# Patient Record
Sex: Male | Born: 1965 | Race: White | Hispanic: No | Smoking: Former smoker
Health system: Southern US, Community
[De-identification: ages and names within clinical notes are randomized; demographics above are authoritative.]

## PROBLEM LIST (undated history)

## (undated) ENCOUNTER — Emergency Department (HOSPITAL_COMMUNITY): Disposition: A | Discharge status: Home / Self Care | Payer: Medicare Other

## (undated) DIAGNOSIS — F419 Anxiety disorder, unspecified: Secondary | ICD-10-CM

## (undated) DIAGNOSIS — T148XXA Other injury of unspecified body region, initial encounter: Secondary | ICD-10-CM

## (undated) DIAGNOSIS — L02619 Cutaneous abscess of unspecified foot: Secondary | ICD-10-CM

## (undated) DIAGNOSIS — L03119 Cellulitis of unspecified part of limb: Secondary | ICD-10-CM

## (undated) DIAGNOSIS — Z87442 Personal history of urinary calculi: Secondary | ICD-10-CM

## (undated) DIAGNOSIS — F99 Mental disorder, not otherwise specified: Secondary | ICD-10-CM

## (undated) DIAGNOSIS — Z9289 Personal history of other medical treatment: Secondary | ICD-10-CM

## (undated) DIAGNOSIS — G709 Myoneural disorder, unspecified: Secondary | ICD-10-CM

## (undated) DIAGNOSIS — IMO0002 Reserved for concepts with insufficient information to code with codable children: Secondary | ICD-10-CM

## (undated) DIAGNOSIS — Z89421 Acquired absence of other right toe(s): Secondary | ICD-10-CM

## (undated) DIAGNOSIS — J189 Pneumonia, unspecified organism: Secondary | ICD-10-CM

## (undated) DIAGNOSIS — F1910 Other psychoactive substance abuse, uncomplicated: Secondary | ICD-10-CM

## (undated) DIAGNOSIS — F1010 Alcohol abuse, uncomplicated: Secondary | ICD-10-CM

## (undated) DIAGNOSIS — Z91199 Patient's noncompliance with other medical treatment and regimen due to unspecified reason: Secondary | ICD-10-CM

## (undated) DIAGNOSIS — L03115 Cellulitis of right lower limb: Secondary | ICD-10-CM

## (undated) DIAGNOSIS — K5900 Constipation, unspecified: Secondary | ICD-10-CM

## (undated) DIAGNOSIS — F329 Major depressive disorder, single episode, unspecified: Secondary | ICD-10-CM

## (undated) DIAGNOSIS — K08109 Complete loss of teeth, unspecified cause, unspecified class: Secondary | ICD-10-CM

## (undated) DIAGNOSIS — C801 Malignant (primary) neoplasm, unspecified: Secondary | ICD-10-CM

## (undated) DIAGNOSIS — L97509 Non-pressure chronic ulcer of other part of unspecified foot with unspecified severity: Secondary | ICD-10-CM

## (undated) DIAGNOSIS — M1990 Unspecified osteoarthritis, unspecified site: Secondary | ICD-10-CM

## (undated) DIAGNOSIS — Z972 Presence of dental prosthetic device (complete) (partial): Secondary | ICD-10-CM

## (undated) DIAGNOSIS — M869 Osteomyelitis, unspecified: Secondary | ICD-10-CM

## (undated) DIAGNOSIS — Z22322 Carrier or suspected carrier of Methicillin resistant Staphylococcus aureus: Secondary | ICD-10-CM

## (undated) DIAGNOSIS — I639 Cerebral infarction, unspecified: Secondary | ICD-10-CM

## (undated) DIAGNOSIS — Z973 Presence of spectacles and contact lenses: Secondary | ICD-10-CM

## (undated) DIAGNOSIS — F32A Depression, unspecified: Secondary | ICD-10-CM

## (undated) DIAGNOSIS — G8929 Other chronic pain: Secondary | ICD-10-CM

## (undated) DIAGNOSIS — D649 Anemia, unspecified: Secondary | ICD-10-CM

## (undated) DIAGNOSIS — E119 Type 2 diabetes mellitus without complications: Secondary | ICD-10-CM

## (undated) DIAGNOSIS — K219 Gastro-esophageal reflux disease without esophagitis: Secondary | ICD-10-CM

## (undated) DIAGNOSIS — E11621 Type 2 diabetes mellitus with foot ulcer: Secondary | ICD-10-CM

## (undated) DIAGNOSIS — Z9119 Patient's noncompliance with other medical treatment and regimen: Secondary | ICD-10-CM

## (undated) HISTORY — PX: TOTAL LARYNGECTOMY: SHX2543

## (undated) HISTORY — PX: MULTIPLE TOOTH EXTRACTIONS: SHX2053

## (undated) HISTORY — PX: THROAT SURGERY: SHX803

## (undated) HISTORY — PX: JOINT REPLACEMENT: SHX530

## (undated) HISTORY — PX: LUNG LOBECTOMY: SHX167

## (undated) HISTORY — DX: Cellulitis of right lower limb: L03.115

## (undated) HISTORY — DX: Osteomyelitis, unspecified: M86.9

## (undated) HISTORY — PX: KNEE ARTHROSCOPY: SHX127

## (undated) HISTORY — PX: TONGUE SURGERY: SHX810

---
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
## 2001-12-29 ENCOUNTER — Emergency Department (HOSPITAL_COMMUNITY): Admission: EM | Admit: 2001-12-29 | Discharge: 2001-12-29 | Payer: Self-pay | Admitting: Internal Medicine

## 2001-12-29 ENCOUNTER — Encounter: Payer: Self-pay | Admitting: Internal Medicine

## 2003-01-04 ENCOUNTER — Ambulatory Visit (HOSPITAL_COMMUNITY): Admission: RE | Admit: 2003-01-04 | Discharge: 2003-01-04 | Payer: Self-pay | Admitting: Preventative Medicine

## 2003-01-16 ENCOUNTER — Ambulatory Visit (HOSPITAL_COMMUNITY): Admission: RE | Admit: 2003-01-16 | Discharge: 2003-01-16 | Payer: Self-pay | Admitting: Orthopaedic Surgery

## 2004-01-20 HISTORY — PX: TOTAL KNEE ARTHROPLASTY: SHX125

## 2004-05-19 ENCOUNTER — Ambulatory Visit (HOSPITAL_COMMUNITY): Admission: RE | Admit: 2004-05-19 | Discharge: 2004-05-19 | Payer: Self-pay | Admitting: Orthopaedic Surgery

## 2004-08-05 ENCOUNTER — Encounter (HOSPITAL_COMMUNITY): Admission: RE | Admit: 2004-08-05 | Discharge: 2004-09-04 | Payer: Self-pay | Admitting: Orthopedic Surgery

## 2005-01-02 ENCOUNTER — Emergency Department (HOSPITAL_COMMUNITY): Admission: EM | Admit: 2005-01-02 | Discharge: 2005-01-02 | Payer: Self-pay | Admitting: Emergency Medicine

## 2005-01-06 ENCOUNTER — Ambulatory Visit (HOSPITAL_COMMUNITY): Admission: RE | Admit: 2005-01-06 | Discharge: 2005-01-06 | Payer: Self-pay | Admitting: Orthopaedic Surgery

## 2005-05-30 IMAGING — MR MR [PERSON_NAME] LOW JT W/O CM*L*
9 series · 40 of 40 positions shown · non-contrast
Comparison: none

CLINICAL DATA: Pain, injury.
 MRI LEFT KNEE WITHOUT CONTRAST
 Multiplanar non-contrast MR imaging of the right knee without radiographs for correlation.  Motion artifacts degrade image quality on multiple sequences, most notably coronal proton density.  Small joint effusion with no evidence of Baker?s cyst.  Diffuse articular cartilage thinning and irregularity, particularly at the medial and lateral compartments, compatible with osteoarthritis.  A small calcified loose body is seen anteriorly at lateral compartment, 1.7 x 1.2 cm x 1.6 cm in size.  Particular spur formation noted at the posterior and lateral aspects of the lateral compartment.  Question additional partially calcified body at the posterior medial aspect of the previously noted large anterior loose body at lateral compartment.  Potentially, this could represent a meniscal fragment.  
 Medial meniscus intact with degenerative intrameniscal signal noted in the posterior horn.  Posterior horn of lateral meniscus is identified but subluxed from the joint space.  The body and anterior horn are not identified and there is question of a flipped fragment in the posterior aspect of the intercondylar notch.  Marrow edema noted centrally in the tibia.  Patellar articular cartilage preserved.  Cruciate ligaments intact.  Lateral collateral ligament questionably torn at its tibial insertion.  Medial collateral ligaments intact.  Extensor mechanism unremarkable.  
 IMPRESSION
 1.  Advanced osteoarthritic changes, left knee, most severe at lateral compartment.  Torn lateral meniscus with absent anterior horn and body, question flipped fragment into intercondylar notch.  Two probable calcified loose bodies anterior aspect of the lateral compartment.  Probably torn lateral collateral ligament distally.  
 2.  Limited exam secondary to patient motion despite repeat imaging.

[Series 1: loc · axial · 6.0mm · 1.02mm/px · z∈[-16,+130]mm · 4 of 15 slices shown]
[im 1/15]
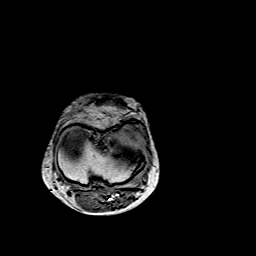
[im 5/15]
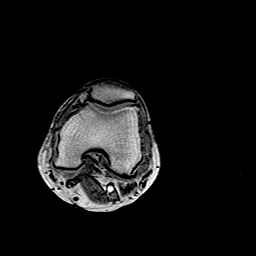
[im 10/15]
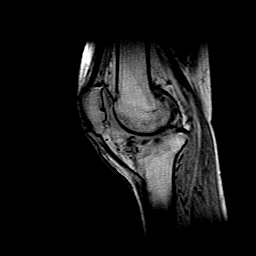
[im 15/15]
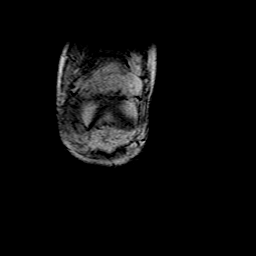

[Series 2: PD fat-sat · axial · 4.0mm · 0.63mm/px · z∈[-43,+47]mm · 5 of 19 slices shown (1 of 3)]
[im 1/19]
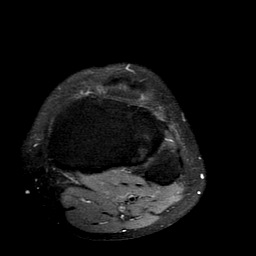
[im 5/19]
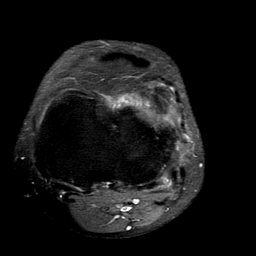
[im 10/19]
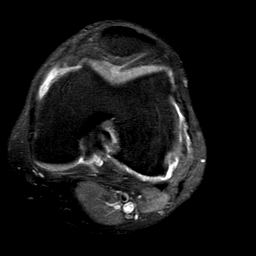
[im 14/19]
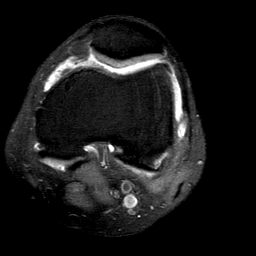
[im 19/19]
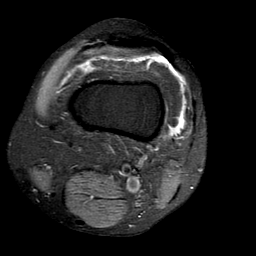

[Series 3: PD · sagittal · 3.0mm · 0.62mm/px · 2 of 12 slices shown (1 of 2)]
[im 1/12]
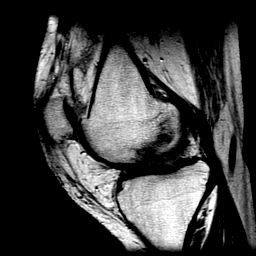
[im 12/12]
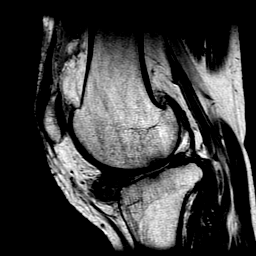

[Series 4: PD · sagittal · 4.0mm · 0.63mm/px · 9 of 44 slices shown (2 of 2)]
[im 1/44]
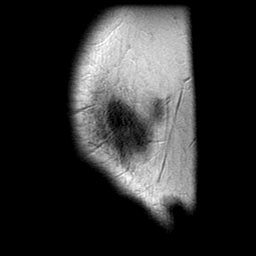
[im 6/44]
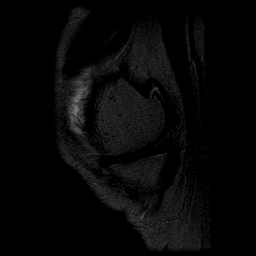
[im 11/44]
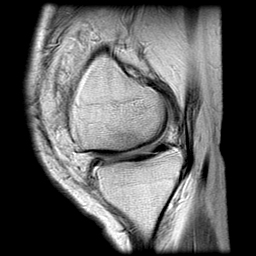
[im 17/44]
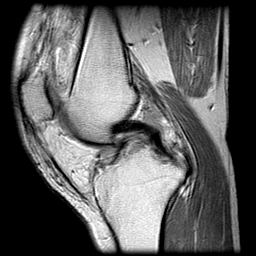
[im 22/44]
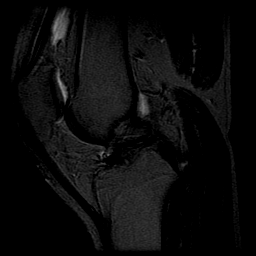
[im 27/44]
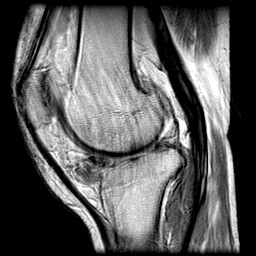
[im 33/44]
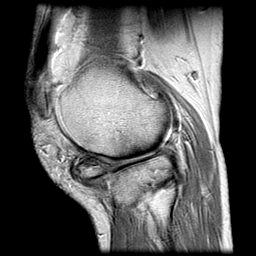
[im 38/44]
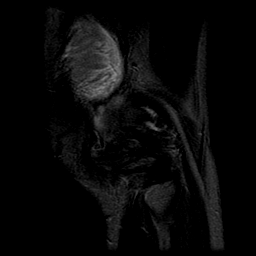
[im 44/44]
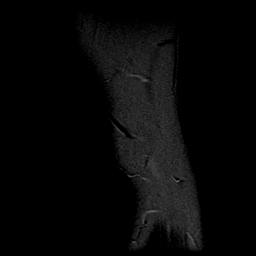

[Series 5: T2 fat-sat · coronal · 4.0mm · 0.63mm/px · 4 of 21 slices shown]
[im 1/21]
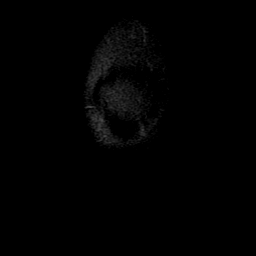
[im 7/21]
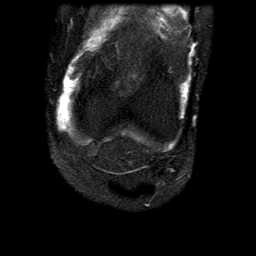
[im 14/21]
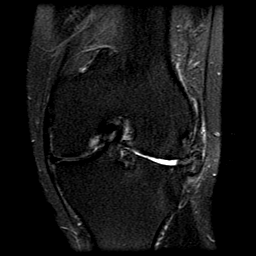
[im 21/21]
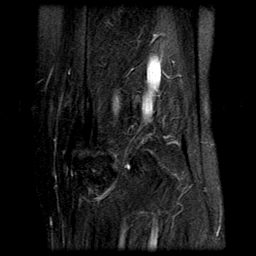

[Series 6: PD fat-sat · coronal · 4.0mm · 0.63mm/px · 4 of 21 slices shown (2 of 3)]
[im 1/21]
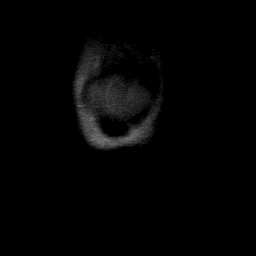
[im 7/21]
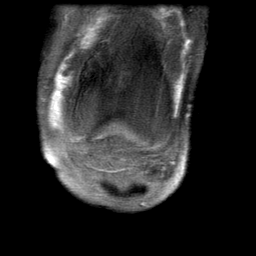
[im 14/21]
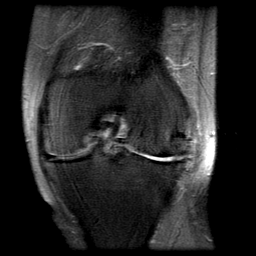
[im 21/21]
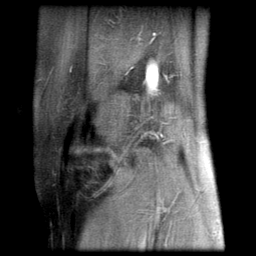

[Series 7: PD fat-sat · coronal · 4.0mm · 0.63mm/px · 4 of 21 slices shown (3 of 3)]
[im 1/21]
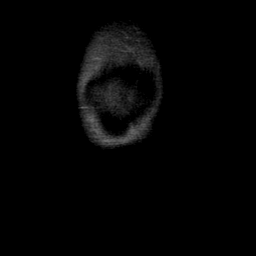
[im 7/21]
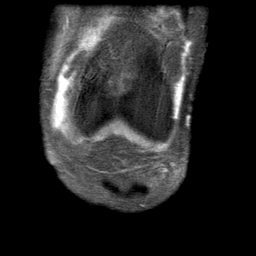
[im 14/21]
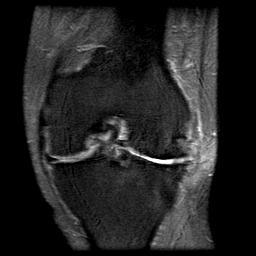
[im 21/21]
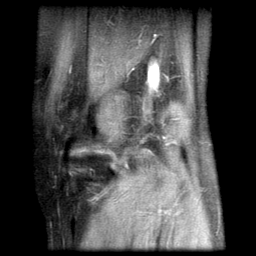

[Series 8: T1 · coronal · 4.0mm · 0.63mm/px · 4 of 21 slices shown (1 of 2)]
[im 1/21]
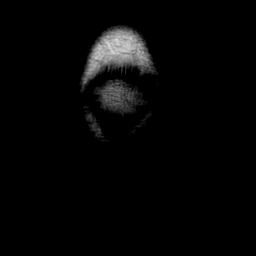
[im 7/21]
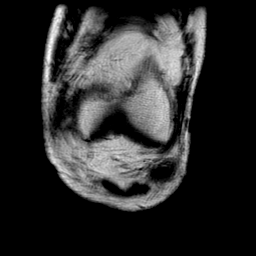
[im 14/21]
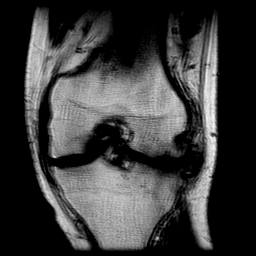
[im 21/21]
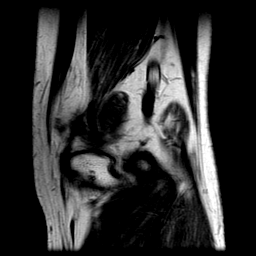

[Series 9: T1 · coronal · 4.0mm · 0.63mm/px · 4 of 21 slices shown (2 of 2)]
[im 1/21]
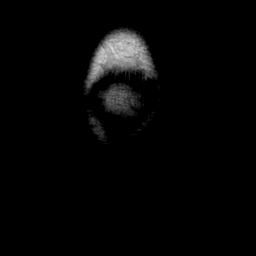
[im 7/21]
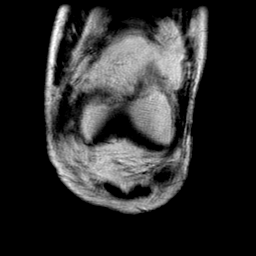
[im 14/21]
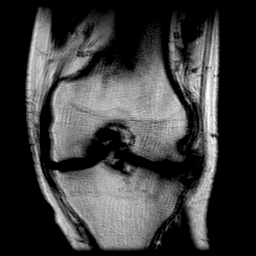
[im 21/21]
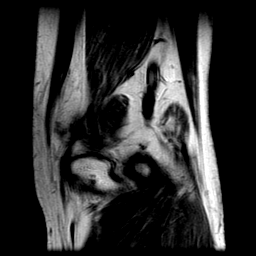

[40 of 40 positions shown; findings below may reference images not displayed]

## 2005-06-25 ENCOUNTER — Ambulatory Visit: Payer: Self-pay | Admitting: Cardiology

## 2005-07-08 ENCOUNTER — Inpatient Hospital Stay (HOSPITAL_COMMUNITY): Admission: AD | Admit: 2005-07-08 | Discharge: 2005-07-15 | Payer: Self-pay | Admitting: Thoracic Surgery

## 2005-07-09 ENCOUNTER — Encounter (INDEPENDENT_AMBULATORY_CARE_PROVIDER_SITE_OTHER): Payer: Self-pay | Admitting: Specialist

## 2005-07-21 ENCOUNTER — Encounter: Admission: RE | Admit: 2005-07-21 | Discharge: 2005-07-21 | Payer: Self-pay | Admitting: Thoracic Surgery

## 2005-08-12 ENCOUNTER — Encounter: Admission: RE | Admit: 2005-08-12 | Discharge: 2005-08-12 | Payer: Self-pay | Admitting: Thoracic Surgery

## 2005-10-14 ENCOUNTER — Encounter: Admission: RE | Admit: 2005-10-14 | Discharge: 2005-10-14 | Payer: Self-pay | Admitting: Thoracic Surgery

## 2005-11-17 ENCOUNTER — Ambulatory Visit (HOSPITAL_COMMUNITY): Admission: RE | Admit: 2005-11-17 | Discharge: 2005-11-17 | Payer: Self-pay | Admitting: Orthopaedic Surgery

## 2005-11-17 ENCOUNTER — Encounter (INDEPENDENT_AMBULATORY_CARE_PROVIDER_SITE_OTHER): Payer: Self-pay | Admitting: *Deleted

## 2006-01-19 DIAGNOSIS — I639 Cerebral infarction, unspecified: Secondary | ICD-10-CM

## 2006-01-19 HISTORY — DX: Cerebral infarction, unspecified: I63.9

## 2006-01-19 HISTORY — PX: TOTAL HIP ARTHROPLASTY: SHX124

## 2006-01-19 HISTORY — PX: REVISION TOTAL HIP ARTHROPLASTY: SHX766

## 2006-06-11 ENCOUNTER — Ambulatory Visit: Payer: Self-pay | Admitting: Orthopedic Surgery

## 2006-06-11 ENCOUNTER — Emergency Department (HOSPITAL_COMMUNITY): Admission: EM | Admit: 2006-06-11 | Discharge: 2006-06-11 | Payer: Self-pay | Admitting: Emergency Medicine

## 2006-06-18 ENCOUNTER — Ambulatory Visit (HOSPITAL_BASED_OUTPATIENT_CLINIC_OR_DEPARTMENT_OTHER): Admission: RE | Admit: 2006-06-18 | Discharge: 2006-06-18 | Payer: Self-pay | Admitting: Otolaryngology

## 2006-06-18 ENCOUNTER — Encounter (INDEPENDENT_AMBULATORY_CARE_PROVIDER_SITE_OTHER): Payer: Self-pay | Admitting: Otolaryngology

## 2006-07-10 ENCOUNTER — Emergency Department (HOSPITAL_COMMUNITY): Admission: EM | Admit: 2006-07-10 | Discharge: 2006-07-11 | Payer: Self-pay | Admitting: Emergency Medicine

## 2006-08-16 ENCOUNTER — Emergency Department (HOSPITAL_COMMUNITY): Admission: EM | Admit: 2006-08-16 | Discharge: 2006-08-16 | Payer: Self-pay | Admitting: Emergency Medicine

## 2006-08-16 ENCOUNTER — Inpatient Hospital Stay (HOSPITAL_COMMUNITY): Admission: RE | Admit: 2006-08-16 | Discharge: 2006-08-20 | Payer: Self-pay | Admitting: Psychiatry

## 2006-08-16 ENCOUNTER — Ambulatory Visit: Payer: Self-pay | Admitting: Psychiatry

## 2006-08-31 ENCOUNTER — Emergency Department (HOSPITAL_COMMUNITY): Admission: EM | Admit: 2006-08-31 | Discharge: 2006-08-31 | Payer: Self-pay | Admitting: Emergency Medicine

## 2007-02-01 IMAGING — MR MR [PERSON_NAME] LOW JT W/O CM*L*
6 of 8 series · 28 of 40 positions shown · non-contrast
Comparison: none

CLINICAL DATA: Left knee pain and swelling.  History of prior arthroscopic surgery approximately 25 years ago.  Reportedly the patient fell off of a ladder approximately 10 days ago and has medial and lateral joint line pain.
MRI OF THE LEFT KNEE WITHOUT CONTRAST MEDIA:
Standard high field strength images were acquired and are compared to the 01/04/03 scan from [HOSPITAL].
Large knee joint effusion.  Lobulated collection of fluid in the popliteal space just posterior to the PCL.  This is not the typical location of a Baker's cyst.  Severe cartilage narrowing of the lateral tibiofemoral compartment with subchondral osseous changes including sclerosis.  Slight bony irregularity and prominent ?kissing? osteophytes laterally.  Irregularity of the base of the tibial eminence raises the question of injury at that site.  However, a similar appearance was noted previously.  Previous images were somewhat image-degraded as are some of the images today, therefore it is difficult to make a direct one-to-one comparison.  I suspect that the patient had a prior fracture of the base of the anterior tibial spine however, I am concerned that there is a greater degree of marrow edema at that site on today?s examination raising the question of a recent bony injury as well.  
Findings compatible with medial collateral ligament sprain and possibly partial tear at the site of the femoral attachment as there is a small amount of marrow edema of the femur at that site. The lateral collateral ligament is intact.  The fibular collateral ligament is draped over prominent laterally projecting osteophytes off of the femur and tibia.  There are extensive degenerative osteoarthritic changes, particularly in the tibiofemoral compartment with marked cartilage thinning and subchondral bony changes compatible with eburnation.  Degenerative patellofemoral joint changes.  The ACL is intact but there is somewhat attenuated as noted previously.  The PCL appears intact.Suggestion of a subtle contour defect in the anterior margin of the medial aspect of the posterior horn of the medial meniscus which was noted previously.  This probably does not represent an acute tear.  The lateral meniscus is quite diminutive appearing .  The findings in part are likely due to prior meniscectomy.  An apparent remnant of the anterior horn appears to be present.  Again appreciated are findings of an ossified loose body in the anterior aspect of the lateral compartment adjacent to the infrapatellar fat pad.  Surrounding rind of soft tissue material may represent pannus-like reaction.                                                                                                                 When compared to the prior examination, there is a greater degree of marrow signal alteration of the distal femur and proximal tibia, mainly the lateral femoral metaphysis and condyle and proximal tibial metaphysis extending up to the articular eminence.  These would be compatible with bone marrow contusions/bone bruises.  Irregularity involving the anterior aspect of the tibial eminence is noted and was appreciated previously as well.  No definite acute fracture at that site.

[Series 3: PD fat-sat · axial · 4.0mm · 0.36mm/px · z∈[-73,+37]mm · 3 of 24 slices shown (1 of 2)]
[im 1/24]
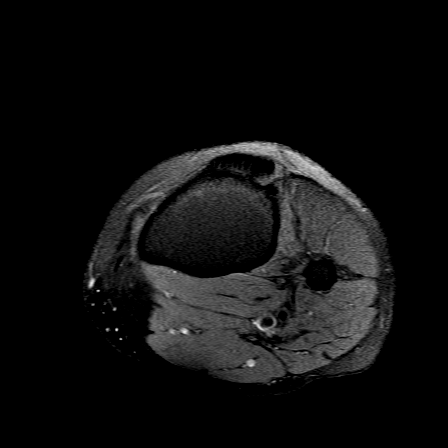
[im 12/24]
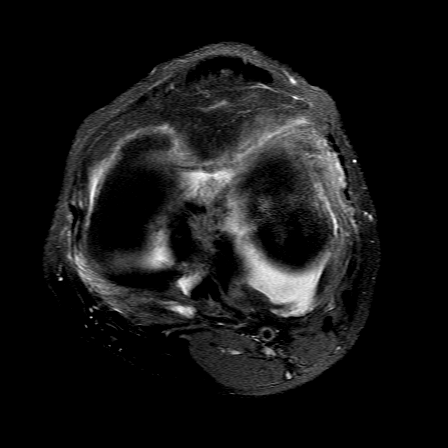
[im 24/24]
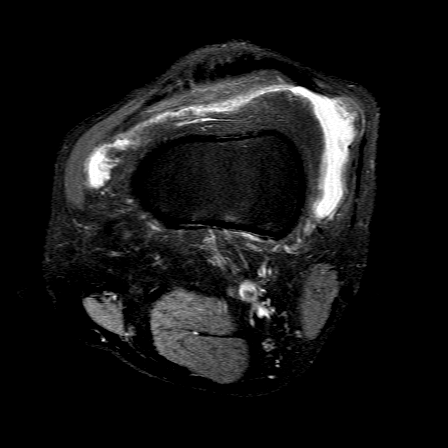

[Series 4: T1 · coronal · 3.0mm · 0.47mm/px · 6 of 36 slices shown]
[im 1/36]
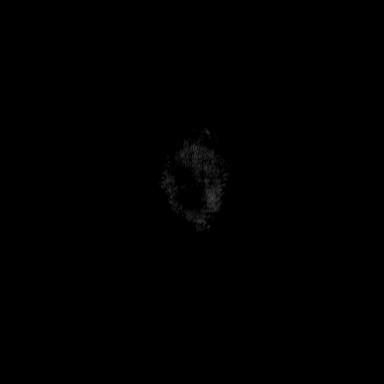
[im 8/36]
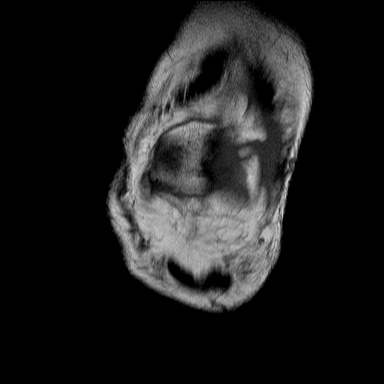
[im 15/36]
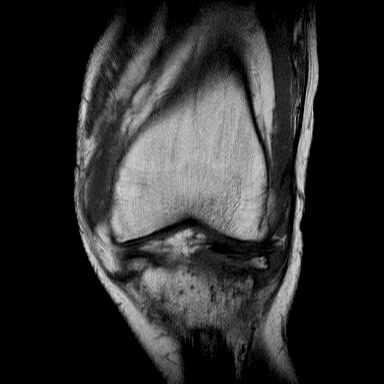
[im 22/36]
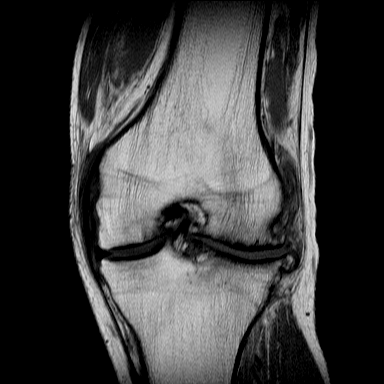
[im 29/36]
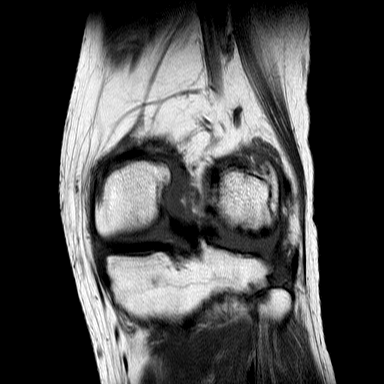
[im 36/36]
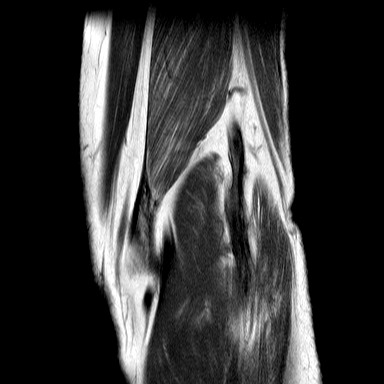

[Series 5: PD fat-sat · coronal · 2.5mm · 0.47mm/px · 7 of 42 slices shown (2 of 2)]
[im 1/42]
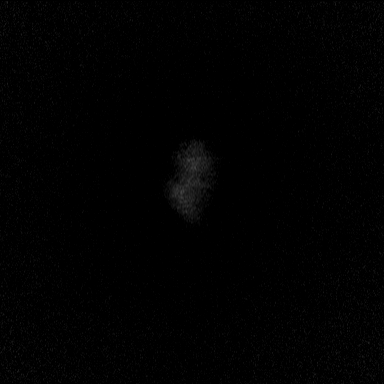
[im 7/42]
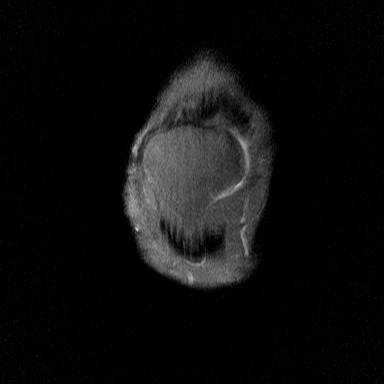
[im 14/42]
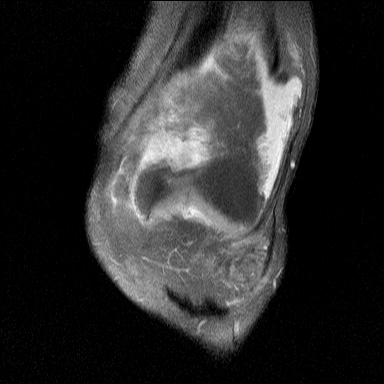
[im 21/42]
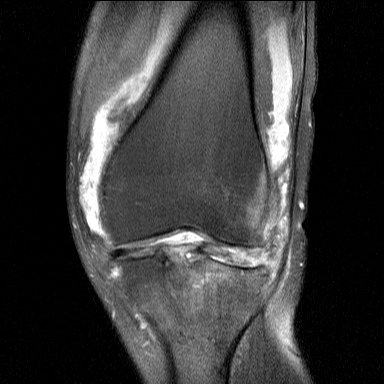
[im 28/42]
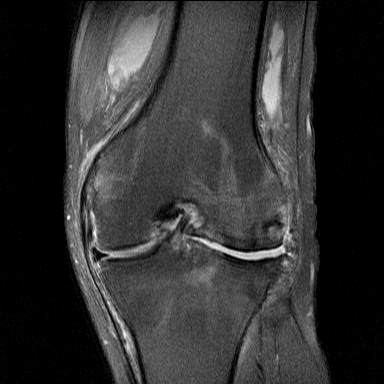
[im 35/42]
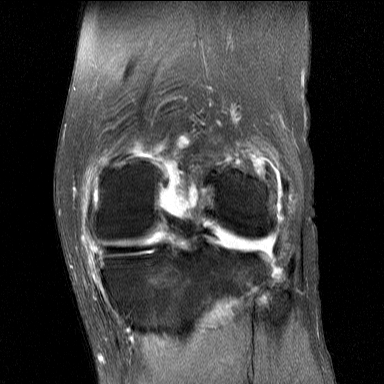
[im 42/42]
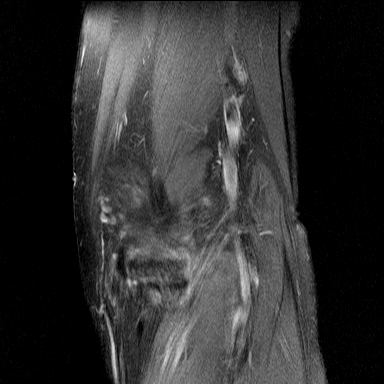

[Series 6: T2 fat-sat · coronal · 3.0mm · 0.47mm/px · 6 of 36 slices shown]
[im 1/36]
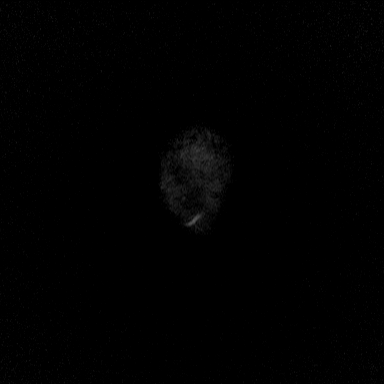
[im 8/36]
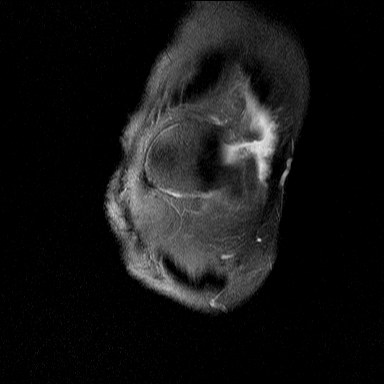
[im 15/36]
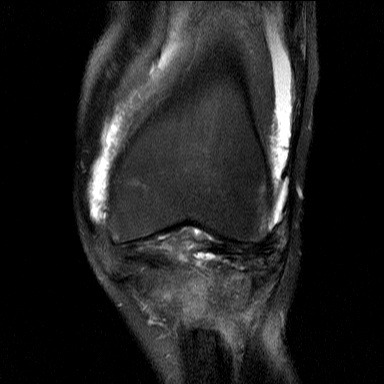
[im 22/36]
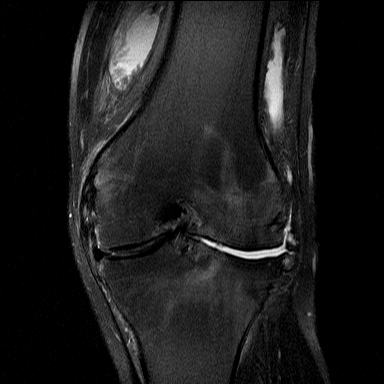
[im 29/36]
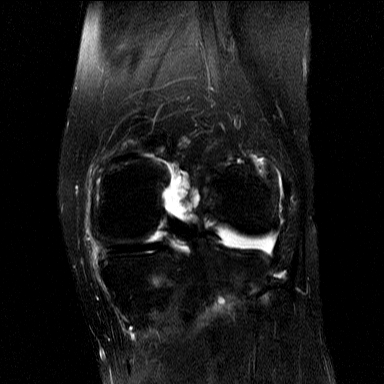
[im 36/36]
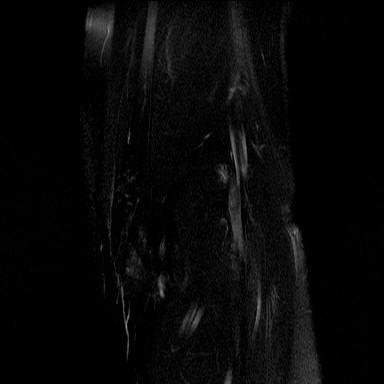

[Series 7: se pdfs sag · sagittal · 4.0mm · 0.59mm/px · 5 of 28 slices shown]
[im 1/28]
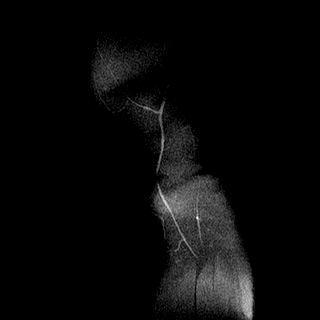
[im 7/28]
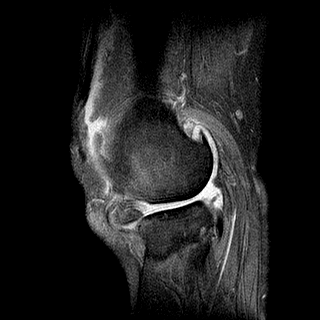
[im 14/28]
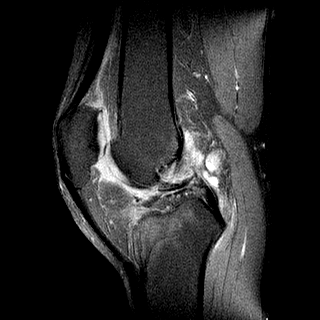
[im 21/28]
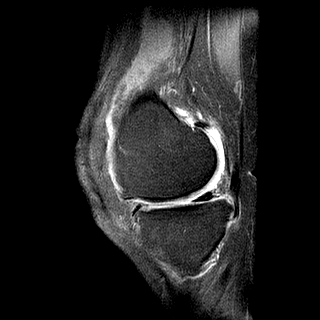
[im 28/28]
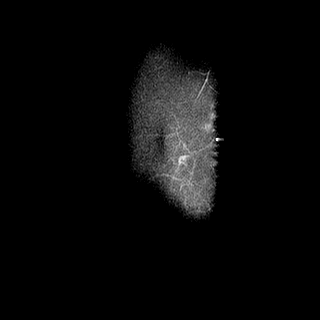

[Series 8: pdfs axial repeat · axial · 4.0mm · 0.24mm/px · 1 of 24 slices shown]
[im 1/24]
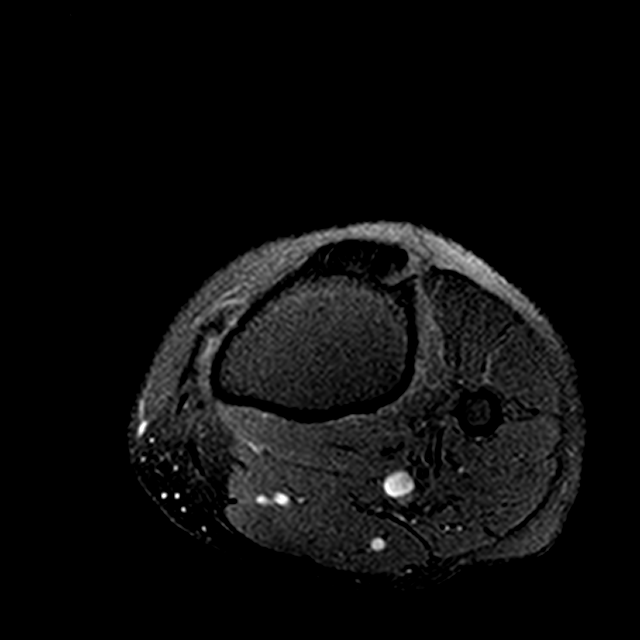

[28 of 40 positions shown; findings below may reference images not displayed]

IMPRESSION: Large knee joint effusion.
Extensive degenerative osteoarthritic changes.
Extensive lateral meniscal changes most likely due to prior subtotal meniscectomy.  It is difficult to rule out acute injury to the lateral meniscus with certainty.  See comments above concerning the medial meniscus.
MCL sprain/partial tear of proximal MCL.
Findings compatible with bone bruise/marrow contusions--see comments above.

## 2007-05-29 IMAGING — CR DG LUMBAR SPINE COMPLETE 4+V
5 series · 5 of 5 positions shown · non-contrast
Comparison: none

CLINICAL DATA: Patient fell from a ladder and now has posterior right hip pain, severe bruising, right lower back pain.  No previous injury. Previous injury of right knee.  Some swelling of the knee.
 LUMBOSACRAL SPINE ? 5   VIEW:

[view not recorded (1 of 5)]
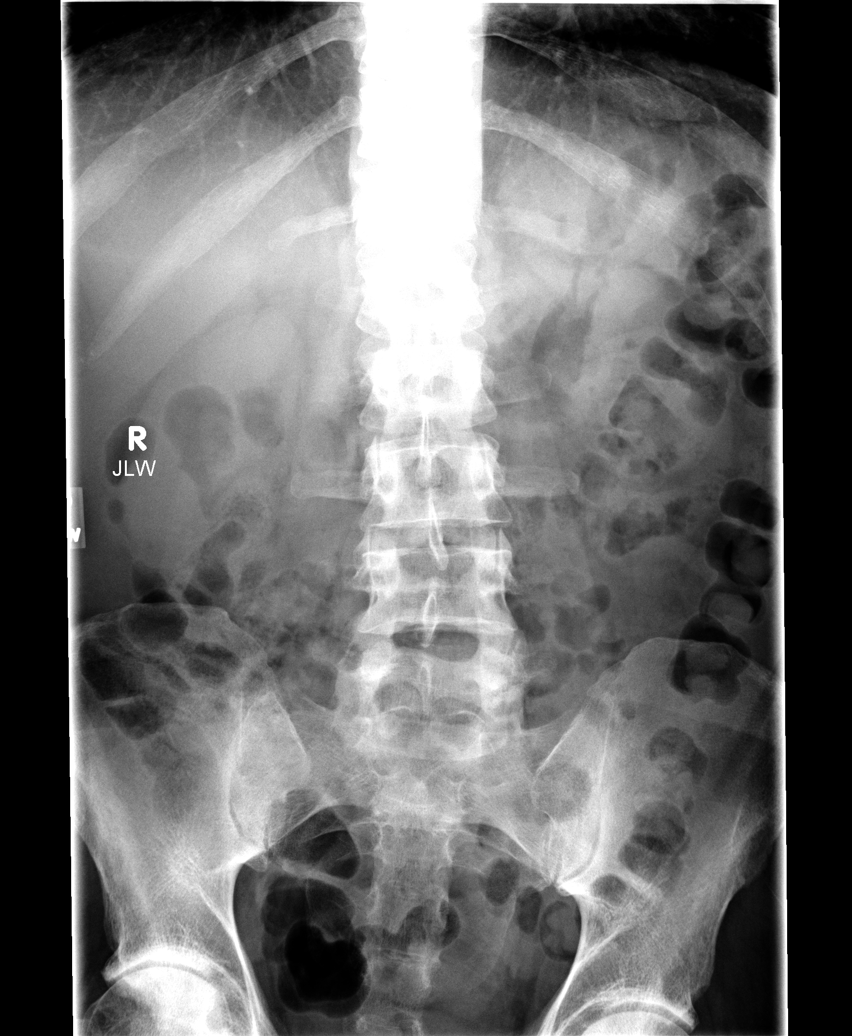

[view not recorded (2 of 5)]
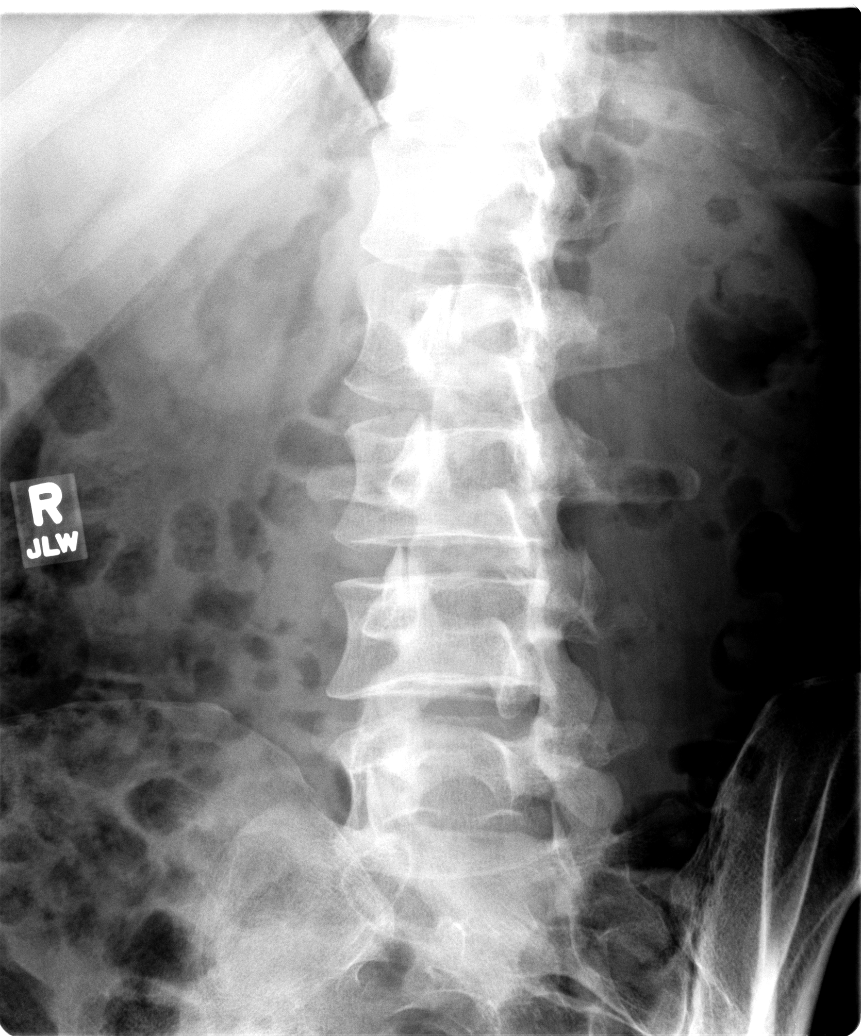

[view not recorded (3 of 5)]
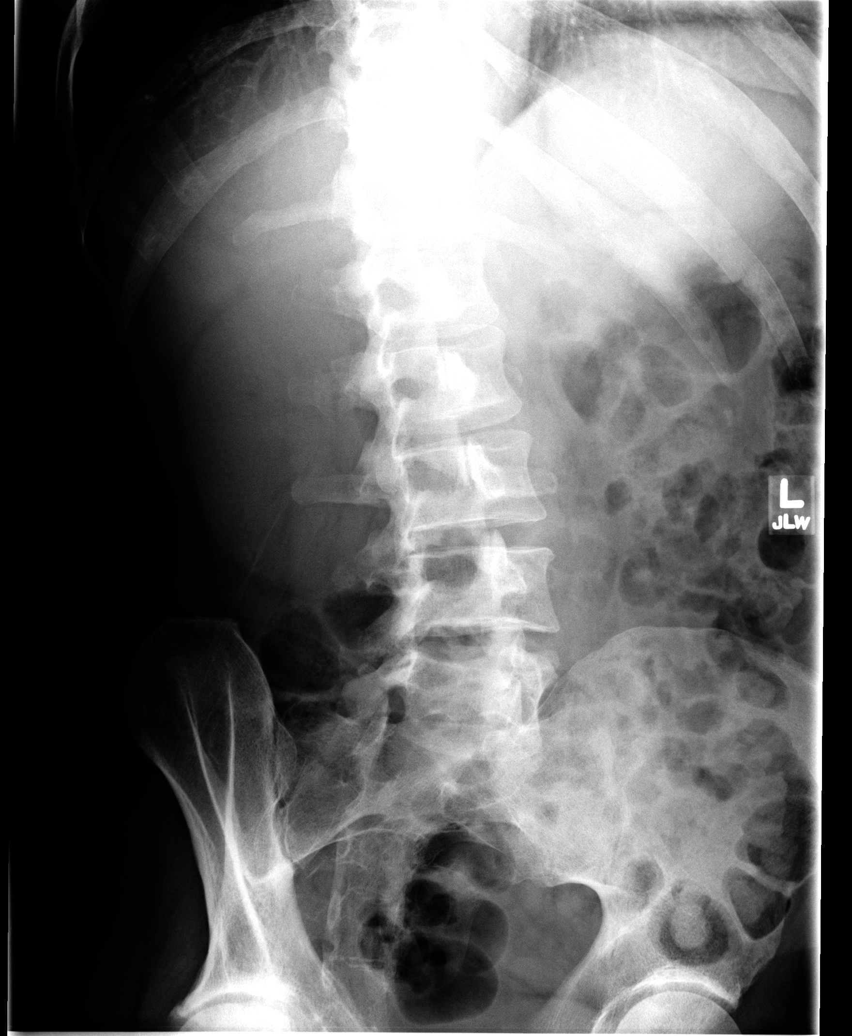

[view not recorded (4 of 5)]
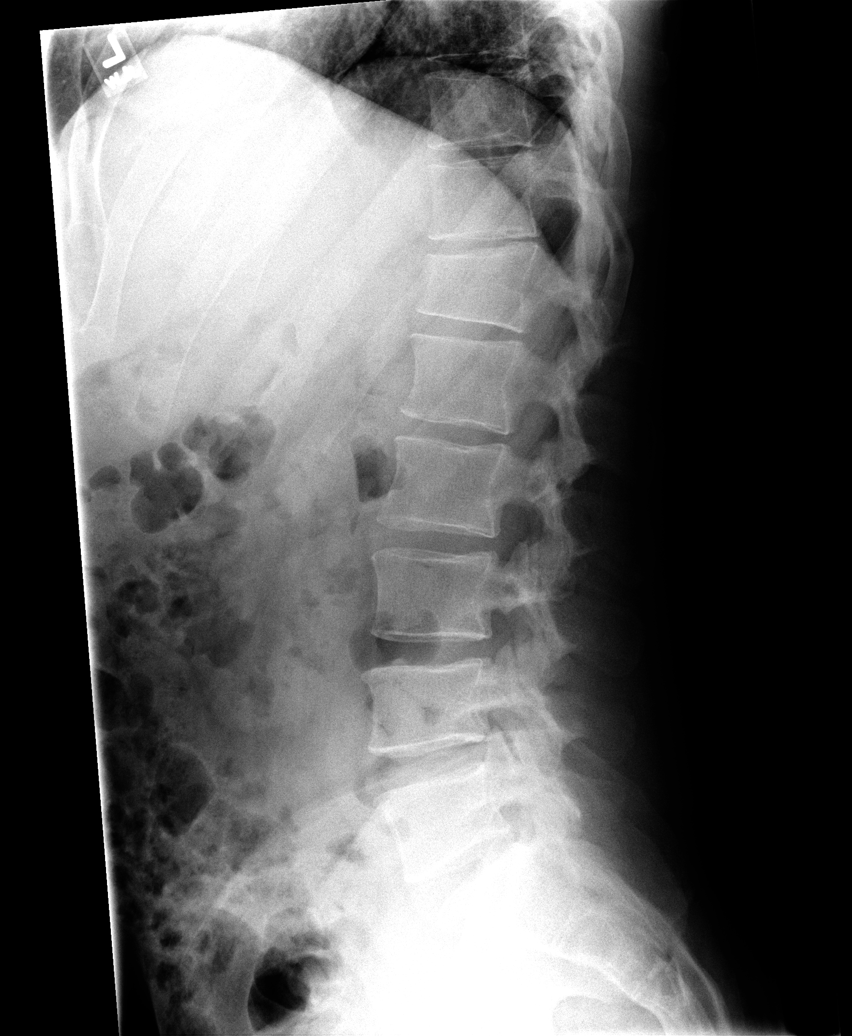

[view not recorded (5 of 5)]
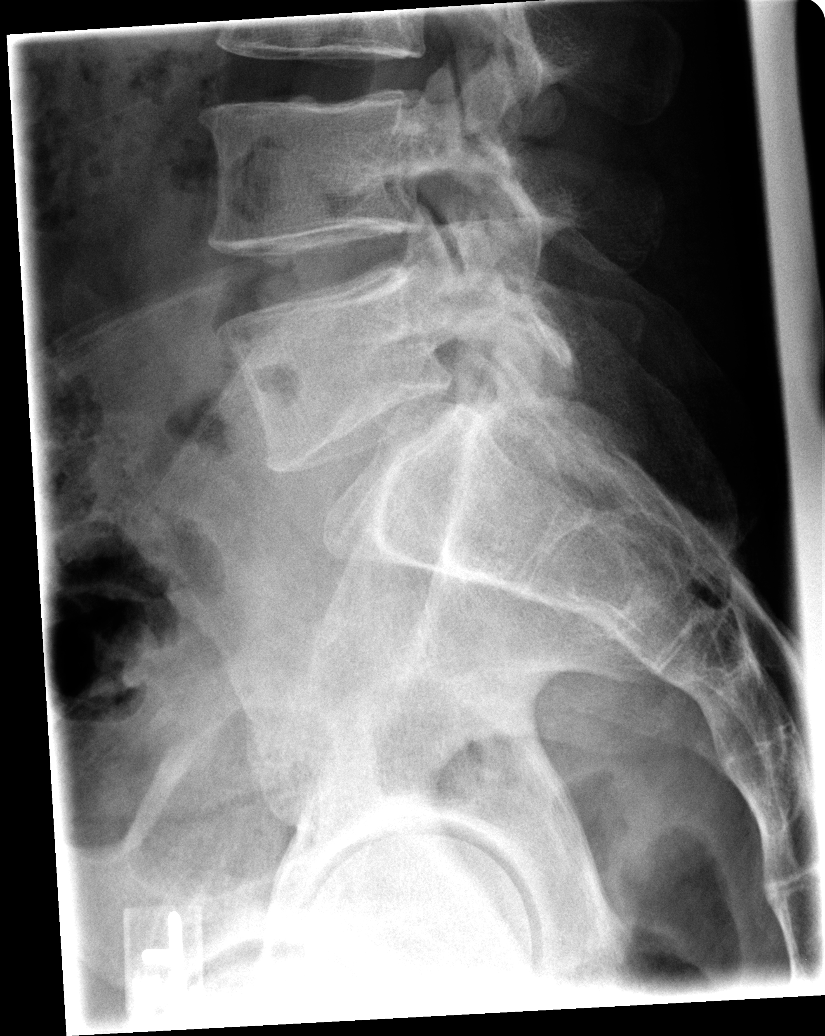

[5 of 5 positions shown; findings below may reference images not displayed]

FINDINGS: Lumbosacral spine views show no fracture or dislocation of the lumbar spine. Intervertebral disk spaces and posterior elements are well maintained.  There is anterior narrowing of T-12 of approximately 20% of uncertain age.  Posterior elements appear to be intact at all levels.  Soft tissues appear normal as far as can be seen.
IMPRESSION: 20% anterior narrowing T-12. Questionable age. 
 RIGHT KNEE ? 4 VIEW:
FINDINGS: AP lateral and both oblique views of the right knee are made without previous films for comparison and show an area of sclerosis associated with the lateral tibial plateau and a minimal irregularity along the anterior aspect of the tibial plateau on the lateral view that could be associated with a minimal subchondral fracture of the lateral tibial plateau.  There is a moderate sized joint effusion and there are old degenerative hypertrophic arthritic changes of the medial and lateral aspects of the knee.
IMPRESSION: Sclerosis associated with the lateral tibial plateau.  I cannot rule out a minimal tibial plateau subchondral fracture.  There is also minimal irregularity associated with the anterior aspect of the lateral view of the tibia and a joint effusion. There are old degenerative hypertrophic arthritic changes associated with both medial and lateral aspects of the knee, more prominent laterally.
IMPRESSION: Sclerosis associated with the medial tibial plateau. Possibility of a subchondral fracture of the lateral tibial plateau should be correlated clinically and the possibility of a CT scan should be considered.
 RIGHT HIP ? 3 VIEW:
FINDINGS: AP view of the pelvis and an AP and lateral view of the right hip are made and show no evidence of acute fracture, dislocation or foreign body. Sacroiliac joints appear normal.
IMPRESSION: Normal right hip.

## 2007-06-02 IMAGING — CT CT EXTREM LOW W/O CM*R*
4 of 7 series · 15 of 33 positions shown, 16 images · IV contrast (agent unspecified)
Comparison: None available.   MRI of the right knee reviewed.

CLINICAL DATA: Patient status-post fall from a ladder on 01/02/05 with questionable fracture.  
CT OF THE LEFT KNEE WITHOUT CONTRAST:
TECHNIQUE: Multidetector CT imaging was performed according to the standard protocol without intravenous contrast administration.  Multiplanar CT image reconstructions were also generated.

[Series 8329: — · axial · 0.40mm/px · z∈[+960,+1050]mm · 4 of 251 slices shown, 5 images (1 of 4)]
[im 51/251  soft-tissue]
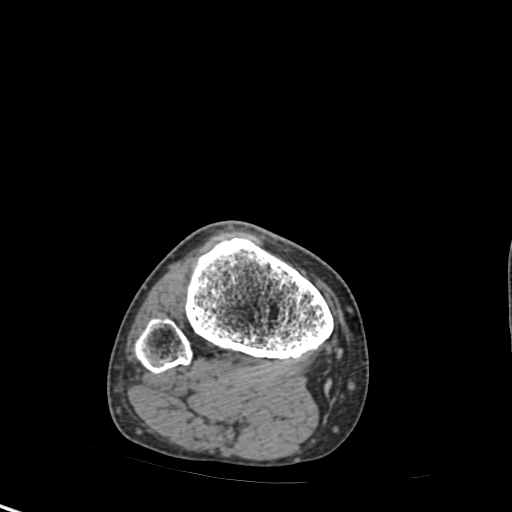
[im 51/251  bone]
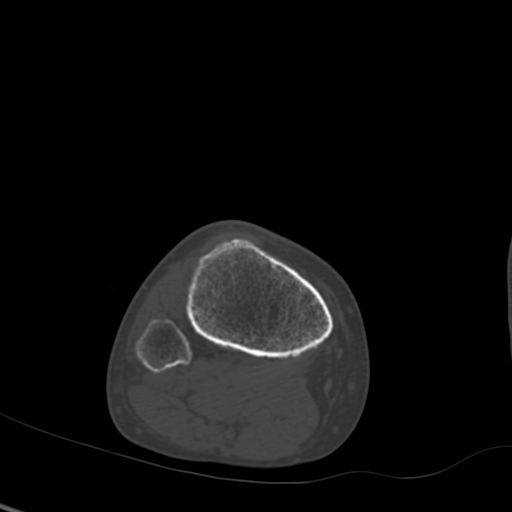
[im 101/251  bone]
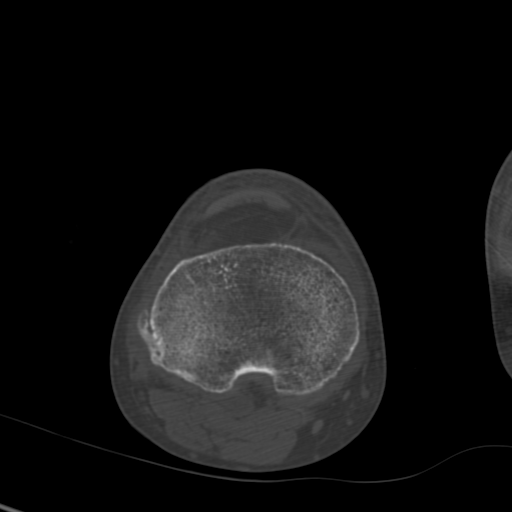
[im 151/251  bone]
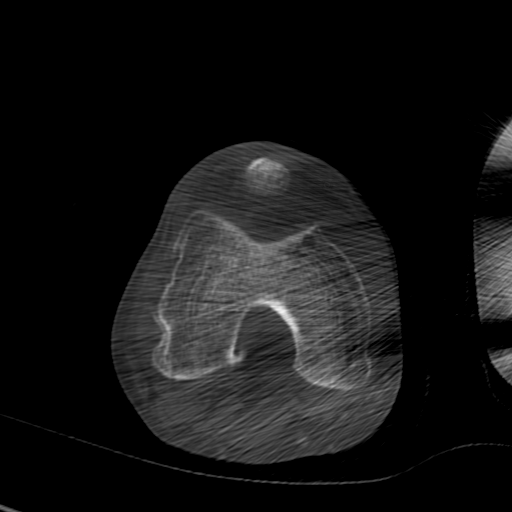
[im 201/251  bone]
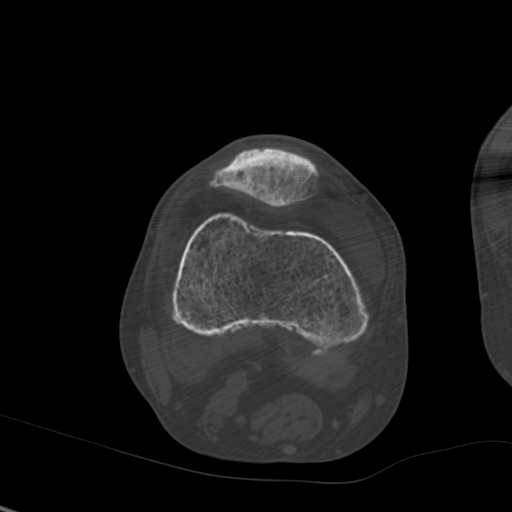

[Series 8336: — · axial · 0.40mm/px · z∈[+960,+1050]mm · 4 of 251 slices shown (2 of 4)]
[im 51/251  bone]
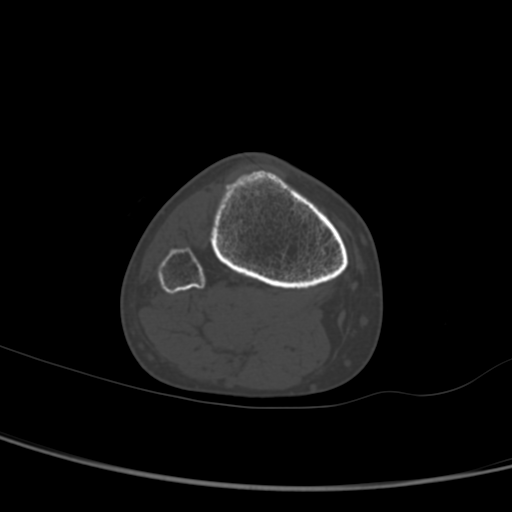
[im 101/251  bone]
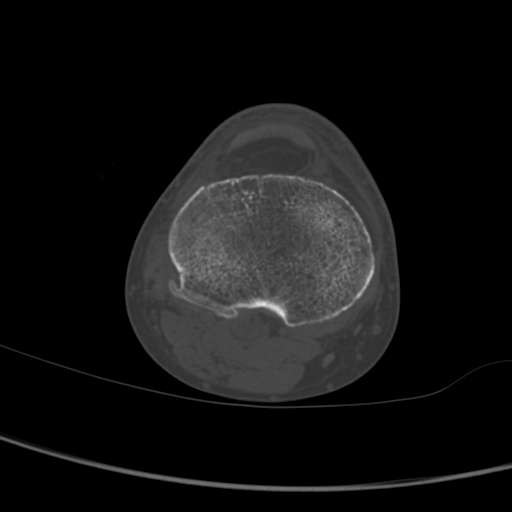
[im 151/251  bone]
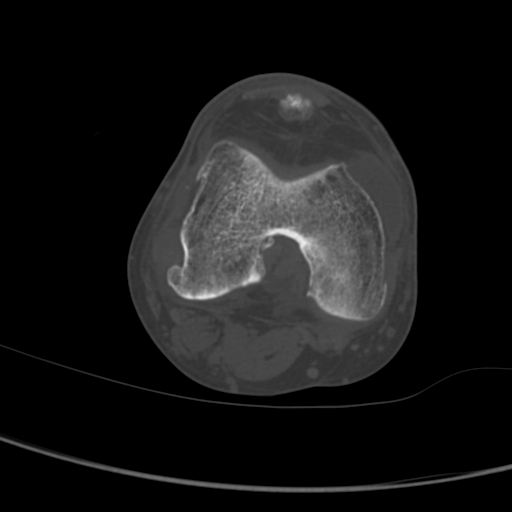
[im 201/251  bone]
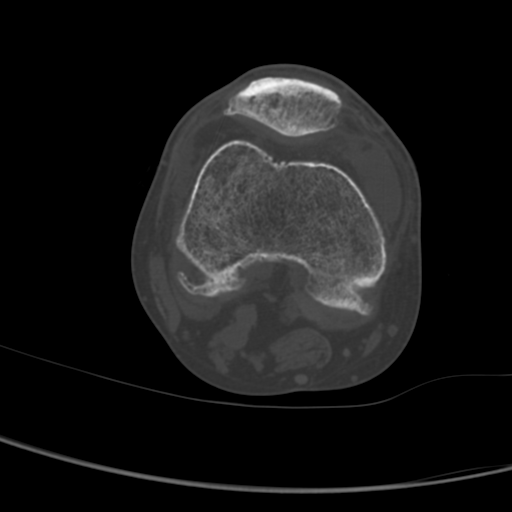

[— · coronal · 0.40mm/px · 2 of 99 slices shown (3 of 4)]
[im 33/99  bone]
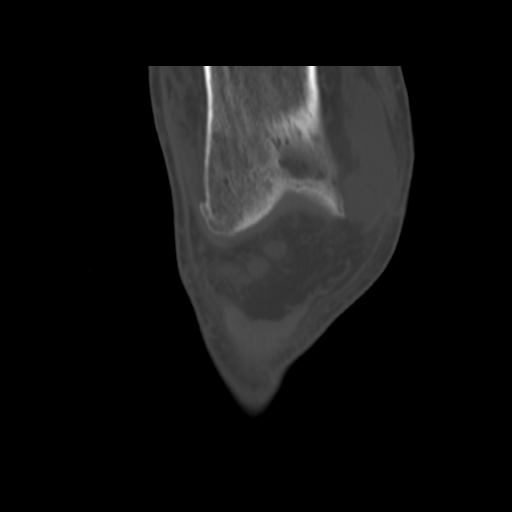
[im 66/99  bone]
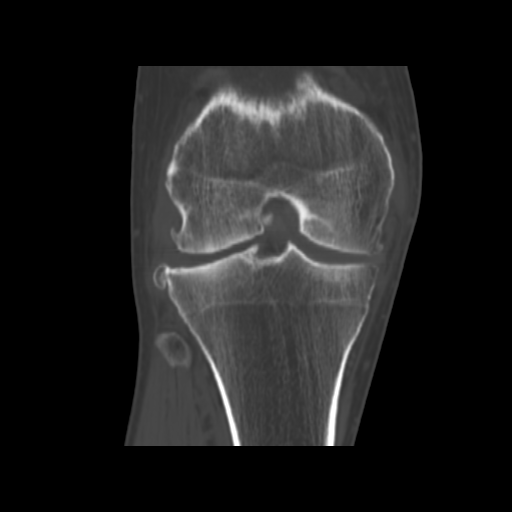

[— · sagittal · 0.40mm/px · 5 of 99 slices shown (4 of 4)]
[im 15/99  bone]
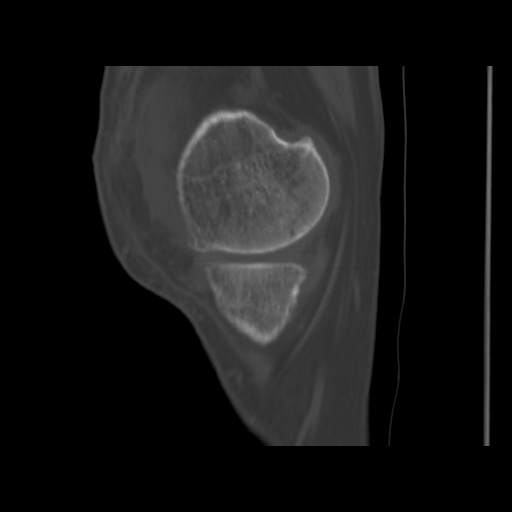
[im 29/99  bone]
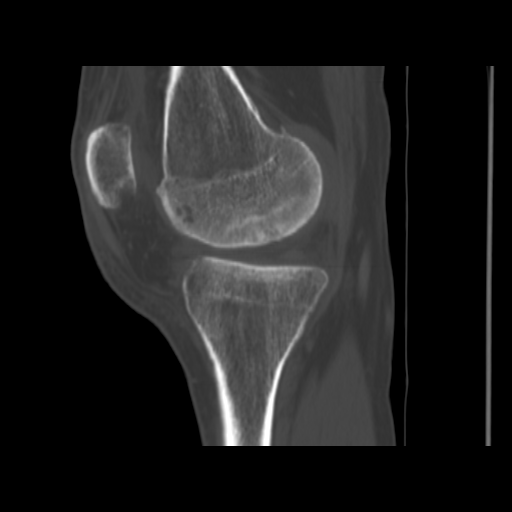
[im 43/99  bone]
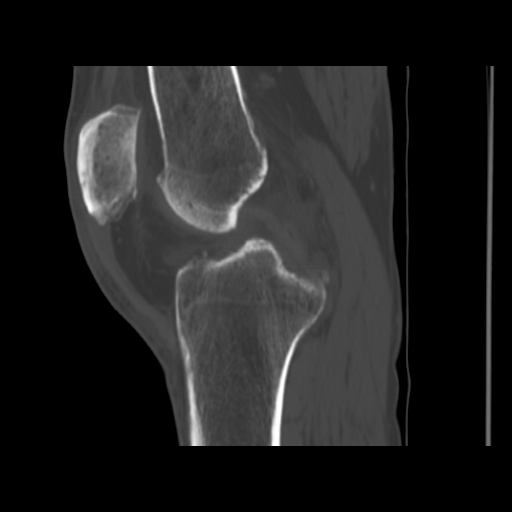
[im 57/99  bone]
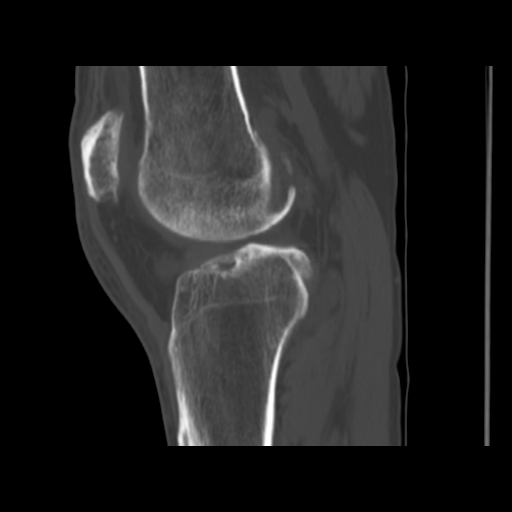
[im 71/99  bone]
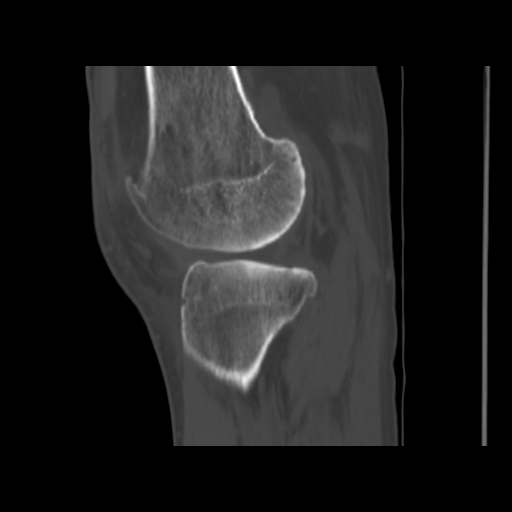

[15 of 33 positions shown; findings below may reference images not displayed]

FINDINGS: The patient has markedly advanced degenerative disc disease with prominent osteophytes in all three compartments with the lateral compartment appearing most severely affected. There is subchondral sclerosis in the lateral compartment.  A small area of cortical prominence in the metaphysis is identified. A similar area of prominence is seen on the patient?s knee MRI and this is felt to be normal for this patient. Small joint effusion is present.
IMPRESSION: Negative for fracture with markedly advanced for age degenerative disease.   Small joint effusion is present.

## 2007-09-17 IMAGING — CR DG KNEE COMPLETE 4+V*R*
4 series · 4 of 4 positions shown · non-contrast
Comparison: none

CLINICAL DATA: Patient fell from a ladder and now has posterior right hip pain, severe bruising, right lower back pain.  No previous injury. Previous injury of right knee.  Some swelling of the knee.
 LUMBOSACRAL SPINE ? 5   VIEW:

[view not recorded (1 of 4)]
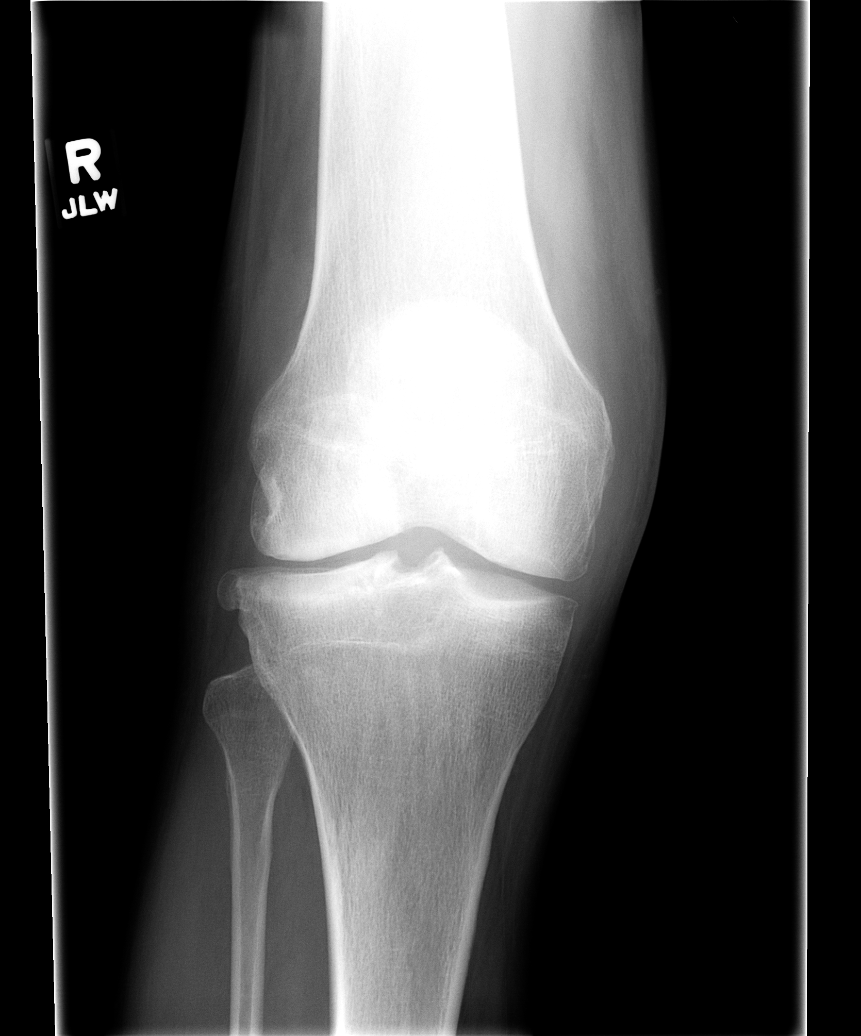

[view not recorded (2 of 4)]
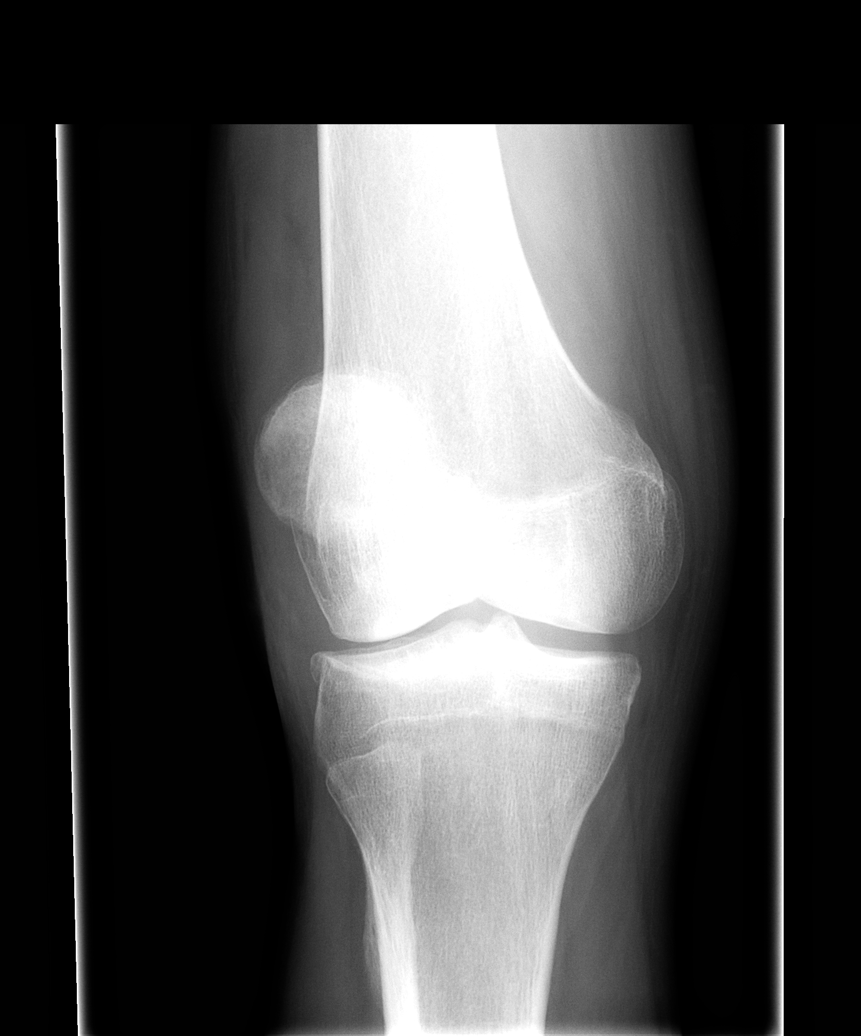

[view not recorded (3 of 4)]
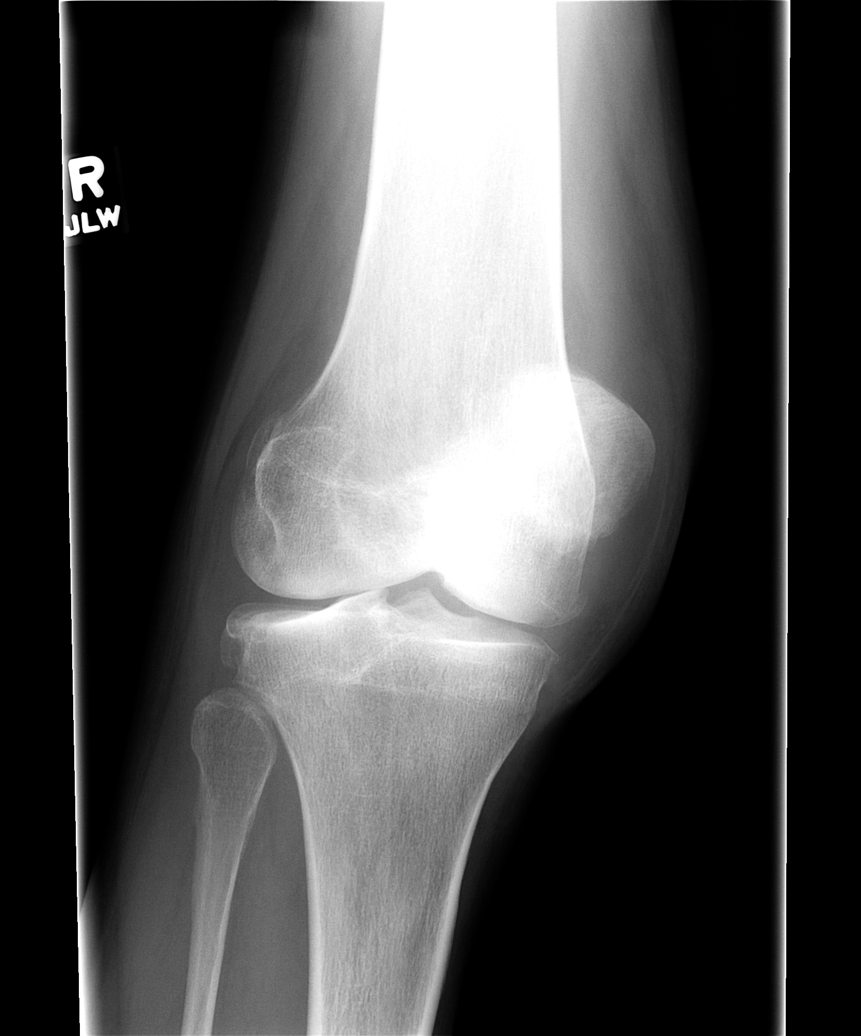

[view not recorded (4 of 4)]
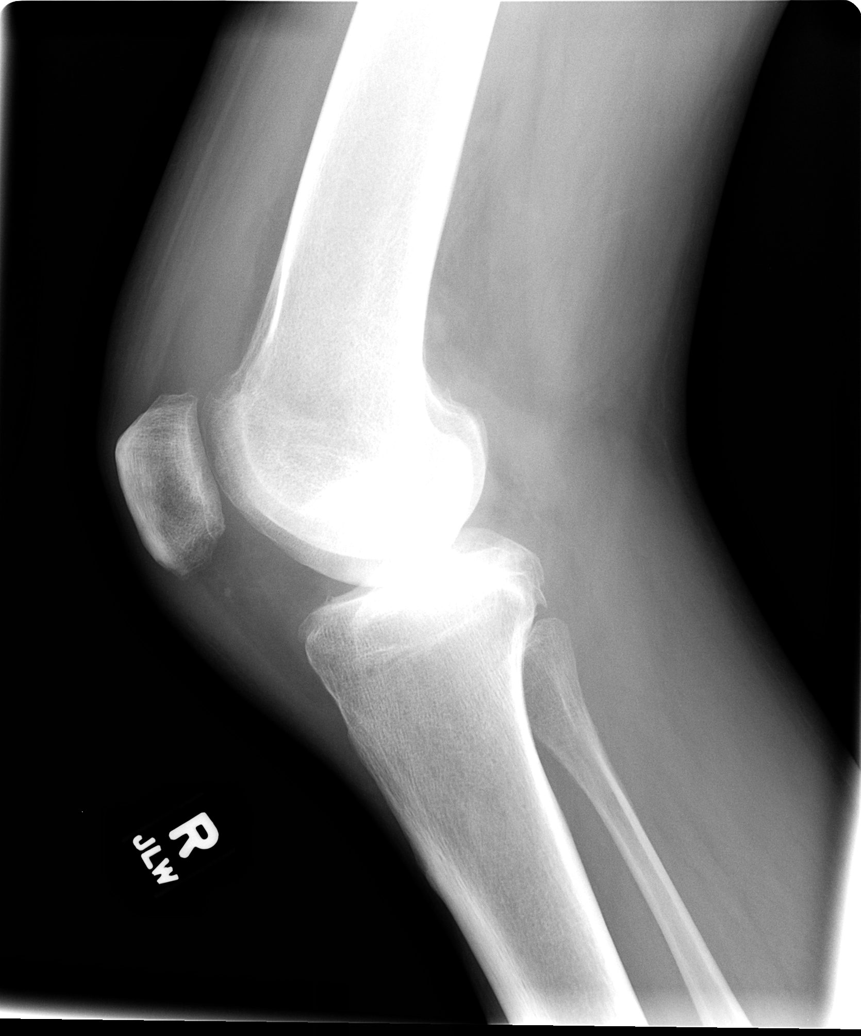

[4 of 4 positions shown; findings below may reference images not displayed]

FINDINGS: Lumbosacral spine views show no fracture or dislocation of the lumbar spine. Intervertebral disk spaces and posterior elements are well maintained.  There is anterior narrowing of T-12 of approximately 20% of uncertain age.  Posterior elements appear to be intact at all levels.  Soft tissues appear normal as far as can be seen.
IMPRESSION: 20% anterior narrowing T-12. Questionable age. 
 RIGHT KNEE ? 4 VIEW:
FINDINGS: AP lateral and both oblique views of the right knee are made without previous films for comparison and show an area of sclerosis associated with the lateral tibial plateau and a minimal irregularity along the anterior aspect of the tibial plateau on the lateral view that could be associated with a minimal subchondral fracture of the lateral tibial plateau.  There is a moderate sized joint effusion and there are old degenerative hypertrophic arthritic changes of the medial and lateral aspects of the knee.
IMPRESSION: Sclerosis associated with the lateral tibial plateau.  I cannot rule out a minimal tibial plateau subchondral fracture.  There is also minimal irregularity associated with the anterior aspect of the lateral view of the tibia and a joint effusion. There are old degenerative hypertrophic arthritic changes associated with both medial and lateral aspects of the knee, more prominent laterally.
IMPRESSION: Sclerosis associated with the medial tibial plateau. Possibility of a subchondral fracture of the lateral tibial plateau should be correlated clinically and the possibility of a CT scan should be considered.
 RIGHT HIP ? 3 VIEW:
FINDINGS: AP view of the pelvis and an AP and lateral view of the right hip are made and show no evidence of acute fracture, dislocation or foreign body. Sacroiliac joints appear normal.
IMPRESSION: Normal right hip.

## 2007-12-06 IMAGING — CR DG CHEST 1V PORT
1 series · 1 of 1 positions shown · non-contrast
Comparison: 07/11/05.

CLINICAL DATA: Pneumonia.  VATS.
 PORTABLE CHEST ? 1 VIEW:

[view not recorded]
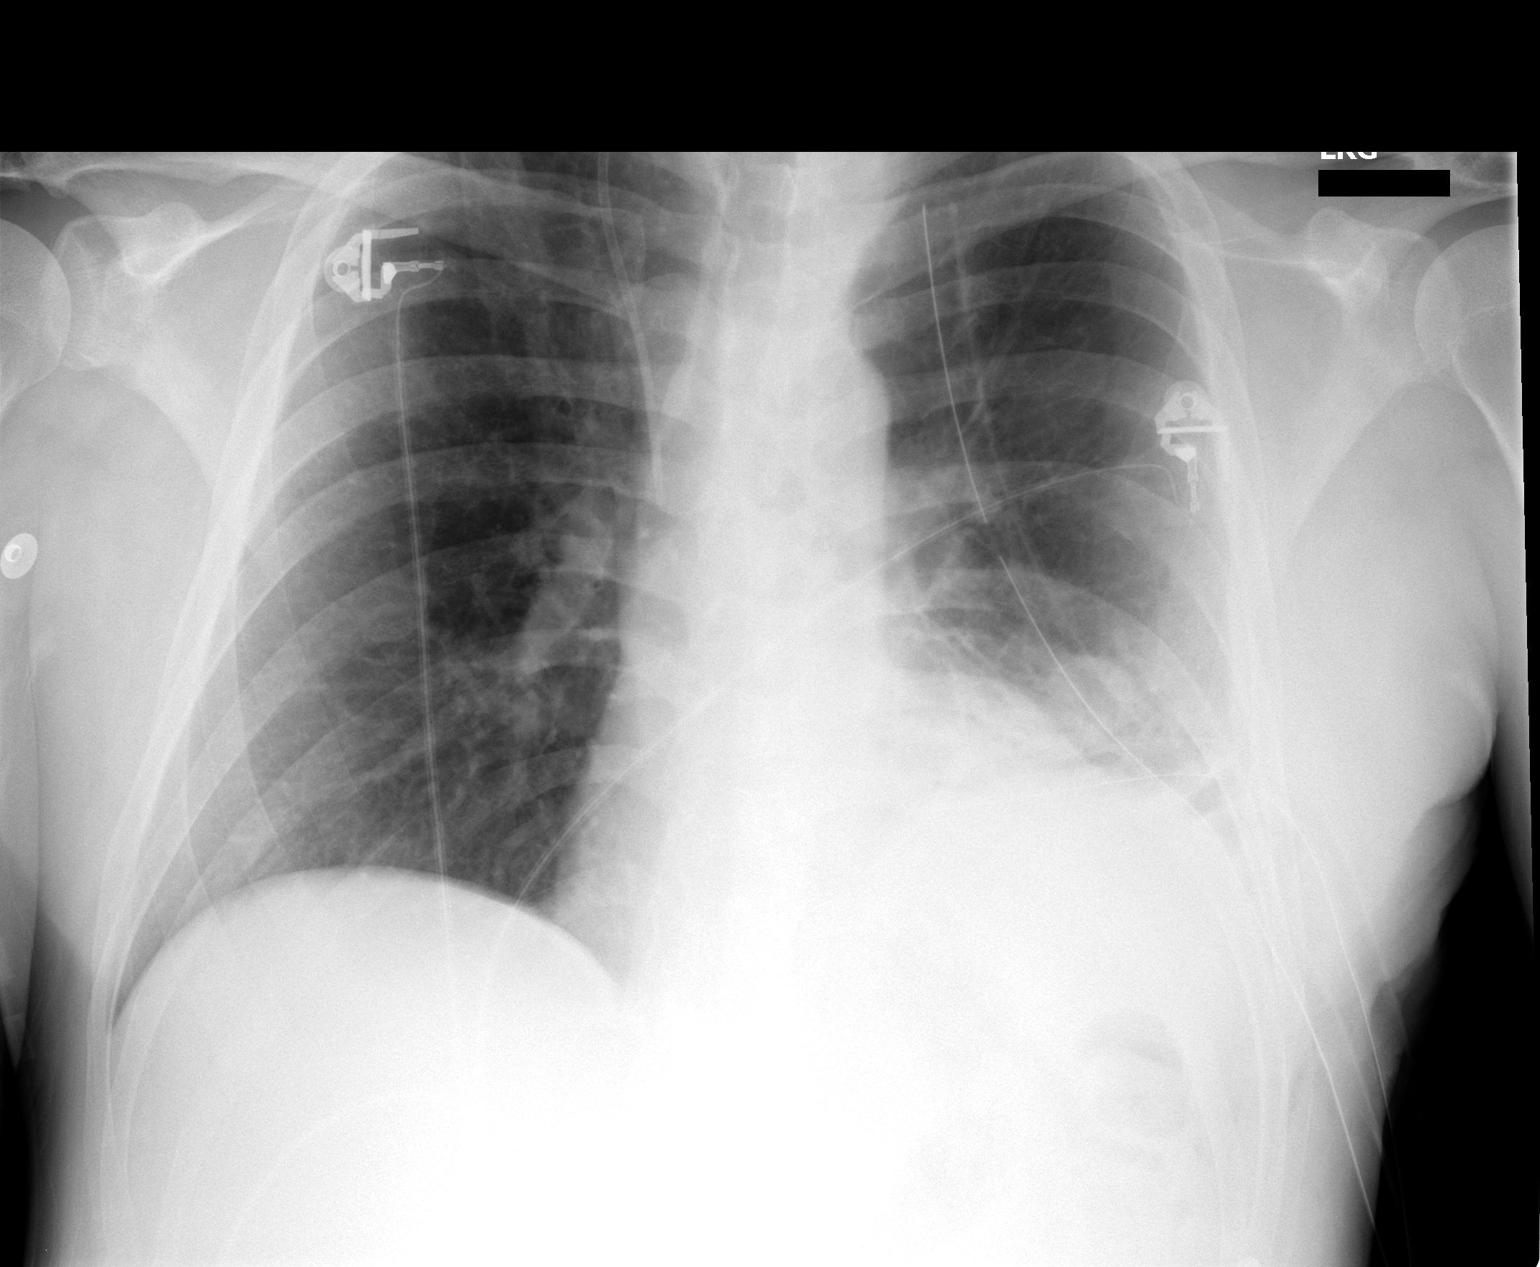

[1 of 1 positions shown; findings below may reference images not displayed]

FINDINGS: One of the patient?s 3 left sided chest tubes has been removed.  No pneumothorax.  Aeration in the left lung appears slightly improved.  Right lung remains clear.
IMPRESSION: 1.  Status post removal of 1 of 3 left chest tubes.  No pneumothorax. 
 2.  Improved left base aeration.

## 2008-03-23 IMAGING — CR DG CHEST 2V
2 series · 2 of 2 positions shown · non-contrast
Comparison: No priors for comparison.

CLINICAL DATA: Preoperative study for bronchoscopy in OR.  Pneumonia.  Shortness of breath.
 CHEST ? 2 VIEW:

[w chest pa]
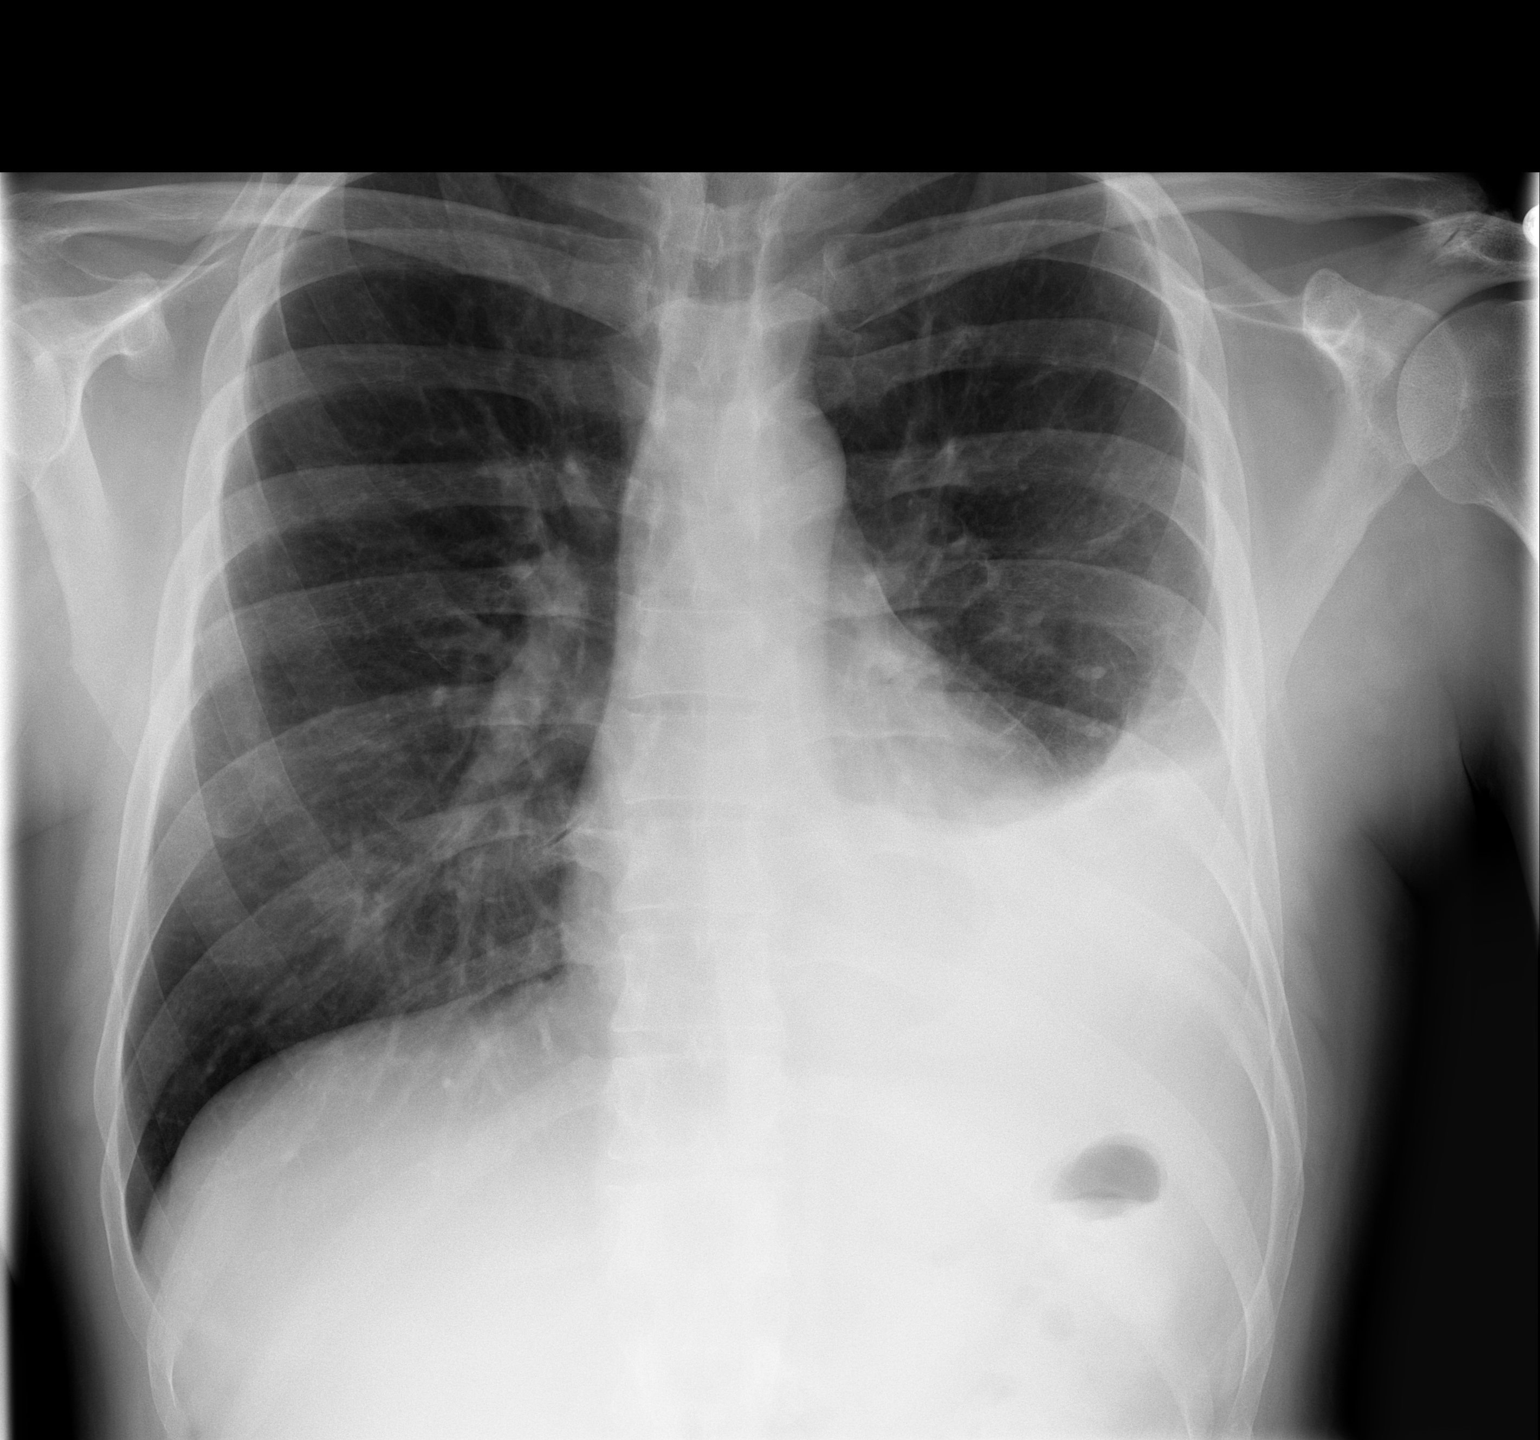

[w chest lat]
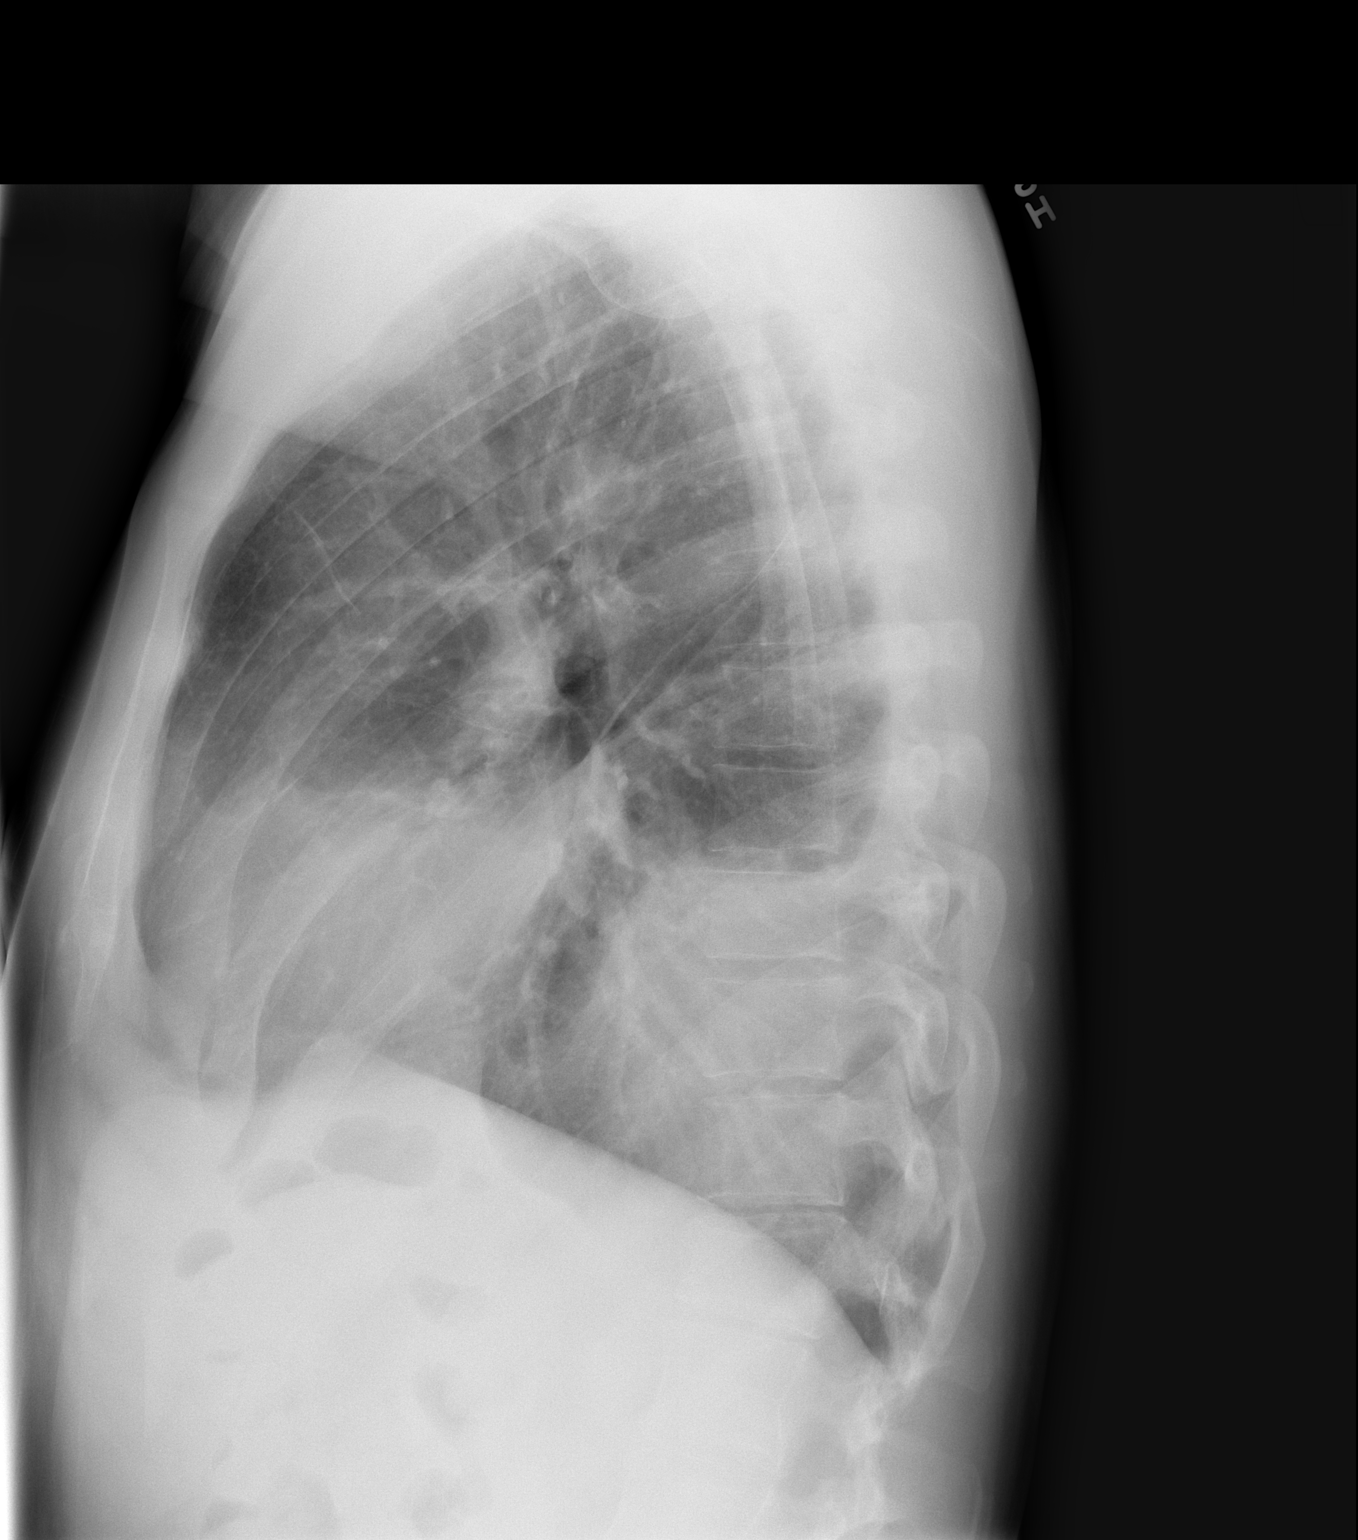

[2 of 2 positions shown; findings below may reference images not displayed]

FINDINGS: Left pleural effusion and parenchymal density at the left base compatible with left lower lobar pneumonic consolidation/atelectasis.  There appears to be some density in the lingular segment, as well.
IMPRESSION: Left lower lobar and lingular pneumonic consolidation/atelectasis and left pleural effusion.

## 2008-03-24 IMAGING — CR DG CHEST 1V PORT
1 series · 1 of 1 positions shown · non-contrast
Comparison: 07/09/05.

CLINICAL DATA: VATS/pneumonia.
 PORTABLE CHEST - 1 VIEW:

[view not recorded]
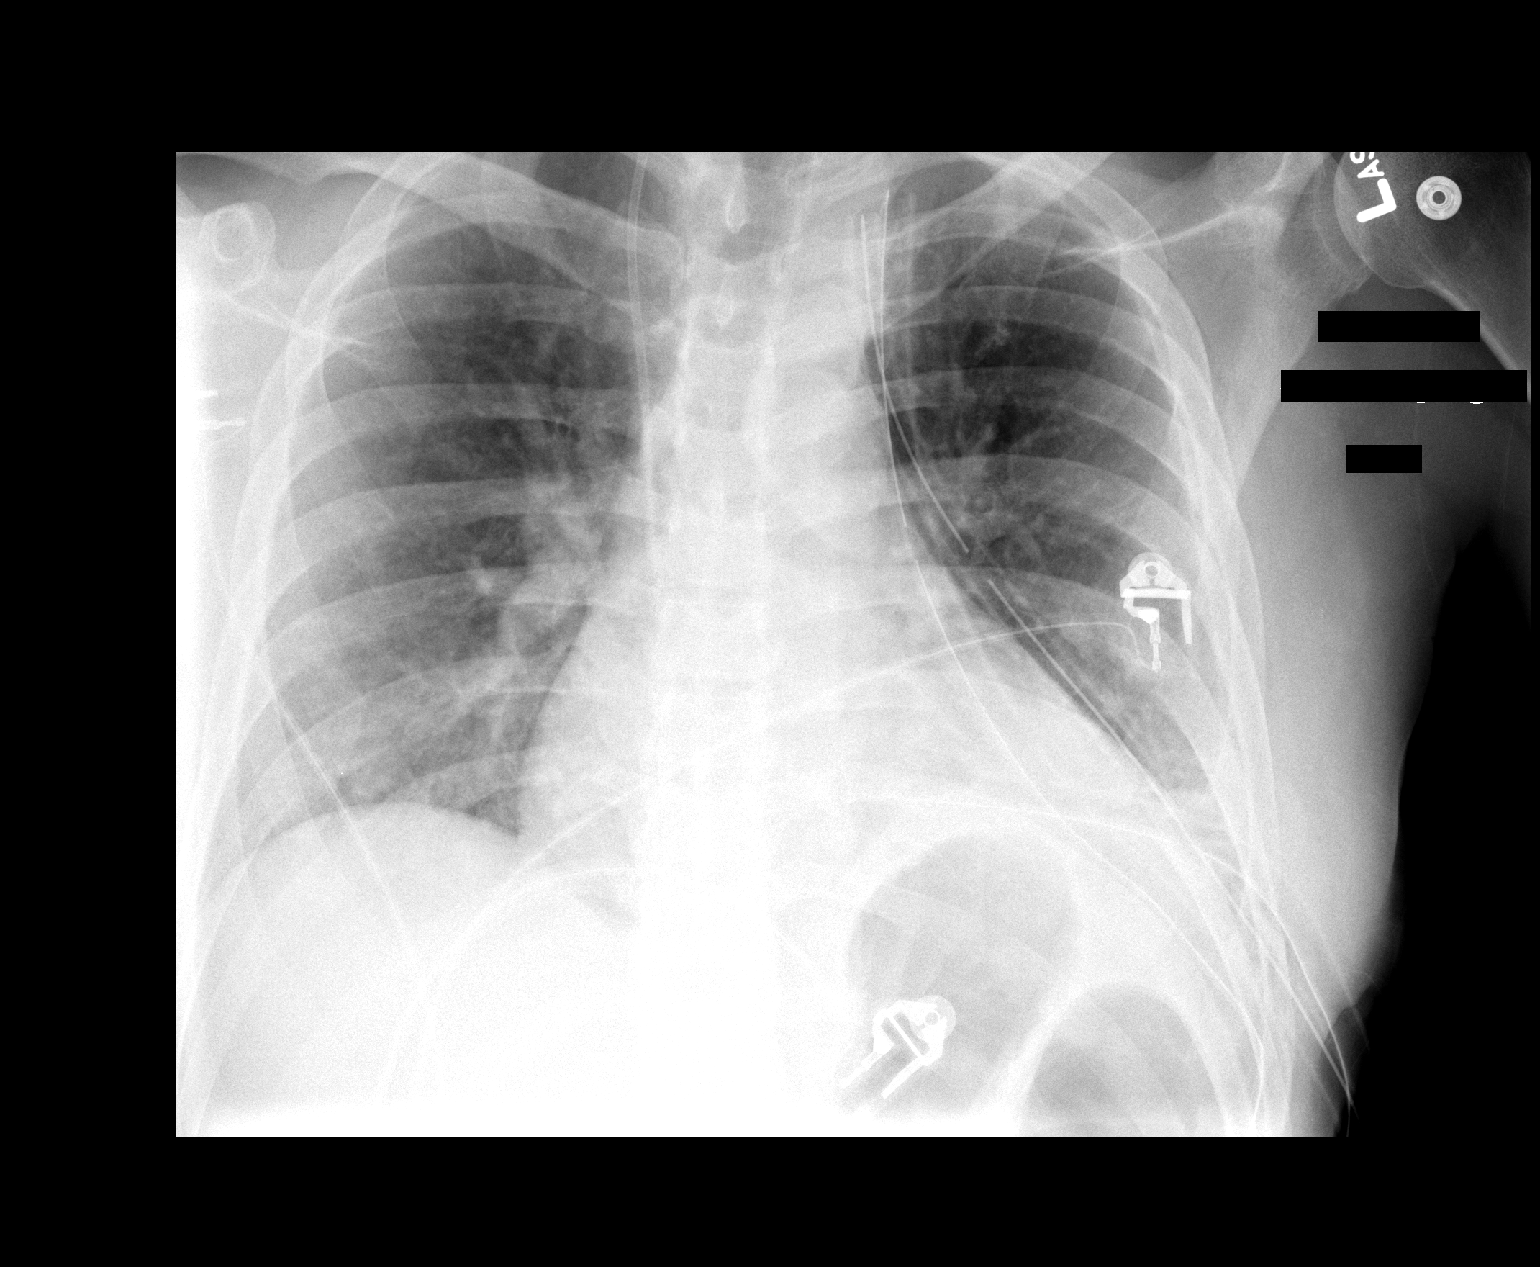

[1 of 1 positions shown; findings below may reference images not displayed]

FINDINGS: Three left chest tubes remain in place. No pneumothorax. There is some left pleural thickening. The right lung is clear.
 Left lower lobe aeration has improved.
IMPRESSION: Left lower lobe aeration improved ? otherwise no significant change.

## 2008-03-25 IMAGING — CR DG CHEST 1V PORT
1 series · 1 of 1 positions shown · non-contrast
Comparison: 07/10/05

CLINICAL DATA: Pneumonia.  Status post VATS.
 PORTABLE CHEST ? 1 VIEW:

[view not recorded]
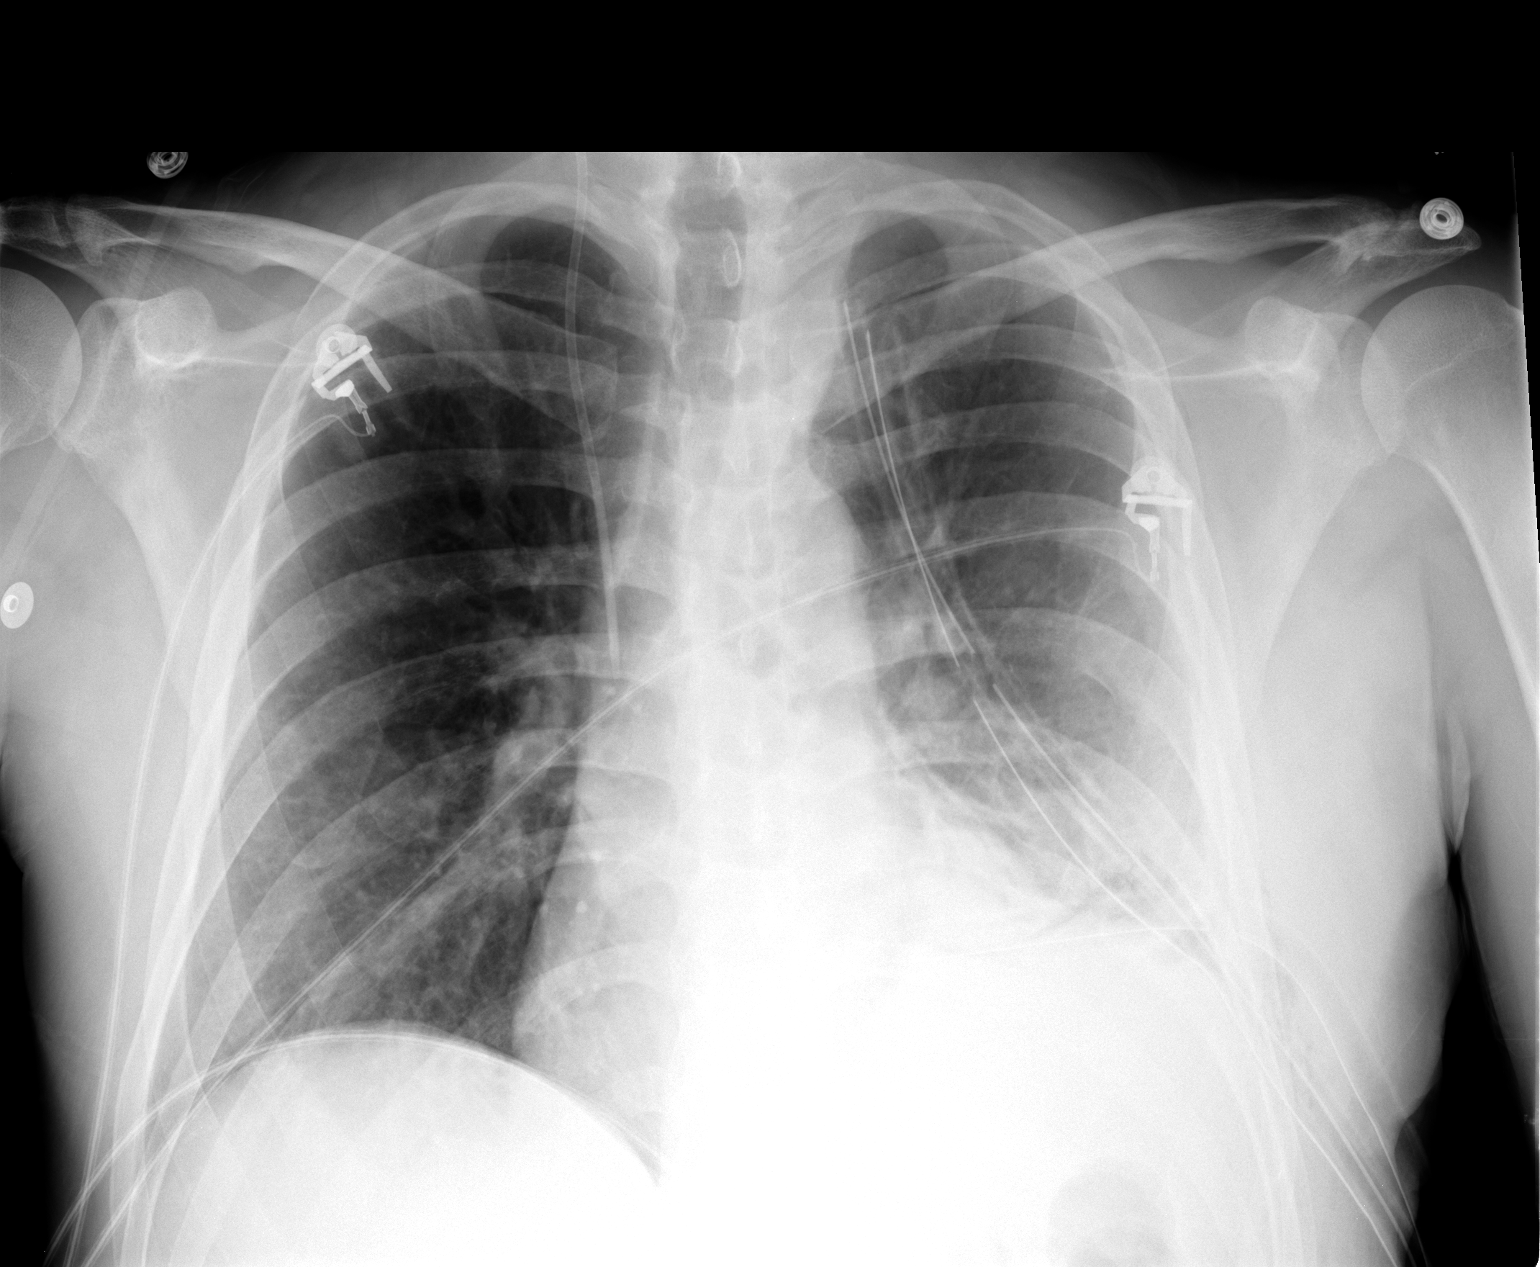

[1 of 1 positions shown; findings below may reference images not displayed]

FINDINGS: 3 left chest tubes remain in place.  No pneumothorax is identified.  There has been interval increase in left effusion and left base air space disease.  Right lung remains clear.  Cardiac silhouette is unchanged.
IMPRESSION: 1.  Increased left effusion and left base air space opacity.
 2.  No pneumothorax.

## 2008-03-27 IMAGING — CR DG CHEST 1V PORT
1 series · 1 of 1 positions shown · non-contrast
Comparison: 07/12/05.

CLINICAL DATA: Status post VATS.  Follow up aeration. 
 PORTABLE CHEST ? 1 VIEW ? 07/13/05 AT 6496 HOURS:

[view not recorded]
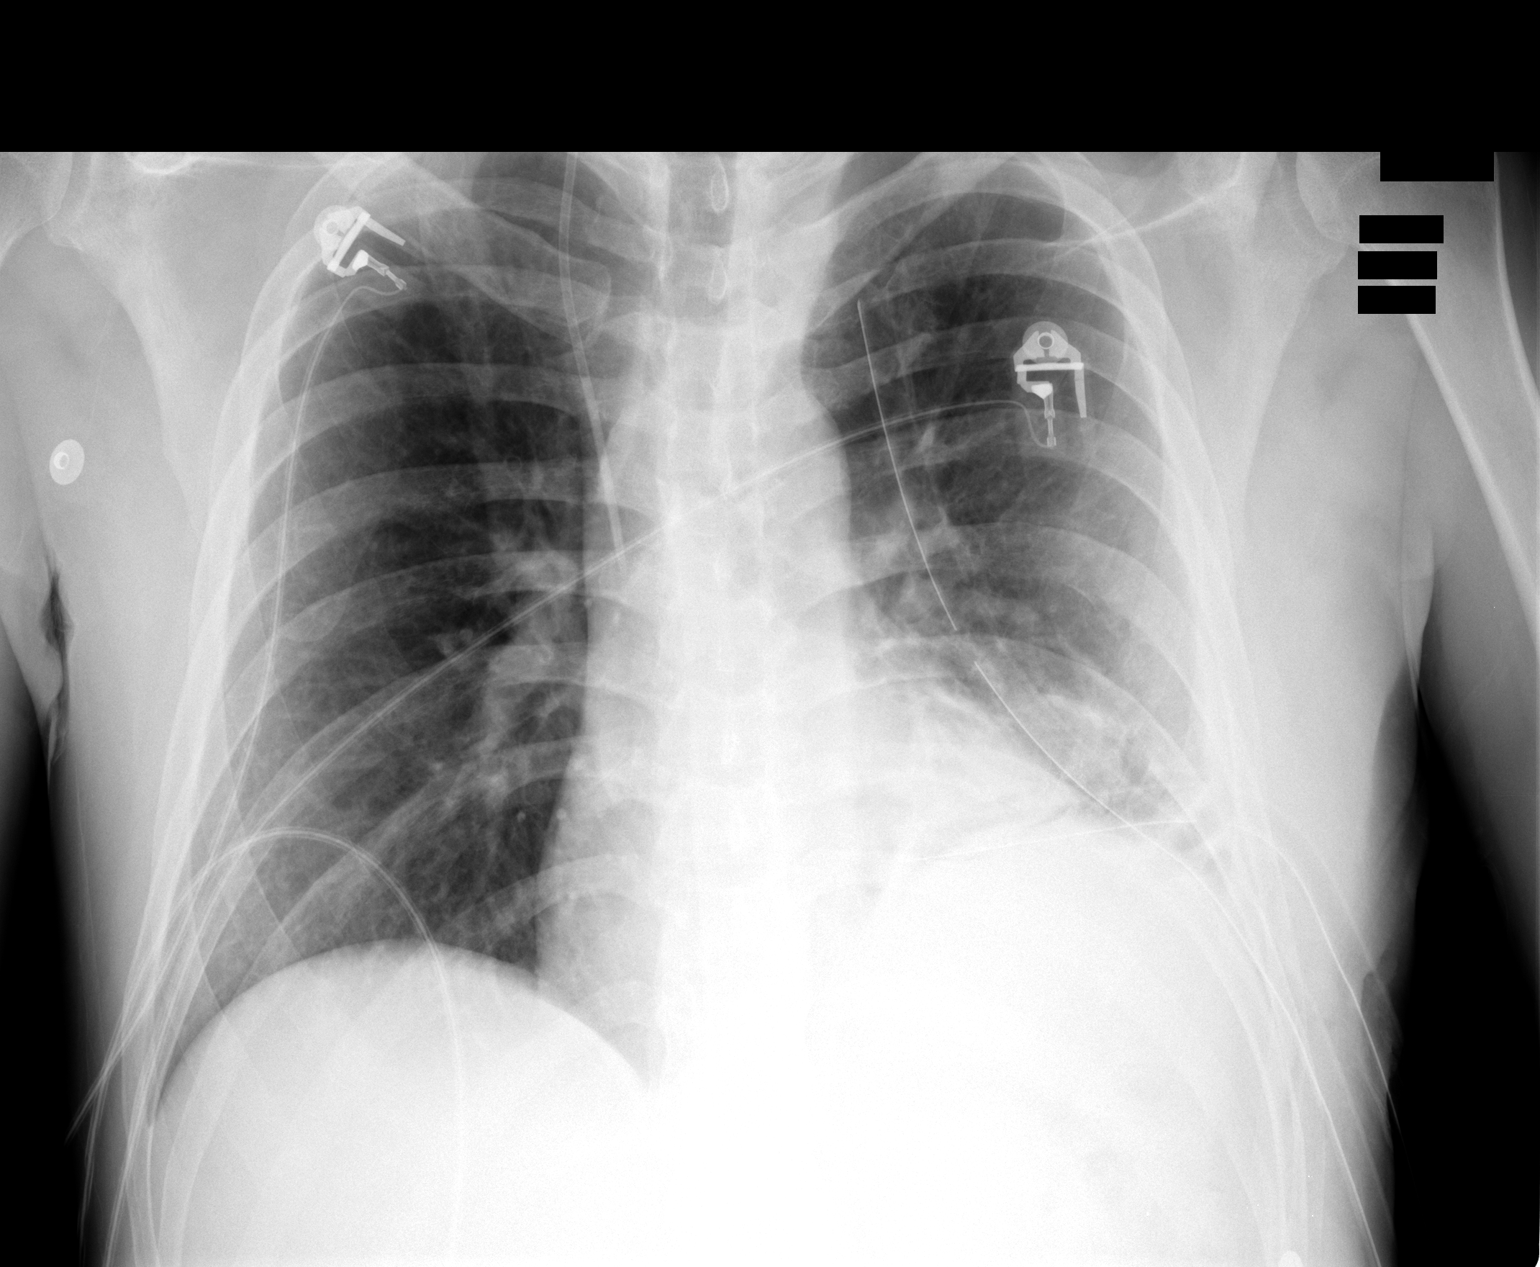

[1 of 1 positions shown; findings below may reference images not displayed]

FINDINGS: Left basilar atelectasis remains ? unchanged.  The left-sided chest tubes are unchanged in position.  There is no pneumothorax.
IMPRESSION: Stable left basilar atelectasis.  No pneumothorax.

## 2008-03-28 IMAGING — CR DG CHEST 1V PORT
1 series · 1 of 1 positions shown · non-contrast
Comparison: none

CLINICAL DATA: Follow-up pneumonia.  Left chest tube removal.
 PORTABLE CHEST -  SINGLE VIEW - 07/14/05:

[view not recorded]
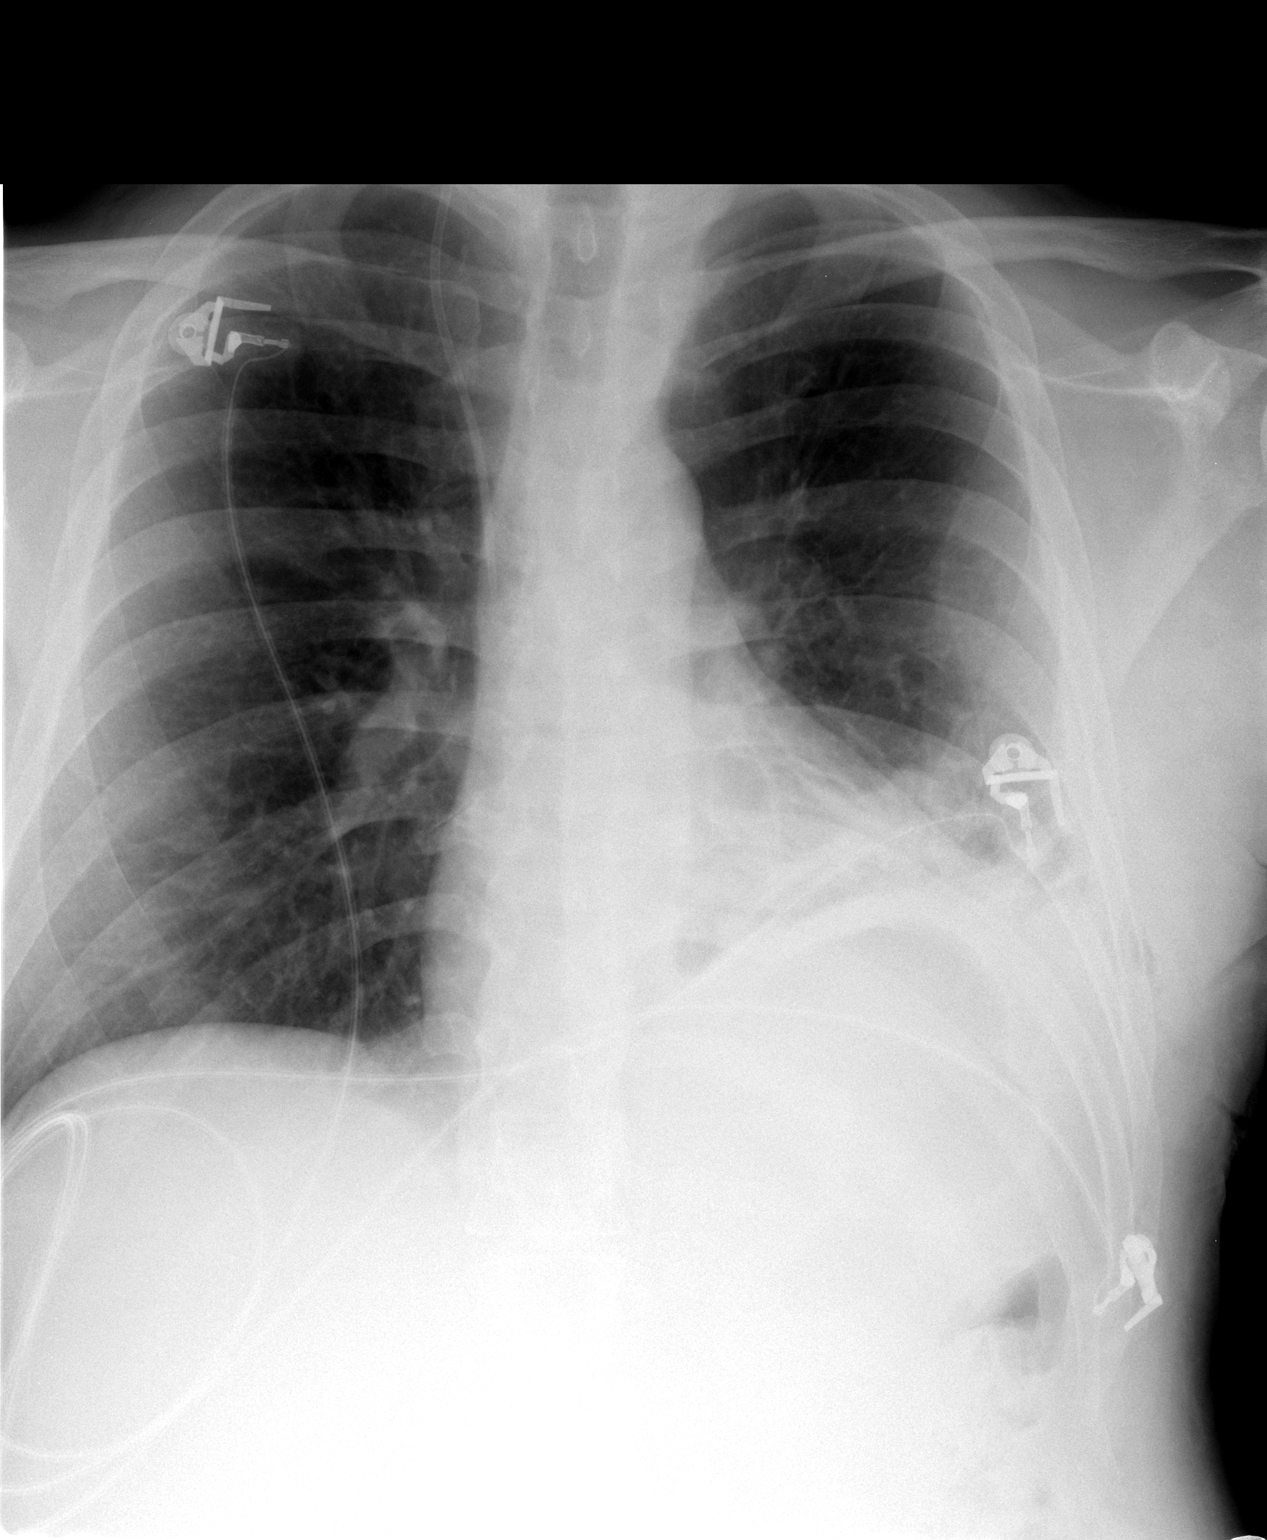

[1 of 1 positions shown; findings below may reference images not displayed]

FINDINGS: An AP semierect portable film of the chest made on 07/14/05 at 1172 hours is compared to the previous study of 07/14/05 at 8978 hours and shows the left chest tube to have been removed.  There is no significant pneumothorax.  There remains left basilar atelectasis.  The right jugular central venous catheter remains in place.
IMPRESSION: The left chest tube has been removed.  There remains some left basilar atelectasis which is little changed.
 Stable right jugular central venous catheter.

## 2008-03-28 IMAGING — CR DG CHEST 1V PORT
1 series · 1 of 1 positions shown · non-contrast
Comparison: 07/13/05.

CLINICAL DATA: Pneumonia.  VATS.
 PORTABLE CHEST - 1 VIEW - 07/14/05:

[view not recorded]
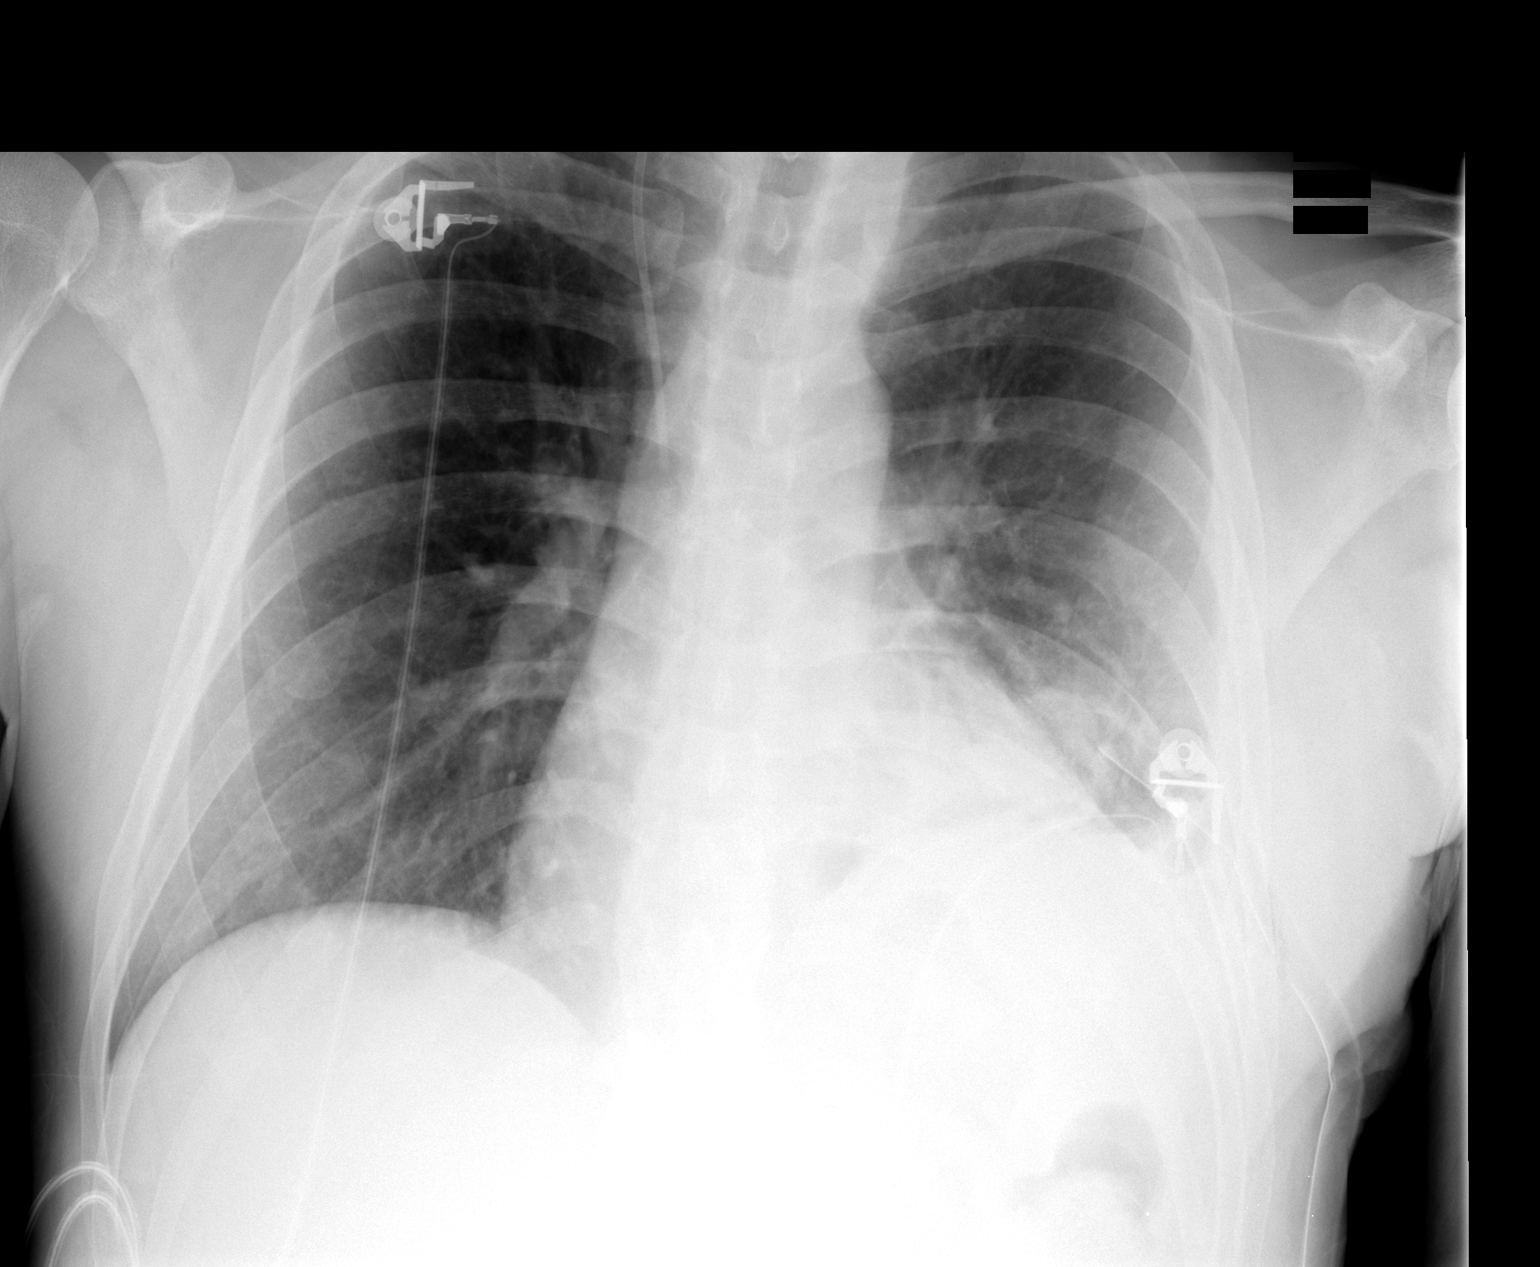

[1 of 1 positions shown; findings below may reference images not displayed]

FINDINGS: The more superiorly located left-sided chest tube has been removed.  The left basilar chest tube is still in place but has been pulled back and the side-port may be outside the chest.  No left-sided pneumothorax is seen.  The right IJ catheter is stable. There is persistent loss of volume in the left lung with left basilar atelectasis.  The right lung remains clear.
IMPRESSION: 1.  Removal of the more superior left-sided chest tube and the left basilar chest tube has pulled back as discussed above.  No pneumothorax is seen.
 2.  Persistent loss of volume in the left lung with left lower lobe atelectasis.

## 2008-03-29 IMAGING — CR DG CHEST 2V
2 series · 2 of 2 positions shown · non-contrast
Comparison: 07/14/05.

CLINICAL DATA: Post chest thoracotomy removal.  
 CHEST - 2 VIEW ? 07/15/05:

[view not recorded (1 of 2)]
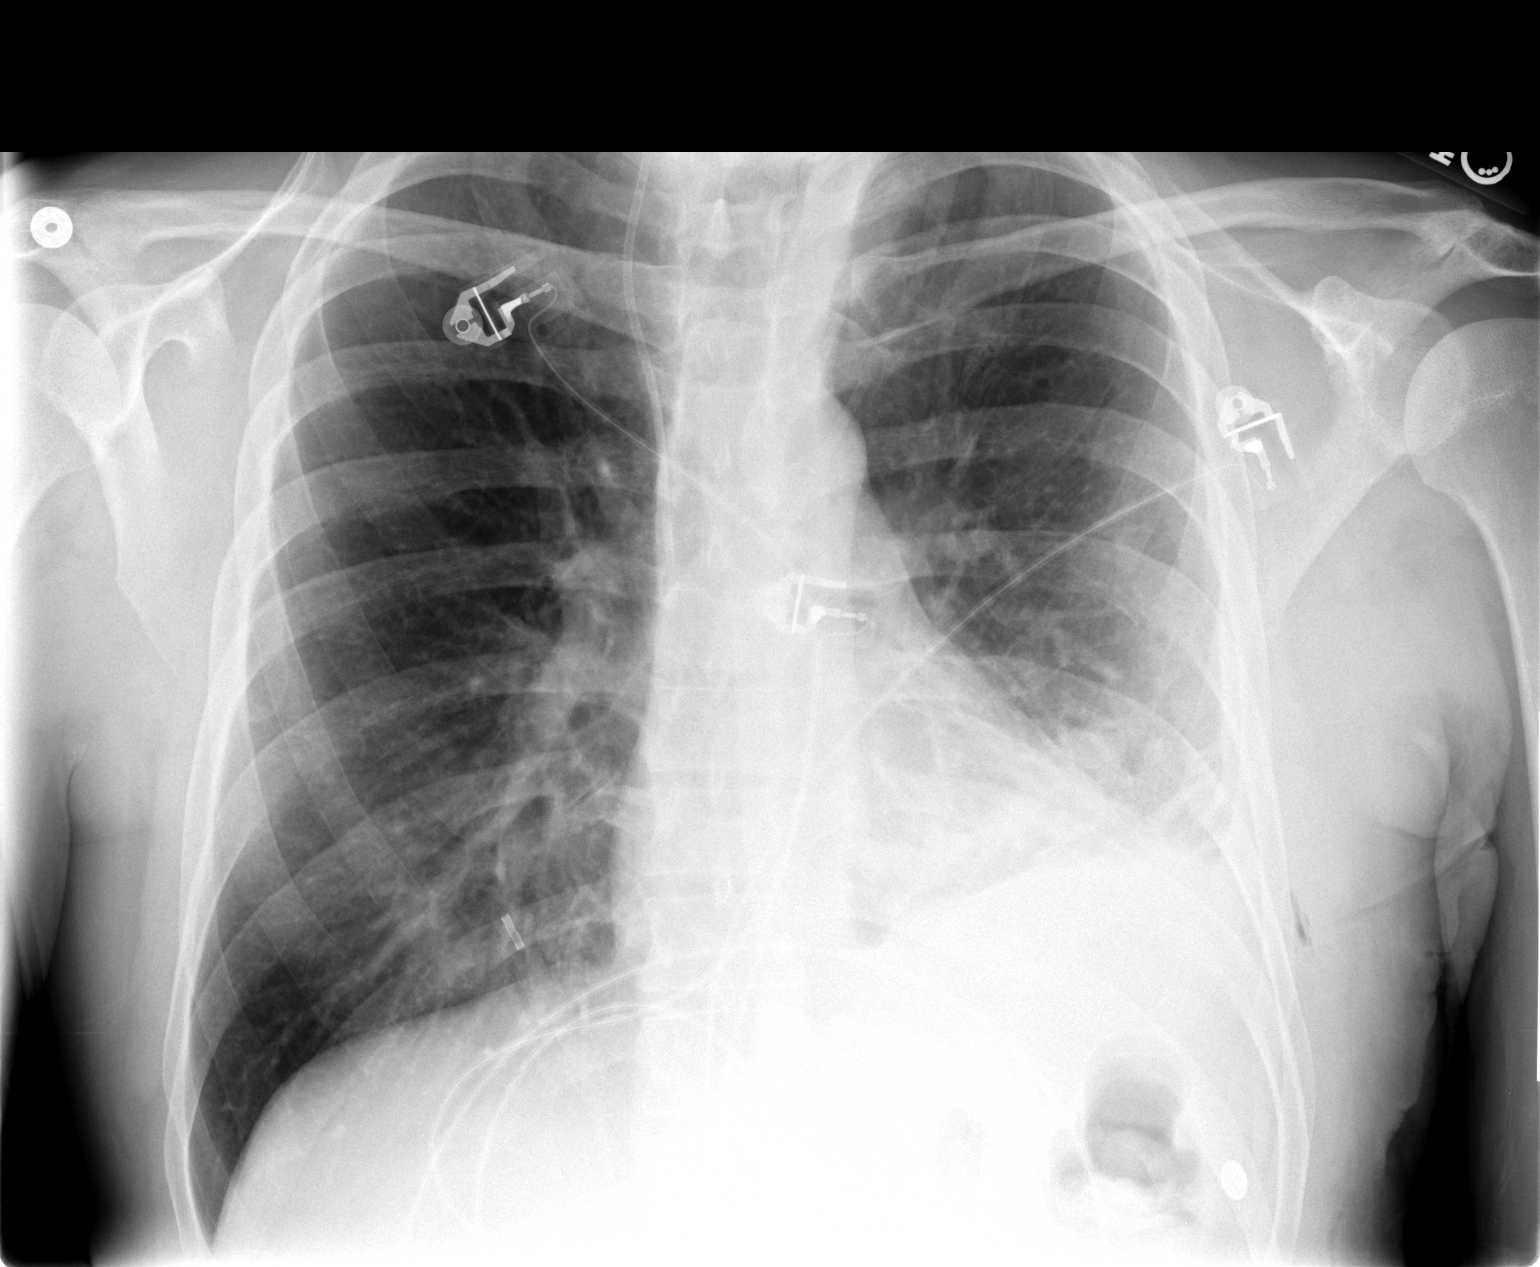

[view not recorded (2 of 2)]
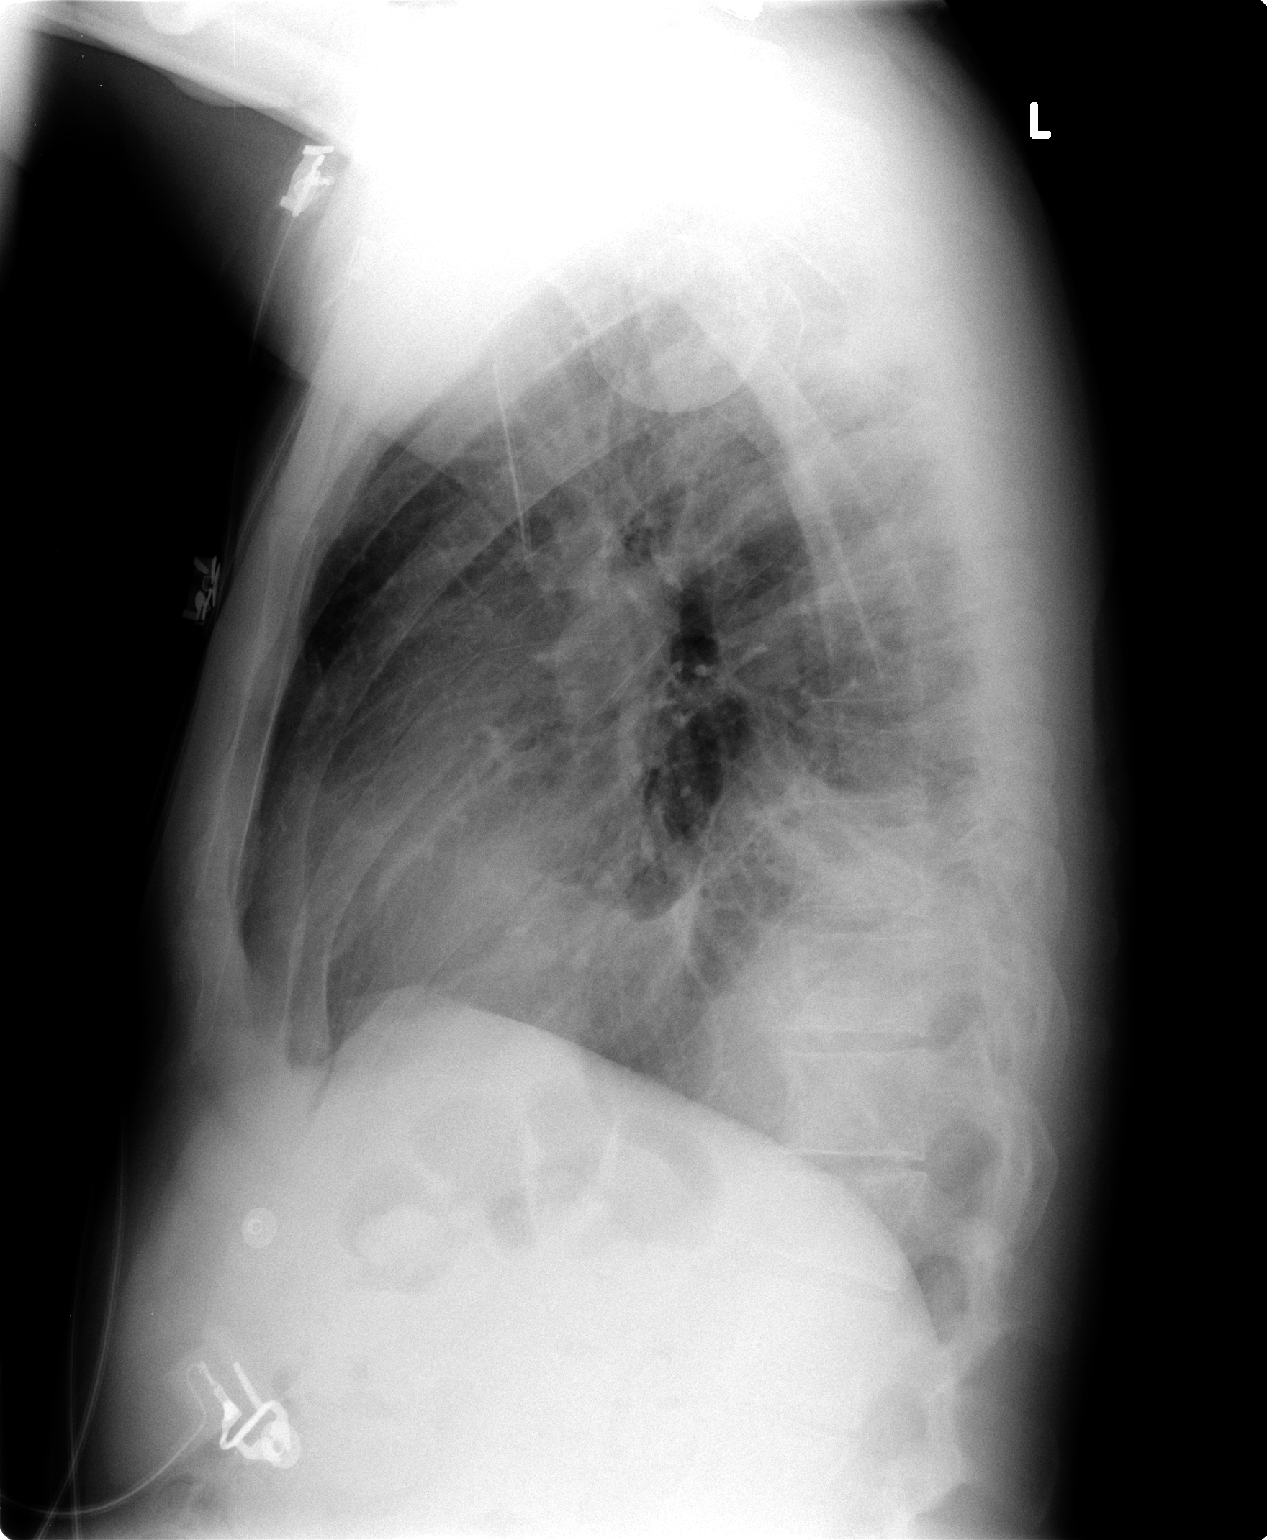

[2 of 2 positions shown; findings below may reference images not displayed]

FINDINGS: The pleuroparenchymal opacity in the left lower lung is unchanged.  No pneumothorax.   Right lung is clear.
IMPRESSION: No interval change in pleuroparenchymal opacity in the left lower hemithorax.  No pneumothorax.

## 2008-04-26 IMAGING — CR DG CHEST 2V
2 series · 2 of 2 positions shown · non-contrast
Comparison: 07/21/05.

CLINICAL DATA: Post lung cancer surgery [DATE]. 
 CHEST, TWO VIEWS:

[view not recorded (1 of 2)]
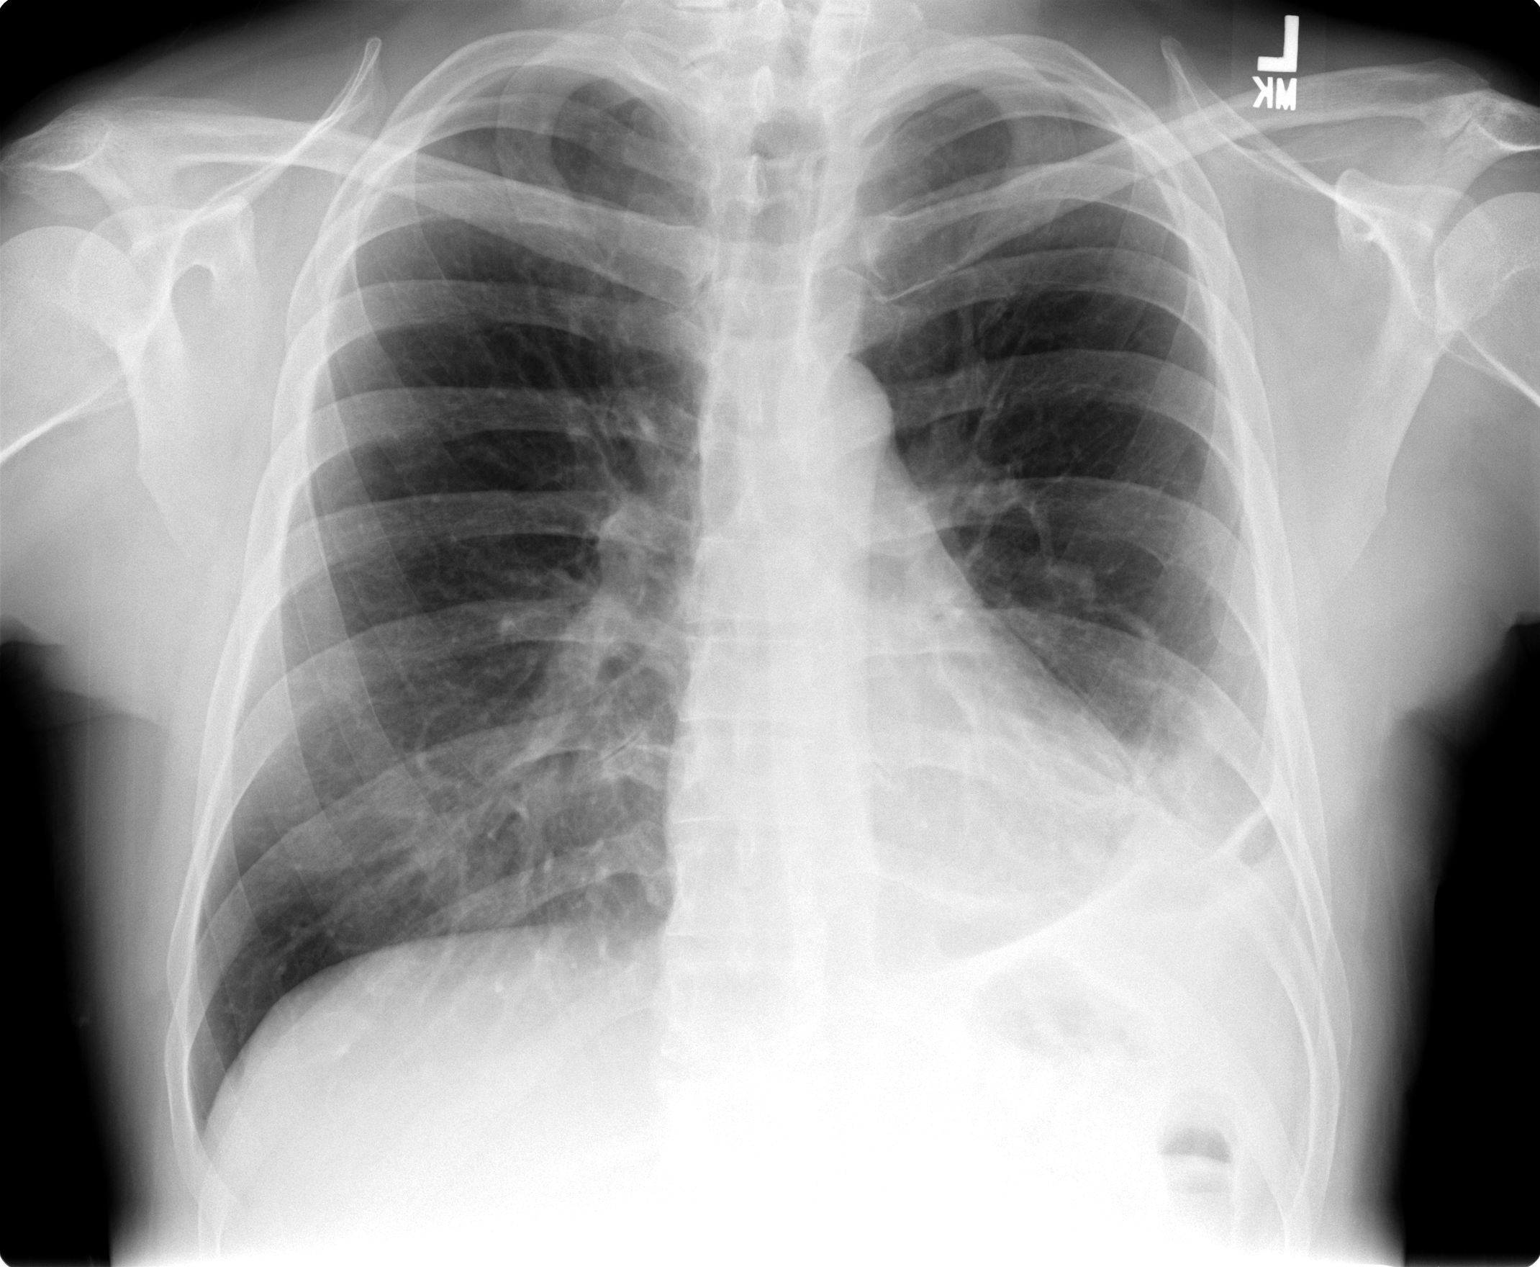

[view not recorded (2 of 2)]
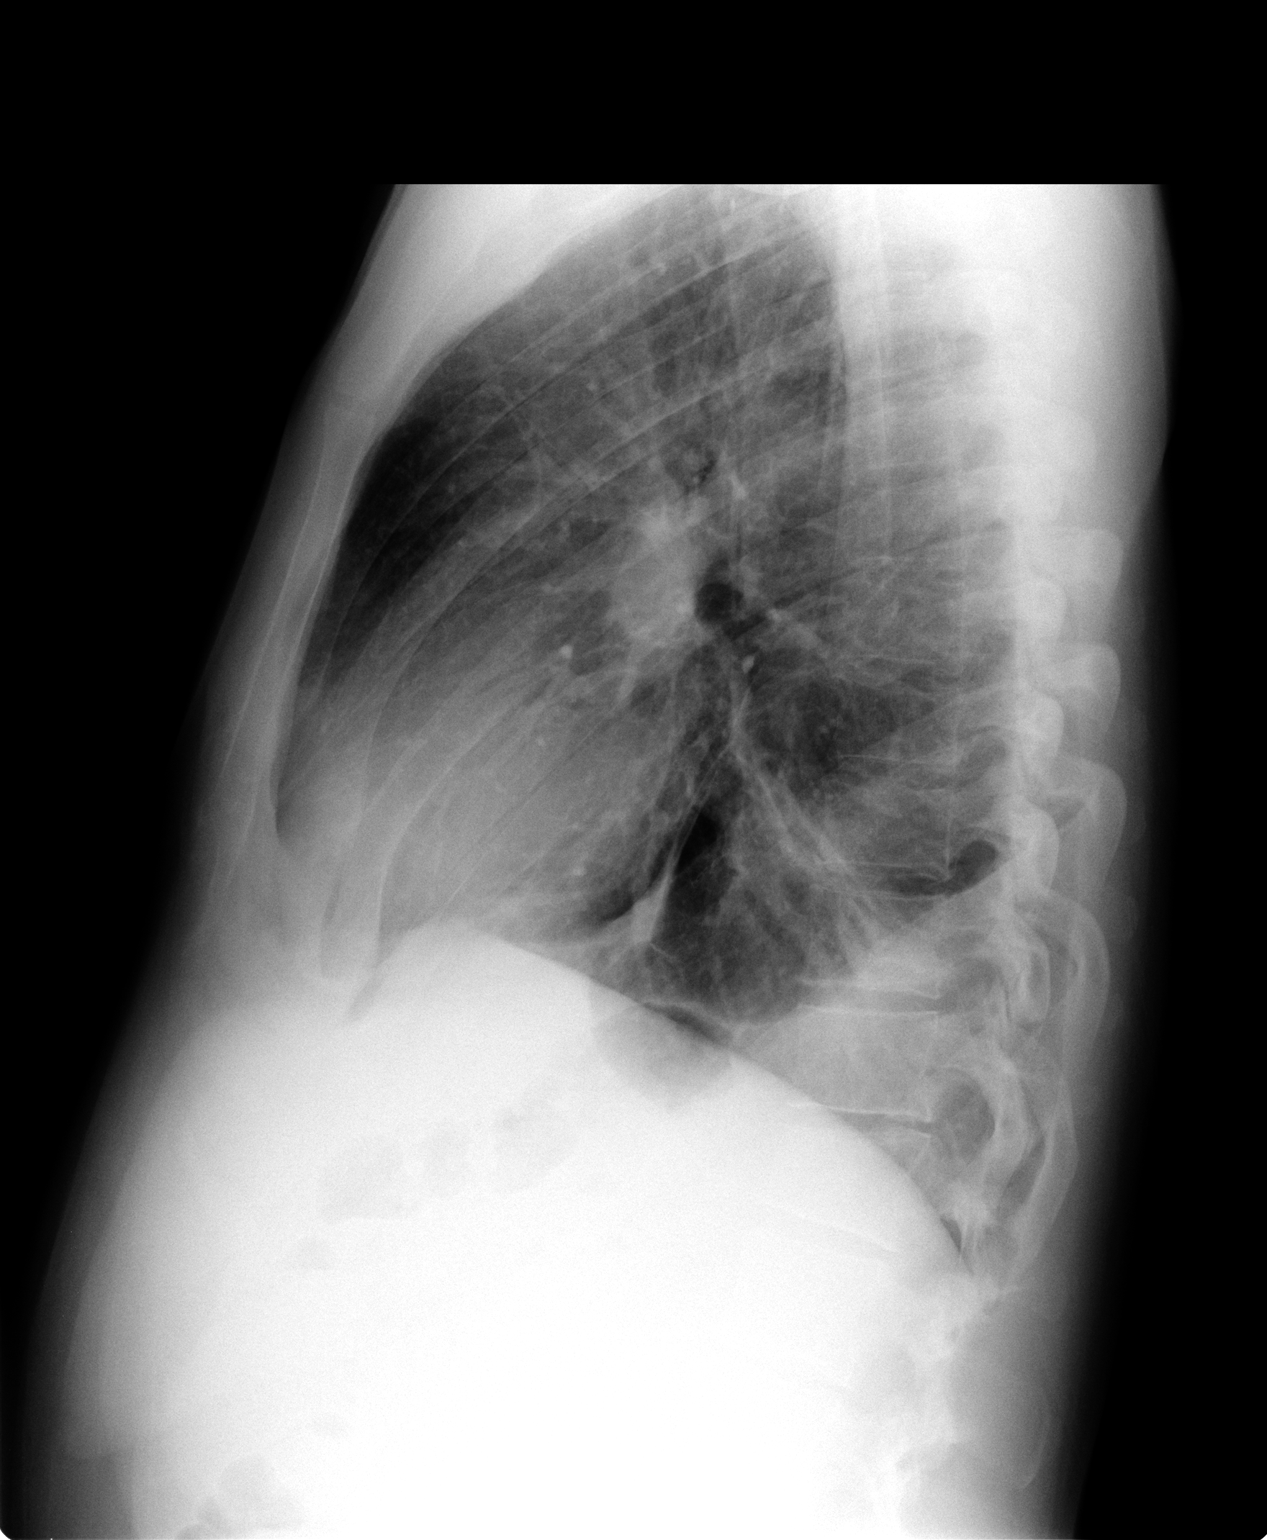

[2 of 2 positions shown; findings below may reference images not displayed]

FINDINGS: There is still some residual left lower lobe pleuroparenchymal reaction but it has improved slightly.  The right lung remains clear.
IMPRESSION: Left basilar pleuroparenchymal reaction slightly improved.  No new findings.

## 2008-06-28 IMAGING — CR DG CHEST 2V
2 series · 2 of 2 positions shown · non-contrast
Comparison: 08/12/05

CLINICAL DATA: Left base atelectasis.  Recent left lung surgery.  
 CHEST ? 2 VIEW:

[w chest pa]
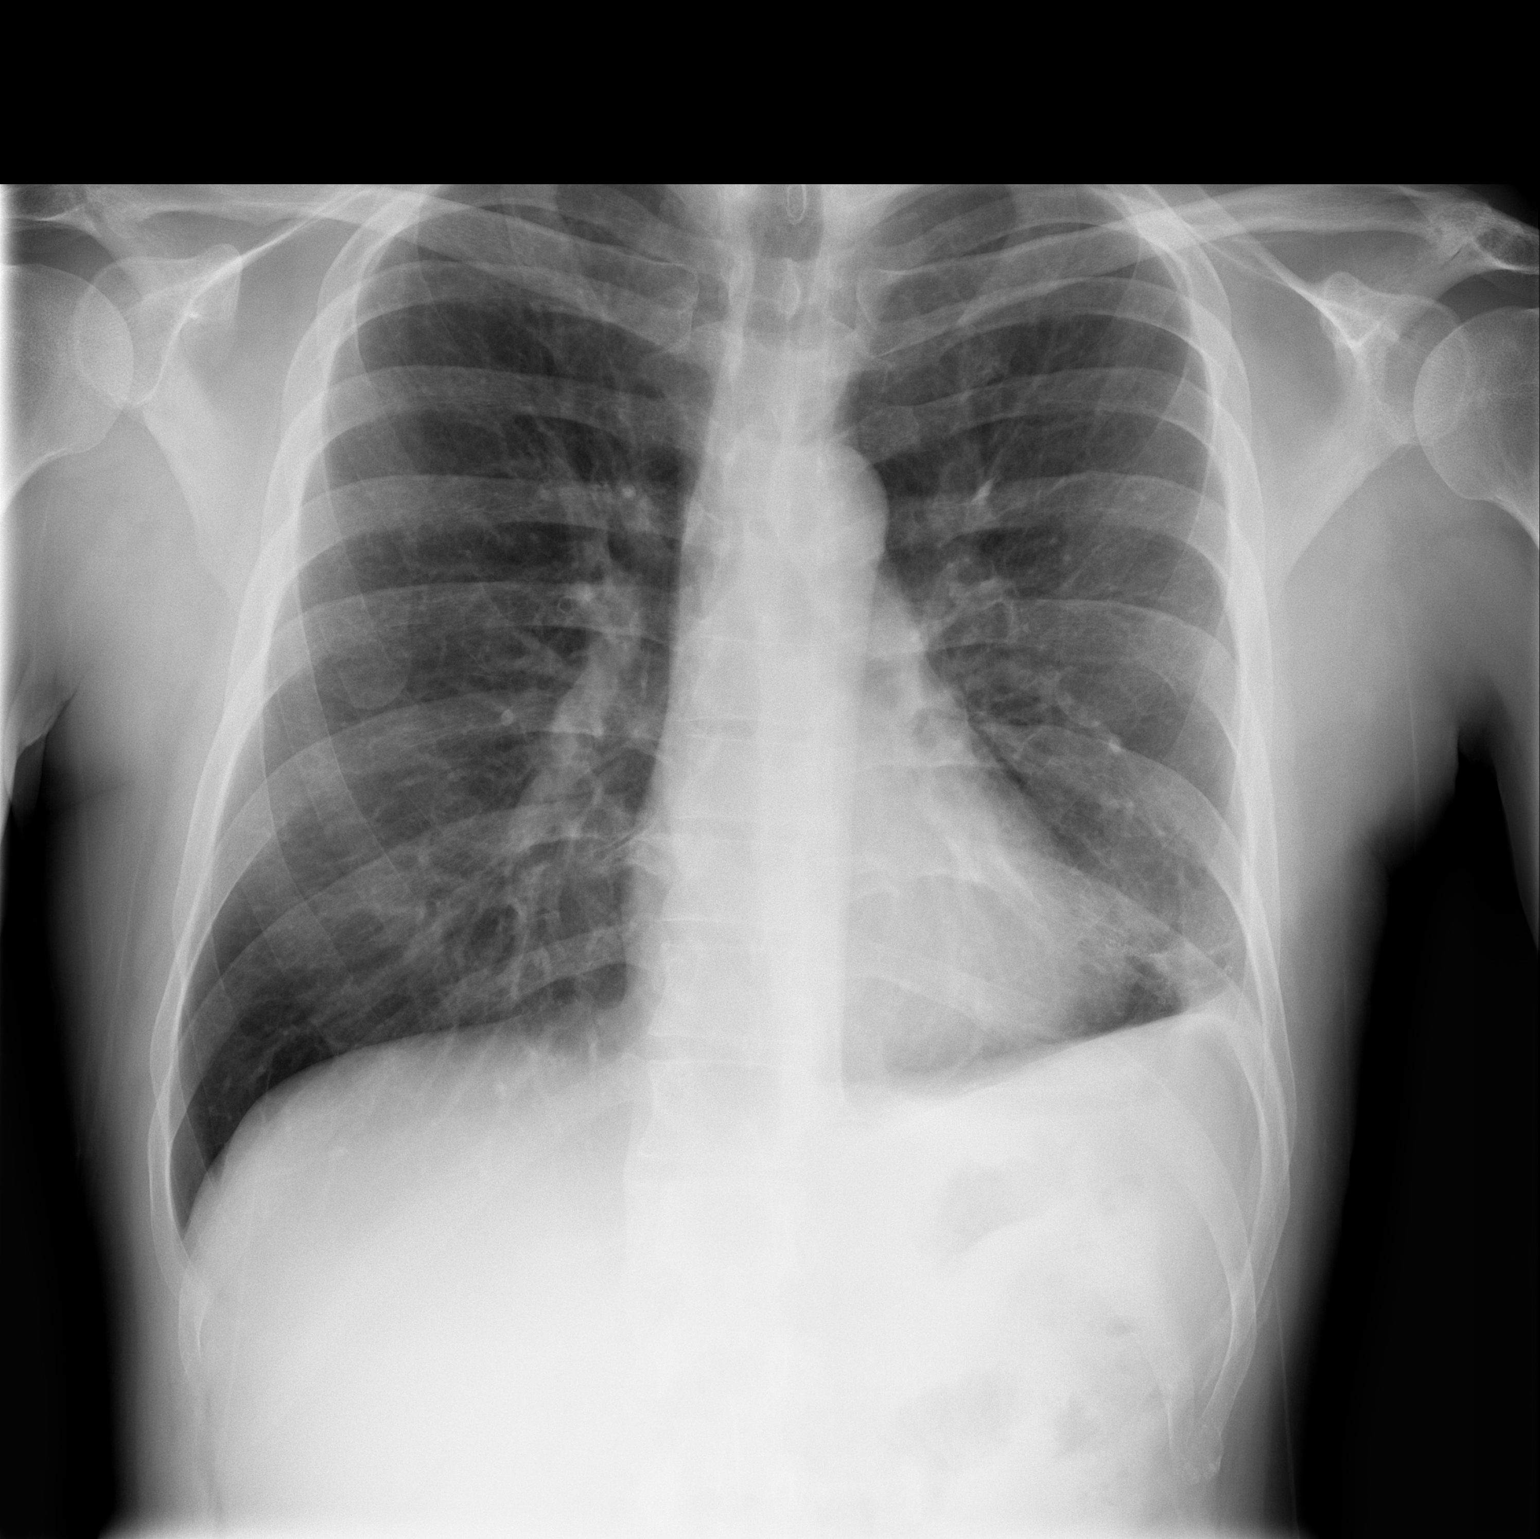

[w chest lat]
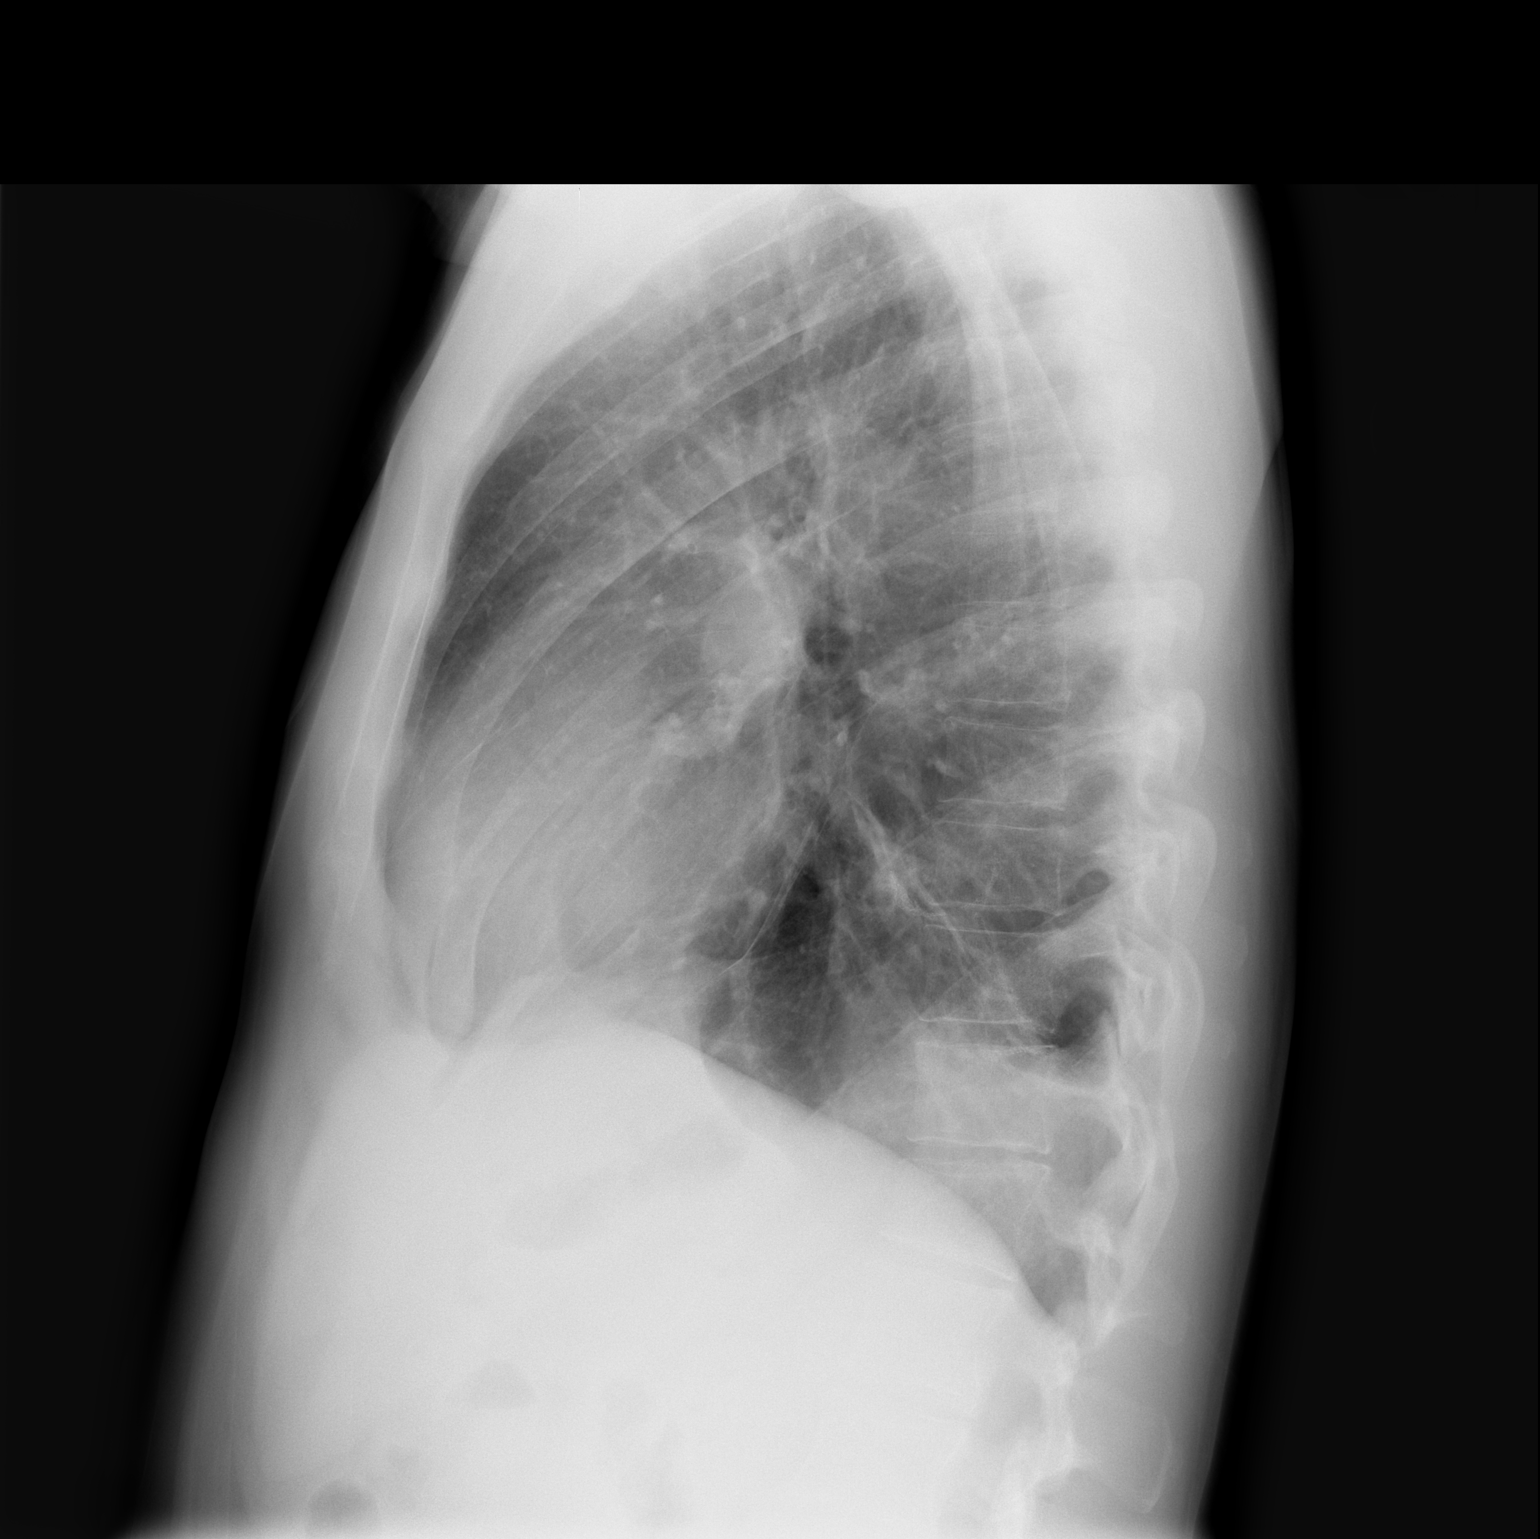

[2 of 2 positions shown; findings below may reference images not displayed]

FINDINGS: There is no pneumothorax.  The atelectasis at the left base has improved.  Right lung is clear.  Heart size and vascularity are normal.
IMPRESSION: Almost complete resolution of the left base atelectasis.

## 2008-07-31 IMAGING — CR DG CHEST 2V
2 series · 2 of 2 positions shown · non-contrast
Comparison: 10/14/05.

CLINICAL DATA: 40-year-old with torn lateral meniscus.  Preoperative respiratory exam. 
 CHEST ? 2 VIEW:

[view not recorded (1 of 2)]
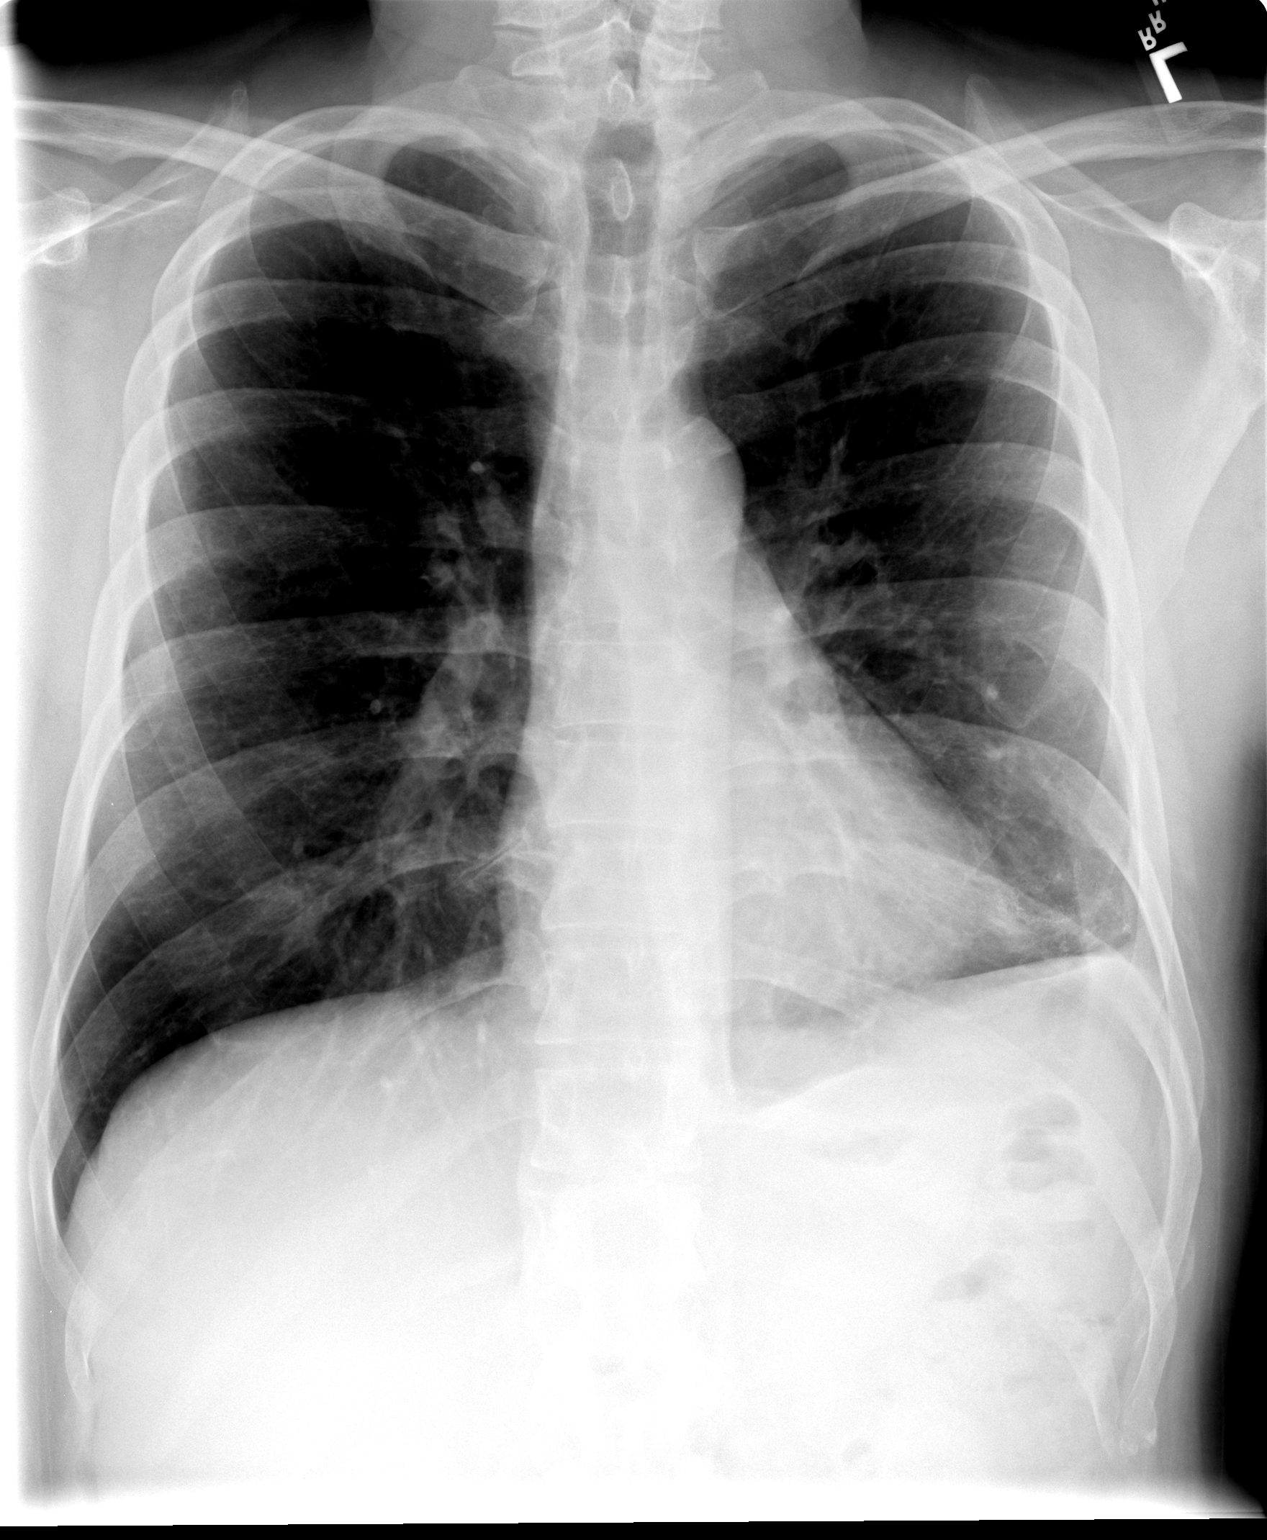

[view not recorded (2 of 2)]
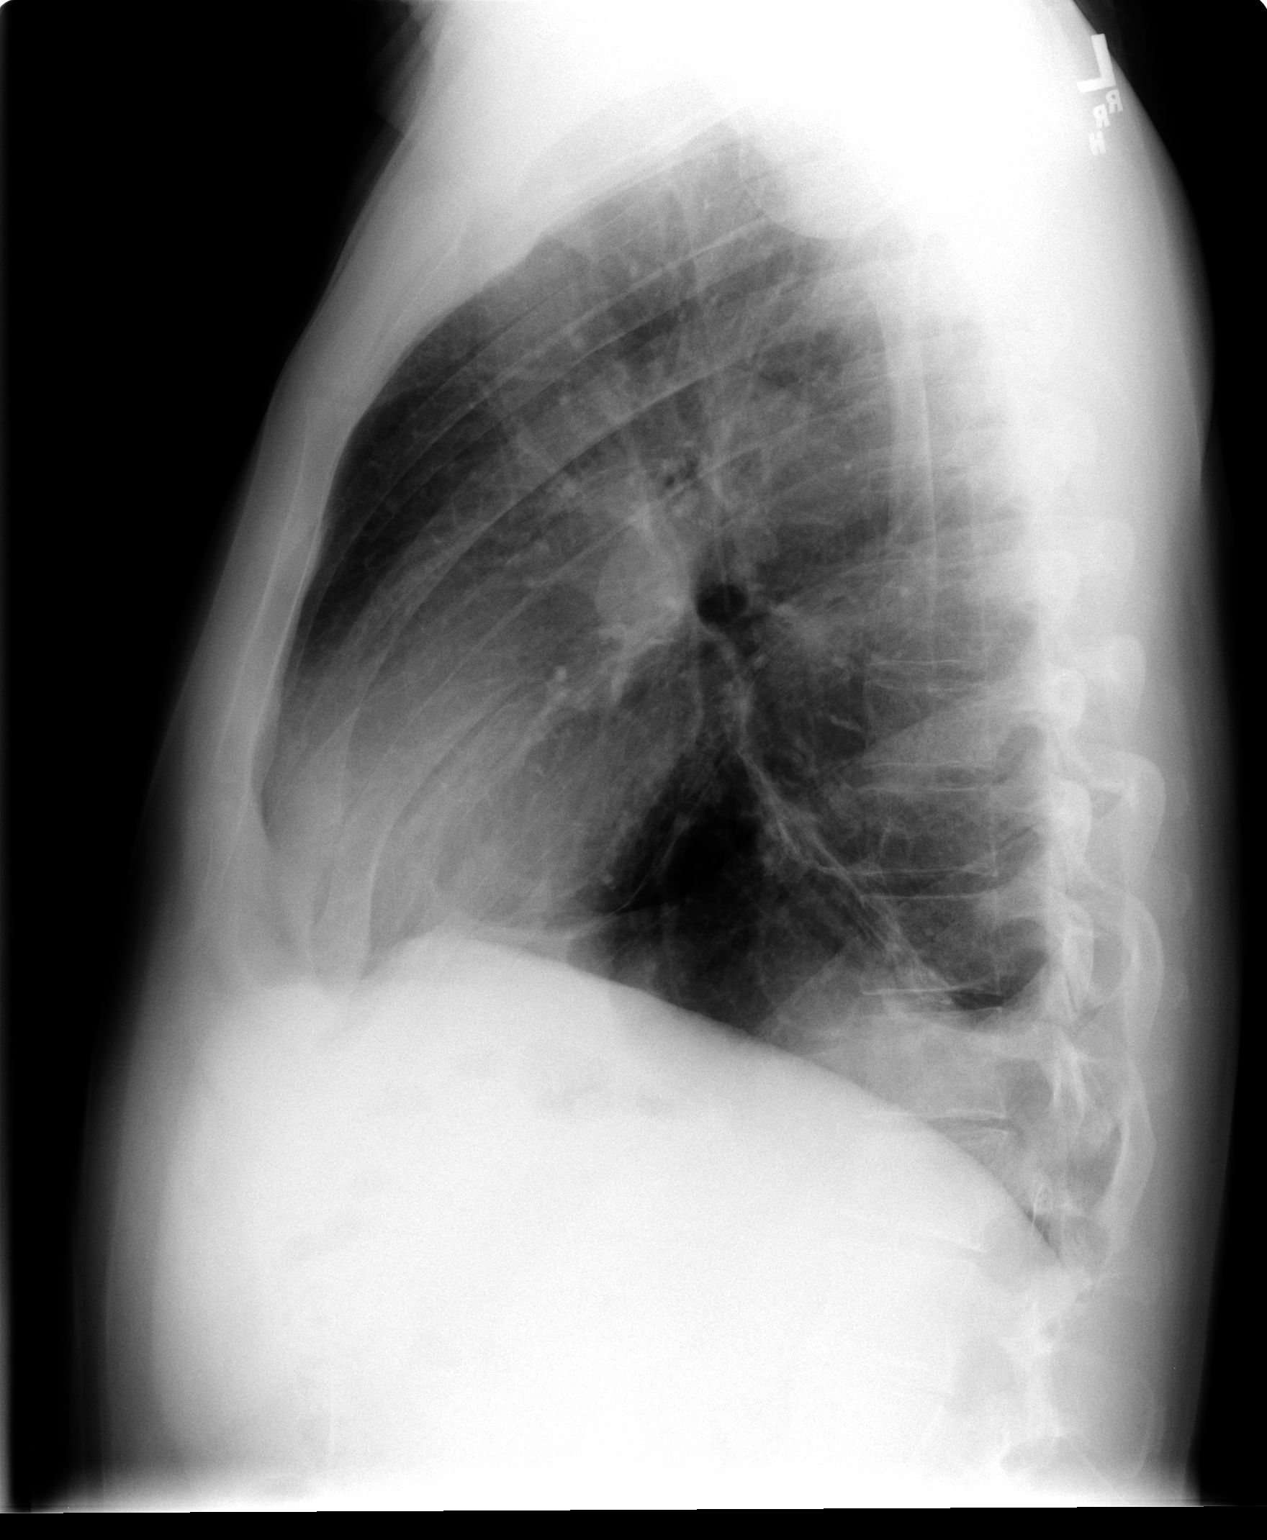

[2 of 2 positions shown; findings below may reference images not displayed]

FINDINGS: Stable surgical changes from a partial left lower lobectomy.  Stable pleural thickening.  No acute cardiopulmonary findings.  The heart size is within normal limits and stable.  Mediastinal contours are stable.  Bony structures are intact.
IMPRESSION: No acute cardiopulmonary findings.  Stable surgical changes on the left.

## 2008-08-05 ENCOUNTER — Emergency Department (HOSPITAL_COMMUNITY): Admission: EM | Admit: 2008-08-05 | Discharge: 2008-08-06 | Payer: Self-pay | Admitting: Emergency Medicine

## 2009-01-09 IMAGING — CR DG CHEST 1V
1 series · 1 of 1 positions shown · non-contrast
Comparison: 11/16/2005.

Exam: Chest, one view.

HISTORY: Detox from substance abuse.

[view not recorded]
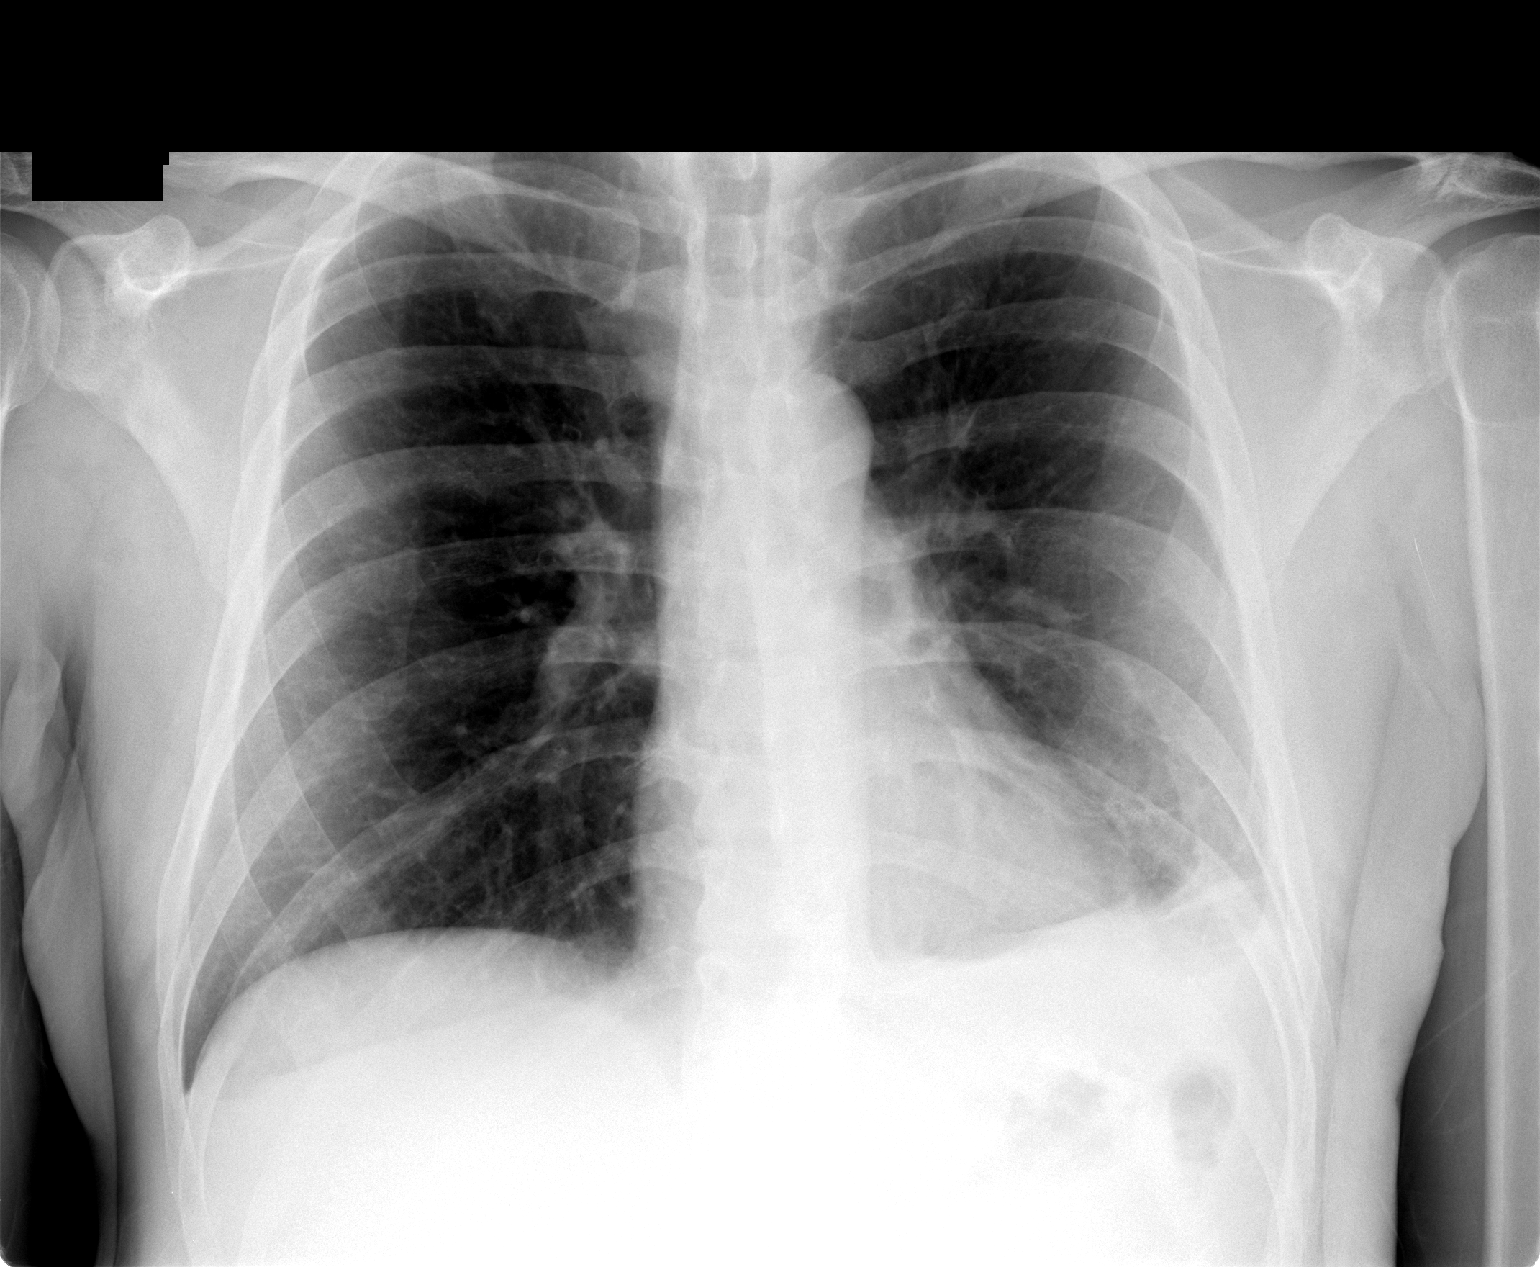

[1 of 1 positions shown; findings below may reference images not displayed]

FINDINGS: Again seen is volume loss, and scarring at the left lung base.

Suture material is identified at the left base and presumably this represents an
area postoperative change.

The remaining lungs are clear.

The heart size is normal.

No acute air space opacities noted.
IMPRESSION: 1. Stable postop changes at the left base. 
2. No acute findings.

## 2009-01-30 ENCOUNTER — Other Ambulatory Visit: Payer: Self-pay | Admitting: Emergency Medicine

## 2009-01-30 ENCOUNTER — Other Ambulatory Visit: Payer: Self-pay

## 2009-01-31 ENCOUNTER — Ambulatory Visit: Payer: Self-pay | Admitting: Psychiatry

## 2009-01-31 ENCOUNTER — Inpatient Hospital Stay (HOSPITAL_COMMUNITY): Admission: RE | Admit: 2009-01-31 | Discharge: 2009-02-03 | Payer: Self-pay | Admitting: Psychiatry

## 2009-02-23 IMAGING — CR DG HIP 1V*R*
1 series · 1 of 1 positions shown · non-contrast
Comparison: none

HISTORY: Reduction of right hip dislocation

RIGHT HIP ONE VIEW:
Portable exam 3036 hours.
Dislocation of right prosthesis appears reduced on single AP view.
No fracture or acute complication.

[view not recorded]
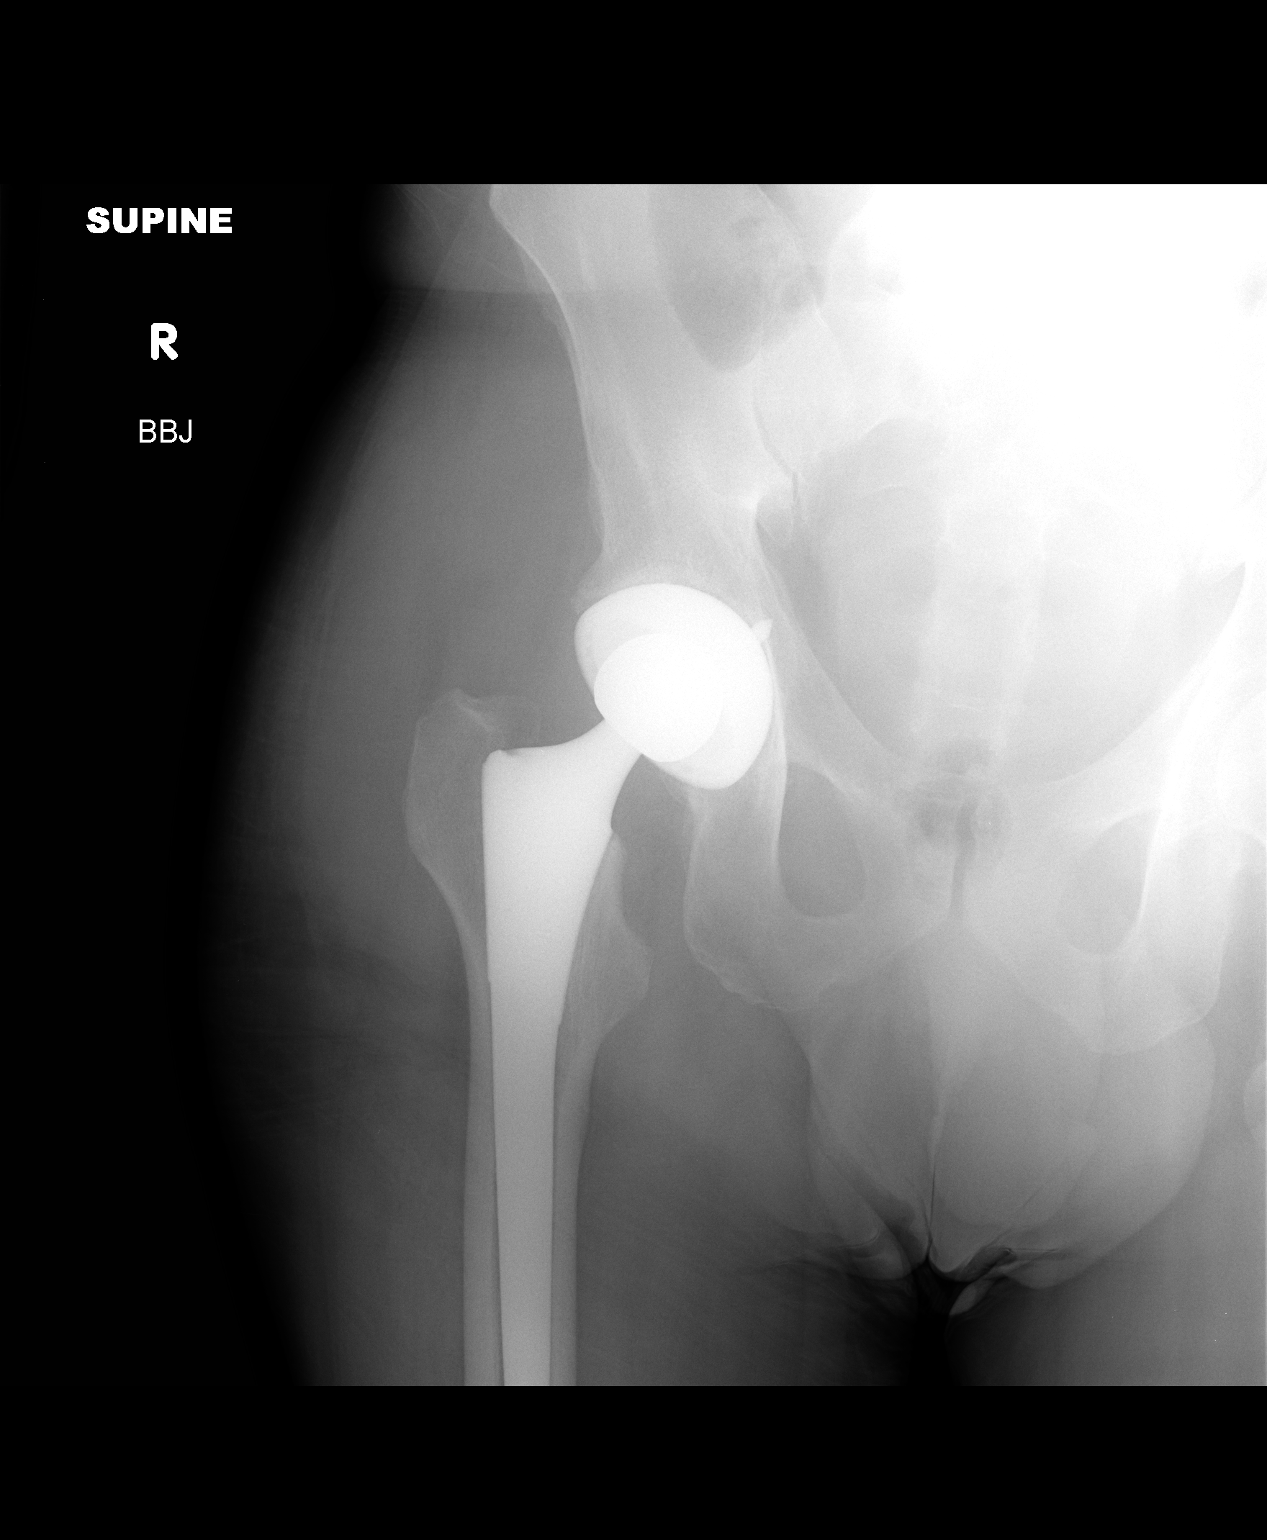

[1 of 1 positions shown; findings below may reference images not displayed]

IMPRESSION: Reduction of dislocated right hip prosthesis on AP view.

## 2009-03-20 ENCOUNTER — Encounter: Admission: RE | Admit: 2009-03-20 | Discharge: 2009-03-20 | Payer: Self-pay | Admitting: Rheumatology

## 2009-03-24 IMAGING — CR DG HIP COMPLETE 2+V*R*
4 series · 4 of 4 positions shown · non-contrast
Comparison: none

CLINICAL DATA: Hip pain. 
 RIGHT HIP - 2 VIEW:

[view not recorded (1 of 4)]
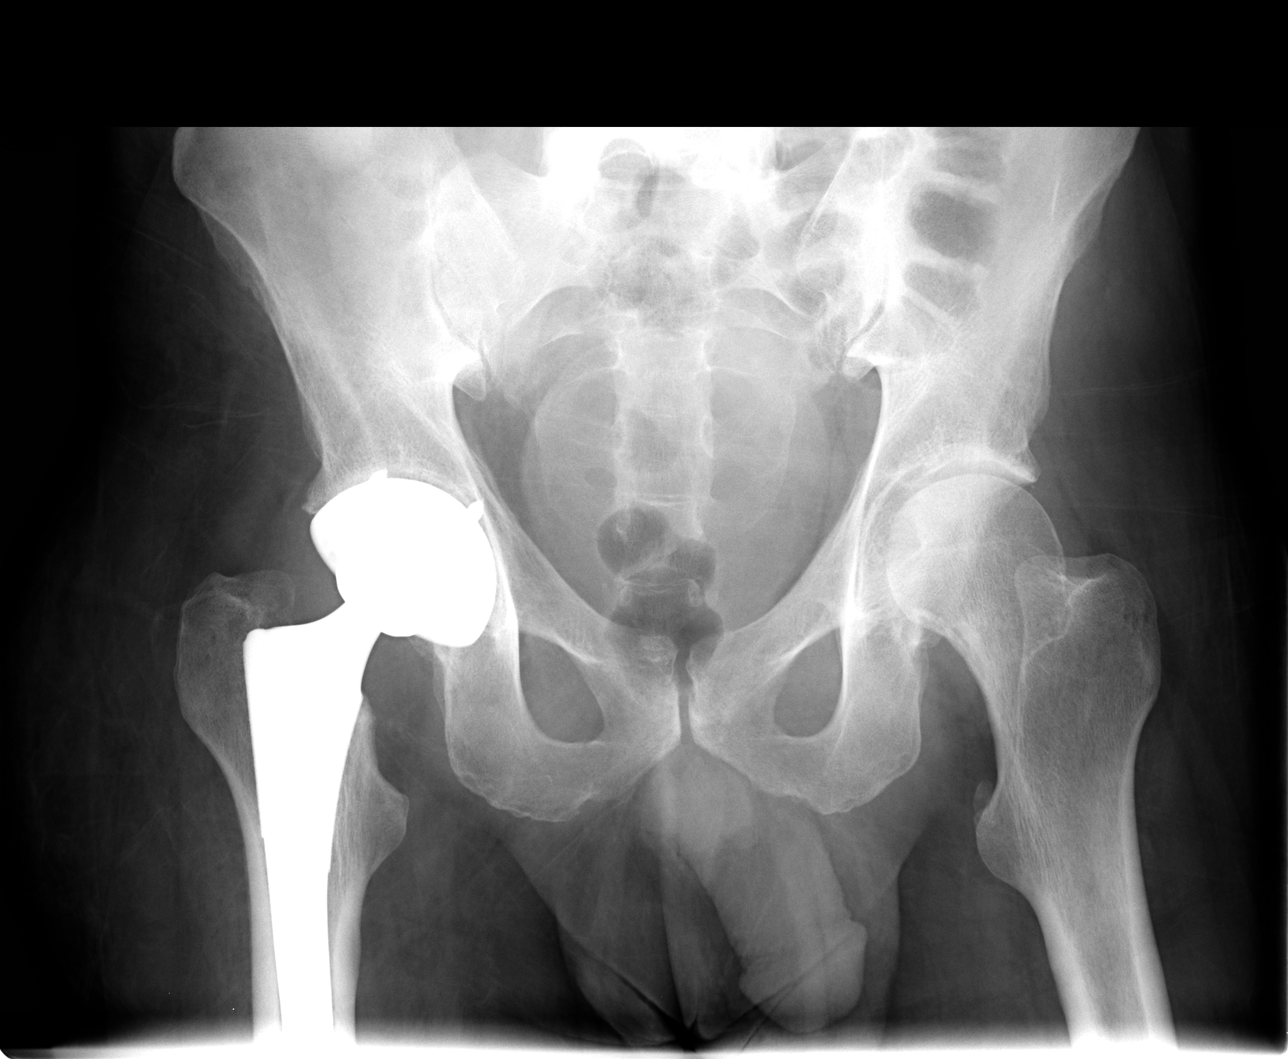

[view not recorded (2 of 4)]
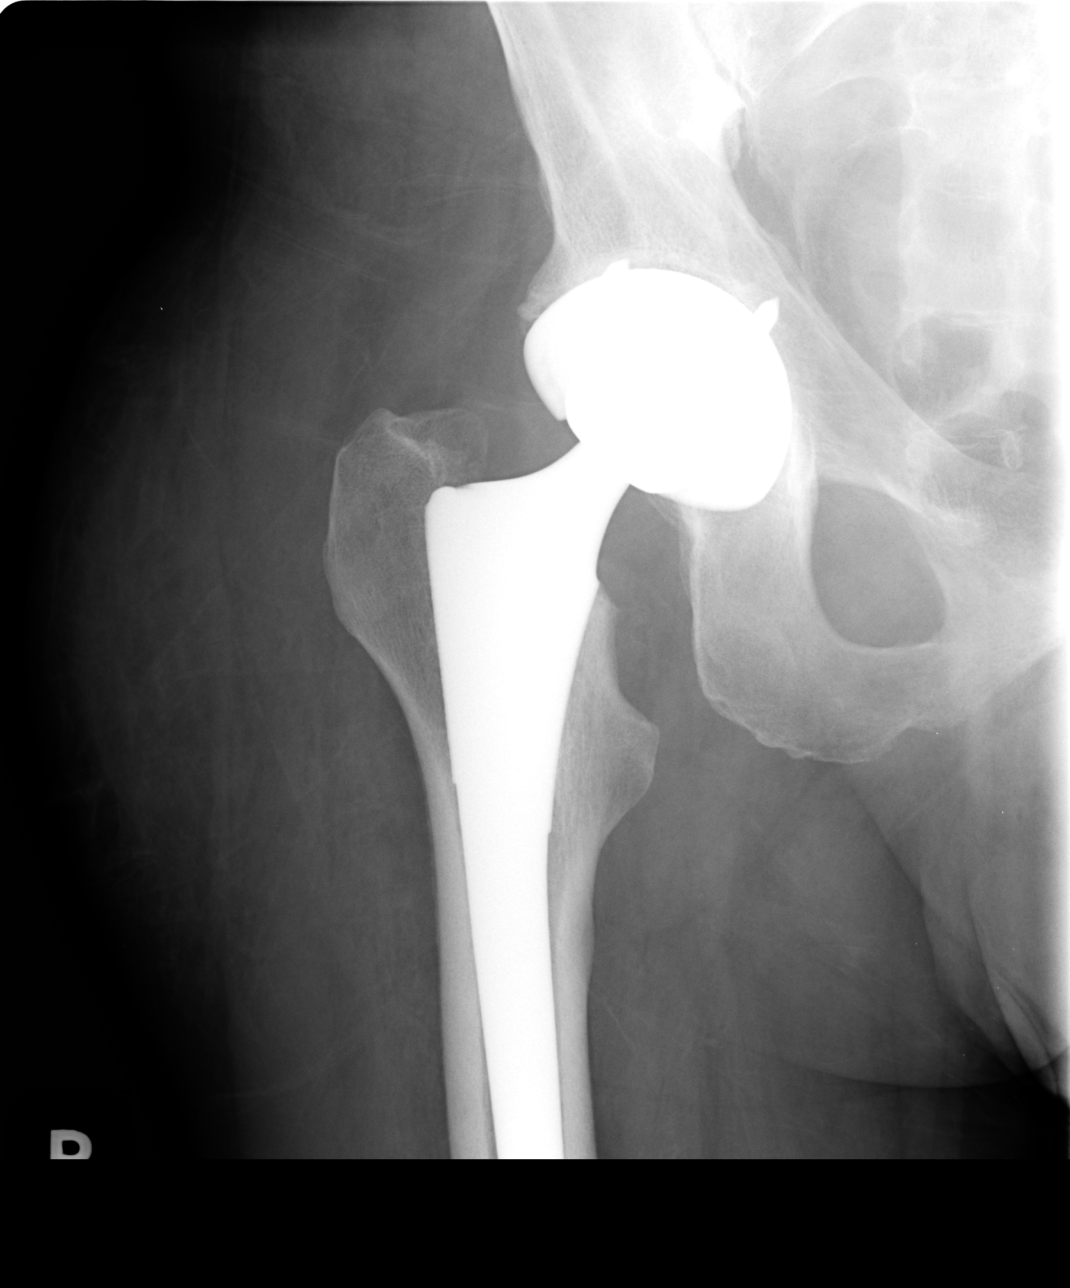

[view not recorded (3 of 4)]
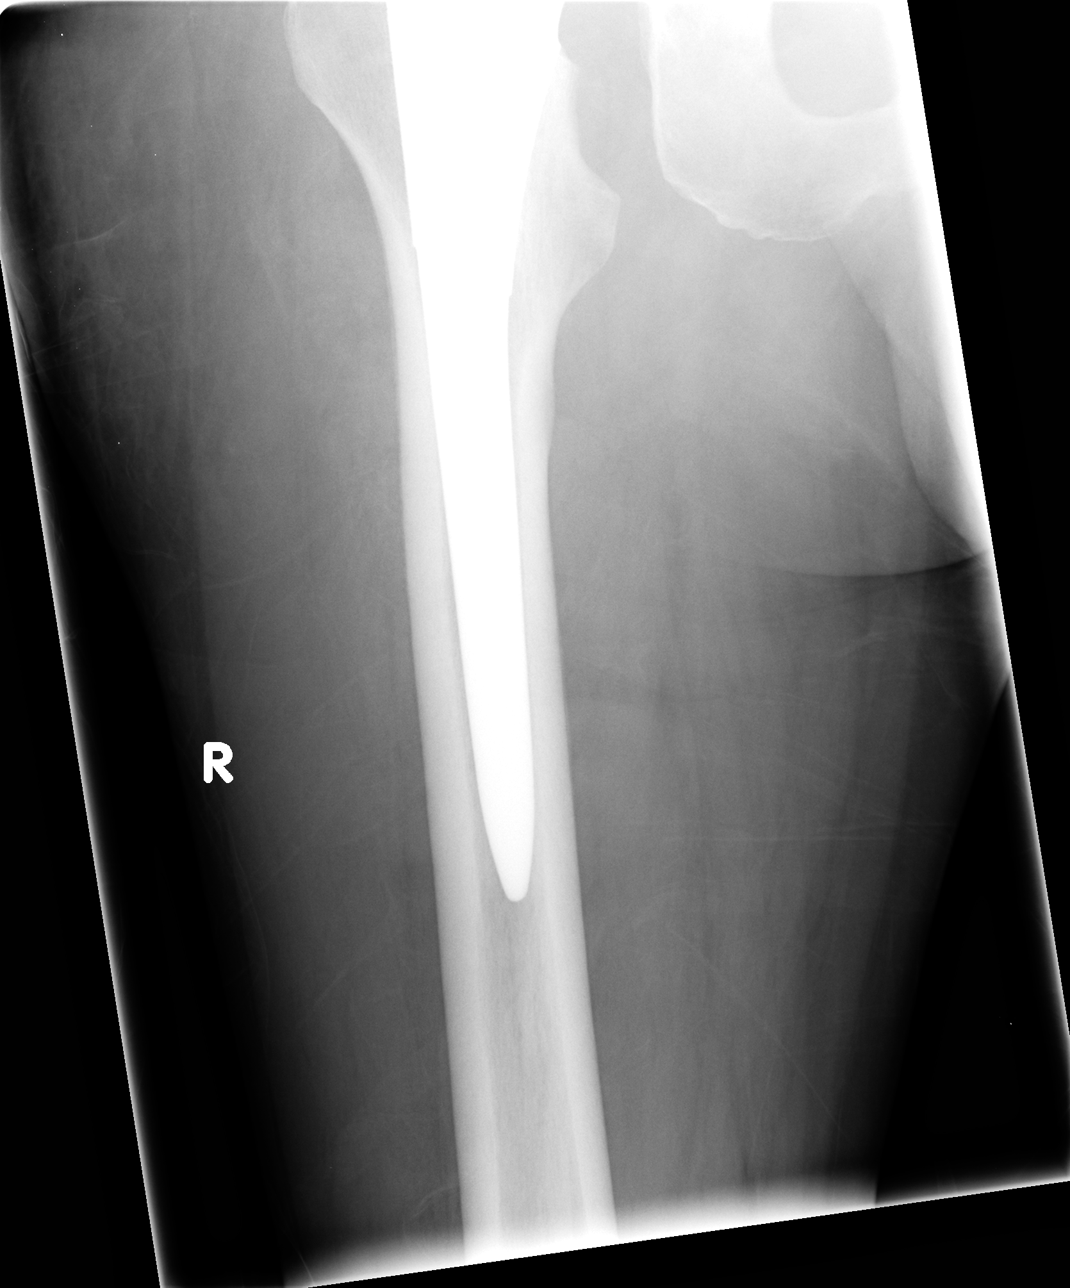

[view not recorded (4 of 4)]
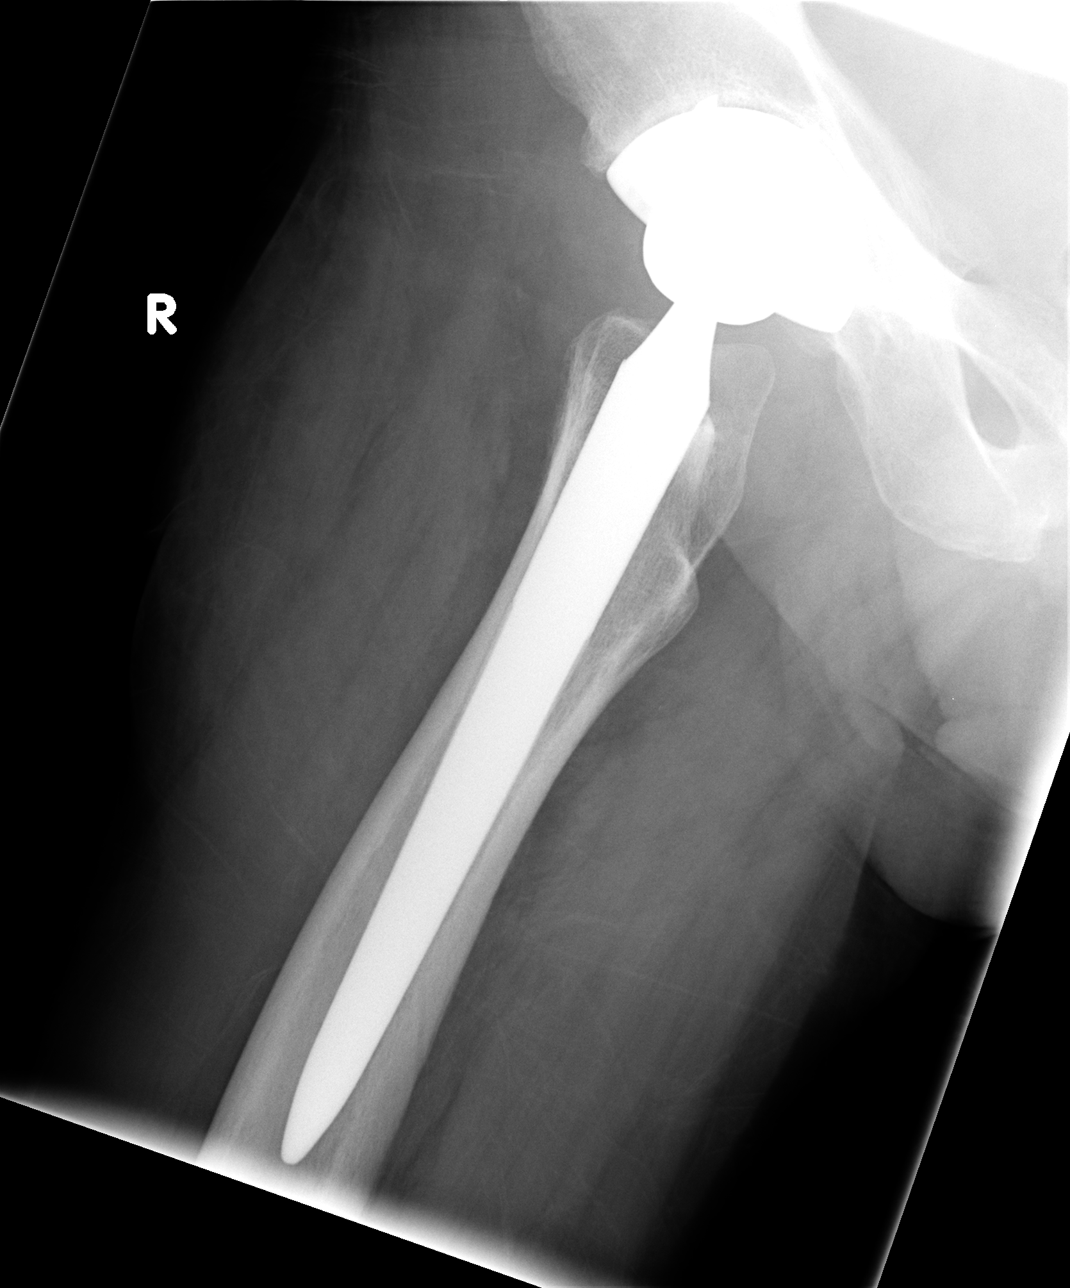

[4 of 4 positions shown; findings below may reference images not displayed]

FINDINGS: There is a total hip prosthesis on the right. Normal alignment. No fracture.
IMPRESSION: Satisfactory appearance of right hip replacement.

## 2009-03-29 ENCOUNTER — Other Ambulatory Visit: Payer: Self-pay | Admitting: Emergency Medicine

## 2009-03-30 ENCOUNTER — Ambulatory Visit: Payer: Self-pay | Admitting: Psychiatry

## 2009-03-30 ENCOUNTER — Inpatient Hospital Stay (HOSPITAL_COMMUNITY): Admission: RE | Admit: 2009-03-30 | Discharge: 2009-04-02 | Payer: Self-pay | Admitting: Psychiatry

## 2009-03-30 ENCOUNTER — Other Ambulatory Visit: Payer: Self-pay | Admitting: Emergency Medicine

## 2009-05-15 IMAGING — RF DG HIP 1V PORT*R*
1 series · 2 of 2 positions shown · non-contrast
Comparison: Radiographs earlier the same date.

CLINICAL DATA: Right total hip dislocation.  Post-reduction.
 PORTABLE RIGHT HIP ? 2 VIEW:

[Series 1: run · 2 of 2 slices shown]
[im 1/2]
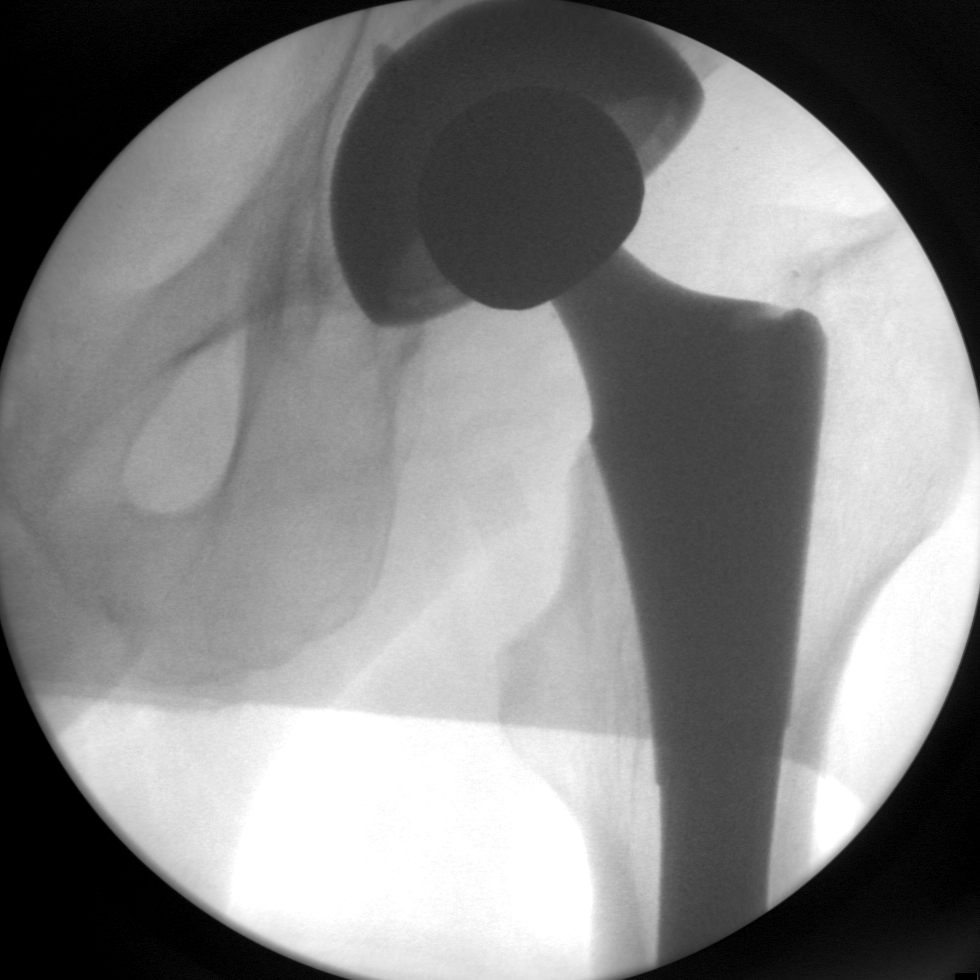
[im 2/2]
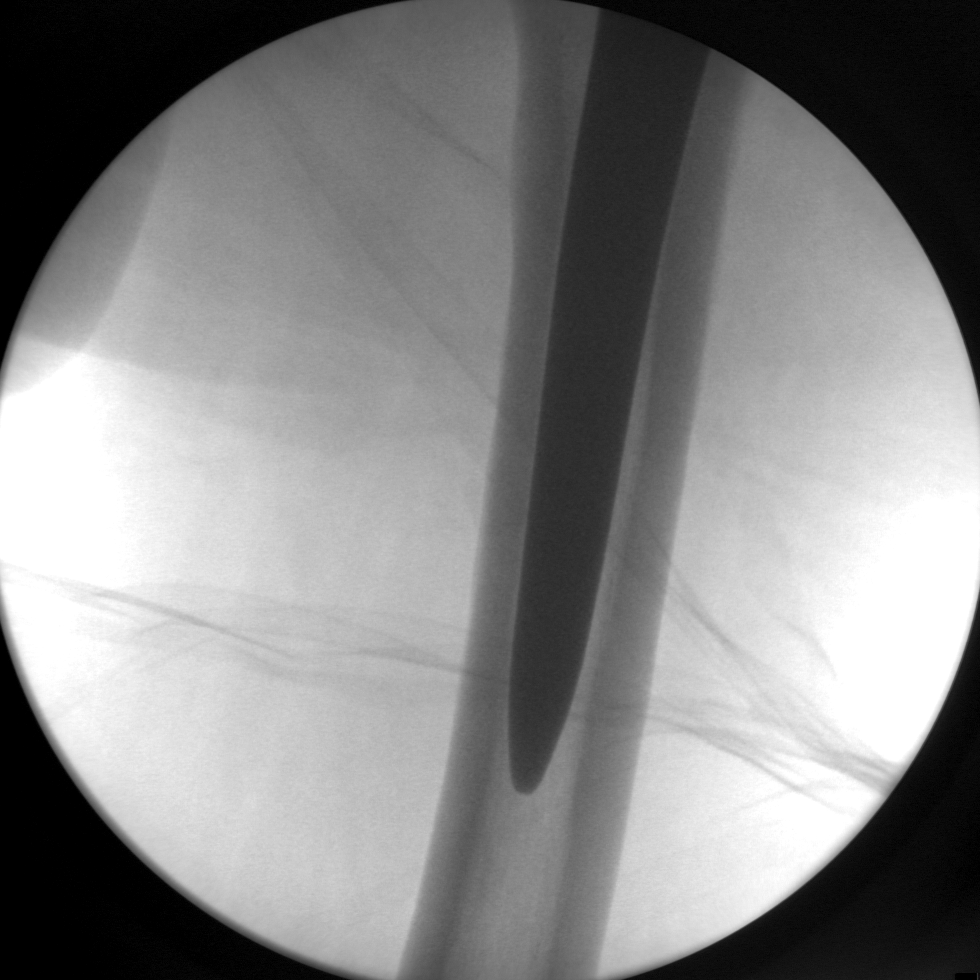

[2 of 2 positions shown; findings below may reference images not displayed]

FINDINGS: Two spot fluoroscopic images demonstrate reduction of the right total hip replacement.  There is no evidence of acute fracture.
IMPRESSION: Successful reduction of dislocated right hip.

## 2009-11-15 ENCOUNTER — Inpatient Hospital Stay (HOSPITAL_COMMUNITY): Admission: EM | Admit: 2009-11-15 | Discharge: 2009-11-19 | Payer: Self-pay | Admitting: Emergency Medicine

## 2010-04-02 LAB — URINALYSIS, ROUTINE W REFLEX MICROSCOPIC
Glucose, UA: NEGATIVE mg/dL
Hgb urine dipstick: NEGATIVE
Protein, ur: NEGATIVE mg/dL
Specific Gravity, Urine: 1.02 (ref 1.005–1.030)
Urobilinogen, UA: 0.2 mg/dL (ref 0.0–1.0)

## 2010-04-02 LAB — COMPREHENSIVE METABOLIC PANEL
ALT: 11 U/L (ref 0–53)
AST: 19 U/L (ref 0–37)
CO2: 27 mEq/L (ref 19–32)
Chloride: 108 mEq/L (ref 96–112)
Creatinine, Ser: 0.89 mg/dL (ref 0.4–1.5)
GFR calc Af Amer: >60 mL/min (ref >60)
GFR calc non Af Amer: >60 mL/min (ref >60)
Glucose, Bld: 116 mg/dL — ABNORMAL HIGH (ref 70–99)
Total Bilirubin: 0.3 mg/dL (ref 0.3–1.2)

## 2010-04-02 LAB — CBC
HCT: 37.2 % — ABNORMAL LOW (ref 39.0–52.0)
Hemoglobin: 12.2 g/dL — ABNORMAL LOW (ref 13.0–17.0)
Hemoglobin: 12.8 g/dL — ABNORMAL LOW (ref 13.0–17.0)
MCH: 28.8 pg (ref 26.0–34.0)
MCH: 28.8 pg (ref 26.0–34.0)
MCH: 28.8 pg (ref 26.0–34.0)
MCHC: 32.9 g/dL (ref 30.0–36.0)
MCV: 87.5 fL (ref 78.0–100.0)
MCV: 88.7 fL (ref 78.0–100.0)
Platelets: 152 10*3/uL (ref 150–400)
RBC: 4.24 MIL/uL (ref 4.22–5.81)
RBC: 4.44 MIL/uL (ref 4.22–5.81)
RDW: 14 % (ref 11.5–15.5)
WBC: 4.7 10*3/uL (ref 4.0–10.5)

## 2010-04-02 LAB — DIFFERENTIAL
Basophils Absolute: 0 10*3/uL (ref 0.0–0.1)
Basophils Absolute: 0 10*3/uL (ref 0.0–0.1)
Eosinophils Absolute: 0.2 10*3/uL (ref 0.0–0.7)
Eosinophils Absolute: 0.2 10*3/uL (ref 0.0–0.7)
Eosinophils Absolute: 0.2 10*3/uL (ref 0.0–0.7)
Eosinophils Relative: 2 % (ref 0–5)
Eosinophils Relative: 4 % (ref 0–5)
Eosinophils Relative: 5 % (ref 0–5)
Lymphocytes Relative: 36 % (ref 12–46)
Lymphocytes Relative: 42 % (ref 12–46)
Lymphs Abs: 1.8 10*3/uL (ref 0.7–4.0)
Monocytes Absolute: 0.8 10*3/uL (ref 0.1–1.0)
Monocytes Relative: 9 % (ref 3–12)
Neutrophils Relative %: 41 % — ABNORMAL LOW (ref 43–77)

## 2010-04-02 LAB — BASIC METABOLIC PANEL
BUN: 6 mg/dL (ref 6–23)
CO2: 28 mEq/L (ref 19–32)
Calcium: 8.3 mg/dL — ABNORMAL LOW (ref 8.4–10.5)
Chloride: 104 mEq/L (ref 96–112)
Chloride: 106 mEq/L (ref 96–112)
Creatinine, Ser: 0.97 mg/dL (ref 0.4–1.5)
GFR calc Af Amer: >60 mL/min (ref >60)
Glucose, Bld: 125 mg/dL — ABNORMAL HIGH (ref 70–99)
Sodium: 138 mEq/L (ref 135–145)

## 2010-04-02 LAB — MAGNESIUM: Magnesium: 2.3 mg/dL (ref 1.5–2.5)

## 2010-04-02 LAB — RAPID URINE DRUG SCREEN, HOSP PERFORMED
Barbiturates: NOT DETECTED
Benzodiazepines: POSITIVE — AB
Opiates: POSITIVE — AB

## 2010-04-02 LAB — CULTURE, BLOOD (ROUTINE X 2): Culture: NO GROWTH

## 2010-04-02 LAB — PROTIME-INR: Prothrombin Time: 12.7 seconds (ref 11.6–15.2)

## 2010-04-02 LAB — TSH: TSH: 4.907 u[IU]/mL — ABNORMAL HIGH (ref 0.350–4.500)

## 2010-04-02 LAB — IRON AND TIBC: Iron: 22 ug/dL — ABNORMAL LOW (ref 42–135)

## 2010-04-02 LAB — RETICULOCYTES
Retic Count, Absolute: 44.6 10*3/uL (ref 19.0–186.0)
Retic Ct Pct: 1 % (ref 0.4–3.1)

## 2010-04-02 LAB — VANCOMYCIN, TROUGH: Vancomycin Tr: 12 ug/mL (ref 10.0–20.0)

## 2010-04-02 LAB — HEMOGLOBIN A1C: Mean Plasma Glucose: 105 mg/dL (ref <117)

## 2010-04-06 LAB — BASIC METABOLIC PANEL
Chloride: 99 mEq/L (ref 96–112)
GFR calc Af Amer: >60 mL/min (ref >60)
Potassium: 3.3 mEq/L — ABNORMAL LOW (ref 3.5–5.1)

## 2010-04-06 LAB — CBC
HCT: 45.3 % (ref 39.0–52.0)
HCT: 42 % (ref 39.0–52.0)
Hemoglobin: 14.8 g/dL (ref 13.0–17.0)
MCV: 81.9 fL (ref 78.0–100.0)
MCV: 82.8 fL (ref 78.0–100.0)
Platelets: 140 10*3/uL — ABNORMAL LOW (ref 150–400)
RBC: 5.53 MIL/uL (ref 4.22–5.81)
WBC: 8.8 10*3/uL (ref 4.0–10.5)
WBC: 8.2 10*3/uL (ref 4.0–10.5)

## 2010-04-06 LAB — HEPATIC FUNCTION PANEL
Alkaline Phosphatase: 81 U/L (ref 39–117)
Indirect Bilirubin: 0.7 mg/dL (ref 0.3–0.9)
Total Bilirubin: 0.9 mg/dL (ref 0.3–1.2)
Total Protein: 6.7 g/dL (ref 6.0–8.3)

## 2010-04-06 LAB — ETHANOL: Alcohol, Ethyl (B): 297 mg/dL — ABNORMAL HIGH (ref 0–10)

## 2010-04-06 LAB — DIFFERENTIAL
Eosinophils Absolute: 0 10*3/uL (ref 0.0–0.7)
Eosinophils Absolute: 0.1 10*3/uL (ref 0.0–0.7)
Eosinophils Relative: 1 % (ref 0–5)
Eosinophils Relative: 1 % (ref 0–5)
Lymphs Abs: 3 10*3/uL (ref 0.7–4.0)
Lymphs Abs: 2.1 10*3/uL (ref 0.7–4.0)
Monocytes Absolute: 0.7 10*3/uL (ref 0.1–1.0)
Monocytes Relative: 8 % (ref 3–12)

## 2010-04-13 LAB — RAPID URINE DRUG SCREEN, HOSP PERFORMED
Amphetamines: NOT DETECTED
Barbiturates: NOT DETECTED
Benzodiazepines: NOT DETECTED
Cocaine: POSITIVE — AB
Opiates: POSITIVE — AB

## 2010-04-13 LAB — COMPREHENSIVE METABOLIC PANEL
ALT: 37 U/L (ref 0–53)
Alkaline Phosphatase: 99 U/L (ref 39–117)
BUN: 4 mg/dL — ABNORMAL LOW (ref 6–23)
Chloride: 101 mEq/L (ref 96–112)
Glucose, Bld: 129 mg/dL — ABNORMAL HIGH (ref 70–99)
Potassium: 3.5 mEq/L (ref 3.5–5.1)
Total Bilirubin: 0.6 mg/dL (ref 0.3–1.2)

## 2010-04-13 LAB — CBC
HCT: 43.2 % (ref 39.0–52.0)
Hemoglobin: 14.6 g/dL (ref 13.0–17.0)
MCV: 80.2 fL (ref 78.0–100.0)
RBC: 5.38 MIL/uL (ref 4.22–5.81)
RDW: 16.2 % — ABNORMAL HIGH (ref 11.5–15.5)
WBC: 6.6 10*3/uL (ref 4.0–10.5)

## 2010-04-13 LAB — URINALYSIS, ROUTINE W REFLEX MICROSCOPIC
Glucose, UA: NEGATIVE mg/dL
Hgb urine dipstick: NEGATIVE
Ketones, ur: NEGATIVE mg/dL
Specific Gravity, Urine: <1.005 — ABNORMAL LOW (ref 1.005–1.030)
Urobilinogen, UA: 0.2 mg/dL (ref 0.0–1.0)
pH: 5 (ref 5.0–8.0)

## 2010-04-13 LAB — ETHANOL: Alcohol, Ethyl (B): 310 mg/dL — ABNORMAL HIGH (ref 0–10)

## 2010-04-13 LAB — DIFFERENTIAL
Basophils Absolute: 0 10*3/uL (ref 0.0–0.1)
Basophils Relative: 1 % (ref 0–1)
Eosinophils Absolute: 0.1 10*3/uL (ref 0.0–0.7)
Monocytes Absolute: 0.6 10*3/uL (ref 0.1–1.0)
Neutro Abs: 3 10*3/uL (ref 1.7–7.7)
Neutrophils Relative %: 45 % (ref 43–77)

## 2010-04-14 LAB — CBC
HCT: 43.5 % (ref 39.0–52.0)
Hemoglobin: 13.7 g/dL (ref 13.0–17.0)
MCHC: 31.6 g/dL (ref 30.0–36.0)
MCV: 84.1 fL (ref 78.0–100.0)
Platelets: 135 10*3/uL — ABNORMAL LOW (ref 150–400)
RDW: 16 % — ABNORMAL HIGH (ref 11.5–15.5)

## 2010-04-14 LAB — URINALYSIS, ROUTINE W REFLEX MICROSCOPIC
Bilirubin Urine: NEGATIVE
Glucose, UA: 100 mg/dL — AB
Ketones, ur: NEGATIVE mg/dL
Protein, ur: NEGATIVE mg/dL

## 2010-04-14 LAB — DIFFERENTIAL
Basophils Relative: 0 % (ref 0–1)
Lymphocytes Relative: 11 % — ABNORMAL LOW (ref 12–46)
Lymphs Abs: 1.3 10*3/uL (ref 0.7–4.0)
Monocytes Absolute: 0.4 10*3/uL (ref 0.1–1.0)
Monocytes Relative: 4 % (ref 3–12)
Neutro Abs: 9.3 10*3/uL — ABNORMAL HIGH (ref 1.7–7.7)
Neutrophils Relative %: 83 % — ABNORMAL HIGH (ref 43–77)

## 2010-04-14 LAB — COMPREHENSIVE METABOLIC PANEL
Albumin: 4 g/dL (ref 3.5–5.2)
BUN: 7 mg/dL (ref 6–23)
Calcium: 9 mg/dL (ref 8.4–10.5)
Creatinine, Ser: 0.83 mg/dL (ref 0.4–1.5)
Glucose, Bld: 126 mg/dL — ABNORMAL HIGH (ref 70–99)
Potassium: 3.3 mEq/L — ABNORMAL LOW (ref 3.5–5.1)
Total Protein: 7.1 g/dL (ref 6.0–8.3)

## 2010-04-27 LAB — COMPREHENSIVE METABOLIC PANEL
AST: 144 U/L — ABNORMAL HIGH (ref 0–37)
Albumin: 3.7 g/dL (ref 3.5–5.2)
Alkaline Phosphatase: 144 U/L — ABNORMAL HIGH (ref 39–117)
BUN: 3 mg/dL — ABNORMAL LOW (ref 6–23)
CO2: 28 mEq/L (ref 19–32)
Chloride: 102 mEq/L (ref 96–112)
Creatinine, Ser: 1.03 mg/dL (ref 0.4–1.5)
GFR calc non Af Amer: >60 mL/min (ref >60)
Potassium: 3.5 mEq/L (ref 3.5–5.1)
Total Bilirubin: 0.4 mg/dL (ref 0.3–1.2)

## 2010-04-27 LAB — ETHANOL
Alcohol, Ethyl (B): 228 mg/dL — ABNORMAL HIGH (ref 0–10)
Alcohol, Ethyl (B): 412 mg/dL (ref 0–10)
Alcohol, Ethyl (B): <5 mg/dL (ref 0–10)

## 2010-04-27 LAB — URINALYSIS, ROUTINE W REFLEX MICROSCOPIC
Bilirubin Urine: NEGATIVE
Ketones, ur: NEGATIVE mg/dL
Nitrite: NEGATIVE
Protein, ur: NEGATIVE mg/dL
Urobilinogen, UA: 0.2 mg/dL (ref 0.0–1.0)

## 2010-04-27 LAB — CBC
HCT: 48.3 % (ref 39.0–52.0)
MCV: 83.6 fL (ref 78.0–100.0)
Platelets: 118 10*3/uL — ABNORMAL LOW (ref 150–400)
RBC: 5.77 MIL/uL (ref 4.22–5.81)
WBC: 5.8 10*3/uL (ref 4.0–10.5)

## 2010-04-27 LAB — DIFFERENTIAL
Basophils Absolute: 0.2 10*3/uL — ABNORMAL HIGH (ref 0.0–0.1)
Eosinophils Absolute: 0.1 10*3/uL (ref 0.0–0.7)
Lymphocytes Relative: 40 % (ref 12–46)
Lymphs Abs: 2.3 10*3/uL (ref 0.7–4.0)
Neutro Abs: 2.7 10*3/uL (ref 1.7–7.7)

## 2010-04-27 LAB — RAPID URINE DRUG SCREEN, HOSP PERFORMED: Tetrahydrocannabinol: NOT DETECTED

## 2010-06-03 NOTE — Consult Note (Signed)
NAMECOBE, VINEY NO.:  [id number]   MEDICAL RECORD NO.:  [id number]          PATIENT TYPE:  EMS   LOCATION:  ED                            FACILITY:  [facility]   PHYSICIAN:  Vickki Hearing, M.D.DATE OF BIRTH:  April 04, 1965   DATE OF CONSULTATION:  DATE OF DISCHARGE:                                 CONSULTATION   REFERRING PHYSICIAN:  Alvin Critchley, M.D.   CHIEF COMPLAINT:  Right hip dislocation and right hip pain.   HISTORY:  This is a 45 year old male who had a total hip replacement at  [facility] approximately 5 weeks ago.  He was in his normal state  of health and doing well when he externally rotated his leg, leaned to  his left and his hip popped out of place.  He experienced immediate  severe pain in his right groin with limb deformity and he was brought to  the hospital by emergency medical services.  Dr. Neale Burly evaluated him  in the emergency room; he was neurovascularly intact, in severe pain.  Radiographs revealed a right hip dislocation.  The patient reports a Dr.  Daphine Deutscher at [facility] saw him and did his hip replacement after  referral was made by Dr. Hilda Lias, does not do total hip replacements.   MEDICATIONS:  He reports that he is on no medication.   ALLERGIES:  He has no allergies.   MEDICAL HISTORY:  Only significant for arthritis.   SOCIAL HISTORY:  Positive smoking, occasional drinking.   FAMILY HISTORY:  Negative.   REVIEW OF SYSTEMS:  The patient says he was doing well with no  complaints.   PHYSICAL EXAMINATION:  VITAL SIGNS:  Temperature 98.4, blood pressure  was 110/56, pulse 92, respiratory rate 22, O2 SAT 99%.  APPEARANCE:  He was of normal build, normal development, grooming and  hygiene with deformity of the right hip with shortening.  EXTREMITIES:  Incision looked fine; it seemed to be mini-type incision  from the trochanter proximal; it was well-healed.  No signs of  infection.  Pulses were excellent.   There was no peripheral edema.  His  upper extremities showed no evidence of deformity, no muscle atrophy, no  joint subluxation, no tremor and normal movement.  In his left lower  extremity, there was a total knee incision which was well-healed.  There  is no deformity and he had good muscle strength and tone and no  dislocation.  Right leg again with shortened.  pulses were fine.  Neurovascular exam  was normal.   EMERGENCY DEPARTMENT COURSE:  The patient was consented for conscious  sedation.   A closed reduction was performed.  Post reduction x-rays showed hip to  be in place.   He will be discharged home with a prescription for pain medication and  instructions to call his surgeon today to let him know of the events of  the day.  He will be sent home with an abduction pillow, a walker if he  needs it and to follow up at [facility].      Vickki Hearing,  M.D.  Electronically Signed     SEH/MEDQ  D:  06/11/2006  T:  06/11/2006  Job:  [id number]

## 2010-06-03 NOTE — H&P (Signed)
NAMEFRANCHOT, [name] NO.:  [id number]   MEDICAL RECORD NO.:  [id number]          PATIENT TYPE:  IPS   LOCATION:  0505                          [facility]   PHYSICIAN:  [name], M.D.      DATE OF BIRTH:  25-Dec-1965   DATE OF ADMISSION:  08/16/2006  DATE OF DISCHARGE:                       PSYCHIATRIC ADMISSION ASSESSMENT   IDENTIFYING INFORMATION:  This is a 45 year old divorced male  voluntarily admitted on August 16, 2006.   HISTORY OF PRESENT ILLNESS:  The patient presents with a history of  alcohol use.  Patient has been drinking beer up to 24-30 daily.  His  last drink was day prior to this admission.  Denies any blackouts or  seizure activity.  He states he has never been detoxed before.  He  states he is just tired of drinking.  Denies any depression.  Denies  any suicidal thoughts.  Normally drinks alone.  Reports decreased sleep.  His appetite has been satisfactory.  He has been drinking since the age  of 58.  He states he ran out of medications two days prior to admission.   PAST PSYCHIATRIC HISTORY:  First admission to the [facility].  Does not have any current outpatient mental health treatment.   SOCIAL HISTORY:  This is a 45 year old divorced white male, has 20-year-  old son, lives alone.  He is unemployed.   FAMILY HISTORY:  Father with alcohol, recently had a stroke.   ALCOHOL/DRUG HISTORY:  The patient smokes and alcohol and drug habits as  described above.   PRIMARY CARE PHYSICIAN:  Primary care [name] is Dr. Daphine Palmer and Dr.  Clelia Palmer in [place] who prescribes his Klonopin.   MEDICAL PROBLEMS:  The patient had a right hip replacement on May 04, 2006 at [facility].  He has had two hip dislocations since.  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   MEDICATIONS:  He is on Percocet and Klonopin.  Percocet prescribed by  Dr. Daphine Palmer and Klonopin prescribed by Dr. Clelia Palmer in [place].  The patient  gets his prescriptions at  [facility] in [place].   ALLERGIES:  No known allergies.   PHYSICAL EXAMINATION:  This is a middle-aged male who is unsteady, thin,  seems somewhat malnourished, has a healed scar to his left knee.  He was  assessed at [facility].  Temperature is 97.3, heart rate 91,  respirations 20, blood pressure 142/90.   LABORATORY DATA:  AST is elevated at 64 with a reference range of 0-37,  ALT is 33 with a reference range of 0-53.  Urine drug screen is  negative.  Platelet count is 136, potassium 3.4, glucose was 129.  Alcohol level was 245.   MENTAL STATUS EXAM:  He is fully alert, cooperative, casually dressed  with good eye contact.  Speech is clear.  The patient's mood is neutral.  The patient's affect is flat and seems to smile sometimes  inappropriately.  No evidence of any thought disorder.  Cognitive  function intact.  Memory is fair.  Judgment and insight  is fair.  Poor  impulse control.   DIAGNOSES:  AXIS I:  Alcohol dependence.  Rule out opiate and  benzodiazepine abuse.  AXIS II:  Deferred.  AXIS III:  Right hip replacement.  AXIS IV:  Medical problems.  AXIS V:  Current 40-45.   PLAN:  Contract for safety.  Stabilize mood and thinking.  The patient  is considered a fall risk.  Will put patient on Librium protocol.  Work  on relapse prevention.  Will clarify his medications.   TENTATIVE LENGTH OF STAY:  Three to four days.      Landry Corporal, N.P.      [name], M.D.  Electronically Signed    JO/MEDQ  D:  08/18/2006  T:  08/19/2006  Job:  [id number]

## 2010-06-03 NOTE — Consult Note (Signed)
[name], [name] NO.:  [id number]   MEDICAL RECORD NO.:  [id number]          PATIENT TYPE:  EMS   LOCATION:  ED                            [facility]   PHYSICIAN:  J. Darreld Mclean, M.D. DATE OF BIRTH:  1965-05-03   DATE OF CONSULTATION:  DATE OF DISCHARGE:  07/11/2006                                 CONSULTATION   HISTORY OF PRESENT ILLNESS:  The patient is a 45 year old male with  history of a recent hip replacement on the right.  On April 24, he  dislocated the right hip, seen by Dr. Romeo Apple and was relocated.  Comes  in tonight complaining of marked pain, tenderness and hip swelling.  Was  seen at [facility] after dislocation and was given a special  abduction brace to wear.  He was not wearing it tonight but said he had  been wearing it.  __________. He has some swelling around the right hip  area.  There is no fever or chills.  The patient smells of alcohol.  Drug screen and alcohol levels are pending.  Leg lengths are equal.  Neurovascularly, he appears intact.  The patient is alert, cooperative  and oriented.  He does not __________ words.   X-rays of the hip shows the hip is located with no obvious fracture.  There is no dislocation.   IMPRESSION:  1. __________ contusion right hip area.  2. Possible alcohol and possible drug abuse.  Labs are pending.   He has an appointment Monday with [facility] [place] and  will be seen there.  Told him in the future if he has problems with his  hip and needs to come in, he really needs to go to [facility] because if he  gets that again, he will need a revision.  Appears to understand that.           ______________________________  J. Darreld Mclean, M.D.     JWK/MEDQ  D:  07/11/2006  T:  07/11/2006  Job:  [id number]

## 2010-06-03 NOTE — Op Note (Signed)
NAMEKENECHUKWU, ECKSTEIN                 ACCOUNT NO.:  [id number]   MEDICAL RECORD NO.:  [id number]          PATIENT TYPE:  EMS   LOCATION:  ED                            [facility]   PHYSICIAN:  J. Darreld Mclean, M.D. DATE OF BIRTH:  09/29/1965   DATE OF PROCEDURE:  08/31/2006  DATE OF DISCHARGE:                               OPERATIVE REPORT   PREOPERATIVE DIAGNOSIS:  Dislocation of right total hip prosthesis  posteriorly.   POSTOPERATIVE DIAGNOSIS:  Dislocation of right total hip prosthesis  posteriorly.   OPERATION PERFORMED:  Closed reduction of right hip dislocation and  total hip prosthesis under anesthesia.   SURGEON:  J. Darreld Mclean, M.D.   ANESTHESIA:  General.   INDICATIONS FOR PROCEDURE:  Patient is a 45 year old male who had a  total hip replacement on the right at [facility] in April of this  year.  On May 23, his hip dislocated and was reduced here by Dr.  Romeo Apple.  He had an abduction brace prescribed at [facility] after that.  He was in __________ alcohol rehabilitation program and his hip came out  this past Friday last week.  Today is Tuesday.  He was seen at [facility] over the weekend on August 8 and had relocation of his hip.  He  was sent home on August 9.  Last night his hip came out while he was in  bed and he was brought to the emergency room early this morning.  He has  a new dislocation.  This is his third dislocation and second dislocation  in a week.  He is in extreme pain.  I talked to him and his family  member who was by phone.  She is now present, explained that we would  reduce his hip here at the hospital and then make arrangements to have  him go to [facility] for possible revision.   DESCRIPTION OF PROCEDURE:  The patient was seen in the holding area.  The right hip was identified as the correct surgical site.  He placed a  mark on the hip.  I placed a mark on the hip.  He had internal rotation,  pain and shortening of  the right lower leg.   The patient was brought to the operating room, given general anesthetic.  He was completely anesthetized.  Using a modified Ledbetter technique,  closed reduction was carried out.  Leg lengths appeared to be normal and  x-ray showed the hip was relocated.  An abduction pillow was placed  between the knees.  Will contact [facility] to see about a  possible transfer.           ______________________________  J. Darreld Mclean, M.D.     JWK/MEDQ  D:  08/31/2006  T:  09/01/2006  Job:  [id number]

## 2010-06-03 NOTE — Op Note (Signed)
[name], BLOWE                 ACCOUNT NO.:  [id number]   MEDICAL RECORD NO.:  [id number]          PATIENT TYPE:  AMB   LOCATION:  DSC                          FACILITY:  [facility]   PHYSICIAN:  Christopher E. Ezzard Standing, M.D.DATE OF BIRTH:  September 22, 1965   DATE OF PROCEDURE:  06/18/2006  DATE OF DISCHARGE:                               OPERATIVE REPORT   PREOPERATIVE DIAGNOSES:  Multiple facial sebaceous cysts.  Sebaceous  cyst #1 behind right ear, sebaceous cyst #2 right neck,  sebaceous cyst  #3 left ear lobe.   OPERATIONS:  Excision of sebaceous cyst x3 left ear lobe 1.5 cm, right  neck 2 cm, left postauricular sebaceous cyst 2-1/2 cm.   SURGEON:  Dr. Narda Bonds.   ANESTHESIA:  Local Xylocaine with epinephrine.   COMPLICATIONS:  None.   BRIEF CLINICAL NOTE:  [name] is a 45 year old gentleman whose had  multiple sebaceous cysts in the past. He has had persistent sebaceous  cyst in the right neck behind the right ear and in the left ear lobe  that have intermittently been tender.  Presently not infected.  He is  taken to the operating room at this time for excision under local  anesthesia.   DESCRIPTION OF PROCEDURE:  The sebaceous cysts were injected with  Xylocaine with epinephrine for local anesthetic.  The area was then  prepped with Betadine solution.  The right neck sebaceous cyst was  elliptically excised with the skin of the surrounding fistula.  The cyst  was then removed and a defect was closed with 4-0 chromic sutures  subcutaneously and 6-0 nylon to reapproximate the skin edges.  Bacitracin ointment and dressing was applied on.  The sebaceous cyst  behind the right ear lobe was likewise excised along with overlying skin  which was fairly thin.  The defect was closed with 4-0 chromic sutures  subcutaneously and 6-0 nylon to reapproximate the skin edges.  The left  ear lobe lesion was the last excised. This was excised from a  postauricular post ear lobe incision.  The cyst was excised and the  defect was closed with interrupted 6-0 nylon sutures.  [name] tolerated  this well and is discharged home later this morning on Keflex 5 mg  b.i.d. for 5 days. We will have him followup in my office in 6-7 days  for recheck and removal of sutures.           ______________________________  Kristine Garbe Ezzard Standing, M.D.     CEN/MEDQ  D:  06/18/2006  T:  06/18/2006  Job:  [id number]

## 2010-06-03 NOTE — Op Note (Signed)
[name], [name] NO.:  [id number]   MEDICAL RECORD NO.:  [id number]          PATIENT TYPE:  EMS   LOCATION:  ED                            [facility]:  [facility]   PHYSICIAN:  Vickki Hearing, M.D.DATE OF BIRTH:  March 10, 1965   DATE OF PROCEDURE:  06/11/2006  DATE OF DISCHARGE:                                PROCEDURE NOTE   PREPROCEDURE DIAGNOSIS:  Dislocation, right total hip prosthesis.   POSTPROCEDURE DIAGNOSIS:  Dislocation, right total hip prosthesis.   PROCEDURE:  1. Conscious sedation.  2. Closed reduction of right hip dislocation of total hip prosthesis.   ANESTHETIC USED:  Propofol and Versed, A total of 80 mg of propofol and  3 mL of Versed.   SURGEON:  Vickki Hearing, M.D.   ASSISTANT:  Nursing staff.   DESCRIPTION OF PROCEDURE:  After conscious sedation was obtained, the  hip was flexed, slightly internally rotated, traction was pulled and the  hip reduced.  After reduction, leg lengths showed a slight, but  nonclinically significant amount of shortening of the right leg compared  to the left.  Post reduction radiographs showed the hip prosthesis to be  in place.   ADDENDUM:  The patient was observed until he recovered from conscious  sedation and was in stable condition.      Vickki Hearing, M.D.  Electronically Signed     SEH/MEDQ  D:  06/11/2006  T:  06/11/2006  Job:  [id number]

## 2010-06-03 NOTE — Discharge Summary (Signed)
NAMECHRISTOPHR, [name]                 ACCOUNT NO.:  [id number]   MEDICAL RECORD NO.:  [id number]          PATIENT TYPE:  EMS   LOCATION:  ED                            [facility]   PHYSICIAN:  J. Darreld Mclean, M.D. 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   PROCEDURE PERFORMED:  Reduction of right total hip prosthesis, posterior  dislocation under anesthesia.   DISCHARGE STATUS:  Improved.   PROGNOSIS:  Guarded.   DISPOSITION:  Transfer to [facility].   The patient is a 45 year old male who was turning over in bed early this  morning and dislocated his right total hip posteriorly.  He was brought  by ambulance to the ER.  X-rays show the dislocation was no fracture.  The patient had a total hip done in April of this year at [facility] by Dr. Daphine Deutscher.  He dislocated his hip on May 23, reduced here  at [facility] by Dr. Romeo Apple.   The patient's hip came out of place, dislocated this past weekend August  8, and was seen at [facility], and hip was relocated.  I have  talked to his sister, since my original history and physical was done.  I found out that he was at __________  and alcohol rehabilitation unit,  and his hip came out again on Sunday, on the 10th.  He was brought back  to [facility].  The hip was relocated.  He was sent home after  that, and then last night his hip came out, so it has come out 3 times  this past week.   I was able to perform a reduction under anesthesia.  The hip was  somewhat unstable, but I was able to get it reduced.  He has an  abduction pillar in place now.  I talked to Dr. Caswell Corwin at [facility], accepted the patient for transfer.  I think the patient is  going to need a revision of his total hip prosthesis.  Again,  x-rays  demonstrate no obvious fracture.   The patient is being transferred to [facility], pending their  calling back through the [facility] line and giving [place] acceptance of bid number  the other necessary procedures under the COBRA form.                                            ______________________________  J. Darreld Mclean, M.D.     JWK/MEDQ  D:  08/31/2006  T:  08/31/2006  Job:  [id number]

## 2010-06-06 NOTE — Discharge Summary (Signed)
NAMEGOTTI, ALWIN NO.:  [id number]   MEDICAL RECORD NO.:  [id number]          PATIENT TYPE:  IPS   LOCATION:  0506                          [facility]   PHYSICIAN:  Anselm Jungling, MD  DATE OF BIRTH:  02-03-1965   DATE OF ADMISSION:  08/16/2006  DATE OF DISCHARGE:  08/20/2006                               DISCHARGE SUMMARY   IDENTIFYING DATA/REASON FOR ADMISSION:  This was a psychiatric inpatient  admission for Melville, a 45 year old divorced white male admitted for  alcohol detoxification and chronic alcohol dependence.  He had been  drinking since his teenage years.  He had never gotten sober, drinking  24-30 beers per day, the last the day prior to admission.  He had no  psychiatric history.  He had had hip and knee replacements in the  previous three years secondary to arthritis, and for this had been  prescribed Percocet.  He was also being prescribed Klonopin.  He denied  abusing either Percocet nor Klonopin.  He denied any history of seizure  in relation to alcohol use.  Please refer to the admission note for  further details pertaining to the symptoms, circumstances and history  that led to his hospitalization.   INITIAL DIAGNOSTIC IMPRESSION:  He was given initial AXIS I diagnoses of  alcohol dependence and alcohol withdrawal.   MEDICAL/LABORATORY:  The patient was medically and physically assessed  by the psychiatric nurse practitioner.  He did report significant  arthritic pain in his hip.  This was managed conservatively, without  opiate analgesics.   HOSPITAL COURSE:  The patient was admitted to the adult inpatient  service.  He presented as a slender male, well-nourished and well-  developed, alert and fully oriented, pleasant and cooperative.  There  were no signs or symptoms of psychosis, thought disorder or delirium.  His mood appeared neutral, and affect was appropriate.  He was  nontremulous on Librium.  He verbalized a strong desire  for help.   He was involved in the therapeutic milieu and attended various groups  and activities including those geared towards 12-step recovery.  He was  placed on a Librium detoxification protocol.  His mood remained  satisfactory, and his detoxification was uneventful.  He worked with the  casemanager towards securing outpatient treatment for alcohol  dependence.   On the fourth hospital day, he agreed to the following aftercare plan.   AFTERCARE:  The patient was to [facility]  with an appointment on August 23, 2006.   DISCHARGE MEDICATIONS:  None.   DISCHARGE DIAGNOSES:  AXIS I:  Alcohol dependence, early remission.  AXIS II:  Deferred.  AXIS III:  No acute or chronic illnesses.  AXIS IV:  Stressors:  Severe.  AXIS V:  GAF on discharge 50.      Anselm Jungling, MD  Electronically Signed     SPB/MEDQ  D:  09/08/2006  T:  09/08/2006  Job:  [id number]

## 2010-06-06 NOTE — H&P (Signed)
NAME:  [name], [name]                           ACCOUNT NO.:  [id number]   MEDICAL RECORD NO.:  [id number]                   PATIENT TYPE:  AMB   LOCATION:  DAY                                  [facility]   PHYSICIAN:  J. Darreld Mclean, M.D.              DATE OF BIRTH:  [birth year]   DATE OF ADMISSION:  DATE OF DISCHARGE:                                HISTORY & PHYSICAL   The patient set for surgery at [facility] tomorrow, Tuesday.   CHIEF COMPLAINT:  My knee hurts.   HISTORY:  The patient has had pain and tenderness in his knee on the left  for several weeks.  He was seen by Dr. Wende Crease at [facility] on December 8. He complained of subsequent pain and tenderness  to his left knee and some instability. Dr. Wende Crease gave him Relafen, a knee  immobilizer, Ultracet, and obtained an MRI of the knee.  Initial x-rays were  negative.  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  He had a torn lateral meniscus with absent anterior  horn and body with flip fragment.  There were 2 possible calcified loose  bodies of the anterior aspect of the lateral compartment.  The possibility  of a small tear of the lateral co-ligament also present distally. The  patient was informed of the findings by Dr. Wende Crease and asked to see me. I  saw him on December 22.  The patient was then scheduled for surgery.  Per  his request, we would wait until after the holiday.   PAST MEDICAL HISTORY:  Past history is significant that he had knee surgery,  on the left knee in [place] in 1980.  I did surgery on his right knee  in 1993 or 1994 at [facility].   Past history otherwise negative.  Denies heart disease, lung disease, kidney  disease, __________, hypertension, diabetes, TB, rheumatic fever or cancer,  polio, ulcer disease or circulatory  problems.   ALLERGIES:  He denies any allergies.   MEDICATIONS:  Currently he is on Vicodin ES for pain.   SOCIAL AND FAMILY HISTORY:  He smokes, does not use alcoholic beverages.  He  is a Engineer, maintenance (IT). Hypertension and diabetes runs in the family.  The  patient lives in [place].   PHYSICAL EXAMINATION:  VITAL SIGNS:  BP is 142/84, pulse 70, respirations  16, afebrile.  Height 6 feet 1-1/2 inches.  Weight 205.  GENERAL:  The patient alert, cooperative and oriented.  HEENT:  Negative.  NECK:  Supple.  LUNGS:  Clear to P&A.  HEART:  Regular without murmur heard.  ABDOMEN:  Soft and nontender without masses.  EXTREMITIES:  Within normal limits.  Left knee is painful and tenderness.  He has an effusion.  I cannot appreciate a loose body by clinical exam.  Range of motion of the left knee is painful and tender.  NEUROLOGIC:  CNS intact.  SKIN:  Intact,   IMPRESSION:  Tear of the lateral meniscus of the left knee.   PLAN:  Arthroscopy of the left knee. The patient is scheduled for surgery on  December 28.  The risks and imponderables of the procedure have been  discussed with him.  He has undergone arthroscopy on the right knee and is  somewhat familiar with the procedure.  Labs are pending.     ___________________________________________                                         Teola Bradley, M.D.   JWK/MEDQ  D:  01/15/2003  T:  01/15/2003  Job:  [id number]

## 2010-06-06 NOTE — Discharge Summary (Signed)
[name], LENKER NO.:  [id number]   MEDICAL RECORD NO.:  [id number]          PATIENT TYPE:  INP   LOCATION:  2018                         FACILITY:  [facility]   PHYSICIAN:  Rowe Clack, P.A.-C. DATE OF BIRTH:  January 13, 1966   DATE OF ADMISSION:  07/08/2005  DATE OF DISCHARGE:  07/15/2005                                 DISCHARGE SUMMARY   HISTORY OF PRESENT ILLNESS:  The patient is a 45 year old Caucasian male who  complained of left-sided chest pain that originated June 23, 2005 and  worsened with inspiration and cough.  He states that he did note fevers and  chills at that time.  His cough became progressive and productive with clear  sputum.  He also complained of a prolonged sore throat that had been present  for several months.  The patient presented to [facility] Emergency Room where  a cardiac origin was ruled out with serial EKGs being negative.  Cardiac  markers were negative with the exception of an increased troponin.  Dr. Myrtis Ser  at [facility] was involved with the patient at that time.  A chest  CT revealed a subpleural mass in the left lower lobe with two smaller  nodules on the right lower lobe.  There was also a left pleural effusion  present.  There was also a left pleural effusion present.  There was no  mediastinal adenopathy noted.  The patient underwent an ultrasound-guided  thoracentesis, which revealed 800 mL of yellow fluid.  All fluid cultures  and AFBs were negative.  Blood cultures have been negative.  The patient  underwent a fiberoptic bronchoscopy, which was also essentially negative.  The patient reaccumulated his left pleural effusion and maintained a  persistent left lower lobe infiltrate.  He continued to complain of chest  pain.  Secondary to these findings, the patient was transferred to [facility] under the care of Dr. Edwyna Shell for further evaluation and treatment.  The  patient complained of cough, shortness of breath,  fevers, chills, and reflux  symptoms.  He also complained of occasional palpitations.  He also noted a  weight loss of approximately 10 pounds in the previous month.   PAST MEDICAL HISTORY:  1.  Tobacco abuse.  2.  Degenerative arthritis.  3.  Nephrolithiasis.  4.  Anemia of unknown etiology, discovered upon admission to [facility].  5.  Left lower lobe pneumonia.  6.  Admission urine drug screen positive for cocaine and opiates.  7.  Elevated liver function tests at admission to [facility].  Also left      pleural effusion.   PAST SURGICAL HISTORY:  1.  Left knee arthroscopy and total replacement in 2006.  2.  Right knee arthroscopy.  3.  Ultrasound-guided thoracentesis, July 30, 2004.  4.  Fiberoptic bronchoscopy.   ALLERGIES:  No known drug allergies.   MEDICATIONS PRIOR TO ADMISSION AT [facility]:  None.   MEDICATIONS UPON TRANSFER:  1.  Nicotine patch 21 mg daily.  2.  Multivitamin every day.  3.  Thiamine 100 mg daily.  4.  Phenobarbital 130 mg  p.o. p.r.n.  5.  Lortab 5 mg q.4h. p.r.n.  6.  Mucinex 600 mg 2 b.i.d.  7.  Unasyn 3 grams IV every 6.  8.  Methadone 10 mg b.i.d.   FAMILY HISTORY/SOCIAL HISTORY/REVIEW OF SYMPTOMS AND PHYSICAL EXAM:  Please  see the history and physical done at the time of admission.   HOSPITAL COURSE:  The patient was admitted electively in transfer to Dr.  Scheryl Darter service.  Initial diagnosis included nonresolving left lower lobe  infiltrate with left-sided empyema.  Other diagnoses included positive  polysubstance abuse and the others as mentioned in the previous listing of  history.   The patient was admitted.  He was continued on antibiotic therapy.  On July 09, 2005, he was taken to the operating room where he underwent the  following procedure, left video-assisted thoracoscopy with drainage of  empyema, resection of the left lower lobe abscesses and decortication.  The  patient tolerated the procedure well and was taken to the  postanesthesia  care unit in stable condition.   POSTOPERATIVE HOSPITAL COURSE:  The patient has done well.  He has remained  afebrile on antibiotics.  His cultures have shown no specific growth.  All  routine lines, monitors and drainage devices have been discontinued in the  standard fashion.  His incision is healing well without signs of infection.  His laboratory values have improved.  He did initially have a leukocytes  following surgery, but his white blood cell count on July 13, 2005 is 9.7.  He remains hemodynamically stable.  His pain is adequately controlled.  He  is tolerating routine activities commensurably for level of postoperative  convalesces using routine protocols.  His chest x-ray appears to be showing  satisfactory progress post surgery.  His overall status is felt to be stable  for tentative discharge in the morning of July 15, 2005, pending morning  round reevaluation.   CONDITION ON DISCHARGE:  Stable and improving.   MEDICATIONS ON DISCHARGE:  1.  Tylox 1-2 q.4h. p.r.n. as needed for pain.  2.  Multivitamin 1 daily.  3.  Thiamine 100 mg daily.  4.  Nicotine patch 21 mg daily as needed.  5.  Mucinex 1200 mg twice daily.  6.  Niferex 150 mg daily.  7.  Avelox 400 mg daily for an additional 6 days.   FOLLOWUP:  Will include Dr. Edwyna Shell on July 21, 2005 at 10:50 a.m.  He will  also get a chest x-ray from [facility] 1 hour prior to that and  bring the chest x-ray to see Dr. Edwyna Shell.   FINAL DIAGNOSES:  Left empyema, now status post drainage and decortication  with resection of abscesses as described.  Other diagnoses include anemia,  most recent hemoglobin and hematocrit date is July 13, 2005 of 8.5 and  26.5.  Others include tobacco abuse, degenerative arthritis,  nephrolithiasis, elevated liver function tests on admission to [facility] of unknown etiology.  Note, most recent AST on July 11, 2005 is 23 and ALT is 30.  Total bilirubin also on  same date is 1.1.      Rowe Clack, P.A.-C.     Sherryll Burger  D:  07/14/2005  T:  07/14/2005  Job:  [id number]   cc:   Ines Bloomer, M.D.  [address]  [place]  [place] [address]

## 2010-06-06 NOTE — H&P (Signed)
[name], [name]                 ACCOUNT NO.:  [id number]   MEDICAL RECORD NO.:  [id number]          PATIENT TYPE:  AMB   LOCATION:  DAY                           FACILITY:  [facility]   PHYSICIAN:  J. Darreld Mclean, M.D. DATE OF BIRTH:  12-Jul-1965   DATE OF ADMISSION:  DATE OF DISCHARGE:  LH                                HISTORY & PHYSICAL   CHIEF COMPLAINT:  Right knee pain.   The patient is a 45 year old male with pain and tenderness in his right  knee.  I saw him in the office on August 9 complaining of marked pain and  tenderness in his right knee, giving way, locking.  I recommended aspiration  and I then obtained an MRI.  MRI was done on October 17 which showed an  extensive lateral meniscus tear with small joint effusions and degenerative  joint disease.  The cruciates were intact as was the medium meniscus.  I  informed him of the findings the following day.  I recommended consider  arthroscopy and he said he needs to check with his workplace and make  arrangements.  He has and he wants to go ahead and have surgery in his  knees, having giving way of the knee and locking.   The patient has significant trouble with his left knee.  He has a  significant boggy synovitis and recurring effusions of the left knee.  He  underwent a total knee arthroplasty on the left at [facility] in June  2006 by Dr. Daphine Deutscher.  I had previously done arthroscopy of the left knee in  2004.   PAST HISTORY:  The patient has history of pleurisy and he had a pleural  effusion and abscess was aspirated in June 2007.  He has problems with the  other knee as stated and he has had problems with right hip, has had  degenerative joint disease of his right hip.  He has no allergies.  He takes  Vicodin ES four times a day.  The patient smokes a pack of cigarettes per  day and uses alcoholic beverages.  The patient lives in [place].  The patient  denies any diseases surrounding the family.   PHYSICAL  EXAMINATION:  VITAL SIGNS:  Within normal limits.  GENERAL:  Alert, cooperative and oriented.  HEENT:  Negative.  NECK:  Supple.  LUNGS:  Have diffuse expiratory wheezing and otherwise clear.  HEART:  Regular rhythm without murmur heard.  ABDOMEN:  Soft, nontender, without masses.  EXTREMITIES:  Left knee has a well-healed scar.  There is some boggy  synovium present.  The right knee has an effusion, pain and tenderness,  particularly over the lateral joint line.  __________ sign is negative,  Lachman's is negative.  Other extremities negative.  CENTRAL NERVOUS SYSTEM:  Intact.  SKIN:  Intact.   IMPRESSION:  1. Tear of lateral meniscus of the right knee.  2. Status post significant degenerative joint disease of the left knee      status post total knee arthroplasty on the left.  3. History of recent pleurisy and infection  of lung that was aspirated.   PLAN:  Operative arthroscopy of the right knee with lateral meniscectomy.  I  discussed with the patient the planned procedure, risks and imponderables.  He voiced understanding.  Labs are pending.                                            ______________________________  J. Darreld Mclean, M.D.     JWK/MEDQ  D:  11/16/2005  T:  11/16/2005  Job:  [id number]

## 2010-06-06 NOTE — Op Note (Signed)
NAME:  [name], [name]                           ACCOUNT NO.:  [id number]   MEDICAL RECORD NO.:  [id number]                   PATIENT TYPE:  AMB   LOCATION:  DAY                                  FACILITY:  [facility]   PHYSICIAN:  J. Darreld Mclean, M.D.              DATE OF BIRTH:  01-30-1965   DATE OF PROCEDURE:  DATE OF DISCHARGE:                                 OPERATIVE REPORT   PREOPERATIVE DIAGNOSES:  Tear lateral meniscus left knee, significant  degenerative joint disease.   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   PROCEDURE:  Operative arthroscopy, partial lateral meniscectomy.   ANESTHESIA:  General.   TOURNIQUET TIME:  19 minutes.   SURGEON:  J. Darreld Mclean, M.D.   ASSISTANT:  Candace Cruise, P.A.   INDICATIONS FOR PROCEDURE:  The patient had pain and tenderness of the left  knee.  An MRI positive for torn lateral meniscus and significant DJD.  He  did not improve w conservative treatment. Surgery is recommended. The risks  and imponderables have been discussed. The patient understood and appeared  to agree with the patient as outlined.   FINDINGS:  The suprapatellar pouch had synovitis, and sub patella looked  basically good with grade 2 changes medially, no significant synovitis. The  medial meniscus was intact, grade 2 changes, anterior cruciate chronic tear  with the PCL that could be seen, very loose knee , laterally significant and  severe degenerative joint disease grade 4 changes with eburnated bone  exposed.  Lateral meniscus was torn in multiple places.  Eburnated bone was  present on inspection of distal femur and the proximal tibia.  Loose  fragments of the meniscus present.  No loose bodies were seen although this  was mentioned in the MRI as possible but no loose bodies were identified.  The patient's knee itself was basically unstable due to the ACL  and his  medial collateral ligament was very loose.   DESCRIPTION OF PROCEDURE:  The patient was placed supine on the operating  table and general anesthesia was given, tourniquet placed, deflated left  upper thigh. The patient's leg was then elevated without surcomventing and  the tourniquet inflated to 250 mmHg, instruments removed. Prior to doing all  of this, we reconfirmed we were doing [name]'s left knee for  arthroscopy.  The inflow cannula was inserted medially, lactated Ringer's  instilled in the knee by an infusion pump.  The arthroscope was inserted  laterally, Arthroscopic review of the knee, please see findings above.  Attention was directed laterally and the tear of the meniscus was readily  identified and the meniscal shaver was used and good smooth contour was  obtained. The patient had severe degenerative joint disease of the knee,  basically an unstable knee secondary to his ACL and MCL problems.  Good  contour was obtained in the remaining meniscus.  Pictures were taken  throughout the procedure.  Some debridement was done medially over the  significant synovitis medially.  Arthroscope reexamined the knee and no new  pathology seen. We irrigated the remaining part with lactated Ringer's.  The  wound was reapproximated using 3-0 nylon, interrupted vertical mattress  manner. Marcaine 0.25% instilled into each portal. Tourniquet deflated after  19 minutes, sterile dressing applied, bulky dressing applied, knee  immobilizer applied. The patient tolerated the procedure well and went to  recovery in good condition. A prescription for Vicodin ES given for pain.  I  will see him in the office in approximately 10 days to 2 weeks.  If any  difficulty or any problem at all he is to let me know.      ___________________________________________                                            Teola Bradley, M.D.   JWK/MEDQ  D:  01/16/2003  T:  01/16/2003  Job:  [id number]

## 2010-06-06 NOTE — Op Note (Signed)
[name], [name]                 ACCOUNT NO.:  [id number]   MEDICAL RECORD NO.:  [id number]          PATIENT TYPE:  AMB   LOCATION:  DAY                           FACILITY:  [facility]   PHYSICIAN:  J. Darreld Mclean, M.D. DATE OF BIRTH:  1965/03/03   DATE OF PROCEDURE:  11/17/2005  DATE OF DISCHARGE:                                 OPERATIVE REPORT   PREOPERATIVE DIAGNOSIS:  1. Tear lateral meniscus right knee.  2. Degenerative joint disease.   POSTOPERATIVE DIAGNOSIS:  1. Tear lateral meniscus right knee.  2. Degenerative joint disease.   PROCEDURE:  1. Operative arthroscopy of the right knee.  2. Partial lateral meniscectomy.   ANESTHESIA:  Spinal   TOURNIQUET TIME:  17 minutes no drains.   SURGEON:  J. Darreld Mclean, M.D.   INDICATIONS:  The patient is a 45 year old male with pain and tenderness in  the right knee with giving way and locking.  MRI showed tear of the lateral  meniscus right knee with some DJD.  The patient is status post total knee on  the left which was done at [facility].  He  has a significant history of arthritis in his knees.  He has a history of  boggy synovium.  He has not done well with the right knee and continues to  have pain and tenderness and surgery was recommended.  He may eventually  need an artifical knee on the right as well; and he understands this.  The  risks and imponderables were discussed preoperatively; and he appeared to  understand; and agreed to the procedure as outlined.  He has undergone  arthroscopy in the left knee in the past, and understands the procedure.  He  asked appropriate questions.   DESCRIPTION OF PROCEDURE:  The patient was seen in the holding area and the  right knee was identified as the correct surgical site.  He placed a mark on  the right knee and I placed a mark on the right knee.  He was brought back  to the operating room.  He was given a spinal anesthesia and was placed  supine on the operating room table.  A tourniquet and leg holder were placed  and deflated right upper thigh.  He was prepped and draped in the usual  manner.  We had a time out.  We identified [name] as the patient, and  the right knee as the correct surgical site.  The leg was then elevated and  wrapped circumferentially with an Esmarch bandage.  Tourniquet was inflated  in the right leg to 300 mmHg.  The Esmarch bandage was removed.   An inflow cannula was inserted medially in the right knee.  Lactated Ringers  was instilled into the knee by an infusion pump.  Arthroscope was inserted  laterally and the knee was systematically examined.   Findings--suprapatellar pouch with synovitis, moderate level.  He had some  grade 2-3 changes around the patella.  Medially the meniscus looked normal  and the articular surfaces looked normal.  There were some slight grade  2  changes, but otherwise negative.  The anterior cruciate was intact,  laterally, with significant change.  He had grade 4 changes here with  eburnated bone, and total loss of articular cartilage.  There was a  significant posterior horn, stellate tear of the lateral meniscus.  The  popliteus tendon was easily identified.  There were small loose fragments of  meniscus in this area.   Using the meniscal shaver, I switched from a 4.2 to a 5.6; and the meniscus  was removed.  We got a very good smooth contour.  Pertinent pictures were  taken.  As stated, the patient has eburnated bone.  The knee was then  systematically reexamined and no new pathology found.  The wound was  irrigated with the remaining part of lactated Ringers.  The wound was  reapproximated using 3-0 Nylon in an interrupted vertical mattress manner.  Marcaine 0.25% was instilled in each portal.  Tourniquet was deflated after  17 minutes.  Sterile dressing applied.  Bulky dressing applied.  Knee  immobilizer applier.  Prescription for Vicodin ES given for pain.   I will  see him in the office in approximately 10 days to 2 weeks.  Physical therapy  has been arranged.  If any difficulties he is to contact me through the  office hospital beeper system.           ______________________________  Shela Commons. Darreld Mclean, M.D.     JWK/MEDQ  D:  11/17/2005  T:  11/17/2005  Job:  [id number]

## 2010-06-06 NOTE — H&P (Signed)
[name], [name]   MEDICAL RECORD NO.:  [id number]           PATIENT TYPE:   LOCATION:                                 FACILITY:   PHYSICIAN:  Ines Bloomer, M.D. DATE OF BIRTH:  1965-07-03   DATE OF ADMISSION:  07/08/2005  DATE OF DISCHARGE:                                HISTORY & PHYSICAL   CARDIOLOGIST:  Willa Rough, M.D.   ADMISSION DIAGNOSIS:  Left empyema.   HISTORY OF PRESENT ILLNESS:  This is a 45 year old Caucasian male who  complained of left sided chest pain that originated June 23, 2005, and  worsened with inspiration and cough.  He states that he did note fever and  chills at that time.  His cough became progressive and productive with a  clear sputum.  He also complained of a prolonged sore throat that had been  present for several months.  The patient then presented to [facility]  emergency room where a cardiac origin was ruled out with serial EKGs being  negative.  Cardiac markers were negative with the exception of an increase  in troponin.  Dr. Myrtis Ser was involved with the patient's care at that time.  A  chest CT revealed a subpleural mass in the left lower lobe with 2 smaller  nodules in the right lower lobe.  There was also a left pleural effusion  present.  There was no mediastinal adenopathy noted.  The patient underwent  an ultrasound guided thoracentesis which returned 800 cc of yellow fluid.  All fluid cultures and AFBs were negative.  Blood cultures have been  negative.  The patient then underwent a fiberoptic bronchoscopy which was  also essentially negative.  The patient has re-accumulated the left pleural  effusion and has maintained a persistent left lower lobe infiltrate.  He  continues to complain of chest pain.  Secondary to these findings, the  patient was transferred to [facility] under the care of Dr. Edwyna Shell  for further evaluation and treatment.  The patient does complain of  productive  cough, shortness of breath, fever, chills, and reflux symptoms.  He has also complained of occasional palpitations.  He also notes a weight  loss of approximately 10 pounds over the previous month.  The patient does  state, however, that during the summer time he looses a small amount of  weight.   PAST MEDICAL HISTORY:  1.  Tobacco abuse.  2.  Degenerative arthritis.  3.  Nephrolithiasis.  4.  Anemia of unknown etiology, discovered upon admission to [facility].  5.  Left lower lobe pneumonia.  6.  Admission urine drug screen positive for cocaine as well as opiates.  7.  Elevated LFTs on admission to [facility].  8.  Left pleural effusion.   PAST SURGICAL HISTORY:  1.  Left knee arthroscopy and total replacement in 2006.  2.  Right knee arthroscopy.  3.  Ultrasound guided thoracentesis, June 30, 2005.  4.  Fiberoptic bronchoscopy.   ALLERGIES:  No known drug allergies.   MEDICATIONS:  None prior to admission to [facility].  HOSPITAL MEDICATIONS:  Upon transfer:  1.  Nicotine patch 21 mg transdermal every day.  2.  Multivitamin every day.  3.  Thiamine 100 mg p.o. every day.  4.  Phenobarbital 130 mg p.o. p.r.n.  5.  Lortab 5 mg p.o. q.4 h. p.r.n.  6.  __________  50 mg IV q.12 h.  7.  Mucinex 600 mg 2 b.i.d.  8.  Unasyn 3 grams IV q.6 h.  9.  Methadone 10 mg p.o. b.i.d.   REVIEW OF SYSTEMS:  Please see HPI for significant positives and negatives.  Otherwise negative for cardiac disease, renal disease, and diabetes  mellitus.   SOCIAL HISTORY:  This is a single male with 1 child who lives with his son  and parents.  He does continue to smoke 1 pack per day and has done so for  20 years.  The patient does admit to drinking a 6-pack of beer per day.  He  is currently unemployed and does drive.   FAMILY HISTORY:  His mother is alive and had a brain tumor resected several  years ago.  This was benign.  His father is alive and has a history of CVA  as well as diabetes  mellitus.   PHYSICAL EXAMINATION:  VITAL SIGNS:  Blood pressure 145/74, heart rate 74,  respirations 18, temperature 98.3.  GENERAL:  This is a 45 year old Caucasian male in no acute distress.  HEENT:  Normocephalic atraumatic.  Pupils are equal, round, and reactive to  light and accommodation.  Extraocular movements are intact.  Oral mucosa is  pink and moist.  Sclerae are nonicteric.  NECK:  Supple with no JVD.  No bruits.  No lymphadenopathy.  His carotids  are palpable.  CHEST:  Respirations are not symmetric.  They are unlabored.  They are clear  on the right and diminished on the left.  There are no wheezes or rhonchi  auscultated.  CARDIAC:  Regular rate and rhythm.  No murmurs.  No rubs.  No gallops.  ABDOMEN:  Soft, nontender, nondistended with normoactive bowel sounds.  Flat.  There is no hepatosplenomegaly noted.  GU/RECTAL:  Deferred.  EXTREMITIES:  No edema, varicosities, or venous stasis changes.  There is a  well healed scar present over the left knee.  PULSES:  Radial, femoral, popliteal, and pedal pulses are 2+ bilaterally.  Temperature is warm.  NEUROLOGIC:  Nonfocal.  The patient is alert and oriented x3.  His gait is  steady.  Muscle strength is 5/5 throughout all extremities and symmetric.  Deep tendon reflexes are 2+.   ASSESSMENT:  1.  Non-resolving left lower lobe infiltrate with left side empyema.  2.  Poly substance abuse, positive for cocaine, alcohol, and opiates on      admission to [facility].   PLAN:  1.  Admit to Dr. Edwyna Shell.  2.  Continue antibiotic therapy.  3.  Dr. Edwyna Shell will evaluate for possible __________  .      Pecola Leisure, PA    ______________________________  Ines Bloomer, M.D.    AY/MEDQ  D:  07/08/2005  T:  07/08/2005  Job:  [id number]   cc:   Ines Bloomer, M.D.  [address]  [place]  [place] [address]

## 2010-06-06 NOTE — Op Note (Signed)
[name], PLOCH NO.:  [id number]   MEDICAL RECORD NO.:  [id number]          PATIENT TYPE:  INP   LOCATION:  5735                         FACILITY:  [facility]   PHYSICIAN:  Ines Bloomer, M.D. DATE OF BIRTH:  Nov 09, 1965   DATE OF PROCEDURE:  07/09/2005  DATE OF DISCHARGE:                                 OPERATIVE REPORT   PREOPERATIVE DIAGNOSIS:  Left lower lobe left chest empyema.   POSTOPERATIVE DIAGNOSIS:  Abscess of the left lower lobe with empyema.   OPERATION PERFORMED:  Left VATS, drainage of empyema, resection of left  lower lobe abscesses and decortication.   SURGEON:  Ines Bloomer, MD.   FIRST ASSISTANT:  Pecola Leisure, PA-C.   ANESTHESIA:  General endotracheal.   This 45 year old patient was transferred from [facility] where he had  been for two weeks with IV antibiotics. He presented with left lower lobe  pneumonia. CT scan showed an irregular 2 cm pleural type lesion in the left  lower lobe. He developed a parapneumonic effusion with loculations and loss  of his left lower lobe with collapse of the left lower lobe. He was  transferred here for decortication. After general anesthesia, he was turned  to the left lateral thoracotomy position and was prepped and draped in the  usual sterile manner. Two trocar sites were made in the anterior and mid  axillary line at the seventh intercostal space. Two trocars were inserted.  The effusion was evacuated. There was a lot of fluid there and the left  lower lobe was stuck to the diaphragm and medially to the pericardium and  the left lower lobe was stuck to the pleura but is was not as loculated as  we originally thought. So using Keizer ring forceps, the left upper lobe was  freed up as well as the superior segment of the left lower lobe. There was a  lot of irregularity in the pleura and a pleural biopsy was done to show that  this was inflammatory. The inflamed pleura was resected and it  was decided  to do a mini thoracotomy at the sixth intercostal space. The latissimus was  partially divided, the serratus was flipped, the small tractor was inserted  and with this we were able to free up the left lower lobe off the diaphragm  and then off the pericardium. After the freeing it up and palpating, we  could palpate a 2-3 cm lesion in the left basilar segments of the left lower  lobe. __________ abscess. We resected this with the EZ-45 stapler first  resecting laterally and then medially with multiple applications of the  green stapler. Frozen section revealed an organized pneumonia with multiple  microabscesses. CoSeal was applied to the staple line. An On-Q catheter was  placed in the usual fashion and a Marcaine block done in the usual fashion.  Three chest tubes were placed after freeing up the lung and removing all the  pleura. The lung reexpanded well to fill the space. An anterior straight 28  chest tube, a posterior straight 28 chest tube and  a metal right angle 28  chest tube. These were sutured in place with #0 silk. Two drill holes were  then placed through the seventh rib and the pericostal's were  placed through the drill holes and then around the superior portion of the  sixth rib. The muscle area was closed with #1 Vicryl, subcutaneous tissue 2-  0 Vicryl and 3-0 Vicryl subcuticular stitch. The patient was returned to the  recovery room in serious condition.           ______________________________  Ines Bloomer, M.D.     DPB/MEDQ  D:  07/09/2005  T:  07/09/2005  Job:  [id number]

## 2010-07-09 ENCOUNTER — Emergency Department (HOSPITAL_COMMUNITY)
Admission: EM | Admit: 2010-07-09 | Discharge: 2010-07-09 | Disposition: A | Discharge status: Home / Self Care | Payer: Medicare Other | Attending: Emergency Medicine | Admitting: Emergency Medicine

## 2010-07-09 ENCOUNTER — Emergency Department (HOSPITAL_COMMUNITY): Payer: Medicare Other

## 2010-07-09 DIAGNOSIS — W010XXA Fall on same level from slipping, tripping and stumbling without subsequent striking against object, initial encounter: Secondary | ICD-10-CM | POA: Insufficient documentation

## 2010-07-09 DIAGNOSIS — Y92009 Unspecified place in unspecified non-institutional (private) residence as the place of occurrence of the external cause: Secondary | ICD-10-CM | POA: Insufficient documentation

## 2010-07-09 DIAGNOSIS — T07XXXA Unspecified multiple injuries, initial encounter: Secondary | ICD-10-CM | POA: Insufficient documentation

## 2010-11-03 LAB — BASIC METABOLIC PANEL
CO2: 22
Calcium: 9
Chloride: 109
Creatinine, Ser: 0.59
GFR calc Af Amer: >60
GFR calc Af Amer: >60
GFR calc non Af Amer: >60
GFR calc non Af Amer: >60
Glucose, Bld: 129 — ABNORMAL HIGH
Potassium: 4
Sodium: 141
Sodium: 138

## 2010-11-03 LAB — RAPID URINE DRUG SCREEN, HOSP PERFORMED
Amphetamines: NOT DETECTED
Benzodiazepines: NOT DETECTED
Cocaine: NOT DETECTED
Tetrahydrocannabinol: NOT DETECTED
Tetrahydrocannabinol: NOT DETECTED

## 2010-11-03 LAB — CBC
HCT: 36 — ABNORMAL LOW
Hemoglobin: 12 — ABNORMAL LOW
Hemoglobin: 15.1
MCHC: 33.5
MCV: 88.4
RBC: 4.08 — ABNORMAL LOW
RDW: 16.3 — ABNORMAL HIGH
WBC: 7.3

## 2010-11-03 LAB — DIFFERENTIAL
Basophils Absolute: 0
Basophils Relative: 0
Eosinophils Absolute: 0.4
Eosinophils Relative: 6 — ABNORMAL HIGH
Lymphocytes Relative: 19
Lymphocytes Relative: 26
Lymphs Abs: 1.4
Monocytes Absolute: 0.5
Monocytes Relative: 11
Neutro Abs: 4.7
Neutrophils Relative %: 67

## 2010-11-03 LAB — HEPATIC FUNCTION PANEL
ALT: 33
AST: 64 — ABNORMAL HIGH
Albumin: 3.6
Total Protein: 6.7

## 2010-11-03 LAB — URINALYSIS, ROUTINE W REFLEX MICROSCOPIC
Ketones, ur: NEGATIVE
Nitrite: NEGATIVE
Protein, ur: NEGATIVE
Urobilinogen, UA: 0.2
pH: 6

## 2010-11-03 LAB — ETHANOL: Alcohol, Ethyl (B): <5

## 2010-11-05 LAB — RAPID URINE DRUG SCREEN, HOSP PERFORMED
Amphetamines: NOT DETECTED
Benzodiazepines: NOT DETECTED
Cocaine: POSITIVE — AB
Opiates: NOT DETECTED
Tetrahydrocannabinol: NOT DETECTED

## 2010-11-05 LAB — BASIC METABOLIC PANEL
BUN: 1 — ABNORMAL LOW
Calcium: 8.5
Creatinine, Ser: 0.73
GFR calc non Af Amer: >60
Glucose, Bld: 118 — ABNORMAL HIGH
Potassium: 3.6

## 2010-11-05 LAB — ETHANOL: Alcohol, Ethyl (B): 273 — ABNORMAL HIGH

## 2010-11-05 LAB — PROTIME-INR
INR: 1
Prothrombin Time: 13.1

## 2010-11-05 LAB — CBC
HCT: 42.2
Platelets: 105 — ABNORMAL LOW
RDW: 15.9 — ABNORMAL HIGH
WBC: 7.2

## 2010-11-05 LAB — DIFFERENTIAL
Basophils Absolute: 0.1
Eosinophils Relative: 1
Lymphocytes Relative: 43
Neutrophils Relative %: 48

## 2011-02-07 ENCOUNTER — Encounter (HOSPITAL_COMMUNITY): Payer: Self-pay

## 2011-02-07 ENCOUNTER — Emergency Department (HOSPITAL_COMMUNITY): Payer: Medicare Other

## 2011-02-07 ENCOUNTER — Emergency Department (HOSPITAL_COMMUNITY)
Admission: EM | Admit: 2011-02-07 | Discharge: 2011-02-08 | Disposition: A | Discharge status: Other Acute Inpatient Hospital | Payer: Medicare Other | Source: Home / Self Care | Attending: Emergency Medicine | Admitting: Emergency Medicine

## 2011-02-07 DIAGNOSIS — F1020 Alcohol dependence, uncomplicated: Secondary | ICD-10-CM | POA: Insufficient documentation

## 2011-02-07 DIAGNOSIS — F1120 Opioid dependence, uncomplicated: Secondary | ICD-10-CM | POA: Insufficient documentation

## 2011-02-07 DIAGNOSIS — F1010 Alcohol abuse, uncomplicated: Secondary | ICD-10-CM

## 2011-02-07 DIAGNOSIS — F17200 Nicotine dependence, unspecified, uncomplicated: Secondary | ICD-10-CM | POA: Insufficient documentation

## 2011-02-07 DIAGNOSIS — R51 Headache: Secondary | ICD-10-CM | POA: Insufficient documentation

## 2011-02-07 DIAGNOSIS — Z96649 Presence of unspecified artificial hip joint: Secondary | ICD-10-CM | POA: Insufficient documentation

## 2011-02-07 DIAGNOSIS — M542 Cervicalgia: Secondary | ICD-10-CM | POA: Insufficient documentation

## 2011-02-07 DIAGNOSIS — Z96659 Presence of unspecified artificial knee joint: Secondary | ICD-10-CM | POA: Insufficient documentation

## 2011-02-07 LAB — BASIC METABOLIC PANEL
BUN: 5 mg/dL — ABNORMAL LOW (ref 6–23)
CO2: 24 mEq/L (ref 19–32)
Calcium: 9 mg/dL (ref 8.4–10.5)
Creatinine, Ser: 0.51 mg/dL (ref 0.50–1.35)
Glucose, Bld: 95 mg/dL (ref 70–99)

## 2011-02-07 LAB — DIFFERENTIAL
Eosinophils Relative: 1 % (ref 0–5)
Lymphocytes Relative: 28 % (ref 12–46)
Lymphs Abs: 1.8 10*3/uL (ref 0.7–4.0)
Monocytes Absolute: 0.4 10*3/uL (ref 0.1–1.0)

## 2011-02-07 LAB — URINALYSIS, ROUTINE W REFLEX MICROSCOPIC
Bilirubin Urine: NEGATIVE
Glucose, UA: NEGATIVE mg/dL
Leukocytes, UA: NEGATIVE
pH: 6 (ref 5.0–8.0)

## 2011-02-07 LAB — CBC
HCT: 46.4 % (ref 39.0–52.0)
MCH: 29.6 pg (ref 26.0–34.0)
MCV: 86.9 fL (ref 78.0–100.0)
RBC: 5.34 MIL/uL (ref 4.22–5.81)
WBC: 6.3 10*3/uL (ref 4.0–10.5)

## 2011-02-07 LAB — RAPID URINE DRUG SCREEN, HOSP PERFORMED
Amphetamines: NOT DETECTED
Benzodiazepines: NOT DETECTED
Tetrahydrocannabinol: NOT DETECTED

## 2011-02-07 LAB — URINE MICROSCOPIC-ADD ON

## 2011-02-07 MED ORDER — LORAZEPAM 1 MG PO TABS
1.0000 mg | ORAL_TABLET | Freq: Once | ORAL | Status: AC
  Administered 2011-02-07: 1 mg via ORAL
  Filled 2011-02-07: qty 1

## 2011-02-07 MED ORDER — SODIUM CHLORIDE 0.9 % IV SOLN: Freq: Once | INTRAVENOUS | Status: DC

## 2011-02-07 MED ORDER — NICOTINE 14 MG/24HR TD PT24
14.0000 mg | MEDICATED_PATCH | Freq: Once | TRANSDERMAL | Status: DC
  Administered 2011-02-07: 14 mg via TRANSDERMAL
  Filled 2011-02-07: qty 1

## 2011-02-07 NOTE — ED Notes (Signed)
Pt in restroom 

## 2011-02-07 NOTE — BH Assessment (Signed)
Assessment Note   [name] is an 46 y.o. male. He presents in the Emergency Department requesting alcohol and opioid Detox. He reports he drinks at least 15 beers a day and is using approximately 20 hydrocodone's, 10/550 per day. He states he does have a valid prescription for the 7.5 hydrocodone's from Dr. Olena Leatherwood, but he has been getting the 10's from somewhere else. He states when he left the [facility], he was able to stay clean about 3 1/2 years. He did not do aftercare or an AA program; just "cold [place]" after the detox. He has a history of withdrawals and DTs. He has had a couple of seizures from withdrawing, but that was many, many years ago (over 5 yrs ago per patient). He states the event which brought him to the ED was a DWI which he received 3 days ago. The Officer also found the hydrocodone pills (3) in his possession, so he is also charged with schedule II possession of a narcotic. He has had two previous DWIs, 18 years ago. He reports that he does not want rehab or a half way house placement. He smokes over 1ppd; and is asking for a nicotine patch now. He is pleasant, but he is definitely under the influence. He has been advised he will receive no pain pills at all..he understands this and is still willing to proceed with the Detox protocol and be referred for treatment. He will be a voluntary admission and will be transported by [facility].   Axis I: Alcohol Abuse (dependence); Opioid Dependence Axis II: Deferred Axis III Hip replacement, Knee Replacement, Back issues, Past hx of stroke during hip replacement-no LT effects noted Axis IV: Legal charges pending, financial issues Axis V GAF 56  Past Medical History: History reviewed. No pertinent past medical history.  Past Surgical History  Procedure Date  . Total hip arthroplasty   . Total knee arthroplasty     Family History: No family history on file.  Social History:  reports that he has been smoking.  He does  not have any smokeless tobacco history on file. He reports that he drinks alcohol. He reports that he uses illicit drugs (Marijuana).  Additional Social History:    Allergies: No Known Allergies  Home Medications:  Medications Prior to Admission  Medication Dose Route Frequency Provider Last Rate Last Dose  . LORazepam (ATIVAN) tablet 1 mg  1 mg Oral Once Benny Lennert, MD   1 mg at 02/07/11 1812  . nicotine (NICODERM CQ - dosed in mg/24 hours) patch 14 mg  14 mg Transdermal Once Benny Lennert, MD   14 mg at 02/07/11 1812   No current outpatient prescriptions on file as of 02/07/2011.    OB/GYN Status:  No LMP for male patient.  General Assessment Data Location of Assessment: [facility] ACT Assessment: Yes Living Arrangements: Non-Relatives (Girl Friend) Can pt return to current living arrangement?: Yes Admission Status: Voluntary Is patient capable of signing voluntary admission?: Yes Transfer from: Acute Hospital Referral Source: MD  Education Status Is patient currently in school?: No Contact person: Tammy Cordova  Risk to self Suicidal Ideation: No Suicidal Intent: No Is patient at risk for suicide?: No Suicidal Plan?: No Access to Means: No What has been your use of drugs/alcohol within the last 12 months?: Daily-15 beers, 20 oxycodones Previous Attempts/Gestures: No Intentional Self Injurious Behavior: None Family Suicide History: No Recent stressful life event(s): Financial Problems;Legal Issues;Conflict (Comment) (DWI 3 days  ago) Persecutory voices/beliefs?: No Depression: No Substance abuse history and/or treatment for substance abuse?: Yes Suicide prevention information given to non-admitted patients: Not applicable  Risk to Others Homicidal Ideation: No Thoughts of Harm to Others: No Current Homicidal Intent: No Current Homicidal Plan: No Access to Homicidal Means: No History of harm to others?: No Assessment of Violence: None Noted Does patient have  access to weapons?: No Criminal Charges Pending?: Yes Describe Pending Criminal Charges: DWI, Possession of Schedule II Does patient have a court date: Yes Court Date: 03/10/11  Psychosis Hallucinations: None noted Delusions: None noted  Mental Status Report Appear/Hygiene: Disheveled Eye Contact: Fair Motor Activity: Freedom of movement;Restlessness;Tremors Speech: Logical/coherent;Pressured Level of Consciousness: Alert Mood: Anxious;Apprehensive;Irritable Affect: Anxious;Irritable Anxiety Level: Minimal Thought Processes: Relevant Judgement: Unimpaired Orientation: Person;Place;Time;Situation Obsessive Compulsive Thoughts/Behaviors: None  Cognitive Functioning Concentration: Normal Memory: Recent Intact IQ: Average Insight: Fair Impulse Control: Poor Appetite: Fair Weight Loss: 0  Weight Gain: 0  Sleep: Decreased Total Hours of Sleep: 3  Vegetative Symptoms: None  Prior Inpatient Therapy Prior Inpatient Therapy: Yes Prior Therapy Dates: 2008, 2007 Prior Therapy Facilty/Provider(s): [facility] [place] [facility] Reason for Treatment: Detox; SA tx  Prior Outpatient Therapy Prior Outpatient Therapy: No            Values / Beliefs Cultural Requests During Hospitalization: None Spiritual Requests During Hospitalization: None        Additional Information 1:1 In Past 12 Months?: No CIRT Risk: No Elopement Risk: No Does patient have medical clearance?: Yes     Disposition:  Disposition Disposition of Patient: Inpatient treatment program;Referred to [facility]) Type of inpatient treatment program: Adult Patient referred to:  [facility])  On Site Evaluation by:  Dr. Aileen Pilot Reviewed with Physician:  Dr. Robley Fries, Maximiano Coss H 02/07/2011 6:14 PM

## 2011-02-07 NOTE — ED Notes (Signed)
Pt back from ct

## 2011-02-07 NOTE — ED Notes (Signed)
[facility] called & updated on pt etoh level. Was advised they would give information to the md for reavaluation.

## 2011-02-07 NOTE — ED Notes (Signed)
Pt placed on monitor.  
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
## 2011-02-07 NOTE — ED Notes (Signed)
Pt given water to drink at this time. Pt advised another blood draw to be done. NAD noted. No other needs voiced.

## 2011-02-07 NOTE — ED Notes (Signed)
Pt presents with ETOH and Oxycodone addiction. Pt is intoxicated today and states he drank approx 15 beers. Pt states took his last Oxycodone at 0700 today.

## 2011-02-07 NOTE — ED Notes (Signed)
CRITICAL VALUE ALERT  Critical value received:  404  Date of notification:  02/07/11  Time of notification:  17:11  Critical value read back: yes  Nurse who received alert:  L. Raquel James, RN  MD notified (1st page):  Dr. Estell Harpin  Time of first page:  17:11  MD notified (2nd page):  Time of second page:  Responding MD:  Dr. Estell Harpin  Time MD responded:  17:11

## 2011-02-07 NOTE — ED Notes (Signed)
Pt given more chips by nurse tech and patient advocate.

## 2011-02-07 NOTE — ED Notes (Signed)
[name] [phone]. Call upon pt disposition.

## 2011-02-08 ENCOUNTER — Encounter (HOSPITAL_COMMUNITY): Payer: Self-pay | Admitting: *Deleted

## 2011-02-08 ENCOUNTER — Inpatient Hospital Stay (HOSPITAL_COMMUNITY)
Admission: AD | Admit: 2011-02-08 | Discharge: 2011-02-12 | DRG: 897 | Disposition: A | Discharge status: Other Acute Inpatient Hospital | Payer: Medicare Other | Source: Ambulatory Visit | Attending: Psychiatry | Admitting: Psychiatry

## 2011-02-08 DIAGNOSIS — Z8673 Personal history of transient ischemic attack (TIA), and cerebral infarction without residual deficits: Secondary | ICD-10-CM

## 2011-02-08 DIAGNOSIS — F1120 Opioid dependence, uncomplicated: Secondary | ICD-10-CM

## 2011-02-08 DIAGNOSIS — Z96649 Presence of unspecified artificial hip joint: Secondary | ICD-10-CM

## 2011-02-08 DIAGNOSIS — E11628 Type 2 diabetes mellitus with other skin complications: Secondary | ICD-10-CM

## 2011-02-08 DIAGNOSIS — L97509 Non-pressure chronic ulcer of other part of unspecified foot with unspecified severity: Secondary | ICD-10-CM

## 2011-02-08 DIAGNOSIS — R45851 Suicidal ideations: Secondary | ICD-10-CM

## 2011-02-08 DIAGNOSIS — F1020 Alcohol dependence, uncomplicated: Principal | ICD-10-CM

## 2011-02-08 DIAGNOSIS — R9389 Abnormal findings on diagnostic imaging of other specified body structures: Secondary | ICD-10-CM

## 2011-02-08 DIAGNOSIS — Z96659 Presence of unspecified artificial knee joint: Secondary | ICD-10-CM

## 2011-02-08 DIAGNOSIS — E1169 Type 2 diabetes mellitus with other specified complication: Secondary | ICD-10-CM

## 2011-02-08 MED ORDER — ACETAMINOPHEN 325 MG PO TABS
650.0000 mg | ORAL_TABLET | Freq: Four times a day (QID) | ORAL | Status: DC | PRN
  Administered 2011-02-11: 650 mg via ORAL

## 2011-02-08 MED ORDER — ZOLPIDEM TARTRATE 5 MG PO TABS: 5.0000 mg | ORAL_TABLET | Freq: Every evening | ORAL | Status: DC | PRN

## 2011-02-08 MED ORDER — METHOCARBAMOL 500 MG PO TABS
500.0000 mg | ORAL_TABLET | Freq: Three times a day (TID) | ORAL | Status: DC | PRN
  Administered 2011-02-08: 500 mg via ORAL
  Filled 2011-02-08: qty 1

## 2011-02-08 MED ORDER — VITAMIN B-1 100 MG PO TABS
100.0000 mg | ORAL_TABLET | Freq: Every day | ORAL | Status: DC
  Administered 2011-02-09 – 2011-02-12 (×4): 100 mg via ORAL
  Filled 2011-02-08 (×5): qty 1

## 2011-02-08 MED ORDER — ONDANSETRON 4 MG PO TBDP: 4.0000 mg | ORAL_TABLET | Freq: Four times a day (QID) | ORAL | Status: DC | PRN

## 2011-02-08 MED ORDER — HYDROXYZINE HCL 25 MG PO TABS: 25.0000 mg | ORAL_TABLET | Freq: Four times a day (QID) | ORAL | Status: DC | PRN

## 2011-02-08 MED ORDER — NICOTINE 14 MG/24HR TD PT24
14.0000 mg | MEDICATED_PATCH | Freq: Once | TRANSDERMAL | Status: DC
  Administered 2011-02-08: 14 mg via TRANSDERMAL
  Filled 2011-02-08 (×2): qty 1

## 2011-02-08 MED ORDER — ONDANSETRON 4 MG PO TBDP
4.0000 mg | ORAL_TABLET | Freq: Four times a day (QID) | ORAL | Status: DC | PRN
  Administered 2011-02-08 – 2011-02-09 (×3): 4 mg via ORAL

## 2011-02-08 MED ORDER — LORAZEPAM 1 MG PO TABS
1.0000 mg | ORAL_TABLET | Freq: Three times a day (TID) | ORAL | Status: DC | PRN
  Administered 2011-02-08: 1 mg via ORAL
  Filled 2011-02-08 (×2): qty 1

## 2011-02-08 MED ORDER — LORAZEPAM 1 MG PO TABS
2.0000 mg | ORAL_TABLET | Freq: Once | ORAL | Status: AC
  Administered 2011-02-08: 2 mg via ORAL
  Filled 2011-02-08: qty 2

## 2011-02-08 MED ORDER — THIAMINE HCL 100 MG/ML IJ SOLN
100.0000 mg | Freq: Once | INTRAMUSCULAR | Status: AC
  Administered 2011-02-08: 100 mg via INTRAMUSCULAR

## 2011-02-08 MED ORDER — CHLORDIAZEPOXIDE HCL 25 MG PO CAPS
25.0000 mg | ORAL_CAPSULE | Freq: Once | ORAL | Status: AC
  Administered 2011-02-08: 25 mg via ORAL
  Filled 2011-02-08: qty 1

## 2011-02-08 MED ORDER — ALUM & MAG HYDROXIDE-SIMETH 200-200-20 MG/5ML PO SUSP: 30.0000 mL | ORAL | Status: DC | PRN

## 2011-02-08 MED ORDER — LOPERAMIDE HCL 2 MG PO CAPS: 2.0000 mg | ORAL_CAPSULE | ORAL | Status: DC | PRN

## 2011-02-08 MED ORDER — CLONIDINE HCL 0.1 MG PO TABS
0.1000 mg | ORAL_TABLET | Freq: Four times a day (QID) | ORAL | Status: DC
  Administered 2011-02-08 – 2011-02-09 (×5): 0.1 mg via ORAL
  Filled 2011-02-08 (×5): qty 1

## 2011-02-08 MED ORDER — CLONIDINE HCL 0.1 MG PO TABS
0.1000 mg | ORAL_TABLET | ORAL | Status: DC
  Filled 2011-02-08 (×2): qty 1

## 2011-02-08 MED ORDER — CLONIDINE HCL 0.1 MG PO TABS: 0.1000 mg | ORAL_TABLET | Freq: Every day | ORAL | Status: DC

## 2011-02-08 MED ORDER — ONDANSETRON HCL 4 MG PO TABS: 4.0000 mg | ORAL_TABLET | Freq: Three times a day (TID) | ORAL | Status: DC | PRN

## 2011-02-08 MED ORDER — ADULT MULTIVITAMIN W/MINERALS CH
1.0000 | ORAL_TABLET | Freq: Every day | ORAL | Status: DC
  Administered 2011-02-08 – 2011-02-09 (×2): 1 via ORAL
  Filled 2011-02-08 (×2): qty 1

## 2011-02-08 MED ORDER — HYDROXYZINE HCL 25 MG PO TABS
25.0000 mg | ORAL_TABLET | Freq: Four times a day (QID) | ORAL | Status: DC | PRN
  Administered 2011-02-08 – 2011-02-11 (×8): 25 mg via ORAL
  Filled 2011-02-08: qty 1

## 2011-02-08 MED ORDER — CARBAMAZEPINE 200 MG PO TABS
200.0000 mg | ORAL_TABLET | Freq: Two times a day (BID) | ORAL | Status: DC
  Administered 2011-02-08 – 2011-02-09 (×2): 200 mg via ORAL
  Filled 2011-02-08 (×4): qty 1

## 2011-02-08 MED ORDER — NAPROXEN 500 MG PO TABS
500.0000 mg | ORAL_TABLET | Freq: Two times a day (BID) | ORAL | Status: DC | PRN
  Administered 2011-02-08: 500 mg via ORAL
  Filled 2011-02-08: qty 1

## 2011-02-08 MED ORDER — IBUPROFEN 400 MG PO TABS: 600.0000 mg | ORAL_TABLET | Freq: Three times a day (TID) | ORAL | Status: DC | PRN

## 2011-02-08 MED ORDER — DICYCLOMINE HCL 20 MG PO TABS
20.0000 mg | ORAL_TABLET | ORAL | Status: DC | PRN
  Administered 2011-02-08 – 2011-02-09 (×4): 20 mg via ORAL
  Filled 2011-02-08 (×4): qty 1

## 2011-02-08 MED ORDER — LORAZEPAM 1 MG PO TABS
1.0000 mg | ORAL_TABLET | Freq: Once | ORAL | Status: AC
  Administered 2011-02-08: 1 mg via ORAL

## 2011-02-08 MED ORDER — MAGNESIUM HYDROXIDE 400 MG/5ML PO SUSP
30.0000 mL | Freq: Every day | ORAL | Status: DC | PRN
  Administered 2011-02-09: 30 mL via ORAL

## 2011-02-08 MED ORDER — CHLORDIAZEPOXIDE HCL 25 MG PO CAPS
25.0000 mg | ORAL_CAPSULE | Freq: Four times a day (QID) | ORAL | Status: DC | PRN
  Administered 2011-02-08 – 2011-02-09 (×4): 25 mg via ORAL
  Filled 2011-02-08 (×4): qty 1

## 2011-02-08 NOTE — Tx Team (Signed)
Initial Interdisciplinary Treatment Plan  PATIENT STRENGTHS: (choose at least two) Average or above average intelligence Motivation for treatment/growth  PATIENT STRESSORS: Health problems Substance abuse   PROBLEM LIST: Problem List/Patient Goals Date to be addressed Date deferred Reason deferred Estimated date of resolution  Substance abuse 02/08/11                                                      DISCHARGE CRITERIA:  Withdrawal symptoms are absent or subacute and managed without 24-hour nursing intervention  PRELIMINARY DISCHARGE PLAN: Return to previous living arrangement  PATIENT/FAMIILY INVOLVEMENT: This treatment plan has been presented to and reviewed with the patient, [name], and/or family member, .  The patient and family have been given the opportunity to ask questions and make suggestions.  Jacquelyne Balint Shanta 02/08/2011, 12:46 PM

## 2011-02-08 NOTE — ED Provider Notes (Signed)
10:23 Bobby, [facility] states Stephan Minister is on her way to see patient and to finish his paperwork so they can present this patient to their MD.    10:45 [facility] is here to transport patient to [facility]. But still waiting for acceptence  11:03 spoke to Tom, [facility] states he is waiting to hear from the PA on call to accept patient, hopefully shortly.   Pt alert and cooperative. No tremor noted. [facility] wanted another dose of ativan prior to transport. Pt agreeable with admission to [facility].   Ward Givens, MD 02/08/11 1137

## 2011-02-08 NOTE — ED Provider Notes (Signed)
1:36 AM Pt signed out to me by Dr. Estell Harpin, plan was for transfer to behavioral health once etoh level decrease, etoh level decreased and down to 157.  Per nursing staff now, [facility] bed has been assigned to another patient.  So, patient will likely not be transferred until tomorrow.  Psych holding orders written.    Ethelda Chick, MD 02/09/11 [phone]

## 2011-02-08 NOTE — Progress Notes (Signed)
Pt continues to experience withdrawal, specifically shaking and stomach cramping.  Pt is pleasant on approach, and requests medication for symptoms.  Pt positive for group, interacting appropriately on unit.  Pt is a new admit and came in today.  He was taking up to 20 hydrocodone daily as well as drinking ETOH.  Pt is cooperative on unit, no acute distress noted.  Support and encouragement offered.  Will continue to monitor.

## 2011-02-08 NOTE — ED Notes (Signed)
Pt reporting feeling shaky again. edp notified.

## 2011-02-08 NOTE — ED Notes (Signed)
Called [facility], spoke w/ [facility], was advised bed was given to another pt before etoh level came back. As of right now pt information has not been run by the dr due to not having a bed. EDP notified, & will pass information to the ACT team.

## 2011-02-08 NOTE — Progress Notes (Signed)
[facility] Adult Inpatient Family/Significant Other Suicide Prevention Education  Suicide Prevention Education:  Patient Refusal for Family/Significant Other Suicide Prevention Education: The patient [name] has refused to provide written consent for family/significant other to be provided Family/Significant Other Suicide Prevention Education during admission and/or prior to discharge.  Physician notified.  Pt. accepted information on suicide prevention, warning signs to look for with suicide and crisis line numbers to use. The pt. agreed to call crisis line numbers if having warning signs or having thoughts of suicide.  Aleyna Cueva 02/08/2011, 4:19 PM

## 2011-02-08 NOTE — ED Notes (Signed)
Pt states he is starting to have the shakes. EDP notified.

## 2011-02-08 NOTE — [facility] Counselor (Signed)
Adult Comprehensive Assessment  Patient ID: [name], male   DOB: 1965-03-08, 46 y.o.   MRN: [id number]  Information Source: Information source: Patient  Current Stressors:  Educational / Learning stressors: N/A Employment / Job issues: N/A Family Relationships: N/A Surveyor, quantity / Lack of resources (include bankruptcy): Pt has issues with covering the cost of his medications. Housing / Lack of housing: N/A Physical health (include injuries & life threatening diseases): Pt has arthritis and has had multiple joint replacement surgeries.  Social relationships: N/A Substance abuse: Pt abuses alcohol and his prescription medications. Bereavement / Loss: N/A  Living/Environment/Situation:  Living Arrangements: Spouse/significant other (With his girlfriend.) Living conditions (as described by patient or guardian): Pt is living in a mobile home and he reports that the conditions are "good." How long has patient lived in current situation?: Pt has been living in current home for about a year. What is atmosphere in current home: Comfortable  Family History:  Marital status: Long term relationship Long term relationship, how long?: Pt has been with current girlfriend for "a little over a year." What types of issues is patient dealing with in the relationship?: None. Additional relationship information: Pt says they only debate about finances and other common things. Does patient have children?: Yes How many children?: 1  (Son who is 24 years old.) How is patient's relationship with their children?: "Good."  Childhood History:  By whom was/is the patient raised?: Both parents Additional childhood history information: Pt's parent are still together. Description of patient's relationship with caregiver when they were a child: "Very good." Patient's description of current relationship with people who raised him/her: "Still really good." Does patient have siblings?: Yes Number of Siblings: 1   (Older sister.) Description of patient's current relationship with siblings: "Very good." Did patient suffer any verbal/emotional/physical/sexual abuse as a child?: No Did patient suffer from severe childhood neglect?: No Has patient ever been sexually abused/assaulted/raped as an adolescent or adult?: No Was the patient ever a victim of a crime or a disaster?: No Witnessed domestic violence?: No Has patient been effected by domestic violence as an adult?: No  Education:  Highest grade of school patient has completed: 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 Currently a student?: No Learning disability?: No  Employment/Work Situation:   Employment situation: On disability Why is patient on disability: Arthritis and has had multiple joint replacement surgeries. How long has patient been on disability: "A little over two years." Patient's job has been impacted by current illness: No What is the longest time patient has a held a job?: For 27 years. 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 Has patient ever been in the [employer]?: No Has patient ever served in combat?: No  Financial Resources:   Financial resources: Insurance claims handler Does patient have a Lawyer or guardian?: No  Alcohol/Substance Abuse:   What has been your use of drugs/alcohol within the last 12 months?: Pt reports relapsed with alcohol as of last October; 15-20, 12 ounce beers everyday. Pt also mentioned abusing his prescribed medicine (Percocet, 6-10, 325mg , daily) and started buying them off the street because his doctor stopped prescribing to him. If attempted suicide, did drugs/alcohol play a role in this?: No Alcohol/Substance Abuse Treatment Hx: Past Tx, Inpatient If yes, describe treatment: Pt has been here frequently for treatment. Has alcohol/substance abuse ever caused legal problems?: Yes (Last week, pt got a DUI. He has had this charge before.)  Social Support  System:   Patient's Community Support System: Good Describe Community Support System: Girlfriend, sister, and parents. Type of faith/religion: Baptist. How does patient's faith help to cope with current illness?: "Trying stay positive and to be the best [place] [he] can be."  Leisure/Recreation:   Leisure and Hobbies: Pt enjoys walking and working in the garden.  Strengths/Needs:   What things does the patient do well?: Pt says he is a good Personnel officer and drives really well. In what areas does patient struggle / problems for patient: Pt just wants to live day-by-day.  Discharge Plan:   Does patient have access to transportation?: Yes [employer] and [employer]	[place]. [place] and Wilma Flavin.) Will patient be returning to same living situation after discharge?: Yes Currently receiving community mental health services: No If no, would patient like referral for services when discharged?: Yes (What county?) [facility]) Does patient have financial barriers related to discharge medications?: Yes Patient description of barriers related to discharge medications: "Just financial."  Summary/Recommendations:   Summary and Recommendations (to be completed by the evaluator): Pt is a 46 year old male seeking treatment alcohol and medication abuse as well as pain management. Recommendations for treatment include crisis stablization, case management, medicine management, psychoeducation groups to teach  coping skills, and counseling group.  Jahaira Earnhart. 02/08/2011

## 2011-02-08 NOTE — Progress Notes (Signed)
Patient ID: [name], male   DOB: 02-Aug-1965, 46 y.o.   MRN: [id number]  Pt was admitted after his father took him to the ER for being intoxicated. Pts blood alcohol level was 407 once at the ER, and he had a CIWA of 14. Pt reported that he wanted detox because he had been drinking heavily for weeks and he had been buying pills of the street. Pt reported being negative SI/HI, no AH/VH noted. Pt reported on admission that he was not feeling well and wanted to see the doctor.

## 2011-02-08 NOTE — ED Notes (Signed)
Pt resting calmly w/ eyes closed. Rise & fall of the chest noted. Bed in low position, side rails up x2. NAD noted at this time.  

## 2011-02-08 NOTE — ED Notes (Signed)
Pt is alert and oriented x 4 with respirations even and unlabored.  NAD at this time.  Admission to [facility] discussed with pt and pt verbalized understanding.  [facility].

## 2011-02-08 NOTE — ED Notes (Signed)
Spoke w/ ACT team to advise of the current events. ACT to follow up in the morning.

## 2011-02-09 LAB — BASIC METABOLIC PANEL
GFR calc Af Amer: >90 mL/min (ref >90)
GFR calc non Af Amer: >90 mL/min (ref >90)
Potassium: 3.5 mEq/L (ref 3.5–5.1)
Sodium: 133 mEq/L — ABNORMAL LOW (ref 135–145)

## 2011-02-09 LAB — CBC
Hemoglobin: 12.5 g/dL — ABNORMAL LOW (ref 13.0–17.0)
Platelets: 91 10*3/uL — ABNORMAL LOW (ref 150–400)
RBC: 4.24 MIL/uL (ref 4.22–5.81)

## 2011-02-09 MED ORDER — NICOTINE 21 MG/24HR TD PT24
21.0000 mg | MEDICATED_PATCH | Freq: Every day | TRANSDERMAL | Status: DC
  Administered 2011-02-10 – 2011-02-12 (×3): 21 mg via TRANSDERMAL
  Filled 2011-02-09 (×5): qty 1

## 2011-02-09 MED ORDER — CHLORDIAZEPOXIDE HCL 25 MG PO CAPS
25.0000 mg | ORAL_CAPSULE | Freq: Three times a day (TID) | ORAL | Status: AC
  Administered 2011-02-11 (×3): 25 mg via ORAL
  Filled 2011-02-09 (×3): qty 1

## 2011-02-09 MED ORDER — CHLORDIAZEPOXIDE HCL 25 MG PO CAPS
25.0000 mg | ORAL_CAPSULE | ORAL | Status: DC
  Administered 2011-02-12: 25 mg via ORAL
  Filled 2011-02-09: qty 1

## 2011-02-09 MED ORDER — LIDOCAINE 5 % EX PTCH
1.0000 | MEDICATED_PATCH | CUTANEOUS | Status: DC
  Administered 2011-02-09 – 2011-02-10 (×2): 1 via TRANSDERMAL
  Filled 2011-02-09 (×3): qty 1

## 2011-02-09 MED ORDER — THIAMINE HCL 100 MG/ML IJ SOLN
100.0000 mg | Freq: Once | INTRAMUSCULAR | Status: AC
  Administered 2011-02-09: 100 mg via INTRAMUSCULAR

## 2011-02-09 MED ORDER — CHLORDIAZEPOXIDE HCL 25 MG PO CAPS: 25.0000 mg | ORAL_CAPSULE | Freq: Every day | ORAL | Status: DC

## 2011-02-09 MED ORDER — ADULT MULTIVITAMIN W/MINERALS CH
1.0000 | ORAL_TABLET | Freq: Every day | ORAL | Status: DC
  Administered 2011-02-10 – 2011-02-12 (×3): 1 via ORAL
  Filled 2011-02-09 (×3): qty 1

## 2011-02-09 MED ORDER — TRAZODONE HCL 50 MG PO TABS
50.0000 mg | ORAL_TABLET | Freq: Every day | ORAL | Status: DC
  Administered 2011-02-09: 50 mg via ORAL
  Filled 2011-02-09 (×3): qty 1

## 2011-02-09 MED ORDER — CHLORDIAZEPOXIDE HCL 25 MG PO CAPS
25.0000 mg | ORAL_CAPSULE | Freq: Four times a day (QID) | ORAL | Status: AC
  Administered 2011-02-09 – 2011-02-10 (×6): 25 mg via ORAL
  Filled 2011-02-09 (×6): qty 1

## 2011-02-09 NOTE — [facility] Suicide Risk Assessment (Signed)
Suicide Risk Assessment  Admission Assessment     Demographic factors:  See chart.  Current Mental Status: Patient seen and evaluated. Chart reviewed. Patient stated that his mood was "good". His affect was mood congruent and euthymic. He denied any current thoughts of self injurious behavior, suicidal ideation or homicidal ideation. He denied any significant depressive signs or symptoms at this time. No hx depression or anxiety noted.  There were no auditory or visual hallucinations, paranoia, delusional thought processes, or mania noted.  Thought process was linear and goal directed.  No psychomotor agitation or retardation was noted. His speech was normal rate, tone and volume. Eye contact was good. Judgment and insight are fair.  Patient has been up and engaged on the unit.  No safety concerns reported from team.   Loss Factors:  Loss Factors: Decline in physical health; chronic joint pain; legal charges for DUI and poss hydrocodone  Historical Factors: Denied past SI, attempts, plans.  Denied pysch hx other than SUDs.   Risk Reduction Factors:  Risk Reduction Factors: Sense of responsibility to family; AA; willingness to f/u with SA Tx; lives with GF, plans to return home.  CLINICAL FACTORS: Alcohol Dependence & W/D; Opioid Dependence and W/D; Hyponatremia; Thrombocytopenia; Hypotension (with clonidine taper)  COGNITIVE FEATURES THAT CONTRIBUTE TO RISK: none.  SUICIDE RISK: Patient is currently viewed as a low risk of harm to himself and others in light of his history and risk factors. There are no acute safety concerns and he is stable for discharge. His continued sobriety, medication management and followup will mitigate against any potential risk in the future. He is agreeable with the plan.  Discussed with the team.  PLAN OF CARE: Discontinued clonidine taper 2/2 hypotension; started librium taper; ordered f/u labs- Chem7; CBC; HgA1c.  Medication education completed.  Pros, cons, risks,  potential side effects and benefits were discussed with pt.  Pt agreeable with the plan.  See orders.  Discussed with team.  Pt wants to f/u with a Suboxone provider s/p discharge.    Pt admitted for crisis stabilization and treatment.  Will continue q15 minute checks per unit protocol.  No clinical indication for one on one level of observation at this time.  Pt contracting for safety.  Mental health treatment, medication management and continued sobriety will mitigate against any increased risk of harm to self and/or others in the future.  Discussed the importance of recovery with pt, as well as, tools to move forward in a healthy & safe manner.  Pt agreeable with the plan.  Discussed with the team.   [name] 02/09/2011, 3:18 PM

## 2011-02-09 NOTE — H&P (Addendum)
Psychiatric Admission Assessment Adult  Patient Identification:  [name] Date of Evaluation:  02/09/2011 Chief Complaint:  Alcohol Dependence 303.90 Opioid Dependence 304.00 History of Present Illness:: [name] is a 46 y.o. male who presents to the Emergency Department for medical clearance. Patient states that he is seeking help for his addiction to ETOH and Oxycodone. Patient reports suicidial ideations but no homicidal ideations. Patient reports that he has approximately 15-20 beers daily, and has been drinking daily "forever". Patient also reports an oxycodone addiction with his last oxycodone taken at 0700 today. Patient notes that approximately 5 years ago, he was hospitalized for ETOH. Patient notes a medical h/o lobotomy (lobectomy) to left lung (with cyst found), and pneumonia (last month). Patient is on disability.   Mood Symptoms:  SI, none currently Depression Symptoms:  depressed mood, suicidal thoughts without plan, (Hypo) Manic Symptoms:  none Anxiety Symptoms:  Excessive Worry, Psychotic Symptoms:  none PTSD Symptoms:none Ever had a traumatic exposure:  No Had a traumatic exposure in the last month:  No Re-experiencing:  None Hypervigilance:  No Hyperarousal:  None Avoidance:  None  Traumatic Brain Injury:  none  Past Psychiatric History: Diagnosis:  Alcohol dependency  Hospitalizations: x2  Outpatient Care:  Substance Abuse Care: [facility], [facility]  Self-Mutilation:  Suicidal Attempts: none  Violent Behaviors: none   Past Medical History:  History reviewed. No pertinent past medical history. History of Loss of Consciousness:  Yes Seizure History:  Yes Cardiac History:  No Allergies:  No Known Allergies Current Medications:  Current Facility-Administered Medications  Medication Dose Route Frequency Provider Last Rate Last Dose  . acetaminophen (TYLENOL) tablet 650 mg  650 mg Oral Q6H PRN [name], PA      . carbamazepine (TEGRETOL) tablet  200 mg  200 mg Oral BID [name], PA   200 mg at 02/09/11 0810  . chlordiazePOXIDE (LIBRIUM) capsule 25 mg  25 mg Oral Q6H PRN [name], PA   25 mg at 02/09/11 0422  . chlordiazePOXIDE (LIBRIUM) capsule 25 mg  25 mg Oral Once [name], PA   25 mg at 02/08/11 2329  . cloNIDine (CATAPRES) tablet 0.1 mg  0.1 mg Oral QID [name], PA   0.1 mg at 02/09/11 1150   Followed by  . cloNIDine (CATAPRES) tablet 0.1 mg  0.1 mg Oral BH-qamhs [name], PA       Followed by  . cloNIDine (CATAPRES) tablet 0.1 mg  0.1 mg Oral QAC breakfast [name], PA      . dicyclomine (BENTYL) tablet 20 mg  20 mg Oral Q4H PRN [name], PA   20 mg at 02/09/11 0422  . hydrOXYzine (ATARAX/VISTARIL) tablet 25 mg  25 mg Oral Q6H PRN [name], PA   25 mg at 02/09/11 0422  . loperamide (IMODIUM) capsule 2-4 mg  2-4 mg Oral PRN [name], PA      . magnesium hydroxide (MILK OF MAGNESIA) suspension 30 mL  30 mL Oral Daily PRN [name], PA      . methocarbamol (ROBAXIN) tablet 500 mg  500 mg Oral Q8H PRN [name], PA   500 mg at 02/08/11 2107  . mulitivitamin with minerals tablet 1 tablet  1 tablet Oral Daily [name], [place]   1 tablet at 02/09/11 0813  . naproxen (NAPROSYN) tablet 500 mg  500 mg Oral BID PRN [name], PA   500 mg at 02/08/11 1535  . nicotine (NICODERM CQ - dosed  in mg/24 hours) patch 21 mg  21 mg Transdermal Q0600 [name], PA      . ondansetron (ZOFRAN-ODT) disintegrating tablet 4 mg  4 mg Oral Q6H PRN [name], PA   4 mg at 02/09/11 0424  . thiamine (B-1) injection 100 mg  100 mg Intramuscular Once [name], PA   100 mg at 02/08/11 1325  . thiamine (VITAMIN B-1) tablet 100 mg  100 mg Oral Daily [name], PA   100 mg at 02/09/11 0813  . DISCONTD: hydrOXYzine (ATARAX/VISTARIL) tablet 25 mg  25 mg Oral Q6H PRN [name], PA      . DISCONTD: loperamide (IMODIUM) capsule 2-4 mg  2-4 mg Oral PRN [name], PA      . DISCONTD: nicotine (NICODERM CQ - dosed in mg/24 hours) patch 14 mg  14 mg Transdermal Once  [name], PA   14 mg at 02/08/11 1325  . DISCONTD: ondansetron (ZOFRAN-ODT) disintegrating tablet 4 mg  4 mg Oral Q6H PRN [name], PA        Previous Psychotropic Medications:  Medication Dose                        Substance Abuse History in the last 12 months: Substance Age of 1st Use Last Use Amount Specific Type  Nicotine      Alcohol      Cannabis      Opiates      Cocaine      Methamphetamines      LSD      Ecstasy      Benzodiazepines      Caffeine      Inhalants      Others:                         Medical Consequences of Substance Abuse:  Legal Consequences of Substance Abuse:  Family Consequences of Substance Abuse:  Blackouts:  Yes DT's:  No Withdrawal Symptoms:  Cramps Diaphoresis Nausea Tremors  Social History: Current Place of Residence:   Place of Birth:   Family Members: Marital Status:  Single Children:  Sons:  Daughters: Relationships: Education:  Corporate treasurer Problems/Performance: Religious Beliefs/Practices: History of Abuse (Emotional/Phsycial/Sexual) Occupational Experiences; Hotel manager History:  none Legal History: Hobbies/Interests: Family History:  History reviewed. No pertinent family history. ROS: negative with the exception of withdrawal symptoms PE: Completed in ED. Those results have been reviewed and no acute findings with the variances noted. Mental Status Examination/Evaluation: Objective:  Appearance: Fairly Groomed  Patent attorney::  Good  Speech:  Clear and Coherent  Volume:  Normal  Mood:  anxiouw  Affect:  Congruent  Thought Process:  Linear  Orientation:  Full  Thought Content:  Hallucinations: None  Suicidal Thoughts:  No  Homicidal Thoughts:  No  Judgement:  Impaired  Insight:  Lacking  Psychomotor Activity:  Normal  Akathisia:  No  Handed:  Right  AIMS (if indicated):     Assets:  Desire for Improvement    Laboratory/X-Ray Psychological Evaluation(s)      Assessment:   AXIS I   alcohol dependence, opiate dependence  AXIS II Deferred  AXIS III Hip, knee replacements, previous CVA during hip replacement Wound to heel of foot.  AXIS IV economic problems and problems with access to health care services  AXIS V 51-60 moderate symptoms   Treatment Plan/Recommendations: Pt. Is requesting referral to Suboxone clinic upon discharge.  Treatment Plan  Summary: Daily contact with patient to assess and evaluate symptoms and progress in treatment Medication management  Observation Level/Precautions:  Detox  Laboratory:    Psychotherapy:    Medications:    Routine PRN Medications:  Yes  Consultations:    Discharge Concerns:    Other:      Eloyse Causey 1/21/201312:45 PM

## 2011-02-09 NOTE — Progress Notes (Signed)
[facility] Group Notes:  (Counselor/Nursing/MHT/Case Management/Adjunct)  02/09/2011 4:11 PM   Type of Therapy:  Processing Group at 11:00 am  Participation Level:  Minimal  Participation Quality:  Appropriate  Affect:  Depressed  Cognitive:  Oriented  Insight:  Limited  Engagement in Group:  Limited  Engagement in Therapy:  Limited  Modes of Intervention:  Exploration and support  Summary of Progress/Problems:  Patient was comfortable in sharing that he sought detox for himself and feels he is ready to recommit to sobriety yet was no active during discussion of obstacles to recovery other than triggers.  Triggers named by Royal Hawthorn include "stores that sell beer, bars, refrigerators and music."  [facility] Group Notes:  (Counselor/Nursing/MHT/Case Management/Adjunct)  02/09/2011 4:11 PM   Type of Therapy:  Counseling Group at 1:15 pm  Participation Level:  Did Not Attend   Ronda Fairly, LCSWA 02/09/2011 4:11 PM

## 2011-02-09 NOTE — Discharge Planning (Signed)
New patient attended AM group, good participation.  Here for help with alcohol.  Has a year or so of sobriety prior to relapse 3 mos ago.  Has no idea what caused relapse.  Lives with girlfriend, and parents and adult son live just down the road.  When sober was attending AA mtgs in [place].  Plans to return and also get a sponsor this time.  C/O withdrawal symptoms today. Per State Regulation 482.30 This chart was reviewed for medical necessity with respect to the patient's Admission/Duration of stay. Daryel Gerald, [place]  02/09/2011  Next Review Date:  02/12/11

## 2011-02-09 NOTE — Progress Notes (Signed)
Pt is in a good mood today. Pt is going to groups and interacting well with peers and staff. Pt had some complaints of anxiety and constipation was given the appropriate medication for both issues. Pt was offered support and encouragement. Pt is receptive to treatment and safety maintained to unit.

## 2011-02-10 LAB — HEMOGLOBIN A1C
Hgb A1c MFr Bld: 5.7 % — ABNORMAL HIGH (ref <5.7)
Mean Plasma Glucose: 117 mg/dL — ABNORMAL HIGH (ref <117)

## 2011-02-10 MED ORDER — TRAZODONE HCL 50 MG PO TABS
150.0000 mg | ORAL_TABLET | Freq: Every day | ORAL | Status: DC
  Administered 2011-02-10 – 2011-02-11 (×2): 150 mg via ORAL
  Filled 2011-02-10 (×3): qty 3

## 2011-02-10 MED ORDER — TRAZODONE HCL 50 MG PO TABS
50.0000 mg | ORAL_TABLET | Freq: Once | ORAL | Status: AC
  Administered 2011-02-10: 50 mg via ORAL

## 2011-02-10 MED ORDER — LIDOCAINE 5 % EX PTCH
1.0000 | MEDICATED_PATCH | CUTANEOUS | Status: DC
  Administered 2011-02-11 – 2011-02-12 (×2): 1 via TRANSDERMAL
  Filled 2011-02-10 (×4): qty 1

## 2011-02-10 NOTE — Progress Notes (Signed)
Recreation Therapy Notes  02/10/2011         Time: 1415      Group Topic/Focus: The focus of this group is on discussing various styles of communication and communicating assertively using 'I' (feeling) statements.  Participation Level: Active  Participation Quality: Attentive  Affect: Blunted  Cognitive: Oriented   Additional Comments: None.   Kamryn Messineo 02/10/2011 3:41 PM 

## 2011-02-10 NOTE — Progress Notes (Signed)
[facility] MD Progress Note  02/10/2011 5:20 PM  Diagnosis:  Alcohol/opiate dependency  ADL's:  Intact  Sleep:  No poor sleep.  Pt. Requesting increase in medication for sleep.   Appetite:  Yes,  AEB: Subjective: The patient was seen and evaluated by the Treatment team consisting of Psychiatrist, PAC, RN, Case Manager, and Therapist for evaluation and treatment plan with goal of stabilization upon discharge. The patient's physical and mental health problems were identified and treated appropriately.         Suicidal Ideation:   Plan:  No  Intent:  No  Means:  No  Homicidal Ideation:   Plan:  No  Intent:  No  Means:  No  AEB (as evidenced by):  Mental Status: General Appearance [name]:  Casual Eye Contact:  Good Motor Behavior:  Normal Speech:  Normal Level of Consciousness:  Alert Mood:  Anxious Affect:  Appropriate Anxiety Level:  Minimal Thought Process:  Coherent Thought Content:  WNL Perception:  Normal Judgment:  Fair Insight:  Present Cognition:  Orientation time, place and person Sleep:  Number of Hours: 2   Vital Signs:Blood pressure 103/72, pulse 103, temperature 97.9 F (36.6 C), temperature source Oral, resp. rate 20, height 6\' 3"  (1.905 m), weight 160 lb (72.576 kg).  Lab Results:  Results for orders placed during the hospital encounter of 02/08/11 (from the past 48 hour(s))  BASIC METABOLIC PANEL     Status: Abnormal   Collection Time   02/09/11  7:46 PM      Component Value Range Comment   Sodium 133 (*) 135 - 145 (mEq/L)    Potassium 3.5  3.5 - 5.1 (mEq/L)    Chloride 101  96 - 112 (mEq/L)    CO2 21  19 - 32 (mEq/L)    Glucose, Bld 178 (*) 70 - 99 (mg/dL)    BUN 8  6 - 23 (mg/dL)    Creatinine, Ser 0.98  0.50 - 1.35 (mg/dL)    Calcium 8.6  8.4 - 10.5 (mg/dL)    GFR calc non Af Amer >90  >90 (mL/min)    GFR calc Af Amer >90  >90 (mL/min)   CBC     Status: Abnormal   Collection Time   02/09/11  7:46 PM      Component Value Range  Comment   WBC 4.3  4.0 - 10.5 (K/uL)    RBC 4.24  4.22 - 5.81 (MIL/uL)    Hemoglobin 12.5 (*) 13.0 - 17.0 (g/dL)    HCT 11.9 (*) 14.7 - 52.0 (%)    MCV 89.2  78.0 - 100.0 (fL)    MCH 29.5  26.0 - 34.0 (pg)    MCHC 33.1  30.0 - 36.0 (g/dL)    RDW 82.9 (*) 56.2 - 15.5 (%)    Platelets 91 (*) 150 - 400 (K/uL) SPECIMEN CHECKED FOR CLOTS  HEMOGLOBIN A1C     Status: Abnormal   Collection Time   02/09/11  7:46 PM      Component Value Range Comment   Hemoglobin A1C 5.7 (*) <5.7 (%)    Mean Plasma Glucose 117 (*) <117 (mg/dL)     Assessment: Low platelets worse today. ?due to [facility]? Will monitor and recheck in AM                         Elevated HgbA1c advised patient of results and that he was at risk for type  2 diabetes. CIWA:  CIWA-Ar Total: 0  COWS:  COWS Total Score: 5   Treatment Plan Summary: Daily contact with patient to assess and evaluate symptoms and progress in treatment Medication management  Plan: Increase trazodone to 150mg  at hs.            Repeat CBC in AM.  Dalton Mille 02/10/2011, 5:20 PM

## 2011-02-10 NOTE — Treatment Plan (Signed)
Interdisciplinary Treatment Plan Update (Adult)  Date: 02/10/2011  Time Reviewed: 10:16 AM   Progress in Treatment: Attending groups: Yes Participating in groups: Yes Taking medication as prescribed: Yes Tolerating medication: Yes   Family/Significant othe contact made: Counselor to contact if Suicide prevention needed  Patient understands diagnosis:  Yes  Asking for help with alcoholism  Discussing patient identified problems/goals with staff:  Yes  See below Medical problems stabilized or resolved:  Yes Denies suicidal/homicidal ideation: Yes Issues/concerns per patient self-inventory:  Asking for help with sleep, with foot pain Other:  New problem(s) identified: N/A  Reason for Continuation of Hospitalization: Medication stabilization Withdrawal symptoms  Interventions implemented related to continuation of hospitalization: Librium detox protocol  Encourage group attendance and participation  Additional comments:  Estimated length of stay: 2 days  Discharge Plan:Return home,  Follow up [facility] and AA mtgs.  New goal(s): N/A  Review of initial/current patient goals per problem list:   1.  Goal(s):safely detox from alcohol  Met:  No  Target date1/24:  As evidenced [id number] in CIWA score from current to 0  2.  Goal (s):Identify comprehensive sobriety plan  Met:  Yes  Target date:  As evidenced by:  3.  Goal(s):  Met:  Yes  Target date:  As evidenced by:  4.  Goal(s):  Met:  Yes  Target date:  As evidenced by:  Attendees: Patient: [name]  02/10/2011 10:16 AM  Family:     Physician:  Lupe Carney 02/10/2011 10:16 AM   Nursing: Carolynn Comment   02/10/2011 10:16 AM   Case Manager:  Richelle Ito, LCSW 02/10/2011 10:16 AM   Counselor:  Ronda Fairly, LCSWA 02/10/2011 10:16 AM   Other:  Shelda Jakes 02/10/2011 10:16 AM  Other:     Other:     Other:      Scribe for Treatment Team:   Ida Rogue, 02/10/2011 10:16 AM

## 2011-02-10 NOTE — Progress Notes (Signed)
Patient has been up and in the milieu most of the day.  Has been attending groups and interacting well with staff and peers.  Patient complained this morning of a sore foot.  Stated he had surgery in the past and had dressing changed for a long time.  Large callous on the bottom of his left foot behind his great toe.  He has had some bleeding from the area last night and this morning.  Discussed with MD.  Order obtained for Silvadene dressing changes daily.  First application done early this afternoon.  MD also gave verbal permission for patient to have his shoe laces to allow for greater support to the bottom of his foot.  He has been pleasant and cooperative today.  Progressing well through the detox process.

## 2011-02-10 NOTE — Progress Notes (Signed)
Pt visible on the unit and participating with unit activities.  Pt attended evening group.  Pt is still having withdrawal symptoms, but they are not as bad as last night.  His main concern is getting sleep tonight.  His Trazodone was increased to 150mg  which will be given at med pass.  Pt voices no other concerns.  Safety maintained with q15 minute checks.

## 2011-02-10 NOTE — Progress Notes (Signed)
[facility] Group Notes:  (Counselor/Nursing/MHT/Case Management/Adjunct)  02/10/2011 3:16 PM  Type of Therapy:  Group Therapy at 11:00AM  Participation Level:  Did Not Attend; patient was being treated by nurse; something to do with foot wound  Linzy Darling, Julious Payer 02/10/2011, 3:16 PM

## 2011-02-11 ENCOUNTER — Inpatient Hospital Stay (HOSPITAL_COMMUNITY)
Admission: AD | Admit: 2011-02-11 | Discharge: 2011-02-11 | Disposition: A | Discharge status: Home / Self Care | Payer: Medicare Other | Source: Ambulatory Visit | Attending: Physician Assistant | Admitting: Physician Assistant

## 2011-02-11 MED ORDER — GADOBENATE DIMEGLUMINE 529 MG/ML IV SOLN
15.0000 mL | Freq: Once | INTRAVENOUS | Status: AC | PRN
  Administered 2011-02-11: 15 mL via INTRAVENOUS

## 2011-02-11 NOTE — Progress Notes (Signed)
[facility] Group Notes:  (Counselor/Nursing/MHT/Case Management/Adjunct)  02/11/2011 12:46 PM   Type of Therapy:  Processing Group at 11:00 am  Participation Level:  Did not attend   [facility] Group Notes:  (Counselor/Nursing/MHT/Case Management/Adjunct)  02/11/2011 12:46 PM   Type of Therapy:  Counseling Group at 1:15 pm  Participation Level:  Moderate  Participation Quality:  Attentive and sharing  Affect:  Depressed, patient experiencing the pain  Cognitive:  Appropriate  Insight:  Improved  Engagement in Group:  Good  Engagement in Therapy:  Minimal  Modes of Intervention:  Exploration, education and support  Summary of Progress/Problems:  Although [name] came into group past midpoint of session he was attentive and sharing at several points. [name] related well to the need to have someone in your life he can be honest with share his  pain. Patient also shared about the sense of peace and comfort he sometimes receives in a 12 step meeting.  Ronda Fairly, LCSWA 02/11/2011 12:46 PM

## 2011-02-11 NOTE — Progress Notes (Addendum)
  [name] 46 y.o. male Dx. Opiate dependency and Alcohol dependency Subjective:   Pt. Is upright and mobile but has complaints of foot pain due to the lesion on the ball of  His left foot.Sleep is reported as poor, the trazodone let him sleep for about an hour and 1/2 but his roommate snores" . The patient reports no for side effects of the medications. Appetite is normal.  He is asking to be discharged today.  He is insistent and states he feels fine.  Objective:    Pt. Is alert and oriented x3. He is cooperative.  Eye contact is good.  Speech is clear and goal directed. Mood is anxious and affect is congruent. The patient denies SI,HI, and shows no signs of psychosis including AH, VH, TH.  Thought process is linear, and thought content is normal. He is focused on leaving and repeatedly tries to negotiate leaving today. His insight is poor and his judgement is fair.  He states his reason for leaving by 11 am tomorrow is to pay bills. His foot has a 2" circular lesion to the ball of his left foot.  There is no redness, no edema. No drainage.  It appears to need debridement.   Assessment/Plan: Opiate/Alcohol dependency                                   Possible med seeking behaviors                                  Ulcerative lesion to L foot.  Continue current plan of care.  Wound Care nurse is consulted to evaluate and recommend treatment. No other change in treatment plan at this time.  Pt. Is made aware that he will need to follow the treatment plan as we discussed and that discharge tomorrow is possible, but it will not be until after treatment team has met.     Rona Ravens. Yolette Hastings, [facility]  02/11/2011, 1:36 PM   Left message with Dr. Garlan Fillers at [phone] for Dr. Garlan Fillers to return my call.

## 2011-02-11 NOTE — Progress Notes (Signed)
Foot ulcer The wound care RN "Vernona Rieger" came over to evaluated the lesion with me. We both agree that this is significantly close enough to the bone that it warrants further evaluation by surgeon to rule out osteomyelitis.  I have spoken with Dr. Mady Gemma the DP who operated on his ankle. Dr. Mady Gemma could not see the patient for several weeks and felt that he should be debrided ASAP to prevent possible bone loss.  After much waiting and confusion from [facility] I was finally contacted by Dr. Carola Frost at [facility]. He recommends holding the patient until Dr. Lajoyce Corners can evaluate him tomorrow.  Dr. Carola Frost agreed that CBC,Diff, LFTs, SedRate, CMP as well as MRI could be done tonight and will follow the results.  Lab tech came to draw labs while the patient was having MRI. Labs were rescheduled for AM. The RN at [facility] was notified that these labs could have been drawn at the hospital while the patient was there for MRI and should not be delayed.  She will have them drawn stat tonight. Chart will be flagged to contact me for abnormal results tonight. Rona Ravens. Kirby Cortese PAC

## 2011-02-11 NOTE — Progress Notes (Signed)
Pt states that he slept poor, appetite is good. Focus is good. Pt states he has no cravings, denies SI/HI. Pt c/o left foot, right knee pain, and his pain goal is 7. Pt has made goals of "going to AA, family support and church support. Pt stated his goals are in his self inventory of AA, [place] support, church support. Pt also asked, "Can I go home today so I can rest, my roommate snores so loudly I can't get to sleep. I am ok to leave today."

## 2011-02-11 NOTE — Progress Notes (Signed)
Pt returned from the hospital a short time ago.  Dressing applied to L foot.  HS meds given.

## 2011-02-11 NOTE — Progress Notes (Signed)
Pt sitting in the dayroom in a wheelchair waiting to be transported to the hospital for a MRI to his L foot.  Pt reports he is anxious because he wants to be discharged tomorrow by 1100.  He is concerned that there is more going on with his foot than originally thought and he fears he may lose it.  Encouraged pt to think on a positive side and not dwell on negative thoughts.  Pt reports that otherwise he is doing good and has minimal withdrawal symptoms.  He feels his anxiety is from worry over his foot and not withdrawal.  Will continue to monitor.  Safety maintained with q15 minute checks.

## 2011-02-12 ENCOUNTER — Encounter (HOSPITAL_COMMUNITY): Payer: Self-pay | Admitting: *Deleted

## 2011-02-12 ENCOUNTER — Inpatient Hospital Stay (HOSPITAL_COMMUNITY)
Admission: EM | Admit: 2011-02-12 | Discharge: 2011-02-15 | DRG: 989 | Disposition: A | Discharge status: Home / Self Care | Payer: Medicare Other | Attending: Internal Medicine | Admitting: Internal Medicine

## 2011-02-12 DIAGNOSIS — Z96649 Presence of unspecified artificial hip joint: Secondary | ICD-10-CM

## 2011-02-12 DIAGNOSIS — F1020 Alcohol dependence, uncomplicated: Secondary | ICD-10-CM | POA: Diagnosis present

## 2011-02-12 DIAGNOSIS — G589 Mononeuropathy, unspecified: Principal | ICD-10-CM | POA: Diagnosis present

## 2011-02-12 DIAGNOSIS — R7309 Other abnormal glucose: Secondary | ICD-10-CM | POA: Diagnosis present

## 2011-02-12 DIAGNOSIS — L97521 Non-pressure chronic ulcer of other part of left foot limited to breakdown of skin: Secondary | ICD-10-CM | POA: Diagnosis present

## 2011-02-12 DIAGNOSIS — L0390 Cellulitis, unspecified: Secondary | ICD-10-CM

## 2011-02-12 DIAGNOSIS — L97509 Non-pressure chronic ulcer of other part of unspecified foot with unspecified severity: Secondary | ICD-10-CM | POA: Diagnosis present

## 2011-02-12 DIAGNOSIS — R7301 Impaired fasting glucose: Secondary | ICD-10-CM | POA: Diagnosis present

## 2011-02-12 DIAGNOSIS — Z96659 Presence of unspecified artificial knee joint: Secondary | ICD-10-CM

## 2011-02-12 HISTORY — DX: Type 2 diabetes mellitus with foot ulcer: E11.621

## 2011-02-12 HISTORY — DX: Non-pressure chronic ulcer of other part of unspecified foot with unspecified severity: L97.509

## 2011-02-12 HISTORY — DX: Pneumonia, unspecified organism: J18.9

## 2011-02-12 LAB — DIFFERENTIAL
Basophils Relative: 0 % (ref 0–1)
Lymphocytes Relative: 38 % (ref 12–46)
Lymphs Abs: 2.5 10*3/uL (ref 0.7–4.0)
Monocytes Relative: 10 % (ref 3–12)
Neutro Abs: 3.3 10*3/uL (ref 1.7–7.7)
Neutrophils Relative %: 49 % (ref 43–77)

## 2011-02-12 LAB — BASIC METABOLIC PANEL
CO2: 25 mEq/L (ref 19–32)
Chloride: 104 mEq/L (ref 96–112)
Potassium: 3.8 mEq/L (ref 3.5–5.1)
Sodium: 137 mEq/L (ref 135–145)

## 2011-02-12 LAB — CBC
Hemoglobin: 13 g/dL (ref 13.0–17.0)
MCHC: 32.9 g/dL (ref 30.0–36.0)
MCV: 91.6 fL (ref 78.0–100.0)
Platelets: 124 10*3/uL — ABNORMAL LOW (ref 150–400)
RDW: 17.1 % — ABNORMAL HIGH (ref 11.5–15.5)
RDW: 17.2 % — ABNORMAL HIGH (ref 11.5–15.5)
WBC: 6.7 10*3/uL (ref 4.0–10.5)
WBC: 6.7 10*3/uL (ref 4.0–10.5)

## 2011-02-12 LAB — HEPATIC FUNCTION PANEL
AST: 51 U/L — ABNORMAL HIGH (ref 0–37)
Albumin: 3.3 g/dL — ABNORMAL LOW (ref 3.5–5.2)
Total Protein: 6.3 g/dL (ref 6.0–8.3)

## 2011-02-12 LAB — CREATININE, SERUM: GFR calc Af Amer: >90 mL/min (ref >90)

## 2011-02-12 LAB — SEDIMENTATION RATE: Sed Rate: 1 mm/hr (ref 0–16)

## 2011-02-12 MED ORDER — CLINDAMYCIN PHOSPHATE 900 MG/50ML IV SOLN
900.0000 mg | INTRAVENOUS | Status: AC
  Administered 2011-02-12: 900 mg via INTRAVENOUS
  Filled 2011-02-12: qty 50

## 2011-02-12 MED ORDER — ENOXAPARIN SODIUM 40 MG/0.4ML ~~LOC~~ SOLN
40.0000 mg | SUBCUTANEOUS | Status: DC
  Administered 2011-02-12: 40 mg via SUBCUTANEOUS
  Filled 2011-02-12 (×4): qty 0.4

## 2011-02-12 MED ORDER — SODIUM CHLORIDE 0.9 % IJ SOLN: 3.0000 mL | Freq: Two times a day (BID) | INTRAMUSCULAR | Status: DC

## 2011-02-12 MED ORDER — PIPERACILLIN-TAZOBACTAM 3.375 G IVPB
3.3750 g | Freq: Three times a day (TID) | INTRAVENOUS | Status: DC
  Administered 2011-02-12 – 2011-02-15 (×8): 3.375 g via INTRAVENOUS
  Filled 2011-02-12 (×9): qty 50

## 2011-02-12 MED ORDER — OXYCODONE HCL 5 MG PO TABS
5.0000 mg | ORAL_TABLET | ORAL | Status: DC | PRN
  Administered 2011-02-12 – 2011-02-13 (×5): 5 mg via ORAL
  Filled 2011-02-12 (×5): qty 1

## 2011-02-12 MED ORDER — OXYCODONE-ACETAMINOPHEN 5-325 MG PO TABS
2.0000 | ORAL_TABLET | Freq: Once | ORAL | Status: AC
  Administered 2011-02-12: 2 via ORAL
  Filled 2011-02-12: qty 2

## 2011-02-12 MED ORDER — VANCOMYCIN HCL IN DEXTROSE 1-5 GM/200ML-% IV SOLN
1000.0000 mg | INTRAVENOUS | Status: AC
  Administered 2011-02-12: 1000 mg via INTRAVENOUS
  Filled 2011-02-12 (×2): qty 200

## 2011-02-12 MED ORDER — SODIUM CHLORIDE 0.9 % IV SOLN
INTRAVENOUS | Status: AC
  Administered 2011-02-12: 18:00:00 via INTRAVENOUS

## 2011-02-12 MED ORDER — VANCOMYCIN HCL IN DEXTROSE 1-5 GM/200ML-% IV SOLN
1000.0000 mg | Freq: Two times a day (BID) | INTRAVENOUS | Status: DC
  Administered 2011-02-12 – 2011-02-15 (×6): 1000 mg via INTRAVENOUS
  Filled 2011-02-12 (×6): qty 200

## 2011-02-12 MED ORDER — PIPERACILLIN-TAZOBACTAM 3.375 G IVPB
3.3750 g | INTRAVENOUS | Status: AC
  Administered 2011-02-12: 3.375 g via INTRAVENOUS
  Filled 2011-02-12 (×2): qty 50

## 2011-02-12 MED ORDER — ONDANSETRON HCL 4 MG/2ML IJ SOLN
INTRAMUSCULAR | Status: AC
  Administered 2011-02-12: 4 mg
  Filled 2011-02-12: qty 2

## 2011-02-12 MED ORDER — SODIUM CHLORIDE 0.9 % IV SOLN: 250.0000 mL | INTRAVENOUS | Status: DC | PRN

## 2011-02-12 MED ORDER — SODIUM CHLORIDE 0.9 % IJ SOLN: 3.0000 mL | INTRAMUSCULAR | Status: DC | PRN

## 2011-02-12 MED ORDER — NICOTINE 21 MG/24HR TD PT24
MEDICATED_PATCH | TRANSDERMAL | Status: AC
  Administered 2011-02-12: 21 mg
  Filled 2011-02-12: qty 1

## 2011-02-12 NOTE — ED Notes (Signed)
Foot ulcer is on the bottom of pt left foot.  Dressing dry and intact.
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
## 2011-02-12 NOTE — [facility] Suicide Risk Assessment (Signed)
Suicide Risk Assessment  Discharge Assessment        Demographic factors:  See chart.  Current Mental Status: Patient seen and evaluated in team. Chart reviewed. Patient stated that his mood was "very good". His affect was mood congruent and euthymic. He denied any current thoughts of self injurious behavior, suicidal ideation or homicidal ideation. He denied any significant depressive signs or symptoms at this time. No hx depression or anxiety noted.  There were no auditory or visual hallucinations, paranoia, delusional thought processes, or mania noted.  Thought process was linear and goal directed.  No psychomotor agitation or retardation was noted. His speech was normal rate, tone and volume. Eye contact was good. Judgment and insight are fair.  Patient has been up and engaged on the unit.  No acute safety concerns reported from team.   Loss Factors:  Loss Factors: Decline in physical health; chronic joint pain; legal charges for DUI and poss hydrocodone  Historical Factors: Denied past SI, attempts, plans.  Denied pysch hx other than SUDs.   Risk Reduction Factors:  Risk Reduction Factors: Sense of responsibility to family; AA; willingness to f/u with SA Tx; lives with GF, plans to return home.  CLINICAL FACTORS: Alcohol Dependence; Opioid Dependence; Hyponatremia, resolved; Thrombocytopenia; Transaminitis; Plantar forefoot ulceration with mild surrounding cellulitis.  COGNITIVE FEATURES THAT CONTRIBUTE TO RISK: none.  SUICIDE RISK: Patient is currently viewed as a low risk of harm to himself and others in light of his history and risk factors. There are no acute safety concerns and he is stable for discharge. His continued sobriety, medication management and followup will mitigate against any potential risk in the future. He is agreeable with the plan.  Discussed with the team.  PLAN OF CARE: Pt stable for and requesting discharge, yet needs to be discharged and re-admitted to medicine for  IV antibiotic Tx.  Pt contracting for safety and does not currently meet [facility] involuntary commitment criteria for continued hospitalization.  Mental health treatment, medication management and continued sobriety will mitigate against the increased risk of harm to self and/or others.  Discussed the importance of recovery further with pt, as well as, tools to move forward in a healthy & safe manner.  Pt agreeable with the plan.  Discussed with the team.  Please see orders, follow up plans per team and full discharge summary to be completed by physician extender.   Kuroski-Mazzei, Ernie Sagrero 02/12/2011, 11:08 AM

## 2011-02-12 NOTE — ED Notes (Signed)
Pts dinner tray ordered.  

## 2011-02-12 NOTE — ED Notes (Signed)
Pt has a ulcer on the bottom of his left foot.  While inpatient at [facility] he was sent for MRI yesterday at [facility].  Today he was told to come to [facility] to be admitted for this as they are worried about the infection in this left foot spreading to the bone.  Pt reports that Dr. Carola Frost 's PA will see him here.  MD that sees pt is to page Dr. Carola Frost

## 2011-02-12 NOTE — ED Provider Notes (Signed)
History     CSN: [id number]  Arrival date & time 02/12/11  1302   First MD Initiated Contact with Patient 02/12/11 1519      Chief Complaint  Patient presents with  . Cellulitis    (Consider location/radiation/quality/duration/timing/severity/associated sxs/prior treatment) HPI Comments: Patient presents to the emergency room today with an ulcer on the bottom of his left foot. He was previously behavioral health he was discharged today and has a followup appointment with [facility] on Monday. He has had about a week history of an ulcer to the bottom of his left foot. He had an MRI of his foot yesterday which showed some evidence of myositis but no evidence of osteomyelitis. The results were discussed with Dr. heart beat with orthopedics to advise patient be sent over here for admission and IV antibiotics. Patient complains of pain to the foot at the area of the ulceration. Denies any history of infections in the past. Tonight he fevers to 90 chills denies he nausea or vomiting.  The history is provided by the patient.    Past Medical History  Diagnosis Date  . Diabetic foot ulcer   . Diabetes mellitus   . Pneumonia     Past Surgical History  Procedure Date  . Total hip arthroplasty   . Total knee arthroplasty   . Joint replacement   . Lung lobectomy     No family history on file.  History  Substance Use Topics  . Smoking status: Current Everyday Smoker -- 2.0 packs/day  . Smokeless tobacco: Not on file  . Alcohol Use: 0.0 oz/week    15-20 drink(s) per week      Review of Systems  Constitutional: Negative for fever, chills, diaphoresis and fatigue.  HENT: Negative for congestion, rhinorrhea and sneezing.   Eyes: Negative.   Respiratory: Negative for cough, chest tightness and shortness of breath.   Cardiovascular: Negative for chest pain and leg swelling.  Gastrointestinal: Negative for nausea, vomiting, abdominal pain, diarrhea and blood in stool.  Genitourinary:  Negative for frequency, hematuria, flank pain and difficulty urinating.  Musculoskeletal: Negative for back pain and arthralgias.  Skin: Negative for rash.  Neurological: Negative for dizziness, speech difficulty, weakness, numbness and headaches.  Psychiatric/Behavioral: Positive for behavioral problems.    Allergies  Review of patient's allergies indicates no known allergies.  Home Medications  No current outpatient prescriptions on file.  BP 116/73  Pulse 76  Temp(Src) 98.3 F (36.8 C) (Oral)  Resp 20  SpO2 99%  Physical Exam  Constitutional: He is oriented to person, place, and time. He appears well-developed and well-nourished.  HENT:  Head: Normocephalic and atraumatic.  Eyes: Pupils are equal, round, and reactive to light.  Neck: Normal range of motion. Neck supple.  Cardiovascular: Normal rate, regular rhythm and normal heart sounds.   Pulmonary/Chest: Effort normal and breath sounds normal. No respiratory distress. He has no wheezes. He has no rales. He exhibits no tenderness.  Abdominal: Soft. Bowel sounds are normal. There is no tenderness. There is no rebound and no guarding.  Musculoskeletal: Normal range of motion. He exhibits no edema.       2 cm callused area to the plantar surface of the left foot at the first MTP joint. There is very minimal surrounding erythema. Minimal drainage from the areas noted.  Lymphadenopathy:    He has no cervical adenopathy.  Neurological: He is alert and oriented to person, place, and time.  Skin: Skin is warm and dry. No rash noted.  Psychiatric: He has a normal mood and affect.    ED Course  Procedures (including critical care time)   Labs Reviewed  CBC  CREATININE, SERUM   [name] Wo Contrast  02/12/2011  *RADIOLOGY REPORT*  Clinical Data: Plantar foot ulcer.  MRI OF THE LEFT FOREFOOT WITHOUT AND WITH CONTRAST  Technique:  Multiplanar, multisequence MR imaging was performed both before and after administration of  intravenous contrast.  Contrast: 15mL MULTIHANCE GADOBENATE DIMEGLUMINE 529 MG/ML IV SOLN  Comparison: None  Findings: There is a small ulceration on the plantar aspect of the forefoot overlying the region of the first metatarsal head.  No discrete soft tissue abscess.  Mild surrounding cellulitis.  The medial forefoot and midfoot musculature demonstrates diffuse fatty infiltration and diffuse increased T2 signal intensity and enhancement suggesting myositis.  No focal soft tissue abscess.  No findings to suggest osteomyelitis.  The flexor and extensor tendons are intact.  No findings for septic tenosynovitis.  IMPRESSION:  1.  No MR findings to suggest osteomyelitis or septic arthritis and no soft tissue abscess. 2.  Plantar forefoot ulceration with mild surrounding cellulitis. 3.  Diffuse fatty infiltration of the foot musculature and myositis.  Original Report Authenticated By: P. Loralie Champagne, M.D.     1. Cellulitis       MDM  Patient was given IV clindamycin. He had recent blood work done yesterday. From behavioral health so I did not feel that this needed to be repeated. I consult with Dr. Susette Racer who request that the hospitalist admit the patient he will consult on the patient in the morning I spoke with Dr. Ashley Royalty with [facility] who will make the patient        Rolan Bucco, MD 02/12/11 1750

## 2011-02-12 NOTE — Consults (Signed)
WOC consult Note Reason for Consult:Wound Consult Conducted on 02/11/11 4pm.  (Late Entry) Wound type:Diabetic (neoropathic) foot ulcer-chronic-left foot, first metatarsal head.  (Also noted is small callus on right foot, first metatarsal head measuring 1cm round.) Measurement: 3cm x 2cm callus with ecchymosis at 9 o'clock measuring 2.5cm Wound bed: not visualized as wound exists beneath callus. Drainage (amount, consistency, odor) scant yellow on old dressing Periwound:erythematous, not warm to touch, not firm (indurated) Dressing procedure/placement/frequency: Dry guaze dressing in place. Discussed wound status/my impression with patient's nurse practitioner and we together determined that patient's needs would best be served by an ortho consultation.  He has been a patient of an orthopedic practice in the past and has been seen as an outpatient by the [facility]. It is appropriate for this ulcer (left foot, first metatarsal head) to be evaluated for debridement and for the existence of osteomyelitis by an orthopedic physician.  Patient's NP to initiate consult to ortho MD on call for [facility] for further direction. I will not follow.  Please re-consult if needed. Thanks, Ladona Mow, MSN, RN, [facility], CWOCN [phone])

## 2011-02-12 NOTE — Progress Notes (Signed)
Patient discharged with instructions to report to admissions department at [facility] campus.  Belongings retrieved from locker.  Patient left the unit via wheelchair with his parents.  He was escorted out to the vehicle to go to [facility] for admission for treatment of his infected foot wound.

## 2011-02-12 NOTE — Progress Notes (Signed)
Patient discharged with instructions to report to admissions department at [place] [facility].  Belongings retrieved from locker.  Patient left the unit via wheelchair with his parents.  He was escorted out to the vehicle to go to [facility] for admission for treatment of his infected foot wound.  

## 2011-02-12 NOTE — ED Notes (Signed)
[id number] Ready 

## 2011-02-12 NOTE — Progress Notes (Signed)
[facility] Case Management Discharge Plan:  Will you be returning to the same living situation after discharge: Yes,  home At discharge, do you have transportation home?:Yes,  parents Do you have the ability to pay for your medications:Yes,  insurance  Interagency Information:     Release of information consent forms completed and in the chart;  Patient's signature needed at discharge.  Patient to Follow up at:  Follow-up Information    Follow up with [facility] on 02/16/2011. (Monday 02/16/11 at 12:50 pm with Dr. Lia Hopping)    Contact information:   Phone- [phone] [address] [place], [place] [address]       Follow up with [facility] on 02/16/2011. (Monday 02/16/11 at 8:00am)    Contact information:   Phone- [phone] [address] [place], [address]         Patient denies SI/HI:   Yes,  yes    Safety Planning and Suicide Prevention discussed:  Yes,  yes  Barrier to discharge identified:No.  Summary and Recommendations:   [name] 02/12/2011, 11:30 AM

## 2011-02-12 NOTE — Progress Notes (Signed)
[facility] Group Notes:  (Counselor/Nursing/MHT/Case Management/Adjunct)  02/12/2011 12:43 PM  Type of Therapy:  Group Therapy at 11:00AM  Participation Level:  Did Not Attend  [name] 02/12/2011, 12:43 PM

## 2011-02-12 NOTE — Progress Notes (Signed)
ANTIBIOTIC CONSULT NOTE - INITIAL  Pharmacy Consult for Vancomycin / Zosyn Indication: Diabetic Foot Ulcer  No Known Allergies  Patient Measurements:   Weight: 71 kg  Vital Signs: Temp: 98.3 F (36.8 C) (01/24 1607) Temp src: Oral (01/24 1607) BP: 116/73 mmHg (01/24 1607) Pulse Rate: 76  (01/24 1607) Intake/Output from previous day:   Intake/Output from this shift:    Labs:  Basename 02/11/11 2344 02/09/11 1946  WBC 6.7 4.3  HGB 13.0 12.5*  PLT 99* 91*  LABCREA -- --  CREATININE 0.68 0.64   The CrCl is unknown because both a height and weight (above a minimum accepted value) are required for this calculation. No results found for this basename: VANCOTROUGH:2,VANCOPEAK:2,VANCORANDOM:2,GENTTROUGH:2,GENTPEAK:2,GENTRANDOM:2,TOBRATROUGH:2,TOBRAPEAK:2,TOBRARND:2,AMIKACINPEAK:2,AMIKACINTROU:2,AMIKACIN:2, in the last 72 hours   Microbiology: No results found for this or any previous visit (from the past 720 hour(s)).  Medical History: Past Medical History  Diagnosis Date  . Diabetic foot ulcer   . Diabetes mellitus   . Pneumonia     Medications:  Scheduled:    . sodium chloride   Intravenous STAT  . clindamycin (CLEOCIN) IV  900 mg Intravenous To Major  . enoxaparin  40 mg Subcutaneous Q24H  . nicotine      . ondansetron      . oxyCODONE-acetaminophen  2 tablet Oral Once  . sodium chloride  3 mL Intravenous Q12H   Assessment: 46 year old beginning empiric antibiotics for diabetic foot ulcer.  Scr normal  Goal of Therapy:  Vancomycin trough level 15-20 mcg/ml  Plan:  1) Zosyn 3.375 grams iv Q 8 hours - 4 hr infusion 2) Vancomycin 1 Gram IV q 12 hours 3) Follow up plan, cultures, renal function  Thank you.  Elwin Sleight 02/12/2011,5:26 PM

## 2011-02-13 MED ORDER — COLLAGENASE 250 UNIT/GM EX OINT
TOPICAL_OINTMENT | Freq: Every day | CUTANEOUS | Status: DC
  Administered 2011-02-13 – 2011-02-14 (×2): via TOPICAL
  Filled 2011-02-13: qty 30

## 2011-02-13 MED ORDER — ONDANSETRON HCL 4 MG/2ML IJ SOLN
4.0000 mg | Freq: Four times a day (QID) | INTRAMUSCULAR | Status: DC | PRN
  Administered 2011-02-13: 4 mg via INTRAVENOUS
  Filled 2011-02-13 (×2): qty 2

## 2011-02-13 MED ORDER — OXYCODONE HCL 5 MG PO TABS
5.0000 mg | ORAL_TABLET | ORAL | Status: DC | PRN
  Administered 2011-02-13 – 2011-02-14 (×3): 10 mg via ORAL
  Administered 2011-02-14 (×2): 5 mg via ORAL
  Administered 2011-02-14 – 2011-02-15 (×5): 10 mg via ORAL
  Filled 2011-02-13 (×10): qty 2

## 2011-02-13 MED ORDER — ZOLPIDEM TARTRATE 5 MG PO TABS
5.0000 mg | ORAL_TABLET | Freq: Every evening | ORAL | Status: DC | PRN
  Administered 2011-02-13 – 2011-02-15 (×2): 5 mg via ORAL
  Filled 2011-02-13 (×2): qty 1

## 2011-02-13 MED ORDER — NICOTINE 14 MG/24HR TD PT24
14.0000 mg | MEDICATED_PATCH | Freq: Every day | TRANSDERMAL | Status: DC
  Administered 2011-02-13 – 2011-02-14 (×2): 14 mg via TRANSDERMAL
  Filled 2011-02-13 (×3): qty 1

## 2011-02-13 NOTE — Progress Notes (Signed)
Utilization review complete 

## 2011-02-13 NOTE — Progress Notes (Signed)
Orthopedic Tech Progress Note Patient Details:  [name] 09-Jul-1965 [id number]  Other Ortho Devices Type of Ortho Device: Postop boot Ortho Device Location: Darko shoe to let foot Ortho Device Interventions: Application   Gaye Pollack 02/13/2011, 1:36 PM
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
## 2011-02-13 NOTE — Progress Notes (Signed)
Physical Therapy Evaluation Patient Details Name: [name] MRN: [id number] DOB: [birth year] Today's Date: 02/13/2011  Problem List:  Patient Active Problem List  Diagnoses  . Diabetes mellitus with foot ulcer    Past Medical History:  Past Medical History  Diagnosis Date  . Diabetic foot ulcer   . Diabetes mellitus   . Pneumonia    Past Surgical History:  Past Surgical History  Procedure Date  . Total hip arthroplasty   . Total knee arthroplasty   . Joint replacement   . Lung lobectomy     PT Assessment/Plan/Recommendation PT Assessment Clinical Impression Statement: Pt admitted from ETOH rehab for debridement of L neuropathic foot ulcur. Pt plans to go home from [facility]. Pt is WBAT LLE with darco shoe, but is also limited due to bil neuropathy and weakness/arthritis in R knee, which he reports gives out periodically. Pt will benefit from skilled PT to address below barriers and return home with reduced falls risk.  PT Recommendation/Assessment: Patient will need skilled PT in the acute care venue PT Problem List: Decreased strength;Decreased range of motion;Decreased mobility;Decreased balance;Decreased knowledge of use of DME;Impaired sensation;Pain;Decreased skin integrity Barriers to Discharge: None PT Therapy Diagnosis : Difficulty walking;Acute pain;Generalized weakness PT Plan PT Frequency: Min 3X/week PT Treatment/Interventions: DME instruction;Gait training;Stair training;Functional mobility training;Therapeutic activities;Therapeutic exercise;Balance training;Neuromuscular re-education;Patient/family education PT Recommendation Follow Up Recommendations: No PT follow up Equipment Recommended: None recommended by PT PT Goals  Acute Rehab PT Goals PT Goal Formulation: With patient Time For Goal Achievement: 7 days Pt will go Sit to Stand: with modified independence;with upper extremity assist PT Goal: Sit to Stand - Progress: Progressing toward goal Pt will go  Stand to Sit: with modified independence;with upper extremity assist PT Goal: Stand to Sit - Progress: Progressing toward goal Pt will Ambulate: >150 feet;with modified independence;with rolling walker PT Goal: Ambulate - Progress: Progressing toward goal Pt will Go Up / Down Stairs: 3-5 stairs;with supervision;with rail(s) PT Goal: Up/Down Stairs - Progress: Progressing toward goal  PT Evaluation Precautions/Restrictions  Precautions Precautions: Fall Restrictions Weight Bearing Restrictions: Yes LLE Weight Bearing: Weight bearing as tolerated Other Position/Activity Restrictions: Darco shoe Prior Functioning  Home Living Lives With: Significant other Receives Help From: Other (Comment) (girlfriend) Type of Home: Mobile home Home Layout: One level Home Access: Stairs to enter Entrance Stairs-Rails: Can reach both Entrance Stairs-Number of Steps: 5 Bathroom Shower/Tub: Health visitor: Standard Bathroom Accessibility: Yes How Accessible: Accessible via walker Home Adaptive Equipment: Bedside commode/3-in-1;Crutches;Reacher;Straight cane;Walker - rolling Prior Function Level of Independence: Requires assistive device for independence;Independent with transfers;Independent with gait;Independent with homemaking with ambulation (Uses SEC due to neurpoathy and R LE weakness/knee pain) Able to Take Stairs?: Yes Driving: Yes Vocation: On disability Cognition Cognition Arousal/Alertness: Awake/alert Overall Cognitive Status: Appears within functional limits for tasks assessed Sensation/Coordination Sensation Light Touch: Impaired Detail Light Touch Impaired Details: Absent RLE;Absent LLE (Absent to ankles) Proprioception: Impaired Detail Proprioception Impaired Details: Absent RLE (LLE NT due to wound, but likely same) Coordination Gross Motor Movements are Fluid and Coordinated: Yes Fine Motor Movements are Fluid and Coordinated: Yes Extremity Assessment RUE  Assessment RUE Assessment: Not tested LUE Assessment LUE Assessment: Not tested RLE Assessment RLE Assessment: Exceptions to [facility] RLE Strength RLE Overall Strength: Deficits RLE Overall Strength Comments: Grossly 3+/5 throughout with crepitus noted in knee. DF 4/5 LLE Assessment LLE Assessment: Within Functional Limits Mobility (including Balance) Bed Mobility Bed Mobility: Yes Supine to Sit: 6: Modified independent (Device/Increase time) Transfers Transfers: Yes  Sit to Stand: 5: Supervision;With upper extremity assist;From bed Sit to Stand Details (indicate cue type and reason): Safe hand placement noted Stand to Sit: 5: Supervision;With upper extremity assist;To bed Stand to Sit Details: Cues to bring RW throughout turn Ambulation/Gait Ambulation/Gait: Yes Ambulation/Gait Assistance: 4: Min assist Ambulation/Gait Assistance Details (indicate cue type and reason): Min A for steadying initially, progressing to S. Trialed both RW and SEC, and advised pt to use RW initially at home due to report of R knee buckling periodically in addition to darco shoe. Cues given for RW proximity and posture.  Ambulation Distance (Feet): 200 Feet (100' with RW, 100' with SEC) Assistive device: Rolling walker;Straight cane Gait Pattern: Step-through pattern;Decreased stride length;Trunk flexed;Decreased weight shift to left Stairs: Yes Stairs Assistance: 4: Min assist Stairs Assistance Details (indicate cue type and reason): Cues for step pattern with L leading up, R down due to very weak RLE. Pt turns sideways to descend in safe manner Stair Management Technique: Two rails;Step to pattern Number of Stairs: 5  Height of Stairs: 6   Posture/Postural Control Posture/Postural Control: No significant limitations Balance Balance Assessed: No   End of Session PT - End of Session Equipment Utilized During Treatment: Gait belt (L darco shoe) Activity Tolerance: Patient tolerated treatment well Patient  left: with call bell in reach;in bed (EOB) Nurse Communication: Mobility status for ambulation;Mobility status for transfers;Weight bearing status General Behavior During Session: [facility] for tasks performed Cognition: [facility] for tasks performed  [facility] [place], [place] [phone]  02/13/2011, 1:56 PM

## 2011-02-13 NOTE — Consult Note (Signed)
Reason for Consult:insensate neuropathic ulcer left foot Referring Physician: [name] is an 46 y.o. male.  HPI: in etoh rehab was walking on hard floor barefoot  Past Medical History  Diagnosis Date  . Diabetic foot ulcer   . Diabetes mellitus   . Pneumonia     Past Surgical History  Procedure Date  . Total hip arthroplasty   . Total knee arthroplasty   . Joint replacement   . Lung lobectomy     No family history on file.  Social History:  reports that he has been smoking.  He does not have any smokeless tobacco history on file. He reports that he drinks alcohol. He reports that he uses illicit drugs (Marijuana).  Allergies: No Known Allergies  Medications: I have reviewed the patient's current medications.  Results for orders placed during the hospital encounter of 02/12/11 (from the past 48 hour(s))  CBC     Status: Abnormal   Collection Time   02/12/11  4:59 PM      Component Value Range Comment   WBC 6.7  4.0 - 10.5 (K/uL)    RBC 4.65  4.22 - 5.81 (MIL/uL)    Hemoglobin 13.9  13.0 - 17.0 (g/dL)    HCT 78.2  95.6 - 21.3 (%)    MCV 91.6  78.0 - 100.0 (fL)    MCH 29.9  26.0 - 34.0 (pg)    MCHC 32.6  30.0 - 36.0 (g/dL)    RDW 08.6 (*) 57.8 - 15.5 (%)    Platelets 124 (*) 150 - 400 (K/uL)   CREATININE, SERUM     Status: Normal   Collection Time   02/12/11  4:59 PM      Component Value Range Comment   Creatinine, Ser 0.69  0.50 - 1.35 (mg/dL)    GFR calc non Af Amer >90  >90 (mL/min)    GFR calc Af Amer >90  >90 (mL/min)     [name] Wo Contrast  02/12/2011  *RADIOLOGY REPORT*  Clinical Data: Plantar foot ulcer.  MRI OF THE LEFT FOREFOOT WITHOUT AND WITH CONTRAST  Technique:  Multiplanar, multisequence MR imaging was performed both before and after administration of intravenous contrast.  Contrast: 15mL MULTIHANCE GADOBENATE DIMEGLUMINE 529 MG/ML IV SOLN  Comparison: None  Findings: There is a small ulceration on the plantar aspect of the  forefoot overlying the region of the first metatarsal head.  No discrete soft tissue abscess.  Mild surrounding cellulitis.  The medial forefoot and midfoot musculature demonstrates diffuse fatty infiltration and diffuse increased T2 signal intensity and enhancement suggesting myositis.  No focal soft tissue abscess.  No findings to suggest osteomyelitis.  The flexor and extensor tendons are intact.  No findings for septic tenosynovitis.  IMPRESSION:  1.  No MR findings to suggest osteomyelitis or septic arthritis and no soft tissue abscess. 2.  Plantar forefoot ulceration with mild surrounding cellulitis. 3.  Diffuse fatty infiltration of the foot musculature and myositis.  Original Report Authenticated By: P. Loralie Champagne, M.D.    Review of Systems  All other systems reviewed and are negative.   Blood pressure 101/74, pulse 78, temperature 97.4 F (36.3 C), temperature source Oral, resp. rate 15, height 6\' 3"  (1.905 m), weight 72.576 kg (160 lb), SpO2 100.00%. Physical Exam good pulses left foot with wagner grade 1 ulcer great toe MTP joint  Assessment/Plan: Wagner grade 1 ulcer left foot no osteomylitis, no abscess Ulcer debrided of skin and  soft tissue, start darco shoe, santyl drsg change daily, i'll f/u 1 week, continue IV abx and d/c sunday  [name] 02/13/2011, 11:31 AM
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
## 2011-02-14 NOTE — Progress Notes (Signed)
Refused Lovenox,  Stated that he's been getting OOB frequently

## 2011-02-14 NOTE — Progress Notes (Signed)
Handout on Diabetes and how to care for diabetic feet were placed in pt room. Will begin teaching in am.

## 2011-02-14 NOTE — Progress Notes (Signed)
Physical Therapy Treatment Patient Details Name: CALLOWAY ANDRUS MRN: [id number] DOB: 19-Nov-1965 Today's Date: 02/14/2011  PT Assessment/Plan  PT - Assessment/Plan Comments on Treatment Session: Patient did very well with ambulation on level surfaces and stairs.  Encouraged patient to use cane for balance at home.  Patient achieved all PT goals - no other acute PT needs identified.  PT will sign off. PT Plan: All goals met and education completed, patient dischaged from PT services Follow Up Recommendations: No PT follow up Equipment Recommended: None recommended by PT PT Goals  Acute Rehab PT Goals PT Goal: Sit to Stand - Progress: Met PT Goal: Stand to Sit - Progress: Met PT Goal: Ambulate - Progress: Met PT Goal: Up/Down Stairs - Progress: Met  PT Treatment Precautions/Restrictions  Precautions Precautions: Fall Required Braces or Orthoses: No Restrictions Weight Bearing Restrictions: Yes LLE Weight Bearing: Weight bearing as tolerated Other Position/Activity Restrictions: Darco shoe LLE Mobility (including Balance) Bed Mobility Bed Mobility: Yes Supine to Sit: 7: Independent;HOB flat Transfers Transfers: Yes Sit to Stand: 6: Modified independent (Device/Increase time);With upper extremity assist;From bed Stand to Sit: 6: Modified independent (Device/Increase time);With upper extremity assist;To bed Ambulation/Gait Ambulation/Gait: Yes Ambulation/Gait Assistance: 6: Modified independent (Device/Increase time) Ambulation/Gait Assistance Details (indicate cue type and reason): Balance improved today.  Patient using IV pole - encouraged patient to use cane at home. Ambulation Distance (Feet): 180 Feet Assistive device:  (Pushing IV pole) Gait Pattern: Step-through pattern;Decreased stride length;Decreased weight shift to left Gait velocity: Slow gait speed Stairs: Yes Stairs Assistance: 5: Supervision Stairs Assistance Details (indicate cue type and reason): Patient did not  need cuing - used safe correct sequence. Stair Management Technique: Two rails;Step to pattern;Forwards Number of Stairs: 4   Posture/Postural Control Posture/Postural Control: No significant limitations Exercise    End of Session PT - End of Session Equipment Utilized During Treatment: Gait belt (Darco shoe LLE) Activity Tolerance: Patient tolerated treatment well Patient left: in bed;with call bell in reach (sitting on edge of bed) Nurse Communication: Mobility status for ambulation General Behavior During Session: [facility] for tasks performed Cognition: [facility] for tasks performed  Vena Austria [phone] 02/14/2011, 3:58 PM

## 2011-02-15 DIAGNOSIS — F1011 Alcohol abuse, in remission: Secondary | ICD-10-CM | POA: Insufficient documentation

## 2011-02-15 DIAGNOSIS — L97509 Non-pressure chronic ulcer of other part of unspecified foot with unspecified severity: Secondary | ICD-10-CM | POA: Diagnosis present

## 2011-02-15 DIAGNOSIS — R7301 Impaired fasting glucose: Secondary | ICD-10-CM | POA: Diagnosis present

## 2011-02-15 DIAGNOSIS — L97521 Non-pressure chronic ulcer of other part of left foot limited to breakdown of skin: Secondary | ICD-10-CM | POA: Diagnosis present

## 2011-02-15 MED ORDER — OXYCODONE HCL 5 MG PO TABS: 5.0000 mg | ORAL_TABLET | ORAL | Status: AC | PRN

## 2011-02-15 MED ORDER — COLLAGENASE 250 UNIT/GM EX OINT: TOPICAL_OINTMENT | Freq: Every day | CUTANEOUS | Status: AC

## 2011-02-15 MED ORDER — CLINDAMYCIN HCL 300 MG PO CAPS: 300.0000 mg | ORAL_CAPSULE | Freq: Three times a day (TID) | ORAL | Status: AC

## 2011-02-15 NOTE — Discharge Summary (Signed)
Physician Discharge Summary Note  Patient:  [name] is an 46 y.o., male MRN:  [id number] DOB:  08-30-1965 Patient phone:  [phone] (home)  Patient address:   [address] [place] [address],   Date of Admission:  02/08/2011 Date of Discharge:  02/12/2011  Discharge Diagnoses: alcohol dependency                                             Active Problems:  borderline diabetes  neuropathic foot ulcer  Axis Diagnosis:   AXIS I:  alcohol dependency AXIS II:  Deferred AXIS III:   Past Medical History  Diagnosis Date  . Diabetic foot ulcer   . Diabetes mellitus   . Pneumonia    AXIS IV:  problems related to social environment, problems with access to health care services and problems with primary support group AXIS V:  41-50 serious symptoms  Level of Care:  OP Hospital Course:      The duration of  Lyrick"s stay at [facility] was unremarkable.      The patient was seen and evaluated by the Treatment team consisting of Psychiatrist, PAC, RN, Case Manager, and Therapist for evaluation and treatment plan with goal of stabilization upon discharge. The patient's physical and mental health problems were identified and treated appropriately.      Multiple modalities of treatment were used including medication, individual and group therapies, unit programming, AA/NA, improved nutrition, physical activity, and family sessions as needed.     The symptoms of alcohol/substance abuse withdrawal were monitored daily by serial clinical withdrawal scores. Improvement was demonstrated by declining CIWA/COWS numbers, improving vital signs, increased cognition, and improvement in mood, sleep, appetite as well as a reduction in psychosocial symptoms.         On the third day of the patient's stay he was found to have a foot ulcer to the Left great metatarsal head worrisome for osteomyelitis.  An MRI, CBC with Diff, Sed Rate and Hepatic panel were ordered and an Orthopedic consult was requested. On  the fourth day Dr. Carola Frost reviewed the labs and MRI and felt that the patient would be better served by IV antibiotics and surgical debridement.       The patient was evaluated and found to be stable enough for discharge and was released to go to [facility] for Orthopedic care.   Mental Status Exam:  For mental status exam please see mental status exam and  suicide risk assessment completed by attending physician prior to discharge.    Consults:  orthopedic surgery  Significant Diagnostic Studies:  labs:  and radiology: MRI:   Discharge Vitals:   Blood pressure 96/65, pulse 111, temperature 97.9 F (36.6 C), temperature source Oral, resp. rate 18, height 6\' 3"  (1.905 m), weight 160 lb (72.576 kg).  Mental Status Exam: See Mental Status Examination and Suicide Risk Assessment completed by Attending Physician prior to discharge.  Discharge destination:  Other:  [facility]  Is patient on multiple antipsychotic therapies at discharge:  No   Has Patient had three or more failed trials of antipsychotic monotherapy by history:  No  Recommended Plan for Multiple Antipsychotic Therapies: N/A  Medication List    Notice       You have not been prescribed any medications.            Follow-up  Information    Follow up with [facility] on 02/16/2011. (Monday 02/16/11 at 12:50 pm with Dr. Lia Hopping)    Contact information:   Phone- [phone] [address], [address] [place], [place] [address]       Follow up with [facility] on 02/16/2011. (Monday 02/16/11 at 8:00am)    Contact information:   Phone- [phone] [address] [place], [address]         Follow-up recommendations:  It is recommended that you go directly to the Admissions office at [facility] directly upon discharge from [facility]  Keep all weight bearing off of your foot.   Rona Ravens. Solange Emry PAC For Dr. Gurney Maxin Signed: Joscelin Fray 02/15/2011, 1:29 AM
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
## 2011-02-15 NOTE — Progress Notes (Signed)
ANTIBIOTIC CONSULT NOTE - INITIAL  Pharmacy Consult for Vancomycin / Zosyn Indication: Diabetic Foot Ulcer  Allergies  Allergen Reactions  . Benadryl (Diphenhydramine Hcl) Other (See Comments)    Pt states leg spasm    Patient Measurements: Height: 6\' 3"  (190.5 cm) Weight: 160 lb (72.576 kg) IBW/kg (Calculated) : 84.5  Weight: 71 kg  Vital Signs: Temp: 98.6 F (37 C) (01/27 0547) Temp src: Oral (01/27 0547) BP: 124/78 mmHg (01/27 0547) Pulse Rate: 73  (01/27 0547) Intake/Output from previous day: 01/26 0701 - 01/27 0700 In: 3055 [P.O.:1200; IV Piggyback:1855] Out: -  Intake/Output from this shift:    Labs:  [facility] 02/12/11 1659  WBC 6.7  HGB 13.9  PLT 124*  LABCREA --  CREATININE 0.69   Estimated Creatinine Clearance: 117.2 ml/min (by C-G formula based on Cr of 0.69). No results found for this basename: VANCOTROUGH:2,VANCOPEAK:2,VANCORANDOM:2,GENTTROUGH:2,GENTPEAK:2,GENTRANDOM:2,TOBRATROUGH:2,TOBRAPEAK:2,TOBRARND:2,AMIKACINPEAK:2,AMIKACINTROU:2,AMIKACIN:2, in the last 72 hours   Microbiology: No results found for this or any previous visit (from the past 720 hour(s)).  Medical History: Past Medical History  Diagnosis Date  . Diabetic foot ulcer   . Diabetes mellitus   . Pneumonia     Medications:  Scheduled:     . collagenase   Topical Daily  . enoxaparin  40 mg Subcutaneous Q24H  . nicotine  14 mg Transdermal QHS  . piperacillin-tazobactam (ZOSYN)  IV  3.375 g Intravenous Q8H  . sodium chloride  3 mL Intravenous Q12H  . vancomycin  1,000 mg Intravenous Q12H   Assessment: 46 year old beginning empiric antibiotics for diabetic foot ulcer.  S/p I&D. On vanc/zosyn D4. Ortho rec dc abx. Renal function stable.   Goal of Therapy:  Vancomycin trough level 15-20 mcg/ml  Plan:  1) Zosyn 3.375 grams iv Q 8 hours - 4 hr infusion 2) Vancomycin 1 Gram IV q 12 hours 3) Consider dc abx or change to PO  Thank you.  [facility] 02/15/2011,8:45  AM

## 2011-02-16 NOTE — Progress Notes (Signed)
MD questioned patient status as observation, was sent to EHR  Who determined patient to be appropriate for inpatient status.

## 2011-03-20 NOTE — Progress Notes (Signed)
Attempted to mail letter for court to patient address  [address].  [name], [place] [address]  Was returned today, undeliverable.  [name] 03/20/2011 2:33 PM

## 2011-04-19 ENCOUNTER — Encounter (HOSPITAL_COMMUNITY): Payer: Self-pay | Admitting: *Deleted

## 2011-04-19 ENCOUNTER — Emergency Department (HOSPITAL_COMMUNITY): Payer: Medicare Other

## 2011-04-19 ENCOUNTER — Emergency Department (HOSPITAL_COMMUNITY)
Admission: EM | Admit: 2011-04-19 | Discharge: 2011-04-19 | Disposition: A | Discharge status: Home / Self Care | Payer: Medicare Other | Attending: Emergency Medicine | Admitting: Emergency Medicine

## 2011-04-19 DIAGNOSIS — X500XXA Overexertion from strenuous movement or load, initial encounter: Secondary | ICD-10-CM | POA: Insufficient documentation

## 2011-04-19 DIAGNOSIS — E119 Type 2 diabetes mellitus without complications: Secondary | ICD-10-CM | POA: Insufficient documentation

## 2011-04-19 DIAGNOSIS — M25579 Pain in unspecified ankle and joints of unspecified foot: Secondary | ICD-10-CM | POA: Insufficient documentation

## 2011-04-19 DIAGNOSIS — M25569 Pain in unspecified knee: Secondary | ICD-10-CM | POA: Insufficient documentation

## 2011-04-19 DIAGNOSIS — Z9889 Other specified postprocedural states: Secondary | ICD-10-CM | POA: Insufficient documentation

## 2011-04-19 DIAGNOSIS — M25469 Effusion, unspecified knee: Secondary | ICD-10-CM | POA: Insufficient documentation

## 2011-04-19 DIAGNOSIS — S93409A Sprain of unspecified ligament of unspecified ankle, initial encounter: Secondary | ICD-10-CM | POA: Insufficient documentation

## 2011-04-19 DIAGNOSIS — M25461 Effusion, right knee: Secondary | ICD-10-CM

## 2011-04-19 LAB — SYNOVIAL CELL COUNT + DIFF, W/ CRYSTALS
Eosinophils-Synovial: 0 % (ref 0–1)
Other Cells-SYN: 1

## 2011-04-19 MED ORDER — OXYCODONE-ACETAMINOPHEN 5-325 MG PO TABS
1.0000 | ORAL_TABLET | Freq: Once | ORAL | Status: AC
  Administered 2011-04-19: 1 via ORAL
  Filled 2011-04-19: qty 1

## 2011-04-19 MED ORDER — OXYCODONE-ACETAMINOPHEN 10-325 MG PO TABS: 1.0000 | ORAL_TABLET | ORAL | Status: AC | PRN

## 2011-04-19 MED ORDER — IBUPROFEN 800 MG PO TABS: 800.0000 mg | ORAL_TABLET | Freq: Three times a day (TID) | ORAL | Status: AC

## 2011-04-19 MED ORDER — BUPIVACAINE HCL (PF) 0.5 % IJ SOLN
20.0000 mL | Freq: Once | INTRAMUSCULAR | Status: AC
  Administered 2011-04-19: 16:00:00
  Filled 2011-04-19: qty 30

## 2011-04-19 NOTE — Discharge Instructions (Signed)
Knee Effusion The medical term for having fluid in your knee is effusion. This is often due to an internal derangement of the knee. This means something is wrong inside the knee. Some of the causes of fluid in the knee may be torn cartilage, a torn ligament, or bleeding into the joint from an injury. Your knee is likely more difficult to bend and move. This is often because there is increased pain and pressure in the joint. The time it takes for recovery from a knee effusion depends on different factors, including:   Type of injury.   Your age.   Physical and medical conditions.   Rehabilitation Strategies.  How long you will be away from your normal activities will depend on what kind of knee problem you have and how much damage is present. Your knee has two types of cartilage. Articular cartilage covers the bone ends and lets your knee bend and move smoothly. Two menisci, thick pads of cartilage that form a rim inside the joint, help absorb shock and stabilize your knee. Ligaments bind the bones together and support your knee joint. Muscles move the joint, help support your knee, and take stress off the joint itself. CAUSES  Often an effusion in the knee is caused by an injury to one of the menisci. This is often a tear in the cartilage. Recovery after a meniscus injury depends on how much meniscus is damaged and whether you have damaged other knee tissue. Small tears may heal on their own with conservative treatment. Conservative means rest, limited weight bearing activity and muscle strengthening exercises. Your recovery may take up to 6 weeks.  TREATMENT  Larger tears may require surgery. Meniscus injuries may be treated during arthroscopy. Arthroscopy is a procedure in which your surgeon uses a small telescope like instrument to look in your knee. Your caregiver can make a more accurate diagnosis (learning what is wrong) by performing an arthroscopic procedure. If your injury is on the inner  margin of the meniscus, your surgeon may trim the meniscus back to a smooth rim. In other cases your surgeon will try to repair a damaged meniscus with stitches (sutures). This may make rehabilitation take longer, but may provide better long term result by helping your knee keep its shock absorption capabilities. Ligaments which are completely torn usually require surgery for repair. HOME CARE INSTRUCTIONS  Use crutches as instructed.   If a brace is applied, use as directed.   Once you are home, an ice pack applied to your swollen knee may help with discomfort and help decrease swelling.   Keep your knee raised (elevated) when you are not up and around or on crutches.   Only take over-the-counter or prescription medicines for pain, discomfort, or fever as directed by your caregiver.   Your caregivers will help with instructions for rehabilitation of your knee. This often includes strengthening exercises.   You may resume a normal diet and activities as directed.  SEEK MEDICAL CARE IF:   There is increased swelling in your knee.   You notice redness, swelling, or increasing pain in your knee.   An unexplained oral temperature above 102 F (38.9 C) develops.  SEEK IMMEDIATE MEDICAL CARE IF:   You develop a rash.   You have difficulty breathing.   You have any allergic reactions from medications you may have been given.   There is severe pain with any motion of the knee.  MAKE SURE YOU:   Understand these instructions.  Will watch your condition.   Will get help right away if you are not doing well or get worse.  Document Released: 03/28/2003 Document Revised: 12/25/2010 Document Reviewed: 06/01/2007 [facility] Patient Information 2012 [place], [place].Ankle Sprain An ankle sprain happens when the bands of tissue that hold the ankle bones together (ligaments) stretch too much and tear.  HOME CARE   Put ice on the ankle for 15 to 20 minutes, 3 to 4 times a day.   Put ice in  a plastic bag.   Place a towel between your skin and the bag.   You may stop icing when the puffiness (swelling) goes down.   Raise (elevate) the injured ankle to lessen puffiness.   Use crutches if your doctor tells you to. Use them until you can walk without pain.   If a plaster splint was applied:   Rest the plaster splint on nothing harder than a pillow for 24 hours.   Do not put weight on it.   Do not get it wet.   Take it off to shower or bathe.   Follow up with your doctor.   Use an elastic wrap for support. Take the wrap off if the toes lose feeling (numb), tingle, or turn cold or blue.   If an air splint was applied:   Add or release air to make it comfortable.   Take it off at night and to shower and bathe.   Wiggle your toes while wearing the air splint.   Only take medicine as told by your doctor.   Do not drive until your doctor says it is okay.  GET HELP RIGHT AWAY IF:   You have more bruising, puffiness, or pain.   Your toes feel cold.   Your medicine does not help lessen your pain.   You are losing feeling in your toes or they turn blue.   You have severe pain.  MAKE SURE YOU:   Understand these instructions.   Will watch your condition.   Will get help right away if you are not doing well or get worse.  Document Released: 06/24/2007 Document Revised: 12/25/2010 Document Reviewed: 06/24/2007 [facility] Patient Information 2012 [place], [place].    Ice and elevation may help your swelling as discussed.  Wear the knee immobilizer you have at home and minimize weight bearing.  Use caution with the oxycodone - this will make you drowsy - do not drive within 4 hours of taking this medicine.  You will be notified if your joint fluid results have any abnormal findings.  Otherwise,  Plan to keep your appointment with Dr. Lajoyce Corners as scheduled.

## 2011-04-19 NOTE — ED Notes (Signed)
Reports fell up steps this morning and twisted left ankle.  C/o pain to right knee.  Denies hitting head.

## 2011-04-19 NOTE — ED Notes (Signed)
Pt seen by MD prior to my assessment. 

## 2011-04-22 NOTE — ED Provider Notes (Signed)
History     CSN: [id number]  Arrival date & time 04/19/11  1348   First MD Initiated Contact with Patient 04/19/11 1446      Chief Complaint  Patient presents with  . Knee Pain  . Ankle Pain    (Consider location/radiation/quality/duration/timing/severity/associated sxs/prior treatment) HPI Comments: Pt presents with increased pain and swelling in his right knee after tripping and turning his leftankle after tripping on a step this morning.  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  He is followed by Dr. Lajoyce Corners.  Patient is a 46 y.o. male presenting with fall. The history is provided by the patient.  Fall The fall occurred while walking. He landed on carpet. There was no blood loss. The point of impact was the right knee (left ankle.  He did not hit his head and denies any other injuiry.). The pain is at a severity of 8/10. The pain is moderate. He was ambulatory at the scene. Pertinent negatives include no numbness, no headaches, no loss of consciousness and no tingling. The symptoms are aggravated by standing and ambulation.    Past Medical History  Diagnosis Date  . Diabetic foot ulcer   . Diabetes mellitus   . Pneumonia     Past Surgical History  Procedure Date  . Total hip arthroplasty   . Total knee arthroplasty   . Joint replacement   . Lung lobectomy     No family history on file.  History  Substance Use Topics  . Smoking status: Current Everyday Smoker -- 2.0 packs/day  . Smokeless tobacco: Not on file  . Alcohol Use: No      Review of Systems  Constitutional: Negative.   HENT: Negative for sore throat and neck pain.   Eyes: Negative.   Respiratory: Negative for shortness of breath.   Cardiovascular: Negative for chest pain.  Musculoskeletal: Positive for joint swelling and arthralgias.  Skin: Negative.  Negative for rash and wound.  Neurological: Negative for tingling, loss of  consciousness, weakness, numbness and headaches.    Allergies  Benadryl  Home Medications   Current Outpatient Rx  Name Route Sig Dispense Refill  . IBUPROFEN 800 MG PO TABS Oral Take 1 tablet (800 mg total) by mouth 3 (three) times daily. 21 tablet 0  . OXYCODONE-ACETAMINOPHEN 10-325 MG PO TABS Oral Take 1 tablet by mouth every 4 (four) hours as needed for pain. 30 tablet 0    BP 132/85  Pulse 90  Temp(Src) 98.1 F (36.7 C) (Oral)  Resp 20  Ht 6\' 3"  (1.905 m)  Wt 170 lb (77.111 kg)  BMI 21.25 kg/m2  SpO2 100%  Physical Exam  Nursing note and vitals reviewed. Constitutional: He is oriented to person, place, and time. He appears well-developed and well-nourished.  HENT:  Head: Normocephalic.  Eyes: Conjunctivae are normal.  Neck: Normal range of motion.  Cardiovascular: Normal rate and intact distal pulses.  Exam reveals no decreased pulses.   Pulses:      Dorsalis pedis pulses are 2+ on the right side, and 2+ on the left side.       Posterior tibial pulses are 2+ on the right side, and 2+ on the left side.  Pulmonary/Chest: Effort normal.  Musculoskeletal: He exhibits edema and tenderness.       Right knee: He exhibits effusion. He exhibits no ecchymosis, no deformity and no erythema. tenderness found.  Left ankle: He exhibits no swelling, no ecchymosis and normal pulse. tenderness. Lateral malleolus tenderness found. Achilles tendon normal.  Neurological: He is alert and oriented to person, place, and time. No sensory deficit.  Skin: Skin is warm, dry and intact.    ED Course  ARTHOCENTESIS Performed by: Danique Hartsough L Authorized by: Candis Musa Consent: Verbal consent obtained. Risks and benefits: risks, benefits and alternatives were discussed Consent given by: patient Time out: Immediately prior to procedure a "time out" was called to verify the correct patient, procedure, equipment, support staff and site/side marked as required. Indications: joint  swelling  Body area: knee Joint: right knee Local anesthesia used: yes Anesthesia: local infiltration Local anesthetic: bupivacaine 0.5% without epinephrine Anesthetic total: 4 ml Preparation: Patient was prepped and draped in the usual sterile fashion. Needle gauge: 18 G Approach: lateral Aspirate: bloody and yellow Aspirate amount: 55 ml Patient tolerance: Patient tolerated the procedure well with no immediate complications.   Aspirate yellow,  Became blood tinged with last 10 mL obtained.  (including critical care time)  Labs Reviewed  CELL COUNT + DIFF,  W/ CRYST-SYNVL FLD - Abnormal; Notable for the following:    Color, Synovial RED (*)    Appearance-Synovial CLOUDY (*)    WBC, Synovial 4200 (*)    Lymphocytes-Synovial Fld 64 (*)    Monocyte-Macrophage-Synovial Fluid 23 (*)    All other components within normal limits  BODY FLUID CULTURE   No results found.   1. Ankle sprain   2. Effusion of right knee joint       MDM  Pt has knee immobilizer at home,  Prefers cane to crutches,  Also has.  Aso applied to left ankle.  RICE.  F/u Dr. Lajoyce Corners prn.  No organisms seen on gram stain.  Effusion inflammatory reaction.        Candis Musa, PA 04/22/11 1205

## 2011-04-23 LAB — BODY FLUID CULTURE: Special Requests: NORMAL

## 2011-04-23 NOTE — ED Provider Notes (Signed)
Medical screening examination/treatment/procedure(s) were performed by non-physician practitioner and as supervising physician I was immediately available for consultation/collaboration.  Shelda Jakes, MD 04/23/11 [phone]

## 2011-05-21 ENCOUNTER — Emergency Department (HOSPITAL_COMMUNITY)
Admission: EM | Admit: 2011-05-21 | Discharge: 2011-05-21 | Disposition: A | Discharge status: Home / Self Care | Payer: Medicare Other | Attending: Emergency Medicine | Admitting: Emergency Medicine

## 2011-05-21 ENCOUNTER — Encounter (HOSPITAL_COMMUNITY): Payer: Self-pay | Admitting: Emergency Medicine

## 2011-05-21 ENCOUNTER — Emergency Department (HOSPITAL_COMMUNITY): Payer: Medicare Other

## 2011-05-21 DIAGNOSIS — E119 Type 2 diabetes mellitus without complications: Secondary | ICD-10-CM | POA: Insufficient documentation

## 2011-05-21 DIAGNOSIS — X500XXA Overexertion from strenuous movement or load, initial encounter: Secondary | ICD-10-CM | POA: Insufficient documentation

## 2011-05-21 DIAGNOSIS — M25569 Pain in unspecified knee: Secondary | ICD-10-CM | POA: Insufficient documentation

## 2011-05-21 DIAGNOSIS — F17200 Nicotine dependence, unspecified, uncomplicated: Secondary | ICD-10-CM | POA: Insufficient documentation

## 2011-05-21 MED ORDER — NAPROXEN 500 MG PO TABS: 500.0000 mg | ORAL_TABLET | Freq: Two times a day (BID) | ORAL | Status: DC

## 2011-05-21 MED ORDER — OXYCODONE-ACETAMINOPHEN 5-325 MG PO TABS: 1.0000 | ORAL_TABLET | ORAL | Status: DC | PRN

## 2011-05-21 NOTE — Discharge Instructions (Signed)
Knee Pain The knee is the complex joint between your thigh and your lower leg. It is made up of bones, tendons, ligaments, and cartilage. The bones that make up the knee are:  The femur in the thigh.   The tibia and fibula in the lower leg.   The patella or kneecap riding in the groove on the lower femur.  CAUSES  Knee pain is a common complaint with many causes. A few of these causes are:  Injury, such as:   A ruptured ligament or tendon injury.   Torn cartilage.   Medical conditions, such as:   Gout   Arthritis   Infections   Overuse, over training or overdoing a physical activity.  Knee pain can be minor or severe. Knee pain can accompany debilitating injury. Minor knee problems often respond well to self-care measures or get well on their own. More serious injuries may need medical intervention or even surgery. SYMPTOMS The knee is complex. Symptoms of knee problems can vary widely. Some of the problems are:  Pain with movement and weight bearing.   Swelling and tenderness.   Buckling of the knee.   Inability to straighten or extend your knee.   Your knee locks and you cannot straighten it.   Warmth and redness with pain and fever.   Deformity or dislocation of the kneecap.  DIAGNOSIS  Determining what is wrong may be very straight forward such as when there is an injury. It can also be challenging because of the complexity of the knee. Tests to make a diagnosis may include:  Your caregiver taking a history and doing a physical exam.   Routine X-rays can be used to rule out other problems. X-rays will not reveal a cartilage tear. Some injuries of the knee can be diagnosed by:   Arthroscopy a surgical technique by which a small video camera is inserted through tiny incisions on the sides of the knee. This procedure is used to examine and repair internal knee joint problems. Tiny instruments can be used during arthroscopy to repair the torn knee cartilage  (meniscus).   Arthrography is a radiology technique. A contrast liquid is directly injected into the knee joint. Internal structures of the knee joint then become visible on X-ray film.   An MRI scan is a non x-ray radiology procedure in which magnetic fields and a computer produce two- or three-dimensional images of the inside of the knee. Cartilage tears are often visible using an MRI scanner. MRI scans have largely replaced arthrography in diagnosing cartilage tears of the knee.   Blood work.   Examination of the fluid that helps to lubricate the knee joint (synovial fluid). This is done by taking a sample out using a needle and a syringe.  TREATMENT The treatment of knee problems depends on the cause. Some of these treatments are:  Depending on the injury, proper casting, splinting, surgery or physical therapy care will be needed.   Give yourself adequate recovery time. Do not overuse your joints. If you begin to get sore during workout routines, back off. Slow down or do fewer repetitions.   For repetitive activities such as cycling or running, maintain your strength and nutrition.   Alternate muscle groups. For example if you are a weight lifter, work the upper body on one day and the lower body the next.   Either tight or weak muscles do not give the proper support for your knee. Tight or weak muscles do not absorb the stress placed   on the knee joint. Keep the muscles surrounding the knee strong.   Take care of mechanical problems.   If you have flat feet, orthotics or special shoes may help. See your caregiver if you need help.   Arch supports, sometimes with wedges on the inner or outer aspect of the heel, can help. These can shift pressure away from the side of the knee most bothered by osteoarthritis.   A brace called an "unloader" brace also may be used to help ease the pressure on the most arthritic side of the knee.   If your caregiver has prescribed crutches, braces,  wraps or ice, use as directed. The acronym for this is PRICE. This means protection, rest, ice, compression and elevation.   Nonsteroidal anti-inflammatory drugs (NSAID's), can help relieve pain. But if taken immediately after an injury, they may actually increase swelling. Take NSAID's with food in your stomach. Stop them if you develop stomach problems. Do not take these if you have a history of ulcers, stomach pain or bleeding from the bowel. Do not take without your caregiver's approval if you have problems with fluid retention, heart failure, or kidney problems.   For ongoing knee problems, physical therapy may be helpful.   Glucosamine and chondroitin are over-the-counter dietary supplements. Both may help relieve the pain of osteoarthritis in the knee. These medicines are different from the usual anti-inflammatory drugs. Glucosamine may decrease the rate of cartilage destruction.   Injections of a corticosteroid drug into your knee joint may help reduce the symptoms of an arthritis flare-up. They may provide pain relief that lasts a few months. You may have to wait a few months between injections. The injections do have a small increased risk of infection, water retention and elevated blood sugar levels.   Hyaluronic acid injected into damaged joints may ease pain and provide lubrication. These injections may work by reducing inflammation. A series of shots may give relief for as long as 6 months.   Topical painkillers. Applying certain ointments to your skin may help relieve the pain and stiffness of osteoarthritis. Ask your pharmacist for suggestions. Many over the-counter products are approved for temporary relief of arthritis pain.   In some countries, doctors often prescribe topical NSAID's for relief of chronic conditions such as arthritis and tendinitis. A review of treatment with NSAID creams found that they worked as well as oral medications but without the serious side effects.    PREVENTION  Maintain a healthy weight. Extra pounds put more strain on your joints.   Get strong, stay limber. Weak muscles are a common cause of knee injuries. Stretching is important. Include flexibility exercises in your workouts.   Be smart about exercise. If you have osteoarthritis, chronic knee pain or recurring injuries, you may need to change the way you exercise. This does not mean you have to stop being active. If your knees ache after jogging or playing basketball, consider switching to swimming, water aerobics or other low-impact activities, at least for a few days a week. Sometimes limiting high-impact activities will provide relief.   Make sure your shoes fit well. Choose footwear that is right for your sport.   Protect your knees. Use the proper gear for knee-sensitive activities. Use kneepads when playing volleyball or laying carpet. Buckle your seat belt every time you drive. Most shattered kneecaps occur in car accidents.   Rest when you are tired.  SEEK MEDICAL CARE IF:  You have knee pain that is continual and does not   seem to be getting better.  SEEK IMMEDIATE MEDICAL CARE IF:  Your knee joint feels hot to the touch and you have a high fever. MAKE SURE YOU:   Understand these instructions.   Will watch your condition.   Will get help right away if you are not doing well or get worse.  Document Released: 11/02/2006 Document Revised: 12/25/2010 Document Reviewed: 11/02/2006 [employer] Patient Information 2012 [employer]. 

## 2011-05-21 NOTE — ED Notes (Signed)
Pt seen by EDPa for initial assessment. 

## 2011-05-21 NOTE — ED Provider Notes (Signed)
History     CSN: [id number]  Arrival date & time 05/21/11  1703   First MD Initiated Contact with Patient 05/21/11 1721      Chief Complaint  Patient presents with  . Knee Pain    (Consider location/radiation/quality/duration/timing/severity/associated sxs/prior treatment) HPI Comments: Patient with hx of chronic right knee pain states he slipped and twisting his knee earlier today and the pain has gotten worse.  States he sees Dr. Lajoyce Corners for his knee pain but does not have an appt until May 25.  States pain is similar to the previous knee pain he had in March when he was seen here.  Also states that he has ran out of his pain medication.  He denies numbness, calf pain or hip pain  Patient is a 46 y.o. male presenting with knee pain. The history is provided by the patient.  Knee Pain This is a chronic problem. The current episode started today. The problem occurs constantly. The problem has been unchanged. Associated symptoms include arthralgias and joint swelling. Pertinent negatives include no chills, fever, neck pain, numbness, rash or weakness. The symptoms are aggravated by walking, standing and twisting. He has tried NSAIDs for the symptoms. The treatment provided no relief.    Past Medical History  Diagnosis Date  . Diabetic foot ulcer   . Diabetes mellitus   . Pneumonia     Past Surgical History  Procedure Date  . Total hip arthroplasty   . Total knee arthroplasty   . Joint replacement   . Lung lobectomy     No family history on file.  History  Substance Use Topics  . Smoking status: Current Everyday Smoker -- 2.0 packs/day  . Smokeless tobacco: Not on file  . Alcohol Use: No      Review of Systems  Constitutional: Negative for fever and chills.  HENT: Negative for neck pain.   Musculoskeletal: Positive for joint swelling and arthralgias.  Skin: Negative for color change and rash.  Neurological: Negative for weakness and numbness.  All other systems reviewed  and are negative.    Allergies  Benadryl  Home Medications  No current outpatient prescriptions on file.  BP 132/77  Pulse 103  Temp(Src) 97.4 F (36.3 C) (Oral)  Resp 16  Ht 6\' 3"  (1.905 m)  Wt 180 lb (81.647 kg)  BMI 22.50 kg/m2  SpO2 99%  Physical Exam  Nursing note and vitals reviewed. Constitutional: He is oriented to person, place, and time. He appears well-developed and well-nourished. No distress.  HENT:  Head: Normocephalic and atraumatic.  Cardiovascular: Normal rate, regular rhythm and normal heart sounds.   Pulmonary/Chest: Effort normal and breath sounds normal.  Musculoskeletal: He exhibits tenderness.       Right knee: He exhibits effusion and bony tenderness. He exhibits normal range of motion, no ecchymosis, no deformity, no laceration and no erythema. tenderness found. Medial joint line and lateral joint line tenderness noted. No patellar tendon tenderness noted.       Legs:      ttp along the medial and lateral aspects of the right knee.  Mild effusion, no obvious ligament instability, erythema, excessive warmth or deformity. Pt has full ROM of the knee, pain reproduced with flexion  Neurological: He is alert and oriented to person, place, and time. He exhibits normal muscle tone. Coordination normal.  Skin: Skin is warm and dry.    ED Course  Procedures (including critical care time)  Labs Reviewed - No data to display Dg  Knee Complete 4 Views Right  05/21/2011  *RADIOLOGY REPORT*  Clinical Data: Twisted right knee, pain  RIGHT KNEE - COMPLETE 4+ VIEW  Comparison: 04/19/2011  Findings: Osteoarthritic changes greatest at lateral compartment where the greatest degree of joint space narrowing and spur formation is identified. Small knee joint effusion. No acute fracture or dislocation. Osseous mineralization grossly normal.  IMPRESSION: Degenerative changes right knee with small knee joint effusion. No acute osseous abnormalities.  Original Report Authenticated  By: Lollie Marrow, M.D.       MDM    Patient has knee immobilizer at home, using a cane and prefers the cane to crutches.  Small effusion present on imaging, no erythema, excessive warmth.  Doubtful of septic joint.  Followed by Dr. Lajoyce Corners.  HAs appt later this month   Patient / Family / Caregiver understand and agree with initial ED impression and plan with expectations set for ED visit. Pt stable in ED with no significant deterioration in condition.      Kortnie Stovall L. [place], [place] 05/22/11 [phone]

## 2011-05-21 NOTE — ED Notes (Signed)
Pt c/o right knee pain after twisting it.

## 2011-05-24 NOTE — ED Provider Notes (Signed)
Medical screening examination/treatment/procedure(s) were performed by non-physician practitioner and as supervising physician I was immediately available for consultation/collaboration.  Latrina Guttman, MD 05/24/11 0912 

## 2011-05-30 ENCOUNTER — Encounter (HOSPITAL_COMMUNITY): Payer: Self-pay | Admitting: *Deleted

## 2011-05-30 ENCOUNTER — Emergency Department (HOSPITAL_COMMUNITY): Payer: Medicare Other

## 2011-05-30 ENCOUNTER — Emergency Department (HOSPITAL_COMMUNITY)
Admission: EM | Admit: 2011-05-30 | Discharge: 2011-05-31 | Disposition: A | Discharge status: Other Acute Inpatient Hospital | Payer: Medicare Other | Source: Home / Self Care | Attending: Emergency Medicine | Admitting: Emergency Medicine

## 2011-05-30 DIAGNOSIS — E119 Type 2 diabetes mellitus without complications: Secondary | ICD-10-CM | POA: Insufficient documentation

## 2011-05-30 DIAGNOSIS — M25519 Pain in unspecified shoulder: Secondary | ICD-10-CM | POA: Insufficient documentation

## 2011-05-30 DIAGNOSIS — F1010 Alcohol abuse, uncomplicated: Secondary | ICD-10-CM

## 2011-05-30 DIAGNOSIS — M21619 Bunion of unspecified foot: Secondary | ICD-10-CM | POA: Insufficient documentation

## 2011-05-30 DIAGNOSIS — M25579 Pain in unspecified ankle and joints of unspecified foot: Secondary | ICD-10-CM | POA: Insufficient documentation

## 2011-05-30 DIAGNOSIS — S40029A Contusion of unspecified upper arm, initial encounter: Secondary | ICD-10-CM | POA: Insufficient documentation

## 2011-05-30 DIAGNOSIS — M25569 Pain in unspecified knee: Secondary | ICD-10-CM | POA: Insufficient documentation

## 2011-05-30 DIAGNOSIS — Z8701 Personal history of pneumonia (recurrent): Secondary | ICD-10-CM | POA: Insufficient documentation

## 2011-05-30 DIAGNOSIS — F17200 Nicotine dependence, unspecified, uncomplicated: Secondary | ICD-10-CM | POA: Insufficient documentation

## 2011-05-30 LAB — BASIC METABOLIC PANEL WITH GFR
BUN: 5 mg/dL — ABNORMAL LOW (ref 6–23)
CO2: 21 meq/L (ref 19–32)
Calcium: 9 mg/dL (ref 8.4–10.5)
Chloride: 89 meq/L — ABNORMAL LOW (ref 96–112)
Creatinine, Ser: 0.65 mg/dL (ref 0.50–1.35)
GFR calc Af Amer: >90 mL/min
GFR calc non Af Amer: >90 mL/min
Glucose, Bld: 97 mg/dL (ref 70–99)
Potassium: 3.2 meq/L — ABNORMAL LOW (ref 3.5–5.1)
Sodium: 134 meq/L — ABNORMAL LOW (ref 135–145)

## 2011-05-30 LAB — DIFFERENTIAL
Basophils Absolute: 0.1 10*3/uL (ref 0.0–0.1)
Basophils Relative: 1 % (ref 0–1)
Eosinophils Absolute: 0 10*3/uL (ref 0.0–0.7)
Eosinophils Relative: 1 % (ref 0–5)
Lymphocytes Relative: 30 % (ref 12–46)
Lymphs Abs: 2.1 10*3/uL (ref 0.7–4.0)
Monocytes Absolute: 0.5 10*3/uL (ref 0.1–1.0)
Monocytes Relative: 7 % (ref 3–12)
Neutro Abs: 4.5 10*3/uL (ref 1.7–7.7)
Neutrophils Relative %: 63 % (ref 43–77)

## 2011-05-30 LAB — URINALYSIS, ROUTINE W REFLEX MICROSCOPIC
Bilirubin Urine: NEGATIVE
Glucose, UA: NEGATIVE mg/dL
Leukocytes, UA: NEGATIVE
Protein, ur: NEGATIVE mg/dL

## 2011-05-30 LAB — URINE MICROSCOPIC-ADD ON

## 2011-05-30 LAB — CBC
Hemoglobin: 14.9 g/dL (ref 13.0–17.0)
RBC: 5.34 MIL/uL (ref 4.22–5.81)
WBC: 7.3 10*3/uL (ref 4.0–10.5)

## 2011-05-30 LAB — RAPID URINE DRUG SCREEN, HOSP PERFORMED
Amphetamines: NOT DETECTED
Barbiturates: NOT DETECTED
Benzodiazepines: NOT DETECTED
Cocaine: POSITIVE — AB

## 2011-05-30 LAB — ETHANOL: Alcohol, Ethyl (B): 274 mg/dL — ABNORMAL HIGH (ref 0–11)

## 2011-05-30 MED ORDER — LORAZEPAM 1 MG PO TABS
1.0000 mg | ORAL_TABLET | Freq: Once | ORAL | Status: AC
  Administered 2011-05-30: 1 mg via ORAL
  Filled 2011-05-30: qty 1

## 2011-05-30 MED ORDER — IBUPROFEN 800 MG PO TABS
800.0000 mg | ORAL_TABLET | Freq: Once | ORAL | Status: AC
  Administered 2011-05-30: 800 mg via ORAL
  Filled 2011-05-30: qty 1

## 2011-05-30 MED ORDER — NICOTINE 14 MG/24HR TD PT24
14.0000 mg | MEDICATED_PATCH | Freq: Once | TRANSDERMAL | Status: DC
  Administered 2011-05-30: 14 mg via TRANSDERMAL
  Filled 2011-05-30: qty 1

## 2011-05-30 MED ORDER — CHLORDIAZEPOXIDE HCL 25 MG PO CAPS
25.0000 mg | ORAL_CAPSULE | Freq: Once | ORAL | Status: AC
  Administered 2011-05-30: 25 mg via ORAL
  Filled 2011-05-30: qty 1
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
## 2011-05-30 NOTE — ED Notes (Signed)
Pt alert and cooperative.  No distress noted. Pt denies needs at present.   ACT team at bedside to evaluate pt.
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
## 2011-05-30 NOTE — ED Notes (Signed)
Pt somewhat more agitated and not wanting to say in bed.  Dr Adriana Simas aware, orders received for nicotine patch and librium.  Explained to pt that for his safety and privacy of other pt's, it is requested he remain in bed.  Pt agreeable at this time.
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
## 2011-05-31 ENCOUNTER — Encounter (HOSPITAL_COMMUNITY): Payer: Self-pay | Admitting: *Deleted

## 2011-05-31 ENCOUNTER — Inpatient Hospital Stay (HOSPITAL_COMMUNITY)
Admission: AD | Admit: 2011-05-31 | Discharge: 2011-06-03 | DRG: 897 | Disposition: A | Discharge status: Home / Self Care | Payer: Medicare Other | Source: Other Acute Inpatient Hospital | Attending: Psychiatry | Admitting: Psychiatry

## 2011-05-31 DIAGNOSIS — F1020 Alcohol dependence, uncomplicated: Secondary | ICD-10-CM | POA: Diagnosis present

## 2011-05-31 DIAGNOSIS — Z96649 Presence of unspecified artificial hip joint: Secondary | ICD-10-CM

## 2011-05-31 DIAGNOSIS — F10239 Alcohol dependence with withdrawal, unspecified: Principal | ICD-10-CM | POA: Diagnosis present

## 2011-05-31 DIAGNOSIS — F1010 Alcohol abuse, uncomplicated: Secondary | ICD-10-CM

## 2011-05-31 DIAGNOSIS — L97509 Non-pressure chronic ulcer of other part of unspecified foot with unspecified severity: Secondary | ICD-10-CM | POA: Diagnosis present

## 2011-05-31 DIAGNOSIS — F10939 Alcohol use, unspecified with withdrawal, unspecified: Principal | ICD-10-CM | POA: Diagnosis present

## 2011-05-31 DIAGNOSIS — F1910 Other psychoactive substance abuse, uncomplicated: Secondary | ICD-10-CM | POA: Diagnosis present

## 2011-05-31 DIAGNOSIS — F1410 Cocaine abuse, uncomplicated: Secondary | ICD-10-CM | POA: Diagnosis present

## 2011-05-31 DIAGNOSIS — M1990 Unspecified osteoarthritis, unspecified site: Secondary | ICD-10-CM | POA: Diagnosis present

## 2011-05-31 DIAGNOSIS — F10988 Alcohol use, unspecified with other alcohol-induced disorder: Secondary | ICD-10-CM | POA: Diagnosis present

## 2011-05-31 DIAGNOSIS — Z96659 Presence of unspecified artificial knee joint: Secondary | ICD-10-CM

## 2011-05-31 DIAGNOSIS — E1169 Type 2 diabetes mellitus with other specified complication: Secondary | ICD-10-CM | POA: Diagnosis present

## 2011-05-31 MED ORDER — MAGNESIUM HYDROXIDE 400 MG/5ML PO SUSP: 30.0000 mL | Freq: Every day | ORAL | Status: DC | PRN

## 2011-05-31 MED ORDER — ACETAMINOPHEN 325 MG PO TABS
650.0000 mg | ORAL_TABLET | Freq: Four times a day (QID) | ORAL | Status: DC | PRN
  Administered 2011-05-31 – 2011-06-01 (×5): 650 mg via ORAL

## 2011-05-31 MED ORDER — LORAZEPAM 1 MG PO TABS
1.0000 mg | ORAL_TABLET | Freq: Four times a day (QID) | ORAL | Status: DC | PRN
  Administered 2011-05-31: 1 mg via ORAL
  Filled 2011-05-31: qty 1

## 2011-05-31 MED ORDER — THIAMINE HCL 100 MG/ML IJ SOLN
100.0000 mg | Freq: Once | INTRAMUSCULAR | Status: AC
  Administered 2011-05-31: 100 mg via INTRAMUSCULAR

## 2011-05-31 MED ORDER — ONDANSETRON 4 MG PO TBDP: 4.0000 mg | ORAL_TABLET | Freq: Four times a day (QID) | ORAL | Status: AC | PRN

## 2011-05-31 MED ORDER — FOLIC ACID 1 MG PO TABS: 1.0000 mg | ORAL_TABLET | Freq: Every day | ORAL | Status: DC

## 2011-05-31 MED ORDER — ADULT MULTIVITAMIN W/MINERALS CH: 1.0000 | ORAL_TABLET | Freq: Every day | ORAL | Status: DC

## 2011-05-31 MED ORDER — HYDROXYZINE HCL 25 MG PO TABS
25.0000 mg | ORAL_TABLET | Freq: Four times a day (QID) | ORAL | Status: DC | PRN
  Administered 2011-05-31: 25 mg via ORAL

## 2011-05-31 MED ORDER — VITAMIN B-1 100 MG PO TABS: 100.0000 mg | ORAL_TABLET | Freq: Every day | ORAL | Status: DC

## 2011-05-31 MED ORDER — ALUM & MAG HYDROXIDE-SIMETH 200-200-20 MG/5ML PO SUSP: 30.0000 mL | ORAL | Status: DC | PRN

## 2011-05-31 MED ORDER — CHLORDIAZEPOXIDE HCL 25 MG PO CAPS
25.0000 mg | ORAL_CAPSULE | Freq: Four times a day (QID) | ORAL | Status: DC | PRN
  Administered 2011-05-31 – 2011-06-01 (×5): 25 mg via ORAL
  Filled 2011-05-31 (×5): qty 1

## 2011-05-31 MED ORDER — LORAZEPAM 2 MG/ML IJ SOLN: 1.0000 mg | Freq: Four times a day (QID) | INTRAMUSCULAR | Status: DC | PRN

## 2011-05-31 MED ORDER — NICOTINE 21 MG/24HR TD PT24
21.0000 mg | MEDICATED_PATCH | Freq: Every day | TRANSDERMAL | Status: DC
  Administered 2011-05-31 – 2011-06-03 (×4): 21 mg via TRANSDERMAL
  Filled 2011-05-31 (×7): qty 1

## 2011-05-31 MED ORDER — THIAMINE HCL 100 MG/ML IJ SOLN: 100.0000 mg | Freq: Every day | INTRAMUSCULAR | Status: DC

## 2011-05-31 MED ORDER — ACETAMINOPHEN 325 MG PO TABS: 650.0000 mg | ORAL_TABLET | Freq: Four times a day (QID) | ORAL | Status: DC | PRN

## 2011-05-31 MED ORDER — IBUPROFEN 400 MG PO TABS
400.0000 mg | ORAL_TABLET | Freq: Once | ORAL | Status: AC
  Administered 2011-05-31: 400 mg via ORAL
  Filled 2011-05-31: qty 1

## 2011-05-31 MED ORDER — TRAZODONE HCL 50 MG PO TABS
50.0000 mg | ORAL_TABLET | Freq: Every evening | ORAL | Status: DC | PRN
  Administered 2011-05-31: 50 mg via ORAL
  Filled 2011-05-31: qty 1

## 2011-05-31 MED ORDER — VITAMIN B-1 100 MG PO TABS
100.0000 mg | ORAL_TABLET | Freq: Every day | ORAL | Status: DC
  Administered 2011-06-01 – 2011-06-03 (×3): 100 mg via ORAL
  Filled 2011-05-31 (×5): qty 1

## 2011-05-31 MED ORDER — LOPERAMIDE HCL 2 MG PO CAPS: 2.0000 mg | ORAL_CAPSULE | ORAL | Status: AC | PRN

## 2011-05-31 MED ORDER — HALOPERIDOL 5 MG PO TABS
5.0000 mg | ORAL_TABLET | Freq: Once | ORAL | Status: AC
  Administered 2011-05-31: 5 mg via ORAL
  Filled 2011-05-31: qty 1

## 2011-05-31 MED ORDER — ADULT MULTIVITAMIN W/MINERALS CH
1.0000 | ORAL_TABLET | Freq: Every day | ORAL | Status: DC
  Administered 2011-05-31 – 2011-06-03 (×4): 1 via ORAL
  Filled 2011-05-31 (×6): qty 1

## 2011-05-31 MED ORDER — CHLORDIAZEPOXIDE HCL 25 MG PO CAPS
25.0000 mg | ORAL_CAPSULE | Freq: Once | ORAL | Status: AC
  Administered 2011-05-31: 25 mg via ORAL
  Filled 2011-05-31: qty 1

## 2011-05-31 NOTE — [facility] Suicide Risk Assessment (Signed)
Suicide Risk Assessment  Admission Assessment     Demographic factors:  Assessment Details Time of Assessment: Admission Information Obtained From: Patient Current Mental Status:   see below Loss Factors:  Loss Factors: Legal issues,disabled Historical Factors:   no si attemtos Risk Reduction Factors:  Risk Reduction Factors: Sense of responsibility to family  CLINICAL FACTORS:   Alcohol/Substance Abuse/Dependencies  COGNITIVE FEATURES THAT CONTRIBUTE TO RISK:  Closed-mindedness    SUICIDE RISK:   Mild:  Suicidal ideation of limited frequency, intensity, duration, and specificity.  There are no identifiable plans, no associated intent, mild dysphoria and related symptoms, good self-control (both objective and subjective assessment), few other risk factors, and identifiable protective factors, including available and accessible social support.  PLAN OF CARE:   Mental Status Examination/Evaluation:  Objective: Appearance: Fairly Groomed looks much older than age   Psychomotor Activity: Mannerisms walks with a cane L knee replacement and R hip replacement   Eye Contact: Good   Speech: Normal Rate   Volume: Normal   Mood: Anxious - active withdrawal   Affect: Congruent   Thought Process: Clear rational goal oriented   Orientation:full   Thought Content: No AVH/psychosis   Suicidal Thoughts: No   Homicidal Thoughts: No   Judgement: poor  Insight: poor  DIAGNOSIS:  AXIS I  alchol Abuse,  Cocaine abuse   AXIS II  Deferred   AXIS III  See medical history.   AXIS IV  began drinking again a few weeks ago   AXIS V  11-20 some danger of hurting self or others possible OR occasionally fails to maintain minimal personal hygiene OR gross impairment in communication   Treatment Plan Summary:  Admit for safety & stabilization  Medically assist through alcohol withdrawal with Librium protocol  Podiatric consult to be considered later   Wonda Cerise 05/31/2011, 7:03 PM

## 2011-05-31 NOTE — Progress Notes (Signed)
[name] Adult Inpatient Family/Significant Other Suicide Prevention Education  Suicide Prevention Education:  Patient Refusal for Family/Significant Other Suicide Prevention Education: The patient [name] has refused to provide written consent for family/significant other to be provided Family/Significant Other Suicide Prevention Education during admission and/or prior to discharge.  Physician notified.  Pt. accepted information on suicide prevention, warning signs to look for with suicide and crisis line numbers to use. The pt. agreed to call crisis line numbers if having warning signs or having thoughts of suicide.    [facility] 05/31/2011, 4:58 PM

## 2011-05-31 NOTE — ED Notes (Signed)
Pt remains in department as [facility] doesn't have available truck to transport at this time.

## 2011-05-31 NOTE — Progress Notes (Addendum)
Patient ID: [name], male   DOB: [birth year], 45 y.o.   MRN: [id number] 05-31-11 nursing admission note:  Pt came to [facility] voluntarily requesting detox from etoh. He has been here at this facility before. He stated his last drink of etoh was on 05-30-11. He also stated he has been using cocaine, his last use was 05-29-11. He was having w/d symptoms of anxiety, tremor and some sweating. He stated he is allergic to benadryl, wear glasses, and had left knee and right knee pain he rated at 8/10. He uses a cane and needed his shoes with strings due to a fall risk. An order was obtained for these items. He denied any si/hi/av. He has a medical hx of a lobectomy in his left lung in 2005.  He supports himself with disability and lives with his mother. He has a 46 year old son. His uds was positive for cocaine and his etoh on 05-30-11 was 274. computer entry on this adm was completed by shalita,rn.   He was polite/cooperative on adm and was escorted to the 300 hall.   Emergency contact: Jana Half at home # [phone]

## 2011-05-31 NOTE — ED Notes (Signed)
Pt awoke, complaining of generalized aching pain and headache.  Pt mildly anxious.

## 2011-05-31 NOTE — ED Notes (Signed)
Pt resting quietly with eyes closed.  Respirations, regular even and unlabored.  Continuing to wait on [facility] for transport to [facility].
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
## 2011-05-31 NOTE — ED Notes (Signed)
Meal tray given. nad 

## 2011-05-31 NOTE — Tx Team (Signed)
Initial Interdisciplinary Treatment Plan  PATIENT STRENGTHS: (choose at least two) Ability for insight General fund of knowledge  PATIENT STRESSORS: Legal issue Substance abuse   PROBLEM LIST: Problem List/Patient Goals Date to be addressed Date deferred Reason deferred Estimated date of resolution  Substance abuse 05/31/11           Depression  05/31/11                                          DISCHARGE CRITERIA:  Improved stabilization in mood, thinking, and/or behavior  PRELIMINARY DISCHARGE PLAN: Return to previous living arrangement  PATIENT/FAMIILY INVOLVEMENT: This treatment plan has been presented to and reviewed with the patient, GLENN GULLICKSON.  The patient and family have been given the opportunity to ask questions and make suggestions.  Jacquelyne Balint Shanta 05/31/2011, 9:34 AM

## 2011-05-31 NOTE — Progress Notes (Signed)
Pt pleasant on approach, positive for evening group.  Denies complaints.  Minimal withdrawal symptoms.  Interacting appropriately within milieu.  Support and encouragement offered, will continue to monitor.

## 2011-05-31 NOTE — H&P (Signed)
  Pt was seen by me today and I agree with the key elements documented in H&P.  

## 2011-05-31 NOTE — Progress Notes (Signed)
Adult Psychosocial Assessment Update Interdisciplinary Team  [facility] admissions[facility]discharges:  Admissions Discharges  Date: 02/08/11 Date: 02/12/11  Date: Date:  Date: Date:  Date: Date:  Date: Date:   Changes since the last Psychosocial Assessment (including adherence to outpatient mental health and/or substance abuse treatment, situational issues contributing to decompensation and/or relapse). Pt. Started a bing 3 wks. ago.  Pt. went back to same environment and infulences   Pt. Was partying and drinking too much.           Discharge Plan 1. Will you be returning to the same living situation after discharge?   Yes:  Yes No:      If no, what is your plan?    Pt. Lives alone and will be returning back       2. Would you like a referral for services when you are discharged? Yes:     If yes, for what services?  No:        [facility] and Recommendations (to be completed by the evaluator) Pt. Is a 46 yr. Old male.  Recommendations for treatment include crisis stabilization  Case mgmt. Medication mgmt., psyco education to teach coping skills and group  therapy                   Signature:  Rhunette Croft, 05/31/2011 4:25 PM

## 2011-05-31 NOTE — Tx Team (Signed)
Initial Interdisciplinary Treatment Plan  PATIENT STRENGTHS: (choose at least two) Average or above average intelligence Motivation for treatment/growth Supportive family/friends  PATIENT STRESSORS: Legal issue Substance abuse   PROBLEM LIST: Problem List/Patient Goals Date to be addressed Date deferred Reason deferred Estimated date of resolution  etoh dependence/abuse  05-31-10           Legal issue-Pending dui  05-31-11                                          DISCHARGE CRITERIA:  Improved stabilization in mood, thinking, and/or behavior Verbal commitment to aftercare and medication compliance Withdrawal symptoms are absent or subacute and managed without 24-hour nursing intervention  PRELIMINARY DISCHARGE PLAN: Attend 12-step recovery group Return to previous living arrangement  PATIENT/FAMIILY INVOLVEMENT: This treatment plan has been presented to and reviewed with the patient, [name], and/or family member, .  The patient and family have been given the opportunity to ask questions and make suggestions.  Valente David 05/31/2011, 6:06 PM

## 2011-06-01 DIAGNOSIS — F1994 Other psychoactive substance use, unspecified with psychoactive substance-induced mood disorder: Secondary | ICD-10-CM

## 2011-06-01 DIAGNOSIS — F1020 Alcohol dependence, uncomplicated: Secondary | ICD-10-CM

## 2011-06-01 MED ORDER — CHLORDIAZEPOXIDE HCL 25 MG PO CAPS
25.0000 mg | ORAL_CAPSULE | Freq: Three times a day (TID) | ORAL | Status: DC
  Administered 2011-06-03 (×2): 25 mg via ORAL
  Filled 2011-06-01 (×3): qty 1

## 2011-06-01 MED ORDER — TRAZODONE HCL 100 MG PO TABS
100.0000 mg | ORAL_TABLET | Freq: Every day | ORAL | Status: DC
  Administered 2011-06-01: 100 mg via ORAL
  Filled 2011-06-01 (×3): qty 1

## 2011-06-01 MED ORDER — CHLORDIAZEPOXIDE HCL 25 MG PO CAPS: 25.0000 mg | ORAL_CAPSULE | ORAL | Status: DC

## 2011-06-01 MED ORDER — CHLORDIAZEPOXIDE HCL 25 MG PO CAPS: 25.0000 mg | ORAL_CAPSULE | Freq: Every day | ORAL | Status: DC

## 2011-06-01 MED ORDER — CHLORDIAZEPOXIDE HCL 25 MG PO CAPS
25.0000 mg | ORAL_CAPSULE | Freq: Four times a day (QID) | ORAL | Status: AC
  Administered 2011-06-01 – 2011-06-02 (×6): 25 mg via ORAL
  Filled 2011-06-01 (×5): qty 1

## 2011-06-01 MED ORDER — IBUPROFEN 800 MG PO TABS
800.0000 mg | ORAL_TABLET | Freq: Three times a day (TID) | ORAL | Status: DC | PRN
  Administered 2011-06-01 – 2011-06-03 (×4): 800 mg via ORAL
  Filled 2011-06-01 (×5): qty 1

## 2011-06-01 NOTE — Progress Notes (Signed)
Patient ID: [name], male   DOB: 1965-06-03, 46 y.o.   MRN: [id number] He has been up and about and to groups, interacting with peers and staff. Has requested and received medication for anxiety and for c/o chronic pain in his joints. Denies thoughts of SI.

## 2011-06-01 NOTE — Progress Notes (Signed)
[facility] MD Progress Note  06/01/2011 6:30 PM   S/O: Patient seen and evaluated. Chart reviewed. Patient stated that his mood was "not good". His affect was mood congruent and anxious. Sig w/d s/s reported.  He denied any current thoughts of self injurious behavior, suicidal ideation or homicidal ideation. There were no auditory or visual hallucinations, paranoia, delusional thought processes, or mania noted.  Thought process was linear and goal directed.  No psychomotor agitation or retardation was noted. His speech was normal rate, tone and volume. Eye contact was good. Judgment and insight are fair.  Patient has been up and engaged on the unit.  No acute safety concerns reported from team.  Sleep:  Number of Hours: 4    Vital Signs:Blood pressure 121/78, pulse 105, temperature 98 F (36.7 C), temperature source Oral, resp. rate 24, height 6\' 3"  (1.905 m), weight 77.111 kg (170 lb).  Lab Results:  Results for orders placed during the hospital encounter of 05/30/11 (from the past 48 hour(s))  URINE RAPID DRUG SCREEN (HOSP PERFORMED)     Status: Abnormal   Collection Time   05/30/11  7:19 PM      Component Value Range Comment   Opiates NONE DETECTED  NONE DETECTED     Cocaine POSITIVE (*) NONE DETECTED     Benzodiazepines NONE DETECTED  NONE DETECTED     Amphetamines NONE DETECTED  NONE DETECTED     Tetrahydrocannabinol NONE DETECTED  NONE DETECTED     Barbiturates NONE DETECTED  NONE DETECTED    URINALYSIS, ROUTINE W REFLEX MICROSCOPIC     Status: Abnormal   Collection Time   05/30/11  7:19 PM      Component Value Range Comment   Color, Urine YELLOW  YELLOW     APPearance CLEAR  CLEAR     Specific Gravity, Urine 1.020  1.005 - 1.030     pH 5.5  5.0 - 8.0     Glucose, UA NEGATIVE  NEGATIVE (mg/dL)    Hgb urine dipstick TRACE (*) NEGATIVE     Bilirubin Urine NEGATIVE  NEGATIVE     Ketones, ur TRACE (*) NEGATIVE (mg/dL)    Protein, ur NEGATIVE  NEGATIVE (mg/dL)    Urobilinogen, UA 0.2  0.0  - 1.0 (mg/dL)    Nitrite NEGATIVE  NEGATIVE     Leukocytes, UA NEGATIVE  NEGATIVE    URINE MICROSCOPIC-ADD ON     Status: Normal   Collection Time   05/30/11  7:19 PM      Component Value Range Comment   Squamous Epithelial / LPF RARE  RARE     WBC, UA 0-2  <3 (WBC/hpf)    RBC / HPF 0-2  <3 (RBC/hpf)    Bacteria, UA RARE  RARE      Physical Findings: CIWA:  CIWA-Ar Total: 3   A/P: Alcohol Dependence & W/D; Cocaine Abuse; SIMD  Initiated librium taper for reported w/d s/s and trazodone for sleep.  Medication education completed.  Pros, cons, risks, potential side effects and benefits were discussed with pt.  Pt agreeable with the plan.  See orders.  Discussed with team.   Lupe Carney 06/01/2011, 6:30 PM

## 2011-06-01 NOTE — H&P (Signed)
Cosigned for Dr. Rasul. 

## 2011-06-01 NOTE — Progress Notes (Signed)
[facility] Group Notes:  (Counselor/Nursing/MHT/Case Management/Adjunct)  06/01/2011 3:50 PM  Type of Therapy:  Group Therapy at 11:00  Participation Level:  Minimal  Participation Quality:  Attentive  Affect:  Appropriate  Cognitive:  Appropriate   Insight:  None shared  Engagement in Group:  Limited  Modes of Intervention:  Problem solving and support  Summary of Progress/Problems:   Group discussion focused on what patient's see as their own obstacles to recovery. [name] was attentive yet did not share.  [name] 06/01/2011, 3:57 PM  [facility] Group Notes:  (Counselor/Nursing/MHT/Case Management/Adjunct)  06/01/2011 3:57 PM  Type of Therapy:  Group Therapy at 1:15  Participation Level:  Minimal  Participation Quality:  Appropriate  Affect:  Appropriate  Cognitive:  Alert, Appropriate and Oriented  Engagement in Group:  Good  Modes of Intervention:  Education and Socialization  Summary of Progress/Problems:  Patient attended group presentation by Interior and spatial designer of  [employer] ([employer]). [name] was attentive and appropriate during session.     [name] 06/01/2011, 3:59 PM

## 2011-06-01 NOTE — Progress Notes (Signed)
Pt. Denies SI/HI and denies A/V hallucinations.  Attended group this evening.  Interacts appropriately with peers.   Support given.

## 2011-06-01 NOTE — Discharge Planning (Signed)
[name] attended AM group.  States when he is done with withdrawal he will return home and get back into AA.

## 2011-06-02 MED ORDER — TRAZODONE HCL 100 MG PO TABS
100.0000 mg | ORAL_TABLET | Freq: Once | ORAL | Status: AC
  Administered 2011-06-02: 100 mg via ORAL
  Filled 2011-06-02: qty 1

## 2011-06-02 MED ORDER — TRAZODONE HCL 100 MG PO TABS
200.0000 mg | ORAL_TABLET | Freq: Every day | ORAL | Status: DC
  Administered 2011-06-02: 200 mg via ORAL
  Filled 2011-06-02: qty 14
  Filled 2011-06-02 (×2): qty 2

## 2011-06-02 NOTE — Progress Notes (Signed)
[facility] Group Notes:  (Counselor/Nursing/MHT/Case Management/Adjunct)  06/02/2011 12:54 PM  Type of Therapy:  Group Therapy  Participation Level:  Minimal  Participation Quality:  Attentive  Affect:  Flat  Cognitive:  Alert and Oriented  Insight:  Limited  Engagement in Group:  Limited  Engagement in Therapy:  Unknown  Modes of Intervention:  Clarification, Socialization and Support  Summary of Progress/Problems:  Rodrecus was quietly attentive to group discussion on feelings regarding diagnosis; he did share he feels closer to acceptance than either denial, bargaining, anger or depression.  "I guess I just needed to prove it to myself once again that there are some people I cannot spend my time with."   Clide Dales 06/02/2011, 12:59 PM

## 2011-06-02 NOTE — Progress Notes (Signed)
06/02/2011         Time: 1415      Group Topic/Focus: The focus of this group is on discussing various styles of communication and communicating assertively using 'I' (feeling) statements.  Participation Level: Active  Participation Quality: Attentive  Affect: Appropriate  Cognitive: Oriented   Additional Comments: Patient reports he is looking forward to discharge today or tomorrow.  Brittanny Levenhagen 06/02/2011 3:05 PM

## 2011-06-02 NOTE — Progress Notes (Signed)
Patient c/o difficulty falling asleep, one time dose trazodone ordered and given. Patient verbalized sleeping "on and off." Patient denies SI/HI, also denies A/V hallucinations. Patient given support and encouragement. Patient remains safe on the unit, Q15 minute checks continue. Will continue to monitor.

## 2011-06-02 NOTE — Treatment Plan (Signed)
Interdisciplinary Treatment Plan Update (Adult)  Date: 06/02/2011  Time Reviewed: 12:48 PM   Progress in Treatment: Attending groups: Yes Participating in groups: Yes Taking medication as prescribed: Yes Tolerating medication: Yes   Family/Significant othe contact made:   Patient understands diagnosis:  Yes As evidenced by asking for help with detox from alcohol Discussing patient identified problems/goals with staff:  Yes  See below Medical problems stabilized or resolved:  Yes Denies suicidal/homicidal ideation: Yes In tx team Issues/concerns per patient self-inventory:  None noted Other:  New problem(s) identified: N/A  Reason for Continuation of Hospitalization: Medication stabilization Withdrawal symptoms  Interventions implemented related to continuation of hospitalization: Librium taper  Additional comments:  Estimated length of stay: 1-2 days  Discharge Plan:Return home, follow up outpt  New goal(s): N/A  Review of initial/current patient goals per problem list:   1.  Goal(s): Safely detox from alcohol  Met:  No  Target date:5/15  As evidenced JX:BJYN score of 0, stable vitals  2.  Goal (s): Identify comprehensive sobriety plan  Met:  No  Target date:5/15  As evidenced WG:NFAO report  3.  Goal(s):  Met:  No  Target date:  As evidenced by:  4.  Goal(s):  Met:  No  Target date:  As evidenced by:  Attendees: Patient:  [name] 5/14/201312:48 PM   Family:     Physician:  Lupe Carney 06/02/2011 12:48 PM   Nursing: Robbie Louis   06/02/2011 12:48 PM   Case Manager:  Richelle Ito, LCSW 06/02/2011 12:48 PM   Counselor:  Ronda Fairly, LCSWA 06/02/2011 12:48 PM   Other:     Other:     Other:     Other:      Scribe for Treatment Team:   Ida Rogue, 06/02/2011 12:48 PM

## 2011-06-02 NOTE — Progress Notes (Signed)
Patient reported doing well, he denied SI/HI and denied Hallucinations. His mood and affects flat and depressed, answered most question with yes/no. Denied any withdrawal symptom. He received his medications without any difficulty and voiced no complaint. Q 15 minute check continues to maintain safety.

## 2011-06-02 NOTE — Progress Notes (Signed)
[facility] MD Progress Note  06/02/2011 2:01 PM   S/O: Patient seen and evaluated in team. Chart reviewed. Patient stated that his mood was "not good" and he did not sleep at all with 100mg  of trazodone. His affect was mood congruent and anxious. Sig w/d s/s still reported, yet improved on librium taper.  He denied any current thoughts of self injurious behavior, suicidal ideation or homicidal ideation. There were no auditory or visual hallucinations, paranoia, delusional thought processes, or mania noted.  Thought process was linear and goal directed.  No psychomotor agitation or retardation was noted. His speech was normal rate, tone and volume. Eye contact was good. Judgment and insight are fair.  Patient has been up and engaged on the unit.  No acute safety concerns reported from team.  Sleep:  "minimal"   Vital Signs:Blood pressure 130/89, pulse 92, temperature 98 F (36.7 C), temperature source Oral, resp. rate 16, height 6\' 3"  (1.905 m), weight 77.111 kg (170 lb).  Lab Results:  No results found for this or any previous visit (from the past 48 hour(s)).  Physical Findings: CIWA:  CIWA-Ar Total: 0   A/P: Alcohol Dependence & W/D; Cocaine Abuse; SIMD  Initiated librium taper for reported w/d s/s and trazodone for sleep yesterday.  Will increase trazodone to 200mg  tonight. Medication education completed.  Pros, cons, risks, potential side effects and benefits were discussed with pt.  Pt agreeable with the plan.  See orders.  He has also been requesting discharge, will reevaluate in am.  Discussed with team.   Lupe Carney 06/02/2011, 2:01 PM

## 2011-06-02 NOTE — Progress Notes (Signed)
Patient ID: [name], male   DOB: [birth year], 46 y.o.   MRN: [id number] He has been up and about and to groups interacting with peers and staff. Has refused to fill out self inventory form today.  Has received prn for c/o leg pain at 11:56.

## 2011-06-03 MED ORDER — TRAZODONE HCL 100 MG PO TABS: 200.0000 mg | ORAL_TABLET | Freq: Every day | ORAL | Status: DC

## 2011-06-03 NOTE — Progress Notes (Signed)
Pt was up and down earlier in the night, c/o of R ankle pain and not being able to sleep.  Scheduled Trazodone 200mg  was given at bedtime.  Pt was also given Ibuprofen 800mg  at 0027 after explaining that he did not have anything else ordered, and Ibuprofen was helpful for inflammation which he was complaining about.  He also has been given ice packs for his ankle.  Pt slept for a little over an hour, then awoke around 2AM again c/o of pain and wanting something for sleep.  Explained that there was nothing else ordered to give him.  Pt grumbled and reluctantly returned to his room.  He has been asleep since 0230.  Safety maintained with q15 minute checks.

## 2011-06-03 NOTE — Progress Notes (Signed)
Patient ID: [name], male   DOB: 1965/05/23, 46 y.o.   MRN: [id number] Pt has been discharged home. He voiced understanding of discharge instruction.  He denies thoughts of harming self. All belonging taken home with him.

## 2011-06-03 NOTE — Treatment Plan (Signed)
Interdisciplinary Treatment Plan Update (Adult)  Date: 06/03/2011  Time Reviewed: 10:20 AM   Progress in Treatment: Attending groups: Yes Participating in groups: Yes Taking medication as prescribed: Yes Tolerating medication: Yes   Family/Significant othe contact made:   Patient understands diagnosis:  Yes Discussing patient identified problems/goals with staff:  Yes Medical problems stabilized or resolved:  Yes Denies suicidal/homicidal ideation: Yes  In tx team Issues/concerns per patient self-inventory:  None Other:  New problem(s) identified: N/A  Reason for Continuation of Hospitalization: Other; describe D/C today  Interventions implemented related to continuation of hospitalization:   Additional comments:  Estimated length of stay:D/C today  Discharge Plan:Return home, see below  New goal(s): N/A  Review of initial/current patient goals per problem list:   1.  Goal(s):Safely detox off of alcohol  Met:  Yes  Target date:5/15  As evidenced by:Stable vitals, no withdrawal symptoms  2.  Goal (s):Identify comprehensive sobriety plan  Met:  Yes  Target date:5/15  As evidenced WU:JWJX states he will attend 90 mtgs in 90 days, he goes to [facility] for meetings, and will call [facility] on his own to try to enroll for services  3.  Goal(s):  Met:  Yes  Target date:  As evidenced by:  4.  Goal(s):  Met:  Yes  Target date:  As evidenced by:  Attendees: Patient:  [name] 06/03/2011 10:20 AM  Family:     Physician:  Lupe Carney 06/03/2011 10:20 AM   Nursing: Roswell Miners   06/03/2011 10:20 AM   Case Manager:  Richelle Ito, LCSW 06/03/2011 10:20 AM   Counselor:  Ronda Fairly, LCSWA 06/03/2011 10:20 AM   Other:     Other:     Other:     Other:      Scribe for Treatment Team:   Daryel Gerald B, 06/03/2011 10:20 AM

## 2011-06-03 NOTE — [facility] Suicide Risk Assessment (Signed)
Suicide Risk Assessment  Discharge Assessment      Demographic factors: Caucasian  Current Mental Status Per Nursing Assessment::   At Discharge:  Pt denied any SI/HI/thoughts of self harm or acute psychiatric issues in treatment team with clinical, nursing and medical team present.  Current Mental Status Per Physician: Patient seen and evaluated in team. Chart reviewed. Patient stated that his mood was "better".  His affect was mood congruent and euthymic. Pt requesting to be discharged and he already called his ride.  VSS. He denied any current thoughts of self injurious behavior, suicidal ideation or homicidal ideation. There were no auditory or visual hallucinations, paranoia, delusional thought processes, or mania noted. Thought process was linear and goal directed. No psychomotor agitation or retardation was noted. His speech was normal rate, tone and volume. Eye contact was good. Judgment and insight are fair. Patient has been up and engaged on the unit. No acute safety concerns reported from team.   Loss Factors:  Legal issues,disability  Historical Factors:  No reported hx SIB/SI/attmepts  Risk Reduction Factors: Sense of responsibility to family (parents, son and sister); willing to f/u at [facility]  Discharge Diagnoses:  AXIS I: Alcohol Dependence; Cocaine Abuse; SIMD, resolving AXIS II:  Deferred AXIS III:  Sprained ankle Past Medical History  Diagnosis Date  . Diabetic foot ulcer   . Diabetes mellitus   . Pneumonia    AXIS IV: Moderate AXIS V: 55  Cognitive Features That Contribute To Risk: limited insight.    Suicide Risk: Patient is currently viewed as a low risk of harm to himself and others in light of his history and risk factors. There are no acute safety concerns and he is stable for discharge. His continued sobriety, medication management and followup will mitigate against any potential risk in the future. He is agreeable with the plan.  Discussed with  the team.  Plan Of Care/Follow-up recommendations: Pt seen and evaluated in treatment team. Chart reviewed.  Pt stable for and requesting discharge to his home. Pt contracting for safety and does not currently meet [place] involuntary commitment criteria for continued hospitalization against his will.  Continued sobriety will mitigate against the potential increased risk of harm to self and/or others.  Discussed the importance of recovery further with pt, as well as, tools to move forward in a healthy & safe manner.  Pt agreeable with the plan.  Discussed with the team.  Please see orders, follow up appointments per AVS (Dr. Lajoyce Corners) and full discharge summary to be completed by physician extender.  Recommend follow up with AA/NA.  Diet: Diabetic.  Activity: As tolerated.     [name] 06/03/2011, 10:21 AM

## 2011-06-03 NOTE — Progress Notes (Signed)
[facility] Case Management Discharge Plan:  Will you be returning to the same living situation after discharge: Yes,  home At discharge, do you have transportation home?:Yes,  family Do you have the ability to pay for your medications:Yes,  medicare  Interagency Information:     Release of information consent forms completed and in the chart;  Patient's signature needed at discharge.  Patient to Follow up at:  Follow-up Information    Follow up with [facility] in 90 days,  get a sponser.         Patient denies SI/HI:   Yes,  yes    Safety Planning and Suicide Prevention discussed:  Yes,  yes  Barrier to discharge identified:No.  Summary and Recommendations:   [name] 06/03/2011, 10:26 AM

## 2011-06-05 NOTE — Progress Notes (Signed)
Patient Discharge Instructions: No consent for Dr. Leata Mouse, Eduard Clos, 06/05/2011, 12:06 PM
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
## 2011-08-01 ENCOUNTER — Emergency Department (HOSPITAL_COMMUNITY)
Admission: EM | Admit: 2011-08-01 | Discharge: 2011-08-02 | Disposition: A | Discharge status: Other Acute Inpatient Hospital | Payer: Medicare Other | Source: Home / Self Care | Attending: Emergency Medicine | Admitting: Emergency Medicine

## 2011-08-01 ENCOUNTER — Encounter (HOSPITAL_COMMUNITY): Payer: Self-pay

## 2011-08-01 DIAGNOSIS — R45851 Suicidal ideations: Secondary | ICD-10-CM

## 2011-08-01 DIAGNOSIS — M25569 Pain in unspecified knee: Secondary | ICD-10-CM | POA: Insufficient documentation

## 2011-08-01 DIAGNOSIS — E119 Type 2 diabetes mellitus without complications: Secondary | ICD-10-CM | POA: Insufficient documentation

## 2011-08-01 DIAGNOSIS — B070 Plantar wart: Secondary | ICD-10-CM | POA: Insufficient documentation

## 2011-08-01 DIAGNOSIS — K5900 Constipation, unspecified: Secondary | ICD-10-CM | POA: Insufficient documentation

## 2011-08-01 DIAGNOSIS — R109 Unspecified abdominal pain: Secondary | ICD-10-CM | POA: Insufficient documentation

## 2011-08-01 DIAGNOSIS — K029 Dental caries, unspecified: Secondary | ICD-10-CM | POA: Insufficient documentation

## 2011-08-01 DIAGNOSIS — L84 Corns and callosities: Secondary | ICD-10-CM | POA: Insufficient documentation

## 2011-08-01 DIAGNOSIS — F1010 Alcohol abuse, uncomplicated: Secondary | ICD-10-CM

## 2011-08-01 DIAGNOSIS — G8929 Other chronic pain: Secondary | ICD-10-CM | POA: Insufficient documentation

## 2011-08-01 HISTORY — DX: Alcohol abuse, uncomplicated: F10.10

## 2011-08-01 LAB — COMPREHENSIVE METABOLIC PANEL
Albumin: 3.6 g/dL (ref 3.5–5.2)
Alkaline Phosphatase: 99 U/L (ref 39–117)
BUN: 8 mg/dL (ref 6–23)
Chloride: 96 mEq/L (ref 96–112)
Potassium: 3.2 mEq/L — ABNORMAL LOW (ref 3.5–5.1)
Total Bilirubin: 0.3 mg/dL (ref 0.3–1.2)

## 2011-08-01 LAB — DIFFERENTIAL
Basophils Relative: 0 % (ref 0–1)
Lymphocytes Relative: 32 % (ref 12–46)
Lymphs Abs: 2.1 10*3/uL (ref 0.7–4.0)
Monocytes Relative: 9 % (ref 3–12)
Neutro Abs: 3.9 10*3/uL (ref 1.7–7.7)
Neutrophils Relative %: 59 % (ref 43–77)

## 2011-08-01 LAB — URINALYSIS, ROUTINE W REFLEX MICROSCOPIC
Bilirubin Urine: NEGATIVE
Glucose, UA: 250 mg/dL — AB
Ketones, ur: NEGATIVE mg/dL
Nitrite: NEGATIVE
pH: 6 (ref 5.0–8.0)

## 2011-08-01 LAB — RAPID URINE DRUG SCREEN, HOSP PERFORMED
Amphetamines: NOT DETECTED
Barbiturates: NOT DETECTED
Benzodiazepines: NOT DETECTED
Cocaine: NOT DETECTED
Tetrahydrocannabinol: NOT DETECTED

## 2011-08-01 LAB — LIPASE, BLOOD: Lipase: 45 U/L (ref 11–59)

## 2011-08-01 LAB — CBC
Hemoglobin: 15.9 g/dL (ref 13.0–17.0)
RBC: 5.62 MIL/uL (ref 4.22–5.81)
WBC: 6.7 10*3/uL (ref 4.0–10.5)

## 2011-08-01 MED ORDER — TRAZODONE HCL 50 MG PO TABS
200.0000 mg | ORAL_TABLET | Freq: Every day | ORAL | Status: DC
  Administered 2011-08-01: 200 mg via ORAL
  Filled 2011-08-01 (×2): qty 4

## 2011-08-01 MED ORDER — ALUM & MAG HYDROXIDE-SIMETH 200-200-20 MG/5ML PO SUSP: 30.0000 mL | ORAL | Status: DC | PRN

## 2011-08-01 MED ORDER — ONDANSETRON HCL 4 MG/2ML IJ SOLN
4.0000 mg | Freq: Once | INTRAMUSCULAR | Status: AC
  Administered 2011-08-01: 4 mg via INTRAVENOUS
  Filled 2011-08-01: qty 2

## 2011-08-01 MED ORDER — POTASSIUM CHLORIDE CRYS ER 20 MEQ PO TBCR
40.0000 meq | EXTENDED_RELEASE_TABLET | Freq: Once | ORAL | Status: AC
  Administered 2011-08-01: 40 meq via ORAL
  Filled 2011-08-01: qty 2

## 2011-08-01 MED ORDER — FOLIC ACID 1 MG PO TABS
1.0000 mg | ORAL_TABLET | Freq: Every day | ORAL | Status: DC
  Administered 2011-08-01: 1 mg via ORAL
  Filled 2011-08-01: qty 1

## 2011-08-01 MED ORDER — LORAZEPAM 1 MG PO TABS
0.0000 mg | ORAL_TABLET | Freq: Four times a day (QID) | ORAL | Status: DC
  Administered 2011-08-01: 1 mg via ORAL
  Filled 2011-08-01: qty 1

## 2011-08-01 MED ORDER — ADULT MULTIVITAMIN W/MINERALS CH
1.0000 | ORAL_TABLET | Freq: Every day | ORAL | Status: DC
  Administered 2011-08-01: 1 via ORAL
  Filled 2011-08-01: qty 1

## 2011-08-01 MED ORDER — SODIUM CHLORIDE 0.9 % IV SOLN
1000.0000 mL | INTRAVENOUS | Status: DC
  Administered 2011-08-01: 1000 mL via INTRAVENOUS

## 2011-08-01 MED ORDER — LORAZEPAM 1 MG PO TABS: 0.0000 mg | ORAL_TABLET | Freq: Two times a day (BID) | ORAL | Status: DC

## 2011-08-01 MED ORDER — THIAMINE HCL 100 MG/ML IJ SOLN
100.0000 mg | Freq: Every day | INTRAMUSCULAR | Status: DC
  Filled 2011-08-01: qty 2

## 2011-08-01 MED ORDER — ONDANSETRON HCL 4 MG PO TABS: 4.0000 mg | ORAL_TABLET | Freq: Three times a day (TID) | ORAL | Status: DC | PRN

## 2011-08-01 MED ORDER — OXYCODONE-ACETAMINOPHEN 5-325 MG PO TABS
1.0000 | ORAL_TABLET | Freq: Four times a day (QID) | ORAL | Status: DC | PRN
  Administered 2011-08-01: 1 via ORAL
  Filled 2011-08-01: qty 1

## 2011-08-01 MED ORDER — HYDROMORPHONE HCL PF 1 MG/ML IJ SOLN: 1.0000 mg | Freq: Once | INTRAMUSCULAR | Status: DC

## 2011-08-01 MED ORDER — NICOTINE 21 MG/24HR TD PT24
21.0000 mg | MEDICATED_PATCH | Freq: Every day | TRANSDERMAL | Status: DC
  Administered 2011-08-01: 21 mg via TRANSDERMAL
  Filled 2011-08-01: qty 1

## 2011-08-01 MED ORDER — LORAZEPAM 2 MG/ML IJ SOLN: 1.0000 mg | Freq: Four times a day (QID) | INTRAMUSCULAR | Status: DC | PRN

## 2011-08-01 MED ORDER — LORAZEPAM 1 MG PO TABS: 1.0000 mg | ORAL_TABLET | Freq: Four times a day (QID) | ORAL | Status: DC | PRN

## 2011-08-01 MED ORDER — VITAMIN B-1 100 MG PO TABS
100.0000 mg | ORAL_TABLET | Freq: Every day | ORAL | Status: DC
  Administered 2011-08-01: 100 mg via ORAL
  Filled 2011-08-01: qty 1

## 2011-08-01 MED ORDER — SODIUM CHLORIDE 0.9 % IV SOLN
1000.0000 mL | Freq: Once | INTRAVENOUS | Status: AC
  Administered 2011-08-01: 1000 mL via INTRAVENOUS

## 2011-08-01 MED ORDER — PENICILLIN V POTASSIUM 250 MG PO TABS
500.0000 mg | ORAL_TABLET | Freq: Three times a day (TID) | ORAL | Status: DC
  Administered 2011-08-01: 500 mg via ORAL
  Filled 2011-08-01: qty 2

## 2011-08-01 MED ORDER — TRAZODONE HCL 50 MG PO TABS
ORAL_TABLET | ORAL | Status: AC
  Filled 2011-08-01: qty 4

## 2011-08-01 NOTE — BH Assessment (Signed)
Assessment Note   [name] is an 46 y.o. male. He returns to the ED this evening because he was having suicidal thoughts and feeling depressed. He states that he and his girlfriend have just broken up. Apparently, she had another man she was seeing. This was devastating to him, so he started drinking. He is drinking 24 beers per day. He says that he does not have any pending court hearings; his recent DWI was heard and he has community service and fines to complete. He denies any thoughts now of suicidal ideation; no thoughts of homicidal ideation. No delusions or hallucinations noted. He states that he was able to stay clean almost 2 months since his discharge from [facility] in May; with the triggering event being this issue with his girlfriend. He reports that he did not have any outpatient services upon his last inpatient treatment. He states he is interested in Rehab after completing his detox; has heard about [facility] and is interested in getting some information on that program. He knows he will not get any narcotics while at [facility]. He is pleasant, cooperative and appears to want to get clean.  Axis I: Substance Abuse, Alcohol Dependence; Partner relational issues Axis II: Deferred Axis III See medical history Axis IV: Financial stressors from DWI;  Social isolations, relationship issues Axis V: GAF 35  Past Medical History:  Past Medical History  Diagnosis Date  . Diabetic foot ulcer   . Diabetes mellitus   . Pneumonia   . ETOH abuse     Past Surgical History  Procedure Date  . Total hip arthroplasty   . Total knee arthroplasty   . Joint replacement   . Lung lobectomy   . Hip surgery   . Knee surgery   . Lung lobectomy     Family History: No family history on file.  Social History:  reports that he has been smoking.  He does not have any smokeless tobacco history on file. He reports that he drinks alcohol. He reports that he uses illicit drugs (Marijuana and  Cocaine).  Additional Social History:     CIWA: CIWA-Ar BP: 157/91 mmHg Pulse Rate: 104  Nausea and Vomiting: no nausea and no vomiting Tactile Disturbances: none Tremor: no tremor Auditory Disturbances: not present Paroxysmal Sweats: no sweat visible Visual Disturbances: not present Anxiety: mildly anxious Headache, Fullness in Head: none present Agitation: normal activity Orientation and Clouding of Sensorium: oriented and can do serial additions CIWA-Ar Total: 1  COWS:    Allergies:  Allergies  Allergen Reactions  . Benadryl (Diphenhydramine Hcl) Other (See Comments)    Pt states leg spasm    Home Medications:  (Not in a hospital admission)  OB/GYN Status:  No LMP for male patient.  General Assessment Data Location of Assessment: [facility] ACT Assessment: Yes Living Arrangements: Alone Can pt return to current living arrangement?: Yes Admission Status: Voluntary Is patient capable of signing voluntary admission?: Yes Transfer from: Acute Hospital Referral Source: MD  Education Status Is patient currently in school?: No  Risk to self Suicidal Ideation: No Suicidal Intent: No Is patient at risk for suicide?: No Suicidal Plan?: No Access to Means: No What has been your use of drugs/alcohol within the last 12 months?:  (relapsed 1 1/2 weeks ago) Previous Attempts/Gestures: No Intentional Self Injurious Behavior: None Family Suicide History: No Recent stressful life event(s): Conflict (Comment) Persecutory voices/beliefs?: No Depression: Yes Depression Symptoms: Insomnia;Isolating;Loss of interest in usual pleasures;Feeling worthless/self pity Substance abuse history  and/or treatment for substance abuse?: Yes Suicide prevention information given to non-admitted patients: Not applicable  Risk to Others Homicidal Ideation: No Thoughts of Harm to Others: No Current Homicidal Intent: No Current Homicidal Plan: No Access to Homicidal Means: No History of harm  to others?: No Assessment of Violence: None Noted Does patient have access to weapons?: No Criminal Charges Pending?: No Does patient have a court date: No  Psychosis Hallucinations: None noted Delusions: None noted  Mental Status Report Appear/Hygiene: Disheveled Eye Contact: Fair Motor Activity: Freedom of movement Speech: Logical/coherent Level of Consciousness: Alert;Quiet/awake Mood: Depressed Affect: Appropriate to circumstance Anxiety Level: Minimal Thought Processes: Coherent Judgement: Unimpaired Orientation: Person;Place;Time;Situation;Appropriate for developmental age Obsessive Compulsive Thoughts/Behaviors: Minimal  Cognitive Functioning Concentration: Decreased Memory: Recent Intact;Remote Intact IQ: Average Insight: Fair Impulse Control: Fair Appetite: Fair Sleep: Decreased Total Hours of Sleep:  (5)  ADLScreening [facility] Assessment Services) Patient's cognitive ability adequate to safely complete daily activities?: Yes Patient able to express need for assistance with ADLs?: Yes Independently performs ADLs?: Yes  Abuse/Neglect [facility]) Physical Abuse: Denies Verbal Abuse: Denies Sexual Abuse: Denies  Prior Inpatient Therapy Prior Inpatient Therapy: Yes Prior Therapy Dates:  (May 2013) Prior Therapy Facilty/Provider(s):  [facility]) Reason for Treatment:  (detox)  Prior Outpatient Therapy Prior Outpatient Therapy: No  ADL Screening (condition at time of admission) Patient's cognitive ability adequate to safely complete daily activities?: Yes Patient able to express need for assistance with ADLs?: Yes Independently performs ADLs?: Yes       Abuse/Neglect Assessment (Assessment to be complete while patient is alone) Physical Abuse: Denies Verbal Abuse: Denies Sexual Abuse: Denies Values / Beliefs Cultural Requests During Hospitalization: None Spiritual Requests During Hospitalization: None        Additional Information 1:1 In Past 12  Months?: No CIRT Risk: No Elopement Risk: No Does patient have medical clearance?: Yes     Disposition:  Disposition Disposition of Patient: Inpatient treatment program Type of inpatient treatment program: Adult  On Site Evaluation by:  Dr. Lynelle Doctor Reviewed with Physician:  Dr. Lynelle Doctor  Referring patient to [facility] for detox.   Shon Baton H 08/01/2011 10:52 PM

## 2011-08-01 NOTE — ED Notes (Signed)
Lab called urine sample not labled, must be recollected.

## 2011-08-01 NOTE — ED Notes (Signed)
Pt watching tv at this time. Sitter remains at bedside. NAD noted, no needs voiced at this time.

## 2011-08-01 NOTE — ED Notes (Signed)
Pt admits to a drinking binge for about a week, states he is having stomach pain, diff urinating-states he hasn't urinated for 2 days and no bm for 4 days.

## 2011-08-01 NOTE — ED Notes (Signed)
Pt requesting a drink to help getting a urine sample. Pt wanting a nicotine patch & states he is worried about going into [facility]. EDP notified.

## 2011-08-01 NOTE — ED Notes (Addendum)
Pt reports he has been drinking heavily for the past week. Pt states upper abdominal pain described as a burning. Thoughts of suicide for 1 week. Break up w/ girlfriend started drinking & thoughts of suicide.

## 2011-08-01 NOTE — ED Notes (Signed)
Pt also relates that he has recently had separation from his girlfriend and that he has been drinking heavily for a week.   Pt admits to thoughts of killing himself and states "it's a good thing I don't have a gun"   Pt denies previous suicide attempts and is calm and cooperative

## 2011-08-01 NOTE — ED Provider Notes (Addendum)
History     CSN: [id number]  Arrival date & time 08/01/11  4098   First MD Initiated Contact with Patient 08/01/11 1924      Chief Complaint  Patient presents with  . Abdominal Pain  . Urinary Retention  . Knee Pain   HPI The patient presents to the emergency room with complaints of an alcohol binge ongoing for the last week. This all started after breaking up with his girlfriend. Patient has history of substance abuse. He states he's been drinking heavily every day. About 15-20 beers per week.  He also has been using marijuana and cocaine. He last drank alcohol today. Associated with this alcohol use he has been having constipation and difficulty with urination. He has abdominal pain associated with that but no nausea vomiting or diarrhea. He also complains of chronic right knee pain.  He also has a chronic foot ulcer on his left foot. He has seen a podiatrist and had the callous debrided. He supposed to be getting special orthotic shoes.  Patient was previously admitted to [facility] behavior health and treated for alcohol abuse approximately 2 months ago. He felt that he did well after treatment. Past Medical History  Diagnosis Date  . Diabetic foot ulcer   . Diabetes mellitus   . Pneumonia   . ETOH abuse     Past Surgical History  Procedure Date  . Total hip arthroplasty   . Total knee arthroplasty   . Joint replacement   . Lung lobectomy   . Hip surgery   . Knee surgery   . Lung lobectomy     No family history on file.  History  Substance Use Topics  . Smoking status: Current Everyday Smoker -- 2.0 packs/day  . Smokeless tobacco: Not on file  . Alcohol Use: 0.0 oz/week    15-20 drink(s) per week     prefers beer      Review of Systems  All other systems reviewed and are negative.    Allergies  Benadryl  Home Medications   Current Outpatient Rx  Name Route Sig Dispense Refill  . TRAZODONE HCL 100 MG PO TABS Oral Take 2 tablets (200 mg total) by mouth at  bedtime. For sleep 60 tablet 0    BP 157/91  Pulse 104  Temp 97.8 F (36.6 C) (Oral)  Resp 22  Ht 6\' 3"  (1.905 m)  Wt 170 lb (77.111 kg)  BMI 21.25 kg/m2  SpO2 98%  Physical Exam  Nursing note and vitals reviewed. Constitutional: No distress.  HENT:  Head: Normocephalic and atraumatic. No trismus in the jaw.  Right Ear: External ear normal.  Left Ear: External ear normal.  Mouth/Throat: No oral lesions. Dental caries present. No uvula swelling.  Eyes: Conjunctivae are normal. Right eye exhibits no discharge. Left eye exhibits no discharge. No scleral icterus.  Neck: Neck supple. No tracheal deviation present.  Cardiovascular: Normal rate, regular rhythm and intact distal pulses.   Pulmonary/Chest: Effort normal and breath sounds normal. No stridor. No respiratory distress. He has no wheezes. He has no rales.  Abdominal: Soft. Bowel sounds are normal. He exhibits no distension, no fluid wave, no ascites and no mass. There is tenderness (mild epigastrum and ruq). There is no rebound and no guarding. No hernia.  Musculoskeletal: He exhibits tenderness. He exhibits no edema.       Knee pain with range of motion, no effusion, no erythema; left hoot with plantar wart and callus, no ulceration noted on exam  Neurological: He is alert. He has normal strength. No sensory deficit. Cranial nerve deficit:  no gross defecits noted. He exhibits normal muscle tone. He displays no seizure activity. Coordination normal.  Skin: Skin is warm and dry. No rash noted.  Psychiatric: He has a normal mood and affect.    ED Course  Procedures (including critical care time)  Labs Reviewed  CBC - Abnormal; Notable for the following:    Platelets 137 (*)     All other components within normal limits  COMPREHENSIVE METABOLIC PANEL - Abnormal; Notable for the following:    Sodium 134 (*)     Potassium 3.2 (*)     Glucose, Bld 180 (*)     AST 40 (*)     All other components within normal limits    URINALYSIS, ROUTINE W REFLEX MICROSCOPIC - Abnormal; Notable for the following:    Glucose, UA 250 (*)     All other components within normal limits  ETHANOL - Abnormal; Notable for the following:    Alcohol, Ethyl (B) 109 (*)     All other components within normal limits  DIFFERENTIAL  LIPASE, BLOOD  URINE RAPID DRUG SCREEN (HOSP PERFORMED)   No results found.     MDM  Bladder scan was 200.  No sign of urinary retention.  No signs of significant abnormalities on labs.  Oral potassium replaced.  Will start on penicillin for his toothache Pt medically stable.  Will have ACT assess for inpatient treatment.     Celene Kras, MD 08/01/11 [phone]

## 2011-08-02 ENCOUNTER — Inpatient Hospital Stay (HOSPITAL_COMMUNITY)
Admission: AD | Admit: 2011-08-02 | Discharge: 2011-08-07 | DRG: 897 | Disposition: A | Discharge status: Home / Self Care | Payer: Medicare Other | Attending: Psychiatry | Admitting: Psychiatry

## 2011-08-02 DIAGNOSIS — Z96649 Presence of unspecified artificial hip joint: Secondary | ICD-10-CM

## 2011-08-02 DIAGNOSIS — L97509 Non-pressure chronic ulcer of other part of unspecified foot with unspecified severity: Secondary | ICD-10-CM | POA: Diagnosis present

## 2011-08-02 DIAGNOSIS — E1169 Type 2 diabetes mellitus with other specified complication: Secondary | ICD-10-CM | POA: Diagnosis present

## 2011-08-02 DIAGNOSIS — R339 Retention of urine, unspecified: Secondary | ICD-10-CM | POA: Diagnosis present

## 2011-08-02 DIAGNOSIS — G4700 Insomnia, unspecified: Secondary | ICD-10-CM | POA: Diagnosis present

## 2011-08-02 DIAGNOSIS — M25569 Pain in unspecified knee: Secondary | ICD-10-CM | POA: Diagnosis present

## 2011-08-02 DIAGNOSIS — Z79899 Other long term (current) drug therapy: Secondary | ICD-10-CM

## 2011-08-02 DIAGNOSIS — G8929 Other chronic pain: Secondary | ICD-10-CM | POA: Diagnosis present

## 2011-08-02 DIAGNOSIS — F17200 Nicotine dependence, unspecified, uncomplicated: Secondary | ICD-10-CM | POA: Diagnosis present

## 2011-08-02 DIAGNOSIS — F1020 Alcohol dependence, uncomplicated: Secondary | ICD-10-CM | POA: Diagnosis present

## 2011-08-02 DIAGNOSIS — F1011 Alcohol abuse, in remission: Secondary | ICD-10-CM

## 2011-08-02 DIAGNOSIS — F1910 Other psychoactive substance abuse, uncomplicated: Secondary | ICD-10-CM

## 2011-08-02 DIAGNOSIS — R7301 Impaired fasting glucose: Secondary | ICD-10-CM

## 2011-08-02 DIAGNOSIS — L97519 Non-pressure chronic ulcer of other part of right foot with unspecified severity: Secondary | ICD-10-CM

## 2011-08-02 DIAGNOSIS — F1999 Other psychoactive substance use, unspecified with unspecified psychoactive substance-induced disorder: Principal | ICD-10-CM | POA: Diagnosis present

## 2011-08-02 DIAGNOSIS — F1410 Cocaine abuse, uncomplicated: Secondary | ICD-10-CM | POA: Diagnosis present

## 2011-08-02 DIAGNOSIS — Z96659 Presence of unspecified artificial knee joint: Secondary | ICD-10-CM

## 2011-08-02 MED ORDER — TRAZODONE HCL 100 MG PO TABS
200.0000 mg | ORAL_TABLET | Freq: Every day | ORAL | Status: DC
  Filled 2011-08-02 (×3): qty 2

## 2011-08-02 MED ORDER — TRAZODONE HCL 100 MG PO TABS
200.0000 mg | ORAL_TABLET | Freq: Every day | ORAL | Status: DC
  Administered 2011-08-02 – 2011-08-06 (×5): 200 mg via ORAL
  Filled 2011-08-02 (×7): qty 2

## 2011-08-02 MED ORDER — ONDANSETRON 4 MG PO TBDP
4.0000 mg | ORAL_TABLET | Freq: Four times a day (QID) | ORAL | Status: AC | PRN
  Administered 2011-08-02: 4 mg via ORAL

## 2011-08-02 MED ORDER — NICOTINE 21 MG/24HR TD PT24
21.0000 mg | MEDICATED_PATCH | Freq: Every day | TRANSDERMAL | Status: DC
  Administered 2011-08-02 – 2011-08-07 (×6): 21 mg via TRANSDERMAL
  Filled 2011-08-02 (×6): qty 1

## 2011-08-02 MED ORDER — LOPERAMIDE HCL 2 MG PO CAPS: 2.0000 mg | ORAL_CAPSULE | ORAL | Status: AC | PRN

## 2011-08-02 MED ORDER — IBUPROFEN 800 MG PO TABS
800.0000 mg | ORAL_TABLET | Freq: Four times a day (QID) | ORAL | Status: DC | PRN
  Administered 2011-08-02 – 2011-08-06 (×11): 800 mg via ORAL
  Filled 2011-08-02 (×4): qty 1
  Filled 2011-08-02: qty 4
  Filled 2011-08-02 (×6): qty 1

## 2011-08-02 MED ORDER — ADULT MULTIVITAMIN W/MINERALS CH
1.0000 | ORAL_TABLET | Freq: Every day | ORAL | Status: DC
  Administered 2011-08-02 – 2011-08-07 (×6): 1 via ORAL
  Filled 2011-08-02 (×7): qty 1

## 2011-08-02 MED ORDER — CHLORDIAZEPOXIDE HCL 25 MG PO CAPS
25.0000 mg | ORAL_CAPSULE | Freq: Once | ORAL | Status: AC
  Administered 2011-08-02: 25 mg via ORAL
  Filled 2011-08-02: qty 1

## 2011-08-02 MED ORDER — VITAMIN B-1 100 MG PO TABS
100.0000 mg | ORAL_TABLET | Freq: Every day | ORAL | Status: DC
  Administered 2011-08-03 – 2011-08-07 (×5): 100 mg via ORAL
  Filled 2011-08-02 (×6): qty 1

## 2011-08-02 MED ORDER — THIAMINE HCL 100 MG/ML IJ SOLN: 100.0000 mg | Freq: Once | INTRAMUSCULAR | Status: DC

## 2011-08-02 MED ORDER — CHLORDIAZEPOXIDE HCL 25 MG PO CAPS
25.0000 mg | ORAL_CAPSULE | Freq: Four times a day (QID) | ORAL | Status: DC | PRN
  Administered 2011-08-02 – 2011-08-04 (×7): 25 mg via ORAL
  Filled 2011-08-02 (×7): qty 1

## 2011-08-02 MED ORDER — MAGNESIUM HYDROXIDE 400 MG/5ML PO SUSP
30.0000 mL | Freq: Every day | ORAL | Status: DC | PRN
  Administered 2011-08-06: 30 mL via ORAL

## 2011-08-02 MED ORDER — ACETAMINOPHEN 325 MG PO TABS: 650.0000 mg | ORAL_TABLET | Freq: Four times a day (QID) | ORAL | Status: DC | PRN

## 2011-08-02 MED ORDER — HYDROXYZINE HCL 25 MG PO TABS
25.0000 mg | ORAL_TABLET | Freq: Four times a day (QID) | ORAL | Status: AC | PRN
  Administered 2011-08-02 – 2011-08-04 (×7): 25 mg via ORAL
  Filled 2011-08-02 (×4): qty 1

## 2011-08-02 MED ORDER — ALUM & MAG HYDROXIDE-SIMETH 200-200-20 MG/5ML PO SUSP: 30.0000 mL | ORAL | Status: DC | PRN

## 2011-08-02 MED ORDER — OXYCODONE-ACETAMINOPHEN 5-325 MG PO TABS
1.0000 | ORAL_TABLET | Freq: Once | ORAL | Status: AC
  Administered 2011-08-02: 1 via ORAL
  Filled 2011-08-02: qty 1

## 2011-08-02 NOTE — Progress Notes (Signed)
[facility] Adult Inpatient Family/Significant Other Suicide Prevention Education  Suicide Prevention Education:  Education Completed; Lillie Bollig (mother) [phone] has been identified by the patient as the family member/significant other with whom the patient will be residing, and identified as the person(s) who will aid the patient in the event of a mental health crisis (suicidal ideations/suicide attempt).  With written consent from the patient, the family member/significant other has been provided the following suicide prevention education, prior to the and/or following the discharge of the patient.  The suicide prevention education provided includes the following:  Suicide risk factors  Suicide prevention and interventions  National Suicide Hotline telephone number  [facility] assessment telephone number  [facility] Emergency Assistance 911  [facility] and/or Residential Mobile Crisis Unit telephone number  Request made of family/significant other to:  Remove weapons (e.g., guns, rifles, knives), all items previously/currently identified as safety concern.    Remove drugs/medications (over-the-counter, prescriptions, illicit drugs), all items previously/currently identified as a safety concern.  The family member/significant other verbalizes understanding of the suicide prevention education information provided.  The family member/significant other agrees to remove the items of safety concern listed above.  Mother is worried that the pt is "out of control" and this (relaspe) has occurred "too many times before".  She would like to see the pt go to a treatment center. She stated "nothing works" and she would like to have her daughter (pt's sister) involved in D/C plans for she is helping coordinate plans.  [facility] 08/02/2011, 10:54 AM

## 2011-08-02 NOTE — Progress Notes (Signed)
Adult Psychosocial Assessment Update Interdisciplinary Team  Previous Behavior Health Hospital admissions/discharges:  Admissions Discharges  Date: 02/08/11  Date: 02/12/11  Date:05/31/11 Date: 06/03/11  Date: Date:  Date: Date:  Date: Date:   Changes since the last Psychosocial Assessment (including adherence to outpatient mental health and/or substance abuse treatment, situational issues contributing to decompensation and/or relapse).   Pt. stated, "Things were getting better but I got into an argument with my girlfriend.     We were living together but she is moving out and will not be there when I'm discharged"           Discharge Plan 1. Will you be returning to the same living situation after discharge?   Yes: "I will" No:      If no, what is your plan?           2. Would you like a referral for services when you are discharged? Yes:     If yes, for what services?  No:         Yes.  A longterm treatment facility       Summary and Recommendations (to be completed by the evaluator)   Pt. Is a 46 yr. Old male.  Recommendations for treatment include crisis stabilization,    case mgmt., medication mgmt., psycoeducation to teach coping skills and group therapy.                     Signature:  Rhunette Croft, 08/02/2011 8:09 AM

## 2011-08-02 NOTE — Tx Team (Signed)
Initial Interdisciplinary Treatment Plan  PATIENT STRENGTHS: (choose at least two) Ability for insight Active sense of humor Average or above average intelligence Motivation for treatment/growth Supportive family/friends  PATIENT STRESSORS: Financial difficulties Health problems Marital or family conflict Substance abuse   PROBLEM LIST: Problem List/Patient Goals Date to be addressed Date deferred Reason deferred Estimated date of resolution  Substance abuse 08/02/11     Depression 08/02/11     SI 08/02/11                                          DISCHARGE CRITERIA:  Improved stabilization in mood, thinking, and/or behavior Need for constant or close observation no longer present Verbal commitment to aftercare and medication compliance Withdrawal symptoms are absent or subacute and managed without 24-hour nursing intervention  PRELIMINARY DISCHARGE PLAN: Attend 12-step recovery group Return to previous living arrangement  PATIENT/FAMIILY INVOLVEMENT: This treatment plan has been presented to and reviewed with the patient, [name].  The patient and family have been given the opportunity to ask questions and make suggestions.  Juliann Pares 08/02/2011, 4:13 AM

## 2011-08-02 NOTE — ED Notes (Signed)
Pt accepted at [facility] by Lenna Sciara PA, room 302 bed 1. Call report to [phone]

## 2011-08-02 NOTE — H&P (Signed)
Psychiatric Admission Assessment Adult  Patient Identification:  [name] Date of Evaluation:  08/02/2011 46yo DWM CC; alcohol binge this past week ETOH 109 UDS neg.  History of Present Illness: Girlfriend broke up with him and this was his reason to drink. Says he had been sober since last last discharge in May. Did have his DWI hearing and is to do community service -by the end of the month and will be allowed to drive once he has a breatholizer on the steering wheel.    Past Psychiatric History: Alcohol detox May for 3 days was also here in January.    Substance Abuse History:  Social History:    reports that he has been smoking.  He does not have any smokeless tobacco history on file. He reports that he drinks alcohol. He reports that he uses illicit drugs (Marijuana and Cocaine). For further details please see old chart  Family Psych History: Father drank but stopped after a stroke 10 years ago.   Past Medical History:     Past Medical History  Diagnosis Date  . Diabetic foot ulcer   . Diabetes mellitus   . Pneumonia   . ETOH abuse        Past Surgical History  Procedure Date  . Total hip arthroplasty   . Total knee arthroplasty   . Joint replacement   . Lung lobectomy   . Hip surgery   . Knee surgery   . Lung lobectomy     Allergies:  Allergies  Allergen Reactions  . Benadryl (Diphenhydramine Hcl) Other (See Comments)    Pt states leg spasm    Current Medications:  Prior to Admission medications   Medication Sig Start Date End Date Taking? Authorizing Provider  oxyCODONE-acetaminophen (PERCOCET) 5-325 MG per tablet Take 1 tablet by mouth 4 (four) times daily.    Historical Provider, MD  traZODone (DESYREL) 100 MG tablet Take 2 tablets (200 mg total) by mouth at bedtime. For sleep 06/03/11 07/03/11  Viviann Spare, FNP  traZODone (DESYREL) 100 MG tablet Take 200 mg by mouth at bedtime.    Historical Provider, MD    Mental Status  Examination/Evaluation: Objective:  Appearance: Fairly Groomed  Psychomotor Activity:  Walks with a cane   Eye Contact: Good  Speech:  Normal Rate  Volume:  Normal  Mood:  appropriate   Affect:  Full Range  Thought Process:  Clear rational goal oriented - avoid community service got to rehab  Orientation:  Full  Thought Content:  No AVH or psychosis   Suicidal Thoughts:  No  Homicidal Thoughts:  No  Judgement:  Fair  Insight:  Shallow    DIAGNOSIS:    AXIS I Alcohol Abuse and Substance Abuse  AXIS II Deferred   AXIS III See medical history.  AXIS IV economic problems, other psychosocial or environmental problems, problems related to legal system/crime, problems related to social environment and problems with primary support group  AXIS V 61-70 mild symptoms     Treatment Plan Summary: Admit for safety & stabilization  Detox as needed using [facility] detox protocol. Case manager to check on community service requirements  Agree with H&P from ED

## 2011-08-02 NOTE — Progress Notes (Signed)
Psychoeducational Group Note  Date:  08/02/2011 Time:  1515  Group Topic/Focus:  Conflict Resolution:   The focus of this group is to discuss the conflict resolution process and how it may be used upon discharge.  Participation Level:  None  Participation Quality:  Appropriate and Resistant  Affect:  Appropriate and Flat  Cognitive:  Alert and Appropriate  Insight:  pt did not participate  Engagement in Group:  None  Additional Comments:  Pt attended group but did not participate.   Dalia Heading 08/02/2011, 4:41 PM

## 2011-08-02 NOTE — Progress Notes (Signed)
Patient ID: [name], male   DOB: 25-Aug-1965, 46 y.o.   MRN: [id number] Pt. attended and participated in aftercare planning group. Pt. verbally accepted information on suicide prevention, warning signs to look for with suicide and crisis line numbers to use. The pt. agreed to call crisis line numbers if having warning signs or having thoughts of suicide. Pt. listed their current anxiety level as 7 and depression as a 9 on a scale of 1 to 10 with 10 being the high.  Pt. indicated that he is still thinking about situation that triggered his relapse.

## 2011-08-02 NOTE — Progress Notes (Signed)
Psychoeducational Group Note  Date:  08/02/2011 Time:  1015  Group Topic/Focus:  Making Healthy Choices:   The focus of this group is to help patients identify negative/unhealthy choices they were using prior to admission and identify positive/healthier coping strategies to replace them upon discharge.  Participation Level:  Active  Participation Quality:  Appropriate  Affect:  Appropriate  Cognitive:  Alert  Insight:  Good  Engagement in Group:  Good  Additional Comments:    Gilman Olazabal A 08/02/2011  

## 2011-08-02 NOTE — [facility] Suicide Risk Assessment (Signed)
Suicide Risk Assessment  Admission Assessment     Demographic factors:  Assessment Details Time of Assessment: Admission Information Obtained From: Patient Current Mental Status:  Current Mental Status: Self-harm thoughts (states had some SI last week, denies now) Loss Factors:  Loss Factors: Loss of significant relationship;Decline in physical health;Financial problems / change in socioeconomic status Historical Factors:  Historical Factors: Family history of mental illness or substance abuse Risk Reduction Factors:  Risk Reduction Factors: Sense of responsibility to family;Religious beliefs about death  CLINICAL FACTORS:   Severe Anxiety and/or Agitation Alcohol/Substance Abuse/Dependencies Chronic Pain Unstable or Poor Therapeutic Relationship Previous Psychiatric Diagnoses and Treatments Medical Diagnoses and Treatments/Surgeries  COGNITIVE FEATURES THAT CONTRIBUTE TO RISK:  Loss of executive function Polarized thinking    SUICIDE RISK:   Minimal: No identifiable suicidal ideation.  Patients presenting with no risk factors but with morbid ruminations; may be classified as minimal risk based on the severity of the depressive symptoms  PLAN OF CARE: Patient was admitted for detox treatment and may need rehab services. He has relationship problems which resulted relaps. His dad was supportive to him.    [name],JANARDHAHA R. 08/02/2011, 11:59 AM

## 2011-08-02 NOTE — Progress Notes (Signed)
Pt attended this evenings speaker AA meeting and was appropriate and attentive.  

## 2011-08-02 NOTE — Progress Notes (Signed)
D) Pt attending the groups and participated some. Mood is depressed and sad. Affect flat. Was focused on his medications today. Rates his depression at a 6 and his hopelessness at a 7. Denies SI and HI.  A). Given support and reassurance. Encouraged to do his workbook and invest in his recovery. R). Attending the program. Remains flat, sad and depressed.

## 2011-08-02 NOTE — Progress Notes (Signed)
Pt is a 46 year old Caucasion male admitted to the services of Dr. Lewis Shock for detox from ETOH.  Pt had been drinking a case of 24 12 oz beers daily.  Pt is on disability and has multiple health concerns including right hip surgery, left hip in need of surgery, right knee surgery, left knee in need of surgery, peripheral neuropathy to both feet, and a diabetic ulcer scabbed over on bottom of left foot.  Pt also reports a painful broken bottom tooth on the right side of mouth.  Pt recently suffered a break up with girlfriend also.  Pt lives alone in a mobile home and ambulates with a cane.  He will need an order for his cane as well as for his shoes with shoestrings so that he can ambulate.  Pt denies suicidal ideation at present but stated that last week when he realized that he would have to come back into hospital he had fleeting suicidal ideation.   Pt lists his mother [name] as his emergency contact.  She may be reached at [phone]

## 2011-08-02 NOTE — Progress Notes (Signed)
D.  Pt bright on approach, attended evening AA group with good participation.  Pt requested bandage change to his left foot.  Denies SI/HI/hallucinaitons.  Still reports quite a bit of knee pain, states that walking so much has irritated it today. Pt also expressed concern that perhaps his foot might be slightly swollen.  A.  Bandage replaced with band aid to give foot more open air, wound cleaned, no swelling noted to foot.  Ulcer is scabbed over with some slight drainage noted on previous bandage and on Pt's sock from night before.  No s/s of infection noted.  R.  Pt heading to room to prepare for bed, no further complaints voiced although he would like a stronger pain medication than the Ibuprofen he has ordered.  Support and encouragement offered, will continue to monitor.

## 2011-08-02 NOTE — Progress Notes (Signed)
Psychoeducational Group Note  Date:  08/02/2011 Time:  0930  Group Topic/Focus:  Spirituality:   The focus of this group is to discuss how one's spirituality can aide in recovery.  Participation Level:  Minimal  Participation Quality:  Appropriate  Affect:  Appropriate and Flat  Cognitive:  Alert and Appropriate  Insight:  Limited  Engagement in Group:  Limited  Additional Comments:  Pt attended group but his participation was limited as he only spoke at the very end reading an inspirational quote out loud. Pt gave no feed back or discussion on this.   Dalia Heading 08/02/2011, 1:39 PM

## 2011-08-02 NOTE — ED Notes (Signed)
Pt transported to [facility] via [facility]. 

## 2011-08-02 NOTE — Progress Notes (Signed)
Patient ID: [name], male   DOB: 19-Oct-1965, 46 y.o.   MRN: [id number]  [facility] Group Notes:  (Counselor/Nursing/MHT/Case Management/Adjunct)  08/02/2011 1:15 PM  Type of Therapy:  Group Therapy, Dance/Movement Therapy   Participation Level:  Minimal  Participation Quality:  Appropriate  Affect:  Appropriate  Cognitive:  Appropriate  Insight:  Limited  Engagement in Group:  Limited  Engagement in Therapy:  Limited  Modes of Intervention:  Clarification, Problem-solving, Role-play, Socialization and Support  Summary of Progress/Problems: Therapist discussed the role supports have in our lives and why supports are needed in the recovery process.  Therapist used the metaphor of a tool box and how various tools are needed to build a strong foundation. Therapist processed with group  types of support systems needed in order to prevent relapse. Therapist asked group what tools do you need in order to maintain your sobriety.  Pt. stated "Meeting are important.  Church, family and friends.  I have had 5 months of sobriety.  I just need to remember go back to doing what it took to maintain that again".   Rhunette Croft 08/02/2011. 4:49 PM

## 2011-08-03 DIAGNOSIS — F1910 Other psychoactive substance abuse, uncomplicated: Secondary | ICD-10-CM

## 2011-08-03 DIAGNOSIS — L97509 Non-pressure chronic ulcer of other part of unspecified foot with unspecified severity: Secondary | ICD-10-CM

## 2011-08-03 MED ORDER — DULOXETINE HCL 30 MG PO CPEP
30.0000 mg | ORAL_CAPSULE | ORAL | Status: AC
  Administered 2011-08-03: 30 mg via ORAL
  Filled 2011-08-03 (×2): qty 1

## 2011-08-03 MED ORDER — COLLAGENASE 250 UNIT/GM EX OINT
TOPICAL_OINTMENT | Freq: Every day | CUTANEOUS | Status: DC
  Administered 2011-08-04 – 2011-08-05 (×2): 1 via TOPICAL
  Administered 2011-08-06 – 2011-08-07 (×2): via TOPICAL
  Filled 2011-08-03: qty 30

## 2011-08-03 MED ORDER — DULOXETINE HCL 20 MG PO CPEP
20.0000 mg | ORAL_CAPSULE | Freq: Every day | ORAL | Status: DC
  Administered 2011-08-04: 20 mg via ORAL
  Filled 2011-08-03 (×3): qty 1

## 2011-08-03 NOTE — Discharge Planning (Signed)
Returning patient states he recently relapsed due to break up of relationship.  Admits he did not attend AA mtgs as was his identified sobriety plan.  Interested in [place].  Told him it was not an option as he is having problems with ambulation.  He reluctantly signed a release for [facility].

## 2011-08-03 NOTE — Progress Notes (Signed)
Psychoeducational Group Note  Date:  08/03/2011 Time:  1100  Group Topic/Focus:  Self Care:   The focus of this group is to help patients understand the importance of self-care in order to improve or restore emotional, physical, spiritual, interpersonal, and financial health.  Participation Level:  Active  Participation Quality:  Appropriate and Supportive  Affect:  Appropriate  Cognitive:  Appropriate  Insight:  Good  Engagement in Group:  Good  Additional Comments:  none  [name] M 08/03/2011, 2:33 PM

## 2011-08-03 NOTE — Progress Notes (Signed)
[facility] MD Progress Note  08/03/2011 10:30 PM  S/O: Patient seen and evaluated in team. Chart reviewed. Patient stated that his mood was "not too good". Recently broke up with GF. His affect was mood congruent and anxious. He denied any current thoughts of self injurious behavior, suicidal ideation or homicidal ideation. Sig pain noted in foot, knees and back. There were no auditory or visual hallucinations, paranoia, delusional thought processes, or mania noted.  Thought process was linear and goal directed.  No psychomotor agitation or retardation was noted. His speech was normal rate, tone and volume. Eye contact was good. Judgment and insight are fair.  Patient has been up and engaged on the unit, walking with cane.  No acute safety concerns reported from team.  Sleep:  Number of Hours: 6    Vital Signs:Blood pressure 120/76, pulse 77, temperature 97.9 F (36.6 C), temperature source Oral, resp. rate 16, height 6\' 3"  (1.905 m), weight 78.926 kg (174 lb).  Lab Results: No results found for this or any previous visit (from the past 48 hour(s)).  Physical Findings: CIWA:  CIWA-Ar Total: 3  COWS:  COWS Total Score: 0   A/P: Alcohol Dependence; Cocaine Abuse; SIMD; Chronic Pain; Insomnia  Pt seen and evaluated in treatment team.  Reviewed short term and long term goals, medications, current treatment in the hospital and acute/chronic safety.  Pt denied any current thoughts of self harm, suicidal ideation or homicidal ideation.  Contracted for safety on the unit.  No acute issues noted.  VS reviewed with team.  Pt agreeable with treatment plan, see orders.  Initiated Cymbalta for depressive s/s and pain.  Continue Motrin for pain as well and Trazodone for sleep.  Wound care being re-consulted.  Lupe Carney 08/03/2011, 10:30 PM

## 2011-08-03 NOTE — Progress Notes (Signed)
Pt was active and participated in a therapeutic activity. Staff assisted pt's in playing the game apple's to apple's. Pt appeared to have enjoyed the game by laughing and interacting with staff and other pt's.  

## 2011-08-03 NOTE — Progress Notes (Signed)
[facility] Group Notes:  (Counselor/Nursing/MHT/Case Management/Adjunct)  08/03/2011 3:02 PM  Type of Therapy:  Group Therapy  Participation Level:  Active  Participation Quality:  Appropriate  Affect:  Appropriate  Cognitive:  Appropriate  Insight:  Good  Engagement in Group:  Good  Engagement in Therapy:  Good  Modes of Intervention:  Problem-solving and support  Summary of Progress/Problems:The focus of this group session was to process how to understand and manage change in efforts to overcome obstacles or road blocks. [name] identified road blocks to his recovery to be his pattern of trying multiple times to overcome addiction.   Lilia Pro 08/03/2011, 3:02 PM

## 2011-08-03 NOTE — Treatment Plan (Signed)
Interdisciplinary Treatment Plan Update (Adult)  Date: 08/03/2011  Time Reviewed: 11:55 AM   Progress in Treatment: Attending groups: Yes Participating in groups: Yes Taking medication as prescribed: Yes Tolerating medication: Yes   Family/Significant other contact made:  No Patient understands diagnosis:  Yes  As evidenced by asking for help with detox, getting into rehab Discussing patient identified problems/goals with staff:  Yes  See below Medical problems stabilized or resolved:  Yes Denies suicidal/homicidal ideation: Yes  In tx team Issues/concerns per patient self-inventory:  Yes  Depression and hopelessness are 7's,  C/O poor sleep.  Pain is a 10 [knee and foot]   Other:  New problem(s) identified: N/A  Reason for Continuation of Hospitalization: Depression Medication stabilization Withdrawal symptoms  Interventions implemented related to continuation of hospitalization: Librium taper, encourage group attendance and participation,  Call for wound care consult, CM to refer to [facility]  Additional comments:  Estimated length of stay: 2-3 days  Discharge Plan: Possibly [facility]  New goal(s): N/A  Review of initial/current patient goals per problem list:   1.  Goal(s):Safely detox from alcohol  Met:  No  Target date:7/17  As evidenced WU:[id number] vitals, no withdrawal symptoms  2.  Goal (s): Eliminate SI  Met:  Yes  Target date:7/15  As evidenced YN:WGNF report in tx team  3.  Goal(s): Identify comprehensive sobriety plan  Met:  Yes  Target date:7/17  As evidenced AO:ZHYQ is willing to consider referral to [facility]; will pursue  4.  Goal(s):Address medical issues related to diabetes  Met:  No  Target date:7/16  As evidenced MV:HQION will request Thoma be seen by wound care  Attendees: Patient: [name] 08/03/2011 11:55 AM  Family:     Physician:  Lupe Carney 08/03/2011 11:55 AM   Nursing: Robbie Louis   08/03/2011 11:55 AM   Case Manager:   Richelle Ito, LCSW 08/03/2011 11:55 AM   Counselor:  Clarice Pole 08/03/2011 11:55 AM   Other:     Other:     Other:     Other:      Scribe for Treatment Team:   Ida Rogue, 08/03/2011 11:55 AM

## 2011-08-03 NOTE — Progress Notes (Signed)
Patient ID: [name], male   DOB: 10/16/1965, 46 y.o.   MRN: [id number] He has been up and to AM group. Per self inventory: depressed at 7, hopeless 7, denies SI thoughts , Withdrawal symptoms of tremors and agitation, Has requested and received PRN  medication for W/D symptoms, pain, and agitation. Dry dressing reapplied to bottom of left foot. No drainage noted at this time.

## 2011-08-03 NOTE — H&P (Signed)
Cosigned for Dr. Jonnalagadda. 

## 2011-08-03 NOTE — Consult Note (Signed)
WOC consult Note Reason for Consult: Consult requested for left foot wound.  Pt states this is a chronic wound which has been followed by Dr Duda since January.  He had previously prescribed Santyl for chemical debridement but pt ran out and has not been using recently.  He had a follow-up appointment scheduled for 7/17 but will miss it R/T [facility] admission. Wound type: Chronic full thickness Measurement: Left plantar foot near great toe area .5X.5cm, dark brown, small tan drainage, no odor, wound bed surrounded by callous to 1cm.  No fluctuance when probed with a swab.   Left heel with dry callous approx 4X4cm, intact skin.  Pt states he receives serial debridements to both callous areas when he is attending his ortho appointments. Dressing procedure/placement/frequency: Continue Santyl ointment which was previously ordered by Dr Duda.  Pt can resume follow-up after discharge with this service. Will not plan to follow further unless re-consulted.  Thank-you  Promiss Labarbera, RN, MSN, CWOCN  [phone]  

## 2011-08-03 NOTE — Consult Note (Signed)
WOC consult Note Reason for Consult: Consult requested for left foot wound.  Pt states this is a chronic wound which has been followed by Dr Lajoyce Corners since January.  He had previously prescribed Santyl for chemical debridement but pt ran out and has not been using recently.  He had a follow-up appointment scheduled for 7/17 but will miss it R/[facility] admission. Wound type: Chronic full thickness Measurement: Left plantar foot near great toe area .5X.5cm, dark brown, small tan drainage, no odor, wound bed surrounded by callous to 1cm.  No fluctuance when probed with a swab.   Left heel with dry callous approx 4X4cm, intact skin.  Pt states he receives serial debridements to both callous areas when he is attending his ortho appointments. Dressing procedure/placement/frequency: Continue Santyl ointment which was previously ordered by Dr Lajoyce Corners.  Pt can resume follow-up after discharge with this service. Will not plan to follow further unless re-consulted.  [address], RN, MSN, [employer]  [phone]

## 2011-08-04 MED ORDER — QUETIAPINE FUMARATE 25 MG PO TABS
25.0000 mg | ORAL_TABLET | Freq: Two times a day (BID) | ORAL | Status: DC
  Administered 2011-08-04 – 2011-08-05 (×2): 25 mg via ORAL
  Filled 2011-08-04 (×8): qty 1

## 2011-08-04 MED ORDER — DULOXETINE HCL 30 MG PO CPEP
30.0000 mg | ORAL_CAPSULE | Freq: Every day | ORAL | Status: DC
  Administered 2011-08-05 – 2011-08-07 (×3): 30 mg via ORAL
  Filled 2011-08-04 (×5): qty 1

## 2011-08-04 NOTE — Progress Notes (Signed)
[facility] MD Progress Note   S/O: Patient seen and evaluated in team. Chart reviewed. Patient stated that his mood was "not too good". Recently broke up with GF. His affect was mood congruent and anxious. He denied any current thoughts of self injurious behavior, suicidal ideation or homicidal ideation. Sig pain noted in foot, knees and back. There were no auditory or visual hallucinations, paranoia, delusional thought processes, or mania noted.  Thought process was linear and goal directed.  No psychomotor agitation or retardation was noted. His speech was normal rate, tone and volume. Eye contact was good. Judgment and insight are fair.  Patient has been up and engaged on the unit, walking with cane.  No acute safety concerns reported from team.  Lab Results: No results found for this or any previous visit (from the past 48 hour(s)).  Physical Findings: CIWA:  CIWA-Ar Total: 3  COWS:  COWS Total Score: 0   A/P: Alcohol Dependence; Cocaine Abuse; SIMD; Chronic Pain; Insomnia  Pt seen and evaluated in treatment team.  Reviewed short term and long term goals, medications, current treatment in the hospital and acute/chronic safety.  Pt denied any current thoughts of self harm, suicidal ideation or homicidal ideation.  Contracted for safety on the unit.  No acute issues noted. VS reviewed with team.  Pt agreeable with treatment plan, see orders. Exploring [facility] for further Tx s/p acute stabilization.
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
## 2011-08-04 NOTE — Progress Notes (Signed)
Psychoeducational Group Note  Date:  08/04/2011 Time:  1100  Group Topic/Focus:  Recovery Goals:   The focus of this group is to identify appropriate goals for recovery and establish a plan to achieve them.  Participation Level:  Active  Participation Quality:  Appropriate, Attentive and Sharing  Affect:  Appropriate  Cognitive:  Appropriate  Insight:  Good  Engagement in Group:  Good  Additional Comments: Pt participated in the group of Recovery Goals. Pt was appropriate during group and was sharing information on what recovery was and what life was when in recovery. Pt stated two things that are motivated and willing to change. Pt also came up with goals in ways to be positive when times begin to become unhealthy.   Karleen Hampshire Brittini 08/04/2011, 4:25 PM

## 2011-08-04 NOTE — Progress Notes (Signed)
08/04/2011         Time: 1500      Group Topic/Focus: The focus of this group is on discussing various styles of communication and communicating assertively using 'I' (feeling) statements.   Participation Level: Active  Participation Quality: Appropriate and Attentive  Affect: Appropriate  Cognitive: Oriented   Additional Comments: None.    Jemia Fata 08/04/2011 3:52 PM 

## 2011-08-04 NOTE — Progress Notes (Signed)
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 Keywon participated in an activity labeling self and peers and choose to label self as a Personnel officer for the activity. Sabre was quiet but attentive while group discussed what labels are, how we use them, how they are helpful and harmful, and listed positive and negative labels they have used or been called. Tredarius was given a homework assignment to list 10 ways he has been labeled to find the reality of the situation/label.    Wandra Scot 08/04/2011, 1:38 PM

## 2011-08-04 NOTE — Progress Notes (Signed)
Pt has been up for all groups today.  He reported sleep is well and energy good.  He denies any symptoms of withdrawal.  He rated both his depression and hopelessness a 3 and his anxiety 7 on his self-inventory.  He has request pain medication and vistaril for his "anxiety" 2-3 times today.  She daily cares for those results and pain tab.  His librium prn was d/c'd today for pt has denied any symptoms of withdrawal.  He is hoping to go to a 28 day program which may be [facility].  He did write on his inventory that his sister has been calling [facility] [place] [facility] treatment facilities but stated later today, "She has not had any luck finding him a place".  He has denied any S/H ideation.  He was started on seroquel 25 mg today which he reported felt it was helpful for him.  His cymbalta  has been increased to 30 mg to start tomorrow and CMET ordered.  Wound care completed as ordered this morning.  No drainage noted and pt tolerated "well".

## 2011-08-04 NOTE — Discharge Planning (Signed)
Sent application to [facility]. 

## 2011-08-04 NOTE — Progress Notes (Signed)
[facility] Group Notes:  (Counselor/Nursing/MHT/Case Management/Adjunct)  08/04/2011 8:33 PM  Type of Therapy:  Group Therapy at 1:15 to 2:30  Participation Level:  Minimal  Participation Quality:  Drowsy  Affect:  Depressed  Cognitive:  Oriented  Insight:  None shared  Engagement in Group:  Limited  Engagement in Therapy:  Limited  Modes of Intervention:  Education, Socialization and Support  Summary of Progress/Problems: Keigo was attentive to majority of presentation by Interior and spatial designer of [facility] ([facility]).  About the half way point of group Morrill was noticeably drowsy and soon nodded off.   Clide Dales 08/04/2011, 8:33 PM

## 2011-08-04 NOTE — Progress Notes (Signed)
Patient ID: [name], male   DOB: 1965-10-30, 46 y.o.   MRN: [id number]  D:  Pt was concerned and asked questions about meds that he was informed he would be getting.  Informed pt that writer had spoken to and received order for cymbalta 30 mg to be given with first dose tonight. However, later in the shift writer observed that Dr had written an order for cymbalta 20 mg starting on the 16th. Informed the pt of the order and that there was no increase in ibuprofen. Encouraged pt to speak with his dr in the morning.  A: Pt verbalized understanding of situation.  Continue 15 minute checks for safety.   R:  Pt remains safe.

## 2011-08-05 DIAGNOSIS — F1020 Alcohol dependence, uncomplicated: Secondary | ICD-10-CM | POA: Diagnosis present

## 2011-08-05 LAB — COMPREHENSIVE METABOLIC PANEL
Alkaline Phosphatase: 68 U/L (ref 39–117)
BUN: 6 mg/dL (ref 6–23)
CO2: 24 mEq/L (ref 19–32)
GFR calc Af Amer: >90 mL/min (ref >90)
GFR calc non Af Amer: >90 mL/min (ref >90)
Glucose, Bld: 109 mg/dL — ABNORMAL HIGH (ref 70–99)
Potassium: 3.6 mEq/L (ref 3.5–5.1)
Total Bilirubin: 0.4 mg/dL (ref 0.3–1.2)
Total Protein: 6.4 g/dL (ref 6.0–8.3)

## 2011-08-05 MED ORDER — CELECOXIB 200 MG PO CAPS
200.0000 mg | ORAL_CAPSULE | Freq: Every day | ORAL | Status: DC
  Administered 2011-08-05 – 2011-08-07 (×3): 200 mg via ORAL
  Filled 2011-08-05 (×4): qty 1
  Filled 2011-08-05: qty 2

## 2011-08-05 MED ORDER — QUETIAPINE FUMARATE 25 MG PO TABS
25.0000 mg | ORAL_TABLET | Freq: Three times a day (TID) | ORAL | Status: DC
  Filled 2011-08-05: qty 1

## 2011-08-05 MED ORDER — QUETIAPINE FUMARATE 25 MG PO TABS
25.0000 mg | ORAL_TABLET | Freq: Three times a day (TID) | ORAL | Status: DC
  Administered 2011-08-05 – 2011-08-07 (×5): 25 mg via ORAL
  Filled 2011-08-05 (×9): qty 1
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
## 2011-08-05 NOTE — Progress Notes (Signed)
Pt came to medication widow for meds this morning and at first agreed to take his Seroquel and then decided to refuse and take the prn vistaril.  Pt's vistaril was d/c'd and he was angry that the vistaril was not ordered.  He then changed his mind and decided to take the Seroquel after all.  He rated his depression and hopelessness both a 3 and his anxiety a 7 on his self-inventory.  He has request prn ibuprofen 2 times today last being at 1451.  He plans to ask the doctor for more pain medication today.  He is planning to go to [facility] once he is ready for discharge from here.
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
## 2011-08-06 LAB — HEMOGLOBIN A1C: Hgb A1c MFr Bld: 5.4 % (ref <5.7)

## 2011-08-06 NOTE — Discharge Planning (Signed)
11:29 AM 08/06/2011   SW met with pt. in discharge planning group.  SW discussed plans to transition to [facility].  SW discussed transition.  SW discussed options if there is no bed available soon and there is a waiting period.    SW contacted [facility].  SW was informed by [facility] staff that there is a bed available and pt. scheduled for intake at 10am tomorrow.   Clarice Pole, LCASA 08/06/2011, 11:29 AM

## 2011-08-06 NOTE — Progress Notes (Signed)
Psychoeducational Group Note  Date:  08/05/2011 Time:  2000  Group Topic/Focus:  AA  Participation Level:  Active  Participation Quality:  Appropriate  Affect:  Appropriate  Cognitive:  Alert  Insight:  Limited  Engagement in Group:  Good  Additional Comments:  Pt attended AA this evening.  Kaleen Odea R 08/06/2011, 12:24 AM

## 2011-08-06 NOTE — Progress Notes (Signed)
Read and reviewed. 

## 2011-08-06 NOTE — Progress Notes (Signed)
[facility] Family/Significant Other Suicide Prevention Education  Suicide Prevention Education:  Patient Discharged to Other Healthcare Facility:  Suicide Prevention Education Not Provided: {PT. DISCHARGED TO OTHER HEALTHCARE FACILITY:SUICIDE PREVENTION EDUCATION NOT PROVIDED ([facility]):  The patient is discharging to another healthcare facility for continuation of treatment.  The patient's medical information, including suicide ideations and risk factors, are a part of the medical information shared with the receiving healthcare facility.  Suicidal ideation was noted on patient's care plan although no notations of any thought was found in admit notes. Weekend counseling staff provided suicide prevention education to patient's mother.   Clide Dales 08/06/2011, 8:36 PM

## 2011-08-06 NOTE — Tx Team (Signed)
Interdisciplinary Treatment Plan Update (Adult) Date: 08/06/2011  Time Reviewed: 11:01 AM  Progress in Treatment: Attending groups: Yes Participating in groups: Yes Taking medication as prescribed: Yes Tolerating medication: Yes Family/Significant othe contact made:  Patient understands diagnosis: Yes Discussing patient identified problems/goals with staff: Yes Medical problems stabilized or resolved: Yes Denies suicidal/homicidal ideation: Yes, in treatment team Issues/concerns per patient self-inventory: None identified Other: N/A New problem(s) identified: None Identified  Reason for Continuation of Hospitalization: Medication stabilization  Interventions implemented related to continuation of hospitalization: mood stabilization, medication monitoring and adjustment, group therapy and psycho education, safety checks q 15 mins  Additional comments: N/A  Estimated length of stay: discharge 08-07-11  Discharge Plan: d/c tomorrow  New goal(s): N/A Review of initial/current patient goals per problem list:  1. Goal(s): Address substance use  Met: Yes  Target date: by discharge  As evidenced by: completed detox protocol and referred to appropriate treatment  [facility] will be able to accept pt. For intake tomorrow at 10am. 2. Goal (s): Reduce anxiety/depressive symptoms  Met: Yes  Target date: by discharge  As evidenced by: Reducing depression from a 10 to a 3 as reported by pt.  3. Goal(s): Link and Refer for appointment  Met:   Target date: by discharge  As evidenced by:  Pt. Accepted by [facility] Attendees: Patient: [name] 08/06/2011 11:12 AM  Family:    Physician: Lupe Carney, DO 08/06/2011 11:01 AM  Nursing: Roswell Miners, RN 08/06/2011 11:01 AM  Case Manager: Clarice Pole, LCASA 08/06/2011 11:01 AM  Counselor: Ronda Fairly, LCSWA 08/06/2011 11:01 AM   Other: Richelle Ito, LCSW 08/06/2011 11:01 AM   Other: Nestor Ramp, RN 08/06/2011 11:12 AM  Other:     Other:    Scribe for Treatment Team:  Clarice Pole 08/06/2011 11:01 AM

## 2011-08-06 NOTE — Progress Notes (Signed)
D: Pt denies SI/HI/AVH. PT rates his anxiety as 5 and depression and hopelessness as 1. Pt has been visible on unit and has been attending groups. Pt has initiated minimal contact but is assertive when necessary. Pt has had minimal complaints. His main issue today has been his foot pain. A: Support and encouragement offered to pt. Continue Q15 min checks for safety. Medicated pt for foot pain. R: Pt receptive. Pt remains safe on unit. Pt reports decreased pain in foot.

## 2011-08-06 NOTE — Progress Notes (Addendum)
[facility] Group Notes:  (Counselor/Nursing/MHT/Case Management/Adjunct)  08/06/2011 8:29 PM  Type of Therapy:  Group Therapy 1:15 to 2:30  Participation Level:  Minimal  Participation Quality:  Appropriate  Affect:  Drowsy  Cognitive:  Appropriate  Insight:  Limited  Engagement in Group:  Limited  Engagement in Therapy:  Limited  Modes of Intervention:  Activity, Clarification and Support  Summary of Progress/Problems: Focus of group processing discussion was on balance in life; the components in life which have a negative influence on balance and the components which make for a more balanced life.  Patient shared  Phot of geometric bridge to represent balance.  [name] was drowsy and nodded off at several points.   [name] 08/06/2011, 8:34 PM

## 2011-08-06 NOTE — [facility] Suicide Risk Assessment (Signed)
Suicide Risk Assessment  Discharge Assessment    Demographic factors: Caucasian   Current Mental Status Per Nursing Assessment: At Discharge: Pt denied any SI/HI/thoughts of self harm or acute psychiatric issues in treatment team with clinical, nursing and medical team present.   Current Mental Status Per Physician: Patient seen and evaluated in team. Chart reviewed. Patient stated that his mood was "better". His affect was mood congruent and euthymic. Pt requesting to be discharged tomorrow and has been accepted to [facility].  He denied any current thoughts of self injurious behavior, suicidal ideation or homicidal ideation. There were no auditory or visual hallucinations, paranoia, delusional thought processes, or mania noted. Thought process was linear and goal directed. No psychomotor agitation or retardation was noted. His speech was normal rate, tone and volume. Eye contact was good. Judgment and insight are fair. Patient has been up and engaged on the unit. No acute safety concerns reported from team.   Loss Factors: Legal issues,disability   Historical Factors: No reported hx SIB/attempts; SI upon admission   Risk Reduction Factors: Sense of responsibility to family (parents, son and sister); willing to f/u with [facility]; family can drive him there in am  Discharge Diagnoses: Alcohol Dependence; Cocaine Abuse; SIMD  Cognitive Features That Contribute To Risk: limited insight.   Suicide Risk: Patient is currently viewed as a chronic moderate isk of harm to himself and others in light of his history and risk factors. There are no acute safety concerns and he is stable for discharge. His continued sobriety, medication management and followup will mitigate against any potential increased risk in the future. He is agreeable with the plan. Discussed with the team.   Plan Of Care/Follow-up recommendations: Pt seen and evaluated in treatment team. Chart reviewed. Pt stable for and requesting  discharge to [facility]. Pt contracting for safety and does not currently meet [place] involuntary commitment criteria for continued hospitalization against his will. Continued sobriety will mitigate against the potential increased risk of harm to self and/or others. Discussed the importance of recovery further with pt, as well as, tools to move forward in a healthy & safe manner. Pt agreeable with the plan. Discussed with the team. Please see orders, follow up appointments per AVS and full discharge summary to be completed. Recommend follow up with AA/NA. Diet: Diabetic. Activity: As tolerated.   [name] 08/06/2011, 4:55 PM

## 2011-08-06 NOTE — Progress Notes (Signed)
Psychoeducational Group Note  Date:  08/06/2011 Time:  1100  Group Topic/Focus:  Building Self Esteem:   The Focus of this group is helping patients become aware of the effects of self-esteem on their lives, the things they and others do that enhance or undermine their self-esteem, seeing the relationship between their level of self-esteem and the choices they make and learning ways to enhance self-esteem.  Participation Level:  Did Not Attend   Additional Comments:  Pt did not attend group because he was in a consult with staff.  Sharyn Lull 08/06/2011, 2:11 PM

## 2011-08-06 NOTE — Progress Notes (Signed)
D   Pt in bed resting with eyes closed  No distress noted   Respirations are normal A   Q 15 min checks   Will continue to monitor R   Pt safe at present

## 2011-08-07 MED ORDER — DULOXETINE HCL 30 MG PO CPEP: 30.0000 mg | ORAL_CAPSULE | Freq: Every day | ORAL | Status: DC

## 2011-08-07 MED ORDER — TRAZODONE HCL 100 MG PO TABS: 200.0000 mg | ORAL_TABLET | Freq: Every day | ORAL | Status: DC

## 2011-08-07 MED ORDER — QUETIAPINE FUMARATE 25 MG PO TABS: 25.0000 mg | ORAL_TABLET | Freq: Three times a day (TID) | ORAL | Status: DC

## 2011-08-07 MED ORDER — CELECOXIB 200 MG PO CAPS: 200.0000 mg | ORAL_CAPSULE | Freq: Every day | ORAL | Status: AC

## 2011-08-07 NOTE — Progress Notes (Signed)
[facility] Case Management Discharge Plan:  Will you be returning to the same living situation after discharge: No. At discharge, do you have transportation home?:Yes,  family Do you have the ability to pay for your medications:Yes,  mental health  Interagency Information:     Release of information consent forms completed and in the chart;  Patient's signature needed at discharge.  Patient to Follow up at:  Follow-up Information    Follow up with [facility] on 08/07/2011. (Will be picked up at 10:00 am to be transported there)    Contact information:   [address]  [place], [place] [address] [phone]         Patient denies SI/HI:   Yes,  yes    Safety Planning and Suicide Prevention discussed:  Yes,  yes  Barrier to discharge identified:No.  Summary and Recommendations:   Ida Rogue 08/07/2011, 9:28 AM

## 2011-08-07 NOTE — Discharge Summary (Signed)
Physician Discharge Summary Note  Patient:  [name] is an 46 y.o., male MRN:  [id number] DOB:  04-24-1965 Patient phone:  [phone] (home)  Patient address:   [address] [place] [address]   Date of Admission:  08/02/2011 Date of Discharge: 08/07/2011  Discharge Diagnoses: Principal Problem:  *Alcohol dependence  Axis Diagnosis:   Discharge Diagnoses: Alcohol Dependence; Cocaine Abuse; SIMD  Level of Care:  [facility]  Hospital Course:   Brianna was admitted for relapsing on alcohol and cocaine.  He was treated with the Librium detox protocol and his health issues were addressed.  His foot ulcer treatment was continued.  He was also assessed for fasting hyperglycemia.  Sundeep expressed concern that he wanted residential treatment upon completion of his detox.  He was evaluated daily by a clinician.  Alvis's response to detox was monitored with CIWA scores and he was also asked to complete a daily self assessment to monitor his mental and emotional status.  Unit programming consisted of AA, group and individual counseling.  He was started on Celebrex for his joint pain and Cymbalta for depression.  Dionisio voiced no side effects to either medication.  Nickalus was evaluated by the treatment team and requested discharge to [facility].  Upon discharge he was in much improved condition than upon arrival.  He was medically and mentally stable upon discharge.  Follow-up Information    Follow up with [facility] on 08/07/2011. (Will be picked up at 10:00 am to be transported there)    Contact information:   [address]  [place], [place] [address] [phone]        Upon discharge, patient adamantly denies suicidal, homicidal ideations, auditory, visual hallucinations and or delusional thinking. They left [facility] with all personal belongings via private transportation in no apparent distress.  Consults:  None  Significant Diagnostic Studies:  labs:   Discharge Vitals:   Blood pressure 130/87, pulse 109,  temperature 97.4 F (36.3 C), temperature source Oral, resp. rate 18, height 6\' 3"  (1.905 m), weight 78.926 kg (174 lb)..  Mental Status Exam: See Mental Status Examination and Suicide Risk Assessment completed by Attending Physician prior to discharge.  Discharge destination:  [facility]  Is patient on multiple antipsychotic therapies at discharge:  No  Has Patient had three or more failed trials of antipsychotic monotherapy by history: N/A Recommended Plan for Multiple Antipsychotic Therapies: N/A Discharge Orders    Future Orders Please Complete By Expires   Diet - low sodium heart healthy      Increase activity slowly      Discharge instructions      Comments:   Take all of your medications as prescribed.  Be sure to keep ALL follow up appointments as scheduled. This is to ensure getting your refills on time to avoid any interruption in your medication.  If you find that you can not keep your appointment, call the clinic and reschedule. Be sure to tell the nurse if you will need a refill before your appointment.     Medication List  As of 08/07/2011 10:04 AM   STOP taking these medications         oxyCODONE-acetaminophen 5-325 MG per tablet         TAKE these medications      Indication    celecoxib 200 MG capsule   Commonly known as: CELEBREX   Take 1 capsule (200 mg total) by mouth daily. For osteoarthritis pain.    Indication: Joint Damage causing  Pain and Loss of Function      DULoxetine 30 MG capsule   Commonly known as: CYMBALTA   Take 1 capsule (30 mg total) by mouth daily. For anxiety and depression.    Indication: Generalized Anxiety Disorder, Major Depressive Disorder      QUEtiapine 25 MG tablet   Commonly known as: SEROQUEL   Take 1 tablet (25 mg total) by mouth 3 (three) times daily. For anxiety.       traZODone 100 MG tablet   Commonly known as: DESYREL   Take 2 tablets (200 mg total) by mouth at bedtime. For sleep       traZODone 100 MG tablet   Commonly  known as: DESYREL   Take 2 tablets (200 mg total) by mouth at bedtime. For insomnia.    Indication: Trouble Sleeping           Follow-up Information    Follow up with [facility] on 08/07/2011. (Will be picked up at 10:00 am to be transported there)    Contact information:   [address]  [place], [place] [address] [phone]        Follow-up recommendations:   Activities: Resume typical activities Diet: Resume typical diet Other: Follow up with outpatient provider and report any side effects to out patient prescriber.  Comments:  Take all your medications as prescribed by your mental healthcare provider. Report any adverse effects and or reactions from your medicines to your outpatient provider promptly. Patient is instructed and cautioned to not engage in alcohol and or illegal drug use while on prescription medicines. In the event of worsening symptoms, patient is instructed to call the crisis hotline, 911 and or go to the nearest ED for appropriate evaluation and treatment of symptoms.  Signed: Rona Ravens. Zivah Mayr PAC For Dr. Lupe Carney 08/07/2011 10:04 AM

## 2011-08-07 NOTE — Progress Notes (Signed)
D: Pt in bed resting with eyes closed. Respirations even and unlabored. Pt appears to be in no signs of distress at this time. A: Q15min checks remains for this pt. R: Pt remains safe at this time.   

## 2011-08-07 NOTE — Progress Notes (Signed)
Pt. discharged from unit today. Pt.pleasant upon discharge and is to follow up with [facility] program today.  Discharge instructions, follow up and prescriptions reviewed with patient with voiced understanding given.  Denies SI/HI and A/V hallucinations.  Pt picked up by his parents and his sister.  He received all belongings out of his locker upon leaving.

## 2011-08-10 NOTE — Progress Notes (Signed)
Patient Discharge Instructions:  After Visit Summary (AVS):   Faxed to:  08/10/2011 Psychiatric Admission Assessment Note:   Faxed to:  08/10/2011 Suicide Risk Assessment - Discharge Assessment:   Faxed to:  08/10/2011 Faxed/Sent to the Next Level Care provider:  08/10/2011  Faxed to [facility] @ [phone]  Wandra Scot, 08/10/2011, 4:56 PM

## 2011-10-26 ENCOUNTER — Encounter (HOSPITAL_COMMUNITY): Payer: Self-pay | Admitting: Pharmacy Technician

## 2011-10-28 ENCOUNTER — Encounter (HOSPITAL_COMMUNITY)
Admission: RE | Admit: 2011-10-28 | Discharge: 2011-10-28 | Disposition: A | Discharge status: Home / Self Care | Payer: Medicare Other | Source: Ambulatory Visit | Attending: Orthopedic Surgery | Admitting: Orthopedic Surgery

## 2011-10-28 ENCOUNTER — Encounter (HOSPITAL_COMMUNITY): Payer: Self-pay

## 2011-10-28 HISTORY — DX: Other injury of unspecified body region, initial encounter: T14.8XXA

## 2011-10-28 HISTORY — DX: Unspecified osteoarthritis, unspecified site: M19.90

## 2011-10-28 HISTORY — DX: Anxiety disorder, unspecified: F41.9

## 2011-10-28 LAB — SURGICAL PCR SCREEN: Staphylococcus aureus: NEGATIVE

## 2011-10-28 LAB — CBC
HCT: 44.5 % (ref 39.0–52.0)
Hemoglobin: 14.7 g/dL (ref 13.0–17.0)
MCH: 27.7 pg (ref 26.0–34.0)
MCHC: 33 g/dL (ref 30.0–36.0)
MCV: 84 fL (ref 78.0–100.0)
RDW: 16.8 % — ABNORMAL HIGH (ref 11.5–15.5)

## 2011-10-28 LAB — BASIC METABOLIC PANEL
BUN: 3 mg/dL — ABNORMAL LOW (ref 6–23)
Chloride: 98 mEq/L (ref 96–112)
Creatinine, Ser: 0.8 mg/dL (ref 0.50–1.35)
GFR calc Af Amer: >90 mL/min (ref >90)
Glucose, Bld: 116 mg/dL — ABNORMAL HIGH (ref 70–99)
Potassium: 3.4 mEq/L — ABNORMAL LOW (ref 3.5–5.1)

## 2011-10-28 NOTE — Pre-Procedure Instructions (Signed)
20 [name]  10/28/2011   Your procedure is scheduled on:  10/29/11  Report to [facility] at 900 AM.  Call this number if you have problems the morning of surgery: [phone]   Remember:   Do not eat food:After Midnight.   Take these medicines the morning of surgery with A SIP OF WATER: ativan,percocet   Do not wear jewelry, make-up or nail polish.  Do not wear lotions, powders, or perfumes. You may wear deodorant.  Do not shave 48 hours prior to surgery. Men may shave face and neck.  Do not bring valuables to the hospital.  Contacts, dentures or bridgework may not be worn into surgery.  Leave suitcase in the car. After surgery it may be brought to your room.  For patients admitted to the hospital, checkout time is 11:00 AM the day of discharge.   Patients discharged the day of surgery will not be allowed to drive home.  Name and phone number of your driver: family  Special Instructions: Shower using CHG 2 nights before surgery and the night before surgery.  If you shower the day of surgery use CHG.  Use special wash - you have one bottle of CHG for all showers.  You should use approximately 1/3 of the bottle for each shower.   Please read over the following fact sheets that you were given: Pain Booklet, Coughing and Deep Breathing, MRSA Information and Surgical Site Infection Prevention

## 2011-10-29 ENCOUNTER — Encounter (HOSPITAL_COMMUNITY): Payer: Self-pay | Admitting: Anesthesiology

## 2011-10-29 ENCOUNTER — Ambulatory Visit (HOSPITAL_COMMUNITY): Payer: Medicare Other | Admitting: Anesthesiology

## 2011-10-29 ENCOUNTER — Ambulatory Visit (HOSPITAL_COMMUNITY)
Admission: RE | Admit: 2011-10-29 | Discharge: 2011-10-29 | Disposition: A | Discharge status: Home / Self Care | Payer: Medicare Other | Source: Ambulatory Visit | Attending: Orthopedic Surgery | Admitting: Orthopedic Surgery

## 2011-10-29 ENCOUNTER — Other Ambulatory Visit (HOSPITAL_COMMUNITY): Payer: Self-pay | Admitting: Orthopedic Surgery

## 2011-10-29 ENCOUNTER — Encounter (HOSPITAL_COMMUNITY): Payer: Self-pay | Admitting: *Deleted

## 2011-10-29 ENCOUNTER — Encounter (HOSPITAL_COMMUNITY)
Admission: RE | Disposition: A | Discharge status: Home / Self Care | Payer: Self-pay | Source: Ambulatory Visit | Attending: Orthopedic Surgery

## 2011-10-29 DIAGNOSIS — J449 Chronic obstructive pulmonary disease, unspecified: Secondary | ICD-10-CM | POA: Insufficient documentation

## 2011-10-29 DIAGNOSIS — L97509 Non-pressure chronic ulcer of other part of unspecified foot with unspecified severity: Secondary | ICD-10-CM | POA: Insufficient documentation

## 2011-10-29 DIAGNOSIS — E1149 Type 2 diabetes mellitus with other diabetic neurological complication: Secondary | ICD-10-CM | POA: Insufficient documentation

## 2011-10-29 DIAGNOSIS — F17200 Nicotine dependence, unspecified, uncomplicated: Secondary | ICD-10-CM | POA: Insufficient documentation

## 2011-10-29 DIAGNOSIS — J4489 Other specified chronic obstructive pulmonary disease: Secondary | ICD-10-CM | POA: Insufficient documentation

## 2011-10-29 DIAGNOSIS — Z01812 Encounter for preprocedural laboratory examination: Secondary | ICD-10-CM | POA: Insufficient documentation

## 2011-10-29 HISTORY — PX: METATARSAL OSTEOTOMY: SHX1641

## 2011-10-29 LAB — GLUCOSE, CAPILLARY: Glucose-Capillary: 119 mg/dL — ABNORMAL HIGH (ref 70–99)

## 2011-10-29 SURGERY — OSTEOTOMY, METATARSAL BONE
Anesthesia: General | Site: Foot | Laterality: Left | Wound class: Clean

## 2011-10-29 MED ORDER — FENTANYL CITRATE 0.05 MG/ML IJ SOLN
INTRAMUSCULAR | Status: DC | PRN
  Administered 2011-10-29: 50 ug via INTRAVENOUS
  Administered 2011-10-29: 100 ug via INTRAVENOUS
  Administered 2011-10-29: 50 ug via INTRAVENOUS

## 2011-10-29 MED ORDER — LACTATED RINGERS IV SOLN
INTRAVENOUS | Status: DC | PRN
  Administered 2011-10-29 (×2): via INTRAVENOUS

## 2011-10-29 MED ORDER — OXYCODONE HCL 5 MG PO TABS: 5.0000 mg | ORAL_TABLET | Freq: Once | ORAL | Status: DC | PRN

## 2011-10-29 MED ORDER — HYDROMORPHONE HCL PF 1 MG/ML IJ SOLN
INTRAMUSCULAR | Status: AC
  Filled 2011-10-29: qty 1

## 2011-10-29 MED ORDER — HYDROMORPHONE HCL PF 1 MG/ML IJ SOLN
0.2500 mg | INTRAMUSCULAR | Status: DC | PRN
  Administered 2011-10-29 (×2): 0.5 mg via INTRAVENOUS

## 2011-10-29 MED ORDER — PROPOFOL 10 MG/ML IV BOLUS
INTRAVENOUS | Status: DC | PRN
  Administered 2011-10-29: 200 mg via INTRAVENOUS

## 2011-10-29 MED ORDER — LIDOCAINE HCL (CARDIAC) 20 MG/ML IV SOLN
INTRAVENOUS | Status: DC | PRN
  Administered 2011-10-29: 100 mg via INTRAVENOUS

## 2011-10-29 MED ORDER — OXYCODONE-ACETAMINOPHEN 7.5-325 MG PO TABS: 1.0000 | ORAL_TABLET | ORAL | Status: DC | PRN

## 2011-10-29 MED ORDER — ONDANSETRON HCL 4 MG/2ML IJ SOLN
INTRAMUSCULAR | Status: DC | PRN
  Administered 2011-10-29: 4 mg via INTRAVENOUS

## 2011-10-29 MED ORDER — OXYCODONE HCL 5 MG/5ML PO SOLN: 5.0000 mg | Freq: Once | ORAL | Status: DC | PRN

## 2011-10-29 MED ORDER — FENTANYL CITRATE 0.05 MG/ML IJ SOLN: 50.0000 ug | Freq: Once | INTRAMUSCULAR | Status: DC

## 2011-10-29 MED ORDER — MIDAZOLAM HCL 2 MG/2ML IJ SOLN: 1.0000 mg | INTRAMUSCULAR | Status: DC | PRN

## 2011-10-29 MED ORDER — 0.9 % SODIUM CHLORIDE (POUR BTL) OPTIME
TOPICAL | Status: DC | PRN
  Administered 2011-10-29: 1000 mL

## 2011-10-29 MED ORDER — CEFAZOLIN SODIUM-DEXTROSE 2-3 GM-% IV SOLR
INTRAVENOUS | Status: AC
  Filled 2011-10-29: qty 50

## 2011-10-29 MED ORDER — CEFAZOLIN SODIUM-DEXTROSE 2-3 GM-% IV SOLR
2.0000 g | INTRAVENOUS | Status: AC
  Administered 2011-10-29: 2 g via INTRAVENOUS

## 2011-10-29 MED ORDER — PHENYLEPHRINE HCL 10 MG/ML IJ SOLN
INTRAMUSCULAR | Status: DC | PRN
  Administered 2011-10-29 (×3): 80 ug via INTRAVENOUS

## 2011-10-29 SURGICAL SUPPLY — 58 items
BIT DRILL 2.5X110 QC LCP DISP (BIT) ×1 IMPLANT
BLADE AVERAGE 25X9 (BLADE) ×1 IMPLANT
BNDG CMPR 9X4 STRL LF SNTH (GAUZE/BANDAGES/DRESSINGS) ×1
BNDG COHESIVE 1X5 TAN STRL LF (GAUZE/BANDAGES/DRESSINGS) IMPLANT
BNDG COHESIVE 6X5 TAN STRL LF (GAUZE/BANDAGES/DRESSINGS) IMPLANT
BNDG ESMARK 4X9 LF (GAUZE/BANDAGES/DRESSINGS) ×2 IMPLANT
BNDG GAUZE STRTCH 6 (GAUZE/BANDAGES/DRESSINGS) IMPLANT
CLOTH BEACON ORANGE TIMEOUT ST (SAFETY) ×2 IMPLANT
CORDS BIPOLAR (ELECTRODE) ×1 IMPLANT
COTTON STERILE ROLL (GAUZE/BANDAGES/DRESSINGS) IMPLANT
COVER SURGICAL LIGHT HANDLE (MISCELLANEOUS) ×2 IMPLANT
CUFF TOURNIQUET SINGLE 24IN (TOURNIQUET CUFF) IMPLANT
CUFF TOURNIQUET SINGLE 34IN LL (TOURNIQUET CUFF) IMPLANT
CUFF TOURNIQUET SINGLE 44IN (TOURNIQUET CUFF) IMPLANT
DRAPE OEC MINIVIEW 54X84 (DRAPES) IMPLANT
DRAPE U-SHAPE 47X51 STRL (DRAPES) ×2 IMPLANT
DRSG ADAPTIC 3X8 NADH LF (GAUZE/BANDAGES/DRESSINGS) IMPLANT
DURAPREP 26ML APPLICATOR (WOUND CARE) ×2 IMPLANT
ELECT REM PT RETURN 9FT ADLT (ELECTROSURGICAL) ×2
ELECTRODE REM PT RTRN 9FT ADLT (ELECTROSURGICAL) ×1 IMPLANT
GAUZE SPONGE 2X2 8PLY STRL LF (GAUZE/BANDAGES/DRESSINGS) IMPLANT
GLOVE BIOGEL PI IND STRL 7.5 (GLOVE) IMPLANT
GLOVE BIOGEL PI IND STRL 9 (GLOVE) ×1 IMPLANT
GLOVE BIOGEL PI INDICATOR 7.5 (GLOVE) ×1
GLOVE BIOGEL PI INDICATOR 9 (GLOVE) ×1
GLOVE SURG ORTHO 9.0 STRL STRW (GLOVE) ×2 IMPLANT
GLOVE SURG SS PI 6.5 STRL IVOR (GLOVE) ×1 IMPLANT
GLOVE SURG SS PI 7.5 STRL IVOR (GLOVE) ×2 IMPLANT
GOWN PREVENTION PLUS XLARGE (GOWN DISPOSABLE) ×2 IMPLANT
GOWN SRG XL XLNG 56XLVL 4 (GOWN DISPOSABLE) ×1 IMPLANT
GOWN STRL NON-REIN LRG LVL3 (GOWN DISPOSABLE) ×3 IMPLANT
GOWN STRL NON-REIN XL XLG LVL4 (GOWN DISPOSABLE) ×2
KIT BASIN OR (CUSTOM PROCEDURE TRAY) ×2 IMPLANT
KIT ROOM TURNOVER OR (KITS) ×2 IMPLANT
MANIFOLD NEPTUNE II (INSTRUMENTS) ×1 IMPLANT
NDL HYPO 25GX1X1/2 BEV (NEEDLE) IMPLANT
NEEDLE HYPO 25GX1X1/2 BEV (NEEDLE) IMPLANT
NS IRRIG 1000ML POUR BTL (IV SOLUTION) ×2 IMPLANT
PACK ORTHO EXTREMITY (CUSTOM PROCEDURE TRAY) ×2 IMPLANT
PAD ARMBOARD 7.5X6 YLW CONV (MISCELLANEOUS) ×4 IMPLANT
PAD CAST 4YDX4 CTTN HI CHSV (CAST SUPPLIES) IMPLANT
PADDING CAST COTTON 4X4 STRL (CAST SUPPLIES)
SCREW CORTEX 3.5 30MM (Screw) ×1 IMPLANT
SCREW CORTEX 3.5 40MM (Screw) ×1 IMPLANT
SCREW CORTEX 3.5 45MM (Screw) ×1 IMPLANT
SCREW LOCK CORT ST 3.5X30 (Screw) IMPLANT
SCREW LOCK CORT ST 3.5X40 (Screw) IMPLANT
SPONGE GAUZE 2X2 STER 10/PKG (GAUZE/BANDAGES/DRESSINGS)
SPONGE GAUZE 4X4 12PLY (GAUZE/BANDAGES/DRESSINGS) IMPLANT
SUCTION FRAZIER TIP 10 FR DISP (SUCTIONS) IMPLANT
SUT ETHILON 2 0 PSLX (SUTURE) ×4 IMPLANT
SUT VIC AB 2-0 CT1 27 (SUTURE) ×2
SUT VIC AB 2-0 CT1 TAPERPNT 27 (SUTURE) ×1 IMPLANT
SYR CONTROL 10ML LL (SYRINGE) IMPLANT
TOWEL OR 17X24 6PK STRL BLUE (TOWEL DISPOSABLE) ×2 IMPLANT
TOWEL OR 17X26 10 PK STRL BLUE (TOWEL DISPOSABLE) ×2 IMPLANT
TUBE CONNECTING 12X1/4 (SUCTIONS) IMPLANT
WATER STERILE IRR 1000ML POUR (IV SOLUTION) ×1 IMPLANT

## 2011-10-29 NOTE — Anesthesia Postprocedure Evaluation (Signed)
  Anesthesia Post-op Note  Patient: [name]  Procedure(s) Performed: Procedure(s) (LRB) with comments: METATARSAL OSTEOTOMY (Left) - Left 1st Metatarsal Dorsal Closing Wedge   Patient Location: PACU  Anesthesia Type: General  Level of Consciousness: awake, alert , oriented and patient cooperative  Airway and Oxygen Therapy: Patient Spontanous Breathing  Post-op Pain: mild  Post-op Assessment: Post-op Vital signs reviewed, Patient's Cardiovascular Status Stable, Respiratory Function Stable, Patent Airway, No signs of Nausea or vomiting and Pain level controlled  Post-op Vital Signs: stable  Complications: No apparent anesthesia complications

## 2011-10-29 NOTE — Progress Notes (Signed)
Spoke with Diplomatic Services operational officer at Dr. Audrie Lia office requesting orders. Stated she would let [facility] know.

## 2011-10-29 NOTE — H&P (Signed)
[name] is an 46 y.o. male.   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 He has a plantarflexed first ray with a cavovarus foot this causes increased pressure over the first metatarsal head. Patient has had chronic ulceration with a Wagner grade 1 ulcer beneath the first metatarsal head. Due to failure conservative wound therapy patient presents at this time for dorsal closing wedge osteotomy of the first metatarsal.  Past Medical History  Diagnosis Date  . Diabetic foot ulcer   . Pneumonia   . ETOH abuse   . Diabetes mellitus     no meds  . Anxiety   . Arthritis   . Open wound     bottom of foot    Past Surgical History  Procedure Date  . Total hip arthroplasty   . Total knee arthroplasty   . Joint replacement   . Lung lobectomy   . Hip surgery   . Knee surgery   . Lung lobectomy     No family history on file. Social History:  reports that he has been smoking.  He does not have any smokeless tobacco history on file. He reports that he drinks alcohol. He reports that he uses illicit drugs (Marijuana and Cocaine).  Allergies:  Allergies  Allergen Reactions  . Benadryl (Diphenhydramine Hcl) Other (See Comments)    Pt states leg spasm    No prescriptions prior to admission    Results for orders placed during the hospital encounter of 10/28/11 (from the past 48 hour(s))  SURGICAL PCR SCREEN     Status: Normal   Collection Time   10/28/11  1:05 PM      Component Value Range Comment   MRSA, PCR NEGATIVE  NEGATIVE    Staphylococcus aureus NEGATIVE  NEGATIVE   BASIC METABOLIC PANEL     Status: Abnormal   Collection Time   10/28/11  1:05 PM      Component Value Range Comment   Sodium 138  135 - 145 mEq/L    Potassium 3.4 (*) 3.5 - 5.1 mEq/L    Chloride 98  96 - 112 mEq/L    CO2 29  19 - 32 mEq/L    Glucose, Bld 116 (*) 70 - 99 mg/dL    BUN 3 (*) 6 - 23 mg/dL    Creatinine, Ser 5.95  0.50 - 1.35 mg/dL    Calcium 9.6  8.4 - 63.8 mg/dL    GFR calc non Af Amer >90  >90 mL/min    GFR calc Af Amer >90  >90 mL/min   CBC     Status: Abnormal   Collection Time   10/28/11  1:05 PM      Component Value Range Comment   WBC 11.0 (*) 4.0 - 10.5 K/uL    RBC 5.30  4.22 - 5.81 MIL/uL    Hemoglobin 14.7  13.0 - 17.0 g/dL    HCT 75.6  43.3 - 29.5 %    MCV 84.0  78.0 - 100.0 fL    MCH 27.7  26.0 - 34.0 pg    MCHC 33.0  30.0 - 36.0 g/dL    RDW 18.8 (*) 41.6 - 15.5 %    Platelets 97 (*) 150 - 400 K/uL PLATELET COUNT CONFIRMED BY SMEAR   No results found.  Review of Systems  All other systems reviewed and are negative.    There were no vitals taken for this  visit. Physical Exam  On examination patient's left foot he has good dorsiflexion of the ankle he has a cavovarus foot with a plantar flexed first ray with a Wagner grade 1 ulcer beneath the first metatarsal head. Assessment/Plan Assessment: Diabetic insensate neuropathy cavovarus foot plantar flexed first ray with Wagner grade 1 ulcer beneath the first metatarsal head.  Plan: We'll plan for a dorsal proximal closing wedge osteotomy of the first metatarsal left foot. Risk and benefits were discussed including infection neurovascular injury nonhealing of bone nonhealing of the incision need for additional surgery. Patient states he understands and wished to proceed at this time.  Silvestre Mines V 10/29/2011, 7:00 AM

## 2011-10-29 NOTE — Preoperative (Signed)
Beta Blockers   Reason not to administer Beta Blockers:Not Applicable 

## 2011-10-29 NOTE — Progress Notes (Signed)
Report given to angel rn as caregiver 

## 2011-10-29 NOTE — Transfer of Care (Signed)
Immediate Anesthesia Transfer of Care Note  Patient: [name]  Procedure(s) Performed: Procedure(s) (LRB) with comments: METATARSAL OSTEOTOMY (Left) - Left 1st Metatarsal Dorsal Closing Wedge   Patient Location: PACU  Anesthesia Type: General  Level of Consciousness: awake, alert  and oriented  Airway & Oxygen Therapy: Patient Spontanous Breathing  Post-op Assessment: Report given to PACU RN, Post -op Vital signs reviewed and stable and Patient moving all extremities X 4  Post vital signs: Reviewed and stable  Complications: No apparent anesthesia complications

## 2011-10-29 NOTE — Anesthesia Procedure Notes (Signed)
Procedure Name: LMA Insertion Date/Time: 10/29/2011 12:08 PM Performed by: Carmela Rima Pre-anesthesia Checklist: Patient identified, Timeout performed, Emergency Drugs available, Patient being monitored and Suction available Patient Re-evaluated:Patient Re-evaluated prior to inductionOxygen Delivery Method: Circle system utilized Preoxygenation: Pre-oxygenation with 100% oxygen Intubation Type: IV induction Ventilation: Mask ventilation without difficulty LMA: LMA inserted LMA Size: 5.0 Number of attempts: 1 Placement Confirmation: positive ETCO2 Tube secured with: Tape Dental Injury: Teeth and Oropharynx as per pre-operative assessment

## 2011-10-29 NOTE — Anesthesia Preprocedure Evaluation (Addendum)
Anesthesia Evaluation  Patient identified by MRN, date of birth, ID band Patient awake    Reviewed: Allergy & Precautions, H&P , NPO status , Patient's Chart, lab work & pertinent test results  Airway Mallampati: I TM Distance: >3 FB Neck ROM: Full    Dental  (+) Dental Advidsory Given, Teeth Intact and Caps   Pulmonary shortness of breath, COPDCurrent Smoker,  S/P lobectomy + rhonchi   + decreased breath sounds      Cardiovascular Rhythm:Regular Rate:Normal     Neuro/Psych Anxiety    GI/Hepatic (+)     substance abuse  alcohol use, cocaine use and marijuana use,   Endo/Other  diabetes, Type 2  Renal/GU      Musculoskeletal   Abdominal   Peds  Hematology   Anesthesia Other Findings   Reproductive/Obstetrics                         Anesthesia Physical Anesthesia Plan  ASA: III  Anesthesia Plan: General   Post-op Pain Management:    Induction: Intravenous  Airway Management Planned: LMA  Additional Equipment:   Intra-op Plan:   Post-operative Plan: Extubation in OR  Informed Consent: I have reviewed the patients History and Physical, chart, labs and discussed the procedure including the risks, benefits and alternatives for the proposed anesthesia with the patient or authorized representative who has indicated his/her understanding and acceptance.   Dental Advisory Given  Plan Discussed with: CRNA, Surgeon and Anesthesiologist  Anesthesia Plan Comments:        Anesthesia Quick Evaluation

## 2011-10-29 NOTE — Progress Notes (Signed)
Patient's has an open wound wrapped in tape) on bottom of left foot. 2 CHG wipes used around wound and not directly on wound.

## 2011-10-29 NOTE — Progress Notes (Signed)
Orthopedic Tech Progress Note Patient Details:  [name] 1965-07-14 [id number]  Ortho Devices Type of Ortho Device: Postop boot Ortho Device/Splint Location: Left post op shoe  Ortho Device/Splint Interventions: Application   Cammer, Mickie Bail 10/29/2011, 2:46 PM

## 2011-10-29 NOTE — Op Note (Signed)
OPERATIVE REPORT  DATE OF SURGERY: 10/29/2011  PATIENT:  [name],  46 y.o. male  PRE-OPERATIVE DIAGNOSIS:  Wagoner Ulcer 1st Metatarsal Head with plantarflexed first ray cavovarus foot left  POST-OPERATIVE DIAGNOSIS:  wagoner ulcer 1st metatarsal head with plantarflexed first ray cavovarus foot left  PROCEDURE:  Procedure(s): Dorsal closing wedge osteotomy first metatarsal left foot METATARSAL OSTEOTOMY  SURGEON:  Surgeon(s): Nadara Mustard, MD  ANESTHESIA:   general  EBL:  Minimal ML  SPECIMEN:  No Specimen  TOURNIQUET:  * No tourniquets in log *  PROCEDURE DETAILS: Patient is a 46 year old gentleman with diabetic insensate neuropathy Wagner grade 1 ulcer first metatarsal was a prolonged conservative therapy still has a Wagner grade 1 ulcer and presents at this time for dorsal closing wedge osteotomy. Risks and benefits were discussed including infection neurovascular injury persistent pain nonunion malunion need for additional surgery. Patient states he understands and wished to proceed at this time. Description of procedure patient was brought to the operating room and underwent a general anesthetic. After adequate levels and anesthesia were obtained patient's left lower extremity was prepped using DuraPrep and draped into a sterile field. A dorsal incision was made over the great toe dissection was carried down to the base of the first metatarsal. An osteotomy closing wedge was performed 1 cm distal to the base of the first metatarsal. The wound is irrigated with normal saline the osteotomy was cleansed was closed and stabilized with a 45 mm 3.5 cortical screw. C-arm fluoroscopy verified reduction in both AP and lateral planes. The wound is irrigated with normal saline the skin was closed using 2-0 nylon. The callus and ulcer on the plantar aspect of foot were then repaired and debridement of skin and soft tissue. The wounds were covered with Adaptic orthopedic sponges AB dressing  web roll and Coban. Patient was extubated taken to the PACU in stable condition  PLAN OF CARE: Discharge to home after PACU  PATIENT DISPOSITION:  PACU - hemodynamically stable.   Nadara Mustard, MD 10/29/2011 12:50 PM

## 2011-10-30 ENCOUNTER — Encounter (HOSPITAL_COMMUNITY): Payer: Self-pay | Admitting: Orthopedic Surgery

## 2011-12-03 IMAGING — CR DG LUMBAR SPINE COMPLETE 4+V
5 series · 5 of 5 positions shown · non-contrast
Comparison: Lumbar spine films of 01/02/2005

CLINICAL DATA: Low back pain for 2 years, no radiculopathy, no
injury

LUMBAR SPINE - COMPLETE 4+ VIEW

[view not recorded (1 of 5)]
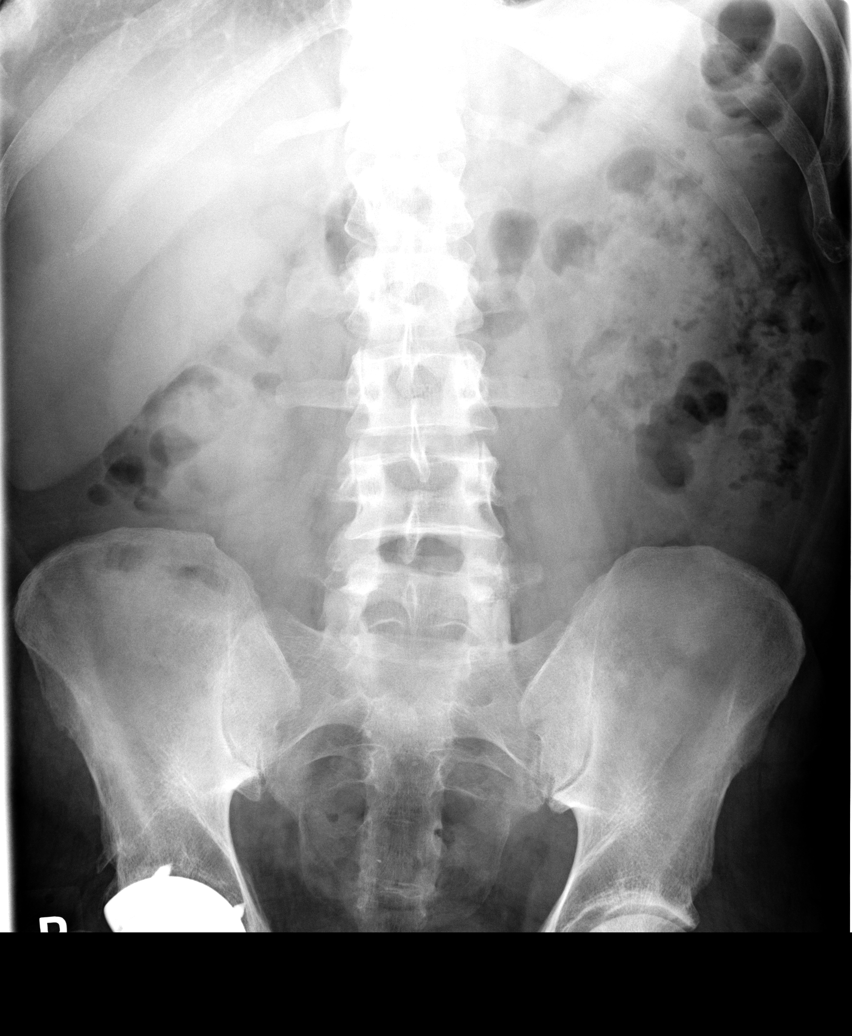

[view not recorded (2 of 5)]
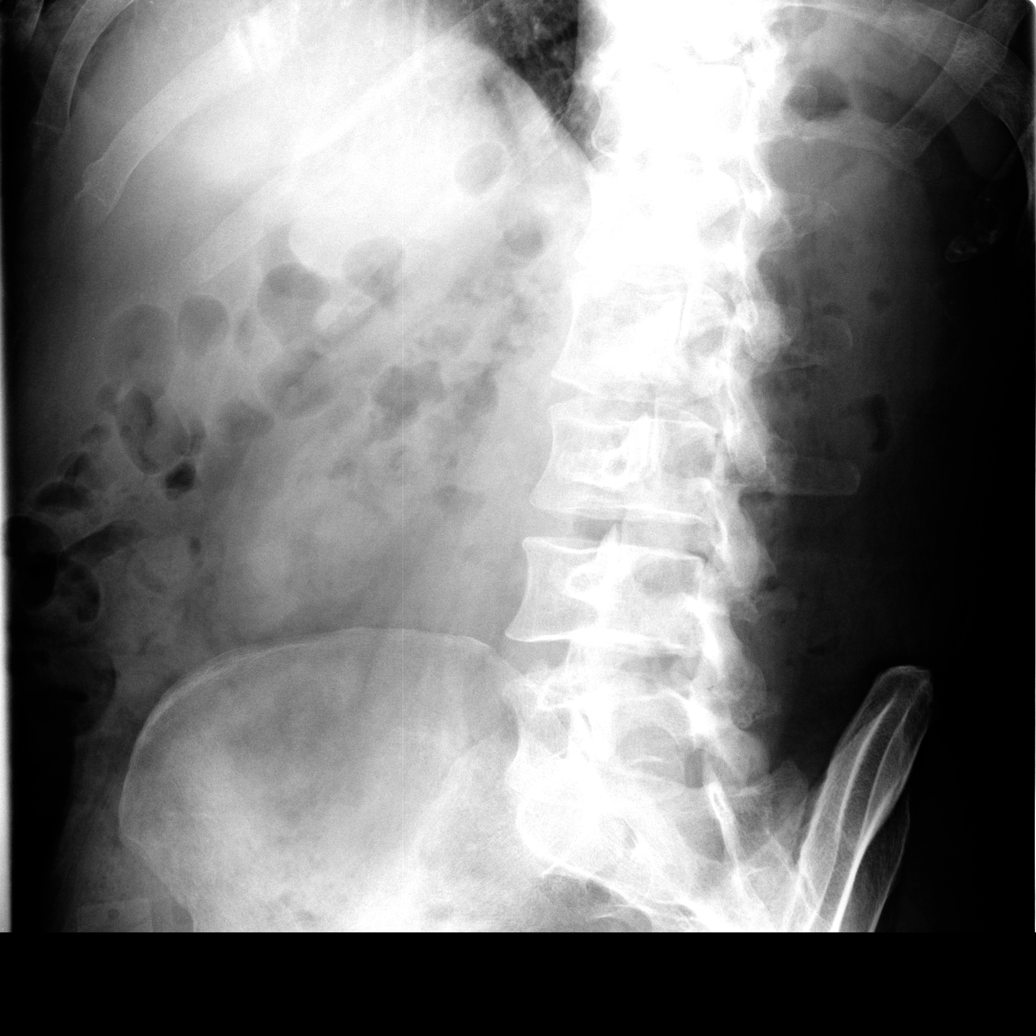

[view not recorded (3 of 5)]
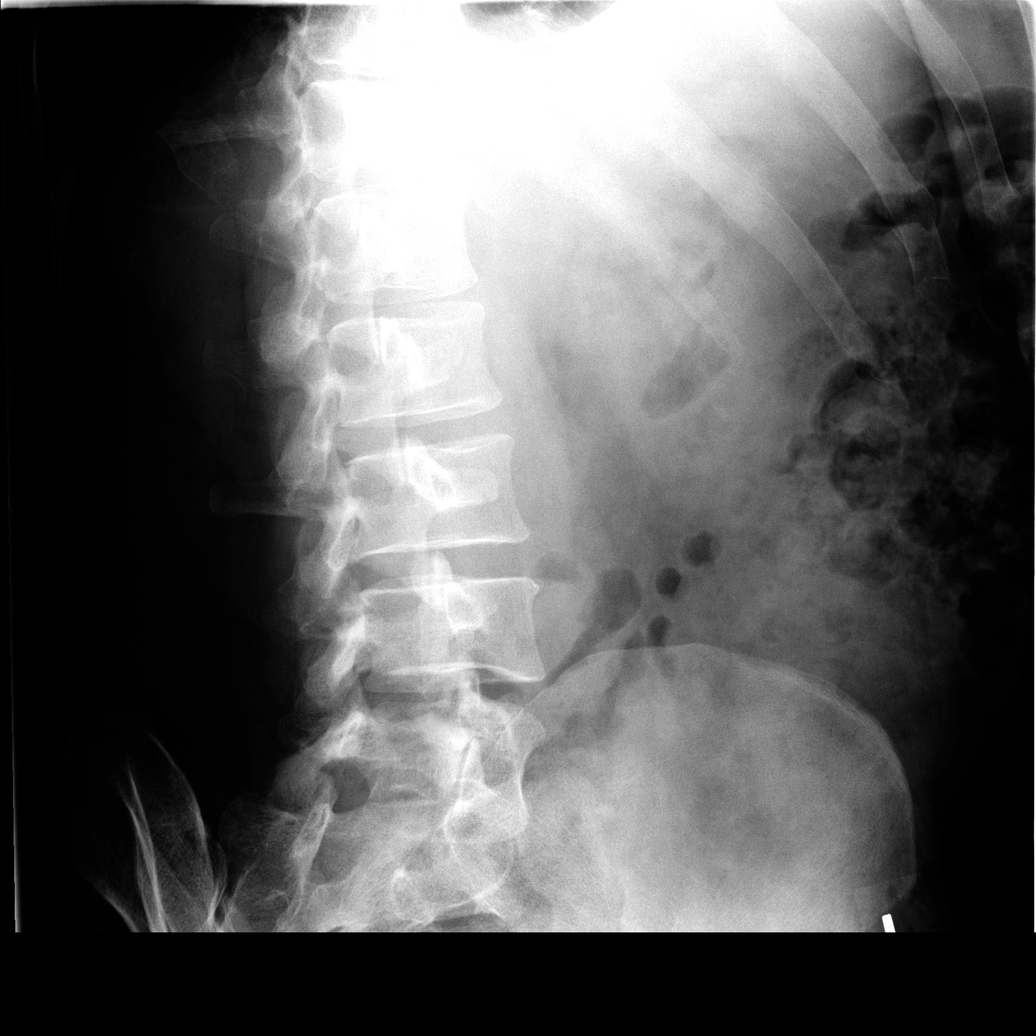

[view not recorded (4 of 5)]
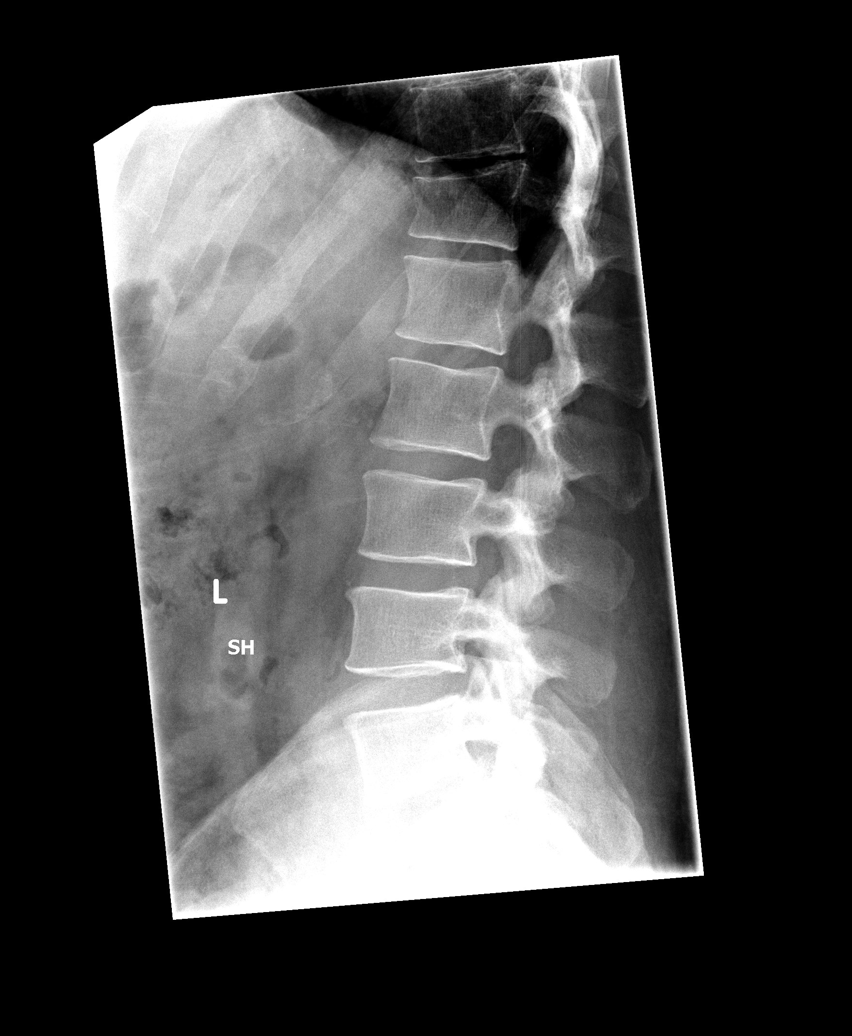

[view not recorded (5 of 5)]
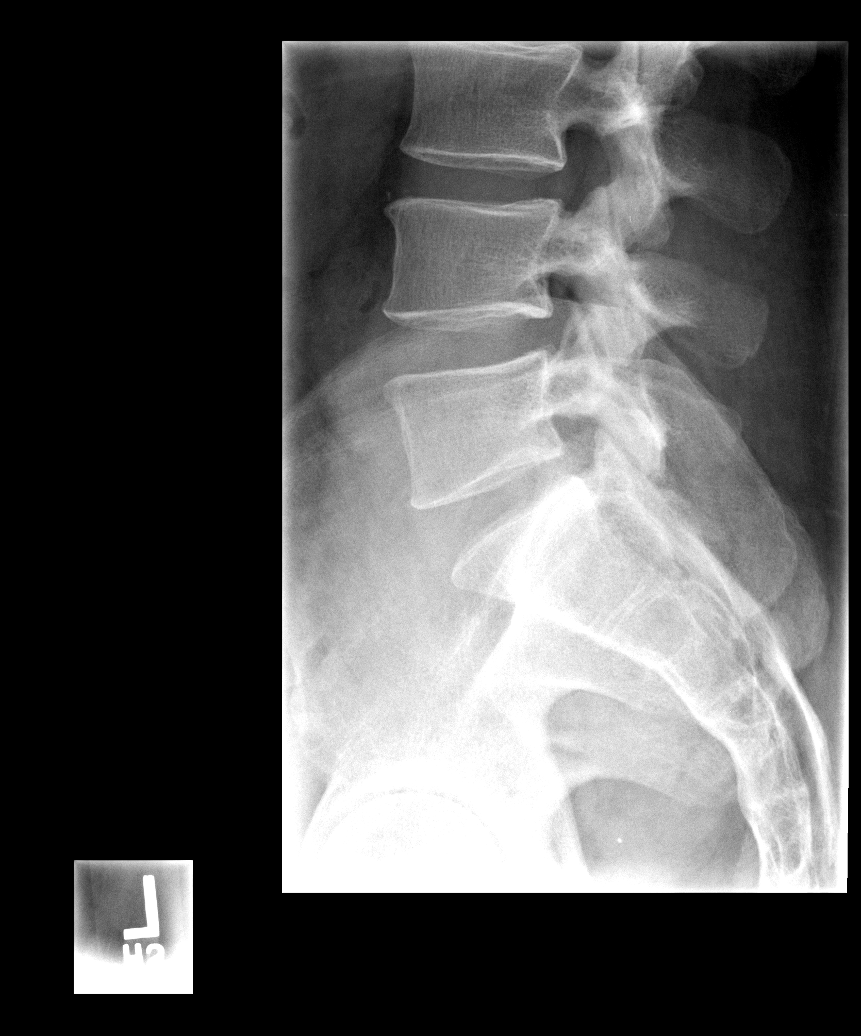

[5 of 5 positions shown; findings below may reference images not displayed]

FINDINGS: The lumbar vertebrae remain in normal alignment with
normal disc spaces.  Mild anterior wedge compression deformity of
T12 appears stable.  No interval compression deformity is seen.
The SI joints appear normally corticated.  Right hip replacement is
noted.
IMPRESSION: Normal alignment with normal disc spaces.  Stable old mild
compression of T12.

## 2012-02-08 ENCOUNTER — Emergency Department (HOSPITAL_COMMUNITY): Payer: Medicare Other

## 2012-02-08 ENCOUNTER — Emergency Department (HOSPITAL_COMMUNITY)
Admission: EM | Admit: 2012-02-08 | Discharge: 2012-02-09 | Disposition: A | Discharge status: Other Acute Inpatient Hospital | Payer: Medicare Other | Source: Home / Self Care | Attending: Emergency Medicine | Admitting: Emergency Medicine

## 2012-02-08 ENCOUNTER — Encounter (HOSPITAL_COMMUNITY): Payer: Self-pay | Admitting: *Deleted

## 2012-02-08 DIAGNOSIS — S8000XA Contusion of unspecified knee, initial encounter: Secondary | ICD-10-CM

## 2012-02-08 DIAGNOSIS — F1010 Alcohol abuse, uncomplicated: Secondary | ICD-10-CM

## 2012-02-08 LAB — RAPID URINE DRUG SCREEN, HOSP PERFORMED
Amphetamines: NOT DETECTED
Barbiturates: NOT DETECTED
Benzodiazepines: NOT DETECTED
Cocaine: NOT DETECTED
Opiates: NOT DETECTED
Tetrahydrocannabinol: NOT DETECTED

## 2012-02-08 LAB — CBC WITH DIFFERENTIAL/PLATELET
Basophils Absolute: 0 10*3/uL (ref 0.0–0.1)
Basophils Relative: 1 % (ref 0–1)
Eosinophils Absolute: 0.1 10*3/uL (ref 0.0–0.7)
Eosinophils Relative: 1 % (ref 0–5)
HCT: 47.1 % (ref 39.0–52.0)
Hemoglobin: 16.6 g/dL (ref 13.0–17.0)
Lymphocytes Relative: 36 % (ref 12–46)
Lymphs Abs: 2.3 10*3/uL (ref 0.7–4.0)
MCH: 29 pg (ref 26.0–34.0)
MCHC: 35.2 g/dL (ref 30.0–36.0)
MCV: 82.3 fL (ref 78.0–100.0)
Monocytes Absolute: 0.7 10*3/uL (ref 0.1–1.0)
Monocytes Relative: 11 % (ref 3–12)
Neutro Abs: 3.5 10*3/uL (ref 1.7–7.7)
Neutrophils Relative %: 52 % (ref 43–77)
Platelets: 112 10*3/uL — ABNORMAL LOW (ref 150–400)
RBC: 5.72 MIL/uL (ref 4.22–5.81)
RDW: 16.1 % — ABNORMAL HIGH (ref 11.5–15.5)
WBC: 6.6 10*3/uL (ref 4.0–10.5)

## 2012-02-08 LAB — HEPATIC FUNCTION PANEL
ALT: 31 U/L (ref 0–53)
AST: 58 U/L — ABNORMAL HIGH (ref 0–37)
Albumin: 4.1 g/dL (ref 3.5–5.2)
Alkaline Phosphatase: 107 U/L (ref 39–117)
Bilirubin, Direct: 0.2 mg/dL (ref 0.0–0.3)
Indirect Bilirubin: 0.8 mg/dL (ref 0.3–0.9)
Total Bilirubin: 1 mg/dL (ref 0.3–1.2)
Total Protein: 7.5 g/dL (ref 6.0–8.3)

## 2012-02-08 LAB — BASIC METABOLIC PANEL
BUN: 5 mg/dL — ABNORMAL LOW (ref 6–23)
CO2: 24 mEq/L (ref 19–32)
Calcium: 9.2 mg/dL (ref 8.4–10.5)
Chloride: 93 mEq/L — ABNORMAL LOW (ref 96–112)
Creatinine, Ser: 0.7 mg/dL (ref 0.50–1.35)
GFR calc Af Amer: >90 mL/min (ref >90)
GFR calc non Af Amer: >90 mL/min (ref >90)
Glucose, Bld: 77 mg/dL (ref 70–99)
Potassium: 3.6 mEq/L (ref 3.5–5.1)
Sodium: 134 mEq/L — ABNORMAL LOW (ref 135–145)

## 2012-02-08 LAB — ETHANOL: Alcohol, Ethyl (B): 240 mg/dL — ABNORMAL HIGH (ref 0–11)

## 2012-02-08 LAB — PROTIME-INR
INR: 0.86 (ref 0.00–1.49)
Prothrombin Time: 11.7 seconds (ref 11.6–15.2)

## 2012-02-08 LAB — LIPASE, BLOOD: Lipase: 54 U/L (ref 11–59)

## 2012-02-08 MED ORDER — NICOTINE 21 MG/24HR TD PT24
21.0000 mg | MEDICATED_PATCH | Freq: Every day | TRANSDERMAL | Status: DC
  Administered 2012-02-08 – 2012-02-09 (×2): 21 mg via TRANSDERMAL
  Filled 2012-02-08 (×2): qty 1

## 2012-02-08 MED ORDER — IBUPROFEN 400 MG PO TABS
600.0000 mg | ORAL_TABLET | Freq: Once | ORAL | Status: AC
  Administered 2012-02-08: 600 mg via ORAL
  Filled 2012-02-08: qty 2

## 2012-02-08 MED ORDER — ALUM & MAG HYDROXIDE-SIMETH 200-200-20 MG/5ML PO SUSP: 30.0000 mL | ORAL | Status: DC | PRN

## 2012-02-08 MED ORDER — ONDANSETRON HCL 4 MG PO TABS
4.0000 mg | ORAL_TABLET | Freq: Three times a day (TID) | ORAL | Status: DC | PRN
  Administered 2012-02-08 – 2012-02-09 (×3): 4 mg via ORAL
  Filled 2012-02-08 (×5): qty 1

## 2012-02-08 MED ORDER — OXYCODONE HCL 5 MG PO TABS
5.0000 mg | ORAL_TABLET | Freq: Once | ORAL | Status: AC
  Administered 2012-02-08: 5 mg via ORAL
  Filled 2012-02-08: qty 1

## 2012-02-08 MED ORDER — LORAZEPAM 1 MG PO TABS
1.0000 mg | ORAL_TABLET | Freq: Three times a day (TID) | ORAL | Status: DC | PRN
  Administered 2012-02-09: 1 mg via ORAL
  Filled 2012-02-08: qty 1

## 2012-02-08 MED ORDER — IBUPROFEN 400 MG PO TABS
600.0000 mg | ORAL_TABLET | Freq: Three times a day (TID) | ORAL | Status: DC | PRN
  Administered 2012-02-09 (×2): 600 mg via ORAL
  Filled 2012-02-08 (×2): qty 2

## 2012-02-08 MED ORDER — LORAZEPAM 1 MG PO TABS
1.0000 mg | ORAL_TABLET | Freq: Once | ORAL | Status: AC
  Administered 2012-02-08: 1 mg via ORAL
  Filled 2012-02-08: qty 1

## 2012-02-08 NOTE — ED Provider Notes (Addendum)
History    47 year old male presenting for alcohol detox. Patient has a long-standing history of alcohol abuse. Last drink was today. Patient drinks beer. Denies any other ingestion. Patient states that he came in today because he "ran out of beer." He states that he does want help and would like to quit though. No suicidal or homicidal ideation. Patient is also complaining of left knee pain. And a mechanical fall when he tripped yesterday and fell directly onto the knee. Persistent pain since then. Can ambulate, although with increased pain. Also complaining of URI type symptoms for the last several days. Nonproductive cough. No fevers or chills. Patient is a smoker. No unusual leg pain or swelling. Denies history of blood clot.  CSN: [id number]  Arrival date & time 02/08/12  1513   First MD Initiated Contact with Patient 02/08/12 1642      Chief Complaint  Patient presents with  . Shortness of Breath    (Consider location/radiation/quality/duration/timing/severity/associated sxs/prior treatment) HPI  Past Medical History  Diagnosis Date  . Diabetic foot ulcer   . Pneumonia   . ETOH abuse   . Diabetes mellitus     no meds  . Anxiety   . Arthritis   . Open wound     bottom of foot    Past Surgical History  Procedure Date  . Total hip arthroplasty   . Total knee arthroplasty   . Joint replacement   . Lung lobectomy   . Hip surgery   . Knee surgery   . Lung lobectomy   . Metatarsal osteotomy 10/29/2011    Procedure: METATARSAL OSTEOTOMY;  Surgeon: Nadara Mustard, MD;  Location: [facility] OR;  Service: Orthopedics;  Laterality: Left;  Left 1st Metatarsal Dorsal Closing Wedge     History reviewed. No pertinent family history.  History  Substance Use Topics  . Smoking status: Current Every Day Smoker -- 1.0 packs/day for 30 years  . Smokeless tobacco: Not on file  . Alcohol Use: 0.0 oz/week    15-20 drink(s) per week     Comment: prefers beer      Review of Systems  All  systems reviewed and negative, other than as noted in HPI.   Allergies  Benadryl  Home Medications   Current Outpatient Rx  Name  Route  Sig  Dispense  Refill  . LORAZEPAM 2 MG PO TABS   Oral   Take 2 mg by mouth 2 (two) times daily.          . OXYCODONE-ACETAMINOPHEN 7.5-325 MG PO TABS   Oral   Take 1 tablet by mouth every 4 (four) hours as needed for pain.   60 tablet   0   . IBUPROFEN 200 MG PO TABS   Oral   Take 200 mg by mouth every 6 (six) hours as needed. For pain           BP 135/79  Pulse 81  Temp 97.8 F (36.6 C) (Oral)  Resp 18  Ht 6\' 3"  (1.905 m)  Wt 170 lb (77.111 kg)  BMI 21.25 kg/m2  SpO2 100%  Physical Exam  Nursing note and vitals reviewed. Constitutional: He appears well-developed and well-nourished. No distress.  HENT:  Head: Normocephalic and atraumatic.  Eyes: Conjunctivae normal are normal. Right eye exhibits no discharge. Left eye exhibits no discharge.  Neck: Neck supple.  Cardiovascular: Normal rate, regular rhythm and normal heart sounds.  Exam reveals no gallop and no friction rub.   No murmur heard.  Pulmonary/Chest: Effort normal and breath sounds normal. No respiratory distress.  Abdominal: Soft. He exhibits no distension. There is no tenderness.  Musculoskeletal: He exhibits no edema and no tenderness.       Left knee with a well-healed anterior surgical scar. Right knee grossly normal in appearance. No effusion appreciated. No concerning skin changes. Moderate tenderness along the anteromedial aspect of the knee. Increased pain with range of motion. No ligamentous laxity appreciated. Neurovascularly intact distally. No calf tenderness. Negative Homan's. No palpable cords.   Neurological: He is alert. No cranial nerve deficit. He exhibits normal muscle tone.  Skin: Skin is warm and dry.  Psychiatric: He has a normal mood and affect. His behavior is normal. Thought content normal.       Speech is clear. Content is appropriate. Does  not appear to be responding to internal stimuli    ED Course  Procedures (including critical care time)  Labs Reviewed  CBC WITH DIFFERENTIAL - Abnormal; Notable for the following:    RDW 16.1 (*)     Platelets 112 (*)     All other components within normal limits  ETHANOL - Abnormal; Notable for the following:    Alcohol, Ethyl (B) 240 (*)     All other components within normal limits  HEPATIC FUNCTION PANEL - Abnormal; Notable for the following:    AST 58 (*)     All other components within normal limits  BASIC METABOLIC PANEL - Abnormal; Notable for the following:    Sodium 134 (*)     Chloride 93 (*)     BUN 5 (*)     All other components within normal limits  URINE RAPID DRUG SCREEN (HOSP PERFORMED)  PROTIME-INR  LIPASE, BLOOD   Dg Chest 2 View  02/08/2012  *RADIOLOGY REPORT*  Clinical Data: Status post fall.  Shortness of breath.  CHEST - 2 VIEW  Comparison: CT chest 10/30/2008.  Findings: There is chronic blunting of the left costophrenic angle compatible with scar as seen on CT scan.  Lungs appear clear. Heart size upper normal.  No pneumothorax or pleural fluid.  IMPRESSION: No acute disease.   Original Report Authenticated By: Holley Dexter, M.D.    Dg Knee Complete 4 Views Right  02/08/2012  *RADIOLOGY REPORT*  Clinical Data: Fall, pain.  RIGHT KNEE - COMPLETE 4+ VIEW  Comparison: Plain films 05/30/2011.  Findings: There is no acute bony or joint abnormality.  Advance for age degenerative disease is again seen and appears worst in the lateral compartment where it is severe.  No joint effusion.  IMPRESSION: No acute finding.  Advanced for age degenerative change, worst in the lateral compartment.   Original Report Authenticated By: Holley Dexter, M.D.      1. Alcohol abuse   2. Knee contusion       MDM  47 year old male with alcohol abuse. Requesting detox. Patient is voluntary. There is no suicidal homicidal ideation. No psychosis. Patient has been medically  cleared at this time. Likely a viral URI and contusion of the right knee. Imaging negative for acute pathology. There is no ligamentous laxity appreciated on my exam, but patient states this feels like there is. Patient is intoxicated though so who knows how much of this is actually real versus poor coordination secondary to ethanol intoxication. Will place patient in knee immobilizer though. He can followup as an outpatient with orthopedics. Discussed with the ACT team for possible placement.        Raeford Razor, MD  02/08/12 2158 

## 2012-02-08 NOTE — ED Notes (Signed)
Patient told patient advocate that he was nauseous and also trembling more when she entered room to take empty meal tray.

## 2012-02-08 NOTE — ED Notes (Addendum)
Sob, cough, clear sputum.  etoh every day for 1 month.  "I Ran out of beer today"  Wants help to "quit alcohol"

## 2012-02-08 NOTE — ED Notes (Signed)
Patient called on call bell, asking for something for nausea and something to drink.

## 2012-02-08 NOTE — ED Notes (Signed)
Went into patient's room to assess patient and administer zofran for nausea. Patient asleep at this time. Did not hear me enter room.

## 2012-02-09 ENCOUNTER — Inpatient Hospital Stay (HOSPITAL_COMMUNITY)
Admission: AD | Admit: 2012-02-09 | Discharge: 2012-02-12 | DRG: 897 | Disposition: A | Discharge status: Home / Self Care | Payer: Medicare Other | Source: Ambulatory Visit | Attending: Psychiatry | Admitting: Psychiatry

## 2012-02-09 ENCOUNTER — Encounter (HOSPITAL_COMMUNITY): Payer: Self-pay | Admitting: *Deleted

## 2012-02-09 DIAGNOSIS — F1020 Alcohol dependence, uncomplicated: Secondary | ICD-10-CM

## 2012-02-09 DIAGNOSIS — R7301 Impaired fasting glucose: Secondary | ICD-10-CM

## 2012-02-09 DIAGNOSIS — Z79899 Other long term (current) drug therapy: Secondary | ICD-10-CM

## 2012-02-09 DIAGNOSIS — F10988 Alcohol use, unspecified with other alcohol-induced disorder: Secondary | ICD-10-CM | POA: Diagnosis present

## 2012-02-09 DIAGNOSIS — F1010 Alcohol abuse, uncomplicated: Principal | ICD-10-CM | POA: Diagnosis present

## 2012-02-09 DIAGNOSIS — L97519 Non-pressure chronic ulcer of other part of right foot with unspecified severity: Secondary | ICD-10-CM

## 2012-02-09 DIAGNOSIS — L97509 Non-pressure chronic ulcer of other part of unspecified foot with unspecified severity: Secondary | ICD-10-CM

## 2012-02-09 DIAGNOSIS — F1011 Alcohol abuse, in remission: Secondary | ICD-10-CM

## 2012-02-09 DIAGNOSIS — F10229 Alcohol dependence with intoxication, unspecified: Secondary | ICD-10-CM | POA: Diagnosis present

## 2012-02-09 DIAGNOSIS — F1910 Other psychoactive substance abuse, uncomplicated: Secondary | ICD-10-CM

## 2012-02-09 MED ORDER — NICOTINE 21 MG/24HR TD PT24
21.0000 mg | MEDICATED_PATCH | Freq: Every day | TRANSDERMAL | Status: DC
  Administered 2012-02-10 – 2012-02-12 (×3): 21 mg via TRANSDERMAL
  Filled 2012-02-09 (×6): qty 1

## 2012-02-09 MED ORDER — CHLORDIAZEPOXIDE HCL 25 MG PO CAPS
25.0000 mg | ORAL_CAPSULE | Freq: Four times a day (QID) | ORAL | Status: DC
  Administered 2012-02-10: 25 mg via ORAL
  Filled 2012-02-09: qty 1

## 2012-02-09 MED ORDER — CLONIDINE HCL 0.1 MG/24HR TD PTWK
0.1000 mg | MEDICATED_PATCH | TRANSDERMAL | Status: DC
  Administered 2012-02-09: 0.1 mg via TRANSDERMAL
  Filled 2012-02-09 (×2): qty 1

## 2012-02-09 MED ORDER — MAGNESIUM HYDROXIDE 400 MG/5ML PO SUSP: 30.0000 mL | Freq: Every day | ORAL | Status: DC | PRN

## 2012-02-09 MED ORDER — THIAMINE HCL 100 MG/ML IJ SOLN
100.0000 mg | Freq: Once | INTRAMUSCULAR | Status: AC
  Administered 2012-02-09: 100 mg via INTRAMUSCULAR

## 2012-02-09 MED ORDER — IBUPROFEN 600 MG PO TABS
600.0000 mg | ORAL_TABLET | Freq: Four times a day (QID) | ORAL | Status: DC | PRN
  Administered 2012-02-09 – 2012-02-10 (×2): 600 mg via ORAL
  Filled 2012-02-09 (×2): qty 1

## 2012-02-09 MED ORDER — LORAZEPAM 1 MG PO TABS
2.0000 mg | ORAL_TABLET | Freq: Once | ORAL | Status: AC
  Administered 2012-02-09: 2 mg via ORAL
  Filled 2012-02-09: qty 2

## 2012-02-09 MED ORDER — CHLORDIAZEPOXIDE HCL 25 MG PO CAPS: 25.0000 mg | ORAL_CAPSULE | Freq: Four times a day (QID) | ORAL | Status: DC | PRN

## 2012-02-09 MED ORDER — TRAZODONE HCL 50 MG PO TABS
50.0000 mg | ORAL_TABLET | Freq: Every evening | ORAL | Status: DC | PRN
  Administered 2012-02-09 – 2012-02-11 (×4): 50 mg via ORAL
  Filled 2012-02-09 (×3): qty 1
  Filled 2012-02-09: qty 14
  Filled 2012-02-09 (×4): qty 1
  Filled 2012-02-09: qty 14
  Filled 2012-02-09 (×2): qty 1

## 2012-02-09 MED ORDER — CHLORDIAZEPOXIDE HCL 25 MG PO CAPS: 25.0000 mg | ORAL_CAPSULE | Freq: Three times a day (TID) | ORAL | Status: DC

## 2012-02-09 MED ORDER — ALUM & MAG HYDROXIDE-SIMETH 200-200-20 MG/5ML PO SUSP: 30.0000 mL | ORAL | Status: DC | PRN

## 2012-02-09 MED ORDER — LORAZEPAM 1 MG PO TABS: 2.0000 mg | ORAL_TABLET | Freq: Two times a day (BID) | ORAL | Status: DC

## 2012-02-09 MED ORDER — PNEUMOCOCCAL VAC POLYVALENT 25 MCG/0.5ML IJ INJ: 0.5000 mL | INJECTION | INTRAMUSCULAR | Status: AC

## 2012-02-09 MED ORDER — ONDANSETRON 4 MG PO TBDP: 4.0000 mg | ORAL_TABLET | Freq: Four times a day (QID) | ORAL | Status: DC | PRN

## 2012-02-09 MED ORDER — LORAZEPAM 1 MG PO TABS
2.0000 mg | ORAL_TABLET | Freq: Two times a day (BID) | ORAL | Status: DC
  Administered 2012-02-09: 2 mg via ORAL
  Filled 2012-02-09 (×2): qty 2

## 2012-02-09 MED ORDER — ADULT MULTIVITAMIN W/MINERALS CH
1.0000 | ORAL_TABLET | Freq: Every day | ORAL | Status: DC
  Administered 2012-02-10 – 2012-02-12 (×3): 1 via ORAL
  Filled 2012-02-09 (×5): qty 1

## 2012-02-09 MED ORDER — OXYCODONE-ACETAMINOPHEN 5-325 MG PO TABS
2.0000 | ORAL_TABLET | Freq: Once | ORAL | Status: AC
  Administered 2012-02-09: 2 via ORAL
  Filled 2012-02-09: qty 2

## 2012-02-09 MED ORDER — OXYCODONE-ACETAMINOPHEN 5-325 MG PO TABS
1.0000 | ORAL_TABLET | Freq: Once | ORAL | Status: AC
  Administered 2012-02-09: 1 via ORAL
  Filled 2012-02-09: qty 1

## 2012-02-09 MED ORDER — LOPERAMIDE HCL 2 MG PO CAPS: 2.0000 mg | ORAL_CAPSULE | ORAL | Status: DC | PRN

## 2012-02-09 MED ORDER — HYDROXYZINE HCL 25 MG PO TABS: 25.0000 mg | ORAL_TABLET | Freq: Four times a day (QID) | ORAL | Status: DC | PRN

## 2012-02-09 MED ORDER — CHLORDIAZEPOXIDE HCL 25 MG PO CAPS: 25.0000 mg | ORAL_CAPSULE | Freq: Every day | ORAL | Status: DC

## 2012-02-09 MED ORDER — ACETAMINOPHEN 325 MG PO TABS
650.0000 mg | ORAL_TABLET | Freq: Four times a day (QID) | ORAL | Status: DC | PRN
  Administered 2012-02-10 – 2012-02-12 (×2): 650 mg via ORAL

## 2012-02-09 MED ORDER — CHLORDIAZEPOXIDE HCL 25 MG PO CAPS
25.0000 mg | ORAL_CAPSULE | Freq: Once | ORAL | Status: AC
  Administered 2012-02-09: 25 mg via ORAL
  Filled 2012-02-09: qty 1

## 2012-02-09 MED ORDER — VITAMIN B-1 100 MG PO TABS
100.0000 mg | ORAL_TABLET | Freq: Every day | ORAL | Status: DC
  Administered 2012-02-10 – 2012-02-12 (×3): 100 mg via ORAL
  Filled 2012-02-09 (×5): qty 1

## 2012-02-09 MED ORDER — CHLORDIAZEPOXIDE HCL 25 MG PO CAPS: 25.0000 mg | ORAL_CAPSULE | ORAL | Status: DC

## 2012-02-09 NOTE — Progress Notes (Signed)
Adult Psychoeducational Group Note  Date:  02/09/2012 Time:  2000  Group Topic/Focus:  AA  Participation Level:  Active  Participation Quality:  Appropriate and Attentive  Affect:  Appropriate  Cognitive:  Alert, Appropriate and Oriented  Insight: Good  Engagement in Group:  Engaged  Modes of Intervention:  Support  Additional Comments:  Pt is a new admit but immediately attended group after being admitted.   Humberto Seals Monique 02/09/2012, 9:56 PM

## 2012-02-09 NOTE — ED Notes (Signed)
Security in room to lock patient's valuables in safe and wand patient. Patient's other belongings locked in EMS locker.

## 2012-02-09 NOTE — Progress Notes (Signed)
Patient ID: [name], male   DOB: [birth year], 47 y.o.   MRN: [id number] Pt admitted voluntarily to [facility] for detox from ETOH. Pt with previous admissions to [facility].  Pt reports drinking 24-30 beer daily.  Pt states he had been sober for approximately seven months and relapsed around Christmas.  Patients reports stressor at that time relating to arguments with girlfriend and financial concerns. Pt admitted wearing an immobilizer on his R knee which he states is related to arthritis.  Pt reports that he was using a cane at home but his cane is now broken.  Pt denies pending legal charges.  Pt also denies SI, HI and AVH.  Fifteen minute checks in progress. Pt safe on unit.

## 2012-02-09 NOTE — BH Assessment (Addendum)
Assessment Note   [name] is an 47 y.o. male. The patient came to the ED 02/08/2012 seeking help with detox from alcohol. He had also fallen and injured his right knee. His knee was assessed and is now in an immobilizer. According to notes he is to follow up as an outpatient. He is ambulatory and able to attend his ADL's. He has a history of alcohol use since age 72. He did maintain sobriety for 3-4 years at one time. He was in treatment at [facility] 06/2011. He was detoxed and then transferred to [facility] at [facility]. Today he reports that he has been drinking 15-20: 12 ounce beers daily for 1-2 months.  He denies any history of seizures. He denies any history of DT's. He does complain of light disturbance, tremors, hot and cold flashes, and increased agitation. He denies any history of suicidal or homicidal ideation. He is not psychotic.  Discussed with Dr Clelia Schaumann, patient referred to [facility], [facility] and [facility].  Axis I: Alcohol Abuse Axis II: Deferred Axis III:  Past Medical History  Diagnosis Date  . Diabetic foot ulcer   . Pneumonia   . ETOH abuse   . Diabetes mellitus     no meds  . Anxiety   . Arthritis   . Open wound     bottom of foot   Axis IV: other psychosocial or environmental problems, problems related to social environment and problems with access to health care services Axis V: 41-50 serious symptoms  Past Medical History:  Past Medical History  Diagnosis Date  . Diabetic foot ulcer   . Pneumonia   . ETOH abuse   . Diabetes mellitus     no meds  . Anxiety   . Arthritis   . Open wound     bottom of foot    Past Surgical History  Procedure Date  . Total hip arthroplasty   . Total knee arthroplasty   . Joint replacement   . Lung lobectomy   . Hip surgery   . Knee surgery   . Lung lobectomy   . Metatarsal osteotomy 10/29/2011    Procedure: METATARSAL OSTEOTOMY;  Surgeon: Nadara Mustard, MD;  Location: [facility] OR;  Service: Orthopedics;  Laterality: Left;  Left 1st  Metatarsal Dorsal Closing Wedge     Family History: History reviewed. No pertinent family history.  Social History:  reports that he has been smoking.  He does not have any smokeless tobacco history on file. He reports that he drinks alcohol. He reports that he uses illicit drugs (Marijuana and Cocaine).  Additional Social History:  Alcohol / Drug Use Pain Medications: denies Prescriptions: denies Over the Counter: denies History of alcohol / drug use?: Yes Substance #1 Name of Substance 1: etoh-beer 1 - Age of First Use: 12 1 - Amount (size/oz): 15-20    12 ounce beers 1 - Frequency: daily 1 - Duration: 1-2 months 1 - Last Use / Amount: 02/08/2012  CIWA: CIWA-Ar BP: 129/77 mmHg Pulse Rate: 93  Nausea and Vomiting: no nausea and no vomiting Tactile Disturbances: very mild itching, pins and needles, burning or numbness Tremor: two Auditory Disturbances: very mild harshness or ability to frighten Paroxysmal Sweats: three Visual Disturbances: mild sensitivity Anxiety: two Headache, Fullness in Head: very mild Agitation: two Orientation and Clouding of Sensorium: oriented and can do serial additions CIWA-Ar Total: 14  COWS:    Allergies:  Allergies  Allergen Reactions  . Benadryl (Diphenhydramine Hcl) Other (See  Comments)    Pt states leg spasm    Home Medications:  (Not in a hospital admission)  OB/GYN Status:  No LMP for male patient.  General Assessment Data Location of Assessment: [facility] ACT Assessment: Yes Living Arrangements: Spouse/significant other Can pt return to current living arrangement?: Yes Admission Status: Voluntary Is patient capable of signing voluntary admission?: Yes Transfer from: Acute Hospital Referral Source: MD  Education Status Is patient currently in school?: No  Risk to self Suicidal Ideation: No Suicidal Intent: No Is patient at risk for suicide?: No Suicidal Plan?: No Access to Means: No What has been your use of  drugs/alcohol within the last 12 months?: daily use of alcohol Previous Attempts/Gestures: No How many times?: 0  Other Self Harm Risks: continues to drink-results in falls and physical injury Triggers for Past Attempts: None known Intentional Self Injurious Behavior: None Family Suicide History: No Recent stressful life event(s): Recent negative physical changes;Turmoil (Comment) Persecutory voices/beliefs?: No Depression: Yes Depression Symptoms: Insomnia;Isolating;Loss of interest in usual pleasures Substance abuse history and/or treatment for substance abuse?: Yes Suicide prevention information given to non-admitted patients: Not applicable  Risk to Others Homicidal Ideation: No Thoughts of Harm to Others: No Current Homicidal Intent: No Current Homicidal Plan: No Access to Homicidal Means: No History of harm to others?: No Assessment of Violence: None Noted Does patient have access to weapons?: No Criminal Charges Pending?: No Does patient have a court date: No  Psychosis Hallucinations: None noted Delusions: None noted  Mental Status Report Appear/Hygiene: Improved Eye Contact: Good Motor Activity: Freedom of movement;Psychomotor retardation;Tremors Speech: Slow;Logical/coherent Level of Consciousness: Alert;Restless Mood: Depressed;Irritable Affect: Anxious;Depressed;Irritable Anxiety Level: Moderate Thought Processes: Coherent;Relevant Judgement: Unimpaired Orientation: Person;Place;Time;Situation Obsessive Compulsive Thoughts/Behaviors: Minimal  Cognitive Functioning Concentration: Normal Memory: Recent Intact;Remote Intact IQ: Average Insight: Fair Impulse Control: Poor Appetite: Good Weight Loss: 0  Weight Gain: 0  Sleep: Decreased Total Hours of Sleep: 2  Vegetative Symptoms: None  ADLScreening [facility]) Patient's cognitive ability adequate to safely complete daily activities?: Yes Patient able to express need for assistance with  ADLs?: Yes Independently performs ADLs?: Yes (appropriate for developmental age)  Abuse/Neglect [facility]) Physical Abuse: Denies Verbal Abuse: Denies Sexual Abuse: Denies  Prior Inpatient Therapy Prior Inpatient Therapy: Yes Prior Therapy Dates: 06/2011 Prior Therapy Facilty/Provider(s): [facility]-transferred to [facility] after dc Reason for Treatment: detox  Prior Outpatient Therapy Prior Outpatient Therapy: No  ADL Screening (condition at time of admission) Patient's cognitive ability adequate to safely complete daily activities?: Yes Patient able to express need for assistance with ADLs?: Yes Independently performs ADLs?: Yes (appropriate for developmental age)       Abuse/Neglect Assessment (Assessment to be complete while patient is alone) Physical Abuse: Denies Verbal Abuse: Denies Sexual Abuse: Denies Values / Beliefs Cultural Requests During Hospitalization: None Spiritual Requests During Hospitalization: None     Nutrition Screen- [facility] Adult/[facility] Patient's home diet: Regular  Additional Information 1:1 In Past 12 Months?: No CIRT Risk: No Elopement Risk: No Does patient have medical clearance?: Yes     Disposition: Patient has been accepted for detox to the service of Dr Geoffery Lyons. He is assigned to room 301-02. He will be a voluntary admission and transported by [facility]. Dr Manus Gunning is in agreement with the disposition. Disposition Disposition of Patient: Inpatient treatment program;Referred to Patient referred to: Other (Comment);[facility] ([facility])  On Site Evaluation by:   Reviewed with Physician:     Jake Shark [facility] 02/09/2012 11:06 AM

## 2012-02-09 NOTE — ED Notes (Signed)
Patient requesting percocet for knee pain, stating that the ibuprofen doesn't help his pain. EDP made aware, verbal order given.

## 2012-02-09 NOTE — ED Notes (Signed)
Pt accepted at [facility] attempting to call report

## 2012-02-09 NOTE — Tx Team (Signed)
Initial Interdisciplinary Treatment Plan  PATIENT STRENGTHS: (choose at least two) Ability for insight Average or above average intelligence Capable of independent living Communication skills General fund of knowledge Motivation for treatment/growth Supportive family/friends  PATIENT STRESSORS: Financial difficulties Substance abuse   PROBLEM LIST: Problem List/Patient Goals Date to be addressed Date deferred Reason deferred Estimated date of resolution  Substance abuse 02/09/12     anxiety 02/09/12     Sleep disturbance 02/09/12                                          DISCHARGE CRITERIA:  Ability to meet basic life and health needs Improved stabilization in mood, thinking, and/or behavior Motivation to continue treatment in a less acute level of care Verbal commitment to aftercare and medication compliance Withdrawal symptoms are absent or subacute and managed without 24-hour nursing intervention  PRELIMINARY DISCHARGE PLAN: Attend aftercare/continuing care group Outpatient therapy Participate in family therapy Return to previous living arrangement  PATIENT/FAMIILY INVOLVEMENT: This treatment plan has been presented to and reviewed with the patient, [name].  The patient and family have been given the opportunity to ask questions and make suggestions.  Hoover Browns 02/09/2012, 8:06 PM

## 2012-02-09 NOTE — [facility] Counselor (Signed)
Patient accepted to [facility] to the service of Dr Dub Mikes for detox.  He is assigned to room 301-02. He will be transported by [facility]. Support paper work signed and distributed. Dr Manus Gunning advised of pending transfer.

## 2012-02-10 ENCOUNTER — Encounter (HOSPITAL_COMMUNITY): Payer: Self-pay | Admitting: Psychiatry

## 2012-02-10 DIAGNOSIS — F1010 Alcohol abuse, uncomplicated: Secondary | ICD-10-CM | POA: Diagnosis present

## 2012-02-10 DIAGNOSIS — F10229 Alcohol dependence with intoxication, unspecified: Secondary | ICD-10-CM | POA: Diagnosis present

## 2012-02-10 DIAGNOSIS — F1020 Alcohol dependence, uncomplicated: Secondary | ICD-10-CM

## 2012-02-10 MED ORDER — HYDROXYZINE HCL 25 MG PO TABS
25.0000 mg | ORAL_TABLET | Freq: Four times a day (QID) | ORAL | Status: DC | PRN
  Filled 2012-02-10: qty 20

## 2012-02-10 MED ORDER — LORAZEPAM 0.5 MG PO TABS: 0.5000 mg | ORAL_TABLET | ORAL | Status: DC | PRN

## 2012-02-10 MED ORDER — LORAZEPAM 1 MG PO TABS
1.0000 mg | ORAL_TABLET | Freq: Three times a day (TID) | ORAL | Status: AC
  Administered 2012-02-11 (×3): 1 mg via ORAL
  Filled 2012-02-10 (×2): qty 1

## 2012-02-10 MED ORDER — IBUPROFEN 800 MG PO TABS
800.0000 mg | ORAL_TABLET | Freq: Three times a day (TID) | ORAL | Status: DC | PRN
  Administered 2012-02-10 – 2012-02-11 (×4): 800 mg via ORAL
  Filled 2012-02-10 (×5): qty 1

## 2012-02-10 MED ORDER — LORAZEPAM 1 MG PO TABS: 1.0000 mg | ORAL_TABLET | Freq: Every day | ORAL | Status: DC

## 2012-02-10 MED ORDER — LORAZEPAM 1 MG PO TABS
1.0000 mg | ORAL_TABLET | Freq: Four times a day (QID) | ORAL | Status: AC
  Administered 2012-02-10 (×3): 1 mg via ORAL
  Filled 2012-02-10 (×3): qty 1

## 2012-02-10 MED ORDER — LORAZEPAM 1 MG PO TABS
1.0000 mg | ORAL_TABLET | Freq: Four times a day (QID) | ORAL | Status: DC | PRN
  Administered 2012-02-11 – 2012-02-12 (×2): 1 mg via ORAL
  Filled 2012-02-10 (×2): qty 1

## 2012-02-10 MED ORDER — LORAZEPAM 1 MG PO TABS
1.0000 mg | ORAL_TABLET | Freq: Two times a day (BID) | ORAL | Status: AC
  Administered 2012-02-11 – 2012-02-12 (×2): 1 mg via ORAL
  Filled 2012-02-10 (×2): qty 1

## 2012-02-10 MED ORDER — ONDANSETRON 4 MG PO TBDP: 4.0000 mg | ORAL_TABLET | Freq: Four times a day (QID) | ORAL | Status: DC | PRN

## 2012-02-10 MED ORDER — LOPERAMIDE HCL 2 MG PO CAPS: 2.0000 mg | ORAL_CAPSULE | ORAL | Status: DC | PRN

## 2012-02-10 MED ORDER — MIRTAZAPINE 30 MG PO TABS
30.0000 mg | ORAL_TABLET | Freq: Every day | ORAL | Status: DC
  Administered 2012-02-10 – 2012-02-11 (×2): 30 mg via ORAL
  Filled 2012-02-10 (×2): qty 1
  Filled 2012-02-10: qty 7
  Filled 2012-02-10: qty 1

## 2012-02-10 NOTE — [facility] Suicide Risk Assessment (Signed)
Suicide Risk Assessment  Admission Assessment     Nursing information obtained from:  Patient Demographic factors:  Male;Caucasian Current Mental Status:  NA Loss Factors:  Financial problems / change in socioeconomic status Historical Factors:  NA Risk Reduction Factors:  Religious beliefs about death  CLINICAL FACTORS:   Alcohol/Substance Abuse/Dependencies  COGNITIVE FEATURES THAT CONTRIBUTE TO RISK: None identified    SUICIDE RISK:   Minimal: No identifiable suicidal ideation.  Patients presenting with no risk factors but with morbid ruminations; may be classified as minimal risk based on the severity of the depressive symptoms  PLAN OF CARE: Supportive approach/coping skills/relapse prevention                               Complete detox  I certify that inpatient services furnished can reasonably be expected to improve the patient's condition.  Tarea Skillman A 02/10/2012, 1:21 PM

## 2012-02-10 NOTE — Progress Notes (Signed)
Pt reports he has had a good day.  He is still feeling some anxiety, but his withdrawal symptoms are minimal.  He denies SI/HI/AV.  He reports the pain in his R knee is relieved by the ordered Ibuprofen, alternated with Tylenol.  Pt makes his needs known to staff.  He reports he wants to be discharged by Friday to follow up with [facility] in [place].  He wants this information passed from shift to shift.  He has been pleasant/cooperative.  Support/encouragement given.  Safety maintained with q15 minute checks.

## 2012-02-10 NOTE — Progress Notes (Signed)
Pt has been up and active in the milieu today.  He reported his sleep as poor and he admitted to feeling anxious a 9 out 10 on his self-inventory.  Pt was on both the librium protocol and ativan 2 mg bid a high warning list came up in chart.  Spoke with th pharmacist about the warning.  She said pt should not be on both and the doctor needed to be notified.  Pt did take the 25 mg of librium and not the ativan.  After treatment team, pt was taken off the librium protocol and is now on ativan taper.  He denies any withdrawal symptoms thus far  He does c/o pain to his right leg/knee area and anxiety.  We discussed his medication changes and he voiced understanding.  He wants to leave by Friday and to follow-up at [facility] out-patient in [place].  He was insisted that this Clinical research associate wrote down what he said about leaving by Friday.

## 2012-02-10 NOTE — Progress Notes (Signed)
[facility] LCSW Aftercare Discharge Planning Group Note  02/10/2012 5:01 PM  Participation Quality:  Attentive  Affect:  Appropriate  Cognitive:  Alert and Oriented  Insight:  Good  Engagement in Group:  Engaged  Modes of Intervention:  Clarification, Exploration, Socialization and Support  Summary of Progress/Problems: Pt denies both suicidal and homicidal ideation.  On a scale of 1 to 10 with ten being the most ever experienced, the patient rates depression at a 1 and anxiety at a 9. Quandre shared that he experienced 3 week relapse after 6 months of sobriety and wanted to come in before it got worse. Patient also reports he wants referral to [facility] in [facility] as he doesn't feel he needs inpatient again after detox.      Clide Dales 02/10/2012, 5:01 PM

## 2012-02-10 NOTE — Progress Notes (Signed)
Patient ID: GLEB MCGUIRE, male   DOB: Aug 03, 1965, 47 y.o.   MRN: [id number]  PER STATE REGULATIONS 482.30  THIS CHART WAS REVIEWED FOR MEDICAL NECESSITY WITH RESPECT TO THE PATIENT'S ADMISSION/ DURATION OF STAY.  NEXT REVIEW DATE: 02/12/2012  Willa Rough, RN, BSN CASE MANAGER

## 2012-02-10 NOTE — Progress Notes (Addendum)
  Summary of Progress/Problems: [facility] LCSW Group Therapy  02/10/2012   Type of Therapy:  Group Therapy  Participation Level:  Active  Participation Quality:  Appropriate  Affect:  Appropriate  Cognitive:  Alert and Oriented  Insight:  Good  Engagement in Therapy:  Good  Modes of Intervention:  Clarification, Education, Exploration and Support  Summary of Progress/Problems: Group focus was emotional regulation and discussion involved ways in which patients have been vunerable to their negative emotions.  Patients related that often substance abuse occurred as a response to wanting to change emotions, or self medication. Patients discussed multiple ways to change emotions which all involved taking some sort of action.  Education was provided on how substances often alter the body's natural production of serotin and dopamine the natural mood stabalizing chemicals. Whitman actively participated and shared his experience of recent relapse after 6 months clean. " The relapse lasted for three weeks and I wanted to come in for help before it got real bad. Those 6 months were so good and I had lots of support that I have isolated myself from since I picked up a beer three weeks ago."  Bartlomiej also shared that when things get stressful he tends to bottle it up "yet I know others who would support me if I would just share what is going on."  Clide Dales 02/10/2012, 5:00 PM

## 2012-02-10 NOTE — H&P (Signed)
Psychiatric Admission Assessment Adult  Patient Identification:  [name] Date of Evaluation:  02/10/2012 Chief Complaint:  Alcohol Abuse  History of Present Illness: This is a 47 year old Caucasian male. Admitted to [facility] from the [facility] ED in [place], [place] with complaints of Alcohol intoxication requesting detoxification treatment. Patient reports, "My parents dropped me off at the [facility] last Monday because I was drunk. I had asked them to take me because I could not do it on my own. I started binge drinking about 8 months ago after 7 months of alcohol sobriety. I have been drinking alcohol since I was 12. I was in this hospital last June for alcohol detoxification also. I went to [facility] for alcohol treatmrnt as well. After drinking too much alcohol last Monday, I started to have the shakes and cold chills. But, I was not having seizures. I am not depressed or feeling depressed. I don't hear voices or see things either. I know that you are going to ask me these questions, so I go right ahead and answer them. I have not been sleeping well. I have no appetite either. I drink to feel good. I drink until I can't get any more drink. Beer is my choice of alcohol".  Elements:  Location:  [facility] adult unit. Quality:  "I binge drink everyday x 8 months".. Severity:  "I binge till I get real drunk". Timing:  "I started to drink again 8 months ago after 7 months sobriety.. Duration:  "I have been drinking since I was 12". Context:  DUI charges, Loss of driving privileges, alcohol related shakes.  Associated Signs/Synptoms:  Depression Symptoms:  anxiety, insomnia,  (Hypo) Manic Symptoms:  Denies hallucinations, delusions and or paranoia.  Anxiety Symptoms:  "I have a lot of anxiety"  Psychotic Symptoms:  Hallucinations: None  PTSD Symptoms: Had a traumatic exposure:  Denies  Psychiatric Specialty Exam: Physical Exam  Constitutional: He is oriented to person, place,  and time. He appears well-developed.  HENT:  Head: Normocephalic.  Eyes: Pupils are equal, round, and reactive to light.  Neck: Normal range of motion.  Cardiovascular: Normal rate.   Respiratory: Effort normal.  GI: Soft.  Musculoskeletal:       Limited ROM to right leg. Currently wears a right leg brace for support. Patient walks with a limp. Reports past surgical history of left knee & right hip replacements.  Neurological: He is alert and oriented to person, place, and time.  Skin: Skin is warm and dry.  Psychiatric: His speech is normal and behavior is normal. Judgment and thought content normal. His mood appears anxious. Thought content is not delusional. Cognition and memory are normal. He does not exhibit a depressed mood. He expresses no homicidal and no suicidal ideation.       Patient currently denies feeling and or being depressed.     Review of Systems  Constitutional: Positive for chills and diaphoresis.  HENT: Negative.   Eyes: Negative.   Respiratory: Negative.   Cardiovascular: Negative.   Gastrointestinal: Negative.   Genitourinary: Negative.   Musculoskeletal: Positive for joint pain and falls.       Patient reports a fall incident last Sunday, claimed Xray reports indicated no serious injuries and or any broken bones. Is currently wearing a right leg brace.   Skin: Negative.     Blood pressure 119/87, pulse 116, temperature 98 F (36.7 C), temperature source Oral, resp. rate 16, height 6\' 2"  (1.88 m), weight 78.926  kg (174 lb).Body mass index is 22.34 kg/(m^2).  General Appearance: Disheveled  Eye Contact::  Good  Speech:  Clear and Coherent  Volume:  Normal  Mood:  "I don't feel or am I depressed"  Affect:  Appropriate  Thought Process:  Coherent and Intact  Orientation:  Full (Time, Place, and Person)  Thought Content:  Denies hallucinations, delusions and or paranoia.  Suicidal Thoughts:  No  Homicidal Thoughts:  No  Memory:  Immediate;   Good Recent;    Good Remote;   Good  Judgement:  Impaired  Insight:  Lacking  Psychomotor Activity:  Tremor  Concentration:  Good  Recall:  Good  Akathisia:  No  Handed:  Right  AIMS (if indicated):     Assets:  Desire for Improvement  Sleep:  Number of Hours: 6.25     Past Psychiatric History: Diagnosis: Alcohol dependence, continuous  Hospitalizations: [facility] x 2,    Outpatient Care: Dr. Sander Radon in [place].  Substance Abuse Care: [facility] 2013  Self-Mutilation: Denies  Suicidal Attempts: Denies  Violent Behaviors: Denies   Past Medical History:   Past Medical History  Diagnosis Date  . Diabetic foot ulcer   . Pneumonia   . ETOH abuse   . Anxiety   . Arthritis   . Open wound     bottom of foot    Allergies:   Allergies  Allergen Reactions  . Benadryl (Diphenhydramine Hcl) Other (See Comments)    Pt states leg spasm   PTA Medications: Prescriptions prior to admission  Medication Sig Dispense Refill  . ibuprofen (ADVIL,MOTRIN) 200 MG tablet Take 200 mg by mouth every 6 (six) hours as needed. For pain      . LORazepam (ATIVAN) 2 MG tablet Take 2 mg by mouth 2 (two) times daily.       Marland Kitchen oxyCODONE-acetaminophen (PERCOCET) 7.5-325 MG per tablet Take 1 tablet by mouth every 4 (four) hours as needed for pain.  60 tablet  0    Previous Psychotropic Medications:  Medication/Dose  Lorazepam 1 mg               Substance Abuse History in the last 12 months:  yes  Consequences of Substance Abuse: Medical Consequences:  Liver damage, Possible death by overdose Legal Consequences:  Arrests, jail time, Loss of driving privilege. Family Consequences:  Family discord, divorce and or separation.   Social History:  reports that he has been smoking Cigarettes.  He has a 45 pack-year smoking history. He does not have any smokeless tobacco history on file. He reports that he drinks alcohol. He reports that he uses illicit drugs (Marijuana and Cocaine). Additional Social History:  Current  Place of Residence: [place], [place]   Place of Birth: [place], [place].  Family Members: "My son"  Marital Status:  Single  Children: 1  Sons: 1  Daughters: 0  Relationships: Single  Education:  [employer] Problems/Performance: Completed high school.  Religious Beliefs/Practices: None reported  History of Abuse (Emotional/Phsycial/Sexual): Denies  Occupational Experiences: Disabled  Military History:  None.  Legal History: No pending charges, however, hx of DUI, suspended license.  Hobbies/Interests: None reported.  Family History:  No family history on file.  Results for orders placed during the hospital encounter of 02/08/12 (from the past 72 hour(s))  CBC WITH DIFFERENTIAL     Status: Abnormal   Collection Time   02/08/12  5:36 PM      Component Value Range Comment   WBC 6.6  4.0 - 10.5 K/uL    RBC 5.72  4.22 - 5.81 MIL/uL    Hemoglobin 16.6  13.0 - 17.0 g/dL    HCT 16.1  09.6 - 04.5 %    MCV 82.3  78.0 - 100.0 fL    MCH 29.0  26.0 - 34.0 pg    MCHC 35.2  30.0 - 36.0 g/dL    RDW 40.9 (*) 81.1 - 15.5 %    Platelets 112 (*) 150 - 400 K/uL    Neutrophils Relative 52  43 - 77 %    Neutro Abs 3.5  1.7 - 7.7 K/uL    Lymphocytes Relative 36  12 - 46 %    Lymphs Abs 2.3  0.7 - 4.0 K/uL    Monocytes Relative 11  3 - 12 %    Monocytes Absolute 0.7  0.1 - 1.0 K/uL    Eosinophils Relative 1  0 - 5 %    Eosinophils Absolute 0.1  0.0 - 0.7 K/uL    Basophils Relative 1  0 - 1 %    Basophils Absolute 0.0  0.0 - 0.1 K/uL   ETHANOL     Status: Abnormal   Collection Time   02/08/12  5:36 PM      Component Value Range Comment   Alcohol, Ethyl (B) 240 (*) 0 - 11 mg/dL   PROTIME-INR     Status: Normal   Collection Time   02/08/12  5:41 PM      Component Value Range Comment   Prothrombin Time 11.7  11.6 - 15.2 seconds    INR 0.86  0.00 - 1.49   URINE RAPID DRUG SCREEN (HOSP PERFORMED)     Status: Normal   Collection Time   02/08/12  6:00 PM      Component Value  Range Comment   Opiates NONE DETECTED  NONE DETECTED    Cocaine NONE DETECTED  NONE DETECTED    Benzodiazepines NONE DETECTED  NONE DETECTED    Amphetamines NONE DETECTED  NONE DETECTED    Tetrahydrocannabinol NONE DETECTED  NONE DETECTED    Barbiturates NONE DETECTED  NONE DETECTED   HEPATIC FUNCTION PANEL     Status: Abnormal   Collection Time   02/08/12  7:20 PM      Component Value Range Comment   Total Protein 7.5  6.0 - 8.3 g/dL    Albumin 4.1  3.5 - 5.2 g/dL    AST 58 (*) 0 - 37 U/L    ALT 31  0 - 53 U/L    Alkaline Phosphatase 107  39 - 117 U/L    Total Bilirubin 1.0  0.3 - 1.2 mg/dL    Bilirubin, Direct 0.2  0.0 - 0.3 mg/dL    Indirect Bilirubin 0.8  0.3 - 0.9 mg/dL   LIPASE, BLOOD     Status: Normal   Collection Time   02/08/12  7:20 PM      Component Value Range Comment   Lipase 54  11 - 59 U/L   BASIC METABOLIC PANEL     Status: Abnormal   Collection Time   02/08/12  7:20 PM      Component Value Range Comment   Sodium 134 (*) 135 - 145 mEq/L    Potassium 3.6  3.5 - 5.1 mEq/L    Chloride 93 (*) 96 - 112 mEq/L    CO2 24  19 - 32 mEq/L    Glucose, Bld 77  70 - 99 mg/dL  BUN 5 (*) 6 - 23 mg/dL    Creatinine, Ser 4.69  0.50 - 1.35 mg/dL    Calcium 9.2  8.4 - 62.9 mg/dL    GFR calc non Af Amer >90  >90 mL/min    GFR calc Af Amer >90  >90 mL/min    Psychological Evaluations:  Assessment:   AXIS I:  Alcohol abuse with intoxication. AXIS II:  Deferred AXIS III:   Past Medical History  Diagnosis Date  . Diabetic foot ulcer   . Pneumonia   . ETOH abuse   . Anxiety   . Arthritis   . Open wound     bottom of foot   AXIS IV:  other psychosocial or environmental problems AXIS V:  1-10 persistent dangerousness to self and others present  Treatment Plan/Recommendations: 1. Admit for crisis management and stabilization, estimated length of stay 3-5 days.  2. Medication management to reduce current symptoms to base line and improve the patient's overall level of  functioning  3. Treat health problems as indicated.  4. Develop treatment plan to decrease risk of relapse upon discharge and the need for readmission.  5. Psycho-social education regarding relapse prevention and self care.  6. Health care follow up as needed for medical problems.  7. Review, reconcile, and reinstate any pertinent home medications for other health issues where appropriate. 8. Call for consults with hospitalist for any additional specialty patient care services as needed.   Treatment Plan Summary: Daily contact with patient to assess and evaluate symptoms and progress in treatment Medication management Current Medications:  Current Facility-Administered Medications  Medication Dose Route Frequency Provider Last Rate Last Dose  . acetaminophen (TYLENOL) tablet 650 mg  650 mg Oral Q6H PRN Kerry Hough, PA      . alum & mag hydroxide-simeth (MAALOX/MYLANTA) 200-200-20 MG/5ML suspension 30 mL  30 mL Oral Q4H PRN Kerry Hough, PA      . chlordiazePOXIDE (LIBRIUM) capsule 25 mg  25 mg Oral Q6H PRN Kerry Hough, PA      . chlordiazePOXIDE (LIBRIUM) capsule 25 mg  25 mg Oral QID Kerry Hough, PA   25 mg at 02/10/12 0815   Followed by  . chlordiazePOXIDE (LIBRIUM) capsule 25 mg  25 mg Oral TID Kerry Hough, PA       Followed by  . chlordiazePOXIDE (LIBRIUM) capsule 25 mg  25 mg Oral BH-qamhs Kerry Hough, PA       Followed by  . chlordiazePOXIDE (LIBRIUM) capsule 25 mg  25 mg Oral Daily Kerry Hough, PA      . cloNIDine (CATAPRES - Dosed in mg/24 hr) patch 0.1 mg  0.1 mg Transdermal Weekly Kerry Hough, PA   0.1 mg at 02/09/12 2148  . hydrOXYzine (ATARAX/VISTARIL) tablet 25 mg  25 mg Oral Q6H PRN Kerry Hough, PA      . ibuprofen (ADVIL,MOTRIN) tablet 600 mg  600 mg Oral Q6H PRN Kerry Hough, PA   600 mg at 02/10/12 0815  . loperamide (IMODIUM) capsule 2-4 mg  2-4 mg Oral PRN Kerry Hough, PA      . LORazepam (ATIVAN) tablet 2 mg  2 mg Oral BID  Rachael Fee, MD   2 mg at 02/09/12 2147  . magnesium hydroxide (MILK OF MAGNESIA) suspension 30 mL  30 mL Oral Daily PRN Kerry Hough, PA      . multivitamin with minerals tablet 1 tablet  1 tablet Oral  Daily Kerry Hough, PA   1 tablet at 02/10/12 [phone]  . nicotine (NICODERM CQ - dosed in mg/24 hours) patch 21 mg  21 mg Transdermal Q0600 Kerry Hough, PA   21 mg at 02/10/12 1914  . ondansetron (ZOFRAN-ODT) disintegrating tablet 4 mg  4 mg Oral Q6H PRN Kerry Hough, PA      . pneumococcal 23 valent vaccine (PNU-IMMUNE) injection 0.5 mL  0.5 mL Intramuscular Tomorrow-1000 Kerry Hough, PA      . thiamine (VITAMIN B-1) tablet 100 mg  100 mg Oral Daily Kerry Hough, PA   100 mg at 02/10/12 0811  . traZODone (DESYREL) tablet 50 mg  50 mg Oral QHS,MR X 1 Kerry Hough, PA   50 mg at 02/09/12 2147    Observation Level/Precautions:  15 minute checks  Laboratory:  Reviewed ED lab findings on file.  Psychotherapy:  Group sessions  Medications:  See medication lists.  Consultations: None indicated at this time.   Discharge Concerns:  Maintaining sobriety.  Estimated LOS: 5-7 days  Other:     I certify that inpatient services furnished can reasonably be expected to improve the patient's condition.   Armandina Stammer I 1/22/20149:36 AM

## 2012-02-11 DIAGNOSIS — F10229 Alcohol dependence with intoxication, unspecified: Secondary | ICD-10-CM

## 2012-02-11 DIAGNOSIS — F1010 Alcohol abuse, uncomplicated: Secondary | ICD-10-CM

## 2012-02-11 NOTE — H&P (Signed)
Agree with assessment and plan [place] Chelf A. Karina Nofsinger, M.D. 

## 2012-02-11 NOTE — Progress Notes (Signed)
Patient ID: [name], male DOB: 07/03/1990, 47 y.o. MRN: [id number]  D: Pt has been up and has been visible in milieu this evening, has been attending and participating in various milieu activities. Pt has had feelings of anxiety today, pt has denied any feelings of depression or hopelessness and has denied any suicidal thoughts. Pt does still remain slightly tremulous and has spoken about being ready for discharge tomorrow afternoon. A: Encouragement and support was offered. 15 min checks continued for safety.  R: Pt remains safe.

## 2012-02-11 NOTE — Progress Notes (Signed)
[facility] LCSW Aftercare Discharge Planning Group Note  02/11/2012 1:06 PM  Participation Quality:  Appropriate  Affect:  Appropriate  Cognitive:  Alert and Oriented  Insight:  Improving  Engagement in Group:  Improving  Modes of Intervention:  Clarification, Exploration and Support  Summary of Progress/Problems: Pt denies both suicidal and homicidal ideation.  On a scale of 1 to 10 with ten being the most ever experienced, the patient rates depression at a 1 and anxiety at a 5. Patient reports that Dr has told him he can be discharged on Thursday and parents are scheduled to pick him up at 1 PM.   CSW will verufy.     Clide Dales 02/11/2012, 1:06 PM

## 2012-02-11 NOTE — Progress Notes (Signed)
[facility] MD Progress Note  02/11/2012 10:46 AM [name]  MRN:  [id number]  Subjective:  "I'm doing okay. My mood is good. But I'm still anxious and has some tremors still, more to my right hand. But the withdrawal symptom is tolerable. My right knee is hurting. I have taken some Ibuprofen. I guess I have to wait for the medicine to kick in. I was told that I will be going home tomorrow afternoon. I think I'm ready to go home too. I do know what to do stop drinking, it is just hard to do it. And most of the time, I drink to stop me from shaking".  Diagnosis:   Axis I: Alcohol dependence with intoxication, Alcohol abuse continuous. Axis II: Deferred Axis III:  Past Medical History  Diagnosis Date  . Diabetic foot ulcer   . Pneumonia   . ETOH abuse   . Anxiety   . Arthritis   . Open wound     bottom of foot   Axis IV: other psychosocial or environmental problems Axis V: 51-60 moderate symptoms  ADL's:  Fair  Sleep: Good  Appetite:  Good  Suicidal Ideation:  Plan:  No Intent:  No Means:  No Homicidal Ideation:  Plan:  No Intent:  No Means:  No  AEB (as evidenced by): Per patient's reports.  Psychiatric Specialty Exam: Review of Systems  HENT: Negative.   Eyes: Negative.   Respiratory: Negative.   Cardiovascular: Negative.   Gastrointestinal: Negative.   Genitourinary: Negative.   Musculoskeletal: Positive for joint pain.       Wear right leg brace.   Skin: Negative.   Neurological: Positive for tremors (Mild.). Negative for dizziness, tingling, sensory change, speech change, focal weakness and seizures.  Endo/Heme/Allergies: Negative.   Psychiatric/Behavioral: Positive for substance abuse (Alcoho;ism). Negative for depression, suicidal ideas, hallucinations and memory loss. The patient is nervous/anxious. The patient does not have insomnia.     Blood pressure 127/87, pulse 105, temperature 97.9 F (36.6 C), temperature source Oral, resp. rate 18, height 6\' 2"   (1.88 m), weight 78.926 kg (174 lb).Body mass index is 22.34 kg/(m^2).  General Appearance: Disheveled and wears right leg brace for support.  Eye Contact::  Good  Speech:  Clear and Coherent  Volume:  Normal  Mood:  Euthymic  Affect:  Appropriate and Congruent  Thought Process:  Coherent, Intact and Logical  Orientation:  Full (Time, Place, and Person)  Thought Content:  Rumination about being an alcoholic.  Suicidal Thoughts:  No  Homicidal Thoughts:  No  Memory:  Immediate;   Good Recent;   Good Remote;   Good  Judgement:  Good  Insight:  Fair  Psychomotor Activity:  Tremor  Concentration:  Good  Recall:  Good  Akathisia:  No  Handed:  Right  AIMS (if indicated):     Assets:  Desire for Improvement  Sleep:  Number of Hours: 5.25    Current Medications: Current Facility-Administered Medications  Medication Dose Route Frequency Provider Last Rate Last Dose  . acetaminophen (TYLENOL) tablet 650 mg  650 mg Oral Q6H PRN Kerry Hough, PA   650 mg at 02/10/12 2154  . alum & mag hydroxide-simeth (MAALOX/MYLANTA) 200-200-20 MG/5ML suspension 30 mL  30 mL Oral Q4H PRN Kerry Hough, PA      . cloNIDine (CATAPRES - Dosed in mg/24 hr) patch 0.1 mg  0.1 mg Transdermal Weekly Kerry Hough, PA   0.1 mg at 02/09/12 2148  . hydrOXYzine (  ATARAX/VISTARIL) tablet 25 mg  25 mg Oral Q6H PRN Sanjuana Kava, NP      . ibuprofen (ADVIL,MOTRIN) tablet 800 mg  800 mg Oral Q8H PRN Rachael Fee, MD   800 mg at 02/11/12 0256  . loperamide (IMODIUM) capsule 2-4 mg  2-4 mg Oral PRN Sanjuana Kava, NP      . LORazepam (ATIVAN) tablet 1 mg  1 mg Oral TID Sanjuana Kava, NP   1 mg at 02/11/12 2841   Followed by  . LORazepam (ATIVAN) tablet 1 mg  1 mg Oral BID Sanjuana Kava, NP       Followed by  . LORazepam (ATIVAN) tablet 1 mg  1 mg Oral Daily Sanjuana Kava, NP      . LORazepam (ATIVAN) tablet 1 mg  1 mg Oral Q6H PRN Sanjuana Kava, NP      . mirtazapine (REMERON) tablet 30 mg  30 mg Oral QHS Rachael Fee, MD   30 mg at 02/10/12 2154  . multivitamin with minerals tablet 1 tablet  1 tablet Oral Daily Kerry Hough, PA   1 tablet at 02/11/12 3244  . nicotine (NICODERM CQ - dosed in mg/24 hours) patch 21 mg  21 mg Transdermal Q0600 Kerry Hough, PA   21 mg at 02/11/12 0102  . ondansetron (ZOFRAN-ODT) disintegrating tablet 4 mg  4 mg Oral Q6H PRN Sanjuana Kava, NP      . thiamine (VITAMIN B-1) tablet 100 mg  100 mg Oral Daily Kerry Hough, PA   100 mg at 02/11/12 7253  . traZODone (DESYREL) tablet 50 mg  50 mg Oral QHS,MR X 1 Kerry Hough, PA   50 mg at 02/10/12 2154    Lab Results: No results found for this or any previous visit (from the past 48 hour(s)).  Physical Findings: AIMS: Facial and Oral Movements Muscles of Facial Expression: None, normal Lips and Perioral Area: None, normal Jaw: None, normal Tongue: None, normal,Extremity Movements Upper (arms, wrists, hands, fingers): None, normal Lower (legs, knees, ankles, toes): None, normal, Trunk Movements Neck, shoulders, hips: None, normal, Overall Severity Severity of abnormal movements (highest score from questions above): None, normal Incapacitation due to abnormal movements: None, normal Patient's awareness of abnormal movements (rate only patient's report): No Awareness, Dental Status Current problems with teeth and/or dentures?: No Does patient usually wear dentures?: No  CIWA:  CIWA-Ar Total: 2  COWS:     Treatment Plan Summary: Daily contact with patient to assess and evaluate symptoms and progress in treatment Medication management  Plan:  Supportive approach/coping skills. Possible discharge to home on 02/12/12 am. Encouraged out of room, participation in group sessions and application of coping skills when distressed. Will continue to monitor response to/adverse effects of medications in use to assure effectiveness. Continue to monitor mood, behavior and interaction with staff and other patients. Continue  current plan of care.   Medical Decision Making Problem Points:  Established problem, stable/improving (1), Review of last therapy session (1) and Review of psycho-social stressors (1) Data Points:  Review of medication regiment & side effects (2) Review of new medications or change in dosage (2)  I certify that inpatient services furnished can reasonably be expected to improve the patient's condition.   Armandina Stammer I 02/11/2012, 10:46 AM

## 2012-02-11 NOTE — [facility] Counselor (Signed)
Adult Comprehensive Assessment  Patient ID: [name], male   DOB: Aug 23, 1965, 47 y.o.   MRN: [id number]  Information Source: Information source: Patient  Current Stressors:  Educational / Learning stressors: NA Employment / Job issues: On Disability Family Relationships: NA when sober; strained when drinking Financial / Lack of resources (include bankruptcy): Tight but getting by Housing / Lack of housing: NA Physical health (include injuries & life threatening diseases): Arthritis and multiple joint replacement surgeries; right knee replacement needed Social relationships: NA Substance abuse: History of alcohol abuse and recent relapse Bereavement / Loss: NA  Living/Environment/Situation:  Living Arrangements: Spouse/significant other Living conditions (as described by patient or guardian): Good How long has patient lived in current situation?: 2 years What is atmosphere in current home: Comfortable  Family History:  Marital status: Long term relationship Long term relationship, how long?: 2 years What types of issues is patient dealing with in the relationship?: Pt reports relationship is "iffy" Additional relationship information: Problems include financial concerns and patient's drinking Does patient have children?: No  Childhood History:  By whom was/is the patient raised?: Both parents Additional childhood history information: Parents are still together Description of patient's relationship with caregiver when they were a child: "All good" Patient's description of current relationship with people who raised him/her: "remains good except when I drink" Does patient have siblings?: Yes Number of Siblings: 1  Description of patient's current relationship with siblings: good w older sister Did patient suffer any verbal/emotional/physical/sexual abuse as a child?: No Did patient suffer from severe childhood neglect?: No Has patient ever been sexually abused/assaulted/raped  as an adolescent or adult?: No Was the patient ever a victim of a crime or a disaster?: No Witnessed domestic violence?: No Has patient been effected by domestic violence as an adult?: No  Education:  Highest grade of school patient has completed: 16 years Currently a student?: No Learning disability?: No  Employment/Work Situation:   Employment situation: On disability Why is patient on disability: Multiple joint issues including replacement surgery and arthritis How long has patient been on disability: 3 years Patient's job has been impacted by current illness: No What is the longest time patient has a held a job?: 27 years 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  Drinking 15-20 12 ounce beers a day; well, basically a case but it was real important to have a few left in the frig for during the night and in  the am If attempted suicide, did drugs/alcohol play a role in this?:  (No Attempt) Alcohol/Substance Abuse Treatment Hx: Past Tx, Inpatient;Past detox If yes, describe treatment: [facility] for [facility] multiple times; [facility] days at [facility] Has alcohol/substance abuse ever caused legal problems?: Yes (DUI, currently has no license, needs to get assessment, pay fine for reinstatement and purchase breath monitor & install)  Social Support System:   Patient's Community Support System: Good Describe Community Support System: Girlfriend, sister and parents Type of faith/religion: Baptist How does patient's faith help to cope with current illness?: Want to be the best Christian I can be  Leisure/Recreation:   Leisure  and Hobbies: Patient  enjoys being outside, winter is hard, too much time inside watching TV  Strengths/Needs:   What things does the patient do well?: Good electrician, good gardner In what areas does patient struggle / problems for patient: Winter, no license and boredom  Discharge Plan:   Does patient have access to transportation?: Yes Will patient be returning to same living situation after discharge?: Yes Currently receiving community mental health services: No If no, would patient like referral for services when discharged?: Yes (What county?) [place]) Does patient have financial barriers related to discharge medications?: No (Not if covered by insurance)  Summary/Recommendations:   Summary and Recommendations (to be completed by the evaluator): Patient is 47 YO single disabled caucasian male admitted with diagnosis of Alcohol Abuse.  Patient recently had three to four week relapse after achieving 5 months of clean time. Patient will benefit from crisis stabilization, medication evaluation, group therapy and psycho education in addition to discharge planning.   Clide Dales. 02/11/2012

## 2012-02-11 NOTE — Progress Notes (Signed)
[facility] LCSW Group Therapy  02/11/2012   Type of Therapy:  Group Therapy  Participation Level:  Active  Participation Quality:  Appropriate, Attentive and Sharing  Affect:  Appropriate  Cognitive:  Appropriate  Insight:  Improving  Engagement in Therapy:  Engaged  Modes of Intervention:  Discussion, Rapport Building, Socialization and Support  Summary of Progress/Problems:  Today's group topic was 'Balance in Life.' Discussion began with group members sharing what the word balance means to them and group discussion flowed from there into stressors that affect balance and what individuals can to to maintain balance in the face of stressors.  Patient was able to share that balance feels like "all the wheels are going in the same direction.  You know how hard it is to drive a car that is out of balance? Well that's how my life feels sometimes."  Patient was able to process that just like a care can need an adjustment/balance he can make adjustments without turning to alcohol.  Patient was also able to process how he can stay sober whether the weather permits outdoor activity such as yard work or not.  Clide Dales 02/11/2012

## 2012-02-11 NOTE — Progress Notes (Signed)
Patient ID: [name], male   DOB: [birth year], 47 y.o.   MRN: [id number] He has been up and to groups interacting with peers and staff. Self inventory:  Depression and hopelessness at 1, w/d agitation denies SI thoughts.

## 2012-02-12 MED ORDER — CLONIDINE HCL 0.1 MG/24HR TD PTWK
1.0000 | MEDICATED_PATCH | TRANSDERMAL | Status: DC
Start: 2012-02-12 — End: 2012-03-02

## 2012-02-12 MED ORDER — TRAZODONE HCL 50 MG PO TABS: 50.0000 mg | ORAL_TABLET | Freq: Every evening | ORAL | Status: DC | PRN

## 2012-02-12 MED ORDER — MIRTAZAPINE 30 MG PO TABS: 30.0000 mg | ORAL_TABLET | Freq: Every day | ORAL | Status: DC

## 2012-02-12 MED ORDER — HYDROXYZINE HCL 25 MG PO TABS: 25.0000 mg | ORAL_TABLET | Freq: Four times a day (QID) | ORAL | Status: DC | PRN

## 2012-02-12 NOTE — Progress Notes (Signed)
[facility] Group Notes:  (Nursing/MHT/Case Management/Adjunct)  Date:  02/12/2012  Time:  2:43 PM  Type of Therapy:  Therapeutic Activity  Participation Level:  Active  Participation Quality:  Appropriate, Sharing and Supportive  Affect:  Appropriate and Excited  Cognitive:  Alert and Appropriate  Insight:  Appropriate and Good  Engagement in Group:  Engaged and Supportive  Modes of Intervention:  Activity, Discussion and Support  Summary of Progress/Problems:  Pt attended a therapeutic activity group in which the group played a game of coping skills pictionary. Pt participated actively by volunteering numerous times to draw coping skills despite limited peer engagement in the activity.   Reinaldo Raddle K 02/12/2012, 2:43 PM

## 2012-02-12 NOTE — Progress Notes (Signed)
Patient ID: [name], male   DOB: 12/11/1965, 47 y.o.   MRN: [id number] 02-12-12 @ 1312 nursing discharge note: pt discharge note was written. He stated he understood his d/c instructions, signed for his belongings and got his d/c scripts. He was escorted to the door. His parent will transport him home.

## 2012-02-12 NOTE — Tx Team (Signed)
Interdisciplinary Treatment Plan Update (Adult)  Date: 02/12/2012  Time Reviewed: 9:45 AM   Progress in Treatment: Attending groups: Yes Participating in groups: Yes Taking medication as prescribed:  Yes Tolerating medication:  Yes Family/Significant othe contact made: No Patient understands diagnosis: Yes Discussing patient identified problems/goals with staff: Yes Medical problems stabilized or resolved:  Yes Denies suicidal/homicidal ideation: Yes Issues/concerns per patient self-inventory: None Other: N/A  New problem(s) identified: None Identified  Reason for Continuation of Hospitalization: Patient discharging  Interventions implemented related to continuation of hospitalization: NA  Additional comments: N/A  Estimated length of stay: Discharging today  Discharge Plan: Followup scheduled at [facility] in [facility]): N/A  Review of initial/current patient goals per problem list:  None, ready for discharge  1. Goal (s): Reduce anxiety symptoms from a 10 to a 3 - 5 range  Met: Yes As evidenced by: Pt rates at a 4  2.  Goal(s): Complete detox protocol and  Identify comprehensive mental wellness and sobriety planGoal(s):   Met: Yes  As evidenced by Patient report, detox protocol completed and follow up set at [facility] in [place]                                                                  3. Goals: Patient will be able to identify effective coping patterns  Met: Yes  As evidenced by: Patient processed that his tendency to isolate needs to be compensated by accessing supports such as community of AA and therapist. Patient also recognizes need to have a daily plan verses "letting the day just happen to me"  Attendees: Patient:     Family:     Physician:  Geoffery Lyons 02/12/2012 9:45 AM   Nursing:   Isaac Laud, RN 02/12/2012 9:45 AM   Clinical Social Worker Ronda Fairly 02/12/2012 9:45 AM   Other:   02/12/2012 9:45 AM   Other:   02/12/2012 9:45 AM     Other:   02/12/2012 9:45 AM   Other:   02/12/2012 9:45 AM    Scribe for Treatment Team:   Carney Bern, LCSWA  02/12/2012 12:18 PM

## 2012-02-12 NOTE — Progress Notes (Signed)
[facility] Adult Case Management Discharge Plan :  Will you be returning to the same living situation after discharge: Yes,  home with significant other At discharge, do you have transportation home?:Yes,  family Do you have the ability to pay for your medications:Yes,    Release of information consent forms completed and in the chart;  Patient's signature needed at discharge.  Patient to Follow up at: Follow-up Information    Follow up with [facility]. On 02/16/2012. (Appointment on Tuesday 1/28 at 8:30 AM)    Contact information:   [facility] [address] [place] [address] [phone]         Patient denies SI/HI:   Yes,  denies both    Safety Planning and Suicide Prevention discussed:  Yes,  including Mobile Crisis Management  Clide Dales 02/12/2012, 12:14 PM

## 2012-02-12 NOTE — Progress Notes (Signed)
Patient ID: [name], male   DOB: 12-Oct-1965, 47 y.o.   MRN: [id number] D: No new data from previous assessment. Patient in bed sleeping. Respiration regular and unlabored. No sign of distress noted at this time A: 15 mins checks for  Safety. R: Patient remains asleep. Pt is safe.

## 2012-02-12 NOTE — Discharge Summary (Signed)
Physician Discharge Summary Note  Patient:  [name] is an 47 y.o., male MRN:  [id number] DOB:  1965/05/12 Patient phone:  [phone] (home)  Patient address:   [address] [place] [address],   Date of Admission:  02/09/2012 Date of Discharge: 02/12/2012  Reason for Admission:  Alcohol detox/dependency  Discharge Diagnoses: Active Problems:  Alcohol abuse, continuous  Review of Systems  Constitutional: Negative.   HENT: Negative.   Eyes: Negative.   Respiratory: Negative.   Cardiovascular: Negative.   Gastrointestinal: Negative.   Genitourinary: Negative.   Musculoskeletal: Negative.   Skin: Negative.   Neurological: Negative.   Endo/Heme/Allergies: Negative.   Psychiatric/Behavioral: Positive for substance abuse.   Axis Diagnosis:   AXIS I:  Alcohol Abuse, Substance Abuse and Substance Induced Mood Disorder AXIS II:  Deferred AXIS III:   Past Medical History  Diagnosis Date  . Diabetic foot ulcer   . Pneumonia   . ETOH abuse   . Anxiety   . Arthritis   . Open wound     bottom of foot   AXIS IV:  economic problems, other psychosocial or environmental problems, problems related to social environment and problems with primary support group AXIS V:  61-70 mild symptoms  Level of Care:  OP  Hospital Course:  Review of chart, vital signs, medications, and notes. 1-Admitted for crisis management and stabilization, completed and stable for discharge. 2-Individual and group therapy attended 3-Medication managed for substance abuse/detox and anxiety to reduce current symptoms to base line and improve the patient's overall level of functioning:  Medications reviewed with the patient and he stated no untoward effects, alcohol detox medically managed successfully, Rx and 7 day supply of medications given at discharge 4-Coping skills for alcohol abuse and anxiety developed and utilized 5-Addressed health issues--blood pressures stable 7-Treatment plan in place  to prevent relapse of alcohol abuse and anxiety 8-Psychosocial education regarding relapse prevention and self-care completed 9-Health care follow up as needed for blood pressure 10-Patient denied suicidal/homicidal ideations and auditory/visual hallucinations, follow-up appointments encouraged to attend, outside support groups encouraged and information given   Consults:  None  Significant Diagnostic Studies:  labs: Completed and reviewed, stable  Discharge Vitals:   Blood pressure 135/88, pulse 116, temperature 98 F (36.7 C), temperature source Oral, resp. rate 24, height 6\' 2"  (1.88 m), weight 78.926 kg (174 lb). Body mass index is 22.34 kg/(m^2). Lab Results:   No results found for this or any previous visit (from the past 72 hour(s)).  Physical Findings: AIMS: Facial and Oral Movements Muscles of Facial Expression: None, normal Lips and Perioral Area: None, normal Jaw: None, normal Tongue: None, normal,Extremity Movements Upper (arms, wrists, hands, fingers): None, normal Lower (legs, knees, ankles, toes): None, normal, Trunk Movements Neck, shoulders, hips: None, normal, Overall Severity Severity of abnormal movements (highest score from questions above): None, normal Incapacitation due to abnormal movements: None, normal Patient's awareness of abnormal movements (rate only patient's report): No Awareness, Dental Status Current problems with teeth and/or dentures?: No Does patient usually wear dentures?: No  CIWA:  CIWA-Ar Total: 0  COWS:     Psychiatric Specialty Exam: See Psychiatric Specialty Exam and Suicide Risk Assessment completed by Attending Physician prior to discharge.  Discharge destination:  Home  Is patient on multiple antipsychotic therapies at discharge:  No   Has Patient had three or more failed trials of antipsychotic monotherapy by history:  No Recommended Plan for Multiple Antipsychotic Therapies:  N/A  Discharge Orders  Future Orders Please  Complete By Expires   Diet - low sodium heart healthy      Activity as tolerated - No restrictions          Medication List     As of 02/12/2012 11:11 AM    STOP taking these medications         ibuprofen 200 MG tablet   Commonly known as: ADVIL,MOTRIN      LORazepam 2 MG tablet   Commonly known as: ATIVAN      oxyCODONE-acetaminophen 7.5-325 MG per tablet   Commonly known as: PERCOCET      TAKE these medications      Indication    cloNIDine 0.1 mg/24hr patch   Commonly known as: CATAPRES - Dosed in mg/24 hr   Place 1 patch (0.1 mg total) onto the skin once a week.    Indication: High Blood Pressure      hydrOXYzine 25 MG tablet   Commonly known as: ATARAX/VISTARIL   Take 1 tablet (25 mg total) by mouth every 6 (six) hours as needed for anxiety (or CIWA score </= 10).    Indication: anxiety      mirtazapine 30 MG tablet   Commonly known as: REMERON   Take 1 tablet (30 mg total) by mouth at bedtime.    Indication: Trouble Sleeping      traZODone 50 MG tablet   Commonly known as: DESYREL   Take 1 tablet (50 mg total) by mouth at bedtime and may repeat dose one time if needed.    Indication: Trouble Sleeping        Follow-up recommendations:  Activity as tolerated, low sodium heart healthy diet  Comments:  Patient will discharge and attend his follow-up appointments, AA encouraged along with obtaining a sponsor  Total Discharge Time:  Greater than 30 minutes  Signed: Nanine Means, PMH-NP 02/12/2012, 11:11 AM

## 2012-02-12 NOTE — Progress Notes (Signed)
Patient did attend the evening karaoke group.  Pt was attentive, supportive, and engaged.

## 2012-02-12 NOTE — [facility] Suicide Risk Assessment (Signed)
Suicide Risk Assessment  Discharge Assessment     Demographic Factors:  Male and Caucasian  Mental Status Per Nursing Assessment::   On Admission:  NA  Current Mental Status by Physician: In full contact with reality. There are no suicidal ideas, plans or intent. His mood is euthymic his affect is appropriate. He is fully detox. He is still having issues and concerns with his leg, but has an appointment really soon with his orthopedist. He is planning to abstain from using alcohol   Loss Factors: Decline in physical health and Financial problems/change in socioeconomic status  Historical Factors: NA  Risk Reduction Factors:   Sense of responsibility to family, Living with another person, especially a relative and Positive social support  Continued Clinical Symptoms:  Alcohol/Substance Abuse/Dependencies  Cognitive Features That Contribute To Risk: No evidence   Suicide Risk:  Minimal: No identifiable suicidal ideation.  Patients presenting with no risk factors but with morbid ruminations; may be classified as minimal risk based on the severity of the depressive symptoms  Discharge Diagnoses:   AXIS I:  Alcohol Dependence, Anxiety Disorder NOS AXIS II:  Deferred AXIS III:   Past Medical History  Diagnosis Date  . Diabetic foot ulcer   . Pneumonia   . ETOH abuse   . Anxiety   . Arthritis   . Open wound     bottom of foot   AXIS IV:  other psychosocial or environmental problems AXIS V:  61-70 mild symptoms  Plan Of Care/Follow-up recommendations:  Activity:  AS tolerated Diet:  Regular Continue outpatient follow up/AA/sponsor Is patient on multiple antipsychotic therapies at discharge:  No   Has Patient had three or more failed trials of antipsychotic monotherapy by history:  No  Recommended Plan for Multiple Antipsychotic Therapies: N/A   Marella Vanderpol A 02/12/2012, 12:01 PM

## 2012-02-16 NOTE — Progress Notes (Signed)
Patient Discharge Instructions:  After Visit Summary (AVS):   Faxed to:  02/16/12 Discharge Summary Note:   Faxed to:  02/16/12 Psychiatric Admission Assessment Note:   Faxed to:  02/16/12 Suicide Risk Assessment - Discharge Assessment:   Faxed to:  02/16/12 Faxed/Sent to the Next Level Care provider:  02/16/12 Faxed to [facility] @ [phone]  Jerelene Redden, 02/16/2012, 3:50 PM

## 2012-02-23 NOTE — Discharge Summary (Signed)
Agree with assessment and plan Shiya Fogelman A. Yobany Vroom, M.D. 

## 2012-03-02 ENCOUNTER — Emergency Department (HOSPITAL_COMMUNITY): Payer: Medicare Other

## 2012-03-02 ENCOUNTER — Emergency Department (HOSPITAL_COMMUNITY)
Admission: EM | Admit: 2012-03-02 | Discharge: 2012-03-02 | Disposition: A | Discharge status: Home / Self Care | Payer: Medicare Other | Attending: Emergency Medicine | Admitting: Emergency Medicine

## 2012-03-02 ENCOUNTER — Encounter (HOSPITAL_COMMUNITY): Payer: Self-pay

## 2012-03-02 DIAGNOSIS — Z79899 Other long term (current) drug therapy: Secondary | ICD-10-CM | POA: Insufficient documentation

## 2012-03-02 DIAGNOSIS — F1210 Cannabis abuse, uncomplicated: Secondary | ICD-10-CM | POA: Insufficient documentation

## 2012-03-02 DIAGNOSIS — Z96659 Presence of unspecified artificial knee joint: Secondary | ICD-10-CM | POA: Insufficient documentation

## 2012-03-02 DIAGNOSIS — Z9889 Other specified postprocedural states: Secondary | ICD-10-CM | POA: Insufficient documentation

## 2012-03-02 DIAGNOSIS — Y929 Unspecified place or not applicable: Secondary | ICD-10-CM | POA: Insufficient documentation

## 2012-03-02 DIAGNOSIS — M25461 Effusion, right knee: Secondary | ICD-10-CM

## 2012-03-02 DIAGNOSIS — L97509 Non-pressure chronic ulcer of other part of unspecified foot with unspecified severity: Secondary | ICD-10-CM | POA: Insufficient documentation

## 2012-03-02 DIAGNOSIS — Z87828 Personal history of other (healed) physical injury and trauma: Secondary | ICD-10-CM | POA: Insufficient documentation

## 2012-03-02 DIAGNOSIS — Y93E5 Activity, floor mopping and cleaning: Secondary | ICD-10-CM | POA: Insufficient documentation

## 2012-03-02 DIAGNOSIS — M25469 Effusion, unspecified knee: Secondary | ICD-10-CM | POA: Insufficient documentation

## 2012-03-02 DIAGNOSIS — R296 Repeated falls: Secondary | ICD-10-CM | POA: Insufficient documentation

## 2012-03-02 DIAGNOSIS — S6990XA Unspecified injury of unspecified wrist, hand and finger(s), initial encounter: Secondary | ICD-10-CM | POA: Insufficient documentation

## 2012-03-02 DIAGNOSIS — F411 Generalized anxiety disorder: Secondary | ICD-10-CM | POA: Insufficient documentation

## 2012-03-02 DIAGNOSIS — E1169 Type 2 diabetes mellitus with other specified complication: Secondary | ICD-10-CM | POA: Insufficient documentation

## 2012-03-02 DIAGNOSIS — F17200 Nicotine dependence, unspecified, uncomplicated: Secondary | ICD-10-CM | POA: Insufficient documentation

## 2012-03-02 DIAGNOSIS — S79929A Unspecified injury of unspecified thigh, initial encounter: Secondary | ICD-10-CM | POA: Insufficient documentation

## 2012-03-02 DIAGNOSIS — S8990XA Unspecified injury of unspecified lower leg, initial encounter: Secondary | ICD-10-CM | POA: Insufficient documentation

## 2012-03-02 DIAGNOSIS — Z8701 Personal history of pneumonia (recurrent): Secondary | ICD-10-CM | POA: Insufficient documentation

## 2012-03-02 DIAGNOSIS — W19XXXA Unspecified fall, initial encounter: Secondary | ICD-10-CM

## 2012-03-02 DIAGNOSIS — M129 Arthropathy, unspecified: Secondary | ICD-10-CM | POA: Insufficient documentation

## 2012-03-02 DIAGNOSIS — S79919A Unspecified injury of unspecified hip, initial encounter: Secondary | ICD-10-CM | POA: Insufficient documentation

## 2012-03-02 HISTORY — DX: Type 2 diabetes mellitus without complications: E11.9

## 2012-03-02 MED ORDER — OXYCODONE-ACETAMINOPHEN 5-325 MG PO TABS
2.0000 | ORAL_TABLET | Freq: Once | ORAL | Status: AC
  Administered 2012-03-02: 2 via ORAL
  Filled 2012-03-02: qty 2

## 2012-03-02 MED ORDER — LIDOCAINE HCL (PF) 2 % IJ SOLN
INTRAMUSCULAR | Status: AC
  Filled 2012-03-02: qty 10

## 2012-03-02 MED ORDER — OXYCODONE-ACETAMINOPHEN 5-325 MG PO TABS: 1.0000 | ORAL_TABLET | ORAL | Status: DC | PRN

## 2012-03-02 NOTE — ED Provider Notes (Signed)
History     CSN: [id number]  Arrival date & time 03/02/12  1478   First MD Initiated Contact with Patient 03/02/12 0930      Chief Complaint  Patient presents with  . Fall    (Consider location/radiation/quality/duration/timing/severity/associated sxs/prior treatment) HPI Comments: [name] presents for evaluation of a fall he took this am when his right knee "gave out" on him. He has pain in the knee,  As he landed directly on it, and also caught his right outstretched hand and right hip during the fall.  He denies head injury and loc.  He has severe djd in the right knee and is anticipation a knee replacement surgery by Dr.  Lajoyce Corners in the near future.  He denies numbness or weakness distal to the injury sites.  He has had no medicines prior to arrival.  He has a history of etoh abuse,  Diabetes and severe osteoarthritis with prior joint replacments in the left knee and right hip.  He reports a larger effusion than normal in his right knee since todays fall.   The history is provided by the patient.    Past Medical History  Diagnosis Date  . Diabetic foot ulcer   . Pneumonia   . ETOH abuse   . Anxiety   . Arthritis   . Open wound     bottom of foot  . Diabetes mellitus without complication     Past Surgical History  Procedure Laterality Date  . Total hip arthroplasty    . Total knee arthroplasty    . Joint replacement    . Lung lobectomy    . Hip surgery    . Knee surgery    . Lung lobectomy    . Metatarsal osteotomy  10/29/2011    Procedure: METATARSAL OSTEOTOMY;  Surgeon: Nadara Mustard, MD;  Location: [facility] OR;  Service: Orthopedics;  Laterality: Left;  Left 1st Metatarsal Dorsal Closing Wedge     No family history on file.  History  Substance Use Topics  . Smoking status: Current Every Day Smoker -- 1.50 packs/day for 30 years    Types: Cigarettes  . Smokeless tobacco: Not on file  . Alcohol Use: 0.0 oz/week     Comment: former, quit drinking approx 1 month  ago.      Review of Systems  Constitutional: Negative for fever.  Respiratory: Negative for shortness of breath.   Cardiovascular: Negative for chest pain and leg swelling.  Gastrointestinal: Negative for abdominal pain, constipation and abdominal distention.  Genitourinary: Negative for dysuria, urgency, frequency, flank pain and difficulty urinating.  Musculoskeletal: Positive for joint swelling and arthralgias. Negative for gait problem.  Skin: Negative for color change and rash.  Neurological: Negative for weakness and numbness.    Allergies  Benadryl and Trazodone and nefazodone  Home Medications   Current Outpatient Rx  Name  Route  Sig  Dispense  Refill  . hydrOXYzine (ATARAX/VISTARIL) 25 MG tablet   Oral   Take 25 mg by mouth every 6 (six) hours as needed for anxiety (or CIWA score </= 1).         . ibuprofen (ADVIL,MOTRIN) 200 MG tablet   Oral   Take 400 mg by mouth every 6 (six) hours as needed for pain.         . mirtazapine (REMERON) 30 MG tablet   Oral   Take 30 mg by mouth at bedtime as needed (Sleep).  BP 143/84  Pulse 99  Temp(Src) 98 F (36.7 C) (Oral)  Resp 18  Ht 6\' 3"  (1.905 m)  Wt 175 lb (79.379 kg)  BMI 21.87 kg/m2  SpO2 98%  Physical Exam  Constitutional: He appears well-developed and well-nourished.  HENT:  Head: Normocephalic and atraumatic.  Neck: Normal range of motion.  Cardiovascular: Normal rate.   Pulses equal bilaterally  Pulmonary/Chest: Effort normal.  Musculoskeletal: He exhibits edema and tenderness.  Patient with moderate effusion right knee.  No ligamentous instability.  Pain with varus strain along bilateral knee joint spaces.  Right hand with pain across dorsal metacarpals 2,3 and 4 without edema,  No Erythema or ecchymosis, no increased warmth.  Pt can flex hand with minimal discomfort.  Radial pulse full.  TTP along right lateral pelvic rim.  No pain at the groin with internal and external rotation of the  leg.  Neurological: He is alert. He has normal strength. He displays normal reflexes. No sensory deficit.  Equal strength  Skin: Skin is warm, dry and intact. No abrasion, no bruising and no ecchymosis noted. No erythema.  Psychiatric: He has a normal mood and affect.    ED Course  Procedures (including critical care time)  Labs Reviewed  GLUCOSE, CAPILLARY - Abnormal; Notable for the following:    Glucose-Capillary 127 (*)    All other components within normal limits   Dg Hip Complete Right  03/02/2012  *RADIOLOGY REPORT*  Clinical Data: Status post fall.  Right hip pain.  RIGHT HIP - COMPLETE 2+ VIEW  Comparison: None.  Findings: Right hip arthroplasty device appears intact.  There is no evidence for fracture or subluxation.  Moderate to marked changes of osteoarthritis affects the left hip.  IMPRESSION:  1.  No evidence for hip fracture or dislocation. 2.  The left hip osteoarthritis.   Original Report Authenticated By: Signa Kell, M.D.    Dg Knee Complete 4 Views Right  03/02/2012  *RADIOLOGY REPORT*  Clinical Data: Fall.  Pain.  RIGHT KNEE - COMPLETE 4+ VIEW  Comparison: 02/08/2012  Findings: There is a moderate sized joint effusion.  There are degenerative changes most pronounced in the lateral compartment. No evidence of acute fracture.  IMPRESSION: Chronic degenerative changes.  Moderate sized effusion, larger since 3 weeks ago.  No evidence of acute fracture.   Original Report Authenticated By: Paulina Fusi, M.D.    Dg Hand Complete Right  03/02/2012  *RADIOLOGY REPORT*  Clinical Data: Larey Seat.  Pain.  RIGHT HAND - COMPLETE 3+ VIEW  Comparison: None.  Findings: No evidence of fracture or dislocation.  There are chronic degenerative changes at the first carpal metacarpal joint and at the wrist.  IMPRESSION: No acute or traumatic finding.  Chronic generative changes of the carpus and wrist.   Original Report Authenticated By: Paulina Fusi, M.D.      1. Fall   2. Knee effusion, right        MDM  Fall with chronic right knee effusion and known djd without new bony injury today.  Patients labs and/or radiological studies were reviewed during the medical decision making and disposition process. Pt was prescribed oxycodone.  To f/u with dr. Lajoyce Corners next Thursday for regularly scheduled appt.  He has knee immobilizer at home.    Apiration of blood/fluid Performed by: Burgess Amor Consent obtained. Required items: required blood products, implants, devices, and special equipment available Patient identity confirmed: verbally with patient Time out: Immediately prior to procedure a "time out" was called to  verify the correct patient, procedure, equipment, support staff and site/side marked as required. Preparation: Patient was prepped and draped in the usual sterile fashion, using betadine Patient tolerance: Patient tolerated the procedure well with no immediate complications.  Location of aspiration: right knee  Small amount,  Less than 2 cc blood obtained from knee joint space.  Large palpable effusion,  Suspect the effusion is to thick to pass through 18 gauge needle               Burgess Amor, [place] 03/02/12 1136

## 2012-03-02 NOTE — ED Notes (Signed)
Pt reports was sweeping this morning and R knee "gave out."  Pt reports fell and c/o pain in r knee, r hip, and r hand.  Reports his r knee gives out frequently and is supposed to have a knee replacement.  Pt had surgery in Oct on left foot for a wound that wouldn't heal.

## 2012-03-02 NOTE — ED Provider Notes (Signed)
Medical screening examination/treatment/procedure(s) were performed by non-physician practitioner and as supervising physician I was immediately available for consultation/collaboration.  Nathaniel Wakeley, MD 03/02/12 1430 

## 2012-03-10 ENCOUNTER — Other Ambulatory Visit (HOSPITAL_COMMUNITY): Payer: Self-pay | Admitting: Orthopedic Surgery

## 2012-03-24 ENCOUNTER — Emergency Department (HOSPITAL_COMMUNITY)
Admission: EM | Admit: 2012-03-24 | Discharge: 2012-03-25 | Disposition: A | Discharge status: Home / Self Care | Payer: Medicare Other | Attending: Emergency Medicine | Admitting: Emergency Medicine

## 2012-03-24 ENCOUNTER — Encounter (HOSPITAL_COMMUNITY): Payer: Self-pay | Admitting: Emergency Medicine

## 2012-03-24 DIAGNOSIS — F1410 Cocaine abuse, uncomplicated: Secondary | ICD-10-CM | POA: Insufficient documentation

## 2012-03-24 DIAGNOSIS — Z8701 Personal history of pneumonia (recurrent): Secondary | ICD-10-CM | POA: Insufficient documentation

## 2012-03-24 DIAGNOSIS — F17200 Nicotine dependence, unspecified, uncomplicated: Secondary | ICD-10-CM | POA: Insufficient documentation

## 2012-03-24 DIAGNOSIS — F1210 Cannabis abuse, uncomplicated: Secondary | ICD-10-CM | POA: Insufficient documentation

## 2012-03-24 DIAGNOSIS — E1169 Type 2 diabetes mellitus with other specified complication: Secondary | ICD-10-CM | POA: Insufficient documentation

## 2012-03-24 DIAGNOSIS — F411 Generalized anxiety disorder: Secondary | ICD-10-CM | POA: Insufficient documentation

## 2012-03-24 DIAGNOSIS — F1010 Alcohol abuse, uncomplicated: Secondary | ICD-10-CM | POA: Insufficient documentation

## 2012-03-24 DIAGNOSIS — M129 Arthropathy, unspecified: Secondary | ICD-10-CM | POA: Insufficient documentation

## 2012-03-24 DIAGNOSIS — Z87828 Personal history of other (healed) physical injury and trauma: Secondary | ICD-10-CM | POA: Insufficient documentation

## 2012-03-24 LAB — BASIC METABOLIC PANEL
BUN: 5 mg/dL — ABNORMAL LOW (ref 6–23)
Creatinine, Ser: 0.69 mg/dL (ref 0.50–1.35)
GFR calc Af Amer: >90 mL/min (ref >90)
GFR calc non Af Amer: >90 mL/min (ref >90)

## 2012-03-24 LAB — RAPID URINE DRUG SCREEN, HOSP PERFORMED
Barbiturates: NOT DETECTED
Benzodiazepines: NOT DETECTED

## 2012-03-24 LAB — URINE MICROSCOPIC-ADD ON

## 2012-03-24 LAB — CBC
HCT: 44.9 % (ref 39.0–52.0)
MCHC: 35.2 g/dL (ref 30.0–36.0)
MCV: 79.5 fL (ref 78.0–100.0)
RDW: 15.5 % (ref 11.5–15.5)

## 2012-03-24 LAB — URINALYSIS, ROUTINE W REFLEX MICROSCOPIC
Leukocytes, UA: NEGATIVE
Nitrite: NEGATIVE
pH: 5.5 (ref 5.0–8.0)

## 2012-03-24 MED ORDER — FOLIC ACID 1 MG PO TABS
1.0000 mg | ORAL_TABLET | Freq: Every day | ORAL | Status: DC
  Administered 2012-03-25: 1 mg via ORAL
  Filled 2012-03-24: qty 1

## 2012-03-24 MED ORDER — LORAZEPAM 2 MG/ML IJ SOLN: 2.0000 mg | Freq: Once | INTRAMUSCULAR | Status: AC

## 2012-03-24 MED ORDER — NICOTINE 21 MG/24HR TD PT24
MEDICATED_PATCH | TRANSDERMAL | Status: AC
  Filled 2012-03-24: qty 1

## 2012-03-24 MED ORDER — LORAZEPAM 2 MG/ML IJ SOLN
1.0000 mg | Freq: Once | INTRAMUSCULAR | Status: AC
  Administered 2012-03-24: 1 mg via INTRAVENOUS
  Filled 2012-03-24: qty 1

## 2012-03-24 MED ORDER — LORAZEPAM 2 MG/ML IJ SOLN
INTRAMUSCULAR | Status: AC
  Administered 2012-03-24: 2 mg via INTRAVENOUS
  Filled 2012-03-24: qty 1

## 2012-03-24 MED ORDER — ONDANSETRON HCL 4 MG/2ML IJ SOLN
4.0000 mg | Freq: Once | INTRAMUSCULAR | Status: AC
  Administered 2012-03-24: 4 mg via INTRAVENOUS
  Filled 2012-03-24: qty 2

## 2012-03-24 MED ORDER — LORAZEPAM 1 MG PO TABS
ORAL_TABLET | ORAL | Status: AC
  Administered 2012-03-24: 2 mg via ORAL
  Filled 2012-03-24: qty 2

## 2012-03-24 MED ORDER — ADULT MULTIVITAMIN W/MINERALS CH
1.0000 | ORAL_TABLET | Freq: Every day | ORAL | Status: DC
  Administered 2012-03-25: 1 via ORAL
  Filled 2012-03-24: qty 1

## 2012-03-24 MED ORDER — LORAZEPAM 1 MG PO TABS: 2.0000 mg | ORAL_TABLET | Freq: Once | ORAL | Status: AC

## 2012-03-24 MED ORDER — ONDANSETRON 8 MG PO TBDP: 8.0000 mg | ORAL_TABLET | Freq: Once | ORAL | Status: AC

## 2012-03-24 MED ORDER — NICOTINE 21 MG/24HR TD PT24
21.0000 mg | MEDICATED_PATCH | Freq: Once | TRANSDERMAL | Status: AC
  Administered 2012-03-24: 21 mg via TRANSDERMAL

## 2012-03-24 MED ORDER — ACETAMINOPHEN 325 MG PO TABS
650.0000 mg | ORAL_TABLET | ORAL | Status: DC | PRN
  Administered 2012-03-24 – 2012-03-25 (×2): 650 mg via ORAL
  Filled 2012-03-24 (×2): qty 2

## 2012-03-24 MED ORDER — ONDANSETRON 8 MG PO TBDP
ORAL_TABLET | ORAL | Status: AC
  Administered 2012-03-24: 8 mg via ORAL
  Filled 2012-03-24: qty 1

## 2012-03-24 MED ORDER — THIAMINE HCL 100 MG/ML IJ SOLN: 100.0000 mg | Freq: Every day | INTRAMUSCULAR | Status: DC

## 2012-03-24 MED ORDER — VITAMIN B-1 100 MG PO TABS
100.0000 mg | ORAL_TABLET | Freq: Every day | ORAL | Status: DC
  Administered 2012-03-25: 100 mg via ORAL
  Filled 2012-03-24: qty 1

## 2012-03-24 MED ORDER — LORAZEPAM 1 MG PO TABS
2.0000 mg | ORAL_TABLET | Freq: Once | ORAL | Status: AC
  Administered 2012-03-24: 2 mg via ORAL
  Filled 2012-03-24: qty 2

## 2012-03-24 NOTE — ED Notes (Signed)
Ambulatory with assistance to bathroom after patient states he needed to "do more than use the urinal"  Patient found to be smoking in the bathroom, cigarettes and lighter removed from patient, advised not to attempt this again.  Acknowledged that he would not.

## 2012-03-24 NOTE — ED Notes (Signed)
Patient states he feels calmer now.

## 2012-03-24 NOTE — ED Notes (Signed)
Very talkative, states he wants something to help him sleep, advised the alcohol he has consumed should make him sleepy - he responded it does not work like that on him.  Asked how high his ETOH has been in the past and he reports greater than 500+   States he has been in and out of rehab multiple times, just cant stop drinking.

## 2012-03-24 NOTE — ED Provider Notes (Addendum)
History     CSN: [id number]  Arrival date & time 03/24/12  0006   First MD Initiated Contact with Patient 03/24/12 0018      Chief Complaint  Patient presents with  . Alcohol Intoxication    (Consider location/radiation/quality/duration/timing/severity/associated sxs/prior treatment) HPI [name] is a 47 y.o. male who presents to the Emergency Department complaining of alcohol intoxication and wanting help with intoxication. Denies suicidal thoughts, homicidal thoughts, is not psychotic.  PCP Dr. Prince Rome  Past Medical History  Diagnosis Date  . Diabetic foot ulcer   . Pneumonia   . ETOH abuse   . Anxiety   . Arthritis   . Open wound     bottom of foot  . Diabetes mellitus without complication     Past Surgical History  Procedure Laterality Date  . Total hip arthroplasty    . Total knee arthroplasty    . Joint replacement    . Lung lobectomy    . Hip surgery    . Knee surgery    . Lung lobectomy    . Metatarsal osteotomy  10/29/2011    Procedure: METATARSAL OSTEOTOMY;  Surgeon: Nadara Mustard, MD;  Location: [facility];  Service: Orthopedics;  Laterality: Left;  Left 1st Metatarsal Dorsal Closing Wedge     No family history on file.  History  Substance Use Topics  . Smoking status: Current Every Day Smoker -- 1.50 packs/day for 30 years    Types: Cigarettes  . Smokeless tobacco: Not on file  . Alcohol Use: 0.0 oz/week     Comment: former, quit drinking approx 1 month ago.      Review of Systems  Constitutional: Negative for fever.       10 Systems reviewed and are negative for acute change except as noted in the HPI.  HENT: Negative for congestion.   Eyes: Negative for discharge and redness.  Respiratory: Negative for cough and shortness of breath.   Cardiovascular: Negative for chest pain.  Gastrointestinal: Negative for vomiting and abdominal pain.  Musculoskeletal: Negative for back pain.  Skin: Negative for rash.  Neurological: Negative for syncope,  numbness and headaches.  Psychiatric/Behavioral:       No behavior change.    Allergies  Benadryl and Trazodone and nefazodone  Home Medications   Current Outpatient Rx  Name  Route  Sig  Dispense  Refill  . hydrOXYzine (ATARAX/VISTARIL) 25 MG tablet   Oral   Take 25 mg by mouth every 6 (six) hours as needed for anxiety (or CIWA score </= 1).         . ibuprofen (ADVIL,MOTRIN) 200 MG tablet   Oral   Take 400 mg by mouth every 6 (six) hours as needed for pain.         . mirtazapine (REMERON) 30 MG tablet   Oral   Take 30 mg by mouth at bedtime as needed (Sleep).         Marland Kitchen oxyCODONE-acetaminophen (PERCOCET/ROXICET) 5-325 MG per tablet   Oral   Take 1 tablet by mouth every 4 (four) hours as needed for pain.   20 tablet   0     BP 145/83  Pulse 95  Temp(Src) 98.4 F (36.9 C) (Oral)  Resp 18  Ht 6\' 3"  (1.905 m)  Wt 180 lb (81.647 kg)  BMI 22.5 kg/m2  SpO2 97%  Physical Exam  Nursing note and vitals reviewed. Constitutional:  Awake, alert, intoxicated  HENT:  Head: Atraumatic.  Eyes: Right  eye exhibits no discharge. Left eye exhibits no discharge.  Neck: Neck supple.  Cardiovascular: Normal rate.   Pulmonary/Chest: Effort normal and breath sounds normal. He exhibits no tenderness.  Abdominal: Soft. Bowel sounds are normal. There is no tenderness. There is no rebound.  Musculoskeletal: He exhibits no tenderness.  Baseline ROM, no obvious new focal weakness.  Neurological:  Mental status and motor strength appears baseline for patient and situation.  Skin: No rash noted.  Psychiatric: He has a normal mood and affect.    ED Course  Procedures (including critical care time) Results for orders placed during the hospital encounter of 03/24/12  CBC      Result Value Range   WBC 8.8  4.0 - 10.5 K/uL   RBC 5.65  4.22 - 5.81 MIL/uL   Hemoglobin 15.8  13.0 - 17.0 g/dL   HCT 16.1  09.6 - 04.5 %   MCV 79.5  78.0 - 100.0 fL   MCH 28.0  26.0 - 34.0 pg   MCHC  35.2  30.0 - 36.0 g/dL   RDW 40.9  81.1 - 91.4 %   Platelets 153  150 - 400 K/uL  BASIC METABOLIC PANEL      Result Value Range   Sodium 140  135 - 145 mEq/L   Potassium 3.5  3.5 - 5.1 mEq/L   Chloride 97  96 - 112 mEq/L   CO2 22  19 - 32 mEq/L   Glucose, Bld 111 (*) 70 - 99 mg/dL   BUN 5 (*) 6 - 23 mg/dL   Creatinine, Ser 7.82  0.50 - 1.35 mg/dL   Calcium 8.9  8.4 - 95.6 mg/dL   GFR calc non Af Amer >90  >90 mL/min   GFR calc Af Amer >90  >90 mL/min  ETHANOL      Result Value Range   Alcohol, Ethyl (B) 416 (*) 0 - 11 mg/dL  URINE RAPID DRUG SCREEN (HOSP PERFORMED)      Result Value Range   Opiates NONE DETECTED  NONE DETECTED   Cocaine POSITIVE (*) NONE DETECTED   Benzodiazepines NONE DETECTED  NONE DETECTED   Amphetamines NONE DETECTED  NONE DETECTED   Tetrahydrocannabinol NONE DETECTED  NONE DETECTED   Barbiturates NONE DETECTED  NONE DETECTED  URINALYSIS, ROUTINE W REFLEX MICROSCOPIC      Result Value Range   Color, Urine STRAW (*) YELLOW   APPearance CLEAR  CLEAR   Specific Gravity, Urine <1.005 (*) 1.005 - 1.030   pH 5.5  5.0 - 8.0   Glucose, UA NEGATIVE  NEGATIVE mg/dL   Hgb urine dipstick TRACE (*) NEGATIVE   Bilirubin Urine NEGATIVE  NEGATIVE   Ketones, ur TRACE (*) NEGATIVE mg/dL   Protein, ur NEGATIVE  NEGATIVE mg/dL   Urobilinogen, UA 0.2  0.0 - 1.0 mg/dL   Nitrite NEGATIVE  NEGATIVE   Leukocytes, UA NEGATIVE  NEGATIVE  URINE MICROSCOPIC-ADD ON      Result Value Range   Squamous Epithelial / LPF RARE  RARE   RBC / HPF 0-2  <3 RBC/hpf   Bacteria, UA FEW (*) RARE  ETHANOL      Result Value Range   Alcohol, Ethyl (B) 270 (*) 0 - 11 mg/dL  ETHANOL      Result Value Range   Alcohol, Ethyl (B) <11  0 - 11 mg/dL       MDM  Patient intoxicated with ETOH 416. UDS positive for cocaine. Will allow him to sleep here tonight  and evaluate for detox in the AM.Repeat ETOH for 6 AM.ACT to see in the AM. ETOH was 270. ACT saw patient and patient was deciding if he  was going to do rehab outpatient or inpatient. Patient decided on inpatient. Repeat ETOH this morning is ,11. He is being treated as an alcohol withdrawal. Will need placement.   MDM Reviewed: nursing note and vitals Interpretation: labs             Nicoletta Dress. Colon Branch, MD 03/25/12 [phone]

## 2012-03-24 NOTE — ED Notes (Signed)
Patient is very anxious.  He cannot hold his cup to drink.  Blood pressure, pulse, and respirations have increased. Notified nurse.

## 2012-03-24 NOTE — ED Notes (Signed)
Dr Colon Branch notified of critical alcohol level of 416

## 2012-03-24 NOTE — BH Assessment (Signed)
Assessment Note   [name] is an 47 y.o. male. The patient came to the ED with complaints of alcohol intoxication. Labs reveled that his blood alcohol level was over 400. The patient was released from [facility] about 2 months ago and was sober until 1 week ago when he relapsed. He has been drinking a case of beer daily for the last week. He gives no reason for his relapse. He is neither suicidal nor homicidal. He is not psychotic. He complains of being sad, having lost interest in activity, and isolating.  He reports nausea, sweats, and shaking. He denies any history of seizures. His alcohol remains elevated, about 270 . Patient is unsure if he wants inpatient treatment or not. He wants to wait until he can think more clearly to decide.   Axis I: Alcohol Dependence;Substance Induced Mood Disorder Axis II: Deferred Axis III:  Past Medical History  Diagnosis Date  . Diabetic foot ulcer   . Pneumonia   . ETOH abuse   . Anxiety   . Arthritis   . Open wound     bottom of foot  . Diabetes mellitus without complication    Axis IV: other psychosocial or environmental problems, problems related to social environment, problems with access to health care services and problems with primary support group Axis V: 41-50 serious symptoms  Past Medical History:  Past Medical History  Diagnosis Date  . Diabetic foot ulcer   . Pneumonia   . ETOH abuse   . Anxiety   . Arthritis   . Open wound     bottom of foot  . Diabetes mellitus without complication     Past Surgical History  Procedure Laterality Date  . Total hip arthroplasty    . Total knee arthroplasty    . Joint replacement    . Lung lobectomy    . Hip surgery    . Knee surgery    . Lung lobectomy    . Metatarsal osteotomy  10/29/2011    Procedure: METATARSAL OSTEOTOMY;  Surgeon: Nadara Mustard, MD;  Location: [facility] OR;  Service: Orthopedics;  Laterality: Left;  Left 1st Metatarsal Dorsal Closing Wedge     Family History: No family  history on file.  Social History:  reports that he has been smoking Cigarettes.  He has a 45 pack-year smoking history. He does not have any smokeless tobacco history on file. He reports that  drinks alcohol. He reports that he uses illicit drugs (Marijuana and Cocaine).  Additional Social History:     CIWA: CIWA-Ar BP: 142/89 mmHg Pulse Rate: 87 Nausea and Vomiting: no nausea and no vomiting Tactile Disturbances: none Tremor: moderate, with patient's arms extended Auditory Disturbances: not present Paroxysmal Sweats: two Visual Disturbances: not present Anxiety: three Headache, Fullness in Head: none present Agitation: somewhat more than normal activity Orientation and Clouding of Sensorium: oriented and can do serial additions CIWA-Ar Total: 10 COWS:    Allergies:  Allergies  Allergen Reactions  . Benadryl (Diphenhydramine Hcl) Other (See Comments)    Pt states leg spasm  . Trazodone And Nefazodone Other (See Comments)    Leg Spasms.     Home Medications:  (Not in a hospital admission)  OB/GYN Status:  No LMP for male patient.  General Assessment Data Location of Assessment: [facility] ACT Assessment: Yes Living Arrangements: Alone Can pt return to current living arrangement?: Yes Admission Status: Voluntary Is patient capable of signing voluntary admission?: Yes Transfer from: Acute Hospital Referral Source:  MD  Education Status Is patient currently in school?: No  Risk to self Suicidal Ideation: No Suicidal Intent: No Is patient at risk for suicide?: No Suicidal Plan?: No Access to Means: No What has been your use of drugs/alcohol within the last 12 months?: daily alcohol for 1 week Previous Attempts/Gestures: No How many times?: 0 Other Self Harm Risks: continued alcohol abvuse Triggers for Past Attempts: None known Intentional Self Injurious Behavior: None Family Suicide History: No Recent stressful life event(s): Other (Comment)  (separation) Persecutory voices/beliefs?: No Depression: Yes Depression Symptoms: Insomnia;Isolating;Loss of interest in usual pleasures Substance abuse history and/or treatment for substance abuse?: Yes Suicide prevention information given to non-admitted patients: Yes  Risk to Others Homicidal Ideation: No Thoughts of Harm to Others: No Current Homicidal Intent: No Current Homicidal Plan: No Access to Homicidal Means: No History of harm to others?: No Assessment of Violence: None Noted Does patient have access to weapons?: No Criminal Charges Pending?: No Does patient have a court date: No  Psychosis Hallucinations: None noted Delusions: None noted  Mental Status Report Appear/Hygiene: Disheveled Eye Contact: Fair Motor Activity: Restlessness;Tremors;Agitation Speech: Logical/coherent;Slow;Soft Level of Consciousness: Alert;Restless Mood: Anxious;Depressed Affect: Anxious;Depressed Anxiety Level: Moderate Thought Processes: Coherent;Relevant Judgement: Unimpaired Orientation: Person;Place;Time;Situation Obsessive Compulsive Thoughts/Behaviors: None  Cognitive Functioning Concentration: Decreased Memory: Recent Intact;Remote Intact IQ: Average Insight: Fair Impulse Control: Poor Appetite: Fair Weight Loss: 0 Weight Gain: 0 Sleep: Decreased Total Hours of Sleep: 4 Vegetative Symptoms: None  ADLScreening [facility] Assessment Services) Patient's cognitive ability adequate to safely complete daily activities?: Yes Patient able to express need for assistance with ADLs?: Yes Independently performs ADLs?: Yes (appropriate for developmental age)  Abuse/Neglect [facility]) Physical Abuse: Denies Verbal Abuse: Denies Sexual Abuse: Denies  Prior Inpatient Therapy Prior Inpatient Therapy: Yes Prior Therapy Dates: 01/2012 Prior Therapy Facilty/Provider(s): [facility] Reason for Treatment: detox  Prior Outpatient Therapy Prior Outpatient Therapy: Yes Prior Therapy Dates:  2014 [facility]ilty[facility] Reason for Treatment: aftercare  ADL Screening (condition at time of admission) Patient's cognitive ability adequate to safely complete daily activities?: Yes Patient able to express need for assistance with ADLs?: Yes Independently performs ADLs?: Yes (appropriate for developmental age)       Abuse/Neglect Assessment (Assessment to be complete while patient is alone) Physical Abuse: Denies Verbal Abuse: Denies Sexual Abuse: Denies Values / Beliefs Cultural Requests During Hospitalization: None Spiritual Requests During Hospitalization: None        Additional Information 1:1 In Past 12 Months?: No CIRT Risk: No Elopement Risk: No Does patient have medical clearance?: No (alcohol level still elevated)     Disposition: Patient has not decided if he wants treatment or not. Not medically clear at this time. Will  Disposition Initial Assessment Completed: Yes Disposition of Patient: Other dispositions Other disposition(s): Other (Comment) (patient states he is unsure if he wants treatment or not) Patient referred to: Other (Comment) (waitin for patient to decide if he wants treatment)  On Site Evaluation by:   Reviewed with Physician:     Jearld Pies 03/24/2012 10:49 AM

## 2012-03-24 NOTE — ED Notes (Signed)
Restless, states he needs some medicine " My nerves are coming out".   VS stable.  MD informed

## 2012-03-24 NOTE — ED Notes (Signed)
Per EMS: Pt reports taking "20 cases of beer".

## 2012-03-24 NOTE — ED Notes (Signed)
[name], stating he feels "jittery".  Spoke w/Dr. Adriana Simas.  Order recv'd. For Ativan 2mg  PO.

## 2012-03-25 LAB — ETHANOL: Alcohol, Ethyl (B): <11 mg/dL (ref 0–11)

## 2012-03-25 MED ORDER — LORAZEPAM 1 MG PO TABS: 1.0000 mg | ORAL_TABLET | Freq: Three times a day (TID) | ORAL | Status: DC | PRN

## 2012-03-25 MED ORDER — LORAZEPAM 1 MG PO TABS
1.0000 mg | ORAL_TABLET | Freq: Four times a day (QID) | ORAL | Status: DC | PRN
  Administered 2012-03-25 (×3): 1 mg via ORAL
  Filled 2012-03-25 (×3): qty 1

## 2012-03-25 MED ORDER — LORAZEPAM 2 MG/ML IJ SOLN: 1.0000 mg | Freq: Four times a day (QID) | INTRAMUSCULAR | Status: DC | PRN

## 2012-03-25 MED ORDER — PROMETHAZINE HCL 25 MG PO TABS: 25.0000 mg | ORAL_TABLET | Freq: Four times a day (QID) | ORAL | Status: DC | PRN

## 2012-03-25 MED ORDER — NICOTINE 21 MG/24HR TD PT24
21.0000 mg | MEDICATED_PATCH | Freq: Every day | TRANSDERMAL | Status: DC
  Administered 2012-03-25: 21 mg via TRANSDERMAL
  Filled 2012-03-25: qty 1

## 2012-03-25 MED ORDER — ONDANSETRON HCL 4 MG PO TABS
4.0000 mg | ORAL_TABLET | Freq: Three times a day (TID) | ORAL | Status: DC | PRN
  Administered 2012-03-25: 4 mg via ORAL
  Filled 2012-03-25: qty 1

## 2012-03-25 NOTE — ED Notes (Signed)
Patient requesting Ativan; patient informed it was not time for additional Ativan.  Patient verbalized understanding.

## 2012-03-25 NOTE — ED Notes (Signed)
Pt is calm and cooperative. PT medicated according to [facility]WAA protocol. NAD
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
## 2012-03-25 NOTE — ED Notes (Signed)
Patient resting comfortably in bed with eyes closed.  Equal rise and fall of chest noted.

## 2012-03-25 NOTE — ED Provider Notes (Signed)
Condition stable. Rx Ativan 1 mg #20 and Phenergan 25 mg #20  Donnetta Hutching, MD 03/25/12 1535

## 2012-03-25 NOTE — ED Notes (Signed)
Patient awake and requesting Ativan.  Patient informed he could have it at 2am due to having to wait 6 hours in between doses.  Patient verbalized understanding.

## 2012-03-25 NOTE — ED Provider Notes (Signed)
1610 Patient was seen by ACT team. Patient could not make up his mind on whether he wanted to be inpatient or outpatient. Dr. Estell Harpin spoke with the patient and he made the decision to be in patient. ACT will need to come and place.  Nicoletta Dress. Colon Branch, MD 03/25/12 [phone]

## 2012-03-25 NOTE — ED Notes (Signed)
Pt in hallway with nad noted. Pt up for dc per Dr. Adriana Simas

## 2012-03-25 NOTE — ED Notes (Signed)
Resting medication for anxiety. See MAR.

## 2012-03-25 NOTE — ED Notes (Signed)
Patient resting with eyes closed.  Equal rise and fall of chest noted.  Will continue to monitor.

## 2012-03-27 ENCOUNTER — Encounter (HOSPITAL_COMMUNITY): Payer: Self-pay

## 2012-03-27 ENCOUNTER — Emergency Department (HOSPITAL_COMMUNITY)
Admission: EM | Admit: 2012-03-27 | Discharge: 2012-03-28 | Disposition: A | Discharge status: Other Acute Inpatient Hospital | Payer: Medicare Other | Source: Home / Self Care | Attending: Emergency Medicine | Admitting: Emergency Medicine

## 2012-03-27 DIAGNOSIS — F1020 Alcohol dependence, uncomplicated: Secondary | ICD-10-CM

## 2012-03-27 LAB — RAPID URINE DRUG SCREEN, HOSP PERFORMED
Barbiturates: NOT DETECTED
Cocaine: POSITIVE — AB
Opiates: NOT DETECTED

## 2012-03-27 LAB — URINALYSIS, ROUTINE W REFLEX MICROSCOPIC
Leukocytes, UA: NEGATIVE
Nitrite: NEGATIVE
Specific Gravity, Urine: 1.01 (ref 1.005–1.030)
Urobilinogen, UA: 0.2 mg/dL (ref 0.0–1.0)

## 2012-03-27 LAB — CBC
Platelets: 108 10*3/uL — ABNORMAL LOW (ref 150–400)
RBC: 4.91 MIL/uL (ref 4.22–5.81)
RDW: 16.2 % — ABNORMAL HIGH (ref 11.5–15.5)
WBC: 8.5 10*3/uL (ref 4.0–10.5)

## 2012-03-27 LAB — BASIC METABOLIC PANEL
Chloride: 98 mEq/L (ref 96–112)
GFR calc Af Amer: >90 mL/min (ref >90)
Potassium: 3.2 mEq/L — ABNORMAL LOW (ref 3.5–5.1)

## 2012-03-27 LAB — ETHANOL: Alcohol, Ethyl (B): 148 mg/dL — ABNORMAL HIGH (ref 0–11)

## 2012-03-27 NOTE — ED Notes (Signed)
Pt c/o right hip and leg pain yesterday. Pt states he slipped and fell on ice. Pt admits to alcohol use and states "I've had about 8 40oz beers today". Pt is also requesting alcohol detox. Pt states "I need to get sobered up so I can have surgery on my knee".

## 2012-03-27 NOTE — ED Notes (Signed)
Fell and injured back per pt. History of alcohol and drug use. Requesting detox

## 2012-03-27 NOTE — ED Provider Notes (Signed)
History    This chart was scribed for Sunnie Nielsen, MD by Sofie Rower, ED Scribe. The patient was seen in room [facility]08/[facility] and the patient's care was started at 10:05PM.    CSN: [id number]  Arrival date & time 03/27/12  2014   First MD Initiated Contact with Patient 03/27/12 2205      Chief Complaint  Patient presents with  . Fall  . Back Pain  . Medical Clearance    (Consider location/radiation/quality/duration/timing/severity/associated sxs/prior treatment) Patient is a 47 y.o. male presenting with fall. The history is provided by the patient. No language interpreter was used.  Fall The accident occurred 2 days ago. The fall occurred while walking. He fell from an unknown height. Impact surface: ice. There was no blood loss. The point of impact was the right hip. The pain is present in the right hip. The pain is moderate. He was ambulatory at the scene. There was no entrapment after the fall. There was no drug use involved in the accident. There was alcohol use involved in the accident. Pertinent negatives include no loss of consciousness. The symptoms are aggravated by activity and ambulation. He has tried nothing for the symptoms. The treatment provided no relief.    [name] is a 47 y.o. male , with a hx of diabetes, anxiety, ETOH abuse, hip and knee surgery, who presents to the Emergency Department complaining of sudden, moderate, fall, onset two days ago (03/25/12).  Associated symptoms include non radiating back pain located at the lumbar region and non radiating hip pain located at the right hip. The pt reports he was evaluated at [facility] on 03/25/12 with regards to detox from ETOH. Later that evening, the pt returned home, drank ETOH and fell, impacting upon an ice surface and injuring his right hip. The pt informs he has taken percocet in the past, which, has relieved the pain associated with his prior knee injuries and surgeries.  Modifying factors include certain movements and positions  of the right hip which intensifies the hip pain.  The pt is a current everyday smoker, in addition to drinking alcohol.   PCP is Dr. Prince Rome.    Past Medical History  Diagnosis Date  . Diabetic foot ulcer   . Pneumonia   . ETOH abuse   . Anxiety   . Arthritis   . Open wound     bottom of foot  . Diabetes mellitus without complication     Past Surgical History  Procedure Laterality Date  . Total hip arthroplasty    . Total knee arthroplasty    . Joint replacement    . Lung lobectomy    . Hip surgery    . Knee surgery    . Lung lobectomy    . Metatarsal osteotomy  10/29/2011    Procedure: METATARSAL OSTEOTOMY;  Surgeon: Nadara Mustard, MD;  Location: [facility] OR;  Service: Orthopedics;  Laterality: Left;  Left 1st Metatarsal Dorsal Closing Wedge     History reviewed. No pertinent family history.  History  Substance Use Topics  . Smoking status: Current Every Day Smoker -- 1.50 packs/day for 30 years    Types: Cigarettes  . Smokeless tobacco: Not on file  . Alcohol Use: 0.0 oz/week     Comment: former, quit drinking approx 1 month ago.      Review of Systems  Musculoskeletal: Positive for back pain and arthralgias.  Neurological: Negative for loss of consciousness.  All other systems reviewed and are negative.  Allergies  Benadryl and Trazodone and nefazodone  Home Medications   Current Outpatient Rx  Name  Route  Sig  Dispense  Refill  . ibuprofen (ADVIL,MOTRIN) 200 MG tablet   Oral   Take 400 mg by mouth every 6 (six) hours as needed for pain.         Marland Kitchen LORazepam (ATIVAN) 1 MG tablet   Oral   Take 1 tablet (1 mg total) by mouth 3 (three) times daily as needed for anxiety.   20 tablet   0   . promethazine (PHENERGAN) 25 MG tablet   Oral   Take 1 tablet (25 mg total) by mouth every 6 (six) hours as needed for nausea.   20 tablet   0     BP 122/75  Pulse 107  Temp(Src) 97.8 F (36.6 C) (Oral)  Ht 6\' 3"  (1.905 m)  Wt 180 lb (81.647 kg)  BMI  22.5 kg/m2  SpO2 93%  Physical Exam  Nursing note and vitals reviewed. Constitutional: He is oriented to person, place, and time. He appears well-developed and well-nourished. No distress.  HENT:  Head: Normocephalic and atraumatic.  Eyes: EOM are normal. Pupils are equal, round, and reactive to light.  Neck: Neck supple. No tracheal deviation present.  Cardiovascular: Normal rate.   Pulmonary/Chest: Effort normal. No respiratory distress.  Abdominal: Soft. He exhibits no distension.  Musculoskeletal: Normal range of motion. He exhibits tenderness. He exhibits no edema.       Right knee: He exhibits swelling.       Cervical back: He exhibits tenderness.       Lumbar back: He exhibits tenderness.  Chronic right knee swelling. Neurovascularly intact bilateral lower extremities. Tender over right bilateral right. Cervical spine tenderness. Lumbar spine tenderness.  Neurological: He is alert and oriented to person, place, and time. No sensory deficit.  Skin: Skin is warm and dry.  Psychiatric: He has a normal mood and affect. His behavior is normal.    ED Course  Procedures (including critical care time)  DIAGNOSTIC STUDIES: Oxygen Saturation is 93% on room air, normal by my interpretation.    COORDINATION OF CARE:  11:38 PM- Treatment plan discussed with patient. Pt agrees with treatment.   Results for orders placed during the hospital encounter of 03/27/12  CBC      Result Value Range   WBC 8.5  4.0 - 10.5 K/uL   RBC 4.91  4.22 - 5.81 MIL/uL   Hemoglobin 13.8  13.0 - 17.0 g/dL   HCT 96.0  45.4 - 09.8 %   MCV 82.9  78.0 - 100.0 fL   MCH 28.1  26.0 - 34.0 pg   MCHC 33.9  30.0 - 36.0 g/dL   RDW 11.9 (*) 14.7 - 82.9 %   Platelets 108 (*) 150 - 400 K/uL  BASIC METABOLIC PANEL      Result Value Range   Sodium 135  135 - 145 mEq/L   Potassium 3.2 (*) 3.5 - 5.1 mEq/L   Chloride 98  96 - 112 mEq/L   CO2 23  19 - 32 mEq/L   Glucose, Bld 115 (*) 70 - 99 mg/dL   BUN 6  6 - 23  mg/dL   Creatinine, Ser 5.62  0.50 - 1.35 mg/dL   Calcium 8.5  8.4 - 13.0 mg/dL   GFR calc non Af Amer >90  >90 mL/min   GFR calc Af Amer >90  >90 mL/min  ETHANOL      Result  Value Range   Alcohol, Ethyl (B) 148 (*) 0 - 11 mg/dL  URINALYSIS, ROUTINE W REFLEX MICROSCOPIC      Result Value Range   Color, Urine YELLOW  YELLOW   APPearance CLEAR  CLEAR   Specific Gravity, Urine 1.010  1.005 - 1.030   pH 6.0  5.0 - 8.0   Glucose, UA NEGATIVE  NEGATIVE mg/dL   Hgb urine dipstick NEGATIVE  NEGATIVE   Bilirubin Urine NEGATIVE  NEGATIVE   Ketones, ur NEGATIVE  NEGATIVE mg/dL   Protein, ur NEGATIVE  NEGATIVE mg/dL   Urobilinogen, UA 0.2  0.0 - 1.0 mg/dL   Nitrite NEGATIVE  NEGATIVE   Leukocytes, UA NEGATIVE  NEGATIVE  URINE RAPID DRUG SCREEN (HOSP PERFORMED)      Result Value Range   Opiates NONE DETECTED  NONE DETECTED   Cocaine POSITIVE (*) NONE DETECTED   Benzodiazepines NONE DETECTED  NONE DETECTED   Amphetamines NONE DETECTED  NONE DETECTED   Tetrahydrocannabinol NONE DETECTED  NONE DETECTED   Barbiturates NONE DETECTED  NONE DETECTED   Dg Hip Complete Right  03/02/2012  *RADIOLOGY REPORT*  Clinical Data: Status post fall.  Right hip pain.  RIGHT HIP - COMPLETE 2+ VIEW  Comparison: None.  Findings: Right hip arthroplasty device appears intact.  There is no evidence for fracture or subluxation.  Moderate to marked changes of osteoarthritis affects the left hip.  IMPRESSION:  1.  No evidence for hip fracture or dislocation. 2.  The left hip osteoarthritis.   Original Report Authenticated By: Signa Kell, M.D.    Dg Knee Complete 4 Views Right  03/02/2012  *RADIOLOGY REPORT*  Clinical Data: Fall.  Pain.  RIGHT KNEE - COMPLETE 4+ VIEW  Comparison: 02/08/2012  Findings: There is a moderate sized joint effusion.  There are degenerative changes most pronounced in the lateral compartment. No evidence of acute fracture.  IMPRESSION: Chronic degenerative changes.  Moderate sized effusion,  larger since 3 weeks ago.  No evidence of acute fracture.   Original Report Authenticated By: Paulina Fusi, M.D.    Dg Hand Complete Right  03/02/2012  *RADIOLOGY REPORT*  Clinical Data: Larey Seat.  Pain.  RIGHT HAND - COMPLETE 3+ VIEW  Comparison: None.  Findings: No evidence of fracture or dislocation.  There are chronic degenerative changes at the first carpal metacarpal joint and at the wrist.  IMPRESSION: No acute or traumatic finding.  Chronic generative changes of the carpus and wrist.   Original Report Authenticated By: Paulina Fusi, M.D.       MDM  Chronic hip pain / polysubstance abuse  Requesting alcohol detox and narcotic pain medications - holding narcotics.  Scheduled ativan prn any withdrawal symptoms  Labs/ UA/ UDS noted  Old records reviewed - has had recent imaging and work up of hip and knee pain  I personally performed the services described in this documentation, which was scribed in my presence. The recorded information has been reviewed and is accurate.     Sunnie Nielsen, MD 03/28/12 [phone]

## 2012-03-28 ENCOUNTER — Inpatient Hospital Stay (HOSPITAL_COMMUNITY)
Admission: AD | Admit: 2012-03-28 | Discharge: 2012-04-01 | DRG: 897 | Disposition: A | Discharge status: Home / Self Care | Payer: Medicare Other | Source: Ambulatory Visit | Attending: Psychiatry | Admitting: Psychiatry

## 2012-03-28 ENCOUNTER — Encounter (HOSPITAL_COMMUNITY): Payer: Self-pay | Admitting: *Deleted

## 2012-03-28 DIAGNOSIS — F410 Panic disorder [episodic paroxysmal anxiety] without agoraphobia: Secondary | ICD-10-CM | POA: Diagnosis present

## 2012-03-28 DIAGNOSIS — F1410 Cocaine abuse, uncomplicated: Secondary | ICD-10-CM | POA: Diagnosis present

## 2012-03-28 DIAGNOSIS — E119 Type 2 diabetes mellitus without complications: Secondary | ICD-10-CM | POA: Diagnosis present

## 2012-03-28 DIAGNOSIS — Z79899 Other long term (current) drug therapy: Secondary | ICD-10-CM

## 2012-03-28 DIAGNOSIS — F411 Generalized anxiety disorder: Secondary | ICD-10-CM | POA: Diagnosis present

## 2012-03-28 DIAGNOSIS — F1010 Alcohol abuse, uncomplicated: Principal | ICD-10-CM | POA: Diagnosis present

## 2012-03-28 MED ORDER — IBUPROFEN 200 MG PO TABS
400.0000 mg | ORAL_TABLET | Freq: Four times a day (QID) | ORAL | Status: DC | PRN
  Administered 2012-03-28 – 2012-03-29 (×3): 400 mg via ORAL
  Filled 2012-03-28 (×3): qty 2

## 2012-03-28 MED ORDER — POTASSIUM CHLORIDE CRYS ER 20 MEQ PO TBCR: 40.0000 meq | EXTENDED_RELEASE_TABLET | Freq: Three times a day (TID) | ORAL | Status: DC

## 2012-03-28 MED ORDER — POTASSIUM CHLORIDE CRYS ER 20 MEQ PO TBCR
20.0000 meq | EXTENDED_RELEASE_TABLET | Freq: Once | ORAL | Status: AC
  Administered 2012-03-28: 20 meq via ORAL
  Filled 2012-03-28: qty 1

## 2012-03-28 MED ORDER — CHLORDIAZEPOXIDE HCL 25 MG PO CAPS
25.0000 mg | ORAL_CAPSULE | Freq: Three times a day (TID) | ORAL | Status: AC
  Administered 2012-03-30 (×3): 25 mg via ORAL
  Filled 2012-03-28 (×4): qty 1

## 2012-03-28 MED ORDER — ACETAMINOPHEN 325 MG PO TABS: 650.0000 mg | ORAL_TABLET | Freq: Four times a day (QID) | ORAL | Status: DC | PRN

## 2012-03-28 MED ORDER — NICOTINE 21 MG/24HR TD PT24
21.0000 mg | MEDICATED_PATCH | Freq: Every day | TRANSDERMAL | Status: DC
  Administered 2012-03-29 – 2012-04-01 (×4): 21 mg via TRANSDERMAL
  Filled 2012-03-28 (×8): qty 1

## 2012-03-28 MED ORDER — LORAZEPAM 2 MG/ML IJ SOLN: 1.0000 mg | Freq: Four times a day (QID) | INTRAMUSCULAR | Status: DC | PRN

## 2012-03-28 MED ORDER — ALUM & MAG HYDROXIDE-SIMETH 200-200-20 MG/5ML PO SUSP: 30.0000 mL | ORAL | Status: DC | PRN

## 2012-03-28 MED ORDER — ONDANSETRON HCL 4 MG PO TABS
4.0000 mg | ORAL_TABLET | Freq: Three times a day (TID) | ORAL | Status: DC | PRN
  Administered 2012-03-28: 4 mg via ORAL
  Filled 2012-03-28: qty 1

## 2012-03-28 MED ORDER — LORAZEPAM 1 MG PO TABS: 1.0000 mg | ORAL_TABLET | Freq: Four times a day (QID) | ORAL | Status: DC | PRN

## 2012-03-28 MED ORDER — POTASSIUM CHLORIDE CRYS ER 20 MEQ PO TBCR: 40.0000 meq | EXTENDED_RELEASE_TABLET | Freq: Once | ORAL | Status: AC

## 2012-03-28 MED ORDER — CHLORDIAZEPOXIDE HCL 25 MG PO CAPS: 25.0000 mg | ORAL_CAPSULE | Freq: Four times a day (QID) | ORAL | Status: AC | PRN

## 2012-03-28 MED ORDER — CHLORDIAZEPOXIDE HCL 25 MG PO CAPS
25.0000 mg | ORAL_CAPSULE | Freq: Four times a day (QID) | ORAL | Status: AC
  Administered 2012-03-28 – 2012-03-29 (×6): 25 mg via ORAL
  Filled 2012-03-28 (×5): qty 1

## 2012-03-28 MED ORDER — LORAZEPAM 1 MG PO TABS
1.0000 mg | ORAL_TABLET | Freq: Three times a day (TID) | ORAL | Status: DC | PRN
  Administered 2012-03-28 (×2): 1 mg via ORAL
  Filled 2012-03-28 (×2): qty 1

## 2012-03-28 MED ORDER — ADULT MULTIVITAMIN W/MINERALS CH
1.0000 | ORAL_TABLET | Freq: Every day | ORAL | Status: DC
  Administered 2012-03-28: 1 via ORAL
  Filled 2012-03-28: qty 1

## 2012-03-28 MED ORDER — THIAMINE HCL 100 MG/ML IJ SOLN
100.0000 mg | Freq: Once | INTRAMUSCULAR | Status: AC
  Administered 2012-03-28: 100 mg via INTRAMUSCULAR

## 2012-03-28 MED ORDER — POTASSIUM CHLORIDE CRYS ER 20 MEQ PO TBCR
EXTENDED_RELEASE_TABLET | ORAL | Status: AC
  Administered 2012-03-28: 40 meq via ORAL
  Filled 2012-03-28: qty 2

## 2012-03-28 MED ORDER — HYDROXYZINE HCL 25 MG PO TABS
25.0000 mg | ORAL_TABLET | Freq: Four times a day (QID) | ORAL | Status: AC | PRN
  Administered 2012-03-29: 25 mg via ORAL

## 2012-03-28 MED ORDER — VITAMIN B-1 100 MG PO TABS
100.0000 mg | ORAL_TABLET | Freq: Every day | ORAL | Status: DC
  Administered 2012-03-29 – 2012-04-01 (×4): 100 mg via ORAL
  Filled 2012-03-28 (×6): qty 1

## 2012-03-28 MED ORDER — LOPERAMIDE HCL 2 MG PO CAPS: 2.0000 mg | ORAL_CAPSULE | ORAL | Status: AC | PRN

## 2012-03-28 MED ORDER — THIAMINE HCL 100 MG/ML IJ SOLN: 100.0000 mg | Freq: Every day | INTRAMUSCULAR | Status: DC

## 2012-03-28 MED ORDER — CHLORDIAZEPOXIDE HCL 25 MG PO CAPS
25.0000 mg | ORAL_CAPSULE | ORAL | Status: AC
  Administered 2012-03-31 (×2): 25 mg via ORAL
  Filled 2012-03-28 (×2): qty 1

## 2012-03-28 MED ORDER — MAGNESIUM HYDROXIDE 400 MG/5ML PO SUSP: 30.0000 mL | Freq: Every day | ORAL | Status: DC | PRN

## 2012-03-28 MED ORDER — CHLORDIAZEPOXIDE HCL 25 MG PO CAPS
25.0000 mg | ORAL_CAPSULE | Freq: Once | ORAL | Status: AC
  Administered 2012-03-28: 25 mg via ORAL
  Filled 2012-03-28: qty 1

## 2012-03-28 MED ORDER — FOLIC ACID 1 MG PO TABS
1.0000 mg | ORAL_TABLET | Freq: Every day | ORAL | Status: DC
  Administered 2012-03-28: 1 mg via ORAL
  Filled 2012-03-28: qty 1

## 2012-03-28 MED ORDER — VITAMIN B-1 100 MG PO TABS
100.0000 mg | ORAL_TABLET | Freq: Every day | ORAL | Status: DC
  Administered 2012-03-28: 100 mg via ORAL
  Filled 2012-03-28: qty 1

## 2012-03-28 MED ORDER — MIRTAZAPINE 30 MG PO TABS
30.0000 mg | ORAL_TABLET | Freq: Every day | ORAL | Status: DC
  Administered 2012-03-28 – 2012-03-31 (×4): 30 mg via ORAL
  Filled 2012-03-28 (×2): qty 4
  Filled 2012-03-28 (×6): qty 1

## 2012-03-28 MED ORDER — CHLORDIAZEPOXIDE HCL 25 MG PO CAPS
25.0000 mg | ORAL_CAPSULE | Freq: Every day | ORAL | Status: AC
  Administered 2012-04-01: 25 mg via ORAL
  Filled 2012-03-28: qty 1

## 2012-03-28 MED ORDER — ADULT MULTIVITAMIN W/MINERALS CH
1.0000 | ORAL_TABLET | Freq: Every day | ORAL | Status: DC
  Administered 2012-03-28 – 2012-04-01 (×5): 1 via ORAL
  Filled 2012-03-28 (×7): qty 1

## 2012-03-28 MED ORDER — NICOTINE 21 MG/24HR TD PT24
21.0000 mg | MEDICATED_PATCH | Freq: Every day | TRANSDERMAL | Status: DC
  Administered 2012-03-28 (×2): 21 mg via TRANSDERMAL
  Filled 2012-03-28 (×2): qty 1

## 2012-03-28 MED ORDER — ONDANSETRON 4 MG PO TBDP: 4.0000 mg | ORAL_TABLET | Freq: Four times a day (QID) | ORAL | Status: AC | PRN

## 2012-03-28 MED ORDER — IBUPROFEN 400 MG PO TABS
600.0000 mg | ORAL_TABLET | Freq: Three times a day (TID) | ORAL | Status: DC | PRN
  Administered 2012-03-28 (×2): 600 mg via ORAL
  Filled 2012-03-28 (×2): qty 2

## 2012-03-28 NOTE — Tx Team (Signed)
Initial Interdisciplinary Treatment Plan  PATIENT STRENGTHS: (choose at least two) Communication skills Motivation for treatment/growth Supportive family/friends  PATIENT STRESSORS: Health problems Marital or family conflict Substance abuse   PROBLEM LIST: Problem List/Patient Goals Date to be addressed Date deferred Reason deferred Estimated date of resolution  Suicidal ideation 03/28/2012   D/c        Substance abuse 03/28/2012   D/c        depression 03/28/2012   D/c                           DISCHARGE CRITERIA:  Ability to meet basic life and health needs Adequate post-discharge living arrangements Improved stabilization in mood, thinking, and/or behavior Medical problems require only outpatient monitoring Motivation to continue treatment in a less acute level of care Need for constant or close observation no longer present Reduction of life-threatening or endangering symptoms to within safe limits Safe-care adequate arrangements made Verbal commitment to aftercare and medication compliance Withdrawal symptoms are absent or subacute and managed without 24-hour nursing intervention  PRELIMINARY DISCHARGE PLAN: Attend aftercare/continuing care group Attend PHP/IOP Attend 12-step recovery group Outpatient therapy Return to previous living arrangement  PATIENT/FAMIILY INVOLVEMENT: This treatment plan has been presented to and reviewed with the patient, NICHOLA WARREN.  The patient and family have been given the opportunity to ask questions and make suggestions.  Earline Mayotte 03/28/2012, 4:36 PM

## 2012-03-28 NOTE — ED Provider Notes (Signed)
[name], ACT states accepted at [facility] by Dr Dub Mikes, needs recommendation for treating his hypokalemia.    Givens, MD 03/28/12 1354
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
## 2012-03-28 NOTE — ED Notes (Signed)
Pt requesting another ativan. CIWA done. Pt calm, no agitation noted. Very mild shakes noted. Pt c/o nausea and headache. EDP aware. vo to just continue the ativan q4 prn. Pt aware

## 2012-03-28 NOTE — BHH Counselor (Signed)
The patient has been accepted to [facility] as a voluntary admission. Molli Knock PA accepted to the service of Dr I.  Dub Mikes. He is assigned to room 300-01. He will be transported by [facility]. Support paper work completed and distributed. Dr Lars Mage informed of pending transfer.

## 2012-03-28 NOTE — ED Notes (Signed)
Explained to patient that he would not be receiving any pain medication or medication to help him sleep as long as he wanted to be held for detox. This was per Dr. Dierdre Highman.

## 2012-03-28 NOTE — ED Notes (Signed)
Explained to patient that he would not be able to have any Ativan until 8 am and ibuprofen would not be available until 9 am. Patient verbalized understanding.

## 2012-03-28 NOTE — ED Provider Notes (Signed)
PT came to get ETOH detox. Pt states he is a little shaky this morning, but he has eaten breakfast and is getting ready to brush his teeth. Is waiting to have ACT evaluation this morning. C/O pain in his right thigh from a fall.   Ward Givens, MD 03/28/12 [phone]

## 2012-03-28 NOTE — BH Assessment (Signed)
[facility] Assessment Progress Note      Consulted Timmie Foerster. NP for admission for this client. He will be accepted when his potassium is 3.4 or higher.

## 2012-03-28 NOTE — Progress Notes (Signed)
AA members did not arrive for group so patients watched an AA video. Pt attended and was attentive throughout group.
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
## 2012-03-28 NOTE — BH Assessment (Signed)
[facility] Assessment Progress Note      Client has been given 40 meg of Potassium at [facility] and consulted with Minerva Areola, [facility] if that is enough to bring him here for admission because previously he had been at 3.2 with his K+. It is and [place] M. notified and he will be admitted to bed 300-1.

## 2012-03-28 NOTE — ED Notes (Signed)
Pt requesting ibuprofen for pain and ativan for anxiety.  meds given.

## 2012-03-29 ENCOUNTER — Encounter (HOSPITAL_COMMUNITY): Payer: Self-pay | Admitting: Psychiatry

## 2012-03-29 DIAGNOSIS — F10939 Alcohol use, unspecified with withdrawal, unspecified: Secondary | ICD-10-CM

## 2012-03-29 DIAGNOSIS — F1020 Alcohol dependence, uncomplicated: Secondary | ICD-10-CM

## 2012-03-29 DIAGNOSIS — F411 Generalized anxiety disorder: Secondary | ICD-10-CM

## 2012-03-29 DIAGNOSIS — F410 Panic disorder [episodic paroxysmal anxiety] without agoraphobia: Secondary | ICD-10-CM

## 2012-03-29 MED ORDER — QUETIAPINE FUMARATE 50 MG PO TABS
50.0000 mg | ORAL_TABLET | Freq: Every day | ORAL | Status: DC
  Administered 2012-03-29 – 2012-03-31 (×3): 50 mg via ORAL
  Filled 2012-03-29 (×2): qty 1
  Filled 2012-03-29: qty 4
  Filled 2012-03-29 (×2): qty 1
  Filled 2012-03-29: qty 4

## 2012-03-29 MED ORDER — GABAPENTIN 300 MG PO CAPS
300.0000 mg | ORAL_CAPSULE | Freq: Three times a day (TID) | ORAL | Status: DC
  Administered 2012-03-29 – 2012-04-01 (×10): 300 mg via ORAL
  Filled 2012-03-29: qty 1
  Filled 2012-03-29: qty 12
  Filled 2012-03-29 (×3): qty 1
  Filled 2012-03-29: qty 12
  Filled 2012-03-29 (×5): qty 1
  Filled 2012-03-29 (×3): qty 12
  Filled 2012-03-29 (×3): qty 1
  Filled 2012-03-29: qty 12

## 2012-03-29 MED ORDER — ESCITALOPRAM OXALATE 10 MG PO TABS
10.0000 mg | ORAL_TABLET | Freq: Every day | ORAL | Status: DC
  Administered 2012-03-29 – 2012-04-01 (×4): 10 mg via ORAL
  Filled 2012-03-29: qty 4
  Filled 2012-03-29 (×4): qty 1
  Filled 2012-03-29: qty 4
  Filled 2012-03-29: qty 1

## 2012-03-29 MED ORDER — IBUPROFEN 800 MG PO TABS
800.0000 mg | ORAL_TABLET | Freq: Four times a day (QID) | ORAL | Status: DC | PRN
  Administered 2012-03-29 – 2012-03-31 (×8): 800 mg via ORAL
  Filled 2012-03-29 (×8): qty 1

## 2012-03-29 MED ORDER — POTASSIUM CHLORIDE CRYS ER 20 MEQ PO TBCR
20.0000 meq | EXTENDED_RELEASE_TABLET | Freq: Two times a day (BID) | ORAL | Status: AC
  Administered 2012-03-29 – 2012-03-31 (×6): 20 meq via ORAL
  Filled 2012-03-29 (×6): qty 1
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
## 2012-03-29 NOTE — Progress Notes (Signed)
Pt attended AA group; participated by sharing; attentive and supportive of peers.

## 2012-03-29 NOTE — Progress Notes (Signed)
Patient ID: [name], male   DOB: 1965-04-22, 47 y.o.   MRN: [id number]  D:  Pt was pleasant and cooperative during the assessment process.  Pt was focused on meds and had to be redirected several times during the conversation. Pt voiced no other complaints or concerns.   A:  Support and encouragement was offered. 15 min checks continued for safety.  R: Pt remains safe.
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
## 2012-03-29 NOTE — Progress Notes (Signed)
Recreation Therapy Notes  Date: 03.11.2014 Time: 3:00pm Location: [facility]      Group Topic/Focus: Goal Setting  Participation Level: Did not attend   [employer], LRT/CTRS  Jearl Klinefelter 03/29/2012 4:40 PM

## 2012-03-29 NOTE — Progress Notes (Signed)
[facility] LCSW Aftercare Discharge Planning Group Note  03/29/2012 12:42 PM  Participation Quality:  Appropriate  Affect:  Appropriate  Cognitive:  Alert and Oriented  Insight:  Limited  Engagement in Group:  Limited  Modes of Intervention:  Discussion, Exploration, Rapport Building and Support  Summary of Progress/Problems: Pt denies both suicidal and homicidal ideation.  On a scale of 1 to 10 with ten being the most ever experienced, the patient rates depression at a 4 and anxiety at a 10. Patient reports he is scheduled for knee replacement surgery in early April. Patient committed to also attending AA meetings in area.   Clide Dales 03/29/2012, 12:42 PM

## 2012-03-29 NOTE — [facility] Suicide Risk Assessment (Signed)
Suicide Risk Assessment  Admission Assessment     Nursing information obtained from:  Patient Demographic factors:  Male;Divorced or widowed;Caucasian;Low socioeconomic status;Living alone;Unemployed Current Mental Status:    Loss Factors:  Loss of significant relationship;Decline in physical health;Financial problems / change in socioeconomic status Historical Factors:  Family history of mental illness or substance abuse Risk Reduction Factors:  Sense of responsibility to family  CLINICAL FACTORS:   Alcohol/Substance Abuse/Dependencies  COGNITIVE FEATURES THAT CONTRIBUTE TO RISK:  Closed-mindedness Thought constriction (tunnel vision)    SUICIDE RISK:   Mild:  Suicidal ideation of limited frequency, intensity, duration, and specificity.  There are no identifiable plans, no associated intent, mild dysphoria and related symptoms, good self-control (both objective and subjective assessment), few other risk factors, and identifiable protective factors, including available and accessible social support.  PLAN OF CARE: Supportive approach/coping skills.relapse prevention                               Detox with Librium, reassess co morbidites  I certify that inpatient services furnished can reasonably be expected to improve the patient's condition.  LUGO,IRVING A 03/29/2012, 8:56 AM

## 2012-03-29 NOTE — Progress Notes (Signed)
Pt has been up for most groups today.  He rated his depression a 5 hopelessness a 1 and his anxiety a 10 on his self-inventory.  He denies any S/H ideation or A.\/V hallucinations.  He plans to f/u at [facility] in [place].  He claims he has tremors but not noticed or even felt by this nurse.  He did ask for vistaril at 1018 for anxiety and ibuprofen at 1508 for his pain which he reported some relief with both.
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
## 2012-03-30 DIAGNOSIS — F1410 Cocaine abuse, uncomplicated: Secondary | ICD-10-CM

## 2012-03-30 NOTE — Tx Team (Signed)
Interdisciplinary Treatment Plan Update (Adult)  Date: 03/30/2012  Time Reviewed: 9:51 AM  Progress in Treatment:  Attending groups: Yes  Participating in groups: Yes  Taking medication as prescribed: Yes  Tolerating medication: Yes  Family/Significant othe contact made: No, no consent  Patient understands diagnosis: Yes  Discussing patient identified problems/goals with staff: Yes  Medical problems stabilized or resolved: Yes Denies suicidal/homicidal ideation: Yes  Patient has not harmed self or Others: Yes  New problem(s) identified: None Identified  Discharge Plan or Barriers: Pt to be referred to [facility]. Additional comments: N/A Reason for Continuation of Hospitalization:  Anxiety  Depression  Medication stabilization   Estimated length of stay: 2-3 days  For review of initial/current patient goals, please see plan of care.  Attendees:  Patient:    Family:    Physician: Geoffery Lyons  03/30/2012 4:33 PM   Nursing:     03/30/2012 4:33 PM   Other: Foye Clock, MSW Intern  03/30/2012 4:33 PM   Other: Robbie Louis, RN  03/30/2012 4:33 PM   Other: Concha Se, [employer] PA Student  03/30/2012 4:33 PM   Other: Leland Johns Psych Intern  03/30/2012 4:33 PM   Scribe for Treatment Team:  Rico Ala 03/30/2012 4:36 PM

## 2012-03-30 NOTE — Progress Notes (Signed)
D: Patient appropriate and cooperative with staff. Patient's affect/mood is flat and anxious. Patient complained of right knee pain 8/10. He reported on the self inventory sheet that his appetite is good, energy level is normal and ability to pay attention is good. Patient rated depression and feelings of hopelessness "1". He reported that changes he plans to make to better care for self are to stay in church and go to [employer]  A: Support and encouragement provided to patient. Scheduled medications administered per MD orders. Maintain Q15 minute checks for safety.  R: Patient receptive. Denies SI/HI/AVH. Patient remains safe.

## 2012-03-30 NOTE — Progress Notes (Signed)
Adult Psychoeducational Group Note  Date:  03/30/2012 Time:  11:00am  Group Topic/Focus:  Personal Choices and Values:   The focus of this group is to help patients assess and explore the importance of values in their lives, how their values affect their decisions, how they express their values and what opposes their expression.  Participation Level:  Active  Participation Quality:  Appropriate, Sharing and Supportive  Affect:  Appropriate  Cognitive:  Appropriate  Insight: Appropriate  Engagement in Group:  Engaged and Supportive  Modes of Intervention:  Education and Support  Additional Comments:  Pt was involved, was appropriate.  [name] M 03/30/2012, 12:57 PM
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
## 2012-03-30 NOTE — Progress Notes (Signed)
Adult Psychoeducational Group Note  Date:  03/30/2012 Time:  8:34 PM  Group Topic/Focus:  AA group  Participation Level:  Active  Participation Quality:  Appropriate  Affect:  Appropriate  Cognitive:  Alert  Insight: Appropriate  Engagement in Group:  Engaged  Modes of Intervention:  Discussion  Additional Comments:    Flonnie Hailstone 03/30/2012, 8:34 PM

## 2012-03-30 NOTE — Progress Notes (Deleted)
Patient ID: [name], male   DOB: 1965/05/02, 47 y.o.   MRN: [id number]

## 2012-03-30 NOTE — Progress Notes (Signed)
[facility] LCSW Aftercare Discharge Planning Group Note  03/30/2012 9:23 AM  Participation Quality:  Appropriate and Attentive  Affect:  Appropriate  Cognitive:  Alert and Appropriate  Insight:  Developing/Improving  Engagement in Group:  Engaged  Modes of Intervention:  Discussion, Education and Rapport Building  Summary of Progress/Problems: Pt attended d/c planning group.  His affect was appropriate.  He maintained eye contact and open posture with speaker and was open in his disclosures. Pt rates depression at zero and anxiety at five.  He reports that he has no dt symptoms. He shared that he plans to return to [facility] following d/c and is to be picked up by his parents.  Pt stated that he has follow up at [facility].  [name] L 03/30/2012, 9:23 AM

## 2012-03-30 NOTE — Progress Notes (Signed)
[facility] LCSW Group Therapy Emotion Regulation 03/30/2012 2:26 PM  Type of Therapy:  Group Therapy  Participation Level:  Active  Participation Quality:  Appropriate and Attentive  Affect:  Appropriate  Cognitive:  Alert and Appropriate  Insight:  Developing/Improving  Engagement in Therapy:  Engaged  Modes of Intervention:  Discussion, Education, Rapport Building and Support  Summary of Progress/Problems:Todays group topic was Emotion Regulation.  Pts participated in activity where they chose two photographs provided by SW Intern, one that represented a positive emotion and one that represented a negative emotion. Pts processed ways to cope with negative emotions and behaviors that they could engage in to evoke their positive emotions.  Pt attended group and was attentive and open.  Pt chose a picture of a city which he identified as [place].  He shared that he had been there and had positive memories of that time.  He also shared that he liked the perspective of the photo because the focus was sharp.  He disclosed that he would like to apply the concept of focus to his life and get his life on track.  Pts negative photo was a picture of money.  He was able to process that by getting more focused in his life he would be able to attain more financial stability.   Bournes, Roshelle L 03/30/2012, 2:26 PM

## 2012-03-31 NOTE — Progress Notes (Signed)
[facility] LCSW Group Note Living a Balanced Life  03/31/2012 2:28 PM  Participation Quality:  Appropriate and Attentive  Affect:  Appropriate  Cognitive:  Alert and Appropriate  Insight:  Developing/Improving  Engagement in Group:  Engaged  Modes of Intervention:  Discussion, Education, Problem-solving, Rapport Building and Support  Summary of Progress/Problems: Pt attended psychoeducation group.  Today's theme was "Living a Balanced Life."  Pts processed what balance means to them, what ways help up find balance, and what things make [place] lose balance.  Pts also took turns discussing things that they would like to let go of and things they would like to hold on to in order to maintain balance in their lives.  Pt shared that he would like to hold on to his family and let go of his addiction.  He processed that some ways that he could let go of his addiction were to attend AA and cultivate healthy supportive relationships there.  He also shared that he plans to attend church more regularly and to use the resources at his disposal like his sober friends and family instead of making excuses.  Bournes, Roshelle L 03/31/2012, 2:28 PM

## 2012-03-31 NOTE — Progress Notes (Signed)
Adult Psychoeducational Group Note  Date:  03/31/2012 Time:  6:50 PM  Group Topic/Focus:  Overcoming Stress:   The focus of this group is to define stress and help patients assess their triggers.  Participation Level:  Active  Participation Quality:  Appropriate, Attentive, Sharing and Supportive  Affect:  Appropriate  Cognitive:  Appropriate  Insight: Good  Engagement in Group:  Engaged  Modes of Intervention:  Education and Support  Additional Comments:  Pt actively participated in group discussion on stress, including triggers for stress, physiological signs that one is stressed, and positive coping skills for dealing with stress. Pt shared numerous coping skills he uses in order to cope with feelings of stress.   Reinaldo Raddle K 03/31/2012, 6:50 PM

## 2012-03-31 NOTE — Progress Notes (Signed)
Patient resting quietly with eyes closed. Respirations even and unlabored. No distress noted, Q 15 minute check continues as ordered to maintain safety.  

## 2012-03-31 NOTE — Progress Notes (Signed)
Patient ID: [name], male   DOB: 02-Feb-1965, 47 y.o.   MRN: [id number]  D: Pt was pleasant, cooperative, and less anxious than previous days. Pt was allowed to shave during evening shift. Pt voiced no complaints or concerns.   A:  Support and encouragement was offered. 15 min checks continued for safety.  R: Pt remains safe.

## 2012-03-31 NOTE — Care Management Utilization Note (Signed)
PER STATE REGULATIONS 482.30  THIS CHART WAS REVIEWED FOR MEDICAL NECESSITY WITH RESPECT TO THE PATIENT'S ADMISSION/ DURATION OF STAY.  NEXT REVIEW DATE: 04/04/12  

## 2012-03-31 NOTE — Progress Notes (Signed)
Recreation Therapy Notes  Date: 03.13.2014  Time: 3:00pm  Location: [address] Day Room   Group Topic/Focus: Leisure Education   Participation Level:  Active   Participation Quality:  Appropriate   Affect:  Euthymic  Cognitive:  Appropriate   Additional Comments: Patient viewed informational video on QiGong. Patient did not chose to participate in instructional portion of video shown. Patient participated in discussion about using recreation and leisure as a coping mechanism. 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 Patient stated he became disabled approximately 3 years ago and has not been abel to work as an Personnel officer since. Patient stated he did rewire his church and from time to time he will go to that church to check on the wiring and make sure everything is still running ok. LRT encouraged patient to find community projects to work on that will allow him to use his trade post D/C.  Marykay Lex Blanchfield, LRT/CTRS  Jearl Klinefelter 03/31/2012 4:35 PM

## 2012-03-31 NOTE — Progress Notes (Signed)
[facility] LCSW Aftercare Discharge Planning Group Note  03/31/2012 11:38 AM  Participation Quality:  Appropriate and Attentive  Affect:  Anxious and Appropriate  Cognitive:  Alert and Appropriate  Insight:  Developing/Improving  Engagement in Group:  Engaged  Modes of Intervention:  Discussion, Education, Problem-solving and Support  Summary of Progress/Problems: Pt attended dc planning group. He rates depression at one, anxiety at five, and denies SI/HI. Pt reports that he feels ready to go home and begin his recovery. Pt states that he plans to focus on avoiding relationships with those that use and to begin to create new healthy and supportive friendships by attending AA meetings. Pts family will transport home following dc.  Bournes, Roshelle L 03/31/2012, 11:38 AM
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
## 2012-03-31 NOTE — Progress Notes (Signed)
D   Pt is pleasant on approach   He attends and participates in group and interacts well with others   His only complaints at present are knee pain and a little shakiness from withdrawal   A   Verbal support given  Medications administered and effectiveness monitored   Q 15 min checks R   Pt safe at present

## 2012-03-31 NOTE — Progress Notes (Signed)
D:  Patient's self inventory sheet, patient has fair sleep, good appetite, normal energy level, good attention span.  Rate depression and hopelessness #1.  Has felt agitation in past 24 hours.  Denied SI.   Has pain, pain goal #7, worst pain #9.  After discharge, plans to attend AA, go to church, outpatient therapy.  Does have discharge plans.  No problems taking meds after discharge. A:  Medications administered per MD order.  Support and encouragement given throughout day.   R:  Following treatment plan.  Denied SI and HI.  Denied A/V hallucinations.  Contracts for safety. Has had been using cane during and after his admission to [facility].  Stated he did not feel he needs his cane this morning.  Nurse encouraged patient to use cane and to inform nurse if assistance needed.  Patient informed nurse that he is feeling better this morning.

## 2012-03-31 NOTE — Progress Notes (Signed)
[facility] Group Notes:  (Nursing/MHT/Case Management/Adjunct)  Date:  03/31/2012   Time:  2100   Type of Therapy:  wrap up group  Participation Level:  Active  Participation Quality:  Appropriate, Attentive and limited sharing  Affect:  Appropriate  Cognitive:  Alert  Insight:  Limited  Engagement in Group:  Limited  Modes of Intervention:  Clarification, Education and Support  Summary of Progress/Problems: Pt was attentive and supportive but gave very short nondescript answers to this writers questions.  Pt replied that he was happy to go home tomorrow, grateful to be alive, and wants to stay sober.    Shelah Lewandowsky 03/31/2012, 11:51 PM

## 2012-04-01 DIAGNOSIS — F1010 Alcohol abuse, uncomplicated: Principal | ICD-10-CM

## 2012-04-01 MED ORDER — QUETIAPINE FUMARATE 50 MG PO TABS: 50.0000 mg | ORAL_TABLET | Freq: Every day | ORAL | Status: DC

## 2012-04-01 MED ORDER — MIRTAZAPINE 30 MG PO TABS: 30.0000 mg | ORAL_TABLET | Freq: Every day | ORAL | Status: DC

## 2012-04-01 MED ORDER — GABAPENTIN 300 MG PO CAPS: 300.0000 mg | ORAL_CAPSULE | Freq: Three times a day (TID) | ORAL | Status: DC

## 2012-04-01 MED ORDER — ESCITALOPRAM OXALATE 10 MG PO TABS: 10.0000 mg | ORAL_TABLET | Freq: Every day | ORAL | Status: DC

## 2012-04-01 MED ORDER — IBUPROFEN 200 MG PO TABS: 400.0000 mg | ORAL_TABLET | Freq: Four times a day (QID) | ORAL | Status: DC | PRN

## 2012-04-01 NOTE — Progress Notes (Signed)
[facility] Group Notes:  (Nursing/MHT/Case Management/Adjunct)  Date:  04/01/2012  Time:  11:11 AM  Type of Therapy:  Psychoeducational Skills  Participation Level:  Active  Participation Quality:  Appropriate, Attentive and Sharing  Affect:  Appropriate and Excited  Cognitive:  Alert and Appropriate  Insight:  Appropriate and Good  Engagement in Group:  Engaged  Modes of Intervention:  Activity and Socialization  Summary of Progress/Problems: Pt was engaged in Therapeutic Activity where pts participated in a game called "Would you rather". Pt appeared to understand the concept of the group, stating it was meant for distraction and an example of a coping skill that pts can use outside of [facility].   Dalia Heading 04/01/2012, 11:11 AM

## 2012-04-01 NOTE — Progress Notes (Signed)
[facility] Adult Case Management Discharge Plan :  Will you be returning to the same living situation after discharge: Yes,  home, yet girlfriend has left and family has cleaned home At discharge, do you have transportation home?:Yes,  family Do you have the ability to pay for your medications:Yes,  through [facility]  Release of information consent forms completed and in the chart;  Patient's signature needed at discharge.  Patient to Follow up at: Follow-up Information   [facility] (Patient can walk in for followup appointment betwee 8:30am and 11 am Monday - Friday.)    Contact information:   [address]  [address] [place] [address]   [facility] [phone] FAX [phone]      Patient denies SI/HI:   Yes,  denies both    Safety Planning and Suicide Prevention discussed:  Yes,  in group discussions yet specific suicide prevention education was not applicable for [place].  Clide Dales 04/01/2012, 11:21 AM

## 2012-04-01 NOTE — Progress Notes (Signed)
[facility] LCSW Aftercare Discharge Planning Group Note  04/01/2012 8:45 AM  Participation Quality:  Appropriate  Affect:  Appropriate  Cognitive:  Alert and Oriented  Insight:  Developing/Improving  Engagement in Group:  Improving  Modes of Intervention:  Clarification, Exploration and Support  Summary of Progress/Problems:  Pt denies both suicidal and homicidal ideation.  On a scale of 1 to 10 with ten being the most ever experienced, the patient rates depression at a 2 and anxiety at a 0. Eero reports he will be going to [employer], [employer] and [employer] on Wednesdays for support groups.    Clide Dales 04/01/2012, 11:17 AM

## 2012-04-01 NOTE — [facility] Suicide Risk Assessment (Signed)
Suicide Risk Assessment  Discharge Assessment     Demographic Factors:  Male and Caucasian  Mental Status Per Nursing Assessment::   On Admission:     Current Mental Status by Physician: In full contact with reality. There are no suicidal ideas,plans or intent. His mood is euthymic, his affect is appropriate. He is going to have surgery knee replacement April the 2nd. Feels he is going to be able to do better afterwards as he is going to be busy, involved   Loss Factors: Decline in physical health  Historical Factors: NA  Risk Reduction Factors:   Sense of responsibility to family and Positive social support  Continued Clinical Symptoms:  Severe Anxiety and/or Agitation Alcohol/Substance Abuse/Dependencies  Cognitive Features That Contribute To Risk: None identified     Suicide Risk:  Minimal: No identifiable suicidal ideation.  Patients presenting with no risk factors but with morbid ruminations; may be classified as minimal risk based on the severity of the depressive symptoms  Discharge Diagnoses:   AXIS I:  Alcohol Dependence, S/P withdrawal, GAD, panic attacks AXIS II:  Deferred AXIS III:   Past Medical History  Diagnosis Date  . Diabetic foot ulcer   . Pneumonia   . ETOH abuse   . Anxiety   . Arthritis   . Open wound     bottom of foot  . Diabetes mellitus without complication    AXIS IV:  other psychosocial or environmental problems AXIS V:  61-70 mild symptoms  Plan Of Care/Follow-up recommendations:  Activity:  as tolerated Diet:  regular Follow up outpatient basis Is patient on multiple antipsychotic therapies at discharge:  No   Has Patient had three or more failed trials of antipsychotic monotherapy by history:  No  Recommended Plan for Multiple Antipsychotic Therapies: N/A   LUGO,IRVING A 04/01/2012, 11:48 AM

## 2012-04-01 NOTE — Discharge Summary (Signed)
Physician Discharge Summary Note  Patient:  [name] is an 47 y.o., male MRN:  [id number] DOB:  Dec 08, 1965 Patient phone:  [phone] (home)  Patient address:   [address] [place] [address],   Date of Admission:  03/28/2012  Date of Discharge:  04/01/12  Reason for Admission: Alcohol intoxication  Discharge Diagnoses: Active Problems:   Cocaine abuse   Generalized anxiety disorder   Panic attacks  Review of Systems  Constitutional: Negative.   HENT: Negative.   Eyes: Negative.   Respiratory: Positive for hemoptysis.   Cardiovascular: Negative.   Gastrointestinal: Negative.   Genitourinary: Negative.   Musculoskeletal: Positive for joint pain.  Skin: Negative.   Neurological: Negative.   Endo/Heme/Allergies: Negative.   Psychiatric/Behavioral: Positive for depression (Stabilized with medication prior to discharge) and substance abuse (Hx alcoholism, Cocaine abuse). Negative for suicidal ideas, hallucinations and memory loss. The patient is nervous/anxious (Stabilized with medication prior to discharge) and has insomnia (Stabilized with medication prior to discharge).    Axis Diagnosis:   AXIS I:  Cocaine abuse, Generalized anxiety disorder, Alcohol abuse continuous AXIS II:  Deferred AXIS III:   Past Medical History  Diagnosis Date  . Diabetic foot ulcer   . Pneumonia   . ETOH abuse   . Anxiety   . Arthritis   . Open wound     bottom of foot  . Diabetes mellitus without complication    AXIS IV:  other psychosocial or environmental problems and Substance abuse issues AXIS V:  64  Level of Care:  OP  Hospital Course:  After he left last time he did well for a little while. He states that his girlfriend left him and "clean his back account." He filed criminal charges against her. He states that he could not handle this and relapsed. He is drinking a case a day for several weeks. He states he has not been able to do much due to his knee pain (will have  to have it replaced) as well as the snow and ice. He basically stays at the house and drinks. Has tired to quit on his own but he starts withdrawing and goes back. Admits he also used some cocaine.  This is a second discharge summaries for this 47 y/o in less 2 months from this hospital. He was a patient in this hospital, on the substance abuse unit for alcoholism. He claimed this time around around that he relapsed after his gif-friend left him as well as cleaning him out financially. He hit the bottles to cope. He was admitted while alcohol intoxicated. Upon admission in this hospital, [name] was started on Librium protocol for his alcohol detoxification. He was also enrolled in group counseling sessions and activities to learn coping skills that should help him cope better and manage his substance abuse issues for a much longer sobriety. He also attended AA/NA meetings being offered and held on this unit. He has some previous and or identifiable medical conditions that required treatment or monitoring. He received medication management for all those health issues as well. He was monitored closely for any potential problems that may arise as of and or during detoxification treatment. Patient tolerated his treatment regimen and detoxification treatment without any significant adverse effects and or reactions presented.  Patient attended treatment team meeting this am and met with the team. His symptoms, substance abuse issues, response to to treatment and discharge plans discussed. Patient endorsed that he is doing well and stable for  discharge to pursue the next phase of his substance abuse treatment. He also endorsed that he needed to go home to prepare for his up coming knee surgery in April.  It was agreed upon that he will continue psychiatric care on an outpatient basis at [facility] psychiatric clinic in [place], [place] . He is instructed that this is a walk-in clinic between the hours of 08:30 and 11:00  am, M - F. [name] was also encouraged to join/attend AA/NA meetings being offered and held within his community. And to get a trusted sponsor from the advise of others or from whomever within the AA meetings seems to make sense, and has a proven track record, and will hold him responsible for his sobriety, and both expects and insists on total his abstinence from alcohol.   He currently being discharged to his home that he shares with his wife.  Upon discharge, patient adamantly denies suicidal, homicidal ideations, auditory, visual hallucinations, delusional thinking and or withdrawal symptoms. Patient left [facility] with all personal belongings in no apparent distress. He received 2 weeks worth samples of his discharge medications. Transportation per family. He has knee surgery scheduled sometime in April of this year.   Consults:  None  Significant Diagnostic Studies:  labs: CBC with diff, CMP, UDS, Toxicology tests  Discharge Vitals:   Blood pressure 144/74, pulse 99, temperature 98.3 F (36.8 C), temperature source Oral, resp. rate 24, height 6\' 2"  (1.88 m), weight 79.379 kg (175 lb). Body mass index is 22.46 kg/(m^2). Lab Results:   No results found for this or any previous visit (from the past 72 hour(s)).  Physical Findings: AIMS: Facial and Oral Movements Muscles of Facial Expression: None, normal Lips and Perioral Area: None, normal Jaw: None, normal Tongue: None, normal,Extremity Movements Upper (arms, wrists, hands, fingers): None, normal Lower (legs, knees, ankles, toes): None, normal, Trunk Movements Neck, shoulders, hips: None, normal, Overall Severity Severity of abnormal movements (highest score from questions above): None, normal Incapacitation due to abnormal movements: None, normal Patient's awareness of abnormal movements (rate only patient's report): No Awareness, Dental Status Current problems with teeth and/or dentures?: No Does patient usually wear dentures?: No   CIWA:  CIWA-Ar Total: 3 COWS:  COWS Total Score: 2  Psychiatric Specialty Exam: See Psychiatric Specialty Exam and Suicide Risk Assessment completed by Attending Physician prior to discharge.  Discharge destination:  Home  Is patient on multiple antipsychotic therapies at discharge:  No   Has Patient had three or more failed trials of antipsychotic monotherapy by history:  No  Recommended Plan for Multiple Antipsychotic Therapies: NA     Medication List    STOP taking these medications       LORazepam 1 MG tablet  Commonly known as:  ATIVAN     promethazine 25 MG tablet  Commonly known as:  PHENERGAN      TAKE these medications     Indication   escitalopram 10 MG tablet  Commonly known as:  LEXAPRO  Take 1 tablet (10 mg total) by mouth daily. For depression   Indication:  Depression, Generalized Anxiety Disorder     gabapentin 300 MG capsule  Commonly known as:  NEURONTIN  Take 1 capsule (300 mg total) by mouth 3 (three) times daily. For anxiety/pain control   Indication:  Agitation, Pain, Anxiety     ibuprofen 200 MG tablet  Commonly known as:  ADVIL,MOTRIN  Take 2 tablets (400 mg total) by mouth every 6 (six) hours as needed for  pain.   Indication:  Mild to Moderate Pain, Joint Damage causing Pain and Loss of Function     mirtazapine 30 MG tablet  Commonly known as:  REMERON  Take 1 tablet (30 mg total) by mouth at bedtime. For depression/sleep   Indication:  Trouble Sleeping, Major Depressive Disorder     QUEtiapine 50 MG tablet  Commonly known as:  SEROQUEL  Take 1 tablet (50 mg total) by mouth at bedtime. For mood control/insomnia   Indication:  Depressive Phase of Manic-Depression, Trouble Sleeping       Follow-up Information   Follow up with [facility]. (Patient can walk in for followup appointment betwee 8:30am and 11 am Monday - Friday.)    Contact information:   [address] [place] [address]  [phone]      Follow-up  recommendations: Activity:  As tolerated Diet: As recommended by your primary care doctor. Keep all scheduled follow-up appointments as recommended.  Continue to work the relapse prevention plan Comments:  Take all your medications as prescribed by your mental healthcare provider. Report any adverse effects and or reactions from your medicines to your outpatient provider promptly. Patient is instructed and cautioned to not engage in alcohol and or illegal drug use while on prescription medicines. In the event of worsening symptoms, patient is instructed to call the crisis hotline, 911 and or go to the nearest ED for appropriate evaluation and treatment of symptoms. Follow-up with your primary care provider for your other medical issues, concerns and or health care needs.    Total Discharge Time:  Greater than 30 minutes.  SignedArmandina Stammer I 04/01/2012, 9:21 AM

## 2012-04-01 NOTE — Progress Notes (Signed)
Patient ID: [name], male   DOB: [birth year], 47 y.o.   MRN: [id number] Patient denies SI/HI and A/V hallucinations; patient discharged per physician orders; patient given samples, prescriptions, copy of AVS after it was reviewed; patient had no other questions or concerns at this time; patient left the unit verbalized and signed that all belongings were returned and he left the unit ambulatory with his cane

## 2012-04-05 ENCOUNTER — Encounter (HOSPITAL_COMMUNITY): Payer: Self-pay

## 2012-04-06 ENCOUNTER — Encounter (HOSPITAL_COMMUNITY)
Admission: RE | Admit: 2012-04-06 | Discharge: 2012-04-06 | Disposition: A | Discharge status: Home / Self Care | Payer: Medicare Other | Source: Ambulatory Visit | Attending: Orthopedic Surgery | Admitting: Orthopedic Surgery

## 2012-04-06 ENCOUNTER — Encounter (HOSPITAL_COMMUNITY): Payer: Self-pay

## 2012-04-06 HISTORY — DX: Myoneural disorder, unspecified: G70.9

## 2012-04-06 HISTORY — DX: Gastro-esophageal reflux disease without esophagitis: K21.9

## 2012-04-06 LAB — CBC
Hemoglobin: 13.6 g/dL (ref 13.0–17.0)
MCH: 27.7 pg (ref 26.0–34.0)
Platelets: 187 10*3/uL (ref 150–400)
RBC: 4.91 MIL/uL (ref 4.22–5.81)
WBC: 7.7 10*3/uL (ref 4.0–10.5)

## 2012-04-06 LAB — COMPREHENSIVE METABOLIC PANEL
ALT: 42 U/L (ref 0–53)
AST: 33 U/L (ref 0–37)
Alkaline Phosphatase: 80 U/L (ref 39–117)
CO2: 26 mEq/L (ref 19–32)
Calcium: 8.9 mg/dL (ref 8.4–10.5)
Chloride: 104 mEq/L (ref 96–112)
GFR calc non Af Amer: >90 mL/min (ref >90)
Glucose, Bld: 95 mg/dL (ref 70–99)
Potassium: 3.8 mEq/L (ref 3.5–5.1)
Sodium: 140 mEq/L (ref 135–145)

## 2012-04-06 LAB — SURGICAL PCR SCREEN
MRSA, PCR: NEGATIVE
Staphylococcus aureus: NEGATIVE

## 2012-04-06 LAB — APTT: aPTT: 27 seconds (ref 24–37)

## 2012-04-06 LAB — TYPE AND SCREEN: ABO/RH(D): O NEG

## 2012-04-06 NOTE — Progress Notes (Signed)
Patient Discharge Instructions:  After Visit Summary (AVS):   Faxed to:  04/06/12 Discharge Summary Note:   Faxed to:  04/06/12 Psychiatric Admission Assessment Note:   Faxed to:  04/06/12 Suicide Risk Assessment - Discharge Assessment:   Faxed to:  04/06/12 Faxed/Sent to the Next Level Care provider:  04/06/12 Faxed to [facility] @ [phone]  Jerelene Redden, 04/06/2012, 3:52 PM

## 2012-04-06 NOTE — Pre-Procedure Instructions (Signed)
[name].  04/06/2012   Your procedure is scheduled on:  04-21-2022  Report to [facility] Short Stay Center at 6:30AM. Take the [facility] to the 3rd floor  Call this number if you have problems the morning of surgery: [phone]   Remember:   Do not eat food or drink liquids after midnight.   Take these medicines the morning of surgery with A SIP OF WATER: escitalopram(lexapro)   Do not wear jewelry,.  Do not wear lotions, powders, or perfumes. You may wear deodorant.  Do not shave 48 hours prior to surgery. Men may shave face and neck.  Do not bring valuables to the hospital.  Contacts, dentures or bridgework may not be worn into surgery.  Leave suitcase in the car. After surgery it may be brought to your room.   For patients admitted to the hospital, checkout time is 11:00 AM the day of discharge.   Patients discharged the day of surgery will not be allowed to drive home.   Special Instructions: Shower using CHG 2 nights before surgery and the night before surgery.  If you shower the day of surgery use CHG.  Use special wash - you have one bottle of CHG for all showers.  You should use approximately 1/3 of the bottle for each shower.   Please read over the following fact sheets that you were given: Pain Booklet, Coughing and Deep Breathing, Blood Transfusion Information and Surgical Site Infection Prevention

## 2012-04-11 IMAGING — US US ABDOMEN COMPLETE
1 series · 14 of 25 positions shown · non-contrast
Comparison: None.

CLINICAL DATA: Epigastric pain with nausea and vomiting

ABDOMINAL ULTRASOUND COMPLETE

[Series 1: us abdomen complete · 0.28mm/px · 14 of 74 slices shown]
[im 1/74]
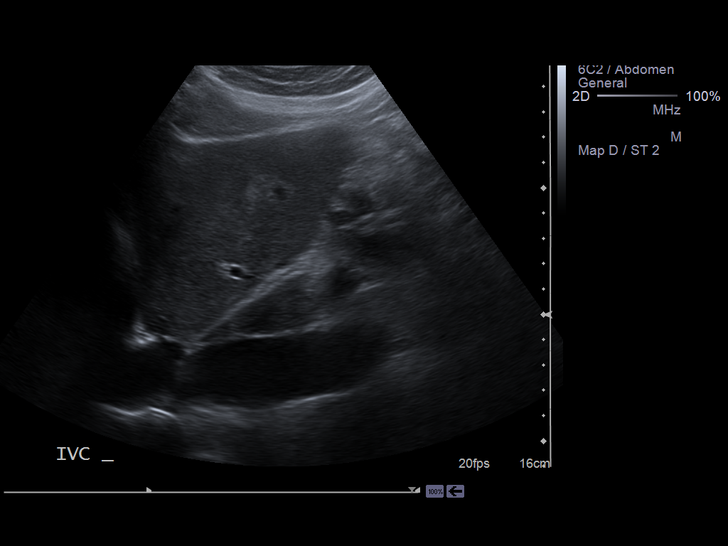
[im 7/74]
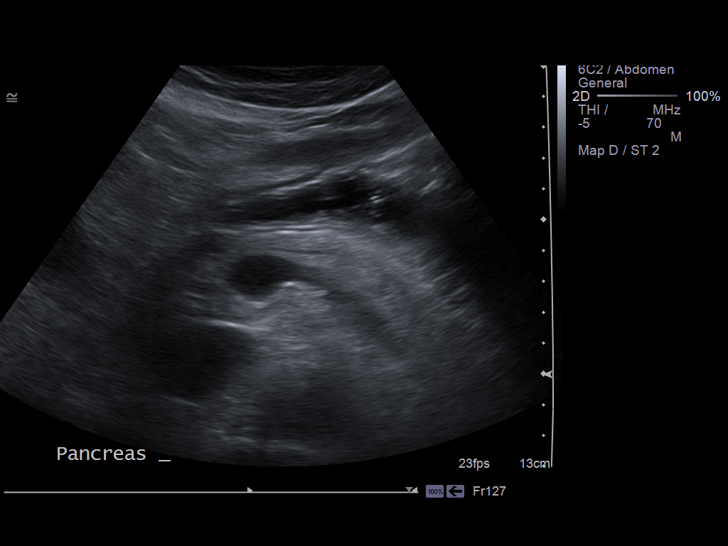
[im 13/74]
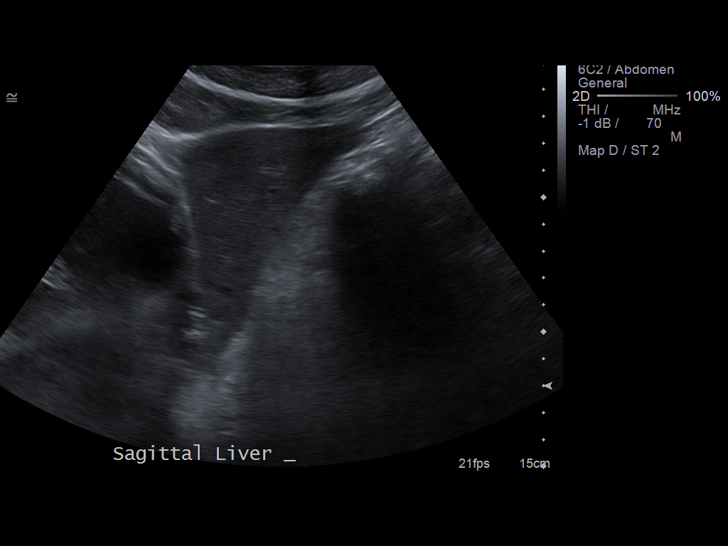
[im 19/74]
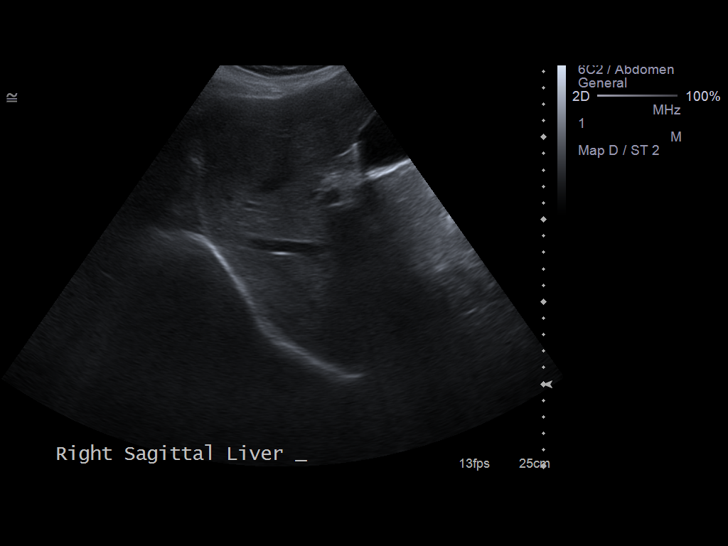
[im 25/74]
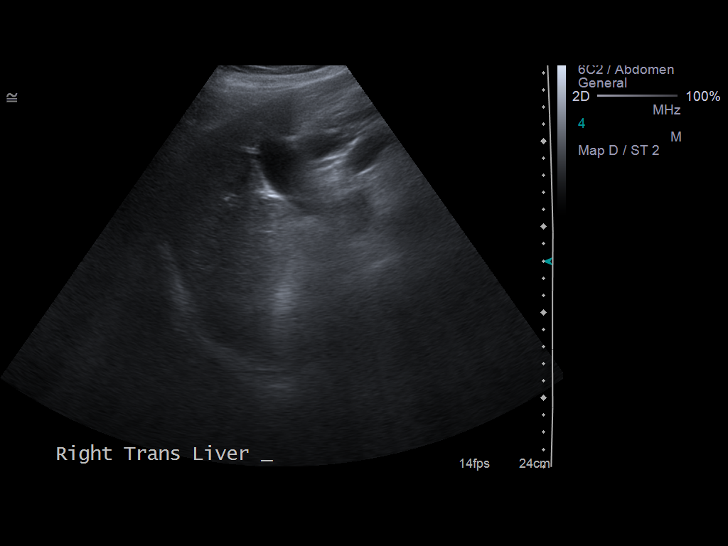
[im 28/74]
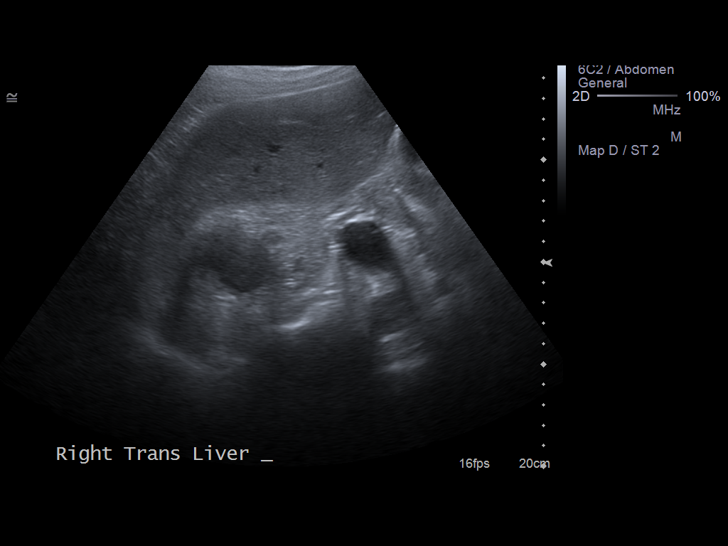
[im 34/74]
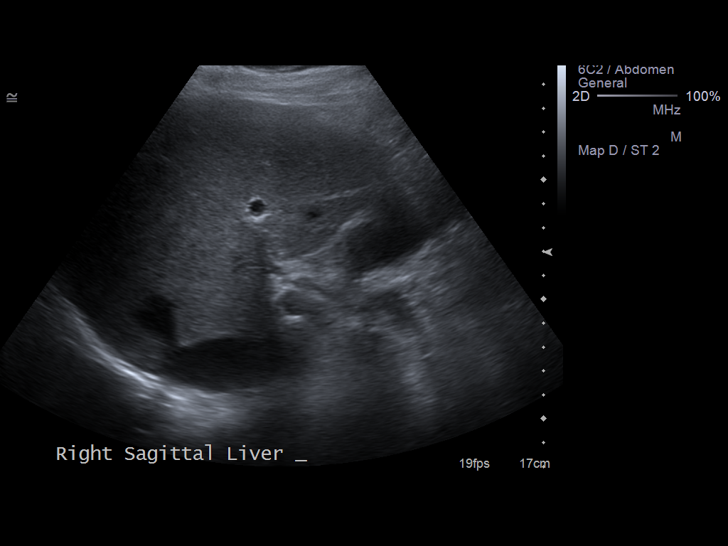
[im 40/74]
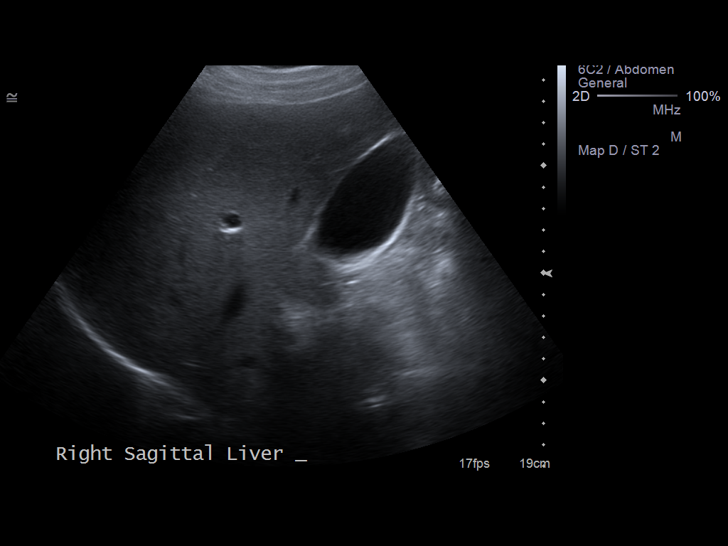
[im 46/74]
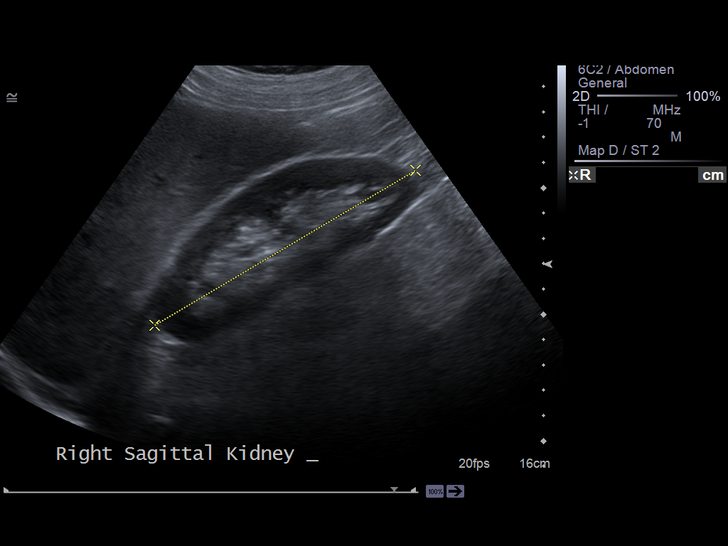
[im 49/74]
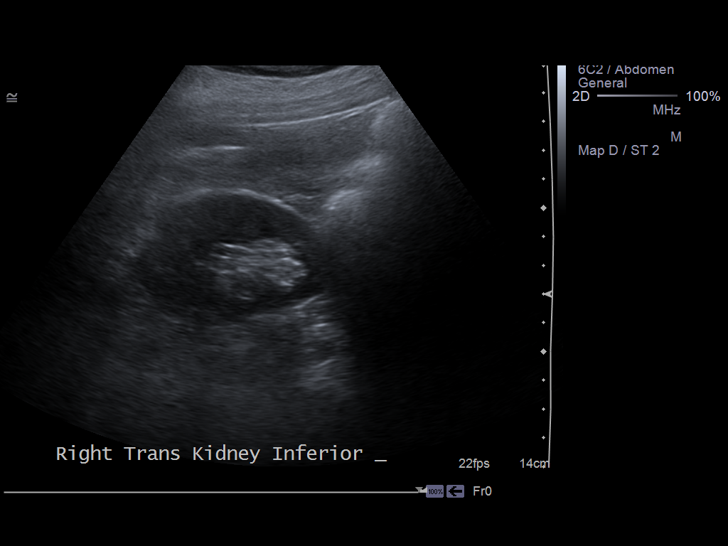
[im 55/74]
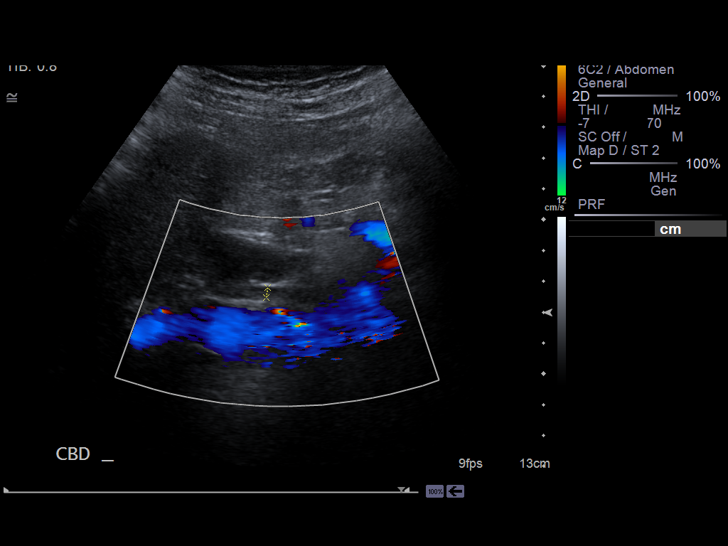
[im 61/74]
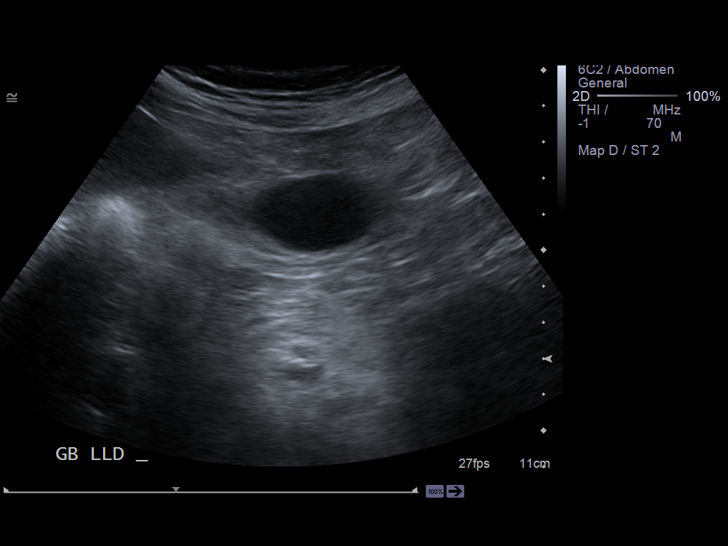
[im 67/74]
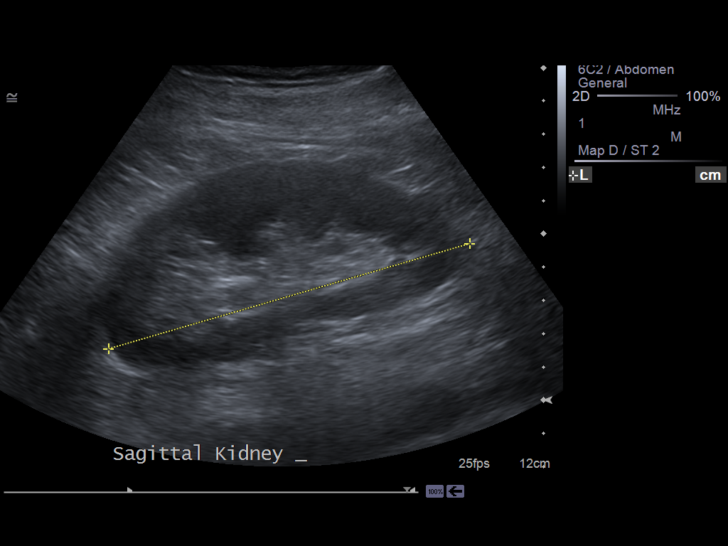
[im 74/74]
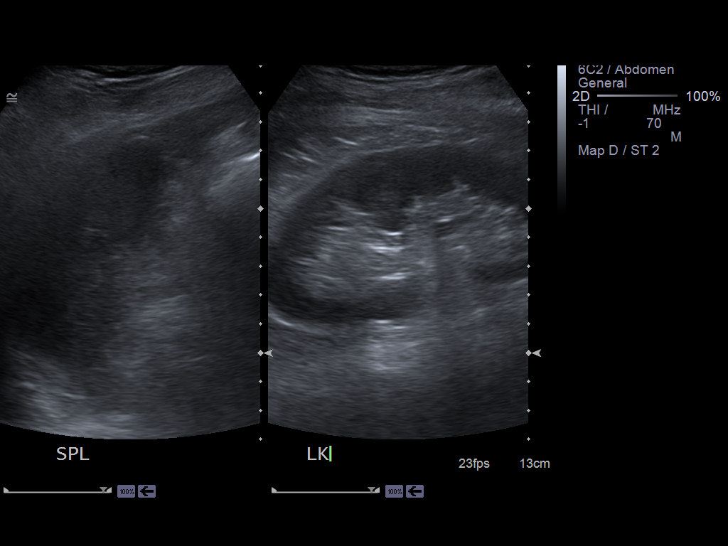

[14 of 25 positions shown; findings below may reference images not displayed]

FINDINGS: Gallbladder:  No gallstones, gallbladder wall thickening, or
pericholecystic fluid.

Common Bile Duct:  Within normal limits in caliber.

Liver: No focal mass lesion identified.  Within normal limits in
parenchymal echogenicity.

IVC:  Appears normal.

Pancreas:  No abnormality identified.

Spleen:  Within normal limits in size and echotexture.

Right kidney:  Normal in size and parenchymal echogenicity.  No
evidence of mass or hydronephrosis.

Left kidney:  Normal in size and parenchymal echogenicity.  No
evidence of mass or hydronephrosis.

Abdominal Aorta:  No aneurysm identified.

No ascites identified.
IMPRESSION: Negative abdominal ultrasound.

## 2012-04-19 MED ORDER — CEFAZOLIN SODIUM-DEXTROSE 2-3 GM-% IV SOLR
2.0000 g | INTRAVENOUS | Status: AC
  Administered 2012-04-20: 2 g via INTRAVENOUS
  Filled 2012-04-19: qty 50

## 2012-04-20 ENCOUNTER — Inpatient Hospital Stay (HOSPITAL_COMMUNITY): Payer: Medicare Other

## 2012-04-20 ENCOUNTER — Encounter (HOSPITAL_COMMUNITY)
Admission: RE | Disposition: A | Discharge status: Home Health | Payer: Self-pay | Source: Ambulatory Visit | Attending: Orthopedic Surgery

## 2012-04-20 ENCOUNTER — Inpatient Hospital Stay (HOSPITAL_COMMUNITY): Payer: Medicare Other | Admitting: Anesthesiology

## 2012-04-20 ENCOUNTER — Encounter (HOSPITAL_COMMUNITY): Payer: Self-pay | Admitting: Anesthesiology

## 2012-04-20 ENCOUNTER — Inpatient Hospital Stay (HOSPITAL_COMMUNITY)
Admission: RE | Admit: 2012-04-20 | Discharge: 2012-04-22 | DRG: 470 | Disposition: A | Discharge status: Home Health | Payer: Medicare Other | Source: Ambulatory Visit | Attending: Orthopedic Surgery | Admitting: Orthopedic Surgery

## 2012-04-20 DIAGNOSIS — Z96659 Presence of unspecified artificial knee joint: Secondary | ICD-10-CM

## 2012-04-20 DIAGNOSIS — Z79899 Other long term (current) drug therapy: Secondary | ICD-10-CM

## 2012-04-20 DIAGNOSIS — Z888 Allergy status to other drugs, medicaments and biological substances status: Secondary | ICD-10-CM

## 2012-04-20 DIAGNOSIS — E1142 Type 2 diabetes mellitus with diabetic polyneuropathy: Secondary | ICD-10-CM | POA: Diagnosis present

## 2012-04-20 DIAGNOSIS — K219 Gastro-esophageal reflux disease without esophagitis: Secondary | ICD-10-CM | POA: Diagnosis present

## 2012-04-20 DIAGNOSIS — Z01812 Encounter for preprocedural laboratory examination: Secondary | ICD-10-CM

## 2012-04-20 DIAGNOSIS — F411 Generalized anxiety disorder: Secondary | ICD-10-CM | POA: Diagnosis present

## 2012-04-20 DIAGNOSIS — E1169 Type 2 diabetes mellitus with other specified complication: Secondary | ICD-10-CM | POA: Diagnosis present

## 2012-04-20 DIAGNOSIS — Z7982 Long term (current) use of aspirin: Secondary | ICD-10-CM

## 2012-04-20 DIAGNOSIS — E1149 Type 2 diabetes mellitus with other diabetic neurological complication: Secondary | ICD-10-CM | POA: Diagnosis present

## 2012-04-20 DIAGNOSIS — F1410 Cocaine abuse, uncomplicated: Secondary | ICD-10-CM | POA: Diagnosis present

## 2012-04-20 DIAGNOSIS — Z96649 Presence of unspecified artificial hip joint: Secondary | ICD-10-CM

## 2012-04-20 DIAGNOSIS — M1710 Unilateral primary osteoarthritis, unspecified knee: Principal | ICD-10-CM | POA: Diagnosis present

## 2012-04-20 DIAGNOSIS — Z96651 Presence of right artificial knee joint: Secondary | ICD-10-CM

## 2012-04-20 DIAGNOSIS — F17200 Nicotine dependence, unspecified, uncomplicated: Secondary | ICD-10-CM | POA: Diagnosis present

## 2012-04-20 DIAGNOSIS — F1020 Alcohol dependence, uncomplicated: Secondary | ICD-10-CM | POA: Diagnosis present

## 2012-04-20 DIAGNOSIS — L97509 Non-pressure chronic ulcer of other part of unspecified foot with unspecified severity: Secondary | ICD-10-CM | POA: Diagnosis present

## 2012-04-20 HISTORY — DX: Personal history of other medical treatment: Z92.89

## 2012-04-20 HISTORY — PX: TOTAL KNEE ARTHROPLASTY: SHX125

## 2012-04-20 HISTORY — DX: Depression, unspecified: F32.A

## 2012-04-20 HISTORY — DX: Major depressive disorder, single episode, unspecified: F32.9

## 2012-04-20 HISTORY — DX: Mental disorder, not otherwise specified: F99

## 2012-04-20 HISTORY — DX: Cerebral infarction, unspecified: I63.9

## 2012-04-20 SURGERY — ARTHROPLASTY, KNEE, TOTAL
Anesthesia: General | Site: Knee | Laterality: Right | Wound class: Clean

## 2012-04-20 MED ORDER — PROPOFOL 10 MG/ML IV BOLUS
INTRAVENOUS | Status: DC | PRN
  Administered 2012-04-20: 200 mg via INTRAVENOUS

## 2012-04-20 MED ORDER — SODIUM CHLORIDE 0.9 % IR SOLN
Status: DC | PRN
  Administered 2012-04-20: 3000 mL

## 2012-04-20 MED ORDER — LACTATED RINGERS IV SOLN
INTRAVENOUS | Status: DC | PRN
  Administered 2012-04-20 (×2): via INTRAVENOUS

## 2012-04-20 MED ORDER — ONDANSETRON HCL 4 MG/2ML IJ SOLN: 4.0000 mg | Freq: Four times a day (QID) | INTRAMUSCULAR | Status: DC | PRN

## 2012-04-20 MED ORDER — CELECOXIB 200 MG PO CAPS
200.0000 mg | ORAL_CAPSULE | Freq: Two times a day (BID) | ORAL | Status: DC
  Administered 2012-04-20 – 2012-04-22 (×5): 200 mg via ORAL
  Filled 2012-04-20 (×6): qty 1

## 2012-04-20 MED ORDER — BISACODYL 5 MG PO TBEC: 5.0000 mg | DELAYED_RELEASE_TABLET | Freq: Every day | ORAL | Status: DC | PRN

## 2012-04-20 MED ORDER — MENTHOL 3 MG MT LOZG: 1.0000 | LOZENGE | OROMUCOSAL | Status: DC | PRN

## 2012-04-20 MED ORDER — HYDROMORPHONE HCL PF 1 MG/ML IJ SOLN
INTRAMUSCULAR | Status: AC
  Filled 2012-04-20: qty 1

## 2012-04-20 MED ORDER — POLYETHYLENE GLYCOL 3350 17 G PO PACK: 17.0000 g | PACK | Freq: Every day | ORAL | Status: DC | PRN

## 2012-04-20 MED ORDER — ACETAMINOPHEN 10 MG/ML IV SOLN
INTRAVENOUS | Status: AC
  Filled 2012-04-20: qty 100

## 2012-04-20 MED ORDER — LIDOCAINE HCL (CARDIAC) 20 MG/ML IV SOLN
INTRAVENOUS | Status: DC | PRN
  Administered 2012-04-20: 75 mg via INTRAVENOUS

## 2012-04-20 MED ORDER — SODIUM CHLORIDE 0.9 % IV SOLN: INTRAVENOUS | Status: DC

## 2012-04-20 MED ORDER — METOCLOPRAMIDE HCL 5 MG/ML IJ SOLN: 5.0000 mg | Freq: Three times a day (TID) | INTRAMUSCULAR | Status: DC | PRN

## 2012-04-20 MED ORDER — OXYCODONE HCL 5 MG/5ML PO SOLN: 5.0000 mg | Freq: Once | ORAL | Status: DC | PRN

## 2012-04-20 MED ORDER — OXYCODONE HCL 5 MG PO TABS
5.0000 mg | ORAL_TABLET | ORAL | Status: DC | PRN
  Administered 2012-04-20 – 2012-04-22 (×14): 10 mg via ORAL
  Filled 2012-04-20 (×14): qty 2

## 2012-04-20 MED ORDER — ACETAMINOPHEN 10 MG/ML IV SOLN
1000.0000 mg | Freq: Four times a day (QID) | INTRAVENOUS | Status: DC
  Administered 2012-04-20: 1000 mg via INTRAVENOUS

## 2012-04-20 MED ORDER — QUETIAPINE FUMARATE 50 MG PO TABS
50.0000 mg | ORAL_TABLET | Freq: Every day | ORAL | Status: DC
  Administered 2012-04-20 – 2012-04-21 (×2): 50 mg via ORAL
  Filled 2012-04-20 (×3): qty 1

## 2012-04-20 MED ORDER — METHOCARBAMOL 100 MG/ML IJ SOLN
500.0000 mg | Freq: Four times a day (QID) | INTRAVENOUS | Status: DC | PRN
  Filled 2012-04-20: qty 5

## 2012-04-20 MED ORDER — HYDROMORPHONE HCL PF 1 MG/ML IJ SOLN
0.2500 mg | INTRAMUSCULAR | Status: DC | PRN
  Administered 2012-04-20 (×4): 0.5 mg via INTRAVENOUS

## 2012-04-20 MED ORDER — MIRTAZAPINE 30 MG PO TABS
30.0000 mg | ORAL_TABLET | Freq: Every day | ORAL | Status: DC
  Administered 2012-04-20 – 2012-04-21 (×2): 30 mg via ORAL
  Filled 2012-04-20 (×3): qty 1

## 2012-04-20 MED ORDER — MAGNESIUM CITRATE PO SOLN
1.0000 | Freq: Once | ORAL | Status: AC | PRN
  Filled 2012-04-20: qty 296

## 2012-04-20 MED ORDER — DOCUSATE SODIUM 100 MG PO CAPS
100.0000 mg | ORAL_CAPSULE | Freq: Two times a day (BID) | ORAL | Status: DC
  Administered 2012-04-20 – 2012-04-22 (×5): 100 mg via ORAL
  Filled 2012-04-20 (×5): qty 1

## 2012-04-20 MED ORDER — MIDAZOLAM HCL 5 MG/5ML IJ SOLN
INTRAMUSCULAR | Status: DC | PRN
  Administered 2012-04-20: 2 mg via INTRAVENOUS

## 2012-04-20 MED ORDER — ESCITALOPRAM OXALATE 10 MG PO TABS
10.0000 mg | ORAL_TABLET | Freq: Every day | ORAL | Status: DC
  Administered 2012-04-21 – 2012-04-22 (×2): 10 mg via ORAL
  Filled 2012-04-20 (×2): qty 1

## 2012-04-20 MED ORDER — ONDANSETRON HCL 4 MG PO TABS: 4.0000 mg | ORAL_TABLET | Freq: Four times a day (QID) | ORAL | Status: DC | PRN

## 2012-04-20 MED ORDER — CEFAZOLIN SODIUM 1-5 GM-% IV SOLN
1.0000 g | Freq: Four times a day (QID) | INTRAVENOUS | Status: AC
  Administered 2012-04-20 (×2): 1 g via INTRAVENOUS
  Filled 2012-04-20 (×2): qty 50

## 2012-04-20 MED ORDER — BUPIVACAINE-EPINEPHRINE PF 0.5-1:200000 % IJ SOLN
INTRAMUSCULAR | Status: DC | PRN
  Administered 2012-04-20: 30 mL

## 2012-04-20 MED ORDER — FENTANYL CITRATE 0.05 MG/ML IJ SOLN
INTRAMUSCULAR | Status: DC | PRN
  Administered 2012-04-20 (×2): 50 ug via INTRAVENOUS
  Administered 2012-04-20: 100 ug via INTRAVENOUS
  Administered 2012-04-20: 50 ug via INTRAVENOUS

## 2012-04-20 MED ORDER — PHENOL 1.4 % MT LIQD: 1.0000 | OROMUCOSAL | Status: DC | PRN

## 2012-04-20 MED ORDER — HYDROMORPHONE HCL PF 1 MG/ML IJ SOLN
1.0000 mg | INTRAMUSCULAR | Status: DC | PRN
  Administered 2012-04-20 – 2012-04-21 (×4): 1 mg via INTRAVENOUS
  Filled 2012-04-20 (×4): qty 1

## 2012-04-20 MED ORDER — OXYCODONE HCL 5 MG PO TABS: 5.0000 mg | ORAL_TABLET | Freq: Once | ORAL | Status: DC | PRN

## 2012-04-20 MED ORDER — GABAPENTIN 300 MG PO CAPS
300.0000 mg | ORAL_CAPSULE | Freq: Three times a day (TID) | ORAL | Status: DC
  Administered 2012-04-20 – 2012-04-22 (×6): 300 mg via ORAL
  Filled 2012-04-20 (×8): qty 1

## 2012-04-20 MED ORDER — METOCLOPRAMIDE HCL 10 MG PO TABS: 5.0000 mg | ORAL_TABLET | Freq: Three times a day (TID) | ORAL | Status: DC | PRN

## 2012-04-20 MED ORDER — ASPIRIN EC 325 MG PO TBEC
325.0000 mg | DELAYED_RELEASE_TABLET | Freq: Every day | ORAL | Status: DC
  Administered 2012-04-21 – 2012-04-22 (×2): 325 mg via ORAL
  Filled 2012-04-20 (×3): qty 1

## 2012-04-20 MED ORDER — NICOTINE 21 MG/24HR TD PT24
21.0000 mg | MEDICATED_PATCH | Freq: Every day | TRANSDERMAL | Status: DC
  Administered 2012-04-20 – 2012-04-22 (×3): 21 mg via TRANSDERMAL
  Filled 2012-04-20 (×3): qty 1

## 2012-04-20 MED ORDER — ACETAMINOPHEN 325 MG PO TABS: 650.0000 mg | ORAL_TABLET | Freq: Four times a day (QID) | ORAL | Status: DC | PRN

## 2012-04-20 MED ORDER — ALUM & MAG HYDROXIDE-SIMETH 200-200-20 MG/5ML PO SUSP: 30.0000 mL | ORAL | Status: DC | PRN

## 2012-04-20 MED ORDER — METHOCARBAMOL 500 MG PO TABS
500.0000 mg | ORAL_TABLET | Freq: Four times a day (QID) | ORAL | Status: DC | PRN
  Administered 2012-04-20 – 2012-04-22 (×5): 500 mg via ORAL
  Filled 2012-04-20 (×6): qty 1

## 2012-04-20 MED ORDER — ACETAMINOPHEN 650 MG RE SUPP: 650.0000 mg | Freq: Four times a day (QID) | RECTAL | Status: DC | PRN

## 2012-04-20 SURGICAL SUPPLY — 48 items
BLADE SAGITTAL 25.0X1.27X90 (BLADE) ×2 IMPLANT
BLADE SAW SGTL 13.0X1.19X90.0M (BLADE) ×2 IMPLANT
BLADE SURG 21 STRL SS (BLADE) ×4 IMPLANT
BNDG COHESIVE 6X5 TAN STRL LF (GAUZE/BANDAGES/DRESSINGS) ×3 IMPLANT
BONE CEMENT PALACOSE (Orthopedic Implant) ×4 IMPLANT
BOWL SMART MIX CTS (DISPOSABLE) IMPLANT
CEMENT BONE PALACOSE (Orthopedic Implant) ×2 IMPLANT
CLOTH BEACON ORANGE TIMEOUT ST (SAFETY) ×2 IMPLANT
COVER SURGICAL LIGHT HANDLE (MISCELLANEOUS) ×2 IMPLANT
CUFF TOURNIQUET SINGLE 34IN LL (TOURNIQUET CUFF) IMPLANT
CUFF TOURNIQUET SINGLE 44IN (TOURNIQUET CUFF) IMPLANT
DRAPE EXTREMITY T 121X128X90 (DRAPE) ×2 IMPLANT
DRAPE PROXIMA HALF (DRAPES) ×2 IMPLANT
DRAPE U-SHAPE 47X51 STRL (DRAPES) ×2 IMPLANT
DRSG ADAPTIC 3X8 NADH LF (GAUZE/BANDAGES/DRESSINGS) ×2 IMPLANT
DRSG PAD ABDOMINAL 8X10 ST (GAUZE/BANDAGES/DRESSINGS) ×2 IMPLANT
DURAPREP 26ML APPLICATOR (WOUND CARE) ×2 IMPLANT
ELECT REM PT RETURN 9FT ADLT (ELECTROSURGICAL) ×2
ELECTRODE REM PT RTRN 9FT ADLT (ELECTROSURGICAL) ×1 IMPLANT
FACESHIELD LNG OPTICON STERILE (SAFETY) ×2 IMPLANT
GLOVE BIOGEL PI IND STRL 9 (GLOVE) ×1 IMPLANT
GLOVE BIOGEL PI INDICATOR 9 (GLOVE) ×1
GLOVE SURG ORTHO 9.0 STRL STRW (GLOVE) ×2 IMPLANT
GOWN PREVENTION PLUS XLARGE (GOWN DISPOSABLE) ×2 IMPLANT
GOWN SRG XL XLNG 56XLVL 4 (GOWN DISPOSABLE) ×2 IMPLANT
GOWN STRL NON-REIN XL XLG LVL4 (GOWN DISPOSABLE) ×4
HANDPIECE INTERPULSE COAX TIP (DISPOSABLE)
KIT BASIN OR (CUSTOM PROCEDURE TRAY) ×2 IMPLANT
KIT ROOM TURNOVER OR (KITS) ×2 IMPLANT
MANIFOLD NEPTUNE II (INSTRUMENTS) ×2 IMPLANT
NDL SPNL 18GX3.5 QUINCKE PK (NEEDLE) ×1 IMPLANT
NEEDLE SPNL 18GX3.5 QUINCKE PK (NEEDLE) ×2 IMPLANT
NS IRRIG 1000ML POUR BTL (IV SOLUTION) ×2 IMPLANT
PACK TOTAL JOINT (CUSTOM PROCEDURE TRAY) ×2 IMPLANT
PAD ARMBOARD 7.5X6 YLW CONV (MISCELLANEOUS) ×4 IMPLANT
PADDING CAST COTTON 6X4 STRL (CAST SUPPLIES) ×2 IMPLANT
SET HNDPC FAN SPRY TIP SCT (DISPOSABLE) IMPLANT
SPONGE GAUZE 4X4 12PLY (GAUZE/BANDAGES/DRESSINGS) ×2 IMPLANT
STAPLER VISISTAT 35W (STAPLE) ×2 IMPLANT
SUCTION FRAZIER TIP 10 FR DISP (SUCTIONS) IMPLANT
SUT VIC AB 0 CTB1 27 (SUTURE) IMPLANT
SUT VIC AB 1 CTX 36 (SUTURE)
SUT VIC AB 1 CTX36XBRD ANBCTR (SUTURE) IMPLANT
TOWEL OR 17X24 6PK STRL BLUE (TOWEL DISPOSABLE) ×2 IMPLANT
TOWEL OR 17X26 10 PK STRL BLUE (TOWEL DISPOSABLE) ×2 IMPLANT
TRAY FOLEY CATH 14FR (SET/KITS/TRAYS/PACK) IMPLANT
WATER STERILE IRR 1000ML POUR (IV SOLUTION) ×4 IMPLANT
WRAP KNEE MAXI GEL POST OP (GAUZE/BANDAGES/DRESSINGS) ×2 IMPLANT

## 2012-04-20 NOTE — H&P (Signed)
TOTAL KNEE ADMISSION H&P  Patient is being admitted for right total knee arthroplasty.  Subjective:  Chief Complaint:right knee pain.  HPI: Javier Land., 47 y.o. male, has a history of pain and functional disability in the right knee due to arthritis and has failed non-surgical conservative treatments for greater than 12 weeks to includeNSAID's and/or analgesics, corticosteriod injections, viscosupplementation injections, flexibility and strengthening excercises, use of assistive devices and activity modification.  Onset of symptoms was gradual, starting 8 years ago with gradually worsening course since that time. The patient noted no past surgery on the right knee(s).  Patient currently rates pain in the right knee(s) at 8 out of 10 with activity. Patient has night pain, worsening of pain with activity and weight bearing, pain that interferes with activities of daily living, pain with passive range of motion, crepitus and joint swelling.  Patient has evidence of subchondral cysts, subchondral sclerosis, periarticular osteophytes and joint space narrowing by imaging studies. This patient has had Failed conservative treatment. There is no active infection.  Patient Active Problem List   Diagnosis Date Noted  . Cocaine abuse 03/29/2012  . Generalized anxiety disorder 03/29/2012  . Panic attacks 03/29/2012  . Alcohol abuse, continuous 02/10/2012    Class: Chronic  . Alcohol dependence 08/05/2011    Class: Chronic  . Substance abuse 05/31/2011  . Foot ulcer, right 02/15/2011  . Fasting hyperglycemia 02/15/2011  . Neuropathic ulcer of foot 02/15/2011  . History of ETOH abuse 02/15/2011   Past Medical History  Diagnosis Date  . Diabetic foot ulcer   . Pneumonia   . ETOH abuse   . Anxiety   . Arthritis   . Open wound     bottom of foot  . Diabetes mellitus without complication     borderline  . Neuromuscular disorder     neuropathy  . GERD (gastroesophageal reflux disease)      tums    Past Surgical History  Procedure Laterality Date  . Total hip arthroplasty    . Total knee arthroplasty    . Joint replacement    . Lung lobectomy    . Hip surgery    . Knee surgery    . Lung lobectomy    . Metatarsal osteotomy  10/29/2011    Procedure: METATARSAL OSTEOTOMY;  Surgeon: Nadara Mustard, MD;  Location: [facility] OR;  Service: Orthopedics;  Laterality: Left;  Left 1st Metatarsal Dorsal Closing Wedge     Prescriptions prior to admission  Medication Sig Dispense Refill  . escitalopram (LEXAPRO) 10 MG tablet Take 1 tablet (10 mg total) by mouth daily. For depression  30 tablet  0  . gabapentin (NEURONTIN) 300 MG capsule Take 1 capsule (300 mg total) by mouth 3 (three) times daily. For anxiety/pain control  90 capsule  0  . ibuprofen (ADVIL,MOTRIN) 200 MG tablet Take 2 tablets (400 mg total) by mouth every 6 (six) hours as needed for pain.  30 tablet    . mirtazapine (REMERON) 30 MG tablet Take 1 tablet (30 mg total) by mouth at bedtime. For depression/sleep  30 tablet  0  . QUEtiapine (SEROQUEL) 50 MG tablet Take 1 tablet (50 mg total) by mouth at bedtime. For mood control/insomnia  30 tablet  0   Allergies  Allergen Reactions  . Benadryl (Diphenhydramine Hcl) Other (See Comments)    Pt states leg spasm  . Trazodone And Nefazodone Other (See Comments)    Leg Spasms.     History  Substance Use  Topics  . Smoking status: Current Every Day Smoker -- 1.50 packs/day for 30 years    Types: Cigarettes  . Smokeless tobacco: Not on file  . Alcohol Use: No     Comment: not currently drinking hx. alcohol abuse in past    No family history on file.   Review of Systems  All other systems reviewed and are negative.    Objective:  Physical Exam  Vital signs in last 24 hours: Temp:  [97.1 F (36.2 C)] 97.1 F (36.2 C) (04/02 0622) Pulse Rate:  [73] 73 (04/02 0622) Resp:  [20] 20 (04/02 0622) BP: (146)/(88) 146/88 mmHg (04/02 0622) SpO2:  [100 %] 100 % (04/02  0622)  Labs:   Estimated body mass index is 21.87 kg/(m^2) as calculated from the following:   Height as of 04/06/12: 6\' 3"  (1.905 m).   Weight as of 03/02/12: 79.379 kg (175 lb).   Imaging Review Plain radiographs demonstrate moderate degenerative joint disease of the right knee(s). The overall alignment ismild varus. The bone quality appears to be adequate for age and reported activity level.  Assessment/Plan:  End stage arthritis, right knee   The patient history, physical examination, clinical judgment of the provider and imaging studies are consistent with end stage degenerative joint disease of the right knee(s) and total knee arthroplasty is deemed medically necessary. The treatment options including medical management, injection therapy arthroscopy and arthroplasty were discussed at length. The risks and benefits of total knee arthroplasty were presented and reviewed. The risks due to aseptic loosening, infection, stiffness, patella tracking problems, thromboembolic complications and other imponderables were discussed. The patient acknowledged the explanation, agreed to proceed with the plan and consent was signed. Patient is being admitted for inpatient treatment for surgery, pain control, PT, OT, prophylactic antibiotics, VTE prophylaxis, progressive ambulation and ADL's and discharge planning. The patient is planning to be discharged home with home health services

## 2012-04-20 NOTE — Anesthesia Postprocedure Evaluation (Signed)
Anesthesia Post Note  Patient: [name].  Procedure(s) Performed: Procedure(s) (LRB): TOTAL KNEE ARTHROPLASTY (Right)  Anesthesia type: General  Patient location: PACU  Post pain: Pain level controlled and Adequate analgesia  Post assessment: Post-op Vital signs reviewed, Patient's Cardiovascular Status Stable, Respiratory Function Stable, Patent Airway and Pain level controlled  Last Vitals:  Filed Vitals:   04/20/12 1113  BP: 135/87  Pulse: 90  Temp:   Resp: 15    Post vital signs: Reviewed and stable  Level of consciousness: awake, alert  and oriented  Complications: No apparent anesthesia complications

## 2012-04-20 NOTE — Transfer of Care (Signed)
Immediate Anesthesia Transfer of Care Note  Patient: [name].  Procedure(s) Performed: Procedure(s) with comments: TOTAL KNEE ARTHROPLASTY (Right) - Right Total Knee Arthroplasty  Patient Location: PACU  Anesthesia Type:GA combined with regional for post-op pain  Level of Consciousness: awake and alert   Airway & Oxygen Therapy: Patient Spontanous Breathing and Patient connected to nasal cannula oxygen  Post-op Assessment: Report given to PACU RN and Post -op Vital signs reviewed and stable  Post vital signs: Reviewed and stable  Complications: No apparent anesthesia complications

## 2012-04-20 NOTE — Preoperative (Signed)
Beta Blockers   Reason not to administer Beta Blockers:Not Applicable 

## 2012-04-20 NOTE — Progress Notes (Signed)
UR COMPLETED  

## 2012-04-20 NOTE — Evaluation (Signed)
Physical Therapy Evaluation Patient Details Name: [name]. MRN: [id number] DOB: [birth year] Today's Date: 04/20/2012 Time: 4098-1191 PT Time Calculation (min): 32 min  PT Assessment / Plan / Recommendation Clinical Impression  Pt is a 47 y/o male s/p R TKA. Pt doing well with mobility and should progress quickly. Acute PT to follow pt to maximize functional mobility for safe d/c to home.     PT Assessment  Patient needs continued PT services    Follow Up Recommendations  Home health PT    Does the patient have the potential to tolerate intense rehabilitation      Barriers to Discharge None      Equipment Recommendations  None recommended by PT    Recommendations for Other Services     Frequency 7X/week    Precautions / Restrictions Precautions Precautions: Knee Restrictions Weight Bearing Restrictions: Yes RLE Weight Bearing: Weight bearing as tolerated   Pertinent Vitals/Pain 10/10 pain in knee RN notified.      Mobility  Bed Mobility Bed Mobility: Supine to Sit;Sitting - Scoot to Edge of Bed Supine to Sit: 4: Min assist;HOB flat;With rails Sitting - Scoot to Edge of Bed: 4: Min assist Details for Bed Mobility Assistance: Assist for R LE.  Transfers Transfers: Sit to Stand;Stand to [employer] Transfers Sit to Stand: 4: Min assist;From bed;With upper extremity assist Stand to Sit: 4: Min assist Stand Pivot Transfers: 4: Min assist Details for Transfer Assistance: VCs for technique.  Close supervsion for safety.  Ambulation/Gait Ambulation/Gait Assistance: 4: Min assist Assistive device: Rolling walker Gait Pattern: Step-to pattern Stairs: No    Exercises Total Joint Exercises Ankle Circles/Pumps: 10 reps   PT Diagnosis: Difficulty walking;Generalized weakness;Acute pain  PT Problem List: Decreased strength;Decreased activity tolerance;Decreased mobility;Decreased knowledge of use of DME;Pain;Decreased range of motion PT Treatment Interventions:  Gait training;Stair training;Functional mobility training;DME instruction;Therapeutic exercise;Therapeutic activities;Patient/family education   PT Goals Acute Rehab PT Goals PT Goal Formulation: With patient Time For Goal Achievement: 04/27/12 Potential to Achieve Goals: Good Pt will go Supine/Side to Sit: with modified independence PT Goal: Supine/Side to Sit - Progress: Goal set today Pt will go Sit to Supine/Side: with modified independence PT Goal: Sit to Supine/Side - Progress: Goal set today Pt will Transfer Bed to Chair/Chair to Bed: with modified independence PT Transfer Goal: Bed to Chair/Chair to Bed - Progress: Goal set today Pt will Ambulate: >150 feet;with modified independence;with rolling walker PT Goal: Ambulate - Progress: Goal set today Pt will Go Up / Down Stairs: 3-5 stairs;with supervision;with least restrictive assistive device PT Goal: Up/Down Stairs - Progress: Goal set today Pt will Perform Home Exercise Program: Independently PT Goal: Perform Home Exercise Program - Progress: Goal set today  Visit Information  Last PT Received On: 04/20/12 Assistance Needed: +1    Subjective Data  Subjective: Agree to PT eval Patient Stated Goal: walk without pain.    Prior Functioning  Home Living Lives With: Alone Available Help at Discharge: Family Type of Home: Mobile home Home Access: Stairs to enter Secretary/administrator of Steps: 5 Entrance Stairs-Rails: Right;Left;Can reach both Home Layout: One level Home Adaptive Equipment: Bedside commode/3-in-1;Walker - rolling Prior Function Level of Independence: Independent Able to Take Stairs?: Yes Driving: Yes Vocation: On disability Communication Communication: No difficulties    Cognition  Cognition Overall Cognitive Status: Appears within functional limits for tasks assessed/performed Arousal/Alertness: Awake/alert Orientation Level: Oriented X4 / Intact Behavior During Session: Anxious     Extremity/Trunk Assessment Right Lower Extremity  Assessment RLE ROM/Strength/Tone: Unable to fully assess Left Lower Extremity Assessment LLE ROM/Strength/Tone: [facility] for tasks assessed   Balance Balance Balance Assessed: No  End of Session PT - End of Session Equipment Utilized During Treatment: Gait belt Activity Tolerance: Patient tolerated treatment well Patient left: in chair;with call bell/phone within reach Nurse Communication: Mobility status  GP     Makaelyn Aponte 04/20/2012, 6:11 PM Gerrell Tabet L. Keylin Podolsky DPT [phone]

## 2012-04-20 NOTE — Anesthesia Preprocedure Evaluation (Signed)
Anesthesia Evaluation  Patient identified by MRN, date of birth, ID band Patient awake    Reviewed: Allergy & Precautions, H&P , NPO status , Patient's Chart, lab work & pertinent test results  Airway Mallampati: II  Neck ROM: full    Dental   Pulmonary Current Smoker,  S/p lobectomy         Cardiovascular     Neuro/Psych Anxiety  Neuromuscular disease    GI/Hepatic GERD-  ,(+)     substance abuse  alcohol use and cocaine use,   Endo/Other  diabetes, Type 2  Renal/GU      Musculoskeletal  (+) Arthritis -,   Abdominal   Peds  Hematology   Anesthesia Other Findings   Reproductive/Obstetrics                           Anesthesia Physical Anesthesia Plan  ASA: III  Anesthesia Plan: General and Regional   Post-op Pain Management: MAC Combined w/ Regional for Post-op pain   Induction: Intravenous  Airway Management Planned: LMA  Additional Equipment:   Intra-op Plan:   Post-operative Plan:   Informed Consent: I have reviewed the patients History and Physical, chart, labs and discussed the procedure including the risks, benefits and alternatives for the proposed anesthesia with the patient or authorized representative who has indicated his/her understanding and acceptance.     Plan Discussed with: CRNA and Surgeon  Anesthesia Plan Comments:         Anesthesia Quick Evaluation

## 2012-04-20 NOTE — Progress Notes (Signed)
Orthopedic Tech Progress Note Patient Details:  [name] 1965/12/22 [id number]  Patient ID: [name], male   DOB: 03/31/1965, 47 y.o.   MRN: [id number]   Shawnie Pons 04/20/2012, 12:01 PMtrapeze bar

## 2012-04-20 NOTE — Op Note (Signed)
OPERATIVE REPORT  DATE OF SURGERY: 04/20/2012  PATIENT:  Javier Land.,  47 y.o. male  PRE-OPERATIVE DIAGNOSIS:  Osteoarthritis Right Knee  POST-OPERATIVE DIAGNOSIS:  Osteoarthritis Right Knee  PROCEDURE:  Procedure(s): TOTAL KNEE ARTHROPLASTY Zimmer components. Size 5 tibia. Size F. femur. 17 mm polyethylene tray. 32 mm patella.  SURGEON:  Surgeon(s): Nadara Mustard, MD  ANESTHESIA:   regional and general  EBL:  Minimal ML  SPECIMEN:  No Specimen  TOURNIQUET:   Total Tourniquet Time Documented: Thigh (Right) - 42 minutes Total: Thigh (Right) - 42 minutes   PROCEDURE DETAILS: Patient is a 47 year old gentleman with osteoarthritis of his right knee. He is status post total knee arthroplasty on the left. He has progressed the point where he is pain with activities of daily living he is unable to go up and down stairs and presents at this time for total knee arthroplasty. Risks and benefits were discussed including infection neurovascular injury persistent pain DVT pulmonary embolus need for additional surgery. Patient states he understands and wished to proceed at this time. Description of procedure patient was brought to the operating room and underwent a general anesthetic after femoral block. After adequate levels of anesthesia were obtained patient's right lower extremity was prepped using DuraPrep draped into a sterile field an [place] was used to cover all exposed skin. A midline incision was made carried down to a medial parapatellar retinacular incision. IM guide was used for the femur and this was set for 5 of valgus 10 mm was taken off the distal femur. Attention was then focused on the tibia 10 mm cut with 7 posterior slope was taken off the tibia with neutral varus and valgus alignment. This sized for a size 5 tibia and the keel punch was made for the size 5 tibia. The femur was incised and this sized for a size F. The chamfer cuts were made for size F as well as the box  cut. The trial femoral and tibial components were placed and patient had stable alignment and full extension with a 17 mm polyethylene tray. The patella was resurfaced 10 mm was taken off the patella for a 32 mm patella button. The wounds were irrigated with normal saline the meniscus and loose debris was removed. The tibial and femoral components were cemented in place loose cement was removed again pulsatile lavage was performed throughout the case. The knee was left in extension with a 17 mm polyethylene tray with the locking screw in place until the cement hardened the patella was left clamped in place until the cement hardened. The knee was placed through full range of motion the patella tracked midline. Retinaculum was closed using #1 Vicryl subcutaneous is closed using 0 Vicryl skin was closed using staples. The wound was covered with Adaptic orthopedic sponges AB dressing Kerlix and Coban. A foam pad was placed to maintain extension under the heel. Extubated taken to the PACU in stable condition.  PLAN OF CARE: Admit to inpatient   PATIENT DISPOSITION:  PACU - hemodynamically stable.   Nadara Mustard, MD 04/20/2012 10:04 AM

## 2012-04-20 NOTE — Anesthesia Procedure Notes (Addendum)
Procedure Name: LMA Insertion Date/Time: 04/20/2012 8:42 AM Performed by: Gwenyth Allegra Pre-anesthesia Checklist: Patient identified, Timeout performed, Emergency Drugs available, Suction available and Patient being monitored Patient Re-evaluated:Patient Re-evaluated prior to inductionOxygen Delivery Method: Circle system utilized Preoxygenation: Pre-oxygenation with 100% oxygen Intubation Type: IV induction LMA: LMA inserted LMA Size: 5.0 Number of attempts: 1 Tube secured with: Tape Dental Injury: Teeth and Oropharynx as per pre-operative assessment    Anesthesia Regional Block:  Femoral nerve block  Pre-Anesthetic Checklist: ,, timeout performed, Correct Patient, Correct Site, Correct Laterality, Correct Procedure,, site marked, risks and benefits discussed, Surgical consent,  Pre-op evaluation,  At surgeon's request and post-op pain management  Laterality: Right  Prep: chloraprep       Needles:  Injection technique: Single-shot  Needle Type: Echogenic Stimulator Needle     Needle Length: 9cm  Needle Gauge: 21    Additional Needles:  Procedures: nerve stimulator Femoral nerve block  Nerve Stimulator or Paresthesia:  Response: Quadriceps muscle contraction, 0.45 mA,   Additional Responses:   Narrative:  Start time: 04/20/2012 8:07 AM End time: 04/20/2012 8:18 AM Injection made incrementally with aspirations every 5 mL.  Performed by: Personally  Anesthesiologist: Dr Chaney Malling  Additional Notes: Functioning IV was confirmed and monitors were applied.  A 90mm 21ga Arrow echogenic stimulator needle was used. Sterile prep and drape,hand hygiene and sterile gloves were used.  Negative aspiration and negative test dose prior to incremental administration of local anesthetic. The patient tolerated the procedure well.    Femoral nerve block

## 2012-04-21 ENCOUNTER — Encounter (HOSPITAL_COMMUNITY): Payer: Self-pay | Admitting: General Practice

## 2012-04-21 LAB — BASIC METABOLIC PANEL
CO2: 25 mEq/L (ref 19–32)
Calcium: 8.2 mg/dL — ABNORMAL LOW (ref 8.4–10.5)
Chloride: 101 mEq/L (ref 96–112)
Creatinine, Ser: 0.77 mg/dL (ref 0.50–1.35)
Glucose, Bld: 157 mg/dL — ABNORMAL HIGH (ref 70–99)

## 2012-04-21 LAB — CBC
HCT: 29.3 % — ABNORMAL LOW (ref 39.0–52.0)
Hemoglobin: 10 g/dL — ABNORMAL LOW (ref 13.0–17.0)
MCH: 27.9 pg (ref 26.0–34.0)
MCV: 81.6 fL (ref 78.0–100.0)
Platelets: 156 10*3/uL (ref 150–400)
RBC: 3.59 MIL/uL — ABNORMAL LOW (ref 4.22–5.81)
WBC: 10.5 10*3/uL (ref 4.0–10.5)

## 2012-04-21 NOTE — Progress Notes (Signed)
Physical Therapy Treatment Patient Details Name: Connery Shiffler. MRN: [id number] DOB: 08-02-1965 Today's Date: 04/21/2012 Time: 1478-2956 PT Time Calculation (min): 23 min  PT Assessment / Plan / Recommendation Comments on Treatment Session  Able to increase hallway ambulation.  Didn't sleep well.  Slightly "jittery"  Will increase gait and improve gait pattern this afternoon.    Follow Up Recommendations  Home health PT     Does the patient have the potential to tolerate intense rehabilitation     Barriers to Discharge        Equipment Recommendations  None recommended by PT    Recommendations for Other Services    Frequency 7X/week   Plan Discharge plan remains appropriate    Precautions / Restrictions Precautions Precautions: Knee Restrictions Weight Bearing Restrictions: Yes RLE Weight Bearing: Weight bearing as tolerated   Pertinent Vitals/Pain 8/10 R knee pain. Premedicated     Mobility  Transfers Transfers: Stand to Sit Stand to Sit: 4: Min assist;With upper extremity assist Ambulation/Gait Ambulation/Gait Assistance: 4: Min assist Ambulation Distance (Feet): 60 Feet Assistive device: Rolling walker Ambulation/Gait Assistance Details: Cues for safety.  Weight on UE. Gait Pattern: Step-to pattern;Decreased step length - left;Decreased step length - right Gait velocity: Decreased Stairs: No    Exercises Total Joint Exercises Quad Sets: AROM;Right;10 reps Short Arc Quad: AROM;Right;10 reps Heel Slides: AROM;Right;10 reps Straight Leg Raises: AAROM;Right;10 reps   PT Diagnosis:    PT Problem List:   PT Treatment Interventions:     PT Goals Acute Rehab PT Goals PT Goal: Ambulate - Progress: Progressing toward goal  Visit Information  Last PT Received On: 04/21/12 Assistance Needed: +1    Subjective Data      Cognition  Cognition Overall Cognitive Status: Appears within functional limits for tasks assessed/performed Arousal/Alertness:  Awake/alert Orientation Level: Appears intact for tasks assessed Behavior During Session: [facility] for tasks performed    Balance     End of Session PT - End of Session Equipment Utilized During Treatment: Gait belt Activity Tolerance: Patient tolerated treatment well Patient left: in bed;with call bell/phone within reach   GP     Fredrich Birks 04/21/2012, 9:31 AM 04/21/2012 Fredrich Birks PTA [phone] pager [phone] office

## 2012-04-21 NOTE — Progress Notes (Signed)
Physical Therapy Treatment Patient Details Name: [name]. MRN: [id number] DOB: 08-Jul-1965 Today's Date: 04/21/2012 Time: 4098-1191 PT Time Calculation (min): 21 min  PT Assessment / Plan / Recommendation Comments on Treatment Session  Increased hallway ambulation.  Gait improved with adjustment to rolling walker.  "Jitters" have decreased.  Improved TE, increased ROM with straight leg raise.      Follow Up Recommendations  Home health PT     Does the patient have the potential to tolerate intense rehabilitation     Barriers to Discharge        Equipment Recommendations  None recommended by PT    Recommendations for Other Services    Frequency 7X/week   Plan Discharge plan remains appropriate    Precautions / Restrictions Precautions Precautions: Knee Restrictions Weight Bearing Restrictions: Yes RLE Weight Bearing: Weight bearing as tolerated   Pertinent Vitals/Pain 8/10    Mobility  Transfers Transfers: Stand to Sit Stand to Sit: 4: Min guard;With upper extremity assist;To bed Details for Transfer Assistance: Cues for hand placement.  Moved LE to bed himself.  Ambulation/Gait Ambulation/Gait Assistance: 4: Min guard Ambulation Distance (Feet): 150 Feet Assistive device: Rolling walker Ambulation/Gait Assistance Details: Cues for posture and stance time on right.  Cues for putting weight through LE. Gait Pattern: Step-to pattern;Decreased step length - left;Decreased step length - right Gait velocity: Decreased. Stairs: No    Exercises Total Joint Exercises Quad Sets: AROM;Right;10 reps Short Arc Quad: AROM;Right;10 reps Heel Slides: AROM;Right;10 reps Straight Leg Raises: AROM;Right;10 reps   PT Diagnosis:    PT Problem List:   PT Treatment Interventions:     PT Goals Acute Rehab PT Goals PT Goal: Ambulate - Progress: Progressing toward goal PT Goal: Perform Home Exercise Program - Progress: Progressing toward goal  Visit Information  Last PT  Received On: 04/21/12 Assistance Needed: +1    Subjective Data      Cognition  Cognition Overall Cognitive Status: Appears within functional limits for tasks assessed/performed Arousal/Alertness: Awake/alert Orientation Level: Appears intact for tasks assessed Behavior During Session: [facility] for tasks performed    Balance     End of Session PT - End of Session Equipment Utilized During Treatment: Gait belt Activity Tolerance: Patient tolerated treatment well Patient left: in bed;with call bell/phone within reach   GP     [facility], Henri Baumler JEAN 04/21/2012, 3:03 PM

## 2012-04-21 NOTE — Progress Notes (Signed)
Patient ID: Javier Land., male   DOB: [birth year], 47 y.o.   MRN: [id number] Patient is postoperative day 1 right total knee arthroplasty. Patient complains of numbness in his foot. Examination, Ace wrap has been placed over his knee during the nighttime for a compression dressing however the Ace wrap is extremely tight. I will have this removed a Mepilex dressing will be applied this morning. Blood work pending this morning. 2 view radiographs of the right knee shows stable alignment.

## 2012-04-21 NOTE — Progress Notes (Addendum)
OT Cancellation Note  Patient Details Name: [name]. MRN: [id number] DOB: 06-30-1965   Cancelled Treatment:    Reason Eval/Treat Not Completed: Other (comment) (screen) Order received, reviewed chart and spoke with pt. No acute OT needs identified. Will sign off.     Earlie Raveling OTR/L [phone]  04/21/2012, 1:42 PM

## 2012-04-22 ENCOUNTER — Encounter (HOSPITAL_COMMUNITY): Payer: Self-pay | Admitting: Orthopedic Surgery

## 2012-04-22 LAB — CBC
HCT: 24.9 % — ABNORMAL LOW (ref 39.0–52.0)
MCH: 27.5 pg (ref 26.0–34.0)
MCV: 81.6 fL (ref 78.0–100.0)
Platelets: 149 10*3/uL — ABNORMAL LOW (ref 150–400)
RDW: 15.7 % — ABNORMAL HIGH (ref 11.5–15.5)
WBC: 9.7 10*3/uL (ref 4.0–10.5)

## 2012-04-22 MED ORDER — ASPIRIN EC 81 MG PO TBEC: 81.0000 mg | DELAYED_RELEASE_TABLET | Freq: Every day | ORAL | Status: DC

## 2012-04-22 MED ORDER — OXYCODONE-ACETAMINOPHEN 5-325 MG PO TABS: 1.0000 | ORAL_TABLET | ORAL | Status: DC | PRN

## 2012-04-22 NOTE — Discharge Summary (Signed)
Physician Discharge Summary  Patient ID: [name]. MRN: [id number] DOB/AGE: 05/09/1965 47 y.o.  Admit date: 04/20/2012 Discharge date: 04/22/2012  Admission Diagnoses: Osteoarthritis right knee Discharge Diagnoses: Osteoarthritis right knee Active Problems:   * No active hospital problems. *   Discharged Condition: stable  Hospital Course: Patient's hospital course was essentially unremarkable. He underwent right total knee arthroplasty. He progressed well with therapy and was discharged to home in stable condition.  Consults: None  Significant Diagnostic Studies: labs: Routine labs  Treatments: surgery: See operative note  Discharge Exam: Blood pressure 117/68, pulse 107, temperature 98.2 F (36.8 C), temperature source Oral, resp. rate 18, SpO2 98.00%. Incision/Wound: incision clean and dry  Disposition: 01-Home or Self Care  Discharge Orders   Future Orders Complete By Expires     Call MD / Call 911  As directed     Comments:      If you experience chest pain or shortness of breath, CALL 911 and be transported to the hospital emergency room.  If you develope a fever above 101 F, pus (white drainage) or increased drainage or redness at the wound, or calf pain, call your surgeon's office.    Constipation Prevention  As directed     Comments:      Drink plenty of fluids.  Prune juice may be helpful.  You may use a stool softener, such as Colace (over the counter) 100 mg twice a day.  Use MiraLax (over the counter) for constipation as needed.    Diet - low sodium heart healthy  As directed     Increase activity slowly as tolerated  As directed         Medication List    TAKE these medications       aspirin EC 81 MG tablet  Take 1 tablet (81 mg total) by mouth daily.     escitalopram 10 MG tablet  Commonly known as:  LEXAPRO  Take 1 tablet (10 mg total) by mouth daily. For depression     gabapentin 300 MG capsule  Commonly known as:  NEURONTIN  Take 1 capsule  (300 mg total) by mouth 3 (three) times daily. For anxiety/pain control     ibuprofen 200 MG tablet  Commonly known as:  ADVIL,MOTRIN  Take 2 tablets (400 mg total) by mouth every 6 (six) hours as needed for pain.     mirtazapine 30 MG tablet  Commonly known as:  REMERON  Take 1 tablet (30 mg total) by mouth at bedtime. For depression/sleep     oxyCODONE-acetaminophen 5-325 MG per tablet  Commonly known as:  ROXICET  Take 1 tablet by mouth every 4 (four) hours as needed for pain.     QUEtiapine 50 MG tablet  Commonly known as:  SEROQUEL  Take 1 tablet (50 mg total) by mouth at bedtime. For mood control/insomnia           Follow-up Information   Follow up with Javier Gloster V, MD In 2 weeks.   Contact information:   [address] [place] [address] [phone]       Signed: Nadara Mustard 04/22/2012, 6:35 AM

## 2012-04-22 NOTE — Progress Notes (Signed)
Physical Therapy Treatment Patient Details Name: [name]. MRN: [id number] DOB: 10-13-1965 Today's Date: 04/22/2012 Time: 0102-7253 PT Time Calculation (min): 41 min  PT Assessment / Plan / Recommendation Comments on Treatment Session  Will see pt once more this afternoon to review HEP for d/c home today.      Follow Up Recommendations  Home health PT     Does the patient have the potential to tolerate intense rehabilitation     Barriers to Discharge        Equipment Recommendations  None recommended by PT    Recommendations for Other Services    Frequency 7X/week   Plan Discharge plan remains appropriate    Precautions / Restrictions Precautions Precautions: Knee Restrictions Weight Bearing Restrictions: Yes RLE Weight Bearing: Weight bearing as tolerated   Pertinent Vitals/Pain 7/10 pain in knee.  Premedicated.     Mobility  Bed Mobility Bed Mobility: Supine to Sit;Sit to Supine Supine to Sit: 6: Modified independent (Device/Increase time) Sitting - Scoot to Edge of Bed: 6: Modified independent (Device/Increase time) Sit to Supine: 6: Modified independent (Device/Increase time) Transfers Transfers: Stand to Sit;Sit to Stand Sit to Stand: 5: Supervision Stand to Sit: 5: Supervision Ambulation/Gait Ambulation/Gait Assistance: 5: Supervision Ambulation Distance (Feet): 125 Feet Assistive device: Rolling walker Gait Pattern: Step-to pattern;Decreased step length - left;Decreased step length - right Gait velocity: Decreased. Stairs: Yes Stairs Assistance: 5: Supervision Stair Management Technique: Two rails;Step to pattern;Forwards Number of Stairs: 5    Exercises Total Joint Exercises Ankle Circles/Pumps: 10 reps Quad Sets: AROM;Right;10 reps   PT Diagnosis:    PT Problem List:   PT Treatment Interventions:     PT Goals Acute Rehab PT Goals PT Goal Formulation: With patient Time For Goal Achievement: 04/27/12 Potential to Achieve Goals: Good Pt  will go Supine/Side to Sit: with modified independence PT Goal: Supine/Side to Sit - Progress: Progressing toward goal Pt will go Sit to Supine/Side: with modified independence PT Goal: Sit to Supine/Side - Progress: Progressing toward goal Pt will Transfer Bed to Chair/Chair to Bed: with modified independence PT Transfer Goal: Bed to Chair/Chair to Bed - Progress: Progressing toward goal Pt will Ambulate: >150 feet;with modified independence;with rolling walker PT Goal: Ambulate - Progress: Progressing toward goal Pt will Go Up / Down Stairs: 3-5 stairs;with supervision;with least restrictive assistive device PT Goal: Up/Down Stairs - Progress: Progressing toward goal Pt will Perform Home Exercise Program: Independently PT Goal: Perform Home Exercise Program - Progress: Progressing toward goal  Visit Information  Last PT Received On: 04/22/12 Assistance Needed: +1    Subjective Data  Subjective: going home today.   Patient Stated Goal: walk without pain.    Cognition  Cognition Overall Cognitive Status: Appears within functional limits for tasks assessed/performed Arousal/Alertness: Awake/alert Orientation Level: Appears intact for tasks assessed Behavior During Session: [facility] for tasks performed    Balance     End of Session PT - End of Session Equipment Utilized During Treatment: Gait belt Activity Tolerance: Patient tolerated treatment well Patient left: in chair;with call bell/phone within reach Nurse Communication: Mobility status   GP     Sukhdeep Wieting 04/22/2012, 9:53 AM Theron Arista L. Leisl Spurrier DPT [phone]

## 2012-04-22 NOTE — Care Management Note (Signed)
CARE MANAGEMENT NOTE 04/22/2012  Patient:  [name], [name]   Account Number:  [id number]  Date Initiated:  04/22/2012  Documentation initiated by:  Vance Peper  Subjective/Objective Assessment:   47 yr old male s/p left total knee arthroplasty.     Action/Plan:   CM spoke with patient concerning home health and DME needs at discharge. Choice offered. Patient has rolling walker and 3in1, CPM not needed. Has family support at discharge.   Anticipated DC Date:  04/22/2012   Anticipated DC Plan:  HOME W HOME HEALTH SERVICES      DC Planning Services  CM consult      [facility] Choice  HOME HEALTH   Choice offered to / List presented to:  C-1 Patient        HH arranged  HH-2 PT      [facility] agency  [facility]   Status of service:  Completed, signed off Medicare Important Message given?   (If response is "NO", the following Medicare IM given date fields will be blank) Date Medicare IM given:   Date Additional Medicare IM given:    Discharge Disposition:  HOME W HOME HEALTH SERVICES  Per UR Regulation:    If discussed at Long Length of Stay Meetings, dates discussed:    Comments:

## 2012-04-22 NOTE — Progress Notes (Signed)
Physical Therapy Treatment Patient Details Name: [name]. MRN: [id number] DOB: May 06, 1965 Today's Date: 04/22/2012 Time: 1308-6578 PT Time Calculation (min): 12 min  PT Assessment / Plan / Recommendation Comments on Treatment Session  All acute PT goals met.     Follow Up Recommendations  Home health PT     Does the patient have the potential to tolerate intense rehabilitation     Barriers to Discharge        Equipment Recommendations  Rolling walker with 5" wheels    Recommendations for Other Services    Frequency 7X/week   Plan Discharge plan remains appropriate;All goals met and education completed, patient dischaged from PT services    Precautions / Restrictions Precautions Precautions: Knee Restrictions Weight Bearing Restrictions: Yes RLE Weight Bearing: Weight bearing as tolerated   Pertinent Vitals/Pain 8/10 pain in knee. Pt premedicated.      Mobility  Bed Mobility Bed Mobility: Not assessed Transfers Transfers: Stand to Sit;Sit to Stand Sit to Stand: 6: Modified independent (Device/Increase time) Stand to Sit: 6: Modified independent (Device/Increase time) Stand Pivot Transfers: 6: Modified independent (Device/Increase time) Ambulation/Gait Ambulation/Gait Assistance: 6: Modified independent (Device/Increase time) Ambulation Distance (Feet): 125 Feet Assistive device: Rolling walker Gait Pattern: Step-to pattern;Decreased step length - left;Decreased step length - right Stairs: No    Exercises Total Joint Exercises Ankle Circles/Pumps: 10 reps Quad Sets: AROM;Right;10 reps Short Arc Quad: AROM;Right;10 reps Heel Slides: AROM;Right;10 reps Straight Leg Raises: AROM;Right;10 reps   PT Diagnosis:    PT Problem List:   PT Treatment Interventions:     PT Goals Acute Rehab PT Goals PT Goal Formulation: With patient Time For Goal Achievement: 04/27/12 Potential to Achieve Goals: Good Pt will go Supine/Side to Sit: with modified  independence PT Goal: Supine/Side to Sit - Progress: Met Pt will go Sit to Supine/Side: with modified independence PT Goal: Sit to Supine/Side - Progress: Met Pt will Transfer Bed to Chair/Chair to Bed: with modified independence PT Transfer Goal: Bed to Chair/Chair to Bed - Progress: Met Pt will Ambulate: >150 feet;with modified independence;with rolling walker PT Goal: Ambulate - Progress: Met Pt will Go Up / Down Stairs: 3-5 stairs;with supervision;with least restrictive assistive device PT Goal: Up/Down Stairs - Progress: Met Pt will Perform Home Exercise Program: Independently PT Goal: Perform Home Exercise Program - Progress: Met  Visit Information  Last PT Received On: 04/22/12    Subjective Data      Cognition  Cognition Overall Cognitive Status: Appears within functional limits for tasks assessed/performed Arousal/Alertness: Awake/alert Orientation Level: Appears intact for tasks assessed Behavior During Session: [facility] for tasks performed    Balance     End of Session PT - End of Session Equipment Utilized During Treatment: Gait belt Activity Tolerance: Patient tolerated treatment well Patient left: in chair;with call bell/phone within reach Nurse Communication: Mobility status   GP     Masami Plata 04/22/2012, 3:39 PM Gaetana Kawahara L. Shelita Steptoe DPT [phone]

## 2012-04-22 NOTE — Progress Notes (Signed)
Patient ID: [name]., male   DOB: 1966-01-13, 47 y.o.   MRN: [id number] Tentatively plan for discharge to home today. If  not safe with therapy plan for discharge to home on Saturday.

## 2012-04-23 NOTE — Progress Notes (Signed)
Seen and agreed 04/23/2012 Fredrich Birks PTA [phone] pager [phone] office

## 2012-06-08 ENCOUNTER — Emergency Department (HOSPITAL_COMMUNITY)
Admission: EM | Admit: 2012-06-08 | Discharge: 2012-06-08 | Disposition: A | Discharge status: Home / Self Care | Payer: Medicare Other | Attending: Emergency Medicine | Admitting: Emergency Medicine

## 2012-06-08 ENCOUNTER — Encounter (HOSPITAL_COMMUNITY): Payer: Self-pay

## 2012-06-08 ENCOUNTER — Emergency Department (HOSPITAL_COMMUNITY): Payer: Medicare Other

## 2012-06-08 DIAGNOSIS — S8990XA Unspecified injury of unspecified lower leg, initial encounter: Secondary | ICD-10-CM | POA: Insufficient documentation

## 2012-06-08 DIAGNOSIS — L97509 Non-pressure chronic ulcer of other part of unspecified foot with unspecified severity: Secondary | ICD-10-CM | POA: Insufficient documentation

## 2012-06-08 DIAGNOSIS — Z8719 Personal history of other diseases of the digestive system: Secondary | ICD-10-CM | POA: Insufficient documentation

## 2012-06-08 DIAGNOSIS — Z96659 Presence of unspecified artificial knee joint: Secondary | ICD-10-CM | POA: Insufficient documentation

## 2012-06-08 DIAGNOSIS — F17200 Nicotine dependence, unspecified, uncomplicated: Secondary | ICD-10-CM | POA: Insufficient documentation

## 2012-06-08 DIAGNOSIS — Y929 Unspecified place or not applicable: Secondary | ICD-10-CM | POA: Insufficient documentation

## 2012-06-08 DIAGNOSIS — Y9389 Activity, other specified: Secondary | ICD-10-CM | POA: Insufficient documentation

## 2012-06-08 DIAGNOSIS — Z96649 Presence of unspecified artificial hip joint: Secondary | ICD-10-CM | POA: Insufficient documentation

## 2012-06-08 DIAGNOSIS — Z8701 Personal history of pneumonia (recurrent): Secondary | ICD-10-CM | POA: Insufficient documentation

## 2012-06-08 DIAGNOSIS — S99919A Unspecified injury of unspecified ankle, initial encounter: Secondary | ICD-10-CM | POA: Insufficient documentation

## 2012-06-08 DIAGNOSIS — R296 Repeated falls: Secondary | ICD-10-CM | POA: Insufficient documentation

## 2012-06-08 DIAGNOSIS — Z8673 Personal history of transient ischemic attack (TIA), and cerebral infarction without residual deficits: Secondary | ICD-10-CM | POA: Insufficient documentation

## 2012-06-08 DIAGNOSIS — E1169 Type 2 diabetes mellitus with other specified complication: Secondary | ICD-10-CM | POA: Insufficient documentation

## 2012-06-08 DIAGNOSIS — M129 Arthropathy, unspecified: Secondary | ICD-10-CM | POA: Insufficient documentation

## 2012-06-08 DIAGNOSIS — Z8669 Personal history of other diseases of the nervous system and sense organs: Secondary | ICD-10-CM | POA: Insufficient documentation

## 2012-06-08 DIAGNOSIS — M25561 Pain in right knee: Secondary | ICD-10-CM

## 2012-06-08 DIAGNOSIS — Z8659 Personal history of other mental and behavioral disorders: Secondary | ICD-10-CM | POA: Insufficient documentation

## 2012-06-08 MED ORDER — OXYCODONE-ACETAMINOPHEN 5-325 MG PO TABS
2.0000 | ORAL_TABLET | Freq: Once | ORAL | Status: AC
  Administered 2012-06-08: 2 via ORAL
  Filled 2012-06-08: qty 2

## 2012-06-08 MED ORDER — OXYCODONE-ACETAMINOPHEN 5-325 MG PO TABS: 2.0000 | ORAL_TABLET | Freq: Four times a day (QID) | ORAL | Status: DC | PRN

## 2012-06-08 NOTE — ED Notes (Signed)
Pt states he had his right knee replaced two months ago. States his knee gave out and he fell

## 2012-06-09 NOTE — ED Provider Notes (Signed)
History     CSN: [id number]  Arrival date & time 06/08/12  1723   First MD Initiated Contact with Patient 06/08/12 1752      Chief Complaint  Patient presents with  . Knee Injury    (Consider location/radiation/quality/duration/timing/severity/associated sxs/prior treatment) HPI  Past Medical History  Diagnosis Date  . Diabetic foot ulcer   . ETOH abuse   . Anxiety   . Open wound     bottom of foot  . Diabetes mellitus without complication     borderline  . Neuromuscular disorder     neuropathy  . GERD (gastroesophageal reflux disease)     tums  . Pneumonia ~ 2012  . History of blood transfusion     "related to left knee OR; probably right hip too" (04/21/2012)  . Stroke 2008    "they said I might have had one during right hip replacement" (04/21/2012)  . Arthritis     "everywhere" (04/21/2012)  . Mental disorder   . Depression     Past Surgical History  Procedure Laterality Date  . Total hip arthroplasty Right 2008  . Total knee arthroplasty Left 2006  . Joint replacement    . Lung lobectomy Left ~ 2006  . Revision total hip arthroplasty Right 2008    "4-5 months after replacement" (04/21/2012)  . Knee arthroscopy Bilateral 1980's/1990's  . Lung lobectomy    . Metatarsal osteotomy  10/29/2011    Procedure: METATARSAL OSTEOTOMY;  Surgeon: Nadara Mustard, MD;  Location: [facility];  Service: Orthopedics;  Laterality: Left;  Left 1st Metatarsal Dorsal Closing Wedge   . Total knee arthroplasty Right 04/20/2012  . Total knee arthroplasty Right 04/20/2012    Procedure: TOTAL KNEE ARTHROPLASTY;  Surgeon: Nadara Mustard, MD;  Location: [facility] OR;  Service: Orthopedics;  Laterality: Right;  Right Total Knee Arthroplasty    History reviewed. No pertinent family history.  History  Substance Use Topics  . Smoking status: Current Every Day Smoker -- 1.50 packs/day for 30 years    Types: Cigarettes  . Smokeless tobacco: Never Used     Comment: 04/21/2012 offered smoking cessation  materials; pt declines  . Alcohol Use: 0.0 oz/week     Comment: 04/21/2012 "last alcohol ~ 1 1/2 months ago; hx. alcohol abuse in past"      Review of Systems  Allergies  Benadryl and Trazodone and nefazodone  Home Medications   Current Outpatient Rx  Name  Route  Sig  Dispense  Refill  . ibuprofen (ADVIL,MOTRIN) 200 MG tablet   Oral   Take 2 tablets (400 mg total) by mouth every 6 (six) hours as needed for pain.   30 tablet      . oxyCODONE-acetaminophen (PERCOCET/ROXICET) 5-325 MG per tablet   Oral   Take 2 tablets by mouth every 6 (six) hours as needed for pain.   20 tablet   0     BP 150/82  Pulse 96  Temp(Src) 98.7 F (37.1 C) (Oral)  Resp 18  Ht 6\' 3"  (1.905 m)  Wt 190 lb (86.183 kg)  BMI 23.75 kg/m2  SpO2 100%  Physical Exam  ED Course  Procedures (including critical care time)  Labs Reviewed - No data to display Dg Knee Complete 4 Views Right  06/08/2012   *RADIOLOGY REPORT*  Clinical Data: Right knee pain post fall, right knee surgery 2 months ago  RIGHT KNEE - COMPLETE 4+ VIEW  Comparison: 04/20/2012  Findings: Components of a right knee prosthesis are  identified in expected positions. Bones appear diffusely demineralized. No acute fracture, dislocation or bone destruction. No periprosthetic lucency. Regional soft tissue swelling identified. Small knee joint effusion.  IMPRESSION: Right knee prosthesis. Regional soft tissue swelling and small joint effusion. No acute abnormalities.   Original Report Authenticated By: Ulyses Southward, M.D.     No diagnosis found.    MDM  Chart belongs to [facility]        Donnetta Hutching, MD 06/09/12 1536

## 2012-06-10 NOTE — ED Provider Notes (Signed)
History     CSN: [id number]  Arrival date & time 06/08/12  1723   First MD Initiated Contact with Patient 06/08/12 1752      Chief Complaint  Patient presents with  . Knee Injury    (Consider location/radiation/quality/duration/timing/severity/associated sxs/prior treatment) HPI Comments: Javier Duva. is a 47 y.o. male who presents to the Emergency Department complaining of right knee pain.  States he is two months post-op from a total knee replacement.  States that he was reaching up to trim a tree branch and knee "twisted and gave away".  States he has had pain and some increased swelling ot the knee since the accident.  He denies pain or swelling ot the lower leg, numbness or redness.   Patient is a 47 y.o. male presenting with knee pain. The history is provided by the patient.  Knee Pain Location:  Knee Injury: yes   Mechanism of injury: fall   Fall:    Fall occurred:  Standing   Impact surface:  Designer, fashion/clothing of impact:  Unable to specify   Entrapped after fall: no   Knee location:  R knee Pain details:    Quality:  Aching and throbbing   Radiates to:  Does not radiate   Severity:  Moderate   Onset quality:  Sudden   Timing:  Constant   Progression:  Unchanged Chronicity:  Recurrent Dislocation: no   Foreign body present:  No foreign bodies Prior injury to area:  Yes (hx of total knee replacement two months prior.  ) Worsened by:  Bearing weight Ineffective treatments:  None tried Associated symptoms: swelling   Associated symptoms: no decreased ROM, no fever, no muscle weakness, no neck pain, no numbness, no stiffness and no tingling     Past Medical History  Diagnosis Date  . Diabetic foot ulcer   . ETOH abuse   . Anxiety   . Open wound     bottom of foot  . Diabetes mellitus without complication     borderline  . Neuromuscular disorder     neuropathy  . GERD (gastroesophageal reflux disease)     tums  . Pneumonia ~ 2012  . History of blood  transfusion     "related to left knee OR; probably right hip too" (04/21/2012)  . Stroke 2008    "they said I might have had one during right hip replacement" (04/21/2012)  . Arthritis     "everywhere" (04/21/2012)  . Mental disorder   . Depression     Past Surgical History  Procedure Laterality Date  . Total hip arthroplasty Right 2008  . Total knee arthroplasty Left 2006  . Joint replacement    . Lung lobectomy Left ~ 2006  . Revision total hip arthroplasty Right 2008    "4-5 months after replacement" (04/21/2012)  . Knee arthroscopy Bilateral 1980's/1990's  . Lung lobectomy    . Metatarsal osteotomy  10/29/2011    Procedure: METATARSAL OSTEOTOMY;  Surgeon: Nadara Mustard, MD;  Location: [facility] OR;  Service: Orthopedics;  Laterality: Left;  Left 1st Metatarsal Dorsal Closing Wedge   . Total knee arthroplasty Right 04/20/2012  . Total knee arthroplasty Right 04/20/2012    Procedure: TOTAL KNEE ARTHROPLASTY;  Surgeon: Nadara Mustard, MD;  Location: [facility] OR;  Service: Orthopedics;  Laterality: Right;  Right Total Knee Arthroplasty    History reviewed. No pertinent family history.  History  Substance Use Topics  . Smoking status: Current Every Day Smoker --  1.50 packs/day for 30 years    Types: Cigarettes  . Smokeless tobacco: Never Used     Comment: 04/21/2012 offered smoking cessation materials; pt declines  . Alcohol Use: 0.0 oz/week     Comment: 04/21/2012 "last alcohol ~ 1 1/2 months ago; hx. alcohol abuse in past"      Review of Systems  Constitutional: Negative for fever and chills.  HENT: Negative for neck pain.   Genitourinary: Negative for dysuria and difficulty urinating.  Musculoskeletal: Positive for joint swelling and arthralgias. Negative for stiffness.  Skin: Negative for color change and wound.  All other systems reviewed and are negative.    Allergies  Benadryl and Trazodone and nefazodone  Home Medications   Current Outpatient Rx  Name  Route  Sig  Dispense  Refill   . ibuprofen (ADVIL,MOTRIN) 200 MG tablet   Oral   Take 2 tablets (400 mg total) by mouth every 6 (six) hours as needed for pain.   30 tablet      . oxyCODONE-acetaminophen (PERCOCET/ROXICET) 5-325 MG per tablet   Oral   Take 2 tablets by mouth every 6 (six) hours as needed for pain.   20 tablet   0     BP 150/82  Pulse 96  Temp(Src) 98.7 F (37.1 C) (Oral)  Resp 18  Ht 6\' 3"  (1.905 m)  Wt 190 lb (86.183 kg)  BMI 23.75 kg/m2  SpO2 100%  Physical Exam  Nursing note and vitals reviewed. Constitutional: He is oriented to person, place, and time. He appears well-developed and well-nourished. No distress.  HENT:  Head: Normocephalic and atraumatic.  Cardiovascular: Normal rate, regular rhythm, normal heart sounds and intact distal pulses.   Pulmonary/Chest: Effort normal and breath sounds normal. No respiratory distress.  Musculoskeletal: He exhibits edema and tenderness.  ttp of the right anterior knee.  No erythema, bruising or bony deformity.  Well healing surgical scar to the right knee.  DP pulse is brisk, distal sensation intact.    Neurological: He is alert and oriented to person, place, and time. He exhibits normal muscle tone. Coordination normal.  Skin: Skin is warm and dry. No erythema.    ED Course  Procedures (including critical care time)  Labs Reviewed - No data to display No results found.   Dg Knee Complete 4 Views Right  06/08/2012   *RADIOLOGY REPORT*  Clinical Data: Right knee pain post fall, right knee surgery 2 months ago  RIGHT KNEE - COMPLETE 4+ VIEW  Comparison: 04/20/2012  Findings: Components of a right knee prosthesis are identified in expected positions. Bones appear diffusely demineralized. No acute fracture, dislocation or bone destruction. No periprosthetic lucency. Regional soft tissue swelling identified. Small knee joint effusion.  IMPRESSION: Right knee prosthesis. Regional soft tissue swelling and small joint effusion. No acute  abnormalities.   Original Report Authenticated By: Ulyses Southward, M.D.       MDM    Previous ED charts reviewed.  Patient has appt next month with his orthopedic physician.  No acute injury seen on x-ray.  Mild effusion to the knee.  No erythema or excessive warmth.  Distal sensation intact.  No distal tenderness.    Pt agrees to elevate and apply ice and close f/u with orthopedics.  The patient appears reasonably screened and/or stabilized for discharge and I doubt any other medical condition or other [facility] requiring further screening, evaluation, or treatment in the ED at this time prior to discharge.       Suhani Stillion  Merlene Morse, PA-C 06/10/12 2036

## 2012-06-13 NOTE — ED Provider Notes (Signed)
Medical screening examination/treatment/procedure(s) were performed by non-physician practitioner and as supervising physician I was immediately available for consultation/collaboration.  Donnetta Hutching, MD 06/13/12 [phone]

## 2012-07-12 ENCOUNTER — Emergency Department (HOSPITAL_COMMUNITY): Payer: Medicare Other

## 2012-07-12 ENCOUNTER — Encounter (HOSPITAL_COMMUNITY): Payer: Self-pay | Admitting: *Deleted

## 2012-07-12 ENCOUNTER — Emergency Department (HOSPITAL_COMMUNITY)
Admission: EM | Admit: 2012-07-12 | Discharge: 2012-07-12 | Disposition: A | Discharge status: Home / Self Care | Payer: Medicare Other | Attending: Emergency Medicine | Admitting: Emergency Medicine

## 2012-07-12 DIAGNOSIS — M25469 Effusion, unspecified knee: Secondary | ICD-10-CM | POA: Insufficient documentation

## 2012-07-12 DIAGNOSIS — Z87828 Personal history of other (healed) physical injury and trauma: Secondary | ICD-10-CM | POA: Insufficient documentation

## 2012-07-12 DIAGNOSIS — W010XXA Fall on same level from slipping, tripping and stumbling without subsequent striking against object, initial encounter: Secondary | ICD-10-CM | POA: Insufficient documentation

## 2012-07-12 DIAGNOSIS — M25461 Effusion, right knee: Secondary | ICD-10-CM

## 2012-07-12 DIAGNOSIS — Y92009 Unspecified place in unspecified non-institutional (private) residence as the place of occurrence of the external cause: Secondary | ICD-10-CM | POA: Insufficient documentation

## 2012-07-12 DIAGNOSIS — F17200 Nicotine dependence, unspecified, uncomplicated: Secondary | ICD-10-CM | POA: Insufficient documentation

## 2012-07-12 DIAGNOSIS — Z8669 Personal history of other diseases of the nervous system and sense organs: Secondary | ICD-10-CM | POA: Insufficient documentation

## 2012-07-12 DIAGNOSIS — E1169 Type 2 diabetes mellitus with other specified complication: Secondary | ICD-10-CM | POA: Insufficient documentation

## 2012-07-12 DIAGNOSIS — S79919A Unspecified injury of unspecified hip, initial encounter: Secondary | ICD-10-CM | POA: Insufficient documentation

## 2012-07-12 DIAGNOSIS — Z8701 Personal history of pneumonia (recurrent): Secondary | ICD-10-CM | POA: Insufficient documentation

## 2012-07-12 DIAGNOSIS — M25552 Pain in left hip: Secondary | ICD-10-CM

## 2012-07-12 DIAGNOSIS — Y9389 Activity, other specified: Secondary | ICD-10-CM | POA: Insufficient documentation

## 2012-07-12 DIAGNOSIS — S8990XA Unspecified injury of unspecified lower leg, initial encounter: Secondary | ICD-10-CM | POA: Insufficient documentation

## 2012-07-12 DIAGNOSIS — Z8739 Personal history of other diseases of the musculoskeletal system and connective tissue: Secondary | ICD-10-CM | POA: Insufficient documentation

## 2012-07-12 DIAGNOSIS — S99919A Unspecified injury of unspecified ankle, initial encounter: Secondary | ICD-10-CM | POA: Insufficient documentation

## 2012-07-12 DIAGNOSIS — Z8719 Personal history of other diseases of the digestive system: Secondary | ICD-10-CM | POA: Insufficient documentation

## 2012-07-12 DIAGNOSIS — Z8673 Personal history of transient ischemic attack (TIA), and cerebral infarction without residual deficits: Secondary | ICD-10-CM | POA: Insufficient documentation

## 2012-07-12 DIAGNOSIS — Z8659 Personal history of other mental and behavioral disorders: Secondary | ICD-10-CM | POA: Insufficient documentation

## 2012-07-12 DIAGNOSIS — M25559 Pain in unspecified hip: Secondary | ICD-10-CM | POA: Insufficient documentation

## 2012-07-12 DIAGNOSIS — L97509 Non-pressure chronic ulcer of other part of unspecified foot with unspecified severity: Secondary | ICD-10-CM | POA: Insufficient documentation

## 2012-07-12 DIAGNOSIS — Z96659 Presence of unspecified artificial knee joint: Secondary | ICD-10-CM | POA: Insufficient documentation

## 2012-07-12 MED ORDER — OXYCODONE-ACETAMINOPHEN 5-325 MG PO TABS: 2.0000 | ORAL_TABLET | ORAL | Status: DC | PRN

## 2012-07-12 MED ORDER — OXYCODONE HCL 5 MG PO TABS: 5.0000 mg | ORAL_TABLET | ORAL | Status: DC | PRN

## 2012-07-12 MED ORDER — OXYCODONE-ACETAMINOPHEN 5-325 MG PO TABS
2.0000 | ORAL_TABLET | Freq: Once | ORAL | Status: AC
  Administered 2012-07-12: 2 via ORAL
  Filled 2012-07-12: qty 2

## 2012-07-12 MED ORDER — OXYCODONE-ACETAMINOPHEN 5-325 MG PO TABS: 1.0000 | ORAL_TABLET | ORAL | Status: DC | PRN

## 2012-07-12 NOTE — ED Provider Notes (Signed)
[name]. is a 47 y.o. male evaluated by Dr. Ethelda Chick in the ED tonight. The patent had x-rays of his left hip and right knee that were negative. Due to the severe pain the patient exhibited with internal rotation of his hip when Dr. Ethelda Chick examined him a CT of the hip was ordered.   This is a shared visit with Dr. Ethelda Chick.   Dg Hip Complete Left  07/12/2012   *RADIOLOGY REPORT*  Clinical Data: Fall.  Prior right knee replacement.  LEFT HIP - COMPLETE 2+ VIEW  Comparison: None.  Findings: Prior right hip replacement.  Moderate degenerative changes in the left hip.  No fracture, subluxation or dislocation.  IMPRESSION: No acute bony abnormality.   Original Report Authenticated By: Charlett Nose, M.D.   Ct Pelvis Wo Contrast  07/12/2012   *RADIOLOGY REPORT*  Clinical Data: Fall.  Left hip pain.  Rule out fracture  CT PELVIS WITHOUT CONTRAST  Technique:  Multidetector CT imaging of the pelvis was performed following the standard protocol without intravenous contrast.  Comparison: Pelvis 07/12/2012  Findings: Negative for left hip fracture.  No pelvic fracture. There is mild degenerative change in the left hip.  There  has been prior right hip replacement.  IMPRESSION: Negative for acute fracture.   Original Report Authenticated By: Janeece Riggers, M.D.   Dg Knee Complete 4 Views Right  07/12/2012   *RADIOLOGY REPORT*  Clinical Data: Fall, pain.  Prior right knee replacement.  RIGHT KNEE - COMPLETE 4+ VIEW  Comparison: 06/08/2012  Findings: Moderate to large joint effusion.  Prior right knee replacement.  No hardware complicating feature.  No fracture, subluxation or dislocation.  IMPRESSION: A moderate-to-large joint effusion.  Prior right knee replacement. No acute bony findings.   Original Report Authenticated By: Charlett Nose, M.D.    Since the CT is negative for fracture and the patient is able to ambulate we will apply an ace wrap to the right knee for compression of the effusion and aid in  comfort. The patient will elevate the area, apply ice and follow up with his orthopedic doctor [name]) in [place] this week.   Discussed with the patient x-ray and CT findings and plan of care. All questioned fully answered. He will return if any problems arise.   BP 151/89  Pulse 84  Temp(Src) 98.8 F (37.1 C) (Oral)  Resp 18  Ht 6\' 3"  (1.905 m)  Wt 190 lb (86.183 kg)  BMI 23.75 kg/m2  SpO2 100%   BP repeated prior to discharge and was 144/75.  Patient to continue ibuprofen and will give Rx for Percocet.  Patient ambulatory with cane on discharge.   [name], [place] 07/12/12 2249

## 2012-07-12 NOTE — ED Provider Notes (Signed)
History    CSN: [id number] Arrival date & time 07/12/12  2009  None    Chief Complaint  Patient presents with  . Fall   (Consider location/radiation/quality/duration/timing/severity/associated sxs/prior Treatment) HPI Patient fell this afternoon in his home on a hardwood floor when his right knee "gave out on me". As result of the fall he complains of right knee pain and left hip pain. He's been ambulatory since the event. Treated with Motrin, without relief. Pain is worse with movement. Improved with remaining still. Pain is moderate to severe present. His right knee swelled since falling today. No other injury no other associated complaint Past Medical History  Diagnosis Date  . Diabetic foot ulcer   . ETOH abuse   . Anxiety   . Open wound     bottom of foot  . Diabetes mellitus without complication     borderline  . Neuromuscular disorder     neuropathy  . GERD (gastroesophageal reflux disease)     tums  . Pneumonia ~ 2012  . History of blood transfusion     "related to left knee OR; probably right hip too" (04/21/2012)  . Stroke 2008    "they said I might have had one during right hip replacement" (04/21/2012)  . Arthritis     "everywhere" (04/21/2012)  . Mental disorder   . Depression    Past Surgical History  Procedure Laterality Date  . Total hip arthroplasty Right 2008  . Total knee arthroplasty Left 2006  . Joint replacement    . Lung lobectomy Left ~ 2006  . Revision total hip arthroplasty Right 2008    "4-5 months after replacement" (04/21/2012)  . Knee arthroscopy Bilateral 1980's/1990's  . Lung lobectomy    . Metatarsal osteotomy  10/29/2011    Procedure: METATARSAL OSTEOTOMY;  Surgeon: Nadara Mustard, MD;  Location: [facility] OR;  Service: Orthopedics;  Laterality: Left;  Left 1st Metatarsal Dorsal Closing Wedge   . Total knee arthroplasty Right 04/20/2012  . Total knee arthroplasty Right 04/20/2012    Procedure: TOTAL KNEE ARTHROPLASTY;  Surgeon: Nadara Mustard, MD;   Location: [facility] OR;  Service: Orthopedics;  Laterality: Right;  Right Total Knee Arthroplasty   No family history on file. History  Substance Use Topics  . Smoking status: Current Every Day Smoker -- 1.50 packs/day for 30 years    Types: Cigarettes  . Smokeless tobacco: Never Used     Comment: 04/21/2012 offered smoking cessation materials; pt declines  . Alcohol Use: 0.0 oz/week     Comment: 04/21/2012 "last alcohol ~ 1 1/2 months ago; hx. alcohol abuse in past"    Review of Systems  Constitutional: Negative.   HENT: Negative.   Gastrointestinal: Negative.   Musculoskeletal: Positive for arthralgias.  Skin: Negative.   Allergic/Immunologic: Negative.   Neurological: Negative.   Psychiatric/Behavioral: Negative.   All other systems reviewed and are negative.    Allergies  Benadryl and Trazodone and nefazodone  Home Medications   Current Outpatient Rx  Name  Route  Sig  Dispense  Refill  . ibuprofen (ADVIL,MOTRIN) 200 MG tablet   Oral   Take 2 tablets (400 mg total) by mouth every 6 (six) hours as needed for pain.   30 tablet      . oxyCODONE-acetaminophen (PERCOCET/ROXICET) 5-325 MG per tablet   Oral   Take 2 tablets by mouth every 6 (six) hours as needed for pain.   20 tablet   0    BP 151/89  Pulse 84  Temp(Src) 98.8 F (37.1 C) (Oral)  Resp 18  Ht 6\' 3"  (1.905 m)  Wt 190 lb (86.183 kg)  BMI 23.75 kg/m2  SpO2 100% Physical Exam  Nursing note and vitals reviewed. Constitutional: He appears well-developed and well-nourished.  HENT:  Head: Normocephalic and atraumatic.  Right Ear: External ear normal.  Left Ear: External ear normal.  Eyes: Conjunctivae are normal. Pupils are equal, round, and reactive to light.  Neck: Neck supple. No tracheal deviation present. No thyromegaly present.  Cardiovascular: Normal rate and regular rhythm.   No murmur heard. Pulmonary/Chest: Effort normal and breath sounds normal.  Abdominal: Soft. Bowel sounds are normal. He  exhibits no distension. There is no tenderness.  Musculoskeletal: Normal range of motion. He exhibits no edema and no tenderness.  Entire spine nontender pelvis stable. Has pain at left groin on internal rotation of left thigh. lestt lower extremity without deformity. Neurovascular intact.. Right lower extremity well-healed surgical scar over anterior knee. Knee is swollen and tender. DP pulse 2+. Both upper extremities without contusion abrasion or tenderness neurovascular intact  Neurological: He is alert. Coordination normal.  Skin: Skin is warm and dry. No rash noted.  Psychiatric: He has a normal mood and affect.    ED Course  Procedures (including critical care time) Labs Reviewed - No data to display Dg Hip Complete Left  07/12/2012   *RADIOLOGY REPORT*  Clinical Data: Fall.  Prior right knee replacement.  LEFT HIP - COMPLETE 2+ VIEW  Comparison: None.  Findings: Prior right hip replacement.  Moderate degenerative changes in the left hip.  No fracture, subluxation or dislocation.  IMPRESSION: No acute bony abnormality.   Original Report Authenticated By: Charlett Nose, M.D.   Dg Knee Complete 4 Views Right  07/12/2012   *RADIOLOGY REPORT*  Clinical Data: Fall, pain.  Prior right knee replacement.  RIGHT KNEE - COMPLETE 4+ VIEW  Comparison: 06/08/2012  Findings: Moderate to large joint effusion.  Prior right knee replacement.  No hardware complicating feature.  No fracture, subluxation or dislocation.  IMPRESSION: A moderate-to-large joint effusion.  Prior right knee replacement. No acute bony findings.   Original Report Authenticated By: Charlett Nose, M.D.   No diagnosis found. X-rays viewed by me. MDM  CT scan of pelvis to rule out occult fracture ordered and pending.Ms Damian Leavell to check CT result and ambulate patient Diagnosis #1 fall #2 right knee pain with effusion #3 left hip pain  Doug Sou, MD 07/12/12 2233

## 2012-07-12 NOTE — ED Notes (Signed)
States he had a right knee replacement 4 months ago and today he slipped and fell on his left hip in his kitchen, pain in hip and knee

## 2012-07-14 NOTE — ED Provider Notes (Signed)
Medical screening examination/treatment/procedure(s) were conducted as a shared visit with non-physician practitioner(s) and myself.  I personally evaluated the patient during the encounter  Doug Sou, MD 07/14/12 [phone]

## 2012-07-18 MED FILL — Oxycodone w/ Acetaminophen Tab 5-325 MG: ORAL | Qty: 6 | Status: AC

## 2012-07-30 IMAGING — CR DG FOOT COMPLETE 3+V*L*
3 series · 3 of 3 positions shown · non-contrast
Comparison: None

CLINICAL DATA: Wound at plantar aspect of left foot proximal to
great toe, left foot swelling, no known injury

LEFT FOOT - COMPLETE 3+ VIEW

[view not recorded (1 of 3)]
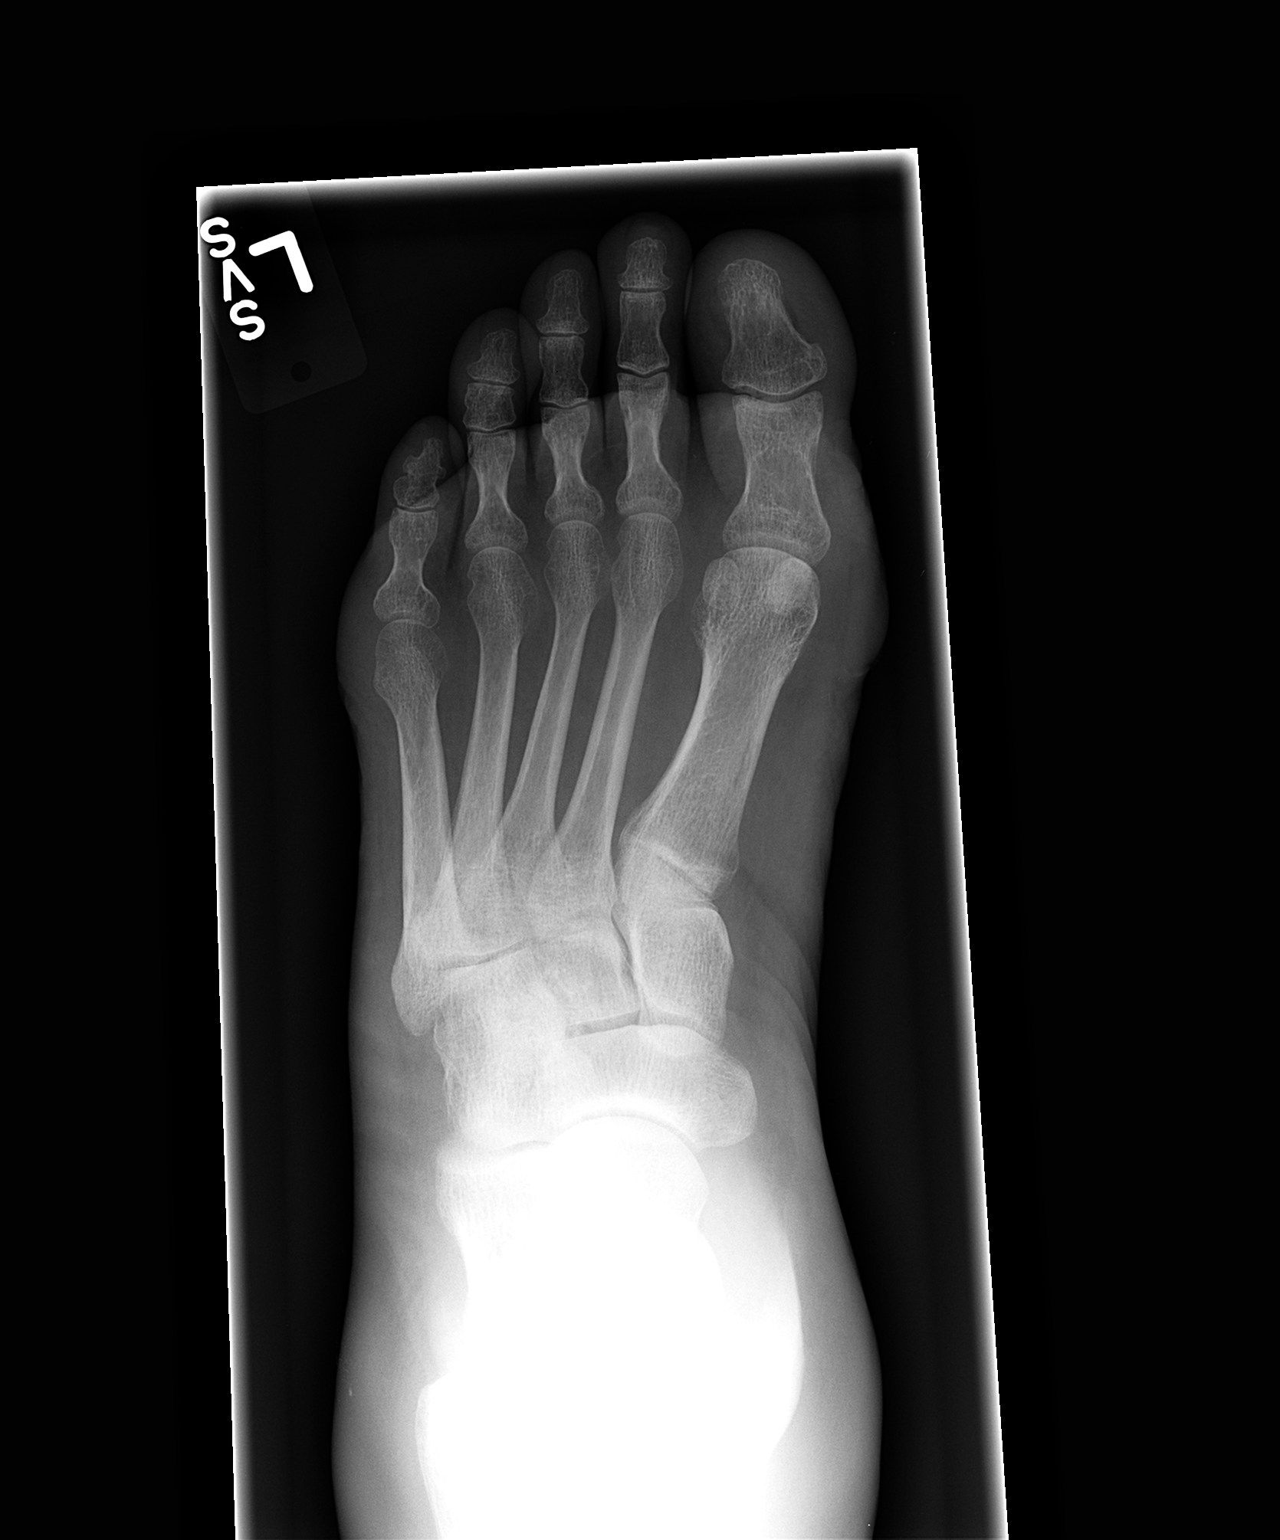

[view not recorded (2 of 3)]
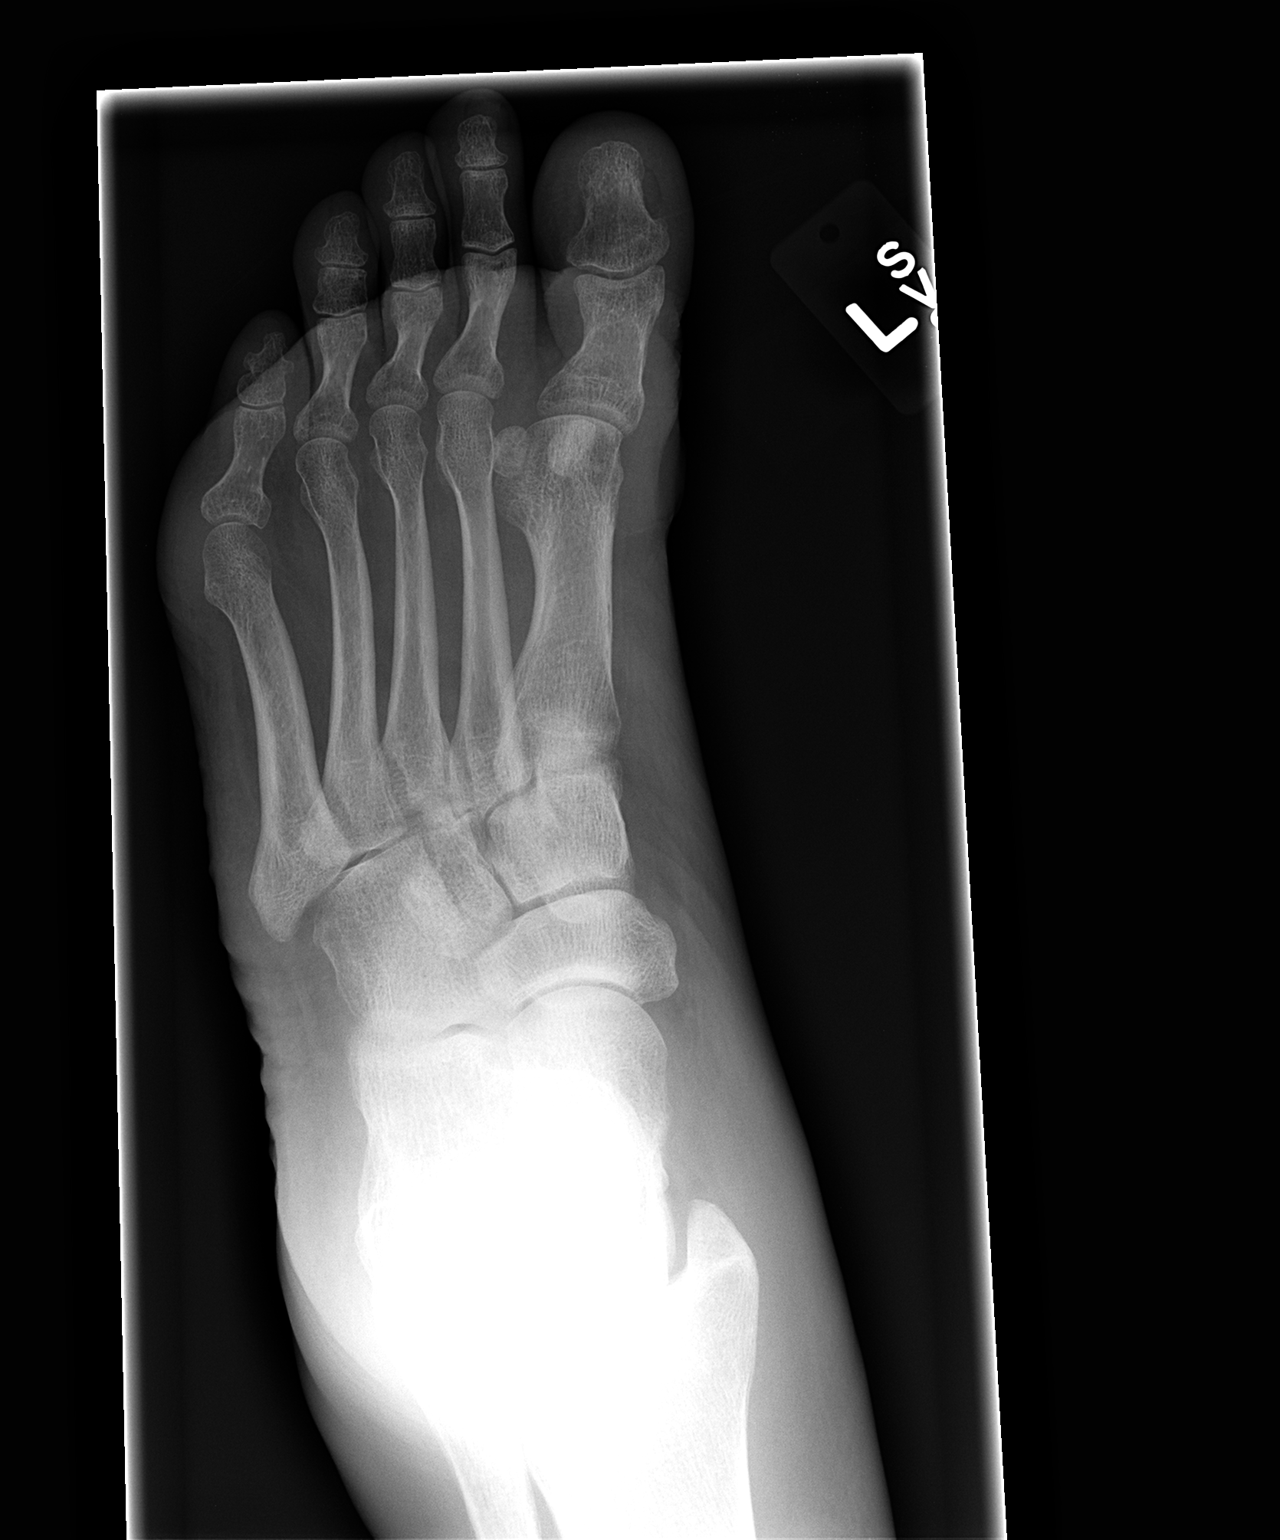

[view not recorded (3 of 3)]
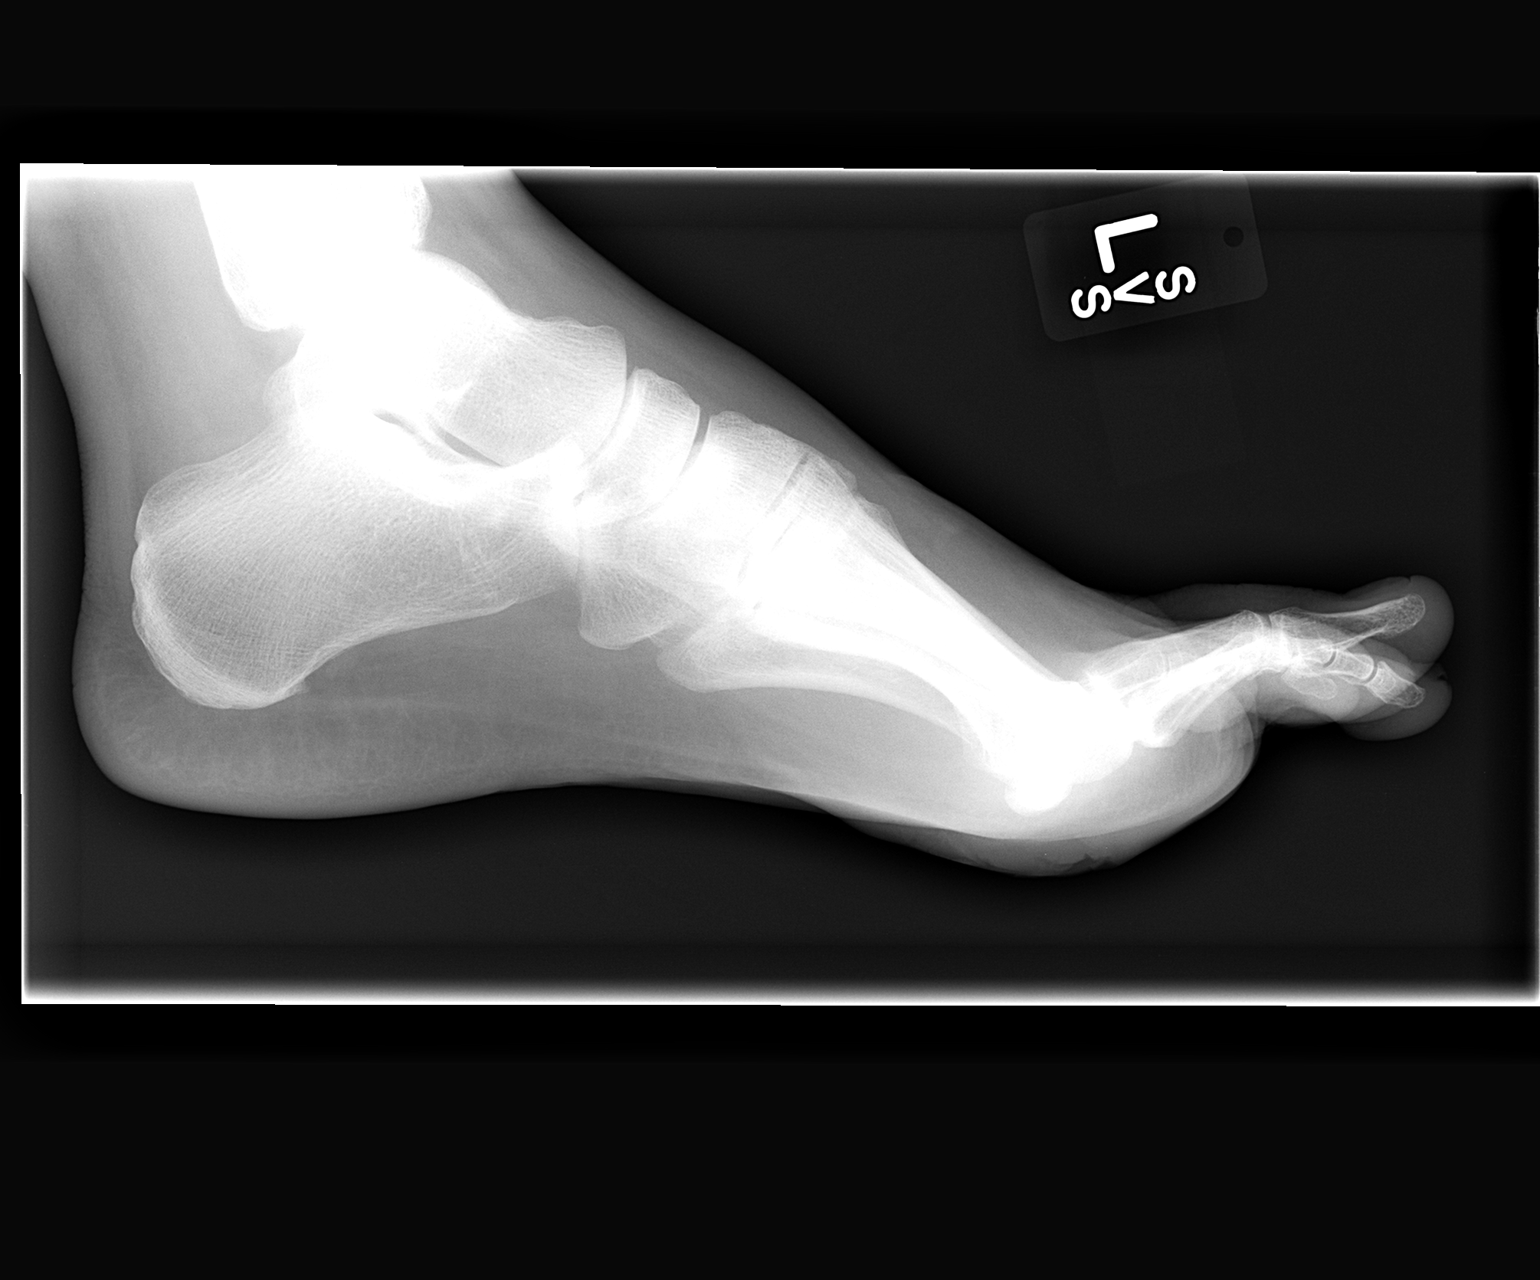

[3 of 3 positions shown; findings below may reference images not displayed]

FINDINGS: Soft tissue swelling left great toe and at the region of left first
MTP joint.
Mild diffuse osseous demineralization.
Joint spaces preserved.
No definite fracture, dislocation, or bone destruction.
IMPRESSION: No acute osseous abnormalities.

## 2012-07-31 IMAGING — US US EXTREM LOW VENOUS*L*
1 series · 13 of 24 positions shown · non-contrast
Comparison: None.

CLINICAL DATA: Left leg swollen and painful.  Left foot ulcer.

 LOWER EXTREMITY VENOUS DUPLEX ULTRASOUND
TECHNIQUE: Gray-scale sonography with graded compression, as well
as color Doppler and duplex ultrasound were performed to evaluate
the deep venous system of the lower extremity from the level of the
common femoral vein through the popliteal and proximal calf veins.
Spectral Doppler was utilized to evaluate flow at rest and with
distal augmentation maneuvers.

[Series 1: us extrem low venous*left* · 13 of 38 slices shown]
[im 1/38]
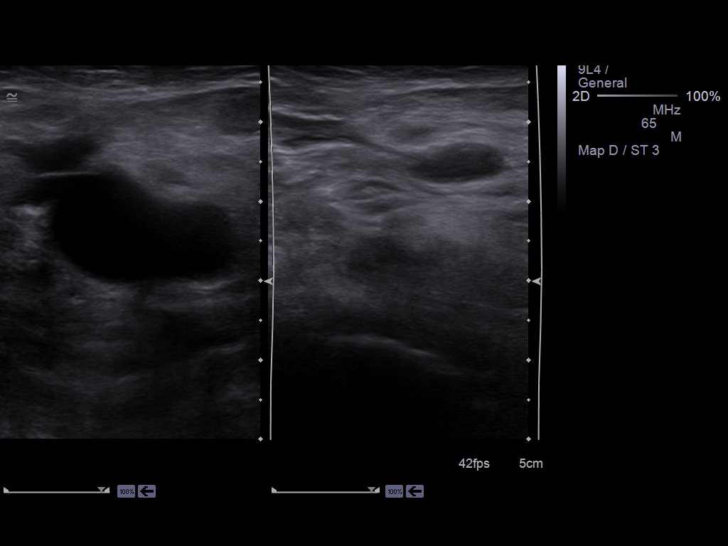
[im 4/38]
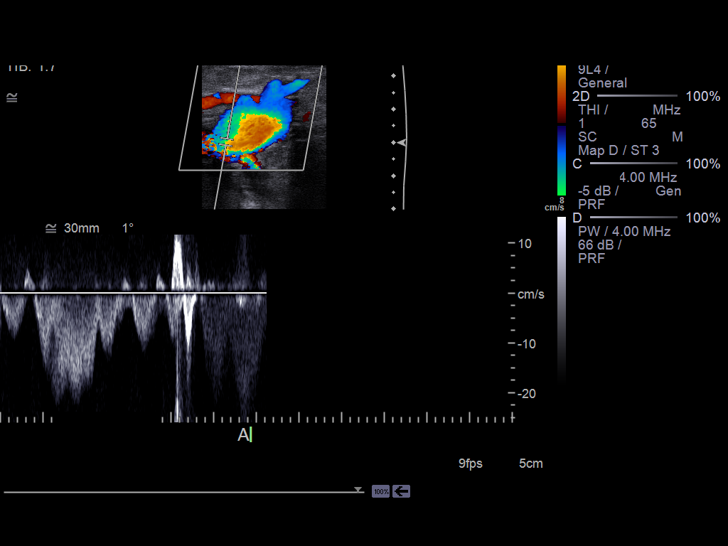
[im 7/38]
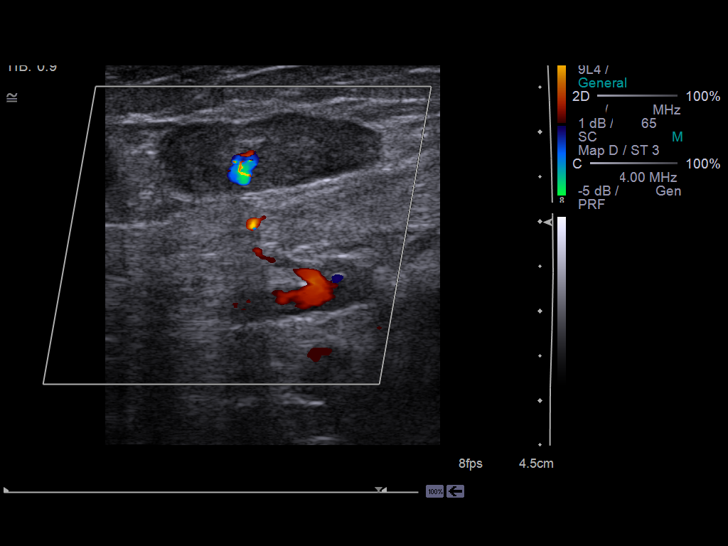
[im 10/38]
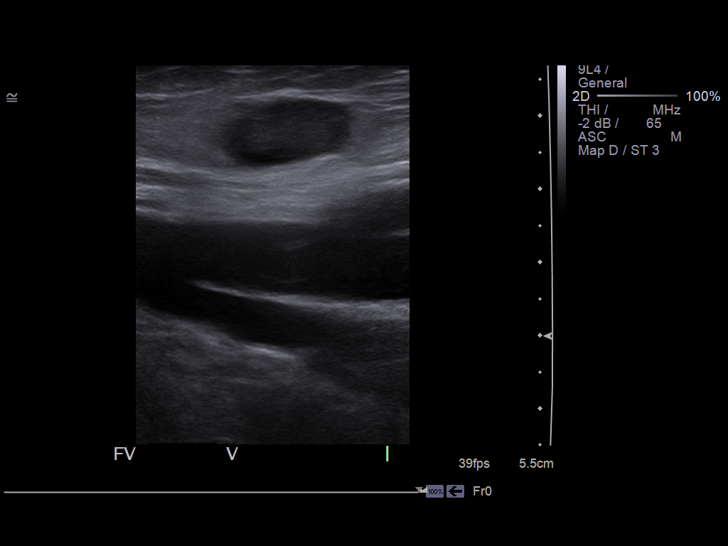
[im 13/38]
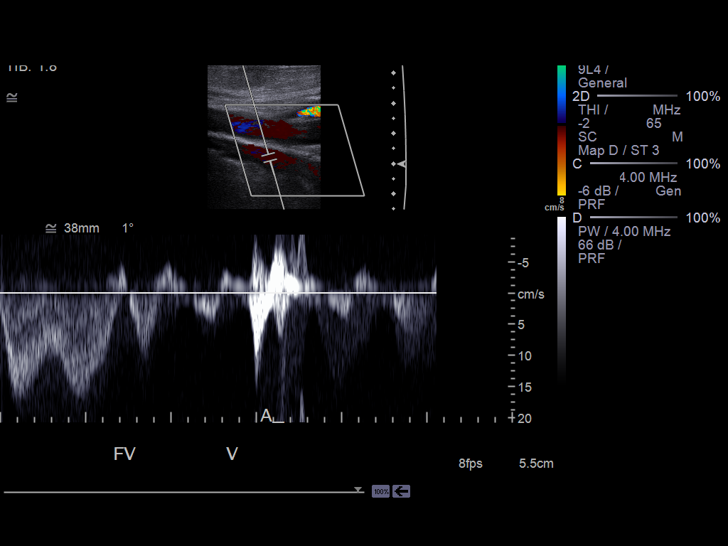
[im 17/38]
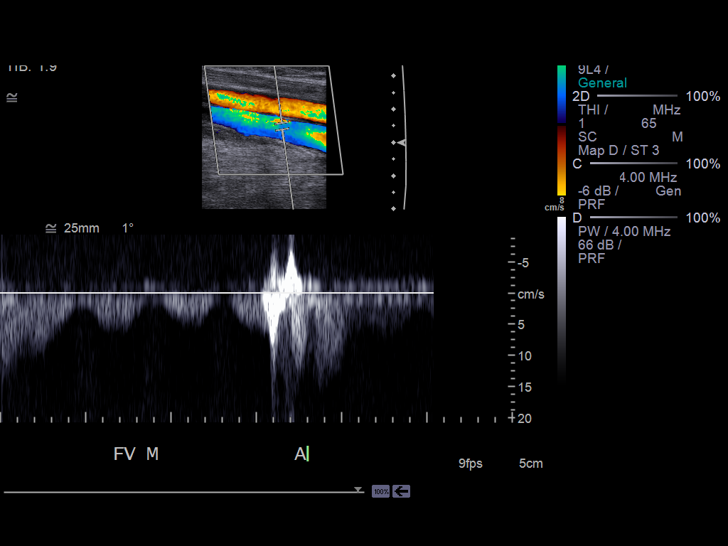
[im 20/38]
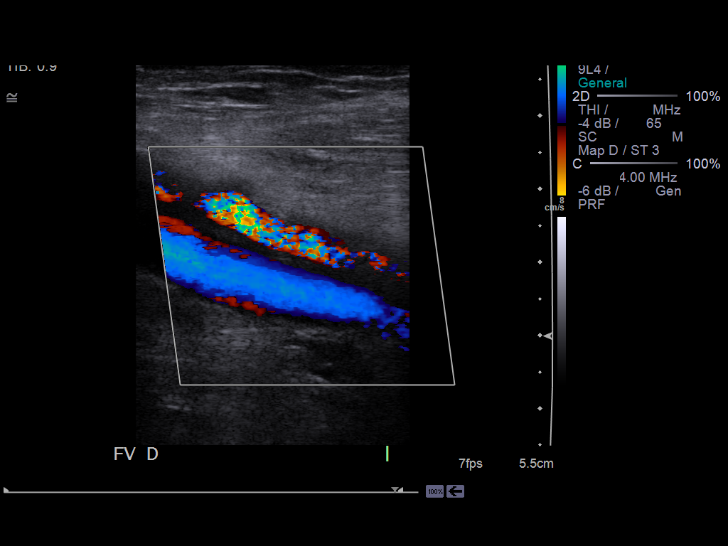
[im 21/38]
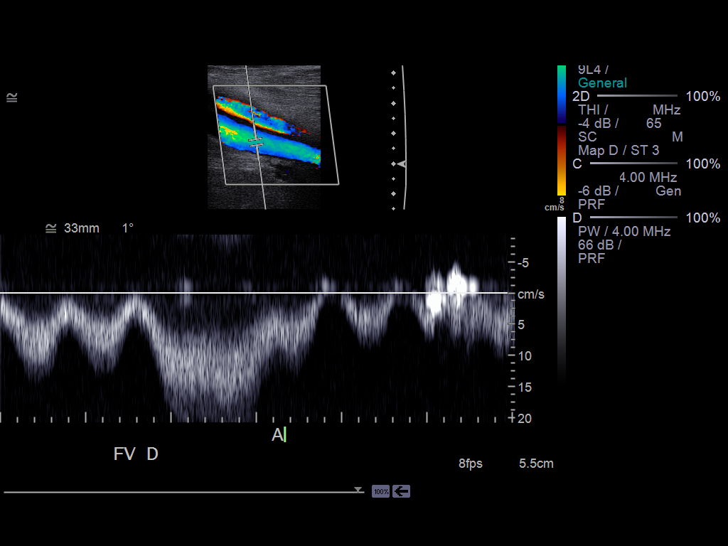
[im 25/38]
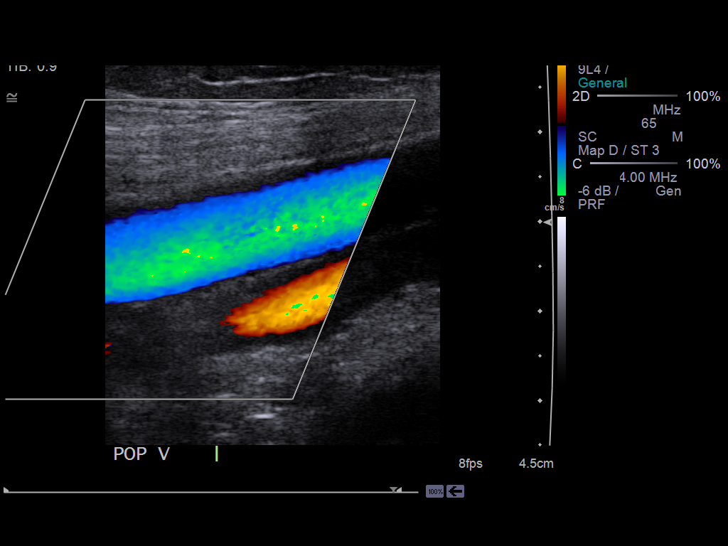
[im 28/38]
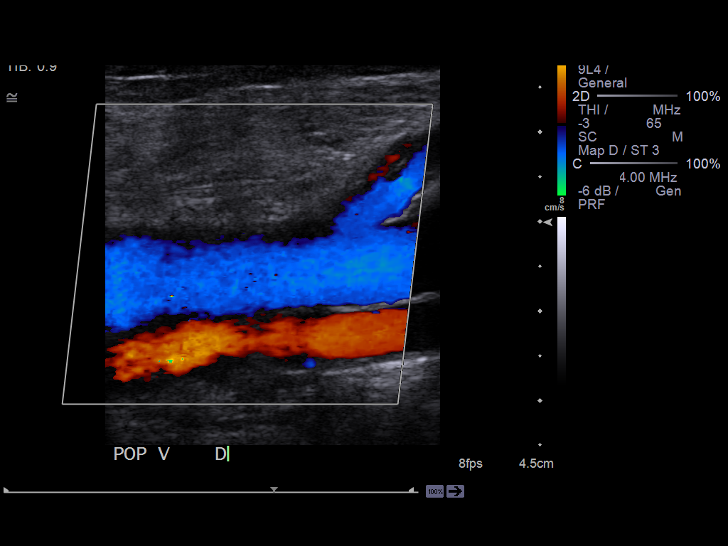
[im 31/38]
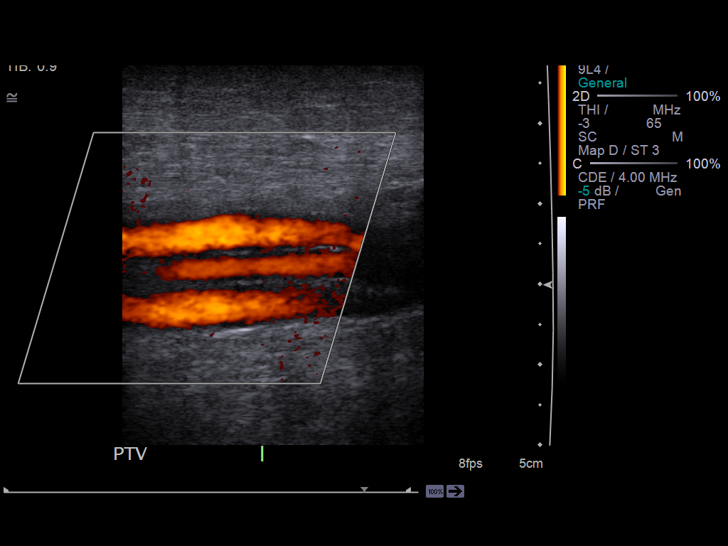
[im 34/38]
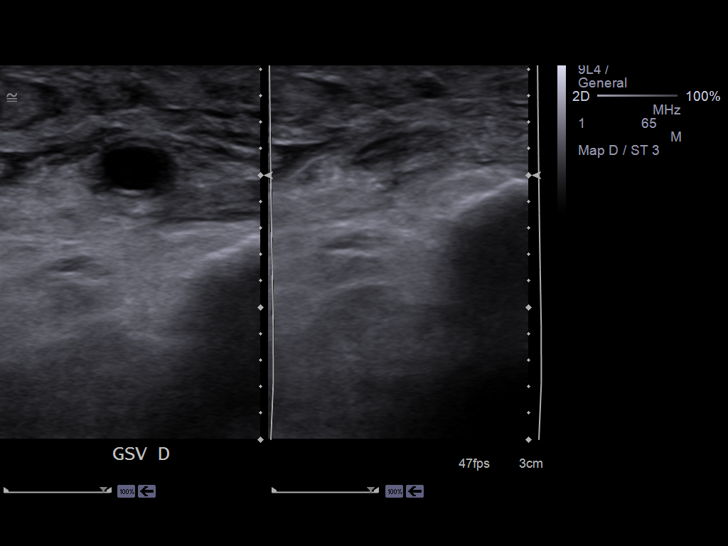
[im 38/38]
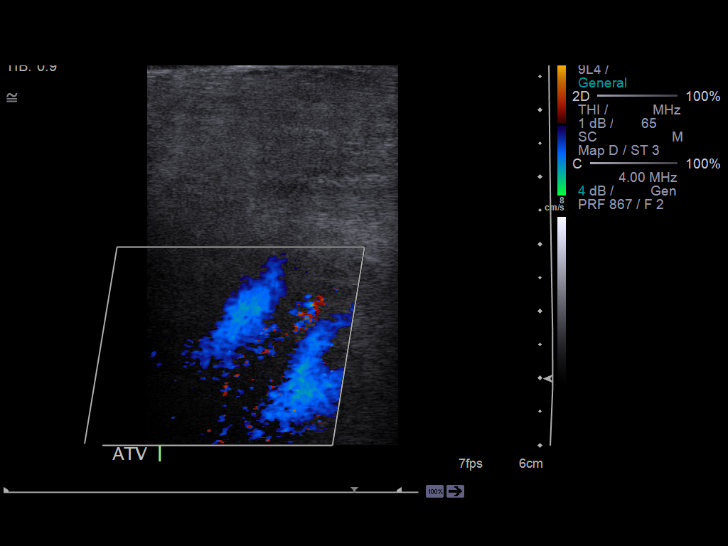

[13 of 24 positions shown; findings below may reference images not displayed]

FINDINGS: Normal compressibility of the common femoral,
superficial femoral, and popliteal veins is demonstrated, as well
as the visualized proximal calf veins.  No filling defects to
suggest DVT on grayscale or color Doppler imaging.  Doppler
waveforms show normal direction of venous flow, normal respiratory
phasicity and response to augmentation.

Subcutaneous edema is seen within the medial left calf on
examination.  The superficial veins are prominent this region but
compressible.

A 2.6 x 0.8 x 1.7 cm lymph node with preserved fatty hilum is
present in the subcutaneous tissues of the left near the groin.
IMPRESSION: No evidence of lower extremity deep vein thrombosis.

Subcutaneous edema of the calf.

Left groin lymph node imaged, likely reactive.

## 2012-08-05 ENCOUNTER — Emergency Department (HOSPITAL_COMMUNITY): Payer: Medicare Other

## 2012-08-05 ENCOUNTER — Encounter (HOSPITAL_COMMUNITY): Payer: Self-pay | Admitting: *Deleted

## 2012-08-05 ENCOUNTER — Emergency Department (HOSPITAL_COMMUNITY)
Admission: EM | Admit: 2012-08-05 | Discharge: 2012-08-05 | Disposition: A | Discharge status: Home / Self Care | Payer: Medicare Other | Attending: Emergency Medicine | Admitting: Emergency Medicine

## 2012-08-05 DIAGNOSIS — X500XXA Overexertion from strenuous movement or load, initial encounter: Secondary | ICD-10-CM | POA: Insufficient documentation

## 2012-08-05 DIAGNOSIS — E1169 Type 2 diabetes mellitus with other specified complication: Secondary | ICD-10-CM | POA: Insufficient documentation

## 2012-08-05 DIAGNOSIS — M25469 Effusion, unspecified knee: Secondary | ICD-10-CM | POA: Insufficient documentation

## 2012-08-05 DIAGNOSIS — Z87828 Personal history of other (healed) physical injury and trauma: Secondary | ICD-10-CM | POA: Insufficient documentation

## 2012-08-05 DIAGNOSIS — F17200 Nicotine dependence, unspecified, uncomplicated: Secondary | ICD-10-CM | POA: Insufficient documentation

## 2012-08-05 DIAGNOSIS — Z96659 Presence of unspecified artificial knee joint: Secondary | ICD-10-CM | POA: Insufficient documentation

## 2012-08-05 DIAGNOSIS — Z8719 Personal history of other diseases of the digestive system: Secondary | ICD-10-CM | POA: Insufficient documentation

## 2012-08-05 DIAGNOSIS — Y9289 Other specified places as the place of occurrence of the external cause: Secondary | ICD-10-CM | POA: Insufficient documentation

## 2012-08-05 DIAGNOSIS — Z8669 Personal history of other diseases of the nervous system and sense organs: Secondary | ICD-10-CM | POA: Insufficient documentation

## 2012-08-05 DIAGNOSIS — L97509 Non-pressure chronic ulcer of other part of unspecified foot with unspecified severity: Secondary | ICD-10-CM | POA: Insufficient documentation

## 2012-08-05 DIAGNOSIS — Z8659 Personal history of other mental and behavioral disorders: Secondary | ICD-10-CM | POA: Insufficient documentation

## 2012-08-05 DIAGNOSIS — S8990XA Unspecified injury of unspecified lower leg, initial encounter: Secondary | ICD-10-CM | POA: Insufficient documentation

## 2012-08-05 DIAGNOSIS — Z8739 Personal history of other diseases of the musculoskeletal system and connective tissue: Secondary | ICD-10-CM | POA: Insufficient documentation

## 2012-08-05 DIAGNOSIS — M25461 Effusion, right knee: Secondary | ICD-10-CM

## 2012-08-05 DIAGNOSIS — Y9301 Activity, walking, marching and hiking: Secondary | ICD-10-CM | POA: Insufficient documentation

## 2012-08-05 DIAGNOSIS — Z8701 Personal history of pneumonia (recurrent): Secondary | ICD-10-CM | POA: Insufficient documentation

## 2012-08-05 DIAGNOSIS — Z8673 Personal history of transient ischemic attack (TIA), and cerebral infarction without residual deficits: Secondary | ICD-10-CM | POA: Insufficient documentation

## 2012-08-05 DIAGNOSIS — S99929A Unspecified injury of unspecified foot, initial encounter: Secondary | ICD-10-CM | POA: Insufficient documentation

## 2012-08-05 MED ORDER — OXYCODONE-ACETAMINOPHEN 10-325 MG PO TABS: 1.0000 | ORAL_TABLET | Freq: Four times a day (QID) | ORAL | Status: DC | PRN

## 2012-08-05 NOTE — ED Notes (Signed)
Pain rt knee, with swelling,  Rt knee"gave out and twisted".

## 2012-08-05 NOTE — ED Notes (Signed)
Pt returned and said that he had lost his Rx for oxycodone.  Pharmacy had already called the  PA and told her that he already had an Rx. Pt left when I told him that.

## 2012-08-05 NOTE — ED Provider Notes (Signed)
History    CSN: [id number] Arrival date & time 08/05/12  1516  First MD Initiated Contact with Patient 08/05/12 1622     Chief Complaint  Patient presents with  . Knee Pain   (Consider location/radiation/quality/duration/timing/severity/associated sxs/prior Treatment) Patient is a 47 y.o. male presenting with knee pain. The history is provided by the patient.  Knee Pain Location:  Knee Time since incident:  6 hours Injury: yes   Mechanism of injury comment:  Twisted knee Knee location:  R knee Pain details:    Quality:  Throbbing   Radiates to:  Does not radiate   Severity:  Severe   Onset quality:  Sudden   Duration:  6 hours   Timing:  Constant   Progression:  Unchanged Chronicity:  New Dislocation: no   Foreign body present:  No foreign bodies Relieved by:  Nothing Worsened by:  Activity and bearing weight Ineffective treatments:  NSAIDs Associated symptoms: decreased ROM and swelling   Associated symptoms: no fever and no neck pain    Peretz Thieme. is a 47 y.o. male who presents to the ED with knee pain. Today he was walking and the knee felt weak and twisted causing severe pain.  Hx of right knee replacement 5 months ago. Patient called Dr. Audrie Lia office today but could not get appointment until next week. He has been applying ice and elevating today without relief. He wants to be sure everything is still in place.   Past Medical History  Diagnosis Date  . Diabetic foot ulcer   . ETOH abuse   . Anxiety   . Open wound     bottom of foot  . Diabetes mellitus without complication     borderline  . Neuromuscular disorder     neuropathy  . GERD (gastroesophageal reflux disease)     tums  . Pneumonia ~ 2012  . History of blood transfusion     "related to left knee OR; probably right hip too" (04/21/2012)  . Stroke 2008    "they said I might have had one during right hip replacement" (04/21/2012)  . Arthritis     "everywhere" (04/21/2012)  . Mental disorder   .  Depression    Past Surgical History  Procedure Laterality Date  . Total hip arthroplasty Right 2008  . Total knee arthroplasty Left 2006  . Joint replacement    . Lung lobectomy Left ~ 2006  . Revision total hip arthroplasty Right 2008    "4-5 months after replacement" (04/21/2012)  . Knee arthroscopy Bilateral 1980's/1990's  . Lung lobectomy    . Metatarsal osteotomy  10/29/2011    Procedure: METATARSAL OSTEOTOMY;  Surgeon: Nadara Mustard, MD;  Location[facility];  Service: Orthopedics;  Laterality: Left;  Left 1st Metatarsal Dorsal Closing Wedge   . Total knee arthroplasty Right 04/20/2012  . Total knee arthroplasty Right 04/20/2012    Procedure: TOTAL KNEE ARTHROPLASTY;  Surgeon: Nadara Mustard, MD;  Location: [facility] OR;  Service: Orthopedics;  Laterality: Right;  Right Total Knee Arthroplasty   History reviewed. No pertinent family history. History  Substance Use Topics  . Smoking status: Current Every Day Smoker -- 1.50 packs/day for 30 years    Types: Cigarettes  . Smokeless tobacco: Never Used     Comment: 04/21/2012 offered smoking cessation materials; pt declines  . Alcohol Use: 0.0 oz/week     Comment: 04/21/2012 "last alcohol ~ 1 1/2 months ago; hx. alcohol abuse in past"  Review of Systems  Constitutional: Negative for fever and chills.  HENT: Negative for neck pain.   Respiratory: Negative for shortness of breath.   Cardiovascular: Negative for chest pain.  Gastrointestinal: Negative for nausea and vomiting.  Musculoskeletal:       Right knee pain and swelling  Skin: Negative for rash.  Neurological: Negative for headaches.  Psychiatric/Behavioral: The patient is not nervous/anxious.     Allergies  Benadryl and Trazodone and nefazodone  Home Medications   Current Outpatient Rx  Name  Route  Sig  Dispense  Refill  . ibuprofen (ADVIL,MOTRIN) 200 MG tablet   Oral   Take 800 mg by mouth every 6 (six) hours as needed for pain.         Marland Kitchen oxyCODONE (ROXICODONE) 5 MG  immediate release tablet   Oral   Take 1 tablet (5 mg total) by mouth every 4 (four) hours as needed for pain.   15 tablet   0   . oxyCODONE-acetaminophen (PERCOCET/ROXICET) 5-325 MG per tablet   Oral   Take 2 tablets by mouth every 4 (four) hours as needed for pain.   6 tablet   0    BP 136/79  Pulse 89  Temp(Src) 98.1 F (36.7 C) (Oral)  Resp 16  Ht 6\' 3"  (1.905 m)  Wt 200 lb (90.719 kg)  BMI 25 kg/m2  SpO2 98% Physical Exam  Nursing note and vitals reviewed. Constitutional: He is oriented to person, place, and time. He appears well-developed and well-nourished. No distress.  HENT:  Head: Normocephalic.  Eyes: EOM are normal.  Neck: Normal range of motion. Neck supple.  Cardiovascular: Normal rate.   Pulmonary/Chest: Effort normal.  Musculoskeletal:       Right knee: He exhibits decreased range of motion, swelling and effusion. He exhibits no ecchymosis, no deformity, no laceration, no erythema and normal alignment. Tenderness found. Patellar tendon tenderness noted.       Legs: Increased warmth right knee. Increased pain with palpation and flexion.  Neurological: He is alert and oriented to person, place, and time. He has normal strength. No cranial nerve deficit.  Pedal pulses equal, adequate circulation  Skin: Skin is warm and dry.  Psychiatric: He has a normal mood and affect. His behavior is normal.    ED Course  Procedures (including critical care time) Labs Reviewed - No data to display Dg Knee Complete 4 Views Right  08/05/2012   *RADIOLOGY REPORT*  Clinical Data: Right knee pain.  RIGHT KNEE - COMPLETE 4+ VIEW  Comparison: 07/12/2012  Findings: Knee arthroplasty shows stable and normal alignment.  No evidence of fracture, dislocation or hardware failure.  No abnormal lucency is seen surrounding hardware.  There remains evidence of a probable underlying joint effusion.  IMPRESSION: Probable joint effusion.  Stable and normal appearance of right knee  arthroplasty.   Original Report Authenticated By: Irish Lack, M.D.    MDM  47 y.o. male with right knee effusion. X-ray shows stable and normal alignment. Patient has knee immobilizer at home and will wear. Will apply ice, elevate, pain management and follow up with Dr. Lajoyce Corners. He will return here as needed. No signs of compartment syndrome at this time.  I have reviewed this patient's vital signs, nurses notes, appropriate imaging and discussed results with the patient and plan of care. He voices understanding.    Medication List    STOP taking these medications       oxyCODONE 5 MG immediate release tablet  Commonly  known as:  ROXICODONE     oxyCODONE-acetaminophen 5-325 MG per tablet  Commonly known as:  PERCOCET/ROXICET  Replaced by:  oxyCODONE-acetaminophen 10-325 MG per tablet      TAKE these medications       oxyCODONE-acetaminophen 10-325 MG per tablet  Commonly known as:  PERCOCET  Take 1 tablet by mouth every 6 (six) hours as needed for pain.      ASK your doctor about these medications       ibuprofen 200 MG tablet  Commonly known as:  ADVIL,MOTRIN  Take 800 mg by mouth every 6 (six) hours as needed for pain.           [facility] Orlene Och, NP 08/05/12 [phone] Pharmacy called and states that the patient currently gets Oxycodone monthly from 2 different physicians at 2 different pharmacies. Dr. Lajoyce Corners gives 60 tabs monthly and another doctor 30 tablets monthly. Patient had stated that he had Percocet after his knee replacement and was out of medication. Will change from 20 tablets to 6 tablets and he is to contact his doctor for any additional pain medication.   [facility] Orlene Och, [place] 08/05/12 [phone]

## 2012-08-05 NOTE — ED Notes (Signed)
R knee replacement 5 months ago.  Gave out on him today, then twisted.  Now swollen and painful.

## 2012-08-06 NOTE — ED Provider Notes (Signed)
Medical screening examination/treatment/procedure(s) were performed by non-physician practitioner and as supervising physician I was immediately available for consultation/collaboration.  Sudais Banghart L Dyshon Philbin, MD 08/06/12 0152 

## 2012-08-15 NOTE — ED Provider Notes (Signed)
I received a letter from this patient's insurance company questioning for "unusual medication utilization patterns which may indicate possible drug over-utilization and may place them at risk for drug-induced adverse effects". This is from [employer].  Pt has listed mulitple narcotic prescriptions from dr Lajoyce Corners, Dr Olena Leatherwood and out ED.    Javier Givens, MD 08/15/12 [phone]

## 2012-11-24 ENCOUNTER — Other Ambulatory Visit: Payer: Self-pay

## 2012-12-02 ENCOUNTER — Encounter (HOSPITAL_COMMUNITY): Payer: Self-pay | Admitting: Emergency Medicine

## 2012-12-02 ENCOUNTER — Encounter (HOSPITAL_COMMUNITY): Payer: Self-pay | Admitting: *Deleted

## 2012-12-02 ENCOUNTER — Emergency Department (HOSPITAL_COMMUNITY)
Admission: EM | Admit: 2012-12-02 | Discharge: 2012-12-02 | Disposition: A | Discharge status: Other Acute Inpatient Hospital | Payer: Medicare Other | Source: Home / Self Care | Attending: Emergency Medicine | Admitting: Emergency Medicine

## 2012-12-02 ENCOUNTER — Inpatient Hospital Stay (HOSPITAL_COMMUNITY)
Admission: AD | Admit: 2012-12-02 | Discharge: 2012-12-07 | DRG: 897 | Disposition: A | Discharge status: Home / Self Care | Payer: Medicare Other | Source: Intra-hospital | Attending: Psychiatry | Admitting: Psychiatry

## 2012-12-02 DIAGNOSIS — F1994 Other psychoactive substance use, unspecified with psychoactive substance-induced mood disorder: Secondary | ICD-10-CM

## 2012-12-02 DIAGNOSIS — E119 Type 2 diabetes mellitus without complications: Secondary | ICD-10-CM | POA: Diagnosis present

## 2012-12-02 DIAGNOSIS — F17200 Nicotine dependence, unspecified, uncomplicated: Secondary | ICD-10-CM | POA: Insufficient documentation

## 2012-12-02 DIAGNOSIS — Z8701 Personal history of pneumonia (recurrent): Secondary | ICD-10-CM | POA: Insufficient documentation

## 2012-12-02 DIAGNOSIS — F1410 Cocaine abuse, uncomplicated: Secondary | ICD-10-CM | POA: Diagnosis present

## 2012-12-02 DIAGNOSIS — F1910 Other psychoactive substance abuse, uncomplicated: Secondary | ICD-10-CM | POA: Diagnosis present

## 2012-12-02 DIAGNOSIS — E1169 Type 2 diabetes mellitus with other specified complication: Secondary | ICD-10-CM | POA: Insufficient documentation

## 2012-12-02 DIAGNOSIS — Z8673 Personal history of transient ischemic attack (TIA), and cerebral infarction without residual deficits: Secondary | ICD-10-CM | POA: Insufficient documentation

## 2012-12-02 DIAGNOSIS — R000 Tachycardia, unspecified: Secondary | ICD-10-CM | POA: Insufficient documentation

## 2012-12-02 DIAGNOSIS — F410 Panic disorder [episodic paroxysmal anxiety] without agoraphobia: Secondary | ICD-10-CM | POA: Diagnosis present

## 2012-12-02 DIAGNOSIS — M129 Arthropathy, unspecified: Secondary | ICD-10-CM | POA: Insufficient documentation

## 2012-12-02 DIAGNOSIS — F332 Major depressive disorder, recurrent severe without psychotic features: Secondary | ICD-10-CM | POA: Diagnosis present

## 2012-12-02 DIAGNOSIS — L97509 Non-pressure chronic ulcer of other part of unspecified foot with unspecified severity: Secondary | ICD-10-CM | POA: Insufficient documentation

## 2012-12-02 DIAGNOSIS — Z79899 Other long term (current) drug therapy: Secondary | ICD-10-CM

## 2012-12-02 DIAGNOSIS — Z8719 Personal history of other diseases of the digestive system: Secondary | ICD-10-CM | POA: Insufficient documentation

## 2012-12-02 DIAGNOSIS — K219 Gastro-esophageal reflux disease without esophagitis: Secondary | ICD-10-CM | POA: Diagnosis present

## 2012-12-02 DIAGNOSIS — F411 Generalized anxiety disorder: Secondary | ICD-10-CM | POA: Insufficient documentation

## 2012-12-02 DIAGNOSIS — F1020 Alcohol dependence, uncomplicated: Principal | ICD-10-CM | POA: Diagnosis present

## 2012-12-02 DIAGNOSIS — L84 Corns and callosities: Secondary | ICD-10-CM | POA: Insufficient documentation

## 2012-12-02 DIAGNOSIS — F1010 Alcohol abuse, uncomplicated: Secondary | ICD-10-CM

## 2012-12-02 DIAGNOSIS — Z8669 Personal history of other diseases of the nervous system and sense organs: Secondary | ICD-10-CM | POA: Insufficient documentation

## 2012-12-02 HISTORY — DX: Reserved for concepts with insufficient information to code with codable children: IMO0002

## 2012-12-02 LAB — CBC WITH DIFFERENTIAL/PLATELET
Eosinophils Absolute: 0.1 10*3/uL (ref 0.0–0.7)
Eosinophils Relative: 2 % (ref 0–5)
HCT: 44.7 % (ref 39.0–52.0)
Lymphs Abs: 2.5 10*3/uL (ref 0.7–4.0)
MCH: 24.3 pg — ABNORMAL LOW (ref 26.0–34.0)
MCV: 74.5 fL — ABNORMAL LOW (ref 78.0–100.0)
Monocytes Absolute: 0.5 10*3/uL (ref 0.1–1.0)
Platelets: 238 10*3/uL (ref 150–400)
RBC: 6 MIL/uL — ABNORMAL HIGH (ref 4.22–5.81)
RDW: 19 % — ABNORMAL HIGH (ref 11.5–15.5)

## 2012-12-02 LAB — BASIC METABOLIC PANEL
Calcium: 9.3 mg/dL (ref 8.4–10.5)
Creatinine, Ser: 0.93 mg/dL (ref 0.50–1.35)
GFR calc non Af Amer: >90 mL/min (ref >90)
Glucose, Bld: 98 mg/dL (ref 70–99)
Sodium: 137 mEq/L (ref 135–145)

## 2012-12-02 MED ORDER — CHLORDIAZEPOXIDE HCL 25 MG PO CAPS
25.0000 mg | ORAL_CAPSULE | ORAL | Status: AC
  Administered 2012-12-05 – 2012-12-06 (×2): 25 mg via ORAL
  Filled 2012-12-02 (×2): qty 1

## 2012-12-02 MED ORDER — NICOTINE 21 MG/24HR TD PT24
21.0000 mg | MEDICATED_PATCH | Freq: Once | TRANSDERMAL | Status: DC
  Administered 2012-12-02: 21 mg via TRANSDERMAL
  Filled 2012-12-02: qty 1

## 2012-12-02 MED ORDER — LORAZEPAM 2 MG/ML IJ SOLN
2.0000 mg | Freq: Once | INTRAMUSCULAR | Status: AC
  Administered 2012-12-02: 2 mg via INTRAMUSCULAR
  Filled 2012-12-02: qty 1

## 2012-12-02 MED ORDER — LORAZEPAM 1 MG PO TABS
0.0000 mg | ORAL_TABLET | Freq: Four times a day (QID) | ORAL | Status: DC
  Administered 2012-12-02: 1 mg via ORAL
  Filled 2012-12-02 (×2): qty 1

## 2012-12-02 MED ORDER — LOPERAMIDE HCL 2 MG PO CAPS: 2.0000 mg | ORAL_CAPSULE | ORAL | Status: AC | PRN

## 2012-12-02 MED ORDER — CHLORDIAZEPOXIDE HCL 25 MG PO CAPS
25.0000 mg | ORAL_CAPSULE | Freq: Four times a day (QID) | ORAL | Status: AC
  Administered 2012-12-02 – 2012-12-04 (×6): 25 mg via ORAL
  Filled 2012-12-02 (×6): qty 1

## 2012-12-02 MED ORDER — VITAMIN B-1 100 MG PO TABS
100.0000 mg | ORAL_TABLET | Freq: Every day | ORAL | Status: DC
  Administered 2012-12-03 – 2012-12-07 (×5): 100 mg via ORAL
  Filled 2012-12-02 (×6): qty 1

## 2012-12-02 MED ORDER — MAGNESIUM HYDROXIDE 400 MG/5ML PO SUSP: 30.0000 mL | Freq: Every day | ORAL | Status: DC | PRN

## 2012-12-02 MED ORDER — VITAMIN B-1 100 MG PO TABS
100.0000 mg | ORAL_TABLET | Freq: Every day | ORAL | Status: DC
  Administered 2012-12-02: 17:00:00 100 mg via ORAL
  Filled 2012-12-02: qty 1

## 2012-12-02 MED ORDER — LORAZEPAM 1 MG PO TABS: 0.0000 mg | ORAL_TABLET | Freq: Two times a day (BID) | ORAL | Status: DC

## 2012-12-02 MED ORDER — MIRTAZAPINE 7.5 MG PO TABS
7.5000 mg | ORAL_TABLET | Freq: Every day | ORAL | Status: DC
  Administered 2012-12-02: 7.5 mg via ORAL
  Filled 2012-12-02 (×3): qty 1

## 2012-12-02 MED ORDER — ADULT MULTIVITAMIN W/MINERALS CH
1.0000 | ORAL_TABLET | Freq: Every day | ORAL | Status: DC
  Administered 2012-12-03 – 2012-12-07 (×5): 1 via ORAL
  Filled 2012-12-02 (×6): qty 1

## 2012-12-02 MED ORDER — QUETIAPINE FUMARATE 100 MG PO TABS
100.0000 mg | ORAL_TABLET | Freq: Every day | ORAL | Status: DC
  Administered 2012-12-02 – 2012-12-06 (×5): 100 mg via ORAL
  Filled 2012-12-02 (×7): qty 1

## 2012-12-02 MED ORDER — IBUPROFEN 800 MG PO TABS
800.0000 mg | ORAL_TABLET | Freq: Four times a day (QID) | ORAL | Status: DC | PRN
  Administered 2012-12-02 – 2012-12-07 (×10): 800 mg via ORAL
  Filled 2012-12-02 (×10): qty 1

## 2012-12-02 MED ORDER — CHLORDIAZEPOXIDE HCL 25 MG PO CAPS
25.0000 mg | ORAL_CAPSULE | Freq: Four times a day (QID) | ORAL | Status: DC | PRN
  Administered 2012-12-02 – 2012-12-05 (×6): 25 mg via ORAL
  Filled 2012-12-02 (×6): qty 1

## 2012-12-02 MED ORDER — ONDANSETRON 4 MG PO TBDP: 4.0000 mg | ORAL_TABLET | Freq: Four times a day (QID) | ORAL | Status: AC | PRN

## 2012-12-02 MED ORDER — ACETAMINOPHEN 325 MG PO TABS: 650.0000 mg | ORAL_TABLET | Freq: Four times a day (QID) | ORAL | Status: DC | PRN

## 2012-12-02 MED ORDER — ALUM & MAG HYDROXIDE-SIMETH 200-200-20 MG/5ML PO SUSP: 30.0000 mL | ORAL | Status: DC | PRN

## 2012-12-02 MED ORDER — CHLORDIAZEPOXIDE HCL 25 MG PO CAPS
25.0000 mg | ORAL_CAPSULE | Freq: Three times a day (TID) | ORAL | Status: AC
  Administered 2012-12-04 – 2012-12-05 (×3): 25 mg via ORAL
  Filled 2012-12-02 (×3): qty 1

## 2012-12-02 MED ORDER — THIAMINE HCL 100 MG/ML IJ SOLN: 100.0000 mg | Freq: Every day | INTRAMUSCULAR | Status: DC

## 2012-12-02 MED ORDER — LORAZEPAM 1 MG PO TABS
1.0000 mg | ORAL_TABLET | Freq: Once | ORAL | Status: AC
  Administered 2012-12-02: 1 mg via ORAL

## 2012-12-02 MED ORDER — CHLORDIAZEPOXIDE HCL 25 MG PO CAPS
25.0000 mg | ORAL_CAPSULE | Freq: Every day | ORAL | Status: AC
  Administered 2012-12-07: 25 mg via ORAL
  Filled 2012-12-02: qty 1

## 2012-12-02 MED ORDER — HYDROXYZINE HCL 25 MG PO TABS
25.0000 mg | ORAL_TABLET | Freq: Four times a day (QID) | ORAL | Status: AC | PRN
  Administered 2012-12-02: 25 mg via ORAL
  Filled 2012-12-02: qty 1

## 2012-12-02 NOTE — ED Notes (Signed)
Telepsych being completed at this time. 

## 2012-12-02 NOTE — BHH Counselor (Signed)
Writer spoke with Dr. Fayrene Fearing, ([facility] w/pt) and was provided clinicals. Pt is seeking etoh detox. Denice Bors, ICAADC 12/02/2012 1:35 PM

## 2012-12-02 NOTE — BH Assessment (Signed)
Patient accepted to [facility] by Fransisca Kaufmann, NP to Dr. Geoffery Lyons. The room assignment is 301-1. Nurse to nurse report # is [phone].   Writer notified patient's nurse-Amber of patient's updated disposition/acceptance to [facility]. Writer asked nurse to have patient sign the voluntary form and consent prior to sending the patient, fax to [phone], and send original paperwork with patient.   Writer also notified EPD-Dr. Manus Gunning of patient's acceptance to [facility].

## 2012-12-02 NOTE — ED Notes (Addendum)
Pt says he wants to stop drinking alcohol. Last drank this morning. Says he has been drinking heavily for 1 month.  Using cocaine and marijuana.

## 2012-12-02 NOTE — ED Notes (Addendum)
Pt up to restroom.  Pt diaphoretic and shaky while ambulating.  Pt reports that po ativan doesn't seem to be helping with his withdrawals.  Notified edp.  Edp gave verbal order for 2mg  Ativan IM

## 2012-12-02 NOTE — Tx Team (Signed)
Initial Interdisciplinary Treatment Plan  PATIENT STRENGTHS: (choose at least two) Ability for insight Average or above average intelligence Capable of independent living General fund of knowledge  PATIENT STRESSORS: Substance abuse   PROBLEM LIST: Problem List/Patient Goals Date to be addressed Date deferred Reason deferred Estimated date of resolution  ETOH abuse 12/02/12     Depression 12/02/12                                                DISCHARGE CRITERIA:  Ability to meet basic life and health needs Improved stabilization in mood, thinking, and/or behavior Verbal commitment to aftercare and medication compliance Withdrawal symptoms are absent or subacute and managed without 24-hour nursing intervention  PRELIMINARY DISCHARGE PLAN: Attend aftercare/continuing care group Return to previous living arrangement  PATIENT/FAMIILY INVOLVEMENT: This treatment plan has been presented to and reviewed with the patient, [name], and/or family member, .  The patient and family have been given the opportunity to ask questions and make suggestions.  [name], [place] 12/02/2012, 10:09 PM

## 2012-12-02 NOTE — BH Assessment (Signed)
Assessment Note  [name] is an 47 y.o. male that presented to [facility] seeking alcohol detox. He also abuses cocaine. Sts that he is drinking 1 case (24 cans) of beer per day. He reports drinking this amount daily since September 2014. Patient's last drink was noon today. He also uses cocaine 3x's daily. Duration of use is 2 weeks. His last use was 2 nights ago. He denies history of seizures and blackouts. His reported withdrawal symptoms are "shakes" and anxiety. He has received substance abuse treatment here at [facility] March 2014 and [facility] ([facility]) last year for two weeks. His longest period of sobriety is 5-6 yrs.   Patient denies SI, HI, and AVH's. Patient does report issues with depression and anxiety. Says that his biggest stressor at this time is loneliness.   Patient requesting detox. Writer will run patient by a extender for potential acceptance to this facility. Per [facility] beds are available.   Axis I: Alcohol Dependence; Cocaine Abuse; Depressive Disorder NOS Axis II: Deferred Axis III:  Past Medical History  Diagnosis Date  . Diabetic foot ulcer   . ETOH abuse   . Anxiety   . Open wound     bottom of foot  . Diabetes mellitus without complication     borderline  . Neuromuscular disorder     neuropathy  . GERD (gastroesophageal reflux disease)     tums  . Pneumonia ~ 2012  . History of blood transfusion     "related to left knee OR; probably right hip too" (04/21/2012)  . Stroke 2008    "they said I might have had one during right hip replacement" (04/21/2012)  . Arthritis     "everywhere" (04/21/2012)  . Mental disorder   . Depression   . DDD (degenerative disc disease)    Axis IV: other psychosocial or environmental problems, problems related to social environment, problems with access to health care services and problems with primary support group Axis V: 31-40 impairment in reality testing  Past Medical History:  Past Medical History  Diagnosis Date  . Diabetic foot ulcer    . ETOH abuse   . Anxiety   . Open wound     bottom of foot  . Diabetes mellitus without complication     borderline  . Neuromuscular disorder     neuropathy  . GERD (gastroesophageal reflux disease)     tums  . Pneumonia ~ 2012  . History of blood transfusion     "related to left knee OR; probably right hip too" (04/21/2012)  . Stroke 2008    "they said I might have had one during right hip replacement" (04/21/2012)  . Arthritis     "everywhere" (04/21/2012)  . Mental disorder   . Depression   . DDD (degenerative disc disease)     Past Surgical History  Procedure Laterality Date  . Total hip arthroplasty Right 2008  . Total knee arthroplasty Left 2006  . Joint replacement    . Lung lobectomy Left ~ 2006  . Revision total hip arthroplasty Right 2008    "4-5 months after replacement" (04/21/2012)  . Knee arthroscopy Bilateral 1980's/1990's  . Lung lobectomy    . Metatarsal osteotomy  10/29/2011    Procedure: METATARSAL OSTEOTOMY;  Surgeon: Nadara Mustard, MD;  Location: [facility] OR;  Service: Orthopedics;  Laterality: Left;  Left 1st Metatarsal Dorsal Closing Wedge   . Total knee arthroplasty Right 04/20/2012  . Total knee arthroplasty Right 04/20/2012    Procedure: TOTAL  KNEE ARTHROPLASTY;  Surgeon: Nadara Mustard, MD;  Location[facility] OR;  Service: Orthopedics;  Laterality: Right;  Right Total Knee Arthroplasty    Family History: History reviewed. No pertinent family history.  Social History:  reports that he has been smoking Cigarettes.  He has a 45 pack-year smoking history. He has never used smokeless tobacco. He reports that he drinks alcohol. He reports that he uses illicit drugs (Marijuana and Cocaine).  Additional Social History:  Alcohol / Drug Use Pain Medications: SEE MAR Prescriptions: SEE MAR Over the Counter: SEE MAR History of alcohol / drug use?: Yes Longest period of sobriety (when/how long): 5 yrs  Negative Consequences of Use: Financial;Personal relationships Withdrawal  Symptoms: Tremors Substance #1 Name of Substance 1: Alcohol  1 - Age of First Use: 47 yrs old  1 - Amount (size/oz): 1 case of beer daily (24 cans of beer) 1 - Frequency: daily  1 - Duration: Started back using daily in September 2014 1 - Last Use / Amount: noon today 12/02/2012 Substance #2 Name of Substance 2: Cocaine  2 - Age of First Use: 16 yrs daily  2 - Amount (size/oz): $120 per use  2 - Frequency: 3x's per week  2 - Duration: 2 weeks  2 - Last Use / Amount: 2 nights ago   CIWA: CIWA-Ar BP: 135/90 mmHg Pulse Rate: 98 Nausea and Vomiting: no nausea and no vomiting Tactile Disturbances: very mild itching, pins and needles, burning or numbness Tremor: not visible, but can be felt fingertip to fingertip Auditory Disturbances: not present Paroxysmal Sweats: no sweat visible Visual Disturbances: not present Anxiety: two Headache, Fullness in Head: very mild Agitation: two Orientation and Clouding of Sensorium: oriented and can do serial additions CIWA-Ar Total: 7 COWS:    Allergies:  Allergies  Allergen Reactions  . Benadryl [Diphenhydramine Hcl] Other (See Comments)    Pt states leg spasm  . Trazodone And Nefazodone Other (See Comments)    Leg Spasms.     Home Medications:  (Not in a hospital admission)  OB/GYN Status:  No LMP for male patient.  General Assessment Data Location of Assessment: [facility] Is this a Tele or Face-to-Face Assessment?: Tele Assessment Living Arrangements: Alone Can pt return to current living arrangement?: Yes Admission Status: Voluntary Is patient capable of signing voluntary admission?: Yes Transfer from: Acute Hospital Referral Source: Self/Family/Friend  Medical Screening Exam [facility] Walk-in ONLY) Medical Exam completed: No Reason for MSE not completed: Other: (patient is in the ED at [facility])  [facility] Crisis Care Plan Living Arrangements: Alone Name of Psychiatrist:  ([facility] in [place] ) Name of Therapist:  (none  reported)  Education Status Is patient currently in school?: No  Risk to self Suicidal Ideation: No Suicidal Intent: No Is patient at risk for suicide?: No Suicidal Plan?: No Access to Means: No What has been your use of drugs/alcohol within the last 12 months?:  (cocaine and alcohol use ) Previous Attempts/Gestures: No How many times?:  (0) Other Self Harm Risks:  (none reported) Triggers for Past Attempts: Other (Comment) (no previous attempts or gestures) Intentional Self Injurious Behavior: None Family Suicide History: Yes (father-alcoholism) Recent stressful life event(s): Other (Comment) (loneliness ) Persecutory voices/beliefs?: No Depression: Yes Depression Symptoms: Feeling worthless/self pity;Fatigue;Insomnia Substance abuse history and/or treatment for substance abuse?: No Suicide prevention information given to non-admitted patients: Not applicable  Risk to Others Homicidal Ideation: No Thoughts of Harm to Others: No Current Homicidal Intent: No Current Homicidal Plan: No Access to  Homicidal Means: No Identified Victim:  (n/a) History of harm to others?: No Assessment of Violence: None Noted Violent Behavior Description:  (patient calm and cooperatve) Does patient have access to weapons?: No Criminal Charges Pending?: No (obtained a DUI 2 yrs ago; no current charges) Does patient have a court date: No  Psychosis Hallucinations: None noted Delusions: None noted  Mental Status Report Appear/Hygiene: Disheveled Eye Contact: Fair Motor Activity: Freedom of movement Speech: Logical/coherent Level of Consciousness: Alert Mood: Depressed Affect: Appropriate to circumstance Anxiety Level: Severe Thought Processes: Coherent;Relevant Judgement: Unimpaired Orientation: Person;Place;Time;Situation Obsessive Compulsive Thoughts/Behaviors: None  Cognitive Functioning Concentration: Decreased Memory: Recent Intact;Remote Intact IQ: Average Insight:  Fair Impulse Control: Fair Appetite: Fair Weight Loss:  (none reported) Weight Gain:  (none reported) Sleep: Decreased Total Hours of Sleep:  (varies-4-6 hrs ) Vegetative Symptoms: None  ADLScreening [facility] Assessment Services) Patient's cognitive ability adequate to safely complete daily activities?: Yes Patient able to express need for assistance with ADLs?: Yes Independently performs ADLs?: Yes (appropriate for developmental age)  Prior Inpatient Therapy Prior Inpatient Therapy: Yes Prior Therapy Dates:  [facility]- March 2014; [facility]-last yr for 2 weeks ) Prior Therapy Facilty/Provider(s):  [facility] and [facility] ) Reason for Treatment:  (depression )  Prior Outpatient Therapy Prior Outpatient Therapy: Yes Prior Therapy Dates:  ([facility] in [place] (lat appt. April 2014)) Prior Therapy Facilty/Provider(s):  ([facility] in [place] ) Reason for Treatment:  (med mgt)  ADL Screening (condition at time of admission) Patient's cognitive ability adequate to safely complete daily activities?: Yes Is the patient deaf or have difficulty hearing?: No Does the patient have difficulty seeing, even when wearing glasses/contacts?: No Does the patient have difficulty concentrating, remembering, or making decisions?: No Patient able to express need for assistance with ADLs?: Yes Does the patient have difficulty dressing or bathing?: No Independently performs ADLs?: Yes (appropriate for developmental age) Does the patient have difficulty walking or climbing stairs?: No Weakness of Legs: None Weakness of Arms/Hands: None  Home Assistive Devices/Equipment Home Assistive Devices/Equipment: None    Abuse/Neglect Assessment (Assessment to be complete while patient is alone) Physical Abuse: Denies Verbal Abuse: Denies Sexual Abuse: Denies Exploitation of patient/patient's resources: Denies Self-Neglect: Denies Values / Beliefs Cultural Requests During Hospitalization: None Spiritual Requests During  Hospitalization: None   Advance Directives (For Healthcare) Advance Directive: Patient does not have advance directive Nutrition Screen- [facility] Patient's home diet: Regular  Additional Information 1:1 In Past 12 Months?: No CIRT Risk: No Elopement Risk: No Does patient have medical clearance?: Yes     Disposition:  Disposition Initial Assessment Completed for this Encounter: Yes Disposition of Patient: Referred to;Inpatient treatment program (Pending accepted to [facility] for detox) Type of inpatient treatment program: Adult Patient referred to: Other (Comment) [facility] for inpatient treatment )  On Site Evaluation by:   Reviewed with Physician:    Melynda Ripple [facility] 12/02/2012 3:14 PM

## 2012-12-02 NOTE — ED Notes (Signed)
[facility] called states that the pt has been accepted to their hospital.  Accepting Dr Dub Mikes.  Faxed Voluntary form.

## 2012-12-02 NOTE — ED Notes (Signed)
Pt requesting Ativan.  Pt reports that he is very anxious.

## 2012-12-02 NOTE — ED Notes (Signed)
Pt requesting Nicotine patch.  Notified edp

## 2012-12-02 NOTE — ED Provider Notes (Signed)
CSN: [id number]     Arrival date & time 12/02/12  1136 History   This chart was scribed for Glynn Octave, MD by Bennett Scrape, ED Scribe. This patient was seen in room [facility] and the patient's care was started at 1:23 PM.     Chief Complaint  Patient presents with  . V70.1   The history is provided by the patient. No language interpreter was used.    HPI Comments: [name] is a 47 y.o. male who presents to the Emergency Department requesting detox from cocaine and alcohol. He states that he snorts cocaine and smokes marijuana when he can afford it but drinks 24 beers daily. He denies any IV drug use. Last beer was PTA. He was sober for one year but relasoped in September 2014. He states that since then he has consumed 24 beers daily. He admits that he feels "shaky" if he doesn't get alcohol to drink. He denies SI or HI. He also briefly mentions 3 days of epistaxis episodes but denies any fevers or emesis or abdominal pain. He came into the ED on his own accord. He has been seen at [facility] and would like placement there if possible. He is also borderline diabetic with an ongoing abscess to the bottom of the left foot but denies any symptoms currently.    Past Medical History  Diagnosis Date  . Diabetic foot ulcer   . ETOH abuse   . Anxiety   . Open wound     bottom of foot  . Diabetes mellitus without complication     borderline  . Neuromuscular disorder     neuropathy  . GERD (gastroesophageal reflux disease)     tums  . Pneumonia ~ 2012  . History of blood transfusion     "related to left knee OR; probably right hip too" (04/21/2012)  . Stroke 2008    "they said I might have had one during right hip replacement" (04/21/2012)  . Arthritis     "everywhere" (04/21/2012)  . Mental disorder   . Depression   . DDD (degenerative disc disease)    Past Surgical History  Procedure Laterality Date  . Total hip arthroplasty Right 2008  . Total knee arthroplasty Left 2006  .  Joint replacement    . Lung lobectomy Left ~ 2006  . Revision total hip arthroplasty Right 2008    "4-5 months after replacement" (04/21/2012)  . Knee arthroscopy Bilateral 1980's/1990's  . Lung lobectomy    . Metatarsal osteotomy  10/29/2011    Procedure: METATARSAL OSTEOTOMY;  Surgeon: Nadara Mustard, MD;  Location: [facility] OR;  Service: Orthopedics;  Laterality: Left;  Left 1st Metatarsal Dorsal Closing Wedge   . Total knee arthroplasty Right 04/20/2012  . Total knee arthroplasty Right 04/20/2012    Procedure: TOTAL KNEE ARTHROPLASTY;  Surgeon: Nadara Mustard, MD;  Location: [facility] OR;  Service: Orthopedics;  Laterality: Right;  Right Total Knee Arthroplasty   History reviewed. No pertinent family history. History  Substance Use Topics  . Smoking status: Current Every Day Smoker -- 1.50 packs/day for 30 years    Types: Cigarettes  . Smokeless tobacco: Never Used     Comment: 04/21/2012 offered smoking cessation materials; pt declines  . Alcohol Use: 0.0 oz/week     Comment: 04/21/2012 "last alcohol ~ 1 1/2 months ago; hx. alcohol abuse in past"    Review of Systems  A complete 10 system review of systems was obtained and all systems are  negative except as noted in the HPI and PMH.   Allergies  Benadryl and Trazodone and nefazodone  Home Medications   Current Outpatient Rx  Name  Route  Sig  Dispense  Refill  . ibuprofen (ADVIL,MOTRIN) 200 MG tablet   Oral   Take 800 mg by mouth every 6 (six) hours as needed for pain.          Triage Vitals: BP 135/90  Pulse 98  Temp(Src) 97.9 F (36.6 C) (Oral)  Resp 20  Ht 6\' 3"  (1.905 m)  Wt 180 lb (81.647 kg)  BMI 22.50 kg/m2  SpO2 100%  Physical Exam  Nursing note and vitals reviewed. Constitutional: He is oriented to person, place, and time. He appears well-developed and well-nourished. No distress.  HENT:  Head: Normocephalic and atraumatic.  Eyes: EOM are normal.  Neck: Neck supple. No tracheal deviation present.  Cardiovascular:  Regular rhythm.  Tachycardia present.   Pulmonary/Chest: Effort normal and breath sounds normal. No respiratory distress.  Abdominal: Soft. There is no tenderness.  Musculoskeletal: Normal range of motion.  Callous to the bottom of the left foot   Neurological: He is alert and oriented to person, place, and time.  No tremors. No ataxia on finger to nose, 5/5 strength throughout   Skin: Skin is warm and dry.  Psychiatric: His behavior is normal. His mood appears anxious.    ED Course  Procedures (including critical care time)  Medications  LORazepam (ATIVAN) tablet 0-4 mg (1 mg Oral Given 12/02/12 1455)    Followed by  LORazepam (ATIVAN) tablet 0-4 mg (not administered)  thiamine (VITAMIN B-1) tablet 100 mg (100 mg Oral Given 12/02/12 1632)    Or  thiamine (B-1) injection 100 mg ( Intravenous See Alternative 12/02/12 1632)  nicotine (NICODERM CQ - dosed in mg/24 hours) patch 21 mg (21 mg Transdermal Patch Applied 12/02/12 1454)  LORazepam (ATIVAN) tablet 1 mg (1 mg Oral Given 12/02/12 1632)  LORazepam (ATIVAN) injection 2 mg (2 mg Intramuscular Given 12/02/12 1734)    DIAGNOSTIC STUDIES: Oxygen Saturation is 100% on room air, normal by my interpretation.    COORDINATION OF CARE: 1:26 PM-Discussed treatment plan which includes medications, CBC panel, BMP and ethanol level with pt at bedside and pt agreed to plan.   Labs Review Labs Reviewed  CBC WITH DIFFERENTIAL - Abnormal; Notable for the following:    RBC 6.00 (*)    MCV 74.5 (*)    MCH 24.3 (*)    RDW 19.0 (*)    All other components within normal limits  ETHANOL - Abnormal; Notable for the following:    Alcohol, Ethyl (B) 136 (*)    All other components within normal limits  BASIC METABOLIC PANEL  URINE RAPID DRUG SCREEN (HOSP PERFORMED)   Imaging Review No results found.  EKG Interpretation   None       MDM   1. Alcohol abuse    Alcoholic drinking 24 beers a day requesting detox. Also cocaine abuse. Last  drink at noon today.  We'll obtain screening labs, alcohol withdrawal protocol. No tachycardia or hypertension. No evidence of active withdrawal.  Screening labs at baseline. CIWA 7.  Patient accepted to [facility] by Dr. Dub Mikes.  HR 105 BP 134/90   I personally performed the services described in this documentation, which was scribed in my presence. The recorded information has been reviewed and is accurate.    Glynn Octave, MD 12/02/12 [phone]

## 2012-12-02 NOTE — Progress Notes (Signed)
47 year old male pt admitted on voluntary basis. Pt reports that he was brought into ED by parents as he had been drinking for the past 2 months after being sober since March and realized that he needed to stop. Pt believes that he started drinking again as his son recently got married and moved out and feels that he is empty nesting and has too much free time on his hands. Pt reports being on disability. Pt does have artificial hips and knees and diabetic ulcer on left foot. Pt did mention that he has neuropathy, he does have a PCP but does not see on a regular basis. Pt denies any SI on admission and is able to contract for safety on the unit. Pt is a high fall risk and unsteady on his feet and was given shoes with shoe laces to help with balance. Pt was oriented to the unit and safety maintained.

## 2012-12-02 NOTE — ED Notes (Signed)
Pt reports "my ativan isn't working".  Notified edp

## 2012-12-03 ENCOUNTER — Encounter (HOSPITAL_COMMUNITY): Payer: Self-pay | Admitting: Psychiatry

## 2012-12-03 DIAGNOSIS — F1010 Alcohol abuse, uncomplicated: Secondary | ICD-10-CM

## 2012-12-03 DIAGNOSIS — F1994 Other psychoactive substance use, unspecified with psychoactive substance-induced mood disorder: Secondary | ICD-10-CM

## 2012-12-03 DIAGNOSIS — F1910 Other psychoactive substance abuse, uncomplicated: Secondary | ICD-10-CM

## 2012-12-03 DIAGNOSIS — F320 Major depressive disorder, single episode, mild: Secondary | ICD-10-CM

## 2012-12-03 LAB — HEPATIC FUNCTION PANEL
AST: 22 U/L (ref 0–37)
Albumin: 3.6 g/dL (ref 3.5–5.2)
Indirect Bilirubin: 0.4 mg/dL (ref 0.3–0.9)
Total Bilirubin: 0.6 mg/dL (ref 0.3–1.2)
Total Protein: 7 g/dL (ref 6.0–8.3)

## 2012-12-03 MED ORDER — NICOTINE 21 MG/24HR TD PT24
21.0000 mg | MEDICATED_PATCH | Freq: Every day | TRANSDERMAL | Status: DC
  Administered 2012-12-03 – 2012-12-07 (×5): 21 mg via TRANSDERMAL
  Filled 2012-12-03 (×5): qty 1

## 2012-12-03 MED ORDER — METHOCARBAMOL 500 MG PO TABS
500.0000 mg | ORAL_TABLET | Freq: Three times a day (TID) | ORAL | Status: DC
  Administered 2012-12-03 – 2012-12-07 (×13): 500 mg via ORAL
  Filled 2012-12-03 (×16): qty 1

## 2012-12-03 MED ORDER — GABAPENTIN 100 MG PO CAPS
200.0000 mg | ORAL_CAPSULE | Freq: Three times a day (TID) | ORAL | Status: DC
  Administered 2012-12-03 – 2012-12-05 (×6): 200 mg via ORAL
  Filled 2012-12-03 (×10): qty 2

## 2012-12-03 MED ORDER — NABUMETONE 500 MG PO TABS
500.0000 mg | ORAL_TABLET | Freq: Two times a day (BID) | ORAL | Status: DC
  Administered 2012-12-03 – 2012-12-07 (×7): 500 mg via ORAL
  Filled 2012-12-03 (×10): qty 1

## 2012-12-03 MED ORDER — ESCITALOPRAM OXALATE 10 MG PO TABS
10.0000 mg | ORAL_TABLET | Freq: Every day | ORAL | Status: DC
  Administered 2012-12-03 – 2012-12-07 (×5): 10 mg via ORAL
  Filled 2012-12-03 (×7): qty 1

## 2012-12-03 MED ORDER — PANTOPRAZOLE SODIUM 40 MG PO TBEC
40.0000 mg | DELAYED_RELEASE_TABLET | Freq: Two times a day (BID) | ORAL | Status: DC
  Administered 2012-12-03 – 2012-12-07 (×8): 40 mg via ORAL
  Filled 2012-12-03 (×11): qty 1

## 2012-12-03 NOTE — H&P (Signed)
Psychiatric Admission Assessment Adult  Patient Identification:  [name] Date of Evaluation:  12/03/2012 Chief Complaint:  Alcohol Dependence, cocaine abuse,  depressive disorder NOS History of Present Illness:  47 y.o. male who presents to the Emergency Department requesting detox from cocaine and alcohol. He states that he snorts cocaine and smokes marijuana when he can afford it but drinks 24 beers daily. He denies any IV drug use. Last beer was PTA. He was sober for one year but relasoped in September 2014. He states that since then he has consumed 24 beers daily. He admits that he feels "shaky" if he doesn't get alcohol to drink. He denies SI or HI. He also briefly mentions 3 days of epistaxis episodes but denies any fevers or emesis or abdominal pain. He came into the ED on his own accord. He has been seen at [facility] and would like placement there if possible. He is also borderline diabetic with an ongoing abscess to the bottom of the left foot but denies any symptoms currently.   Elements:  Location:  generalized. Quality:  acute. Severity:  severe. Timing:  constant. Duration:  2 months. Context:  stressors. Associated Signs/Synptoms: Depression Symptoms:  depressed mood, feelings of worthlessness/guilt, difficulty concentrating, anxiety, (Hypo) Manic Symptoms:  Denies Anxiety Symptoms:  Excessive Worry, Psychotic Symptoms:  None PTSD Symptoms:  None  Psychiatric Specialty Exam: Physical Exam  Constitutional: He is oriented to person, place, and time. He appears well-developed and well-nourished.  HENT:  Head: Normocephalic and atraumatic.  Neck: Normal range of motion.  Respiratory: Effort normal.  Musculoskeletal: Normal range of motion.  Neurological: He is alert and oriented to person, place, and time.   Completed, reviewed, concur with findings  Review of Systems  Constitutional: Negative.   HENT: Negative.   Eyes: Negative.   Respiratory: Negative.    Cardiovascular: Negative.   Gastrointestinal: Negative.   Genitourinary: Negative.   Musculoskeletal: Negative.   Skin: Negative.   Neurological: Negative.   Endo/Heme/Allergies: Negative.   Psychiatric/Behavioral: Positive for substance abuse.    Blood pressure 142/85, pulse 109, temperature 97.6 F (36.4 C), temperature source Oral, resp. rate 18, height 6\' 3"  (1.905 m), weight 87.998 kg (194 lb).Body mass index is 24.25 kg/(m^2).  General Appearance: Casual  Eye Contact::  Fair  Speech:  Normal Rate  Volume:  Normal  Mood:  Anxious and Depressed  Affect:  Congruent  Thought Process:  Coherent  Orientation:  Full (Time, Place, and Person)  Thought Content:  WDL  Suicidal Thoughts:  No  Homicidal Thoughts:  No  Memory:  Immediate;   Fair Recent;   Fair Remote;   Fair  Judgement:  Poor  Insight:  Fair  Psychomotor Activity:  Decreased  Concentration:  Fair  Recall:  Fair  Akathisia:  No  Handed:  Right  AIMS (if indicated):     Assets:  Resilience  Sleep:  Number of Hours: 6.25    Past Psychiatric History: Diagnosis:  Alcohol dependency  Hospitalizations:  [facility]  Outpatient Care:  [facility]  Substance Abuse Care:  [facility]  Self-Mutilation:  None  Suicidal Attempts:  None  Violent Behaviors:  None   Past Medical History:   Past Medical History  Diagnosis Date  . Diabetic foot ulcer   . ETOH abuse   . Anxiety   . Open wound     bottom of foot  . Diabetes mellitus without complication     borderline  . Neuromuscular disorder     neuropathy  .  GERD (gastroesophageal reflux disease)     tums  . Pneumonia ~ 2012  . History of blood transfusion     "related to left knee OR; probably right hip too" (04/21/2012)  . Stroke 2008    "they said I might have had one during right hip replacement" (04/21/2012)  . Arthritis     "everywhere" (04/21/2012)  . Mental disorder   . Depression   . DDD (degenerative disc disease)    None. Allergies:   Allergies  Allergen  Reactions  . Benadryl [Diphenhydramine Hcl] Other (See Comments)    Pt states leg spasm  . Trazodone And Nefazodone Other (See Comments)    Leg Spasms.    PTA Medications: Prescriptions prior to admission  Medication Sig Dispense Refill  . ibuprofen (ADVIL,MOTRIN) 200 MG tablet Take 800 mg by mouth every 6 (six) hours as needed for pain.        Previous Psychotropic Medications:  Medication/Dose   Seroquel   Substance Abuse History in the last 12 months:  yes  Consequences of Substance Abuse: Legal Consequences:  DWI  Social History:  reports that he has been smoking Cigarettes.  He has a 45 pack-year smoking history. He has never used smokeless tobacco. He reports that he drinks alcohol. He reports that he uses illicit drugs (Marijuana and Cocaine). Additional Social History:  Current Place of Residence:   Place of Birth:   Family Members: Marital Status:  Divorced Children:  Sons:  30 yo  Daughters: Relationships: Education:  Corporate treasurer Problems/Performance: Religious Beliefs/Practices: History of Abuse (Emotional/Phsycial/Sexual): None Teacher, music History:  None. Legal History: Hobbies/Interests:  Family History:  History reviewed. No pertinent family history.  Results for orders placed during the hospital encounter of 12/02/12 (from the past 72 hour(s))  HEPATIC FUNCTION PANEL     Status: None   Collection Time    12/03/12  7:14 AM      Result Value Range   Total Protein 7.0  6.0 - 8.3 g/dL   Albumin 3.6  3.5 - 5.2 g/dL   AST 22  0 - 37 U/L   ALT 30  0 - 53 U/L   Alkaline Phosphatase 98  39 - 117 U/L   Total Bilirubin 0.6  0.3 - 1.2 mg/dL   Bilirubin, Direct 0.2  0.0 - 0.3 mg/dL   Indirect Bilirubin 0.4  0.3 - 0.9 mg/dL   Comment: Performed at [facility]   Psychological Evaluations:  Assessment:   DSM5:  Substance/Addictive Disorders:  Alcohol Related Disorder - Severe (303.90), Alcohol  Intoxication with Use Disorder - Severe (F10.229) and Cannabis Use Disorder - Moderate 9304.30) Depressive Disorders:  Major Depressive Disorder - Mild (296.21)  AXIS I:  Alcohol Abuse, Substance Abuse and Substance Induced Mood Disorder AXIS II:  Deferred AXIS III:   Past Medical History  Diagnosis Date  . Diabetic foot ulcer   . ETOH abuse   . Anxiety   . Open wound     bottom of foot  . Diabetes mellitus without complication     borderline  . Neuromuscular disorder     neuropathy  . GERD (gastroesophageal reflux disease)     tums  . Pneumonia ~ 2012  . History of blood transfusion     "related to left knee OR; probably right hip too" (04/21/2012)  . Stroke 2008    "they said I might have had one during right hip replacement" (04/21/2012)  . Arthritis     "everywhere" (  04/21/2012)  . Mental disorder   . Depression   . DDD (degenerative disc disease)    AXIS IV:  economic problems, other psychosocial or environmental problems, problems related to social environment and problems with primary support group AXIS V:  41-50 serious symptoms  Treatment Plan/Recommendations:  Plan:  Review of chart, vital signs, medications, and notes. 1-Admit for crisis management and stabilization.  Estimated length of stay 5-7 days past his current stay of 1 2-Individual and group therapy encouraged 3-Medication management for depression, alcohol withdrawal/detox and anxiety to reduce current symptoms to base line and improve the patient's overall level of functioning:  Medications reviewed with the patient and she stated no untoward effects, home medications in place and Librium protocol started 4-Coping skills for depression, substance abuse, and anxiety developing-- 5-Continue crisis stabilization and management 6-Address health issues--monitoring vital signs, stable  7-Treatment plan in progress to prevent relapse of depression, substance abuse, and anxiety 8-Psychosocial education regarding  relapse prevention and self-care 8-Health care follow up as needed for any health concerns  9-Call for consult with hospitalist for additional specialty patient services as needed.  Treatment Plan Summary: Daily contact with patient to assess and evaluate symptoms and progress in treatment Medication management Current Medications:  Current Facility-Administered Medications  Medication Dose Route Frequency Provider Last Rate Last Dose  . acetaminophen (TYLENOL) tablet 650 mg  650 mg Oral Q6H PRN Fransisca Kaufmann, NP      . alum & mag hydroxide-simeth (MAALOX/MYLANTA) 200-200-20 MG/5ML suspension 30 mL  30 mL Oral Q4H PRN Fransisca Kaufmann, NP      . chlordiazePOXIDE (LIBRIUM) capsule 25 mg  25 mg Oral Q6H PRN Fransisca Kaufmann, NP   25 mg at 12/02/12 2237  . chlordiazePOXIDE (LIBRIUM) capsule 25 mg  25 mg Oral QID Fransisca Kaufmann, NP   25 mg at 12/03/12 0802   Followed by  . [START ON 12/04/2012] chlordiazePOXIDE (LIBRIUM) capsule 25 mg  25 mg Oral TID Fransisca Kaufmann, NP       Followed by  . [START ON 12/05/2012] chlordiazePOXIDE (LIBRIUM) capsule 25 mg  25 mg Oral BH-qamhs Fransisca Kaufmann, NP       Followed by  . [START ON 12/07/2012] chlordiazePOXIDE (LIBRIUM) capsule 25 mg  25 mg Oral Daily Fransisca Kaufmann, NP      . hydrOXYzine (ATARAX/VISTARIL) tablet 25 mg  25 mg Oral Q6H PRN Fransisca Kaufmann, NP   25 mg at 12/02/12 2238  . ibuprofen (ADVIL,MOTRIN) tablet 800 mg  800 mg Oral Q6H PRN Kristeen Mans, NP   800 mg at 12/03/12 0801  . loperamide (IMODIUM) capsule 2-4 mg  2-4 mg Oral PRN Fransisca Kaufmann, NP      . magnesium hydroxide (MILK OF MAGNESIA) suspension 30 mL  30 mL Oral Daily PRN Fransisca Kaufmann, NP      . mirtazapine (REMERON) tablet 7.5 mg  7.5 mg Oral QHS Fransisca Kaufmann, NP   7.5 mg at 12/02/12 2200  . multivitamin with minerals tablet 1 tablet  1 tablet Oral Daily Fransisca Kaufmann, NP   1 tablet at 12/03/12 0801  . ondansetron (ZOFRAN-ODT) disintegrating tablet 4 mg  4 mg Oral Q6H PRN Fransisca Kaufmann, NP      . QUEtiapine  (SEROQUEL) tablet 100 mg  100 mg Oral QHS Kristeen Mans, NP   100 mg at 12/02/12 2237  . thiamine (VITAMIN B-1) tablet 100 mg  100 mg Oral Daily Fransisca Kaufmann, NP   100 mg at 12/03/12 4696  Observation Level/Precautions:  15 minute checks  Laboratory:  Completed, reviewed, stable  Psychotherapy:  Individual and group therapy  Medications:  Librium protocol, Seroquel  Consultations:  None  Discharge Concerns:  None    Estimated LOS:  5-7 days  Other:     I certify that inpatient services furnished can reasonably be expected to improve the patient's condition.    Nanine Means, PMH-NP 11/15/201410:26 AM  Patient was seen for psychiatric assessment, suicide risk assessment, made treatment plan,case discussed with the physician extender and reviewed the information documented and agree with the treatment plan.   Nyja Westbrook,JANARDHAHA R. 12/03/2012 12:28 PM

## 2012-12-03 NOTE — Progress Notes (Signed)
Pt attended AA group 

## 2012-12-03 NOTE — Progress Notes (Addendum)
Spoke with pt 1:1. He reports feeling shaky, anxious. Continues to complain of chronic leg pain for which he has been receiving motrin. He is pleasant, appropriate, disheveled. CIWA is a 4. BP slightly elevated. Support given. Medicated with librium per protocol. Fall precautions in place and reviewed with pt who verbalizes understanding. Discussed available prn pain med options and pt aware that he cannot have motrin until 2300. States he will come back if he is still awake and experiencing pain. Denies any SI/HI/aVH and remains safe. Currently watching TV in dayroom with peers. Lawrence Marseilles

## 2012-12-03 NOTE — B[facility] Counselor (Signed)
Adult Psychosocial Assessment Update Interdisciplinary Team  Previous [facility] admissions/discharges:  Admissions Discharges  Date: 03/28/12 Date: 04/01/12  Date: 02/09/12 Date: 02/12/12  Date: 08/02/11 Date: 7.19.13  Date: 05/31/11 Date: 06/03/11  Date: 02/08/11 Date: 1.24/13   Changes since the last Psychosocial Assessment (including adherence to outpatient mental health and/or substance abuse treatment, situational issues contributing to decompensation and/or relapse). Patient reports he remained sober upon discharge in March until late September by  Attending AA meetings and working with a sponsor.  Unfortunately the patient's medica-  tions were changed upon discharge at [facility] and quit following up there. Patient  Reports one trigger for relapse was isolation as [place] area, does not have   Driver's license and son had gotten married earlier in September and moved out of   Patient's home. Patient currently drinking case daily and has used cocaine 5-6 times in last few months; three of those times were last week.  Additionally patient reports that he relapsed along with AA sponsor; patient willing to return to AA although transportation will be an issue and willing to get new sponsor.    Discharge Plan 1. Will you be returning to the same living situation after discharge?   Yes: X No:      If no, what is your plan?           2. Would you like a referral for services when you are discharged? Yes:  X  If yes, for what services?  No:        Patient lives in [place] and requests referral to somewhere other than [facility]       Summary and Recommendations (to be completed by the evaluator) Patient is a 47 YO sindgle disabled Caucasian male admitted with diagnosis of   Alcohol Dependence; Cocaine Abuse; Depressive Disorder NOS.  Patient will benefit  From crisis stabilization, medication evaluation, group therapy and psychoeducation in  Addition to discharge  Planning.                 Signature:  Clide Dales, 12/03/2012 9:39 AM

## 2012-12-03 NOTE — Progress Notes (Signed)
D- [name] is out on the milieu interacting with peers and attending groups.  Rates depression at 8 and hopelessness at 5. C/O "cravings" and "agitation" r/t withdrawal.  Compliant with scheduled medications.  Some tremors observed.  A- Support and encoragement offered.  12 step concepts reenforced.  Continue current POC and evaluation of treatment goals.  Continue 15' checks for safety.  R-Safety maintained.

## 2012-12-03 NOTE — [facility] Suicide Risk Assessment (Signed)
Suicide Risk Assessment  Admission Assessment     Nursing information obtained from:    Demographic factors:    Current Mental Status:    Loss Factors:    Historical Factors:    Risk Reduction Factors:     CLINICAL FACTORS:   Depression:   Anhedonia Comorbid alcohol abuse/dependence Hopelessness Impulsivity Insomnia Recent sense of peace/wellbeing Severe Alcohol/Substance Abuse/Dependencies Unstable or Poor Therapeutic Relationship Previous Psychiatric Diagnoses and Treatments Medical Diagnoses and Treatments/Surgeries  COGNITIVE FEATURES THAT CONTRIBUTE TO RISK:  Closed-mindedness Loss of executive function Polarized thinking Thought constriction (tunnel vision)    SUICIDE RISK:   Minimal: No identifiable suicidal ideation.  Patients presenting with no risk factors but with morbid ruminations; may be classified as minimal risk based on the severity of the depressive symptoms  PLAN OF CARE: Patient was admitted for the alcohol detox treatment and will receive the appropriate treatment and case management.  I certify that inpatient services furnished can reasonably be expected to improve the patient's condition.  Jenetta Wease,JANARDHAHA R. 12/03/2012, 1:33 PM

## 2012-12-03 NOTE — [facility] Group Notes (Signed)
[facility] Group Notes:  (Clinical Social Work)  12/03/2012     10-11AM  Summary of Progress/Problems:   The main focus of today's process group was for the patient to identify ways in which they have in the past sabotaged their own recovery. Motivational Interviewing was utilized to ask the group members what they get out of their substance use, and what reasons they may have for wanting to change.  The Stages of Change were explained using a handout, and patients identified where they currently are with regard to stages of change.  The patient expressed that he was sober from March to mid-September.  At that time, he ran into friends at the store, and he was bored, so told himself "I can drink a couple."  This self sabotage led him to drink more and more until he was ultimately forced to admit himself to the hospital.  He wants to stop drinking alcohol because he wants the opportunity to once again spend time around his family.  He feels he is in the Preparation Stage of Change.  Type of Therapy:  Group Therapy - Process   Participation Level:  Active  Participation Quality:  Attentive and Sharing  Affect:  Appropriate  Cognitive:  Appropriate and Oriented  Insight:  Engaged  Engagement in Therapy:  Engaged  Modes of Intervention:  Education, Teacher, English as a foreign language, Motivational Interviewing  Ambrose Mantle, LCSW 12/03/2012, 12:02 PM

## 2012-12-04 DIAGNOSIS — F411 Generalized anxiety disorder: Secondary | ICD-10-CM

## 2012-12-04 DIAGNOSIS — F329 Major depressive disorder, single episode, unspecified: Secondary | ICD-10-CM

## 2012-12-04 NOTE — [facility] Group Notes (Addendum)
[facility] Group Notes:  (Nursing/MHT/Case Management/Adjunct)  Date:  12/04/2012  Time:  1300  Type of Therapy:  Nurse Education  Participation Level:  good  Participation Quality:  engaged  Affect:  appropriate  Cognitive:  alert  Insight:  improving  Engagement in Group:  engaged  Modes of Intervention:  Education/limit-setting  Summary of Progress/Problems:  [name] 12/04/2012, 4:17 PM

## 2012-12-04 NOTE — Progress Notes (Signed)
Found pt pacing in hallway. Pleasant on approach but anxious. Asking for librium. States overall he is doing better. CIWA scored at a 4. Will obtain VS after AA. Medicated with prn librium, support given. No SI/HI/AVH voiced and pt safe. Lawrence Marseilles

## 2012-12-04 NOTE — Progress Notes (Signed)
[facility] MD Progress Note  12/04/2012 12:24 PM [name]  MRN:  [id number] Subjective:  Patient's sleep "fair", appetite "good", depression "slightly", denies suicidal/homicidal ideations and hallucinations, patient does not feel he is "ready to go yet"--Librium protocol still in progress. Diagnosis:   DSM5:  Substance/Addictive Disorders:  Alcohol Related Disorder - Severe (303.90) and Alcohol Intoxication without Use Disorder (W09.811) Depressive Disorders:  Major Depressive Disorder - Moderate (296.22)  Axis I: Alcohol Abuse, Anxiety Disorder NOS and Depressive Disorder NOS Axis II: Deferred Axis III:  Past Medical History  Diagnosis Date  . Diabetic foot ulcer   . ETOH abuse   . Anxiety   . Open wound     bottom of foot  . Diabetes mellitus without complication     borderline  . Neuromuscular disorder     neuropathy  . GERD (gastroesophageal reflux disease)     tums  . Pneumonia ~ 2012  . History of blood transfusion     "related to left knee OR; probably right hip too" (04/21/2012)  . Stroke 2008    "they said I might have had one during right hip replacement" (04/21/2012)  . Arthritis     "everywhere" (04/21/2012)  . Mental disorder   . Depression   . DDD (degenerative disc disease)    Axis IV: other psychosocial or environmental problems, problems related to social environment and problems with primary support group Axis V: 41-50 serious symptoms  ADL's:  Intact  Sleep: Fair  Appetite:  Good  Suicidal Ideation:  Denies Homicidal Ideation:  Denies  Psychiatric Specialty Exam: Review of Systems  Constitutional: Negative.   HENT: Negative.   Eyes: Negative.   Respiratory: Negative.   Cardiovascular: Negative.   Gastrointestinal: Negative.   Genitourinary: Negative.   Musculoskeletal: Negative.   Skin: Negative.   Neurological: Negative.   Endo/Heme/Allergies: Negative.   Psychiatric/Behavioral: Positive for depression and substance abuse. The patient is  nervous/anxious.     Blood pressure 124/85, pulse 114, temperature 98.2 F (36.8 C), temperature source Oral, resp. rate 18, height 6\' 3"  (1.905 m), weight 87.998 kg (194 lb).Body mass index is 24.25 kg/(m^2).  General Appearance: Casual  Eye Contact::  Fair  Speech:  Normal Rate  Volume:  Normal  Mood:  Anxious and Depressed  Affect:  Congruent  Thought Process:  Coherent  Orientation:  Full (Time, Place, and Person)  Thought Content:  WDL  Suicidal Thoughts:  No  Homicidal Thoughts:  No  Memory:  Immediate;   Fair Recent;   Fair Remote;   Fair  Judgement:  Poor  Insight:  Fair  Psychomotor Activity:  Decreased  Concentration:  Fair  Recall:  Fair  Akathisia:  No  Handed:  Right  AIMS (if indicated):     Assets:  Resilience  Sleep:  Number of Hours: 6.5   Current Medications: Current Facility-Administered Medications  Medication Dose Route Frequency Provider Last Rate Last Dose  . alum & mag hydroxide-simeth (MAALOX/MYLANTA) 200-200-20 MG/5ML suspension 30 mL  30 mL Oral Q4H PRN Fransisca Kaufmann, NP      . chlordiazePOXIDE (LIBRIUM) capsule 25 mg  25 mg Oral Q6H PRN Fransisca Kaufmann, NP   25 mg at 12/04/12 0616  . chlordiazePOXIDE (LIBRIUM) capsule 25 mg  25 mg Oral TID Fransisca Kaufmann, NP   25 mg at 12/04/12 1206   Followed by  . [START ON 12/05/2012] chlordiazePOXIDE (LIBRIUM) capsule 25 mg  25 mg Oral BH-qamhs Fransisca Kaufmann, NP       Followed  by  . Melene Muller ON 12/07/2012] chlordiazePOXIDE (LIBRIUM) capsule 25 mg  25 mg Oral Daily Fransisca Kaufmann, NP      . escitalopram (LEXAPRO) tablet 10 mg  10 mg Oral Daily Nanine Means, NP   10 mg at 12/04/12 0814  . gabapentin (NEURONTIN) capsule 200 mg  200 mg Oral TID Nanine Means, NP   200 mg at 12/04/12 1205  . hydrOXYzine (ATARAX/VISTARIL) tablet 25 mg  25 mg Oral Q6H PRN Fransisca Kaufmann, NP   25 mg at 12/02/12 2238  . ibuprofen (ADVIL,MOTRIN) tablet 800 mg  800 mg Oral Q6H PRN Kristeen Mans, NP   800 mg at 12/04/12 1207  . loperamide (IMODIUM) capsule  2-4 mg  2-4 mg Oral PRN Fransisca Kaufmann, NP      . magnesium hydroxide (MILK OF MAGNESIA) suspension 30 mL  30 mL Oral Daily PRN Fransisca Kaufmann, NP      . methocarbamol (ROBAXIN) tablet 500 mg  500 mg Oral TID Nanine Means, NP   500 mg at 12/04/12 1205  . multivitamin with minerals tablet 1 tablet  1 tablet Oral Daily Fransisca Kaufmann, NP   1 tablet at 12/04/12 0814  . nabumetone (RELAFEN) tablet 500 mg  500 mg Oral BID Nanine Means, NP   500 mg at 12/04/12 0813  . nicotine (NICODERM CQ - dosed in mg/24 hours) patch 21 mg  21 mg Transdermal Daily Nanine Means, NP   21 mg at 12/04/12 0836  . ondansetron (ZOFRAN-ODT) disintegrating tablet 4 mg  4 mg Oral Q6H PRN Fransisca Kaufmann, NP      . pantoprazole (PROTONIX) EC tablet 40 mg  40 mg Oral BID Nanine Means, NP   40 mg at 12/04/12 0813  . QUEtiapine (SEROQUEL) tablet 100 mg  100 mg Oral QHS Kristeen Mans, NP   100 mg at 12/03/12 2208  . thiamine (VITAMIN B-1) tablet 100 mg  100 mg Oral Daily Fransisca Kaufmann, NP   100 mg at 12/04/12 1610    Lab Results:  Results for orders placed during the hospital encounter of 12/02/12 (from the past 48 hour(s))  HEPATIC FUNCTION PANEL     Status: None   Collection Time    12/03/12  7:14 AM      Result Value Range   Total Protein 7.0  6.0 - 8.3 g/dL   Albumin 3.6  3.5 - 5.2 g/dL   AST 22  0 - 37 U/L   ALT 30  0 - 53 U/L   Alkaline Phosphatase 98  39 - 117 U/L   Total Bilirubin 0.6  0.3 - 1.2 mg/dL   Bilirubin, Direct 0.2  0.0 - 0.3 mg/dL   Indirect Bilirubin 0.4  0.3 - 0.9 mg/dL   Comment: Performed at [facility]    Physical Findings: AIMS: Facial and Oral Movements Muscles of Facial Expression: None, normal Lips and Perioral Area: None, normal Jaw: None, normal Tongue: None, normal,Extremity Movements Upper (arms, wrists, hands, fingers): None, normal Lower (legs, knees, ankles, toes): None, normal, Trunk Movements Neck, shoulders, hips: None, normal, Overall Severity Severity of abnormal  movements (highest score from questions above): None, normal Incapacitation due to abnormal movements: None, normal Patient's awareness of abnormal movements (rate only patient's report): No Awareness, Dental Status Current problems with teeth and/or dentures?: No Does patient usually wear dentures?: No  CIWA:  CIWA-Ar Total: 2 COWS:     Treatment Plan Summary: Daily contact with patient to assess and evaluate symptoms and progress  in treatment Medication management  Plan:  Review of chart, vital signs, medications, and notes. 1-Individual and group therapy 2-Medication management for depression, alcohol abuse, and anxiety:  Medications reviewed with the patient and he stated no untoward effects, no changes made 3-Coping skills for depression, anxiety, and alcohol aubse 4-Continue crisis stabilization and management 5-Address health issues--monitoring vital signs, stable 6-Treatment plan in progress to prevent relapse of depression, alcohol abuse, and anxiety  Medical Decision Making Problem Points:  Established problem, stable/improving (1) and Review of psycho-social stressors (1) Data Points:  Review of medication regiment & side effects (2)  I certify that inpatient services furnished can reasonably be expected to improve the patient's condition.   Nanine Means, PMH-NP 12/04/2012, 12:24 PM  Reviewed the information documented and agree with the treatment plan.  Davonda Ausley,JANARDHAHA R. 12/05/2012 8:36 AM

## 2012-12-04 NOTE — [facility] Group Notes (Signed)
[facility] Group Notes:  (Clinical Social Work)  12/04/2012  10:00-11:00AM  Summary of Progress/Problems:   The main focus of today's process group was to identify the patient's current support system and decide on other supports that can be put in place.  An emphasis was placed on AA/NA, support groups, therapy groups, and other professional supports in addition to natural supports.  Illustrations were used to demonstrate the need for additional supports.  A lengthy discussion ensued about unhealthy versus healthy supports.  The patient has current supports of his mother, father, sister, son and daughter-in-law and is willing to seek others, understands the importance of building system in order to preempt future crisis.  Type of Therapy:  Process Group   Participation Level:  Active  Participation Quality:  Attentive, Sharing and Supportive  Affect:  Appropriate  Cognitive:  Appropriate and Oriented  Insight:  Engaged  Engagement in Therapy:  Engaged  Modes of Intervention:   Education, Support and [employer], LCSW 12/04/2012, 12:15pm

## 2012-12-04 NOTE — Progress Notes (Signed)
D- Patient is out in milieu interacting with peers and attending groups.  Rates depression at 7 and hopelessness at 4. Affect does not match stated mood. Denies SI. Reports cravings and is compliant with librium protocol.  A- Support and encouragement given. 12 step concepts reenforced.  Continue POC and evaluation of treatment goals.  Continue 15' checks for safety.  R- safety maintained.

## 2012-12-05 DIAGNOSIS — F1020 Alcohol dependence, uncomplicated: Principal | ICD-10-CM

## 2012-12-05 DIAGNOSIS — F321 Major depressive disorder, single episode, moderate: Secondary | ICD-10-CM

## 2012-12-05 DIAGNOSIS — F411 Generalized anxiety disorder: Secondary | ICD-10-CM

## 2012-12-05 DIAGNOSIS — F410 Panic disorder [episodic paroxysmal anxiety] without agoraphobia: Secondary | ICD-10-CM

## 2012-12-05 DIAGNOSIS — F1410 Cocaine abuse, uncomplicated: Secondary | ICD-10-CM

## 2012-12-05 MED ORDER — GABAPENTIN 300 MG PO CAPS
300.0000 mg | ORAL_CAPSULE | Freq: Three times a day (TID) | ORAL | Status: DC
  Administered 2012-12-05 – 2012-12-06 (×4): 300 mg via ORAL
  Filled 2012-12-05 (×6): qty 1

## 2012-12-05 MED ORDER — CHLORDIAZEPOXIDE HCL 25 MG PO CAPS
25.0000 mg | ORAL_CAPSULE | Freq: Four times a day (QID) | ORAL | Status: AC | PRN
  Administered 2012-12-05 (×2): 25 mg via ORAL
  Filled 2012-12-05 (×2): qty 1

## 2012-12-05 NOTE — Tx Team (Signed)
Interdisciplinary Treatment Plan Update (Adult)  Date: 12/05/2012   Time Reviewed: 10:09 AM  Progress in Treatment:  Attending groups: Yes  Participating in groups:  Yes  Taking medication as prescribed: Yes  Tolerating medication: Yes  Family/Significant othe contact made: No. SPE not required for this pt.  Patient understands diagnosis: Yes, AEB seeking treatment for ETOH detox, crack cocaine abuse, and mood stabilization. Discussing patient identified problems/goals with staff: Yes  Medical problems stabilized or resolved: Yes  Denies suicidal/homicidal ideation: Yes during admission and group/self report.  Patient has not harmed self or Others: Yes  New problem(s) identified: n/a  Discharge Plan or Barriers:  Additional comments: 47 y.o. male who presents to the Emergency Department requesting detox from cocaine and alcohol. He states that he snorts cocaine and smokes marijuana when he can afford it but drinks 24 beers daily. He denies any IV drug use. Last beer was PTA. He was sober for one year but relasoped in September 2014. He states that since then he has consumed 24 beers daily. He admits that he feels "shaky" if he doesn't get alcohol to drink. He denies SI or HI. He also briefly mentions 3 days of epistaxis episodes but denies any fevers or emesis or abdominal pain. He came into the ED on his own accord. He has been seen at [facility] and would like placement there if possible. He is also borderline diabetic with an ongoing abscess to the bottom of the left foot but denies any symptoms currently.  Reason for Continuation of Hospitalization: Librium taper-withdrawals Mood stabilization Medication management  Estimated length of stay: 2-3 days (likely d/c Wed.)  For review of initial/current patient goals, please see plan of care.  Attendees:  Patient:    Family:    Physician: Geoffery Lyons MD 12/05/2012 10:09 AM   Nursing: Altamease Oiler RN  12/05/2012 10:09 AM   Clinical Social  Worker Julitza Rickles Smart, LCSWA  12/05/2012 10:09 AM   Other: Jan RN  12/05/2012 10:09 AM   Other:    Other: Darden Dates Nurse CM  12/05/2012 10:09 AM   Other:    Scribe for Treatment Team:  [employer] LCSWA 12/05/2012 10:09 AM

## 2012-12-05 NOTE — [facility] Group Notes (Signed)
[facility] LCSW Group Therapy  12/05/2012 2:21 PM  Type of Therapy:  Group Therapy  Participation Level:  Active  Participation Quality:  Attentive  Affect:  Appropriate  Cognitive:  Alert and Oriented  Insight:  Engaged  Engagement in Therapy:  Engaged  Modes of Intervention:  Discussion, Education, Exploration, Socialization and Support  Summary of Progress/Problems: Today's Topic: Overcoming Obstacles. Pt identified obstacles faced currently and processed barriers involved in overcoming these obstacles. Pt identified steps necessary for overcoming these obstacles and explored motivation (internal and external) for facing these difficulties head on. Pt further identified one area of concern in their lives and chose a skill of focus pulled from their "toolbox." [name] was attentive and engaged throughout today's therapy group. [name] stated that his biggest obstacle involves going back home to a "boring/isolative environment." CSW encouraged [name] to identify what worked for him to help him stay sober during his 8 months of sobriety prior to this latest relapse. 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 [name] shows progress in the group setting AEB his ability to actively participate in group discussion, remain on topic, and provide support/encouragement to others as they shared various obstacles. When asked to identify one things he plans to do daily in terms of self care, [name] stated that he plans to buy a schedule book and make lists of activities that he must complete each day. "it helps me feel like I have obligations and have a purpose to getting up and staying sober."     Smart, [place] [employer] LCSWA 12/05/2012, 2:21 PM

## 2012-12-05 NOTE — Progress Notes (Signed)
Pt attended AA group 

## 2012-12-05 NOTE — [facility] Suicide Risk Assessment (Signed)
[facility] INPATIENT: Family/Significant Other Suicide Prevention Education  Suicide Prevention Education:  Education Completed; No one has been identified by the patient as the family member/significant other with whom the patient will be residing, and identified as the person(s) who will aid the patient in the event of a mental health crisis (suicidal ideations/suicide attempt).   Pt did not c/o SI at admission, nor have they endorsed SI during their stay here. SPE not required. SPI pamphlet provided to pt and he was encouraged to share information with his support network, ask questions, and talk about any concerns.   [employer], LCSWA 12/05/2012 12:04 PM

## 2012-12-05 NOTE — [facility] Group Notes (Signed)
[facility] LCSW Aftercare Discharge Planning Group Note   12/05/2012 9:18 AM  Participation Quality:  Appropriate   Mood/Affect:  Appropriate  Depression Rating:  3  Anxiety Rating:  7  Thoughts of Suicide:  No Will you contract for safety?   NA  Current AVH:  No  Plan for Discharge/Comments:  Pt states that he lives by himself currently and would like to have referral for o/p services somewhere other than [facility]. CSW assessing for appropriate referrals-[facility] will likely. Pt reports that his parents are supportive of him. He reports shakiness today and rates withdrawals as 5.   Transportation Means: parents-pt hoping for Wed d/c.   Supports: family supports Neurosurgeon, Research scientist (physical sciences)

## 2012-12-05 NOTE — Progress Notes (Signed)
Adult Psychoeducational Group Note  Date:  12/05/2012 Time:  1:41 PM  Group Topic/Focus:  Dimensions of Wellness:   The focus of this group is to introduce the topic of wellness and discuss the role each dimension of wellness plays in total health.  Participation Level:  Active  Participation Quality:  Appropriate  Affect:  Appropriate  Cognitive:  Appropriate  Insight: Appropriate  Engagement in Group:  Engaged  Modes of Intervention:  Discussion, Education and Support  Additional Comments:  Pt participated in group. He shared he has been drinking since high school and said he will place support and staying busy as two things that will help in his wellness box.  Reynolds Bowl 12/05/2012, 1:41 PM

## 2012-12-05 NOTE — Progress Notes (Signed)
[facility] MD Progress Note  12/05/2012 4:20 PM [name]  MRN:  [id number] Subjective:  [name] endorses that his son got married and moved out of the house. States that being there by himself and his on going struggle with his anxiety triggered the relapse  He also states that his medications were changed when he went for follow up at [facility]. He was placed only on thorazine. States it did not help He has been drinking every day, finds himself unable to stop Diagnosis:   DSM5: Schizophrenia Disorders:  None Obsessive-Compulsive Disorders:  none Trauma-Stressor Disorders:  none Substance/Addictive Disorders:  Alcohol Related Disorder - Severe (303.90), Cocaine related disorder Depressive Disorders:  Major Depressive Disorder - Moderate (296.22)  Axis I: Anxiety Disorder NOS, Generalized Anxiety Disorder and Panic Disorder  ADL's:  Intact  Sleep: Fair  Appetite:  Fair  Suicidal Ideation:  Plan:  denies Intent:  denies Means:  denies Homicidal Ideation:  Plan:  denies Intent:  denies Means:  denies AEB (as evidenced by):  Psychiatric Specialty Exam: Review of Systems  Constitutional: Positive for malaise/fatigue.  HENT: Negative.   Eyes: Negative.   Respiratory: Negative.   Cardiovascular: Negative.   Gastrointestinal: Negative.   Genitourinary: Negative.   Musculoskeletal: Positive for back pain and joint pain.  Skin: Negative.   Neurological: Negative.   Endo/Heme/Allergies: Negative.   Psychiatric/Behavioral: Positive for depression and substance abuse. The patient is nervous/anxious.     Blood pressure 116/79, pulse 106, temperature 99 F (37.2 C), temperature source Oral, resp. rate 16, height 6\' 3"  (1.905 m), weight 87.998 kg (194 lb).Body mass index is 24.25 kg/(m^2).  General Appearance: Fairly Groomed  Patent attorney::  Fair  Speech:  Clear and Coherent and Slow  Volume:  Decreased  Mood:  Anxious and Depressed  Affect:  Restricted  Thought Process:  Coherent and  Goal Directed  Orientation:  Full (Time, Place, and Person)  Thought Content:  symptoms, worries, concerns  Suicidal Thoughts:  No  Homicidal Thoughts:  No  Memory:  Immediate;   Fair Recent;   Fair Remote;   Fair  Judgement:  Fair  Insight:  Present and Shallow  Psychomotor Activity:  Restlessness  Concentration:  Fair  Recall:  Fair  Akathisia:  No  Handed:    AIMS (if indicated):     Assets:  Desire for Improvement  Sleep:  Number of Hours: 5.75   Current Medications: Current Facility-Administered Medications  Medication Dose Route Frequency Provider Last Rate Last Dose  . alum & mag hydroxide-simeth (MAALOX/MYLANTA) 200-200-20 MG/5ML suspension 30 mL  30 mL Oral Q4H PRN Fransisca Kaufmann, NP      . chlordiazePOXIDE (LIBRIUM) capsule 25 mg  25 mg Oral BH-qamhs Fransisca Kaufmann, NP       Followed by  . [START ON 12/07/2012] chlordiazePOXIDE (LIBRIUM) capsule 25 mg  25 mg Oral Daily Fransisca Kaufmann, NP      . chlordiazePOXIDE (LIBRIUM) capsule 25 mg  25 mg Oral Q6H PRN Rachael Fee, MD   25 mg at 12/05/12 1209  . escitalopram (LEXAPRO) tablet 10 mg  10 mg Oral Daily Nanine Means, NP   10 mg at 12/05/12 0102  . gabapentin (NEURONTIN) capsule 300 mg  300 mg Oral TID Rachael Fee, MD   300 mg at 12/05/12 1151  . hydrOXYzine (ATARAX/VISTARIL) tablet 25 mg  25 mg Oral Q6H PRN Fransisca Kaufmann, NP   25 mg at 12/02/12 2238  . ibuprofen (ADVIL,MOTRIN) tablet 800 mg  800 mg Oral  Q6H PRN Kristeen Mans, NP   800 mg at 12/05/12 0559  . loperamide (IMODIUM) capsule 2-4 mg  2-4 mg Oral PRN Fransisca Kaufmann, NP      . magnesium hydroxide (MILK OF MAGNESIA) suspension 30 mL  30 mL Oral Daily PRN Fransisca Kaufmann, NP      . methocarbamol (ROBAXIN) tablet 500 mg  500 mg Oral TID Nanine Means, NP   500 mg at 12/05/12 1151  . multivitamin with minerals tablet 1 tablet  1 tablet Oral Daily Fransisca Kaufmann, NP   1 tablet at 12/05/12 4098  . nabumetone (RELAFEN) tablet 500 mg  500 mg Oral BID Nanine Means, NP   500 mg at 12/05/12 1191   . nicotine (NICODERM CQ - dosed in mg/24 hours) patch 21 mg  21 mg Transdermal Daily Nanine Means, NP   21 mg at 12/05/12 0600  . ondansetron (ZOFRAN-ODT) disintegrating tablet 4 mg  4 mg Oral Q6H PRN Fransisca Kaufmann, NP      . pantoprazole (PROTONIX) EC tablet 40 mg  40 mg Oral BID Nanine Means, NP   40 mg at 12/05/12 4782  . QUEtiapine (SEROQUEL) tablet 100 mg  100 mg Oral QHS Kristeen Mans, NP   100 mg at 12/04/12 2225  . thiamine (VITAMIN B-1) tablet 100 mg  100 mg Oral Daily Fransisca Kaufmann, NP   100 mg at 12/05/12 9562    Lab Results: No results found for this or any previous visit (from the past 48 hour(s)).  Physical Findings: AIMS: Facial and Oral Movements Muscles of Facial Expression: None, normal Lips and Perioral Area: None, normal Jaw: None, normal Tongue: None, normal,Extremity Movements Upper (arms, wrists, hands, fingers): None, normal Lower (legs, knees, ankles, toes): None, normal, Trunk Movements Neck, shoulders, hips: None, normal, Overall Severity Severity of abnormal movements (highest score from questions above): None, normal Incapacitation due to abnormal movements: None, normal Patient's awareness of abnormal movements (rate only patient's report): No Awareness, Dental Status Current problems with teeth and/or dentures?: No Does patient usually wear dentures?: No  CIWA:  CIWA-Ar Total: 3 COWS:     Treatment Plan Summary: Daily contact with patient to assess and evaluate symptoms and progress in treatment Medication management  Plan: Supportive approach/coping skills/relapse prevention           Librium detox/optimize treatment with psychotropics  Medical Decision Making Problem Points:  Review of psycho-social stressors (1) Data Points:  Review of medication regiment & side effects (2) Review of new medications or change in dosage (2)  I certify that inpatient services furnished can reasonably be expected to improve the patient's condition.   [name]  A 12/05/2012, 4:20 PM

## 2012-12-05 NOTE — [facility] Suicide Risk Assessment (Signed)
[facility] INPATIENT:  Family/Significant Other Suicide Prevention Education  Suicide Prevention Education:  Contact Attempts: Valetta Close (pt's sister) [phone] has been identified by the patient as the family member/significant other with whom the patient will be residing, and identified as the person(s) who will aid the patient in the event of a mental health crisis.  With written consent from the patient, two attempts were made to provide suicide prevention education, prior to and/or following the patient's discharge.  We were unsuccessful in providing suicide prevention education.  A suicide education pamphlet was given to the patient to share with family/significant other.  Date and time of first attempt: 11:15AM 12/05/12 Date and time of second attempt: 3:27PM 12/05/12 (voicemail left)   Smart, Shelvia Fojtik LCSWA 12/05/2012, 3:27 PM

## 2012-12-05 NOTE — Progress Notes (Signed)
Pt reports he is doing better this evening.  He says his withdrawal symptoms are decreasing.  He just had medication for his chronic pain shortly before shift change.  He is med compliant on the unit.  He says he just feels hopeless that he can't stay sober.  Pt denies SI/HI/AV.  He makes his needs known to staff.  Pt is attending groups and unit activities.  Support and encouragement offered.  Safety maintained with q15 minute checks.

## 2012-12-05 NOTE — Progress Notes (Signed)
Patient ID: [name], male   DOB: [birth year], 47 y.o.   MRN: [id number] He has been up and to groups interacting with peers and staff. Self inventory: depression 4, hopelessness 7 , Denies SI thoughts  And withdrawal of agitation and anxiety.  Requested and received prn of librium at 12:09 That was effective.  Continue to encourage him to attend groups.

## 2012-12-06 MED ORDER — MIRTAZAPINE 30 MG PO TABS
30.0000 mg | ORAL_TABLET | Freq: Every day | ORAL | Status: DC
  Administered 2012-12-06: 30 mg via ORAL
  Filled 2012-12-06 (×2): qty 1

## 2012-12-06 MED ORDER — GABAPENTIN 400 MG PO CAPS: 400.0000 mg | ORAL_CAPSULE | Freq: Every day | ORAL | Status: DC

## 2012-12-06 MED ORDER — GABAPENTIN 400 MG PO CAPS
400.0000 mg | ORAL_CAPSULE | Freq: Four times a day (QID) | ORAL | Status: DC
  Administered 2012-12-06 – 2012-12-07 (×4): 400 mg via ORAL
  Filled 2012-12-06 (×7): qty 1

## 2012-12-06 MED ORDER — QUETIAPINE FUMARATE 25 MG PO TABS
25.0000 mg | ORAL_TABLET | Freq: Four times a day (QID) | ORAL | Status: DC | PRN
  Administered 2012-12-06 (×2): 25 mg via ORAL
  Filled 2012-12-06 (×3): qty 1

## 2012-12-06 NOTE — Progress Notes (Signed)
Adult Psychoeducational Group Note  Date:  12/06/2012 Time:  1:04 PM  Group Topic/Focus:  Recovery Goals:   The focus of this group is to identify appropriate goals for recovery and establish a plan to achieve them.  Participation Level:  Active  Participation Quality:  Appropriate and Attentive  Affect:  Appropriate  Cognitive:  Alert and Appropriate  Insight: Good  Engagement in Group:  Engaged  Modes of Intervention:  Activity, Discussion, Exploration, Socialization and Support  Additional Comments:  Pt came to group and shared that lack of therapy and fear are standing between him and recovery. Pt plans on attending faith and family services with a new doctor and expanding his social support from therapy and his family to deal with the fear.   Cathlean Cower 12/06/2012, 1:04 PM

## 2012-12-06 NOTE — Progress Notes (Signed)
Recreation Therapy Notes  Date: 11.18.2014 Time: 2:30pm Location: [address] Day[address]   Group Topic: Animal Assisted Activities (AAA)  Behavioral Response: Appropriate, Attentive.   Affect: Euthymic  Clinical Observations/Feedback: Dog Team: Wann & handler. Patient interacted appropriately with peer, dog team, and LRT.   Lejuan Botto L Avynn Klassen, LRT/CTRS  Orlando Devereux L 12/06/2012 5:04 PM 

## 2012-12-06 NOTE — Progress Notes (Signed)
Recreation Therapy Notes   Date: 11.18.2014 Time: 3:00pm Location: [address]   Group Topic: Communication, Team Building, Problem Solving  Goal Area(s) Addresses:  Patient will effectively work with peer towards shared goal.  Patient will identify skill used to make activity successful.  Patient will identify how skills used during activity can be used to reach post d/c goals.   Behavioral Response: Engaged, Attentive, Appropriate  Intervention: Problem Solving Activitiy  Activity: Life Boat. Patients were given a scenario about being on a sinking yacht. Patients were informed the yacht included 15 guest, 8 of which could be placed on the life boat, along with all group members. Individuals on guest list were of varying socioeconomic classes such as a Education officer, museum, Materials engineer, Midwife, Tree surgeon.   Education: Customer service manager, Discharge Planning   Education Outcome: Acknowledges understanding  Clinical Observations/Feedback: Patient actively engaged in group activity, voicing his opinion and debating with peers appropriately. Patient identified experience and skill set as factors that guided his decision making. Patient related identified qualities to his sobriety, stating that experience is what should prevent him from using in the future, as well as it should help him problem solve as he will be in some of the same situations during his recovery as he has previously. Patient additionally identified team work and problem solving as skill needed to make activity successful and stated that these skills work best when used together. Patient related these skills to working with his support system post d/c to maintained his sobriety.  Marykay Lex Ilo Beamon, LRT/CTRS  Madelyne Millikan L 12/06/2012 4:55 PM

## 2012-12-06 NOTE — Progress Notes (Signed)
Attended AA 

## 2012-12-06 NOTE — Progress Notes (Signed)
Patient ID: [name], male   DOB: [birth year], 47 y.o.   MRN: [id number] PER STATE REGULATIONS 482.30  THIS CHART WAS REVIEWED FOR MEDICAL NECESSITY WITH RESPECT TO THE PATIENT'S ADMISSION/ DURATION OF STAY.  NEXT REVIEW DATE: 12/09/2012  Willa Rough, RN, BSN CASE MANAGER

## 2012-12-06 NOTE — Progress Notes (Signed)
Adult Psychoeducational Group Note  Date:  12/06/2012 Time:  10:32 AM  Group Topic/Focus: Group focus on personal strengths  Participation Level:  Active  Participation Quality:  Appropriate and Attentive  Affect:  Appropriate  Cognitive:  Alert and Appropriate  Insight: Appropriate and Good  Engagement in Group:  Engaged  Modes of Intervention:  Activity  Additional Comments:  Pt chose P for persistence. "I am a persistent person and like to get things done".  Reynolds Bowl 12/06/2012, 10:32 AM

## 2012-12-06 NOTE — [facility] Group Notes (Signed)
[facility] LCSW Group Therapy  12/06/2012  1:15 PM   Type of Therapy:  Group Therapy  Participation Level:  Active  Participation Quality:  Appropriate and Attentive  Affect:  Appropriate  Cognitive:  Alert and Appropriate  Insight:  Developing/Improving and Engaged  Engagement in Therapy:  Developing/Improving and Engaged  Modes of Intervention:  Activity, Clarification, Confrontation, Discussion, Education, Exploration, Limit-setting, Orientation, Problem-solving, Rapport Building, Reality Testing, Socialization and Support  Summary of Progress/Problems: Patient was attentive and engaged with speaker from [employer].  Patient was attentive to speaker while they shared their story of dealing with mental health and overcoming it.  Patient expressed interest in their programs and services and received information on their agency.  Patient processed ways they can relate to the speaker.     Gilberta Peeters Horton, LCSW 12/06/2012 1:26 PM   

## 2012-12-06 NOTE — Progress Notes (Signed)
Patient ID: [name], male   DOB: [birth year], 47 y.o.   MRN: [id number] He has been up and to groups interacting approprietly  with peers and staff. Ha requested and received prn medication for anxiety and pain at 12:52 pm. He has been laughing and joking today. Self inventory: depression 4, hopelessness 2, denies SI, withdrawals of agitation and reported physical pain.  Continue to encourage his to attend groups.

## 2012-12-06 NOTE — Progress Notes (Signed)
Pt reports he is doing better and is supposed to discharge on Wednesday to home.  He says he still has high anxiety and was ordered Seroquel 25 mg q6h prn which he has been receiving.  He was given a dose at this time.  Pt denies SI/HI/AV.  He says he has been going to groups.  He still frequently asks for prn medications for his chronic pain and anxiety.  Pt makes his needs known to staff.  Support and encouragement offered.  Safety maintained with q15 minute checks.

## 2012-12-06 NOTE — Progress Notes (Signed)
[facility] MD Progress Note  12/06/2012 6:27 PM [name]  MRN:  [id number] Subjective:  [name] endorses that he is slowly getting there. He is still having Palmer lot of anxiety during the day. He would like more help with it as the anxiety could be Palmer trigger. He is not going back to [facility] as he is afraid that his medications are going to be changed again. He plans once D/C to go to AA, get more involved not isolate Diagnosis:   DSM5: Schizophrenia Disorders:  none Obsessive-Compulsive Disorders:  none Trauma-Stressor Disorders:  none Substance/Addictive Disorders:  Alcohol Related Disorder - Severe (303.90), Cocaine Use Disorder Depressive Disorders:  Major Depressive Disorder - Moderate (296.22)  Axis I: Anxiety Disorder NOS and Panic Disorder  ADL's:  Intact  Sleep: Fair  Appetite:  Fair  Suicidal Ideation:  Plan:  denies Intent:  denies Means:  denies Homicidal Ideation:  Plan:  denies Intent:  denies Means:  denies AEB (as evidenced by):  Psychiatric Specialty Exam: Review of Systems  Constitutional: Negative.   HENT: Negative.   Eyes: Negative.   Respiratory: Negative.   Cardiovascular: Negative.   Gastrointestinal: Negative.   Genitourinary: Negative.   Musculoskeletal: Positive for back pain.  Skin: Negative.   Neurological: Negative.   Endo/Heme/Allergies: Negative.   Psychiatric/Behavioral: Positive for substance abuse. The patient is nervous/anxious.     Blood pressure 116/79, pulse 107, temperature 97.6 F (36.4 C), temperature source Oral, resp. rate 16, height 6\' 3"  (1.905 m), weight 87.998 kg (194 lb).Body mass index is 24.25 kg/(m^2).  General Appearance: Fairly Groomed  Patent attorney::  Fair  Speech:  Clear and Coherent  Volume:  Normal  Mood:  Anxious  Affect:  anxious, worried  Thought Process:  Coherent and Goal Directed  Orientation:  Full (Time, Place, and Person)  Thought Content:  symptoms, worries, concerns  Suicidal Thoughts:  No  Homicidal  Thoughts:  No  Memory:  Immediate;   Fair Recent;   Fair Remote;   Fair  Judgement:  Fair  Insight:  Present  Psychomotor Activity:  Restlessness  Concentration:  Fair  Recall:  Fair  Akathisia:  No  Handed:    AIMS (if indicated):     Assets:  Desire for Improvement Housing  Sleep:  Number of Hours: 6   Current Medications: Current Facility-Administered Medications  Medication Dose Route Frequency Provider Last Rate Last Dose  . alum & mag hydroxide-simeth (MAALOX/MYLANTA) 200-200-20 MG/5ML suspension 30 mL  30 mL Oral Q4H PRN Fransisca Kaufmann, NP      . Melene Muller ON 12/07/2012] chlordiazePOXIDE (LIBRIUM) capsule 25 mg  25 mg Oral Daily Fransisca Kaufmann, NP      . escitalopram (LEXAPRO) tablet 10 mg  10 mg Oral Daily Nanine Means, NP   10 mg at 12/06/12 0757  . gabapentin (NEURONTIN) capsule 400 mg  400 mg Oral QID Rachael Fee, MD   400 mg at 12/06/12 1705  . ibuprofen (ADVIL,MOTRIN) tablet 800 mg  800 mg Oral Q6H PRN Kristeen Mans, NP   800 mg at 12/06/12 1252  . magnesium hydroxide (MILK OF MAGNESIA) suspension 30 mL  30 mL Oral Daily PRN Fransisca Kaufmann, NP      . methocarbamol (ROBAXIN) tablet 500 mg  500 mg Oral TID Nanine Means, NP   500 mg at 12/06/12 1705  . mirtazapine (REMERON) tablet 30 mg  30 mg Oral QHS Rachael Fee, MD      . multivitamin with minerals tablet 1 tablet  1 tablet Oral Daily Fransisca Kaufmann, NP   1 tablet at 12/06/12 0757  . nabumetone (RELAFEN) tablet 500 mg  500 mg Oral BID Nanine Means, NP   500 mg at 12/06/12 1704  . nicotine (NICODERM CQ - dosed in mg/24 hours) patch 21 mg  21 mg Transdermal Daily Nanine Means, NP   21 mg at 12/06/12 0610  . pantoprazole (PROTONIX) EC tablet 40 mg  40 mg Oral BID Nanine Means, NP   40 mg at 12/06/12 1705  . QUEtiapine (SEROQUEL) tablet 100 mg  100 mg Oral QHS Kristeen Mans, NP   100 mg at 12/05/12 2224  . QUEtiapine (SEROQUEL) tablet 25 mg  25 mg Oral Q6H PRN Rachael Fee, MD   25 mg at 12/06/12 1252  . thiamine (VITAMIN B-1) tablet  100 mg  100 mg Oral Daily Fransisca Kaufmann, NP   100 mg at 12/06/12 2841    Lab Results: No results found for this or any previous visit (from the past 48 hour(s)).  Physical Findings: AIMS: Facial and Oral Movements Muscles of Facial Expression: None, normal Lips and Perioral Area: None, normal Jaw: None, normal Tongue: None, normal,Extremity Movements Upper (arms, wrists, hands, fingers): None, normal Lower (legs, knees, ankles, toes): None, normal, Trunk Movements Neck, shoulders, hips: None, normal, Overall Severity Severity of abnormal movements (highest score from questions above): None, normal Incapacitation due to abnormal movements: None, normal Patient's awareness of abnormal movements (rate only patient's report): No Awareness, Dental Status Current problems with teeth and/or dentures?: No Does patient usually wear dentures?: No  CIWA:  CIWA-Ar Total: 1 COWS:     Treatment Plan Summary: Daily contact with patient to assess and evaluate symptoms and progress in treatment Medication management  Plan: Supportive approach/coping skills/relapse prevention           Increase the Neurontin to 400 mg TID and HS           Add Remeron 30 mg HS           Trial with Seroquel 25 mg for anxiety              Medical Decision Making Problem Points:  Review of psycho-social stressors (1) Data Points:  Review of medication regiment & side effects (2) Review of new medications or change in dosage (2)  I certify that inpatient services furnished can reasonably be expected to improve the patient's condition.   [name] 12/06/2012, 6:27 PM

## 2012-12-07 DIAGNOSIS — F332 Major depressive disorder, recurrent severe without psychotic features: Secondary | ICD-10-CM

## 2012-12-07 MED ORDER — NABUMETONE 500 MG PO TABS: 500.0000 mg | ORAL_TABLET | Freq: Two times a day (BID) | ORAL | Status: DC

## 2012-12-07 MED ORDER — ESCITALOPRAM OXALATE 10 MG PO TABS: 10.0000 mg | ORAL_TABLET | Freq: Every day | ORAL | Status: DC

## 2012-12-07 MED ORDER — GABAPENTIN 400 MG PO CAPS: 400.0000 mg | ORAL_CAPSULE | Freq: Four times a day (QID) | ORAL | Status: DC

## 2012-12-07 MED ORDER — PANTOPRAZOLE SODIUM 40 MG PO TBEC: 40.0000 mg | DELAYED_RELEASE_TABLET | Freq: Two times a day (BID) | ORAL | Status: DC

## 2012-12-07 MED ORDER — MIRTAZAPINE 30 MG PO TABS: 30.0000 mg | ORAL_TABLET | Freq: Every day | ORAL | Status: DC

## 2012-12-07 MED ORDER — METHOCARBAMOL 500 MG PO TABS: 500.0000 mg | ORAL_TABLET | Freq: Three times a day (TID) | ORAL | Status: DC

## 2012-12-07 MED ORDER — QUETIAPINE FUMARATE 100 MG PO TABS: 100.0000 mg | ORAL_TABLET | Freq: Every day | ORAL | Status: DC

## 2012-12-07 NOTE — Progress Notes (Signed)
Pt was discharged home today.  He denied any S/I H/I or A/V hallucinations.  He was given f/u appointment, rx, sample medications, and hotline info booklet.  He voiced understanding to all instructions provided.  He declined the need for smoking cessation materials.  He removed his nicotine patch before he left. 

## 2012-12-07 NOTE — [facility] Group Notes (Signed)
[facility] LCSW Aftercare Discharge Planning Group Note   12/07/2012 9:10 AM  Participation Quality:  Appropriate   Mood/Affect:  Appropriate  Depression Rating:  1  Anxiety Rating:  5  Thoughts of Suicide:  No Will you contract for safety?   NA  Current AVH:  No  Plan for Discharge/Comments:  Pt reports that he is ready for d/c today. He reports "very mild" withdrawal symptoms and reports that Dr. Dub Mikes agreed to write him prescription with refills being that pt's first med management appt is in January and he is refusing to return to [facility].   Transportation Means: parents coming at 1:30PM   Supports: parents/family supports identified by pt.   Smart, [employer]

## 2012-12-07 NOTE — Progress Notes (Signed)
Adult Psychoeducational Group Note  Date:  12/07/2012 Time:  1:11 PM  Group Topic/Focus:  Personal Choices and Values:   The focus of this group is to help patients assess and explore the importance of values in their lives, how their values affect their decisions, how they express their values and what opposes their expression.  Participation Level:  Did Not Attend  Additional Comments:  Pt was encouraged to come to group but pt stayed in bed.  Cathlean Cower 12/07/2012, 1:11 PM

## 2012-12-07 NOTE — Progress Notes (Signed)
Patient ID: [name], male   DOB: 1965/10/13, 47 y.o.   MRN: [id number] Has  Been up and to groups interacting with peers and staff. Requested and received prn for rt. Knee pain this AM that decreased the pain.  Self inventory : depression 2, hopelessness 1, denies SI thoughts.

## 2012-12-07 NOTE — Progress Notes (Signed)
[facility] Adult Case Management Discharge Plan :  Will you be returning to the same living situation after discharge: Yes,  home  At discharge, do you have transportation home?:Yes,  parents Do you have the ability to pay for your medications:Yes,  Medicare  Release forms have been signed and turned in to medical records. Pt refused earlier appt (at [facility] for med management - Dr. Dub Mikes is writing prescriptions with refills to hold pt over until his med management appt at [facility] in [facility] on 01/26/13.)   Patient to Follow up at: Follow-up Information   [facility] with [facility] [place] Today. (Appt with Godfrey Pick at 12:45PM for therapy. Please cancel/reschedule within 48 hours if necessary. If you miss this appt, you will not be able to go to this facility for medication management. )    Contact information:   [address] [place], [place] [address] Phone: [phone] Fax: [phone]      [facility] up with [facility]  On 01/26/2013. (Appt. at [facility] for medication managment with Dr. Augustine Radar. )    Contact information:   [address] [place], [place] [address] Phone: [phone] Fax: [phone]      Patient denies SI/HI:   Yes,  during group; self report.    Safety Planning and Suicide Prevention discussed:  Yes,  contact attempts made with pt's sister. SPE completed with [facility], He was given SPI pamphlet and encouraged to share this information with his support network.   Smart, Sidney Silberman LCSWA  12/07/2012, 10:30 AM

## 2012-12-07 NOTE — [facility] Suicide Risk Assessment (Signed)
Suicide Risk Assessment  Discharge Assessment     Demographic Factors:  Male and Caucasian  Mental Status Per Nursing Assessment::   On Admission:     Current Mental Status by Physician: In full contact with reality. There are no suicidal ideas, plans or intent. His mood is euthymic, his affect is appropriate. There are no active S/S of withdrawal. He is committed to abstinence. He is planning to get a new sponsor and continue to work the program. He is being referred to another agency hoping that his medications would not be overhauled.    Loss Factors: Decline in physical health  Historical Factors: NA  Risk Reduction Factors:   Positive social support  Continued Clinical Symptoms:  Alcohol/Substance Abuse/Dependencies  Cognitive Features That Contribute To Risk:  Closed-mindedness Polarized thinking Thought constriction (tunnel vision)    Suicide Risk:  Minimal: No identifiable suicidal ideation.  Patients presenting with no risk factors but with morbid ruminations; may be classified as minimal risk based on the severity of the depressive symptoms  Discharge Diagnoses:   AXIS I:  Alcohol Dependence, Cocaine Abuse, GAD,PanicAttacks AXIS II:  Deferred AXIS III:   Past Medical History  Diagnosis Date  . Diabetic foot ulcer   . ETOH abuse   . Anxiety   . Open wound     bottom of foot  . Diabetes mellitus without complication     borderline  . Neuromuscular disorder     neuropathy  . GERD (gastroesophageal reflux disease)     tums  . Pneumonia ~ 2012  . History of blood transfusion     "related to left knee OR; probably right hip too" (04/21/2012)  . Stroke 2008    "they said I might have had one during right hip replacement" (04/21/2012)  . Arthritis     "everywhere" (04/21/2012)  . Mental disorder   . Depression   . DDD (degenerative disc disease)    AXIS IV:  other psychosocial or environmental problems AXIS V:  61-70 mild symptoms  Plan Of Care/Follow-up  recommendations:  Activity:  as tolerated Diet:  regular Follow up outpatient basis/AA Is patient on multiple antipsychotic therapies at discharge:  No   Has Patient had three or more failed trials of antipsychotic monotherapy by history:  No  Recommended Plan for Multiple Antipsychotic Therapies: NA  Mosetta Ferdinand A 12/07/2012, 1:37 PM

## 2012-12-08 NOTE — Discharge Summary (Signed)
Physician Discharge Summary Note  Patient:  [name] is an 46 y.o., male MRN:  [id number] DOB:  11/27/1965 Patient phone:  [phone] (home)  Patient address:   [address] [place] [address],   Date of Admission:  12/02/2012 Date of Discharge: 12/07/2012  Reason for Admission:  Alcohol detox/dependency  Discharge Diagnoses: Principal Problem:   Alcohol dependence Active Problems:   Substance abuse   Cocaine abuse   Generalized anxiety disorder   Panic attacks  Review of Systems  Constitutional: Negative.   HENT: Negative.   Eyes: Negative.   Respiratory: Negative.   Cardiovascular: Negative.   Gastrointestinal: Negative.   Genitourinary: Negative.   Musculoskeletal: Negative.   Skin: Negative.   Neurological: Negative.   Endo/Heme/Allergies: Negative.   Psychiatric/Behavioral: Positive for depression and substance abuse.    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 Diabetic foot ulcer   . ETOH abuse   . Anxiety   . Open wound     bottom of foot  . Diabetes mellitus without complication     borderline  . Neuromuscular disorder     neuropathy  . GERD (gastroesophageal reflux disease)     tums  . Pneumonia ~ 2012  . History of blood transfusion     "related to left knee OR; probably right hip too" (04/21/2012)  . Stroke 2008    "they said I might have had one during right hip replacement" (04/21/2012)  . Arthritis     "everywhere" (04/21/2012)  . Mental disorder   . Depression   . DDD (degenerative disc disease)    AXIS IV:  other psychosocial or environmental  problems, problems related to social environment and problems with primary support group AXIS V:  61-70 mild symptoms  Level of Care:  OP  Hospital Course:  On admission:  47 y.o. male who presents to the Emergency Department requesting detox from cocaine and alcohol. He states that he snorts cocaine and smokes marijuana when he can afford it but drinks 24 beers daily. He denies any IV drug use. Last beer was PTA. He was sober for one year but relasoped in September 2014. He states that since then he has consumed 24 beers daily. He admits that he feels "shaky" if he doesn't get alcohol to drink. He denies SI or HI. He also briefly mentions 3 days of epistaxis episodes but denies any fevers or emesis or abdominal pain. He came into the ED on his own accord. He has been seen at [facility] and would like placement there if possible. He is also borderline diabetic with an ongoing abscess to the bottom of the left foot but denies any symptoms currently.   During hospitalization:  [facility] alcohol protocol implemented successfully.  Medications managed--Gabapentin 400 mg QID for neuropathic pain, Robaxin 500 mg TID for muscled spasms, Relafen 500 mg BID for pain, Protonix 40 mg daily for GERD, Lexapro 10 mg daily for depression, and Seroquel 100 mg at bedtime for sleep and mood stability with Remeron 30 mg at bedtime for sleep issues.  [name] attended and participated in therapy.  He denied suicidal/homicidal ideations and auditory/visual  hallucinations, follow-up appointments encouraged to attend, outside support groups encouraged and information given, Rx given.  [name] is mentally and physically stable for discharge.  Consults:  None  Significant Diagnostic Studies:  labs: completed, reviewed,stable  Discharge Vitals:   Blood pressure 111/79, pulse 105, temperature 97.9 F (36.6 C), temperature source Oral, resp. rate 20, height 6\' 3"  (1.905 m), weight 87.998 kg (194 lb). Body mass index is 24.25  kg/(m^2). Lab Results:   No results found for this or any previous visit (from the past 72 hour(s)).  Physical Findings: AIMS: Facial and Oral Movements Muscles of Facial Expression: None, normal Lips and Perioral Area: None, normal Jaw: None, normal Tongue: None, normal,Extremity Movements Upper (arms, wrists, hands, fingers): None, normal Lower (legs, knees, ankles, toes): None, normal, Trunk Movements Neck, shoulders, hips: None, normal, Overall Severity Severity of abnormal movements (highest score from questions above): None, normal Incapacitation due to abnormal movements: None, normal Patient's awareness of abnormal movements (rate only patient's report): No Awareness, Dental Status Current problems with teeth and/or dentures?: No Does patient usually wear dentures?: No  CIWA:  CIWA-Ar Total: 0 COWS:     Psychiatric Specialty Exam: See Psychiatric Specialty Exam and Suicide Risk Assessment completed by Attending Physician prior to discharge.  Discharge destination:  Home  Is patient on multiple antipsychotic therapies at discharge:  No   Has Patient had three or more failed trials of antipsychotic monotherapy by history:  No  Recommended Plan for Multiple Antipsychotic Therapies: NA  Discharge Orders   Future Orders Complete By Expires   Diet - low sodium heart healthy  As directed    Discharge instructions  As directed    Comments:     Keep your appointments, continue to take your medications, continue to work your relapse prevention plan, go back to AA   Increase activity slowly  As directed        Medication List    STOP taking these medications       ibuprofen 200 MG tablet  Commonly known as:  ADVIL,MOTRIN      TAKE these medications     Indication   escitalopram 10 MG tablet  Commonly known as:  LEXAPRO  Take 1 tablet (10 mg total) by mouth daily.   Indication:  Depression     gabapentin 400 MG capsule  Commonly known as:  NEURONTIN  Take 1  capsule (400 mg total) by mouth 4 (four) times daily. For anxiety/pain   Indication:  Alcohol Withdrawal Syndrome, neuropathic pain/anxiety     methocarbamol 500 MG tablet  Commonly known as:  ROBAXIN  Take 1 tablet (500 mg total) by mouth 3 (three) times daily. For spasm   Indication:  Musculoskeletal Pain     mirtazapine 30 MG tablet  Commonly known as:  REMERON  Take 1 tablet (30 mg total) by mouth at bedtime. Depression/sleep/anxiety   Indication:  Trouble Sleeping, depression/anxiety     nabumetone 500 MG tablet  Commonly known as:  RELAFEN  Take 1 tablet (500 mg total) by mouth 2 (two) times daily. pain   Indication:  pain/discomfort     pantoprazole 40 MG tablet  Commonly known as:  PROTONIX  Take 1 tablet (40 mg total) by mouth 2 (two) times daily. For stomach   Indication:  stomach     QUEtiapine 100 MG tablet  Commonly known as:  SEROQUEL  Take 1 tablet (100 mg total) by mouth at bedtime. For thoughts/mood   Indication:  thoughts/mood  Follow-up Information   Follow up with [facility] [place] Today. (Appt with Godfrey Pick at 12:45PM for therapy. Please cancel/reschedule within 48 hours if necessary. If you miss this appt, you will not be able to go to this facility for medication management. )    Contact information:   [address] [place], [place] [address] Phone: [phone] Fax: [phone]      Follow up with [facility]  On 01/26/2013. (Appt. at [facility] for medication managment with Dr. Augustine Radar. )    Contact information:   [address] [place], [place] [address] Phone: [phone] Fax: [phone]      Follow-up recommendations:  Activity:  as tolerated Diet:  low-sodium heart healthy diet Continue to work your relapse prevention plan Comments:  Patient will continue his care at [facility].  Total Discharge Time:  Greater than 30 minutes.  SignedNanine Means, PMH-NP 12/08/2012, 1:59 PM Agree  with assessment and plan Madie Reno A. Dub Mikes, M.D.

## 2012-12-12 NOTE — Progress Notes (Signed)
Patient Discharge Instructions:  After Visit Summary (AVS):   Faxed to:  12/12/12 Discharge Summary Note:   Faxed to:  12/12/12 Psychiatric Admission Assessment Note:   Faxed to:  12/12/12 Suicide Risk Assessment - Discharge Assessment:   Faxed to:  12/12/12 Faxed/Sent to the Next Level Care provider:  12/12/12 Faxed to [facility] @ [phone]  Jerelene Redden, 12/12/2012, 2:28 PM

## 2013-01-17 ENCOUNTER — Other Ambulatory Visit: Payer: Self-pay | Admitting: Orthopedic Surgery

## 2013-01-17 DIAGNOSIS — M545 Low back pain: Secondary | ICD-10-CM

## 2013-01-22 ENCOUNTER — Other Ambulatory Visit: Payer: Self-pay

## 2013-01-27 ENCOUNTER — Other Ambulatory Visit: Payer: Self-pay

## 2013-02-01 ENCOUNTER — Ambulatory Visit
Admission: RE | Admit: 2013-02-01 | Discharge: 2013-02-01 | Disposition: A | Discharge status: Home / Self Care | Payer: Medicare Other | Source: Ambulatory Visit | Attending: Orthopedic Surgery | Admitting: Orthopedic Surgery

## 2013-02-01 DIAGNOSIS — M545 Low back pain, unspecified: Secondary | ICD-10-CM

## 2013-03-23 IMAGING — CR DG KNEE COMPLETE 4+V*R*
4 series · 4 of 4 positions shown · non-contrast
Comparison: 01/02/2005

CLINICAL DATA: Status post fall.

RIGHT KNEE - COMPLETE 4+ VIEW

[view not recorded (1 of 4)]
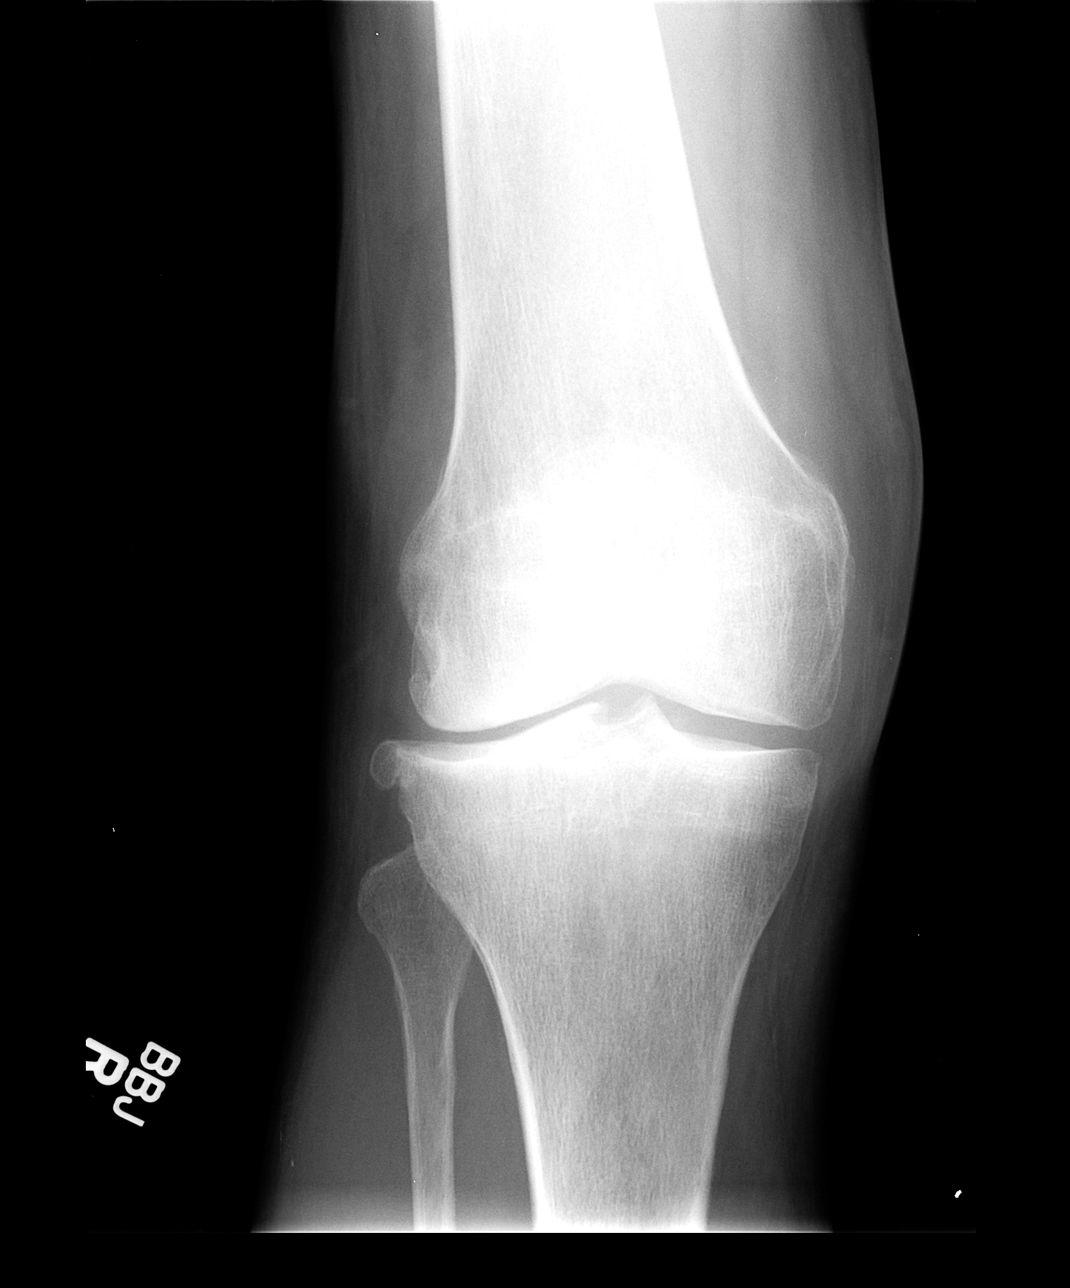

[view not recorded (2 of 4)]
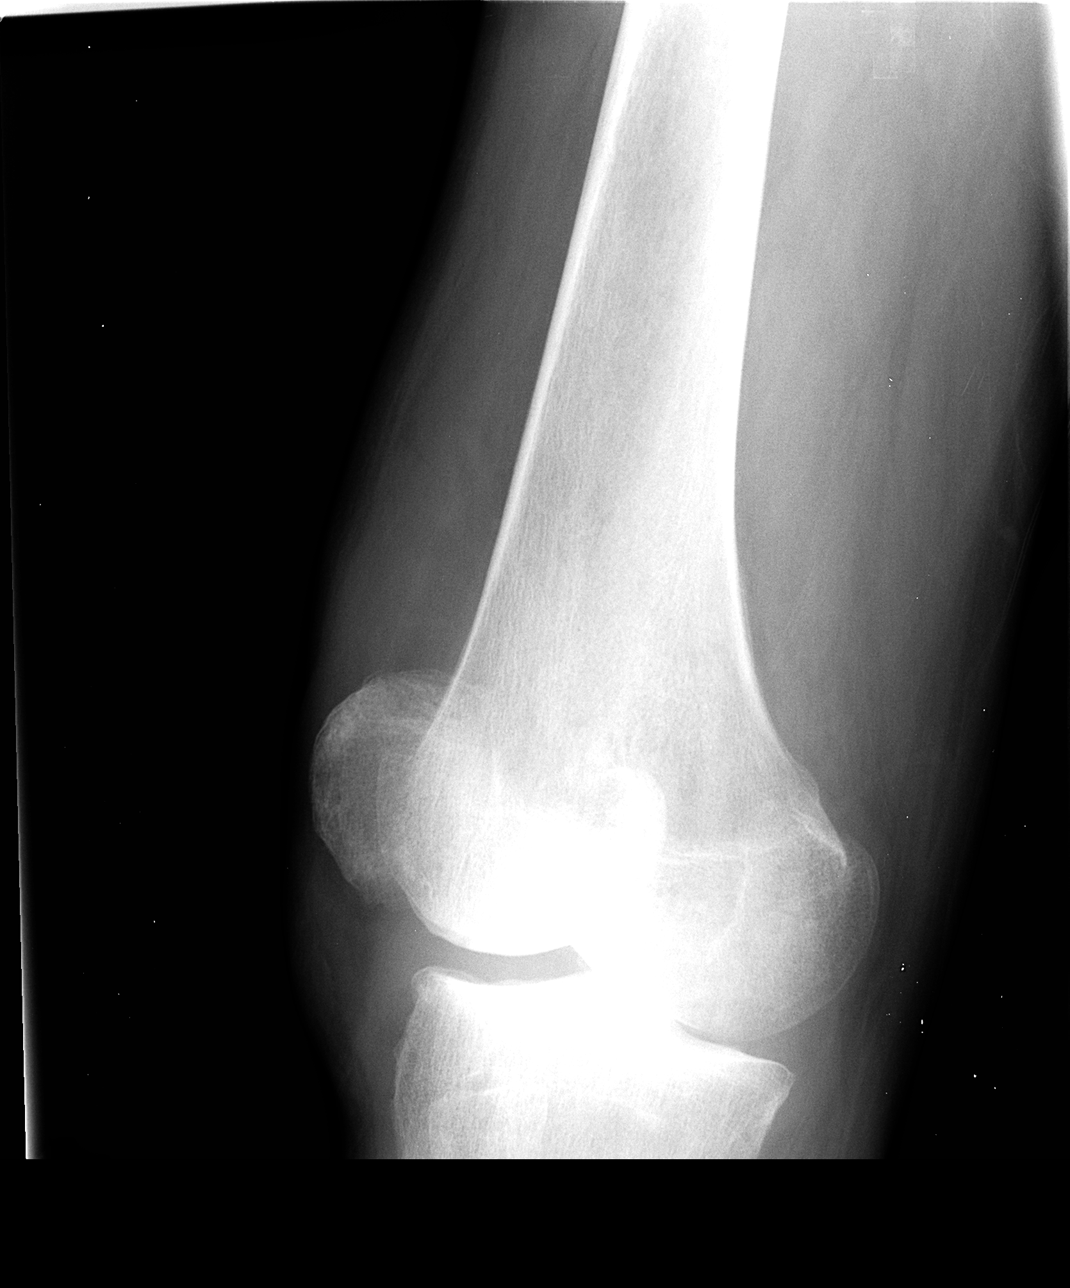

[view not recorded (3 of 4)]
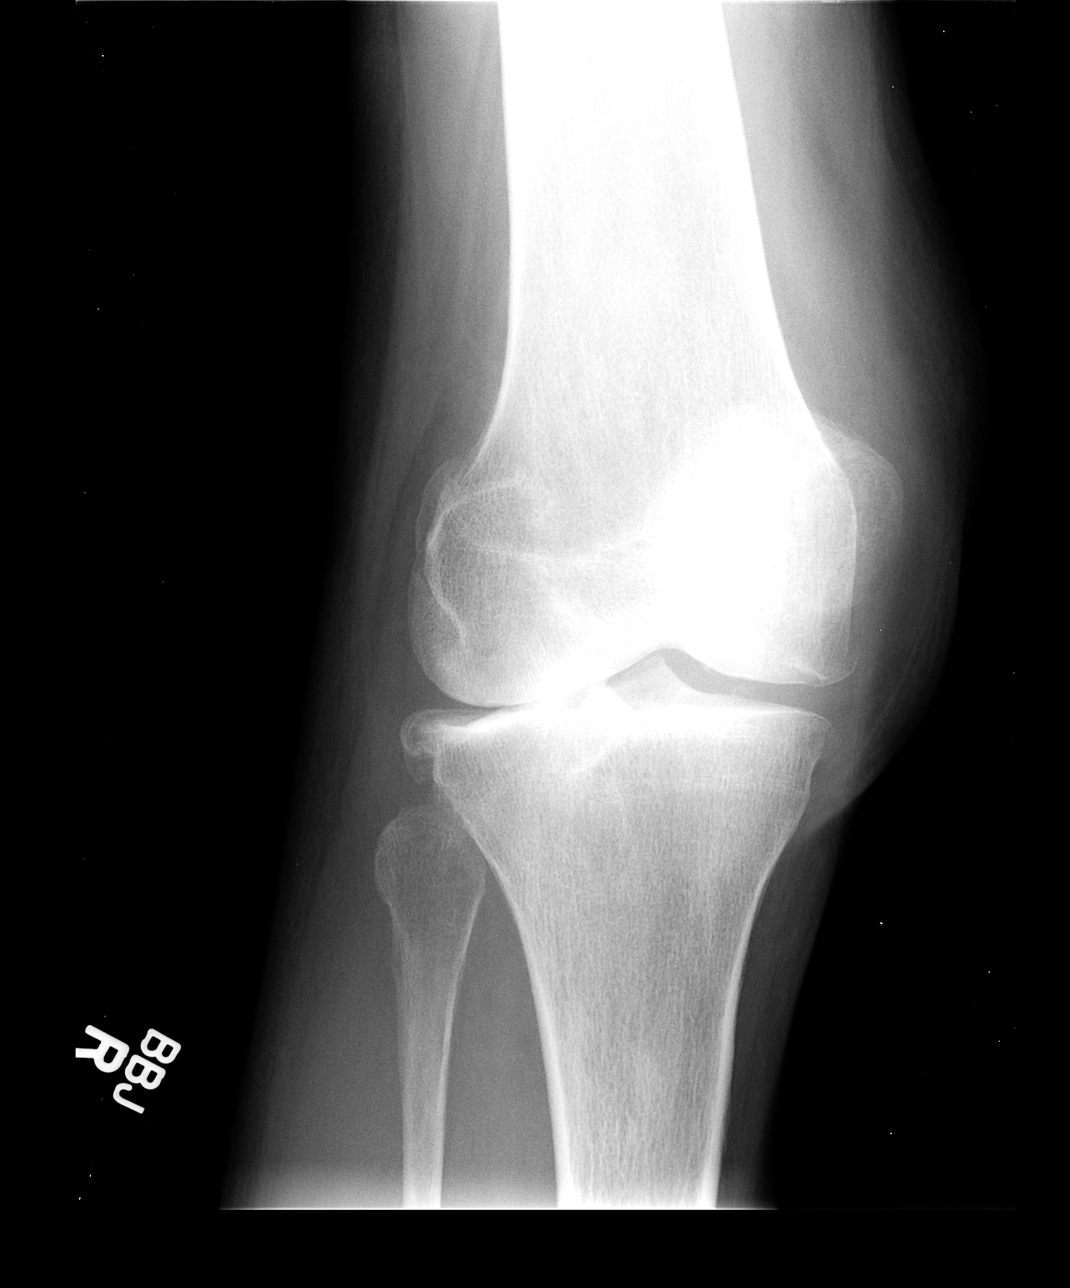

[view not recorded (4 of 4)]
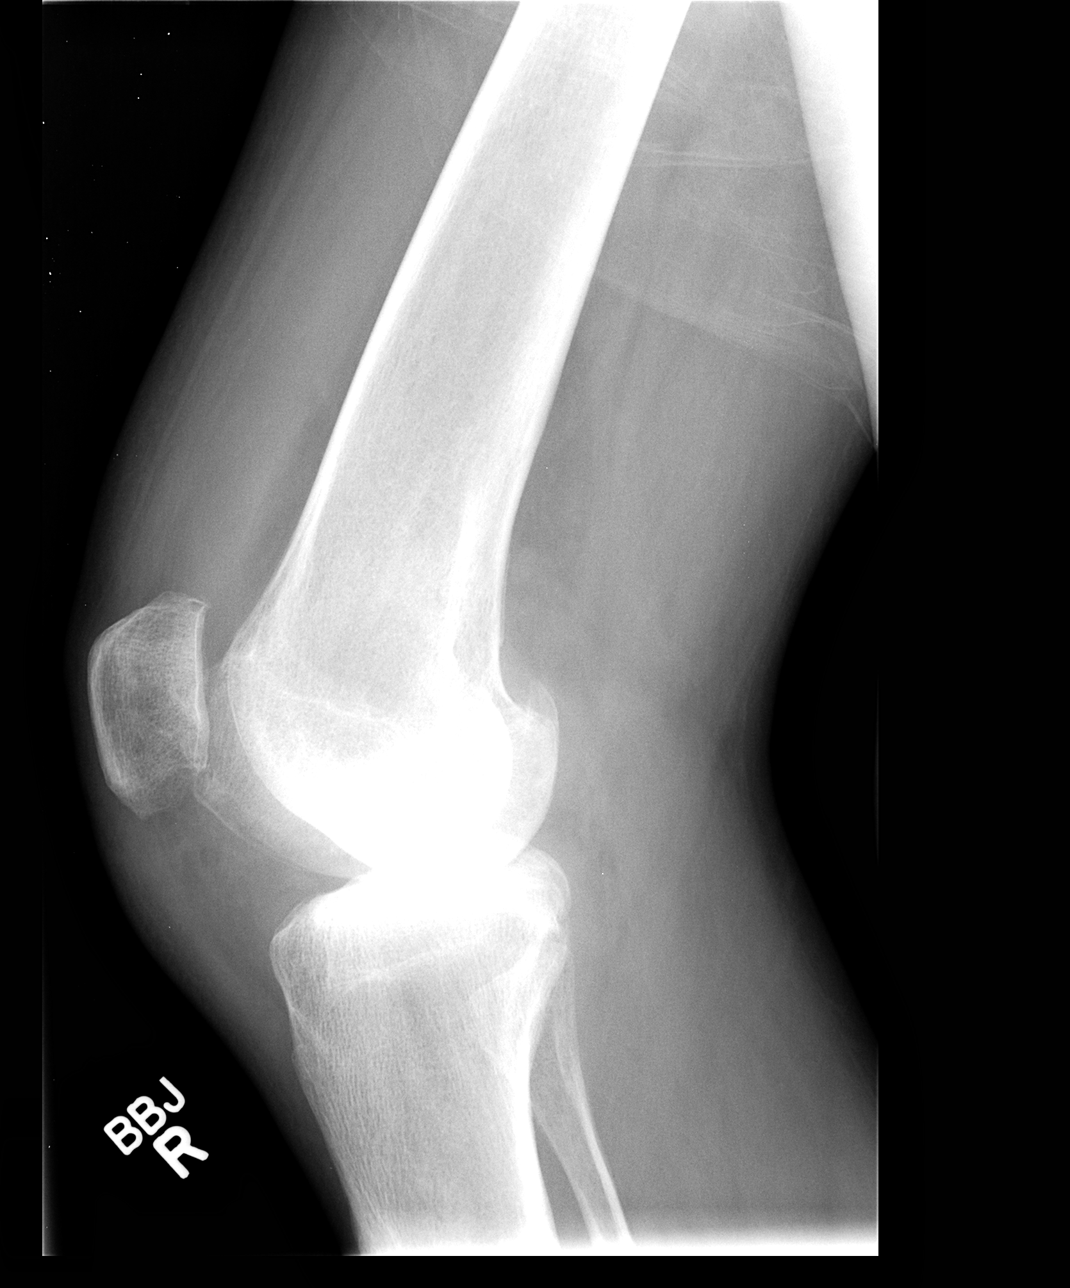

[4 of 4 positions shown; findings below may reference images not displayed]

FINDINGS: There is severe tricompartment osteoarthritis.

Small joint effusion.

No fracture or dislocation identified.
IMPRESSION: 1.  Severe tricompartment osteoarthritis.
2.  Small joint effusion.

## 2013-03-23 IMAGING — CR DG ANKLE COMPLETE 3+V*L*
3 series · 3 of 3 positions shown · non-contrast
Comparison: None

CLINICAL DATA: Trauma.  Fall

LEFT ANKLE COMPLETE - 3+ VIEW

[view not recorded (1 of 3)]
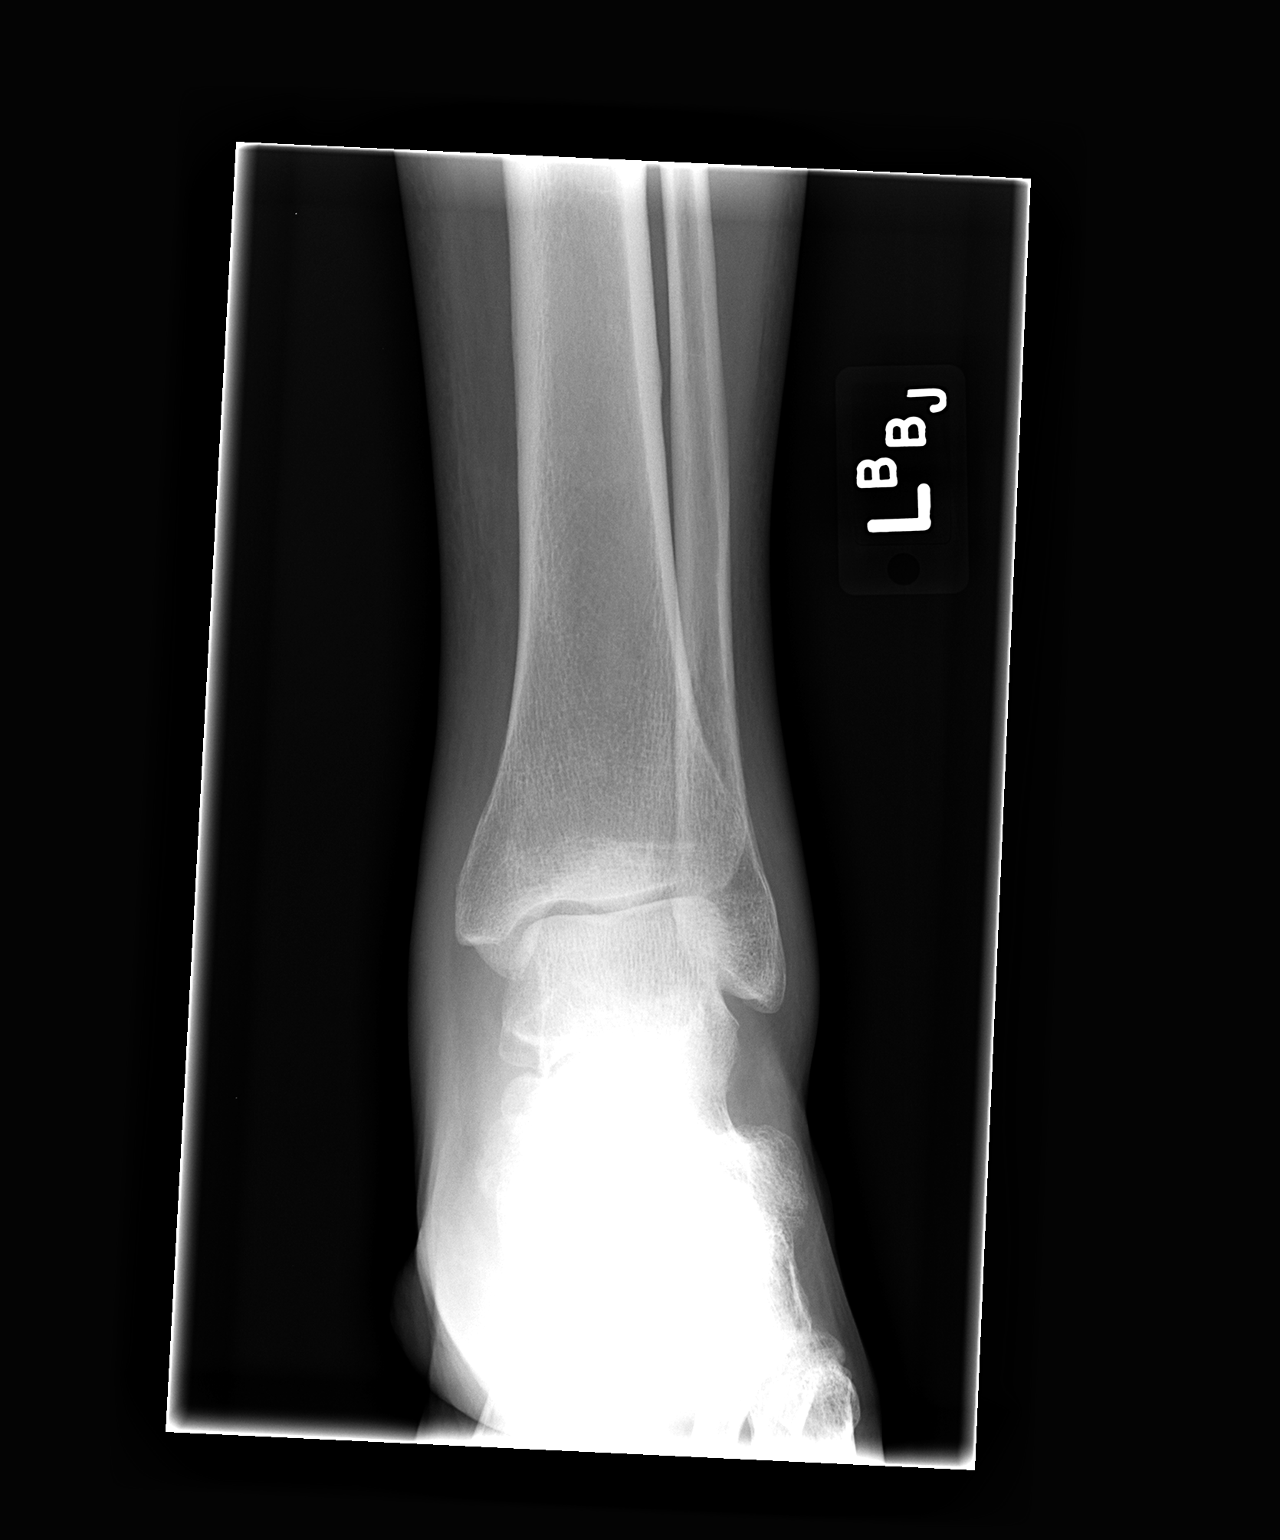

[view not recorded (2 of 3)]
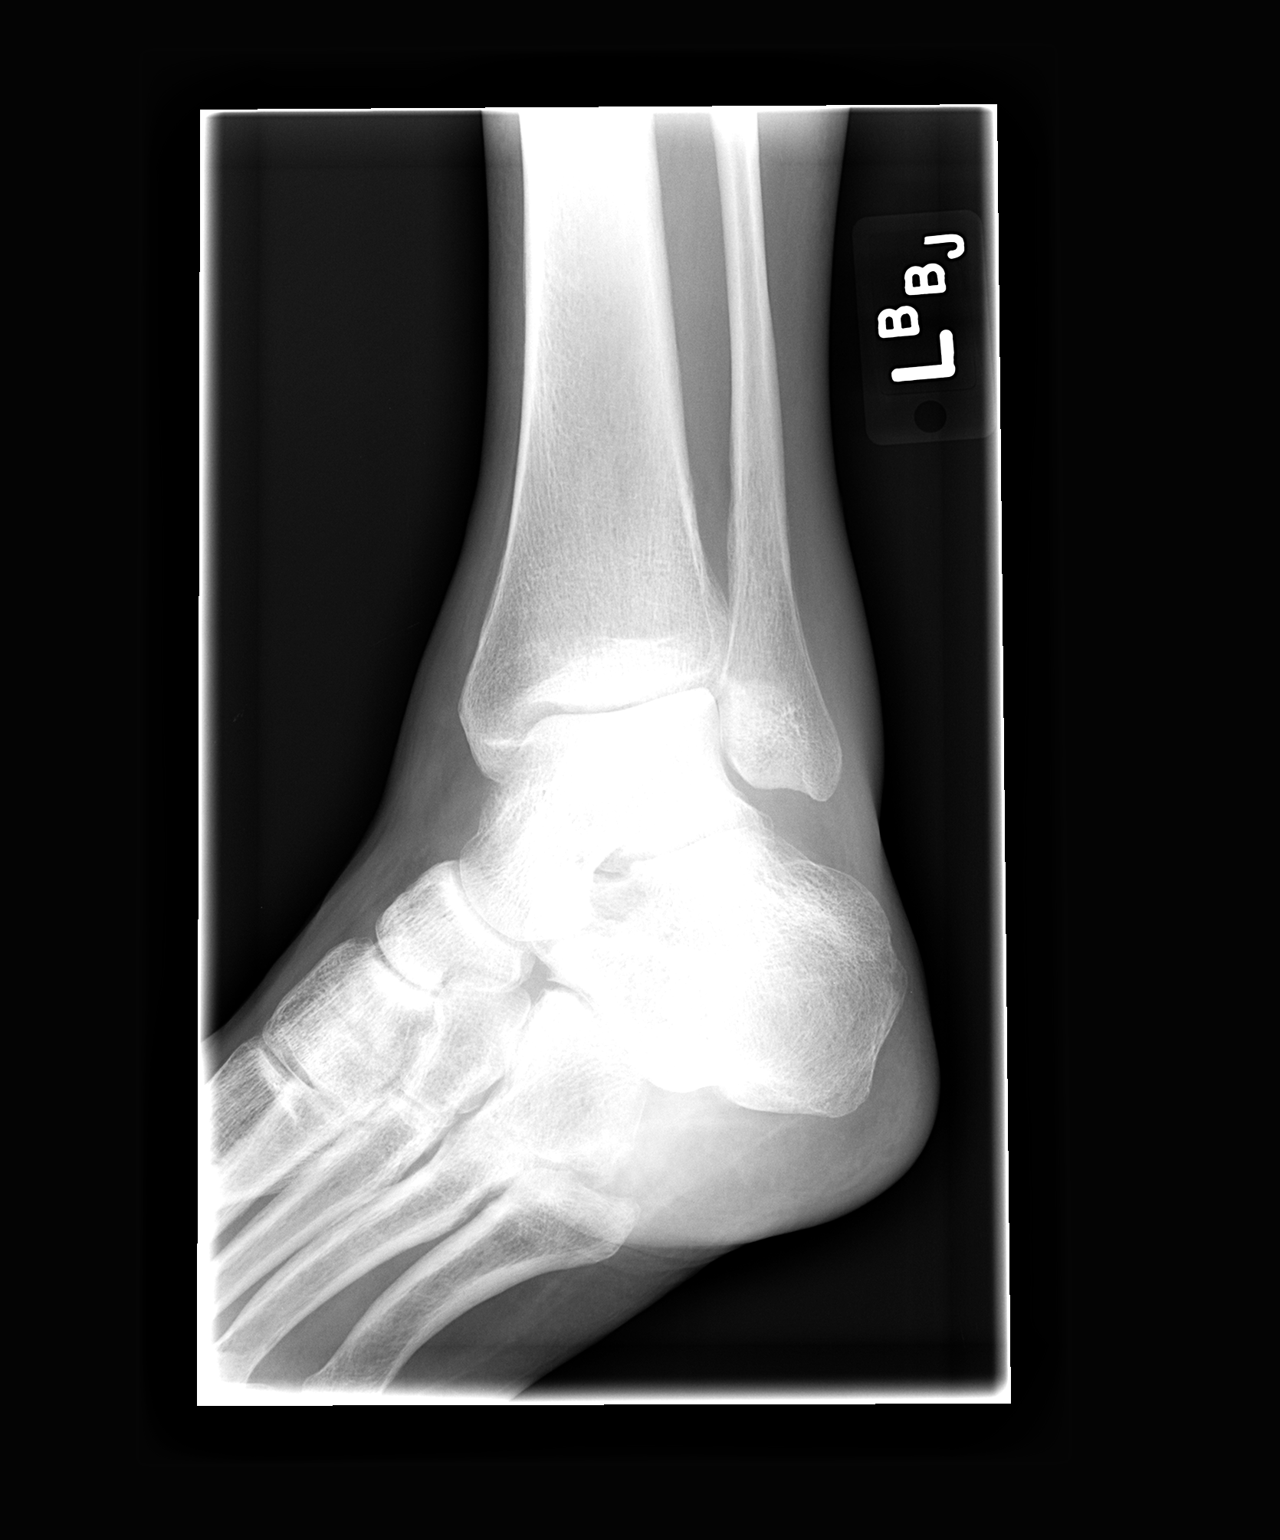

[view not recorded (3 of 3)]
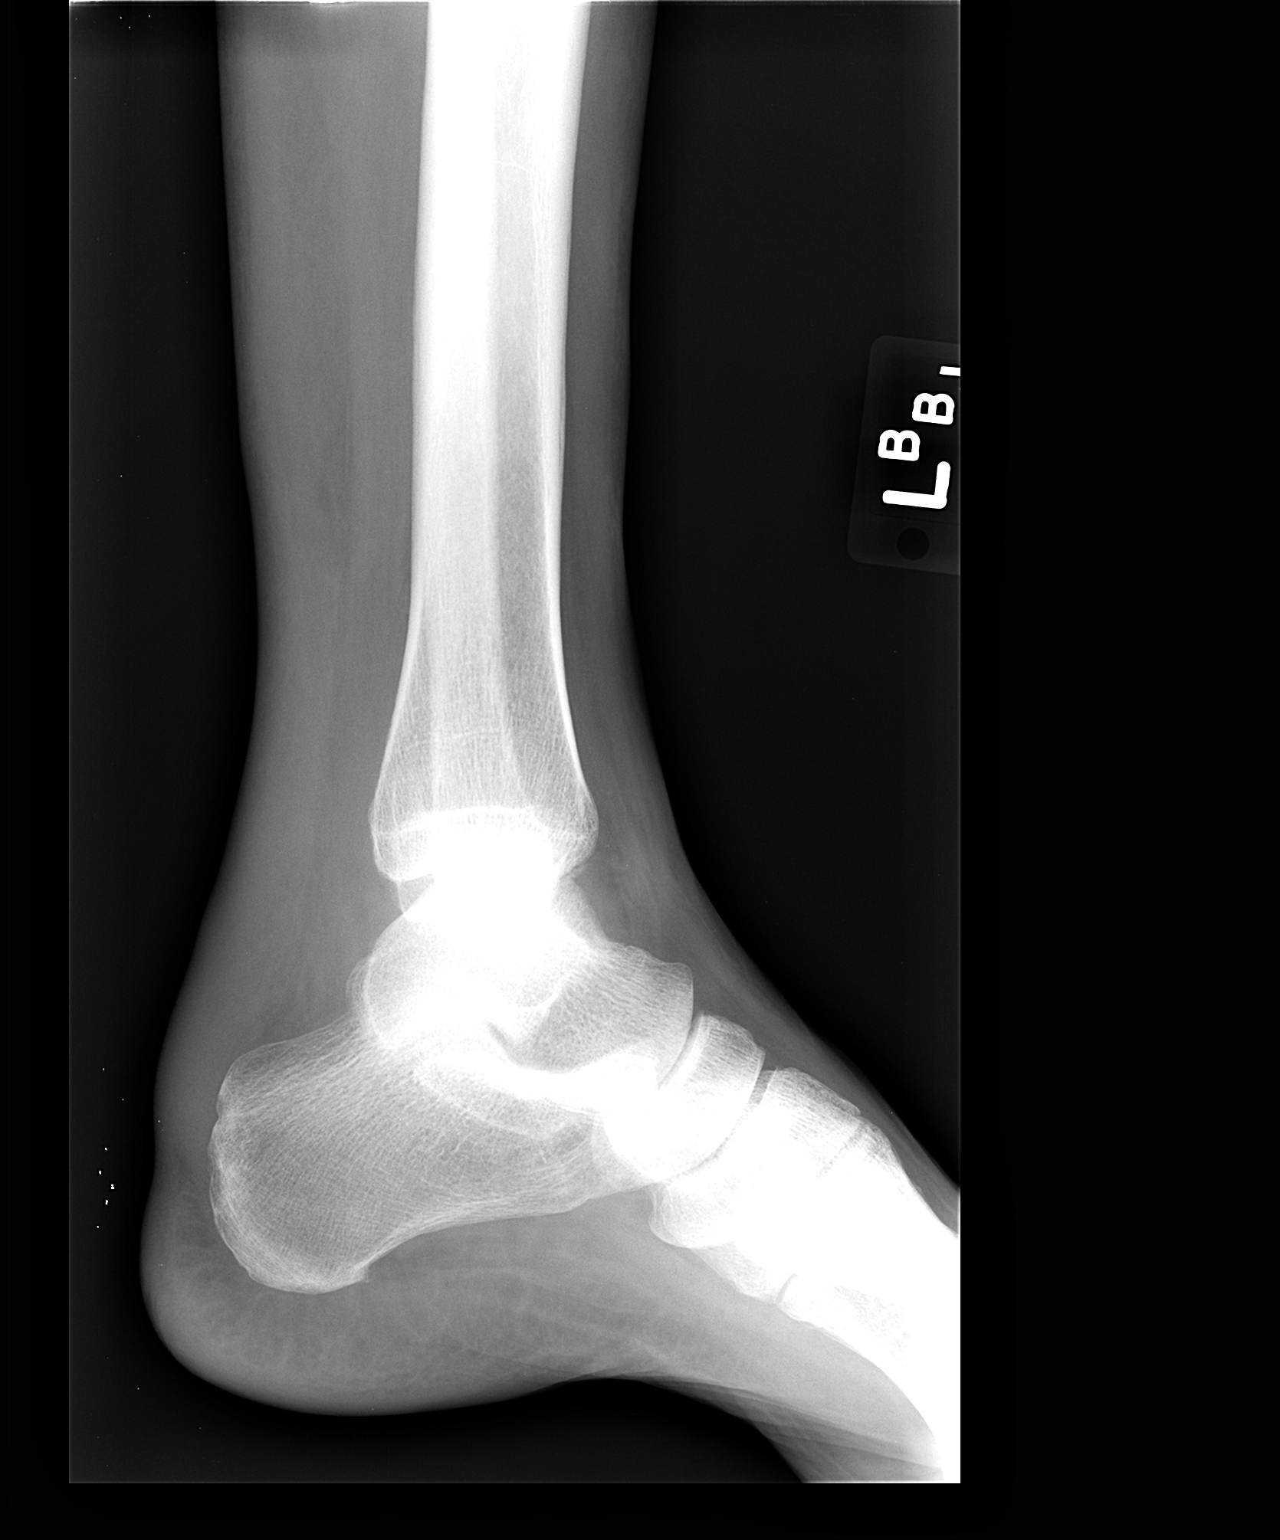

[3 of 3 positions shown; findings below may reference images not displayed]

FINDINGS: There is no evidence of fracture or dislocation.  There
is no evidence of arthropathy or other focal bone abnormality.
Soft tissues are unremarkable.
IMPRESSION: Negative exam.

## 2013-03-24 ENCOUNTER — Inpatient Hospital Stay (HOSPITAL_COMMUNITY)
Admission: AD | Admit: 2013-03-24 | Discharge: 2013-03-29 | DRG: 897 | Disposition: A | Discharge status: Home / Self Care | Payer: Medicare Other | Source: Intra-hospital | Attending: Psychiatry | Admitting: Psychiatry

## 2013-03-24 ENCOUNTER — Emergency Department (HOSPITAL_COMMUNITY)
Admission: EM | Admit: 2013-03-24 | Discharge: 2013-03-24 | Disposition: A | Discharge status: Home / Self Care | Payer: Medicare Other | Source: Home / Self Care | Attending: Emergency Medicine | Admitting: Emergency Medicine

## 2013-03-24 ENCOUNTER — Encounter (HOSPITAL_COMMUNITY): Payer: Self-pay

## 2013-03-24 ENCOUNTER — Encounter (HOSPITAL_COMMUNITY): Payer: Self-pay | Admitting: Emergency Medicine

## 2013-03-24 DIAGNOSIS — F1410 Cocaine abuse, uncomplicated: Secondary | ICD-10-CM

## 2013-03-24 DIAGNOSIS — G4700 Insomnia, unspecified: Secondary | ICD-10-CM | POA: Diagnosis present

## 2013-03-24 DIAGNOSIS — G589 Mononeuropathy, unspecified: Secondary | ICD-10-CM | POA: Diagnosis present

## 2013-03-24 DIAGNOSIS — Z87828 Personal history of other (healed) physical injury and trauma: Secondary | ICD-10-CM | POA: Insufficient documentation

## 2013-03-24 DIAGNOSIS — F1420 Cocaine dependence, uncomplicated: Secondary | ICD-10-CM | POA: Diagnosis present

## 2013-03-24 DIAGNOSIS — F329 Major depressive disorder, single episode, unspecified: Secondary | ICD-10-CM | POA: Insufficient documentation

## 2013-03-24 DIAGNOSIS — F332 Major depressive disorder, recurrent severe without psychotic features: Secondary | ICD-10-CM | POA: Diagnosis present

## 2013-03-24 DIAGNOSIS — L97509 Non-pressure chronic ulcer of other part of unspecified foot with unspecified severity: Secondary | ICD-10-CM

## 2013-03-24 DIAGNOSIS — F1020 Alcohol dependence, uncomplicated: Principal | ICD-10-CM | POA: Diagnosis present

## 2013-03-24 DIAGNOSIS — K219 Gastro-esophageal reflux disease without esophagitis: Secondary | ICD-10-CM | POA: Diagnosis present

## 2013-03-24 DIAGNOSIS — IMO0002 Reserved for concepts with insufficient information to code with codable children: Secondary | ICD-10-CM | POA: Diagnosis present

## 2013-03-24 DIAGNOSIS — E1169 Type 2 diabetes mellitus with other specified complication: Secondary | ICD-10-CM | POA: Insufficient documentation

## 2013-03-24 DIAGNOSIS — Z8673 Personal history of transient ischemic attack (TIA), and cerebral infarction without residual deficits: Secondary | ICD-10-CM | POA: Insufficient documentation

## 2013-03-24 DIAGNOSIS — E119 Type 2 diabetes mellitus without complications: Secondary | ICD-10-CM | POA: Diagnosis present

## 2013-03-24 DIAGNOSIS — F410 Panic disorder [episodic paroxysmal anxiety] without agoraphobia: Secondary | ICD-10-CM | POA: Diagnosis present

## 2013-03-24 DIAGNOSIS — Z8739 Personal history of other diseases of the musculoskeletal system and connective tissue: Secondary | ICD-10-CM | POA: Insufficient documentation

## 2013-03-24 DIAGNOSIS — F17200 Nicotine dependence, unspecified, uncomplicated: Secondary | ICD-10-CM | POA: Diagnosis present

## 2013-03-24 DIAGNOSIS — F1010 Alcohol abuse, uncomplicated: Secondary | ICD-10-CM

## 2013-03-24 DIAGNOSIS — F411 Generalized anxiety disorder: Secondary | ICD-10-CM | POA: Insufficient documentation

## 2013-03-24 DIAGNOSIS — F1910 Other psychoactive substance abuse, uncomplicated: Secondary | ICD-10-CM | POA: Diagnosis present

## 2013-03-24 DIAGNOSIS — Z8701 Personal history of pneumonia (recurrent): Secondary | ICD-10-CM | POA: Insufficient documentation

## 2013-03-24 DIAGNOSIS — Z8719 Personal history of other diseases of the digestive system: Secondary | ICD-10-CM | POA: Insufficient documentation

## 2013-03-24 DIAGNOSIS — Z96649 Presence of unspecified artificial hip joint: Secondary | ICD-10-CM

## 2013-03-24 DIAGNOSIS — M129 Arthropathy, unspecified: Secondary | ICD-10-CM | POA: Diagnosis present

## 2013-03-24 DIAGNOSIS — I10 Essential (primary) hypertension: Secondary | ICD-10-CM | POA: Diagnosis present

## 2013-03-24 DIAGNOSIS — Z79899 Other long term (current) drug therapy: Secondary | ICD-10-CM | POA: Insufficient documentation

## 2013-03-24 DIAGNOSIS — F3289 Other specified depressive episodes: Secondary | ICD-10-CM | POA: Insufficient documentation

## 2013-03-24 LAB — CBC WITH DIFFERENTIAL/PLATELET
BASOS ABS: 0 10*3/uL (ref 0.0–0.1)
Basophils Relative: 0 % (ref 0–1)
Eosinophils Absolute: 0 10*3/uL (ref 0.0–0.7)
Eosinophils Relative: 1 % (ref 0–5)
HCT: 43.9 % (ref 39.0–52.0)
HEMOGLOBIN: 14.3 g/dL (ref 13.0–17.0)
Lymphocytes Relative: 24 % (ref 12–46)
Lymphs Abs: 1.8 10*3/uL (ref 0.7–4.0)
MCH: 24.9 pg — ABNORMAL LOW (ref 26.0–34.0)
MCHC: 32.6 g/dL (ref 30.0–36.0)
MCV: 76.3 fL — ABNORMAL LOW (ref 78.0–100.0)
MONOS PCT: 9 % (ref 3–12)
Monocytes Absolute: 0.7 10*3/uL (ref 0.1–1.0)
NEUTROS ABS: 4.8 10*3/uL (ref 1.7–7.7)
NEUTROS PCT: 66 % (ref 43–77)
Platelets: 137 10*3/uL — ABNORMAL LOW (ref 150–400)
RBC: 5.75 MIL/uL (ref 4.22–5.81)
RDW: 18.5 % — ABNORMAL HIGH (ref 11.5–15.5)
WBC: 7.3 10*3/uL (ref 4.0–10.5)

## 2013-03-24 LAB — BASIC METABOLIC PANEL
BUN: 4 mg/dL — AB (ref 6–23)
CHLORIDE: 98 meq/L (ref 96–112)
CO2: 23 meq/L (ref 19–32)
CREATININE: 0.79 mg/dL (ref 0.50–1.35)
Calcium: 9.4 mg/dL (ref 8.4–10.5)
GFR calc non Af Amer: >90 mL/min (ref >90)
GLUCOSE: 111 mg/dL — AB (ref 70–99)
Potassium: 4 mEq/L (ref 3.7–5.3)
Sodium: 137 mEq/L (ref 137–147)

## 2013-03-24 LAB — HEPATIC FUNCTION PANEL
ALK PHOS: 99 U/L (ref 39–117)
ALT: 31 U/L (ref 0–53)
AST: 42 U/L — ABNORMAL HIGH (ref 0–37)
Albumin: 4 g/dL (ref 3.5–5.2)
BILIRUBIN TOTAL: 0.5 mg/dL (ref 0.3–1.2)
Bilirubin, Direct: <0.2 mg/dL (ref 0.0–0.3)
TOTAL PROTEIN: 7.8 g/dL (ref 6.0–8.3)

## 2013-03-24 LAB — LIPASE, BLOOD: Lipase: 29 U/L (ref 11–59)

## 2013-03-24 LAB — RAPID URINE DRUG SCREEN, HOSP PERFORMED
Amphetamines: NOT DETECTED
Barbiturates: NOT DETECTED
Benzodiazepines: NOT DETECTED
COCAINE: POSITIVE — AB
OPIATES: NOT DETECTED
Tetrahydrocannabinol: NOT DETECTED

## 2013-03-24 LAB — ETHANOL: Alcohol, Ethyl (B): <11 mg/dL (ref 0–11)

## 2013-03-24 MED ORDER — CHLORDIAZEPOXIDE HCL 25 MG PO CAPS
25.0000 mg | ORAL_CAPSULE | Freq: Every day | ORAL | Status: AC
  Administered 2013-03-29: 25 mg via ORAL
  Filled 2013-03-24: qty 1

## 2013-03-24 MED ORDER — CHLORDIAZEPOXIDE HCL 25 MG PO CAPS
25.0000 mg | ORAL_CAPSULE | Freq: Four times a day (QID) | ORAL | Status: AC | PRN
  Administered 2013-03-26: 25 mg via ORAL
  Filled 2013-03-24 (×2): qty 1

## 2013-03-24 MED ORDER — LORAZEPAM 1 MG PO TABS
0.0000 mg | ORAL_TABLET | Freq: Four times a day (QID) | ORAL | Status: DC
  Administered 2013-03-24: 1 mg via ORAL
  Filled 2013-03-24: qty 1

## 2013-03-24 MED ORDER — HYDROXYZINE HCL 25 MG PO TABS
25.0000 mg | ORAL_TABLET | Freq: Four times a day (QID) | ORAL | Status: DC | PRN
  Administered 2013-03-26 – 2013-03-28 (×2): 25 mg via ORAL
  Filled 2013-03-24 (×2): qty 1

## 2013-03-24 MED ORDER — ALUM & MAG HYDROXIDE-SIMETH 200-200-20 MG/5ML PO SUSP: 30.0000 mL | ORAL | Status: DC | PRN

## 2013-03-24 MED ORDER — CLONIDINE HCL 0.1 MG PO TABS
0.1000 mg | ORAL_TABLET | Freq: Four times a day (QID) | ORAL | Status: AC
  Administered 2013-03-24 – 2013-03-27 (×9): 0.1 mg via ORAL
  Filled 2013-03-24 (×11): qty 1

## 2013-03-24 MED ORDER — LOPERAMIDE HCL 2 MG PO CAPS: 2.0000 mg | ORAL_CAPSULE | ORAL | Status: DC | PRN

## 2013-03-24 MED ORDER — NAPROXEN 500 MG PO TABS
500.0000 mg | ORAL_TABLET | Freq: Two times a day (BID) | ORAL | Status: DC | PRN
  Administered 2013-03-25 – 2013-03-29 (×6): 500 mg via ORAL
  Filled 2013-03-24 (×6): qty 1

## 2013-03-24 MED ORDER — CLONIDINE HCL 0.1 MG PO TABS
0.1000 mg | ORAL_TABLET | ORAL | Status: AC
  Administered 2013-03-26 – 2013-03-29 (×5): 0.1 mg via ORAL
  Filled 2013-03-24 (×4): qty 1

## 2013-03-24 MED ORDER — ACETAMINOPHEN 325 MG PO TABS: 650.0000 mg | ORAL_TABLET | Freq: Four times a day (QID) | ORAL | Status: DC | PRN

## 2013-03-24 MED ORDER — CLONIDINE HCL 0.1 MG PO TABS
0.1000 mg | ORAL_TABLET | Freq: Every day | ORAL | Status: DC
  Filled 2013-03-24 (×2): qty 1

## 2013-03-24 MED ORDER — NICOTINE 21 MG/24HR TD PT24
MEDICATED_PATCH | TRANSDERMAL | Status: AC
  Administered 2013-03-24: 21 mg via TRANSDERMAL
  Filled 2013-03-24: qty 1

## 2013-03-24 MED ORDER — THIAMINE HCL 100 MG/ML IJ SOLN
100.0000 mg | Freq: Once | INTRAMUSCULAR | Status: AC
  Administered 2013-03-24: 100 mg via INTRAMUSCULAR
  Filled 2013-03-24: qty 2

## 2013-03-24 MED ORDER — MIRTAZAPINE 15 MG PO TABS
45.0000 mg | ORAL_TABLET | Freq: Every day | ORAL | Status: DC
Start: 2013-03-24 — End: 2013-03-29
  Administered 2013-03-24 – 2013-03-28 (×5): 45 mg via ORAL
  Filled 2013-03-24 (×3): qty 1
  Filled 2013-03-24: qty 3
  Filled 2013-03-24 (×4): qty 1

## 2013-03-24 MED ORDER — VITAMIN B-1 100 MG PO TABS
100.0000 mg | ORAL_TABLET | Freq: Every day | ORAL | Status: DC
  Administered 2013-03-24: 100 mg via ORAL
  Filled 2013-03-24: qty 1

## 2013-03-24 MED ORDER — NICOTINE 21 MG/24HR TD PT24
21.0000 mg | MEDICATED_PATCH | Freq: Every day | TRANSDERMAL | Status: DC
  Administered 2013-03-24: 21 mg via TRANSDERMAL

## 2013-03-24 MED ORDER — GABAPENTIN 400 MG PO CAPS
400.0000 mg | ORAL_CAPSULE | Freq: Four times a day (QID) | ORAL | Status: DC
  Administered 2013-03-24 – 2013-03-29 (×18): 400 mg via ORAL
  Filled 2013-03-24 (×27): qty 1

## 2013-03-24 MED ORDER — QUETIAPINE FUMARATE 200 MG PO TABS
200.0000 mg | ORAL_TABLET | Freq: Every day | ORAL | Status: DC
  Administered 2013-03-24 – 2013-03-28 (×5): 200 mg via ORAL
  Filled 2013-03-24 (×8): qty 1

## 2013-03-24 MED ORDER — METHOCARBAMOL 500 MG PO TABS
500.0000 mg | ORAL_TABLET | Freq: Three times a day (TID) | ORAL | Status: DC | PRN
  Administered 2013-03-25 – 2013-03-27 (×5): 500 mg via ORAL
  Filled 2013-03-24 (×5): qty 1

## 2013-03-24 MED ORDER — THIAMINE HCL 100 MG/ML IJ SOLN
100.0000 mg | Freq: Every day | INTRAMUSCULAR | Status: DC
Start: 2013-03-24 — End: 2013-03-24

## 2013-03-24 MED ORDER — ONDANSETRON 4 MG PO TBDP: 4.0000 mg | ORAL_TABLET | Freq: Four times a day (QID) | ORAL | Status: DC | PRN

## 2013-03-24 MED ORDER — CHLORDIAZEPOXIDE HCL 25 MG PO CAPS
50.0000 mg | ORAL_CAPSULE | Freq: Once | ORAL | Status: AC
  Administered 2013-03-24: 50 mg via ORAL
  Filled 2013-03-24: qty 2

## 2013-03-24 MED ORDER — MAGNESIUM HYDROXIDE 400 MG/5ML PO SUSP
30.0000 mL | Freq: Every day | ORAL | Status: DC | PRN
  Administered 2013-03-26 – 2013-03-27 (×2): 30 mL via ORAL

## 2013-03-24 MED ORDER — CHLORDIAZEPOXIDE HCL 25 MG PO CAPS
25.0000 mg | ORAL_CAPSULE | Freq: Three times a day (TID) | ORAL | Status: AC
  Administered 2013-03-26 – 2013-03-27 (×3): 25 mg via ORAL
  Filled 2013-03-24 (×3): qty 1

## 2013-03-24 MED ORDER — DICYCLOMINE HCL 20 MG PO TABS: 20.0000 mg | ORAL_TABLET | Freq: Four times a day (QID) | ORAL | Status: DC | PRN

## 2013-03-24 MED ORDER — NICOTINE 21 MG/24HR TD PT24: 21.0000 mg | MEDICATED_PATCH | Freq: Once | TRANSDERMAL | Status: DC

## 2013-03-24 MED ORDER — CHLORDIAZEPOXIDE HCL 25 MG PO CAPS
25.0000 mg | ORAL_CAPSULE | Freq: Four times a day (QID) | ORAL | Status: AC
  Administered 2013-03-24 – 2013-03-25 (×5): 25 mg via ORAL
  Filled 2013-03-24 (×5): qty 1

## 2013-03-24 MED ORDER — CHLORDIAZEPOXIDE HCL 25 MG PO CAPS
25.0000 mg | ORAL_CAPSULE | ORAL | Status: AC
  Administered 2013-03-26 – 2013-03-27 (×2): 25 mg via ORAL
  Filled 2013-03-24: qty 1

## 2013-03-24 MED ORDER — LORAZEPAM 1 MG PO TABS: 0.0000 mg | ORAL_TABLET | Freq: Two times a day (BID) | ORAL | Status: DC

## 2013-03-24 MED ORDER — VITAMIN B-1 100 MG PO TABS
100.0000 mg | ORAL_TABLET | Freq: Every day | ORAL | Status: DC
  Administered 2013-03-25 – 2013-03-29 (×5): 100 mg via ORAL
  Filled 2013-03-24 (×7): qty 1

## 2013-03-24 MED ORDER — ONDANSETRON HCL 4 MG PO TABS: 4.0000 mg | ORAL_TABLET | Freq: Three times a day (TID) | ORAL | Status: DC | PRN

## 2013-03-24 MED ORDER — ESCITALOPRAM OXALATE 20 MG PO TABS
20.0000 mg | ORAL_TABLET | Freq: Every morning | ORAL | Status: DC
  Administered 2013-03-25 – 2013-03-29 (×4): 20 mg via ORAL
  Filled 2013-03-24 (×7): qty 1

## 2013-03-24 MED ORDER — ADULT MULTIVITAMIN W/MINERALS CH
1.0000 | ORAL_TABLET | Freq: Every day | ORAL | Status: DC
  Administered 2013-03-25 – 2013-03-29 (×5): 1 via ORAL
  Filled 2013-03-24 (×7): qty 1

## 2013-03-24 NOTE — ED Provider Notes (Signed)
CSN: [id number]     Arrival date & time 03/24/13  1435 History   First MD Initiated Contact with Patient 03/24/13 1642     Chief Complaint  Patient presents with  . V70.1      HPI Pt was seen at 1645. Per pt, c/o gradual onset and persistence of constant etoh and cocaine abuse for the past 2 weeks. Pt states his last detox from same was 3 months ago. States he "fell off the wagon" 2 weeks ago and began using both again daily. States "that makes me feel depressed." LD etoh 9am today. States he is "starting to feel a little shaky" currently. Denies SI, no HI. Denies CP/SOB, no abd pain, no N/V/D.     Past Medical History  Diagnosis Date  . Diabetic foot ulcer   . ETOH abuse   . Anxiety   . Open wound     bottom of foot  . Diabetes mellitus without complication     borderline  . Neuromuscular disorder     neuropathy  . GERD (gastroesophageal reflux disease)     tums  . Pneumonia ~ 2012  . History of blood transfusion     "related to left knee OR; probably right hip too" (04/21/2012)  . Stroke 2008    "they said I might have had one during right hip replacement" (04/21/2012)  . Arthritis     "everywhere" (04/21/2012)  . Mental disorder   . Depression   . DDD (degenerative disc disease)    Past Surgical History  Procedure Laterality Date  . Total hip arthroplasty Right 2008  . Total knee arthroplasty Left 2006  . Joint replacement    . Lung lobectomy Left ~ 2006  . Revision total hip arthroplasty Right 2008    "4-5 months after replacement" (04/21/2012)  . Knee arthroscopy Bilateral 1980's/1990's  . Lung lobectomy    . Metatarsal osteotomy  10/29/2011    Procedure: METATARSAL OSTEOTOMY;  Surgeon: [name], MD;  Location: [facility];  Service: Orthopedics;  Laterality: Left;  Left 1st Metatarsal Dorsal Closing Wedge   . Total knee arthroplasty Right 04/20/2012  . Total knee arthroplasty Right 04/20/2012    Procedure: TOTAL KNEE ARTHROPLASTY;  Surgeon: [name], MD;  Location: [facility];  Service: Orthopedics;  Laterality: Right;  Right Total Knee Arthroplasty    History  Substance Use Topics  . Smoking status: Current Every Day Smoker -- 1.50 packs/day for 30 years    Types: Cigarettes  . Smokeless tobacco: Never Used     Comment: 04/21/2012 offered smoking cessation materials; pt declines  . Alcohol Use: 0.0 oz/week     Comment: relapsed september 2014    Review of Systems ROS: Statement: All systems negative except as marked or noted in the HPI; Constitutional: Negative for fever and chills. ; ; Eyes: Negative for eye pain, redness and discharge. ; ; ENMT: Negative for ear pain, hoarseness, nasal congestion, sinus pressure and sore throat. ; ; Cardiovascular: Negative for chest pain, palpitations, diaphoresis, dyspnea and peripheral edema. ; ; Respiratory: Negative for cough, wheezing and stridor. ; ; Gastrointestinal: Negative for nausea, vomiting, diarrhea, abdominal pain, blood in stool, hematemesis, jaundice and rectal bleeding. . ; ; Genitourinary: Negative for dysuria, flank pain and hematuria. ; ; Musculoskeletal: Negative for back pain and neck pain. Negative for swelling and trauma.; ; Skin: Negative for pruritus, rash, abrasions, blisters, bruising and skin lesion.; ; Neuro: Negative for headache, lightheadedness and neck stiffness. Negative for  weakness, altered level of consciousness , altered mental status, extremity weakness, paresthesias, involuntary movement, seizure and syncope.;Psych:  No SI, no SA, no HI, no hallucinations.     Allergies  Benadryl and Trazodone and nefazodone  Home Medications   Current Outpatient Rx  Name  Route  Sig  Dispense  Refill  . clonazePAM (KLONOPIN) 0.5 MG tablet   Oral   Take 0.5 mg by mouth 3 (three) times daily as needed for anxiety.         Marland Kitchen escitalopram (LEXAPRO) 20 MG tablet   Oral   Take 20 mg by mouth every morning.         . gabapentin (NEURONTIN) 400 MG capsule   Oral   Take 1 capsule (400 mg total)  by mouth 4 (four) times daily. For anxiety/pain   120 capsule   1   . ibuprofen (ADVIL,MOTRIN) 200 MG tablet   Oral   Take 200 mg by mouth every 6 (six) hours as needed.         . mirtazapine (REMERON) 45 MG tablet   Oral   Take 45 mg by mouth at bedtime.         Marland Kitchen QUEtiapine (SEROQUEL) 200 MG tablet   Oral   Take 200 mg by mouth at bedtime.          BP 135/72  Pulse 86  Temp(Src) 98 F (36.7 C) (Oral)  Resp 16  Ht 6\' 3"  (1.905 m)  Wt 200 lb (90.719 kg)  BMI 25.00 kg/m2  SpO2 98% Physical Exam 1650: Physical examination:  Nursing notes reviewed; Vital signs and O2 SAT reviewed;  Constitutional: Well developed, Well nourished, Well hydrated, In no acute distress; Head:  Normocephalic, atraumatic; Eyes: EOMI, PERRL, No scleral icterus; ENMT: Mouth and pharynx normal, Mucous membranes moist; Neck: Supple, Full range of motion, No lymphadenopathy; Cardiovascular: Regular rate and rhythm, No gallop; Respiratory: Breath sounds clear & equal bilaterally, No rales, rhonchi, wheezes.  Speaking full sentences with ease, Normal respiratory effort/excursion; Chest: Nontender, Movement normal; Abdomen: Soft, Nontender, Nondistended, Normal bowel sounds; Genitourinary: No CVA tenderness; Extremities: Pulses normal, No tenderness, No edema, No calf edema or asymmetry.; Neuro: AA&Ox3, Major CN grossly intact.  Speech clear. No gross focal motor or sensory deficits in extremities.; Skin: Color normal, Warm, Dry.; Psych:  Affect flat, poor eye contact. Denies SI/HI.     ED Course  Procedures     EKG Interpretation None      MDM  MDM Reviewed: previous chart, nursing note and vitals Reviewed previous: labs Interpretation: labs   Results for orders placed during the hospital encounter of 03/24/13  CBC WITH DIFFERENTIAL      Result Value Ref Range   WBC 7.3  4.0 - 10.5 K/uL   RBC 5.75  4.22 - 5.81 MIL/uL   Hemoglobin 14.3  13.0 - 17.0 g/dL   HCT 43.9  39.0 - 52.0 %   MCV 76.3  (*) 78.0 - 100.0 fL   MCH 24.9 (*) 26.0 - 34.0 pg   MCHC 32.6  30.0 - 36.0 g/dL   RDW 18.5 (*) 11.5 - 15.5 %   Platelets 137 (*) 150 - 400 K/uL   Neutrophils Relative % 66  43 - 77 %   Neutro Abs 4.8  1.7 - 7.7 K/uL   Lymphocytes Relative 24  12 - 46 %   Lymphs Abs 1.8  0.7 - 4.0 K/uL   Monocytes Relative 9  3 - 12 %  Monocytes Absolute 0.7  0.1 - 1.0 K/uL   Eosinophils Relative 1  0 - 5 %   Eosinophils Absolute 0.0  0.0 - 0.7 K/uL   Basophils Relative 0  0 - 1 %   Basophils Absolute 0.0  0.0 - 0.1 K/uL  ETHANOL      Result Value Ref Range   Alcohol, Ethyl (B) <11  0 - 11 mg/dL  URINE RAPID DRUG SCREEN (HOSP PERFORMED)      Result Value Ref Range   Opiates NONE DETECTED  NONE DETECTED   Cocaine POSITIVE (*) NONE DETECTED   Benzodiazepines NONE DETECTED  NONE DETECTED   Amphetamines NONE DETECTED  NONE DETECTED   Tetrahydrocannabinol NONE DETECTED  NONE DETECTED   Barbiturates NONE DETECTED  NONE DETECTED  BASIC METABOLIC PANEL      Result Value Ref Range   Sodium 137  137 - 147 mEq/L   Potassium 4.0  3.7 - 5.3 mEq/L   Chloride 98  96 - 112 mEq/L   CO2 23  19 - 32 mEq/L   Glucose, Bld 111 (*) 70 - 99 mg/dL   BUN 4 (*) 6 - 23 mg/dL   Creatinine, Ser 0.79  0.50 - 1.35 mg/dL   Calcium 9.4  8.4 - 10.5 mg/dL   GFR calc non Af Amer >90  >90 mL/min   GFR calc Af Amer >90  >90 mL/min  LIPASE, BLOOD      Result Value Ref Range   Lipase 29  11 - 59 U/L  HEPATIC FUNCTION PANEL      Result Value Ref Range   Total Protein 7.8  6.0 - 8.3 g/dL   Albumin 4.0  3.5 - 5.2 g/dL   AST 42 (*) 0 - 37 U/L   ALT 31  0 - 53 U/L   Alkaline Phosphatase 99  39 - 117 U/L   Total Bilirubin 0.5  0.3 - 1.2 mg/dL   Bilirubin, Direct <0.2  0.0 - 0.3 mg/dL   Indirect Bilirubin NOT CALCULATED  0.3 - 0.9 mg/dL    1850:  Pt continues to deny SI/HI. TTS has evaluated pt: pt is accepted to [facility] Dr. Sabra Heck. Will transfer stable.       Alfonzo Feller, DO 03/27/13 1535

## 2013-03-24 NOTE — ED Notes (Signed)
States he has ran out of money for cocaine and alcohol

## 2013-03-24 NOTE — ED Notes (Signed)
Patient stable.  Transported via [facility].

## 2013-03-24 NOTE — ED Notes (Signed)
telepsych in progress 

## 2013-03-24 NOTE — BH Assessment (Addendum)
[facility] Assessment Progress Note  Spoke to Dr Thurnell Garbe in anticipation of TTS assessment scheduled for 18:00.  Jalene Mullet, MA Triage Specialist 03/24/2013 @ 16:58

## 2013-03-24 NOTE — ED Notes (Addendum)
Pt  Says he wants detox from etoh and cocaine. Pt denies IV drugs.    Drank beer 12 pack at 9 am

## 2013-03-24 NOTE — ED Notes (Signed)
Report called to Vaughan Basta, [place] at [facility]. [facility] transport present for transport.

## 2013-03-24 NOTE — BH Assessment (Addendum)
Tele Assessment Note   [name] is a 49 y.o. divorce white male.  He presents at [facility] requesting detoxification from alcohol and crack cocaine after relapsing about 2 weeks ago.  Stressors: Pt reports that he remained sober for about 3 months following his most recent admission to [facility] in 11/2012.  Two weeks ago pt reports that he became bored from being alone in his home during a recent period of severe weather, occasioning his relapse on alcohol.  He reports that he had not used cocaine for a long time, but a friend came over and had crack on hand, resulting in his relapse on that substance as well.  Today he ran out of money to support his addiction, and started feeling ill due to withdrawal symptoms, resulting in his decision to seek treatment.  Lethality: Suicidality:  Pt denies SI currently or at any time in the past.  He denies any history of suicide attempts, or of self mutilation.  Pt endorses depressed mood with symptoms noted in the "risk to self" assessment below. Homicidality: Pt denies homicidal thoughts or physical aggression.  Pt denies having access to firearms.  Pt denies having any legal problems at this time.  Pt is calm and cooperative during assessment. Psychosis: Pt denies hallucinations.  Pt does not appear to be responding to internal stimuli and exhibits no delusional thought.  Pt's reality testing appears to be intact. Substance Abuse: Pt reports that for the past 2 weeks he has been drinking about 24 beers daily, with first use at 48 y/o and most recent use of 12 beers ending around 09:00 today, 03/24/2013.  His first use of cocaine was at 48 y/o, and over the past 2 weeks he has been using about 2 grams of crack on a daily basis, with most recent use being night before last (03/22/2013).  He denies having problems with any other substances.  Pt reports a history of a single withdrawal seizure about 4 - 5 years ago.  He reports a number of current withdrawal symptoms as noted in  the "Additional Social History" section below, with a CIWA score of 2.  Social History: Pt identifies his parents, his sister and his church community as supports.  As noted, he lives alone.  He reports that his father had problems with alcohol abuse, but after having a stroke about 10 years ago he stopped abruptly and has not relapsed.  Pt reports that he went to a trade school in the past, but he is now a recipient of disability benefits.  Treatment History: Pt has been admitted to [facility] about 9 time since 2008, with the most recent in 11/2012.  He currently receives outpatient psychiatry and therapy through [facility].  He has been a client of [facility] in [place] in the past.  Today he is requesting admission to [facility] for detoxification.   Axis I: Alcohol Dependence 303.90; Cocaine Dependence 304.20; Depressive Disorder NOS 311 Axis II: Deferred 799.9 Axis III:  Past Medical History  Diagnosis Date  . Diabetic foot ulcer   . ETOH abuse   . Anxiety   . Open wound     bottom of foot  . Diabetes mellitus without complication     borderline  . Neuromuscular disorder     neuropathy  . GERD (gastroesophageal reflux disease)     tums  . Pneumonia ~ 2012  . History of blood transfusion     "related to left knee OR; probably right hip too" (  04/21/2012)  . Stroke 2008    "they said I might have had one during right hip replacement" (04/21/2012)  . Arthritis     "everywhere" (04/21/2012)  . Mental disorder   . Depression   . DDD (degenerative disc disease)    Axis IV: economic problems, problems related to social environment and problems with primary support group Axis V: GAF = 40  Past Medical History:  Past Medical History  Diagnosis Date  . Diabetic foot ulcer   . ETOH abuse   . Anxiety   . Open wound     bottom of foot  . Diabetes mellitus without complication     borderline  . Neuromuscular disorder     neuropathy  . GERD (gastroesophageal reflux disease)      tums  . Pneumonia ~ 2012  . History of blood transfusion     "related to left knee OR; probably right hip too" (04/21/2012)  . Stroke 2008    "they said I might have had one during right hip replacement" (04/21/2012)  . Arthritis     "everywhere" (04/21/2012)  . Mental disorder   . Depression   . DDD (degenerative disc disease)     Past Surgical History  Procedure Laterality Date  . Total hip arthroplasty Right 2008  . Total knee arthroplasty Left 2006  . Joint replacement    . Lung lobectomy Left ~ 2006  . Revision total hip arthroplasty Right 2008    "4-5 months after replacement" (04/21/2012)  . Knee arthroscopy Bilateral 1980's/1990's  . Lung lobectomy    . Metatarsal osteotomy  10/29/2011    Procedure: METATARSAL OSTEOTOMY;  Surgeon: [name], MD;  Location: [facility];  Service: Orthopedics;  Laterality: Left;  Left 1st Metatarsal Dorsal Closing Wedge   . Total knee arthroplasty Right 04/20/2012  . Total knee arthroplasty Right 04/20/2012    Procedure: TOTAL KNEE ARTHROPLASTY;  Surgeon: [name], MD;  Location: [place];  Service: Orthopedics;  Laterality: Right;  Right Total Knee Arthroplasty    Family History: History reviewed. No pertinent family history.  Social History:  reports that he has been smoking Cigarettes.  He has a 45 pack-year smoking history. He has never used smokeless tobacco. He reports that he drinks alcohol. He reports that he uses illicit drugs (Marijuana and Cocaine).  Additional Social History:  Alcohol / Drug Use Pain Medications: Denies Prescriptions: Denies Over the Counter: Denies Longest period of sobriety (when/how long): 5 yrs from 2000-2005; Pt was sober for 3 months prior to relapse 2 weeks ago. Withdrawal Symptoms: Seizures;Cramps;Tremors Onset of Seizures: 4 - 5 years ago Date of most recent seizure: Single occurance 4 - 5 years ago Substance #1 Name of Substance 1: Alcohol 1 - Age of First Use: 48 y/o 1 - Amount (size/oz): 24 beers 1 -  Frequency: Daily 1 - Duration: Past 2 weeks 1 - Last Use / Amount: 12 beers @ 09:00 today (03/24/2013) Substance #2 Name of Substance 2: Cocaine (crack) 2 - Age of First Use: 48 y/o 2 - Amount (size/oz): 2 grams 2 - Frequency: Daily 2 - Duration: Past 2 weeks 2 - Last Use / Amount: 2 grams night before last (03/22/2013)  CIWA: CIWA-Ar BP: 147/86 mmHg Pulse Rate: 84 Nausea and Vomiting: no nausea and no vomiting Tactile Disturbances: none Tremor: not visible, but can be felt fingertip to fingertip Auditory Disturbances: not present Paroxysmal Sweats: no sweat visible Visual Disturbances: not present Anxiety: mildly anxious Headache, Fullness in  Head: none present Agitation: normal activity Orientation and Clouding of Sensorium: oriented and can do serial additions CIWA-Ar Total: 2 COWS:    Allergies:  Allergies  Allergen Reactions  . Benadryl [Diphenhydramine Hcl] Other (See Comments)    Pt states leg spasm  . Trazodone And Nefazodone Other (See Comments)    Leg Spasms.     Home Medications:  (Not in a hospital admission)  OB/GYN Status:  No LMP for male patient.  General Assessment Data Location of Assessment: [facility] Is this a Tele or Face-to-Face Assessment?: Tele Assessment Is this an Initial Assessment or a Re-assessment for this encounter?: Initial Assessment Living Arrangements: Alone Can pt return to current living arrangement?: Yes Admission Status: Voluntary Is patient capable of signing voluntary admission?: Yes Transfer from: [facility] Referral Source: Other ([facility])     [facility] Crisis Care Plan Living Arrangements: Alone Name of Psychiatrist: [facility] Name of Therapist: [facility]  Education Status Is patient currently in school?: No Highest grade of school patient has completed: Vocational school Contact person: William Dalton (sister) [phone]  Risk to self Suicidal Ideation: No Suicidal Intent: No Is patient at risk for  suicide?: No Suicidal Plan?: No Access to Means: No What has been your use of drugs/alcohol within the last 12 months?: Daily use of alcohol and crack cocaine x 2 weeks. Previous Attempts/Gestures: No How many times?: 0 Other Self Harm Risks: Depression, social isolation, substance abuse, some neglect of self care. Triggers for Past Attempts: Other (Comment) (Not applicable) Intentional Self Injurious Behavior: None Family Suicide History: No Recent stressful life event(s): Financial Problems;Other (Comment) (Boredom in home; Spent money on crack & alcohol) Persecutory voices/beliefs?: No Depression: Yes Depression Symptoms: Insomnia;Fatigue;Guilt;Feeling worthless/self pity (Hopelessness) Substance abuse history and/or treatment for substance abuse?: Yes (Daily use of alcohol and crack cocaine x 2 weeks.) Suicide prevention information given to non-admitted patients: Not applicable (To be admitted to [facility])  Risk to Others Homicidal Ideation: No Thoughts of Harm to Others: No Current Homicidal Intent: No Current Homicidal Plan: No Access to Homicidal Means: No Identified Victim: None History of harm to others?: No Assessment of Violence: None Noted Violent Behavior Description: Calm/cooperative Does patient have access to weapons?: No (Denies having firearms) Criminal Charges Pending?: No Does patient have a court date: No  Psychosis Hallucinations: None noted Delusions: None noted  Mental Status Report Appear/Hygiene: Other (Comment) (Paper scrubs) Eye Contact: Good Motor Activity: Unremarkable Speech: Other (Comment) (Flat prosody, otherwise unremarkable) Level of Consciousness: Alert Mood: Depressed;Other (Comment) (Jovial during assessment; reports depression) Affect: Inconsistent with thought content Anxiety Level: Panic Attacks Panic attack frequency: Daily Most recent panic attack: Today Thought Processes: Coherent;Relevant Judgement: Unimpaired Orientation:  Person;Place;Time;Situation Obsessive Compulsive Thoughts/Behaviors: None  Cognitive Functioning Concentration: Decreased Memory: Recent Intact;Remote Intact IQ: Average Insight: Fair Impulse Control: Fair Appetite: Fair Weight Loss: 0 Weight Gain: 0 Sleep: Decreased Total Hours of Sleep: 3 (2 - 3 hrs/night x 1 week) Vegetative Symptoms: Staying in bed;Not bathing;Decreased grooming  ADLScreening [facility] Assessment Services) Patient's cognitive ability adequate to safely complete daily activities?: Yes Patient able to express need for assistance with ADLs?: Yes Independently performs ADLs?: Yes (appropriate for developmental age)  Prior Inpatient Therapy Prior Inpatient Therapy: Yes Prior Therapy Dates: 11/2012: most recent of 47 [facility] admissions for detox since 07/2006  Prior Outpatient Therapy Prior Outpatient Therapy: Yes Prior Therapy Dates: Current: [facility] for [facility]ry & therapy Prior Therapy Facilty/Provider(s): Past: [facility] in [place]  ADL Screening (condition  at time of admission) Patient's cognitive ability adequate to safely complete daily activities?: Yes Is the patient deaf or have difficulty hearing?: No Does the patient have difficulty seeing, even when wearing glasses/contacts?: No Does the patient have difficulty concentrating, remembering, or making decisions?: No Patient able to express need for assistance with ADLs?: Yes Does the patient have difficulty dressing or bathing?: No Independently performs ADLs?: Yes (appropriate for developmental age) Does the patient have difficulty walking or climbing stairs?: No Weakness of Legs: None Weakness of Arms/Hands: None  Home Assistive Devices/Equipment Home Assistive Devices/Equipment: Eyeglasses    Abuse/Neglect Assessment (Assessment to be complete while patient is alone) Physical Abuse: Denies Verbal Abuse: Denies Sexual Abuse: Denies Exploitation of patient/patient's resources:  Denies Self-Neglect: Denies Values / Beliefs Cultural Requests During Hospitalization: None Spiritual Requests During Hospitalization: None   Advance Directives (For Healthcare) Advance Directive: Patient does not have advance directive (Tele-assessment: unable to provide packet.) Pre-existing out of facility DNR order (yellow form or pink MOST form): No Nutrition Screen- [facility] Adult/[facility] Patient's home diet: Carb modified  Additional Information 1:1 In Past 12 Months?: No CIRT Risk: No Elopement Risk: No Does patient have medical clearance?: Yes     Disposition:  Disposition Initial Assessment Completed for this Encounter: Yes Disposition of Patient: Inpatient treatment program Type of inpatient treatment program: Adult After consulting with Carlton Adam, MD it has been determined that pt does not present a life threatening danger to himself or others, but that he would benefit from inpatient detoxification.  He accepts pt to [facility] to his own service, Rm 302-1.  At 18:49 I spoke to EDP Dr Thurnell Garbe who concurs with decision.  At 18:51 I spoke to pt's nurse, Katharine Look, to notify her.  She agrees to have pt sign Voluntary Admission and Consent for Treatment, to fax the signed form to [phone], and to send the original with the pt.  She will call nurse-to-nurse report to [phone].  Jalene Mullet, MA Triage Specialist [facility] 03/24/2013 7:08 PM

## 2013-03-25 DIAGNOSIS — F1910 Other psychoactive substance abuse, uncomplicated: Secondary | ICD-10-CM

## 2013-03-25 DIAGNOSIS — F411 Generalized anxiety disorder: Secondary | ICD-10-CM

## 2013-03-25 DIAGNOSIS — F1010 Alcohol abuse, uncomplicated: Secondary | ICD-10-CM

## 2013-03-25 DIAGNOSIS — F332 Major depressive disorder, recurrent severe without psychotic features: Secondary | ICD-10-CM

## 2013-03-25 DIAGNOSIS — F410 Panic disorder [episodic paroxysmal anxiety] without agoraphobia: Secondary | ICD-10-CM

## 2013-03-25 LAB — GLUCOSE, CAPILLARY
GLUCOSE-CAPILLARY: 117 mg/dL — AB (ref 70–99)
Glucose-Capillary: 163 mg/dL — ABNORMAL HIGH (ref 70–99)

## 2013-03-25 MED ORDER — NICOTINE 21 MG/24HR TD PT24
21.0000 mg | MEDICATED_PATCH | Freq: Every day | TRANSDERMAL | Status: DC
  Administered 2013-03-25 – 2013-03-29 (×5): 21 mg via TRANSDERMAL
  Filled 2013-03-25 (×7): qty 1

## 2013-03-25 NOTE — [facility] Suicide Risk Assessment (Signed)
Suicide Risk Assessment  Admission Assessment     Nursing information obtained from:  Patient Demographic factors:  Male;Divorced or widowed;Caucasian;Living alone;Unemployed Current Mental Status:  NA Loss Factors:  Financial problems / change in socioeconomic status;Decline in physical health Historical Factors:  Family history of mental illness or substance abuse Risk Reduction Factors:  Positive social support Total Time spent with patient: 1 hour  CLINICAL FACTORS:   Depression:   Anhedonia Comorbid alcohol abuse/dependence Hopelessness Impulsivity Dysthymia Alcohol/Substance Abuse/Dependencies Chronic Pain Unstable or Poor Therapeutic Relationship Previous Psychiatric Diagnoses and Treatments Medical Diagnoses and Treatments/Surgeries  Psychiatric Specialty Exam:     Blood pressure 115/78, pulse 110, temperature 97.5 F (36.4 C), temperature source Oral, resp. rate 18, height 6\' 2"  (1.88 m), weight 92.987 kg (205 lb), SpO2 98.00%.Body mass index is 26.31 kg/(m^2).  General Appearance: Casual  Eye Contact::  Fair  Speech:  Slow  Volume:  Decreased  Mood:  Dysphoric  Affect:  Congruent  Thought Process:  Coherent  Orientation:  Full (Time, Place, and Person)  Thought Content:  Rumination  Suicidal Thoughts:  Yes.  without intent/plan  Homicidal Thoughts:  No  Memory:  Recent;   Fair  Judgement:  Impaired  Insight:  Lacking  Psychomotor Activity:  Decreased  Concentration:  Fair  Recall:  [place]: Fair  Akathisia:  Negative  Handed:  Right  AIMS (if indicated):     Assets:  Communication Skills Resilience  Sleep:  Number of Hours: 6   Musculoskeletal: Strength & Muscle Tone: within normal limits Gait & Station: unsteady Patient leans: N/A  COGNITIVE FEATURES THAT CONTRIBUTE TO RISK:  Closed-mindedness Polarized thinking    SUICIDE RISK:   Moderate:  Frequent suicidal ideation with limited intensity, and duration, some  specificity in terms of plans, no associated intent, good self-control, limited dysphoria/symptomatology, some risk factors present, and identifiable protective factors, including available and accessible social support.  PLAN OF CARE:  I certify that inpatient services furnished can reasonably be expected to improve the patient's condition.  De Nurse, Larsen Dungan MD 03/25/2013, 9:24 AM

## 2013-03-25 NOTE — B[facility] Group Notes (Signed)
[place] Group Notes:  (Clinical Social Work)  03/25/2013     10-11AM  Summary of Progress/Problems:   The main focus of today's process group was for the patient to identify ways in which they have in the past sabotaged their own recovery. Motivational Interviewing and a worksheet were utilized to help patients explore in depth the perceived benefits and costs of their substance use, as well as the potential benefits and costs of stopping.  The Stages of Change were explained using a handout, with an emphasis on making plans to deal with sabotaging behaviors proactivelky.  The patient expressed that the self-sabotaging behavior he uses is picking up his phone to call the wrong people.  The patient tried to stay awake, but kept nodding off and slept through most of group.  Type of Therapy:  Group Therapy - Process   Participation Level:  Minimal  Participation Quality:  Drowsy  Affect:  Blunted  Cognitive:  Oriented  Insight:  Limited  Engagement in Therapy:  Limited  Modes of Intervention:  Education, Support and Processing, Motivational Interviewing  Selmer Dominion, LCSW 03/25/2013, 12:24 PM

## 2013-03-25 NOTE — H&P (Signed)
Psychiatric Admission Assessment Adult  Patient Identification:  [name] Date of Evaluation:  03/25/2013 Chief Complaint:  ETOH DEPENDENCE, COCAINE DEPENDENCE DEPRESSIVE DISORDER NOS History of Present Illness:: [name] is a 48 year old male who presented to [facility] for detox from alcohol and cocaine. Pt was transported to [facility] via private vehicle driven by his parents. His plan was to seek help and come here to behavioral health for help. He reports running out of money,and couldn't get any more beer or nothing and he started getting sick (shaking sweating, vomiting). His last drink was Friday morning at 9am, 12pk of 12 ounce beers. He reports a history of drinking for the last 2.5 weeks, he is unable to contribute this to anything. He states a friend came over and he started drinking. "Once I get started drinking I cant stop, it is the best thing in the world". Previous hospital admission was 03/2012 and 11/2012 for alcohol detox as well. He states he just started doing cocaine about 2.5 weeks, and before that he was sober for about 3 months. Alcohol makes me feel like I don't have a care in the world, bills made it worst, but then spending it all on drugs and alcohol doesn't help. Currently rates his depression about a 6/10, and anxiety at 9/10. Reports resting well last night with Seroquel.  Elements:  Location:  [facility]. Quality:  Increased by drugs and alcohol. Severity:  Severe. Timing:  ABout 3 weeks ago. Duration:  Acute. Context:  Icnreased use of drugs. . Associated Signs/Synptoms: Depression Symptoms:  depressed mood, psychomotor agitation, fatigue, feelings of worthlessness/guilt, hopelessness, anxiety, panic attacks, (Hypo) Manic Symptoms:   Anxiety Symptoms:  Excessive Worry, Panic Symptoms, Social Anxiety, Psychotic Symptoms:   PTSD Symptoms: NA Total Time spent with patient: 30 minutes  Psychiatric Specialty Exam: Physical Exam  Constitutional: He is oriented to  person, place, and time. He appears well-developed and well-nourished.  Eyes: Pupils are equal, round, and reactive to light.  Neck: Normal range of motion.  Cardiovascular: Normal rate.   Respiratory: Effort normal.  GI: Soft.  Musculoskeletal: Normal range of motion.  Neurological: He is alert and oriented to person, place, and time.  Skin: Skin is warm and dry.  Psychiatric: He has a normal mood and affect. His behavior is normal. Judgment and thought content normal.    Review of Systems  Psychiatric/Behavioral: Positive for depression and substance abuse. The patient is nervous/anxious and has insomnia.   All other systems reviewed and are negative.    Blood pressure 123/82, pulse 96, temperature 97.3 F (36.3 C), temperature source Oral, resp. rate 16, height $RemoveBe'6\' 2"'BBiFHAcZF$  (1.88 m), weight 92.987 kg (205 lb), SpO2 98.00%.Body mass index is 26.31 kg/(m^2).  General Appearance: Casual and Fairly Groomed  Engineer, water::  Fair  Speech:  Clear and Coherent and Normal Rate  Volume:  Normal  Mood:  Anxious and Depressed  Affect:  Depressed and Flat  Thought Process:  Goal Directed and Intact  Orientation:  Full (Time, Place, and Person)  Thought Content:  WDL  Suicidal Thoughts:  No  Homicidal Thoughts:  No  Memory:  Immediate;   Fair Recent;   Fair Remote;   Fair  Judgement:  Fair  Insight:  Fair  Psychomotor Activity:  Normal  Concentration:  Good  Recall:  Good  Fund of Knowledge:Good  Language: Good  Akathisia:  No  Handed:  Right  AIMS (if indicated):     Assets:  Communication Skills Desire  for Improvement Physical Health Transportation  Sleep:  Number of Hours: 6    Musculoskeletal: Strength & Muscle Tone: within normal limits Gait & Station: normal Patient leans: N/A  Past Psychiatric History: Diagnosis:Generalized Anxiety, Depression, panic attacks  Hospitalizations: [facility] 3/14 and 11/14  Outpatient Care: Dr. Ok Palmer  [facility]stance Abuse Care: [facility]   Self-Mutilation: 0  Suicidal Attempts:0  Violent Behaviors:0   Past Medical History:   Past Medical History  Diagnosis Date  . Diabetic foot ulcer   . ETOH abuse   . Anxiety   . Open wound     bottom of foot  . Diabetes mellitus without complication     borderline  . Neuromuscular disorder     neuropathy  . GERD (gastroesophageal reflux disease)     tums  . Pneumonia ~ 2012  . History of blood transfusion     "related to left knee OR; probably right hip too" (04/21/2012)  . Stroke 2008    "they said I might have had one during right hip replacement" (04/21/2012)  . Arthritis     "everywhere" (04/21/2012)  . Mental disorder   . Depression   . 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 Benadryl [Diphenhydramine Hcl] Other (See Comments)    Pt states leg spasm  . Trazodone And Nefazodone Other (See Comments)    Leg Spasms.    PTA Medications: Prescriptions prior to admission  Medication Sig Dispense Refill  . clonazePAM (KLONOPIN) 0.5 MG tablet Take 0.5 mg by mouth 3 (three) times daily as needed for anxiety.      Marland Kitchen escitalopram (LEXAPRO) 20 MG tablet Take 20 mg by mouth every morning.      . gabapentin (NEURONTIN) 400 MG capsule Take 1 capsule (400 mg total) by mouth 4 (four) times daily. For anxiety/pain  120 capsule  1  . ibuprofen (ADVIL,MOTRIN) 200 MG tablet Take 200 mg by mouth every 6 (six) hours as needed.      . mirtazapine (REMERON) 45 MG tablet Take 45 mg by mouth at bedtime.      Marland Kitchen QUEtiapine (SEROQUEL) 200 MG tablet Take 200 mg by mouth at bedtime.        Previous Psychotropic Medications:  Medication/Dose  Klonipin  Lexapro Neurotin  Seroquel  Remeron         Substance Abuse History in the last 12 months:  yes  Consequences of Substance  Abuse: Medical Consequences:  Liver damage, Possible death by overdose Legal Consequences:  Arrests, jail time, Loss of driving privilege. Family Consequences:  Family discord, divorce and or separation.  Social History:  reports that he has been smoking Cigarettes.  He has a 45 pack-year smoking history. He has never used smokeless tobacco. He reports that he drinks alcohol. He reports that he uses illicit drugs (Marijuana and Cocaine). Additional Social History:  Current Place of Residence: [place] Place of Birth:  [place], [place] Family Members: Mother, SIster and Father Marital Status:  Divorced Children:1  Sons:1  Daughters: Relationships: None  Education:  Dentist Problems/Performance:  Religious Beliefs/Practices: Baptist History of Abuse (Emotional/Phsycial/Sexual) None Occupational Experiences; Sports coach History:  None. Legal History:None Hobbies/Interests: Yard work, gardening,   Family History:  History reviewed. No pertinent family history.  Results for orders placed during the hospital encounter of  03/24/13 (from the past 72 hour(s))  CBC WITH DIFFERENTIAL     Status: Abnormal   Collection Time    03/24/13  3:36 PM      Result Value Ref Range   WBC 7.3  4.0 - 10.5 K/uL   RBC 5.75  4.22 - 5.81 MIL/uL   Hemoglobin 14.3  13.0 - 17.0 g/dL   HCT 43.9  39.0 - 52.0 %   MCV 76.3 (*) 78.0 - 100.0 fL   MCH 24.9 (*) 26.0 - 34.0 pg   MCHC 32.6  30.0 - 36.0 g/dL   RDW 18.5 (*) 11.5 - 15.5 %   Platelets 137 (*) 150 - 400 K/uL   Neutrophils Relative % 66  43 - 77 %   Neutro Abs 4.8  1.7 - 7.7 K/uL   Lymphocytes Relative 24  12 - 46 %   Lymphs Abs 1.8  0.7 - 4.0 K/uL   Monocytes Relative 9  3 - 12 %   Monocytes Absolute 0.7  0.1 - 1.0 K/uL   Eosinophils Relative 1  0 - 5 %   Eosinophils Absolute 0.0  0.0 - 0.7 K/uL   Basophils Relative 0  0 - 1 %   Basophils Absolute 0.0  0.0 - 0.1 K/uL  ETHANOL     Status: None   Collection Time     03/24/13  3:36 PM      Result Value Ref Range   Alcohol, Ethyl (B) <11  0 - 11 mg/dL   Comment:            LOWEST DETECTABLE LIMIT FOR     SERUM ALCOHOL IS 11 mg/dL     FOR MEDICAL PURPOSES ONLY  BASIC METABOLIC PANEL     Status: Abnormal   Collection Time    03/24/13  4:37 PM      Result Value Ref Range   Sodium 137  137 - 147 mEq/L   Potassium 4.0  3.7 - 5.3 mEq/L   Chloride 98  96 - 112 mEq/L   CO2 23  19 - 32 mEq/L   Glucose, Bld 111 (*) 70 - 99 mg/dL   BUN 4 (*) 6 - 23 mg/dL   Creatinine, Ser 0.79  0.50 - 1.35 mg/dL   Calcium 9.4  8.4 - 10.5 mg/dL   GFR calc non Af Amer >90  >90 mL/min   GFR calc Af Amer >90  >90 mL/min   Comment: (NOTE)     The eGFR has been calculated using the CKD EPI equation.     This calculation has not been validated in all clinical situations.     eGFR's persistently <90 mL/min signify possible Chronic Kidney     Disease.  LIPASE, BLOOD     Status: None   Collection Time    03/24/13  5:05 PM      Result Value Ref Range   Lipase 29  11 - 59 U/L  HEPATIC FUNCTION PANEL     Status: Abnormal   Collection Time    03/24/13  5:05 PM      Result Value Ref Range   Total Protein 7.8  6.0 - 8.3 g/dL   Albumin 4.0  3.5 - 5.2 g/dL   AST 42 (*) 0 - 37 U/L   ALT 31  0 - 53 U/L   Alkaline Phosphatase 99  39 - 117 U/L   Total Bilirubin 0.5  0.3 - 1.2 mg/dL   Bilirubin, Direct <0.2  0.0 -  0.3 mg/dL   Indirect Bilirubin NOT CALCULATED  0.3 - 0.9 mg/dL  URINE RAPID DRUG SCREEN (HOSP PERFORMED)     Status: Abnormal   Collection Time    03/24/13  7:05 PM      Result Value Ref Range   Opiates NONE DETECTED  NONE DETECTED   Cocaine POSITIVE (*) NONE DETECTED   Benzodiazepines NONE DETECTED  NONE DETECTED   Amphetamines NONE DETECTED  NONE DETECTED   Tetrahydrocannabinol NONE DETECTED  NONE DETECTED   Barbiturates NONE DETECTED  NONE DETECTED   Comment:            DRUG SCREEN FOR MEDICAL PURPOSES     ONLY.  IF CONFIRMATION IS NEEDED     FOR ANY PURPOSE,  NOTIFY LAB     WITHIN 5 DAYS.                LOWEST DETECTABLE LIMITS     FOR URINE DRUG SCREEN     Drug Class       Cutoff (ng/mL)     Amphetamine      1000     Barbiturate      200     Benzodiazepine   229     Tricyclics       798     Opiates          300     Cocaine          300     THC              50   Psychological Evaluations:  Assessment:   DSM5:  Schizophrenia Disorders:   Obsessive-Compulsive Disorders:   Trauma-Stressor Disorders:   Substance/Addictive Disorders:  Alcohol Intoxication with Use Disorder - Severe (F10.229),Cocaine Abuse Depressive Disorders:  Major Depressive Disorder - Severe (296.23)  AXIS I:  Alcohol Abuse, Generalized Anxiety Disorder, Major Depression, Recurrent severe, Panic Disorder and Substance Abuse AXIS II:  Deferred AXIS III:   Past Medical History  Diagnosis Date  . Diabetic foot ulcer   . ETOH abuse   . Anxiety   . Open wound     bottom of foot  . Diabetes mellitus without complication     borderline  . Neuromuscular disorder     neuropathy  . GERD (gastroesophageal reflux disease)     tums  . Pneumonia ~ 2012  . History of blood transfusion     "related to left knee OR; probably right hip too" (04/21/2012)  . Stroke 2008    "they said I might have had one during right hip replacement" (04/21/2012)  . Arthritis     "everywhere" (04/21/2012)  . Mental disorder   . Depression   . DDD (degenerative disc disease)    AXIS IV:  economic problems, educational problems, problems related to social environment, problems with access to health care services and problems with primary support group AXIS V:  41-50 serious symptoms  Treatment Plan/Recommendations:  1 Admit for crisis management and stabilization.  2. Medication management to reduce symptoms to baseline and improved the patient's overall level of functioning. Closely monitor the side effects, efficacy and therapeutic response of medication.  3. Treat health problem as  indicated.  4. Developed treatment plan to decrease the risk of relapse upon discharge and to reduce the need for readmission.  5. Psychosocial education regarding relapse prevention in self-care.  6. Healthcare followup as needed for medical problems and called consults as indicated.  7. Increase collateral information.  8. Restart home medication where appropriate  9. Encouraged to participate and verbalize into group milieu therapy.  10. Continue Librium detox which is already in progress.   Treatment Plan Summary: Daily contact with patient to assess and evaluate symptoms and progress in treatment Medication management Current Medications:  Current Facility-Administered Medications  Medication Dose Route Frequency Provider Last Rate Last Dose  . acetaminophen (TYLENOL) tablet 650 mg  650 mg Oral Q6H PRN Lurena Nida, NP      . alum & mag hydroxide-simeth (MAALOX/MYLANTA) 200-200-20 MG/5ML suspension 30 mL  30 mL Oral Q4H PRN Lurena Nida, NP      . chlordiazePOXIDE (LIBRIUM) capsule 25 mg  25 mg Oral Q6H PRN Lurena Nida, NP      . chlordiazePOXIDE (LIBRIUM) capsule 25 mg  25 mg Oral QID Lurena Nida, NP   25 mg at 03/25/13 1204   Followed by  . [START ON 03/26/2013] chlordiazePOXIDE (LIBRIUM) capsule 25 mg  25 mg Oral TID Lurena Nida, NP       Followed by  . [START ON 03/27/2013] chlordiazePOXIDE (LIBRIUM) capsule 25 mg  25 mg Oral BH-qamhs Lurena Nida, NP       Followed by  . [START ON 03/29/2013] chlordiazePOXIDE (LIBRIUM) capsule 25 mg  25 mg Oral Daily Lurena Nida, NP      . cloNIDine (CATAPRES) tablet 0.1 mg  0.1 mg Oral QID Lurena Nida, NP   0.1 mg at 03/25/13 1204   Followed by  . [START ON 03/27/2013] cloNIDine (CATAPRES) tablet 0.1 mg  0.1 mg Oral BH-qamhs Lurena Nida, NP       Followed by  . [START ON 03/30/2013] cloNIDine (CATAPRES) tablet 0.1 mg  0.1 mg Oral QAC breakfast Lurena Nida, NP      . dicyclomine (BENTYL) tablet 20 mg  20 mg Oral Q6H PRN Lurena Nida,  NP      . escitalopram (LEXAPRO) tablet 20 mg  20 mg Oral q morning - 10a Lurena Nida, NP   20 mg at 03/25/13 8416  . gabapentin (NEURONTIN) capsule 400 mg  400 mg Oral QID Lurena Nida, NP   400 mg at 03/25/13 1204  . hydrOXYzine (ATARAX/VISTARIL) tablet 25 mg  25 mg Oral Q6H PRN Lurena Nida, NP      . loperamide (IMODIUM) capsule 2-4 mg  2-4 mg Oral PRN Lurena Nida, NP      . magnesium hydroxide (MILK OF MAGNESIA) suspension 30 mL  30 mL Oral Daily PRN Lurena Nida, NP      . methocarbamol (ROBAXIN) tablet 500 mg  500 mg Oral Q8H PRN Lurena Nida, NP   500 mg at 03/25/13 0831  . mirtazapine (REMERON) tablet 45 mg  45 mg Oral QHS Lurena Nida, NP   45 mg at 03/24/13 2303  . multivitamin with minerals tablet 1 tablet  1 tablet Oral Daily Lurena Nida, NP   1 tablet at 03/25/13 6063  . naproxen (NAPROSYN) tablet 500 mg  500 mg Oral BID PRN Lurena Nida, NP   500 mg at 03/25/13 0831  . nicotine (NICODERM CQ - dosed in mg/24 hours) patch 21 mg  21 mg Transdermal Daily Nicholaus Bloom, MD   21 mg at 03/25/13 0160  . ondansetron (ZOFRAN-ODT) disintegrating tablet 4 mg  4 mg Oral Q6H PRN Lurena Nida, NP      . QUEtiapine (SEROQUEL) tablet  200 mg  200 mg Oral QHS Lurena Nida, NP   200 mg at 03/24/13 2303  . thiamine (VITAMIN B-1) tablet 100 mg  100 mg Oral Daily Lurena Nida, NP   100 mg at 03/25/13 9924    Observation Level/Precautions:  Detox 15 minute checks  Laboratory:  ED findings reviewed and assessed  Psychotherapy:  Individual and group therapy  Medications:  See above  Consultations:  Per need  Discharge Concerns: Sobriety    Estimated LOS: 5-7 days  Other:     I certify that inpatient services furnished can reasonably be expected to improve the patient's condition.   Nanci Pina 3/7/20152:42 PM I have examined the patient and agreed with the findings of H&P and treatment plan. Have done Suicide assessment agree with plan.

## 2013-03-25 NOTE — [facility] Counselor (Signed)
Adult Comprehensive Assessment  Patient ID: [name], male   DOB: 1965/07/06, 48 y.o.   MRN: [id number]  Information Source: Information source: Patient  Current Stressors:  Educational / Learning stressors: NA Employment / Job issues: On Disability Family Relationships: strained when drinking; good when sober Financial / Lack of resources (include bankruptcy): Pt reports recent relapse has caused financial difficulties as basically things are always tight Housing / Lack of housing: NA Physical health (include injuries & life threatening diseases): Arthritis and multiple joint replacement, diabetic foot abscesses Social relationships: NA, does acknowledge need for more sober friends Substance abuse: History of alcohol and cocaine dependency and recent relapse of 3 weeks Bereavement / Loss: NA  Living/Environment/Situation:  Living Arrangements: Alone Living conditions (as described by patient or guardian): Good How long has patient lived in current situation?: 3 years What is atmosphere in current home: Comfortable  Family History:  Marital status: Long term relationship Long term relationship, how long?: 3 years, some distance currently due to relapse What types of issues is patient dealing with in the relationship?: Patient's substance abuse and financial issues Does patient have children?: No  Childhood History:  By whom was/is the patient raised?: Both parents Additional childhood history information: Good childhood, parents still together and still supportive Description of patient's relationship with caregiver when they were a child: Good Patient's description of current relationship with people who raised him/her: Remains good with both except for relapse Does patient have siblings?: Yes Number of Siblings: 1 Description of patient's current relationship with siblings: Close with sister Did patient suffer any verbal/emotional/physical/sexual abuse as a child?: No Did  patient suffer from severe childhood neglect?: No Has patient ever been sexually abused/assaulted/raped as an adolescent or adult?: No Was the patient ever a victim of a crime or a disaster?: No Witnessed domestic violence?: No Has patient been effected by domestic violence as an adult?: No  Education:  Highest grade of school patient has completed: 59 Currently a student?: No Learning disability?: No  Employment/Work Situation:   Employment situation: On disability Why is patient on disability: Multiple joint issues including replacement and arthritis How long has patient been on disability: 4 years Patient's job has been impacted by current illness: No What is the longest time patient has a held a job?: 27 years 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 Has patient ever been in the [employer]?: No Has patient ever served in Recruitment consultant?: No  Financial Resources:   Museum/gallery curator resources: Insurance claims handler Does patient have a Programmer, applications or guardian?: No  Alcohol/Substance Abuse:   What has been your use of drugs/alcohol within the last 12 months?: Daily use of alcohol 24 12 oz beers daily for 2-3 weeks; 2 grams cocaine daily 2-3 weeks Alcohol/Substance Abuse Treatment Hx: Past Tx, Inpatient;Past Tx, Outpatient;Past detox If yes, describe treatment: [facility] for detox multiple times; [facility] and outpatient therapy Has alcohol/substance abuse ever caused legal problems?: Yes (DUIs, currently has no license, needs to pay fines to get assessment, reinstatement and purchase breath monitor equipment)  Social Support System:   Patient's Community Support System: Good Describe Community Support System: Sister, parents, church community Type of faith/religion: Baptist How does patient's faith help to cope with current illness?: It helps, gives me hope  Leisure/Recreation:   Leisure and Hobbies: Winter is hard too much time indoors where as enjoy being  outdoors  Strengths/Needs:   What things does the patient do well?: Gardening, electrical fix its In what areas does patient  struggle / problems for patient: Winter, no license, boredom, peer pressure  Discharge Plan:   Does patient have access to transportation?: Yes (family) Will patient be returning to same living situation after discharge?: Yes Currently receiving community mental health services: Yes (From Whom) ([facility], sees Christee and Dr there, needs to reschedule appointments) Does patient have financial barriers related to discharge medications?: No  Summary/Recommendations:   Summary and Recommendations (to be completed by the evaluator): Patient is 48 YO single disabled caucasian male admitted with diagnosis of Alcohol Dependence, Cocaine Dependence, and Depressive Disorder NOS.  Patient would benefit from crisis stabilization, medication evaluation, therapy groups for processing thoughts/feelings/experiences, psycho ed groups for increasing coping skills, and aftercare planning   Lyla Glassing. 03/25/2013

## 2013-03-25 NOTE — Progress Notes (Signed)
 .  Psychoeducational Group Note    Date: 03/25/2013 Time:  0930  Goal Setting Purpose of Group: To be able to set a goal that is measurable and that can be accomplished in one day Participation Level:  Active  Participation Quality:  Appropriate  Affect:  Appropriate  Cognitive:  Oriented  Insight:  Improving  Engagement in Group:  Engaged  Additional Comments:  [name]

## 2013-03-25 NOTE — Progress Notes (Signed)
Adult Psychoeducational Group Note  Date:  03/25/2013 Time:  4:42 PM  Group Topic/Focus:  Healthy Communication:   The focus of this group is to discuss communication, barriers to communication, as well as healthy ways to communicate with others.  Participation Level:  Active  Participation Quality:  Appropriate, Attentive and Sharing  Affect:  Appropriate  Cognitive:  Appropriate and Oriented  Insight: Appropriate  Engagement in Group:  Engaged  Modes of Intervention:  Activity, Discussion and Socialization Elisha Headland 03/25/2013, 4:42 PM

## 2013-03-25 NOTE — Progress Notes (Signed)
Pt has slept a good portion of this evening though did get up for night time meds. Complaining of chronic pain of a 5/10 and is requesting a prn. Denying withdrawal other than anxiety therefore CIWA scored a "1". VSS. Pt given naproxen for pain, scheduled meds per orders. Support offered. Denies SI/HI/AVH and remains safe. Jamie Kato

## 2013-03-25 NOTE — Tx Team (Signed)
Initial Interdisciplinary Treatment Plan  PATIENT STRENGTHS: (choose at least two) Ability for insight Active sense of humor Average or above average intelligence Capable of independent living Motivation for treatment/growth Religious Affiliation Supportive family/friends  PATIENT STRESSORS: Health problems Substance abuse   PROBLEM LIST: Problem List/Patient Goals Date to be addressed Date deferred Reason deferred Estimated date of resolution  Substance dependence 03/24/2013     anxiety 03/24/2013                                                DISCHARGE CRITERIA:  Ability to meet basic life and health needs Motivation to continue treatment in a less acute level of care Withdrawal symptoms are absent or subacute and managed without 24-hour nursing intervention  PRELIMINARY DISCHARGE PLAN: Attend 12-step recovery group Outpatient therapy Return to previous living arrangement  PATIENT/FAMIILY INVOLVEMENT: This treatment plan has been presented to and reviewed with the patient, [name], and/or family member, The patient and family have been given the opportunity to ask questions and make suggestions.  JEHU-APPIAH, Keyonda Bickle K 03/25/2013, 12:04 AM

## 2013-03-25 NOTE — Progress Notes (Signed)
[name] is doing fairly well. HE smiles frequently, he is pleasantly cooperative and  He is attending all of his groups as well. HE is compliant with his medication regimen also.    A He completes his morning self inventory and on it he writes he denies SI within the past 24 hrs, he rates his depression and hopelessness "6/6" and writes his dc plan is " to attend church and go to more social groups".   R Safety is  In place and poc maintained

## 2013-03-25 NOTE — Progress Notes (Signed)
Patient ID: [name], male   DOB: 07/22/1965, 48 y.o.   MRN: [id number] Admission note: D:Patient is a  Voluntary admission in no acute distress for alcohol and cocaine detox. Pt report sober for 4 months. Pt stated a week before his 50 th birthday his friend came from prison and they started using cocaine and drinking alcohol. Pt reports drinking 12 oz/24 pack a day for the past couple weeks. Pt is motivated to get help stating "I'm too old to keeping doing to myself". 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 Pt stated he is boarderline diabetic and not currently on any medications. Pt has two ulcer on the bottom of his feet and requested to have his sneakers and lace on the unit. Pt is slow and unsteady on his feet. Pt denies SI/HI/AVH and pain.  A: Pt admitted to unit per protocol, skin assessment and belonging search done. No skin issues noted. Consent signed by pt. Pt educated on therapeutic milieu rules. Pt was introduced to milieu by nursing staff. Fall risk safety plan explained to the patient. Meal offered and pt accepted. 15 minutes checks started for safety.  R: Pt was receptive to education. Writer offered support.

## 2013-03-25 NOTE — Progress Notes (Signed)
Psychoeducational Group Note  Date:  03/25/2013 Time:  2100 Group Topic/Focus:  wrap up group  Participation Level: Did Not Attend  Participation Quality:  Not Applicable  Affect:  Not Applicable  Cognitive:  Not Applicable  Insight:  Not Applicable  Engagement in Group: Not Applicable  Additional Comments:  Pt remained in bed during group time.   Jacques Navy 03/25/2013, 10:03 PM

## 2013-03-26 LAB — GLUCOSE, CAPILLARY
Glucose-Capillary: 127 mg/dL — ABNORMAL HIGH (ref 70–99)
Glucose-Capillary: 139 mg/dL — ABNORMAL HIGH (ref 70–99)

## 2013-03-26 MED ORDER — FLEET ENEMA 7-19 GM/118ML RE ENEM
1.0000 | ENEMA | Freq: Once | RECTAL | Status: DC
  Filled 2013-03-26: qty 1

## 2013-03-26 MED ORDER — MAGNESIUM CITRATE PO SOLN
1.0000 | Freq: Once | ORAL | Status: AC
  Administered 2013-03-26: 1 via ORAL

## 2013-03-26 NOTE — Progress Notes (Signed)
Psychoeducational Group Note  Date:  03/26/2013 Time:  1315 Group Topic/Focus:  Making Healthy Choices:   The focus of this group is to help patients identify negative/unhealthy choices they were using prior to admission and identify positive/healthier coping strategies to replace them upon discharge.  Participation Level:  Did not attend Paulino Rily 03/26/2013

## 2013-03-26 NOTE — Progress Notes (Signed)
[name] Progress Note  03/26/2013 2:17 PM [name]  MRN:  [id number] Subjective:  [name] is a 48 year old male who presents for alcohol and substance abuse. He states his detox program is going well, although he is experiencing some withdrawal symptoms. These include abdominal pain, nausea, and anxiety and agitation. He reports medication compliance, and is actively participating in group. Denies SI/HI/AVH.  Diagnosis:   DSM5: Schizophrenia Disorders:   Obsessive-Compulsive Disorders:   Trauma-Stressor Disorders:  Acute Stress Disorder (308.3) Substance/Addictive Disorders:  Alcohol Intoxication with Use Disorder - Severe (F10.229) Depressive Disorders:  Major Depressive Disorder - Severe (296.23) Total Time spent with patient: 30 minutes  Axis I: Alcohol Abuse, Generalized Anxiety Disorder, Major Depression, Recurrent severe and Substance Abuse Axis II: Deferred Axis IV: economic problems, housing problems, other psychosocial or environmental problems, problems related to social environment, problems with access to health care services and problems with primary support group Axis V: 41-50 serious symptoms  ADL's:  Intact  Sleep: Good  Appetite:  Good  Suicidal Ideation:  Plan:  Denies Intent:  Denies Means:  Denies Homicidal Ideation:  Plan:  denies Intent:  Denies Means:  Denies AEB (as evidenced by):  Psychiatric Specialty Exam: Physical Exam  Constitutional: He is oriented to person, place, and time. He appears well-developed.  Eyes: Pupils are equal, round, and reactive to light.  Neck: Normal range of motion.  Cardiovascular: Normal rate.   Respiratory: Effort normal.  GI: Soft.  Musculoskeletal: Normal range of motion.  Neurological: He is alert and oriented to person, place, and time.  Skin: Skin is warm and dry.    Review of Systems  Gastrointestinal: Positive for heartburn, nausea and abdominal pain.  Psychiatric/Behavioral: Positive for depression and  substance abuse. The patient is nervous/anxious.   All other systems reviewed and are negative.    Blood pressure 122/74, pulse 69, temperature 97.7 F (36.5 C), temperature source Oral, resp. rate 20, height 6\' 2"  (1.88 m), weight 92.987 kg (205 lb), SpO2 98.00%.Body mass index is 26.31 kg/(m^2).  General Appearance: Fairly Groomed  Engineer, water::  Good  Speech:  Clear and Coherent and Normal Rate  Volume:  Normal  Mood:  Anxious, Depressed and Irritable  Affect:  Depressed and Flat  Thought Process:  Goal Directed and Intact  Orientation:  Full (Time, Place, and Person)  Thought Content:  WDL  Suicidal Thoughts:  No  Homicidal Thoughts:  No  Memory:  Immediate;   Good Recent;   Good Remote;   Good  Judgement:  Good  Insight:  Fair  Psychomotor Activity:  Normal  Concentration:  Good  Recall:  Good  Fund of Knowledge:Good  Language: Good  Akathisia:  No  Handed:  Right  AIMS (if indicated):     Assets:  Communication Skills Desire for Improvement Social Support Talents/Skills  Sleep:  Number of Hours: 4   Musculoskeletal: Strength & Muscle Tone: within normal limits Gait & Station: normal Patient leans: Right and N/A  Current Medications: Current Facility-Administered Medications  Medication Dose Route Frequency Provider Last Rate Last Dose  . acetaminophen (TYLENOL) tablet 650 mg  650 mg Oral Q6H PRN Lurena Nida, NP      . alum & mag hydroxide-simeth (MAALOX/MYLANTA) 200-200-20 MG/5ML suspension 30 mL  30 mL Oral Q4H PRN Lurena Nida, NP      . chlordiazePOXIDE (LIBRIUM) capsule 25 mg  25 mg Oral Q6H PRN Lurena Nida, NP      . chlordiazePOXIDE (LIBRIUM)  capsule 25 mg  25 mg Oral TID Lurena Nida, NP   25 mg at 03/26/13 1151   Followed by  . [START ON 03/27/2013] chlordiazePOXIDE (LIBRIUM) capsule 25 mg  25 mg Oral BH-qamhs Lurena Nida, NP   25 mg at 03/26/13 0746   Followed by  . [START ON 03/29/2013] chlordiazePOXIDE (LIBRIUM) capsule 25 mg  25 mg Oral Daily  Lurena Nida, NP      . cloNIDine (CATAPRES) tablet 0.1 mg  0.1 mg Oral QID Lurena Nida, NP   0.1 mg at 03/26/13 1150   Followed by  . [START ON 03/27/2013] cloNIDine (CATAPRES) tablet 0.1 mg  0.1 mg Oral BH-qamhs Lurena Nida, NP   0.1 mg at 03/26/13 0746   Followed by  . [START ON 03/30/2013] cloNIDine (CATAPRES) tablet 0.1 mg  0.1 mg Oral QAC breakfast Lurena Nida, NP      . dicyclomine (BENTYL) tablet 20 mg  20 mg Oral Q6H PRN Lurena Nida, NP      . escitalopram (LEXAPRO) tablet 20 mg  20 mg Oral q morning - 10a Lurena Nida, NP   20 mg at 03/25/13 2706  . gabapentin (NEURONTIN) capsule 400 mg  400 mg Oral QID Lurena Nida, NP   400 mg at 03/26/13 1151  . hydrOXYzine (ATARAX/VISTARIL) tablet 25 mg  25 mg Oral Q6H PRN Lurena Nida, NP   25 mg at 03/26/13 0323  . loperamide (IMODIUM) capsule 2-4 mg  2-4 mg Oral PRN Lurena Nida, NP      . magnesium hydroxide (MILK OF MAGNESIA) suspension 30 mL  30 mL Oral Daily PRN Lurena Nida, NP   30 mL at 03/26/13 1000  . methocarbamol (ROBAXIN) tablet 500 mg  500 mg Oral Q8H PRN Lurena Nida, NP   500 mg at 03/26/13 1155  . mirtazapine (REMERON) tablet 45 mg  45 mg Oral QHS Lurena Nida, NP   45 mg at 03/25/13 2111  . multivitamin with minerals tablet 1 tablet  1 tablet Oral Daily Lurena Nida, NP   1 tablet at 03/26/13 0746  . naproxen (NAPROSYN) tablet 500 mg  500 mg Oral BID PRN Lurena Nida, NP   500 mg at 03/26/13 0323  . nicotine (NICODERM CQ - dosed in mg/24 hours) patch 21 mg  21 mg Transdermal Daily Nicholaus Bloom, Palmer   21 mg at 03/25/13 2376  . ondansetron (ZOFRAN-ODT) disintegrating tablet 4 mg  4 mg Oral Q6H PRN Lurena Nida, NP      . QUEtiapine (SEROQUEL) tablet 200 mg  200 mg Oral QHS Lurena Nida, NP   200 mg at 03/25/13 2111  . thiamine (VITAMIN B-1) tablet 100 mg  100 mg Oral Daily Lurena Nida, NP   100 mg at 03/26/13 0745    Lab Results:  Results for orders placed during the hospital encounter of 03/24/13 (from the  past 48 hour(s))  GLUCOSE, CAPILLARY     Status: Abnormal   Collection Time    03/25/13  6:07 AM      Result Value Ref Range   Glucose-Capillary 117 (*) 70 - 99 mg/dL  GLUCOSE, CAPILLARY     Status: Abnormal   Collection Time    03/25/13  9:14 PM      Result Value Ref Range   Glucose-Capillary 163 (*) 70 - 99 mg/dL  GLUCOSE, CAPILLARY     Status: Abnormal  Collection Time    03/26/13  4:32 AM      Result Value Ref Range   Glucose-Capillary 127 (*) 70 - 99 mg/dL   Comment 1 Notify RN     Comment 2 Documented in Chart      Physical Findings: AIMS: Facial and Oral Movements Muscles of Facial Expression: None, normal Lips and Perioral Area: None, normal Jaw: None, normal Tongue: None, normal,Extremity Movements Upper (arms, wrists, hands, fingers): None, normal Lower (legs, knees, ankles, toes): None, normal, Trunk Movements Neck, shoulders, hips: None, normal, Overall Severity Severity of abnormal movements (highest score from questions above): None, normal Incapacitation due to abnormal movements: None, normal Patient's awareness of abnormal movements (rate only patient's report): No Awareness, Dental Status Current problems with teeth and/or dentures?: No Does patient usually wear dentures?: No  CIWA:  CIWA-Ar Total: 4 COWS:     Treatment Plan Summary: Daily contact with patient to assess and evaluate symptoms and progress in treatment Medication management  Plan:1 Admit for crisis management and stabilization.  2. Medication management to reduce symptoms to baseline and improved the patient's overall level of functioning. Closely monitor the side effects, efficacy and therapeutic response of medication.  3. Treat health problem as indicated.  4. Developed treatment plan to decrease the risk of relapse upon discharge and to reduce the need for readmission.  5. Psychosocial education regarding relapse prevention in self-care.  6. Healthcare followup as needed for medical  problems and called consults as indicated.  7. Increase collateral information.  8. Restart home medication where appropriate  9. Encouraged to participate and verbalize into group milieu therapy.    Medical Decision Making Problem Points:  Established problem, worsening (2), Review of last therapy session (1) and Review of psycho-social stressors (1) Data Points:  Review or order clinical lab tests (1) Review or order medicine tests (1) Review of medication regiment & side effects (2) Review of new medications or change in dosage (2)  I certify that inpatient services furnished can reasonably be expected to improve the patient's condition.   Priscille Loveless S FNP-BC 03/26/2013, 2:17 PM

## 2013-03-26 NOTE — Progress Notes (Signed)
Psychoeducational Group Note  Date: 03/26/2013 Time:  0900  Group Topic/Focus:  Gratefulness:  The focus of this group is to help patients identify what two things they are most grateful for in their lives. What helps ground them and to center them on their work to their recovery.  Participation Level:  Active  Participation Quality:  Appropriate  Affect:  Appropriate  Cognitive:  Oriented  Insight:  Improving  Engagement in Group:  Engaged  Additional Comments:    Paulino Rily

## 2013-03-26 NOTE — Progress Notes (Addendum)
[name] Diondre cont to handle his withdrawal with minimal c/o and requests. HE is pleasant and cooperative, seen interacting with his peers and the staff appropriately.   A HE completes his morning inventory and on it he writes he denies SI within the past 24 hrs, he rates his depression and hopelessness "3/3" and says his DC plans are to " be around sober friends and families and stay in church".   R Safety is in place and poc cont.

## 2013-03-26 NOTE — Progress Notes (Signed)
Late entry on 03-26-2013 for 03-25-2013   Psychoeducational Group Note  Date: 03/26/2013 Time:  1015  Group Topic/Focus:  Identifying Needs:   The focus of this group is to help patients identify their personal needs that have been historically problematic and identify healthy behaviors to address their needs.  Participation Level:  Active  Participation Quality:  Appropriate  Affect:  Appropriate  Cognitive:  Oriented  Insight:  Improving  Engagement in Group:  Engaged  Additional Comments:  Participated fully in the group  Jibril Mcminn A 

## 2013-03-26 NOTE — Progress Notes (Signed)
Pt reports having a good evening with minimal withdrawal. CIWA is a "2" due to anxiety. He denies pain at present but requests a librium prn to help with the anxiety. Pt also complaining of constipation and MOM was ineffective this morning. NP contacted and orders received. Supported, encouraged. Medicated per orders, librium given and on reassess, pt is less anxious. No BM as of this time. Pt safe resting in bed. No SI/HI/AVH and he remains safe. [name]

## 2013-03-26 NOTE — Progress Notes (Signed)
Adult Psychoeducational Group Note  Date:  03/26/2013 Time:  3:54 PM  Group Topic/Focus:  Healthy Coping Skills  Participation Level:  Active  Participation Quality:  Appropriate and Attentive  Affect:  Appropriate  Cognitive:  Appropriate  Insight: Appropriate  Engagement in Group:  Engaged  Modes of Intervention:  Activity, Discussion and [place], [place] 03/26/2013, 3:54 PM

## 2013-03-26 NOTE — BHH Group Notes (Signed)
[place] Group Notes: (Clinical Social Work)   03/26/2013 10:00-11:00AM   Summary of Progress/Problems: The main focus of today's process group was to identify the patient's current support system and decide on other supports that can be put in place. The picture on workbook was used to discuss why additional supports are needed. An emphasis was placed on using counselor, doctor, therapy groups, 12-step groups, and problem-specific support groups to expand supports. There was also an extensive discussion about what constitutes a healthy support versus an unhealthy support. The patient expressed full comprehension of the concepts presented, and agreed that there is a need to add more supports. The patient stated the current supports in place are his son, sister, and parents.  The patient did not speak any more during group, even left for awhile, was drowsy at times, but appeared to be making an attempt to stay engaged.  Type of Therapy: Process Group with Motivational Interviewing   Participation Level: Minimal  Participation Quality:  Attentive and Drowsy  Affect: Blunted  Cognitive: Appropriate   Insight: Developing/Improving   Engagement in Therapy: Improving  Modes of Intervention: Education, Support and Processing, Activity   Selmer Dominion, LCSW 12:53 PM

## 2013-03-27 DIAGNOSIS — F1994 Other psychoactive substance use, unspecified with psychoactive substance-induced mood disorder: Secondary | ICD-10-CM

## 2013-03-27 LAB — GLUCOSE, CAPILLARY: GLUCOSE-CAPILLARY: 128 mg/dL — AB (ref 70–99)

## 2013-03-27 MED ORDER — FLEET ENEMA 7-19 GM/118ML RE ENEM
1.0000 | ENEMA | Freq: Once | RECTAL | Status: AC | PRN
  Filled 2013-03-27: qty 1

## 2013-03-27 MED ORDER — POLYETHYLENE GLYCOL 3350 17 G PO PACK
17.0000 g | PACK | Freq: Two times a day (BID) | ORAL | Status: DC
  Administered 2013-03-27 – 2013-03-28 (×2): 17 g via ORAL
  Filled 2013-03-27 (×8): qty 1

## 2013-03-27 MED ORDER — BISACODYL 10 MG RE SUPP
10.0000 mg | Freq: Once | RECTAL | Status: AC
  Administered 2013-03-27: 10 mg via RECTAL
  Filled 2013-03-27: qty 1

## 2013-03-27 MED ORDER — MINERAL OIL RE ENEM
1.0000 | ENEMA | Freq: Once | RECTAL | Status: DC
  Filled 2013-03-27: qty 1

## 2013-03-27 MED ORDER — METHOCARBAMOL 500 MG PO TABS
500.0000 mg | ORAL_TABLET | Freq: Three times a day (TID) | ORAL | Status: DC
  Administered 2013-03-27 – 2013-03-29 (×6): 500 mg via ORAL
  Filled 2013-03-27 (×11): qty 1

## 2013-03-27 MED ORDER — NABUMETONE 500 MG PO TABS
500.0000 mg | ORAL_TABLET | Freq: Two times a day (BID) | ORAL | Status: DC
  Administered 2013-03-27 – 2013-03-29 (×4): 500 mg via ORAL
  Filled 2013-03-27 (×8): qty 1

## 2013-03-27 MED ORDER — DOCUSATE SODIUM 100 MG PO CAPS
100.0000 mg | ORAL_CAPSULE | Freq: Two times a day (BID) | ORAL | Status: DC
  Administered 2013-03-27 – 2013-03-29 (×3): 100 mg via ORAL
  Filled 2013-03-27 (×8): qty 1

## 2013-03-27 NOTE — BHH Group Notes (Signed)
[facility] LCSW Aftercare Discharge Planning Group Note   03/27/2013 12:09 PM  Participation Quality:  Appropriate   Mood/Affect:  Appropriate  Depression Rating:  2  Anxiety Rating:  5  Thoughts of Suicide:  No Will you contract for safety?   NA  Current AVH:  No  Plan for Discharge/Comments:  Pt reports that he relapsed 3 weeks ago after a friend got out of jail and talked him into using. Pt plans to follow up at [facility] in [facility] for med management and therapy. Appts needed. Pt reports mild withdrawals-tremors today and is hoping for d/c by Wednesday.   Transportation Means: parent  Supports: parents/church friends   Proofreader, Research officer, trade union

## 2013-03-27 NOTE — BHH Group Notes (Signed)
Adult Psychoeducational Group Note  Date:  03/27/2013 Time:  11:27 AM  Group Topic/Focus:  Dimensions of Wellness:   The focus of this group is to introduce the topic of wellness and discuss the role each dimension of wellness plays in total health.  Participation Level:  Active  Participation Quality:  Sharing  Affect:  Appropriate  Cognitive:  Appropriate  Insight: Good  Engagement in Group:  Engaged  Modes of Intervention:  Discussion, Education, Exploration and Support  Additional Comments:    Rahshawn Remo P 03/27/2013, 11:27 AM

## 2013-03-27 NOTE — Progress Notes (Signed)
Patient did attend the evening speaker AA meeting.  

## 2013-03-27 NOTE — Progress Notes (Signed)
Patient ID: [name], male   DOB: 1965/03/30, 48 y.o.   MRN: [id number] D: Pt is awake and active on the unit this AM. Pt denies SI/HI and A/V hallucinations. Pt mood is depressed and his affect is anxious. Pt is c/o constipation. Writer provided suppository, and pt reports that it was effective. Pt is participating in the milieu and is cooperative with staff.    A: Encouraged pt to discuss feelings with staff and administered medication per MD orders. Writer also encouraged pt to participate in groups.  R: Writer will continue to monitor. 15 minute checks are ongoing for safety.

## 2013-03-27 NOTE — BHH Suicide Risk Assessment (Signed)
[place] INPATIENT: Family/Significant Other Suicide Prevention Education  Suicide Prevention Education:  Education Completed; No one has been identified by the patient as the family member/significant other with whom the patient will be residing, and identified as the person(s) who will aid the patient in the event of a mental health crisis (suicidal ideations/suicide attempt).   Pt did not c/o SI at admission, nor have they endorsed SI during their stay here. SPE not required. SPI pamphlet provided to pt and he was encouraged to share information with support network, ask questions, and talk about any concerns relating to SPE.  [place], [place] 03/27/2013 1:03 PM

## 2013-03-27 NOTE — Progress Notes (Signed)
[facility] MD Progress Note  03/27/2013 5:33 PM Issaih Palmer  MRN:  [id number] Subjective:  Santiago expresses regret for having relapsed. States that the friend who came by is not going to be coming by anymore. He states that he cant continue to do what he is doing. States that the withdrawal gets worst  every time Diagnosis:   DSM5: Schizophrenia Disorders:  None Obsessive-Compulsive Disorders:  None Trauma-Stressor Disorders:  none Substance/Addictive Disorders:  Alcohol Related Disorder - Severe (303.90), cocaine related disorder Depressive Disorders:  Major Depressive Disorder - Moderate (296.22) Total Time spent with patient: 30 minutes  Axis I: Generalized Anxiety Disorder and Substance Induced Mood Disorder  ADL's:  Intact  Sleep: Fair  Appetite:  Fair  Suicidal Ideation:  Plan:  denies Intent:  denies Means:  denies Homicidal Ideation:  Plan:  denies Intent:  denies Means:  denies AEB (as evidenced by):  Psychiatric Specialty Exam: Physical Exam  Review of Systems  HENT: Negative.   Eyes: Negative.   Respiratory: Negative.   Cardiovascular: Negative.   Gastrointestinal: Negative.   Genitourinary: Negative.   Musculoskeletal: Positive for back pain and joint pain.  Skin: Negative.   Neurological: Positive for weakness.  Endo/Heme/Allergies: Negative.   Psychiatric/Behavioral: Positive for substance abuse. The patient is nervous/anxious and has insomnia.     Blood pressure 115/71, pulse 93, temperature 97.9 F (36.6 C), temperature source Oral, resp. rate 24, height 6\' 2"  (1.88 m), weight 92.987 kg (205 lb), SpO2 98.00%.Body mass index is 26.31 kg/(m^2).  General Appearance: Fairly Groomed  Engineer, water::  Fair  Speech:  Clear and Coherent  Volume:  Decreased  Mood:  Anxious  Affect:  Appropriate  Thought Process:  Coherent and Goal Directed  Orientation:  Full (Time, Place, and Person)  Thought Content:  symptoms, worries concerns  Suicidal Thoughts:  No  Homicidal  Thoughts:  No  Memory:  Immediate;   Fair Recent;   Fair Remote;   Fair  Judgement:  Fair  Insight:  Shallow  Psychomotor Activity:  Restlessness  Concentration:  Fair  Recall:  [employer] of Knowledge:NA  Language: Fair  Akathisia:  No  Handed:    AIMS (if indicated):     Assets:  Desire for Improvement  Sleep:  Number of Hours: 5.25   Musculoskeletal: Strength & Muscle Tone: within normal limits Gait & Station: normal Patient leans: N/A  Current Medications: Current Facility-Administered Medications  Medication Dose Route Frequency Provider Last Rate Last Dose  . acetaminophen (TYLENOL) tablet 650 mg  650 mg Oral Q6H PRN Lurena Nida, NP      . alum & mag hydroxide-simeth (MAALOX/MYLANTA) 200-200-20 MG/5ML suspension 30 mL  30 mL Oral Q4H PRN Lurena Nida, NP      . chlordiazePOXIDE (LIBRIUM) capsule 25 mg  25 mg Oral Q6H PRN Lurena Nida, NP   25 mg at 03/26/13 2205  . chlordiazePOXIDE (LIBRIUM) capsule 25 mg  25 mg Oral BH-qamhs Lurena Nida, NP   25 mg at 03/26/13 0746   Followed by  . [START ON 03/29/2013] chlordiazePOXIDE (LIBRIUM) capsule 25 mg  25 mg Oral Daily Lurena Nida, NP      . cloNIDine (CATAPRES) tablet 0.1 mg  0.1 mg Oral BH-qamhs Lurena Nida, NP   0.1 mg at 03/26/13 0746   Followed by  . [START ON 03/30/2013] cloNIDine (CATAPRES) tablet 0.1 mg  0.1 mg Oral QAC breakfast Lurena Nida, NP      . dicyclomine (  BENTYL) tablet 20 mg  20 mg Oral Q6H PRN Lurena Nida, NP      . docusate sodium (COLACE) capsule 100 mg  100 mg Oral BID Nicholaus Bloom, MD   100 mg at 03/27/13 1634  . escitalopram (LEXAPRO) tablet 20 mg  20 mg Oral q morning - 10a Lurena Nida, NP   20 mg at 03/27/13 [id number]  . gabapentin (NEURONTIN) capsule 400 mg  400 mg Oral QID Lurena Nida, NP   400 mg at 03/27/13 1634  . hydrOXYzine (ATARAX/VISTARIL) tablet 25 mg  25 mg Oral Q6H PRN Lurena Nida, NP   25 mg at 03/26/13 0323  . loperamide (IMODIUM) capsule 2-4 mg  2-4 mg Oral PRN Lurena Nida, NP      . magnesium hydroxide (MILK OF MAGNESIA) suspension 30 mL  30 mL Oral Daily PRN Lurena Nida, NP   30 mL at 03/27/13 1046  . methocarbamol (ROBAXIN) tablet 500 mg  500 mg Oral TID Nicholaus Bloom, MD   500 mg at 03/27/13 1634  . mirtazapine (REMERON) tablet 45 mg  45 mg Oral QHS Lurena Nida, NP   45 mg at 03/26/13 2203  . multivitamin with minerals tablet 1 tablet  1 tablet Oral Daily Lurena Nida, NP   1 tablet at 03/27/13 [id number]  . nabumetone (RELAFEN) tablet 500 mg  500 mg Oral BID Nicholaus Bloom, MD   500 mg at 03/27/13 1634  . naproxen (NAPROSYN) tablet 500 mg  500 mg Oral BID PRN Lurena Nida, NP   500 mg at 03/27/13 [id number]  . nicotine (NICODERM CQ - dosed in mg/24 hours) patch 21 mg  21 mg Transdermal Daily Nicholaus Bloom, MD   21 mg at 03/27/13 0830  . ondansetron (ZOFRAN-ODT) disintegrating tablet 4 mg  4 mg Oral Q6H PRN Lurena Nida, NP      . polyethylene glycol (MIRALAX / GLYCOLAX) packet 17 g  17 g Oral BID Encarnacion Slates, NP   17 g at 03/27/13 1634  . QUEtiapine (SEROQUEL) tablet 200 mg  200 mg Oral QHS Lurena Nida, NP   200 mg at 03/26/13 2203  . sodium phosphate (FLEET) 7-19 GM/118ML enema 1 enema  1 enema Rectal Once Lurena Nida, NP      . sodium phosphate (FLEET) 7-19 GM/118ML enema 1 enema  1 enema Rectal Once PRN Nicholaus Bloom, MD      . thiamine (VITAMIN B-1) tablet 100 mg  100 mg Oral Daily Lurena Nida, NP   100 mg at 03/27/13 [id number]    Lab Results:  Results for orders placed during the hospital encounter of 03/24/13 (from the past 48 hour(s))  GLUCOSE, CAPILLARY     Status: Abnormal   Collection Time    03/25/13  9:14 PM      Result Value Ref Range   Glucose-Capillary 163 (*) 70 - 99 mg/dL  GLUCOSE, CAPILLARY     Status: Abnormal   Collection Time    03/26/13  4:32 AM      Result Value Ref Range   Glucose-Capillary 127 (*) 70 - 99 mg/dL   Comment 1 Notify RN     Comment 2 Documented in Chart    GLUCOSE, CAPILLARY     Status: Abnormal   Collection  Time    03/26/13  9:27 PM      Result Value Ref Range   Glucose-Capillary 139 (*)  70 - 99 mg/dL   Comment 1 Notify RN     Comment 2 Documented in Chart    GLUCOSE, CAPILLARY     Status: Abnormal   Collection Time    03/27/13  5:46 AM      Result Value Ref Range   Glucose-Capillary 128 (*) 70 - 99 mg/dL   Comment 1 Notify RN     Comment 2 Documented in Chart      Physical Findings: AIMS: Facial and Oral Movements Muscles of Facial Expression: None, normal Lips and Perioral Area: None, normal Jaw: None, normal Tongue: None, normal,Extremity Movements Upper (arms, wrists, hands, fingers): None, normal Lower (legs, knees, ankles, toes): None, normal, Trunk Movements Neck, shoulders, hips: None, normal, Overall Severity Severity of abnormal movements (highest score from questions above): None, normal Incapacitation due to abnormal movements: None, normal Patient's awareness of abnormal movements (rate only patient's report): No Awareness, Dental Status Current problems with teeth and/or dentures?: No Does patient usually wear dentures?: No  CIWA:  CIWA-Ar Total: 2 COWS:     Treatment Plan Summary: Daily contact with patient to assess and evaluate symptoms and progress in treatment Medication management  Plan: Supportive approach/coping skills/relape prevention           Librium Detox/reasess and address the co morbidities            States he continues to be constipated: will use Miralax/Colace/Dulcolax supp  Medical Decision Making Problem Points:  Review of psycho-social stressors (1) Data Points:  Review of medication regiment & side effects (2) Review of new medications or change in dosage (2)  I certify that inpatient services furnished can reasonably be expected to improve the patient's condition.   Nevaeh Korte A 03/27/2013, 5:33 PM

## 2013-03-27 NOTE — Progress Notes (Signed)
Patient ID: [name], male   DOB: 09/11/1965, 48 y.o.   MRN: [id number] PER STATE REGULATIONS 482.30  THIS CHART WAS REVIEWED FOR MEDICAL NECESSITY WITH RESPECT TO THE PATIENT'S ADMISSION/ DURATION OF STAY.  NEXT REVIEW DATE: 03/28/2013  Chauncy Lean, RN, BSN CASE MANAGER

## 2013-03-27 NOTE — Tx Team (Signed)
Interdisciplinary Treatment Plan Update (Adult)  Date: 03/27/2013   Time Reviewed: 12:11 PM  Progress in Treatment:  Attending groups: Yes  Participating in groups:  Yes Taking medication as prescribed: Yes  Tolerating medication: Yes  Family/Significant othe contact made: No. SPE not required for this pt.  Patient understands diagnosis: Yes, AEB seeking treatment for ETOH detox, cocaine abuse, and mood stabilization.  Discussing patient identified problems/goals with staff: Yes  Medical problems stabilized or resolved: Yes  Denies suicidal/homicidal ideation: Yes during admission, group, and self report.  Patient has not harmed self or Others: Yes  New problem(s) identified:  Discharge Plan or Barriers: Pt plans to return home and continue followup with [facility] for med management-Dr. Brynda Greathouse and therapy-Christi. Appts needed. CSW assessing.  Additional comments:[name] is a 48 year old male who presented to [facility] ER for detox from alcohol and cocaine. Pt was transported to [facility] via private vehicle driven by his parents. His plan was to seek help and come here to behavioral health for help. He reports running out of money,and couldn't get any more beer or nothing and he started getting sick (shaking sweating, vomiting). His last drink was Friday morning at 9am, 12pk of 12 ounce beers. He reports a history of drinking for the last 2.5 weeks, he is unable to contribute this to anything. He states a friend came over and he started drinking. "Once I get started drinking I cant stop, it is the best thing in the world". Previous hospital admission was 03/2012 and 11/2012 for alcohol detox as well. He states he just started doing cocaine about 2.5 weeks, and before that he was sober for about 3 months. Alcohol makes me feel like I don't have a care in the world, bills made it worst, but then spending it all on drugs and alcohol doesn't help.  Reason for Continuation of Hospitalization: Librium  taper-withdrawals Mood stabilization Medica ton management  Estimated length of stay: 2-3 days (likely d/c WED) For review of initial/current patient goals, please see plan of care.  Attendees:  Patient:    Family:    Physician: Carlton Adam MD 03/27/2013 12:11 PM   Nursing: Butch Penny RN  03/27/2013 12:11 PM   Clinical Social Worker [place], [place]  03/27/2013 12:11 PM   Other: Lelon Frohlich RN  03/27/2013 12:11 PM   Other: Hardie Pulley. PA 03/27/2013 12:11 PM   Other: Gerline Legacy Nurse CM  03/27/2013 12:11 PM   Other:    Scribe for Treatment Team:  [place] LCSWA 03/27/2013 12:11 PM

## 2013-03-27 NOTE — Progress Notes (Signed)
Adult Psychoeducational Group Note  Date:  03/27/2013 Time:  10:57 PM  Group Topic/Focus:  Goals Group:   The focus of this group is to help patients establish daily goals to achieve during treatment and discuss how the patient can incorporate goal setting into their daily lives to aide in recovery.  Participation Level:  Active  Participation Quality:  Appropriate  Affect:  Appropriate  Cognitive:  Appropriate  Insight: Appropriate  Engagement in Group:  Engaged  Modes of Intervention:  Discussion  Additional Comments: Pt stated that he is excited about DC tomorrow and plans to use resources that are put in place.  Alexis Goodell R 03/27/2013, 10:57 PM

## 2013-03-27 NOTE — BHH Group Notes (Signed)
[place] LCSW Group Therapy  03/27/2013 2:42 PM  Type of Therapy:  Group Therapy  Participation Level:  Active  Participation Quality:  Attentive  Affect:  Appropriate  Cognitive:  Alert and Oriented  Insight:  Engaged  Engagement in Therapy:  Engaged  Modes of Intervention:  Confrontation, Discussion, Education, Exploration, Problem-solving, Rapport Building, Socialization and Support  Summary of Progress/Problems: Today's Topic: Overcoming Obstacles. Pt identified obstacles faced currently and processed barriers involved in overcoming these obstacles. Pt identified steps necessary for overcoming these obstacles and explored motivation (internal and external) for facing these difficulties head on. Pt further identified one area of concern in their lives and chose a skill of focus pulled from their "toolbox." Jeniel was attentive and engaged throughout today's therapy group. He shared that "staying away from the wrong people" will be his biggest obstacle. Hayze shared that his latest relapse occurred after reconnecting with a friend that was released from prison and immediatly starting drinking with him. Djuan stated that he took steps to let this person know he is in recovery and to respect this by not coming around or contacting him. Dearies shows progress in the group setting and improving insight AEB his ability to process how seeing his therapist and attending AA/support groups will help him maintain sobriety and continue being around people that are in recovery.    Smart, Mildreth Reek LCSWA  03/27/2013, 2:42 PM

## 2013-03-28 LAB — GLUCOSE, CAPILLARY
GLUCOSE-CAPILLARY: 220 mg/dL — AB (ref 70–99)
GLUCOSE-CAPILLARY: 170 mg/dL — AB (ref 70–99)

## 2013-03-28 NOTE — Progress Notes (Signed)
Patient ID: [name], male   DOB: 01-19-1966, 48 y.o.   MRN: [id number] PER STATE REGULATIONS 482.30  THIS CHART WAS REVIEWED FOR MEDICAL NECESSITY WITH RESPECT TO THE PATIENT'S ADMISSION/ DURATION OF STAY.  NEXT REVIEW DATE: 04/01/2013  Chauncy Lean, RN, BSN CASE MANAGER

## 2013-03-28 NOTE — BHH Group Notes (Signed)
[place] LCSW Group Therapy  03/28/2013 2:34 PM  Type of Therapy:  Group Therapy  Participation Level:  Active  Participation Quality:  Attentive  Affect:  Appropriate  Cognitive:  Alert and Oriented  Insight:  Engaged  Engagement in Therapy:  Engaged  Modes of Intervention:  Discussion, Education, Exploration, Problem-solving, Rapport Building, Socialization and Support  Summary of Progress/Problems: [facility] Speaker came to talk about his personal journey with substance abuse and addiction. The pt processed ways by which to relate to the speaker. [place] speaker provided handouts and educational information pertaining to groups and services offered by the [facility]. Jurrell was attentive and engaged throughout today's therapy group. He actively listened as the speaker shared his personal story and reviewed various services/groups offered by this agency. Brevin continues to show progress in the group setting and improving insight AEB his ability to process how these groups can help him remain sober and successful in recovery.        Smart, Jolin Benavides LCSWA 03/28/2013, 2:34 PM

## 2013-03-28 NOTE — Progress Notes (Signed)
D:Pt presents with mild anxiety and tremors. Pt is interacting appropriately with staff and peers.  A:Offered support, encouragement and 15 minute checks.  R:Pt denies si and hi. Safety maintained on the unit.

## 2013-03-28 NOTE — Progress Notes (Signed)
Recreation Therapy Notes   Animal-Assisted Activity/Therapy (AAA/T) Program Checklist/Progress Notes Patient Eligibility Criteria Checklist & Daily Group note for Rec Tx Intervention  Date: 03.10.2015 Time: 2:45pm Location: [address]    AAA/T Program Assumption of Risk Form signed by Patient/ or Parent Legal Guardian yes  Patient is free of allergies or sever asthma yes  Patient reports no fear of animals yes  Patient reports no history of cruelty to animals yes   Patient understands his/her participation is voluntary yes  Behavioral Response: Did not attend.    [name] [name], LRT/CTRS  [name] 03/28/2013 4:06 PM

## 2013-03-28 NOTE — Progress Notes (Signed)
[facility] MD Progress Note  03/28/2013 6:10 PM Muriel Wilber  MRN:  [id number] Subjective:  [place] continues to detox. He states he is "slowly getting there." states he still cant belief that he allowed himself to get to this point. This "friend" is not going to be around anymore so he can get back to his regular routine, trying to stay busy, etc. He slept some more last night.  Diagnosis:   DSM5: Schizophrenia Disorders:  none Obsessive-Compulsive Disorders:  none Trauma-Stressor Disorders:  none Substance/Addictive Disorders:  Alcohol Related Disorder - Severe (303.90) Cocaine related disorder Depressive Disorders:  Major Depressive Disorder - Mild (296.21) Total Time spent with patient: 30 minutes  Axis I: Generalized Anxiety Disorder  ADL's:  Intact  Sleep: Fair  Appetite:  Fair  Suicidal Ideation:  Plan:  denies Intent:  denies Means:  denies Homicidal Ideation:  Plan:  denies Intent:  denies Means:  denies AEB (as evidenced by):  Psychiatric Specialty Exam: Physical Exam  Review of Systems  HENT: Negative.   Eyes: Negative.   Respiratory: Negative.   Cardiovascular: Negative.   Gastrointestinal: Negative.   Genitourinary: Negative.   Musculoskeletal: Positive for back pain and joint pain.  Skin: Negative.   Neurological: Positive for weakness.  Endo/Heme/Allergies: Negative.   Psychiatric/Behavioral: Positive for substance abuse. The patient is nervous/anxious.     Blood pressure 130/83, pulse 101, temperature 98.8 F (37.1 C), temperature source Oral, resp. rate 16, height 6\' 2"  (1.88 m), weight 92.987 kg (205 lb), SpO2 98.00%.Body mass index is 26.31 kg/(m^2).  General Appearance: Fairly Groomed  Engineer, water::  Fair  Speech:  Clear and Coherent and Slow  Volume:  Decreased  Mood:  Anxious and worried, regretful  Affect:  anxious, worried  Thought Process:  Coherent and Goal Directed  Orientation:  Full (Time, Place, and Person)  Thought Content:  symptoms,  worries, cocerns  Suicidal Thoughts:  No  Homicidal Thoughts:  No  Memory:  Immediate;   Fair Recent;   Fair Remote;   Fair  Judgement:  Fair  Insight:  Present  Psychomotor Activity:  Restlessness  Concentration:  Fair  Recall:  [employer] of Knowledge:Fair  Language: NA  Akathisia:  No  Handed:    AIMS (if indicated):     Assets:  Desire for Improvement Housing  Sleep:  Number of Hours: 5.75   Musculoskeletal: Strength & Muscle Tone: within normal limits Gait & Station: unsteady Patient leans: N/A  Current Medications: Current Facility-Administered Medications  Medication Dose Route Frequency Provider Last Rate Last Dose  . acetaminophen (TYLENOL) tablet 650 mg  650 mg Oral Q6H PRN Lurena Nida, NP      . alum & mag hydroxide-simeth (MAALOX/MYLANTA) 200-200-20 MG/5ML suspension 30 mL  30 mL Oral Q4H PRN Lurena Nida, NP      . Derrill Memo ON 03/29/2013] chlordiazePOXIDE (LIBRIUM) capsule 25 mg  25 mg Oral Daily Lurena Nida, NP      . cloNIDine (CATAPRES) tablet 0.1 mg  0.1 mg Oral BH-qamhs Lurena Nida, NP   0.1 mg at 03/28/13 0806   Followed by  . [START ON 03/30/2013] cloNIDine (CATAPRES) tablet 0.1 mg  0.1 mg Oral QAC breakfast Lurena Nida, NP      . dicyclomine (BENTYL) tablet 20 mg  20 mg Oral Q6H PRN Lurena Nida, NP      . docusate sodium (COLACE) capsule 100 mg  100 mg Oral BID Nicholaus Bloom, MD   100 mg  at 03/28/13 0804  . escitalopram (LEXAPRO) tablet 20 mg  20 mg Oral q morning - 10a Lurena Nida, NP   20 mg at 03/28/13 0804  . gabapentin (NEURONTIN) capsule 400 mg  400 mg Oral QID Lurena Nida, NP   400 mg at 03/28/13 1705  . hydrOXYzine (ATARAX/VISTARIL) tablet 25 mg  25 mg Oral Q6H PRN Lurena Nida, NP   25 mg at 03/28/13 1308  . loperamide (IMODIUM) capsule 2-4 mg  2-4 mg Oral PRN Lurena Nida, NP      . magnesium hydroxide (MILK OF MAGNESIA) suspension 30 mL  30 mL Oral Daily PRN Lurena Nida, NP   30 mL at 03/27/13 1046  . methocarbamol (ROBAXIN)  tablet 500 mg  500 mg Oral TID Nicholaus Bloom, MD   500 mg at 03/28/13 1705  . mirtazapine (REMERON) tablet 45 mg  45 mg Oral QHS Lurena Nida, NP   45 mg at 03/27/13 2158  . multivitamin with minerals tablet 1 tablet  1 tablet Oral Daily Lurena Nida, NP   1 tablet at 03/28/13 0805  . nabumetone (RELAFEN) tablet 500 mg  500 mg Oral BID Nicholaus Bloom, MD   500 mg at 03/28/13 1705  . naproxen (NAPROSYN) tablet 500 mg  500 mg Oral BID PRN Lurena Nida, NP   500 mg at 03/27/13 [id number]  . nicotine (NICODERM CQ - dosed in mg/24 hours) patch 21 mg  21 mg Transdermal Daily Nicholaus Bloom, MD   21 mg at 03/28/13 0805  . ondansetron (ZOFRAN-ODT) disintegrating tablet 4 mg  4 mg Oral Q6H PRN Lurena Nida, NP      . polyethylene glycol (MIRALAX / GLYCOLAX) packet 17 g  17 g Oral BID Encarnacion Slates, NP   17 g at 03/28/13 0801  . QUEtiapine (SEROQUEL) tablet 200 mg  200 mg Oral QHS Lurena Nida, NP   200 mg at 03/27/13 2158  . sodium phosphate (FLEET) 7-19 GM/118ML enema 1 enema  1 enema Rectal Once Lurena Nida, NP      . thiamine (VITAMIN B-1) tablet 100 mg  100 mg Oral Daily Lurena Nida, NP   100 mg at 03/28/13 [id number]    Lab Results:  Results for orders placed during the hospital encounter of 03/24/13 (from the past 48 hour(s))  GLUCOSE, CAPILLARY     Status: Abnormal   Collection Time    03/26/13  9:27 PM      Result Value Ref Range   Glucose-Capillary 139 (*) 70 - 99 mg/dL   Comment 1 Notify RN     Comment 2 Documented in Chart    GLUCOSE, CAPILLARY     Status: Abnormal   Collection Time    03/27/13  5:46 AM      Result Value Ref Range   Glucose-Capillary 128 (*) 70 - 99 mg/dL   Comment 1 Notify RN     Comment 2 Documented in Chart    GLUCOSE, CAPILLARY     Status: Abnormal   Collection Time    03/28/13  7:29 AM      Result Value Ref Range   Glucose-Capillary 220 (*) 70 - 99 mg/dL    Physical Findings: AIMS: Facial and Oral Movements Muscles of Facial Expression: None, normal Lips and  Perioral Area: None, normal Jaw: None, normal Tongue: None, normal,Extremity Movements Upper (arms, wrists, hands, fingers): None, normal Lower (legs, knees, ankles, toes): None,  normal, Trunk Movements Neck, shoulders, hips: None, normal, Overall Severity Severity of abnormal movements (highest score from questions above): None, normal Incapacitation due to abnormal movements: None, normal Patient's awareness of abnormal movements (rate only patient's report): No Awareness, Dental Status Current problems with teeth and/or dentures?: No Does patient usually wear dentures?: No  CIWA:  CIWA-Ar Total: 2 COWS:     Treatment Plan Summary: Daily contact with patient to assess and evaluate symptoms and progress in treatment Medication management  Plan: Supportive approach/copign skills/relapse prevention           Continue to address the co morbidities  Medical Decision Making Problem Points:  Established problem, stable/improving (1) and Review of psycho-social stressors (1) Data Points:  Review of medication regiment & side effects (2)  I certify that inpatient services furnished can reasonably be expected to improve the patient's condition.   Lucinda Spells A 03/28/2013, 6:10 PM

## 2013-03-28 NOTE — Progress Notes (Signed)
Resting quietly with eyes closed. Respirations even and unlabored. No distress noted, Q 15 minute check continues as ordered to maintain safety.

## 2013-03-29 DIAGNOSIS — F1020 Alcohol dependence, uncomplicated: Principal | ICD-10-CM

## 2013-03-29 LAB — GLUCOSE, CAPILLARY: Glucose-Capillary: 110 mg/dL — ABNORMAL HIGH (ref 70–99)

## 2013-03-29 MED ORDER — ESCITALOPRAM OXALATE 20 MG PO TABS: 20.0000 mg | ORAL_TABLET | Freq: Every morning | ORAL | Status: DC

## 2013-03-29 MED ORDER — QUETIAPINE FUMARATE 200 MG PO TABS: 200.0000 mg | ORAL_TABLET | Freq: Every day | ORAL | Status: DC

## 2013-03-29 MED ORDER — GABAPENTIN 400 MG PO CAPS: 400.0000 mg | ORAL_CAPSULE | Freq: Four times a day (QID) | ORAL | Status: DC

## 2013-03-29 MED ORDER — HYDROXYZINE HCL 25 MG PO TABS: ORAL_TABLET | ORAL | Status: DC

## 2013-03-29 MED ORDER — CLONIDINE HCL 0.1 MG PO TABS: 0.1000 mg | ORAL_TABLET | Freq: Every day | ORAL | Status: DC

## 2013-03-29 MED ORDER — DSS 100 MG PO CAPS: 100.0000 mg | ORAL_CAPSULE | Freq: Two times a day (BID) | ORAL | Status: DC

## 2013-03-29 MED ORDER — POLYETHYLENE GLYCOL 3350 17 G PO PACK: 17.0000 g | PACK | Freq: Two times a day (BID) | ORAL | Status: DC

## 2013-03-29 MED ORDER — HYDROXYZINE HCL 25 MG PO TABS
25.0000 mg | ORAL_TABLET | Freq: Three times a day (TID) | ORAL | Status: DC | PRN
  Filled 2013-03-29 (×2): qty 10

## 2013-03-29 MED ORDER — MIRTAZAPINE 45 MG PO TABS: 45.0000 mg | ORAL_TABLET | Freq: Every day | ORAL | Status: DC

## 2013-03-29 NOTE — Progress Notes (Signed)
Discharge Note: Discharge instructions/prescriptions/medication samples given to patient. Patient verbalized understanding of discharge instructions and prescriptions. Returned belongings to patient. Denies SI/HI/AVH. Patient d/c without incident to the lobby and transported home by parents.

## 2013-03-29 NOTE — Tx Team (Signed)
Interdisciplinary Treatment Plan Update (Adult)  Date: 03/29/2013   Time Reviewed: 10:37 AM  Progress in Treatment:  Attending groups: Yes  Participating in groups:  Yes Taking medication as prescribed: Yes  Tolerating medication: Yes  Family/Significant othe contact made: No. SPE not required for this pt.  Patient understands diagnosis: Yes, AEB seeking treatment for ETOH detox, cocaine abuse, and mood stabilization.  Discussing patient identified problems/goals with staff: Yes  Medical problems stabilized or resolved: Yes  Denies suicidal/homicidal ideation: Yes during admission, group, and self report.  Patient has not harmed self or Others: Yes  New problem(s) identified:  Discharge Plan or Barriers: Appts made for med management and therapy through [facility]. Pt will return home with parents today.  Additional comments: n/a  Reason for Continuation of Hospitalization: d/c today  Estimated length of stay: d/c today For review of initial/current patient goals, please see plan of care.  Attendees:  Patient:    Family:    Physician: 03/29/2013 10:37 AM   Nursing: Butch Penny RN  03/29/2013 10:37 AM   Clinical Social Worker [place], [place]  03/29/2013 10:37 AM   Other: Arminda Resides RN 03/29/2013 10:37 AM   Other: Hardie Pulley. PA 03/29/2013 10:37 AM   Other:   03/29/2013 10:37 AM   Other:    Scribe for Treatment Team:  [place] LCSWA 03/29/2013 10:37 AM

## 2013-03-29 NOTE — [facility] Group Notes (Signed)
[facility] LCSW Aftercare Discharge Planning Group Note   03/29/2013 10:05 AM  Participation Quality:  Appropriate   Mood/Affect:  Appropriate  Depression Rating:  0  Anxiety Rating:  4  Thoughts of Suicide:  No Will you contract for safety?   NA  Current AVH:  No  Plan for Discharge/Comments: Pt reports that he is ready for d/c today. He has followup appt tomorrow at [facility] in [facility]. Therapy appt scheduled for next week. No withdrawal symptoms.   Transportation Means: parents   Supports: parents   Smart, Research officer, trade union

## 2013-03-29 NOTE — Discharge Summary (Signed)
Physician Discharge Summary Note  Patient:  [name] is an 48 y.o., male MRN:  [id number] DOB:  [birth year] Patient phone:  [phone] (home)  Patient address:   [place] [address],  Total Time spent with patient: Greater than 30 minutes  Date of Admission:  03/24/2013  Date of Discharge: 03/29/13  Reason for Admission:  Alcohol detox  Discharge Diagnoses: Active Problems:   Substance abuse   Psychiatric Specialty Exam: Physical Exam  Constitutional: He is oriented to person, place, and time. He appears well-developed.  HENT:  Head: Normocephalic.  Eyes: Pupils are equal, round, and reactive to light.  Neck: Normal range of motion.  Cardiovascular: Normal rate.   Respiratory: Effort normal.  GI: Soft.  Genitourinary:  Denies any issues in this area  Musculoskeletal: Normal range of motion.  Neurological: He is alert and oriented to person, place, and time.  Skin: Skin is warm and dry.  Psychiatric: His speech is normal and behavior is normal. Judgment and thought content normal. His mood appears not anxious. His affect is not angry, not blunt, not labile and not inappropriate. Cognition and memory are normal. He does not exhibit a depressed mood.    Review of Systems  Constitutional: Negative.   HENT: Negative.   Eyes: Negative.   Respiratory: Negative.   Cardiovascular: Negative.   Gastrointestinal: Negative.   Genitourinary: Negative.   Musculoskeletal: Negative.   Skin: Negative.   Neurological: Negative.   Endo/Heme/Allergies: Negative.   Psychiatric/Behavioral: Positive for depression (Stable) and substance abuse (Alcohol dependence). Negative for suicidal ideas, hallucinations and memory loss. The patient has insomnia (Stable). The patient is not nervous/anxious.     Blood pressure 118/77, pulse 94, temperature 97.6 F (36.4 C), temperature source Oral, resp. rate 18, height 6\' 2"  (1.88 m), weight 92.987 kg (205 lb), SpO2 98.00%.Body mass  index is 26.31 kg/(m^2).  General Appearance: Casual and Fairly Groomed  Engineer, water::  Good  Speech:  Clear and Coherent  Volume:  Normal  Mood:  Stable  Affect:  Congruent  Thought Process:  Coherent and Logical  Orientation:  Full (Time, Place, and Person)  Thought Content:  Denies hallucinations, delusional thoughts and or paranoia  Suicidal Thoughts:  No  Homicidal Thoughts:  No  Memory:  Immediate;   Good Recent;   Good Remote;   Good  Judgement:  Fair  Insight:  Fair  Psychomotor Activity:  Normal  Concentration:  Good  Recall:  Good  Fund of Knowledge:Fair  Language: Fair  Akathisia:  No  Handed:  Right  AIMS (if indicated):     Assets:  Desire for Improvement  Sleep:  Number of Hours: 4.75    Past Psychiatric History: Diagnosis: Alcohol Related Disorder - Severe (303.90)  Hospitalizations: [facility] adult unit, multiple times  Outpatient Care: [facility]  Substance Abuse Care: [facility]  Self-Mutilation: Denies  Suicidal Attempts: Denies  Violent Behaviors: Denies   Musculoskeletal: Strength & Muscle Tone: within normal limits Gait & Station: normal Patient leans: N/A  DSM5: Schizophrenia Disorders:  NA Obsessive-Compulsive Disorders:  NA Trauma-Stressor Disorders:  NA Substance/Addictive Disorders:  Alcohol Related Disorder - Severe (303.90) Depressive Disorders:  Generalized anxiety disorder  Axis Diagnosis:   AXIS I:  Alcohol Related Disorder - Severe (303.90) AXIS II:  Deferred AXIS III:   Past Medical History  Diagnosis Date  . Diabetic foot ulcer   . ETOH abuse   . Anxiety   . Open wound  bottom of foot  . Diabetes mellitus without complication     borderline  . Neuromuscular disorder     neuropathy  . GERD (gastroesophageal reflux disease)     tums  . Pneumonia ~ 2012  . History of blood transfusion     "related to left knee OR; probably right hip too" (04/21/2012)  . Stroke 2008    "they said I might have had one during  right hip replacement" (04/21/2012)  . Arthritis     "everywhere" (04/21/2012)  . Mental disorder   . Depression   . DDD (degenerative disc disease)    AXIS IV:  Alcoholism, chronic AXIS V:  62  Level of Care:  OP  Hospital Course:  [name] is a 48 year old male who presented to [facility] for detox from alcohol and cocaine. He reports running out of money,and couldn't get any more beer or nothing and he started getting sick (shaking sweating, vomiting). His last drink was Friday morning at 9am, 12pk of 12 ounce beers. He reports a history of drinking for the last 2.5 weeks.  While a patient in this hospital, [name] received Librium detox protocol for his detoxification treatment. He was also enrolled in group counseling sessions, AA/NA meetings. He learned coping skills to help him cope better and manage his substance abuse issues better. [name] was also medicated with Lexapro 20 mg daily for depression, Gabapentin 400 mg four times daily for substance withdrawal syndrome, Mirtazapine 45 mg Q bedtime for depression/sleep and Seroquel 200 mg Q bedtime for mood control. [name] also received medication management for other medical issues that he presented. He tolerated his treatment regimen without any significant adverse effects and or reaction reported.  [name] has completed detox and his mood stabilized. He is currently being discharged to continue psychiatric care on an outpatient basis. He will follow-up care at the [facility] here in [place], [place]. He has been provided with all the pertinent information needed to make this appointment without issues. Upon discharge, [name] adamantly denies any suicidal, homicidal ideations, auditory, visual hallucinations, delusions, paranoia and or withdrawal symptoms. He receivd 4 days worth supply samples of his [facility] discharge medications. Transportation per parents.  Consults:  psychiatry  Significant Diagnostic Studies:  labs: CBC with diff, CMP, UDS,  toxicology tests, U/A  Discharge Vitals:   Blood pressure 118/77, pulse 94, temperature 97.6 F (36.4 C), temperature source Oral, resp. rate 18, height 6\' 2"  (1.88 m), weight 92.987 kg (205 lb), SpO2 98.00%. Body mass index is 26.31 kg/(m^2). Lab Results:   Results for orders placed during the hospital encounter of 03/24/13 (from the past 72 hour(s))  GLUCOSE, CAPILLARY     Status: Abnormal   Collection Time    03/26/13  9:27 PM      Result Value Ref Range   Glucose-Capillary 139 (*) 70 - 99 mg/dL   Comment 1 Notify RN     Comment 2 Documented in Chart    GLUCOSE, CAPILLARY     Status: Abnormal   Collection Time    03/27/13  5:46 AM      Result Value Ref Range   Glucose-Capillary 128 (*) 70 - 99 mg/dL   Comment 1 Notify RN     Comment 2 Documented in Chart    GLUCOSE, CAPILLARY     Status: Abnormal   Collection Time    03/28/13  7:29 AM      Result Value Ref Range   Glucose-Capillary 220 (*)  70 - 99 mg/dL  GLUCOSE, CAPILLARY     Status: Abnormal   Collection Time    03/28/13  9:20 PM      Result Value Ref Range   Glucose-Capillary 170 (*) 70 - 99 mg/dL   Comment 1 Notify RN     Comment 2 Documented in Chart    GLUCOSE, CAPILLARY     Status: Abnormal   Collection Time    03/29/13  6:09 AM      Result Value Ref Range   Glucose-Capillary 110 (*) 70 - 99 mg/dL    Physical Findings: AIMS: Facial and Oral Movements Muscles of Facial Expression: None, normal Lips and Perioral Area: None, normal Jaw: None, normal Tongue: None, normal,Extremity Movements Upper (arms, wrists, hands, fingers): None, normal Lower (legs, knees, ankles, toes): None, normal, Trunk Movements Neck, shoulders, hips: None, normal, Overall Severity Severity of abnormal movements (highest score from questions above): None, normal Incapacitation due to abnormal movements: None, normal Patient's awareness of abnormal movements (rate only patient's report): No Awareness, Dental Status Current problems  with teeth and/or dentures?: No Does patient usually wear dentures?: No  CIWA:  CIWA-Ar Total: 2 COWS:     Psychiatric Specialty Exam: See Psychiatric Specialty Exam and Suicide Risk Assessment completed by Attending Physician prior to discharge.  Discharge destination:  Home  Is patient on multiple antipsychotic therapies at discharge:  No   Has Patient had three or more failed trials of antipsychotic monotherapy by history:  No  Recommended Plan for Multiple Antipsychotic Therapies: NA     Medication List    STOP taking these medications       clonazePAM 0.5 MG tablet  Commonly known as:  KLONOPIN     ibuprofen 200 MG tablet  Commonly known as:  ADVIL,MOTRIN      TAKE these medications     Indication   cloNIDine 0.1 MG tablet  Commonly known as:  CATAPRES  Take 1 tablet (0.1 mg total) by mouth daily before breakfast. For hypertension  Start taking on:  03/30/2013   Indication:  High Blood Pressure     DSS 100 MG Caps  Take 100 mg by mouth 2 (two) times daily. (This medicine can be purchased from over the counter at yr local pharmacy):For constipation   Indication:  Constipation     escitalopram 20 MG tablet  Commonly known as:  LEXAPRO  Take 1 tablet (20 mg total) by mouth every morning. For depression   Indication:  Depression     gabapentin 400 MG capsule  Commonly known as:  NEURONTIN  Take 1 capsule (400 mg total) by mouth 4 (four) times daily. For substance withdrawal syndrome   Indication:  Alcohol Withdrawal Syndrome, neuropathic pain/anxiety     hydrOXYzine 25 MG tablet  Commonly known as:  ATARAX/VISTARIL  Take 1 tablet (25 mg) three times daily as needed for anxiety   Indication:  Tension, Anxiety     mirtazapine 45 MG tablet  Commonly known as:  REMERON  Take 1 tablet (45 mg total) by mouth at bedtime. For depression/sleep   Indication:  Trouble Sleeping, Major Depressive Disorder     polyethylene glycol packet  Commonly known as:  MIRALAX /  GLYCOLAX  Take 17 g by mouth 2 (two) times daily. For constipation   Indication:  Constipation     QUEtiapine 200 MG tablet  Commonly known as:  SEROQUEL  Take 1 tablet (200 mg total) by mouth at bedtime. For mood control  Indication:  Mood control       Follow-up Information   [facility] with [facility] On 03/30/2013. (Appt. at 3:30PM for hospital followup with doctor. )    Contact information:   [address] [place], [place] [address] Phone: [phone] Fax: [phone]      [facility] up with [facility] [place]  On 04/03/2013. (Arrive at 4pm for therapy appt. )    Contact information:   [place],  [address] Phone: [phone] Fax: [phone]     Follow-up recommendations:   Activity:  As tolerated Diet: As recommended by your primary care doctor. Keep all scheduled follow-up appointments as recommended.  Comments:  Take all your medications as prescribed by your mental healthcare provider. Report any adverse effects and or reactions from your medicines to your outpatient provider promptly. Patient is instructed and cautioned to not engage in alcohol and or illegal drug use while on prescription medicines. In the event of worsening symptoms, patient is instructed to call the crisis hotline, 911 and or go to the nearest ED for appropriate evaluation and treatment of symptoms. Follow-up with your primary care provider for your other medical issues, concerns and or health care needs.   Total Discharge Time:  Greater than 30 minutes.  Signed: Encarnacion Slates, PMHNP-BC 03/29/2013, 11:29 AM Personally  evaluated the patient formulated the treatment plan and agree with the assessment  Geralyn Flash A. Sabra Heck, M.D.

## 2013-03-29 NOTE — [facility] Suicide Risk Assessment (Signed)
Suicide Risk Assessment  Discharge Assessment     Demographic Factors:  Male and Caucasian  Total Time spent with patient: 45 minutes  Psychiatric Specialty Exam:     Blood pressure 118/77, pulse 94, temperature 97.6 F (36.4 C), temperature source Oral, resp. rate 18, height 6\' 2"  (1.88 m), weight 92.987 kg (205 lb), SpO2 98.00%.Body mass index is 26.31 kg/(m^2).  General Appearance: Fairly Groomed  Engineer, water::  Fair  Speech:  Clear and Coherent  Volume:  Normal  Mood:  Euthymic  Affect:  Appropriate  Thought Process:  Coherent and Goal Directed  Orientation:  Full (Time, Place, and Person)  Thought Content:  relapse prevention  Suicidal Thoughts:  No  Homicidal Thoughts:  No  Memory:  Immediate;   Fair Recent;   Fair Remote;   Fair  Judgement:  Fair  Insight:  Present  Psychomotor Activity:  Normal  Concentration:  Fair  Recall:  [employer] of Knowledge:NA  Language: Fair  Akathisia:  No  Handed:  Right  AIMS (if indicated):     Assets:  Desire for Improvement Housing Social Support Transportation  Sleep:  Number of Hours: 4.75    Musculoskeletal: Strength & Muscle Tone: decreased Gait & Station: normal Patient leans: N/A   Mental Status Per Nursing Assessment::   On Admission:  NA  Current Mental Status by Physician: In full contact with reality. There are no suicidal ideas, plans or intent. No active S/S of withdrawal.    Loss Factors: Decline in physical health  Historical Factors: NA  Risk Reduction Factors:   Sense of responsibility to family, Living with another person, especially a relative, Positive social support and Positive therapeutic relationship  Continued Clinical Symptoms:  Alcohol/Substance Abuse/Dependencies  Cognitive Features That Contribute To Risk:  Closed-mindedness Polarized thinking Thought constriction (tunnel vision)    Suicide Risk:  Minimal: No identifiable suicidal ideation.  Patients presenting with no risk  factors but with morbid ruminations; may be classified as minimal risk based on the severity of the depressive symptoms  Discharge Diagnoses:   AXIS I:  Alcohol Dependence Cocaine Abuse, GAD. Substance Induced Mood Disorder AXIS II:  Deferred AXIS III:   Past Medical History  Diagnosis Date  . Diabetic foot ulcer   . ETOH abuse   . Anxiety   . Open wound     bottom of foot  . Diabetes mellitus without complication     borderline  . Neuromuscular disorder     neuropathy  . GERD (gastroesophageal reflux disease)     tums  . Pneumonia ~ 2012  . History of blood transfusion     "related to left knee OR; probably right hip too" (04/21/2012)  . Stroke 2008    "they said I might have had one during right hip replacement" (04/21/2012)  . Arthritis     "everywhere" (04/21/2012)  . Mental disorder   . Depression   . DDD (degenerative disc disease)    AXIS IV:  other psychosocial or environmental problems AXIS V:  61-70 mild symptoms  Plan Of Care/Follow-up recommendations:  Activity:  as tolerated Diet:  regular Follow up [facility] and PCP Is patient on multiple antipsychotic therapies at discharge:  No   Has Patient had three or more failed trials of antipsychotic monotherapy by history:  No  Recommended Plan for Multiple Antipsychotic Therapies: NA    [name] A 03/29/2013, 1:13 PM

## 2013-03-29 NOTE — Progress Notes (Signed)
[facility] Discharge Plan :  Will you be returning to the same living situation after discharge: Yes,  home with parents At discharge, do you have transportation home?:Yes,  parents Do you have the ability to pay for your medications:Yes,  Medicare  Release of information consent forms completed and submitted to Medical Records by CSW.  Patient to Follow up at: Follow-up Information   [facility] On 03/30/2013. (Appt. at 3:30PM for hospital followup with doctor. )    Contact information:   [address] [place], [place] [address] Phone: [phone] Fax: [phone]      [facility] up with [facility] [place]  On 04/03/2013. (Arrive at 4pm for therapy appt. )    Contact information:   [place], [place] [address] Phone: [phone] Fax: [phone]      Patient denies SI/HI:   Yes,  during admission, group, and self report.    Safety Planning and Suicide Prevention discussed:  Yes,  SPE not required. SPI pamphlet provided to pt and he was encouraged to share information with support network, ask questions, and talk about any concerns relating to SPE.  Smart, Izel Eisenhardt LCSWA  03/29/2013, 10:35 AM

## 2013-04-03 NOTE — Progress Notes (Signed)
Patient Discharge Instructions:  After Visit Summary (AVS):   Faxed to:  04/03/13 Discharge Summary Note:   Faxed to:  04/03/13 Psychiatric Admission Assessment Note:   Faxed to:  04/03/13 Suicide Risk Assessment - Discharge Assessment:   Faxed to:  04/03/13 Faxed/Sent to the Next Level Care provider:  04/03/13 Faxed to [facility] [place], 04/03/2013, 3:27 PM

## 2013-05-11 ENCOUNTER — Emergency Department (HOSPITAL_COMMUNITY)
Admission: EM | Admit: 2013-05-11 | Discharge: 2013-05-11 | Disposition: A | Discharge status: Home / Self Care | Payer: Medicare Other | Attending: Emergency Medicine | Admitting: Emergency Medicine

## 2013-05-11 ENCOUNTER — Emergency Department (HOSPITAL_COMMUNITY): Payer: Medicare Other

## 2013-05-11 ENCOUNTER — Encounter (HOSPITAL_COMMUNITY): Payer: Self-pay | Admitting: Emergency Medicine

## 2013-05-11 DIAGNOSIS — S7000XA Contusion of unspecified hip, initial encounter: Secondary | ICD-10-CM | POA: Insufficient documentation

## 2013-05-11 DIAGNOSIS — Z79899 Other long term (current) drug therapy: Secondary | ICD-10-CM | POA: Insufficient documentation

## 2013-05-11 DIAGNOSIS — W19XXXA Unspecified fall, initial encounter: Secondary | ICD-10-CM

## 2013-05-11 DIAGNOSIS — Z9889 Other specified postprocedural states: Secondary | ICD-10-CM | POA: Insufficient documentation

## 2013-05-11 DIAGNOSIS — F411 Generalized anxiety disorder: Secondary | ICD-10-CM | POA: Insufficient documentation

## 2013-05-11 DIAGNOSIS — M79604 Pain in right leg: Secondary | ICD-10-CM

## 2013-05-11 DIAGNOSIS — Z8673 Personal history of transient ischemic attack (TIA), and cerebral infarction without residual deficits: Secondary | ICD-10-CM | POA: Insufficient documentation

## 2013-05-11 DIAGNOSIS — S300XXA Contusion of lower back and pelvis, initial encounter: Secondary | ICD-10-CM

## 2013-05-11 DIAGNOSIS — F3289 Other specified depressive episodes: Secondary | ICD-10-CM | POA: Insufficient documentation

## 2013-05-11 DIAGNOSIS — S8990XA Unspecified injury of unspecified lower leg, initial encounter: Secondary | ICD-10-CM | POA: Insufficient documentation

## 2013-05-11 DIAGNOSIS — M25561 Pain in right knee: Secondary | ICD-10-CM

## 2013-05-11 DIAGNOSIS — Z8669 Personal history of other diseases of the nervous system and sense organs: Secondary | ICD-10-CM | POA: Insufficient documentation

## 2013-05-11 DIAGNOSIS — E119 Type 2 diabetes mellitus without complications: Secondary | ICD-10-CM | POA: Insufficient documentation

## 2013-05-11 DIAGNOSIS — Y92009 Unspecified place in unspecified non-institutional (private) residence as the place of occurrence of the external cause: Secondary | ICD-10-CM | POA: Insufficient documentation

## 2013-05-11 DIAGNOSIS — F329 Major depressive disorder, single episode, unspecified: Secondary | ICD-10-CM | POA: Insufficient documentation

## 2013-05-11 DIAGNOSIS — Z96649 Presence of unspecified artificial hip joint: Secondary | ICD-10-CM | POA: Insufficient documentation

## 2013-05-11 DIAGNOSIS — F17200 Nicotine dependence, unspecified, uncomplicated: Secondary | ICD-10-CM | POA: Insufficient documentation

## 2013-05-11 DIAGNOSIS — S99929A Unspecified injury of unspecified foot, initial encounter: Principal | ICD-10-CM

## 2013-05-11 DIAGNOSIS — S99919A Unspecified injury of unspecified ankle, initial encounter: Principal | ICD-10-CM

## 2013-05-11 DIAGNOSIS — Y9389 Activity, other specified: Secondary | ICD-10-CM | POA: Insufficient documentation

## 2013-05-11 DIAGNOSIS — W010XXA Fall on same level from slipping, tripping and stumbling without subsequent striking against object, initial encounter: Secondary | ICD-10-CM | POA: Insufficient documentation

## 2013-05-11 DIAGNOSIS — Z8739 Personal history of other diseases of the musculoskeletal system and connective tissue: Secondary | ICD-10-CM | POA: Insufficient documentation

## 2013-05-11 DIAGNOSIS — Z8719 Personal history of other diseases of the digestive system: Secondary | ICD-10-CM | POA: Insufficient documentation

## 2013-05-11 DIAGNOSIS — Z96659 Presence of unspecified artificial knee joint: Secondary | ICD-10-CM | POA: Insufficient documentation

## 2013-05-11 DIAGNOSIS — Z8701 Personal history of pneumonia (recurrent): Secondary | ICD-10-CM | POA: Insufficient documentation

## 2013-05-11 MED ORDER — OXYCODONE-ACETAMINOPHEN 5-325 MG PO TABS: 2.0000 | ORAL_TABLET | ORAL | Status: DC | PRN

## 2013-05-11 MED ORDER — HYDROMORPHONE HCL PF 1 MG/ML IJ SOLN
1.0000 mg | Freq: Once | INTRAMUSCULAR | Status: AC
  Administered 2013-05-11: 1 mg via INTRAMUSCULAR
  Filled 2013-05-11: qty 1

## 2013-05-11 NOTE — ED Provider Notes (Signed)
CSN: [id number]     Arrival date & time 05/11/13  1020 History  This chart was scribed for Nat Christen, MD by Roxan Diesel, ED scribe.  This patient was seen in room APA08/APA08 and the patient's care was started at 11:21 AM.   Chief Complaint  Patient presents with  . Fall    The history is provided by the patient. No language interpreter was used.    HPI Comments: [name] is a 48 y.o. male with h/o degenerative joint disease, bilateral knee replacement and right hip replacement who presents to the Emergency Department complaining of a fall that occurred 2 hours pta with subsequent right hip pain.  Pt states he was standing on the 2nd from the bottom step at home when his knee "gave out" and he fell onto his right hip.  He denies head impact or LOC.  Since then he has had severe right hip pain radiating to his right groin and right femur to the knee.  He has been able to ambulate but has had significant pain with weight-bearing, worst at the right femur but extending up to the hip area.  He denies headache or neck pain.  He denies pain to right lower leg or left leg.  Pt typically walks with a cane due to his joint problems.  He states he has been diagnosed with degenerative joint disease and had his first knee surgery at age 37.  He lives independently.   Past Medical History  Diagnosis Date  . Diabetic foot ulcer   . ETOH abuse   . Anxiety   . Open wound     bottom of foot  . Diabetes mellitus without complication     borderline  . Neuromuscular disorder     neuropathy  . GERD (gastroesophageal reflux disease)     tums  . Pneumonia ~ 2012  . History of blood transfusion     "related to left knee OR; probably right hip too" (04/21/2012)  . Stroke 2008    "they said I might have had one during right hip replacement" (04/21/2012)  . Arthritis     "everywhere" (04/21/2012)  . Mental disorder   . Depression   . DDD (degenerative disc disease)     Past Surgical History   Procedure Laterality Date  . Total hip arthroplasty Right 2008  . Total knee arthroplasty Left 2006  . Joint replacement    . Lung lobectomy Left ~ 2006  . Revision total hip arthroplasty Right 2008    "4-5 months after replacement" (04/21/2012)  . Knee arthroscopy Bilateral 1980's/1990's  . Lung lobectomy    . Metatarsal osteotomy  10/29/2011    Procedure: METATARSAL OSTEOTOMY;  Surgeon: [name], MD;  Location: [facility];  Service: Orthopedics;  Laterality: Left;  Left 1st Metatarsal Dorsal Closing Wedge   . Total knee arthroplasty Right 04/20/2012  . Total knee arthroplasty Right 04/20/2012    Procedure: TOTAL KNEE ARTHROPLASTY;  Surgeon: [name], MD;  Location: [facility];  Service: Orthopedics;  Laterality: Right;  Right Total Knee Arthroplasty    History reviewed. No pertinent family history.   History  Substance Use Topics  . Smoking status: Current Every Day Smoker -- 1.50 packs/day for 30 years    Types: Cigarettes  . Smokeless tobacco: Never Used     Comment: 04/21/2012 offered smoking cessation materials; pt declines  . Alcohol Use: 0.0 oz/week     Comment: last used march 11  Review of Systems A complete 10 system review of systems was obtained and all systems are negative except as noted in the HPI and PMH.     Allergies  Benadryl and Trazodone and nefazodone  Home Medications   Prior to Admission medications   Medication Sig Start Date End Date Taking? Authorizing Provider  escitalopram (LEXAPRO) 20 MG tablet Take 1 tablet (20 mg total) by mouth every morning. For depression 03/29/13  Yes Encarnacion Slates, NP  gabapentin (NEURONTIN) 400 MG capsule Take 1 capsule (400 mg total) by mouth 4 (four) times daily. For substance withdrawal syndrome 03/29/13  Yes Encarnacion Slates, NP  mirtazapine (REMERON) 45 MG tablet Take 1 tablet (45 mg total) by mouth at bedtime. For depression/sleep 03/29/13  Yes Encarnacion Slates, NP  QUEtiapine (SEROQUEL) 200 MG tablet Take 1 tablet (200 mg  total) by mouth at bedtime. For mood control 03/29/13  Yes Encarnacion Slates, NP   BP 147/83  Pulse 102  Temp(Src) 98 F (36.7 C) (Oral)  Resp 22  SpO2 95%  Physical Exam  Nursing note and vitals reviewed. Constitutional: He is oriented to person, place, and time. He appears well-developed and well-nourished.  HENT:  Head: Normocephalic and atraumatic.  Eyes: Conjunctivae and EOM are normal. Pupils are equal, round, and reactive to light.  Neck: Normal range of motion. Neck supple.  Cardiovascular: Normal rate, regular rhythm and normal heart sounds.   Pulmonary/Chest: Effort normal and breath sounds normal.  Abdominal: Soft. Bowel sounds are normal.  Musculoskeletal: He exhibits tenderness.  Tender to right posterior pelvis, right anterior femur, and right anterior knee  Neurological: He is alert and oriented to person, place, and time.  Skin: Skin is warm and dry.  Psychiatric: He has a normal mood and affect. His behavior is normal.    ED Course  Procedures (including critical care time)  DIAGNOSTIC STUDIES: Oxygen Saturation is 95% on room air, adequate by my interpretation.    COORDINATION OF CARE: 11:26 AM-Discussed treatment plan which includes x-rays of injured areas with pt at bedside and pt agreed to plan.    Dg Pelvis 1-2 Views  05/11/2013   CLINICAL DATA:  Right hip pain, pelvis pain  EXAM: PELVIS - 1-2 VIEW  COMPARISON:  None.  FINDINGS: No acute fracture or dislocation. Right total hip arthroplasty. Mild osteoarthritis of the left hip.  IMPRESSION: Right total hip arthroplasty without failure or complication. The distal aspect of the femoral stem component is excluded from the field of view.   Electronically Signed   By: Kathreen Devoid   On: 05/11/2013 12:27    Dg Femur Right  05/11/2013   CLINICAL DATA:  Fall.  Right hip pain.  EXAM: RIGHT FEMUR - 2 VIEW  COMPARISON:  03/02/2012  FINDINGS: No fracture or bone lesion.  The knee and hip prosthetic components are  well-seated and aligned. No evidence of loosening.  The soft tissues are unremarkable.  IMPRESSION: No fracture or dislocation.   Electronically Signed   By: Lajean Manes M.D.   On: 05/11/2013 12:20    Dg Knee Complete 4 Views Right  05/11/2013   CLINICAL DATA:  Fall.  Right knee pain.  EXAM: RIGHT KNEE - COMPLETE 4+ VIEW  COMPARISON:  08/05/2012  FINDINGS: No fracture or bone lesion. Knee prosthetic components are well-seated and aligned. No evidence of loosening.  No convincing joint effusion.  Soft tissues are unremarkable.  IMPRESSION: No fracture or acute finding.   Electronically Signed  By: Lajean Manes M.D.   On: 05/11/2013 12:21      MDM   Final diagnoses:  Fall  Right leg pain  Right knee pain  Pelvic contusion    No head or neck trauma. Plain films of right femur, right knee, pelvis all negative for fracture    I personally performed the services described in this documentation, which was scribed in my presence. The recorded information has been reviewed and is accurate.    Nat Christen, MD 05/11/13 1336

## 2013-05-11 NOTE — ED Notes (Signed)
Pt tripped and fell down one step 2 hrs pta. Pt has chronic back and knee pains with past surgeries. Pt c/o right side lower back pain radiating into right hip and right knee pain. Slight swelling noted to right knee.

## 2013-05-11 NOTE — Discharge Instructions (Signed)
X-rays show no fracture. Pain medicine. Followup with your orthopedic Dr. for continued pain

## 2013-06-02 ENCOUNTER — Emergency Department (HOSPITAL_COMMUNITY)
Admission: EM | Admit: 2013-06-02 | Discharge: 2013-06-02 | Disposition: A | Discharge status: Home / Self Care | Payer: Medicare Other | Attending: Emergency Medicine | Admitting: Emergency Medicine

## 2013-06-02 ENCOUNTER — Encounter (HOSPITAL_COMMUNITY): Payer: Self-pay | Admitting: Emergency Medicine

## 2013-06-02 DIAGNOSIS — T148XXA Other injury of unspecified body region, initial encounter: Secondary | ICD-10-CM | POA: Insufficient documentation

## 2013-06-02 DIAGNOSIS — F411 Generalized anxiety disorder: Secondary | ICD-10-CM | POA: Insufficient documentation

## 2013-06-02 DIAGNOSIS — F17200 Nicotine dependence, unspecified, uncomplicated: Secondary | ICD-10-CM | POA: Insufficient documentation

## 2013-06-02 DIAGNOSIS — Z8669 Personal history of other diseases of the nervous system and sense organs: Secondary | ICD-10-CM | POA: Insufficient documentation

## 2013-06-02 DIAGNOSIS — E1169 Type 2 diabetes mellitus with other specified complication: Secondary | ICD-10-CM | POA: Insufficient documentation

## 2013-06-02 DIAGNOSIS — Z8719 Personal history of other diseases of the digestive system: Secondary | ICD-10-CM | POA: Insufficient documentation

## 2013-06-02 DIAGNOSIS — Z8739 Personal history of other diseases of the musculoskeletal system and connective tissue: Secondary | ICD-10-CM | POA: Insufficient documentation

## 2013-06-02 DIAGNOSIS — S79919A Unspecified injury of unspecified hip, initial encounter: Secondary | ICD-10-CM | POA: Insufficient documentation

## 2013-06-02 DIAGNOSIS — W010XXA Fall on same level from slipping, tripping and stumbling without subsequent striking against object, initial encounter: Secondary | ICD-10-CM | POA: Insufficient documentation

## 2013-06-02 DIAGNOSIS — F329 Major depressive disorder, single episode, unspecified: Secondary | ICD-10-CM | POA: Insufficient documentation

## 2013-06-02 DIAGNOSIS — Z87828 Personal history of other (healed) physical injury and trauma: Secondary | ICD-10-CM | POA: Insufficient documentation

## 2013-06-02 DIAGNOSIS — Y929 Unspecified place or not applicable: Secondary | ICD-10-CM | POA: Insufficient documentation

## 2013-06-02 DIAGNOSIS — Y9389 Activity, other specified: Secondary | ICD-10-CM | POA: Insufficient documentation

## 2013-06-02 DIAGNOSIS — L97509 Non-pressure chronic ulcer of other part of unspecified foot with unspecified severity: Secondary | ICD-10-CM | POA: Insufficient documentation

## 2013-06-02 DIAGNOSIS — Z79899 Other long term (current) drug therapy: Secondary | ICD-10-CM | POA: Insufficient documentation

## 2013-06-02 DIAGNOSIS — IMO0002 Reserved for concepts with insufficient information to code with codable children: Secondary | ICD-10-CM | POA: Insufficient documentation

## 2013-06-02 DIAGNOSIS — Z8673 Personal history of transient ischemic attack (TIA), and cerebral infarction without residual deficits: Secondary | ICD-10-CM | POA: Insufficient documentation

## 2013-06-02 DIAGNOSIS — Z96659 Presence of unspecified artificial knee joint: Secondary | ICD-10-CM | POA: Insufficient documentation

## 2013-06-02 DIAGNOSIS — S8000XA Contusion of unspecified knee, initial encounter: Secondary | ICD-10-CM | POA: Insufficient documentation

## 2013-06-02 DIAGNOSIS — F3289 Other specified depressive episodes: Secondary | ICD-10-CM | POA: Insufficient documentation

## 2013-06-02 DIAGNOSIS — S79929A Unspecified injury of unspecified thigh, initial encounter: Secondary | ICD-10-CM

## 2013-06-02 DIAGNOSIS — Z8701 Personal history of pneumonia (recurrent): Secondary | ICD-10-CM | POA: Insufficient documentation

## 2013-06-02 MED ORDER — METHOCARBAMOL 500 MG PO TABS: 500.0000 mg | ORAL_TABLET | Freq: Three times a day (TID) | ORAL | Status: DC

## 2013-06-02 MED ORDER — CELECOXIB 100 MG PO CAPS: 100.0000 mg | ORAL_CAPSULE | Freq: Two times a day (BID) | ORAL | Status: DC

## 2013-06-02 NOTE — ED Notes (Addendum)
Fell app 2 hours pta when mowing. Pain rt hip , knee and ankle.  Alert, NAD .Marland Kitchen No swelling of ankle or knee.  Has had joint replacements of rt knee and rt hip.

## 2013-06-02 NOTE — ED Notes (Signed)
Pt c/o falling onto right side while mowing grass on hill. C/o pain to right hip/knee/ankle. Nad noted.

## 2013-06-02 NOTE — ED Provider Notes (Signed)
CSN: [id number]     Arrival date & time 06/02/13  1231 History   First MD Initiated Contact with Patient 06/02/13 1332     Chief Complaint  Patient presents with  . Fall     (Consider location/radiation/quality/duration/timing/severity/associated sxs/prior Treatment) HPI Comments: Patient is a 48 year old male who presents to the emergency department with the complaint of right hip, knee, ankle pain. The patient states he was pushing a lawnmower when he is knee went out on him and he stumbled and fell. It is of note that the patient has bilateral knee replacements. It is also of note that the patient has a right hip replacement. The patient has been able to ambulate since the fall. He has significant pain involving the lower back, he also complains of pain of the hip and knee on the right side. He denies hitting his head. He denies any chest injury, or difficulty with his breathing. The patient denies being on any anticoagulation medications.  Patient is a 48 y.o. male presenting with fall. The history is provided by the patient.  Fall This is a new problem. The current episode started today. Associated symptoms include arthralgias. Pertinent negatives include no abdominal pain, chest pain, coughing or neck pain.    Past Medical History  Diagnosis Date  . Diabetic foot ulcer   . ETOH abuse   . Anxiety   . Open wound     bottom of foot  . Diabetes mellitus without complication     borderline  . Neuromuscular disorder     neuropathy  . GERD (gastroesophageal reflux disease)     tums  . Pneumonia ~ 2012  . History of blood transfusion     "related to left knee OR; probably right hip too" (04/21/2012)  . Stroke 2008    "they said I might have had one during right hip replacement" (04/21/2012)  . Arthritis     "everywhere" (04/21/2012)  . Mental disorder   . Depression   . DDD (degenerative disc disease)    Past Surgical History  Procedure Laterality Date  . Total hip arthroplasty  Right 2008  . Total knee arthroplasty Left 2006  . Joint replacement    . Lung lobectomy Left ~ 2006  . Revision total hip arthroplasty Right 2008    "4-5 months after replacement" (04/21/2012)  . Knee arthroscopy Bilateral 1980's/1990's  . Lung lobectomy    . Metatarsal osteotomy  10/29/2011    Procedure: METATARSAL OSTEOTOMY;  Surgeon: [name], MD;  Location: [facility];  Service: Orthopedics;  Laterality: Left;  Left 1st Metatarsal Dorsal Closing Wedge   . Total knee arthroplasty Right 04/20/2012  . Total knee arthroplasty Right 04/20/2012    Procedure: TOTAL KNEE ARTHROPLASTY;  Surgeon: [name], MD;  Location: [facility];  Service: Orthopedics;  Laterality: Right;  Right Total Knee Arthroplasty   History reviewed. No pertinent family history. History  Substance Use Topics  . Smoking status: Current Every Day Smoker -- 1.50 packs/day for 30 years    Types: Cigarettes  . Smokeless tobacco: Never Used     Comment: 04/21/2012 offered smoking cessation materials; pt declines  . Alcohol Use: 0.0 oz/week     Comment: last used march 11    Review of Systems  Constitutional: Negative for activity change.       All ROS Neg except as noted in HPI  HENT: Negative for nosebleeds.   Eyes: Negative for photophobia and discharge.  Respiratory: Negative for cough, shortness  of breath and wheezing.   Cardiovascular: Negative for chest pain and palpitations.  Gastrointestinal: Negative for abdominal pain and blood in stool.  Genitourinary: Negative for dysuria, frequency and hematuria.  Musculoskeletal: Positive for arthralgias and back pain. Negative for neck pain.  Skin: Negative.   Neurological: Negative for dizziness, seizures and speech difficulty.  Psychiatric/Behavioral: Negative for hallucinations and confusion. The patient is nervous/anxious.       Allergies  Benadryl and Trazodone and nefazodone  Home Medications   Prior to Admission medications   Medication Sig Start Date End  Date Taking? Authorizing Provider  escitalopram (LEXAPRO) 20 MG tablet Take 1 tablet (20 mg total) by mouth every morning. For depression 03/29/13  Yes Encarnacion Slates, NP  gabapentin (NEURONTIN) 400 MG capsule Take 1 capsule (400 mg total) by mouth 4 (four) times daily. For substance withdrawal syndrome 03/29/13  Yes Encarnacion Slates, NP  mirtazapine (REMERON) 45 MG tablet Take 1 tablet (45 mg total) by mouth at bedtime. For depression/sleep 03/29/13  Yes Encarnacion Slates, NP  QUEtiapine (SEROQUEL) 200 MG tablet Take 1 tablet (200 mg total) by mouth at bedtime. For mood control 03/29/13  Yes Encarnacion Slates, NP   BP 135/74  Pulse 84  Temp(Src) 97.9 F (36.6 C)  Resp 18  Ht 6\' 3"  (1.905 m)  Wt 215 lb (97.523 kg)  BMI 26.87 kg/m2  SpO2 97% Physical Exam  Nursing note and vitals reviewed. Constitutional: He is oriented to person, place, and time. He appears well-developed and well-nourished.  Non-toxic appearance.  HENT:  Head: Normocephalic.  Right Ear: Tympanic membrane and external ear normal.  Left Ear: Tympanic membrane and external ear normal.  Eyes: EOM and lids are normal. Pupils are equal, round, and reactive to light.  Neck: Normal range of motion. Neck supple. Carotid bruit is not present.  Cardiovascular: Normal rate, regular rhythm, normal heart sounds, intact distal pulses and normal pulses.   Pulmonary/Chest: Breath sounds normal. No respiratory distress.  Abdominal: Soft. Bowel sounds are normal. There is no tenderness. There is no guarding.  Musculoskeletal: Normal range of motion.  There is lumbar paraspinal tenderness to palpation and with change of position on the right side.  The right hip appears to be intact. There is no shortening of the lower extremity. There is no deformity of the hip. There is soreness around the hip area,  no hematoma appreciated.  There is stiffness and soreness involving the right knee. There is no evidence of any dislocation. There is no deformity of  the tibia area. There is good range of motion of the right ankle. The Achilles tendon is intact. The dorsalis pedis pulses 2+. The capillary refill is less than 2 seconds.  Lymphadenopathy:       Head (right side): No submandibular adenopathy present.       Head (left side): No submandibular adenopathy present.    He has no cervical adenopathy.  Neurological: He is alert and oriented to person, place, and time. He has normal strength. No cranial nerve deficit or sensory deficit.  Gait is slow and steady but intact. No evidence of foot drop.  Skin: Skin is warm and dry.  Psychiatric: He has a normal mood and affect. His speech is normal.    ED Course  Procedures (including critical care time) Labs Review Labs Reviewed - No data to display  Imaging Review No results found.   EKG Interpretation None      MDM Patient sustained a fall  today. States that he thinks his knees may have given away, but he may have stepped in a level place while pushing a lawnmower. The vital signs are within normal limits. The pulse oximetry is 97% on room air. There is no palpable deformity of the chest wall, there is no palpable deformity of the right hip, knee, ankle. Suspect that the patient has a strain of the paraspinous area of the right lumbar region. Suspect that the patient has a contusion involving the right knee. Patient will be treated  Celebrexand Robaxin. Patient advised to see his orthopedic physician for additional evaluation and management at the beginning of next week. Patient is provided with an ice pack.   Final diagnoses:  None    **I have reviewed nursing notes, vital signs, and all appropriate lab and imaging results for this patient.Lenox Ahr, PA-C 06/02/13 1351

## 2013-06-02 NOTE — ED Notes (Signed)
Pt already has crutches

## 2013-06-02 NOTE — Discharge Instructions (Signed)
Please alternate heat and ice to your back and knee. Please use Celebrex 2 times daily, please use Robaxin 3 times daily. Robaxin may cause drowsiness, please use with caution. Please see Dr Sherrie Sport or Dr Sharol Given for additional evaluation and management. Muscle Strain A muscle strain is an injury that occurs when a muscle is stretched beyond its normal length. Usually a small number of muscle fibers are torn when this happens. Muscle strain is rated in degrees. First-degree strains have the least amount of muscle fiber tearing and pain. Second-degree and third-degree strains have increasingly more tearing and pain.  Usually, recovery from muscle strain takes 1 2 weeks. Complete healing takes 5 6 weeks.  CAUSES  Muscle strain happens when a sudden, violent force placed on a muscle stretches it too far. This may occur with lifting, sports, or a fall.  RISK FACTORS Muscle strain is especially common in athletes.  SIGNS AND SYMPTOMS At the site of the muscle strain, there may be:  Pain.  Bruising.  Swelling.  Difficulty using the muscle due to pain or lack of normal function. DIAGNOSIS  Your health care provider will perform a physical exam and ask about your medical history. TREATMENT  Often, the best treatment for a muscle strain is resting, icing, and applying cold compresses to the injured area.  HOME CARE INSTRUCTIONS   Use the PRICE method of treatment to promote muscle healing during the first 2 3 days after your injury. The PRICE method involves:  Protecting the muscle from being injured again.  Restricting your activity and resting the injured body part.  Icing your injury. To do this, put ice in a plastic bag. Place a towel between your skin and the bag. Then, apply the ice and leave it on from 15 20 minutes each hour. After the third day, switch to moist heat packs.  Apply compression to the injured area with a splint or elastic bandage. Be careful not to wrap it too tightly.  This may interfere with blood circulation or increase swelling.  Elevate the injured body part above the level of your heart as often as you can.  Only take over-the-counter or prescription medicines for pain, discomfort, or fever as directed by your health care provider.  Warming up prior to exercise helps to prevent future muscle strains. SEEK MEDICAL CARE IF:   You have increasing pain or swelling in the injured area.  You have numbness, tingling, or a significant loss of strength in the injured area. MAKE SURE YOU:   Understand these instructions.  Will watch your condition.  Will get help right away if you are not doing well or get worse. Document Released: 01/05/2005 Document Revised: 10/26/2012 Document Reviewed: 08/04/2012 [facility] Patient Information 2014 [place], [place].  Contusion A contusion is a deep bruise. Contusions happen when an injury causes bleeding under the skin. Signs of bruising include pain, puffiness (swelling), and discolored skin. The contusion may turn blue, purple, or yellow. HOME CARE   Put ice on the injured area.  Put ice in a plastic bag.  Place a towel between your skin and the bag.  Leave the ice on for 15-20 minutes, 03-04 times a day.  Only take medicine as told by your doctor.  Rest the injured area.  If possible, raise (elevate) the injured area to lessen puffiness. GET HELP RIGHT AWAY IF:   You have more bruising or puffiness.  You have pain that is getting worse.  Your puffiness or pain is not helped  by medicine. MAKE SURE YOU:   Understand these instructions.  Will watch your condition.  Will get help right away if you are not doing well or get worse. Document Released: 06/24/2007 Document Revised: 03/30/2011 Document Reviewed: 11/10/2010 [facility] Patient Information 2014 [place], [place].

## 2013-06-08 NOTE — ED Provider Notes (Signed)
Medical screening examination/treatment/procedure(s) were performed by non-physician practitioner and as supervising physician I was immediately available for consultation/collaboration.   EKG Interpretation None        Keng Jewel, MD 06/08/13 0036 

## 2013-06-30 ENCOUNTER — Emergency Department (HOSPITAL_COMMUNITY): Payer: Medicare Other

## 2013-06-30 ENCOUNTER — Emergency Department (HOSPITAL_COMMUNITY)
Admission: EM | Admit: 2013-06-30 | Discharge: 2013-06-30 | Disposition: A | Discharge status: Home / Self Care | Payer: Medicare Other | Attending: Emergency Medicine | Admitting: Emergency Medicine

## 2013-06-30 ENCOUNTER — Encounter (HOSPITAL_COMMUNITY): Payer: Self-pay | Admitting: Emergency Medicine

## 2013-06-30 DIAGNOSIS — S91309A Unspecified open wound, unspecified foot, initial encounter: Secondary | ICD-10-CM | POA: Insufficient documentation

## 2013-06-30 DIAGNOSIS — S8990XA Unspecified injury of unspecified lower leg, initial encounter: Secondary | ICD-10-CM | POA: Insufficient documentation

## 2013-06-30 DIAGNOSIS — Z96659 Presence of unspecified artificial knee joint: Secondary | ICD-10-CM | POA: Insufficient documentation

## 2013-06-30 DIAGNOSIS — F3289 Other specified depressive episodes: Secondary | ICD-10-CM | POA: Insufficient documentation

## 2013-06-30 DIAGNOSIS — F17200 Nicotine dependence, unspecified, uncomplicated: Secondary | ICD-10-CM | POA: Insufficient documentation

## 2013-06-30 DIAGNOSIS — Z9889 Other specified postprocedural states: Secondary | ICD-10-CM | POA: Insufficient documentation

## 2013-06-30 DIAGNOSIS — Z8719 Personal history of other diseases of the digestive system: Secondary | ICD-10-CM | POA: Insufficient documentation

## 2013-06-30 DIAGNOSIS — Y929 Unspecified place or not applicable: Secondary | ICD-10-CM | POA: Insufficient documentation

## 2013-06-30 DIAGNOSIS — S79919A Unspecified injury of unspecified hip, initial encounter: Secondary | ICD-10-CM | POA: Insufficient documentation

## 2013-06-30 DIAGNOSIS — Z8701 Personal history of pneumonia (recurrent): Secondary | ICD-10-CM | POA: Insufficient documentation

## 2013-06-30 DIAGNOSIS — F411 Generalized anxiety disorder: Secondary | ICD-10-CM | POA: Insufficient documentation

## 2013-06-30 DIAGNOSIS — W108XXA Fall (on) (from) other stairs and steps, initial encounter: Secondary | ICD-10-CM | POA: Insufficient documentation

## 2013-06-30 DIAGNOSIS — Z8669 Personal history of other diseases of the nervous system and sense organs: Secondary | ICD-10-CM | POA: Insufficient documentation

## 2013-06-30 DIAGNOSIS — Z79899 Other long term (current) drug therapy: Secondary | ICD-10-CM | POA: Insufficient documentation

## 2013-06-30 DIAGNOSIS — Z87828 Personal history of other (healed) physical injury and trauma: Secondary | ICD-10-CM | POA: Insufficient documentation

## 2013-06-30 DIAGNOSIS — R52 Pain, unspecified: Secondary | ICD-10-CM

## 2013-06-30 DIAGNOSIS — Z872 Personal history of diseases of the skin and subcutaneous tissue: Secondary | ICD-10-CM | POA: Insufficient documentation

## 2013-06-30 DIAGNOSIS — Z8739 Personal history of other diseases of the musculoskeletal system and connective tissue: Secondary | ICD-10-CM | POA: Insufficient documentation

## 2013-06-30 DIAGNOSIS — S79929A Unspecified injury of unspecified thigh, initial encounter: Principal | ICD-10-CM

## 2013-06-30 DIAGNOSIS — Z96649 Presence of unspecified artificial hip joint: Secondary | ICD-10-CM | POA: Insufficient documentation

## 2013-06-30 DIAGNOSIS — S99929A Unspecified injury of unspecified foot, initial encounter: Secondary | ICD-10-CM

## 2013-06-30 DIAGNOSIS — S99919A Unspecified injury of unspecified ankle, initial encounter: Secondary | ICD-10-CM

## 2013-06-30 DIAGNOSIS — Z8673 Personal history of transient ischemic attack (TIA), and cerebral infarction without residual deficits: Secondary | ICD-10-CM | POA: Insufficient documentation

## 2013-06-30 DIAGNOSIS — E119 Type 2 diabetes mellitus without complications: Secondary | ICD-10-CM | POA: Insufficient documentation

## 2013-06-30 DIAGNOSIS — Z791 Long term (current) use of non-steroidal anti-inflammatories (NSAID): Secondary | ICD-10-CM | POA: Insufficient documentation

## 2013-06-30 DIAGNOSIS — Y9389 Activity, other specified: Secondary | ICD-10-CM | POA: Insufficient documentation

## 2013-06-30 DIAGNOSIS — F329 Major depressive disorder, single episode, unspecified: Secondary | ICD-10-CM | POA: Insufficient documentation

## 2013-06-30 MED ORDER — ACETAMINOPHEN-CODEINE #3 300-30 MG PO TABS: 1.0000 | ORAL_TABLET | Freq: Four times a day (QID) | ORAL | Status: DC | PRN

## 2013-06-30 MED ORDER — ACETAMINOPHEN-CODEINE #3 300-30 MG PO TABS
2.0000 | ORAL_TABLET | Freq: Once | ORAL | Status: AC
  Administered 2013-06-30: 2 via ORAL
  Filled 2013-06-30: qty 2

## 2013-06-30 MED ORDER — ONDANSETRON HCL 4 MG PO TABS
4.0000 mg | ORAL_TABLET | Freq: Once | ORAL | Status: AC
  Administered 2013-06-30: 4 mg via ORAL
  Filled 2013-06-30: qty 1

## 2013-06-30 MED ORDER — IBUPROFEN 800 MG PO TABS
800.0000 mg | ORAL_TABLET | Freq: Once | ORAL | Status: AC
  Administered 2013-06-30: 800 mg via ORAL
  Filled 2013-06-30: qty 1

## 2013-06-30 MED ORDER — DIAZEPAM 5 MG PO TABS
5.0000 mg | ORAL_TABLET | Freq: Once | ORAL | Status: AC
  Administered 2013-06-30: 5 mg via ORAL
  Filled 2013-06-30: qty 1

## 2013-06-30 NOTE — ED Provider Notes (Signed)
CSN: [id number]     Arrival date & time 06/30/13  1450 History   First MD Initiated Contact with Patient 06/30/13 1551     Chief Complaint  Patient presents with  . Fall     (Consider location/radiation/quality/duration/timing/severity/associated sxs/prior Treatment) Patient is a 48 y.o. male presenting with fall. The history is provided by the patient.  Fall This is a new problem. The current episode started today. The problem occurs constantly. The problem has been gradually worsening. Associated symptoms include arthralgias. Pertinent negatives include no abdominal pain, chest pain, coughing or neck pain. The symptoms are aggravated by standing and walking. He has tried nothing for the symptoms. The treatment provided no relief.    Past Medical History  Diagnosis Date  . Diabetic foot ulcer   . ETOH abuse   . Anxiety   . Open wound     bottom of foot  . Diabetes mellitus without complication     borderline  . Neuromuscular disorder     neuropathy  . GERD (gastroesophageal reflux disease)     tums  . Pneumonia ~ 2012  . History of blood transfusion     "related to left knee OR; probably right hip too" (04/21/2012)  . Stroke 2008    "they said I might have had one during right hip replacement" (04/21/2012)  . Arthritis     "everywhere" (04/21/2012)  . Mental disorder   . Depression   . DDD (degenerative disc disease)    Past Surgical History  Procedure Laterality Date  . Total hip arthroplasty Right 2008  . Total knee arthroplasty Left 2006  . Joint replacement    . Lung lobectomy Left ~ 2006  . Revision total hip arthroplasty Right 2008    "4-5 months after replacement" (04/21/2012)  . Knee arthroscopy Bilateral 1980's/1990's  . Lung lobectomy    . Metatarsal osteotomy  10/29/2011    Procedure: METATARSAL OSTEOTOMY;  Surgeon: [name], MD;  Location: [place];  Service: Orthopedics;  Laterality: Left;  Left 1st Metatarsal Dorsal Closing Wedge   . Total knee arthroplasty  Right 04/20/2012  . Total knee arthroplasty Right 04/20/2012    Procedure: TOTAL KNEE ARTHROPLASTY;  Surgeon: [name], MD;  Location: [place];  Service: Orthopedics;  Laterality: Right;  Right Total Knee Arthroplasty   History reviewed. No pertinent family history. History  Substance Use Topics  . Smoking status: Current Every Day Smoker -- 1.50 packs/day for 30 years    Types: Cigarettes  . Smokeless tobacco: Never Used     Comment: 04/21/2012 offered smoking cessation materials; pt declines  . Alcohol Use: 0.0 oz/week     Comment: last used march 11    Review of Systems  Constitutional: Negative for activity change.       All ROS Neg except as noted in HPI  HENT: Negative for nosebleeds.   Eyes: Negative for photophobia and discharge.  Respiratory: Negative for cough, shortness of breath and wheezing.   Cardiovascular: Negative for chest pain and palpitations.  Gastrointestinal: Negative for abdominal pain and blood in stool.  Genitourinary: Negative for dysuria, frequency and hematuria.  Musculoskeletal: Positive for arthralgias. Negative for back pain and neck pain.  Skin: Positive for wound.  Neurological: Negative for dizziness, seizures and speech difficulty.  Psychiatric/Behavioral: Negative for hallucinations and confusion. The patient is nervous/anxious.       Allergies  Benadryl and Trazodone and nefazodone  Home Medications   Prior to Admission medications   Medication  Sig Start Date End Date Taking? Authorizing Provider  celecoxib (CELEBREX) 100 MG capsule Take 1 capsule (100 mg total) by mouth 2 (two) times daily. 06/02/13   Lenox Ahr, PA-C  escitalopram (LEXAPRO) 20 MG tablet Take 1 tablet (20 mg total) by mouth every morning. For depression 03/29/13   Encarnacion Slates, NP  gabapentin (NEURONTIN) 400 MG capsule Take 1 capsule (400 mg total) by mouth 4 (four) times daily. For substance withdrawal syndrome 03/29/13   Encarnacion Slates, NP  methocarbamol (ROBAXIN) 500  MG tablet Take 1 tablet (500 mg total) by mouth 3 (three) times daily. 06/02/13   Lenox Ahr, PA-C  mirtazapine (REMERON) 45 MG tablet Take 1 tablet (45 mg total) by mouth at bedtime. For depression/sleep 03/29/13   Encarnacion Slates, NP  QUEtiapine (SEROQUEL) 200 MG tablet Take 1 tablet (200 mg total) by mouth at bedtime. For mood control 03/29/13   Encarnacion Slates, NP   BP 134/82  Pulse 85  Temp(Src) 98 F (36.7 C) (Oral)  Resp 18  Ht 6\' 3"  (1.905 m)  Wt 220 lb (99.791 kg)  BMI 27.50 kg/m2  SpO2 97% Physical Exam  Nursing note and vitals reviewed. Constitutional: He is oriented to person, place, and time. He appears well-developed and well-nourished.  Non-toxic appearance.  HENT:  Head: Normocephalic.  Right Ear: Tympanic membrane and external ear normal.  Left Ear: Tympanic membrane and external ear normal.  Eyes: EOM and lids are normal. Pupils are equal, round, and reactive to light.  Neck: Normal range of motion. Neck supple. Carotid bruit is not present.  Cardiovascular: Normal rate, regular rhythm, normal heart sounds, intact distal pulses and normal pulses.   Pulmonary/Chest: Breath sounds normal. No respiratory distress.  Abdominal: Soft. Bowel sounds are normal. There is no tenderness. There is no guarding.  Musculoskeletal:       Right hip: He exhibits tenderness. He exhibits normal range of motion, normal strength and no deformity.       Right knee: He exhibits decreased range of motion and swelling. He exhibits no deformity. Tenderness found.       Left ankle: He exhibits no swelling and no deformity. Tenderness.  There is an open lesion on the plantar surface of the left foot, just under the first Metatarsal Head. No red streaking.  Lymphadenopathy:       Head (right side): No submandibular adenopathy present.       Head (left side): No submandibular adenopathy present.    He has no cervical adenopathy.  Neurological: He is alert and oriented to person, place, and time.  He has normal strength. No cranial nerve deficit or sensory deficit.  Skin: Skin is warm and dry.  Psychiatric: He has a normal mood and affect. His speech is normal.    ED Course  Procedures (including critical care time) Labs Review Labs Reviewed - No data to display  Imaging Review No results found.   EKG Interpretation None      MDM Patient states that he was coming down steps when he dropped his cane and then fell approximately 4 steps. X-ray of the right hip is negative for fracture or dislocation. The patient has had a prior right hip replacement. X-ray of the right knee shows no acute findings. There is a right knee total arthroplasty present. X-ray of the right foot shows an orthopedic screw extending from the first metatarsal into the region of the middle cuneiform bone there is mild degenerative changes  present but no acute fracture or findings.  I have given the patient the results of his x-ray evaluation. I've advised patient to see his primary physician or his orthopedic specialist for additional evaluation and management. Prescription for Tylenol codein given to the patient.    Final diagnoses:  None    *I have reviewed nursing notes, vital signs, and all appropriate lab and imaging results for this patient.Lenox Ahr, PA-C 07/01/13 [phone]

## 2013-06-30 NOTE — ED Notes (Signed)
Patient was going down steps when he dropped his cane.  When reaching down to grab cane, he lost balance, falling onto R arm and hip. R hip and paraspinous tenderness to palpation.  C/O medial and lateral R knee pain.

## 2013-06-30 NOTE — Discharge Instructions (Signed)
Your x-rays are negative for fracture or dislocation. X-ray of your foot is negative for any gas or signs of major infection. Please use Tylenol or ibuprofen for mild pain, use Tylenol codeine for more severe pain. This medication may cause drowsiness, please use with caution. Please see Dr. Sherrie Sport for assistance with her pain management.

## 2013-06-30 NOTE — ED Notes (Signed)
Fell when coming down steps. Has dropped his cane and fell down app 4 steps   Pain rt   hip, rt thigh, rt knee, rt ankle and rt foot.  Lt ankle pain

## 2013-06-30 NOTE — ED Notes (Signed)
Patient with no complaints at this time. Respirations even and unlabored. Skin warm/dry. Discharge instructions reviewed with patient at this time. Patient given opportunity to voice concerns/ask questions. Patient discharged at this time and left Emergency Department with steady gait.   

## 2013-07-04 NOTE — ED Provider Notes (Signed)
Medical screening examination/treatment/procedure(s) were performed by non-physician practitioner and as supervising physician I was immediately available for consultation/collaboration.   EKG Interpretation None        Orpah Greek, MD 07/04/13 [phone]

## 2013-07-07 IMAGING — MR MR FOOT*L* WO/W CM
4 of 8 series · 19 of 40 positions shown · IV contrast (multihance)
Comparison: None

CLINICAL DATA: Plantar foot ulcer.

MRI OF THE LEFT FOREFOOT WITHOUT AND WITH CONTRAST
TECHNIQUE: Multiplanar, multisequence MR imaging was performed
both before and after administration of intravenous contrast.
Contrast: 15mL MULTIHANCE GADOBENATE DIMEGLUMINE 529 MG/ML IV SOLN

[Series 3: T1 · coronal · 4.0mm · 0.31mm/px · 6 of 34 slices shown (1 of 2)]
[im 1/34]
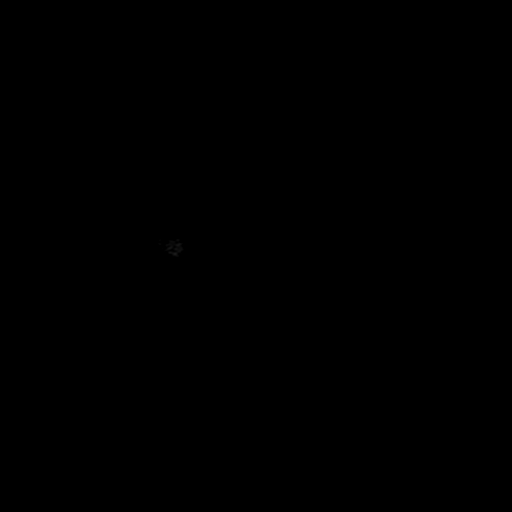
[im 7/34]
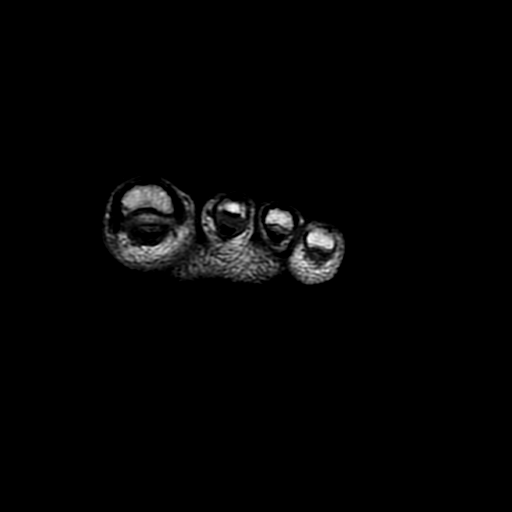
[im 14/34]
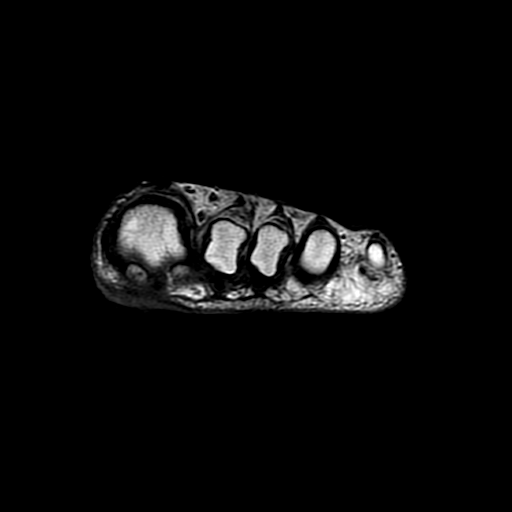
[im 20/34]
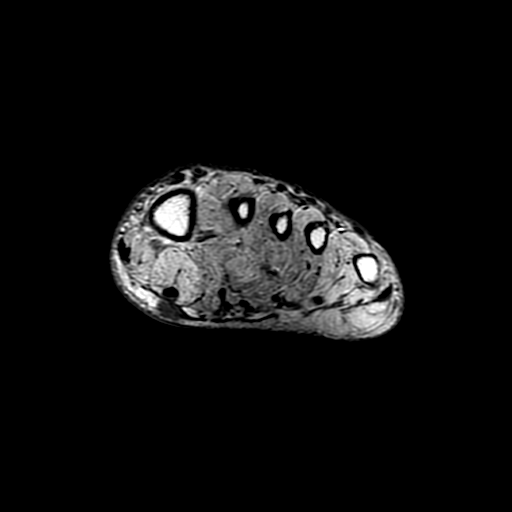
[im 27/34]
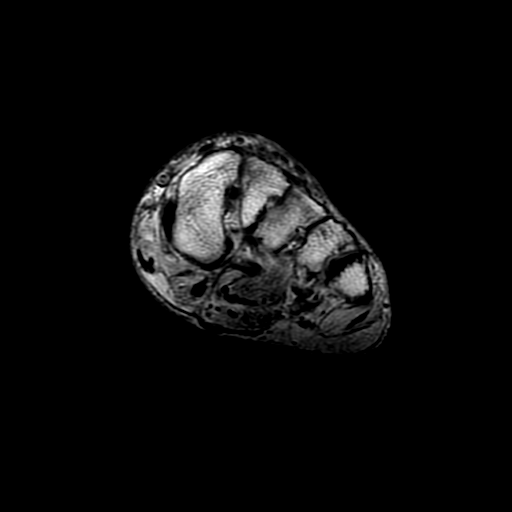
[im 34/34]
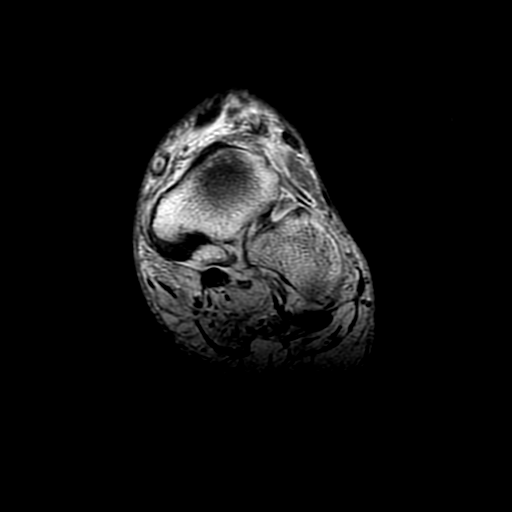

[Series 6: T1 · axial · 4.0mm · 0.33mm/px · z∈[-84,-25]mm · 3 of 13 slices shown (2 of 2)]
[im 1/13]
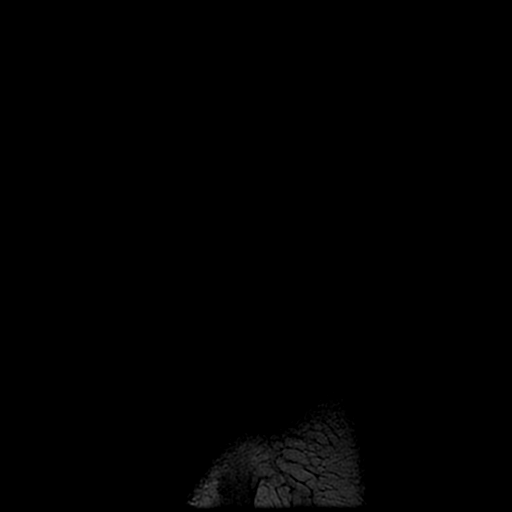
[im 7/13]
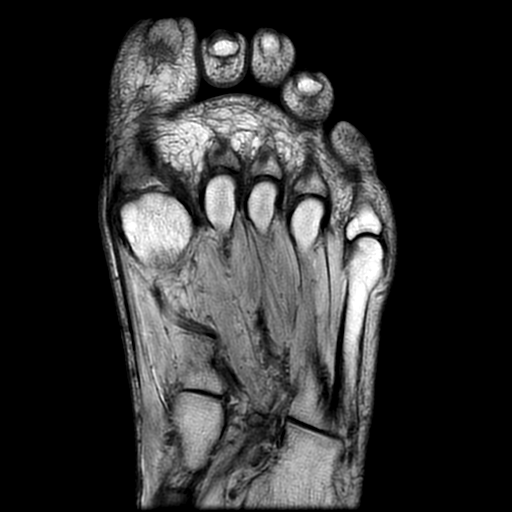
[im 13/13]
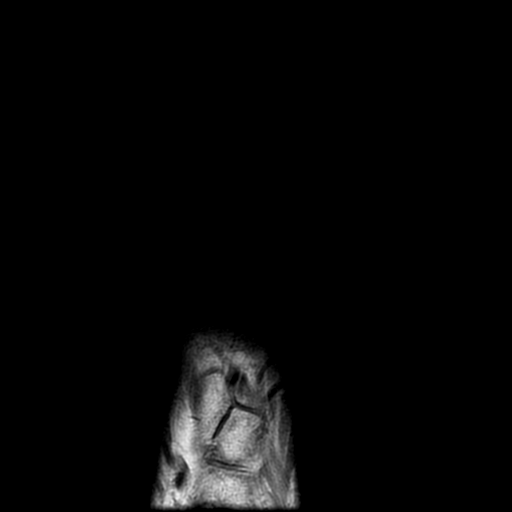

[Series 9: T1 post-contrast · coronal · 4.0mm · 0.31mm/px · 7 of 34 slices shown (1 of 2)]
[im 1/34]
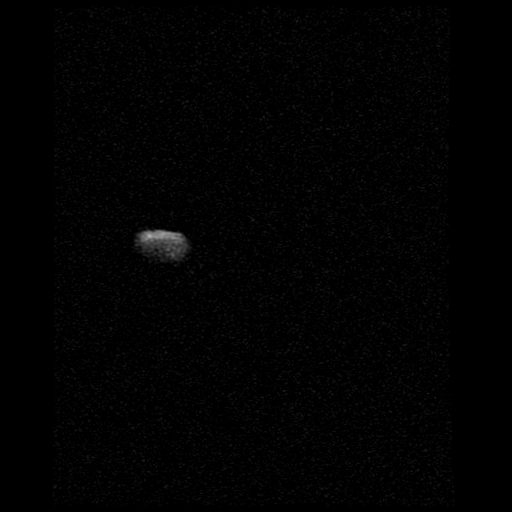
[im 6/34]
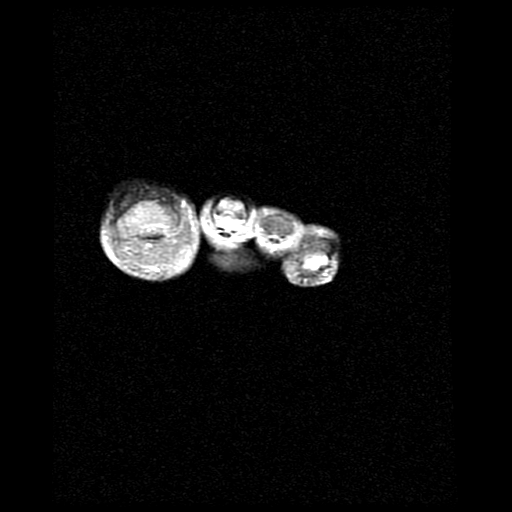
[im 12/34]
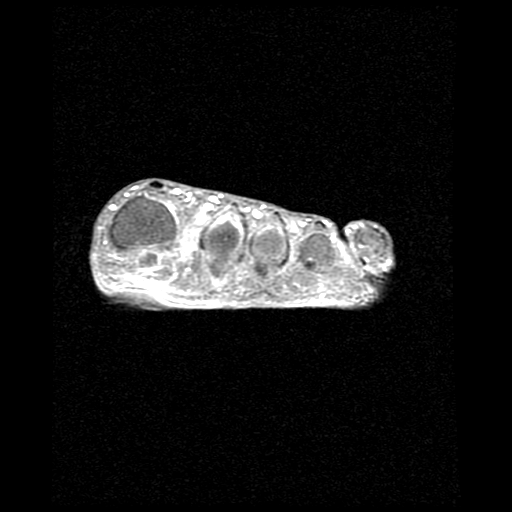
[im 17/34]
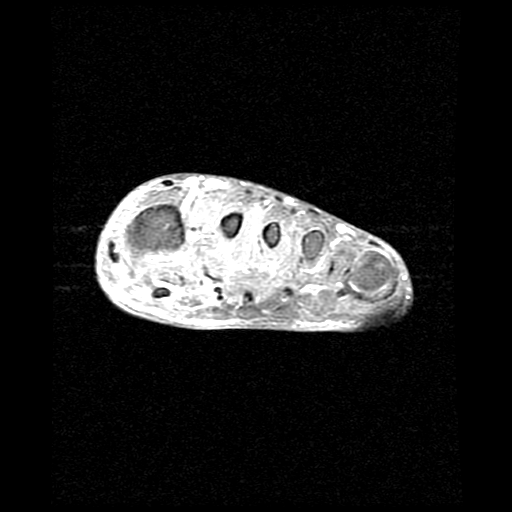
[im 23/34]
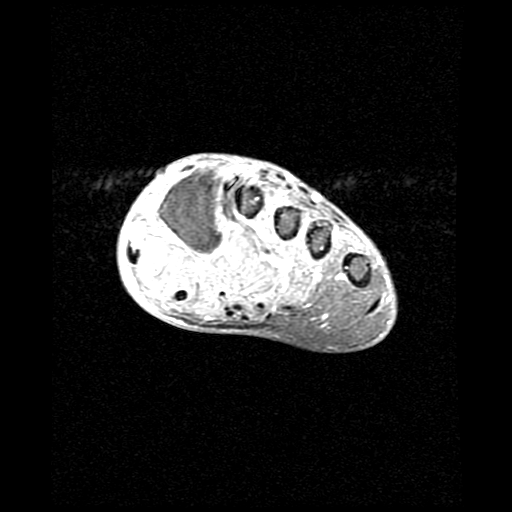
[im 28/34]
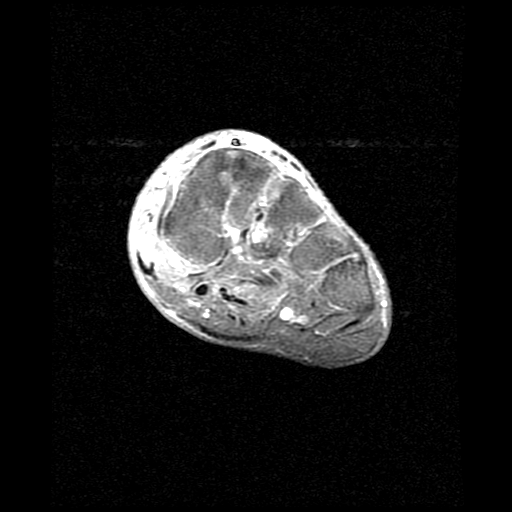
[im 34/34]
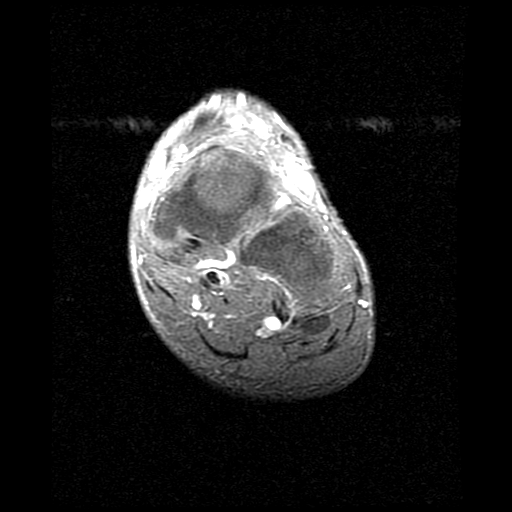

[Series 10: T1 post-contrast · axial · 4.0mm · 0.33mm/px · z∈[-85,-26]mm · 3 of 13 slices shown (2 of 2)]
[im 1/13]
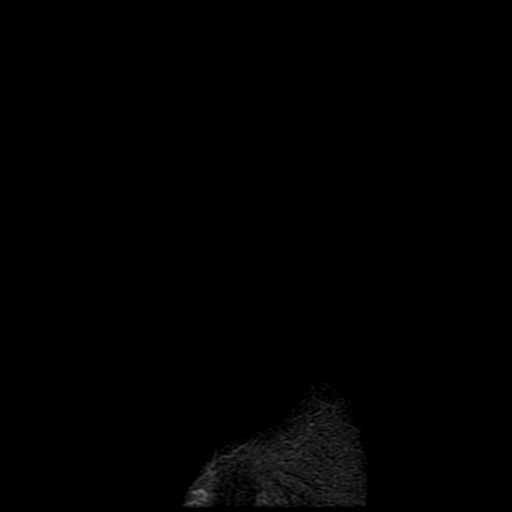
[im 7/13]
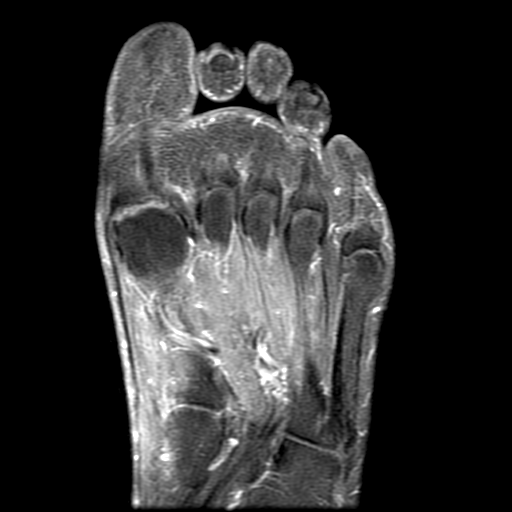
[im 13/13]
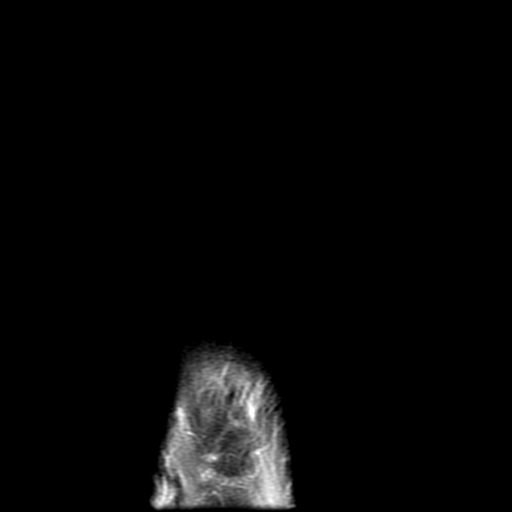

[19 of 40 positions shown; findings below may reference images not displayed]

FINDINGS: There is a small ulceration on the plantar aspect of the
forefoot overlying the region of the first metatarsal head.  No
discrete soft tissue abscess.  Mild surrounding cellulitis.  The
medial forefoot and midfoot musculature demonstrates diffuse fatty
infiltration and diffuse increased T2 signal intensity and
enhancement suggesting myositis.  No focal soft tissue abscess.  No
findings to suggest osteomyelitis.  The flexor and extensor tendons
are intact.  No findings for septic tenosynovitis.
IMPRESSION: 1.  No MR findings to suggest osteomyelitis or septic arthritis and
no soft tissue abscess.
2.  Plantar forefoot ulceration with mild surrounding cellulitis.
3.  Diffuse fatty infiltration of the foot musculature and
myositis.

## 2013-07-12 ENCOUNTER — Encounter (HOSPITAL_COMMUNITY): Payer: Self-pay | Admitting: Emergency Medicine

## 2013-07-12 ENCOUNTER — Emergency Department (HOSPITAL_COMMUNITY): Payer: Medicare Other

## 2013-07-12 ENCOUNTER — Emergency Department (HOSPITAL_COMMUNITY)
Admission: EM | Admit: 2013-07-12 | Discharge: 2013-07-12 | Disposition: A | Discharge status: Home / Self Care | Payer: Medicare Other | Attending: Emergency Medicine | Admitting: Emergency Medicine

## 2013-07-12 DIAGNOSIS — F3289 Other specified depressive episodes: Secondary | ICD-10-CM | POA: Insufficient documentation

## 2013-07-12 DIAGNOSIS — Z8673 Personal history of transient ischemic attack (TIA), and cerebral infarction without residual deficits: Secondary | ICD-10-CM | POA: Insufficient documentation

## 2013-07-12 DIAGNOSIS — F17200 Nicotine dependence, unspecified, uncomplicated: Secondary | ICD-10-CM | POA: Insufficient documentation

## 2013-07-12 DIAGNOSIS — M129 Arthropathy, unspecified: Secondary | ICD-10-CM | POA: Insufficient documentation

## 2013-07-12 DIAGNOSIS — S92919A Unspecified fracture of unspecified toe(s), initial encounter for closed fracture: Secondary | ICD-10-CM | POA: Insufficient documentation

## 2013-07-12 DIAGNOSIS — Y9389 Activity, other specified: Secondary | ICD-10-CM | POA: Insufficient documentation

## 2013-07-12 DIAGNOSIS — Z872 Personal history of diseases of the skin and subcutaneous tissue: Secondary | ICD-10-CM | POA: Insufficient documentation

## 2013-07-12 DIAGNOSIS — S92911A Unspecified fracture of right toe(s), initial encounter for closed fracture: Secondary | ICD-10-CM

## 2013-07-12 DIAGNOSIS — F329 Major depressive disorder, single episode, unspecified: Secondary | ICD-10-CM | POA: Insufficient documentation

## 2013-07-12 DIAGNOSIS — E119 Type 2 diabetes mellitus without complications: Secondary | ICD-10-CM | POA: Insufficient documentation

## 2013-07-12 DIAGNOSIS — F411 Generalized anxiety disorder: Secondary | ICD-10-CM | POA: Insufficient documentation

## 2013-07-12 DIAGNOSIS — Z8669 Personal history of other diseases of the nervous system and sense organs: Secondary | ICD-10-CM | POA: Insufficient documentation

## 2013-07-12 DIAGNOSIS — Z8701 Personal history of pneumonia (recurrent): Secondary | ICD-10-CM | POA: Insufficient documentation

## 2013-07-12 DIAGNOSIS — Z87828 Personal history of other (healed) physical injury and trauma: Secondary | ICD-10-CM | POA: Insufficient documentation

## 2013-07-12 DIAGNOSIS — Z79899 Other long term (current) drug therapy: Secondary | ICD-10-CM | POA: Insufficient documentation

## 2013-07-12 DIAGNOSIS — Z96659 Presence of unspecified artificial knee joint: Secondary | ICD-10-CM | POA: Insufficient documentation

## 2013-07-12 DIAGNOSIS — Y929 Unspecified place or not applicable: Secondary | ICD-10-CM | POA: Insufficient documentation

## 2013-07-12 DIAGNOSIS — W2203XA Walked into furniture, initial encounter: Secondary | ICD-10-CM | POA: Insufficient documentation

## 2013-07-12 MED ORDER — OXYCODONE-ACETAMINOPHEN 5-325 MG PO TABS: 1.0000 | ORAL_TABLET | ORAL | Status: DC | PRN

## 2013-07-12 MED ORDER — IBUPROFEN 600 MG PO TABS: 600.0000 mg | ORAL_TABLET | Freq: Four times a day (QID) | ORAL | Status: DC | PRN

## 2013-07-12 NOTE — Discharge Instructions (Signed)
Buddy Taping of Toes We have taped your toes together to keep them from moving. This is called "buddy taping" since we used a part of your own body to keep the injured part still. We placed soft padding between your toes to keep them from rubbing against each other. Buddy taping will help with healing and to reduce pain. Keep your toes buddy taped together for as long as directed by your caregiver. HOME CARE INSTRUCTIONS   Raise your injured area above the level of your heart while sitting or lying down. Prop it up with pillows.  An ice pack used every twenty minutes, while awake, for the first one to two days may be helpful. Put ice in a plastic bag and put a towel between the bag and your skin.  Watch for signs that the taping is too tight. These signs may be:  Numbness of your taped toes.  Coolness of your taped toes.  Color change in the area beyond the tape.  Increased pain.  If you have any of these signs, loosen or rewrap the tape. If you need to loosen or rewrap the buddy tape, make sure you use the padding again. SEEK IMMEDIATE MEDICAL CARE IF:   You have worse pain, swelling, inflammation (soreness), drainage or bleeding after you rewrap the tape.  Any new problems occur. MAKE SURE YOU:   Understand these instructions.  Will watch your condition.  Will get help right away if you are not doing well or get worse. Document Released: 10/10/2003 Document Revised: 03/30/2011 Document Reviewed: 01/03/2008 [facility] Patient Information 2015 [place], [place]. This information is not intended to replace advice given to you by your health care provider. Make sure you discuss any questions you have with your health care provider.  Toe Fracture Your caregiver has diagnosed you as having a fractured toe. A toe fracture is a break in the bone of a toe. "Buddy taping" is a way of splinting your broken toe, by taping the broken toe to the toe next to it. This "buddy taping" will keep the  injured toe from moving beyond normal range of motion. Buddy taping also helps the toe heal in a more normal alignment. It may take 6 to 8 weeks for the toe injury to heal. [place] your toes taped together for as long as directed by your caregiver or until you see a doctor for a follow-up examination. You can change the tape after bathing. Always use a small piece of gauze or cotton between the toes when taping them together. This will help the skin stay dry and prevent infection.  Apply ice to the injury for 15-20 minutes each hour while awake for the first 2 days. Put the ice in a plastic bag and place a towel between the bag of ice and your skin.  After the first 2 days, apply heat to the injured area. Use heat for the next 2 to 3 days. Place a heating pad on the foot or soak the foot in warm water as directed by your caregiver.  Keep your foot elevated as much as possible to lessen swelling.  Wear sturdy, supportive shoes. The shoes should not pinch the toes or fit tightly against the toes.  Your caregiver may prescribe a rigid shoe if your foot is very swollen.  Your may be given crutches if the pain is too great and it hurts too much to walk.  Only take over-the-counter or prescription medicines for pain, discomfort,  or fever as directed by your caregiver.  If your caregiver has given you a follow-up appointment, it is very important to keep that appointment. Not keeping the appointment could result in a chronic or permanent injury, pain, and disability. If there is any problem keeping the appointment, you must call back to this facility for assistance. SEEK MEDICAL CARE IF:   You have increased pain or swelling, not relieved with medications.  The pain does not get better after 1 week.  Your injured toe is cold when the others are warm. SEEK IMMEDIATE MEDICAL CARE IF:   The toe becomes cold, numb, or white.  The toe becomes hot (inflamed) and  red. Document Released: 01/03/2000 Document Revised: 03/30/2011 Document Reviewed: 08/22/2007 [facility] Patient Information 2015 [place], [place]. This information is not intended to replace advice given to you by your health care provider. Make sure you discuss any questions you have with your health care provider.   Ice and elevate as much as possible over the next several days.  You may try adding heat on Saturday.  Call Dr. Sharol Given as discussed to inform him of your new injury.  Wear the post op shoe and keep your toes buddy taped as we did today to protect the toe as it heals.  You may take the oxycodone prescribed for pain relief.  This will make you drowsy - do not drive within 4 hours of taking this medication.

## 2013-07-12 NOTE — ED Notes (Signed)
Pt with right foot pain and bruising noted since pt kicked coffee table last night, pt states he is able to ambulate on foot

## 2013-07-12 NOTE — ED Notes (Signed)
Pt reports to the ED after bumping right foot against a coffee table in the dark. Pt complains of right foot swelling and bruised toes.

## 2013-07-14 NOTE — ED Provider Notes (Signed)
CSN: [id number]     Arrival date & time 07/12/13  1409 History   First MD Initiated Contact with Patient 07/12/13 1507     Chief Complaint  Patient presents with  . Foot Pain     (Consider location/radiation/quality/duration/timing/severity/associated sxs/prior Treatment) The history is provided by the patient.    [name] is a 48 y.o. male presenting with pain, swelling and bruising due to injury sustained when he accidentally kicked his coffee table late last night.  He has continued pain which is worse with weight bearing, palpation and attempts to flex the toes.  He has used ice and elevation with minimal improvement in the pain.  He does have a history of diabetes and has been treated for a diabetic foot ulcer in the recent past.  He has no wounds at the site of his new injury.     Past Medical History  Diagnosis Date  . Diabetic foot ulcer   . ETOH abuse   . Anxiety   . Open wound     bottom of foot  . Diabetes mellitus without complication     borderline  . Neuromuscular disorder     neuropathy  . GERD (gastroesophageal reflux disease)     tums  . Pneumonia ~ 2012  . History of blood transfusion     "related to left knee OR; probably right hip too" (04/21/2012)  . Stroke 2008    "they said I might have had one during right hip replacement" (04/21/2012)  . Arthritis     "everywhere" (04/21/2012)  . Mental disorder   . Depression   . DDD (degenerative disc disease)    Past Surgical History  Procedure Laterality Date  . Total hip arthroplasty Right 2008  . Total knee arthroplasty Left 2006  . Joint replacement    . Lung lobectomy Left ~ 2006  . Revision total hip arthroplasty Right 2008    "4-5 months after replacement" (04/21/2012)  . Knee arthroscopy Bilateral 1980's/1990's  . Lung lobectomy    . Metatarsal osteotomy  10/29/2011    Procedure: METATARSAL OSTEOTOMY;  Surgeon: [name], MD;  Location: [place];  Service: Orthopedics;  Laterality: Left;  Left 1st  Metatarsal Dorsal Closing Wedge   . Total knee arthroplasty Right 04/20/2012  . Total knee arthroplasty Right 04/20/2012    Procedure: TOTAL KNEE ARTHROPLASTY;  Surgeon: [name], MD;  Location: [place];  Service: Orthopedics;  Laterality: Right;  Right Total Knee Arthroplasty   History reviewed. No pertinent family history. History  Substance Use Topics  . Smoking status: Current Every Day Smoker -- 1.00 packs/day for 30 years    Types: Cigarettes  . Smokeless tobacco: Never Used     Comment: 04/21/2012 offered smoking cessation materials; pt declines  . Alcohol Use: 0.0 oz/week     Comment: last used march 11    Review of Systems  Constitutional: Negative for fever.  Musculoskeletal: Positive for arthralgias and joint swelling. Negative for myalgias.  Skin: Positive for color change. Negative for wound.  Neurological: Negative for weakness and numbness.      Allergies  Benadryl and Trazodone and nefazodone  Home Medications   Prior to Admission medications   Medication Sig Start Date End Date Taking? Authorizing Provider  escitalopram (LEXAPRO) 20 MG tablet Take 1 tablet (20 mg total) by mouth every morning. For depression 03/29/13  Yes Encarnacion Slates, NP  gabapentin (NEURONTIN) 400 MG capsule Take 1 capsule (400 mg total) by mouth  4 (four) times daily. For substance withdrawal syndrome 03/29/13  Yes Encarnacion Slates, NP  mirtazapine (REMERON) 30 MG tablet Take 30 mg by mouth at bedtime.   Yes Historical Provider, MD  QUEtiapine (SEROQUEL) 200 MG tablet Take 1 tablet (200 mg total) by mouth at bedtime. For mood control 03/29/13  Yes Encarnacion Slates, NP  ibuprofen (ADVIL,MOTRIN) 600 MG tablet Take 1 tablet (600 mg total) by mouth every 6 (six) hours as needed for moderate pain. 07/12/13   Evalee Jefferson, PA-C  oxyCODONE-acetaminophen (PERCOCET/ROXICET) 5-325 MG per tablet Take 1 tablet by mouth every 4 (four) hours as needed. 07/12/13   Evalee Jefferson, PA-C   BP 122/82  Pulse 89  Temp(Src) 98.5  F (36.9 C) (Oral)  Resp 16  Ht 6\' 3"  (1.905 m)  Wt 220 lb (99.791 kg)  BMI 27.50 kg/m2  SpO2 97% Physical Exam  Constitutional: He appears well-developed and well-nourished.  HENT:  Head: Atraumatic.  Neck: Normal range of motion.  Cardiovascular:  Pulses equal bilaterally  Musculoskeletal: He exhibits edema and tenderness.       Feet:  Less than 3 sec cap refill in all digits of right foot. No subungual hematomas.  Skin intact.  Nails appear intact.  Neurological: He is alert. He has normal strength. He displays normal reflexes. A sensory deficit is present.  Decreased sensation to fine touch of third toe.  He is able to move the toe, but with discomfort.    Skin: Skin is warm and dry.  Psychiatric: He has a normal mood and affect.    ED Course  Procedures (including critical care time) Labs Review Labs Reviewed - No data to display  Imaging Review Dg Foot Complete Right  07/12/2013   CLINICAL DATA:  Right foot pain  EXAM: RIGHT FOOT COMPLETE - 3+ VIEW  COMPARISON:  None.  FINDINGS: Mild hallux valgus deformity is noted. There is a mildly displaced fracture of the third proximal phalanx. No other fracture is noted. No gross soft tissue abnormality is seen.  IMPRESSION: Fracture of the third proximal phalanx.   Electronically Signed   By: Inez Catalina M.D.   On: 07/12/2013 14:44     EKG Interpretation None      MDM   Final diagnoses:  Fracture of toe, right, closed, initial encounter    Patients labs and/or radiological studies were viewed and considered during the medical decision making and disposition process. Pt placed in post op shoe, buddy taped the toes.  Prescribed oxycodone, ibuprofen.  RICE. Referral to Dr. Sharol Given for f/u (who is his primary orthopedist.)    Evalee Jefferson, PA-C 07/14/13 [phone]

## 2013-07-15 NOTE — ED Provider Notes (Signed)
Medical screening examination/treatment/procedure(s) were performed by non-physician practitioner and as supervising physician I was immediately available for consultation/collaboration.   EKG Interpretation None        [place], DO 07/15/13 [phone]

## 2013-07-18 ENCOUNTER — Encounter (HOSPITAL_COMMUNITY): Payer: Self-pay | Admitting: Emergency Medicine

## 2013-07-18 ENCOUNTER — Inpatient Hospital Stay (HOSPITAL_COMMUNITY)
Admission: EM | Admit: 2013-07-18 | Discharge: 2013-07-20 | DRG: 603 | Disposition: A | Discharge status: Home / Self Care | Payer: Medicare Other | Attending: Internal Medicine | Admitting: Internal Medicine

## 2013-07-18 DIAGNOSIS — F411 Generalized anxiety disorder: Secondary | ICD-10-CM

## 2013-07-18 DIAGNOSIS — R7301 Impaired fasting glucose: Secondary | ICD-10-CM

## 2013-07-18 DIAGNOSIS — G609 Hereditary and idiopathic neuropathy, unspecified: Secondary | ICD-10-CM | POA: Diagnosis present

## 2013-07-18 DIAGNOSIS — F1910 Other psychoactive substance abuse, uncomplicated: Secondary | ICD-10-CM

## 2013-07-18 DIAGNOSIS — L0390 Cellulitis, unspecified: Secondary | ICD-10-CM | POA: Diagnosis present

## 2013-07-18 DIAGNOSIS — F410 Panic disorder [episodic paroxysmal anxiety] without agoraphobia: Secondary | ICD-10-CM

## 2013-07-18 DIAGNOSIS — F329 Major depressive disorder, single episode, unspecified: Secondary | ICD-10-CM | POA: Diagnosis present

## 2013-07-18 DIAGNOSIS — M129 Arthropathy, unspecified: Secondary | ICD-10-CM | POA: Diagnosis present

## 2013-07-18 DIAGNOSIS — I891 Lymphangitis: Secondary | ICD-10-CM

## 2013-07-18 DIAGNOSIS — F17200 Nicotine dependence, unspecified, uncomplicated: Secondary | ICD-10-CM | POA: Diagnosis present

## 2013-07-18 DIAGNOSIS — G4700 Insomnia, unspecified: Secondary | ICD-10-CM | POA: Diagnosis present

## 2013-07-18 DIAGNOSIS — L97509 Non-pressure chronic ulcer of other part of unspecified foot with unspecified severity: Secondary | ICD-10-CM | POA: Diagnosis present

## 2013-07-18 DIAGNOSIS — F3289 Other specified depressive episodes: Secondary | ICD-10-CM | POA: Diagnosis present

## 2013-07-18 DIAGNOSIS — F1020 Alcohol dependence, uncomplicated: Secondary | ICD-10-CM

## 2013-07-18 DIAGNOSIS — Z8673 Personal history of transient ischemic attack (TIA), and cerebral infarction without residual deficits: Secondary | ICD-10-CM

## 2013-07-18 DIAGNOSIS — L03119 Cellulitis of unspecified part of limb: Principal | ICD-10-CM

## 2013-07-18 DIAGNOSIS — L03115 Cellulitis of right lower limb: Secondary | ICD-10-CM | POA: Diagnosis present

## 2013-07-18 DIAGNOSIS — K219 Gastro-esophageal reflux disease without esophagitis: Secondary | ICD-10-CM | POA: Diagnosis present

## 2013-07-18 DIAGNOSIS — L02419 Cutaneous abscess of limb, unspecified: Principal | ICD-10-CM

## 2013-07-18 DIAGNOSIS — F1410 Cocaine abuse, uncomplicated: Secondary | ICD-10-CM

## 2013-07-18 DIAGNOSIS — Z96649 Presence of unspecified artificial hip joint: Secondary | ICD-10-CM

## 2013-07-18 DIAGNOSIS — F1110 Opioid abuse, uncomplicated: Secondary | ICD-10-CM | POA: Diagnosis present

## 2013-07-18 DIAGNOSIS — L97519 Non-pressure chronic ulcer of other part of right foot with unspecified severity: Secondary | ICD-10-CM

## 2013-07-18 DIAGNOSIS — Z96659 Presence of unspecified artificial knee joint: Secondary | ICD-10-CM

## 2013-07-18 LAB — COMPREHENSIVE METABOLIC PANEL
ALK PHOS: 108 U/L (ref 39–117)
ALT: 12 U/L (ref 0–53)
AST: 18 U/L (ref 0–37)
Albumin: 3.5 g/dL (ref 3.5–5.2)
BILIRUBIN TOTAL: 0.3 mg/dL (ref 0.3–1.2)
BUN: 7 mg/dL (ref 6–23)
CHLORIDE: 98 meq/L (ref 96–112)
CO2: 27 mEq/L (ref 19–32)
Calcium: 8.6 mg/dL (ref 8.4–10.5)
Creatinine, Ser: 0.88 mg/dL (ref 0.50–1.35)
GLUCOSE: 122 mg/dL — AB (ref 70–99)
POTASSIUM: 3.5 meq/L — AB (ref 3.7–5.3)
SODIUM: 135 meq/L — AB (ref 137–147)
Total Protein: 6.8 g/dL (ref 6.0–8.3)

## 2013-07-18 LAB — CBC WITH DIFFERENTIAL/PLATELET
BASOS ABS: 0 10*3/uL (ref 0.0–0.1)
Basophils Relative: 0 % (ref 0–1)
EOS PCT: 2 % (ref 0–5)
Eosinophils Absolute: 0.2 10*3/uL (ref 0.0–0.7)
HCT: 40.3 % (ref 39.0–52.0)
Hemoglobin: 13.3 g/dL (ref 13.0–17.0)
LYMPHS PCT: 28 % (ref 12–46)
Lymphs Abs: 1.9 10*3/uL (ref 0.7–4.0)
MCH: 26.3 pg (ref 26.0–34.0)
MCHC: 33 g/dL (ref 30.0–36.0)
MCV: 79.8 fL (ref 78.0–100.0)
Monocytes Absolute: 0.4 10*3/uL (ref 0.1–1.0)
Monocytes Relative: 6 % (ref 3–12)
NEUTROS PCT: 64 % (ref 43–77)
Neutro Abs: 4.3 10*3/uL (ref 1.7–7.7)
PLATELETS: 157 10*3/uL (ref 150–400)
RBC: 5.05 MIL/uL (ref 4.22–5.81)
RDW: 18 % — AB (ref 11.5–15.5)
WBC: 6.8 10*3/uL (ref 4.0–10.5)

## 2013-07-18 LAB — GLUCOSE, CAPILLARY: Glucose-Capillary: 104 mg/dL — ABNORMAL HIGH (ref 70–99)

## 2013-07-18 LAB — ETHANOL: Alcohol, Ethyl (B): <11 mg/dL (ref 0–11)

## 2013-07-18 MED ORDER — SODIUM CHLORIDE 0.9 % IJ SOLN
3.0000 mL | Freq: Two times a day (BID) | INTRAMUSCULAR | Status: DC
  Administered 2013-07-18: 3 mL via INTRAVENOUS

## 2013-07-18 MED ORDER — MIRTAZAPINE 30 MG PO TABS
30.0000 mg | ORAL_TABLET | Freq: Every day | ORAL | Status: DC
  Administered 2013-07-18 – 2013-07-19 (×2): 30 mg via ORAL
  Filled 2013-07-18 (×2): qty 1

## 2013-07-18 MED ORDER — ONDANSETRON HCL 4 MG/2ML IJ SOLN
4.0000 mg | Freq: Once | INTRAMUSCULAR | Status: AC
  Administered 2013-07-18: 4 mg via INTRAVENOUS
  Filled 2013-07-18: qty 2

## 2013-07-18 MED ORDER — VANCOMYCIN HCL IN DEXTROSE 1-5 GM/200ML-% IV SOLN
1000.0000 mg | Freq: Three times a day (TID) | INTRAVENOUS | Status: DC
  Administered 2013-07-19 – 2013-07-20 (×4): 1000 mg via INTRAVENOUS
  Filled 2013-07-18 (×8): qty 200

## 2013-07-18 MED ORDER — SODIUM CHLORIDE 0.9 % IJ SOLN: 3.0000 mL | INTRAMUSCULAR | Status: DC | PRN

## 2013-07-18 MED ORDER — VANCOMYCIN HCL IN DEXTROSE 1-5 GM/200ML-% IV SOLN
INTRAVENOUS | Status: AC
  Filled 2013-07-18: qty 200

## 2013-07-18 MED ORDER — KETOROLAC TROMETHAMINE 30 MG/ML IJ SOLN
30.0000 mg | Freq: Three times a day (TID) | INTRAMUSCULAR | Status: DC | PRN
  Administered 2013-07-18 – 2013-07-20 (×4): 30 mg via INTRAVENOUS
  Filled 2013-07-18 (×4): qty 1

## 2013-07-18 MED ORDER — ONDANSETRON HCL 4 MG/2ML IJ SOLN: 4.0000 mg | Freq: Three times a day (TID) | INTRAMUSCULAR | Status: AC | PRN

## 2013-07-18 MED ORDER — NICOTINE 21 MG/24HR TD PT24
21.0000 mg | MEDICATED_PATCH | Freq: Every day | TRANSDERMAL | Status: DC
  Administered 2013-07-18 – 2013-07-20 (×3): 21 mg via TRANSDERMAL
  Filled 2013-07-18 (×3): qty 1

## 2013-07-18 MED ORDER — CLONAZEPAM 0.5 MG PO TABS
1.0000 mg | ORAL_TABLET | Freq: Three times a day (TID) | ORAL | Status: DC
  Administered 2013-07-18 – 2013-07-20 (×5): 1 mg via ORAL
  Filled 2013-07-18 (×5): qty 2

## 2013-07-18 MED ORDER — ESCITALOPRAM OXALATE 10 MG PO TABS
20.0000 mg | ORAL_TABLET | Freq: Every morning | ORAL | Status: DC
  Administered 2013-07-19 – 2013-07-20 (×2): 20 mg via ORAL
  Filled 2013-07-18 (×2): qty 2

## 2013-07-18 MED ORDER — QUETIAPINE FUMARATE 100 MG PO TABS
200.0000 mg | ORAL_TABLET | Freq: Every day | ORAL | Status: DC
  Administered 2013-07-18 – 2013-07-19 (×2): 200 mg via ORAL
  Filled 2013-07-18 (×2): qty 2

## 2013-07-18 MED ORDER — VANCOMYCIN HCL IN DEXTROSE 1-5 GM/200ML-% IV SOLN
1000.0000 mg | Freq: Once | INTRAVENOUS | Status: AC
  Administered 2013-07-18: 1000 mg via INTRAVENOUS
  Filled 2013-07-18: qty 200

## 2013-07-18 MED ORDER — GABAPENTIN 400 MG PO CAPS
400.0000 mg | ORAL_CAPSULE | Freq: Four times a day (QID) | ORAL | Status: DC
  Administered 2013-07-18 – 2013-07-20 (×6): 400 mg via ORAL
  Filled 2013-07-18 (×6): qty 1

## 2013-07-18 MED ORDER — SODIUM CHLORIDE 0.9 % IV SOLN
250.0000 mL | INTRAVENOUS | Status: DC | PRN
  Administered 2013-07-19: 250 mL via INTRAVENOUS

## 2013-07-18 MED ORDER — MORPHINE SULFATE 4 MG/ML IJ SOLN
4.0000 mg | Freq: Once | INTRAMUSCULAR | Status: AC
  Administered 2013-07-18: 4 mg via INTRAVENOUS
  Filled 2013-07-18: qty 1

## 2013-07-18 NOTE — H&P (Signed)
PCP:   HASANAJ,XAJE A, MD   Chief Complaint:  Leg pain and swelling  HPI: 48 yo male h/o polysubstance abuse, prediabetes injured his right foot /fractured toe last week.  He had buddy taped two of his toes together, and several days later ripped the tape off.  When he took the tape off, skin was removed on the bottom of his foot.  By the next day this area started getting red, and he developed some blisters which then popped.  Since yesterday the redness has spread to his entire foot up to above his ankle and now he has a streak going up his right inner thigh up above his knee.  It hurts and is swollen.  He has had subj fevers and chills.  No recent abx.  He denies any ivda.  Review of Systems:  Positive and negative as per HPI otherwise all other systems are negative  Past Medical History: Past Medical History  Diagnosis Date  . Diabetic foot ulcer   . ETOH abuse   . Anxiety   . Open wound     bottom of foot  . Diabetes mellitus without complication     borderline  . Neuromuscular disorder     neuropathy  . GERD (gastroesophageal reflux disease)     tums  . Pneumonia ~ 2012  . History of blood transfusion     "related to left knee OR; probably right hip too" (04/21/2012)  . Stroke 2008    "they said I might have had one during right hip replacement" (04/21/2012)  . Arthritis     "everywhere" (04/21/2012)  . Mental disorder   . Depression   . DDD (degenerative disc disease)    Past Surgical History  Procedure Laterality Date  . Total hip arthroplasty Right 2008  . Total knee arthroplasty Left 2006  . Joint replacement    . Lung lobectomy Left ~ 2006  . Revision total hip arthroplasty Right 2008    "4-5 months after replacement" (04/21/2012)  . Knee arthroscopy Bilateral 1980's/1990's  . Lung lobectomy    . Metatarsal osteotomy  10/29/2011    Procedure: METATARSAL OSTEOTOMY;  Surgeon: [name], MD;  Location: [place] [facility];  Service: Orthopedics;  Laterality: Left;  Left 1st  Metatarsal Dorsal Closing Wedge   . Total knee arthroplasty Right 04/20/2012  . Total knee arthroplasty Right 04/20/2012    Procedure: TOTAL KNEE ARTHROPLASTY;  Surgeon: [name], MD;  Location: [place];  Service: Orthopedics;  Laterality: Right;  Right Total Knee Arthroplasty    Medications: Prior to Admission medications   Medication Sig Start Date End Date Taking? Authorizing Provider  clonazePAM (KLONOPIN) 1 MG tablet Take 1 mg by mouth 3 (three) times daily.   Yes Historical Provider, MD  escitalopram (LEXAPRO) 20 MG tablet Take 1 tablet (20 mg total) by mouth every morning. For depression 03/29/13  Yes Encarnacion Slates, NP  gabapentin (NEURONTIN) 400 MG capsule Take 1 capsule (400 mg total) by mouth 4 (four) times daily. For substance withdrawal syndrome 03/29/13  Yes Encarnacion Slates, NP  mirtazapine (REMERON) 30 MG tablet Take 30 mg by mouth at bedtime.   Yes Historical Provider, MD  QUEtiapine (SEROQUEL) 200 MG tablet Take 1 tablet (200 mg total) by mouth at bedtime. For mood control 03/29/13  Yes Encarnacion Slates, NP  ibuprofen (ADVIL,MOTRIN) 600 MG tablet Take 1 tablet (600 mg total) by mouth every 6 (six) hours as needed for moderate pain. 07/12/13  Evalee Jefferson, PA-C  oxyCODONE-acetaminophen (PERCOCET/ROXICET) 5-325 MG per tablet Take 1 tablet by mouth every 4 (four) hours as needed. 07/12/13   Evalee Jefferson, PA-C    Allergies:   Allergies  Allergen Reactions  . Benadryl [Diphenhydramine Hcl] Other (See Comments)    Pt states leg spasm  . Trazodone And Nefazodone Other (See Comments)    Leg Spasms.     Social History:  reports that he has been smoking Cigarettes.  He has a 30 pack-year smoking history. He has never used smokeless tobacco. He reports that he drinks alcohol. He reports that he uses illicit drugs (Marijuana and Cocaine).  Family History: History reviewed. No pertinent family history.  Physical Exam: Filed Vitals:   07/18/13 1815 07/18/13 2026  BP: 133/81 126/89  Pulse:  90 79  Temp: 98.6 F (37 C)   TempSrc: Oral   Resp: 20 16  Height: 6\' 3"  (3.151 m)   Weight: 99.791 kg (220 lb)   SpO2: 96% 95%   General appearance: alert, cooperative and no distress Head: Normocephalic, without obvious abnormality, atraumatic Eyes: negative Nose: Nares normal. Septum midline. Mucosa normal. No drainage or sinus tenderness. Neck: no JVD and supple, symmetrical, trachea midline Lungs: clear to auscultation bilaterally Heart: regular rate and rhythm, S1, S2 normal, no murmur, click, rub or gallop Abdomen: soft, non-tender; bowel sounds normal; no masses,  no organomegaly Extremities: extremities normal, atraumatic, no cyanosis or edema  Except below description of rt leg Pulses: 2+ and symmetric Skin: Skin color, texture, turgor normal. No rashes or lesions x swollen right foot with erythema over anterior rt foot above ankle with lymphagenitis up inner rt thigh midway c/w cellulitis/lymhagenitis Neurologic: Grossly normal  Labs on Admission:   Recent Labs  07/18/13 1925  NA 135*  K 3.5*  CL 98  CO2 27  GLUCOSE 122*  BUN 7  CREATININE 0.88  CALCIUM 8.6    Recent Labs  07/18/13 1925  AST 18  ALT 12  ALKPHOS 108  BILITOT 0.3  PROT 6.8  ALBUMIN 3.5    Recent Labs  07/18/13 1925  WBC 6.8  NEUTROABS 4.3  HGB 13.3  HCT 40.3  MCV 79.8  PLT 157   Radiological Exams on Admission: Dg Foot Complete Right  07/12/2013   CLINICAL DATA:  Right foot pain  EXAM: RIGHT FOOT COMPLETE - 3+ VIEW  COMPARISON:  None.  FINDINGS: Mild hallux valgus deformity is noted. There is a mildly displaced fracture of the third proximal phalanx. No other fracture is noted. No gross soft tissue abnormality is seen.  IMPRESSION: Fracture of the third proximal phalanx.   Electronically Signed   By: Inez Catalina M.D.   On: 07/12/2013 14:44    Assessment/Plan  48 yo male with recent injury to rt foot now with rle cellulitis/lymphagenitis  Principal Problem:   Cellulitis of  right leg-  Elevate leg.  Place on iv vancomycin.  Red area has been marked out with marker to help follow response to abx.    Active Problems:  Stable unless o/w noted   Neuropathic ulcer of foot   Substance abuse   Generalized anxiety disorder  obs on med.  Full code.  [name] A 07/18/2013, 9:07 PM

## 2013-07-18 NOTE — ED Notes (Signed)
Pt states came in last week for foot pain, pt thinks he has infection in rt leg, red and swollen.

## 2013-07-18 NOTE — ED Provider Notes (Signed)
CSN: [id number]     Arrival date & time 07/18/13  1807 History   First MD Initiated Contact with Patient 07/18/13 1811     Chief Complaint  Patient presents with  . Leg Pain     (Consider location/radiation/quality/duration/timing/severity/associated sxs/prior Treatment) The history is provided by the patient.   [name] is a 48 y.o. male presenting with redness, increased pain and swelling in his right foot with radiation of pain and redness into his right mid thigh.  He also noticed a blister on his right heel and his right great toe, also starting yesterday morning and has since popped,  Draining clear fluid.    His symptoms started yesterday am.  He was seen here 6 days ago for fracture to his right 3rd toe when he accidentally kicked a coffee table while walking.  He had no skin injury during that event and has been wearing a post op shoe since this diagnosis.  His past medical history is significant for DM and etoh abuse along with peripheral neuropathy.  He denies fevers, chills, nausea.  He does endorse dry mouth but states this is chronic.  He has not checked his cbg today.    Past Medical History  Diagnosis Date  . Diabetic foot ulcer   . ETOH abuse   . Anxiety   . Open wound     bottom of foot  . Diabetes mellitus without complication     borderline  . Neuromuscular disorder     neuropathy  . GERD (gastroesophageal reflux disease)     tums  . Pneumonia ~ 2012  . History of blood transfusion     "related to left knee OR; probably right hip too" (04/21/2012)  . Stroke 2008    "they said I might have had one during right hip replacement" (04/21/2012)  . Arthritis     "everywhere" (04/21/2012)  . Mental disorder   . Depression   . DDD (degenerative disc disease)    Past Surgical History  Procedure Laterality Date  . Total hip arthroplasty Right 2008  . Total knee arthroplasty Left 2006  . Joint replacement    . Lung lobectomy Left ~ 2006  . Revision total hip  arthroplasty Right 2008    "4-5 months after replacement" (04/21/2012)  . Knee arthroscopy Bilateral 1980's/1990's  . Lung lobectomy    . Metatarsal osteotomy  10/29/2011    Procedure: METATARSAL OSTEOTOMY;  Surgeon: [name], MD;  Location: [facility];  Service: Orthopedics;  Laterality: Left;  Left 1st Metatarsal Dorsal Closing Wedge   . Total knee arthroplasty Right 04/20/2012  . Total knee arthroplasty Right 04/20/2012    Procedure: TOTAL KNEE ARTHROPLASTY;  Surgeon: [name], MD;  Location: [place];  Service: Orthopedics;  Laterality: Right;  Right Total Knee Arthroplasty   History reviewed. No pertinent family history. History  Substance Use Topics  . Smoking status: Current Every Day Smoker -- 1.00 packs/day for 30 years    Types: Cigarettes  . Smokeless tobacco: Never Used     Comment: 04/21/2012 offered smoking cessation materials; pt declines  . Alcohol Use: 0.0 oz/week     Comment: last used march 11    Review of Systems  Constitutional: Negative for fever and chills.  Respiratory: Negative for shortness of breath and wheezing.   Cardiovascular: Positive for leg swelling. Negative for chest pain.  Skin: Positive for color change.  Neurological: Negative for numbness.      Allergies  Benadryl and  Trazodone and nefazodone  Home Medications   Prior to Admission medications   Medication Sig Start Date End Date Taking? Authorizing Provider  clonazePAM (KLONOPIN) 1 MG tablet Take 1 mg by mouth 3 (three) times daily.   Yes Historical Provider, MD  escitalopram (LEXAPRO) 20 MG tablet Take 1 tablet (20 mg total) by mouth every morning. For depression 03/29/13  Yes Encarnacion Slates, NP  gabapentin (NEURONTIN) 400 MG capsule Take 1 capsule (400 mg total) by mouth 4 (four) times daily. For substance withdrawal syndrome 03/29/13  Yes Encarnacion Slates, NP  mirtazapine (REMERON) 30 MG tablet Take 30 mg by mouth at bedtime.   Yes Historical Provider, MD  QUEtiapine (SEROQUEL) 200 MG tablet  Take 1 tablet (200 mg total) by mouth at bedtime. For mood control 03/29/13  Yes Encarnacion Slates, NP  ibuprofen (ADVIL,MOTRIN) 600 MG tablet Take 1 tablet (600 mg total) by mouth every 6 (six) hours as needed for moderate pain. 07/12/13   Evalee Jefferson, PA-C  oxyCODONE-acetaminophen (PERCOCET/ROXICET) 5-325 MG per tablet Take 1 tablet by mouth every 4 (four) hours as needed. 07/12/13   Evalee Jefferson, PA-C   BP 126/89  Pulse 79  Temp(Src) 98.6 F (37 C) (Oral)  Resp 16  Ht 6\' 3"  (1.905 m)  Wt 220 lb (99.791 kg)  BMI 27.50 kg/m2  SpO2 95% Physical Exam  Nursing note and vitals reviewed. Constitutional: He appears well-developed and well-nourished.  HENT:  Head: Normocephalic and atraumatic.  Eyes: Conjunctivae are normal.  Neck: Normal range of motion.  Cardiovascular: Normal rate, regular rhythm, normal heart sounds and intact distal pulses.   Pulmonary/Chest: Effort normal and breath sounds normal. He has no wheezes.  Abdominal: Soft. Bowel sounds are normal. There is no tenderness.  Musculoskeletal: Normal range of motion.  Neurological: He is alert.  Skin: Skin is warm and dry. There is erythema.  Denuded blistered right medial heel and right plantar great toe.  Erythema and edema dorsal foot and ankle with lymphangitis to mid medial upper thigh. Dorsalis pedis pulse intact.  Psychiatric: He has a normal mood and affect.    ED Course  Procedures (including critical care time) Labs Review Labs Reviewed  CBC WITH DIFFERENTIAL - Abnormal; Notable for the following:    RDW 18.0 (*)    All other components within normal limits  COMPREHENSIVE METABOLIC PANEL - Abnormal; Notable for the following:    Sodium 135 (*)    Potassium 3.5 (*)    Glucose, Bld 122 (*)    All other components within normal limits  CULTURE, BLOOD (ROUTINE X 2)  CULTURE, BLOOD (ROUTINE X 2)  ETHANOL  URINE RAPID DRUG SCREEN (HOSP PERFORMED)    Imaging Review No results found.   EKG Interpretation None       MDM   Final diagnoses:  Cellulitis of foot, right  Lymphangitis    Patients labs and/or radiological studies were viewed and considered during the medical decision making and disposition process. Pt was also seen by Dr. Leonides Schanz during this visit.  Pt with significant cellulitis of right lower extremity with lymphangitis to upper leg since yesterday.  He was given IV vancomycin.  Call placed for admission for further abx.  Temp admission orders placed.   Evalee Jefferson, PA-C 07/18/13 2112

## 2013-07-18 NOTE — ED Provider Notes (Signed)
Medical screening examination/treatment/procedure(s) were conducted as a shared visit with non-physician practitioner(s) and myself.  I personally evaluated the patient during the encounter.   EKG Interpretation None      Pt is 48 y.o. M with history of alcohol abuse, prediabetes who presents emergency department with cellulitis, lymphangitis of his right lower extremity. Patient was here several days ago for a right third toe fracture. He states the symptoms of redness, swelling and increasing pain started yesterday. He has an ulcer to his right heel and great toe with no purulent drainage. He states he has pain that tracks all of his leg into his groin. He states he has had chills but no fever, nausea, vomiting or diarrhea. On exam, patient has swelling in his foot and ankle with erythema and warmth that spreads up his posterior calf. Has obvious cellulitis and lymphangitis. Also concerned for possible DVT. He would likely need admission for IV antibiotics given the rapid spread of his infection and a venous Doppler can be ordered here in the hospital. Patient comfortable with this plan. Patient denies chest pain or shortness.  [place], DO 07/18/13 1906

## 2013-07-18 NOTE — Progress Notes (Signed)
ANTIBIOTIC CONSULT NOTE - INITIAL  Pharmacy Consult for Vancomycin Indication: cellulitis   Allergies  Allergen Reactions  . Benadryl [Diphenhydramine Hcl] Other (See Comments)    Pt states leg spasm  . Trazodone And Nefazodone Other (See Comments)    Leg Spasms.     Patient Measurements: Height: 6\' 3"  (190.5 cm) Weight: 217 lb 12.8 oz (98.793 kg) IBW/kg (Calculated) : 84.5  Vital Signs: Temp: 97.6 F (36.4 C) (06/30 2231) Temp src: Oral (06/30 2231) BP: 134/84 mmHg (06/30 2231) Pulse Rate: 71 (06/30 2231) Intake/Output from previous day:   Intake/Output from this shift: Total I/O In: -  Out: 850 [Urine:850]  Labs:  Recent Labs  07/18/13 1925  WBC 6.8  HGB 13.3  PLT 157  CREATININE 0.88   Estimated Creatinine Clearance: 120 ml/min (by C-G formula based on Cr of 0.88). No results found for this basename: VANCOTROUGH, VANCOPEAK, VANCORANDOM, GENTTROUGH, GENTPEAK, GENTRANDOM, TOBRATROUGH, TOBRAPEAK, TOBRARND, AMIKACINPEAK, AMIKACINTROU, AMIKACIN,  in the last 72 hours   Microbiology: Recent Results (from the past 720 hour(s))  CULTURE, BLOOD (ROUTINE X 2)     Status: None   Collection Time    07/18/13  7:25 PM      Result Value Ref Range Status   Specimen Description BLOOD LEFT ARM   Final   Special Requests     Final   Value: BOTTLES DRAWN AEROBIC AND ANAEROBIC AEB12CC ANA 10CC   Culture PENDING   Incomplete   Report Status PENDING   Incomplete  CULTURE, BLOOD (ROUTINE X 2)     Status: None   Collection Time    07/18/13  8:00 PM      Result Value Ref Range Status   Specimen Description BLOOD RIGHT ARM   Final   Special Requests     Final   Value: BOTTLES DRAWN AEROBIC AND ANAEROBIC AEB 10CC ANA 12CC   Culture PENDING   Incomplete   Report Status PENDING   Incomplete    Medical History: Past Medical History  Diagnosis Date  . Diabetic foot ulcer   . ETOH abuse   . Anxiety   . Open wound     bottom of foot  . Diabetes mellitus without complication      borderline  . Neuromuscular disorder     neuropathy  . GERD (gastroesophageal reflux disease)     tums  . Pneumonia ~ 2012  . History of blood transfusion     "related to left knee OR; probably right hip too" (04/21/2012)  . Stroke 2008    "they said I might have had one during right hip replacement" (04/21/2012)  . Arthritis     "everywhere" (04/21/2012)  . Mental disorder   . Depression   . DDD (degenerative disc disease)     Medications:  Prescriptions prior to admission  Medication Sig Dispense Refill  . clonazePAM (KLONOPIN) 1 MG tablet Take 1 mg by mouth 3 (three) times daily.      Marland Kitchen escitalopram (LEXAPRO) 20 MG tablet Take 1 tablet (20 mg total) by mouth every morning. For depression  30 tablet  0  . gabapentin (NEURONTIN) 400 MG capsule Take 1 capsule (400 mg total) by mouth 4 (four) times daily. For substance withdrawal syndrome  120 capsule  0  . mirtazapine (REMERON) 30 MG tablet Take 30 mg by mouth at bedtime.      Marland Kitchen QUEtiapine (SEROQUEL) 200 MG tablet Take 1 tablet (200 mg total) by mouth at bedtime. For mood control  30 tablet  0  . ibuprofen (ADVIL,MOTRIN) 600 MG tablet Take 1 tablet (600 mg total) by mouth every 6 (six) hours as needed for moderate pain.  30 tablet  0  . oxyCODONE-acetaminophen (PERCOCET/ROXICET) 5-325 MG per tablet Take 1 tablet by mouth every 4 (four) hours as needed.  20 tablet  0   Assessment: Okay for Protocol, 42 male being treated for cellulitis, received initial dose in ED of Vancomycin.  Goal of Therapy:  Vancomycin trough level 10-15 mcg/ml  Plan:  Vancomycin 1gm IV every 8 hours. Measure antibiotic drug levels at steady state Follow up culture results  [name] 07/18/2013,10:34 PM

## 2013-07-18 NOTE — ED Provider Notes (Signed)
Medical screening examination/treatment/procedure(s) were performed by non-physician practitioner and as supervising physician I was immediately available for consultation/collaboration.   EKG Interpretation None        Delice Bison Ward, DO 07/18/13 2310

## 2013-07-19 ENCOUNTER — Observation Stay (HOSPITAL_COMMUNITY): Payer: Medicare Other

## 2013-07-19 DIAGNOSIS — L02619 Cutaneous abscess of unspecified foot: Secondary | ICD-10-CM

## 2013-07-19 DIAGNOSIS — F1910 Other psychoactive substance abuse, uncomplicated: Secondary | ICD-10-CM

## 2013-07-19 DIAGNOSIS — L97509 Non-pressure chronic ulcer of other part of unspecified foot with unspecified severity: Secondary | ICD-10-CM

## 2013-07-19 DIAGNOSIS — L03115 Cellulitis of right lower limb: Secondary | ICD-10-CM | POA: Diagnosis present

## 2013-07-19 DIAGNOSIS — L03119 Cellulitis of unspecified part of limb: Secondary | ICD-10-CM

## 2013-07-19 LAB — RAPID URINE DRUG SCREEN, HOSP PERFORMED
Amphetamines: NOT DETECTED
BARBITURATES: NOT DETECTED
Benzodiazepines: POSITIVE — AB
Cocaine: POSITIVE — AB
Opiates: POSITIVE — AB
Tetrahydrocannabinol: NOT DETECTED

## 2013-07-19 LAB — GLUCOSE, CAPILLARY: Glucose-Capillary: 100 mg/dL — ABNORMAL HIGH (ref 70–99)

## 2013-07-19 LAB — BASIC METABOLIC PANEL
BUN: 9 mg/dL (ref 6–23)
CALCIUM: 8.6 mg/dL (ref 8.4–10.5)
CO2: 29 mEq/L (ref 19–32)
CREATININE: 1 mg/dL (ref 0.50–1.35)
Chloride: 102 mEq/L (ref 96–112)
GFR calc Af Amer: >90 mL/min (ref >90)
GFR calc non Af Amer: 86 mL/min — ABNORMAL LOW (ref >90)
GLUCOSE: 108 mg/dL — AB (ref 70–99)
Potassium: 4 mEq/L (ref 3.7–5.3)
Sodium: 140 mEq/L (ref 137–147)

## 2013-07-19 LAB — CBC
HCT: 40.8 % (ref 39.0–52.0)
HEMOGLOBIN: 13.2 g/dL (ref 13.0–17.0)
MCH: 26.1 pg (ref 26.0–34.0)
MCHC: 32.4 g/dL (ref 30.0–36.0)
MCV: 80.6 fL (ref 78.0–100.0)
Platelets: 166 10*3/uL (ref 150–400)
RBC: 5.06 MIL/uL (ref 4.22–5.81)
RDW: 18.3 % — ABNORMAL HIGH (ref 11.5–15.5)
WBC: 5.7 10*3/uL (ref 4.0–10.5)

## 2013-07-19 MED ORDER — ENOXAPARIN SODIUM 40 MG/0.4ML ~~LOC~~ SOLN
40.0000 mg | Freq: Every day | SUBCUTANEOUS | Status: DC
  Filled 2013-07-19: qty 0.4

## 2013-07-19 MED ORDER — SILVER SULFADIAZINE 1 % EX CREA
TOPICAL_CREAM | Freq: Two times a day (BID) | CUTANEOUS | Status: DC
  Administered 2013-07-19 – 2013-07-20 (×3): via TOPICAL
  Filled 2013-07-19: qty 85

## 2013-07-19 MED ORDER — OXYCODONE-ACETAMINOPHEN 5-325 MG PO TABS
1.0000 | ORAL_TABLET | Freq: Four times a day (QID) | ORAL | Status: DC | PRN
  Administered 2013-07-19: 1 via ORAL
  Administered 2013-07-19 – 2013-07-20 (×3): 2 via ORAL
  Filled 2013-07-19 (×5): qty 2

## 2013-07-19 NOTE — Care Management Note (Addendum)
    Page 1 of 1   07/20/2013     11:18:50 AM CARE MANAGEMENT NOTE 07/20/2013  Patient:  [name], [name]   Account Number:  [id number]  Date Initiated:  07/19/2013  Documentation initiated by:  Theophilus Kinds  Subjective/Objective Assessment:   Pt admitted from home with cellulitis. Pt lives alone and will return home at discharge. Pt has family/friends that are active in the care of the pt. Pt is fairly independent with ADlL's. Pt has a cane for home use. Pts PCP is Dr. Zada Girt.     Action/Plan:   No CM needs noted.   Anticipated DC Date:  07/22/2013   Anticipated DC Plan:  [place]  CM consult      Choice offered to / List presented to:             Status of service:  Completed, signed off Medicare Important Message given?   (If response is "NO", the following Medicare IM given date fields will be blank) Date Medicare IM given:   Medicare IM given by:   Date Additional Medicare IM given:   Additional Medicare IM given by:    Discharge Disposition:  HOME/SELF CARE  Per UR Regulation:    If discussed at Long Length of Stay Meetings, dates discussed:    Comments:  07/20/13 [place], RN BSN CM Pt discharged home today. No CM needs noted.  07/19/13 [place], RN BSN CM

## 2013-07-19 NOTE — Progress Notes (Signed)
[facility]                                                                              Patient Demographics  [name], is a 48 y.o. male, DOB - 1965/09/17, NLZ:[id number]  Admit date - 07/18/2013   Admitting Physician Phillips Grout, MD  Outpatient Primary MD for the patient is Neale Burly, MD  LOS - 1   Chief Complaint  Patient presents with  . Leg Pain      HPI 48 yo male with a history of polysubstance abuse, prediabetes, injured his right foot /fractured toe last week. He had buddy taped two of his toes together, and several days later ripped the tape off. When he took the tape off, his skin was removed on the bottom of his foot. By the next day this area started getting red, and he developed some blisters which then popped. Since then, the redness has spread to his entire foot up to above his ankle and now he has a streak going up his right inner thigh up above his knee. He complains of pain and swelling. He has had subjective fevers and chills. No recent antibiotics. He denies any IV drug abuse.   Assessment & Plan   Right lower extremity cellulitis -Will continue vancomycin and pain control -Blood cultures negative to date.  -Consult wound care, recommended to follow up with Dr. Sharol Given as an outpatient. -Right Lower extremity Doppler was negative for DVT  Polysubstance abuse -Toxicology screen was positive for benzodiazepines, opiates, cocaine -Patient was counseled  Nicotine abuse -Continue nicotine patch, patient counseled  Depression/Anxiety/Insomnia -Continue Klonopin, Lexapro, Remeron, Seroquel  Neuropathic pain -Continue gabapentin  Code Status: Full  Family Communication: None at bedside  Disposition Plan: Admitted, will change to inpatient.   Time Spent in minutes   30 minutes  Procedures  Right lower extremity Doppler IMPRESSION: No evidence of right lower extremity deep venous thrombosis. Very limited visualization and ability to  compress the calf veins.  Consults   Wound care  DVT Prophylaxis  Lovenox  Lab Results  Component Value Date   PLT 166 07/19/2013    Medications  Scheduled Meds: . clonazePAM  1 mg Oral TID  . escitalopram  20 mg Oral q morning - 10a  . gabapentin  400 mg Oral QID  . mirtazapine  30 mg Oral QHS  . nicotine  21 mg Transdermal Daily  . QUEtiapine  200 mg Oral QHS  . silver sulfADIAZINE   Topical BID  . sodium chloride  3 mL Intravenous Q12H  . vancomycin  1,000 mg Intravenous Q8H   Continuous Infusions:  PRN Meds:.sodium chloride, ketorolac, oxyCODONE-acetaminophen, sodium chloride  Antibiotics    Anti-infectives   Start     Dose/Rate Route Frequency Ordered Stop   07/19/13 0400  vancomycin (VANCOCIN) IVPB 1000 mg/200 mL premix     1,000 mg 200 mL/hr over 60 Minutes Intravenous Every 8 hours 07/18/13 2240     07/18/13 1845  vancomycin (VANCOCIN) IVPB 1000 mg/200 mL premix     1,000 mg 200 mL/hr over 60 Minutes Intravenous  Once 07/18/13 1834 07/18/13 1948        Subjective:  [name] seen and examined today.  Patient continues to complain of pain in his leg. He states the Toradol is not helping him. Patient denies any chest pain or shortness of breath at this time. Does feel that the redness of his right leg has improved slightly.  Objective:   Filed Vitals:   07/18/13 1815 07/18/13 2026 07/18/13 2231 07/19/13 0523  BP: 133/81 126/89 134/84 122/76  Pulse: 90 79 71 74  Temp: 98.6 F (37 C)  97.6 F (36.4 C) 97.6 F (36.4 C)  TempSrc: Oral  Oral Oral  Resp: 20 16 20 20   Height: 6\' 3"  (1.905 m)  6\' 3"  (1.905 m)   Weight: 99.791 kg (220 lb)  98.793 kg (217 lb 12.8 oz)   SpO2: 96% 95% 98% 96%    Wt Readings from Last 3 Encounters:  07/18/13 98.793 kg (217 lb 12.8 oz)  07/12/13 99.791 kg (220 lb)  06/30/13 99.791 kg (220 lb)     Intake/Output Summary (Last 24 hours) at 07/19/13 1415 Last data filed at 07/19/13 1033  Gross per 24 hour  Intake    200  ml  Output   1250 ml  Net  -1050 ml    Exam  General: Well developed, well nourished, NAD, appears stated age  HEENT: NCAT, PERRLA, EOMI, Anicteic Sclera, mucous membranes moist.   Neck: Supple, no JVD, no masses  Cardiovascular: S1 S2 auscultated, no rubs, murmurs or gallops. Regular rate and rhythm.  Respiratory: Clear to auscultation bilaterally with equal chest rise  Abdomen: Soft, nontender, nondistended, + bowel sounds  Extremities: warm dry without cyanosis clubbing. RLE edema and erythema- ucler on medial aspect of right heel, plantar surface of great toe.   Neuro: AAOx3, cranial nerves grossly intact. Strength 5/5 in patient's upper and lower extremities bilaterally  Skin: Without rashes exudates or nodules  Psych: Normal affect and demeanor with intact judgement and insight  Data Review   Micro Results Recent Results (from the past 240 hour(s))  CULTURE, BLOOD (ROUTINE X 2)     Status: None   Collection Time    07/18/13  7:25 PM      Result Value Ref Range Status   Specimen Description BLOOD LEFT ARM   Final   Special Requests     Final   Value: BOTTLES DRAWN AEROBIC AND ANAEROBIC AEB=12CC ANA=10CC   Culture NO GROWTH 1 DAY   Final   Report Status PENDING   Incomplete  CULTURE, BLOOD (ROUTINE X 2)     Status: None   Collection Time    07/18/13  8:00 PM      Result Value Ref Range Status   Specimen Description BLOOD RIGHT ARM   Final   Special Requests     Final   Value: BOTTLES DRAWN AEROBIC AND ANAEROBIC AEB=10CC ANA=12CC   Culture NO GROWTH 1 DAY   Final   Report Status PENDING   Incomplete    Radiology Reports Dg Hip Complete Right  06/30/2013   CLINICAL DATA:  Fall.  Right hip pain.  EXAM: RIGHT HIP - COMPLETE 2+ VIEW  COMPARISON:  05/11/2013  FINDINGS: Changes of right hip replacement are stable. No hardware or bony complicating feature. Moderate degenerative changes in the left hip. SI joints are symmetric and unremarkable.  IMPRESSION: No acute  bony abnormality.  Prior right hip replacement.   Electronically Signed   By: Rolm Baptise M.D.   On: 06/30/2013 16:37   Dg Ankle Complete Left  06/30/2013  CLINICAL DATA:  Fall.  Ankle pain.  EXAM: LEFT ANKLE COMPLETE - 3+ VIEW  COMPARISON:  04/19/2011  FINDINGS: No acute bony abnormality. Specifically, no fracture, subluxation, or dislocation. Soft tissues are intact. Joint spaces are maintained. Normal bone mineralization.  IMPRESSION: No acute bony abnormality.   Electronically Signed   By: Rolm Baptise M.D.   On: 06/30/2013 16:44   US Venous Img Lower Unilateral Right  07/19/2013   CLINICAL DATA:  Right lower extremity pain and edema and diabetic ulcer.  EXAM: RIGHT LOWER EXTREMITY VENOUS DOPPLER ULTRASOUND  TECHNIQUE: Gray-scale sonography with graded compression, as well as color Doppler and duplex ultrasound were performed to evaluate the lower extremity deep venous systems from the level of the common femoral vein and including the common femoral, femoral, profunda femoral, popliteal and calf veins including the posterior tibial, peroneal and gastrocnemius veins when visible. The superficial great saphenous vein was also interrogated. Spectral Doppler was utilized to evaluate flow at rest and with distal augmentation maneuvers in the common femoral, femoral and popliteal veins.  COMPARISON:  None.  FINDINGS: Common Femoral Vein: No evidence of thrombus. Normal compressibility, respiratory phasicity and response to augmentation.  Saphenofemoral Junction: No evidence of thrombus. Normal compressibility and flow on color Doppler imaging.  Profunda Femoral Vein: No evidence of thrombus. Normal compressibility and flow on color Doppler imaging.  Femoral Vein: No evidence of thrombus. Normal compressibility, respiratory phasicity and response to augmentation.  Popliteal Vein: No evidence of thrombus. Normal compressibility, respiratory phasicity and response to augmentation.  Calf Veins: The tibial veins  are very poorly visualized. Only a limited portion of the posterior tibial and proximal peroneal veins could be seen. These were not able to be well compressed do de limited visualization.  Superficial Great Saphenous Vein: No evidence of thrombus. Normal compressibility and flow on color Doppler imaging.  Venous Reflux:  None.  Other Findings: Diffuse subcutaneous edema is identified, especially in the lower calf and ankle region. There is no evidence of superficial thrombophlebitis. No focal fluid collections are identified. Incidental note is made of some mildly prominent right-sided inguinal lymph nodes with the largest measuring 1.9 cm in long axis.  IMPRESSION: No evidence of right lower extremity deep venous thrombosis. Very limited visualization and ability to compress the calf veins.   Electronically Signed   By: Aletta Edouard M.D.   On: 07/19/2013 13:16   Dg Knee Complete 4 Views Right  06/30/2013   CLINICAL DATA:  Fall.  EXAM: RIGHT KNEE - COMPLETE 4+ VIEW  COMPARISON:  05/11/2013  FINDINGS: The right total arthroplasty is intact and unchanged. There is no acute fracture dislocation. No significant joint effusion.  IMPRESSION: No acute findings.   Electronically Signed   By: Marin Olp M.D.   On: 06/30/2013 16:42   Dg Foot Complete Left  06/30/2013   CLINICAL DATA:  Fall.  EXAM: LEFT FOOT - COMPLETE 3+ VIEW  COMPARISON:  11/15/2009  FINDINGS: There is an orthopedic screw extending from the first metatarsal into the region of the middle cuneiform bone bridging the tarsal metatarsal joint. There are mild degenerative changes over the midfoot. There is no acute fracture dislocation.  IMPRESSION: No acute findings.   Electronically Signed   By: Marin Olp M.D.   On: 06/30/2013 16:45   Dg Foot Complete Right  07/12/2013   CLINICAL DATA:  Right foot pain  EXAM: RIGHT FOOT COMPLETE - 3+ VIEW  COMPARISON:  None.  FINDINGS: Mild hallux valgus deformity is  noted. There is a mildly displaced  fracture of the third proximal phalanx. No other fracture is noted. No gross soft tissue abnormality is seen.  IMPRESSION: Fracture of the third proximal phalanx.   Electronically Signed   By: Inez Catalina M.D.   On: 07/12/2013 14:44    CBC  Recent Labs Lab 07/18/13 1925 07/19/13 0535  WBC 6.8 5.7  HGB 13.3 13.2  HCT 40.3 40.8  PLT 157 166  MCV 79.8 80.6  MCH 26.3 26.1  MCHC 33.0 32.4  RDW 18.0* 18.3*  LYMPHSABS 1.9  --   MONOABS 0.4  --   EOSABS 0.2  --   BASOSABS 0.0  --     Chemistries   Recent Labs Lab 07/18/13 1925 07/19/13 0535  NA 135* 140  K 3.5* 4.0  CL 98 102  CO2 27 29  GLUCOSE 122* 108*  BUN 7 9  CREATININE 0.88 1.00  CALCIUM 8.6 8.6  AST 18  --   ALT 12  --   ALKPHOS 108  --   BILITOT 0.3  --    ------------------------------------------------------------------------------------------------------------------ estimated creatinine clearance is 105.6 ml/min (by C-G formula based on Cr of 1). ------------------------------------------------------------------------------------------------------------------ No results found for this basename: HGBA1C,  in the last 72 hours ------------------------------------------------------------------------------------------------------------------ No results found for this basename: CHOL, HDL, LDLCALC, TRIG, CHOLHDL, LDLDIRECT,  in the last 72 hours ------------------------------------------------------------------------------------------------------------------ No results found for this basename: TSH, T4TOTAL, FREET3, T3FREE, THYROIDAB,  in the last 72 hours ------------------------------------------------------------------------------------------------------------------ No results found for this basename: VITAMINB12, FOLATE, FERRITIN, TIBC, IRON, RETICCTPCT,  in the last 72 hours  Coagulation profile No results found for this basename: INR, PROTIME,  in the last 168 hours  No results found for this basename:  DDIMER,  in the last 72 hours  Cardiac Enzymes No results found for this basename: CK, CKMB, TROPONINI, MYOGLOBIN,  in the last 168 hours ------------------------------------------------------------------------------------------------------------------ No components found with this basename: POCBNP,     [name] D.O. on 07/19/2013 at 2:15 PM  Between 7am to 7pm - Pager - [phone]  After 7pm go to [email or web address] - password [email or web address]  And look for the night coverage person covering for me after hours  [facility] Office  [phone]

## 2013-07-19 NOTE — Progress Notes (Signed)
UR completed 

## 2013-07-19 NOTE — Consult Note (Addendum)
WOC wound consult note (consult completed via [facility]) Reason for Consult: evaluation of foot ulcer. Pt reports ulceration on both feet. He has a history of foot ulcer on the left plantar surface with surgical intervention in the past per Dr. Sharol Given. 10/2011.  He reports this will heal and some times reopen at which time he seeks follow up with Dr. Sharol Given. He has two new areas on the right foot and contributes them ill fitting shoe that he was wearing for accomodation of his broken toe.  He has some skin stripping on the dorsal foot with some blistering related to tape.   Wound type: Neuropathic foot ulcerations x 3 Left first metatarsal head; plantar surface; healed with callous, no open currently Right great toe, plantar surface; 3cm x 2cm x 0.2cm hypergranulated, 100% pink Right medial heel: 1.5 cm x 2.0cm x 0.2cm; 90% pink, 10% yellow, partial thickness, clean and moist   Pressure Ulcer POA: No Measurement:see above Wound bed:see above Drainage (amount, consistency, odor) minimal Periwound:intact, the cellulitis associated with the right LE has improved based on the previous site markings Dressing procedure/placement/frequency: Silvadene for the open areas on the right foot, top with dry dressing. Schedule follow up with Dr. Sharol Given on an outpatient basis since his has such and extensive history with his other foot. No topical care needed for the left foot wound.   Discussed POC with patient and bedside nurse.  Re consult if needed, will not follow at this time. Thanks  [name] [place], [place] [phone])

## 2013-07-20 MED ORDER — OXYCODONE-ACETAMINOPHEN 5-325 MG PO TABS: 1.0000 | ORAL_TABLET | ORAL | Status: DC | PRN

## 2013-07-20 MED ORDER — CLINDAMYCIN HCL 300 MG PO CAPS: 300.0000 mg | ORAL_CAPSULE | Freq: Three times a day (TID) | ORAL | Status: DC

## 2013-07-20 MED ORDER — SILVER SULFADIAZINE 1 % EX CREA: TOPICAL_CREAM | Freq: Two times a day (BID) | CUTANEOUS | Status: DC

## 2013-07-20 MED ORDER — NICOTINE 21 MG/24HR TD PT24: 21.0000 mg | MEDICATED_PATCH | Freq: Every day | TRANSDERMAL | Status: DC

## 2013-07-20 NOTE — Discharge Instructions (Signed)
Cellulitis Cellulitis is an infection of the skin and the tissue beneath it. The infected area is usually red and tender. Cellulitis occurs most often in the arms and lower legs.  CAUSES  Cellulitis is caused by bacteria that enter the skin through cracks or cuts in the skin. The most common types of bacteria that cause cellulitis are Staphylococcus and Streptococcus. SYMPTOMS   Redness and warmth.  Swelling.  Tenderness or pain.  Fever. DIAGNOSIS  Your caregiver can usually determine what is wrong based on a physical exam. Blood tests may also be done. TREATMENT  Treatment usually involves taking an antibiotic medicine. HOME CARE INSTRUCTIONS   Take your antibiotics as directed. Finish them even if you start to feel better.  Keep the infected arm or leg elevated to reduce swelling.  Apply a warm cloth to the affected area up to 4 times per day to relieve pain.  Only take over-the-counter or prescription medicines for pain, discomfort, or fever as directed by your caregiver.  Keep all follow-up appointments as directed by your caregiver. SEEK MEDICAL CARE IF:   You notice red streaks coming from the infected area.  Your red area gets larger or turns dark in color.  Your bone or joint underneath the infected area becomes painful after the skin has healed.  Your infection returns in the same area or another area.  You notice a swollen bump in the infected area.  You develop new symptoms. SEEK IMMEDIATE MEDICAL CARE IF:   You have a fever.  You feel very sleepy.  You develop vomiting or diarrhea.  You have a general ill feeling (malaise) with muscle aches and pains. MAKE SURE YOU:   Understand these instructions.  Will watch your condition.  Will get help right away if you are not doing well or get worse. Document Released: 10/15/2004 Document Revised: 07/07/2011 Document Reviewed: 03/23/2011 [facility] Patient Information 2015 [place], [place]. This information is  not intended to replace advice given to you by your health care provider. Make sure you discuss any questions you have with your health care provider.  Polysubstance Abuse When people abuse more than one drug or type of drug it is called polysubstance or polydrug abuse. For example, many smokers also drink alcohol. This is one form of polydrug abuse. Polydrug abuse also refers to the use of a drug to counteract an unpleasant effect produced by another drug. It may also be used to help with withdrawal from another drug. People who take stimulants may become agitated. Sometimes this agitation is countered with a tranquilizer. This helps protect against the unpleasant side effects. Polydrug abuse also refers to the use of different drugs at the same time.  Anytime drug use is interfering with normal living activities, it has become abuse. This includes problems with family and friends. Psychological dependence has developed when your mind tells you that the drug is needed. This is usually followed by physical dependence which has developed when continuing increases of drug are required to get the same feeling or "high". This is known as addiction or chemical dependency. A person's risk is much higher if there is a history of chemical dependency in the family. SIGNS OF CHEMICAL DEPENDENCY  You have been told by friends or family that drugs have become a problem.  You fight when using drugs.  You are having blackouts (not remembering what you do while using).  You feel sick from using drugs but continue using.  You lie about use or amounts of  drugs (chemicals) used.  You need chemicals to get you going.  You are suffering in work performance or in school because of drug use.  You get sick from use of drugs but continue to use anyway.  You need drugs to relate to people or feel comfortable in social situations.  You use drugs to forget problems. "Yes" answered to any of the above signs of  chemical dependency indicates there are problems. The longer the use of drugs continues, the greater the problems will become. If there is a family history of drug or alcohol use, it is best not to experiment with these drugs. Continual use leads to tolerance. After tolerance develops more of the drug is needed to get the same feeling. This is followed by addiction. With addiction, drugs become the most important part of life. It becomes more important to take drugs than participate in the other usual activities of life. This includes relating to friends and family. Addiction is followed by dependency. Dependency is a condition where drugs are now needed not just to get high, but to feel normal. Addiction cannot be cured but it can be stopped. This often requires outside help and the care of professionals. Treatment centers are listed in the yellow pages under: Cocaine, Narcotics, and Alcoholics Anonymous. Most hospitals and clinics can refer you to a specialized care center. Talk to your caregiver if you need help. Document Released: 08/27/2004 Document Revised: 03/30/2011 Document Reviewed: 01/05/2005 [facility] Patient Information 2015 [place], [place]. This information is not intended to replace advice given to you by your health care provider. Make sure you discuss any questions you have with your health care provider.

## 2013-07-20 NOTE — Discharge Summary (Signed)
Physician Discharge Summary  [name] YWV:[id number] DOB: [birth year] DOA: 07/18/2013  PCP: Neale Burly, MD  Admit date: 07/18/2013 Discharge date: 07/20/2013  Time spent: 45 minutes  Recommendations for Outpatient Follow-up:  Patient will be discharged to home.  He is to follow up with his primary care physician within one week.  He is also to follow up with Dr. Sharol Given within 2 weeks.  Patient should continue taking his medications as prescribed.   He was counseled regarding smoking cessation and polysubstance abuse.   Discharge Diagnoses:  Principal Problem:   Cellulitis of right leg Active Problems:   Neuropathic ulcer of foot   Substance abuse   Generalized anxiety disorder   Cellulitis   Cellulitis of right lower extremity   Discharge Condition: Stable  Diet recommendation: Regular  Filed Weights   07/18/13 1815 07/18/13 2231  Weight: 99.791 kg (220 lb) 98.793 kg (217 lb 12.8 oz)    History of present illness:  48 yo male with a history of polysubstance abuse, prediabetes, injured his right foot /fractured toe last week. He had buddy taped two of his toes together, and several days later ripped the tape off. When he took the tape off, his skin was removed on the bottom of his foot. By the next day this area started getting red, and he developed some blisters which then popped. Since then, the redness has spread to his entire foot up to above his ankle and now he has a streak going up his right inner thigh up above his knee. He complains of pain and swelling. He has had subjective fevers and chills. No recent antibiotics. He denies any IV drug abuse.  Hospital Course:  Right lower extremity cellulitis  -Initially placed on vancomycin, and cellulitis improved -Blood cultures negative to date.  -Consult wound care, recommended to follow up with Dr. Sharol Given as an outpatient.  -Right Lower extremity Doppler was negative for DVT -Patient will be discharged with clindamycin    Polysubstance abuse  -Toxicology screen was positive for benzodiazepines, opiates, cocaine  -Patient was counseled   Nicotine abuse  -Continue nicotine patch, patient counseled   Depression/Anxiety/Insomnia  -Continue Klonopin, Lexapro, Remeron, Seroquel   Neuropathic pain  -Continue gabapentin   Procedures: Right lower extremity Doppler  IMPRESSION: No evidence of right lower extremity deep venous thrombosis. Very limited visualization and ability to compress the calf veins.  Consultations: Wound care  Discharge Exam: Filed Vitals:   07/20/13 0434  BP: 136/93  Pulse: 70  Temp: 97.7 F (36.5 C)  Resp: 20   Exam  General: Well developed, well nourished, NAD, appears stated age  HEENT: NCAT, mucous membranes moist.  Neck: Supple, no JVD, no masses  Cardiovascular: S1 S2 auscultated, no rubs, murmurs or gallops. Regular rate and rhythm.  Respiratory: Clear to auscultation bilaterally with equal chest rise  Abdomen: Soft, nontender, nondistended, + bowel sounds  Extremities: warm dry without cyanosis clubbing. RLE edema and erythema- improving;  ucler on medial aspect of right heel, plantar surface of great toe- no drainage.  Neuro: AAOx3, no focal deficits Skin: Without rashes exudates or nodules  Psych: Normal affect and demeanor with intact judgement and insight  Discharge Instructions      Discharge Instructions   Discharge instructions    Complete by:  As directed   Patient will be discharged to home.  He is to follow up with his primary care physician within one week.  He is also to follow up with Dr. Sharol Given within 2  weeks.  Patient should continue taking his medications as prescribed.   He was counseled regarding smoking cessation and polysubstance abuse.     Increase activity slowly    Complete by:  As directed             Medication List         clindamycin 300 MG capsule  Commonly known as:  CLEOCIN  Take 1 capsule (300 mg total) by mouth 3 (three)  times daily.     clonazePAM 1 MG tablet  Commonly known as:  KLONOPIN  Take 1 mg by mouth 3 (three) times daily.     escitalopram 20 MG tablet  Commonly known as:  LEXAPRO  Take 1 tablet (20 mg total) by mouth every morning. For depression     gabapentin 400 MG capsule  Commonly known as:  NEURONTIN  Take 1 capsule (400 mg total) by mouth 4 (four) times daily. For substance withdrawal syndrome     ibuprofen 600 MG tablet  Commonly known as:  ADVIL,MOTRIN  Take 1 tablet (600 mg total) by mouth every 6 (six) hours as needed for moderate pain.     mirtazapine 30 MG tablet  Commonly known as:  REMERON  Take 30 mg by mouth at bedtime.     nicotine 21 mg/24hr patch  Commonly known as:  NICODERM CQ - dosed in mg/24 hours  Place 1 patch (21 mg total) onto the skin daily.     oxyCODONE-acetaminophen 5-325 MG per tablet  Commonly known as:  PERCOCET/ROXICET  Take 1 tablet by mouth every 4 (four) hours as needed.     QUEtiapine 200 MG tablet  Commonly known as:  SEROQUEL  Take 1 tablet (200 mg total) by mouth at bedtime. For mood control     silver sulfADIAZINE 1 % cream  Commonly known as:  SILVADENE  Apply topically 2 (two) times daily.       Allergies  Allergen Reactions  . Benadryl [Diphenhydramine Hcl] Other (See Comments)    Pt states leg spasm  . Trazodone And Nefazodone Other (See Comments)    Leg Spasms.       The results of significant diagnostics from this hospitalization (including imaging, microbiology, ancillary and laboratory) are listed below for reference.    Significant Diagnostic Studies: Dg Hip Complete Right  06/30/2013   CLINICAL DATA:  Fall.  Right hip pain.  EXAM: RIGHT HIP - COMPLETE 2+ VIEW  COMPARISON:  05/11/2013  FINDINGS: Changes of right hip replacement are stable. No hardware or bony complicating feature. Moderate degenerative changes in the left hip. SI joints are symmetric and unremarkable.  IMPRESSION: No acute bony abnormality.  Prior  right hip replacement.   Electronically Signed   By: Rolm Baptise M.D.   On: 06/30/2013 16:37   Dg Ankle Complete Left  06/30/2013   CLINICAL DATA:  Fall.  Ankle pain.  EXAM: LEFT ANKLE COMPLETE - 3+ VIEW  COMPARISON:  04/19/2011  FINDINGS: No acute bony abnormality. Specifically, no fracture, subluxation, or dislocation. Soft tissues are intact. Joint spaces are maintained. Normal bone mineralization.  IMPRESSION: No acute bony abnormality.   Electronically Signed   By: Rolm Baptise M.D.   On: 06/30/2013 16:44   US Venous Img Lower Unilateral Right  07/19/2013   CLINICAL DATA:  Right lower extremity pain and edema and diabetic ulcer.  EXAM: RIGHT LOWER EXTREMITY VENOUS DOPPLER ULTRASOUND  TECHNIQUE: Gray-scale sonography with graded compression, as well as color Doppler and duplex ultrasound  were performed to evaluate the lower extremity deep venous systems from the level of the common femoral vein and including the common femoral, femoral, profunda femoral, popliteal and calf veins including the posterior tibial, peroneal and gastrocnemius veins when visible. The superficial great saphenous vein was also interrogated. Spectral Doppler was utilized to evaluate flow at rest and with distal augmentation maneuvers in the common femoral, femoral and popliteal veins.  COMPARISON:  None.  FINDINGS: Common Femoral Vein: No evidence of thrombus. Normal compressibility, respiratory phasicity and response to augmentation.  Saphenofemoral Junction: No evidence of thrombus. Normal compressibility and flow on color Doppler imaging.  Profunda Femoral Vein: No evidence of thrombus. Normal compressibility and flow on color Doppler imaging.  Femoral Vein: No evidence of thrombus. Normal compressibility, respiratory phasicity and response to augmentation.  Popliteal Vein: No evidence of thrombus. Normal compressibility, respiratory phasicity and response to augmentation.  Calf Veins: The tibial veins are very poorly visualized.  Only a limited portion of the posterior tibial and proximal peroneal veins could be seen. These were not able to be well compressed do de limited visualization.  Superficial Great Saphenous Vein: No evidence of thrombus. Normal compressibility and flow on color Doppler imaging.  Venous Reflux:  None.  Other Findings: Diffuse subcutaneous edema is identified, especially in the lower calf and ankle region. There is no evidence of superficial thrombophlebitis. No focal fluid collections are identified. Incidental note is made of some mildly prominent right-sided inguinal lymph nodes with the largest measuring 1.9 cm in long axis.  IMPRESSION: No evidence of right lower extremity deep venous thrombosis. Very limited visualization and ability to compress the calf veins.   Electronically Signed   By: Aletta Edouard M.D.   On: 07/19/2013 13:16   Dg Knee Complete 4 Views Right  06/30/2013   CLINICAL DATA:  Fall.  EXAM: RIGHT KNEE - COMPLETE 4+ VIEW  COMPARISON:  05/11/2013  FINDINGS: The right total arthroplasty is intact and unchanged. There is no acute fracture dislocation. No significant joint effusion.  IMPRESSION: No acute findings.   Electronically Signed   By: Marin Olp M.D.   On: 06/30/2013 16:42   Dg Foot Complete Left  06/30/2013   CLINICAL DATA:  Fall.  EXAM: LEFT FOOT - COMPLETE 3+ VIEW  COMPARISON:  11/15/2009  FINDINGS: There is an orthopedic screw extending from the first metatarsal into the region of the middle cuneiform bone bridging the tarsal metatarsal joint. There are mild degenerative changes over the midfoot. There is no acute fracture dislocation.  IMPRESSION: No acute findings.   Electronically Signed   By: Marin Olp M.D.   On: 06/30/2013 16:45   Dg Foot Complete Right  07/12/2013   CLINICAL DATA:  Right foot pain  EXAM: RIGHT FOOT COMPLETE - 3+ VIEW  COMPARISON:  None.  FINDINGS: Mild hallux valgus deformity is noted. There is a mildly displaced fracture of the third proximal  phalanx. No other fracture is noted. No gross soft tissue abnormality is seen.  IMPRESSION: Fracture of the third proximal phalanx.   Electronically Signed   By: Inez Catalina M.D.   On: 07/12/2013 14:44    Microbiology: Recent Results (from the past 240 hour(s))  CULTURE, BLOOD (ROUTINE X 2)     Status: None   Collection Time    07/18/13  7:25 PM      Result Value Ref Range Status   Specimen Description BLOOD LEFT ARM   Final   Special Requests  Final   Value: BOTTLES DRAWN AEROBIC AND ANAEROBIC AEB=12CC ANA=10CC   Culture  Setup Time     Final   Value: 07/19/2013 14:13     Performed at [facility]   Culture     Final   Value:        BLOOD CULTURE RECEIVED NO GROWTH TO DATE CULTURE WILL BE HELD FOR 5 DAYS BEFORE ISSUING A FINAL NEGATIVE REPORT     Performed at [facility]   Report Status PENDING   Incomplete  CULTURE, BLOOD (ROUTINE X 2)     Status: None   Collection Time    07/18/13  8:00 PM      Result Value Ref Range Status   Specimen Description BLOOD RIGHT ARM   Final   Special Requests     Final   Value: BOTTLES DRAWN AEROBIC AND ANAEROBIC AEB=10CC ANA=12CC   Culture  Setup Time     Final   Value: 07/19/2013 14:14     Performed at [facility]   Culture     Final   Value:        BLOOD CULTURE RECEIVED NO GROWTH TO DATE CULTURE WILL BE HELD FOR 5 DAYS BEFORE ISSUING A FINAL NEGATIVE REPORT     Performed at [facility]   Report Status PENDING   Incomplete     Labs: Basic Metabolic Panel:  Recent Labs Lab 07/18/13 1925 07/19/13 0535  NA 135* 140  K 3.5* 4.0  CL 98 102  CO2 27 29  GLUCOSE 122* 108*  BUN 7 9  CREATININE 0.88 1.00  CALCIUM 8.6 8.6   Liver Function Tests:  Recent Labs Lab 07/18/13 1925  AST 18  ALT 12  ALKPHOS 108  BILITOT 0.3  PROT 6.8  ALBUMIN 3.5   No results found for this basename: LIPASE, AMYLASE,  in the last 168 hours No results found for this basename: AMMONIA,  in the last 168  hours CBC:  Recent Labs Lab 07/18/13 1925 07/19/13 0535  WBC 6.8 5.7  NEUTROABS 4.3  --   HGB 13.3 13.2  HCT 40.3 40.8  MCV 79.8 80.6  PLT 157 166   Cardiac Enzymes: No results found for this basename: CKTOTAL, CKMB, CKMBINDEX, TROPONINI,  in the last 168 hours BNP: BNP (last 3 results) No results found for this basename: PROBNP,  in the last 8760 hours CBG:  Recent Labs Lab 07/18/13 2254 07/19/13 0744  GLUCAP 104* 100*       Signed:  Cordie Palmer  [facility] 07/20/2013, 10:38 AM

## 2013-07-25 LAB — CULTURE, BLOOD (ROUTINE X 2)
CULTURE: NO GROWTH
Culture: NO GROWTH

## 2013-10-04 ENCOUNTER — Emergency Department (HOSPITAL_COMMUNITY): Payer: Medicare Other

## 2013-10-04 ENCOUNTER — Emergency Department (HOSPITAL_COMMUNITY)
Admission: EM | Admit: 2013-10-04 | Discharge: 2013-10-04 | Disposition: A | Discharge status: Home / Self Care | Payer: Medicare Other | Attending: Emergency Medicine | Admitting: Emergency Medicine

## 2013-10-04 ENCOUNTER — Encounter (HOSPITAL_COMMUNITY): Payer: Self-pay | Admitting: Emergency Medicine

## 2013-10-04 DIAGNOSIS — F17200 Nicotine dependence, unspecified, uncomplicated: Secondary | ICD-10-CM | POA: Insufficient documentation

## 2013-10-04 DIAGNOSIS — W11XXXA Fall on and from ladder, initial encounter: Secondary | ICD-10-CM

## 2013-10-04 DIAGNOSIS — IMO0002 Reserved for concepts with insufficient information to code with codable children: Secondary | ICD-10-CM | POA: Diagnosis present

## 2013-10-04 DIAGNOSIS — Z79899 Other long term (current) drug therapy: Secondary | ICD-10-CM | POA: Insufficient documentation

## 2013-10-04 DIAGNOSIS — M25561 Pain in right knee: Secondary | ICD-10-CM

## 2013-10-04 DIAGNOSIS — M79672 Pain in left foot: Secondary | ICD-10-CM

## 2013-10-04 DIAGNOSIS — M79641 Pain in right hand: Secondary | ICD-10-CM

## 2013-10-04 DIAGNOSIS — G8929 Other chronic pain: Secondary | ICD-10-CM | POA: Insufficient documentation

## 2013-10-04 DIAGNOSIS — Z792 Long term (current) use of antibiotics: Secondary | ICD-10-CM | POA: Diagnosis not present

## 2013-10-04 DIAGNOSIS — M069 Rheumatoid arthritis, unspecified: Secondary | ICD-10-CM | POA: Insufficient documentation

## 2013-10-04 DIAGNOSIS — D649 Anemia, unspecified: Secondary | ICD-10-CM | POA: Diagnosis not present

## 2013-10-04 DIAGNOSIS — M79604 Pain in right leg: Secondary | ICD-10-CM

## 2013-10-04 MED ORDER — IBUPROFEN 800 MG PO TABS
800.0000 mg | ORAL_TABLET | Freq: Once | ORAL | Status: AC
  Administered 2013-10-04: 800 mg via ORAL
  Filled 2013-10-04: qty 1

## 2013-10-04 MED ORDER — IBUPROFEN 800 MG PO TABS: 800.0000 mg | ORAL_TABLET | Freq: Three times a day (TID) | ORAL | Status: DC

## 2013-10-04 NOTE — ED Notes (Signed)
[place] today - c/o pain to right hip, right thigh, right knee, right hand, and left foot.

## 2013-10-04 NOTE — ED Notes (Signed)
Pt fell 6 feet from a ladder today, denies hitting head, c/o right hip, right leg and left foot pain

## 2013-10-04 NOTE — ED Provider Notes (Signed)
CSN: [id number]     Arrival date & time 10/04/13  1430 History   First MD Initiated Contact with Patient 10/04/13 [place]     Chief Complaint  Patient presents with  . Fall     (Consider location/radiation/quality/duration/timing/severity/associated sxs/prior Treatment) HPI Comments: Patient is a 48 year old male with a past medical history of alcohol abuse, anxiety, GERD, arthritis, mental disorder, depression and degenerative disc disease who presents to the emergency department complaining of right upper leg, right knee, right hand and left foot pain after slipping and falling off of a 6 foot ladder around 1:30 PM today. Patient reports he slipped and landed onto his right side, and "somehow my left foot got hit". Denies hitting his head or loss of consciousness. Pain has been constant since, worse with movement, relieved when he lays still. He has not had to take any medications for his symptoms. He reports he believes Percocet will make his pain feel better as this usually helps his pain. Pain currently 9/10. Denies any swelling. Denies chest pain, shortness of breath, back pain, neck pain, abdominal pain, headache or dizziness.  The history is provided by the patient.    Past Medical History  Diagnosis Date  . Diabetic foot ulcer   . ETOH abuse   . Anxiety   . Open wound     bottom of foot  . Diabetes mellitus without complication     borderline  . Neuromuscular disorder     neuropathy  . GERD (gastroesophageal reflux disease)     tums  . Pneumonia ~ 2012  . History of blood transfusion     "related to left knee OR; probably right hip too" (04/21/2012)  . Stroke 2008    "they said I might have had one during right hip replacement" (04/21/2012)  . Arthritis     "everywhere" (04/21/2012)  . Mental disorder   . Depression   . DDD (degenerative disc disease)    Past Surgical History  Procedure Laterality Date  . Total hip arthroplasty Right 2008  . Total knee arthroplasty Left 2006   . Joint replacement    . Lung lobectomy Left ~ 2006  . Revision total hip arthroplasty Right 2008    "4-5 months after replacement" (04/21/2012)  . Knee arthroscopy Bilateral 1980's/1990's  . Lung lobectomy    . Metatarsal osteotomy  10/29/2011    Procedure: METATARSAL OSTEOTOMY;  Surgeon: [name], MD;  Location: [place];  Service: Orthopedics;  Laterality: Left;  Left 1st Metatarsal Dorsal Closing Wedge   . Total knee arthroplasty Right 04/20/2012  . Total knee arthroplasty Right 04/20/2012    Procedure: TOTAL KNEE ARTHROPLASTY;  Surgeon: [name], MD;  Location: [place];  Service: Orthopedics;  Laterality: Right;  Right Total Knee Arthroplasty   No family history on file. History  Substance Use Topics  . Smoking status: Current Every Day Smoker -- 1.00 packs/day for 30 years    Types: Cigarettes  . Smokeless tobacco: Never Used     Comment: 04/21/2012 offered smoking cessation materials; pt declines  . Alcohol Use: No     Comment: last used march 11    Review of Systems  Musculoskeletal:       + R upper leg, knee, and hand pain. + L foot pain.  Neurological: Negative for numbness.  All other systems reviewed and are negative.     Allergies  Benadryl and Trazodone and nefazodone  Home Medications   Prior to Admission medications  Medication Sig Start Date End Date Taking? Authorizing Provider  clindamycin (CLEOCIN) 300 MG capsule Take 1 capsule (300 mg total) by mouth 3 (three) times daily. 07/20/13   Maryann Mikhail, DO  clonazePAM (KLONOPIN) 1 MG tablet Take 1 mg by mouth 3 (three) times daily.    Historical Provider, MD  escitalopram (LEXAPRO) 20 MG tablet Take 1 tablet (20 mg total) by mouth every morning. For depression 03/29/13   Encarnacion Slates, NP  gabapentin (NEURONTIN) 400 MG capsule Take 1 capsule (400 mg total) by mouth 4 (four) times daily. For substance withdrawal syndrome 03/29/13   Encarnacion Slates, NP  ibuprofen (ADVIL,MOTRIN) 600 MG tablet Take 1 tablet (600 mg  total) by mouth every 6 (six) hours as needed for moderate pain. 07/12/13   Evalee Jefferson, PA-C  ibuprofen (ADVIL,MOTRIN) 800 MG tablet Take 1 tablet (800 mg total) by mouth 3 (three) times daily. 10/04/13   Illene Labrador, PA-C  mirtazapine (REMERON) 30 MG tablet Take 30 mg by mouth at bedtime.    Historical Provider, MD  nicotine (NICODERM CQ - DOSED IN MG/24 HOURS) 21 mg/24hr patch Place 1 patch (21 mg total) onto the skin daily. 07/20/13   Maryann Mikhail, DO  oxyCODONE-acetaminophen (PERCOCET/ROXICET) 5-325 MG per tablet Take 1 tablet by mouth every 4 (four) hours as needed. 07/20/13   Maryann Mikhail, DO  QUEtiapine (SEROQUEL) 200 MG tablet Take 1 tablet (200 mg total) by mouth at bedtime. For mood control 03/29/13   Encarnacion Slates, NP  silver sulfADIAZINE (SILVADENE) 1 % cream Apply topically 2 (two) times daily. 07/20/13   Maryann Mikhail, DO   BP 136/84  Pulse 79  Temp(Src) 98.2 F (36.8 C) (Oral)  Resp 16  Ht 6\' 3"  (1.905 m)  Wt 220 lb (99.791 kg)  BMI 27.50 kg/m2  SpO2 98% Physical Exam  Nursing note and vitals reviewed. Constitutional: He is oriented to person, place, and time. He appears well-developed and well-nourished. No distress.  HENT:  Head: Normocephalic and atraumatic.  Mouth/Throat: Oropharynx is clear and moist.  Eyes: Conjunctivae are normal.  Neck: Normal range of motion. Neck supple.  Cardiovascular: Normal rate, regular rhythm, normal heart sounds and intact distal pulses.   Pulmonary/Chest: Effort normal and breath sounds normal.  Abdominal: Soft. Bowel sounds are normal. There is no tenderness.  Musculoskeletal: Normal range of motion. He exhibits no edema.  Mild tenderness to palpation lateral right thigh. No overlying bruising or signs of trauma. Full range of motion right hip. Minimal tenderness around right patella. Full right knee range of motion. No deformity. Right hand tender over fifth metacarpal without swelling. Full range of motion of right hand and wrist  without pain. Left foot nontender. Able to wiggle toes without difficulty. Full range of motion of left ankle without pain. No swelling.  Neurological: He is alert and oriented to person, place, and time.  Skin: Skin is warm and dry. He is not diaphoretic.  No bruising or signs of trauma.  Psychiatric: He has a normal mood and affect. His behavior is normal.    ED Course  Procedures (including critical care time) Labs Review Labs Reviewed - No data to display  Imaging Review Dg Femur Right  10/04/2013   CLINICAL DATA:  Fall 6 feet from ladder, right leg pain. Right total hip arthroplasty 2008 and right knee arthroplasty 2013.  EXAM: RIGHT FEMUR - 2 VIEW  COMPARISON:  06/30/2013 hip radiographs  FINDINGS: There is no evidence of fracture or other  focal bone lesions. Soft tissues are unremarkable. Right total hip and knee arthroplasties partly visualized. No visualized evidence for hardware failure.  IMPRESSION: No acute osseous abnormality of the right femur.   Electronically Signed   By: Conchita Paris M.D.   On: 10/04/2013 16:10   Dg Knee Complete 4 Views Right  10/04/2013   CLINICAL DATA:  Fall, right knee pain  EXAM: RIGHT KNEE - COMPLETE 4+ VIEW  COMPARISON:  06/30/2013  FINDINGS: There is no evidence of fracture or dislocation. Trace suprapatellar effusion reidentified. There is no evidence of arthropathy or other focal bone abnormality. Soft tissues are unremarkable. Evidence of right total knee arthroplasty without hardware failure evident.  IMPRESSION: Negative.   Electronically Signed   By: Conchita Paris M.D.   On: 10/04/2013 16:11   Dg Hand Complete Right  10/04/2013   CLINICAL DATA:  Right hand pain at the level of the third and fourth metacarpals, fall 6 feet off ladder  EXAM: RIGHT HAND - COMPLETE 3+ VIEW  COMPARISON:  03/02/2012  FINDINGS: No fracture or dislocation. Degenerative changes at the first carpometacarpal joint. Tip of the thumb is incompletely imaged on the oblique  view. Mild degenerative change at the radiocarpal articulation. No radiopaque foreign body. Minimal deformity at the base of the third metacarpal is stable.  IMPRESSION: No acute osseous abnormality.   Electronically Signed   By: Conchita Paris M.D.   On: 10/04/2013 16:13   Dg Foot Complete Left  10/04/2013   CLINICAL DATA:  Fall 6 feet off ladder, left foot pain  EXAM: LEFT FOOT - COMPLETE 3+ VIEW  COMPARISON:  06/30/2013  FINDINGS: Screw fixation of the first metatarsal to the midfoot is noted. Flexion at the second toe DIP joint reidentified. No fracture or dislocation. Minimal plantar calcaneal spurring.  IMPRESSION: No acute osseous abnormality of the left foot.   Electronically Signed   By: Conchita Paris M.D.   On: 10/04/2013 16:15     EKG Interpretation None      MDM   Final diagnoses:  Fall from ladder, initial encounter  Left foot pain  Right leg pain  Right knee pain  Right hand pain   Patient presenting with pain as stated above after fall. Neurovascularly intact. Afebrile, vital signs stable. X-rays obtained prior to patient being seen, no acute findings. After discussion of treatment with ice and NSAIDs, he asks "can't you just give me Percocet, as always helps my pain". I discussed with him that this is not appropriate treatment at this time. He then states "well then give me a shot before  I go". I told patient that he could receive ibuprofen prior to discharge. Patient upset with this treatment, however eventually states his understanding. Return precautions given. Stable for d/c. F/u with PCP.  Illene Labrador, PA-C 10/04/13 [phone]

## 2013-10-04 NOTE — ED Notes (Signed)
Pt ambulatory with cane upon discharge, Alert and oriented x 3. NAD noted. Verbalized d/c instructions with prescriptions.

## 2013-10-04 NOTE — Discharge Instructions (Signed)
Take ibuprofen as directed. Apply ice to the areas you are sore.  Musculoskeletal Pain Musculoskeletal pain is muscle and boney aches and pains. These pains can occur in any part of the body. Your caregiver may treat you without knowing the cause of the pain. They may treat you if blood or urine tests, X-rays, and other tests were normal.  CAUSES There is often not a definite cause or reason for these pains. These pains may be caused by a type of germ (virus). The discomfort may also come from overuse. Overuse includes working out too hard when your body is not fit. Boney aches also come from weather changes. Bone is sensitive to atmospheric pressure changes. HOME CARE INSTRUCTIONS   Ask when your test results will be ready. Make sure you get your test results.  Only take over-the-counter or prescription medicines for pain, discomfort, or fever as directed by your caregiver. If you were given medications for your condition, do not drive, operate machinery or power tools, or sign legal documents for 24 hours. Do not drink alcohol. Do not take sleeping pills or other medications that may interfere with treatment.  Continue all activities unless the activities cause more pain. When the pain lessens, slowly resume normal activities. Gradually increase the intensity and duration of the activities or exercise.  During periods of severe pain, bed rest may be helpful. Lay or sit in any position that is comfortable.  Putting ice on the injured area.  Put ice in a bag.  Place a towel between your skin and the bag.  Leave the ice on for 15 to 20 minutes, 3 to 4 times a day.  Follow up with your caregiver for continued problems and no reason can be found for the pain. If the pain becomes worse or does not go away, it may be necessary to repeat tests or do additional testing. Your caregiver may need to look further for a possible cause. SEEK IMMEDIATE MEDICAL CARE IF:  You have pain that is getting  worse and is not relieved by medications.  You develop chest pain that is associated with shortness or breath, sweating, feeling sick to your stomach (nauseous), or throw up (vomit).  Your pain becomes localized to the abdomen.  You develop any new symptoms that seem different or that concern you. MAKE SURE YOU:   Understand these instructions.  Will watch your condition.  Will get help right away if you are not doing well or get worse. Document Released: 01/05/2005 Document Revised: 03/30/2011 Document Reviewed: 09/09/2012 [facility] Patient Information 2015 [place], [place]. This information is not intended to replace advice given to you by your health care provider. Make sure you discuss any questions you have with your health care provider.

## 2013-10-05 NOTE — ED Provider Notes (Signed)
Medical screening examination/treatment/procedure(s) were performed by non-physician practitioner and as supervising physician I was immediately available for consultation/collaboration.   EKG Interpretation None        Fredia Sorrow, MD 10/05/13 [phone]
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
## 2013-11-21 ENCOUNTER — Encounter (HOSPITAL_COMMUNITY): Payer: Self-pay | Admitting: *Deleted

## 2013-11-21 ENCOUNTER — Emergency Department (HOSPITAL_COMMUNITY)
Admission: EM | Admit: 2013-11-21 | Discharge: 2013-11-21 | Disposition: A | Discharge status: Home / Self Care | Payer: Medicare Other | Attending: Emergency Medicine | Admitting: Emergency Medicine

## 2013-11-21 DIAGNOSIS — E11621 Type 2 diabetes mellitus with foot ulcer: Secondary | ICD-10-CM | POA: Insufficient documentation

## 2013-11-21 DIAGNOSIS — Z8673 Personal history of transient ischemic attack (TIA), and cerebral infarction without residual deficits: Secondary | ICD-10-CM | POA: Diagnosis not present

## 2013-11-21 DIAGNOSIS — F419 Anxiety disorder, unspecified: Secondary | ICD-10-CM | POA: Diagnosis not present

## 2013-11-21 DIAGNOSIS — Z792 Long term (current) use of antibiotics: Secondary | ICD-10-CM | POA: Diagnosis not present

## 2013-11-21 DIAGNOSIS — Z8719 Personal history of other diseases of the digestive system: Secondary | ICD-10-CM | POA: Diagnosis not present

## 2013-11-21 DIAGNOSIS — S39012A Strain of muscle, fascia and tendon of lower back, initial encounter: Secondary | ICD-10-CM | POA: Diagnosis not present

## 2013-11-21 DIAGNOSIS — Y9389 Activity, other specified: Secondary | ICD-10-CM | POA: Diagnosis not present

## 2013-11-21 DIAGNOSIS — L97509 Non-pressure chronic ulcer of other part of unspecified foot with unspecified severity: Secondary | ICD-10-CM | POA: Insufficient documentation

## 2013-11-21 DIAGNOSIS — Z8701 Personal history of pneumonia (recurrent): Secondary | ICD-10-CM | POA: Diagnosis not present

## 2013-11-21 DIAGNOSIS — M1990 Unspecified osteoarthritis, unspecified site: Secondary | ICD-10-CM | POA: Diagnosis not present

## 2013-11-21 DIAGNOSIS — S3992XA Unspecified injury of lower back, initial encounter: Secondary | ICD-10-CM | POA: Diagnosis present

## 2013-11-21 DIAGNOSIS — Z8669 Personal history of other diseases of the nervous system and sense organs: Secondary | ICD-10-CM | POA: Insufficient documentation

## 2013-11-21 DIAGNOSIS — X58XXXA Exposure to other specified factors, initial encounter: Secondary | ICD-10-CM | POA: Insufficient documentation

## 2013-11-21 DIAGNOSIS — Z720 Tobacco use: Secondary | ICD-10-CM | POA: Insufficient documentation

## 2013-11-21 DIAGNOSIS — Y9289 Other specified places as the place of occurrence of the external cause: Secondary | ICD-10-CM | POA: Diagnosis not present

## 2013-11-21 DIAGNOSIS — F329 Major depressive disorder, single episode, unspecified: Secondary | ICD-10-CM | POA: Insufficient documentation

## 2013-11-21 DIAGNOSIS — T148XXA Other injury of unspecified body region, initial encounter: Secondary | ICD-10-CM

## 2013-11-21 MED ORDER — METHOCARBAMOL 500 MG PO TABS
500.0000 mg | ORAL_TABLET | Freq: Three times a day (TID) | ORAL | Status: DC
Start: 2013-11-21 — End: 2014-01-01

## 2013-11-21 MED ORDER — DIAZEPAM 5 MG PO TABS
10.0000 mg | ORAL_TABLET | Freq: Once | ORAL | Status: AC
  Administered 2013-11-21: 10 mg via ORAL
  Filled 2013-11-21: qty 2

## 2013-11-21 MED ORDER — ACETAMINOPHEN 500 MG PO TABS
1000.0000 mg | ORAL_TABLET | Freq: Once | ORAL | Status: AC
  Administered 2013-11-21: 1000 mg via ORAL
  Filled 2013-11-21: qty 2

## 2013-11-21 NOTE — ED Notes (Signed)
Pt medicated, stated that he is not driving.

## 2013-11-21 NOTE — ED Notes (Signed)
Pt states helped a friend move a washing machine yesterday, pt states cramping pain to right thigh and lower back pain since yesterday

## 2013-11-21 NOTE — Discharge Instructions (Signed)

## 2013-11-21 NOTE — ED Provider Notes (Signed)
CSN: [id number]     Arrival date & time 11/21/13  1314 History   First MD Initiated Contact with Patient 11/21/13 1439     Chief Complaint  Patient presents with  . Back Pain     (Consider location/radiation/quality/duration/timing/severity/associated sxs/prior Treatment) Patient is a 48 y.o. male presenting with back pain. The history is provided by the patient.  Back Pain Location:  Lumbar spine Quality:  Aching and cramping Radiates to:  R thigh Pain severity:  Moderate Pain is:  Same all the time Onset quality:  Gradual Duration:  1 day Timing:  Intermittent Progression:  Worsening Chronicity: acute on chronic. Context: lifting heavy objects   Context comment:  Hx of ddd Relieved by:  Nothing Ineffective treatments:  Bed rest Associated symptoms: no abdominal pain, no bladder incontinence, no bowel incontinence, no chest pain, no dysuria, no numbness, no paresthesias and no perianal numbness     Past Medical History  Diagnosis Date  . Diabetic foot ulcer   . ETOH abuse   . Anxiety   . Open wound     bottom of foot  . Diabetes mellitus without complication     borderline  . Neuromuscular disorder     neuropathy  . GERD (gastroesophageal reflux disease)     tums  . Pneumonia ~ 2012  . History of blood transfusion     "related to left knee OR; probably right hip too" (04/21/2012)  . Stroke 2008    "they said I might have had one during right hip replacement" (04/21/2012)  . Arthritis     "everywhere" (04/21/2012)  . Mental disorder   . Depression   . DDD (degenerative disc disease)    Past Surgical History  Procedure Laterality Date  . Total hip arthroplasty Right 2008  . Total knee arthroplasty Left 2006  . Joint replacement    . Lung lobectomy Left ~ 2006  . Revision total hip arthroplasty Right 2008    "4-5 months after replacement" (04/21/2012)  . Knee arthroscopy Bilateral 1980's/1990's  . Lung lobectomy    . Metatarsal osteotomy  10/29/2011    Procedure:  METATARSAL OSTEOTOMY;  Surgeon: [name], MD;  Location: [place];  Service: Orthopedics;  Laterality: Left;  Left 1st Metatarsal Dorsal Closing Wedge   . Total knee arthroplasty Right 04/20/2012  . Total knee arthroplasty Right 04/20/2012    Procedure: TOTAL KNEE ARTHROPLASTY;  Surgeon: [name], MD;  Location: [facility];  Service: Orthopedics;  Laterality: Right;  Right Total Knee Arthroplasty   History reviewed. No pertinent family history. History  Substance Use Topics  . Smoking status: Current Every Day Smoker -- 1.00 packs/day for 30 years    Types: Cigarettes  . Smokeless tobacco: Never Used     Comment: 04/21/2012 offered smoking cessation materials; pt declines  . Alcohol Use: No     Comment: last used march 11    Review of Systems  Constitutional: Negative for activity change.       All ROS Neg except as noted in HPI  Eyes: Negative for photophobia and discharge.  Respiratory: Negative for cough, shortness of breath and wheezing.   Cardiovascular: Negative for chest pain and palpitations.  Gastrointestinal: Negative for abdominal pain, blood in stool and bowel incontinence.  Genitourinary: Negative for bladder incontinence, dysuria, frequency and hematuria.  Musculoskeletal: Positive for back pain and arthralgias. Negative for neck pain.  Skin: Negative.   Neurological: Negative for dizziness, seizures, speech difficulty, numbness and paresthesias.  Psychiatric/Behavioral:  Negative for hallucinations and confusion.      Allergies  Benadryl and Trazodone and nefazodone  Home Medications   Prior to Admission medications   Medication Sig Start Date End Date Taking? Authorizing Provider  escitalopram (LEXAPRO) 20 MG tablet Take 1 tablet (20 mg total) by mouth every morning. For depression 03/29/13  Yes Encarnacion Slates, NP  gabapentin (NEURONTIN) 400 MG capsule Take 1 capsule (400 mg total) by mouth 4 (four) times daily. For substance withdrawal syndrome 03/29/13  Yes Encarnacion Slates, NP  ibuprofen (ADVIL,MOTRIN) 200 MG tablet Take 400 mg by mouth every 6 (six) hours as needed for moderate pain.   Yes Historical Provider, MD  mirtazapine (REMERON) 30 MG tablet Take 30 mg by mouth at bedtime.   Yes Historical Provider, MD  QUEtiapine (SEROQUEL) 200 MG tablet Take 1 tablet (200 mg total) by mouth at bedtime. For mood control 03/29/13  Yes Encarnacion Slates, NP  clindamycin (CLEOCIN) 300 MG capsule Take 1 capsule (300 mg total) by mouth 3 (three) times daily. Patient not taking: Reported on 11/21/2013 07/20/13   Velta Addison Mikhail, DO  ibuprofen (ADVIL,MOTRIN) 600 MG tablet Take 1 tablet (600 mg total) by mouth every 6 (six) hours as needed for moderate pain. Patient not taking: Reported on 11/21/2013 07/12/13   Evalee Jefferson, PA-C  ibuprofen (ADVIL,MOTRIN) 800 MG tablet Take 1 tablet (800 mg total) by mouth 3 (three) times daily. Patient not taking: Reported on 11/21/2013 10/04/13   Carman Ching, PA-C  nicotine (NICODERM CQ - DOSED IN MG/24 HOURS) 21 mg/24hr patch Place 1 patch (21 mg total) onto the skin daily. Patient not taking: Reported on 11/21/2013 07/20/13   Velta Addison Mikhail, DO  oxyCODONE-acetaminophen (PERCOCET/ROXICET) 5-325 MG per tablet Take 1 tablet by mouth every 4 (four) hours as needed. Patient not taking: Reported on 11/21/2013 07/20/13   Velta Addison Mikhail, DO  silver sulfADIAZINE (SILVADENE) 1 % cream Apply topically 2 (two) times daily. Patient not taking: Reported on 11/21/2013 07/20/13   Maryann Mikhail, DO   BP 159/88 mmHg  Pulse 82  Temp(Src) 97.9 F (36.6 C) (Oral)  Ht 6\' 3"  (1.905 m)  Wt 220 lb (99.791 kg)  BMI 27.50 kg/m2  SpO2 99% Physical Exam  Constitutional: He is oriented to person, place, and time. He appears well-developed and well-nourished.  Non-toxic appearance.  HENT:  Head: Normocephalic.  Right Ear: Tympanic membrane and external ear normal.  Left Ear: Tympanic membrane and external ear normal.  Eyes: EOM and lids are normal. Pupils are equal, round,  and reactive to light.  Neck: Normal range of motion. Neck supple. Carotid bruit is not present.  Cardiovascular: Normal rate, regular rhythm, normal heart sounds, intact distal pulses and normal pulses.   Pulmonary/Chest: Breath sounds normal. No respiratory distress.  Abdominal: Soft. Bowel sounds are normal. There is no tenderness. There is no guarding.  Musculoskeletal: Normal range of motion.       Lumbar back: He exhibits tenderness, pain and spasm. He exhibits no deformity.       Right upper leg: He exhibits tenderness. He exhibits no bony tenderness, no swelling, no edema, no deformity and no laceration.  DP on the right is 2+. Good ROM of the right knee and hip. No hot joints.  Lumbar paraspinal tenderness to palpation.  Lymphadenopathy:       Head (right side): No submandibular adenopathy present.       Head (left side): No submandibular adenopathy present.    He has  no cervical adenopathy.  Neurological: He is alert and oriented to person, place, and time. He has normal strength. No cranial nerve deficit or sensory deficit.  No gross motor or sensory deficit Pt ambulates with a cane at his usual baseline. No foot drop noted.  Skin: Skin is warm and dry.  Psychiatric: He has a normal mood and affect. His speech is normal.  Nursing note and vitals reviewed.   ED Course  Procedures (including critical care time) Labs Review Labs Reviewed - No data to display  Imaging Review No results found.   EKG Interpretation None      MDM  No gross neurovascular deficits. Pt ambulating with his cane at his usual baseline. Suspect muscle strain following lifting a heavy object. Pt to add robaxin tohis current medications. Pt to follow up with his primary MD concerning pain control.   Final diagnoses:  Muscle strain    *I have reviewed nursing notes, vital signs, and all appropriate lab and imaging results for this patient.**    Lenox Ahr, PA-C 11/23/13 2135  Richarda Blade, MD 11/23/13 [phone]

## 2013-11-21 NOTE — ED Notes (Signed)
Pt ambulated to restroom without difficulty with the use of his cane.

## 2013-12-03 ENCOUNTER — Emergency Department (HOSPITAL_COMMUNITY): Payer: Medicare Other

## 2013-12-03 ENCOUNTER — Encounter (HOSPITAL_COMMUNITY): Payer: Self-pay

## 2013-12-03 ENCOUNTER — Emergency Department (HOSPITAL_COMMUNITY)
Admission: EM | Admit: 2013-12-03 | Discharge: 2013-12-03 | Disposition: A | Discharge status: Home / Self Care | Payer: Medicare Other | Attending: Emergency Medicine | Admitting: Emergency Medicine

## 2013-12-03 DIAGNOSIS — Z8719 Personal history of other diseases of the digestive system: Secondary | ICD-10-CM | POA: Diagnosis not present

## 2013-12-03 DIAGNOSIS — S8991XA Unspecified injury of right lower leg, initial encounter: Secondary | ICD-10-CM | POA: Diagnosis not present

## 2013-12-03 DIAGNOSIS — Z720 Tobacco use: Secondary | ICD-10-CM | POA: Insufficient documentation

## 2013-12-03 DIAGNOSIS — Z8701 Personal history of pneumonia (recurrent): Secondary | ICD-10-CM | POA: Diagnosis not present

## 2013-12-03 DIAGNOSIS — Z96641 Presence of right artificial hip joint: Secondary | ICD-10-CM | POA: Diagnosis not present

## 2013-12-03 DIAGNOSIS — M25561 Pain in right knee: Secondary | ICD-10-CM

## 2013-12-03 DIAGNOSIS — G709 Myoneural disorder, unspecified: Secondary | ICD-10-CM | POA: Diagnosis not present

## 2013-12-03 DIAGNOSIS — S3992XA Unspecified injury of lower back, initial encounter: Secondary | ICD-10-CM | POA: Diagnosis not present

## 2013-12-03 DIAGNOSIS — Y9289 Other specified places as the place of occurrence of the external cause: Secondary | ICD-10-CM | POA: Diagnosis not present

## 2013-12-03 DIAGNOSIS — Z96653 Presence of artificial knee joint, bilateral: Secondary | ICD-10-CM | POA: Diagnosis not present

## 2013-12-03 DIAGNOSIS — F329 Major depressive disorder, single episode, unspecified: Secondary | ICD-10-CM | POA: Insufficient documentation

## 2013-12-03 DIAGNOSIS — Y998 Other external cause status: Secondary | ICD-10-CM | POA: Insufficient documentation

## 2013-12-03 DIAGNOSIS — Y9389 Activity, other specified: Secondary | ICD-10-CM | POA: Diagnosis not present

## 2013-12-03 DIAGNOSIS — E11621 Type 2 diabetes mellitus with foot ulcer: Secondary | ICD-10-CM | POA: Insufficient documentation

## 2013-12-03 DIAGNOSIS — Z8673 Personal history of transient ischemic attack (TIA), and cerebral infarction without residual deficits: Secondary | ICD-10-CM | POA: Insufficient documentation

## 2013-12-03 DIAGNOSIS — Z79899 Other long term (current) drug therapy: Secondary | ICD-10-CM | POA: Insufficient documentation

## 2013-12-03 DIAGNOSIS — F419 Anxiety disorder, unspecified: Secondary | ICD-10-CM | POA: Insufficient documentation

## 2013-12-03 DIAGNOSIS — S79921A Unspecified injury of right thigh, initial encounter: Secondary | ICD-10-CM | POA: Diagnosis not present

## 2013-12-03 DIAGNOSIS — G8929 Other chronic pain: Secondary | ICD-10-CM | POA: Diagnosis not present

## 2013-12-03 DIAGNOSIS — L97509 Non-pressure chronic ulcer of other part of unspecified foot with unspecified severity: Secondary | ICD-10-CM | POA: Diagnosis not present

## 2013-12-03 DIAGNOSIS — W1839XA Other fall on same level, initial encounter: Secondary | ICD-10-CM | POA: Insufficient documentation

## 2013-12-03 DIAGNOSIS — W19XXXA Unspecified fall, initial encounter: Secondary | ICD-10-CM

## 2013-12-03 DIAGNOSIS — S79911A Unspecified injury of right hip, initial encounter: Secondary | ICD-10-CM | POA: Diagnosis present

## 2013-12-03 DIAGNOSIS — M1990 Unspecified osteoarthritis, unspecified site: Secondary | ICD-10-CM | POA: Insufficient documentation

## 2013-12-03 DIAGNOSIS — M25551 Pain in right hip: Secondary | ICD-10-CM

## 2013-12-03 MED ORDER — OXYCODONE-ACETAMINOPHEN 5-325 MG PO TABS: 2.0000 | ORAL_TABLET | ORAL | Status: DC | PRN

## 2013-12-03 MED ORDER — HYDROMORPHONE HCL 1 MG/ML IJ SOLN
1.0000 mg | Freq: Once | INTRAMUSCULAR | Status: AC
  Administered 2013-12-03: 1 mg via INTRAMUSCULAR
  Filled 2013-12-03: qty 1

## 2013-12-03 MED ORDER — CYCLOBENZAPRINE HCL 10 MG PO TABS: 10.0000 mg | ORAL_TABLET | Freq: Two times a day (BID) | ORAL | Status: DC | PRN

## 2013-12-03 NOTE — ED Notes (Signed)
Patient states his right leg "gave out" and he fell on the right side. Patient complaining of right lower back, right hip, and right knee pain. Patient denies loss of consciousness. Patient is ambulatory with cain upon arrival to ED.

## 2013-12-03 NOTE — Discharge Instructions (Signed)
Xrays are normal..prescription for for pain meds and muscle relaxer

## 2013-12-03 NOTE — ED Provider Notes (Signed)
CSN: [id number]     Arrival date & time 12/03/13  1947 History  This chart was scribed for Nat Christen, MD by [name], ED Scribe. This patient was seen in room APA14/APA14 and the patient's care was started at 8:12 PM.    Chief Complaint  Patient presents with  . Fall    The history is provided by the patient. No language interpreter was used.   HPI Comments: Carvell Hoeffner is a 48 y.o. male who presents to the Emergency Department complaining of fall onset tonight. He is complaining of pain in right posterior buttock area and right thigh pain. He states that his right knee buckled on him and was the cause of his fall. He states that he fell onto his buttocks. He states that he has total right hip and total bilateral knee replacements. Denies any medications PTA. Denies any knee injury today  He states that he also has diabetic ulcer on his left foot. He states that recently it has been bleeding.    Past Medical History  Diagnosis Date  . Diabetic foot ulcer   . ETOH abuse   . Anxiety   . Open wound     bottom of foot  . Diabetes mellitus without complication     borderline  . Neuromuscular disorder     neuropathy  . GERD (gastroesophageal reflux disease)     tums  . Pneumonia ~ 2012  . History of blood transfusion     "related to left knee OR; probably right hip too" (04/21/2012)  . Stroke 2008    "they said I might have had one during right hip replacement" (04/21/2012)  . Arthritis     "everywhere" (04/21/2012)  . Mental disorder   . Depression   . DDD (degenerative disc disease)    Past Surgical History  Procedure Laterality Date  . Total hip arthroplasty Right 2008  . Total knee arthroplasty Left 2006  . Joint replacement    . Lung lobectomy Left ~ 2006  . Revision total hip arthroplasty Right 2008    "4-5 months after replacement" (04/21/2012)  . Knee arthroscopy Bilateral 1980's/1990's  . Lung lobectomy    . Metatarsal osteotomy  10/29/2011    Procedure:  METATARSAL OSTEOTOMY;  Surgeon: [name], MD;  Location: [facility];  Service: Orthopedics;  Laterality: Left;  Left 1st Metatarsal Dorsal Closing Wedge   . Total knee arthroplasty Right 04/20/2012  . Total knee arthroplasty Right 04/20/2012    Procedure: TOTAL KNEE ARTHROPLASTY;  Surgeon: [name], MD;  Location: [facility];  Service: Orthopedics;  Laterality: Right;  Right Total Knee Arthroplasty   History reviewed. No pertinent family history. History  Substance Use Topics  . Smoking status: Current Every Day Smoker -- 1.00 packs/day for 30 years    Types: Cigarettes  . Smokeless tobacco: Never Used     Comment: 04/21/2012 offered smoking cessation materials; pt declines  . Alcohol Use: No     Comment: last used march 11    Review of Systems A complete 10 system review of systems was obtained and all systems are negative except as noted in the HPI and PMH.     Allergies  Benadryl and Trazodone and nefazodone  Home Medications   Prior to Admission medications   Medication Sig Start Date End Date Taking? Authorizing Provider  clonazePAM (KLONOPIN) 1 MG tablet Take 1 mg by mouth 4 (four) times daily.   Yes Historical Provider, MD  escitalopram (LEXAPRO) 20  MG tablet Take 1 tablet (20 mg total) by mouth every morning. For depression 03/29/13  Yes Encarnacion Slates, NP  gabapentin (NEURONTIN) 400 MG capsule Take 1 capsule (400 mg total) by mouth 4 (four) times daily. For substance withdrawal syndrome 03/29/13  Yes Encarnacion Slates, NP  ibuprofen (ADVIL,MOTRIN) 200 MG tablet Take 400 mg by mouth every 6 (six) hours as needed for moderate pain.   Yes Historical Provider, MD  mirtazapine (REMERON) 30 MG tablet Take 30 mg by mouth at bedtime.   Yes Historical Provider, MD  QUEtiapine (SEROQUEL) 200 MG tablet Take 1 tablet (200 mg total) by mouth at bedtime. For mood control 03/29/13  Yes Encarnacion Slates, NP  cyclobenzaprine (FLEXERIL) 10 MG tablet Take 1 tablet (10 mg total) by mouth 2 (two) times daily as  needed for muscle spasms. 12/03/13   Nat Christen, MD  ibuprofen (ADVIL,MOTRIN) 600 MG tablet Take 1 tablet (600 mg total) by mouth every 6 (six) hours as needed for moderate pain. Patient not taking: Reported on 11/21/2013 07/12/13   Evalee Jefferson, PA-C  ibuprofen (ADVIL,MOTRIN) 800 MG tablet Take 1 tablet (800 mg total) by mouth 3 (three) times daily. Patient not taking: Reported on 11/21/2013 10/04/13   Carman Ching, PA-C  methocarbamol (ROBAXIN) 500 MG tablet Take 1 tablet (500 mg total) by mouth 3 (three) times daily. Patient not taking: Reported on 12/03/2013 11/21/13   Lenox Ahr, PA-C  nicotine (NICODERM CQ - DOSED IN MG/24 HOURS) 21 mg/24hr patch Place 1 patch (21 mg total) onto the skin daily. Patient not taking: Reported on 11/21/2013 07/20/13   Velta Addison Mikhail, DO  oxyCODONE-acetaminophen (PERCOCET) 5-325 MG per tablet Take 2 tablets by mouth every 4 (four) hours as needed. 12/03/13   Nat Christen, MD  oxyCODONE-acetaminophen (PERCOCET) 5-325 MG per tablet Take 2 tablets by mouth every 4 (four) hours as needed. 12/03/13   Nat Christen, MD  oxyCODONE-acetaminophen (PERCOCET/ROXICET) 5-325 MG per tablet Take 1 tablet by mouth every 4 (four) hours as needed. Patient not taking: Reported on 11/21/2013 07/20/13   Velta Addison Mikhail, DO  silver sulfADIAZINE (SILVADENE) 1 % cream Apply topically 2 (two) times daily. Patient not taking: Reported on 11/21/2013 07/20/13   Cristal Ford, DO   Triage Vitals: BP 152/85 mmHg  Pulse 89  Temp(Src) 97.7 F (36.5 C) (Oral)  Resp 20  Ht 6\' 3"  (1.905 m)  Wt 200 lb (90.719 kg)  BMI 25.00 kg/m2  SpO2 100%  Physical Exam  Constitutional: He is oriented to person, place, and time. He appears well-developed and well-nourished.  HENT:  Head: Normocephalic and atraumatic.  Eyes: Conjunctivae and EOM are normal. Pupils are equal, round, and reactive to light.  Neck: Normal range of motion. Neck supple.  Cardiovascular: Normal rate, regular rhythm and normal heart  sounds.   Pulmonary/Chest: Effort normal and breath sounds normal.  Abdominal: Soft. Bowel sounds are normal.  Musculoskeletal: Normal range of motion. He exhibits tenderness.  Tender in right posterior lateral buttocks and right thigh, right knee general tenderness.  Neurological: He is alert and oriented to person, place, and time.  Skin: Skin is warm and dry.  Plantar aspect of left foot 2.5 cm ulceration  Psychiatric: He has a normal mood and affect. His behavior is normal.  Nursing note and vitals reviewed.   ED Course  Procedures (including critical care time) DIAGNOSTIC STUDIES: Oxygen Saturation is 100% on RA, normal by my interpretation.    COORDINATION OF CARE: 8:18 PM-Discussed treatment  plan which includes X-rays of right hip, femur, and right knee with pt at bedside and pt agreed to plan.     Labs Review Labs Reviewed - No data to display  Imaging Review No results found.   EKG Interpretation None      MDM   Final diagnoses:  Fall  Right hip pain  Right knee pain    Patient has known chronic arthritic pain. Plain films of right femur and right knee show no acute findings. No head or neck trauma.  Discharge medications Percocet and Flexeril 10 mg   I personally performed the services described in this documentation, which was scribed in my presence. The recorded information has been reviewed and is accurate.      Nat Christen, MD 12/07/13 2151

## 2013-12-18 ENCOUNTER — Inpatient Hospital Stay (HOSPITAL_COMMUNITY): Payer: Medicare Other

## 2013-12-18 ENCOUNTER — Inpatient Hospital Stay (HOSPITAL_COMMUNITY)
Admission: EM | Admit: 2013-12-18 | Discharge: 2013-12-22 | DRG: 603 | Disposition: A | Discharge status: Home Health | Payer: Medicare Other | Attending: Internal Medicine | Admitting: Internal Medicine

## 2013-12-18 ENCOUNTER — Emergency Department (HOSPITAL_COMMUNITY): Payer: Medicare Other

## 2013-12-18 ENCOUNTER — Encounter (HOSPITAL_COMMUNITY): Payer: Self-pay | Admitting: Emergency Medicine

## 2013-12-18 DIAGNOSIS — Z96641 Presence of right artificial hip joint: Secondary | ICD-10-CM | POA: Diagnosis present

## 2013-12-18 DIAGNOSIS — D509 Iron deficiency anemia, unspecified: Secondary | ICD-10-CM | POA: Diagnosis present

## 2013-12-18 DIAGNOSIS — L97529 Non-pressure chronic ulcer of other part of left foot with unspecified severity: Secondary | ICD-10-CM

## 2013-12-18 DIAGNOSIS — E1165 Type 2 diabetes mellitus with hyperglycemia: Secondary | ICD-10-CM | POA: Diagnosis present

## 2013-12-18 DIAGNOSIS — E08621 Diabetes mellitus due to underlying condition with foot ulcer: Secondary | ICD-10-CM

## 2013-12-18 DIAGNOSIS — F411 Generalized anxiety disorder: Secondary | ICD-10-CM | POA: Diagnosis present

## 2013-12-18 DIAGNOSIS — F419 Anxiety disorder, unspecified: Secondary | ICD-10-CM | POA: Diagnosis present

## 2013-12-18 DIAGNOSIS — F1020 Alcohol dependence, uncomplicated: Secondary | ICD-10-CM

## 2013-12-18 DIAGNOSIS — Z96653 Presence of artificial knee joint, bilateral: Secondary | ICD-10-CM | POA: Diagnosis present

## 2013-12-18 DIAGNOSIS — E1140 Type 2 diabetes mellitus with diabetic neuropathy, unspecified: Secondary | ICD-10-CM | POA: Diagnosis present

## 2013-12-18 DIAGNOSIS — E11621 Type 2 diabetes mellitus with foot ulcer: Secondary | ICD-10-CM | POA: Diagnosis present

## 2013-12-18 DIAGNOSIS — K219 Gastro-esophageal reflux disease without esophagitis: Secondary | ICD-10-CM | POA: Diagnosis present

## 2013-12-18 DIAGNOSIS — L03119 Cellulitis of unspecified part of limb: Secondary | ICD-10-CM

## 2013-12-18 DIAGNOSIS — M86662 Other chronic osteomyelitis, left tibia and fibula: Secondary | ICD-10-CM | POA: Diagnosis present

## 2013-12-18 DIAGNOSIS — L0390 Cellulitis, unspecified: Secondary | ICD-10-CM

## 2013-12-18 DIAGNOSIS — L97521 Non-pressure chronic ulcer of other part of left foot limited to breakdown of skin: Secondary | ICD-10-CM | POA: Diagnosis present

## 2013-12-18 DIAGNOSIS — L02419 Cutaneous abscess of limb, unspecified: Secondary | ICD-10-CM

## 2013-12-18 DIAGNOSIS — F17210 Nicotine dependence, cigarettes, uncomplicated: Secondary | ICD-10-CM | POA: Diagnosis present

## 2013-12-18 DIAGNOSIS — F1910 Other psychoactive substance abuse, uncomplicated: Secondary | ICD-10-CM

## 2013-12-18 DIAGNOSIS — Z8673 Personal history of transient ischemic attack (TIA), and cerebral infarction without residual deficits: Secondary | ICD-10-CM

## 2013-12-18 DIAGNOSIS — L97519 Non-pressure chronic ulcer of other part of right foot with unspecified severity: Secondary | ICD-10-CM

## 2013-12-18 DIAGNOSIS — L03116 Cellulitis of left lower limb: Principal | ICD-10-CM

## 2013-12-18 DIAGNOSIS — L97509 Non-pressure chronic ulcer of other part of unspecified foot with unspecified severity: Secondary | ICD-10-CM

## 2013-12-18 HISTORY — DX: Cellulitis of unspecified part of limb: L03.119

## 2013-12-18 HISTORY — DX: Cutaneous abscess of unspecified foot: L02.619

## 2013-12-18 LAB — BASIC METABOLIC PANEL
Anion gap: 12 (ref 5–15)
BUN: 5 mg/dL — AB (ref 6–23)
CALCIUM: 9 mg/dL (ref 8.4–10.5)
CO2: 26 mEq/L (ref 19–32)
CREATININE: 1.07 mg/dL (ref 0.50–1.35)
Chloride: 95 mEq/L — ABNORMAL LOW (ref 96–112)
GFR calc Af Amer: >90 mL/min (ref >90)
GFR, EST NON AFRICAN AMERICAN: 79 mL/min — AB (ref >90)
GLUCOSE: 97 mg/dL (ref 70–99)
Potassium: 4.1 mEq/L (ref 3.7–5.3)
Sodium: 133 mEq/L — ABNORMAL LOW (ref 137–147)

## 2013-12-18 LAB — CBC
HEMATOCRIT: 31 % — AB (ref 39.0–52.0)
Hemoglobin: 9.9 g/dL — ABNORMAL LOW (ref 13.0–17.0)
MCH: 24.6 pg — ABNORMAL LOW (ref 26.0–34.0)
MCHC: 31.9 g/dL (ref 30.0–36.0)
MCV: 76.9 fL — AB (ref 78.0–100.0)
PLATELETS: 440 10*3/uL — AB (ref 150–400)
RBC: 4.03 MIL/uL — AB (ref 4.22–5.81)
RDW: 18.4 % — AB (ref 11.5–15.5)
WBC: 10.5 10*3/uL (ref 4.0–10.5)

## 2013-12-18 LAB — GLUCOSE, CAPILLARY: Glucose-Capillary: 94 mg/dL (ref 70–99)

## 2013-12-18 LAB — CBC WITH DIFFERENTIAL/PLATELET
Basophils Absolute: 0 10*3/uL (ref 0.0–0.1)
Basophils Relative: 0 % (ref 0–1)
EOS ABS: 0.1 10*3/uL (ref 0.0–0.7)
Eosinophils Relative: 1 % (ref 0–5)
HCT: 32.4 % — ABNORMAL LOW (ref 39.0–52.0)
Hemoglobin: 10.4 g/dL — ABNORMAL LOW (ref 13.0–17.0)
Lymphocytes Relative: 16 % (ref 12–46)
Lymphs Abs: 2.1 10*3/uL (ref 0.7–4.0)
MCH: 24.6 pg — ABNORMAL LOW (ref 26.0–34.0)
MCHC: 32.1 g/dL (ref 30.0–36.0)
MCV: 76.8 fL — ABNORMAL LOW (ref 78.0–100.0)
Monocytes Absolute: 1.2 10*3/uL — ABNORMAL HIGH (ref 0.1–1.0)
Monocytes Relative: 9 % (ref 3–12)
NEUTROS ABS: 10 10*3/uL — AB (ref 1.7–7.7)
NEUTROS PCT: 74 % (ref 43–77)
Platelets: 462 10*3/uL — ABNORMAL HIGH (ref 150–400)
RBC: 4.22 MIL/uL (ref 4.22–5.81)
RDW: 18.3 % — AB (ref 11.5–15.5)
WBC: 13.3 10*3/uL — AB (ref 4.0–10.5)

## 2013-12-18 LAB — URINALYSIS, ROUTINE W REFLEX MICROSCOPIC
GLUCOSE, UA: 100 mg/dL — AB
HGB URINE DIPSTICK: NEGATIVE
KETONES UR: NEGATIVE mg/dL
Leukocytes, UA: NEGATIVE
Nitrite: NEGATIVE
Protein, ur: NEGATIVE mg/dL
Specific Gravity, Urine: 1.017 (ref 1.005–1.030)
Urobilinogen, UA: 0.2 mg/dL (ref 0.0–1.0)
pH: 5.5 (ref 5.0–8.0)

## 2013-12-18 LAB — CREATININE, SERUM: Creatinine, Ser: 0.84 mg/dL (ref 0.50–1.35)

## 2013-12-18 LAB — SEDIMENTATION RATE: Sed Rate: 104 mm/hr — ABNORMAL HIGH (ref 0–16)

## 2013-12-18 LAB — C-REACTIVE PROTEIN: CRP: 14.9 mg/dL — AB (ref <0.60)

## 2013-12-18 MED ORDER — VANCOMYCIN HCL IN DEXTROSE 1-5 GM/200ML-% IV SOLN
1000.0000 mg | Freq: Once | INTRAVENOUS | Status: AC
  Administered 2013-12-18: 1000 mg via INTRAVENOUS
  Filled 2013-12-18 (×2): qty 200

## 2013-12-18 MED ORDER — METRONIDAZOLE 500 MG PO TABS
500.0000 mg | ORAL_TABLET | Freq: Three times a day (TID) | ORAL | Status: DC
  Administered 2013-12-18: 500 mg via ORAL
  Filled 2013-12-18 (×3): qty 1

## 2013-12-18 MED ORDER — DEXTROSE 5 % IV SOLN
2.0000 g | INTRAVENOUS | Status: DC
  Administered 2013-12-18 (×2): 2 g via INTRAVENOUS
  Filled 2013-12-18: qty 2

## 2013-12-18 MED ORDER — SODIUM CHLORIDE 0.9 % IV BOLUS (SEPSIS)
1000.0000 mL | Freq: Once | INTRAVENOUS | Status: AC
  Administered 2013-12-18: 1000 mL via INTRAVENOUS

## 2013-12-18 MED ORDER — MORPHINE SULFATE 2 MG/ML IJ SOLN
2.0000 mg | INTRAMUSCULAR | Status: DC | PRN
  Administered 2013-12-18 – 2013-12-19 (×2): 2 mg via INTRAVENOUS
  Filled 2013-12-18 (×3): qty 1

## 2013-12-18 MED ORDER — VANCOMYCIN HCL IN DEXTROSE 1-5 GM/200ML-% IV SOLN
1000.0000 mg | Freq: Three times a day (TID) | INTRAVENOUS | Status: DC
  Administered 2013-12-19 – 2013-12-22 (×11): 1000 mg via INTRAVENOUS
  Filled 2013-12-18 (×12): qty 200

## 2013-12-18 MED ORDER — ACETAMINOPHEN 650 MG RE SUPP: 650.0000 mg | Freq: Four times a day (QID) | RECTAL | Status: DC | PRN

## 2013-12-18 MED ORDER — FOLIC ACID 1 MG PO TABS
1.0000 mg | ORAL_TABLET | Freq: Every day | ORAL | Status: DC
  Administered 2013-12-18 – 2013-12-22 (×5): 1 mg via ORAL
  Filled 2013-12-18 (×5): qty 1

## 2013-12-18 MED ORDER — ONDANSETRON HCL 4 MG/2ML IJ SOLN: 4.0000 mg | Freq: Four times a day (QID) | INTRAMUSCULAR | Status: DC | PRN

## 2013-12-18 MED ORDER — CLONAZEPAM 1 MG PO TABS
1.0000 mg | ORAL_TABLET | Freq: Three times a day (TID) | ORAL | Status: DC | PRN
  Administered 2013-12-18 – 2013-12-20 (×3): 1 mg via ORAL
  Filled 2013-12-18 (×3): qty 1

## 2013-12-18 MED ORDER — MORPHINE SULFATE 4 MG/ML IJ SOLN
4.0000 mg | Freq: Once | INTRAMUSCULAR | Status: AC
  Administered 2013-12-18: 4 mg via INTRAVENOUS
  Filled 2013-12-18: qty 1

## 2013-12-18 MED ORDER — ADULT MULTIVITAMIN W/MINERALS CH
1.0000 | ORAL_TABLET | Freq: Every day | ORAL | Status: DC
  Administered 2013-12-18 – 2013-12-22 (×5): 1 via ORAL
  Filled 2013-12-18 (×5): qty 1

## 2013-12-18 MED ORDER — VITAMIN B-1 100 MG PO TABS
100.0000 mg | ORAL_TABLET | Freq: Every day | ORAL | Status: DC
  Administered 2013-12-18 – 2013-12-22 (×5): 100 mg via ORAL
  Filled 2013-12-18 (×5): qty 1

## 2013-12-18 MED ORDER — HEPARIN SODIUM (PORCINE) 5000 UNIT/ML IJ SOLN
5000.0000 [IU] | Freq: Three times a day (TID) | INTRAMUSCULAR | Status: DC
  Administered 2013-12-18 – 2013-12-22 (×11): 5000 [IU] via SUBCUTANEOUS
  Filled 2013-12-18 (×13): qty 1

## 2013-12-18 MED ORDER — ESCITALOPRAM OXALATE 20 MG PO TABS
20.0000 mg | ORAL_TABLET | Freq: Every morning | ORAL | Status: DC
  Administered 2013-12-19 – 2013-12-22 (×4): 20 mg via ORAL
  Filled 2013-12-18 (×4): qty 1

## 2013-12-18 MED ORDER — ACETAMINOPHEN 325 MG PO TABS
650.0000 mg | ORAL_TABLET | Freq: Four times a day (QID) | ORAL | Status: DC | PRN
Start: 2013-12-18 — End: 2013-12-22

## 2013-12-18 MED ORDER — GABAPENTIN 400 MG PO CAPS
400.0000 mg | ORAL_CAPSULE | Freq: Four times a day (QID) | ORAL | Status: DC
  Administered 2013-12-18 – 2013-12-22 (×15): 400 mg via ORAL
  Filled 2013-12-18 (×19): qty 1

## 2013-12-18 MED ORDER — DEXTROSE 5 % IV SOLN
2.0000 g | Freq: Two times a day (BID) | INTRAVENOUS | Status: DC
  Administered 2013-12-18 – 2013-12-22 (×9): 2 g via INTRAVENOUS
  Filled 2013-12-18 (×11): qty 2

## 2013-12-18 MED ORDER — QUETIAPINE FUMARATE 200 MG PO TABS
200.0000 mg | ORAL_TABLET | Freq: Every day | ORAL | Status: DC
  Administered 2013-12-18 – 2013-12-21 (×4): 200 mg via ORAL
  Filled 2013-12-18 (×5): qty 1

## 2013-12-18 MED ORDER — ONDANSETRON HCL 4 MG PO TABS: 4.0000 mg | ORAL_TABLET | Freq: Four times a day (QID) | ORAL | Status: DC | PRN

## 2013-12-18 MED ORDER — THIAMINE HCL 100 MG/ML IJ SOLN
100.0000 mg | Freq: Every day | INTRAMUSCULAR | Status: DC
  Filled 2013-12-18 (×4): qty 1

## 2013-12-18 MED ORDER — OXYCODONE HCL 5 MG PO TABS
5.0000 mg | ORAL_TABLET | ORAL | Status: DC | PRN
  Administered 2013-12-18 – 2013-12-21 (×6): 5 mg via ORAL
  Filled 2013-12-18 (×6): qty 1

## 2013-12-18 MED ORDER — LORAZEPAM 2 MG/ML IJ SOLN: 1.0000 mg | Freq: Four times a day (QID) | INTRAMUSCULAR | Status: AC | PRN

## 2013-12-18 MED ORDER — INSULIN ASPART 100 UNIT/ML ~~LOC~~ SOLN: 0.0000 [IU] | Freq: Every day | SUBCUTANEOUS | Status: DC

## 2013-12-18 MED ORDER — PANTOPRAZOLE SODIUM 40 MG PO TBEC
40.0000 mg | DELAYED_RELEASE_TABLET | Freq: Every day | ORAL | Status: DC
  Administered 2013-12-18 – 2013-12-22 (×5): 40 mg via ORAL
  Filled 2013-12-18 (×5): qty 1

## 2013-12-18 MED ORDER — ALUM & MAG HYDROXIDE-SIMETH 200-200-20 MG/5ML PO SUSP: 30.0000 mL | Freq: Four times a day (QID) | ORAL | Status: DC | PRN

## 2013-12-18 MED ORDER — SODIUM CHLORIDE 0.9 % IJ SOLN: 3.0000 mL | INTRAMUSCULAR | Status: DC | PRN

## 2013-12-18 MED ORDER — SODIUM CHLORIDE 0.9 % IV SOLN
INTRAVENOUS | Status: DC
  Administered 2013-12-18 – 2013-12-21 (×6): via INTRAVENOUS

## 2013-12-18 MED ORDER — SODIUM CHLORIDE 0.9 % IJ SOLN
3.0000 mL | Freq: Two times a day (BID) | INTRAMUSCULAR | Status: DC
  Administered 2013-12-18 – 2013-12-20 (×3): 3 mL via INTRAVENOUS

## 2013-12-18 MED ORDER — SODIUM CHLORIDE 0.9 % IV SOLN: 250.0000 mL | INTRAVENOUS | Status: DC | PRN

## 2013-12-18 MED ORDER — METHOCARBAMOL 500 MG PO TABS
500.0000 mg | ORAL_TABLET | Freq: Three times a day (TID) | ORAL | Status: DC
  Administered 2013-12-18 – 2013-12-22 (×12): 500 mg via ORAL
  Filled 2013-12-18 (×14): qty 1

## 2013-12-18 MED ORDER — LORAZEPAM 1 MG PO TABS
1.0000 mg | ORAL_TABLET | Freq: Four times a day (QID) | ORAL | Status: AC | PRN
  Filled 2013-12-18: qty 1

## 2013-12-18 NOTE — H&P (Signed)
[facility] History and Physical  [name] MEQ:[id number] DOB: 1965-11-17 DOA: 12/18/2013  Referring physician:  PCP: No PCP Per Patient   Chief Complaint: Left lower extremity pain and swelling  HPI: [name] is a 48 y.o. male with a past medical history of diabetes mellitus with chronic diabetic foot ulcer in plantar region of left foot, diabetic neuropathy, history of alcohol abuse, who presents to the emergency room with complaints of worsening left lower extremity pain and swelling. He was recently hospitalized at [facility] for treatment of left lower extremity cellulitis and discharged last week on clindamycin therapy. Since discharge he reports severe pain involving his left lower extremity associated with increased swelling, erythema, weeping from wounds. Wound care has been following him at home. He reports associated fevers and chills having a temperature of 100.4 last week. He reports compliance to his medications. He saw Dr [name] of orthopedic surgery today in the office and advised to present to the hospital for admission. He denies nausea, vomiting, bloody stools, chest pain, shortness of breath, abdominal pain, dysuria, hematuria. In the emergency room he was Palmer IV Ceftriaxone and Flagyl. Plain films did not reveal osteomyelitis. Emergency room provider discussed case with his orthopedic surgeon Dr. Sharol Palmer who will see patient during this hospitalization.                                                                                                                                                                                                                       Review of Systems:  Constitutional:  No weight loss, night sweats, positive for Fevers, chills, fatigue.  HEENT:  No headaches, Difficulty swallowing,Tooth/dental problems,Sore throat,  No sneezing, itching, ear ache, nasal congestion, post nasal drip,  Cardio-vascular:  No chest pain, Orthopnea,  PND, swelling in lower extremities, anasarca, dizziness, palpitations  GI:  No heartburn, indigestion, abdominal pain, nausea, vomiting, diarrhea, change in bowel habits, loss of appetite  Resp:  No shortness of breath with exertion or at rest. No excess mucus, no productive cough, No non-productive cough, No coughing up of blood.No change in color of mucus.No wheezing.No chest wall deformity  Skin:  Positive for significant pain, redness and swelling of his left lower extremity.  GU:  no dysuria, change in color of urine, no urgency or frequency. No flank pain.  Musculoskeletal:  No joint pain or swelling. No decreased range of motion. No back pain.  Psych:  No change in mood or affect. No depression or anxiety. No  memory loss.   Past Medical History  Diagnosis Date  . Diabetic foot ulcer   . ETOH abuse   . Anxiety   . Open wound     bottom of foot  . Diabetes mellitus without complication     borderline  . Neuromuscular disorder     neuropathy  . GERD (gastroesophageal reflux disease)     tums  . Pneumonia ~ 2012  . History of blood transfusion     "related to left knee OR; probably right hip too" (04/21/2012)  . Stroke 2008    "they said I might have had one during right hip replacement" (04/21/2012)  . Arthritis     "everywhere" (04/21/2012)  . Mental disorder   . Depression   . DDD (degenerative disc disease)    Past Surgical History  Procedure Laterality Date  . Total hip arthroplasty Right 2008  . Total knee arthroplasty Left 2006  . Joint replacement    . Lung lobectomy Left ~ 2006  . Revision total hip arthroplasty Right 2008    "4-5 months after replacement" (04/21/2012)  . Knee arthroscopy Bilateral 1980's/1990's  . Lung lobectomy    . Metatarsal osteotomy  10/29/2011    Procedure: METATARSAL OSTEOTOMY;  Surgeon: Javier Minion, MD;  Location: [facility];  Service: Orthopedics;  Laterality: Left;  Left 1st Metatarsal Dorsal Closing Wedge   . Total knee arthroplasty  Right 04/20/2012  . Total knee arthroplasty Right 04/20/2012    Procedure: TOTAL KNEE ARTHROPLASTY;  Surgeon: Javier Minion, MD;  Location: [facility];  Service: Orthopedics;  Laterality: Right;  Right Total Knee Arthroplasty   Social History:  reports that he has been smoking Cigarettes.  He has a 30 pack-year smoking history. He has never used smokeless tobacco. He reports that he uses illicit drugs (Marijuana and Cocaine). He reports that he does not drink alcohol.  Allergies  Allergen Reactions  . Benadryl [Diphenhydramine Hcl] Other (See Comments)    Pt states leg spasm  . Trazodone And Nefazodone Other (See Comments)    Leg Spasms.     History reviewed. No pertinent family history.   Prior to Admission medications   Medication Sig Start Date End Date Taking? Authorizing Provider  clonazePAM (KLONOPIN) 1 MG tablet Take 1 mg by mouth 4 (four) times daily.   Yes Historical Provider, MD  escitalopram (LEXAPRO) 20 MG tablet Take 1 tablet (20 mg total) by mouth every morning. For depression 03/29/13  Yes Javier Slates, NP  gabapentin (NEURONTIN) 400 MG capsule Take 1 capsule (400 mg total) by mouth 4 (four) times daily. For substance withdrawal syndrome 03/29/13  Yes Javier Slates, NP  ibuprofen (ADVIL,MOTRIN) 200 MG tablet Take 400 mg by mouth every 6 (six) hours as needed for moderate pain.   Yes Historical Provider, MD  mirtazapine (REMERON) 30 MG tablet Take 30 mg by mouth at bedtime.   Yes Historical Provider, MD  potassium chloride SA (K-DUR,KLOR-CON) 20 MEQ tablet Take 20 mEq by mouth daily.   Yes Historical Provider, MD  QUEtiapine (SEROQUEL) 200 MG tablet Take 1 tablet (200 mg total) by mouth at bedtime. For mood control 03/29/13  Yes Javier Slates, NP  cyclobenzaprine (FLEXERIL) 10 MG tablet Take 1 tablet (10 mg total) by mouth 2 (two) times daily as needed for muscle spasms. Patient not taking: Reported on 12/18/2013 12/03/13   Javier Christen, MD  ibuprofen (ADVIL,MOTRIN) 600 MG tablet Take 1  tablet (600 mg total) by mouth every  6 (six) hours as needed for moderate pain. Patient not taking: Reported on 11/21/2013 07/12/13   Javier Jefferson, PA-C  ibuprofen (ADVIL,MOTRIN) 800 MG tablet Take 1 tablet (800 mg total) by mouth 3 (three) times daily. Patient not taking: Reported on 11/21/2013 10/04/13   Carman Ching, PA-C  methocarbamol (ROBAXIN) 500 MG tablet Take 1 tablet (500 mg total) by mouth 3 (three) times daily. Patient not taking: Reported on 12/03/2013 11/21/13   Lenox Ahr, PA-C  nicotine (NICODERM CQ - DOSED IN MG/24 HOURS) 21 mg/24hr patch Place 1 patch (21 mg total) onto the skin daily. Patient not taking: Reported on 11/21/2013 07/20/13   Velta Addison Mikhail, DO  oxyCODONE-acetaminophen (PERCOCET) 5-325 MG per tablet Take 2 tablets by mouth every 4 (four) hours as needed. Patient not taking: Reported on 12/18/2013 12/03/13   Javier Christen, MD  oxyCODONE-acetaminophen (PERCOCET) 5-325 MG per tablet Take 2 tablets by mouth every 4 (four) hours as needed. Patient not taking: Reported on 12/18/2013 12/03/13   Javier Christen, MD  oxyCODONE-acetaminophen (PERCOCET/ROXICET) 5-325 MG per tablet Take 1 tablet by mouth every 4 (four) hours as needed. Patient not taking: Reported on 11/21/2013 07/20/13   Velta Addison Mikhail, DO  silver sulfADIAZINE (SILVADENE) 1 % cream Apply topically 2 (two) times daily. Patient not taking: Reported on 11/21/2013 07/20/13   Cristal Ford, DO   Physical Exam: Filed Vitals:   12/18/13 1200 12/18/13 1215 12/18/13 1230 12/18/13 1245  BP: 130/84 140/79 145/71 151/75  Pulse: 92 90 88 93  Temp:      Resp:      SpO2: 95% 96% 99% 99%    Wt Readings from Last 3 Encounters:  12/03/13 90.719 kg (200 lb)  11/21/13 99.791 kg (220 lb)  10/04/13 99.791 kg (220 lb)    General:  Appears calm and in no acute distress.  Eyes: PERRL, normal lids, irises & conjunctiva ENT: grossly normal hearing, lips & tongue, poor dentition Neck: no LAD, masses or thyromegaly Cardiovascular: RRR,  no m/r/g.  Telemetry: SR, no arrhythmias  Respiratory: CTA bilaterally, no w/r/r. Normal respiratory effort. Abdomen: Obese, soft, ntnd Skin: He has extensive of erythema and edema involving his left foot extending superiorly to his knee, from their erythema streaking up on medial aspect of his thigh, there are several open wounds, leg weeping, has a 2 x 2 cm ulceration over plantar aspect of left foot, did not note purulence Musculoskeletal: grossly normal tone BUE/BLE Psychiatric: grossly normal mood and affect, speech fluent and appropriate Neurologic: Decreased sensation to lower extremities bilaterally, otherwise cranial nerves II through XII are grossly intact no alteration to sensation 5 out of 5 muscle strength to bilateral upper and lower extremities           Labs on Admission:  Basic Metabolic Panel:  Recent Labs Lab 12/18/13 1030  NA 133*  K 4.1  CL 95*  CO2 26  GLUCOSE 97  BUN 5*  CREATININE 1.07  CALCIUM 9.0   Liver Function Tests: No results for input(s): AST, ALT, ALKPHOS, BILITOT, PROT, ALBUMIN in the last 168 hours. No results for input(s): LIPASE, AMYLASE in the last 168 hours. No results for input(s): AMMONIA in the last 168 hours. CBC:  Recent Labs Lab 12/18/13 1030  WBC 13.3*  NEUTROABS 10.0*  HGB 10.4*  HCT 32.4*  MCV 76.8*  PLT 462*   Cardiac Enzymes: No results for input(s): CKTOTAL, CKMB, CKMBINDEX, TROPONINI in the last 168 hours.  BNP (last 3 results) No results for input(s):  PROBNP in the last 8760 hours. CBG: No results for input(s): GLUCAP in the last 168 hours.  Radiological Exams on Admission: Dg Ankle Complete Left  12/18/2013   CLINICAL DATA:  Diabetic foot.  Cellulitis  EXAM: LEFT ANKLE COMPLETE - 3+ VIEW  COMPARISON:  06/30/2013  FINDINGS: Diffuse soft tissue swelling has progressed. Joint effusion is noted. Joint space is normal.  Negative for fracture or osteomyelitis.  First metatarsal screw again noted.  IMPRESSION: Diffuse  soft tissue swelling and joint effusion. Negative for fracture or osteomyelitis.   Electronically Signed   By: [name] Gallo M.D.   On: 12/18/2013 11:31   Dg Foot Complete Left  12/18/2013   CLINICAL DATA:  Diabetic ulcer of foot, wound cellulitis  EXAM: LEFT FOOT - COMPLETE 3+ VIEW  COMPARISON:  10/04/2013  FINDINGS: Prior fusion of first TMT joint.  Diffuse osseous demineralization.  Scattered soft tissue swelling.  Remaining joint spaces preserved.  No acute fracture, dislocation, or bone destruction.  Specifically, no soft tissue gas or radiographic evidence of osteomyelitis identified.  IMPRESSION: Osseous demineralization with diffuse soft tissue swelling LEFT foot.  Post first TMT joint fusion.  No acute osseous abnormalities.   Electronically Signed   By: Lavonia Dana M.D.   On: 12/18/2013 11:32    EKG: Independently reviewed.   Assessment/Plan Principal Problem:   Cellulitis of right leg Active Problems:   Foot ulcer, right   Type 2 diabetes mellitus with left diabetic foot ulcer   Diabetes mellitus due to underlying condition with foot ulcer   Alcohol dependence   Cellulitis   1. Left lower extremity cellulitis. Patient having a history of diabetes mellitus and chronic diabetic foot ulcer involving plantar aspect of left foot, recently [facility] P as he was discharged on clindamycin therapy, now presenting with worsening pain, swelling, erythema of left lower extremity. He was seen earlier today by Dr. Sharol Palmer in the office and advised to come to the hospital to receive IV antibiotic therapy. Palmer history of diabetes mellitus I'm concerned for the possibility of underlying osteomyelitis. Although plain films did not show evidence of osteomyelitis will further workup with MRI of left foot and left leg. Blood cultures were obtained in the emergency room, will start broad-spectrum empiric IV antimicrobial therapy with vancomycin and  cefepime. Await further recommendations from Dr [name]. Follow-up on blood cultures. Plan supportive care. 2. Type 2 diabetes mellitus. Patient reported not being on hypoglycemic therapy, will check hemoglobin A1c, Accu-Cheks every before meals and daily at bedtime with sliding scale coverage. In the emergency room he had a glucose of 97. He does not check his blood sugars at home. 3. Chronic left foot ulcer. Probable port of entry for cellulitis. Will obtain MRI of foot to assess for possible underlying osteomyelitis 4. History of alcohol dependence. He presently does not she shows clinical signs or symptoms of withdrawal, place him on CIWA.  5. Generalized anxiety disorder. Will continue Klonopin at 1 mg by mouth 3 times a day as needed 6. DVT prophylaxis. Subcutaneous heparin   Code Status: Full Code Family Communication: Family not present  Disposition Plan: Admit to med/surg anticipate he will require at least nights hospitalizaiton  Time spent:70 min  Kelvin Cellar [facility] Pager [phone]

## 2013-12-18 NOTE — ED Notes (Signed)
[facility] sts he stayed in the hospital x 1 week and has been taking abx x 1 week at home. sts infection is worse and pain 10/10 in left foot.

## 2013-12-18 NOTE — ED Notes (Signed)
Wound care consult at bedside; wrapped the lower leg in gauze untli mri is done.

## 2013-12-18 NOTE — Progress Notes (Signed)
  CARE MANAGEMENT ED NOTE 12/18/2013  Patient:  [name], [name]   Account Number:  [id number]  Date Initiated:  12/18/2013  Documentation initiated by:  Jackelyn Poling  Subjective/Objective Assessment:   48 yr old medicare [place] pt recently admitted to [facility] hospital for left foot infection. sts he stayed in the hospital x 1 week & has been taking abx x 1 week at home. sts infection is worse and pain 10/10 in left foot.       Subjective/Objective Assessment Detail:   no pcp (medicare listed)  Pt [facility]  Pt reports having walker, cane and wheelchair at home     Action/Plan:   ED Cm noted Cm consult for home health services for pt  1317 CM spoke with pt via telephone in his room to assess his needs & choice of home health agency   Action/Plan Detail:   1319 ED Cm spoke with Amy at [facility] to call in referral for home health Pt will be followed while he is hospitalized for any further needs   Anticipated DC Date:  12/21/2013     Status Recommendation to Physician:   Result of Recommendation:    Other ED Services  Consult Working [place]  Other  Outpatient Services - Pt will follow up   [place]   Choice offered to / List presented to:  C-1 Patient         [place].    Status of service:  Completed, signed off  ED Comments:   ED Comments Detail:

## 2013-12-18 NOTE — Consult Note (Addendum)
WOC wound consult note Reason for Consult: Consult requested for left foot and leg wounds. Pt was seen by Dr Sharol Given today in the office. Wound type: Previously full thickness to left plantar foot is dry scab, no open wound or drainage. Left leg with yellow gelatinous nonviable tissue extending from ankle to upper calf.   Measurement:Left foot scab 1.5X2cm Drainage (amount, consistency, odor) Large amt yellow drainage from calf areas Periwound: Previous blistered areas have ruptured and peeled.  Dry peeling skin and generalized edema and erythremia extends to upper thigh and has been marked. Dressing procedure/placement/frequency: Xeroform to promote healing; this can be applied by the bedside nurse after MRI has been completed today.  Applied ABD pads and kerlex to absorb drainage at this time. Dr Sharol Given can provide further orders for topical treatment if desired tomorrow. Julien Girt MSN, RN, [place], [place], [place]

## 2013-12-18 NOTE — Progress Notes (Signed)
ANTIBIOTIC CONSULT NOTE - INITIAL  Pharmacy Consult for Vancomycin, Cefepime Indication: Cellulitis, wound infection  Allergies  Allergen Reactions  . Benadryl [Diphenhydramine Hcl] Other (See Comments)    Pt states leg spasm  . Trazodone And Nefazodone Other (See Comments)    Leg Spasms.     Patient Measurements:   Adjusted Body Weight:   Vital Signs: Temp: 99.1 F (37.3 C) (11/30 1425) Temp Source: Oral (11/30 1425) BP: 131/70 mmHg (11/30 1425) Pulse Rate: 90 (11/30 1425) Intake/Output from previous day:   Intake/Output from this shift:    Labs:  Recent Labs  12/18/13 1030  WBC 13.3*  HGB 10.4*  PLT 462*  CREATININE 1.07   CrCl cannot be calculated (Unknown ideal weight.). No results for input(s): VANCOTROUGH, VANCOPEAK, VANCORANDOM, GENTTROUGH, GENTPEAK, GENTRANDOM, TOBRATROUGH, TOBRAPEAK, TOBRARND, AMIKACINPEAK, AMIKACINTROU, AMIKACIN in the last 72 hours.   Microbiology: No results found for this or any previous visit (from the past 720 hour(s)).  Medical History: Past Medical History  Diagnosis Date  . Diabetic foot ulcer   . ETOH abuse   . Anxiety   . Open wound     bottom of foot  . Diabetes mellitus without complication     borderline  . Neuromuscular disorder     neuropathy  . GERD (gastroesophageal reflux disease)     tums  . Pneumonia ~ 2012  . History of blood transfusion     "related to left knee OR; probably right hip too" (04/21/2012)  . Stroke 2008    "they said I might have had one during right hip replacement" (04/21/2012)  . Arthritis     "everywhere" (04/21/2012)  . Mental disorder   . Depression   . DDD (degenerative disc disease)     Medications:  Scheduled:  . ceFEPime (MAXIPIME) IV  2 g Intravenous Q12H  . [START ON 12/19/2013] escitalopram  20 mg Oral q morning - 24M  . folic acid  1 mg Oral Daily  . gabapentin  400 mg Oral QID  . heparin  5,000 Units Subcutaneous 3 times per day  . insulin aspart  0-5 Units Subcutaneous  QHS  . methocarbamol  500 mg Oral TID  . multivitamin with minerals  1 tablet Oral Daily  . pantoprazole  40 mg Oral Daily  . QUEtiapine  200 mg Oral QHS  . sodium chloride  3 mL Intravenous Q12H  . thiamine  100 mg Oral Daily   Or  . thiamine  100 mg Intravenous Daily   Assessment: 48yo male with cellulitis, wound infection- worsening since d/c from [facility] last week on Clindamycin.  Xray (-)osteomyelitis.  WBC 13.3, estimated CrCl > 100.  Pt has received no Vancomycin at this time  Goal of Therapy:  Vancomycin trough level 10-15 mcg/ml  Plan:  -Cefepime 2g IV q12 - Vancomycin 1g IV q8 - Watch renal fxn - Steady-state Vanc trough when appropriate  Gracy Bruins, PharmD [facility]

## 2013-12-18 NOTE — ED Provider Notes (Signed)
CSN: [id number]     Arrival date & time 12/18/13  1962 History   First MD Initiated Contact with Patient 12/18/13 [phone]     Chief Complaint  Patient presents with  . Wound Infection  . Foot Pain     (Consider location/radiation/quality/duration/timing/severity/associated sxs/prior Treatment) HPI Comments: [name] is a 48 y.o. male with a PMHx of DM2, neuropathy, diabetic foot ulcer, EtOH abuse, anxiety, depression, GERD, arthritis, and DDD, who presents to the ED with complaints of L foot ulcer x2 weeks for which he was hospitalized in [facility] last week, and has been taking clindamycin and Ultram 1 week since his discharge. He reports that the pain in his L foot has increased since his discharge one week ago, and that the weeping from his wounds has increased. He reports the pain is 10/10 stabbing/burning, constant, radiating up his leg into his medial thigh, worse with walking, and unrelieved by Ultram. He reports that he has had erythema to his L thigh/foot, which is better than when he initially was admitted, but since discharge he has not noticed any improvement. He has noticed that the skin to his left foot has begun to turn white and peel off. He states his wound care nurse came last Wednesday and noted his temp to be 100.71F. States today he saw Dr. Sharol Given ([facility]) and was sent here for admission. Associated symptoms include paresthesias in his foot, which he has had since before admission last week. Denies CP, SOB, abd pain, n/v/d/c, urinary symptoms, numbness, or weakness. Does not take any medications for diabetes, states he was told his insurance company needed to review it. He does not take his blood sugars regularly. Denies IVDU.  Patient is a 48 y.o. male presenting with lower extremity pain. The history is provided by the patient. No language interpreter was used.  Foot Pain This is a recurrent problem. The current episode started 1 to 4 weeks ago. The problem occurs  constantly. The problem has been gradually worsening. Associated symptoms include arthralgias (L foot), chills, a fever (TMax 100.4 on Wednesday) and myalgias (L leg). Pertinent negatives include no abdominal pain, chest pain, headaches, nausea, numbness, urinary symptoms, vomiting or weakness. The symptoms are aggravated by walking. He has tried oral narcotics (clindamycin and ultram) for the symptoms. The treatment provided no relief.    Past Medical History  Diagnosis Date  . Diabetic foot ulcer   . ETOH abuse   . Anxiety   . Open wound     bottom of foot  . Diabetes mellitus without complication     borderline  . Neuromuscular disorder     neuropathy  . GERD (gastroesophageal reflux disease)     tums  . Pneumonia ~ 2012  . History of blood transfusion     "related to left knee OR; probably right hip too" (04/21/2012)  . Stroke 2008    "they said I might have had one during right hip replacement" (04/21/2012)  . Arthritis     "everywhere" (04/21/2012)  . Mental disorder   . Depression   . DDD (degenerative disc disease)    Past Surgical History  Procedure Laterality Date  . Total hip arthroplasty Right 2008  . Total knee arthroplasty Left 2006  . Joint replacement    . Lung lobectomy Left ~ 2006  . Revision total hip arthroplasty Right 2008    "4-5 months after replacement" (04/21/2012)  . Knee arthroscopy Bilateral 1980's/1990's  . Lung lobectomy    .  Metatarsal osteotomy  10/29/2011    Procedure: METATARSAL OSTEOTOMY;  Surgeon: [name], MD;  Location: [place];  Service: Orthopedics;  Laterality: Left;  Left 1st Metatarsal Dorsal Closing Wedge   . Total knee arthroplasty Right 04/20/2012  . Total knee arthroplasty Right 04/20/2012    Procedure: TOTAL KNEE ARTHROPLASTY;  Surgeon: [name], MD;  Location: [place];  Service: Orthopedics;  Laterality: Right;  Right Total Knee Arthroplasty   History reviewed. No pertinent family history. History  Substance Use Topics  .  Smoking status: Current Every Day Smoker -- 1.00 packs/day for 30 years    Types: Cigarettes  . Smokeless tobacco: Never Used     Comment: 04/21/2012 offered smoking cessation materials; pt declines  . Alcohol Use: No     Comment: last used march 11    Review of Systems  Constitutional: Positive for fever (TMax 100.4 on Wednesday) and chills.  Respiratory: Negative for shortness of breath.   Cardiovascular: Negative for chest pain.  Gastrointestinal: Negative for nausea, vomiting, abdominal pain, diarrhea and constipation.  Genitourinary: Negative for dysuria and hematuria.  Musculoskeletal: Positive for myalgias (L leg) and arthralgias (L foot). Negative for back pain.  Skin: Positive for color change and wound.  Allergic/Immunologic: Positive for immunocompromised state.  Neurological: Negative for weakness, numbness and headaches.  Psychiatric/Behavioral: Negative for confusion.   10 Systems reviewed and are negative for acute change except as noted in the HPI.    Allergies  Benadryl and Trazodone and nefazodone  Home Medications   Prior to Admission medications   Medication Sig Start Date End Date Taking? Authorizing Provider  clonazePAM (KLONOPIN) 1 MG tablet Take 1 mg by mouth 4 (four) times daily.    Historical Provider, MD  cyclobenzaprine (FLEXERIL) 10 MG tablet Take 1 tablet (10 mg total) by mouth 2 (two) times daily as needed for muscle spasms. 12/03/13   Nat Christen, MD  escitalopram (LEXAPRO) 20 MG tablet Take 1 tablet (20 mg total) by mouth every morning. For depression 03/29/13   Encarnacion Slates, NP  gabapentin (NEURONTIN) 400 MG capsule Take 1 capsule (400 mg total) by mouth 4 (four) times daily. For substance withdrawal syndrome 03/29/13   Encarnacion Slates, NP  ibuprofen (ADVIL,MOTRIN) 200 MG tablet Take 400 mg by mouth every 6 (six) hours as needed for moderate pain.    Historical Provider, MD  ibuprofen (ADVIL,MOTRIN) 600 MG tablet Take 1 tablet (600 mg total) by mouth  every 6 (six) hours as needed for moderate pain. Patient not taking: Reported on 11/21/2013 07/12/13   Evalee Jefferson, PA-C  ibuprofen (ADVIL,MOTRIN) 800 MG tablet Take 1 tablet (800 mg total) by mouth 3 (three) times daily. Patient not taking: Reported on 11/21/2013 10/04/13   Carman Ching, PA-C  methocarbamol (ROBAXIN) 500 MG tablet Take 1 tablet (500 mg total) by mouth 3 (three) times daily. Patient not taking: Reported on 12/03/2013 11/21/13   Lenox Ahr, PA-C  mirtazapine (REMERON) 30 MG tablet Take 30 mg by mouth at bedtime.    Historical Provider, MD  nicotine (NICODERM CQ - DOSED IN MG/24 HOURS) 21 mg/24hr patch Place 1 patch (21 mg total) onto the skin daily. Patient not taking: Reported on 11/21/2013 07/20/13   Velta Addison Mikhail, DO  oxyCODONE-acetaminophen (PERCOCET) 5-325 MG per tablet Take 2 tablets by mouth every 4 (four) hours as needed. 12/03/13   Nat Christen, MD  oxyCODONE-acetaminophen (PERCOCET) 5-325 MG per tablet Take 2 tablets by mouth every 4 (four) hours  as needed. 12/03/13   Nat Christen, MD  oxyCODONE-acetaminophen (PERCOCET/ROXICET) 5-325 MG per tablet Take 1 tablet by mouth every 4 (four) hours as needed. Patient not taking: Reported on 11/21/2013 07/20/13   Velta Addison Mikhail, DO  QUEtiapine (SEROQUEL) 200 MG tablet Take 1 tablet (200 mg total) by mouth at bedtime. For mood control 03/29/13   Encarnacion Slates, NP  silver sulfADIAZINE (SILVADENE) 1 % cream Apply topically 2 (two) times daily. Patient not taking: Reported on 11/21/2013 07/20/13   Maryann Mikhail, DO   BP 110/64 mmHg  Pulse 110  Temp(Src) 99.3 F (37.4 C)  Resp 18  SpO2 100% Physical Exam  Constitutional: He is oriented to person, place, and time. He appears well-developed and well-nourished.  Non-toxic appearance. No distress.  Mildly tachycardic, otherwise VSS. Afebrile and nontoxic.   HENT:  Head: Normocephalic and atraumatic.  Mouth/Throat: Oropharynx is clear and moist and mucous membranes are normal.  Eyes:  Conjunctivae and EOM are normal. Right eye exhibits no discharge. Left eye exhibits no discharge.  Neck: Normal range of motion. Neck supple.  Cardiovascular: Regular rhythm, normal heart sounds and intact distal pulses.  Tachycardia present.  Exam reveals no gallop and no friction rub.   No murmur heard. Tachycardic  Pulmonary/Chest: Effort normal and breath sounds normal. No respiratory distress. He has no decreased breath sounds. He has no wheezes. He has no rhonchi. He has no rales.  Abdominal: Soft. Normal appearance and bowel sounds are normal. He exhibits no distension. There is no tenderness. There is no rigidity, no rebound, no guarding, no tenderness at McBurney's point and negative Murphy's sign.  Musculoskeletal:       Left ankle: He exhibits decreased range of motion and swelling. He exhibits normal pulse. Achilles tendon normal.       Left foot: There is tenderness and swelling.  L ankle with ulcerative wounds with white peeling skin covering bilateral malleoli, with erythema extending from digits up calf and into medial thigh. Warm to touch, TTP. ROM to ankle limited due to pain. L calf/foot mildly swollen. Cap refill present. Distal pulses palpable. Strength 5/5 in all extremities, sensation diminished in L foot but grossly intact.  Neurological: He is alert and oriented to person, place, and time. He has normal strength.  Skin: Skin is warm and dry. No rash noted. There is erythema.  L foot wound and cellulitis as above  Psychiatric: He has a normal mood and affect.  Nursing note and vitals reviewed.   ED Course  Procedures (including critical care time) Labs Review Labs Reviewed  CBC WITH DIFFERENTIAL - Abnormal; Notable for the following:    WBC 13.3 (*)    Hemoglobin 10.4 (*)    HCT 32.4 (*)    MCV 76.8 (*)    MCH 24.6 (*)    RDW 18.3 (*)    Platelets 462 (*)    Neutro Abs 10.0 (*)    Monocytes Absolute 1.2 (*)    All other components within normal limits  BASIC  METABOLIC PANEL - Abnormal; Notable for the following:    Sodium 133 (*)    Chloride 95 (*)    BUN 5 (*)    GFR calc non Af Amer 79 (*)    All other components within normal limits  CULTURE, BLOOD (ROUTINE X 2)  CULTURE, BLOOD (ROUTINE X 2)  SEDIMENTATION RATE  C-REACTIVE PROTEIN  URINALYSIS, ROUTINE W REFLEX MICROSCOPIC    Imaging Review Dg Ankle Complete Left  12/18/2013  CLINICAL DATA:  Diabetic foot.  Cellulitis  EXAM: LEFT ANKLE COMPLETE - 3+ VIEW  COMPARISON:  06/30/2013  FINDINGS: Diffuse soft tissue swelling has progressed. Joint effusion is noted. Joint space is normal.  Negative for fracture or osteomyelitis.  First metatarsal screw again noted.  IMPRESSION: Diffuse soft tissue swelling and joint effusion. Negative for fracture or osteomyelitis.   Electronically Signed   By: [name] Gallo M.D.   On: 12/18/2013 11:31   Dg Foot Complete Left  12/18/2013   CLINICAL DATA:  Diabetic ulcer of foot, wound cellulitis  EXAM: LEFT FOOT - COMPLETE 3+ VIEW  COMPARISON:  10/04/2013  FINDINGS: Prior fusion of first TMT joint.  Diffuse osseous demineralization.  Scattered soft tissue swelling.  Remaining joint spaces preserved.  No acute fracture, dislocation, or bone destruction.  Specifically, no soft tissue gas or radiographic evidence of osteomyelitis identified.  IMPRESSION: Osseous demineralization with diffuse soft tissue swelling LEFT foot.  Post first TMT joint fusion.  No acute osseous abnormalities.   Electronically Signed   By: Lavonia Dana M.D.   On: 12/18/2013 11:32     EKG Interpretation None      MDM   Final diagnoses:  Wound cellulitis  Type 2 diabetes mellitus with left diabetic foot ulcer    48 y.o. male with L foot infection. Discharged from [facility] hospital last week. States he had an MRI there. Dr. Sharol Given sent pt here for admission. Called Dr. Jess Barters office, they stated they would follow along with care but would like medical admission. Will obtain labs and xrays  to r/o osteo, and admit for cellulitis.   12:22 PM Tachycardia resolved with fluids. CBC w/diff showing leukocytosis 13.3 and H/H dropped from prior results now Hgb 10.4. BMP showing Na 133, Cl 95. Xrays without evidence of osteo. CRP/ESR pending. U/A pending. Will consult for admission. Will start abx.  12:37 PM Dr. Coralyn Pear returning page. Will admit. Please see his for dictation further documentation of care.  Patty Sermons [place], [place] 12/18/13 1237  Merryl Hacker, MD 12/18/13 [phone]

## 2013-12-19 ENCOUNTER — Encounter (HOSPITAL_COMMUNITY): Payer: Self-pay | Admitting: Infectious Diseases

## 2013-12-19 DIAGNOSIS — M86462 Chronic osteomyelitis with draining sinus, left tibia and fibula: Secondary | ICD-10-CM

## 2013-12-19 DIAGNOSIS — L02619 Cutaneous abscess of unspecified foot: Secondary | ICD-10-CM

## 2013-12-19 DIAGNOSIS — L03119 Cellulitis of unspecified part of limb: Secondary | ICD-10-CM

## 2013-12-19 HISTORY — DX: Cellulitis of unspecified part of limb: L03.119

## 2013-12-19 HISTORY — DX: Cutaneous abscess of unspecified foot: L02.619

## 2013-12-19 LAB — COMPREHENSIVE METABOLIC PANEL
ALBUMIN: 1.8 g/dL — AB (ref 3.5–5.2)
ALT: 35 U/L (ref 0–53)
AST: 41 U/L — ABNORMAL HIGH (ref 0–37)
Alkaline Phosphatase: 87 U/L (ref 39–117)
Anion gap: 9 (ref 5–15)
BUN: 6 mg/dL (ref 6–23)
CO2: 26 mEq/L (ref 19–32)
CREATININE: 0.88 mg/dL (ref 0.50–1.35)
Calcium: 8.2 mg/dL — ABNORMAL LOW (ref 8.4–10.5)
Chloride: 101 mEq/L (ref 96–112)
GFR calc Af Amer: >90 mL/min (ref >90)
GFR calc non Af Amer: >90 mL/min (ref >90)
Glucose, Bld: 101 mg/dL — ABNORMAL HIGH (ref 70–99)
Potassium: 3.9 mEq/L (ref 3.7–5.3)
Sodium: 136 mEq/L — ABNORMAL LOW (ref 137–147)
Total Bilirubin: 0.3 mg/dL (ref 0.3–1.2)
Total Protein: 7 g/dL (ref 6.0–8.3)

## 2013-12-19 LAB — HEMOGLOBIN A1C
Hgb A1c MFr Bld: 7.7 % — ABNORMAL HIGH (ref <5.7)
Mean Plasma Glucose: 174 mg/dL — ABNORMAL HIGH (ref <117)

## 2013-12-19 LAB — IRON AND TIBC: UIBC: 156 ug/dL (ref 125–400)

## 2013-12-19 LAB — FERRITIN: FERRITIN: 284 ng/mL (ref 22–322)

## 2013-12-19 LAB — GLUCOSE, CAPILLARY
GLUCOSE-CAPILLARY: 117 mg/dL — AB (ref 70–99)
GLUCOSE-CAPILLARY: 144 mg/dL — AB (ref 70–99)
Glucose-Capillary: 91 mg/dL (ref 70–99)
Glucose-Capillary: 134 mg/dL — ABNORMAL HIGH (ref 70–99)
Glucose-Capillary: 121 mg/dL — ABNORMAL HIGH (ref 70–99)

## 2013-12-19 LAB — CBC
HCT: 30 % — ABNORMAL LOW (ref 39.0–52.0)
Hemoglobin: 9.5 g/dL — ABNORMAL LOW (ref 13.0–17.0)
MCH: 24.5 pg — ABNORMAL LOW (ref 26.0–34.0)
MCHC: 31.7 g/dL (ref 30.0–36.0)
MCV: 77.3 fL — ABNORMAL LOW (ref 78.0–100.0)
Platelets: 393 10*3/uL (ref 150–400)
RBC: 3.88 MIL/uL — ABNORMAL LOW (ref 4.22–5.81)
RDW: 18.4 % — ABNORMAL HIGH (ref 11.5–15.5)
WBC: 7.5 10*3/uL (ref 4.0–10.5)

## 2013-12-19 LAB — SEDIMENTATION RATE: Sed Rate: 110 mm/hr — ABNORMAL HIGH (ref 0–16)

## 2013-12-19 LAB — C-REACTIVE PROTEIN: CRP: 12.2 mg/dL — ABNORMAL HIGH (ref <0.60)

## 2013-12-19 MED ORDER — MORPHINE SULFATE 4 MG/ML IJ SOLN
4.0000 mg | INTRAMUSCULAR | Status: DC | PRN
  Administered 2013-12-19 – 2013-12-21 (×10): 4 mg via INTRAVENOUS
  Filled 2013-12-19 (×10): qty 1

## 2013-12-19 MED ORDER — LIVING WELL WITH DIABETES BOOK
Freq: Once | Status: DC
  Filled 2013-12-19: qty 1

## 2013-12-19 NOTE — Consult Note (Addendum)
WOC follow-up: Dr Sharol Given following for assessment and plan of care to left foot and leg. He has provided dressing change orders for bedside nurse. Please re-consult if further assistance is needed.  Thank-you,  Julien Girt MSN, [place], [place], [place], [place]

## 2013-12-19 NOTE — Care Management Note (Unsigned)
    Page 1 of 1   12/19/2013     3:32:29 PM CARE MANAGEMENT NOTE 12/19/2013  Patient:  [name], [name]   Account Number:  [id number]  Date Initiated:  12/19/2013  Documentation initiated by:  Tomi Bamberger  Subjective/Objective Assessment:   dx diabetic foot with cellulitis  admit- lives alone.     Action/Plan:   Anticipated DC Date:  12/21/2013   Anticipated DC Plan:  [place]  CM consult      [facility] Choice  HOME HEALTH   Choice offered to / List presented to:  C-1 Patient        [place] arranged  HH-1 RN      [place].   Status of service:  In process, will continue to follow Medicare Important Message given?   (If response is "NO", the following Medicare IM given date fields will be blank) Date Medicare IM given:   Medicare IM given by:   Date Additional Medicare IM given:   Additional Medicare IM given by:    Discharge Disposition:    Per UR Regulation:  Reviewed for med. necessity/level of care/duration of stay  If discussed at [place] of Stay Meetings, dates discussed:    Comments:  12/19/13 [place], BSN [phone] patient lives alone, ID following, patient may need hhiv abx's,  patient states he would like to work with [facility] , since he work with them in the past.  Referral made to [facility] for [facility] for possible iv abx.  NCM will continue to follow.

## 2013-12-19 NOTE — Progress Notes (Signed)
Utilization review completed.  

## 2013-12-19 NOTE — Progress Notes (Addendum)
Inpatient Diabetes Program Recommendations  AACE/ADA: New Consensus Statement on Inpatient Glycemic Control (2013)  Target Ranges:  Prepandial:   less than 140 mg/dL      Peak postprandial:   less than 180 mg/dL (1-2 hours)      Critically ill patients:  140 - 180 mg/dL     Results for [name], [name] (MRN [id number]) as of 12/19/2013 08:06  Ref. Range 12/18/2013 17:14 12/18/2013 22:03  Glucose-Capillary Latest Range: 70-99 mg/dL 94 117 (H)    Results for [name], [name] (MRN [id number]) as of 12/19/2013 08:06  Ref. Range 12/18/2013 17:26  Hgb A1c MFr Bld Latest Range: <5.7 % 7.7 (H)     Admitted with L LE Cellulitis.  History of DM, ETOH abuse.  Patient does NOT have PCP.  Home DM Meds: None  Current DM Orders: HS SSI scale only    MD- Patient currently only has orders for bedtime SSI that starts coverage at 201 mg/dl for bedtime CBG only.    Patient's CBGs look OK right now.    If CBGs become elevated, please consider adding Novolog Sensitive SSI tid with meals  Patient may need oral DM medication for home.  Metformin may not be a good option if patient drinks ETOH on a regular basis.  Could try a DPP-4 inhibitor like Tradjenta 5 mg daily.  Tradjenta works in glucose dependent manner and may pose less of a risk of hypoglycemia for this patient.  Patient will also need CBG meter Rx at time of d/c/.  Use Order # [id number] for CBG meter RX.      Addendum 10am: Spoke with patient about his DM care at home.  Patient told me he has "borderline DM" and has never been diagnosed with DM before.  Spoke to patient about his current A1c of 7.7%.  Explained what an A1c is and what it measures.  Reminded patient that his goal A1c is 7% or less per ADA standards to prevent both acute and long-term complications.  Explained to patient that an A1c of 7.7% indicates patient DOES have DM and that he may need some type of oral DM medication for home to help better control his CBGs and help heal his LE  infection.    Spoke with pt about new diagnosis.  Explained what an A1C is, basic pathophysiology of DM Type 2, basic home care, importance of checking CBGs and maintaining good CBG control to prevent long-term and short-term complications.  RNs to provide ongoing basic DM education at bedside with this patient.  Have ordered educational booklet and DM videos.  Have also ordered RD consult for DM diet education as well.  Patient told me he drinks a lot of sweet tea at home and does not follow a DM diet plan.  Discussed with patient that most foods (with the exception of meats and fats) have carbohydrates and that carbohydrates turn into sugar in the stomach and are released into the bloodstream.  Encouraged patient to avoid beverages with sugar (regular soda, sweet tea, fruit juice, juice drinks) and to monitor his portion sizes.  Will have RD visit with patient to review more in depth about proper DM nutrition plan at home.      Will follow Wyn Quaker RN, MSN, CDE Diabetes Coordinator Inpatient Diabetes Program Team Pager: [phone] (8a-10p)

## 2013-12-19 NOTE — Progress Notes (Signed)
[facility]  Patient Status: Active pt with [facility] prior to this admission  [facility] is providing the following services: HHRN prior to admission. Pt will also have Home Infusion Pharmacy services added at DC for home IV ABX.  [facility] hospital team will follow pt while inpatient and support transition home when ordered.  If patient discharges after hours, please call [phone].   Larry Sierras 12/19/2013, 4:20 PM

## 2013-12-19 NOTE — Consult Note (Signed)
Reason for Consult: Cellulitis left lower extremity Referring Physician: Dr. Rosiland Oz Norwood is an 48 y.o. male.  HPI: Patient is a 48 year old gentleman who was hospitalized for one week for infection left lower extremity. Patient has been discharged to home for approximately 1 week at this time when he presented with cellulitis involving the entire left foot and leg with cellulitis extending up to the medial aspect of the left thigh.  Past Medical History  Diagnosis Date  . Diabetic foot ulcer   . ETOH abuse   . Anxiety   . Open wound     bottom of foot  . Diabetes mellitus without complication     borderline  . Neuromuscular disorder     neuropathy  . GERD (gastroesophageal reflux disease)     tums  . Pneumonia ~ 2012  . History of blood transfusion     "related to left knee OR; probably right hip too" (04/21/2012)  . Stroke 2008    "they said I might have had one during right hip replacement" (04/21/2012)  . Arthritis     "everywhere" (04/21/2012)  . Mental disorder   . Depression   . DDD (degenerative disc disease)     Past Surgical History  Procedure Laterality Date  . Total hip arthroplasty Right 2008  . Total knee arthroplasty Left 2006  . Joint replacement    . Lung lobectomy Left ~ 2006  . Revision total hip arthroplasty Right 2008    "4-5 months after replacement" (04/21/2012)  . Knee arthroscopy Bilateral 1980's/1990's  . Lung lobectomy    . Metatarsal osteotomy  10/29/2011    Procedure: METATARSAL OSTEOTOMY;  Surgeon: [name], MD;  Location: [facility];  Service: Orthopedics;  Laterality: Left;  Left 1st Metatarsal Dorsal Closing Wedge   . Total knee arthroplasty Right 04/20/2012  . Total knee arthroplasty Right 04/20/2012    Procedure: TOTAL KNEE ARTHROPLASTY;  Surgeon: [name], MD;  Location: [facility];  Service: Orthopedics;  Laterality: Right;  Right Total Knee Arthroplasty    History reviewed. No pertinent family history.  Social History:  reports that  he has been smoking Cigarettes.  He has a 30 pack-year smoking history. He has never used smokeless tobacco. He reports that he uses illicit drugs (Marijuana and Cocaine). He reports that he does not drink alcohol.  Allergies:  Allergies  Allergen Reactions  . Benadryl [Diphenhydramine Hcl] Other (See Comments)    Pt states leg spasm  . Trazodone And Nefazodone Other (See Comments)    Leg Spasms.     Medications: I have reviewed the patient's current medications.  Results for orders placed or performed during the hospital encounter of 12/18/13 (from the past 48 hour(s))  Sedimentation rate     Status: Abnormal   Collection Time: 12/18/13 10:30 AM  Result Value Ref Range   Sed Rate 104 (H) 0 - 16 mm/hr  C-reactive protein     Status: Abnormal   Collection Time: 12/18/13 10:30 AM  Result Value Ref Range   CRP 14.9 (H) <0.60 mg/dL    Comment: Performed at [facility]  CBC with Differential     Status: Abnormal   Collection Time: 12/18/13 10:30 AM  Result Value Ref Range   WBC 13.3 (H) 4.0 - 10.5 K/uL   RBC 4.22 4.22 - 5.81 MIL/uL   Hemoglobin 10.4 (L) 13.0 - 17.0 g/dL   HCT 32.4 (L) 39.0 - 52.0 %   MCV 76.8 (L) 78.0 -  100.0 fL   MCH 24.6 (L) 26.0 - 34.0 pg   MCHC 32.1 30.0 - 36.0 g/dL   RDW 18.3 (H) 11.5 - 15.5 %   Platelets 462 (H) 150 - 400 K/uL   Neutrophils Relative % 74 43 - 77 %   Neutro Abs 10.0 (H) 1.7 - 7.7 K/uL   Lymphocytes Relative 16 12 - 46 %   Lymphs Abs 2.1 0.7 - 4.0 K/uL   Monocytes Relative 9 3 - 12 %   Monocytes Absolute 1.2 (H) 0.1 - 1.0 K/uL   Eosinophils Relative 1 0 - 5 %   Eosinophils Absolute 0.1 0.0 - 0.7 K/uL   Basophils Relative 0 0 - 1 %   Basophils Absolute 0.0 0.0 - 0.1 K/uL  Basic metabolic panel     Status: Abnormal   Collection Time: 12/18/13 10:30 AM  Result Value Ref Range   Sodium 133 (L) 137 - 147 mEq/L   Potassium 4.1 3.7 - 5.3 mEq/L   Chloride 95 (L) 96 - 112 mEq/L   CO2 26 19 - 32 mEq/L   Glucose, Bld 97 70 - 99 mg/dL    BUN 5 (L) 6 - 23 mg/dL   Creatinine, Ser 1.07 0.50 - 1.35 mg/dL   Calcium 9.0 8.4 - 10.5 mg/dL   GFR calc non Af Amer 79 (L) >90 mL/min   GFR calc Af Amer >90 >90 mL/min    Comment: (NOTE) The eGFR has been calculated using the CKD EPI equation. This calculation has not been validated in all clinical situations. eGFR's persistently <90 mL/min signify possible Chronic Kidney Disease.    Anion gap 12 5 - 15  Urinalysis, Routine w reflex microscopic     Status: Abnormal   Collection Time: 12/18/13  2:02 PM  Result Value Ref Range   Color, Urine AMBER (A) YELLOW    Comment: BIOCHEMICALS MAY BE AFFECTED BY COLOR   APPearance CLOUDY (A) CLEAR   Specific Gravity, Urine 1.017 1.005 - 1.030   pH 5.5 5.0 - 8.0   Glucose, UA 100 (A) NEGATIVE mg/dL   Hgb urine dipstick NEGATIVE NEGATIVE   Bilirubin Urine SMALL (A) NEGATIVE   Ketones, ur NEGATIVE NEGATIVE mg/dL   Protein, ur NEGATIVE NEGATIVE mg/dL   Urobilinogen, UA 0.2 0.0 - 1.0 mg/dL   Nitrite NEGATIVE NEGATIVE   Leukocytes, UA NEGATIVE NEGATIVE    Comment: MICROSCOPIC NOT DONE ON URINES WITH NEGATIVE PROTEIN, BLOOD, LEUKOCYTES, NITRITE, OR GLUCOSE <1000 mg/dL.  Glucose, capillary     Status: None   Collection Time: 12/18/13  5:14 PM  Result Value Ref Range   Glucose-Capillary 94 70 - 99 mg/dL  CBC     Status: Abnormal   Collection Time: 12/18/13  5:26 PM  Result Value Ref Range   WBC 10.5 4.0 - 10.5 K/uL   RBC 4.03 (L) 4.22 - 5.81 MIL/uL   Hemoglobin 9.9 (L) 13.0 - 17.0 g/dL   HCT 31.0 (L) 39.0 - 52.0 %   MCV 76.9 (L) 78.0 - 100.0 fL   MCH 24.6 (L) 26.0 - 34.0 pg   MCHC 31.9 30.0 - 36.0 g/dL   RDW 18.4 (H) 11.5 - 15.5 %   Platelets 440 (H) 150 - 400 K/uL  Creatinine, serum     Status: None   Collection Time: 12/18/13  5:26 PM  Result Value Ref Range   Creatinine, Ser 0.84 0.50 - 1.35 mg/dL   GFR calc non Af Amer >90 >90 mL/min   GFR calc Af  Amer >90 >90 mL/min    Comment: (NOTE) The eGFR has been calculated using the CKD  EPI equation. This calculation has not been validated in all clinical situations. eGFR's persistently <90 mL/min signify possible Chronic Kidney Disease.   Hemoglobin A1c     Status: Abnormal   Collection Time: 12/18/13  5:26 PM  Result Value Ref Range   Hgb A1c MFr Bld 7.7 (H) <5.7 %    Comment: (NOTE)                                                                       According to the ADA Clinical Practice Recommendations for 2011, when HbA1c is used as a screening test:  >=6.5%   Diagnostic of Diabetes Mellitus           (if abnormal result is confirmed) 5.7-6.4%   Increased risk of developing Diabetes Mellitus References:Diagnosis and Classification of Diabetes Mellitus,Diabetes FKCL,2751,70(YFVCB 1):S62-S69 and Standards of Medical Care in         Diabetes - 2011,Diabetes [id number],7591,63 (Suppl 1):S11-S61.    Mean Plasma Glucose 174 (H) <117 mg/dL    Comment: Performed at [facility]  Glucose, capillary     Status: Abnormal   Collection Time: 12/18/13 10:03 PM  Result Value Ref Range   Glucose-Capillary 117 (H) 70 - 99 mg/dL  CBC     Status: Abnormal   Collection Time: 12/19/13  4:16 AM  Result Value Ref Range   WBC 7.5 4.0 - 10.5 K/uL   RBC 3.88 (L) 4.22 - 5.81 MIL/uL   Hemoglobin 9.5 (L) 13.0 - 17.0 g/dL   HCT 30.0 (L) 39.0 - 52.0 %   MCV 77.3 (L) 78.0 - 100.0 fL   MCH 24.5 (L) 26.0 - 34.0 pg   MCHC 31.7 30.0 - 36.0 g/dL   RDW 18.4 (H) 11.5 - 15.5 %   Platelets 393 150 - 400 K/uL  Comprehensive metabolic panel     Status: Abnormal   Collection Time: 12/19/13  4:16 AM  Result Value Ref Range   Sodium 136 (L) 137 - 147 mEq/L   Potassium 3.9 3.7 - 5.3 mEq/L   Chloride 101 96 - 112 mEq/L   CO2 26 19 - 32 mEq/L   Glucose, Bld 101 (H) 70 - 99 mg/dL   BUN 6 6 - 23 mg/dL   Creatinine, Ser 0.88 0.50 - 1.35 mg/dL   Calcium 8.2 (L) 8.4 - 10.5 mg/dL   Total Protein 7.0 6.0 - 8.3 g/dL   Albumin 1.8 (L) 3.5 - 5.2 g/dL   AST 41 (H) 0 - 37 U/L   ALT 35 0 - 53 U/L    Alkaline Phosphatase 87 39 - 117 U/L   Total Bilirubin 0.3 0.3 - 1.2 mg/dL   GFR calc non Af Amer >90 >90 mL/min   GFR calc Af Amer >90 >90 mL/min    Comment: (NOTE) The eGFR has been calculated using the CKD EPI equation. This calculation has not been validated in all clinical situations. eGFR's persistently <90 mL/min signify possible Chronic Kidney Disease.    Anion gap 9 5 - 15    Dg Ankle Complete Left  12/18/2013   CLINICAL DATA:  Diabetic foot.  Cellulitis  EXAM:  LEFT ANKLE COMPLETE - 3+ VIEW  COMPARISON:  06/30/2013  FINDINGS: Diffuse soft tissue swelling has progressed. Joint effusion is noted. Joint space is normal.  Negative for fracture or osteomyelitis.  First metatarsal screw again noted.  IMPRESSION: Diffuse soft tissue swelling and joint effusion. Negative for fracture or osteomyelitis.   Electronically Signed   By: [name] Gallo M.D.   On: 12/18/2013 11:31   Mr Tibia Fibula Left Wo Contrast  12/18/2013   CLINICAL DATA:  Weeping wounds along the lower leg and foot. Plantar ulceration.  EXAM: MRI OF LOWER LEFT EXTREMITY WITHOUT CONTRAST  TECHNIQUE: Multiplanar, multisequence MR imaging of the left tibia/ fibula was performed. No intravenous contrast was administered.  COMPARISON:  12/18/2013  FINDINGS: The distal margins of bilateral knee implants are noted.  Speckled confluent distal tibial marrow edema signal noted with some marginal low T2 signal but not reflected with low T1 signal. There is some faint linear and curvilinear increased T2 signal in the proximal tibial diaphysis on the left. I do not see an obvious a draining sinus tract or sequestrum.  There is diffuse subcutaneous edema in the left calf circumferentially, clearly asymmetric to the right side. High T1 signal in the left soleus and left gluteus medius muscles, favor fatty atrophy over hematoma given the streaky appearance.  No gas or obvious abscess in the soft tissues.  IMPRESSION: 1. Abnormal confluent edema  signal in the distal could left tibial shaft, which terminates somewhat abruptly in the distal shaft. On conventional radiographs, there is some very faint sclerosis along this termination by without characteristic findings of bone infarct or enchondroma. I favor this is being due to a chronic osteomyelitis of the tibial shaft. 2. Diffuse asymmetric subcutaneous edema in the left calf, quite possibly representing cellulitis. 3. Asymmetric fatty atrophy of the left soleus and medial head left gastrocnemius muscles.   Electronically Signed   By: Sherryl Barters M.D.   On: 12/18/2013 17:40   Mr Foot Left Wo Contrast  12/18/2013   CLINICAL DATA:  Diabetes. Foot ulcer. Worsening lower extremity pain and swelling. Cellulitis  EXAM: MRI OF THE LEFT FOREFOOT WITHOUT CONTRAST  TECHNIQUE: Multiplanar, multisequence MR imaging was performed. No intravenous contrast was administered.  COMPARISON:  None.  FINDINGS: On some of are images, we performed are osteomyelitis protocol to include the whole foot rather than just before foot. On other sequences, only the forefoot was included.  And metal screw observed at the first tarsometatarsal articulation. Prominent cutaneous and subcutaneous edema along the ankle and dorsal forefoot.  No significant abnormal osseous edema characteristic of osteomyelitis identified. Lisfranc joint alignment appears normal; the Lisfranc ligament, if intact, is obscured by the metal artifact from the adjacent first tarsometatarsal joint screw.  I do not observe definite gas in the soft tissues. There may be some prominent blistering in the medial ankle, correlate with visual inspection.  IMPRESSION: 1. Extensive cutaneous and subcutaneous edema in the ankle and foot without osteomyelitis or drainable abscess identified. may be some blistering along the medial ankle. Please note that IV contrast was not administered, which can adversely affect assessment for small or abscesses. 2. Screw fixator at  the first tarsometatarsal articulation.   Electronically Signed   By: Sherryl Barters M.D.   On: 12/18/2013 17:33   Dg Foot Complete Left  12/18/2013   CLINICAL DATA:  Diabetic ulcer of foot, wound cellulitis  EXAM: LEFT FOOT - COMPLETE 3+ VIEW  COMPARISON:  10/04/2013  FINDINGS: Prior fusion of  first TMT joint.  Diffuse osseous demineralization.  Scattered soft tissue swelling.  Remaining joint spaces preserved.  No acute fracture, dislocation, or bone destruction.  Specifically, no soft tissue gas or radiographic evidence of osteomyelitis identified.  IMPRESSION: Osseous demineralization with diffuse soft tissue swelling LEFT foot.  Post first TMT joint fusion.  No acute osseous abnormalities.   Electronically Signed   By: Lavonia Dana M.D.   On: 12/18/2013 11:32    Review of Systems  All other systems reviewed and are negative.  Blood pressure 94/50, pulse 82, temperature 98.4 F (36.9 C), temperature source Oral, resp. rate 17, SpO2 95 %. Physical Exam On examination patient's leg and thigh are less tender to palpation. There is less swelling. Examination of the thigh and leg there is no crepitation with palpation note clinical signs of necrotizing fasciitis. Patient has massive blistering involving the left foot and left leg. Review of the MRI scan shows no deep abscess shows no subcutaneous air. The MRI scan is suggestive of chronic osteomyelitis of the distal tibia. 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  Plan: I do not see any indication for surgical intervention at this time. Patient will need aggressive antibiotic therapy and recommend infectious disease consultation for evaluation. In my experience I have not seen this type of massive cellulitis due to chronic osteomyelitis.  The tibia shows no periosteal reaction and no cloaca.  DUDA,MARCUS V 12/19/2013, 6:42 AM

## 2013-12-19 NOTE — Consult Note (Signed)
[name] for Infectious Disease  Date of Admission:  12/18/2013  Date of Consult:  12/19/2013  Reason for Consult: Cellulitis Referring Physician: Cruzita Lederer  Impression/Recommendation Cellulitis, severe  Chronic Osteo L tibial shaft  Use diabetic foot order set  check HIV  Comment- Very severe leg infection that will take some time to heal due to tissue loss.  He will need longer term anbx due to osteo.   Thank you so much for this interesting consult,   [name] (pager) [phone] [email or web address]  [name] is an 48 y.o. male.  HPI: 47 yo M with newly dx DM2 (pre-DM for 4 years) and chronic L foot ulcer (x 4 years) as well as ETOH abuse. He had a foot ulcer on L ~ 4 years ago, he underwent screw placement and it eventually healed. It eventually returned.  He was seen ~2 weeks ago at [facility] for LLE cellulitis and d/c home with po clinda. Since coming home has had worsening erythema and swelling (up to his thigh) as well as weeping from his wounds. H had temp 100.4 at home and was seen in ortho office, sent to ED.  He was started on ceftriaxone/flagyl, plain films negative for osteo. He was then changed to vanco/cefepime. He has since had MRI showing:  1. Extensive cutaneous and subcutaneous edema in the ankle and foot without osteomyelitis or drainable abscess identified. may be some blistering along the medial ankle. Please note that IV contrast was not administered, which can adversely affect assessment for small or abscesses. 2. Screw fixator at the first tarsometatarsal articulation. 1. Abnormal confluent edema signal in the distal could left tibial shaft, which terminates somewhat abruptly in the distal shaft. On conventional radiographs, there is some very faint sclerosis along this termination by without characteristic findings of bone infarct or enchondroma. I favor this is being due to a chronic osteomyelitis of the tibial shaft. 2. Diffuse  asymmetric subcutaneous edema in the left calf, quite possibly representing cellulitis. 3. Asymmetric fatty atrophy of the left soleus and medial head left gastrocnemius muscles.  Past Medical History  Diagnosis Date  . Diabetic foot ulcer   . ETOH abuse   . Anxiety   . Open wound     bottom of foot  . Diabetes mellitus without complication     borderline  . Neuromuscular disorder     neuropathy  . GERD (gastroesophageal reflux disease)     tums  . Pneumonia ~ 2012  . History of blood transfusion     "related to left knee OR; probably right hip too" (04/21/2012)  . Stroke 2008    "they said I might have had one during right hip replacement" (04/21/2012)  . Arthritis     "everywhere" (04/21/2012)  . Mental disorder   . Depression   . DDD (degenerative disc disease)     Past Surgical History  Procedure Laterality Date  . Total hip arthroplasty Right 2008  . Total knee arthroplasty Left 2006  . Joint replacement    . Lung lobectomy Left ~ 2006  . Revision total hip arthroplasty Right 2008    "4-5 months after replacement" (04/21/2012)  . Knee arthroscopy Bilateral 1980's/1990's  . Lung lobectomy    . Metatarsal osteotomy  10/29/2011    Procedure: METATARSAL OSTEOTOMY;  Surgeon: [name], MD;  Location: [place];  Service: Orthopedics;  Laterality: Left;  Left 1st Metatarsal Dorsal Closing Wedge   . Total knee arthroplasty Right 04/20/2012  . Total  knee arthroplasty Right 04/20/2012    Procedure: TOTAL KNEE ARTHROPLASTY;  Surgeon: [name], MD;  Location: [place];  Service: Orthopedics;  Laterality: Right;  Right Total Knee Arthroplasty     Allergies  Allergen Reactions  . Benadryl [Diphenhydramine Hcl] Other (See Comments)    Pt states leg spasm  . Trazodone And Nefazodone Other (See Comments)    Leg Spasms.     Medications:  Scheduled: . ceFEPime (MAXIPIME) IV  2 g Intravenous Q12H  . escitalopram  20 mg Oral q morning - 01V  . folic acid  1 mg Oral Daily  .  gabapentin  400 mg Oral QID  . heparin  5,000 Units Subcutaneous 3 times per day  . insulin aspart  0-5 Units Subcutaneous QHS  . living well with diabetes book   Does not apply Once  . methocarbamol  500 mg Oral TID  . multivitamin with minerals  1 tablet Oral Daily  . pantoprazole  40 mg Oral Daily  . QUEtiapine  200 mg Oral QHS  . sodium chloride  3 mL Intravenous Q12H  . thiamine  100 mg Oral Daily   Or  . thiamine  100 mg Intravenous Daily  . vancomycin  1,000 mg Intravenous Q8H    Abtx:  Anti-infectives    Start     Dose/Rate Route Frequency Ordered Stop   12/19/13 0100  vancomycin (VANCOCIN) IVPB 1000 mg/200 mL premix     1,000 mg200 mL/hr over 60 Minutes Intravenous Every 8 hours 12/18/13 1509     12/18/13 1545  vancomycin (VANCOCIN) IVPB 1000 mg/200 mL premix     1,000 mg200 mL/hr over 60 Minutes Intravenous  Once 12/18/13 1509 12/18/13 1944   12/18/13 1530  ceFEPIme (MAXIPIME) 2 g in dextrose 5 % 50 mL IVPB     2 g100 mL/hr over 30 Minutes Intravenous Every 12 hours 12/18/13 1421     12/18/13 1400  metroNIDAZOLE (FLAGYL) tablet 500 mg  Status:  Discontinued     500 mg Oral 3 times per day 12/18/13 1224 12/18/13 1421   12/18/13 1230  cefTRIAXone (ROCEPHIN) 2 g in dextrose 5 % 50 mL IVPB  Status:  Discontinued     2 g100 mL/hr over 30 Minutes Intravenous Every 24 hours 12/18/13 1224 12/18/13 1421      Total days of antibiotics: 2 vanco/cefepime          Social History:  reports that he has been smoking Cigarettes.  He has a 30 pack-year smoking history. He has never used smokeless tobacco. He reports that he uses illicit drugs (Marijuana and Cocaine). He reports that he does not drink alcohol.  Family History  Problem Relation Age of Onset  . Diabetes Father     General ROS: normal BM, normal urination, no SOB, no headaches. see HPI.   Blood pressure 132/73, pulse 84, temperature 98.5 F (36.9 C), temperature source Oral, resp. rate 18, SpO2 100 %. General  appearance: alert, cooperative and no distress Eyes: negative findings: pupils equal, round, reactive to light and accomodation Throat: normal findings: oropharynx pink & moist without lesions or evidence of thrush and abnormal findings: dentition: poor Neck: no adenopathy and supple, symmetrical, trachea midline Lungs: clear to auscultation bilaterally Heart: regular rate and rhythm Abdomen: normal findings: bowel sounds normal and soft, non-tender Extremities: erythema, swelling, blistering, desquamation of L left to thigh. ulcer on L 1st metatarsal head- bone not visible. wounds tender. hot.    Results for orders placed  or performed during the hospital encounter of 12/18/13 (from the past 48 hour(s))  Sedimentation rate     Status: Abnormal   Collection Time: 12/18/13 10:30 AM  Result Value Ref Range   Sed Rate 104 (H) 0 - 16 mm/hr  C-reactive protein     Status: Abnormal   Collection Time: 12/18/13 10:30 AM  Result Value Ref Range   CRP 14.9 (H) <0.60 mg/dL    Comment: Performed at [facility]  CBC with Differential     Status: Abnormal   Collection Time: 12/18/13 10:30 AM  Result Value Ref Range   WBC 13.3 (H) 4.0 - 10.5 K/uL   RBC 4.22 4.22 - 5.81 MIL/uL   Hemoglobin 10.4 (L) 13.0 - 17.0 g/dL   HCT 32.4 (L) 39.0 - 52.0 %   MCV 76.8 (L) 78.0 - 100.0 fL   MCH 24.6 (L) 26.0 - 34.0 pg   MCHC 32.1 30.0 - 36.0 g/dL   RDW 18.3 (H) 11.5 - 15.5 %   Platelets 462 (H) 150 - 400 K/uL   Neutrophils Relative % 74 43 - 77 %   Neutro Abs 10.0 (H) 1.7 - 7.7 K/uL   Lymphocytes Relative 16 12 - 46 %   Lymphs Abs 2.1 0.7 - 4.0 K/uL   Monocytes Relative 9 3 - 12 %   Monocytes Absolute 1.2 (H) 0.1 - 1.0 K/uL   Eosinophils Relative 1 0 - 5 %   Eosinophils Absolute 0.1 0.0 - 0.7 K/uL   Basophils Relative 0 0 - 1 %   Basophils Absolute 0.0 0.0 - 0.1 K/uL  Basic metabolic panel     Status: Abnormal   Collection Time: 12/18/13 10:30 AM  Result Value Ref Range   Sodium 133 (L) 137 -  147 mEq/L   Potassium 4.1 3.7 - 5.3 mEq/L   Chloride 95 (L) 96 - 112 mEq/L   CO2 26 19 - 32 mEq/L   Glucose, Bld 97 70 - 99 mg/dL   BUN 5 (L) 6 - 23 mg/dL   Creatinine, Ser 1.07 0.50 - 1.35 mg/dL   Calcium 9.0 8.4 - 10.5 mg/dL   GFR calc non Af Amer 79 (L) >90 mL/min   GFR calc Af Amer >90 >90 mL/min    Comment: (NOTE) The eGFR has been calculated using the CKD EPI equation. This calculation has not been validated in all clinical situations. eGFR's persistently <90 mL/min signify possible Chronic Kidney Disease.    Anion gap 12 5 - 15  Blood Cultures x 2 sites     Status: None (Preliminary result)   Collection Time: 12/18/13  1:05 PM  Result Value Ref Range   Specimen Description BLOOD LEFT ANTECUBITAL    Special Requests BOTTLES DRAWN AEROBIC AND ANAEROBIC 5CC    Culture  Setup Time      12/18/2013 19:20 Performed at [facility]    Culture             BLOOD CULTURE RECEIVED NO GROWTH TO DATE CULTURE WILL BE HELD FOR 5 DAYS BEFORE ISSUING A FINAL NEGATIVE REPORT Performed at [facility]    Report Status PENDING   Blood Cultures x 2 sites     Status: None (Preliminary result)   Collection Time: 12/18/13  1:05 PM  Result Value Ref Range   Specimen Description BLOOD RIGHT HAND    Special Requests BOTTLES DRAWN AEROBIC AND ANAEROBIC 5CC    Culture  Setup Time      12/18/2013  19:20 Performed at [place] NO GROWTH TO DATE CULTURE WILL BE HELD FOR 5 DAYS BEFORE ISSUING A FINAL NEGATIVE REPORT Performed at [facility]    Report Status PENDING   Urinalysis, Routine w reflex microscopic     Status: Abnormal   Collection Time: 12/18/13  2:02 PM  Result Value Ref Range   Color, Urine AMBER (A) YELLOW    Comment: BIOCHEMICALS MAY BE AFFECTED BY COLOR   APPearance CLOUDY (A) CLEAR   Specific Gravity, Urine 1.017 1.005 - 1.030   pH 5.5 5.0 - 8.0   Glucose, UA 100 (A) NEGATIVE mg/dL   Hgb urine  dipstick NEGATIVE NEGATIVE   Bilirubin Urine SMALL (A) NEGATIVE   Ketones, ur NEGATIVE NEGATIVE mg/dL   Protein, ur NEGATIVE NEGATIVE mg/dL   Urobilinogen, UA 0.2 0.0 - 1.0 mg/dL   Nitrite NEGATIVE NEGATIVE   Leukocytes, UA NEGATIVE NEGATIVE    Comment: MICROSCOPIC NOT DONE ON URINES WITH NEGATIVE PROTEIN, BLOOD, LEUKOCYTES, NITRITE, OR GLUCOSE <1000 mg/dL.  Glucose, capillary     Status: None   Collection Time: 12/18/13  5:14 PM  Result Value Ref Range   Glucose-Capillary 94 70 - 99 mg/dL  CBC     Status: Abnormal   Collection Time: 12/18/13  5:26 PM  Result Value Ref Range   WBC 10.5 4.0 - 10.5 K/uL   RBC 4.03 (L) 4.22 - 5.81 MIL/uL   Hemoglobin 9.9 (L) 13.0 - 17.0 g/dL   HCT 31.0 (L) 39.0 - 52.0 %   MCV 76.9 (L) 78.0 - 100.0 fL   MCH 24.6 (L) 26.0 - 34.0 pg   MCHC 31.9 30.0 - 36.0 g/dL   RDW 18.4 (H) 11.5 - 15.5 %   Platelets 440 (H) 150 - 400 K/uL  Creatinine, serum     Status: None   Collection Time: 12/18/13  5:26 PM  Result Value Ref Range   Creatinine, Ser 0.84 0.50 - 1.35 mg/dL   GFR calc non Af Amer >90 >90 mL/min   GFR calc Af Amer >90 >90 mL/min    Comment: (NOTE) The eGFR has been calculated using the CKD EPI equation. This calculation has not been validated in all clinical situations. eGFR's persistently <90 mL/min signify possible Chronic Kidney Disease.   Hemoglobin A1c     Status: Abnormal   Collection Time: 12/18/13  5:26 PM  Result Value Ref Range   Hgb A1c MFr Bld 7.7 (H) <5.7 %    Comment: (NOTE)                                                                       According to the ADA Clinical Practice Recommendations for 2011, when HbA1c is used as a screening test:  >=6.5%   Diagnostic of Diabetes Mellitus           (if abnormal result is confirmed) 5.7-6.4%   Increased risk of developing Diabetes Mellitus References:Diagnosis and Classification of Diabetes Mellitus,Diabetes NOBS,9628,36(OQHUT 1):S62-S69 and Standards of Medical Care in           Diabetes - 2011,Diabetes MLYY,5035,46 (Suppl 1):S11-S61.    Mean Plasma  Glucose 174 (H) <117 mg/dL    Comment: Performed at [facility]  Glucose, capillary     Status: Abnormal   Collection Time: 12/18/13 10:03 PM  Result Value Ref Range   Glucose-Capillary 117 (H) 70 - 99 mg/dL  CBC     Status: Abnormal   Collection Time: 12/19/13  4:16 AM  Result Value Ref Range   WBC 7.5 4.0 - 10.5 K/uL   RBC 3.88 (L) 4.22 - 5.81 MIL/uL   Hemoglobin 9.5 (L) 13.0 - 17.0 g/dL   HCT 30.0 (L) 39.0 - 52.0 %   MCV 77.3 (L) 78.0 - 100.0 fL   MCH 24.5 (L) 26.0 - 34.0 pg   MCHC 31.7 30.0 - 36.0 g/dL   RDW 18.4 (H) 11.5 - 15.5 %   Platelets 393 150 - 400 K/uL  Comprehensive metabolic panel     Status: Abnormal   Collection Time: 12/19/13  4:16 AM  Result Value Ref Range   Sodium 136 (L) 137 - 147 mEq/L   Potassium 3.9 3.7 - 5.3 mEq/L   Chloride 101 96 - 112 mEq/L   CO2 26 19 - 32 mEq/L   Glucose, Bld 101 (H) 70 - 99 mg/dL   BUN 6 6 - 23 mg/dL   Creatinine, Ser 0.88 0.50 - 1.35 mg/dL   Calcium 8.2 (L) 8.4 - 10.5 mg/dL   Total Protein 7.0 6.0 - 8.3 g/dL   Albumin 1.8 (L) 3.5 - 5.2 g/dL   AST 41 (H) 0 - 37 U/L   ALT 35 0 - 53 U/L   Alkaline Phosphatase 87 39 - 117 U/L   Total Bilirubin 0.3 0.3 - 1.2 mg/dL   GFR calc non Af Amer >90 >90 mL/min   GFR calc Af Amer >90 >90 mL/min    Comment: (NOTE) The eGFR has been calculated using the CKD EPI equation. This calculation has not been validated in all clinical situations. eGFR's persistently <90 mL/min signify possible Chronic Kidney Disease.    Anion gap 9 5 - 15  Glucose, capillary     Status: None   Collection Time: 12/19/13  7:34 AM  Result Value Ref Range   Glucose-Capillary 91 70 - 99 mg/dL  Glucose, capillary     Status: Abnormal   Collection Time: 12/19/13 12:07 PM  Result Value Ref Range   Glucose-Capillary 134 (H) 70 - 99 mg/dL      Component Value Date/Time   SDES BLOOD LEFT ANTECUBITAL 12/18/2013 1305   SDES BLOOD  RIGHT HAND 12/18/2013 1305   SPECREQUEST BOTTLES DRAWN AEROBIC AND ANAEROBIC 5CC 12/18/2013 1305   SPECREQUEST BOTTLES DRAWN AEROBIC AND ANAEROBIC 5CC 12/18/2013 1305   CULT  12/18/2013 1305           BLOOD CULTURE RECEIVED NO GROWTH TO DATE CULTURE WILL BE HELD FOR 5 DAYS BEFORE ISSUING A FINAL NEGATIVE REPORT Performed at [place]  12/18/2013 1305           BLOOD CULTURE RECEIVED NO GROWTH TO DATE CULTURE WILL BE HELD FOR 5 DAYS BEFORE ISSUING A FINAL NEGATIVE REPORT Performed at [place] PENDING 12/18/2013 1305   REPTSTATUS PENDING 12/18/2013 1305   Dg Ankle Complete Left  12/18/2013   CLINICAL DATA:  Diabetic foot.  Cellulitis  EXAM: LEFT ANKLE COMPLETE - 3+ VIEW  COMPARISON:  06/30/2013  FINDINGS: Diffuse soft tissue swelling has progressed. Joint effusion is noted. Joint space is normal.  Negative  for fracture or osteomyelitis.  First metatarsal screw again noted.  IMPRESSION: Diffuse soft tissue swelling and joint effusion. Negative for fracture or osteomyelitis.   Electronically Signed   By: [name] Gallo M.D.   On: 12/18/2013 11:31   Mr Tibia Fibula Left Wo Contrast  12/19/2013   ADDENDUM REPORT: 12/19/2013 15:16  ADDENDUM: The original report was by Dr. Van Clines. The following addendum is by Dr. Van Clines:  In the first sentence under impressions, the word "could" should be stricken.   Electronically Signed   By: Sherryl Barters M.D.   On: 12/19/2013 15:16   12/19/2013   CLINICAL DATA:  Weeping wounds along the lower leg and foot. Plantar ulceration.  EXAM: MRI OF LOWER LEFT EXTREMITY WITHOUT CONTRAST  TECHNIQUE: Multiplanar, multisequence MR imaging of the left tibia/ fibula was performed. No intravenous contrast was administered.  COMPARISON:  12/18/2013  FINDINGS: The distal margins of bilateral knee implants are noted.  Speckled confluent distal tibial marrow edema signal noted with some marginal low T2 signal but not  reflected with low T1 signal. There is some faint linear and curvilinear increased T2 signal in the proximal tibial diaphysis on the left. I do not see an obvious a draining sinus tract or sequestrum.  There is diffuse subcutaneous edema in the left calf circumferentially, clearly asymmetric to the right side. High T1 signal in the left soleus and left gluteus medius muscles, favor fatty atrophy over hematoma given the streaky appearance.  No gas or obvious abscess in the soft tissues.  IMPRESSION: 1. Abnormal confluent edema signal in the distal could left tibial shaft, which terminates somewhat abruptly in the distal shaft. On conventional radiographs, there is some very faint sclerosis along this termination by without characteristic findings of bone infarct or enchondroma. I favor this is being due to a chronic osteomyelitis of the tibial shaft. 2. Diffuse asymmetric subcutaneous edema in the left calf, quite possibly representing cellulitis. 3. Asymmetric fatty atrophy of the left soleus and medial head left gastrocnemius muscles.  Electronically Signed: By: Sherryl Barters M.D. On: 12/18/2013 17:40   Mr Foot Left Wo Contrast  12/18/2013   CLINICAL DATA:  Diabetes. Foot ulcer. Worsening lower extremity pain and swelling. Cellulitis  EXAM: MRI OF THE LEFT FOREFOOT WITHOUT CONTRAST  TECHNIQUE: Multiplanar, multisequence MR imaging was performed. No intravenous contrast was administered.  COMPARISON:  None.  FINDINGS: On some of are images, we performed are osteomyelitis protocol to include the whole foot rather than just before foot. On other sequences, only the forefoot was included.  And metal screw observed at the first tarsometatarsal articulation. Prominent cutaneous and subcutaneous edema along the ankle and dorsal forefoot.  No significant abnormal osseous edema characteristic of osteomyelitis identified. Lisfranc joint alignment appears normal; the Lisfranc ligament, if intact, is obscured by the  metal artifact from the adjacent first tarsometatarsal joint screw.  I do not observe definite gas in the soft tissues. There may be some prominent blistering in the medial ankle, correlate with visual inspection.  IMPRESSION: 1. Extensive cutaneous and subcutaneous edema in the ankle and foot without osteomyelitis or drainable abscess identified. may be some blistering along the medial ankle. Please note that IV contrast was not administered, which can adversely affect assessment for small or abscesses. 2. Screw fixator at the first tarsometatarsal articulation.   Electronically Signed   By: Sherryl Barters M.D.   On: 12/18/2013 17:33   Dg Foot Complete Left  12/18/2013   CLINICAL DATA:  Diabetic ulcer of foot, wound cellulitis  EXAM: LEFT FOOT - COMPLETE 3+ VIEW  COMPARISON:  10/04/2013  FINDINGS: Prior fusion of first TMT joint.  Diffuse osseous demineralization.  Scattered soft tissue swelling.  Remaining joint spaces preserved.  No acute fracture, dislocation, or bone destruction.  Specifically, no soft tissue gas or radiographic evidence of osteomyelitis identified.  IMPRESSION: Osseous demineralization with diffuse soft tissue swelling LEFT foot.  Post first TMT joint fusion.  No acute osseous abnormalities.   Electronically Signed   By: Lavonia Dana M.D.   On: 12/18/2013 11:32   Recent Results (from the past 240 hour(s))  Blood Cultures x 2 sites     Status: None (Preliminary result)   Collection Time: 12/18/13  1:05 PM  Result Value Ref Range Status   Specimen Description BLOOD LEFT ANTECUBITAL  Final   Special Requests BOTTLES DRAWN AEROBIC AND ANAEROBIC 5CC  Final   Culture  Setup Time   Final    12/18/2013 19:20 Performed at [facility]    Culture   Final           BLOOD CULTURE RECEIVED NO GROWTH TO DATE CULTURE WILL BE HELD FOR 5 DAYS BEFORE ISSUING A FINAL NEGATIVE REPORT Performed at [facility]    Report Status PENDING  Incomplete  Blood Cultures x 2 sites      Status: None (Preliminary result)   Collection Time: 12/18/13  1:05 PM  Result Value Ref Range Status   Specimen Description BLOOD RIGHT HAND  Final   Special Requests BOTTLES DRAWN AEROBIC AND ANAEROBIC 5CC  Final   Culture  Setup Time   Final    12/18/2013 19:20 Performed at [facility]    Culture   Final           BLOOD CULTURE RECEIVED NO GROWTH TO DATE CULTURE WILL BE HELD FOR 5 DAYS BEFORE ISSUING A FINAL NEGATIVE REPORT Performed at [facility]    Report Status PENDING  Incomplete      12/19/2013, 3:23 PM     LOS: 1 day

## 2013-12-19 NOTE — Progress Notes (Signed)
PROGRESS NOTE  Rivan Siordia UDJ:[id number] DOB: 13-Aug-1965 DOA: 12/18/2013 PCP: No PCP Per Patient  HPI: 48 yo male with PMH of diabetes mellitus with chronic diabetic foot ulcer in the plantar region of left foot, diabetic neuropathy, hx of EtOH abuse, who presents to the ED with complaints of worsening left lower extremity pain and swelling. Recently hospitalized for 1 week for left lower extremity cellulitis and discharged 1 week ago on clindamycin.  Compliant with medication.  Since discharge he reports sever pain in his left lower extremity with increased swelling, erythema, and weeping from wounds.  Seen by wound care at home.  Reports fever and chills with temperature of 100.4 last week.  Seen in office by Dr. Sharol Given who advised him to return to the hospital for admission.  Denies nausea, vomiting, bloody stools, chest pain, shortness of breath, abdominal pain, dysuria, hematuria.  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        Subjective: Denies any fever or chills overnight.  Reports pain and asking for pain medication.  Dr. Sharol Given notes less swelling and tenderness of leg today compared to yesterday when seen in office.    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   - Hospitalized approximately 2 weeks ago at [facility] for cellulitus of left lower extremity treated with IV abx for 7 days then followed by outpatient clindamycin. Improved while on IV antibiotics but got worse home while on oral agents.  - reports fever and chills at home prior to this hospital admission.  - MRI as below with chronic osteo, elevated inflammatory markers - consulted ID - appreciate Dr. Jess Barters input, no need for surgicial intervention at this time - continue IV Cefepime and Vancomycin for now,  improving today   Foot ulcer, left: Chronic ulcer.  Likely due to uncontrolled diabetes and diabetic neuropathy.  MRI of foot shows no osteomyelitis or drainable abscess.  Continue would care.        Diabetes Mellitus, Type 2:  - new diagnosis for pt.  Currently not on medication as outpatient.   - A1C 7.7.  Will need diabetes education.  - Will start on Metformin when discharged.   - Will have pt f/u with PCP for long term management at discharge.  Alcohol dependence: Currently no signs of withdrawal.   Generalized Anxiety Disorder: Will continue Klonopin, Lexapro, and Seroquel.        Anemia - likely of chronic disease, no evidence of bleeding;  - check iron studies  DVT Prophylaxis:  Heparin 5,000 units q8h Diet: Carb modified   Code Status: Full Family Communication: Pt is awake and alert Disposition Plan: pending PT evaluation  Consultants:  Orthopedic Surgery  Infectious Disease  Procedures:  None  Antibiotics:  IV Cefepime and Vancomycin 11/30>>  Objective: Filed Vitals:   12/18/13 1330 12/18/13 1425 12/18/13 2200 12/19/13 0559  BP: 132/75 131/70 126/66 94/50  Pulse: 89 90 89 82  Temp:  99.1 F (37.3 C) 98.8 F (37.1 C) 98.4 F (36.9 C)  TempSrc:  Oral Oral Oral  Resp:  18 18 17   SpO2: 97% 98% 98% 95%    Intake/Output Summary (Last 24 hours) at 12/19/13 1019 Last data filed at 12/19/13 0953  Gross per 24 hour  Intake   1995 ml  Output   1475 ml  Net    520 ml   Exam: General: Overweight, NAD, appears stated age  HEENT:  Anicteic Sclera, MMM.  Cardiovascular: RRR, S1 S2 auscultated, no rubs, murmurs or gallops.   Respiratory: Clear to auscultation bilaterally with equal chest rise  Abdomen: Soft, nontender, nondistended, + bowel sounds  Extremities: Left lower extremity- erythema, streaking, swelling, tenderness to palpation, blistering of medial aspect of left lower leg and foot. Right lower extremity- warm dry without cyanosis clubbing or  edema.  Neuro: AAOx3 Psych: Normal affect and demeanor with intact judgement and insight   Data Reviewed: Basic Metabolic Panel:  Recent Labs Lab 12-29-2013 1030 29-Dec-2013 1726 12/19/13 0416  NA 133*  --  136*  K 4.1  --  3.9  CL 95*  --  101  CO2 26  --  26  GLUCOSE 97  --  101*  BUN 5*  --  6  CREATININE 1.07 0.84 0.88  CALCIUM 9.0  --  8.2*   Liver Function Tests:  Recent Labs Lab 12/19/13 0416  AST 41*  ALT 35  ALKPHOS 87  BILITOT 0.3  PROT 7.0  ALBUMIN 1.8*   CBC:  Recent Labs Lab Dec 29, 2013 1030 Dec 29, 2013 1726 12/19/13 0416  WBC 13.3* 10.5 7.5  NEUTROABS 10.0*  --   --   HGB 10.4* 9.9* 9.5*  HCT 32.4* 31.0* 30.0*  MCV 76.8* 76.9* 77.3*  PLT 462* 440* 393   CBG:  Recent Labs Lab 12-29-13 1714 12/29/2013 2203 12/19/13 0734  GLUCAP 94 117* 91    Recent Results (from the past 240 hour(s))  Blood Cultures x 2 sites     Status: None (Preliminary result)   Collection Time: 29-Dec-2013  1:05 PM  Result Value Ref Range Status   Specimen Description BLOOD LEFT ANTECUBITAL  Final   Special Requests BOTTLES DRAWN AEROBIC AND ANAEROBIC 5CC  Final   Culture  Setup Time   Final    2013-12-29 19:20 Performed at [facility]    Culture   Final           BLOOD CULTURE RECEIVED NO GROWTH TO DATE CULTURE WILL BE HELD FOR 5 DAYS BEFORE ISSUING A FINAL NEGATIVE REPORT Performed at [facility]    Report Status PENDING  Incomplete  Blood Cultures x 2 sites     Status: None (Preliminary result)   Collection Time: 12/29/2013  1:05 PM  Result Value Ref Range Status   Specimen Description BLOOD RIGHT HAND  Final   Special Requests BOTTLES DRAWN AEROBIC AND ANAEROBIC 5CC  Final   Culture  Setup Time   Final    2013/12/29 19:20 Performed at [facility]    Culture   Final           BLOOD CULTURE RECEIVED NO GROWTH TO DATE CULTURE WILL BE HELD FOR 5 DAYS BEFORE ISSUING A FINAL NEGATIVE REPORT Performed at [facility]    Report Status  PENDING  Incomplete     Studies: Dg Ankle Complete Left  12-29-2013   CLINICAL DATA:  Diabetic foot.  Cellulitis  EXAM: LEFT ANKLE COMPLETE - 3+ VIEW  COMPARISON:  06/30/2013  FINDINGS: Diffuse soft tissue swelling has progressed. Joint effusion is noted. Joint space is normal.  Negative for fracture or osteomyelitis.  First metatarsal screw again noted.  IMPRESSION: Diffuse soft tissue swelling and joint effusion. Negative for fracture or osteomyelitis.   Electronically Signed   By: [name] Gallo M.D.   On: December 29, 2013 11:31  Mr Tibia Fibula Left Wo Contrast  12/18/2013   CLINICAL DATA:  Weeping wounds along the lower leg and foot. Plantar ulceration.  EXAM: MRI OF LOWER LEFT EXTREMITY WITHOUT CONTRAST  TECHNIQUE: Multiplanar, multisequence MR imaging of the left tibia/ fibula was performed. No intravenous contrast was administered.  COMPARISON:  12/18/2013  FINDINGS: The distal margins of bilateral knee implants are noted.  Speckled confluent distal tibial marrow edema signal noted with some marginal low T2 signal but not reflected with low T1 signal. There is some faint linear and curvilinear increased T2 signal in the proximal tibial diaphysis on the left. I do not see an obvious a draining sinus tract or sequestrum.  There is diffuse subcutaneous edema in the left calf circumferentially, clearly asymmetric to the right side. High T1 signal in the left soleus and left gluteus medius muscles, favor fatty atrophy over hematoma given the streaky appearance.  No gas or obvious abscess in the soft tissues.  IMPRESSION: 1. Abnormal confluent edema signal in the distal could left tibial shaft, which terminates somewhat abruptly in the distal shaft. On conventional radiographs, there is some very faint sclerosis along this termination by without characteristic findings of bone infarct or enchondroma. I favor this is being due to a chronic osteomyelitis of the tibial shaft. 2. Diffuse asymmetric subcutaneous  edema in the left calf, quite possibly representing cellulitis. 3. Asymmetric fatty atrophy of the left soleus and medial head left gastrocnemius muscles.   Electronically Signed   By: Sherryl Barters M.D.   On: 12/18/2013 17:40   Mr Foot Left Wo Contrast  12/18/2013   CLINICAL DATA:  Diabetes. Foot ulcer. Worsening lower extremity pain and swelling. Cellulitis  EXAM: MRI OF THE LEFT FOREFOOT WITHOUT CONTRAST  TECHNIQUE: Multiplanar, multisequence MR imaging was performed. No intravenous contrast was administered.  COMPARISON:  None.  FINDINGS: On some of are images, we performed are osteomyelitis protocol to include the whole foot rather than just before foot. On other sequences, only the forefoot was included.  And metal screw observed at the first tarsometatarsal articulation. Prominent cutaneous and subcutaneous edema along the ankle and dorsal forefoot.  No significant abnormal osseous edema characteristic of osteomyelitis identified. Lisfranc joint alignment appears normal; the Lisfranc ligament, if intact, is obscured by the metal artifact from the adjacent first tarsometatarsal joint screw.  I do not observe definite gas in the soft tissues. There may be some prominent blistering in the medial ankle, correlate with visual inspection.  IMPRESSION: 1. Extensive cutaneous and subcutaneous edema in the ankle and foot without osteomyelitis or drainable abscess identified. may be some blistering along the medial ankle. Please note that IV contrast was not administered, which can adversely affect assessment for small or abscesses. 2. Screw fixator at the first tarsometatarsal articulation.   Electronically Signed   By: Sherryl Barters M.D.   On: 12/18/2013 17:33   Dg Foot Complete Left  12/18/2013   CLINICAL DATA:  Diabetic ulcer of foot, wound cellulitis  EXAM: LEFT FOOT - COMPLETE 3+ VIEW  COMPARISON:  10/04/2013  FINDINGS: Prior fusion of first TMT joint.  Diffuse osseous demineralization.  Scattered  soft tissue swelling.  Remaining joint spaces preserved.  No acute fracture, dislocation, or bone destruction.  Specifically, no soft tissue gas or radiographic evidence of osteomyelitis identified.  IMPRESSION: Osseous demineralization with diffuse soft tissue swelling LEFT foot.  Post first TMT joint fusion.  No acute osseous abnormalities.   Electronically Signed   By: Crist Infante.D.  On: 12/18/2013 11:32    Scheduled Meds: . ceFEPime (MAXIPIME) IV  2 g Intravenous Q12H  . escitalopram  20 mg Oral q morning - 50K  . folic acid  1 mg Oral Daily  . gabapentin  400 mg Oral QID  . heparin  5,000 Units Subcutaneous 3 times per day  . insulin aspart  0-5 Units Subcutaneous QHS  . living well with diabetes book   Does not apply Once  . methocarbamol  500 mg Oral TID  . multivitamin with minerals  1 tablet Oral Daily  . pantoprazole  40 mg Oral Daily  . QUEtiapine  200 mg Oral QHS  . sodium chloride  3 mL Intravenous Q12H  . thiamine  100 mg Oral Daily   Or  . thiamine  100 mg Intravenous Daily  . vancomycin  1,000 mg Intravenous Q8H   Continuous Infusions: . sodium chloride 75 mL/hr at 12/19/13 0400    Principal Problem:   Left leg cellulitis Active Problems:   Foot ulcer, left   Alcohol dependence   Generalized anxiety disorder   Type 2 diabetes mellitus with left diabetic foot ulcer   Diabetes mellitus due to underlying condition with foot ulcer   Adelene Idler PA-S  [facility] Pager [phone]. If 7PM-7AM, please contact night-coverage at [email or web address], password [facility] 12/19/2013, 10:19 AM  LOS: 1 day       Patient seen and examined, chart and data base reviewed.  I agree with the above assessment and plan and have edited the note   Marzetta Board, MD [facility] [phone]   Ludella Pranger M. Cruzita Lederer, MD [facility] [phone]

## 2013-12-20 DIAGNOSIS — M86662 Other chronic osteomyelitis, left tibia and fibula: Secondary | ICD-10-CM | POA: Insufficient documentation

## 2013-12-20 DIAGNOSIS — E11621 Type 2 diabetes mellitus with foot ulcer: Secondary | ICD-10-CM

## 2013-12-20 DIAGNOSIS — L97529 Non-pressure chronic ulcer of other part of left foot with unspecified severity: Secondary | ICD-10-CM

## 2013-12-20 LAB — HIV ANTIBODY (ROUTINE TESTING W REFLEX): HIV: NONREACTIVE

## 2013-12-20 LAB — CBC
HEMATOCRIT: 27.7 % — AB (ref 39.0–52.0)
Hemoglobin: 8.7 g/dL — ABNORMAL LOW (ref 13.0–17.0)
MCH: 24 pg — ABNORMAL LOW (ref 26.0–34.0)
MCHC: 31.4 g/dL (ref 30.0–36.0)
MCV: 76.5 fL — AB (ref 78.0–100.0)
Platelets: 382 10*3/uL (ref 150–400)
RBC: 3.62 MIL/uL — AB (ref 4.22–5.81)
RDW: 18.2 % — ABNORMAL HIGH (ref 11.5–15.5)
WBC: 6.8 10*3/uL (ref 4.0–10.5)

## 2013-12-20 LAB — GLUCOSE, CAPILLARY
GLUCOSE-CAPILLARY: 104 mg/dL — AB (ref 70–99)
GLUCOSE-CAPILLARY: 134 mg/dL — AB (ref 70–99)
Glucose-Capillary: 113 mg/dL — ABNORMAL HIGH (ref 70–99)
Glucose-Capillary: 126 mg/dL — ABNORMAL HIGH (ref 70–99)

## 2013-12-20 LAB — VANCOMYCIN, TROUGH: VANCOMYCIN TR: 20.8 ug/mL — AB (ref 10.0–20.0)

## 2013-12-20 MED ORDER — MUPIROCIN CALCIUM 2 % EX CREA
TOPICAL_CREAM | Freq: Every day | CUTANEOUS | Status: DC
  Administered 2013-12-22: 10:00:00 via TOPICAL
  Filled 2013-12-20: qty 15

## 2013-12-20 MED ORDER — FERROUS SULFATE 325 (65 FE) MG PO TABS
325.0000 mg | ORAL_TABLET | Freq: Two times a day (BID) | ORAL | Status: DC
Start: 2013-12-20 — End: 2013-12-22
  Administered 2013-12-20 – 2013-12-22 (×4): 325 mg via ORAL
  Filled 2013-12-20 (×6): qty 1

## 2013-12-20 MED ORDER — RIFAMPIN 300 MG PO CAPS
300.0000 mg | ORAL_CAPSULE | Freq: Every day | ORAL | Status: DC
  Administered 2013-12-20 – 2013-12-22 (×3): 300 mg via ORAL
  Filled 2013-12-20 (×3): qty 1

## 2013-12-20 MED ORDER — DOCUSATE SODIUM 100 MG PO CAPS
100.0000 mg | ORAL_CAPSULE | Freq: Two times a day (BID) | ORAL | Status: DC
  Administered 2013-12-21: 100 mg via ORAL
  Filled 2013-12-20 (×4): qty 1

## 2013-12-20 MED ORDER — PRO-STAT SUGAR FREE PO LIQD
30.0000 mL | Freq: Two times a day (BID) | ORAL | Status: DC
  Administered 2013-12-21 – 2013-12-22 (×3): 30 mL via ORAL
  Filled 2013-12-20 (×5): qty 30

## 2013-12-20 MED ORDER — NICOTINE 14 MG/24HR TD PT24
14.0000 mg | MEDICATED_PATCH | Freq: Every day | TRANSDERMAL | Status: DC
  Administered 2013-12-20 – 2013-12-22 (×3): 14 mg via TRANSDERMAL
  Filled 2013-12-20 (×3): qty 1

## 2013-12-20 NOTE — Progress Notes (Signed)
VASCULAR LAB PRELIMINARY  PRELIMINARY  PRELIMINARY  PRELIMINARY  VASCULAR LAB PRELIMINARY  ARTERIAL  ABI completed:    RIGHT    LEFT    PRESSURE WAVEFORM  PRESSURE WAVEFORM  BRACHIAL 137  Triphasic BRACHIAL 136 Triphasic  DP 142 Triphasic DP 136 Triphasic  PT 150 Triphasic PT 118 Triphasic  GREAT TOE 140 NA GREAT TOE 100 NA    RIGHT LEFT  ABI / TBI 1.09 / 1.02 0.89 / 0.73   Right ABI indicates normal arterial flow on the right and a mild reducion on the left. Great toe pressures indicate adequate perfusion bilaterally for vascular healing.  [name], RVS 12/20/2013, 11:44 AM

## 2013-12-20 NOTE — Consult Note (Addendum)
Re-consult requested by primary team; refer to previous progress notes from 11/30. Wound type: Previous full thickness to left plantar foot unchanged from previous assessment; dry scabbed callous, no open wound or drainage. Left leg with yellow gelatinous nonviable tissue extending from ankle to upper calf. This is beginning to recede from lines which were previously marked. Drainage (amount, consistency, odor) Large amt yellow drainage from calf and foot areas Periwound: Some previous blistered areas have ruptured and peeled. Others remain with large amt yellow-fluid-filled gelatinous skin.  Full thickness skin loss in patchy areas where skin has fallen off, revealing red, moist wound bed. Dry peeling skin and generalized edema and erythremia extends to upper thigh and has been marked. Dressing procedure/placement/frequency: Xeroform to promote healing; this can be applied by the bedside nurse after ABI has been completed today with ABD pads and kerlex to absorb drainage at this time, then Ace wrap for light compression. Bactroban to foot wound to provide antimicrobial benefits and avoid cracking and re-opening.  Primary team states pt will have home health assistance for dressing changes after discharge.  He can resume follow-up with Dr Sharol Given after discharge.  Discussed plan of care with patient and encouraged to take a bath or shower without dressings when at home and roughly towel dry to assist with removal of loose skin before dressing is re-applied.  He verbalizes understanding. Please re-consult if further assistance is needed.  Thank-you,  Julien Girt MSN, [place], [place], [place], [place]

## 2013-12-20 NOTE — Progress Notes (Addendum)
PROGRESS NOTE  [name] YTK:[id number] DOB: 06-01-1965 DOA: 12/18/2013 PCP: No PCP Per Patient  HPI: 48 yo male with PMH of diabetes mellitus with chronic diabetic foot ulcer in the plantar region of left foot, diabetic neuropathy, hx of polysubstance abuse, who presents to the ED with complaints of worsening left lower extremity pain and swelling. Recently hospitalized for 1 week for left lower extremity cellulitis and discharged 1 week prior to admission on clindamycin.  Compliant with medication.  Since discharge he reports severe pain in his left lower extremity with increased swelling, erythema, and weeping from wounds.  Seen by wound care at home.  Reports fever and chills with temperature of 100.4 last week.  Seen in office by Dr. Sharol Given who advised him to return to the hospital for admission.  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        Subjective: Denies any fever or chills overnight.  Ambulating yesterday with some pain.  Less erythema and swelling.  Leg is still tender to palpation.     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 for cellulitus of left lower extremity treated with IV abx for 7 days then followed by outpatient clindamycin. Improved while on IV antibiotics but got worse at home while on oral agents.  - MRI as below with chronic osteo, elevated inflammatory markers  - Appreciate Dr. Jess Barters input, no need for surgicial intervention at this time - ID consulted and recommending longer term abx therapy for chronic osteomyelitis. - Continue IV Cefepime and Vancomycin for now, improving today  - Will place PICC line once pt is no longer febrile.   Foot ulcer, left:  - Chronic ulcer.  Likely due to uncontrolled  diabetes and diabetic neuropathy.  - MRI foot shows extensive cutaneous and subcutaneous edema without osteo.   - Wound care re-consulted.  Will need HHRN for wound care at home.        Diabetes Mellitus, Type 2:  - New diagnosis for pt.  Currently not on medication as outpatient.   - A1C 7.7.  Will need diabetes education. - Continue SSI.  Will start on Metformin when discharged.   - Will have pt f/u with PCP for long term management at discharge.  Alcohol dependence: Currently no signs of withdrawal.   Generalized Anxiety Disorder: Continue Klonopin, Lexapro, and Seroquel.        Iron Deficiency Anemia: - Fe <10, TIBC not calculated due to low Iron level, Ferritin 284  - Will start PO Iron supplementation 12/2 with colace.  Tobacco Use: -Reports 1.5ppd -Discussed smoking cessation.  Will start Nicotine patch.    DVT Prophylaxis:  Heparin 5,000 units q8h Diet: Carb modified   Code Status: Full Family Communication: Pt is awake and alert Disposition Plan: pending PT evaluation  Consultants:  Orthopedic Surgery  Infectious Disease  Procedures:  None  Antibiotics:  IV Cefepime and Vancomycin 11/30>>  Objective: Filed Vitals:   12/19/13 0559 12/19/13 1309 12/19/13 2100 12/20/13 0529  BP: 94/50 132/73 140/87 151/74  Pulse: 82 84 87 92  Temp: 98.4 F (36.9 C) 98.5 F (36.9 C) 100.7 F (38.2 C) 99.7 F (37.6 C)  TempSrc: Oral Oral Oral Oral  Resp: 17 18  20   SpO2: 95% 100% 100% 95%  Intake/Output Summary (Last 24 hours) at 12/20/13 1049 Last data filed at 12/20/13 1027  Gross per 24 hour  Intake   3055 ml  Output   2300 ml  Net    755 ml   Exam: General: Overweight, NAD, appears stated age  33:  Anicteic Sclera, MMM.  Cardiovascular: RRR, S1 S2 auscultated, no rubs, murmurs or gallops.   Respiratory: Clear to auscultation bilaterally with equal chest rise  Abdomen: Soft, nontender, nondistended, + bowel sounds  Extremities: Left lower leg has  dressing and is wrapped.  Some erythema extending above dressing.  Right lower extremity- warm dry without cyanosis clubbing or edema.  Neuro: AAOx3 Psych: Normal affect and demeanor with intact judgement and insight  Data Reviewed: Basic Metabolic Panel:  Recent Labs Lab December 23, 2013 1030 23-Dec-2013 1726 12/19/13 0416  NA 133*  --  136*  K 4.1  --  3.9  CL 95*  --  101  CO2 26  --  26  GLUCOSE 97  --  101*  BUN 5*  --  6  CREATININE 1.07 0.84 0.88  CALCIUM 9.0  --  8.2*   Liver Function Tests:  Recent Labs Lab 12/19/13 0416  AST 41*  ALT 35  ALKPHOS 87  BILITOT 0.3  PROT 7.0  ALBUMIN 1.8*   CBC:  Recent Labs Lab 23-Dec-2013 1030 2013/12/23 1726 12/19/13 0416 12/20/13 0626  WBC 13.3* 10.5 7.5 6.8  NEUTROABS 10.0*  --   --   --   HGB 10.4* 9.9* 9.5* 8.7*  HCT 32.4* 31.0* 30.0* 27.7*  MCV 76.8* 76.9* 77.3* 76.5*  PLT 462* 440* 393 382   CBG:  Recent Labs Lab 12/19/13 0734 12/19/13 1207 12/19/13 1641 12/19/13 2133 12/20/13 0731  GLUCAP 91 134* 144* 121* 113*    Recent Results (from the past 240 hour(s))  Blood Cultures x 2 sites     Status: None (Preliminary result)   Collection Time: 12-23-2013  1:05 PM  Result Value Ref Range Status   Specimen Description BLOOD LEFT ANTECUBITAL  Final   Special Requests BOTTLES DRAWN AEROBIC AND ANAEROBIC 5CC  Final   Culture  Setup Time   Final    2013-12-23 19:20 Performed at [facility]    Culture   Final           BLOOD CULTURE RECEIVED NO GROWTH TO DATE CULTURE WILL BE HELD FOR 5 DAYS BEFORE ISSUING A FINAL NEGATIVE REPORT Performed at [facility]    Report Status PENDING  Incomplete  Blood Cultures x 2 sites     Status: None (Preliminary result)   Collection Time: 12/23/13  1:05 PM  Result Value Ref Range Status   Specimen Description BLOOD RIGHT HAND  Final   Special Requests BOTTLES DRAWN AEROBIC AND ANAEROBIC 5CC  Final   Culture  Setup Time   Final    12-23-2013 19:20 Performed at [facility]    Culture   Final           BLOOD CULTURE RECEIVED NO GROWTH TO DATE CULTURE WILL BE HELD FOR 5 DAYS BEFORE ISSUING A FINAL NEGATIVE REPORT Performed at [facility]    Report Status PENDING  Incomplete   Studies: Dg Ankle Complete Left  12/23/13   CLINICAL DATA:  Diabetic foot.  Cellulitis  EXAM: LEFT ANKLE COMPLETE - 3+ VIEW  COMPARISON:  06/30/2013  FINDINGS: Diffuse soft tissue swelling has progressed. Joint effusion is noted. Joint space is normal.  Negative for  fracture or osteomyelitis.  First metatarsal screw again noted.  IMPRESSION: Diffuse soft tissue swelling and joint effusion. Negative for fracture or osteomyelitis.   Electronically Signed   By: [name] Gallo M.D.   On: 12/18/2013 11:31   Mr Tibia Fibula Left Wo Contrast  12/19/2013   ADDENDUM REPORT: 12/19/2013 15:16  ADDENDUM: The original report was by Dr. Van Clines. The following addendum is by Dr. Van Clines:  In the first sentence under impressions, the word "could" should be stricken.   Electronically Signed   By: Sherryl Barters M.D.   On: 12/19/2013 15:16   12/19/2013   CLINICAL DATA:  Weeping wounds along the lower leg and foot. Plantar ulceration.  EXAM: MRI OF LOWER LEFT EXTREMITY WITHOUT CONTRAST  TECHNIQUE: Multiplanar, multisequence MR imaging of the left tibia/ fibula was performed. No intravenous contrast was administered.  COMPARISON:  12/18/2013  FINDINGS: The distal margins of bilateral knee implants are noted.  Speckled confluent distal tibial marrow edema signal noted with some marginal low T2 signal but not reflected with low T1 signal. There is some faint linear and curvilinear increased T2 signal in the proximal tibial diaphysis on the left. I do not see an obvious a draining sinus tract or sequestrum.  There is diffuse subcutaneous edema in the left calf circumferentially, clearly asymmetric to the right side. High T1 signal in the left soleus and left gluteus medius  muscles, favor fatty atrophy over hematoma given the streaky appearance.  No gas or obvious abscess in the soft tissues.  IMPRESSION: 1. Abnormal confluent edema signal in the distal could left tibial shaft, which terminates somewhat abruptly in the distal shaft. On conventional radiographs, there is some very faint sclerosis along this termination by without characteristic findings of bone infarct or enchondroma. I favor this is being due to a chronic osteomyelitis of the tibial shaft. 2. Diffuse asymmetric subcutaneous edema in the left calf, quite possibly representing cellulitis. 3. Asymmetric fatty atrophy of the left soleus and medial head left gastrocnemius muscles.  Electronically Signed: By: Sherryl Barters M.D. On: 12/18/2013 17:40   Mr Foot Left Wo Contrast  12/18/2013   CLINICAL DATA:  Diabetes. Foot ulcer. Worsening lower extremity pain and swelling. Cellulitis  EXAM: MRI OF THE LEFT FOREFOOT WITHOUT CONTRAST  TECHNIQUE: Multiplanar, multisequence MR imaging was performed. No intravenous contrast was administered.  COMPARISON:  None.  FINDINGS: On some of are images, we performed are osteomyelitis protocol to include the whole foot rather than just before foot. On other sequences, only the forefoot was included.  And metal screw observed at the first tarsometatarsal articulation. Prominent cutaneous and subcutaneous edema along the ankle and dorsal forefoot.  No significant abnormal osseous edema characteristic of osteomyelitis identified. Lisfranc joint alignment appears normal; the Lisfranc ligament, if intact, is obscured by the metal artifact from the adjacent first tarsometatarsal joint screw.  I do not observe definite gas in the soft tissues. There may be some prominent blistering in the medial ankle, correlate with visual inspection.  IMPRESSION: 1. Extensive cutaneous and subcutaneous edema in the ankle and foot without osteomyelitis or drainable abscess identified. may be some blistering  along the medial ankle. Please note that IV contrast was not administered, which can adversely affect assessment for small or abscesses. 2. Screw fixator at the first tarsometatarsal articulation.   Electronically Signed   By: Sherryl Barters M.D.   On: 12/18/2013 17:33   Dg Foot Complete Left  12/18/2013   CLINICAL DATA:  Diabetic  ulcer of foot, wound cellulitis  EXAM: LEFT FOOT - COMPLETE 3+ VIEW  COMPARISON:  10/04/2013  FINDINGS: Prior fusion of first TMT joint.  Diffuse osseous demineralization.  Scattered soft tissue swelling.  Remaining joint spaces preserved.  No acute fracture, dislocation, or bone destruction.  Specifically, no soft tissue gas or radiographic evidence of osteomyelitis identified.  IMPRESSION: Osseous demineralization with diffuse soft tissue swelling LEFT foot.  Post first TMT joint fusion.  No acute osseous abnormalities.   Electronically Signed   By: Lavonia Dana M.D.   On: 12/18/2013 11:32    Scheduled Meds: . ceFEPime (MAXIPIME) IV  2 g Intravenous Q12H  . escitalopram  20 mg Oral q morning - 78G  . folic acid  1 mg Oral Daily  . gabapentin  400 mg Oral QID  . heparin  5,000 Units Subcutaneous 3 times per day  . insulin aspart  0-5 Units Subcutaneous QHS  . living well with diabetes book   Does not apply Once  . methocarbamol  500 mg Oral TID  . multivitamin with minerals  1 tablet Oral Daily  . mupirocin cream   Topical Daily  . pantoprazole  40 mg Oral Daily  . QUEtiapine  200 mg Oral QHS  . sodium chloride  3 mL Intravenous Q12H  . thiamine  100 mg Oral Daily   Or  . thiamine  100 mg Intravenous Daily  . vancomycin  1,000 mg Intravenous Q8H   Continuous Infusions: . sodium chloride 75 mL/hr at 12/20/13 9562    Principal Problem:   Left leg cellulitis Active Problems:   Foot ulcer, left   Alcohol dependence   Generalized anxiety disorder   Type 2 diabetes mellitus with left diabetic foot ulcer   Diabetes mellitus due to underlying condition with  foot ulcer   Adelene Idler PA-S Imogene Burn, PA-C  [facility] Pager [phone]. If 7PM-7AM, please contact night-coverage at [email or web address], password [facility] 12/20/2013, 10:49 AM  LOS: 2 days        Patient seen and examined, chart and data base reviewed.  I agree with the above assessment and plan and have edited the above note  For full details please see Mrs. Imogene Burn PA note.  Marzetta Board, MD [facility] [phone]

## 2013-12-20 NOTE — Progress Notes (Addendum)
Inpatient Diabetes Program Recommendations  AACE/ADA: New Consensus Statement on Inpatient Glycemic Control (2013)  Target Ranges:  Prepandial:   less than 140 mg/dL      Peak postprandial:   less than 180 mg/dL (1-2 hours)      Critically ill patients:  140 - 180 mg/dL     Results for RAWLINS, STUARD (MRN [id number]) as of 12/20/2013 12:41  Ref. Range 12/20/2013 07:31 12/20/2013 12:27  Glucose-Capillary Latest Range: 70-99 mg/dL 113 (H) 126 (H)     Spent time with patient yesterday reviewing basic DM care principles (see my note 12/01).  Spoke with patient again today.  Reviewed patient's A1c with him again and reviewed CBG checking, CBG goals, importance of keeping CBGs under control.  Encouraged patient to check his CBGs at least once daily at home and to vary the time of day he checks (either fasting, before meals, or 2 hours after meals).  Encouraged patient to keep a log book of his CBGs so that he can take his CBGs to all MD appointments.  Patient still needs to watch DM videos.  Have asked patient to please continue to read Living Well with Diabetes book and ask RN questions if they arise.  Have placed Outpatient DM education referral to the [place] and DM management center for OP DM classes after d/c.  RD still needs to see pt about DM diet education.  Patient told me he sees Dr. Stoney Bang in [place].  Is interested in possibly seeking care under another MD.  Have asked care management to see patient to assist him in finding a new PCP if possible.   MD- Patient will need CBG meter Rx at time of d/c/. Use Order # [id number] for CBG meter RX.     Will follow Wyn Quaker RN, MSN, CDE Diabetes Coordinator Inpatient Diabetes Program Team Pager: [phone] (8a-10p)

## 2013-12-20 NOTE — Progress Notes (Signed)
ANTIBIOTIC CONSULT NOTE - FOLLOW UP  Pharmacy Consult: Vancomycin Indication: Diabetic Foot Infection, Chronic L Tibial Osteo, Severe Cellulitis  Dosing Weight: 91 kg  Afebrile  98.9 F (37.2 C) (Oral)    Lab Results  Component Value Date   WBC 6.8 12/20/2013    Labs:  Recent Labs  12/18/13 1030 12/18/13 1726 12/19/13 0416 12/20/13 0626  WBC 13.3* 10.5 7.5 6.8  HGB 10.4* 9.9* 9.5* 8.7*  PLT 462* 440* 393 382  CREATININE 1.07 0.84 0.88  --     Estimated Creatinine Clearance: 120 mL/min (by C-G formula based on Cr of 0.88).   Recent Labs  12/20/13 1555  VANCOTROUGH 20.8*     Microbiology: Recent Results (from the past 720 hour(s))  Blood Cultures x 2 sites     Status: None (Preliminary result)   Collection Time: 12/18/13  1:05 PM  Result Value Ref Range Status   Specimen Description BLOOD LEFT ANTECUBITAL  Final   Special Requests BOTTLES DRAWN AEROBIC AND ANAEROBIC 5CC  Final   Culture  Setup Time   Final    12/18/2013 19:20 Performed at [facility]    Culture   Final           BLOOD CULTURE RECEIVED NO GROWTH TO DATE CULTURE WILL BE HELD FOR 5 DAYS BEFORE ISSUING A FINAL NEGATIVE REPORT Performed at [facility]    Report Status PENDING  Incomplete  Blood Cultures x 2 sites     Status: None (Preliminary result)   Collection Time: 12/18/13  1:05 PM  Result Value Ref Range Status   Specimen Description BLOOD RIGHT HAND  Final   Special Requests BOTTLES DRAWN AEROBIC AND ANAEROBIC 5CC  Final   Culture  Setup Time   Final    12/18/2013 19:20 Performed at [facility]    Culture   Final           BLOOD CULTURE RECEIVED NO GROWTH TO DATE CULTURE WILL BE HELD FOR 5 DAYS BEFORE ISSUING A FINAL NEGATIVE REPORT Performed at [facility]    Report Status PENDING  Incomplete    Current Medication[s] Include: Scheduled:  Scheduled:  . ceFEPime (MAXIPIME) IV  2 g Intravenous Q12H  . docusate sodium  100 mg Oral BID  .  escitalopram  20 mg Oral q morning - 10a  . feeding supplement (PRO-STAT SUGAR FREE 64)  30 mL Oral BID WC  . ferrous sulfate  325 mg Oral BID WC  . folic acid  1 mg Oral Daily  . gabapentin  400 mg Oral QID  . heparin  5,000 Units Subcutaneous 3 times per day  . insulin aspart  0-5 Units Subcutaneous QHS  . living well with diabetes book   Does not apply Once  . methocarbamol  500 mg Oral TID  . multivitamin with minerals  1 tablet Oral Daily  . mupirocin cream   Topical Daily  . nicotine  14 mg Transdermal Daily  . pantoprazole  40 mg Oral Daily  . QUEtiapine  200 mg Oral QHS  . rifampin  300 mg Oral Daily  . sodium chloride  3 mL Intravenous Q12H  . thiamine  100 mg Oral Daily   Or  . thiamine  100 mg Intravenous Daily  . vancomycin  1,000 mg Intravenous Q8H    Infusion[s]: Infusions:  . sodium chloride 75 mL/hr at 12/20/13 0851    Antibiotic[s]: Anti-infectives    Start     Dose/Rate Route  Frequency Ordered Stop   12/20/13 1800  rifampin (RIFADIN) capsule 300 mg     300 mg Oral Daily 12/20/13 1630     12/19/13 0100  vancomycin (VANCOCIN) IVPB 1000 mg/200 mL premix     1,000 mg200 mL/hr over 60 Minutes Intravenous Every 8 hours 12/18/13 1509     12/18/13 1545  vancomycin (VANCOCIN) IVPB 1000 mg/200 mL premix     1,000 mg200 mL/hr over 60 Minutes Intravenous  Once 12/18/13 1509 12/18/13 1944   12/18/13 1530  ceFEPIme (MAXIPIME) 2 g in dextrose 5 % 50 mL IVPB     2 g100 mL/hr over 30 Minutes Intravenous Every 12 hours 12/18/13 1421     12/18/13 1400  metroNIDAZOLE (FLAGYL) tablet 500 mg  Status:  Discontinued     500 mg Oral 3 times per day 12/18/13 1224 12/18/13 1421   12/18/13 1230  cefTRIAXone (ROCEPHIN) 2 g in dextrose 5 % 50 mL IVPB  Status:  Discontinued     2 g100 mL/hr over 30 Minutes Intravenous Every 24 hours 12/18/13 1224 12/18/13 1421     Assessment:  48 y/o male on Vancomycin, Cefepime for Diabetic Foot Infection, Chronic L Tibial Osteo, Severe Cellulitis.   ID consulted and added Rifampin to antibiotics.  Temp 100.7,  WBC trending down.  CrCl > 100.  Vancomycin trough level [drawn early] 20.8 mcg/ml.  Goal of Therapy:   Vancomycin trough level 15-20 mcg/ml  Monitor renal function, WBC, fever curve, any cultures/sensitivities, and clinical progression.  Plan:  1. Continue Vancomycin at current schedule.  [With Vancomycin trough drawn ~ 45 minutes early, real Vanc trough likely < 20 mcg/ml]. Monitor renal function, WBC, fever curve, any cultures/sensitivities, and clinical progression. Follow up Vancomycin levels as clinically indicated.  Kelina Beauchamp, Craig Guess,  Pharm.D.   12/20/2013,5:56 PM

## 2013-12-20 NOTE — Progress Notes (Signed)
INFECTIOUS DISEASE PROGRESS NOTE  ID: [name] is a 48 y.o. 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   Diabetes mellitus due to underlying condition with foot ulcer  Subjective: Without complaints.  Abtx:  Anti-infectives    Start     Dose/Rate Route Frequency Ordered Stop   12/19/13 0100  vancomycin (VANCOCIN) IVPB 1000 mg/200 mL premix     1,000 mg200 mL/hr over 60 Minutes Intravenous Every 8 hours 12/18/13 1509     12/18/13 1545  vancomycin (VANCOCIN) IVPB 1000 mg/200 mL premix     1,000 mg200 mL/hr over 60 Minutes Intravenous  Once 12/18/13 1509 12/18/13 1944   12/18/13 1530  ceFEPIme (MAXIPIME) 2 g in dextrose 5 % 50 mL IVPB     2 g100 mL/hr over 30 Minutes Intravenous Every 12 hours 12/18/13 1421     12/18/13 1400  metroNIDAZOLE (FLAGYL) tablet 500 mg  Status:  Discontinued     500 mg Oral 3 times per day 12/18/13 1224 12/18/13 1421   12/18/13 1230  cefTRIAXone (ROCEPHIN) 2 g in dextrose 5 % 50 mL IVPB  Status:  Discontinued     2 g100 mL/hr over 30 Minutes Intravenous Every 24 hours 12/18/13 1224 12/18/13 1421      Medications:  Scheduled: . ceFEPime (MAXIPIME) IV  2 g Intravenous Q12H  . docusate sodium  100 mg Oral BID  . escitalopram  20 mg Oral q morning - 10a  . feeding supplement (PRO-STAT SUGAR FREE 64)  30 mL Oral BID WC  . ferrous sulfate  325 mg Oral BID WC  . folic acid  1 mg Oral Daily  . gabapentin  400 mg Oral QID  . heparin  5,000 Units Subcutaneous 3 times per day  . insulin aspart  0-5 Units Subcutaneous QHS  . living well with diabetes book   Does not apply Once  . methocarbamol  500 mg Oral TID  . multivitamin with minerals  1 tablet Oral Daily  . mupirocin cream   Topical Daily  . nicotine  14 mg Transdermal Daily  . pantoprazole  40 mg Oral Daily  . QUEtiapine  200 mg Oral QHS  .  sodium chloride  3 mL Intravenous Q12H  . thiamine  100 mg Oral Daily   Or  . thiamine  100 mg Intravenous Daily  . vancomycin  1,000 mg Intravenous Q8H    Objective: Vital signs in last 24 hours: Temp:  [98.9 F (37.2 C)-100.7 F (38.2 C)] 98.9 F (37.2 C) (12/02 1547) Pulse Rate:  [87-92] 87 (12/02 1547) Resp:  [16-20] 16 (12/02 1547) BP: (140-152)/(72-87) 152/72 mmHg (12/02 1547) SpO2:  [95 %-100 %] 99 % (12/02 1547)   General appearance: cooperative and no distress Extremities: LLE is wrapped.   Lab Results  Recent Labs  12/18/13 1030 12/18/13 1726 12/19/13 0416 12/20/13 0626  WBC 13.3* 10.5 7.5 6.8  HGB 10.4* 9.9* 9.5* 8.7*  HCT 32.4* 31.0* 30.0* 27.7*  NA 133*  --  136*  --   K 4.1  --  3.9  --   CL 95*  --  101  --   CO2 26  --  26  --   BUN 5*  --  6  --   CREATININE 1.07 0.84 0.88  --    Liver  Panel  Recent Labs  12/19/13 0416  PROT 7.0  ALBUMIN 1.8*  AST 41*  ALT 35  ALKPHOS 87  BILITOT 0.3   Sedimentation Rate  Recent Labs  12/19/13 1700  ESRSEDRATE 110*   C-Reactive Protein  Recent Labs  12/18/13 1030 12/19/13 1700  CRP 14.9* 12.2*    Microbiology: Recent Results (from the past 240 hour(s))  Blood Cultures x 2 sites     Status: None (Preliminary result)   Collection Time: 12/18/13  1:05 PM  Result Value Ref Range Status   Specimen Description BLOOD LEFT ANTECUBITAL  Final   Special Requests BOTTLES DRAWN AEROBIC AND ANAEROBIC 5CC  Final   Culture  Setup Time   Final    12/18/2013 19:20 Performed at [facility]    Culture   Final           BLOOD CULTURE RECEIVED NO GROWTH TO DATE CULTURE WILL BE HELD FOR 5 DAYS BEFORE ISSUING A FINAL NEGATIVE REPORT Performed at [facility]    Report Status PENDING  Incomplete  Blood Cultures x 2 sites     Status: None (Preliminary result)   Collection Time: 12/18/13  1:05 PM  Result Value Ref Range Status   Specimen Description BLOOD RIGHT HAND  Final   Special  Requests BOTTLES DRAWN AEROBIC AND ANAEROBIC 5CC  Final   Culture  Setup Time   Final    12/18/2013 19:20 Performed at [facility]    Culture   Final           BLOOD CULTURE RECEIVED NO GROWTH TO DATE CULTURE WILL BE HELD FOR 5 DAYS BEFORE ISSUING A FINAL NEGATIVE REPORT Performed at [facility]    Report Status PENDING  Incomplete    Studies/Results: Mr Tibia Fibula Left Wo Contrast  12/19/2013   ADDENDUM REPORT: 12/19/2013 15:16  ADDENDUM: The original report was by Dr. Van Clines. The following addendum is by Dr. Van Clines:  In the first sentence under impressions, the word "could" should be stricken.   Electronically Signed   By: Sherryl Barters M.D.   On: 12/19/2013 15:16   12/19/2013   CLINICAL DATA:  Weeping wounds along the lower leg and foot. Plantar ulceration.  EXAM: MRI OF LOWER LEFT EXTREMITY WITHOUT CONTRAST  TECHNIQUE: Multiplanar, multisequence MR imaging of the left tibia/ fibula was performed. No intravenous contrast was administered.  COMPARISON:  12/18/2013  FINDINGS: The distal margins of bilateral knee implants are noted.  Speckled confluent distal tibial marrow edema signal noted with some marginal low T2 signal but not reflected with low T1 signal. There is some faint linear and curvilinear increased T2 signal in the proximal tibial diaphysis on the left. I do not see an obvious a draining sinus tract or sequestrum.  There is diffuse subcutaneous edema in the left calf circumferentially, clearly asymmetric to the right side. High T1 signal in the left soleus and left gluteus medius muscles, favor fatty atrophy over hematoma given the streaky appearance.  No gas or obvious abscess in the soft tissues.  IMPRESSION: 1. Abnormal confluent edema signal in the distal could left tibial shaft, which terminates somewhat abruptly in the distal shaft. On conventional radiographs, there is some very faint sclerosis along this termination by without  characteristic findings of bone infarct or enchondroma. I favor this is being due to a chronic osteomyelitis of the tibial shaft. 2. Diffuse asymmetric subcutaneous edema in the left calf, quite possibly representing cellulitis. 3. Asymmetric  fatty atrophy of the left soleus and medial head left gastrocnemius muscles.  Electronically Signed: By: Sherryl Barters M.D. On: 12/18/2013 17:40   Mr Foot Left Wo Contrast  12/18/2013   CLINICAL DATA:  Diabetes. Foot ulcer. Worsening lower extremity pain and swelling. Cellulitis  EXAM: MRI OF THE LEFT FOREFOOT WITHOUT CONTRAST  TECHNIQUE: Multiplanar, multisequence MR imaging was performed. No intravenous contrast was administered.  COMPARISON:  None.  FINDINGS: On some of are images, we performed are osteomyelitis protocol to include the whole foot rather than just before foot. On other sequences, only the forefoot was included.  And metal screw observed at the first tarsometatarsal articulation. Prominent cutaneous and subcutaneous edema along the ankle and dorsal forefoot.  No significant abnormal osseous edema characteristic of osteomyelitis identified. Lisfranc joint alignment appears normal; the Lisfranc ligament, if intact, is obscured by the metal artifact from the adjacent first tarsometatarsal joint screw.  I do not observe definite gas in the soft tissues. There may be some prominent blistering in the medial ankle, correlate with visual inspection.  IMPRESSION: 1. Extensive cutaneous and subcutaneous edema in the ankle and foot without osteomyelitis or drainable abscess identified. may be some blistering along the medial ankle. Please note that IV contrast was not administered, which can adversely affect assessment for small or abscesses. 2. Screw fixator at the first tarsometatarsal articulation.   Electronically Signed   By: Sherryl Barters M.D.   On: 12/18/2013 17:33     Assessment/Plan: Diabetic Foot Ulcer Chronic Osteo L tibial shaft Cellulitis,  severe  Total days of antibiotics: 3 vanco/cefepime  HIV (-) ABI mildly reduced on L Consider adding rifampin due to presence of hardware BCx ngtd.          [name] Infectious Diseases (pager) [phone] [email or web address] 12/20/2013, 4:24 PM  LOS: 2 days

## 2013-12-20 NOTE — Evaluation (Signed)
Physical Therapy Evaluation Patient Details Name: [name] MRN: [id number] DOB: 1965-01-23 Today's Date: 12/20/2013   History of Present Illness    48 yo male with PMH of diabetes mellitus with chronic diabetic foot ulcer in the plantar region of left foot, diabetic neuropathy, hx of polysubstance abuse, who presents to the ED with complaints of worsening left lower extremity pain and swelling. Recently hospitalized for 1 week for left lower extremity cellulitis and discharged 1 week prior to admission on clindamycin. Compliant with medication. Since discharge he reports severe pain in his left lower extremity with increased swelling, erythema, and weeping from wounds. Seen by wound care at home. Reports fever and chills with temperature of 100.4 last week. Seen in office by Dr. Sharol Given who advised him to return to the hospital for admission. 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    Clinical Impression  Pt admitted with above. Pt currently with functional limitations due to the deficits listed below (see PT Problem List). Pt should be able to go home with HHPT f/u.  Needs to use RW for safety and pt in agreement.  Pt will benefit from skilled PT to increase their independence and safety with mobility to allow discharge to the venue listed below.     Follow Up Recommendations Home health PT;Supervision/Assistance - 24 hour    Equipment Recommendations  None recommended by PT    Recommendations for Other Services       Precautions / Restrictions Precautions Precautions: Fall Restrictions Weight Bearing Restrictions: No      Mobility  Bed Mobility Overal bed mobility: Needs Assistance Bed Mobility: Supine to Sit     Supine to sit: Min guard     General bed mobility comments: Guard assist for safety as pt impulsive with movements.   Transfers Overall transfer level: Needs assistance Equipment used: Rolling  walker (2 wheeled) Transfers: Sit to/from [facility] Sit to Stand: Min guard Stand pivot transfers: Min guard       General transfer comment: Pt quick to stand and grabbing for UE support to use urinal.  Pt able to use urinal with other hand holding onto sink.  Once finished pt unsteady if he let go of sink.    Ambulation/Gait Ambulation/Gait assistance: Min guard Ambulation Distance (Feet): 45 Feet Assistive device: Rolling walker (2 wheeled) Gait Pattern/deviations: Step-through pattern;Decreased stride length;Trunk flexed;Wide base of support   Gait velocity interpretation: Below normal speed for age/gender General Gait Details: Pt wore gripper sock on right LE and had dressing on LLE.  Can bear a little weight on his left LE without too much pain.  Pt ambulated with RW with good technique.  Needed occasional cues for walker safety with turns and challenges.  Pt moves impulsively as well needing cues for this as well.    Stairs            Wheelchair Mobility    Modified Rankin (Stroke Patients Only)       Balance Overall balance assessment: Needs assistance;History of Falls         Standing balance support: During functional activity;Bilateral upper extremity supported Standing balance-Leahy Scale: Poor Standing balance comment: Needs UE support for balance at present.                               Pertinent Vitals/Pain Pain Assessment: 0-10 Pain Score: 9  Pain Location: left  LE Pain Descriptors / Indicators: Aching Pain Intervention(s): Limited activity within patient's tolerance;Monitored during session;Premedicated before session;Repositioned  VSS    Home Living Family/patient expects to be discharged to:: Private residence Living Arrangements: Alone Available Help at Discharge: Available PRN/intermittently Type of Home: Mobile home Home Access: Stairs to enter Entrance Stairs-Rails: Right;Left;Can reach both Entrance  Stairs-Number of Steps: 8 Home Layout: One level Home Equipment: Cane - single point;Walker - 2 wheels;Bedside commode      Prior Function Level of Independence: Independent         Comments: Pt drives     Hand Dominance        Extremity/Trunk Assessment   Upper Extremity Assessment: Defer to OT evaluation           Lower Extremity Assessment: Generalized weakness         Communication   Communication: No difficulties  Cognition Arousal/Alertness: Awake/alert Behavior During Therapy: WFL for tasks assessed/performed Overall Cognitive Status: Within Functional Limits for tasks assessed Area of Impairment: Safety/judgement         Safety/Judgement: Decreased awareness of safety     General Comments: Slightly impulsive with movements at times needing cues for safety.    General Comments      Exercises        Assessment/Plan    PT Assessment Patient needs continued PT services  PT Diagnosis Difficulty walking;Acute pain   PT Problem List Decreased activity tolerance;Decreased balance;Decreased mobility;Decreased knowledge of use of DME;Decreased safety awareness;Decreased knowledge of precautions;Pain  PT Treatment Interventions DME instruction;Gait training;Functional mobility training;Therapeutic activities;Therapeutic exercise;Balance training;Patient/family education;Stair training   PT Goals (Current goals can be found in the Care Plan section) Acute Rehab PT Goals Patient Stated Goal: to go home PT Goal Formulation: With patient Time For Goal Achievement: 12/27/13 Potential to Achieve Goals: Good    Frequency Min 3X/week   Barriers to discharge Decreased caregiver support      Co-evaluation               End of Session Equipment Utilized During Treatment: Gait belt Activity Tolerance: Patient limited by fatigue;Patient limited by pain Patient left: in chair;with call bell/phone within reach;with chair alarm set Nurse  Communication: Mobility status;Patient requests pain meds         Time: 6568-1275 PT Time Calculation (min) (ACUTE ONLY): 24 min   Charges:   PT Evaluation $Initial PT Evaluation Tier I: 1 Procedure PT Treatments $Gait Training: 8-22 mins   PT G CodesDenice Paradise 12-29-2013, 1:00 PM Hennessy Bartel,PT Acute Rehabilitation [phone] (pager)

## 2013-12-20 NOTE — Progress Notes (Signed)
INITIAL NUTRITION ASSESSMENT  DOCUMENTATION CODES Per approved criteria  -Not Applicable   INTERVENTION: - Prostat BID, each supplement provides 100 kcal and 15 g protein. - Multivitamin with minerals daily. - RD will continue to follow for nutrition care plan.  NUTRITION DIAGNOSIS: Increased nutrient needs related to wound healing as evidenced by diabetic foot ulcer.  Goal: Pt to meet >/= 90% of their estimated nutrition needs   Monitor:  Weight trend, wound healing, po intake, labs  Reason for Assessment: consult for wound healing and diet education.  48 y.o. male  Admitting Dx: Left leg cellulitis  ASSESSMENT: 48 y.o. male with a past medical history of diabetes mellitus with chronic diabetic foot ulcer in plantar region of left foot, diabetic neuropathy, history of alcohol abuse, who presents to the emergency room with complaints of worsening left lower extremity pain and swelling.   - Pt reports a recent increase in weight. Per chart history, pt's weight has decreased (error in weight entry?). Pt reports that his appetite is good. Pt eating 100%. - Pt with no signs of fat or muscle wasting.   Labs: Na and albumin low K and BUN WNL  Height: Ht Readings from Last 1 Encounters:  12/03/13 6\' 3"  (1.905 m)    Weight: Wt Readings from Last 1 Encounters:  12/03/13 200 lb (90.719 kg)    Ideal Body Weight: 84.5 kg  % Ideal Body Weight: 107%  Wt Readings from Last 10 Encounters:  12/03/13 200 lb (90.719 kg)  11/21/13 220 lb (99.791 kg)  10/04/13 220 lb (99.791 kg)  07/18/13 217 lb 12.8 oz (98.793 kg)  07/12/13 220 lb (99.791 kg)  06/30/13 220 lb (99.791 kg)  06/02/13 215 lb (97.523 kg)  03/24/13 205 lb (92.987 kg)  03/24/13 200 lb (90.719 kg)  12/02/12 194 lb (87.998 kg)    Usual Body Weight: 215-220 lbs  % Usual Body Weight: 93%  BMI:  There is no weight on file to calculate BMI.  Estimated Nutritional Needs: Kcal: 2100-2300 Protein: 130-140 g Fluid:  2.1-2.3 L/day  Skin: diabetic ulcer on left foot  Diet Order: Diet Carb Modified  EDUCATION NEEDS: -Education needs addressed   Intake/Output Summary (Last 24 hours) at 12/20/13 1355 Last data filed at 12/20/13 1027  Gross per 24 hour  Intake   2815 ml  Output   2000 ml  Net    815 ml    Last BM: prior to admission   Labs:   Recent Labs Lab 12/18/13 1030 12/18/13 1726 12/19/13 0416  NA 133*  --  136*  K 4.1  --  3.9  CL 95*  --  101  CO2 26  --  26  BUN 5*  --  6  CREATININE 1.07 0.84 0.88  CALCIUM 9.0  --  8.2*  GLUCOSE 97  --  101*    CBG (last 3)   Recent Labs  12/19/13 2133 12/20/13 0731 12/20/13 1227  GLUCAP 121* 113* 126*    Scheduled Meds: . ceFEPime (MAXIPIME) IV  2 g Intravenous Q12H  . docusate sodium  100 mg Oral BID  . escitalopram  20 mg Oral q morning - 10a  . ferrous sulfate  325 mg Oral BID WC  . folic acid  1 mg Oral Daily  . gabapentin  400 mg Oral QID  . heparin  5,000 Units Subcutaneous 3 times per day  . insulin aspart  0-5 Units Subcutaneous QHS  . living well with diabetes book   Does  not apply Once  . methocarbamol  500 mg Oral TID  . multivitamin with minerals  1 tablet Oral Daily  . mupirocin cream   Topical Daily  . nicotine  14 mg Transdermal Daily  . pantoprazole  40 mg Oral Daily  . QUEtiapine  200 mg Oral QHS  . sodium chloride  3 mL Intravenous Q12H  . thiamine  100 mg Oral Daily   Or  . thiamine  100 mg Intravenous Daily  . vancomycin  1,000 mg Intravenous Q8H    Continuous Infusions: . sodium chloride 75 mL/hr at 12/20/13 6834    Past Medical History  Diagnosis Date  . Diabetic foot ulcer   . ETOH abuse   . Anxiety   . Open wound     bottom of foot  . Diabetes mellitus without complication     borderline  . Neuromuscular disorder     neuropathy  . GERD (gastroesophageal reflux disease)     tums  . Pneumonia ~ 2012  . History of blood transfusion     "related to left knee OR; probably right hip  too" (04/21/2012)  . Stroke 2008    "they said I might have had one during right hip replacement" (04/21/2012)  . Arthritis     "everywhere" (04/21/2012)  . Mental disorder   . Depression   . DDD (degenerative disc disease)   . Cellulitis and abscess of foot 12/19/2013    LEFT FOOT    Past Surgical History  Procedure Laterality Date  . Total hip arthroplasty Right 2008  . Total knee arthroplasty Left 2006  . Joint replacement    . Lung lobectomy Left ~ 2006  . Revision total hip arthroplasty Right 2008    "4-5 months after replacement" (04/21/2012)  . Knee arthroscopy Bilateral 1980's/1990's  . Lung lobectomy    . Metatarsal osteotomy  10/29/2011    Procedure: METATARSAL OSTEOTOMY;  Surgeon: [name], MD;  Location: [place];  Service: Orthopedics;  Laterality: Left;  Left 1st Metatarsal Dorsal Closing Wedge   . Total knee arthroplasty Right 04/20/2012  . Total knee arthroplasty Right 04/20/2012    Procedure: TOTAL KNEE ARTHROPLASTY;  Surgeon: [name], MD;  Location: [place];  Service: Orthopedics;  Laterality: Right;  Right Total Knee Arthroplasty    Laurette Schimke MS, RD, LDN

## 2013-12-20 NOTE — Plan of Care (Signed)
Problem: Food- and Nutrition-Related Knowledge Deficit (NB-1.1) Goal: Nutrition education Formal process to instruct or train a patient/client in a skill or to impart knowledge to help patients/clients voluntarily manage or modify food choices and eating behavior to maintain or improve health. Outcome: Completed/Met Date Met:  12/20/13  RD consulted for nutrition education regarding diabetes.     Lab Results  Component Value Date    HGBA1C 7.7* 12/18/2013    RD provided "Carbohydrate Counting for People with Diabetes" handout from the Academy of [employer] Dietetics. Discussed different food groups and their effects on blood sugar, emphasizing carbohydrate-containing foods. Provided list of carbohydrates and recommended serving sizes of common foods.  Discussed importance of controlled and consistent carbohydrate intake throughout the day. Provided examples of ways to balance meals/snacks and encouraged intake of high-fiber, whole grain complex carbohydrates. Teach back method used.  Expect good compliance.  There is no weight on file to calculate BMI. Pt meets criteria for overweight based on current BMI.  Current diet order is carbohydrate modified, patient is consuming approximately 100% of meals at this time. Labs and medications reviewed. No further nutrition interventions warranted at this time. RD contact information provided. If additional nutrition issues arise, please re-consult RD.  Laurette Schimke MS, RD, LDN

## 2013-12-21 DIAGNOSIS — M86662 Other chronic osteomyelitis, left tibia and fibula: Secondary | ICD-10-CM

## 2013-12-21 LAB — CBC
HCT: 29 % — ABNORMAL LOW (ref 39.0–52.0)
HEMOGLOBIN: 9.1 g/dL — AB (ref 13.0–17.0)
MCH: 24.2 pg — AB (ref 26.0–34.0)
MCHC: 31.4 g/dL (ref 30.0–36.0)
MCV: 77.1 fL — ABNORMAL LOW (ref 78.0–100.0)
PLATELETS: 382 10*3/uL (ref 150–400)
RBC: 3.76 MIL/uL — AB (ref 4.22–5.81)
RDW: 18 % — ABNORMAL HIGH (ref 11.5–15.5)
WBC: 6.1 10*3/uL (ref 4.0–10.5)

## 2013-12-21 LAB — GLUCOSE, CAPILLARY
GLUCOSE-CAPILLARY: 104 mg/dL — AB (ref 70–99)
Glucose-Capillary: 99 mg/dL (ref 70–99)
Glucose-Capillary: 175 mg/dL — ABNORMAL HIGH (ref 70–99)
Glucose-Capillary: 102 mg/dL — ABNORMAL HIGH (ref 70–99)

## 2013-12-21 MED ORDER — SODIUM CHLORIDE 0.9 % IJ SOLN: 10.0000 mL | INTRAMUSCULAR | Status: DC | PRN

## 2013-12-21 MED ORDER — MORPHINE SULFATE 4 MG/ML IJ SOLN
4.0000 mg | Freq: Four times a day (QID) | INTRAMUSCULAR | Status: DC
Start: 2013-12-21 — End: 2013-12-22
  Administered 2013-12-21 – 2013-12-22 (×5): 4 mg via INTRAVENOUS
  Filled 2013-12-21 (×5): qty 1

## 2013-12-21 MED ORDER — OXYCODONE HCL 5 MG PO TABS
10.0000 mg | ORAL_TABLET | ORAL | Status: DC | PRN
  Administered 2013-12-21 – 2013-12-22 (×4): 10 mg via ORAL
  Filled 2013-12-21 (×4): qty 2

## 2013-12-21 NOTE — Clinical Social Work Note (Signed)
Referred to CSW today for meds assistance- RNCM also aware of this referral and to assist patient as able-  CSW to sign off- please contact us if SW needs arise. Eduard Clos, MSW, [place]

## 2013-12-21 NOTE — Progress Notes (Signed)
INFECTIOUS DISEASE PROGRESS NOTE  ID: [name] is a 48 y.o. 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   Diabetes mellitus due to underlying condition with foot ulcer   Chronic osteomyelitis of left tibia  Subjective: Without complaints  Abtx:  Anti-infectives    Start     Dose/Rate Route Frequency Ordered Stop   12/20/13 1800  rifampin (RIFADIN) capsule 300 mg     300 mg Oral Daily 12/20/13 1630     12/19/13 0100  vancomycin (VANCOCIN) IVPB 1000 mg/200 mL premix     1,000 mg200 mL/hr over 60 Minutes Intravenous Every 8 hours 12/18/13 1509     12/18/13 1545  vancomycin (VANCOCIN) IVPB 1000 mg/200 mL premix     1,000 mg200 mL/hr over 60 Minutes Intravenous  Once 12/18/13 1509 12/18/13 1944   12/18/13 1530  ceFEPIme (MAXIPIME) 2 g in dextrose 5 % 50 mL IVPB     2 g100 mL/hr over 30 Minutes Intravenous Every 12 hours 12/18/13 1421     12/18/13 1400  metroNIDAZOLE (FLAGYL) tablet 500 mg  Status:  Discontinued     500 mg Oral 3 times per day 12/18/13 1224 12/18/13 1421   12/18/13 1230  cefTRIAXone (ROCEPHIN) 2 g in dextrose 5 % 50 mL IVPB  Status:  Discontinued     2 g100 mL/hr over 30 Minutes Intravenous Every 24 hours 12/18/13 1224 12/18/13 1421      Medications:  Scheduled: . ceFEPime (MAXIPIME) IV  2 g Intravenous Q12H  . docusate sodium  100 mg Oral BID  . escitalopram  20 mg Oral q morning - 10a  . feeding supplement (PRO-STAT SUGAR FREE 64)  30 mL Oral BID WC  . ferrous sulfate  325 mg Oral BID WC  . folic acid  1 mg Oral Daily  . gabapentin  400 mg Oral QID  . heparin  5,000 Units Subcutaneous 3 times per day  . insulin aspart  0-5 Units Subcutaneous QHS  . living well with diabetes book   Does not apply Once  . methocarbamol  500 mg Oral TID  .  morphine injection  4 mg Intravenous Q6H  . multivitamin with  minerals  1 tablet Oral Daily  . mupirocin cream   Topical Daily  . nicotine  14 mg Transdermal Daily  . pantoprazole  40 mg Oral Daily  . QUEtiapine  200 mg Oral QHS  . rifampin  300 mg Oral Daily  . sodium chloride  3 mL Intravenous Q12H  . thiamine  100 mg Oral Daily   Or  . thiamine  100 mg Intravenous Daily  . vancomycin  1,000 mg Intravenous Q8H    Objective: Vital signs in last 24 hours: Temp:  [98 F (36.7 C)-99 F (37.2 C)] 98.9 F (37.2 C) (12/03 1426) Pulse Rate:  [81-84] 84 (12/03 1426) Resp:  [16-18] 18 (12/03 1426) BP: (136-155)/(75-76) 136/75 mmHg (12/03 1426) SpO2:  [97 %-99 %] 99 % (12/03 1426)   General appearance: alert, cooperative and no distress Extremities: LLE wrapped, decreased erythema proximaly and distally  Lab Results  Recent Labs  12/19/13 0416 12/20/13 0626 12/21/13 0522  WBC 7.5 6.8 6.1  HGB 9.5* 8.7* 9.1*  HCT 30.0* 27.7* 29.0*  NA 136*  --   --   K 3.9  --   --  CL 101  --   --   CO2 26  --   --   BUN 6  --   --   CREATININE 0.88  --   --    Liver Panel  Recent Labs  12/19/13 0416  PROT 7.0  ALBUMIN 1.8*  AST 41*  ALT 35  ALKPHOS 87  BILITOT 0.3   Sedimentation Rate  Recent Labs  12/19/13 1700  ESRSEDRATE 110*   C-Reactive Protein  Recent Labs  12/19/13 1700  CRP 12.2*    Microbiology: Recent Results (from the past 240 hour(s))  Blood Cultures x 2 sites     Status: None (Preliminary result)   Collection Time: 12/18/13  1:05 PM  Result Value Ref Range Status   Specimen Description BLOOD LEFT ANTECUBITAL  Final   Special Requests BOTTLES DRAWN AEROBIC AND ANAEROBIC 5CC  Final   Culture  Setup Time   Final    12/18/2013 19:20 Performed at [facility]    Culture   Final           BLOOD CULTURE RECEIVED NO GROWTH TO DATE CULTURE WILL BE HELD FOR 5 DAYS BEFORE ISSUING A FINAL NEGATIVE REPORT Performed at [facility]    Report Status PENDING  Incomplete  Blood Cultures x 2 sites      Status: None (Preliminary result)   Collection Time: 12/18/13  1:05 PM  Result Value Ref Range Status   Specimen Description BLOOD RIGHT HAND  Final   Special Requests BOTTLES DRAWN AEROBIC AND ANAEROBIC 5CC  Final   Culture  Setup Time   Final    12/18/2013 19:20 Performed at [facility]    Culture   Final           BLOOD CULTURE RECEIVED NO GROWTH TO DATE CULTURE WILL BE HELD FOR 5 DAYS BEFORE ISSUING A FINAL NEGATIVE REPORT Performed at [facility]    Report Status PENDING  Incomplete    Studies/Results: No results found.   Assessment/Plan: Diabetic Foot Ulcer Chronic Osteo L tibial shaft Cellulitis, severe  Total days of antibiotics: 4 vanco/cefepime/rifampin  He appears to be making some progress.  He will need close f/u from ortho, wound, ID Would plan for 4 weeks of IV therapy.  Will see him in ID clinic         Bobby Rumpf Infectious Diseases (pager) [phone] [email or web address] 12/21/2013, 6:40 PM  LOS: 3 days

## 2013-12-21 NOTE — Progress Notes (Signed)
Peripherally Inserted Central Catheter/Midline Placement  The IV Nurse has discussed with the patient and/or persons authorized to consent for the patient, the purpose of this procedure and the potential benefits and risks involved with this procedure.  The benefits include less needle sticks, lab draws from the catheter and patient may be discharged home with the catheter.  Risks include, but not limited to, infection, bleeding, blood clot (thrombus formation), and puncture of an artery; nerve damage and irregular heat beat.  Alternatives to this procedure were also discussed.  PICC/Midline Placement Documentation        [name] 12/21/2013, 10:25 AM

## 2013-12-21 NOTE — Progress Notes (Signed)
Physical Therapy Treatment Patient Details Name: [name] MRN: [id number] DOB: 1965-03-26 Today's Date: 12/21/2013    History of Present Illness  Pt is a 48 yo male with PMH of diabetes mellitus with chronic diabetic foot ulcer in the plantar region of left foot, diabetic neuropathy, hx of polysubstance abuse, who presents to the ED with complaints of worsening left lower extremity pain and swelling. Recently hospitalized for 1 week for left lower extremity cellulitis and discharged 1 week prior to admission on clindamycin. Compliant with medication. Since discharge he reports severe pain in his left lower extremity with increased swelling, erythema, and weeping from wounds. Seen by wound care at home. Reports fever and chills with temperature of 100.4 last week. Seen in office by Dr. Sharol Given who advised him to return to the hospital for admission. 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    PT Comments    Pt currently with functional limitations due to decreased balance and mobility as well as increased LLE pain. Pt will benefit from skilled PT to increase independence and safety with mobility to allow discharge to home with HHPT. Pt showing improved endurance compared to PT session yesterday. Pt ambulated with both a cane and RW and no significant difference in safety or stability was found between the two. Pt's preference is to use his cane from home. Pt able to safely negotiate 4 stairs and believes he will be able to get up 8 stairs at his home. Pt continues to be quick to move which can cause instability. Pt had episode of instability and was able to self correct.     Follow Up Recommendations  Home health PT;Supervision/Assistance - 24 hour     Equipment Recommendations  None recommended by PT    Recommendations for Other Services       Precautions / Restrictions Precautions Precautions: Fall Restrictions Weight  Bearing Restrictions: No    Mobility  Bed Mobility Overal bed mobility: Needs Assistance Bed Mobility: Supine to Sit     Supine to sit: Min guard;HOB elevated     General bed mobility comments: Pt able to transfer supine to sit without cuing from PT but did have HOB elevated in chair position.   Transfers Overall transfer level: Needs assistance Equipment used: Straight cane Transfers: Sit to/from Stand Sit to Stand: Min guard         General transfer comment: Pt requiring min guard assist for safety due to patient's tendency to move quickly and poor bilateral knee stability. Pt able to stand statically once he has found his balance.   Ambulation/Gait Ambulation/Gait assistance: Min guard Ambulation Distance (Feet): 400 Feet Assistive device: Rolling walker (2 wheeled);Straight cane Gait Pattern/deviations: Step-through pattern;Decreased stride length   Gait velocity interpretation: Below normal speed for age/gender General Gait Details: Pt ambulated today with sneaker on RLE and boot shoe on LLE. Pt demonstrates poor bilateral knee stability which can contribute to loss of balance. Pt had episode of loss of balance which he was able to self correct. Pt ambulated both with a RW and with his cane from home and no significant difference in stability or safety was seen. Pt's preference is to use his cane. Pt demonstrated improved endurance during today's session but was noting fatigue by the end of the walk.   Stairs Stairs: Yes Stairs assistance: Min guard Stair Management: Two rails;Step to pattern;Forwards Number of Stairs: 4 General stair comments: Pt able to safely negotiate 4 stairs  using bilateral railings for support. Pt deems his LLE as his stronger limb and was more comfortable leading with this leg and ascending and descending. Pt feels comfortable with this strategy and was safe. Pt stated he believes he will be able to negotiate his 8 stairs at home.   Wheelchair  Mobility    Modified Rankin (Stroke Patients Only)       Balance Overall balance assessment: Needs assistance         Standing balance support: During functional activity;Single extremity supported Standing balance-Leahy Scale: Fair Standing balance comment: Pt was able to stand statically in the hall without support, but for ambulation pt requires either a cane or a walker.                     Cognition Arousal/Alertness: Awake/alert Behavior During Therapy: WFL for tasks assessed/performed Overall Cognitive Status: Within Functional Limits for tasks assessed Area of Impairment: Safety/judgement         Safety/Judgement: Decreased awareness of safety     General Comments: Slightly impulsive with movements at times needing cues for safety.    Exercises      General Comments  Pt ambulated with sneaker on RLE and boot shoe on LLE.       Pertinent Vitals/Pain Pain Assessment: No/denies pain    Home Living                      Prior Function            PT Goals (current goals can now be found in the care plan section) Acute Rehab PT Goals PT Goal Formulation: With patient Time For Goal Achievement: 12/27/13 Potential to Achieve Goals: Good Progress towards PT goals: Progressing toward goals    Frequency  Min 3X/week    PT Plan Current plan remains appropriate    Co-evaluation             End of Session Equipment Utilized During Treatment: Gait belt Activity Tolerance: Patient tolerated treatment well Patient left: in bed;with call bell/phone within reach     Time: 1127-1151 PT Time Calculation (min) (ACUTE ONLY): 24 min  Charges:  $Gait Training: 23-37 mins                    G CodesJearld Shines SPT 12/21/2013, 12:07 PM  Jearld Shines, [place]  [facility] [phone]

## 2013-12-21 NOTE — Progress Notes (Signed)
Patient ID: [name], male   DOB: 03/12/1965, 48 y.o.   MRN: [id number] Cellulitis and clear drainage from the left leg is slowly improving. I agree with discharging on IV antibiotics. I will follow-up in the office after discharge and place the patient in a medical compression sock. Reinforced the importance of elevation once patient is discharged. There is no tenderness to palpation around the leg no pain to palpation in the thigh. There is no crepitation to palpation.

## 2013-12-21 NOTE — Progress Notes (Signed)
PROGRESS NOTE  [name] EZM:[id number] DOB: 1965-09-05 DOA: 12/18/2013 PCP: No PCP Per Patient  HPI: 48 yo male with PMH of diabetes mellitus with chronic diabetic foot ulcer in the plantar region of left foot, diabetic neuropathy, hx of polysubstance abuse, who presents to the ED with complaints of worsening left lower extremity pain and swelling. Recently hospitalized for 1 week for left lower extremity cellulitis and discharged 1 week prior to admission on clindamycin.  Compliant with medication.  Since discharge he reports severe pain in his left lower extremity with increased swelling, erythema, and weeping from wounds.  Seen by wound care at home.  Reports fever and chills with temperature of 100.4 last week.  Seen in office by Dr. Sharol Given who advised him to return to the hospital for admission.  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        Subjective: Patient still with pain.  Concerned about managing the IV antibiotics at home.  No other complaints.     Assessment/Plan: Cellulitis of left leg with possible chronic deep osteomyelitis of the left tibia:  - Chronic diabetic foot ulcer involving left plantar aspect of left foot  - Hospitalized approximately 2 weeks ago at [facility] for cellulitus of left lower extremity treated with IV abx for 7 days then followed by outpatient clindamycin. Improved while on IV antibiotics but got worse at home while on oral agents.  - MRI as below with chronic osteo, elevated inflammatory markers  - Appreciate Dr. Jess Barters input, no need for surgicial intervention at this time - ID consulted and recommending longer term abx therapy for chronic osteomyelitis. - Continue IV Cefepime and Vancomycin.  Rifampin added due to hardware. - PICC line placed on 12/3.  Pain medications adjusted.  IV morphine decreased, oral oxy increased in preparation for d/c tomorrow.  Foot ulcer, left:    - Chronic ulcer.  Likely due to uncontrolled diabetes and diabetic neuropathy.  - MRI foot shows extensive cutaneous and subcutaneous edema without osteo.   - ABI mildly reduced on L - Wound care re-consulted.  Will need HHRN for wound care at home.   Diabetes Mellitus, Type 2:  - New diagnosis for pt.  Currently not on medication as outpatient.   - A1C 7.7.  Will need diabetes education. - Continue SSI.  Will start on Metformin when discharged.   - Will have pt f/u with PCP for long term management at discharge.  Alcohol dependence: Currently no signs of withdrawal.   Generalized Anxiety Disorder: Continue Klonopin, Lexapro, and Seroquel.        Iron Deficiency Anemia: - Fe <10, TIBC not calculated due to low Iron level, Ferritin 284  - Started PO Iron supplementation 12/2 with colace.  Tobacco Use: -Reports 1.5ppd -Discussed smoking cessation.  On Nicotine patch.    DVT Prophylaxis:  Heparin 5,000 units q8h Diet: Carb modified   Code Status: Full Family Communication: Pt is awake and alert Disposition Plan: pending PT evaluation  Consultants:  Orthopedic Surgery  Infectious Disease  Procedures:  None  Antibiotics:  IV Cefepime and Vancomycin 11/30>>  Rifampin 12/2 >>  Objective: Filed Vitals:   12/20/13 2120 12/21/13 0537 12/21/13 1132 12/21/13 1426  BP: 152/75 155/76 152/75 136/75  Pulse: 81 81 84 84  Temp: 99 F (37.2 C) 98 F (36.7 C)  98.9 F (37.2 C)  TempSrc: Oral Oral  Oral  Resp: 18 16  18   Weight:  SpO2: 97% 98%  99%    Intake/Output Summary (Last 24 hours) at 12/21/13 1617 Last data filed at 12/21/13 1425  Gross per 24 hour  Intake 3646.25 ml  Output   3300 ml  Net 346.25 ml   Exam: General: Overweight, NAD, appears stated age.  Sitting up in bed, smiling. HEENT:  Anicteic Sclera, MMM.  Cardiovascular: RRR, S1 S2 auscultated, no rubs, murmurs or gallops.   Respiratory: Clear to auscultation bilaterally with equal chest rise   Abdomen: Soft, nontender, nondistended, + bowel sounds  Extremities: Left lower leg has dressing and is wrapped.  Some erythema extending above dressing.  Right lower extremity- warm dry without cyanosis clubbing or edema.  Neuro: AAOx3,  Psych: Normal affect and demeanor with intact judgement and insight  Data Reviewed: Basic Metabolic Panel:  Recent Labs Lab 12/18/13 1030 12/18/13 1726 12/19/13 0416  NA 133*  --  136*  K 4.1  --  3.9  CL 95*  --  101  CO2 26  --  26  GLUCOSE 97  --  101*  BUN 5*  --  6  CREATININE 1.07 0.84 0.88  CALCIUM 9.0  --  8.2*   Liver Function Tests:  Recent Labs Lab 12/19/13 0416  AST 41*  ALT 35  ALKPHOS 87  BILITOT 0.3  PROT 7.0  ALBUMIN 1.8*   CBC:  Recent Labs Lab 12/18/13 1030 12/18/13 1726 12/19/13 0416 12/20/13 0626 12/21/13 0522  WBC 13.3* 10.5 7.5 6.8 6.1  NEUTROABS 10.0*  --   --   --   --   HGB 10.4* 9.9* 9.5* 8.7* 9.1*  HCT 32.4* 31.0* 30.0* 27.7* 29.0*  MCV 76.8* 76.9* 77.3* 76.5* 77.1*  PLT 462* 440* 393 382 382   CBG:  Recent Labs Lab 12/20/13 1227 12/20/13 1813 12/20/13 2217 12/21/13 0818 12/21/13 1216  GLUCAP 126* 104* 134* 99 175*    Recent Results (from the past 240 hour(s))  Blood Cultures x 2 sites     Status: None (Preliminary result)   Collection Time: 12/18/13  1:05 PM  Result Value Ref Range Status   Specimen Description BLOOD LEFT ANTECUBITAL  Final   Special Requests BOTTLES DRAWN AEROBIC AND ANAEROBIC 5CC  Final   Culture  Setup Time   Final    12/18/2013 19:20 Performed at [facility]    Culture   Final           BLOOD CULTURE RECEIVED NO GROWTH TO DATE CULTURE WILL BE HELD FOR 5 DAYS BEFORE ISSUING A FINAL NEGATIVE REPORT Performed at [facility]    Report Status PENDING  Incomplete  Blood Cultures x 2 sites     Status: None (Preliminary result)   Collection Time: 12/18/13  1:05 PM  Result Value Ref Range Status   Specimen Description BLOOD RIGHT HAND  Final    Special Requests BOTTLES DRAWN AEROBIC AND ANAEROBIC 5CC  Final   Culture  Setup Time   Final    12/18/2013 19:20 Performed at [facility]    Culture   Final           BLOOD CULTURE RECEIVED NO GROWTH TO DATE CULTURE WILL BE HELD FOR 5 DAYS BEFORE ISSUING A FINAL NEGATIVE REPORT Performed at [facility]    Report Status PENDING  Incomplete   Studies: No results found.  Scheduled Meds: . ceFEPime (MAXIPIME) IV  2 g Intravenous Q12H  . docusate sodium  100 mg Oral BID  . escitalopram  20 mg Oral q morning - 10a  . feeding supplement (PRO-STAT SUGAR FREE 64)  30 mL Oral BID WC  . ferrous sulfate  325 mg Oral BID WC  . folic acid  1 mg Oral Daily  . gabapentin  400 mg Oral QID  . heparin  5,000 Units Subcutaneous 3 times per day  . insulin aspart  0-5 Units Subcutaneous QHS  . living well with diabetes book   Does not apply Once  . methocarbamol  500 mg Oral TID  .  morphine injection  4 mg Intravenous Q6H  . multivitamin with minerals  1 tablet Oral Daily  . mupirocin cream   Topical Daily  . nicotine  14 mg Transdermal Daily  . pantoprazole  40 mg Oral Daily  . QUEtiapine  200 mg Oral QHS  . rifampin  300 mg Oral Daily  . sodium chloride  3 mL Intravenous Q12H  . thiamine  100 mg Oral Daily   Or  . thiamine  100 mg Intravenous Daily  . vancomycin  1,000 mg Intravenous Q8H   Continuous Infusions: . sodium chloride 75 mL/hr at 12/21/13 1552    Principal Problem:   Left leg cellulitis Active Problems:   Foot ulcer, left   Alcohol dependence   Generalized anxiety disorder   Type 2 diabetes mellitus with left diabetic foot ulcer   Diabetes mellitus due to underlying condition with foot ulcer   Chronic osteomyelitis of left tibia   Imogene Burn, PA-C  [facility] Pager [phone]. If 7PM-7AM, please contact night-coverage at [email or web address], password [facility] 12/21/2013, 4:17 PM  LOS: 3 days      Patient seen and examined, chart and data base  reviewed.  I agree with the above assessment and plan and have edited the above note. PICC line placed today, awaiting final recommendations from ID, tailor pain medications to oral agents and consider d/c home tomorrow.   For full details please see Mrs. Imogene Burn PA note.  Marzetta Board, MD [facility] [phone]

## 2013-12-22 LAB — GLUCOSE, CAPILLARY
GLUCOSE-CAPILLARY: 114 mg/dL — AB (ref 70–99)
GLUCOSE-CAPILLARY: 182 mg/dL — AB (ref 70–99)
Glucose-Capillary: 147 mg/dL — ABNORMAL HIGH (ref 70–99)

## 2013-12-22 MED ORDER — VANCOMYCIN HCL 10 G IV SOLR
1500.0000 mg | Freq: Two times a day (BID) | INTRAVENOUS | Status: DC
  Filled 2013-12-22: qty 1500

## 2013-12-22 MED ORDER — OXYCODONE HCL 5 MG PO TABS: 5.0000 mg | ORAL_TABLET | ORAL | Status: DC | PRN

## 2013-12-22 MED ORDER — PRO-STAT SUGAR FREE PO LIQD: 30.0000 mL | Freq: Two times a day (BID) | ORAL | Status: DC

## 2013-12-22 MED ORDER — FERROUS SULFATE 325 (65 FE) MG PO TABS: 325.0000 mg | ORAL_TABLET | Freq: Two times a day (BID) | ORAL | Status: DC

## 2013-12-22 MED ORDER — OXYCODONE-ACETAMINOPHEN 5-325 MG PO TABS: 2.0000 | ORAL_TABLET | ORAL | Status: DC | PRN

## 2013-12-22 MED ORDER — DSS 100 MG PO CAPS: 100.0000 mg | ORAL_CAPSULE | Freq: Two times a day (BID) | ORAL | Status: DC

## 2013-12-22 MED ORDER — RIFAMPIN 300 MG PO CAPS: 300.0000 mg | ORAL_CAPSULE | Freq: Every day | ORAL | Status: DC

## 2013-12-22 MED ORDER — VANCOMYCIN HCL IN DEXTROSE 1-5 GM/200ML-% IV SOLN: 1000.0000 mg | Freq: Three times a day (TID) | INTRAVENOUS | Status: AC

## 2013-12-22 MED ORDER — METFORMIN HCL 500 MG PO TABS
500.0000 mg | ORAL_TABLET | Freq: Two times a day (BID) | ORAL | Status: DC
Start: 2013-12-22 — End: 2014-01-15

## 2013-12-22 MED ORDER — PANTOPRAZOLE SODIUM 40 MG PO TBEC: 40.0000 mg | DELAYED_RELEASE_TABLET | Freq: Every day | ORAL | Status: DC

## 2013-12-22 MED ORDER — DEXTROSE 5 % IV SOLN: 2.0000 g | Freq: Two times a day (BID) | INTRAVENOUS | Status: AC

## 2013-12-22 NOTE — Discharge Instructions (Signed)
Home health will assist with wound care and IV medications.  Please Establish with a primary care physician in order to care for your wounds, Diabetes, and on going pain medications if you need them.

## 2013-12-22 NOTE — Plan of Care (Signed)
Problem: Consults Goal: Cellulitis Patient Education See Patient Education Module for education specifics.  Outcome: Completed/Met Date Met:  12/22/13 Goal: Skin Care Protocol Initiated - if Braden Score 18 or less If consults are not indicated, leave blank or document N/A  Outcome: Not Applicable Date Met:  67/01/41 Goal: Nutrition Consult-if indicated Outcome: Not Applicable Date Met:  03/19/29 Goal: Diabetes Guidelines if Diabetic/Glucose > 140 If diabetic or lab glucose is > 140 mg/dl - Initiate Diabetes/Hyperglycemia Guidelines & Document Interventions  Outcome: Completed/Met Date Met:  12/22/13  Problem: Phase III Progression Outcomes Goal: Activity at appropriate level-compared to baseline (UP IN CHAIR FOR HEMODIALYSIS)  Outcome: Completed/Met Date Met:  12/22/13 Goal: Temperature < 100 Outcome: Completed/Met Date Met:  12/22/13 Goal: Discharge plan remains appropriate-arrangements made Outcome: Completed/Met Date Met:  12/22/13 Goal: IV Meds changed to PO Outcome: Completed/Met Date Met:  12/22/13 Goal: Wound care performed by pt/family Outcome: Completed/Met Date Met:  12/22/13 Goal: Other Phase III Outcomes/Goals Outcome: Not Applicable Date Met:  43/88/87  Problem: Discharge Progression Outcomes Goal: Barriers To Progression Addressed/Resolved Outcome: Completed/Met Date Met:  12/22/13 Goal: Discharge plan in place and appropriate Outcome: Completed/Met Date Met:  12/22/13 Goal: Pain controlled with appropriate interventions Outcome: Completed/Met Date Met:  12/22/13 Goal: Hemodynamically stable Outcome: Completed/Met Date Met:  12/22/13 Goal: Tolerating diet Outcome: Completed/Met Date Met:  12/22/13 Goal: Activity appropriate for discharge plan Outcome: Completed/Met Date Met:  12/22/13 Goal: Home Health Care arrangements in place Outcome: Completed/Met Date Met:  12/22/13 Goal: Wound improving/decreased edema Outcome: Completed/Met Date Met:  12/22/13 Goal:  Patient verbalizes wound care regimen Outcome: Completed/Met Date Met:  12/22/13 Goal: Other Discharge Outcomes/Goals Outcome: Not Applicable Date Met:  57/97/28

## 2013-12-22 NOTE — Progress Notes (Signed)
Pt. Received discharge instructions and prescriptions. Educated pt. On follow-up appointments. Pt. Mom and dad are at bedside. IV nurse disconnected pt. From IV fluids and hep. Locked PICC line. Left leg is still wrapped with yesterday's dressing. Pt. Stated that he would wait until he got home for home health nurse to change. All questions answered. No further needs noted at this time.

## 2013-12-22 NOTE — Discharge Summary (Signed)
Physician Discharge Summary  [name] CHE:[id number] DOB: Mar 25, 1965 DOA: 12/18/2013  PCP: No PCP Per Patient  Admit date: 12/18/2013 Discharge date: 12/22/2013  Time spent: 50 minutes  Recommendations for Outpatient Follow-up:  1. Close follow up with Infectious Disease, Orthopedics, and wound care. 2. Home health RN for Picc care, IV antibiotics, and Wound care 3. Establish with a PCP for Diabetic management.  Started on metformin this admit. 4. Iron deficiency anemia.  Please monitor cbc and iron level.   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  He was previously hospitalized for 1 week for left lower extremity cellulitis and discharged 1 week prior to admission on clindamycin. Compliant with medication. Since discharge he reports severe pain in his left lower extremity with increased swelling, erythema, and weeping from wounds. Seen by wound care at home. Reports fever and chills with temperature of 100.4 last week. Seen in office by Dr. Sharol Given who advised him to return to the hospital for admission. 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  Hospital Course:  Cellulitis of left leg with possible chronic deep osteomyelitis of the left  tibia:  He was hospitalized approximately 2 weeks ago at [facility] for cellulitus of left lower extremity treated with IV abx for 7 days, followed by outpatient clindamycin. He improved while on IV antibiotics but got worse at home while on oral agents. After admission his MRI (as below) showed chronic osteo of the left tibia.  Appreciate Dr. Jess Barters input, no need for surgicial intervention at this time.  He will follow up with Dr. Sharol Given outpatient.  ID consulted and recommend 4 additional weeks of IV vancomycin, cefepime and oral rifampin.  The patient will also have follow up in the ID clinic as an outpatient.  PICC line was placed on 12/3. He will be prescribed oral percocet for pain.  Foot ulcer, left:  - Chronic ulcer. Likely due to uncontrolled diabetes and diabetic neuropathy.  - MRI foot shows extensive cutaneous and subcutaneous edema without osteo.  - ABI mildly reduced on Left - Will need HHRN for wound care at home.   Diabetes Mellitus, Type 2:  - According to patient this is a new diagnosis. He was not on medication as outpatient.  - A1C 7.7. Received extensive diabetic education as an inpatient.  Started on metformin 500 bid at discharge. - Will have pt f/u with PCP for long term management at discharge.  Alcohol dependence: Currently no signs of withdrawal.  Generalized Anxiety Disorder: Continue Klonopin, Lexapro, and Seroquel.   Iron Deficiency Anemia: - Fe <10, TIBC not calculated due to low Iron level, Ferritin 284  - Started PO Iron supplementation with colace.  Tobacco Use: -Reports 1.5 ppd -Discussed smoking cessation. Will discharge  on nicotine patch.   Procedures: PICC line placement  ABIs   Consultations:  Orthopedic Surgery  Infectious Disease   Discharge Exam: Filed Vitals:   12/21/13 1132 12/21/13 1426 12/21/13 2300 12/22/13 0700  BP: 152/75 136/75 155/75 153/81  Pulse: 84 84  56  Temp:  98.9 F (37.2 C) 98.9  F (37.2 C) 98.6 F (37 C)  TempSrc:  Oral Oral Oral  Resp:  18  18  Weight:      SpO2:  99%  95%   General: wd, wn male, NAD, appears stated age. Sitting up in bed, pleasant. HEENT: Anicteic Sclera, MMM.  Cardiovascular: RRR, S1 S2 auscultated, no rubs, murmurs or gallops.  Respiratory: Clear to auscultation bilaterally with equal chest rise  Abdomen: Soft, nontender, nondistended, + bowel sounds  Extremities: Left lower leg has dressing and is wrapped. Some erythema still extending above dressing. Decreased in comparison to the black initial tracing on his leg. Neuro: AAOx3,    Discharge Instructions   Discharge Instructions    Ambulatory referral to Nutrition and Diabetic Education    Complete by:  As directed   New diagnosis of DM per patient.  Patient with History of Borderline DM.  A1c 7.7%.     Diet - low sodium heart healthy    Complete by:  As directed      Increase activity slowly    Complete by:  As directed           Current Discharge Medication List    START taking these medications   Details  Amino Acids-Protein Hydrolys (FEEDING SUPPLEMENT, PRO-STAT SUGAR FREE 64,) LIQD Take 30 mLs by mouth 2 (two) times daily with a meal. Qty: 900 mL, Refills: 0    ceFEPIme 2 g in dextrose 5 % 50 mL Inject 2 g into the vein every 12 (twelve) hours. Qty: 112 g, Refills: 0    docusate sodium 100 MG CAPS Take 100 mg by mouth 2 (two) times daily. Qty: 10 capsule, Refills: 0    ferrous sulfate 325 (65 FE) MG tablet Take 1 tablet (325 mg total) by mouth 2 (two) times daily with a meal. Qty: 60 tablet, Refills: 3    metFORMIN (GLUCOPHAGE) 500 MG tablet Take 1 tablet (500 mg total) by mouth 2 (two) times daily with a meal. Qty: 60 tablet, Refills: 3    oxyCODONE (OXY IR/ROXICODONE) 5 MG immediate release tablet Take 1-2 tablets (5-10 mg total) by mouth every 4 (four) hours as needed for moderate pain. Qty: 60 tablet, Refills: 0    pantoprazole (PROTONIX) 40 MG  tablet Take 1 tablet (40 mg total) by mouth daily. Qty: 30 tablet, Refills: 0    rifampin (RIFADIN) 300 MG capsule Take 1 capsule (300 mg total) by mouth daily. Qty: 28 capsule, Refills: 0    vancomycin (VANCOCIN) 1 GM/200ML SOLN Inject 200 mLs (1,000 mg total) into the vein every 8 (eight) hours. Qty: 84000 mL, Refills: 0      CONTINUE these medications which have NOT CHANGED   Details  clonazePAM (KLONOPIN) 1 MG tablet Take 1 mg by mouth 4 (four) times daily.    escitalopram (LEXAPRO) 20 MG tablet Take 1 tablet (20 mg total) by mouth every morning. For depression Qty: 30 tablet, Refills: 0    gabapentin (NEURONTIN) 400 MG capsule Take 1 capsule (400 mg total) by mouth 4 (four) times daily. For substance withdrawal syndrome Qty: 120 capsule, Refills: 0    ibuprofen (ADVIL,MOTRIN) 200 MG tablet Take  400 mg by mouth every 6 (six) hours as needed for moderate pain.    mirtazapine (REMERON) 30 MG tablet Take 30 mg by mouth at bedtime.    QUEtiapine (SEROQUEL) 200 MG tablet Take 1 tablet (200 mg total) by mouth at bedtime. For mood control Qty: 30 tablet, Refills: 0    methocarbamol (ROBAXIN) 500 MG tablet Take 1 tablet (500 mg total) by mouth 3 (three) times daily. Qty: 21 tablet, Refills: 0    nicotine (NICODERM CQ - DOSED IN MG/24 HOURS) 21 mg/24hr patch Place 1 patch (21 mg total) onto the skin daily. Qty: 28 patch, Refills: 0    silver sulfADIAZINE (SILVADENE) 1 % cream Apply topically 2 (two) times daily. Qty: 50 g, Refills: 0      STOP taking these medications     potassium chloride SA (K-DUR,KLOR-CON) 20 MEQ tablet      cyclobenzaprine (FLEXERIL) 10 MG tablet      oxyCODONE-acetaminophen (PERCOCET) 5-325 MG per tablet      oxyCODONE-acetaminophen (PERCOCET) 5-325 MG per tablet      oxyCODONE-acetaminophen (PERCOCET/ROXICET) 5-325 MG per tablet        Allergies  Allergen Reactions  . Benadryl [Diphenhydramine Hcl] Other (See Comments)    Pt states leg spasm   . Trazodone And Nefazodone Other (See Comments)    Leg Spasms.    Follow-up Information    Follow up with Philemon Kingdom, MD On 01/02/2014.   Specialty:  Internal Medicine   Why:  8 am for follow up for diabetes   Contact information:   [address] [address] [phone]       Follow up with [name], MD In 1 week.   Specialty:  Orthopedic Surgery   Contact information:   [address] [place] [address] [phone]       Follow up with [facility]             . Schedule an appointment as soon as possible for a visit in 2 weeks.   Why:  Infectious Disease (Dr. Johnnye Sima).  Please call for a hospital follow up appt.   Contact information:   [place] [phone]        The results of significant diagnostics from this hospitalization (including imaging, microbiology, ancillary and laboratory) are listed below for reference.    Significant Diagnostic Studies: Dg Pelvis 1-2 Views  12/03/2013   CLINICAL DATA:  RIGHT knee gave out and fell on brick sidewalk tonight. Pain. History of knee replacement.  EXAM: PELVIS - 1-2 VIEW; RIGHT FEMUR - 2 VIEW  COMPARISON:  RIGHT femur radiographs October 04, 2013  FINDINGS: No acute fracture deformity. Status post RIGHT hip total arthroplasty with intact well seated hardware. No periprosthetic lucency. No destructive bony lesions. Soft tissue planes are nonsuspicious.  Status post RIGHT knee total arthroplasty, partially characterized.  IMPRESSION: No acute fracture deformity or dislocation.  Status post RIGHT hip total arthroplasty without radiographic findings of hardware failure.   Electronically Signed   By: Elon Alas   On: 12/03/2013 21:02   Dg Femur Right  12/03/2013   CLINICAL DATA:  RIGHT knee gave out and fell on brick sidewalk tonight. Pain. History of knee replacement.  EXAM: PELVIS - 1-2 VIEW; RIGHT FEMUR  - 2 VIEW  COMPARISON:  RIGHT femur radiographs October 04, 2013  FINDINGS: No acute fracture deformity. Status post RIGHT hip total arthroplasty  with intact well seated hardware. No periprosthetic lucency. No destructive bony lesions. Soft tissue planes are nonsuspicious.  Status post RIGHT knee total arthroplasty, partially characterized.  IMPRESSION: No acute fracture deformity or dislocation.  Status post RIGHT hip total arthroplasty without radiographic findings of hardware failure.   Electronically Signed   By: Elon Alas   On: 12/03/2013 21:02   Dg Ankle Complete Left  12/18/2013   CLINICAL DATA:  Diabetic foot.  Cellulitis  EXAM: LEFT ANKLE COMPLETE - 3+ VIEW  COMPARISON:  06/30/2013  FINDINGS: Diffuse soft tissue swelling has progressed. Joint effusion is noted. Joint space is normal.  Negative for fracture or osteomyelitis.  First metatarsal screw again noted.  IMPRESSION: Diffuse soft tissue swelling and joint effusion. Negative for fracture or osteomyelitis.   Electronically Signed   By: [name] Gallo M.D.   On: 12/18/2013 11:31   Mr Tibia Fibula Left Wo Contrast  12/19/2013   ADDENDUM REPORT: 12/19/2013 15:16  ADDENDUM: The original report was by Dr. Van Clines. The following addendum is by Dr. Van Clines:  In the first sentence under impressions, the word "could" should be stricken.   Electronically Signed   By: Sherryl Barters M.D.   On: 12/19/2013 15:16   12/19/2013   CLINICAL DATA:  Weeping wounds along the lower leg and foot. Plantar ulceration.  EXAM: MRI OF LOWER LEFT EXTREMITY WITHOUT CONTRAST  TECHNIQUE: Multiplanar, multisequence MR imaging of the left tibia/ fibula was performed. No intravenous contrast was administered.  COMPARISON:  12/18/2013  FINDINGS: The distal margins of bilateral knee implants are noted.  Speckled confluent distal tibial marrow edema signal noted with some marginal low T2 signal but not reflected with low T1 signal. There is some faint  linear and curvilinear increased T2 signal in the proximal tibial diaphysis on the left. I do not see an obvious a draining sinus tract or sequestrum.  There is diffuse subcutaneous edema in the left calf circumferentially, clearly asymmetric to the right side. High T1 signal in the left soleus and left gluteus medius muscles, favor fatty atrophy over hematoma given the streaky appearance.  No gas or obvious abscess in the soft tissues.  IMPRESSION: 1. Abnormal confluent edema signal in the distal could left tibial shaft, which terminates somewhat abruptly in the distal shaft. On conventional radiographs, there is some very faint sclerosis along this termination by without characteristic findings of bone infarct or enchondroma. I favor this is being due to a chronic osteomyelitis of the tibial shaft. 2. Diffuse asymmetric subcutaneous edema in the left calf, quite possibly representing cellulitis. 3. Asymmetric fatty atrophy of the left soleus and medial head left gastrocnemius muscles.  Electronically Signed: By: Sherryl Barters M.D. On: 12/18/2013 17:40   Mr Foot Left Wo Contrast  12/18/2013   CLINICAL DATA:  Diabetes. Foot ulcer. Worsening lower extremity pain and swelling. Cellulitis  EXAM: MRI OF THE LEFT FOREFOOT WITHOUT CONTRAST  TECHNIQUE: Multiplanar, multisequence MR imaging was performed. No intravenous contrast was administered.  COMPARISON:  None.  FINDINGS: On some of are images, we performed are osteomyelitis protocol to include the whole foot rather than just before foot. On other sequences, only the forefoot was included.  And metal screw observed at the first tarsometatarsal articulation. Prominent cutaneous and subcutaneous edema along the ankle and dorsal forefoot.  No significant abnormal osseous edema characteristic of osteomyelitis identified. Lisfranc joint alignment appears normal; the Lisfranc ligament, if intact, is obscured by the metal artifact from the adjacent first  tarsometatarsal joint screw.  I do not observe definite gas in the soft tissues. There may be some prominent blistering in the medial ankle, correlate with visual inspection.  IMPRESSION: 1. Extensive cutaneous and subcutaneous edema in the ankle and foot without osteomyelitis or drainable abscess identified. may be some blistering along the medial ankle. Please note that IV contrast was not administered, which can adversely affect assessment for small or abscesses. 2. Screw fixator at the first tarsometatarsal articulation.   Electronically Signed   By: Sherryl Barters M.D.   On: 12/18/2013 17:33   Dg Knee Complete 4 Views Right  12/03/2013   CLINICAL DATA:  RIGHT knee gave out and fell on brick sidewalk tonight. Pain. History of knee replacement.  EXAM: RIGHT KNEE - COMPLETE 4+ VIEW  COMPARISON:  RIGHT knee radiographs October 04, 2013  FINDINGS: Status post total knee arthroplasty with intact well seated femoral and tibial components. Expected appearance of the resurfaced patella. No periprosthetic lucency. No acute fracture deformity dislocation. No destructive bony lesions.Small similar suprapatellar joint effusion.  IMPRESSION: Status post RIGHT knee total arthroplasty without radiographic findings of hardware failure. Stable small suprapatellar joint effusion without acute fracture deformity or dislocation.   Electronically Signed   By: Elon Alas   On: 12/03/2013 21:04   Dg Foot Complete Left  12/18/2013   CLINICAL DATA:  Diabetic ulcer of foot, wound cellulitis  EXAM: LEFT FOOT - COMPLETE 3+ VIEW  COMPARISON:  10/04/2013  FINDINGS: Prior fusion of first TMT joint.  Diffuse osseous demineralization.  Scattered soft tissue swelling.  Remaining joint spaces preserved.  No acute fracture, dislocation, or bone destruction.  Specifically, no soft tissue gas or radiographic evidence of osteomyelitis identified.  IMPRESSION: Osseous demineralization with diffuse soft tissue swelling LEFT foot.   Post first TMT joint fusion.  No acute osseous abnormalities.   Electronically Signed   By: Lavonia Dana M.D.   On: 12/18/2013 11:32    Microbiology: Recent Results (from the past 240 hour(s))  Blood Cultures x 2 sites     Status: None (Preliminary result)   Collection Time: 12/18/13  1:05 PM  Result Value Ref Range Status   Specimen Description BLOOD LEFT ANTECUBITAL  Final   Special Requests BOTTLES DRAWN AEROBIC AND ANAEROBIC 5CC  Final   Culture  Setup Time   Final    12/18/2013 19:20 Performed at [facility]    Culture   Final           BLOOD CULTURE RECEIVED NO GROWTH TO DATE CULTURE WILL BE HELD FOR 5 DAYS BEFORE ISSUING A FINAL NEGATIVE REPORT Performed at [facility]    Report Status PENDING  Incomplete  Blood Cultures x 2 sites     Status: None (Preliminary result)   Collection Time: 12/18/13  1:05 PM  Result Value Ref Range Status   Specimen Description BLOOD RIGHT HAND  Final   Special Requests BOTTLES DRAWN AEROBIC AND ANAEROBIC 5CC  Final   Culture  Setup Time   Final    12/18/2013 19:20 Performed at [facility]    Culture   Final           BLOOD CULTURE RECEIVED NO GROWTH TO DATE CULTURE WILL BE HELD FOR 5 DAYS BEFORE ISSUING A FINAL NEGATIVE REPORT Performed at [facility]    Report Status PENDING  Incomplete     Labs: Basic Metabolic Panel:  Recent Labs Lab 12/18/13 1030 12/18/13 1726 12/19/13 0416  NA 133*  --  136*  K 4.1  --  3.9  CL 95*  --  101  CO2 26  --  26  GLUCOSE 97  --  101*  BUN 5*  --  6  CREATININE 1.07 0.84 0.88  CALCIUM 9.0  --  8.2*   Liver Function Tests:  Recent Labs Lab 12/19/13 0416  AST 41*  ALT 35  ALKPHOS 87  BILITOT 0.3  PROT 7.0  ALBUMIN 1.8*   CBC:  Recent Labs Lab 12/18/13 1030 12/18/13 1726 12/19/13 0416 12/20/13 0626 12/21/13 0522  WBC 13.3* 10.5 7.5 6.8 6.1  NEUTROABS 10.0*  --   --   --   --   HGB 10.4* 9.9* 9.5* 8.7* 9.1*  HCT 32.4* 31.0* 30.0* 27.7* 29.0*   MCV 76.8* 76.9* 77.3* 76.5* 77.1*  PLT 462* 440* 393 382 382   CBG:  Recent Labs Lab 12/21/13 0818 12/21/13 1216 12/21/13 1650 12/21/13 2137 12/22/13 0737  GLUCAP 99 175* 102* 104* 114*       Signed:  Melton Alar, PA-C  [facility] 12/22/2013, 9:37 AM      Patient seen and examined, chart and data base reviewed.  I agree with the above assessment and plan and have edited the above note. Patient admitted with severe left lower extremity cellulitis and an MRI positive for chronic osteomyelitis. Orthopedic surgery was consulted about this patient, recommended against surgical management at this point. Patient was started on broad-spectrum IV antibiotics with vancomycin, cefepime and he will need about 4 weeks of continued IV antibiotics per ID. PICC line was placed on 12/21/2013, patient will be discharged home with home health services.  For full details please see Mrs. Imogene Burn PA note.  Marzetta Board, MD [facility] [phone]

## 2013-12-22 NOTE — Progress Notes (Signed)
12/22/13 [place], BSN [phone] Patient is for dc today, NCM informed [facility] of this information, patient will dc with HHRN for iv abx and wound care.

## 2013-12-22 NOTE — Progress Notes (Signed)
ANTIBIOTIC CONSULT NOTE - FOLLOW UP  Pharmacy Consult: Vancomycin Indication: Diabetic Foot Infection, Chronic L Tibial Osteo, Severe Cellulitis  Dosing Weight: 91 kg  Afebrile  98.9 F (37.2 C) (Oral)    Lab Results  Component Value Date   WBC 6.8 12/20/2013    Labs:  Recent Labs  12/20/13 0626 12/21/13 0522  WBC 6.8 6.1  HGB 8.7* 9.1*  PLT 382 382    Estimated Creatinine Clearance: 120 mL/min (by C-G formula based on Cr of 0.88).   Recent Labs  12/20/13 1555  VANCOTROUGH 20.8*     Microbiology: Recent Results (from the past 720 hour(s))  Blood Cultures x 2 sites     Status: None (Preliminary result)   Collection Time: 12/18/13  1:05 PM  Result Value Ref Range Status   Specimen Description BLOOD LEFT ANTECUBITAL  Final   Special Requests BOTTLES DRAWN AEROBIC AND ANAEROBIC 5CC  Final   Culture  Setup Time   Final    12/18/2013 19:20 Performed at [facility]    Culture   Final           BLOOD CULTURE RECEIVED NO GROWTH TO DATE CULTURE WILL BE HELD FOR 5 DAYS BEFORE ISSUING A FINAL NEGATIVE REPORT Performed at [facility]    Report Status PENDING  Incomplete  Blood Cultures x 2 sites     Status: None (Preliminary result)   Collection Time: 12/18/13  1:05 PM  Result Value Ref Range Status   Specimen Description BLOOD RIGHT HAND  Final   Special Requests BOTTLES DRAWN AEROBIC AND ANAEROBIC 5CC  Final   Culture  Setup Time   Final    12/18/2013 19:20 Performed at [facility]    Culture   Final           BLOOD CULTURE RECEIVED NO GROWTH TO DATE CULTURE WILL BE HELD FOR 5 DAYS BEFORE ISSUING A FINAL NEGATIVE REPORT Performed at [facility]    Report Status PENDING  Incomplete    Current Medication[s] Include: Scheduled:  Scheduled:  . ceFEPime (MAXIPIME) IV  2 g Intravenous Q12H  . docusate sodium  100 mg Oral BID  . escitalopram  20 mg Oral q morning - 10a  . feeding supplement (PRO-STAT SUGAR FREE 64)  30 mL Oral  BID WC  . ferrous sulfate  325 mg Oral BID WC  . folic acid  1 mg Oral Daily  . gabapentin  400 mg Oral QID  . heparin  5,000 Units Subcutaneous 3 times per day  . insulin aspart  0-5 Units Subcutaneous QHS  . living well with diabetes book   Does not apply Once  . methocarbamol  500 mg Oral TID  .  morphine injection  4 mg Intravenous Q6H  . multivitamin with minerals  1 tablet Oral Daily  . mupirocin cream   Topical Daily  . nicotine  14 mg Transdermal Daily  . pantoprazole  40 mg Oral Daily  . QUEtiapine  200 mg Oral QHS  . rifampin  300 mg Oral Daily  . sodium chloride  3 mL Intravenous Q12H  . thiamine  100 mg Oral Daily   Or  . thiamine  100 mg Intravenous Daily  . vancomycin  1,000 mg Intravenous Q8H    Infusion[s]: Infusions:  . sodium chloride 75 mL/hr at 12/21/13 2151    Antibiotic[s]: Anti-infectives    Start     Dose/Rate Route Frequency Ordered Stop   12/22/13 0000  ceFEPIme 2 g in dextrose 5 % 50 mL     2 g100 mL/hr over 30 Minutes Intravenous Every 12 hours 12/22/13 0934 01/19/14 2359   12/22/13 0000  rifampin (RIFADIN) 300 MG capsule     300 mg Oral Daily 12/22/13 0934     12/22/13 0000  vancomycin (VANCOCIN) 1 GM/200ML SOLN     1,000 mg200 mL/hr over 60 Minutes Intravenous Every 8 hours 12/22/13 0934 01/19/14 2359   12/20/13 1800  rifampin (RIFADIN) capsule 300 mg     300 mg Oral Daily 12/20/13 1630     12/19/13 0100  vancomycin (VANCOCIN) IVPB 1000 mg/200 mL premix     1,000 mg200 mL/hr over 60 Minutes Intravenous Every 8 hours 12/18/13 1509     12/18/13 1545  vancomycin (VANCOCIN) IVPB 1000 mg/200 mL premix     1,000 mg200 mL/hr over 60 Minutes Intravenous  Once 12/18/13 1509 12/18/13 1944   12/18/13 1530  ceFEPIme (MAXIPIME) 2 g in dextrose 5 % 50 mL IVPB     2 g100 mL/hr over 30 Minutes Intravenous Every 12 hours 12/18/13 1421     12/18/13 1400  metroNIDAZOLE (FLAGYL) tablet 500 mg  Status:  Discontinued     500 mg Oral 3 times per day 12/18/13 1224  12/18/13 1421   12/18/13 1230  cefTRIAXone (ROCEPHIN) 2 g in dextrose 5 % 50 mL IVPB  Status:  Discontinued     2 g100 mL/hr over 30 Minutes Intravenous Every 24 hours 12/18/13 1224 12/18/13 1421     Assessment:  48 y/o male on Vancomycin, Cefepime for Diabetic Foot Infection, Chronic L Tibial Osteo, Severe Cellulitis.  ID consulted and added Rifampin to antibiotics.  Temp 100.7,  WBC trending down.  CrCl > 100.  Vancomycin trough level [drawn early] 20.8 mcg/ml.  Goal of Therapy:   Vancomycin trough level 15-20 mcg/ml  Monitor renal function, WBC, fever curve, any cultures/sensitivities, and clinical progression.  Plan:  Patient to be discharged home today on IV abx for 4 weeks.with Cefepime 2 gm IV q12h and will change Vancomycin 1500mg  IV q12h . Goal troughs 15-20 for osteo. Monitor renal function, WBC, fever curve, any cultures/sensitivities, and clinical progression. Follow up Vancomycin levels as clinically indicated. Suggest next level on 12/9 or earlier if warranted.  Isac Sarna, BS Pharm D, BCPS Clinical Pharmacist .   12/22/2013,1:43 PM

## 2013-12-24 LAB — CULTURE, BLOOD (ROUTINE X 2)
CULTURE: NO GROWTH
Culture: NO GROWTH

## 2013-12-27 ENCOUNTER — Telehealth: Payer: Self-pay | Admitting: Internal Medicine

## 2013-12-27 NOTE — Telephone Encounter (Signed)
Called pt and advised him that Dr Cruzita Lederer does not prescribe pain medications. He will have to get a PCP to prescribe/refill this. Pt said ok.

## 2013-12-27 NOTE — Telephone Encounter (Signed)
Patient need refill of of percocet 5-325 fax to [id number] - [place], [place]

## 2014-01-01 ENCOUNTER — Emergency Department (HOSPITAL_COMMUNITY)
Admission: EM | Admit: 2014-01-01 | Discharge: 2014-01-01 | Disposition: A | Discharge status: Home / Self Care | Payer: Medicare Other | Attending: Emergency Medicine | Admitting: Emergency Medicine

## 2014-01-01 ENCOUNTER — Encounter (HOSPITAL_COMMUNITY): Payer: Self-pay | Admitting: Emergency Medicine

## 2014-01-01 DIAGNOSIS — Z79899 Other long term (current) drug therapy: Secondary | ICD-10-CM | POA: Diagnosis not present

## 2014-01-01 DIAGNOSIS — F329 Major depressive disorder, single episode, unspecified: Secondary | ICD-10-CM | POA: Insufficient documentation

## 2014-01-01 DIAGNOSIS — Z720 Tobacco use: Secondary | ICD-10-CM | POA: Diagnosis not present

## 2014-01-01 DIAGNOSIS — Z872 Personal history of diseases of the skin and subcutaneous tissue: Secondary | ICD-10-CM | POA: Insufficient documentation

## 2014-01-01 DIAGNOSIS — M79672 Pain in left foot: Secondary | ICD-10-CM | POA: Diagnosis present

## 2014-01-01 DIAGNOSIS — E119 Type 2 diabetes mellitus without complications: Secondary | ICD-10-CM | POA: Diagnosis not present

## 2014-01-01 DIAGNOSIS — K219 Gastro-esophageal reflux disease without esophagitis: Secondary | ICD-10-CM | POA: Diagnosis not present

## 2014-01-01 DIAGNOSIS — Z8701 Personal history of pneumonia (recurrent): Secondary | ICD-10-CM | POA: Diagnosis not present

## 2014-01-01 DIAGNOSIS — Z8673 Personal history of transient ischemic attack (TIA), and cerebral infarction without residual deficits: Secondary | ICD-10-CM | POA: Diagnosis not present

## 2014-01-01 DIAGNOSIS — M1990 Unspecified osteoarthritis, unspecified site: Secondary | ICD-10-CM | POA: Insufficient documentation

## 2014-01-01 DIAGNOSIS — Z792 Long term (current) use of antibiotics: Secondary | ICD-10-CM | POA: Diagnosis not present

## 2014-01-01 DIAGNOSIS — M79605 Pain in left leg: Secondary | ICD-10-CM

## 2014-01-01 DIAGNOSIS — F419 Anxiety disorder, unspecified: Secondary | ICD-10-CM | POA: Insufficient documentation

## 2014-01-01 IMAGING — CR DG KNEE COMPLETE 4+V*R*
4 series · 4 of 4 positions shown · non-contrast
Comparison: 07/09/2010

CLINICAL DATA: Knee pain and ankle pain

RIGHT KNEE - COMPLETE 4+ VIEW

[view not recorded (1 of 4)]
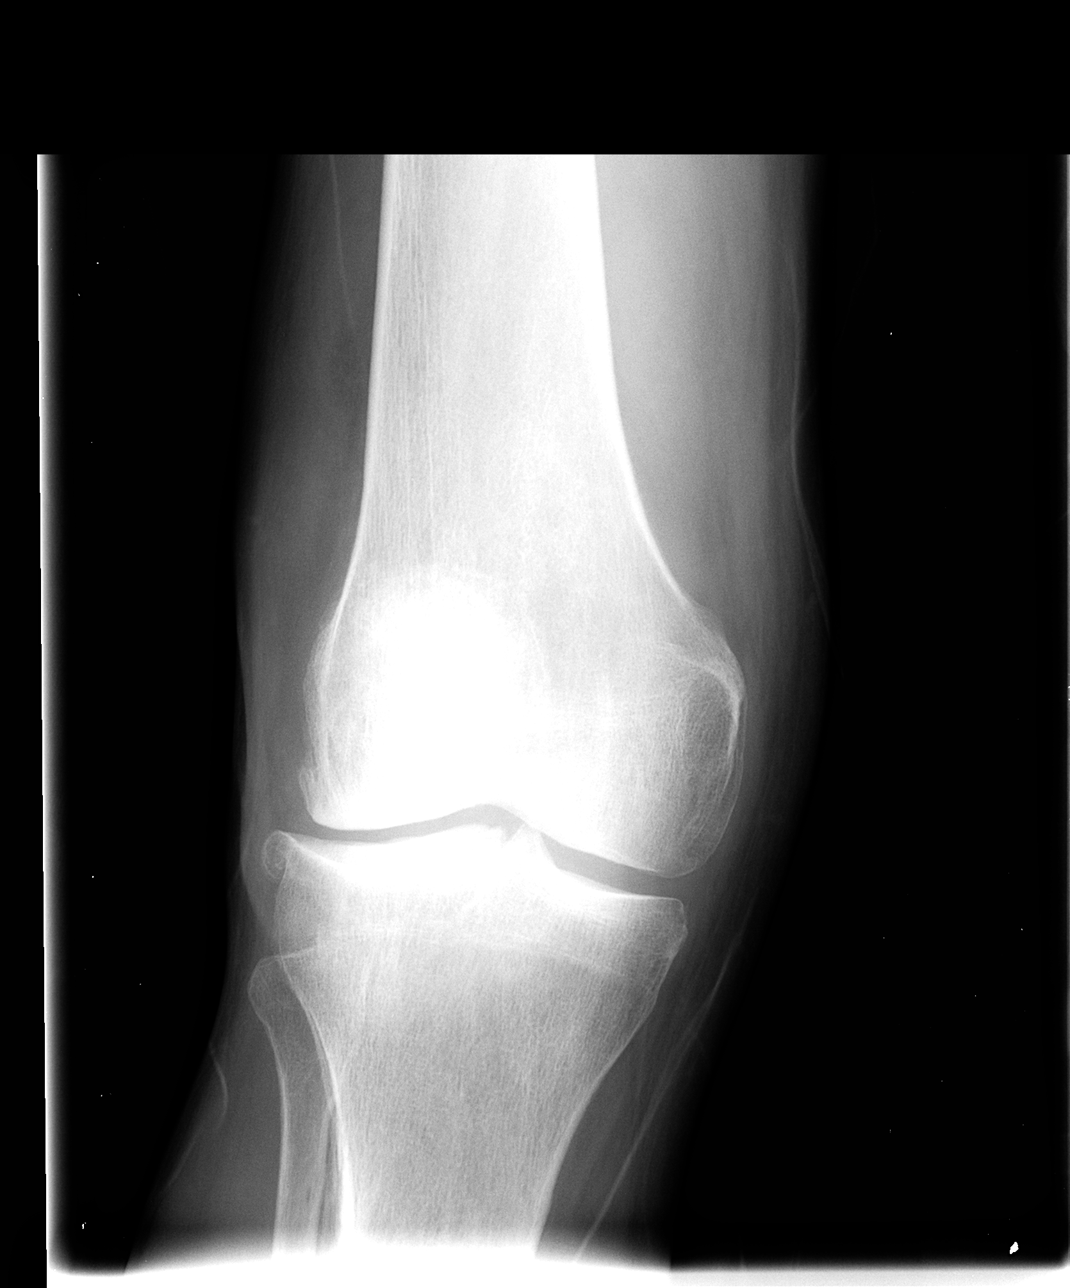

[view not recorded (2 of 4)]
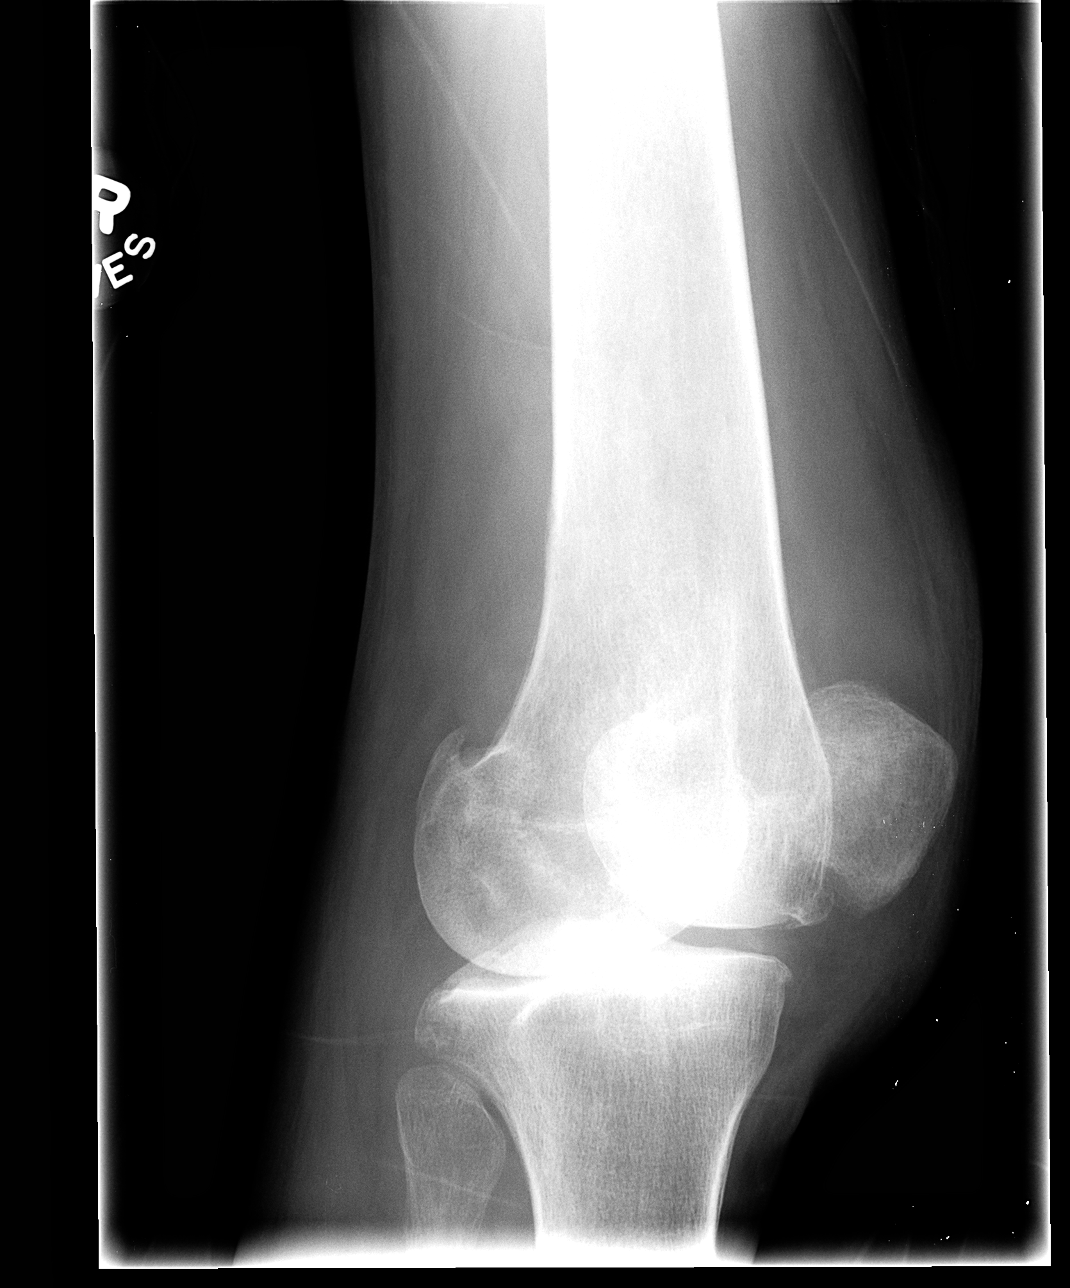

[view not recorded (3 of 4)]
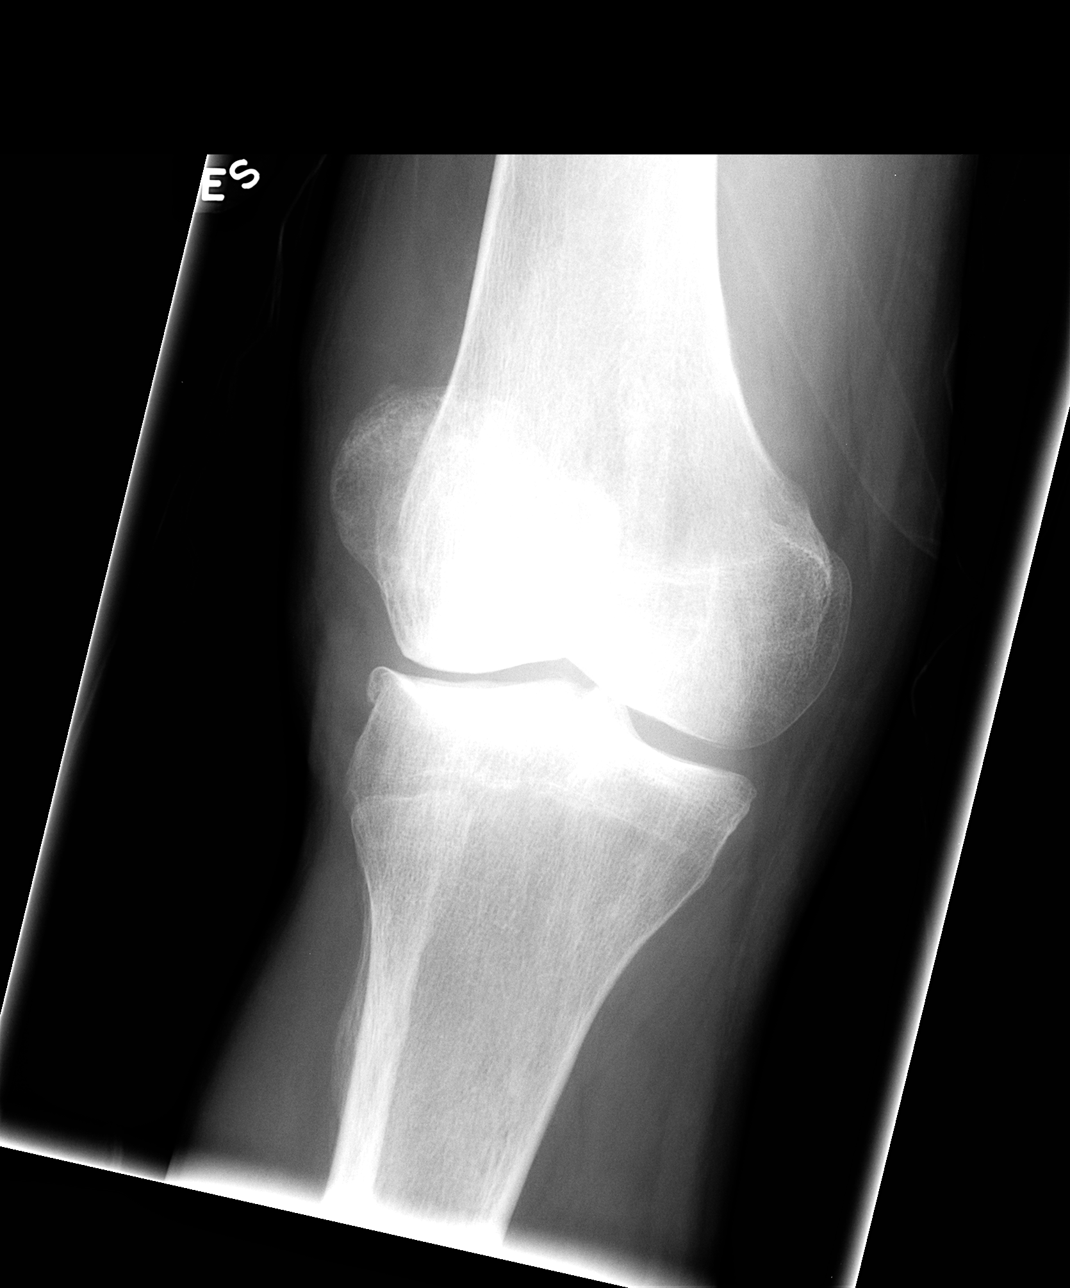

[view not recorded (4 of 4)]
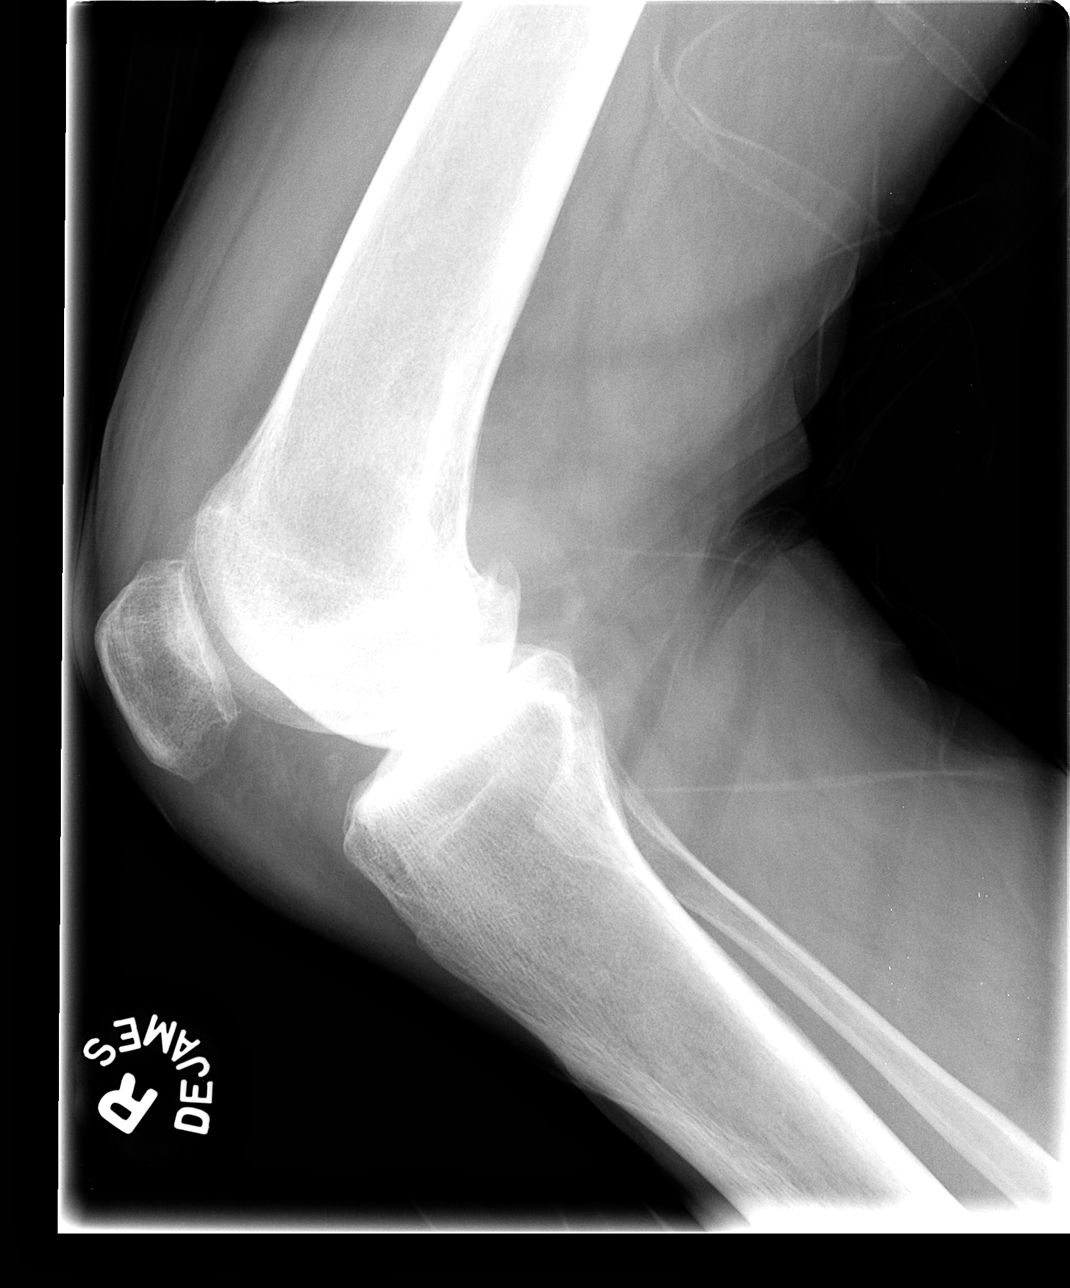

[4 of 4 positions shown; findings below may reference images not displayed]

FINDINGS: Very small suprapatellar joint effusion is present.

There is moderate tricompartment osteoarthritis with joint space
narrowing, subchondral sclerosis, sharpening of the tibial spines
and marginal spur formation.

No fracture or subluxation identified.
IMPRESSION: 1.  Moderate tricompartment osteoarthritis.
2.  Small joint effusion.

## 2014-01-01 IMAGING — CR DG ANKLE COMPLETE 3+V*L*
3 series · 3 of 3 positions shown · non-contrast
Comparison: None

CLINICAL DATA: Ankle pain

LEFT ANKLE COMPLETE - 3+ VIEW

[view not recorded (1 of 3)]
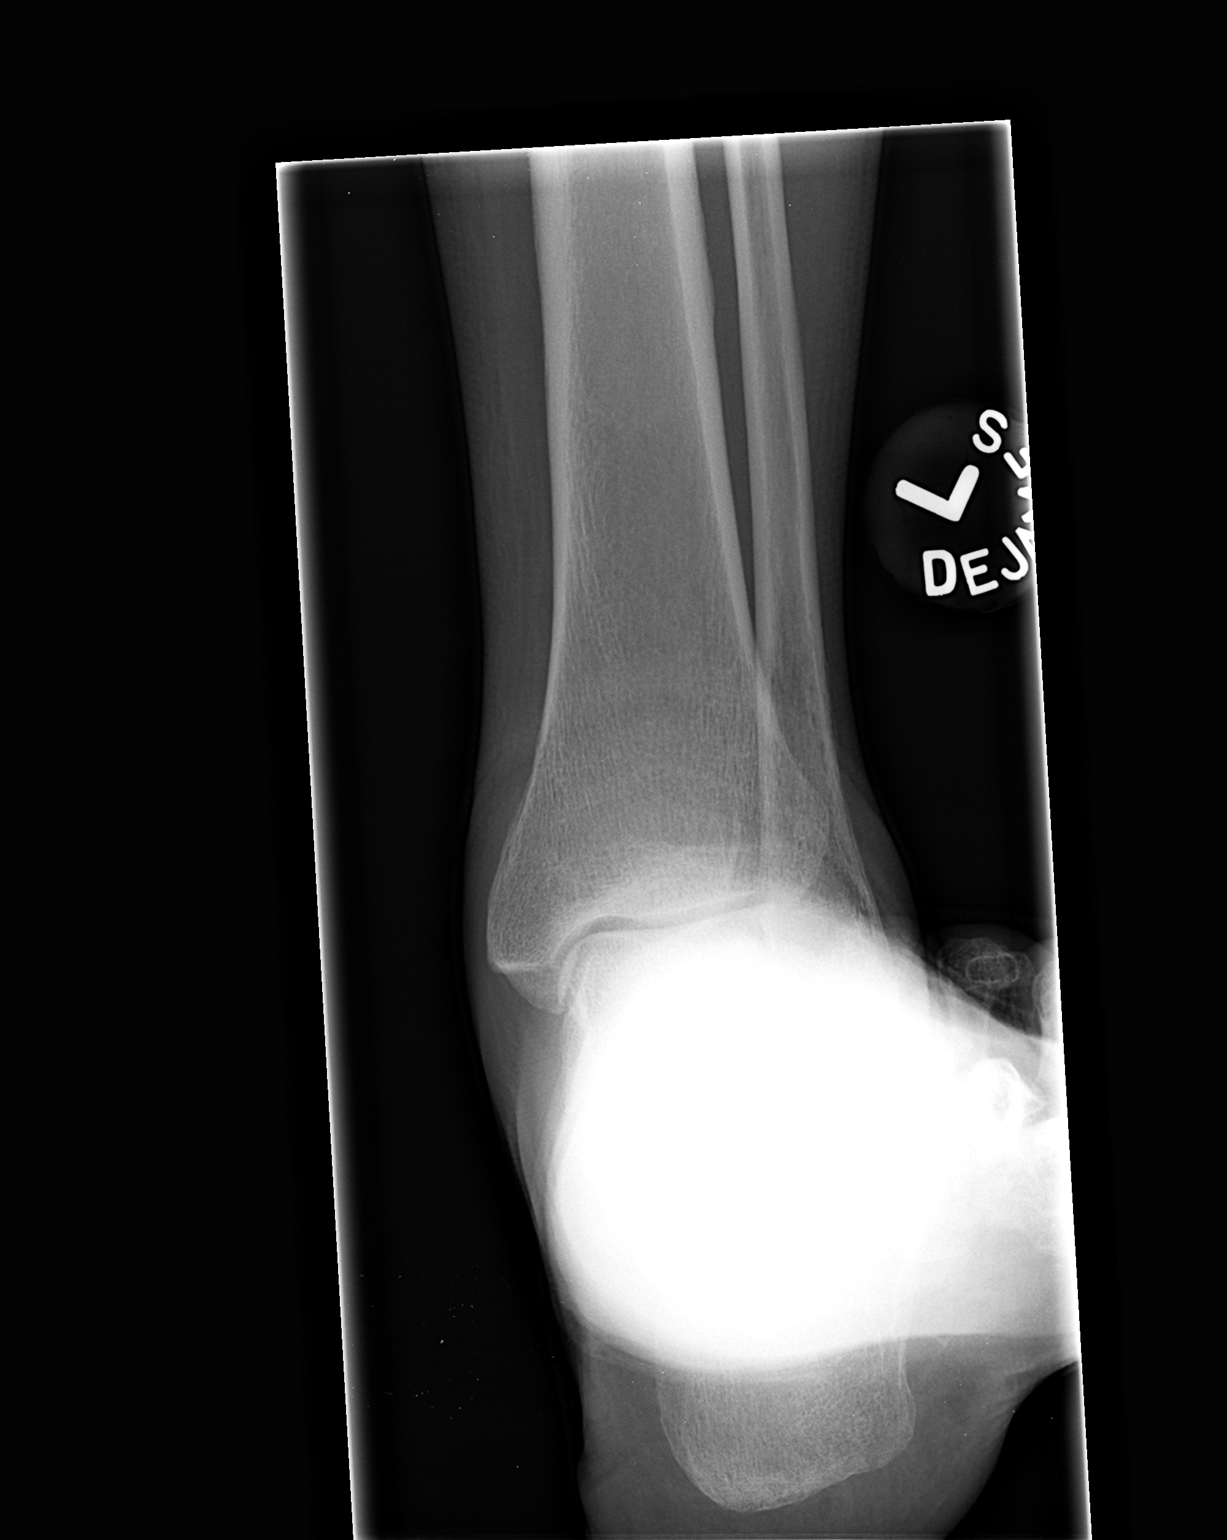

[view not recorded (2 of 3)]
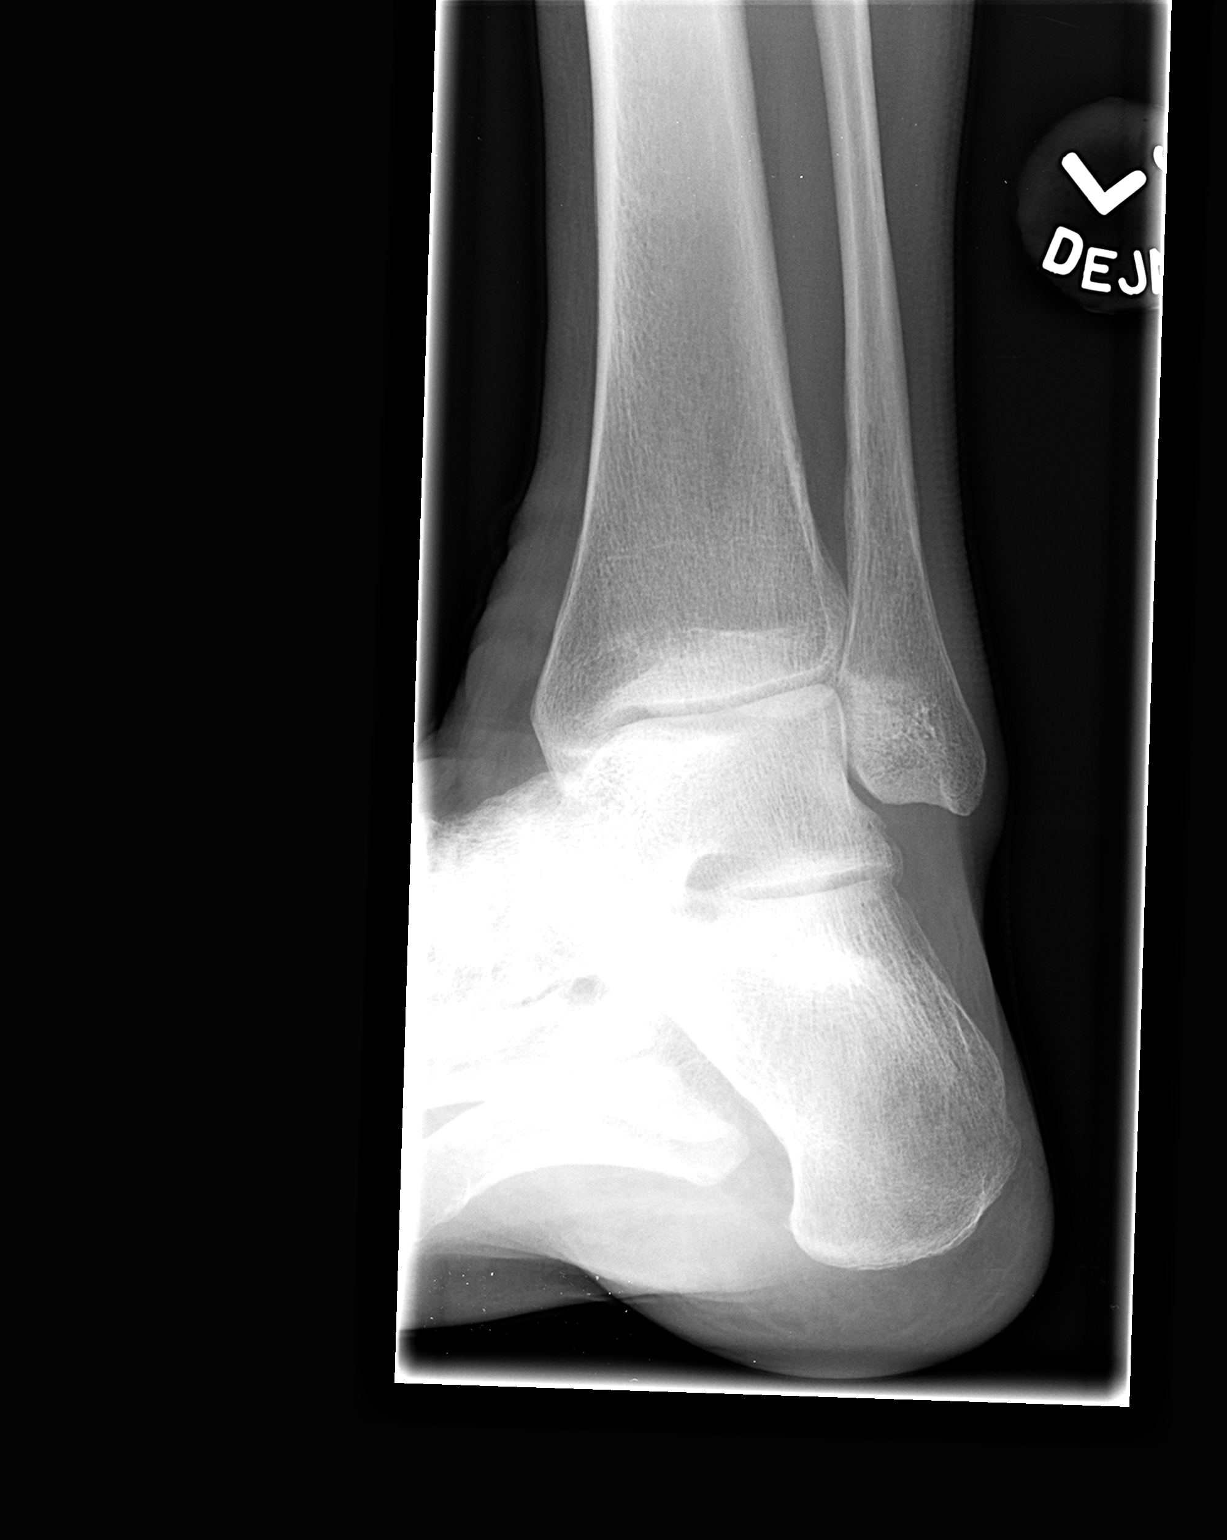

[view not recorded (3 of 3)]
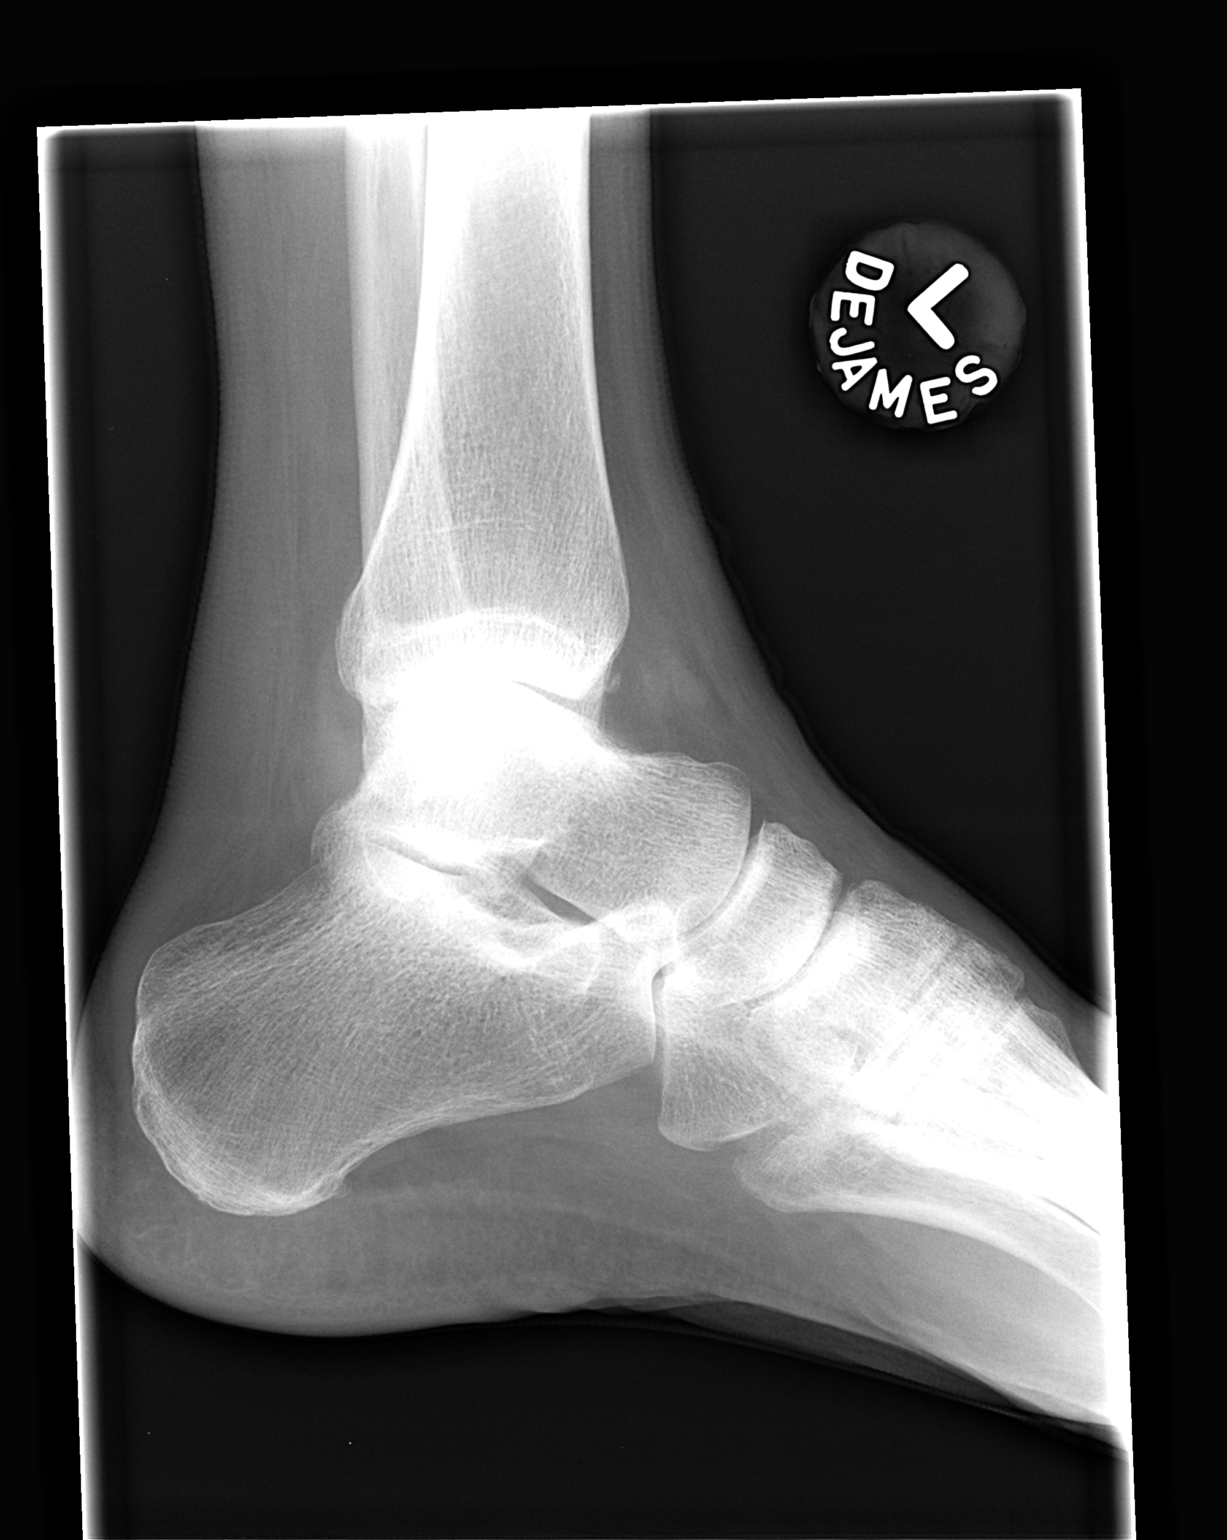

[3 of 3 positions shown; findings below may reference images not displayed]

FINDINGS: There is no evidence of fracture or dislocation.  There
is no evidence of arthropathy or other focal bone abnormality.
Soft tissues are unremarkable.
IMPRESSION: Negative examination.

## 2014-01-01 MED ORDER — OXYCODONE-ACETAMINOPHEN 5-325 MG PO TABS: 1.0000 | ORAL_TABLET | Freq: Four times a day (QID) | ORAL | Status: DC | PRN

## 2014-01-01 MED ORDER — OXYCODONE-ACETAMINOPHEN 5-325 MG PO TABS
2.0000 | ORAL_TABLET | Freq: Once | ORAL | Status: AC
  Administered 2014-01-01: 2 via ORAL
  Filled 2014-01-01: qty 2

## 2014-01-01 NOTE — ED Provider Notes (Signed)
CSN: [id number]     Arrival date & time 01/01/14  1742 History   First MD Initiated Contact with Patient 01/01/14 2110     Chief Complaint  Patient presents with  . Foot Pain     Patient is a 48 y.o. male presenting with lower extremity pain. The history is provided by the patient.  Foot Pain This is a recurrent problem. The current episode started more than 1 week ago. The problem occurs daily. The problem has not changed since onset.Pertinent negatives include no chest pain and no shortness of breath. Exacerbated by: movement. The symptoms are relieved by narcotics.  patient with recent significant cellulitis to his left LE He was admitted, it improved but he required home IV antibiotics He reports that he has had persistent pain in his left foot since hospital discharge He reports has been out of pain meds for 2 days  He denies any fever/chills/cp/sob He feels the infection in his left leg is improving There is no vomiting He does report recent diarrhea due to antibiotics    Past Medical History  Diagnosis Date  . Diabetic foot ulcer   . ETOH abuse   . Anxiety   . Open wound     bottom of foot  . Diabetes mellitus without complication     borderline  . Neuromuscular disorder     neuropathy  . GERD (gastroesophageal reflux disease)     tums  . Pneumonia ~ 2012  . History of blood transfusion     "related to left knee OR; probably right hip too" (04/21/2012)  . Stroke 2008    "they said I might have had one during right hip replacement" (04/21/2012)  . Arthritis     "everywhere" (04/21/2012)  . Mental disorder   . Depression   . DDD (degenerative disc disease)   . Cellulitis and abscess of foot 12/19/2013    LEFT FOOT   Past Surgical History  Procedure Laterality Date  . Total hip arthroplasty Right 2008  . Total knee arthroplasty Left 2006  . Joint replacement    . Lung lobectomy Left ~ 2006  . Revision total hip arthroplasty Right 2008    "4-5 months after  replacement" (04/21/2012)  . Knee arthroscopy Bilateral 1980's/1990's  . Lung lobectomy    . Metatarsal osteotomy  10/29/2011    Procedure: METATARSAL OSTEOTOMY;  Surgeon: [name], MD;  Location: [facility];  Service: Orthopedics;  Laterality: Left;  Left 1st Metatarsal Dorsal Closing Wedge   . Total knee arthroplasty Right 04/20/2012  . Total knee arthroplasty Right 04/20/2012    Procedure: TOTAL KNEE ARTHROPLASTY;  Surgeon: [name], MD;  Location: [facility];  Service: Orthopedics;  Laterality: Right;  Right Total Knee Arthroplasty   Family History  Problem Relation Age of Onset  . Diabetes Father    History  Substance Use Topics  . Smoking status: Current Every Day Smoker -- 1.00 packs/day for 30 years    Types: Cigarettes  . Smokeless tobacco: Never Used     Comment: 04/21/2012 offered smoking cessation materials; pt declines  12/19/2013 DECLINES SMOKING CESSATION MATERIALS  . Alcohol Use: No     Comment: last used march 11    Review of Systems  Constitutional: Negative for fever.  Respiratory: Negative for shortness of breath.   Cardiovascular: Negative for chest pain.  Skin: Positive for wound.      Allergies  Benadryl and Trazodone and nefazodone  Home Medications   Prior to  Admission medications   Medication Sig Start Date End Date Taking? Authorizing Provider  ceFEPIme 2 g in dextrose 5 % 50 mL Inject 2 g into the vein every 12 (twelve) hours. 12/22/13 01/19/14 Yes Marianne L York, PA-C  clonazePAM (KLONOPIN) 1 MG tablet Take 1 mg by mouth 4 (four) times daily.   Yes Historical Provider, MD  escitalopram (LEXAPRO) 20 MG tablet Take 1 tablet (20 mg total) by mouth every morning. For depression 03/29/13  Yes Encarnacion Slates, NP  ferrous sulfate 325 (65 FE) MG tablet Take 1 tablet (325 mg total) by mouth 2 (two) times daily with a meal. 12/22/13  Yes Bobby Rumpf York, PA-C  gabapentin (NEURONTIN) 400 MG capsule Take 1 capsule (400 mg total) by mouth 4 (four) times daily. For  substance withdrawal syndrome 03/29/13  Yes Encarnacion Slates, NP  metFORMIN (GLUCOPHAGE) 500 MG tablet Take 1 tablet (500 mg total) by mouth 2 (two) times daily with a meal. 12/22/13  Yes Bobby Rumpf York, PA-C  mirtazapine (REMERON) 30 MG tablet Take 30 mg by mouth at bedtime.   Yes Historical Provider, MD  pantoprazole (PROTONIX) 40 MG tablet Take 1 tablet (40 mg total) by mouth daily. 12/22/13  Yes Marianne L York, PA-C  QUEtiapine (SEROQUEL) 200 MG tablet Take 1 tablet (200 mg total) by mouth at bedtime. For mood control 03/29/13  Yes Encarnacion Slates, NP  rifampin (RIFADIN) 300 MG capsule Take 1 capsule (300 mg total) by mouth daily. 12/22/13  Yes Bobby Rumpf York, PA-C  vancomycin (VANCOCIN) 1 GM/200ML SOLN Inject 200 mLs (1,000 mg total) into the vein every 8 (eight) hours. 12/22/13 01/19/14 Yes Marianne L York, PA-C  Amino Acids-Protein Hydrolys (FEEDING SUPPLEMENT, PRO-STAT SUGAR FREE 64,) LIQD Take 30 mLs by mouth 2 (two) times daily with a meal. 12/22/13   Bobby Rumpf York, PA-C  ibuprofen (ADVIL,MOTRIN) 200 MG tablet Take 400 mg by mouth every 6 (six) hours as needed for moderate pain.    Historical Provider, MD  oxyCODONE-acetaminophen (PERCOCET/ROXICET) 5-325 MG per tablet Take 1 tablet by mouth every 6 (six) hours as needed for severe pain. 01/01/14   Sharyon Cable, MD   BP 145/88 mmHg  Pulse 96  Temp(Src) 98.1 F (36.7 C) (Oral)  Resp 20  Ht 6\' 3"  (1.905 m)  Wt 200 lb (90.719 kg)  BMI 25.00 kg/m2  SpO2 99% Physical Exam CONSTITUTIONAL: Well developed/well nourished HEAD: Normocephalic/atraumatic ENMT: Mucous membranes moist NECK: supple no meningeal signs CV: S1/S2 noted, no murmurs/rubs/gallops noted LUNGS: Lungs are clear to auscultation bilaterally, no apparent distress ABDOMEN: soft, nontender, no rebound or guarding, bowel sounds noted throughout abdomen NEURO: Pt is awake/alert/appropriate, moves all extremitiesx4.  No facial droop.   EXTREMITIES: pulses normal/equal, full  ROM PICC line in right UE, nontender to palpation and no surrounding erythema Left LE - he has erythema from left calf distally but no drainage.  No crepitus.  He has healing calloues to his feet.  There is no erythema extending past the knee.   SKIN: warm, color normal PSYCH: no abnormalities of mood noted, alert and oriented to situation  ED Course  Procedures    Medications  oxyCODONE-acetaminophen (PERCOCET/ROXICET) 5-325 MG per tablet 2 tablet (2 tablets Oral Given 01/01/14 2119)   Pt feels infection is improving and when reviewing previous ED notes it appears the infection has improved He is well appearing/afebrile, however this does appear to be a painful process and he is without pain meds Short course  of percocet prescribed, as he reports he has followup later this week    MDM   Final diagnoses:  Lower extremity pain, left    Nursing notes including past medical history and social history reviewed and considered in documentation Previous records reviewed and considered     Sharyon Cable, MD 01/01/14 2148

## 2014-01-01 NOTE — ED Notes (Signed)
Pt was released from [facility] about 1.5 weeks ago for left foot infection requiring the placement of a picc line and IV antibiotics. Pt c/o left foot pain. Reports he is out of the pain medication he was prescribed.

## 2014-01-01 NOTE — ED Notes (Signed)
Pt has followup appointment on Friday.

## 2014-01-01 NOTE — ED Notes (Signed)
Pt alert & oriented x4, stable gait. Patient given discharge instructions, paperwork & prescription(s). Patient  instructed to stop at the registration desk to finish any additional paperwork. Patient verbalized understanding. Pt left department w/ no further questions. 

## 2014-01-02 ENCOUNTER — Ambulatory Visit: Payer: Self-pay | Admitting: Internal Medicine

## 2014-01-03 ENCOUNTER — Encounter: Payer: Medicare Other | Admitting: Nutrition

## 2014-01-15 ENCOUNTER — Encounter: Payer: Self-pay | Admitting: Internal Medicine

## 2014-01-15 ENCOUNTER — Ambulatory Visit (INDEPENDENT_AMBULATORY_CARE_PROVIDER_SITE_OTHER): Payer: Medicare Other | Admitting: Internal Medicine

## 2014-01-15 VITALS — BP 124/70 | HR 86 | Temp 97.7°F | Resp 12 | Ht 74.0 in | Wt 201.8 lb

## 2014-01-15 DIAGNOSIS — E11621 Type 2 diabetes mellitus with foot ulcer: Secondary | ICD-10-CM

## 2014-01-15 DIAGNOSIS — L97529 Non-pressure chronic ulcer of other part of left foot with unspecified severity: Secondary | ICD-10-CM

## 2014-01-15 LAB — MICROALBUMIN / CREATININE URINE RATIO
CREATININE, U: 159.6 mg/dL
MICROALB/CREAT RATIO: 3.2 mg/g (ref 0.0–30.0)
Microalb, Ur: 5.1 mg/dL — ABNORMAL HIGH (ref 0.0–1.9)

## 2014-01-15 MED ORDER — METFORMIN HCL 500 MG PO TABS: 500.0000 mg | ORAL_TABLET | Freq: Two times a day (BID) | ORAL | Status: DC

## 2014-01-15 NOTE — Progress Notes (Signed)
Patient ID: [name], male   DOB: Jan 09, 1966, 48 y.o.   MRN: [id number]  HPI: [name] is a 48 y.o.-year-old male, referred by Dr. Marzetta Palmer ([facility]), for management of DM2, dx in 2015, non-insulin-dependent, uncontrolled, with complications (foot ulcer). He does not have a PCP.  He was admitted with a foot ulcer (has had the ulcer for 4 years) beginning of 12/2013 >> on ABx through PICC line (Vanc, Cefepime) and also Rifampin po. He had a MRI to R/o osteomyelitis - reviewed report: 1. Extensive cutaneous and subcutaneous edema in the ankle and foot without osteomyelitis or drainable abscess identified. may be some blistering along the medial ankle. Please note that IV contrast was not administered, which can adversely affect assessment for small or abscesses. 2. Screw fixator at the first tarsometatarsal articulation.  Last hemoglobin A1c was: Lab Results  Component Value Date   HGBA1C 7.7* 12/18/2013   HGBA1C 5.4 08/05/2011   HGBA1C 5.7* 02/09/2011   Pt is on a regimen of: - Metformin 500 mg po bid - started 4 weeks ago He has diarrhea most morning >> unclear if from Vancomycin.  Pt checks his sugars 2x a day and they are: - am: 80-120 - 2h after b'fast: n/c - before lunch: n/c - 2h after lunch: n/c - before dinner: n/c - 2h after dinner: 115-120 - bedtime: n/c - nighttime: n/c No lows. Lowest sugar was 58; he has hypoglycemia awareness at 60-70.  Highest sugar was 120  Pt's meals are: - Breakfast: bacon + eggs; cereals - Lunch: boloney sandwich - Dinner: chilli; subway; TV dinner - Snacks: pork rinds, PB, grapes He quit soft drinks after his recent discharged.  He lost 25 lbs since his infection b/c has no appetite  - no CKD, last BUN/creatinine:  Lab Results  Component Value Date   BUN 6 12/19/2013   CREATININE 0.88 12/19/2013   - last set of lipids: No results found for: CHOL, HDL, LDLCALC, LDLDIRECT, TRIG, CHOLHDL - last eye exam was 2  years ago. No DR.  - + numbness and tingling in his feet. On Neurontin 400 mg 4x a day.  Pt has FH of DM in father - dx at 15 y/o.  ROS: Constitutional: + weight loss, + decreased appetite, + fatigue, no subjective hyperthermia/hypothermia, + nocturia Eyes: no blurry vision, no xerophthalmia ENT: no sore throat, no nodules palpated in throat, no dysphagia/odynophagia, no hoarseness Cardiovascular: no CP/SOB/palpitations/+ L leg swelling Respiratory: no cough/SOB Gastrointestinal: no N/V/D/C Musculoskeletal: + all: muscle/joint aches Skin: + rash - L foot Neurological: no tremors/numbness/tingling/dizziness Psychiatric: no depression/anxiety + diff with erections  Past Medical History  Diagnosis Date  . Diabetic foot ulcer   . ETOH abuse   . Anxiety   . Open wound     bottom of foot  . Diabetes mellitus without complication     borderline  . Neuromuscular disorder     neuropathy  . GERD (gastroesophageal reflux disease)     tums  . Pneumonia ~ 2012  . History of blood transfusion     "related to left knee OR; probably right hip too" (04/21/2012)  . Stroke 2008    "they said I might have had one during right hip replacement" (04/21/2012)  . Arthritis     "everywhere" (04/21/2012)  . Mental disorder   . Depression   . DDD (degenerative disc disease)   . Cellulitis and abscess of foot 12/19/2013    LEFT FOOT   Past Surgical History  Procedure Laterality Date  . Total hip arthroplasty Right 2008  . Total knee arthroplasty Left 2006  . Joint replacement    . Lung lobectomy Left ~ 2006  . Revision total hip arthroplasty Right 2008    "4-5 months after replacement" (04/21/2012)  . Knee arthroscopy Bilateral 1980's/1990's  . Lung lobectomy    . Metatarsal osteotomy  10/29/2011    Procedure: METATARSAL OSTEOTOMY;  Surgeon: [name], MD;  Location: [facility];  Service: Orthopedics;  Laterality: Left;  Left 1st Metatarsal Dorsal Closing Wedge   . Total knee arthroplasty Right  04/20/2012  . Total knee arthroplasty Right 04/20/2012    Procedure: TOTAL KNEE ARTHROPLASTY;  Surgeon: [name], MD;  Location: [facility];  Service: Orthopedics;  Laterality: Right;  Right Total Knee Arthroplasty   History   Social History  . Marital Status: Divorced    Spouse Name: N/A    Number of Children: 1   Occupational History  . disabled   Social History Main Topics  . Smoking status: Current Every Day Smoker -- 1.00 packs/day for 30 years    Types: Cigarettes  . Smokeless tobacco: Never Used     Comment: 04/21/2012 offered smoking cessation materials; pt declines  12/19/2013 DECLINES SMOKING CESSATION MATERIALS  . Alcohol Use: No     Comment: last used march 11  . Drug Use: Yes    Special: Marijuana, Cocaine     Comment: denies use 11/21/13   Current Outpatient Prescriptions on File Prior to Visit  Medication Sig Dispense Refill  . Amino Acids-Protein Hydrolys (FEEDING SUPPLEMENT, PRO-STAT SUGAR FREE 64,) LIQD Take 30 mLs by mouth 2 (two) times daily with a meal. 900 mL 0  . ceFEPIme 2 g in dextrose 5 % 50 mL Inject 2 g into the vein every 12 (twelve) hours. 112 g 0  . clonazePAM (KLONOPIN) 1 MG tablet Take 1 mg by mouth 4 (four) times daily.    Marland Kitchen escitalopram (LEXAPRO) 20 MG tablet Take 1 tablet (20 mg total) by mouth every morning. For depression 30 tablet 0  . ferrous sulfate 325 (65 FE) MG tablet Take 1 tablet (325 mg total) by mouth 2 (two) times daily with a meal. 60 tablet 3  . gabapentin (NEURONTIN) 400 MG capsule Take 1 capsule (400 mg total) by mouth 4 (four) times daily. For substance withdrawal syndrome 120 capsule 0  . metFORMIN (GLUCOPHAGE) 500 MG tablet Take 1 tablet (500 mg total) by mouth 2 (two) times daily with a meal. 60 tablet 3  . mirtazapine (REMERON) 30 MG tablet Take 30 mg by mouth at bedtime.    Marland Kitchen oxyCODONE-acetaminophen (PERCOCET/ROXICET) 5-325 MG per tablet Take 1 tablet by mouth every 6 (six) hours as needed for severe pain. 15 tablet 0  .  QUEtiapine (SEROQUEL) 200 MG tablet Take 1 tablet (200 mg total) by mouth at bedtime. For mood control 30 tablet 0  . rifampin (RIFADIN) 300 MG capsule Take 1 capsule (300 mg total) by mouth daily. 28 capsule 0  . vancomycin (VANCOCIN) 1 GM/200ML SOLN Inject 200 mLs (1,000 mg total) into the vein every 8 (eight) hours. 84000 mL 0  . ibuprofen (ADVIL,MOTRIN) 200 MG tablet Take 400 mg by mouth every 6 (six) hours as needed for moderate pain.    . pantoprazole (PROTONIX) 40 MG tablet Take 1 tablet (40 mg total) by mouth daily. (Patient not taking: Reported on 01/15/2014) 30 tablet 0   No current facility-administered medications on file prior to  visit.   Allergies  Allergen Reactions  . Benadryl [Diphenhydramine Hcl] Other (See Comments)    Pt states leg spasm  . Trazodone And Nefazodone Other (See Comments)    Leg Spasms.    Family History  Problem Relation Age of Onset  . Diabetes Father    PE: BP 124/70 mmHg  Pulse 86  Temp(Src) 97.7 F (36.5 C) (Oral)  Resp 12  Ht 6\' 2"  (1.88 m)  Wt 201 lb 12.8 oz (91.536 kg)  BMI 25.90 kg/m2  SpO2 98% Wt Readings from Last 3 Encounters:  01/15/14 201 lb 12.8 oz (91.536 kg)  01/01/14 200 lb (90.719 kg)  12/22/13 199 lb 11.8 oz (90.6 kg)   Constitutional: overweight, in NAD Eyes: PERRLA, EOMI, no exophthalmos ENT: moist mucous membranes, no thyromegaly, no cervical lymphadenopathy Cardiovascular: RRR, No MRG Respiratory: CTA B Gastrointestinal: abdomen soft, NT, ND, BS+ Musculoskeletal: no deformities, strength intact in all 4 Skin: moist, warm - L foor with excessive calluses on heel. The midfoot ulcer is healed. There is extensive erythema up to the knee. There is swelling around left ankle. Neurological: no tremor with outstretched hands, DTR normal in all 4  ASSESSMENT: 1. DM2, non-insulin-dependent, controlled, without complications  2. L foot ulcer and cellulitis  PLAN:  1. Patient with newly dx'ed, controlled, diabetes, on  oral antidiabetic regimen (metformin submaximal dose), with good results >> sugars 80-120. He had 1 low CBG >> skipped a meal. His sugars are possibly better also b/c clearing his foot infection. - We discussed about options for treatment, and I suggested to:  Patient Instructions  Please continue: - Metformin 500 mg 2x a day, with meals Please return in 3 months with your sugar log.  - continue checking sugars at different times of the day - check 1-2 times a day, rotating checks - given sugar log and advised how to fill it and to bring it at next appt  - given foot care handout and explained the principles  - given instructions for hypoglycemia management "15-15 rule"  - advised for yearly eye exams >> needs one - will check ACR today  - given diet instructions  - Return to clinic in 3 mo with sugar log   2. L foot ulcer and cellulitis - On Vanc and Cefepime - he is asking for narcotics, but I am not comfortable Rx'ing them for him. Reviewed chart >> UDS positive for cocaine this summer.  He is on Neurontin. - he will see Dr. Johnnye Sima (ID) next week - I advised him he needs a PCP  Component     Latest Ref Rng 01/15/2014  Microalb, Ur     0.0 - 1.9 mg/dL 5.1 (H)  Creatinine,U      159.6  MICROALB/CREAT RATIO     0.0 - 30.0 mg/g 3.2  Normal ACR.

## 2014-01-15 NOTE — Patient Instructions (Signed)
Please continue: - Metformin 500 mg 2x a day, with meals  Please return in 3 months with your sugar log.   PATIENT INSTRUCTIONS FOR TYPE 2 DIABETES:  DIET AND EXERCISE Diet and exercise is an important part of diabetic treatment.  We recommended aerobic exercise in the form of brisk walking (working between 40-60% of maximal aerobic capacity, similar to brisk walking) for 150 minutes per week (such as 30 minutes five days per week) along with 3 times per week performing 'resistance' training (using various gauge rubber tubes with handles) 5-10 exercises involving the major muscle groups (upper body, lower body and core) performing 10-15 repetitions (or near fatigue) each exercise. Start at half the above goal but build slowly to reach the above goals. If limited by weight, joint pain, or disability, we recommend daily walking in a swimming pool with water up to waist to reduce pressure from joints while allow for adequate exercise.    BLOOD GLUCOSES Monitoring your blood glucoses is important for continued management of your diabetes. Please check your blood glucoses 2-4 times a day: fasting, before meals and at bedtime (you can rotate these measurements - e.g. one day check before the 3 meals, the next day check before 2 of the meals and before bedtime, etc.).   HYPOGLYCEMIA (low blood sugar) Hypoglycemia is usually a reaction to not eating, exercising, or taking too much insulin/ other diabetes drugs.  Symptoms include tremors, sweating, hunger, confusion, headache, etc. Treat IMMEDIATELY with 15 grams of Carbs: . 4 glucose tablets .  cup regular juice/soda . 2 tablespoons raisins . 4 teaspoons sugar . 1 tablespoon honey Recheck blood glucose in 15 mins and repeat above if still symptomatic/blood glucose <100.  RECOMMENDATIONS TO REDUCE YOUR RISK OF DIABETIC COMPLICATIONS: * Take your prescribed MEDICATION(S) * Follow a DIABETIC diet: Complex carbs, fiber rich foods, (monounsaturated  and polyunsaturated) fats * AVOID saturated/trans fats, high fat foods, >2,300 mg salt per day. * EXERCISE at least 5 times a week for 30 minutes or preferably daily.  * DO NOT SMOKE OR DRINK more than 1 drink a day. * Check your FEET every day. Do not wear tightfitting shoes. Contact us if you develop an ulcer * See your EYE doctor once a year or more if needed * Get a FLU shot once a year * Get a PNEUMONIA vaccine once before and once after age 77 years  GOALS:  * Your Hemoglobin A1c of <7%  * fasting sugars need to be <130 * after meals sugars need to be <180 (2h after you start eating) * Your Systolic BP should be 161 or lower  * Your Diastolic BP should be 80 or lower  * Your HDL (Good Cholesterol) should be 40 or higher  * Your LDL (Bad Cholesterol) should be 100 or lower. * Your Triglycerides should be 150 or lower  * Your Urine microalbumin (kidney function) should be <30 * Your Body Mass Index should be 25 or lower    Please consider the following ways to cut down carbs and fat and increase fiber and micronutrients in your diet: - substitute whole grain for white bread or pasta - substitute brown rice for white rice - substitute 90-calorie flat bread pieces for slices of bread when possible - substitute sweet potatoes or yams for white potatoes - substitute humus for margarine - substitute tofu for cheese when possible - substitute almond or rice milk for regular milk (would not drink soy milk daily due to concern  for soy estrogen influence on breast cancer risk) - substitute dark chocolate for other sweets when possible - substitute water - can add lemon or orange slices for taste - for diet sodas (artificial sweeteners will trick your body that you can eat sweets without getting calories and will lead you to overeating and weight gain in the long run) - do not skip breakfast or other meals (this will slow down the metabolism and will result in more weight gain over time)   - can try smoothies made from fruit and almond/rice milk in am instead of regular breakfast - can also try old-fashioned (not instant) oatmeal made with almond/rice milk in am - order the dressing on the side when eating salad at a restaurant (pour less than half of the dressing on the salad) - eat as little meat as possible - can try juicing, but should not forget that juicing will get rid of the fiber, so would alternate with eating raw veg./fruits or drinking smoothies - use as little oil as possible, even when using olive oil - can dress a salad with a mix of balsamic vinegar and lemon juice, for e.g. - use agave nectar, stevia sugar, or regular sugar rather than artificial sweateners - steam or broil/roast veggies  - snack on veggies/fruit/nuts (unsalted, preferably) when possible, rather than processed foods - reduce or eliminate aspartame in diet (it is in diet sodas, chewing gum, etc) Read the labels!  Try to read Dr. Janene Harvey book: "Program for Reversing Diabetes" for other ideas for healthy eating.

## 2014-01-16 ENCOUNTER — Encounter: Payer: Self-pay | Admitting: Infectious Diseases

## 2014-01-23 ENCOUNTER — Telehealth: Payer: Self-pay | Admitting: *Deleted

## 2014-01-23 ENCOUNTER — Encounter: Payer: Self-pay | Admitting: Infectious Diseases

## 2014-01-23 ENCOUNTER — Ambulatory Visit (INDEPENDENT_AMBULATORY_CARE_PROVIDER_SITE_OTHER): Payer: Medicare Other | Admitting: Infectious Diseases

## 2014-01-23 VITALS — BP 164/88 | HR 63 | Temp 97.8°F | Wt 196.0 lb

## 2014-01-23 DIAGNOSIS — M86662 Other chronic osteomyelitis, left tibia and fibula: Secondary | ICD-10-CM

## 2014-01-23 DIAGNOSIS — L97529 Non-pressure chronic ulcer of other part of left foot with unspecified severity: Secondary | ICD-10-CM

## 2014-01-23 DIAGNOSIS — E11621 Type 2 diabetes mellitus with foot ulcer: Secondary | ICD-10-CM

## 2014-01-23 NOTE — Telephone Encounter (Signed)
Verbal order per Dr. Johnnye Sima given to [facility] at [place] to pull patient's picc line on 01/25/14. Myrtis Hopping

## 2014-01-23 NOTE — Telephone Encounter (Signed)
[facility] at [place] notified per Dr. Johnnye Sima to hold vancomycin dose today and repeat level tomorrow. Page MD before restarting Vanc. Level 25.4 Myrtis Hopping

## 2014-01-23 NOTE — Assessment & Plan Note (Signed)
His ESR and CRP have come down. Will repeat his MRI due to skin changes and his continued pain.  He needs ortho f/u.  Will see him back in 2 weeks.

## 2014-01-23 NOTE — Assessment & Plan Note (Signed)
Will try to get him into PCP

## 2014-01-23 NOTE — Progress Notes (Signed)
   Subjective:    Patient ID: [name], male    DOB: [birth year], 49 y.o.   MRN: [id number]  HPI 49 yo M with newly dx DM2 (pre-DM for 4 years) and chronic L foot ulcer (x 4 years) as well as ETOH abuse. He had a foot ulcer on L ~ 4 years ago, he underwent screw placement and it eventually healed. It eventually returned.  He was seen mid-November at [facility] for LLE cellulitis and d/c home with po clinda. Since coming home, he had worsening erythema and swelling (up to his thigh) as well as weeping from his wounds. He  had temp 100.4 at home and was seen in ortho office, sent to ED.  He was started on ceftriaxone/flagyl, plain films negative for osteo. He was then changed to vanco/cefepime. Had MRI showing: 1. Extensive cutaneous and subcutaneous edema in the ankle and foot without osteomyelitis or drainable abscess identified. may be some blistering along the medial ankle. Please note that IV contrast was not administered, which can adversely affect assessment for small or abscesses. 2. Screw fixator at the first tarsometatarsal articulation. 1. Abnormal confluent edema signal in the distal could left tibial shaft, which terminates somewhat abruptly in the distal shaft. On conventional radiographs, there is some very faint sclerosis along this termination by without characteristic findings of bone infarct or enchondroma. I favor this is being due to a chronic osteomyelitis of the tibial shaft. 2. Diffuse asymmetric subcutaneous edema in the left calf, quite possibly representing cellulitis. 3. Asymmetric fatty atrophy of the left soleus and medial head left gastrocnemius muscles.  He was treated with cefepime/vanco/rifampin in hospital. He did not undergo any surgical procedures. He was d/c homo n 12-4 with plan for 23 more days of IV anbx.  States he has 2 more days of anbx at this point.  Feels like swelling in ihs L ankle is better. No fevers or chills.  No problems with PIC.  Would  like refill of his pain rx.  Has non PCP.   Review of Systems  Constitutional: Negative for fever and chills.  Gastrointestinal: Negative for diarrhea and constipation.  Genitourinary: Negative for difficulty urinating.       Objective:   Physical Exam  Constitutional: He appears well-developed and well-nourished.  Musculoskeletal:       Feet:          Assessment & Plan:

## 2014-01-24 ENCOUNTER — Encounter: Payer: Self-pay | Admitting: *Deleted

## 2014-01-30 ENCOUNTER — Encounter (HOSPITAL_COMMUNITY): Payer: Self-pay | Admitting: Cardiology

## 2014-01-30 ENCOUNTER — Emergency Department (HOSPITAL_COMMUNITY)
Admission: EM | Admit: 2014-01-30 | Discharge: 2014-01-30 | Disposition: A | Discharge status: Home / Self Care | Payer: Medicare Other | Attending: Emergency Medicine | Admitting: Emergency Medicine

## 2014-01-30 ENCOUNTER — Emergency Department (HOSPITAL_COMMUNITY): Payer: Medicare Other

## 2014-01-30 ENCOUNTER — Ambulatory Visit (HOSPITAL_COMMUNITY)
Admission: RE | Admit: 2014-01-30 | Discharge: 2014-01-30 | Disposition: A | Discharge status: Home / Self Care | Payer: Medicare Other | Source: Ambulatory Visit | Attending: Infectious Diseases | Admitting: Infectious Diseases

## 2014-01-30 DIAGNOSIS — Z79899 Other long term (current) drug therapy: Secondary | ICD-10-CM | POA: Diagnosis not present

## 2014-01-30 DIAGNOSIS — G709 Myoneural disorder, unspecified: Secondary | ICD-10-CM | POA: Insufficient documentation

## 2014-01-30 DIAGNOSIS — Y998 Other external cause status: Secondary | ICD-10-CM | POA: Insufficient documentation

## 2014-01-30 DIAGNOSIS — Y9289 Other specified places as the place of occurrence of the external cause: Secondary | ICD-10-CM | POA: Insufficient documentation

## 2014-01-30 DIAGNOSIS — L97509 Non-pressure chronic ulcer of other part of unspecified foot with unspecified severity: Secondary | ICD-10-CM | POA: Diagnosis not present

## 2014-01-30 DIAGNOSIS — S99912A Unspecified injury of left ankle, initial encounter: Secondary | ICD-10-CM | POA: Insufficient documentation

## 2014-01-30 DIAGNOSIS — M25572 Pain in left ankle and joints of left foot: Secondary | ICD-10-CM | POA: Insufficient documentation

## 2014-01-30 DIAGNOSIS — Z8673 Personal history of transient ischemic attack (TIA), and cerebral infarction without residual deficits: Secondary | ICD-10-CM | POA: Insufficient documentation

## 2014-01-30 DIAGNOSIS — F419 Anxiety disorder, unspecified: Secondary | ICD-10-CM | POA: Insufficient documentation

## 2014-01-30 DIAGNOSIS — Z792 Long term (current) use of antibiotics: Secondary | ICD-10-CM | POA: Insufficient documentation

## 2014-01-30 DIAGNOSIS — M86662 Other chronic osteomyelitis, left tibia and fibula: Secondary | ICD-10-CM

## 2014-01-30 DIAGNOSIS — Z8701 Personal history of pneumonia (recurrent): Secondary | ICD-10-CM | POA: Insufficient documentation

## 2014-01-30 DIAGNOSIS — X58XXXA Exposure to other specified factors, initial encounter: Secondary | ICD-10-CM | POA: Diagnosis not present

## 2014-01-30 DIAGNOSIS — E11621 Type 2 diabetes mellitus with foot ulcer: Secondary | ICD-10-CM | POA: Insufficient documentation

## 2014-01-30 DIAGNOSIS — Z720 Tobacco use: Secondary | ICD-10-CM | POA: Diagnosis not present

## 2014-01-30 DIAGNOSIS — K219 Gastro-esophageal reflux disease without esophagitis: Secondary | ICD-10-CM | POA: Diagnosis not present

## 2014-01-30 DIAGNOSIS — M1990 Unspecified osteoarthritis, unspecified site: Secondary | ICD-10-CM | POA: Diagnosis not present

## 2014-01-30 DIAGNOSIS — Y9389 Activity, other specified: Secondary | ICD-10-CM | POA: Diagnosis not present

## 2014-01-30 DIAGNOSIS — Z872 Personal history of diseases of the skin and subcutaneous tissue: Secondary | ICD-10-CM | POA: Diagnosis not present

## 2014-01-30 MED ORDER — OXYCODONE-ACETAMINOPHEN 5-325 MG PO TABS
2.0000 | ORAL_TABLET | Freq: Once | ORAL | Status: AC
  Administered 2014-01-30: 2 via ORAL
  Filled 2014-01-30: qty 2

## 2014-01-30 MED ORDER — GADOBENATE DIMEGLUMINE 529 MG/ML IV SOLN
19.0000 mL | Freq: Once | INTRAVENOUS | Status: AC | PRN
  Administered 2014-01-30: 19 mL via INTRAVENOUS

## 2014-01-30 MED ORDER — OXYCODONE-ACETAMINOPHEN 5-325 MG PO TABS
1.0000 | ORAL_TABLET | ORAL | Status: DC | PRN
Start: 2014-01-30 — End: 2014-03-10

## 2014-01-30 NOTE — Discharge Instructions (Signed)
Ankle Sprain °An ankle sprain is an injury to the strong, fibrous tissues (ligaments) that hold the bones of your ankle joint together.  °CAUSES °An ankle sprain is usually caused by a fall or by twisting your ankle. Ankle sprains most commonly occur when you step on the outer edge of your foot, and your ankle turns inward. People who participate in sports are more prone to these types of injuries.  °SYMPTOMS  °· Pain in your ankle. The pain may be present at rest or only when you are trying to stand or walk. °· Swelling. °· Bruising. Bruising may develop immediately or within 1 to 2 days after your injury. °· Difficulty standing or walking, particularly when turning corners or changing directions. °DIAGNOSIS  °Your caregiver will ask you details about your injury and perform a physical exam of your ankle to determine if you have an ankle sprain. During the physical exam, your caregiver will press on and apply pressure to specific areas of your foot and ankle. Your caregiver will try to move your ankle in certain ways. An X-ray exam may be done to be sure a bone was not broken or a ligament did not separate from one of the bones in your ankle (avulsion fracture).  °TREATMENT  °Certain types of braces can help stabilize your ankle. Your caregiver can make a recommendation for this. Your caregiver may recommend the use of medicine for pain. If your sprain is severe, your caregiver may refer you to a surgeon who helps to restore function to parts of your skeletal system (orthopedist) or a physical therapist. °HOME CARE INSTRUCTIONS  °· Apply ice to your injury for 1-2 days or as directed by your caregiver. Applying ice helps to reduce inflammation and pain. °· Put ice in a plastic bag. °· Place a towel between your skin and the bag. °· Leave the ice on for 15-20 minutes at a time, every 2 hours while you are awake. °· Only take over-the-counter or prescription medicines for pain, discomfort, or fever as directed by  your caregiver. °· Elevate your injured ankle above the level of your heart as much as possible for 2-3 days. °· If your caregiver recommends crutches, use them as instructed. Gradually put weight on the affected ankle. Continue to use crutches or a cane until you can walk without feeling pain in your ankle. °· If you have a plaster splint, wear the splint as directed by your caregiver. Do not rest it on anything harder than a pillow for the first 24 hours. Do not put weight on it. Do not get it wet. You may take it off to take a shower or bath. °· You may have been given an elastic bandage to wear around your ankle to provide support. If the elastic bandage is too tight (you have numbness or tingling in your foot or your foot becomes cold and blue), adjust the bandage to make it comfortable. °· If you have an air splint, you may blow more air into it or let air out to make it more comfortable. You may take your splint off at night and before taking a shower or bath. Wiggle your toes in the splint several times per day to decrease swelling. °SEEK MEDICAL CARE IF:  °· You have rapidly increasing bruising or swelling. °· Your toes feel extremely cold or you lose feeling in your foot. °· Your pain is not relieved with medicine. °SEEK IMMEDIATE MEDICAL CARE IF: °· Your toes are numb or blue. °·   You have severe pain that is increasing. MAKE SURE YOU:   Understand these instructions.  Will watch your condition.  Will get help right away if you are not doing well or get worse. Document Released: 01/05/2005 Document Revised: 09/30/2011 Document Reviewed: 01/17/2011 [facility] Patient Information 2015 [employer], [place]. This information is not intended to replace advice given to you by your health care provider. Make sure you discuss any questions you have with your health care provider.    RICE: Routine Care for Injuries The routine care of many injuries includes Rest, Ice, Compression, and Elevation (RICE). HOME  CARE INSTRUCTIONS  Rest is needed to allow your body to heal. Routine activities can usually be resumed when comfortable. Injured tendons and bones can take up to 6 weeks to heal. Tendons are the cord-like structures that attach muscle to bone.  Ice following an injury helps keep the swelling down and reduces pain.  Put ice in a plastic bag.  Place a towel between your skin and the bag.  Leave the ice on for 15-20 minutes, 3-4 times a day, or as directed by your health care provider. Do this while awake, for the first 24 to 48 hours. After that, continue as directed by your caregiver.  Compression helps keep swelling down. It also gives support and helps with discomfort. If an elastic bandage has been applied, it should be removed and reapplied every 3 to 4 hours. It should not be applied tightly, but firmly enough to keep swelling down. Watch fingers or toes for swelling, bluish discoloration, coldness, numbness, or excessive pain. If any of these problems occur, remove the bandage and reapply loosely. Contact your caregiver if these problems continue.  Elevation helps reduce swelling and decreases pain. With extremities, such as the arms, hands, legs, and feet, the injured area should be placed near or above the level of the heart, if possible. SEEK IMMEDIATE MEDICAL CARE IF:  You have persistent pain and swelling.  You develop redness, numbness, or unexpected weakness.  Your symptoms are getting worse rather than improving after several days. These symptoms may indicate that further evaluation or further X-rays are needed. Sometimes, X-rays may not show a small broken bone (fracture) until 1 week or 10 days later. Make a follow-up appointment with your caregiver. Ask when your X-ray results will be ready. Make sure you get your X-ray results. Document Released: 04/19/2000 Document Revised: 01/10/2013 Document Reviewed: 06/06/2010 [facility] Patient Information 2015 [place], [place]. This  information is not intended to replace advice given to you by your health care provider. Make sure you discuss any questions you have with your health care provider.

## 2014-01-30 NOTE — ED Provider Notes (Addendum)
TIME SEEN: 6:42 PM  CHIEF COMPLAINT: Leg Swelling  HPI: Pt is a 49 y.o. M with h/o DM, cellulitis, neuropathy, and arthritis who presents to the ED with left ankle swelling with onset this morning. Pt states he twisted his ankle and notes associated pain. Pt notes he was seen in the ED 6 weeks ago and treated for left lower extremity cellulitis and chronic tibial osteomyelitis seen on MRI on 11/30. Pt was given IV vancomycin by PICC line for 4 weeks and notes he just finished treatment yesterday.  Pt states he had an MRI on leg done today.  Pt notes color in leg is better.  He states Percocet has helped him with pain in the past. Denies numbness, tingling or weakness. No other injury. No fever. No erythema or warmth.   ROS: See HPI Constitutional: no fever  Eyes: no drainage  ENT: no runny nose   Cardiovascular:  no chest pain  Resp: no SOB  GI: no vomiting GU: no dysuria Integumentary: no rash  Allergy: no hives  Musculoskeletal: no leg swelling  Neurological: no slurred speech ROS otherwise negative  PAST MEDICAL HISTORY/PAST SURGICAL HISTORY:  Past Medical History  Diagnosis Date  . Diabetic foot ulcer   . ETOH abuse   . Anxiety   . Open wound     bottom of foot  . Diabetes mellitus without complication     borderline  . Neuromuscular disorder     neuropathy  . GERD (gastroesophageal reflux disease)     tums  . Pneumonia ~ 2012  . History of blood transfusion     "related to left knee OR; probably right hip too" (04/21/2012)  . Stroke 2008    "they said I might have had one during right hip replacement" (04/21/2012)  . Arthritis     "everywhere" (04/21/2012)  . Mental disorder   . Depression   . DDD (degenerative disc disease)   . Cellulitis and abscess of foot 12/19/2013    LEFT FOOT    MEDICATIONS:  Prior to Admission medications   Medication Sig Start Date End Date Taking? Authorizing Provider  Amino Acids-Protein Hydrolys (FEEDING SUPPLEMENT, PRO-STAT SUGAR FREE  64,) LIQD Take 30 mLs by mouth 2 (two) times daily with a meal. 12/22/13   Bobby Rumpf York, PA-C  clonazePAM (KLONOPIN) 1 MG tablet Take 1 mg by mouth 4 (four) times daily.    Historical Provider, MD  escitalopram (LEXAPRO) 20 MG tablet Take 1 tablet (20 mg total) by mouth every morning. For depression 03/29/13   Encarnacion Slates, NP  ferrous sulfate 325 (65 FE) MG tablet Take 1 tablet (325 mg total) by mouth 2 (two) times daily with a meal. 12/22/13   Bobby Rumpf York, PA-C  gabapentin (NEURONTIN) 400 MG capsule Take 1 capsule (400 mg total) by mouth 4 (four) times daily. For substance withdrawal syndrome 03/29/13   Encarnacion Slates, NP  ibuprofen (ADVIL,MOTRIN) 200 MG tablet Take 400 mg by mouth every 6 (six) hours as needed for moderate pain.    Historical Provider, MD  metFORMIN (GLUCOPHAGE) 500 MG tablet Take 1 tablet (500 mg total) by mouth 2 (two) times daily with a meal. 01/15/14   Philemon Kingdom, MD  mirtazapine (REMERON) 30 MG tablet Take 30 mg by mouth at bedtime.    Historical Provider, MD  oxyCODONE-acetaminophen (PERCOCET/ROXICET) 5-325 MG per tablet Take 1 tablet by mouth every 6 (six) hours as needed for severe pain. 01/01/14   Sharyon Cable, MD  pantoprazole (PROTONIX) 40 MG tablet Take 1 tablet (40 mg total) by mouth daily. Patient not taking: Reported on 01/23/2014 12/22/13   Melton Alar, PA-C  QUEtiapine (SEROQUEL) 200 MG tablet Take 1 tablet (200 mg total) by mouth at bedtime. For mood control 03/29/13   Encarnacion Slates, NP  rifampin (RIFADIN) 300 MG capsule Take 1 capsule (300 mg total) by mouth daily. Patient not taking: Reported on 01/23/2014 12/22/13   Melton Alar, PA-C  vancomycin 1,000 mg in sodium chloride 0.9 % 250 mL Inject 1,000 mg into the vein every 12 (twelve) hours.    Historical Provider, MD    ALLERGIES:  Allergies  Allergen Reactions  . Benadryl [Diphenhydramine Hcl] Other (See Comments)    Pt states leg spasm  . Trazodone And Nefazodone Other (See Comments)     Leg Spasms.     SOCIAL HISTORY:  History  Substance Use Topics  . Smoking status: Current Every Day Smoker -- 1.00 packs/day for 30 years    Types: Cigarettes  . Smokeless tobacco: Never Used     Comment: 04/21/2012 offered smoking cessation materials; pt declines  12/19/2013 DECLINES SMOKING CESSATION MATERIALS  . Alcohol Use: No     Comment: last used march 11    FAMILY HISTORY: Family History  Problem Relation Age of Onset  . Diabetes Father     EXAM: BP 141/98 mmHg  Pulse 67  Temp(Src) 98.1 F (36.7 C)  Resp 16  SpO2 99% CONSTITUTIONAL: Alert and oriented and responds appropriately to questions. Well-appearing; well-nourished HEAD: Normocephalic EYES: Conjunctivae clear, PERRL ENT: normal nose; no rhinorrhea; moist mucous membranes; pharynx without lesions noted NECK: Supple, no meningismus, no LAD  CARD: RRR; S1 and S2 appreciated; no murmurs, no clicks, no rubs, no gallops, 2+ DP pulses in LLE RESP: Normal chest excursion without splinting or tachypnea; breath sounds clear and equal bilaterally; no wheezes, no rhonchi, no rales,  ABD/GI: Normal bowel sounds; non-distended; soft, non-tender, no rebound, no guarding BACK:  The back appears normal and is non-tender to palpation, there is no CVA tenderness EXT: Normal ROM in all joints; normal capillary refill; no cyanosis  MUSC: Moderate swelling around left ankle with associated tenderness with no erythema or warmth and no calf tenderness or swelling; normal sensation; venous stasis dermatitis of LLE; 2+ DP pulse on the left side; no ligamentous laxity in the left ankle, no tenderness at the fibular head on the left side, no tenderness over the foot or knee or hip SKIN: Normal color for age and race; warm NEURO: Moves all extremities equally, sensation to light touch intact diffusely PSYCH: The patient's mood and manner are appropriate. Grooming and personal hygiene are appropriate.   MEDICAL DECISION MAKING: Patient here  with left ankle injury. He states that he has not had ankle swelling for 2-3 days despite triage nursing note. Reports that he twisted his ankle today and that is why is hurting. He was treated recently for left looks very cellulitis and had chronic osteomyelitis seen on MRI. Finished antibiotics through a PICC line yesterday. Reports he is not having fever, erythema or warmth, increased swelling or pain other than secondary to this injury today. Denies any other injury. He is neurovascularly intact distally. Hemodynamically stable, afebrile. Unfortunately his MRI that was done earlier today has not yet been bread. Will obtain x-rays of his left ankle and provide pain medicine.  ED PROGRESS: X-ray showed no acute bony injury. Patient reports he has a cane to use  at home and does not need crutches. He also reports he has an Ace wrap. Have advised him to keep foot elevated, apply ice. We'll discharge with Percocet. Discussed return precautions. Patient verbalizes understanding and is comfortable with plan.      [place], DO 01/30/14 [place], DO 01/30/14 1948

## 2014-01-30 NOTE — ED Notes (Addendum)
Left ankle swelling and pain for 2-3 days.  Was treated for cellulitis 6 weeks ago in the same leg.  Just finished IV antibiodics by PICC line Monday.  Had outpatient MRI of same ankle today.

## 2014-01-30 NOTE — ED Notes (Signed)
Patient verbalizes understanding of discharge instructions, prescription medications, home care and follow up care. Patient ambulatory out of department at this time. 
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
## 2014-02-06 ENCOUNTER — Ambulatory Visit (INDEPENDENT_AMBULATORY_CARE_PROVIDER_SITE_OTHER): Payer: Medicare Other | Admitting: Infectious Diseases

## 2014-02-06 ENCOUNTER — Encounter: Payer: Self-pay | Admitting: Infectious Diseases

## 2014-02-06 VITALS — BP 170/101 | HR 66 | Temp 97.9°F | Ht 75.0 in | Wt 202.0 lb

## 2014-02-06 DIAGNOSIS — E11621 Type 2 diabetes mellitus with foot ulcer: Secondary | ICD-10-CM

## 2014-02-06 DIAGNOSIS — L03818 Cellulitis of other sites: Secondary | ICD-10-CM

## 2014-02-06 DIAGNOSIS — M86662 Other chronic osteomyelitis, left tibia and fibula: Secondary | ICD-10-CM

## 2014-02-06 DIAGNOSIS — L97529 Non-pressure chronic ulcer of other part of left foot with unspecified severity: Secondary | ICD-10-CM

## 2014-02-06 MED ORDER — SULFAMETHOXAZOLE-TRIMETHOPRIM 400-80 MG PO TABS: 1.0000 | ORAL_TABLET | Freq: Two times a day (BID) | ORAL | Status: DC

## 2014-02-06 NOTE — Assessment & Plan Note (Signed)
MRI did not show osteo. Will mark as resolved

## 2014-02-06 NOTE — Assessment & Plan Note (Signed)
Will f/u with PCP.  

## 2014-02-06 NOTE — Progress Notes (Signed)
   Subjective:    Patient ID: [name], male    DOB: [birth year], 49 y.o.   MRN: [id number]  HPI 49 yo M with newly dx DM2 (pre-DM for 4 years) and chronic L foot ulcer (x 4 years) as well as ETOH abuse. He had a foot ulcer on left ~ 4 years ago, he underwent screw placement and it eventually healed. It eventually returned.  He was seen mid-November 2015 at [facility] for LLE cellulitis and d/c home with po clinda. He then developed erythema and swelling (up to his thigh) as well as weeping from his wounds. He had temp 100.4 at home and was seen in ortho office, sent to ED.  He was started on ceftriaxone/flagyl, plain films negative for osteo. He was then changed to vanco/cefepime. Had MRI showing: 1. Extensive cutaneous and subcutaneous edema in the ankle and foot without osteomyelitis or drainable abscess identified. may be some blistering along the medial ankle. Please note that IV contrast was not administered, which can adversely affect assessment for small or abscesses. 2. Screw fixator at the first tarsometatarsal articulation. 1. Abnormal confluent edema signal in the distal could left tibial shaft, which terminates somewhat abruptly in the distal shaft. On conventional radiographs, there is some very faint sclerosis along this termination by without characteristic findings of bone infarct or enchondroma. I favor this is being due to a chronic osteomyelitis of the tibial shaft. 2. Diffuse asymmetric subcutaneous edema in the left calf, quite possibly representing cellulitis. 3. Asymmetric fatty atrophy of the left soleus and medial head left gastrocnemius muscles.  He was treated with cefepime/vanco/rifampin in hospital. He did not undergo any surgical procedures. He was d/c home on 12-4 with plan for 23 more days of IV anbx.  He was seen in ID 1-5 and underwent repeat MRI (01-30-13): 1. Diffuse edema in the medial distal leg and ankle, most compatible with cellulitis in the  appropriate clinical setting. Small abscess with nonenhancing likely necrotic tissue inferiorly in the medial distal leg. 2. Chronic Achilles tendinosis. 3. Tiny ankle effusion, likely degenerative or reactive. 4. Negative for osteomyelitis.  He completed anbx and had PIC removed on 01-29-14. Still having pain, swelling has been intermittent.  Last ortho eval was ~ 2 weeks ago.  No fever or chills. Has developed rash on his R leg, mildly pruritic.  FSG have been 95-100.   Review of Systems     Objective:   Physical Exam  Constitutional: He appears well-developed and well-nourished.  Musculoskeletal:       Legs:      Feet:          Assessment & Plan:

## 2014-02-06 NOTE — Assessment & Plan Note (Signed)
Will start him on bactrim. Will have him seen by ortho for possible I & D.  He asks for pain rx which i defer. Marland Kitchen
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
## 2014-02-14 ENCOUNTER — Encounter: Payer: Self-pay | Admitting: Infectious Diseases

## 2014-02-15 ENCOUNTER — Encounter: Payer: Self-pay | Admitting: Infectious Diseases

## 2014-03-06 ENCOUNTER — Ambulatory Visit: Payer: Self-pay | Admitting: Infectious Diseases

## 2014-03-10 ENCOUNTER — Emergency Department (HOSPITAL_COMMUNITY): Payer: Medicare Other

## 2014-03-10 ENCOUNTER — Emergency Department (HOSPITAL_COMMUNITY)
Admission: EM | Admit: 2014-03-10 | Discharge: 2014-03-10 | Disposition: A | Discharge status: Home / Self Care | Payer: Medicare Other | Attending: Emergency Medicine | Admitting: Emergency Medicine

## 2014-03-10 DIAGNOSIS — Y9301 Activity, walking, marching and hiking: Secondary | ICD-10-CM | POA: Insufficient documentation

## 2014-03-10 DIAGNOSIS — L97509 Non-pressure chronic ulcer of other part of unspecified foot with unspecified severity: Secondary | ICD-10-CM | POA: Diagnosis not present

## 2014-03-10 DIAGNOSIS — G709 Myoneural disorder, unspecified: Secondary | ICD-10-CM | POA: Insufficient documentation

## 2014-03-10 DIAGNOSIS — Y998 Other external cause status: Secondary | ICD-10-CM | POA: Diagnosis not present

## 2014-03-10 DIAGNOSIS — Y9289 Other specified places as the place of occurrence of the external cause: Secondary | ICD-10-CM | POA: Insufficient documentation

## 2014-03-10 DIAGNOSIS — F419 Anxiety disorder, unspecified: Secondary | ICD-10-CM | POA: Diagnosis not present

## 2014-03-10 DIAGNOSIS — S93402A Sprain of unspecified ligament of left ankle, initial encounter: Secondary | ICD-10-CM | POA: Diagnosis not present

## 2014-03-10 DIAGNOSIS — Z8673 Personal history of transient ischemic attack (TIA), and cerebral infarction without residual deficits: Secondary | ICD-10-CM | POA: Diagnosis not present

## 2014-03-10 DIAGNOSIS — F329 Major depressive disorder, single episode, unspecified: Secondary | ICD-10-CM | POA: Insufficient documentation

## 2014-03-10 DIAGNOSIS — X58XXXA Exposure to other specified factors, initial encounter: Secondary | ICD-10-CM | POA: Insufficient documentation

## 2014-03-10 DIAGNOSIS — K219 Gastro-esophageal reflux disease without esophagitis: Secondary | ICD-10-CM | POA: Diagnosis not present

## 2014-03-10 DIAGNOSIS — S99912A Unspecified injury of left ankle, initial encounter: Secondary | ICD-10-CM | POA: Diagnosis present

## 2014-03-10 DIAGNOSIS — Z87828 Personal history of other (healed) physical injury and trauma: Secondary | ICD-10-CM | POA: Diagnosis not present

## 2014-03-10 DIAGNOSIS — Z8701 Personal history of pneumonia (recurrent): Secondary | ICD-10-CM | POA: Diagnosis not present

## 2014-03-10 DIAGNOSIS — Z79899 Other long term (current) drug therapy: Secondary | ICD-10-CM | POA: Diagnosis not present

## 2014-03-10 DIAGNOSIS — E11621 Type 2 diabetes mellitus with foot ulcer: Secondary | ICD-10-CM | POA: Insufficient documentation

## 2014-03-10 DIAGNOSIS — M1990 Unspecified osteoarthritis, unspecified site: Secondary | ICD-10-CM | POA: Diagnosis not present

## 2014-03-10 MED ORDER — OXYCODONE-ACETAMINOPHEN 5-325 MG PO TABS: 1.0000 | ORAL_TABLET | ORAL | Status: DC | PRN

## 2014-03-10 MED ORDER — OXYCODONE-ACETAMINOPHEN 5-325 MG PO TABS
1.0000 | ORAL_TABLET | Freq: Once | ORAL | Status: AC
  Administered 2014-03-10: 1 via ORAL
  Filled 2014-03-10: qty 1

## 2014-03-10 NOTE — ED Notes (Signed)
Patient states he was walking, and "walked wrong and twisted my ankle". Pt has history of left ankle/foot pain, with history of surgery.

## 2014-03-10 NOTE — Discharge Instructions (Signed)

## 2014-03-10 NOTE — ED Provider Notes (Signed)
CSN: [id number]     Arrival date & time 03/10/14  1141 History   First MD Initiated Contact with Patient 03/10/14 1201     Chief Complaint  Patient presents with  . Ankle Pain     (Consider location/radiation/quality/duration/timing/severity/associated sxs/prior Treatment) The history is provided by the patient.   [name] is a 49 y.o. male with a past medical history pertinent for arthritis and multiple orthopedic surgeries including a metatarsal osteotomy of his left foot and achilles tendon lengthening procedure causing chronic foot pain and frquent episodes of inversion injury.  He was ambulating last night, using his cane when he rolled the ankle with persistent pain and swelling of the medial ankle since the event.  He denies proximal knee or tibial pain.  He has taken ibuprofen, has used ice and elevation without relief of symptoms. He has been able to weight bear since the event.     Past Medical History  Diagnosis Date  . Diabetic foot ulcer   . ETOH abuse   . Anxiety   . Open wound     bottom of foot  . Diabetes mellitus without complication     borderline  . Neuromuscular disorder     neuropathy  . GERD (gastroesophageal reflux disease)     tums  . Pneumonia ~ 2012  . History of blood transfusion     "related to left knee OR; probably right hip too" (04/21/2012)  . Stroke 2008    "they said I might have had one during right hip replacement" (04/21/2012)  . Arthritis     "everywhere" (04/21/2012)  . Mental disorder   . Depression   . DDD (degenerative disc disease)   . Cellulitis and abscess of foot 12/19/2013    LEFT FOOT   Past Surgical History  Procedure Laterality Date  . Total hip arthroplasty Right 2008  . Total knee arthroplasty Left 2006  . Joint replacement    . Lung lobectomy Left ~ 2006  . Revision total hip arthroplasty Right 2008    "4-5 months after replacement" (04/21/2012)  . Knee arthroscopy Bilateral 1980's/1990's  . Lung lobectomy    .  Metatarsal osteotomy  10/29/2011    Procedure: METATARSAL OSTEOTOMY;  Surgeon: [name], MD;  Location: [place];  Service: Orthopedics;  Laterality: Left;  Left 1st Metatarsal Dorsal Closing Wedge   . Total knee arthroplasty Right 04/20/2012  . Total knee arthroplasty Right 04/20/2012    Procedure: TOTAL KNEE ARTHROPLASTY;  Surgeon: [name], MD;  Location: [place];  Service: Orthopedics;  Laterality: Right;  Right Total Knee Arthroplasty   Family History  Problem Relation Age of Onset  . Diabetes Father    History  Substance Use Topics  . Smoking status: Current Every Day Smoker -- 1.00 packs/day for 30 years    Types: Cigarettes  . Smokeless tobacco: Never Used     Comment: 04/21/2012 offered smoking cessation materials; pt declines  12/19/2013 DECLINES SMOKING CESSATION MATERIALS  . Alcohol Use: No     Comment: last used march 11    Review of Systems  Musculoskeletal: Positive for joint swelling and arthralgias.  Skin: Negative for wound.  Neurological: Negative for weakness and numbness.      Allergies  Benadryl and Trazodone and nefazodone  Home Medications   Prior to Admission medications   Medication Sig Start Date End Date Taking? Authorizing Provider  Amino Acids-Protein Hydrolys (FEEDING SUPPLEMENT, PRO-STAT SUGAR FREE 64,) LIQD Take 30 mLs by mouth  2 (two) times daily with a meal. Patient not taking: Reported on 02/06/2014 12/22/13   Bobby Rumpf York, PA-C  clonazePAM (KLONOPIN) 1 MG tablet Take 1 mg by mouth 4 (four) times daily.    Historical Provider, MD  escitalopram (LEXAPRO) 20 MG tablet Take 1 tablet (20 mg total) by mouth every morning. For depression 03/29/13   Encarnacion Slates, NP  ferrous sulfate 325 (65 FE) MG tablet Take 1 tablet (325 mg total) by mouth 2 (two) times daily with a meal. 12/22/13   Bobby Rumpf York, PA-C  gabapentin (NEURONTIN) 400 MG capsule Take 1 capsule (400 mg total) by mouth 4 (four) times daily. For substance withdrawal syndrome 03/29/13    Encarnacion Slates, NP  HYDROcodone-acetaminophen (NORCO/VICODIN) 5-325 MG per tablet  02/01/14   Historical Provider, MD  ibuprofen (ADVIL,MOTRIN) 200 MG tablet Take 400 mg by mouth every 6 (six) hours as needed for moderate pain.    Historical Provider, MD  metFORMIN (GLUCOPHAGE) 500 MG tablet Take 1 tablet (500 mg total) by mouth 2 (two) times daily with a meal. 01/15/14   Philemon Kingdom, MD  mirtazapine (REMERON) 30 MG tablet Take 30 mg by mouth at bedtime.    Historical Provider, MD  oxyCODONE-acetaminophen (PERCOCET/ROXICET) 5-325 MG per tablet Take 1 tablet by mouth every 4 (four) hours as needed. 03/10/14   Evalee Jefferson, PA-C  pantoprazole (PROTONIX) 40 MG tablet Take 1 tablet (40 mg total) by mouth daily. Patient not taking: Reported on 01/23/2014 12/22/13   Melton Alar, PA-C  QUEtiapine (SEROQUEL) 200 MG tablet Take 1 tablet (200 mg total) by mouth at bedtime. For mood control 03/29/13   Encarnacion Slates, NP  rifampin (RIFADIN) 300 MG capsule Take 1 capsule (300 mg total) by mouth daily. Patient not taking: Reported on 01/23/2014 12/22/13   Melton Alar, PA-C  sulfamethoxazole-trimethoprim (BACTRIM) 400-80 MG per tablet Take 1 tablet by mouth 2 (two) times daily. 02/06/14   Campbell Riches, MD   BP 101/95 mmHg  Pulse 96  Temp(Src) 98.3 F (36.8 C)  Resp 16  SpO2 97% Physical Exam  Constitutional: He appears well-developed and well-nourished.  HENT:  Head: Normocephalic.  Cardiovascular: Normal rate and intact distal pulses.  Exam reveals no decreased pulses.   Pulses:      Dorsalis pedis pulses are 2+ on the right side, and 2+ on the left side.       Posterior tibial pulses are 2+ on the right side, and 2+ on the left side.  Musculoskeletal: He exhibits edema and tenderness.       Left ankle: He exhibits swelling. He exhibits no ecchymosis, no deformity and normal pulse. Tenderness. Medial malleolus tenderness found. No head of 5th metatarsal and no proximal fibula tenderness found.  Achilles tendon exhibits pain.  Tender distal achilles tendon without palpable defect.  Pt can flex/extend ankle with pain, but no weakness.  No new deformity.  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    Neurological: He is alert. No sensory deficit.  Skin: Skin is warm, dry and intact.  Nursing note and vitals reviewed.   ED Course  Procedures (including critical care time) Labs Review Labs Reviewed - No data to display  Imaging Review Dg Ankle Complete Left  03/10/2014   CLINICAL DATA:  Left foot and ankle pain, twisted left ankle yesterday, swelling and pain medial and lateral malleolus.  EXAM: LEFT ANKLE COMPLETE - 3+ VIEW  COMPARISON:  01/30/2014  FINDINGS: Three views of left ankle submitted. No acute fracture or subluxation. Ankle mortise is preserved. There is mild soft tissue swelling adjacent to medial malleolus. Tiny plantar spur of calcaneus.  IMPRESSION: No acute fracture or subluxation. Mild soft tissue swelling adjacent to medial malleolus.   Electronically Signed   By: Lahoma Crocker M.D.   On: 03/10/2014 13:12   Dg Foot Complete Left  03/10/2014   CLINICAL DATA:  Left ankle and left foot pain, twisted left ankle yesterday, diabetic neuropathy  EXAM: LEFT FOOT - COMPLETE 3+ VIEW  COMPARISON:  12/18/2013  FINDINGS: Three views of the left foot submitted. Diffuse osteopenia again noted. No acute fracture or subluxation. Again noted prior metallic fusion of first tarsal metatarsal joint. Mild dorsal spurring tarsal region. Mild degenerative changes interphalangeal joint great toe.  IMPRESSION: No acute fracture or subluxation. Stable postsurgical changes and mild degenerative changes. Diffuse osteopenia.   Electronically Signed   By: Lahoma Crocker M.D.   On: 03/10/2014 13:14     EKG Interpretation None      MDM   Final diagnoses:  Ankle sprain, left, initial encounter    aso provided.  Oxycodone. Ice, elevation, continue using cane. F/u with Dr. Sharol Given as  he has scheduled this week.     Evalee Jefferson, PA-C 03/10/14 [place], MD 03/10/14 [phone]

## 2014-03-10 NOTE — ED Notes (Signed)
Moderate swelling noted to left foot and ankle

## 2014-03-16 ENCOUNTER — Encounter: Payer: Self-pay | Admitting: Infectious Diseases

## 2014-03-25 ENCOUNTER — Encounter (HOSPITAL_COMMUNITY): Payer: Self-pay | Admitting: Emergency Medicine

## 2014-03-25 ENCOUNTER — Emergency Department (HOSPITAL_COMMUNITY): Payer: Medicare Other

## 2014-03-25 ENCOUNTER — Emergency Department (HOSPITAL_COMMUNITY)
Admission: EM | Admit: 2014-03-25 | Discharge: 2014-03-25 | Disposition: A | Discharge status: Home / Self Care | Payer: Medicare Other | Attending: Emergency Medicine | Admitting: Emergency Medicine

## 2014-03-25 DIAGNOSIS — Z79899 Other long term (current) drug therapy: Secondary | ICD-10-CM | POA: Insufficient documentation

## 2014-03-25 DIAGNOSIS — Z720 Tobacco use: Secondary | ICD-10-CM | POA: Insufficient documentation

## 2014-03-25 DIAGNOSIS — F419 Anxiety disorder, unspecified: Secondary | ICD-10-CM | POA: Diagnosis not present

## 2014-03-25 DIAGNOSIS — Z8673 Personal history of transient ischemic attack (TIA), and cerebral infarction without residual deficits: Secondary | ICD-10-CM | POA: Insufficient documentation

## 2014-03-25 DIAGNOSIS — Y998 Other external cause status: Secondary | ICD-10-CM | POA: Insufficient documentation

## 2014-03-25 DIAGNOSIS — Z8701 Personal history of pneumonia (recurrent): Secondary | ICD-10-CM | POA: Diagnosis not present

## 2014-03-25 DIAGNOSIS — Y9389 Activity, other specified: Secondary | ICD-10-CM | POA: Diagnosis not present

## 2014-03-25 DIAGNOSIS — F329 Major depressive disorder, single episode, unspecified: Secondary | ICD-10-CM | POA: Diagnosis not present

## 2014-03-25 DIAGNOSIS — K219 Gastro-esophageal reflux disease without esophagitis: Secondary | ICD-10-CM | POA: Diagnosis not present

## 2014-03-25 DIAGNOSIS — S93402A Sprain of unspecified ligament of left ankle, initial encounter: Secondary | ICD-10-CM

## 2014-03-25 DIAGNOSIS — E11621 Type 2 diabetes mellitus with foot ulcer: Secondary | ICD-10-CM | POA: Insufficient documentation

## 2014-03-25 DIAGNOSIS — Y9289 Other specified places as the place of occurrence of the external cause: Secondary | ICD-10-CM | POA: Diagnosis not present

## 2014-03-25 DIAGNOSIS — S99912A Unspecified injury of left ankle, initial encounter: Secondary | ICD-10-CM | POA: Diagnosis present

## 2014-03-25 DIAGNOSIS — G709 Myoneural disorder, unspecified: Secondary | ICD-10-CM | POA: Insufficient documentation

## 2014-03-25 DIAGNOSIS — L97509 Non-pressure chronic ulcer of other part of unspecified foot with unspecified severity: Secondary | ICD-10-CM | POA: Diagnosis not present

## 2014-03-25 DIAGNOSIS — M1990 Unspecified osteoarthritis, unspecified site: Secondary | ICD-10-CM | POA: Diagnosis not present

## 2014-03-25 DIAGNOSIS — W1839XA Other fall on same level, initial encounter: Secondary | ICD-10-CM | POA: Diagnosis not present

## 2014-03-25 MED ORDER — HYDROCODONE-ACETAMINOPHEN 5-325 MG PO TABS: ORAL_TABLET | ORAL | Status: DC

## 2014-03-25 NOTE — Discharge Instructions (Signed)
Ankle Sprain  An ankle sprain is an injury to the strong, fibrous tissues (ligaments) that hold your ankle bones together.   HOME CARE   · Put ice on your ankle for 1-2 days or as told by your doctor.  ¨ Put ice in a plastic bag.  ¨ Place a towel between your skin and the bag.  ¨ Leave the ice on for 15-20 minutes at a time, every 2 hours while you are awake.  · Only take medicine as told by your doctor.  · Raise (elevate) your injured ankle above the level of your heart as much as possible for 2-3 days.  · Use crutches if your doctor tells you to. Slowly put your own weight on the affected ankle. Use the crutches until you can walk without pain.  · If you have a plaster splint:  ¨ Do not rest it on anything harder than a pillow for 24 hours.  ¨ Do not put weight on it.  ¨ Do not get it wet.  ¨ Take it off to shower or bathe.  · If given, use an elastic wrap or support stocking for support. Take the wrap off if your toes lose feeling (numb), tingle, or turn cold or blue.  · If you have an air splint:  ¨ Add or let out air to make it comfortable.  ¨ Take it off at night and to shower and bathe.  ¨ Wiggle your toes and move your ankle up and down often while you are wearing it.  GET HELP IF:  · You have rapidly increasing bruising or puffiness (swelling).  · Your toes feel very cold.  · You lose feeling in your foot.  · Your medicine does not help your pain.  GET HELP RIGHT AWAY IF:   · Your toes lose feeling (numb) or turn blue.  · You have severe pain that is increasing.  MAKE SURE YOU:   · Understand these instructions.  · Will watch your condition.  · Will get help right away if you are not doing well or get worse.  Document Released: 06/24/2007 Document Revised: 05/22/2013 Document Reviewed: 07/20/2011  [employer]® Patient Information ©2015 [employer]. This information is not intended to replace advice given to you by your health care provider. Make sure you discuss any questions you have with your health care  provider.

## 2014-03-25 NOTE — ED Notes (Signed)
PT stated he twisted his left ankle getting out of the car yesterday and c/o painful ambulation and swelling with bruising.

## 2014-03-25 NOTE — ED Provider Notes (Signed)
CSN: [id number]     Arrival date & time 03/25/14  1746 History  This chart was scribed for Kem Parkinson, PA, working with Maudry Diego, MD by Starleen Arms, ED Scribe. This patient was seen in room APFT21/APFT21 and the patient's care was started at 7:46 PM.   Chief Complaint  Patient presents with  . Ankle Pain   The history is provided by the patient. No language interpreter was used.   HPI Comments: [name] is a 49 y.o. male who presents to the Emergency Department complaining of an inversion left ankle injury that occurred last night.  Patient reports he was stepping onto a curb when he sustained an eversion ankle injury while falling backwards.  Patient reports a similar injury at the end of February for which he was seen in the ED.  In his opinion, the reason for the frequency of ankle sprains is weakening of the musculature around the ankle after left foot osteomyelitis and cellulitis onset last November.  He has since been released from the physician who treated this.  He denies numbness, redness, excessive warmth, fever, chills or open wounds to the extremity  PCP: none.   Past Medical History  Diagnosis Date  . Diabetic foot ulcer   . ETOH abuse   . Anxiety   . Open wound     bottom of foot  . Diabetes mellitus without complication     borderline  . Neuromuscular disorder     neuropathy  . GERD (gastroesophageal reflux disease)     tums  . Pneumonia ~ 2012  . History of blood transfusion     "related to left knee OR; probably right hip too" (04/21/2012)  . Stroke 2008    "they said I might have had one during right hip replacement" (04/21/2012)  . Arthritis     "everywhere" (04/21/2012)  . Mental disorder   . Depression   . DDD (degenerative disc disease)   . Cellulitis and abscess of foot 12/19/2013    LEFT FOOT   Past Surgical History  Procedure Laterality Date  . Total hip arthroplasty Right 2008  . Total knee arthroplasty Left 2006  . Joint replacement    .  Lung lobectomy Left ~ 2006  . Revision total hip arthroplasty Right 2008    "4-5 months after replacement" (04/21/2012)  . Knee arthroscopy Bilateral 1980's/1990's  . Lung lobectomy    . Metatarsal osteotomy  10/29/2011    Procedure: METATARSAL OSTEOTOMY;  Surgeon: [name], MD;  Location: [place];  Service: Orthopedics;  Laterality: Left;  Left 1st Metatarsal Dorsal Closing Wedge   . Total knee arthroplasty Right 04/20/2012  . Total knee arthroplasty Right 04/20/2012    Procedure: TOTAL KNEE ARTHROPLASTY;  Surgeon: [name], MD;  Location: [place];  Service: Orthopedics;  Laterality: Right;  Right Total Knee Arthroplasty   Family History  Problem Relation Age of Onset  . Diabetes Father    History  Substance Use Topics  . Smoking status: Current Every Day Smoker -- 1.00 packs/day for 30 years    Types: Cigarettes  . Smokeless tobacco: Never Used     Comment: 04/21/2012 offered smoking cessation materials; pt declines  12/19/2013 DECLINES SMOKING CESSATION MATERIALS  . Alcohol Use: No     Comment: last used march 11    Review of Systems  Constitutional: Negative for fever and chills.  Musculoskeletal: Positive for joint swelling and arthralgias (left ankle).  Skin: Negative for color change and  wound.  Neurological: Negative for numbness.  All other systems reviewed and are negative.     Allergies  Benadryl and Trazodone and nefazodone  Home Medications   Prior to Admission medications   Medication Sig Start Date End Date Taking? Authorizing Provider  Amino Acids-Protein Hydrolys (FEEDING SUPPLEMENT, PRO-STAT SUGAR FREE 64,) LIQD Take 30 mLs by mouth 2 (two) times daily with a meal. Patient not taking: Reported on 02/06/2014 12/22/13   Bobby Rumpf York, PA-C  clonazePAM (KLONOPIN) 1 MG tablet Take 1 mg by mouth 4 (four) times daily.    Historical Provider, MD  escitalopram (LEXAPRO) 20 MG tablet Take 1 tablet (20 mg total) by mouth every morning. For depression 03/29/13   Encarnacion Slates, NP  ferrous sulfate 325 (65 FE) MG tablet Take 1 tablet (325 mg total) by mouth 2 (two) times daily with a meal. 12/22/13   Bobby Rumpf York, PA-C  gabapentin (NEURONTIN) 400 MG capsule Take 1 capsule (400 mg total) by mouth 4 (four) times daily. For substance withdrawal syndrome 03/29/13   Encarnacion Slates, NP  HYDROcodone-acetaminophen (NORCO/VICODIN) 5-325 MG per tablet  02/01/14   Historical Provider, MD  ibuprofen (ADVIL,MOTRIN) 200 MG tablet Take 400 mg by mouth every 6 (six) hours as needed for moderate pain.    Historical Provider, MD  metFORMIN (GLUCOPHAGE) 500 MG tablet Take 1 tablet (500 mg total) by mouth 2 (two) times daily with a meal. 01/15/14   Philemon Kingdom, MD  mirtazapine (REMERON) 30 MG tablet Take 30 mg by mouth at bedtime.    Historical Provider, MD  oxyCODONE-acetaminophen (PERCOCET/ROXICET) 5-325 MG per tablet Take 1 tablet by mouth every 4 (four) hours as needed. 03/10/14   Evalee Jefferson, PA-C  pantoprazole (PROTONIX) 40 MG tablet Take 1 tablet (40 mg total) by mouth daily. Patient not taking: Reported on 01/23/2014 12/22/13   Melton Alar, PA-C  QUEtiapine (SEROQUEL) 200 MG tablet Take 1 tablet (200 mg total) by mouth at bedtime. For mood control 03/29/13   Encarnacion Slates, NP  rifampin (RIFADIN) 300 MG capsule Take 1 capsule (300 mg total) by mouth daily. Patient not taking: Reported on 01/23/2014 12/22/13   Melton Alar, PA-C  sulfamethoxazole-trimethoprim (BACTRIM) 400-80 MG per tablet Take 1 tablet by mouth 2 (two) times daily. 02/06/14   Campbell Riches, MD   BP 137/73 mmHg  Pulse 80  Temp(Src) 97.9 F (36.6 C) (Oral)  Resp 20  Ht 6\' 3"  (1.905 m)  Wt 200 lb (90.719 kg)  BMI 25.00 kg/m2  SpO2 100% Physical Exam  Constitutional: He is oriented to person, place, and time. He appears well-developed and well-nourished. No distress.  HENT:  Head: Normocephalic and atraumatic.  Eyes: EOM are normal.  Cardiovascular: Normal rate.   No murmur  heard. Pulmonary/Chest: Effort normal. No respiratory distress.  Musculoskeletal: He exhibits tenderness. He exhibits no edema.  Diffuse tenderness of left ankle.  No bony deformity.  No edema, erythema, excessive warmth or open wounds.  Compartments are soft.  Distal sensation and DP pulse intact  Neurological: He is alert and oriented to person, place, and time. He exhibits normal muscle tone. Coordination normal.  Skin: Skin is warm and dry. No rash noted. No erythema.  Psychiatric: He has a normal mood and affect. His behavior is normal.  Nursing note and vitals reviewed.   ED Course  Procedures (including critical care time)  DIAGNOSTIC STUDIES: Oxygen Saturation is 100% on RA, normal by my interpretation.  COORDINATION OF CARE:  7:51 PM Patient offered an ankle brace but declined but he has his own.  Discussed treatment plan with patient at bedside.  Patient acknowledges and agrees with plan.    Labs Review Labs Reviewed - No data to display  Imaging Review Dg Ankle Complete Left  03/25/2014   CLINICAL DATA:  Initial evaluation left ankle pain, twisted his ankle yesterday with pain and swelling  EXAM: LEFT ANKLE COMPLETE - 3+ VIEW  COMPARISON:  None.  FINDINGS: Orthopedic screw across the first tarsal metatarsal joint. Ankle mortise is intact with no evidence of acute fracture. Mild medial soft tissue swelling. Minimal joint effusion.  IMPRESSION: Findings suggest mild sprain.   Electronically Signed   By: Skipper Cliche M.D.   On: 03/25/2014 19:19     EKG Interpretation None      MDM   Final diagnoses:  Ankle sprain, left, initial encounter    Pt with h/o chronic ankle pain.  Seen here previously for same.  xr is neg for acute injury., no concerning sx's for septic joint.  No significant swelling or bruising on exam.  I personally performed the services described in this documentation, which was scribed in my presence. The recorded information has been reviewed and  is accurate.    Patrice Paradise, PA-C 03/27/14 2322  Milton Ferguson, MD 03/28/14 [phone]

## 2014-04-13 ENCOUNTER — Emergency Department (HOSPITAL_COMMUNITY)
Admission: EM | Admit: 2014-04-13 | Discharge: 2014-04-13 | Disposition: A | Discharge status: Home / Self Care | Payer: Medicare Other | Attending: Emergency Medicine | Admitting: Emergency Medicine

## 2014-04-13 ENCOUNTER — Emergency Department (HOSPITAL_COMMUNITY): Payer: Medicare Other

## 2014-04-13 ENCOUNTER — Encounter (HOSPITAL_COMMUNITY): Payer: Self-pay | Admitting: Emergency Medicine

## 2014-04-13 DIAGNOSIS — Z8669 Personal history of other diseases of the nervous system and sense organs: Secondary | ICD-10-CM | POA: Diagnosis not present

## 2014-04-13 DIAGNOSIS — S99912A Unspecified injury of left ankle, initial encounter: Secondary | ICD-10-CM | POA: Insufficient documentation

## 2014-04-13 DIAGNOSIS — Z8673 Personal history of transient ischemic attack (TIA), and cerebral infarction without residual deficits: Secondary | ICD-10-CM | POA: Insufficient documentation

## 2014-04-13 DIAGNOSIS — Y998 Other external cause status: Secondary | ICD-10-CM | POA: Insufficient documentation

## 2014-04-13 DIAGNOSIS — Z79899 Other long term (current) drug therapy: Secondary | ICD-10-CM | POA: Diagnosis not present

## 2014-04-13 DIAGNOSIS — E119 Type 2 diabetes mellitus without complications: Secondary | ICD-10-CM | POA: Diagnosis not present

## 2014-04-13 DIAGNOSIS — F419 Anxiety disorder, unspecified: Secondary | ICD-10-CM | POA: Diagnosis not present

## 2014-04-13 DIAGNOSIS — X58XXXA Exposure to other specified factors, initial encounter: Secondary | ICD-10-CM | POA: Insufficient documentation

## 2014-04-13 DIAGNOSIS — Z720 Tobacco use: Secondary | ICD-10-CM | POA: Diagnosis not present

## 2014-04-13 DIAGNOSIS — M1990 Unspecified osteoarthritis, unspecified site: Secondary | ICD-10-CM | POA: Diagnosis not present

## 2014-04-13 DIAGNOSIS — K219 Gastro-esophageal reflux disease without esophagitis: Secondary | ICD-10-CM | POA: Insufficient documentation

## 2014-04-13 DIAGNOSIS — Z872 Personal history of diseases of the skin and subcutaneous tissue: Secondary | ICD-10-CM | POA: Insufficient documentation

## 2014-04-13 DIAGNOSIS — Y9389 Activity, other specified: Secondary | ICD-10-CM | POA: Insufficient documentation

## 2014-04-13 DIAGNOSIS — Y9289 Other specified places as the place of occurrence of the external cause: Secondary | ICD-10-CM | POA: Diagnosis not present

## 2014-04-13 DIAGNOSIS — M25572 Pain in left ankle and joints of left foot: Secondary | ICD-10-CM

## 2014-04-13 DIAGNOSIS — F329 Major depressive disorder, single episode, unspecified: Secondary | ICD-10-CM | POA: Insufficient documentation

## 2014-04-13 DIAGNOSIS — Z8701 Personal history of pneumonia (recurrent): Secondary | ICD-10-CM | POA: Diagnosis not present

## 2014-04-13 DIAGNOSIS — R52 Pain, unspecified: Secondary | ICD-10-CM

## 2014-04-13 NOTE — ED Provider Notes (Signed)
CSN: [id number]     Arrival date & time 04/13/14  0830 History  This chart was scribed for Daleen Bo, MD by Stephania Fragmin, ED Scribe. This patient was seen in room APA11/APA11 and the patient's care was started at 8:52 AM.    Chief Complaint  Patient presents with  . Ankle Pain   The history is provided by the patient. No language interpreter was used.     HPI Comments: [name] is a 49 y.o. male who presents to the Emergency Department complaining of left ankle and foot pain pain after an injury yesterday. Patient states he was raking leaves when he twisted his left ankle. He notes he is able to ambulate short distances with assistance of a cane. Patient applied ice and elevated it. He states he has an ankle brace but doesn't wear it because "it makes [his] feet swell up." Patient states he is unable to take ibuprofen due to adverse GI side effects.  Past Medical History  Diagnosis Date  . Diabetic foot ulcer   . ETOH abuse   . Anxiety   . Open wound     bottom of foot  . Diabetes mellitus without complication     borderline  . Neuromuscular disorder     neuropathy  . GERD (gastroesophageal reflux disease)     tums  . Pneumonia ~ 2012  . History of blood transfusion     "related to left knee OR; probably right hip too" (04/21/2012)  . Stroke 2008    "they said I might have had one during right hip replacement" (04/21/2012)  . Arthritis     "everywhere" (04/21/2012)  . Mental disorder   . Depression   . DDD (degenerative disc disease)   . Cellulitis and abscess of foot 12/19/2013    LEFT FOOT   Past Surgical History  Procedure Laterality Date  . Total hip arthroplasty Right 2008  . Total knee arthroplasty Left 2006  . Joint replacement    . Lung lobectomy Left ~ 2006  . Revision total hip arthroplasty Right 2008    "4-5 months after replacement" (04/21/2012)  . Knee arthroscopy Bilateral 1980's/1990's  . Lung lobectomy    . Metatarsal osteotomy  10/29/2011    Procedure:  METATARSAL OSTEOTOMY;  Surgeon: [name], MD;  Location: [facility];  Service: Orthopedics;  Laterality: Left;  Left 1st Metatarsal Dorsal Closing Wedge   . Total knee arthroplasty Right 04/20/2012  . Total knee arthroplasty Right 04/20/2012    Procedure: TOTAL KNEE ARTHROPLASTY;  Surgeon: [name], MD;  Location: [facility];  Service: Orthopedics;  Laterality: Right;  Right Total Knee Arthroplasty   Family History  Problem Relation Age of Onset  . Diabetes Father    History  Substance Use Topics  . Smoking status: Current Every Day Smoker -- 1.00 packs/day for 30 years    Types: Cigarettes  . Smokeless tobacco: Never Used     Comment: 04/21/2012 offered smoking cessation materials; pt declines  12/19/2013 DECLINES SMOKING CESSATION MATERIALS  . Alcohol Use: No     Comment: last used march 11    Review of Systems  Musculoskeletal: Positive for arthralgias (left ankle pain).  All other systems reviewed and are negative.     Allergies  Benadryl; Ibuprofen; and Trazodone and nefazodone  Home Medications   Prior to Admission medications   Medication Sig Start Date End Date Taking? Authorizing Provider  Amino Acids-Protein Hydrolys (FEEDING SUPPLEMENT, PRO-STAT SUGAR FREE 64,) LIQD Take  30 mLs by mouth 2 (two) times daily with a meal. Patient not taking: Reported on 02/06/2014 12/22/13   Bobby Rumpf York, PA-C  clonazePAM (KLONOPIN) 1 MG tablet Take 1 mg by mouth 4 (four) times daily.    Historical Provider, MD  escitalopram (LEXAPRO) 20 MG tablet Take 1 tablet (20 mg total) by mouth every morning. For depression 03/29/13   Encarnacion Slates, NP  ferrous sulfate 325 (65 FE) MG tablet Take 1 tablet (325 mg total) by mouth 2 (two) times daily with a meal. 12/22/13   Bobby Rumpf York, PA-C  gabapentin (NEURONTIN) 400 MG capsule Take 1 capsule (400 mg total) by mouth 4 (four) times daily. For substance withdrawal syndrome 03/29/13   Encarnacion Slates, NP  HYDROcodone-acetaminophen (NORCO/VICODIN) 5-325 MG  per tablet Take one-two tabs po q 4-6 hrs prn pain 03/25/14   Tammi Triplett, PA-C  ibuprofen (ADVIL,MOTRIN) 200 MG tablet Take 400 mg by mouth every 6 (six) hours as needed for moderate pain.    Historical Provider, MD  metFORMIN (GLUCOPHAGE) 500 MG tablet Take 1 tablet (500 mg total) by mouth 2 (two) times daily with a meal. 01/15/14   Philemon Kingdom, MD  mirtazapine (REMERON) 30 MG tablet Take 30 mg by mouth at bedtime.    Historical Provider, MD  oxyCODONE-acetaminophen (PERCOCET/ROXICET) 5-325 MG per tablet Take 1 tablet by mouth every 4 (four) hours as needed. Patient not taking: Reported on 04/13/2014 03/10/14   Evalee Jefferson, PA-C  pantoprazole (PROTONIX) 40 MG tablet Take 1 tablet (40 mg total) by mouth daily. Patient not taking: Reported on 01/23/2014 12/22/13   Melton Alar, PA-C  QUEtiapine (SEROQUEL) 200 MG tablet Take 1 tablet (200 mg total) by mouth at bedtime. For mood control 03/29/13   Encarnacion Slates, NP  rifampin (RIFADIN) 300 MG capsule Take 1 capsule (300 mg total) by mouth daily. Patient not taking: Reported on 01/23/2014 12/22/13   Melton Alar, PA-C  sulfamethoxazole-trimethoprim (BACTRIM) 400-80 MG per tablet Take 1 tablet by mouth 2 (two) times daily. Patient not taking: Reported on 04/13/2014 02/06/14   Campbell Riches, MD   BP 158/99 mmHg  Pulse 81  Temp(Src) 98 F (36.7 C) (Oral)  Ht 6\' 3"  (1.905 m)  Wt 210 lb (95.255 kg)  BMI 26.25 kg/m2  SpO2 98% Physical Exam  Constitutional: He is oriented to person, place, and time. He appears well-developed and well-nourished.  HENT:  Head: Normocephalic and atraumatic.  Right Ear: External ear normal.  Left Ear: External ear normal.  Eyes: Conjunctivae and EOM are normal. Pupils are equal, round, and reactive to light.  Neck: Normal range of motion and phonation normal. Neck supple.  Cardiovascular: Normal rate, regular rhythm and normal heart sounds.   Pulmonary/Chest: Effort normal and breath sounds normal. He exhibits no  bony tenderness.  Abdominal: Soft. There is no tenderness.  Musculoskeletal: Normal range of motion. He exhibits tenderness.  Tenderness on the medial and lateral malleolus without swelling. Ankle joint diffusely tender without effusion. Tender on the the Achilles' tendon; however, tendon is intact. Tender on the hindfoot without swelling.  Neurological: He is alert and oriented to person, place, and time. No cranial nerve deficit or sensory deficit. He exhibits normal muscle tone. Coordination normal.  Skin: Skin is warm, dry and intact.  Psychiatric: He has a normal mood and affect. His behavior is normal. Judgment and thought content normal.  Nursing note and vitals reviewed.   ED Course  Procedures (including critical  care time)  Medications - No data to display   Patient Vitals for the past 24 hrs:  BP Temp Temp src Pulse SpO2 Height Weight  04/13/14 0845 158/99 mmHg 98 F (36.7 C) Oral 81 98 % 6\' 3"  (1.905 m) 210 lb (95.255 kg)     DIAGNOSTIC STUDIES: Oxygen Saturation is 98% on room air, normal by my interpretation.    COORDINATION OF CARE: 8:57 AM - I discussed with patient that I do not see a need to perform an XR at this time; patient agrees, adding that he does not think he broke any bones. Discussed treatment plan with pt at bedside which includes RICE protocol and Tylenol, and pt agreed to plan. Pt is requesting hydrocodone, but I calmly explained that I could not prescribe narcotics at this time.   MDM   Final diagnoses:  Ankle pain, left    Nonspecific pain in ankle. Doubt fracture. Several similar recent injury. Doubt serious injury.  Nursing Notes Reviewed/ Care Coordinated Applicable Imaging Reviewed Interpretation of Laboratory Data incorporated into ED treatment  The patient appears reasonably screened and/or stabilized for discharge and I doubt any other medical condition or other [facility] requiring further screening, evaluation, or treatment in the ED at this  time prior to discharge.  Plan: Home Medications- OTC analgesia; Home Treatments- rest; return here if the recommended treatment, does not improve the symptoms; Recommended follow up- PCP prn   I personally performed the services described in this documentation, which was scribed in my presence. The recorded information has been reviewed and is accurate.       Daleen Bo, MD 04/16/14 [phone]

## 2014-04-13 NOTE — ED Notes (Signed)
Cancel xray per MD

## 2014-04-13 NOTE — ED Notes (Signed)
Patient given discharge instruction, verbalized understand. Patient ambulatory out of the department.  

## 2014-04-13 NOTE — ED Notes (Signed)
Patient c/o left ankle pain and "slight" left knee pain. Per patient was pulling leaves on tarp when left knee "gave out" causing him to twist ankle and fall onto buttock. Denies hitting head. Per patient has had a total knee replacement on both knees in which patient states "will give out occasionally."

## 2014-04-13 NOTE — Discharge Instructions (Signed)
There does not appear to be any broken bones. The best treatment for this is rest, elevation, and use your brace, to help support the ankle joint. We are unable to prescribe narcotic medications for this problem. The best treatment for this would be ibuprofen, but if it bothers your stomach, then you should use Tylenol, to help the pain.    Ankle Pain Ankle pain is a common symptom. The bones, cartilage, tendons, and muscles of the ankle joint perform a lot of work each day. The ankle joint holds your body weight and allows you to move around. Ankle pain can occur on either side or back of 1 or both ankles. Ankle pain may be sharp and burning or dull and aching. There may be tenderness, stiffness, redness, or warmth around the ankle. The pain occurs more often when a person walks or puts pressure on the ankle. CAUSES  There are many reasons ankle pain can develop. It is important to work with your caregiver to identify the cause since many conditions can impact the bones, cartilage, muscles, and tendons. Causes for ankle pain include:  Injury, including a break (fracture), sprain, or strain often due to a fall, sports, or a high-impact activity.  Swelling (inflammation) of a tendon (tendonitis).  Achilles tendon rupture.  Ankle instability after repeated sprains and strains.  Poor foot alignment.  Pressure on a nerve (tarsal tunnel syndrome).  Arthritis in the ankle or the lining of the ankle.  Crystal formation in the ankle (gout or pseudogout). DIAGNOSIS  A diagnosis is based on your medical history, your symptoms, results of your physical exam, and results of diagnostic tests. Diagnostic tests may include X-ray exams or a computerized magnetic scan (magnetic resonance imaging, MRI). TREATMENT  Treatment will depend on the cause of your ankle pain and may include:  Keeping pressure off the ankle and limiting activities.  Using crutches or other walking support (a cane or  brace).  Using rest, ice, compression, and elevation.  Participating in physical therapy or home exercises.  Wearing shoe inserts or special shoes.  Losing weight.  Taking medications to reduce pain or swelling or receiving an injection.  Undergoing surgery. HOME CARE INSTRUCTIONS   Only take over-the-counter or prescription medicines for pain, discomfort, or fever as directed by your caregiver.  Put ice on the injured area.  Put ice in a plastic bag.  Place a towel between your skin and the bag.  Leave the ice on for 15-20 minutes at a time, 03-04 times a day.  Keep your leg raised (elevated) when possible to lessen swelling.  Avoid activities that cause ankle pain.  Follow specific exercises as directed by your caregiver.  Record how often you have ankle pain, the location of the pain, and what it feels like. This information may be helpful to you and your caregiver.  Ask your caregiver about returning to work or sports and whether you should drive.  Follow up with your caregiver for further examination, therapy, or testing as directed. SEEK MEDICAL CARE IF:   Pain or swelling continues or worsens beyond 1 week.  You have an oral temperature above 102 F (38.9 C).  You are feeling unwell or have chills.  You are having an increasingly difficult time with walking.  You have loss of sensation or other new symptoms.  You have questions or concerns. MAKE SURE YOU:   Understand these instructions.  Will watch your condition.  Will get help right away if you are not  doing well or get worse. Document Released: 06/25/2009 Document Revised: 03/30/2011 Document Reviewed: 06/25/2009 [facility] Patient Information 2015 [place], [place]. This information is not intended to replace advice given to you by your health care provider. Make sure you discuss any questions you have with your health care provider.

## 2014-04-16 ENCOUNTER — Ambulatory Visit: Payer: Self-pay | Admitting: Internal Medicine

## 2014-05-01 ENCOUNTER — Encounter (HOSPITAL_COMMUNITY): Payer: Self-pay

## 2014-05-01 ENCOUNTER — Emergency Department (HOSPITAL_COMMUNITY)
Admission: EM | Admit: 2014-05-01 | Discharge: 2014-05-02 | Disposition: A | Discharge status: Home / Self Care | Payer: Medicare Other | Attending: Emergency Medicine | Admitting: Emergency Medicine

## 2014-05-01 ENCOUNTER — Emergency Department (HOSPITAL_COMMUNITY): Payer: Medicare Other

## 2014-05-01 DIAGNOSIS — S4991XA Unspecified injury of right shoulder and upper arm, initial encounter: Secondary | ICD-10-CM | POA: Insufficient documentation

## 2014-05-01 DIAGNOSIS — Z872 Personal history of diseases of the skin and subcutaneous tissue: Secondary | ICD-10-CM | POA: Insufficient documentation

## 2014-05-01 DIAGNOSIS — M25572 Pain in left ankle and joints of left foot: Secondary | ICD-10-CM

## 2014-05-01 DIAGNOSIS — M1990 Unspecified osteoarthritis, unspecified site: Secondary | ICD-10-CM | POA: Diagnosis not present

## 2014-05-01 DIAGNOSIS — S8992XA Unspecified injury of left lower leg, initial encounter: Secondary | ICD-10-CM | POA: Diagnosis not present

## 2014-05-01 DIAGNOSIS — E119 Type 2 diabetes mellitus without complications: Secondary | ICD-10-CM | POA: Insufficient documentation

## 2014-05-01 DIAGNOSIS — M25511 Pain in right shoulder: Secondary | ICD-10-CM

## 2014-05-01 DIAGNOSIS — F419 Anxiety disorder, unspecified: Secondary | ICD-10-CM | POA: Diagnosis not present

## 2014-05-01 DIAGNOSIS — Z8669 Personal history of other diseases of the nervous system and sense organs: Secondary | ICD-10-CM | POA: Insufficient documentation

## 2014-05-01 DIAGNOSIS — Z8673 Personal history of transient ischemic attack (TIA), and cerebral infarction without residual deficits: Secondary | ICD-10-CM | POA: Diagnosis not present

## 2014-05-01 DIAGNOSIS — Z720 Tobacco use: Secondary | ICD-10-CM | POA: Diagnosis not present

## 2014-05-01 DIAGNOSIS — F329 Major depressive disorder, single episode, unspecified: Secondary | ICD-10-CM | POA: Insufficient documentation

## 2014-05-01 DIAGNOSIS — W19XXXA Unspecified fall, initial encounter: Secondary | ICD-10-CM

## 2014-05-01 DIAGNOSIS — Y9389 Activity, other specified: Secondary | ICD-10-CM | POA: Insufficient documentation

## 2014-05-01 DIAGNOSIS — Y9289 Other specified places as the place of occurrence of the external cause: Secondary | ICD-10-CM | POA: Insufficient documentation

## 2014-05-01 DIAGNOSIS — S99911A Unspecified injury of right ankle, initial encounter: Secondary | ICD-10-CM | POA: Diagnosis not present

## 2014-05-01 DIAGNOSIS — S99912A Unspecified injury of left ankle, initial encounter: Secondary | ICD-10-CM | POA: Diagnosis not present

## 2014-05-01 DIAGNOSIS — M25562 Pain in left knee: Secondary | ICD-10-CM

## 2014-05-01 DIAGNOSIS — W1789XA Other fall from one level to another, initial encounter: Secondary | ICD-10-CM | POA: Insufficient documentation

## 2014-05-01 DIAGNOSIS — M25571 Pain in right ankle and joints of right foot: Secondary | ICD-10-CM

## 2014-05-01 DIAGNOSIS — Z8701 Personal history of pneumonia (recurrent): Secondary | ICD-10-CM | POA: Diagnosis not present

## 2014-05-01 DIAGNOSIS — Y998 Other external cause status: Secondary | ICD-10-CM | POA: Insufficient documentation

## 2014-05-01 MED ORDER — METHOCARBAMOL 500 MG PO TABS
1000.0000 mg | ORAL_TABLET | Freq: Once | ORAL | Status: AC
  Administered 2014-05-01: 1000 mg via ORAL
  Filled 2014-05-01: qty 2

## 2014-05-01 MED ORDER — ACETAMINOPHEN 500 MG PO TABS
1000.0000 mg | ORAL_TABLET | Freq: Once | ORAL | Status: AC
  Administered 2014-05-01: 1000 mg via ORAL
  Filled 2014-05-01: qty 2

## 2014-05-01 NOTE — ED Notes (Signed)
I fell off of my shed roof and landed on my right side per pt. Patient complaining of pain in right shoulder, right foot, right hand, both knees, and right hip. Denies loss of consciousness. Patient states that he probably fell around 7 feet.

## 2014-05-01 NOTE — ED Provider Notes (Signed)
CSN: [id number]     Arrival date & time 05/01/14  2235 History  This chart was scribed for Javier Rice, MD by [name], ED Scribe. This patient was seen in room APA10/APA10 and the patient's care was started at 11:15 PM.     Chief Complaint  Patient presents with  . Fall   The history is provided by the patient. No language interpreter was used.   HPI Comments: [name] is a 49 y.o. male who presents to the Emergency Department complaining of new fall onset tonight at 7:30 PM. Pt states that he fell off the roof of his shed. Pt states that he fell from a height of about 7 feet landing on his right side. Pt complains of right foot pain, bilateral knee pain, right hand pain, back pain and right shoulder pain. Pt denies any medications PTA. Pt denies LOC. Denies neck pain. No focal weakness or numbness.  Past Medical History  Diagnosis Date  . Diabetic foot ulcer   . ETOH abuse   . Anxiety   . Open wound     bottom of foot  . Diabetes mellitus without complication     borderline  . Neuromuscular disorder     neuropathy  . GERD (gastroesophageal reflux disease)     tums  . Pneumonia ~ 2012  . History of blood transfusion     "related to left knee OR; probably right hip too" (04/21/2012)  . Stroke 2008    "they said I might have had one during right hip replacement" (04/21/2012)  . Arthritis     "everywhere" (04/21/2012)  . Mental disorder   . Depression   . DDD (degenerative disc disease)   . Cellulitis and abscess of foot 12/19/2013    LEFT FOOT   Past Surgical History  Procedure Laterality Date  . Total hip arthroplasty Right 2008  . Total knee arthroplasty Left 2006  . Joint replacement    . Lung lobectomy Left ~ 2006  . Revision total hip arthroplasty Right 2008    "4-5 months after replacement" (04/21/2012)  . Knee arthroscopy Bilateral 1980's/1990's  . Lung lobectomy    . Metatarsal osteotomy  10/29/2011    Procedure: METATARSAL OSTEOTOMY;  Surgeon: [name], MD;  Location: [facility];  Service: Orthopedics;  Laterality: Left;  Left 1st Metatarsal Dorsal Closing Wedge   . Total knee arthroplasty Right 04/20/2012  . Total knee arthroplasty Right 04/20/2012    Procedure: TOTAL KNEE ARTHROPLASTY;  Surgeon: [name], MD;  Location: [place];  Service: Orthopedics;  Laterality: Right;  Right Total Knee Arthroplasty   Family History  Problem Relation Age of Onset  . Diabetes Father    History  Substance Use Topics  . Smoking status: Current Every Day Smoker -- 1.00 packs/day for 30 years    Types: Cigarettes  . Smokeless tobacco: Never Used     Comment: 04/21/2012 offered smoking cessation materials; pt declines  12/19/2013 DECLINES SMOKING CESSATION MATERIALS  . Alcohol Use: No     Comment: last used march 11    Review of Systems  Respiratory: Negative for shortness of breath.   Cardiovascular: Negative for chest pain.  Gastrointestinal: Negative for nausea, vomiting and abdominal pain.  Musculoskeletal: Positive for back pain and arthralgias. Negative for myalgias, neck pain and neck stiffness.  Skin: Negative for rash and wound.  Neurological: Negative for dizziness, syncope, weakness, light-headedness, numbness and headaches.  All other systems reviewed and are negative.  Allergies  Benadryl; Ibuprofen; and Trazodone and nefazodone  Home Medications   Prior to Admission medications   Medication Sig Start Date End Date Taking? Authorizing Provider  Amino Acids-Protein Hydrolys (FEEDING SUPPLEMENT, PRO-STAT SUGAR FREE 64,) LIQD Take 30 mLs by mouth 2 (two) times daily with a meal. Patient not taking: Reported on 02/06/2014 12/22/13   Bobby Rumpf York, PA-C  clonazePAM (KLONOPIN) 1 MG tablet Take 1 mg by mouth 4 (four) times daily.    Historical Provider, MD  escitalopram (LEXAPRO) 20 MG tablet Take 1 tablet (20 mg total) by mouth every morning. For depression 03/29/13   Encarnacion Slates, NP  ferrous sulfate 325 (65 FE) MG tablet Take 1 tablet  (325 mg total) by mouth 2 (two) times daily with a meal. Patient not taking: Reported on 04/13/2014 12/22/13   Melton Alar, PA-C  gabapentin (NEURONTIN) 400 MG capsule Take 1 capsule (400 mg total) by mouth 4 (four) times daily. For substance withdrawal syndrome 03/29/13   Encarnacion Slates, NP  HYDROcodone-acetaminophen (NORCO/VICODIN) 5-325 MG per tablet Take one-two tabs po q 4-6 hrs prn pain Patient not taking: Reported on 04/13/2014 03/25/14   Tammi Triplett, PA-C  metFORMIN (GLUCOPHAGE) 500 MG tablet Take 1 tablet (500 mg total) by mouth 2 (two) times daily with a meal. 01/15/14   Philemon Kingdom, MD  methocarbamol (ROBAXIN) 500 MG tablet Take 2 tablets (1,000 mg total) by mouth every 8 (eight) hours as needed for muscle spasms. 05/02/14   Javier Rice, MD  mirtazapine (REMERON) 30 MG tablet Take 30 mg by mouth at bedtime.    Historical Provider, MD  oxyCODONE-acetaminophen (PERCOCET/ROXICET) 5-325 MG per tablet Take 1 tablet by mouth every 4 (four) hours as needed. Patient not taking: Reported on 04/13/2014 03/10/14   Evalee Jefferson, PA-C  pantoprazole (PROTONIX) 40 MG tablet Take 1 tablet (40 mg total) by mouth daily. Patient not taking: Reported on 01/23/2014 12/22/13   Melton Alar, PA-C  QUEtiapine (SEROQUEL) 200 MG tablet Take 1 tablet (200 mg total) by mouth at bedtime. For mood control 03/29/13   Encarnacion Slates, NP  rifampin (RIFADIN) 300 MG capsule Take 1 capsule (300 mg total) by mouth daily. Patient not taking: Reported on 01/23/2014 12/22/13   Melton Alar, PA-C  sulfamethoxazole-trimethoprim (BACTRIM) 400-80 MG per tablet Take 1 tablet by mouth 2 (two) times daily. Patient not taking: Reported on 04/13/2014 02/06/14   Campbell Riches, MD   BP 155/97 mmHg  Pulse 86  Temp(Src) 97.9 F (36.6 C) (Oral)  Resp 16  Ht 6\' 3"  (1.905 m)  Wt 200 lb (90.719 kg)  BMI 25.00 kg/m2  SpO2 96%   Physical Exam  Constitutional: He is oriented to person, place, and time. He appears well-developed and  well-nourished. No distress.  HENT:  Head: Normocephalic and atraumatic.  Mouth/Throat: Oropharynx is clear and moist.  No head injury present  Eyes: EOM are normal. Pupils are equal, round, and reactive to light.  Neck: Normal range of motion. Neck supple.  No posterior midline cervical tenderness to palpation.  Cardiovascular: Normal rate and regular rhythm.  Exam reveals no gallop and no friction rub.   No murmur heard. Pulmonary/Chest: Effort normal and breath sounds normal. No respiratory distress. He has no wheezes. He has no rales. He exhibits no tenderness.  Abdominal: Soft. Bowel sounds are normal. He exhibits no distension and no mass. There is no tenderness. There is no rebound and no guarding.  Musculoskeletal: Normal range of  motion. He exhibits tenderness. He exhibits no edema.  Patient has full range of motion of bilateral ankles without any obvious swelling or deformity. No tenderness with palpation. Patient has full range of motion of bilateral knees without any obvious effusion or ligamentous instability. Patient does have very mild tenderness to palpation of the lateral surface of the left knee. Patient has diffuse right shoulder tenderness without bony focality. He has full range of motion of the right shoulder. Patient has very mild right hand tenderness. There is no snuffbox tenderness. There is no obvious swelling or deformity. Patient with full range of motion of bilateral hips. No instability. No midline thoracic or lumbar tenderness.  Neurological: He is alert and oriented to person, place, and time.  5/5 motor in all extremities. Sensation is fully intact. Patient's episode without any assistance  Skin: Skin is warm and dry. No rash noted. No erythema.  No abrasion, contusion or other evidence of trauma  Psychiatric: He has a normal mood and affect. His behavior is normal.  Nursing note and vitals reviewed.   ED Course  Procedures (including critical care  time) DIAGNOSTIC STUDIES: Oxygen Saturation is 96% on RA, adequate by my interpretation.    COORDINATION OF CARE: 11:45 PM-Discussed treatment plan with pt at bedside and pt agreed to plan.     Labs Review Labs Reviewed - No data to display  Imaging Review Dg Knee Complete 4 Views Left  05/02/2014   CLINICAL DATA:  Left knee pain after fall from roof. History of knee replacement and chronic knee pain.  EXAM: LEFT KNEE - COMPLETE 4+ VIEW  COMPARISON:  None.  FINDINGS: Total knee arthroplasty which is well seated. There is no periprosthetic fracture or dislocation. No visible joint effusion.  IMPRESSION: Unremarkable left knee post total arthroplasty.   Electronically Signed   By: Monte Fantasia M.D.   On: 05/02/2014 00:40     EKG Interpretation None      MDM   Final diagnoses:  Fall, initial encounter  Left knee pain  Bilateral ankle pain  Right shoulder pain      I personally performed the services described in this documentation, which was scribed in my presence. The recorded information has been reviewed and is accurate.  Patient with story of falling from 7 feet several hours prior to presentation. He has no apparent injuries. His only focal tenderness was in his left knee. X-ray without any acute findings. I do not believe that further imaging is necessary. He is advised to take Tylenol as needed for pain.     Javier Rice, MD 05/02/14 [phone]

## 2014-05-02 MED ORDER — METHOCARBAMOL 500 MG PO TABS: 1000.0000 mg | ORAL_TABLET | Freq: Three times a day (TID) | ORAL | Status: DC | PRN

## 2014-05-02 NOTE — Discharge Instructions (Signed)

## 2014-05-07 ENCOUNTER — Encounter (HOSPITAL_COMMUNITY): Payer: Self-pay | Admitting: *Deleted

## 2014-05-07 ENCOUNTER — Emergency Department (HOSPITAL_COMMUNITY)
Admission: EM | Admit: 2014-05-07 | Discharge: 2014-05-07 | Disposition: A | Discharge status: Home / Self Care | Payer: Medicare Other | Attending: Emergency Medicine | Admitting: Emergency Medicine

## 2014-05-07 DIAGNOSIS — G709 Myoneural disorder, unspecified: Secondary | ICD-10-CM | POA: Insufficient documentation

## 2014-05-07 DIAGNOSIS — Y998 Other external cause status: Secondary | ICD-10-CM | POA: Diagnosis not present

## 2014-05-07 DIAGNOSIS — Y9289 Other specified places as the place of occurrence of the external cause: Secondary | ICD-10-CM | POA: Diagnosis not present

## 2014-05-07 DIAGNOSIS — F419 Anxiety disorder, unspecified: Secondary | ICD-10-CM | POA: Diagnosis not present

## 2014-05-07 DIAGNOSIS — Z79899 Other long term (current) drug therapy: Secondary | ICD-10-CM | POA: Diagnosis not present

## 2014-05-07 DIAGNOSIS — Z872 Personal history of diseases of the skin and subcutaneous tissue: Secondary | ICD-10-CM | POA: Insufficient documentation

## 2014-05-07 DIAGNOSIS — F329 Major depressive disorder, single episode, unspecified: Secondary | ICD-10-CM | POA: Diagnosis not present

## 2014-05-07 DIAGNOSIS — L97509 Non-pressure chronic ulcer of other part of unspecified foot with unspecified severity: Secondary | ICD-10-CM | POA: Insufficient documentation

## 2014-05-07 DIAGNOSIS — Y9389 Activity, other specified: Secondary | ICD-10-CM | POA: Diagnosis not present

## 2014-05-07 DIAGNOSIS — E11621 Type 2 diabetes mellitus with foot ulcer: Secondary | ICD-10-CM | POA: Diagnosis not present

## 2014-05-07 DIAGNOSIS — M1990 Unspecified osteoarthritis, unspecified site: Secondary | ICD-10-CM | POA: Diagnosis not present

## 2014-05-07 DIAGNOSIS — S93402A Sprain of unspecified ligament of left ankle, initial encounter: Secondary | ICD-10-CM | POA: Diagnosis not present

## 2014-05-07 DIAGNOSIS — Z720 Tobacco use: Secondary | ICD-10-CM | POA: Diagnosis not present

## 2014-05-07 DIAGNOSIS — Z8673 Personal history of transient ischemic attack (TIA), and cerebral infarction without residual deficits: Secondary | ICD-10-CM | POA: Insufficient documentation

## 2014-05-07 DIAGNOSIS — W11XXXA Fall on and from ladder, initial encounter: Secondary | ICD-10-CM | POA: Diagnosis not present

## 2014-05-07 DIAGNOSIS — Z8701 Personal history of pneumonia (recurrent): Secondary | ICD-10-CM | POA: Diagnosis not present

## 2014-05-07 DIAGNOSIS — S99912A Unspecified injury of left ankle, initial encounter: Secondary | ICD-10-CM | POA: Diagnosis present

## 2014-05-07 DIAGNOSIS — Z8719 Personal history of other diseases of the digestive system: Secondary | ICD-10-CM | POA: Diagnosis not present

## 2014-05-07 NOTE — ED Notes (Signed)
Patient left after MD discharge instructions, but prior to nursing discharge instructions.

## 2014-05-07 NOTE — Discharge Instructions (Signed)
Ankle Sprain  An ankle sprain is an injury to the strong, fibrous tissues (ligaments) that hold your ankle bones together.   HOME CARE   · Put ice on your ankle for 1-2 days or as told by your doctor.  ¨ Put ice in a plastic bag.  ¨ Place a towel between your skin and the bag.  ¨ Leave the ice on for 15-20 minutes at a time, every 2 hours while you are awake.  · Only take medicine as told by your doctor.  · Raise (elevate) your injured ankle above the level of your heart as much as possible for 2-3 days.  · Use crutches if your doctor tells you to. Slowly put your own weight on the affected ankle. Use the crutches until you can walk without pain.  · If you have a plaster splint:  ¨ Do not rest it on anything harder than a pillow for 24 hours.  ¨ Do not put weight on it.  ¨ Do not get it wet.  ¨ Take it off to shower or bathe.  · If given, use an elastic wrap or support stocking for support. Take the wrap off if your toes lose feeling (numb), tingle, or turn cold or blue.  · If you have an air splint:  ¨ Add or let out air to make it comfortable.  ¨ Take it off at night and to shower and bathe.  ¨ Wiggle your toes and move your ankle up and down often while you are wearing it.  GET HELP IF:  · You have rapidly increasing bruising or puffiness (swelling).  · Your toes feel very cold.  · You lose feeling in your foot.  · Your medicine does not help your pain.  GET HELP RIGHT AWAY IF:   · Your toes lose feeling (numb) or turn blue.  · You have severe pain that is increasing.  MAKE SURE YOU:   · Understand these instructions.  · Will watch your condition.  · Will get help right away if you are not doing well or get worse.  Document Released: 06/24/2007 Document Revised: 05/22/2013 Document Reviewed: 07/20/2011  [employer]® Patient Information ©2015 [employer]. This information is not intended to replace advice given to you by your health care provider. Make sure you discuss any questions you have with your health care  provider.

## 2014-05-07 NOTE — ED Provider Notes (Signed)
CSN: [id number]     Arrival date & time 05/07/14  0056 History   First MD Initiated Contact with Patient 05/07/14 0117     Chief Complaint  Patient presents with  . Fall    Patient is a 49 y.o. male presenting with fall. The history is provided by the patient.  Fall This is a new problem. The current episode started 3 to 5 hours ago. The problem has not changed since onset.Pertinent negatives include no chest pain and no headaches. The symptoms are aggravated by walking. The symptoms are relieved by rest.  pt reports he missed last two steps of a ladder about 3 hrs ago and twisted his left ankle No other injury reported No head injury No new neck or back pain He reports chronic knee pain  Past Medical History  Diagnosis Date  . Diabetic foot ulcer   . ETOH abuse   . Anxiety   . Open wound     bottom of foot  . Diabetes mellitus without complication     borderline  . Neuromuscular disorder     neuropathy  . GERD (gastroesophageal reflux disease)     tums  . Pneumonia ~ 2012  . History of blood transfusion     "related to left knee OR; probably right hip too" (04/21/2012)  . Stroke 2008    "they said I might have had one during right hip replacement" (04/21/2012)  . Arthritis     "everywhere" (04/21/2012)  . Mental disorder   . Depression   . DDD (degenerative disc disease)   . Cellulitis and abscess of foot 12/19/2013    LEFT FOOT   Past Surgical History  Procedure Laterality Date  . Total hip arthroplasty Right 2008  . Total knee arthroplasty Left 2006  . Joint replacement    . Lung lobectomy Left ~ 2006  . Revision total hip arthroplasty Right 2008    "4-5 months after replacement" (04/21/2012)  . Knee arthroscopy Bilateral 1980's/1990's  . Lung lobectomy    . Metatarsal osteotomy  10/29/2011    Procedure: METATARSAL OSTEOTOMY;  Surgeon: [name], MD;  Location: [place];  Service: Orthopedics;  Laterality: Left;  Left 1st Metatarsal Dorsal Closing Wedge   . Total  knee arthroplasty Right 04/20/2012  . Total knee arthroplasty Right 04/20/2012    Procedure: TOTAL KNEE ARTHROPLASTY;  Surgeon: [name], MD;  Location: [place];  Service: Orthopedics;  Laterality: Right;  Right Total Knee Arthroplasty   Family History  Problem Relation Age of Onset  . Diabetes Father    History  Substance Use Topics  . Smoking status: Current Every Day Smoker -- 1.00 packs/day for 30 years    Types: Cigarettes  . Smokeless tobacco: Never Used     Comment: 04/21/2012 offered smoking cessation materials; pt declines  12/19/2013 DECLINES SMOKING CESSATION MATERIALS  . Alcohol Use: No     Comment: last used march 11    Review of Systems  Cardiovascular: Negative for chest pain.  Musculoskeletal: Positive for arthralgias.  Neurological: Negative for headaches.      Allergies  Benadryl; Ibuprofen; and Trazodone and nefazodone  Home Medications   Prior to Admission medications   Medication Sig Start Date End Date Taking? Authorizing Provider  clonazePAM (KLONOPIN) 1 MG tablet Take 1 mg by mouth 4 (four) times daily.    Historical Provider, MD  escitalopram (LEXAPRO) 20 MG tablet Take 1 tablet (20 mg total) by mouth every morning. For depression 03/29/13  Encarnacion Slates, NP  gabapentin (NEURONTIN) 400 MG capsule Take 1 capsule (400 mg total) by mouth 4 (four) times daily. For substance withdrawal syndrome 03/29/13   Encarnacion Slates, NP  HYDROcodone-acetaminophen (NORCO/VICODIN) 5-325 MG per tablet Take one-two tabs po q 4-6 hrs prn pain Patient not taking: Reported on 04/13/2014 03/25/14   Tammy Triplett, PA-C  metFORMIN (GLUCOPHAGE) 500 MG tablet Take 1 tablet (500 mg total) by mouth 2 (two) times daily with a meal. 01/15/14   Philemon Kingdom, MD  methocarbamol (ROBAXIN) 500 MG tablet Take 2 tablets (1,000 mg total) by mouth every 8 (eight) hours as needed for muscle spasms. 05/02/14   [name], MD  mirtazapine (REMERON) 30 MG tablet Take 30 mg by mouth at bedtime.     Historical Provider, MD  QUEtiapine (SEROQUEL) 200 MG tablet Take 1 tablet (200 mg total) by mouth at bedtime. For mood control 03/29/13   Encarnacion Slates, NP   BP 166/99 mmHg  Pulse 77  Temp(Src) 97.9 F (36.6 C) (Oral)  Resp 24  Ht 6\' 3"  (1.905 m)  Wt 200 lb (90.719 kg)  BMI 25.00 kg/m2  SpO2 100% Physical Exam CONSTITUTIONAL: Well developed/well nourished HEAD: Normocephalic/atraumatic ENMT: Mucous membranes moist NECK: supple no meningeal signs SPINE/BACK:entire spine nontender CV: S1/S2 noted, no murmurs/rubs/gallops noted LUNGS: Lungs are clear to auscultation bilaterally, no apparent distress ABDOMEN: soft, nontender, no rebound or guarding, bowel sounds noted throughout abdomen NEURO: Pt is awake/alert/appropriate, moves all extremitiesx4. Pt is ambulatory EXTREMITIES: pulses normal/equal, full ROM.  Mild tenderness to left medial/lateral malleolus but no edema/bruising noted.  Left  Achilles intact.  He can fully plantar/dorsi flex left foot without difficulty.  No left foot tenderness/bruising noted SKIN: warm, color normal. Scattered healing blisters to plantar surface of right foot without erythema/drainage   ED Course  Procedures Pt without signs of trauma Pt has had 10 ER visits in past 6 months with multiple visits for fall/ankle pain Defer imaging as no signs of trauma Advised patient to f/u as outpatient for management of his pain  MDM   Final diagnoses:  Fall from ladder, initial encounter  Sprain of left ankle, initial encounter    Nursing notes including past medical history and social history reviewed and considered in documentation Previous records reviewed and considered Narcotic database reviewed and considered in decision making     Ripley Fraise, MD 05/07/14 0225

## 2014-05-07 NOTE — ED Notes (Signed)
Pt states he fell off a ladder earlier this evening and twisted his left ankle; pt also c/o blisters to right foot; pt has abrasion to outside right heel

## 2014-06-01 ENCOUNTER — Emergency Department (HOSPITAL_COMMUNITY)
Admission: EM | Admit: 2014-06-01 | Discharge: 2014-06-01 | Disposition: A | Discharge status: Home / Self Care | Payer: Medicare Other | Attending: Emergency Medicine | Admitting: Emergency Medicine

## 2014-06-01 ENCOUNTER — Emergency Department (HOSPITAL_COMMUNITY): Payer: Medicare Other

## 2014-06-01 ENCOUNTER — Encounter (HOSPITAL_COMMUNITY): Payer: Self-pay | Admitting: *Deleted

## 2014-06-01 DIAGNOSIS — F329 Major depressive disorder, single episode, unspecified: Secondary | ICD-10-CM | POA: Diagnosis not present

## 2014-06-01 DIAGNOSIS — E119 Type 2 diabetes mellitus without complications: Secondary | ICD-10-CM | POA: Insufficient documentation

## 2014-06-01 DIAGNOSIS — Z8719 Personal history of other diseases of the digestive system: Secondary | ICD-10-CM | POA: Diagnosis not present

## 2014-06-01 DIAGNOSIS — Z872 Personal history of diseases of the skin and subcutaneous tissue: Secondary | ICD-10-CM | POA: Insufficient documentation

## 2014-06-01 DIAGNOSIS — Y9389 Activity, other specified: Secondary | ICD-10-CM | POA: Insufficient documentation

## 2014-06-01 DIAGNOSIS — Z8673 Personal history of transient ischemic attack (TIA), and cerebral infarction without residual deficits: Secondary | ICD-10-CM | POA: Insufficient documentation

## 2014-06-01 DIAGNOSIS — X58XXXA Exposure to other specified factors, initial encounter: Secondary | ICD-10-CM | POA: Diagnosis not present

## 2014-06-01 DIAGNOSIS — F419 Anxiety disorder, unspecified: Secondary | ICD-10-CM | POA: Diagnosis not present

## 2014-06-01 DIAGNOSIS — Z720 Tobacco use: Secondary | ICD-10-CM | POA: Insufficient documentation

## 2014-06-01 DIAGNOSIS — Y9289 Other specified places as the place of occurrence of the external cause: Secondary | ICD-10-CM | POA: Insufficient documentation

## 2014-06-01 DIAGNOSIS — Y998 Other external cause status: Secondary | ICD-10-CM | POA: Diagnosis not present

## 2014-06-01 DIAGNOSIS — Z8701 Personal history of pneumonia (recurrent): Secondary | ICD-10-CM | POA: Diagnosis not present

## 2014-06-01 DIAGNOSIS — M1990 Unspecified osteoarthritis, unspecified site: Secondary | ICD-10-CM | POA: Diagnosis not present

## 2014-06-01 DIAGNOSIS — S8391XA Sprain of unspecified site of right knee, initial encounter: Secondary | ICD-10-CM | POA: Insufficient documentation

## 2014-06-01 DIAGNOSIS — S8991XA Unspecified injury of right lower leg, initial encounter: Secondary | ICD-10-CM | POA: Diagnosis present

## 2014-06-01 DIAGNOSIS — W19XXXA Unspecified fall, initial encounter: Secondary | ICD-10-CM

## 2014-06-01 LAB — CBG MONITORING, ED: GLUCOSE-CAPILLARY: 267 mg/dL — AB (ref 65–99)

## 2014-06-01 MED ORDER — OXYCODONE-ACETAMINOPHEN 5-325 MG PO TABS
1.0000 | ORAL_TABLET | Freq: Once | ORAL | Status: AC
  Administered 2014-06-01: 1 via ORAL
  Filled 2014-06-01: qty 1

## 2014-06-01 NOTE — ED Notes (Signed)
Stepped in a ditch and twisted rt knee, abrasion present.

## 2014-06-01 NOTE — Discharge Instructions (Signed)
Your x-ray today shows no acute injury to the knee. Wear the knee immobilizer for comfort. Apply ice to the area. Follow up with Dr. Sharol Given. Take tylenol as needed for pain.

## 2014-06-01 NOTE — ED Provider Notes (Signed)
CSN: [id number]     Arrival date & time 06/01/14  1658 History   First MD Initiated Contact with Patient 06/01/14 1715     Chief Complaint  Patient presents with  . Fall     (Consider location/radiation/quality/duration/timing/severity/associated sxs/prior Treatment) Patient is a 49 y.o. male presenting with knee pain. The history is provided by the patient.  Knee Pain Location:  Knee Time since incident:  2 hours Injury: yes   Mechanism of injury comment:  Twisting Knee location:  R knee Pain details:    Quality:  Shooting and aching   Radiates to:  Does not radiate   Severity:  Severe   Timing:  Constant   Progression:  Worsening Chronicity:  New Dislocation: no   Foreign body present:  No foreign bodies Prior injury to area:  Yes Worsened by:  Bearing weight Ineffective treatments:  None tried Associated symptoms: decreased ROM and swelling    [name] is a 49 y.o. male with hx of bilateral knee replacement presents to the ED with right knee pain that started today after he stepped in a ditch and twisted his knee. He has abrasions to the knee.   Past Medical History  Diagnosis Date  . Diabetic foot ulcer   . ETOH abuse   . Anxiety   . Open wound     bottom of foot  . Diabetes mellitus without complication     borderline  . Neuromuscular disorder     neuropathy  . GERD (gastroesophageal reflux disease)     tums  . Pneumonia ~ 2012  . History of blood transfusion     "related to left knee OR; probably right hip too" (04/21/2012)  . Stroke 2008    "they said I might have had one during right hip replacement" (04/21/2012)  . Arthritis     "everywhere" (04/21/2012)  . Mental disorder   . Depression   . DDD (degenerative disc disease)   . Cellulitis and abscess of foot 12/19/2013    LEFT FOOT   Past Surgical History  Procedure Laterality Date  . Total hip arthroplasty Right 2008  . Total knee arthroplasty Left 2006  . Joint replacement    . Lung lobectomy  Left ~ 2006  . Revision total hip arthroplasty Right 2008    "4-5 months after replacement" (04/21/2012)  . Knee arthroscopy Bilateral 1980's/1990's  . Lung lobectomy    . Metatarsal osteotomy  10/29/2011    Procedure: METATARSAL OSTEOTOMY;  Surgeon: [name], MD;  Location: [place];  Service: Orthopedics;  Laterality: Left;  Left 1st Metatarsal Dorsal Closing Wedge   . Total knee arthroplasty Right 04/20/2012  . Total knee arthroplasty Right 04/20/2012    Procedure: TOTAL KNEE ARTHROPLASTY;  Surgeon: [name], MD;  Location: [place];  Service: Orthopedics;  Laterality: Right;  Right Total Knee Arthroplasty   Family History  Problem Relation Age of Onset  . Diabetes Father    History  Substance Use Topics  . Smoking status: Current Every Day Smoker -- 1.00 packs/day for 30 years    Types: Cigarettes  . Smokeless tobacco: Never Used     Comment: 04/21/2012 offered smoking cessation materials; pt declines  12/19/2013 DECLINES SMOKING CESSATION MATERIALS  . Alcohol Use: No     Comment: last used march 11    Review of Systems Negative except as stated in HPI   Allergies  Benadryl; Ibuprofen; and Trazodone and nefazodone  Home Medications   Prior to Admission  medications   Medication Sig Start Date End Date Taking? Authorizing Provider  clonazePAM (KLONOPIN) 1 MG tablet Take 1 mg by mouth 4 (four) times daily.    Historical Provider, MD  escitalopram (LEXAPRO) 20 MG tablet Take 1 tablet (20 mg total) by mouth every morning. For depression 03/29/13   Encarnacion Slates, NP  gabapentin (NEURONTIN) 400 MG capsule Take 1 capsule (400 mg total) by mouth 4 (four) times daily. For substance withdrawal syndrome 03/29/13   Encarnacion Slates, NP  HYDROcodone-acetaminophen (NORCO/VICODIN) 5-325 MG per tablet Take one-two tabs po q 4-6 hrs prn pain Patient not taking: Reported on 04/13/2014 03/25/14   Tammy Triplett, PA-C  metFORMIN (GLUCOPHAGE) 500 MG tablet Take 1 tablet (500 mg total) by mouth 2 (two)  times daily with a meal. 01/15/14   Philemon Kingdom, MD  methocarbamol (ROBAXIN) 500 MG tablet Take 2 tablets (1,000 mg total) by mouth every 8 (eight) hours as needed for muscle spasms. 05/02/14   [name], MD  mirtazapine (REMERON) 30 MG tablet Take 30 mg by mouth at bedtime.    Historical Provider, MD  QUEtiapine (SEROQUEL) 200 MG tablet Take 1 tablet (200 mg total) by mouth at bedtime. For mood control 03/29/13   Encarnacion Slates, NP   BP 164/86 mmHg  Pulse 74  Temp(Src) 97.7 F (36.5 C) (Oral)  Resp 20  Ht 6\' 3"  (1.905 m)  Wt 200 lb (90.719 kg)  BMI 25.00 kg/m2  SpO2 100% Physical Exam  Constitutional: He is oriented to person, place, and time. He appears well-developed and well-nourished.  HENT:  Head: Normocephalic.  Eyes: EOM are normal.  Neck: Neck supple.  Cardiovascular: Normal rate.   Pulmonary/Chest: Effort normal.  Abdominal: Soft. There is no tenderness.  Musculoskeletal:       Right knee: He exhibits normal alignment and normal patellar mobility. Decreased range of motion: due to pain. Swelling: minimal. Lacerations: abrasions. Tenderness found. MCL tenderness noted.       Legs: Pedal pulses 2+ bilateral, pain with flexion of the knee and with internal rotation.   Neurological: He is alert and oriented to person, place, and time. No cranial nerve deficit.  Skin: Skin is warm and dry.  Psychiatric: He has a normal mood and affect. His behavior is normal.  Nursing note and vitals reviewed.   ED Course  Procedures (including critical care time) Labs Review Labs Reviewed  CBG MONITORING, ED - Abnormal; Notable for the following:    Glucose-Capillary 267 (*)    All other components within normal limits    Imaging Review Dg Knee Complete 4 Views Right  06/01/2014   CLINICAL DATA:  Acute right knee pain after twisting injury stepping in hole. Initial encounter.  EXAM: RIGHT KNEE - COMPLETE 4+ VIEW  COMPARISON:  December 03, 2013.  FINDINGS: Status post right  total knee arthroplasty. No fracture or dislocation is noted. No significant joint effusion is noted.  IMPRESSION: No acute abnormality seen in the right knee.   Electronically Signed   By: Marijo Conception, M.D.   On: 06/01/2014 18:16     MDM  49 y.o. male with chronic knee pain here today after a twisting injury to the right knee. Knee immobilizer applied. Ice elevation and follow up with his orthopedic doctor. He will take tylenol as needed for pain as he is allergic to ibuprofen. Stable for d/c without neurovascular compromise. Discussed with the patient and all questioned fully answered.   Final diagnoses:  Right  knee sprain, initial encounter       [facility], NP 06/02/14 0022  Daleen Bo, MD 06/02/14 [id number]

## 2014-06-03 ENCOUNTER — Encounter (HOSPITAL_COMMUNITY): Payer: Self-pay | Admitting: Emergency Medicine

## 2014-06-03 ENCOUNTER — Emergency Department (HOSPITAL_COMMUNITY)
Admission: EM | Admit: 2014-06-03 | Discharge: 2014-06-03 | Disposition: A | Discharge status: Home / Self Care | Payer: Medicare Other | Attending: Emergency Medicine | Admitting: Emergency Medicine

## 2014-06-03 DIAGNOSIS — M25561 Pain in right knee: Secondary | ICD-10-CM

## 2014-06-03 DIAGNOSIS — M1990 Unspecified osteoarthritis, unspecified site: Secondary | ICD-10-CM | POA: Insufficient documentation

## 2014-06-03 DIAGNOSIS — Z87828 Personal history of other (healed) physical injury and trauma: Secondary | ICD-10-CM | POA: Insufficient documentation

## 2014-06-03 DIAGNOSIS — F419 Anxiety disorder, unspecified: Secondary | ICD-10-CM | POA: Insufficient documentation

## 2014-06-03 DIAGNOSIS — G8929 Other chronic pain: Secondary | ICD-10-CM | POA: Insufficient documentation

## 2014-06-03 DIAGNOSIS — Z720 Tobacco use: Secondary | ICD-10-CM | POA: Diagnosis not present

## 2014-06-03 DIAGNOSIS — Z872 Personal history of diseases of the skin and subcutaneous tissue: Secondary | ICD-10-CM | POA: Diagnosis not present

## 2014-06-03 DIAGNOSIS — M25461 Effusion, right knee: Secondary | ICD-10-CM | POA: Diagnosis not present

## 2014-06-03 DIAGNOSIS — F329 Major depressive disorder, single episode, unspecified: Secondary | ICD-10-CM | POA: Insufficient documentation

## 2014-06-03 DIAGNOSIS — Z8701 Personal history of pneumonia (recurrent): Secondary | ICD-10-CM | POA: Insufficient documentation

## 2014-06-03 DIAGNOSIS — Z96651 Presence of right artificial knee joint: Secondary | ICD-10-CM | POA: Insufficient documentation

## 2014-06-03 DIAGNOSIS — Z8719 Personal history of other diseases of the digestive system: Secondary | ICD-10-CM | POA: Insufficient documentation

## 2014-06-03 DIAGNOSIS — Z8673 Personal history of transient ischemic attack (TIA), and cerebral infarction without residual deficits: Secondary | ICD-10-CM | POA: Diagnosis not present

## 2014-06-03 DIAGNOSIS — E119 Type 2 diabetes mellitus without complications: Secondary | ICD-10-CM | POA: Insufficient documentation

## 2014-06-03 DIAGNOSIS — Z79899 Other long term (current) drug therapy: Secondary | ICD-10-CM | POA: Insufficient documentation

## 2014-06-03 MED ORDER — OXYCODONE-ACETAMINOPHEN 5-325 MG PO TABS: 1.0000 | ORAL_TABLET | ORAL | Status: DC | PRN

## 2014-06-03 MED ORDER — OXYCODONE-ACETAMINOPHEN 5-325 MG PO TABS
2.0000 | ORAL_TABLET | Freq: Once | ORAL | Status: AC
Start: 2014-06-03 — End: 2014-06-03
  Administered 2014-06-03: 2 via ORAL
  Filled 2014-06-03: qty 2

## 2014-06-03 NOTE — Discharge Instructions (Signed)

## 2014-06-03 NOTE — ED Notes (Signed)
Patient reports he stepped into a ditch and twisted knee on Friday. Complaining of increasing pain and bruising since then.

## 2014-06-04 NOTE — ED Provider Notes (Signed)
CSN: [id number]     Arrival date & time 06/03/14  1954 History   First MD Initiated Contact with Patient 06/03/14 2050     Chief Complaint  Patient presents with  . Knee Pain     (Consider location/radiation/quality/duration/timing/severity/associated sxs/prior Treatment) The history is provided by the patient.   [name] is a 49 y.o. male with a history of chronic intermittent bilateral knee pain associated with bilateral total knee arthroplasty procedures.  He reports multiple episodes of joint effusions since his most recent right knee surgical revision 2 years ago.  He stepped in a hole involving a twisting injury to the knee while walking 2 days ago and reports persistent pain and now swelling which has not improved despite rest, ice and tylenol.  He is able to bear weight but with increasing discomfort and presents with a knee immobilizer in place.  He had negative xrays when seen for this injury 2 days ago.  He is scheduled to see his orthopedist in 3 days, is requesting pain control until can get definitive care by his orthopedist.       Past Medical History  Diagnosis Date  . Diabetic foot ulcer   . ETOH abuse   . Anxiety   . Open wound     bottom of foot  . Diabetes mellitus without complication     borderline  . Neuromuscular disorder     neuropathy  . GERD (gastroesophageal reflux disease)     tums  . Pneumonia ~ 2012  . History of blood transfusion     "related to left knee OR; probably right hip too" (04/21/2012)  . Stroke 2008    "they said I might have had one during right hip replacement" (04/21/2012)  . Arthritis     "everywhere" (04/21/2012)  . Mental disorder   . Depression   . DDD (degenerative disc disease)   . Cellulitis and abscess of foot 12/19/2013    LEFT FOOT   Past Surgical History  Procedure Laterality Date  . Total hip arthroplasty Right 2008  . Total knee arthroplasty Left 2006  . Joint replacement    . Lung lobectomy Left ~ 2006  .  Revision total hip arthroplasty Right 2008    "4-5 months after replacement" (04/21/2012)  . Knee arthroscopy Bilateral 1980's/1990's  . Lung lobectomy    . Metatarsal osteotomy  10/29/2011    Procedure: METATARSAL OSTEOTOMY;  Surgeon: [name], MD;  Location: [place];  Service: Orthopedics;  Laterality: Left;  Left 1st Metatarsal Dorsal Closing Wedge   . Total knee arthroplasty Right 04/20/2012  . Total knee arthroplasty Right 04/20/2012    Procedure: TOTAL KNEE ARTHROPLASTY;  Surgeon: [name], MD;  Location: [place];  Service: Orthopedics;  Laterality: Right;  Right Total Knee Arthroplasty   Family History  Problem Relation Age of Onset  . Diabetes Father    History  Substance Use Topics  . Smoking status: Current Every Day Smoker -- 1.00 packs/day for 30 years    Types: Cigarettes  . Smokeless tobacco: Never Used     Comment: 04/21/2012 offered smoking cessation materials; pt declines  12/19/2013 DECLINES SMOKING CESSATION MATERIALS  . Alcohol Use: No     Comment: last used march 11    Review of Systems  Constitutional: Negative for fever.  Musculoskeletal: Positive for joint swelling and arthralgias. Negative for myalgias.  Neurological: Negative for weakness and numbness.      Allergies  Benadryl; Ibuprofen; and Trazodone and  nefazodone  Home Medications   Prior to Admission medications   Medication Sig Start Date End Date Taking? Authorizing Provider  clonazePAM (KLONOPIN) 1 MG tablet Take 1 mg by mouth 4 (four) times daily.    Historical Provider, MD  escitalopram (LEXAPRO) 20 MG tablet Take 1 tablet (20 mg total) by mouth every morning. For depression 03/29/13   Encarnacion Slates, NP  gabapentin (NEURONTIN) 400 MG capsule Take 1 capsule (400 mg total) by mouth 4 (four) times daily. For substance withdrawal syndrome 03/29/13   Encarnacion Slates, NP  HYDROcodone-acetaminophen (NORCO/VICODIN) 5-325 MG per tablet Take one-two tabs po q 4-6 hrs prn pain Patient not taking: Reported  on 04/13/2014 03/25/14   Tammy Triplett, PA-C  metFORMIN (GLUCOPHAGE) 500 MG tablet Take 1 tablet (500 mg total) by mouth 2 (two) times daily with a meal. 01/15/14   Philemon Kingdom, MD  methocarbamol (ROBAXIN) 500 MG tablet Take 2 tablets (1,000 mg total) by mouth every 8 (eight) hours as needed for muscle spasms. 05/02/14   [name], MD  mirtazapine (REMERON) 30 MG tablet Take 30 mg by mouth at bedtime.    Historical Provider, MD  oxyCODONE-acetaminophen (PERCOCET/ROXICET) 5-325 MG per tablet Take 1 tablet by mouth every 4 (four) hours as needed. 06/03/14   Evalee Jefferson, PA-C  QUEtiapine (SEROQUEL) 200 MG tablet Take 1 tablet (200 mg total) by mouth at bedtime. For mood control 03/29/13   Encarnacion Slates, NP   BP 182/85 mmHg  Pulse 90  Temp(Src) 98.3 F (36.8 C) (Oral)  Resp 20  Ht 6\' 3"  (1.905 m)  Wt 200 lb (90.719 kg)  BMI 25.00 kg/m2  SpO2 97% Physical Exam  Constitutional: He appears well-developed and well-nourished.  HENT:  Head: Atraumatic.  Neck: Normal range of motion.  Cardiovascular:  Pulses:      Dorsalis pedis pulses are 2+ on the right side, and 2+ on the left side.  Pulses equal bilaterally  Musculoskeletal: He exhibits tenderness.       Right knee: He exhibits effusion. He exhibits no erythema, normal alignment, no LCL laxity and no MCL laxity. Tenderness found. Medial joint line and MCL tenderness noted.  ttp along medial knee.  Small effusion appreciated. Abrasion anterior knee, no signs suggesting skin infection.  Neurological: He is alert. He has normal strength. He displays normal reflexes. No sensory deficit.  Skin: Skin is warm and dry.  Psychiatric: He has a normal mood and affect.    ED Course  Procedures (including critical care time) Labs Review Labs Reviewed - No data to display  Imaging Review No results found.   EKG Interpretation None      MDM   Final diagnoses:  Knee pain, acute, right    Acute on chronic pain.  Pt is a frequent  visitor to this ed, but he has clear exam findings suggesting acute pain.  I do not think he is drug seeking.  He was given oxycodone 2 tabs here (not driving home) and script for 2 days of pain medicine. Advised he needs to f/u with his orthopedist as planned.   Evalee Jefferson, PA-C 06/04/14 1432  Dorie Rank, MD 06/07/14 1200

## 2014-07-03 ENCOUNTER — Encounter (HOSPITAL_COMMUNITY): Payer: Self-pay

## 2014-07-03 ENCOUNTER — Emergency Department (HOSPITAL_COMMUNITY)
Admission: EM | Admit: 2014-07-03 | Discharge: 2014-07-03 | Disposition: A | Discharge status: Home / Self Care | Payer: Medicare Other | Attending: Emergency Medicine | Admitting: Emergency Medicine

## 2014-07-03 DIAGNOSIS — Z8701 Personal history of pneumonia (recurrent): Secondary | ICD-10-CM | POA: Diagnosis not present

## 2014-07-03 DIAGNOSIS — Z9889 Other specified postprocedural states: Secondary | ICD-10-CM | POA: Insufficient documentation

## 2014-07-03 DIAGNOSIS — G709 Myoneural disorder, unspecified: Secondary | ICD-10-CM | POA: Diagnosis not present

## 2014-07-03 DIAGNOSIS — Z9181 History of falling: Secondary | ICD-10-CM | POA: Insufficient documentation

## 2014-07-03 DIAGNOSIS — Z720 Tobacco use: Secondary | ICD-10-CM | POA: Diagnosis not present

## 2014-07-03 DIAGNOSIS — M1990 Unspecified osteoarthritis, unspecified site: Secondary | ICD-10-CM | POA: Diagnosis not present

## 2014-07-03 DIAGNOSIS — W010XXA Fall on same level from slipping, tripping and stumbling without subsequent striking against object, initial encounter: Secondary | ICD-10-CM | POA: Insufficient documentation

## 2014-07-03 DIAGNOSIS — Y998 Other external cause status: Secondary | ICD-10-CM | POA: Diagnosis not present

## 2014-07-03 DIAGNOSIS — Z79899 Other long term (current) drug therapy: Secondary | ICD-10-CM | POA: Diagnosis not present

## 2014-07-03 DIAGNOSIS — Y9389 Activity, other specified: Secondary | ICD-10-CM | POA: Diagnosis not present

## 2014-07-03 DIAGNOSIS — F329 Major depressive disorder, single episode, unspecified: Secondary | ICD-10-CM | POA: Insufficient documentation

## 2014-07-03 DIAGNOSIS — Z8673 Personal history of transient ischemic attack (TIA), and cerebral infarction without residual deficits: Secondary | ICD-10-CM | POA: Diagnosis not present

## 2014-07-03 DIAGNOSIS — Z8719 Personal history of other diseases of the digestive system: Secondary | ICD-10-CM | POA: Diagnosis not present

## 2014-07-03 DIAGNOSIS — S99912A Unspecified injury of left ankle, initial encounter: Secondary | ICD-10-CM | POA: Insufficient documentation

## 2014-07-03 DIAGNOSIS — S8992XA Unspecified injury of left lower leg, initial encounter: Secondary | ICD-10-CM | POA: Diagnosis present

## 2014-07-03 DIAGNOSIS — Z96651 Presence of right artificial knee joint: Secondary | ICD-10-CM | POA: Insufficient documentation

## 2014-07-03 DIAGNOSIS — E11621 Type 2 diabetes mellitus with foot ulcer: Secondary | ICD-10-CM | POA: Diagnosis not present

## 2014-07-03 DIAGNOSIS — M25562 Pain in left knee: Secondary | ICD-10-CM

## 2014-07-03 DIAGNOSIS — L97509 Non-pressure chronic ulcer of other part of unspecified foot with unspecified severity: Secondary | ICD-10-CM | POA: Diagnosis not present

## 2014-07-03 DIAGNOSIS — Y9289 Other specified places as the place of occurrence of the external cause: Secondary | ICD-10-CM | POA: Insufficient documentation

## 2014-07-03 DIAGNOSIS — F419 Anxiety disorder, unspecified: Secondary | ICD-10-CM | POA: Diagnosis not present

## 2014-07-03 DIAGNOSIS — G8929 Other chronic pain: Secondary | ICD-10-CM | POA: Insufficient documentation

## 2014-07-03 LAB — CBG MONITORING, ED: GLUCOSE-CAPILLARY: 137 mg/dL — AB (ref 65–99)

## 2014-07-03 MED ORDER — HYDROCODONE-ACETAMINOPHEN 5-325 MG PO TABS
1.0000 | ORAL_TABLET | Freq: Once | ORAL | Status: AC
  Administered 2014-07-03: 1 via ORAL
  Filled 2014-07-03: qty 1

## 2014-07-03 MED ORDER — HYDROCODONE-ACETAMINOPHEN 5-325 MG PO TABS: 1.0000 | ORAL_TABLET | ORAL | Status: DC | PRN

## 2014-07-03 NOTE — ED Notes (Signed)
Pt reports left knee "went out" and he fell and twisted left knee and left ankle.  C/o pain since then.

## 2014-07-03 NOTE — Discharge Instructions (Signed)

## 2014-07-03 NOTE — ED Provider Notes (Signed)
CSN: [id number]     Arrival date & time 07/03/14  1819 History   First MD Initiated Contact with Patient 07/03/14 1853     Chief Complaint  Patient presents with  . Knee Pain     (Consider location/radiation/quality/duration/timing/severity/associated sxs/prior Treatment) The history is provided by the patient.   [name] is a 49 y.o. male with a history of chronic pain and frequent falls secondary to a longstanding history of osteoarthritis (states diagnosed in his teens) with multiple surgical procedures on his bilateral knees,hips and foot.   His left knee collapsed, he fell twisting the left knee and ankle today. He has no increased swelling in the foot, ankle or knee. He has taken tylenol without relief.  He was able to weight bear after the event.  He is scheduled to see his orthopedist Dr. Sharol Given in 3 days.     Past Medical History  Diagnosis Date  . Diabetic foot ulcer   . ETOH abuse   . Anxiety   . Open wound     bottom of foot  . Diabetes mellitus without complication     borderline  . Neuromuscular disorder     neuropathy  . GERD (gastroesophageal reflux disease)     tums  . Pneumonia ~ 2012  . History of blood transfusion     "related to left knee OR; probably right hip too" (04/21/2012)  . Stroke 2008    "they said I might have had one during right hip replacement" (04/21/2012)  . Arthritis     "everywhere" (04/21/2012)  . Mental disorder   . Depression   . DDD (degenerative disc disease)   . Cellulitis and abscess of foot 12/19/2013    LEFT FOOT   Past Surgical History  Procedure Laterality Date  . Total hip arthroplasty Right 2008  . Total knee arthroplasty Left 2006  . Joint replacement    . Lung lobectomy Left ~ 2006  . Revision total hip arthroplasty Right 2008    "4-5 months after replacement" (04/21/2012)  . Knee arthroscopy Bilateral 1980's/1990's  . Lung lobectomy    . Metatarsal osteotomy  10/29/2011    Procedure: METATARSAL OSTEOTOMY;  Surgeon:  [name], MD;  Location: [place];  Service: Orthopedics;  Laterality: Left;  Left 1st Metatarsal Dorsal Closing Wedge   . Total knee arthroplasty Right 04/20/2012  . Total knee arthroplasty Right 04/20/2012    Procedure: TOTAL KNEE ARTHROPLASTY;  Surgeon: [name], MD;  Location: [place];  Service: Orthopedics;  Laterality: Right;  Right Total Knee Arthroplasty   Family History  Problem Relation Age of Onset  . Diabetes Father    History  Substance Use Topics  . Smoking status: Current Every Day Smoker -- 1.00 packs/day for 30 years    Types: Cigarettes  . Smokeless tobacco: Never Used     Comment: 04/21/2012 offered smoking cessation materials; pt declines  12/19/2013 DECLINES SMOKING CESSATION MATERIALS  . Alcohol Use: No     Comment: last used march 11    Review of Systems  Constitutional: Negative for fever.  Musculoskeletal: Positive for arthralgias. Negative for myalgias and joint swelling.  Neurological: Negative for weakness and numbness.      Allergies  Benadryl; Ibuprofen; and Trazodone and nefazodone  Home Medications   Prior to Admission medications   Medication Sig Start Date End Date Taking? Authorizing Provider  clonazePAM (KLONOPIN) 1 MG tablet Take 1 mg by mouth 4 (four) times daily.    Historical  Provider, MD  escitalopram (LEXAPRO) 20 MG tablet Take 1 tablet (20 mg total) by mouth every morning. For depression 03/29/13   Encarnacion Slates, NP  gabapentin (NEURONTIN) 400 MG capsule Take 1 capsule (400 mg total) by mouth 4 (four) times daily. For substance withdrawal syndrome 03/29/13   Encarnacion Slates, NP  HYDROcodone-acetaminophen (NORCO/VICODIN) 5-325 MG per tablet Take 1 tablet by mouth every 4 (four) hours as needed. 07/03/14   Evalee Jefferson, PA-C  metFORMIN (GLUCOPHAGE) 500 MG tablet Take 1 tablet (500 mg total) by mouth 2 (two) times daily with a meal. 01/15/14   Philemon Kingdom, MD  methocarbamol (ROBAXIN) 500 MG tablet Take 2 tablets (1,000 mg total) by mouth  every 8 (eight) hours as needed for muscle spasms. 05/02/14   [name], MD  mirtazapine (REMERON) 30 MG tablet Take 30 mg by mouth at bedtime.    Historical Provider, MD  oxyCODONE-acetaminophen (PERCOCET/ROXICET) 5-325 MG per tablet Take 1 tablet by mouth every 4 (four) hours as needed. 06/03/14   Evalee Jefferson, PA-C  QUEtiapine (SEROQUEL) 200 MG tablet Take 1 tablet (200 mg total) by mouth at bedtime. For mood control 03/29/13   Encarnacion Slates, NP   BP 152/77 mmHg  Pulse 86  Temp(Src) 97.5 F (36.4 C) (Oral)  Resp 17  Ht 6\' 3"  (1.905 m)  Wt 220 lb (99.791 kg)  BMI 27.50 kg/m2  SpO2 98% Physical Exam  Constitutional: He appears well-developed and well-nourished.  HENT:  Head: Atraumatic.  Neck: Normal range of motion.  Cardiovascular:  Pulses equal bilaterally  Musculoskeletal: He exhibits tenderness.       Left knee: He exhibits swelling. He exhibits no effusion, no ecchymosis, no deformity, no erythema, normal alignment, no LCL laxity, normal patellar mobility and no MCL laxity. Tenderness found. Medial joint line and lateral joint line tenderness noted. No patellar tendon tenderness noted.       Left ankle: He exhibits no swelling, no ecchymosis and no deformity. Tenderness. Lateral malleolus tenderness found. No head of 5th metatarsal and no proximal fibula tenderness found. Achilles tendon normal.  Increased swelling left knee compared to right. No effusion.  Multiple healed surgical scars about both anterior knees.    Neurological: He is alert. He has normal strength. He displays normal reflexes. No sensory deficit.  Skin: Skin is warm and dry.  Psychiatric: He has a normal mood and affect.    ED Course  Procedures (including critical care time) Labs Review Labs Reviewed  CBG MONITORING, ED - Abnormal; Notable for the following:    Glucose-Capillary 137 (*)    All other components within normal limits    Imaging Review No results found.   EKG Interpretation None       MDM   Final diagnoses:  Chronic knee pain, left    Pt with acute on chronic knee and ankle pain, exam is stable with no indication for imaging today.  His left knee is larger than the right, but no effusion, chronic from surgeries, which patient endorses.  He was given a 3 day course of hydrocodone, further management per his orthopedist.   The patient appears reasonably screened and/or stabilized for discharge and I doubt any other medical condition or other [facility] requiring further screening, evaluation, or treatment in the ED at this time prior to discharge.     Evalee Jefferson, PA-C 07/03/14 2146  [place], DO 07/03/14 2258

## 2014-07-04 IMAGING — CR DG KNEE COMPLETE 4+V*R*
4 series · 4 of 4 positions shown · non-contrast
Comparison: Plain films 05/30/2011.

CLINICAL DATA: Fall, pain.

RIGHT KNEE - COMPLETE 4+ VIEW

[view not recorded (1 of 4)]
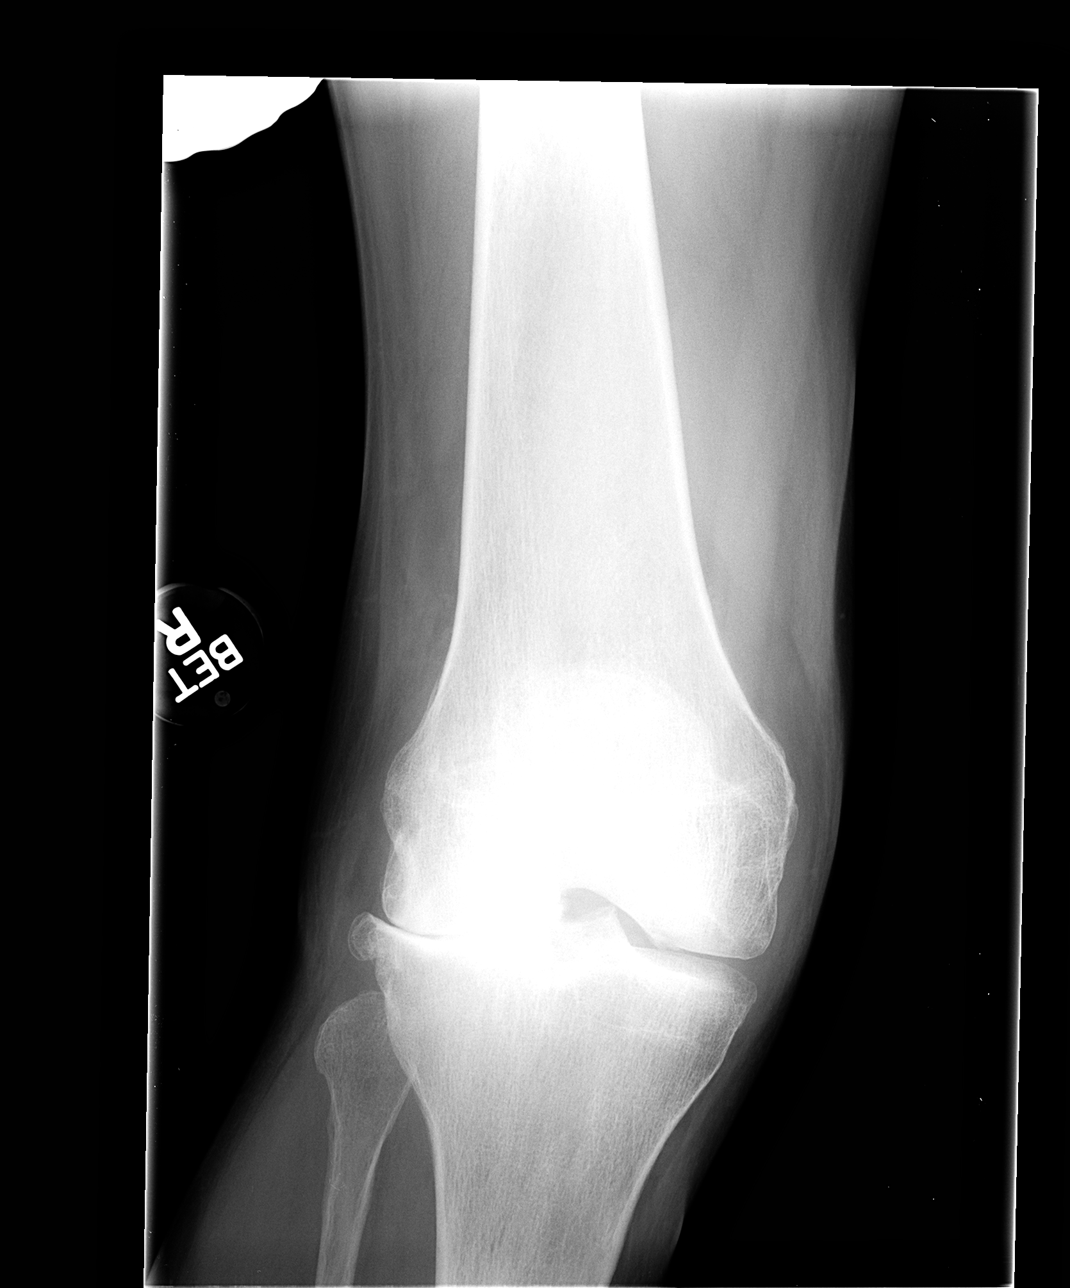

[view not recorded (2 of 4)]
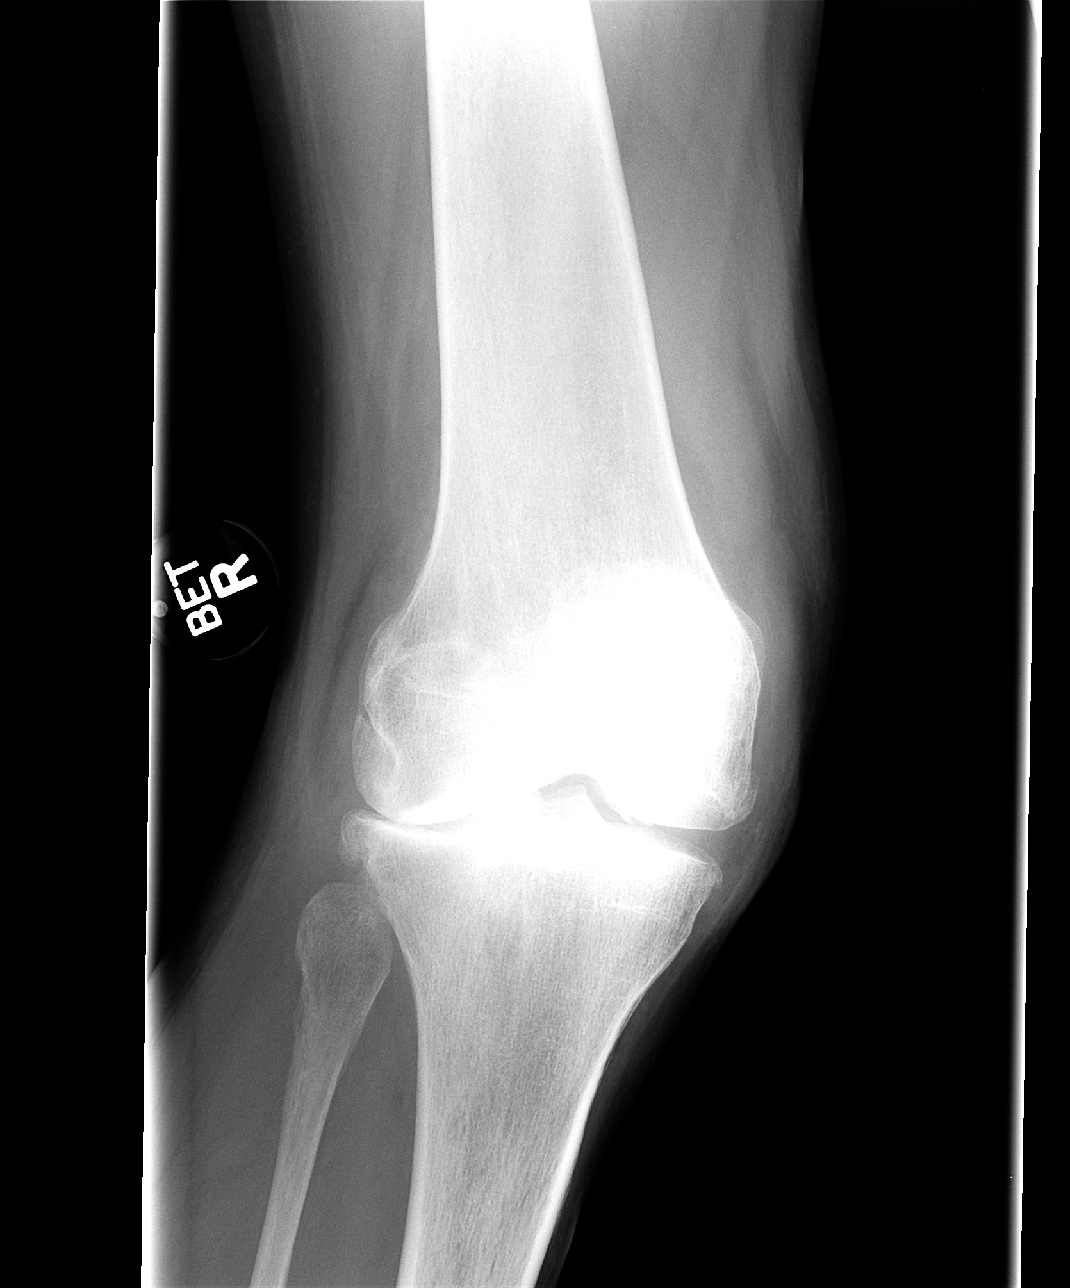

[view not recorded (3 of 4)]
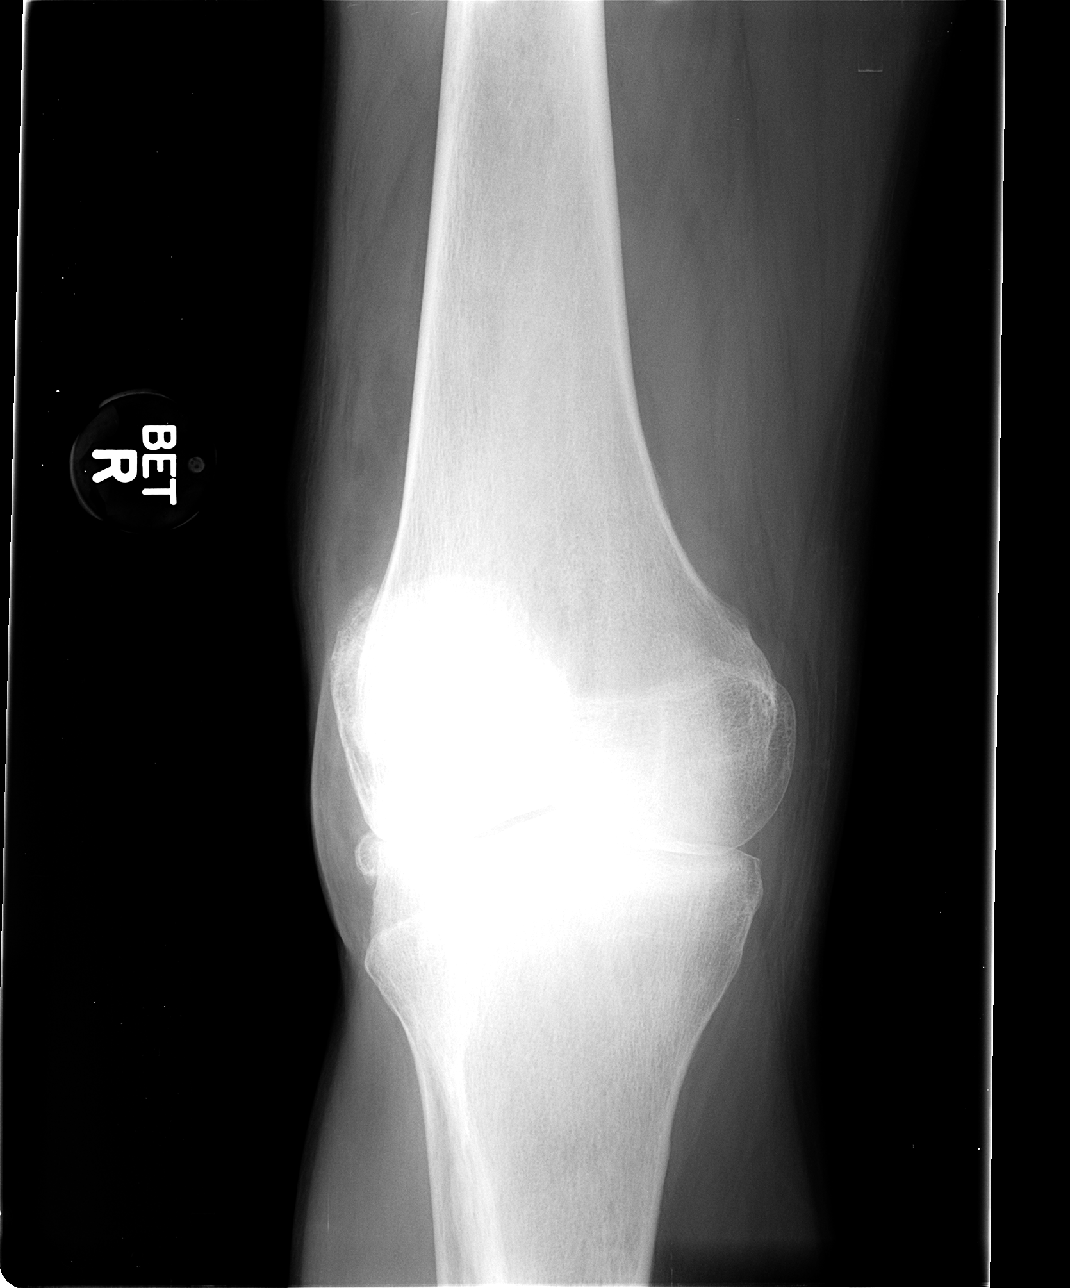

[view not recorded (4 of 4)]
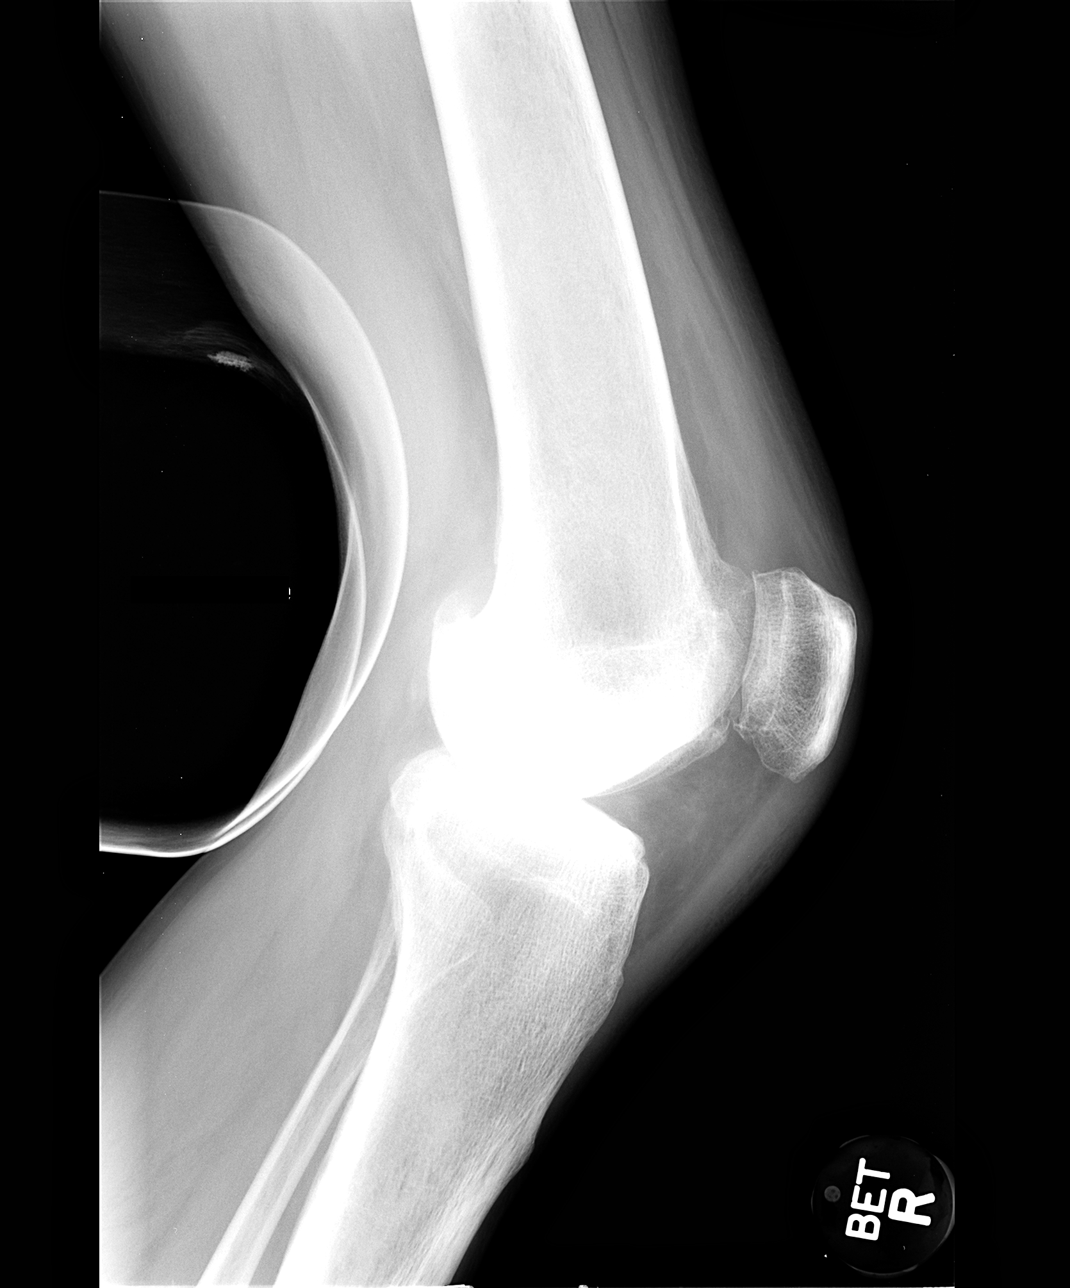

[4 of 4 positions shown; findings below may reference images not displayed]

FINDINGS: There is no acute bony or joint abnormality.  Advance for
age degenerative disease is again seen and appears worst in the
lateral compartment where it is severe.  No joint effusion.
IMPRESSION: No acute finding.  Advanced for age degenerative change, worst in
the lateral compartment.

## 2014-07-16 ENCOUNTER — Other Ambulatory Visit: Payer: Self-pay

## 2014-08-30 ENCOUNTER — Encounter (HOSPITAL_COMMUNITY): Payer: Self-pay | Admitting: *Deleted

## 2014-08-30 ENCOUNTER — Emergency Department (HOSPITAL_COMMUNITY)
Admission: EM | Admit: 2014-08-30 | Discharge: 2014-08-30 | Disposition: A | Discharge status: Home / Self Care | Payer: Medicare Other | Attending: Emergency Medicine | Admitting: Emergency Medicine

## 2014-08-30 DIAGNOSIS — F1910 Other psychoactive substance abuse, uncomplicated: Secondary | ICD-10-CM

## 2014-08-30 DIAGNOSIS — Z79899 Other long term (current) drug therapy: Secondary | ICD-10-CM | POA: Diagnosis not present

## 2014-08-30 DIAGNOSIS — M1990 Unspecified osteoarthritis, unspecified site: Secondary | ICD-10-CM | POA: Diagnosis not present

## 2014-08-30 DIAGNOSIS — Z872 Personal history of diseases of the skin and subcutaneous tissue: Secondary | ICD-10-CM | POA: Insufficient documentation

## 2014-08-30 DIAGNOSIS — Z720 Tobacco use: Secondary | ICD-10-CM | POA: Insufficient documentation

## 2014-08-30 DIAGNOSIS — Z8701 Personal history of pneumonia (recurrent): Secondary | ICD-10-CM | POA: Insufficient documentation

## 2014-08-30 DIAGNOSIS — F329 Major depressive disorder, single episode, unspecified: Secondary | ICD-10-CM | POA: Diagnosis not present

## 2014-08-30 DIAGNOSIS — F1410 Cocaine abuse, uncomplicated: Secondary | ICD-10-CM | POA: Diagnosis not present

## 2014-08-30 DIAGNOSIS — F1310 Sedative, hypnotic or anxiolytic abuse, uncomplicated: Secondary | ICD-10-CM | POA: Insufficient documentation

## 2014-08-30 DIAGNOSIS — Z8669 Personal history of other diseases of the nervous system and sense organs: Secondary | ICD-10-CM | POA: Insufficient documentation

## 2014-08-30 DIAGNOSIS — Z8719 Personal history of other diseases of the digestive system: Secondary | ICD-10-CM | POA: Diagnosis not present

## 2014-08-30 DIAGNOSIS — F1110 Opioid abuse, uncomplicated: Secondary | ICD-10-CM | POA: Diagnosis not present

## 2014-08-30 DIAGNOSIS — R197 Diarrhea, unspecified: Secondary | ICD-10-CM | POA: Insufficient documentation

## 2014-08-30 DIAGNOSIS — Z8673 Personal history of transient ischemic attack (TIA), and cerebral infarction without residual deficits: Secondary | ICD-10-CM | POA: Insufficient documentation

## 2014-08-30 DIAGNOSIS — R110 Nausea: Secondary | ICD-10-CM | POA: Diagnosis present

## 2014-08-30 DIAGNOSIS — E119 Type 2 diabetes mellitus without complications: Secondary | ICD-10-CM | POA: Insufficient documentation

## 2014-08-30 LAB — COMPREHENSIVE METABOLIC PANEL
ALBUMIN: 4.4 g/dL (ref 3.5–5.0)
ALT: 23 U/L (ref 17–63)
ANION GAP: 12 (ref 5–15)
AST: 31 U/L (ref 15–41)
Alkaline Phosphatase: 100 U/L (ref 38–126)
BUN: 11 mg/dL (ref 6–20)
CO2: 21 mmol/L — ABNORMAL LOW (ref 22–32)
CREATININE: 1.03 mg/dL (ref 0.61–1.24)
Calcium: 8.7 mg/dL — ABNORMAL LOW (ref 8.9–10.3)
Chloride: 94 mmol/L — ABNORMAL LOW (ref 101–111)
GFR calc non Af Amer: >60 mL/min (ref >60)
Glucose, Bld: 96 mg/dL (ref 65–99)
Potassium: 3.6 mmol/L (ref 3.5–5.1)
Sodium: 127 mmol/L — ABNORMAL LOW (ref 135–145)
Total Bilirubin: 0.9 mg/dL (ref 0.3–1.2)
Total Protein: 7.7 g/dL (ref 6.5–8.1)

## 2014-08-30 LAB — RAPID URINE DRUG SCREEN, HOSP PERFORMED
Amphetamines: NOT DETECTED
BARBITURATES: NOT DETECTED
BENZODIAZEPINES: POSITIVE — AB
Cocaine: POSITIVE — AB
OPIATES: POSITIVE — AB
Tetrahydrocannabinol: NOT DETECTED

## 2014-08-30 LAB — CBC
HCT: 45 % (ref 39.0–52.0)
Hemoglobin: 15.6 g/dL (ref 13.0–17.0)
MCH: 28.6 pg (ref 26.0–34.0)
MCHC: 34.7 g/dL (ref 30.0–36.0)
MCV: 82.6 fL (ref 78.0–100.0)
Platelets: 119 10*3/uL — ABNORMAL LOW (ref 150–400)
RBC: 5.45 MIL/uL (ref 4.22–5.81)
RDW: 14.7 % (ref 11.5–15.5)
WBC: 8 10*3/uL (ref 4.0–10.5)

## 2014-08-30 LAB — SALICYLATE LEVEL: Salicylate Lvl: <4 mg/dL (ref 2.8–30.0)

## 2014-08-30 LAB — ACETAMINOPHEN LEVEL: Acetaminophen (Tylenol), Serum: <10 ug/mL — ABNORMAL LOW (ref 10–30)

## 2014-08-30 LAB — ETHANOL: Alcohol, Ethyl (B): 10 mg/dL — ABNORMAL HIGH (ref <5)

## 2014-08-30 MED ORDER — LORAZEPAM 1 MG PO TABS
1.0000 mg | ORAL_TABLET | Freq: Once | ORAL | Status: AC
  Administered 2014-08-30: 1 mg via ORAL
  Filled 2014-08-30: qty 1

## 2014-08-30 MED ORDER — PROMETHAZINE HCL 25 MG PO TABS: 25.0000 mg | ORAL_TABLET | Freq: Four times a day (QID) | ORAL | Status: DC | PRN

## 2014-08-30 MED ORDER — ONDANSETRON 8 MG PO TBDP
8.0000 mg | ORAL_TABLET | Freq: Once | ORAL | Status: AC
  Administered 2014-08-30: 8 mg via ORAL
  Filled 2014-08-30: qty 1

## 2014-08-30 MED ORDER — LORAZEPAM 1 MG PO TABS: 1.0000 mg | ORAL_TABLET | Freq: Three times a day (TID) | ORAL | Status: DC | PRN
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
## 2014-08-30 NOTE — Discharge Instructions (Signed)
Meds for anxiety and nausea.  Follow up community mental health resources

## 2014-08-30 NOTE — ED Notes (Signed)
Pt provided wi

## 2014-08-30 NOTE — ED Notes (Signed)
Pt reports he has been on a 2 week binge of drinking ETOH, using cocaine and heroin. Pt states "I'm here for detox". Last drank 2 hours ago. Last used cocaine and heroin last night 08/29/14. Pt reports n/v/d. Vomited x 4-5 times and had diarrhea x 5-6 times in last 24 hours. Pt states he was last clean 2 years ago.

## 2014-08-30 NOTE — Progress Notes (Signed)
Writer faxed [facility] resources (and detox facilities) to [facility] for patient at discharge.   Verlon Setting, [place] Disposition staff 08/30/2014 6:48 PM

## 2014-10-20 ENCOUNTER — Encounter (HOSPITAL_COMMUNITY): Payer: Self-pay | Admitting: *Deleted

## 2014-10-20 ENCOUNTER — Inpatient Hospital Stay (HOSPITAL_COMMUNITY)
Admission: EM | Admit: 2014-10-20 | Discharge: 2014-10-27 | DRG: 638 | Disposition: A | Discharge status: Home / Self Care | Payer: Medicare Other | Attending: Internal Medicine | Admitting: Internal Medicine

## 2014-10-20 DIAGNOSIS — R609 Edema, unspecified: Secondary | ICD-10-CM | POA: Diagnosis not present

## 2014-10-20 DIAGNOSIS — E1140 Type 2 diabetes mellitus with diabetic neuropathy, unspecified: Secondary | ICD-10-CM | POA: Diagnosis present

## 2014-10-20 DIAGNOSIS — F17210 Nicotine dependence, cigarettes, uncomplicated: Secondary | ICD-10-CM | POA: Diagnosis present

## 2014-10-20 DIAGNOSIS — K219 Gastro-esophageal reflux disease without esophagitis: Secondary | ICD-10-CM | POA: Diagnosis present

## 2014-10-20 DIAGNOSIS — Z96652 Presence of left artificial knee joint: Secondary | ICD-10-CM | POA: Diagnosis present

## 2014-10-20 DIAGNOSIS — Z96641 Presence of right artificial hip joint: Secondary | ICD-10-CM | POA: Diagnosis present

## 2014-10-20 DIAGNOSIS — L03115 Cellulitis of right lower limb: Secondary | ICD-10-CM | POA: Diagnosis present

## 2014-10-20 DIAGNOSIS — M1990 Unspecified osteoarthritis, unspecified site: Secondary | ICD-10-CM | POA: Diagnosis present

## 2014-10-20 DIAGNOSIS — M7989 Other specified soft tissue disorders: Secondary | ICD-10-CM | POA: Diagnosis not present

## 2014-10-20 DIAGNOSIS — Z7984 Long term (current) use of oral hypoglycemic drugs: Secondary | ICD-10-CM

## 2014-10-20 DIAGNOSIS — Z8701 Personal history of pneumonia (recurrent): Secondary | ICD-10-CM

## 2014-10-20 DIAGNOSIS — E119 Type 2 diabetes mellitus without complications: Secondary | ICD-10-CM | POA: Diagnosis not present

## 2014-10-20 DIAGNOSIS — Z833 Family history of diabetes mellitus: Secondary | ICD-10-CM | POA: Diagnosis not present

## 2014-10-20 DIAGNOSIS — E11628 Type 2 diabetes mellitus with other skin complications: Secondary | ICD-10-CM | POA: Diagnosis not present

## 2014-10-20 DIAGNOSIS — Z8673 Personal history of transient ischemic attack (TIA), and cerebral infarction without residual deficits: Secondary | ICD-10-CM | POA: Diagnosis not present

## 2014-10-20 DIAGNOSIS — F17200 Nicotine dependence, unspecified, uncomplicated: Secondary | ICD-10-CM | POA: Diagnosis not present

## 2014-10-20 DIAGNOSIS — I10 Essential (primary) hypertension: Secondary | ICD-10-CM | POA: Diagnosis present

## 2014-10-20 DIAGNOSIS — L0390 Cellulitis, unspecified: Secondary | ICD-10-CM | POA: Diagnosis present

## 2014-10-20 DIAGNOSIS — E785 Hyperlipidemia, unspecified: Secondary | ICD-10-CM | POA: Diagnosis present

## 2014-10-20 LAB — CBC WITH DIFFERENTIAL/PLATELET
Basophils Absolute: 0.1 10*3/uL (ref 0.0–0.1)
Basophils Relative: 1 %
Eosinophils Absolute: 0.3 10*3/uL (ref 0.0–0.7)
Eosinophils Relative: 3 %
HEMATOCRIT: 38.9 % — AB (ref 39.0–52.0)
HEMOGLOBIN: 13 g/dL (ref 13.0–17.0)
LYMPHS ABS: 2 10*3/uL (ref 0.7–4.0)
Lymphocytes Relative: 22 %
MCH: 29.3 pg (ref 26.0–34.0)
MCHC: 33.4 g/dL (ref 30.0–36.0)
MCV: 87.6 fL (ref 78.0–100.0)
MONOS PCT: 6 %
Monocytes Absolute: 0.6 10*3/uL (ref 0.1–1.0)
NEUTROS ABS: 6.5 10*3/uL (ref 1.7–7.7)
NEUTROS PCT: 68 %
Platelets: 193 10*3/uL (ref 150–400)
RBC: 4.44 MIL/uL (ref 4.22–5.81)
RDW: 14.3 % (ref 11.5–15.5)
WBC: 9.5 10*3/uL (ref 4.0–10.5)

## 2014-10-20 LAB — BASIC METABOLIC PANEL
Anion gap: 8 (ref 5–15)
BUN: 14 mg/dL (ref 6–20)
CALCIUM: 8.6 mg/dL — AB (ref 8.9–10.3)
CO2: 28 mmol/L (ref 22–32)
Chloride: 101 mmol/L (ref 101–111)
Creatinine, Ser: 1.06 mg/dL (ref 0.61–1.24)
Glucose, Bld: 99 mg/dL (ref 65–99)
Potassium: 3.5 mmol/L (ref 3.5–5.1)
SODIUM: 137 mmol/L (ref 135–145)

## 2014-10-20 LAB — TSH: TSH: 2.338 u[IU]/mL (ref 0.350–4.500)

## 2014-10-20 MED ORDER — HEPARIN SODIUM (PORCINE) 5000 UNIT/ML IJ SOLN
5000.0000 [IU] | Freq: Three times a day (TID) | INTRAMUSCULAR | Status: DC
  Administered 2014-10-20 – 2014-10-27 (×20): 5000 [IU] via SUBCUTANEOUS
  Filled 2014-10-20 (×20): qty 1

## 2014-10-20 MED ORDER — MIRTAZAPINE 30 MG PO TABS
30.0000 mg | ORAL_TABLET | Freq: Every day | ORAL | Status: DC
  Administered 2014-10-20 – 2014-10-26 (×7): 30 mg via ORAL
  Filled 2014-10-20 (×8): qty 1

## 2014-10-20 MED ORDER — MORPHINE SULFATE (PF) 4 MG/ML IV SOLN
4.0000 mg | Freq: Once | INTRAVENOUS | Status: AC
  Administered 2014-10-20: 4 mg via INTRAVENOUS
  Filled 2014-10-20: qty 1

## 2014-10-20 MED ORDER — SODIUM CHLORIDE 0.9 % IV BOLUS (SEPSIS)
1000.0000 mL | Freq: Once | INTRAVENOUS | Status: AC
  Administered 2014-10-20: 1000 mL via INTRAVENOUS

## 2014-10-20 MED ORDER — MORPHINE SULFATE (PF) 4 MG/ML IV SOLN
4.0000 mg | Freq: Once | INTRAVENOUS | Status: AC
Start: 2014-10-20 — End: 2014-10-20
  Administered 2014-10-20: 4 mg via INTRAVENOUS
  Filled 2014-10-20: qty 1

## 2014-10-20 MED ORDER — VANCOMYCIN HCL 10 G IV SOLR
1500.0000 mg | Freq: Once | INTRAVENOUS | Status: AC
  Administered 2014-10-20: 1500 mg via INTRAVENOUS
  Filled 2014-10-20: qty 1500

## 2014-10-20 MED ORDER — QUETIAPINE FUMARATE 100 MG PO TABS
200.0000 mg | ORAL_TABLET | Freq: Every day | ORAL | Status: DC
  Administered 2014-10-20 – 2014-10-26 (×7): 200 mg via ORAL
  Filled 2014-10-20 (×8): qty 2

## 2014-10-20 MED ORDER — GABAPENTIN 400 MG PO CAPS
400.0000 mg | ORAL_CAPSULE | Freq: Four times a day (QID) | ORAL | Status: DC
  Administered 2014-10-20 – 2014-10-27 (×27): 400 mg via ORAL
  Filled 2014-10-20 (×22): qty 1
  Filled 2014-10-20: qty 4
  Filled 2014-10-20 (×6): qty 1

## 2014-10-20 MED ORDER — VANCOMYCIN HCL IN DEXTROSE 1-5 GM/200ML-% IV SOLN
1000.0000 mg | Freq: Three times a day (TID) | INTRAVENOUS | Status: DC
  Administered 2014-10-20 – 2014-10-21 (×4): 1000 mg via INTRAVENOUS
  Filled 2014-10-20 (×6): qty 200

## 2014-10-20 MED ORDER — INSULIN ASPART 100 UNIT/ML ~~LOC~~ SOLN
0.0000 [IU] | Freq: Three times a day (TID) | SUBCUTANEOUS | Status: DC
  Administered 2014-10-21: 2 [IU] via SUBCUTANEOUS
  Administered 2014-10-22: 3 [IU] via SUBCUTANEOUS
  Administered 2014-10-24 – 2014-10-26 (×3): 2 [IU] via SUBCUTANEOUS
  Administered 2014-10-27: 3 [IU] via SUBCUTANEOUS

## 2014-10-20 MED ORDER — ESCITALOPRAM OXALATE 10 MG PO TABS
20.0000 mg | ORAL_TABLET | Freq: Every morning | ORAL | Status: DC
  Administered 2014-10-21 – 2014-10-27 (×7): 20 mg via ORAL
  Filled 2014-10-20 (×7): qty 2

## 2014-10-20 MED ORDER — INSULIN ASPART 100 UNIT/ML ~~LOC~~ SOLN: 0.0000 [IU] | Freq: Every day | SUBCUTANEOUS | Status: DC

## 2014-10-20 MED ORDER — OXYCODONE-ACETAMINOPHEN 5-325 MG PO TABS
1.0000 | ORAL_TABLET | ORAL | Status: DC | PRN
  Administered 2014-10-20 – 2014-10-23 (×12): 1 via ORAL
  Filled 2014-10-20 (×13): qty 1

## 2014-10-20 MED ORDER — ONDANSETRON HCL 4 MG/2ML IJ SOLN
4.0000 mg | Freq: Once | INTRAMUSCULAR | Status: AC
  Administered 2014-10-20: 4 mg via INTRAVENOUS
  Filled 2014-10-20: qty 2

## 2014-10-20 MED ORDER — LORAZEPAM 1 MG PO TABS
1.0000 mg | ORAL_TABLET | Freq: Three times a day (TID) | ORAL | Status: DC | PRN
  Administered 2014-10-20 – 2014-10-27 (×15): 1 mg via ORAL
  Filled 2014-10-20 (×16): qty 1

## 2014-10-20 MED ORDER — METFORMIN HCL 500 MG PO TABS
500.0000 mg | ORAL_TABLET | Freq: Two times a day (BID) | ORAL | Status: DC
  Administered 2014-10-21 – 2014-10-24 (×7): 500 mg via ORAL
  Filled 2014-10-20 (×7): qty 1

## 2014-10-20 MED ORDER — VANCOMYCIN HCL IN DEXTROSE 1-5 GM/200ML-% IV SOLN
INTRAVENOUS | Status: AC
  Filled 2014-10-20: qty 400

## 2014-10-20 NOTE — ED Notes (Signed)
Patient repeatedly walking around emergency department patient informed to stay in his room numerous times by numerous staff members. At one point we were unable to locate patient in the department, security seen patient "3 or 4 blocks" away from hospital. Patient returned to hospital and Dr. Lacinda Axon spoke with him about not leaving premises if he is going to stay for treatment. Patient transferred to dept 300 at this time

## 2014-10-20 NOTE — Progress Notes (Signed)
Pt educated about not walking on foot that his been giving him so many problems. Pt walking with a cane and stated his leg was in pain, but he was leaving to go downstairs to a vending machine. Pt educated on effects of pain medication, and the need to remain of foot. Pt educated and escorted back to his room. Pt states he is not going to comply with requests to remain off of foot. Will continue to monitor pt.

## 2014-10-20 NOTE — ED Notes (Signed)
Pt states swelling started 2 days ago.

## 2014-10-20 NOTE — H&P (Signed)
[facility] History and Physical  [name] IRW:[id number] DOB: Jun 26, 1965    PCP:   No PCP Per Patient   Chief Complaint: swelling of the right lower extremities.  HPI: [name] is an 49 y.o. male with hx of DM, non compliant, HTN, HLD, hx of polysubstance abuse, presented to the ER with increase redness, swelling, and pain of the right lower extremity.  He denied any systemic symptoms, with no nausea, vomiting, fever, or chills.   Evaluation in the ER included a WBC with WBC of 9.5K, Hb of 13 g per dL, and platelet count of 193K.  His Cr was 1.06.  I was called to admit him, and after evaluated him, he said he wasn't told about being admitted, and that he wanted to leave AMA.  I went and told Dr Lacinda Axon about this. Apparently, the patient left the ER and subsequently got sent to the floor without me knowing he was admitted.  I was called afterward for pain medication without realizing that he was admitted.   Rewiew of Systems:  Constitutional: Negativi, redness and discharge, diplopia, visual changes, or flashes of lighthoarseness, nasal congestion, sinus pressure and sore throat. No headaches; tinnitus, drooling, or problem swallowing. Cardiovascular: Negative for chest pain, palpitations, diaphoresis, dyspnea and peripheral edema. ; No orthopnea, PND Respiratory: Negative for cough, hemoptysis, wheezing and stridor. No pleuritic chestpain. Gastrointestinal: Negative for nausea, vomiting, diarrhea, constipation, abdominal pain, melena, blood in stool, hematemesis, jaundice and rectal bleeding.    Genitourinary: Negative for frequency, dysuria, incontinence,flank pain and hematuria; Musculoskeletal: Negative for back pain and neck pain. Skin: . Negative for pruritus, rash, abrasions, bruising and skin lesion.; ulcerations Neuro: Negative for headache, lightheadedness and neck stiffness. Negative for weakness, altered level of consciousness , altered mental status, extremity weakness,  burning feet, involuntary movement, seizure and syncope.  Psych: negative for anxiety, depression, insomnia, tearfulness, panic attacks, hallucinations, paranoia, suicidal or homicidal ideation    Past Medical History  Diagnosis Date  . Diabetic foot ulcer ([place])   . ETOH abuse   . Anxiety   . Open wound     bottom of foot  . Diabetes mellitus without complication (HCC)     borderline  . Neuromuscular disorder (HCC)     neuropathy  . GERD (gastroesophageal reflux disease)     tums  . Pneumonia ~ 2012  . History of blood transfusion     "related to left knee OR; probably right hip too" (04/21/2012)  . Stroke [facility]) 2008    "they said I might have had one during right hip replacement" (04/21/2012)  . Arthritis     "everywhere" (04/21/2012)  . Mental disorder   . Depression   . DDD (degenerative disc disease)   . Cellulitis and abscess of foot 12/19/2013    LEFT FOOT    Past Surgical History  Procedure Laterality Date  . Total hip arthroplasty Right 2008  . Total knee arthroplasty Left 2006  . Joint replacement    . Lung lobectomy Left ~ 2006  . Revision total hip arthroplasty Right 2008    "4-5 months after replacement" (04/21/2012)  . Knee arthroscopy Bilateral 1980's/1990's  . Lung lobectomy    . Metatarsal osteotomy  10/29/2011    Procedure: METATARSAL OSTEOTOMY;  Surgeon: [name], MD;  Location: [place];  Service: Orthopedics;  Laterality: Left;  Left 1st Metatarsal Dorsal Closing Wedge   . Total knee arthroplasty Right 04/20/2012  . Total knee arthroplasty Right 04/20/2012  Procedure: TOTAL KNEE ARTHROPLASTY;  Surgeon: [name], MD;  Location: [place];  Service: Orthopedics;  Laterality: Right;  Right Total Knee Arthroplasty    Medications:  HOME MEDS: Prior to Admission medications   Medication Sig Start Date End Date Taking? Authorizing Provider  clonazePAM (KLONOPIN) 1 MG tablet Take 1 mg by mouth 4 (four) times daily.   Yes Historical Provider, MD  escitalopram  (LEXAPRO) 20 MG tablet Take 1 tablet (20 mg total) by mouth every morning. For depression 03/29/13  Yes Encarnacion Slates, NP  gabapentin (NEURONTIN) 400 MG capsule Take 1 capsule (400 mg total) by mouth 4 (four) times daily. For substance withdrawal syndrome 03/29/13  Yes Encarnacion Slates, NP  metFORMIN (GLUCOPHAGE) 500 MG tablet Take 1 tablet (500 mg total) by mouth 2 (two) times daily with a meal. 01/15/14  Yes Philemon Kingdom, MD  mirtazapine (REMERON) 30 MG tablet Take 30 mg by mouth at bedtime.   Yes Historical Provider, MD  QUEtiapine (SEROQUEL) 200 MG tablet Take 1 tablet (200 mg total) by mouth at bedtime. For mood control 03/29/13  Yes Encarnacion Slates, NP  HYDROcodone-acetaminophen (NORCO/VICODIN) 5-325 MG per tablet Take 1 tablet by mouth every 4 (four) hours as needed. Patient not taking: Reported on 10/20/2014 07/03/14   Evalee Jefferson, PA-C  LORazepam (ATIVAN) 1 MG tablet Take 1 tablet (1 mg total) by mouth 3 (three) times daily as needed for anxiety. Patient not taking: Reported on 10/20/2014 08/30/14   Nat Christen, MD  methocarbamol (ROBAXIN) 500 MG tablet Take 2 tablets (1,000 mg total) by mouth every 8 (eight) hours as needed for muscle spasms. Patient not taking: Reported on 10/20/2014 05/02/14   [name], MD  oxyCODONE-acetaminophen (PERCOCET/ROXICET) 5-325 MG per tablet Take 1 tablet by mouth every 4 (four) hours as needed. Patient not taking: Reported on 10/20/2014 06/03/14   Evalee Jefferson, PA-C  promethazine (PHENERGAN) 25 MG tablet Take 1 tablet (25 mg total) by mouth every 6 (six) hours as needed. Patient not taking: Reported on 10/20/2014 08/30/14   Nat Christen, MD     Allergies:  Allergies  Allergen Reactions  . Benadryl [Diphenhydramine Hcl] Other (See Comments)    Pt states leg spasm  . Ibuprofen Other (See Comments)    "upset stomach"  . Trazodone And Nefazodone Other (See Comments)    Leg Spasms.     Social History:   reports that he has been smoking Cigarettes.  He has a 30  pack-year smoking history. He has never used smokeless tobacco. He reports that he uses illicit drugs (Cocaine). He reports that he does not drink alcohol.  Family History: Family History  Problem Relation Age of Onset  . Diabetes Father      Physical Exam: Filed Vitals:   10/20/14 1058 10/20/14 1522 10/20/14 1703  BP: 129/84 126/83 122/72  Pulse: 103 81 80  Temp: 97.7 F (36.5 C) 97.8 F (36.6 C) 97.8 F (36.6 C)  TempSrc: Oral Oral Oral  Resp: 16 16 16   Height: 6\' 3"  (1.905 m)  6\' 3"  (1.905 m)  Weight: 90.719 kg (200 lb)  98.521 kg (217 lb 3.2 oz)  SpO2: 99% 100% 100%   Blood pressure 122/72, pulse 80, temperature 97.8 F (36.6 C), temperature source Oral, resp. rate 16, height 6\' 3"  (1.905 m), weight 98.521 kg (217 lb 3.2 oz), SpO2 100 %.  GEN:  Pleasant  patient lying in the stretcher in no acute distress; cooperative with exam. PSYCH:  alert and  oriented x4; does not appear anxious or depressed; affect is appropriate. HEENT: Mucous membranes pink and anicteric; PERRLA; EOM intact; no cervical lymphadenopathy nor thyromegaly or carotid bruit; no JVD; There were no stridor. Neck is very supple. Breasts:: Not examined CHEST WALL: No tenderness CHEST: Normal respiration, clear to auscultation bilaterally.  HEART: Regular rate and rhythm.  There are no murmur, rub, or gallops.   BACK: No kyphosis or scoliosis; no CVA tenderness ABDOMEN: soft and non-tender; no masses, no organomegaly, normal abdominal bowel sounds; no pannus; no intertriginous candida. There is no rebound and no distention. Rectal Exam: Not done EXTREMITIES: No bone or joint deformity; age-appropriate arthropathy of the hands and knees. no ulcerations.  There is no calf tenderness. Genitalia: not examined PULSES: 2+ and symmetric SKIN: Normal hydration no rash or ulceration CNS: Cranial nerves 2-12 grossly intact no focal lateralizing neurologic deficit.  Speech is fluent; uvula elevated with phonation,  facial symmetry and tongue midline. DTR are normal bilaterally, cerebella exam is intact, barbinski is negative and strengths are equaled bilaterally.  No sensory loss.   Labs on Admission:  Basic Metabolic Panel:  Recent Labs Lab 10/20/14 1250  NA 137  K 3.5  CL 101  CO2 28  GLUCOSE 99  BUN 14  CREATININE 1.06  CALCIUM 8.6*   CBC:  Recent Labs Lab 10/20/14 1250  WBC 9.5  NEUTROABS 6.5  HGB 13.0  HCT 38.9*  MCV 87.6  PLT 193   Assessment/Plan  Present on Admission:  . Cellulitis of right lower extremity . Cellulitis DM  HTN Hx of polysubstance abuse.  PLAN:  Will continue with IV Vancomycin, and continue with his home meds.  Will check his CBG with meals and hs.  I have continued his Vancomycin, and will do SSI moderate scale.  He is stable, full code, and will be admitted to [facility] service.  Thank you and Good Day.   Other plans as per order.   Orvan Falconer, MD. [facility] Pager [phone] 7pm to 7am.  10/20/2014, 7:14 PM

## 2014-10-20 NOTE — ED Notes (Signed)
Pt comes in for bilateral leg swelling with worsening of the right foot. Pt's right food is red with pitting edema. NAD noted.

## 2014-10-20 NOTE — ED Notes (Signed)
Report given to [facility] on Dept 300, all questions answered.

## 2014-10-20 NOTE — ED Provider Notes (Signed)
CSN: [id number]     Arrival date & time 10/20/14  1053 History  By signing my name below, I, [name], attest that this documentation has been prepared under the direction and in the presence of Javier Christen, MD. Electronically Signed: Meriel Palmer, ED Scribe. 10/20/2014. 12:47 PM.   Chief Complaint  Patient presents with  . Leg Swelling   The history is provided by the patient. No language interpreter was used.   HPI Comments: [name] is a 49 y.o. male, with a PMhx of cellulitis of RLE and DM, who presents to the Emergency Department complaining of worsening edema and erythema in BLE that is worse in the right lower extremity X 3 days. Pt notes a history of similar symptoms over a year ago when he was admitted to the hospital for cellulitis of LLE leading to IV antibiotics for several weeks. Pt describes his current symptoms to be more mild than past symptoms experienced with cellulitis. He has a PShx to left foot. Pt follow by PCP in [place].   Past Medical History  Diagnosis Date  . Diabetic foot ulcer ([place])   . ETOH abuse   . Anxiety   . Open wound     bottom of foot  . Diabetes mellitus without complication (HCC)     borderline  . Neuromuscular disorder (HCC)     neuropathy  . GERD (gastroesophageal reflux disease)     tums  . Pneumonia ~ 2012  . History of blood transfusion     "related to left knee OR; probably right hip too" (04/21/2012)  . Stroke [facility]) 2008    "they said I might have had one during right hip replacement" (04/21/2012)  . Arthritis     "everywhere" (04/21/2012)  . Mental disorder   . Depression   . DDD (degenerative disc disease)   . Cellulitis and abscess of foot 12/19/2013    LEFT FOOT   Past Surgical History  Procedure Laterality Date  . Total hip arthroplasty Right 2008  . Total knee arthroplasty Left 2006  . Joint replacement    . Lung lobectomy Left ~ 2006  . Revision total hip arthroplasty Right 2008    "4-5 months after replacement"  (04/21/2012)  . Knee arthroscopy Bilateral 1980's/1990's  . Lung lobectomy    . Metatarsal osteotomy  10/29/2011    Procedure: METATARSAL OSTEOTOMY;  Surgeon: [name], MD;  Location: [place];  Service: Orthopedics;  Laterality: Left;  Left 1st Metatarsal Dorsal Closing Wedge   . Total knee arthroplasty Right 04/20/2012  . Total knee arthroplasty Right 04/20/2012    Procedure: TOTAL KNEE ARTHROPLASTY;  Surgeon: [name], MD;  Location: [place];  Service: Orthopedics;  Laterality: Right;  Right Total Knee Arthroplasty   Family History  Problem Relation Age of Onset  . Diabetes Father    Social History  Substance Use Topics  . Smoking status: Current Every Day Smoker -- 1.00 packs/day for 30 years    Types: Cigarettes  . Smokeless tobacco: Never Used     Comment: 04/21/2012 offered smoking cessation materials; pt declines  12/19/2013 DECLINES SMOKING CESSATION MATERIALS  . Alcohol Use: No     Comment: Quit 09/20/14    Review of Systems  Constitutional: Negative for fever.  Cardiovascular: Positive for leg swelling ( BLE; worse in RLE).  Skin: Positive for color change ( RLE erythema ).  All other systems reviewed and are negative.  Allergies  Benadryl; Ibuprofen; and Trazodone and nefazodone  Home Medications   Prior to Admission medications   Medication Sig Start Date End Date Taking? Authorizing Provider  clonazePAM (KLONOPIN) 1 MG tablet Take 1 mg by mouth 4 (four) times daily.   Yes Historical Provider, MD  escitalopram (LEXAPRO) 20 MG tablet Take 1 tablet (20 mg total) by mouth every morning. For depression 03/29/13  Yes Encarnacion Slates, NP  gabapentin (NEURONTIN) 400 MG capsule Take 1 capsule (400 mg total) by mouth 4 (four) times daily. For substance withdrawal syndrome 03/29/13  Yes Encarnacion Slates, NP  metFORMIN (GLUCOPHAGE) 500 MG tablet Take 1 tablet (500 mg total) by mouth 2 (two) times daily with a meal. 01/15/14  Yes Philemon Kingdom, MD  mirtazapine (REMERON) 30 MG tablet  Take 30 mg by mouth at bedtime.   Yes Historical Provider, MD  QUEtiapine (SEROQUEL) 200 MG tablet Take 1 tablet (200 mg total) by mouth at bedtime. For mood control 03/29/13  Yes Encarnacion Slates, NP  HYDROcodone-acetaminophen (NORCO/VICODIN) 5-325 MG per tablet Take 1 tablet by mouth every 4 (four) hours as needed. Patient not taking: Reported on 10/20/2014 07/03/14   Evalee Jefferson, PA-C  LORazepam (ATIVAN) 1 MG tablet Take 1 tablet (1 mg total) by mouth 3 (three) times daily as needed for anxiety. Patient not taking: Reported on 10/20/2014 08/30/14   Javier Christen, MD  methocarbamol (ROBAXIN) 500 MG tablet Take 2 tablets (1,000 mg total) by mouth every 8 (eight) hours as needed for muscle spasms. Patient not taking: Reported on 10/20/2014 05/02/14   [name], MD  oxyCODONE-acetaminophen (PERCOCET/ROXICET) 5-325 MG per tablet Take 1 tablet by mouth every 4 (four) hours as needed. Patient not taking: Reported on 10/20/2014 06/03/14   Evalee Jefferson, PA-C  promethazine (PHENERGAN) 25 MG tablet Take 1 tablet (25 mg total) by mouth every 6 (six) hours as needed. Patient not taking: Reported on 10/20/2014 08/30/14   Javier Christen, MD   BP 129/84 mmHg  Pulse 103  Temp(Src) 97.7 F (36.5 C) (Oral)  Resp 16  Ht 6\' 3"  (1.905 m)  Wt 200 lb (90.719 kg)  BMI 25.00 kg/m2  SpO2 99% Physical Exam  Constitutional: He is oriented to person, place, and time. He appears well-developed and well-nourished.  HENT:  Head: Normocephalic and atraumatic.  Eyes: Conjunctivae and EOM are normal. Pupils are equal, round, and reactive to light.  Neck: Normal range of motion. Neck supple.  Cardiovascular: Normal rate and regular rhythm.   Pulmonary/Chest: Effort normal and breath sounds normal.  Abdominal: Soft. Bowel sounds are normal.  Musculoskeletal: Normal range of motion. He exhibits edema and tenderness.  RLE; erythema and edema from mid tibia distally with generalized tenderness; pustule about 2.5 cm in diameter over 1st MTP  joint  Neurological: He is alert and oriented to person, place, and time.  Skin: Skin is warm and dry.  Psychiatric: He has a normal mood and affect. His behavior is normal.  Nursing note and vitals reviewed.   ED Course  Procedures  DIAGNOSTIC STUDIES: Oxygen Saturation is 99% on RA, normal by my interpretation.    COORDINATION OF CARE: 12:46 PM Discussed treatment plan with pt at bedside and pt agreed to plan. Will order IV antibiotics.   Labs Review Labs Reviewed  BASIC METABOLIC PANEL - Abnormal; Notable for the following:    Calcium 8.6 (*)    All other components within normal limits  CBC WITH DIFFERENTIAL/PLATELET - Abnormal; Notable for the following:    HCT 38.9 (*)  All other components within normal limits      MDM   Final diagnoses:  Cellulitis of right lower extremity  Diabetes mellitus type 2 in nonobese [facility])    RLE cellulitis with associated diabetes. No evidence of sepsis. Rx IV vancomycin. Admit to general medicine.  I personally performed the services described in this documentation, which was scribed in my presence. The recorded information has been reviewed and is accurate.    Javier Christen, MD 10/20/14 [phone]

## 2014-10-20 NOTE — Progress Notes (Signed)
ANTIBIOTIC CONSULT NOTE - INITIAL  Pharmacy Consult for Vancomycin Indication: cellulitis  Allergies  Allergen Reactions  . Benadryl [Diphenhydramine Hcl] Other (See Comments)    Pt states leg spasm  . Ibuprofen Other (See Comments)    "upset stomach"  . Trazodone And Nefazodone Other (See Comments)    Leg Spasms.     Patient Measurements: Height: 6\' 3"  (190.5 cm) Weight: 200 lb (90.719 kg) IBW/kg (Calculated) : 84.5 Adjusted Body Weight:   Vital Signs: Temp: 97.7 F (36.5 C) (10/01 1058) Temp Source: Oral (10/01 1058) BP: 129/84 mmHg (10/01 1058) Pulse Rate: 103 (10/01 1058) Intake/Output from previous day:   Intake/Output from this shift:    Labs:  Recent Labs  10/20/14 1250  WBC 9.5  HGB 13.0  PLT 193  CREATININE 1.06   Estimated Creatinine Clearance: 98.5 mL/min (by C-G formula based on Cr of 1.06). No results for input(s): VANCOTROUGH, VANCOPEAK, VANCORANDOM, GENTTROUGH, GENTPEAK, GENTRANDOM, TOBRATROUGH, TOBRAPEAK, TOBRARND, AMIKACINPEAK, AMIKACINTROU, AMIKACIN in the last 72 hours.   Microbiology: No results found for this or any previous visit (from the past 720 hour(s)).  Medical History: Past Medical History  Diagnosis Date  . Diabetic foot ulcer ([place])   . ETOH abuse   . Anxiety   . Open wound     bottom of foot  . Diabetes mellitus without complication (HCC)     borderline  . Neuromuscular disorder (HCC)     neuropathy  . GERD (gastroesophageal reflux disease)     tums  . Pneumonia ~ 2012  . History of blood transfusion     "related to left knee OR; probably right hip too" (04/21/2012)  . Stroke [facility]) 2008    "they said I might have had one during right hip replacement" (04/21/2012)  . Arthritis     "everywhere" (04/21/2012)  . Mental disorder   . Depression   . DDD (degenerative disc disease)   . Cellulitis and abscess of foot 12/19/2013    LEFT FOOT    Assessment: 49 yo male ED patient, bilateral leg swelling with worsening of the  right foot. Right foot red with pitting edema Excellent renal function  Goal of Therapy:  Vancomycin trough level 10-15 mcg/ml  Plan:  Vancomycin 1500 mg IV, then Vancomycin 1 GM IV every 8 hours Vancomycin trough at steady state Monitor renal function Labs per protocol  Abner Greenspan, Anaika Santillano Bennett 10/20/2014,1:33 PM

## 2014-10-21 LAB — CBC WITH DIFFERENTIAL/PLATELET
BASOS ABS: 0.1 10*3/uL (ref 0.0–0.1)
Basophils Relative: 1 %
Eosinophils Absolute: 0.3 10*3/uL (ref 0.0–0.7)
Eosinophils Relative: 5 %
HEMATOCRIT: 38.9 % — AB (ref 39.0–52.0)
Hemoglobin: 12.6 g/dL — ABNORMAL LOW (ref 13.0–17.0)
LYMPHS PCT: 31 %
Lymphs Abs: 1.5 10*3/uL (ref 0.7–4.0)
MCH: 28.6 pg (ref 26.0–34.0)
MCHC: 32.4 g/dL (ref 30.0–36.0)
MCV: 88.2 fL (ref 78.0–100.0)
Monocytes Absolute: 0.3 10*3/uL (ref 0.1–1.0)
Monocytes Relative: 6 %
NEUTROS ABS: 2.8 10*3/uL (ref 1.7–7.7)
NEUTROS PCT: 57 %
Platelets: 203 10*3/uL (ref 150–400)
RBC: 4.41 MIL/uL (ref 4.22–5.81)
RDW: 14.5 % (ref 11.5–15.5)
WBC: 4.8 10*3/uL (ref 4.0–10.5)

## 2014-10-21 LAB — BASIC METABOLIC PANEL
ANION GAP: 6 (ref 5–15)
BUN: 11 mg/dL (ref 6–20)
CALCIUM: 8.6 mg/dL — AB (ref 8.9–10.3)
CO2: 27 mmol/L (ref 22–32)
Chloride: 106 mmol/L (ref 101–111)
Creatinine, Ser: 0.99 mg/dL (ref 0.61–1.24)
Glucose, Bld: 154 mg/dL — ABNORMAL HIGH (ref 65–99)
Potassium: 3.7 mmol/L (ref 3.5–5.1)
Sodium: 139 mmol/L (ref 135–145)

## 2014-10-21 LAB — GLUCOSE, CAPILLARY
GLUCOSE-CAPILLARY: 90 mg/dL (ref 65–99)
GLUCOSE-CAPILLARY: 124 mg/dL — AB (ref 65–99)
Glucose-Capillary: 111 mg/dL — ABNORMAL HIGH (ref 65–99)
Glucose-Capillary: 148 mg/dL — ABNORMAL HIGH (ref 65–99)

## 2014-10-21 MED ORDER — NICOTINE 14 MG/24HR TD PT24
14.0000 mg | MEDICATED_PATCH | Freq: Every day | TRANSDERMAL | Status: DC
  Administered 2014-10-21 – 2014-10-26 (×6): 14 mg via TRANSDERMAL
  Filled 2014-10-21 (×7): qty 1

## 2014-10-21 NOTE — Plan of Care (Signed)
Problem: Phase I Progression Outcomes Goal: Pain controlled with appropriate interventions Outcome: Progressing Pt asleep and resting comfortably. Pain medicine given at 20:22 for pain reported as a 9. No more pain medicine has been requested as of 10/21/2014 01:15. Goal: OOB as tolerated unless otherwise ordered Outcome: Progressing Pt ambulated in hallway with cane on his own. Pt educated on calling staff upon ambulation due to admission diagnosis and for safety reasons.  Goal: Hemodynamically stable Outcome: Progressing See flowsheet and results. Pt's blood sugar was reported to be 159.

## 2014-10-21 NOTE — Progress Notes (Signed)
[facility] PROGRESS NOTE  [name] AJO:[id number] DOB: 01-16-1966    PCP:   No PCP Per Patient   HPI: [name] is an 49 y.o. male admitted yesterday for diabetic cellulitis.  He was started on IV van.  His cellulitis is slightly better today.  The swelling has improved.  He requested Nicotine Patch.   Rewiew of Systems:  Constitutional: Negative for malaise, fever and chills. No significant weight loss or weight gain Eyes: Negative for eye pain, redness and discharge, diplopia, visual changes, or flashes of light. ENMT: Negative for ear pain, hoarseness, nasal congestion, sinus pressure and sore throat. No headaches; tinnitus, drooling, or problem swallowing. Cardiovascular: Negative for chest pain, palpitations, diaphoresis, dyspnea and peripheral edema. ; No orthopnea, PND Respiratory: Negative for cough, hemoptysis, wheezing and stridor. No pleuritic chestpain. Gastrointestinal: Negative for nausea, vomiting, diarrhea, constipation, abdominal pain, melena, blood in stool, hematemesis, jaundice and rectal bleeding.    Genitourinary: Negative for frequency, dysuria, incontinence,flank pain and hematuria; Musculoskeletal: Negative for back pain and neck pain. Negative for swelling and trauma.;  Skin: . Negative for pruritus, rash, abrasions, bruising and skin lesion.; ulcerations Neuro: Negative for headache, lightheadedness and neck stiffness. Negative for weakness, altered level of consciousness , altered mental status, extremity weakness, burning feet, involuntary movement, seizure and syncope.  Psych: negative for anxiety, depression, insomnia, tearfulness, panic attacks, hallucinations, paranoia, suicidal or homicidal ideation    Past Medical History  Diagnosis Date  . Diabetic foot ulcer ([name])   . ETOH abuse   . Anxiety   . Open wound     bottom of foot  . Diabetes mellitus without complication (HCC)     borderline  . Neuromuscular disorder (HCC)     neuropathy   . GERD (gastroesophageal reflux disease)     tums  . Pneumonia ~ 2012  . History of blood transfusion     "related to left knee OR; probably right hip too" (04/21/2012)  . Stroke [facility]) 2008    "they said I might have had one during right hip replacement" (04/21/2012)  . Arthritis     "everywhere" (04/21/2012)  . Mental disorder   . Depression   . DDD (degenerative disc disease)   . Cellulitis and abscess of foot 12/19/2013    LEFT FOOT    Past Surgical History  Procedure Laterality Date  . Total hip arthroplasty Right 2008  . Total knee arthroplasty Left 2006  . Joint replacement    . Lung lobectomy Left ~ 2006  . Revision total hip arthroplasty Right 2008    "4-5 months after replacement" (04/21/2012)  . Knee arthroscopy Bilateral 1980's/1990's  . Lung lobectomy    . Metatarsal osteotomy  10/29/2011    Procedure: METATARSAL OSTEOTOMY;  Surgeon: [name], MD;  Location: [place];  Service: Orthopedics;  Laterality: Left;  Left 1st Metatarsal Dorsal Closing Wedge   . Total knee arthroplasty Right 04/20/2012  . Total knee arthroplasty Right 04/20/2012    Procedure: TOTAL KNEE ARTHROPLASTY;  Surgeon: [name], MD;  Location: [place];  Service: Orthopedics;  Laterality: Right;  Right Total Knee Arthroplasty    Medications:  HOME MEDS: Prior to Admission medications   Medication Sig Start Date End Date Taking? Authorizing Provider  clonazePAM (KLONOPIN) 1 MG tablet Take 1 mg by mouth 4 (four) times daily.   Yes Historical Provider, MD  escitalopram (LEXAPRO) 20 MG tablet Take 1 tablet (20 mg total) by mouth every morning. For depression 03/29/13  Yes  Encarnacion Slates, NP  gabapentin (NEURONTIN) 400 MG capsule Take 1 capsule (400 mg total) by mouth 4 (four) times daily. For substance withdrawal syndrome 03/29/13  Yes Encarnacion Slates, NP  metFORMIN (GLUCOPHAGE) 500 MG tablet Take 1 tablet (500 mg total) by mouth 2 (two) times daily with a meal. 01/15/14  Yes Philemon Kingdom, MD  mirtazapine  (REMERON) 30 MG tablet Take 30 mg by mouth at bedtime.   Yes Historical Provider, MD  QUEtiapine (SEROQUEL) 200 MG tablet Take 1 tablet (200 mg total) by mouth at bedtime. For mood control 03/29/13  Yes Encarnacion Slates, NP  HYDROcodone-acetaminophen (NORCO/VICODIN) 5-325 MG per tablet Take 1 tablet by mouth every 4 (four) hours as needed. Patient not taking: Reported on 10/20/2014 07/03/14   Evalee Jefferson, PA-C  LORazepam (ATIVAN) 1 MG tablet Take 1 tablet (1 mg total) by mouth 3 (three) times daily as needed for anxiety. Patient not taking: Reported on 10/20/2014 08/30/14   Nat Christen, MD  methocarbamol (ROBAXIN) 500 MG tablet Take 2 tablets (1,000 mg total) by mouth every 8 (eight) hours as needed for muscle spasms. Patient not taking: Reported on 10/20/2014 05/02/14   [name], MD  oxyCODONE-acetaminophen (PERCOCET/ROXICET) 5-325 MG per tablet Take 1 tablet by mouth every 4 (four) hours as needed. Patient not taking: Reported on 10/20/2014 06/03/14   Evalee Jefferson, PA-C  promethazine (PHENERGAN) 25 MG tablet Take 1 tablet (25 mg total) by mouth every 6 (six) hours as needed. Patient not taking: Reported on 10/20/2014 08/30/14   Nat Christen, MD     Allergies:  Allergies  Allergen Reactions  . Benadryl [Diphenhydramine Hcl] Other (See Comments)    Pt states leg spasm  . Ibuprofen Other (See Comments)    "upset stomach"  . Trazodone And Nefazodone Other (See Comments)    Leg Spasms.     Social History:   reports that he has been smoking Cigarettes.  He has a 30 pack-year smoking history. He has never used smokeless tobacco. He reports that he uses illicit drugs (Cocaine). He reports that he does not drink alcohol.  Family History: Family History  Problem Relation Age of Onset  . Diabetes Father      Physical Exam: Filed Vitals:   10/20/14 1522 10/20/14 1703 10/20/14 2242 10/21/14 0504  BP: 126/83 122/72 119/75 129/76  Pulse: 81 80 77 94  Temp: 97.8 F (36.6 C) 97.8 F (36.6 C) 98 F  (36.7 C) 97.9 F (36.6 C)  TempSrc: Oral Oral Oral Oral  Resp: 16 16 16 16   Height:  6\' 3"  (1.905 m)    Weight:  98.521 kg (217 lb 3.2 oz)    SpO2: 100% 100% 99% 96%   Blood pressure 129/76, pulse 94, temperature 97.9 F (36.6 C), temperature source Oral, resp. rate 16, height 6\' 3"  (1.905 m), weight 98.521 kg (217 lb 3.2 oz), SpO2 96 %.  GEN:  Pleasant patient lying in the stretcher in no acute distress; cooperative with exam. PSYCH:  alert and oriented x4; does not appear anxious or depressed; affect is appropriate. HEENT: Mucous membranes pink and anicteric; PERRLA; EOM intact; no cervical lymphadenopathy nor thyromegaly or carotid bruit; no JVD; There were no stridor. Neck is very supple. Breasts:: Not examined CHEST WALL: No tenderness CHEST: Normal respiration, clear to auscultation bilaterally.  HEART: Regular rate and rhythm.  There are no murmur, rub, or gallops.   BACK: No kyphosis or scoliosis; no CVA tenderness ABDOMEN: soft and non-tender; no masses, no  organomegaly, normal abdominal bowel sounds; no pannus; no intertriginous candida. There is no rebound and no distention. Rectal Exam: Not done EXTREMITIES: No bone or joint deformity; age-appropriate arthropathy of the hands and knees; no edema; no ulcerations.  There is no calf tenderness. Genitalia: not examined PULSES: 2+ and symmetric SKIN: Erythema on the right lower ext.  Blister on the bottom of his foot.   Left cellulitis is minimal.  CNS: Cranial nerves 2-12 grossly intact no focal lateralizing neurologic deficit.  Speech is fluent; uvula elevated with phonation, facial symmetry and tongue midline. DTR are normal bilaterally, cerebella exam is intact, barbinski is negative and strengths are equaled bilaterally.  No sensory loss.   Labs on Admission:  Basic Metabolic Panel:  Recent Labs Lab 10/20/14 1250 10/21/14 0619  NA 137 139  K 3.5 3.7  CL 101 106  CO2 28 27  GLUCOSE 99 154*  BUN 14 11  CREATININE  1.06 0.99  CALCIUM 8.6* 8.6*   CBC:  Recent Labs Lab 10/20/14 1250 10/21/14 0619  WBC 9.5 4.8  NEUTROABS 6.5 2.8  HGB 13.0 12.6*  HCT 38.9* 38.9*  MCV 87.6 88.2  PLT 193 203   Cardiac Enzymes: No results for input(s): CKTOTAL, CKMB, CKMBINDEX, TROPONINI in the last 168 hours.  CBG:  Recent Labs Lab 10/21/14 0755  GLUCAP 111*    Assessment/Plan Present on Admission:  . Cellulitis of right lower extremity . Cellulitis  PLAN:  Will continue with IV Vancomycin.  Start Nicoderm CQ.  Continue with oral narcotics low dose.  For his DM, will continue with Metformin and SSI.  CBG controlled.   Other plans as per orders.  Code Status: FULL Haskel Khan, MD. [facility] Pager [phone] 7pm to 7am.  10/21/2014, 11:51 AM

## 2014-10-21 NOTE — Progress Notes (Signed)
Pt upset about medication. Pt states his medications (Remeron and Seroquel) do not look like the medications he took last night. Nurse pulled Remeron and Seroquel again to verify the colors and strengths of the medications.Charge nurse and nurse verified medications with pt. Pt still upset. Pt states he will take the medications later. Charge nurse returned medications back to pyxis.

## 2014-10-21 NOTE — Progress Notes (Signed)
Utilization review Completed Ramey Ketcherside RN BSN   

## 2014-10-22 LAB — BASIC METABOLIC PANEL
Anion gap: 6 (ref 5–15)
BUN: 13 mg/dL (ref 6–20)
CHLORIDE: 106 mmol/L (ref 101–111)
CO2: 29 mmol/L (ref 22–32)
CREATININE: 1.05 mg/dL (ref 0.61–1.24)
Calcium: 9.3 mg/dL (ref 8.9–10.3)
GFR calc Af Amer: >60 mL/min (ref >60)
GFR calc non Af Amer: >60 mL/min (ref >60)
GLUCOSE: 104 mg/dL — AB (ref 65–99)
POTASSIUM: 4 mmol/L (ref 3.5–5.1)
Sodium: 141 mmol/L (ref 135–145)

## 2014-10-22 LAB — GLUCOSE, CAPILLARY
GLUCOSE-CAPILLARY: 145 mg/dL — AB (ref 65–99)
Glucose-Capillary: 97 mg/dL (ref 65–99)
Glucose-Capillary: 184 mg/dL — ABNORMAL HIGH (ref 65–99)
Glucose-Capillary: 98 mg/dL (ref 65–99)

## 2014-10-22 LAB — CBC
HEMATOCRIT: 40.6 % (ref 39.0–52.0)
Hemoglobin: 13.2 g/dL (ref 13.0–17.0)
MCH: 28.9 pg (ref 26.0–34.0)
MCHC: 32.5 g/dL (ref 30.0–36.0)
MCV: 88.8 fL (ref 78.0–100.0)
Platelets: 238 10*3/uL (ref 150–400)
RBC: 4.57 MIL/uL (ref 4.22–5.81)
RDW: 14.4 % (ref 11.5–15.5)
WBC: 6.3 10*3/uL (ref 4.0–10.5)

## 2014-10-22 LAB — VANCOMYCIN, TROUGH: Vancomycin Tr: 24 ug/mL — ABNORMAL HIGH (ref 10.0–20.0)

## 2014-10-22 MED ORDER — VANCOMYCIN HCL IN DEXTROSE 1-5 GM/200ML-% IV SOLN
1000.0000 mg | Freq: Two times a day (BID) | INTRAVENOUS | Status: DC
  Administered 2014-10-22 – 2014-10-25 (×8): 1000 mg via INTRAVENOUS
  Filled 2014-10-22 (×15): qty 200

## 2014-10-22 NOTE — Evaluation (Signed)
Physical Therapy Evaluation Patient Details Name: [name] MRN: [id number] DOB: Feb 14, 1965 Today's Date: 10/22/2014   History of Present Illness  Pt is a 49 year old male admitted from [place] for tx of RLE cellulitis.  Clinical Impression  Pt is seen for evaluation.  He was very pleasant and cooperative, reports that he had been at [facility] in order to "build up my left quadriceps".  He has had bilateral TKR and the left one will need a revision.  The orthopedist wanted the leg stronger before doing the surgery.  Pt has cellulitis of the right calf with moderated edema.  On muscle testing, strength of both LEs is WNL.  He is independent in transfers.  He is able to ambulate with a straight cane for 150' and able to ascend/descend a flight of steps independently.  His gait pattern has some abnormalities due to old deformity but this does not affect his stability with gait.  He does have visible atrophy of the left quadriceps and it does fatigue rapidly.  If he chooses to do OP PT at some point, that would be appropriate.  Currently, he has lost his drivers license.    Follow Up Recommendations No PT follow up    Equipment Recommendations  None recommended by PT    Recommendations for Other Services   none    Precautions / Restrictions Precautions Precautions: None Restrictions Weight Bearing Restrictions: No      Mobility  Bed Mobility Overal bed mobility: Independent                Transfers Overall transfer level: Independent                  Ambulation/Gait Ambulation/Gait assistance: Modified independent (Device/Increase time) Ambulation Distance (Feet): 150 Feet Assistive device: Straight cane Gait Pattern/deviations: WFL(Within Functional Limits)   Gait velocity interpretation: >2.62 ft/sec, indicative of independent community ambulator General Gait Details: pt has internal rotation of both hips during gait but gait is stable  Stairs Stairs: Yes Stairs  assistance: Modified independent (Device/Increase time) Stair Management: One rail Right;With cane;Step to pattern;Forwards Number of Stairs: 10    Wheelchair Mobility    Modified Rankin (Stroke Patients Only)       Balance Overall balance assessment: No apparent balance deficits (not formally assessed)                                           Pertinent Vitals/Pain Pain Assessment: No/denies pain    Home Living Family/patient expects to be discharged to:: Private residence Living Arrangements: Alone Available Help at Discharge: Family;Available PRN/intermittently Type of Home: Mobile home Home Access: Stairs to enter Entrance Stairs-Rails: Right;Left;Can reach both Entrance Stairs-Number of Steps: 8 Home Layout: One level Home Equipment: Cane - single point;Walker - 2 wheels;Bedside commode      Prior Function Level of Independence: Independent with assistive device(s)         Comments: ambulates with a cane     Hand Dominance   Dominant Hand: Right    Extremity/Trunk Assessment               Lower Extremity Assessment: LLE deficits/detail   LLE Deficits / Details: moderate left knee crepitus with flexion, pt stated  that it needs to be revised as it is "worn out"...strength is good in quadriceps but flexion only to about 100*  Cervical /  Trunk Assessment: Normal  Communication   Communication: No difficulties  Cognition Arousal/Alertness: Awake/alert Behavior During Therapy: WFL for tasks assessed/performed Overall Cognitive Status: Within Functional Limits for tasks assessed                      General Comments      Exercises        Assessment/Plan    PT Assessment Patent does not need any further PT services  PT Diagnosis     PT Problem List    PT Treatment Interventions     PT Goals (Current goals can be found in the Care Plan section) Acute Rehab PT Goals PT Goal Formulation: All assessment and  education complete, DC therapy    Frequency     Barriers to discharge        Co-evaluation               End of Session Equipment Utilized During Treatment: Gait belt Activity Tolerance: Patient tolerated treatment well Patient left: in bed;with call bell/phone within reach           Time: 1421-1441 PT Time Calculation (min) (ACUTE ONLY): 20 min   Charges:   PT Evaluation $Initial PT Evaluation Tier I: 1 Procedure     PT G CodesSable Feil  PT 10/22/2014, 2:59 PM [phone]

## 2014-10-22 NOTE — Progress Notes (Signed)
[facility] PROGRESS NOTE  Camar Guyton QMG:[id number] DOB: [birth year]    PCP:   No PCP Per Patient   HPI:   Omair Dettmer is an 49 y.o. male admitted yesterday for diabetic cellulitis. He was started on IV van. His cellulitis is slightly better today. The swelling has improved. He requested Nicotine Patch. His cellulitis has improved.   Rewiew of Systems:  Constitutional: Negative for malaise, fever and chills. No significant weight loss or weight gain Eyes: Negative for eye pain, redness and discharge, diplopia, visual changes, or flashes of light. ENMT: Negative for ear pain, hoarseness, nasal congestion, sinus pressure and sore throat. No headaches; tinnitus, drooling, or problem swallowing. Cardiovascular: Negative for chest pain, palpitations, diaphoresis, dyspnea and peripheral edema. ; No orthopnea, PND Respiratory: Negative for cough, hemoptysis, wheezing and stridor. No pleuritic chestpain. Gastrointestinal: Negative for nausea, vomiting, diarrhea, constipation, abdominal pain, melena, blood in stool, hematemesis, jaundice and rectal bleeding.    Genitourinary: Negative for frequency, dysuria, incontinence,flank pain and hematuria; Musculoskeletal: Negative for back pain and neck pain. Negative for swelling and trauma.;  Skin: .The area of redness has improved.  There is still erythema on the dorsal aspect of the foot and the lower leg.  Neuro: Negative for headache, lightheadedness and neck stiffness. Negative for weakness, altered level of consciousness , altered mental status, extremity weakness, burning feet, involuntary movement, seizure and syncope.  Psych: negative for anxiety, depression, insomnia, tearfulness, panic attacks, hallucinations, paranoia, suicidal or homicidal ideation    Past Medical History  Diagnosis Date  . Diabetic foot ulcer ([place])   . ETOH abuse   . Anxiety   . Open wound     bottom of foot  . Diabetes mellitus without complication (HCC)     borderline  . Neuromuscular disorder (HCC)     neuropathy  . GERD (gastroesophageal reflux disease)     tums  . Pneumonia ~ 2012  . History of blood transfusion     "related to left knee OR; probably right hip too" (04/21/2012)  . Stroke [facility]) 2008    "they said I might have had one during right hip replacement" (04/21/2012)  . Arthritis     "everywhere" (04/21/2012)  . Mental disorder   . Depression   . DDD (degenerative disc disease)   . Cellulitis and abscess of foot 12/19/2013    LEFT FOOT    Past Surgical History  Procedure Laterality Date  . Total hip arthroplasty Right 2008  . Total knee arthroplasty Left 2006  . Joint replacement    . Lung lobectomy Left ~ 2006  . Revision total hip arthroplasty Right 2008    "4-5 months after replacement" (04/21/2012)  . Knee arthroscopy Bilateral 1980's/1990's  . Lung lobectomy    . Metatarsal osteotomy  10/29/2011    Procedure: METATARSAL OSTEOTOMY;  Surgeon: [name], MD;  Location: [place];  Service: Orthopedics;  Laterality: Left;  Left 1st Metatarsal Dorsal Closing Wedge   . Total knee arthroplasty Right 04/20/2012  . Total knee arthroplasty Right 04/20/2012    Procedure: TOTAL KNEE ARTHROPLASTY;  Surgeon: [name], MD;  Location: [place];  Service: Orthopedics;  Laterality: Right;  Right Total Knee Arthroplasty    Medications:  HOME MEDS: Prior to Admission medications   Medication Sig Start Date End Date Taking? Authorizing Provider  clonazePAM (KLONOPIN) 1 MG tablet Take 1 mg by mouth 4 (four) times daily.   Yes Historical Provider, MD  escitalopram (LEXAPRO) 20 MG tablet Take 1  tablet (20 mg total) by mouth every morning. For depression 03/29/13  Yes Encarnacion Slates, NP  gabapentin (NEURONTIN) 400 MG capsule Take 1 capsule (400 mg total) by mouth 4 (four) times daily. For substance withdrawal syndrome 03/29/13  Yes Encarnacion Slates, NP  metFORMIN (GLUCOPHAGE) 500 MG tablet Take 1 tablet (500 mg total) by mouth 2 (two) times daily  with a meal. 01/15/14  Yes Philemon Kingdom, MD  mirtazapine (REMERON) 30 MG tablet Take 30 mg by mouth at bedtime.   Yes Historical Provider, MD  QUEtiapine (SEROQUEL) 200 MG tablet Take 1 tablet (200 mg total) by mouth at bedtime. For mood control 03/29/13  Yes Encarnacion Slates, NP  HYDROcodone-acetaminophen (NORCO/VICODIN) 5-325 MG per tablet Take 1 tablet by mouth every 4 (four) hours as needed. Patient not taking: Reported on 10/20/2014 07/03/14   Evalee Jefferson, PA-C  LORazepam (ATIVAN) 1 MG tablet Take 1 tablet (1 mg total) by mouth 3 (three) times daily as needed for anxiety. Patient not taking: Reported on 10/20/2014 08/30/14   Nat Christen, MD  methocarbamol (ROBAXIN) 500 MG tablet Take 2 tablets (1,000 mg total) by mouth every 8 (eight) hours as needed for muscle spasms. Patient not taking: Reported on 10/20/2014 05/02/14   [name], MD  oxyCODONE-acetaminophen (PERCOCET/ROXICET) 5-325 MG per tablet Take 1 tablet by mouth every 4 (four) hours as needed. Patient not taking: Reported on 10/20/2014 06/03/14   Evalee Jefferson, PA-C  promethazine (PHENERGAN) 25 MG tablet Take 1 tablet (25 mg total) by mouth every 6 (six) hours as needed. Patient not taking: Reported on 10/20/2014 08/30/14   Nat Christen, MD     Allergies:  Allergies  Allergen Reactions  . Benadryl [Diphenhydramine Hcl] Other (See Comments)    Pt states leg spasm  . Ibuprofen Other (See Comments)    "upset stomach"  . Trazodone And Nefazodone Other (See Comments)    Leg Spasms.     Social History:   reports that he has been smoking Cigarettes.  He has a 30 pack-year smoking history. He has never used smokeless tobacco. He reports that he uses illicit drugs (Cocaine). He reports that he does not drink alcohol.  Family History: Family History  Problem Relation Age of Onset  . Diabetes Father      Physical Exam: Filed Vitals:   10/20/14 2242 10/21/14 0504 10/21/14 1500 10/22/14 0614  BP: 119/75 129/76 134/68 146/88  Pulse: 77  94 87 86  Temp: 98 F (36.7 C) 97.9 F (36.6 C) 98.3 F (36.8 C) 97.6 F (36.4 C)  TempSrc: Oral Oral Oral Oral  Resp: 16 16 18 16   Height:      Weight:      SpO2: 99% 96% 97% 98%   Blood pressure 146/88, pulse 86, temperature 97.6 F (36.4 C), temperature source Oral, resp. rate 16, height 6\' 3"  (1.905 m), weight 98.521 kg (217 lb 3.2 oz), SpO2 98 %.  GEN:  Pleasant patient lying in the stretcher in no acute distress; cooperative with exam. PSYCH:  alert and oriented x4; does not appear anxious or depressed; affect is appropriate. HEENT: Mucous membranes pink and anicteric; PERRLA; EOM intact; no cervical lymphadenopathy nor thyromegaly or carotid bruit; no JVD; There were no stridor. Neck is very supple. Breasts:: Not examined CHEST WALL: No tenderness CHEST: Normal respiration, clear to auscultation bilaterally.  HEART: Regular rate and rhythm.  There are no murmur, rub, or gallops.   BACK: No kyphosis or scoliosis; no CVA tenderness ABDOMEN: soft  and non-tender; no masses, no organomegaly, normal abdominal bowel sounds; no pannus; no intertriginous candida. There is no rebound and no distention. Rectal Exam: Not done EXTREMITIES: No bone or joint deformity; age-appropriate arthropathy of the hands and knees; no edema; no ulcerations.  There is no calf tenderness. Genitalia: not examined PULSES: 2+ and symmetric SKIN: There is still lower extremity erythema and redness on the dorsal right foot.  CNS: Cranial nerves 2-12 grossly intact no focal lateralizing neurologic deficit.  Speech is fluent; uvula elevated with phonation, facial symmetry and tongue midline. DTR are normal bilaterally, cerebella exam is intact, barbinski is negative and strengths are equaled bilaterally.  No sensory loss.   Labs on Admission:  Basic Metabolic Panel:  Recent Labs Lab 10/20/14 1250 10/21/14 0619 10/22/14 0657  NA 137 139 141  K 3.5 3.7 4.0  CL 101 106 106  CO2 28 27 29   GLUCOSE 99 154*  104*  BUN 14 11 13   CREATININE 1.06 0.99 1.05  CALCIUM 8.6* 8.6* 9.3   CBC:  Recent Labs Lab 10/20/14 1250 10/21/14 0619 10/22/14 0657  WBC 9.5 4.8 6.3  NEUTROABS 6.5 2.8  --   HGB 13.0 12.6* 13.2  HCT 38.9* 38.9* 40.6  MCV 87.6 88.2 88.8  PLT 193 203 238   Cardiac Enzymes: No results for input(s): CKTOTAL, CKMB, CKMBINDEX, TROPONINI in the last 168 hours.  CBG:  Recent Labs Lab 10/21/14 1138 10/21/14 1636 10/21/14 2204 10/22/14 0747 10/22/14 1120  GLUCAP 148* 90 124* 97 184*    Assessment/Plan Present on Admission:  . Cellulitis of right lower extremity . Cellulitis  PLAN:  Diabetic cellultis:  Improving.  Will continue Vancomycin.  Doing well on Nicoderm for tobacco cessation.  For DM, will continue with Metformin and SSI.  CBGs are tightly controlled.   Other plans as per orders.  Code Status: FULL Haskel Khan, MD. [facility] Pager [phone] 7pm to 7am.  10/22/2014, 1:14 PM

## 2014-10-22 NOTE — Progress Notes (Signed)
ANTIBIOTIC CONSULT NOTE   Pharmacy Consult for Vancomycin Indication: cellulitis  Allergies  Allergen Reactions  . Benadryl [Diphenhydramine Hcl] Other (See Comments)    Pt states leg spasm  . Ibuprofen Other (See Comments)    "upset stomach"  . Trazodone And Nefazodone Other (See Comments)    Leg Spasms.     Patient Measurements: Height: 6\' 3"  (190.5 cm) Weight: 217 lb 3.2 oz (98.521 kg) IBW/kg (Calculated) : 84.5 Adjusted Body Weight:   Vital Signs: Temp: 97.6 F (36.4 C) (10/03 0614) Temp Source: Oral (10/03 0614) BP: 146/88 mmHg (10/03 9233) Pulse Rate: 86 (10/03 0614) Intake/Output from previous day: 10/02 0701 - 10/03 0700 In: 1240 [P.O.:1240] Out: 4 [Urine:4] Intake/Output from this shift:    Labs:  Recent Labs  10/20/14 1250 10/21/14 0619 10/22/14 0657  WBC 9.5 4.8 6.3  HGB 13.0 12.6* 13.2  PLT 193 203 238  CREATININE 1.06 0.99  --    Estimated Creatinine Clearance: 105.5 mL/min (by C-G formula based on Cr of 0.99).  Recent Labs  10/22/14 0657  [place] 24*     Microbiology: No results found for this or any previous visit (from the past 720 hour(s)).  Medical History: Past Medical History  Diagnosis Date  . Diabetic foot ulcer ([place])   . ETOH abuse   . Anxiety   . Open wound     bottom of foot  . Diabetes mellitus without complication (HCC)     borderline  . Neuromuscular disorder (HCC)     neuropathy  . GERD (gastroesophageal reflux disease)     tums  . Pneumonia ~ 2012  . History of blood transfusion     "related to left knee OR; probably right hip too" (04/21/2012)  . Stroke [facility]) 2008    "they said I might have had one during right hip replacement" (04/21/2012)  . Arthritis     "everywhere" (04/21/2012)  . Mental disorder   . Depression   . DDD (degenerative disc disease)   . Cellulitis and abscess of foot 12/19/2013    LEFT FOOT    Assessment: 49 yo male being treated with vancomycin for cellulitis.  His vanc trough today  is supratherapeutic.   Goal of Therapy:  Vancomycin trough level 10-15 mcg/ml  Plan:  Change Vancomycin 1 GM IV q12 hours Monitor renal function, cultures and clinical progress   Alanta Scobey [place] 10/22/2014,7:36 AM

## 2014-10-22 NOTE — Clinical Social Work Note (Signed)
Clinical Social Work Assessment  Patient Details  Name: [name] MRN: [id number] Date of Birth: 06/09/1965  Date of referral:  10/22/14               Reason for consult:  Other (Comment Required) (From [facility])                Permission sought to share information with:    Permission granted to share information::     Name::        Agency::     Relationship::     Contact Information:     Housing/Transportation Living arrangements for the past 2 months:  [place] of Information:  Patient Patient Interpreter Needed:  None Criminal Activity/Legal Involvement Pertinent to Current Situation/Hospitalization:  No - Comment as needed Significant Relationships:  Parents, Siblings Lives with:  Facility Resident Do you feel safe going back to the place where you live?  Yes Need for family participation in patient care:  Yes (Comment)  Care giving concerns:  Pt from SNF, but will return home alone.    Social Worker assessment / plan:  CSW received call from [facility] reporting that pt left their facility and got high. She states that he has been there for short term rehab and has progressed to walking with cane. Debbie shared that facility would not be willing to accept pt back due to substance use. CSW met with pt at bedside. Pt stated he was "groggy" but after a few minutes was able to have conversation. Pt denies any substance use recently. He said that he went to alcohol detox in July and has been sober since then. Pt states he has been at [facility] for about one month and is ready to return home now. He has church and family who are willing to help as needed. Pt also shared that he is attending [facility] which he describes is like AA. He reports no CSW needs at this time. CSW will sign off. CM aware of plan to return home.   Employment status:  Disabled (Comment on whether or not currently receiving Disability) Insurance information:  Medicare PT  Recommendations:    Information / Referral to community resources:  Other (Comment Required) (pt plans to return home)  Patient/Family's Response to care: Pt plans to return home once d/c from hospital.   Patient/Family's Understanding of and Emotional Response to Diagnosis, Current Treatment, and Prognosis: Pt reports understanding of admission diagnosis and wants to d/c home.   Emotional Assessment Appearance:  Appears stated age Attitude/Demeanor/Rapport:  Other (Cooperative) Affect (typically observed):  Other (initially "groggy") Orientation:  Oriented to Self, Oriented to Place, Oriented to  Time, Oriented to Situation Alcohol / Substance use:  Other (history, but pt denies current use) Psych involvement (Current and /or in the community):  No (Comment)  Discharge Needs  Concerns to be addressed:  Discharge Planning Concerns Readmission within the last 30 days:  No Current discharge risk:  None Barriers to Discharge:  Continued Medical Work up   Salome Arnt, [place] 10/22/2014, 8:50 AM [phone]

## 2014-10-22 NOTE — Care Management Note (Signed)
Case Management Note  Patient Details  Name: [name] MRN: [id number] Date of Birth: [birth year]  Subjective/Objective:                  Pt admitted from [facility] with cellulitis. Pt uses a cane for ambulation. Anticipate pt will discharge home when medically stable.  Action/Plan: PT consult ordered. Will continue to follow for discharge planning needs.  Expected Discharge Date:                  Expected Discharge Plan:  [place]  In-House Referral:  Clinical Social Work  Discharge planning Services  CM Consult  Post Acute Care Choice:  Home Health Choice offered to:  Patient  DME Arranged:    DME Agency:     HH Arranged:    [place] Agency:     Status of Service:  In process, will continue to follow  Medicare Important Message Given:    Date Medicare IM Given:    Medicare IM give by:    Date Additional Medicare IM Given:    Additional Medicare Important Message give by:     If discussed at [place] of Stay Meetings, dates discussed:    Additional Comments:  Joylene Draft, RN 10/22/2014, 1:51 PM

## 2014-10-22 NOTE — Progress Notes (Signed)
Paged MD, pt requests ice for cellulitis sites. MD put order for ice to affected site.

## 2014-10-23 ENCOUNTER — Inpatient Hospital Stay (HOSPITAL_COMMUNITY): Payer: Medicare Other

## 2014-10-23 DIAGNOSIS — E119 Type 2 diabetes mellitus without complications: Secondary | ICD-10-CM | POA: Insufficient documentation

## 2014-10-23 DIAGNOSIS — R609 Edema, unspecified: Secondary | ICD-10-CM | POA: Insufficient documentation

## 2014-10-23 LAB — GLUCOSE, CAPILLARY
GLUCOSE-CAPILLARY: 119 mg/dL — AB (ref 65–99)
GLUCOSE-CAPILLARY: 116 mg/dL — AB (ref 65–99)
GLUCOSE-CAPILLARY: 160 mg/dL — AB (ref 65–99)
Glucose-Capillary: 112 mg/dL — ABNORMAL HIGH (ref 65–99)

## 2014-10-23 IMAGING — CR DG CHEST 2V
2 series · 2 of 2 positions shown · non-contrast
Comparison: CT chest 10/30/2008.

CLINICAL DATA: Status post fall.  Shortness of breath.

CHEST - 2 VIEW

[view not recorded (1 of 2)]
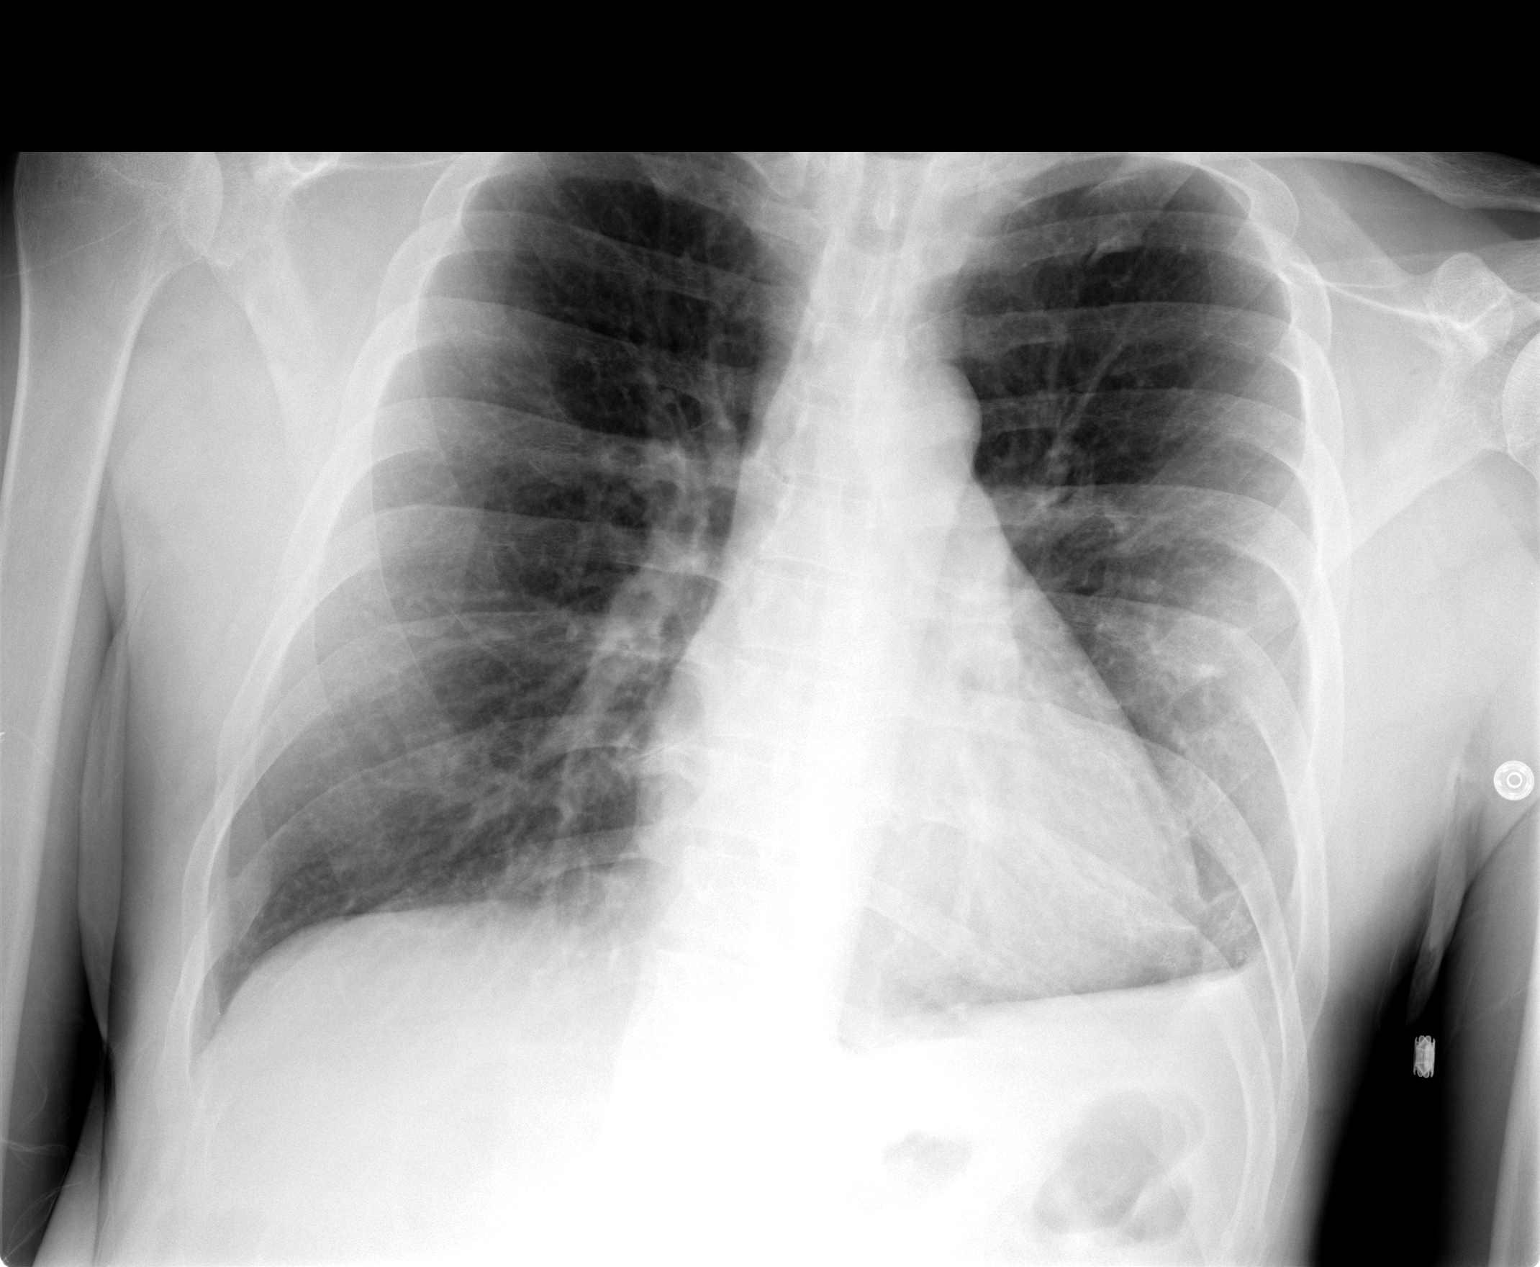

[view not recorded (2 of 2)]
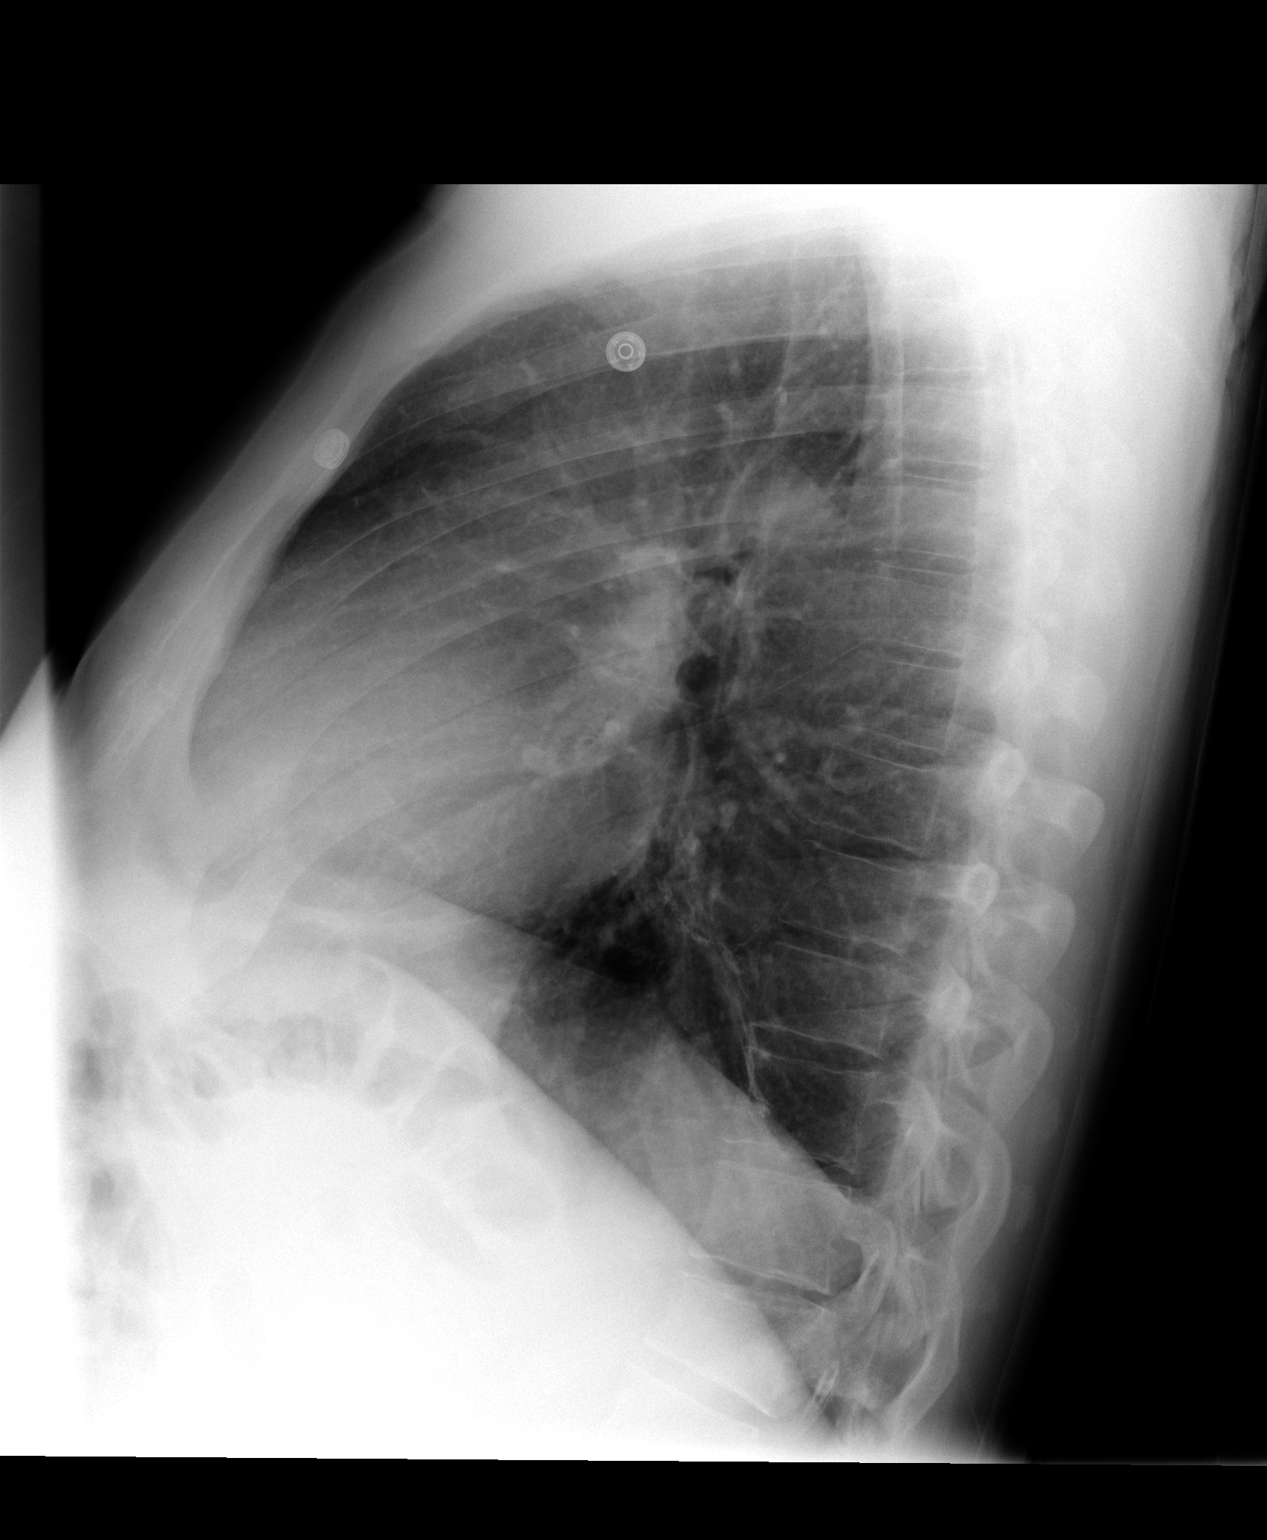

[2 of 2 positions shown; findings below may reference images not displayed]

FINDINGS: There is chronic blunting of the left costophrenic angle
compatible with scar as seen on CT scan.  Lungs appear clear.
Heart size upper normal.  No pneumothorax or pleural fluid.
IMPRESSION: No acute disease.

## 2014-10-23 MED ORDER — CEFTRIAXONE SODIUM 2 G IJ SOLR
2.0000 g | INTRAMUSCULAR | Status: DC
  Administered 2014-10-23 – 2014-10-26 (×4): 2 g via INTRAVENOUS
  Filled 2014-10-23 (×6): qty 2

## 2014-10-23 MED ORDER — OXYCODONE-ACETAMINOPHEN 5-325 MG PO TABS
1.5000 | ORAL_TABLET | ORAL | Status: DC | PRN
  Administered 2014-10-23 (×2): 1.5 via ORAL
  Administered 2014-10-24: 11:00:00 via ORAL
  Administered 2014-10-24 – 2014-10-27 (×17): 1.5 via ORAL
  Filled 2014-10-23 (×20): qty 2

## 2014-10-23 NOTE — Progress Notes (Signed)
[facility] PROGRESS NOTE  [name] HUD:[id number] DOB: [birth year]    PCP:   No PCP Per Patient   HPI:  [name] is an 49 y.o. male admitted yesterday for diabetic cellulitis. He was started on IV van. His cellulitis is slightly better today. The swelling has improved. He requested Nicotine Patch. His cellulitis has improved yesterday, but today, there is increase redness and swelling.   Rewiew of Systems:  Constitutional: Negative for malaise, fever and chills. No significant weight loss or weight gain Eyes: Negative for eye pain, redness and discharge, diplopia, visual changes, or flashes of light. ENMT: Negative for ear pain, hoarseness, nasal congestion, sinus pressure and sore throat. No headaches; tinnitus, drooling, or problem swallowing. Cardiovascular: Negative for chest pain, palpitations, diaphoresis, dyspnea and peripheral edema. ; No orthopnea, PND Respiratory: Negative for cough, hemoptysis, wheezing and stridor. No pleuritic chestpain. Gastrointestinal: Negative for nausea, vomiting, diarrhea, constipation, abdominal pain, melena, blood in stool, hematemesis, jaundice and rectal bleeding.    Genitourinary: Negative for frequency, dysuria, incontinence,flank pain and hematuria; Musculoskeletal: Negative for back pain and neck pain. Negative for swelling and trauma.;  Skin: . Negative for pruritus  Neuro: Negative for headache, lightheadedness and neck stiffness. Negative for weakness, altered level of consciousness , altered mental status, extremity weakness, burning feet, involuntary movement, seizure and syncope.  Psych: negative for anxiety, depression, insomnia, tearfulness, panic attacks, hallucinations, paranoia, suicidal or homicidal ideation    Past Medical History  Diagnosis Date  . Diabetic foot ulcer ([place])   . ETOH abuse   . Anxiety   . Open wound     bottom of foot  . Diabetes mellitus without complication (HCC)     borderline  .  Neuromuscular disorder (HCC)     neuropathy  . GERD (gastroesophageal reflux disease)     tums  . Pneumonia ~ 2012  . History of blood transfusion     "related to left knee OR; probably right hip too" (04/21/2012)  . Stroke [facility]) 2008    "they said I might have had one during right hip replacement" (04/21/2012)  . Arthritis     "everywhere" (04/21/2012)  . Mental disorder   . Depression   . DDD (degenerative disc disease)   . Cellulitis and abscess of foot 12/19/2013    LEFT FOOT    Past Surgical History  Procedure Laterality Date  . Total hip arthroplasty Right 2008  . Total knee arthroplasty Left 2006  . Joint replacement    . Lung lobectomy Left ~ 2006  . Revision total hip arthroplasty Right 2008    "4-5 months after replacement" (04/21/2012)  . Knee arthroscopy Bilateral 1980's/1990's  . Lung lobectomy    . Metatarsal osteotomy  10/29/2011    Procedure: METATARSAL OSTEOTOMY;  Surgeon: [name], MD;  Location: [place];  Service: Orthopedics;  Laterality: Left;  Left 1st Metatarsal Dorsal Closing Wedge   . Total knee arthroplasty Right 04/20/2012  . Total knee arthroplasty Right 04/20/2012    Procedure: TOTAL KNEE ARTHROPLASTY;  Surgeon: [name], MD;  Location: [place];  Service: Orthopedics;  Laterality: Right;  Right Total Knee Arthroplasty    Medications:  HOME MEDS: Prior to Admission medications   Medication Sig Start Date End Date Taking? Authorizing Provider  clonazePAM (KLONOPIN) 1 MG tablet Take 1 mg by mouth 4 (four) times daily.   Yes Historical Provider, MD  escitalopram (LEXAPRO) 20 MG tablet Take 1 tablet (20 mg total) by mouth every morning. For  depression 03/29/13  Yes Encarnacion Slates, NP  gabapentin (NEURONTIN) 400 MG capsule Take 1 capsule (400 mg total) by mouth 4 (four) times daily. For substance withdrawal syndrome 03/29/13  Yes Encarnacion Slates, NP  metFORMIN (GLUCOPHAGE) 500 MG tablet Take 1 tablet (500 mg total) by mouth 2 (two) times daily with a meal.  01/15/14  Yes Philemon Kingdom, MD  mirtazapine (REMERON) 30 MG tablet Take 30 mg by mouth at bedtime.   Yes Historical Provider, MD  QUEtiapine (SEROQUEL) 200 MG tablet Take 1 tablet (200 mg total) by mouth at bedtime. For mood control 03/29/13  Yes Encarnacion Slates, NP  HYDROcodone-acetaminophen (NORCO/VICODIN) 5-325 MG per tablet Take 1 tablet by mouth every 4 (four) hours as needed. Patient not taking: Reported on 10/20/2014 07/03/14   Evalee Jefferson, PA-C  LORazepam (ATIVAN) 1 MG tablet Take 1 tablet (1 mg total) by mouth 3 (three) times daily as needed for anxiety. Patient not taking: Reported on 10/20/2014 08/30/14   Nat Christen, MD  methocarbamol (ROBAXIN) 500 MG tablet Take 2 tablets (1,000 mg total) by mouth every 8 (eight) hours as needed for muscle spasms. Patient not taking: Reported on 10/20/2014 05/02/14   [name], MD  oxyCODONE-acetaminophen (PERCOCET/ROXICET) 5-325 MG per tablet Take 1 tablet by mouth every 4 (four) hours as needed. Patient not taking: Reported on 10/20/2014 06/03/14   Evalee Jefferson, PA-C  promethazine (PHENERGAN) 25 MG tablet Take 1 tablet (25 mg total) by mouth every 6 (six) hours as needed. Patient not taking: Reported on 10/20/2014 08/30/14   Nat Christen, MD     Allergies:  Allergies  Allergen Reactions  . Benadryl [Diphenhydramine Hcl] Other (See Comments)    Pt states leg spasm  . Ibuprofen Other (See Comments)    "upset stomach"  . Trazodone And Nefazodone Other (See Comments)    Leg Spasms.     Social History:   reports that he has been smoking Cigarettes.  He has a 30 pack-year smoking history. He has never used smokeless tobacco. He reports that he uses illicit drugs (Cocaine). He reports that he does not drink alcohol.  Family History: Family History  Problem Relation Age of Onset  . Diabetes Father      Physical Exam: Filed Vitals:   10/22/14 1421 10/22/14 2129 10/23/14 0700 10/23/14 1429  BP: 146/78 131/75 140/73 114/64  Pulse: 90 78 85 85   Temp: 98.1 F (36.7 C) 98.9 F (37.2 C) 97.4 F (36.3 C) 98.7 F (37.1 C)  TempSrc:  Oral Oral   Resp: 18 16 14 16   Height:      Weight:      SpO2: 96% 98% 100% 97%   Blood pressure 114/64, pulse 85, temperature 98.7 F (37.1 C), temperature source Oral, resp. rate 16, height 6\' 3"  (1.905 m), weight 98.521 kg (217 lb 3.2 oz), SpO2 97 %.  GEN:  Pleasant  patient lying in the stretcher in no acute distress; cooperative with exam. PSYCH:  alert and oriented x4; does not appear anxious or depressed; affect is appropriate. HEENT: Mucous membranes pink and anicteric; PERRLA; EOM intact; no cervical lymphadenopathy nor thyromegaly or carotid bruit; no JVD; There were no stridor. Neck is very supple. Breasts:: Not examined CHEST WALL: No tenderness CHEST: Normal respiration, clear to auscultation bilaterally.  HEART: Regular rate and rhythm.  There are no murmur, rub, or gallops.   BACK: No kyphosis or scoliosis; no CVA tenderness ABDOMEN: soft and non-tender; no masses, no organomegaly, normal abdominal  bowel sounds; no pannus; no intertriginous candida. There is no rebound and no distention. Rectal Exam: Not done EXTREMITIES: No bone or joint deformity; age-appropriate arthropathy of the hands and knees; no edema; no ulcerations.  There is no calf tenderness. Genitalia: not examined PULSES: 2+ and symmetric SKIN: erythema and swelling over the right lower extremities.  CNS: Cranial nerves 2-12 grossly intact no focal lateralizing neurologic deficit.  Speech is fluent; uvula elevated with phonation, facial symmetry and tongue midline. DTR are normal bilaterally, cerebella exam is intact, barbinski is negative and strengths are equaled bilaterally.  No sensory loss.   Labs on Admission:  Basic Metabolic Panel:  Recent Labs Lab 10/20/14 1250 10/21/14 0619 10/22/14 0657  NA 137 139 141  K 3.5 3.7 4.0  CL 101 106 106  CO2 28 27 29   GLUCOSE 99 154* 104*  BUN 14 11 13   CREATININE  1.06 0.99 1.05  CALCIUM 8.6* 8.6* 9.3   CBC:  Recent Labs Lab 10/20/14 1250 10/21/14 0619 10/22/14 0657  WBC 9.5 4.8 6.3  NEUTROABS 6.5 2.8  --   HGB 13.0 12.6* 13.2  HCT 38.9* 38.9* 40.6  MCV 87.6 88.2 88.8  PLT 193 203 238    CBG:  Recent Labs Lab 10/22/14 1120 10/22/14 1705 10/22/14 2127 10/23/14 0733 10/23/14 1148  GLUCAP 184* 98 145* 112* 119*    Assessment/Plan Present on Admission:  . Cellulitis of right lower extremity . Cellulitis  PLAN:  Will continue with IV Vancomycin, add Rocephin 2 g IV Q 24 hours.  I will obtain US to be sure there is no concommitant DVT (doubtful).  Will continue his meds for his DM.  He requested increase pain meds. Will increase Percocet to 7.5mg  every 4 hours PRN.   Other plans as per orders.  Code Status: FULL Haskel Khan, MD. [facility] Pager [phone] 7pm to 7am.  10/23/2014, 2:46 PM

## 2014-10-24 DIAGNOSIS — L03115 Cellulitis of right lower limb: Secondary | ICD-10-CM

## 2014-10-24 DIAGNOSIS — E119 Type 2 diabetes mellitus without complications: Secondary | ICD-10-CM

## 2014-10-24 LAB — GLUCOSE, CAPILLARY
GLUCOSE-CAPILLARY: 130 mg/dL — AB (ref 65–99)
Glucose-Capillary: 126 mg/dL — ABNORMAL HIGH (ref 65–99)
Glucose-Capillary: 111 mg/dL — ABNORMAL HIGH (ref 65–99)
Glucose-Capillary: 105 mg/dL — ABNORMAL HIGH (ref 65–99)

## 2014-10-24 LAB — BASIC METABOLIC PANEL
Anion gap: 9 (ref 5–15)
BUN: 13 mg/dL (ref 6–20)
CHLORIDE: 104 mmol/L (ref 101–111)
CO2: 27 mmol/L (ref 22–32)
CREATININE: 1.14 mg/dL (ref 0.61–1.24)
Calcium: 8.7 mg/dL — ABNORMAL LOW (ref 8.9–10.3)
GFR calc Af Amer: >60 mL/min (ref >60)
GFR calc non Af Amer: >60 mL/min (ref >60)
GLUCOSE: 118 mg/dL — AB (ref 65–99)
POTASSIUM: 3.6 mmol/L (ref 3.5–5.1)
SODIUM: 140 mmol/L (ref 135–145)

## 2014-10-24 MED ORDER — FUROSEMIDE 10 MG/ML IJ SOLN
20.0000 mg | Freq: Two times a day (BID) | INTRAMUSCULAR | Status: DC
  Administered 2014-10-24 – 2014-10-25 (×2): 20 mg via INTRAVENOUS
  Filled 2014-10-24 (×2): qty 2

## 2014-10-24 MED ORDER — KETOROLAC TROMETHAMINE 30 MG/ML IJ SOLN
30.0000 mg | Freq: Four times a day (QID) | INTRAMUSCULAR | Status: DC
  Administered 2014-10-24 – 2014-10-25 (×4): 30 mg via INTRAVENOUS
  Filled 2014-10-24 (×4): qty 1

## 2014-10-24 NOTE — Progress Notes (Signed)
[facility] PROGRESS NOTE  [name] HYW:[id number] DOB: 03-Dec-1965 DOA: 10/20/2014 PCP: No PCP Per Patient  Assessment/Plan: 1. Cellulitis of right lower extremity. Swelling and redness persists. Patient is on IV Vancomycin and Rocephin. U/s to of right lower extremity was negative to rule out DVT. Will continue IV abx and Percocet for pain. Keep LE elevated. Try low dose lasix since he may have an element of venous stasis 2. DM, type 2. CBG stable. Continue SSI  3. HTN, uncontrolled.  4. Tobacco Disorder.   Code Status: Full DVT Prophylaxis: SCDs Family discussion: Discussed plan in detail. No further concerns at this time. Disposition Plan: discharge  Home once improved   Consultants:    Procedures:    Antibiotics:  Vancomycin 10/3>>  Rocephin 10/4>>  HPI/Subjective: Still has pain in his right leg, still swollen and red  Objective: Filed Vitals:   10/24/14 0524  BP: 143/85  Pulse: 83  Temp: 98.4 F (36.9 C)  Resp: 18    Intake/Output Summary (Last 24 hours) at 10/24/14 0654 Last data filed at 10/23/14 1429  Gross per 24 hour  Intake    960 ml  Output      3 ml  Net    957 ml   Filed Weights   10/20/14 1058 10/20/14 1703  Weight: 90.719 kg (200 lb) 98.521 kg (217 lb 3.2 oz)    Exam:  General:  Appears comfortable, calm. Cardiovascular: Regular rate and rhythm, no murmur, rub or gallop.1-2+ edema b/l, R>L. Respiratory: Clear to auscultation bilaterally, no wheezes, rales or rhonchi. Normal respiratory effort. Abdomen: soft, ntnd Skin: erythema noted over RLE, warm to touch Musculoskeletal: grossly normal tone bilateral upper and lower extremities Psychiatric: grossly normal mood and affect, speech fluent and appropriate Neurologic: grossly non-focal.  Data Reviewed: Basic Metabolic Panel:  Recent Labs Lab 10/20/14 1250 10/21/14 0619 10/22/14 0657  NA 137 139 141  K 3.5 3.7 4.0  CL 101 106 106  CO2 28 27 29   GLUCOSE 99 154* 104*   BUN 14 11 13   CREATININE 1.06 0.99 1.05  CALCIUM 8.6* 8.6* 9.3   CBC:  Recent Labs Lab 10/20/14 1250 10/21/14 0619 10/22/14 0657  WBC 9.5 4.8 6.3  NEUTROABS 6.5 2.8  --   HGB 13.0 12.6* 13.2  HCT 38.9* 38.9* 40.6  MCV 87.6 88.2 88.8  PLT 193 203 238    CBG:  Recent Labs Lab 10/22/14 2127 10/23/14 0733 10/23/14 1148 10/23/14 1636 10/23/14 2045  GLUCAP 145* 112* 119* 116* 160*      Studies: US Venous Img Lower Unilateral Right  10/23/2014   CLINICAL DATA:  Right foot swelling for 1 week.  EXAM: RIGHT LOWER EXTREMITY VENOUS DUPLEX ULTRASOUND  TECHNIQUE: Doppler venous assessment of the right lower extremity deep venous system was performed, including characterization of spectral flow, compressibility, and phasicity.  COMPARISON:  None.  FINDINGS: There is complete compressibility of the right common femoral, femoral, and popliteal veins. Doppler analysis demonstrates respiratory phasicity and augmentation of flow upon calf compression.  Subcutaneous edema is present in the calf. No evidence of superficial vein or calf vein thrombosis.  9 mm short axis diameter right inguinal node is demonstrated  IMPRESSION: No evidence of right lower extremity DVT.  No acute pathology.   Electronically Signed   By: Marybelle Killings M.D.   On: 10/23/2014 16:34    Scheduled Meds: . cefTRIAXone (ROCEPHIN)  IV  2 g Intravenous Q24H  . escitalopram  20 mg Oral q morning -  10a  . gabapentin  400 mg Oral QID  . heparin  5,000 Units Subcutaneous 3 times per day  . insulin aspart  0-15 Units Subcutaneous TID WC  . insulin aspart  0-5 Units Subcutaneous QHS  . metFORMIN  500 mg Oral BID WC  . mirtazapine  30 mg Oral QHS  . nicotine  14 mg Transdermal Daily  . QUEtiapine  200 mg Oral QHS  . vancomycin  1,000 mg Intravenous Q12H   Continuous Infusions:   Active Problems:   Cellulitis   Cellulitis of right lower extremity   Diabetes mellitus type 2 in nonobese (HCC)   Swelling    Time  spent:25 minutes    Kathie Dike, MD  [facility] Pager [phone]. If 7PM-7AM, please contact night-coverage at [email or web address], password [facility] 10/24/2014, 6:54 AM  LOS: 4 days

## 2014-10-25 DIAGNOSIS — F17200 Nicotine dependence, unspecified, uncomplicated: Secondary | ICD-10-CM

## 2014-10-25 LAB — BASIC METABOLIC PANEL
ANION GAP: 5 (ref 5–15)
BUN: 16 mg/dL (ref 6–20)
CO2: 27 mmol/L (ref 22–32)
Calcium: 8 mg/dL — ABNORMAL LOW (ref 8.9–10.3)
Chloride: 102 mmol/L (ref 101–111)
Creatinine, Ser: 1.16 mg/dL (ref 0.61–1.24)
GFR calc Af Amer: >60 mL/min (ref >60)
Glucose, Bld: 117 mg/dL — ABNORMAL HIGH (ref 65–99)
POTASSIUM: 3.7 mmol/L (ref 3.5–5.1)
Sodium: 134 mmol/L — ABNORMAL LOW (ref 135–145)

## 2014-10-25 LAB — GLUCOSE, CAPILLARY
GLUCOSE-CAPILLARY: 91 mg/dL (ref 65–99)
GLUCOSE-CAPILLARY: 142 mg/dL — AB (ref 65–99)
Glucose-Capillary: 158 mg/dL — ABNORMAL HIGH (ref 65–99)
Glucose-Capillary: 121 mg/dL — ABNORMAL HIGH (ref 65–99)

## 2014-10-25 NOTE — Progress Notes (Signed)
PROGRESS NOTE  [name] IWP:[id number] DOB: Jun 19, 1965 DOA: 10/20/2014 PCP: No PCP Per Patient  Summary: 49 yo male with PMH of noncompliant DM, diabetic foot ulcer, HTN, HLD, GERD, and a CVA presented  with swelling of right lower extremity. While in the ED, labs revealed WBC 9.5, Hgb 13, creatinine 1.06 and  platelet count 193. Venous U/S of right lower extremity showed no evidence of right lower extremity DVT. He was admitted for further management.   Assessment/Plan: 1. Cellulitis of right lower extremity, slow to improve. No evidence of complicating features. U/s to of right lower extremity was negative for DVT.    2. DM, type 2. CBG stable. Continue SSI  3. HTN, stable.  4. Tobacco use disorder.     Stable, plan to continue IV abx   Anticipate discharge next 48 hours  Code Status: Full DVT Prophylaxis: SCDs Family discussion: Discussed plan in detail. No further concerns at this time. Disposition Plan: Dischargehome once improved  Murray Hodgkins, MD  [facility]  Pager [phone] If 7PM-7AM, please contact night-coverage at [email or web address], password [email or web address] 10/25/2014, 7:59 AM  LOS: 5 days   Consultants:    Procedures:    Antibiotics:  Vancomycin 10/3>>  Rocephin 10/4>>  HPI/Subjective: No real change in RLE pain, hurts to walk on. Eating ok.  Objective: Filed Vitals:   10/24/14 0524 10/24/14 1524 10/24/14 2149 10/25/14 0534  BP: 143/85 128/74 143/92 117/80  Pulse: 83 80 77 73  Temp: 98.4 F (36.9 C) 97.7 F (36.5 C) 98.3 F (36.8 C) 98.6 F (37 C)  TempSrc: Oral Oral Oral Oral  Resp: 18 18 20 20   Height:      Weight:      SpO2: 98% 95% 98% 95%    Intake/Output Summary (Last 24 hours) at 10/25/14 0759 Last data filed at 10/24/14 1821  Gross per 24 hour  Intake   1440 ml  Output      5 ml  Net   1435 ml     Filed Weights   10/20/14 1058 10/20/14 1703  Weight: 90.719 kg (200 lb) 98.521 kg (217 lb 3.2 oz)    Exam: Afebrile, not  hypoxic General:  Appears comfortable, calm. Cardiovascular: Regular rate and rhythm, no murmur, rub or gallop. No left lower extremity edema. 2-3+ RLE edema. Respiratory: Clear to auscultation bilaterally, no wheezes, rales or rhonchi. Normal respiratory effort. Abdomen: soft, ntnd Skin: minimal erythema RLE; marked edema. Pain with palpation lower leg to ankle. One bulla over first metatarsal head. No drainage noted. Musculoskeletal: grossly normal tone bilateral upper and lower extremities. Walks without assistance. Psychiatric: grossly normal mood and affect, speech fluent and appropriate Neurologic: grossly non-focal.   New data reviewed:  BMP unremarkable. Sodium 134, Postassium 3.7, Creatinine 1.16, BUN 16  Pertinent data since admission:  Venous US RLE No evidence of right lower extremity DVT. No acute pathology.  Pending data:    Scheduled Meds: . cefTRIAXone (ROCEPHIN)  IV  2 g Intravenous Q24H  . escitalopram  20 mg Oral q morning - 10a  . furosemide  20 mg Intravenous BID  . gabapentin  400 mg Oral QID  . heparin  5,000 Units Subcutaneous 3 times per day  . insulin aspart  0-15 Units Subcutaneous TID WC  . insulin aspart  0-5 Units Subcutaneous QHS  . ketorolac  30 mg Intravenous 4 times per day  . mirtazapine  30 mg Oral QHS  . nicotine  14 mg Transdermal Daily  . QUEtiapine  200 mg Oral QHS  . vancomycin  1,000 mg Intravenous Q12H   Continuous Infusions:   Active Problems:   Cellulitis   Cellulitis of right lower extremity   Diabetes mellitus type 2 in nonobese (HCC)   Swelling   Time spent 20 minutes  By signing my name below, I, Rosalie Doctor attest that this documentation has been prepared under the direction and in the presence of Murray Hodgkins, MD Electronically signed: Rosalie Doctor, Scribe.   10/25/2014   I personally performed the services described in this documentation. All medical record entries made by the scribe were at my  direction. I have reviewed the chart and agree that the record reflects my personal performance and is accurate and complete. Murray Hodgkins, MD

## 2014-10-25 NOTE — Progress Notes (Signed)
ANTIBIOTIC CONSULT NOTE   Pharmacy Consult for Vancomycin Indication: cellulitis  Allergies  Allergen Reactions  . Benadryl [Diphenhydramine Hcl] Other (See Comments)    Pt states leg spasm  . Ibuprofen Other (See Comments)    "upset stomach"  . Trazodone And Nefazodone Other (See Comments)    Leg Spasms.    Patient Measurements: Height: 6\' 3"  (190.5 cm) Weight: 217 lb 3.2 oz (98.521 kg) IBW/kg (Calculated) : 84.5  Vital Signs: Temp: 98.6 F (37 C) (10/06 0534) Temp Source: Oral (10/06 0534) BP: 117/80 mmHg (10/06 0534) Pulse Rate: 73 (10/06 0534) Intake/Output from previous day: 10/05 0701 - 10/06 0700 In: 1440 [P.O.:1440] Out: 5 [Urine:4; Stool:1] Intake/Output from this shift: Total I/O In: 600 [P.O.:600] Out: -   Labs:  Recent Labs  10/24/14 0627 10/25/14 0653  CREATININE 1.14 1.16   Estimated Creatinine Clearance: 90 mL/min (by C-G formula based on Cr of 1.16). No results for input(s): VANCOTROUGH, VANCOPEAK, VANCORANDOM, GENTTROUGH, GENTPEAK, GENTRANDOM, TOBRATROUGH, TOBRAPEAK, TOBRARND, AMIKACINPEAK, AMIKACINTROU, AMIKACIN in the last 72 hours.  Microbiology: No results found for this or any previous visit (from the past 720 hour(s)).  Medical History: Past Medical History  Diagnosis Date  . Diabetic foot ulcer ([place])   . ETOH abuse   . Anxiety   . Open wound     bottom of foot  . Diabetes mellitus without complication (HCC)     borderline  . Neuromuscular disorder (HCC)     neuropathy  . GERD (gastroesophageal reflux disease)     tums  . Pneumonia ~ 2012  . History of blood transfusion     "related to left knee OR; probably right hip too" (04/21/2012)  . Stroke [facility]) 2008    "they said I might have had one during right hip replacement" (04/21/2012)  . Arthritis     "everywhere" (04/21/2012)  . Mental disorder   . Depression   . DDD (degenerative disc disease)   . Cellulitis and abscess of foot 12/19/2013    LEFT FOOT   Assessment: 49 yo male  being treated with vancomycin for cellulitis.  His recent Vanc trough was supratherapeutic.   Goal of Therapy:  Vancomycin trough level 10-15 mcg/ml  Plan:  Continue Vancomycin 1 GM IV q12 hours Repeat Vancomycin trough in AM. Monitor renal function, cultures and clinical progress Repeat Vancomycin trough in AM.   [name] 10/25/2014,12:57 PM

## 2014-10-26 DIAGNOSIS — F17200 Nicotine dependence, unspecified, uncomplicated: Secondary | ICD-10-CM

## 2014-10-26 DIAGNOSIS — R609 Edema, unspecified: Secondary | ICD-10-CM

## 2014-10-26 LAB — GLUCOSE, CAPILLARY
Glucose-Capillary: 126 mg/dL — ABNORMAL HIGH (ref 65–99)
Glucose-Capillary: 122 mg/dL — ABNORMAL HIGH (ref 65–99)

## 2014-10-26 LAB — BASIC METABOLIC PANEL
ANION GAP: 6 (ref 5–15)
BUN: 16 mg/dL (ref 6–20)
CHLORIDE: 104 mmol/L (ref 101–111)
CO2: 27 mmol/L (ref 22–32)
Calcium: 8.3 mg/dL — ABNORMAL LOW (ref 8.9–10.3)
Creatinine, Ser: 1.19 mg/dL (ref 0.61–1.24)
GFR calc non Af Amer: >60 mL/min (ref >60)
Glucose, Bld: 116 mg/dL — ABNORMAL HIGH (ref 65–99)
Potassium: 3.8 mmol/L (ref 3.5–5.1)
Sodium: 137 mmol/L (ref 135–145)

## 2014-10-26 LAB — VANCOMYCIN, TROUGH: Vancomycin Tr: 25 ug/mL — ABNORMAL HIGH (ref 10.0–20.0)

## 2014-10-26 MED ORDER — FUROSEMIDE 10 MG/ML IJ SOLN
20.0000 mg | Freq: Two times a day (BID) | INTRAMUSCULAR | Status: DC
  Administered 2014-10-26 – 2014-10-27 (×2): 20 mg via INTRAVENOUS
  Filled 2014-10-26 (×2): qty 2

## 2014-10-26 MED ORDER — VANCOMYCIN HCL 10 G IV SOLR
1500.0000 mg | INTRAVENOUS | Status: DC
  Administered 2014-10-26: 1500 mg via INTRAVENOUS
  Filled 2014-10-26 (×3): qty 1500

## 2014-10-26 NOTE — Care Management Important Message (Signed)
Important Message  Patient Details  Name: [name] MRN: [id number] Date of Birth: [birth year]   Medicare Important Message Given:  Yes-second notification given    Joylene Draft, RN 10/26/2014, 1:18 PM

## 2014-10-26 NOTE — Progress Notes (Signed)
[facility] PROGRESS NOTE  [name] FYB:[id number] DOB: Jun 14, 1965 DOA: 10/20/2014 PCP: No PCP Per Patient  Assessment/Plan: 1. Cellulitis of right lower extremity, improving. No evidence of complicating features. U/s of right lower extremity was negative for DVT.Continue IV abx and elevation of LE. Patient reports good UOP, likely element of venous stasis. Will continue low dose Lasix. If continues to improve, anticipate transition to oral abx tomorrow.  2. DM, type 2. CBG stable. Continue SSI  3. HTN, stable.  4. Tobacco use disorder. Counseled on the importance of smoking cessation.    Code Status: Full DVT Prophylaxis: SCDs Family Communication: No family at bedside. Discussed with patient who understands and has no concerns at this time. Disposition Plan: Anticipate discharge within 1-2 days.    Consultants:    Procedures:    Antibiotics:  Vancomycin 10/3>>  Rocephin 10/4>>  HPI/Subjective: Feels okay. Pain in his RLE is the same. Has an appetite and good UOP.   Objective: Filed Vitals:   10/26/14 0452  BP: 137/89  Pulse: 79  Temp: 98.7 F (37.1 C)  Resp: 18    Intake/Output Summary (Last 24 hours) at 10/26/14 0716 Last data filed at 10/26/14 0452  Gross per 24 hour  Intake   1930 ml  Output      3 ml  Net   1927 ml   Filed Weights   10/20/14 1058 10/20/14 1703  Weight: 90.719 kg (200 lb) 98.521 kg (217 lb 3.2 oz)    Exam:  General: NAD, looks comfortable Cardiovascular: RRR, S1, S2  Respiratory: clear bilaterally, No wheezing, rales or rhonchi Abdomen: soft, non tender, no distention , bowel sounds normal Musculoskeletal: erythema and edema in RLE improving, skin is less warm. Edema in LLE also improving   Data Reviewed: Basic Metabolic Panel:  Recent Labs Lab 10/20/14 1250 10/21/14 0619 10/22/14 0657 10/24/14 0627 10/25/14 0653  NA 137 139 141 140 134*  K 3.5 3.7 4.0 3.6 3.7  CL 101 106 106 104 102  CO2 28 27 29 27 27    GLUCOSE 99 154* 104* 118* 117*  BUN 14 11 13 13 16   CREATININE 1.06 0.99 1.05 1.14 1.16  CALCIUM 8.6* 8.6* 9.3 8.7* 8.0*    CBC:  Recent Labs Lab 10/20/14 1250 10/21/14 0619 10/22/14 0657  WBC 9.5 4.8 6.3  NEUTROABS 6.5 2.8  --   HGB 13.0 12.6* 13.2  HCT 38.9* 38.9* 40.6  MCV 87.6 88.2 88.8  PLT 193 203 238     CBG:  Recent Labs Lab 10/24/14 2008 10/25/14 0828 10/25/14 1120 10/25/14 1635 10/25/14 2032  GLUCAP 130* 158* 121* 91 142*    Scheduled Meds: . cefTRIAXone (ROCEPHIN)  IV  2 g Intravenous Q24H  . escitalopram  20 mg Oral q morning - 10a  . gabapentin  400 mg Oral QID  . heparin  5,000 Units Subcutaneous 3 times per day  . insulin aspart  0-15 Units Subcutaneous TID WC  . insulin aspart  0-5 Units Subcutaneous QHS  . mirtazapine  30 mg Oral QHS  . nicotine  14 mg Transdermal Daily  . QUEtiapine  200 mg Oral QHS  . vancomycin  1,000 mg Intravenous Q12H   Continuous Infusions:   Principal Problem:   Cellulitis of right lower extremity Active Problems:   Diabetes mellitus type 2 in nonobese (HCC)   Swelling   Tobacco use disorder   Time spent: 25 minutes   Yilia Sacca. MD  [facility] Pager [phone] If 7PM-7AM, please  contact night-coverage at [email or web address], password [facility] 10/26/2014, 7:16 AM  LOS: 6 days     By signing my name below, I, Rosalie Doctor, attest that this documentation has been prepared under the direction and in the presence of [facility]. MD Electronically Signed: Rosalie Doctor, Scribe. 10/26/2014 12:.38pm  I, Dr. Kathie Dike, personally performed the services described in this documentaiton. All medical record entries made by the scribe were at my direction and in my presence. I have reviewed the chart and agree that the record reflects my personal performance and is accurate and complete  Kathie Dike, MD, 10/26/2014 12:47 PM

## 2014-10-26 NOTE — Progress Notes (Signed)
ANTIBIOTIC CONSULT NOTE   Pharmacy Consult for Vancomycin Indication: cellulitis  Allergies  Allergen Reactions  . Benadryl [Diphenhydramine Hcl] Other (See Comments)    Pt states leg spasm  . Ibuprofen Other (See Comments)    "upset stomach"  . Trazodone And Nefazodone Other (See Comments)    Leg Spasms.    Patient Measurements: Height: 6\' 3"  (190.5 cm) Weight: 217 lb 3.2 oz (98.521 kg) IBW/kg (Calculated) : 84.5  Vital Signs: Temp: 98.7 F (37.1 C) (10/07 0452) Temp Source: Oral (10/07 0452) BP: 137/89 mmHg (10/07 0452) Pulse Rate: 79 (10/07 0452) Intake/Output from previous day: 10/06 0701 - 10/07 0700 In: 1930 [P.O.:1680; IV Piggyback:250] Out: 3 [Urine:3] Intake/Output from this shift: Total I/O In: 480 [P.O.:480] Out: -   Labs:  Recent Labs  10/24/14 0627 10/25/14 0653 10/26/14 0648  CREATININE 1.14 1.16 1.19   Estimated Creatinine Clearance: 87.8 mL/min (by C-G formula based on Cr of 1.19).  Recent Labs  10/26/14 0913  Leon 25*    Microbiology: No results found for this or any previous visit (from the past 720 hour(s)).  Medical History: Past Medical History  Diagnosis Date  . Diabetic foot ulcer ([place])   . ETOH abuse   . Anxiety   . Open wound     bottom of foot  . Diabetes mellitus without complication (HCC)     borderline  . Neuromuscular disorder (HCC)     neuropathy  . GERD (gastroesophageal reflux disease)     tums  . Pneumonia ~ 2012  . History of blood transfusion     "related to left knee OR; probably right hip too" (04/21/2012)  . Stroke [facility]) 2008    "they said I might have had one during right hip replacement" (04/21/2012)  . Arthritis     "everywhere" (04/21/2012)  . Mental disorder   . Depression   . DDD (degenerative disc disease)   . Cellulitis and abscess of foot 12/19/2013    LEFT FOOT   Assessment: 49 yo male being treated with vancomycin for cellulitis.  Vanc trough was supratherapeutic.  Renal fxn is stable.    Pharmacokinetic dosing service   Vancomycin single level analysis: Current dose being given: 1000 mg Current dosing interval:  12 hrs  Single level Trough Data:  Trough level obtained: 24 mcg/ml Timing of trough - Number of hours since last dose:  11 Hrs Desired peak:  30 mcg/ml  Desired trough: 11 mcg/ml  Estimated PK Parameters: --------------------------- New rate constant (kel): 0.043 hr-1 New half-life: 16.12 Hours New Vd from levels: 68.60  Liters  (0.7 L/kg)  Recommendations: ==================== Give Vancomycin  1500 mg  q 24 hrs. Infuse over 1.5 hrs Expected Cpeak: 32.9 mcg/ml    Expected Ctrough: 12.5 mcg/ml  Recommended labs and intervals: Measure Bun and Scr 3 times/week.   Thank you for the consult, will continue to follow.  Goal of Therapy:  Vancomycin trough level 10-15 mcg/ml  Plan:  Vancomycin 1500mg  IV q24hrs Monitor renal function, cultures and clinical progress Deescalate ABX when improved / appropriate (if No MRSA isolated)  Patrisha Hausmann A 10/26/2014,10:36 AM

## 2014-10-26 NOTE — Care Management Note (Signed)
Case Management Note  Patient Details  Name: [name] MRN: [id number] Date of Birth: 09-18-1965  Subjective/Objective:                    Action/Plan:   Expected Discharge Date:                  Expected Discharge Plan:  Home/Self Care  In-House Referral:  Clinical Social Work  Discharge planning Services  CM Consult, Follow-up appt scheduled  Post Acute Care Choice:  Home Health Choice offered to:  Patient  DME Arranged:    DME Agency:     HH Arranged:    [place] Agency:     Status of Service:  Completed, signed off  Medicare Important Message Given:  Yes-second notification given Date Medicare IM Given:    Medicare IM give by:    Date Additional Medicare IM Given:    Additional Medicare Important Message give by:     If discussed at [place] of Stay Meetings, dates discussed:    Additional Comments: Anticipate discharge over the weekend. No CM needs noted. Pt made aware of follow up appt made at [facility].  Christinia Gully Tancred, RN 10/26/2014, 1:34 PM

## 2014-10-27 LAB — BASIC METABOLIC PANEL
ANION GAP: 7 (ref 5–15)
BUN: 17 mg/dL (ref 6–20)
CHLORIDE: 106 mmol/L (ref 101–111)
CO2: 26 mmol/L (ref 22–32)
CREATININE: 1.22 mg/dL (ref 0.61–1.24)
Calcium: 8.6 mg/dL — ABNORMAL LOW (ref 8.9–10.3)
GFR calc non Af Amer: >60 mL/min (ref >60)
GLUCOSE: 184 mg/dL — AB (ref 65–99)
Potassium: 4.2 mmol/L (ref 3.5–5.1)
Sodium: 139 mmol/L (ref 135–145)

## 2014-10-27 LAB — GLUCOSE, CAPILLARY
GLUCOSE-CAPILLARY: 131 mg/dL — AB (ref 65–99)
GLUCOSE-CAPILLARY: 108 mg/dL — AB (ref 65–99)
Glucose-Capillary: 90 mg/dL (ref 65–99)
Glucose-Capillary: 169 mg/dL — ABNORMAL HIGH (ref 65–99)

## 2014-10-27 MED ORDER — FUROSEMIDE 40 MG PO TABS: 40.0000 mg | ORAL_TABLET | Freq: Every day | ORAL | Status: DC | PRN

## 2014-10-27 MED ORDER — DOXYCYCLINE HYCLATE 100 MG PO CAPS: 100.0000 mg | ORAL_CAPSULE | Freq: Two times a day (BID) | ORAL | Status: DC

## 2014-10-27 MED ORDER — OXYCODONE-ACETAMINOPHEN 5-325 MG PO TABS: 1.5000 | ORAL_TABLET | Freq: Four times a day (QID) | ORAL | Status: DC | PRN

## 2014-10-27 NOTE — Discharge Summary (Signed)
Physician Discharge Summary  [name] TKZ:[id number] DOB: 1966/01/05 DOA: 10/20/2014  PCP: No PCP Per Patient  Admit date: 10/20/2014 Discharge date: 10/27/2014  Time spent: 35 minutes  Recommendations for Outpatient Follow-up:  1. Follow up with PCP on 10/18 as previously scheduled.   Discharge Diagnoses:  Principal Problem:   Cellulitis of right lower extremity Active Problems:   Diabetes mellitus type 2 in nonobese [facility])   Swelling   Tobacco use disorder   Discharge Condition: Improved  Diet recommendation: Low sodium   Filed Weights   10/20/14 1058 10/20/14 1703  Weight: 90.719 kg (200 lb) 98.521 kg (217 lb 3.2 oz)    History of present illness:  49 yo male with PMH of noncompliant DM, diabetic foot ulcer, HTN, HLD, GERD, and a CVA presented with swelling of right lower extremity. While in the ED, labs revealed WBC 9.5, Hgb 13, creatinine 1.06 and platelet count 193. Venous U/S of right lower extremity showed no evidence of right lower extremity DVT. He was admitted for further management.   Hospital Course:  49 y/o male presented with RLE swelling and redness. Patient remained afebrile and with a normal WBC throughout hospital course. Cellulitis of the limb was treated with IV abx. It was felt that he may have had a component of venous stasis so he was started on low dose Lasix with significant reduction in swelling and redness. US of the extremity was negative for a DVT. He was transitioned to oral Doxycycline and Lasix PRN upon discharge. He has been advised to keep his lower extremity elevated and follow up with his PCP in 10 days as scheduled.   1. DM, type 2. CBG stable. Continue SSI  2. HTN, stable.  3. Tobacco use disorder. Counseled on the importance of smoking cessation.  Procedures:  none  Consultations:  none  Discharge Exam: Filed Vitals:   10/27/14 0712  BP: 126/71  Pulse: 98  Temp: 98.1 F (36.7 C)  Resp: 20     General: NAD, looks  comfortable  Cardiovascular: RRR, S1, S2   Respiratory: clear bilaterally, No wheezing, rales or rhonchi  Abdomen: soft, non tender, no distention , bowel sounds normal  Musculoskeletal: 1+ edema in the RLE with mild erythema. Large bolus lesion noted over plantar aspect of right foot. Overall edema is improving.   Discharge Instructions   Discharge Instructions    Diet - low sodium heart healthy    Complete by:  As directed      Increase activity slowly    Complete by:  As directed           Current Discharge Medication List    START taking these medications   Details  doxycycline (VIBRAMYCIN) 100 MG capsule Take 1 capsule (100 mg total) by mouth 2 (two) times daily. Qty: 14 capsule, Refills: 0    furosemide (LASIX) 40 MG tablet Take 1 tablet (40 mg total) by mouth daily as needed for fluid. Qty: 30 tablet, Refills: 0      CONTINUE these medications which have CHANGED   Details  oxyCODONE-acetaminophen (PERCOCET/ROXICET) 5-325 MG tablet Take 1.5 tablets by mouth every 6 (six) hours as needed for severe pain. Qty: 30 tablet, Refills: 0      CONTINUE these medications which have NOT CHANGED   Details  clonazePAM (KLONOPIN) 1 MG tablet Take 1 mg by mouth 4 (four) times daily.    escitalopram (LEXAPRO) 20 MG tablet Take 1 tablet (20 mg total) by mouth every morning. For  depression Qty: 30 tablet, Refills: 0    gabapentin (NEURONTIN) 400 MG capsule Take 1 capsule (400 mg total) by mouth 4 (four) times daily. For substance withdrawal syndrome Qty: 120 capsule, Refills: 0    metFORMIN (GLUCOPHAGE) 500 MG tablet Take 1 tablet (500 mg total) by mouth 2 (two) times daily with a meal. Qty: 180 tablet, Refills: 0    mirtazapine (REMERON) 30 MG tablet Take 30 mg by mouth at bedtime.    QUEtiapine (SEROQUEL) 200 MG tablet Take 1 tablet (200 mg total) by mouth at bedtime. For mood control Qty: 30 tablet, Refills: 0    promethazine (PHENERGAN) 25 MG tablet Take 1 tablet (25  mg total) by mouth every 6 (six) hours as needed. Qty: 15 tablet, Refills: 0      STOP taking these medications     HYDROcodone-acetaminophen (NORCO/VICODIN) 5-325 MG per tablet      LORazepam (ATIVAN) 1 MG tablet      methocarbamol (ROBAXIN) 500 MG tablet        Allergies  Allergen Reactions  . Benadryl [Diphenhydramine Hcl] Other (See Comments)    Pt states leg spasm  . Ibuprofen Other (See Comments)    "upset stomach"  . Trazodone And Nefazodone Other (See Comments)    Leg Spasms.    Follow-up Information    Follow up with Alphia Kava On 11/06/2014.   Why:  at 9:00   Contact information:   [address] [place] [address] [phone]        The results of significant diagnostics from this hospitalization (including imaging, microbiology, ancillary and laboratory) are listed below for reference.    Significant Diagnostic Studies: US Venous Img Lower Unilateral Right  10/23/2014   CLINICAL DATA:  Right foot swelling for 1 week.  EXAM: RIGHT LOWER EXTREMITY VENOUS DUPLEX ULTRASOUND  TECHNIQUE: Doppler venous assessment of the right lower extremity deep venous system was performed, including characterization of spectral flow, compressibility, and phasicity.  COMPARISON:  None.  FINDINGS: There is complete compressibility of the right common femoral, femoral, and popliteal veins. Doppler analysis demonstrates respiratory phasicity and augmentation of flow upon calf compression.  Subcutaneous edema is present in the calf. No evidence of superficial vein or calf vein thrombosis.  9 mm short axis diameter right inguinal node is demonstrated  IMPRESSION: No evidence of right lower extremity DVT.  No acute pathology.   Electronically Signed   By: Marybelle Killings M.D.   On: 10/23/2014 16:34    Labs: Basic Metabolic Panel:  Recent Labs Lab 10/22/14 0657 10/24/14 0627 10/25/14 0653 10/26/14 0648 10/27/14 0610  NA 141 140 134* 137 139  K 4.0 3.6 3.7 3.8 4.2   CL 106 104 102 104 106  CO2 29 27 27 27 26   GLUCOSE 104* 118* 117* 116* 184*  BUN 13 13 16 16 17   CREATININE 1.05 1.14 1.16 1.19 1.22  CALCIUM 9.3 8.7* 8.0* 8.3* 8.6*   CBC:  Recent Labs Lab 10/20/14 1250 10/21/14 0619 10/22/14 0657  WBC 9.5 4.8 6.3  NEUTROABS 6.5 2.8  --   HGB 13.0 12.6* 13.2  HCT 38.9* 38.9* 40.6  MCV 87.6 88.2 88.8  PLT 193 203 238   CBG:  Recent Labs Lab 10/26/14 0751 10/26/14 1129 10/26/14 1626 10/26/14 2025 10/27/14 0728  GLUCAP 126* 90 131* 122* 169*    Signed:  Jehanzeb Memon. MD  [facility] 10/27/2014, 11:55 AM   By signing my name below, I, Rosalie Doctor, attest  that this documentation has been prepared under the direction and in the presence of [employer]. MD Electronically Signed: Rosalie Doctor, Scribe. 10/27/2014 11:39am  I, Dr. Kathie Dike, personally performed the services described in this documentaiton. All medical record entries made by the scribe were at my direction and in my presence. I have reviewed the chart and agree that the record reflects my personal performance and is accurate and complete  Kathie Dike, MD, 10/27/2014 11:55 AM

## 2014-10-27 NOTE — Progress Notes (Signed)
10/27/2014 1:12 PM [name] to be D/C'd Home per MD order.  Discussed prescriptions and follow up appointments with the patient. Prescriptions given to patient, medication list explained in detail. Pt verbalized understanding.    Medication List    STOP taking these medications        HYDROcodone-acetaminophen 5-325 MG tablet  Commonly known as:  NORCO/VICODIN     LORazepam 1 MG tablet  Commonly known as:  ATIVAN     methocarbamol 500 MG tablet  Commonly known as:  ROBAXIN      TAKE these medications        clonazePAM 1 MG tablet  Commonly known as:  KLONOPIN  Take 1 mg by mouth 4 (four) times daily.     doxycycline 100 MG capsule  Commonly known as:  VIBRAMYCIN  Take 1 capsule (100 mg total) by mouth 2 (two) times daily.     escitalopram 20 MG tablet  Commonly known as:  LEXAPRO  Take 1 tablet (20 mg total) by mouth every morning. For depression     furosemide 40 MG tablet  Commonly known as:  LASIX  Take 1 tablet (40 mg total) by mouth daily as needed for fluid.     gabapentin 400 MG capsule  Commonly known as:  NEURONTIN  Take 1 capsule (400 mg total) by mouth 4 (four) times daily. For substance withdrawal syndrome     metFORMIN 500 MG tablet  Commonly known as:  GLUCOPHAGE  Take 1 tablet (500 mg total) by mouth 2 (two) times daily with a meal.     mirtazapine 30 MG tablet  Commonly known as:  REMERON  Take 30 mg by mouth at bedtime.     oxyCODONE-acetaminophen 5-325 MG tablet  Commonly known as:  PERCOCET/ROXICET  Take 1.5 tablets by mouth every 6 (six) hours as needed for severe pain.     promethazine 25 MG tablet  Commonly known as:  PHENERGAN  Take 1 tablet (25 mg total) by mouth every 6 (six) hours as needed.     QUEtiapine 200 MG tablet  Commonly known as:  SEROQUEL  Take 1 tablet (200 mg total) by mouth at bedtime. For mood control        Filed Vitals:   10/27/14 0712  BP: 126/71  Pulse: 98  Temp: 98.1 F (36.7 C)  Resp: 20    Skin  clean, dry and intact without evidence of skin break down, no evidence of skin tears noted. IV catheter discontinued intact. Site without signs and symptoms of complications. Dressing and pressure applied. Pt denies pain at this time. No complaints noted.  An After Visit Summary was printed and given to the patient. Patient escorted  and D/C home via private auto.  Dola Argyle

## 2014-11-15 IMAGING — CR DG HIP COMPLETE 2+V*R*
3 series · 3 of 3 positions shown · non-contrast
Comparison: None.

CLINICAL DATA: Status post fall.  Right hip pain.

RIGHT HIP - COMPLETE 2+ VIEW

[view not recorded (1 of 3)]
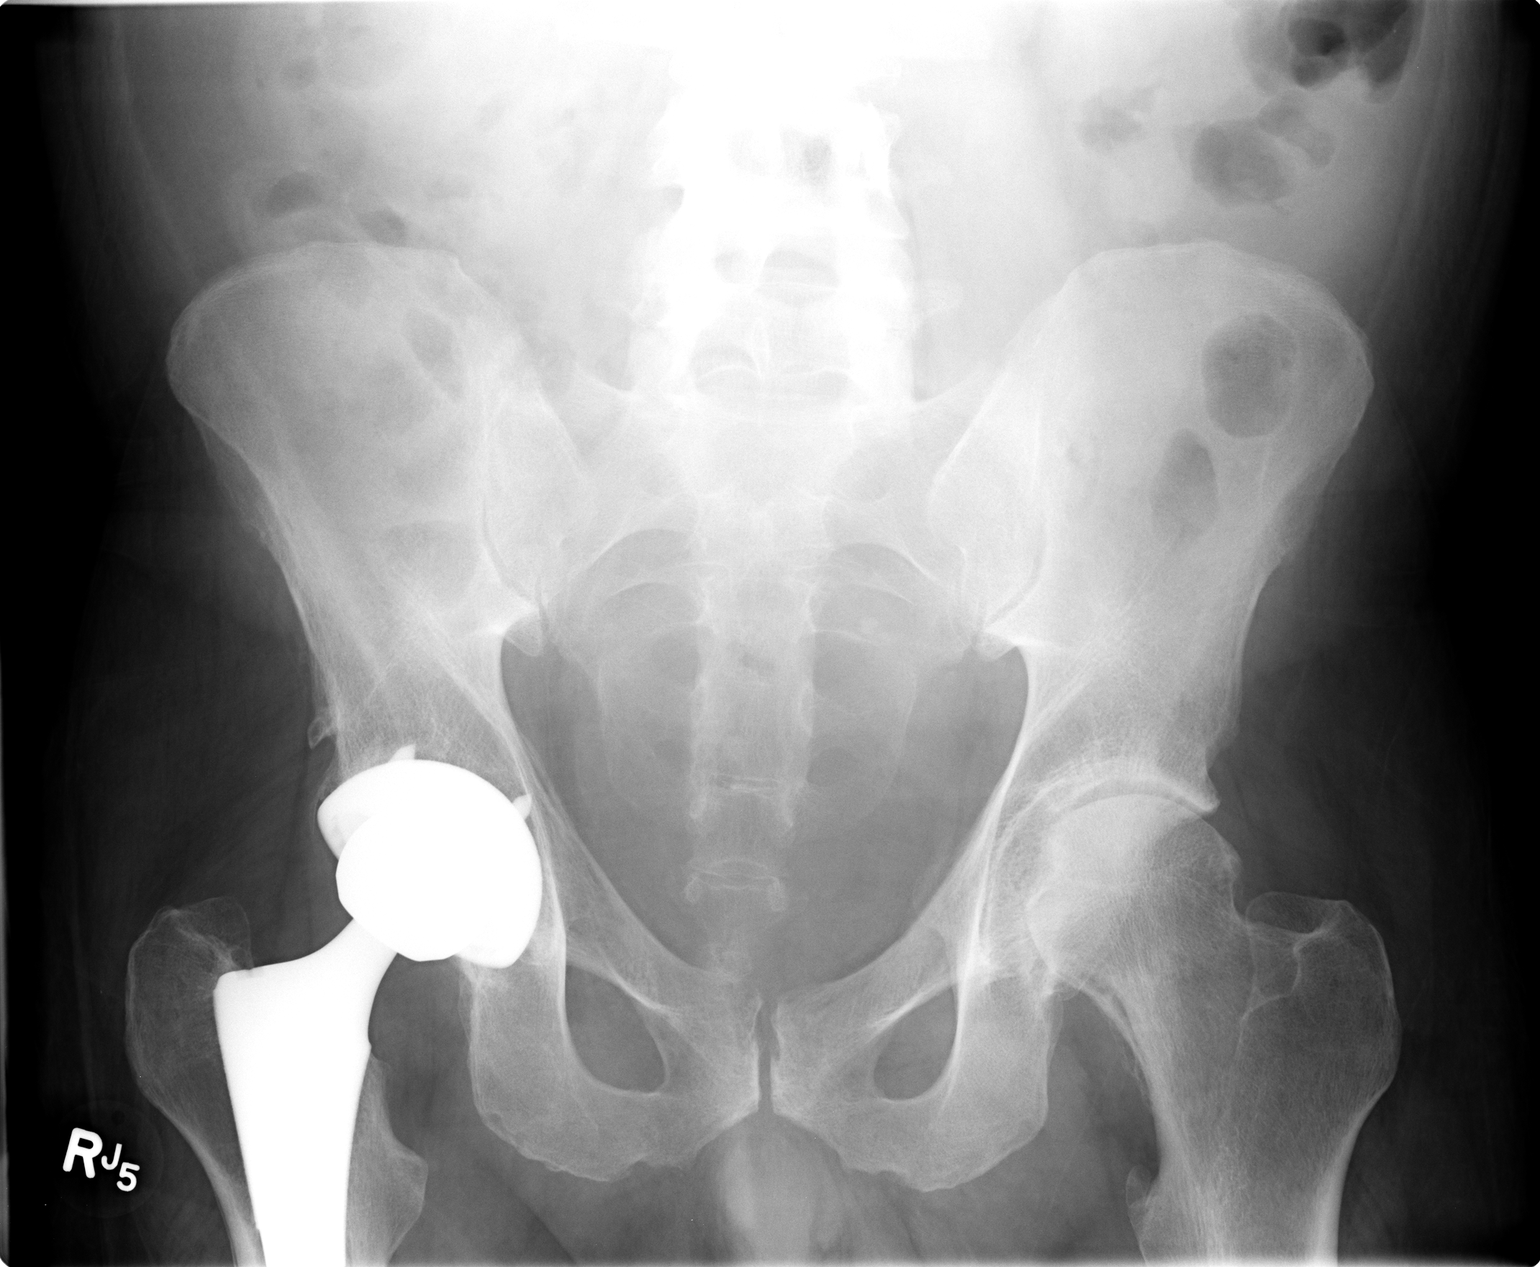

[view not recorded (2 of 3)]
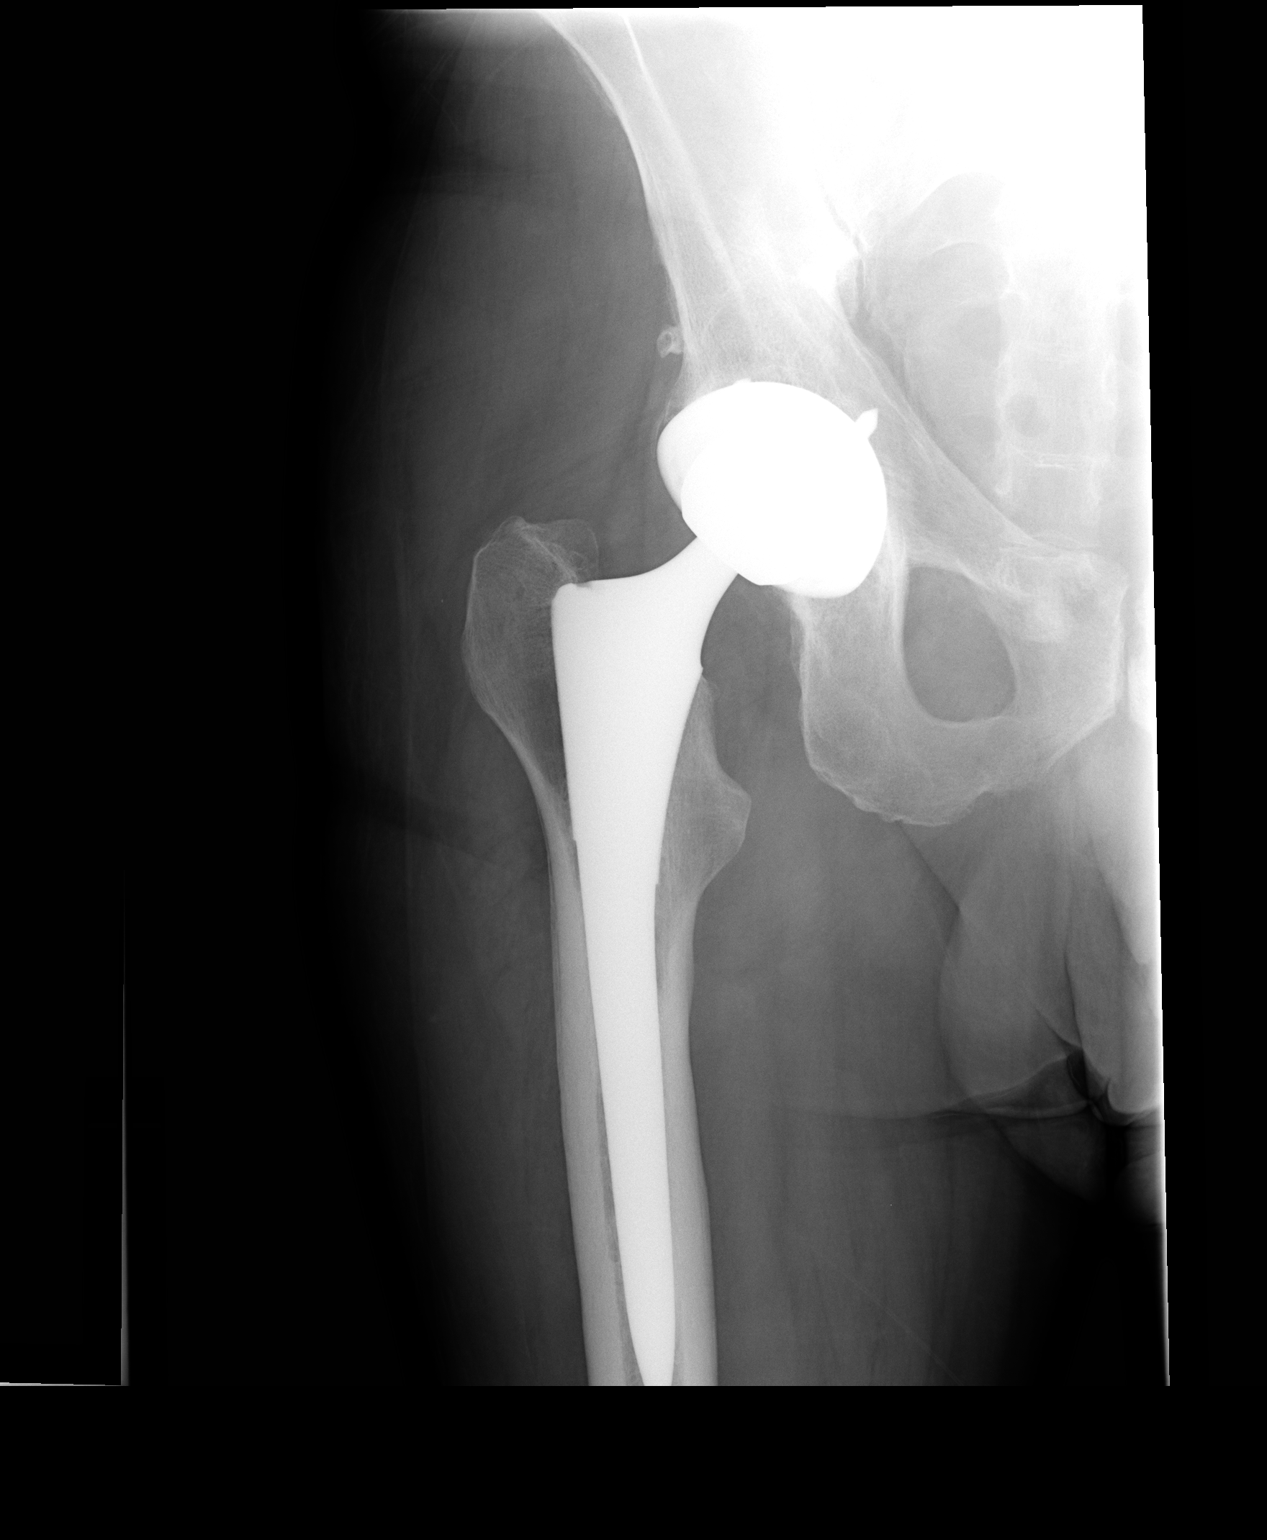

[view not recorded (3 of 3)]
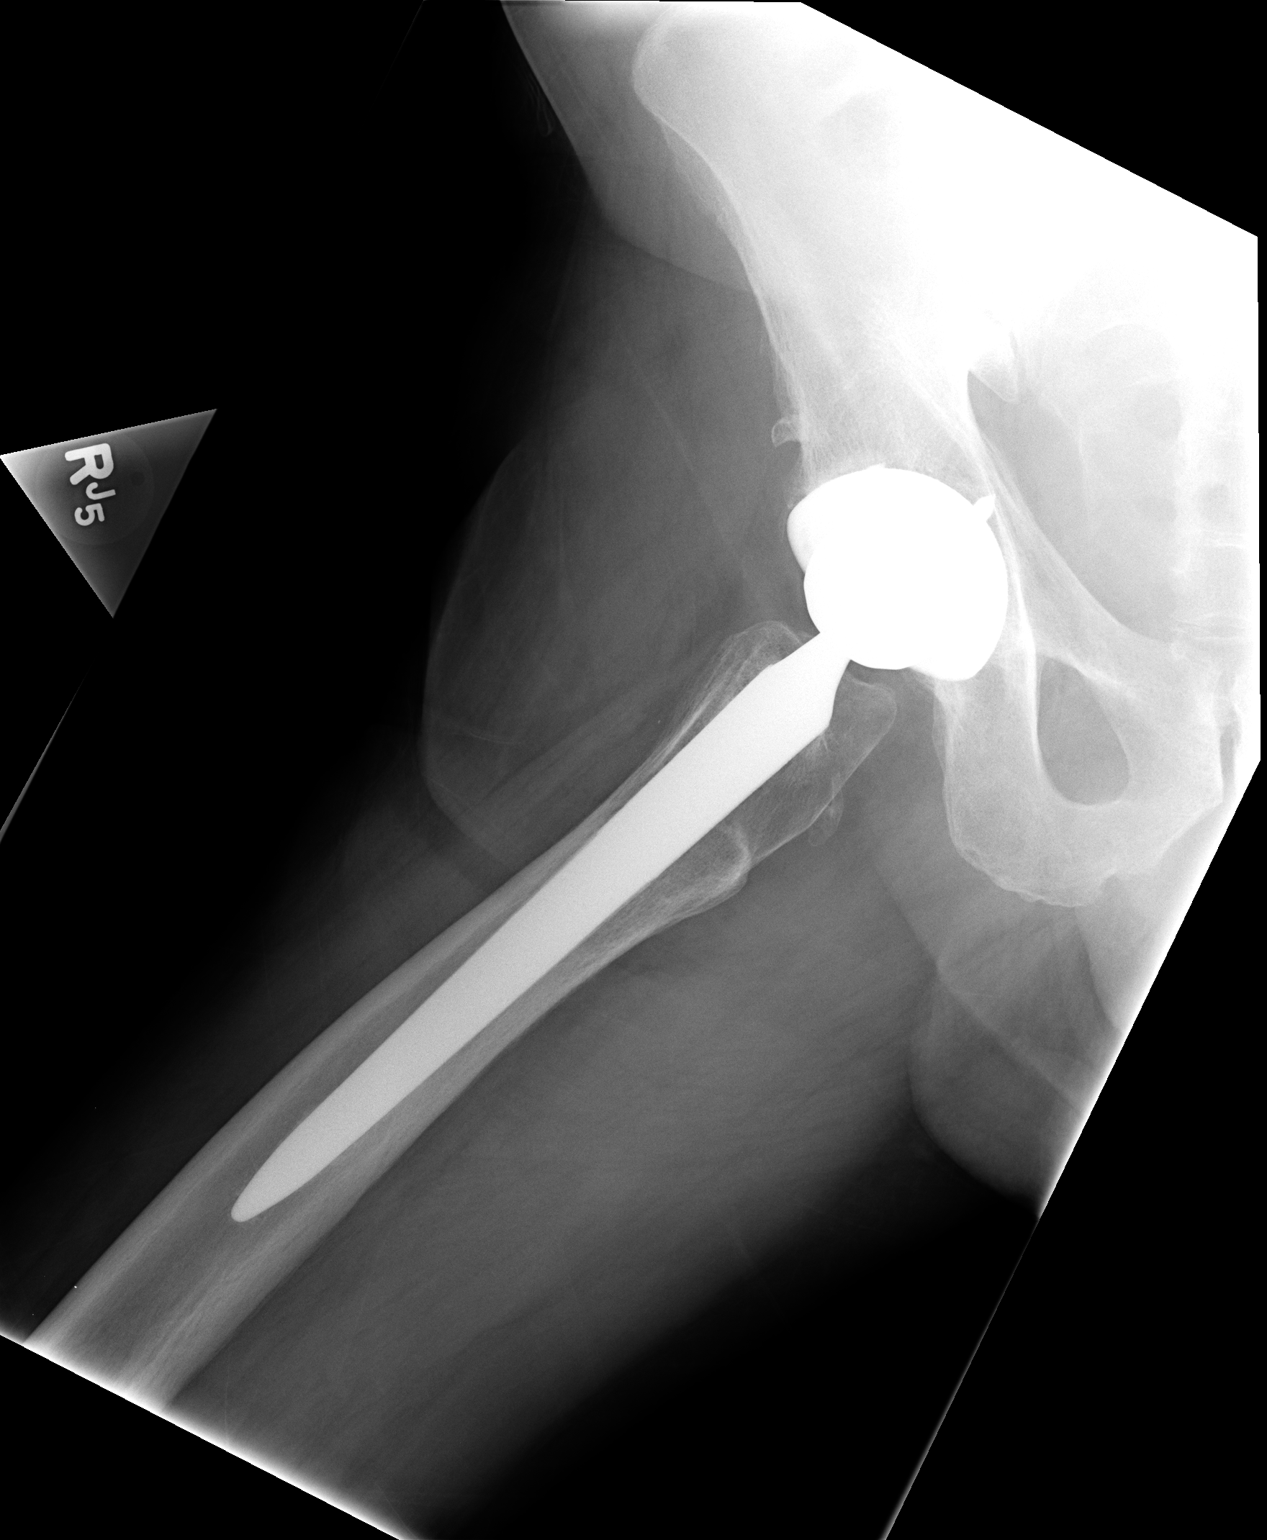

[3 of 3 positions shown; findings below may reference images not displayed]

FINDINGS: Right hip arthroplasty device appears intact.  There is
no evidence for fracture or subluxation.  Moderate to marked
changes of osteoarthritis affects the left hip.
IMPRESSION: 1.  No evidence for hip fracture or dislocation.
2.  The left hip osteoarthritis.

## 2014-11-15 IMAGING — CR DG KNEE COMPLETE 4+V*R*
4 series · 4 of 4 positions shown · non-contrast
Comparison: 02/08/2012

CLINICAL DATA: Fall.  Pain.

RIGHT KNEE - COMPLETE 4+ VIEW

[view not recorded (1 of 4)]
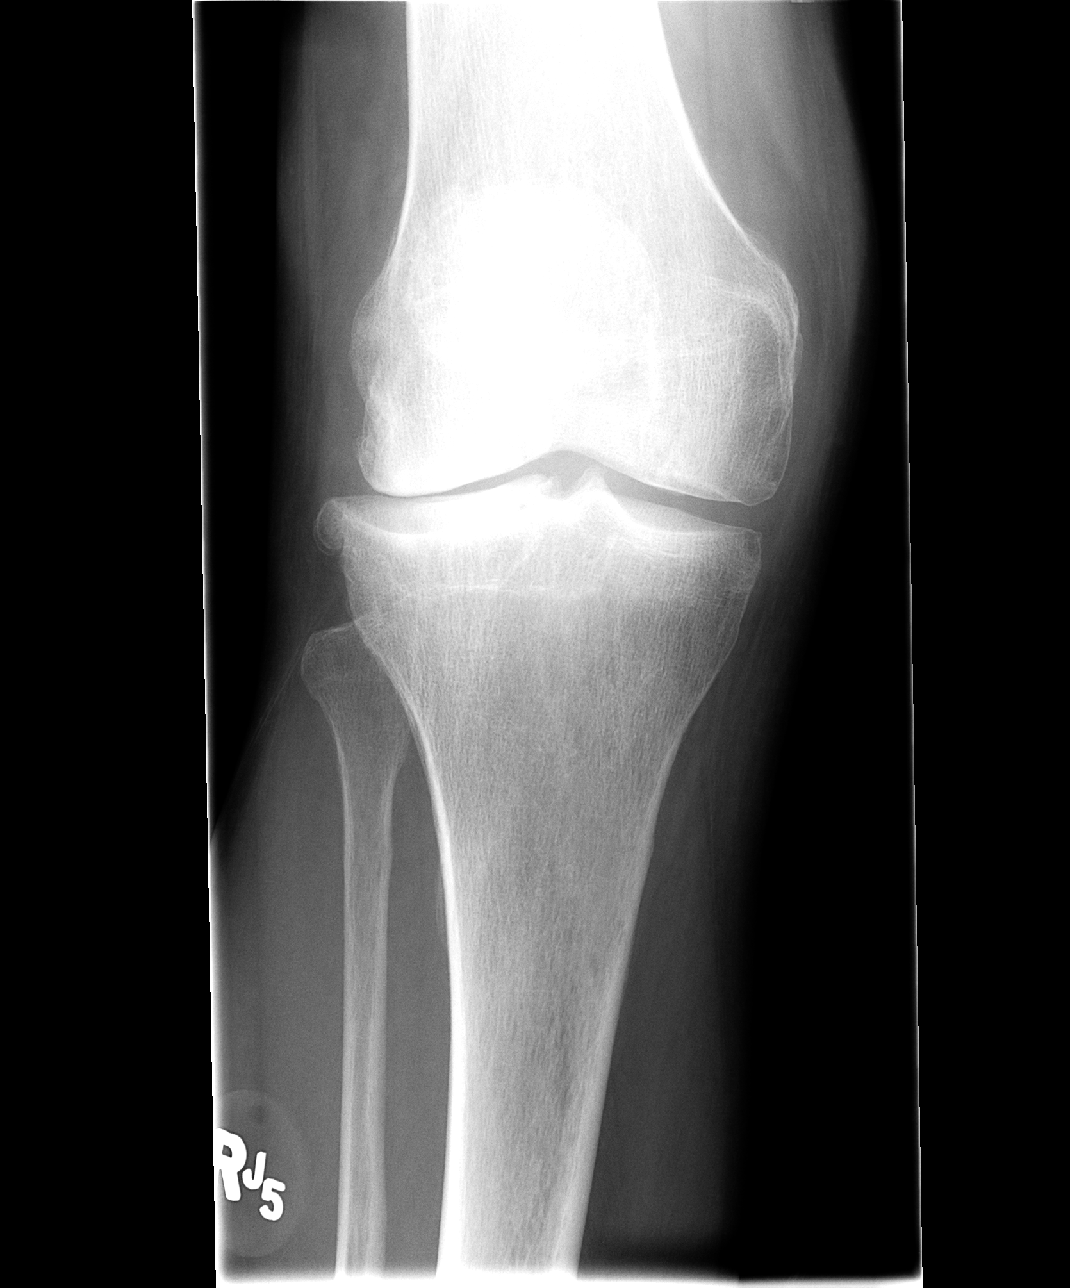

[view not recorded (2 of 4)]
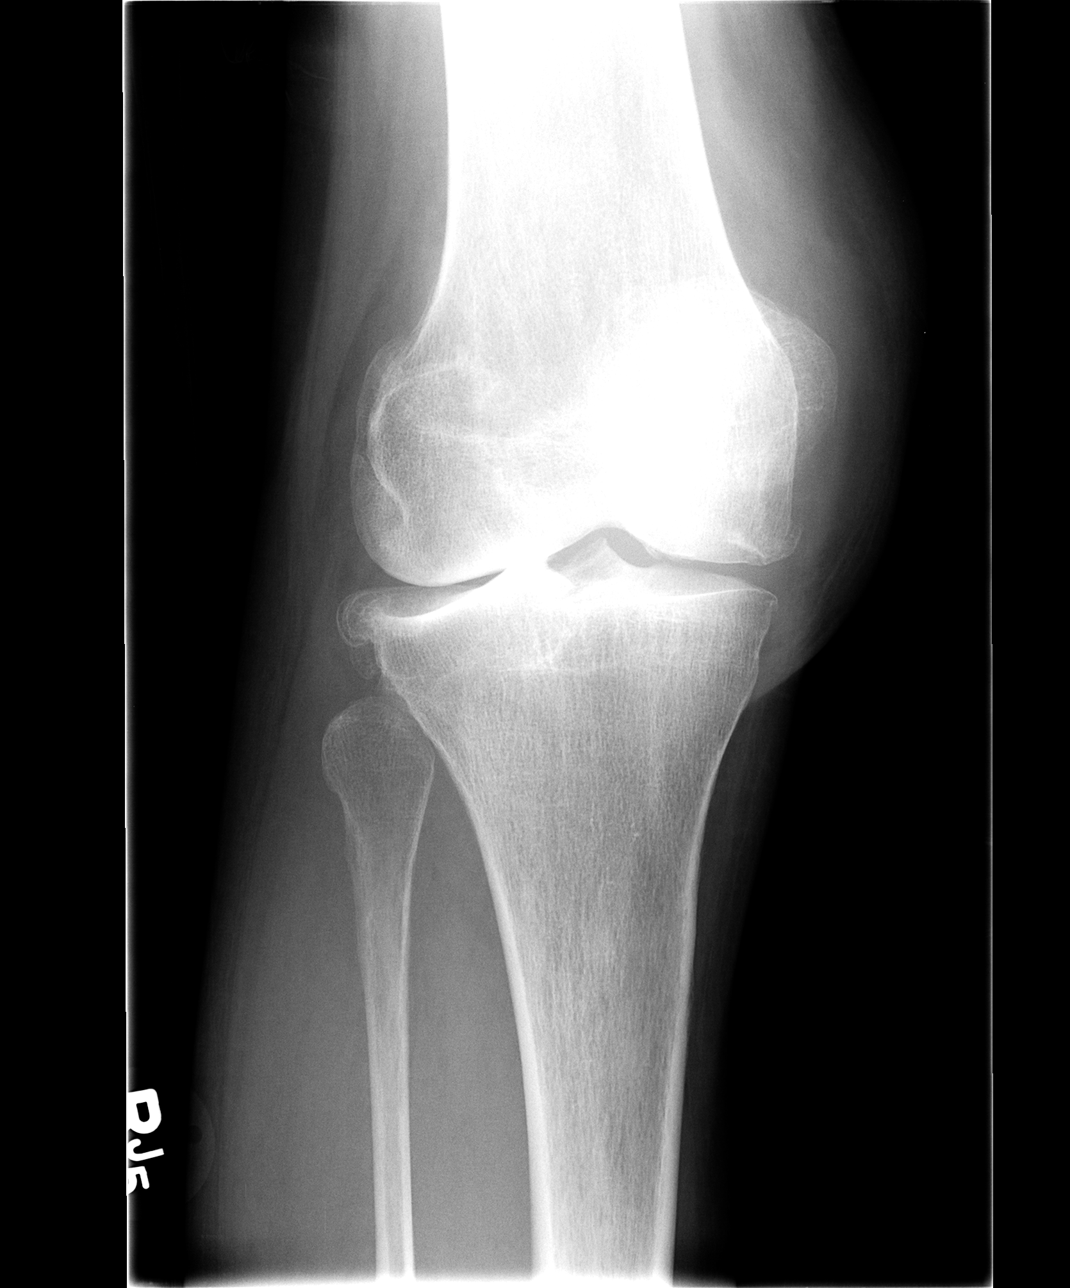

[view not recorded (3 of 4)]
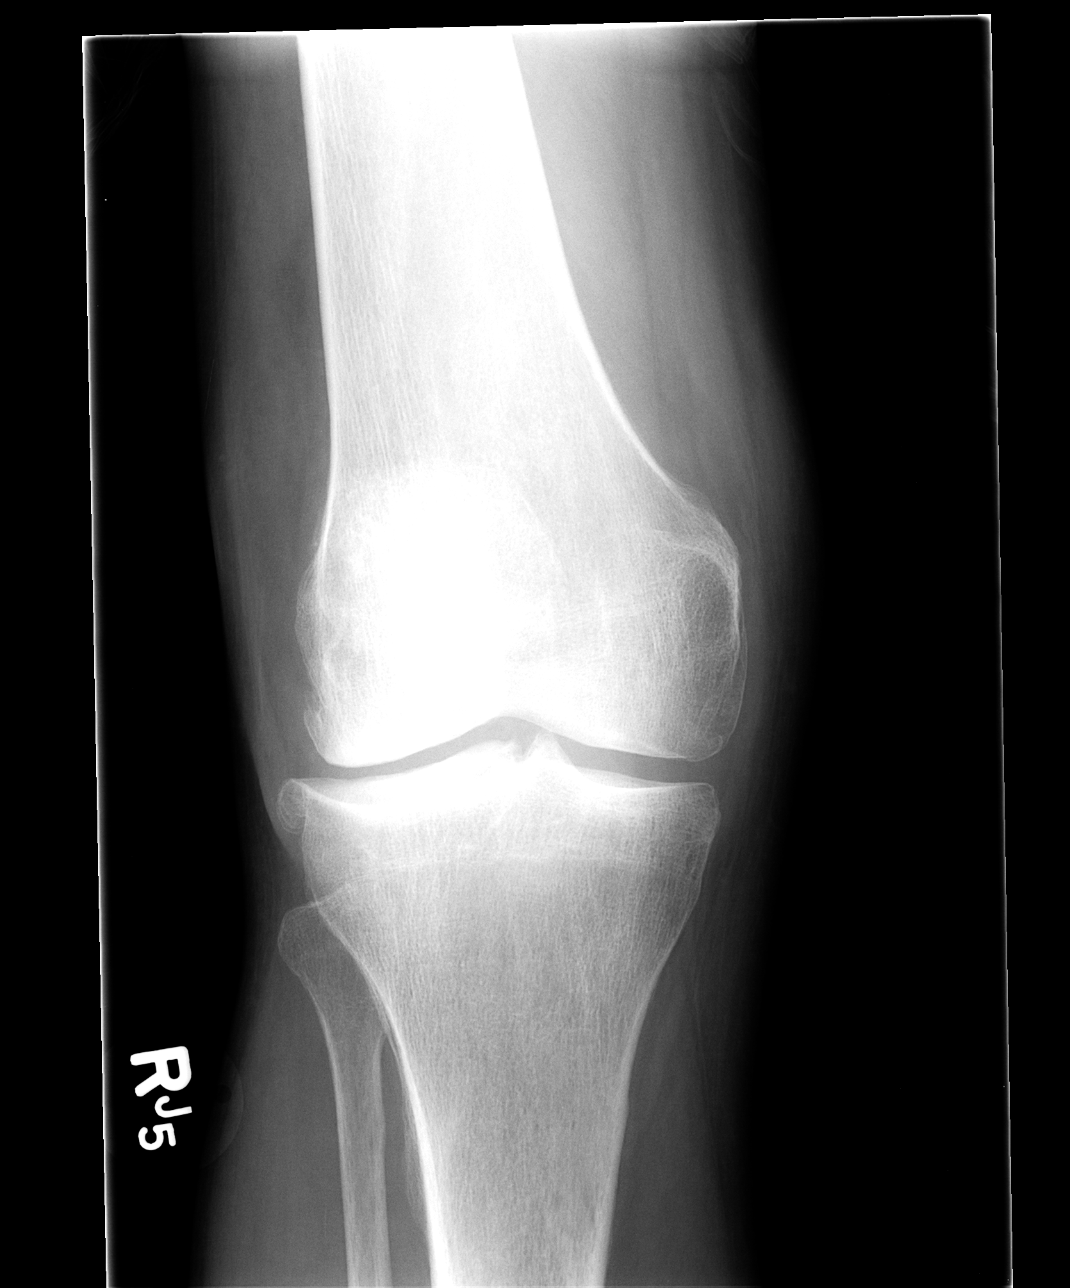

[view not recorded (4 of 4)]
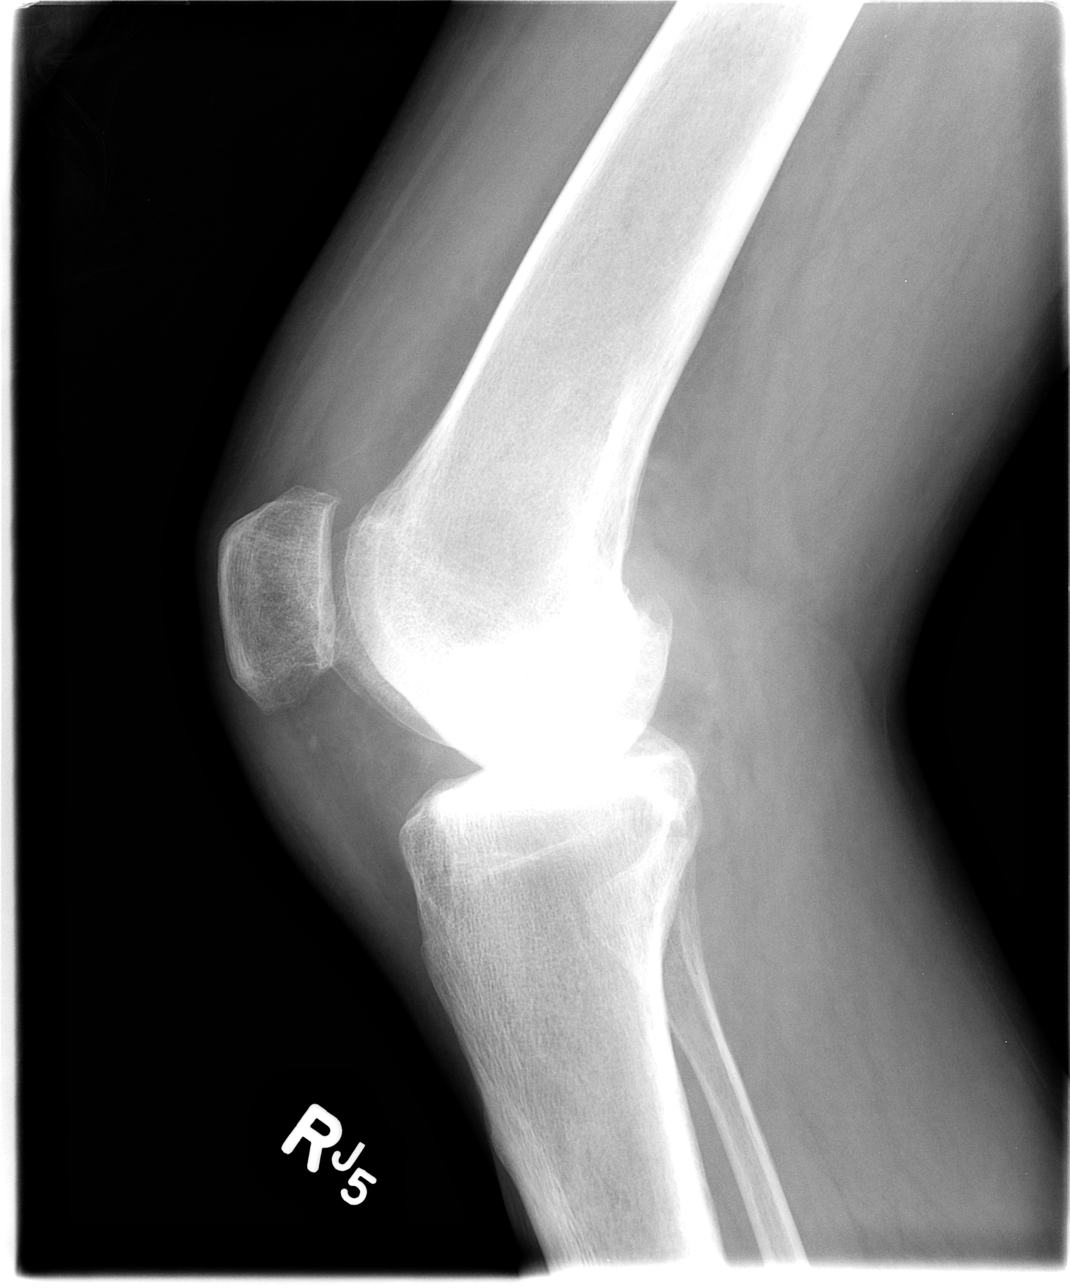

[4 of 4 positions shown; findings below may reference images not displayed]

FINDINGS: There is a moderate sized joint effusion.  There are
degenerative changes most pronounced in the lateral compartment.
No evidence of acute fracture.
IMPRESSION: Chronic degenerative changes.  Moderate sized effusion, larger
since 3 weeks ago.  No evidence of acute fracture.

## 2014-11-15 IMAGING — CR DG HAND COMPLETE 3+V*R*
3 series · 3 of 3 positions shown · non-contrast
Comparison: None.

CLINICAL DATA: Fell.  Pain.

RIGHT HAND - COMPLETE 3+ VIEW

[view not recorded (1 of 3)]
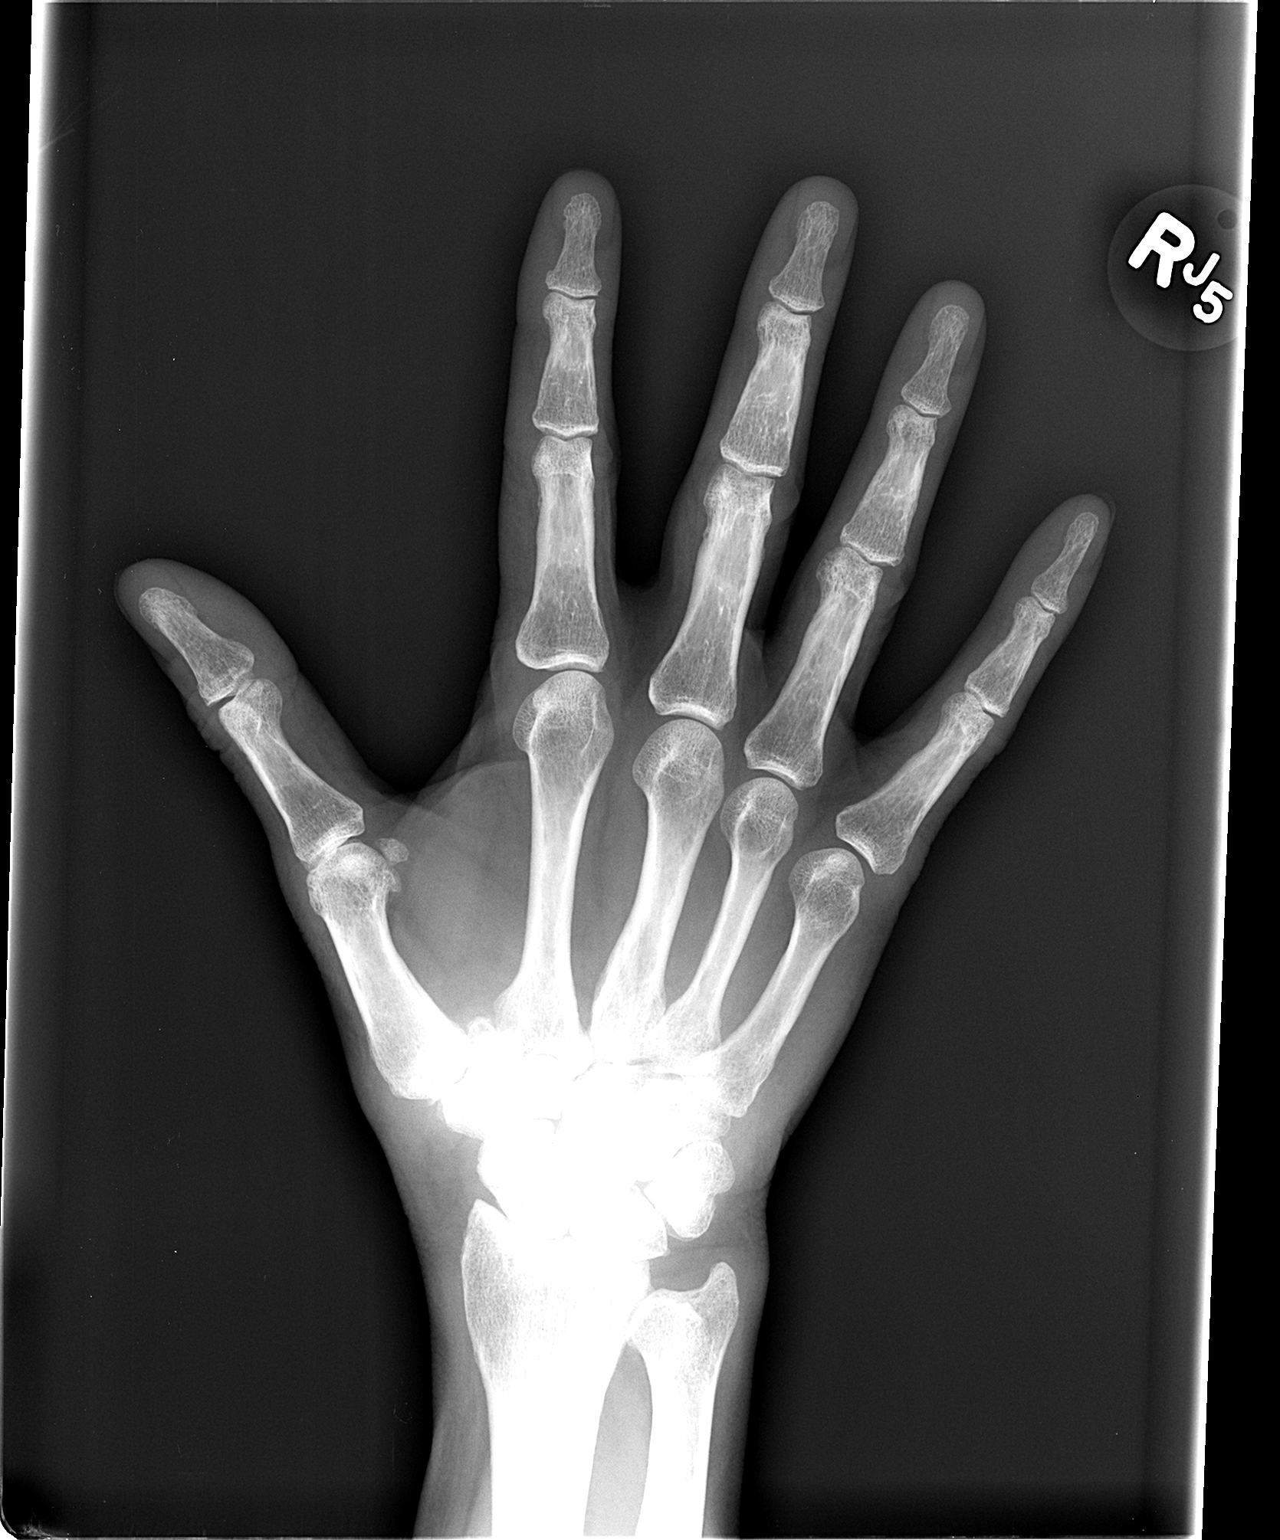

[view not recorded (2 of 3)]
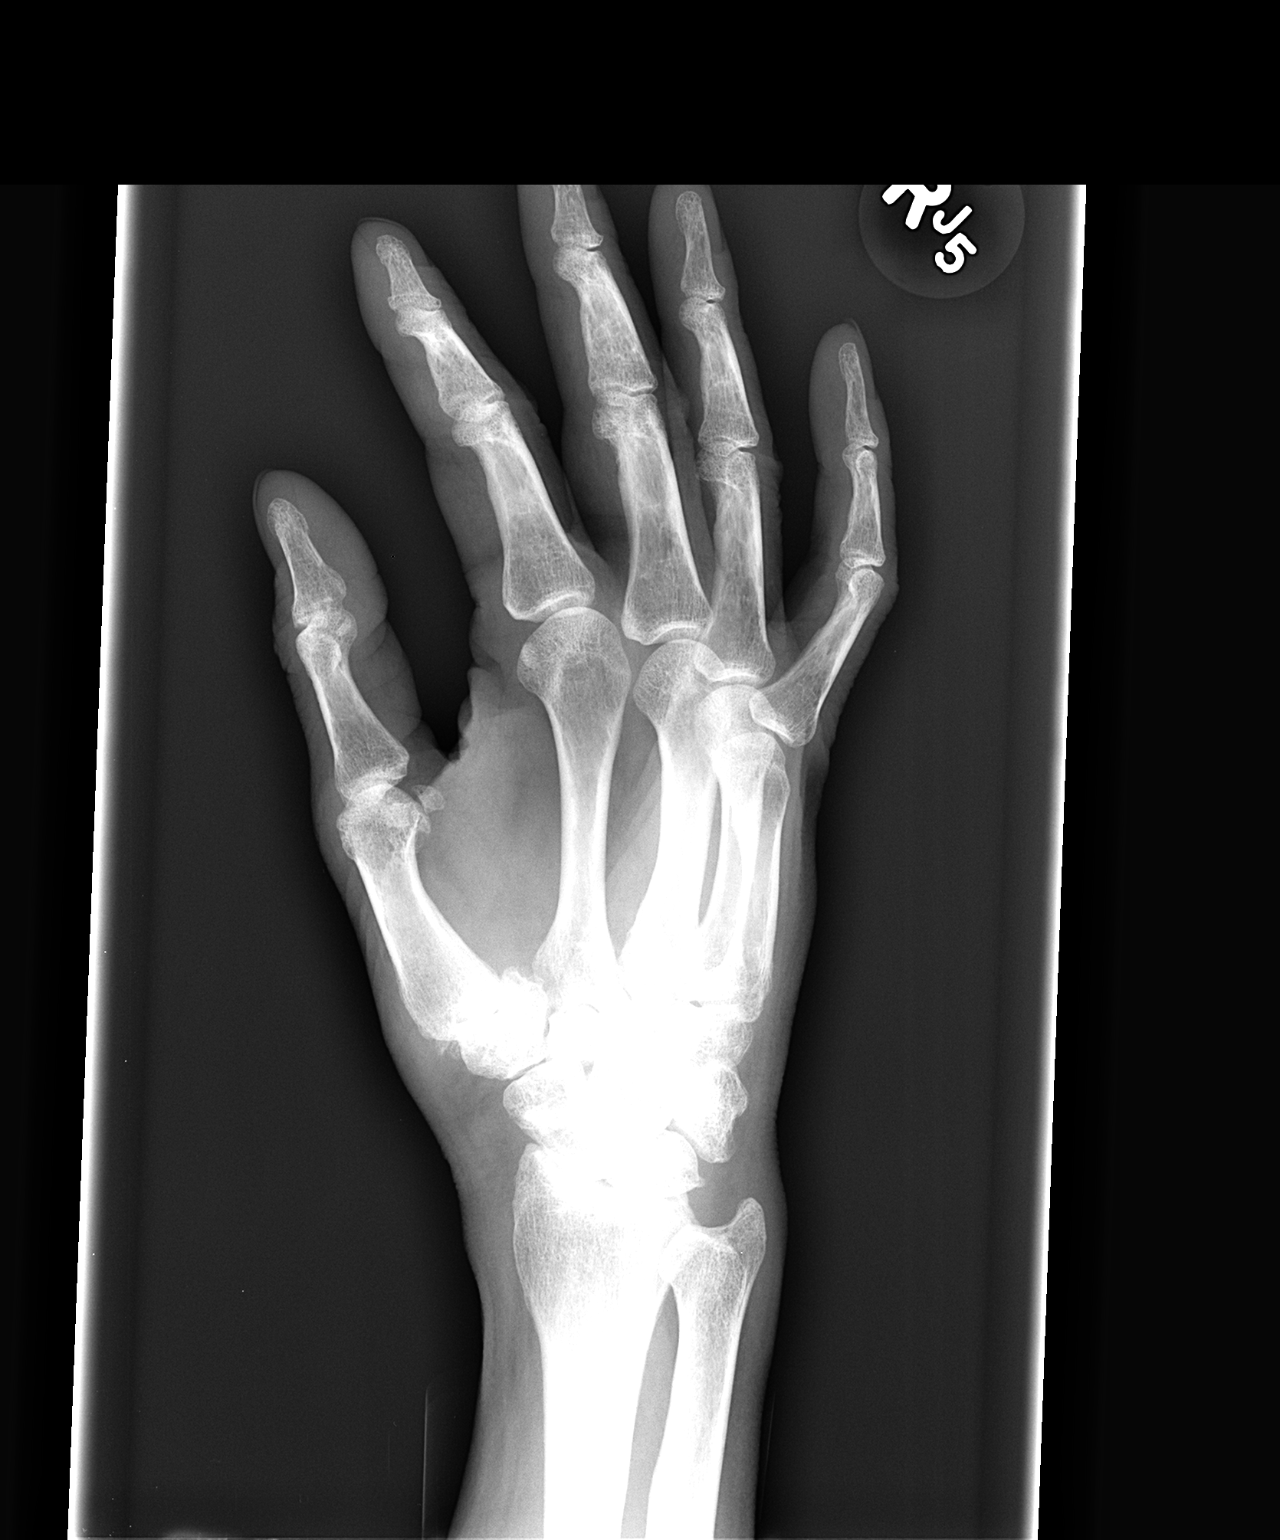

[view not recorded (3 of 3)]
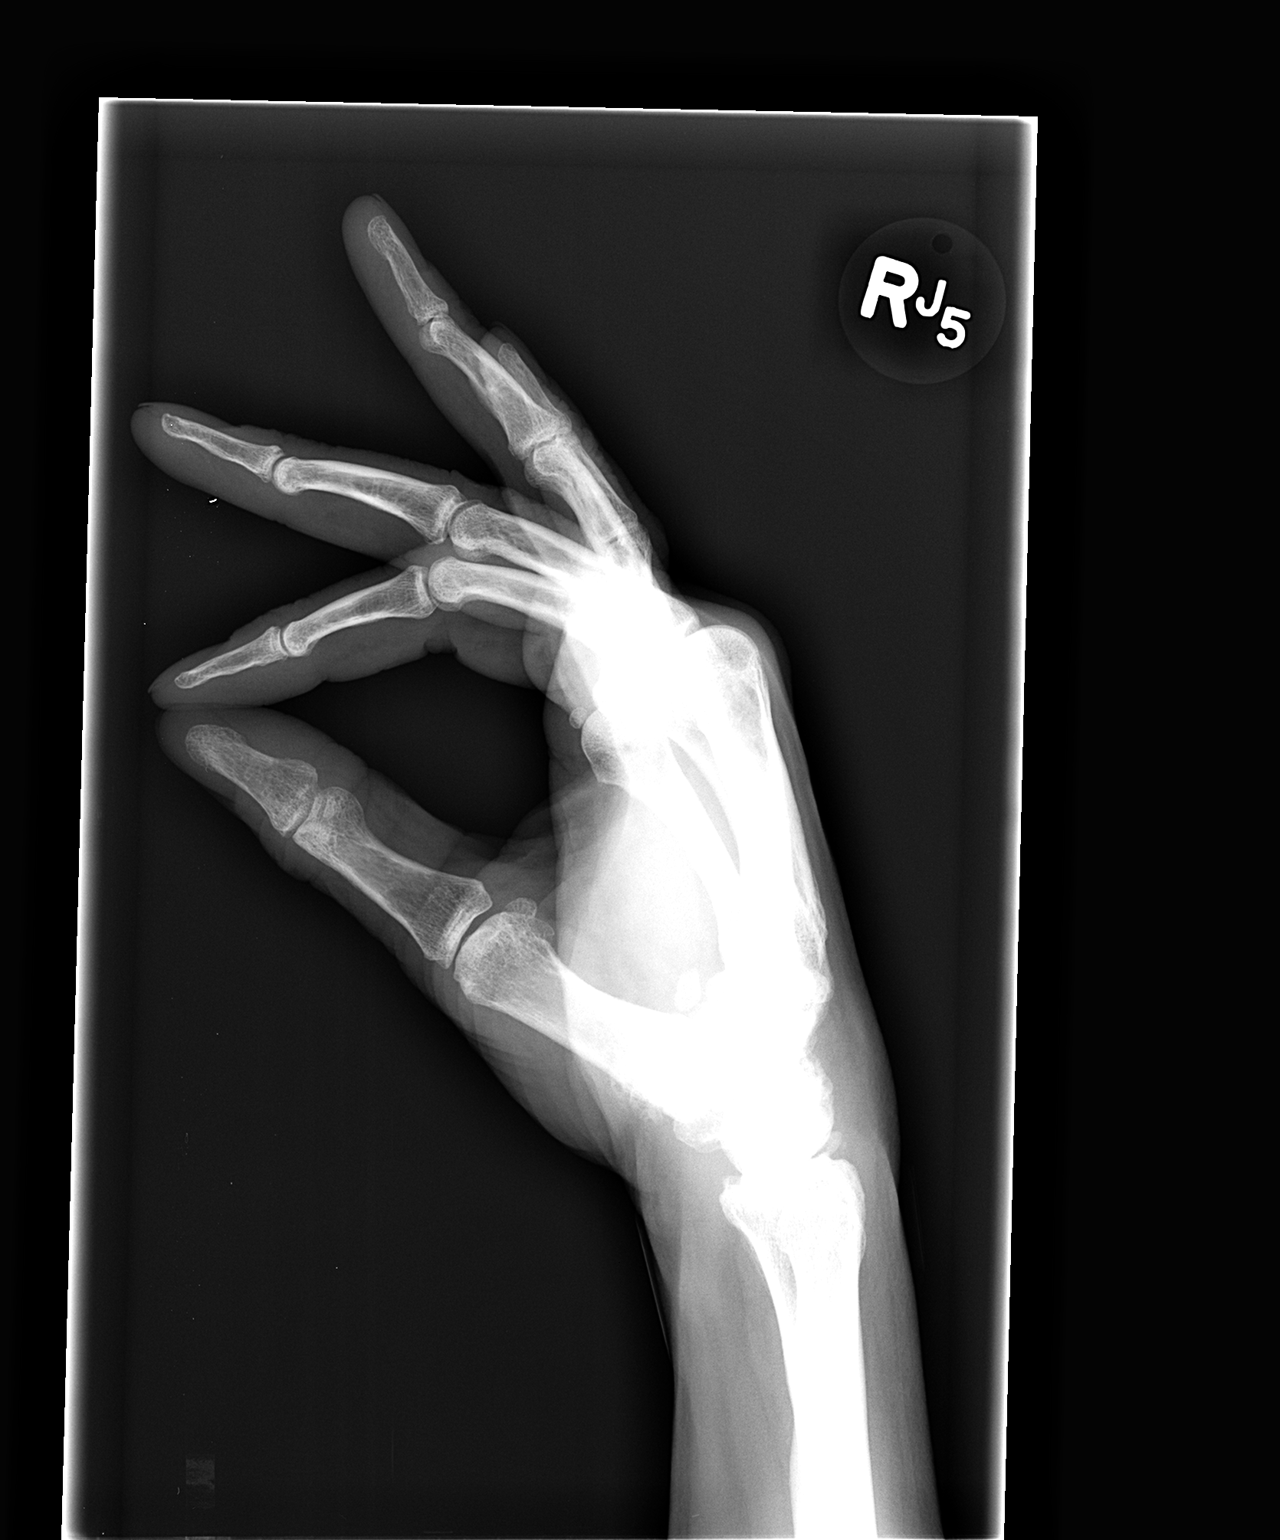

[3 of 3 positions shown; findings below may reference images not displayed]

FINDINGS: No evidence of fracture or dislocation.  There are
chronic degenerative changes at the first carpal metacarpal joint
and at the wrist.
IMPRESSION: No acute or traumatic finding.  Chronic generative changes of the
carpus and wrist.

## 2014-12-20 IMAGING — CR DG CHEST 2V
2 series · 2 of 2 positions shown · non-contrast
Comparison: Chest x-ray dated 02/08/2012

CLINICAL DATA: Preoperative respiratory exam.  Osteoarthritis of
the right knee.

CHEST - 2 VIEW

[view not recorded (1 of 2)]
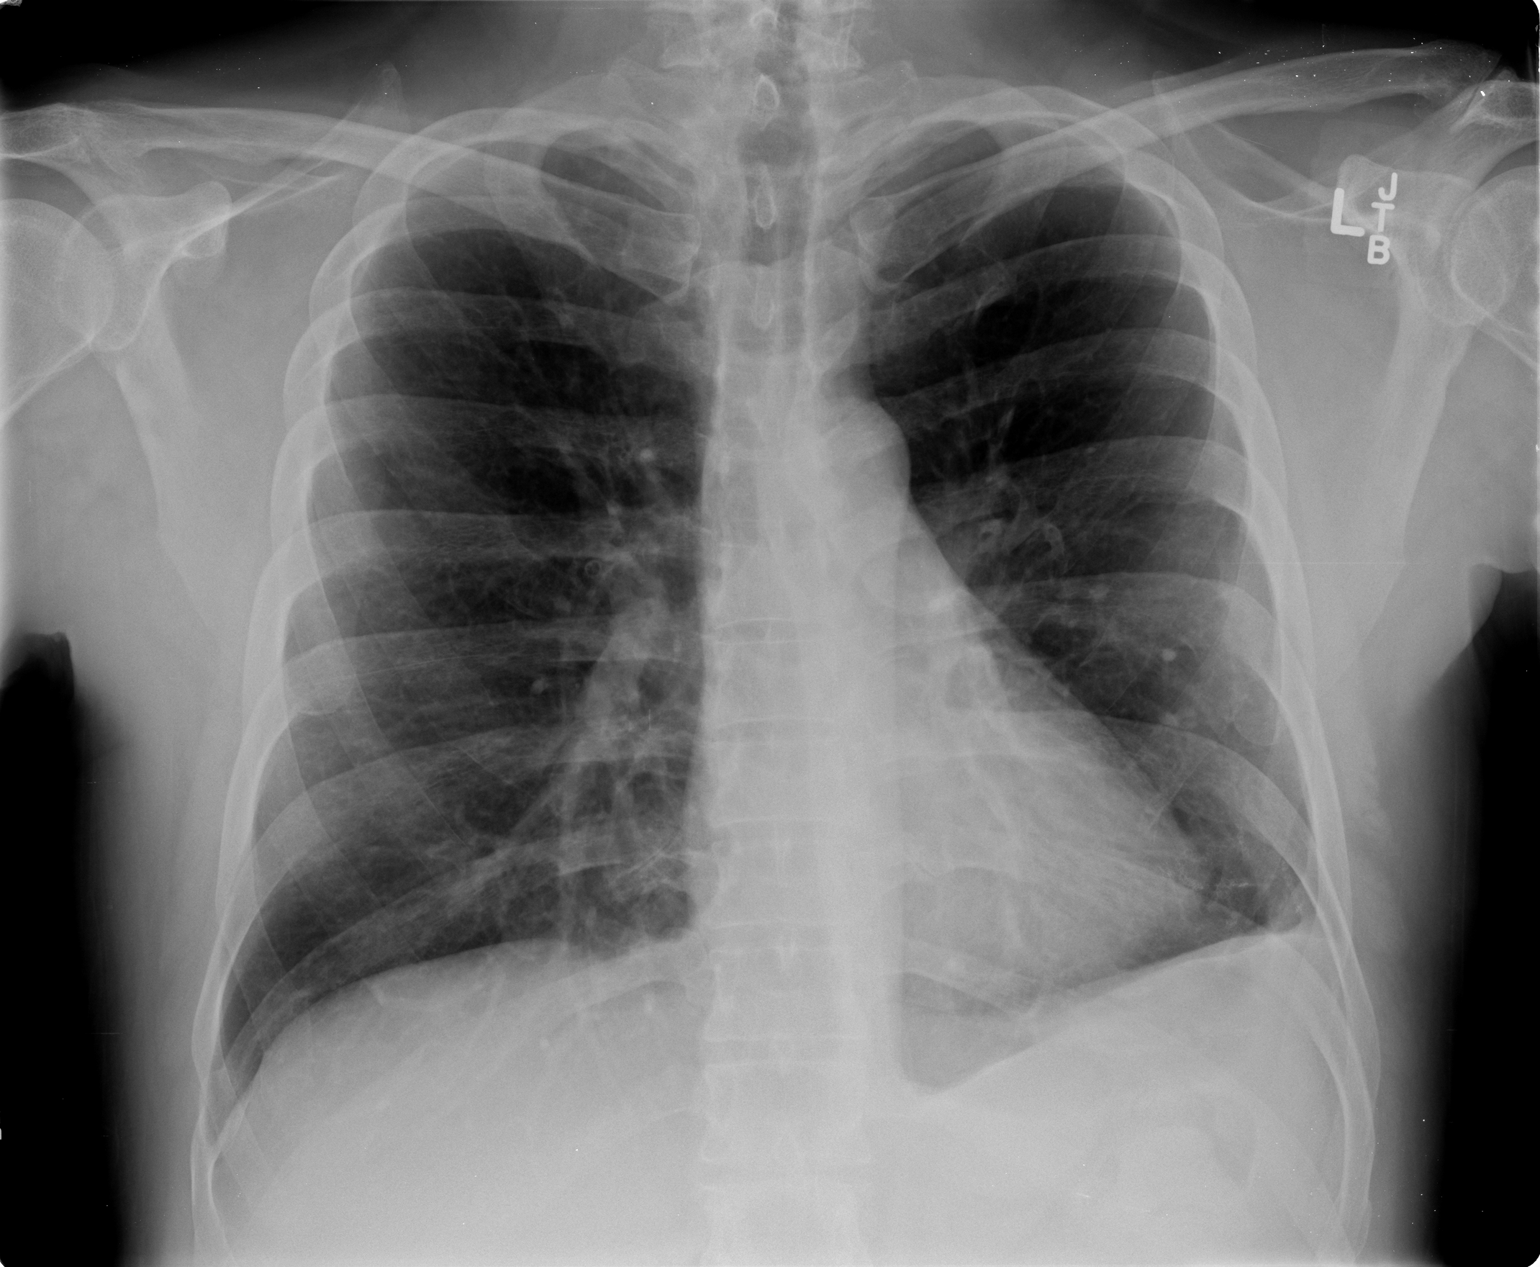

[view not recorded (2 of 2)]
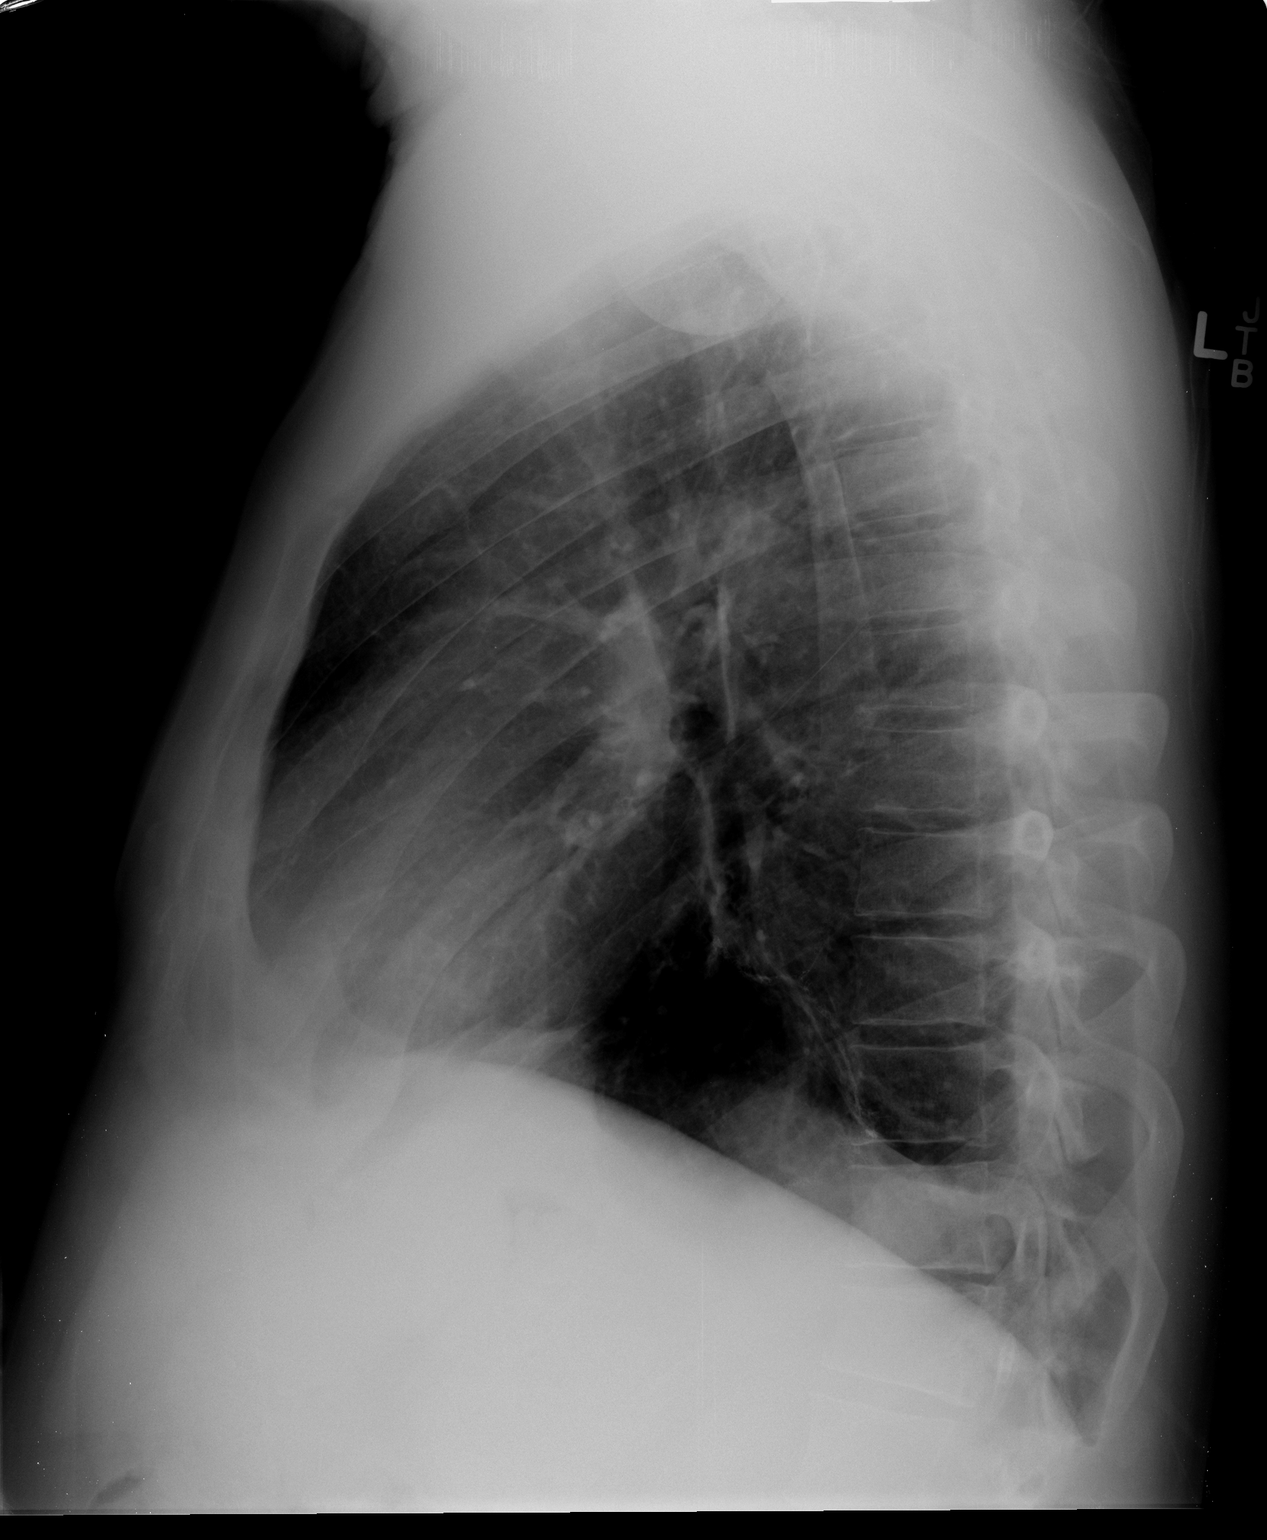

[2 of 2 positions shown; findings below may reference images not displayed]

FINDINGS: Heart size and pulmonary vascularity are normal.
Scarring at the left lung base is unchanged.  Lungs are otherwise
clear.  No acute osseous abnormality.  Old wedge deformity of the
superior aspect T12.
IMPRESSION: No acute abnormalities.

## 2015-01-03 IMAGING — CR DG KNEE 1-2V PORT*R*
2 series · 2 of 2 positions shown · non-contrast
Comparison: 03/02/2012

CLINICAL DATA: The knee replacement

PORTABLE RIGHT KNEE - 1-2 VIEW

[AP]
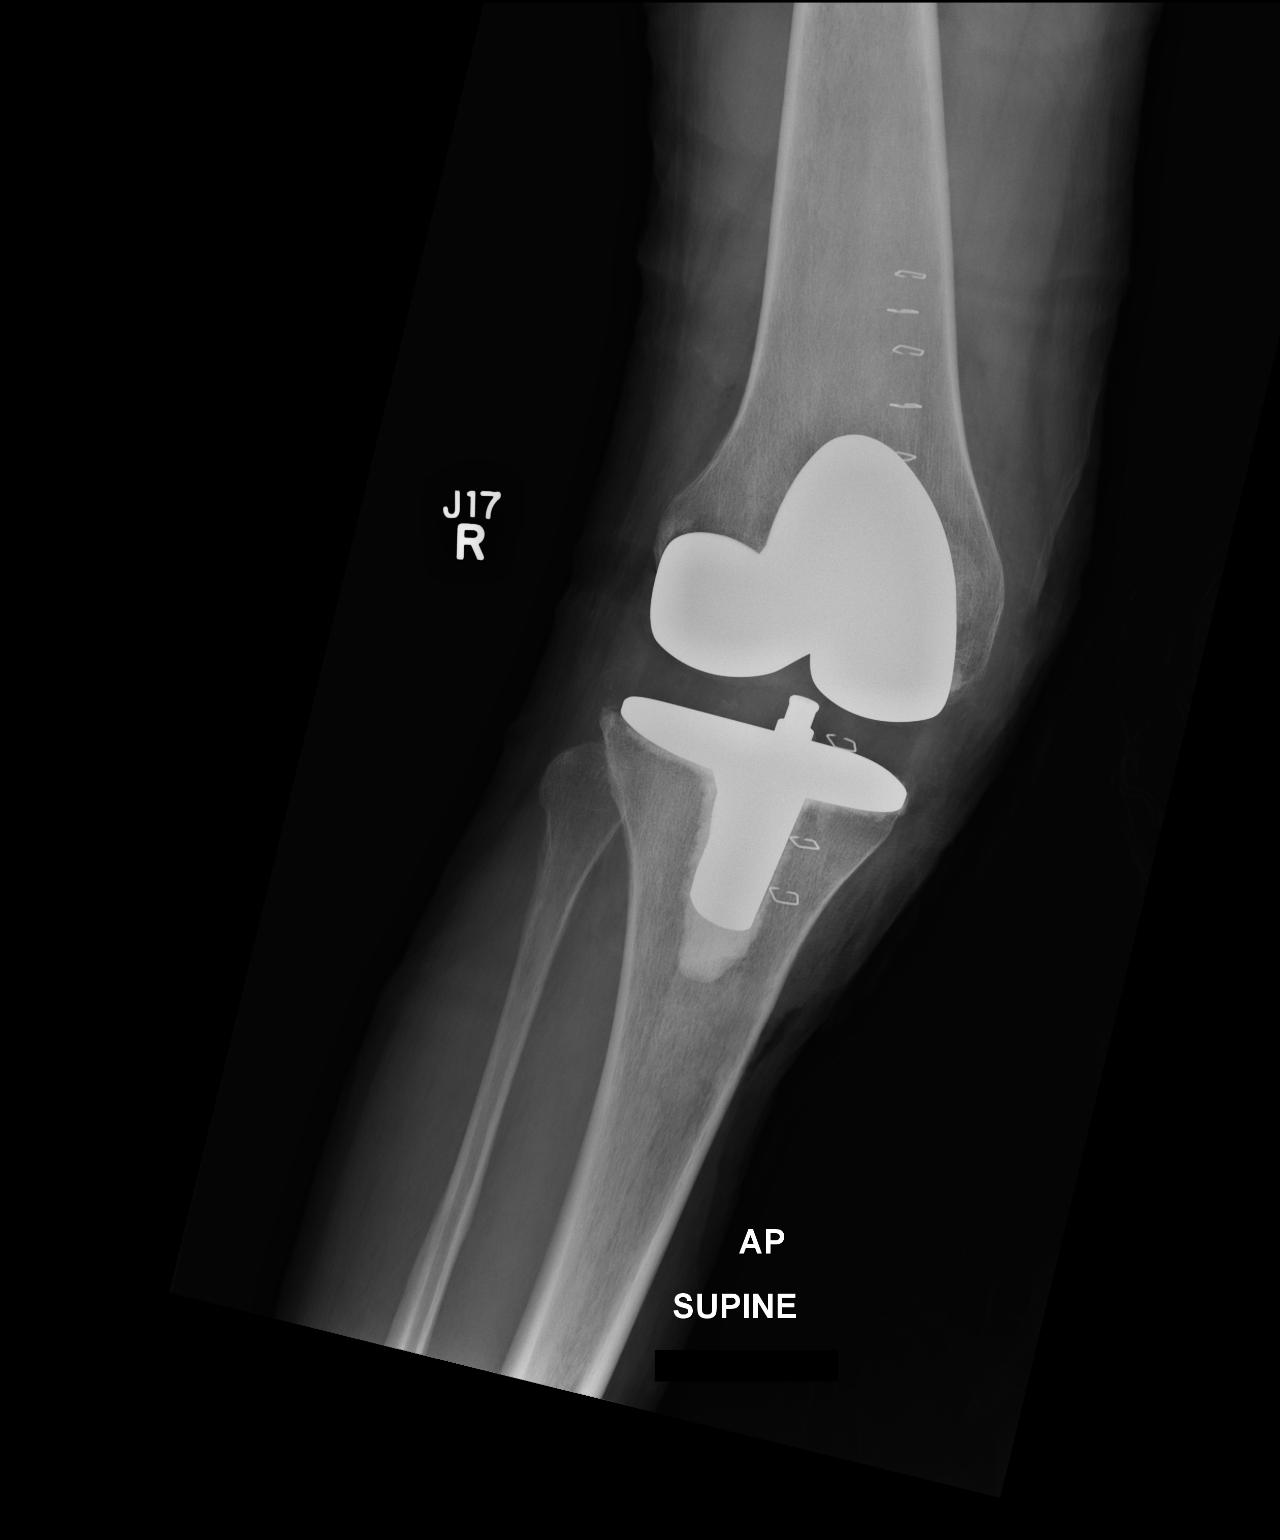

[xtable lateral]
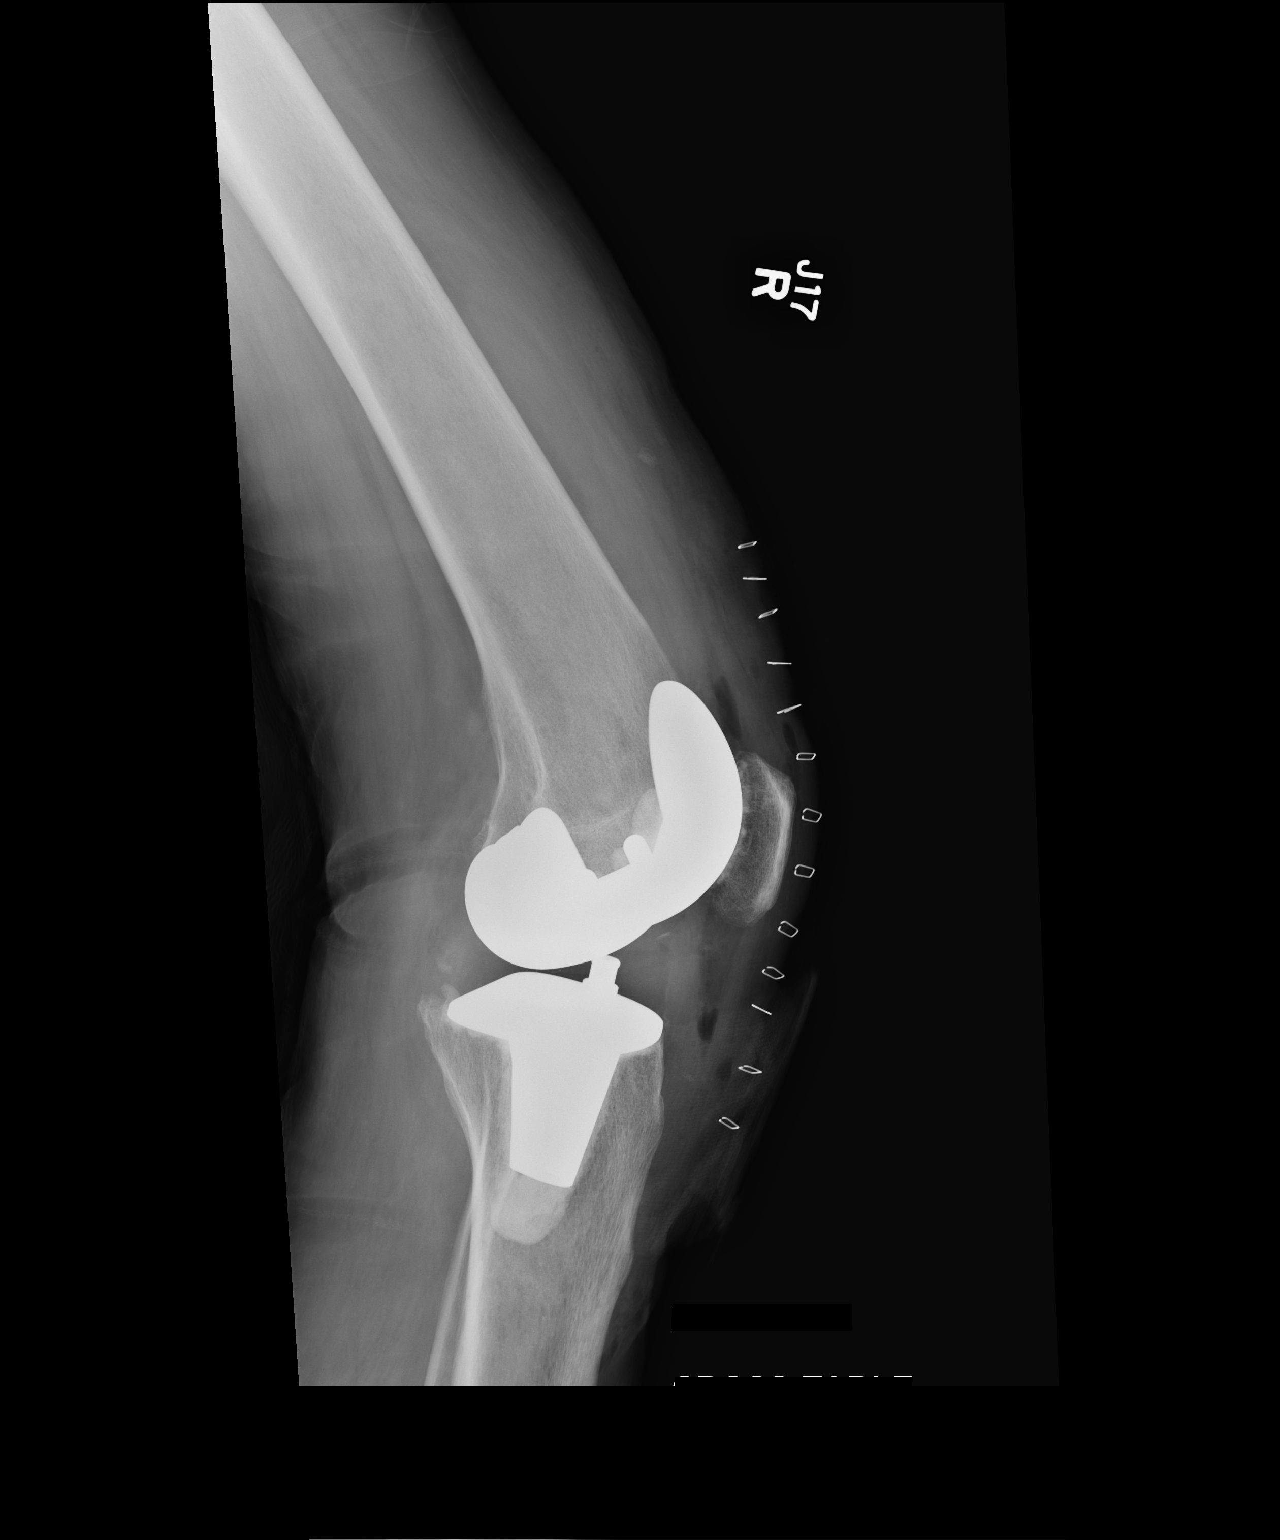

[2 of 2 positions shown; findings below may reference images not displayed]

FINDINGS: Two-view exam shows the patient to be status post
tricompartmental knee replacement.  No evidence for immediate
hardware complications.  Gas in the soft tissues is compatible with
immediate postoperative state.  Skin staples are noted anteriorly.
IMPRESSION: Status post total knee replacement.  No evidence for hardware
complications.

## 2015-02-15 ENCOUNTER — Emergency Department (HOSPITAL_COMMUNITY): Payer: Medicare Other

## 2015-02-15 ENCOUNTER — Encounter (HOSPITAL_COMMUNITY): Payer: Self-pay

## 2015-02-15 ENCOUNTER — Emergency Department (HOSPITAL_COMMUNITY)
Admission: EM | Admit: 2015-02-15 | Discharge: 2015-02-15 | Disposition: A | Discharge status: Home / Self Care | Payer: Medicare Other | Attending: Emergency Medicine | Admitting: Emergency Medicine

## 2015-02-15 DIAGNOSIS — R55 Syncope and collapse: Secondary | ICD-10-CM | POA: Diagnosis not present

## 2015-02-15 DIAGNOSIS — Z9119 Patient's noncompliance with other medical treatment and regimen: Secondary | ICD-10-CM | POA: Diagnosis not present

## 2015-02-15 DIAGNOSIS — R109 Unspecified abdominal pain: Secondary | ICD-10-CM | POA: Diagnosis not present

## 2015-02-15 DIAGNOSIS — Z7984 Long term (current) use of oral hypoglycemic drugs: Secondary | ICD-10-CM | POA: Insufficient documentation

## 2015-02-15 DIAGNOSIS — Z79899 Other long term (current) drug therapy: Secondary | ICD-10-CM | POA: Diagnosis not present

## 2015-02-15 DIAGNOSIS — F1310 Sedative, hypnotic or anxiolytic abuse, uncomplicated: Secondary | ICD-10-CM | POA: Insufficient documentation

## 2015-02-15 DIAGNOSIS — G8929 Other chronic pain: Secondary | ICD-10-CM | POA: Diagnosis not present

## 2015-02-15 DIAGNOSIS — Z8673 Personal history of transient ischemic attack (TIA), and cerebral infarction without residual deficits: Secondary | ICD-10-CM | POA: Diagnosis not present

## 2015-02-15 DIAGNOSIS — F419 Anxiety disorder, unspecified: Secondary | ICD-10-CM | POA: Insufficient documentation

## 2015-02-15 DIAGNOSIS — Z8701 Personal history of pneumonia (recurrent): Secondary | ICD-10-CM | POA: Insufficient documentation

## 2015-02-15 DIAGNOSIS — Z8719 Personal history of other diseases of the digestive system: Secondary | ICD-10-CM | POA: Diagnosis not present

## 2015-02-15 DIAGNOSIS — M1990 Unspecified osteoarthritis, unspecified site: Secondary | ICD-10-CM | POA: Insufficient documentation

## 2015-02-15 DIAGNOSIS — F1410 Cocaine abuse, uncomplicated: Secondary | ICD-10-CM | POA: Insufficient documentation

## 2015-02-15 DIAGNOSIS — G629 Polyneuropathy, unspecified: Secondary | ICD-10-CM | POA: Diagnosis not present

## 2015-02-15 DIAGNOSIS — F17210 Nicotine dependence, cigarettes, uncomplicated: Secondary | ICD-10-CM | POA: Insufficient documentation

## 2015-02-15 DIAGNOSIS — M545 Low back pain: Secondary | ICD-10-CM | POA: Diagnosis not present

## 2015-02-15 DIAGNOSIS — L97509 Non-pressure chronic ulcer of other part of unspecified foot with unspecified severity: Secondary | ICD-10-CM | POA: Insufficient documentation

## 2015-02-15 DIAGNOSIS — E11621 Type 2 diabetes mellitus with foot ulcer: Secondary | ICD-10-CM | POA: Diagnosis not present

## 2015-02-15 DIAGNOSIS — F329 Major depressive disorder, single episode, unspecified: Secondary | ICD-10-CM | POA: Insufficient documentation

## 2015-02-15 HISTORY — DX: Other psychoactive substance abuse, uncomplicated: F19.10

## 2015-02-15 HISTORY — DX: Patient's noncompliance with other medical treatment and regimen: Z91.19

## 2015-02-15 HISTORY — DX: Other chronic pain: G89.29

## 2015-02-15 HISTORY — DX: Patient's noncompliance with other medical treatment and regimen due to unspecified reason: Z91.199

## 2015-02-15 LAB — COMPREHENSIVE METABOLIC PANEL
ALBUMIN: 4 g/dL (ref 3.5–5.0)
ALK PHOS: 74 U/L (ref 38–126)
ALT: 20 U/L (ref 17–63)
ANION GAP: 8 (ref 5–15)
AST: 22 U/L (ref 15–41)
BUN: 16 mg/dL (ref 6–20)
CALCIUM: 9.2 mg/dL (ref 8.9–10.3)
CO2: 27 mmol/L (ref 22–32)
Chloride: 107 mmol/L (ref 101–111)
Creatinine, Ser: 1.04 mg/dL (ref 0.61–1.24)
GFR calc Af Amer: >60 mL/min (ref >60)
GFR calc non Af Amer: >60 mL/min (ref >60)
GLUCOSE: 112 mg/dL — AB (ref 65–99)
Potassium: 3.9 mmol/L (ref 3.5–5.1)
SODIUM: 142 mmol/L (ref 135–145)
Total Bilirubin: 0.5 mg/dL (ref 0.3–1.2)
Total Protein: 6.8 g/dL (ref 6.5–8.1)

## 2015-02-15 LAB — RAPID URINE DRUG SCREEN, HOSP PERFORMED
AMPHETAMINES: NOT DETECTED
BARBITURATES: NOT DETECTED
BENZODIAZEPINES: POSITIVE — AB
COCAINE: POSITIVE — AB
Opiates: NOT DETECTED
Tetrahydrocannabinol: NOT DETECTED

## 2015-02-15 LAB — SALICYLATE LEVEL: Salicylate Lvl: <4 mg/dL (ref 2.8–30.0)

## 2015-02-15 LAB — URINALYSIS, ROUTINE W REFLEX MICROSCOPIC
Bilirubin Urine: NEGATIVE
Glucose, UA: 250 mg/dL — AB
Hgb urine dipstick: NEGATIVE
KETONES UR: NEGATIVE mg/dL
LEUKOCYTES UA: NEGATIVE
NITRITE: NEGATIVE
Protein, ur: NEGATIVE mg/dL
pH: 6 (ref 5.0–8.0)

## 2015-02-15 LAB — CBC WITH DIFFERENTIAL/PLATELET
BASOS ABS: 0 10*3/uL (ref 0.0–0.1)
BASOS PCT: 0 %
EOS ABS: 0.3 10*3/uL (ref 0.0–0.7)
Eosinophils Relative: 3 %
HCT: 43.3 % (ref 39.0–52.0)
HEMOGLOBIN: 14.2 g/dL (ref 13.0–17.0)
LYMPHS ABS: 3.1 10*3/uL (ref 0.7–4.0)
Lymphocytes Relative: 34 %
MCH: 27.9 pg (ref 26.0–34.0)
MCHC: 32.8 g/dL (ref 30.0–36.0)
MCV: 85.1 fL (ref 78.0–100.0)
Monocytes Absolute: 0.5 10*3/uL (ref 0.1–1.0)
Monocytes Relative: 5 %
NEUTROS PCT: 58 %
Neutro Abs: 5.2 10*3/uL (ref 1.7–7.7)
Platelets: 146 10*3/uL — ABNORMAL LOW (ref 150–400)
RBC: 5.09 MIL/uL (ref 4.22–5.81)
RDW: 15 % (ref 11.5–15.5)
WBC: 9.1 10*3/uL (ref 4.0–10.5)

## 2015-02-15 LAB — CBG MONITORING, ED: GLUCOSE-CAPILLARY: 90 mg/dL (ref 65–99)

## 2015-02-15 LAB — TROPONIN I: Troponin I: <0.03 ng/mL (ref <0.031)

## 2015-02-15 LAB — LIPASE, BLOOD: LIPASE: 29 U/L (ref 11–51)

## 2015-02-15 LAB — ACETAMINOPHEN LEVEL: Acetaminophen (Tylenol), Serum: <10 ug/mL — ABNORMAL LOW (ref 10–30)

## 2015-02-15 LAB — I-STAT CG4 LACTIC ACID, ED: Lactic Acid, Venous: 1.49 mmol/L (ref 0.5–2.0)

## 2015-02-15 LAB — ETHANOL: Alcohol, Ethyl (B): <5 mg/dL (ref <5)

## 2015-02-15 MED ORDER — NAPROXEN 250 MG PO TABS
250.0000 mg | ORAL_TABLET | Freq: Two times a day (BID) | ORAL | Status: DC | PRN
Start: 2015-02-15 — End: 2015-09-20

## 2015-02-15 MED ORDER — METHOCARBAMOL 500 MG PO TABS: 500.0000 mg | ORAL_TABLET | Freq: Two times a day (BID) | ORAL | Status: DC | PRN

## 2015-02-15 NOTE — Discharge Instructions (Signed)
°Emergency Department Resource Guide °1) Find a Doctor and Pay Out of Pocket °Although you won't have to find out who is covered by your insurance plan, it is a good idea to ask around and get recommendations. You will then need to call the office and see if the doctor you have chosen will accept you as a new patient and what types of options they offer for patients who are self-pay. Some doctors offer discounts or will set up payment plans for their patients who do not have insurance, but you will need to ask so you aren't surprised when you get to your appointment. ° °2) Contact Your Local Health Department °Not all health departments have doctors that can see patients for sick visits, but many do, so it is worth a call to see if yours does. If you don't know where your local health department is, you can check in your phone book. The CDC also has a tool to help you locate your state's health department, and many state websites also have listings of all of their local health departments. ° °3) Find a Walk-in Clinic °If your illness is not likely to be very severe or complicated, you may want to try a walk in clinic. These are popping up all over the country in pharmacies, drugstores, and shopping centers. They're usually staffed by nurse practitioners or physician assistants that have been trained to treat common illnesses and complaints. They're usually fairly quick and inexpensive. However, if you have serious medical issues or chronic medical problems, these are probably not your best option. ° °No Primary Care Doctor: °- Call [facility] at  832-8000 - they can help you locate a primary care doctor that  accepts your insurance, provides certain services, etc. °- Physician Referral Service- 1-800-533-3463 ° °Chronic Pain Problems: °Organization         Address  Phone   Notes  °[facility]  (336) 297-2271 Patients need to be referred by their primary care doctor.  ° °Medication  Assistance: °Organization         Address  Phone   Notes  °[place] [facility] [address] °[place], [place] [address] (336) 641-8030 --Must be a resident of [place] °-- Must have NO insurance coverage whatsoever (no Medicaid/ Medicare, etc.) °-- The pt. MUST have a primary care doctor that directs their care regularly and follows them in the community °  °[facility]  (866) 331-1348   °[employer]  (888) 892-1162   ° °Agencies that provide inexpensive medical care: °Organization         Address  Phone   Notes  °[facility]  (336) 832-8035   ° Internal Medicine    (336) 832-7272   °[facility] [address] °[place], [place] [address] (336) 832-4777   °[facility] [address], °[place] (336) 271-4999   °[facility]    (336) 373-0678   °[facility]    (336) 272-1050   °[facility] ° [address], [place] Phone:  (336) 832-4444, Fax:  (336) 832-4440 Hours of Operation:  9 am - 6 pm, M-F.  Also accepts Medicaid/Medicare and self-pay.  °[facility] ° [address],  Phone: (336) 832-3150, Fax: (336) 832-3151. Hours of Operation:  8:30 am - 5:30 pm, M-F.  Also accepts Medicaid and self-pay.  °[facility] [place] [address], [place] Phone: (336) 878-6027   °[facility] [address], [place], [place] (336)723-1848, Ext. 123 Mondays & Thursdays: 7-9 AM.  First 15 patients are seen on a first come, first serve basis. °  ° °Medicaid-accepting [place] Providers: ° °Organization         Address  Phone   Notes  °[facility] [address], [place] (336) 641-2100 Also accepts self-pay patients.  °[facility] [address], [place] ° (336) 856-9996   °[facility] [address], [place]  (336) 288-8857   °[facility] [address] [place] [address], [place] (336) 299-7000   °Veita Bland [address], [place]  ° (336) 373-1557 Only accepts [place] Access Medicaid patients after they have their name applied to their card.  ° °Self-Pay (no insurance) in [place]: ° °Organization         Address  Phone   Notes  °Sickle Cell Patients, [facility] [address], [place] (336) 832-1970   °[facility] [address], [place] (336) 832-4400   °[facility] ° [address], [place] (336) 992-4800   °[facility]/Dr. Osei-Bonsu ° [address] [place] [address], [place] or [address], [place] (336) 841-8500 Phone number for both [place] and [place] locations is the same.  °[facility] [address], [place] (336) 299-0000   °[facility] [address] [place] [address], [place] or [address] (336) 852-7530 °(336) 878-2260   °[facility] [address], [place] (336) 350-1642, phone; (336) 294-5005, fax Sees patients 1st and 3rd Saturday of every month.  Must not qualify for public or private insurance (i.e. Medicaid, Medicare, [place] Health Choice, Veterans' Benefits) • Household income should be no more than 200% of the poverty level •The clinic cannot treat you if you are pregnant or think you are pregnant • Sexually transmitted diseases are not treated at the clinic.  ° ° °Dental Care: °Organization         Address  Phone  Notes  °[place] [facility] [address], [place] (336) 641-6152 Accepts children up to age 21 who are enrolled in Medicaid or [place] Health Choice; pregnant women with a Medicaid card; and children who have applied for Medicaid or [place] Health Choice, but were declined, whose parents can pay a reduced fee at time of service.  °[place]  [facility] [place]  [address], [place] (336) 641-7733 Accepts children up to age 21 who are enrolled in Medicaid or [place] Health Choice; pregnant women with a Medicaid card; and children who have applied for Medicaid or [place] Health Choice, but were declined, whose parents can pay a reduced fee at time of service.  °[facility] ° [address],  (336) 641-4533 Patients are seen by appointment only. Walk-ins are not accepted. [facility] will see patients 18 years of age and older. °Monday - Tuesday (8am-5pm) °Most Wednesdays (8:30-5pm) °$30 per visit, cash only  °[facility] ° [address], [place] (336) 641-4533 Patients are seen by appointment only. Walk-ins are not accepted. [facility] will see patients 18 years of age and older. °One   Wednesday Evening (Monthly: Volunteer Based).  $30 per visit, cash only  °[facility]  (919) 537-3737 for adults; Children under age 4, call Graduate Pediatric Dentistry at ([phone]) [phone]. Children aged 4-14, please call ([phone]) [phone] to request a pediatric application. ° Dental services are provided in all areas of dental care including fillings, crowns and bridges, complete and partial dentures, implants, gum treatment, root canals, and extractions. Preventive care is also provided. Treatment is provided to both adults and children. °Patients are selected via a lottery and there is often a waiting list. °  °[facility] [address], °[place] ° (336) 763-8833 [email or web address] °  °[facility] [address], [place], [place] (336)723-1848, Ext. 123 Second and Fourth Thursday of each month, opens at 6:30 AM; Clinic ends at 9 AM.  Patients are seen on a first-come first-served basis, and a limited number are seen during each clinic.  ° °[facility] ° [address], [place], [place] (336) 723-7904    Eligibility Requirements °You must have lived in [place], [place], or [place] counties for at least the last three months. °  You cannot be eligible for state or federal sponsored healthcare insurance, including Veterans Administration, Medicaid, or Medicare. °  You generally cannot be eligible for healthcare insurance through your employer.  °  How to apply: °Eligibility screenings are held every Tuesday and Wednesday afternoon from 1:00 pm until 4:00 pm. You do not need an appointment for the interview!  °[facility] [address], [place], [place] 336-631-2330   °[facility]  336-342-8273   °[facility]  336-703-3100   °[facility]  336-570-6415   ° °Behavioral Health Resources in the Community: °Intensive Outpatient Programs °Organization         Address  Phone  Notes  °[facility] [address], [place], [place] 336-878-6098   °[facility] [address], [place], [place] 336-832-9800   °[facility] [address], [place], [place] ° 336-882-2125   °[facility] [address],  °[place], [place] 1-800-853-5163 or 336-641-4981   °Substance Abuse Resources °Organization         Address  Phone  Notes  °[facility]  336-882-2125   °[facility]  336-784-9470   °[facility]  336-285-9073   °[facility]  336-845-3988   °Residential & Outpatient Substance Abuse Program  1-800-659-3381   °Psychological Services °Organization         Address  Phone  Notes  °[facility]  336- 832-9600   °[facility]  336- 378-7881   °[facility] [address], [place] 1-800-853-5163 or 336-641-4981   ° °Mobile Crisis Teams °Organization         Address  Phone  Notes  °[facility], Mobile Crisis Care Unit  1-877-626-1772   °Assertive °[facility] ° [address]  [place], [place] 336-834-9664   °Sharon DeEsch [address] °[place] 336-554-5454   ° °Self-Help/Support Groups °Organization         Address  Phone             Notes  °[facility]  - variety of support groups  336- 373-1402 Call for more information  °Narcotics Anonymous (NA), [facility] [address], °[place]   2 meetings at this location  ° °  Residential Treatment Programs °Organization         Address  Phone  Notes  °[facility] [address],    °[place]  1-866-801-8205   °[facility] ° [address], [place], [place] 704-293-8524   °[facility] [address], [place] 336-845-3988 Admissions: 8am-3pm M-F  °[facility] [address]    °[place], [place] 336-841-1104   °[facility] [address], [place], [place] 336-379-7146   °[facility] [address]  °[place], [place] 336-285-9073   °[facility] Intensive Outpatient [address], [place], [place] 336-852-3033   °[facility] [address]  °[place], [place] 1-877-615-2722 or 336-784-9470   °[facility] [address] [place], [place] 336-227-7417 Accepts Medicaid  °[facility] [address]  °[place] 1-800-659-3381 Substance Abuse/Addiction Treatment  ° °[place] Behavioral Health Resources °Organization         Address  Phone  Notes  °[facility]  (888) 581-9988   °Julie Brannon, PhD [address] [place], [place]   (336) 349-5553 or (336) 951-0000   °[facility]   [address] °[place], [place] (336) 349-4454   °[facility] [address], [place], [place] (336) 342-8316 Insurance/Medicaid/sponsorship through [facility]  °[facility] [address]                                    [place], [place] (336) 342-8316 Therapy/tele-psych/case    °[facility] [address]  ° [place], [place] (336) 349-2233    °Dr. Arfeen  (336) 349-4544   °[facility] [place]  [employer] [place] [facility] 1) [address], [place] °2) [address], [place] °3)  [address], [place] (336) 349-3220 °(336) 342-7768 ° °(336) 342-8140   °[place] [facility] (336) 342-1394 or (336) 342-3537 (After Hours)    ° ° °Take your usual prescriptions as previously directed.  Call your regular medical doctor on Monday to schedule a follow up appointment within the next 3 days.  Return to the Emergency Department immediately sooner if worsening.  ° °

## 2015-02-15 NOTE — ED Provider Notes (Signed)
CSN: [id number]     Arrival date & time 02/15/15  1720 History   First MD Initiated Contact with Patient 02/15/15 1756     Chief Complaint  Patient presents with  . Loss of Consciousness  . Flank Pain      HPI Pt was seen at 1810. Per pt, c/o gradual onset and persistence of waxing and waning right sided flank "pain" for the past 1 week. Pt states he "felt lightheaded" 1 week ago, "but didn't pass out." Pt states he was sitting in jail today "getting ready to do my weekend time," started to have pain in his right flank, and then "passed out." Denies CP/palpitaitons, no SOB/cough, no abd pain, no N/V/D, no fevers, no rash, no visual changes, no focal motor weakness, no tingling/numbness in extremities, no ataxia, no slurred speech, no facial droop.       Past Medical History  Diagnosis Date  . Diabetic foot ulcer ([place])   . ETOH abuse   . Anxiety   . Open wound     bottom of foot  . Diabetes mellitus without complication (HCC)     borderline  . Neuromuscular disorder (HCC)     neuropathy  . GERD (gastroesophageal reflux disease)     tums  . Pneumonia ~ 2012  . History of blood transfusion     "related to left knee OR; probably right hip too" (04/21/2012)  . Stroke [facility]) 2008    "they said I might have had one during right hip replacement" (04/21/2012)  . Arthritis     "everywhere" (04/21/2012)  . Mental disorder   . Depression   . DDD (degenerative disc disease)   . Cellulitis and abscess of foot 12/19/2013    LEFT FOOT  . Noncompliance   . Polysubstance abuse     etoh, cocaine, heroin  . Chronic pain    Past Surgical History  Procedure Laterality Date  . Total hip arthroplasty Right 2008  . Total knee arthroplasty Left 2006  . Joint replacement    . Lung lobectomy Left ~ 2006  . Revision total hip arthroplasty Right 2008    "4-5 months after replacement" (04/21/2012)  . Knee arthroscopy Bilateral 1980's/1990's  . Lung lobectomy    . Metatarsal osteotomy  10/29/2011   Procedure: METATARSAL OSTEOTOMY;  Surgeon: [name], MD;  Location: [place];  Service: Orthopedics;  Laterality: Left;  Left 1st Metatarsal Dorsal Closing Wedge   . Total knee arthroplasty Right 04/20/2012  . Total knee arthroplasty Right 04/20/2012    Procedure: TOTAL KNEE ARTHROPLASTY;  Surgeon: [name], MD;  Location: [place];  Service: Orthopedics;  Laterality: Right;  Right Total Knee Arthroplasty   Family History  Problem Relation Age of Onset  . Diabetes Father    Social History  Substance Use Topics  . Smoking status: Current Every Day Smoker -- 1.00 packs/day for 30 years    Types: Cigarettes  . Smokeless tobacco: Never Used     Comment: 04/21/2012 offered smoking cessation materials; pt declines  12/19/2013 DECLINES SMOKING CESSATION MATERIALS  . Alcohol Use: No     Comment: Quit 09/20/14    Review of Systems ROS: Statement: All systems negative except as marked or noted in the HPI; Constitutional: Negative for fever and chills. ; ; Eyes: Negative for eye pain, redness and discharge. ; ; ENMT: Negative for ear pain, hoarseness, nasal congestion, sinus pressure and sore throat. ; ; Cardiovascular: Negative for chest pain, palpitations, diaphoresis, dyspnea and  peripheral edema. ; ; Respiratory: Negative for cough, wheezing and stridor. ; ; Gastrointestinal: Negative for nausea, vomiting, diarrhea, abdominal pain, blood in stool, hematemesis, jaundice and rectal bleeding. . ; ; Genitourinary: Negative for dysuria, flank pain and hematuria. ; ; Musculoskeletal: +LBP. Negative for neck pain. Negative for swelling and trauma.; ; Skin: Negative for pruritus, rash, abrasions, blisters, bruising and skin lesion.; ; Neuro: Negative for headache and neck stiffness. Negative for weakness, extremity weakness, paresthesias, involuntary movement, seizure and +lightheadedness, +syncope.      Allergies  Benadryl; Ibuprofen; and Trazodone and nefazodone  Home Medications   Prior to Admission  medications   Medication Sig Start Date End Date Taking? Authorizing Provider  clonazePAM (KLONOPIN) 1 MG tablet Take 1 mg by mouth 4 (four) times daily.   Yes Historical Provider, MD  escitalopram (LEXAPRO) 20 MG tablet Take 1 tablet (20 mg total) by mouth every morning. For depression 03/29/13  Yes Encarnacion Slates, NP  gabapentin (NEURONTIN) 400 MG capsule Take 1 capsule (400 mg total) by mouth 4 (four) times daily. For substance withdrawal syndrome 03/29/13  Yes Encarnacion Slates, NP  metFORMIN (GLUCOPHAGE) 500 MG tablet Take 1 tablet (500 mg total) by mouth 2 (two) times daily with a meal. 01/15/14  Yes Philemon Kingdom, MD  mirtazapine (REMERON) 30 MG tablet Take 30 mg by mouth at bedtime.   Yes Historical Provider, MD  QUEtiapine (SEROQUEL) 200 MG tablet Take 1 tablet (200 mg total) by mouth at bedtime. For mood control 03/29/13  Yes Encarnacion Slates, NP  doxycycline (VIBRAMYCIN) 100 MG capsule Take 1 capsule (100 mg total) by mouth 2 (two) times daily. Patient not taking: Reported on 02/15/2015 10/27/14   Kathie Dike, MD  furosemide (LASIX) 40 MG tablet Take 1 tablet (40 mg total) by mouth daily as needed for fluid. Patient not taking: Reported on 02/15/2015 10/27/14   Kathie Dike, MD  oxyCODONE-acetaminophen (PERCOCET/ROXICET) 5-325 MG tablet Take 1.5 tablets by mouth every 6 (six) hours as needed for severe pain. Patient not taking: Reported on 02/15/2015 10/27/14   Kathie Dike, MD   BP 114/90 mmHg  Pulse 83  Resp 14  SpO2 99%   18:28 Orthostatic Vital Signs CS  Orthostatic Lying  - BP- Lying: 128/86 mmHg ; Pulse- Lying: 78  Orthostatic Sitting - BP- Sitting: 124/82 mmHg ; Pulse- Sitting: 85  Orthostatic Standing at 0 minutes - BP- Standing at 0 minutes: 114/90 mmHg ; Pulse- Standing at 0 minutes: 83       Physical Exam  1815: Physical examination:  Nursing notes reviewed; Vital signs and O2 SAT reviewed;  Constitutional: Well developed, Well nourished, Well hydrated, In no acute  distress; Head:  Normocephalic, atraumatic; Eyes: EOMI, PERRL, No scleral icterus; ENMT: Mouth and pharynx normal, Mucous membranes moist; Neck: Supple, Full range of motion, No lymphadenopathy; Cardiovascular: Regular rate and rhythm, No murmur, rub, or gallop; Respiratory: Breath sounds clear & equal bilaterally, No rales, rhonchi, wheezes.  Speaking full sentences with ease, Normal respiratory effort/excursion; Chest: Nontender, Movement normal; Abdomen: Soft, Nontender, Nondistended, Normal bowel sounds; Genitourinary: No CVA tenderness; Spine:  No midline CS, TS, LS tenderness. +TTP right lumbar paraspinal muscles. No rash. No ecchymosis;; Extremities: Pulses normal, No tenderness, No edema, No calf edema or asymmetry.; Neuro: AA&Ox3, Major CN grossly intact. No facial droop. Speech clear. No gross focal motor or sensory deficits in extremities.; Skin: Color normal, Warm, Dry.   ED Course  Procedures (including critical care time) Labs Review  Imaging  Review  I have personally reviewed and evaluated these images and lab results as part of my medical decision-making.   EKG Interpretation   Date/Time:  Friday February 15 2015 17:28:54 EST Ventricular Rate:  80 PR Interval:  180 QRS Duration: 94 QT Interval:  380 QTC Calculation: 438 R Axis:   -3 Text Interpretation:  Normal sinus rhythm Normal ECG When compared with  ECG of 04/06/2012 No significant change was found Confirmed by [facility]  MD,  Nunzio Cory [phone]) on 02/15/2015 6:17:30 PM      MDM  MDM Reviewed: previous chart, nursing note and vitals Reviewed previous: labs and ECG Interpretation: labs, ECG, x-ray and CT scan     Results for orders placed or performed during the hospital encounter of 02/15/15  Comprehensive metabolic panel  Result Value Ref Range   Sodium 142 135 - 145 mmol/L   Potassium 3.9 3.5 - 5.1 mmol/L   Chloride 107 101 - 111 mmol/L   CO2 27 22 - 32 mmol/L   Glucose, Bld 112 (H) 65 - 99 mg/dL   BUN 16 6  - 20 mg/dL   Creatinine, Ser 1.04 0.61 - 1.24 mg/dL   Calcium 9.2 8.9 - 10.3 mg/dL   Total Protein 6.8 6.5 - 8.1 g/dL   Albumin 4.0 3.5 - 5.0 g/dL   AST 22 15 - 41 U/L   ALT 20 17 - 63 U/L   Alkaline Phosphatase 74 38 - 126 U/L   Total Bilirubin 0.5 0.3 - 1.2 mg/dL   GFR calc non Af Amer >60 >60 mL/min   GFR calc Af Amer >60 >60 mL/min   Anion gap 8 5 - 15  Acetaminophen level  Result Value Ref Range   Acetaminophen (Tylenol), Serum <10 (L) 10 - 30 ug/mL  Ethanol  Result Value Ref Range   Alcohol, Ethyl (B) <5 <5 mg/dL  Lipase, blood  Result Value Ref Range   Lipase 29 11 - 51 U/L  Salicylate level  Result Value Ref Range   Salicylate Lvl 123456 2.8 - 30.0 mg/dL  Troponin I  Result Value Ref Range   Troponin I <0.03 <0.031 ng/mL  CBC with Differential  Result Value Ref Range   WBC 9.1 4.0 - 10.5 K/uL   RBC 5.09 4.22 - 5.81 MIL/uL   Hemoglobin 14.2 13.0 - 17.0 g/dL   HCT 43.3 39.0 - 52.0 %   MCV 85.1 78.0 - 100.0 fL   MCH 27.9 26.0 - 34.0 pg   MCHC 32.8 30.0 - 36.0 g/dL   RDW 15.0 11.5 - 15.5 %   Platelets 146 (L) 150 - 400 K/uL   Neutrophils Relative % 58 %   Neutro Abs 5.2 1.7 - 7.7 K/uL   Lymphocytes Relative 34 %   Lymphs Abs 3.1 0.7 - 4.0 K/uL   Monocytes Relative 5 %   Monocytes Absolute 0.5 0.1 - 1.0 K/uL   Eosinophils Relative 3 %   Eosinophils Absolute 0.3 0.0 - 0.7 K/uL   Basophils Relative 0 %   Basophils Absolute 0.0 0.0 - 0.1 K/uL  Urinalysis, Routine w reflex microscopic  Result Value Ref Range   Color, Urine YELLOW YELLOW   APPearance CLEAR CLEAR   Specific Gravity, Urine <1.005 (L) 1.005 - 1.030   pH 6.0 5.0 - 8.0   Glucose, UA 250 (A) NEGATIVE mg/dL   Hgb urine dipstick NEGATIVE NEGATIVE   Bilirubin Urine NEGATIVE NEGATIVE   Ketones, ur NEGATIVE NEGATIVE mg/dL   Protein, ur NEGATIVE NEGATIVE mg/dL  Nitrite NEGATIVE NEGATIVE   Leukocytes, UA NEGATIVE NEGATIVE  Urine rapid drug screen (hosp performed)  Result Value Ref Range   Opiates NONE  DETECTED NONE DETECTED   Cocaine POSITIVE (A) NONE DETECTED   Benzodiazepines POSITIVE (A) NONE DETECTED   Amphetamines NONE DETECTED NONE DETECTED   Tetrahydrocannabinol NONE DETECTED NONE DETECTED   Barbiturates NONE DETECTED NONE DETECTED  POC CBG, ED  Result Value Ref Range   Glucose-Capillary 90 65 - 99 mg/dL  I-Stat CG4 Lactic Acid, ED  Result Value Ref Range   Lactic Acid, Venous 1.49 0.5 - 2.0 mmol/L   Dg Chest 2 View 02/15/2015  CLINICAL DATA:  Right-sided chest pain EXAM: CHEST  2 VIEW COMPARISON:  12/06/2013 FINDINGS: Cardiac shadow is within normal limits. The lungs are well aerated bilaterally. No focal infiltrate or sizable effusion is seen. No acute bony abnormality is noted. IMPRESSION: No active cardiopulmonary disease. Electronically Signed   By: Inez Catalina M.D.   On: 02/15/2015 19:32   Ct Head Wo Contrast 02/15/2015  CLINICAL DATA:  Syncopal episode EXAM: CT HEAD WITHOUT CONTRAST TECHNIQUE: Contiguous axial images were obtained from the base of the skull through the vertex without intravenous contrast. COMPARISON:  02/07/2011 FINDINGS: Atrophic changes are noted. The bony calvarium is intact. No findings to suggest acute hemorrhage, acute infarction or space-occupying mass lesion are seen. IMPRESSION: Atrophic changes without acute abnormality. Electronically Signed   By: Inez Catalina M.D.   On: 02/15/2015 19:28   Ct Renal Stone Study 02/15/2015  CLINICAL DATA:  Acute onset right-sided flank pain today. Syncopal episode. EXAM: CT ABDOMEN AND PELVIS WITHOUT CONTRAST TECHNIQUE: Multidetector CT imaging of the abdomen and pelvis was performed following the standard protocol without IV contrast. COMPARISON:  11/07/2009 FINDINGS: Lower chest:  No acute findings. Hepatobiliary: No mass visualized on this un-enhanced exam. Gallbladder is unremarkable. Pancreas: No mass or inflammatory process identified on this un-enhanced exam. Spleen: Within normal limits in size. Adrenals/Urinary  Tract: No evidence of urolithiasis or hydronephrosis. No definite mass visualized on this un-enhanced exam. Stomach/Bowel: No evidence of obstruction, inflammatory process, or abnormal fluid collections. Normal appendix visualized. Vascular/Lymphatic: No pathologically enlarged lymph nodes. No evidence of abdominal aortic aneurysm. Reproductive: No mass or other significant abnormality. Other: Right hip prosthesis results in streak artifact through the inferior pelvis. Musculoskeletal:  No suspicious bone lesions identified. IMPRESSION: No evidence of urolithiasis, hydronephrosis, or other acute findings. Electronically Signed   By: Earle Gell M.D.   On: 02/15/2015 19:34    2145:  Pt not orthostatic. VS remain stable. Workup reassuring. Pt has been sleeping most of his ED visit.  Pt counseled regarding cocaine use. Doubt PE with low risk Well's. Low risk syncope per [facility] rule.  Pt states he wants to go home now. Dx and testing d/w pt.  Questions answered.  Verb understanding, agreeable to d/c home with outpt f/u.         Francine Graven, DO 02/17/15 1024

## 2015-02-15 NOTE — ED Notes (Signed)
Pt reports was in jail today and started having pain in r side and passed out.  Pt reports was sitting down and passed out.  Denies hitting head.  Pt says the same thing almost happened Friday.  CBG 230 at the jail today prior to arrival.

## 2015-02-21 IMAGING — CR DG KNEE COMPLETE 4+V*R*
4 series · 4 of 4 positions shown · non-contrast
Comparison: 04/20/2012

CLINICAL DATA: Right knee pain post fall, right knee surgery 2
months ago

RIGHT KNEE - COMPLETE 4+ VIEW

[view not recorded (1 of 4)]
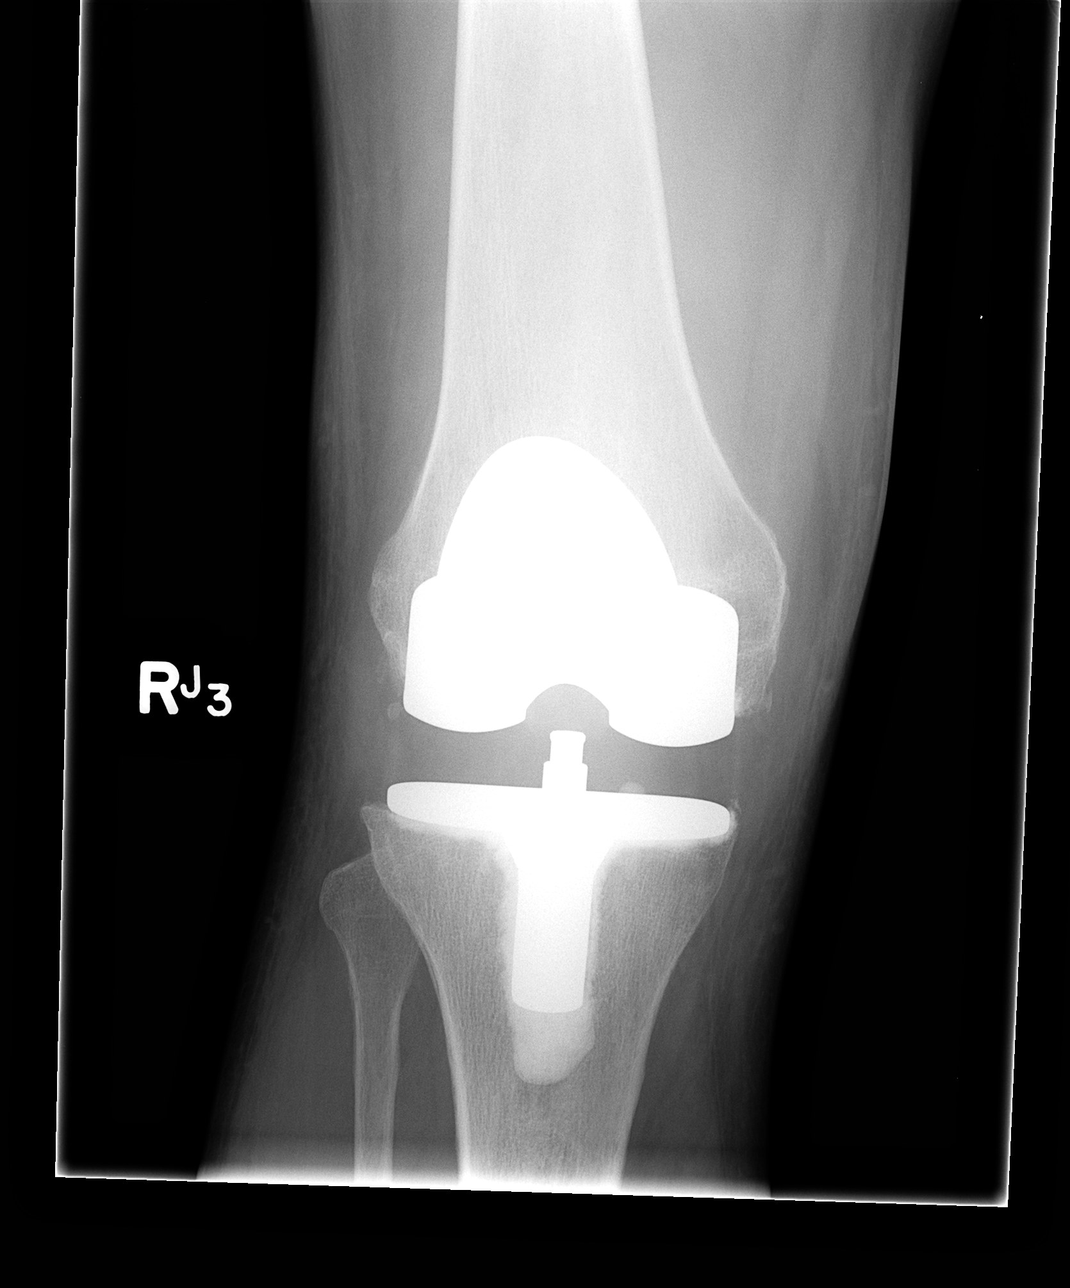

[view not recorded (2 of 4)]
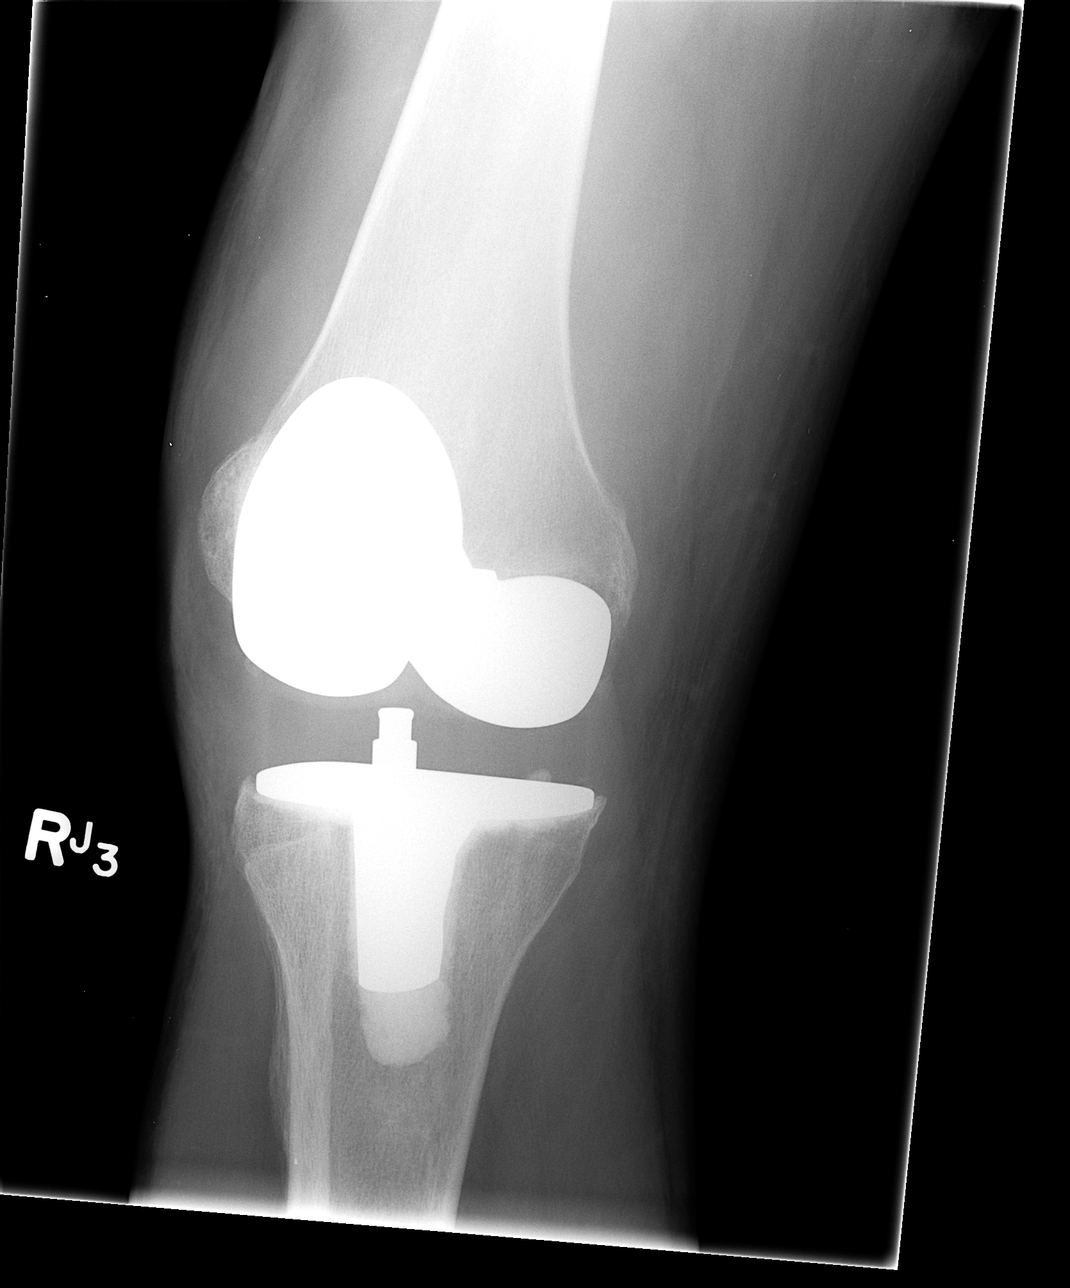

[view not recorded (3 of 4)]
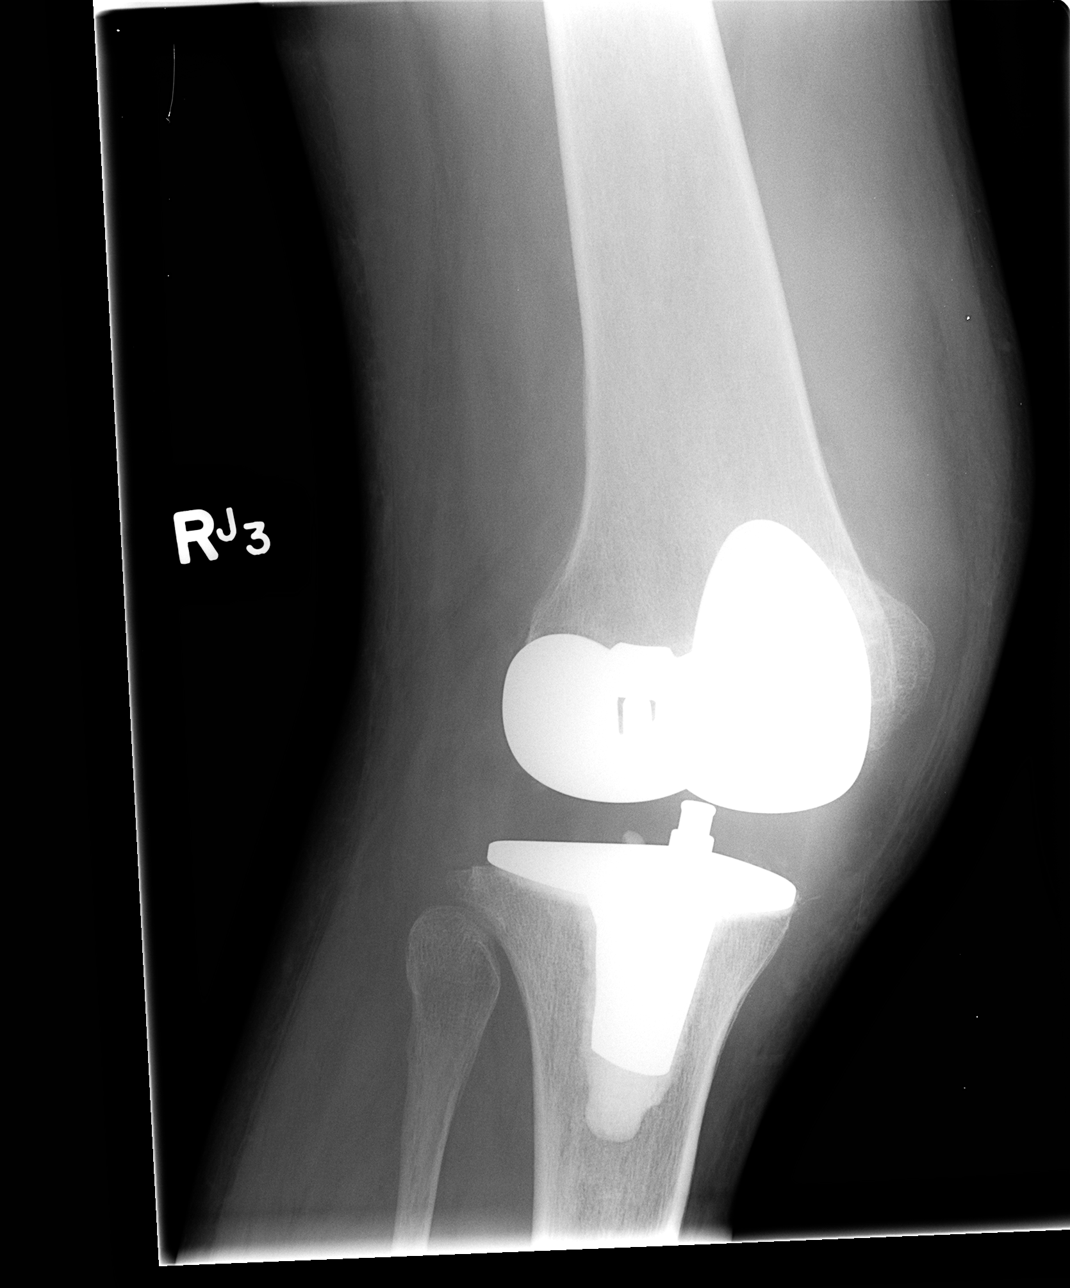

[view not recorded (4 of 4)]
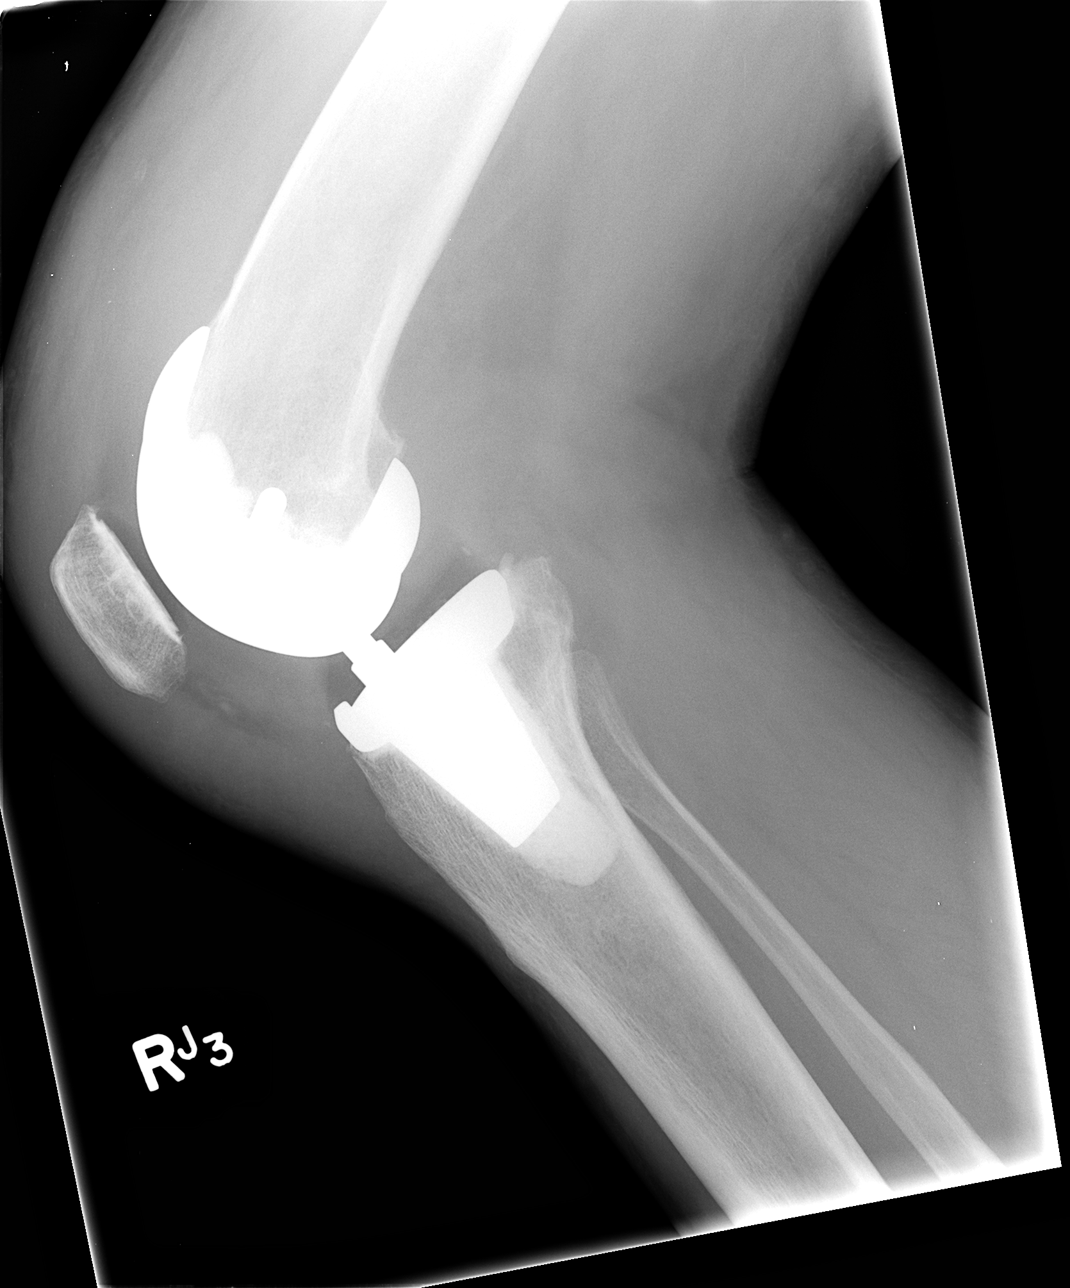

[4 of 4 positions shown; findings below may reference images not displayed]

FINDINGS: Components of a right knee prosthesis are identified in expected
positions.
Bones appear diffusely demineralized.
No acute fracture, dislocation or bone destruction.
No periprosthetic lucency.
Regional soft tissue swelling identified.
Small knee joint effusion.
IMPRESSION: Right knee prosthesis.
Regional soft tissue swelling and small joint effusion.
No acute abnormalities.

## 2015-03-27 IMAGING — CR DG KNEE COMPLETE 4+V*R*
4 series · 4 of 4 positions shown · non-contrast
Comparison: 06/08/2012

CLINICAL DATA: Fall, pain.  Prior right knee replacement.

RIGHT KNEE - COMPLETE 4+ VIEW

[view not recorded (1 of 4)]
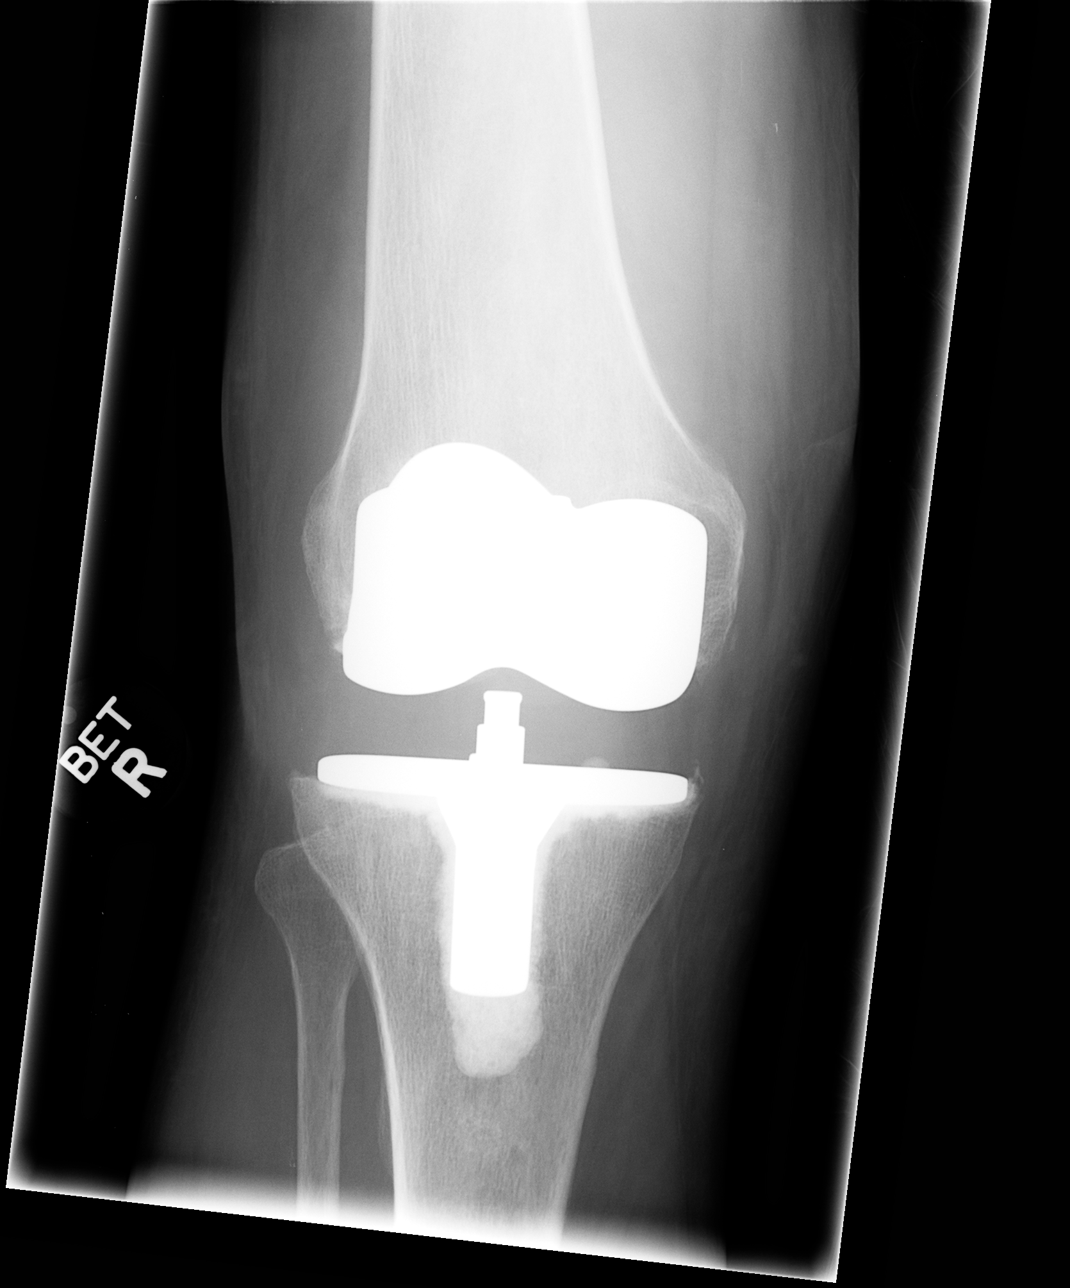

[view not recorded (2 of 4)]
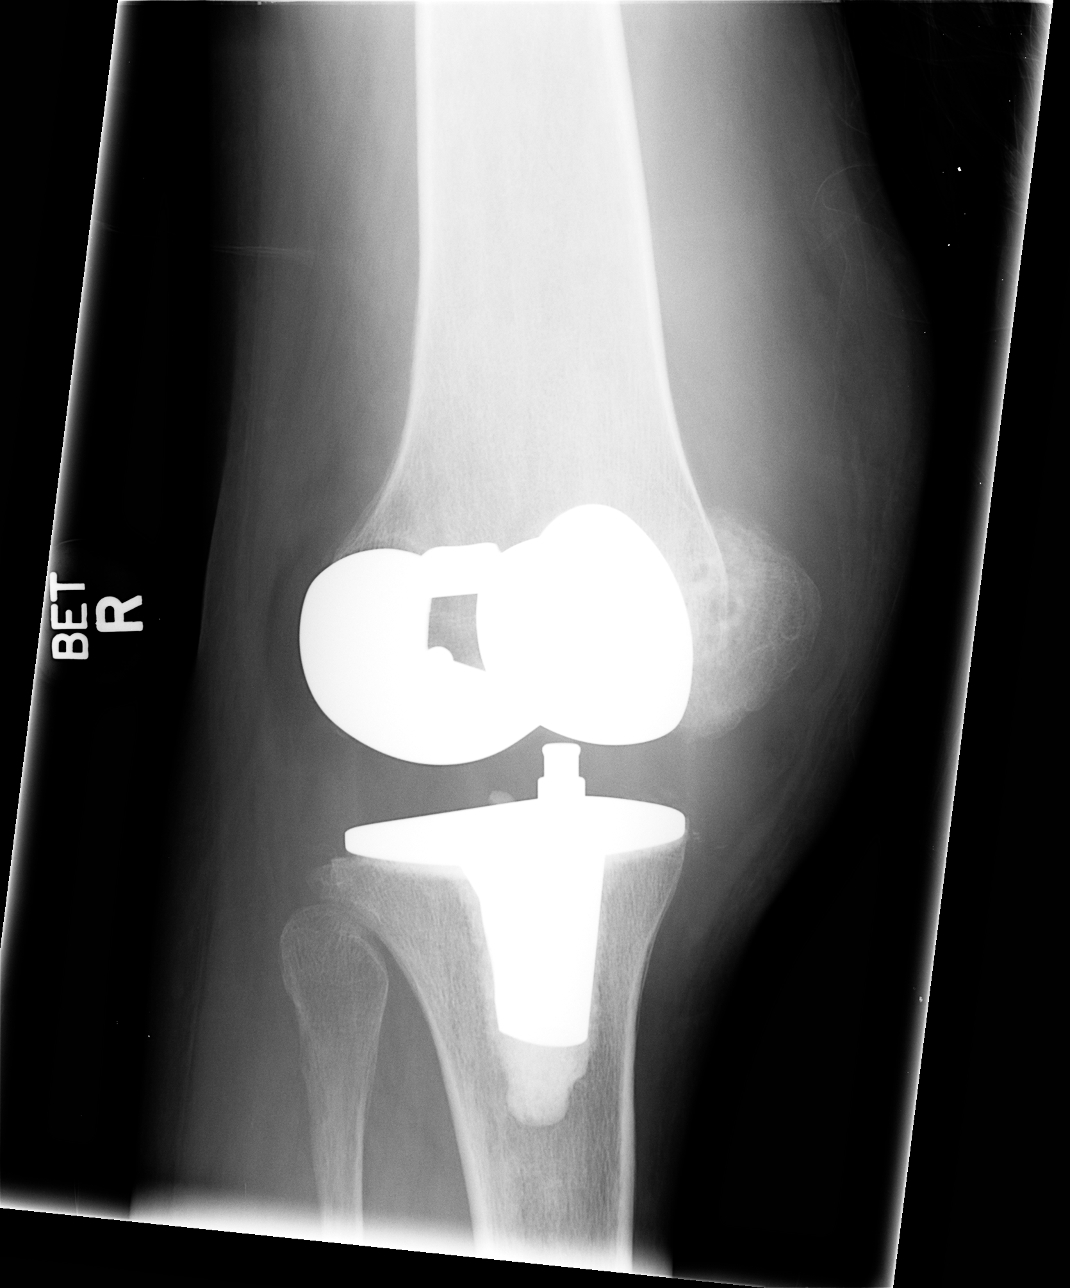

[view not recorded (3 of 4)]
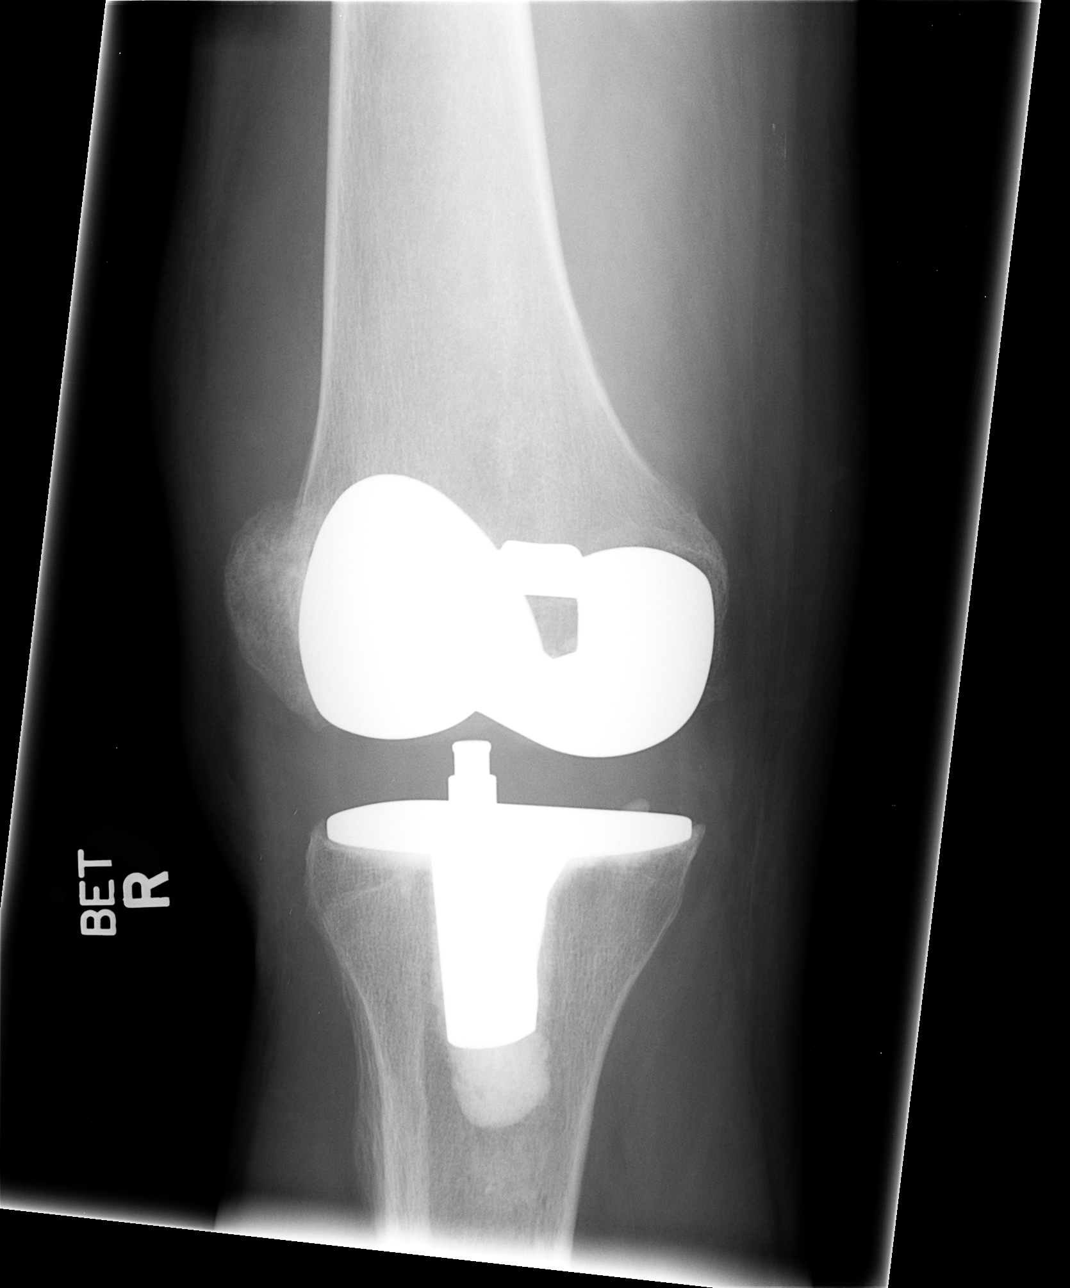

[view not recorded (4 of 4)]
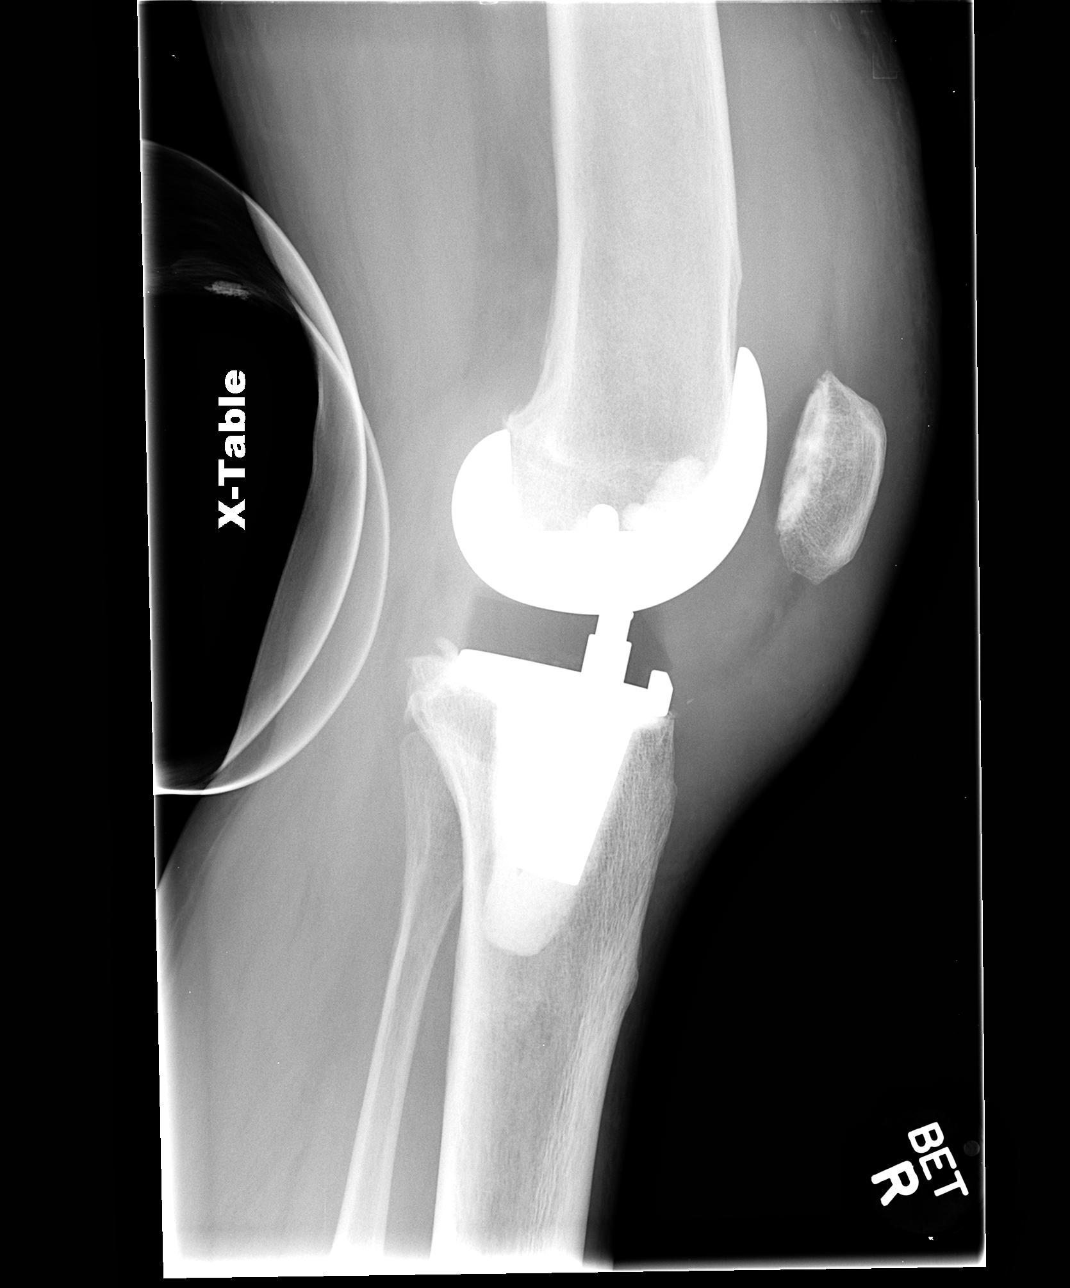

[4 of 4 positions shown; findings below may reference images not displayed]

FINDINGS: Moderate to large joint effusion.  Prior right knee
replacement.  No hardware complicating feature.  No fracture,
subluxation or dislocation.
IMPRESSION: A moderate-to-large joint effusion.  Prior right knee replacement.
No acute bony findings.

## 2015-03-27 IMAGING — CR DG HIP (WITH OR WITHOUT PELVIS) 2-3V*L*
3 series · 3 of 3 positions shown · non-contrast
Comparison: None.

CLINICAL DATA: Fall.  Prior right knee replacement.

LEFT HIP - COMPLETE 2+ VIEW

[view not recorded (1 of 3)]
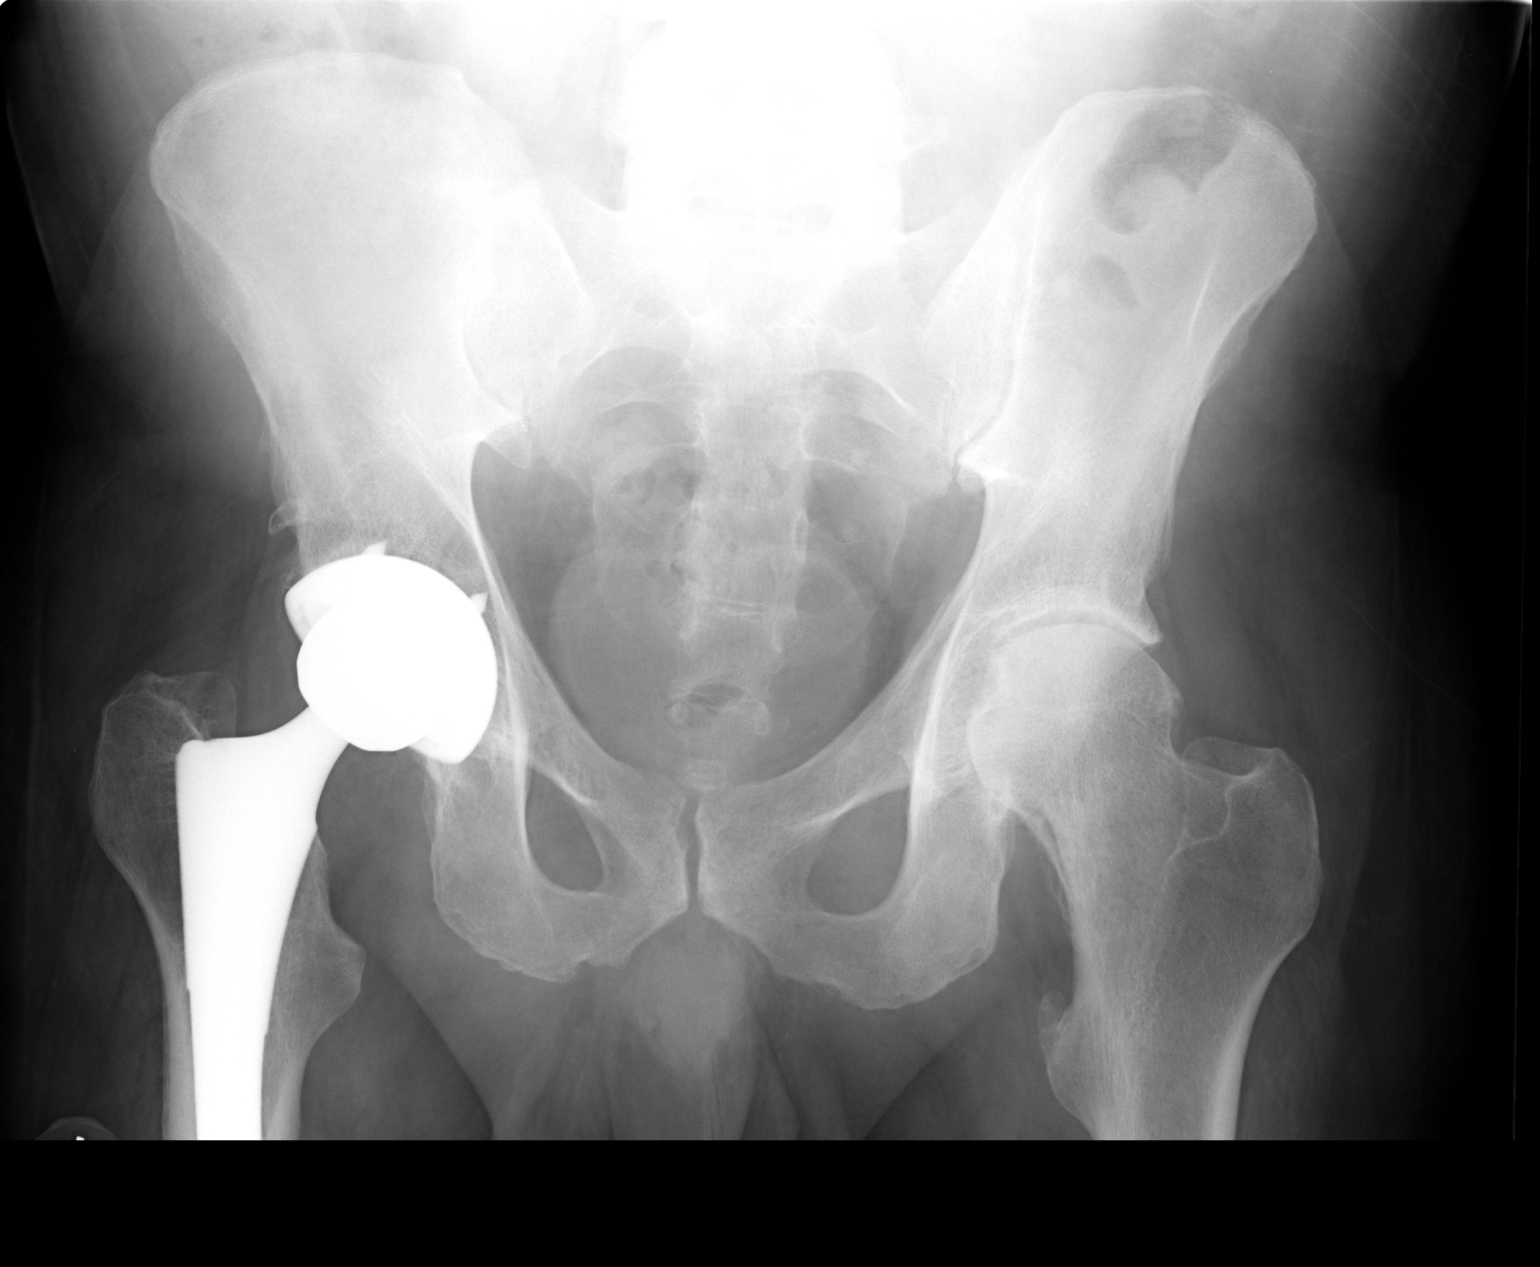

[view not recorded (2 of 3)]
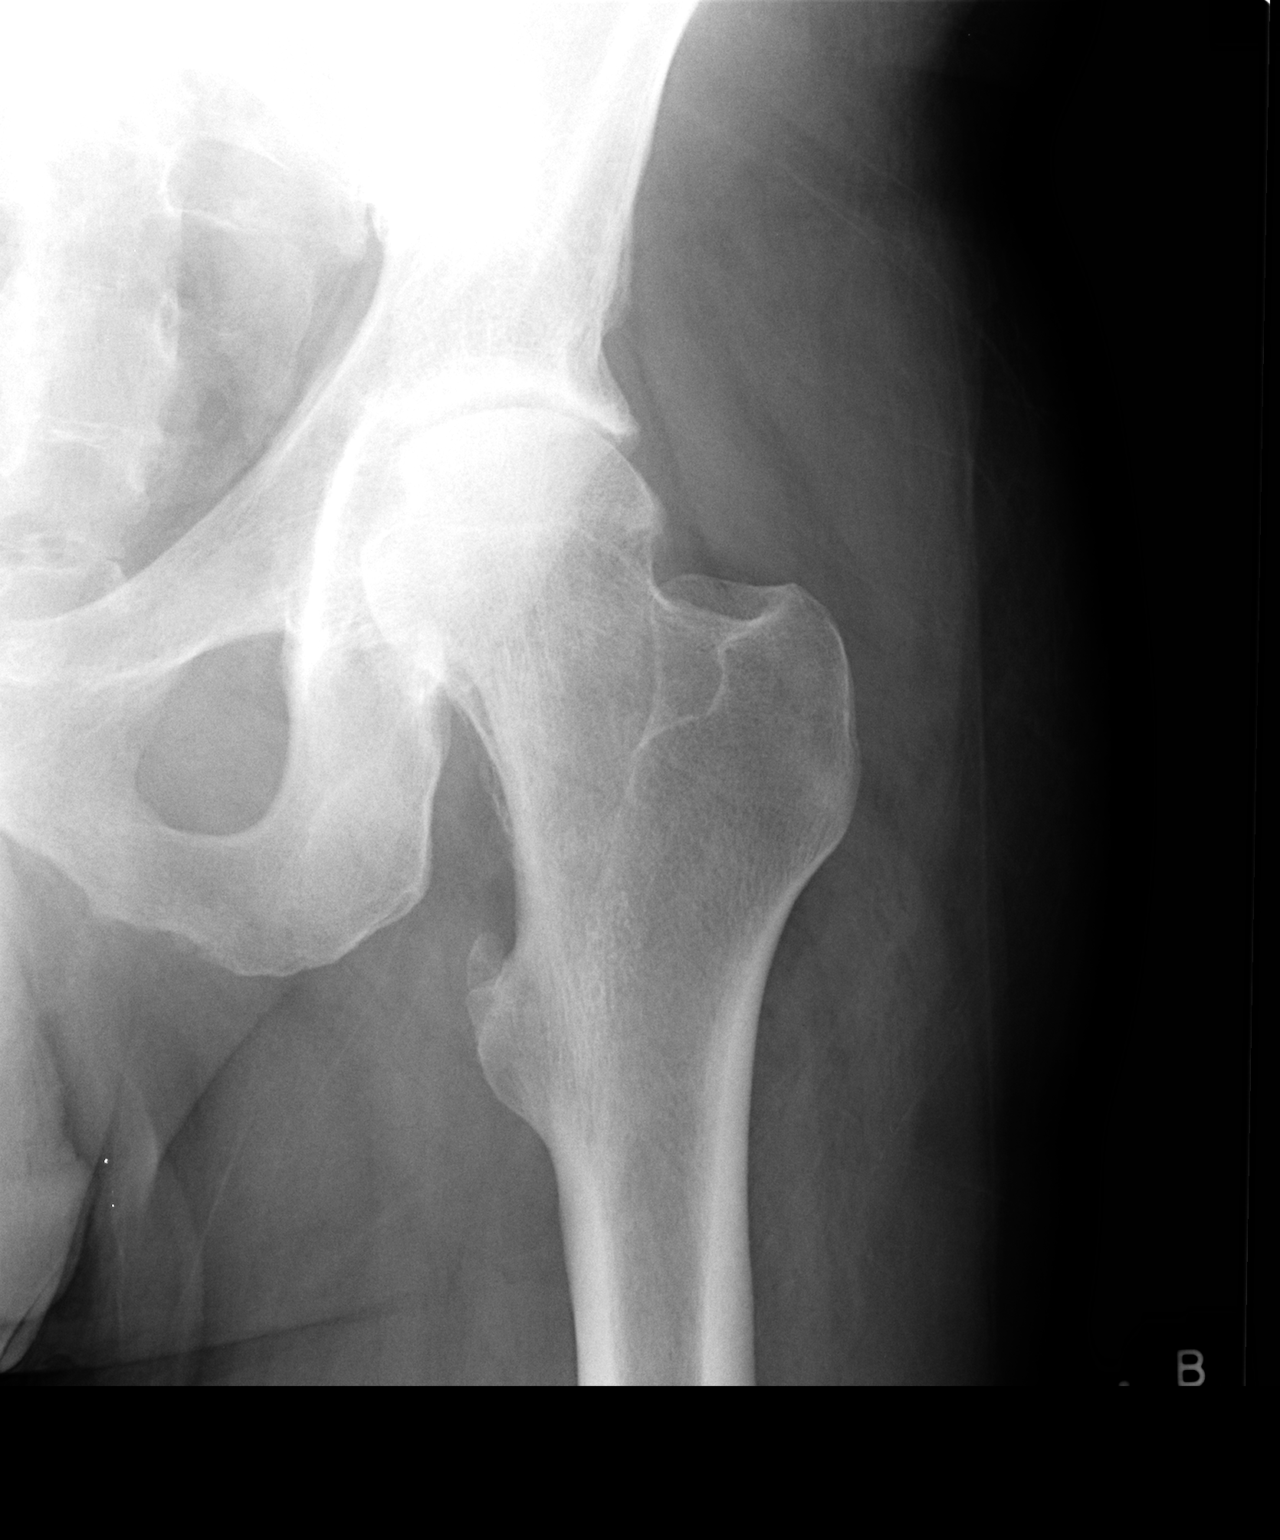

[view not recorded (3 of 3)]
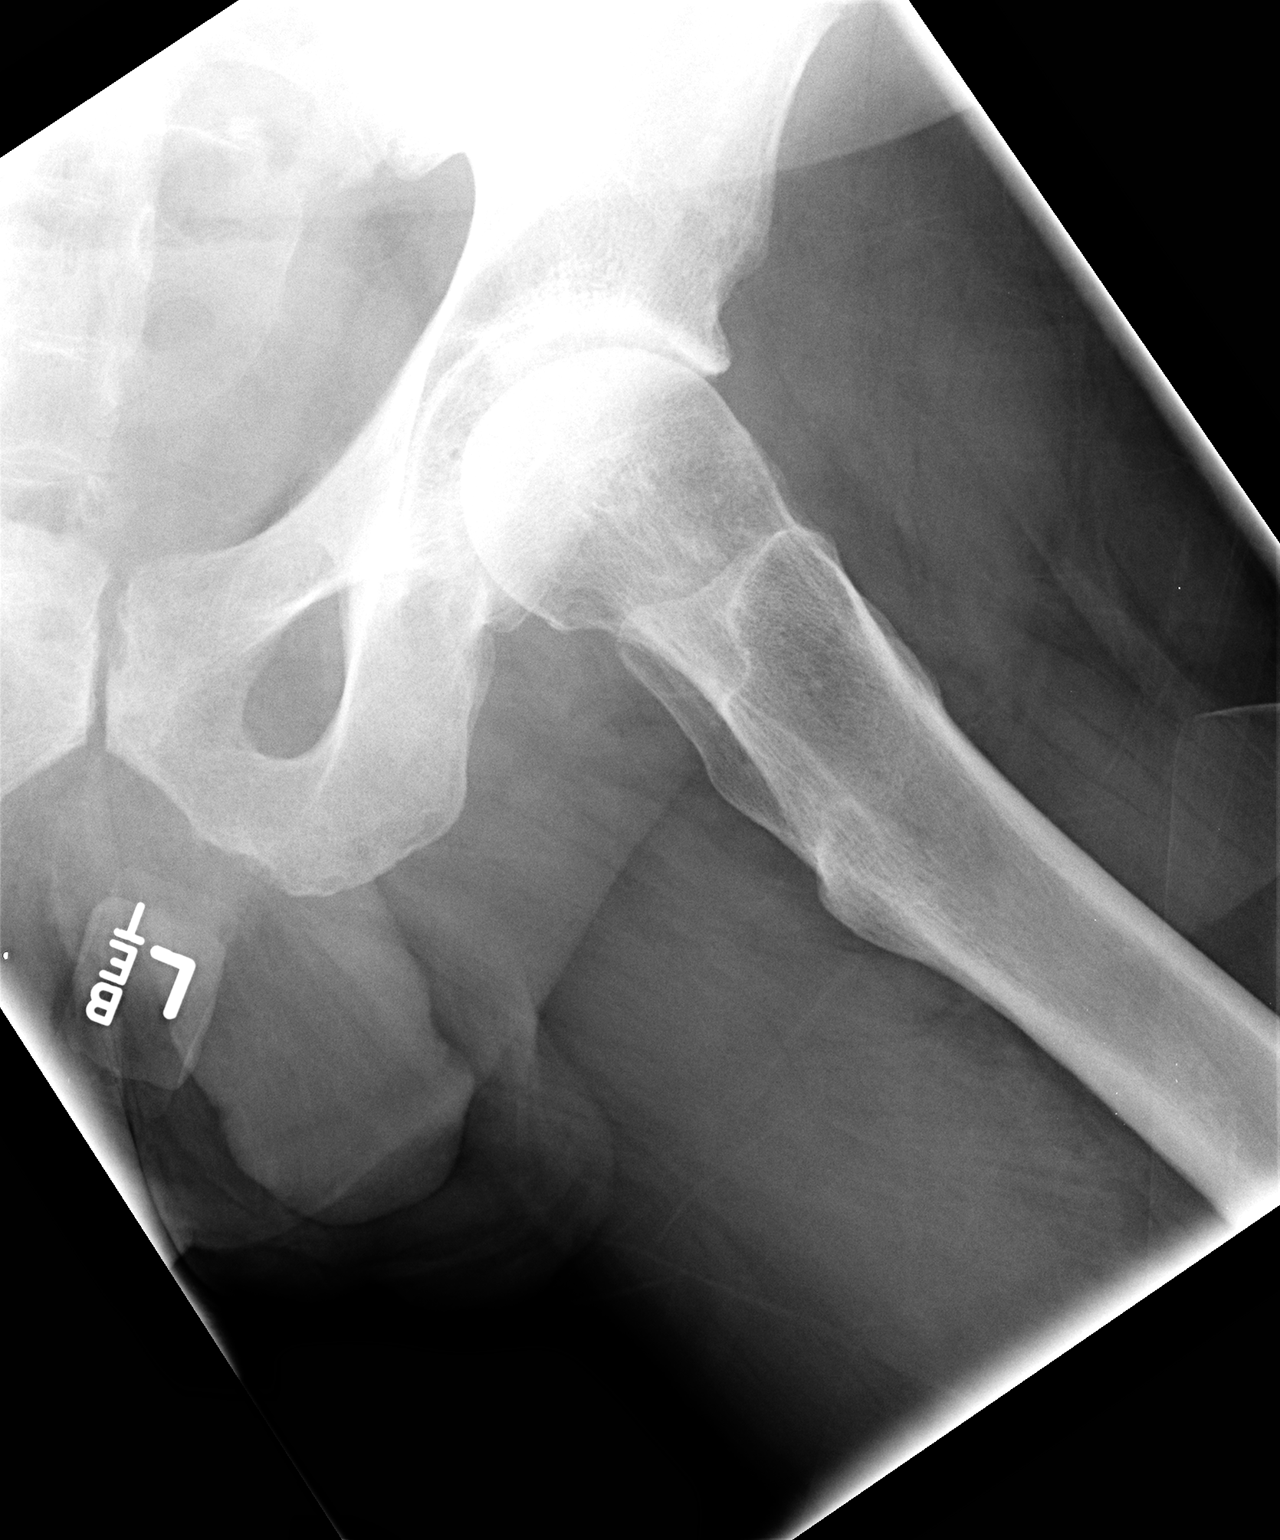

[3 of 3 positions shown; findings below may reference images not displayed]

FINDINGS: Prior right hip replacement.  Moderate degenerative
changes in the left hip.  No fracture, subluxation or dislocation.
IMPRESSION: No acute bony abnormality.

## 2015-03-27 IMAGING — CT CT PELVIS W/O CM
2 of 3 series · 16 of 46 positions shown, 18 images · non-contrast
Comparison: Pelvis 07/12/2012

CLINICAL DATA: Fall.  Left hip pain.  Rule out fracture

CT PELVIS WITHOUT CONTRAST
TECHNIQUE: Multidetector CT imaging of the pelvis was performed
following the standard protocol without intravenous contrast.

[Series 5: pelvis axial st 2.0 · axial · 0.56mm/px · z∈[-231,-17]mm · 13 of 123 slices shown, 15 images]
[im 8/123  soft-tissue]
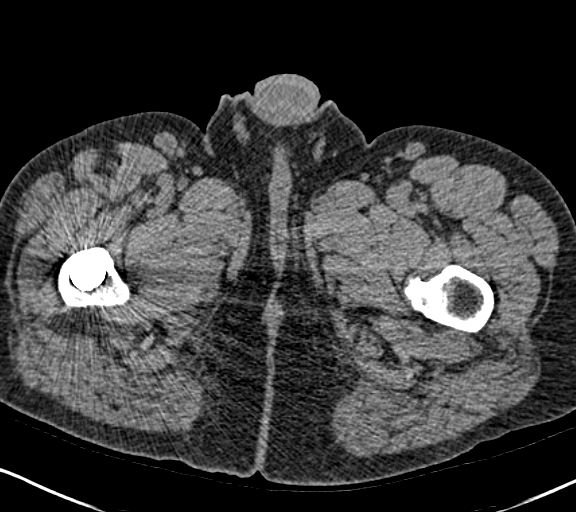
[im 8/123  bone]
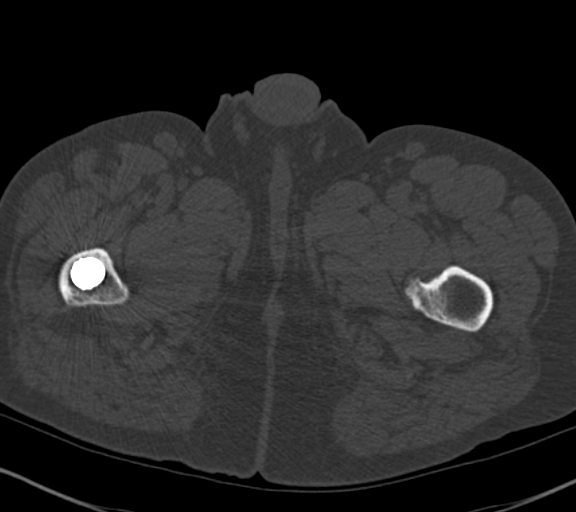
[im 16/123  soft-tissue]
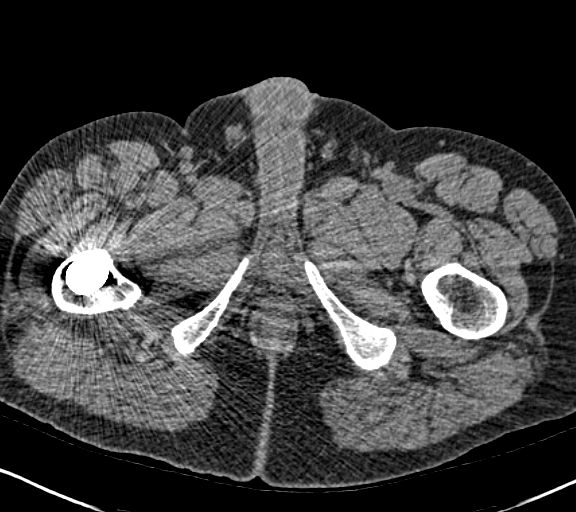
[im 24/123  soft-tissue]
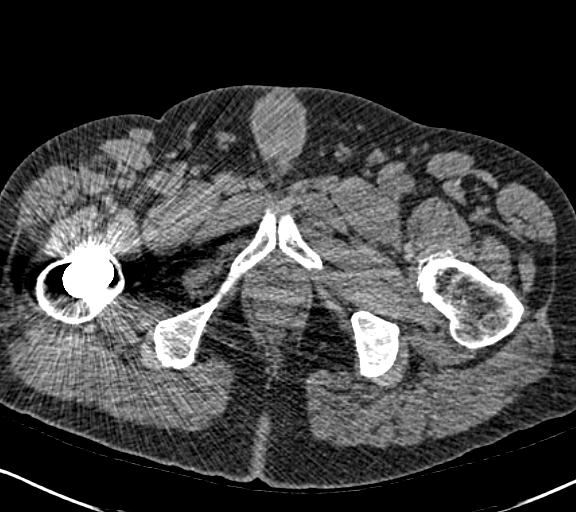
[im 36/123  soft-tissue]
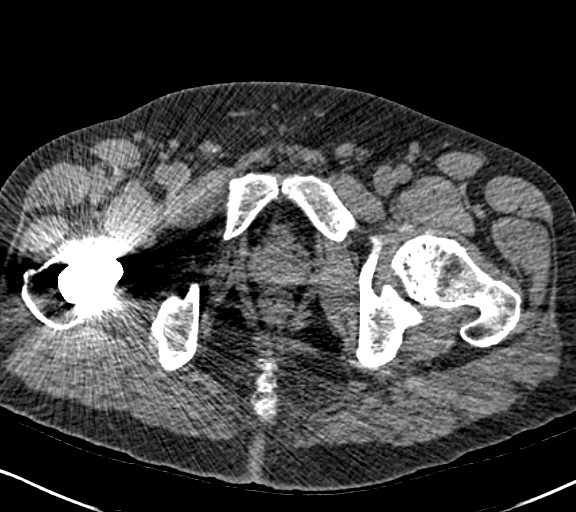
[im 44/123  soft-tissue]
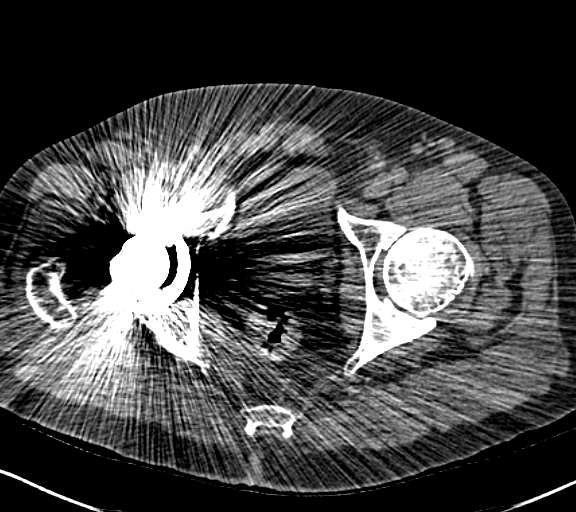
[im 52/123  soft-tissue]
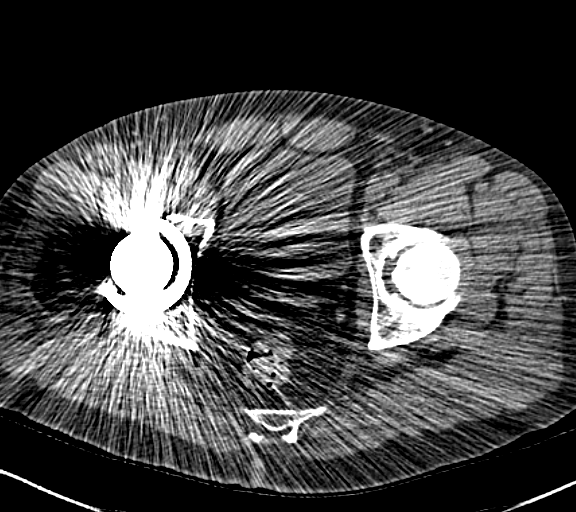
[im 63/123  soft-tissue]
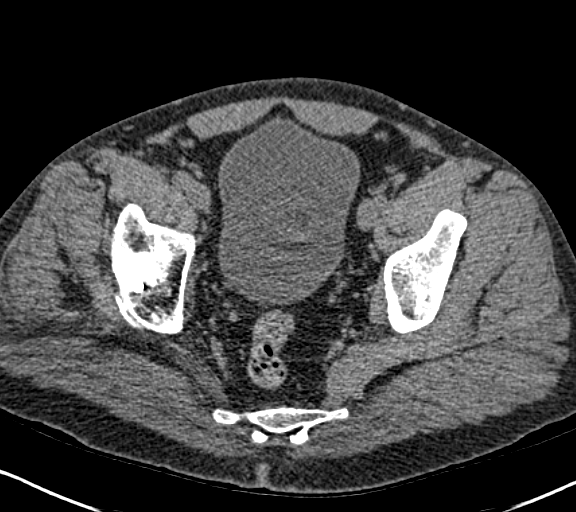
[im 71/123  soft-tissue]
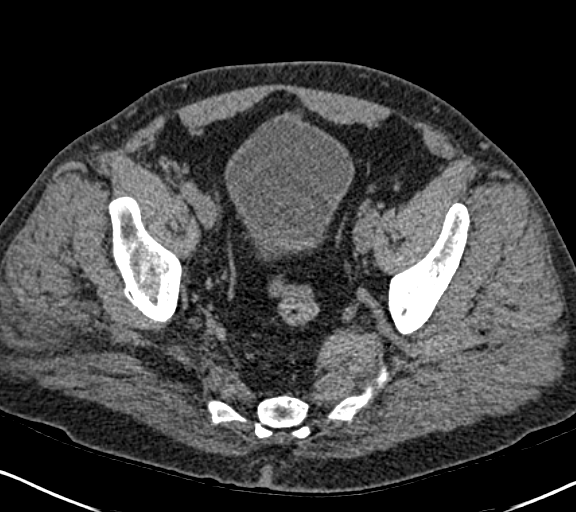
[im 79/123  soft-tissue]
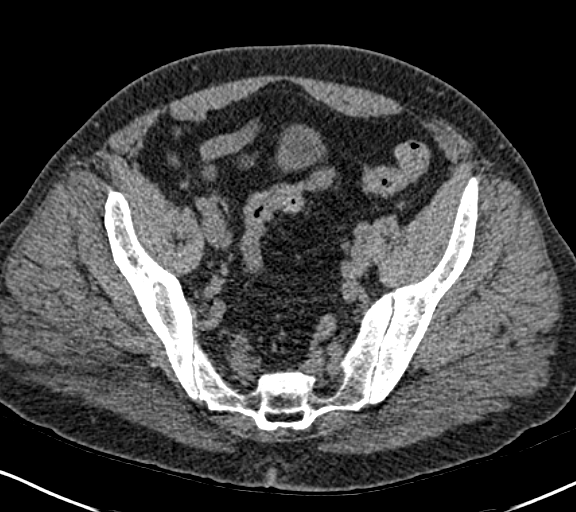
[im 79/123  bone]
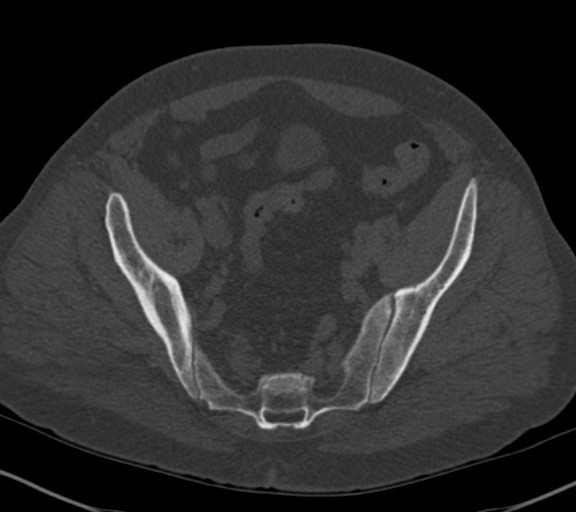
[im 87/123  soft-tissue]
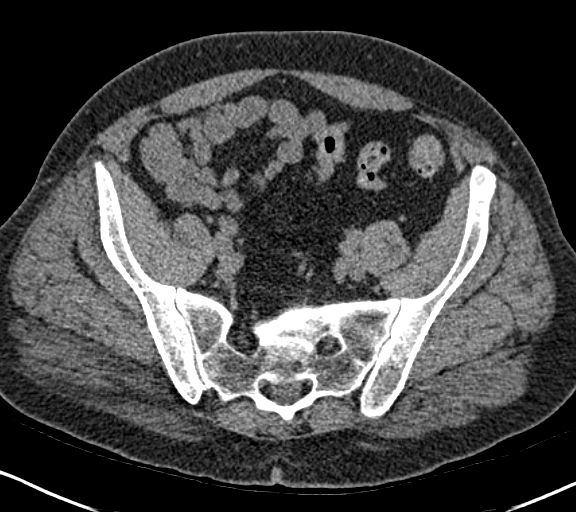
[im 99/123  soft-tissue]
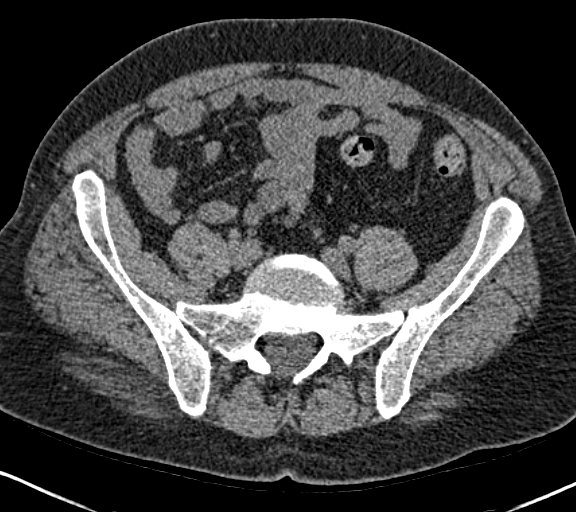
[im 107/123  soft-tissue]
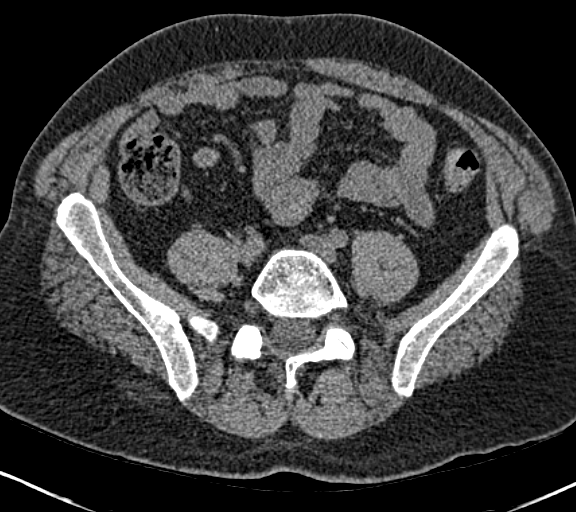
[im 115/123  soft-tissue]
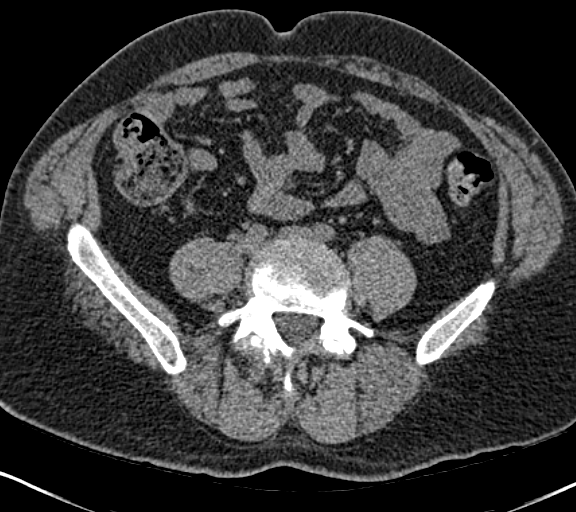

[Series 6: pelvis coronal st 2.0 · coronal · 0.49mm/px · 3 of 132 slices shown]
[im 44/132  soft-tissue]
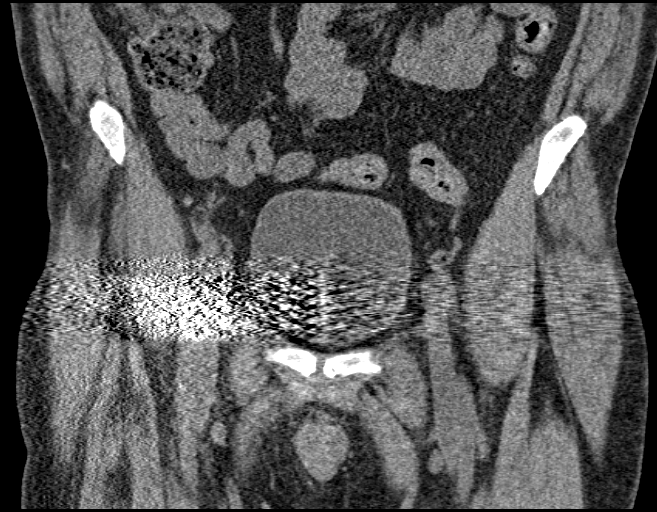
[im 59/132  soft-tissue]
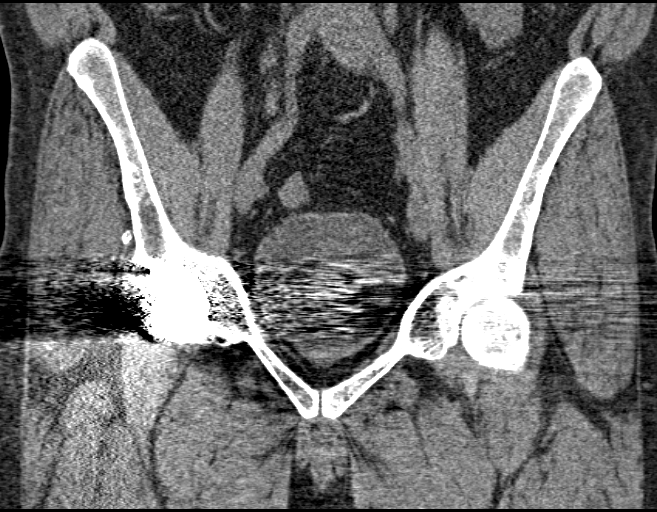
[im 73/132  soft-tissue]
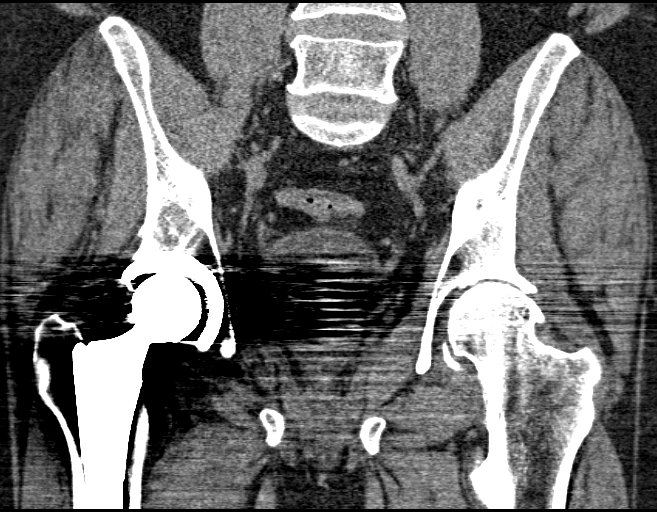

[16 of 46 positions shown; findings below may reference images not displayed]

FINDINGS: Negative for left hip fracture.  No pelvic fracture.
There is mild degenerative change in the left hip.  There  has been
prior right hip replacement.
IMPRESSION: Negative for acute fracture.

## 2015-04-20 IMAGING — CR DG KNEE COMPLETE 4+V*R*
4 series · 4 of 4 positions shown · non-contrast
Comparison: 07/12/2012

CLINICAL DATA: Right knee pain.

RIGHT KNEE - COMPLETE 4+ VIEW

[view not recorded (1 of 4)]
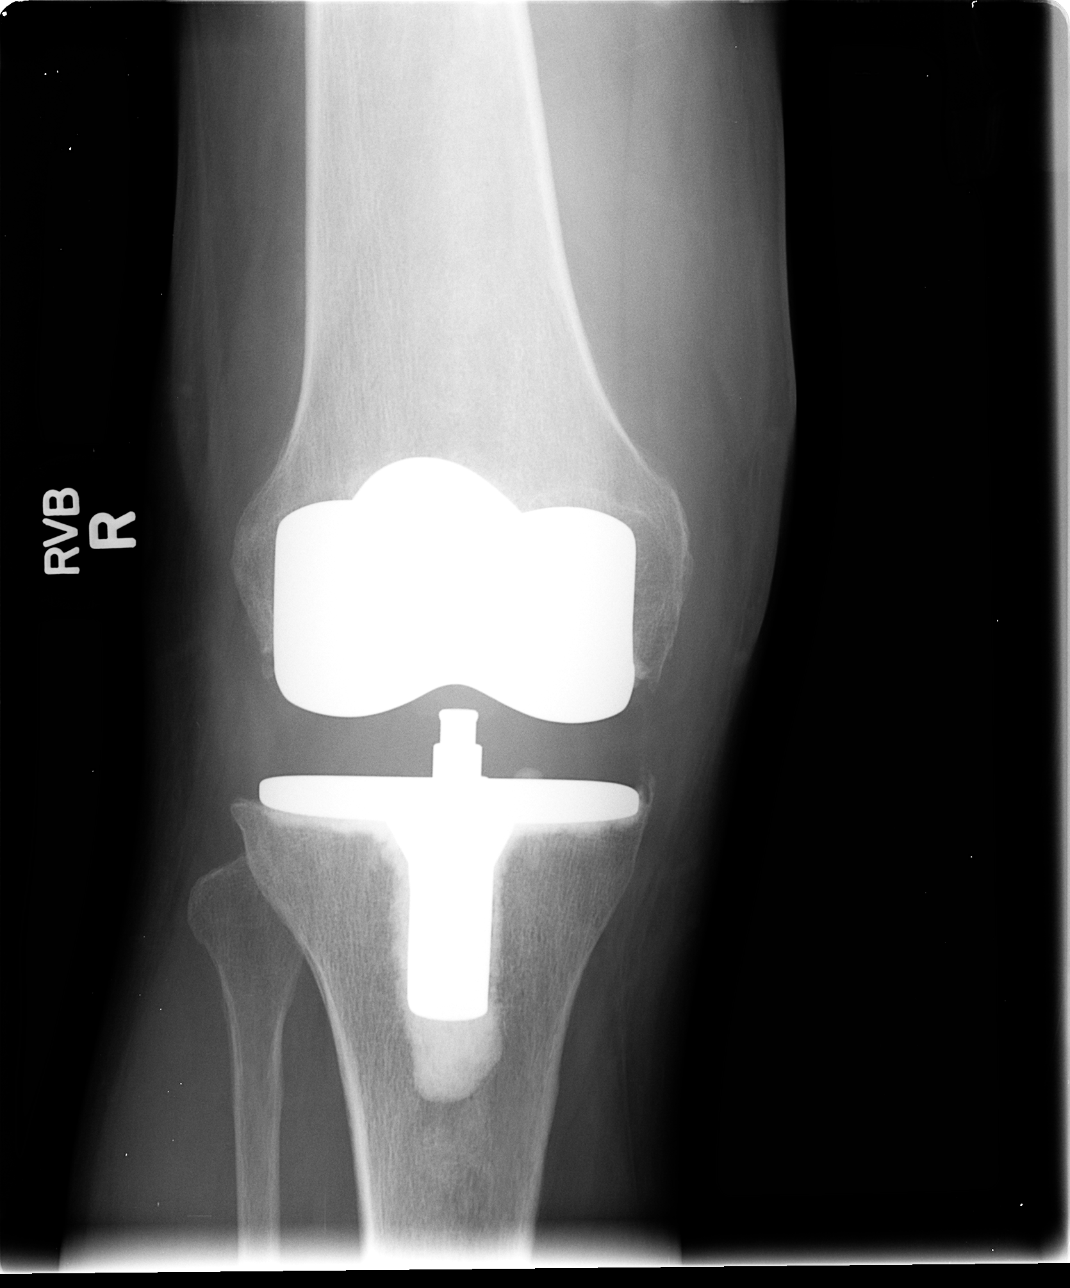

[view not recorded (2 of 4)]
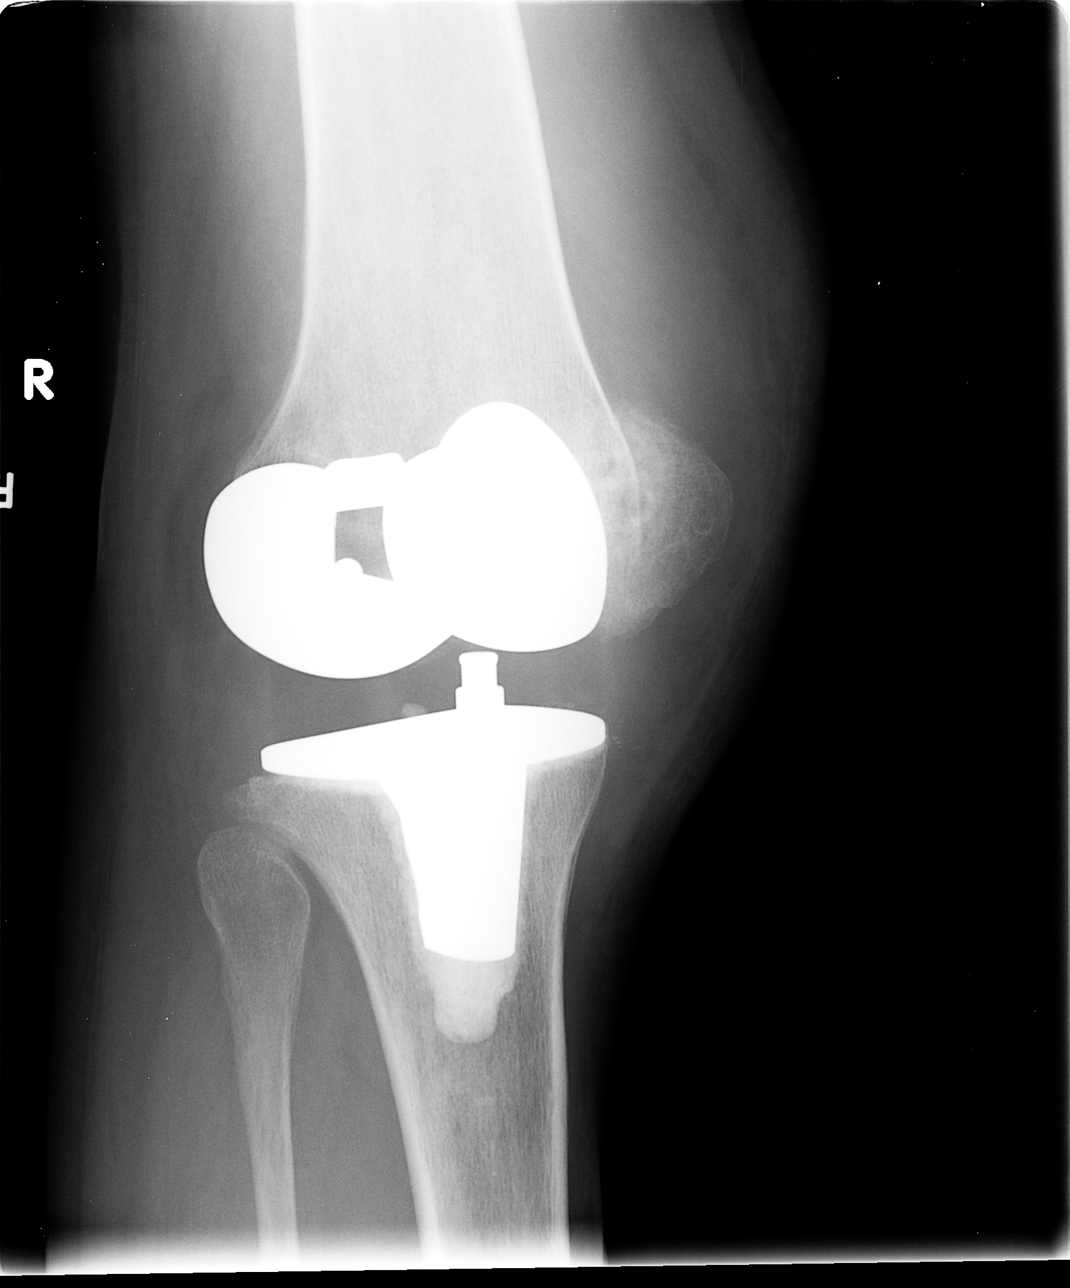

[view not recorded (3 of 4)]
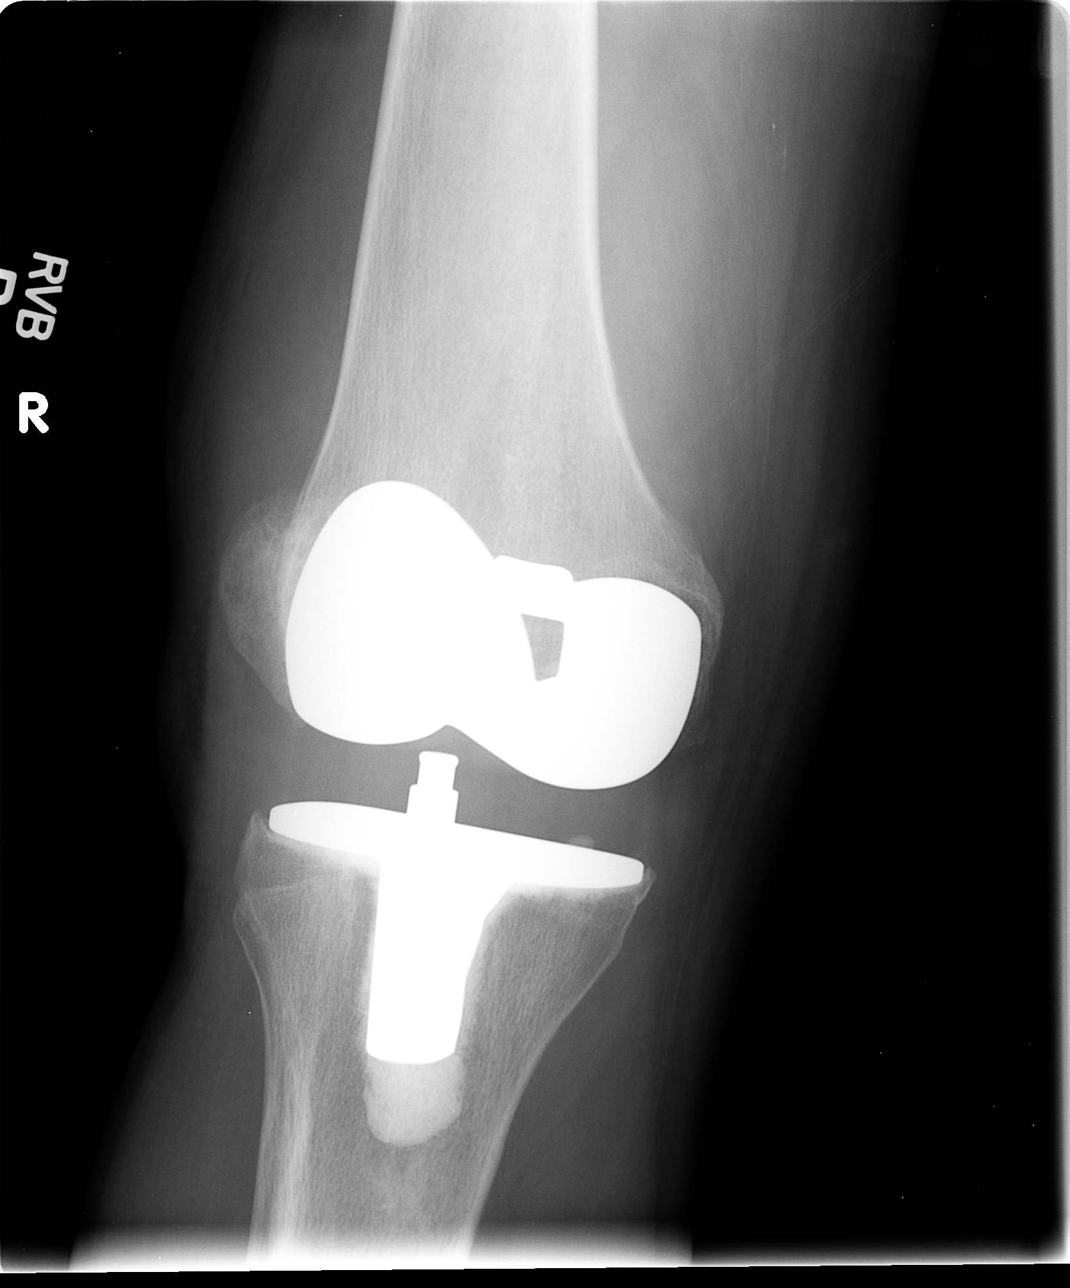

[view not recorded (4 of 4)]
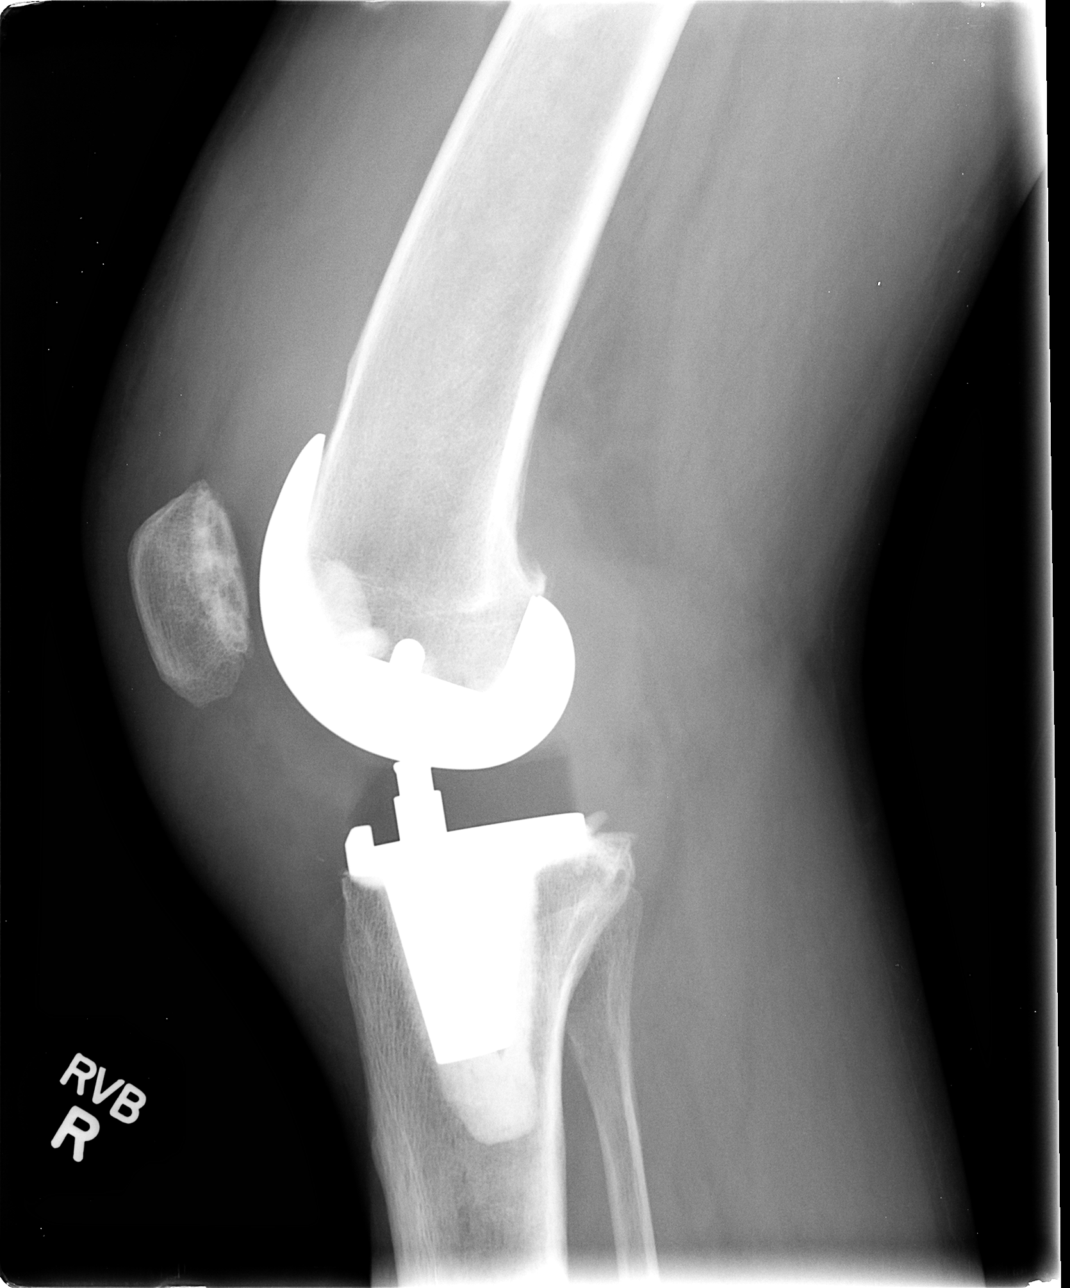

[4 of 4 positions shown; findings below may reference images not displayed]

FINDINGS: Knee arthroplasty shows stable and normal alignment.  No
evidence of fracture, dislocation or hardware failure.  No abnormal
lucency is seen surrounding hardware.  There remains evidence of a
probable underlying joint effusion.
IMPRESSION: Probable joint effusion.  Stable and normal appearance of right
knee arthroplasty.

## 2015-06-28 IMAGING — MR MR LUMBAR SPINE W/O CM
4 of 5 series · 29 of 48 positions shown · non-contrast
Comparison: CT 11/07/2009

CLINICAL DATA: Low back pain and thigh pain for 3 months duration.

EXAM:
MRI LUMBAR SPINE WITHOUT CONTRAST
TECHNIQUE: Multiplanar, multisequence MR imaging was performed. No intravenous
contrast was administered.

[Series 2: T2 · sagittal · 4.0mm · 0.44mm/px · 6 of 14 slices shown (1 of 2)]
[im 1/14]
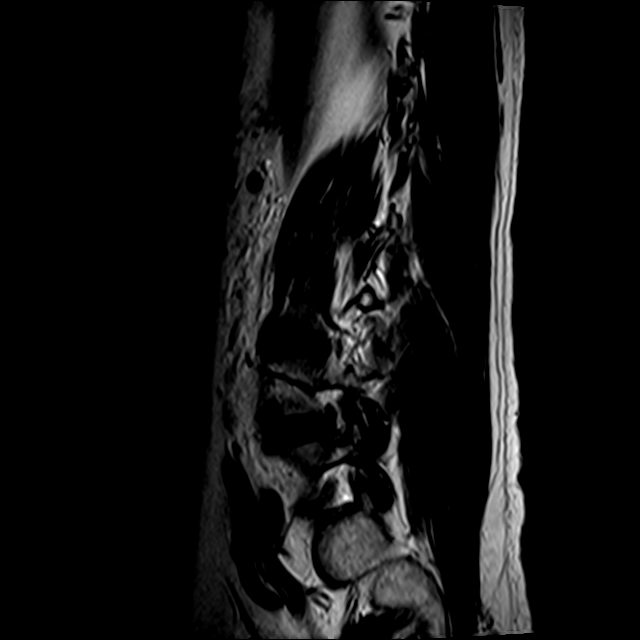
[im 3/14]
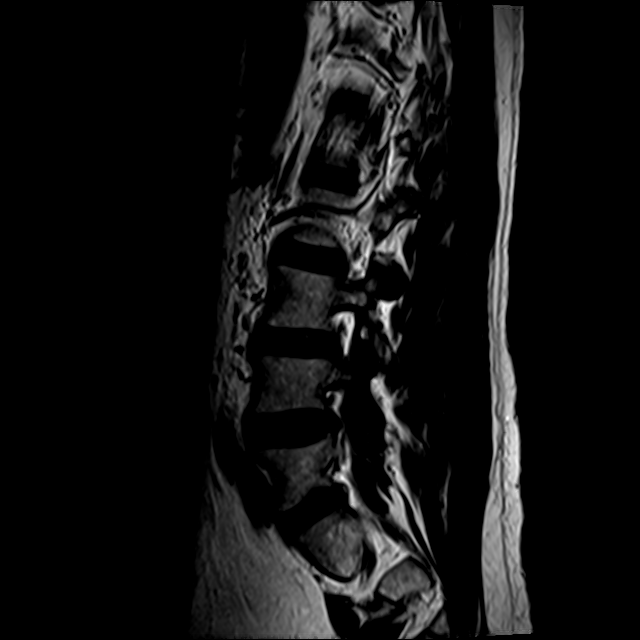
[im 6/14]
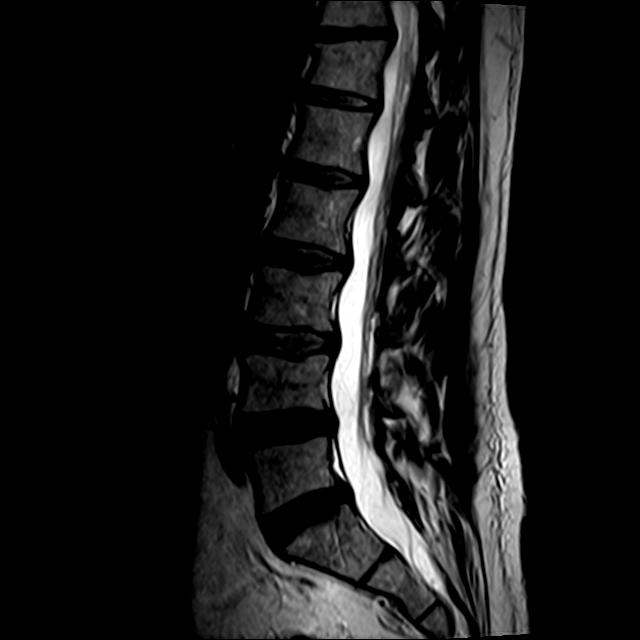
[im 8/14]
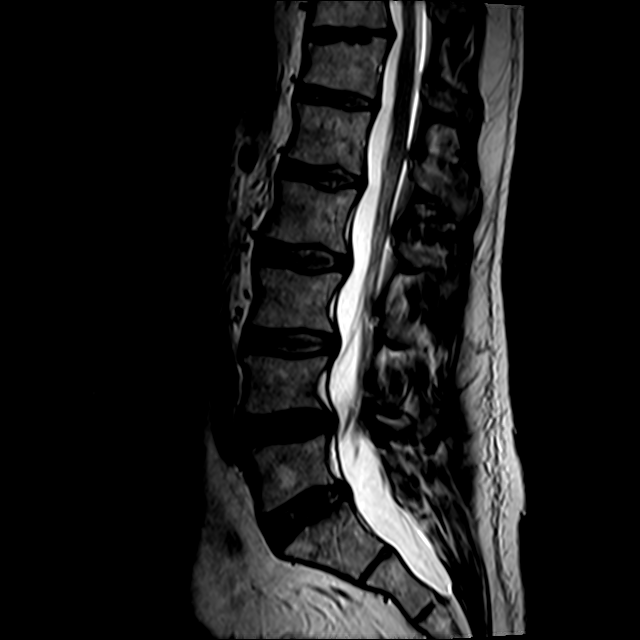
[im 11/14]
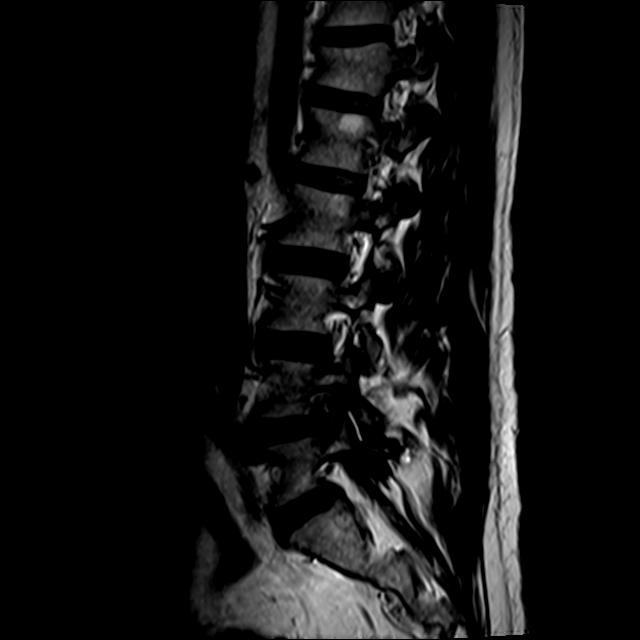
[im 14/14]
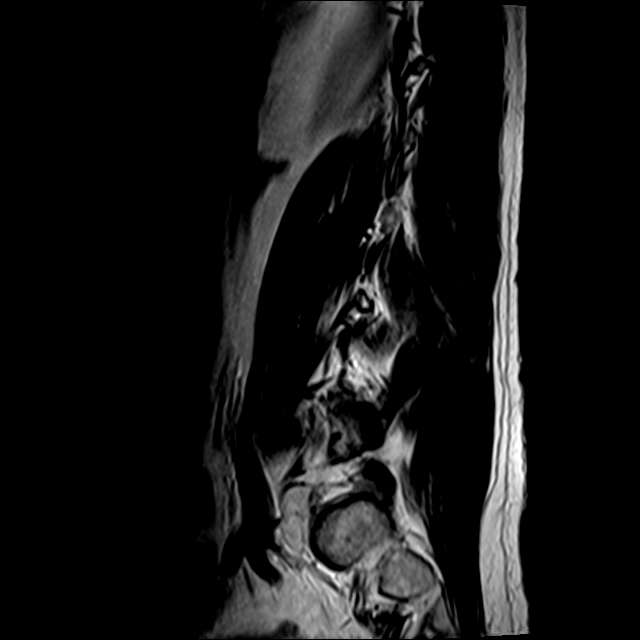

[Series 3: T1 · sagittal · 4.0mm · 0.55mm/px · 5 of 14 slices shown (1 of 2)]
[im 1/14]
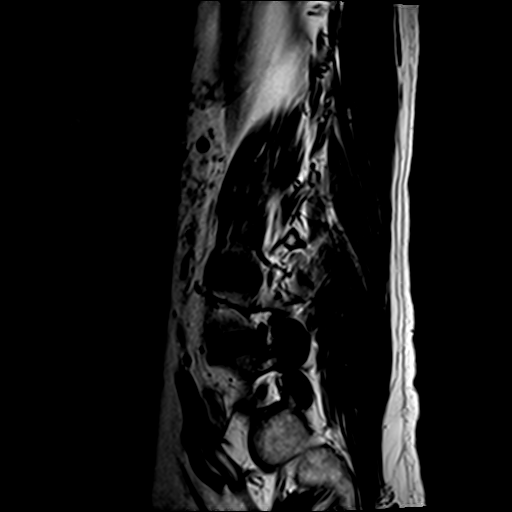
[im 4/14]
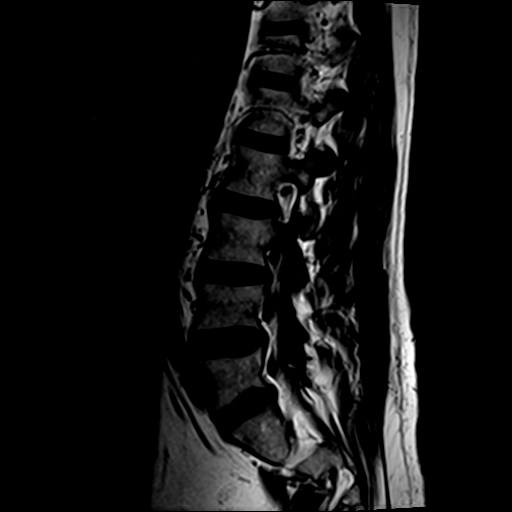
[im 7/14]
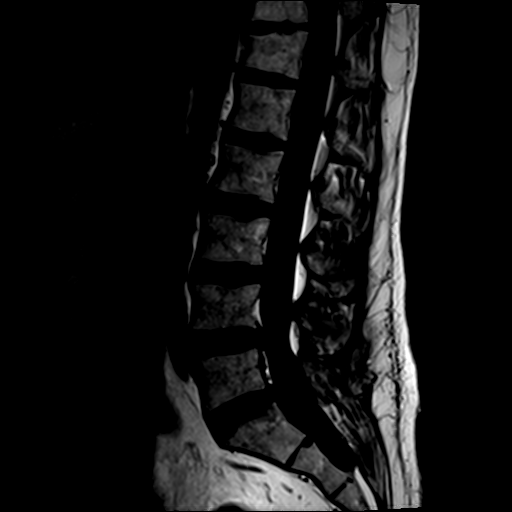
[im 10/14]
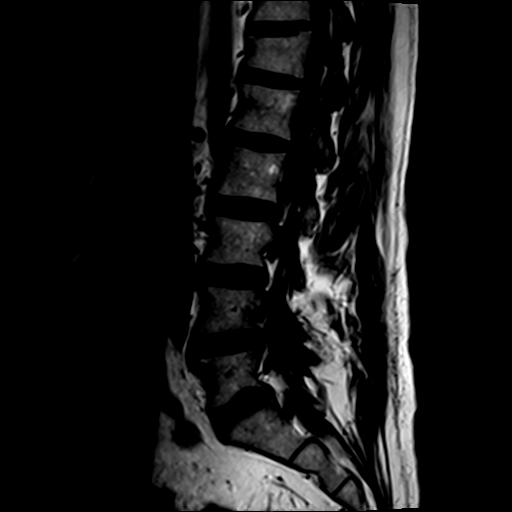
[im 14/14]
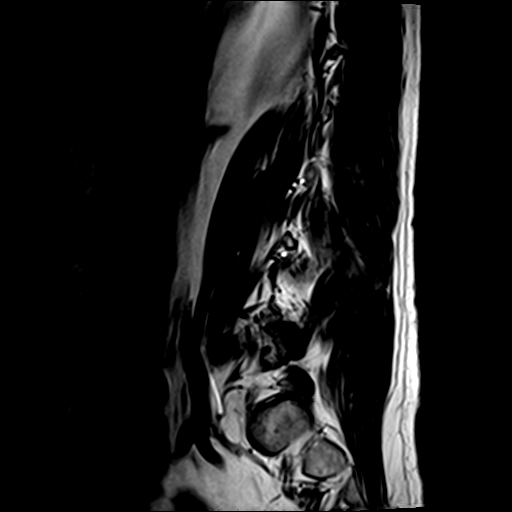

[Series 4: T2 · axial · 4.0mm · 0.70mm/px · z∈[-76,+147]mm · 10 of 41 slices shown (2 of 2)]
[im 3/41]
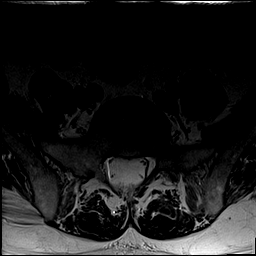
[im 6/41]
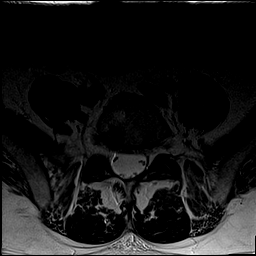
[im 9/41]
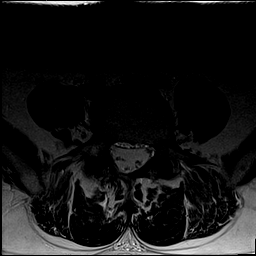
[im 14/41]
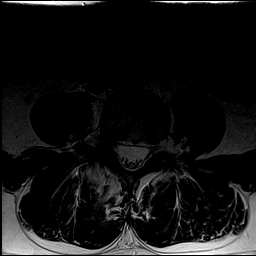
[im 19/41]
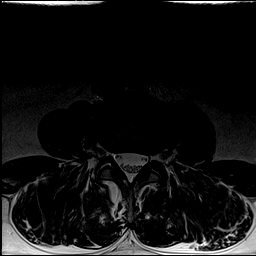
[im 22/41]
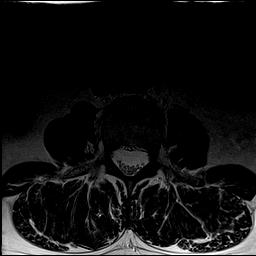
[im 25/41]
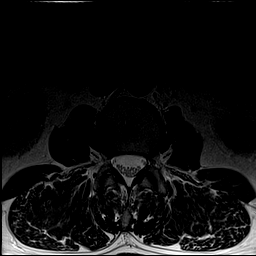
[im 30/41]
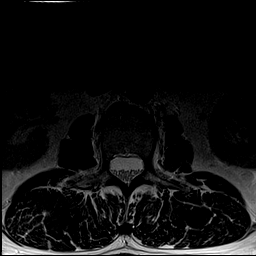
[im 35/41]
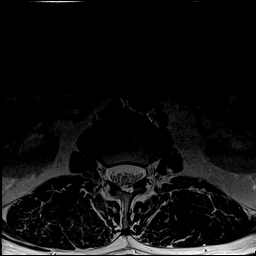
[im 41/41]
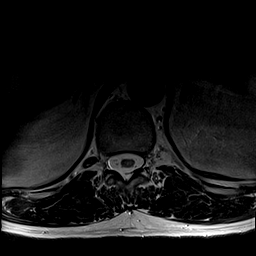

[Series 6: T1 · axial · 4.0mm · 0.70mm/px · z∈[-76,+86]mm · 8 of 41 slices shown (2 of 2)]
[im 3/41]
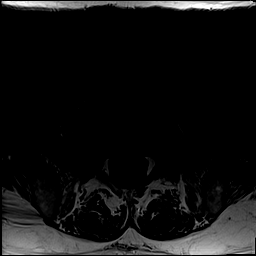
[im 6/41]
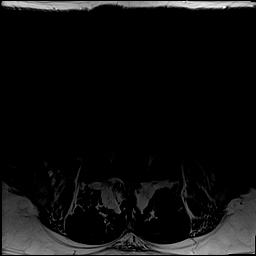
[im 9/41]
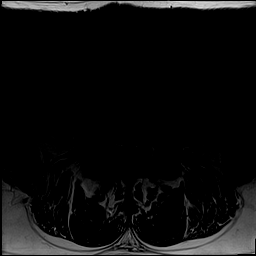
[im 14/41]
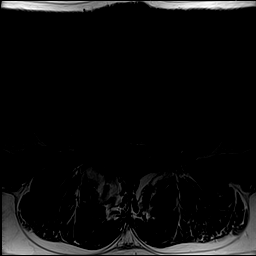
[im 19/41]
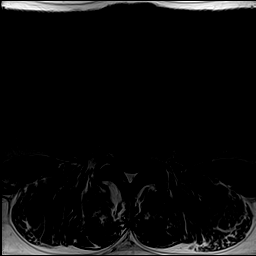
[im 22/41]
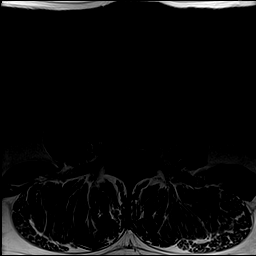
[im 25/41]
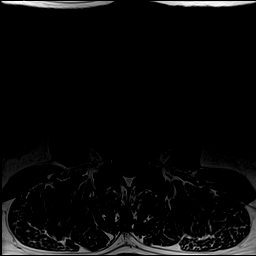
[im 35/41]
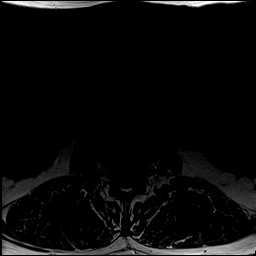

[29 of 48 positions shown; findings below may reference images not displayed]

FINDINGS: T11-12: Bulging of the disc. No stenosis. There is an old minor
compression deformity of T12 with loss of height of 10%.

T12-L1: Minimal bulging of the disc. No stenosis. Conus tip at lower
L1.

L1-2:  Minimal bulging of the disc.  No stenosis.

L2-3:  Minimal bulging of the disc.  No stenosis.

L3-4: Minimal bulging of the disc. Mild facet degeneration. No
stenosis.

L4-5: Circumferential bulging of the disc. Bilateral facet
arthropathy with hypertrophy and edema. Neural foraminal stenosis
bilaterally right worse than left. The facet arthropathy could be a
cause of back pain. Foraminal narrowing could affect the exiting L4
nerve roots.

L5-S1: Mild bulging of the disc. Mild facet degeneration. No
stenosis.
IMPRESSION: The dominant findings are at L4-5. There is advanced bilateral facet
arthropathy with hypertrophic degenerative disease of the joints and
edema. The disc shows circumferential bulging at this level. The
facet arthropathy could be a cause of low back pain. Foraminal
narrowing could affect either exiting L4 nerve root. This is more
pronounced on the right.

L5-S1:  Disc bulge.  Mild facet degeneration.  No stenosis.

L3-4:  Disc bulge.  Mild facet degeneration.  No stenosis.

Old minor healed compression deformity at T12.

## 2015-09-20 ENCOUNTER — Emergency Department (HOSPITAL_COMMUNITY)
Admission: EM | Admit: 2015-09-20 | Discharge: 2015-09-20 | Disposition: A | Discharge status: Home / Self Care | Payer: Medicare Other | Attending: Emergency Medicine | Admitting: Emergency Medicine

## 2015-09-20 ENCOUNTER — Encounter (HOSPITAL_COMMUNITY): Payer: Self-pay

## 2015-09-20 DIAGNOSIS — Z79899 Other long term (current) drug therapy: Secondary | ICD-10-CM | POA: Diagnosis not present

## 2015-09-20 DIAGNOSIS — K0889 Other specified disorders of teeth and supporting structures: Secondary | ICD-10-CM | POA: Diagnosis present

## 2015-09-20 DIAGNOSIS — F17210 Nicotine dependence, cigarettes, uncomplicated: Secondary | ICD-10-CM | POA: Diagnosis not present

## 2015-09-20 DIAGNOSIS — Z791 Long term (current) use of non-steroidal anti-inflammatories (NSAID): Secondary | ICD-10-CM | POA: Diagnosis not present

## 2015-09-20 DIAGNOSIS — Z7984 Long term (current) use of oral hypoglycemic drugs: Secondary | ICD-10-CM | POA: Diagnosis not present

## 2015-09-20 DIAGNOSIS — K029 Dental caries, unspecified: Secondary | ICD-10-CM | POA: Diagnosis not present

## 2015-09-20 DIAGNOSIS — E119 Type 2 diabetes mellitus without complications: Secondary | ICD-10-CM | POA: Diagnosis not present

## 2015-09-20 MED ORDER — MAGIC MOUTHWASH W/LIDOCAINE: 5.0000 mL | Freq: Three times a day (TID) | ORAL | 0 refills | Status: DC | PRN

## 2015-09-20 MED ORDER — HYDROCODONE-ACETAMINOPHEN 5-325 MG PO TABS
1.0000 | ORAL_TABLET | Freq: Once | ORAL | Status: AC
  Administered 2015-09-20: 1 via ORAL
  Filled 2015-09-20: qty 1

## 2015-09-20 MED ORDER — CLINDAMYCIN HCL 150 MG PO CAPS: 300.0000 mg | ORAL_CAPSULE | Freq: Four times a day (QID) | ORAL | 0 refills | Status: DC

## 2015-09-20 MED ORDER — ACETAMINOPHEN-CODEINE #3 300-30 MG PO TABS: 1.0000 | ORAL_TABLET | Freq: Four times a day (QID) | ORAL | 0 refills | Status: DC | PRN

## 2015-09-20 MED ORDER — CLINDAMYCIN HCL 150 MG PO CAPS
300.0000 mg | ORAL_CAPSULE | Freq: Once | ORAL | Status: AC
  Administered 2015-09-20: 300 mg via ORAL
  Filled 2015-09-20: qty 2

## 2015-09-20 NOTE — ED Notes (Signed)
Patient verbalizes understanding of discharge instructions, prescriptions, home care and follow up care. Patient out of department at this time. 

## 2015-09-20 NOTE — ED Triage Notes (Signed)
Left lower dental pain. States he had 5 teeth extracted Tuesday. States there is a piece of bone of tooth they may have missed.

## 2015-09-20 NOTE — ED Provider Notes (Signed)
[facility] DEPT Provider Note   CSN: [id number] Arrival date & time: 09/20/15  2112     History   Chief Complaint Chief Complaint  Patient presents with  . Dental Pain    HPI Sahid Milici is a 50 y.o. male.  HPI   Jullian Kazlauskas is a 50 y.o. male who presents to the Emergency Department complaining of left lower jaw pain and swelling for three days.  He states that he had 5 teeth extracted at a dental clinic on Tuesday and states that a portion of one of the teeth is still present.  He has contacted the clinic but cannot be rechecked until November.  He also complains of several ulcers along the inside of his mouth.  He denies pain relief with OTC medications.  He denies fever, facial swelling, neck pain, difficulty swallowing or breathing.    Past Medical History:  Diagnosis Date  . Anxiety   . Arthritis    "everywhere" (04/21/2012)  . Cellulitis and abscess of foot 12/19/2013   LEFT FOOT  . Chronic pain   . DDD (degenerative disc disease)   . Depression   . Diabetes mellitus without complication (HCC)    borderline  . Diabetic foot ulcer ([place])   . ETOH abuse   . GERD (gastroesophageal reflux disease)    tums  . History of blood transfusion    "related to left knee OR; probably right hip too" (04/21/2012)  . Mental disorder   . Neuromuscular disorder (HCC)    neuropathy  . Noncompliance   . Open wound    bottom of foot  . Pneumonia ~ 2012  . Polysubstance abuse    etoh, cocaine, heroin  . Stroke [facility]) 2008   "they said I might have had one during right hip replacement" (04/21/2012)    Patient Active Problem List   Diagnosis Date Noted  . Tobacco use disorder 10/25/2014  . Diabetes mellitus type 2 in nonobese (HCC)   . Swelling   . Type 2 diabetes mellitus with left diabetic foot ulcer ([place]) 12/18/2013  . Diabetes mellitus due to underlying condition with foot ulcer ([place]) 12/18/2013  . Left leg cellulitis 12/18/2013  . Cellulitis of right lower extremity  07/19/2013  . Cocaine abuse 03/29/2012  . Generalized anxiety disorder 03/29/2012  . Panic attacks 03/29/2012  . Alcohol dependence ([place]) 08/05/2011    Class: Chronic  . Substance abuse 05/31/2011  . Foot ulcer, left ([place]) 02/15/2011  . Fasting hyperglycemia 02/15/2011  . Neuropathic ulcer of foot ([place]) 02/15/2011    Past Surgical History:  Procedure Laterality Date  . JOINT REPLACEMENT    . KNEE ARTHROSCOPY Bilateral 1980's/1990's  . LUNG LOBECTOMY Left ~ 2006  . LUNG LOBECTOMY    . METATARSAL OSTEOTOMY  10/29/2011   Procedure: METATARSAL OSTEOTOMY;  Surgeon: [name], MD;  Location: [facility];  Service: Orthopedics;  Laterality: Left;  Left 1st Metatarsal Dorsal Closing Wedge   . REVISION TOTAL HIP ARTHROPLASTY Right 2008   "4-5 months after replacement" (04/21/2012)  . TOTAL HIP ARTHROPLASTY Right 2008  . TOTAL KNEE ARTHROPLASTY Left 2006  . TOTAL KNEE ARTHROPLASTY Right 04/20/2012  . TOTAL KNEE ARTHROPLASTY Right 04/20/2012   Procedure: TOTAL KNEE ARTHROPLASTY;  Surgeon: [name], MD;  Location: [facility];  Service: Orthopedics;  Laterality: Right;  Right Total Knee Arthroplasty       Home Medications    Prior to Admission medications   Medication Sig Start Date End Date Taking? Authorizing Provider  clonazePAM (KLONOPIN) 1 MG tablet Take 1 mg by mouth 4 (four) times daily.   Yes Historical Provider, MD  escitalopram (LEXAPRO) 20 MG tablet Take 1 tablet (20 mg total) by mouth every morning. For depression 03/29/13  Yes Encarnacion Slates, NP  gabapentin (NEURONTIN) 400 MG capsule Take 1 capsule (400 mg total) by mouth 4 (four) times daily. For substance withdrawal syndrome 03/29/13  Yes Encarnacion Slates, NP  ibuprofen (ADVIL,MOTRIN) 200 MG tablet Take 800 mg by mouth every 6 (six) hours as needed for mild pain or moderate pain.   Yes Historical Provider, MD  metFORMIN (GLUCOPHAGE) 500 MG tablet Take 1 tablet (500 mg total) by mouth 2 (two) times daily with a meal. 01/15/14  Yes Philemon Kingdom, MD  mirtazapine (REMERON) 30 MG tablet Take 30 mg by mouth at bedtime.   Yes Historical Provider, MD  QUEtiapine (SEROQUEL) 200 MG tablet Take 1 tablet (200 mg total) by mouth at bedtime. For mood control 03/29/13  Yes Encarnacion Slates, NP    Family History Family History  Problem Relation Age of Onset  . Diabetes Father     Social History Social History  Substance Use Topics  . Smoking status: Current Every Day Smoker    Packs/day: 1.00    Years: 30.00    Types: Cigarettes  . Smokeless tobacco: Never Used     Comment: 04/21/2012 offered smoking cessation materials; pt declines  12/19/2013 DECLINES SMOKING CESSATION MATERIALS  . Alcohol use No     Comment: Quit 09/20/14     Allergies   Benadryl [diphenhydramine hcl] and Trazodone and nefazodone   Review of Systems Review of Systems  Constitutional: Negative for appetite change and fever.  HENT: Positive for dental problem. Negative for congestion, facial swelling, sore throat and trouble swallowing.   Eyes: Negative for pain and visual disturbance.  Musculoskeletal: Negative for neck pain and neck stiffness.  Neurological: Negative for dizziness, facial asymmetry and headaches.  Hematological: Negative for adenopathy.  All other systems reviewed and are negative.    Physical Exam Updated Vital Signs BP 150/84 (BP Location: Left Arm)   Pulse 80   Temp 98.1 F (36.7 C) (Oral)   Resp 16   Ht 6\' 2"  (1.88 m)   Wt 99.8 kg   SpO2 96%   BMI 28.25 kg/m   Physical Exam  Constitutional: He is oriented to person, place, and time. He appears well-developed and well-nourished. No distress.  HENT:  Head: Normocephalic and atraumatic.  Right Ear: Tympanic membrane and ear canal normal.  Left Ear: Tympanic membrane and ear canal normal.  Mouth/Throat: Uvula is midline, oropharynx is clear and moist and mucous membranes are normal. No trismus in the jaw. Dental caries present. No dental abscesses or uvula swelling.  Post  dental extractions of the left lower premolars and molars with small dental fragment of the first premolar. Erythema and edema of the surrounding gingiva.  Two ulcerations of the bucca mucosa.  No facial swelling, obvious dental abscess, trismus, or sublingual abnml.    Neck: Normal range of motion. Neck supple.  Cardiovascular: Normal rate and regular rhythm.   No murmur heard. Pulmonary/Chest: Effort normal and breath sounds normal.  Musculoskeletal: Normal range of motion.  Lymphadenopathy:    He has no cervical adenopathy.  Neurological: He is alert and oriented to person, place, and time. He exhibits normal muscle tone. Coordination normal.  Skin: Skin is warm and dry.  Nursing note and vitals reviewed.  ED Treatments / Results  Labs (all labs ordered are listed, but only abnormal results are displayed) Labs Reviewed - No data to display  EKG  EKG Interpretation None       Radiology No results found.  Procedures Procedures (including critical care time)  Medications Ordered in ED Medications  clindamycin (CLEOCIN) capsule 300 mg (not administered)  HYDROcodone-acetaminophen (NORCO/VICODIN) 5-325 MG per tablet 1 tablet (not administered)     Initial Impression / Assessment and Plan / ED Course  I have reviewed the triage vital signs and the nursing notes.  Pertinent labs & imaging results that were available during my care of the patient were reviewed by me and considered in my medical decision making (see chart for details).  Clinical Course    Pt well appearing.  Recent dental extractions with retained dental fragment of the left lower first premolar.  No dry socket.  Has dental f/u in November  No concerning sx's for Ludwig's angina.    Final Clinical Impressions(s) / ED Diagnoses   Final diagnoses:  Pain, dental    New Prescriptions New Prescriptions   No medications on file     Bufford Lope 09/20/15 2201    Dorie Rank, MD 09/21/15  1143

## 2015-09-20 NOTE — Discharge Instructions (Signed)
Follow up with your dentist.

## 2015-09-21 NOTE — ED Notes (Signed)
Healther from [facility] called requesting what medication to mix in the magic mouthwash prescribed to patient because pt has allergy to benadryl.  Notified Evalee Jefferson PA and was instructed to tell pharmacist to mix equal parts of lidocaine, nystain, and maalox.

## 2015-11-03 ENCOUNTER — Emergency Department (HOSPITAL_COMMUNITY): Payer: Medicare Other

## 2015-11-03 ENCOUNTER — Encounter (HOSPITAL_COMMUNITY): Payer: Self-pay | Admitting: *Deleted

## 2015-11-03 ENCOUNTER — Emergency Department (HOSPITAL_COMMUNITY)
Admission: EM | Admit: 2015-11-03 | Discharge: 2015-11-03 | Disposition: A | Discharge status: Home / Self Care | Payer: Medicare Other | Attending: Emergency Medicine | Admitting: Emergency Medicine

## 2015-11-03 DIAGNOSIS — W11XXXA Fall on and from ladder, initial encounter: Secondary | ICD-10-CM | POA: Insufficient documentation

## 2015-11-03 DIAGNOSIS — Z79899 Other long term (current) drug therapy: Secondary | ICD-10-CM | POA: Insufficient documentation

## 2015-11-03 DIAGNOSIS — Y999 Unspecified external cause status: Secondary | ICD-10-CM | POA: Insufficient documentation

## 2015-11-03 DIAGNOSIS — R51 Headache: Secondary | ICD-10-CM | POA: Insufficient documentation

## 2015-11-03 DIAGNOSIS — Z7984 Long term (current) use of oral hypoglycemic drugs: Secondary | ICD-10-CM | POA: Insufficient documentation

## 2015-11-03 DIAGNOSIS — Y9389 Activity, other specified: Secondary | ICD-10-CM | POA: Diagnosis not present

## 2015-11-03 DIAGNOSIS — F17210 Nicotine dependence, cigarettes, uncomplicated: Secondary | ICD-10-CM | POA: Diagnosis not present

## 2015-11-03 DIAGNOSIS — E119 Type 2 diabetes mellitus without complications: Secondary | ICD-10-CM | POA: Insufficient documentation

## 2015-11-03 DIAGNOSIS — Z8673 Personal history of transient ischemic attack (TIA), and cerebral infarction without residual deficits: Secondary | ICD-10-CM | POA: Diagnosis not present

## 2015-11-03 DIAGNOSIS — R1011 Right upper quadrant pain: Secondary | ICD-10-CM | POA: Insufficient documentation

## 2015-11-03 DIAGNOSIS — W19XXXA Unspecified fall, initial encounter: Secondary | ICD-10-CM

## 2015-11-03 DIAGNOSIS — M25511 Pain in right shoulder: Secondary | ICD-10-CM | POA: Diagnosis present

## 2015-11-03 DIAGNOSIS — R0789 Other chest pain: Secondary | ICD-10-CM

## 2015-11-03 DIAGNOSIS — Y9289 Other specified places as the place of occurrence of the external cause: Secondary | ICD-10-CM | POA: Diagnosis not present

## 2015-11-03 LAB — I-STAT CHEM 8, ED
BUN: 16 mg/dL (ref 6–20)
CHLORIDE: 101 mmol/L (ref 101–111)
Calcium, Ion: 1.15 mmol/L (ref 1.15–1.40)
Creatinine, Ser: 1.1 mg/dL (ref 0.61–1.24)
Glucose, Bld: 285 mg/dL — ABNORMAL HIGH (ref 65–99)
HEMATOCRIT: 44 % (ref 39.0–52.0)
Hemoglobin: 15 g/dL (ref 13.0–17.0)
Potassium: 3.6 mmol/L (ref 3.5–5.1)
SODIUM: 138 mmol/L (ref 135–145)
TCO2: 27 mmol/L (ref 0–100)

## 2015-11-03 MED ORDER — MORPHINE SULFATE (PF) 4 MG/ML IV SOLN
4.0000 mg | INTRAVENOUS | Status: AC | PRN
Start: 2015-11-03 — End: 2015-11-03
  Administered 2015-11-03 (×2): 4 mg via INTRAVENOUS
  Filled 2015-11-03 (×2): qty 1

## 2015-11-03 MED ORDER — HYDROCODONE-ACETAMINOPHEN 5-325 MG PO TABS
2.0000 | ORAL_TABLET | Freq: Once | ORAL | Status: AC
  Administered 2015-11-03: 2 via ORAL
  Filled 2015-11-03: qty 2

## 2015-11-03 MED ORDER — METHOCARBAMOL 500 MG PO TABS: 1000.0000 mg | ORAL_TABLET | Freq: Four times a day (QID) | ORAL | 0 refills | Status: DC | PRN

## 2015-11-03 MED ORDER — HYDROCODONE-ACETAMINOPHEN 5-325 MG PO TABS: ORAL_TABLET | ORAL | 0 refills | Status: DC

## 2015-11-03 MED ORDER — IOPAMIDOL (ISOVUE-300) INJECTION 61%
100.0000 mL | Freq: Once | INTRAVENOUS | Status: AC | PRN
  Administered 2015-11-03: 100 mL via INTRAVENOUS

## 2015-11-03 NOTE — Discharge Instructions (Signed)
Take the prescriptions as directed.  Apply moist heat or ice to the area(s) of discomfort, for 15 minutes at a time, several times per day for the next few days.  Do not fall asleep on a heating or ice pack. Wear the sling for comfort for the next several days, then remove and gently use your right shoulder until you are seen by the Orthopedic doctor. Call your regular medical doctor and the Orthopedist on Monday to schedule a follow up appointment this week.  Return to the Emergency Department immediately if worsening.

## 2015-11-03 NOTE — ED Triage Notes (Signed)
Pt was on a ladder about 4 feet up when he fell of the ladder. He fell onto his right side. He is having pain in his right shoulder and neck. Pt states he had a knot pop up on his right rib area, he verbalizes he pushed this area back in. Pt alert and oriented at this time.

## 2015-11-03 NOTE — ED Notes (Signed)
c-colar applied in triage.

## 2015-11-03 NOTE — ED Provider Notes (Signed)
[facility] DEPT Provider Note   CSN: [id number] Arrival date & time: 11/03/15  1647     History   Chief Complaint Chief Complaint  Patient presents with  . Fall    HPI [name] is a 49 y.o. male.  HPI Pt was seen at 1800. Per pt, c/o sudden onset and resolution of one episode of fall that occurred PTA. Pt states he was on the 4th rung of his ladder when he fell, landing on his right side. Pt c/o right neck, shoulder, ribs and abd pain. Pt was ambulatory after the fall. Denies LOC, no AMS, no palpitations, no SOB, no N/V/D, no low back pain, no focal motor weakness, no tingling/numbness in extremities.     Past Medical History:  Diagnosis Date  . Anxiety   . Arthritis    "everywhere" (04/21/2012)  . Cellulitis and abscess of foot 12/19/2013   LEFT FOOT  . Chronic pain   . DDD (degenerative disc disease)   . Depression   . Diabetes mellitus without complication (HCC)    borderline  . Diabetic foot ulcer ([place])   . ETOH abuse   . GERD (gastroesophageal reflux disease)    tums  . History of blood transfusion    "related to left knee OR; probably right hip too" (04/21/2012)  . Mental disorder   . Neuromuscular disorder (HCC)    neuropathy  . Noncompliance   . Open wound    bottom of foot  . Pneumonia ~ 2012  . Polysubstance abuse    etoh, cocaine, heroin  . Stroke [facility]) 2008   "they said I might have had one during right hip replacement" (04/21/2012)    Patient Active Problem List   Diagnosis Date Noted  . Tobacco use disorder 10/25/2014  . Diabetes mellitus type 2 in nonobese (HCC)   . Swelling   . Type 2 diabetes mellitus with left diabetic foot ulcer ([place]) 12/18/2013  . Diabetes mellitus due to underlying condition with foot ulcer ([place]) 12/18/2013  . Left leg cellulitis 12/18/2013  . Cellulitis of right lower extremity 07/19/2013  . Cocaine abuse 03/29/2012  . Generalized anxiety disorder 03/29/2012  . Panic attacks 03/29/2012  . Alcohol dependence  ([place]) 08/05/2011    Class: Chronic  . Substance abuse 05/31/2011  . Foot ulcer, left ([place]) 02/15/2011  . Fasting hyperglycemia 02/15/2011  . Neuropathic ulcer of foot ([place]) 02/15/2011    Past Surgical History:  Procedure Laterality Date  . JOINT REPLACEMENT    . KNEE ARTHROSCOPY Bilateral 1980's/1990's  . LUNG LOBECTOMY Left ~ 2006  . LUNG LOBECTOMY    . METATARSAL OSTEOTOMY  10/29/2011   Procedure: METATARSAL OSTEOTOMY;  Surgeon: [name], MD;  Location: [place];  Service: Orthopedics;  Laterality: Left;  Left 1st Metatarsal Dorsal Closing Wedge   . REVISION TOTAL HIP ARTHROPLASTY Right 2008   "4-5 months after replacement" (04/21/2012)  . TOTAL HIP ARTHROPLASTY Right 2008  . TOTAL KNEE ARTHROPLASTY Left 2006  . TOTAL KNEE ARTHROPLASTY Right 04/20/2012  . TOTAL KNEE ARTHROPLASTY Right 04/20/2012   Procedure: TOTAL KNEE ARTHROPLASTY;  Surgeon: [name], MD;  Location: [facility] [place];  Service: Orthopedics;  Laterality: Right;  Right Total Knee Arthroplasty       Home Medications    Prior to Admission medications   Medication Sig Start Date End Date Taking? Authorizing Provider  clonazePAM (KLONOPIN) 1 MG tablet Take 1 mg by mouth 4 (four) times daily.   Yes Historical Provider, MD  escitalopram (  LEXAPRO) 20 MG tablet Take 1 tablet (20 mg total) by mouth every morning. For depression Patient taking differently: Take 40 mg by mouth every morning. For depression 03/29/13  Yes Encarnacion Slates, NP  gabapentin (NEURONTIN) 400 MG capsule Take 1 capsule (400 mg total) by mouth 4 (four) times daily. For substance withdrawal syndrome 03/29/13  Yes Encarnacion Slates, NP  ibuprofen (ADVIL,MOTRIN) 200 MG tablet Take 800 mg by mouth every 6 (six) hours as needed for mild pain or moderate pain.   Yes Historical Provider, MD  magic mouthwash w/lidocaine SOLN Take 5 mLs by mouth 3 (three) times daily as needed for mouth pain. Swish and spit, do not swallow 09/20/15  Yes Tammy Triplett, PA-C  metFORMIN  (GLUCOPHAGE) 500 MG tablet Take 1 tablet (500 mg total) by mouth 2 (two) times daily with a meal. 01/15/14  Yes Philemon Kingdom, MD  mirtazapine (REMERON) 30 MG tablet Take 30 mg by mouth at bedtime.   Yes Historical Provider, MD  QUEtiapine (SEROQUEL) 200 MG tablet Take 1 tablet (200 mg total) by mouth at bedtime. For mood control 03/29/13  Yes Encarnacion Slates, NP  acetaminophen-codeine (TYLENOL #3) 300-30 MG tablet Take 1-2 tablets by mouth every 6 (six) hours as needed for moderate pain. Patient not taking: Reported on 11/03/2015 09/20/15   Tammy Triplett, PA-C  clindamycin (CLEOCIN) 150 MG capsule Take 2 capsules (300 mg total) by mouth 4 (four) times daily. For 7 days Patient not taking: Reported on 11/03/2015 09/20/15   Kem Parkinson, PA-C    Family History Family History  Problem Relation Age of Onset  . Diabetes Father     Social History Social History  Substance Use Topics  . Smoking status: Current Every Day Smoker    Packs/day: 1.00    Years: 30.00    Types: Cigarettes  . Smokeless tobacco: Never Used     Comment: 04/21/2012 offered smoking cessation materials; pt declines  12/19/2013 DECLINES SMOKING CESSATION MATERIALS  . Alcohol use No     Comment: Quit 09/20/14     Allergies   Benadryl [diphenhydramine hcl] and Trazodone and nefazodone   Review of Systems Review of Systems ROS: Statement: All systems negative except as marked or noted in the HPI; Constitutional: Negative for fever and chills. ; ; Eyes: Negative for eye pain, redness and discharge. ; ; ENMT: Negative for ear pain, hoarseness, nasal congestion, sinus pressure and sore throat. ; ; Cardiovascular: Negative for palpitations, diaphoresis, dyspnea and peripheral edema. ; ; Respiratory: Negative for cough, wheezing and stridor. ; ; Gastrointestinal: +abd pain. Negative for nausea, vomiting, diarrhea, blood in stool, hematemesis, jaundice and rectal bleeding. . ; ; Genitourinary: Negative for dysuria, flank pain and  hematuria. ; ; Musculoskeletal: +neck pain, shoulder pain, chest pain. Negative for back pain. Negative for swelling and deformity.; ; Skin: Negative for pruritus, rash, abrasions, blisters, bruising and skin lesion.; ; Neuro: Negative for headache, lightheadedness and neck stiffness. Negative for weakness, altered level of consciousness, altered mental status, extremity weakness, paresthesias, involuntary movement, seizure and syncope.       Physical Exam Updated Vital Signs BP 130/77   Pulse 106   Temp 98.2 F (36.8 C) (Temporal)   Resp 20   Ht 6\' 3"  (1.905 m)   Wt 210 lb (95.3 kg)   SpO2 97%   BMI 26.25 kg/m   Physical Exam 1805: Physical examination: Vital signs and O2 SAT: Reviewed; Constitutional: Well developed, Well nourished, Well hydrated, In no acute distress;  Head and Face: Normocephalic, Atraumatic; Eyes: EOMI, PERRL, No scleral icterus; ENMT: Mouth and pharynx normal, Left TM normal, Right TM normal, Mucous membranes moist; Neck: Immobilized in C-collar, Trachea midline; Spine: +TTP right trapezius and cervical paraspinal muscles. No midline CS, TS, LS tenderness.; Cardiovascular: Regular rate and rhythm, No gallop; Respiratory: Breath sounds clear & equal bilaterally, No rales, rhonchi, wheezes, Normal respiratory effort/excursion; Chest: +right lower chest wall tender to palp.  No deformity, Movement normal, No crepitus, No abrasions or ecchymosis.; Abdomen: Soft, +RUQ tender to palp. No rebound or guarding. Nondistended, Normal bowel sounds, No abrasions or ecchymosis.; Genitourinary: No CVA tenderness;; Extremities: No deformity, Right shoulder w/decreased ROM due to pain. +generalized TTP to palp entire joint. Clavicle NT, scapula NT, proximal humerus NT, biceps tendon NT over bicipital groove.  Motor strength at shoulder normal.  Sensation intact over deltoid region, distal NMS intact with right hand having intact and equal sensation and strength in the distribution of the  median, radial, and ulnar nerve function compared to opposite side.  Strong radial pulse.  +FROM right elbow with intact motor strength biceps and triceps muscles to resistance.  Full range of motion major/large joints of bilat UE's and LE's without pain or tenderness to palp, Neurovascularly intact, Pulses normal, No edema, Pelvis stable; Neuro: AA&Ox3, GCS 15.  Major CN grossly intact. Speech clear. No gross focal motor or sensory deficits in extremities.; Skin: Color normal, Warm, Dry   ED Treatments / Results  Labs (all labs ordered are listed, but only abnormal results are displayed)   EKG  EKG Interpretation None       Radiology   Procedures Procedures (including critical care time)  Medications Ordered in ED Medications  morphine 4 MG/ML injection 4 mg (4 mg Intravenous Given 11/03/15 1830)  iopamidol (ISOVUE-300) 61 % injection 100 mL (100 mLs Intravenous Contrast Given 11/03/15 1921)     Initial Impression / Assessment and Plan / ED Course  I have reviewed the triage vital signs and the nursing notes.  Pertinent labs & imaging results that were available during my care of the patient were reviewed by me and considered in my medical decision making (see chart for details).  MDM Reviewed: previous chart, nursing note and vitals Reviewed previous: labs Interpretation: labs, x-ray and CT scan   Results for orders placed or performed during the hospital encounter of 11/03/15  I-stat Chem 8, ED  Result Value Ref Range   Sodium 138 135 - 145 mmol/L   Potassium 3.6 3.5 - 5.1 mmol/L   Chloride 101 101 - 111 mmol/L   BUN 16 6 - 20 mg/dL   Creatinine, Ser 1.10 0.61 - 1.24 mg/dL   Glucose, Bld 285 (H) 65 - 99 mg/dL   Calcium, Ion 1.15 1.15 - 1.40 mmol/L   TCO2 27 0 - 100 mmol/L   Hemoglobin 15.0 13.0 - 17.0 g/dL   HCT 44.0 39.0 - 52.0 %   Dg Shoulder Right Result Date: 11/03/2015 CLINICAL DATA:  Status post fall.  Right shoulder pain. EXAM: RIGHT SHOULDER - 2+ VIEW  COMPARISON:  None. FINDINGS: There is no fracture or dislocation. There are mild degenerative changes of the acromioclavicular joint. IMPRESSION: No acute osseous injury of the right shoulder. Electronically Signed   By: Kathreen Devoid   On: 11/03/2015 17:31   Ct Head Wo Contrast Result Date: 11/03/2015 CLINICAL DATA:  Pain after fall from 4 foot ladder landing on the right. Right-sided neck pain. EXAM: CT HEAD WITHOUT CONTRAST CT  CERVICAL SPINE WITHOUT CONTRAST TECHNIQUE: Multidetector CT imaging of the head and cervical spine was performed following the standard protocol without intravenous contrast. Multiplanar CT image reconstructions of the cervical spine were also generated. COMPARISON:  02/15/2015 CT head, report of the cervical spine from 11/14/2010 FINDINGS: CT HEAD FINDINGS BRAIN: The ventricles and sulci are minimally prominent but unchanged from previous. Minimal chronic small vessel ischemic changes of periventricular white matter noted bilaterally. No intraparenchymal hemorrhage, mass effect nor midline shift. No acute large vascular territory infarcts. No abnormal extra-axial fluid collections. Basal cisterns are patent. VASCULAR: Unremarkable. SKULL/SOFT TISSUES: No skull fracture. No significant soft tissue swelling. ORBITS/SINUSES: The included ocular globes and orbital contents are normal.The mastoid air-cells and included paranasal sinuses are well-aerated. Polypoid 2.6 x 1.5 cm soft tissue density in the right maxillary sinus more commonly represents a mucous retention cyst. This is partially imaged. OTHER: None. CT CERVICAL SPINE FINDINGS ALIGNMENT: Vertebral bodies in alignment. Maintained lordosis. Osteoarthritic joint space narrowing and spurring about the atlantodental interval. Craniocervical relationship is maintained. SKULL BASE AND VERTEBRAE: Cervical vertebral bodies and posterior elements are intact. 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 No destructive bony lesions.  C1-2 articulation maintained. Chronic 3 mm of retrolisthesis of C3 on C4. SOFT TISSUES AND SPINAL CANAL: Normal. DISC LEVELS: C3-4 and C5-6 degenerative disc disease with posterior marginal osteophytes encroaching upon both neural foramina, moderate on the left at these levels and mild on the right. UPPER CHEST: Lung apices are clear. OTHER: None. IMPRESSION: Minimal chronic small vessel ischemic disease of periventricular white matter. No acute intracranial abnormality. No acute cervical spine abnormality. Degenerative disc disease C3-4 and C5-6 with bilateral neural foraminal encroachment from osteophytes. Chronic 3 mm retrolisthesis of C3 on C4 also reported in 2012 as 3 mm. 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   On: 11/03/2015 20:23   Ct Chest W Contrast Result Date: 11/03/2015 CLINICAL DATA:  Fall 4 feet onto right side.  Pain EXAM: CT CHEST, ABDOMEN, AND PELVIS WITH CONTRAST TECHNIQUE: Multidetector CT imaging of the chest, abdomen and pelvis was performed following the standard protocol during bolus administration of intravenous contrast. CONTRAST:  166mL ISOVUE-300 IOPAMIDOL (ISOVUE-300) INJECTION 61% COMPARISON:  CT abdomen pelvis 02/15/2015. FINDINGS: CT CHEST FINDINGS Cardiovascular: Thoracic aorta normal. Heart size normal. Normal enhancement of the pulmonary arteries. No pericardial effusion. Mediastinum/Nodes: Negative for mediastinal hematoma. No mass or adenopathy. Lungs/Pleura: 3 mm right upper lobe nodule. 5 mm nodule along the minor fissure most likely a lymph node. No lung mass. Linear scarring in the left lung base. No infiltrate or effusion. Musculoskeletal: Negative for thoracic spine fracture. No rib fracture identified. CT ABDOMEN PELVIS FINDINGS Hepatobiliary: 10 x 15 mm hypodensity in the dome of the liver posteriorly. Possible hemangioma or cyst. No other liver lesion. Gallbladder and bile ducts normal. No evidence of  liver injury. Pancreas: Negative Spleen: negative Adrenals/Urinary Tract: Symmetric excretion of contrast by both kidneys. No renal mass or injury. No hydronephrosis. No renal calculi. Urinary bladder normal. Stomach/Bowel: Stomach and duodenum normal. Negative for bowel obstruction. Large amount of stool throughout the colon. Normal appendix. No bowel edema. Vascular/Lymphatic: Mild atherosclerotic disease in the aorta. Negative for aneurysm. No adenopathy. Reproductive: Normal prostate. Other: No free fluid. No soft tissue contusion. Negative for hernia. Musculoskeletal: Negative for fracture. Right hip replacement in satisfactory alignment. IMPRESSION: No acute injury in the chest abdomen or pelvis. Electronically Signed   By: [name] Gallo  M.D.   On: 11/03/2015 20:20   Ct Cervical Spine Wo Contrast Result Date: 11/03/2015 CLINICAL DATA:  Pain after fall from 4 foot ladder landing on the right. Right-sided neck pain. EXAM: CT HEAD WITHOUT CONTRAST CT CERVICAL SPINE WITHOUT CONTRAST TECHNIQUE: Multidetector CT imaging of the head and cervical spine was performed following the standard protocol without intravenous contrast. Multiplanar CT image reconstructions of the cervical spine were also generated. COMPARISON:  02/15/2015 CT head, report of the cervical spine from 11/14/2010 FINDINGS: CT HEAD FINDINGS BRAIN: The ventricles and sulci are minimally prominent but unchanged from previous. Minimal chronic small vessel ischemic changes of periventricular white matter noted bilaterally. No intraparenchymal hemorrhage, mass effect nor midline shift. No acute large vascular territory infarcts. No abnormal extra-axial fluid collections. Basal cisterns are patent. VASCULAR: Unremarkable. SKULL/SOFT TISSUES: No skull fracture. No significant soft tissue swelling. ORBITS/SINUSES: The included ocular globes and orbital contents are normal.The mastoid air-cells and included paranasal sinuses are well-aerated. Polypoid  2.6 x 1.5 cm soft tissue density in the right maxillary sinus more commonly represents a mucous retention cyst. This is partially imaged. OTHER: None. CT CERVICAL SPINE FINDINGS ALIGNMENT: Vertebral bodies in alignment. Maintained lordosis. Osteoarthritic joint space narrowing and spurring about the atlantodental interval. Craniocervical relationship is maintained. SKULL BASE AND VERTEBRAE: Cervical vertebral bodies and posterior elements are intact. 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 No destructive bony lesions. C1-2 articulation maintained. Chronic 3 mm of retrolisthesis of C3 on C4. SOFT TISSUES AND SPINAL CANAL: Normal. DISC LEVELS: C3-4 and C5-6 degenerative disc disease with posterior marginal osteophytes encroaching upon both neural foramina, moderate on the left at these levels and mild on the right. UPPER CHEST: Lung apices are clear. OTHER: None. IMPRESSION: Minimal chronic small vessel ischemic disease of periventricular white matter. No acute intracranial abnormality. No acute cervical spine abnormality. Degenerative disc disease C3-4 and C5-6 with bilateral neural foraminal encroachment from osteophytes. Chronic 3 mm retrolisthesis of C3 on C4 also reported in 2012 as 3 mm. 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   On: 11/03/2015 20:23   Ct Abdomen Pelvis W Contrast Result Date: 11/03/2015 CLINICAL DATA:  Fall 4 feet onto right side.  Pain EXAM: CT CHEST, ABDOMEN, AND PELVIS WITH CONTRAST TECHNIQUE: Multidetector CT imaging of the chest, abdomen and pelvis was performed following the standard protocol during bolus administration of intravenous contrast. CONTRAST:  19mL ISOVUE-300 IOPAMIDOL (ISOVUE-300) INJECTION 61% COMPARISON:  CT abdomen pelvis 02/15/2015. FINDINGS: CT CHEST FINDINGS Cardiovascular: Thoracic aorta normal. Heart size normal. Normal enhancement of the pulmonary arteries. No pericardial effusion.  Mediastinum/Nodes: Negative for mediastinal hematoma. No mass or adenopathy. Lungs/Pleura: 3 mm right upper lobe nodule. 5 mm nodule along the minor fissure most likely a lymph node. No lung mass. Linear scarring in the left lung base. No infiltrate or effusion. Musculoskeletal: Negative for thoracic spine fracture. No rib fracture identified. CT ABDOMEN PELVIS FINDINGS Hepatobiliary: 10 x 15 mm hypodensity in the dome of the liver posteriorly. Possible hemangioma or cyst. No other liver lesion. Gallbladder and bile ducts normal. No evidence of liver injury. Pancreas: Negative Spleen: negative Adrenals/Urinary Tract: Symmetric excretion of contrast by both kidneys. No renal mass or injury. No hydronephrosis. No renal calculi. Urinary bladder normal. Stomach/Bowel: Stomach and duodenum normal. Negative for bowel obstruction. Large amount of stool throughout the colon. Normal appendix. No bowel edema. Vascular/Lymphatic: Mild atherosclerotic disease in the aorta. Negative for aneurysm. No adenopathy. Reproductive: Normal  prostate. Other: No free fluid. No soft tissue contusion. Negative for hernia. Musculoskeletal: Negative for fracture. Right hip replacement in satisfactory alignment. IMPRESSION: No acute injury in the chest abdomen or pelvis. Electronically Signed   By: [name] Gallo M.D.   On: 11/03/2015 20:20    2040:  XR/CT reassuring. Tx symptomatically, f/u Ortho MD.  Dx and testing d/w pt and family.  Questions answered.  Verb understanding, agreeable to d/c home with outpt f/u.   Final Clinical Impressions(s) / ED Diagnoses   Final diagnoses:  None    New Prescriptions New Prescriptions   No medications on file     Francine Graven, DO 11/05/15 2017

## 2015-11-05 ENCOUNTER — Ambulatory Visit (INDEPENDENT_AMBULATORY_CARE_PROVIDER_SITE_OTHER): Payer: Medicare Other | Admitting: Orthopaedic Surgery

## 2015-11-05 ENCOUNTER — Telehealth: Payer: Self-pay | Admitting: Radiology

## 2015-11-05 ENCOUNTER — Encounter: Payer: Self-pay | Admitting: Orthopaedic Surgery

## 2015-11-05 VITALS — BP 133/87 | HR 93 | Temp 97.9°F | Ht 75.0 in | Wt 217.0 lb

## 2015-11-05 DIAGNOSIS — M25511 Pain in right shoulder: Secondary | ICD-10-CM | POA: Diagnosis not present

## 2015-11-05 DIAGNOSIS — F17210 Nicotine dependence, cigarettes, uncomplicated: Secondary | ICD-10-CM

## 2015-11-05 NOTE — Progress Notes (Signed)
Subjective: I fell and hurt my right shoulder    Patient ID: [name], male    DOB: 11-May-1965, 50 y.o.   MRN: [id number]  HPI He fell off ladder while cleaning gutters on 11-03-15.  He was seen in the ER.  He had multiple x-rays and CT of neck and head as well as x-rays of the right shoulder.  No fracture was noted. He had no loss of consciousness.  He has been given sling, referred here.  He is better.  He has no numbness.  He has pain with motion of the right shoulder.  He has used ice which helps.   Review of Systems  HENT: Positive for congestion.   Respiratory: Positive for cough and shortness of breath.   Cardiovascular: Positive for chest pain and leg swelling.  Endocrine: Positive for cold intolerance.  Musculoskeletal: Positive for arthralgias and joint swelling.  Allergic/Immunologic: Positive for environmental allergies.  Psychiatric/Behavioral: The patient is nervous/anxious.    Past Medical History:  Diagnosis Date  . Anxiety   . Arthritis    "everywhere" (04/21/2012)  . Cellulitis and abscess of foot 12/19/2013   LEFT FOOT  . Chronic pain   . DDD (degenerative disc disease)   . Depression   . Diabetes mellitus without complication (HCC)    borderline  . Diabetic foot ulcer ([place])   . ETOH abuse   . GERD (gastroesophageal reflux disease)    tums  . History of blood transfusion    "related to left knee OR; probably right hip too" (04/21/2012)  . Mental disorder   . Neuromuscular disorder (HCC)    neuropathy  . Noncompliance   . Open wound    bottom of foot  . Pneumonia ~ 2012  . Polysubstance abuse    etoh, cocaine, heroin  . Stroke [facility]) 2008   "they said I might have had one during right hip replacement" (04/21/2012)    Past Surgical History:  Procedure Laterality Date  . JOINT REPLACEMENT    . KNEE ARTHROSCOPY Bilateral 1980's/1990's  . LUNG LOBECTOMY Left ~ 2006  . LUNG LOBECTOMY    . METATARSAL OSTEOTOMY  10/29/2011   Procedure: METATARSAL  OSTEOTOMY;  Surgeon: [name], MD;  Location: [place];  Service: Orthopedics;  Laterality: Left;  Left 1st Metatarsal Dorsal Closing Wedge   . REVISION TOTAL HIP ARTHROPLASTY Right 2008   "4-5 months after replacement" (04/21/2012)  . TOTAL HIP ARTHROPLASTY Right 2008  . TOTAL KNEE ARTHROPLASTY Left 2006  . TOTAL KNEE ARTHROPLASTY Right 04/20/2012  . TOTAL KNEE ARTHROPLASTY Right 04/20/2012   Procedure: TOTAL KNEE ARTHROPLASTY;  Surgeon: [name], MD;  Location: [place];  Service: Orthopedics;  Laterality: Right;  Right Total Knee Arthroplasty    Current Outpatient Prescriptions on File Prior to Visit  Medication Sig Dispense Refill  . acetaminophen-codeine (TYLENOL #3) 300-30 MG tablet Take 1-2 tablets by mouth every 6 (six) hours as needed for moderate pain. (Patient not taking: Reported on 11/03/2015) 10 tablet 0  . clindamycin (CLEOCIN) 150 MG capsule Take 2 capsules (300 mg total) by mouth 4 (four) times daily. For 7 days (Patient not taking: Reported on 11/03/2015) 56 capsule 0  . clonazePAM (KLONOPIN) 1 MG tablet Take 1 mg by mouth 4 (four) times daily.    Marland Kitchen escitalopram (LEXAPRO) 20 MG tablet Take 1 tablet (20 mg total) by mouth every morning. For depression (Patient taking differently: Take 40 mg by mouth every morning. For depression) 30 tablet 0  .  gabapentin (NEURONTIN) 400 MG capsule Take 1 capsule (400 mg total) by mouth 4 (four) times daily. For substance withdrawal syndrome 120 capsule 0  . HYDROcodone-acetaminophen (NORCO/VICODIN) 5-325 MG tablet 1 or 2 tabs PO q6 hours prn pain 15 tablet 0  . ibuprofen (ADVIL,MOTRIN) 200 MG tablet Take 800 mg by mouth every 6 (six) hours as needed for mild pain or moderate pain.    . magic mouthwash w/lidocaine SOLN Take 5 mLs by mouth 3 (three) times daily as needed for mouth pain. Swish and spit, do not swallow 100 mL 0  . metFORMIN (GLUCOPHAGE) 500 MG tablet Take 1 tablet (500 mg total) by mouth 2 (two) times daily with a meal. 180 tablet 0   . methocarbamol (ROBAXIN) 500 MG tablet Take 2 tablets (1,000 mg total) by mouth 4 (four) times daily as needed for muscle spasms (muscle spasm/pain). 25 tablet 0  . mirtazapine (REMERON) 30 MG tablet Take 30 mg by mouth at bedtime.    Marland Kitchen QUEtiapine (SEROQUEL) 200 MG tablet Take 1 tablet (200 mg total) by mouth at bedtime. For mood control 30 tablet 0   No current facility-administered medications on file prior to visit.     Social History   Social History  . Marital status: Divorced    Spouse name: N/A  . Number of children: N/A  . Years of education: N/A   Occupational History  . Not on file.   Social History Main Topics  . Smoking status: Current Every Day Smoker    Packs/day: 1.00    Years: 30.00    Types: Cigarettes  . Smokeless tobacco: Never Used     Comment: 04/21/2012 offered smoking cessation materials; pt declines  12/19/2013 DECLINES SMOKING CESSATION MATERIALS  . Alcohol use No     Comment: Quit 09/20/14  . Drug use:     Types: Cocaine     Comment: Cocaine and heroin last used 08/29/14  . Sexual activity: Yes    Birth control/ protection: None   Other Topics Concern  . Not on file   Social History Narrative  . No narrative on file    Family History  Problem Relation Age of Onset  . Diabetes Mother   . Diabetes Father     BP 133/87   Pulse 93   Temp 97.9 F (36.6 C)   Ht 6\' 3"  (1.905 m)   Wt 217 lb (98.4 kg)   BMI 27.12 kg/m      Objective:   Physical Exam  Constitutional: He is oriented to person, place, and time. He appears well-developed and well-nourished.  HENT:  Head: Normocephalic and atraumatic.  Eyes: Conjunctivae and EOM are normal. Pupils are equal, round, and reactive to light.  Neck: Normal range of motion. Neck supple.  Cardiovascular: Normal rate, regular rhythm and intact distal pulses.   Pulmonary/Chest: Effort normal.  Abdominal: Soft.  Musculoskeletal: He exhibits tenderness (Pain right shoulder, no effusion, NV intact, ROM  forward 140, abduction 90, internal 30, external 25, extension 10, addution full.  NV intact.  Grips normal. ROM neck full.).  Neurological: He is alert and oriented to person, place, and time. He has normal reflexes. No cranial nerve deficit. He exhibits normal muscle tone. Coordination normal.  Skin: Skin is warm and dry.  Psychiatric: He has a normal mood and affect. His behavior is normal. Judgment and thought content normal.   He smokes and is not willing to quit or cut back.  He has history of poly substance  abuse.  I have recommended Aleve one po bid pc.  I will begin PT/OT for the right shoulder.  I have gone over exercises for him and will print out sheet of instructions.     Assessment & Plan:   Encounter Diagnoses  Name Primary?  . Pain in joint of right shoulder Yes  . Cigarette nicotine dependence without complication    Begin PT/OT.  Return in two weeks.  Call if any problem.  Precautions discussed.  Electronically Signed Sanjuana Kava, MD 10/17/20179:52 AM

## 2015-11-05 NOTE — Patient Instructions (Signed)
Shoulder Range of Motion Exercises Shoulder range of motion (ROM) exercises are designed to keep the shoulder moving freely. They are often recommended for people who have shoulder pain. MOVEMENT EXERCISE When you are able, do this exercise 5-6 days per week, or as told by your health care provider. Work toward doing 2 sets of 10 swings. Pendulum Exercise How To Do This Exercise Lying Down 1. Lie face-down on a bed with your abdomen close to the side of the bed. 2. Let your arm hang over the side of the bed. 3. Relax your shoulder, arm, and hand. 4. Slowly and gently swing your arm forward and back. Do not use your neck muscles to swing your arm. They should be relaxed. If you are struggling to swing your arm, have someone gently swing it for you. When you do this exercise for the first time, swing your arm at a 15 degree angle for 15 seconds, or swing your arm 10 times. As pain lessens over time, increase the angle of the swing to 30-45 degrees. 5. Repeat steps 1-4 with the other arm. How To Do This Exercise While Standing 1. Stand next to a sturdy chair or table and hold on to it with your hand.  Bend forward at the waist.  Bend your knees slightly.  Relax your other arm and let it hang limp.  Relax the shoulder blade of the arm that is hanging and let it drop.  While keeping your shoulder relaxed, use body motion to swing your arm in small circles. The first time you do this exercise, swing your arm for about 30 seconds or 10 times. When you do it next time, swing your arm for a little longer.  Stand up tall and relax.  Repeat steps 1-7, this time changing the direction of the circles. 2. Repeat steps 1-8 with the other arm. STRETCHING EXERCISES Do these exercises 3-4 times per day on 5-6 days per week or as told by your health care provider. Work toward holding the stretch for 20 seconds. Stretching Exercise 1 1. Lift your arm straight out in front of you. 2. Bend your arm 90  degrees at the elbow (right angle) so your forearm goes across your body and looks like the letter "L." 3. Use your other arm to gently pull the elbow forward and across your body. 4. Repeat steps 1-3 with the other arm. Stretching Exercise 2 You will need a towel or rope for this exercise. 1. Bend one arm behind your back with the palm facing outward. 2. Hold a towel with your other hand. 3. Reach the arm that holds the towel above your head, and bend that arm at the elbow. Your wrist should be behind your neck. 4. Use your free hand to grab the free end of the towel. 5. With the higher hand, gently pull the towel up behind you. 6. With the lower hand, pull the towel down behind you. 7. Repeat steps 1-6 with the other arm. STRENGTHENING EXERCISES Do each of these exercises at four different times of day (sessions) every day or as told by your health care provider. To begin with, repeat each exercise 5 times (repetitions). Work toward doing 3 sets of 12 repetitions or as told by your health care provider. Strengthening Exercise 1 You will need a light weight for this activity. As you grow stronger, you may use a heavier weight. 1. Standing with a weight in your hand, lift your arm straight out to the side   until it is at the same height as your shoulder. 2. Bend your arm at 90 degrees so that your fingers are pointing to the ceiling. 3. Slowly raise your hand until your arm is straight up in the air. 4. Repeat steps 1-3 with the other arm. Strengthening Exercise 2 You will need a light weight for this activity. As you grow stronger, you may use a heavier weight. 1. Standing with a weight in your hand, gradually move your straight arm in an arc, starting at your side, then out in front of you, then straight up over your head. 2. Gradually move your other arm in an arc, starting at your side, then out in front of you, then straight up over your head. 3. Repeat steps 1-2 with the other  arm. Strengthening Exercise 3 You will need an elastic band for this activity. As you grow stronger, gradually increase the size of the bands or increase the number of bands that you use at one time. 1. While standing, hold an elastic band in one hand and raise that arm up in the air. 2. With your other hand, pull down the band until that hand is by your side. 3. Repeat steps 1-2 with the other arm.   This information is not intended to replace advice given to you by your health care provider. Make sure you discuss any questions you have with your health care provider.   Document Released: 10/04/2002 Document Revised: 05/22/2014 Document Reviewed: 01/01/2014 Elsevier Interactive Patient Education 2016 [employer] Smoking Cessation, Tips for Success If you are ready to quit smoking, congratulations! You have chosen to help yourself be healthier. Cigarettes bring nicotine, tar, carbon monoxide, and other irritants into your body. Your lungs, heart, and blood vessels will be able to work better without these poisons. There are many different ways to quit smoking. Nicotine gum, nicotine patches, a nicotine inhaler, or nicotine nasal spray can help with physical craving. Hypnosis, support groups, and medicines help break the habit of smoking. WHAT THINGS CAN I DO TO MAKE QUITTING EASIER?  Here are some tips to help you quit for good:  Pick a date when you will quit smoking completely. Tell all of your friends and family about your plan to quit on that date.  Do not try to slowly cut down on the number of cigarettes you are smoking. Pick a quit date and quit smoking completely starting on that day.  Throw away all cigarettes.   Clean and remove all ashtrays from your home, work, and car.  On a card, write down your reasons for quitting. Carry the card with you and read it when you get the urge to smoke.  Cleanse your body of nicotine. Drink enough water and fluids to keep your urine clear or  pale yellow. Do this after quitting to flush the nicotine from your body.  Learn to predict your moods. Do not let a bad situation be your excuse to have a cigarette. Some situations in your life might tempt you into wanting a cigarette.  Never have "just one" cigarette. It leads to wanting another and another. Remind yourself of your decision to quit.  Change habits associated with smoking. If you smoked while driving or when feeling stressed, try other activities to replace smoking. Stand up when drinking your coffee. Brush your teeth after eating. Sit in a different chair when you read the paper. Avoid alcohol while trying to quit, and try to drink fewer caffeinated beverages. Alcohol and   caffeine may urge you to smoke.  Avoid foods and drinks that can trigger a desire to smoke, such as sugary or spicy foods and alcohol.  Ask people who smoke not to smoke around you.  Have something planned to do right after eating or having a cup of coffee. For example, plan to take a walk or exercise.  Try a relaxation exercise to calm you down and decrease your stress. Remember, you may be tense and nervous for the first 2 weeks after you quit, but this will pass.  Find new activities to keep your hands busy. Play with a pen, coin, or rubber band. Doodle or draw things on paper.  Brush your teeth right after eating. This will help cut down on the craving for the taste of tobacco after meals. You can also try mouthwash.   Use oral substitutes in place of cigarettes. Try using lemon drops, carrots, cinnamon sticks, or chewing gum. Keep them handy so they are available when you have the urge to smoke.  When you have the urge to smoke, try deep breathing.  Designate your home as a nonsmoking area.  If you are a heavy smoker, ask your health care provider about a prescription for nicotine chewing gum. It can ease your withdrawal from nicotine.  Reward yourself. Set aside the cigarette money you save and  buy yourself something nice.  Look for support from others. Join a support group or smoking cessation program. Ask someone at home or at work to help you with your plan to quit smoking.  Always ask yourself, "Do I need this cigarette or is this just a reflex?" Tell yourself, "Today, I choose not to smoke," or "I do not want to smoke." You are reminding yourself of your decision to quit.  Do not replace cigarette smoking with electronic cigarettes (commonly called e-cigarettes). The safety of e-cigarettes is unknown, and some may contain harmful chemicals.  If you relapse, do not give up! Plan ahead and think about what you will do the next time you get the urge to smoke. HOW WILL I FEEL WHEN I QUIT SMOKING? You may have symptoms of withdrawal because your body is used to nicotine (the addictive substance in cigarettes). You may crave cigarettes, be irritable, feel very hungry, cough often, get headaches, or have difficulty concentrating. The withdrawal symptoms are only temporary. They are strongest when you first quit but will go away within 10-14 days. When withdrawal symptoms occur, stay in control. Think about your reasons for quitting. Remind yourself that these are signs that your body is healing and getting used to being without cigarettes. Remember that withdrawal symptoms are easier to treat than the major diseases that smoking can cause.  Even after the withdrawal is over, expect periodic urges to smoke. However, these cravings are generally short lived and will go away whether you smoke or not. Do not smoke! WHAT RESOURCES ARE AVAILABLE TO HELP ME QUIT SMOKING? Your health care provider can direct you to community resources or hospitals for support, which may include:  Group support.  Education.  Hypnosis.  Therapy.   This information is not intended to replace advice given to you by your health care provider. Make sure you discuss any questions you have with your health care  provider.   Document Released: 10/04/2003 Document Revised: 01/26/2014 Document Reviewed: 06/23/2012 Elsevier Interactive Patient Education 2016 Elsevier Inc.  

## 2015-11-05 NOTE — Telephone Encounter (Signed)
Entered in error

## 2015-11-19 ENCOUNTER — Encounter: Payer: Self-pay | Admitting: Orthopaedic Surgery

## 2015-11-19 ENCOUNTER — Ambulatory Visit: Payer: Self-pay | Admitting: Orthopaedic Surgery

## 2016-01-24 IMAGING — CR DG FEMUR 2+V*R*
4 series · 4 of 4 positions shown · non-contrast
Comparison: 03/02/2012

CLINICAL DATA: Fall.  Right hip pain.

EXAM:
RIGHT FEMUR - 2 VIEW

[view not recorded (1 of 4)]
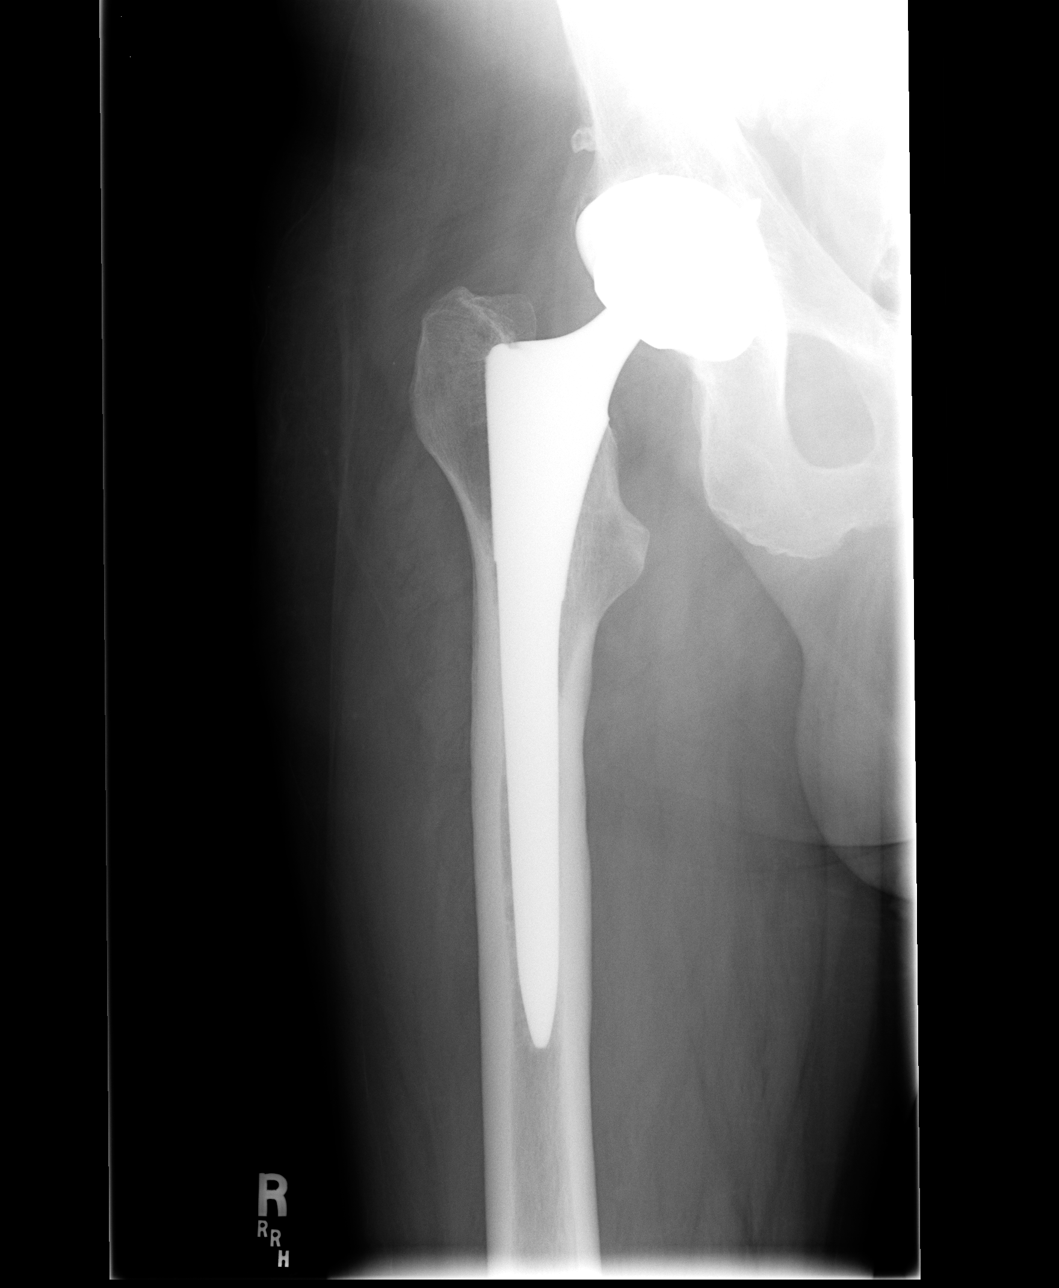

[view not recorded (2 of 4)]
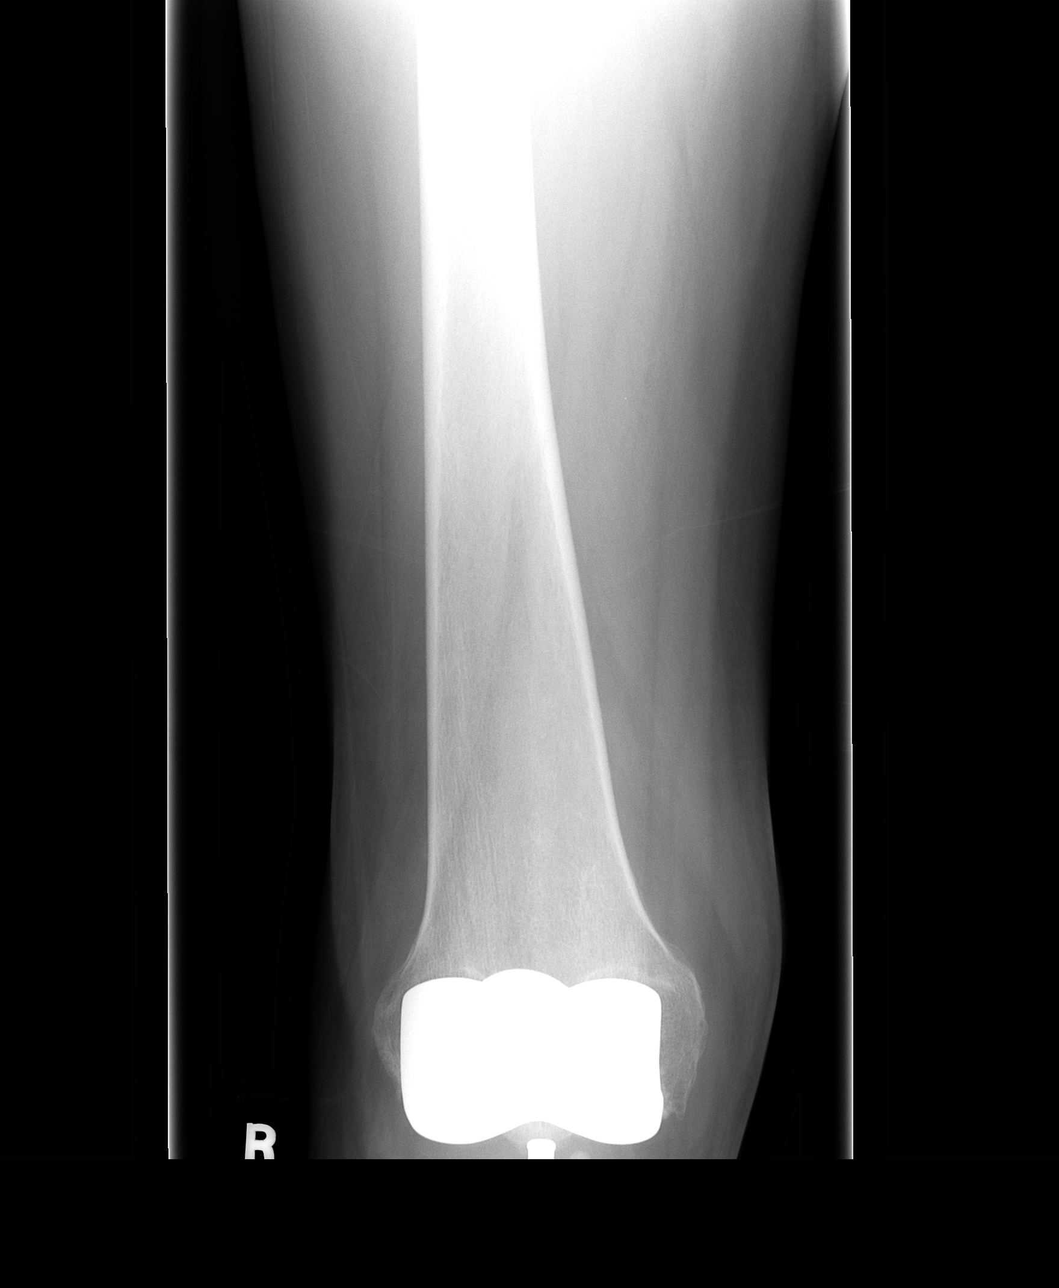

[view not recorded (3 of 4)]
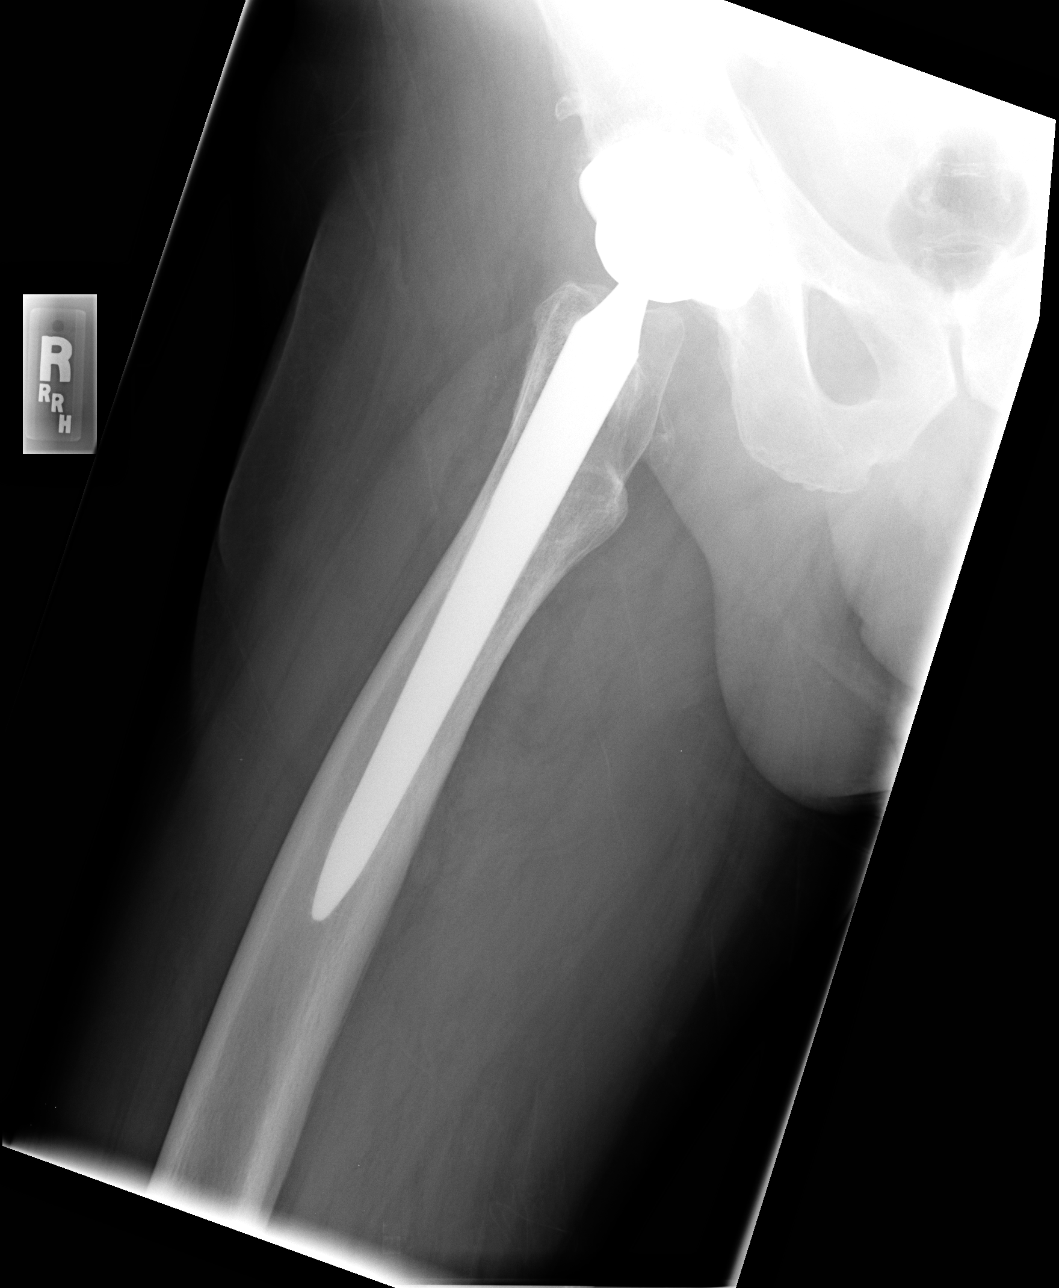

[view not recorded (4 of 4)]
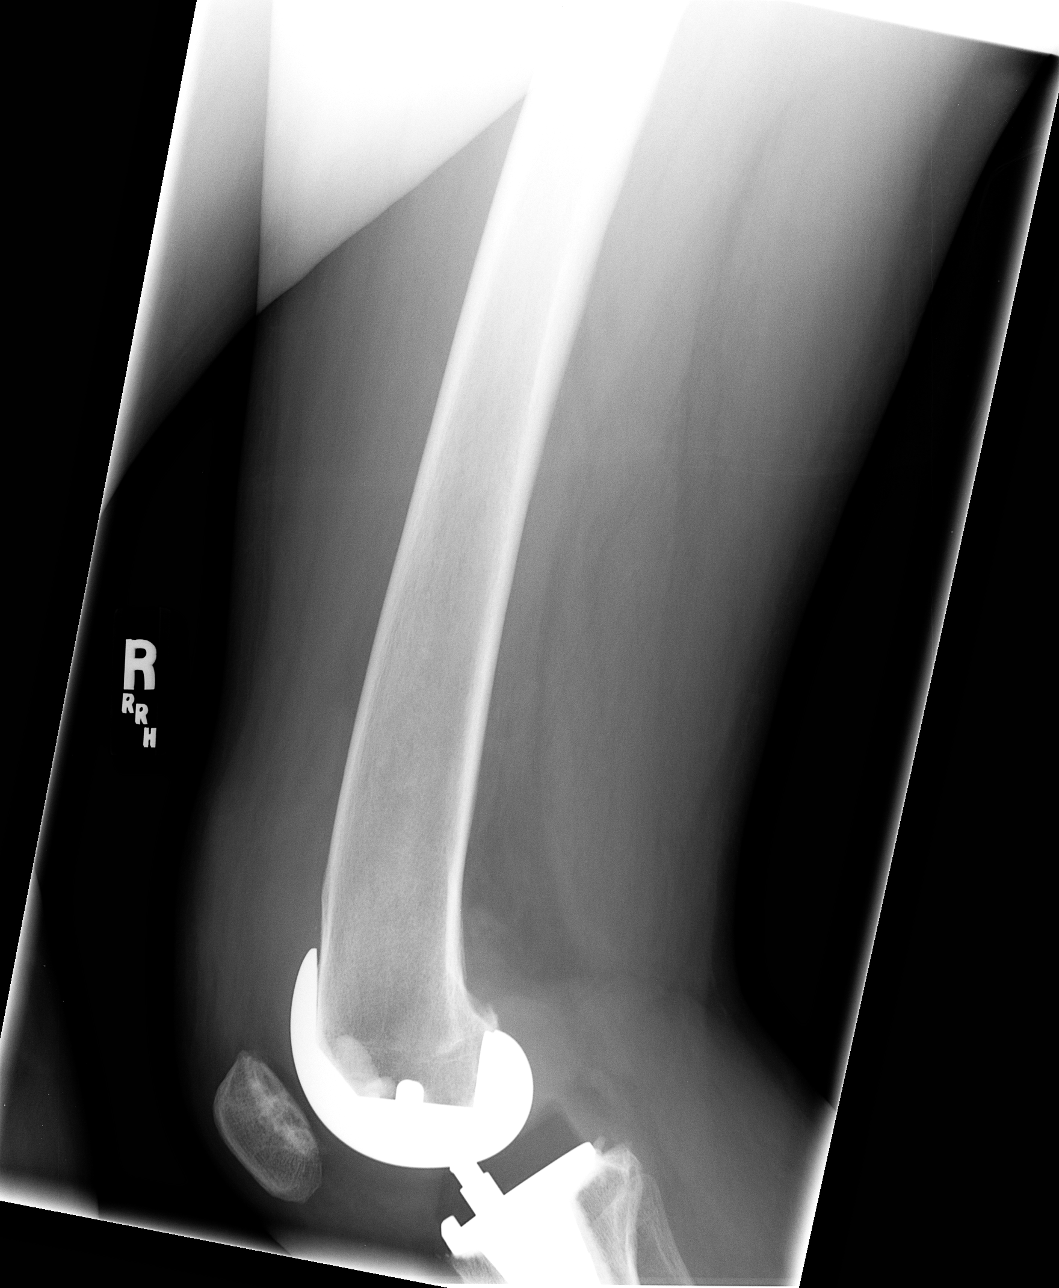

[4 of 4 positions shown; findings below may reference images not displayed]

FINDINGS: No fracture or bone lesion.

The knee and hip prosthetic components are well-seated and aligned.
No evidence of loosening.

The soft tissues are unremarkable.
IMPRESSION: No fracture or dislocation.

## 2016-01-24 IMAGING — CR DG PELVIS 1-2V
1 series · 1 of 1 positions shown · non-contrast
Comparison: None.

CLINICAL DATA: Right hip pain, pelvis pain

EXAM:
PELVIS - 1-2 VIEW

[view not recorded]
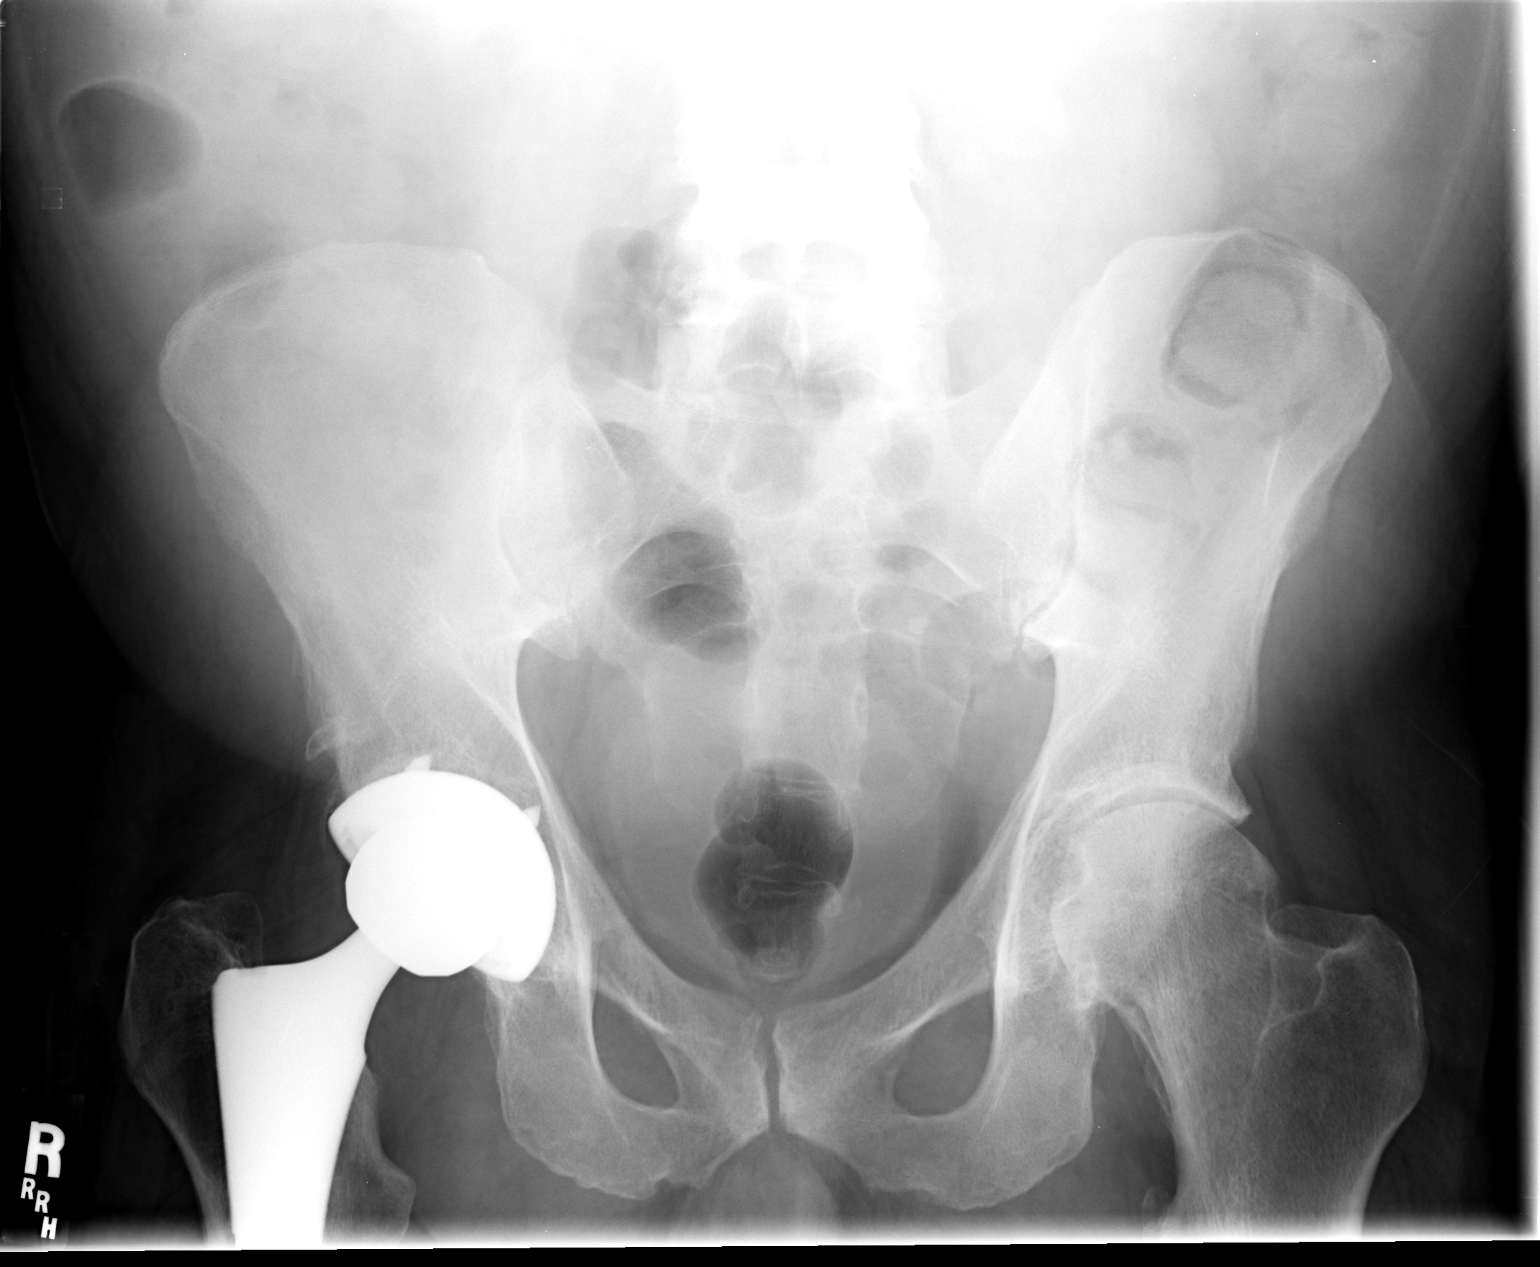

[1 of 1 positions shown; findings below may reference images not displayed]

FINDINGS: No acute fracture or dislocation. Right total hip arthroplasty. Mild
osteoarthritis of the left hip.
IMPRESSION: Right total hip arthroplasty without failure or complication. The
distal aspect of the femoral stem component is excluded from the
field of view.

## 2016-03-14 IMAGING — CR DG FOOT COMPLETE 3+V*L*
3 series · 3 of 3 positions shown · non-contrast
Comparison: 11/15/2009

CLINICAL DATA: Fall.

EXAM:
LEFT FOOT - COMPLETE 3+ VIEW

[view not recorded (1 of 3)]
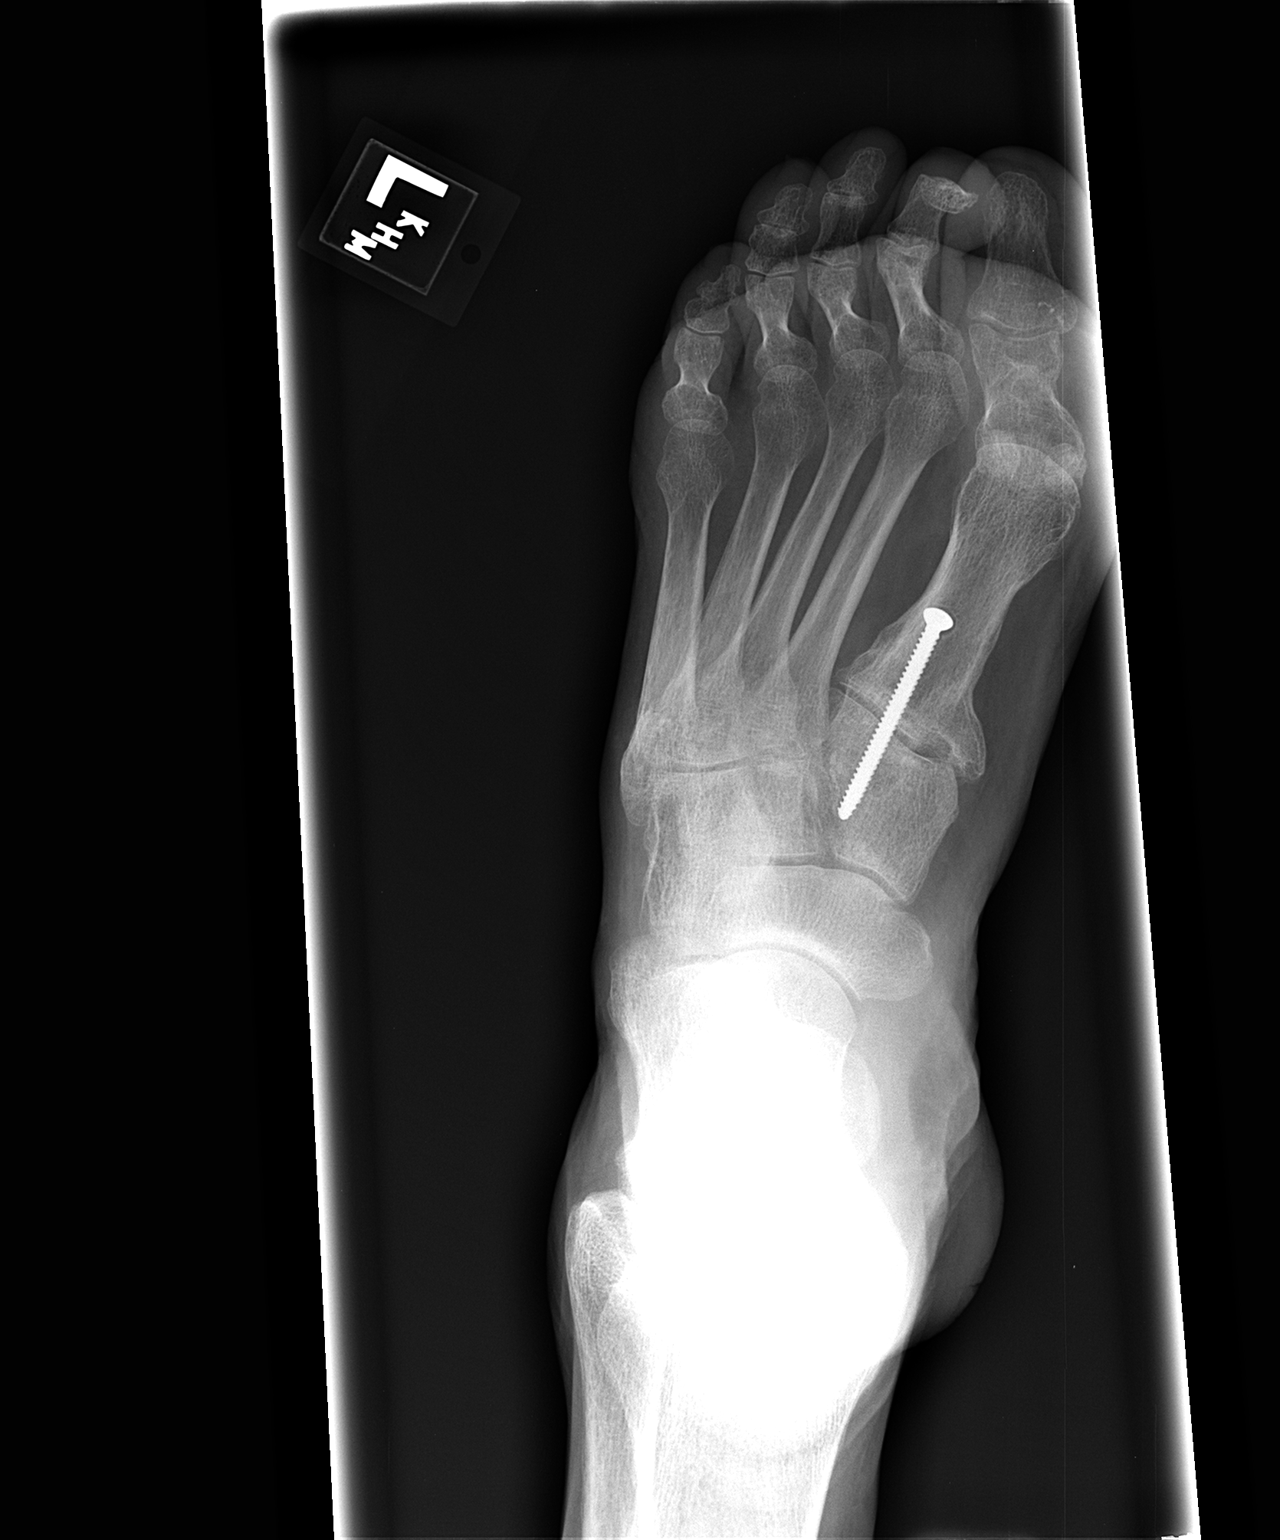

[view not recorded (2 of 3)]
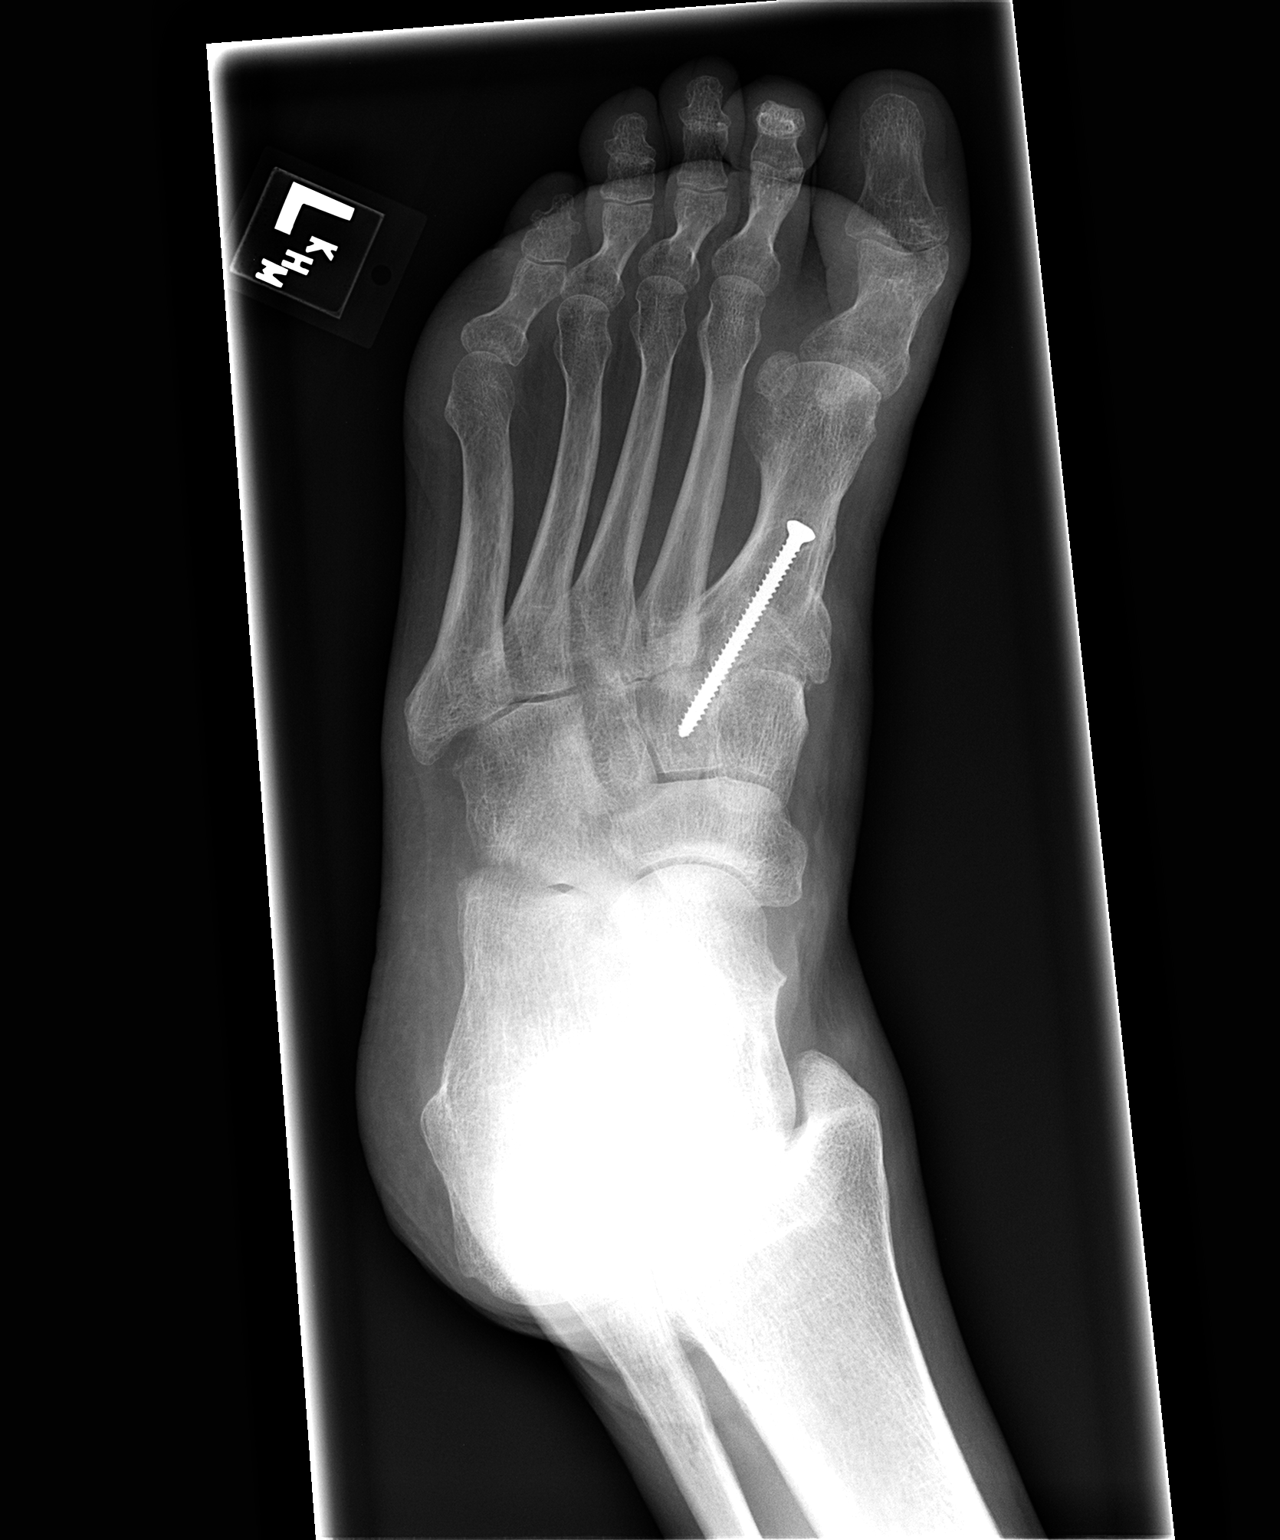

[view not recorded (3 of 3)]
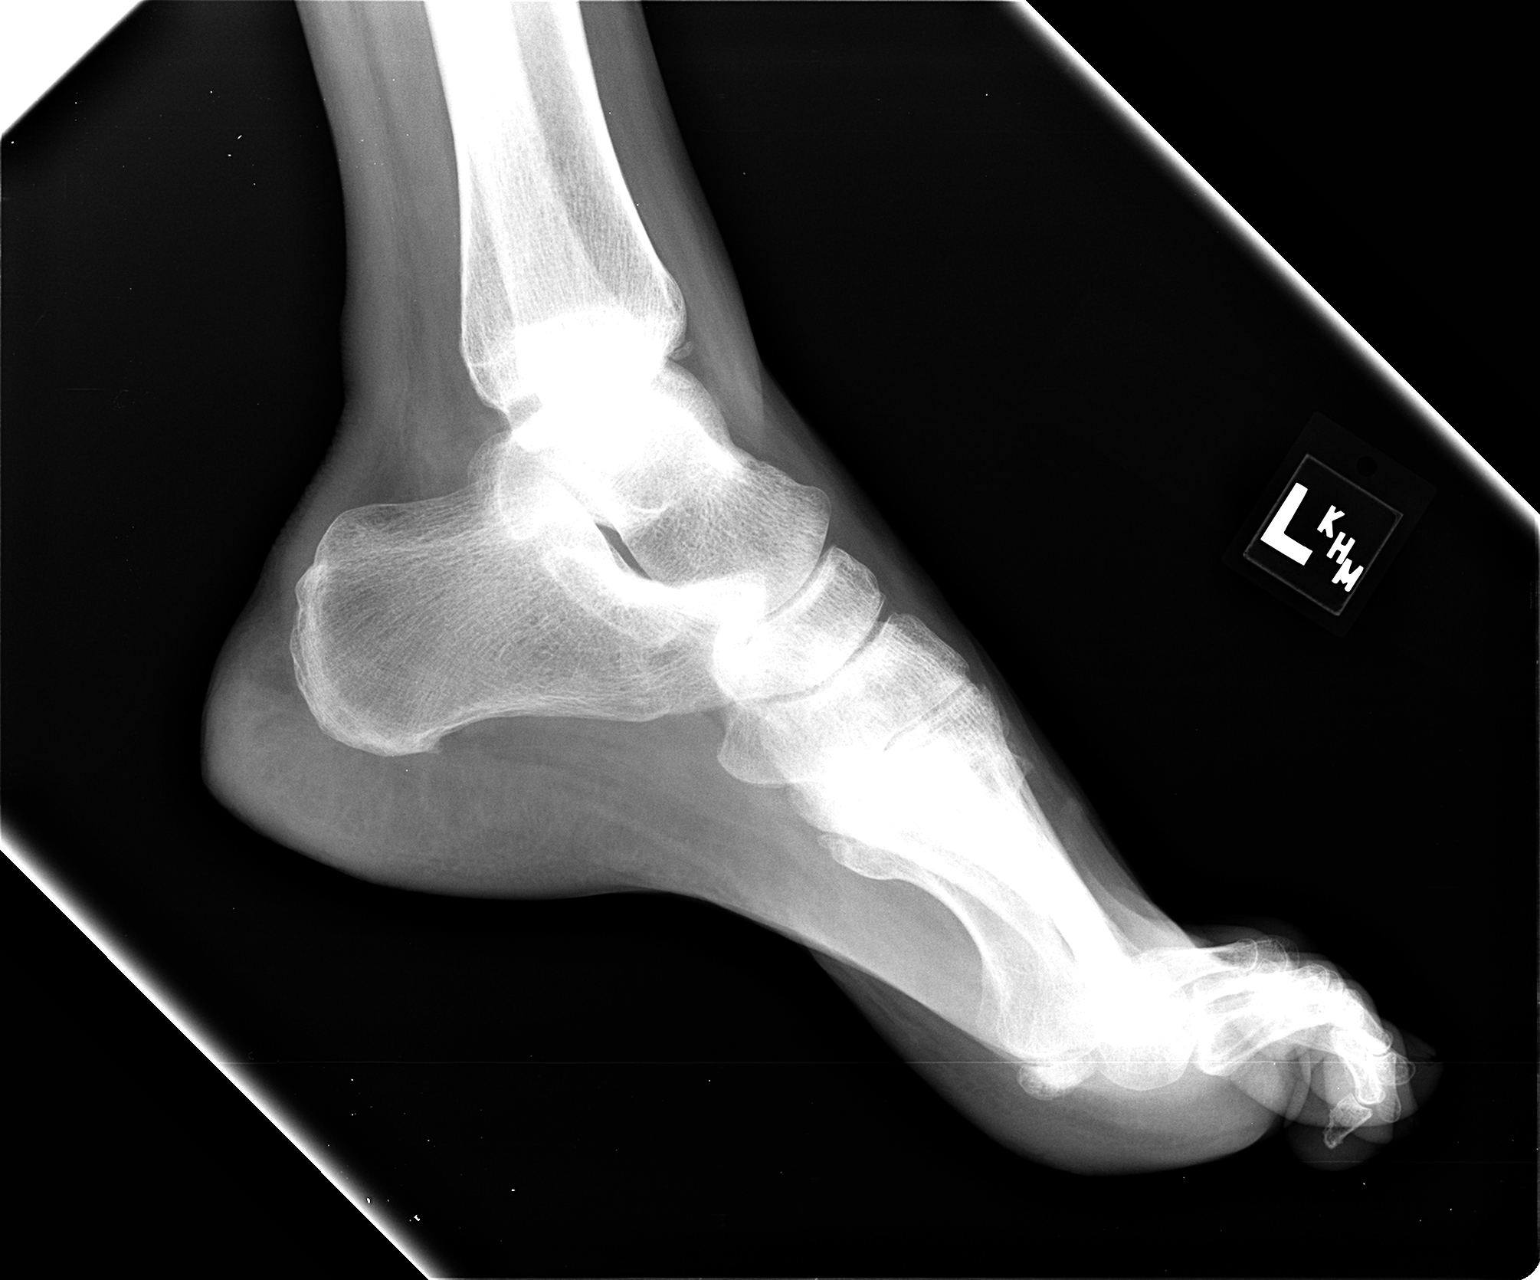

[3 of 3 positions shown; findings below may reference images not displayed]

FINDINGS: There is an orthopedic screw extending from the first metatarsal
into the region of the middle cuneiform bone bridging the tarsal
metatarsal joint. There are mild degenerative changes over the
midfoot. There is no acute fracture dislocation.
IMPRESSION: No acute findings.

## 2016-03-14 IMAGING — CR DG HIP COMPLETE 2+V*R*
3 series · 3 of 3 positions shown · non-contrast
Comparison: 05/11/2013

CLINICAL DATA: Fall.  Right hip pain.

EXAM:
RIGHT HIP - COMPLETE 2+ VIEW

[view not recorded (1 of 3)]
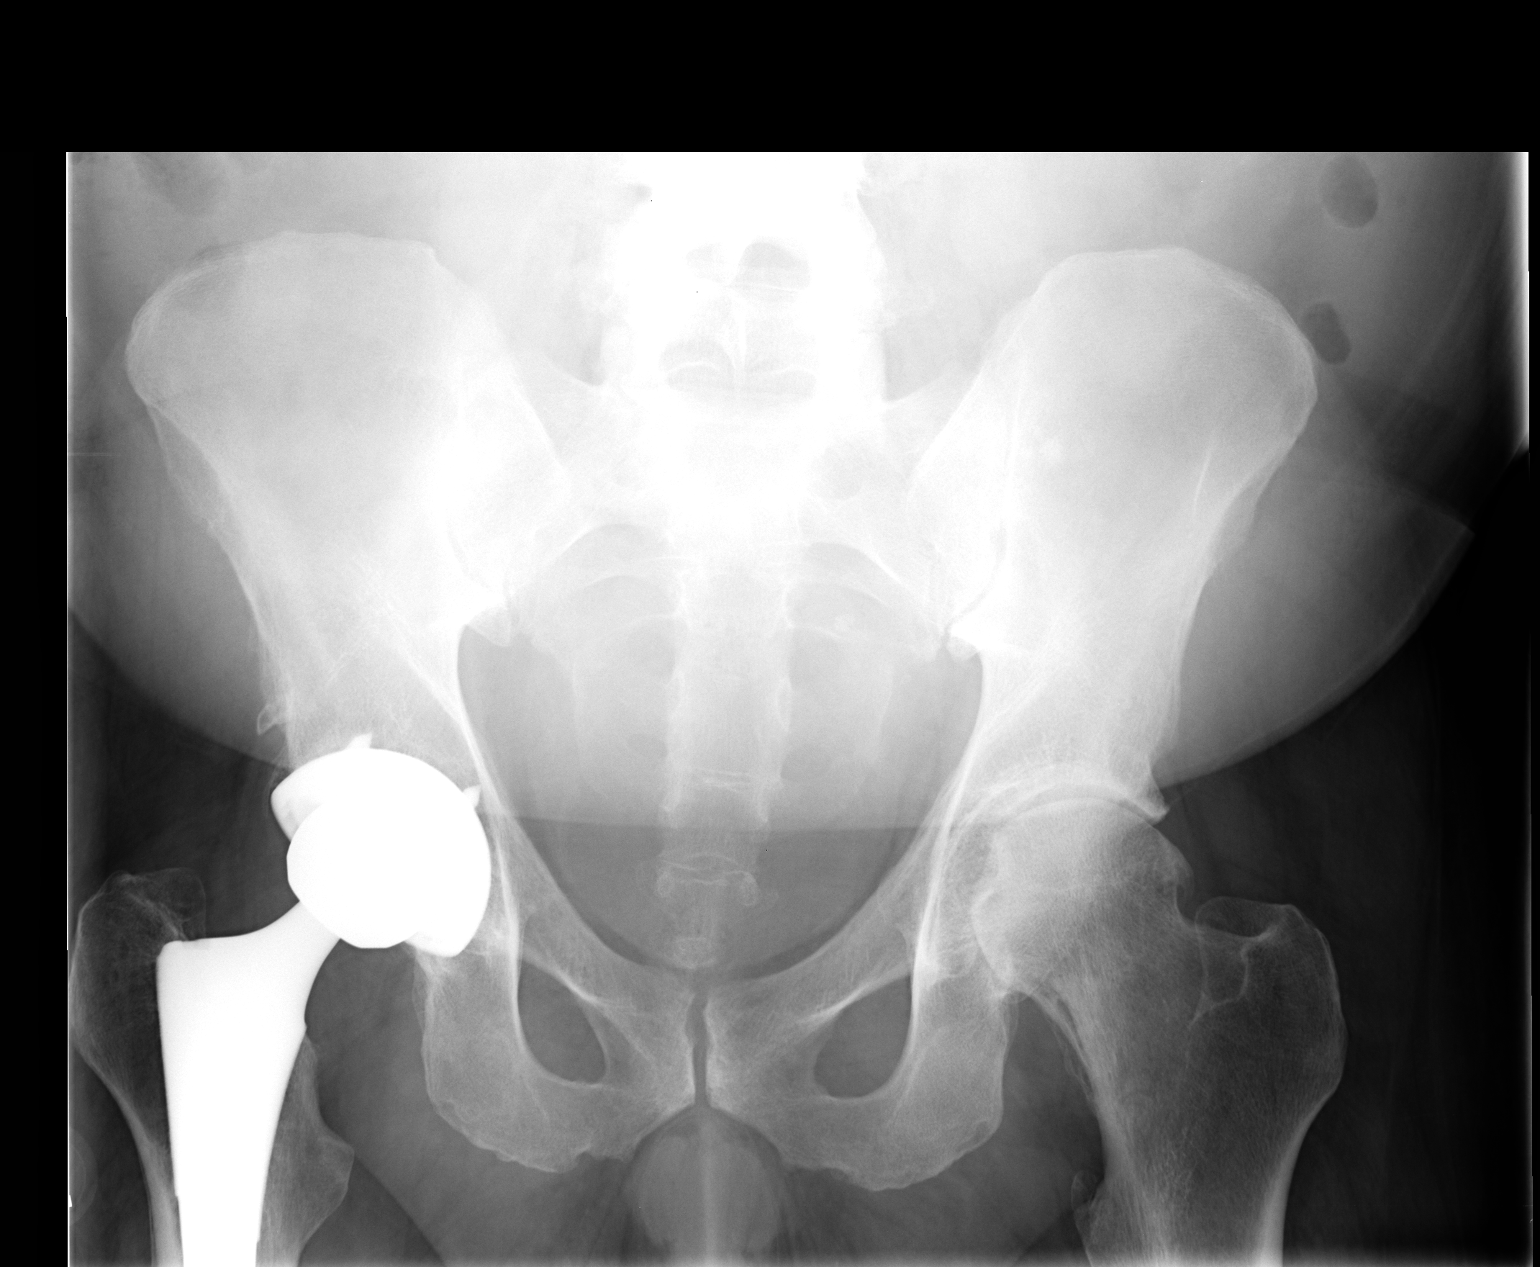

[view not recorded (2 of 3)]
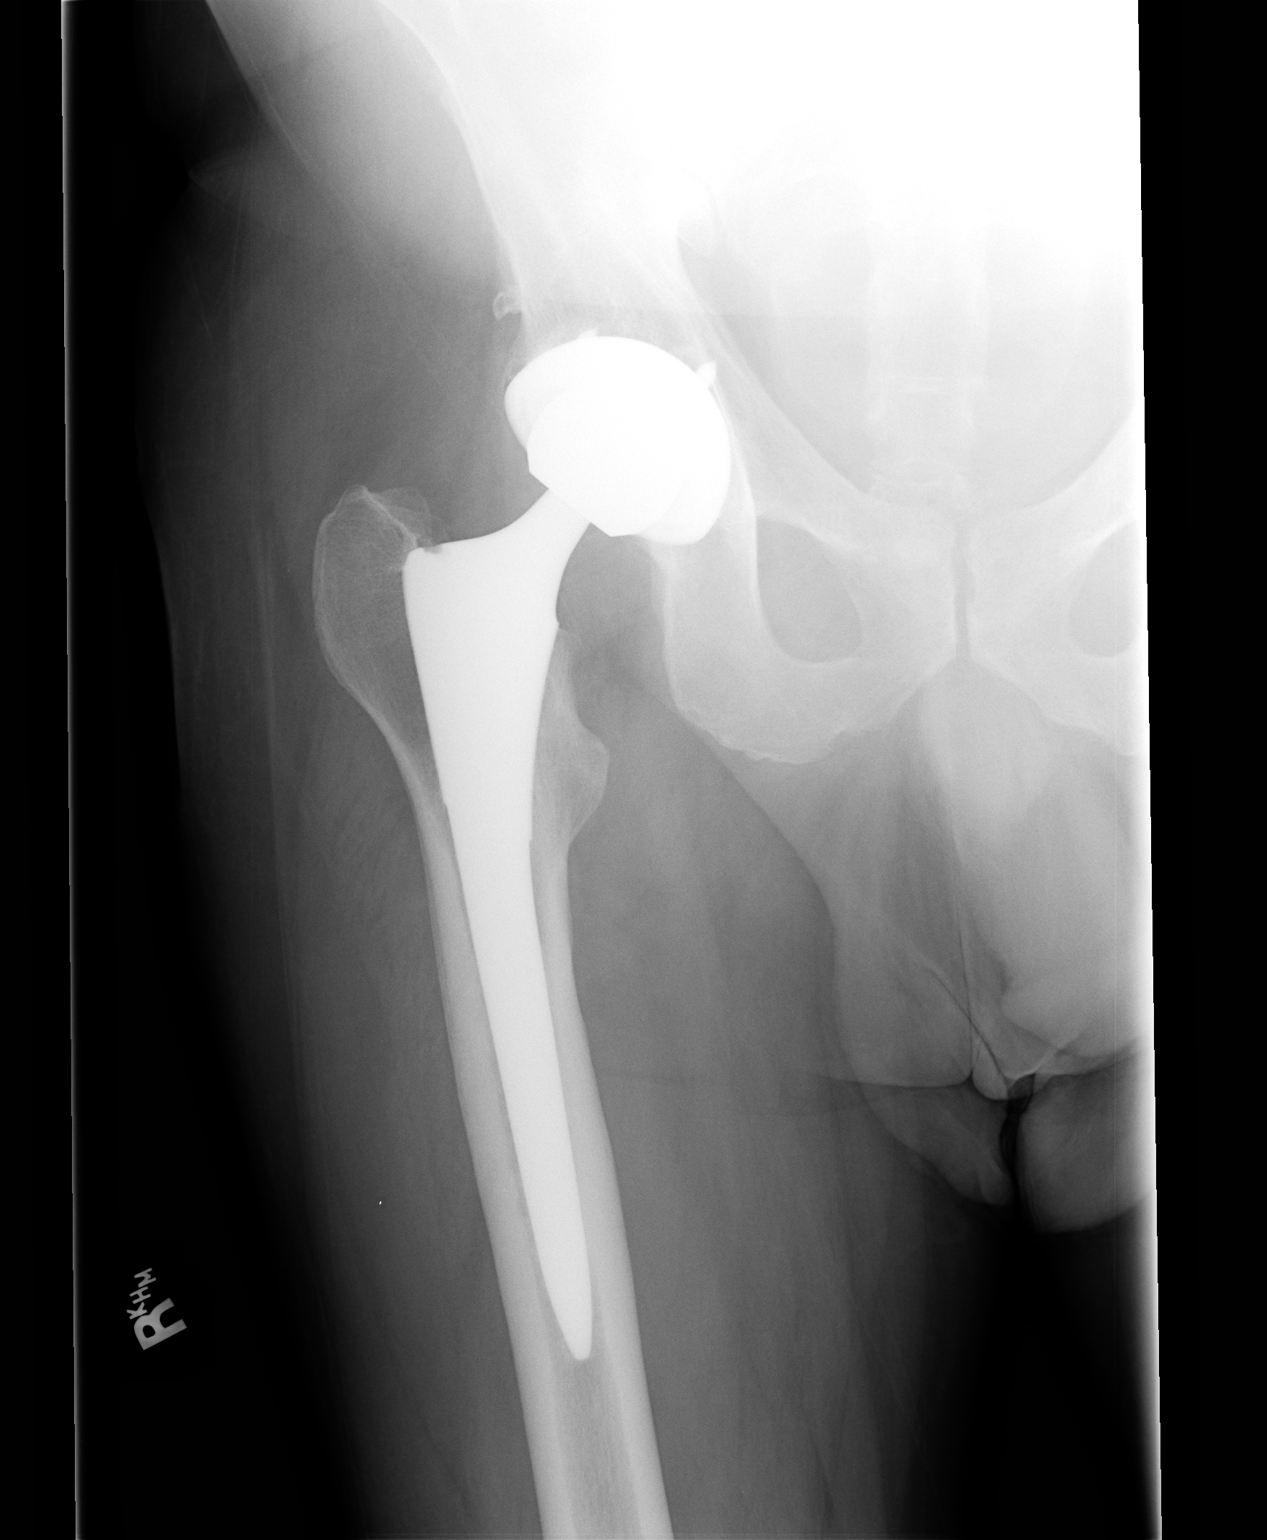

[view not recorded (3 of 3)]
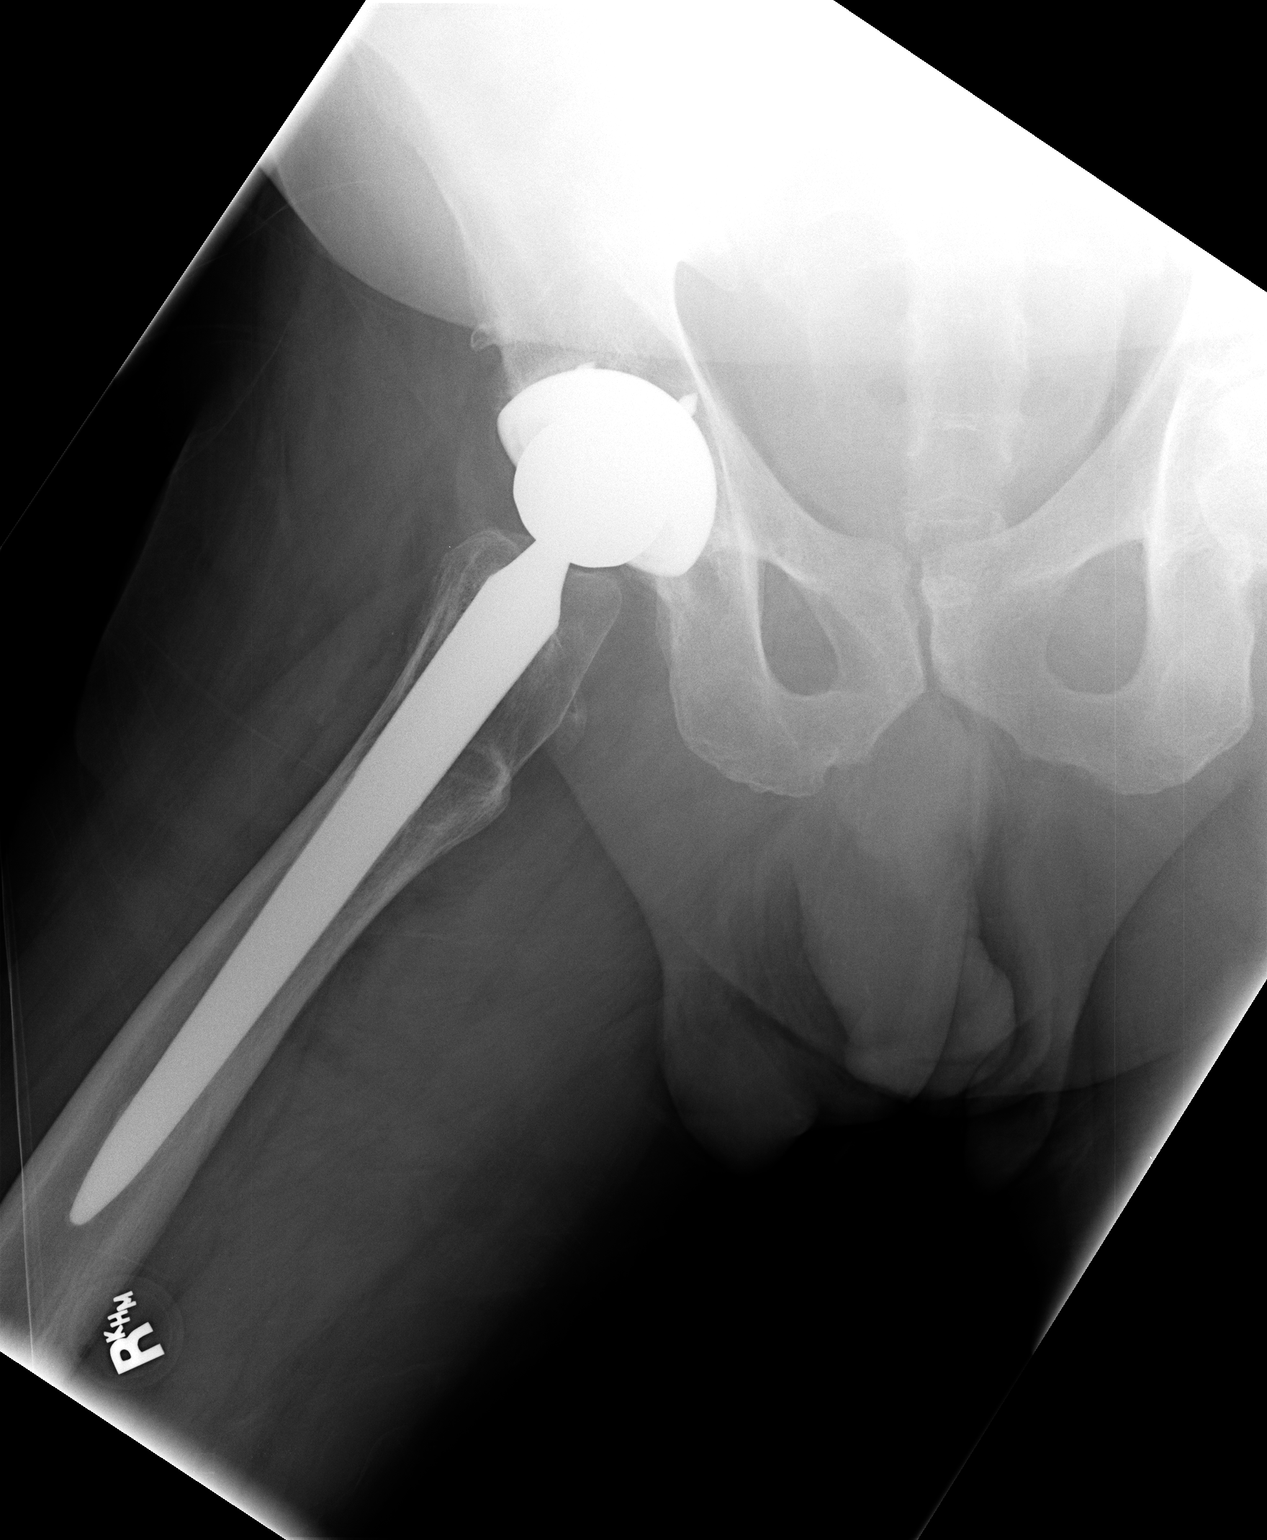

[3 of 3 positions shown; findings below may reference images not displayed]

FINDINGS: Changes of right hip replacement are stable. No hardware or bony
complicating feature. Moderate degenerative changes in the left hip.
SI joints are symmetric and unremarkable.
IMPRESSION: No acute bony abnormality.  Prior right hip replacement.

## 2016-03-14 IMAGING — CR DG ANKLE COMPLETE 3+V*L*
3 series · 3 of 3 positions shown · non-contrast
Comparison: 04/19/2011

CLINICAL DATA: Fall.  Ankle pain.

EXAM:
LEFT ANKLE COMPLETE - 3+ VIEW

[view not recorded (1 of 3)]
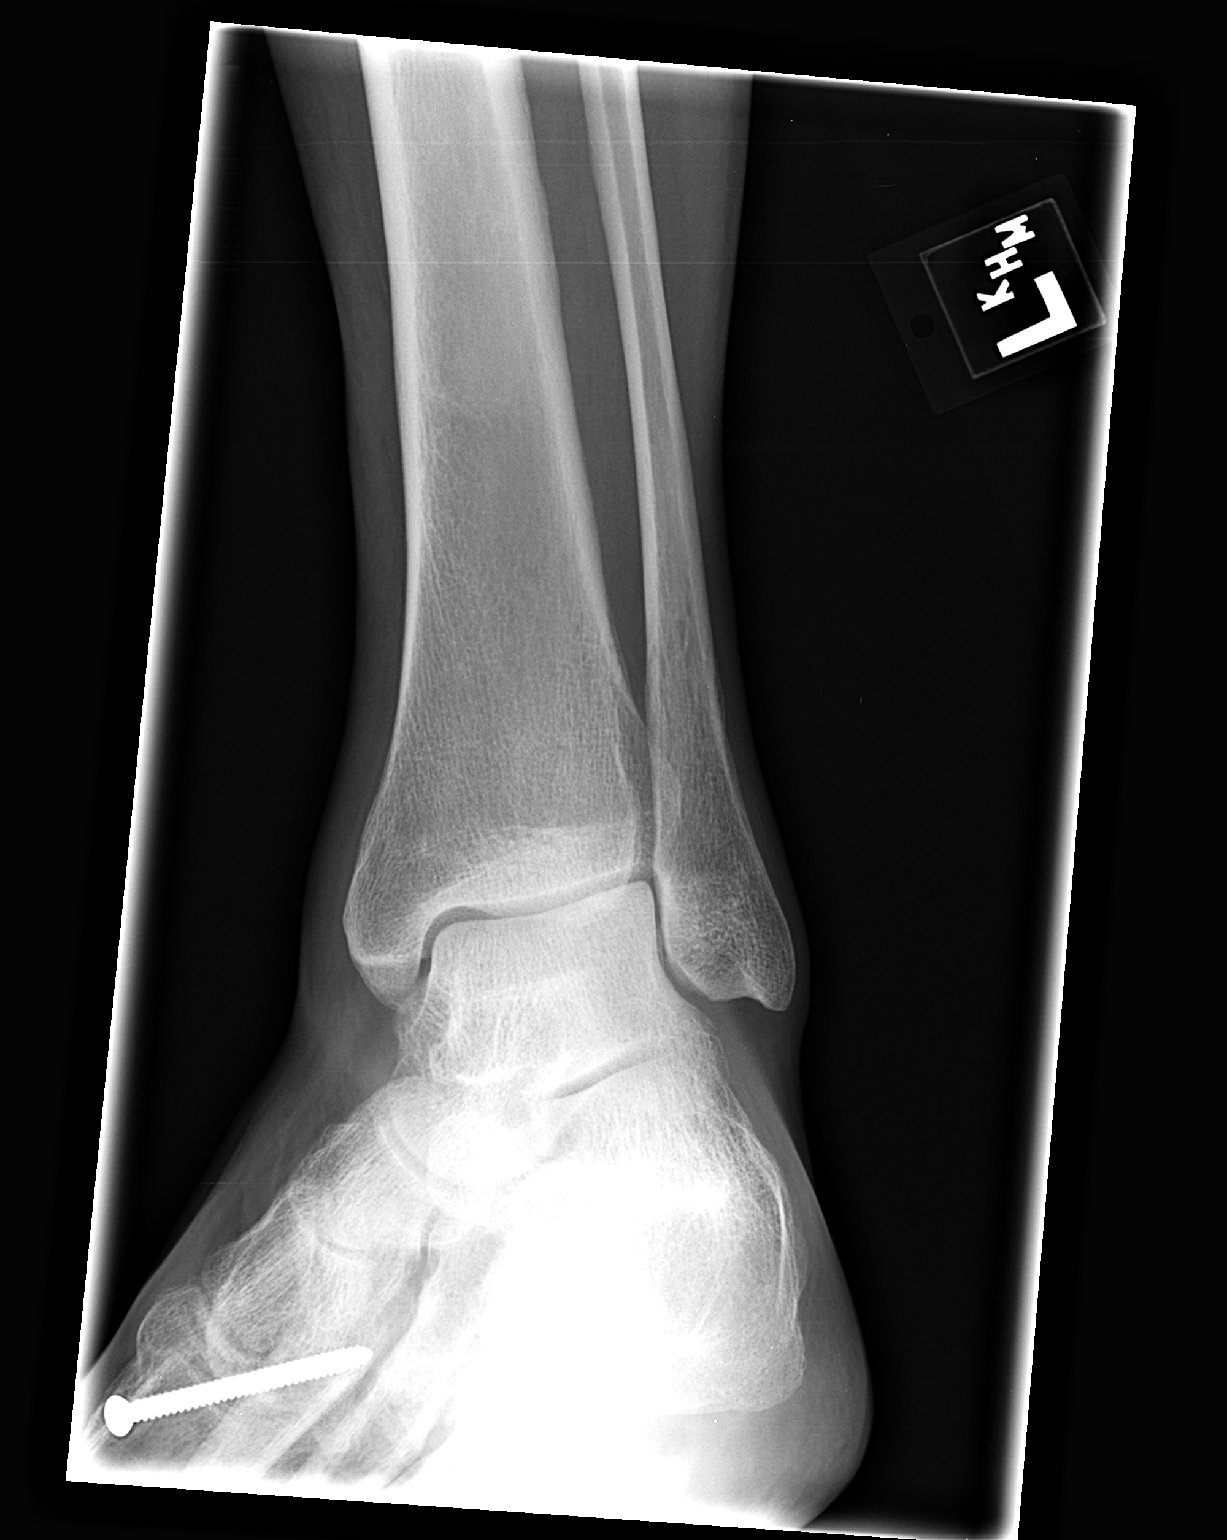

[view not recorded (2 of 3)]
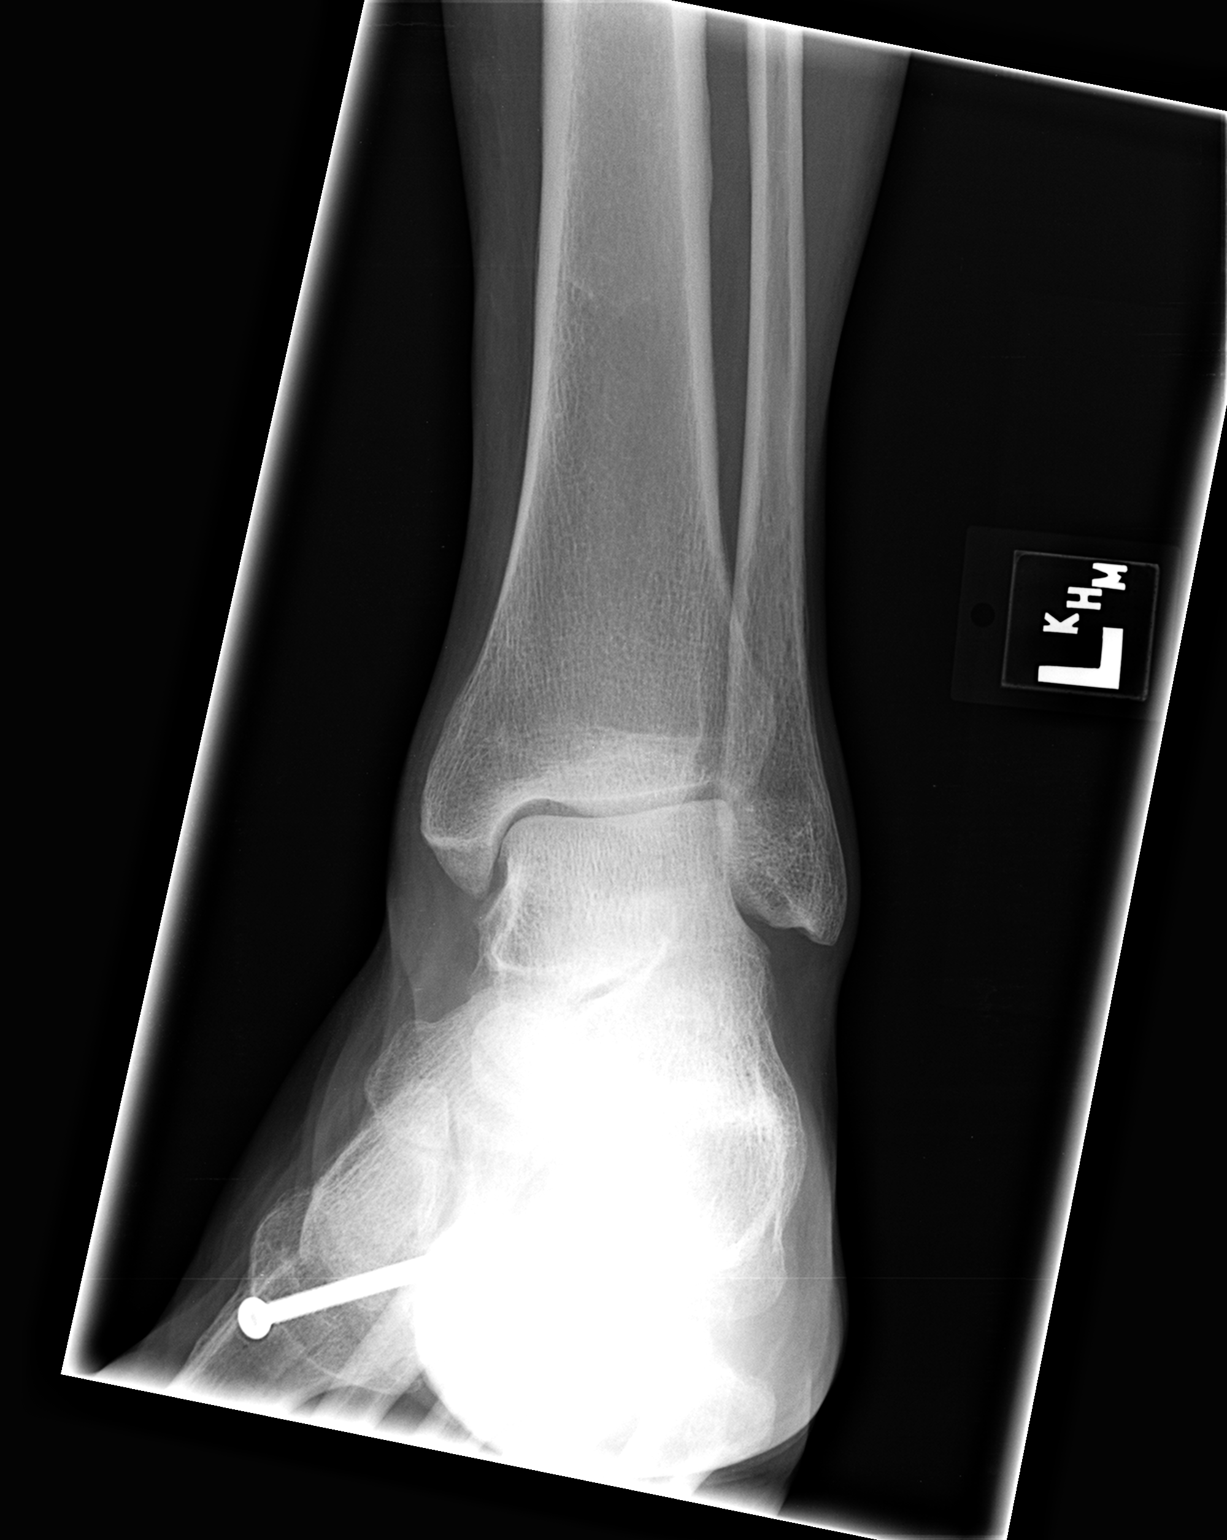

[view not recorded (3 of 3)]
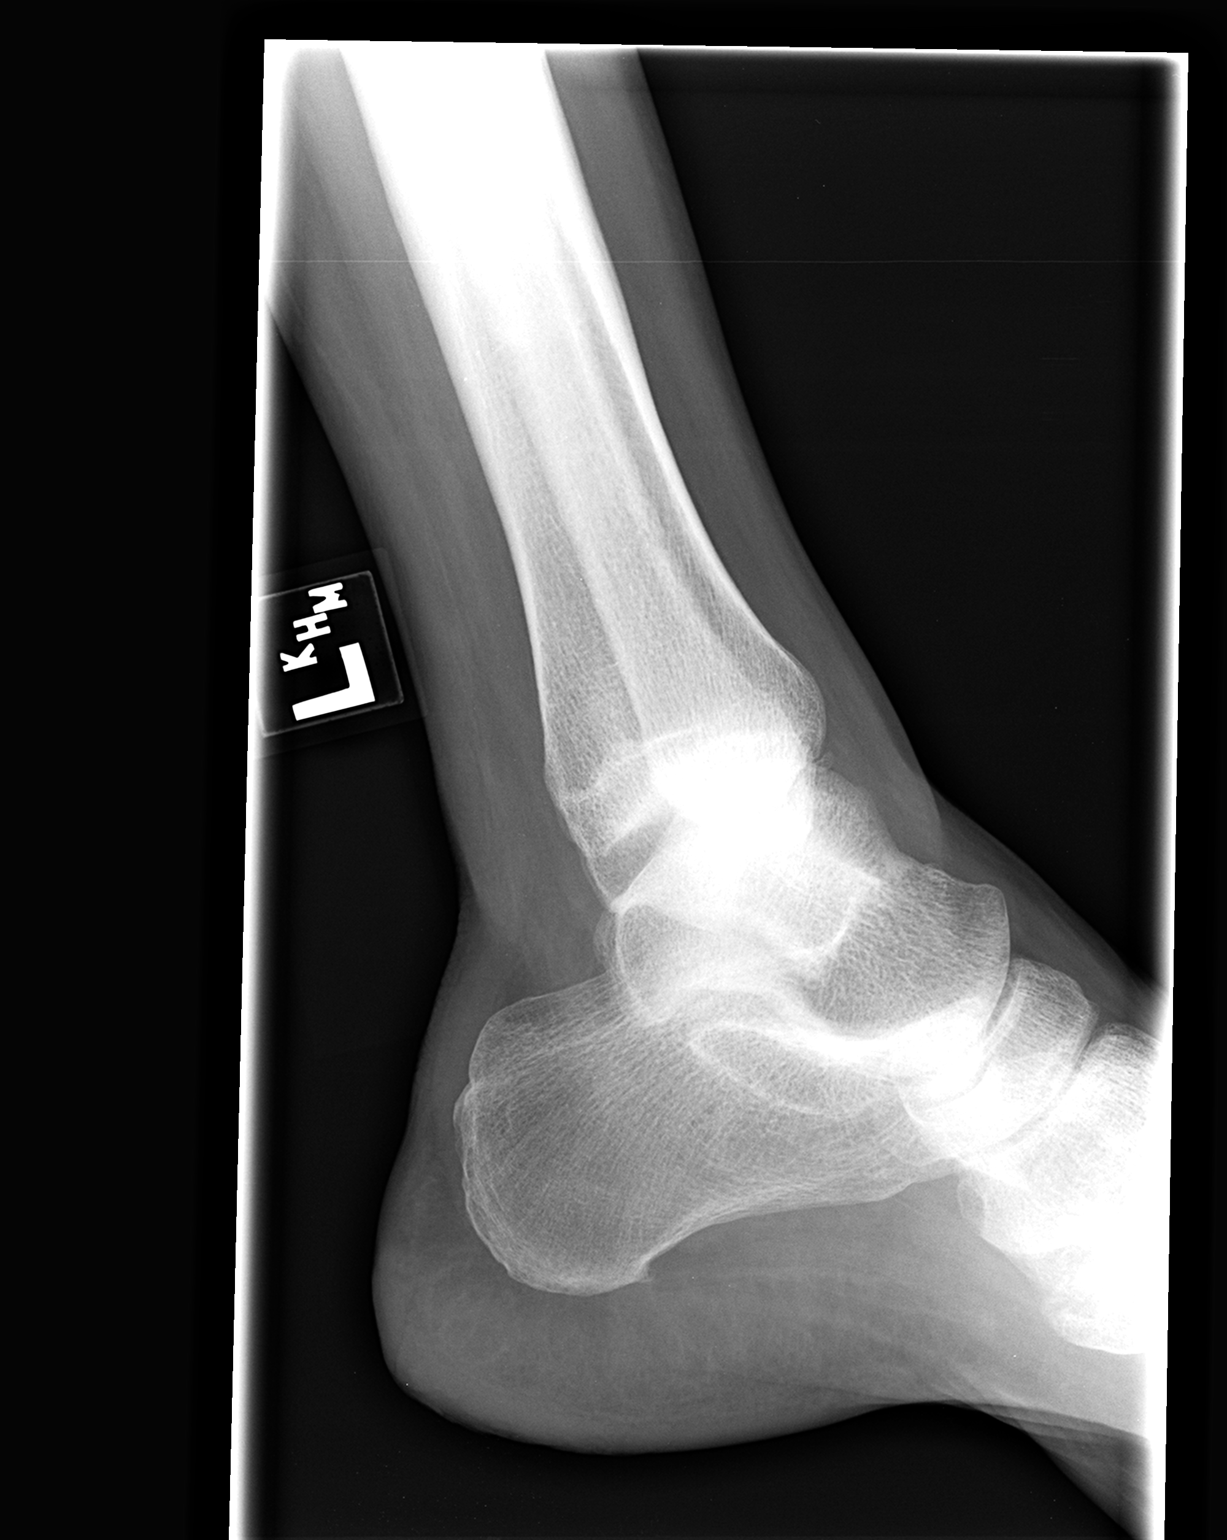

[3 of 3 positions shown; findings below may reference images not displayed]

FINDINGS: No acute bony abnormality. Specifically, no fracture, subluxation,
or dislocation. Soft tissues are intact. Joint spaces are
maintained. Normal bone mineralization.
IMPRESSION: No acute bony abnormality.

## 2016-03-26 IMAGING — CR DG FOOT COMPLETE 3+V*R*
4 series · 4 of 4 positions shown · non-contrast
Comparison: None.

CLINICAL DATA: Right foot pain

EXAM:
RIGHT FOOT COMPLETE - 3+ VIEW

[view not recorded (1 of 4)]
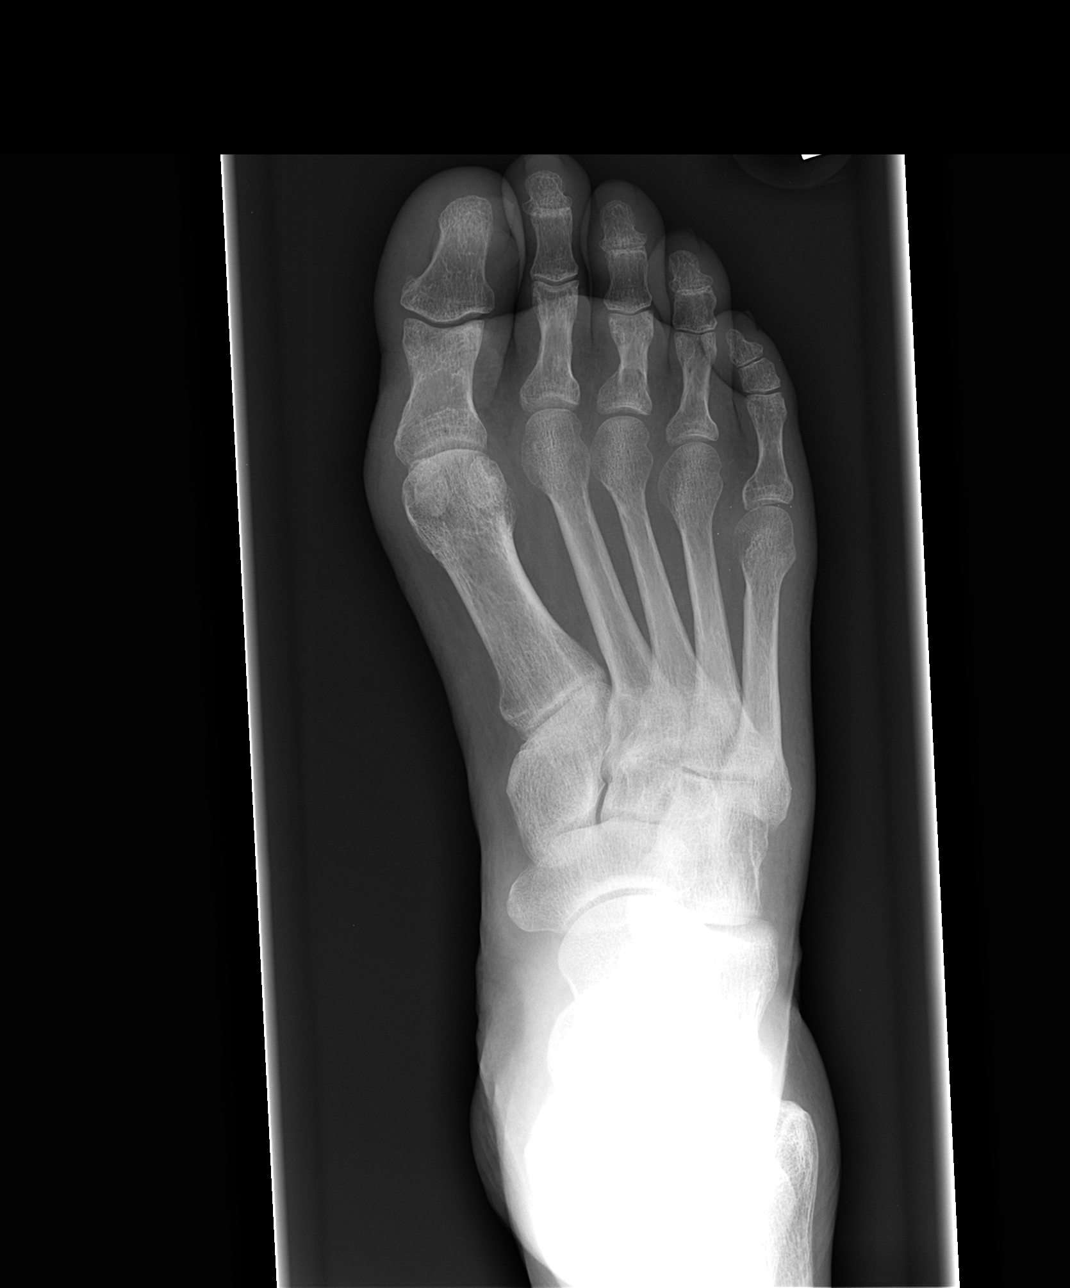

[view not recorded (2 of 4)]
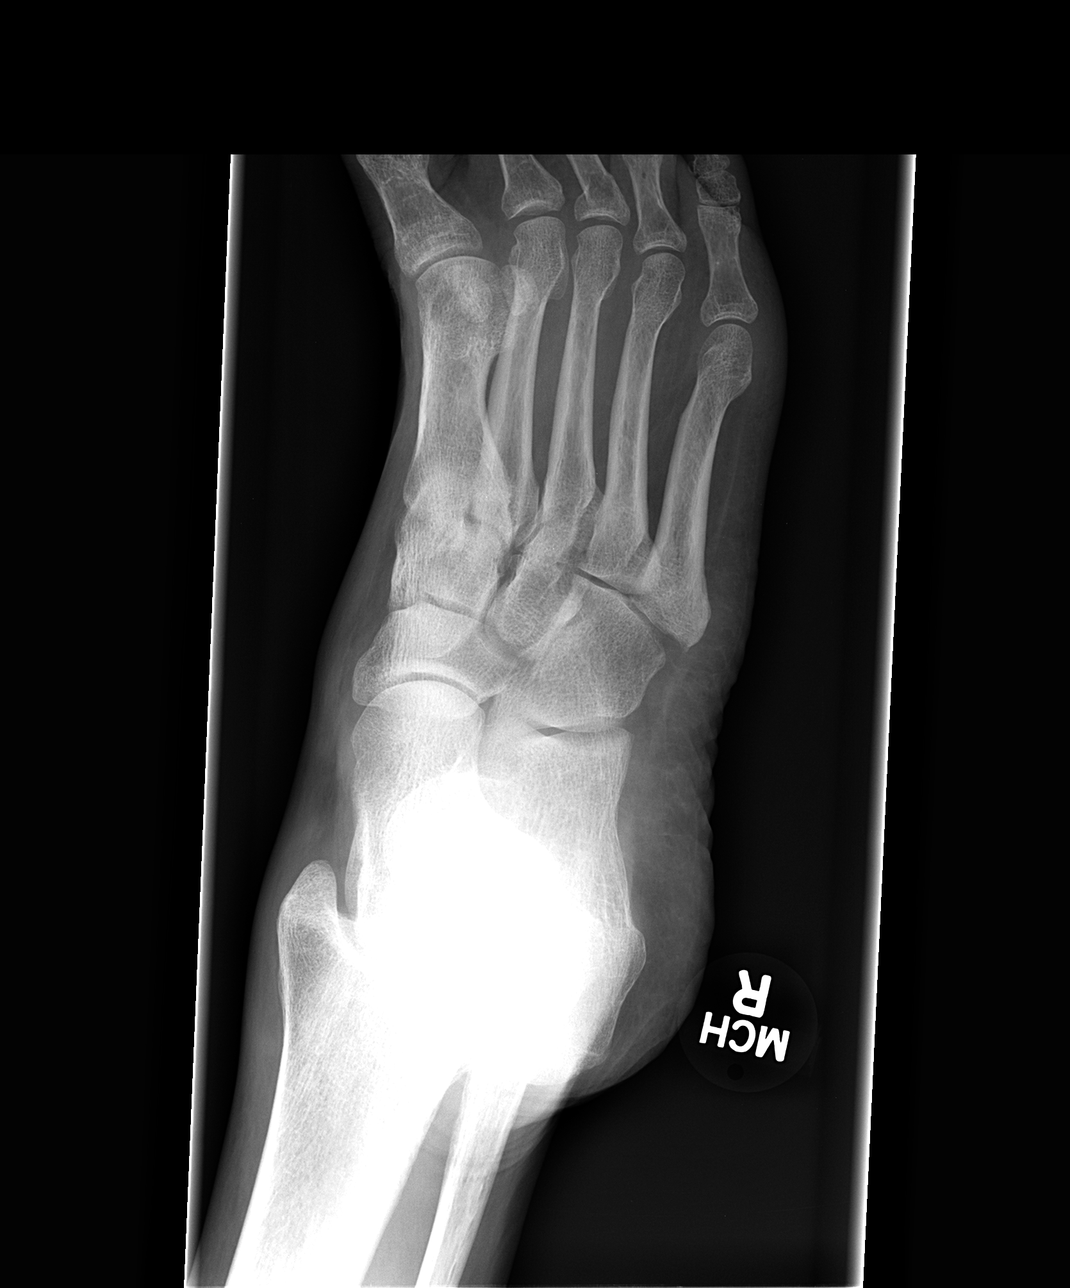

[view not recorded (3 of 4)]
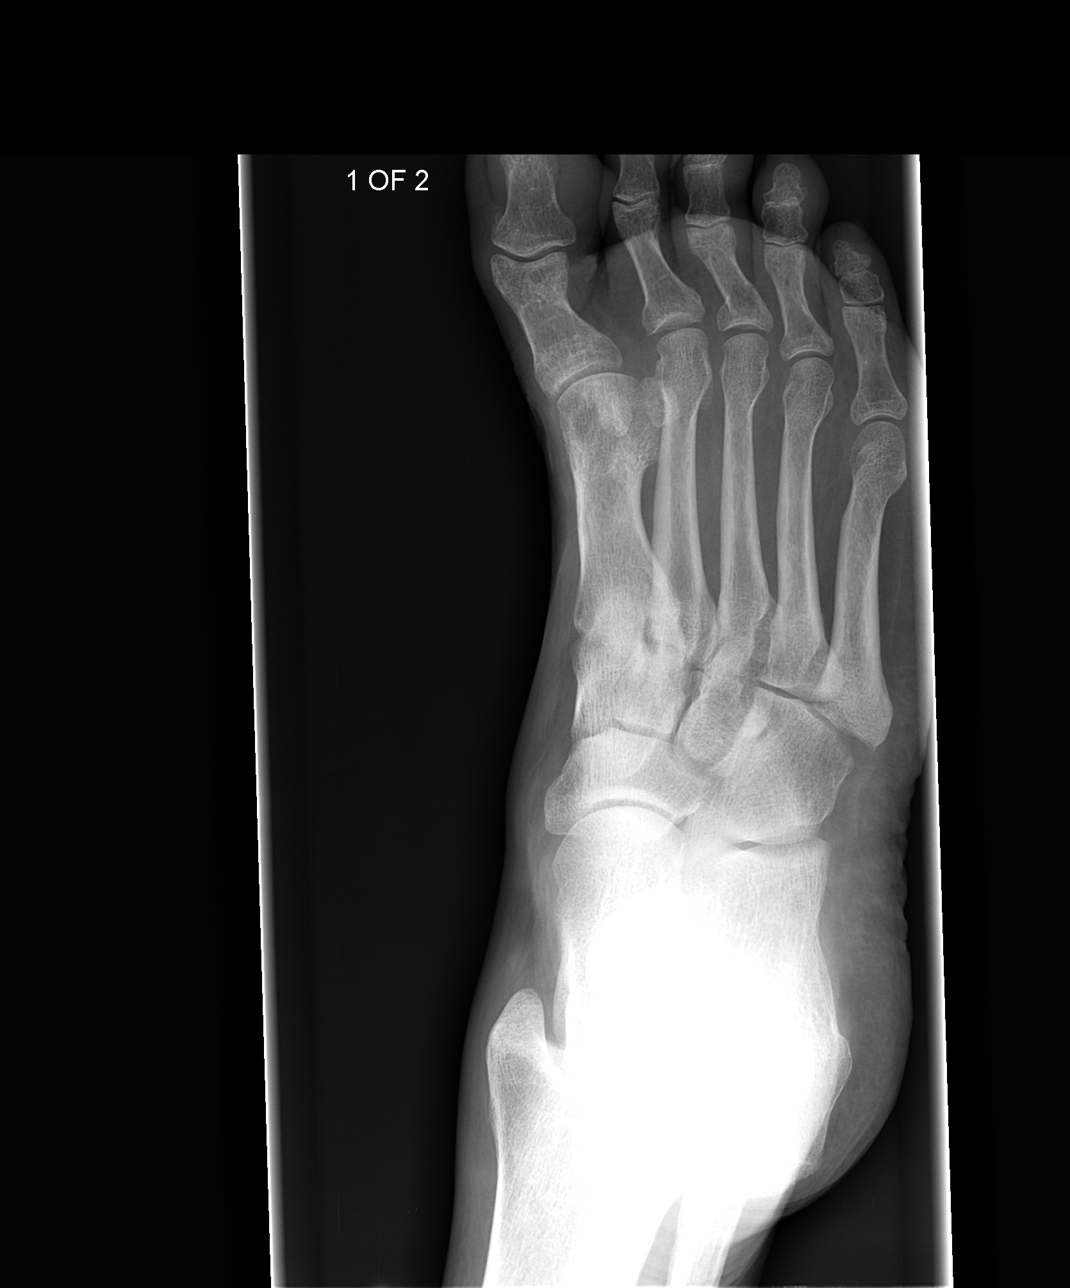

[view not recorded (4 of 4)]
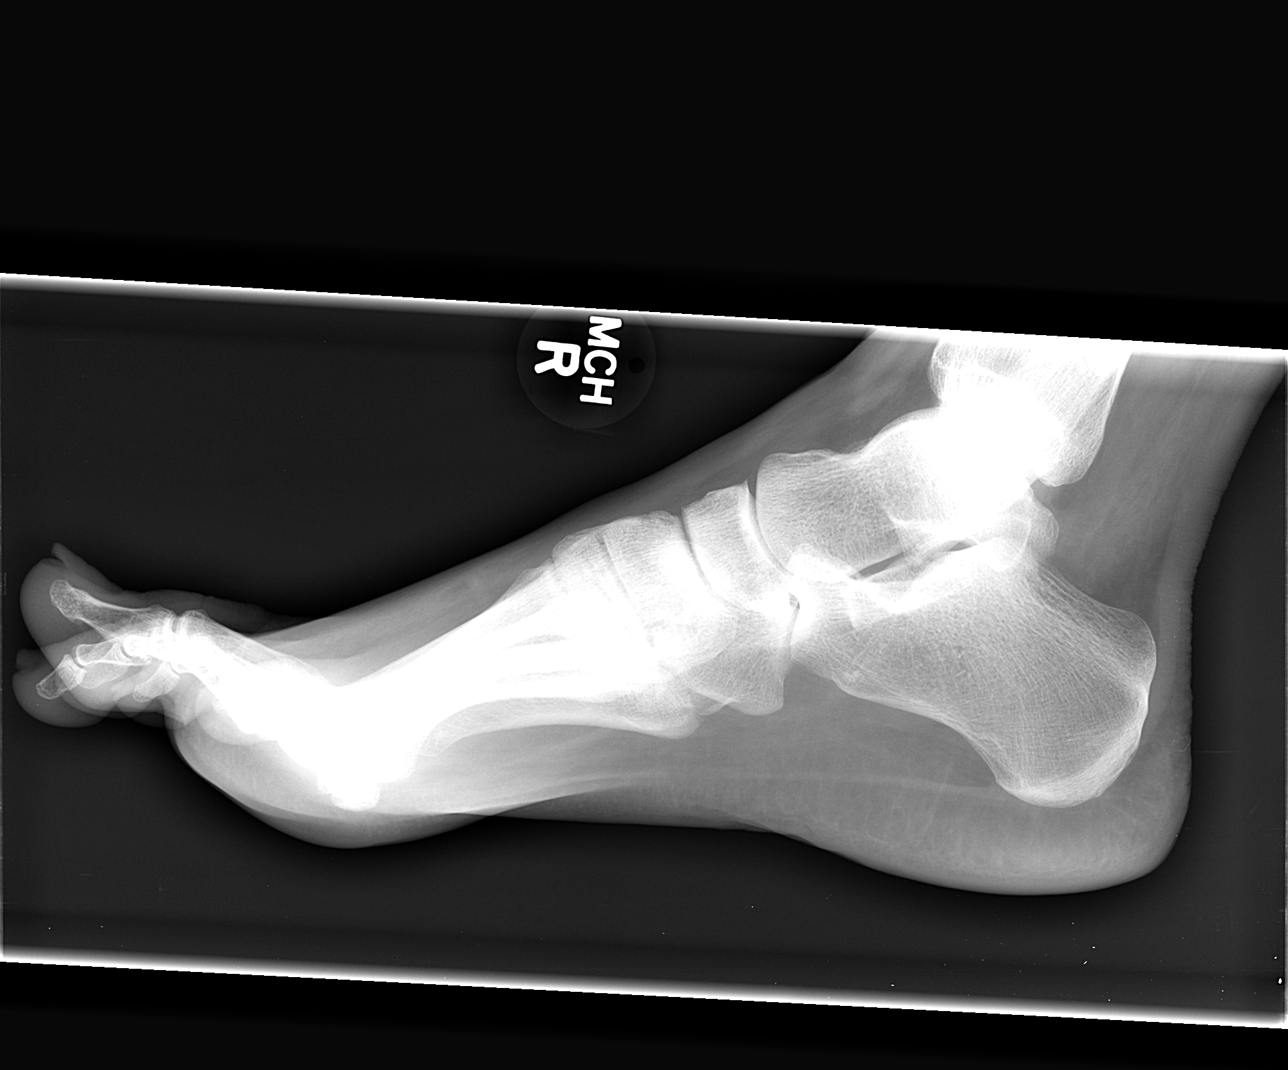

[4 of 4 positions shown; findings below may reference images not displayed]

FINDINGS: Mild hallux valgus deformity is noted. There is a mildly displaced
fracture of the third proximal phalanx. No other fracture is noted.
No gross soft tissue abnormality is seen.
IMPRESSION: Fracture of the third proximal phalanx.

## 2016-04-02 IMAGING — US US EXTREM LOW VENOUS*R*
1 series · 13 of 24 positions shown · non-contrast
Comparison: None.

CLINICAL DATA: Right lower extremity pain and edema and diabetic
ulcer.



[Series 1: us extrem low venous*right* · 0.05mm/px · 13 of 46 slices shown]
[im 1/46]
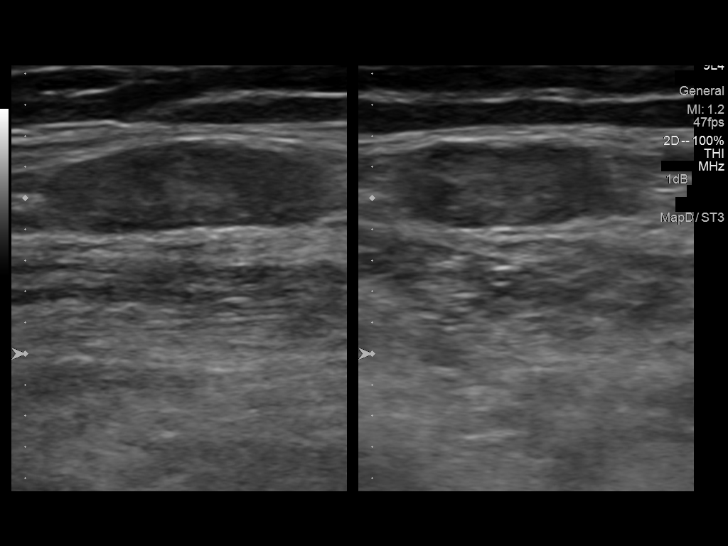
[im 4/46]
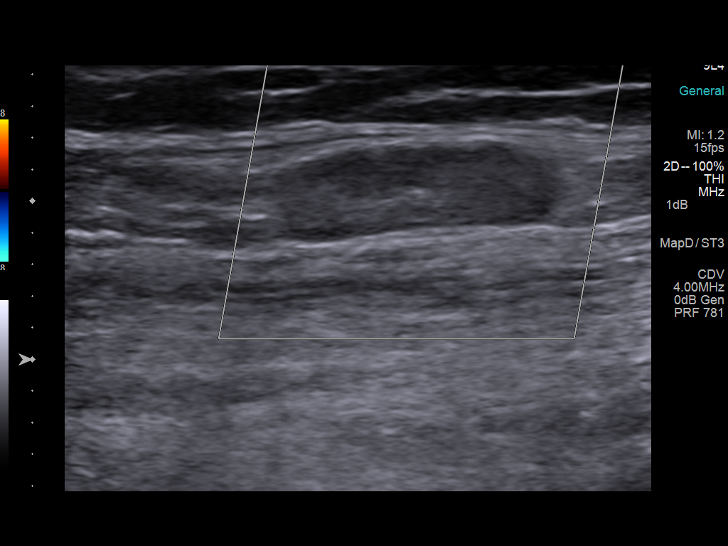
[im 8/46]
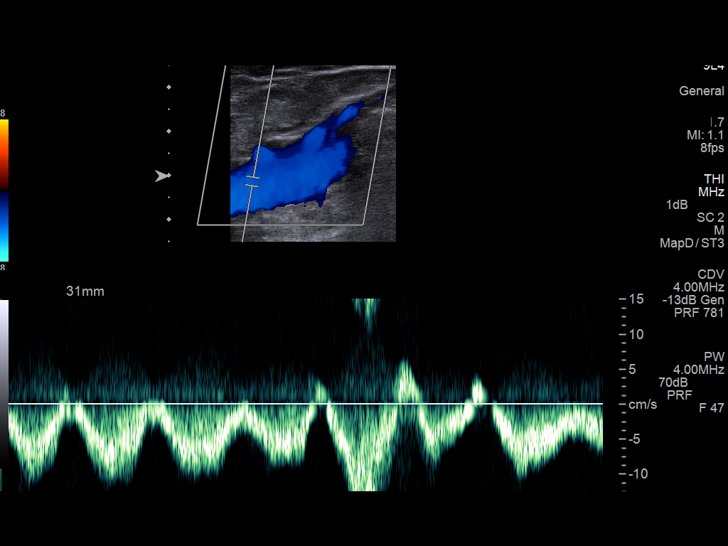
[im 12/46]
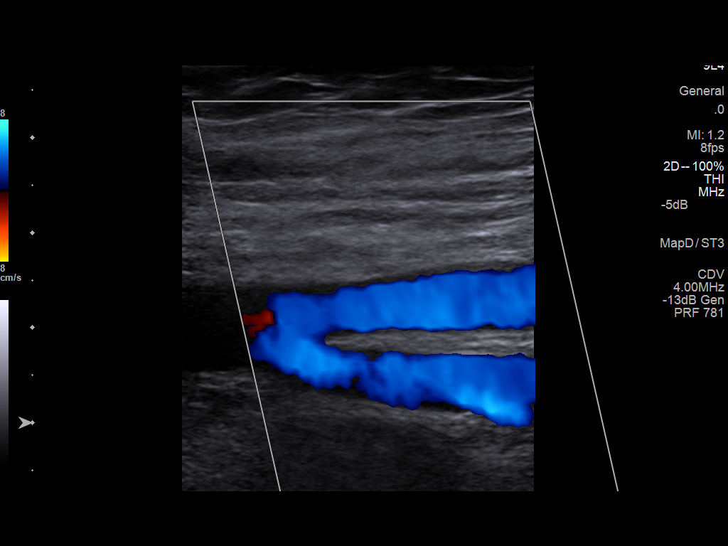
[im 16/46]
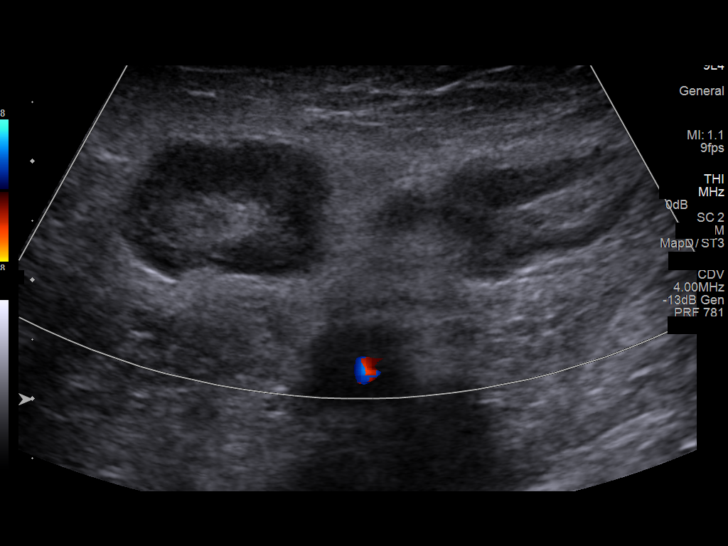
[im 20/46]
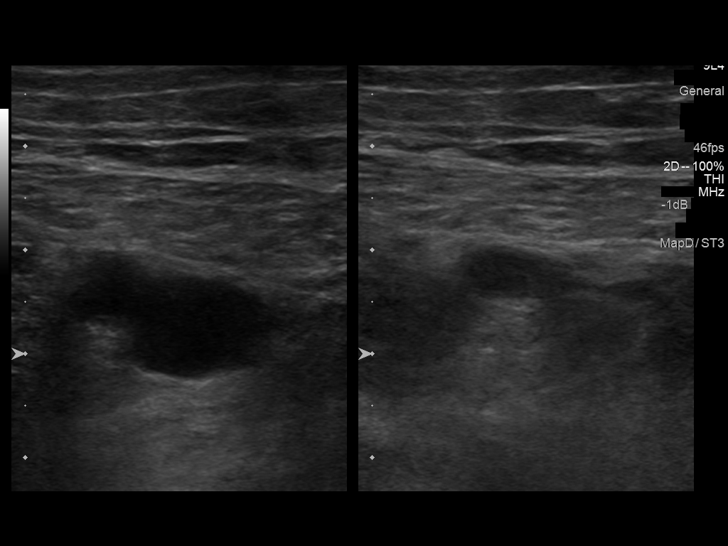
[im 24/46]
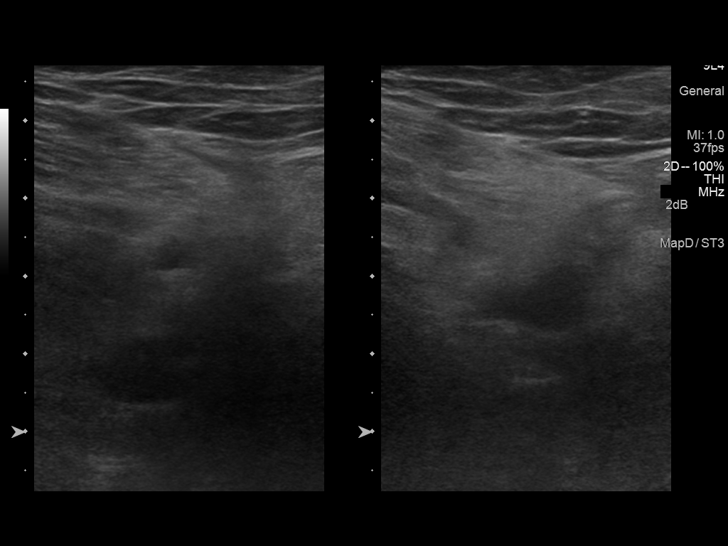
[im 26/46]
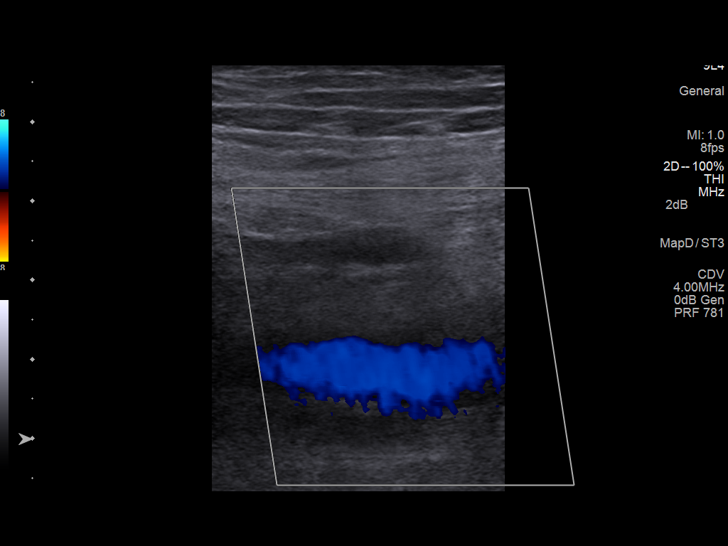
[im 30/46]
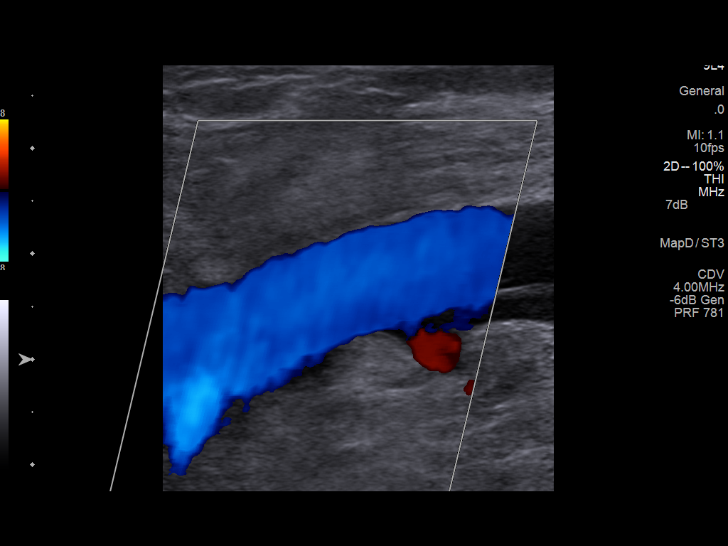
[im 34/46]
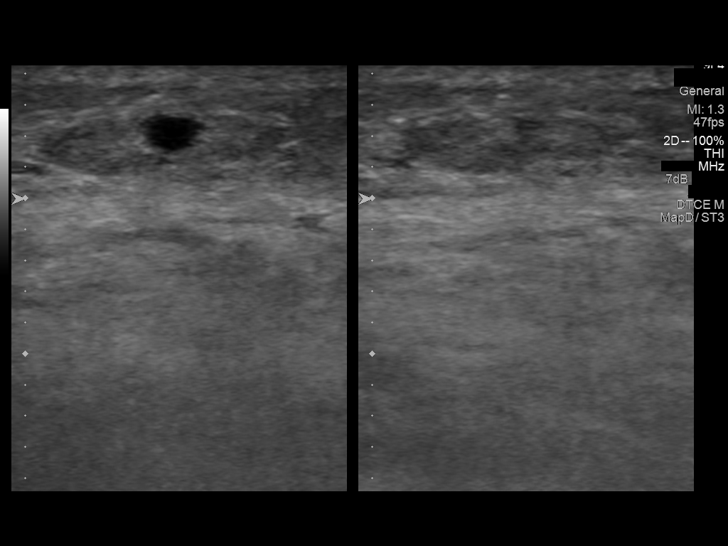
[im 38/46]
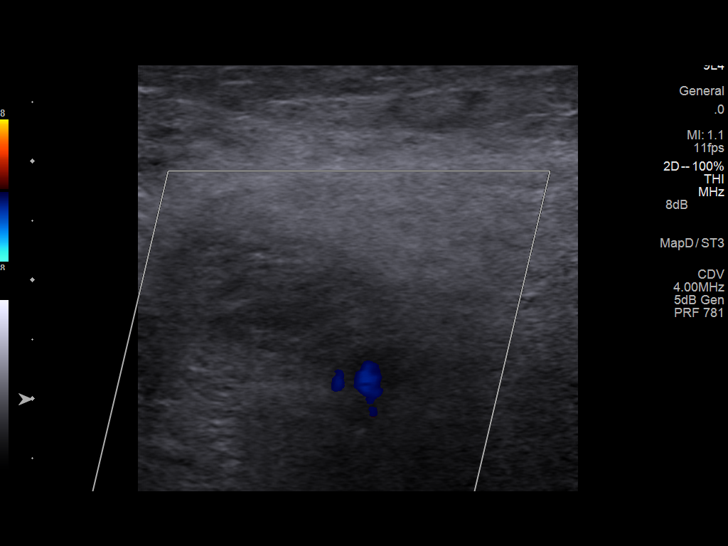
[im 42/46]
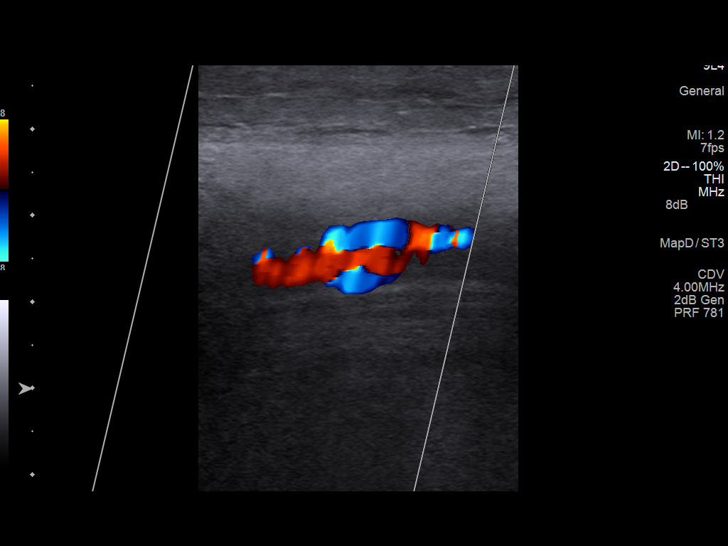
[im 46/46]
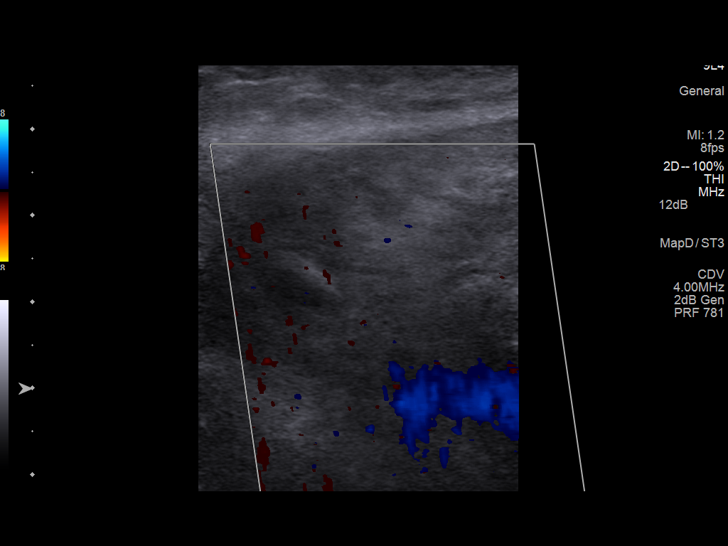

[13 of 24 positions shown; findings below may reference images not displayed]

FINDINGS: Common Femoral Vein: No evidence of thrombus. Normal
compressibility, respiratory phasicity and response to augmentation.

Saphenofemoral Junction: No evidence of thrombus. Normal
compressibility and flow on color Doppler imaging.

Profunda Femoral Vein: No evidence of thrombus. Normal
compressibility and flow on color Doppler imaging.

Femoral Vein: No evidence of thrombus. Normal compressibility,
respiratory phasicity and response to augmentation.

Popliteal Vein: No evidence of thrombus. Normal compressibility,
respiratory phasicity and response to augmentation.

Calf Veins: The tibial veins are very poorly visualized. Only a
limited portion of the posterior tibial and proximal peroneal veins
could be seen. These were not able to be well compressed do de
limited visualization.

Superficial Great Saphenous Vein: No evidence of thrombus. Normal
compressibility and flow on color Doppler imaging.

Venous Reflux:  None.

Other Findings: Diffuse subcutaneous edema is identified, especially
in the lower calf and ankle region. There is no evidence of
superficial thrombophlebitis. No focal fluid collections are
identified. Incidental note is made of some mildly prominent
right-sided inguinal lymph nodes with the largest measuring 1.9 cm
in long axis.
IMPRESSION: No evidence of right lower extremity deep venous thrombosis. Very
limited visualization and ability to compress the calf veins.

## 2016-04-15 ENCOUNTER — Encounter (HOSPITAL_COMMUNITY): Payer: Self-pay | Admitting: Emergency Medicine

## 2016-04-15 ENCOUNTER — Emergency Department (HOSPITAL_COMMUNITY): Payer: Medicare Other

## 2016-04-15 ENCOUNTER — Emergency Department (HOSPITAL_COMMUNITY)
Admission: EM | Admit: 2016-04-15 | Discharge: 2016-04-16 | Disposition: A | Discharge status: Home / Self Care | Payer: Medicare Other | Attending: Emergency Medicine | Admitting: Emergency Medicine

## 2016-04-15 DIAGNOSIS — Y9389 Activity, other specified: Secondary | ICD-10-CM | POA: Diagnosis not present

## 2016-04-15 DIAGNOSIS — Y999 Unspecified external cause status: Secondary | ICD-10-CM | POA: Insufficient documentation

## 2016-04-15 DIAGNOSIS — Z79899 Other long term (current) drug therapy: Secondary | ICD-10-CM | POA: Diagnosis not present

## 2016-04-15 DIAGNOSIS — W208XXA Other cause of strike by thrown, projected or falling object, initial encounter: Secondary | ICD-10-CM | POA: Insufficient documentation

## 2016-04-15 DIAGNOSIS — F17210 Nicotine dependence, cigarettes, uncomplicated: Secondary | ICD-10-CM | POA: Diagnosis not present

## 2016-04-15 DIAGNOSIS — Y929 Unspecified place or not applicable: Secondary | ICD-10-CM | POA: Diagnosis not present

## 2016-04-15 DIAGNOSIS — S8012XA Contusion of left lower leg, initial encounter: Secondary | ICD-10-CM | POA: Diagnosis not present

## 2016-04-15 DIAGNOSIS — Z7984 Long term (current) use of oral hypoglycemic drugs: Secondary | ICD-10-CM | POA: Insufficient documentation

## 2016-04-15 DIAGNOSIS — S8992XA Unspecified injury of left lower leg, initial encounter: Secondary | ICD-10-CM | POA: Diagnosis present

## 2016-04-15 DIAGNOSIS — S8010XA Contusion of unspecified lower leg, initial encounter: Secondary | ICD-10-CM

## 2016-04-15 DIAGNOSIS — E119 Type 2 diabetes mellitus without complications: Secondary | ICD-10-CM | POA: Diagnosis not present

## 2016-04-15 MED ORDER — OXYCODONE-ACETAMINOPHEN 5-325 MG PO TABS: 1.0000 | ORAL_TABLET | ORAL | 0 refills | Status: DC | PRN

## 2016-04-15 MED ORDER — HYDROMORPHONE HCL 1 MG/ML IJ SOLN
0.5000 mg | Freq: Once | INTRAMUSCULAR | Status: AC
  Administered 2016-04-15: 0.5 mg via INTRAVENOUS
  Filled 2016-04-15: qty 1

## 2016-04-15 NOTE — ED Triage Notes (Signed)
Patient brought in by EMS states he was working on a transmission on a table and it fell landing on his left lower leg. Patient has small wound and significant swelling noted to left lower leg. Per EMS, patient was given morphine 4mg  IV en route to ER.

## 2016-04-15 NOTE — ED Notes (Signed)
Patient came into ER with IV in place in right hand. Per EMS, IV is no longer working. IV site has some slight swelling noted above IV placement. IV removed from right hand and placed new one in right AC.

## 2016-04-15 NOTE — ED Notes (Signed)
Placed ice pack on patient's lower left leg.

## 2016-04-15 NOTE — ED Provider Notes (Signed)
[facility] DEPT Provider Note   CSN: [id number] Arrival date & time: 04/15/16  2153  By signing my name below, I, Margit Banda, attest that this documentation has been prepared under the direction and in the presence of Davonna Belling, MD. Electronically Signed: Margit Banda, ED Scribe. 04/15/16. 10:15 PM.   History   Chief Complaint Chief Complaint  Patient presents with  . Leg Injury    HPI [name] is a 51 y.o. male BIB EMS who presents to the Emergency Department complaining of a wound to his left lower leg s/p dropping a transmission on himself earlier today (04/15/16). Pt reports being able to pick the transmission up and put it back on the table. Pt was given morphine 4 mg IV en route with mild relief. Pt is not currently on anticoagulant or antiplatelet therapy. Pt denies fevers, chills, nausea, vomiting and diarrhea.  The history is provided by the patient and medical records. No language interpreter was used.    Past Medical History:  Diagnosis Date  . Anxiety   . Arthritis    "everywhere" (04/21/2012)  . Cellulitis and abscess of foot 12/19/2013   LEFT FOOT  . Chronic pain   . DDD (degenerative disc disease)   . Depression   . Diabetes mellitus without complication (HCC)    borderline  . Diabetic foot ulcer ([place])   . ETOH abuse   . GERD (gastroesophageal reflux disease)    tums  . History of blood transfusion    "related to left knee OR; probably right hip too" (04/21/2012)  . Mental disorder   . Neuromuscular disorder (HCC)    neuropathy  . Noncompliance   . Open wound    bottom of foot  . Pneumonia ~ 2012  . Polysubstance abuse    etoh, cocaine, heroin  . Stroke [facility]) 2008   "they said I might have had one during right hip replacement" (04/21/2012)    Patient Active Problem List   Diagnosis Date Noted  . Tobacco use disorder 10/25/2014  . Diabetes mellitus type 2 in nonobese (HCC)   . Swelling   . Type 2 diabetes mellitus with left  diabetic foot ulcer ([place]) 12/18/2013  . Diabetes mellitus due to underlying condition with foot ulcer ([place]) 12/18/2013  . Left leg cellulitis 12/18/2013  . Cellulitis of right lower extremity 07/19/2013  . Cocaine abuse 03/29/2012  . Generalized anxiety disorder 03/29/2012  . Panic attacks 03/29/2012  . Alcohol dependence ([place]) 08/05/2011    Class: Chronic  . Substance abuse 05/31/2011  . Foot ulcer, left ([place]) 02/15/2011  . Fasting hyperglycemia 02/15/2011  . Neuropathic ulcer of foot ([place]) 02/15/2011    Past Surgical History:  Procedure Laterality Date  . JOINT REPLACEMENT    . KNEE ARTHROSCOPY Bilateral 1980's/1990's  . LUNG LOBECTOMY Left ~ 2006  . LUNG LOBECTOMY    . METATARSAL OSTEOTOMY  10/29/2011   Procedure: METATARSAL OSTEOTOMY;  Surgeon: [name], MD;  Location: [facility];  Service: Orthopedics;  Laterality: Left;  Left 1st Metatarsal Dorsal Closing Wedge   . REVISION TOTAL HIP ARTHROPLASTY Right 2008   "4-5 months after replacement" (04/21/2012)  . TOTAL HIP ARTHROPLASTY Right 2008  . TOTAL KNEE ARTHROPLASTY Left 2006  . TOTAL KNEE ARTHROPLASTY Right 04/20/2012  . TOTAL KNEE ARTHROPLASTY Right 04/20/2012   Procedure: TOTAL KNEE ARTHROPLASTY;  Surgeon: [name], MD;  Location: [facility];  Service: Orthopedics;  Laterality: Right;  Right Total Knee Arthroplasty  Home Medications    Prior to Admission medications   Medication Sig Start Date End Date Taking? Authorizing Provider  clonazePAM (KLONOPIN) 1 MG tablet Take 1 mg by mouth 4 (four) times daily.   Yes Historical Provider, MD  escitalopram (LEXAPRO) 20 MG tablet Take 1 tablet (20 mg total) by mouth every morning. For depression Patient taking differently: Take 40 mg by mouth every morning. For depression 03/29/13  Yes Encarnacion Slates, NP  gabapentin (NEURONTIN) 400 MG capsule Take 1 capsule (400 mg total) by mouth 4 (four) times daily. For substance withdrawal syndrome 03/29/13  Yes Encarnacion Slates, NP  metFORMIN  (GLUCOPHAGE) 500 MG tablet Take 1 tablet (500 mg total) by mouth 2 (two) times daily with a meal. 01/15/14  Yes Philemon Kingdom, MD  mirtazapine (REMERON) 30 MG tablet Take 30 mg by mouth at bedtime.   Yes Historical Provider, MD  QUEtiapine (SEROQUEL) 200 MG tablet Take 1 tablet (200 mg total) by mouth at bedtime. For mood control 03/29/13  Yes Encarnacion Slates, NP  oxyCODONE-acetaminophen (PERCOCET/ROXICET) 5-325 MG tablet Take 1-2 tablets by mouth every 4 (four) hours as needed for severe pain. 04/15/16   Davonna Belling, MD    Family History Family History  Problem Relation Age of Onset  . Diabetes Mother   . Diabetes Father     Social History Social History  Substance Use Topics  . Smoking status: Current Every Day Smoker    Packs/day: 1.00    Years: 30.00    Types: Cigarettes  . Smokeless tobacco: Never Used     Comment: 04/21/2012 offered smoking cessation materials; pt declines  12/19/2013 DECLINES SMOKING CESSATION MATERIALS  . Alcohol use 0.0 oz/week     Comment: occasionally     Allergies   Benadryl [diphenhydramine hcl] and Trazodone and nefazodone   Review of Systems Review of Systems  Constitutional: Negative for chills and fever.  Gastrointestinal: Negative for diarrhea, nausea and vomiting.  Skin: Positive for wound (laceration).     Physical Exam Updated Vital Signs BP 114/80 (BP Location: Right Arm)   Pulse 70   Temp 97.7 F (36.5 C) (Oral)   Resp 20   Ht 6\' 3"  (1.905 m)   Wt 220 lb (99.8 kg)   SpO2 97%   BMI 27.50 kg/m   Physical Exam  Constitutional: He is oriented to person, place, and time. He appears well-developed and well-nourished. No distress.  HENT:  Head: Normocephalic and atraumatic.  Right Ear: Hearing normal.  Left Ear: Hearing normal.  Nose: Nose normal.  Mouth/Throat: Oropharynx is clear and moist and mucous membranes are normal.  Eyes: Conjunctivae and EOM are normal. Pupils are equal, round, and reactive to light.  Neck:  Normal range of motion. Neck supple.  Cardiovascular: Regular rhythm, S1 normal and S2 normal.  Exam reveals no gallop and no friction rub.   No murmur heard. Pulmonary/Chest: Effort normal and breath sounds normal. No respiratory distress. He exhibits no tenderness.  Lungs are clear.  Abdominal: Soft. Normal appearance and bowel sounds are normal. There is no hepatosplenomegaly. There is no tenderness. There is no rebound, no guarding, no tenderness at McBurney's point and negative Murphy's sign. No hernia.  Musculoskeletal: Normal range of motion.  Hematoma 10 cm over left anterior mid tibia. Central .5 cm laceration. No deformity. Baseline movement in feet which is decreased.  Neurological: He is alert and oriented to person, place, and time. He has normal strength. No cranial nerve deficit or sensory  deficit. Coordination normal. GCS eye subscore is 4. GCS verbal subscore is 5. GCS motor subscore is 6.  Skin: Skin is warm, dry and intact. No rash noted. No cyanosis.  Psychiatric: He has a normal mood and affect. His speech is normal and behavior is normal. Thought content normal.  Nursing note and vitals reviewed.    ED Treatments / Results   COORDINATION OF CARE: 10:10 PM-Discussed next steps with pt. Pt verbalized understanding and is agreeable with the plan.   Labs (all labs ordered are listed, but only abnormal results are displayed) Labs Reviewed - No data to display  EKG  EKG Interpretation None       Radiology Dg Tibia/fibula Left  Result Date: 04/15/2016 CLINICAL DATA:  Injury to anterior left mid tibia and fibula. EXAM: LEFT TIBIA AND FIBULA - 2 VIEW COMPARISON:  05/21/2014 left knee radiographs. FINDINGS: No acute fracture or dislocation identified. Soft tissue swelling inferior to the mid tibia. Total knee arthroplasty without apparent hardware related complication or periprosthetic lucency. Ankle joint is well maintained. Tiny plantar calcaneal enthesophyte. Right  first metatarsal screw partially visualize. IMPRESSION: No acute fracture or dislocation identified. Soft tissue swelling inferior to the mid tibia. Electronically Signed   By: Kristine Garbe M.D.   On: 04/15/2016 22:55    Procedures Procedures (including critical care time)  Medications Ordered in ED Medications  HYDROmorphone (DILAUDID) injection 0.5 mg (not administered)  HYDROmorphone (DILAUDID) injection 0.5 mg (0.5 mg Intravenous Given 04/15/16 2218)     Initial Impression / Assessment and Plan / ED Course  I have reviewed the triage vital signs and the nursing notes.  Pertinent labs & imaging results that were available during my care of the patient were reviewed by me and considered in my medical decision making (see chart for details).     Patient with contusion to lower leg. Has some swelling. Does not appear to have compartment syndrome. X-ray does not show fracture. Will give crutches for comfort. Will have follow-up with orthopedic surgery. Discharge home as needed. With diabetes he is at high risk for infection.  Final Clinical Impressions(s) / ED Diagnoses   Final diagnoses:  Contusion of lower leg, initial encounter    New Prescriptions New Prescriptions   OXYCODONE-ACETAMINOPHEN (PERCOCET/ROXICET) 5-325 MG TABLET    Take 1-2 tablets by mouth every 4 (four) hours as needed for severe pain.   I personally performed the services described in this documentation, which was scribed in my presence. The recorded information has been reviewed and is accurate.       Davonna Belling, MD 04/15/16 [phone]

## 2016-04-28 IMAGING — CR DG FEMUR 2+V*R*
4 series · 4 of 4 positions shown · non-contrast
Comparison: RIGHT femur radiographs October 04, 2013

CLINICAL DATA: RIGHT knee gave out and fell on brick sidewalk
tonight. Pain. History of knee replacement.

EXAM:
PELVIS - 1-2 VIEW; RIGHT FEMUR - 2 VIEW

[view not recorded (1 of 4)]
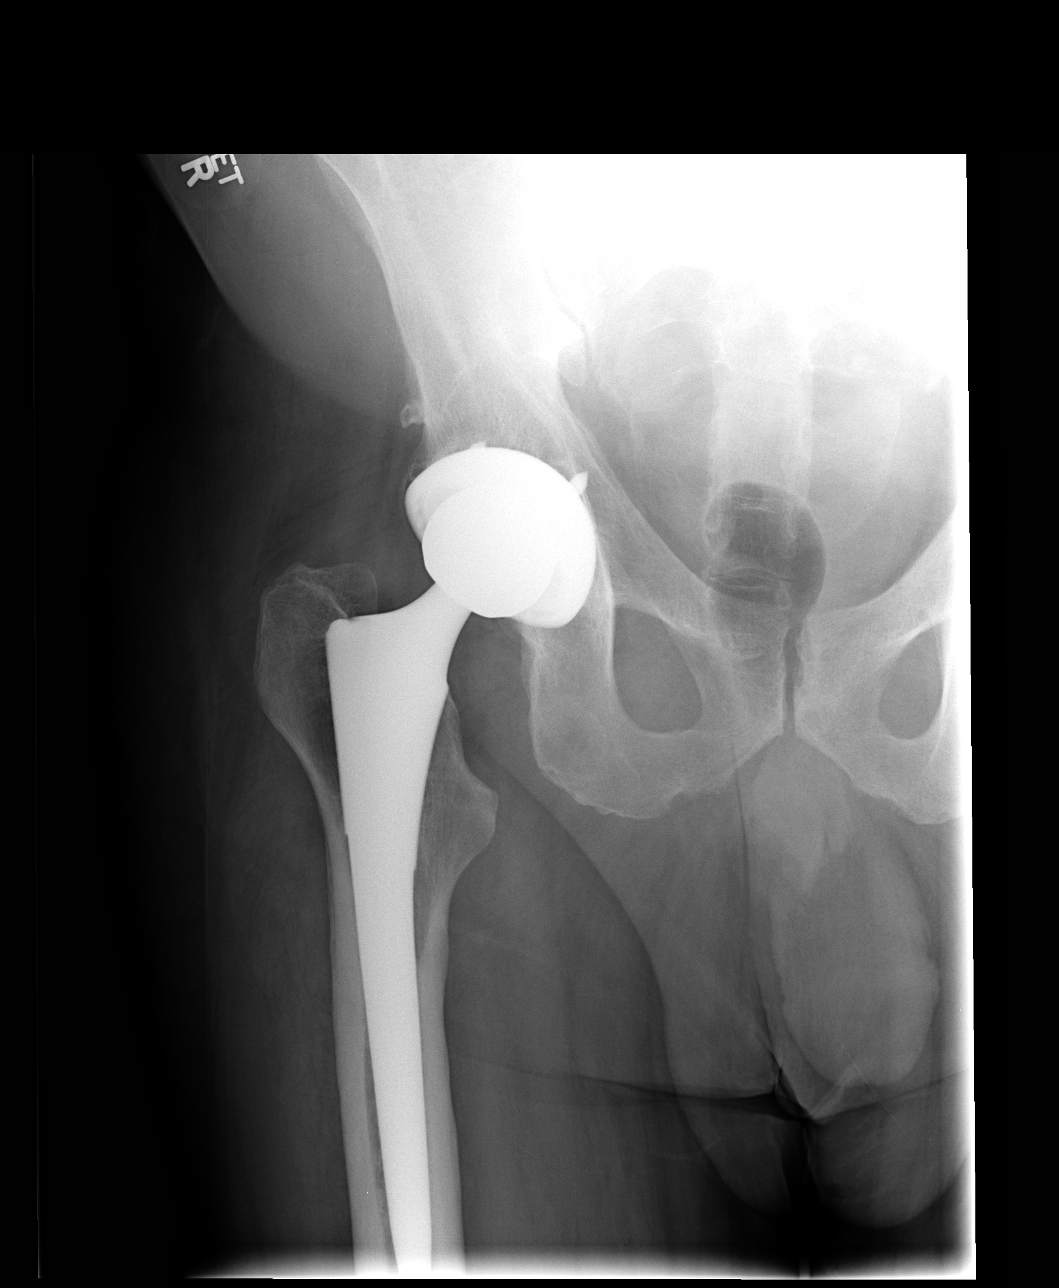

[view not recorded (2 of 4)]
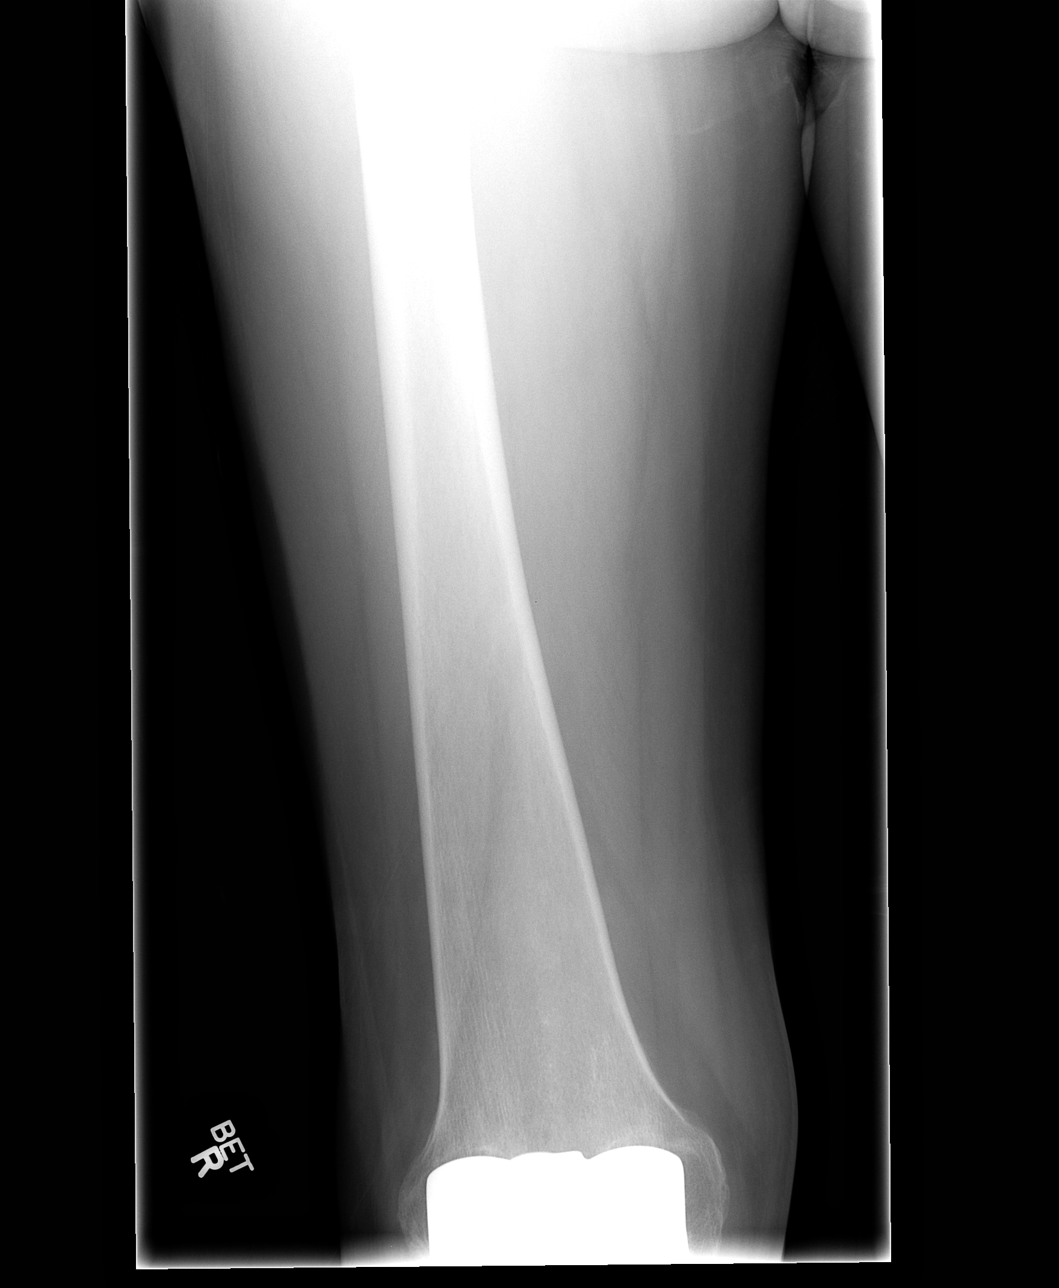

[view not recorded (3 of 4)]
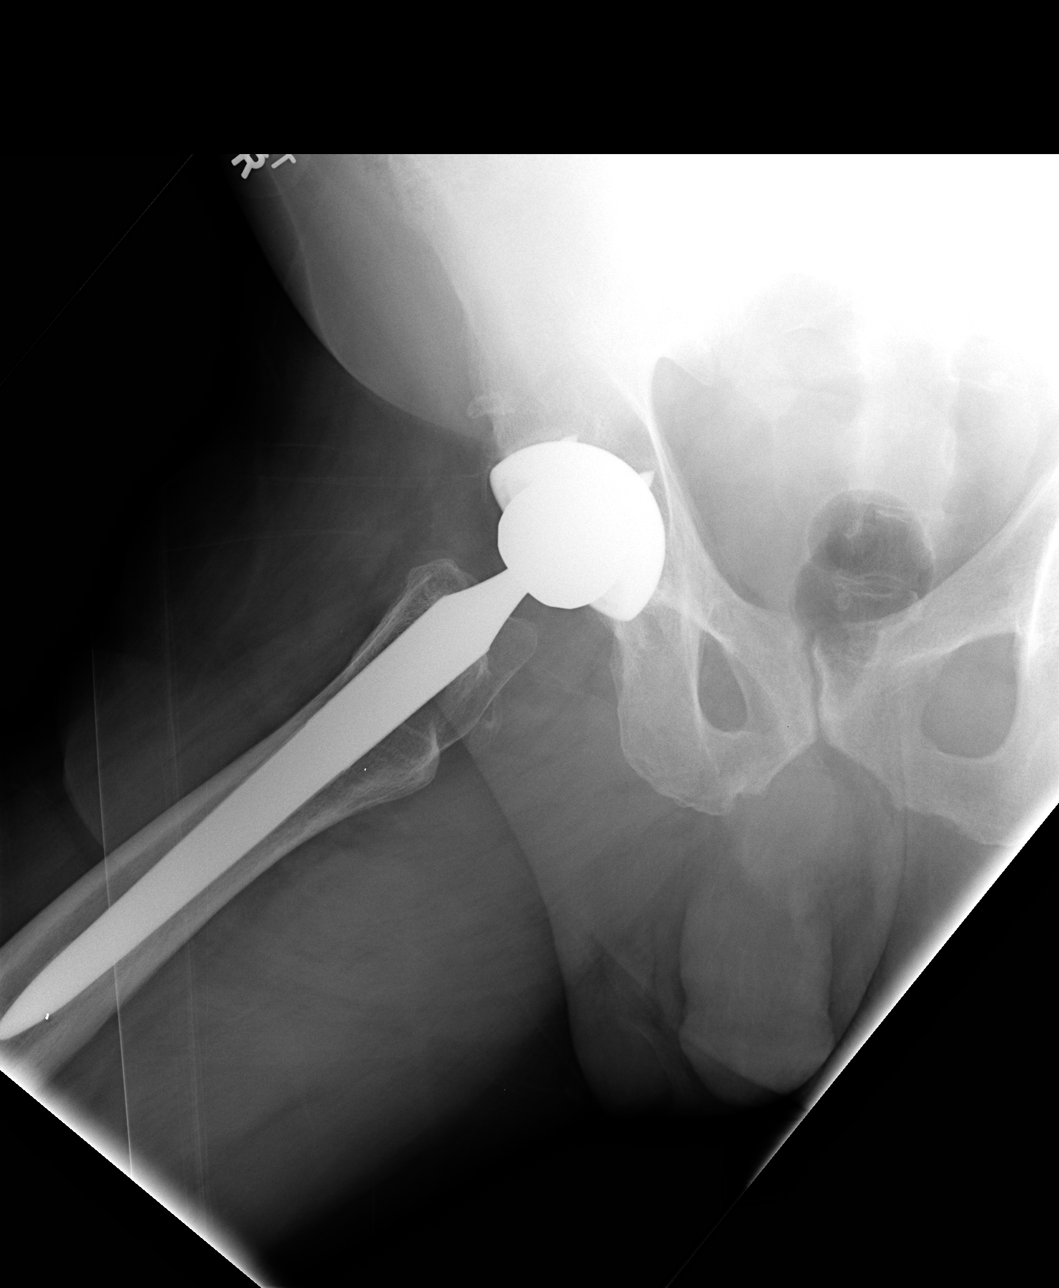

[view not recorded (4 of 4)]
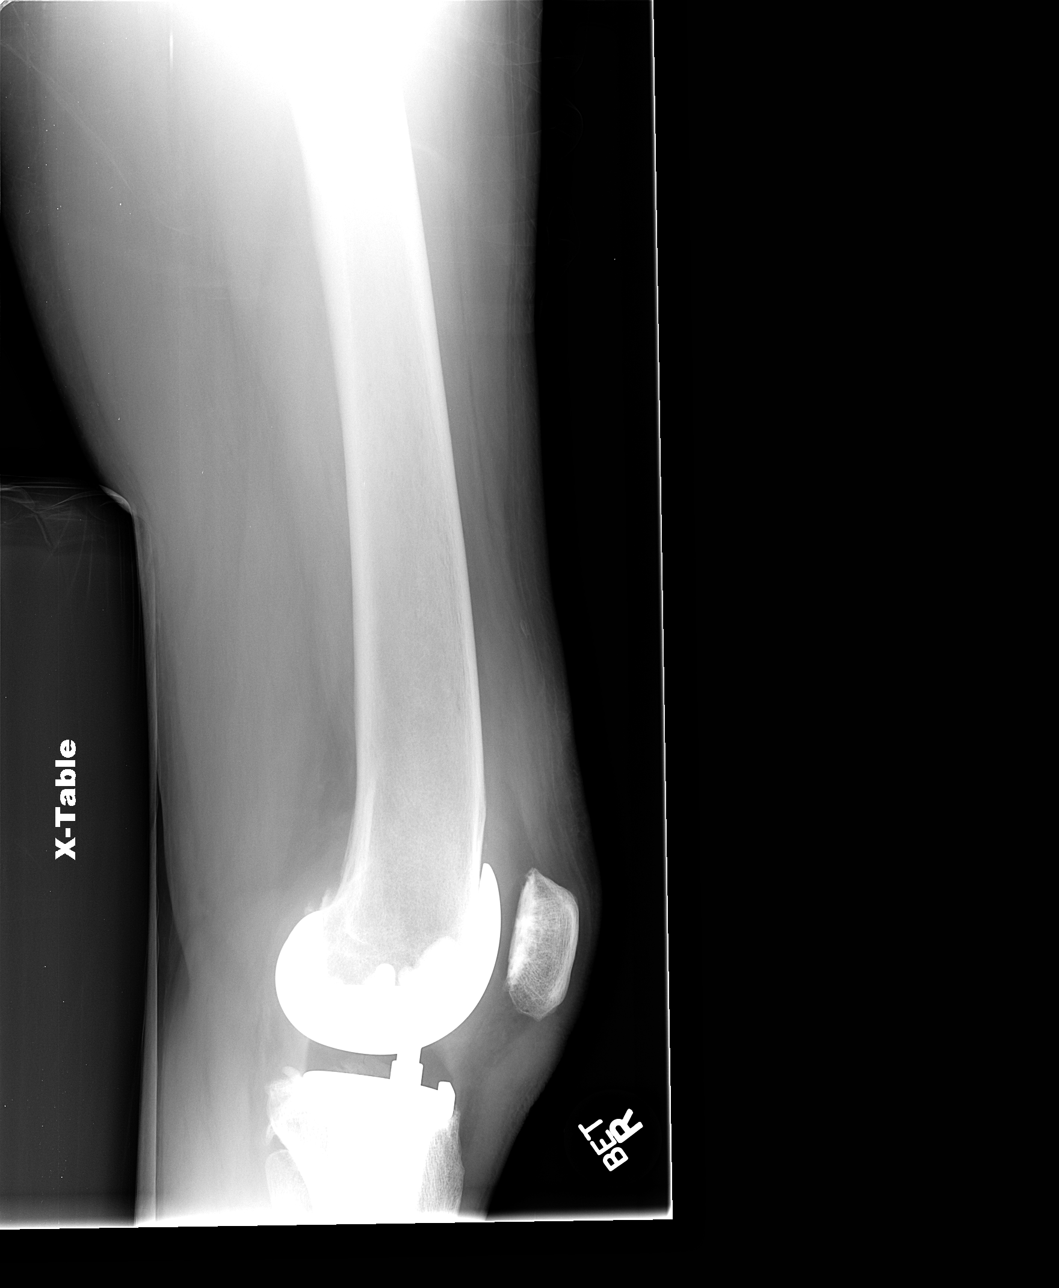

[4 of 4 positions shown; findings below may reference images not displayed]

FINDINGS: No acute fracture deformity. Status post RIGHT hip total
arthroplasty with intact well seated hardware. No periprosthetic
lucency. No destructive bony lesions. Soft tissue planes are
nonsuspicious.

Status post RIGHT knee total arthroplasty, partially characterized.
IMPRESSION: No acute fracture deformity or dislocation.

Status post RIGHT hip total arthroplasty without radiographic
findings of hardware failure.

  By: Niciimasharaf Meeow

## 2016-05-24 ENCOUNTER — Emergency Department (HOSPITAL_COMMUNITY): Payer: Medicare Other

## 2016-05-24 ENCOUNTER — Encounter (HOSPITAL_COMMUNITY): Payer: Self-pay | Admitting: *Deleted

## 2016-05-24 ENCOUNTER — Emergency Department (HOSPITAL_COMMUNITY)
Admission: EM | Admit: 2016-05-24 | Discharge: 2016-05-25 | Disposition: A | Discharge status: Home / Self Care | Payer: Medicare Other | Attending: Emergency Medicine | Admitting: Emergency Medicine

## 2016-05-24 DIAGNOSIS — Y999 Unspecified external cause status: Secondary | ICD-10-CM | POA: Diagnosis not present

## 2016-05-24 DIAGNOSIS — Z7984 Long term (current) use of oral hypoglycemic drugs: Secondary | ICD-10-CM | POA: Diagnosis not present

## 2016-05-24 DIAGNOSIS — F17210 Nicotine dependence, cigarettes, uncomplicated: Secondary | ICD-10-CM | POA: Insufficient documentation

## 2016-05-24 DIAGNOSIS — S6992XA Unspecified injury of left wrist, hand and finger(s), initial encounter: Secondary | ICD-10-CM | POA: Diagnosis present

## 2016-05-24 DIAGNOSIS — Y9389 Activity, other specified: Secondary | ICD-10-CM | POA: Insufficient documentation

## 2016-05-24 DIAGNOSIS — S60222A Contusion of left hand, initial encounter: Secondary | ICD-10-CM | POA: Insufficient documentation

## 2016-05-24 DIAGNOSIS — W228XXA Striking against or struck by other objects, initial encounter: Secondary | ICD-10-CM | POA: Insufficient documentation

## 2016-05-24 DIAGNOSIS — Y92009 Unspecified place in unspecified non-institutional (private) residence as the place of occurrence of the external cause: Secondary | ICD-10-CM | POA: Insufficient documentation

## 2016-05-24 DIAGNOSIS — E119 Type 2 diabetes mellitus without complications: Secondary | ICD-10-CM | POA: Diagnosis not present

## 2016-05-24 DIAGNOSIS — Z79899 Other long term (current) drug therapy: Secondary | ICD-10-CM | POA: Insufficient documentation

## 2016-05-24 MED ORDER — TRAMADOL HCL 50 MG PO TABS
50.0000 mg | ORAL_TABLET | Freq: Once | ORAL | Status: AC
  Administered 2016-05-24: 50 mg via ORAL
  Filled 2016-05-24: qty 1

## 2016-05-24 MED ORDER — KETOROLAC TROMETHAMINE 30 MG/ML IJ SOLN
30.0000 mg | Freq: Once | INTRAMUSCULAR | Status: AC
  Administered 2016-05-24: 30 mg via INTRAVENOUS
  Filled 2016-05-24: qty 1

## 2016-05-24 MED ORDER — KETOROLAC TROMETHAMINE 60 MG/2ML IM SOLN: 60.0000 mg | Freq: Once | INTRAMUSCULAR | Status: DC

## 2016-05-24 NOTE — ED Notes (Signed)
Pt has positive radial pulse and able to move fingers without difficulty

## 2016-05-24 NOTE — ED Triage Notes (Signed)
Pt brought in by [facility] for c/o hand injury; pt admits to drinking 2 12oz beers and then working with a truck transmission and hit his left hand with a 3 pound sledgehammer; pt has swelling and bruising to left hand; pt was given 4mg  zofran and 4mg  morphine IV en route by ems

## 2016-05-25 MED ORDER — HYDROCODONE-ACETAMINOPHEN 5-325 MG PO TABS
1.0000 | ORAL_TABLET | Freq: Once | ORAL | Status: AC
  Administered 2016-05-25: 1 via ORAL
  Filled 2016-05-25: qty 1

## 2016-05-25 MED ORDER — DICLOFENAC SODIUM 75 MG PO TBEC: 75.0000 mg | DELAYED_RELEASE_TABLET | Freq: Two times a day (BID) | ORAL | 0 refills | Status: DC

## 2016-05-25 MED ORDER — TRAMADOL HCL 50 MG PO TABS: 50.0000 mg | ORAL_TABLET | Freq: Four times a day (QID) | ORAL | 0 refills | Status: DC | PRN

## 2016-05-25 NOTE — Discharge Instructions (Signed)
Your xrays are negative for any fracture or dislocation from todays injury.  Ice and elevation, compression which the special ace wrap will provide will help with pain and swelling.  You may take the tramadol prescribed for pain relief.  This will make you drowsy - do not drive within 4 hours of taking this medication.  Diclofenac is also a strong anti - inflammatory pain reliever and will help also with swelling.

## 2016-05-26 NOTE — ED Provider Notes (Signed)
[facility] DEPT Provider Note   CSN: [id number] Arrival date & time: 05/24/16  2127     History   Chief Complaint Chief Complaint  Patient presents with  . Hand Pain    HPI [name] is a 51 y.o. 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  He had been drinking at the time of the event, states drank 12 oz cans of beer prior to the event.  His pain is severe, worsened with palpation and movement.  H was given morphine 4 mg IV per ems prior to arrival but denies any relief of pain.  The history is provided by the patient.    Past Medical History:  Diagnosis Date  . Anxiety   . Arthritis    "everywhere" (04/21/2012)  . Cellulitis and abscess of foot 12/19/2013   LEFT FOOT  . Chronic pain   . DDD (degenerative disc disease)   . Depression   . Diabetes mellitus without complication (HCC)    borderline  . Diabetic foot ulcer ([place])   . ETOH abuse   . GERD (gastroesophageal reflux disease)    tums  . History of blood transfusion    "related to left knee OR; probably right hip too" (04/21/2012)  . Mental disorder   . Neuromuscular disorder (HCC)    neuropathy  . Noncompliance   . Open wound    bottom of foot  . Pneumonia ~ 2012  . Polysubstance abuse    etoh, cocaine, heroin  . Stroke [facility]) 2008   "they said I might have had one during right hip replacement" (04/21/2012)    Patient Active Problem List   Diagnosis Date Noted  . Tobacco use disorder 10/25/2014  . Diabetes mellitus type 2 in nonobese (HCC)   . Swelling   . Type 2 diabetes mellitus with left diabetic foot ulcer ([place]) 12/18/2013  . Diabetes mellitus due to underlying condition with foot ulcer ([place]) 12/18/2013  . Left leg cellulitis 12/18/2013  . Cellulitis of right lower extremity 07/19/2013  . Cocaine abuse 03/29/2012  . Generalized anxiety  disorder 03/29/2012  . Panic attacks 03/29/2012  . Alcohol dependence ([place]) 08/05/2011    Class: Chronic  . Substance abuse 05/31/2011  . Foot ulcer, left ([place]) 02/15/2011  . Fasting hyperglycemia 02/15/2011  . Neuropathic ulcer of foot ([place]) 02/15/2011    Past Surgical History:  Procedure Laterality Date  . JOINT REPLACEMENT    . KNEE ARTHROSCOPY Bilateral 1980's/1990's  . LUNG LOBECTOMY Left ~ 2006  . LUNG LOBECTOMY    . METATARSAL OSTEOTOMY  10/29/2011   Procedure: METATARSAL OSTEOTOMY;  Surgeon: [name], MD;  Location: [facility];  Service: Orthopedics;  Laterality: Left;  Left 1st Metatarsal Dorsal Closing Wedge   . REVISION TOTAL HIP ARTHROPLASTY Right 2008   "4-5 months after replacement" (04/21/2012)  . TOTAL HIP ARTHROPLASTY Right 2008  . TOTAL KNEE ARTHROPLASTY Left 2006  . TOTAL KNEE ARTHROPLASTY Right 04/20/2012  . TOTAL KNEE ARTHROPLASTY Right 04/20/2012   Procedure: TOTAL KNEE ARTHROPLASTY;  Surgeon: [name], MD;  Location: [facility] [facility];  Service: Orthopedics;  Laterality: Right;  Right Total Knee Arthroplasty       Home Medications    Prior to Admission medications   Medication Sig Start Date End Date Taking? Authorizing Provider  clonazePAM (KLONOPIN) 1 MG tablet Take 1 mg  by mouth 4 (four) times daily.   Yes [provider]  escitalopram (LEXAPRO) 20 MG tablet Take 40 mg by mouth daily.   Yes [provider]  gabapentin (NEURONTIN) 400 MG capsule Take 1 capsule (400 mg total) by mouth 4 (four) times daily. For substance withdrawal syndrome 03/29/13  Yes Lindell Spar I, NP  metFORMIN (GLUCOPHAGE) 500 MG tablet Take 1 tablet (500 mg total) by mouth 2 (two) times daily with a meal. 01/15/14  Yes Philemon Kingdom, MD  mirtazapine (REMERON) 30 MG tablet Take 30 mg by mouth at bedtime.   Yes [provider]  QUEtiapine (SEROQUEL) 200 MG tablet Take 1 tablet (200 mg total) by mouth at bedtime. For mood control 03/29/13  Yes Lindell Spar I, NP    diclofenac (VOLTAREN) 75 MG EC tablet Take 1 tablet (75 mg total) by mouth 2 (two) times daily. 05/25/16   Evalee Jefferson, PA-C  traMADol (ULTRAM) 50 MG tablet Take 1 tablet (50 mg total) by mouth every 6 (six) hours as needed. 05/25/16   Evalee Jefferson, PA-C    Family History Family History  Problem Relation Age of Onset  . Diabetes Mother   . Diabetes Father     Social History Social History  Substance Use Topics  . Smoking status: Current Every Day Smoker    Packs/day: 1.00    Years: 30.00    Types: Cigarettes  . Smokeless tobacco: Never Used     Comment: 04/21/2012 offered smoking cessation materials; pt declines  12/19/2013 DECLINES SMOKING CESSATION MATERIALS  . Alcohol use 0.0 oz/week     Comment: occasionally     Allergies   Benadryl [diphenhydramine hcl] and Trazodone and nefazodone   Review of Systems Review of Systems  Constitutional: Negative for fever.  Musculoskeletal: Positive for arthralgias and joint swelling. Negative for myalgias.  Skin: Positive for color change.  Neurological: Negative for weakness and numbness.     Physical Exam Updated Vital Signs BP (!) 147/96 (BP Location: Right Arm)   Pulse 79   Temp 97.7 F (36.5 C) (Oral)   Resp 18   Ht 6\' 3"  (1.905 m)   Wt 102.1 kg   SpO2 95%   BMI 28.12 kg/m   Physical Exam  Constitutional: He appears well-developed and well-nourished.  HENT:  Head: Atraumatic.  Neck: Normal range of motion.  Cardiovascular:  Pulses equal bilaterally  Musculoskeletal: He exhibits edema and tenderness. He exhibits no deformity.       Left hand: He exhibits decreased range of motion, tenderness and swelling. He exhibits normal capillary refill and no deformity. Normal sensation noted. Normal strength noted.       Hands: Neurological: He is alert. He has normal strength. He displays normal reflexes. No sensory deficit.  Skin: Skin is warm and dry.  Psychiatric: He has a normal mood and affect.     ED Treatments /  Results  Labs (all labs ordered are listed, but only abnormal results are displayed) Labs Reviewed - No data to display  EKG  EKG Interpretation None       Radiology Dg Wrist Complete Left  Result Date: 05/25/2016 CLINICAL DATA:  Generalize left wrist pain and swelling. Patient struck the left wrist with a sledgehammer. EXAM: LEFT WRIST - COMPLETE 3+ VIEW COMPARISON:  Left hand 05/24/2016 FINDINGS: Degenerative changes demonstrated in the radiocarpal, STT, and first carpometacarpal joints. Joint space narrowing, sclerosis, and osteophyte formation is noted with near bone on bone at the radiocarpal joint. Less prominent  degenerative changes in the intercarpal joints. Dorsal soft tissue swelling is present. No evidence of acute fracture or dislocation. Focal lucency with mild expansile change and well-defined borders in the base of the left first metacarpal bone suggesting a bone cyst or possibly enchondroma. IMPRESSION: Degenerative changes in the left wrist. Soft tissue swelling. No acute fractures identified. Lucent mildly expansile lesion in the first metacarpal bone likely represents bone cyst or enchondroma. Electronically Signed   By: Lucienne Capers M.D.   On: 05/25/2016 00:13   Dg Hand Complete Left  Result Date: 05/24/2016 CLINICAL DATA:  Hit left hand with 3 pound sledgehammer, with bruising and swelling about the hand. Initial encounter. EXAM: LEFT HAND - COMPLETE 3+ VIEW COMPARISON:  None. FINDINGS: There is no evidence of fracture or dislocation. Degenerative change is noted at the first carpometacarpal joint, with osteophyte formation and sclerosis. The carpal rows are otherwise intact, and demonstrate normal alignment. The soft tissues are unremarkable in appearance. IMPRESSION: 1. No evidence of fracture or dislocation. 2. 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 Electronically Signed   By: Garald Balding M.D.   On: 05/24/2016 22:03    Procedures Procedures (including  critical care time)  Medications Ordered in ED Medications  traMADol (ULTRAM) tablet 50 mg (50 mg Oral Given 05/24/16 2316)  ketorolac (TORADOL) 30 MG/ML injection 30 mg (30 mg Intravenous Given 05/24/16 2316)  HYDROcodone-acetaminophen (NORCO/VICODIN) 5-325 MG per tablet 1 tablet (1 tablet Oral Given 05/25/16 0109)     Initial Impression / Assessment and Plan / ED Course  I have reviewed the triage vital signs and the nursing notes.  Pertinent labs & imaging results that were available during my care of the patient were reviewed by me and considered in my medical decision making (see chart for details).     Pt given toradol per IV, tramadol po with no pain relief.  Hydrocodone po x 1.  Images negative for fracture or dislocation. Pt placed in [facility], referral to ortho for f/u care prn.  No fx, no dislocation, distal sensation intact, vascular intact.  Final Clinical Impressions(s) / ED Diagnoses   Final diagnoses:  Contusion of left hand, initial encounter    New Prescriptions Discharge Medication List as of 05/25/2016  1:07 AM    START taking these medications   Details  diclofenac (VOLTAREN) 75 MG EC tablet Take 1 tablet (75 mg total) by mouth 2 (two) times daily., Starting Mon 05/25/2016, Print    traMADol (ULTRAM) 50 MG tablet Take 1 tablet (50 mg total) by mouth every 6 (six) hours as needed., Starting Mon 05/25/2016, Print         Evalee Jefferson, PA-C 05/26/16 1346    Fredia Sorrow, MD 06/02/16 1511

## 2016-06-18 IMAGING — CR DG FOOT COMPLETE 3+V*L*
3 series · 3 of 3 positions shown · non-contrast
Comparison: 06/30/2013

CLINICAL DATA: Fall 6 feet off ladder, left foot pain

EXAM:
LEFT FOOT - COMPLETE 3+ VIEW

[view not recorded (1 of 3)]
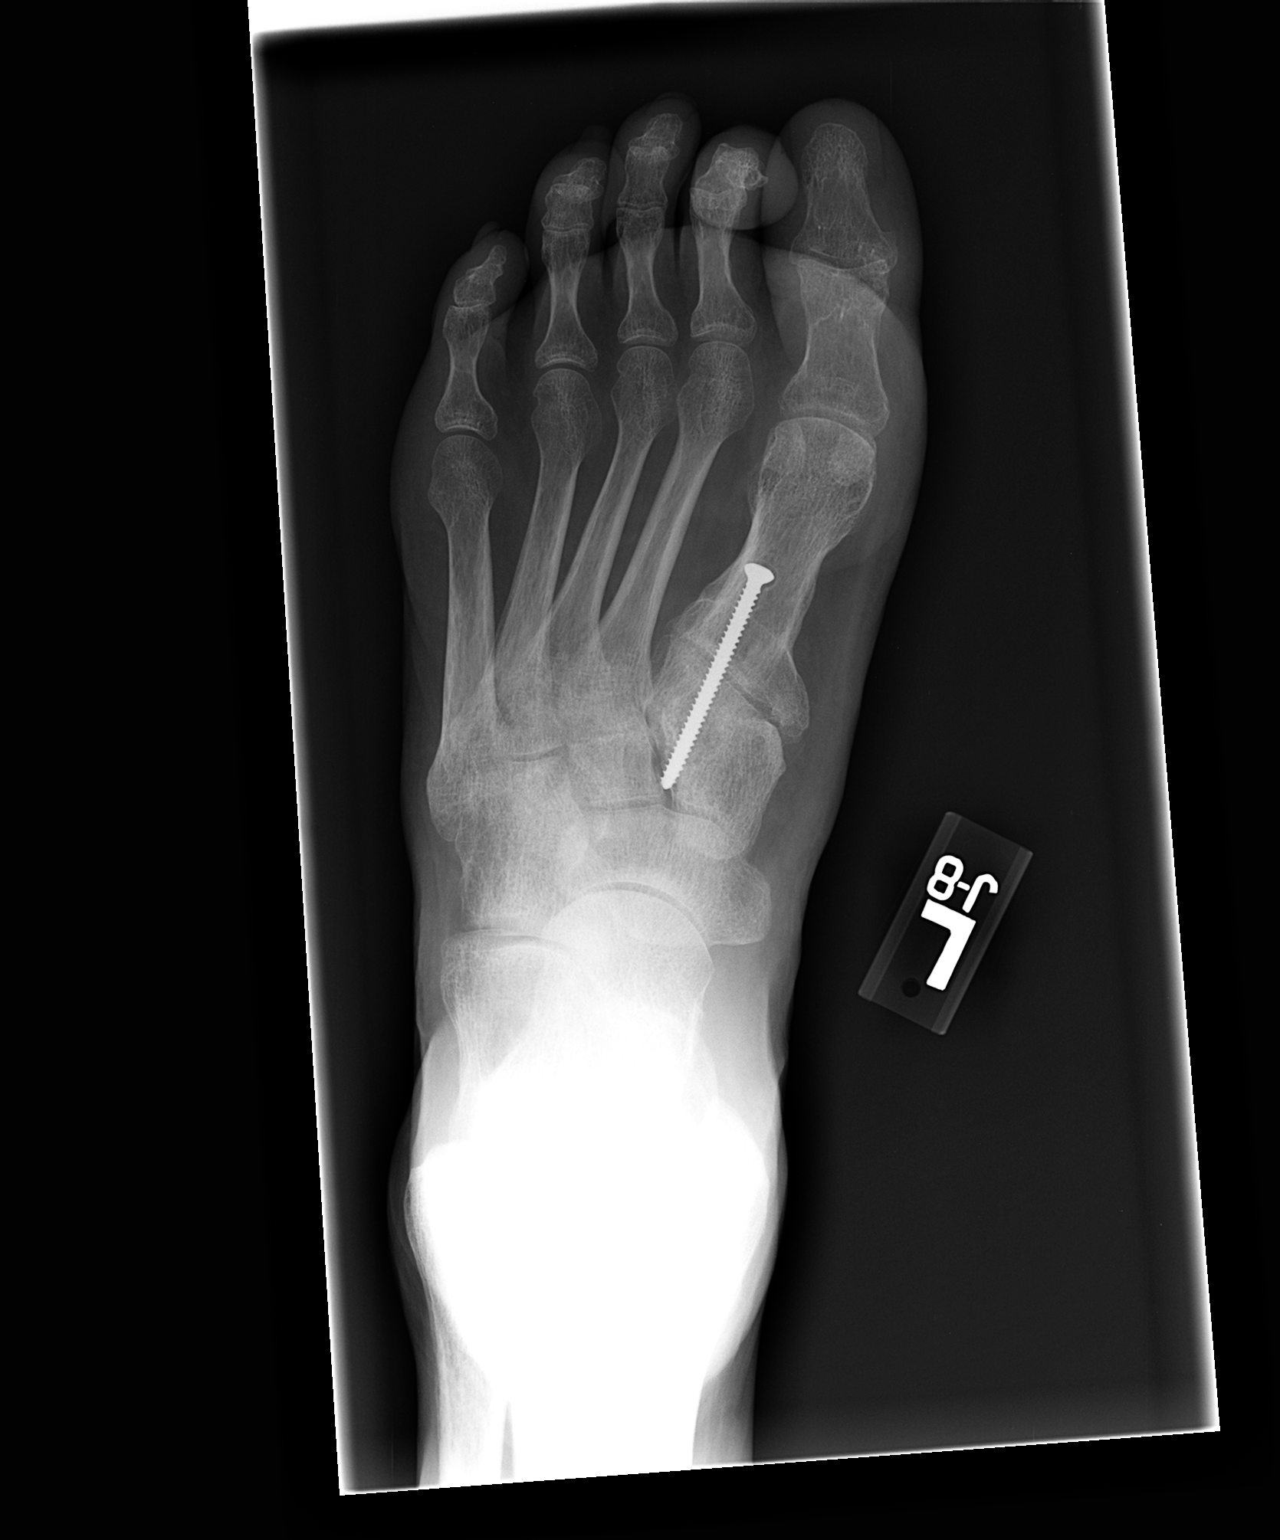

[view not recorded (2 of 3)]
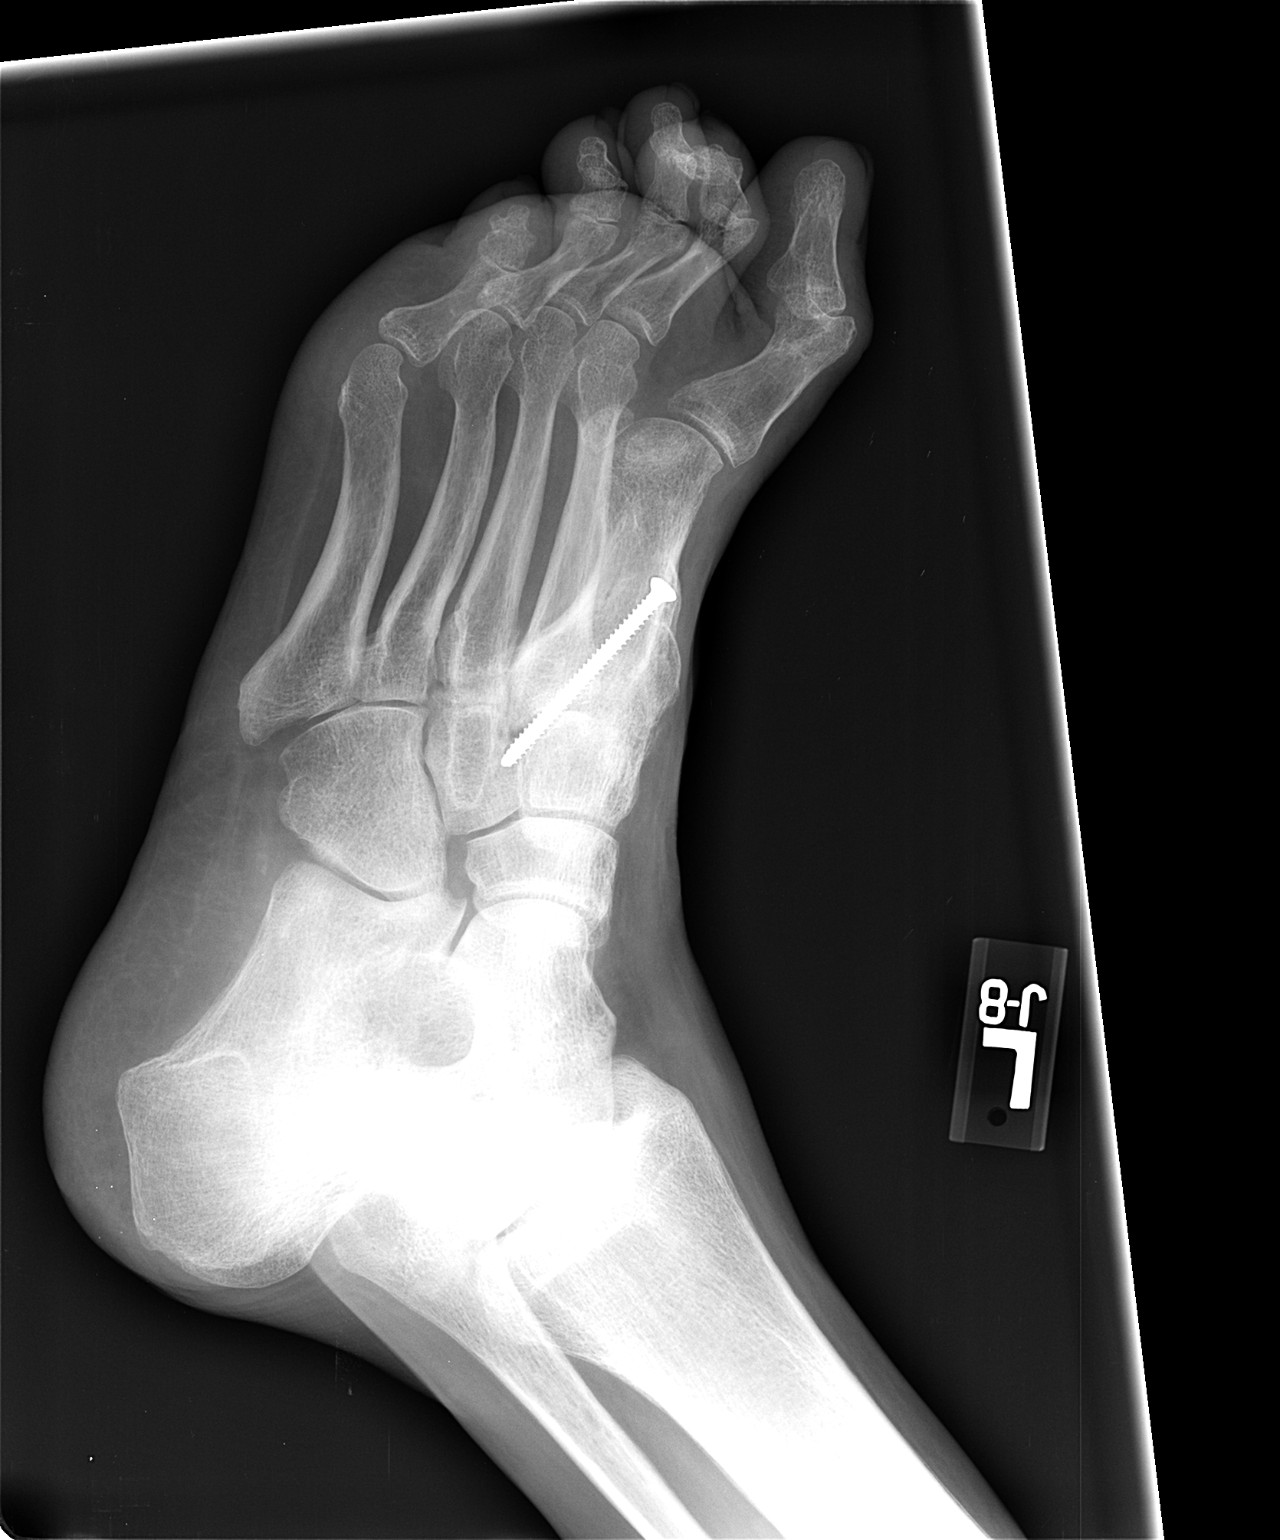

[view not recorded (3 of 3)]
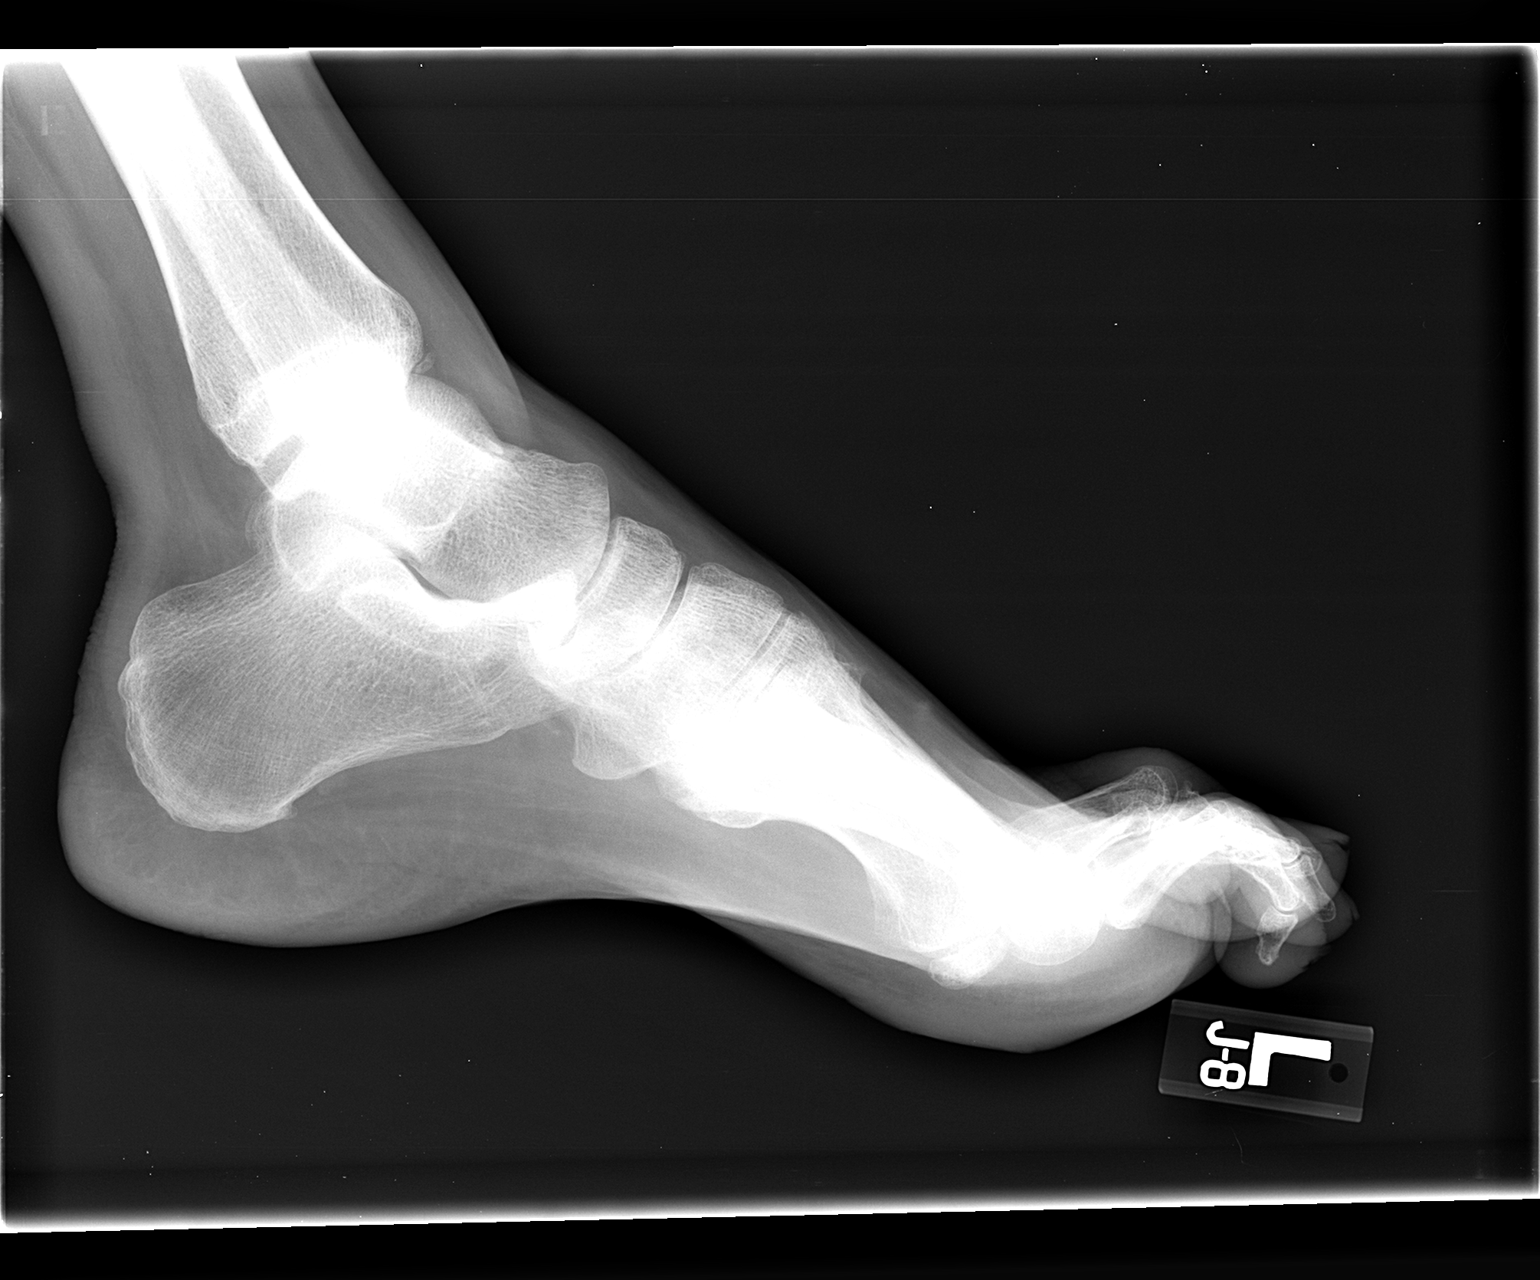

[3 of 3 positions shown; findings below may reference images not displayed]

FINDINGS: Screw fixation of the first metatarsal to the midfoot is noted.
Flexion at the second toe DIP joint reidentified. No fracture or
dislocation. Minimal plantar calcaneal spurring.
IMPRESSION: No acute osseous abnormality of the left foot.

## 2016-06-18 IMAGING — CR DG FEMUR 2+V*R*
4 series · 4 of 4 positions shown · non-contrast
Comparison: 06/30/2013 hip radiographs

CLINICAL DATA: Fall 6 feet from ladder, right leg pain. Right total
hip arthroplasty 6996 and right knee arthroplasty 1479.

EXAM:
RIGHT FEMUR - 2 VIEW

[view not recorded (1 of 4)]
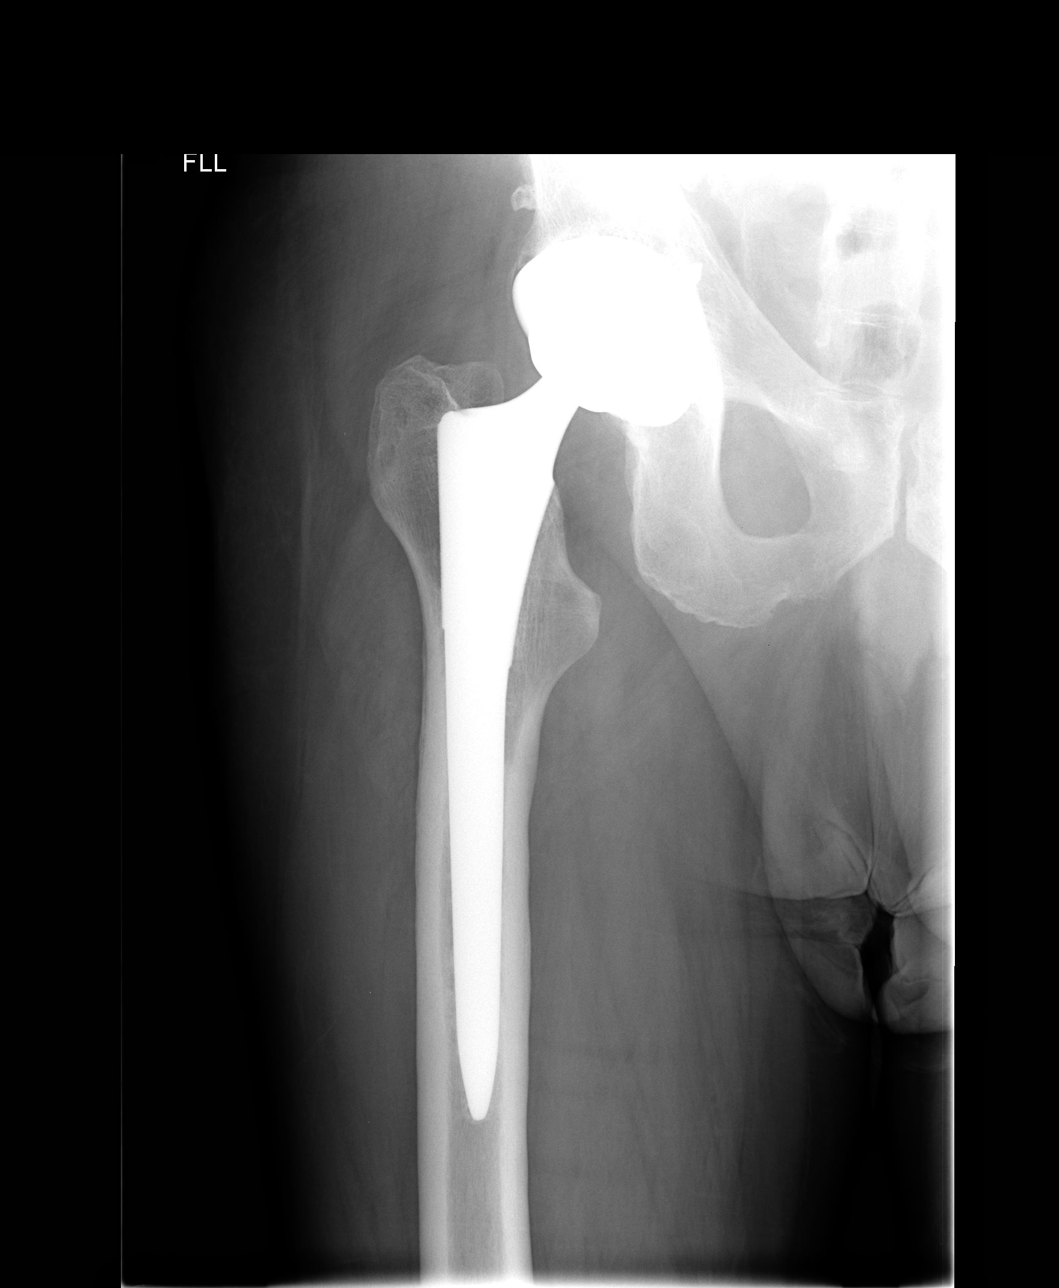

[view not recorded (2 of 4)]
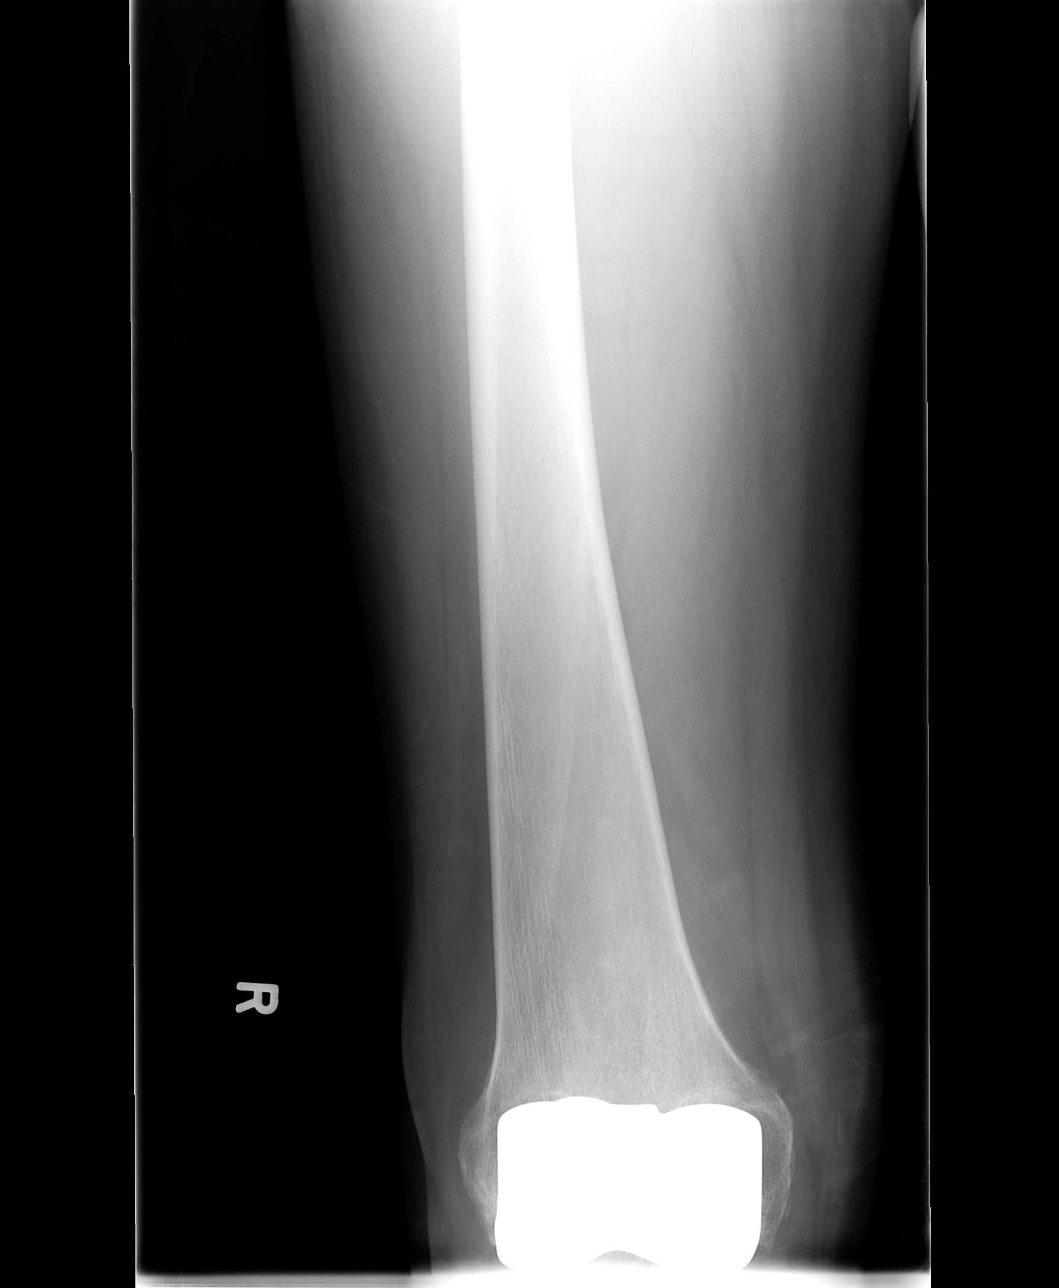

[view not recorded (3 of 4)]
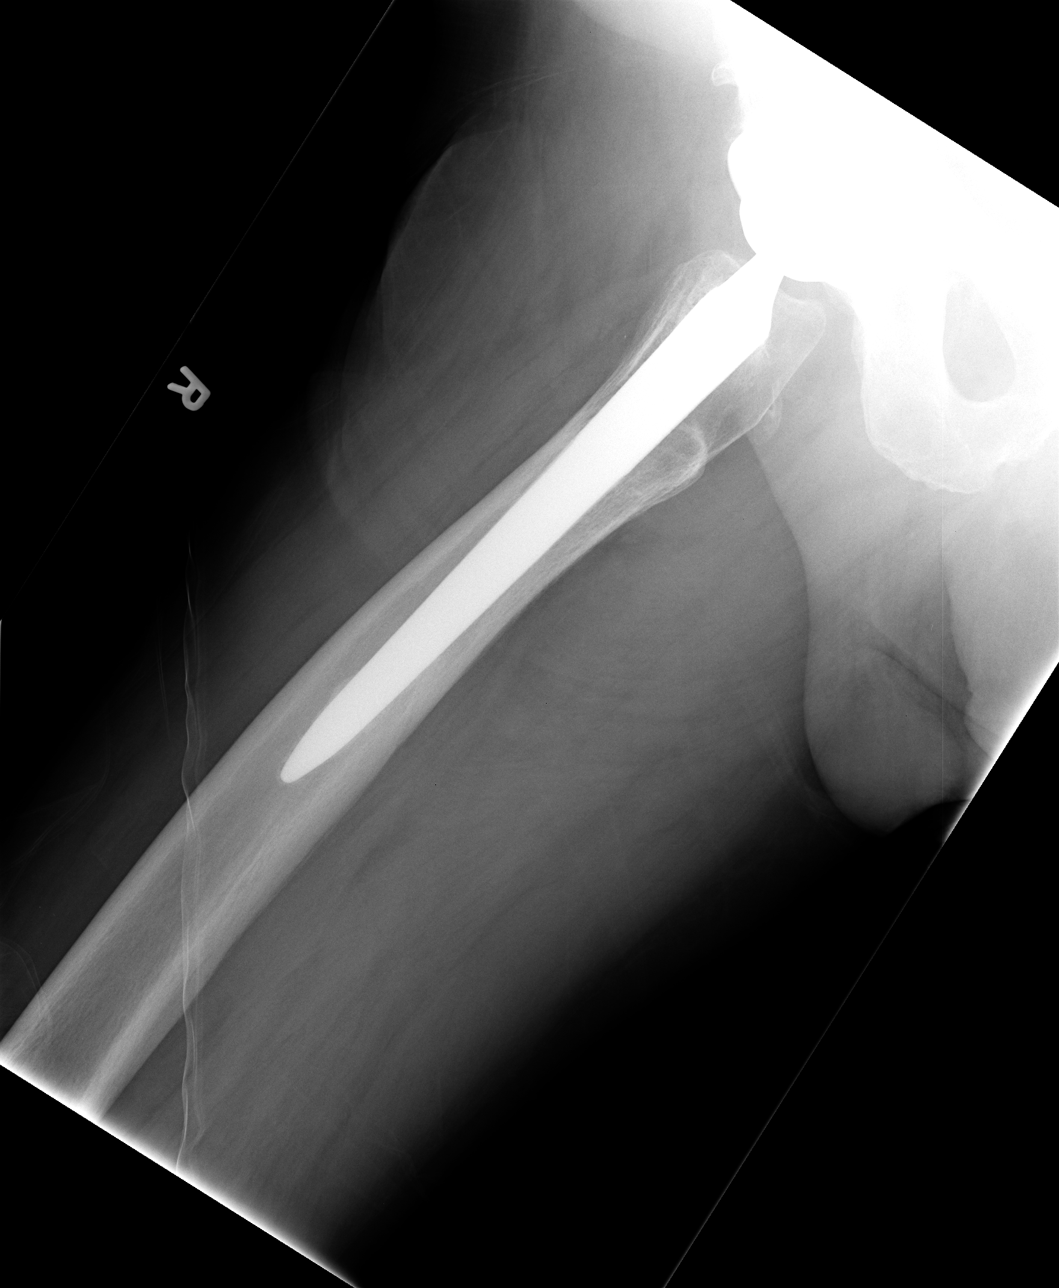

[view not recorded (4 of 4)]
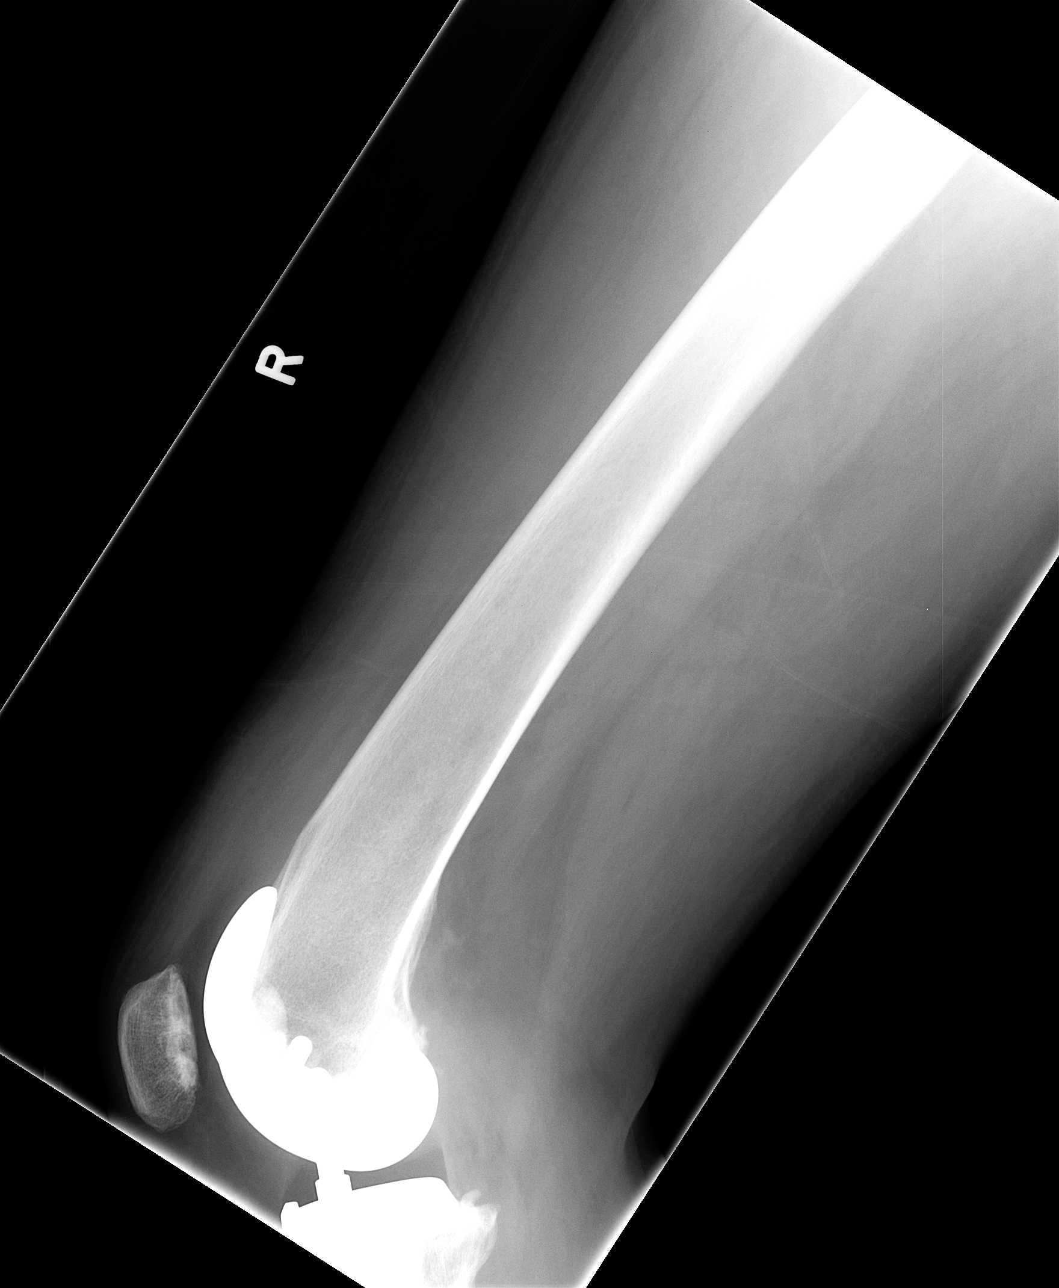

[4 of 4 positions shown; findings below may reference images not displayed]

FINDINGS: There is no evidence of fracture or other focal bone lesions. Soft
tissues are unremarkable. Right total hip and knee arthroplasties
partly visualized. No visualized evidence for hardware failure.
IMPRESSION: No acute osseous abnormality of the right femur.

## 2016-06-18 IMAGING — CR DG KNEE COMPLETE 4+V*R*
4 series · 4 of 4 positions shown · non-contrast
Comparison: 06/30/2013

CLINICAL DATA: Fall, right knee pain

EXAM:
RIGHT KNEE - COMPLETE 4+ VIEW

[view not recorded (1 of 4)]
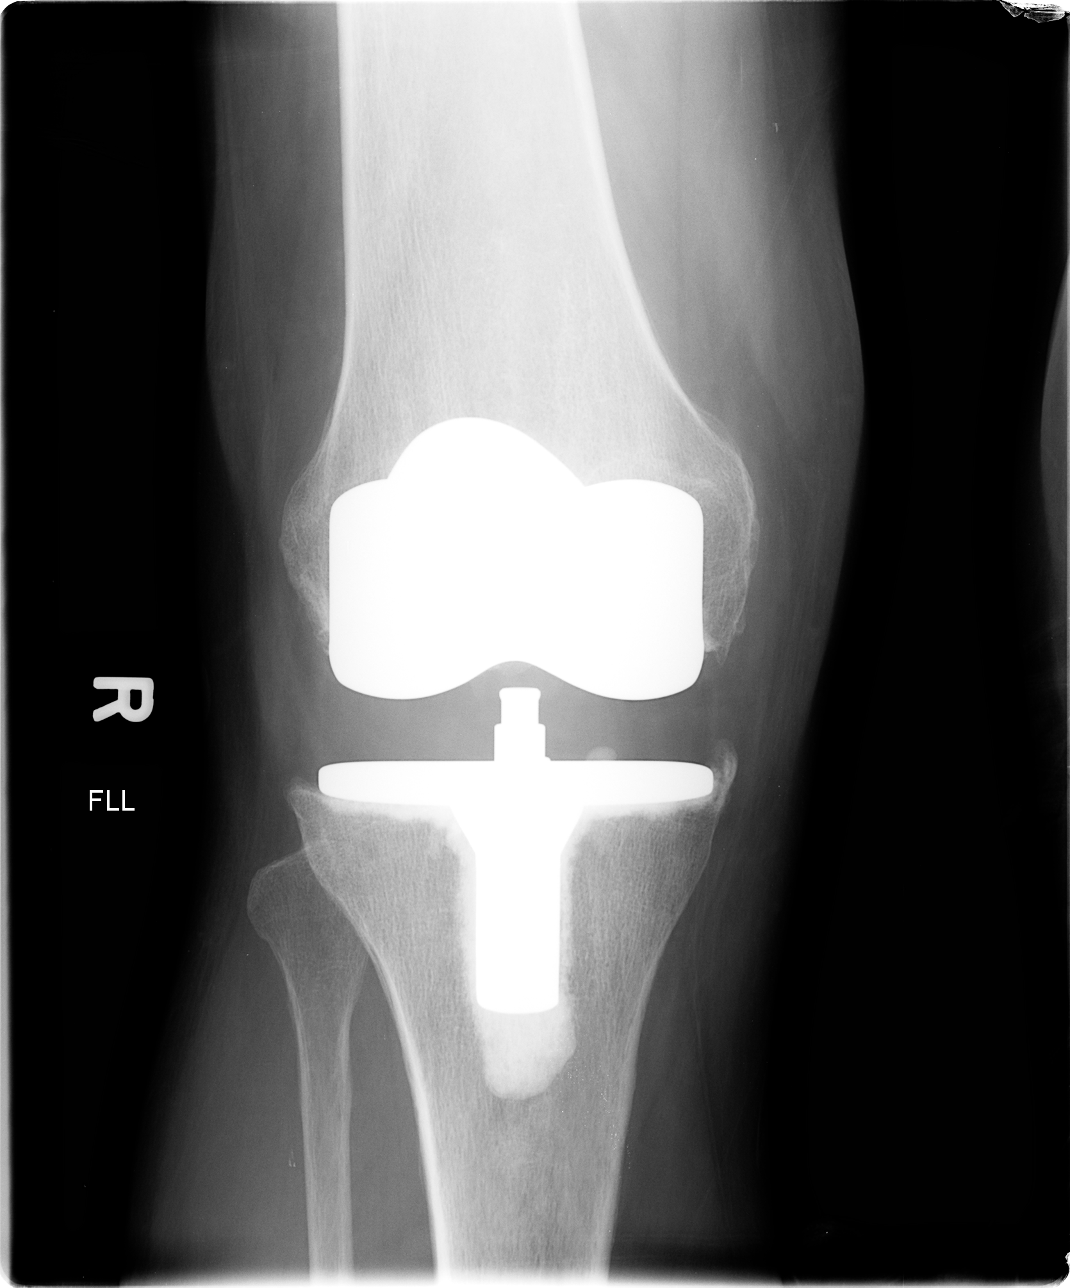

[view not recorded (2 of 4)]
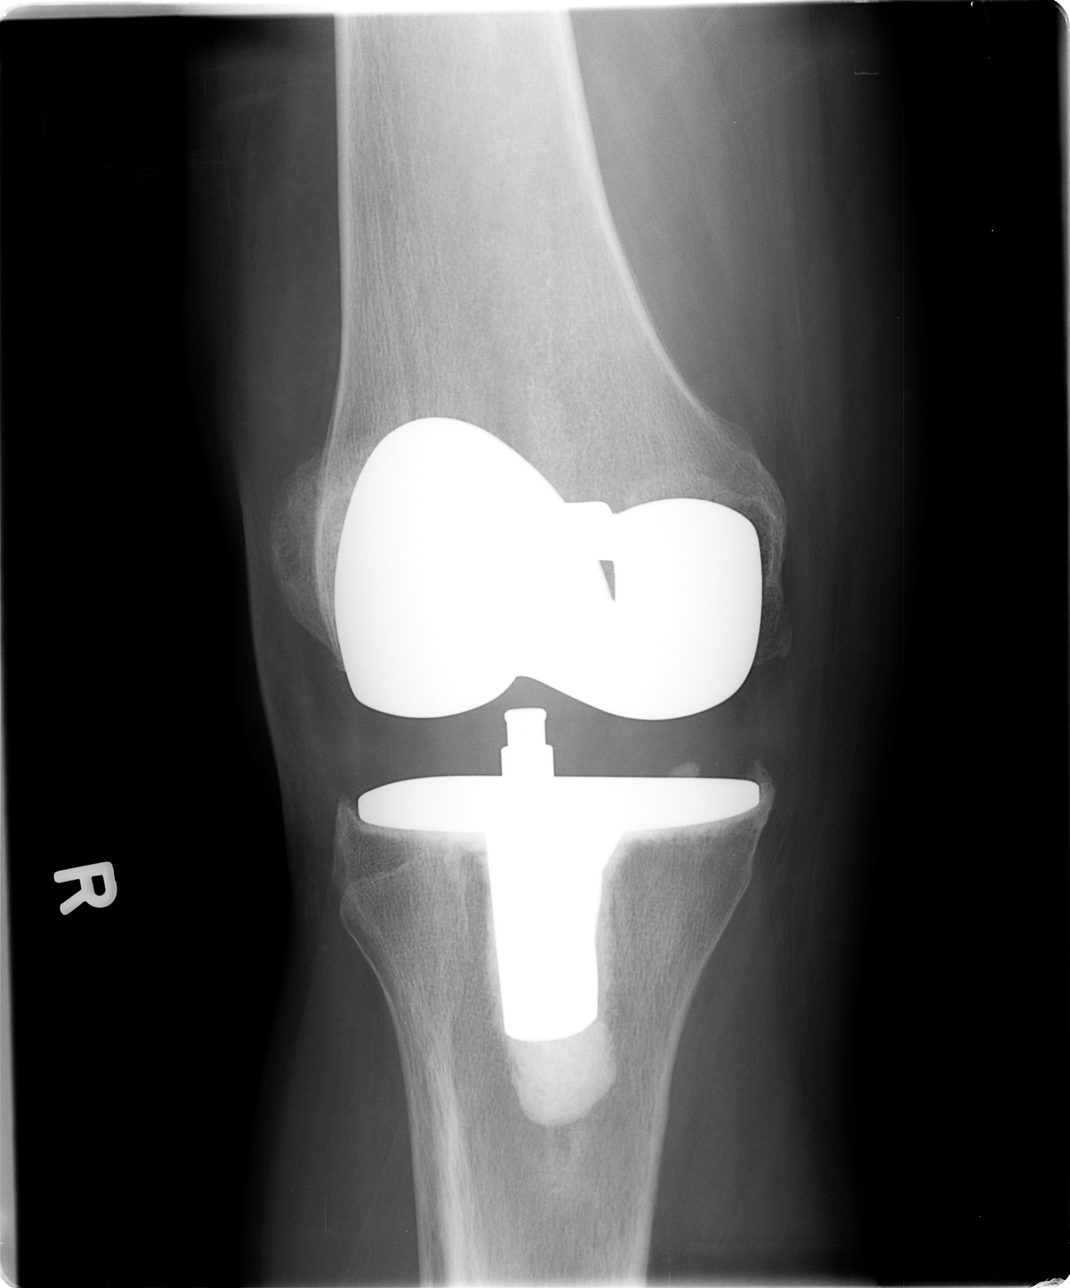

[view not recorded (3 of 4)]
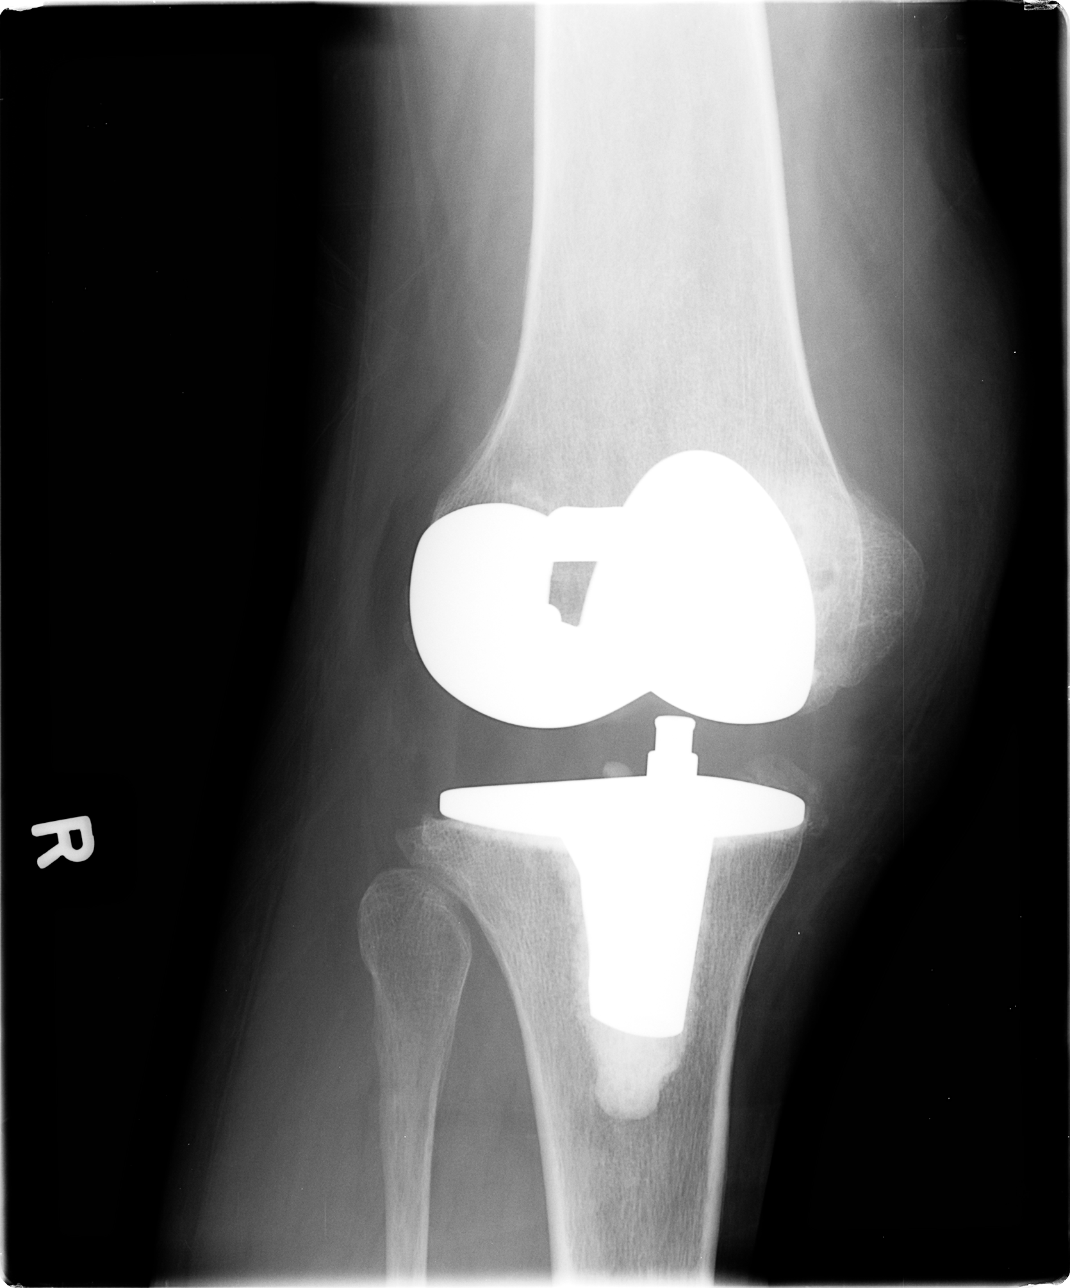

[view not recorded (4 of 4)]
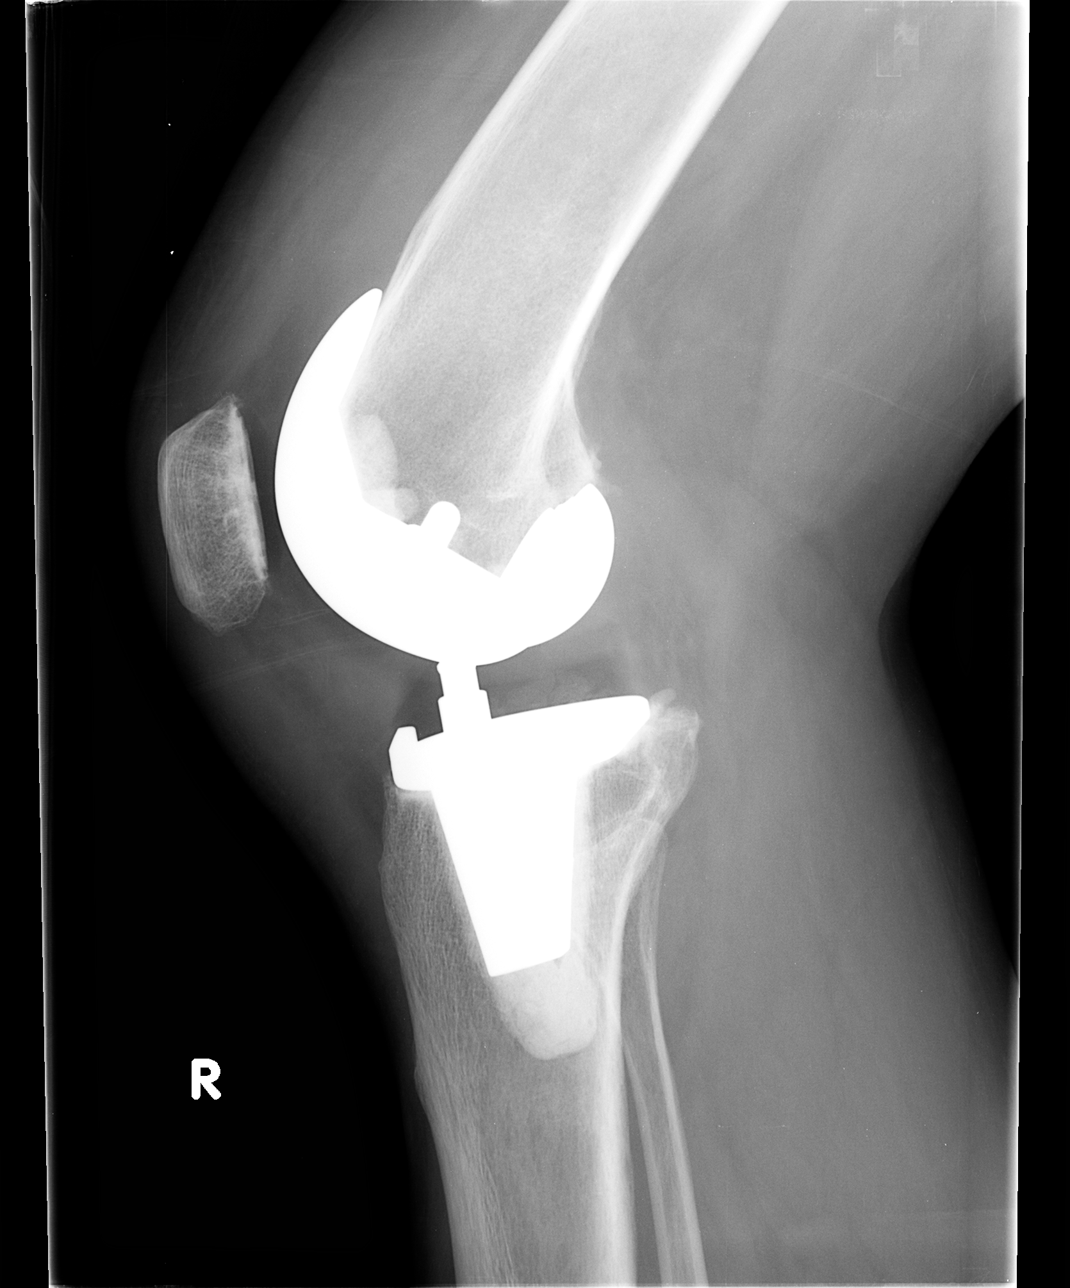

[4 of 4 positions shown; findings below may reference images not displayed]

FINDINGS: There is no evidence of fracture or dislocation. Trace suprapatellar
effusion reidentified. There is no evidence of arthropathy or other
focal bone abnormality. Soft tissues are unremarkable. Evidence of
right total knee arthroplasty without hardware failure evident.
IMPRESSION: Negative.

## 2016-06-18 IMAGING — CR DG HAND COMPLETE 3+V*R*
3 series · 3 of 3 positions shown · non-contrast
Comparison: 03/02/2012

CLINICAL DATA: Right hand pain at the level of the third and fourth
metacarpals, fall 6 feet off ladder

EXAM:
RIGHT HAND - COMPLETE 3+ VIEW

[view not recorded (1 of 3)]
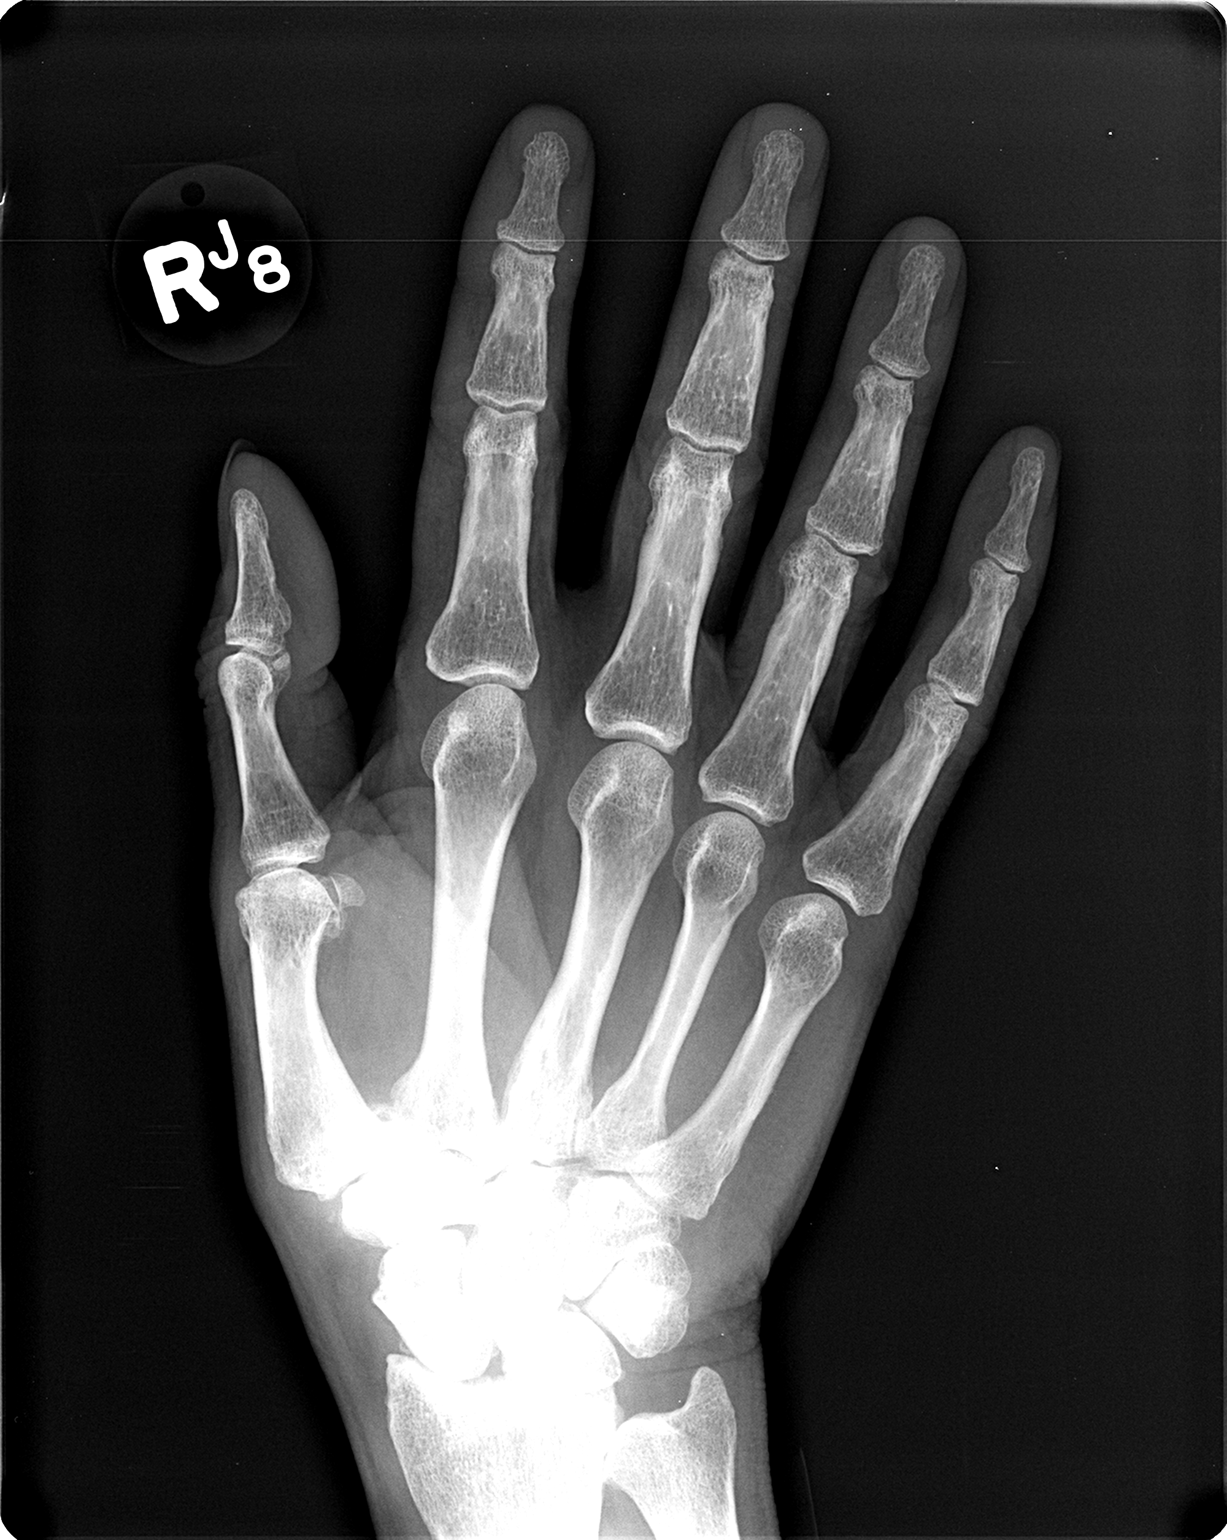

[view not recorded (2 of 3)]
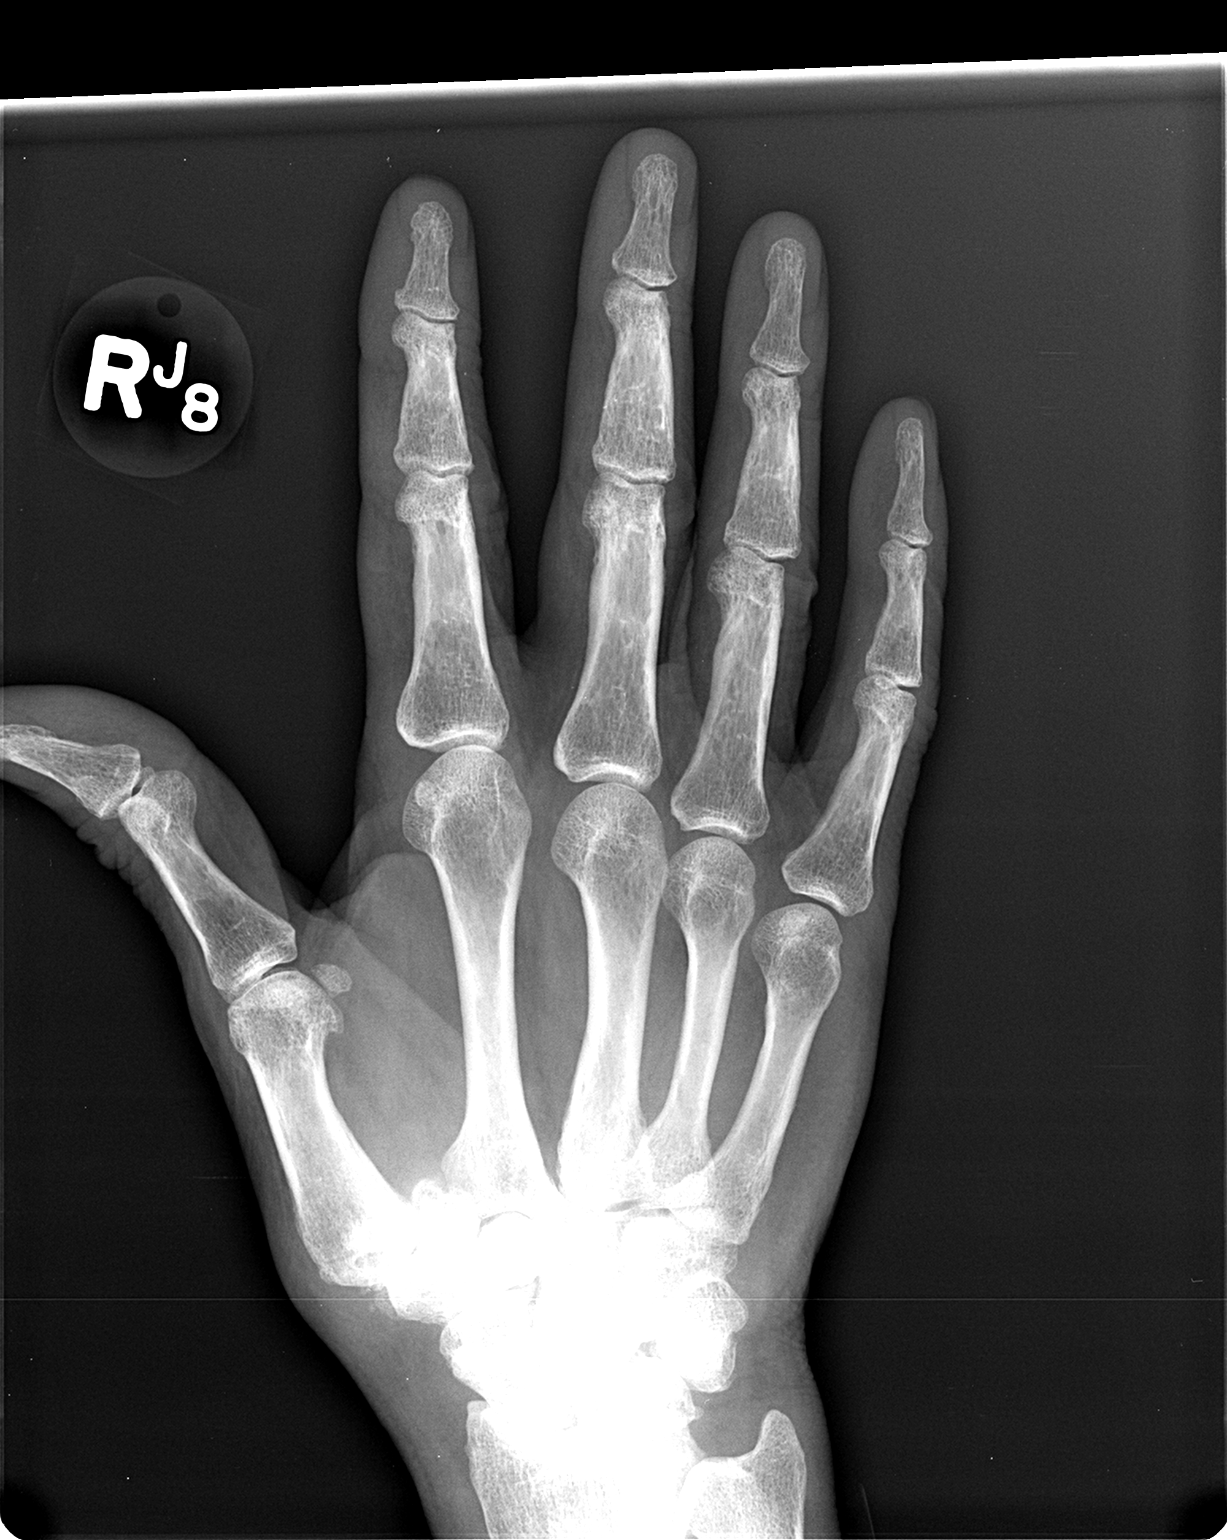

[view not recorded (3 of 3)]
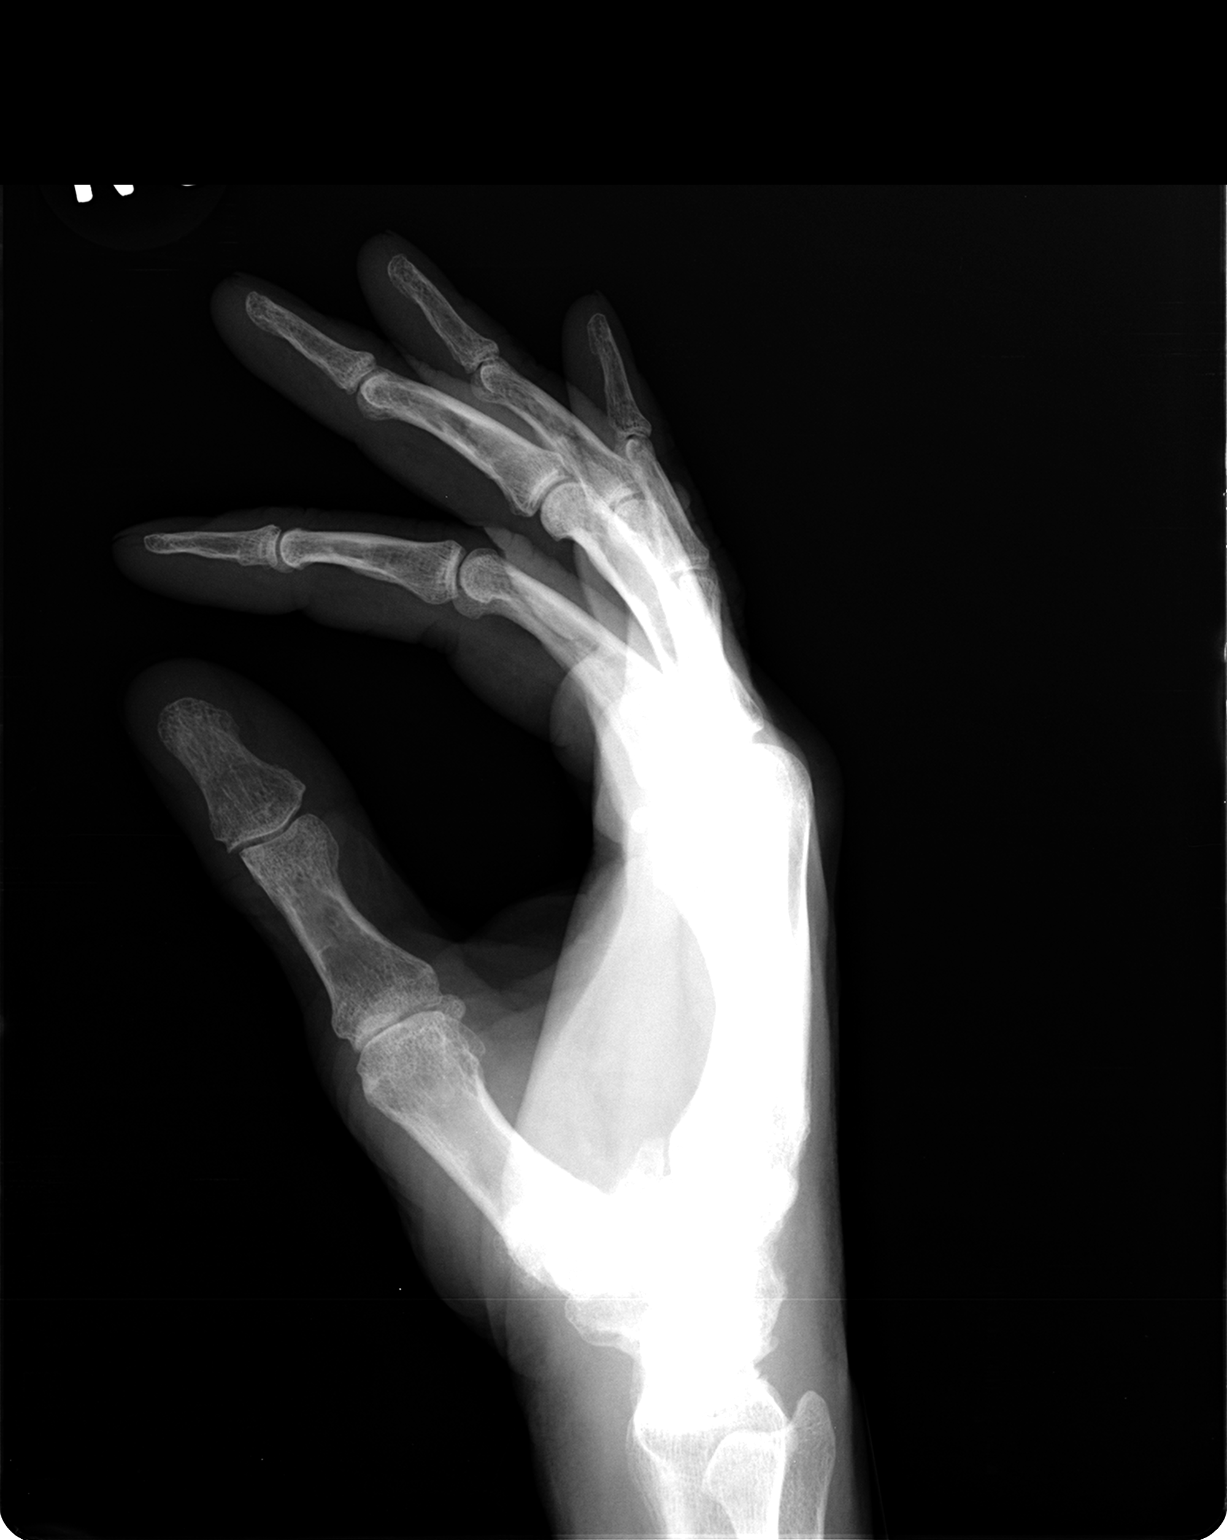

[3 of 3 positions shown; findings below may reference images not displayed]

FINDINGS: No fracture or dislocation. Degenerative changes at the first
carpometacarpal joint. Tip of the thumb is incompletely imaged on
the oblique view. Mild degenerative change at the radiocarpal
articulation. No radiopaque foreign body. Minimal deformity at the
base of the third metacarpal is stable.
IMPRESSION: No acute osseous abnormality.

## 2016-06-25 IMAGING — DX DG ANKLE COMPLETE 3+V*L*
3 series · 3 of 3 positions shown · non-contrast
Comparison: MRI same day

CLINICAL DATA: Left ankle injury

EXAM:
LEFT ANKLE COMPLETE - 3+ VIEW

[ankle ap]
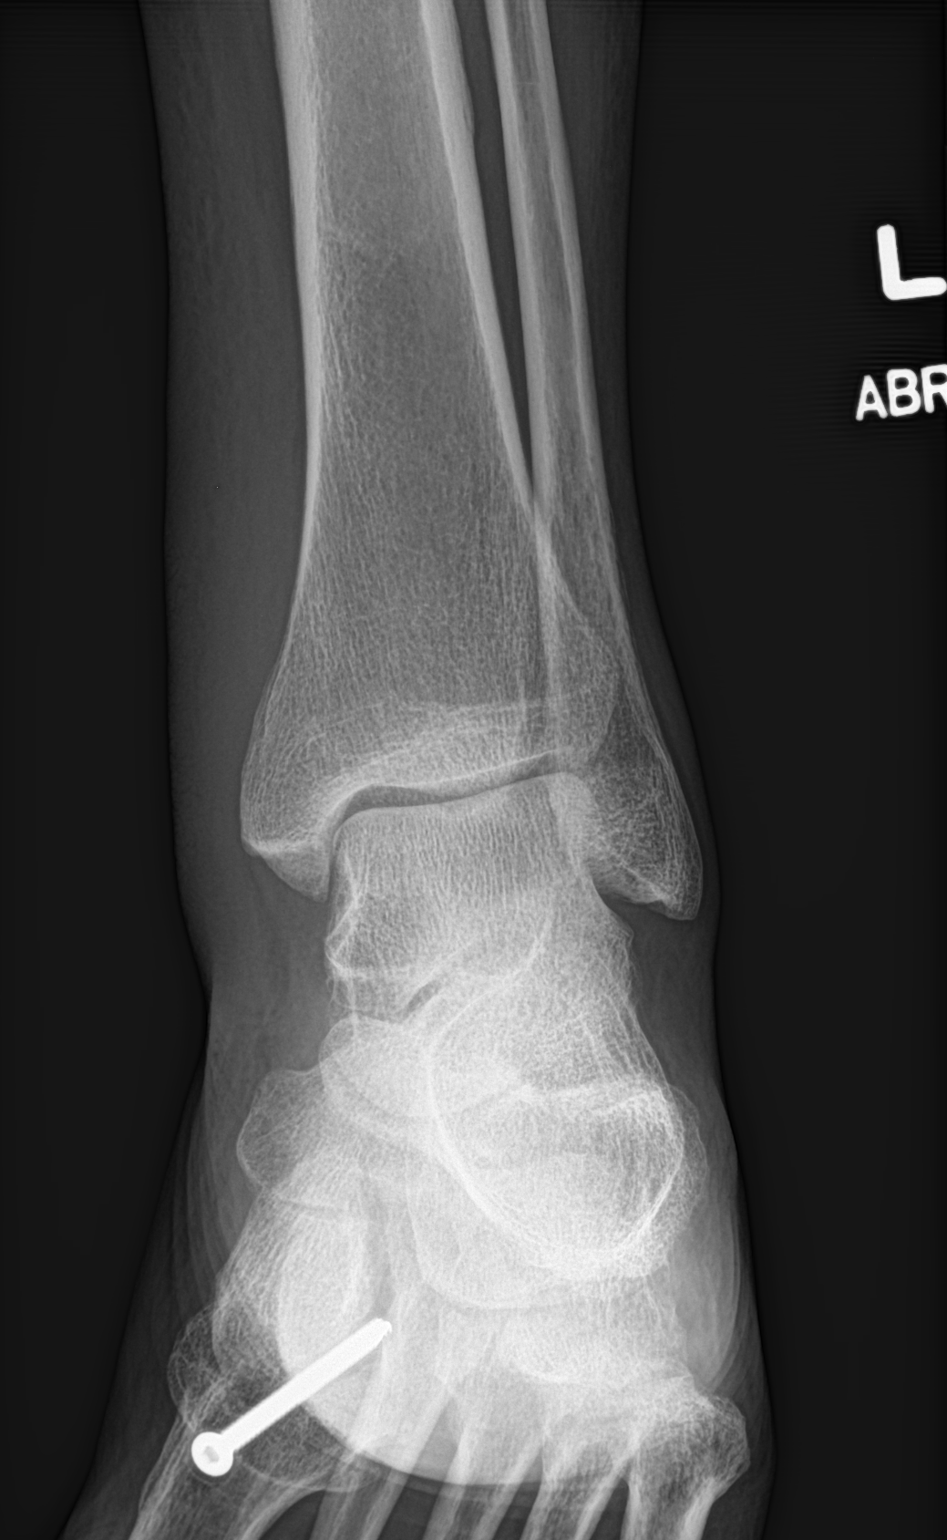

[ankle obl]
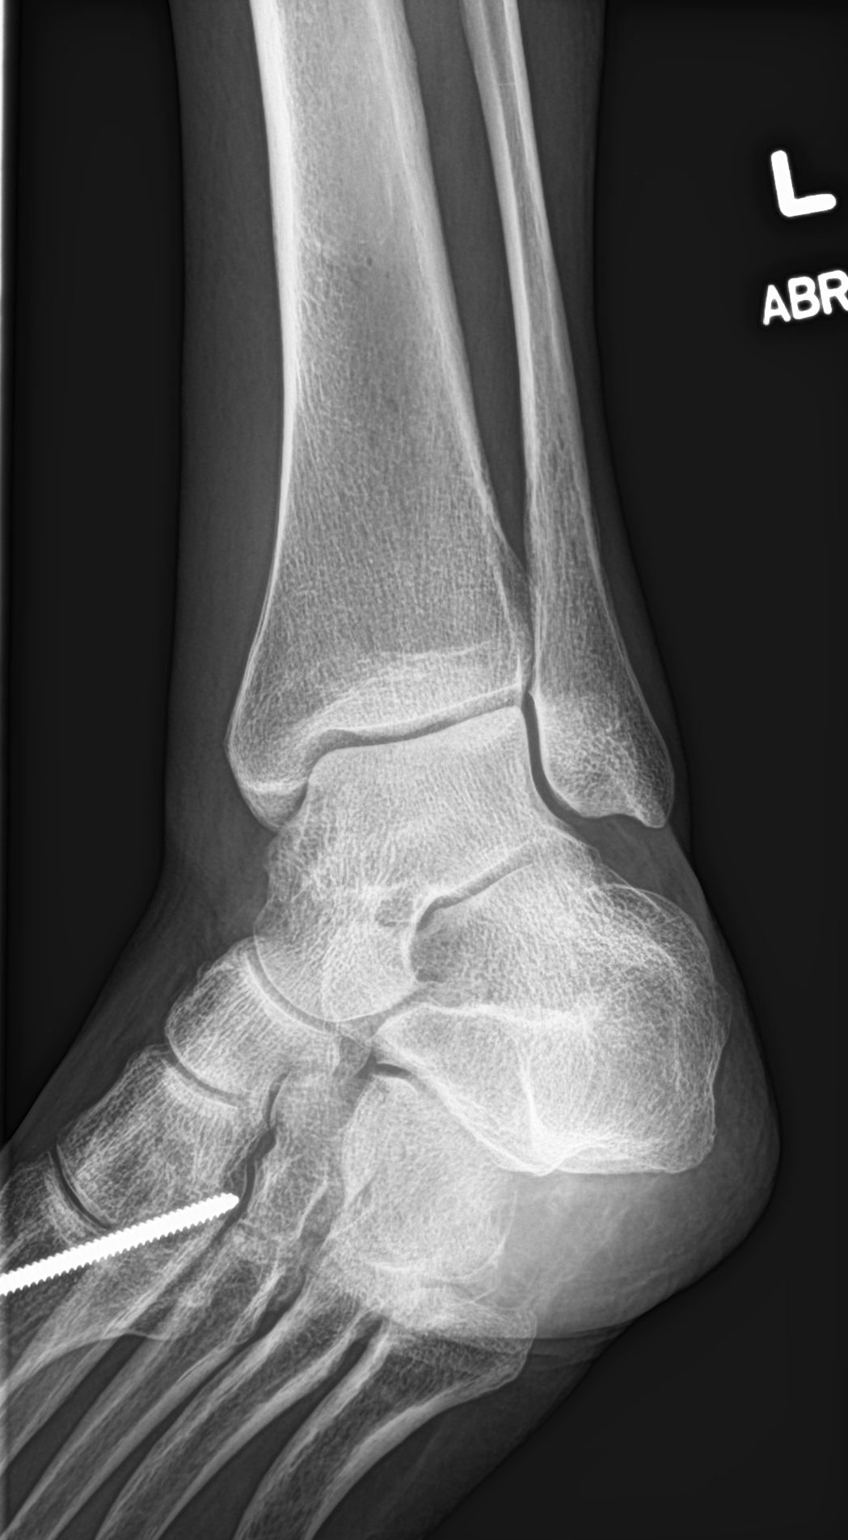

[ankle lat]
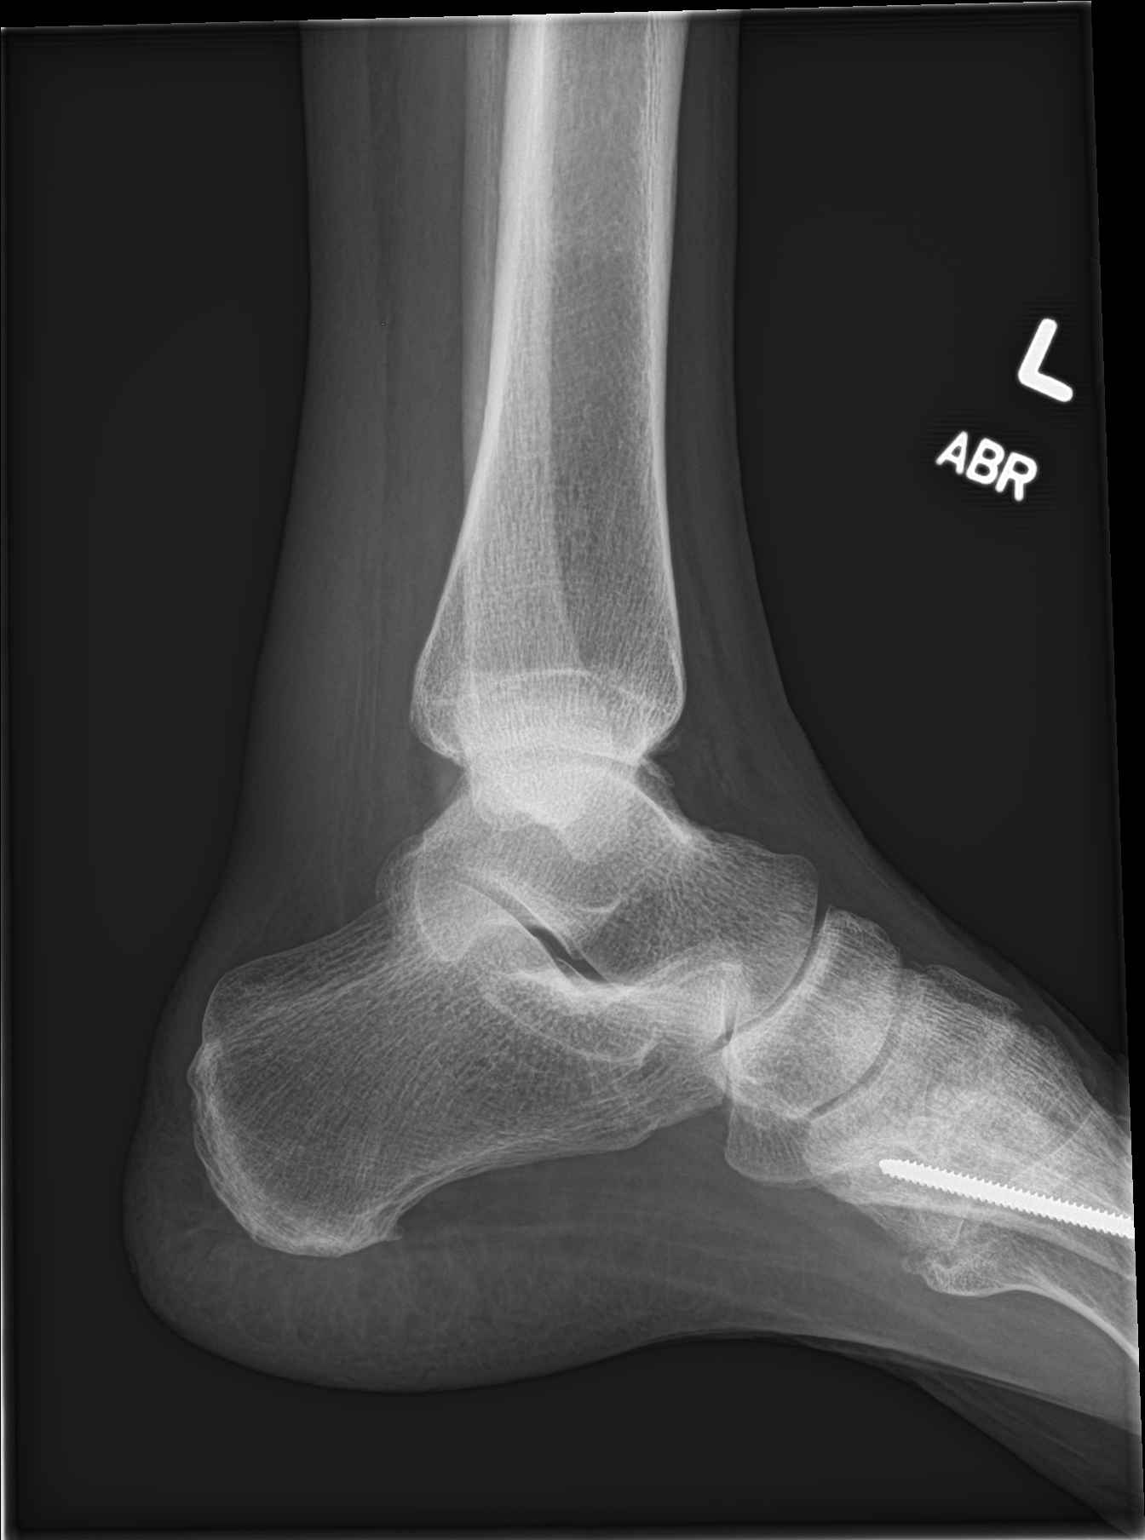

[3 of 3 positions shown; findings below may reference images not displayed]

FINDINGS: Three views of the left ankle submitted. No acute fracture or
subluxation. Soft tissue swelling is noted adjacent to medial
malleolus. Ankle mortise is preserved.
IMPRESSION: No acute fracture or subluxation.  Medial soft tissue swelling.

## 2016-07-08 ENCOUNTER — Emergency Department (HOSPITAL_COMMUNITY)
Admission: EM | Admit: 2016-07-08 | Discharge: 2016-07-09 | Disposition: A | Discharge status: Home / Self Care | Payer: Medicare Other | Attending: Emergency Medicine | Admitting: Emergency Medicine

## 2016-07-08 ENCOUNTER — Encounter (HOSPITAL_COMMUNITY): Payer: Self-pay | Admitting: Emergency Medicine

## 2016-07-08 DIAGNOSIS — M25562 Pain in left knee: Secondary | ICD-10-CM | POA: Insufficient documentation

## 2016-07-08 DIAGNOSIS — M25511 Pain in right shoulder: Secondary | ICD-10-CM | POA: Diagnosis not present

## 2016-07-08 DIAGNOSIS — E119 Type 2 diabetes mellitus without complications: Secondary | ICD-10-CM | POA: Insufficient documentation

## 2016-07-08 DIAGNOSIS — Z79899 Other long term (current) drug therapy: Secondary | ICD-10-CM | POA: Insufficient documentation

## 2016-07-08 DIAGNOSIS — Z96641 Presence of right artificial hip joint: Secondary | ICD-10-CM | POA: Diagnosis not present

## 2016-07-08 DIAGNOSIS — F17210 Nicotine dependence, cigarettes, uncomplicated: Secondary | ICD-10-CM | POA: Diagnosis not present

## 2016-07-08 DIAGNOSIS — M25522 Pain in left elbow: Secondary | ICD-10-CM | POA: Insufficient documentation

## 2016-07-08 DIAGNOSIS — M25512 Pain in left shoulder: Secondary | ICD-10-CM | POA: Diagnosis present

## 2016-07-08 DIAGNOSIS — M25551 Pain in right hip: Secondary | ICD-10-CM | POA: Diagnosis not present

## 2016-07-08 DIAGNOSIS — M25561 Pain in right knee: Secondary | ICD-10-CM | POA: Insufficient documentation

## 2016-07-08 DIAGNOSIS — W19XXXA Unspecified fall, initial encounter: Secondary | ICD-10-CM

## 2016-07-08 DIAGNOSIS — Z96653 Presence of artificial knee joint, bilateral: Secondary | ICD-10-CM | POA: Insufficient documentation

## 2016-07-08 LAB — CBG MONITORING, ED: GLUCOSE-CAPILLARY: 307 mg/dL — AB (ref 65–99)

## 2016-07-08 NOTE — ED Triage Notes (Signed)
Per EMS, patient consumed 2 beers and took his Gabapentin, slid on throw rug, denies LOC or hitting head.  Patient has complaint of bilateral shoulder and knee pain, right hip pain, and left arm and left elbow pain.

## 2016-07-09 ENCOUNTER — Emergency Department (HOSPITAL_COMMUNITY): Payer: Medicare Other

## 2016-07-09 MED ORDER — ACETAMINOPHEN 325 MG PO TABS
650.0000 mg | ORAL_TABLET | Freq: Once | ORAL | Status: AC
  Administered 2016-07-09: 650 mg via ORAL
  Filled 2016-07-09: qty 2

## 2016-07-09 NOTE — ED Provider Notes (Addendum)
[facility] DEPT Provider Note   CSN: [id number] Arrival date & time: 07/08/16  2327     History   Chief Complaint Chief Complaint  Patient presents with  . Fall    HPI Jacobie Stamey is a 51 y.o. male.  HPI  This is a 51 year old male with a history of diabetes, alcohol abuse who presents following a fall. Patient reports that he took his gabapentin tonight and drink a few beers. He states that he slipped and fell on a rug in his hallway. He denies hitting his head or loss of consciousness. He denies syncope or dizziness. Patient reports bilateral shoulder pain, bilateral knee pain, left elbow pain and right hip pain. Currently he rates his pain a 10 out of 10. He has not taken anything for the pain. He denies any chest pain, shortness breath, abdominal pain. He was able to ambulate when EMS helped him stand up.  Past Medical History:  Diagnosis Date  . Anxiety   . Arthritis    "everywhere" (04/21/2012)  . Cellulitis and abscess of foot 12/19/2013   LEFT FOOT  . Chronic pain   . DDD (degenerative disc disease)   . Depression   . Diabetes mellitus without complication (HCC)    borderline  . Diabetic foot ulcer ([place])   . ETOH abuse   . GERD (gastroesophageal reflux disease)    tums  . History of blood transfusion    "related to left knee OR; probably right hip too" (04/21/2012)  . Mental disorder   . Neuromuscular disorder (HCC)    neuropathy  . Noncompliance   . Open wound    bottom of foot  . Pneumonia ~ 2012  . Polysubstance abuse    etoh, cocaine, heroin  . Stroke [facility]) 2008   "they said I might have had one during right hip replacement" (04/21/2012)    Patient Active Problem List   Diagnosis Date Noted  . Tobacco use disorder 10/25/2014  . Diabetes mellitus type 2 in nonobese (HCC)   . Swelling   . Type 2 diabetes mellitus with left diabetic foot ulcer ([place]) 12/18/2013  . Diabetes mellitus due to underlying condition with foot ulcer ([place]) 12/18/2013  . Left  leg cellulitis 12/18/2013  . Cellulitis of right lower extremity 07/19/2013  . Cocaine abuse 03/29/2012  . Generalized anxiety disorder 03/29/2012  . Panic attacks 03/29/2012  . Alcohol dependence ([place]) 08/05/2011    Class: Chronic  . Substance abuse 05/31/2011  . Foot ulcer, left ([place]) 02/15/2011  . Fasting hyperglycemia 02/15/2011  . Neuropathic ulcer of foot ([place]) 02/15/2011    Past Surgical History:  Procedure Laterality Date  . JOINT REPLACEMENT    . KNEE ARTHROSCOPY Bilateral 1980's/1990's  . LUNG LOBECTOMY Left ~ 2006  . LUNG LOBECTOMY    . METATARSAL OSTEOTOMY  10/29/2011   Procedure: METATARSAL OSTEOTOMY;  Surgeon: [name], MD;  Location: [facility];  Service: Orthopedics;  Laterality: Left;  Left 1st Metatarsal Dorsal Closing Wedge   . REVISION TOTAL HIP ARTHROPLASTY Right 2008   "4-5 months after replacement" (04/21/2012)  . TOTAL HIP ARTHROPLASTY Right 2008  . TOTAL KNEE ARTHROPLASTY Left 2006  . TOTAL KNEE ARTHROPLASTY Right 04/20/2012  . TOTAL KNEE ARTHROPLASTY Right 04/20/2012   Procedure: TOTAL KNEE ARTHROPLASTY;  Surgeon: [name], MD;  Location: [facility];  Service: Orthopedics;  Laterality: Right;  Right Total Knee Arthroplasty       Home Medications    Prior to Admission medications  Medication Sig Start Date End Date Taking? Authorizing Provider  clonazePAM (KLONOPIN) 1 MG tablet Take 1 mg by mouth 4 (four) times daily.   Yes [provider]  diclofenac (VOLTAREN) 75 MG EC tablet Take 1 tablet (75 mg total) by mouth 2 (two) times daily. 05/25/16  Yes Idol, Almyra Free, PA-C  escitalopram (LEXAPRO) 20 MG tablet Take 40 mg by mouth daily.   Yes [provider]  gabapentin (NEURONTIN) 400 MG capsule Take 1 capsule (400 mg total) by mouth 4 (four) times daily. For substance withdrawal syndrome 03/29/13  Yes Lindell Spar I, NP  metFORMIN (GLUCOPHAGE) 500 MG tablet Take 1 tablet (500 mg total) by mouth 2 (two) times daily with a meal. 01/15/14  Yes  Philemon Kingdom, MD  mirtazapine (REMERON) 30 MG tablet Take 30 mg by mouth at bedtime.   Yes [provider]  QUEtiapine (SEROQUEL) 200 MG tablet Take 1 tablet (200 mg total) by mouth at bedtime. For mood control 03/29/13  Yes Nwoko, Herbert Pun I, NP  traMADol (ULTRAM) 50 MG tablet Take 1 tablet (50 mg total) by mouth every 6 (six) hours as needed. 05/25/16   Evalee Jefferson, PA-C    Family History Family History  Problem Relation Age of Onset  . Diabetes Mother   . Diabetes Father     Social History Social History  Substance Use Topics  . Smoking status: Current Every Day Smoker    Packs/day: 1.00    Years: 30.00    Types: Cigarettes  . Smokeless tobacco: Never Used     Comment: 04/21/2012 offered smoking cessation materials; pt declines  12/19/2013 DECLINES SMOKING CESSATION MATERIALS  . Alcohol use 0.0 oz/week     Comment: occasionally     Allergies   Benadryl [diphenhydramine hcl] and Trazodone and nefazodone   Review of Systems Review of Systems  Constitutional: Negative for fever.  Respiratory: Negative for shortness of breath.   Cardiovascular: Negative for chest pain.  Gastrointestinal: Negative for abdominal pain.  Musculoskeletal:       Shoulder pain, knee pain, hip pain, elbow pain  Skin: Negative for wound.  All other systems reviewed and are negative.    Physical Exam Updated Vital Signs BP 124/87 (BP Location: Left Arm)   Pulse 81   Temp 98 F (36.7 C) (Oral)   Resp 17   Ht 6\' 3"  (1.905 m)   Wt 99.8 kg (220 lb)   SpO2 99%   BMI 27.50 kg/m   Physical Exam  Constitutional: He is oriented to person, place, and time. He appears well-developed and well-nourished.  HENT:  Head: Normocephalic and atraumatic.  Eyes:  Pupils 3 mm and reactive bilaterally  Neck: Neck supple.  No midline C-spine tenderness to palpation  Cardiovascular: Normal rate, regular rhythm and normal heart sounds.   No murmur heard. Pulmonary/Chest: Effort normal and breath  sounds normal. No respiratory distress. He has no wheezes.  Abdominal: Soft. Bowel sounds are normal. There is no tenderness. There is no rebound.  Musculoskeletal: He exhibits no edema.  Pain with range of motion of the right shoulder, no obvious deformities, 2+ radial pulse, no significant pain with range of motion of the left shoulder, there is pain with range of motion of the left elbow, no obvious deformities and 2+ radial pulse, normal range of motion of bilateral hips and knees, no obvious overlying skin changes, no swelling, no obvious deformities  Neurological: He is alert and oriented to person, place, and time.  Skin: Skin is  warm and dry.  Psychiatric: He has a normal mood and affect.  Nursing note and vitals reviewed.    ED Treatments / Results  Labs (all labs ordered are listed, but only abnormal results are displayed) Labs Reviewed  CBG MONITORING, ED - Abnormal; Notable for the following:       Result Value   Glucose-Capillary 307 (*)    All other components within normal limits    EKG  EKG Interpretation None       Radiology Dg Cervical Spine Complete  Result Date: 07/09/2016 CLINICAL DATA:  Cervical neck pain after fall. EXAM: CERVICAL SPINE - COMPLETE 4+ VIEW COMPARISON:  CT cervical spine 11/03/2015 FINDINGS: Chronic retrolisthesis of C3 on C4. Alignment is otherwise maintained. No evidence of acute fracture. Vertebral body heights are preserved. Disc space narrowing and endplate spurring is most prominent at C5-C6. There is bony neural foraminal stenosis at C3-C4 and C4-C5 bilaterally. Lateral masses of C1 are well aligned on C2. No prevertebral soft tissue edema. IMPRESSION: Degenerative change in the cervical spine without radiographic findings of acute fracture. Electronically Signed   By: Jeb Levering M.D.   On: 07/09/2016 03:04   Dg Shoulder Right  Result Date: 07/09/2016 CLINICAL DATA:  Right shoulder pain after fall. EXAM: RIGHT SHOULDER - 2+ VIEW  COMPARISON:  None. FINDINGS: There is no evidence of fracture or dislocation. Mild degenerative change of the acromioclavicular joint. Soft tissues are unremarkable. IMPRESSION: No fracture or dislocation of the right shoulder. Electronically Signed   By: Jeb Levering M.D.   On: 07/09/2016 02:54   Dg Elbow Complete Left  Result Date: 07/09/2016 CLINICAL DATA:  Left elbow pain after fall. EXAM: LEFT ELBOW - COMPLETE 3+ VIEW COMPARISON:  None. FINDINGS: There is no evidence of fracture, dislocation, or joint effusion. There is no evidence of arthropathy or other focal bone abnormality. Soft tissues are unremarkable. IMPRESSION: Negative radiographs of the left elbow. Electronically Signed   By: Jeb Levering M.D.   On: 07/09/2016 03:02   Dg Shoulder Left  Result Date: 07/09/2016 CLINICAL DATA:  Left shoulder pain after fall. EXAM: LEFT SHOULDER - 2+ VIEW COMPARISON:  None. FINDINGS: There is no evidence of fracture or dislocation. Moderate osteoarthritis of the acromioclavicular joint. Soft tissues are unremarkable. IMPRESSION: No fracture or dislocation of the left shoulder. Electronically Signed   By: Jeb Levering M.D.   On: 07/09/2016 02:56   Dg Knee Complete 4 Views Left  Result Date: 07/09/2016 CLINICAL DATA:  Left knee pain after fall. EXAM: LEFT KNEE - COMPLETE 4+ VIEW COMPARISON:  None. FINDINGS: Left knee arthroplasty in expected alignment. No periprosthetic lucency. There is been patellar resurfacing. Small joint effusion. Mild anterior soft tissue edema. IMPRESSION: Intact total knee arthroplasty without acute fracture. Mild soft tissue edema. Electronically Signed   By: Jeb Levering M.D.   On: 07/09/2016 02:55   Dg Knee Complete 4 Views Right  Result Date: 07/09/2016 CLINICAL DATA:  Right knee pain after fall. EXAM: RIGHT KNEE - COMPLETE 4+ VIEW COMPARISON:  None. FINDINGS: Right knee arthroplasty in expected alignment. No periprosthetic lucency or fracture. There is been  patellar resurfacing. Trace joint effusion. There is anterior soft tissue edema. IMPRESSION: Right knee arthroplasty without complication or acute fracture. Mild soft tissue edema. Electronically Signed   By: Jeb Levering M.D.   On: 07/09/2016 02:57   Dg Hip Unilat W Or Wo Pelvis 2-3 Views Right  Result Date: 07/09/2016 CLINICAL DATA:  Right hip pain after fall.  EXAM: DG HIP (WITH OR WITHOUT PELVIS) 2-3V RIGHT COMPARISON:  None. FINDINGS: Right hip total arthroplasty in expected alignment. No periprosthetic lucency or fracture. The remainder the bony pelvis is intact. Pubic rami are intact. Pubic symphysis and sacroiliac joints are congruent. 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 IMPRESSION: Right hip arthroplasty without complication or periprosthetic fracture. Electronically Signed   By: Jeb Levering M.D.   On: 07/09/2016 02:58    Procedures Procedures (including critical care time)  Medications Ordered in ED Medications  acetaminophen (TYLENOL) tablet 650 mg (650 mg Oral Given 07/09/16 0023)     Initial Impression / Assessment and Plan / ED Course  I have reviewed the triage vital signs and the nursing notes.  Pertinent labs & imaging results that were available during my care of the patient were reviewed by me and considered in my medical decision making (see chart for details).     Patient presents following a reported mechanical fall. He is nontoxic-appearing. Vital signs reassuring. Reports alcohol use in addition to his home medications. Nonfocal. Glucose noted to be 307. He has multiple musculoskeletal complaints without obvious deformities.  Patient given Tylenol for pain. X-rays obtained. These are negative for acute injury. Patient ambulates But reports that his knees feel like they're in a buckle. He is able to bear weight.  3:28 AM I was present for ambulation of the patient. He bears weight without difficulty. He did "buckle" with ambulation which he says was  because of pain but was able to stay upright. He is requesting narcotic pain medication.  Patient had a mechanical fall that was related to medication and alcohol home. He also has a documented history of polysubstance abuse. I discussed with the patient given that he does not have an apparent acute injury, he can use ibuprofen or Tylenol for pain.  After history, exam, and medical workup I feel the patient has been appropriately medically screened and is safe for discharge home. Pertinent diagnoses were discussed with the patient. Patient was given return precautions.   Final Clinical Impressions(s) / ED Diagnoses   Final diagnoses:  Fall  Acute pain of both shoulders  Acute pain of both knees    New Prescriptions New Prescriptions   No medications on file     Merryl Hacker, MD 07/09/16 0932    Merryl Hacker, MD 07/09/16 Danelle Earthly    Merryl Hacker, MD 07/09/16 [phone]

## 2016-07-09 NOTE — Discharge Instructions (Signed)
You were seen today for a fall. Your x-rays are all negative.  Be careful combining alcohol and medications at home as this can make you prone to fall. Follow-up with your primary physician.

## 2016-07-09 NOTE — ED Notes (Signed)
Patient was transported via wheelchair to waiting room to await his ride, no distress noted, patient was given non-skid socks to reduce risk of falls.

## 2016-08-03 IMAGING — CR DG ANKLE COMPLETE 3+V*L*
3 series · 3 of 3 positions shown · non-contrast
Comparison: 01/30/2014

CLINICAL DATA: Left foot and ankle pain, twisted left ankle
yesterday, swelling and pain medial and lateral malleolus.

EXAM:
LEFT ANKLE COMPLETE - 3+ VIEW

[view not recorded (1 of 3)]
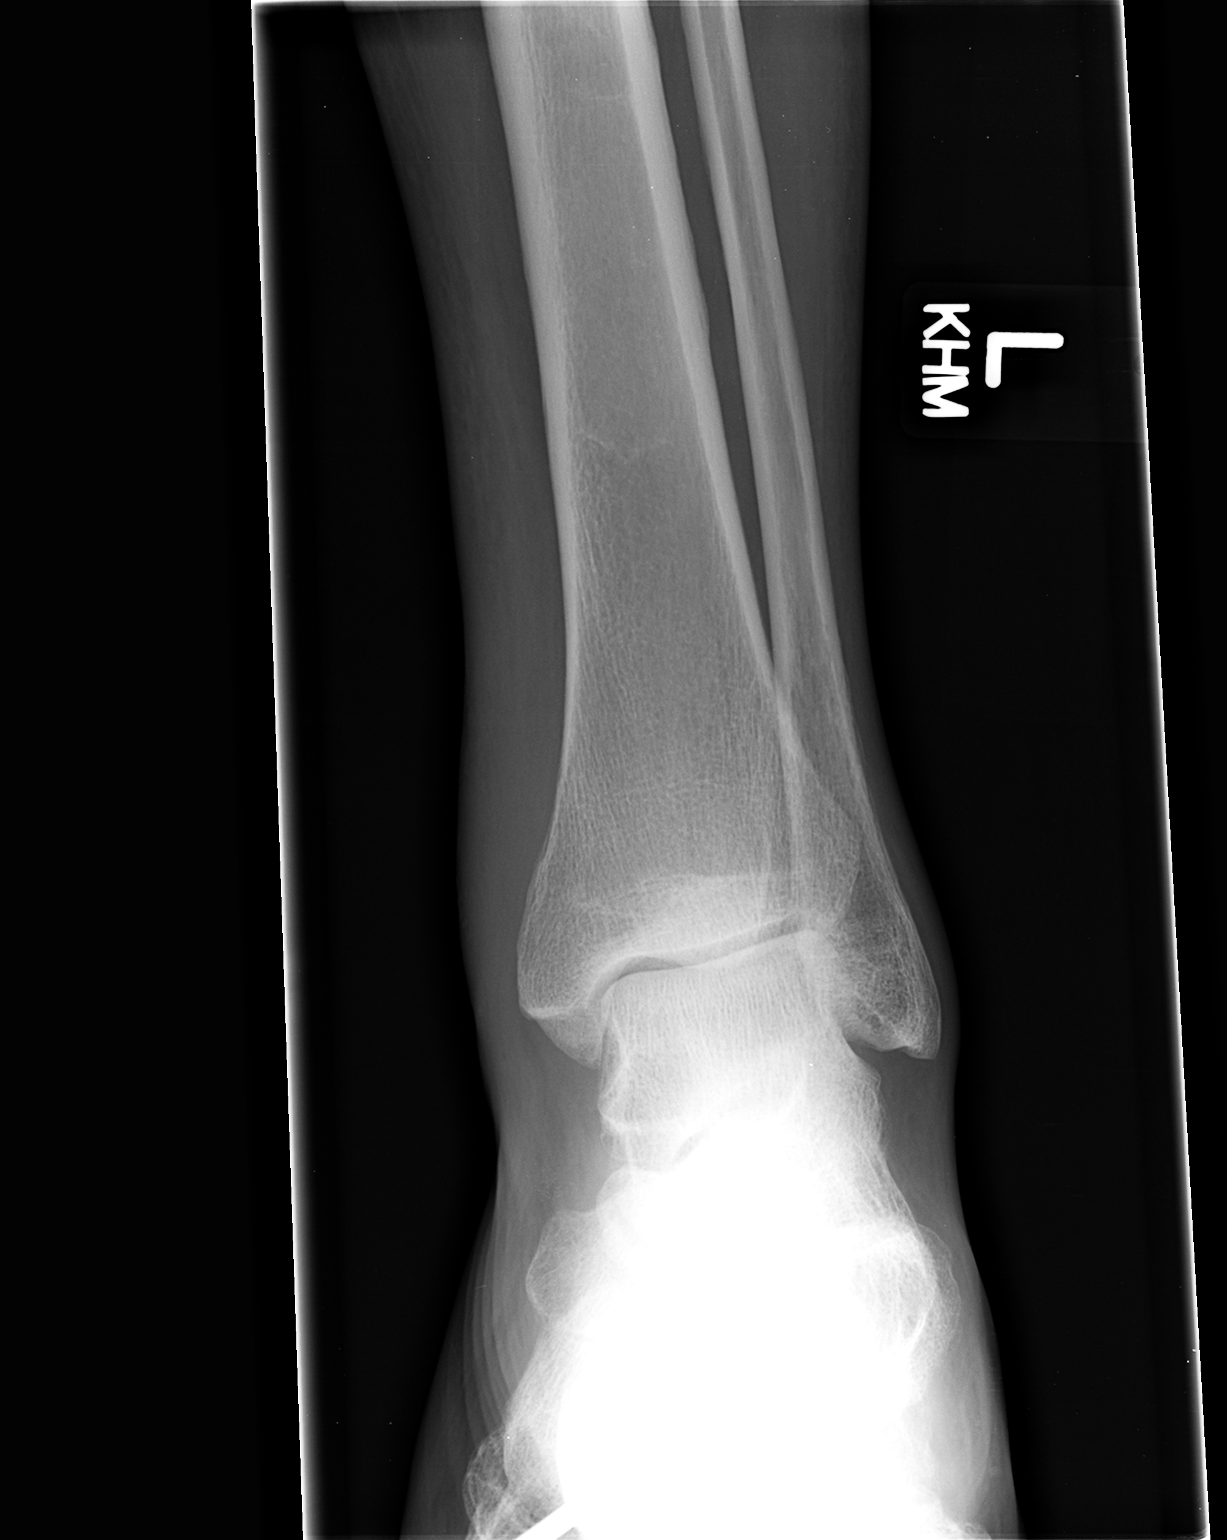

[view not recorded (2 of 3)]
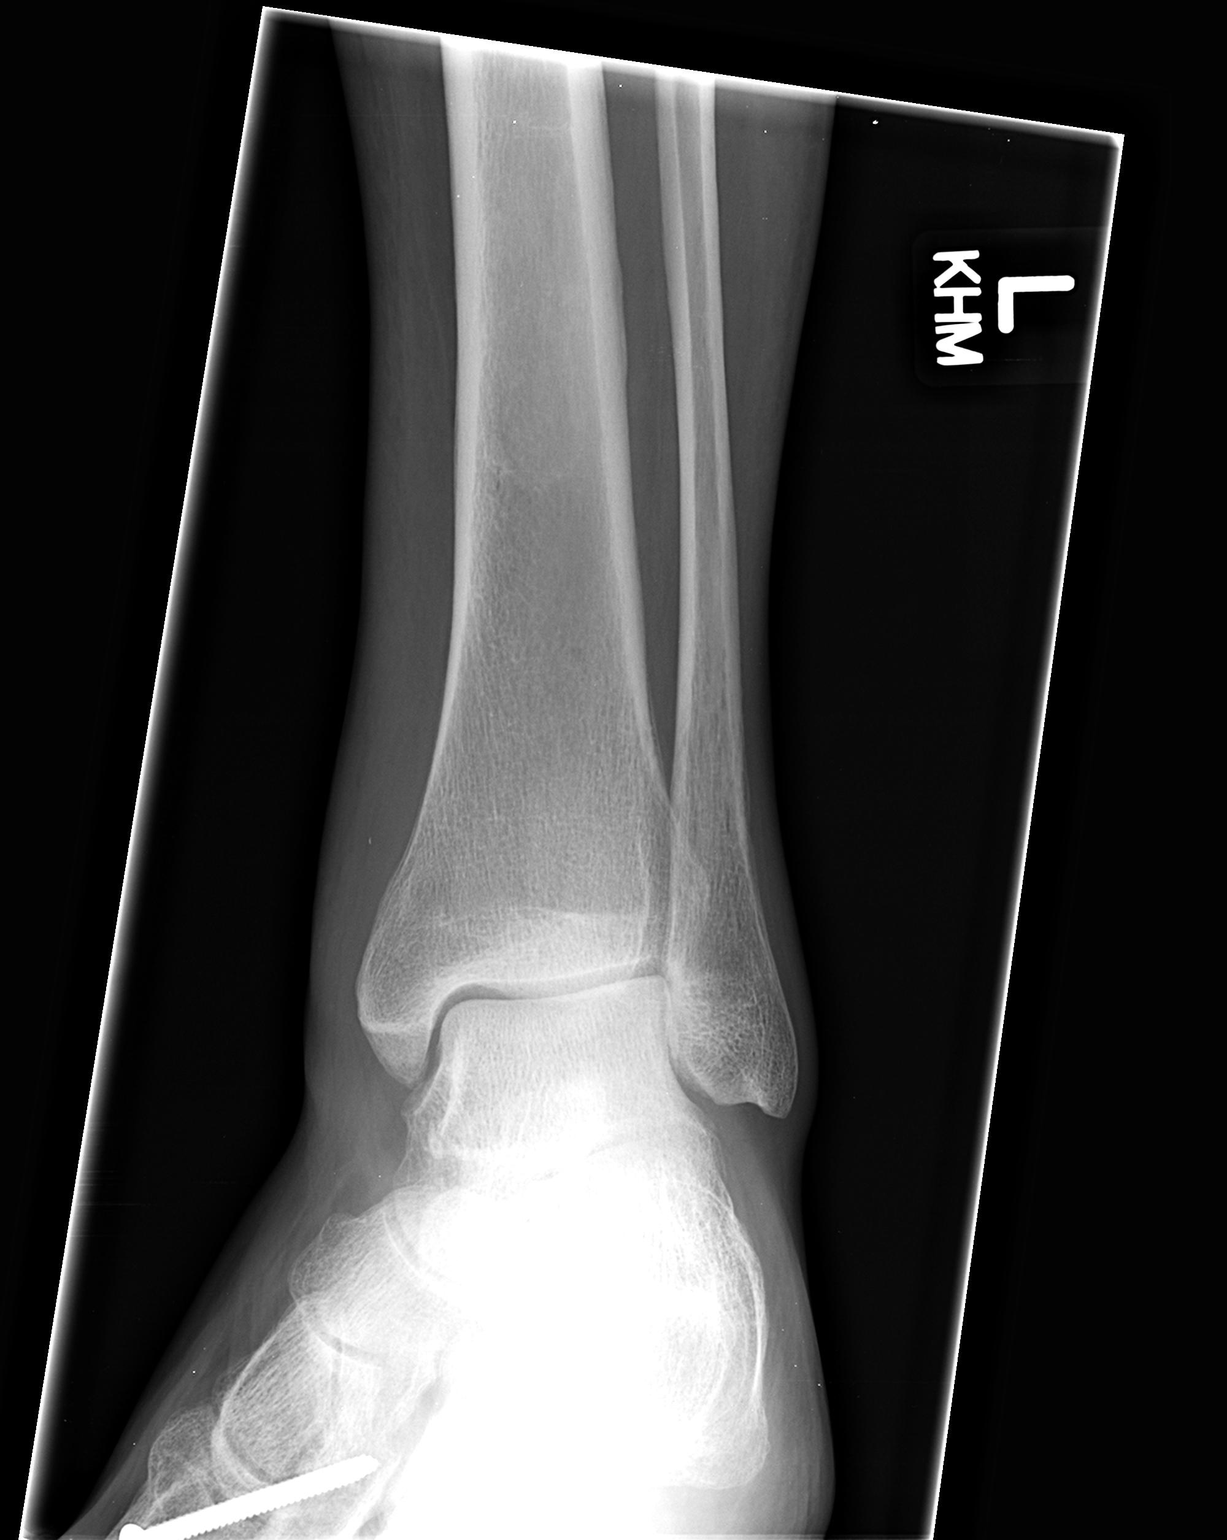

[view not recorded (3 of 3)]
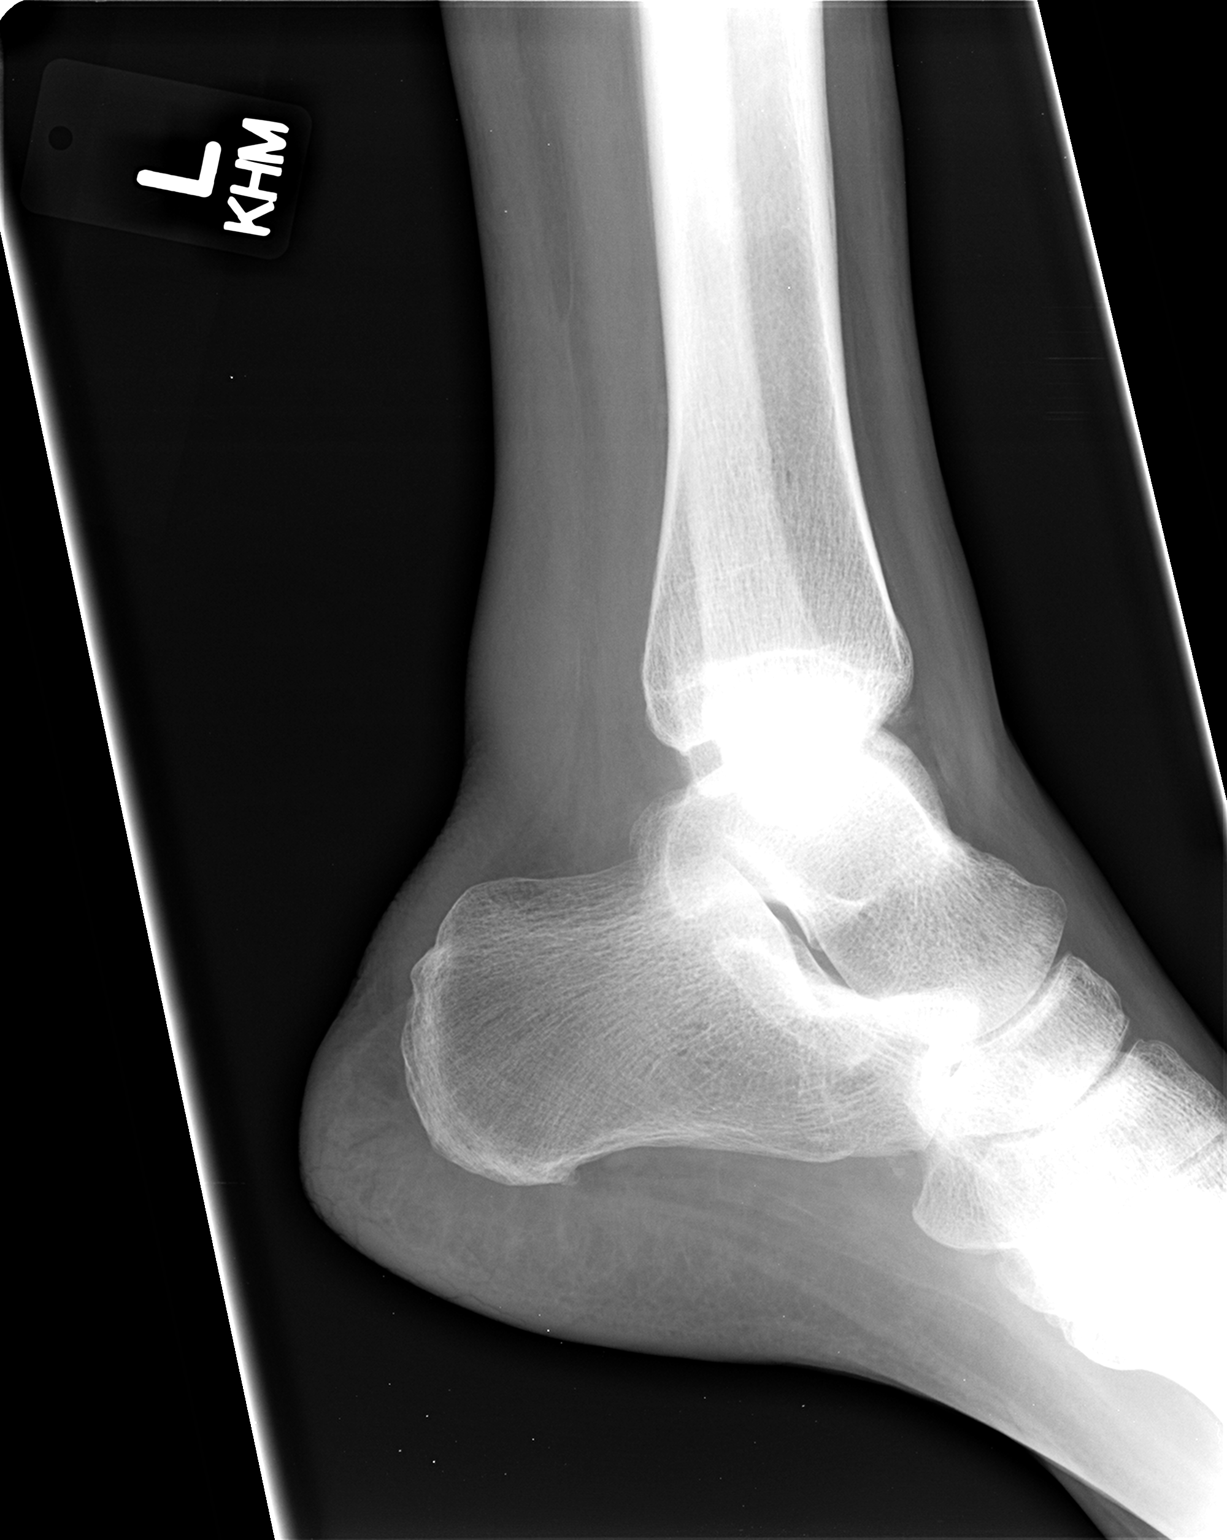

[3 of 3 positions shown; findings below may reference images not displayed]

FINDINGS: Three views of left ankle submitted. No acute fracture or
subluxation. Ankle mortise is preserved. There is mild soft tissue
swelling adjacent to medial malleolus. Tiny plantar spur of
calcaneus.
IMPRESSION: No acute fracture or subluxation. Mild soft tissue swelling adjacent
to medial malleolus.

## 2016-08-16 ENCOUNTER — Emergency Department (HOSPITAL_COMMUNITY): Payer: Medicare Other

## 2016-08-16 ENCOUNTER — Observation Stay (HOSPITAL_COMMUNITY)
Admission: EM | Admit: 2016-08-16 | Discharge: 2016-08-18 | Disposition: A | Discharge status: Home / Self Care | Payer: Medicare Other | Attending: Internal Medicine | Admitting: Internal Medicine

## 2016-08-16 ENCOUNTER — Encounter (HOSPITAL_COMMUNITY): Payer: Self-pay | Admitting: *Deleted

## 2016-08-16 DIAGNOSIS — Z7984 Long term (current) use of oral hypoglycemic drugs: Secondary | ICD-10-CM | POA: Insufficient documentation

## 2016-08-16 DIAGNOSIS — F1020 Alcohol dependence, uncomplicated: Secondary | ICD-10-CM | POA: Diagnosis present

## 2016-08-16 DIAGNOSIS — Y999 Unspecified external cause status: Secondary | ICD-10-CM | POA: Diagnosis not present

## 2016-08-16 DIAGNOSIS — S80211A Abrasion, right knee, initial encounter: Secondary | ICD-10-CM | POA: Insufficient documentation

## 2016-08-16 DIAGNOSIS — Z96641 Presence of right artificial hip joint: Secondary | ICD-10-CM | POA: Diagnosis not present

## 2016-08-16 DIAGNOSIS — E119 Type 2 diabetes mellitus without complications: Secondary | ICD-10-CM | POA: Insufficient documentation

## 2016-08-16 DIAGNOSIS — F1010 Alcohol abuse, uncomplicated: Secondary | ICD-10-CM | POA: Diagnosis not present

## 2016-08-16 DIAGNOSIS — Z79899 Other long term (current) drug therapy: Secondary | ICD-10-CM | POA: Diagnosis not present

## 2016-08-16 DIAGNOSIS — W19XXXA Unspecified fall, initial encounter: Secondary | ICD-10-CM | POA: Diagnosis not present

## 2016-08-16 DIAGNOSIS — Y929 Unspecified place or not applicable: Secondary | ICD-10-CM | POA: Insufficient documentation

## 2016-08-16 DIAGNOSIS — G8929 Other chronic pain: Secondary | ICD-10-CM | POA: Insufficient documentation

## 2016-08-16 DIAGNOSIS — R55 Syncope and collapse: Principal | ICD-10-CM | POA: Insufficient documentation

## 2016-08-16 DIAGNOSIS — Z96653 Presence of artificial knee joint, bilateral: Secondary | ICD-10-CM | POA: Diagnosis not present

## 2016-08-16 DIAGNOSIS — I471 Supraventricular tachycardia: Secondary | ICD-10-CM | POA: Diagnosis present

## 2016-08-16 DIAGNOSIS — Y9301 Activity, walking, marching and hiking: Secondary | ICD-10-CM | POA: Insufficient documentation

## 2016-08-16 LAB — TROPONIN I

## 2016-08-16 LAB — COMPREHENSIVE METABOLIC PANEL
ALT: 35 U/L (ref 17–63)
AST: 30 U/L (ref 15–41)
Albumin: 4.3 g/dL (ref 3.5–5.0)
Alkaline Phosphatase: 103 U/L (ref 38–126)
Anion gap: 18 — ABNORMAL HIGH (ref 5–15)
BUN: 15 mg/dL (ref 6–20)
CHLORIDE: 99 mmol/L — AB (ref 101–111)
CO2: 17 mmol/L — ABNORMAL LOW (ref 22–32)
Calcium: 9.5 mg/dL (ref 8.9–10.3)
Creatinine, Ser: 1.1 mg/dL (ref 0.61–1.24)
Glucose, Bld: 382 mg/dL — ABNORMAL HIGH (ref 65–99)
POTASSIUM: 4 mmol/L (ref 3.5–5.1)
Sodium: 134 mmol/L — ABNORMAL LOW (ref 135–145)
TOTAL PROTEIN: 7.8 g/dL (ref 6.5–8.1)
Total Bilirubin: 0.7 mg/dL (ref 0.3–1.2)

## 2016-08-16 LAB — RAPID URINE DRUG SCREEN, HOSP PERFORMED
Amphetamines: NOT DETECTED
Barbiturates: NOT DETECTED
Benzodiazepines: NOT DETECTED
Cocaine: POSITIVE — AB
OPIATES: NOT DETECTED
TETRAHYDROCANNABINOL: NOT DETECTED

## 2016-08-16 LAB — CBC WITH DIFFERENTIAL/PLATELET
Basophils Absolute: 0.1 10*3/uL (ref 0.0–0.1)
Basophils Relative: 0 %
EOS ABS: 0.2 10*3/uL (ref 0.0–0.7)
EOS PCT: 1 %
HCT: 48.3 % (ref 39.0–52.0)
Hemoglobin: 16.7 g/dL (ref 13.0–17.0)
LYMPHS PCT: 27 %
Lymphs Abs: 4.1 10*3/uL — ABNORMAL HIGH (ref 0.7–4.0)
MCH: 29.6 pg (ref 26.0–34.0)
MCHC: 34.6 g/dL (ref 30.0–36.0)
MCV: 85.6 fL (ref 78.0–100.0)
MONO ABS: 1.1 10*3/uL — AB (ref 0.1–1.0)
MONOS PCT: 7 %
NEUTROS ABS: 9.5 10*3/uL — AB (ref 1.7–7.7)
Neutrophils Relative %: 65 %
PLATELETS: 145 10*3/uL — AB (ref 150–400)
RBC: 5.64 MIL/uL (ref 4.22–5.81)
RDW: 16.2 % — AB (ref 11.5–15.5)
WBC: 14.9 10*3/uL — AB (ref 4.0–10.5)

## 2016-08-16 LAB — MAGNESIUM: Magnesium: 2 mg/dL (ref 1.7–2.4)

## 2016-08-16 LAB — ETHANOL: ALCOHOL ETHYL (B): 32 mg/dL — AB (ref <5)

## 2016-08-16 MED ORDER — THIAMINE HCL 100 MG/ML IJ SOLN: 100.0000 mg | Freq: Every day | INTRAMUSCULAR | Status: DC

## 2016-08-16 MED ORDER — VITAMIN B-1 100 MG PO TABS
100.0000 mg | ORAL_TABLET | Freq: Every day | ORAL | Status: DC
  Administered 2016-08-17 – 2016-08-18 (×2): 100 mg via ORAL
  Filled 2016-08-16 (×2): qty 1

## 2016-08-16 MED ORDER — LORAZEPAM 1 MG PO TABS: 0.0000 mg | ORAL_TABLET | Freq: Two times a day (BID) | ORAL | Status: DC

## 2016-08-16 MED ORDER — SODIUM CHLORIDE 0.9 % IV BOLUS (SEPSIS)
1000.0000 mL | Freq: Once | INTRAVENOUS | Status: AC
  Administered 2016-08-16: 1000 mL via INTRAVENOUS

## 2016-08-16 MED ORDER — ADENOSINE 6 MG/2ML IV SOLN
6.0000 mg | Freq: Once | INTRAVENOUS | Status: AC
  Administered 2016-08-16: 6 mg via INTRAVENOUS

## 2016-08-16 MED ORDER — LORAZEPAM 1 MG PO TABS
0.0000 mg | ORAL_TABLET | Freq: Four times a day (QID) | ORAL | Status: DC
  Administered 2016-08-16: 1 mg via ORAL
  Administered 2016-08-17 – 2016-08-18 (×3): 2 mg via ORAL
  Filled 2016-08-16: qty 1
  Filled 2016-08-16 (×3): qty 2

## 2016-08-16 MED ORDER — LORAZEPAM 2 MG/ML IJ SOLN: 0.0000 mg | Freq: Two times a day (BID) | INTRAMUSCULAR | Status: DC

## 2016-08-16 MED ORDER — ADENOSINE 6 MG/2ML IV SOLN
INTRAVENOUS | Status: AC
  Filled 2016-08-16: qty 8

## 2016-08-16 MED ORDER — IBUPROFEN 400 MG PO TABS
400.0000 mg | ORAL_TABLET | Freq: Once | ORAL | Status: AC
  Administered 2016-08-16: 400 mg via ORAL
  Filled 2016-08-16: qty 1

## 2016-08-16 MED ORDER — LORAZEPAM 2 MG/ML IJ SOLN
0.0000 mg | Freq: Four times a day (QID) | INTRAMUSCULAR | Status: DC
  Administered 2016-08-17 – 2016-08-18 (×2): 2 mg via INTRAVENOUS
  Filled 2016-08-16 (×2): qty 1

## 2016-08-16 NOTE — ED Notes (Signed)
Checked on patient for a urine sample,patient doesn't have to go right now,will check back in 30 minutes.

## 2016-08-16 NOTE — ED Provider Notes (Signed)
[facility] DEPT Provider Note   CSN: [id number] Arrival date & time: 08/16/16  1847     History   Chief Complaint Chief Complaint  Patient presents with  . Fall  . Loss of Consciousness    HPI [name] is a 51 y.o. male.  HPI Pt was seen at 1925. Per pt, c/o sudden onset and resolution of one episode of syncope that occurred PTA. Pt states he was "working under the car all day," and "feels dehydrated." Pt states when he came out from underneath the car and was walking into his house he felt lightheaded, had palpitations, and "then woke up on the ground." Pt states he believes he fell to his knees because his bilat knees and right hip "hurt." Denies CP/SOB, no cough, no neck or back pain, no abd pain, no N/V/D, no focal motor weakness, no tingling/numbness in extremities.   Past Medical History:  Diagnosis Date  . Anxiety   . Arthritis    "everywhere" (04/21/2012)  . Cellulitis and abscess of foot 12/19/2013   LEFT FOOT  . Chronic pain   . DDD (degenerative disc disease)   . Depression   . Diabetes mellitus without complication (HCC)    borderline  . Diabetic foot ulcer ([place])   . ETOH abuse   . GERD (gastroesophageal reflux disease)    tums  . History of blood transfusion    "related to left knee OR; probably right hip too" (04/21/2012)  . Mental disorder   . Neuromuscular disorder (HCC)    neuropathy  . Noncompliance   . Open wound    bottom of foot  . Pneumonia ~ 2012  . Polysubstance abuse    etoh, cocaine, heroin  . Stroke [facility]) 2008   "they said I might have had one during right hip replacement" (04/21/2012)    Patient Active Problem List   Diagnosis Date Noted  . Tobacco use disorder 10/25/2014  . Diabetes mellitus type 2 in nonobese (HCC)   . Swelling   . Type 2 diabetes mellitus with left diabetic foot ulcer ([place]) 12/18/2013  . Diabetes mellitus due to underlying condition with foot ulcer ([place]) 12/18/2013  . Left leg cellulitis 12/18/2013  .  Cellulitis of right lower extremity 07/19/2013  . Cocaine abuse 03/29/2012  . Generalized anxiety disorder 03/29/2012  . Panic attacks 03/29/2012  . Alcohol dependence ([place]) 08/05/2011    Class: Chronic  . Substance abuse 05/31/2011  . Foot ulcer, left ([place]) 02/15/2011  . Fasting hyperglycemia 02/15/2011  . Neuropathic ulcer of foot ([place]) 02/15/2011    Past Surgical History:  Procedure Laterality Date  . JOINT REPLACEMENT    . KNEE ARTHROSCOPY Bilateral 1980's/1990's  . LUNG LOBECTOMY Left ~ 2006  . LUNG LOBECTOMY    . METATARSAL OSTEOTOMY  10/29/2011   Procedure: METATARSAL OSTEOTOMY;  Surgeon: [name], MD;  Location: [facility];  Service: Orthopedics;  Laterality: Left;  Left 1st Metatarsal Dorsal Closing Wedge   . REVISION TOTAL HIP ARTHROPLASTY Right 2008   "4-5 months after replacement" (04/21/2012)  . TOTAL HIP ARTHROPLASTY Right 2008  . TOTAL KNEE ARTHROPLASTY Left 2006  . TOTAL KNEE ARTHROPLASTY Right 04/20/2012  . TOTAL KNEE ARTHROPLASTY Right 04/20/2012   Procedure: TOTAL KNEE ARTHROPLASTY;  Surgeon: [name], MD;  Location: [facility];  Service: Orthopedics;  Laterality: Right;  Right Total Knee Arthroplasty       Home Medications    Prior to Admission medications   Medication Sig Start Date End  Date Taking? Authorizing Provider  escitalopram (LEXAPRO) 20 MG tablet Take 20 mg by mouth daily.    Yes [provider]  gabapentin (NEURONTIN) 400 MG capsule Take 1 capsule (400 mg total) by mouth 4 (four) times daily. For substance withdrawal syndrome Patient taking differently: Take 400 mg by mouth 3 (three) times daily. For substance withdrawal syndrome 03/29/13  Yes Lindell Spar I, NP  ibuprofen (ADVIL,MOTRIN) 200 MG tablet Take 400 mg by mouth every 6 (six) hours as needed for headache or moderate pain.   Yes [provider]  metFORMIN (GLUCOPHAGE) 500 MG tablet Take 1 tablet (500 mg total) by mouth 2 (two) times daily with a meal. 01/15/14  Yes Philemon Kingdom, MD  mirtazapine (REMERON) 30 MG tablet Take 30 mg by mouth at bedtime.   Yes [provider]  QUEtiapine (SEROQUEL) 200 MG tablet Take 1 tablet (200 mg total) by mouth at bedtime. For mood control Patient taking differently: Take 100 mg by mouth at bedtime. For mood control 03/29/13  Yes Encarnacion Slates, NP    Family History Family History  Problem Relation Age of Onset  . Diabetes Mother   . Diabetes Father     Social History Social History  Substance Use Topics  . Smoking status: Current Every Day Smoker    Packs/day: 1.00    Years: 30.00    Types: Cigarettes  . Smokeless tobacco: Never Used     Comment: 04/21/2012 offered smoking cessation materials; pt declines  12/19/2013 DECLINES SMOKING CESSATION MATERIALS  . Alcohol use 0.0 oz/week     Comment: occasionally     Allergies   Benadryl [diphenhydramine hcl] and Trazodone and nefazodone   Review of Systems Review of Systems ROS: Statement: All systems negative except as marked or noted in the HPI; Constitutional: Negative for fever and chills. ; ; Eyes: Negative for eye pain, redness and discharge. ; ; ENMT: Negative for ear pain, hoarseness, nasal congestion, sinus pressure and sore throat. ; ; Cardiovascular: +palpitations. Negative for chest pain, diaphoresis, dyspnea and peripheral edema. ; ; Respiratory: Negative for cough, wheezing and stridor. ; ; Gastrointestinal: Negative for nausea, vomiting, diarrhea, abdominal pain, blood in stool, hematemesis, jaundice and rectal bleeding. . ; ; Genitourinary: Negative for dysuria, flank pain and hematuria. ; ; Musculoskeletal: +knees pain, right hip pain. Negative for back pain and neck pain. Negative for swelling and deformity.; ; Skin: +right knee abrasions. Negative for pruritus, rash, blisters, bruising and skin lesion.; ; Neuro: Negative for headache and neck stiffness. Negative for weakness, altered level of consciousness, altered mental status, extremity  weakness, paresthesias, involuntary movement, seizure and +lightheadedness, +syncope.       Physical Exam Updated Vital Signs BP (!) 130/91   Pulse (!) 198   Temp 98.6 F (37 C) (Oral)   Resp (!) 22   SpO2 94%    Patient Vitals for the past 24 hrs:  BP Temp Temp src Pulse Resp SpO2  08/16/16 2139 130/90 - - 100 19 95 %  08/16/16 2108 124/80 - - (!) 103 (!) 23 94 %  08/16/16 2000 115/79 - - (!) 109 (!) 27 94 %  08/16/16 1934 - 98.6 F (37 C) Oral - - -  08/16/16 1930 (!) 130/91 - Oral (!) 198 (!) 22 94 %      Physical Exam 1930: Physical examination:  Nursing notes reviewed; Vital signs and O2 SAT reviewed;  Constitutional: Well developed, Well nourished, Well hydrated, In no acute distress; Head:  Normocephalic, atraumatic; Eyes: EOMI, PERRL, No scleral icterus; ENMT: Mouth and pharynx normal, Mucous membranes moist; Neck: Supple, Full range of motion, No lymphadenopathy; Cardiovascular: Tachycardic, regular rate and rhythm, No gallop; Respiratory: Breath sounds clear & equal bilaterally, No  wheezes. Speaking full sentences with ease, Normal respiratory effort/excursion; Chest: Nontender, Movement normal; Abdomen: Soft, Nontender, Nondistended, Normal bowel sounds; Genitourinary: No CVA tenderness; Extremities: Pulses normal, Pelvis stable. +mild generalized right hip, right and left knees tenderness to palp. +superficial abrasions right patellar areas. +FROM right and left knees,  including able to lift extended bilaterl LE off stretcher, and extend bilateral lower leg against resistance.  No ligamentous laxity.  No patellar or quad tendon step-offs.  NMS intact bilateral feet, strong pedal pp. +plantarflexion of right and left foot w/calf squeeze.  No palpable gap right and left Achilles's tendons.  No proximal fibular head tenderness.  No edema, erythema, warmth, ecchymosis or deformity.  No specific area of point tenderness. No calf tenderness, edema or asymmetry.; Neuro: AA&Ox3,  Major CN grossly intact. No facial droop. Speech clear. No gross focal motor or sensory deficits in extremities.; Skin: Color normal, Warm, Dry.    ED Treatments / Results  Labs (all labs ordered are listed, but only abnormal results are displayed)   EKG  EKG Interpretation  Date/Time:  Sunday August 16 2016 19:19:41 EDT Ventricular Rate:  196 PR Interval:    QRS Duration: 72 QT Interval:  250 QTC Calculation: 451 R Axis:   53 Text Interpretation:  Supraventricular tachycardia Nonspecific ST abnormality When compared with ECG of 02/15/2015 Supraventricular tachycardia has replaced Normal sinus rhythm Confirmed by [facility]  MD, Nunzio Cory [phone]) on 08/16/2016 8:31:17 PM        EKG Interpretation  Date/Time:  Sunday August 16 2016 19:34:45 EDT Ventricular Rate:  115 PR Interval:    QRS Duration: 90 QT Interval:  324 QTC Calculation: 449 R Axis:   99 Text Interpretation:  Sinus tachycardia Borderline right axis deviation Borderline low voltage, extremity leads Artifact Since last tracing of earlier today Normal sinus rhythm has replaced Supraventricular tachycardia When compared with ECG of 02/15/2015 Rate faster Confirmed by [facility]  MD, Nunzio Cory [phone]) on 08/16/2016 8:32:31 PM         Radiology    Procedures Procedures (including critical care time)  Medications Ordered in ED Medications  adenosine (ADENOCARD) 6 MG/2ML injection 6 mg (6 mg Intravenous Given 08/16/16 1937)  sodium chloride 0.9 % bolus 1,000 mL (1,000 mLs Intravenous New Bag/Given 08/16/16 2006)     Initial Impression / Assessment and Plan / ED Course  I have reviewed the triage vital signs and the nursing notes.  Pertinent labs & imaging results that were available during my care of the patient were reviewed by me and considered in my medical decision making (see chart for details).  MDM Reviewed: previous chart, nursing note and vitals Reviewed previous: labs and ECG Interpretation: labs, ECG, x-ray  and CT scan Total time providing critical care: 30-74 minutes. This excludes time spent performing separately reportable procedures and services. Consults: admitting MD   CRITICAL CARE Performed by: Alfonzo Feller Total critical care time: 35 minutes Critical care time was exclusive of separately billable procedures and treating other patients. Critical care was necessary to treat or prevent imminent or life-threatening deterioration. Critical care was time spent personally by me on the following activities: development of treatment plan with patient and/or surrogate as well as nursing, discussions with consultants, evaluation of patient's response to treatment, examination  of patient, obtaining history from patient or surrogate, ordering and performing treatments and interventions, ordering and review of laboratory studies, ordering and review of radiographic studies, pulse oximetry and re-evaluation of patient's condition.   Results for orders placed or performed during the hospital encounter of 08/16/16  Ethanol  Result Value Ref Range   Alcohol, Ethyl (B) 32 (H) <5 mg/dL  Comprehensive metabolic panel  Result Value Ref Range   Sodium 134 (L) 135 - 145 mmol/L   Potassium 4.0 3.5 - 5.1 mmol/L   Chloride 99 (L) 101 - 111 mmol/L   CO2 17 (L) 22 - 32 mmol/L   Glucose, Bld 382 (H) 65 - 99 mg/dL   BUN 15 6 - 20 mg/dL   Creatinine, Ser 1.10 0.61 - 1.24 mg/dL   Calcium 9.5 8.9 - 10.3 mg/dL   Total Protein 7.8 6.5 - 8.1 g/dL   Albumin 4.3 3.5 - 5.0 g/dL   AST 30 15 - 41 U/L   ALT 35 17 - 63 U/L   Alkaline Phosphatase 103 38 - 126 U/L   Total Bilirubin 0.7 0.3 - 1.2 mg/dL   GFR calc non Af Amer >60 >60 mL/min   GFR calc Af Amer >60 >60 mL/min   Anion gap 18 (H) 5 - 15  Troponin I  Result Value Ref Range   Troponin I <0.03 <0.03 ng/mL  CBC with Differential  Result Value Ref Range   WBC 14.9 (H) 4.0 - 10.5 K/uL   RBC 5.64 4.22 - 5.81 MIL/uL   Hemoglobin 16.7 13.0 - 17.0 g/dL    HCT 48.3 39.0 - 52.0 %   MCV 85.6 78.0 - 100.0 fL   MCH 29.6 26.0 - 34.0 pg   MCHC 34.6 30.0 - 36.0 g/dL   RDW 16.2 (H) 11.5 - 15.5 %   Platelets 145 (L) 150 - 400 K/uL   Neutrophils Relative % 65 %   Neutro Abs 9.5 (H) 1.7 - 7.7 K/uL   Lymphocytes Relative 27 %   Lymphs Abs 4.1 (H) 0.7 - 4.0 K/uL   Monocytes Relative 7 %   Monocytes Absolute 1.1 (H) 0.1 - 1.0 K/uL   Eosinophils Relative 1 %   Eosinophils Absolute 0.2 0.0 - 0.7 K/uL   Basophils Relative 0 %   Basophils Absolute 0.1 0.0 - 0.1 K/uL    Ct Head Wo Contrast Result Date: 08/16/2016 CLINICAL DATA:  Status post fall.  Dizziness, nausea palpitations. EXAM: CT HEAD WITHOUT CONTRAST TECHNIQUE: Contiguous axial images were obtained from the base of the skull through the vertex without intravenous contrast. COMPARISON:  11/03/2015 FINDINGS: Brain: No evidence of acute infarction, hemorrhage, hydrocephalus, extra-axial collection or mass lesion/mass effect. Moderate brain parenchymal volume loss and microangiopathy. Vascular: No hyperdense vessel or unexpected calcification. Skull: Normal. Negative for fracture or focal lesion. Sinuses/Orbits: Mucous retention cyst in the right maxillary sinus. There remainder of the paranasal sinuses and mastoid air cells are normally aerated. Other: None. IMPRESSION: No acute intracranial abnormality. Moderate brain parenchymal atrophy and chronic microvascular disease. Electronically Signed   By: Fidela Salisbury M.D.   On: 08/16/2016 20:59   Dg Chest 1 View Result Date: 08/16/2016 CLINICAL DATA:  Syncopal episode. EXAM: CHEST 1 VIEW COMPARISON:  02/15/2015 FINDINGS: Electronic apparatus and shock pads overlie the thorax. Cardiomediastinal silhouette is normal. Mediastinal contours appear intact. There is no evidence of focal airspace consolidation, pleural effusion or pneumothorax. Low lung volumes. Chronic lingular scarring. Osseous structures are without acute abnormality. Soft tissues are grossly  normal. IMPRESSION: No active disease. Electronically Signed   By: Fidela Salisbury M.D.   On: 08/16/2016 21:36   Dg Knee Complete 4 Views Left Result Date: 08/16/2016 CLINICAL DATA:  Left knee pain after fall. EXAM: LEFT KNEE - COMPLETE 4+ VIEW COMPARISON:  Radiographs of July 09, 2016. FINDINGS: Status post left knee arthroplasty. The femoral and tibial components appear to be well situated. No fracture or dislocation is noted. No joint effusion is noted. IMPRESSION: Status post left knee arthroplasty.  No acute abnormality is seen. Electronically Signed   By: Marijo Conception, M.D.   On: 08/16/2016 21:42   Dg Knee Complete 4 Views Right Result Date: 08/16/2016 CLINICAL DATA:  Right knee pain after fall. EXAM: RIGHT KNEE - COMPLETE 4+ VIEW COMPARISON:  Radiographs of July 21, 2016. FINDINGS: Status post right total knee arthroplasty. The femoral and tibial prostheses appear to be well situated. No fracture or dislocation is noted. No joint effusion is noted. No soft tissue abnormalities noted. IMPRESSION: Status post right total knee arthroplasty. No acute abnormality is noted. Electronically Signed   By: Marijo Conception, M.D.   On: 08/16/2016 21:37   Dg Hip Unilat With Pelvis 2-3 Views Right Result Date: 08/16/2016 CLINICAL DATA:  Right hip pain after fall today. EXAM: DG HIP (WITH OR WITHOUT PELVIS) 2-3V RIGHT COMPARISON:  Radiographs of July 09, 2016. FINDINGS: Status post right hip arthroplasty. The femoral and acetabular components appear to be well situated. No fracture or dislocation is noted. IMPRESSION: Status post right hip arthroplasty. No acute abnormality seen in the right hip. Electronically Signed   By: Marijo Conception, M.D.   On: 08/16/2016 21:40    1930:  Monitor SVT, rate 190's on arrival. IV adenosine risks/benefits explained to pt; pt verb understanding and agreeable to proceed. IV adenosine 6mg  given with monitor changing to sinus tachycardia then NSR. Pt states he "feels better."   Workup ordered.  2155:  Workup otherwise reassuring. Pt remains sinus tachycardia/NSR, rates 90-100.  BP stable. No hx SVT or syncope, will admit.  Dx and testing d/w pt and family.  Questions answered.  Verb understanding, agreeable to admit.  T/C to [facility] Dr. Darrick Meigs, case discussed, including:  HPI, pertinent PM/SHx, VS/PE, dx testing, ED course and treatment:  Agreeable to admit.      Final Clinical Impressions(s) / ED Diagnoses   Final diagnoses:  None    New Prescriptions New Prescriptions   No medications on file     Francine Graven, DO 08/20/16 3500

## 2016-08-16 NOTE — ED Triage Notes (Signed)
Pt c/o fall, right hip and knee pain,  while during triage pt hr 198, radial pulse noted to be the same, upon further questioning pt c/o dizziness today with nausea, palpitations,

## 2016-08-16 NOTE — ED Notes (Signed)
Pt more agitated, c/o feeling shaky

## 2016-08-17 ENCOUNTER — Observation Stay (HOSPITAL_BASED_OUTPATIENT_CLINIC_OR_DEPARTMENT_OTHER): Payer: Medicare Other

## 2016-08-17 ENCOUNTER — Encounter (HOSPITAL_COMMUNITY): Payer: Self-pay

## 2016-08-17 DIAGNOSIS — R55 Syncope and collapse: Secondary | ICD-10-CM

## 2016-08-17 DIAGNOSIS — I471 Supraventricular tachycardia: Secondary | ICD-10-CM | POA: Diagnosis present

## 2016-08-17 DIAGNOSIS — S80211A Abrasion, right knee, initial encounter: Secondary | ICD-10-CM | POA: Diagnosis not present

## 2016-08-17 DIAGNOSIS — E119 Type 2 diabetes mellitus without complications: Secondary | ICD-10-CM | POA: Diagnosis not present

## 2016-08-17 DIAGNOSIS — F1010 Alcohol abuse, uncomplicated: Secondary | ICD-10-CM | POA: Diagnosis not present

## 2016-08-17 LAB — ECHOCARDIOGRAM COMPLETE
CHL CUP DOP CALC LVOT VTI: 23.9 cm
EERAT: 10.26
EWDT: 239 ms
FS: 44 % (ref 28–44)
Height: 68 in
IV/PV OW: 1.09
LA ID, A-P, ES: 41 mm
LA diam end sys: 41 mm
LA vol A4C: 45.6 ml
LA vol: 57.9 mL
LADIAMINDEX: 1.89 cm/m2
LAVOLIN: 26.6 mL/m2
LV E/e' medial: 10.26
LV SIMPSON'S DISK: 68
LV TDI E'LATERAL: 8.05
LV TDI E'MEDIAL: 6.96
LV dias vol index: 29 mL/m2
LV e' LATERAL: 8.05 cm/s
LV sys vol: 20 mL — AB (ref 21–61)
LVDIAVOL: 63 mL (ref 62–150)
LVEEAVG: 10.26
LVOT SV: 75 mL
LVOT area: 3.14 cm2
LVOT peak grad rest: 8 mmHg
LVOT peak vel: 137 cm/s
LVOTD: 20 mm
LVSYSVOLIN: 9 mL/m2
MV Dec: 239
MV Peak grad: 3 mmHg
MVPKAVEL: 77.1 m/s
MVPKEVEL: 82.6 m/s
PW: 10.5 mm — AB (ref 0.6–1.1)
Stroke v: 43 ml
TAPSE: 21.3 mm
Weight: 3376 oz

## 2016-08-17 LAB — COMPREHENSIVE METABOLIC PANEL
ALT: 27 U/L (ref 17–63)
AST: 23 U/L (ref 15–41)
Albumin: 3.5 g/dL (ref 3.5–5.0)
Alkaline Phosphatase: 84 U/L (ref 38–126)
Anion gap: 9 (ref 5–15)
BILIRUBIN TOTAL: 0.6 mg/dL (ref 0.3–1.2)
BUN: 16 mg/dL (ref 6–20)
CALCIUM: 8.3 mg/dL — AB (ref 8.9–10.3)
CO2: 24 mmol/L (ref 22–32)
Chloride: 103 mmol/L (ref 101–111)
Creatinine, Ser: 0.87 mg/dL (ref 0.61–1.24)
GFR calc Af Amer: >60 mL/min (ref >60)
GLUCOSE: 326 mg/dL — AB (ref 65–99)
POTASSIUM: 3.6 mmol/L (ref 3.5–5.1)
Sodium: 136 mmol/L (ref 135–145)
TOTAL PROTEIN: 6.2 g/dL — AB (ref 6.5–8.1)

## 2016-08-17 LAB — GLUCOSE, CAPILLARY
GLUCOSE-CAPILLARY: 347 mg/dL — AB (ref 65–99)
GLUCOSE-CAPILLARY: 274 mg/dL — AB (ref 65–99)
Glucose-Capillary: 375 mg/dL — ABNORMAL HIGH (ref 65–99)
Glucose-Capillary: 327 mg/dL — ABNORMAL HIGH (ref 65–99)
Glucose-Capillary: 236 mg/dL — ABNORMAL HIGH (ref 65–99)

## 2016-08-17 LAB — CBC
HCT: 44.5 % (ref 39.0–52.0)
Hemoglobin: 14.6 g/dL (ref 13.0–17.0)
MCH: 28.5 pg (ref 26.0–34.0)
MCHC: 32.8 g/dL (ref 30.0–36.0)
MCV: 86.7 fL (ref 78.0–100.0)
Platelets: 120 10*3/uL — ABNORMAL LOW (ref 150–400)
RBC: 5.13 MIL/uL (ref 4.22–5.81)
RDW: 16.6 % — AB (ref 11.5–15.5)
WBC: 7.4 10*3/uL (ref 4.0–10.5)

## 2016-08-17 LAB — TROPONIN I: Troponin I: <0.03 ng/mL (ref <0.03)

## 2016-08-17 IMAGING — CR DG PELVIS 1-2V
1 series · 1 of 1 positions shown · non-contrast
Comparison: RIGHT femur radiographs October 04, 2013

CLINICAL DATA: RIGHT knee gave out and fell on brick sidewalk
tonight. Pain. History of knee replacement.

EXAM:
PELVIS - 1-2 VIEW; RIGHT FEMUR - 2 VIEW

[view not recorded]
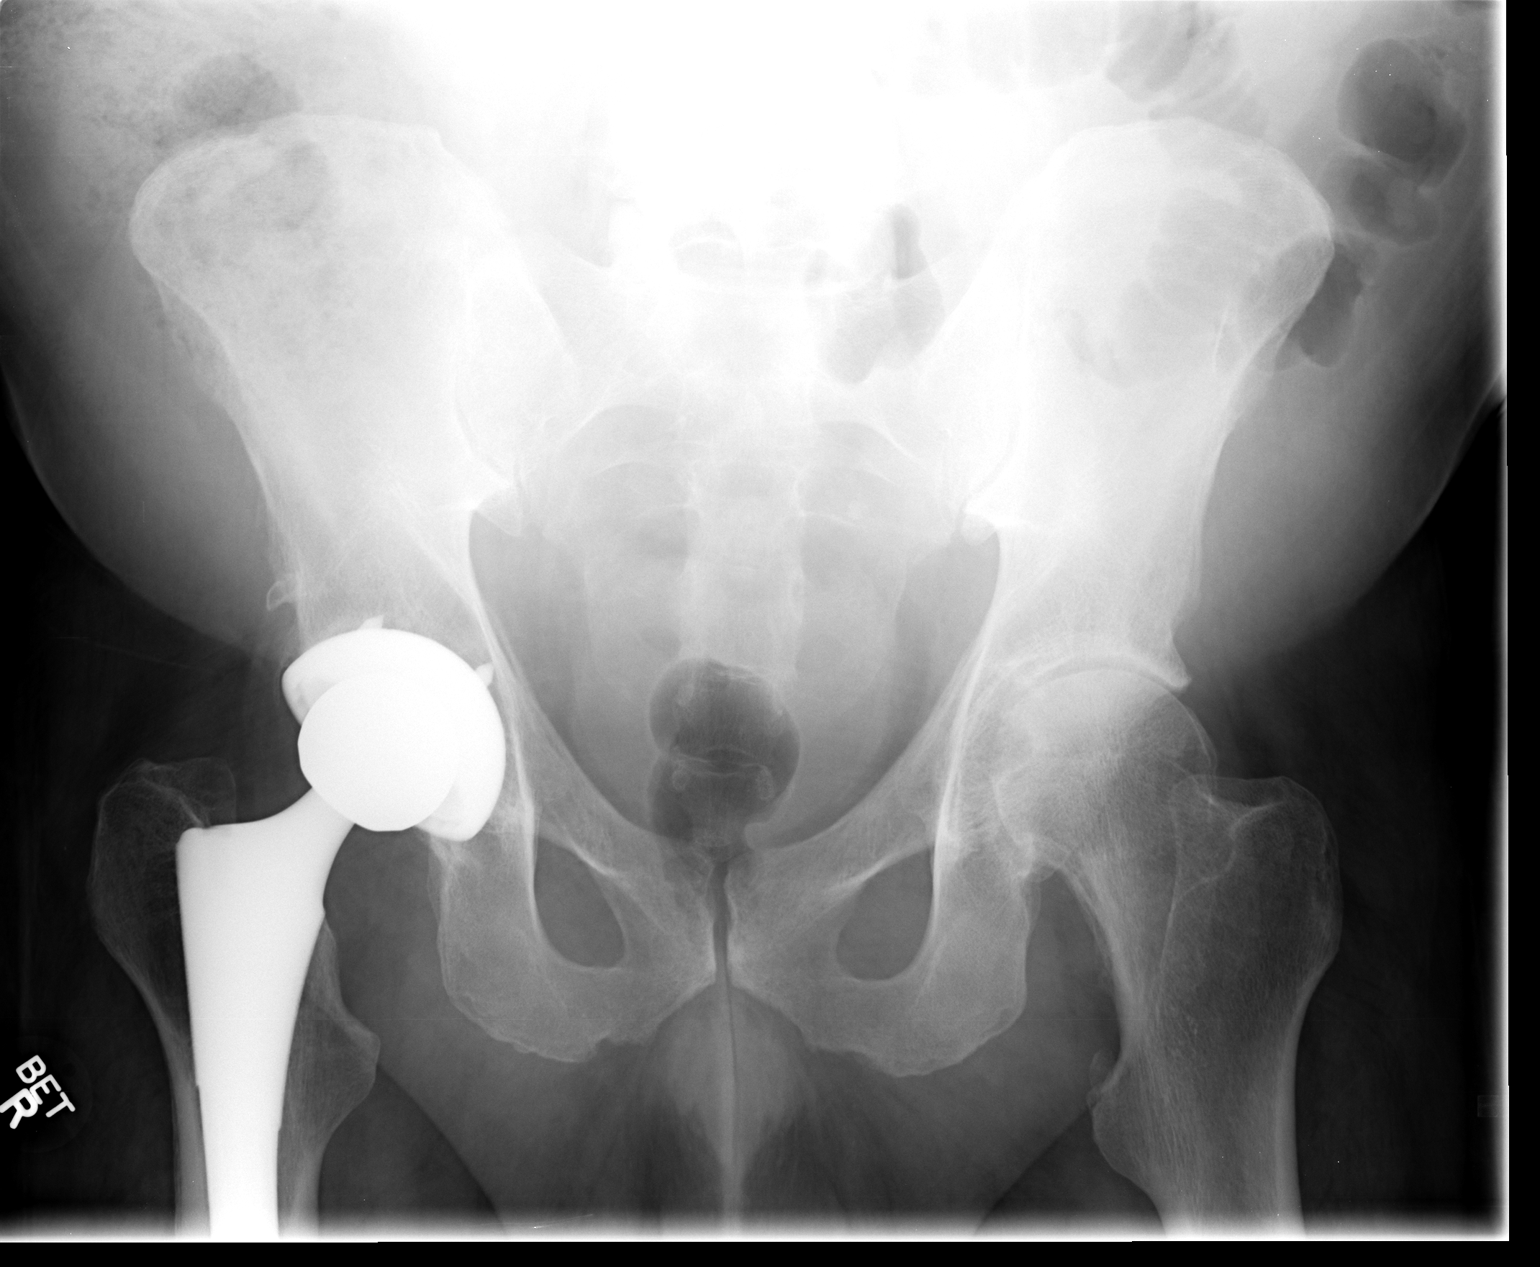

[1 of 1 positions shown; findings below may reference images not displayed]

FINDINGS: No acute fracture deformity. Status post RIGHT hip total
arthroplasty with intact well seated hardware. No periprosthetic
lucency. No destructive bony lesions. Soft tissue planes are
nonsuspicious.

Status post RIGHT knee total arthroplasty, partially characterized.
IMPRESSION: No acute fracture deformity or dislocation.

Status post RIGHT hip total arthroplasty without radiographic
findings of hardware failure.

  By: Niciimasharaf Meeow

## 2016-08-17 MED ORDER — SODIUM CHLORIDE 0.9 % IV SOLN
INTRAVENOUS | Status: DC
  Administered 2016-08-17 (×2): via INTRAVENOUS

## 2016-08-17 MED ORDER — ONDANSETRON HCL 4 MG PO TABS: 4.0000 mg | ORAL_TABLET | Freq: Four times a day (QID) | ORAL | Status: DC | PRN

## 2016-08-17 MED ORDER — IBUPROFEN 400 MG PO TABS
400.0000 mg | ORAL_TABLET | Freq: Once | ORAL | Status: AC
  Administered 2016-08-17: 400 mg via ORAL
  Filled 2016-08-17: qty 1

## 2016-08-17 MED ORDER — INSULIN ASPART 100 UNIT/ML ~~LOC~~ SOLN
0.0000 [IU] | Freq: Three times a day (TID) | SUBCUTANEOUS | Status: DC
  Administered 2016-08-17: 5 [IU] via SUBCUTANEOUS
  Administered 2016-08-17 – 2016-08-18 (×4): 7 [IU] via SUBCUTANEOUS

## 2016-08-17 MED ORDER — DILTIAZEM HCL 30 MG PO TABS
30.0000 mg | ORAL_TABLET | Freq: Two times a day (BID) | ORAL | Status: DC
  Administered 2016-08-17 – 2016-08-18 (×3): 30 mg via ORAL
  Filled 2016-08-17 (×3): qty 1

## 2016-08-17 MED ORDER — ONDANSETRON HCL 4 MG/2ML IJ SOLN: 4.0000 mg | Freq: Four times a day (QID) | INTRAMUSCULAR | Status: DC | PRN

## 2016-08-17 MED ORDER — GABAPENTIN 400 MG PO CAPS
400.0000 mg | ORAL_CAPSULE | Freq: Four times a day (QID) | ORAL | Status: DC
  Administered 2016-08-17 – 2016-08-18 (×6): 400 mg via ORAL
  Filled 2016-08-17 (×6): qty 1

## 2016-08-17 MED ORDER — HYDROCODONE-ACETAMINOPHEN 5-325 MG PO TABS
1.0000 | ORAL_TABLET | Freq: Four times a day (QID) | ORAL | Status: DC | PRN
  Administered 2016-08-17: 1 via ORAL
  Filled 2016-08-17: qty 1

## 2016-08-17 MED ORDER — ACETAMINOPHEN 325 MG PO TABS
650.0000 mg | ORAL_TABLET | Freq: Four times a day (QID) | ORAL | Status: DC | PRN
  Administered 2016-08-17 – 2016-08-18 (×3): 650 mg via ORAL
  Filled 2016-08-17 (×3): qty 2

## 2016-08-17 MED ORDER — MIRTAZAPINE 30 MG PO TABS
30.0000 mg | ORAL_TABLET | Freq: Every day | ORAL | Status: DC
  Administered 2016-08-17 (×2): 30 mg via ORAL
  Filled 2016-08-17 (×2): qty 1

## 2016-08-17 MED ORDER — ENOXAPARIN SODIUM 40 MG/0.4ML ~~LOC~~ SOLN
40.0000 mg | SUBCUTANEOUS | Status: DC
  Administered 2016-08-17 – 2016-08-18 (×2): 40 mg via SUBCUTANEOUS
  Filled 2016-08-17 (×2): qty 0.4

## 2016-08-17 MED ORDER — QUETIAPINE FUMARATE 100 MG PO TABS
100.0000 mg | ORAL_TABLET | Freq: Every day | ORAL | Status: DC
  Administered 2016-08-17 (×2): 100 mg via ORAL
  Filled 2016-08-17 (×2): qty 1

## 2016-08-17 MED ORDER — ESCITALOPRAM OXALATE 10 MG PO TABS
20.0000 mg | ORAL_TABLET | Freq: Every day | ORAL | Status: DC
  Administered 2016-08-17 – 2016-08-18 (×2): 20 mg via ORAL
  Filled 2016-08-17: qty 2
  Filled 2016-08-17: qty 1
  Filled 2016-08-17: qty 2

## 2016-08-17 NOTE — Progress Notes (Signed)
Inpatient Diabetes Program Recommendations  AACE/ADA: New Consensus Statement on Inpatient Glycemic Control (2015)  Target Ranges:  Prepandial:   less than 140 mg/dL      Peak postprandial:   less than 180 mg/dL (1-2 hours)      Critically ill patients:  140 - 180 mg/dL   Review of Glycemic Control  Diabetes history: DM 2 Outpatient Diabetes medications: Metformin 500 mg BID Current orders for Inpatient glycemic control: Novolog Sensitive 0-9 units tid  Inpatient Diabetes Program Recommendations:   Glucose in the 300's. No changes in glucose level after Novolog Correction given. Current A1c in process. Consider adding low dose basal insulin while inpatient, Lantus 15 units (0.15 units/kg).  Thanks,  Tama Headings RN, MSN, [facility] Inpatient Diabetes Coordinator Team Pager [phone] (8a-5p)

## 2016-08-17 NOTE — Consult Note (Signed)
Cardiology Consultation:   Patient ID: [name]; [id number]; 11-Aug-1965   Admit date: 08/16/2016 Date of Consult: 08/17/2016  Primary Care Provider: Patient, No Pcp Per Primary Cardiologist: New    Patient Profile:   Ryland Tungate is a 51 y.o. male with a hx of  Type II diabetes,polysubstance abuse, medical non-compliance who is being seen today for the evaluation of  PSVT and dizziness at the request of Dr.Ortiz, Hospitalist Service.   History of Present Illness:   Mr. Dube presented to the ER with complaints of dizziness and palpitations. On presentation EKG revealed SVT rate of 196 bpm. BP was 130/91 O2 Sat 94%, afebrile. He was treated with Adenosine 6 mg along with IV fluids, and ativan. UDS was positive for cocaine and ETOH.  Other pertinent labs included NA of 134, glucose of 382. Creatinine of 1.10. LFTS WNL, WBC elevated at 14.9. CXR did not reveal active disease. Repeat EKG demonstrated sinus tachycardia. No acute EKG changes.    He reports that he was drinking during the day, and working on a car. After he finished working on the car he got up to walk into his home and had sudden onset of dizziness and heart palpitations. He states he fell to his knees. Discussed some significant amount of pain. He was having trouble breathing. He was able to get into his home and call EMS.  He admits to using cocaine the night before admission, and states he is been using most of his adult life. He also has been drinking on and off throughout his adult life as well. Some binging sometimes not drinking at all. He had not taken his medications at morning as well. The patient does not work is on disability for chronic knee pain with bilateral knee replacements and right hip replacement.  Past Medical History:  Diagnosis Date  . Anxiety   . Arthritis    "everywhere" (04/21/2012)  . Cellulitis and abscess of foot 12/19/2013   LEFT FOOT  . Chronic pain   . DDD (degenerative disc disease)   .  Depression   . Diabetes mellitus without complication (HCC)    borderline  . Diabetic foot ulcer ([place])   . ETOH abuse   . GERD (gastroesophageal reflux disease)    tums  . History of blood transfusion    "related to left knee OR; probably right hip too" (04/21/2012)  . Mental disorder   . Neuromuscular disorder (HCC)    neuropathy  . Noncompliance   . Open wound    bottom of foot  . Pneumonia ~ 2012  . Polysubstance abuse    etoh, cocaine, heroin  . Stroke [facility]) 2008   "they said I might have had one during right hip replacement" (04/21/2012)    Past Surgical History:  Procedure Laterality Date  . JOINT REPLACEMENT    . KNEE ARTHROSCOPY Bilateral 1980's/1990's  . LUNG LOBECTOMY Left ~ 2006  . LUNG LOBECTOMY    . METATARSAL OSTEOTOMY  10/29/2011   Procedure: METATARSAL OSTEOTOMY;  Surgeon: [name], MD;  Location: [place];  Service: Orthopedics;  Laterality: Left;  Left 1st Metatarsal Dorsal Closing Wedge   . REVISION TOTAL HIP ARTHROPLASTY Right 2008   "4-5 months after replacement" (04/21/2012)  . TOTAL HIP ARTHROPLASTY Right 2008  . TOTAL KNEE ARTHROPLASTY Left 2006  . TOTAL KNEE ARTHROPLASTY Right 04/20/2012  . TOTAL KNEE ARTHROPLASTY Right 04/20/2012   Procedure: TOTAL KNEE ARTHROPLASTY;  Surgeon: [name], MD;  Location: [place];  Service: Orthopedics;  Laterality: Right;  Right Total Knee Arthroplasty     Inpatient Medications: Scheduled Meds: . enoxaparin (LOVENOX) injection  40 mg Subcutaneous Q24H  . escitalopram  20 mg Oral Daily  . gabapentin  400 mg Oral QID  . insulin aspart  0-9 Units Subcutaneous TID WC  . LORazepam  0-4 mg Intravenous Q6H   Or  . LORazepam  0-4 mg Oral Q6H  . [START ON 08/19/2016] LORazepam  0-4 mg Intravenous Q12H   Or  . [START ON 08/19/2016] LORazepam  0-4 mg Oral Q12H  . mirtazapine  30 mg Oral QHS  . QUEtiapine  100 mg Oral QHS  . thiamine  100 mg Oral Daily   Or  . thiamine  100 mg Intravenous Daily   Continuous Infusions: .  sodium chloride 75 mL/hr at 08/17/16 0239   PRN Meds: HYDROcodone-acetaminophen, ondansetron **OR** ondansetron (ZOFRAN) IV  Allergies:    Allergies  Allergen Reactions  . Benadryl [Diphenhydramine Hcl] Other (See Comments)    Reaction:  Leg spasms   . Trazodone And Nefazodone Other (See Comments)    Reaction:  Leg spasms     Social History:   Social History   Social History  . Marital status: Divorced    Spouse name: N/A  . Number of children: N/A  . Years of education: N/A   Occupational History  . Not on file.   Social History Main Topics  . Smoking status: Current Every Day Smoker    Packs/day: 1.00    Years: 30.00    Types: Cigarettes  . Smokeless tobacco: Never Used     Comment: 04/21/2012 offered smoking cessation materials; pt declines  12/19/2013 DECLINES SMOKING CESSATION MATERIALS  . Alcohol use 0.0 oz/week     Comment: occasionally  . Drug use: Yes    Types: Cocaine     Comment: Cocaine and heroin last used 08/29/14  . Sexual activity: Yes    Birth control/ protection: None   Other Topics Concern  . Not on file   Social History Narrative  . No narrative on file    Family History:   The patient's family history includes Diabetes in his father and mother.  ROS:  Please see the history of present illness.  ROS  All other ROS reviewed and negative.     Physical Exam/Data:   Vitals:   08/16/16 2351 08/17/16 0108 08/17/16 0222 08/17/16 0507  BP: (!) 158/84 135/86 (!) 142/88 132/78  Pulse: 97 94 98 94  Resp: 20 14 18 18   Temp:   98 F (36.7 C) 98.2 F (36.8 C)  TempSrc:   Oral Oral  SpO2: 94% 96% 95% 94%  Weight:   211 lb (95.7 kg)   Height:   5\' 8"  (1.727 m)     Intake/Output Summary (Last 24 hours) at 08/17/16 0836 Last data filed at 08/17/16 0534  Gross per 24 hour  Intake           458.75 ml  Output              700 ml  Net          -241.25 ml   Filed Weights   08/17/16 0222  Weight: 211 lb (95.7 kg)   Body mass index is 32.08  kg/m.  General:  Well nourished, well developed, in no acute distress HEENT: normal Lymph: no adenopathy Neck: no JVD Endocrine:  No thryomegaly Vascular: No carotid bruits; FA pulses 2+ bilaterally without bruits  Cardiac:  normal S1, S2; RRR; Tachycardic, no murmur  Lungs:  Bilateral crackles, no wheezes no coughing.  Abd: soft, nontender, no hepatomegaly hyperactive bowel sounds. Ext: no edema Musculoskeletal:  No deformities, BUE and BLE strength normal and equal Skin: warm and dry  Neuro:  CNs 2-12 intact, no focal abnormalities noted Psych:  Normal affect   EKG:  The EKG was personally reviewed and demonstrates:  Initial EKG revealed SVT heart rate 194 bpm Telemetry:  Telemetry was personally reviewed and demonstrates:  Sinus tachycardia rates in the 80s and 90s  Relevant CV Studies: Echo ordered.   Laboratory Data:  Chemistry Recent Labs Lab 08/16/16 1925 08/17/16 0535  NA 134* 136  K 4.0 3.6  CL 99* 103  CO2 17* 24  GLUCOSE 382* 326*  BUN 15 16  CREATININE 1.10 0.87  CALCIUM 9.5 8.3*  GFRNONAA >60 >60  GFRAA >60 >60  ANIONGAP 18* 9     Recent Labs Lab 08/16/16 1925 08/17/16 0535  PROT 7.8 6.2*  ALBUMIN 4.3 3.5  AST 30 23  ALT 35 27  ALKPHOS 103 84  BILITOT 0.7 0.6   Hematology Recent Labs Lab 08/16/16 1925 08/17/16 0535  WBC 14.9* 7.4  RBC 5.64 5.13  HGB 16.7 14.6  HCT 48.3 44.5  MCV 85.6 86.7  MCH 29.6 28.5  MCHC 34.6 32.8  RDW 16.2* 16.6*  PLT 145* 120*   Cardiac Enzymes Recent Labs Lab 08/16/16 1925 08/17/16 0535  TROPONINI <0.03 <0.03   No results for input(s): TROPIPOC in the last 168 hours.  BNPNo results for input(s): BNP, PROBNP in the last 168 hours.  DDimer No results for input(s): DDIMER in the last 168 hours.  Radiology/Studies:  Dg Chest 1 View  Result Date: 08/16/2016 CLINICAL DATA:  Syncopal episode. EXAM: CHEST 1 VIEW COMPARISON:  02/15/2015 FINDINGS: Electronic apparatus and shock pads overlie the thorax.  Cardiomediastinal silhouette is normal. Mediastinal contours appear intact. There is no evidence of focal airspace consolidation, pleural effusion or pneumothorax. Low lung volumes. Chronic lingular scarring. Osseous structures are without acute abnormality. Soft tissues are grossly normal. IMPRESSION: No active disease. Electronically Signed   By: Fidela Salisbury M.D.   On: 08/16/2016 21:36   Ct Head Wo Contrast  Result Date: 08/16/2016 CLINICAL DATA:  Status post fall.  Dizziness, nausea palpitations. EXAM: CT HEAD WITHOUT CONTRAST TECHNIQUE: Contiguous axial images were obtained from the base of the skull through the vertex without intravenous contrast. COMPARISON:  11/03/2015 FINDINGS: Brain: No evidence of acute infarction, hemorrhage, hydrocephalus, extra-axial collection or mass lesion/mass effect. Moderate brain parenchymal volume loss and microangiopathy. Vascular: No hyperdense vessel or unexpected calcification. Skull: Normal. Negative for fracture or focal lesion. Sinuses/Orbits: Mucous retention cyst in the right maxillary sinus. There remainder of the paranasal sinuses and mastoid air cells are normally aerated. Other: None. IMPRESSION: No acute intracranial abnormality. Moderate brain parenchymal atrophy and chronic microvascular disease. Electronically Signed   By: Fidela Salisbury M.D.   On: 08/16/2016 20:59   Dg Knee Complete 4 Views Left  Result Date: 08/16/2016 CLINICAL DATA:  Left knee pain after fall. EXAM: LEFT KNEE - COMPLETE 4+ VIEW COMPARISON:  Radiographs of July 09, 2016. FINDINGS: Status post left knee arthroplasty. The femoral and tibial components appear to be well situated. No fracture or dislocation is noted. No joint effusion is noted. IMPRESSION: Status post left knee arthroplasty.  No acute abnormality is seen. Electronically Signed   By: Marijo Conception, M.D.   On: 08/16/2016  21:42   Dg Knee Complete 4 Views Right  Result Date: 08/16/2016 CLINICAL DATA:  Right  knee pain after fall. EXAM: RIGHT KNEE - COMPLETE 4+ VIEW COMPARISON:  Radiographs of July 21, 2016. FINDINGS: Status post right total knee arthroplasty. The femoral and tibial prostheses appear to be well situated. No fracture or dislocation is noted. No joint effusion is noted. No soft tissue abnormalities noted. IMPRESSION: Status post right total knee arthroplasty. No acute abnormality is noted. Electronically Signed   By: Marijo Conception, M.D.   On: 08/16/2016 21:37   Dg Hip Unilat With Pelvis 2-3 Views Right  Result Date: 08/16/2016 CLINICAL DATA:  Right hip pain after fall today. EXAM: DG HIP (WITH OR WITHOUT PELVIS) 2-3V RIGHT COMPARISON:  Radiographs of July 09, 2016. FINDINGS: Status post right hip arthroplasty. The femoral and acetabular components appear to be well situated. No fracture or dislocation is noted. IMPRESSION: Status post right hip arthroplasty. No acute abnormality seen in the right hip. Electronically Signed   By: Marijo Conception, M.D.   On: 08/16/2016 21:40    Assessment and Plan:   1. SVT: Multifactorial in the setting of recent cocaine use, possible dehydration, pain with falling. Heart rate remains elevated in the 90s. Blood pressure is stable. Atacand for beta blocker with ongoing cocaine use. Consider calcium channel blocker at low dose. Awaiting echocardiogram for evaluation of LV function and possible tachycardic cardiomyopathy. No evidence of CHF on exam.  2. Dizziness: Likely related to rapid heart rhythm. Creatinine 0.87. No significant dehydration noted per labs this a.m., but creatinine was 1.10 on admission. Magnesium 2.0 on admission  3. Polysubstance abuse: Consider rehabilitation for assistance with complete cessation of cocaine and tobacco with alcohol. This can be discussed by PCP, or social worker close to discharge. He is currently under DT prophylaxis protocol.  4. Diabetes: Not well controlled. Now on SS insulin. This along with tobacco, cocaine and  unknown cholesterol status are risk factors for CAD. May need further OP testing. Check lipids   Signed, Jory Sims DNP, ANP-C. AACC 08/17/2016 8:36 AM   Attending note Patient seen and discussed with DNP Purcell Nails, I agree with her documentation above. 51 yo male history of DM2, polysubstance abuse, admitted with palpitations and dizziness. Found to be in SVT in ER, converted with adenosine.    EtOH 32, K 4, Cr 1.1, WBC 14.9, Hgb 16.7, Mg 2, + cocaine Trop neg CT head moderate atrophy CXR no acute process Echo pending EKG narrow complex regular tach 190  Admitted with SVT in setting of EtoH and cocaine use. We will start diltlaizem 30mg  bid, can take additional 30mg  as needed for symptomatic palpitatinons. We will check TSH as well. F/u echo. No further cardiac plans at this time, refraining from [facility] and cocaine likely will resolve his arrhythmia issues. He was advised to stop using EtOH and cocaine.   Carlyle Dolly MD

## 2016-08-17 NOTE — H&P (Addendum)
TRH H&P    Patient Demographics:    [name], is a 51 y.o. male  MRN: [id number]  DOB - [birth year]  Admit Date - 08/16/2016  Referring MD/NP/PA: Dr. Thurnell Garbe  Outpatient Primary MD for the patient is Patient, No Pcp Per  Patient coming from: Home  Chief Complaint  Patient presents with  . Fall  . Loss of Consciousness      HPI:    [name]  is a 51 y.o. male, With history of diabetes mellitus came to hospital after patient had several months of dizziness and fall while working outside his house. Patient says that he was working under the car all day and felt dehydrated when he came out underneath the car and was walking towards his house he felt lightheaded and dizzy had palpitation and then fell on the ground on his knees.  Patient then called towards his house and called his parents brought him to the hospital. In the ED patient was found to be in SVT with heart rates in the 190's. He was given 1 dose of IV adenosine 6 mg, and converted back to normal sinus rhythm.  He denies chest pain, denies passing out. No nausea vomiting or diarrhea. No shortness of breath. He complains of right knee pain. X-rays of the knee and hips are unremarkable. CT head showed no acute abnormality. Patient drinks alcohol, he drank 3 beers today. Alcohol level 32.   Review of systems:      All other systems reviewed and are negative.   With Past History of the following :    Past Medical History:  Diagnosis Date  . Anxiety   . Arthritis    "everywhere" (04/21/2012)  . Cellulitis and abscess of foot 12/19/2013   LEFT FOOT  . Chronic pain   . DDD (degenerative disc disease)   . Depression   . Diabetes mellitus without complication (HCC)    borderline  . Diabetic foot ulcer ([place])   . ETOH abuse   . GERD (gastroesophageal reflux disease)    tums  . History of blood transfusion    "related to left knee OR;  probably right hip too" (04/21/2012)  . Mental disorder   . Neuromuscular disorder (HCC)    neuropathy  . Noncompliance   . Open wound    bottom of foot  . Pneumonia ~ 2012  . Polysubstance abuse    etoh, cocaine, heroin  . Stroke [facility]) 2008   "they said I might have had one during right hip replacement" (04/21/2012)      Past Surgical History:  Procedure Laterality Date  . JOINT REPLACEMENT    . KNEE ARTHROSCOPY Bilateral 1980's/1990's  . LUNG LOBECTOMY Left ~ 2006  . LUNG LOBECTOMY    . METATARSAL OSTEOTOMY  10/29/2011   Procedure: METATARSAL OSTEOTOMY;  Surgeon: [name], MD;  Location: [facility];  Service: Orthopedics;  Laterality: Left;  Left 1st Metatarsal Dorsal Closing Wedge   . REVISION TOTAL HIP ARTHROPLASTY Right 2008   "4-5 months after replacement" (04/21/2012)  .  TOTAL HIP ARTHROPLASTY Right 2008  . TOTAL KNEE ARTHROPLASTY Left 2006  . TOTAL KNEE ARTHROPLASTY Right 04/20/2012  . TOTAL KNEE ARTHROPLASTY Right 04/20/2012   Procedure: TOTAL KNEE ARTHROPLASTY;  Surgeon: [name], MD;  Location: [facility];  Service: Orthopedics;  Laterality: Right;  Right Total Knee Arthroplasty      Social History:      Social History  Substance Use Topics  . Smoking status: Current Every Day Smoker    Packs/day: 1.00    Years: 30.00    Types: Cigarettes  . Smokeless tobacco: Never Used     Comment: 04/21/2012 offered smoking cessation materials; pt declines  12/19/2013 DECLINES SMOKING CESSATION MATERIALS  . Alcohol use 0.0 oz/week     Comment: occasionally       Family History :     Family History  Problem Relation Age of Onset  . Diabetes Mother   . Diabetes Father       Home Medications:   Prior to Admission medications   Medication Sig Start Date End Date Taking? Authorizing Provider  escitalopram (LEXAPRO) 20 MG tablet Take 20 mg by mouth daily.    Yes [provider]  gabapentin (NEURONTIN) 400 MG capsule Take 1 capsule (400 mg total) by mouth 4 (four)  times daily. For substance withdrawal syndrome Patient taking differently: Take 400 mg by mouth 3 (three) times daily. For substance withdrawal syndrome 03/29/13  Yes Lindell Spar I, NP  ibuprofen (ADVIL,MOTRIN) 200 MG tablet Take 400 mg by mouth every 6 (six) hours as needed for headache or moderate pain.   Yes [provider]  metFORMIN (GLUCOPHAGE) 500 MG tablet Take 1 tablet (500 mg total) by mouth 2 (two) times daily with a meal. 01/15/14  Yes Philemon Kingdom, MD  mirtazapine (REMERON) 30 MG tablet Take 30 mg by mouth at bedtime.   Yes [provider]  QUEtiapine (SEROQUEL) 200 MG tablet Take 1 tablet (200 mg total) by mouth at bedtime. For mood control Patient taking differently: Take 100 mg by mouth at bedtime. For mood control 03/29/13  Yes Encarnacion Slates, NP     Allergies:     Allergies  Allergen Reactions  . Benadryl [Diphenhydramine Hcl] Other (See Comments)    Reaction:  Leg spasms   . Trazodone And Nefazodone Other (See Comments)    Reaction:  Leg spasms      Physical Exam:   Vitals  Blood pressure (!) 158/84, pulse 97, temperature 98.6 F (37 C), temperature source Oral, resp. rate 20, SpO2 94 %.  1.  General: Appears in no acute distress  2. Psychiatric:  Intact judgement and  insight, awake alert, oriented x 3.  3. Neurologic: No focal neurological deficits, all cranial nerves intact.Strength 5/5 all 4 extremities, sensation intact all 4 extremities, plantars down going.  4. Eyes :  anicteric sclerae, moist conjunctivae with no lid lag. PERRLA.  5. ENMT:  Oropharynx clear with moist mucous membranes and good dentition  6. Neck:  supple, no cervical lymphadenopathy appriciated, No thyromegaly  7. Respiratory : Normal respiratory effort, good air movement bilaterally,clear to  auscultation bilaterally  8. Cardiovascular : RRR, no gallops, rubs or murmurs, no leg edema  9. Gastrointestinal:  Positive bowel sounds, abdomen soft,  non-tender to palpation,no hepatosplenomegaly, no rigidity or guarding       10. Skin:  No cyanosis, normal texture and turgor, no rash, lesions or ulcers  11.Musculoskeletal:  Good muscle tone,  joints appear normal , no  effusions,  normal range of motion    Data Review:    CBC  Recent Labs Lab 08/16/16 1925  WBC 14.9*  HGB 16.7  HCT 48.3  PLT 145*  MCV 85.6  MCH 29.6  MCHC 34.6  RDW 16.2*  LYMPHSABS 4.1*  MONOABS 1.1*  EOSABS 0.2  BASOSABS 0.1   ------------------------------------------------------------------------------------------------------------------  Chemistries   Recent Labs Lab 08/16/16 1925  NA 134*  K 4.0  CL 99*  CO2 17*  GLUCOSE 382*  BUN 15  CREATININE 1.10  CALCIUM 9.5  MG 2.0  AST 30  ALT 35  ALKPHOS 103  BILITOT 0.7   ------------------------------------------------------------------------------------------------------------------  ------------------------------------------------------------------------------------------------------------------ GFR: CrCl cannot be calculated (Unknown ideal weight.). Liver Function Tests:  Recent Labs Lab 08/16/16 1925  AST 30  ALT 35  ALKPHOS 103  BILITOT 0.7  PROT 7.8  ALBUMIN 4.3   No results for input(s): LIPASE, AMYLASE in the last 168 hours. No results for input(s): AMMONIA in the last 168 hours. Coagulation Profile: No results for input(s): INR, PROTIME in the last 168 hours. Cardiac Enzymes:  Recent Labs Lab 08/16/16 1925  TROPONINI <0.03   BNP (last 3 results) No results for input(s): PROBNP in the last 8760 hours. HbA1C: No results for input(s): HGBA1C in the last 72 hours. CBG: No results for input(s): GLUCAP in the last 168 hours. Lipid Profile: No results for input(s): CHOL, HDL, LDLCALC, TRIG, CHOLHDL, LDLDIRECT in the last 72 hours. Thyroid Function Tests: No results for input(s): TSH, T4TOTAL, FREET4, T3FREE, THYROIDAB in the last 72 hours. Anemia Panel: No  results for input(s): VITAMINB12, FOLATE, FERRITIN, TIBC, IRON, RETICCTPCT in the last 72 hours.  --------------------------------------------------------------------------------------------------------------- Urine analysis:    Component Value Date/Time   COLORURINE YELLOW 02/15/2015 1833   APPEARANCEUR CLEAR 02/15/2015 1833   LABSPEC <1.005 (L) 02/15/2015 1833   PHURINE 6.0 02/15/2015 1833   GLUCOSEU 250 (A) 02/15/2015 1833   HGBUR NEGATIVE 02/15/2015 1833   BILIRUBINUR NEGATIVE 02/15/2015 1833   KETONESUR NEGATIVE 02/15/2015 1833   PROTEINUR NEGATIVE 02/15/2015 1833   UROBILINOGEN 0.2 12/18/2013 1402   NITRITE NEGATIVE 02/15/2015 1833   LEUKOCYTESUR NEGATIVE 02/15/2015 1833      Imaging Results:    Dg Chest 1 View  Result Date: 08/16/2016 CLINICAL DATA:  Syncopal episode. EXAM: CHEST 1 VIEW COMPARISON:  02/15/2015 FINDINGS: Electronic apparatus and shock pads overlie the thorax. Cardiomediastinal silhouette is normal. Mediastinal contours appear intact. There is no evidence of focal airspace consolidation, pleural effusion or pneumothorax. Low lung volumes. Chronic lingular scarring. Osseous structures are without acute abnormality. Soft tissues are grossly normal. IMPRESSION: No active disease. Electronically Signed   By: Fidela Salisbury M.D.   On: 08/16/2016 21:36   Ct Head Wo Contrast  Result Date: 08/16/2016 CLINICAL DATA:  Status post fall.  Dizziness, nausea palpitations. EXAM: CT HEAD WITHOUT CONTRAST TECHNIQUE: Contiguous axial images were obtained from the base of the skull through the vertex without intravenous contrast. COMPARISON:  11/03/2015 FINDINGS: Brain: No evidence of acute infarction, hemorrhage, hydrocephalus, extra-axial collection or mass lesion/mass effect. Moderate brain parenchymal volume loss and microangiopathy. Vascular: No hyperdense vessel or unexpected calcification. Skull: Normal. Negative for fracture or focal lesion. Sinuses/Orbits: Mucous  retention cyst in the right maxillary sinus. There remainder of the paranasal sinuses and mastoid air cells are normally aerated. Other: None. IMPRESSION: No acute intracranial abnormality. Moderate brain parenchymal atrophy and chronic microvascular disease. Electronically Signed   By: Fidela Salisbury M.D.   On: 08/16/2016 20:59  Dg Knee Complete 4 Views Left  Result Date: 08/16/2016 CLINICAL DATA:  Left knee pain after fall. EXAM: LEFT KNEE - COMPLETE 4+ VIEW COMPARISON:  Radiographs of July 09, 2016. FINDINGS: Status post left knee arthroplasty. The femoral and tibial components appear to be well situated. No fracture or dislocation is noted. No joint effusion is noted. IMPRESSION: Status post left knee arthroplasty.  No acute abnormality is seen. Electronically Signed   By: Marijo Conception, M.D.   On: 08/16/2016 21:42   Dg Knee Complete 4 Views Right  Result Date: 08/16/2016 CLINICAL DATA:  Right knee pain after fall. EXAM: RIGHT KNEE - COMPLETE 4+ VIEW COMPARISON:  Radiographs of July 21, 2016. FINDINGS: Status post right total knee arthroplasty. The femoral and tibial prostheses appear to be well situated. No fracture or dislocation is noted. No joint effusion is noted. No soft tissue abnormalities noted. IMPRESSION: Status post right total knee arthroplasty. No acute abnormality is noted. Electronically Signed   By: Marijo Conception, M.D.   On: 08/16/2016 21:37   Dg Hip Unilat With Pelvis 2-3 Views Right  Result Date: 08/16/2016 CLINICAL DATA:  Right hip pain after fall today. EXAM: DG HIP (WITH OR WITHOUT PELVIS) 2-3V RIGHT COMPARISON:  Radiographs of July 09, 2016. FINDINGS: Status post right hip arthroplasty. The femoral and acetabular components appear to be well situated. No fracture or dislocation is noted. IMPRESSION: Status post right hip arthroplasty. No acute abnormality seen in the right hip. Electronically Signed   By: Marijo Conception, M.D.   On: 08/16/2016 21:40    My personal  review of EKG: Initial EKG showed SVT- after adenosine converted back to normal sinus rhythm   Assessment & Plan:    Active Problems:   Alcohol dependence (HCC)   Diabetes mellitus type 2 in nonobese [facility])   SVT (supraventricular tachycardia) ([place])   1. SVT- converted back to normal sinus rhythm after getting adenosine. Will monitor on telemetry. Obtain serial cardiac enzymes, Echocardiogram. Cardiology consult in a.m. 2. Diabetes mellitus-hold metformin, start sliding scale insulin with NovoLog. 3. Alcohol dependence-started on CIWA protocol 4. Knee pain-status post injury, x-ray knee shows no acute abnormality. Start Vicodin 1 tablet every 6 hours when necessary for pain    DVT Prophylaxis-   Lovenox  AM Labs Ordered, also please review Full Orders  Family Communication: Admission, patients condition and plan of care including tests being ordered have been discussed with the patient  who indicate understanding and agree with the plan and Code Status.  Code Status:  Full code  Admission status: Observation    Time spent in minutes : 60 minutes   LAMA,GAGAN S M.D on 08/17/2016 at 12:20 AM  Between 7am to 7pm - Pager - [phone]. After 7pm go to [email or web address] - password [facility]  [facility] - Office  [phone]

## 2016-08-17 NOTE — Care Management Obs Status (Signed)
[place] NOTIFICATION   Patient Details  Name: Athens Lebeau MRN: [id number] Date of Birth: 1965/03/12   Medicare Observation Status Notification Given:  Yes    Sherald Barge, RN 08/17/2016, 11:09 AM

## 2016-08-17 NOTE — Progress Notes (Signed)
[facility] PROGRESS NOTE    Progress Note  [name]  STM:[id number] DOB: Feb 12, 1965 DOA: 08/16/2016 PCP: Patient, No Pcp Per     Brief Narrative:   [name] is an 51 y.o. male past medical history of diabetes mellitus into the hospital after several months of dizziness and falling while working at home. He relates dizziness upon standing when he was walking towards the house with palpitations and fell to the ground.  Assessment/Plan:   SVT (supraventricular tachycardia) ([place]): Low back to sinus rhythm after one dose of adenosine. Cardiology has been consulted. Check orthostatic vitals. Cardiac biomarkers are negative, 12-lead EKG shows sinus tachycardia. UDS was positive for cocaine. Continue IV fluid hydration.  Alcohol dependence ([place]): Continue to monitor with CIWA protocol.  Diabetes mellitus type 2 in nonobese [facility]) Continue hold metformin, continue sliding scale insulin.   DVT prophylaxis: lovenox Family Communication:none Disposition Plan/Barrier to D/C: hopefully in the am Code Status:     Code Status Orders        Start     Ordered   08/17/16 0156  Full code  Continuous     08/17/16 0155    Code Status History    Date Active Date Inactive Code Status Order ID Comments User Context   10/20/2014  7:13 PM 10/27/2014  4:06 PM Full Code [id number]  Orvan Falconer, MD Inpatient   12/18/2013  2:21 PM 12/22/2013  5:59 PM Full Code [id number]  Kelvin Cellar, MD Inpatient   07/18/2013 10:16 PM 07/20/2013  3:41 PM Full Code [id number]  Phillips Grout, MD Inpatient   03/24/2013 10:40 PM 03/29/2013  5:07 PM Full Code [id number]  Lurena Nida, NP Inpatient   03/24/2013  4:55 PM 03/24/2013 10:40 PM Full Code [id number]  Francine Graven, DO ED   12/02/2012  1:27 PM 12/02/2012 10:56 PM Full Code [id number]  Ezequiel Essex, MD ED   03/28/2012 12:05 AM 03/28/2012  5:11 PM Full Code [id number]  Teressa Lower, MD ED   03/25/2012  6:36 AM 03/25/2012  6:59 PM Full Code [id number]  Prentiss Bells, MD ED   03/24/2012  4:49 AM 03/25/2012  6:36 AM Full Code [id number]  Prentiss Bells, MD ED   02/08/2012  8:40 PM 02/09/2012  9:20 PM Full Code [id number]  Virgel Manifold, MD ED   02/12/2011  5:05 PM 02/15/2011  2:42 PM Full Code [id number]  Leana Gamer, MD ED   02/08/2011  1:37 AM 02/08/2011  2:22 PM Full Code [id number]  Threasa Beards, MD ED        IV Access:    Peripheral IV   Procedures and diagnostic studies:   Dg Chest 1 View  Result Date: 08/16/2016 CLINICAL DATA:  Syncopal episode. EXAM: CHEST 1 VIEW COMPARISON:  02/15/2015 FINDINGS: Electronic apparatus and shock pads overlie the thorax. Cardiomediastinal silhouette is normal. Mediastinal contours appear intact. There is no evidence of focal airspace consolidation, pleural effusion or pneumothorax. Low lung volumes. Chronic lingular scarring. Osseous structures are without acute abnormality. Soft tissues are grossly normal. IMPRESSION: No active disease. Electronically Signed   By: Fidela Salisbury M.D.   On: 08/16/2016 21:36   Ct Head Wo Contrast  Result Date: 08/16/2016 CLINICAL DATA:  Status post fall.  Dizziness, nausea palpitations. EXAM: CT HEAD WITHOUT CONTRAST TECHNIQUE: Contiguous axial images were obtained from the base of the skull through the vertex without intravenous contrast. COMPARISON:  11/03/2015 FINDINGS: Brain: No evidence of acute infarction, hemorrhage, hydrocephalus, extra-axial collection or mass  lesion/mass effect. Moderate brain parenchymal volume loss and microangiopathy. Vascular: No hyperdense vessel or unexpected calcification. Skull: Normal. Negative for fracture or focal lesion. Sinuses/Orbits: Mucous retention cyst in the right maxillary sinus. There remainder of the paranasal sinuses and mastoid air cells are normally aerated. Other: None. IMPRESSION: No acute intracranial abnormality. Moderate brain parenchymal atrophy and chronic microvascular disease. Electronically Signed   By: Fidela Salisbury M.D.   On: 08/16/2016 20:59   Dg Knee Complete 4 Views Left  Result Date: 08/16/2016 CLINICAL DATA:  Left knee pain after fall. EXAM: LEFT KNEE - COMPLETE 4+ VIEW COMPARISON:  Radiographs of July 09, 2016. FINDINGS: Status post left knee arthroplasty. The femoral and tibial components appear to be well situated. No fracture or dislocation is noted. No joint effusion is noted. IMPRESSION: Status post left knee arthroplasty.  No acute abnormality is seen. Electronically Signed   By: Marijo Conception, M.D.   On: 08/16/2016 21:42   Dg Knee Complete 4 Views Right  Result Date: 08/16/2016 CLINICAL DATA:  Right knee pain after fall. EXAM: RIGHT KNEE - COMPLETE 4+ VIEW COMPARISON:  Radiographs of July 21, 2016. FINDINGS: Status post right total knee arthroplasty. The femoral and tibial prostheses appear to be well situated. No fracture or dislocation is noted. No joint effusion is noted. No soft tissue abnormalities noted. IMPRESSION: Status post right total knee arthroplasty. No acute abnormality is noted. Electronically Signed   By: Marijo Conception, M.D.   On: 08/16/2016 21:37   Dg Hip Unilat With Pelvis 2-3 Views Right  Result Date: 08/16/2016 CLINICAL DATA:  Right hip pain after fall today. EXAM: DG HIP (WITH OR WITHOUT PELVIS) 2-3V RIGHT COMPARISON:  Radiographs of July 09, 2016. FINDINGS: Status post right hip arthroplasty. The femoral and acetabular components appear to be well situated. No fracture or dislocation is noted. IMPRESSION: Status post right hip arthroplasty. No acute abnormality seen in the right hip. Electronically Signed   By: Marijo Conception, M.D.   On: 08/16/2016 21:40     Medical Consultants:    None.  Anti-Infectives:   None  Subjective:    [name] he relates no new complaints.  Objective:    Vitals:   08/16/16 2351 08/17/16 0108 08/17/16 0222 08/17/16 0507  BP: (!) 158/84 135/86 (!) 142/88 132/78  Pulse: 97 94 98 94  Resp: 20 14 18 18   Temp:   98  F (36.7 C) 98.2 F (36.8 C)  TempSrc:   Oral Oral  SpO2: 94% 96% 95% 94%  Weight:   95.7 kg (211 lb)   Height:   5\' 8"  (1.727 m)     Intake/Output Summary (Last 24 hours) at 08/17/16 0913 Last data filed at 08/17/16 0534  Gross per 24 hour  Intake           458.75 ml  Output              700 ml  Net          -241.25 ml   Filed Weights   08/17/16 0222  Weight: 95.7 kg (211 lb)    Exam: General exam: In no acute distress. Respiratory system: Good air movement and clear to auscultation. Cardiovascular system: S1 & S2 heard, RRR. No JVD. Gastrointestinal system: Abdomen is nondistended, soft and nontender.  Central nervous system: Alert and oriented. No focal neurological deficits. Extremities: No pedal edema. Skin: No rashes, lesions or ulcers Psychiatry: Judgement and insight appear normal. Mood & affect  appropriate.    Data Reviewed:    Labs: Basic Metabolic Panel:  Recent Labs Lab 08/16/16 1925 08/17/16 0535  NA 134* 136  K 4.0 3.6  CL 99* 103  CO2 17* 24  GLUCOSE 382* 326*  BUN 15 16  CREATININE 1.10 0.87  CALCIUM 9.5 8.3*  MG 2.0  --    GFR Estimated Creatinine Clearance: 110.1 mL/min (by C-G formula based on SCr of 0.87 mg/dL). Liver Function Tests:  Recent Labs Lab 08/16/16 1925 08/17/16 0535  AST 30 23  ALT 35 27  ALKPHOS 103 84  BILITOT 0.7 0.6  PROT 7.8 6.2*  ALBUMIN 4.3 3.5   No results for input(s): LIPASE, AMYLASE in the last 168 hours. No results for input(s): AMMONIA in the last 168 hours. Coagulation profile No results for input(s): INR, PROTIME in the last 168 hours.  CBC:  Recent Labs Lab 08/16/16 1925 08/17/16 0535  WBC 14.9* 7.4  NEUTROABS 9.5*  --   HGB 16.7 14.6  HCT 48.3 44.5  MCV 85.6 86.7  PLT 145* 120*   Cardiac Enzymes:  Recent Labs Lab 08/16/16 1925 08/17/16 0535  TROPONINI <0.03 <0.03   BNP (last 3 results) No results for input(s): PROBNP in the last 8760 hours. CBG:  Recent Labs Lab  08/17/16 0241 08/17/16 0727  GLUCAP 375* 327*   D-Dimer: No results for input(s): DDIMER in the last 72 hours. Hgb A1c: No results for input(s): HGBA1C in the last 72 hours. Lipid Profile: No results for input(s): CHOL, HDL, LDLCALC, TRIG, CHOLHDL, LDLDIRECT in the last 72 hours. Thyroid function studies: No results for input(s): TSH, T4TOTAL, T3FREE, THYROIDAB in the last 72 hours.  Invalid input(s): FREET3 Anemia work up: No results for input(s): VITAMINB12, FOLATE, FERRITIN, TIBC, IRON, RETICCTPCT in the last 72 hours. Sepsis Labs:  Recent Labs Lab 08/16/16 1925 08/17/16 0535  WBC 14.9* 7.4   Microbiology No results found for this or any previous visit (from the past 240 hour(s)).   Medications:   . enoxaparin (LOVENOX) injection  40 mg Subcutaneous Q24H  . escitalopram  20 mg Oral Daily  . gabapentin  400 mg Oral QID  . insulin aspart  0-9 Units Subcutaneous TID WC  . LORazepam  0-4 mg Intravenous Q6H   Or  . LORazepam  0-4 mg Oral Q6H  . [START ON 08/19/2016] LORazepam  0-4 mg Intravenous Q12H   Or  . [START ON 08/19/2016] LORazepam  0-4 mg Oral Q12H  . mirtazapine  30 mg Oral QHS  . QUEtiapine  100 mg Oral QHS  . thiamine  100 mg Oral Daily   Or  . thiamine  100 mg Intravenous Daily   Continuous Infusions: . sodium chloride 75 mL/hr at 08/17/16 0239     LOS: 0 days   Charlynne Cousins  [facility] Pager [phone]  *Please refer to [email or web address], password [email or web address] to get updated schedule on who will round on this patient, as hospitalists switch teams weekly. If 7PM-7AM, please contact night-coverage at [email or web address], password [email or web address] for any overnight needs.  08/17/2016, 9:13 AM

## 2016-08-17 NOTE — Progress Notes (Signed)
*  PRELIMINARY RESULTS* Echocardiogram 2D Echocardiogram has been performed.  Samuel Germany 08/17/2016, 9:27 AM

## 2016-08-18 DIAGNOSIS — E119 Type 2 diabetes mellitus without complications: Secondary | ICD-10-CM | POA: Diagnosis not present

## 2016-08-18 DIAGNOSIS — F1010 Alcohol abuse, uncomplicated: Secondary | ICD-10-CM

## 2016-08-18 DIAGNOSIS — S80211A Abrasion, right knee, initial encounter: Secondary | ICD-10-CM

## 2016-08-18 LAB — LIPID PANEL
Cholesterol: 242 mg/dL — ABNORMAL HIGH (ref 0–200)
HDL: 29 mg/dL — ABNORMAL LOW (ref >40)
LDL CALC: UNDETERMINED mg/dL (ref 0–99)
TRIGLYCERIDES: 720 mg/dL — AB (ref <150)
Total CHOL/HDL Ratio: 8.3 RATIO
VLDL: UNDETERMINED mg/dL (ref 0–40)

## 2016-08-18 LAB — TSH: TSH: 1.317 u[IU]/mL (ref 0.350–4.500)

## 2016-08-18 LAB — HEMOGLOBIN A1C
Hgb A1c MFr Bld: 10.3 % — ABNORMAL HIGH (ref 4.8–5.6)
MEAN PLASMA GLUCOSE: 249 mg/dL

## 2016-08-18 LAB — HIV ANTIBODY (ROUTINE TESTING W REFLEX): HIV Screen 4th Generation wRfx: NONREACTIVE

## 2016-08-18 LAB — GLUCOSE, CAPILLARY
GLUCOSE-CAPILLARY: 328 mg/dL — AB (ref 65–99)
GLUCOSE-CAPILLARY: 320 mg/dL — AB (ref 65–99)

## 2016-08-18 MED ORDER — BLOOD GLUCOSE MONITOR KIT: PACK | 0 refills | Status: DC

## 2016-08-18 MED ORDER — LIVING WELL WITH DIABETES BOOK
Freq: Once | Status: DC
  Filled 2016-08-18: qty 1

## 2016-08-18 MED ORDER — THIAMINE HCL 100 MG PO TABS: 100.0000 mg | ORAL_TABLET | Freq: Every day | ORAL | 1 refills | Status: DC

## 2016-08-18 MED ORDER — DILTIAZEM HCL 30 MG PO TABS: 30.0000 mg | ORAL_TABLET | Freq: Two times a day (BID) | ORAL | 1 refills | Status: DC

## 2016-08-18 NOTE — Progress Notes (Addendum)
Inpatient Diabetes Program Recommendations  AACE/ADA: New Consensus Statement on Inpatient Glycemic Control (2015)  Target Ranges:  Prepandial:   less than 140 mg/dL      Peak postprandial:   less than 180 mg/dL (1-2 hours)      Critically ill patients:  140 - 180 mg/dL   Results for [name], [name] (MRN [id number]) as of 08/18/2016 07:46  Ref. Range 08/17/2016 07:27 08/17/2016 11:09 08/17/2016 16:29 08/17/2016 20:56 08/18/2016 07:21  Glucose-Capillary Latest Ref Range: 65 - 99 mg/dL 327 (H) 347 (H) 274 (H) 236 (H) 328 (H)   Review of Glycemic Control  Diabetes history: DM2 Outpatient Diabetes medications: Metformin 500 mg BID Current orders for Inpatient glycemic control: Novolog 0-9 units TID with meals  Inpatient Diabetes Program Recommendations: Insulin - Basal: Please consider ordering Lantus 15 units Q24H starting now. Correction (SSI): Please consider increasing Novolog correction scale to Moderate scale and adding Novolog 0-5 units QHS. HgbA1C: A1C 10.3% on 08/17/16 indicating an average glucose of 249 mg/dl over the past 2-3 months. Anticipate patient will need additional DM medications as an outpatient.   Addendum 08/18/16@14 :45-Spoke with patient about diabetes and home regimen for diabetes control. Patient reports that he is followed by PCP for diabetes management and currently he is prescribed Metformin 500 mg BID as an outpatient for diabetes control. Patient reports that he does not take Metformin consistently because he forgets about taking it.  Patient states that he does not check glucose at home.  Discussed A1C results (10.3% on 08/17/16) and explained that his current A1C indicates an average glucose of 249 mg/dl over the past 2-3 months. Discussed glucose and A1C goals. Discussed importance of checking CBGs and maintaining good CBG control to prevent long-term and short-term complications. Explained how hyperglycemia leads to damage within blood vessels which lead to the common  complications seen with uncontrolled diabetes. Stressed to the patient the importance of improving glycemic control to prevent further complications from uncontrolled diabetes. Discussed impact of nutrition, exercise, stress, sickness, and medications on diabetes control. Patient reports that he will do his best to take DM medications as prescribed.  Encouraged patient to check his glucose at least 2-3 times per day and to keep a log book of glucose readings which he will need to take to doctor appointments. Explained how the doctor he follows up with can use the log book to continue to make adjustments with DM medications if needed. Patient verbalized understanding of information discussed and he states that he has no further questions at this time related to diabetes. Talked with Dr. Marin Comment and provided with prescription for glucometer and testing supplies.  Thanks, Barnie Alderman, RN, MSN, CDE Diabetes Coordinator Inpatient Diabetes Program [phone] (Team Pager from 8am to 5pm)

## 2016-08-18 NOTE — Care Management Note (Signed)
Case Management Note  Patient Details  Name: [name] MRN: [id number] Date of Birth: 12/27/1965  Subjective/Objective:                  Admitted with SVT. Pt from home, lives alone, ind with ADL's. Active and not homebound. Pt uses cane with ambulation, has chronic back pain. Pt with documented substance abuse. No PCP. Has insurance with drug coverage. Pt communicates no needs.   Action/Plan: Discharging RN has made pt f/u appointment with Dr. Cindie Laroche. No CM needs at this time.   Expected Discharge Date:  08/18/16               Expected Discharge Plan:  Home/Self Care  In-House Referral:  NA  Discharge planning Services  CM Consult  Post Acute Care Choice:  NA Choice offered to:  NA  Status of Service:  Completed, signed off  Sherald Barge, RN 08/18/2016, 2:43 PM

## 2016-08-18 NOTE — Discharge Summary (Signed)
Physician Discharge Summary  [name] STM:[id number] DOB: [birth year] DOA: 08/16/2016  PCP: Patient, No Pcp Per  Admit date: 08/16/2016 Discharge date: 08/18/2016  Admitted From: Home.  Disposition:  Home.   Recommendations for Outpatient Follow-up:  1. Follow up with PCP in 1-2 weeks 2. Follow with cardiology as scheduled.   Home Health: None.  Equipment/Devices: none.  Discharge Condition: normal HR.  CODE STATUS: FULL CODE.  Diet recommendation: Cardiac.   Brief/Interim Summary: Patient presented for lightheadeness, found to be in SVT at 190's, and was admitted by Dr Darrick Meigs on August 17, 2016.  As per his H and P:   [name]  is a 51 y.o. male, With history of diabetes mellitus came to hospital after patient had several months of dizziness and fall while working outside his house. Patient says that he was working under the car all day and felt dehydrated when he came out underneath the car and was walking towards his house he felt lightheaded and dizzy had palpitation and then fell on the ground on his knees.  Patient then called towards his house and called his parents brought him to the hospital. In the ED patient was found to be in SVT with heart rates in the 190's. He was given 1 dose of IV adenosine 6 mg, and converted back to normal sinus rhythm.  He denies chest pain, denies passing out. No nausea vomiting or diarrhea. No shortness of breath. He complains of right knee pain. X-rays of the knee and hips are unremarkable. CT head showed no acute abnormality. Patient drinks alcohol, he drank 3 beers today. Alcohol level 32.  HOSPITAL COURSE:  Patient was admitted and placed on telemetry.  He was given Cardizem at 73m BID, and his HR converted to NSR.  He was seen in consultation with cardiology, and Dr BHarl Bowiehas seen him.  He recommended to continue with his meds, and avoid any further substance abuse.  He asked me for narcotics, but I declined.  He then asked me for Xanax, and I  declined.  There is no sign of withdrawal.  He was referred to Dr DCindie Larochefor follow up. Cardiology has made a follow up appointment for him as well.  Glucometer Rx was given to him, as he didn't have one.   Thank you for asking me to take care of him.    Discharge Diagnoses:  Active Problems:   Alcohol dependence (HCC)   Diabetes mellitus type 2 in nonobese ([facility]   SVT (supraventricular tachycardia) ([facility]   Discharge Instructions  Discharge Instructions    Diet - low sodium heart healthy    Complete by:  As directed    Discharge instructions    Complete by:  As directed    Take your medications as directed.  Don't use drug or smoke cigarettes.   Increase activity slowly    Complete by:  As directed      Allergies as of 08/18/2016      Reactions   Benadryl [diphenhydramine Hcl] Other (See Comments)   Reaction:  Leg spasms    Trazodone And Nefazodone Other (See Comments)   Reaction:  Leg spasms       Medication List    STOP taking these medications   ibuprofen 200 MG tablet Commonly known as:  ADVIL,MOTRIN     TAKE these medications   blood glucose meter kit and supplies Kit Dispense based on patient and insurance preference. Use up to four times daily as directed. (FOR ICD-9  250.00, 250.01).   blood glucose meter kit and supplies Kit Dispense based on patient and insurance preference. Use up to four times daily as directed. (FOR ICD-9 250.00, 250.01).   diltiazem 30 MG tablet Commonly known as:  CARDIZEM Take 1 tablet (30 mg total) by mouth 2 (two) times daily.   escitalopram 20 MG tablet Commonly known as:  LEXAPRO Take 20 mg by mouth daily.   gabapentin 400 MG capsule Commonly known as:  NEURONTIN Take 1 capsule (400 mg total) by mouth 4 (four) times daily. For substance withdrawal syndrome What changed:  when to take this  additional instructions   metFORMIN 500 MG tablet Commonly known as:  GLUCOPHAGE Take 1 tablet (500 mg total) by mouth 2 (two)  times daily with a meal.   mirtazapine 30 MG tablet Commonly known as:  REMERON Take 30 mg by mouth at bedtime.   QUEtiapine 200 MG tablet Commonly known as:  SEROQUEL Take 1 tablet (200 mg total) by mouth at bedtime. For mood control What changed:  how much to take  additional instructions   thiamine 100 MG tablet Take 1 tablet (100 mg total) by mouth daily.      Follow-up Information    Lendon Colonel, NP On 09/10/2016.   Specialties:  Nurse Practitioner, Radiology, Cardiology Why:  at 2:00 pm Contact information: [place] [address] [phone]        Lucia Gaskins, MD.   Specialty:  Internal Medicine Contact information: [place] [address] [phone]          Allergies  Allergen Reactions  . Benadryl [Diphenhydramine Hcl] Other (See Comments)    Reaction:  Leg spasms   . Trazodone And Nefazodone Other (See Comments)    Reaction:  Leg spasms     Consultations:  Cardiology   Procedures/Studies: Dg Chest 1 View  Result Date: 08/16/2016 CLINICAL DATA:  Syncopal episode. EXAM: CHEST 1 VIEW COMPARISON:  02/15/2015 FINDINGS: Electronic apparatus and shock pads overlie the thorax. Cardiomediastinal silhouette is normal. Mediastinal contours appear intact. There is no evidence of focal airspace consolidation, pleural effusion or pneumothorax. Low lung volumes. Chronic lingular scarring. Osseous structures are without acute abnormality. Soft tissues are grossly normal. IMPRESSION: No active disease. Electronically Signed   By: Fidela Salisbury M.D.   On: 08/16/2016 21:36   Ct Head Wo Contrast  Result Date: 08/16/2016 CLINICAL DATA:  Status post fall.  Dizziness, nausea palpitations. EXAM: CT HEAD WITHOUT CONTRAST TECHNIQUE: Contiguous axial images were obtained from the base of the skull through the vertex without intravenous contrast. COMPARISON:  11/03/2015 FINDINGS: Brain: No evidence of acute infarction,  hemorrhage, hydrocephalus, extra-axial collection or mass lesion/mass effect. Moderate brain parenchymal volume loss and microangiopathy. Vascular: No hyperdense vessel or unexpected calcification. Skull: Normal. Negative for fracture or focal lesion. Sinuses/Orbits: Mucous retention cyst in the right maxillary sinus. There remainder of the paranasal sinuses and mastoid air cells are normally aerated. Other: None. IMPRESSION: No acute intracranial abnormality. Moderate brain parenchymal atrophy and chronic microvascular disease. Electronically Signed   By: Fidela Salisbury M.D.   On: 08/16/2016 20:59   Dg Knee Complete 4 Views Left  Result Date: 08/16/2016 CLINICAL DATA:  Left knee pain after fall. EXAM: LEFT KNEE - COMPLETE 4+ VIEW COMPARISON:  Radiographs of July 09, 2016. FINDINGS: Status post left knee arthroplasty. The femoral and tibial components appear to be well situated. No fracture or dislocation is noted. No joint effusion is noted.  IMPRESSION: Status post left knee arthroplasty.  No acute abnormality is seen. Electronically Signed   By: Marijo Conception, M.D.   On: 08/16/2016 21:42   Dg Knee Complete 4 Views Right  Result Date: 08/16/2016 CLINICAL DATA:  Right knee pain after fall. EXAM: RIGHT KNEE - COMPLETE 4+ VIEW COMPARISON:  Radiographs of July 21, 2016. FINDINGS: Status post right total knee arthroplasty. The femoral and tibial prostheses appear to be well situated. No fracture or dislocation is noted. No joint effusion is noted. No soft tissue abnormalities noted. IMPRESSION: Status post right total knee arthroplasty. No acute abnormality is noted. Electronically Signed   By: Marijo Conception, M.D.   On: 08/16/2016 21:37   Dg Hip Unilat With Pelvis 2-3 Views Right  Result Date: 08/16/2016 CLINICAL DATA:  Right hip pain after fall today. EXAM: DG HIP (WITH OR WITHOUT PELVIS) 2-3V RIGHT COMPARISON:  Radiographs of July 09, 2016. FINDINGS: Status post right hip arthroplasty. The femoral  and acetabular components appear to be well situated. No fracture or dislocation is noted. IMPRESSION: Status post right hip arthroplasty. No acute abnormality seen in the right hip. Electronically Signed   By: Marijo Conception, M.D.   On: 08/16/2016 21:40       Subjective: No complaints.    Discharge Exam: Vitals:   08/18/16 1100 08/18/16 1413  BP: (!) 133/95 (!) 143/83  Pulse: 82 92  Resp:  18  Temp:  97.8 F (36.6 C)   Vitals:   08/18/16 0454 08/18/16 1100 08/18/16 1129 08/18/16 1413  BP: 128/86 (!) 133/95  (!) 143/83  Pulse: 83 82  92  Resp: 18   18  Temp: (!) 97.4 F (36.3 C)   97.8 F (36.6 C)  TempSrc: Oral   Oral  SpO2: 95%  98% 98%  Weight:      Height:        General: Pt is alert, awake, not in acute distress Cardiovascular: RRR, S1/S2 +, no rubs, no gallops Respiratory: CTA bilaterally, no wheezing, no rhonchi Abdominal: Soft, NT, ND, bowel sounds + Extremities: no edema, no cyanosis    The results of significant diagnostics from this hospitalization (including imaging, microbiology, ancillary and laboratory) are listed below for reference.     Basic Metabolic Panel:  Recent Labs Lab 08/16/16 1925 08/17/16 0535  NA 134* 136  K 4.0 3.6  CL 99* 103  CO2 17* 24  GLUCOSE 382* 326*  BUN 15 16  CREATININE 1.10 0.87  CALCIUM 9.5 8.3*  MG 2.0  --    Liver Function Tests:  Recent Labs Lab 08/16/16 1925 08/17/16 0535  AST 30 23  ALT 35 27  ALKPHOS 103 84  BILITOT 0.7 0.6  PROT 7.8 6.2*  ALBUMIN 4.3 3.5   CBC:  Recent Labs Lab 08/16/16 1925 08/17/16 0535  WBC 14.9* 7.4  NEUTROABS 9.5*  --   HGB 16.7 14.6  HCT 48.3 44.5  MCV 85.6 86.7  PLT 145* 120*   Cardiac Enzymes:  Recent Labs Lab 08/16/16 1925 08/17/16 0535 08/17/16 1141 08/17/16 1758  TROPONINI <0.03 <0.03 <0.03 <0.03   BNP: Invalid input(s): POCBNP CBG:  Recent Labs Lab 08/17/16 1109 08/17/16 1629 08/17/16 2056 08/18/16 0721 08/18/16 1110  GLUCAP 347* 274*  236* 328* 320*   Hgb A1c  Recent Labs  08/17/16 0534  HGBA1C 10.3*   Lipid Profile  Recent Labs  08/18/16 0604  CHOL 242*  HDL 29*  LDLCALC UNABLE TO CALCULATE IF TRIGLYCERIDE OVER  400 mg/dL  TRIG 720*  CHOLHDL 8.3   Thyroid function studies  Recent Labs  08/17/16 1758  TSH 1.317   Urinalysis    Component Value Date/Time   COLORURINE YELLOW 02/15/2015 1833   APPEARANCEUR CLEAR 02/15/2015 1833   LABSPEC <1.005 (L) 02/15/2015 1833   PHURINE 6.0 02/15/2015 1833   GLUCOSEU 250 (A) 02/15/2015 1833   HGBUR NEGATIVE 02/15/2015 1833   BILIRUBINUR NEGATIVE 02/15/2015 1833   KETONESUR NEGATIVE 02/15/2015 1833   PROTEINUR NEGATIVE 02/15/2015 1833   UROBILINOGEN 0.2 12/18/2013 1402   NITRITE NEGATIVE 02/15/2015 1833   LEUKOCYTESUR NEGATIVE 02/15/2015 1833    Time coordinating discharge: Over 30 minutes SIGNED:   Orvan Falconer, MD FACP [facility] 08/18/2016, 7:31 PM   If 7PM-7AM, please contact night-coverage [email or web address] Password [email or web address]

## 2016-08-18 NOTE — Progress Notes (Signed)
Patient presented with SVT in setting of polysubstance abuse, + for EtOH and cocaine. Normal echo yesterday. Started on diltiazem 30mg  bid. No recurrent SVT overnight. Keep K at 4, Mg at 2. TSH pending. Counseled on stopping EtoH and cocaine. No further cardiology recs at this time, we will sign off inpatient care. We will arrange f/u in 3 weeks.   Carlyle Dolly MD

## 2016-09-01 IMAGING — DX DG ANKLE COMPLETE 3+V*L*
3 series · 3 of 3 positions shown · non-contrast
Comparison: 06/30/2013

CLINICAL DATA: Diabetic foot.  Cellulitis

EXAM:
LEFT ANKLE COMPLETE - 3+ VIEW

[ankle ap]
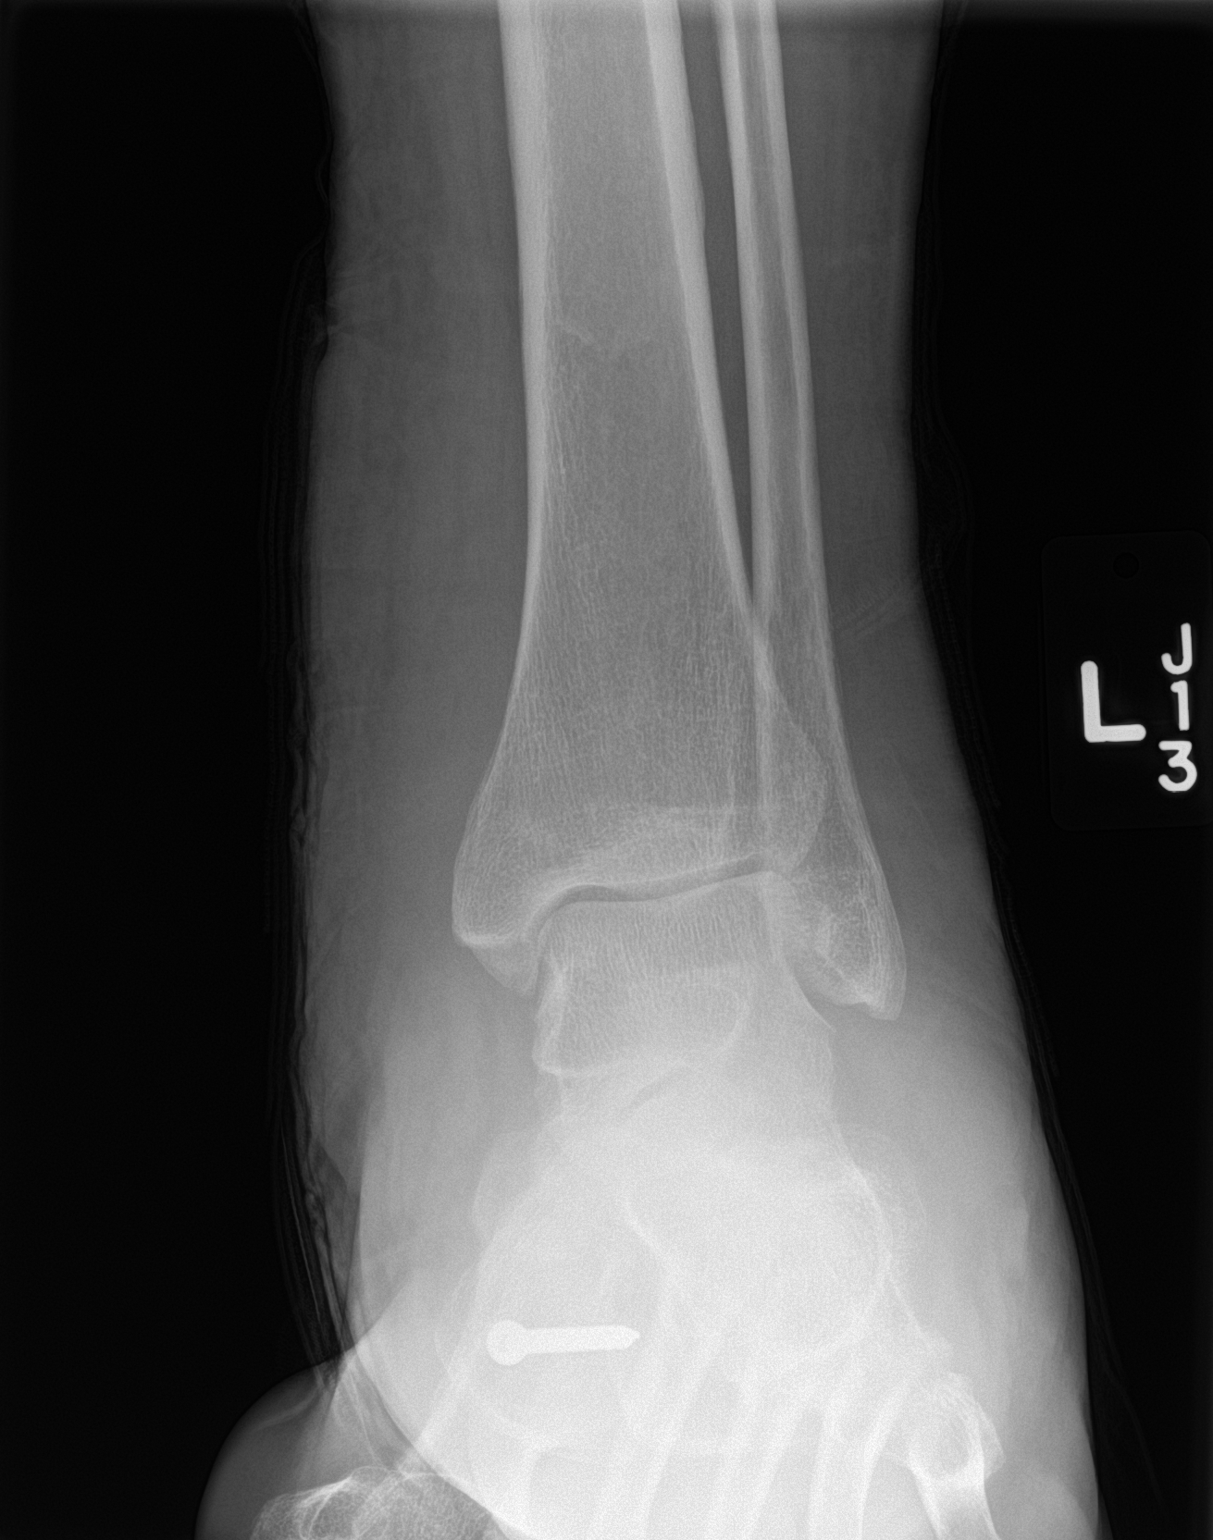

[ankle obl]
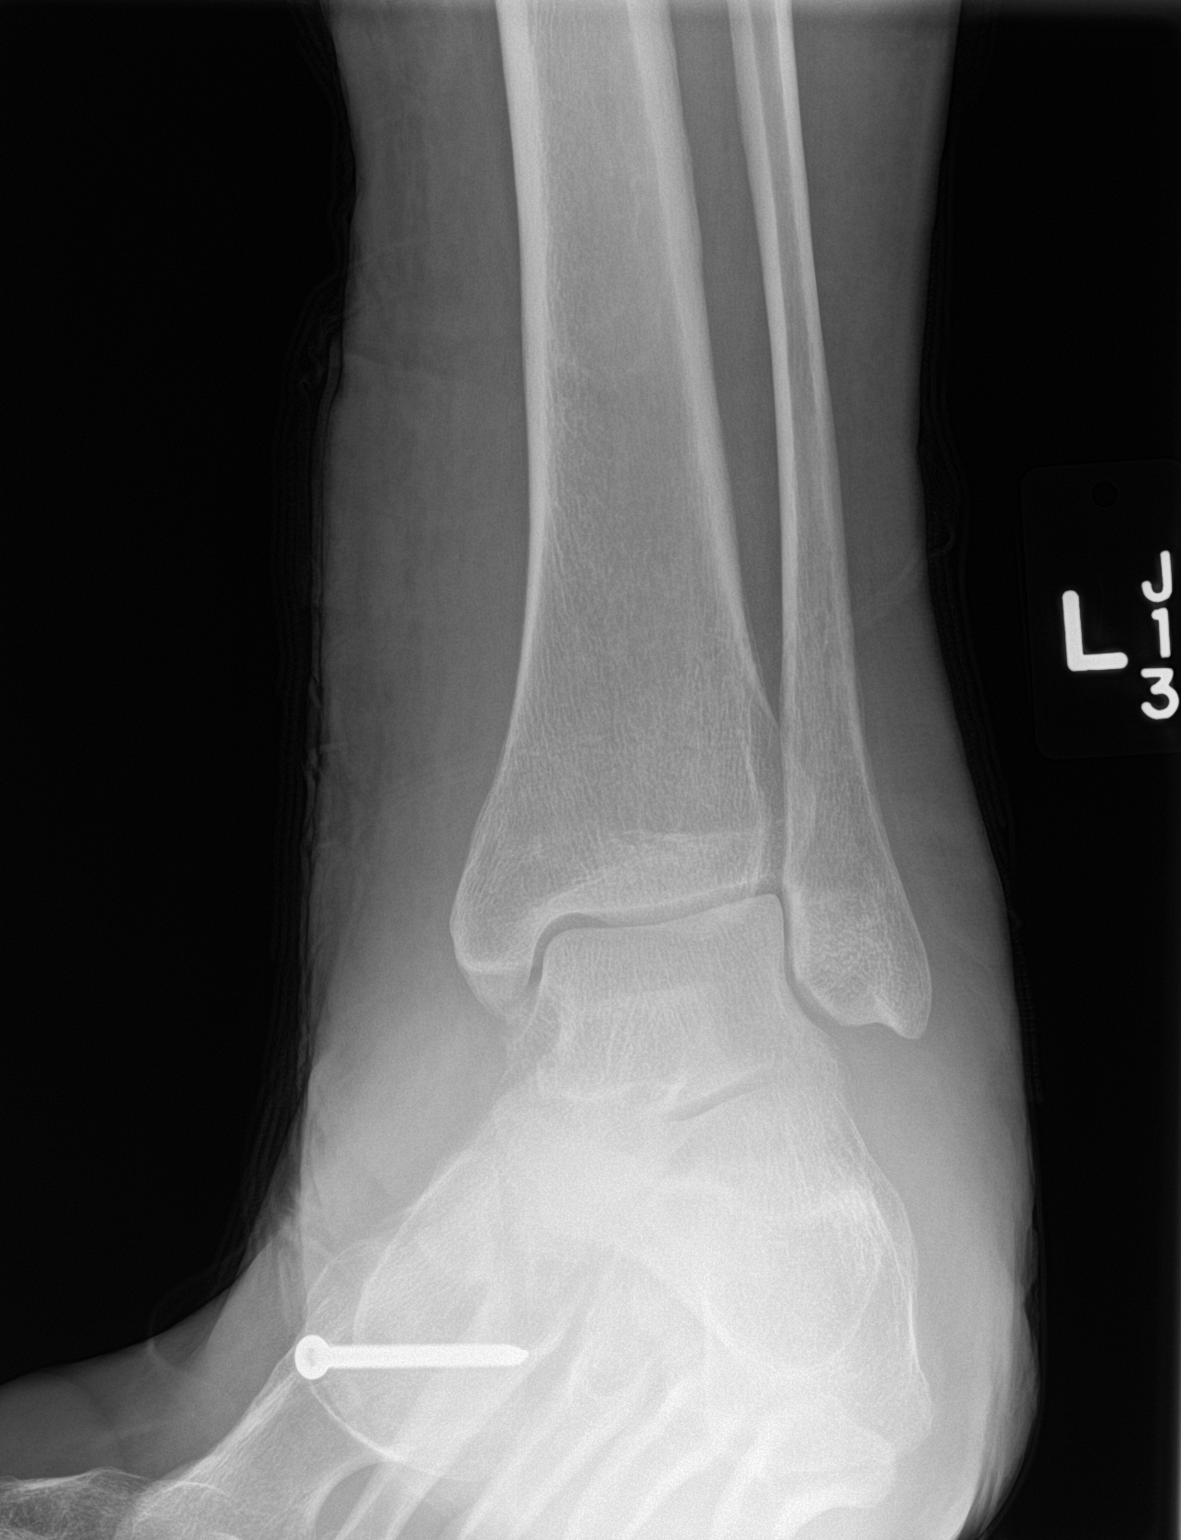

[ankle lat]
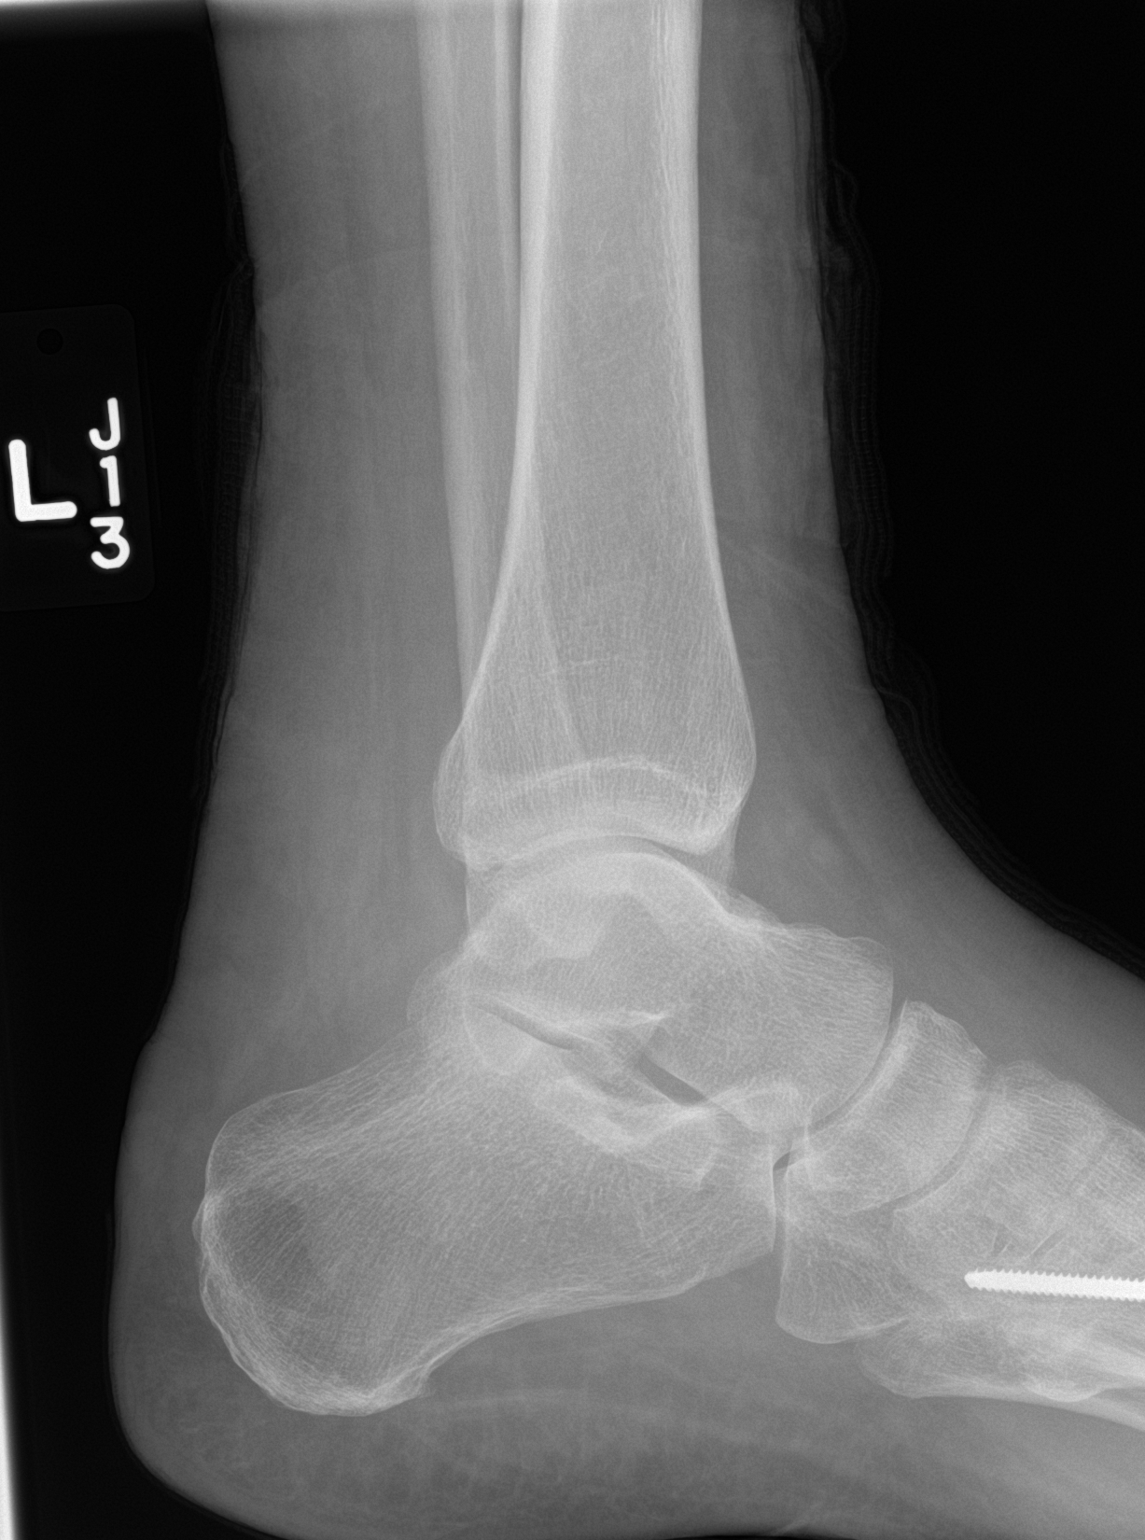

[3 of 3 positions shown; findings below may reference images not displayed]

FINDINGS: Diffuse soft tissue swelling has progressed. Joint effusion is
noted. Joint space is normal.

Negative for fracture or osteomyelitis.

First metatarsal screw again noted.
IMPRESSION: Diffuse soft tissue swelling and joint effusion. Negative for
fracture or osteomyelitis.

## 2016-09-01 IMAGING — MR MR FOOT*L* W/O CM
4 of 7 series · 25 of 40 positions shown · non-contrast
Comparison: None.

CLINICAL DATA: Diabetes. Foot ulcer. Worsening lower extremity pain
and swelling. Cellulitis

EXAM:
MRI OF THE LEFT FOREFOOT WITHOUT CONTRAST
TECHNIQUE: Multiplanar, multisequence MR imaging was performed. No intravenous
contrast was administered.

[Series 4: T1 · coronal · 4.0mm · 0.31mm/px · 8 of 50 slices shown (1 of 3)]
[im 1/50]
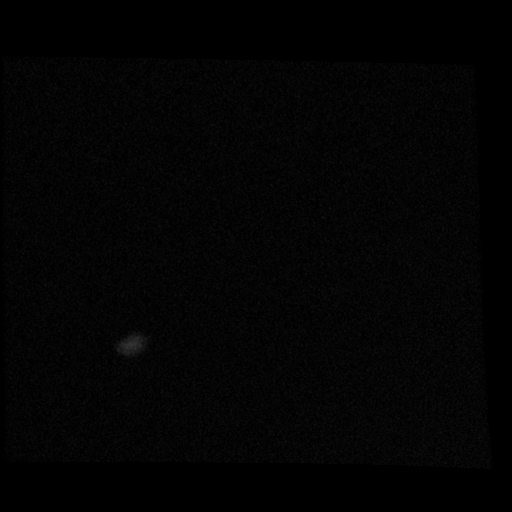
[im 8/50]
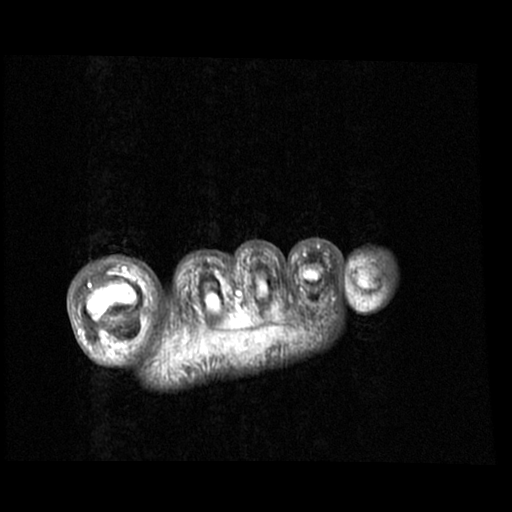
[im 15/50]
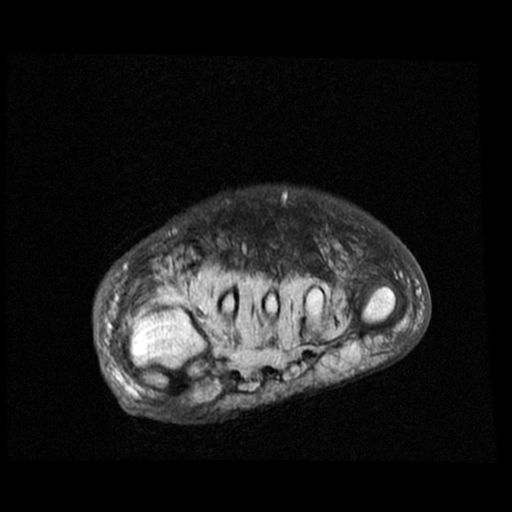
[im 22/50]
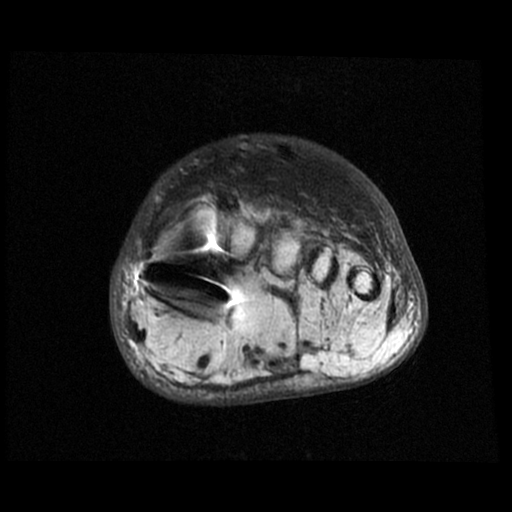
[im 29/50]
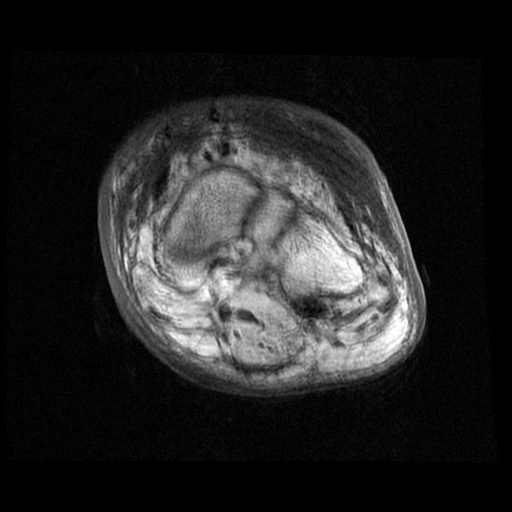
[im 36/50]
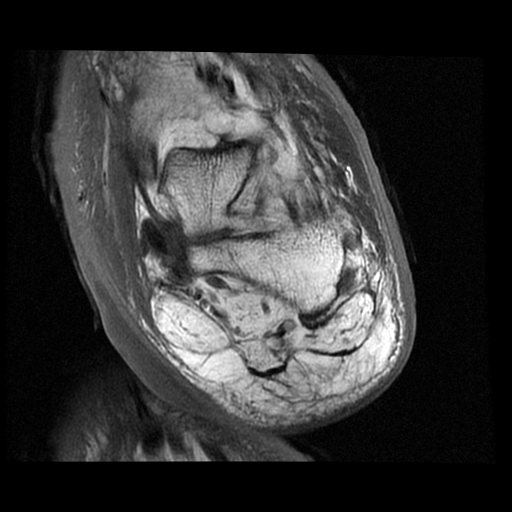
[im 43/50]
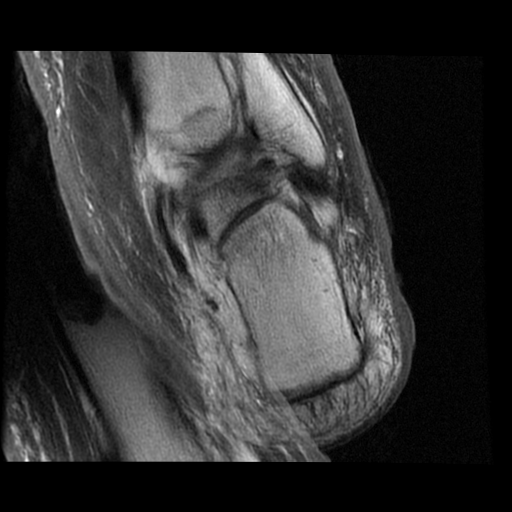
[im 50/50]
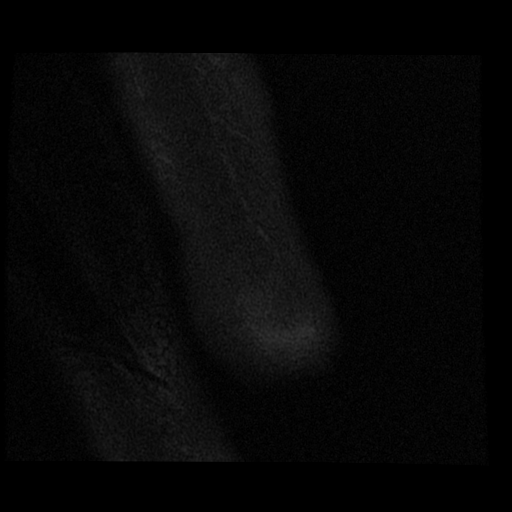

[Series 5: T2 fat-sat · coronal · 4.0mm · 0.70mm/px · 9 of 50 slices shown]
[im 1/50]
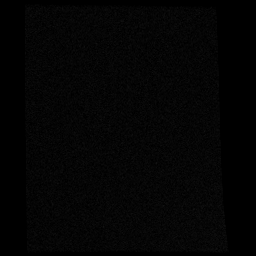
[im 7/50]
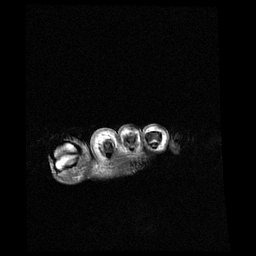
[im 13/50]
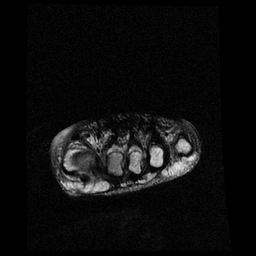
[im 19/50]
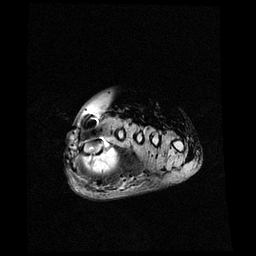
[im 25/50]
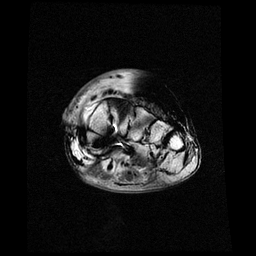
[im 31/50]
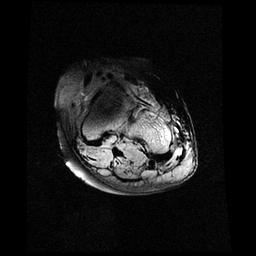
[im 37/50]
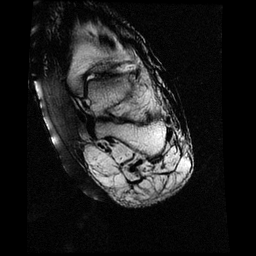
[im 43/50]
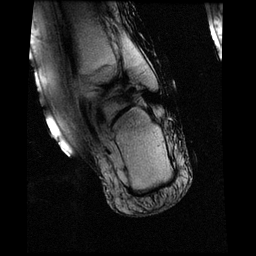
[im 50/50]
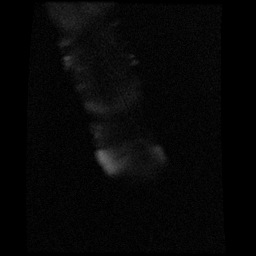

[Series 8: T1 · oblique · 5.0mm · 0.39mm/px · 4 of 20 slices shown (2 of 3)]
[im 1/20]
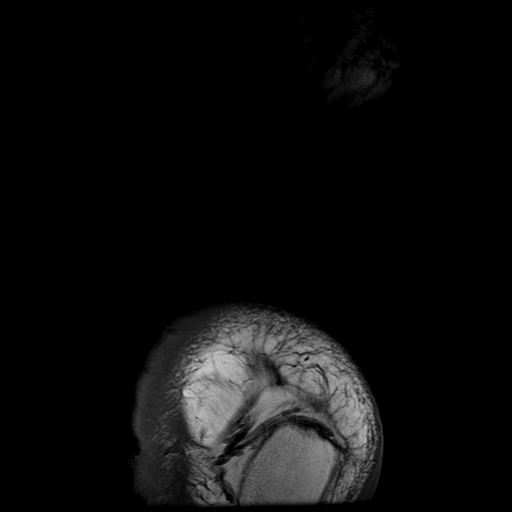
[im 7/20]
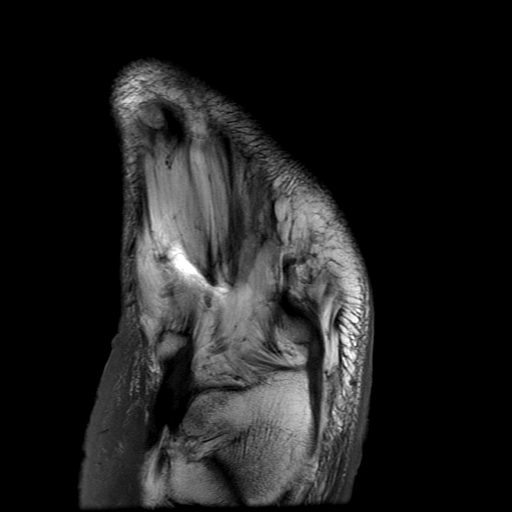
[im 13/20]
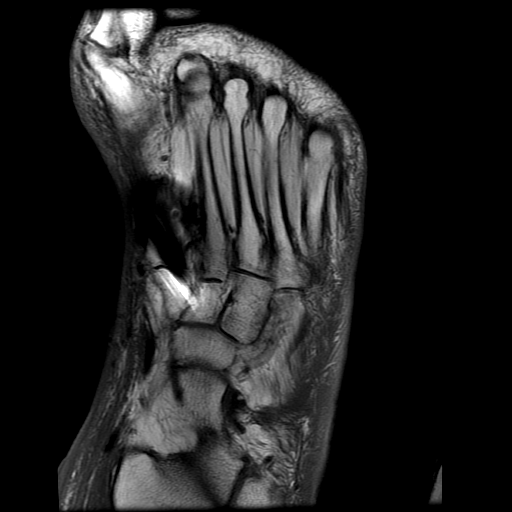
[im 20/20]
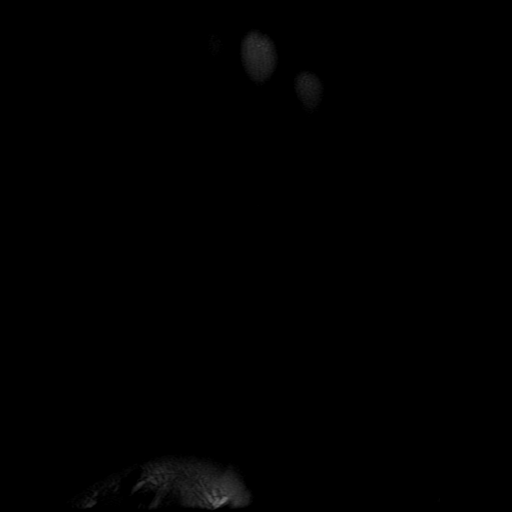

[Series 9: T1 · sagittal · 5.0mm · 0.39mm/px · 4 of 20 slices shown (3 of 3)]
[im 1/20]
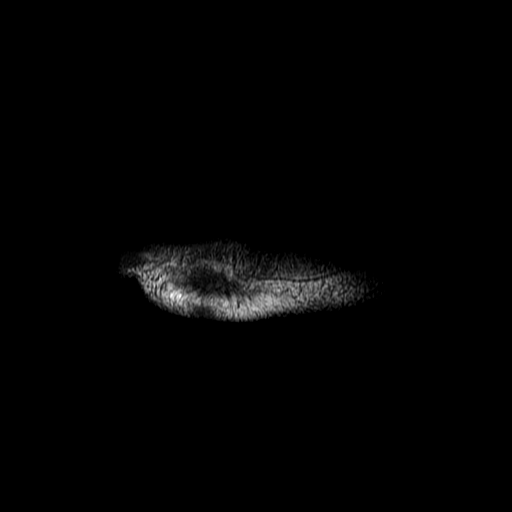
[im 7/20]
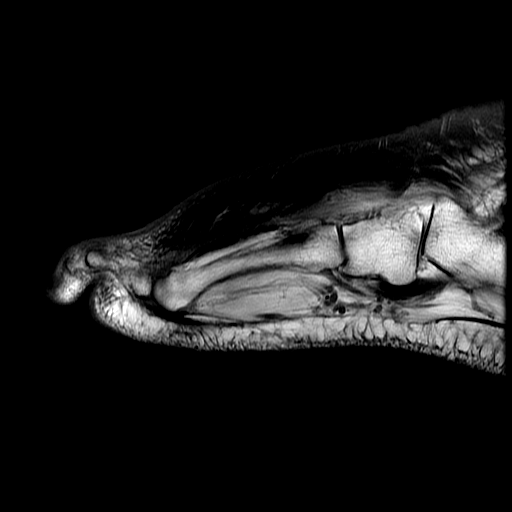
[im 13/20]
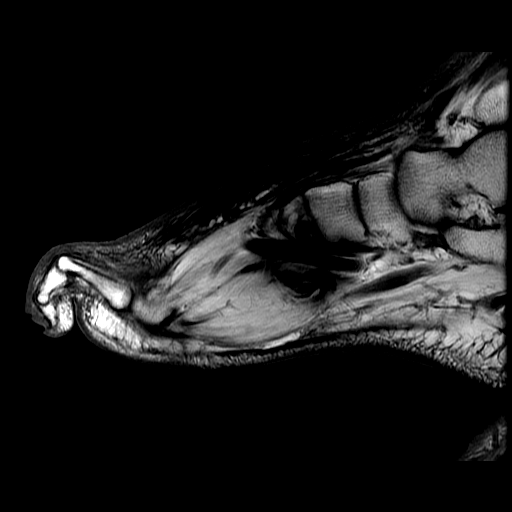
[im 20/20]
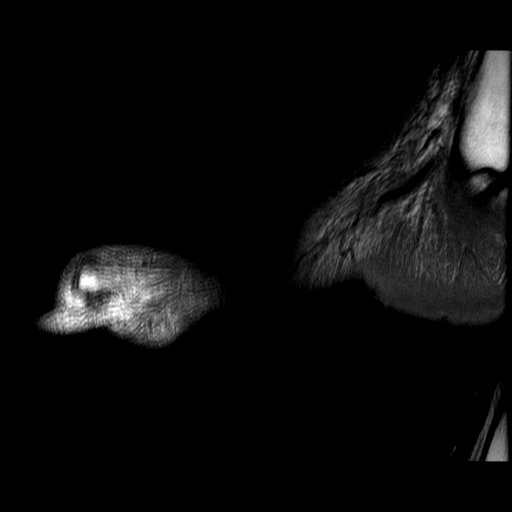

[25 of 40 positions shown; findings below may reference images not displayed]

FINDINGS: On some of are images, we performed are osteomyelitis protocol to
include the whole foot rather than just before foot. On other
sequences, only the forefoot was included.

And metal screw observed at the first tarsometatarsal articulation.
Prominent cutaneous and subcutaneous edema along the ankle and
dorsal forefoot.

No significant abnormal osseous edema characteristic of
osteomyelitis identified. Lisfranc joint alignment appears normal;
the Lisfranc ligament, if intact, is obscured by the metal artifact
from the adjacent first tarsometatarsal joint screw.

I do not observe definite gas in the soft tissues. There may be some
prominent blistering in the medial ankle, correlate with visual
inspection.
IMPRESSION: 1. Extensive cutaneous and subcutaneous edema in the ankle and foot
without osteomyelitis or drainable abscess identified. may be some
blistering along the medial ankle. Please note that IV contrast was
not administered, which can adversely affect assessment for small or
abscesses.
2. Screw fixator at the first tarsometatarsal articulation.

## 2016-09-01 IMAGING — DX DG FOOT COMPLETE 3+V*L*
3 series · 3 of 3 positions shown · non-contrast
Comparison: 10/04/2013

CLINICAL DATA: Diabetic ulcer of foot, wound cellulitis

EXAM:
LEFT FOOT - COMPLETE 3+ VIEW

[foot ap]
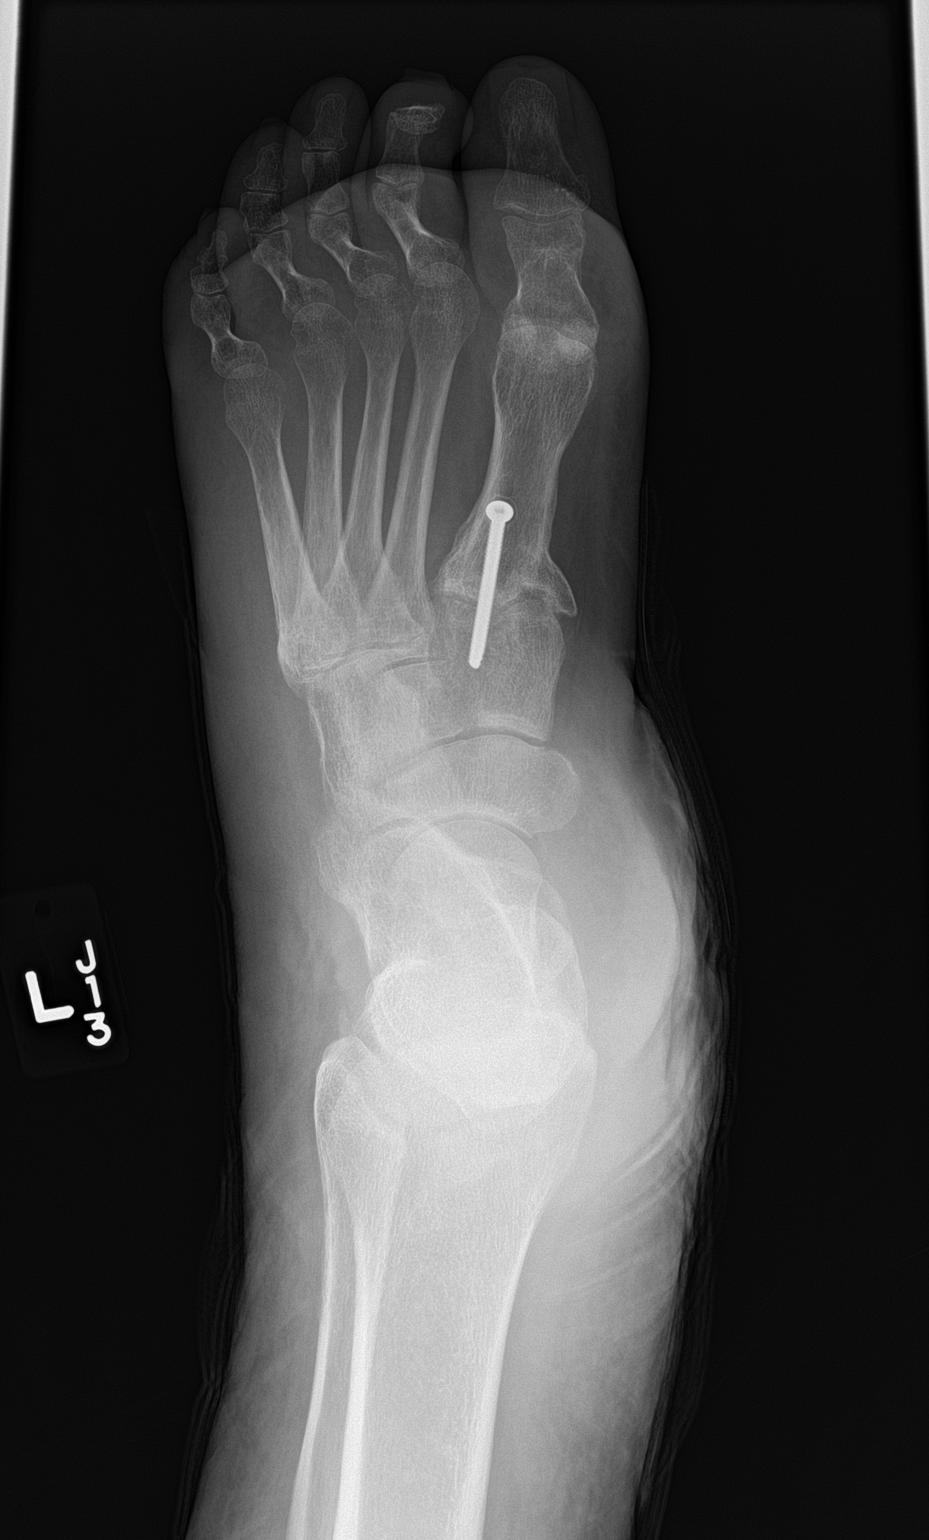

[foot lat]
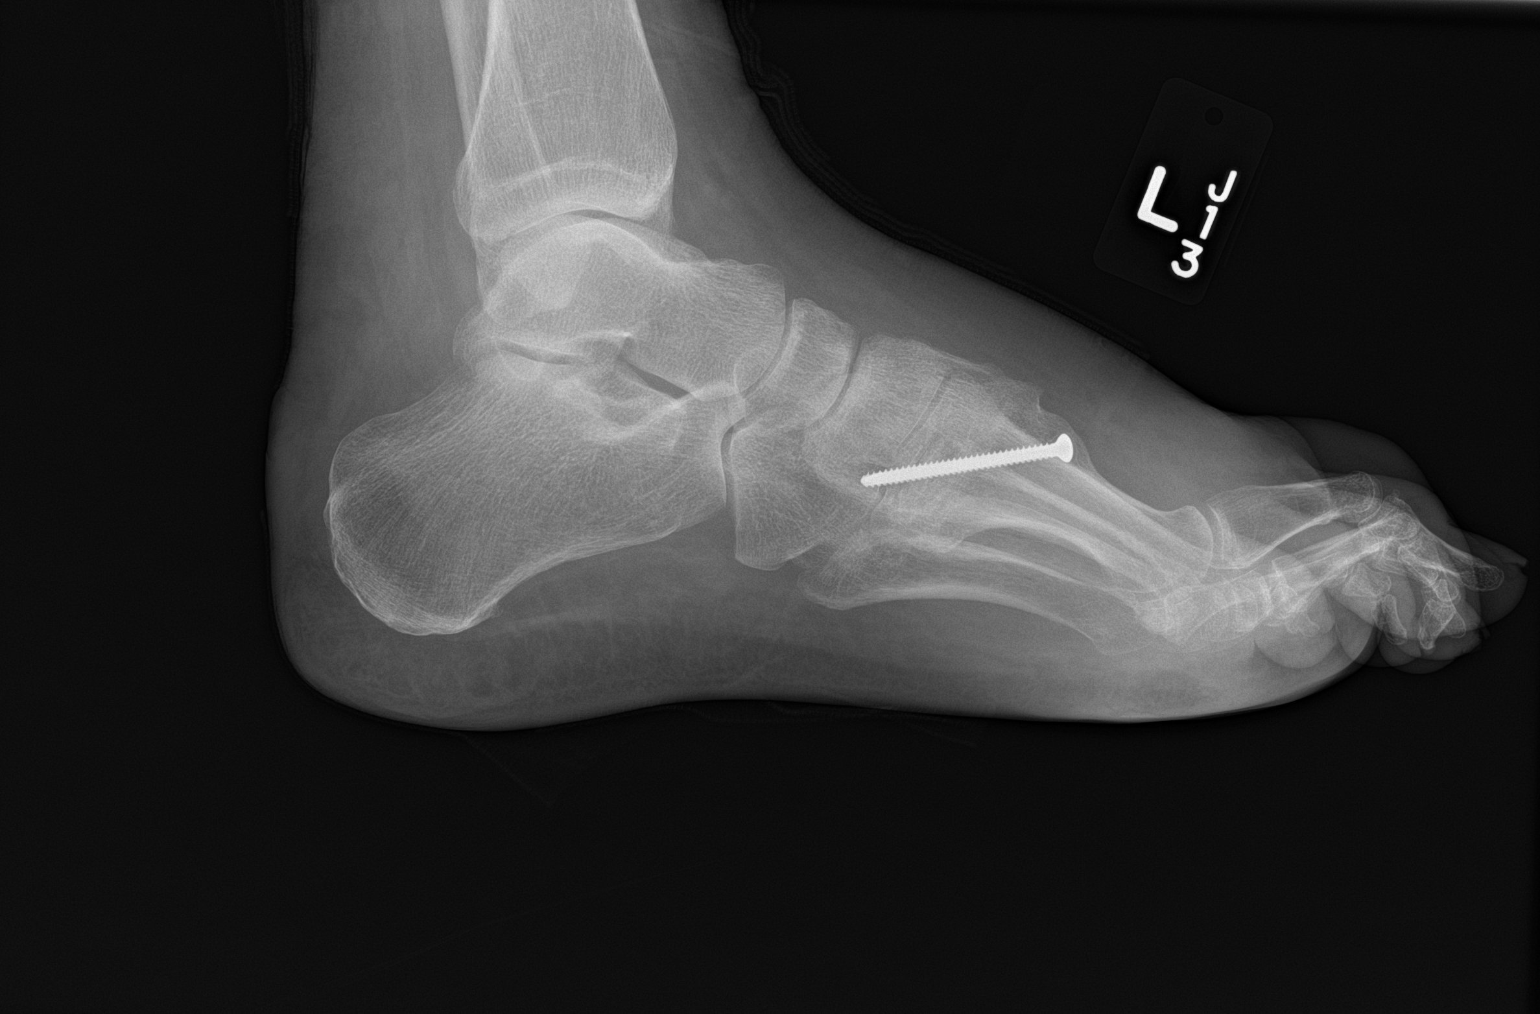

[foot obl]
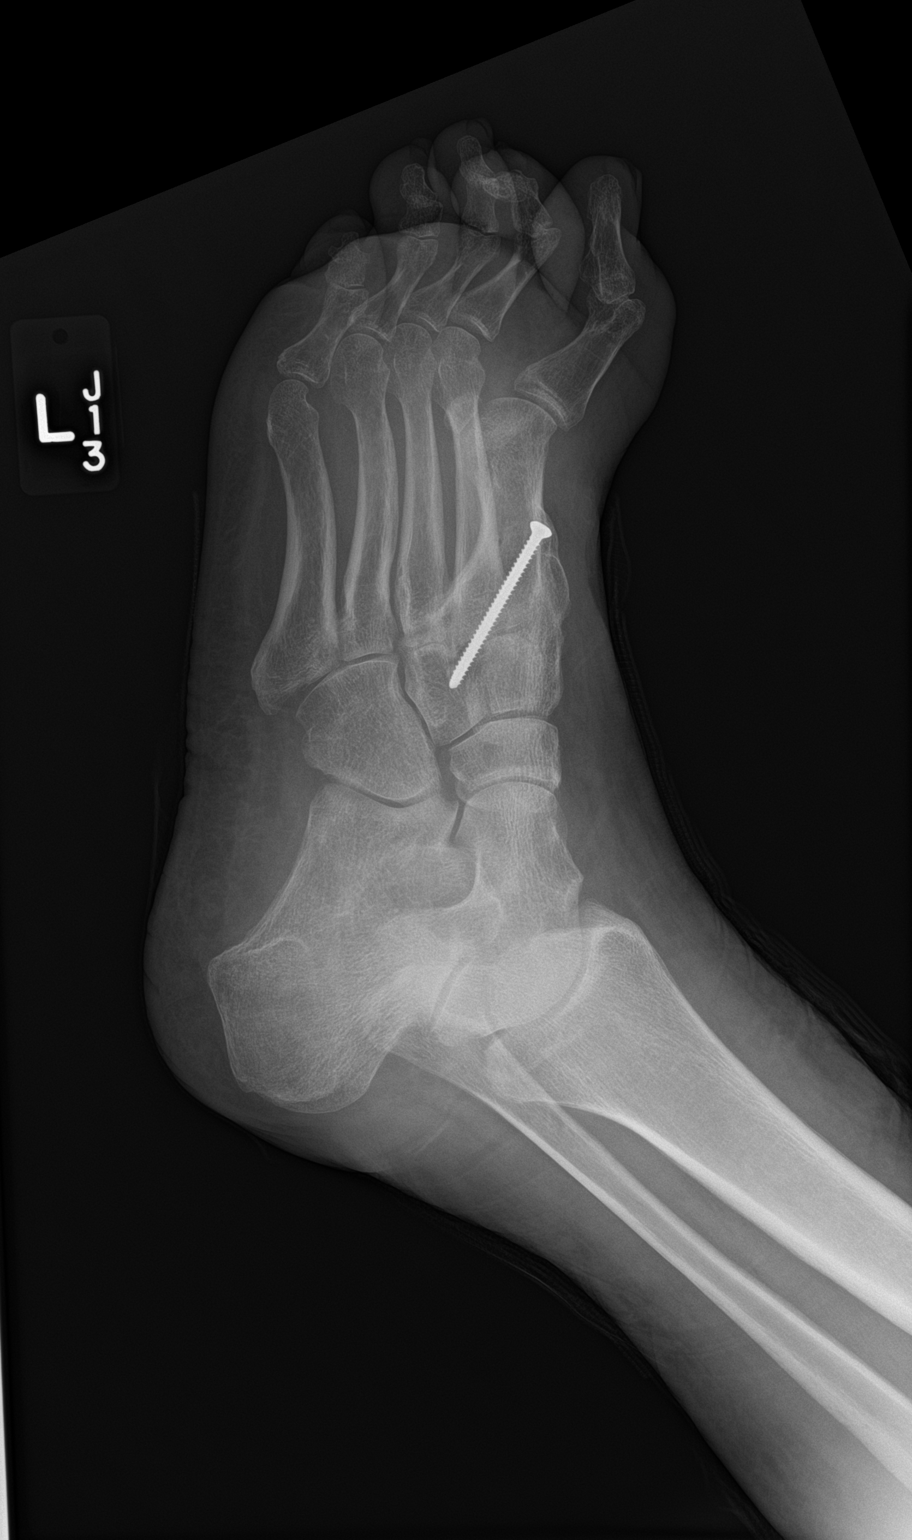

[3 of 3 positions shown; findings below may reference images not displayed]

FINDINGS: Prior fusion of first TMT joint.

Diffuse osseous demineralization.

Scattered soft tissue swelling.

Remaining joint spaces preserved.

No acute fracture, dislocation, or bone destruction.

Specifically, no soft tissue gas or radiographic evidence of
osteomyelitis identified.
IMPRESSION: Osseous demineralization with diffuse soft tissue swelling LEFT
foot.

Post first TMT joint fusion.

No acute osseous abnormalities.

## 2016-09-04 ENCOUNTER — Encounter (HOSPITAL_COMMUNITY): Payer: Self-pay | Admitting: Emergency Medicine

## 2016-09-04 ENCOUNTER — Emergency Department (HOSPITAL_COMMUNITY): Payer: Medicare Other

## 2016-09-04 ENCOUNTER — Emergency Department (HOSPITAL_COMMUNITY)
Admission: EM | Admit: 2016-09-04 | Discharge: 2016-09-04 | Disposition: A | Discharge status: Home / Self Care | Payer: Medicare Other | Attending: Emergency Medicine | Admitting: Emergency Medicine

## 2016-09-04 DIAGNOSIS — Z96652 Presence of left artificial knee joint: Secondary | ICD-10-CM | POA: Insufficient documentation

## 2016-09-04 DIAGNOSIS — Z96641 Presence of right artificial hip joint: Secondary | ICD-10-CM | POA: Insufficient documentation

## 2016-09-04 DIAGNOSIS — R410 Disorientation, unspecified: Secondary | ICD-10-CM | POA: Insufficient documentation

## 2016-09-04 DIAGNOSIS — T50901A Poisoning by unspecified drugs, medicaments and biological substances, accidental (unintentional), initial encounter: Secondary | ICD-10-CM | POA: Diagnosis not present

## 2016-09-04 DIAGNOSIS — E119 Type 2 diabetes mellitus without complications: Secondary | ICD-10-CM | POA: Diagnosis not present

## 2016-09-04 DIAGNOSIS — R400 Somnolence: Secondary | ICD-10-CM | POA: Diagnosis present

## 2016-09-04 DIAGNOSIS — Z96651 Presence of right artificial knee joint: Secondary | ICD-10-CM | POA: Insufficient documentation

## 2016-09-04 DIAGNOSIS — Z7984 Long term (current) use of oral hypoglycemic drugs: Secondary | ICD-10-CM | POA: Insufficient documentation

## 2016-09-04 DIAGNOSIS — Z79899 Other long term (current) drug therapy: Secondary | ICD-10-CM | POA: Diagnosis not present

## 2016-09-04 DIAGNOSIS — F1910 Other psychoactive substance abuse, uncomplicated: Secondary | ICD-10-CM | POA: Insufficient documentation

## 2016-09-04 DIAGNOSIS — F17210 Nicotine dependence, cigarettes, uncomplicated: Secondary | ICD-10-CM | POA: Diagnosis not present

## 2016-09-04 LAB — CBC WITH DIFFERENTIAL/PLATELET
BASOS ABS: 0 10*3/uL (ref 0.0–0.1)
BASOS PCT: 1 %
Eosinophils Absolute: 0.1 10*3/uL (ref 0.0–0.7)
Eosinophils Relative: 2 %
HEMATOCRIT: 46 % (ref 39.0–52.0)
HEMOGLOBIN: 15.4 g/dL (ref 13.0–17.0)
LYMPHS PCT: 27 %
Lymphs Abs: 2 10*3/uL (ref 0.7–4.0)
MCH: 28.8 pg (ref 26.0–34.0)
MCHC: 33.5 g/dL (ref 30.0–36.0)
MCV: 86 fL (ref 78.0–100.0)
MONO ABS: 0.4 10*3/uL (ref 0.1–1.0)
MONOS PCT: 5 %
NEUTROS ABS: 4.9 10*3/uL (ref 1.7–7.7)
NEUTROS PCT: 65 %
Platelets: 154 10*3/uL (ref 150–400)
RBC: 5.35 MIL/uL (ref 4.22–5.81)
RDW: 14.7 % (ref 11.5–15.5)
WBC: 7.4 10*3/uL (ref 4.0–10.5)

## 2016-09-04 LAB — URINALYSIS, ROUTINE W REFLEX MICROSCOPIC
Bacteria, UA: NONE SEEN
Bilirubin Urine: NEGATIVE
HGB URINE DIPSTICK: NEGATIVE
Ketones, ur: 5 mg/dL — AB
Leukocytes, UA: NEGATIVE
Nitrite: NEGATIVE
Protein, ur: NEGATIVE mg/dL
RBC / HPF: NONE SEEN RBC/hpf (ref 0–5)
SPECIFIC GRAVITY, URINE: 1.001 — AB (ref 1.005–1.030)
Squamous Epithelial / LPF: NONE SEEN
pH: 6 (ref 5.0–8.0)

## 2016-09-04 LAB — RAPID URINE DRUG SCREEN, HOSP PERFORMED
Amphetamines: NOT DETECTED
BARBITURATES: NOT DETECTED
BENZODIAZEPINES: NOT DETECTED
COCAINE: POSITIVE — AB
Opiates: POSITIVE — AB
TETRAHYDROCANNABINOL: NOT DETECTED

## 2016-09-04 LAB — COMPREHENSIVE METABOLIC PANEL
ALBUMIN: 3.9 g/dL (ref 3.5–5.0)
ALT: 14 U/L — ABNORMAL LOW (ref 17–63)
ANION GAP: 9 (ref 5–15)
AST: 14 U/L — ABNORMAL LOW (ref 15–41)
Alkaline Phosphatase: 102 U/L (ref 38–126)
BILIRUBIN TOTAL: 0.8 mg/dL (ref 0.3–1.2)
BUN: 9 mg/dL (ref 6–20)
CHLORIDE: 96 mmol/L — AB (ref 101–111)
CO2: 29 mmol/L (ref 22–32)
Calcium: 8.8 mg/dL — ABNORMAL LOW (ref 8.9–10.3)
Creatinine, Ser: 0.86 mg/dL (ref 0.61–1.24)
GFR calc Af Amer: >60 mL/min (ref >60)
GLUCOSE: 214 mg/dL — AB (ref 65–99)
POTASSIUM: 3.2 mmol/L — AB (ref 3.5–5.1)
Sodium: 134 mmol/L — ABNORMAL LOW (ref 135–145)
Total Protein: 7 g/dL (ref 6.5–8.1)

## 2016-09-04 LAB — ETHANOL: Alcohol, Ethyl (B): <5 mg/dL (ref <5)

## 2016-09-04 LAB — CBG MONITORING, ED: Glucose-Capillary: 235 mg/dL — ABNORMAL HIGH (ref 65–99)

## 2016-09-04 MED ORDER — SODIUM CHLORIDE 0.9 % IV BOLUS (SEPSIS)
1000.0000 mL | Freq: Once | INTRAVENOUS | Status: AC
  Administered 2016-09-04: 1000 mL via INTRAVENOUS

## 2016-09-04 MED ORDER — SODIUM CHLORIDE 0.9 % IV SOLN
INTRAVENOUS | Status: DC
  Administered 2016-09-04: 100 mL/h via INTRAVENOUS

## 2016-09-04 MED ORDER — NALOXONE HCL 2 MG/2ML IJ SOSY
1.0000 mg/h | PREFILLED_SYRINGE | INTRAVENOUS | Status: DC
  Filled 2016-09-04: qty 4

## 2016-09-04 NOTE — ED Triage Notes (Signed)
Ems reports pt was found unresponsive choking on cake in his recliner.  Drank about 6 beers last night and took about 6 oxycodone per pt.  Given Narcan 2mg  nasally by ems with resolution of unresponsiveness.  Pt's chart has hx of heroin abuse, but pt denies.

## 2016-09-04 NOTE — ED Notes (Signed)
Pt now put self back into bed and is requesting for monitor to be off.  Monitor removed at pt request as it is agitating him more.  Will spot check VS.

## 2016-09-04 NOTE — ED Notes (Signed)
No change in pt status.  Is easily aroused, but falls asleep easily.  Is oriented and can give family phone number.  Contacted mother to arrange ride home.

## 2016-09-04 NOTE — Discharge Instructions (Signed)
Return for any new or worse symptoms.

## 2016-09-04 NOTE — ED Notes (Signed)
Pt continues to sleep intermittently, but can be aroused when talked to.

## 2016-09-04 NOTE — ED Notes (Signed)
Pt is awake and is somewhat confused at times.  Mumbles incoherently and when redirected states "oh yeah I know that."  Pt is technically alert and oriented to person, place, and time yet some things stated just don't make sense.

## 2016-09-04 NOTE — ED Notes (Signed)
Pt remains drowsy and somewhat disoriented.  Easily arouses to voice, but dozes off again.  Ate 100% of lunch.

## 2016-09-04 NOTE — ED Notes (Signed)
Pt has gotten self out of bed and is now sitting in chair.  Climbed out of the end of the stretcher.  Asked pt why he did not use his call light and he states "I forgot."  Pt states he must sit in chair because he "is all tangled up."  Left pt in chair as he is happy there.

## 2016-09-04 NOTE — ED Notes (Signed)
Pt requested the phone to call his family, but then fell asleep with it in his hand.  VSS, will continue to monitor.

## 2016-09-04 NOTE — ED Provider Notes (Addendum)
[facility] DEPT Provider Note   CSN: [id number] Arrival date & time: 09/04/16  0700     History   Chief Complaint Chief Complaint  Patient presents with  . Drug Overdose    HPI [name] is a 51 y.o. male.  Patient brought in by EMS for presumed opiate overdose. Patient at home son visiting with him. Patient was in lounge chair started choking. On food. Patient admitted to taking Percocet. Patient has a past history of substance abuse problems to include alcohol and opiates. EMS gave him a total of 2 mg of Narcan intranasally. Patient woke up. Upon arrival here patient remained awake they would occasionally get drowsy. Patient had no specific complaints. Patient denied any intentional overdose or any suicidal thoughts.      Past Medical History:  Diagnosis Date  . Anxiety   . Arthritis    "everywhere" (04/21/2012)  . Cellulitis and abscess of foot 12/19/2013   LEFT FOOT  . Chronic pain   . DDD (degenerative disc disease)   . Depression   . Diabetes mellitus without complication (HCC)    borderline  . Diabetic foot ulcer ([place])   . ETOH abuse   . GERD (gastroesophageal reflux disease)    tums  . History of blood transfusion    "related to left knee OR; probably right hip too" (04/21/2012)  . Mental disorder   . Neuromuscular disorder (HCC)    neuropathy  . Noncompliance   . Open wound    bottom of foot  . Pneumonia ~ 2012  . Polysubstance abuse    etoh, cocaine, heroin  . Stroke [facility]) 2008   "they said I might have had one during right hip replacement" (04/21/2012)    Patient Active Problem List   Diagnosis Date Noted  . SVT (supraventricular tachycardia) ([place]) 08/17/2016  . Tobacco use disorder 10/25/2014  . Diabetes mellitus type 2 in nonobese (HCC)   . Swelling   . Type 2 diabetes mellitus with left diabetic foot ulcer ([place]) 12/18/2013  . Diabetes mellitus due to underlying condition with foot ulcer ([place]) 12/18/2013  . Left leg cellulitis 12/18/2013  .  Cellulitis of right lower extremity 07/19/2013  . Cocaine abuse 03/29/2012  . Generalized anxiety disorder 03/29/2012  . Panic attacks 03/29/2012  . Alcohol dependence ([place]) 08/05/2011    Class: Chronic  . Substance abuse 05/31/2011  . Foot ulcer, left ([place]) 02/15/2011  . Fasting hyperglycemia 02/15/2011  . Neuropathic ulcer of foot ([place]) 02/15/2011    Past Surgical History:  Procedure Laterality Date  . JOINT REPLACEMENT    . KNEE ARTHROSCOPY Bilateral 1980's/1990's  . LUNG LOBECTOMY Left ~ 2006  . LUNG LOBECTOMY    . METATARSAL OSTEOTOMY  10/29/2011   Procedure: METATARSAL OSTEOTOMY;  Surgeon: [name], MD;  Location: [facility];  Service: Orthopedics;  Laterality: Left;  Left 1st Metatarsal Dorsal Closing Wedge   . REVISION TOTAL HIP ARTHROPLASTY Right 2008   "4-5 months after replacement" (04/21/2012)  . TOTAL HIP ARTHROPLASTY Right 2008  . TOTAL KNEE ARTHROPLASTY Left 2006  . TOTAL KNEE ARTHROPLASTY Right 04/20/2012  . TOTAL KNEE ARTHROPLASTY Right 04/20/2012   Procedure: TOTAL KNEE ARTHROPLASTY;  Surgeon: [name], MD;  Location: [facility];  Service: Orthopedics;  Laterality: Right;  Right Total Knee Arthroplasty       Home Medications    Prior to Admission medications   Medication Sig Start Date End Date Taking? Authorizing Provider  blood glucose meter kit and supplies KIT  Dispense based on patient and insurance preference. Use up to four times daily as directed. (FOR ICD-9 250.00, 250.01). 08/18/16  Yes Orvan Falconer, MD  diltiazem (CARDIZEM) 30 MG tablet Take 1 tablet (30 mg total) by mouth 2 (two) times daily. 08/18/16  Yes Orvan Falconer, MD  escitalopram (LEXAPRO) 20 MG tablet Take 20 mg by mouth daily.    Yes [provider]  gabapentin (NEURONTIN) 400 MG capsule Take 1 capsule (400 mg total) by mouth 4 (four) times daily. For substance withdrawal syndrome Patient taking differently: Take 400 mg by mouth 3 (three) times daily. For substance withdrawal syndrome 03/29/13   Yes Lindell Spar I, NP  metFORMIN (GLUCOPHAGE) 500 MG tablet Take 1 tablet (500 mg total) by mouth 2 (two) times daily with a meal. 01/15/14  Yes Philemon Kingdom, MD  mirtazapine (REMERON) 30 MG tablet Take 30 mg by mouth at bedtime.   Yes [provider]  QUEtiapine (SEROQUEL) 200 MG tablet Take 1 tablet (200 mg total) by mouth at bedtime. For mood control 03/29/13  Yes Lindell Spar I, NP  thiamine 100 MG tablet Take 1 tablet (100 mg total) by mouth daily. 08/19/16  Yes Orvan Falconer, MD    Family History Family History  Problem Relation Age of Onset  . Diabetes Mother   . Diabetes Father     Social History Social History  Substance Use Topics  . Smoking status: Current Every Day Smoker    Packs/day: 1.00    Years: 30.00    Types: Cigarettes  . Smokeless tobacco: Never Used  . Alcohol use 0.0 oz/week     Allergies   Benadryl [diphenhydramine hcl] and Trazodone and nefazodone   Review of Systems Review of Systems  Constitutional: Negative for fever.  HENT: Negative for congestion and sore throat.   Eyes: Negative for redness.  Respiratory: Negative for shortness of breath.   Cardiovascular: Negative for chest pain.  Gastrointestinal: Negative for abdominal pain, nausea and vomiting.  Genitourinary: Negative for dysuria.  Musculoskeletal: Negative for back pain.  Skin: Negative for rash.  Neurological: Negative for headaches.  Hematological: Does not bruise/bleed easily.  Psychiatric/Behavioral: Positive for confusion.     Physical Exam Updated Vital Signs BP 122/68   Pulse 62   Temp 98 F (36.7 C) (Oral)   Resp 16   Ht 1.727 m (_0 )   Wt 95.7 kg (211 lb)   SpO2 98%   BMI 32.08 kg/m   Physical Exam  Constitutional: He appears well-developed and well-nourished. No distress.  HENT:  Head: Normocephalic and atraumatic.  Next membranes dry  Eyes: Pupils are equal, round, and reactive to light. Conjunctivae and EOM are normal.  Neck: Normal range of  motion. Neck supple.  Cardiovascular: Normal rate and regular rhythm.   Pulmonary/Chest: Effort normal and breath sounds normal. No respiratory distress.  Abdominal: Soft. Bowel sounds are normal. There is no tenderness.  Musculoskeletal: Normal range of motion. He exhibits no edema.  Neurological: He is alert. No cranial nerve deficit or sensory deficit. He exhibits normal muscle tone.  Patient intermittently drowsy other times alert. Was able to sit up and eat and cut his food. Patient at times does appear slightly confused. Other times very coherent.  Skin: Skin is warm. No rash noted.  Nursing note and vitals reviewed.    ED Treatments / Results  Labs (all labs ordered are listed, but only abnormal results are displayed) Labs Reviewed  COMPREHENSIVE METABOLIC PANEL - Abnormal; Notable for the  following:       Result Value   Sodium 134 (*)    Potassium 3.2 (*)    Chloride 96 (*)    Glucose, Bld 214 (*)    Calcium 8.8 (*)    AST 14 (*)    ALT 14 (*)    All other components within normal limits  RAPID URINE DRUG SCREEN, HOSP PERFORMED - Abnormal; Notable for the following:    Opiates POSITIVE (*)    Cocaine POSITIVE (*)    All other components within normal limits  URINALYSIS, ROUTINE W REFLEX MICROSCOPIC - Abnormal; Notable for the following:    Color, Urine STRAW (*)    Specific Gravity, Urine 1.001 (*)    Glucose, UA >=500 (*)    Ketones, ur 5 (*)    All other components within normal limits  CBG MONITORING, ED - Abnormal; Notable for the following:    Glucose-Capillary 235 (*)    All other components within normal limits  ETHANOL  CBC WITH DIFFERENTIAL/PLATELET    EKG  EKG Interpretation  Date/Time:  Friday September 04 2016 07:05:23 EDT Ventricular Rate:  88 PR Interval:    QRS Duration: 107 QT Interval:  389 QTC Calculation: 471 R Axis:   111 Text Interpretation:  Sinus rhythm Right axis deviation Borderline low voltage, extremity leads Baseline wander in  lead(s) V2 V5 V6 Confirmed by Fredia Sorrow [id number]) on 09/04/2016 8:54:31 AM       Radiology Ct Head Wo Contrast  Result Date: 09/04/2016 CLINICAL DATA:  Altered mental status. EXAM: CT HEAD WITHOUT CONTRAST TECHNIQUE: Contiguous axial images were obtained from the base of the skull through the vertex without intravenous contrast. COMPARISON:  CT head dated August 16, 2016. FINDINGS: Brain: No evidence of acute infarction, hemorrhage, hydrocephalus, extra-axial collection or mass lesion/mass effect. Mild age-related cerebral atrophy with compensatory dilatation of the ventricles. Vascular: No hyperdense vessel or unexpected calcification. Skull: Normal. Negative for fracture or focal lesion. Sinuses/Orbits: Large mucous retention cysts in the right maxillary sinus, unchanged. The remaining paranasal sinuses and mastoid air cells are clear. The orbits are unremarkable. Other: None. IMPRESSION: No acute intracranial abnormality. Electronically Signed   By: Titus Dubin M.D.   On: 09/04/2016 13:39   Dg Chest Port 1 View  Result Date: 09/04/2016 CLINICAL DATA:  51 year old male with a truck overdose. History of PI, smoking, left lung lobectomy. EXAM: PORTABLE CHEST 1 VIEW COMPARISON:  08/16/2016 FINDINGS: The heart size and mediastinal contours are within normal limits. There are low lung volumes but no significant focal airspace opacity, sizeable effusions or pneumothorax. The visualized skeletal structures are unremarkable. IMPRESSION: No active disease. Electronically Signed   By: Kristopher Oppenheim M.D.   On: 09/04/2016 07:59    Procedures Procedures (including critical care time)  Medications Ordered in ED Medications  naloxone (NARCAN) 4 mg in dextrose 5 % 250 mL infusion (0 mg/hr Intravenous Hold 09/04/16 0748)  0.9 %  sodium chloride infusion (100 mL/hr Intravenous New Bag/Given 09/04/16 0758)  sodium chloride 0.9 % bolus 1,000 mL (0 mLs Intravenous Stopped 09/04/16 0928)  sodium chloride 0.9  % bolus 1,000 mL (0 mLs Intravenous Stopped 09/04/16 1104)     Initial Impression / Assessment and Plan / ED Course  I have reviewed the triage vital signs and the nursing notes.  Pertinent labs & imaging results that were available during my care of the patient were reviewed by me and considered in my medical decision making (see chart for details).  Patient intermittently drowsy. Has not received any other Narcan other than what he received by EMS prior to arrival. He will wake up he will follow commands. At times is very coherent. At times her some slight confusion. Patient was able to sit up cut his food and eat his meal without any difficulty. He was able to talk to registration without any difficulty. Contacted family members. Patient she does live by himself. His son was visiting overnight but is currently not there. Patient does have a history of substance abuse. Denies any heroin use. But admits to Percocet. Urine drug screen showed cocaine as well as opiates. No other significant lab findings.  Patient clinically remaining stable. Just not the completely alert and functional enough for discharge home at this time. Patient clearly denied any suicidal or intentional problem with the overdose.  Final Clinical Impressions(s) / ED Diagnoses   Final diagnoses:  Accidental drug overdose, initial encounter  Polysubstance abuse    New Prescriptions New Prescriptions   No medications on file     Fredia Sorrow, MD 09/04/16 1521  Addendum:  Patient now more alert. Wants to go home. Patient son works out of town as an Clinical biochemist. He'll be back in somewhere between 5-8 this evening. Patient stable for discharge home. Waiting ride.    Fredia Sorrow, MD 09/04/16 1606

## 2016-09-04 NOTE — ED Notes (Signed)
Patient transported to CT 

## 2016-09-10 ENCOUNTER — Ambulatory Visit (INDEPENDENT_AMBULATORY_CARE_PROVIDER_SITE_OTHER): Payer: Medicare Other | Admitting: Adult Health

## 2016-09-10 ENCOUNTER — Encounter: Payer: Self-pay | Admitting: Adult Health

## 2016-09-10 VITALS — BP 130/86 | HR 81 | Ht 75.0 in | Wt 202.0 lb

## 2016-09-10 DIAGNOSIS — I10 Essential (primary) hypertension: Secondary | ICD-10-CM

## 2016-09-10 DIAGNOSIS — I471 Supraventricular tachycardia: Secondary | ICD-10-CM | POA: Diagnosis not present

## 2016-09-10 MED ORDER — DILTIAZEM HCL 30 MG PO TABS
30.0000 mg | ORAL_TABLET | Freq: Two times a day (BID) | ORAL | 3 refills | Status: DC
Start: 2016-09-10 — End: 2018-06-17

## 2016-09-10 NOTE — Progress Notes (Signed)
Cardiology Office Note   Date:  09/10/2016   ID:  Aamir Mclinden, DOB [birth year], MRN [id number]  PCP:  Lucia Gaskins, MD  Cardiologist:   Lennie Muckle chief complaint on file.     History of Present Illness: [name] is a 51 y.o. male who presents for ongoing assessment and management post hospitalization after admission for dizziness and palpitations along with SVT with a heart rate of 196 bpm. The patient was found to be positive for cocaine, and EtOH. Other history includes diabetes and hypertension. The patient was advised against illicit drug use and continued on medication regimen. Diabetes was not well controlled and he was advised to follow-up with his primary care physician, Dr. Lorriane Shire. He also requested narcotic pain medication which was not provided.  Echocardiogram 08/17/2016 Left ventricle: The cavity size was normal. Wall thickness was   increased in a pattern of mild LVH. Systolic function was normal.   The estimated ejection fraction was in the range of 60% to 65%.   Wall motion was normal; there were no regional wall motion   abnormalities. Left ventricular diastolic function parameters   were normal. - Aortic valve: Valve area (VTI): 2.92 cm^2. Valve area (Vmax):   2.89 cm^2. Valve area (Vmean): 2.82 cm^2. - Technically adequate study.  He has no complaints today of recurrent rapid heart rhythm dizziness or chest pain. He has been medically compliant with the exception of the last 3 days when he has run out of diltiazem. The patient has followed up with PCP.  Past Medical History:  Diagnosis Date  . Anxiety   . Arthritis    "everywhere" (04/21/2012)  . Cellulitis and abscess of foot 12/19/2013   LEFT FOOT  . Chronic pain   . DDD (degenerative disc disease)   . Depression   . Diabetes mellitus without complication (HCC)    borderline  . Diabetic foot ulcer ([place])   . ETOH abuse   . GERD (gastroesophageal reflux disease)    tums  . History of blood  transfusion    "related to left knee OR; probably right hip too" (04/21/2012)  . Mental disorder   . Neuromuscular disorder (HCC)    neuropathy  . Noncompliance   . Open wound    bottom of foot  . Pneumonia ~ 2012  . Polysubstance abuse    etoh, cocaine, heroin  . Stroke [facility]) 2008   "they said I might have had one during right hip replacement" (04/21/2012)    Past Surgical History:  Procedure Laterality Date  . JOINT REPLACEMENT    . KNEE ARTHROSCOPY Bilateral 1980's/1990's  . LUNG LOBECTOMY Left ~ 2006  . LUNG LOBECTOMY    . METATARSAL OSTEOTOMY  10/29/2011   Procedure: METATARSAL OSTEOTOMY;  Surgeon: [name], MD;  Location: [facility];  Service: Orthopedics;  Laterality: Left;  Left 1st Metatarsal Dorsal Closing Wedge   . REVISION TOTAL HIP ARTHROPLASTY Right 2008   "4-5 months after replacement" (04/21/2012)  . TOTAL HIP ARTHROPLASTY Right 2008  . TOTAL KNEE ARTHROPLASTY Left 2006  . TOTAL KNEE ARTHROPLASTY Right 04/20/2012  . TOTAL KNEE ARTHROPLASTY Right 04/20/2012   Procedure: TOTAL KNEE ARTHROPLASTY;  Surgeon: [name], MD;  Location: [place];  Service: Orthopedics;  Laterality: Right;  Right Total Knee Arthroplasty     Current Outpatient Prescriptions  Medication Sig Dispense Refill  . blood glucose meter kit and supplies KIT Dispense based on patient and insurance preference. Use up to four times daily  as directed. (FOR ICD-9 250.00, 250.01). 1 each 0  . diltiazem (CARDIZEM) 30 MG tablet Take 1 tablet (30 mg total) by mouth 2 (two) times daily. 180 tablet 3  . escitalopram (LEXAPRO) 20 MG tablet Take 20 mg by mouth daily.     Marland Kitchen gabapentin (NEURONTIN) 400 MG capsule Take 1 capsule (400 mg total) by mouth 4 (four) times daily. For substance withdrawal syndrome (Patient taking differently: Take 400 mg by mouth 3 (three) times daily. For substance withdrawal syndrome) 120 capsule 0  . metFORMIN (GLUCOPHAGE) 500 MG tablet Take 1 tablet (500 mg total) by mouth 2 (two) times daily  with a meal. 180 tablet 0  . mirtazapine (REMERON) 30 MG tablet Take 30 mg by mouth at bedtime.    Marland Kitchen QUEtiapine (SEROQUEL) 200 MG tablet Take 1 tablet (200 mg total) by mouth at bedtime. For mood control 30 tablet 0  . thiamine 100 MG tablet Take 1 tablet (100 mg total) by mouth daily. 30 tablet 1   No current facility-administered medications for this visit.     Allergies:   Benadryl [diphenhydramine hcl] and Trazodone and nefazodone    Social History:  The patient  reports that he has been smoking Cigarettes.  He has a 30.00 pack-year smoking history. He has never used smokeless tobacco. He reports that he drinks alcohol. He reports that he uses drugs, including Cocaine.   Family History:  The patient's family history includes Diabetes in his father and mother.    ROS: All other systems are reviewed and negative. Unless otherwise mentioned in H&P    PHYSICAL EXAM: VS:  BP 130/86   Pulse 81   Ht _0  (1.905 m)   Wt 202 lb (91.6 kg)   SpO2 98%   BMI 25.25 kg/m  , BMI Body mass index is 25.25 kg/m. GEN: Well nourished, well developed, in no acute distress  HEENT: normal  Neck: no JVD, carotid bruits, or masses Cardiac: RRR; no murmurs, rubs, or gallops,no edema  Respiratory:  Clear to auscultation bilaterally, normal work of breathing GI: soft, nontender, nondistended, + BS MS: no deformity or atrophy  Skin: warm and dry, no rash Neuro:  Strength and sensation are intact Psych: euthymic mood, full affect   Recent Labs: 08/16/2016: Magnesium 2.0 08/17/2016: TSH 1.317 09/04/2016: ALT 14; BUN 9; Creatinine, Ser 0.86; Hemoglobin 15.4; Platelets 154; Potassium 3.2; Sodium 134    Lipid Panel    Component Value Date/Time   CHOL 242 (H) 08/18/2016 0604   TRIG 720 (H) 08/18/2016 0604   HDL 29 (L) 08/18/2016 0604   CHOLHDL 8.3 08/18/2016 0604   VLDL UNABLE TO CALCULATE IF TRIGLYCERIDE OVER 400 mg/dL 08/18/2016 0604   LDLCALC UNABLE TO CALCULATE IF TRIGLYCERIDE OVER 400 mg/dL  08/18/2016 0604      Wt Readings from Last 3 Encounters:  09/10/16 202 lb (91.6 kg)  09/04/16 211 lb (95.7 kg)  08/17/16 211 lb (95.7 kg)    ASSESSMENT AND PLAN:  1. SVT: The patient will continue on diltiazem as directed. He is not had any further complaints of rapid heart rhythm despite being out of diltiazem of the last 3 days. We'll continue him on current dose. Echocardiogram was reassuring. No plans for ischemic testing at this time.  2. Illicit drug use: Advised on cessation on with EtOH cessation. He states that he has not drank or used since discharge from the hospital.  3. Hypertension: Currently well-controlled on medication regimen. No changes   4. Diabetes:  On metformin followed by primary care.    Current medicines are reviewed at length with the patient today.    Labs/ tests ordered today include:  Phill Myron. [place] Pugh, ANP, AACC   09/10/2016 2:17 PM    [facility] [address] [place], [place] [address] Phone: [phone]; Fax: [phone]

## 2016-09-10 NOTE — Patient Instructions (Signed)
Medication Instructions:  Your physician recommends that you continue on your current medications as directed. Please refer to the Current Medication list given to you today.   Labwork: NONE   Testing/Procedures: NONE   Follow-Up: Your physician wants you to follow-up in: 6 months with Dr. Harl Bowie. You will receive a reminder letter in the mail two months in advance. If you don't receive a letter, please call our office to schedule the follow-up appointment.   Any Other Special Instructions Will Be Listed Below (If Applicable).     If you need a refill on your cardiac medications before your next appointment, please call your pharmacy.

## 2016-09-18 ENCOUNTER — Encounter (HOSPITAL_COMMUNITY): Payer: Self-pay

## 2016-09-18 ENCOUNTER — Emergency Department (HOSPITAL_COMMUNITY)
Admission: EM | Admit: 2016-09-18 | Discharge: 2016-09-19 | Disposition: A | Discharge status: Home / Self Care | Payer: Medicare Other | Attending: Emergency Medicine | Admitting: Emergency Medicine

## 2016-09-18 DIAGNOSIS — E1140 Type 2 diabetes mellitus with diabetic neuropathy, unspecified: Secondary | ICD-10-CM | POA: Diagnosis not present

## 2016-09-18 DIAGNOSIS — Z7984 Long term (current) use of oral hypoglycemic drugs: Secondary | ICD-10-CM | POA: Insufficient documentation

## 2016-09-18 DIAGNOSIS — F17210 Nicotine dependence, cigarettes, uncomplicated: Secondary | ICD-10-CM | POA: Insufficient documentation

## 2016-09-18 DIAGNOSIS — Z79899 Other long term (current) drug therapy: Secondary | ICD-10-CM | POA: Diagnosis not present

## 2016-09-18 DIAGNOSIS — E119 Type 2 diabetes mellitus without complications: Secondary | ICD-10-CM | POA: Insufficient documentation

## 2016-09-18 DIAGNOSIS — F1010 Alcohol abuse, uncomplicated: Secondary | ICD-10-CM | POA: Diagnosis present

## 2016-09-18 DIAGNOSIS — F1410 Cocaine abuse, uncomplicated: Secondary | ICD-10-CM | POA: Diagnosis not present

## 2016-09-18 NOTE — ED Triage Notes (Signed)
Pt admits to using cocaine 45 minutes ago and has been drinking tonight.  Pt told ems that he had called a rehab facility in [place] and needs detox prior to going there.

## 2016-09-19 LAB — CBC WITH DIFFERENTIAL/PLATELET
BASOS ABS: 0.1 10*3/uL (ref 0.0–0.1)
BASOS PCT: 1 %
EOS ABS: 0.2 10*3/uL (ref 0.0–0.7)
EOS PCT: 2 %
HCT: 45.9 % (ref 39.0–52.0)
Hemoglobin: 15.5 g/dL (ref 13.0–17.0)
Lymphocytes Relative: 36 %
Lymphs Abs: 3.9 10*3/uL (ref 0.7–4.0)
MCH: 28.8 pg (ref 26.0–34.0)
MCHC: 33.8 g/dL (ref 30.0–36.0)
MCV: 85.2 fL (ref 78.0–100.0)
Monocytes Absolute: 0.4 10*3/uL (ref 0.1–1.0)
Monocytes Relative: 4 %
Neutro Abs: 6.5 10*3/uL (ref 1.7–7.7)
Neutrophils Relative %: 59 %
PLATELETS: 176 10*3/uL (ref 150–400)
RBC: 5.39 MIL/uL (ref 4.22–5.81)
RDW: 14.7 % (ref 11.5–15.5)
WBC: 11 10*3/uL — AB (ref 4.0–10.5)

## 2016-09-19 LAB — COMPREHENSIVE METABOLIC PANEL
ALK PHOS: 93 U/L (ref 38–126)
ALT: 15 U/L — ABNORMAL LOW (ref 17–63)
AST: 19 U/L (ref 15–41)
Albumin: 3.7 g/dL (ref 3.5–5.0)
Anion gap: 13 (ref 5–15)
BUN: 7 mg/dL (ref 6–20)
CALCIUM: 8.5 mg/dL — AB (ref 8.9–10.3)
CHLORIDE: 100 mmol/L — AB (ref 101–111)
CO2: 22 mmol/L (ref 22–32)
Creatinine, Ser: 0.79 mg/dL (ref 0.61–1.24)
Glucose, Bld: 205 mg/dL — ABNORMAL HIGH (ref 65–99)
Potassium: 3.5 mmol/L (ref 3.5–5.1)
Sodium: 135 mmol/L (ref 135–145)
Total Bilirubin: 0.5 mg/dL (ref 0.3–1.2)
Total Protein: 6.7 g/dL (ref 6.5–8.1)

## 2016-09-19 LAB — RAPID URINE DRUG SCREEN, HOSP PERFORMED
AMPHETAMINES: NOT DETECTED
BENZODIAZEPINES: NOT DETECTED
Barbiturates: NOT DETECTED
Cocaine: POSITIVE — AB
OPIATES: NOT DETECTED
Tetrahydrocannabinol: NOT DETECTED

## 2016-09-19 LAB — ETHANOL: ALCOHOL ETHYL (B): 165 mg/dL — AB (ref <5)

## 2016-09-19 NOTE — BH Assessment (Addendum)
Tele Assessment Note   Patient Name: [name] MRN: [id number] Referring Physician: Dr. Tomi Bamberger Location of Patient: [facility] Location of Provider: [place]  [name] is an 51 y.o. male, who presents voluntary and unaccompanied to [facility]. Clinician asked the pt, "what brought you to the hospital?" Pt replied, "try to dry out for a couple of days." Pt reported, he felt depressed, sometimes. Pt could not identify triggers of his depression. Pt denied, SI, HI, AVH, self-injurious behaviors and access to weapons.   Pt denied abuse. Pt reported, drinking twelve beers, six hours ago. Pt reported, using cocaine, six hours ago. Pt's BAL was 165 at 0001. Pt's UDS is positive for cocaine and opiates. Pt reported, he is starting to get chills and the shakes. Pt reported, he has been clean for five years however he started back using six months ago. Pt reported, he is linked to [facility] for counseling. Pt reported, previous inpatient admissions for detox. Per chart pt was admitted to [facility] in 2014 and 2015 for inpatient detox.   Pt presents, alert with logical/cohernet speech. Pt's eye contact was good. Pt's mood was helpless. Pt's affect was congruent with mood. Pt's thought process was coherent/reelvant. Pt's concentration was normal. Pt's insight was fair. Pt's impulse control was poor. Pt reported, if discharged from [place] he could contract for safety. Pt reported, if inpatient treatment was recommend he would sign-in voluntarily.    Diagnosis:  Alcohol Use Disorder, Severe                     Cocaine Use Disorder, Severe                     Unspecified Depressive Disorder.   Past Medical History:  Past Medical History:  Diagnosis Date  . Anxiety   . Arthritis    "everywhere" (04/21/2012)  . Cellulitis and abscess of foot 12/19/2013   LEFT FOOT  . Chronic pain   . DDD (degenerative disc disease)   . Depression   . Diabetes mellitus without  complication (HCC)    borderline  . Diabetic foot ulcer ([place])   . ETOH abuse   . GERD (gastroesophageal reflux disease)    tums  . History of blood transfusion    "related to left knee OR; probably right hip too" (04/21/2012)  . Mental disorder   . Neuromuscular disorder (HCC)    neuropathy  . Noncompliance   . Open wound    bottom of foot  . Pneumonia ~ 2012  . Polysubstance abuse    etoh, cocaine, heroin  . Stroke [facility]) 2008   "they said I might have had one during right hip replacement" (04/21/2012)    Past Surgical History:  Procedure Laterality Date  . JOINT REPLACEMENT    . KNEE ARTHROSCOPY Bilateral 1980's/1990's  . LUNG LOBECTOMY Left ~ 2006  . LUNG LOBECTOMY    . METATARSAL OSTEOTOMY  10/29/2011   Procedure: METATARSAL OSTEOTOMY;  Surgeon: [name], MD;  Location: [facility];  Service: Orthopedics;  Laterality: Left;  Left 1st Metatarsal Dorsal Closing Wedge   . REVISION TOTAL HIP ARTHROPLASTY Right 2008   "4-5 months after replacement" (04/21/2012)  . TOTAL HIP ARTHROPLASTY Right 2008  . TOTAL KNEE ARTHROPLASTY Left 2006  . TOTAL KNEE ARTHROPLASTY Right 04/20/2012  . TOTAL KNEE ARTHROPLASTY Right 04/20/2012   Procedure: TOTAL KNEE ARTHROPLASTY;  Surgeon: [name], MD;  Location: [place];  Service: Orthopedics;  Laterality: Right;  Right Total Knee Arthroplasty    Family History:  Family History  Problem Relation Age of Onset  . Diabetes Mother   . Diabetes Father     Social History:  reports that he has been smoking Cigarettes.  He has a 30.00 pack-year smoking history. He has never used smokeless tobacco. He reports that he drinks alcohol. He reports that he uses drugs, including Cocaine.  Additional Social History:  Alcohol / Drug Use Pain Medications: See MAR  Prescriptions: See MAR Over the Counter: See MAR History of alcohol / drug use?: Yes Longest period of sobriety (when/how long): Per chart, 5 yrs from 2000-2005; Pt was sober for 3 months prior to  relapse 2 weeks ago. Substance #1 Name of Substance 1: Alcohol  1 - Age of First Use: UTA 1 - Amount (size/oz): Pt reported, drinking 12 beers, 6 hours ago.  1 - Frequency: UTA 1 - Duration: UTA 1 - Last Use / Amount: Pt reported, 6 hours.  Substance #2 Name of Substance 2: Cocaine 2 - Age of First Use: UTA 2 - Amount (size/oz): Pt reported, "I don't know, 18."  2 - Frequency: UTA 2 - Duration: UTA 2 - Last Use / Amount: Pt reported, 6 hours ago.   CIWA: CIWA-Ar BP: 115/81 Pulse Rate: 74 COWS:    PATIENT STRENGTHS: (choose at least two) Average or above average intelligence Supportive family/friends  Allergies:  Allergies  Allergen Reactions  . Benadryl [Diphenhydramine Hcl] Other (See Comments)    Reaction:  Leg spasms   . Trazodone And Nefazodone Other (See Comments)    Reaction:  Leg spasms     Home Medications:  (Not in a hospital admission)  OB/GYN Status:  No LMP for male patient.  General Assessment Data Location of Assessment: [facility] TTS Assessment: In system Is this a Tele or Face-to-Face Assessment?: Tele Assessment Is this an Initial Assessment or a Re-assessment for this encounter?: Initial Assessment Marital status: Single Living Arrangements: Alone Can pt return to current living arrangement?: Yes Admission Status: Voluntary Is patient capable of signing voluntary admission?: Yes Referral Source: Self/Family/Friend Insurance type: Medicare     Crisis Care Plan Living Arrangements: Alone Legal Guardian: Other: (Self) Name of Psychiatrist: NA Name of Therapist: NA  Education Status Is patient currently in school?: No Current Grade: NA Highest grade of school patient has completed: Pt reported, 16. Name of school: NA Contact person: NA  Risk to self with the past 6 months Suicidal Ideation: No (Pt denies. ) Has patient been a risk to self within the past 6 months prior to admission? : No Suicidal Intent: No Has patient had any suicidal  intent within the past 6 months prior to admission? : No Is patient at risk for suicide?: No Suicidal Plan?: No Has patient had any suicidal plan within the past 6 months prior to admission? : No Access to Means: No What has been your use of drugs/alcohol within the last 12 months?: Alochol and cocaine.  Previous Attempts/Gestures: No How many times?: 0 Other Self Harm Risks: Pt denies.  Triggers for Past Attempts: Unknown Intentional Self Injurious Behavior: None (Pt denies. ) Family Suicide History: No Recent stressful life event(s): Other (Comment) (Pt reported, "he doesn't know." ) Persecutory voices/beliefs?: No Depression: Yes Depression Symptoms: Feeling worthless/self pity, Loss of interest in usual pleasures, Feeling angry/irritable Substance abuse history and/or treatment for substance abuse?: Yes Suicide prevention information given to non-admitted patients: Not applicable  Risk to Others within the past 6 months Homicidal Ideation: No (Pt denies. ) Does patient have any lifetime risk of violence toward others beyond the six months prior to admission? : No Thoughts of Harm to Others: No Current Homicidal Intent: No Current Homicidal Plan: No Access to Homicidal Means: No Identified Victim: NA History of harm to others?: No Assessment of Violence: None Noted Violent Behavior Description: NA Does patient have access to weapons?: No (Pt denies. ) Criminal Charges Pending?: No Does patient have a court date: No Is patient on probation?: No  Psychosis Hallucinations: None noted Delusions: None noted  Mental Status Report Appearance/Hygiene: Unremarkable Eye Contact: Good Motor Activity: Unremarkable Speech: Logical/coherent Level of Consciousness: Alert Mood: Helpless Affect: Other (Comment) (congruent with mood. ) Anxiety Level: None Thought Processes: Coherent, Relevant Judgement: Partial Orientation: Other (Comment) (day, eayr, city and state. ) Obsessive  Compulsive Thoughts/Behaviors: None  Cognitive Functioning Concentration: Normal Memory: Recent Intact IQ: Average Insight: Fair Impulse Control: Poor Appetite: Good Sleep: Decreased Total Hours of Sleep:  (Pt reported, 4-5 hours.) Vegetative Symptoms: None  ADLScreening [facility] Assessment Services) Patient's cognitive ability adequate to safely complete daily activities?: Yes Patient able to express need for assistance with ADLs?: Yes Independently performs ADLs?: Yes (appropriate for developmental age)  Prior Inpatient Therapy Prior Inpatient Therapy: Yes Prior Therapy Dates: 2014, 2015 Prior Therapy Facilty/Provider(s): [facility] Reason for Treatment: Detox.   Prior Outpatient Therapy Prior Outpatient Therapy: Yes Prior Therapy Dates: Current Prior Therapy Facilty/Provider(s): [facility] Reason for Treatment: Counseling.  Does patient have an ACCT team?: No Does patient have Intensive In-House Services?  : No Does patient have [facility] services? : No Does patient have P4CC services?: No  ADL Screening (condition at time of admission) Patient's cognitive ability adequate to safely complete daily activities?: Yes Is the patient deaf or have difficulty hearing?: No Does the patient have difficulty seeing, even when wearing glasses/contacts?: Yes Does the patient have difficulty concentrating, remembering, or making decisions?: No Patient able to express need for assistance with ADLs?: Yes Does the patient have difficulty dressing or bathing?: No Independently performs ADLs?: Yes (appropriate for developmental age) Does the patient have difficulty walking or climbing stairs?: Yes (Pt reported, having both knees and his right hip replaced. ) Weakness of Legs: Both (Pt reported, having both knees and his right hip replaced. ) Weakness of Arms/Hands: None       Abuse/Neglect Assessment (Assessment to be complete while patient is alone) Physical Abuse: Denies (Pt  denies. ) Verbal Abuse: Denies (Pt denies. ) Sexual Abuse: Denies (Pt denies. ) Exploitation of patient/patient's resources: Denies (Pt denies. ) Self-Neglect: Denies (Pt denies.)     Advance Directives (For Healthcare) Does Patient Have a Medical Advance Directive?: No    Additional Information 1:1 In Past 12 Months?: No CIRT Risk: No Elopement Risk: No Does patient have medical clearance?: No     Disposition: Lindon Romp, NP recommends discharge with substance use/detox resources. Disposition was discussed with Dr. Tomi Bamberger who is in agreement and Otila Kluver, RN.    Disposition Initial Assessment Completed for this Encounter: Yes Disposition of Patient: Other dispositions (Discharged with OPT resoruces. ) Other disposition(s): Other (Comment) (Discharged with OPT resources.)  This service was provided via telemedicine using a 2-way, interactive audio and Radiographer, therapeutic.  Names of all persons participating in this telemedicine service and their role in this encounter. None              Vertell Novak 09/19/2016  2:59 AM   Vertell Novak, MS, [facility], [facility] Triage Specialist [phone]

## 2016-09-19 NOTE — ED Notes (Signed)
Pt given papers for substance abuse resources and encouraged to call; pt requesting to use phone, pt informed phone in waiting room; pt seen leaving the department with steady gait

## 2016-09-19 NOTE — ED Provider Notes (Signed)
[place] DEPT Provider Note   CSN: [id number] Arrival date & time: 09/18/16  2323  Time seen 23:45 PM   History   Chief Complaint Chief Complaint  Patient presents with  . Detox    HPI [name] is a 51 y.o. male.  HPI  patient has a history of substance abuse. When asked why he is here tonight he states "too much partying for the past 30 years". He states his last detox was about 3 years ago. He states he was sober for the past 3 years. He states he has been sober as long as 5 years before. He told me initially he started using 2 months ago however later on he said he been using for about 6 months. He states he is abusing cocaine and alcohol. He states he is drinking about a 12 pack a day and he is using cocaine daily. He also states he is on probation for a DUI 3 years ago which went into December. However he states he has had 2 positive drug screens with his probation officer, the last was August 22. He states he had been sober for 2 weeks before that drug test and was surprised it was positive. He states tonight he last drank and used cocaine about 7:30 PM. At 8 PM he states he decided he needed to get sober. [facility], he denies being depressed tonight. He denies suicidal or homicidal ideation.  Patient was seen in the ED on August 17 after a overdose on narcotics, it was not suicidal intent.  Patient has been admitted to behavioral health in the past and to [facility] [place] in the past. He states "I need to be locked up for 5 or 6 days for detox". Patient also states "I need something to sleep".  PCP Lucia Gaskins, MD   Past Medical History:  Diagnosis Date  . Anxiety   . Arthritis    "everywhere" (04/21/2012)  . Cellulitis and abscess of foot 12/19/2013   LEFT FOOT  . Chronic pain   . DDD (degenerative disc disease)   . Depression   . Diabetes mellitus without complication (HCC)    borderline  . Diabetic foot  ulcer ([place])   . ETOH abuse   . GERD (gastroesophageal reflux disease)    tums  . History of blood transfusion    "related to left knee OR; probably right hip too" (04/21/2012)  . Mental disorder   . Neuromuscular disorder (HCC)    neuropathy  . Noncompliance   . Open wound    bottom of foot  . Pneumonia ~ 2012  . Polysubstance abuse    etoh, cocaine, heroin  . Stroke [facility]) 2008   "they said I might have had one during right hip replacement" (04/21/2012)    Patient Active Problem List   Diagnosis Date Noted  . SVT (supraventricular tachycardia) ([place]) 08/17/2016  . Tobacco use disorder 10/25/2014  . Diabetes mellitus type 2 in nonobese (HCC)   . Swelling   . Type 2 diabetes mellitus with left diabetic foot ulcer ([place]) 12/18/2013  . Diabetes mellitus due to underlying condition with foot ulcer ([place]) 12/18/2013  . Left leg cellulitis 12/18/2013  . Cellulitis of right lower extremity 07/19/2013  . Cocaine abuse 03/29/2012  . Generalized anxiety disorder 03/29/2012  . Panic attacks 03/29/2012  . Alcohol dependence ([place]) 08/05/2011    Class: Chronic  . Substance abuse 05/31/2011  . Foot ulcer, left (  [place]) 02/15/2011  . Fasting hyperglycemia 02/15/2011  . Neuropathic ulcer of foot ([place]) 02/15/2011    Past Surgical History:  Procedure Laterality Date  . JOINT REPLACEMENT    . KNEE ARTHROSCOPY Bilateral 1980's/1990's  . LUNG LOBECTOMY Left ~ 2006  . LUNG LOBECTOMY    . METATARSAL OSTEOTOMY  10/29/2011   Procedure: METATARSAL OSTEOTOMY;  Surgeon: [name], MD;  Location: [place];  Service: Orthopedics;  Laterality: Left;  Left 1st Metatarsal Dorsal Closing Wedge   . REVISION TOTAL HIP ARTHROPLASTY Right 2008   "4-5 months after replacement" (04/21/2012)  . TOTAL HIP ARTHROPLASTY Right 2008  . TOTAL KNEE ARTHROPLASTY Left 2006  . TOTAL KNEE ARTHROPLASTY Right 04/20/2012  . TOTAL KNEE ARTHROPLASTY Right 04/20/2012   Procedure: TOTAL KNEE ARTHROPLASTY;  Surgeon: [name], MD;   Location: [place];  Service: Orthopedics;  Laterality: Right;  Right Total Knee Arthroplasty       Home Medications    Prior to Admission medications   Medication Sig Start Date End Date Taking? Authorizing Provider  blood glucose meter kit and supplies KIT Dispense based on patient and insurance preference. Use up to four times daily as directed. (FOR ICD-9 250.00, 250.01). 08/18/16   Orvan Falconer, MD  diltiazem (CARDIZEM) 30 MG tablet Take 1 tablet (30 mg total) by mouth 2 (two) times daily. 09/10/16   Lendon Colonel, NP  escitalopram (LEXAPRO) 20 MG tablet Take 20 mg by mouth daily.     [provider]  gabapentin (NEURONTIN) 400 MG capsule Take 1 capsule (400 mg total) by mouth 4 (four) times daily. For substance withdrawal syndrome Patient taking differently: Take 400 mg by mouth 3 (three) times daily. For substance withdrawal syndrome 03/29/13   Lindell Spar I, NP  metFORMIN (GLUCOPHAGE) 500 MG tablet Take 1 tablet (500 mg total) by mouth 2 (two) times daily with a meal. 01/15/14   Philemon Kingdom, MD  mirtazapine (REMERON) 30 MG tablet Take 30 mg by mouth at bedtime.    [provider]  QUEtiapine (SEROQUEL) 200 MG tablet Take 1 tablet (200 mg total) by mouth at bedtime. For mood control 03/29/13   Lindell Spar I, NP  thiamine 100 MG tablet Take 1 tablet (100 mg total) by mouth daily. 08/19/16   Orvan Falconer, MD    Family History Family History  Problem Relation Age of Onset  . Diabetes Mother   . Diabetes Father     Social History Social History  Substance Use Topics  . Smoking status: Current Every Day Smoker    Packs/day: 1.00    Years: 30.00    Types: Cigarettes  . Smokeless tobacco: Never Used  . Alcohol use 0.0 oz/week  States he's on disability because he has had bilateral knee replacements and a hip replacement.   Allergies   Benadryl [diphenhydramine hcl] and Trazodone and nefazodone   Review of Systems Review of Systems  All other systems  reviewed and are negative.    Physical Exam Updated Vital Signs BP 115/81 (BP Location: Right Arm)   Pulse 74   Temp 98.1 F (36.7 C) (Oral)   Resp 17   Ht _0  (1.905 m)   Wt 93 kg (205 lb)   SpO2 92%   BMI 25.62 kg/m   Vital signs normal    Physical Exam  Constitutional: He is oriented to person, place, and time. He appears well-developed and well-nourished.  Non-toxic appearance. He does not appear ill. No distress.  Appears  to be under the influence of something.  HENT:  Head: Normocephalic and atraumatic.  Right Ear: External ear normal.  Left Ear: External ear normal.  Nose: Nose normal. No mucosal edema or rhinorrhea.  Mouth/Throat: Oropharynx is clear and moist and mucous membranes are normal. No dental abscesses or uvula swelling.  Eyes: Pupils are equal, round, and reactive to light. Conjunctivae and EOM are normal.  Neck: Normal range of motion and full passive range of motion without pain. Neck supple.  Cardiovascular: Normal rate, regular rhythm and normal heart sounds.  Exam reveals no gallop and no friction rub.   No murmur heard. Pulmonary/Chest: Effort normal and breath sounds normal. No respiratory distress. He has no wheezes. He has no rhonchi. He has no rales. He exhibits no tenderness and no crepitus.  Abdominal: Soft. Normal appearance and bowel sounds are normal. He exhibits no distension. There is no tenderness. There is no rebound and no guarding.  Musculoskeletal: Normal range of motion. He exhibits no edema or tenderness.  Moves all extremities well.   Neurological: He is alert and oriented to person, place, and time. He has normal strength. No cranial nerve deficit.  Skin: Skin is warm, dry and intact. No rash noted. No erythema. There is pallor.  Psychiatric: He has a normal mood and affect. His behavior is normal. His mood appears not anxious. His speech is slurred.  Nursing note and vitals reviewed.    ED Treatments / Results  Labs (all  labs ordered are listed, but only abnormal results are displayed) Results for orders placed or performed during the hospital encounter of 09/18/16  Comprehensive metabolic panel  Result Value Ref Range   Sodium 135 135 - 145 mmol/L   Potassium 3.5 3.5 - 5.1 mmol/L   Chloride 100 (L) 101 - 111 mmol/L   CO2 22 22 - 32 mmol/L   Glucose, Bld 205 (H) 65 - 99 mg/dL   BUN 7 6 - 20 mg/dL   Creatinine, Ser 0.79 0.61 - 1.24 mg/dL   Calcium 8.5 (L) 8.9 - 10.3 mg/dL   Total Protein 6.7 6.5 - 8.1 g/dL   Albumin 3.7 3.5 - 5.0 g/dL   AST 19 15 - 41 U/L   ALT 15 (L) 17 - 63 U/L   Alkaline Phosphatase 93 38 - 126 U/L   Total Bilirubin 0.5 0.3 - 1.2 mg/dL   GFR calc non Af Amer >60 >60 mL/min   GFR calc Af Amer >60 >60 mL/min   Anion gap 13 5 - 15  Ethanol  Result Value Ref Range   Alcohol, Ethyl (B) 165 (H) <5 mg/dL  CBC with Differential  Result Value Ref Range   WBC 11.0 (H) 4.0 - 10.5 K/uL   RBC 5.39 4.22 - 5.81 MIL/uL   Hemoglobin 15.5 13.0 - 17.0 g/dL   HCT 45.9 39.0 - 52.0 %   MCV 85.2 78.0 - 100.0 fL   MCH 28.8 26.0 - 34.0 pg   MCHC 33.8 30.0 - 36.0 g/dL   RDW 14.7 11.5 - 15.5 %   Platelets 176 150 - 400 K/uL   Neutrophils Relative % 59 %   Neutro Abs 6.5 1.7 - 7.7 K/uL   Lymphocytes Relative 36 %   Lymphs Abs 3.9 0.7 - 4.0 K/uL   Monocytes Relative 4 %   Monocytes Absolute 0.4 0.1 - 1.0 K/uL   Eosinophils Relative 2 %   Eosinophils Absolute 0.2 0.0 - 0.7 K/uL   Basophils Relative 1 %  Basophils Absolute 0.1 0.0 - 0.1 K/uL  Urine rapid drug screen (hosp performed)  Result Value Ref Range   Opiates NONE DETECTED NONE DETECTED   Cocaine POSITIVE (A) NONE DETECTED   Benzodiazepines NONE DETECTED NONE DETECTED   Amphetamines NONE DETECTED NONE DETECTED   Tetrahydrocannabinol NONE DETECTED NONE DETECTED   Barbiturates NONE DETECTED NONE DETECTED   Laboratory interpretation all normal except leukocytosis, alcohol intoxication, hyperglycemia    EKG  EKG  Interpretation None       Radiology No results found.  Procedures Procedures (including critical care time)  Medications Ordered in ED Medications - No data to display   Initial Impression / Assessment and Plan / ED Course  I have reviewed the triage vital signs and the nursing notes.  Pertinent labs & imaging results that were available during my care of the patient were reviewed by me and considered in my medical decision making (see chart for details).      Patient is insisting he needs inpatient treatment. Although I do not think he meets criteria, TTS consult was ordered once his initial blood work returned.  2:14 AM Lytle Michaels, TTS, has evaluated patient and agrees patient does not meet inpatient criteria. She is going to fax over outpatient referral for the patient.  Review of the [place] shows patient got #90 oxycodone 10 mg tablets from his PCP on August 14, however before that he had only gotten small amounts of tramadol in May from our ED and Percocet in March from our ED he got #120 clonazepam 1 mg tablets on August 16, and gets them on a monthly basis prescribed by a psychiatrist Dr. [name] Mimes  Final Clinical Impressions(s) / ED Diagnoses   Final diagnoses:  Alcohol abuse  Cocaine abuse    Plan discharge  Rolland Porter, MD, Barbette Or, MD 09/19/16 [phone]

## 2016-09-19 NOTE — [facility] Counselor (Signed)
Clinician faxed substance use/detox resources to [facility] for patient.   Vertell Novak, MS, [facility], [facility] Triage Specialist [phone]

## 2016-09-19 NOTE — Discharge Instructions (Signed)
Unfortunately you do not meet criteria tonight to be admitted emergently to an inpatient facility for detox or rehab. You will need to follow-up with the outpatient resources that you were given to arrange to be in a detox or rehabilitation facility.

## 2016-10-14 IMAGING — MR MR ANKLE*L* WO/W CM
5 of 9 series · 19 of 40 positions shown · IV contrast (multihance)
Comparison: 12/18/2013.

CLINICAL DATA: LEFT ankle pain for 4 months. Redness and swelling
in the malleoli. Subsequent encounter.

EXAM:
MRI OF THE LEFT ANKLE WITHOUT AND WITH CONTRAST
TECHNIQUE: Multiplanar, multisequence MR imaging of the ankle was performed
before and after the administration of intravenous contrast.
CONTRAST:  19mL MULTIHANCE GADOBENATE DIMEGLUMINE 529 MG/ML IV SOLN

[Series 10: t2fs axial · axial · 3.0mm · 0.31mm/px · z∈[-46,+106]mm · 5 of 44 slices shown]
[im 1/44]
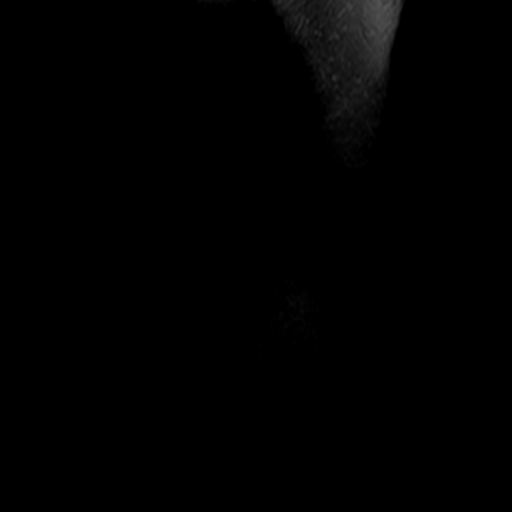
[im 11/44]
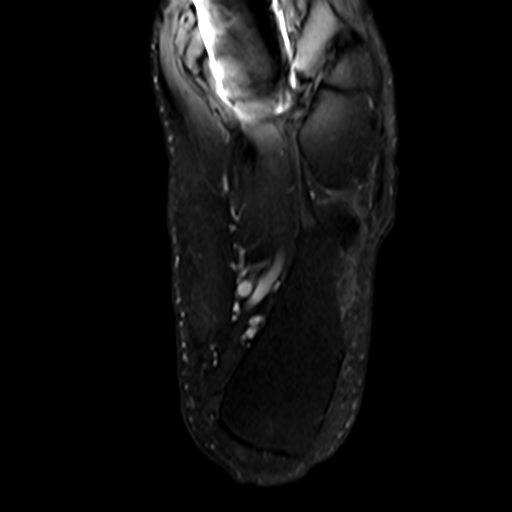
[im 22/44]
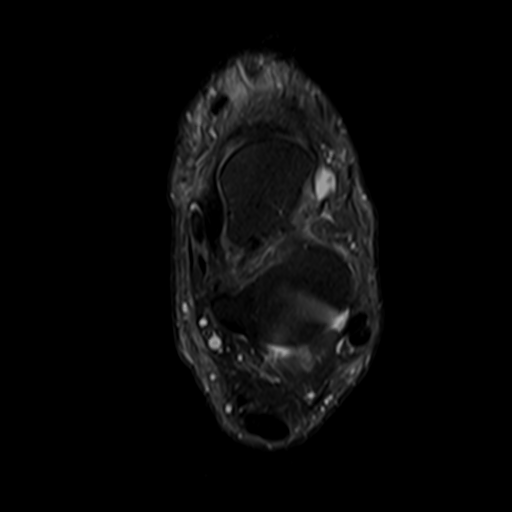
[im 33/44]
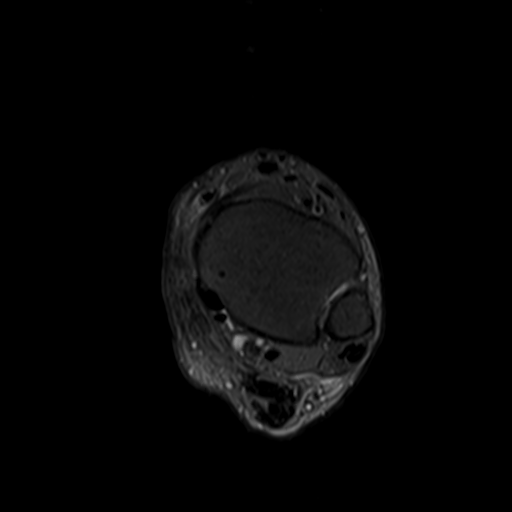
[im 44/44]
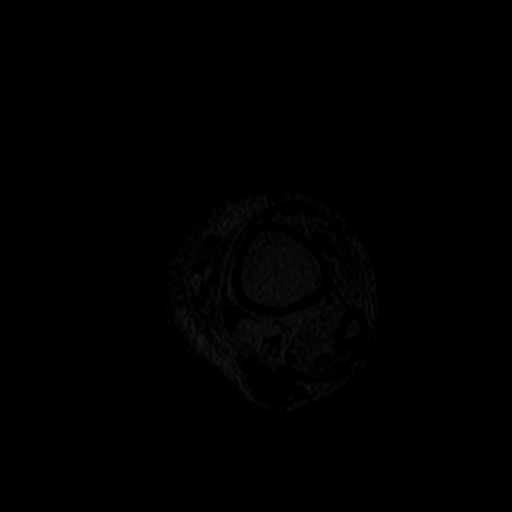

[Series 11: T1 · axial · 3.0mm · 0.31mm/px · z∈[-46,+106]mm · 5 of 44 slices shown (1 of 3)]
[im 1/44]
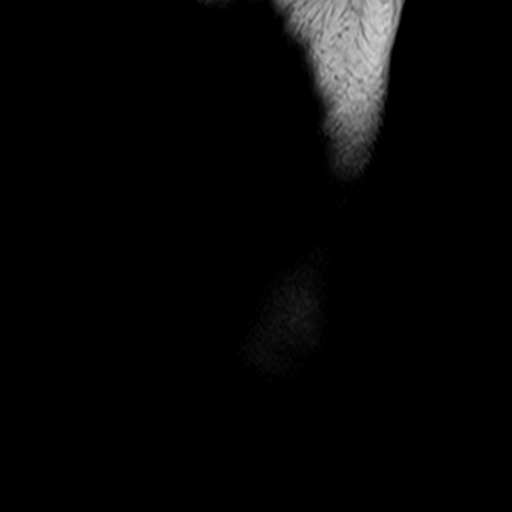
[im 11/44]
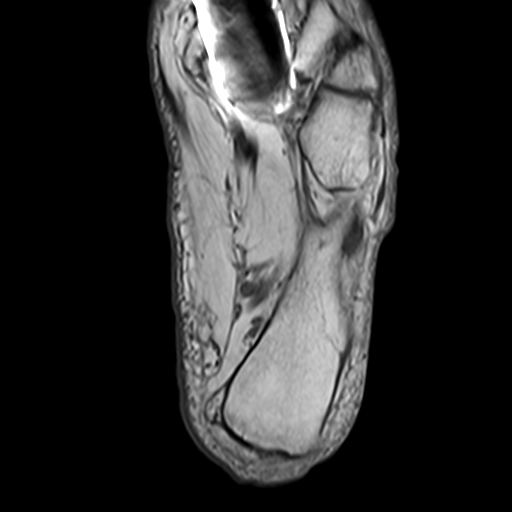
[im 22/44]
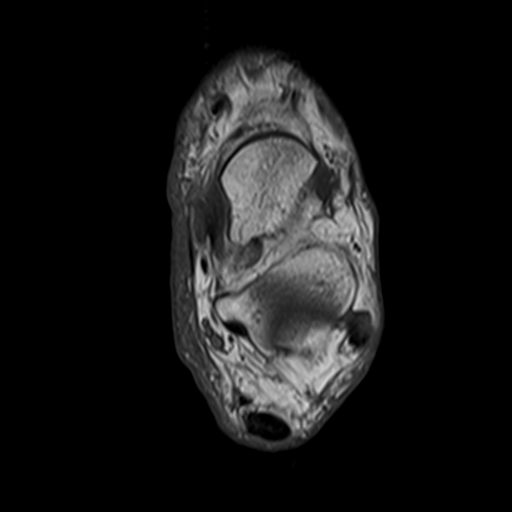
[im 33/44]
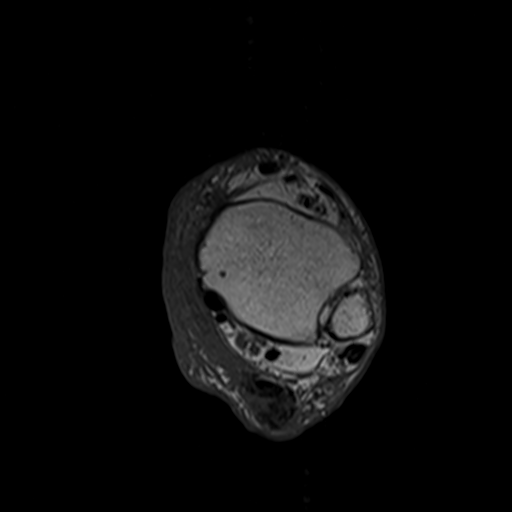
[im 44/44]
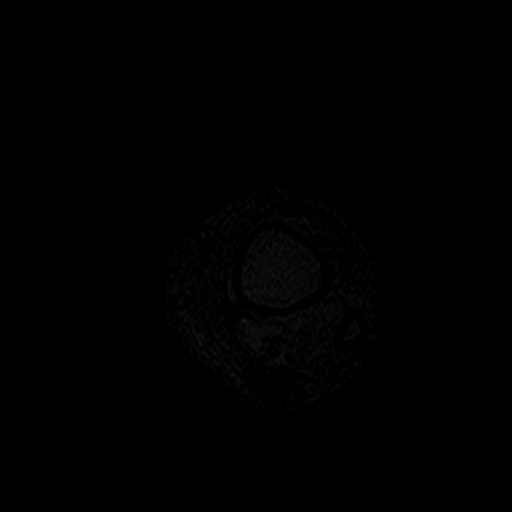

[Series 12: t1fs axial · axial · 3.0mm · 0.31mm/px · z∈[-46,-11]mm · 2 of 44 slices shown]
[im 1/44]
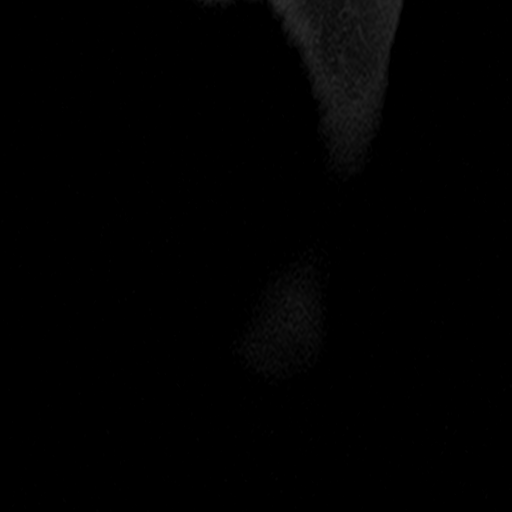
[im 11/44]
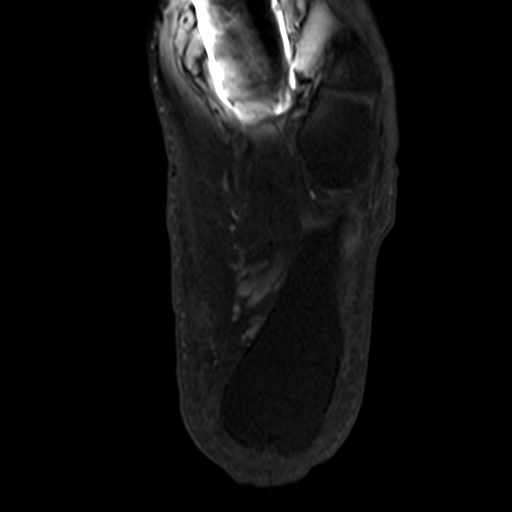

[Series 13: T1 · coronal · 3.0mm · 0.34mm/px · 4 of 36 slices shown (2 of 3)]
[im 1/36]
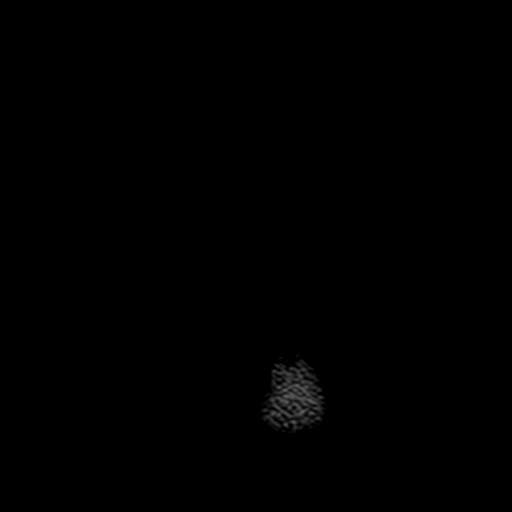
[im 12/36]
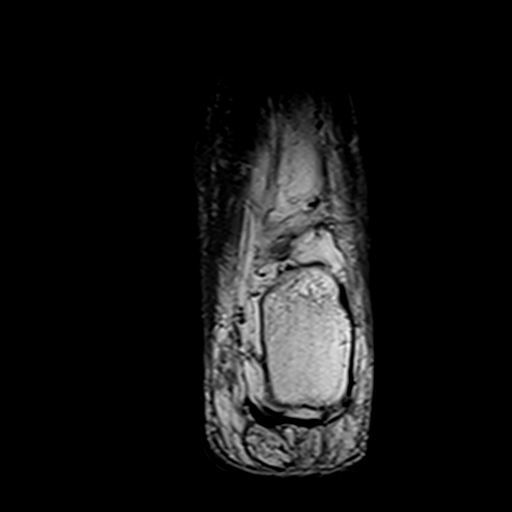
[im 24/36]
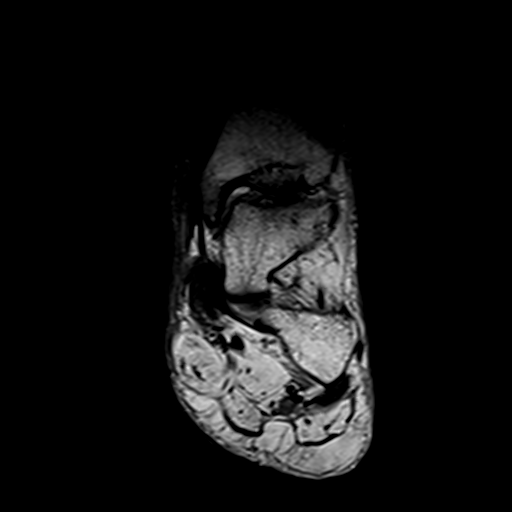
[im 36/36]
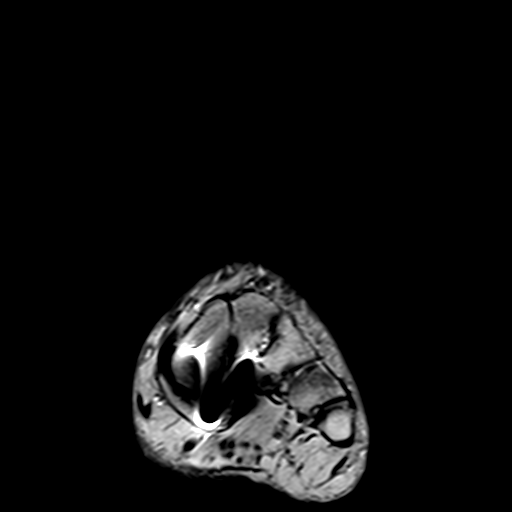

[Series 15: T1 · sagittal · 3.0mm · 0.34mm/px · 3 of 24 slices shown (3 of 3)]
[im 1/24]
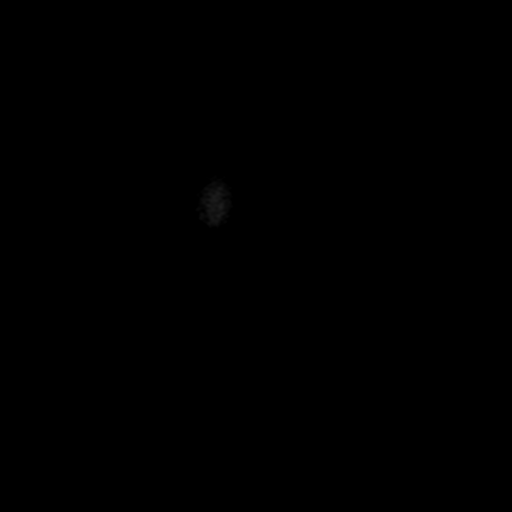
[im 12/24]
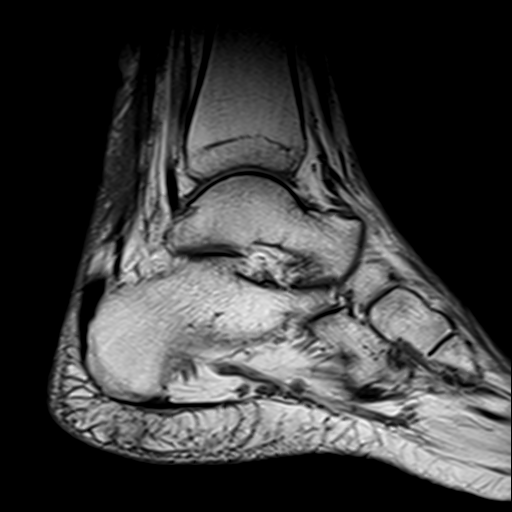
[im 24/24]
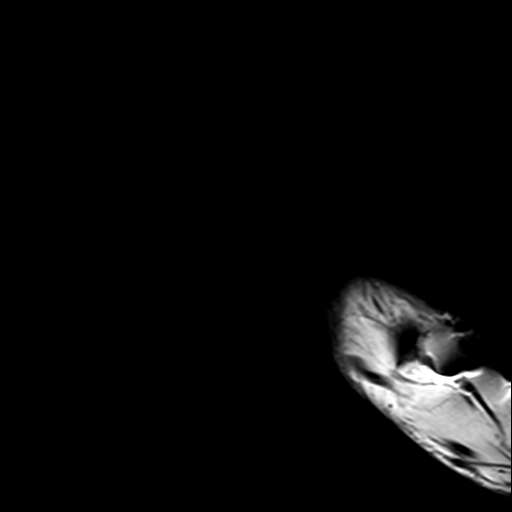

[19 of 40 positions shown; findings below may reference images not displayed]

FINDINGS: Diffuse edema is present in the posterior distal leg. This is more
prominent around the medial malleolus and there is nonenhancing
likely necrotic tissue just proximal to the posterior medial
malleolus (image 14 series 16). At the cranial extent of this area
of necrotic tissue, there is a tiny subcutaneous abscess with
peripheral enhancement that measures 6 mm transverse and 10 mm AP.
Craniocaudal, this measures 14 mm (image 19 series 17).

TENDONS

Peroneal: Intact.

Posteromedial: Intact.

Anterior: Intact.

Achilles: Chronic distal Achilles tendinosis is present with
swelling and high signal in the distal fibers. No tear or
retraction.

Plantar Fascia: Normal.

LIGAMENTS

Lateral: Intact.

Medial: Intact.

CARTILAGE

Ankle Joint: Small ankle effusion is present, probably reactive or
degenerative.

Subtalar Joints/Sinus Tarsi: Within normal limits.

Bones: There is no osteomyelitis.  Normal marrow signal.
IMPRESSION: IMPRESSION
1. Diffuse edema in the medial distal leg and ankle, most compatible
with cellulitis in the appropriate clinical setting. Small abscess
with nonenhancing likely necrotic tissue inferiorly in the medial
distal leg.
2. Chronic Achilles tendinosis.
3. Tiny ankle effusion, likely degenerative or reactive.
4. Negative for osteomyelitis.

## 2016-11-22 IMAGING — CR DG FOOT COMPLETE 3+V*L*
3 series · 3 of 3 positions shown · non-contrast
Comparison: 12/18/2013

CLINICAL DATA: Left ankle and left foot pain, twisted left ankle
yesterday, diabetic neuropathy

EXAM:
LEFT FOOT - COMPLETE 3+ VIEW

[view not recorded (1 of 3)]
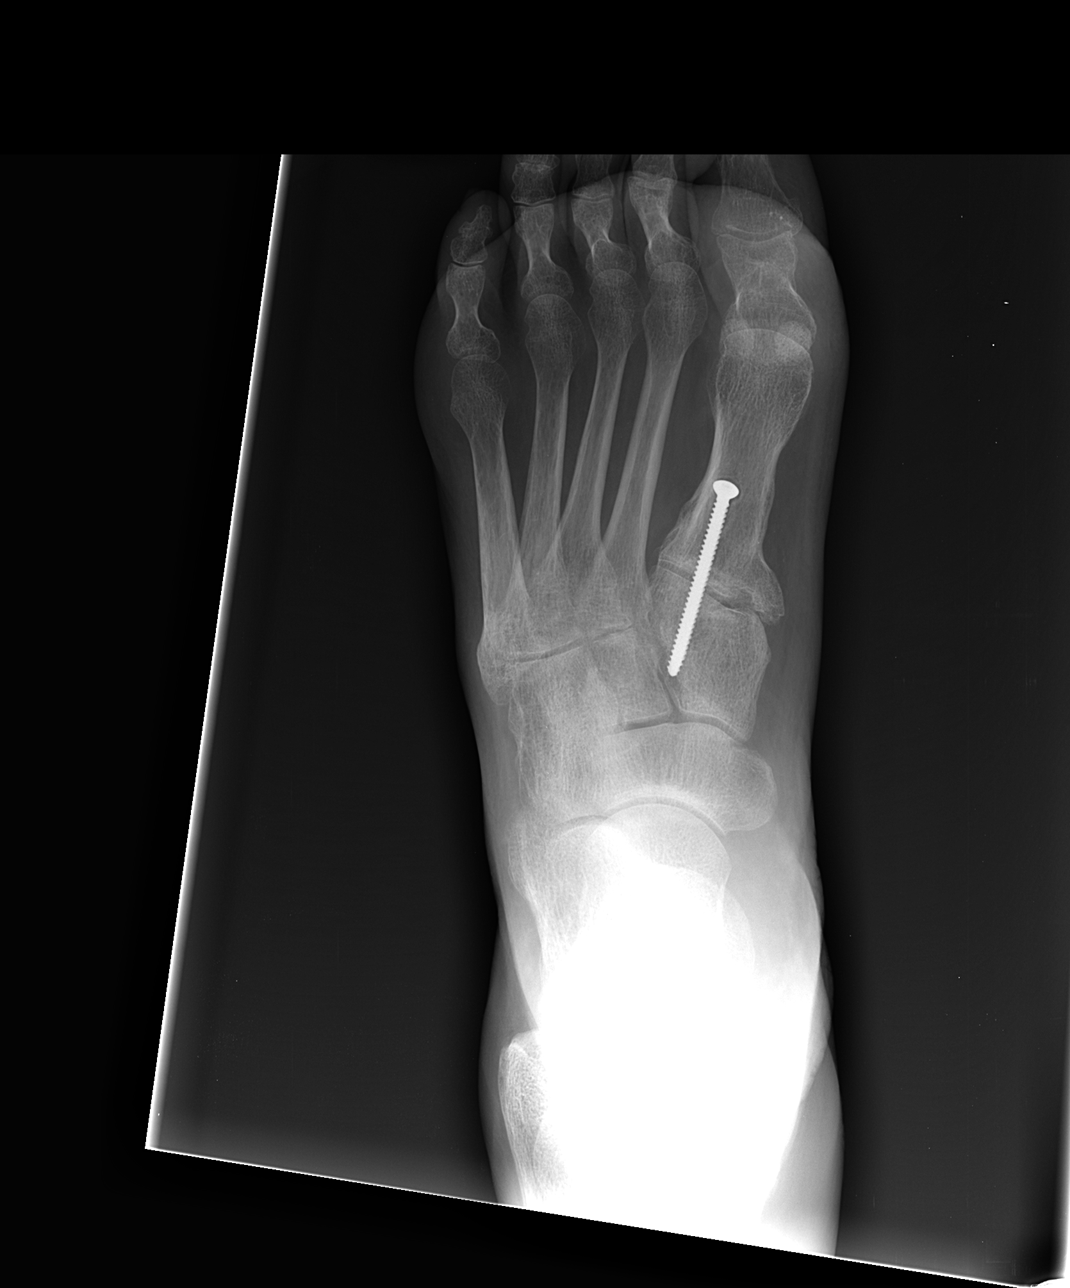

[view not recorded (2 of 3)]
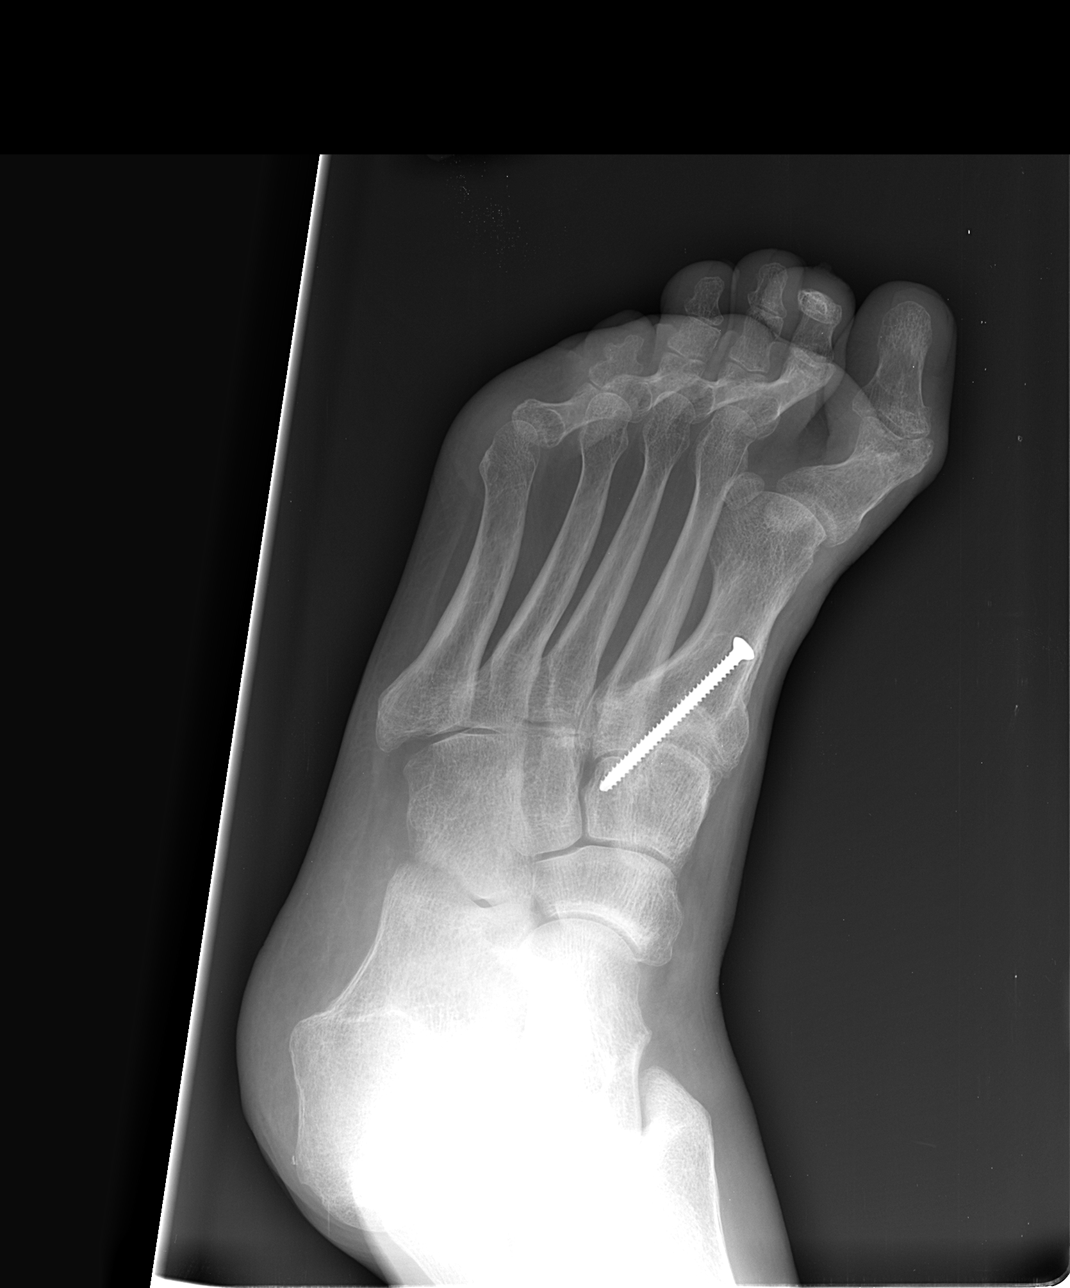

[view not recorded (3 of 3)]
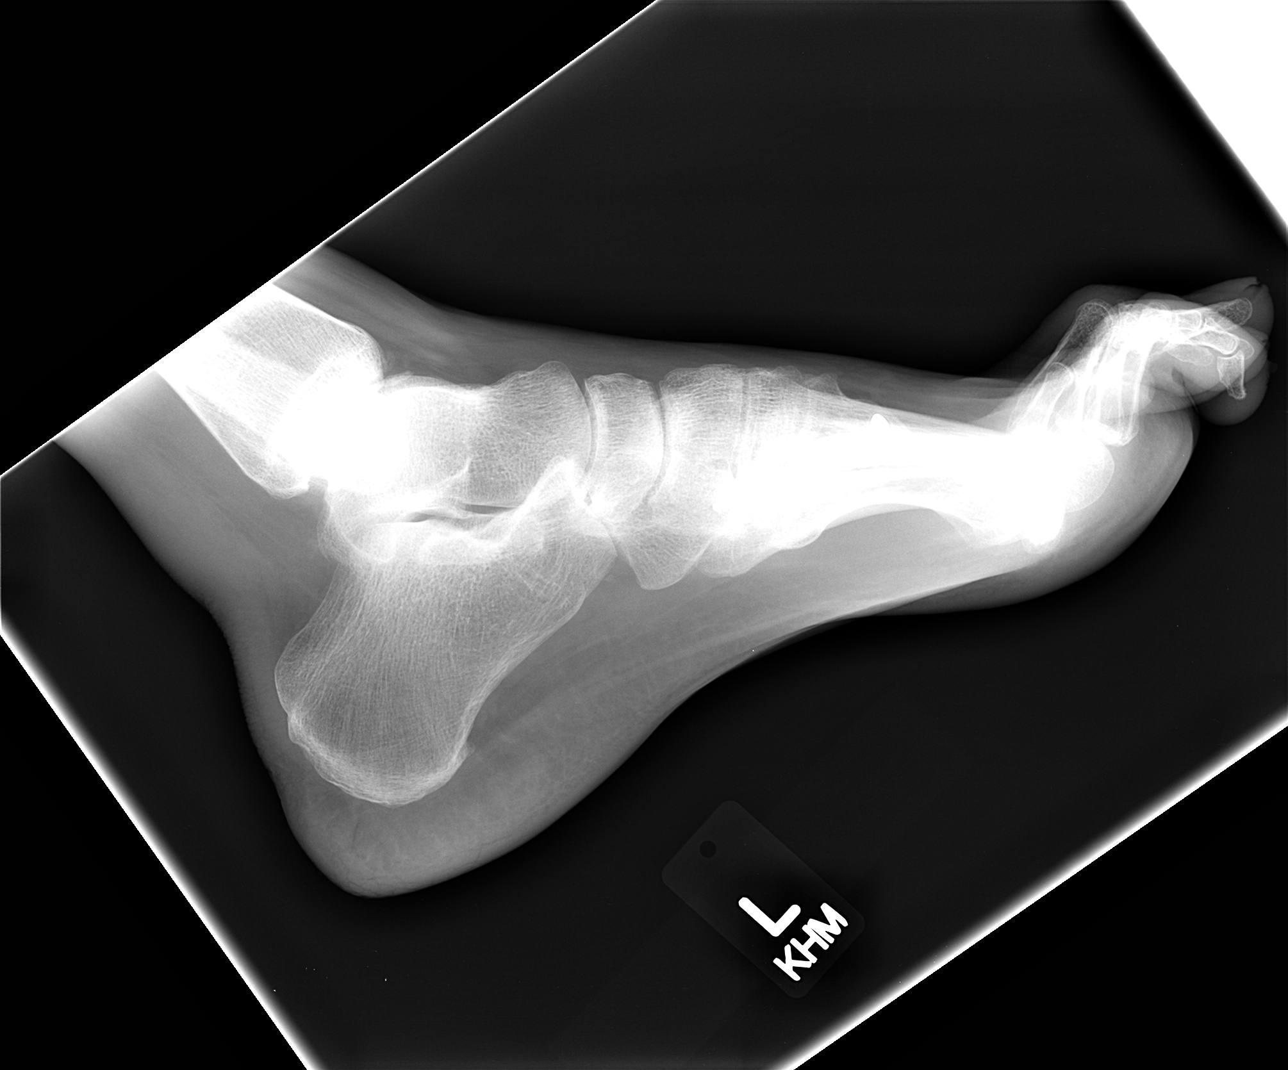

[3 of 3 positions shown; findings below may reference images not displayed]

FINDINGS: Three views of the left foot submitted. Diffuse osteopenia again
noted. No acute fracture or subluxation. Again noted prior metallic
fusion of first tarsal metatarsal joint. Mild dorsal spurring tarsal
region. Mild degenerative changes interphalangeal joint great toe.
IMPRESSION: No acute fracture or subluxation. Stable postsurgical changes and
mild degenerative changes. Diffuse osteopenia.

## 2016-12-07 IMAGING — DX DG ANKLE COMPLETE 3+V*L*
3 series · 3 of 3 positions shown · non-contrast
Comparison: None.

CLINICAL DATA: Initial evaluation left ankle pain, twisted his
ankle yesterday with pain and swelling

EXAM:
LEFT ANKLE COMPLETE - 3+ VIEW

[ankle ap]
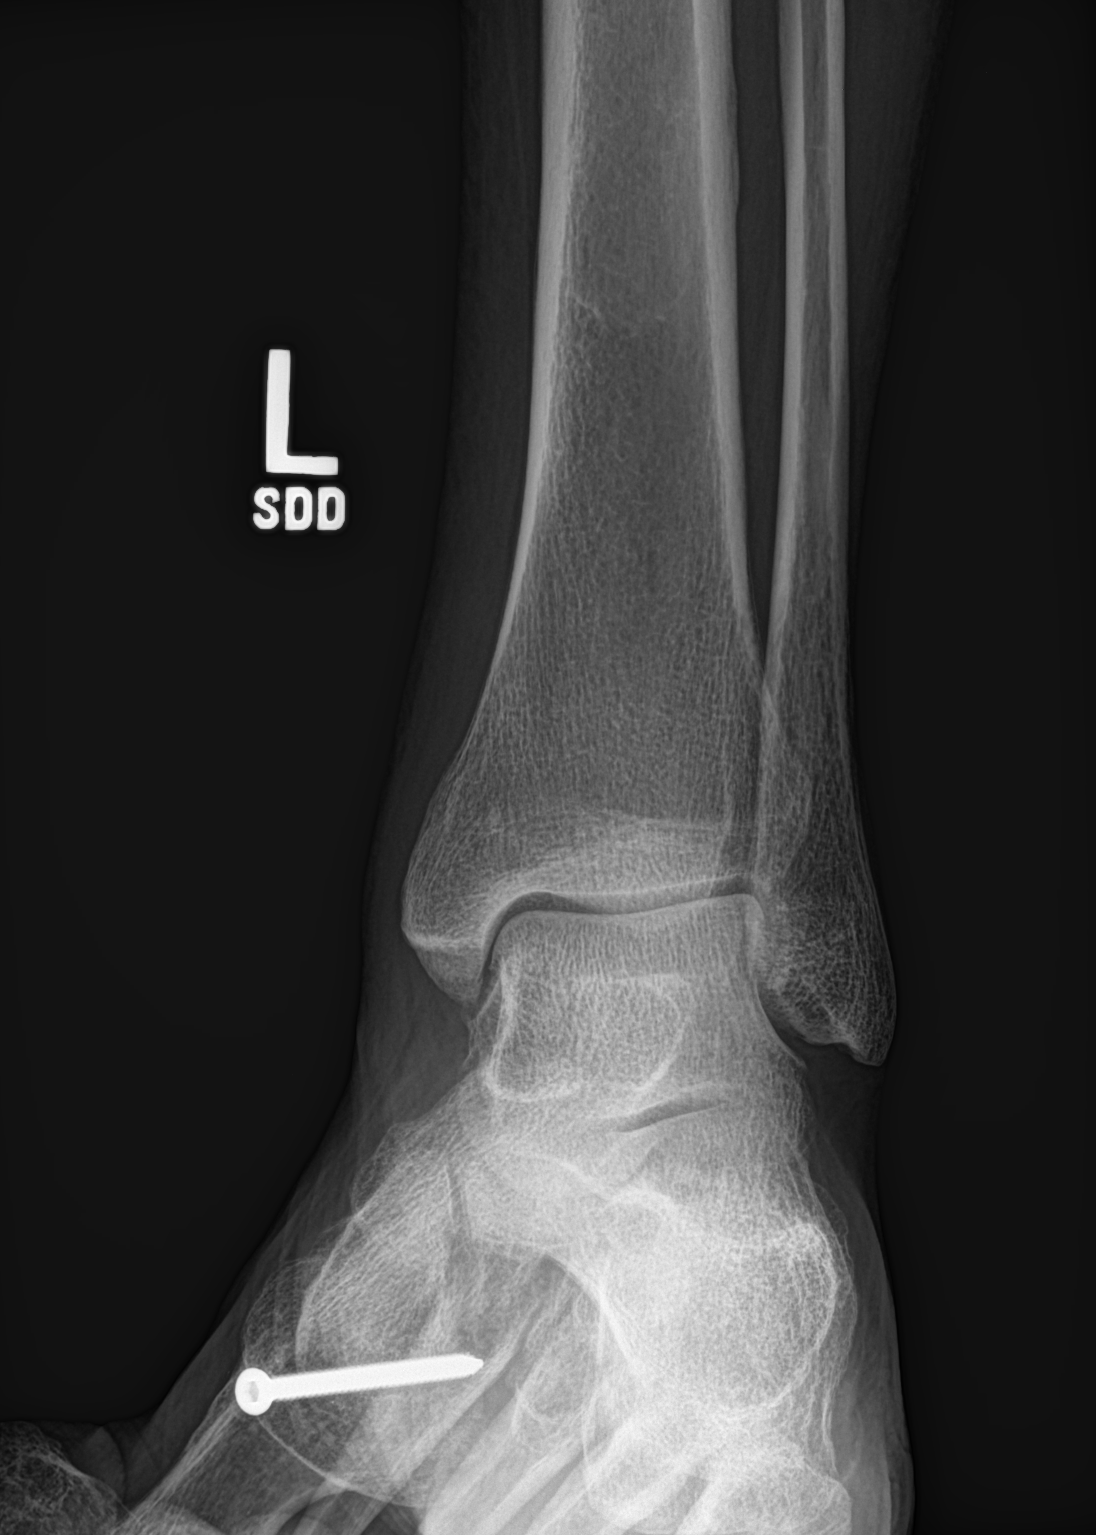

[ankle obl]
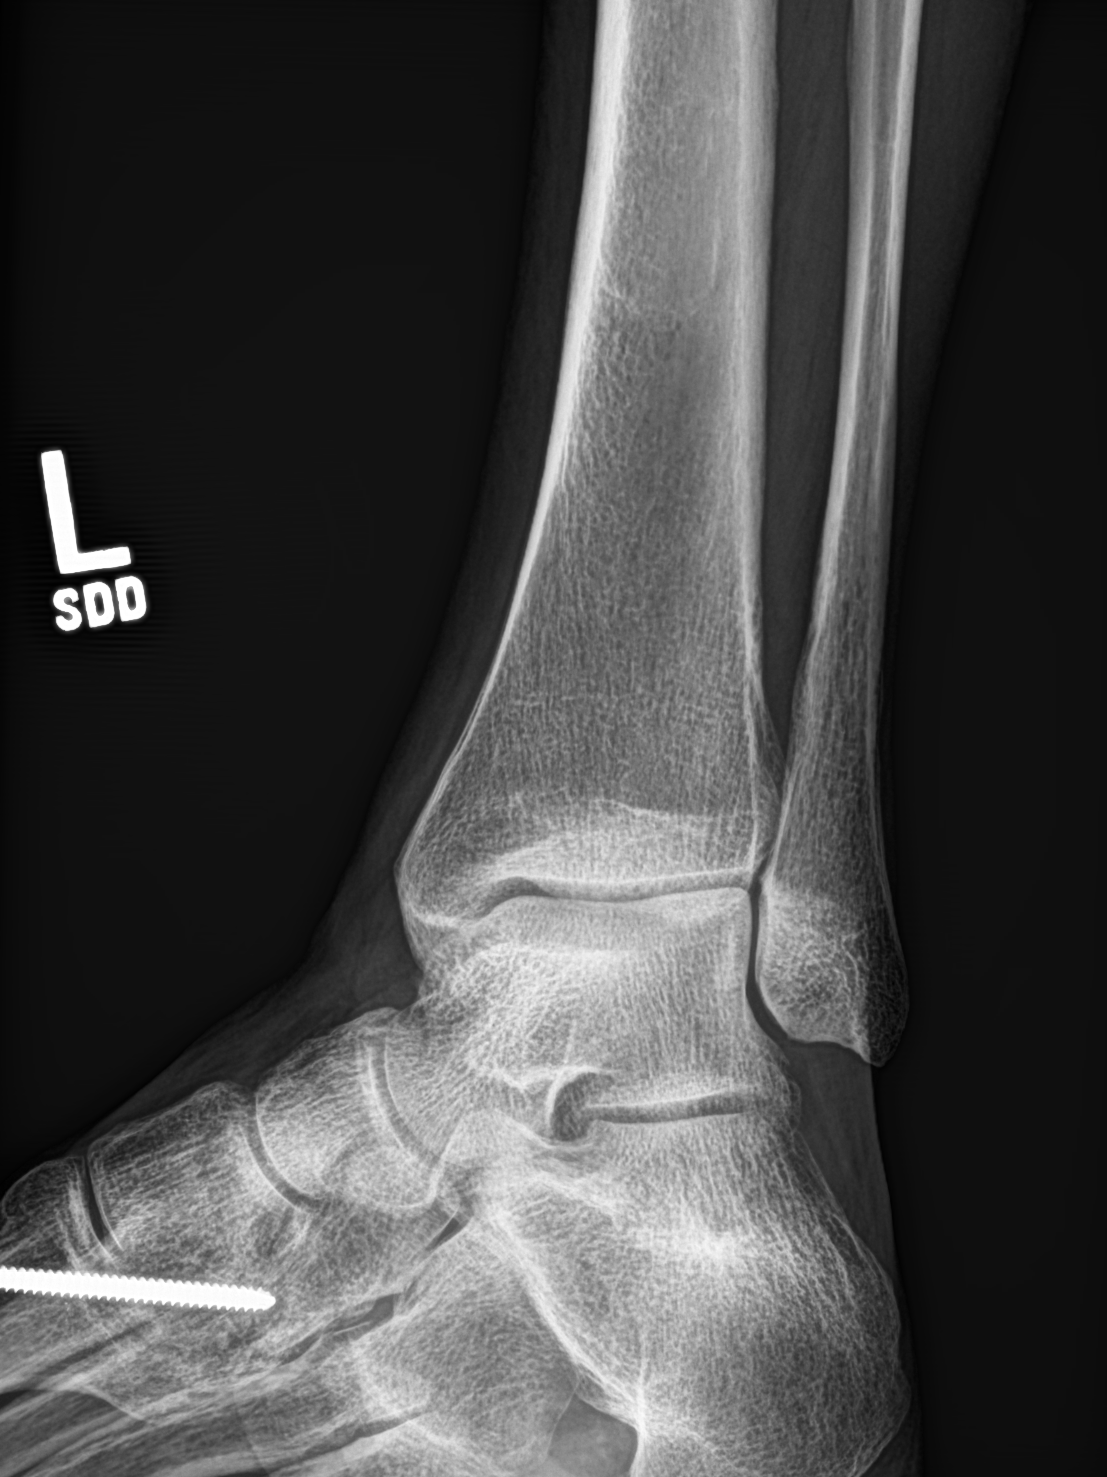

[ankle lat]
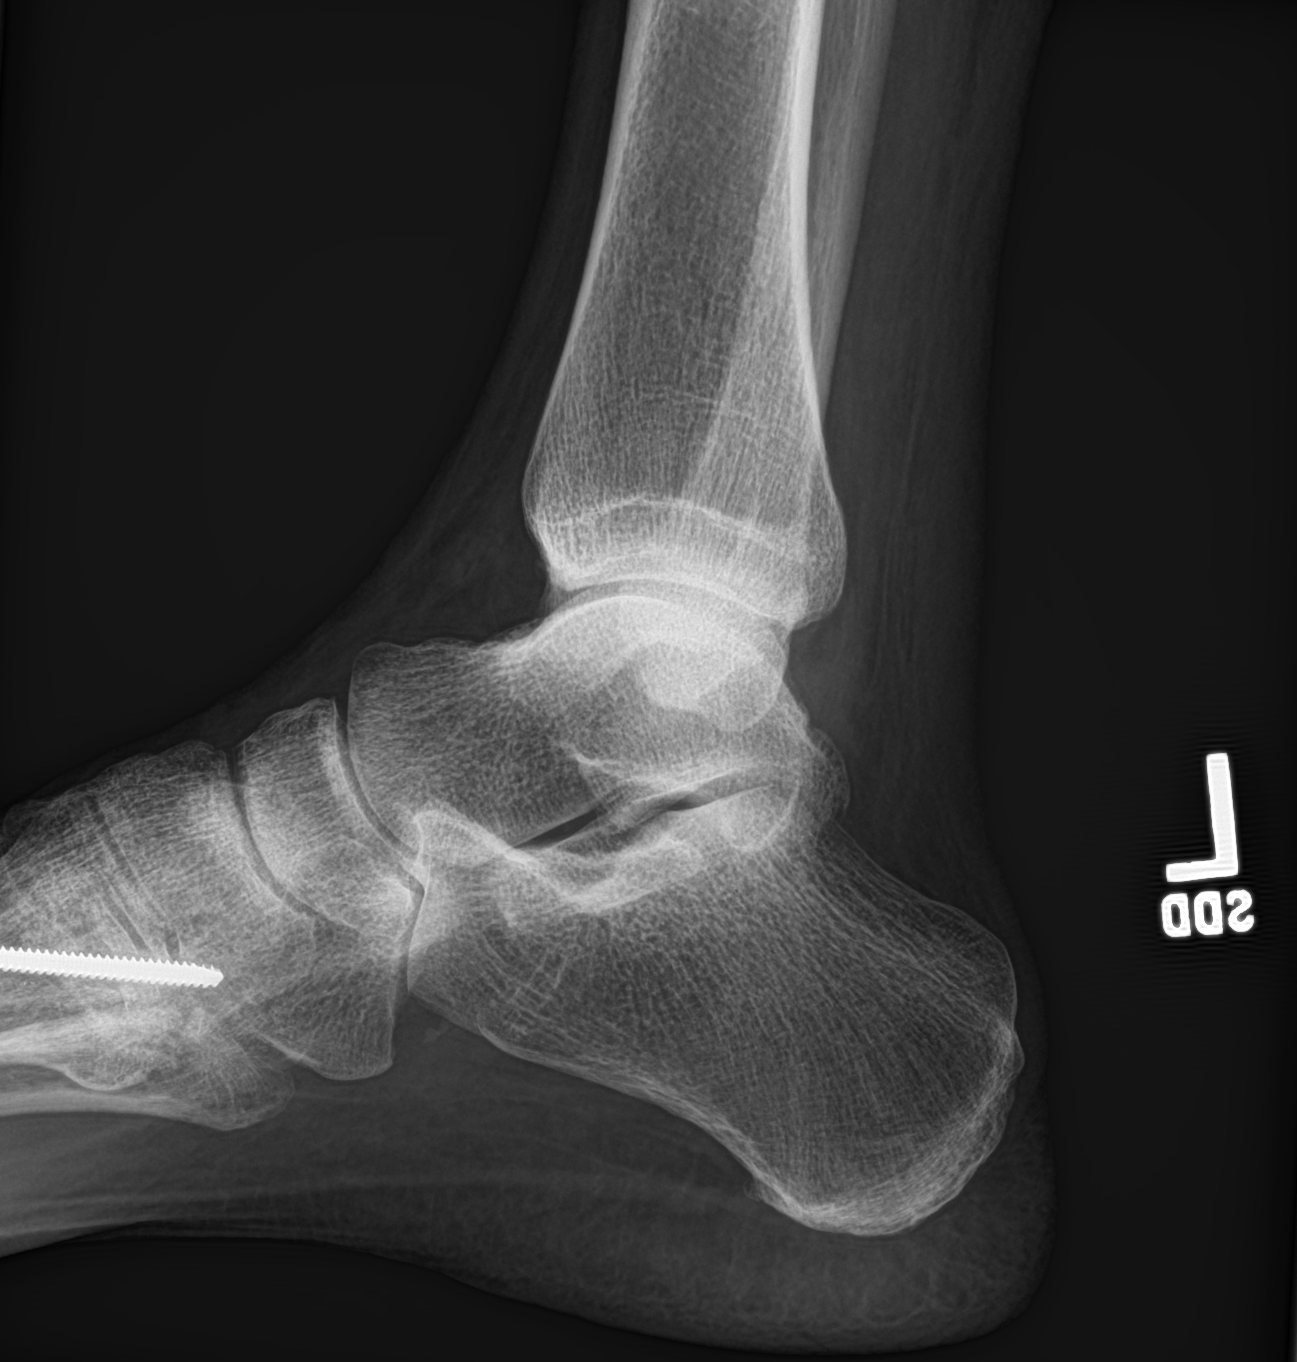

[3 of 3 positions shown; findings below may reference images not displayed]

FINDINGS: Orthopedic screw across the first tarsal metatarsal joint. Ankle
mortise is intact with no evidence of acute fracture. Mild medial
soft tissue swelling. Minimal joint effusion.
IMPRESSION: Findings suggest mild sprain.

## 2016-12-14 ENCOUNTER — Other Ambulatory Visit: Payer: Self-pay

## 2016-12-14 ENCOUNTER — Emergency Department (HOSPITAL_COMMUNITY)
Admission: EM | Admit: 2016-12-14 | Discharge: 2016-12-14 | Disposition: A | Discharge status: Home / Self Care | Payer: Medicare Other | Attending: Emergency Medicine | Admitting: Emergency Medicine

## 2016-12-14 ENCOUNTER — Encounter (HOSPITAL_COMMUNITY): Payer: Self-pay | Admitting: Emergency Medicine

## 2016-12-14 DIAGNOSIS — S90424A Blister (nonthermal), right lesser toe(s), initial encounter: Secondary | ICD-10-CM | POA: Insufficient documentation

## 2016-12-14 DIAGNOSIS — L0889 Other specified local infections of the skin and subcutaneous tissue: Secondary | ICD-10-CM | POA: Insufficient documentation

## 2016-12-14 DIAGNOSIS — Z7984 Long term (current) use of oral hypoglycemic drugs: Secondary | ICD-10-CM | POA: Diagnosis not present

## 2016-12-14 DIAGNOSIS — Y999 Unspecified external cause status: Secondary | ICD-10-CM | POA: Diagnosis not present

## 2016-12-14 DIAGNOSIS — X58XXXA Exposure to other specified factors, initial encounter: Secondary | ICD-10-CM | POA: Insufficient documentation

## 2016-12-14 DIAGNOSIS — F17210 Nicotine dependence, cigarettes, uncomplicated: Secondary | ICD-10-CM | POA: Insufficient documentation

## 2016-12-14 DIAGNOSIS — E11621 Type 2 diabetes mellitus with foot ulcer: Secondary | ICD-10-CM | POA: Diagnosis not present

## 2016-12-14 DIAGNOSIS — Z79899 Other long term (current) drug therapy: Secondary | ICD-10-CM | POA: Diagnosis not present

## 2016-12-14 DIAGNOSIS — Y9389 Activity, other specified: Secondary | ICD-10-CM | POA: Diagnosis not present

## 2016-12-14 DIAGNOSIS — L97519 Non-pressure chronic ulcer of other part of right foot with unspecified severity: Secondary | ICD-10-CM | POA: Insufficient documentation

## 2016-12-14 DIAGNOSIS — G8929 Other chronic pain: Secondary | ICD-10-CM | POA: Diagnosis not present

## 2016-12-14 DIAGNOSIS — Y929 Unspecified place or not applicable: Secondary | ICD-10-CM | POA: Diagnosis not present

## 2016-12-14 DIAGNOSIS — L089 Local infection of the skin and subcutaneous tissue, unspecified: Secondary | ICD-10-CM

## 2016-12-14 DIAGNOSIS — T148XXA Other injury of unspecified body region, initial encounter: Secondary | ICD-10-CM

## 2016-12-14 LAB — COMPREHENSIVE METABOLIC PANEL
ALT: 22 U/L (ref 17–63)
AST: 23 U/L (ref 15–41)
Albumin: 4.3 g/dL (ref 3.5–5.0)
Alkaline Phosphatase: 130 U/L — ABNORMAL HIGH (ref 38–126)
Anion gap: 8 (ref 5–15)
BILIRUBIN TOTAL: 0.3 mg/dL (ref 0.3–1.2)
BUN: 19 mg/dL (ref 6–20)
CALCIUM: 9.6 mg/dL (ref 8.9–10.3)
CO2: 25 mmol/L (ref 22–32)
Chloride: 102 mmol/L (ref 101–111)
Creatinine, Ser: 1.04 mg/dL (ref 0.61–1.24)
GFR calc Af Amer: >60 mL/min (ref >60)
Glucose, Bld: 190 mg/dL — ABNORMAL HIGH (ref 65–99)
POTASSIUM: 3.4 mmol/L — AB (ref 3.5–5.1)
Sodium: 135 mmol/L (ref 135–145)
TOTAL PROTEIN: 8 g/dL (ref 6.5–8.1)

## 2016-12-14 LAB — CBC WITH DIFFERENTIAL/PLATELET
Basophils Absolute: 0.1 10*3/uL (ref 0.0–0.1)
Basophils Relative: 0 %
Eosinophils Absolute: 0.2 10*3/uL (ref 0.0–0.7)
Eosinophils Relative: 2 %
HEMATOCRIT: 49.9 % (ref 39.0–52.0)
Hemoglobin: 15.9 g/dL (ref 13.0–17.0)
LYMPHS ABS: 3.7 10*3/uL (ref 0.7–4.0)
Lymphocytes Relative: 26 %
MCH: 27.7 pg (ref 26.0–34.0)
MCHC: 31.9 g/dL (ref 30.0–36.0)
MCV: 86.8 fL (ref 78.0–100.0)
MONO ABS: 0.5 10*3/uL (ref 0.1–1.0)
MONOS PCT: 4 %
NEUTROS ABS: 9.6 10*3/uL — AB (ref 1.7–7.7)
Neutrophils Relative %: 68 %
Platelets: 203 10*3/uL (ref 150–400)
RBC: 5.75 MIL/uL (ref 4.22–5.81)
RDW: 14.3 % (ref 11.5–15.5)
WBC: 14 10*3/uL — ABNORMAL HIGH (ref 4.0–10.5)

## 2016-12-14 MED ORDER — ACETAMINOPHEN 325 MG PO TABS
650.0000 mg | ORAL_TABLET | Freq: Once | ORAL | Status: AC
  Administered 2016-12-14: 650 mg via ORAL
  Filled 2016-12-14: qty 2

## 2016-12-14 MED ORDER — AMOXICILLIN-POT CLAVULANATE 875-125 MG PO TABS: 1.0000 | ORAL_TABLET | Freq: Two times a day (BID) | ORAL | 0 refills | Status: DC

## 2016-12-14 MED ORDER — AMOXICILLIN-POT CLAVULANATE 875-125 MG PO TABS
1.0000 | ORAL_TABLET | Freq: Once | ORAL | Status: AC
  Administered 2016-12-14: 1 via ORAL
  Filled 2016-12-14: qty 1

## 2016-12-14 NOTE — ED Notes (Signed)
Pt left without letting staff know- [name], Network engineer saw pt walking out of room. Attempted to walk outside to give pt anbx prescription, but could not find him. Dr Dorette Grate arware.

## 2016-12-14 NOTE — ED Provider Notes (Signed)
[facility] EMERGENCY DEPARTMENT Provider Note   CSN: [id number] Arrival date & time: 12/14/16  1707     History   Chief Complaint Chief Complaint  Patient presents with  . Foot Ulcer    HPI [name] is a 51 y.o. male.  He complains of a blister on the right foot similar to one that he had on his left foot that required surgery ultimately.  He feels like the blister is infected because he feels heat in his right foot and ankle area.  He denies nausea, vomiting, fever, chills, shortness of breath, chest pain, weakness or dizziness.  Onset of symptoms, 3 days ago.  The patient had a similar blister on the left foot, that ultimately required operative management, by an orthopedic foot surgeon.  There are no other known modifying factors.  HPI  Past Medical History:  Diagnosis Date  . Anxiety   . Arthritis    "everywhere" (04/21/2012)  . Cellulitis and abscess of foot 12/19/2013   LEFT FOOT  . Chronic pain   . DDD (degenerative disc disease)   . Depression   . Diabetes mellitus without complication (HCC)    borderline  . Diabetic foot ulcer ([place])   . ETOH abuse   . GERD (gastroesophageal reflux disease)    tums  . History of blood transfusion    "related to left knee OR; probably right hip too" (04/21/2012)  . Mental disorder   . Neuromuscular disorder (HCC)    neuropathy  . Noncompliance   . Open wound    bottom of foot  . Pneumonia ~ 2012  . Polysubstance abuse (HCC)    etoh, cocaine, heroin  . Stroke [facility]) 2008   "they said I might have had one during right hip replacement" (04/21/2012)    Patient Active Problem List   Diagnosis Date Noted  . SVT (supraventricular tachycardia) ([place]) 08/17/2016  . Tobacco use disorder 10/25/2014  . Diabetes mellitus type 2 in nonobese (HCC)   . Swelling   . Type 2 diabetes mellitus with left diabetic foot ulcer ([place]) 12/18/2013  . Diabetes mellitus due to underlying condition with foot ulcer ([place]) 12/18/2013  . Left leg  cellulitis 12/18/2013  . Cellulitis of right lower extremity 07/19/2013  . Cocaine abuse ([place]) 03/29/2012  . Generalized anxiety disorder 03/29/2012  . Panic attacks 03/29/2012  . Alcohol dependence ([place]) 08/05/2011    Class: Chronic  . Substance abuse ([place]) 05/31/2011  . Foot ulcer, left ([place]) 02/15/2011  . Fasting hyperglycemia 02/15/2011  . Neuropathic ulcer of foot ([place]) 02/15/2011    Past Surgical History:  Procedure Laterality Date  . JOINT REPLACEMENT    . KNEE ARTHROSCOPY Bilateral 1980's/1990's  . LUNG LOBECTOMY Left ~ 2006  . LUNG LOBECTOMY    . METATARSAL OSTEOTOMY  10/29/2011   Procedure: METATARSAL OSTEOTOMY;  Surgeon: [name], MD;  Location: [place];  Service: Orthopedics;  Laterality: Left;  Left 1st Metatarsal Dorsal Closing Wedge   . REVISION TOTAL HIP ARTHROPLASTY Right 2008   "4-5 months after replacement" (04/21/2012)  . TOTAL HIP ARTHROPLASTY Right 2008  . TOTAL KNEE ARTHROPLASTY Left 2006  . TOTAL KNEE ARTHROPLASTY Right 04/20/2012  . TOTAL KNEE ARTHROPLASTY Right 04/20/2012   Procedure: TOTAL KNEE ARTHROPLASTY;  Surgeon: [name], MD;  Location: [place];  Service: Orthopedics;  Laterality: Right;  Right Total Knee Arthroplasty       Home Medications    Prior to Admission medications   Medication Sig Start Date  End Date Taking? Authorizing Provider  blood glucose meter kit and supplies KIT Dispense based on patient and insurance preference. Use up to four times daily as directed. (FOR ICD-9 250.00, 250.01). 08/18/16  Yes Orvan Falconer, MD  dapagliflozin propanediol (FARXIGA) 10 MG TABS tablet Take 10 mg by mouth daily.   Yes [provider]  diltiazem (CARDIZEM) 30 MG tablet Take 1 tablet (30 mg total) by mouth 2 (two) times daily. 09/10/16  Yes Lendon Colonel, NP  escitalopram (LEXAPRO) 20 MG tablet Take 20 mg by mouth daily.    Yes [provider]  gabapentin (NEURONTIN) 400 MG capsule Take 1 capsule (400 mg total) by mouth 4 (four)  times daily. For substance withdrawal syndrome Patient taking differently: Take 400 mg by mouth 3 (three) times daily. For substance withdrawal syndrome 03/29/13  Yes Lindell Spar I, NP  metFORMIN (GLUCOPHAGE) 500 MG tablet Take 1 tablet (500 mg total) by mouth 2 (two) times daily with a meal. 01/15/14  Yes Philemon Kingdom, MD  mirtazapine (REMERON) 30 MG tablet Take 30 mg by mouth at bedtime.   Yes [provider]  QUEtiapine (SEROQUEL) 200 MG tablet Take 1 tablet (200 mg total) by mouth at bedtime. For mood control 03/29/13  Yes Lindell Spar I, NP  thiamine 100 MG tablet Take 1 tablet (100 mg total) by mouth daily. 08/19/16  Yes Orvan Falconer, MD    Family History Family History  Problem Relation Age of Onset  . Diabetes Mother   . Diabetes Father     Social History Social History   Tobacco Use  . Smoking status: Current Every Day Smoker    Packs/day: 1.00    Years: 30.00    Pack years: 30.00    Types: Cigarettes  . Smokeless tobacco: Never Used  Substance Use Topics  . Alcohol use: Yes    Alcohol/week: 0.0 oz  . Drug use: Yes    Types: Cocaine    Comment: Cocaine and heroin      Allergies   Benadryl [diphenhydramine hcl] and Trazodone and nefazodone   Review of Systems Review of Systems  All other systems reviewed and are negative.    Physical Exam Updated Vital Signs BP (!) 94/59   Pulse 82   Temp 98 F (36.7 C) (Oral)   Resp 17   Ht '6\' 3"'$  (1.905 m)   Wt 90.7 kg (200 lb)   SpO2 97%   BMI 25.00 kg/m   Physical Exam  Constitutional: He is oriented to person, place, and time. He appears well-developed and well-nourished.  HENT:  Head: Normocephalic and atraumatic.  Right Ear: External ear normal.  Left Ear: External ear normal.  Eyes: Conjunctivae and EOM are normal. Pupils are equal, round, and reactive to light.  Neck: Normal range of motion and phonation normal. Neck supple.  Cardiovascular: Normal rate.  Pulmonary/Chest: Effort normal. He  exhibits no bony tenderness.  Musculoskeletal:  3 cm blister over the ball of the foot, first MTP region.  No drainage or bleeding from the site.  The blister is slightly tender.  Mild erythema and warmth, diffusely of the right foot, and right medial ankle region.  No visible streaking.  No pain on range of motion of the right lower leg, ankle or foot.  Neurological: He is alert and oriented to person, place, and time. No cranial nerve deficit or sensory deficit. He exhibits normal muscle tone. Coordination normal.  Skin: Skin is warm, dry and intact.  Psychiatric: He  has a normal mood and affect. His behavior is normal. Judgment and thought content normal.  Nursing note and vitals reviewed.    ED Treatments / Results  Labs (all labs ordered are listed, but only abnormal results are displayed) Labs Reviewed  CBC WITH DIFFERENTIAL/PLATELET - Abnormal; Notable for the following components:      Result Value   WBC 14.0 (*)    Neutro Abs 9.6 (*)    All other components within normal limits  COMPREHENSIVE METABOLIC PANEL - Abnormal; Notable for the following components:   Potassium 3.4 (*)    Glucose, Bld 190 (*)    Alkaline Phosphatase 130 (*)    All other components within normal limits    EKG  EKG Interpretation None       Radiology No results found.  Procedures Procedures (including critical care time)  Medications Ordered in ED Medications  acetaminophen (TYLENOL) tablet 650 mg (650 mg Oral Given 12/14/16 2232)  amoxicillin-clavulanate (AUGMENTIN) 875-125 MG per tablet 1 tablet (1 tablet Oral Given 12/14/16 2232)     Initial Impression / Assessment and Plan / ED Course  I have reviewed the triage vital signs and the nursing notes.  Pertinent labs & imaging results that were available during my care of the patient were reviewed by me and considered in my medical decision making (see chart for details).      Patient Vitals for the past 24 hrs:  BP Temp Temp src  Pulse Resp SpO2 Height Weight  12/14/16 2200 (!) 94/59 - - 82 17 97 % - -  12/14/16 2100 105/68 - - 83 17 98 % - -  12/14/16 2030 112/72 - - 82 - 99 % - -  12/14/16 1947 (!) 145/83 98 F (36.7 C) Oral 86 (!) 24 100 % - -  12/14/16 1740 120/78 99.1 F (37.3 C) Oral 93 20 97 % - -  12/14/16 1738 - - - - - - '6\' 3"'$  (1.905 m) 90.7 kg (200 lb)    At discharge -reevaluation with update and discussion. After initial assessment and treatment, an updated evaluation reveals findings discussed with the patient all questions were answered. Daleen Bo      Final Clinical Impressions(s) / ED Diagnoses   Final diagnoses:  Blister of fifth toe of right foot, initial encounter  Wound infection   Evaluations consistent with wound infection/blister, without suspected deep tissue infection, osteomyelitis or sepsis.  Doubt metabolic instability.  Nursing Notes Reviewed/ Care Coordinated Applicable Imaging Reviewed Interpretation of Laboratory Data incorporated into ED treatment  After I discussed discharge with the patient he left AGAINST MEDICAL ADVICE, without taking his prescription for treatment of infection in blister of foot.  ED Discharge Orders    None       Daleen Bo, MD 12/15/16 [phone]

## 2016-12-14 NOTE — ED Triage Notes (Signed)
PT c/o worsening in sores on right foot and redness traveling up right leg with pain x3 days.

## 2016-12-14 NOTE — Discharge Instructions (Signed)
The blister on your right foot appears to have become infected.  To treat the blister, soak the right foot in warm water 3 or 4 times a day.  Also elevate your foot above your heart 3-4 times a day for 1 hour.  Start taking the antibiotic prescription in the morning.  Call your orthopedic doctor for a follow-up appointment to be seen as soon as possible for further evaluation and treatment of the blister with infection.  Return here, if needed, for problems.

## 2016-12-26 IMAGING — CR DG ANKLE COMPLETE 3+V*L*
1 series · 3 of 3 positions shown · non-contrast
Comparison: 03/25/2014

CLINICAL DATA: Left ankle pain since a twisting injury in the yard.

EXAM:
LEFT ANKLE COMPLETE - 3+ VIEW

[Series 1: ap · 0.17mm/px · 3 of 3 slices shown]
[im 1/3]
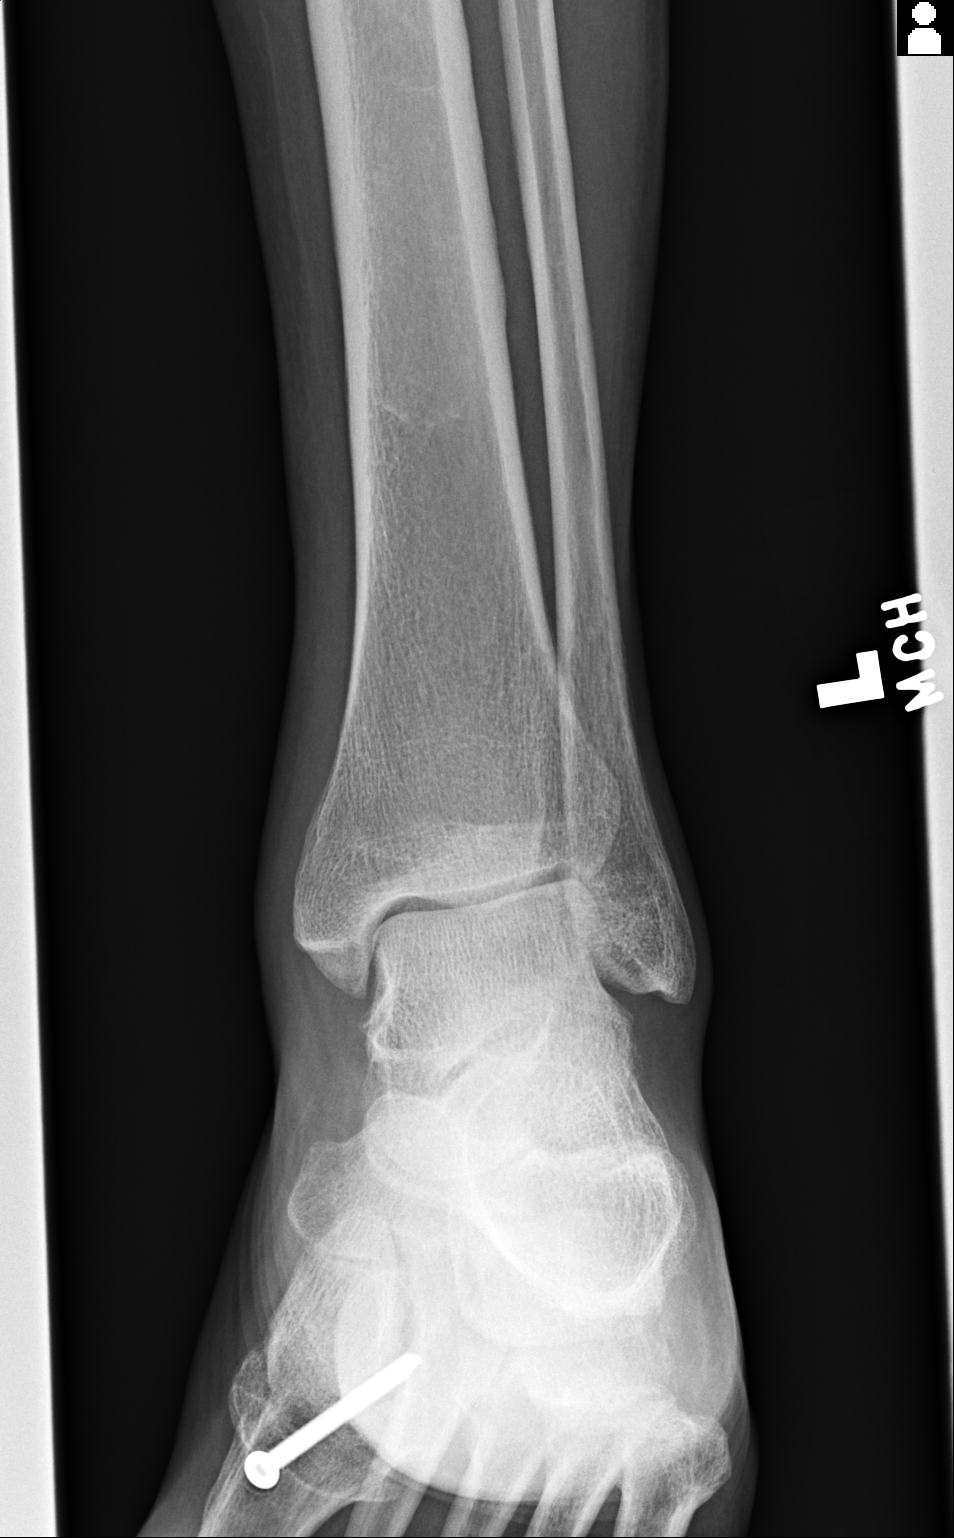
[im 2/3]
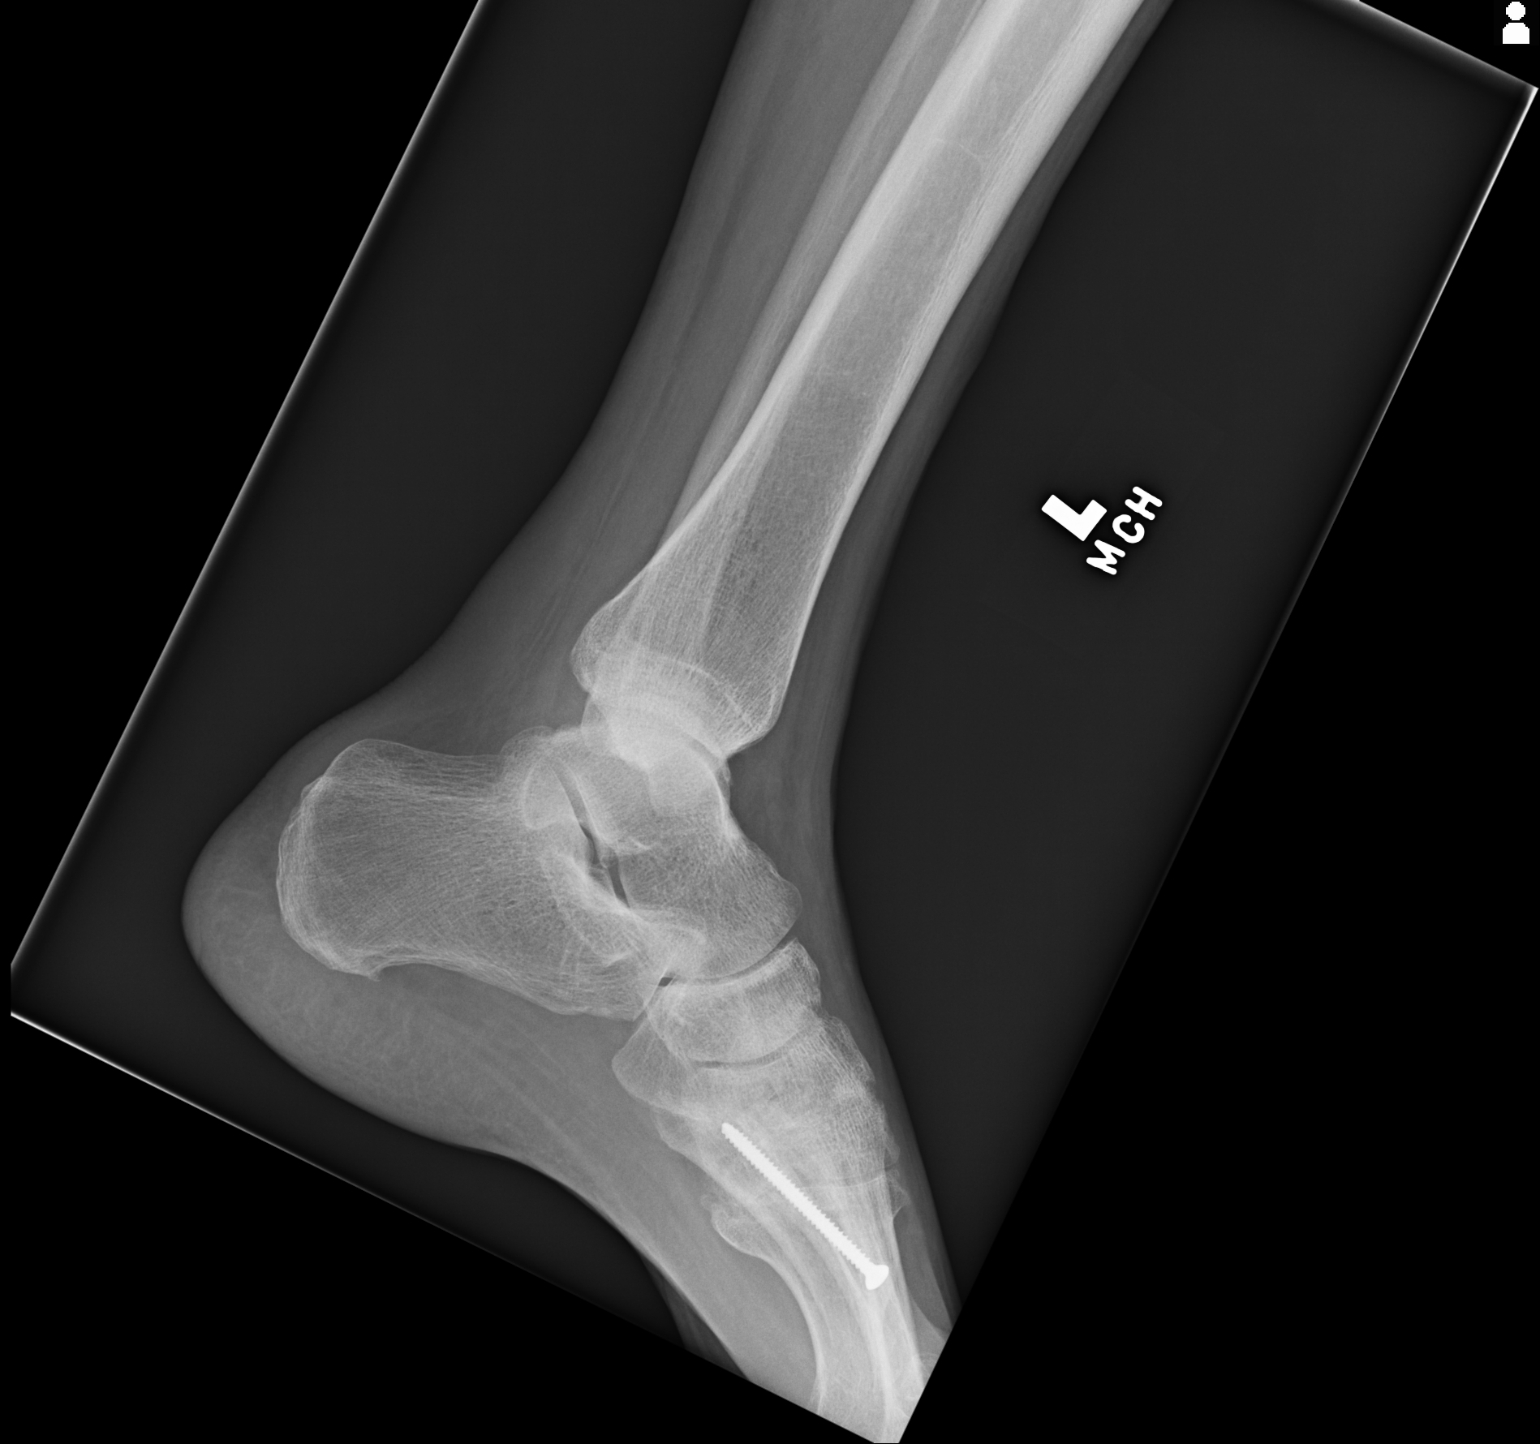
[im 3/3]
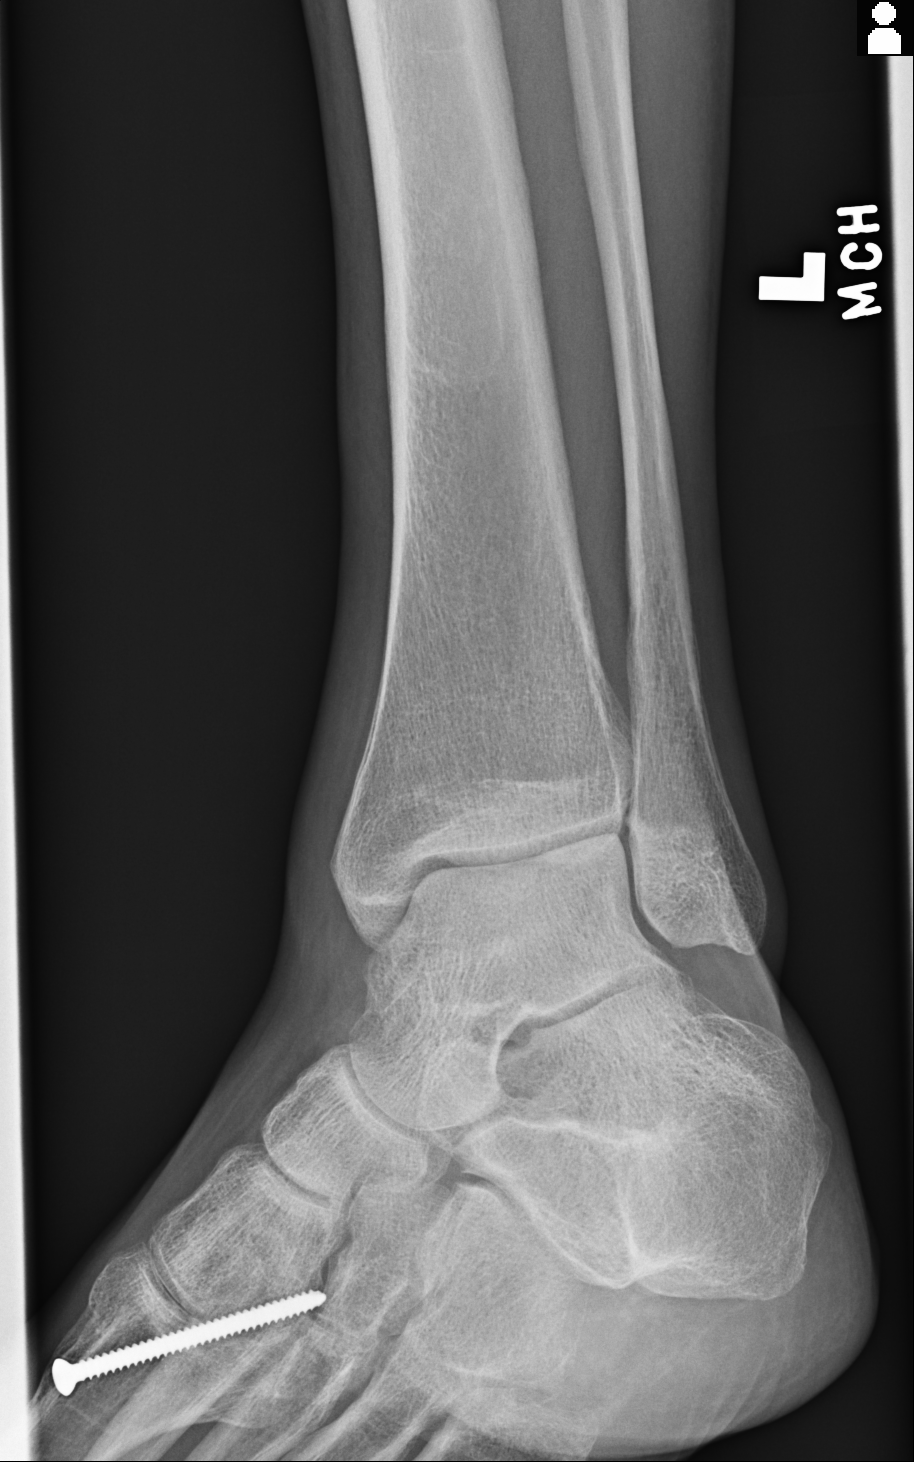

[3 of 3 positions shown; findings below may reference images not displayed]

FINDINGS: There is no fracture or dislocation or joint effusion. There is a
screw through the first tarsometatarsal joint.
IMPRESSION: No acute abnormality.

## 2017-01-06 ENCOUNTER — Emergency Department (HOSPITAL_COMMUNITY)
Admission: EM | Admit: 2017-01-06 | Discharge: 2017-01-06 | Disposition: A | Discharge status: Home / Self Care | Payer: Medicare Other | Attending: Emergency Medicine | Admitting: Emergency Medicine

## 2017-01-06 ENCOUNTER — Emergency Department (HOSPITAL_COMMUNITY): Payer: Medicare Other

## 2017-01-06 ENCOUNTER — Other Ambulatory Visit: Payer: Self-pay

## 2017-01-06 ENCOUNTER — Encounter (HOSPITAL_COMMUNITY): Payer: Self-pay | Admitting: Emergency Medicine

## 2017-01-06 DIAGNOSIS — Y939 Activity, unspecified: Secondary | ICD-10-CM | POA: Diagnosis not present

## 2017-01-06 DIAGNOSIS — Y929 Unspecified place or not applicable: Secondary | ICD-10-CM | POA: Insufficient documentation

## 2017-01-06 DIAGNOSIS — Z7984 Long term (current) use of oral hypoglycemic drugs: Secondary | ICD-10-CM | POA: Insufficient documentation

## 2017-01-06 DIAGNOSIS — X509XXA Other and unspecified overexertion or strenuous movements or postures, initial encounter: Secondary | ICD-10-CM | POA: Insufficient documentation

## 2017-01-06 DIAGNOSIS — Y999 Unspecified external cause status: Secondary | ICD-10-CM | POA: Diagnosis not present

## 2017-01-06 DIAGNOSIS — F17210 Nicotine dependence, cigarettes, uncomplicated: Secondary | ICD-10-CM | POA: Diagnosis not present

## 2017-01-06 DIAGNOSIS — Z96641 Presence of right artificial hip joint: Secondary | ICD-10-CM | POA: Insufficient documentation

## 2017-01-06 DIAGNOSIS — Z79899 Other long term (current) drug therapy: Secondary | ICD-10-CM | POA: Insufficient documentation

## 2017-01-06 DIAGNOSIS — S8991XA Unspecified injury of right lower leg, initial encounter: Secondary | ICD-10-CM | POA: Diagnosis present

## 2017-01-06 DIAGNOSIS — S8001XA Contusion of right knee, initial encounter: Secondary | ICD-10-CM | POA: Insufficient documentation

## 2017-01-06 DIAGNOSIS — E119 Type 2 diabetes mellitus without complications: Secondary | ICD-10-CM | POA: Insufficient documentation

## 2017-01-06 DIAGNOSIS — Z96653 Presence of artificial knee joint, bilateral: Secondary | ICD-10-CM | POA: Diagnosis not present

## 2017-01-06 LAB — CBC WITH DIFFERENTIAL/PLATELET
BASOS PCT: 0 %
Basophils Absolute: 0 10*3/uL (ref 0.0–0.1)
EOS PCT: 2 %
Eosinophils Absolute: 0.2 10*3/uL (ref 0.0–0.7)
HEMATOCRIT: 40.4 % (ref 39.0–52.0)
Hemoglobin: 13.1 g/dL (ref 13.0–17.0)
Lymphocytes Relative: 23 %
Lymphs Abs: 2.2 10*3/uL (ref 0.7–4.0)
MCH: 27 pg (ref 26.0–34.0)
MCHC: 32.4 g/dL (ref 30.0–36.0)
MCV: 83.3 fL (ref 78.0–100.0)
MONO ABS: 0.6 10*3/uL (ref 0.1–1.0)
MONOS PCT: 6 %
NEUTROS ABS: 6.7 10*3/uL (ref 1.7–7.7)
Neutrophils Relative %: 69 %
Platelets: 167 10*3/uL (ref 150–400)
RBC: 4.85 MIL/uL (ref 4.22–5.81)
RDW: 14.6 % (ref 11.5–15.5)
WBC: 9.7 10*3/uL (ref 4.0–10.5)

## 2017-01-06 LAB — BASIC METABOLIC PANEL
Anion gap: 10 (ref 5–15)
BUN: 21 mg/dL — ABNORMAL HIGH (ref 6–20)
CALCIUM: 10.2 mg/dL (ref 8.9–10.3)
CO2: 24 mmol/L (ref 22–32)
CREATININE: 1.49 mg/dL — AB (ref 0.61–1.24)
Chloride: 100 mmol/L — ABNORMAL LOW (ref 101–111)
GFR calc non Af Amer: 52 mL/min — ABNORMAL LOW (ref >60)
GLUCOSE: 237 mg/dL — AB (ref 65–99)
Potassium: 4 mmol/L (ref 3.5–5.1)
Sodium: 134 mmol/L — ABNORMAL LOW (ref 135–145)

## 2017-01-06 LAB — CBG MONITORING, ED: Glucose-Capillary: 274 mg/dL — ABNORMAL HIGH (ref 65–99)

## 2017-01-06 MED ORDER — HYDROCODONE-ACETAMINOPHEN 5-325 MG PO TABS
1.0000 | ORAL_TABLET | Freq: Once | ORAL | Status: AC
  Administered 2017-01-06: 1 via ORAL
  Filled 2017-01-06: qty 1

## 2017-01-06 MED ORDER — OXYCODONE-ACETAMINOPHEN 5-325 MG PO TABS: 1.0000 | ORAL_TABLET | Freq: Four times a day (QID) | ORAL | 0 refills | Status: DC | PRN

## 2017-01-06 NOTE — Discharge Instructions (Signed)
Follow-up with Dr. Cindie Laroche in the next couple weeks.

## 2017-01-06 NOTE — ED Triage Notes (Signed)
Pt c/o wounds to both feet x 1 month. Has home health and has another weeks worth of abx. Limping noted due to pain. Pt states has odor also.

## 2017-01-06 NOTE — ED Provider Notes (Signed)
[facility] EMERGENCY DEPARTMENT Provider Note   CSN: [id number] Arrival date & time: 01/06/17  1153     History   Chief Complaint Chief Complaint  Patient presents with  . Wound Infection    HPI [name] is a 51 y.o. male.  Patient fell yesterday and complains of right knee pain he also has history of ulcers on his feet that he gets daily dressing changes and takes antibiotics for   The history is provided by the patient. No language interpreter was used.  Fall  This is a new problem. The current episode started 2 days ago. The problem occurs rarely. The problem has not changed since onset.Pertinent negatives include no chest pain, no abdominal pain and no headaches. Exacerbated by: Movement of knee. Nothing relieves the symptoms. He has tried nothing for the symptoms. The treatment provided no relief.    Past Medical History:  Diagnosis Date  . Anxiety   . Arthritis    "everywhere" (04/21/2012)  . Cellulitis and abscess of foot 12/19/2013   LEFT FOOT  . Chronic pain   . DDD (degenerative disc disease)   . Depression   . Diabetes mellitus without complication (HCC)    borderline  . Diabetic foot ulcer ([place])   . ETOH abuse   . GERD (gastroesophageal reflux disease)    tums  . History of blood transfusion    "related to left knee OR; probably right hip too" (04/21/2012)  . Mental disorder   . Neuromuscular disorder (HCC)    neuropathy  . Noncompliance   . Open wound    bottom of foot  . Pneumonia ~ 2012  . Polysubstance abuse (HCC)    etoh, cocaine, heroin  . Stroke [facility]) 2008   "they said I might have had one during right hip replacement" (04/21/2012)    Patient Active Problem List   Diagnosis Date Noted  . SVT (supraventricular tachycardia) ([place]) 08/17/2016  . Tobacco use disorder 10/25/2014  . Diabetes mellitus type 2 in nonobese (HCC)   . Swelling   . Type 2 diabetes mellitus with left diabetic foot ulcer ([place]) 12/18/2013  . Diabetes mellitus due to  underlying condition with foot ulcer ([place]) 12/18/2013  . Left leg cellulitis 12/18/2013  . Cellulitis of right lower extremity 07/19/2013  . Cocaine abuse ([place]) 03/29/2012  . Generalized anxiety disorder 03/29/2012  . Panic attacks 03/29/2012  . Alcohol dependence ([place]) 08/05/2011    Class: Chronic  . Substance abuse ([place]) 05/31/2011  . Foot ulcer, left ([place]) 02/15/2011  . Fasting hyperglycemia 02/15/2011  . Neuropathic ulcer of foot ([place]) 02/15/2011    Past Surgical History:  Procedure Laterality Date  . JOINT REPLACEMENT    . KNEE ARTHROSCOPY Bilateral 1980's/1990's  . LUNG LOBECTOMY Left ~ 2006  . LUNG LOBECTOMY    . METATARSAL OSTEOTOMY  10/29/2011   Procedure: METATARSAL OSTEOTOMY;  Surgeon: [name], MD;  Location: [place];  Service: Orthopedics;  Laterality: Left;  Left 1st Metatarsal Dorsal Closing Wedge   . REVISION TOTAL HIP ARTHROPLASTY Right 2008   "4-5 months after replacement" (04/21/2012)  . TOTAL HIP ARTHROPLASTY Right 2008  . TOTAL KNEE ARTHROPLASTY Left 2006  . TOTAL KNEE ARTHROPLASTY Right 04/20/2012  . TOTAL KNEE ARTHROPLASTY Right 04/20/2012   Procedure: TOTAL KNEE ARTHROPLASTY;  Surgeon: [name], MD;  Location: [place];  Service: Orthopedics;  Laterality: Right;  Right Total Knee Arthroplasty       Home Medications    Prior to Admission  medications   Medication Sig Start Date End Date Taking? Authorizing Provider  blood glucose meter kit and supplies KIT Dispense based on patient and insurance preference. Use up to four times daily as directed. (FOR ICD-9 250.00, 250.01). 08/18/16  Yes Orvan Falconer, MD  dapagliflozin propanediol (FARXIGA) 10 MG TABS tablet Take 10 mg by mouth daily.   Yes [provider]  diltiazem (CARDIZEM) 30 MG tablet Take 1 tablet (30 mg total) by mouth 2 (two) times daily. 09/10/16  Yes Lendon Colonel, NP  escitalopram (LEXAPRO) 20 MG tablet Take 20 mg by mouth daily.    Yes [provider]  gabapentin  (NEURONTIN) 400 MG capsule Take 1 capsule (400 mg total) by mouth 4 (four) times daily. For substance withdrawal syndrome Patient taking differently: Take 400 mg by mouth 3 (three) times daily. For substance withdrawal syndrome 03/29/13  Yes Lindell Spar I, NP  metFORMIN (GLUCOPHAGE) 500 MG tablet Take 1 tablet (500 mg total) by mouth 2 (two) times daily with a meal. 01/15/14  Yes Philemon Kingdom, MD  mirtazapine (REMERON) 30 MG tablet Take 30 mg by mouth at bedtime.   Yes [provider]  QUEtiapine (SEROQUEL) 200 MG tablet Take 1 tablet (200 mg total) by mouth at bedtime. For mood control 03/29/13  Yes Lindell Spar I, NP  thiamine 100 MG tablet Take 1 tablet (100 mg total) by mouth daily. 08/19/16  Yes Orvan Falconer, MD  amoxicillin-clavulanate (AUGMENTIN) 875-125 MG tablet Take 1 tablet by mouth 2 (two) times daily. One po bid x 7 days 12/14/16   Daleen Bo, MD  oxyCODONE-acetaminophen (PERCOCET/ROXICET) 5-325 MG tablet Take 1 tablet by mouth every 6 (six) hours as needed. 01/06/17   Milton Ferguson, MD    Family History Family History  Problem Relation Age of Onset  . Diabetes Mother   . Diabetes Father     Social History Social History   Tobacco Use  . Smoking status: Current Every Day Smoker    Packs/day: 1.00    Years: 30.00    Pack years: 30.00    Types: Cigarettes  . Smokeless tobacco: Never Used  Substance Use Topics  . Alcohol use: No    Alcohol/week: 0.0 oz    Frequency: Never  . Drug use: No    Comment: Cocaine and heroin      Allergies   Benadryl [diphenhydramine hcl] and Trazodone and nefazodone   Review of Systems Review of Systems  Constitutional: Negative for appetite change and fatigue.  HENT: Negative for congestion, ear discharge and sinus pressure.   Eyes: Negative for discharge.  Respiratory: Negative for cough.   Cardiovascular: Negative for chest pain.  Gastrointestinal: Negative for abdominal pain and diarrhea.  Genitourinary: Negative  for frequency and hematuria.  Musculoskeletal: Negative for back pain.       Pain in right knee  Skin: Negative for rash.  Neurological: Negative for seizures and headaches.  Psychiatric/Behavioral: Negative for hallucinations.     Physical Exam Updated Vital Signs BP 112/65 (BP Location: Left Arm)   Pulse 64   Temp (!) 97.5 F (36.4 C) (Oral)   Resp 18   Ht _0  (1.905 m)   Wt 90.7 kg (200 lb)   SpO2 100%   BMI 25.00 kg/m   Physical Exam  Constitutional: He is oriented to person, place, and time. He appears well-developed.  HENT:  Head: Normocephalic.  Eyes: Conjunctivae are normal.  Neck: No tracheal deviation present.  Cardiovascular:  No murmur heard.  Musculoskeletal: Normal range of motion.  Patient has ulcers on the bottom of both feet that seem to be healing.  Also get tenderness to right knee  Neurological: He is oriented to person, place, and time.  Skin: Skin is warm.  Psychiatric: He has a normal mood and affect.     ED Treatments / Results  Labs (all labs ordered are listed, but only abnormal results are displayed) Labs Reviewed  BASIC METABOLIC PANEL - Abnormal; Notable for the following components:      Result Value   Sodium 134 (*)    Chloride 100 (*)    Glucose, Bld 237 (*)    BUN 21 (*)    Creatinine, Ser 1.49 (*)    GFR calc non Af Amer 52 (*)    All other components within normal limits  CBG MONITORING, ED - Abnormal; Notable for the following components:   Glucose-Capillary 274 (*)    All other components within normal limits  CBC WITH DIFFERENTIAL/PLATELET    EKG  EKG Interpretation None       Radiology Dg Knee Complete 4 Views Right  Result Date: 01/06/2017 CLINICAL DATA:  Right knee pain after fall. EXAM: RIGHT KNEE - COMPLETE 4+ VIEW COMPARISON:  08/16/2016 FINDINGS: Status post tricompartmental knee replacement. No evidence for an acute fracture. No dislocation. No evidence for joint effusion. IMPRESSION: Status post total  knee replacement without acute abnormality. Electronically Signed   By: Misty Stanley M.D.   On: 01/06/2017 13:49    Procedures Procedures (including critical care time)  Medications Ordered in ED Medications  HYDROcodone-acetaminophen (NORCO/VICODIN) 5-325 MG per tablet 1 tablet (1 tablet Oral Given 01/06/17 1332)     Initial Impression / Assessment and Plan / ED Course  I have reviewed the triage vital signs and the nursing notes.  Pertinent labs & imaging results that were available during my care of the patient were reviewed by me and considered in my medical decision making (see chart for details).     Contusion to right knee with healing ulcers to feet.  He will follow-up with his PCP and is given some Percocet for pain  Final Clinical Impressions(s) / ED Diagnoses   Final diagnoses:  Contusion of right knee, initial encounter    ED Discharge Orders        Ordered    oxyCODONE-acetaminophen (PERCOCET/ROXICET) 5-325 MG tablet  Every 6 hours PRN     01/06/17 1617       Milton Ferguson, MD 01/06/17 1625

## 2017-01-08 ENCOUNTER — Other Ambulatory Visit: Payer: Self-pay

## 2017-01-08 ENCOUNTER — Encounter (HOSPITAL_COMMUNITY): Payer: Self-pay | Admitting: Emergency Medicine

## 2017-01-08 ENCOUNTER — Emergency Department (HOSPITAL_COMMUNITY)
Admission: EM | Admit: 2017-01-08 | Discharge: 2017-01-08 | Disposition: A | Discharge status: Home / Self Care | Payer: Medicare Other | Attending: Emergency Medicine | Admitting: Emergency Medicine

## 2017-01-08 DIAGNOSIS — Z96653 Presence of artificial knee joint, bilateral: Secondary | ICD-10-CM | POA: Diagnosis not present

## 2017-01-08 DIAGNOSIS — M79671 Pain in right foot: Secondary | ICD-10-CM | POA: Diagnosis not present

## 2017-01-08 DIAGNOSIS — E11621 Type 2 diabetes mellitus with foot ulcer: Secondary | ICD-10-CM | POA: Diagnosis not present

## 2017-01-08 DIAGNOSIS — Z7984 Long term (current) use of oral hypoglycemic drugs: Secondary | ICD-10-CM | POA: Diagnosis not present

## 2017-01-08 DIAGNOSIS — Z79899 Other long term (current) drug therapy: Secondary | ICD-10-CM | POA: Diagnosis not present

## 2017-01-08 DIAGNOSIS — Z96641 Presence of right artificial hip joint: Secondary | ICD-10-CM | POA: Insufficient documentation

## 2017-01-08 DIAGNOSIS — L97529 Non-pressure chronic ulcer of other part of left foot with unspecified severity: Secondary | ICD-10-CM | POA: Insufficient documentation

## 2017-01-08 DIAGNOSIS — F17210 Nicotine dependence, cigarettes, uncomplicated: Secondary | ICD-10-CM | POA: Insufficient documentation

## 2017-01-08 MED ORDER — TRAMADOL HCL 50 MG PO TABS: 50.0000 mg | ORAL_TABLET | Freq: Four times a day (QID) | ORAL | 0 refills | Status: DC | PRN

## 2017-01-08 MED ORDER — HYDROMORPHONE HCL 1 MG/ML IJ SOLN
1.0000 mg | Freq: Once | INTRAMUSCULAR | Status: AC
  Administered 2017-01-08: 1 mg via INTRAMUSCULAR
  Filled 2017-01-08: qty 1

## 2017-01-08 NOTE — Discharge Instructions (Signed)
Follow-up with Dr. Caprice Beaver or Dr. due to in the next week.  Try to stay off your feet as much possible

## 2017-01-08 NOTE — ED Triage Notes (Signed)
Pt is diabetic Has had foot ulcers for 1 month  Followed by Dr Benjie Karvonen and home health Here 2 days ago for same Here today for foot pain

## 2017-01-08 NOTE — ED Notes (Signed)
Pt verbalized he is not driving. Has a friend to come pick him up.

## 2017-01-08 NOTE — ED Triage Notes (Signed)
Pt also reports out of his percocet

## 2017-01-08 NOTE — ED Notes (Signed)
Pt stated he would like something for pain.

## 2017-01-08 NOTE — ED Provider Notes (Signed)
[facility] EMERGENCY DEPARTMENT Provider Note   CSN: [id number] Arrival date & time: 01/08/17  1459     History   Chief Complaint Chief Complaint  Patient presents with  . Foot Pain    HPI [name] is a 51 y.o. male.  Patient complains of bilateral foot pain   The history is provided by the patient.  Foot Pain  This is a recurrent problem. The current episode started more than 1 week ago. The problem occurs constantly. The problem has not changed since onset.Pertinent negatives include no chest pain, no abdominal pain and no headaches. The symptoms are aggravated by walking. Nothing relieves the symptoms. He has tried a warm compress for the symptoms. The treatment provided no relief.    Past Medical History:  Diagnosis Date  . Anxiety   . Arthritis    "everywhere" (04/21/2012)  . Cellulitis and abscess of foot 12/19/2013   LEFT FOOT  . Chronic pain   . DDD (degenerative disc disease)   . Depression   . Diabetes mellitus without complication (HCC)    borderline  . Diabetic foot ulcer ([place])   . ETOH abuse   . GERD (gastroesophageal reflux disease)    tums  . History of blood transfusion    "related to left knee OR; probably right hip too" (04/21/2012)  . Mental disorder   . Neuromuscular disorder (HCC)    neuropathy  . Noncompliance   . Open wound    bottom of foot  . Pneumonia ~ 2012  . Polysubstance abuse (HCC)    etoh, cocaine, heroin  . Stroke [facility]) 2008   "they said I might have had one during right hip replacement" (04/21/2012)    Patient Active Problem List   Diagnosis Date Noted  . SVT (supraventricular tachycardia) ([place]) 08/17/2016  . Tobacco use disorder 10/25/2014  . Diabetes mellitus type 2 in nonobese (HCC)   . Swelling   . Type 2 diabetes mellitus with left diabetic foot ulcer ([place]) 12/18/2013  . Diabetes mellitus due to underlying condition with foot ulcer ([place]) 12/18/2013  . Left leg cellulitis 12/18/2013  . Cellulitis of right lower  extremity 07/19/2013  . Cocaine abuse ([place]) 03/29/2012  . Generalized anxiety disorder 03/29/2012  . Panic attacks 03/29/2012  . Alcohol dependence ([place]) 08/05/2011    Class: Chronic  . Substance abuse ([place]) 05/31/2011  . Foot ulcer, left ([place]) 02/15/2011  . Fasting hyperglycemia 02/15/2011  . Neuropathic ulcer of foot ([place]) 02/15/2011    Past Surgical History:  Procedure Laterality Date  . JOINT REPLACEMENT    . KNEE ARTHROSCOPY Bilateral 1980's/1990's  . LUNG LOBECTOMY Left ~ 2006  . LUNG LOBECTOMY    . METATARSAL OSTEOTOMY  10/29/2011   Procedure: METATARSAL OSTEOTOMY;  Surgeon: [name], MD;  Location: [place];  Service: Orthopedics;  Laterality: Left;  Left 1st Metatarsal Dorsal Closing Wedge   . REVISION TOTAL HIP ARTHROPLASTY Right 2008   "4-5 months after replacement" (04/21/2012)  . TOTAL HIP ARTHROPLASTY Right 2008  . TOTAL KNEE ARTHROPLASTY Left 2006  . TOTAL KNEE ARTHROPLASTY Right 04/20/2012  . TOTAL KNEE ARTHROPLASTY Right 04/20/2012   Procedure: TOTAL KNEE ARTHROPLASTY;  Surgeon: [name], MD;  Location: [place];  Service: Orthopedics;  Laterality: Right;  Right Total Knee Arthroplasty       Home Medications    Prior to Admission medications   Medication Sig Start Date End Date Taking? Authorizing Provider  amoxicillin-clavulanate (AUGMENTIN) 875-125 MG tablet Take 1 tablet  by mouth 2 (two) times daily. One po bid x 7 days 12/14/16   Daleen Bo, MD  blood glucose meter kit and supplies KIT Dispense based on patient and insurance preference. Use up to four times daily as directed. (FOR ICD-9 250.00, 250.01). 08/18/16   Orvan Falconer, MD  dapagliflozin propanediol (FARXIGA) 10 MG TABS tablet Take 10 mg by mouth daily.    [provider]  diltiazem (CARDIZEM) 30 MG tablet Take 1 tablet (30 mg total) by mouth 2 (two) times daily. 09/10/16   Lendon Colonel, NP  escitalopram (LEXAPRO) 20 MG tablet Take 20 mg by mouth daily.     [provider]    gabapentin (NEURONTIN) 400 MG capsule Take 1 capsule (400 mg total) by mouth 4 (four) times daily. For substance withdrawal syndrome Patient taking differently: Take 400 mg by mouth 3 (three) times daily. For substance withdrawal syndrome 03/29/13   Lindell Spar I, NP  metFORMIN (GLUCOPHAGE) 500 MG tablet Take 1 tablet (500 mg total) by mouth 2 (two) times daily with a meal. 01/15/14   Philemon Kingdom, MD  mirtazapine (REMERON) 30 MG tablet Take 30 mg by mouth at bedtime.    [provider]  oxyCODONE-acetaminophen (PERCOCET/ROXICET) 5-325 MG tablet Take 1 tablet by mouth every 6 (six) hours as needed. 01/06/17   Milton Ferguson, MD  QUEtiapine (SEROQUEL) 200 MG tablet Take 1 tablet (200 mg total) by mouth at bedtime. For mood control 03/29/13   Lindell Spar I, NP  thiamine 100 MG tablet Take 1 tablet (100 mg total) by mouth daily. 08/19/16   Orvan Falconer, MD  traMADol (ULTRAM) 50 MG tablet Take 1 tablet (50 mg total) by mouth every 6 (six) hours as needed. 01/08/17   Milton Ferguson, MD    Family History Family History  Problem Relation Age of Onset  . Diabetes Mother   . Diabetes Father     Social History Social History   Tobacco Use  . Smoking status: Current Every Day Smoker    Packs/day: 1.00    Years: 30.00    Pack years: 30.00    Types: Cigarettes  . Smokeless tobacco: Never Used  Substance Use Topics  . Alcohol use: No    Alcohol/week: 0.0 oz    Frequency: Never  . Drug use: No    Comment: Cocaine and heroin      Allergies   Benadryl [diphenhydramine hcl] and Trazodone and nefazodone   Review of Systems Review of Systems  Constitutional: Negative for appetite change and fatigue.  HENT: Negative for congestion, ear discharge and sinus pressure.   Eyes: Negative for discharge.  Respiratory: Negative for cough.   Cardiovascular: Negative for chest pain.  Gastrointestinal: Negative for abdominal pain and diarrhea.  Genitourinary: Negative for frequency and  hematuria.  Musculoskeletal: Negative for back pain.  Skin: Negative for rash.  Neurological: Negative for seizures and headaches.  Psychiatric/Behavioral: Negative for hallucinations.     Physical Exam Updated Vital Signs BP 120/72 (BP Location: Right Arm)   Pulse (!) 108   Temp 98.2 F (36.8 C) (Oral)   Resp 20   Ht _0  (1.905 m)   Wt 90.7 kg (200 lb)   SpO2 100%   BMI 25.00 kg/m   Physical Exam  Constitutional: He is oriented to person, place, and time. He appears well-developed.  HENT:  Head: Normocephalic.  Eyes: Conjunctivae are normal.  Neck: No tracheal deviation present.  Cardiovascular:  No murmur heard. Musculoskeletal: Normal range of  motion.  Patient with blisters and ulcers on the bottom of his feet.  No obvious infection.  Neurological: He is oriented to person, place, and time.  Skin: Skin is warm.  Psychiatric: He has a normal mood and affect.     ED Treatments / Results  Labs (all labs ordered are listed, but only abnormal results are displayed) Labs Reviewed - No data to display  EKG  EKG Interpretation None       Radiology No results found.  Procedures Procedures (including critical care time)  Medications Ordered in ED Medications  HYDROmorphone (DILAUDID) injection 1 mg (not administered)     Initial Impression / Assessment and Plan / ED Course  I have reviewed the triage vital signs and the nursing notes.  Pertinent labs & imaging results that were available during my care of the patient were reviewed by me and considered in my medical decision making (see chart for details).     Patient given Ultram for pain and his blisters will be dressed again.  He is referred to podiatry or he can follow-up with his orthopedic doctor  Final Clinical Impressions(s) / ED Diagnoses   Final diagnoses:  Foot pain, right    ED Discharge Orders        Ordered    traMADol (ULTRAM) 50 MG tablet  Every 6 hours PRN     01/08/17 1623         Milton Ferguson, MD 01/08/17 1629

## 2017-01-13 IMAGING — DX DG KNEE COMPLETE 4+V*L*
4 series · 4 of 4 positions shown · non-contrast
Comparison: None.

CLINICAL DATA: Left knee pain after fall from roof. History of knee
replacement and chronic knee pain.

EXAM:
LEFT KNEE - COMPLETE 4+ VIEW

[knee ap]
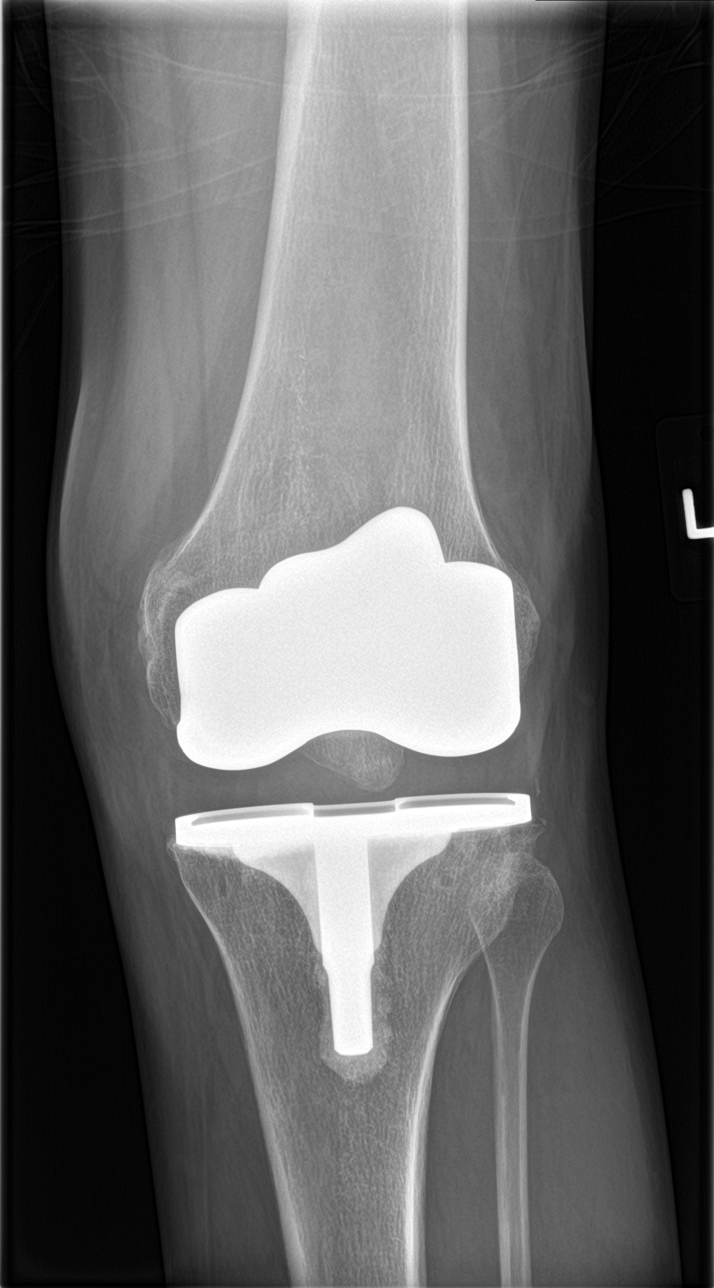

[knee obl (1 of 2)]
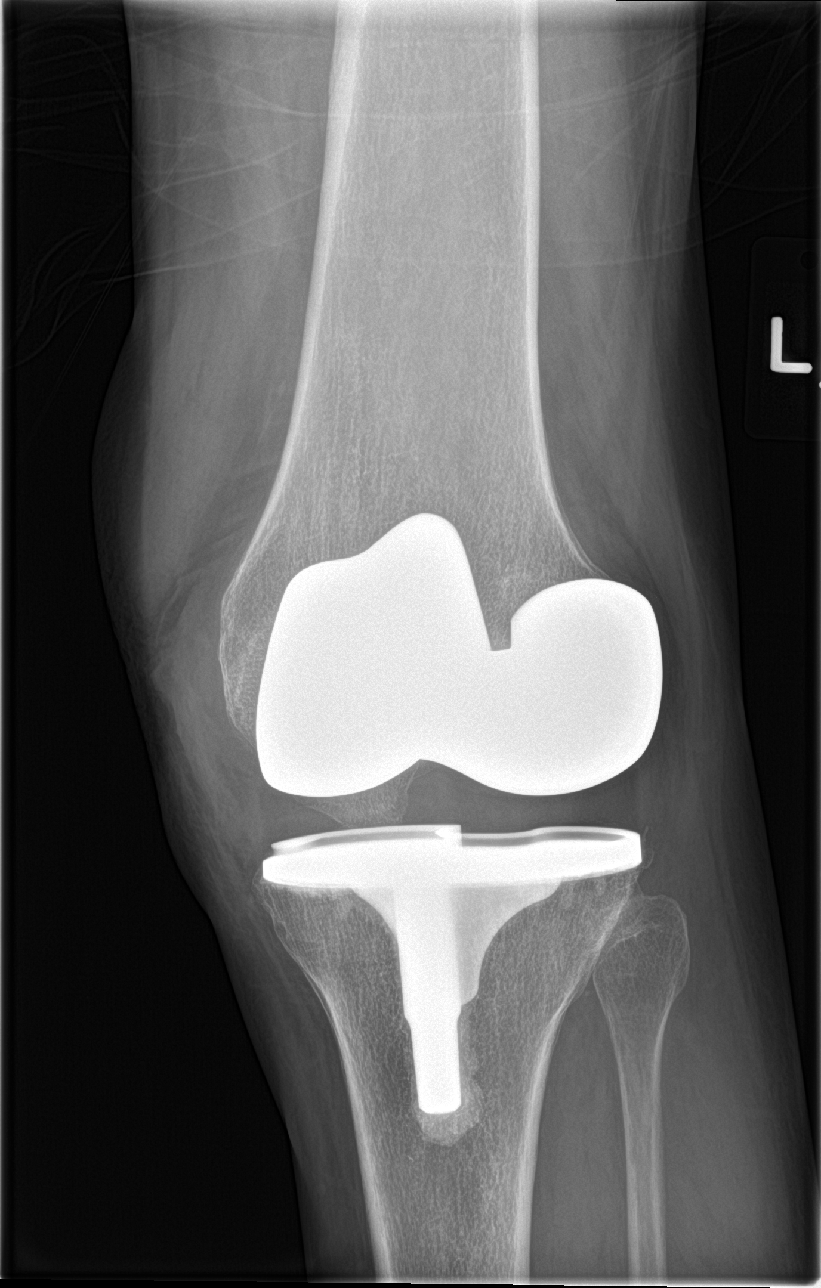

[knee obl (2 of 2)]
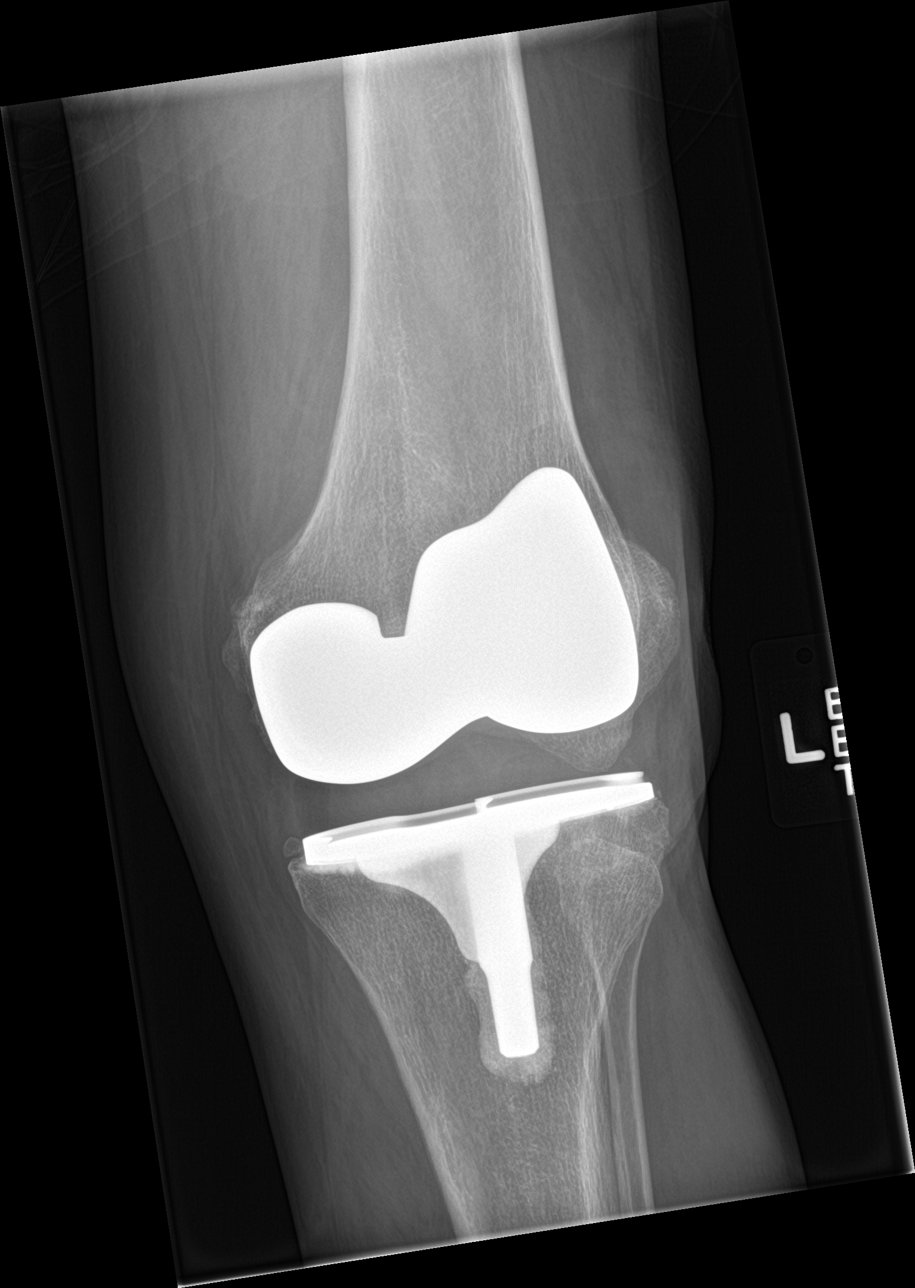

[knee lat]
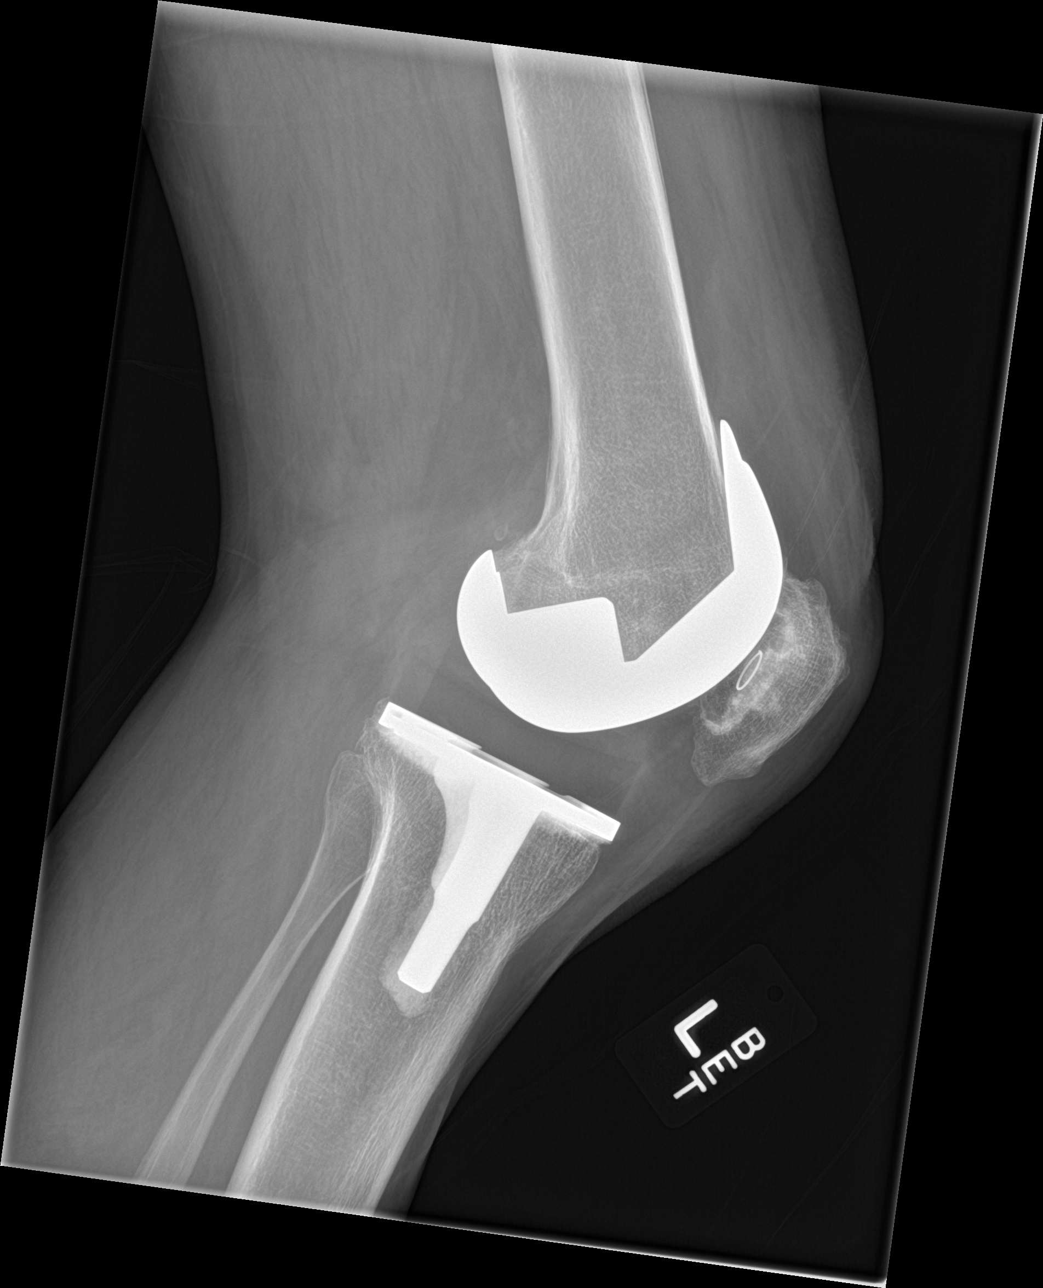

[4 of 4 positions shown; findings below may reference images not displayed]

FINDINGS: Total knee arthroplasty which is well seated. There is no
periprosthetic fracture or dislocation. No visible joint effusion.
IMPRESSION: Unremarkable left knee post total arthroplasty.

## 2017-01-31 IMAGING — US US EXTREM LOW VENOUS*R*
1 series · 14 of 24 positions shown · non-contrast
Comparison: None.

CLINICAL DATA: Right foot swelling for 1 week.

EXAM:
RIGHT LOWER EXTREMITY VENOUS DUPLEX ULTRASOUND
TECHNIQUE: Doppler venous assessment of the right lower extremity deep venous
system was performed, including characterization of spectral flow,
compressibility, and phasicity.

[Series 1: us extrem low venous*right* · 0.06mm/px · 14 of 66 slices shown]
[im 1/66]
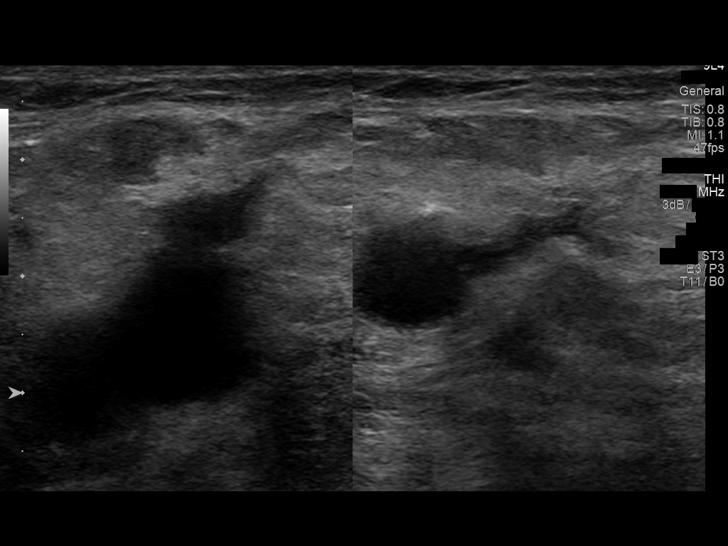
[im 6/66]
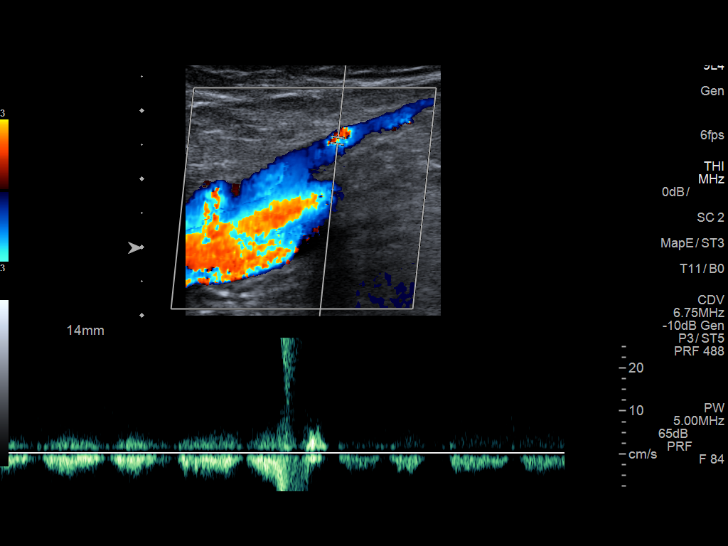
[im 12/66]
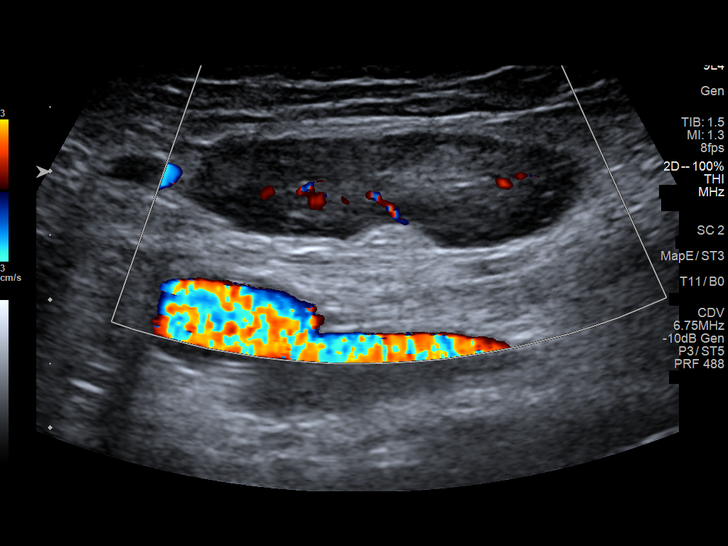
[im 17/66]
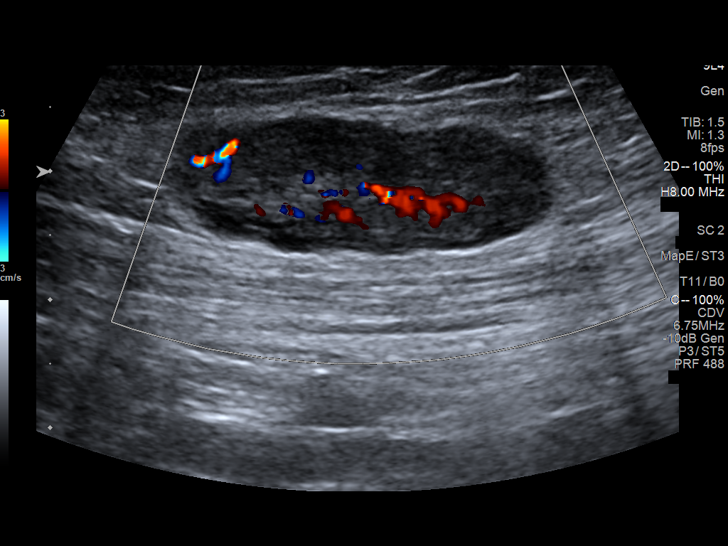
[im 20/66]
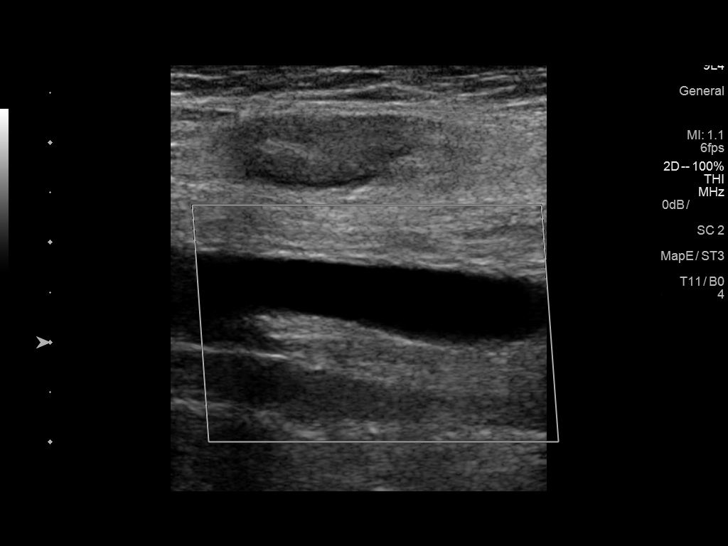
[im 26/66]
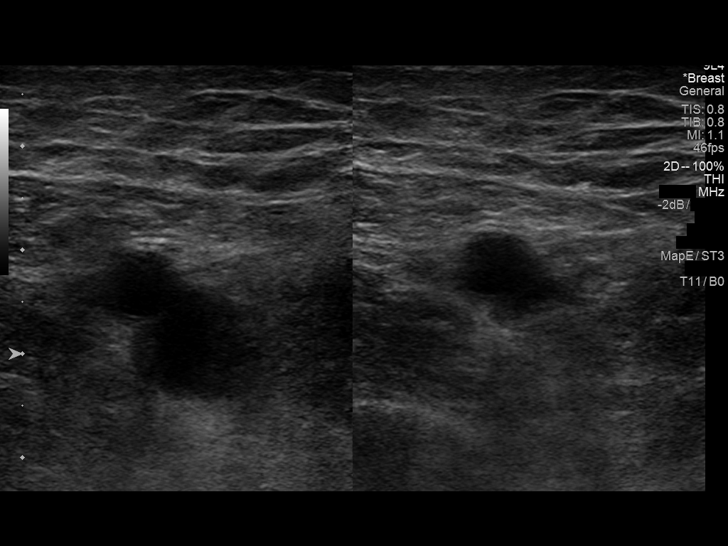
[im 32/66]
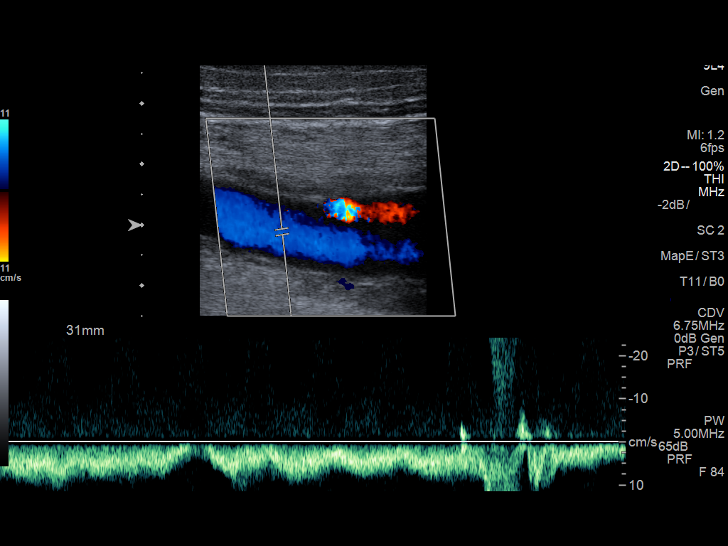
[im 34/66]
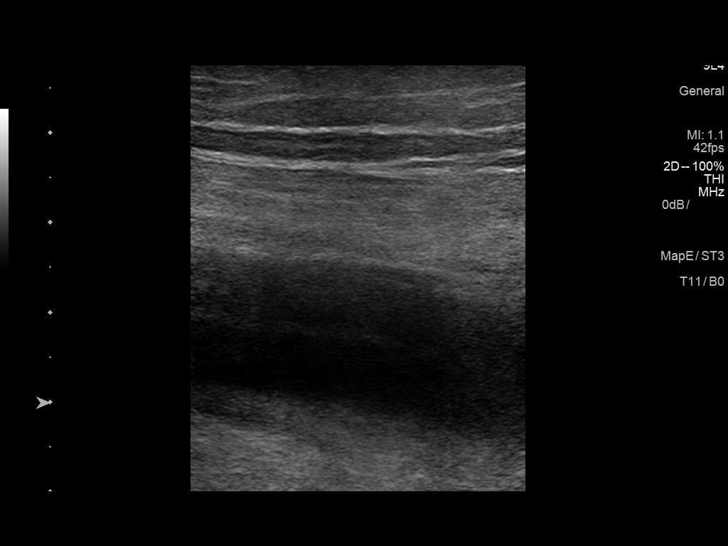
[im 40/66]
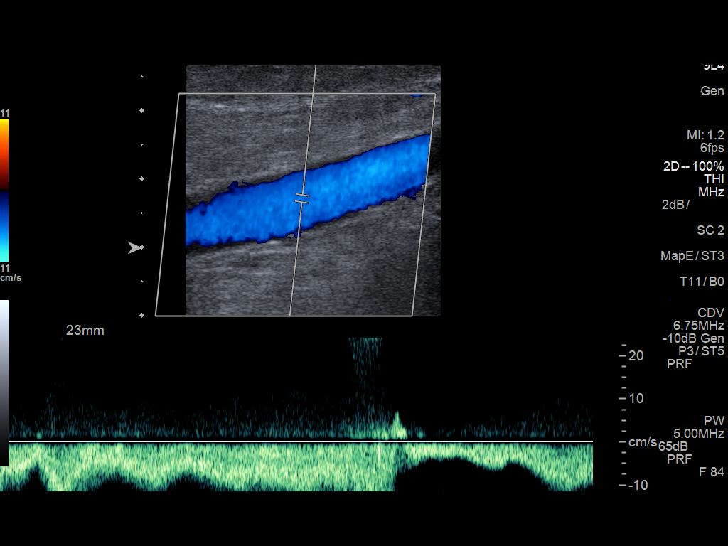
[im 46/66]
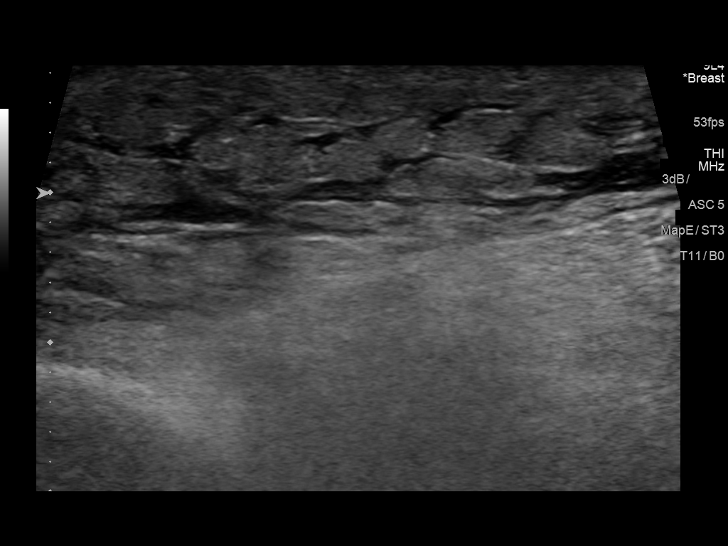
[im 51/66]
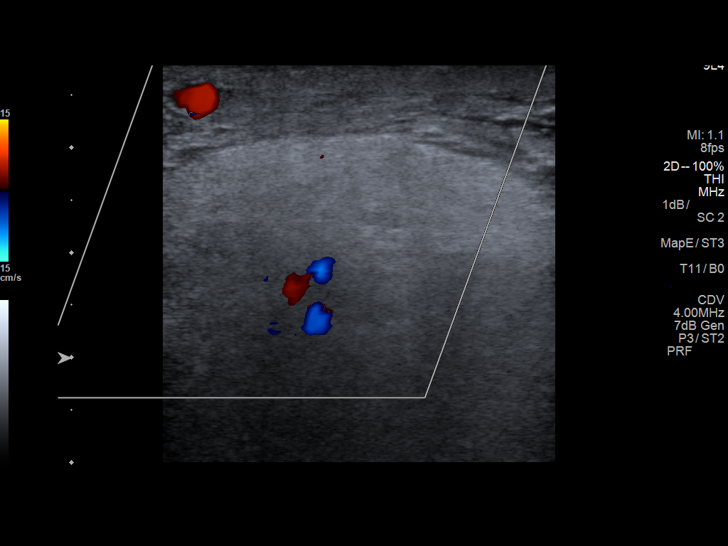
[im 54/66]
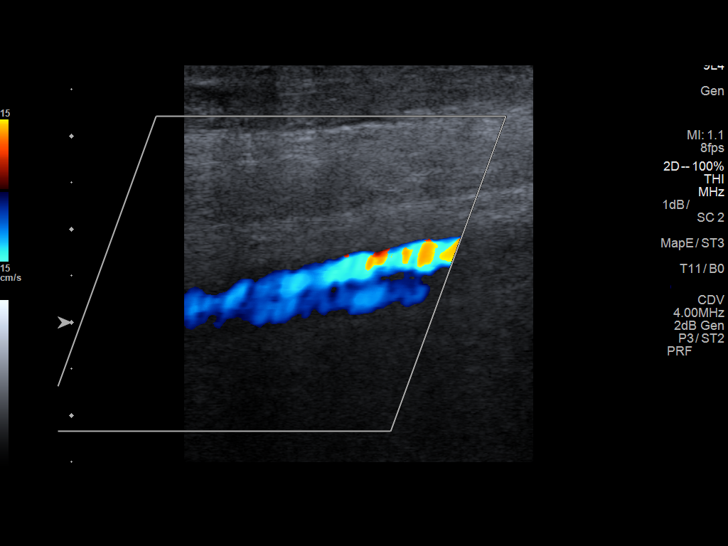
[im 60/66]
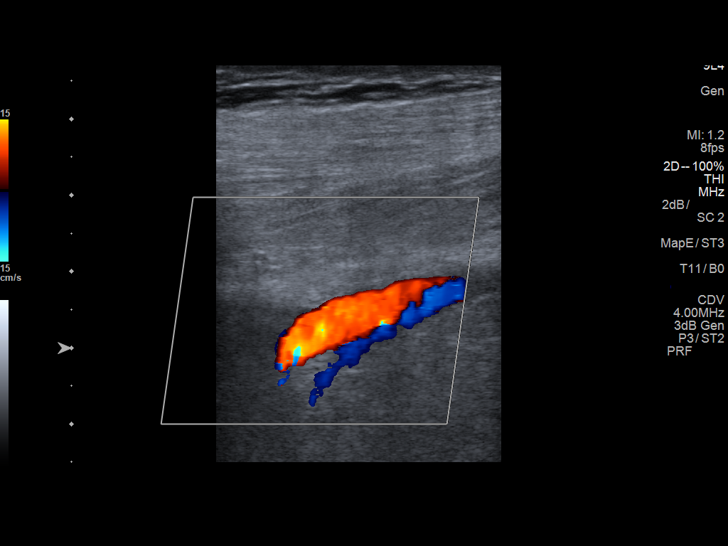
[im 66/66]
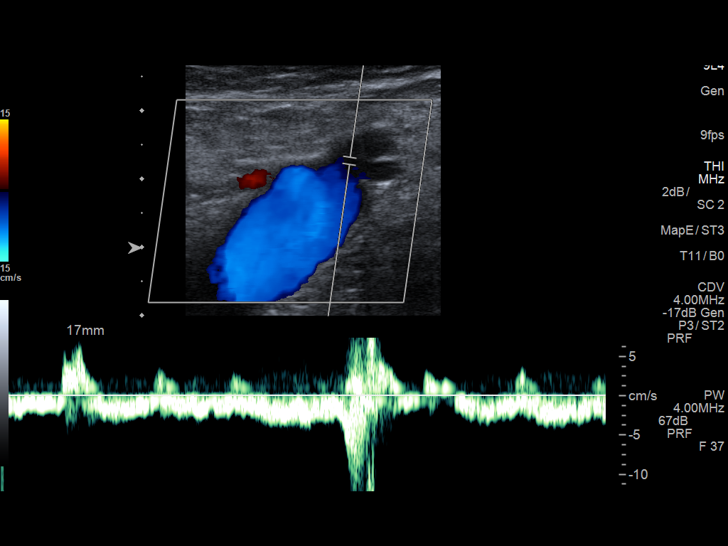

[14 of 24 positions shown; findings below may reference images not displayed]

FINDINGS: There is complete compressibility of the right common femoral,
femoral, and popliteal veins. Doppler analysis demonstrates
respiratory phasicity and augmentation of flow upon calf
compression.

Subcutaneous edema is present in the calf. No evidence of
superficial vein or calf vein thrombosis.

9 mm short axis diameter right inguinal node is demonstrated
IMPRESSION: No evidence of right lower extremity DVT.  No acute pathology.

## 2017-02-13 IMAGING — DX DG KNEE COMPLETE 4+V*R*
4 series · 4 of 4 positions shown · non-contrast
Comparison: December 03, 2013.

CLINICAL DATA: Acute right knee pain after twisting injury stepping
in hole. Initial encounter.

EXAM:
RIGHT KNEE - COMPLETE 4+ VIEW

[knee lat]
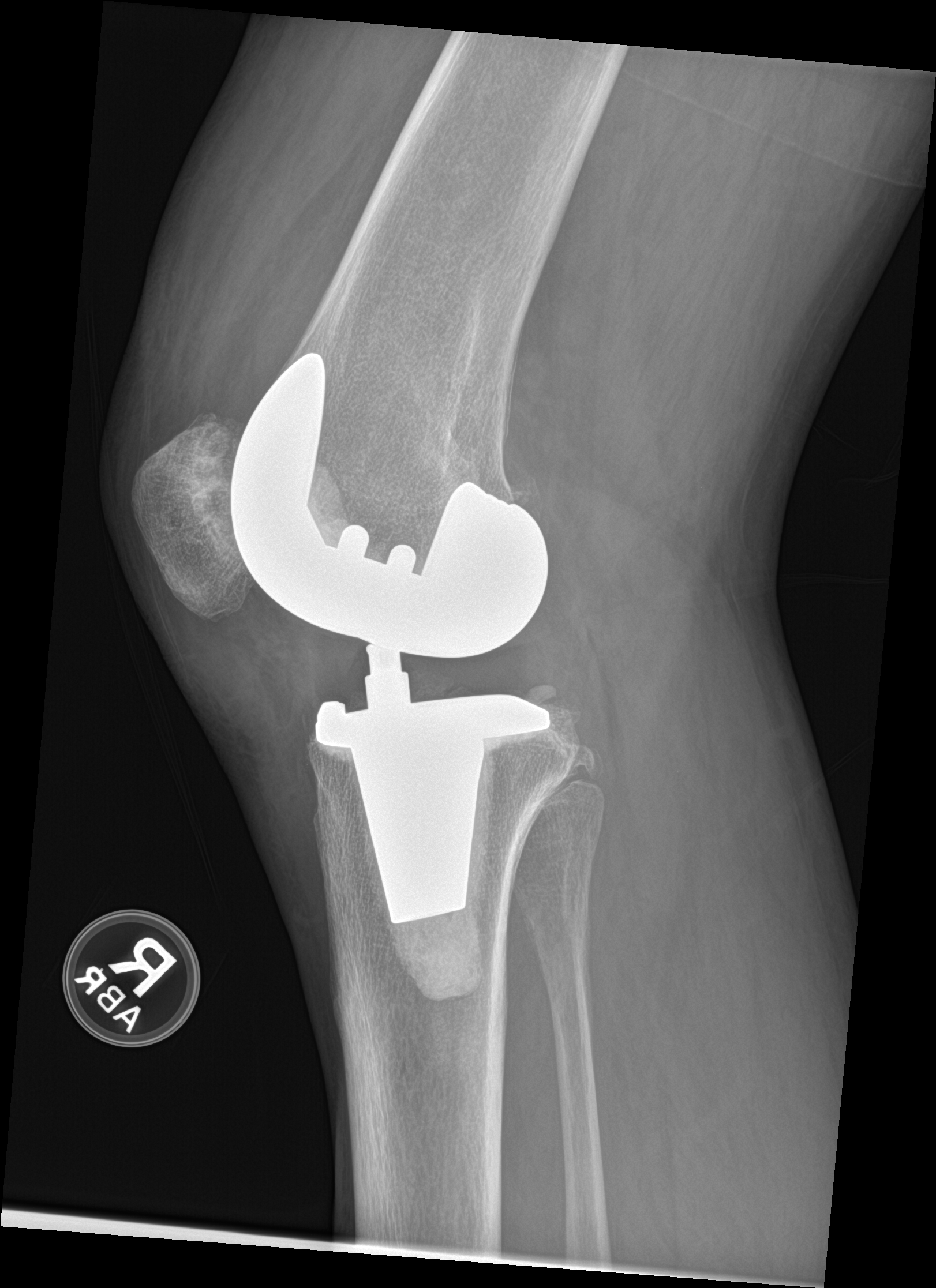

[knee ap (1 of 3)]
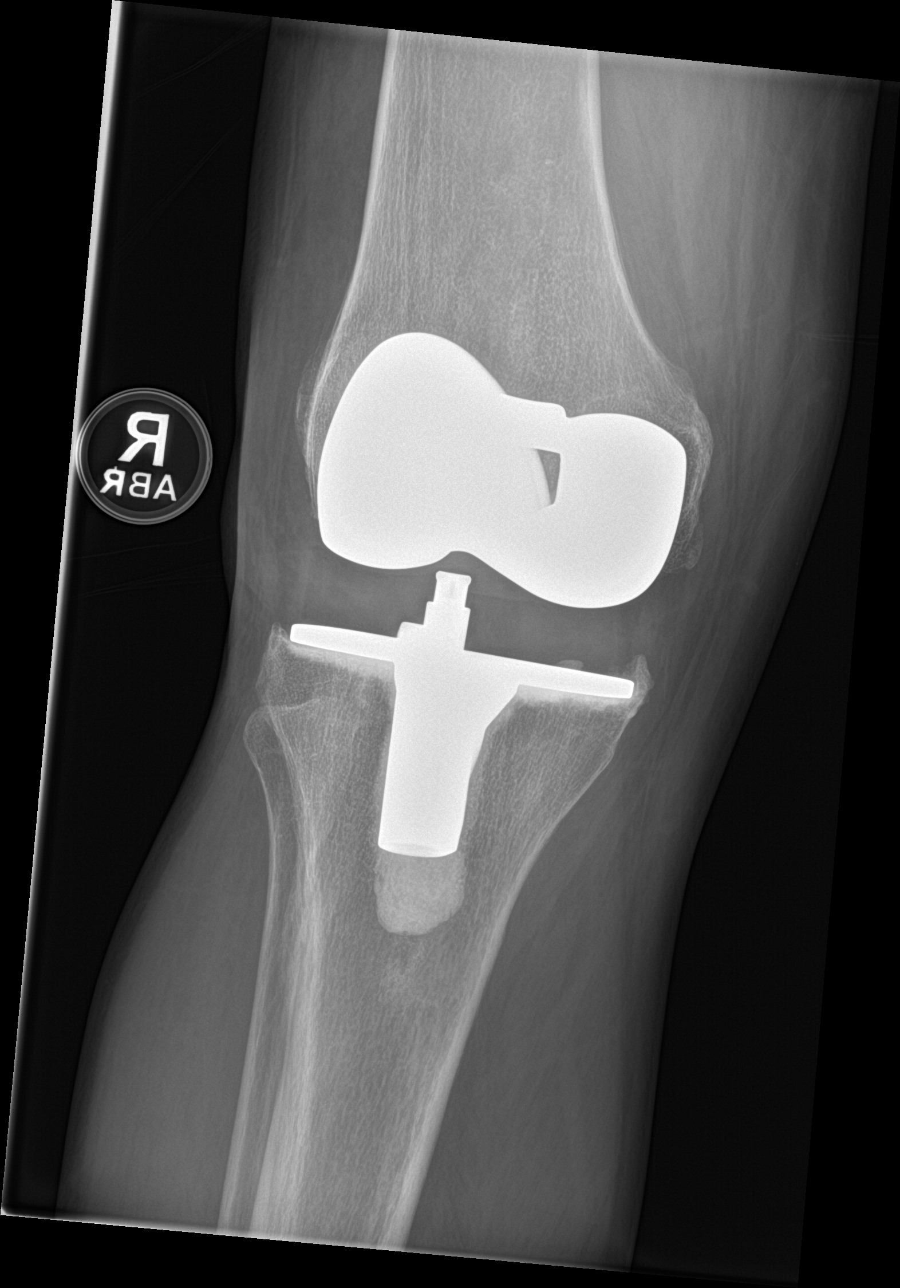

[knee ap (2 of 3)]
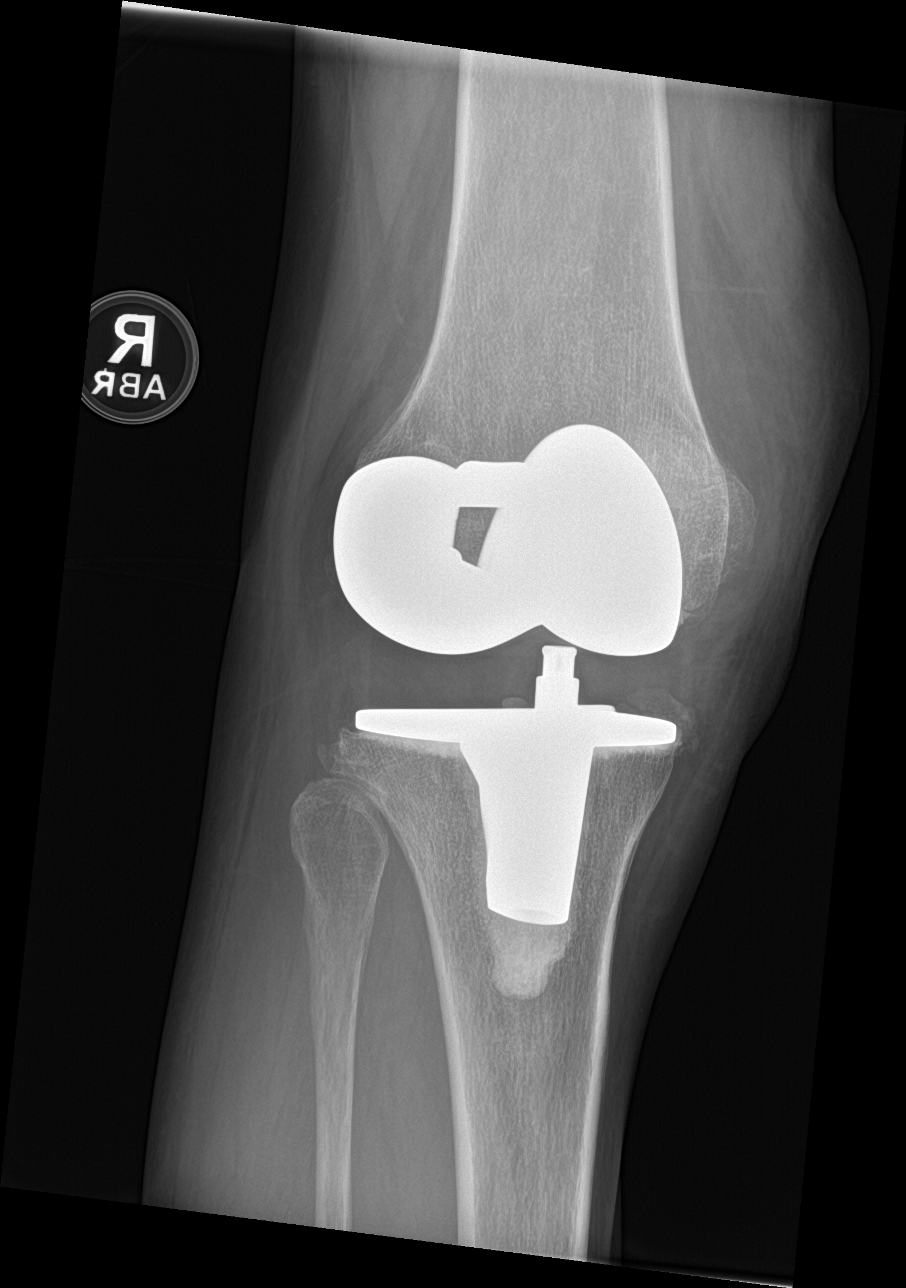

[knee ap (3 of 3)]
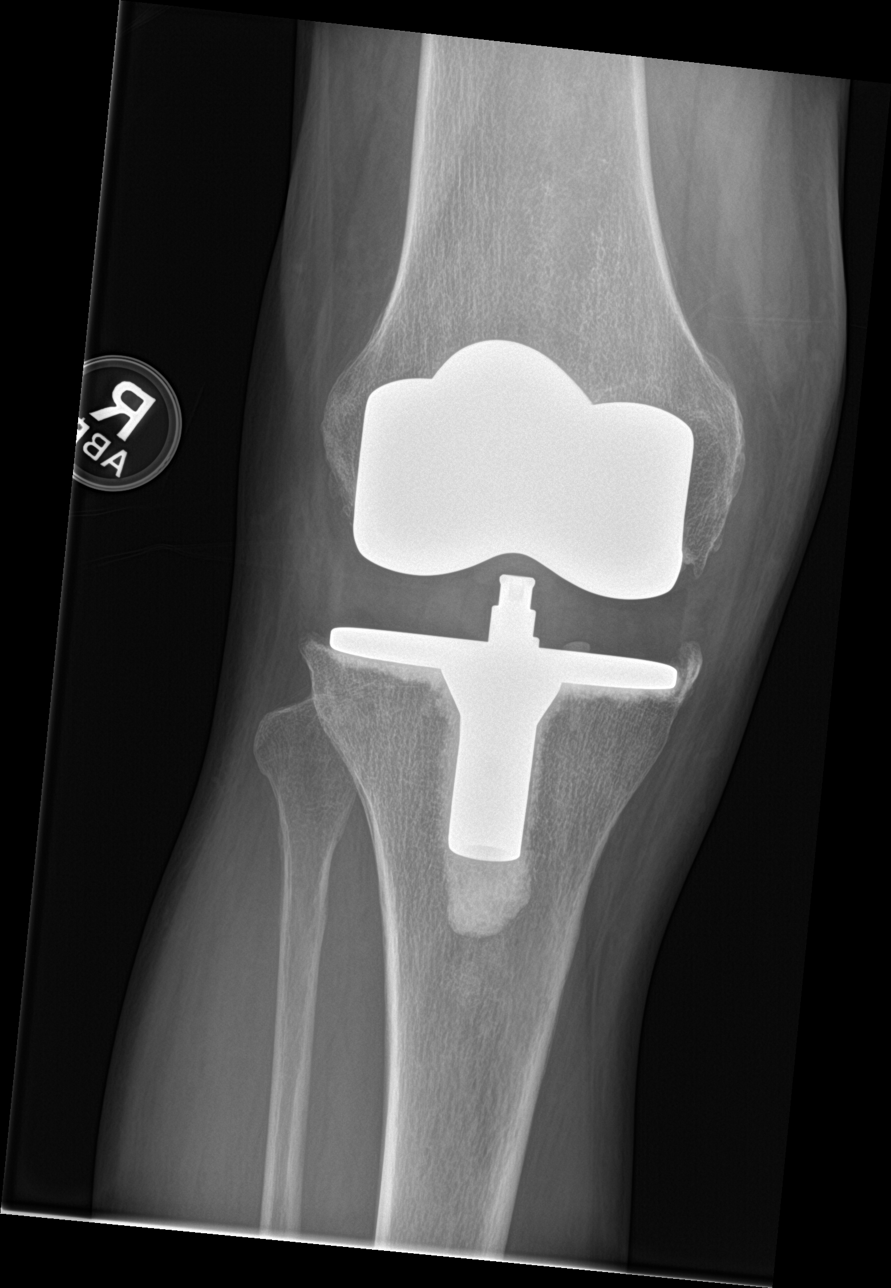

[4 of 4 positions shown; findings below may reference images not displayed]

FINDINGS: Status post right total knee arthroplasty. No fracture or
dislocation is noted. No significant joint effusion is noted.
IMPRESSION: No acute abnormality seen in the right knee.

## 2017-03-22 ENCOUNTER — Ambulatory Visit (INDEPENDENT_AMBULATORY_CARE_PROVIDER_SITE_OTHER): Payer: Medicare Other | Admitting: Orthopedic Surgery

## 2017-03-22 ENCOUNTER — Encounter (INDEPENDENT_AMBULATORY_CARE_PROVIDER_SITE_OTHER): Payer: Self-pay | Admitting: Orthopedic Surgery

## 2017-03-22 VITALS — Ht 75.0 in | Wt 200.0 lb

## 2017-03-22 DIAGNOSIS — Z794 Long term (current) use of insulin: Secondary | ICD-10-CM | POA: Diagnosis not present

## 2017-03-22 DIAGNOSIS — L97509 Non-pressure chronic ulcer of other part of unspecified foot with unspecified severity: Secondary | ICD-10-CM

## 2017-03-22 DIAGNOSIS — E08621 Diabetes mellitus due to underlying condition with foot ulcer: Secondary | ICD-10-CM

## 2017-03-22 NOTE — Progress Notes (Signed)
Office Visit Note   Patient: [name]           Date of Birth: Jul 15, 1965           MRN: [id number] Visit Date: 03/22/2017              Requested by: Lucia Gaskins, MD [address] [place], [place] [address] PCP: Lucia Gaskins, MD  Chief Complaint  Patient presents with  . Left Foot - Pain  . Right Foot - Open Wound      HPI: Patient is a 52 year old gentleman diabetic insensate neuropathy status post closing wedge osteotomy in the left to resolve a ulcer from a plantar flexed first ray on the left.  Patient states the ulcer healed completely very quickly.  Patient states he now has ulceration beneath the right great toe first metatarsal head with large draining ulcer and he would like to pursue similar procedure for the right foot.  Assessment & Plan: Visit Diagnoses:  1. Diabetes mellitus due to underlying condition with foot ulcer, with long-term current use of insulin ([place])     Plan: We will set him up for a dorsal closing wedge osteotomy for the first metatarsal on the right.  Risk and benefits were discussed including infection neurovascular injury need for additional surgery.  Patient states he understands wished to proceed at this time he states he cannot afford a kneeling scooter he has to go up and down stairs.  We will place a felt relieving donut to protect the wound at this time.  Follow-Up Instructions: Return in about 2 weeks (around 04/05/2017).   Ortho Exam  Patient is alert, oriented, no adenopathy, well-dressed, normal affect, normal respiratory effort. Examination patient has a good dorsalis pedis pulse bilaterally.  He has callus beneath the first metatarsal head on the left after informed consent a 10 blade knife was used to debride the skin and soft tissue back to healthy viable tissue silver nitrate was used for hemostasis the ulcer is 2 cm in diameter 1 mm deep and has good healthy tissue.  A Band-Aid was applied.  On the right foot patient has  a large area of nonviable tissue beneath the first metatarsal head on the right.  After informed consent a 10 blade knife was used to debride the skin and soft tissue back to bleeding viable granulation tissue this was touched with silver nitrate Band-Aids were applied and a felt relieving donut.  The ulcer is 2 cm in diameter and 5 mm deep.  Imaging: No results found. No images are attached to the encounter.  Labs: Lab Results  Component Value Date   HGBA1C 10.3 (H) 08/17/2016   HGBA1C 7.7 (H) 12/18/2013   HGBA1C 5.4 08/05/2011   ESRSEDRATE 110 (H) 12/19/2013   ESRSEDRATE 104 (H) 12/18/2013   ESRSEDRATE 1 02/11/2011   CRP 12.2 (H) 12/19/2013   CRP 14.9 (H) 12/18/2013   REPTSTATUS 12/24/2013 FINAL 12/18/2013   REPTSTATUS 12/24/2013 FINAL 12/18/2013   GRAMSTAIN  04/19/2011    WBC PRESENT,BOTH PMN AND MONONUCLEAR NO ORGANISMS SEEN   CULT  12/18/2013    NO GROWTH 5 DAYS Performed at [facility]  12/18/2013    NO GROWTH 5 DAYS Performed at [facility]     @LABSALLVALUES (HGBA1)@  Body mass index is 25 kg/m.  Orders:  No orders of the defined types were placed in this encounter.  No orders of the defined types were placed in this encounter.  Procedures: No procedures performed  Clinical Data: No additional findings.  ROS:  All other systems negative, except as noted in the HPI. Review of Systems  Objective: Vital Signs: Ht 6\' 3"  (1.905 m)   Wt 200 lb (90.7 kg)   BMI 25.00 kg/m   Specialty Comments:  No specialty comments available.  PMFS History: Patient Active Problem List   Diagnosis Date Noted  . SVT (supraventricular tachycardia) ([place]) 08/17/2016  . Tobacco use disorder 10/25/2014  . Diabetes mellitus type 2 in nonobese (HCC)   . Swelling   . Type 2 diabetes mellitus with left diabetic foot ulcer ([place]) 12/18/2013  . Diabetes mellitus due to underlying condition with foot ulcer ([place]) 12/18/2013  . Left leg cellulitis  12/18/2013  . Cellulitis of right lower extremity 07/19/2013  . Cocaine abuse ([place]) 03/29/2012  . Generalized anxiety disorder 03/29/2012  . Panic attacks 03/29/2012  . Alcohol dependence ([place]) 08/05/2011    Class: Chronic  . Substance abuse ([place]) 05/31/2011  . Foot ulcer, left ([place]) 02/15/2011  . Fasting hyperglycemia 02/15/2011  . Neuropathic ulcer of foot ([place]) 02/15/2011   Past Medical History:  Diagnosis Date  . Anxiety   . Arthritis    "everywhere" (04/21/2012)  . Cellulitis and abscess of foot 12/19/2013   LEFT FOOT  . Chronic pain   . DDD (degenerative disc disease)   . Depression   . Diabetes mellitus without complication (HCC)    borderline  . Diabetic foot ulcer ([place])   . ETOH abuse   . GERD (gastroesophageal reflux disease)    tums  . History of blood transfusion    "related to left knee OR; probably right hip too" (04/21/2012)  . Mental disorder   . Neuromuscular disorder (HCC)    neuropathy  . Noncompliance   . Open wound    bottom of foot  . Pneumonia ~ 2012  . Polysubstance abuse (HCC)    etoh, cocaine, heroin  . Stroke [facility]) 2008   "they said I might have had one during right hip replacement" (04/21/2012)    Family History  Problem Relation Age of Onset  . Diabetes Mother   . Diabetes Father     Past Surgical History:  Procedure Laterality Date  . JOINT REPLACEMENT    . KNEE ARTHROSCOPY Bilateral 1980's/1990's  . LUNG LOBECTOMY Left ~ 2006  . LUNG LOBECTOMY    . METATARSAL OSTEOTOMY  10/29/2011   Procedure: METATARSAL OSTEOTOMY;  Surgeon: [name], MD;  Location: [place];  Service: Orthopedics;  Laterality: Left;  Left 1st Metatarsal Dorsal Closing Wedge   . REVISION TOTAL HIP ARTHROPLASTY Right 2008   "4-5 months after replacement" (04/21/2012)  . TOTAL HIP ARTHROPLASTY Right 2008  . TOTAL KNEE ARTHROPLASTY Left 2006  . TOTAL KNEE ARTHROPLASTY Right 04/20/2012  . TOTAL KNEE ARTHROPLASTY Right 04/20/2012   Procedure: TOTAL KNEE ARTHROPLASTY;   Surgeon: [name], MD;  Location: [place];  Service: Orthopedics;  Laterality: Right;  Right Total Knee Arthroplasty   Social History   Occupational History  . Not on file  Tobacco Use  . Smoking status: Current Every Day Smoker    Packs/day: 1.00    Years: 30.00    Pack years: 30.00    Types: Cigarettes  . Smokeless tobacco: Never Used  Substance and Sexual Activity  . Alcohol use: No    Alcohol/week: 0.0 oz    Frequency: Never  . Drug use: No    Comment: Cocaine and heroin   .  Sexual activity: Yes    Birth control/protection: None

## 2017-03-23 ENCOUNTER — Telehealth (INDEPENDENT_AMBULATORY_CARE_PROVIDER_SITE_OTHER): Payer: Self-pay | Admitting: Orthopedic Surgery

## 2017-03-23 NOTE — Telephone Encounter (Signed)
Note was faxed.

## 2017-03-23 NOTE — Telephone Encounter (Signed)
Butch Penny with [facility] called needing the 03/22/17 note faxed to her. The fax# is [phone]  The ph# is [phone]

## 2017-04-05 ENCOUNTER — Ambulatory Visit (HOSPITAL_COMMUNITY)
Admission: RE | Admit: 2017-04-05 | Discharge: 2017-04-05 | Disposition: A | Discharge status: Home / Self Care | Payer: Medicare Other | Source: Ambulatory Visit | Attending: Family Medicine | Admitting: Family Medicine

## 2017-04-05 ENCOUNTER — Other Ambulatory Visit (HOSPITAL_COMMUNITY): Payer: Self-pay | Admitting: Family Medicine

## 2017-04-05 DIAGNOSIS — Z96653 Presence of artificial knee joint, bilateral: Secondary | ICD-10-CM

## 2017-04-05 DIAGNOSIS — Z9889 Other specified postprocedural states: Secondary | ICD-10-CM | POA: Insufficient documentation

## 2017-04-05 DIAGNOSIS — Z09 Encounter for follow-up examination after completed treatment for conditions other than malignant neoplasm: Secondary | ICD-10-CM | POA: Insufficient documentation

## 2017-04-16 ENCOUNTER — Other Ambulatory Visit (INDEPENDENT_AMBULATORY_CARE_PROVIDER_SITE_OTHER): Payer: Self-pay | Admitting: Orthopedic Surgery

## 2017-04-16 DIAGNOSIS — L97511 Non-pressure chronic ulcer of other part of right foot limited to breakdown of skin: Secondary | ICD-10-CM

## 2017-04-19 ENCOUNTER — Inpatient Hospital Stay (INDEPENDENT_AMBULATORY_CARE_PROVIDER_SITE_OTHER): Payer: Medicare Other | Admitting: Orthopedic Surgery

## 2017-04-26 ENCOUNTER — Encounter (HOSPITAL_COMMUNITY): Payer: Self-pay

## 2017-04-26 NOTE — Progress Notes (Signed)
Called and spoke with patient, pre-op instructions given verbally, patient given phone [phone] to call with any questions.

## 2017-04-28 ENCOUNTER — Other Ambulatory Visit: Payer: Self-pay

## 2017-04-28 ENCOUNTER — Encounter (HOSPITAL_COMMUNITY)
Admission: RE | Disposition: A | Discharge status: Home / Self Care | Payer: Self-pay | Source: Ambulatory Visit | Attending: Orthopedic Surgery

## 2017-04-28 ENCOUNTER — Encounter (HOSPITAL_COMMUNITY): Payer: Self-pay | Admitting: *Deleted

## 2017-04-28 ENCOUNTER — Ambulatory Visit (HOSPITAL_COMMUNITY)
Admission: RE | Admit: 2017-04-28 | Discharge: 2017-04-28 | Disposition: A | Discharge status: Home / Self Care | Payer: Medicare Other | Source: Ambulatory Visit | Attending: Orthopedic Surgery | Admitting: Orthopedic Surgery

## 2017-04-28 ENCOUNTER — Ambulatory Visit (HOSPITAL_COMMUNITY): Payer: Medicare Other | Admitting: Certified Registered"

## 2017-04-28 DIAGNOSIS — K219 Gastro-esophageal reflux disease without esophagitis: Secondary | ICD-10-CM | POA: Insufficient documentation

## 2017-04-28 DIAGNOSIS — E11621 Type 2 diabetes mellitus with foot ulcer: Secondary | ICD-10-CM | POA: Diagnosis not present

## 2017-04-28 DIAGNOSIS — Z7984 Long term (current) use of oral hypoglycemic drugs: Secondary | ICD-10-CM | POA: Diagnosis not present

## 2017-04-28 DIAGNOSIS — Z79899 Other long term (current) drug therapy: Secondary | ICD-10-CM | POA: Insufficient documentation

## 2017-04-28 DIAGNOSIS — M216X1 Other acquired deformities of right foot: Secondary | ICD-10-CM

## 2017-04-28 DIAGNOSIS — Z96641 Presence of right artificial hip joint: Secondary | ICD-10-CM | POA: Diagnosis not present

## 2017-04-28 DIAGNOSIS — L97418 Non-pressure chronic ulcer of right heel and midfoot with other specified severity: Secondary | ICD-10-CM | POA: Diagnosis present

## 2017-04-28 DIAGNOSIS — Z96653 Presence of artificial knee joint, bilateral: Secondary | ICD-10-CM | POA: Diagnosis not present

## 2017-04-28 DIAGNOSIS — Z888 Allergy status to other drugs, medicaments and biological substances status: Secondary | ICD-10-CM | POA: Diagnosis not present

## 2017-04-28 DIAGNOSIS — F419 Anxiety disorder, unspecified: Secondary | ICD-10-CM | POA: Diagnosis not present

## 2017-04-28 DIAGNOSIS — Z8673 Personal history of transient ischemic attack (TIA), and cerebral infarction without residual deficits: Secondary | ICD-10-CM | POA: Diagnosis not present

## 2017-04-28 DIAGNOSIS — M1991 Primary osteoarthritis, unspecified site: Secondary | ICD-10-CM | POA: Insufficient documentation

## 2017-04-28 DIAGNOSIS — L97511 Non-pressure chronic ulcer of other part of right foot limited to breakdown of skin: Secondary | ICD-10-CM

## 2017-04-28 DIAGNOSIS — F17210 Nicotine dependence, cigarettes, uncomplicated: Secondary | ICD-10-CM | POA: Insufficient documentation

## 2017-04-28 DIAGNOSIS — E1140 Type 2 diabetes mellitus with diabetic neuropathy, unspecified: Secondary | ICD-10-CM | POA: Diagnosis not present

## 2017-04-28 DIAGNOSIS — F329 Major depressive disorder, single episode, unspecified: Secondary | ICD-10-CM | POA: Insufficient documentation

## 2017-04-28 DIAGNOSIS — G8929 Other chronic pain: Secondary | ICD-10-CM | POA: Diagnosis not present

## 2017-04-28 DIAGNOSIS — Z833 Family history of diabetes mellitus: Secondary | ICD-10-CM | POA: Insufficient documentation

## 2017-04-28 DIAGNOSIS — M21271 Flexion deformity, right ankle and toes: Secondary | ICD-10-CM | POA: Diagnosis not present

## 2017-04-28 HISTORY — PX: METATARSAL OSTEOTOMY: SHX1641

## 2017-04-28 LAB — CBC
HEMATOCRIT: 44.7 % (ref 39.0–52.0)
Hemoglobin: 15.6 g/dL (ref 13.0–17.0)
MCH: 27.5 pg (ref 26.0–34.0)
MCHC: 34.9 g/dL (ref 30.0–36.0)
MCV: 78.7 fL (ref 78.0–100.0)
Platelets: 199 10*3/uL (ref 150–400)
RBC: 5.68 MIL/uL (ref 4.22–5.81)
RDW: 14.1 % (ref 11.5–15.5)
WBC: 13.2 10*3/uL — AB (ref 4.0–10.5)

## 2017-04-28 LAB — BASIC METABOLIC PANEL
ANION GAP: 14 (ref 5–15)
BUN: 6 mg/dL (ref 6–20)
CALCIUM: 9.8 mg/dL (ref 8.9–10.3)
CO2: 23 mmol/L (ref 22–32)
Chloride: 87 mmol/L — ABNORMAL LOW (ref 101–111)
Creatinine, Ser: 0.93 mg/dL (ref 0.61–1.24)
GFR calc Af Amer: >60 mL/min (ref >60)
GFR calc non Af Amer: >60 mL/min (ref >60)
GLUCOSE: 137 mg/dL — AB (ref 65–99)
Potassium: 3.4 mmol/L — ABNORMAL LOW (ref 3.5–5.1)
Sodium: 124 mmol/L — ABNORMAL LOW (ref 135–145)

## 2017-04-28 LAB — GLUCOSE, CAPILLARY
GLUCOSE-CAPILLARY: 136 mg/dL — AB (ref 65–99)
Glucose-Capillary: 162 mg/dL — ABNORMAL HIGH (ref 65–99)

## 2017-04-28 SURGERY — OSTEOTOMY, METATARSAL BONE
Anesthesia: General | Laterality: Right

## 2017-04-28 MED ORDER — PROPOFOL 10 MG/ML IV BOLUS
INTRAVENOUS | Status: AC
  Filled 2017-04-28: qty 20

## 2017-04-28 MED ORDER — 0.9 % SODIUM CHLORIDE (POUR BTL) OPTIME
TOPICAL | Status: DC | PRN
  Administered 2017-04-28: 1000 mL

## 2017-04-28 MED ORDER — HYDROMORPHONE HCL 1 MG/ML IJ SOLN
0.2500 mg | INTRAMUSCULAR | Status: DC | PRN
  Administered 2017-04-28 (×2): 0.5 mg via INTRAVENOUS

## 2017-04-28 MED ORDER — CHLORHEXIDINE GLUCONATE 4 % EX LIQD: 60.0000 mL | Freq: Once | CUTANEOUS | Status: DC

## 2017-04-28 MED ORDER — ONDANSETRON HCL 4 MG/2ML IJ SOLN
INTRAMUSCULAR | Status: DC | PRN
  Administered 2017-04-28: 4 mg via INTRAVENOUS

## 2017-04-28 MED ORDER — LIDOCAINE 2% (20 MG/ML) 5 ML SYRINGE
INTRAMUSCULAR | Status: AC
  Filled 2017-04-28: qty 5

## 2017-04-28 MED ORDER — CEFAZOLIN SODIUM-DEXTROSE 2-4 GM/100ML-% IV SOLN
INTRAVENOUS | Status: AC
  Filled 2017-04-28: qty 100

## 2017-04-28 MED ORDER — ONDANSETRON HCL 4 MG/2ML IJ SOLN
INTRAMUSCULAR | Status: AC
  Filled 2017-04-28: qty 2

## 2017-04-28 MED ORDER — OXYCODONE HCL 5 MG/5ML PO SOLN: 5.0000 mg | Freq: Once | ORAL | Status: AC | PRN

## 2017-04-28 MED ORDER — FENTANYL CITRATE (PF) 250 MCG/5ML IJ SOLN
INTRAMUSCULAR | Status: AC
  Filled 2017-04-28: qty 5

## 2017-04-28 MED ORDER — PHENYLEPHRINE 40 MCG/ML (10ML) SYRINGE FOR IV PUSH (FOR BLOOD PRESSURE SUPPORT)
PREFILLED_SYRINGE | INTRAVENOUS | Status: DC | PRN
  Administered 2017-04-28: 80 ug via INTRAVENOUS
  Administered 2017-04-28: 40 ug via INTRAVENOUS
  Administered 2017-04-28 (×4): 80 ug via INTRAVENOUS

## 2017-04-28 MED ORDER — LACTATED RINGERS IV SOLN
INTRAVENOUS | Status: DC | PRN
  Administered 2017-04-28 (×2): via INTRAVENOUS

## 2017-04-28 MED ORDER — PHENYLEPHRINE 40 MCG/ML (10ML) SYRINGE FOR IV PUSH (FOR BLOOD PRESSURE SUPPORT)
PREFILLED_SYRINGE | INTRAVENOUS | Status: AC
  Filled 2017-04-28: qty 30

## 2017-04-28 MED ORDER — DEXAMETHASONE SODIUM PHOSPHATE 10 MG/ML IJ SOLN
INTRAMUSCULAR | Status: AC
Start: 2017-04-28 — End: ?
  Filled 2017-04-28: qty 1

## 2017-04-28 MED ORDER — FENTANYL CITRATE (PF) 250 MCG/5ML IJ SOLN
INTRAMUSCULAR | Status: DC | PRN
  Administered 2017-04-28: 25 ug via INTRAVENOUS
  Administered 2017-04-28: 75 ug via INTRAVENOUS

## 2017-04-28 MED ORDER — OXYCODONE-ACETAMINOPHEN 5-325 MG PO TABS: 1.0000 | ORAL_TABLET | ORAL | 0 refills | Status: DC | PRN

## 2017-04-28 MED ORDER — MIDAZOLAM HCL 5 MG/5ML IJ SOLN
INTRAMUSCULAR | Status: DC | PRN
  Administered 2017-04-28: 2 mg via INTRAVENOUS

## 2017-04-28 MED ORDER — OXYCODONE HCL 5 MG PO TABS
5.0000 mg | ORAL_TABLET | Freq: Once | ORAL | Status: AC | PRN
  Administered 2017-04-28: 5 mg via ORAL

## 2017-04-28 MED ORDER — PROPOFOL 10 MG/ML IV BOLUS
INTRAVENOUS | Status: DC | PRN
  Administered 2017-04-28: 140 mg via INTRAVENOUS

## 2017-04-28 MED ORDER — CEFAZOLIN SODIUM-DEXTROSE 2-4 GM/100ML-% IV SOLN
2.0000 g | INTRAVENOUS | Status: AC
  Administered 2017-04-28: 2 g via INTRAVENOUS

## 2017-04-28 MED ORDER — HYDROMORPHONE HCL 1 MG/ML IJ SOLN
INTRAMUSCULAR | Status: AC
  Administered 2017-04-28: 0.5 mg via INTRAVENOUS
  Filled 2017-04-28: qty 1

## 2017-04-28 MED ORDER — OXYCODONE HCL 5 MG PO TABS
ORAL_TABLET | ORAL | Status: AC
  Filled 2017-04-28: qty 1

## 2017-04-28 MED ORDER — PHENYLEPHRINE 40 MCG/ML (10ML) SYRINGE FOR IV PUSH (FOR BLOOD PRESSURE SUPPORT)
PREFILLED_SYRINGE | INTRAVENOUS | Status: AC
  Filled 2017-04-28: qty 10

## 2017-04-28 MED ORDER — DEXAMETHASONE SODIUM PHOSPHATE 10 MG/ML IJ SOLN
INTRAMUSCULAR | Status: DC | PRN
  Administered 2017-04-28: 5 mg via INTRAVENOUS

## 2017-04-28 MED ORDER — MIDAZOLAM HCL 2 MG/2ML IJ SOLN
INTRAMUSCULAR | Status: AC
  Filled 2017-04-28: qty 2

## 2017-04-28 MED ORDER — LIDOCAINE HCL (CARDIAC) 20 MG/ML IV SOLN
INTRAVENOUS | Status: DC | PRN
  Administered 2017-04-28: 40 mg via INTRAVENOUS

## 2017-04-28 SURGICAL SUPPLY — 56 items
BIT DRILL KIT 2.65MM (BIT) IMPLANT
BLADE AVERAGE 25MMX9MM (BLADE)
BLADE AVERAGE 25X9 (BLADE) IMPLANT
BNDG CMPR 9X4 STRL LF SNTH (GAUZE/BANDAGES/DRESSINGS) ×1
BNDG COHESIVE 1X5 TAN STRL LF (GAUZE/BANDAGES/DRESSINGS) IMPLANT
BNDG COHESIVE 4X5 TAN STRL (GAUZE/BANDAGES/DRESSINGS) ×2 IMPLANT
BNDG COHESIVE 6X5 TAN STRL LF (GAUZE/BANDAGES/DRESSINGS) IMPLANT
BNDG ESMARK 4X9 LF (GAUZE/BANDAGES/DRESSINGS) ×3 IMPLANT
BNDG GAUZE STRTCH 6 (GAUZE/BANDAGES/DRESSINGS) IMPLANT
CORDS BIPOLAR (ELECTRODE) ×3 IMPLANT
COTTON STERILE ROLL (GAUZE/BANDAGES/DRESSINGS) IMPLANT
COVER SURGICAL LIGHT HANDLE (MISCELLANEOUS) ×4 IMPLANT
CUFF TOURNIQUET SINGLE 24IN (TOURNIQUET CUFF) IMPLANT
CUFF TOURNIQUET SINGLE 34IN LL (TOURNIQUET CUFF) IMPLANT
CUFF TOURNIQUET SINGLE 44IN (TOURNIQUET CUFF) IMPLANT
DRAPE OEC MINIVIEW 54X84 (DRAPES) ×2 IMPLANT
DRAPE U-SHAPE 47X51 STRL (DRAPES) ×3 IMPLANT
DRILL KIT 2.65MM (BIT) ×3
DRSG ADAPTIC 3X8 NADH LF (GAUZE/BANDAGES/DRESSINGS) ×2 IMPLANT
DURAPREP 26ML APPLICATOR (WOUND CARE) ×3 IMPLANT
ELECT REM PT RETURN 9FT ADLT (ELECTROSURGICAL) ×3
ELECTRODE REM PT RTRN 9FT ADLT (ELECTROSURGICAL) ×1 IMPLANT
GAUZE SPONGE 2X2 8PLY STRL LF (GAUZE/BANDAGES/DRESSINGS) IMPLANT
GAUZE SPONGE 4X4 12PLY STRL (GAUZE/BANDAGES/DRESSINGS) IMPLANT
GAUZE SPONGE 4X4 12PLY STRL LF (GAUZE/BANDAGES/DRESSINGS) ×2 IMPLANT
GLOVE BIOGEL PI IND STRL 9 (GLOVE) ×1 IMPLANT
GLOVE BIOGEL PI INDICATOR 9 (GLOVE) ×2
GLOVE SURG ORTHO 9.0 STRL STRW (GLOVE) ×3 IMPLANT
GOWN STRL REUS W/ TWL XL LVL3 (GOWN DISPOSABLE) ×2 IMPLANT
GOWN STRL REUS W/TWL XL LVL3 (GOWN DISPOSABLE) ×6
GUIDEWIRE NON THREAD 1.6MM (WIRE) ×2 IMPLANT
IMPL SPEED TITAN 20X20X20 (Orthopedic Implant) IMPLANT
IMPLANT SPEED TITAN 20X20X20 (Orthopedic Implant) ×6 IMPLANT
KIT BASIN OR (CUSTOM PROCEDURE TRAY) ×3 IMPLANT
KIT TURNOVER KIT B (KITS) ×3 IMPLANT
MANIFOLD NEPTUNE II (INSTRUMENTS) ×1 IMPLANT
NDL HYPO 25GX1X1/2 BEV (NEEDLE) IMPLANT
NEEDLE HYPO 25GX1X1/2 BEV (NEEDLE) IMPLANT
NS IRRIG 1000ML POUR BTL (IV SOLUTION) ×3 IMPLANT
PACK ORTHO EXTREMITY (CUSTOM PROCEDURE TRAY) ×3 IMPLANT
PAD ARMBOARD 7.5X6 YLW CONV (MISCELLANEOUS) ×6 IMPLANT
PAD CAST 4YDX4 CTTN HI CHSV (CAST SUPPLIES) IMPLANT
PADDING CAST COTTON 4X4 STRL (CAST SUPPLIES) ×3
SCREW HEADLESS COMP 4.5X42 (Screw) ×2 IMPLANT
SPONGE GAUZE 2X2 STER 10/PKG (GAUZE/BANDAGES/DRESSINGS)
SUCTION FRAZIER HANDLE 10FR (MISCELLANEOUS)
SUCTION TUBE FRAZIER 10FR DISP (MISCELLANEOUS) IMPLANT
SUT ETHILON 2 0 PSLX (SUTURE) ×6 IMPLANT
SUT VIC AB 2-0 CT1 27 (SUTURE) ×3
SUT VIC AB 2-0 CT1 TAPERPNT 27 (SUTURE) ×1 IMPLANT
SYR CONTROL 10ML LL (SYRINGE) IMPLANT
TOWEL OR 17X24 6PK STRL BLUE (TOWEL DISPOSABLE) ×3 IMPLANT
TOWEL OR 17X26 10 PK STRL BLUE (TOWEL DISPOSABLE) ×3 IMPLANT
TUBE CONNECTING 12'X1/4 (SUCTIONS)
TUBE CONNECTING 12X1/4 (SUCTIONS) IMPLANT
WATER STERILE IRR 1000ML POUR (IV SOLUTION) ×1 IMPLANT

## 2017-04-28 NOTE — Transfer of Care (Signed)
Immediate Anesthesia Transfer of Care Note  Patient: [name]  Procedure(s) Performed: RIGHT 1ST METATARSAL DORSAL CLOSING WEDGE OSTEOTOMY (Right )  Patient Location: PACU  Anesthesia Type:General  Level of Consciousness: awake, alert  and patient cooperative  Airway & Oxygen Therapy: Patient Spontanous Breathing and Patient connected to face mask oxygen  Post-op Assessment: Report given to RN and Post -op Vital signs reviewed and stable  Post vital signs: Reviewed and stable  Last Vitals:  Vitals Value Taken Time  BP 152/76 04/28/2017  9:24 AM  Temp 36.4 C 04/28/2017  9:23 AM  Pulse 82 04/28/2017  9:26 AM  Resp 19 04/28/2017  9:26 AM  SpO2 100 % 04/28/2017  9:26 AM  Vitals shown include unvalidated device data.  Last Pain:  Vitals:   04/28/17 0730  TempSrc:   PainSc: 8       Patients Stated Pain Goal: 5 (37/36/68 1594)  Complications: No apparent anesthesia complications

## 2017-04-28 NOTE — Anesthesia Procedure Notes (Signed)
Procedure Name: LMA Insertion Date/Time: 04/28/2017 9:38 AM Performed by: Freddie Breech, CRNA Pre-anesthesia Checklist: Patient identified, Emergency Drugs available, Suction available and Patient being monitored Patient Re-evaluated:Patient Re-evaluated prior to induction Oxygen Delivery Method: Circle System Utilized Preoxygenation: Pre-oxygenation with 100% oxygen Induction Type: IV induction Ventilation: Mask ventilation without difficulty LMA: LMA inserted LMA Size: 5.0 Number of attempts: 1 Airway Equipment and Method: Bite block Placement Confirmation: positive ETCO2 Tube secured with: Tape Dental Injury: Teeth and Oropharynx as per pre-operative assessment

## 2017-04-28 NOTE — Op Note (Signed)
04/28/2017  9:24 AM  PATIENT:  [name]    PRE-OPERATIVE DIAGNOSIS:  PLANTAR FLEXED 1st RAY WITH ULCER RIGHT FOOT  POST-OPERATIVE DIAGNOSIS:  Same  PROCEDURE:  RIGHT 1ST METATARSAL DORSAL CLOSING WEDGE OSTEOTOMY, fusion across the base of the first metatarsal medial cuneiform with 2 staples, debridement skin and soft tissue with a 10 blade knife of the [place] grade 1 ulcer first metatarsal head.  SURGEON:  [name], MD  PHYSICIAN ASSISTANT:None ANESTHESIA:   General  PREOPERATIVE INDICATIONS:  [name] is a  52 y.o. male with a diagnosis of PLANTAR FLEXED 1st RAY WITH ULCER RIGHT FOOT who failed conservative measures and elected for surgical management.    The risks benefits and alternatives were discussed with the patient preoperatively including but not limited to the risks of infection, bleeding, nerve injury, cardiopulmonary complications, the need for revision surgery, among others, and the patient was willing to proceed.  OPERATIVE IMPLANTS: Synthes staples with a 20 x 20 mm x2   @ENCIMAGES @  OPERATIVE FINDINGS: Ulceration first metatarsal head did not extend down to bone or tendon.  Patient had soft bone that required staples instead of a intramedullary screw.  OPERATIVE PROCEDURE: Dyspnea patient was brought the operating room and underwent a general anesthetic.  After adequate levels of anesthesia were obtained patient's right lower extremity was prepped using DuraPrep draped into a sterile field a timeout was called.  A dorsal incision was made over the EHL.  The EHL was retracted medially.  Retractors were placed around the first metatarsal he has had a and a wedge osteotomy was resected from the base of the first metatarsal head including the joint of the medial  to get out a little bit more found cuneiform.  A closing wedge probably stop it closure was performed this was then stabilized with a K wire C-arm fluoroscopy verified alignment a 4.5 headless cannulated  screw was placed and this broke through the bone of the base of the first metatarsal.  This was removed staples were then used and 2 staples at 90 degrees were placed to secure the base of the first metatarsal medial cuneiform and to close the wedge.  This level of the plantar aspect of the foot.  This was irrigated with normal saline and the incision was closed using 2-0 nylon.  Attention was then focused on the base of the first metatarsal.  A 10 blade knife was used to debride the skin and soft tissue back to bleeding viable granulation tissue.  The ulcer was 2 cm in diameter 3 mm deep and extended down to good healthy granulation tissue no exposed bone or tendon.  Adaptic was applied a sterile compressive dressing was applied patient was extubated taken the PACU in stable condition.   DISCHARGE PLANNING:  Antibiotic duration:periop  Weightbearing: NWB right  Pain medication: percocet  Dressing care/ Wound PFX:TKWI dressing dry  Ambulatory devices:walker  Discharge to: home  Follow-up: In the office 1 week post operative.

## 2017-04-28 NOTE — Anesthesia Postprocedure Evaluation (Signed)
Anesthesia Post Note  Patient: Thierno Hun  Procedure(s) Performed: RIGHT 1ST METATARSAL DORSAL CLOSING WEDGE OSTEOTOMY (Right )     Patient location during evaluation: PACU Anesthesia Type: General Level of consciousness: awake and alert Pain management: pain level controlled Vital Signs Assessment: post-procedure vital signs reviewed and stable Respiratory status: spontaneous breathing, nonlabored ventilation, respiratory function stable and patient connected to nasal cannula oxygen Cardiovascular status: blood pressure returned to baseline and stable Postop Assessment: no apparent nausea or vomiting Anesthetic complications: no    Last Vitals:  Vitals:   04/28/17 1028 04/28/17 1029  BP: 119/76 119/76  Pulse: 74 72  Resp: 17 14  Temp: 36.7 C   SpO2: 98% 100%    Last Pain:  Vitals:   04/28/17 1000  TempSrc:   PainSc: 8         RLE Motor Response: Purposeful movement;Responds to commands (04/28/17 1015) RLE Sensation: Numbness (04/28/17 1015)      Lemon Sternberg,JAMES TERRILL

## 2017-04-28 NOTE — H&P (Signed)
[name] is an 52 y.o. male.   Chief Complaint: Chronic ulcer beneath the first metatarsal head right foot HPI: Patient is a 52 year old gentleman diabetic insensate neuropathy status post closing wedge osteotomy in the left to resolve a ulcer from a plantar flexed first ray on the left.  Patient states the ulcer healed completely very quickly.  Patient states he now has ulceration beneath the right great toe first metatarsal head with large draining ulcer and he would like to pursue similar procedure for the right foot.    Past Medical History:  Diagnosis Date  . Anxiety   . Arthritis    "everywhere" (04/21/2012)  . Cellulitis and abscess of foot 12/19/2013   LEFT FOOT  . Chronic pain   . DDD (degenerative disc disease)   . Depression   . Diabetes mellitus without complication (HCC)    borderline  . Diabetic foot ulcer ([place])   . ETOH abuse   . GERD (gastroesophageal reflux disease)    tums  . History of blood transfusion    "related to left knee OR; probably right hip too" (04/21/2012)  . Mental disorder   . Neuromuscular disorder (HCC)    neuropathy  . Noncompliance   . Open wound    bottom of foot  . Pneumonia ~ 2012  . Polysubstance abuse (HCC)    etoh, cocaine, heroin  . Stroke [facility]) 2008   "they said I might have had one during right hip replacement" (04/21/2012)    Past Surgical History:  Procedure Laterality Date  . JOINT REPLACEMENT    . KNEE ARTHROSCOPY Bilateral 1980's/1990's  . LUNG LOBECTOMY Left ~ 2006  . LUNG LOBECTOMY    . METATARSAL OSTEOTOMY  10/29/2011   Procedure: METATARSAL OSTEOTOMY;  Surgeon: [name], MD;  Location: [place];  Service: Orthopedics;  Laterality: Left;  Left 1st Metatarsal Dorsal Closing Wedge   . REVISION TOTAL HIP ARTHROPLASTY Right 2008   "4-5 months after replacement" (04/21/2012)  . TOTAL HIP ARTHROPLASTY Right 2008  . TOTAL KNEE ARTHROPLASTY Left 2006  . TOTAL KNEE ARTHROPLASTY Right 04/20/2012  . TOTAL KNEE ARTHROPLASTY Right  04/20/2012   Procedure: TOTAL KNEE ARTHROPLASTY;  Surgeon: [name], MD;  Location: [place];  Service: Orthopedics;  Laterality: Right;  Right Total Knee Arthroplasty    Family History  Problem Relation Age of Onset  . Diabetes Mother   . Diabetes Father    Social History:  reports that he has been smoking cigarettes.  He has a 30.00 pack-year smoking history. He has never used smokeless tobacco. He reports that he does not drink alcohol or use drugs.  Allergies:  Allergies  Allergen Reactions  . Benadryl [Diphenhydramine Hcl] Other (See Comments)    Leg spasms   . Trazodone And Nefazodone Other (See Comments)    Leg spasms     Medications Prior to Admission  Medication Sig Dispense Refill  . Cholecalciferol (VITAMIN D3) 5000 units CAPS Take 10,000 Units by mouth daily.    . dapagliflozin propanediol (FARXIGA) 10 MG TABS tablet Take 10 mg by mouth daily.    Marland Kitchen diltiazem (CARDIZEM) 30 MG tablet Take 1 tablet (30 mg total) by mouth 2 (two) times daily. 180 tablet 3  . ibuprofen (ADVIL,MOTRIN) 200 MG tablet Take 400 mg by mouth every 6 (six) hours as needed for headache or moderate pain.    Marland Kitchen lisinopril (PRINIVIL,ZESTRIL) 20 MG tablet Take 20 mg by mouth daily.    . metFORMIN (GLUCOPHAGE) 500  MG tablet Take 1 tablet (500 mg total) by mouth 2 (two) times daily with a meal. (Patient taking differently: Take 1,000 mg by mouth 2 (two) times daily with a meal. ) 180 tablet 0  . naproxen (NAPROSYN) 500 MG tablet Take 500 mg by mouth 2 (two) times daily.  0  . oxyCODONE (OXY IR/ROXICODONE) 5 MG immediate release tablet Take 5 mg by mouth 3 (three) times daily.    . rosuvastatin (CRESTOR) 20 MG tablet Take 20 mg by mouth daily.    . blood glucose meter kit and supplies KIT Dispense based on patient and insurance preference. Use up to four times daily as directed. (FOR ICD-9 250.00, 250.01). (Patient not taking: Reported on 04/21/2017) 1 each 0  . gabapentin (NEURONTIN) 400 MG capsule Take 1  capsule (400 mg total) by mouth 4 (four) times daily. For substance withdrawal syndrome (Patient not taking: Reported on 04/21/2017) 120 capsule 0  . oxyCODONE-acetaminophen (PERCOCET/ROXICET) 5-325 MG tablet Take 1 tablet by mouth every 6 (six) hours as needed. (Patient not taking: Reported on 04/21/2017) 20 tablet 0  . QUEtiapine (SEROQUEL) 200 MG tablet Take 1 tablet (200 mg total) by mouth at bedtime. For mood control (Patient not taking: Reported on 04/21/2017) 30 tablet 0  . thiamine 100 MG tablet Take 1 tablet (100 mg total) by mouth daily. (Patient not taking: Reported on 04/21/2017) 30 tablet 1  . traMADol (ULTRAM) 50 MG tablet Take 1 tablet (50 mg total) by mouth every 6 (six) hours as needed. (Patient not taking: Reported on 04/21/2017) 30 tablet 0    No results found for this or any previous visit (from the past 48 hour(s)). No results found.  Review of Systems  All other systems reviewed and are negative.   There were no vitals taken for this visit. Physical Exam  Examination patient is alert oriented no adenopathy well-dressed normal affect normal respiratory effort he has an antalgic gait.  Patient has a palpable pulse.  There is an ulcer beneath the right first metatarsal head which is 2 cm in diameter he has a plantar flexed first ray. Assessment/Plan Assessment: Diabetic insensate neuropathy with plantar flexed first ray with chronic ulcer beneath the first metatarsal head.  Plan: We will plan for dorsal closing wedge osteotomy.  Patient is undergone prolonged conservative therapy with debridement pressure unloading without relief and patient wished to proceed with surgery at this time risk and benefits were discussed patient states he understands and wishes to proceed.  [name], MD 04/28/2017, 6:27 AM

## 2017-04-28 NOTE — Anesthesia Preprocedure Evaluation (Addendum)
Anesthesia Evaluation  Patient identified by MRN, date of birth, ID band Patient awake    Reviewed: Allergy & Precautions, NPO status   Airway Mallampati: II  TM Distance: >3 FB Neck ROM: Full    Dental no notable dental hx.    Pulmonary Current Smoker,    breath sounds clear to auscultation       Cardiovascular negative cardio ROS   Rhythm:Regular Rate:Normal     Neuro/Psych CVA    GI/Hepatic GERD  ,(+)     substance abuse  alcohol use and cocaine use,   Endo/Other  diabetes  Renal/GU      Musculoskeletal  (+) Arthritis ,   Abdominal   Peds  Hematology   Anesthesia Other Findings   Reproductive/Obstetrics                            Anesthesia Physical Anesthesia Plan  ASA: III  Anesthesia Plan: General   Post-op Pain Management:    Induction: Intravenous  PONV Risk Score and Plan: 3 and Treatment may vary due to age or medical condition  Airway Management Planned: LMA  Additional Equipment:   Intra-op Plan:   Post-operative Plan: Extubation in OR  Informed Consent: I have reviewed the patients History and Physical, chart, labs and discussed the procedure including the risks, benefits and alternatives for the proposed anesthesia with the patient or authorized representative who has indicated his/her understanding and acceptance.   Dental advisory given  Plan Discussed with: CRNA  Anesthesia Plan Comments:         Anesthesia Quick Evaluation

## 2017-04-28 NOTE — Progress Notes (Signed)
Orthopedic Tech Progress Note Patient Details:  [name] 08-Jun-1965 [id number]  Ortho Devices Type of Ortho Device: Postop shoe/boot Ortho Device/Splint Location: rle Ortho Device/Splint Interventions: Application   Post Interventions Patient Tolerated: Well Instructions Provided: Care of device   Hildred Priest 04/28/2017, 9:43 AM Viewed order from doctor's order list

## 2017-04-29 ENCOUNTER — Encounter (HOSPITAL_COMMUNITY): Payer: Self-pay | Admitting: Orthopedic Surgery

## 2017-05-05 ENCOUNTER — Telehealth (INDEPENDENT_AMBULATORY_CARE_PROVIDER_SITE_OTHER): Payer: Self-pay

## 2017-05-05 ENCOUNTER — Ambulatory Visit (INDEPENDENT_AMBULATORY_CARE_PROVIDER_SITE_OTHER): Payer: Medicare Other

## 2017-05-05 ENCOUNTER — Inpatient Hospital Stay (INDEPENDENT_AMBULATORY_CARE_PROVIDER_SITE_OTHER): Payer: Medicare Other

## 2017-05-05 ENCOUNTER — Encounter (INDEPENDENT_AMBULATORY_CARE_PROVIDER_SITE_OTHER): Payer: Self-pay

## 2017-05-05 VITALS — Ht 75.0 in | Wt 180.0 lb

## 2017-05-05 DIAGNOSIS — L97511 Non-pressure chronic ulcer of other part of right foot limited to breakdown of skin: Secondary | ICD-10-CM

## 2017-05-05 NOTE — Telephone Encounter (Signed)
Pt asked if he could have a refill on his pain medication. Ran report and received Oxycodone 5mg  #90 from Dr. Cindie Laroche on 04/20/17 and hen oxycodone 5/325 #40 on 04/28/17 from you please please advise.

## 2017-05-05 NOTE — Progress Notes (Addendum)
Patient is s/p a right 1st MT dorsal closing wedge osteotomy 04/28/17. He is weight bearing through the heel with a regular tennis shoe on. The pt states that he removed his surgical dressing this morning and applied kerlix. The incision is well approximated and stitches are intact. There is a spot amount of bloody drainage at the distal end of the incision. There is bruising along the ankle and toes. There is minimal swelling. Advised the pt to continue with non weight bearing on the forefoot.  He will wash the foot with Dial soap and water daily and apply a dry dressing. He will follow up with Dr. Sharol Given in one week.      Yajahira Tison, [place], [employer]

## 2017-05-06 ENCOUNTER — Other Ambulatory Visit (INDEPENDENT_AMBULATORY_CARE_PROVIDER_SITE_OTHER): Payer: Self-pay | Admitting: Orthopedic Surgery

## 2017-05-06 MED ORDER — OXYCODONE-ACETAMINOPHEN 5-325 MG PO TABS: 1.0000 | ORAL_TABLET | ORAL | 0 refills | Status: DC | PRN

## 2017-05-06 NOTE — Telephone Encounter (Signed)
rx written

## 2017-05-06 NOTE — Telephone Encounter (Signed)
Called and sw pt to advise that rx is at the front desk for pick up.

## 2017-05-11 ENCOUNTER — Ambulatory Visit (INDEPENDENT_AMBULATORY_CARE_PROVIDER_SITE_OTHER): Payer: Medicare Other | Admitting: Orthopedic Surgery

## 2017-05-11 ENCOUNTER — Encounter (INDEPENDENT_AMBULATORY_CARE_PROVIDER_SITE_OTHER): Payer: Self-pay | Admitting: Orthopedic Surgery

## 2017-05-11 VITALS — Ht 75.0 in | Wt 180.0 lb

## 2017-05-11 DIAGNOSIS — L97511 Non-pressure chronic ulcer of other part of right foot limited to breakdown of skin: Secondary | ICD-10-CM

## 2017-05-11 NOTE — Progress Notes (Signed)
Office Visit Note   Patient: [name]           Date of Birth: 1965/07/26           MRN: [id number] Visit Date: 05/11/2017              Requested by: Lucia Gaskins, MD [address] [place], [place] [address] PCP: Lucia Gaskins, MD  Chief Complaint  Patient presents with  . Right Foot - Routine Post Op    04/28/17 right foot 1st MT dorsal closing wedge osteotomy       HPI: Patient presents in follow-up status post dorsal closing wedge osteotomy for a Waggoner grade 1 ulcer beneath the first metatarsal head right foot.  Patient states that recently has developed some swelling he is full weightbearing.  Assessment & Plan: Visit Diagnoses:  1. Non-pressure chronic ulcer of other part of right foot limited to breakdown of skin ([place])     Plan: We will harvest the sutures in the right foot today he will try to minimize his activities and weightbearing he has developed a new ulcer on the first metatarsal head of the left foot.  He will apply Band-Aids to bilateral first metatarsal heads.  Follow-Up Instructions: Return in about 2 weeks (around 05/25/2017).   Ortho Exam  Patient is alert, oriented, no adenopathy, well-dressed, normal affect, normal respiratory effort. Examination the ulcer beneath the first metatarsal head right foot has completely healed.  He has some mild redness over the lateral aspect of the incision but there is no tenderness to palpation with elevation and compression the redness resolves.  We will harvest the sutures today and applied Band-Aid for the first metatarsal head on the right foot.  Left foot he has a new ulcer which is 2 cm in diameter 0.1 mm deep which appears to be increased weightbearing on the left due to protecting the right foot.  Imaging: No results found. No images are attached to the encounter.  Labs: Lab Results  Component Value Date   HGBA1C 10.3 (H) 08/17/2016   HGBA1C 7.7 (H) 12/18/2013   HGBA1C 5.4 08/05/2011   ESRSEDRATE 110 (H) 12/19/2013   ESRSEDRATE 104 (H) 12/18/2013   ESRSEDRATE 1 02/11/2011   CRP 12.2 (H) 12/19/2013   CRP 14.9 (H) 12/18/2013   REPTSTATUS 12/24/2013 FINAL 12/18/2013   REPTSTATUS 12/24/2013 FINAL 12/18/2013   GRAMSTAIN  04/19/2011    WBC PRESENT,BOTH PMN AND MONONUCLEAR NO ORGANISMS SEEN   CULT  12/18/2013    NO GROWTH 5 DAYS Performed at [facility]  12/18/2013    NO GROWTH 5 DAYS Performed at [facility]     @[email or web address] ([email or web address])@  Body mass index is 22.5 kg/m.  Orders:  No orders of the defined types were placed in this encounter.  No orders of the defined types were placed in this encounter.    Procedures: No procedures performed  Clinical Data: No additional findings.  ROS:  All other systems negative, except as noted in the HPI. Review of Systems  Objective: Vital Signs: Ht 6\' 3"  (1.905 m)   Wt 180 lb (81.6 kg)   BMI 22.50 kg/m   Specialty Comments:  No specialty comments available.  PMFS History: Patient Active Problem List   Diagnosis Date Noted  . 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 SVT (supraventricular tachycardia) ([place]) 08/17/2016  . Tobacco use disorder 10/25/2014  . Diabetes mellitus type 2 in nonobese (  HCC)   . Swelling   . Type 2 diabetes mellitus with left diabetic foot ulcer ([place]) 12/18/2013  . Diabetes mellitus due to underlying condition with foot ulcer ([place]) 12/18/2013  . Left leg cellulitis 12/18/2013  . Cellulitis of right lower extremity 07/19/2013  . Cocaine abuse ([place]) 03/29/2012  . Generalized anxiety disorder 03/29/2012  . Panic attacks 03/29/2012  . Alcohol dependence ([place]) 08/05/2011    Class: Chronic  . Substance abuse ([place]) 05/31/2011  . Foot ulcer, left ([place]) 02/15/2011  . Fasting hyperglycemia 02/15/2011  . Neuropathic ulcer of foot ([place]) 02/15/2011   Past Medical History:  Diagnosis Date  . Anxiety   . Arthritis    "everywhere"  (04/21/2012)  . Cellulitis and abscess of foot 12/19/2013   LEFT FOOT  . Chronic pain   . DDD (degenerative disc disease)   . Depression   . Diabetes mellitus without complication (HCC)    borderline  . Diabetic foot ulcer ([place])   . ETOH abuse   . GERD (gastroesophageal reflux disease)    tums  . History of blood transfusion    "related to left knee OR; probably right hip too" (04/21/2012)  . Mental disorder   . Neuromuscular disorder (HCC)    neuropathy  . Noncompliance   . Open wound    bottom of foot  . Pneumonia ~ 2012  . Polysubstance abuse (HCC)    etoh, cocaine, heroin  . Stroke [facility]) 2008   "they said I might have had one during right hip replacement" (04/21/2012)    Family History  Problem Relation Age of Onset  . Diabetes Mother   . Diabetes Father     Past Surgical History:  Procedure Laterality Date  . JOINT REPLACEMENT    . KNEE ARTHROSCOPY Bilateral 1980's/1990's  . LUNG LOBECTOMY Left ~ 2006  . LUNG LOBECTOMY    . METATARSAL OSTEOTOMY  10/29/2011   Procedure: METATARSAL OSTEOTOMY;  Surgeon: [name], MD;  Location: [place];  Service: Orthopedics;  Laterality: Left;  Left 1st Metatarsal Dorsal Closing Wedge   . METATARSAL OSTEOTOMY Right 04/28/2017   Procedure: RIGHT 1ST METATARSAL DORSAL CLOSING WEDGE OSTEOTOMY;  Surgeon: [name], MD;  Location: [place];  Service: Orthopedics;  Laterality: Right;  . REVISION TOTAL HIP ARTHROPLASTY Right 2008   "4-5 months after replacement" (04/21/2012)  . TOTAL HIP ARTHROPLASTY Right 2008  . TOTAL KNEE ARTHROPLASTY Left 2006  . TOTAL KNEE ARTHROPLASTY Right 04/20/2012  . TOTAL KNEE ARTHROPLASTY Right 04/20/2012   Procedure: TOTAL KNEE ARTHROPLASTY;  Surgeon: [name], MD;  Location: [place];  Service: Orthopedics;  Laterality: Right;  Right Total Knee Arthroplasty   Social History   Occupational History  . Not on file  Tobacco Use  . Smoking status: Current Every Day Smoker    Packs/day: 1.00    Years: 30.00     Pack years: 30.00    Types: Cigarettes  . Smokeless tobacco: Never Used  Substance and Sexual Activity  . Alcohol use: No    Alcohol/week: 0.0 oz    Frequency: Never  . Drug use: No    Types: Cocaine    Comment: Cocaine and heroin   . Sexual activity: Yes    Birth control/protection: None

## 2017-05-24 ENCOUNTER — Ambulatory Visit (INDEPENDENT_AMBULATORY_CARE_PROVIDER_SITE_OTHER): Payer: Medicare Other | Admitting: Orthopedic Surgery

## 2017-05-24 ENCOUNTER — Encounter (INDEPENDENT_AMBULATORY_CARE_PROVIDER_SITE_OTHER): Payer: Self-pay | Admitting: Orthopedic Surgery

## 2017-05-24 DIAGNOSIS — E08621 Diabetes mellitus due to underlying condition with foot ulcer: Secondary | ICD-10-CM

## 2017-05-24 DIAGNOSIS — L97509 Non-pressure chronic ulcer of other part of unspecified foot with unspecified severity: Secondary | ICD-10-CM

## 2017-05-24 DIAGNOSIS — Z794 Long term (current) use of insulin: Secondary | ICD-10-CM

## 2017-05-24 DIAGNOSIS — L97511 Non-pressure chronic ulcer of other part of right foot limited to breakdown of skin: Secondary | ICD-10-CM

## 2017-05-24 MED ORDER — OXYCODONE-ACETAMINOPHEN 5-325 MG PO TABS: 1.0000 | ORAL_TABLET | Freq: Three times a day (TID) | ORAL | 0 refills | Status: DC | PRN

## 2017-05-24 MED ORDER — DOXYCYCLINE HYCLATE 100 MG PO TABS: 100.0000 mg | ORAL_TABLET | Freq: Two times a day (BID) | ORAL | 0 refills | Status: DC

## 2017-05-24 NOTE — Progress Notes (Signed)
Office Visit Note   Patient: [name]           Date of Birth: September 10, 1965           MRN: [id number] Visit Date: 05/24/2017              Requested by: Lucia Gaskins, MD [address] [place], [place] [address] PCP: Lucia Gaskins, MD  Chief Complaint  Patient presents with  . Right Foot - Follow-up, Pain, Edema  . Left Foot - Follow-up, Pain      HPI: Patient is a 52 year old gentleman who presents about 4 weeks status post right foot dorsal closing wedge osteotomy.  Patient states that over the last several days he has noticed acute redness and swelling to midfoot as well as ulceration and swelling of the second toe right foot.  Patient states he is developed some callus beneath the first metatarsal head of the left foot.  Assessment & Plan: Visit Diagnoses:  1. Non-pressure chronic ulcer of other part of right foot limited to breakdown of skin ([place])   2. Diabetes mellitus due to underlying condition with foot ulcer, with long-term current use of insulin ([place])     Plan: We will start doxycycline refill his prescription for Percocet.  Patient will use antibiotic ointment and a Band-Aid over the right second toe.  Callus was pared on the left foot.  Reevaluate closely in 1 week.  Follow-Up Instructions: Return in about 1 week (around 05/31/2017).   Ortho Exam  Patient is alert, oriented, no adenopathy, well-dressed, normal affect, normal respiratory effort. Examination patient does have dependent redness in the right foot.  A lot of the redness does resolve with elevation there is no tenderness to palpation no fluctuance no signs of abscess.  Patient has redness cellulitis and ulceration to the second toe there is no exposed bone there is swelling of the tip of the second toe.  There is a recurrent Waggoner grade 1 ulcer beneath the first metatarsal head of the left foot.  After informed consent a 10 blade knife was used to debride the skin and soft tissue back to healthy  tissue the ulcer is 10 mm x 1 mm and depth.  Patient's ulcer beneath the first metatarsal head of the right foot has completely healed.  Imaging: No results found. No images are attached to the encounter.  Labs: Lab Results  Component Value Date   HGBA1C 10.3 (H) 08/17/2016   HGBA1C 7.7 (H) 12/18/2013   HGBA1C 5.4 08/05/2011   ESRSEDRATE 110 (H) 12/19/2013   ESRSEDRATE 104 (H) 12/18/2013   ESRSEDRATE 1 02/11/2011   CRP 12.2 (H) 12/19/2013   CRP 14.9 (H) 12/18/2013   REPTSTATUS 12/24/2013 FINAL 12/18/2013   REPTSTATUS 12/24/2013 FINAL 12/18/2013   GRAMSTAIN  04/19/2011    WBC PRESENT,BOTH PMN AND MONONUCLEAR NO ORGANISMS SEEN   CULT  12/18/2013    NO GROWTH 5 DAYS Performed at [facility]  12/18/2013    NO GROWTH 5 DAYS Performed at [facility]     Lab Results  Component Value Date/Time   HGBA1C 10.3 (H) 08/17/2016 05:34 AM   HGBA1C 7.7 (H) 12/18/2013 05:26 PM   HGBA1C 5.4 08/05/2011 08:21 PM    There is no height or weight on file to calculate BMI.  Orders:  No orders of the defined types were placed in this encounter.  No orders of the defined types were placed in this encounter.    Procedures:  No procedures performed  Clinical Data: No additional findings.  ROS:  All other systems negative, except as noted in the HPI. Review of Systems  Objective: Vital Signs: There were no vitals taken for this visit.  Specialty Comments:  No specialty comments available.  PMFS History: Patient Active Problem List   Diagnosis Date Noted  . Chronic ulcer of right foot limited to breakdown of skin ([place])   . SVT (supraventricular tachycardia) ([place]) 08/17/2016  . Tobacco use disorder 10/25/2014  . Diabetes mellitus type 2 in nonobese (HCC)   . Swelling   . Type 2 diabetes mellitus with left diabetic foot ulcer ([place]) 12/18/2013  . Diabetes mellitus due to underlying condition with foot ulcer ([place]) 12/18/2013  . Left leg cellulitis  12/18/2013  . Cellulitis of right lower extremity 07/19/2013  . Cocaine abuse ([place]) 03/29/2012  . Generalized anxiety disorder 03/29/2012  . Panic attacks 03/29/2012  . Alcohol dependence ([place]) 08/05/2011    Class: Chronic  . Substance abuse ([place]) 05/31/2011  . Foot ulcer, left ([place]) 02/15/2011  . Fasting hyperglycemia 02/15/2011  . Neuropathic ulcer of foot ([place]) 02/15/2011   Past Medical History:  Diagnosis Date  . Anxiety   . Arthritis    "everywhere" (04/21/2012)  . Cellulitis and abscess of foot 12/19/2013   LEFT FOOT  . Chronic pain   . DDD (degenerative disc disease)   . Depression   . Diabetes mellitus without complication (HCC)    borderline  . Diabetic foot ulcer ([place])   . ETOH abuse   . GERD (gastroesophageal reflux disease)    tums  . History of blood transfusion    "related to left knee OR; probably right hip too" (04/21/2012)  . Mental disorder   . Neuromuscular disorder (HCC)    neuropathy  . Noncompliance   . Open wound    bottom of foot  . Pneumonia ~ 2012  . Polysubstance abuse (HCC)    etoh, cocaine, heroin  . Stroke [facility]) 2008   "they said I might have had one during right hip replacement" (04/21/2012)    Family History  Problem Relation Age of Onset  . Diabetes Mother   . Diabetes Father     Past Surgical History:  Procedure Laterality Date  . JOINT REPLACEMENT    . KNEE ARTHROSCOPY Bilateral 1980's/1990's  . LUNG LOBECTOMY Left ~ 2006  . LUNG LOBECTOMY    . METATARSAL OSTEOTOMY  10/29/2011   Procedure: METATARSAL OSTEOTOMY;  Surgeon: [name], MD;  Location: [facility];  Service: Orthopedics;  Laterality: Left;  Left 1st Metatarsal Dorsal Closing Wedge   . METATARSAL OSTEOTOMY Right 04/28/2017   Procedure: RIGHT 1ST METATARSAL DORSAL CLOSING WEDGE OSTEOTOMY;  Surgeon: [name], MD;  Location: [facility];  Service: Orthopedics;  Laterality: Right;  . REVISION TOTAL HIP ARTHROPLASTY Right 2008   "4-5 months after replacement" (04/21/2012)  .  TOTAL HIP ARTHROPLASTY Right 2008  . TOTAL KNEE ARTHROPLASTY Left 2006  . TOTAL KNEE ARTHROPLASTY Right 04/20/2012  . TOTAL KNEE ARTHROPLASTY Right 04/20/2012   Procedure: TOTAL KNEE ARTHROPLASTY;  Surgeon: [name], MD;  Location: [facility];  Service: Orthopedics;  Laterality: Right;  Right Total Knee Arthroplasty   Social History   Occupational History  . Not on file  Tobacco Use  . Smoking status: Current Every Day Smoker    Packs/day: 1.00    Years: 30.00    Pack years: 30.00    Types: Cigarettes  . Smokeless tobacco: Never  Used  Substance and Sexual Activity  . Alcohol use: No    Alcohol/week: 0.0 oz    Frequency: Never  . Drug use: No    Types: Cocaine    Comment: Cocaine and heroin   . Sexual activity: Yes    Birth control/protection: None

## 2017-06-01 ENCOUNTER — Ambulatory Visit (INDEPENDENT_AMBULATORY_CARE_PROVIDER_SITE_OTHER): Payer: Medicare Other | Admitting: Orthopedic Surgery

## 2017-06-01 ENCOUNTER — Encounter (INDEPENDENT_AMBULATORY_CARE_PROVIDER_SITE_OTHER): Payer: Self-pay | Admitting: Orthopedic Surgery

## 2017-06-01 VITALS — Ht 75.0 in | Wt 180.0 lb

## 2017-06-01 DIAGNOSIS — L97511 Non-pressure chronic ulcer of other part of right foot limited to breakdown of skin: Secondary | ICD-10-CM | POA: Diagnosis not present

## 2017-06-01 DIAGNOSIS — L97522 Non-pressure chronic ulcer of other part of left foot with fat layer exposed: Secondary | ICD-10-CM

## 2017-06-01 DIAGNOSIS — M869 Osteomyelitis, unspecified: Secondary | ICD-10-CM

## 2017-06-01 MED ORDER — OXYCODONE-ACETAMINOPHEN 5-325 MG PO TABS: 1.0000 | ORAL_TABLET | Freq: Three times a day (TID) | ORAL | 0 refills | Status: DC | PRN

## 2017-06-01 NOTE — Progress Notes (Signed)
Office Visit Note   Patient: [name]           Date of Birth: [birth year]           MRN: [id number] Visit Date: 06/01/2017              Requested by: Lucia Gaskins, MD [address] [place], [place] [address] PCP: Lucia Gaskins, MD  Chief Complaint  Patient presents with  . Right Foot - Routine Post Op    1st MT dorsal closing wedge osteotomy 04/28/17      HPI: Patient is a 52 year old gentleman who presents for 3 separate issues #1 recurrence of an ulceration beneath the first metatarsal head left foot #2 status post closing dorsal wedge osteotomy of the right foot first metatarsal #3 acute osteomyelitis with ulceration right foot second toe.  Assessment & Plan: Visit Diagnoses:  1. Non-pressure chronic ulcer of other part of right foot limited to breakdown of skin ([place])   2. Osteomyelitis of second toe of right foot ([place])     Plan: We will place the patient a postoperative shoe plus a felt relieving donut for the ulcer beneath the left foot first metatarsal head.  Patient will continue with regular shoewear in the right foot the ulcer is healed beneath the first metatarsal head right foot we will need to set him up for surgery for amputation of the second toe right foot and we will call to set this up for next week.  Patient will complete his course of oral antibiotics.  Follow-Up Instructions: Return in about 2 weeks (around 06/15/2017).   Ortho Exam  Patient is alert, oriented, no adenopathy, well-dressed, normal affect, normal respiratory effort. Examination patient has a good dorsalis pedis pulse bilaterally.  Left foot shows recurrent Waggoner grade 1 ulcer beneath the first metatarsal head.  This ulcer is 7 mm in diameter 1 mm deep with healthy granulation tissue this does not probe to bone there is no cellulitis no odor no drainage.  Examination of the right foot the first ray dorsal closing wedge osteotomy is healed well the incision is well-healed there is no  redness no cellulitis.  Examination of the right foot second toe patient had a large ulcer over the tip of the second toe there is swelling and cellulitis there is exposed bone.  There are destructive changes to the bone.  There is no ascending cellulitis.  Imaging: No results found. No images are attached to the encounter.  Labs: Lab Results  Component Value Date   HGBA1C 10.3 (H) 08/17/2016   HGBA1C 7.7 (H) 12/18/2013   HGBA1C 5.4 08/05/2011   ESRSEDRATE 110 (H) 12/19/2013   ESRSEDRATE 104 (H) 12/18/2013   ESRSEDRATE 1 02/11/2011   CRP 12.2 (H) 12/19/2013   CRP 14.9 (H) 12/18/2013   REPTSTATUS 12/24/2013 FINAL 12/18/2013   REPTSTATUS 12/24/2013 FINAL 12/18/2013   GRAMSTAIN  04/19/2011    WBC PRESENT,BOTH PMN AND MONONUCLEAR NO ORGANISMS SEEN   CULT  12/18/2013    NO GROWTH 5 DAYS Performed at [facility]  12/18/2013    NO GROWTH 5 DAYS Performed at [facility]      Lab Results  Component Value Date   ALBUMIN 4.3 12/14/2016   ALBUMIN 3.7 09/19/2016   ALBUMIN 3.9 09/04/2016    Body mass index is 22.5 kg/m.  Orders:  No orders of the defined types were placed in this encounter.  Meds ordered this encounter  Medications  .  oxyCODONE-acetaminophen (PERCOCET/ROXICET) 5-325 MG tablet    Sig: Take 1 tablet by mouth every 8 (eight) hours as needed for severe pain.    Dispense:  20 tablet    Refill:  0     Procedures: No procedures performed  Clinical Data: No additional findings.  ROS:  All other systems negative, except as noted in the HPI. Review of Systems  Objective: Vital Signs: Ht 6\' 3"  (1.905 m)   Wt 180 lb (81.6 kg)   BMI 22.50 kg/m   Specialty Comments:  No specialty comments available.  PMFS History: Patient Active Problem List   Diagnosis Date Noted  . Chronic ulcer of right foot limited to breakdown of skin ([place])   . SVT (supraventricular tachycardia) ([place]) 08/17/2016  . Tobacco use disorder 10/25/2014  .  Diabetes mellitus type 2 in nonobese (HCC)   . Swelling   . 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 Diabetes mellitus due to underlying condition with foot ulcer ([place]) 12/18/2013  . Left leg cellulitis 12/18/2013  . Cellulitis of right lower extremity 07/19/2013  . Cocaine abuse ([place]) 03/29/2012  . Generalized anxiety disorder 03/29/2012  . Panic attacks 03/29/2012  . Alcohol dependence ([place]) 08/05/2011    Class: Chronic  . Substance abuse ([place]) 05/31/2011  . Foot ulcer, left ([place]) 02/15/2011  . Fasting hyperglycemia 02/15/2011  . Neuropathic ulcer of foot ([place]) 02/15/2011   Past Medical History:  Diagnosis Date  . Anxiety   . Arthritis    "everywhere" (04/21/2012)  . Cellulitis and abscess of foot 12/19/2013   LEFT FOOT  . Chronic pain   . DDD (degenerative disc disease)   . Depression   . Diabetes mellitus without complication (HCC)    borderline  . Diabetic foot ulcer ([place])   . ETOH abuse   . GERD (gastroesophageal reflux disease)    tums  . History of blood transfusion    "related to left knee OR; probably right hip too" (04/21/2012)  . Mental disorder   . Neuromuscular disorder (HCC)    neuropathy  . Noncompliance   . Open wound    bottom of foot  . Pneumonia ~ 2012  . Polysubstance abuse (HCC)    etoh, cocaine, heroin  . Stroke [facility]) 2008   "they said I might have had one during right hip replacement" (04/21/2012)    Family History  Problem Relation Age of Onset  . Diabetes Mother   . Diabetes Father     Past Surgical History:  Procedure Laterality Date  . JOINT REPLACEMENT    . KNEE ARTHROSCOPY Bilateral 1980's/1990's  . LUNG LOBECTOMY Left ~ 2006  . LUNG LOBECTOMY    . METATARSAL OSTEOTOMY  10/29/2011   Procedure: METATARSAL OSTEOTOMY;  Surgeon: [name], MD;  Location: [facility];  Service: Orthopedics;  Laterality: Left;  Left 1st Metatarsal Dorsal Closing Wedge   . METATARSAL OSTEOTOMY Right 04/28/2017   Procedure:  RIGHT 1ST METATARSAL DORSAL CLOSING WEDGE OSTEOTOMY;  Surgeon: [name], MD;  Location: [facility];  Service: Orthopedics;  Laterality: Right;  . REVISION TOTAL HIP ARTHROPLASTY Right 2008   "4-5 months after replacement" (04/21/2012)  . TOTAL HIP ARTHROPLASTY Right 2008  . TOTAL KNEE ARTHROPLASTY Left 2006  . TOTAL KNEE ARTHROPLASTY Right 04/20/2012  . TOTAL KNEE ARTHROPLASTY Right 04/20/2012   Procedure: TOTAL KNEE ARTHROPLASTY;  Surgeon: [name], MD;  Location: [facility];  Service: Orthopedics;  Laterality: Right;  Right Total Knee  Arthroplasty   Social History   Occupational History  . Not on file  Tobacco Use  . Smoking status: Current Every Day Smoker    Packs/day: 1.00    Years: 30.00    Pack years: 30.00    Types: Cigarettes  . Smokeless tobacco: Never Used  Substance and Sexual Activity  . Alcohol use: No    Alcohol/week: 0.0 oz    Frequency: Never  . Drug use: No    Types: Cocaine    Comment: Cocaine and heroin   . Sexual activity: Yes    Birth control/protection: None

## 2017-06-04 ENCOUNTER — Telehealth (INDEPENDENT_AMBULATORY_CARE_PROVIDER_SITE_OTHER): Payer: Self-pay | Admitting: Orthopedic Surgery

## 2017-06-04 ENCOUNTER — Inpatient Hospital Stay (HOSPITAL_COMMUNITY)
Admission: EM | Admit: 2017-06-04 | Discharge: 2017-06-10 | DRG: 617 | Disposition: A | Discharge status: Home Health | Payer: Medicare Other | Attending: Internal Medicine | Admitting: Internal Medicine

## 2017-06-04 ENCOUNTER — Emergency Department (HOSPITAL_COMMUNITY): Payer: Medicare Other

## 2017-06-04 ENCOUNTER — Other Ambulatory Visit: Payer: Self-pay

## 2017-06-04 ENCOUNTER — Encounter (HOSPITAL_COMMUNITY): Payer: Self-pay

## 2017-06-04 DIAGNOSIS — F329 Major depressive disorder, single episode, unspecified: Secondary | ICD-10-CM | POA: Diagnosis present

## 2017-06-04 DIAGNOSIS — L97519 Non-pressure chronic ulcer of other part of right foot with unspecified severity: Secondary | ICD-10-CM

## 2017-06-04 DIAGNOSIS — Z96653 Presence of artificial knee joint, bilateral: Secondary | ICD-10-CM | POA: Diagnosis present

## 2017-06-04 DIAGNOSIS — L03115 Cellulitis of right lower limb: Secondary | ICD-10-CM | POA: Diagnosis present

## 2017-06-04 DIAGNOSIS — Z7984 Long term (current) use of oral hypoglycemic drugs: Secondary | ICD-10-CM

## 2017-06-04 DIAGNOSIS — E11621 Type 2 diabetes mellitus with foot ulcer: Principal | ICD-10-CM | POA: Diagnosis present

## 2017-06-04 DIAGNOSIS — Z79891 Long term (current) use of opiate analgesic: Secondary | ICD-10-CM

## 2017-06-04 DIAGNOSIS — Z8673 Personal history of transient ischemic attack (TIA), and cerebral infarction without residual deficits: Secondary | ICD-10-CM

## 2017-06-04 DIAGNOSIS — Z902 Acquired absence of lung [part of]: Secondary | ICD-10-CM

## 2017-06-04 DIAGNOSIS — G8929 Other chronic pain: Secondary | ICD-10-CM | POA: Diagnosis present

## 2017-06-04 DIAGNOSIS — Z888 Allergy status to other drugs, medicaments and biological substances status: Secondary | ICD-10-CM

## 2017-06-04 DIAGNOSIS — Z791 Long term (current) use of non-steroidal anti-inflammatories (NSAID): Secondary | ICD-10-CM

## 2017-06-04 DIAGNOSIS — E1140 Type 2 diabetes mellitus with diabetic neuropathy, unspecified: Secondary | ICD-10-CM | POA: Diagnosis present

## 2017-06-04 DIAGNOSIS — M1990 Unspecified osteoarthritis, unspecified site: Secondary | ICD-10-CM | POA: Diagnosis present

## 2017-06-04 DIAGNOSIS — L089 Local infection of the skin and subcutaneous tissue, unspecified: Secondary | ICD-10-CM

## 2017-06-04 DIAGNOSIS — M79671 Pain in right foot: Secondary | ICD-10-CM | POA: Diagnosis not present

## 2017-06-04 DIAGNOSIS — M86271 Subacute osteomyelitis, right ankle and foot: Secondary | ICD-10-CM

## 2017-06-04 DIAGNOSIS — M869 Osteomyelitis, unspecified: Secondary | ICD-10-CM

## 2017-06-04 DIAGNOSIS — Z96641 Presence of right artificial hip joint: Secondary | ICD-10-CM | POA: Diagnosis present

## 2017-06-04 DIAGNOSIS — Z8701 Personal history of pneumonia (recurrent): Secondary | ICD-10-CM

## 2017-06-04 DIAGNOSIS — Z833 Family history of diabetes mellitus: Secondary | ICD-10-CM

## 2017-06-04 DIAGNOSIS — E1169 Type 2 diabetes mellitus with other specified complication: Secondary | ICD-10-CM | POA: Diagnosis present

## 2017-06-04 DIAGNOSIS — F17210 Nicotine dependence, cigarettes, uncomplicated: Secondary | ICD-10-CM | POA: Diagnosis present

## 2017-06-04 DIAGNOSIS — T148XXA Other injury of unspecified body region, initial encounter: Secondary | ICD-10-CM

## 2017-06-04 DIAGNOSIS — E1165 Type 2 diabetes mellitus with hyperglycemia: Secondary | ICD-10-CM | POA: Diagnosis present

## 2017-06-04 DIAGNOSIS — L97521 Non-pressure chronic ulcer of other part of left foot limited to breakdown of skin: Secondary | ICD-10-CM

## 2017-06-04 DIAGNOSIS — M86679 Other chronic osteomyelitis, unspecified ankle and foot: Secondary | ICD-10-CM | POA: Diagnosis present

## 2017-06-04 DIAGNOSIS — L97509 Non-pressure chronic ulcer of other part of unspecified foot with unspecified severity: Secondary | ICD-10-CM | POA: Diagnosis present

## 2017-06-04 DIAGNOSIS — Z79899 Other long term (current) drug therapy: Secondary | ICD-10-CM

## 2017-06-04 DIAGNOSIS — F419 Anxiety disorder, unspecified: Secondary | ICD-10-CM | POA: Diagnosis present

## 2017-06-04 DIAGNOSIS — L97529 Non-pressure chronic ulcer of other part of left foot with unspecified severity: Secondary | ICD-10-CM | POA: Diagnosis present

## 2017-06-04 DIAGNOSIS — F1021 Alcohol dependence, in remission: Secondary | ICD-10-CM | POA: Diagnosis present

## 2017-06-04 DIAGNOSIS — I10 Essential (primary) hypertension: Secondary | ICD-10-CM | POA: Diagnosis present

## 2017-06-04 DIAGNOSIS — K219 Gastro-esophageal reflux disease without esophagitis: Secondary | ICD-10-CM | POA: Diagnosis present

## 2017-06-04 LAB — CBC WITH DIFFERENTIAL/PLATELET
BASOS PCT: 0 %
Basophils Absolute: 0 10*3/uL (ref 0.0–0.1)
EOS ABS: 0.3 10*3/uL (ref 0.0–0.7)
EOS PCT: 2 %
HEMATOCRIT: 44.3 % (ref 39.0–52.0)
HEMOGLOBIN: 14.7 g/dL (ref 13.0–17.0)
LYMPHS PCT: 23 %
Lymphs Abs: 3.8 10*3/uL (ref 0.7–4.0)
MCH: 27.3 pg (ref 26.0–34.0)
MCHC: 33.2 g/dL (ref 30.0–36.0)
MCV: 82.3 fL (ref 78.0–100.0)
Monocytes Absolute: 0.8 10*3/uL (ref 0.1–1.0)
Monocytes Relative: 5 %
NEUTROS ABS: 11.7 10*3/uL — AB (ref 1.7–7.7)
Neutrophils Relative %: 70 %
RBC: 5.38 MIL/uL (ref 4.22–5.81)
RDW: 16.9 % — ABNORMAL HIGH (ref 11.5–15.5)
WBC: 16.6 10*3/uL — ABNORMAL HIGH (ref 4.0–10.5)

## 2017-06-04 LAB — I-STAT CG4 LACTIC ACID, ED: Lactic Acid, Venous: 1.89 mmol/L (ref 0.5–1.9)

## 2017-06-04 LAB — SEDIMENTATION RATE: SED RATE: 5 mm/h (ref 0–16)

## 2017-06-04 MED ORDER — OXYCODONE-ACETAMINOPHEN 5-325 MG PO TABS
1.0000 | ORAL_TABLET | ORAL | Status: DC | PRN
  Administered 2017-06-04: 1 via ORAL
  Filled 2017-06-04: qty 1

## 2017-06-04 NOTE — Telephone Encounter (Signed)
I returned [name] phone call regarding his toe amputation that needed to be scheduled for Wed 06/09/2017.  He actually called in and spoke with triage earlier and complained of new symptoms that prompted triage to advise him to go to the [facility].  When I called I verified that he was on his way to the ER and advised that his surgery was set up for Wed 06/09/2017 like Dr. Sharol Given advised at his last appointment.

## 2017-06-04 NOTE — ED Triage Notes (Signed)
Pt endorses infected toe on right foot, pt has amputation scheduled next week with Dr Sharol Given but states "it's getting worse" Wound is pus drainage and red around it. VSS,

## 2017-06-04 NOTE — ED Provider Notes (Signed)
Patient placed in Quick Look pathway, seen and evaluated   Chief Complaint: R foot pain/wound  HPI:   Pt is a 52 y.o. male with a PMHx of DM2, CVA, DDD, and multiple other medical problems, presenting today with c/o worsening R foot pain and wound to 2nd toe.  Pt states that he had surgery on his R foot a few weeks ago and then after that he started to have a wound to his R 2nd toe; this wound has gotten worse over the last 1 week.  Chart review reveals that he's scheduled for a R 2nd toe amputation on 06/09/17 with Dr. Sharol Given.  He was last seen by Dr. Sharol Given on 06/01/17 and is currently on doxycycline, however he states that despite taking the antibiotic, the wound continues to worsen, so he came here for further evaluation.  He complains of drainage, erythema, warmth, swelling, and pain to the right second toe.  He also had a fever of 101.0 today.  He also states he has some nausea.  He denies any red streaking, vomiting, or new/worsening numbness or tingling.  ROS: +R foot wound with pain/swelling/warmth/erythema, +fevers, +nausea. No red streaking, vomiting, or new/worsening numbness or tingling   Physical Exam:  BP 119/88 (BP Location: Right Arm)   Pulse 95   Temp 98.2 F (36.8 C) (Oral)   Resp 16   Ht 6\' 3"  (1.905 m)   Wt 81.6 kg (180 lb)   SpO2 100%   BMI 22.50 kg/m    Gen: No distress  Neuro: Awake and Alert  Skin: Warm    Focused Exam: R foot with wound to distal tip of 2nd toe, mild drainage, moderate erythema and warmth extending to the MCP joint area, mild swelling, mild TTP diffusely in the toe, no crepitus or deformity, NVI with soft compartments, no red streaking.    Initiation of care has begun. The patient has been counseled on the process, plan, and necessity for staying for the completion/evaluation, and the remainder of the medical screening examination     [address] [place], [place] 06/04/17 1804    Fatima Blank, MD 06/04/17 2239

## 2017-06-05 ENCOUNTER — Encounter (HOSPITAL_COMMUNITY): Payer: Self-pay | Admitting: Family Medicine

## 2017-06-05 DIAGNOSIS — M79671 Pain in right foot: Secondary | ICD-10-CM | POA: Diagnosis present

## 2017-06-05 DIAGNOSIS — E1165 Type 2 diabetes mellitus with hyperglycemia: Secondary | ICD-10-CM | POA: Diagnosis present

## 2017-06-05 DIAGNOSIS — I10 Essential (primary) hypertension: Secondary | ICD-10-CM | POA: Diagnosis present

## 2017-06-05 DIAGNOSIS — M869 Osteomyelitis, unspecified: Secondary | ICD-10-CM

## 2017-06-05 DIAGNOSIS — Z96641 Presence of right artificial hip joint: Secondary | ICD-10-CM | POA: Diagnosis present

## 2017-06-05 DIAGNOSIS — L97529 Non-pressure chronic ulcer of other part of left foot with unspecified severity: Secondary | ICD-10-CM | POA: Diagnosis present

## 2017-06-05 DIAGNOSIS — M86679 Other chronic osteomyelitis, unspecified ankle and foot: Secondary | ICD-10-CM | POA: Diagnosis present

## 2017-06-05 DIAGNOSIS — L97519 Non-pressure chronic ulcer of other part of right foot with unspecified severity: Secondary | ICD-10-CM | POA: Diagnosis present

## 2017-06-05 DIAGNOSIS — L97509 Non-pressure chronic ulcer of other part of unspecified foot with unspecified severity: Secondary | ICD-10-CM | POA: Diagnosis not present

## 2017-06-05 DIAGNOSIS — E11621 Type 2 diabetes mellitus with foot ulcer: Principal | ICD-10-CM

## 2017-06-05 DIAGNOSIS — K219 Gastro-esophageal reflux disease without esophagitis: Secondary | ICD-10-CM | POA: Diagnosis present

## 2017-06-05 DIAGNOSIS — E1169 Type 2 diabetes mellitus with other specified complication: Secondary | ICD-10-CM | POA: Diagnosis present

## 2017-06-05 DIAGNOSIS — M86671 Other chronic osteomyelitis, right ankle and foot: Secondary | ICD-10-CM | POA: Diagnosis not present

## 2017-06-05 DIAGNOSIS — Z902 Acquired absence of lung [part of]: Secondary | ICD-10-CM | POA: Diagnosis not present

## 2017-06-05 DIAGNOSIS — Z888 Allergy status to other drugs, medicaments and biological substances status: Secondary | ICD-10-CM | POA: Diagnosis not present

## 2017-06-05 DIAGNOSIS — F1021 Alcohol dependence, in remission: Secondary | ICD-10-CM | POA: Diagnosis present

## 2017-06-05 DIAGNOSIS — L97521 Non-pressure chronic ulcer of other part of left foot limited to breakdown of skin: Secondary | ICD-10-CM | POA: Diagnosis not present

## 2017-06-05 DIAGNOSIS — Z833 Family history of diabetes mellitus: Secondary | ICD-10-CM | POA: Diagnosis not present

## 2017-06-05 DIAGNOSIS — Z8701 Personal history of pneumonia (recurrent): Secondary | ICD-10-CM | POA: Diagnosis not present

## 2017-06-05 DIAGNOSIS — Z7984 Long term (current) use of oral hypoglycemic drugs: Secondary | ICD-10-CM | POA: Diagnosis not present

## 2017-06-05 DIAGNOSIS — F329 Major depressive disorder, single episode, unspecified: Secondary | ICD-10-CM | POA: Diagnosis present

## 2017-06-05 DIAGNOSIS — L97514 Non-pressure chronic ulcer of other part of right foot with necrosis of bone: Secondary | ICD-10-CM | POA: Diagnosis not present

## 2017-06-05 DIAGNOSIS — F419 Anxiety disorder, unspecified: Secondary | ICD-10-CM | POA: Diagnosis present

## 2017-06-05 DIAGNOSIS — F17210 Nicotine dependence, cigarettes, uncomplicated: Secondary | ICD-10-CM | POA: Diagnosis present

## 2017-06-05 DIAGNOSIS — M1990 Unspecified osteoarthritis, unspecified site: Secondary | ICD-10-CM | POA: Diagnosis present

## 2017-06-05 DIAGNOSIS — Z8673 Personal history of transient ischemic attack (TIA), and cerebral infarction without residual deficits: Secondary | ICD-10-CM | POA: Diagnosis not present

## 2017-06-05 DIAGNOSIS — G8929 Other chronic pain: Secondary | ICD-10-CM | POA: Diagnosis present

## 2017-06-05 DIAGNOSIS — L03115 Cellulitis of right lower limb: Secondary | ICD-10-CM

## 2017-06-05 DIAGNOSIS — E1140 Type 2 diabetes mellitus with diabetic neuropathy, unspecified: Secondary | ICD-10-CM | POA: Diagnosis present

## 2017-06-05 DIAGNOSIS — Z96653 Presence of artificial knee joint, bilateral: Secondary | ICD-10-CM | POA: Diagnosis present

## 2017-06-05 LAB — COMPREHENSIVE METABOLIC PANEL
ALBUMIN: 3.8 g/dL (ref 3.5–5.0)
ALK PHOS: 88 U/L (ref 38–126)
ALT: 13 U/L — ABNORMAL LOW (ref 17–63)
ANION GAP: 11 (ref 5–15)
AST: 21 U/L (ref 15–41)
BILIRUBIN TOTAL: 0.4 mg/dL (ref 0.3–1.2)
BUN: 15 mg/dL (ref 6–20)
CALCIUM: 9.3 mg/dL (ref 8.9–10.3)
CO2: 25 mmol/L (ref 22–32)
Chloride: 98 mmol/L — ABNORMAL LOW (ref 101–111)
Creatinine, Ser: 0.86 mg/dL (ref 0.61–1.24)
GFR calc Af Amer: >60 mL/min (ref >60)
GFR calc non Af Amer: >60 mL/min (ref >60)
GLUCOSE: 137 mg/dL — AB (ref 65–99)
Potassium: 3.9 mmol/L (ref 3.5–5.1)
Sodium: 134 mmol/L — ABNORMAL LOW (ref 135–145)
TOTAL PROTEIN: 6.6 g/dL (ref 6.5–8.1)

## 2017-06-05 LAB — GLUCOSE, CAPILLARY
GLUCOSE-CAPILLARY: 209 mg/dL — AB (ref 65–99)
GLUCOSE-CAPILLARY: 160 mg/dL — AB (ref 65–99)
Glucose-Capillary: 123 mg/dL — ABNORMAL HIGH (ref 65–99)

## 2017-06-05 LAB — MRSA PCR SCREENING: MRSA by PCR: NEGATIVE

## 2017-06-05 LAB — CBG MONITORING, ED: GLUCOSE-CAPILLARY: 109 mg/dL — AB (ref 65–99)

## 2017-06-05 LAB — C-REACTIVE PROTEIN: CRP: <0.8 mg/dL (ref <1.0)

## 2017-06-05 MED ORDER — ROSUVASTATIN CALCIUM 20 MG PO TABS
20.0000 mg | ORAL_TABLET | Freq: Every day | ORAL | Status: DC
  Administered 2017-06-06 – 2017-06-09 (×4): 20 mg via ORAL
  Filled 2017-06-05 (×7): qty 1

## 2017-06-05 MED ORDER — SODIUM CHLORIDE 0.9 % IV BOLUS
1000.0000 mL | Freq: Once | INTRAVENOUS | Status: AC
  Administered 2017-06-05: 1000 mL via INTRAVENOUS

## 2017-06-05 MED ORDER — SODIUM CHLORIDE 0.9 % IV SOLN
2.0000 g | INTRAVENOUS | Status: DC
  Administered 2017-06-05 – 2017-06-10 (×5): 2 g via INTRAVENOUS
  Filled 2017-06-05 (×6): qty 20

## 2017-06-05 MED ORDER — INSULIN ASPART 100 UNIT/ML ~~LOC~~ SOLN
0.0000 [IU] | Freq: Three times a day (TID) | SUBCUTANEOUS | Status: DC
  Administered 2017-06-05: 3 [IU] via SUBCUTANEOUS
  Administered 2017-06-06 (×3): 2 [IU] via SUBCUTANEOUS
  Administered 2017-06-07: 1 [IU] via SUBCUTANEOUS
  Administered 2017-06-07 (×2): 2 [IU] via SUBCUTANEOUS
  Administered 2017-06-08: 1 [IU] via SUBCUTANEOUS
  Administered 2017-06-08 – 2017-06-09 (×3): 2 [IU] via SUBCUTANEOUS
  Administered 2017-06-10 (×2): 1 [IU] via SUBCUTANEOUS

## 2017-06-05 MED ORDER — ACETAMINOPHEN 650 MG RE SUPP: 650.0000 mg | Freq: Four times a day (QID) | RECTAL | Status: DC | PRN

## 2017-06-05 MED ORDER — PRO-STAT SUGAR FREE PO LIQD
30.0000 mL | Freq: Two times a day (BID) | ORAL | Status: DC
  Administered 2017-06-06 – 2017-06-10 (×8): 30 mL via ORAL
  Filled 2017-06-05 (×8): qty 30

## 2017-06-05 MED ORDER — VANCOMYCIN HCL 10 G IV SOLR
1500.0000 mg | Freq: Once | INTRAVENOUS | Status: AC
  Administered 2017-06-05: 1500 mg via INTRAVENOUS
  Filled 2017-06-05: qty 1500

## 2017-06-05 MED ORDER — ONDANSETRON HCL 4 MG/2ML IJ SOLN
4.0000 mg | Freq: Once | INTRAMUSCULAR | Status: AC
  Administered 2017-06-05: 4 mg via INTRAVENOUS
  Filled 2017-06-05: qty 2

## 2017-06-05 MED ORDER — SODIUM CHLORIDE 0.9 % IV SOLN: 250.0000 mL | INTRAVENOUS | Status: DC | PRN

## 2017-06-05 MED ORDER — MORPHINE SULFATE (PF) 4 MG/ML IV SOLN
2.0000 mg | INTRAVENOUS | Status: DC | PRN
  Administered 2017-06-05 – 2017-06-10 (×28): 2 mg via INTRAVENOUS
  Filled 2017-06-05 (×30): qty 1

## 2017-06-05 MED ORDER — ACETAMINOPHEN 325 MG PO TABS
650.0000 mg | ORAL_TABLET | Freq: Four times a day (QID) | ORAL | Status: DC | PRN
  Administered 2017-06-07: 650 mg via ORAL
  Filled 2017-06-05 (×2): qty 2

## 2017-06-05 MED ORDER — SODIUM CHLORIDE 0.9 % IV SOLN
INTRAVENOUS | Status: DC
  Administered 2017-06-05 – 2017-06-08 (×4): via INTRAVENOUS

## 2017-06-05 MED ORDER — SODIUM CHLORIDE 0.9% FLUSH: 3.0000 mL | INTRAVENOUS | Status: DC | PRN

## 2017-06-05 MED ORDER — INSULIN ASPART 100 UNIT/ML ~~LOC~~ SOLN: 0.0000 [IU] | Freq: Every day | SUBCUTANEOUS | Status: DC

## 2017-06-05 MED ORDER — ADULT MULTIVITAMIN W/MINERALS CH
1.0000 | ORAL_TABLET | Freq: Every day | ORAL | Status: DC
  Administered 2017-06-05 – 2017-06-10 (×5): 1 via ORAL
  Filled 2017-06-05 (×5): qty 1

## 2017-06-05 MED ORDER — OXYCODONE HCL 5 MG PO TABS
5.0000 mg | ORAL_TABLET | ORAL | Status: DC | PRN
  Administered 2017-06-05 – 2017-06-10 (×25): 5 mg via ORAL
  Filled 2017-06-05 (×25): qty 1

## 2017-06-05 MED ORDER — METRONIDAZOLE IN NACL 5-0.79 MG/ML-% IV SOLN
500.0000 mg | Freq: Three times a day (TID) | INTRAVENOUS | Status: DC
  Administered 2017-06-05 – 2017-06-10 (×14): 500 mg via INTRAVENOUS
  Filled 2017-06-05 (×19): qty 100

## 2017-06-05 MED ORDER — SODIUM CHLORIDE 0.9% FLUSH
3.0000 mL | Freq: Two times a day (BID) | INTRAVENOUS | Status: DC
  Administered 2017-06-05 – 2017-06-09 (×5): 3 mL via INTRAVENOUS

## 2017-06-05 MED ORDER — MORPHINE SULFATE (PF) 4 MG/ML IV SOLN
4.0000 mg | Freq: Once | INTRAVENOUS | Status: AC
  Administered 2017-06-05: 4 mg via INTRAVENOUS
  Filled 2017-06-05: qty 1

## 2017-06-05 MED ORDER — LISINOPRIL 20 MG PO TABS
20.0000 mg | ORAL_TABLET | Freq: Every day | ORAL | Status: DC
  Administered 2017-06-06 – 2017-06-10 (×4): 20 mg via ORAL
  Filled 2017-06-05 (×4): qty 1

## 2017-06-05 MED ORDER — VITAMIN B-1 100 MG PO TABS
100.0000 mg | ORAL_TABLET | Freq: Every day | ORAL | Status: DC
  Administered 2017-06-06 – 2017-06-10 (×4): 100 mg via ORAL
  Filled 2017-06-05 (×4): qty 1

## 2017-06-05 NOTE — ED Notes (Signed)
Pt also has healing bruise to left eye, reports he became car sick driving and pulled over, got out of his car and had a fall. Denies additional falls.

## 2017-06-05 NOTE — ED Provider Notes (Signed)
[facility] EMERGENCY DEPARTMENT Provider Note   CSN: [id number] Arrival date & time: 06/04/17  1605     History   Chief Complaint Chief Complaint  Patient presents with  . Wound Infection  . Foot Pain    HPI [name] is a 52 y.o. male.  HPI  [name] is a 52 y.o. male, with a history of cellulitis, DM, EtOH abuse, and polysubstance abuse, presenting to the ED with right second toe pain worsening over the past week.  Also endorses worsening swelling and erythema, combined with subjective fever and drainage from the wound.  He has been on doxycycline for at least the last 10 days.  Has been taking oxycodone, but this no longer helps.  Pain is sharp, 10/10, radiating into the foot.  He is scheduled for right second toe amputation on May 22 with Dr. Sharol Given. Last evaluated by Dr. Sharol Given on 5/14.  Denies numbness, weakness, nausea/vomiting, or any other complaints.   Past Medical History:  Diagnosis Date  . Anxiety   . Arthritis    "everywhere" (04/21/2012)  . Cellulitis and abscess of foot 12/19/2013   LEFT FOOT  . Chronic pain   . DDD (degenerative disc disease)   . Depression   . Diabetes mellitus without complication (HCC)    borderline  . Diabetic foot ulcer ([place])   . ETOH abuse   . GERD (gastroesophageal reflux disease)    tums  . History of blood transfusion    "related to left knee OR; probably right hip too" (04/21/2012)  . Mental disorder   . Neuromuscular disorder (HCC)    neuropathy  . Noncompliance   . Open wound    bottom of foot  . Pneumonia ~ 2012  . Polysubstance abuse (HCC)    etoh, cocaine, heroin  . Stroke [facility]) 2008   "they said I might have had one during right hip replacement" (04/21/2012)    Patient Active Problem List   Diagnosis Date Noted  . Toe osteomyelitis, right ([place]) 06/05/2017  . Chronic ulcer of right foot limited to breakdown of skin ([place])   . SVT (supraventricular tachycardia) ([place]) 08/17/2016  . Tobacco use  disorder 10/25/2014  . Diabetes mellitus type 2 in nonobese (HCC)   . Swelling   . Type 2 diabetes mellitus with left diabetic foot ulcer ([place]) 12/18/2013  . Diabetes mellitus due to underlying condition with foot ulcer ([place]) 12/18/2013  . Left leg cellulitis 12/18/2013  . Cellulitis of right lower extremity 07/19/2013  . Cocaine abuse ([place]) 03/29/2012  . Generalized anxiety disorder 03/29/2012  . Panic attacks 03/29/2012  . Alcohol dependence ([place]) 08/05/2011    Class: Chronic  . Substance abuse ([place]) 05/31/2011  . Foot ulcer, left ([place]) 02/15/2011  . Fasting hyperglycemia 02/15/2011  . Neuropathic ulcer of foot ([place]) 02/15/2011    Past Surgical History:  Procedure Laterality Date  . JOINT REPLACEMENT    . KNEE ARTHROSCOPY Bilateral 1980's/1990's  . LUNG LOBECTOMY Left ~ 2006  . LUNG LOBECTOMY    . METATARSAL OSTEOTOMY  10/29/2011   Procedure: METATARSAL OSTEOTOMY;  Surgeon: [name], MD;  Location: [facility];  Service: Orthopedics;  Laterality: Left;  Left 1st Metatarsal Dorsal Closing Wedge   . METATARSAL OSTEOTOMY Right 04/28/2017   Procedure: RIGHT 1ST METATARSAL DORSAL CLOSING WEDGE OSTEOTOMY;  Surgeon: [name], MD;  Location: [facility];  Service: Orthopedics;  Laterality: Right;  . REVISION TOTAL HIP ARTHROPLASTY Right 2008   "4-5 months after  replacement" (04/21/2012)  . TOTAL HIP ARTHROPLASTY Right 2008  . TOTAL KNEE ARTHROPLASTY Left 2006  . TOTAL KNEE ARTHROPLASTY Right 04/20/2012  . TOTAL KNEE ARTHROPLASTY Right 04/20/2012   Procedure: TOTAL KNEE ARTHROPLASTY;  Surgeon: [name], MD;  Location: [facility];  Service: Orthopedics;  Laterality: Right;  Right Total Knee Arthroplasty        Home Medications    Prior to Admission medications   Medication Sig Start Date End Date Taking? Authorizing Provider  Cholecalciferol (VITAMIN D3) 5000 units CAPS Take 10,000 Units by mouth daily.   Yes [provider]  dapagliflozin propanediol (FARXIGA) 10 MG TABS tablet  Take 10 mg by mouth daily.   Yes [provider]  diltiazem (CARDIZEM) 30 MG tablet Take 1 tablet (30 mg total) by mouth 2 (two) times daily. 09/10/16  Yes Lendon Colonel, NP  doxycycline (VIBRA-TABS) 100 MG tablet Take 1 tablet (100 mg total) by mouth 2 (two) times daily. 05/24/17  Yes [name], MD  ibuprofen (ADVIL,MOTRIN) 200 MG tablet Take 400 mg by mouth every 6 (six) hours as needed for headache or moderate pain.   Yes [provider]  lisinopril (PRINIVIL,ZESTRIL) 20 MG tablet Take 20 mg by mouth daily.   Yes [provider]  metFORMIN (GLUCOPHAGE) 500 MG tablet Take 1 tablet (500 mg total) by mouth 2 (two) times daily with a meal. Patient taking differently: Take 1,000 mg by mouth 2 (two) times daily with a meal.  01/15/14  Yes Philemon Kingdom, MD  naproxen (NAPROSYN) 500 MG tablet Take 500 mg by mouth 2 (two) times daily. 04/05/17  Yes [provider]  oxyCODONE (OXY IR/ROXICODONE) 5 MG immediate release tablet Take 5 mg by mouth 3 (three) times daily.   Yes [provider]  oxyCODONE-acetaminophen (PERCOCET/ROXICET) 5-325 MG tablet Take 1 tablet by mouth every 8 (eight) hours as needed for severe pain. 06/01/17  Yes [name], MD  QUEtiapine (SEROQUEL) 100 MG tablet Take 100 mg by mouth at bedtime.   Yes [provider]  rosuvastatin (CRESTOR) 20 MG tablet Take 20 mg by mouth daily. 01/20/17  Yes [provider]  blood glucose meter kit and supplies KIT Dispense based on patient and insurance preference. Use up to four times daily as directed. (FOR ICD-9 250.00, 250.01). 08/18/16   Orvan Falconer, MD  gabapentin (NEURONTIN) 400 MG capsule Take 1 capsule (400 mg total) by mouth 4 (four) times daily. For substance withdrawal syndrome Patient not taking: Reported on 06/05/2017 03/29/13   Lindell Spar I, NP  QUEtiapine (SEROQUEL) 200 MG tablet Take 1 tablet (200 mg total) by mouth at bedtime. For mood control Patient not taking:  Reported on 06/05/2017 03/29/13   Lindell Spar I, NP  thiamine 100 MG tablet Take 1 tablet (100 mg total) by mouth daily. Patient not taking: Reported on 06/05/2017 08/19/16   Orvan Falconer, MD  traMADol (ULTRAM) 50 MG tablet Take 1 tablet (50 mg total) by mouth every 6 (six) hours as needed. Patient not taking: Reported on 06/05/2017 01/08/17   Milton Ferguson, MD    Family History Family History  Problem Relation Age of Onset  . Diabetes Mother   . Diabetes Father     Social History Social History   Tobacco Use  . Smoking status: Current Every Day Smoker    Packs/day: 0.50    Years: 30.00    Pack years: 15.00    Types: Cigarettes  . Smokeless tobacco: Never Used  Substance Use Topics  . Alcohol use: No    Alcohol/week: 0.0 oz    Frequency: Never  . Drug use: No    Types: Cocaine    Comment: Cocaine and heroin      Allergies   Benadryl [diphenhydramine hcl] and Trazodone and nefazodone   Review of Systems Review of Systems  Constitutional: Positive for fever.  Respiratory: Negative for shortness of breath.   Cardiovascular: Negative for chest pain.  Gastrointestinal: Negative for nausea and vomiting.  Musculoskeletal: Positive for arthralgias.  Skin: Positive for wound.  Neurological: Negative for weakness and numbness.  All other systems reviewed and are negative.    Physical Exam Updated Vital Signs BP 122/81 (BP Location: Right Arm)   Pulse 97   Temp 98 F (36.7 C) (Oral)   Resp 16   Ht _0  (1.905 m)   Wt 81.6 kg (180 lb)   SpO2 98%   BMI 22.50 kg/m   Physical Exam  Constitutional: He appears well-developed and well-nourished. No distress.  HENT:  Head: Normocephalic and atraumatic.  Eyes: Conjunctivae are normal.  Neck: Neck supple.  Cardiovascular: Normal rate, regular rhythm, normal heart sounds and intact distal pulses.  Pulmonary/Chest: Effort normal and breath sounds normal. No respiratory distress.  Abdominal: Soft. There is no tenderness.  There is no guarding.  Musculoskeletal: He exhibits edema and tenderness.  Tenderness to the right second toe with associated erythema and edema.  Ulceration to the plantar surface of the toe.  No noted active exudate.  Erythema spreading proximally along the dorsal surface of the foot.  Lymphadenopathy:    He has no cervical adenopathy.  Neurological: He is alert.  No noted acute sensory deficits to the right foot. Motor function intact in the toes of the right foot.  Skin: Skin is warm and dry. He is not diaphoretic.  Psychiatric: He has a normal mood and affect. His behavior is normal.  Nursing note and vitals reviewed.              ED Treatments / Results  Labs (all labs ordered are listed, but only abnormal results are displayed) Labs Reviewed  CBC WITH DIFFERENTIAL/PLATELET - Abnormal; Notable for the following components:      Result Value   WBC 16.6 (*)    RDW 16.9 (*)    Neutro Abs 11.7 (*)    All other components within normal limits  COMPREHENSIVE METABOLIC PANEL - Abnormal; Notable for the following components:   Sodium 134 (*)    Chloride 98 (*)    Glucose, Bld 137 (*)    ALT 13 (*)    All other components within normal limits  MRSA PCR SCREENING  SEDIMENTATION RATE  C-REACTIVE PROTEIN  I-STAT CG4 LACTIC ACID, ED    EKG None  Radiology Dg Foot Complete Right  Result Date: 06/04/2017 CLINICAL DATA:  Right second toe wound, rule out osteomyelitis. EXAM: RIGHT FOOT COMPLETE - 3+ VIEW COMPARISON:  01/08/2017 FINDINGS: Interval osteotomy and staple fixation at the base of the first metatarsal. Extensive soft tissue swelling of the second toe. Deep ulcer extending down to the distal phalanx. No evidence of osteomyelitis. IMPRESSION: Soft tissue swelling of the second toe without osteomyelitis. Electronically Signed   By: [name] Gallo M.D.   On: 06/04/2017 19:01    Procedures Procedures (including critical care time)  Medications Ordered in  ED Medications  oxyCODONE-acetaminophen (PERCOCET/ROXICET) 5-325 MG per tablet 1 tablet (1 tablet Oral Given 06/04/17 1754)  vancomycin (VANCOCIN)  1,500 mg in sodium chloride 0.9 % 500 mL IVPB (1,500 mg Intravenous New Bag/Given 06/05/17 0828)  morphine 4 MG/ML injection 4 mg (4 mg Intravenous Given 06/05/17 0824)  ondansetron (ZOFRAN) injection 4 mg (4 mg Intravenous Given 06/05/17 0824)  sodium chloride 0.9 % bolus 1,000 mL (0 mLs Intravenous Stopped 06/05/17 0914)     Initial Impression / Assessment and Plan / ED Course  I have reviewed the triage vital signs and the nursing notes.  Pertinent labs & imaging results that were available during my care of the patient were reviewed by me and considered in my medical decision making (see chart for details).  Clinical Course as of Jun 05 917  Sat Jun 05, 2017  0719 Spoke with Dr. Marlou Sa, on call for [facility]. Admit to medicine service, IV antibiotics, and he will see the patient during admission.   [SJ]  [id number] Spoke with Dr. Sarajane Jews, hospitalist. Agrees to admit the patient.   [SJ]    Clinical Course User Index [SJ] Tylyn Stankovich C, PA-C    Patient presents with worsening right second toe wound, despite antibiotic therapy.  Leukocytosis noted, but no fever, tachycardia, hypotension, or lactic acidosis.  Patient qualifies as failure of outpatient antibiotic therapy.  Admission for IV antibiotics with orthopedic surgery consulting.  Findings and plan of care discussed with [facility], DO.   Vitals:   06/05/17 0424 06/05/17 0815 06/05/17 0830 06/05/17 0845  BP: 122/81 117/78 112/90 126/88  Pulse: 97 83 84 86  Resp: _0 Temp: 98 F (36.7 C)     TempSrc: Oral     SpO2: 98% 100% 100% 100%  Weight:      Height:         Final Clinical Impressions(s) / ED Diagnoses   Final diagnoses:  Right foot pain  Wound infection    ED Discharge Orders    None       Layla Maw 06/05/17 0919    Ward, Delice Bison,  DO 06/05/17 2312

## 2017-06-05 NOTE — Progress Notes (Addendum)
Initial Nutrition Assessment  DOCUMENTATION CODES:   Not applicable  INTERVENTION:    Pro-stat 30 ml BID with meals, each supplement provides 100 kcal and 15 gm protein   MVI daily  NUTRITION DIAGNOSIS:   Increased nutrient needs related to wound healing as evidenced by estimated needs.  GOAL:   Patient will meet greater than or equal to 90% of their needs  MONITOR:   PO intake, Supplement acceptance, Skin  REASON FOR ASSESSMENT:   Consult Wound healing  ASSESSMENT:   52 yo male with PMH of DM-2, DM foot ulcer, noncompliance, ETOH abuse, neuropathy, stroke, DDD, and polysubstance abuse who was admitted on 5/17 with R foot second toe infection, cellulitis, and probable osteomyelitis.   Patient reports ~40 lb weight loss since starting on Farxiga and increase in Metformin dose. 220 lbs one year ago, down to 180 lbs now. 18% weight loss within one year, not significant for the time frame. He has a good appetite and has been eating well at home. He agreed to receive Pro-stat supplement to ensure adequate protein intake to support healing. Discussed with patient ways to increase dietary protein intake. PTA, plans for amputation on 5/22. Currently receiving IV antibiotics. Labs reviewed. Sodium 134 (L) CBG's: 109-123 Meds include Novolog, thiamine, rocephin, and flagyl.  NUTRITION - FOCUSED PHYSICAL EXAM:    Most Recent Value  Orbital Region  No depletion  Upper Arm Region  No depletion  Thoracic and Lumbar Region  No depletion  Buccal Region  No depletion  Temple Region  No depletion  Clavicle Bone Region  Mild depletion  Clavicle and Acromion Bone Region  No depletion  Scapular Bone Region  No depletion  Dorsal Hand  No depletion  Patellar Region  Mild depletion  Anterior Thigh Region  Unable to assess  Posterior Calf Region  Mild depletion  Edema (RD Assessment)  None  Hair  Reviewed  Eyes  Reviewed  Mouth  Reviewed  Skin  Reviewed  Nails  Reviewed        Diet Order:   Diet Order           Diet NPO time specified  Diet effective midnight        Diet Carb Modified Fluid consistency: Thin; Room service appropriate? Yes  Diet effective now          EDUCATION NEEDS:   Education needs have been addressed  Skin:  Skin Assessment: Skin Integrity Issues: Skin Integrity Issues:: Diabetic Ulcer Diabetic Ulcer: R foot second toe  Last BM:  5/18  Height:   Ht Readings from Last 1 Encounters:  06/04/17 6\' 3"  (1.905 m)    Weight:   Wt Readings from Last 1 Encounters:  06/04/17 180 lb (81.6 kg)    Ideal Body Weight:  89.1 kg  BMI:  Body mass index is 22.5 kg/m.  Estimated Nutritional Needs:   Kcal:  2200-2400  Protein:  115-130 gm  Fluid:  2.2-2.4 L    Molli Barrows, RD, LDN, CNSC Pager [phone] After Hours Pager [phone]

## 2017-06-05 NOTE — H&P (Signed)
History and Physical  Theseus Birnie GBT:[id number] DOB: [birth year] DOA: 06/04/2017  PCP: Lucia Gaskins, MD   Chief Complaint: Foot pain  HPI:  52 year old man PMH diabetes mellitus type 2 not on insulin, diabetic foot ulcer followed by orthopedics, noncompliance, presented to the emergency department with increasing right foot pain and drainage.  Admitted for right foot second toe infection, cellulitis, probable osteomyelitis.  Patient has been followed by Dr. Sharol Given for right second toe ulcer, treated with doxycycline, with plans for elective amputation 5/22.  In the interval time.  He has had increased drainage from his right second toe, increased pain and swelling.  Pain has become severe.  No alleviating factors noted.  No nausea or vomiting.  He is run out of several of his medications.  ED Course: Found to have infected right second toe ulcer with swelling of the foot and cellulitis of the foot.  Treated with vancomycin, morphine, Zofran, Percocet.  Review of Systems:  Negative for fever, sore throat, rash, new muscle aches, chest pain, SOB, dysuria, bleeding, n/v/abdominal pain.  Positive for blurred vision  Past Medical History:  Diagnosis Date  . Anxiety   . Arthritis    "everywhere" (04/21/2012)  . Cellulitis and abscess of foot 12/19/2013   LEFT FOOT  . Chronic pain   . DDD (degenerative disc disease)   . Depression   . Diabetes mellitus without complication (HCC)    borderline  . Diabetic foot ulcer ([place])   . ETOH abuse   . GERD (gastroesophageal reflux disease)    tums  . History of blood transfusion    "related to left knee OR; probably right hip too" (04/21/2012)  . Mental disorder   . Neuromuscular disorder (HCC)    neuropathy  . Noncompliance   . Open wound    bottom of foot  . Pneumonia ~ 2012  . Polysubstance abuse (HCC)    etoh, cocaine, heroin  . Stroke [facility]) 2008   "they said I might have had one during right hip replacement" (04/21/2012)    Past  Surgical History:  Procedure Laterality Date  . JOINT REPLACEMENT    . KNEE ARTHROSCOPY Bilateral 1980's/1990's  . LUNG LOBECTOMY Left ~ 2006  . LUNG LOBECTOMY    . METATARSAL OSTEOTOMY  10/29/2011   Procedure: METATARSAL OSTEOTOMY;  Surgeon: [name], MD;  Location: [place];  Service: Orthopedics;  Laterality: Left;  Left 1st Metatarsal Dorsal Closing Wedge   . METATARSAL OSTEOTOMY Right 04/28/2017   Procedure: RIGHT 1ST METATARSAL DORSAL CLOSING WEDGE OSTEOTOMY;  Surgeon: [name], MD;  Location: [place];  Service: Orthopedics;  Laterality: Right;  . REVISION TOTAL HIP ARTHROPLASTY Right 2008   "4-5 months after replacement" (04/21/2012)  . TOTAL HIP ARTHROPLASTY Right 2008  . TOTAL KNEE ARTHROPLASTY Left 2006  . TOTAL KNEE ARTHROPLASTY Right 04/20/2012  . TOTAL KNEE ARTHROPLASTY Right 04/20/2012   Procedure: TOTAL KNEE ARTHROPLASTY;  Surgeon: [name], MD;  Location: [place];  Service: Orthopedics;  Laterality: Right;  Right Total Knee Arthroplasty     reports that he has been smoking cigarettes.  He has a 15.00 pack-year smoking history. He has never used smokeless tobacco. He reports that he does not drink alcohol or use drugs.   Allergies  Allergen Reactions  . Benadryl [Diphenhydramine Hcl] Other (See Comments)    Leg spasms   . Trazodone And Nefazodone Other (See Comments)    Leg spasms     Family History  Problem Relation Age  of Onset  . Diabetes Mother   . Diabetes Father      Prior to Admission medications   Medication Sig Start Date End Date Taking? Authorizing Provider  Cholecalciferol (VITAMIN D3) 5000 units CAPS Take 10,000 Units by mouth daily.   Yes [provider]  dapagliflozin propanediol (FARXIGA) 10 MG TABS tablet Take 10 mg by mouth daily.   Yes [provider]  diltiazem (CARDIZEM) 30 MG tablet Take 1 tablet (30 mg total) by mouth 2 (two) times daily. 09/10/16  Yes Lendon Colonel, NP  doxycycline (VIBRA-TABS) 100 MG tablet  Take 1 tablet (100 mg total) by mouth 2 (two) times daily. 05/24/17  Yes [name], MD  ibuprofen (ADVIL,MOTRIN) 200 MG tablet Take 400 mg by mouth every 6 (six) hours as needed for headache or moderate pain.   Yes [provider]  lisinopril (PRINIVIL,ZESTRIL) 20 MG tablet Take 20 mg by mouth daily.   Yes [provider]  metFORMIN (GLUCOPHAGE) 500 MG tablet Take 1 tablet (500 mg total) by mouth 2 (two) times daily with a meal. Patient taking differently: Take 1,000 mg by mouth 2 (two) times daily with a meal.  01/15/14  Yes Philemon Kingdom, MD  naproxen (NAPROSYN) 500 MG tablet Take 500 mg by mouth 2 (two) times daily. 04/05/17  Yes [provider]  oxyCODONE (OXY IR/ROXICODONE) 5 MG immediate release tablet Take 5 mg by mouth 3 (three) times daily.   Yes [provider]  oxyCODONE-acetaminophen (PERCOCET/ROXICET) 5-325 MG tablet Take 1 tablet by mouth every 8 (eight) hours as needed for severe pain. 06/01/17  Yes [name], MD  QUEtiapine (SEROQUEL) 100 MG tablet Take 100 mg by mouth at bedtime.   Yes [provider]  rosuvastatin (CRESTOR) 20 MG tablet Take 20 mg by mouth daily. 01/20/17  Yes [provider]  blood glucose meter kit and supplies KIT Dispense based on patient and insurance preference. Use up to four times daily as directed. (FOR ICD-9 250.00, 250.01). 08/18/16   Orvan Falconer, MD  gabapentin (NEURONTIN) 400 MG capsule Take 1 capsule (400 mg total) by mouth 4 (four) times daily. For substance withdrawal syndrome Patient not taking: Reported on 06/05/2017 03/29/13   Lindell Spar I, NP  QUEtiapine (SEROQUEL) 200 MG tablet Take 1 tablet (200 mg total) by mouth at bedtime. For mood control Patient not taking: Reported on 06/05/2017 03/29/13   Lindell Spar I, NP  thiamine 100 MG tablet Take 1 tablet (100 mg total) by mouth daily. Patient not taking: Reported on 06/05/2017 08/19/16   Orvan Falconer, MD  traMADol (ULTRAM) 50 MG tablet Take 1  tablet (50 mg total) by mouth every 6 (six) hours as needed. Patient not taking: Reported on 06/05/2017 01/08/17   Milton Ferguson, MD    Physical Exam: Vitals:   06/05/17 0815 06/05/17 0830  BP: 117/78 112/90  Pulse: 83 84  Resp: 17 18  Temp:    SpO2: 100% 100%    Constitutional:   Appears calm and comfortable Eyes:  pupils and irises appear normal Normal lids  ENMT:  grossly normal hearing  Lips appear normal Neck:  neck appears normal, no masses no thyromegaly Respiratory:  CTA bilaterally, no w/r/r.  Respiratory effort normal. Cardiovascular:  RRR, no m/r/g No LE extremity edema   Abdomen:  Abdomen appears normal; no tenderness or masses No hernias Musculoskeletal:  Digits/nails BUE: no clubbing, cyanosis, petechiae, infection RUE, LUE, RLE, LLE   strength and tone normal, no  atrophy, no abnormal movements Skin:  Right foot is somewhat erythematous and edematous.  Right second toe with a large wet ulcer at the tip.  There is also an ulcer over the first metatarsal head on the plantar surface.  A similar ulcer seen on the left foot plantar surface. palpation of skin: no induration or nodules Psychiatric:  Mental status Mood, affect appropriate judgment and insight appear normal   I have personally reviewed following labs and imaging studies  Labs:   Basic metabolic panel unremarkable.  LFTs unremarkable.  Lactic acid within normal limits  WBC 16.6  Imaging studies:   X-ray right foot.  Soft tissue swelling second toe without osteomyelitis   Review and summation of old records:   Followed by orthopedics for nonpressure chronic ulcer right foot, osteomyelitis second toe right foot with associated diabetes mellitus on insulin.  Seen 5/14 the postoperative shoe placed.  Surgery for amputation of second right toe planned electively 5/22.  Principal Problem:   Right second toe ulcer ([place]) Active Problems:   Foot ulcer, left ([place])   Neuropathic ulcer of  foot ([place])   Cellulitis of right lower extremity   Type 2 diabetes mellitus with left diabetic foot ulcer (HCC)   Toe osteomyelitis, right (HCC)   Assessment/Plan Right second toe diabetic ulcer with infection, cellulitis of the foot, suspect underlying osteomyelitis, with associated leukocytosis but no evidence of sepsis. --Given clinical worsening and failure of outpatient treatment with doxycycline, admit for IV antibiotics and orthopedic consultation which has been placed by the emergency department.  Diabetes mellitus type 2 on insulin --Random blood sugar 137.  Sliding scale insulin.  Alcohol, polysubstance abuse per chart --Denies recent use.  Severity of Illness: The appropriate patient status for this patient is INPATIENT. Inpatient status is judged to be reasonable and necessary in order to provide the required intensity of service to ensure the patient's safety. The patient's presenting symptoms, physical exam findings, and initial radiographic and laboratory data in the context of their chronic comorbidities is felt to place them at high risk for further clinical deterioration. Furthermore, it is not anticipated that the patient will be medically stable for discharge from the hospital within 2 midnights of admission. The following factors support the patient status of inpatient.   * I certify that at the point of admission it is my clinical judgment that the patient will require inpatient hospital care spanning beyond 2 midnights from the point of admission due to high intensity of service, high risk for further deterioration and high frequency of surveillance required.*   DVT prophylaxis:SCDs Code Status: full Family Communication: none Consults called: ortho    Time spent: 49 minutes  Murray Hodgkins, MD  [facility] Direct contact: [phone] --[place] [place]  --[email or web address]; password [email or web address]  7PM-7AM contact night coverage as above  06/05/2017, 9:13  AM

## 2017-06-05 NOTE — Progress Notes (Signed)
Transferred from ED, alert, oriented x4. No signs of distress

## 2017-06-06 ENCOUNTER — Encounter (HOSPITAL_COMMUNITY): Payer: Self-pay | Admitting: *Deleted

## 2017-06-06 DIAGNOSIS — M79671 Pain in right foot: Secondary | ICD-10-CM

## 2017-06-06 DIAGNOSIS — L97509 Non-pressure chronic ulcer of other part of unspecified foot with unspecified severity: Secondary | ICD-10-CM

## 2017-06-06 LAB — CBC
HEMATOCRIT: 40.4 % (ref 39.0–52.0)
Hemoglobin: 13.1 g/dL (ref 13.0–17.0)
MCH: 27.5 pg (ref 26.0–34.0)
MCHC: 32.4 g/dL (ref 30.0–36.0)
MCV: 84.7 fL (ref 78.0–100.0)
Platelets: 280 10*3/uL (ref 150–400)
RBC: 4.77 MIL/uL (ref 4.22–5.81)
RDW: 17.2 % — AB (ref 11.5–15.5)
WBC: 12.4 10*3/uL — ABNORMAL HIGH (ref 4.0–10.5)

## 2017-06-06 LAB — BASIC METABOLIC PANEL
Anion gap: 9 (ref 5–15)
BUN: 11 mg/dL (ref 6–20)
CO2: 27 mmol/L (ref 22–32)
Calcium: 9 mg/dL (ref 8.9–10.3)
Chloride: 103 mmol/L (ref 101–111)
Creatinine, Ser: 0.78 mg/dL (ref 0.61–1.24)
GFR calc Af Amer: >60 mL/min (ref >60)
GLUCOSE: 140 mg/dL — AB (ref 65–99)
POTASSIUM: 4.2 mmol/L (ref 3.5–5.1)
Sodium: 139 mmol/L (ref 135–145)

## 2017-06-06 LAB — GLUCOSE, CAPILLARY
GLUCOSE-CAPILLARY: 169 mg/dL — AB (ref 65–99)
Glucose-Capillary: 153 mg/dL — ABNORMAL HIGH (ref 65–99)
Glucose-Capillary: 155 mg/dL — ABNORMAL HIGH (ref 65–99)
Glucose-Capillary: 141 mg/dL — ABNORMAL HIGH (ref 65–99)

## 2017-06-06 MED ORDER — VANCOMYCIN HCL 10 G IV SOLR
1500.0000 mg | INTRAVENOUS | Status: DC
  Filled 2017-06-06: qty 1500

## 2017-06-06 MED ORDER — SENNOSIDES-DOCUSATE SODIUM 8.6-50 MG PO TABS
2.0000 | ORAL_TABLET | Freq: Once | ORAL | Status: AC
  Administered 2017-06-06: 2 via ORAL
  Filled 2017-06-06: qty 2

## 2017-06-06 MED ORDER — SODIUM CHLORIDE 0.9 % IV SOLN
1500.0000 mg | Freq: Two times a day (BID) | INTRAVENOUS | Status: DC
  Administered 2017-06-06 – 2017-06-10 (×7): 1500 mg via INTRAVENOUS
  Filled 2017-06-06 (×9): qty 1500

## 2017-06-06 NOTE — Progress Notes (Signed)
Inpatient Diabetes Program Recommendations  AACE/ADA: New Consensus Statement on Inpatient Glycemic Control (2015)  Target Ranges:  Prepandial:   less than 140 mg/dL      Peak postprandial:   less than 180 mg/dL (1-2 hours)      Critically ill patients:  140 - 180 mg/dL   Lab Results  Component Value Date   GLUCAP 155 (H) 06/06/2017   HGBA1C 10.3 (H) 08/17/2016    Review of Glycemic Control  Diabetes history: DM2 Outpatient Diabetes medications: metformin 500 mg bid Current orders for Inpatient glycemic control: Novolog 0-9 units tidwc and hs  Awaiting updated HgbA1C results. Blood sugars trending well at present.  No recommendations at present.  Will follow.   Thank you. Lorenda Peck, RD, LDN, CDE Inpatient Diabetes Coordinator [phone]

## 2017-06-06 NOTE — Progress Notes (Signed)
Patient had surgery 3 weeks ago on the metatarsal of the right foot.  He has subsequently developed a right second toe ulcer.  The second toe will need treatment. Dr Sharol Given to consult in am Ok to eat today

## 2017-06-06 NOTE — Progress Notes (Signed)
Pharmacy Antibiotic Note  [name] is a 52 y.o. male admitted on 06/04/2017 with foot pain, found to have right second toe cellulitis.  Imaging negative for osteo.  He received one dose of vancomycin the ED yesterday and was continued to Rocephin and Flagyl.  Pharmacy has been consulted to continue vancomycin.  Renal function is stable, afebrile, WBC improved to 12.4.  Plan: Vanc 1500mg  IV Q12H for goal trough 10-15 mcg/mL CTX 2gm IV Q24H and Flagyl 500mg  IV Q8H per MD Monitor renal fxn, clinical progress, vanc trough as indicated   Height: 6\' 3"  (190.5 cm) Weight: 180 lb (81.6 kg) IBW/kg (Calculated) : 84.5  Temp (24hrs), Avg:97.9 F (36.6 C), Min:97.4 F (36.3 C), Max:98.3 F (36.8 C)  Recent Labs  Lab 06/04/17 1811 06/04/17 1820 06/05/17 0757 06/06/17 0535  WBC 16.6*  --   --  12.4*  CREATININE  --   --  0.86 0.78  LATICACIDVEN  --  1.89  --   --     Estimated Creatinine Clearance: 121.8 mL/min (by C-G formula based on SCr of 0.78 mg/dL).    Allergies  Allergen Reactions  . Benadryl [Diphenhydramine Hcl] Other (See Comments)    Leg spasms   . Trazodone And Nefazodone Other (See Comments)    Leg spasms     Vanc x1 5/18, con't 5/19 >> Flagyl 5/18 >>  CTX  5/18 >> Doxy PTA  5/18 MRSA PCR - negative   Casimiro Lienhard D. Mina Marble, PharmD, BCPS, [place] Pager:  [phone] 06/06/2017, 1:28 PM

## 2017-06-06 NOTE — Progress Notes (Signed)
PROGRESS NOTE  [name] NGE:[id number] DOB: 11-24-1965 DOA: 06/04/2017 PCP: Lucia Gaskins, MD   LOS: 1 day   Brief Narrative / Interim history: 52 year old male with diabetes mellitus, poorly controlled and with medications, diabetic ulcer followed by orthopedic surgery, came to the ED with increasing right foot pain and drainage.  He was admitted for cellulitis and orthopedic surgery was consulted.  Assessment & Plan: Principal Problem:   Right second toe ulcer ([place]) Active Problems:   Foot ulcer, left (HCC)   Neuropathic ulcer of foot ([place])   Cellulitis of right lower extremity   Type 2 diabetes mellitus with left diabetic foot ulcer ([place])   Toe osteomyelitis, right ([place])   Right second toe diabetic ulcer -With surrounding cellulitis, patient was started on vancomycin/ceftriaxone/metronidazole, orthopedic surgery consulted, appreciate input.  Dr. 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   Objective: Vitals:   06/05/17 1145 06/05/17 1211 06/05/17 2146 06/06/17 0700  BP: 118/85 108/77 120/74 (!) 116/59  Pulse: 81 77 (!) 106 92  Resp: (!) 23     Temp:  97.9 F (36.6 C) 98.3 F (36.8 C) (!) 97.4 F (36.3 C)  TempSrc:  Oral Oral Oral  SpO2: 100% 100% 91% 100%  Weight:      Height:        Intake/Output Summary (Last 24 hours) at 06/06/2017 1119 Last data filed at 06/06/2017 0534 Gross per 24 hour  Intake 1090 ml  Output -  Net 1090 ml   Filed Weights   06/04/17  1724  Weight: 81.6 kg (180 lb)    Examination:  Constitutional: NAD Eyes: lids and conjunctivae normal ENMT: Mucous membranes are moist.  Respiratory: clear to auscultation bilaterally, no wheezing, no crackles. Normal respiratory effort. No accessory muscle use.  Cardiovascular: Regular rate and rhythm, no murmurs / rubs / gallops. No LE edema. 2+ pedal pulses. No carotid bruits.  Abdomen: no tenderness. Bowel sounds positive.  Skin: second right tow with visible ulcer, surrounding cellulitis  Neurologic: non focal  Psychiatric: Normal judgment and insight. Alert and oriented x 3. Normal mood.    Data Reviewed: I have independently reviewed following labs and imaging studies   CBC: Recent Labs  Lab 06/04/17 1811 06/06/17 0535  WBC 16.6* 12.4*  NEUTROABS 11.7*  --   HGB 14.7 13.1  HCT 44.3 40.4  MCV 82.3 84.7  PLT PLT CLUMPING NOTED, COLLECT IN CITRATE FOR CBC 401   Basic Metabolic Panel: Recent Labs  Lab 06/05/17 0757 06/06/17 0535  NA 134* 139  K 3.9 4.2  CL 98* 103  CO2 25 27  GLUCOSE 137* 140*  BUN 15 11  CREATININE 0.86 0.78  CALCIUM 9.3 9.0   GFR: Estimated Creatinine Clearance: 121.8 mL/min (by C-G formula based on SCr of 0.78 mg/dL). Liver Function Tests: Recent Labs  Lab 06/05/17 0757  AST 21  ALT 13*  ALKPHOS 88  BILITOT 0.4  PROT 6.6  ALBUMIN 3.8   No results for input(s): LIPASE, AMYLASE in the  last 168 hours. No results for input(s): AMMONIA in the last 168 hours. Coagulation Profile: No results for input(s): INR, PROTIME in the last 168 hours. Cardiac Enzymes: No results for input(s): CKTOTAL, CKMB, CKMBINDEX, TROPONINI in the last 168 hours. BNP (last 3 results) No results for input(s): PROBNP in the last 8760 hours. HbA1C: No results for input(s): HGBA1C in the last 72 hours. CBG: Recent Labs  Lab 06/05/17 1113 06/05/17 1222 06/05/17 1650 06/05/17 2145 06/06/17 0735  GLUCAP 109* 123* 209* 160* 153*   Lipid Profile: No  results for input(s): CHOL, HDL, LDLCALC, TRIG, CHOLHDL, LDLDIRECT in the last 72 hours. Thyroid Function Tests: No results for input(s): TSH, T4TOTAL, FREET4, T3FREE, THYROIDAB in the last 72 hours. Anemia Panel: No results for input(s): VITAMINB12, FOLATE, FERRITIN, TIBC, IRON, RETICCTPCT in the last 72 hours. Urine analysis:    Component Value Date/Time   COLORURINE STRAW (A) 09/04/2016 0741   APPEARANCEUR CLEAR 09/04/2016 0741   LABSPEC 1.001 (L) 09/04/2016 0741   PHURINE 6.0 09/04/2016 0741   GLUCOSEU >=500 (A) 09/04/2016 0741   HGBUR NEGATIVE 09/04/2016 0741   BILIRUBINUR NEGATIVE 09/04/2016 0741   KETONESUR 5 (A) 09/04/2016 0741   PROTEINUR NEGATIVE 09/04/2016 0741   UROBILINOGEN 0.2 12/18/2013 1402   NITRITE NEGATIVE 09/04/2016 0741   LEUKOCYTESUR NEGATIVE 09/04/2016 0741   Sepsis Labs: Invalid input(s): PROCALCITONIN, LACTICIDVEN  Recent Results (from the past 240 hour(s))  MRSA PCR Screening     Status: None   Collection Time: 06/05/17 12:32 PM  Result Value Ref Range Status   MRSA by PCR NEGATIVE NEGATIVE Final    Comment:        The GeneXpert MRSA Assay (FDA approved for NASAL specimens only), is one component of a comprehensive MRSA colonization surveillance program. It is not intended to diagnose MRSA infection nor to guide or monitor treatment for MRSA infections. Performed at [facility], [place] [address], [place], [place] [id number]       Radiology Studies: Dg Foot Complete Right  Result Date: 06/04/2017 CLINICAL DATA:  Right second toe wound, rule out osteomyelitis. EXAM: RIGHT FOOT COMPLETE - 3+ VIEW COMPARISON:  01/08/2017 FINDINGS: Interval osteotomy and staple fixation at the base of the first metatarsal. Extensive soft tissue swelling of the second toe. Deep ulcer extending down to the distal phalanx. No evidence of osteomyelitis. IMPRESSION: Soft tissue swelling of the second toe without osteomyelitis. Electronically Signed   By: [name] Gallo M.D.   On: 06/04/2017 19:01     Scheduled Meds: . feeding supplement (PRO-STAT SUGAR FREE 64)  30 mL Oral BID  . insulin aspart  0-5 Units Subcutaneous QHS  . insulin aspart  0-9 Units Subcutaneous TID WC  . lisinopril  20 mg Oral Daily  . multivitamin with minerals  1 tablet Oral Daily  . rosuvastatin  20 mg Oral q1800  . sodium chloride flush  3 mL Intravenous Q12H  . thiamine  100 mg Oral Daily   Continuous Infusions: . sodium chloride    . sodium chloride 75 mL/hr at 06/05/17 1542  . cefTRIAXone (ROCEPHIN)  IV Stopped (06/06/17 1013)   And  . metronidazole 500 mg (06/06/17 1116)    Marzetta Board, MD, PhD [facility] Pager [phone]  If 7PM-7AM, please contact night-coverage [email or web address] Password [email or web address] 06/06/2017, 11:19 AM

## 2017-06-06 NOTE — Consult Note (Signed)
[facility] Nurse wound consult note Reason for Consult: dressings for right second toe DFU Wound type: DFU  POA: Yes Measurement: 1.0 cm x 1.0 cm x 0.5 cm Wound bed: Erythematous, edematous, induration Drainage (amount, consistency, odor)  No drainage, no odor Periwound: Edematous.  Probable osteomyelitis per note from D. Sarajane Jews 06/05/17 at 9:13 am.  Awaiting orthopedic consultation. Dressing procedure/placement/frequency: UNTIL surgery provides surgical orders for the right second toe, perform the following:  Wash with soap and water.  Pat dry.  Apply dry gauze. Monitor the wound area(s) for worsening of condition such as: Signs/symptoms of infection,  Increase in size,  Development of or worsening of odor, Development of pain, or increased pain at the affected locations.  Notify the medical team if any of these develop.  Thank you for the consult.  Discussed plan of care with the patient and bedside nurse.  [facility] nurse will not follow at this time.  Please re-consult the [facility] team if needed.  Val Riles, RN, MSN, CWOCN, CNS-BC, pager [phone]

## 2017-06-07 DIAGNOSIS — L97519 Non-pressure chronic ulcer of other part of right foot with unspecified severity: Secondary | ICD-10-CM

## 2017-06-07 DIAGNOSIS — M86671 Other chronic osteomyelitis, right ankle and foot: Secondary | ICD-10-CM

## 2017-06-07 DIAGNOSIS — M86271 Subacute osteomyelitis, right ankle and foot: Secondary | ICD-10-CM

## 2017-06-07 LAB — GLUCOSE, CAPILLARY
GLUCOSE-CAPILLARY: 170 mg/dL — AB (ref 65–99)
GLUCOSE-CAPILLARY: 155 mg/dL — AB (ref 65–99)
GLUCOSE-CAPILLARY: 100 mg/dL — AB (ref 65–99)
Glucose-Capillary: 137 mg/dL — ABNORMAL HIGH (ref 65–99)
Glucose-Capillary: 95 mg/dL (ref 65–99)

## 2017-06-07 LAB — HEMOGLOBIN A1C
Hgb A1c MFr Bld: 6.7 % — ABNORMAL HIGH (ref 4.8–5.6)
Mean Plasma Glucose: 145.59 mg/dL

## 2017-06-07 NOTE — Progress Notes (Signed)
PROGRESS NOTE  Hays Dunnigan JJK:[id number] DOB: Jun 01, 1965 DOA: 06/04/2017 PCP: Lucia Gaskins, MD   LOS: 2 days   Brief Narrative / Interim history: 52 year old male with diabetes mellitus, poorly controlled and with medications, diabetic ulcer followed by orthopedic surgery, came to the ED with increasing right foot pain and drainage.  He was admitted for cellulitis and orthopedic surgery was consulted.  Assessment & Plan: Principal Problem:   Right second toe ulcer ([place]) Active Problems:   Foot ulcer, left (HCC)   Neuropathic ulcer of foot ([place])   Cellulitis of right lower extremity   Type 2 diabetes mellitus with left diabetic foot ulcer (HCC)   Toe osteomyelitis, right (HCC)   Chronic osteomyelitis of toe, right (HCC)   Right second toe diabetic ulcer -With surrounding cellulitis, patient was started on vancomycin/ceftriaxone/metronidazole, orthopedic surgery consulted, appreciate input.   -for surgery this Wed  DM2 -A1C 6.7, CBG 170s, continue current regimen   Alcohol use, in remission -has not had anything to drink in 3 years  HTN -continue home medications, BP stable 106/73 today    DVT prophylaxis: SCDs Code Status: Full code Family Communication: no family at bedside Disposition Plan: TBD  Consultants:   Orthopedic surgery   Procedures:   None   Antimicrobials:  Vancomycin 5/18 >>  Ceftriaxone 5/18 >>  Metronidazole 5/18 >>   Subjective: -feeling well, eating breakfast. - no chest pain, shortness of breath, no abdominal pain, nausea or vomiting.   Objective: Vitals:   06/06/17 1358 06/06/17 2100 06/07/17 0530 06/07/17 0605  BP: 111/67 119/78 109/83 106/73  Pulse: 94 88 81 93  Resp: 16 16 16    Temp: 99.5 F (37.5 C) (!) 97.4 F (36.3 C) 98.4 F (36.9 C) 97.8 F (36.6 C)  TempSrc: Oral Oral Oral Oral  SpO2: 100% 100% 100% 99%  Weight:      Height:       No intake or output data in the 24 hours ending 06/07/17 1147 Filed Weights   06/04/17 1724  Weight: 81.6 kg (180 lb)    Examination:  Constitutional: no distress Eyes: no scleral icterus ENMT: mmm Respiratory: CTA biL, no wheezing Cardiovascular: RRR, no MRG Abdomen: soft, NT, ND, BS + Neurologic: non focal   Data Reviewed: I have independently reviewed following labs and imaging studies   CBC: Recent Labs  Lab 06/04/17 1811 06/06/17 0535  WBC 16.6* 12.4*  NEUTROABS 11.7*  --   HGB 14.7 13.1  HCT 44.3 40.4  MCV 82.3 84.7  PLT PLT CLUMPING NOTED, COLLECT IN CITRATE FOR CBC 371   Basic Metabolic Panel: Recent Labs  Lab 06/05/17 0757 06/06/17 0535  NA 134* 139  K 3.9 4.2  CL 98* 103  CO2 25 27  GLUCOSE 137* 140*  BUN 15 11  CREATININE 0.86 0.78  CALCIUM 9.3 9.0   GFR: Estimated Creatinine Clearance: 121.8 mL/min (by C-G formula based on SCr of 0.78 mg/dL). Liver Function Tests: Recent Labs  Lab 06/05/17 0757  AST 21  ALT 13*  ALKPHOS 88  BILITOT 0.4  PROT 6.6  ALBUMIN 3.8   No results for input(s): LIPASE, AMYLASE in the last 168 hours. No results for input(s): AMMONIA in the last 168 hours. Coagulation Profile: No results for input(s): INR, PROTIME in the last 168 hours. Cardiac Enzymes: No results for input(s): CKTOTAL, CKMB, CKMBINDEX, TROPONINI in the last 168 hours. BNP (last 3 results) No results for input(s): PROBNP in the last 8760 hours. HbA1C: Recent Labs  06/07/17 0432  HGBA1C 6.7*   CBG: Recent Labs  Lab 06/06/17 0735 06/06/17 1204 06/06/17 1642 06/06/17 2206 06/07/17 0844  GLUCAP 153* 169* 155* 141* 170*   Lipid Profile: No results for input(s): CHOL, HDL, LDLCALC, TRIG, CHOLHDL, LDLDIRECT in the last 72 hours. Thyroid Function Tests: No results for input(s): TSH, T4TOTAL, FREET4, T3FREE, THYROIDAB in the last 72 hours. Anemia Panel: No results for input(s): VITAMINB12, FOLATE, FERRITIN, TIBC, IRON, RETICCTPCT in the last 72 hours. Urine analysis:    Component Value Date/Time   COLORURINE  STRAW (A) 09/04/2016 0741   APPEARANCEUR CLEAR 09/04/2016 0741   LABSPEC 1.001 (L) 09/04/2016 0741   PHURINE 6.0 09/04/2016 0741   GLUCOSEU >=500 (A) 09/04/2016 0741   HGBUR NEGATIVE 09/04/2016 0741   BILIRUBINUR NEGATIVE 09/04/2016 0741   KETONESUR 5 (A) 09/04/2016 0741   PROTEINUR NEGATIVE 09/04/2016 0741   UROBILINOGEN 0.2 12/18/2013 1402   NITRITE NEGATIVE 09/04/2016 0741   LEUKOCYTESUR NEGATIVE 09/04/2016 0741   Sepsis Labs: Invalid input(s): PROCALCITONIN, LACTICIDVEN  Recent Results (from the past 240 hour(s))  MRSA PCR Screening     Status: None   Collection Time: 06/05/17 12:32 PM  Result Value Ref Range Status   MRSA by PCR NEGATIVE NEGATIVE Final    Comment:        The GeneXpert MRSA Assay (FDA approved for NASAL specimens only), is one component of a comprehensive MRSA colonization surveillance program. It is not intended to diagnose MRSA infection nor to guide or monitor treatment for MRSA infections. Performed at [facility], [place] [address], [place], [place] [address]       Radiology Studies: No results found.   Scheduled Meds: . feeding supplement (PRO-STAT SUGAR FREE 64)  30 mL Oral BID  . insulin aspart  0-5 Units Subcutaneous QHS  . insulin aspart  0-9 Units Subcutaneous TID WC  . lisinopril  20 mg Oral Daily  . multivitamin with minerals  1 tablet Oral Daily  . rosuvastatin  20 mg Oral q1800  . sodium chloride flush  3 mL Intravenous Q12H  . thiamine  100 mg Oral Daily   Continuous Infusions: . sodium chloride    . sodium chloride 75 mL/hr at 06/05/17 1542  . cefTRIAXone (ROCEPHIN)  IV Stopped (06/07/17 1043)   And  . metronidazole Stopped (06/07/17 0341)  . vancomycin Stopped (06/07/17 0359)    Marzetta Board, MD, PhD [facility] Pager [phone]  If 7PM-7AM, please contact night-coverage [email or web address] Password [facility] 06/07/2017, 11:47 AM

## 2017-06-07 NOTE — Care Management Note (Signed)
Case Management Note  Patient Details  Name: [name] MRN: [id number] Date of Birth: 1965-02-22  Subjective/Objective:                    Action/Plan:  Consult for home health needs. Plan amputation of right second toe on 06-09-17 and possible discharge home 06-10-17 . Will await post op PT evaluation and wound care orders. Will need home health orders and face to face. Expected Discharge Date:                  Expected Discharge Plan:     In-House Referral:     Discharge planning Services  CM Consult  Post Acute Care Choice:  Durable Medical Equipment, Home Health Choice offered to:     DME Arranged:    DME Agency:     HH Arranged:    HH Agency:     Status of Service:  In process, will continue to follow  If discussed at Long Length of Stay Meetings, dates discussed:    Additional Comments:  Marilu Favre, RN 06/07/2017, 8:37 AM

## 2017-06-07 NOTE — H&P (View-Only) (Signed)
ORTHOPAEDIC CONSULTATION  REQUESTING PHYSICIAN: Caren Griffins, MD  Chief Complaint: Ascending cellulitis from osteomyelitis right second toe.  HPI: [name] is a 52 y.o. male who presents with chronic ulceration and osteomyelitis right second toe.  Patient has undergone prolonged conservative therapy for the infected second toe right foot.  Patient presented at this time with ascending cellulitis from the right foot second toe.  Patient was placed on IV antibiotics and he states that the ascending cellulitis has resolved well.  Past Medical History:  Diagnosis Date  . Anxiety   . Arthritis    "everywhere" (04/21/2012)  . Cellulitis and abscess of foot 12/19/2013   LEFT FOOT  . Chronic pain   . DDD (degenerative disc disease)   . Depression   . Diabetes mellitus without complication (HCC)    borderline  . Diabetic foot ulcer ([place])   . ETOH abuse   . GERD (gastroesophageal reflux disease)    tums  . History of blood transfusion    "related to left knee OR; probably right hip too" (04/21/2012)  . Mental disorder   . Neuromuscular disorder (HCC)    neuropathy  . Noncompliance   . Open wound    bottom of foot  . Pneumonia ~ 2012  . Polysubstance abuse (HCC)    etoh, cocaine, heroin  . Stroke [facility]) 2008   "they said I might have had one during right hip replacement" (04/21/2012)   Past Surgical History:  Procedure Laterality Date  . JOINT REPLACEMENT    . KNEE ARTHROSCOPY Bilateral 1980's/1990's  . LUNG LOBECTOMY Left ~ 2006  . LUNG LOBECTOMY    . METATARSAL OSTEOTOMY  10/29/2011   Procedure: METATARSAL OSTEOTOMY;  Surgeon: [name], MD;  Location: [place];  Service: Orthopedics;  Laterality: Left;  Left 1st Metatarsal Dorsal Closing Wedge   . METATARSAL OSTEOTOMY Right 04/28/2017   Procedure: RIGHT 1ST METATARSAL DORSAL CLOSING WEDGE OSTEOTOMY;  Surgeon: [name], MD;  Location: [place];  Service: Orthopedics;  Laterality: Right;  . REVISION TOTAL HIP  ARTHROPLASTY Right 2008   "4-5 months after replacement" (04/21/2012)  . TOTAL HIP ARTHROPLASTY Right 2008  . TOTAL KNEE ARTHROPLASTY Left 2006  . TOTAL KNEE ARTHROPLASTY Right 04/20/2012  . TOTAL KNEE ARTHROPLASTY Right 04/20/2012   Procedure: TOTAL KNEE ARTHROPLASTY;  Surgeon: [name], MD;  Location: [place];  Service: Orthopedics;  Laterality: Right;  Right Total Knee Arthroplasty   Social History   Socioeconomic History  . Marital status: Divorced    Spouse name: Not on file  . Number of children: Not on file  . Years of education: Not on file  . Highest education level: Not on file  Occupational History  . Not on file  Social Needs  . Financial resource strain: Not on file  . Food insecurity:    Worry: Not on file    Inability: Not on file  . Transportation needs:    Medical: Not on file    Non-medical: Not on file  Tobacco Use  . Smoking status: Current Every Day Smoker    Packs/day: 0.50    Years: 30.00    Pack years: 15.00    Types: Cigarettes  . Smokeless tobacco: Never Used  Substance and Sexual Activity  . Alcohol use: No    Alcohol/week: 0.0 oz    Frequency: Never  . Drug use: No    Types: Cocaine    Comment: Cocaine and heroin   . Sexual  activity: Yes    Birth control/protection: None  Lifestyle  . Physical activity:    Days per week: Not on file    Minutes per session: Not on file  . Stress: Not on file  Relationships  . Social connections:    Talks on phone: Not on file    Gets together: Not on file    Attends religious service: Not on file    Active member of club or organization: Not on file    Attends meetings of clubs or organizations: Not on file    Relationship status: Not on file  Other Topics Concern  . Not on file  Social History Narrative  . Not on file   Family History  Problem Relation Age of Onset  . Diabetes Mother   . Diabetes Father    - negative except otherwise stated in the family history section Allergies  Allergen  Reactions  . Benadryl [Diphenhydramine Hcl] Other (See Comments)    Leg spasms   . Trazodone And Nefazodone Other (See Comments)    Leg spasms    Prior to Admission medications   Medication Sig Start Date End Date Taking? Authorizing Provider  Cholecalciferol (VITAMIN D3) 5000 units CAPS Take 10,000 Units by mouth daily.   Yes [provider]  dapagliflozin propanediol (FARXIGA) 10 MG TABS tablet Take 10 mg by mouth daily.   Yes [provider]  diltiazem (CARDIZEM) 30 MG tablet Take 1 tablet (30 mg total) by mouth 2 (two) times daily. 09/10/16  Yes Lawrence, Kathryn M, NP  doxycycline (VIBRA-TABS) 100 MG tablet Take 1 tablet (100 mg total) by mouth 2 (two) times daily. 05/24/17  Yes Duda, Marcus V, MD  ibuprofen (ADVIL,MOTRIN) 200 MG tablet Take 400 mg by mouth every 6 (six) hours as needed for headache or moderate pain.   Yes [provider]  lisinopril (PRINIVIL,ZESTRIL) 20 MG tablet Take 20 mg by mouth daily.   Yes [provider]  metFORMIN (GLUCOPHAGE) 500 MG tablet Take 1 tablet (500 mg total) by mouth 2 (two) times daily with a meal. Patient taking differently: Take 1,000 mg by mouth 2 (two) times daily with a meal.  01/15/14  Yes Gherghe, Cristina, MD  naproxen (NAPROSYN) 500 MG tablet Take 500 mg by mouth 2 (two) times daily. 04/05/17  Yes [provider]  oxyCODONE (OXY IR/ROXICODONE) 5 MG immediate release tablet Take 5 mg by mouth 3 (three) times daily.   Yes [provider]  oxyCODONE-acetaminophen (PERCOCET/ROXICET) 5-325 MG tablet Take 1 tablet by mouth every 8 (eight) hours as needed for severe pain. 06/01/17  Yes Duda, Marcus V, MD  QUEtiapine (SEROQUEL) 100 MG tablet Take 100 mg by mouth at bedtime.   Yes [provider]  rosuvastatin (CRESTOR) 20 MG tablet Take 20 mg by mouth daily. 01/20/17  Yes [provider]  blood glucose meter kit and supplies KIT Dispense based on patient and insurance preference. Use up  to four times daily as directed. (FOR ICD-9 250.00, 250.01). 08/18/16   Le, Peter, MD  gabapentin (NEURONTIN) 400 MG capsule Take 1 capsule (400 mg total) by mouth 4 (four) times daily. For substance withdrawal syndrome Patient not taking: Reported on 06/05/2017 03/29/13   Nwoko, Agnes I, NP  QUEtiapine (SEROQUEL) 200 MG tablet Take 1 tablet (200 mg total) by mouth at bedtime. For mood control Patient not taking: Reported on 06/05/2017 03/29/13   Nwoko, Agnes I, NP  thiamine 100 MG tablet Take 1 tablet (100 mg   total) by mouth daily. Patient not taking: Reported on 06/05/2017 08/19/16   Orvan Falconer, MD  traMADol (ULTRAM) 50 MG tablet Take 1 tablet (50 mg total) by mouth every 6 (six) hours as needed. Patient not taking: Reported on 06/05/2017 01/08/17   Milton Ferguson, MD   No results found. - pertinent xrays, CT, MRI studies were reviewed and independently interpreted  Positive ROS: All other systems have been reviewed and were otherwise negative with the exception of those mentioned in the HPI and as above.  Physical Exam: General: Alert, no acute distress Psychiatric: Patient is competent for consent with normal mood and affect Lymphatic: No axillary or cervical lymphadenopathy Cardiovascular: No pedal edema Respiratory: No cyanosis, no use of accessory musculature GI: No organomegaly, abdomen is soft and non-tender    Images:  _0 @  Labs:  Lab Results  Component Value Date   HGBA1C 6.7 (H) 06/07/2017   HGBA1C 10.3 (H) 08/17/2016   HGBA1C 7.7 (H) 12/18/2013   ESRSEDRATE 5 06/04/2017   ESRSEDRATE 110 (H) 12/19/2013   ESRSEDRATE 104 (H) 12/18/2013   CRP <0.8 06/05/2017   CRP 12.2 (H) 12/19/2013   CRP 14.9 (H) 12/18/2013   REPTSTATUS 12/24/2013 FINAL 12/18/2013   REPTSTATUS 12/24/2013 FINAL 12/18/2013   GRAMSTAIN  04/19/2011    WBC PRESENT,BOTH PMN AND MONONUCLEAR NO ORGANISMS SEEN   CULT  12/18/2013    NO GROWTH 5 DAYS Performed at [facility]   12/18/2013    NO GROWTH 5 DAYS Performed at [facility]     Lab Results  Component Value Date   ALBUMIN 3.8 06/05/2017   ALBUMIN 4.3 12/14/2016   ALBUMIN 3.7 09/19/2016    Neurologic: Patient does not have protective sensation bilateral lower extremities.   MUSCULOSKELETAL:   Skin: Examination patient has sausage digit swelling and cellulitis of the right second toe there is exposed bone.  Patient has a good dorsalis pedis pulse there is no ascending cellulitis in the foot or ankle.  There is exposed bone with chronic osteomyelitis of the second toe.  Previous radiographs show no destructive bony changes.  Assessment: Assessment: Diabetic insensate neuropathy with osteomyelitis and Waggoner grade 3 ulcer right foot second toe.  Plan: Plan: We will plan for right foot second toe amputation on Wednesday.  Risk and benefits were discussed including risk of the wound not healing.  Patient states he understands wished to proceed at this time.  Discussed that he could be discharged to home on Thursday.  Thank you for the consult and the opportunity to see Mr. Shelda Jakes, MD [facility] [phone] 7:41 AM

## 2017-06-07 NOTE — Consult Note (Signed)
ORTHOPAEDIC CONSULTATION  REQUESTING PHYSICIAN: Caren Griffins, MD  Chief Complaint: Ascending cellulitis from osteomyelitis right second toe.  HPI: [name] is a 52 y.o. male who presents with chronic ulceration and osteomyelitis right second toe.  Patient has undergone prolonged conservative therapy for the infected second toe right foot.  Patient presented at this time with ascending cellulitis from the right foot second toe.  Patient was placed on IV antibiotics and he states that the ascending cellulitis has resolved well.  Past Medical History:  Diagnosis Date  . Anxiety   . Arthritis    "everywhere" (04/21/2012)  . Cellulitis and abscess of foot 12/19/2013   LEFT FOOT  . Chronic pain   . DDD (degenerative disc disease)   . Depression   . Diabetes mellitus without complication (HCC)    borderline  . Diabetic foot ulcer ([place])   . ETOH abuse   . GERD (gastroesophageal reflux disease)    tums  . History of blood transfusion    "related to left knee OR; probably right hip too" (04/21/2012)  . Mental disorder   . Neuromuscular disorder (HCC)    neuropathy  . Noncompliance   . Open wound    bottom of foot  . Pneumonia ~ 2012  . Polysubstance abuse (HCC)    etoh, cocaine, heroin  . Stroke [facility]) 2008   "they said I might have had one during right hip replacement" (04/21/2012)   Past Surgical History:  Procedure Laterality Date  . JOINT REPLACEMENT    . KNEE ARTHROSCOPY Bilateral 1980's/1990's  . LUNG LOBECTOMY Left ~ 2006  . LUNG LOBECTOMY    . METATARSAL OSTEOTOMY  10/29/2011   Procedure: METATARSAL OSTEOTOMY;  Surgeon: [name], MD;  Location: [place];  Service: Orthopedics;  Laterality: Left;  Left 1st Metatarsal Dorsal Closing Wedge   . METATARSAL OSTEOTOMY Right 04/28/2017   Procedure: RIGHT 1ST METATARSAL DORSAL CLOSING WEDGE OSTEOTOMY;  Surgeon: [name], MD;  Location: [place];  Service: Orthopedics;  Laterality: Right;  . REVISION TOTAL HIP  ARTHROPLASTY Right 2008   "4-5 months after replacement" (04/21/2012)  . TOTAL HIP ARTHROPLASTY Right 2008  . TOTAL KNEE ARTHROPLASTY Left 2006  . TOTAL KNEE ARTHROPLASTY Right 04/20/2012  . TOTAL KNEE ARTHROPLASTY Right 04/20/2012   Procedure: TOTAL KNEE ARTHROPLASTY;  Surgeon: [name], MD;  Location: [place];  Service: Orthopedics;  Laterality: Right;  Right Total Knee Arthroplasty   Social History   Socioeconomic History  . Marital status: Divorced    Spouse name: Not on file  . Number of children: Not on file  . Years of education: Not on file  . Highest education level: Not on file  Occupational History  . Not on file  Social Needs  . Financial resource strain: Not on file  . Food insecurity:    Worry: Not on file    Inability: Not on file  . Transportation needs:    Medical: Not on file    Non-medical: Not on file  Tobacco Use  . Smoking status: Current Every Day Smoker    Packs/day: 0.50    Years: 30.00    Pack years: 15.00    Types: Cigarettes  . Smokeless tobacco: Never Used  Substance and Sexual Activity  . Alcohol use: No    Alcohol/week: 0.0 oz    Frequency: Never  . Drug use: No    Types: Cocaine    Comment: Cocaine and heroin   . Sexual  activity: Yes    Birth control/protection: None  Lifestyle  . Physical activity:    Days per week: Not on file    Minutes per session: Not on file  . Stress: Not on file  Relationships  . Social connections:    Talks on phone: Not on file    Gets together: Not on file    Attends religious service: Not on file    Active member of club or organization: Not on file    Attends meetings of clubs or organizations: Not on file    Relationship status: Not on file  Other Topics Concern  . Not on file  Social History Narrative  . Not on file   Family History  Problem Relation Age of Onset  . Diabetes Mother   . Diabetes Father    - negative except otherwise stated in the family history section Allergies  Allergen  Reactions  . Benadryl [Diphenhydramine Hcl] Other (See Comments)    Leg spasms   . Trazodone And Nefazodone Other (See Comments)    Leg spasms    Prior to Admission medications   Medication Sig Start Date End Date Taking? Authorizing Provider  Cholecalciferol (VITAMIN D3) 5000 units CAPS Take 10,000 Units by mouth daily.   Yes [provider]  dapagliflozin propanediol (FARXIGA) 10 MG TABS tablet Take 10 mg by mouth daily.   Yes [provider]  diltiazem (CARDIZEM) 30 MG tablet Take 1 tablet (30 mg total) by mouth 2 (two) times daily. 09/10/16  Yes Lendon Colonel, NP  doxycycline (VIBRA-TABS) 100 MG tablet Take 1 tablet (100 mg total) by mouth 2 (two) times daily. 05/24/17  Yes [name], MD  ibuprofen (ADVIL,MOTRIN) 200 MG tablet Take 400 mg by mouth every 6 (six) hours as needed for headache or moderate pain.   Yes [provider]  lisinopril (PRINIVIL,ZESTRIL) 20 MG tablet Take 20 mg by mouth daily.   Yes [provider]  metFORMIN (GLUCOPHAGE) 500 MG tablet Take 1 tablet (500 mg total) by mouth 2 (two) times daily with a meal. Patient taking differently: Take 1,000 mg by mouth 2 (two) times daily with a meal.  01/15/14  Yes Philemon Kingdom, MD  naproxen (NAPROSYN) 500 MG tablet Take 500 mg by mouth 2 (two) times daily. 04/05/17  Yes [provider]  oxyCODONE (OXY IR/ROXICODONE) 5 MG immediate release tablet Take 5 mg by mouth 3 (three) times daily.   Yes [provider]  oxyCODONE-acetaminophen (PERCOCET/ROXICET) 5-325 MG tablet Take 1 tablet by mouth every 8 (eight) hours as needed for severe pain. 06/01/17  Yes [name], MD  QUEtiapine (SEROQUEL) 100 MG tablet Take 100 mg by mouth at bedtime.   Yes [provider]  rosuvastatin (CRESTOR) 20 MG tablet Take 20 mg by mouth daily. 01/20/17  Yes [provider]  blood glucose meter kit and supplies KIT Dispense based on patient and insurance preference. Use up  to four times daily as directed. (FOR ICD-9 250.00, 250.01). 08/18/16   Orvan Falconer, MD  gabapentin (NEURONTIN) 400 MG capsule Take 1 capsule (400 mg total) by mouth 4 (four) times daily. For substance withdrawal syndrome Patient not taking: Reported on 06/05/2017 03/29/13   Lindell Spar I, NP  QUEtiapine (SEROQUEL) 200 MG tablet Take 1 tablet (200 mg total) by mouth at bedtime. For mood control Patient not taking: Reported on 06/05/2017 03/29/13   Lindell Spar I, NP  thiamine 100 MG tablet Take 1 tablet (100 mg  total) by mouth daily. Patient not taking: Reported on 06/05/2017 08/19/16   Orvan Falconer, MD  traMADol (ULTRAM) 50 MG tablet Take 1 tablet (50 mg total) by mouth every 6 (six) hours as needed. Patient not taking: Reported on 06/05/2017 01/08/17   Milton Ferguson, MD   No results found. - pertinent xrays, CT, MRI studies were reviewed and independently interpreted  Positive ROS: All other systems have been reviewed and were otherwise negative with the exception of those mentioned in the HPI and as above.  Physical Exam: General: Alert, no acute distress Psychiatric: Patient is competent for consent with normal mood and affect Lymphatic: No axillary or cervical lymphadenopathy Cardiovascular: No pedal edema Respiratory: No cyanosis, no use of accessory musculature GI: No organomegaly, abdomen is soft and non-tender    Images:  _0 @  Labs:  Lab Results  Component Value Date   HGBA1C 6.7 (H) 06/07/2017   HGBA1C 10.3 (H) 08/17/2016   HGBA1C 7.7 (H) 12/18/2013   ESRSEDRATE 5 06/04/2017   ESRSEDRATE 110 (H) 12/19/2013   ESRSEDRATE 104 (H) 12/18/2013   CRP <0.8 06/05/2017   CRP 12.2 (H) 12/19/2013   CRP 14.9 (H) 12/18/2013   REPTSTATUS 12/24/2013 FINAL 12/18/2013   REPTSTATUS 12/24/2013 FINAL 12/18/2013   GRAMSTAIN  04/19/2011    WBC PRESENT,BOTH PMN AND MONONUCLEAR NO ORGANISMS SEEN   CULT  12/18/2013    NO GROWTH 5 DAYS Performed at [facility]   12/18/2013    NO GROWTH 5 DAYS Performed at [facility]     Lab Results  Component Value Date   ALBUMIN 3.8 06/05/2017   ALBUMIN 4.3 12/14/2016   ALBUMIN 3.7 09/19/2016    Neurologic: Patient does not have protective sensation bilateral lower extremities.   MUSCULOSKELETAL:   Skin: Examination patient has sausage digit swelling and cellulitis of the right second toe there is exposed bone.  Patient has a good dorsalis pedis pulse there is no ascending cellulitis in the foot or ankle.  There is exposed bone with chronic osteomyelitis of the second toe.  Previous radiographs show no destructive bony changes.  Assessment: Assessment: Diabetic insensate neuropathy with osteomyelitis and Waggoner grade 3 ulcer right foot second toe.  Plan: Plan: We will plan for right foot second toe amputation on Wednesday.  Risk and benefits were discussed including risk of the wound not healing.  Patient states he understands wished to proceed at this time.  Discussed that he could be discharged to home on Thursday.  Thank you for the consult and the opportunity to see Mr. Shelda Jakes, MD [facility] [phone] 7:41 AM

## 2017-06-08 ENCOUNTER — Other Ambulatory Visit (INDEPENDENT_AMBULATORY_CARE_PROVIDER_SITE_OTHER): Payer: Self-pay | Admitting: Orthopedic Surgery

## 2017-06-08 DIAGNOSIS — M86271 Subacute osteomyelitis, right ankle and foot: Secondary | ICD-10-CM

## 2017-06-08 LAB — BASIC METABOLIC PANEL
ANION GAP: 7 (ref 5–15)
BUN: 19 mg/dL (ref 6–20)
CALCIUM: 9.3 mg/dL (ref 8.9–10.3)
CO2: 27 mmol/L (ref 22–32)
CREATININE: 0.79 mg/dL (ref 0.61–1.24)
Chloride: 105 mmol/L (ref 101–111)
GLUCOSE: 165 mg/dL — AB (ref 65–99)
Potassium: 4.1 mmol/L (ref 3.5–5.1)
Sodium: 139 mmol/L (ref 135–145)

## 2017-06-08 LAB — CBC
HEMATOCRIT: 35.7 % — AB (ref 39.0–52.0)
Hemoglobin: 11.6 g/dL — ABNORMAL LOW (ref 13.0–17.0)
MCH: 27.6 pg (ref 26.0–34.0)
MCHC: 32.5 g/dL (ref 30.0–36.0)
MCV: 84.8 fL (ref 78.0–100.0)
PLATELETS: 279 10*3/uL (ref 150–400)
RBC: 4.21 MIL/uL — ABNORMAL LOW (ref 4.22–5.81)
RDW: 17.7 % — AB (ref 11.5–15.5)
WBC: 11.8 10*3/uL — ABNORMAL HIGH (ref 4.0–10.5)

## 2017-06-08 LAB — GLUCOSE, CAPILLARY
GLUCOSE-CAPILLARY: 119 mg/dL — AB (ref 65–99)
Glucose-Capillary: 166 mg/dL — ABNORMAL HIGH (ref 65–99)
Glucose-Capillary: 139 mg/dL — ABNORMAL HIGH (ref 65–99)
Glucose-Capillary: 113 mg/dL — ABNORMAL HIGH (ref 65–99)

## 2017-06-08 MED ORDER — POLYETHYLENE GLYCOL 3350 17 G PO PACK
17.0000 g | PACK | Freq: Two times a day (BID) | ORAL | Status: DC
  Administered 2017-06-08 – 2017-06-10 (×4): 17 g via ORAL
  Filled 2017-06-08 (×4): qty 1

## 2017-06-08 MED ORDER — CHLORHEXIDINE GLUCONATE 4 % EX LIQD
60.0000 mL | Freq: Once | CUTANEOUS | Status: AC
  Administered 2017-06-08: 4 via TOPICAL
  Filled 2017-06-08: qty 60

## 2017-06-08 NOTE — Progress Notes (Signed)
PROGRESS NOTE  [name] GYI:[id number] DOB: Oct 05, 1965 DOA: 06/04/2017 PCP: Lucia Gaskins, MD   LOS: 3 days   Brief Narrative / Interim history: 52 year old male with diabetes mellitus, poorly controlled and with medications, diabetic ulcer followed by orthopedic surgery, came to the ED with increasing right foot pain and drainage.  He was admitted for cellulitis and orthopedic surgery was consulted. He will be taken to OR by Dr. Sharol Given on Wed.  Assessment & Plan: Principal Problem:   Right second toe ulcer ([place]) Active Problems:   Foot ulcer, left (HCC)   Neuropathic ulcer of foot ([place])   Cellulitis of right lower extremity   Type 2 diabetes mellitus with left diabetic foot ulcer (HCC)   Toe osteomyelitis, right (HCC)   Chronic osteomyelitis of toe, right (HCC)   Right second toe diabetic ulcer -With surrounding cellulitis, patient was started on vancomycin/ceftriaxone/metronidazole, orthopedic surgery consulted, appreciate input.   -for surgery tomorrow   DM2 -A1C 6.7, controlled  Alcohol use, in remission -has not had anything to drink in 3 years  HTN -continue home medications   DVT prophylaxis: SCDs Code Status: Full code Family Communication: no family at bedside Disposition Plan: home 1-2 days post op  Consultants:   Orthopedic surgery   Procedures:   None   Antimicrobials:  Vancomycin 5/18 >>  Ceftriaxone 5/18 >>  Metronidazole 5/18 >>   Subjective: - no chest pain, shortness of breath, no abdominal pain, nausea or vomiting.   Objective: Vitals:   06/07/17 0605 06/07/17 1610 06/07/17 2152 06/08/17 0446  BP: 106/73 122/81 127/72 119/75  Pulse: 93 85 85 79  Resp:   18 16  Temp: 97.8 F (36.6 C) 97.8 F (36.6 C) 98.1 F (36.7 C) 98.4 F (36.9 C)  TempSrc: Oral Oral Oral Oral  SpO2: 99% 100% 100% 100%  Weight:      Height:        Intake/Output Summary (Last 24 hours) at 06/08/2017 1226 Last data filed at 06/08/2017 0700 Gross per 24  hour  Intake 3752 ml  Output -  Net 3752 ml   Filed Weights   06/04/17 1724  Weight: 81.6 kg (180 lb)    Examination:  Constitutional: NAD Respiratory: CTA Cardiovascular: RRR  Data Reviewed: I have independently reviewed following labs and imaging studies   CBC: Recent Labs  Lab 06/04/17 1811 06/06/17 0535 06/08/17 0500  WBC 16.6* 12.4* 11.8*  NEUTROABS 11.7*  --   --   HGB 14.7 13.1 11.6*  HCT 44.3 40.4 35.7*  MCV 82.3 84.7 84.8  PLT PLT CLUMPING NOTED, COLLECT IN CITRATE FOR CBC 280 350   Basic Metabolic Panel: Recent Labs  Lab 06/05/17 0757 06/06/17 0535 06/08/17 0749  NA 134* 139 139  K 3.9 4.2 4.1  CL 98* 103 105  CO2 25 27 27   GLUCOSE 137* 140* 165*  BUN 15 11 19   CREATININE 0.86 0.78 0.79  CALCIUM 9.3 9.0 9.3   GFR: Estimated Creatinine Clearance: 121.8 mL/min (by C-G formula based on SCr of 0.79 mg/dL). Liver Function Tests: Recent Labs  Lab 06/05/17 0757  AST 21  ALT 13*  ALKPHOS 88  BILITOT 0.4  PROT 6.6  ALBUMIN 3.8   No results for input(s): LIPASE, AMYLASE in the last 168 hours. No results for input(s): AMMONIA in the last 168 hours. Coagulation Profile: No results for input(s): INR, PROTIME in the last 168 hours. Cardiac Enzymes: No results for input(s): CKTOTAL, CKMB, CKMBINDEX, TROPONINI in the last 168 hours. BNP (  last 3 results) No results for input(s): PROBNP in the last 8760 hours. HbA1C: Recent Labs    06/07/17 0432  HGBA1C 6.7*   CBG: Recent Labs  Lab 06/07/17 1655 06/07/17 2003 06/07/17 2153 06/08/17 0830 06/08/17 1136  GLUCAP 155* 95 100* 166* 119*   Lipid Profile: No results for input(s): CHOL, HDL, LDLCALC, TRIG, CHOLHDL, LDLDIRECT in the last 72 hours. Thyroid Function Tests: No results for input(s): TSH, T4TOTAL, FREET4, T3FREE, THYROIDAB in the last 72 hours. Anemia Panel: No results for input(s): VITAMINB12, FOLATE, FERRITIN, TIBC, IRON, RETICCTPCT in the last 72 hours. Urine analysis:      Component Value Date/Time   COLORURINE STRAW (A) 09/04/2016 0741   APPEARANCEUR CLEAR 09/04/2016 0741   LABSPEC 1.001 (L) 09/04/2016 0741   PHURINE 6.0 09/04/2016 0741   GLUCOSEU >=500 (A) 09/04/2016 0741   HGBUR NEGATIVE 09/04/2016 0741   BILIRUBINUR NEGATIVE 09/04/2016 0741   KETONESUR 5 (A) 09/04/2016 0741   PROTEINUR NEGATIVE 09/04/2016 0741   UROBILINOGEN 0.2 12/18/2013 1402   NITRITE NEGATIVE 09/04/2016 0741   LEUKOCYTESUR NEGATIVE 09/04/2016 0741   Sepsis Labs: Invalid input(s): PROCALCITONIN, LACTICIDVEN  Recent Results (from the past 240 hour(s))  MRSA PCR Screening     Status: None   Collection Time: 06/05/17 12:32 PM  Result Value Ref Range Status   MRSA by PCR NEGATIVE NEGATIVE Final    Comment:        The GeneXpert MRSA Assay (FDA approved for NASAL specimens only), is one component of a comprehensive MRSA colonization surveillance program. It is not intended to diagnose MRSA infection nor to guide or monitor treatment for MRSA infections. Performed at [facility], [employer] [address], [place], [place] [address]       Radiology Studies: No results found.   Scheduled Meds: . feeding supplement (PRO-STAT SUGAR FREE 64)  30 mL Oral BID  . insulin aspart  0-5 Units Subcutaneous QHS  . insulin aspart  0-9 Units Subcutaneous TID WC  . lisinopril  20 mg Oral Daily  . multivitamin with minerals  1 tablet Oral Daily  . polyethylene glycol  17 g Oral BID  . rosuvastatin  20 mg Oral q1800  . sodium chloride flush  3 mL Intravenous Q12H  . thiamine  100 mg Oral Daily   Continuous Infusions: . sodium chloride    . cefTRIAXone (ROCEPHIN)  IV Stopped (06/08/17 1025)   And  . metronidazole Stopped (06/08/17 1025)  . vancomycin Stopped (06/08/17 0306)    Marzetta Board, MD, PhD [facility] Pager [phone]  If 7PM-7AM, please contact night-coverage [email or web address] Password [email or web address] 06/08/2017, 12:26 PM

## 2017-06-09 ENCOUNTER — Ambulatory Visit (HOSPITAL_COMMUNITY): Admission: RE | Admit: 2017-06-09 | Payer: Medicare Other | Source: Ambulatory Visit | Admitting: Orthopedic Surgery

## 2017-06-09 ENCOUNTER — Inpatient Hospital Stay (HOSPITAL_COMMUNITY): Payer: Medicare Other | Admitting: Certified Registered Nurse Anesthetist

## 2017-06-09 ENCOUNTER — Encounter (HOSPITAL_COMMUNITY): Payer: Self-pay | Admitting: Orthopedic Surgery

## 2017-06-09 ENCOUNTER — Encounter (HOSPITAL_COMMUNITY)
Admission: EM | Disposition: A | Discharge status: Home Health | Payer: Self-pay | Source: Home / Self Care | Attending: Internal Medicine

## 2017-06-09 DIAGNOSIS — L97514 Non-pressure chronic ulcer of other part of right foot with necrosis of bone: Secondary | ICD-10-CM

## 2017-06-09 HISTORY — PX: AMPUTATION: SHX166

## 2017-06-09 LAB — GLUCOSE, CAPILLARY
GLUCOSE-CAPILLARY: 125 mg/dL — AB (ref 65–99)
GLUCOSE-CAPILLARY: 130 mg/dL — AB (ref 65–99)
Glucose-Capillary: 168 mg/dL — ABNORMAL HIGH (ref 65–99)
Glucose-Capillary: 106 mg/dL — ABNORMAL HIGH (ref 65–99)
Glucose-Capillary: 176 mg/dL — ABNORMAL HIGH (ref 65–99)
Glucose-Capillary: 196 mg/dL — ABNORMAL HIGH (ref 65–99)
Glucose-Capillary: 168 mg/dL — ABNORMAL HIGH (ref 65–99)

## 2017-06-09 SURGERY — AMPUTATION DIGIT
Anesthesia: General | Site: Second Toe | Laterality: Right

## 2017-06-09 MED ORDER — DOCUSATE SODIUM 100 MG PO CAPS
100.0000 mg | ORAL_CAPSULE | Freq: Two times a day (BID) | ORAL | Status: DC
  Administered 2017-06-09 – 2017-06-10 (×3): 100 mg via ORAL
  Filled 2017-06-09 (×3): qty 1

## 2017-06-09 MED ORDER — MAGNESIUM CITRATE PO SOLN: 1.0000 | Freq: Once | ORAL | Status: DC | PRN

## 2017-06-09 MED ORDER — METHOCARBAMOL 1000 MG/10ML IJ SOLN
500.0000 mg | Freq: Four times a day (QID) | INTRAVENOUS | Status: DC | PRN
  Filled 2017-06-09: qty 5

## 2017-06-09 MED ORDER — PHENYLEPHRINE 40 MCG/ML (10ML) SYRINGE FOR IV PUSH (FOR BLOOD PRESSURE SUPPORT)
PREFILLED_SYRINGE | INTRAVENOUS | Status: DC | PRN
  Administered 2017-06-09: 80 ug via INTRAVENOUS
  Administered 2017-06-09: 120 ug via INTRAVENOUS
  Administered 2017-06-09: 80 ug via INTRAVENOUS
  Administered 2017-06-09: 120 ug via INTRAVENOUS

## 2017-06-09 MED ORDER — POLYETHYLENE GLYCOL 3350 17 G PO PACK: 17.0000 g | PACK | Freq: Every day | ORAL | Status: DC | PRN

## 2017-06-09 MED ORDER — ONDANSETRON HCL 4 MG PO TABS: 4.0000 mg | ORAL_TABLET | Freq: Four times a day (QID) | ORAL | Status: DC | PRN

## 2017-06-09 MED ORDER — 0.9 % SODIUM CHLORIDE (POUR BTL) OPTIME
TOPICAL | Status: DC | PRN
  Administered 2017-06-09: 1000 mL

## 2017-06-09 MED ORDER — PROPOFOL 10 MG/ML IV BOLUS
INTRAVENOUS | Status: DC | PRN
  Administered 2017-06-09: 150 mg via INTRAVENOUS

## 2017-06-09 MED ORDER — LIDOCAINE HCL (CARDIAC) PF 100 MG/5ML IV SOSY
PREFILLED_SYRINGE | INTRAVENOUS | Status: DC | PRN
  Administered 2017-06-09: 60 mg via INTRAVENOUS

## 2017-06-09 MED ORDER — METOCLOPRAMIDE HCL 5 MG PO TABS: 5.0000 mg | ORAL_TABLET | Freq: Three times a day (TID) | ORAL | Status: DC | PRN

## 2017-06-09 MED ORDER — MORPHINE SULFATE (PF) 2 MG/ML IV SOLN
1.0000 mg | Freq: Once | INTRAVENOUS | Status: AC
  Administered 2017-06-09: 1 mg via INTRAVENOUS
  Filled 2017-06-09: qty 1

## 2017-06-09 MED ORDER — MIDAZOLAM HCL 2 MG/2ML IJ SOLN
INTRAMUSCULAR | Status: DC | PRN
Start: 2017-06-09 — End: 2017-06-09
  Administered 2017-06-09: 2 mg via INTRAVENOUS

## 2017-06-09 MED ORDER — METOCLOPRAMIDE HCL 5 MG/ML IJ SOLN: 5.0000 mg | Freq: Three times a day (TID) | INTRAMUSCULAR | Status: DC | PRN

## 2017-06-09 MED ORDER — ONDANSETRON HCL 4 MG/2ML IJ SOLN: 4.0000 mg | Freq: Four times a day (QID) | INTRAMUSCULAR | Status: DC | PRN

## 2017-06-09 MED ORDER — HYDROMORPHONE HCL 2 MG/ML IJ SOLN
0.2500 mg | INTRAMUSCULAR | Status: DC | PRN
  Administered 2017-06-09 (×4): 0.5 mg via INTRAVENOUS

## 2017-06-09 MED ORDER — HYDROMORPHONE HCL 2 MG/ML IJ SOLN
INTRAMUSCULAR | Status: AC
  Filled 2017-06-09: qty 1

## 2017-06-09 MED ORDER — CEFAZOLIN SODIUM-DEXTROSE 2-4 GM/100ML-% IV SOLN
2.0000 g | INTRAVENOUS | Status: AC
  Administered 2017-06-09: 2 g via INTRAVENOUS

## 2017-06-09 MED ORDER — BISACODYL 10 MG RE SUPP: 10.0000 mg | Freq: Every day | RECTAL | Status: DC | PRN

## 2017-06-09 MED ORDER — LACTATED RINGERS IV SOLN
INTRAVENOUS | Status: DC
  Administered 2017-06-09: 09:00:00 via INTRAVENOUS

## 2017-06-09 MED ORDER — SODIUM CHLORIDE 0.9 % IV SOLN
INTRAVENOUS | Status: DC
  Administered 2017-06-09: 15:00:00 via INTRAVENOUS

## 2017-06-09 MED ORDER — CEFAZOLIN SODIUM-DEXTROSE 2-4 GM/100ML-% IV SOLN
INTRAVENOUS | Status: AC
  Filled 2017-06-09: qty 100

## 2017-06-09 MED ORDER — ONDANSETRON HCL 4 MG/2ML IJ SOLN
INTRAMUSCULAR | Status: DC | PRN
  Administered 2017-06-09: 4 mg via INTRAVENOUS

## 2017-06-09 MED ORDER — FENTANYL CITRATE (PF) 250 MCG/5ML IJ SOLN
INTRAMUSCULAR | Status: DC | PRN
  Administered 2017-06-09 (×2): 25 ug via INTRAVENOUS

## 2017-06-09 MED ORDER — METHOCARBAMOL 500 MG PO TABS
500.0000 mg | ORAL_TABLET | Freq: Four times a day (QID) | ORAL | Status: DC | PRN
  Administered 2017-06-09 – 2017-06-10 (×3): 500 mg via ORAL
  Filled 2017-06-09 (×3): qty 1

## 2017-06-09 SURGICAL SUPPLY — 31 items
BLADE SURG 21 STRL SS (BLADE) ×3 IMPLANT
BNDG CMPR 9X4 STRL LF SNTH (GAUZE/BANDAGES/DRESSINGS)
BNDG COHESIVE 4X5 TAN STRL (GAUZE/BANDAGES/DRESSINGS) ×3 IMPLANT
BNDG ESMARK 4X9 LF (GAUZE/BANDAGES/DRESSINGS) IMPLANT
BNDG GAUZE ELAST 4 BULKY (GAUZE/BANDAGES/DRESSINGS) ×3 IMPLANT
COVER SURGICAL LIGHT HANDLE (MISCELLANEOUS) ×4 IMPLANT
DRAPE U-SHAPE 47X51 STRL (DRAPES) ×3 IMPLANT
DRSG ADAPTIC 3X8 NADH LF (GAUZE/BANDAGES/DRESSINGS) ×2 IMPLANT
DRSG PAD ABDOMINAL 8X10 ST (GAUZE/BANDAGES/DRESSINGS) ×3 IMPLANT
DURAPREP 26ML APPLICATOR (WOUND CARE) ×3 IMPLANT
ELECT REM PT RETURN 9FT ADLT (ELECTROSURGICAL) ×3
ELECTRODE REM PT RTRN 9FT ADLT (ELECTROSURGICAL) ×1 IMPLANT
GAUZE SPONGE 4X4 12PLY STRL (GAUZE/BANDAGES/DRESSINGS) ×2 IMPLANT
GLOVE BIOGEL PI IND STRL 9 (GLOVE) ×1 IMPLANT
GLOVE BIOGEL PI INDICATOR 9 (GLOVE) ×2
GLOVE SURG ORTHO 9.0 STRL STRW (GLOVE) ×3 IMPLANT
GOWN STRL REUS W/ TWL XL LVL3 (GOWN DISPOSABLE) ×2 IMPLANT
GOWN STRL REUS W/TWL XL LVL3 (GOWN DISPOSABLE) ×6
KIT BASIN OR (CUSTOM PROCEDURE TRAY) ×3 IMPLANT
KIT TURNOVER KIT B (KITS) ×3 IMPLANT
MANIFOLD NEPTUNE II (INSTRUMENTS) ×3 IMPLANT
NEEDLE 22X1 1/2 (OR ONLY) (NEEDLE) IMPLANT
NS IRRIG 1000ML POUR BTL (IV SOLUTION) ×3 IMPLANT
PACK ORTHO EXTREMITY (CUSTOM PROCEDURE TRAY) ×3 IMPLANT
PAD ARMBOARD 7.5X6 YLW CONV (MISCELLANEOUS) ×4 IMPLANT
SUT ETHILON 2 0 PSLX (SUTURE) ×3 IMPLANT
SYR CONTROL 10ML LL (SYRINGE) IMPLANT
TOWEL OR 17X26 10 PK STRL BLUE (TOWEL DISPOSABLE) ×3 IMPLANT
TUBE CONNECTING 12'X1/4 (SUCTIONS) ×1
TUBE CONNECTING 12X1/4 (SUCTIONS) ×1 IMPLANT
YANKAUER SUCT BULB TIP NO VENT (SUCTIONS) ×2 IMPLANT

## 2017-06-09 NOTE — Transfer of Care (Signed)
Immediate Anesthesia Transfer of Care Note  Patient: [name]  Procedure(s) Performed: RIGHT 2ND TOE AMPUTATION (Right Second Toe)  Patient Location: PACU  Anesthesia Type:General  Level of Consciousness: awake, alert , oriented and patient cooperative  Airway & Oxygen Therapy: Patient Spontanous Breathing  Post-op Assessment: Report given to RN, Post -op Vital signs reviewed and stable and Patient moving all extremities X 4  Post vital signs: Reviewed and stable  Last Vitals:  Vitals Value Taken Time  BP 90/61 06/09/2017 11:53 AM  Temp    Pulse 84 06/09/2017 11:54 AM  Resp 21 06/09/2017 11:54 AM  SpO2 97 % 06/09/2017 11:54 AM  Vitals shown include unvalidated device data.  Last Pain:  Vitals:   06/09/17 0836  TempSrc:   PainSc: 9          Complications: No apparent anesthesia complications

## 2017-06-09 NOTE — Progress Notes (Addendum)
Pharmacy Antibiotic Note  [name] is a 52 y.o. male admitted on 06/04/2017 with foot pain, found to have right second toe cellulitis.  Imaging negative for osteo.  He received one dose of vancomycin the ED yesterday and was continued to Rocephin and Flagyl.  Pharmacy has been consulted to continue vancomycin. S/p toe amputation 5/22. To be discharged home Thursday per Ortho.  Renal function is stable, afebrile, WBC improved.  Plan: Vanc 1500mg  IV Q12H for goal trough 15-20 mcg/mL CTX 2gm IV Q24H and Flagyl 500mg  IV Q8H per MD Monitor renal fxn, clinical progress Will not order vanc trough as patient to d/c home tomorrow per Ortho note   Height: 6\' 3"  (190.5 cm) Weight: 180 lb (81.6 kg) IBW/kg (Calculated) : 84.5  Temp (24hrs), Avg:98.2 F (36.8 C), Min:98.1 F (36.7 C), Max:98.4 F (36.9 C)  Recent Labs  Lab 06/04/17 1811 06/04/17 1820 06/05/17 0757 06/06/17 0535 06/08/17 0500 06/08/17 0749  WBC 16.6*  --   --  12.4* 11.8*  --   CREATININE  --   --  0.86 0.78  --  0.79  LATICACIDVEN  --  1.89  --   --   --   --     Estimated Creatinine Clearance: 121.8 mL/min (by C-G formula based on SCr of 0.79 mg/dL).    Allergies  Allergen Reactions  . Benadryl [Diphenhydramine Hcl] Other (See Comments)    Leg spasms   . Trazodone And Nefazodone Other (See Comments)    Leg spasms     Vanc x1 5/18, con't 5/19 >> Flagyl 5/18 >>  CTX  5/18 >> Doxy PTA  5/18 MRSA PCR - negative  Elicia Lamp, PharmD, BCPS Clinical Pharmacist Clinical phone for 06/09/2017 until 3:30pm: [phone] If after 3:30pm, please call main pharmacy at: [phone] 06/09/2017 2:41 PM

## 2017-06-09 NOTE — Interval H&P Note (Signed)
History and Physical Interval Note:  06/09/2017 9:59 AM  [name]  has presented today for surgery, with the diagnosis of Osteomyelitis Right 2nd Toe  The various methods of treatment have been discussed with the patient and family. After consideration of risks, benefits and other options for treatment, the patient has consented to  Procedure(s): RIGHT 2ND TOE AMPUTATION (Right) as a surgical intervention .  The patient's history has been reviewed, patient examined, no change in status, stable for surgery.  I have reviewed the patient's chart and labs.  Questions were answered to the patient's satisfaction.     [name]

## 2017-06-09 NOTE — Anesthesia Procedure Notes (Signed)
Procedure Name: LMA Insertion Date/Time: 06/09/2017 11:26 AM Performed by: Julieta Bellini, CRNA Pre-anesthesia Checklist: Patient identified, Emergency Drugs available, Suction available, Patient being monitored and Timeout performed Patient Re-evaluated:Patient Re-evaluated prior to induction Oxygen Delivery Method: Circle system utilized Preoxygenation: Pre-oxygenation with 100% oxygen Induction Type: IV induction Ventilation: Mask ventilation without difficulty LMA: LMA inserted LMA Size: 5.0 Number of attempts: 1 Dental Injury: Teeth and Oropharynx as per pre-operative assessment

## 2017-06-09 NOTE — Progress Notes (Signed)
Orthopedic Tech Progress Note Patient Details:  [name] Jun 03, 1965 [id number]  Ortho Devices Type of Ortho Device: Postop shoe/boot Ortho Device/Splint Location: rle Ortho Device/Splint Interventions: Application   Post Interventions Patient Tolerated: Well Instructions Provided: Care of device   Hildred Priest 06/09/2017, 1:21 PM

## 2017-06-09 NOTE — Progress Notes (Signed)
PROGRESS NOTE    [name]  ZOX:[id number] DOB: 09/14/1965 DOA: 06/04/2017 PCP: Lucia Gaskins, MD Brief Narrative:52 year old male with diabetes mellitus, poorly controlled and with medications, diabetic ulcer followed by orthopedic surgery, came to the ED with increasing right foot pain and drainage.  He was admitted for cellulitis and orthopedic surgery was consulted. He will be taken to OR by Dr. Sharol Given on Wed.    Assessment & Plan:   Principal Problem:   Right second toe ulcer ([place]) Active Problems:   Foot ulcer, left (HCC)   Neuropathic ulcer of foot ([place])   Cellulitis of right lower extremity   Type 2 diabetes mellitus with left diabetic foot ulcer (HCC)   Toe osteomyelitis, right (HCC)   Chronic osteomyelitis of toe, right (HCC)  Right second toe diabetic ulcer -With surrounding cellulitis, patient was started on vancomycin/ceftriaxone/metronidazole, orthopedic surgery consulted, appreciate input.   -S/P right second toe amputation DM2 -A1C 6.7, controlled  Alcohol use, in remission -has not had anything to drink in 3 years  HTN -continue home medications     DVT prophylaxis:scd Code Status: full Family Communication:none Disposition Plan:tbd  Consultants: ortho   Procedures:right 2 nd toe amputation 5/22  Antimicrobials: Vancomycin 5/18 >>  Ceftriaxone 5/18 >>  Metronidazole 5/18      Subjective:no complaints seen in pacu   Objective: Vitals:   06/09/17 1307 06/09/17 1322 06/09/17 1339 06/09/17 1404  BP: 114/81 111/71 108/68 125/87  Pulse: 83 95 94 92  Resp: 20 15 19    Temp:   98.4 F (36.9 C)   TempSrc:      SpO2: 100% 100% 100% 100%  Weight:      Height:        Intake/Output Summary (Last 24 hours) at 06/09/2017 1406 Last data filed at 06/09/2017 1310 Gross per 24 hour  Intake 800 ml  Output 555 ml  Net 245 ml   Filed Weights   06/04/17 1724 06/09/17 0910  Weight: 81.6 kg (180 lb) 81.6 kg (180 lb)     Examination:  General exam: Appears calm and comfortable  Respiratory system: Clear to auscultation. Respiratory effort normal. Cardiovascular system: S1 & S2 heard, RRR. No JVD, murmurs, rubs, gallops or clicks. No pedal edema. Gastrointestinal system: Abdomen is nondistended, soft and nontender. No organomegaly or masses felt. Normal bowel sounds heard. Central nervous system: Alert and oriented. No focal neurological deficits. Extremities:no edema  Skin: No rashes, lesions or ulcers Psychiatry: Judgement and insight appear normal. Mood & affect appropriate.     Data Reviewed: I have personally reviewed following labs and imaging studies  CBC: Recent Labs  Lab 06/04/17 1811 06/06/17 0535 06/08/17 0500  WBC 16.6* 12.4* 11.8*  NEUTROABS 11.7*  --   --   HGB 14.7 13.1 11.6*  HCT 44.3 40.4 35.7*  MCV 82.3 84.7 84.8  PLT PLT CLUMPING NOTED, COLLECT IN CITRATE FOR CBC 280 811   Basic Metabolic Panel: Recent Labs  Lab 06/05/17 0757 06/06/17 0535 06/08/17 0749  NA 134* 139 139  K 3.9 4.2 4.1  CL 98* 103 105  CO2 25 27 27   GLUCOSE 137* 140* 165*  BUN 15 11 19   CREATININE 0.86 0.78 0.79  CALCIUM 9.3 9.0 9.3   GFR: Estimated Creatinine Clearance: 121.8 mL/min (by C-G formula based on SCr of 0.79 mg/dL). Liver Function Tests: Recent Labs  Lab 06/05/17 0757  AST 21  ALT 13*  ALKPHOS 88  BILITOT 0.4  PROT 6.6  ALBUMIN 3.8   No results  for input(s): LIPASE, AMYLASE in the last 168 hours. No results for input(s): AMMONIA in the last 168 hours. Coagulation Profile: No results for input(s): INR, PROTIME in the last 168 hours. Cardiac Enzymes: No results for input(s): CKTOTAL, CKMB, CKMBINDEX, TROPONINI in the last 168 hours. BNP (last 3 results) No results for input(s): PROBNP in the last 8760 hours. HbA1C: Recent Labs    06/07/17 0432  HGBA1C 6.7*   CBG: Recent Labs  Lab 06/08/17 1723 06/08/17 2208 06/09/17 0752 06/09/17 1022 06/09/17 1155  GLUCAP  139* 113* 168* 106* 125*   Lipid Profile: No results for input(s): CHOL, HDL, LDLCALC, TRIG, CHOLHDL, LDLDIRECT in the last 72 hours. Thyroid Function Tests: No results for input(s): TSH, T4TOTAL, FREET4, T3FREE, THYROIDAB in the last 72 hours. Anemia Panel: No results for input(s): VITAMINB12, FOLATE, FERRITIN, TIBC, IRON, RETICCTPCT in the last 72 hours. Sepsis Labs: Recent Labs  Lab 06/04/17 1820  LATICACIDVEN 1.89    Recent Results (from the past 240 hour(s))  MRSA PCR Screening     Status: None   Collection Time: 06/05/17 12:32 PM  Result Value Ref Range Status   MRSA by PCR NEGATIVE NEGATIVE Final    Comment:        The GeneXpert MRSA Assay (FDA approved for NASAL specimens only), is one component of a comprehensive MRSA colonization surveillance program. It is not intended to diagnose MRSA infection nor to guide or monitor treatment for MRSA infections. Performed at [facility], [place] [address] [place], [place] [address]          Radiology Studies: No results found.      Scheduled Meds: . [MAR Hold] feeding supplement (PRO-STAT SUGAR FREE 64)  30 mL Oral BID  . HYDROmorphone      . [MAR Hold] insulin aspart  0-5 Units Subcutaneous QHS  . [MAR Hold] insulin aspart  0-9 Units Subcutaneous TID WC  . [MAR Hold] lisinopril  20 mg Oral Daily  . [MAR Hold] multivitamin with minerals  1 tablet Oral Daily  . [MAR Hold] polyethylene glycol  17 g Oral BID  . [MAR Hold] rosuvastatin  20 mg Oral q1800  . [MAR Hold] sodium chloride flush  3 mL Intravenous Q12H  . [MAR Hold] thiamine  100 mg Oral Daily   Continuous Infusions: . [MAR Hold] sodium chloride    . [MAR Hold] cefTRIAXone (ROCEPHIN)  IV Stopped (06/08/17 1025)   And  . [MAR Hold] metronidazole Stopped (06/09/17 0318)  . lactated ringers 10 mL/hr at 06/09/17 0918  . [MAR Hold] vancomycin Stopped (06/09/17 0418)     LOS: 4 days      Georgette Shell, MD [facility]  If  7PM-7AM, please contact night-coverage [email or web address] Password [facility] 06/09/2017, 2:06 PM

## 2017-06-09 NOTE — Anesthesia Preprocedure Evaluation (Addendum)
Anesthesia Evaluation  Patient identified by MRN, date of birth, ID band Patient awake    Reviewed: Allergy & Precautions, H&P , NPO status , Patient's Chart, lab work & pertinent test results  Airway Mallampati: I  TM Distance: >3 FB Neck ROM: Full    Dental no notable dental hx. (+) Edentulous Upper, Edentulous Lower, Dental Advisory Given   Pulmonary Current Smoker,    Pulmonary exam normal breath sounds clear to auscultation       Cardiovascular hypertension, Pt. on medications  Rhythm:Regular Rate:Normal     Neuro/Psych Anxiety Depression CVA, No Residual Symptoms    GI/Hepatic GERD  Controlled,(+)     substance abuse  alcohol use,   Endo/Other  diabetes, Type 2, Oral Hypoglycemic Agents  Renal/GU negative Renal ROS  negative genitourinary   Musculoskeletal  (+) Arthritis , Osteoarthritis,    Abdominal   Peds  Hematology negative hematology ROS (+)   Anesthesia Other Findings   Reproductive/Obstetrics negative OB ROS                             Anesthesia Physical  Anesthesia Plan  ASA: III  Anesthesia Plan: General   Post-op Pain Management:    Induction: Intravenous  PONV Risk Score and Plan: 2 and Ondansetron and Midazolam  Airway Management Planned: LMA  Additional Equipment:   Intra-op Plan:   Post-operative Plan: Extubation in OR  Informed Consent: I have reviewed the patients History and Physical, chart, labs and discussed the procedure including the risks, benefits and alternatives for the proposed anesthesia with the patient or authorized representative who has indicated his/her understanding and acceptance.   Dental advisory given  Plan Discussed with: CRNA  Anesthesia Plan Comments:         Anesthesia Quick Evaluation  

## 2017-06-09 NOTE — Care Management Note (Signed)
Case Management Note  Patient Details  Name: Hammad Finkler MRN: [id number] Date of Birth: 12-04-1965  Subjective/Objective:                    Action/Plan:  Await post op PT recommendations.   MD note : Dressing care/ Wound VAC: Dry dressing applied to be changed in 5 days  Ambulatory devices: Walker or crutches  Discharge to: Discharge to home tomorrow.    Expected Discharge Date:                  Expected Discharge Plan:     In-House Referral:     Discharge planning Services  CM Consult  Post Acute Care Choice:  Durable Medical Equipment, Home Health Choice offered to:     DME Arranged:    DME Agency:     HH Arranged:    HH Agency:     Status of Service:  In process, will continue to follow  If discussed at Long Length of Stay Meetings, dates discussed:    Additional Comments:  Marilu Favre, RN 06/09/2017, 3:53 PM

## 2017-06-09 NOTE — Anesthesia Postprocedure Evaluation (Signed)
Anesthesia Post Note  Patient: [name]  Procedure(s) Performed: RIGHT 2ND TOE AMPUTATION (Right Second Toe)     Patient location during evaluation: PACU Anesthesia Type: General Level of consciousness: awake and alert Pain management: pain level controlled Vital Signs Assessment: post-procedure vital signs reviewed and stable Respiratory status: spontaneous breathing, nonlabored ventilation and respiratory function stable Cardiovascular status: blood pressure returned to baseline and stable Postop Assessment: no apparent nausea or vomiting Anesthetic complications: no    Last Vitals:  Vitals:   06/09/17 1252 06/09/17 1307  BP: 122/78 114/81  Pulse: 85 83  Resp: 19 20  Temp:    SpO2: 99% 100%    Last Pain:  Vitals:   06/09/17 1252  TempSrc:   PainSc: 8                  Francenia Chimenti,W. EDMOND

## 2017-06-09 NOTE — Op Note (Signed)
06/09/2017  11:45 AM  PATIENT:  Corinna Capra    PRE-OPERATIVE DIAGNOSIS:  Osteomyelitis Right 2nd Toe  POST-OPERATIVE DIAGNOSIS:  Same  PROCEDURE:  RIGHT 2ND TOE AMPUTATION  SURGEON:  [name], MD  PHYSICIAN ASSISTANT:None ANESTHESIA:   General  PREOPERATIVE INDICATIONS:  Jayon Matton is a  52 y.o. male with a diagnosis of Osteomyelitis Right 2nd Toe who failed conservative measures and elected for surgical management.    The risks benefits and alternatives were discussed with the patient preoperatively including but not limited to the risks of infection, bleeding, nerve injury, cardiopulmonary complications, the need for revision surgery, among others, and the patient was willing to proceed.  OPERATIVE IMPLANTS: None.  @ENCIMAGES @  OPERATIVE FINDINGS: Good petechial bleeding no abscess at the MTP joint  OPERATIVE PROCEDURE: Patient was brought the operating room and underwent a general anesthetic.  After adequate levels of anesthesia were obtained patient's right lower extremity was prepped using DuraPrep draped into a sterile field a timeout was called.  A V incision was made just distal to the MTP joint right foot second toe.  The toe was amputated through the MTP joint there was good petechial bleeding electrocautery was used for hemostasis.  Wound was irrigated with normal saline and closed with 2-0 nylon.  A sterile dressing was applied patient was extubated taken the PACU in stable condition.   DISCHARGE PLANNING:  Antibiotic duration: 23 hours postoperatively  Weightbearing: Touchdown weightbearing on the right  Pain medication: Ordered  Dressing care/ Wound VAC: Dry dressing applied to be changed in 5 days  Ambulatory devices: Walker or crutches  Discharge to: Discharge to home tomorrow.  Follow-up: In the office 1 week post operative.

## 2017-06-10 ENCOUNTER — Other Ambulatory Visit: Payer: Self-pay

## 2017-06-10 ENCOUNTER — Encounter (HOSPITAL_COMMUNITY): Payer: Self-pay | Admitting: Orthopedic Surgery

## 2017-06-10 LAB — CBC WITH DIFFERENTIAL/PLATELET
ABS IMMATURE GRANULOCYTES: 0.1 10*3/uL (ref 0.0–0.1)
Basophils Absolute: 0.1 10*3/uL (ref 0.0–0.1)
Basophils Relative: 1 %
Eosinophils Absolute: 0.3 10*3/uL (ref 0.0–0.7)
Eosinophils Relative: 2 %
HEMATOCRIT: 36.3 % — AB (ref 39.0–52.0)
Hemoglobin: 11.8 g/dL — ABNORMAL LOW (ref 13.0–17.0)
IMMATURE GRANULOCYTES: 1 %
LYMPHS PCT: 26 %
Lymphs Abs: 2.8 10*3/uL (ref 0.7–4.0)
MCH: 27.6 pg (ref 26.0–34.0)
MCHC: 32.5 g/dL (ref 30.0–36.0)
MCV: 85 fL (ref 78.0–100.0)
MONOS PCT: 8 %
Monocytes Absolute: 0.9 10*3/uL (ref 0.1–1.0)
NEUTROS ABS: 6.8 10*3/uL (ref 1.7–7.7)
NEUTROS PCT: 62 %
PLATELETS: 294 10*3/uL (ref 150–400)
RBC: 4.27 MIL/uL (ref 4.22–5.81)
RDW: 16.9 % — ABNORMAL HIGH (ref 11.5–15.5)
WBC: 10.8 10*3/uL — AB (ref 4.0–10.5)

## 2017-06-10 LAB — BASIC METABOLIC PANEL
ANION GAP: 8 (ref 5–15)
BUN: 17 mg/dL (ref 6–20)
CALCIUM: 8.4 mg/dL — AB (ref 8.9–10.3)
CO2: 22 mmol/L (ref 22–32)
Chloride: 107 mmol/L (ref 101–111)
Creatinine, Ser: 0.75 mg/dL (ref 0.61–1.24)
GLUCOSE: 189 mg/dL — AB (ref 65–99)
POTASSIUM: 4.3 mmol/L (ref 3.5–5.1)
Sodium: 137 mmol/L (ref 135–145)

## 2017-06-10 LAB — GLUCOSE, CAPILLARY
GLUCOSE-CAPILLARY: 144 mg/dL — AB (ref 65–99)
Glucose-Capillary: 141 mg/dL — ABNORMAL HIGH (ref 65–99)

## 2017-06-10 MED ORDER — ACETAMINOPHEN 325 MG PO TABS: 650.0000 mg | ORAL_TABLET | Freq: Four times a day (QID) | ORAL | Status: DC | PRN

## 2017-06-10 MED ORDER — OXYCODONE HCL 5 MG PO TABS: 5.0000 mg | ORAL_TABLET | ORAL | 0 refills | Status: DC | PRN

## 2017-06-10 NOTE — Progress Notes (Signed)
Patient ID: [name], male   DOB: 09-04-1965, 52 y.o.   MRN: [id number] Postoperative day 1 second toe amputation right foot.  Patient's dressing is clean and dry.  Patient from an orthopedic standpoint could discharge to home at this time minimize weightbearing maximize elevation I will follow-up in the office in 1 week

## 2017-06-10 NOTE — Evaluation (Signed)
Physical Therapy Evaluation Patient Details Name: [name] MRN: [id number] DOB: [birth year] Today's Date: 06/10/2017   History of Present Illness  Brach Birdsall is a 52yo white male who comes to [facility] with foor pain, failed conservative treatment with ABX, and is now s/p amputation of 2nd toe 06/09/17. PTA pt reports chronic diabetic ulcer on left first MTP plantar surface as well, which has recently gotten worse. 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 Pt currently with functional limitations due to the deficits listed below (see "PT Problem List"). Upon entry, the patient is received in bathroom, exits AMB ad lib without assistive device, WBAT in orthotic postop shoe. The pt is awake and agreeable to participate. No acute distress noted at this time. The pt is alert and oriented x3, pleasant, conversational, and following simple and multi-step commands consistently.  Functional mobility assessment demonstrates modified independent bed mobility, transfers, and AMB in room, however patient is ultimately unable to AMB or transfer while maintaining TDWB, continues to AMB WBAT, and AMD Is ended as not to break precautions further. The pt endorses concerns about chronic bilat knee instability and insufficient arm strength to use RW efectively, and concerns about falls while performing directed hop-to TDWB gait. Of note: active heeling diabetic ulcer on nonoperative side on the plantar surface of the first MTP, a chronic issue that has recently been worsening. This area should and could be addressed with custom orthotic shoe inserts, but the patient reports he has not been able to access these services d/t time constraints. This should be addressed at DC whether in home setting or Outpatient setting. Empirically, the patient demonstrates increased risk of recurrent falls AEB gait speed <0.19m/s, forward reach <5", and multiple LOB  demonstrated throughout session. Pt will benefit from skilled PT intervention to increase independence and safety with basic mobility in preparation for discharge to the venue listed below.       Follow Up Recommendations Home health PT    Equipment Recommendations  Standard walker;Other (comment)(Pt is not appropriate for rolling walker for mobility.  Footwear/orthotic assisment is badly needed d/t Left 1st MTP chronic diabetic ulcer. )    Recommendations for Other Services       Precautions / Restrictions Precautions Precautions: Fall Required Braces or Orthoses: Other Brace/Splint Other Brace/Splint: post-op shoe  Restrictions Weight Bearing Restrictions: Yes RLE Weight Bearing: Touchdown weight bearing Other Position/Activity Restrictions: "TDWB" per ortho note      Mobility  Bed Mobility               General bed mobility comments: received in BR   Transfers Overall transfer level: Needs assistance Equipment used: Rolling walker (2 wheeled) Transfers: Sit to/from Stand Sit to Stand: Supervision;From elevated surface         General transfer comment: VC for safety, precautions, and RW use.   Ambulation/Gait Ambulation/Gait assistance: Min guard Ambulation Distance (Feet): 50 Feet Assistive device: Rolling walker (2 wheeled) Gait Pattern/deviations: Step-through pattern     General Gait Details: Pt acknowledges TDWB precautions, then is reminded 2 mote times, as he makes no visible effort to attempt TDWB or NWB. Pt ultimately says he is unable to do so after taking three steps with NWB, then decision is made to return to room since patient is unable to maintain TDWB.   Stairs            Wheelchair Mobility    Modified  [place] (Stroke Patients Only)       Balance Overall balance assessment: Modified Independent;Mild deficits observed, not formally tested                                           Pertinent Vitals/Pain Pain  Assessment: 0-10 Pain Score: 10-Worst pain ever Pain Location: Rt foot surgical site; pt appears quite pleasant and relaxed upon report.  Pain Descriptors / Indicators: Aching Pain Intervention(s): Limited activity within patient's tolerance;Monitored during session;Premedicated before session    Home Living Family/patient expects to be discharged to:: Private residence Living Arrangements: Alone Available Help at Discharge: Family;Available 24 hours/day;Available PRN/intermittently(sons live nearby) Type of Home: Mobile home Home Access: Stairs to enter Entrance Stairs-Rails: Can reach both Entrance Stairs-Number of Steps: 4 Home Layout: One level Home Equipment: Bedside commode;Cane - single point;Walker - 2 wheels;Shower seat      Prior Function Level of Independence: Independent with assistive device(s)         Comments: SPC AMB in and out of home.      Hand Dominance   Dominant Hand: Right    Extremity/Trunk Assessment   Upper Extremity Assessment Upper Extremity Assessment: Generalized weakness    Lower Extremity Assessment Lower Extremity Assessment: Generalized weakness(baseline knee buckling instbaility bilat )    Cervical / Trunk Assessment Cervical / Trunk Assessment: Normal  Communication      Cognition Arousal/Alertness: Awake/alert Behavior During Therapy: WFL for tasks assessed/performed Overall Cognitive Status: Within Functional Limits for tasks assessed                                        General Comments      Exercises     Assessment/Plan    PT Assessment Patient needs continued PT services  PT Problem List Decreased strength;Decreased activity tolerance;Decreased balance;Decreased mobility;Decreased knowledge of use of DME;Pain       PT Treatment Interventions Gait training;Functional mobility training;Therapeutic activities;Therapeutic exercise;Patient/family education    PT Goals (Current goals can be found in  the Care Plan section)  Acute Rehab PT Goals Patient Stated Goal: allow wound to heel while remaining mobile at home.  PT Goal Formulation: With patient Time For Goal Achievement: 06/24/17 Potential to Achieve Goals: Poor    Frequency Min 4X/week   Barriers to discharge Decreased caregiver support      Co-evaluation               AM-PAC PT "6 Clicks" Daily Activity  Outcome Measure Difficulty turning over in bed (including adjusting bedclothes, sheets and blankets)?: None Difficulty moving from lying on back to sitting on the side of the bed? : None Difficulty sitting down on and standing up from a chair with arms (e.g., wheelchair, bedside commode, etc,.)?: A Little Help needed moving to and from a bed to chair (including a wheelchair)?: A Little Help needed walking in hospital room?: A Little Help needed climbing 3-5 steps with a railing? : A Little 6 Click Score: 20    End of Session Equipment Utilized During Treatment: Gait belt(Ortho shoe (postop) ) Activity Tolerance: Patient limited by fatigue;Other (comment)(unable to mobilize with TDWB, AMB ended) Patient left: in chair;with call bell/phone within reach   PT Visit Diagnosis: Difficulty in walking, not elsewhere classified (R26.2);Unsteadiness on feet (R26.81);Muscle weakness (generalized) (  M62.81)    Time: 2119-4174 PT Time Calculation (min) (ACUTE ONLY): 23 min   Charges:   PT Evaluation $PT Eval High Complexity: 1 High PT Treatments $Therapeutic Activity: 8-22 mins   PT G Codes:        1:33 PM, Jun 18, 2017 Etta Grandchild, PT, DPT Physical Therapist - [place] [phone] (Pager)  [phone] (Office)     Chana Lindstrom C 06-18-17, 1:33 PM

## 2017-06-10 NOTE — Care Management Note (Signed)
Case Management Note  Patient Details  Name: [name] MRN: [id number] Date of Birth: 07/03/1965  Subjective/Objective:                    Action/Plan:  PT note incomplete. Spoke to [place] with PT who called [facility] PT recommendations are HHPT, standard walker with no wheels. PT have concerns patient will not maintain TDWB.  Discussed above with patient . Patient agreeable to HHPT , choice offered he has had [facility] in the past and would like [facility] again. Discussed walker patient does not want NCM to order walker , he thinks he has one at home, if not his sister does and he can borrow hers. Again stressed PT recommending standard walker with no wheels.   Patient voiced understanding  Expected Discharge Date:                  Expected Discharge Plan:  [place]  In-House Referral:     Discharge planning Services  CM Consult  Post Acute Care Choice:  Durable Medical Equipment, Home Health Choice offered to:  Patient  DME Arranged:    DME Agency:  NA  HH Arranged:  PT [place] Agency:  [place]  Status of Service:  Completed, signed off  If discussed at [place] of Stay Meetings, dates discussed:    Additional Comments:  Marilu Favre, RN 06/10/2017, 12:10 PM

## 2017-06-10 NOTE — Progress Notes (Signed)
Corinna Capra to be D/C'd  per MD order. Discussed with the patient and all questions fully answered.  VSS, Skin clean, dry and intact without evidence of skin break down, no evidence of skin tears noted.  IV catheter discontinued intact. Site without signs and symptoms of complications. Dressing and pressure applied.  An After Visit Summary was printed and given to the patient. Patient received prescription.  D/c education completed with patient/family including follow up instructions, medication list, d/c activities limitations if indicated, with other d/c instructions as indicated by MD - patient able to verbalize understanding, all questions fully answered.   Patient instructed to return to ED, call 911, or call MD for any changes in condition.   Patient to be escorted via [place], and D/C home via private auto.

## 2017-06-10 NOTE — Discharge Summary (Signed)
Physician Discharge Summary  [name] NTI:[id number] DOB: December 12, 1965 DOA: 06/04/2017  PCP: Lucia Gaskins, MD  Admit date: 06/04/2017 Discharge date: 06/10/2017  Admitted From: Home Disposition: Home  Recommendations for Outpatient Follow-up:  1. Follow up with PCP in 1-2 weeks 2. Please obtain BMP/CBC in one week  Home Health:yes Equipment/Devices PT recommended walker patient refused walker since he has one at home. Discharge Condition:stable CODE STATUS: Full code Diet recommendation: Carb modified Brief/Interim Summary:52 year old male with diabetes mellitus, poorly controlled and with medications, diabetic ulcer followed by orthopedic surgery, came to the ED with increasing right foot pain and drainage. He was admitted for cellulitis and orthopedic surgery was consulted. He will be taken to OR by Dr. Sharol Given on Wed.     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  Patient seen by physical therapy recommended home health PT and a walker.  Patient refused walker since he has one at home.  Continue metformin upon discharge. 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  HTN -continue home medications    Discharge Instructions  Discharge Instructions    Call MD for:  difficulty breathing, headache or visual disturbances   Complete by:  As directed    Call MD for:  persistant dizziness or light-headedness   Complete by:  As directed    Call MD for:  persistant nausea and vomiting   Complete by:  As directed    Call MD for:  severe uncontrolled pain    Complete by:  As directed    Call MD for:  temperature >100.4   Complete by:  As directed    Diet - low sodium heart healthy   Complete by:  As directed    Elevate operative extremity   Complete by:  As directed    Increase activity slowly   Complete by:  As directed    Touch down weight bearing   Complete by:  As directed    Laterality:  right   Extremity:  Lower     Allergies as of 06/10/2017      Reactions   Benadryl [diphenhydramine Hcl] Other (See Comments)   Leg spasms    Trazodone And Nefazodone Other (See Comments)   Leg spasms       Medication List    STOP taking these medications   doxycycline 100 MG tablet Commonly known as:  VIBRA-TABS   gabapentin 400 MG capsule Commonly known as:  NEURONTIN   ibuprofen 200 MG tablet Commonly known as:  ADVIL,MOTRIN   naproxen 500 MG tablet Commonly known as:  NAPROSYN   oxyCODONE-acetaminophen 5-325 MG tablet Commonly known as:  PERCOCET/ROXICET   thiamine 100 MG tablet   traMADol 50 MG tablet Commonly known as:  ULTRAM     TAKE these medications   acetaminophen 325 MG tablet Commonly known as:  TYLENOL Take 2 tablets (650 mg total) by mouth every 6 (six) hours as needed for mild pain (or Fever >/= 101).   blood glucose meter kit and supplies Kit Dispense based on patient and insurance preference. Use up to four  times daily as directed. (FOR ICD-9 250.00, 250.01).   diltiazem 30 MG tablet Commonly known as:  CARDIZEM Take 1 tablet (30 mg total) by mouth 2 (two) times daily.   FARXIGA 10 MG Tabs tablet Generic drug:  dapagliflozin propanediol Take 10 mg by mouth daily.   lisinopril 20 MG tablet Commonly known as:  PRINIVIL,ZESTRIL Take 20 mg by mouth daily.   metFORMIN 500 MG tablet Commonly known as:  GLUCOPHAGE Take 1 tablet (500 mg total) by mouth 2 (two) times daily with a meal. What changed:  how much to take   oxyCODONE 5 MG immediate release tablet Commonly known as:  Oxy IR/ROXICODONE Take  5 mg by mouth 3 (three) times daily.   QUEtiapine 100 MG tablet Commonly known as:  SEROQUEL Take 100 mg by mouth at bedtime. What changed:  Another medication with the same name was removed. Continue taking this medication, and follow the directions you see here.   rosuvastatin 20 MG tablet Commonly known as:  CRESTOR Take 20 mg by mouth daily.   Vitamin D3 5000 units Caps Take 10,000 Units by mouth daily.            Discharge Care Instructions  (From admission, onward)        Start     Ordered   06/10/17 0000  Touch down weight bearing    Question Answer Comment  Laterality right   Extremity Lower      06/10/17 1201     Follow-up Information    [name], MD In 1 week.   Specialty:  Orthopedic Surgery Contact information: [address] [place] [address] [phone]          Allergies  Allergen Reactions  . Benadryl [Diphenhydramine Hcl] Other (See Comments)    Leg spasms   . Trazodone And Nefazodone Other (See Comments)    Leg spasms     Consultations:  [facility] ortho   Procedures/Studies: Dg Foot Complete Right  Result Date: 06/04/2017 CLINICAL DATA:  Right second toe wound, rule out osteomyelitis. EXAM: RIGHT FOOT COMPLETE - 3+ VIEW COMPARISON:  01/08/2017 FINDINGS: Interval osteotomy and staple fixation at the base of the first metatarsal. Extensive soft tissue swelling of the second toe. Deep ulcer extending down to the distal phalanx. No evidence of osteomyelitis. IMPRESSION: Soft tissue swelling of the second toe without osteomyelitis. Electronically Signed   By: [name] Gallo M.D.   On: 06/04/2017 19:01    (Echo, Carotid, EGD, Colonoscopy, ERCP)    Subjective:   Discharge Exam: Vitals:   06/10/17 0333 06/10/17 0447  BP: 105/75 109/79  Pulse: 84 90  Resp:    Temp: 98.1 F (36.7 C) 97.8 F (36.6 C)  SpO2: 97% 100%   Vitals:   06/09/17 1404 06/09/17 2055 06/10/17 0333 06/10/17 0447  BP: 125/87 122/73 105/75  109/79  Pulse: 92 82 84 90  Resp:      Temp: 98.2 F (36.8 C) 98.4 F (36.9 C) 98.1 F (36.7 C) 97.8 F (36.6 C)  TempSrc: Oral Oral Oral Oral  SpO2: 100% 100% 97% 100%  Weight:      Height:        General: Pt is alert, awake, not in acute distress Cardiovascular: RRR, S1/S2 +, no rubs, no gallops Respiratory: CTA bilaterally, no wheezing, no rhonchi Abdominal: Soft, NT, ND, bowel sounds + Extremities: no edema, no cyanosis    The results of significant diagnostics from this hospitalization (including imaging, microbiology, ancillary and laboratory)  are listed below for reference.     Microbiology: Recent Results (from the past 240 hour(s))  MRSA PCR Screening     Status: None   Collection Time: 06/05/17 12:32 PM  Result Value Ref Range Status   MRSA by PCR NEGATIVE NEGATIVE Final    Comment:        The GeneXpert MRSA Assay (FDA approved for NASAL specimens only), is one component of a comprehensive MRSA colonization surveillance program. It is not intended to diagnose MRSA infection nor to guide or monitor treatment for MRSA infections. Performed at [facility], [place] [address] [place], [place] [address]      Labs: BNP (last 3 results) No results for input(s): BNP in the last 8760 hours. Basic Metabolic Panel: Recent Labs  Lab 06/05/17 0757 06/06/17 0535 06/08/17 0749 06/10/17 0550  NA 134* 139 139 137  K 3.9 4.2 4.1 4.3  CL 98* 103 105 107  CO2 _0 GLUCOSE 137* 140* 165* 189*  BUN _1 CREATININE 0.86 0.78 0.79 0.75  CALCIUM 9.3 9.0 9.3 8.4*   Liver Function Tests: Recent Labs  Lab 06/05/17 0757  AST 21  ALT 13*  ALKPHOS 88  BILITOT 0.4  PROT 6.6  ALBUMIN 3.8   No results for input(s): LIPASE, AMYLASE in the last 168 hours. No results for input(s): AMMONIA in the last 168 hours. CBC: Recent Labs  Lab 06/04/17 1811 06/06/17 0535 06/08/17 0500 06/10/17 0550  WBC 16.6* 12.4* 11.8* 10.8*  NEUTROABS 11.7*  --    --  6.8  HGB 14.7 13.1 11.6* 11.8*  HCT 44.3 40.4 35.7* 36.3*  MCV 82.3 84.7 84.8 85.0  PLT PLT CLUMPING NOTED, COLLECT IN CITRATE FOR CBC 280 279 294   Cardiac Enzymes: No results for input(s): CKTOTAL, CKMB, CKMBINDEX, TROPONINI in the last 168 hours. BNP: Invalid input(s): POCBNP CBG: Recent Labs  Lab 06/09/17 1800 06/09/17 2004 06/09/17 2142 06/10/17 0756 06/10/17 1217  GLUCAP 196* 168* 130* 141* 144*   D-Dimer No results for input(s): DDIMER in the last 72 hours. Hgb A1c No results for input(s): HGBA1C in the last 72 hours. Lipid Profile No results for input(s): CHOL, HDL, LDLCALC, TRIG, CHOLHDL, LDLDIRECT in the last 72 hours. Thyroid function studies No results for input(s): TSH, T4TOTAL, T3FREE, THYROIDAB in the last 72 hours.  Invalid input(s): FREET3 Anemia work up No results for input(s): VITAMINB12, FOLATE, FERRITIN, TIBC, IRON, RETICCTPCT in the last 72 hours. Urinalysis    Component Value Date/Time   COLORURINE STRAW (A) 09/04/2016 0741   APPEARANCEUR CLEAR 09/04/2016 0741   LABSPEC 1.001 (L) 09/04/2016 0741   PHURINE 6.0 09/04/2016 0741   GLUCOSEU >=500 (A) 09/04/2016 0741   HGBUR NEGATIVE 09/04/2016 0741   BILIRUBINUR NEGATIVE 09/04/2016 0741   KETONESUR 5 (A) 09/04/2016 0741   PROTEINUR NEGATIVE 09/04/2016 0741   UROBILINOGEN 0.2 12/18/2013 1402   NITRITE NEGATIVE 09/04/2016 0741   LEUKOCYTESUR NEGATIVE 09/04/2016 0741   Sepsis Labs Invalid input(s): PROCALCITONIN,  WBC,  LACTICIDVEN Microbiology Recent Results (from the past 240 hour(s))  MRSA PCR Screening     Status: None   Collection Time: 06/05/17 12:32 PM  Result Value Ref Range Status   MRSA by PCR NEGATIVE NEGATIVE Final    Comment:        The GeneXpert MRSA Assay (FDA approved for NASAL specimens only), is one component of a comprehensive MRSA colonization surveillance program. It is not intended to diagnose MRSA infection  nor to guide or monitor treatment for MRSA  infections. Performed at [facility], [place] [address] [place], [place] [address]      Time coordinating discharge 34 minutes  SIGNED:   Georgette Shell, MD  [facility] 06/10/2017, 12:28 PM Pager   If 7PM-7AM, please contact night-coverage [email or web address] Password [email or web address]

## 2017-06-15 ENCOUNTER — Telehealth (INDEPENDENT_AMBULATORY_CARE_PROVIDER_SITE_OTHER): Payer: Self-pay | Admitting: Orthopedic Surgery

## 2017-06-15 NOTE — Telephone Encounter (Signed)
[name] [phone] called stated seen patient this week 06/14/17. Has a concern about the left foot that appears to be an ulcer-pt not obeying orders given and has a concern.  Verbal orders needed for 2x a week for 4 weeks.  Please call as he has several concerns.

## 2017-06-15 NOTE — Telephone Encounter (Signed)
Called and sw therapist to advise verbal ok for orders. Therapist also states that the pt has an appt tomorrow and has been non compliant with non weight bearing and walking on the right foot of which he just had his toe amputated. Has advised him to use his walker for ambulation and also states that the pt has an ulcer on the plantar aspect of his left foot that has been bleeding. Will make Erin aware at appt tomorrow.

## 2017-06-16 ENCOUNTER — Encounter (INDEPENDENT_AMBULATORY_CARE_PROVIDER_SITE_OTHER): Payer: Self-pay | Admitting: Family

## 2017-06-16 ENCOUNTER — Ambulatory Visit (INDEPENDENT_AMBULATORY_CARE_PROVIDER_SITE_OTHER): Payer: Medicare Other | Admitting: Family

## 2017-06-16 VITALS — Ht 75.0 in | Wt 180.0 lb

## 2017-06-16 DIAGNOSIS — M869 Osteomyelitis, unspecified: Secondary | ICD-10-CM

## 2017-06-16 DIAGNOSIS — Z89421 Acquired absence of other right toe(s): Secondary | ICD-10-CM

## 2017-06-16 MED ORDER — OXYCODONE HCL 5 MG PO TABS: 5.0000 mg | ORAL_TABLET | Freq: Four times a day (QID) | ORAL | 0 refills | Status: DC | PRN

## 2017-06-16 NOTE — Progress Notes (Signed)
Post-Op Visit Note   Patient: [name]           Date of Birth: [birth year]           MRN: [id number] Visit Date: 06/16/2017 PCP: Lucia Gaskins, MD  Chief Complaint:  Chief Complaint  Patient presents with  . Right Foot - Routine Post Op    06/09/17 right second toe amputation     HPI:  HPI Patient is a 52 year old gentleman seen 1 week status post right 2nd toe amputation. Full weight bearing in regular shoe wear.  Ortho Exam Incision is well approximated with sutures.  No gaping, drainage. No erythema. No sign of infection.  Visit Diagnoses:  1. H/O amputation of lesser toe, right ([place])   2. Toe osteomyelitis, right (HCC)     Plan: begin daily dial soap cleansing. Apply dry dressing. Minimize weight bearing. Follow up in 1 week for suture removal.  Follow-Up Instructions: Return in about 1 week (around 06/23/2017) for suture removal.   Imaging: No results found.  Orders:  No orders of the defined types were placed in this encounter.  Meds ordered this encounter  Medications  . oxyCODONE (OXY IR/ROXICODONE) 5 MG immediate release tablet    Sig: Take 1 tablet (5 mg total) by mouth every 6 (six) hours as needed for up to 7 days for moderate pain.    Dispense:  28 tablet    Refill:  0     PMFS History: Patient Active Problem List   Diagnosis Date Noted  . Chronic osteomyelitis of toe, right ([place])   . Toe osteomyelitis, right ([place]) 06/05/2017  . Right second toe ulcer ([place]) 06/05/2017  . Chronic ulcer of right foot limited to breakdown of skin ([place])   . SVT (supraventricular tachycardia) ([place]) 08/17/2016  . Tobacco use disorder 10/25/2014  . Diabetes mellitus type 2 in nonobese (HCC)   . Swelling   . 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 Diabetes mellitus due to underlying condition with foot ulcer ([place]) 12/18/2013  . Left leg cellulitis 12/18/2013  . Cellulitis of right lower extremity 07/19/2013  . Cocaine abuse  ([place]) 03/29/2012  . Generalized anxiety disorder 03/29/2012  . Panic attacks 03/29/2012  . Alcohol dependence ([place]) 08/05/2011    Class: Chronic  . Substance abuse ([place]) 05/31/2011  . Foot ulcer, left ([place]) 02/15/2011  . Neuropathic ulcer of foot ([place]) 02/15/2011   Past Medical History:  Diagnosis Date  . Anxiety   . Arthritis    "everywhere" (04/21/2012)  . Cellulitis and abscess of foot 12/19/2013   LEFT FOOT  . Chronic pain   . DDD (degenerative disc disease)   . Depression   . Diabetes mellitus without complication (HCC)    borderline  . Diabetic foot ulcer ([place])   . ETOH abuse   . GERD (gastroesophageal reflux disease)    tums  . History of blood transfusion    "related to left knee OR; probably right hip too" (04/21/2012)  . Mental disorder   . Neuromuscular disorder (HCC)    neuropathy  . Noncompliance   . Open wound    bottom of foot  . Pneumonia ~ 2012  . Polysubstance abuse (HCC)    etoh, cocaine, heroin  . Stroke [facility]) 2008   "they said I might have had one during right hip replacement" (04/21/2012)    Family History  Problem Relation Age of Onset  . Diabetes Mother   .  Diabetes Father     Past Surgical History:  Procedure Laterality Date  . AMPUTATION Right 06/09/2017   Procedure: RIGHT 2ND TOE AMPUTATION;  Surgeon: [name], MD;  Location: [place];  Service: Orthopedics;  Laterality: Right;  . JOINT REPLACEMENT    . KNEE ARTHROSCOPY Bilateral 1980's/1990's  . LUNG LOBECTOMY Left ~ 2006  . LUNG LOBECTOMY    . METATARSAL OSTEOTOMY  10/29/2011   Procedure: METATARSAL OSTEOTOMY;  Surgeon: [name], MD;  Location: [place];  Service: Orthopedics;  Laterality: Left;  Left 1st Metatarsal Dorsal Closing Wedge   . METATARSAL OSTEOTOMY Right 04/28/2017   Procedure: RIGHT 1ST METATARSAL DORSAL CLOSING WEDGE OSTEOTOMY;  Surgeon: [name], MD;  Location: [place];  Service: Orthopedics;  Laterality: Right;  . REVISION TOTAL HIP ARTHROPLASTY Right 2008   "4-5  months after replacement" (04/21/2012)  . TOTAL HIP ARTHROPLASTY Right 2008  . TOTAL KNEE ARTHROPLASTY Left 2006  . TOTAL KNEE ARTHROPLASTY Right 04/20/2012  . TOTAL KNEE ARTHROPLASTY Right 04/20/2012   Procedure: TOTAL KNEE ARTHROPLASTY;  Surgeon: [name], MD;  Location: [place];  Service: Orthopedics;  Laterality: Right;  Right Total Knee Arthroplasty   Social History   Occupational History  . Not on file  Tobacco Use  . Smoking status: Current Every Day Smoker    Packs/day: 0.50    Years: 30.00    Pack years: 15.00    Types: Cigarettes  . Smokeless tobacco: Never Used  Substance and Sexual Activity  . Alcohol use: No    Alcohol/week: 0.0 oz    Frequency: Never  . Drug use: Yes    Types: Cocaine    Comment: Cocaine and heroin   . Sexual activity: Yes    Birth control/protection: None

## 2017-06-20 ENCOUNTER — Other Ambulatory Visit (INDEPENDENT_AMBULATORY_CARE_PROVIDER_SITE_OTHER): Payer: Self-pay | Admitting: Orthopedic Surgery

## 2017-06-22 ENCOUNTER — Other Ambulatory Visit: Payer: Self-pay

## 2017-06-22 ENCOUNTER — Observation Stay (HOSPITAL_COMMUNITY): Payer: Medicare Other

## 2017-06-22 ENCOUNTER — Emergency Department (HOSPITAL_COMMUNITY): Payer: Medicare Other

## 2017-06-22 ENCOUNTER — Telehealth (INDEPENDENT_AMBULATORY_CARE_PROVIDER_SITE_OTHER): Payer: Self-pay

## 2017-06-22 ENCOUNTER — Inpatient Hospital Stay (HOSPITAL_COMMUNITY)
Admission: EM | Admit: 2017-06-22 | Discharge: 2017-06-28 | DRG: 863 | Disposition: A | Discharge status: Home / Self Care | Payer: Medicare Other | Attending: Internal Medicine | Admitting: Internal Medicine

## 2017-06-22 ENCOUNTER — Encounter (HOSPITAL_COMMUNITY): Payer: Self-pay | Admitting: Emergency Medicine

## 2017-06-22 DIAGNOSIS — F1410 Cocaine abuse, uncomplicated: Secondary | ICD-10-CM | POA: Diagnosis present

## 2017-06-22 DIAGNOSIS — L97529 Non-pressure chronic ulcer of other part of left foot with unspecified severity: Secondary | ICD-10-CM | POA: Diagnosis present

## 2017-06-22 DIAGNOSIS — L03115 Cellulitis of right lower limb: Secondary | ICD-10-CM | POA: Diagnosis present

## 2017-06-22 DIAGNOSIS — F411 Generalized anxiety disorder: Secondary | ICD-10-CM | POA: Diagnosis not present

## 2017-06-22 DIAGNOSIS — T8149XA Infection following a procedure, other surgical site, initial encounter: Secondary | ICD-10-CM

## 2017-06-22 DIAGNOSIS — Z9119 Patient's noncompliance with other medical treatment and regimen: Secondary | ICD-10-CM | POA: Diagnosis not present

## 2017-06-22 DIAGNOSIS — F1910 Other psychoactive substance abuse, uncomplicated: Secondary | ICD-10-CM | POA: Diagnosis present

## 2017-06-22 DIAGNOSIS — Z7984 Long term (current) use of oral hypoglycemic drugs: Secondary | ICD-10-CM

## 2017-06-22 DIAGNOSIS — Z8673 Personal history of transient ischemic attack (TIA), and cerebral infarction without residual deficits: Secondary | ICD-10-CM | POA: Diagnosis not present

## 2017-06-22 DIAGNOSIS — F17200 Nicotine dependence, unspecified, uncomplicated: Secondary | ICD-10-CM | POA: Diagnosis not present

## 2017-06-22 DIAGNOSIS — Z902 Acquired absence of lung [part of]: Secondary | ICD-10-CM | POA: Diagnosis not present

## 2017-06-22 DIAGNOSIS — T8141XA Infection following a procedure, superficial incisional surgical site, initial encounter: Secondary | ICD-10-CM | POA: Diagnosis not present

## 2017-06-22 DIAGNOSIS — E1169 Type 2 diabetes mellitus with other specified complication: Secondary | ICD-10-CM | POA: Diagnosis present

## 2017-06-22 DIAGNOSIS — Z8701 Personal history of pneumonia (recurrent): Secondary | ICD-10-CM | POA: Diagnosis not present

## 2017-06-22 DIAGNOSIS — T8130XA Disruption of wound, unspecified, initial encounter: Secondary | ICD-10-CM | POA: Diagnosis not present

## 2017-06-22 DIAGNOSIS — L089 Local infection of the skin and subcutaneous tissue, unspecified: Secondary | ICD-10-CM | POA: Diagnosis not present

## 2017-06-22 DIAGNOSIS — N179 Acute kidney failure, unspecified: Secondary | ICD-10-CM | POA: Diagnosis not present

## 2017-06-22 DIAGNOSIS — Z833 Family history of diabetes mellitus: Secondary | ICD-10-CM

## 2017-06-22 DIAGNOSIS — E11628 Type 2 diabetes mellitus with other skin complications: Secondary | ICD-10-CM | POA: Diagnosis present

## 2017-06-22 DIAGNOSIS — Z96641 Presence of right artificial hip joint: Secondary | ICD-10-CM | POA: Diagnosis present

## 2017-06-22 DIAGNOSIS — K219 Gastro-esophageal reflux disease without esophagitis: Secondary | ICD-10-CM | POA: Diagnosis present

## 2017-06-22 DIAGNOSIS — E11621 Type 2 diabetes mellitus with foot ulcer: Secondary | ICD-10-CM | POA: Diagnosis present

## 2017-06-22 DIAGNOSIS — Z89421 Acquired absence of other right toe(s): Secondary | ICD-10-CM

## 2017-06-22 DIAGNOSIS — Z720 Tobacco use: Secondary | ICD-10-CM

## 2017-06-22 DIAGNOSIS — Z888 Allergy status to other drugs, medicaments and biological substances status: Secondary | ICD-10-CM | POA: Diagnosis not present

## 2017-06-22 DIAGNOSIS — T8140XA Infection following a procedure, unspecified, initial encounter: Secondary | ICD-10-CM | POA: Diagnosis present

## 2017-06-22 DIAGNOSIS — Z96653 Presence of artificial knee joint, bilateral: Secondary | ICD-10-CM | POA: Diagnosis present

## 2017-06-22 LAB — CBC WITH DIFFERENTIAL/PLATELET
Basophils Absolute: 0.1 10*3/uL (ref 0.0–0.1)
Basophils Relative: 1 %
EOS ABS: 0.4 10*3/uL (ref 0.0–0.7)
EOS PCT: 5 %
HCT: 40.6 % (ref 39.0–52.0)
Hemoglobin: 13.2 g/dL (ref 13.0–17.0)
LYMPHS PCT: 30 %
Lymphs Abs: 2.5 10*3/uL (ref 0.7–4.0)
MCH: 27.4 pg (ref 26.0–34.0)
MCHC: 32.5 g/dL (ref 30.0–36.0)
MCV: 84.4 fL (ref 78.0–100.0)
MONO ABS: 0.4 10*3/uL (ref 0.1–1.0)
Monocytes Relative: 5 %
Neutro Abs: 5.1 10*3/uL (ref 1.7–7.7)
Neutrophils Relative %: 59 %
PLATELETS: 280 10*3/uL (ref 150–400)
RBC: 4.81 MIL/uL (ref 4.22–5.81)
RDW: 15.7 % — AB (ref 11.5–15.5)
WBC: 8.5 10*3/uL (ref 4.0–10.5)

## 2017-06-22 LAB — BASIC METABOLIC PANEL
Anion gap: 12 (ref 5–15)
BUN: 12 mg/dL (ref 6–20)
CO2: 24 mmol/L (ref 22–32)
CREATININE: 0.72 mg/dL (ref 0.61–1.24)
Calcium: 9.7 mg/dL (ref 8.9–10.3)
Chloride: 102 mmol/L (ref 101–111)
GFR calc Af Amer: >60 mL/min (ref >60)
GLUCOSE: 172 mg/dL — AB (ref 65–99)
Potassium: 4 mmol/L (ref 3.5–5.1)
Sodium: 138 mmol/L (ref 135–145)

## 2017-06-22 LAB — GLUCOSE, CAPILLARY: Glucose-Capillary: 188 mg/dL — ABNORMAL HIGH (ref 65–99)

## 2017-06-22 LAB — CBG MONITORING, ED
Glucose-Capillary: 209 mg/dL — ABNORMAL HIGH (ref 65–99)
Glucose-Capillary: 203 mg/dL — ABNORMAL HIGH (ref 65–99)

## 2017-06-22 MED ORDER — FENTANYL CITRATE (PF) 100 MCG/2ML IJ SOLN
50.0000 ug | Freq: Once | INTRAMUSCULAR | Status: AC
  Administered 2017-06-22: 50 ug via INTRAVENOUS
  Filled 2017-06-22: qty 2

## 2017-06-22 MED ORDER — SODIUM CHLORIDE 0.9 % IV SOLN
250.0000 mL | INTRAVENOUS | Status: DC | PRN
  Administered 2017-06-22: 250 mL via INTRAVENOUS

## 2017-06-22 MED ORDER — VANCOMYCIN HCL 10 G IV SOLR
1500.0000 mg | Freq: Two times a day (BID) | INTRAVENOUS | Status: DC
  Administered 2017-06-22 – 2017-06-24 (×5): 1500 mg via INTRAVENOUS
  Filled 2017-06-22 (×7): qty 1500
  Filled 2017-06-22: qty 1000

## 2017-06-22 MED ORDER — SODIUM CHLORIDE 0.9 % IV SOLN
INTRAVENOUS | Status: DC
Start: 2017-06-22 — End: 2017-06-22
  Administered 2017-06-22: 15:00:00 via INTRAVENOUS

## 2017-06-22 MED ORDER — LISINOPRIL 20 MG PO TABS
20.0000 mg | ORAL_TABLET | Freq: Every day | ORAL | Status: DC
  Administered 2017-06-22 – 2017-06-25 (×3): 20 mg via ORAL
  Filled 2017-06-22: qty 1
  Filled 2017-06-22: qty 2
  Filled 2017-06-22 (×2): qty 1

## 2017-06-22 MED ORDER — NICOTINE 21 MG/24HR TD PT24
21.0000 mg | MEDICATED_PATCH | Freq: Once | TRANSDERMAL | Status: AC
  Administered 2017-06-22: 21 mg via TRANSDERMAL
  Filled 2017-06-22 (×2): qty 1

## 2017-06-22 MED ORDER — PIPERACILLIN-TAZOBACTAM 3.375 G IVPB 30 MIN
3.3750 g | Freq: Once | INTRAVENOUS | Status: AC
  Administered 2017-06-22: 3.375 g via INTRAVENOUS
  Filled 2017-06-22: qty 50

## 2017-06-22 MED ORDER — ROSUVASTATIN CALCIUM 20 MG PO TABS
20.0000 mg | ORAL_TABLET | Freq: Every day | ORAL | Status: DC
  Administered 2017-06-22 – 2017-06-27 (×6): 20 mg via ORAL
  Filled 2017-06-22 (×7): qty 1

## 2017-06-22 MED ORDER — PIPERACILLIN-TAZOBACTAM 3.375 G IVPB
3.3750 g | Freq: Three times a day (TID) | INTRAVENOUS | Status: DC
  Administered 2017-06-22 – 2017-06-25 (×8): 3.375 g via INTRAVENOUS
  Filled 2017-06-22 (×9): qty 50

## 2017-06-22 MED ORDER — QUETIAPINE FUMARATE 100 MG PO TABS
100.0000 mg | ORAL_TABLET | Freq: Every day | ORAL | Status: DC
  Administered 2017-06-22 – 2017-06-28 (×6): 100 mg via ORAL
  Filled 2017-06-22 (×4): qty 1
  Filled 2017-06-22: qty 2
  Filled 2017-06-22: qty 1
  Filled 2017-06-22: qty 2

## 2017-06-22 MED ORDER — OXYCODONE HCL 5 MG PO TABS
5.0000 mg | ORAL_TABLET | Freq: Four times a day (QID) | ORAL | Status: DC | PRN
  Administered 2017-06-22: 5 mg via ORAL
  Filled 2017-06-22: qty 1

## 2017-06-22 MED ORDER — DILTIAZEM HCL 60 MG PO TABS
30.0000 mg | ORAL_TABLET | Freq: Two times a day (BID) | ORAL | Status: DC
  Administered 2017-06-22 – 2017-06-28 (×11): 30 mg via ORAL
  Filled 2017-06-22 (×12): qty 1

## 2017-06-22 MED ORDER — INSULIN ASPART 100 UNIT/ML ~~LOC~~ SOLN
0.0000 [IU] | Freq: Three times a day (TID) | SUBCUTANEOUS | Status: DC
  Administered 2017-06-22: 3 [IU] via SUBCUTANEOUS
  Administered 2017-06-23: 7 [IU] via SUBCUTANEOUS
  Administered 2017-06-23: 1 [IU] via SUBCUTANEOUS
  Administered 2017-06-24: 2 [IU] via SUBCUTANEOUS
  Administered 2017-06-24 – 2017-06-25 (×3): 3 [IU] via SUBCUTANEOUS
  Administered 2017-06-25: 1 [IU] via SUBCUTANEOUS
  Administered 2017-06-25: 2 [IU] via SUBCUTANEOUS
  Administered 2017-06-26: 3 [IU] via SUBCUTANEOUS
  Administered 2017-06-26: 2 [IU] via SUBCUTANEOUS
  Administered 2017-06-26 – 2017-06-27 (×2): 3 [IU] via SUBCUTANEOUS
  Administered 2017-06-27: 5 [IU] via SUBCUTANEOUS
  Administered 2017-06-28: 3 [IU] via SUBCUTANEOUS
  Filled 2017-06-22: qty 1

## 2017-06-22 MED ORDER — SODIUM CHLORIDE 0.9% FLUSH
3.0000 mL | Freq: Two times a day (BID) | INTRAVENOUS | Status: DC
  Administered 2017-06-22 – 2017-06-25 (×2): 3 mL via INTRAVENOUS

## 2017-06-22 MED ORDER — OXYCODONE HCL 5 MG PO TABS
10.0000 mg | ORAL_TABLET | ORAL | Status: DC | PRN
Start: 2017-06-22 — End: 2017-06-28
  Administered 2017-06-22 – 2017-06-28 (×28): 10 mg via ORAL
  Filled 2017-06-22 (×29): qty 2

## 2017-06-22 MED ORDER — VANCOMYCIN HCL IN DEXTROSE 1-5 GM/200ML-% IV SOLN
1000.0000 mg | Freq: Once | INTRAVENOUS | Status: AC
  Administered 2017-06-22: 1000 mg via INTRAVENOUS
  Filled 2017-06-22: qty 200

## 2017-06-22 MED ORDER — SODIUM CHLORIDE 0.9% FLUSH: 3.0000 mL | INTRAVENOUS | Status: DC | PRN

## 2017-06-22 NOTE — Telephone Encounter (Signed)
Karrie Meres gentry with [facility] called stating that patient's right foot is red, a little swollen, and the tissue does not look healthy.  Patient is having clear drainage and the right foot looks different from last week.  Talked with Dr. Jess Barters assistant [place] and she advised for patient to be non WB on right foot and to use water and dial soap and patient needs to  continue antibiotics that were prescribed by Dr. Sharol Given.  Cb# is [phone].  Patient has appt.on 06/24/17.

## 2017-06-22 NOTE — ED Triage Notes (Signed)
Pt states had 2nd digit on right foot amputated two weeks ago. Pt states has infection to area. Surgery was performed by Dr. Sharol Given.

## 2017-06-22 NOTE — H&P (Signed)
History and Physical    [name] [id number] DOB: 1965/10/28 DOA: 06/22/2017  PCP: Lucia Gaskins, MD  Patient coming from:  home  Chief Complaint: post op Foot infection  HPI: [name] is a 52 y.o. male with medical history significant of diabetes, polysubstance abuse, anxiety, alcohol abuse status post right second toe amputation secondary to osteomyelitis and failing conservative measures by Dr.Duda on Jun 09, 2017 at [facility] who had follow-up with orthopedic surgery office on 06/16/2017 at which point incidentally noted per note that his wound looked good at that time and not infected.  Since that time patient has been placed on doxycycline due to purulent discharge from the area along with erythema.  Patient does not recall how long he has been on the doxycycline but at least for several days.  Patient's foot continue to get more red and swollen with purulent discharge from the area and dehiscence of the wound.  Patient denies any fevers.  Patient referred for admission for postop wound infection.  Dr. Sharol Given at [facility] has been called and made aware and advised patient can stay here at [facility] however we have no orthopedic surgery coverage here for several days.  Review of Systems: As per HPI otherwise 10 point review of systems negative.   Past Medical History:  Diagnosis Date  . Anxiety   . Arthritis    "everywhere" (04/21/2012)  . Cellulitis and abscess of foot 12/19/2013   LEFT FOOT  . Chronic pain   . DDD (degenerative disc disease)   . Depression   . Diabetes mellitus without complication (HCC)    borderline  . Diabetic foot ulcer ([place])   . ETOH abuse   . GERD (gastroesophageal reflux disease)    tums  . History of blood transfusion    "related to left knee OR; probably right hip too" (04/21/2012)  . Mental disorder   . Neuromuscular disorder (HCC)    neuropathy  . Noncompliance   . Open wound    bottom of foot  . Pneumonia ~ 2012  . Polysubstance abuse  (HCC)    etoh, cocaine, heroin  . Stroke [facility]) 2008   "they said I might have had one during right hip replacement" (04/21/2012)    Past Surgical History:  Procedure Laterality Date  . AMPUTATION Right 06/09/2017   Procedure: RIGHT 2ND TOE AMPUTATION;  Surgeon: [name], MD;  Location: [place];  Service: Orthopedics;  Laterality: Right;  . JOINT REPLACEMENT    . KNEE ARTHROSCOPY Bilateral 1980's/1990's  . LUNG LOBECTOMY Left ~ 2006  . LUNG LOBECTOMY    . METATARSAL OSTEOTOMY  10/29/2011   Procedure: METATARSAL OSTEOTOMY;  Surgeon: [name], MD;  Location: [place];  Service: Orthopedics;  Laterality: Left;  Left 1st Metatarsal Dorsal Closing Wedge   . METATARSAL OSTEOTOMY Right 04/28/2017   Procedure: RIGHT 1ST METATARSAL DORSAL CLOSING WEDGE OSTEOTOMY;  Surgeon: [name], MD;  Location: [place];  Service: Orthopedics;  Laterality: Right;  . REVISION TOTAL HIP ARTHROPLASTY Right 2008   "4-5 months after replacement" (04/21/2012)  . TOTAL HIP ARTHROPLASTY Right 2008  . TOTAL KNEE ARTHROPLASTY Left 2006  . TOTAL KNEE ARTHROPLASTY Right 04/20/2012  . TOTAL KNEE ARTHROPLASTY Right 04/20/2012   Procedure: TOTAL KNEE ARTHROPLASTY;  Surgeon: [name], MD;  Location: [place];  Service: Orthopedics;  Laterality: Right;  Right Total Knee Arthroplasty     reports that he has been smoking cigarettes.  He has a 15.00  pack-year smoking history. He has never used smokeless tobacco. He reports that he has current or past drug history. Drug: Cocaine. He reports that he does not drink alcohol.  Allergies  Allergen Reactions  . Benadryl [Diphenhydramine Hcl] Other (See Comments)    Leg spasms   . Trazodone And Nefazodone Other (See Comments)    Leg spasms     Family History  Problem Relation Age of Onset  . Diabetes Mother   . Diabetes Father     Prior to Admission medications   Medication Sig Start Date End Date Taking? Authorizing Provider  acetaminophen (TYLENOL) 325 MG tablet Take 2  tablets (650 mg total) by mouth every 6 (six) hours as needed for mild pain (or Fever >/= 101). 06/10/17   Georgette Shell, MD  blood glucose meter kit and supplies KIT Dispense based on patient and insurance preference. Use up to four times daily as directed. (FOR ICD-9 250.00, 250.01). 08/18/16   Orvan Falconer, MD  Cholecalciferol (VITAMIN D3) 5000 units CAPS Take 10,000 Units by mouth daily.    [provider]  dapagliflozin propanediol (FARXIGA) 10 MG TABS tablet Take 10 mg by mouth daily.    [provider]  diltiazem (CARDIZEM) 30 MG tablet Take 1 tablet (30 mg total) by mouth 2 (two) times daily. 09/10/16   Lendon Colonel, NP  lisinopril (PRINIVIL,ZESTRIL) 20 MG tablet Take 20 mg by mouth daily.    [provider]  metFORMIN (GLUCOPHAGE) 500 MG tablet Take 1 tablet (500 mg total) by mouth 2 (two) times daily with a meal. Patient taking differently: Take 1,000 mg by mouth 2 (two) times daily with a meal.  01/15/14   Philemon Kingdom, MD  oxyCODONE (OXY IR/ROXICODONE) 5 MG immediate release tablet Take 1 tablet (5 mg total) by mouth every 6 (six) hours as needed for up to 7 days for moderate pain. 06/16/17 06/23/17  Suzan Slick, NP  QUEtiapine (SEROQUEL) 100 MG tablet Take 100 mg by mouth at bedtime.    [provider]  rosuvastatin (CRESTOR) 20 MG tablet Take 20 mg by mouth daily. 01/20/17   [provider]    Physical Exam: Vitals:   06/22/17 1347 06/22/17 1349 06/22/17 1604  BP: (!) 142/98  (!) 124/91  Pulse: (!) 108  85  Resp: 15  16  Temp: 97.7 F (36.5 C)    TempSrc: Oral    SpO2: 100%  100%  Weight:  81.6 kg (180 lb)   Height:  '6\' 3"'$  (1.905 m)       Constitutional: NAD, calm, comfortable Vitals:   06/22/17 1347 06/22/17 1349 06/22/17 1604  BP: (!) 142/98  (!) 124/91  Pulse: (!) 108  85  Resp: 15  16  Temp: 97.7 F (36.5 C)    TempSrc: Oral    SpO2: 100%  100%  Weight:  81.6 kg (180 lb)   Height:  '6\' 3"'$  (1.905 m)     Eyes: PERRL, lids and conjunctivae normal ENMT: Mucous membranes are moist. Posterior pharynx clear of any exudate or lesions.Normal dentition.  Neck: normal, supple, no masses, no thyromegaly Respiratory: clear to auscultation bilaterally, no wheezing, no crackles. Normal respiratory effort. No accessory muscle use.  Cardiovascular: Regular rate and rhythm, no murmurs / rubs / gallops. No extremity edema. 2+ pedal pulses. No carotid bruits.  Abdomen: no tenderness, no masses palpated. No hepatosplenomegaly. Bowel sounds positive.  Musculoskeletal: no clubbing / cyanosis. No joint deformity upper and lower extremities. Good ROM,  no contractures. Normal muscle tone.  Skin: Right second toe with small dehiscence of wound still with sutures in place with some purulent discharge and surrounding erythema up to the mid forefoot with mild swelling consistent with cellulitis and postop infection no induration or fluctuance noted  neurologic: CN 2-12 grossly intact. Sensation intact, DTR normal. Strength 5/5 in all 4.  Psychiatric: Normal judgment and insight. Alert and oriented x 3. Normal mood.    Labs on Admission: I have personally reviewed following labs and imaging studies  CBC: Recent Labs  Lab 06/22/17 1457  WBC 8.5  NEUTROABS 5.1  HGB 13.2  HCT 40.6  MCV 84.4  PLT 295   Basic Metabolic Panel: Recent Labs  Lab 06/22/17 1457  NA 138  K 4.0  CL 102  CO2 24  GLUCOSE 172*  BUN 12  CREATININE 0.72  CALCIUM 9.7   GFR: Estimated Creatinine Clearance: 121.8 mL/min (by C-G formula based on SCr of 0.72 mg/dL). Liver Function Tests: No results for input(s): AST, ALT, ALKPHOS, BILITOT, PROT, ALBUMIN in the last 168 hours. No results for input(s): LIPASE, AMYLASE in the last 168 hours. No results for input(s): AMMONIA in the last 168 hours. Coagulation Profile: No results for input(s): INR, PROTIME in the last 168 hours. Cardiac Enzymes: No results for input(s): CKTOTAL, CKMB,  CKMBINDEX, TROPONINI in the last 168 hours. BNP (last 3 results) No results for input(s): PROBNP in the last 8760 hours. HbA1C: No results for input(s): HGBA1C in the last 72 hours. CBG: Recent Labs  Lab 06/22/17 1425  GLUCAP 209*   Lipid Profile: No results for input(s): CHOL, HDL, LDLCALC, TRIG, CHOLHDL, LDLDIRECT in the last 72 hours. Thyroid Function Tests: No results for input(s): TSH, T4TOTAL, FREET4, T3FREE, THYROIDAB in the last 72 hours. Anemia Panel: No results for input(s): VITAMINB12, FOLATE, FERRITIN, TIBC, IRON, RETICCTPCT in the last 72 hours. Urine analysis:    Component Value Date/Time   COLORURINE STRAW (A) 09/04/2016 0741   APPEARANCEUR CLEAR 09/04/2016 0741   LABSPEC 1.001 (L) 09/04/2016 0741   PHURINE 6.0 09/04/2016 0741   GLUCOSEU >=500 (A) 09/04/2016 0741   HGBUR NEGATIVE 09/04/2016 0741   BILIRUBINUR NEGATIVE 09/04/2016 0741   KETONESUR 5 (A) 09/04/2016 0741   PROTEINUR NEGATIVE 09/04/2016 0741   UROBILINOGEN 0.2 12/18/2013 1402   NITRITE NEGATIVE 09/04/2016 0741   LEUKOCYTESUR NEGATIVE 09/04/2016 0741   Sepsis Labs: !!!!!!!!!!!!!!!!!!!!!!!!!!!!!!!!!!!!!!!!!!!! '@LABRCNTIP'$ (procalcitonin:4,lacticidven:4) )No results found for this or any previous visit (from the past 240 hour(s)).   Radiological Exams on Admission: Dg Foot Complete Right  Result Date: 06/22/2017 CLINICAL DATA:  Swelling and pain post amputation EXAM: RIGHT FOOT COMPLETE - 3+ VIEW COMPARISON:  06/04/2017 FINDINGS: Status post amputation of the 2nd digit at the MTP joint. Fracture of the 3rd metacarpal head/neck, mildly displaced, new. Marginal erosion at the medial base of the 1st proximal phalanx, unchanged. Postsurgical changes involving the base of the 1st metatarsal. Degenerative changes at the 1st tarsometatarsal joint. Maturing bone formation at the 1st metatarsal osteotomy site. Mild soft tissue swelling in the forefoot, centered at the surgical bed. IMPRESSION: Status post  amputation of the 2nd digit at the MTP joint. Fracture of the 3rd metacarpal head/neck, mildly displace, new from the prior. Mild soft tissue swelling in the forefoot. Additional stable degenerative and postsurgical changes, as described above. Electronically Signed   By: Julian Hy M.D.   On: 06/22/2017 15:52    Old chart reviewed Case discussed with Dr. Sabra Heck in the ED Case discussed  with Dr. Rosanna Randy with [facility] at [facility]  Assessment/Plan 52 year old diabetic male status post recent right second toe amputation at [facility] comes in with postop infection Principal Problem:   Post op infection-failing outpatient doxycycline orally.  Place patient on IV vancomycin and Zosyn.  Vitals are stable.  Transfer to [facility] so patient can be evaluated by his surgeon for the need for any further surgical intervention.  Elevate foot.  Obtain MRI of the right foot.  Per chart review appears patient has been noncompliant with his postop instructions from Dr. Sharol Given.  Active Problems:   Diabetic foot infection (HCC)-placed on sliding scale insulin   Substance abuse (HCC)-check urine drug screen   Cocaine abuse (HCC)-check urine drug screen   Generalized anxiety disorder-noted   Tobacco use disorder nicotine patch.-    DVT prophylaxis: SCDs Code Status: Full Family Communication: None Disposition Plan: Per day team Consults called: Dr. Sharol Given at [facility] Admission status: Observation   Emelina Hinch A MD [facility]  If 7PM-7AM, please contact night-coverage [email or web address] Password [email or web address]  06/22/2017, 4:08 PM

## 2017-06-22 NOTE — Telephone Encounter (Signed)
Triage had advised that if the pt has s/s that are worsening that he should proceed to the hospital ( Dr. Sharol Given Will be in the OR all day tomorrow) or can keep his appt on Thursday. Tried to call the pt back x 1 to see if he wanted to come in ( had cancellation) no answer.

## 2017-06-22 NOTE — Progress Notes (Signed)
Pharmacy Antibiotic Note  [name] is a 52 y.o. male admitted on 06/22/2017 with wound infection.  Pharmacy has been consulted for Zosyn and Vancomycin  dosing.  Plan: Vancomycin 1500mg  IV every 12 hours.  Goal trough 10-15 mcg/mL. Zosyn 3.375g IV q8h (4 hour infusion).  F/u cxs and clinical progress Monitor V/S, labs and levels as indicated  Height: 6\' 3"  (190.5 cm) Weight: 180 lb (81.6 kg) IBW/kg (Calculated) : 84.5  Temp (24hrs), Avg:97.7 F (36.5 C), Min:97.7 F (36.5 C), Max:97.7 F (36.5 C)  Recent Labs  Lab 06/22/17 1457  WBC 8.5  CREATININE 0.72    Estimated Creatinine Clearance: 121.8 mL/min (by C-G formula based on SCr of 0.72 mg/dL).    Allergies  Allergen Reactions  . Benadryl [Diphenhydramine Hcl] Other (See Comments)    Leg spasms   . Trazodone And Nefazodone Other (See Comments)    Leg spasms     Antimicrobials this admission: Vancomycin 6/4 >>  Zosyn 6/4 >>   Dose adjustments this admission: n/a  Microbiology results: 5/18 MRSA PCR: negative  Thank you for allowing pharmacy to be a part of this patient's care.  Isac Sarna, BS [place], [place] Clinical Pharmacist Pager [phone] 06/22/2017 4:30 PM

## 2017-06-22 NOTE — ED Notes (Signed)
Report given to [facility] with [facility] at this time. ETA 30 min.

## 2017-06-22 NOTE — ED Provider Notes (Signed)
[facility] EMERGENCY DEPARTMENT Provider Note   CSN: [id number] Arrival date & time: 06/22/17  1344     History   Chief Complaint Chief Complaint  Patient presents with  . Foot Pain    HPI [name] is a 52 y.o. male.  HPI  52 year old male, he is a known diabetic, he has recently undergone an amputation of his right second toe, he has a history of diabetic foot ulcer on the left as well as on the right, he is being treated by Dr. Sharol Given in [place].  The patient reports that over the course of the last several days he has had some worsening redness swelling purulent drainage and a foul smell coming from his right foot.  It is increasingly more tender.  He denies having fevers vomiting or lightheadedness.  He has been taking doxycycline since surgery, he has not missed any doses but states this redness and drainage and pain and swelling has occurred despite taking his medications as prescribed.  He states that his therapist came to the house today to work with him and when they noticed the drainage they called the surgeon's office, he reports that the surgeon recommended to come to the hospital if he felt like the wound was getting worse.    Past Medical History:  Diagnosis Date  . Anxiety   . Arthritis    "everywhere" (04/21/2012)  . Cellulitis and abscess of foot 12/19/2013   LEFT FOOT  . Chronic pain   . DDD (degenerative disc disease)   . Depression   . Diabetes mellitus without complication (HCC)    borderline  . Diabetic foot ulcer ([place])   . ETOH abuse   . GERD (gastroesophageal reflux disease)    tums  . History of blood transfusion    "related to left knee OR; probably right hip too" (04/21/2012)  . Mental disorder   . Neuromuscular disorder (HCC)    neuropathy  . Noncompliance   . Open wound    bottom of foot  . Pneumonia ~ 2012  . Polysubstance abuse (HCC)    etoh, cocaine, heroin  . Stroke [facility]) 2008   "they said I might have had one during right hip  replacement" (04/21/2012)    Patient Active Problem List   Diagnosis Date Noted  . Chronic osteomyelitis of toe, right ([place])   . Toe osteomyelitis, right ([place]) 06/05/2017  . Right second toe ulcer ([place]) 06/05/2017  . Chronic ulcer of right foot limited to breakdown of skin ([place])   . SVT (supraventricular tachycardia) ([place]) 08/17/2016  . Tobacco use disorder 10/25/2014  . Diabetes mellitus type 2 in nonobese (HCC)   . Swelling   . Type 2 diabetes mellitus with left diabetic foot ulcer ([place]) 12/18/2013  . Diabetes mellitus due to underlying condition with foot ulcer ([place]) 12/18/2013  . Left leg cellulitis 12/18/2013  . Cellulitis of right lower extremity 07/19/2013  . Cocaine abuse ([place]) 03/29/2012  . Generalized anxiety disorder 03/29/2012  . Panic attacks 03/29/2012  . Alcohol dependence ([place]) 08/05/2011    Class: Chronic  . Substance abuse ([place]) 05/31/2011  . Foot ulcer, left ([place]) 02/15/2011  . Neuropathic ulcer of foot ([place]) 02/15/2011    Past Surgical History:  Procedure Laterality Date  . AMPUTATION Right 06/09/2017   Procedure: RIGHT 2ND TOE AMPUTATION;  Surgeon: [name], MD;  Location: [place];  Service: Orthopedics;  Laterality: Right;  . JOINT REPLACEMENT    . KNEE ARTHROSCOPY Bilateral 1980's/1990's  .  LUNG LOBECTOMY Left ~ 2006  . LUNG LOBECTOMY    . METATARSAL OSTEOTOMY  10/29/2011   Procedure: METATARSAL OSTEOTOMY;  Surgeon: [name], MD;  Location: [place];  Service: Orthopedics;  Laterality: Left;  Left 1st Metatarsal Dorsal Closing Wedge   . METATARSAL OSTEOTOMY Right 04/28/2017   Procedure: RIGHT 1ST METATARSAL DORSAL CLOSING WEDGE OSTEOTOMY;  Surgeon: [name], MD;  Location: [place];  Service: Orthopedics;  Laterality: Right;  . REVISION TOTAL HIP ARTHROPLASTY Right 2008   "4-5 months after replacement" (04/21/2012)  . TOTAL HIP ARTHROPLASTY Right 2008  . TOTAL KNEE ARTHROPLASTY Left 2006  . TOTAL KNEE ARTHROPLASTY Right 04/20/2012  . TOTAL KNEE  ARTHROPLASTY Right 04/20/2012   Procedure: TOTAL KNEE ARTHROPLASTY;  Surgeon: [name], MD;  Location: [place];  Service: Orthopedics;  Laterality: Right;  Right Total Knee Arthroplasty        Home Medications    Prior to Admission medications   Medication Sig Start Date End Date Taking? Authorizing Provider  acetaminophen (TYLENOL) 325 MG tablet Take 2 tablets (650 mg total) by mouth every 6 (six) hours as needed for mild pain (or Fever >/= 101). 06/10/17   Georgette Shell, MD  blood glucose meter kit and supplies KIT Dispense based on patient and insurance preference. Use up to four times daily as directed. (FOR ICD-9 250.00, 250.01). 08/18/16   Orvan Falconer, MD  Cholecalciferol (VITAMIN D3) 5000 units CAPS Take 10,000 Units by mouth daily.    [provider]  dapagliflozin propanediol (FARXIGA) 10 MG TABS tablet Take 10 mg by mouth daily.    [provider]  diltiazem (CARDIZEM) 30 MG tablet Take 1 tablet (30 mg total) by mouth 2 (two) times daily. 09/10/16   Lendon Colonel, NP  lisinopril (PRINIVIL,ZESTRIL) 20 MG tablet Take 20 mg by mouth daily.    [provider]  metFORMIN (GLUCOPHAGE) 500 MG tablet Take 1 tablet (500 mg total) by mouth 2 (two) times daily with a meal. Patient taking differently: Take 1,000 mg by mouth 2 (two) times daily with a meal.  01/15/14   Philemon Kingdom, MD  oxyCODONE (OXY IR/ROXICODONE) 5 MG immediate release tablet Take 1 tablet (5 mg total) by mouth every 6 (six) hours as needed for up to 7 days for moderate pain. 06/16/17 06/23/17  Suzan Slick, NP  QUEtiapine (SEROQUEL) 100 MG tablet Take 100 mg by mouth at bedtime.    [provider]  rosuvastatin (CRESTOR) 20 MG tablet Take 20 mg by mouth daily. 01/20/17   [provider]    Family History Family History  Problem Relation Age of Onset  . Diabetes Mother   . Diabetes Father     Social History Social History   Tobacco Use  . Smoking status:  Current Every Day Smoker    Packs/day: 0.50    Years: 30.00    Pack years: 15.00    Types: Cigarettes  . Smokeless tobacco: Never Used  Substance Use Topics  . Alcohol use: No    Alcohol/week: 0.0 oz    Frequency: Never  . Drug use: Yes    Types: Cocaine    Comment: Cocaine and heroin      Allergies   Benadryl [diphenhydramine hcl] and Trazodone and nefazodone   Review of Systems Review of Systems  Constitutional: Negative for fever.  Respiratory: Negative for cough and shortness of breath.   Cardiovascular: Positive for leg swelling ( foot). Negative for chest pain.  Gastrointestinal: Negative for nausea and vomiting.  Musculoskeletal: Positive for gait problem and joint swelling.  Skin: Positive for wound.  Neurological: Negative for numbness.  All other systems reviewed and are negative.    Physical Exam Updated Vital Signs BP (!) 142/98   Pulse (!) 108   Temp 97.7 F (36.5 C) (Oral)   Resp 15   Ht 6' 3"  (1.905 m)   Wt 81.6 kg (180 lb)   SpO2 100%   BMI 22.50 kg/m   Physical Exam  Constitutional: He appears well-developed and well-nourished. No distress.  HENT:  Head: Normocephalic and atraumatic.  Mouth/Throat: Oropharynx is clear and moist. No oropharyngeal exudate.  Eyes: Pupils are equal, round, and reactive to light. Conjunctivae and EOM are normal. Right eye exhibits no discharge. Left eye exhibits no discharge. No scleral icterus.  Neck: Normal range of motion. Neck supple. No JVD present. No thyromegaly present.  Cardiovascular: Normal rate, regular rhythm, normal heart sounds and intact distal pulses. Exam reveals no gallop and no friction rub.  No murmur heard. Pulmonary/Chest: Effort normal and breath sounds normal. No respiratory distress. He has no wheezes. He has no rales.  Abdominal: Soft. Bowel sounds are normal. He exhibits no distension and no mass. There is no tenderness.  Musculoskeletal: Normal range of motion. He exhibits tenderness  and deformity. He exhibits no edema.  The plantar aspect of the distal left foot there is a ulcer with a small amount of drainage but no surrounding redness or swelling.  On the right foot in the wound bed of the second digit which is now surgically absent there is a slight amount of dehiscence of the wound with significant purulence, foul smell and extending redness onto the dorsum of the foot with associated swelling.  Lymphadenopathy:    He has no cervical adenopathy.  Neurological: He is alert. Coordination normal.  Skin: Skin is warm and dry. No rash noted. No erythema.  Psychiatric: He has a normal mood and affect. His behavior is normal.  Nursing note and vitals reviewed.    ED Treatments / Results  Labs (all labs ordered are listed, but only abnormal results are displayed) Labs Reviewed  CBC WITH DIFFERENTIAL/PLATELET - Abnormal; Notable for the following components:      Result Value   RDW 15.7 (*)    All other components within normal limits  BASIC METABOLIC PANEL - Abnormal; Notable for the following components:   Glucose, Bld 172 (*)    All other components within normal limits  CBG MONITORING, ED - Abnormal; Notable for the following components:   Glucose-Capillary 209 (*)    All other components within normal limits  AEROBIC CULTURE (SUPERFICIAL SPECIMEN)    EKG None  Radiology No results found.  Procedures Procedures (including critical care time)  Medications Ordered in ED Medications  0.9 %  sodium chloride infusion ( Intravenous New Bag/Given 06/22/17 1441)  vancomycin (VANCOCIN) IVPB 1000 mg/200 mL premix (1,000 mg Intravenous New Bag/Given 06/22/17 1523)  piperacillin-tazobactam (ZOSYN) IVPB 3.375 g (3.375 g Intravenous New Bag/Given 06/22/17 1441)  fentaNYL (SUBLIMAZE) injection 50 mcg (50 mcg Intravenous Given 06/22/17 1523)     Initial Impression / Assessment and Plan / ED Course  I have reviewed the triage vital signs and the nursing notes.  Pertinent  labs & imaging results that were available during my care of the patient were reviewed by me and considered in my medical decision making (see chart for details).  Clinical Course as of Jun 04  [place] Jun 22, 2017  1444 D/w Dr. Sharol Given - requests local admission - vancomycin and zosyn.   [BM]    Clinical Course User Index [BM] Noemi Chapel, MD   The patient appears to have a wound infection postoperatively.  This despite taking the doxycycline suggesting a resistant infection.  The wound culture has been ordered, blood work, saline lock and a stronger intravenous antibiotic.  I will discuss with orthopedics, the patient may need to be admitted to the hospital to begin therapy to make sure it is working as he is immune compromised and at risk for more severe deep infections.  X-ray as well to rule out osteomyelitis  Discussed with Dr. Shanon Brow, she will admit to the hospital.  Antibiotics given  Final Clinical Impressions(s) / ED Diagnoses   Final diagnoses:  Surgical site infection      Noemi Chapel, MD 06/22/17 1542

## 2017-06-23 ENCOUNTER — Encounter (HOSPITAL_COMMUNITY): Payer: Self-pay | Admitting: General Practice

## 2017-06-23 DIAGNOSIS — Z8673 Personal history of transient ischemic attack (TIA), and cerebral infarction without residual deficits: Secondary | ICD-10-CM | POA: Diagnosis not present

## 2017-06-23 DIAGNOSIS — Z7984 Long term (current) use of oral hypoglycemic drugs: Secondary | ICD-10-CM | POA: Diagnosis not present

## 2017-06-23 DIAGNOSIS — L97529 Non-pressure chronic ulcer of other part of left foot with unspecified severity: Secondary | ICD-10-CM | POA: Diagnosis present

## 2017-06-23 DIAGNOSIS — E11628 Type 2 diabetes mellitus with other skin complications: Secondary | ICD-10-CM

## 2017-06-23 DIAGNOSIS — Z902 Acquired absence of lung [part of]: Secondary | ICD-10-CM | POA: Diagnosis not present

## 2017-06-23 DIAGNOSIS — T8130XA Disruption of wound, unspecified, initial encounter: Secondary | ICD-10-CM | POA: Diagnosis present

## 2017-06-23 DIAGNOSIS — Z833 Family history of diabetes mellitus: Secondary | ICD-10-CM | POA: Diagnosis not present

## 2017-06-23 DIAGNOSIS — Z96653 Presence of artificial knee joint, bilateral: Secondary | ICD-10-CM | POA: Diagnosis present

## 2017-06-23 DIAGNOSIS — E11621 Type 2 diabetes mellitus with foot ulcer: Secondary | ICD-10-CM | POA: Diagnosis present

## 2017-06-23 DIAGNOSIS — T8149XA Infection following a procedure, other surgical site, initial encounter: Secondary | ICD-10-CM | POA: Diagnosis present

## 2017-06-23 DIAGNOSIS — F1910 Other psychoactive substance abuse, uncomplicated: Secondary | ICD-10-CM | POA: Diagnosis not present

## 2017-06-23 DIAGNOSIS — Z720 Tobacco use: Secondary | ICD-10-CM | POA: Diagnosis not present

## 2017-06-23 DIAGNOSIS — E1169 Type 2 diabetes mellitus with other specified complication: Secondary | ICD-10-CM | POA: Diagnosis present

## 2017-06-23 DIAGNOSIS — T8141XA Infection following a procedure, superficial incisional surgical site, initial encounter: Secondary | ICD-10-CM | POA: Diagnosis present

## 2017-06-23 DIAGNOSIS — L089 Local infection of the skin and subcutaneous tissue, unspecified: Secondary | ICD-10-CM

## 2017-06-23 DIAGNOSIS — Z9119 Patient's noncompliance with other medical treatment and regimen: Secondary | ICD-10-CM | POA: Diagnosis not present

## 2017-06-23 DIAGNOSIS — F411 Generalized anxiety disorder: Secondary | ICD-10-CM | POA: Diagnosis present

## 2017-06-23 DIAGNOSIS — L03115 Cellulitis of right lower limb: Secondary | ICD-10-CM | POA: Diagnosis present

## 2017-06-23 DIAGNOSIS — Z96641 Presence of right artificial hip joint: Secondary | ICD-10-CM | POA: Diagnosis present

## 2017-06-23 DIAGNOSIS — Z89421 Acquired absence of other right toe(s): Secondary | ICD-10-CM | POA: Diagnosis not present

## 2017-06-23 DIAGNOSIS — Z888 Allergy status to other drugs, medicaments and biological substances status: Secondary | ICD-10-CM | POA: Diagnosis not present

## 2017-06-23 DIAGNOSIS — N179 Acute kidney failure, unspecified: Secondary | ICD-10-CM | POA: Diagnosis present

## 2017-06-23 DIAGNOSIS — Z8701 Personal history of pneumonia (recurrent): Secondary | ICD-10-CM | POA: Diagnosis not present

## 2017-06-23 DIAGNOSIS — F1410 Cocaine abuse, uncomplicated: Secondary | ICD-10-CM | POA: Diagnosis present

## 2017-06-23 DIAGNOSIS — K219 Gastro-esophageal reflux disease without esophagitis: Secondary | ICD-10-CM | POA: Diagnosis present

## 2017-06-23 LAB — RAPID URINE DRUG SCREEN, HOSP PERFORMED
AMPHETAMINES: NOT DETECTED
BENZODIAZEPINES: NOT DETECTED
Barbiturates: NOT DETECTED
COCAINE: POSITIVE — AB
OPIATES: NOT DETECTED
TETRAHYDROCANNABINOL: NOT DETECTED

## 2017-06-23 LAB — GLUCOSE, CAPILLARY
GLUCOSE-CAPILLARY: 322 mg/dL — AB (ref 65–99)
Glucose-Capillary: 135 mg/dL — ABNORMAL HIGH (ref 65–99)
Glucose-Capillary: 110 mg/dL — ABNORMAL HIGH (ref 65–99)
Glucose-Capillary: 126 mg/dL — ABNORMAL HIGH (ref 65–99)

## 2017-06-23 MED ORDER — HYDROXYZINE HCL 10 MG PO TABS
10.0000 mg | ORAL_TABLET | Freq: Four times a day (QID) | ORAL | Status: DC | PRN
  Administered 2017-06-23 – 2017-06-27 (×9): 10 mg via ORAL
  Filled 2017-06-23 (×9): qty 1

## 2017-06-23 MED ORDER — MORPHINE SULFATE (PF) 2 MG/ML IV SOLN
2.0000 mg | INTRAVENOUS | Status: DC | PRN
  Administered 2017-06-23 – 2017-06-28 (×24): 2 mg via INTRAVENOUS
  Filled 2017-06-23 (×24): qty 1

## 2017-06-23 NOTE — Consult Note (Signed)
ORTHOPAEDIC CONSULTATION  REQUESTING PHYSICIAN: Donne Hazel, MD  Chief Complaint: Cellulitis right foot.  HPI: [name] is a 52 y.o. male who presents with cellulitis right foot.  Patient is status post second ray amputation right foot as well as  status post dorsiflexion osteotomy to heal the ulcer beneath the first metatarsal head.   Past Medical History:  Diagnosis Date  . Anxiety   . Arthritis    "everywhere" (04/21/2012)  . Cellulitis and abscess of foot 12/19/2013   LEFT FOOT  . Chronic pain   . DDD (degenerative disc disease)   . Depression   . Diabetes mellitus without complication (HCC)    borderline  . Diabetic foot ulcer ([place])   . ETOH abuse   . GERD (gastroesophageal reflux disease)    tums  . History of blood transfusion    "related to left knee OR; probably right hip too" (04/21/2012)  . Mental disorder   . Neuromuscular disorder (HCC)    neuropathy  . Noncompliance   . Open wound    bottom of foot  . Pneumonia ~ 2012  . Polysubstance abuse (HCC)    etoh, cocaine, heroin  . Stroke [facility]) 2008   "they said I might have had one during right hip replacement" (04/21/2012)   Past Surgical History:  Procedure Laterality Date  . AMPUTATION Right 06/09/2017   Procedure: RIGHT 2ND TOE AMPUTATION;  Surgeon: [name], MD;  Location: [place];  Service: Orthopedics;  Laterality: Right;  . JOINT REPLACEMENT    . KNEE ARTHROSCOPY Bilateral 1980's/1990's  . LUNG LOBECTOMY Left ~ 2006  . LUNG LOBECTOMY    . METATARSAL OSTEOTOMY  10/29/2011   Procedure: METATARSAL OSTEOTOMY;  Surgeon: [name], MD;  Location: [place];  Service: Orthopedics;  Laterality: Left;  Left 1st Metatarsal Dorsal Closing Wedge   . METATARSAL OSTEOTOMY Right 04/28/2017   Procedure: RIGHT 1ST METATARSAL DORSAL CLOSING WEDGE OSTEOTOMY;  Surgeon: [name], MD;  Location: [place];  Service: Orthopedics;  Laterality: Right;  . REVISION TOTAL HIP ARTHROPLASTY Right 2008   "4-5 months after  replacement" (04/21/2012)  . TOTAL HIP ARTHROPLASTY Right 2008  . TOTAL KNEE ARTHROPLASTY Left 2006  . TOTAL KNEE ARTHROPLASTY Right 04/20/2012  . TOTAL KNEE ARTHROPLASTY Right 04/20/2012   Procedure: TOTAL KNEE ARTHROPLASTY;  Surgeon: [name], MD;  Location: [place];  Service: Orthopedics;  Laterality: Right;  Right Total Knee Arthroplasty   Social History   Socioeconomic History  . Marital status: Divorced    Spouse name: Not on file  . Number of children: Not on file  . Years of education: Not on file  . Highest education level: Not on file  Occupational History  . Not on file  Social Needs  . Financial resource strain: Not on file  . Food insecurity:    Worry: Not on file    Inability: Not on file  . Transportation needs:    Medical: Not on file    Non-medical: Not on file  Tobacco Use  . Smoking status: Current Every Day Smoker    Packs/day: 0.50    Years: 30.00    Pack years: 15.00    Types: Cigarettes  . Smokeless tobacco: Never Used  Substance and Sexual Activity  . Alcohol use: No    Alcohol/week: 0.0 oz    Frequency: Never  . Drug use: Yes    Types: Cocaine    Comment: Cocaine and heroin   .  Sexual activity: Yes    Birth control/protection: None  Lifestyle  . Physical activity:    Days per week: Not on file    Minutes per session: Not on file  . Stress: Not on file  Relationships  . Social connections:    Talks on phone: Not on file    Gets together: Not on file    Attends religious service: Not on file    Active member of club or organization: Not on file    Attends meetings of clubs or organizations: Not on file    Relationship status: Not on file  Other Topics Concern  . Not on file  Social History Narrative  . Not on file   Family History  Problem Relation Age of Onset  . Diabetes Mother   . Diabetes Father    - negative except otherwise stated in the family history section Allergies  Allergen Reactions  . Benadryl [Diphenhydramine Hcl]  Other (See Comments)    Leg spasms   . Trazodone And Nefazodone Other (See Comments)    Leg spasms    Prior to Admission medications   Medication Sig Start Date End Date Taking? Authorizing Provider  acetaminophen (TYLENOL) 325 MG tablet Take 2 tablets (650 mg total) by mouth every 6 (six) hours as needed for mild pain (or Fever >/= 101). 06/10/17  Yes Georgette Shell, MD  Cholecalciferol (VITAMIN D3) 5000 units CAPS Take 10,000 Units by mouth daily.   Yes [provider]  dapagliflozin propanediol (FARXIGA) 10 MG TABS tablet Take 10 mg by mouth daily.   Yes [provider]  diltiazem (CARDIZEM) 30 MG tablet Take 1 tablet (30 mg total) by mouth 2 (two) times daily. 09/10/16  Yes Lendon Colonel, NP  doxycycline (VIBRA-TABS) 100 MG tablet Take 100 mg by mouth 2 (two) times daily.   Yes [provider]  lisinopril (PRINIVIL,ZESTRIL) 20 MG tablet Take 20 mg by mouth daily.   Yes [provider]  metFORMIN (GLUCOPHAGE) 500 MG tablet Take 1 tablet (500 mg total) by mouth 2 (two) times daily with a meal. Patient taking differently: Take 1,000 mg by mouth 2 (two) times daily with a meal.  01/15/14  Yes Philemon Kingdom, MD  oxyCODONE (OXY IR/ROXICODONE) 5 MG immediate release tablet Take 1 tablet (5 mg total) by mouth every 6 (six) hours as needed for up to 7 days for moderate pain. 06/16/17 06/23/17 Yes Suzan Slick, NP  QUEtiapine (SEROQUEL) 100 MG tablet Take 100 mg by mouth at bedtime.   Yes [provider]  rosuvastatin (CRESTOR) 20 MG tablet Take 20 mg by mouth daily. 01/20/17  Yes [provider]  vitamin C (ASCORBIC ACID) 500 MG tablet Take 500 mg by mouth daily.   Yes [provider]  blood glucose meter kit and supplies KIT Dispense based on patient and insurance preference. Use up to four times daily as directed. (FOR ICD-9 250.00, 250.01). 08/18/16   Orvan Falconer, MD   [name]  Result Date:  06/22/2017 CLINICAL DATA:  Diabetic foot ulcer.  Evaluate for osteomyelitis. EXAM: MRI OF THE RIGHT FOREFOOT WITHOUT Palmer TECHNIQUE: Multiplanar, multisequence Javier imaging of the right forefoot was performed. No intravenous Palmer was administered. COMPARISON:  Right foot x-rays from same day. FINDINGS: Bones/Joint/Cartilage Postsurgical changes related to prior second toe amputation at the MTP joint. Again seen is a mildly displaced subacute fracture of the third metacarpal neck. Postsurgical changes related to prior first metatarsal base  osteotomy. There is marrow edema involving the base and shaft of the first metatarsal, base and proximal shaft of the second metatarsal, base of the third metatarsal, and all 3 cuneiforms. There is loss of the normal T1 marrow signal at the base of the first metatarsal, and in the medial cuneiform. There is cortical loss along the distal articulating aspect of the medial cuneiform. Mild osteoarthritis of the first MTP joint.  No joint effusion. Ligaments Collateral ligaments are intact. Muscles and Tendons Flexor, peroneal and extensor compartment tendons are intact. Diffuse fatty atrophy of the intrinsic muscles of the forefoot. Soft tissue Small amount of fluid at the tip of the second metatarsal head without discrete fluid collection. No hematoma. No soft tissue mass. Mild soft tissue edema over the dorsal forefoot. Small amount of fluid in the third intermetatarsal bursa. IMPRESSION: 1. Prior second toe amputation at the MTP joint with small amount of fluid at the tip of the second metatarsal head. No discrete fluid collection or second metatarsal osteomyelitis. 2. Postsurgical changes at the base of the first metatarsal and medial cuneiform with prominent marrow edema and loss of the normal T1 signal involving the base of the first metatarsal and medial cuneiform. Given recent surgery at this site and loss of the normal medial cuneiform cortex, findings are concerning for  osteomyelitis. Neuropathic arthropathy could have a similar appearance. 3. Additional marrow edema with preserved T1 marrow signal involving the first metatarsal shaft, bases of the second and third metatarsals, and middle and lateral cuneiforms could reflect neuropathic arthropathy and/or stress related changes. 4. Subacute, mildly displaced fracture of the third metatarsal neck. Electronically Signed   By: Titus Dubin M.D.   On: 06/22/2017 18:36   Dg Foot Complete Right  Result Date: 06/22/2017 CLINICAL DATA:  Swelling and pain post amputation EXAM: RIGHT FOOT COMPLETE - 3+ VIEW COMPARISON:  06/04/2017 FINDINGS: Status post amputation of the 2nd digit at the MTP joint. Fracture of the 3rd metacarpal head/neck, mildly displaced, new. Marginal erosion at the medial base of the 1st proximal phalanx, unchanged. Postsurgical changes involving the base of the 1st metatarsal. Degenerative changes at the 1st tarsometatarsal joint. Maturing bone formation at the 1st metatarsal osteotomy site. Mild soft tissue swelling in the forefoot, centered at the surgical bed. IMPRESSION: Status post amputation of the 2nd digit at the MTP joint. Fracture of the 3rd metacarpal head/neck, mildly displace, new from the prior. Mild soft tissue swelling in the forefoot. Additional stable degenerative and postsurgical changes, as described above. Electronically Signed   By: Julian Hy M.D.   On: 06/22/2017 15:52   - pertinent xrays, CT, MRI studies were reviewed and independently interpreted  Positive ROS: All other systems have been reviewed and were otherwise negative with the exception of those mentioned in the HPI and as above.  Physical Exam: General: Alert, no acute distress Psychiatric: Patient is competent for consent with normal mood and affect Lymphatic: No axillary or cervical lymphadenopathy Cardiovascular: No pedal edema Respiratory: No cyanosis, no use of accessory musculature GI: No organomegaly,  abdomen is soft and non-tender    Images:  _0 @  Labs:  Lab Results  Component Value Date   HGBA1C 6.7 (H) 06/07/2017   HGBA1C 10.3 (H) 08/17/2016   HGBA1C 7.7 (H) 12/18/2013   ESRSEDRATE 5 06/04/2017   ESRSEDRATE 110 (H) 12/19/2013   ESRSEDRATE 104 (H) 12/18/2013   CRP <0.8 06/05/2017   CRP 12.2 (H) 12/19/2013   CRP 14.9 (H) 12/18/2013   REPTSTATUS 12/24/2013  FINAL 12/18/2013   REPTSTATUS 12/24/2013 FINAL 12/18/2013   GRAMSTAIN  04/19/2011    WBC PRESENT,BOTH PMN AND MONONUCLEAR NO ORGANISMS SEEN   CULT  12/18/2013    NO GROWTH 5 DAYS Performed at [place]  12/18/2013    NO GROWTH 5 DAYS Performed at [facility]     Lab Results  Component Value Date   ALBUMIN 3.8 06/05/2017   ALBUMIN 4.3 12/14/2016   ALBUMIN 3.7 09/19/2016    Neurologic: Patient does not have protective sensation bilateral lower extremities.   MUSCULOSKELETAL:   Skin: Examination patient has cellulitis dorsally  to the midfoot.  There is maceration around the second ray amputation site.  There is no purulent drainage.  The  patient has a good palpable pulse.    There are no plantar ulcers or cellulitis.    Assessment: Assessment: Cellulitis dorsum right foot.  Plan: Plan: Would continue IV antibiotics for 3 to 5 days and then transition to oral antibiotics.  The importance of elevation and minimizing weightbearing for the right lower extremity was discussed.  Thank you for the consult and the opportunity to see Javier. Shelda Jakes, MD [place] [phone] 5:10 PM

## 2017-06-23 NOTE — Progress Notes (Signed)
Dr. Marlou Sa of [facility] called back giving verbal orders for Oxy IR 10mg  Q4 PRN for pain and NPO by midnight.  Administered PRN pain medication as ordered and noted it effective.  Patient was seen ambulating in hall.  Will monitor.

## 2017-06-23 NOTE — Progress Notes (Signed)
Received patient from [facility] ED via [facility].  Patienbt AOx4, ambulatory using a cane, VS in normal range, right foot pain at 6/10 and O2Sat at 100% on RA.  Called [facility] upon patient's arrival on floor and patient's pain medication concern.  CNA gave something to drink and ate.  Will monitor.

## 2017-06-23 NOTE — Progress Notes (Signed)
PROGRESS NOTE    [name]  MVH:[id number] DOB: 02/15/1965 DOA: 06/22/2017 PCP: Lucia Gaskins, MD    Brief Narrative:  52 y.o. male with medical history significant of diabetes, polysubstance abuse, anxiety, alcohol abuse status post right second toe amputation secondary to osteomyelitis and failing conservative measures by Dr.Duda on Jun 09, 2017 at [facility] who had follow-up with orthopedic surgery office on 06/16/2017 at which point incidentally noted per note that his wound looked good at that time and not infected.  Since that time patient has been placed on doxycycline due to purulent discharge from the area along with erythema.  Patient does not recall how long he has been on the doxycycline but at least for several days.  Patient's foot continue to get more red and swollen with purulent discharge from the area and dehiscence of the wound.  Patient denies any fevers.  Patient referred for admission for postop wound infection.  Dr. Sharol Given at [facility] has been called and made aware and advised patient can stay here at [facility] however we have no orthopedic surgery coverage here for several days.  Assessment & Plan:   Principal Problem:   Post op infection Active Problems:   Substance abuse ([place])   Cocaine abuse ([place])   Generalized anxiety disorder   Tobacco use disorder   Diabetic foot infection ([place])    Post op infection-failing outpatient doxycycline orally.   -Patient was started on empiric IV vancomycin and Zosyn.   -Patient with hx of noncompliance with his postop instructions from Dr. Sharol Given. -Have formally consulted Dr. Sharol Given  Active Problems:   Diabetic foot infection ([place]) -continue with sliding scale insulin -Stable at present  Substance abuse ([place]) -UDS pos for cocaine -will need cessation  Cocaine abuse ([place]) -Per above, pos for cocaine  Generalized anxiety disorder -Appears stable at present  Tobacco use disorder -Continue with nicotine patch as  tolerated  DVT prophylaxis: SCD's Code Status: Full Family Communication: Pt in room, family not at bedside Disposition Plan: Uncertain at this time  Consultants:   Orthopedic Surgery (Dr. Sharol Given)  Procedures:     Antimicrobials: Anti-infectives (From admission, onward)   Start     Dose/Rate Route Frequency Ordered Stop   06/22/17 2200  vancomycin (VANCOCIN) 1,500 mg in sodium chloride 0.9 % 500 mL IVPB     1,500 mg 250 mL/hr over 120 Minutes Intravenous Every 12 hours 06/22/17 1638     06/22/17 2200  piperacillin-tazobactam (ZOSYN) IVPB 3.375 g     3.375 g 12.5 mL/hr over 240 Minutes Intravenous Every 8 hours 06/22/17 1638     06/22/17 1445  vancomycin (VANCOCIN) IVPB 1000 mg/200 mL premix     1,000 mg 200 mL/hr over 60 Minutes Intravenous  Once 06/22/17 1444 06/22/17 1623   06/22/17 1430  piperacillin-tazobactam (ZOSYN) IVPB 3.375 g     3.375 g 100 mL/hr over 30 Minutes Intravenous  Once 06/22/17 1416 06/22/17 1511       Subjective: Wanting more pain medications  Objective: Vitals:   06/22/17 1955 06/22/17 2141 06/23/17 0441 06/23/17 1415  BP: 127/85 113/73 (!) 93/59 95/70  Pulse: 80 88 85 79  Resp: 17 15 18    Temp: 97.7 F (36.5 C) 97.8 F (36.6 C) 97.7 F (36.5 C) 97.9 F (36.6 C)  TempSrc:  Oral Oral Oral  SpO2: 100% 100% 100% 100%  Weight:      Height:        Intake/Output Summary (Last 24 hours) at 06/23/2017 1433  Last data filed at 06/23/2017 0614 Gross per 24 hour  Intake 1854.5 ml  Output 850 ml  Net 1004.5 ml   Filed Weights   06/22/17 1349  Weight: 81.6 kg (180 lb)    Examination:  General exam: Appears calm and comfortable  Respiratory system: Clear to auscultation. Respiratory effort normal. Cardiovascular system: S1 & S2 heard, RRR Gastrointestinal system: Abdomen is nondistended, soft and nontender. No organomegaly or masses felt. Normal bowel sounds heard. Central nervous system: Alert and oriented. No focal neurological  deficits. Extremities: Symmetric 5 x 5 power. Skin: R post-op wound with purulence, no bleeding, ulcer at ball of L foot with central granulation Psychiatry: Judgement and insight appear normal. Mood & affect appropriate.   Data Reviewed: I have personally reviewed following labs and imaging studies  CBC: Recent Labs  Lab 06/22/17 1457  WBC 8.5  NEUTROABS 5.1  HGB 13.2  HCT 40.6  MCV 84.4  PLT 277   Basic Metabolic Panel: Recent Labs  Lab 06/22/17 1457  NA 138  K 4.0  CL 102  CO2 24  GLUCOSE 172*  BUN 12  CREATININE 0.72  CALCIUM 9.7   GFR: Estimated Creatinine Clearance: 121.8 mL/min (by C-G formula based on SCr of 0.72 mg/dL). Liver Function Tests: No results for input(s): AST, ALT, ALKPHOS, BILITOT, PROT, ALBUMIN in the last 168 hours. No results for input(s): LIPASE, AMYLASE in the last 168 hours. No results for input(s): AMMONIA in the last 168 hours. Coagulation Profile: No results for input(s): INR, PROTIME in the last 168 hours. Cardiac Enzymes: No results for input(s): CKTOTAL, CKMB, CKMBINDEX, TROPONINI in the last 168 hours. BNP (last 3 results) No results for input(s): PROBNP in the last 8760 hours. HbA1C: No results for input(s): HGBA1C in the last 72 hours. CBG: Recent Labs  Lab 06/22/17 1425 06/22/17 1839 06/22/17 2146 06/23/17 0744 06/23/17 1207  GLUCAP 209* 203* 188* 135* 110*   Lipid Profile: No results for input(s): CHOL, HDL, LDLCALC, TRIG, CHOLHDL, LDLDIRECT in the last 72 hours. Thyroid Function Tests: No results for input(s): TSH, T4TOTAL, FREET4, T3FREE, THYROIDAB in the last 72 hours. Anemia Panel: No results for input(s): VITAMINB12, FOLATE, FERRITIN, TIBC, IRON, RETICCTPCT in the last 72 hours. Sepsis Labs: No results for input(s): PROCALCITON, LATICACIDVEN in the last 168 hours.  No results found for this or any previous visit (from the past 240 hour(s)).   Radiology Studies: Mr Foot Right Wo Contrast  Result Date:  06/22/2017 CLINICAL DATA:  Diabetic foot ulcer.  Evaluate for osteomyelitis. EXAM: MRI OF THE RIGHT FOREFOOT WITHOUT CONTRAST TECHNIQUE: Multiplanar, multisequence MR imaging of the right forefoot was performed. No intravenous contrast was administered. COMPARISON:  Right foot x-rays from same day. FINDINGS: Bones/Joint/Cartilage Postsurgical changes related to prior second toe amputation at the MTP joint. Again seen is a mildly displaced subacute fracture of the third metacarpal neck. Postsurgical changes related to prior first metatarsal base osteotomy. There is marrow edema involving the base and shaft of the first metatarsal, base and proximal shaft of the second metatarsal, base of the third metatarsal, and all 3 cuneiforms. There is loss of the normal T1 marrow signal at the base of the first metatarsal, and in the medial cuneiform. There is cortical loss along the distal articulating aspect of the medial cuneiform. Mild osteoarthritis of the first MTP joint.  No joint effusion. Ligaments Collateral ligaments are intact. Muscles and Tendons Flexor, peroneal and extensor compartment tendons are intact. Diffuse fatty atrophy of the intrinsic muscles  of the forefoot. Soft tissue Small amount of fluid at the tip of the second metatarsal head without discrete fluid collection. No hematoma. No soft tissue mass. Mild soft tissue edema over the dorsal forefoot. Small amount of fluid in the third intermetatarsal bursa. IMPRESSION: 1. Prior second toe amputation at the MTP joint with small amount of fluid at the tip of the second metatarsal head. No discrete fluid collection or second metatarsal osteomyelitis. 2. Postsurgical changes at the base of the first metatarsal and medial cuneiform with prominent marrow edema and loss of the normal T1 signal involving the base of the first metatarsal and medial cuneiform. Given recent surgery at this site and loss of the normal medial cuneiform cortex, findings are concerning for  osteomyelitis. Neuropathic arthropathy could have a similar appearance. 3. Additional marrow edema with preserved T1 marrow signal involving the first metatarsal shaft, bases of the second and third metatarsals, and middle and lateral cuneiforms could reflect neuropathic arthropathy and/or stress related changes. 4. Subacute, mildly displaced fracture of the third metatarsal neck. Electronically Signed   By: Titus Dubin M.D.   On: 06/22/2017 18:36   Dg Foot Complete Right  Result Date: 06/22/2017 CLINICAL DATA:  Swelling and pain post amputation EXAM: RIGHT FOOT COMPLETE - 3+ VIEW COMPARISON:  06/04/2017 FINDINGS: Status post amputation of the 2nd digit at the MTP joint. Fracture of the 3rd metacarpal head/neck, mildly displaced, new. Marginal erosion at the medial base of the 1st proximal phalanx, unchanged. Postsurgical changes involving the base of the 1st metatarsal. Degenerative changes at the 1st tarsometatarsal joint. Maturing bone formation at the 1st metatarsal osteotomy site. Mild soft tissue swelling in the forefoot, centered at the surgical bed. IMPRESSION: Status post amputation of the 2nd digit at the MTP joint. Fracture of the 3rd metacarpal head/neck, mildly displace, new from the prior. Mild soft tissue swelling in the forefoot. Additional stable degenerative and postsurgical changes, as described above. Electronically Signed   By: Julian Hy M.D.   On: 06/22/2017 15:52    Scheduled Meds: . diltiazem  30 mg Oral BID  . insulin aspart  0-9 Units Subcutaneous TID WC  . lisinopril  20 mg Oral Daily  . nicotine  21 mg Transdermal Once  . QUEtiapine  100 mg Oral QHS  . rosuvastatin  20 mg Oral q1800  . sodium chloride flush  3 mL Intravenous Q12H   Continuous Infusions: . sodium chloride 250 mL (06/22/17 2200)  . piperacillin-tazobactam (ZOSYN)  IV 3.375 g (06/23/17 1426)  . vancomycin Stopped (06/23/17 1346)     LOS: 0 days   Marylu Lund, MD [facility] Pager  [phone]  If 7PM-7AM, please contact night-coverage [email or web address] Password [facility] 06/23/2017, 2:33 PM

## 2017-06-23 NOTE — Care Management Note (Signed)
Case Management Note  Patient Details  Name: [name] MRN: [id number] Date of Birth: [birth year]  Subjective/Objective:                    Action/Plan: Patient from home active with [facility] for HHPT , will need order and face to face to resume care  Expected Discharge Date:                  Expected Discharge Plan:  [name]  In-House Referral:     Discharge planning Services  CM Consult  Post Acute Care Choice:    Choice offered to:     DME Arranged:    DME Agency:     HH Arranged:    [place] Agency:     Status of Service:     If discussed at [employer], dates discussed:    Additional Comments:  Marilu Favre, RN 06/23/2017, 11:24 AM

## 2017-06-23 NOTE — Plan of Care (Signed)
  Problem: Education: Goal: Knowledge of General Education information will improve Outcome: Progressing   Problem: Health Behavior/Discharge Planning: Goal: Ability to manage health-related needs will improve Outcome: Progressing   Problem: Clinical Measurements: Goal: Ability to maintain clinical measurements within normal limits will improve Outcome: Progressing   

## 2017-06-24 ENCOUNTER — Ambulatory Visit (INDEPENDENT_AMBULATORY_CARE_PROVIDER_SITE_OTHER): Payer: Medicare Other | Admitting: Orthopedic Surgery

## 2017-06-24 LAB — GLUCOSE, CAPILLARY
GLUCOSE-CAPILLARY: 154 mg/dL — AB (ref 65–99)
GLUCOSE-CAPILLARY: 184 mg/dL — AB (ref 65–99)
GLUCOSE-CAPILLARY: 141 mg/dL — AB (ref 65–99)
Glucose-Capillary: 219 mg/dL — ABNORMAL HIGH (ref 65–99)

## 2017-06-24 LAB — VANCOMYCIN, TROUGH
Vancomycin Tr: 31 ug/mL (ref 15–20)
Vancomycin Tr: 32 ug/mL (ref 15–20)

## 2017-06-24 MED ORDER — INSULIN ASPART 100 UNIT/ML ~~LOC~~ SOLN
3.0000 [IU] | Freq: Three times a day (TID) | SUBCUTANEOUS | Status: DC
  Administered 2017-06-24 – 2017-06-28 (×11): 3 [IU] via SUBCUTANEOUS

## 2017-06-24 MED ORDER — NICOTINE 14 MG/24HR TD PT24
14.0000 mg | MEDICATED_PATCH | Freq: Every day | TRANSDERMAL | Status: DC
  Administered 2017-06-24: 14 mg via TRANSDERMAL
  Filled 2017-06-24: qty 1

## 2017-06-24 MED ORDER — POLYETHYLENE GLYCOL 3350 17 G PO PACK
17.0000 g | PACK | Freq: Once | ORAL | Status: AC
  Administered 2017-06-24: 17 g via ORAL
  Filled 2017-06-24: qty 1

## 2017-06-24 MED ORDER — VANCOMYCIN HCL IN DEXTROSE 750-5 MG/150ML-% IV SOLN
750.0000 mg | Freq: Two times a day (BID) | INTRAVENOUS | Status: DC
  Administered 2017-06-25: 750 mg via INTRAVENOUS
  Filled 2017-06-24: qty 150

## 2017-06-24 MED ORDER — NICOTINE 21 MG/24HR TD PT24
21.0000 mg | MEDICATED_PATCH | Freq: Every day | TRANSDERMAL | Status: DC
Start: 2017-06-25 — End: 2017-06-28
  Administered 2017-06-25 – 2017-06-28 (×4): 21 mg via TRANSDERMAL
  Filled 2017-06-24 (×4): qty 1

## 2017-06-24 NOTE — Progress Notes (Signed)
Pharmacy Antibiotic Note  [name] is a 52 y.o. male admitted on 06/22/2017 with wound infection and started on vancomycin and Zosyn.  MRI  Concerning for first metatarsal osteomyelitis.   Renal function is stable, afebrile, WBC normalized.  Vancomycin trough drawn AFTER dose was administered; therefore, the level of 31 mcg/mL is inaccurate.  RN aware vancomycin trough is rescheduled for tonight.    Plan: Continue vanc 1500mg  IV Q12H for goal of 15-20 mcg/mL Zosyn EID 3.375gm IV Q8H Monitor renal fxn, clinical progress, repeat vanc trough tonight   Height: 6\' 3"  (190.5 cm) Weight: 180 lb (81.6 kg) IBW/kg (Calculated) : 84.5  Temp (24hrs), Avg:98.2 F (36.8 C), Min:97.8 F (36.6 C), Max:99 F (37.2 C)  Recent Labs  Lab 06/22/17 1457 06/24/17 1014  WBC 8.5  --   CREATININE 0.72  --   VANCOTROUGH  --  31*    Estimated Creatinine Clearance: 121.8 mL/min (by C-G formula based on SCr of 0.72 mg/dL).    Allergies  Allergen Reactions  . Benadryl [Diphenhydramine Hcl] Other (See Comments)    Leg spasms   . Trazodone And Nefazodone Other (See Comments)    Leg spasms      Vanc 6/4>> Zosyn 6/4>> Doxy PTA  6/6 0930 VT = 31 mcg/mL (drawn AFTER dose hung) 6/6 2130 VT  5/18MRSA PCR -negative   Karne Ozga D. Mina Marble, PharmD, BCPS, [place] Pager:  [phone] 06/24/2017, 12:52 PM

## 2017-06-24 NOTE — Care Management Note (Addendum)
Case Management Note  Patient Details  Name: [name] MRN: [id number] Date of Birth: 1965-09-03  Subjective/Objective:                    Action/Plan:  Patient from home with home health PT  Through [place] . Will need orders for resumption of HHPT . Entered orders for MD signature. Linna Hoff [facility] aware patient has been admitted and is following. Expected Discharge Date:                  Expected Discharge Plan:  [place]  In-House Referral:     Discharge planning Services  CM Consult  Post Acute Care Choice:    Choice offered to:  Patient  DME Arranged:  N/A DME Agency:     HH Arranged:  PT HH Agency:     Status of Service:  In process, will continue to follow  If discussed at Long Length of Stay Meetings, dates discussed:    Additional Comments:  Marilu Favre, RN 06/24/2017, 11:07 AM

## 2017-06-24 NOTE — Progress Notes (Signed)
PROGRESS NOTE    [name]  IZT:[id number] DOB: Jul 25, 1965 DOA: 06/22/2017 PCP: Lucia Gaskins, MD    Brief Narrative:  52 y.o. male with medical history significant of diabetes, polysubstance abuse, anxiety, alcohol abuse status post right second toe amputation secondary to osteomyelitis and failing conservative measures by Dr.Duda on Jun 09, 2017 at [facility] who had follow-up with orthopedic surgery office on 06/16/2017 at which point incidentally noted per note that his wound looked good at that time and not infected.  Since that time patient has been placed on doxycycline due to purulent discharge from the area along with erythema.  Patient does not recall how long he has been on the doxycycline but at least for several days.  Patient's foot continue to get more red and swollen with purulent discharge from the area and dehiscence of the wound.  Patient denies any fevers.  Patient referred for admission for postop wound infection.  Dr. Sharol Given at [facility] has been called and made aware and advised patient can stay here at [facility] however we have no orthopedic surgery coverage here for several days.  Assessment & Plan:   Principal Problem:   Post op infection Active Problems:   Substance abuse ([place])   Cocaine abuse ([place])   Generalized anxiety disorder   Tobacco use disorder   Diabetic foot infection ([place])    Post op infection-failing outpatient doxycycline orally.   -Patient was started on empiric IV vancomycin and Zosyn.   -Patient with hx of noncompliance with his postop instructions from Dr. Sharol Given. -Appreciate input by Dr. Sharol Given.  Recommendations for continued IV antibiotics for 3 to 5 days. -Clinically stable at this time.  Active Problems:   Diabetic foot infection ([place]) -continue with sliding scale insulin -Glucose noted to be in the 200s.  Appreciate input by diabetic coordinator. -We will start patient on pre-meal coverage  Substance abuse ([place]) -UDS pos for  cocaine -Cessation will need to be done.  Cocaine abuse ([place]) -Per above, pos for cocaine on UDS.  Generalized anxiety disorder -Appears stable currently.  Tobacco use disorder -Continue with nicotine patch as tolerated -Patient tolerating patch thus far  DVT prophylaxis: SCD's Code Status: Full Family Communication: Pt in room, family not at bedside Disposition Plan: Anticipate discharge home following 3 to 5 days of IV antibiotics  Consultants:   Orthopedic Surgery (Dr. Sharol Given)  Procedures:     Antimicrobials: Anti-infectives (From admission, onward)   Start     Dose/Rate Route Frequency Ordered Stop   06/22/17 2200  vancomycin (VANCOCIN) 1,500 mg in sodium chloride 0.9 % 500 mL IVPB     1,500 mg 250 mL/hr over 120 Minutes Intravenous Every 12 hours 06/22/17 1638     06/22/17 2200  piperacillin-tazobactam (ZOSYN) IVPB 3.375 g     3.375 g 12.5 mL/hr over 240 Minutes Intravenous Every 8 hours 06/22/17 1638     06/22/17 1445  vancomycin (VANCOCIN) IVPB 1000 mg/200 mL premix     1,000 mg 200 mL/hr over 60 Minutes Intravenous  Once 06/22/17 1444 06/22/17 1623   06/22/17 1430  piperacillin-tazobactam (ZOSYN) IVPB 3.375 g     3.375 g 100 mL/hr over 30 Minutes Intravenous  Once 06/22/17 1416 06/22/17 1511      Subjective: Patient requesting continuation of nicotine patch  Objective: Vitals:   06/23/17 1415 06/23/17 2017 06/24/17 0604 06/24/17 1241  BP: 95/70 110/66 112/66 106/66  Pulse: 79 (!) 54 90 83  Resp:  18 16   Temp: 97.9  F (36.6 C) 98.1 F (36.7 C) 97.8 F (36.6 C) 99 F (37.2 C)  TempSrc: Oral Oral Oral Oral  SpO2: 100% 100% 99% 97%  Weight:      Height:        Intake/Output Summary (Last 24 hours) at 06/24/2017 1708 Last data filed at 06/24/2017 1513 Gross per 24 hour  Intake 2146.17 ml  Output -  Net 2146.17 ml   Filed Weights   06/22/17 1349  Weight: 81.6 kg (180 lb)    Examination: General exam: Awake, laying in bed, in nad Respiratory  system: Normal respiratory effort, no wheezing Cardiovascular system: regular rate, s1, s2 Gastrointestinal system: Soft, nondistended, positive BS Central nervous system: CN2-12 grossly intact, strength intact Extremities: Perfused, no clubbing Skin: Normal skin turgor, no notable skin lesions seen Psychiatry: Mood normal // no visual hallucinations    Data Reviewed: I have personally reviewed following labs and imaging studies  CBC: Recent Labs  Lab 06/22/17 1457  WBC 8.5  NEUTROABS 5.1  HGB 13.2  HCT 40.6  MCV 84.4  PLT 951   Basic Metabolic Panel: Recent Labs  Lab 06/22/17 1457  NA 138  K 4.0  CL 102  CO2 24  GLUCOSE 172*  BUN 12  CREATININE 0.72  CALCIUM 9.7   GFR: Estimated Creatinine Clearance: 121.8 mL/min (by C-G formula based on SCr of 0.72 mg/dL). Liver Function Tests: No results for input(s): AST, ALT, ALKPHOS, BILITOT, PROT, ALBUMIN in the last 168 hours. No results for input(s): LIPASE, AMYLASE in the last 168 hours. No results for input(s): AMMONIA in the last 168 hours. Coagulation Profile: No results for input(s): INR, PROTIME in the last 168 hours. Cardiac Enzymes: No results for input(s): CKTOTAL, CKMB, CKMBINDEX, TROPONINI in the last 168 hours. BNP (last 3 results) No results for input(s): PROBNP in the last 8760 hours. HbA1C: No results for input(s): HGBA1C in the last 72 hours. CBG: Recent Labs  Lab 06/23/17 1207 06/23/17 1655 06/23/17 2119 06/24/17 0722 06/24/17 1149  GLUCAP 110* 322* 126* 154* 219*   Lipid Profile: No results for input(s): CHOL, HDL, LDLCALC, TRIG, CHOLHDL, LDLDIRECT in the last 72 hours. Thyroid Function Tests: No results for input(s): TSH, T4TOTAL, FREET4, T3FREE, THYROIDAB in the last 72 hours. Anemia Panel: No results for input(s): VITAMINB12, FOLATE, FERRITIN, TIBC, IRON, RETICCTPCT in the last 72 hours. Sepsis Labs: No results for input(s): PROCALCITON, LATICACIDVEN in the last 168 hours.  No results  found for this or any previous visit (from the past 240 hour(s)).   Radiology Studies: Mr Foot Right Wo Contrast  Result Date: 06/22/2017 CLINICAL DATA:  Diabetic foot ulcer.  Evaluate for osteomyelitis. EXAM: MRI OF THE RIGHT FOREFOOT WITHOUT CONTRAST TECHNIQUE: Multiplanar, multisequence MR imaging of the right forefoot was performed. No intravenous contrast was administered. COMPARISON:  Right foot x-rays from same day. FINDINGS: Bones/Joint/Cartilage Postsurgical changes related to prior second toe amputation at the MTP joint. Again seen is a mildly displaced subacute fracture of the third metacarpal neck. Postsurgical changes related to prior first metatarsal base osteotomy. There is marrow edema involving the base and shaft of the first metatarsal, base and proximal shaft of the second metatarsal, base of the third metatarsal, and all 3 cuneiforms. There is loss of the normal T1 marrow signal at the base of the first metatarsal, and in the medial cuneiform. There is cortical loss along the distal articulating aspect of the medial cuneiform. Mild osteoarthritis of the first MTP joint.  No joint effusion. Ligaments  Collateral ligaments are intact. Muscles and Tendons Flexor, peroneal and extensor compartment tendons are intact. Diffuse fatty atrophy of the intrinsic muscles of the forefoot. Soft tissue Small amount of fluid at the tip of the second metatarsal head without discrete fluid collection. No hematoma. No soft tissue mass. Mild soft tissue edema over the dorsal forefoot. Small amount of fluid in the third intermetatarsal bursa. IMPRESSION: 1. Prior second toe amputation at the MTP joint with small amount of fluid at the tip of the second metatarsal head. No discrete fluid collection or second metatarsal osteomyelitis. 2. Postsurgical changes at the base of the first metatarsal and medial cuneiform with prominent marrow edema and loss of the normal T1 signal involving the base of the first metatarsal  and medial cuneiform. Given recent surgery at this site and loss of the normal medial cuneiform cortex, findings are concerning for osteomyelitis. Neuropathic arthropathy could have a similar appearance. 3. Additional marrow edema with preserved T1 marrow signal involving the first metatarsal shaft, bases of the second and third metatarsals, and middle and lateral cuneiforms could reflect neuropathic arthropathy and/or stress related changes. 4. Subacute, mildly displaced fracture of the third metatarsal neck. Electronically Signed   By: Titus Dubin M.D.   On: 06/22/2017 18:36    Scheduled Meds: . diltiazem  30 mg Oral BID  . insulin aspart  0-9 Units Subcutaneous TID WC  . insulin aspart  3 Units Subcutaneous TID WC  . lisinopril  20 mg Oral Daily  . [START ON 06/25/2017] nicotine  21 mg Transdermal Daily  . QUEtiapine  100 mg Oral QHS  . rosuvastatin  20 mg Oral q1800  . sodium chloride flush  3 mL Intravenous Q12H   Continuous Infusions: . sodium chloride 250 mL (06/22/17 2200)  . piperacillin-tazobactam (ZOSYN)  IV 3.375 g (06/24/17 1440)  . vancomycin Stopped (06/24/17 1230)     LOS: 1 day   Marylu Lund, MD [facility] Pager [phone]  If 7PM-7AM, please contact night-coverage [email or web address] Password [email or web address] 06/24/2017, 5:08 PM

## 2017-06-24 NOTE — Progress Notes (Signed)
Pharmacy Antibiotic Note  [name] is a 52 y.o. male admitted on 06/22/2017 with wound infection and started on vancomycin and Zosyn.  MRI concerning for first metatarsal osteomyelitis.   Renal function is stable, afebrile, WBC normalized.  Vancomycin trough is 32 mcg/mL and supratherapeutic.   Plan: Decrease vancomycin to 750 mg IV q12h for goal of 15-20 mcg/mL Zosyn 3.375 g IV q8h to be infused over 4 hours Monitor renal fxn, clinical progress Vancomycin trough at steady-state   Height: 6\' 3"  (190.5 cm) Weight: 180 lb (81.6 kg) IBW/kg (Calculated) : 84.5  Temp (24hrs), Avg:98.2 F (36.8 C), Min:97.8 F (36.6 C), Max:99 F (37.2 C)  Recent Labs  Lab 06/22/17 1457 06/24/17 1014 06/24/17 2134  WBC 8.5  --   --   CREATININE 0.72  --   --   VANCOTROUGH  --  31* 32*    Estimated Creatinine Clearance: 121.8 mL/min (by C-G formula based on SCr of 0.72 mg/dL).    Allergies  Allergen Reactions  . Benadryl [Diphenhydramine Hcl] Other (See Comments)    Leg spasms   . Trazodone And Nefazodone Other (See Comments)    Leg spasms     Vanc 6/4>> Zosyn 6/4>> Doxy PTA  6/6 0930 VT = 31 mcg/mL (drawn 6 min AFTER dose hung) 6/6 2130 VT = 32 mcg/mL  5/18MRSA PCR -negative   Renold Genta, PharmD, BCPS Clinical Pharmacist Clinical phone for 06/24/2017 until 10p is [phone] 06/24/2017 10:23 PM '

## 2017-06-24 NOTE — Progress Notes (Addendum)
Critical value Vanc trough-31, Dr. Rhetta Mura was made aware. Vancomycin  was started before blood drawn for Vanc trough. Pharmacist is aware and advised to draw blood before the next Vancomycin scheduled dose.

## 2017-06-24 NOTE — Plan of Care (Signed)
  Problem: Nutrition: Goal: Adequate nutrition will be maintained Outcome: Progressing   Problem: Elimination: Goal: Will not experience complications related to bowel motility Outcome: Progressing Goal: Will not experience complications related to urinary retention Outcome: Progressing   Problem: Safety: Goal: Ability to remain free from injury will improve Outcome: Progressing   

## 2017-06-24 NOTE — Progress Notes (Signed)
Inpatient Diabetes Program Recommendations  AACE/ADA: New Consensus Statement on Inpatient Glycemic Control (2015)  Target Ranges:  Prepandial:   less than 140 mg/dL      Peak postprandial:   less than 180 mg/dL (1-2 hours)      Critically ill patients:  140 - 180 mg/dL   Lab Results  Component Value Date   GLUCAP 219 (H) 06/24/2017   HGBA1C 6.7 (H) 06/07/2017    Review of Glycemic ControlResults for [name], [name] (MRN [id number]) as of 06/24/2017 13:50  Ref. Range 06/23/2017 12:07 06/23/2017 16:55 06/23/2017 21:19 06/24/2017 07:22 06/24/2017 11:49  Glucose-Capillary Latest Ref Range: 65 - 99 mg/dL 110 (H) 322 (H) 126 (H) 154 (H) 219 (H)    Diabetes history: Type 2 DM Outpatient Diabetes medications: Metformin 1000 mg bid Current orders for Inpatient glycemic control:  Novolog sensitive tid with meals  Inpatient Diabetes Program Recommendations:   While in the hospital, consider adding Novolog meal coverage 3 units tid with meals (hold if patient eats less than 50%).   Thanks,  Adah Perl, RN, BC-ADM Inpatient Diabetes Coordinator Pager [phone] (8a-5p)

## 2017-06-25 LAB — BASIC METABOLIC PANEL
Anion gap: 9 (ref 5–15)
BUN: 13 mg/dL (ref 6–20)
CALCIUM: 9.2 mg/dL (ref 8.9–10.3)
CO2: 23 mmol/L (ref 22–32)
CREATININE: 1.22 mg/dL (ref 0.61–1.24)
Chloride: 108 mmol/L (ref 101–111)
GFR calc Af Amer: >60 mL/min (ref >60)
GFR calc non Af Amer: >60 mL/min (ref >60)
GLUCOSE: 255 mg/dL — AB (ref 65–99)
Potassium: 4.2 mmol/L (ref 3.5–5.1)
Sodium: 140 mmol/L (ref 135–145)

## 2017-06-25 LAB — GLUCOSE, CAPILLARY
GLUCOSE-CAPILLARY: 142 mg/dL — AB (ref 65–99)
GLUCOSE-CAPILLARY: 181 mg/dL — AB (ref 65–99)
Glucose-Capillary: 246 mg/dL — ABNORMAL HIGH (ref 65–99)
Glucose-Capillary: 152 mg/dL — ABNORMAL HIGH (ref 65–99)

## 2017-06-25 MED ORDER — LACTULOSE 10 GM/15ML PO SOLN
30.0000 g | ORAL | Status: AC
  Administered 2017-06-25: 30 g via ORAL
  Filled 2017-06-25: qty 45

## 2017-06-25 MED ORDER — DOXYCYCLINE HYCLATE 100 MG PO TABS
100.0000 mg | ORAL_TABLET | Freq: Two times a day (BID) | ORAL | Status: DC
  Administered 2017-06-25 – 2017-06-28 (×7): 100 mg via ORAL
  Filled 2017-06-25 (×7): qty 1

## 2017-06-25 MED ORDER — LACTATED RINGERS IV SOLN
INTRAVENOUS | Status: AC
  Administered 2017-06-25 – 2017-06-26 (×2): via INTRAVENOUS

## 2017-06-25 NOTE — Care Management Important Message (Signed)
Important Message  Patient Details  Name: [name] MRN: [id number] Date of Birth: [birth year]   Medicare Important Message Given:  Yes    [name] [address] 06/25/2017, 4:17 PM

## 2017-06-25 NOTE — Progress Notes (Signed)
PROGRESS NOTE    [name]  WNU:[id number] DOB: 1965/09/08 DOA: 06/22/2017 PCP: Lucia Gaskins, MD    Brief Narrative:  52 y.o. 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  Since that time patient has been placed on doxycycline due to purulent discharge from the area along with erythema.  Patient does not recall how long he has been on the doxycycline but at least for several days.  Patient's foot continue to get more red and swollen with purulent discharge from the area and dehiscence of the wound.  Patient denies any fevers.  Patient referred for admission for postop wound infection.  Dr. Sharol Given at [facility] has been called and made aware and advised patient can stay here at [facility] however we have no orthopedic surgery coverage here for several days.  Assessment & Plan:   Principal Problem:   Post op infection Active Problems:   Substance abuse ([place])   Cocaine abuse ([place])   Generalized anxiety disorder   Tobacco use disorder   Diabetic foot infection ([place])    Post op infection-failing outpatient doxycycline orally.   -Patient was started on empiric IV vancomycin and Zosyn.   -Patient with hx of noncompliance with his postop instructions from Dr. Sharol Given. -Appreciate input by Dr. Sharol Given.  Recommendations for continued IV antibiotics for 3 to 5 days. -Patient remains afebrile. -This morning, creatinine had risen to over 1.2 -Will discontinue vancomycin and Zosyn.  Will transition patient to oral doxycycline  Active Problems:   Diabetic foot infection ([place]) -continue with sliding scale insulin -Glucose noted to be in the 200s.   Appreciate input by diabetic coordinator. -Started patient on pre-meal coverage  Substance abuse ([place]) -UDS pos for cocaine -Cessation will need to be done.  Cocaine abuse ([place]) -Per above, pos for cocaine on UDS. -At this time  Generalized anxiety disorder -Appears stable currently.  Tobacco use disorder -Continue with nicotine patch as tolerated -Patient tolerating patch thus far  Acute renal failure, new finding -Patient with creatinine of 1.2 in the setting of concurrent vancomycin and Zosyn -We will discontinue both antibiotics and discontinue lisinopril -We will continue patient on gentle IV fluid hydration -Repeat basic metabolic panel in the morning  DVT prophylaxis: SCD's Code Status: Full Family Communication: Pt in room, family not at bedside Disposition Plan: Anticipate discharge home following 3 to 5 days of IV antibiotics  Consultants:   Orthopedic Surgery (Dr. Sharol Given)  Procedures:     Antimicrobials: Anti-infectives (From admission, onward)   Start     Dose/Rate Route Frequency Ordered Stop   06/25/17 1430  doxycycline (VIBRA-TABS) tablet 100 mg     100 mg Oral Every 12 hours 06/25/17 1418     06/25/17 1000  vancomycin (VANCOCIN) IVPB 750 mg/150 ml premix  Status:  Discontinued     750 mg 150 mL/hr over 60 Minutes Intravenous Every 12 hours 06/24/17 2230 06/25/17 1321   06/22/17 2200  vancomycin (VANCOCIN) 1,500 mg in sodium chloride 0.9 % 500 mL IVPB  Status:  Discontinued     1,500 mg 250 mL/hr over 120 Minutes Intravenous Every 12 hours 06/22/17 1638 06/24/17 2230   06/22/17 2200  piperacillin-tazobactam (ZOSYN) IVPB 3.375  g  Status:  Discontinued     3.375 g 12.5 mL/hr over 240 Minutes Intravenous Every 8 hours 06/22/17 1638 06/25/17 1321   06/22/17 1445  vancomycin (VANCOCIN) IVPB 1000 mg/200 mL premix     1,000 mg 200 mL/hr over 60 Minutes Intravenous  Once 06/22/17 1444 06/22/17 1623   06/22/17 1430  piperacillin-tazobactam (ZOSYN) IVPB 3.375  g     3.375 g 100 mL/hr over 30 Minutes Intravenous  Once 06/22/17 1416 06/22/17 1511      Subjective: Patient is without complaints.  Objective: Vitals:   06/24/17 2055 06/25/17 0526 06/25/17 1359 06/25/17 1400  BP: 100/74 109/65 (!) 150/119 104/69  Pulse: 87 85 (!) 139 82  Resp: 17 17 16    Temp: 97.8 F (36.6 C) (!) 97.5 F (36.4 C) 98.2 F (36.8 C)   TempSrc: Oral  Oral   SpO2: 97% 99% 100%   Weight:      Height:        Intake/Output Summary (Last 24 hours) at 06/25/2017 1756 Last data filed at 06/25/2017 0046 Gross per 24 hour  Intake 620 ml  Output -  Net 620 ml   Filed Weights   06/22/17 1349  Weight: 81.6 kg (180 lb)    Examination: General exam: Conversant, in no acute distress Respiratory system: normal chest rise, clear, no audible wheezing Cardiovascular system: regular rhythm, s1-s2 Gastrointestinal system: Nondistended, nontender, pos BS Central nervous system: No seizures, no tremors Extremities: No cyanosis, no joint deformities Skin: No rashes, no pallor Psychiatry: Affect normal // no auditory hallucinations   Data Reviewed: I have personally reviewed following labs and imaging studies  CBC: Recent Labs  Lab 06/22/17 1457  WBC 8.5  NEUTROABS 5.1  HGB 13.2  HCT 40.6  MCV 84.4  PLT 272   Basic Metabolic Panel: Recent Labs  Lab 06/22/17 1457 06/25/17 0803  NA 138 140  K 4.0 4.2  CL 102 108  CO2 24 23  GLUCOSE 172* 255*  BUN 12 13  CREATININE 0.72 1.22  CALCIUM 9.7 9.2   GFR: Estimated Creatinine Clearance: 79.9 mL/min (by C-G formula based on SCr of 1.22 mg/dL). Liver Function Tests: No results for input(s): AST, ALT, ALKPHOS, BILITOT, PROT, ALBUMIN in the last 168 hours. No results for input(s): LIPASE, AMYLASE in the last 168 hours. No results for input(s): AMMONIA in the last 168 hours. Coagulation Profile: No results for input(s): INR, PROTIME in the last 168 hours. Cardiac Enzymes: No results for input(s): CKTOTAL,  CKMB, CKMBINDEX, TROPONINI in the last 168 hours. BNP (last 3 results) No results for input(s): PROBNP in the last 8760 hours. HbA1C: No results for input(s): HGBA1C in the last 72 hours. CBG: Recent Labs  Lab 06/24/17 1703 06/24/17 2056 06/25/17 0740 06/25/17 1240 06/25/17 1641  GLUCAP 184* 141* 246* 142* 181*   Lipid Profile: No results for input(s): CHOL, HDL, LDLCALC, TRIG, CHOLHDL, LDLDIRECT in the last 72 hours. Thyroid Function Tests: No results for input(s): TSH, T4TOTAL, FREET4, T3FREE, THYROIDAB in the last 72 hours. Anemia Panel: No results for input(s): VITAMINB12, FOLATE, FERRITIN, TIBC, IRON, RETICCTPCT in the last 72 hours. Sepsis Labs: No results for input(s): PROCALCITON, LATICACIDVEN in the last 168 hours.  No results found for this or any previous visit (from the past 240 hour(s)).   Radiology Studies: No results found.  Scheduled Meds: . diltiazem  30 mg Oral BID  . doxycycline  100 mg Oral Q12H  . insulin aspart  0-9 Units Subcutaneous TID WC  .  insulin aspart  3 Units Subcutaneous TID WC  . nicotine  21 mg Transdermal Daily  . QUEtiapine  100 mg Oral QHS  . rosuvastatin  20 mg Oral q1800   Continuous Infusions: . lactated ringers 50 mL/hr at 06/25/17 1351     LOS: 2 days   Marylu Lund, MD [facility] Pager [phone]  If 7PM-7AM, please contact night-coverage [email or web address] Password [facility] 06/25/2017, 5:56 PM

## 2017-06-26 LAB — GLUCOSE, CAPILLARY
GLUCOSE-CAPILLARY: 204 mg/dL — AB (ref 65–99)
Glucose-Capillary: 151 mg/dL — ABNORMAL HIGH (ref 65–99)
Glucose-Capillary: 223 mg/dL — ABNORMAL HIGH (ref 65–99)
Glucose-Capillary: 156 mg/dL — ABNORMAL HIGH (ref 65–99)

## 2017-06-26 LAB — BASIC METABOLIC PANEL
ANION GAP: 8 (ref 5–15)
BUN: 14 mg/dL (ref 6–20)
CALCIUM: 10 mg/dL (ref 8.9–10.3)
CO2: 27 mmol/L (ref 22–32)
Chloride: 105 mmol/L (ref 101–111)
Creatinine, Ser: 1.21 mg/dL (ref 0.61–1.24)
GFR calc Af Amer: >60 mL/min (ref >60)
GLUCOSE: 204 mg/dL — AB (ref 65–99)
Potassium: 4.4 mmol/L (ref 3.5–5.1)
Sodium: 140 mmol/L (ref 135–145)

## 2017-06-26 MED ORDER — SORBITOL 70 % SOLN
30.0000 mL | Status: AC
  Administered 2017-06-26: 30 mL via ORAL
  Filled 2017-06-26: qty 30

## 2017-06-26 NOTE — Progress Notes (Signed)
PROGRESS NOTE    [name]  YPP:[id number] DOB: [birth year] DOA: 06/22/2017 PCP: Lucia Gaskins, MD    Brief Narrative:  52 y.o. 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  Since that time patient has been placed on doxycycline due to purulent discharge from the area along with erythema.  Patient does not recall how long he has been on the doxycycline but at least for several days.  Patient's foot continue to get more red and swollen with purulent discharge from the area and dehiscence of the wound.  Patient denies any fevers.  Patient referred for admission for postop wound infection.  Dr. Sharol Given at [facility] has been called and made aware and advised patient can stay here at [facility] however we have no orthopedic surgery coverage here for several days.  Assessment & Plan:   Principal Problem:   Post op infection Active Problems:   Substance abuse ([place])   Cocaine abuse ([place])   Generalized anxiety disorder   Tobacco use disorder   Diabetic foot infection ([place])    Post op infection-failing outpatient doxycycline orally.   -Patient was started on empiric IV vancomycin and Zosyn.   -Patient with hx of noncompliance with his postop instructions from Dr. Sharol Given. -Appreciate input by Dr. Sharol Given.  Recommendations for continued IV antibiotics for 3 to 5 days. -Creatinine recently noted to have increased to 1.22 -Had since discontinued vancomycin and Zosyn and transitioned to oral doxycycline -Afebrile, stable  Active Problems:   Diabetic foot infection ([place]) -continue with sliding scale insulin -Glucose noted to be in the 200s.  Appreciate  input by diabetic coordinator. -Patient continued on pre-meal coverage  Substance abuse ([place]) -UDS pos for cocaine -Cessation done at bedside.  Cocaine abuse ([place]) -Per above, pos for cocaine on UDS. -Cocaine cessation was done at bedside.  Generalized anxiety disorder -Appears stable at present  Tobacco use disorder -Continue with nicotine patch as tolerated -Patient tolerating continue patch  Acute renal failure, new finding -Patient with creatinine of 1.2 in the setting of concurrent vancomycin and Zosyn -Discontinued above antibiotics as well as lisinopril -Patient was also continued on gentle IV fluid hydration -Basic metabolic panel reviewed, creatinine slightly improved to 1.21 -We will repeat basic metabolic panel in the morning  DVT prophylaxis: SCD's Code Status: Full Family Communication: Pt in room, family not at bedside Disposition Plan: Possible discharge home in 24 to 48 hours  Consultants:   Orthopedic Surgery (Dr. Sharol Given)  Procedures:     Antimicrobials: Anti-infectives (From admission, onward)   Start     Dose/Rate Route Frequency Ordered Stop   06/25/17 1430  doxycycline (VIBRA-TABS) tablet 100 mg     100 mg Oral Every 12 hours 06/25/17 1418     06/25/17 1000  vancomycin (VANCOCIN) IVPB 750 mg/150 ml premix  Status:  Discontinued     750 mg 150 mL/hr over 60 Minutes Intravenous Every 12 hours 06/24/17 2230 06/25/17 1321   06/22/17 2200  vancomycin (VANCOCIN) 1,500 mg in sodium chloride 0.9 % 500 mL IVPB  Status:  Discontinued     1,500 mg 250 mL/hr over 120 Minutes Intravenous Every 12 hours 06/22/17 1638 06/24/17 2230   06/22/17  2200  piperacillin-tazobactam (ZOSYN) IVPB 3.375 g  Status:  Discontinued     3.375 g 12.5 mL/hr over 240 Minutes Intravenous Every 8 hours 06/22/17 1638 06/25/17 1321   06/22/17 1445  vancomycin (VANCOCIN) IVPB 1000 mg/200 mL premix     1,000 mg 200 mL/hr over 60 Minutes Intravenous  Once 06/22/17 1444 06/22/17 1623    06/22/17 1430  piperacillin-tazobactam (ZOSYN) IVPB 3.375 g     3.375 g 100 mL/hr over 30 Minutes Intravenous  Once 06/22/17 1416 06/22/17 1511      Subjective: No complaints this morning  Objective: Vitals:   06/25/17 2046 06/25/17 2139 06/25/17 2337 06/26/17 0450  BP: 121/79 117/67 115/72 125/76  Pulse:  85 79 79  Resp:  17 18 18   Temp:  97.8 F (36.6 C) 98.2 F (36.8 C) 98.5 F (36.9 C)  TempSrc:  Axillary Oral Oral  SpO2:  97% 99% 100%  Weight:      Height:        Intake/Output Summary (Last 24 hours) at 06/26/2017 1816 Last data filed at 06/26/2017 0500 Gross per 24 hour  Intake 1165.83 ml  Output -  Net 1165.83 ml   Filed Weights   06/22/17 1349  Weight: 81.6 kg (180 lb)    Examination: General exam: Awake, laying in bed, in nad Respiratory system: Normal respiratory effort, no wheezing Cardiovascular system: regular rate, s1, s2 Gastrointestinal system: Soft, nondistended, positive BS Central nervous system: CN2-12 grossly intact, strength intact Extremities: Perfused, no clubbing Skin: Normal skin turgor, no notable skin lesions seen Psychiatry: Mood normal // no visual hallucinations   Data Reviewed: I have personally reviewed following labs and imaging studies  CBC: Recent Labs  Lab 06/22/17 1457  WBC 8.5  NEUTROABS 5.1  HGB 13.2  HCT 40.6  MCV 84.4  PLT 400   Basic Metabolic Panel: Recent Labs  Lab 06/22/17 1457 06/25/17 0803 06/26/17 0527  NA 138 140 140  K 4.0 4.2 4.4  CL 102 108 105  CO2 24 23 27   GLUCOSE 172* 255* 204*  BUN 12 13 14   CREATININE 0.72 1.22 1.21  CALCIUM 9.7 9.2 10.0   GFR: Estimated Creatinine Clearance: 80.6 mL/min (by C-G formula based on SCr of 1.21 mg/dL). Liver Function Tests: No results for input(s): AST, ALT, ALKPHOS, BILITOT, PROT, ALBUMIN in the last 168 hours. No results for input(s): LIPASE, AMYLASE in the last 168 hours. No results for input(s): AMMONIA in the last 168 hours. Coagulation  Profile: No results for input(s): INR, PROTIME in the last 168 hours. Cardiac Enzymes: No results for input(s): CKTOTAL, CKMB, CKMBINDEX, TROPONINI in the last 168 hours. BNP (last 3 results) No results for input(s): PROBNP in the last 8760 hours. HbA1C: No results for input(s): HGBA1C in the last 72 hours. CBG: Recent Labs  Lab 06/25/17 1641 06/25/17 2105 06/26/17 0750 06/26/17 1237 06/26/17 1702  GLUCAP 181* 152* 151* 223* 204*   Lipid Profile: No results for input(s): CHOL, HDL, LDLCALC, TRIG, CHOLHDL, LDLDIRECT in the last 72 hours. Thyroid Function Tests: No results for input(s): TSH, T4TOTAL, FREET4, T3FREE, THYROIDAB in the last 72 hours. Anemia Panel: No results for input(s): VITAMINB12, FOLATE, FERRITIN, TIBC, IRON, RETICCTPCT in the last 72 hours. Sepsis Labs: No results for input(s): PROCALCITON, LATICACIDVEN in the last 168 hours.  No results found for this or any previous visit (from the past 240 hour(s)).   Radiology Studies: No results found.  Scheduled Meds: . diltiazem  30 mg Oral BID  .  doxycycline  100 mg Oral Q12H  . insulin aspart  0-9 Units Subcutaneous TID WC  . insulin aspart  3 Units Subcutaneous TID WC  . nicotine  21 mg Transdermal Daily  . QUEtiapine  100 mg Oral QHS  . rosuvastatin  20 mg Oral q1800   Continuous Infusions:    LOS: 3 days   Marylu Lund, MD [facility] Pager [phone]  If 7PM-7AM, please contact night-coverage [email or web address] Password [facility] 06/26/2017, 6:16 PM

## 2017-06-27 LAB — BASIC METABOLIC PANEL
Anion gap: 11 (ref 5–15)
BUN: 20 mg/dL (ref 6–20)
CALCIUM: 9.5 mg/dL (ref 8.9–10.3)
CO2: 24 mmol/L (ref 22–32)
CREATININE: 1.32 mg/dL — AB (ref 0.61–1.24)
Chloride: 103 mmol/L (ref 101–111)
GFR calc Af Amer: >60 mL/min (ref >60)
GFR, EST NON AFRICAN AMERICAN: 60 mL/min — AB (ref >60)
GLUCOSE: 223 mg/dL — AB (ref 65–99)
Potassium: 4.2 mmol/L (ref 3.5–5.1)
Sodium: 138 mmol/L (ref 135–145)

## 2017-06-27 LAB — GLUCOSE, CAPILLARY
GLUCOSE-CAPILLARY: 114 mg/dL — AB (ref 65–99)
Glucose-Capillary: 228 mg/dL — ABNORMAL HIGH (ref 65–99)
Glucose-Capillary: 258 mg/dL — ABNORMAL HIGH (ref 65–99)
Glucose-Capillary: 172 mg/dL — ABNORMAL HIGH (ref 65–99)

## 2017-06-27 MED ORDER — LACTATED RINGERS IV SOLN
INTRAVENOUS | Status: DC
  Administered 2017-06-27: 11:00:00 via INTRAVENOUS

## 2017-06-27 NOTE — Progress Notes (Signed)
PROGRESS NOTE    [name]  PYP:[id number] DOB: 1965/11/28 DOA: 06/22/2017 PCP: Lucia Gaskins, MD    Brief Narrative:  52 y.o. 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  Since that time patient has been placed on doxycycline due to purulent discharge from the area along with erythema.  Patient does not recall how long he has been on the doxycycline but at least for several days.  Patient's foot continue to get more red and swollen with purulent discharge from the area and dehiscence of the wound.  Patient denies any fevers.  Patient referred for admission for postop wound infection.  Dr. Sharol Given at [facility] has been called and made aware and advised patient can stay here at [facility] however we have no orthopedic surgery coverage here for several days.  Assessment & Plan:   Principal Problem:   Post op infection Active Problems:   Substance abuse ([place])   Cocaine abuse ([place])   Generalized anxiety disorder   Tobacco use disorder   Diabetic foot infection ([place])    Post op infection-failing outpatient doxycycline orally.   -Patient was started on empiric IV vancomycin and Zosyn.   -Patient with hx of noncompliance with his postop instructions from Dr. Sharol Given. -Appreciate input by Dr. Sharol Given.  Recommendations for continued IV antibiotics for 3 to 5 days. -Creatinine recently noted to have increased to 1.22 -Had since discontinued vancomycin and Zosyn and transitioned to oral doxycycline -Mains afebrile and stable  Active Problems:   Diabetic foot infection ([place]) -continue with sliding scale insulin -Glucose noted to be in the 200s.   Appreciate input by diabetic coordinator. -Patient continued on pre-meal coverage needed  Substance abuse ([place]) -UDS pos for cocaine -Drug cessation was done at bedside  Cocaine abuse ([place]) -Per above, pos for cocaine on UDS. -Cocaine cessation was done at bedside.  Generalized anxiety disorder -Stable currently  Tobacco use disorder -Continue with nicotine patch as tolerated -Patient tolerating continue patch  Acute renal failure, new finding -Patient with creatinine of 1.2 in the setting of concurrent vancomycin and Zosyn -Discontinued above antibiotics as well as lisinopril -Patient was also continued on gentle IV fluid hydration -Basic metabolic panel reviewed, creatinine has worsened today to over 1.3 -Have resumed gentle IV fluid hydration -Will repeat basic metabolic panel in the morning -Patient with complaints of decreased urine output recently and feelings of incomplete emptying of bladder.  Will check bladder scan  DVT prophylaxis: SCD's Code Status: Full Family Communication: Pt in room, family not at bedside Disposition Plan: Possible discharge home in 24 to 48 hours pending improvement of renal function  Consultants:   Orthopedic Surgery (Dr. Sharol Given)  Procedures:     Antimicrobials: Anti-infectives (From admission, onward)   Start     Dose/Rate Route Frequency Ordered Stop   06/25/17 1430  doxycycline (VIBRA-TABS) tablet 100 mg     100 mg Oral Every 12 hours 06/25/17 1418     06/25/17 1000  vancomycin (VANCOCIN) IVPB 750 mg/150 ml premix  Status:  Discontinued     750 mg 150 mL/hr over 60 Minutes Intravenous Every 12 hours 06/24/17 2230 06/25/17 1321   06/22/17 2200  vancomycin (VANCOCIN) 1,500  mg in sodium chloride 0.9 % 500 mL IVPB  Status:  Discontinued     1,500 mg 250 mL/hr over 120 Minutes Intravenous Every 12 hours 06/22/17 1638 06/24/17 2230   06/22/17 2200  piperacillin-tazobactam (ZOSYN) IVPB 3.375 g  Status:  Discontinued     3.375 g 12.5  mL/hr over 240 Minutes Intravenous Every 8 hours 06/22/17 1638 06/25/17 1321   06/22/17 1445  vancomycin (VANCOCIN) IVPB 1000 mg/200 mL premix     1,000 mg 200 mL/hr over 60 Minutes Intravenous  Once 06/22/17 1444 06/22/17 1623   06/22/17 1430  piperacillin-tazobactam (ZOSYN) IVPB 3.375 g     3.375 g 100 mL/hr over 30 Minutes Intravenous  Once 06/22/17 1416 06/22/17 1511      Subjective: Denies chest pain or shortness of breath  Objective: Vitals:   06/26/17 0450 06/26/17 2128 06/27/17 0533 06/27/17 1501  BP: 125/76 124/73 109/65 114/73  Pulse: 79 90 89 86  Resp: 18 18 18    Temp: 98.5 F (36.9 C) 98.4 F (36.9 C) 98.6 F (37 C) 98.5 F (36.9 C)  TempSrc: Oral Oral Oral Oral  SpO2: 100% 98% 96% 97%  Weight:      Height:        Intake/Output Summary (Last 24 hours) at 06/27/2017 1847 Last data filed at 06/27/2017 0900 Gross per 24 hour  Intake 200 ml  Output -  Net 200 ml   Filed Weights   06/22/17 1349  Weight: 81.6 kg (180 lb)    Examination: General exam: Conversant, in no acute distress Respiratory system: normal chest rise, clear, no audible wheezing Cardiovascular system: regular rhythm, s1-s2 Gastrointestinal system: Nondistended, nontender, pos BS Central nervous system: No seizures, no tremors Extremities: No cyanosis, no joint deformities Skin: No rashes, no pallor Psychiatry: Affect normal // no auditory hallucinations   Data Reviewed: I have personally reviewed following labs and imaging studies  CBC: Recent Labs  Lab 06/22/17 1457  WBC 8.5  NEUTROABS 5.1  HGB 13.2  HCT 40.6  MCV 84.4  PLT 409   Basic Metabolic Panel: Recent Labs  Lab 06/22/17 1457 06/25/17 0803 06/26/17 0527 06/27/17 0710  NA 138 140 140 138  K 4.0 4.2 4.4 4.2  CL 102 108 105 103  CO2 24 23 27 24   GLUCOSE 172* 255* 204* 223*  BUN 12 13 14 20   CREATININE 0.72 1.22 1.21 1.32*  CALCIUM 9.7 9.2 10.0 9.5   GFR: Estimated Creatinine Clearance: 73.8 mL/min (A) (by C-G  formula based on SCr of 1.32 mg/dL (H)). Liver Function Tests: No results for input(s): AST, ALT, ALKPHOS, BILITOT, PROT, ALBUMIN in the last 168 hours. No results for input(s): LIPASE, AMYLASE in the last 168 hours. No results for input(s): AMMONIA in the last 168 hours. Coagulation Profile: No results for input(s): INR, PROTIME in the last 168 hours. Cardiac Enzymes: No results for input(s): CKTOTAL, CKMB, CKMBINDEX, TROPONINI in the last 168 hours. BNP (last 3 results) No results for input(s): PROBNP in the last 8760 hours. HbA1C: No results for input(s): HGBA1C in the last 72 hours. CBG: Recent Labs  Lab 06/26/17 1702 06/26/17 2158 06/27/17 0818 06/27/17 1236 06/27/17 1708  GLUCAP 204* 156* 228* 258* 114*   Lipid Profile: No results for input(s): CHOL, HDL, LDLCALC, TRIG, CHOLHDL, LDLDIRECT in the last 72 hours. Thyroid Function Tests: No results for input(s): TSH, T4TOTAL, FREET4, T3FREE, THYROIDAB in the last 72 hours. Anemia Panel: No results for input(s): VITAMINB12, FOLATE, FERRITIN, TIBC, IRON, RETICCTPCT in the  last 72 hours. Sepsis Labs: No results for input(s): PROCALCITON, LATICACIDVEN in the last 168 hours.  No results found for this or any previous visit (from the past 240 hour(s)).   Radiology Studies: No results found.  Scheduled Meds: . diltiazem  30 mg Oral BID  . doxycycline  100 mg Oral Q12H  . insulin aspart  0-9 Units Subcutaneous TID WC  . insulin aspart  3 Units Subcutaneous TID WC  . nicotine  21 mg Transdermal Daily  . QUEtiapine  100 mg Oral QHS  . rosuvastatin  20 mg Oral q1800   Continuous Infusions: . lactated ringers 75 mL/hr at 06/27/17 1047     LOS: 4 days   Marylu Lund, MD [facility] Pager [phone]  If 7PM-7AM, please contact night-coverage [email or web address] Password [facility] 06/27/2017, 6:47 PM

## 2017-06-28 DIAGNOSIS — T8149XA Infection following a procedure, other surgical site, initial encounter: Secondary | ICD-10-CM

## 2017-06-28 LAB — BASIC METABOLIC PANEL
ANION GAP: 5 (ref 5–15)
BUN: 18 mg/dL (ref 6–20)
CALCIUM: 9.3 mg/dL (ref 8.9–10.3)
CHLORIDE: 108 mmol/L (ref 101–111)
CO2: 28 mmol/L (ref 22–32)
CREATININE: 1.07 mg/dL (ref 0.61–1.24)
GFR calc Af Amer: >60 mL/min (ref >60)
GFR calc non Af Amer: >60 mL/min (ref >60)
GLUCOSE: 224 mg/dL — AB (ref 65–99)
Potassium: 4.2 mmol/L (ref 3.5–5.1)
Sodium: 141 mmol/L (ref 135–145)

## 2017-06-28 LAB — GLUCOSE, CAPILLARY
Glucose-Capillary: 179 mg/dL — ABNORMAL HIGH (ref 65–99)
Glucose-Capillary: 187 mg/dL — ABNORMAL HIGH (ref 65–99)

## 2017-06-28 MED ORDER — SACCHAROMYCES BOULARDII 250 MG PO CAPS: 250.0000 mg | ORAL_CAPSULE | Freq: Two times a day (BID) | ORAL | 0 refills | Status: DC

## 2017-06-28 MED ORDER — HYDROXYZINE HCL 10 MG PO TABS: 10.0000 mg | ORAL_TABLET | Freq: Four times a day (QID) | ORAL | 0 refills | Status: DC | PRN

## 2017-06-28 MED ORDER — DOXYCYCLINE HYCLATE 100 MG PO TABS: 100.0000 mg | ORAL_TABLET | Freq: Two times a day (BID) | ORAL | 0 refills | Status: DC

## 2017-06-28 MED ORDER — OXYCODONE HCL 5 MG PO TABS: 5.0000 mg | ORAL_TABLET | Freq: Four times a day (QID) | ORAL | 0 refills | Status: AC | PRN

## 2017-06-28 NOTE — Progress Notes (Signed)
Corinna Capra to be D/C'd  per MD order. Discussed with the patient and all questions fully answered.  VSS, Skin clean, dry and intact without evidence of skin break down, no evidence of skin tears noted.  IV catheter discontinued intact. Site without signs and symptoms of complications. Dressing and pressure applied.  An After Visit Summary was printed and given to the patient. Patient received prescription.  D/c education completed with patient/family including follow up instructions, medication list, d/c activities limitations if indicated, with other d/c instructions as indicated by MD - patient able to verbalize understanding, all questions fully answered.   Patient instructed to return to ED, call 911, or call MD for any changes in condition.   Patient to be escorted via [place], and D/C home via private auto.

## 2017-06-28 NOTE — Discharge Summary (Addendum)
Physician Discharge Summary  [name] TIW:[id number] DOB: 1965-09-13 DOA: 06/22/2017  PCP: Lucia Gaskins, MD  Admit date: 06/22/2017 Discharge date: 06/28/2017  Admitted From: Home Disposition:  Home  Recommendations for Outpatient Follow-up:  1. Follow up with PCP in 1-2 weeks 2. Follow up with Dr. Sharol Given in 1 week 3. Recommend repeat BMET in 1 week 4. Please note lisinopril was held given concerns of rising Cr. Consider resuming when renal function stays normal  Discharge Condition:Improved CODE STATUS:Full Diet recommendation: Diabetic   Brief/Interim Summary: 52 y.o.malewith medical history significant ofdiabetes, polysubstance abuse, anxiety, alcohol abuse status post right second toe amputation secondary to osteomyelitis and failing conservative measures by Dr.Dudaon Jun 09, 2017 at [facility] who had follow-up with orthopedic surgery office on 06/16/2017 at which point incidentally noted per note that his wound looked good at that time and not infected. Since that time patient has been placed on doxycycline due to purulent discharge from the area along with erythema. Patient does not recall how long he has been on the doxycycline but at least for several days. Patient's foot continue to get more red and swollen with purulent discharge from the area and dehiscence of the wound. Patient denies any fevers. Patient referred for admission for postop wound infection. Dr. Ninfa Meeker [facility] has been called and made aware and advised patient can stay here at [facility] however we have no orthopedic surgery coverage here for several days.  Post op infection-failing outpatient doxycycline orally.  -Patient was started on empiric IV vancomycin and Zosyn.  -Patient with hx of noncompliance with his postop instructions from Dr. Sharol Given. -Creatinine trended up mildly and discontinued vancomycin and Zosyn and transitioned to oral doxycycline -Remained afebrile and stable -OK for d/c per  Dr. Sharol Given  Active Problems: Diabetic foot infection ([place]) -continue with sliding scale insulin -Glucose noted to be in the 200s.  Appreciate input by diabetic coordinator. -Cont home meds on discharge  Substance abuse ([place]) -UDS pos for cocaine -Drug cessation was done at bedside  Cocaine abuse ([place]) -Per above, pos for cocaine on UDS. -Cocaine cessation was done at bedside.  Generalized anxiety disorder -Stable currently  Tobacco use disorder -Continue with nicotine patch as tolerated -Patient tolerating continue patch  Acute renal failure, new finding -Patient with creatinine of 1.2 in the setting of concurrent vancomycin and Zosyn -Discontinued above antibiotics as well as lisinopril -Patient was also continued on gentle IV fluid hydration -Basic metabolic panel reviewed, creatinine has worsened today to over 1.3 -Improved with gentle IVF -Consider repeat bmet in 1 week to ensure renal function normalizes   Discharge Diagnoses:  Principal Problem:   Post op infection Active Problems:   Substance abuse ([place])   Cocaine abuse ([place])   Generalized anxiety disorder   Tobacco use disorder   Diabetic foot infection ([place])    Discharge Instructions  Discharge Instructions    Touch down weight bearing   Complete by:  As directed    Laterality:  right   Extremity:  Lower     Allergies as of 06/28/2017      Reactions   Benadryl [diphenhydramine Hcl] Other (See Comments)   Leg spasms    Trazodone And Nefazodone Other (See Comments)   Leg spasms       Medication List    STOP taking these medications   lisinopril 20 MG tablet Commonly known as:  PRINIVIL,ZESTRIL     TAKE these medications   acetaminophen 325 MG tablet Commonly known as:  TYLENOL Take 2 tablets (650 mg total) by mouth every 6 (six) hours as needed for mild pain (or Fever >/= 101).   blood glucose meter kit and supplies Kit Dispense based on patient and insurance preference. Use up to  four times daily as directed. (FOR ICD-9 250.00, 250.01).   diltiazem 30 MG tablet Commonly known as:  CARDIZEM Take 1 tablet (30 mg total) by mouth 2 (two) times daily.   doxycycline 100 MG tablet Commonly known as:  VIBRA-TABS Take 1 tablet (100 mg total) by mouth every 12 (twelve) hours. What changed:  when to take this   FARXIGA 10 MG Tabs tablet Generic drug:  dapagliflozin propanediol Take 10 mg by mouth daily.   hydrOXYzine 10 MG tablet Commonly known as:  ATARAX/VISTARIL Take 1 tablet (10 mg total) by mouth every 6 (six) hours as needed for anxiety.   metFORMIN 500 MG tablet Commonly known as:  GLUCOPHAGE Take 1 tablet (500 mg total) by mouth 2 (two) times daily with a meal. What changed:  how much to take   oxyCODONE 5 MG immediate release tablet Commonly known as:  Oxy IR/ROXICODONE Take 1 tablet (5 mg total) by mouth every 6 (six) hours as needed for up to 7 days for moderate pain.   QUEtiapine 100 MG tablet Commonly known as:  SEROQUEL Take 100 mg by mouth at bedtime.   rosuvastatin 20 MG tablet Commonly known as:  CRESTOR Take 20 mg by mouth daily.   saccharomyces boulardii 250 MG capsule Commonly known as:  FLORASTOR Take 1 capsule (250 mg total) by mouth 2 (two) times daily.   vitamin C 500 MG tablet Commonly known as:  ASCORBIC ACID Take 500 mg by mouth daily.   Vitamin D3 5000 units Caps Take 10,000 Units by mouth daily.            Discharge Care Instructions  (From admission, onward)        Start     Ordered   06/28/17 0000  Touch down weight bearing    Question Answer Comment  Laterality right   Extremity Lower      06/28/17 0720     Follow-up Information    [name], MD Follow up in 2 week(s).   Specialty:  Orthopedic Surgery Contact information: [place] [address] [address] [facility].   Why:  home health PT  Contact information: [address] [place] [address] [phone]        Lucia Gaskins, MD. Schedule an appointment as soon as possible for a visit in 1 week(s).   Specialty:  Internal Medicine Contact information: [place] [address] [phone]          Allergies  Allergen Reactions  . Benadryl [Diphenhydramine Hcl] Other (See Comments)    Leg spasms   . Trazodone And Nefazodone Other (See Comments)    Leg spasms     Consultations:  Orthopedic Surgery  Procedures/Studies: Mr Foot Right Wo Contrast  Result Date: 06/22/2017 CLINICAL DATA:  Diabetic foot ulcer.  Evaluate for osteomyelitis. EXAM: MRI OF THE RIGHT FOREFOOT WITHOUT CONTRAST TECHNIQUE: Multiplanar, multisequence MR imaging of the right forefoot was performed. No intravenous contrast was administered. COMPARISON:  Right foot x-rays from same day. FINDINGS: Bones/Joint/Cartilage Postsurgical changes related to prior second toe amputation at the MTP joint. Again seen is a mildly displaced subacute fracture of  the third metacarpal neck. Postsurgical changes related to prior first metatarsal base osteotomy. There is marrow edema involving the base and shaft of the first metatarsal, base and proximal shaft of the second metatarsal, base of the third metatarsal, and all 3 cuneiforms. There is loss of the normal T1 marrow signal at the base of the first metatarsal, and in the medial cuneiform. There is cortical loss along the distal articulating aspect of the medial cuneiform. Mild osteoarthritis of the first MTP joint.  No joint effusion. Ligaments Collateral ligaments are intact. Muscles and Tendons Flexor, peroneal and extensor compartment tendons are intact. Diffuse fatty atrophy of the intrinsic muscles of the forefoot. Soft tissue Small amount of fluid at the tip of the second metatarsal head without discrete fluid collection. No hematoma. No soft tissue mass. Mild soft tissue edema over the dorsal forefoot. Small amount of  fluid in the third intermetatarsal bursa. IMPRESSION: 1. Prior second toe amputation at the MTP joint with small amount of fluid at the tip of the second metatarsal head. No discrete fluid collection or second metatarsal osteomyelitis. 2. Postsurgical changes at the base of the first metatarsal and medial cuneiform with prominent marrow edema and loss of the normal T1 signal involving the base of the first metatarsal and medial cuneiform. Given recent surgery at this site and loss of the normal medial cuneiform cortex, findings are concerning for osteomyelitis. Neuropathic arthropathy could have a similar appearance. 3. Additional marrow edema with preserved T1 marrow signal involving the first metatarsal shaft, bases of the second and third metatarsals, and middle and lateral cuneiforms could reflect neuropathic arthropathy and/or stress related changes. 4. Subacute, mildly displaced fracture of the third metatarsal neck. Electronically Signed   By: Titus Dubin M.D.   On: 06/22/2017 18:36   Dg Foot Complete Right  Result Date: 06/22/2017 CLINICAL DATA:  Swelling and pain post amputation EXAM: RIGHT FOOT COMPLETE - 3+ VIEW COMPARISON:  06/04/2017 FINDINGS: Status post amputation of the 2nd digit at the MTP joint. Fracture of the 3rd metacarpal head/neck, mildly displaced, new. Marginal erosion at the medial base of the 1st proximal phalanx, unchanged. Postsurgical changes involving the base of the 1st metatarsal. Degenerative changes at the 1st tarsometatarsal joint. Maturing bone formation at the 1st metatarsal osteotomy site. Mild soft tissue swelling in the forefoot, centered at the surgical bed. IMPRESSION: Status post amputation of the 2nd digit at the MTP joint. Fracture of the 3rd metacarpal head/neck, mildly displace, new from the prior. Mild soft tissue swelling in the forefoot. Additional stable degenerative and postsurgical changes, as described above. Electronically Signed   By: Julian Hy  M.D.   On: 06/22/2017 15:52   Dg Foot Complete Right  Result Date: 06/04/2017 CLINICAL DATA:  Right second toe wound, rule out osteomyelitis. EXAM: RIGHT FOOT COMPLETE - 3+ VIEW COMPARISON:  01/08/2017 FINDINGS: Interval osteotomy and staple fixation at the base of the first metatarsal. Extensive soft tissue swelling of the second toe. Deep ulcer extending down to the distal phalanx. No evidence of osteomyelitis. IMPRESSION: Soft tissue swelling of the second toe without osteomyelitis. Electronically Signed   By: [name] Gallo M.D.   On: 06/04/2017 19:01    Subjective: Eager to go home  Discharge Exam: Vitals:   06/27/17 1501 06/27/17 2113  BP: 114/73 (!) 141/95  Pulse: 86 97  Resp:  16  Temp: 98.5 F (36.9 C) 98.2 F (36.8 C)  SpO2: 97% 99%   Vitals:   06/26/17 2128 06/27/17 0533 06/27/17  1501 06/27/17 2113  BP: 124/73 109/65 114/73 (!) 141/95  Pulse: 90 89 86 97  Resp: 18 18  16   Temp: 98.4 F (36.9 C) 98.6 F (37 C) 98.5 F (36.9 C) 98.2 F (36.8 C)  TempSrc: Oral Oral Oral Oral  SpO2: 98% 96% 97% 99%  Weight:      Height:        General: Pt is alert, awake, not in acute distress Cardiovascular: RRR, S1/S2 +, no rubs, no gallops Respiratory: CTA bilaterally, no wheezing, no rhonchi Abdominal: Soft, NT, ND, bowel sounds + Extremities: no edema, no cyanosis   The results of significant diagnostics from this hospitalization (including imaging, microbiology, ancillary and laboratory) are listed below for reference.     Microbiology: No results found for this or any previous visit (from the past 240 hour(s)).   Labs: BNP (last 3 results) No results for input(s): BNP in the last 8760 hours. Basic Metabolic Panel: Recent Labs  Lab 06/22/17 1457 06/25/17 0803 06/26/17 0527 06/27/17 0710 06/28/17 0631  NA 138 140 140 138 141  K 4.0 4.2 4.4 4.2 4.2  CL 102 108 105 103 108  CO2 24 23 27 24 28   GLUCOSE 172* 255* 204* 223* 224*  BUN 12 13 14 20 18   CREATININE  0.72 1.22 1.21 1.32* 1.07  CALCIUM 9.7 9.2 10.0 9.5 9.3   Liver Function Tests: No results for input(s): AST, ALT, ALKPHOS, BILITOT, PROT, ALBUMIN in the last 168 hours. No results for input(s): LIPASE, AMYLASE in the last 168 hours. No results for input(s): AMMONIA in the last 168 hours. CBC: Recent Labs  Lab 06/22/17 1457  WBC 8.5  NEUTROABS 5.1  HGB 13.2  HCT 40.6  MCV 84.4  PLT 280   Cardiac Enzymes: No results for input(s): CKTOTAL, CKMB, CKMBINDEX, TROPONINI in the last 168 hours. BNP: Invalid input(s): POCBNP CBG: Recent Labs  Lab 06/27/17 0818 06/27/17 1236 06/27/17 1708 06/27/17 2110 06/28/17 0827  GLUCAP 228* 258* 114* 172* 179*   D-Dimer No results for input(s): DDIMER in the last 72 hours. Hgb A1c No results for input(s): HGBA1C in the last 72 hours. Lipid Profile No results for input(s): CHOL, HDL, LDLCALC, TRIG, CHOLHDL, LDLDIRECT in the last 72 hours. Thyroid function studies No results for input(s): TSH, T4TOTAL, T3FREE, THYROIDAB in the last 72 hours.  Invalid input(s): FREET3 Anemia work up No results for input(s): VITAMINB12, FOLATE, FERRITIN, TIBC, IRON, RETICCTPCT in the last 72 hours. Urinalysis    Component Value Date/Time   COLORURINE STRAW (A) 09/04/2016 0741   APPEARANCEUR CLEAR 09/04/2016 0741   LABSPEC 1.001 (L) 09/04/2016 0741   PHURINE 6.0 09/04/2016 0741   GLUCOSEU >=500 (A) 09/04/2016 0741   HGBUR NEGATIVE 09/04/2016 0741   BILIRUBINUR NEGATIVE 09/04/2016 0741   KETONESUR 5 (A) 09/04/2016 0741   PROTEINUR NEGATIVE 09/04/2016 0741   UROBILINOGEN 0.2 12/18/2013 1402   NITRITE NEGATIVE 09/04/2016 0741   LEUKOCYTESUR NEGATIVE 09/04/2016 0741   Sepsis Labs Invalid input(s): PROCALCITONIN,  WBC,  LACTICIDVEN Microbiology No results found for this or any previous visit (from the past 240 hour(s)).  Time spent 52mn  SIGNED:   SMarylu Lund MD  [facility] 06/28/2017, 10:16 AM  If 7PM-7AM, please contact  night-coverage [email or web address] Password [email or web address]

## 2017-06-28 NOTE — Plan of Care (Signed)
  Problem: Education: Goal: Knowledge of General Education information will improve Outcome: Progressing   Problem: Clinical Measurements: Goal: Ability to maintain clinical measurements within normal limits will improve Outcome: Progressing Goal: Respiratory complications will improve Outcome: Progressing Goal: Cardiovascular complication will be avoided Outcome: Progressing   Problem: Activity: Goal: Risk for activity intolerance will decrease Outcome: Progressing   Problem: Nutrition: Goal: Adequate nutrition will be maintained Outcome: Progressing

## 2017-06-28 NOTE — Progress Notes (Signed)
Patient ID: [name], male   DOB: [birth year], 52 y.o.   MRN: [id number]    Patient seen in follow-up status post second ray amputation right foot.  The cellulitis and maceration has completely resolved.  There is no tenderness to palpation patient does have a scab over the distal incision there is no drainage no signs of infection.  Patient has developed a little blister on the fourth toe from weightbearing in the hospital.  Discussed the importance of minimizing his weightbearing on his foot.  Patient was up ambulating in the room without any thing other than a sock on his foot.  Anticipate discharge on oral antibiotics I will follow-up in the office in 1 week to remove the sutures.

## 2017-07-01 ENCOUNTER — Encounter (INDEPENDENT_AMBULATORY_CARE_PROVIDER_SITE_OTHER): Payer: Self-pay | Admitting: Orthopedic Surgery

## 2017-07-01 ENCOUNTER — Ambulatory Visit (INDEPENDENT_AMBULATORY_CARE_PROVIDER_SITE_OTHER): Payer: Medicare Other | Admitting: Orthopedic Surgery

## 2017-07-01 DIAGNOSIS — L97521 Non-pressure chronic ulcer of other part of left foot limited to breakdown of skin: Secondary | ICD-10-CM | POA: Diagnosis not present

## 2017-07-01 DIAGNOSIS — M869 Osteomyelitis, unspecified: Secondary | ICD-10-CM | POA: Diagnosis not present

## 2017-07-01 MED ORDER — OXYCODONE-ACETAMINOPHEN 5-325 MG PO TABS: 1.0000 | ORAL_TABLET | Freq: Three times a day (TID) | ORAL | 0 refills | Status: DC | PRN

## 2017-07-01 NOTE — Progress Notes (Signed)
Office Visit Note   Patient: [name]           Date of Birth: December 02, 1965           MRN: [id number] Visit Date: 07/01/2017              Requested by: Lucia Gaskins, MD [address] [place], [place] [address] PCP: Lucia Gaskins, MD  Chief Complaint  Patient presents with  . Right Foot - Routine Post Op  . Left Foot - Wound Check      HPI: Patient is a 27 gentleman who presents for 2 separate issues #1 status post right foot second toe amputation recently hospitalized for cellulitis of the right foot #2 Waggoner grade 1 ulcer first metatarsal head left foot.  Assessment & Plan: Visit Diagnoses:  1. Toe osteomyelitis, right ([place])   2. Ulcer of left foot, limited to breakdown of skin ([place])     Plan: We will make some felt relieving donuts to unload pressure beneath the first metatarsal head left foot.  We will advance to regular shoewear for the right foot.  Follow-Up Instructions: Return in about 2 weeks (around 07/15/2017).   Ortho Exam  Patient is alert, oriented, no adenopathy, well-dressed, normal affect, normal respiratory effort. Examination patient's both feet have palpable pulses.  He has a Waggoner grade 1 ulcer beneath the first metatarsal head left foot.  After informed consent a 10 blade knife was used to debride the skin and soft tissue back to healthy viable granulation tissue.  The ulcer is 10 mm in diameter 1 mm deep this does not probe to bone or tendon there is no cellulitis or drainage or odor.  Examination right foot the surgical incision is well-healed.  There is no cellulitis no odor no drainage no signs of infection.  Imaging: No results found. No images are attached to the encounter.  Labs: Lab Results  Component Value Date   HGBA1C 6.7 (H) 06/07/2017   HGBA1C 10.3 (H) 08/17/2016   HGBA1C 7.7 (H) 12/18/2013   ESRSEDRATE 5 06/04/2017   ESRSEDRATE 110 (H) 12/19/2013   ESRSEDRATE 104 (H) 12/18/2013   CRP <0.8 06/05/2017   CRP 12.2 (H)  12/19/2013   CRP 14.9 (H) 12/18/2013   REPTSTATUS 12/24/2013 FINAL 12/18/2013   REPTSTATUS 12/24/2013 FINAL 12/18/2013   GRAMSTAIN  04/19/2011    WBC PRESENT,BOTH PMN AND MONONUCLEAR NO ORGANISMS SEEN   CULT  12/18/2013    NO GROWTH 5 DAYS Performed at [facility]  12/18/2013    NO GROWTH 5 DAYS Performed at [facility]      Lab Results  Component Value Date   ALBUMIN 3.8 06/05/2017   ALBUMIN 4.3 12/14/2016   ALBUMIN 3.7 09/19/2016    There is no height or weight on file to calculate BMI.  Orders:  No orders of the defined types were placed in this encounter.  No orders of the defined types were placed in this encounter.    Procedures: No procedures performed  Clinical Data: No additional findings.  ROS:  All other systems negative, except as noted in the HPI. Review of Systems  Objective: Vital Signs: There were no vitals taken for this visit.  Specialty Comments:  No specialty comments available.  PMFS History: Patient Active Problem List   Diagnosis Date Noted  . Diabetic foot infection ([place]) 06/22/2017  . Post op infection 06/22/2017  . Chronic osteomyelitis of toe, right ([place])   . Toe osteomyelitis,  right ([place]) 06/05/2017  . Right second toe ulcer ([place]) 06/05/2017  . Chronic ulcer of right foot limited to breakdown of skin ([place])   . SVT (supraventricular tachycardia) ([place]) 08/17/2016  . Tobacco use disorder 10/25/2014  . Diabetes mellitus type 2 in nonobese (HCC)   . Swelling   . 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 Diabetes mellitus due to underlying condition with foot ulcer ([place]) 12/18/2013  . Left leg cellulitis 12/18/2013  . Cellulitis of right lower extremity 07/19/2013  . Cocaine abuse ([place]) 03/29/2012  . Generalized anxiety disorder 03/29/2012  . Panic attacks 03/29/2012  . Alcohol dependence ([place]) 08/05/2011    Class: Chronic  . Substance abuse ([place]) 05/31/2011  .  Ulcer of left foot, limited to breakdown of skin ([place]) 02/15/2011  . Neuropathic ulcer of foot ([place]) 02/15/2011   Past Medical History:  Diagnosis Date  . Anxiety   . Arthritis    "everywhere" (04/21/2012)  . Cellulitis and abscess of foot 12/19/2013   LEFT FOOT  . Chronic pain   . DDD (degenerative disc disease)   . Depression   . Diabetes mellitus without complication (HCC)    borderline  . Diabetic foot ulcer ([place])   . ETOH abuse   . GERD (gastroesophageal reflux disease)    tums  . History of blood transfusion    "related to left knee OR; probably right hip too" (04/21/2012)  . Mental disorder   . Neuromuscular disorder (HCC)    neuropathy  . Noncompliance   . Open wound    bottom of foot  . Pneumonia ~ 2012  . Polysubstance abuse (HCC)    etoh, cocaine, heroin  . Stroke [facility]) 2008   "they said I might have had one during right hip replacement" (04/21/2012)    Family History  Problem Relation Age of Onset  . Diabetes Mother   . Diabetes Father     Past Surgical History:  Procedure Laterality Date  . AMPUTATION Right 06/09/2017   Procedure: RIGHT 2ND TOE AMPUTATION;  Surgeon: [name], MD;  Location: [facility];  Service: Orthopedics;  Laterality: Right;  . JOINT REPLACEMENT    . KNEE ARTHROSCOPY Bilateral 1980's/1990's  . LUNG LOBECTOMY Left ~ 2006  . LUNG LOBECTOMY    . METATARSAL OSTEOTOMY  10/29/2011   Procedure: METATARSAL OSTEOTOMY;  Surgeon: [name], MD;  Location: [facility];  Service: Orthopedics;  Laterality: Left;  Left 1st Metatarsal Dorsal Closing Wedge   . METATARSAL OSTEOTOMY Right 04/28/2017   Procedure: RIGHT 1ST METATARSAL DORSAL CLOSING WEDGE OSTEOTOMY;  Surgeon: [name], MD;  Location: [facility];  Service: Orthopedics;  Laterality: Right;  . REVISION TOTAL HIP ARTHROPLASTY Right 2008   "4-5 months after replacement" (04/21/2012)  . TOTAL HIP ARTHROPLASTY Right 2008  . TOTAL KNEE ARTHROPLASTY Left 2006  . TOTAL KNEE ARTHROPLASTY Right 04/20/2012    . TOTAL KNEE ARTHROPLASTY Right 04/20/2012   Procedure: TOTAL KNEE ARTHROPLASTY;  Surgeon: [name], MD;  Location: [facility];  Service: Orthopedics;  Laterality: Right;  Right Total Knee Arthroplasty   Social History   Occupational History  . Not on file  Tobacco Use  . Smoking status: Current Every Day Smoker    Packs/day: 0.50    Years: 30.00    Pack years: 15.00    Types: Cigarettes  . Smokeless tobacco: Never Used  Substance and Sexual Activity  . Alcohol use: No    Alcohol/week: 0.0  oz    Frequency: Never  . Drug use: Yes    Types: Cocaine    Comment: Cocaine and heroin   . Sexual activity: Yes    Birth control/protection: None

## 2017-07-07 ENCOUNTER — Other Ambulatory Visit (INDEPENDENT_AMBULATORY_CARE_PROVIDER_SITE_OTHER): Payer: Self-pay

## 2017-07-07 ENCOUNTER — Ambulatory Visit (INDEPENDENT_AMBULATORY_CARE_PROVIDER_SITE_OTHER): Payer: Medicare Other | Admitting: Family

## 2017-07-07 ENCOUNTER — Encounter (INDEPENDENT_AMBULATORY_CARE_PROVIDER_SITE_OTHER): Payer: Self-pay | Admitting: Family

## 2017-07-07 ENCOUNTER — Telehealth (INDEPENDENT_AMBULATORY_CARE_PROVIDER_SITE_OTHER): Payer: Self-pay

## 2017-07-07 VITALS — Ht 75.0 in | Wt 180.0 lb

## 2017-07-07 DIAGNOSIS — E08621 Diabetes mellitus due to underlying condition with foot ulcer: Secondary | ICD-10-CM | POA: Diagnosis not present

## 2017-07-07 DIAGNOSIS — L97521 Non-pressure chronic ulcer of other part of left foot limited to breakdown of skin: Secondary | ICD-10-CM

## 2017-07-07 DIAGNOSIS — L97509 Non-pressure chronic ulcer of other part of unspecified foot with unspecified severity: Secondary | ICD-10-CM

## 2017-07-07 DIAGNOSIS — L97511 Non-pressure chronic ulcer of other part of right foot limited to breakdown of skin: Secondary | ICD-10-CM

## 2017-07-07 DIAGNOSIS — Z794 Long term (current) use of insulin: Secondary | ICD-10-CM | POA: Diagnosis not present

## 2017-07-07 MED ORDER — SULFAMETHOXAZOLE-TRIMETHOPRIM 800-160 MG PO TABS: 1.0000 | ORAL_TABLET | Freq: Two times a day (BID) | ORAL | 0 refills | Status: DC

## 2017-07-07 MED ORDER — OXYCODONE-ACETAMINOPHEN 5-325 MG PO TABS: 1.0000 | ORAL_TABLET | Freq: Two times a day (BID) | ORAL | 0 refills | Status: DC | PRN

## 2017-07-07 NOTE — Telephone Encounter (Signed)
Pt came into office to request rx to hand deliver to pharmacy. This was printed and given to pt.

## 2017-07-07 NOTE — Telephone Encounter (Signed)
Pt at pharmacy ([facility]) now trying to pick up his Bactrim and they say they don't have it. Left here earlier and was told it would be sent in.

## 2017-07-13 ENCOUNTER — Encounter (INDEPENDENT_AMBULATORY_CARE_PROVIDER_SITE_OTHER): Payer: Self-pay | Admitting: Family

## 2017-07-13 NOTE — Progress Notes (Signed)
Office Visit Note   Patient: [name]           Date of Birth: [birth year]           MRN: [id number] Visit Date: 07/07/2017              Requested by: Lucia Gaskins, MD [address] [place], [place] [address] PCP: Lucia Gaskins, MD  Chief Complaint  Patient presents with  . Right Foot - Wound Check    4th toe ulcer new finding began 2 days ago.       HPI: Patient is a 42 gentleman who presents for 3 separate issues #1 status post right foot second toe amputation recently hospitalized for cellulitis of the right foot #2 Wagner grade 1 ulcer first metatarsal head left foot.  Now with redness and swelling to the tip of his fourth toe concerned for drainage, states is been bloody for the last 2 days.  Today has no dressing on this is ambulating in a postop shoe.  Assessment & Plan: Visit Diagnoses:  1. Ulcer of left foot, limited to breakdown of skin ([place])   2. Diabetes mellitus due to underlying condition with foot ulcer, with long-term current use of insulin ([place])   3. Chronic ulcer of right foot limited to breakdown of skin (HCC)     Plan: Continue to wear the felt relieving donuts to unload pressure beneath the first metatarsal head left foot.  Closely monitor the right fourth toe.  Will provide prescription for oral antibiotics.  We will see him back in the office next week.  Discussed return precautions.  Will cleanse ulcers daily with Dial soap and apply mupirocin dressings.  Declined to increase his narcotic prescription.  Follow-Up Instructions: Return in about 2 weeks (around 07/21/2017).   Ortho Exam  Patient is alert, oriented, no adenopathy, well-dressed, normal affect, normal respiratory effort. Examination patient's both feet have palpable pulses.  He has a Wagner grade 1 ulcer beneath the first metatarsal head left foot.  After informed consent a 10 blade knife was used to debride the skin and soft tissue back to healthy viable granulation tissue.  The  ulcer is 10 mm in diameter 1 mm deep this does not probe to bone or tendon there is no cellulitis or drainage or odor.  Examination right foot the surgical incision is well-healed.  There is no cellulitis no odor no drainage no signs of infection.  There is swelling to the distal phalanx of the fourth toe.  There is ecchymosis no palpable abscess no drainage today.  Ulceration does not probe to bone or tendon ulceration to the distal tip.  There is no odor no sausage digit swelling  Imaging: No results found. No images are attached to the encounter.  Labs: Lab Results  Component Value Date   HGBA1C 6.7 (H) 06/07/2017   HGBA1C 10.3 (H) 08/17/2016   HGBA1C 7.7 (H) 12/18/2013   ESRSEDRATE 5 06/04/2017   ESRSEDRATE 110 (H) 12/19/2013   ESRSEDRATE 104 (H) 12/18/2013   CRP <0.8 06/05/2017   CRP 12.2 (H) 12/19/2013   CRP 14.9 (H) 12/18/2013   REPTSTATUS 12/24/2013 FINAL 12/18/2013   REPTSTATUS 12/24/2013 FINAL 12/18/2013   GRAMSTAIN  04/19/2011    WBC PRESENT,BOTH PMN AND MONONUCLEAR NO ORGANISMS SEEN   CULT  12/18/2013    NO GROWTH 5 DAYS Performed at [facility]  12/18/2013    NO GROWTH 5 DAYS Performed at [facility]  Lab Results  Component Value Date   ALBUMIN 3.8 06/05/2017   ALBUMIN 4.3 12/14/2016   ALBUMIN 3.7 09/19/2016    Body mass index is 22.5 kg/m.  Orders:  No orders of the defined types were placed in this encounter.  Meds ordered this encounter  Medications  . oxyCODONE-acetaminophen (PERCOCET/ROXICET) 5-325 MG tablet    Sig: Take 1 tablet by mouth 2 (two) times daily as needed.    Dispense:  14 tablet    Refill:  0  . DISCONTD: sulfamethoxazole-trimethoprim (BACTRIM DS,SEPTRA DS) 800-160 MG tablet    Sig: Take 1 tablet by mouth 2 (two) times daily.    Dispense:  20 tablet    Refill:  0  . DISCONTD: sulfamethoxazole-trimethoprim (BACTRIM DS,SEPTRA DS) 800-160 MG tablet    Sig: Take 1 tablet by mouth 2 (two) times daily.      Dispense:  20 tablet    Refill:  0     Procedures: No procedures performed  Clinical Data: No additional findings.  ROS:  All other systems negative, except as noted in the HPI. Review of Systems  Constitutional: Negative for chills and fever.  Skin: Positive for wound.    Objective: Vital Signs: Ht 6\' 3"  (1.905 m)   Wt 180 lb (81.6 kg)   BMI 22.50 kg/m   Specialty Comments:  No specialty comments available.  PMFS History: Patient Active Problem List   Diagnosis Date Noted  . Diabetic foot infection ([place]) 06/22/2017  . Post op infection 06/22/2017  . Chronic osteomyelitis of toe, right ([place])   . Toe osteomyelitis, right ([place]) 06/05/2017  . Right second toe ulcer ([place]) 06/05/2017  . Chronic ulcer of right foot limited to breakdown of skin ([place])   . SVT (supraventricular tachycardia) ([place]) 08/17/2016  . Tobacco use disorder 10/25/2014  . Diabetes mellitus type 2 in nonobese (HCC)   . Swelling   . 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 Diabetes mellitus due to underlying condition with foot ulcer ([place]) 12/18/2013  . Left leg cellulitis 12/18/2013  . Cellulitis of right lower extremity 07/19/2013  . Cocaine abuse ([place]) 03/29/2012  . Generalized anxiety disorder 03/29/2012  . Panic attacks 03/29/2012  . Alcohol dependence ([place]) 08/05/2011    Class: Chronic  . Substance abuse ([place]) 05/31/2011  . Ulcer of left foot, limited to breakdown of skin ([place]) 02/15/2011  . Neuropathic ulcer of foot ([place]) 02/15/2011   Past Medical History:  Diagnosis Date  . Anxiety   . Arthritis    "everywhere" (04/21/2012)  . Cellulitis and abscess of foot 12/19/2013   LEFT FOOT  . Chronic pain   . DDD (degenerative disc disease)   . Depression   . Diabetes mellitus without complication (HCC)    borderline  . Diabetic foot ulcer ([place])   . ETOH abuse   . GERD (gastroesophageal reflux disease)    tums  . History of blood transfusion     "related to left knee OR; probably right hip too" (04/21/2012)  . Mental disorder   . Neuromuscular disorder (HCC)    neuropathy  . Noncompliance   . Open wound    bottom of foot  . Pneumonia ~ 2012  . Polysubstance abuse (HCC)    etoh, cocaine, heroin  . Stroke [facility]) 2008   "they said I might have had one during right hip replacement" (04/21/2012)    Family History  Problem Relation Age of Onset  . Diabetes Mother   .  Diabetes Father     Past Surgical History:  Procedure Laterality Date  . AMPUTATION Right 06/09/2017   Procedure: RIGHT 2ND TOE AMPUTATION;  Surgeon: [name], MD;  Location: [facility];  Service: Orthopedics;  Laterality: Right;  . JOINT REPLACEMENT    . KNEE ARTHROSCOPY Bilateral 1980's/1990's  . LUNG LOBECTOMY Left ~ 2006  . LUNG LOBECTOMY    . METATARSAL OSTEOTOMY  10/29/2011   Procedure: METATARSAL OSTEOTOMY;  Surgeon: [name], MD;  Location: [facility];  Service: Orthopedics;  Laterality: Left;  Left 1st Metatarsal Dorsal Closing Wedge   . METATARSAL OSTEOTOMY Right 04/28/2017   Procedure: RIGHT 1ST METATARSAL DORSAL CLOSING WEDGE OSTEOTOMY;  Surgeon: [name], MD;  Location: [facility];  Service: Orthopedics;  Laterality: Right;  . REVISION TOTAL HIP ARTHROPLASTY Right 2008   "4-5 months after replacement" (04/21/2012)  . TOTAL HIP ARTHROPLASTY Right 2008  . TOTAL KNEE ARTHROPLASTY Left 2006  . TOTAL KNEE ARTHROPLASTY Right 04/20/2012  . TOTAL KNEE ARTHROPLASTY Right 04/20/2012   Procedure: TOTAL KNEE ARTHROPLASTY;  Surgeon: [name], MD;  Location: [facility];  Service: Orthopedics;  Laterality: Right;  Right Total Knee Arthroplasty   Social History   Occupational History  . Not on file  Tobacco Use  . Smoking status: Current Every Day Smoker    Packs/day: 0.50    Years: 30.00    Pack years: 15.00    Types: Cigarettes  . Smokeless tobacco: Never Used  Substance and Sexual Activity  . Alcohol use: No    Alcohol/week: 0.0 oz    Frequency: Never  . Drug  use: Yes    Types: Cocaine    Comment: Cocaine and heroin   . Sexual activity: Yes    Birth control/protection: None

## 2017-07-14 ENCOUNTER — Telehealth (INDEPENDENT_AMBULATORY_CARE_PROVIDER_SITE_OTHER): Payer: Self-pay | Admitting: Orthopedic Surgery

## 2017-07-14 NOTE — Telephone Encounter (Signed)
Will hold. Pt has an appt with Dr. Sharol Given tomorrow. Will call with update.

## 2017-07-14 NOTE — Telephone Encounter (Signed)
[facility]  [phone]   One more visit for next week for possible discharge     Pt toe isn't healing well ben is very concerned

## 2017-07-16 ENCOUNTER — Ambulatory Visit (INDEPENDENT_AMBULATORY_CARE_PROVIDER_SITE_OTHER): Payer: Medicare Other | Admitting: Orthopedic Surgery

## 2017-07-16 ENCOUNTER — Encounter (INDEPENDENT_AMBULATORY_CARE_PROVIDER_SITE_OTHER): Payer: Self-pay | Admitting: Orthopedic Surgery

## 2017-07-16 ENCOUNTER — Other Ambulatory Visit (INDEPENDENT_AMBULATORY_CARE_PROVIDER_SITE_OTHER): Payer: Self-pay | Admitting: Orthopedic Surgery

## 2017-07-16 VITALS — Ht 75.0 in | Wt 180.0 lb

## 2017-07-16 DIAGNOSIS — M869 Osteomyelitis, unspecified: Secondary | ICD-10-CM

## 2017-07-16 DIAGNOSIS — L97521 Non-pressure chronic ulcer of other part of left foot limited to breakdown of skin: Secondary | ICD-10-CM | POA: Diagnosis not present

## 2017-07-16 MED ORDER — OXYCODONE-ACETAMINOPHEN 5-325 MG PO TABS: 1.0000 | ORAL_TABLET | Freq: Three times a day (TID) | ORAL | 0 refills | Status: DC | PRN

## 2017-07-16 NOTE — Progress Notes (Signed)
Office Visit Note   Patient: [name]           Date of Birth: Jan 09, 1966           MRN: [id number] Visit Date: 07/16/2017              Requested by: Lucia Gaskins, MD [address] [place], [place] [address] PCP: Lucia Gaskins, MD  Chief Complaint  Patient presents with  . Right Foot - Routine Post Op    06/04/17 right second toe amp. 4th toe discolored, ulcer and swollen       HPI: Patient is a 52 year old gentleman who was seen in follow-up for both feet.  He has a Waggoner grade 1 ulcer beneath the first metatarsal head of the left foot cellulitis swelling ulceration right foot fourth toe status post second toe amputation right foot.  Assessment & Plan: Visit Diagnoses:  1. Ulcer of left foot, limited to breakdown of skin ([place])   2. Toe osteomyelitis, right ([place])     Plan: Patient will need to proceed with an amputation of the right foot fourth toe we will also need to get him custom orthotics for his shoes we will set this up once he is healed from this way to his surgery.  He will continue wearing the felt relieving donut in the left foot.  Prescription provided for Percocet.  Follow-Up Instructions: Return in about 2 weeks (around 07/30/2017).   Ortho Exam  Patient is alert, oriented, no adenopathy, well-dressed, normal affect, normal respiratory effort. Examination patient has palpable pulses he has a Waggoner grade 1 ulcer beneath the first metatarsal head of the left foot.  After informed consent a 10 blade knife was used to debride the skin and soft tissue back to healthy viable granulation tissue this was touched with silver nitrate the ulcer is 2 cm in diameter 2 mm deep this does not probe to bone or tendon.  Semination the right foot the cellulitis and swelling is resolving and the fourth toe he will complete his course of Bactrim DS he is starting to get a GI upset from the oral antibiotics.  The right foot has black changes to the dorsum of the fourth  toe with an ulcer that probes to bone consistent with osteomyelitis of the fourth toe there is some mild redness in the foot but no cellulitis no tenderness no signs of infection in the foot.  The skin wrinkles well.  Imaging: No results found. No images are attached to the encounter.  Labs: Lab Results  Component Value Date   HGBA1C 6.7 (H) 06/07/2017   HGBA1C 10.3 (H) 08/17/2016   HGBA1C 7.7 (H) 12/18/2013   ESRSEDRATE 5 06/04/2017   ESRSEDRATE 110 (H) 12/19/2013   ESRSEDRATE 104 (H) 12/18/2013   CRP <0.8 06/05/2017   CRP 12.2 (H) 12/19/2013   CRP 14.9 (H) 12/18/2013   REPTSTATUS 12/24/2013 FINAL 12/18/2013   REPTSTATUS 12/24/2013 FINAL 12/18/2013   GRAMSTAIN  04/19/2011    WBC PRESENT,BOTH PMN AND MONONUCLEAR NO ORGANISMS SEEN   CULT  12/18/2013    NO GROWTH 5 DAYS Performed at [facility]  12/18/2013    NO GROWTH 5 DAYS Performed at [facility]      Lab Results  Component Value Date   ALBUMIN 3.8 06/05/2017   ALBUMIN 4.3 12/14/2016   ALBUMIN 3.7 09/19/2016    Body mass index is 22.5 kg/m.  Orders:  No orders of the defined types were  placed in this encounter.  Meds ordered this encounter  Medications  . oxyCODONE-acetaminophen (PERCOCET/ROXICET) 5-325 MG tablet    Sig: Take 1 tablet by mouth every 8 (eight) hours as needed.    Dispense:  20 tablet    Refill:  0     Procedures: No procedures performed  Clinical Data: No additional findings.  ROS:  All other systems negative, except as noted in the HPI. Review of Systems  Objective: Vital Signs: Ht 6\' 3"  (1.905 m)   Wt 180 lb (81.6 kg)   BMI 22.50 kg/m   Specialty Comments:  No specialty comments available.  PMFS History: Patient Active Problem List   Diagnosis Date Noted  . Diabetic foot infection ([place]) 06/22/2017  . Post op infection 06/22/2017  . Chronic osteomyelitis of toe, right ([place])   . Toe osteomyelitis, right ([place]) 06/05/2017  . Right second toe  ulcer ([place]) 06/05/2017  . Chronic ulcer of right foot limited to breakdown of skin ([place])   . SVT (supraventricular tachycardia) ([place]) 08/17/2016  . Tobacco use disorder 10/25/2014  . Diabetes mellitus type 2 in nonobese (HCC)   . Swelling   . Type 2 diabetes mellitus with left diabetic foot ulcer ([place]) 12/18/2013  . Diabetes mellitus due to underlying condition with foot ulcer ([place]) 12/18/2013  . Left leg cellulitis 12/18/2013  . Cellulitis of right lower extremity 07/19/2013  . Cocaine abuse ([place]) 03/29/2012  . Generalized anxiety disorder 03/29/2012  . Panic attacks 03/29/2012  . Alcohol dependence ([place]) 08/05/2011    Class: Chronic  . Substance abuse ([place]) 05/31/2011  . Ulcer of left foot, limited to breakdown of skin ([place]) 02/15/2011  . Neuropathic ulcer of foot ([place]) 02/15/2011   Past Medical History:  Diagnosis Date  . Anxiety   . Arthritis    "everywhere" (04/21/2012)  . Cellulitis and abscess of foot 12/19/2013   LEFT FOOT  . Chronic pain   . DDD (degenerative disc disease)   . Depression   . Diabetes mellitus without complication (HCC)    borderline  . Diabetic foot ulcer ([place])   . ETOH abuse   . GERD (gastroesophageal reflux disease)    tums  . History of blood transfusion    "related to left knee OR; probably right hip too" (04/21/2012)  . Mental disorder   . Neuromuscular disorder (HCC)    neuropathy  . Noncompliance   . Open wound    bottom of foot  . Pneumonia ~ 2012  . Polysubstance abuse (HCC)    etoh, cocaine, heroin  . Stroke [facility]) 2008   "they said I might have had one during right hip replacement" (04/21/2012)    Family History  Problem Relation Age of Onset  . Diabetes Mother   . Diabetes Father     Past Surgical History:  Procedure Laterality Date  . AMPUTATION Right 06/09/2017   Procedure: RIGHT 2ND TOE AMPUTATION;  Surgeon: [name], MD;  Location: [facility];  Service: Orthopedics;  Laterality: Right;  . JOINT REPLACEMENT    . KNEE  ARTHROSCOPY Bilateral 1980's/1990's  . LUNG LOBECTOMY Left ~ 2006  . LUNG LOBECTOMY    . METATARSAL OSTEOTOMY  10/29/2011   Procedure: METATARSAL OSTEOTOMY;  Surgeon: [name], MD;  Location: [facility];  Service: Orthopedics;  Laterality: Left;  Left 1st Metatarsal Dorsal Closing Wedge   . METATARSAL OSTEOTOMY Right 04/28/2017   Procedure: RIGHT 1ST METATARSAL DORSAL CLOSING WEDGE OSTEOTOMY;  Surgeon: [name], MD;  Location: [facility]  OR;  Service: Orthopedics;  Laterality: Right;  . REVISION TOTAL HIP ARTHROPLASTY Right 2008   "4-5 months after replacement" (04/21/2012)  . TOTAL HIP ARTHROPLASTY Right 2008  . TOTAL KNEE ARTHROPLASTY Left 2006  . TOTAL KNEE ARTHROPLASTY Right 04/20/2012  . TOTAL KNEE ARTHROPLASTY Right 04/20/2012   Procedure: TOTAL KNEE ARTHROPLASTY;  Surgeon: [name], MD;  Location: [facility];  Service: Orthopedics;  Laterality: Right;  Right Total Knee Arthroplasty   Social History   Occupational History  . Not on file  Tobacco Use  . Smoking status: Current Every Day Smoker    Packs/day: 0.50    Years: 30.00    Pack years: 15.00    Types: Cigarettes  . Smokeless tobacco: Never Used  Substance and Sexual Activity  . Alcohol use: No    Alcohol/week: 0.0 oz    Frequency: Never  . Drug use: Yes    Types: Cocaine    Comment: Cocaine and heroin   . Sexual activity: Yes    Birth control/protection: None

## 2017-07-16 NOTE — Telephone Encounter (Signed)
Called and lm on vm to advise that we saw the pt in the office today and that he is going to be set up for amputation of the right foot 4th toe. To call with any questions otherwise gave the verbal ok for the visit he was asking for.

## 2017-07-19 ENCOUNTER — Ambulatory Visit (INDEPENDENT_AMBULATORY_CARE_PROVIDER_SITE_OTHER): Payer: Medicare Other | Admitting: Orthopedic Surgery

## 2017-07-19 ENCOUNTER — Other Ambulatory Visit (INDEPENDENT_AMBULATORY_CARE_PROVIDER_SITE_OTHER): Payer: Self-pay | Admitting: Orthopedic Surgery

## 2017-07-19 DIAGNOSIS — M86271 Subacute osteomyelitis, right ankle and foot: Secondary | ICD-10-CM

## 2017-07-20 ENCOUNTER — Other Ambulatory Visit (HOSPITAL_COMMUNITY): Payer: Self-pay | Admitting: *Deleted

## 2017-07-20 ENCOUNTER — Other Ambulatory Visit: Payer: Self-pay

## 2017-07-20 ENCOUNTER — Encounter (HOSPITAL_COMMUNITY): Payer: Self-pay | Admitting: *Deleted

## 2017-07-20 NOTE — Progress Notes (Signed)
Pt denies SOB, chest pain, and being under the care of a cardiologist. Pt denies having a stress test and cardiac cath. Pt denies recent labs. Pt stated that he is out of Cardizem and has not taken it in a month " I am having problems paying for my medication."  Pt made aware to stop taking vitamins, fish oil and herbal medications. Do not take any NSAIDs ie: Ibuprofen, Advil, Naproxen (Aleve), Motrin, BC and Goody Powder. Pt verbalized understanding of all pre-op instructions.

## 2017-07-20 NOTE — Progress Notes (Signed)
Stat order for Case Management on DOS.

## 2017-07-22 NOTE — Anesthesia Preprocedure Evaluation (Signed)
Anesthesia Evaluation  Patient identified by MRN, date of birth, ID band Patient awake    Reviewed: Allergy & Precautions, H&P , NPO status , Patient's Chart, lab work & pertinent test results  Airway Mallampati: I  TM Distance: >3 FB Neck ROM: Full    Dental no notable dental hx. (+) Edentulous Upper, Edentulous Lower, Dental Advisory Given   Pulmonary Current Smoker,    Pulmonary exam normal breath sounds clear to auscultation       Cardiovascular hypertension, Pt. on medications  Rhythm:Regular Rate:Normal     Neuro/Psych Anxiety Depression CVA, No Residual Symptoms    GI/Hepatic GERD  Controlled,(+)     substance abuse  alcohol use,   Endo/Other  diabetes, Type 2, Oral Hypoglycemic Agents  Renal/GU negative Renal ROS  negative genitourinary   Musculoskeletal  (+) Arthritis , Osteoarthritis,    Abdominal   Peds  Hematology negative hematology ROS (+)   Anesthesia Other Findings   Reproductive/Obstetrics negative OB ROS                             Anesthesia Physical  Anesthesia Plan  ASA: III  Anesthesia Plan: General   Post-op Pain Management:    Induction: Intravenous  PONV Risk Score and Plan: 2 and Ondansetron and Midazolam  Airway Management Planned: LMA  Additional Equipment:   Intra-op Plan:   Post-operative Plan: Extubation in OR  Informed Consent: I have reviewed the patients History and Physical, chart, labs and discussed the procedure including the risks, benefits and alternatives for the proposed anesthesia with the patient or authorized representative who has indicated his/her understanding and acceptance.   Dental advisory given  Plan Discussed with: CRNA  Anesthesia Plan Comments:         Anesthesia Quick Evaluation

## 2017-07-23 ENCOUNTER — Encounter (HOSPITAL_COMMUNITY): Payer: Self-pay | Admitting: *Deleted

## 2017-07-23 ENCOUNTER — Ambulatory Visit (HOSPITAL_COMMUNITY)
Admission: RE | Admit: 2017-07-23 | Discharge: 2017-07-23 | Disposition: A | Discharge status: Home / Self Care | Payer: Medicare Other | Source: Ambulatory Visit | Attending: Orthopedic Surgery | Admitting: Orthopedic Surgery

## 2017-07-23 ENCOUNTER — Ambulatory Visit (HOSPITAL_COMMUNITY): Payer: Medicare Other | Admitting: Anesthesiology

## 2017-07-23 ENCOUNTER — Encounter (HOSPITAL_COMMUNITY)
Admission: RE | Disposition: A | Discharge status: Home / Self Care | Payer: Self-pay | Source: Ambulatory Visit | Attending: Orthopedic Surgery

## 2017-07-23 ENCOUNTER — Other Ambulatory Visit: Payer: Self-pay

## 2017-07-23 DIAGNOSIS — F419 Anxiety disorder, unspecified: Secondary | ICD-10-CM | POA: Diagnosis not present

## 2017-07-23 DIAGNOSIS — Z7984 Long term (current) use of oral hypoglycemic drugs: Secondary | ICD-10-CM | POA: Diagnosis not present

## 2017-07-23 DIAGNOSIS — F17200 Nicotine dependence, unspecified, uncomplicated: Secondary | ICD-10-CM | POA: Diagnosis not present

## 2017-07-23 DIAGNOSIS — Z96653 Presence of artificial knee joint, bilateral: Secondary | ICD-10-CM | POA: Diagnosis not present

## 2017-07-23 DIAGNOSIS — I10 Essential (primary) hypertension: Secondary | ICD-10-CM | POA: Insufficient documentation

## 2017-07-23 DIAGNOSIS — Z96641 Presence of right artificial hip joint: Secondary | ICD-10-CM | POA: Diagnosis not present

## 2017-07-23 DIAGNOSIS — E1140 Type 2 diabetes mellitus with diabetic neuropathy, unspecified: Secondary | ICD-10-CM | POA: Insufficient documentation

## 2017-07-23 DIAGNOSIS — K219 Gastro-esophageal reflux disease without esophagitis: Secondary | ICD-10-CM | POA: Diagnosis not present

## 2017-07-23 DIAGNOSIS — E1169 Type 2 diabetes mellitus with other specified complication: Secondary | ICD-10-CM | POA: Diagnosis not present

## 2017-07-23 DIAGNOSIS — Z888 Allergy status to other drugs, medicaments and biological substances status: Secondary | ICD-10-CM | POA: Diagnosis not present

## 2017-07-23 DIAGNOSIS — M86271 Subacute osteomyelitis, right ankle and foot: Secondary | ICD-10-CM

## 2017-07-23 DIAGNOSIS — M868X7 Other osteomyelitis, ankle and foot: Secondary | ICD-10-CM | POA: Diagnosis not present

## 2017-07-23 DIAGNOSIS — Z89421 Acquired absence of other right toe(s): Secondary | ICD-10-CM | POA: Insufficient documentation

## 2017-07-23 DIAGNOSIS — M1990 Unspecified osteoarthritis, unspecified site: Secondary | ICD-10-CM | POA: Insufficient documentation

## 2017-07-23 DIAGNOSIS — Z79899 Other long term (current) drug therapy: Secondary | ICD-10-CM | POA: Insufficient documentation

## 2017-07-23 DIAGNOSIS — L97521 Non-pressure chronic ulcer of other part of left foot limited to breakdown of skin: Secondary | ICD-10-CM | POA: Insufficient documentation

## 2017-07-23 DIAGNOSIS — E11621 Type 2 diabetes mellitus with foot ulcer: Secondary | ICD-10-CM | POA: Insufficient documentation

## 2017-07-23 HISTORY — DX: Complete loss of teeth, unspecified cause, unspecified class: K08.109

## 2017-07-23 HISTORY — DX: Osteomyelitis, unspecified: M86.9

## 2017-07-23 HISTORY — PX: AMPUTATION: SHX166

## 2017-07-23 HISTORY — DX: Presence of dental prosthetic device (complete) (partial): Z97.2

## 2017-07-23 HISTORY — DX: Presence of spectacles and contact lenses: Z97.3

## 2017-07-23 LAB — GLUCOSE, CAPILLARY
Glucose-Capillary: 135 mg/dL — ABNORMAL HIGH (ref 70–99)
Glucose-Capillary: 138 mg/dL — ABNORMAL HIGH (ref 70–99)

## 2017-07-23 LAB — BASIC METABOLIC PANEL
Anion gap: 8 (ref 5–15)
BUN: 10 mg/dL (ref 6–20)
CALCIUM: 9.5 mg/dL (ref 8.9–10.3)
CO2: 25 mmol/L (ref 22–32)
Chloride: 103 mmol/L (ref 98–111)
Creatinine, Ser: 0.93 mg/dL (ref 0.61–1.24)
GFR calc Af Amer: >60 mL/min (ref >60)
GFR calc non Af Amer: >60 mL/min (ref >60)
GLUCOSE: 143 mg/dL — AB (ref 70–99)
Potassium: 3.8 mmol/L (ref 3.5–5.1)
Sodium: 136 mmol/L (ref 135–145)

## 2017-07-23 LAB — CBC
HCT: 41.3 % (ref 39.0–52.0)
HEMOGLOBIN: 13.4 g/dL (ref 13.0–17.0)
MCH: 27.5 pg (ref 26.0–34.0)
MCHC: 32.4 g/dL (ref 30.0–36.0)
MCV: 84.6 fL (ref 78.0–100.0)
Platelets: 258 10*3/uL (ref 150–400)
RBC: 4.88 MIL/uL (ref 4.22–5.81)
RDW: 14.2 % (ref 11.5–15.5)
WBC: 9.8 10*3/uL (ref 4.0–10.5)

## 2017-07-23 SURGERY — AMPUTATION DIGIT
Anesthesia: General | Laterality: Right

## 2017-07-23 MED ORDER — SUCCINYLCHOLINE CHLORIDE 200 MG/10ML IV SOSY
PREFILLED_SYRINGE | INTRAVENOUS | Status: AC
  Filled 2017-07-23: qty 10

## 2017-07-23 MED ORDER — OXYCODONE HCL 5 MG/5ML PO SOLN: 5.0000 mg | Freq: Once | ORAL | Status: DC | PRN

## 2017-07-23 MED ORDER — HYDROMORPHONE HCL 1 MG/ML IJ SOLN
0.2500 mg | INTRAMUSCULAR | Status: DC | PRN
  Administered 2017-07-23 (×4): 0.5 mg via INTRAVENOUS

## 2017-07-23 MED ORDER — MEPERIDINE HCL 50 MG/ML IJ SOLN: 6.2500 mg | INTRAMUSCULAR | Status: DC | PRN

## 2017-07-23 MED ORDER — HYDROMORPHONE HCL 1 MG/ML IJ SOLN
INTRAMUSCULAR | Status: AC
  Administered 2017-07-23: 0.5 mg via INTRAVENOUS
  Filled 2017-07-23: qty 1

## 2017-07-23 MED ORDER — PROPOFOL 10 MG/ML IV BOLUS
INTRAVENOUS | Status: AC
  Filled 2017-07-23: qty 20

## 2017-07-23 MED ORDER — LACTATED RINGERS IV SOLN
INTRAVENOUS | Status: DC
  Administered 2017-07-23: 07:00:00 via INTRAVENOUS

## 2017-07-23 MED ORDER — ONDANSETRON HCL 4 MG/2ML IJ SOLN
INTRAMUSCULAR | Status: AC
  Filled 2017-07-23: qty 2

## 2017-07-23 MED ORDER — MIDAZOLAM HCL 2 MG/2ML IJ SOLN
INTRAMUSCULAR | Status: AC
  Filled 2017-07-23: qty 2

## 2017-07-23 MED ORDER — OXYCODONE-ACETAMINOPHEN 5-325 MG PO TABS: 1.0000 | ORAL_TABLET | ORAL | 0 refills | Status: DC | PRN

## 2017-07-23 MED ORDER — FENTANYL CITRATE (PF) 250 MCG/5ML IJ SOLN
INTRAMUSCULAR | Status: AC
  Filled 2017-07-23: qty 5

## 2017-07-23 MED ORDER — ROCURONIUM BROMIDE 10 MG/ML (PF) SYRINGE
PREFILLED_SYRINGE | INTRAVENOUS | Status: AC
  Filled 2017-07-23: qty 10

## 2017-07-23 MED ORDER — 0.9 % SODIUM CHLORIDE (POUR BTL) OPTIME
TOPICAL | Status: DC | PRN
  Administered 2017-07-23: 1000 mL

## 2017-07-23 MED ORDER — LIDOCAINE HCL (CARDIAC) PF 100 MG/5ML IV SOSY
PREFILLED_SYRINGE | INTRAVENOUS | Status: DC | PRN
  Administered 2017-07-23: 100 mg via INTRAVENOUS

## 2017-07-23 MED ORDER — CEFAZOLIN SODIUM-DEXTROSE 2-4 GM/100ML-% IV SOLN
2.0000 g | INTRAVENOUS | Status: AC
  Administered 2017-07-23: 2 g via INTRAVENOUS
  Filled 2017-07-23: qty 100

## 2017-07-23 MED ORDER — HYDROMORPHONE HCL 1 MG/ML IJ SOLN
INTRAMUSCULAR | Status: AC
  Filled 2017-07-23: qty 1

## 2017-07-23 MED ORDER — MIDAZOLAM HCL 2 MG/2ML IJ SOLN
INTRAMUSCULAR | Status: AC
  Filled 2017-07-23: qty 4

## 2017-07-23 MED ORDER — ONDANSETRON HCL 4 MG/2ML IJ SOLN
INTRAMUSCULAR | Status: DC | PRN
  Administered 2017-07-23: 4 mg via INTRAVENOUS

## 2017-07-23 MED ORDER — OXYCODONE HCL 5 MG PO TABS: 5.0000 mg | ORAL_TABLET | Freq: Once | ORAL | Status: DC | PRN

## 2017-07-23 MED ORDER — PHENYLEPHRINE HCL 10 MG/ML IJ SOLN
INTRAMUSCULAR | Status: DC | PRN
Start: 2017-07-23 — End: 2017-07-23
  Administered 2017-07-23: 120 ug via INTRAVENOUS
  Administered 2017-07-23: 80 ug via INTRAVENOUS
  Administered 2017-07-23: 160 ug via INTRAVENOUS

## 2017-07-23 MED ORDER — SUGAMMADEX SODIUM 200 MG/2ML IV SOLN
INTRAVENOUS | Status: AC
  Filled 2017-07-23: qty 2

## 2017-07-23 MED ORDER — CHLORHEXIDINE GLUCONATE 4 % EX LIQD: 60.0000 mL | Freq: Once | CUTANEOUS | Status: DC

## 2017-07-23 MED ORDER — DEXAMETHASONE SODIUM PHOSPHATE 10 MG/ML IJ SOLN
INTRAMUSCULAR | Status: AC
  Filled 2017-07-23: qty 1

## 2017-07-23 MED ORDER — PROMETHAZINE HCL 25 MG/ML IJ SOLN: 6.2500 mg | INTRAMUSCULAR | Status: DC | PRN

## 2017-07-23 MED ORDER — PROPOFOL 10 MG/ML IV BOLUS
INTRAVENOUS | Status: DC | PRN
  Administered 2017-07-23: 160 mg via INTRAVENOUS

## 2017-07-23 MED ORDER — PHENYLEPHRINE 40 MCG/ML (10ML) SYRINGE FOR IV PUSH (FOR BLOOD PRESSURE SUPPORT)
PREFILLED_SYRINGE | INTRAVENOUS | Status: AC
  Filled 2017-07-23: qty 10

## 2017-07-23 SURGICAL SUPPLY — 30 items
BLADE SURG 21 STRL SS (BLADE) ×1 IMPLANT
BNDG CMPR 9X4 STRL LF SNTH (GAUZE/BANDAGES/DRESSINGS)
BNDG COHESIVE 4X5 TAN STRL (GAUZE/BANDAGES/DRESSINGS) ×3 IMPLANT
BNDG ESMARK 4X9 LF (GAUZE/BANDAGES/DRESSINGS) IMPLANT
BNDG GAUZE ELAST 4 BULKY (GAUZE/BANDAGES/DRESSINGS) ×3 IMPLANT
COVER SURGICAL LIGHT HANDLE (MISCELLANEOUS) ×4 IMPLANT
DRAPE U-SHAPE 47X51 STRL (DRAPES) ×3 IMPLANT
DRSG ADAPTIC 3X8 NADH LF (GAUZE/BANDAGES/DRESSINGS) ×4 IMPLANT
DRSG PAD ABDOMINAL 8X10 ST (GAUZE/BANDAGES/DRESSINGS) ×3 IMPLANT
DURAPREP 26ML APPLICATOR (WOUND CARE) ×3 IMPLANT
ELECT REM PT RETURN 9FT ADLT (ELECTROSURGICAL) ×3
ELECTRODE REM PT RTRN 9FT ADLT (ELECTROSURGICAL) ×1 IMPLANT
GAUZE SPONGE 4X4 12PLY STRL (GAUZE/BANDAGES/DRESSINGS) ×2 IMPLANT
GAUZE SPONGE 4X4 12PLY STRL LF (GAUZE/BANDAGES/DRESSINGS) ×2 IMPLANT
GLOVE BIOGEL PI IND STRL 9 (GLOVE) ×1 IMPLANT
GLOVE BIOGEL PI INDICATOR 9 (GLOVE) ×2
GLOVE SURG ORTHO 9.0 STRL STRW (GLOVE) ×3 IMPLANT
GOWN STRL REUS W/ TWL XL LVL3 (GOWN DISPOSABLE) ×2 IMPLANT
GOWN STRL REUS W/TWL XL LVL3 (GOWN DISPOSABLE) ×9
KIT BASIN OR (CUSTOM PROCEDURE TRAY) ×3 IMPLANT
KIT TURNOVER KIT B (KITS) ×3 IMPLANT
MANIFOLD NEPTUNE II (INSTRUMENTS) ×3 IMPLANT
NEEDLE 22X1 1/2 (OR ONLY) (NEEDLE) IMPLANT
NS IRRIG 1000ML POUR BTL (IV SOLUTION) ×3 IMPLANT
PACK ORTHO EXTREMITY (CUSTOM PROCEDURE TRAY) ×3 IMPLANT
PAD ABD 8X10 STRL (GAUZE/BANDAGES/DRESSINGS) ×2 IMPLANT
PAD ARMBOARD 7.5X6 YLW CONV (MISCELLANEOUS) ×2 IMPLANT
SUT ETHILON 2 0 PSLX (SUTURE) ×3 IMPLANT
SYR CONTROL 10ML LL (SYRINGE) IMPLANT
TOWEL OR 17X26 10 PK STRL BLUE (TOWEL DISPOSABLE) ×1 IMPLANT

## 2017-07-23 NOTE — Op Note (Signed)
07/23/2017  8:14 AM  PATIENT:  [name]    PRE-OPERATIVE DIAGNOSIS:  Osteomyelitis Right 4th Toe  POST-OPERATIVE DIAGNOSIS:  Same  PROCEDURE:  RIGHT 4TH TOE AMPUTATION  SURGEON:  [name], MD  PHYSICIAN ASSISTANT:None ANESTHESIA:   General  PREOPERATIVE INDICATIONS:  [name] is a  52 y.o. male with a diagnosis of Osteomyelitis Right 4th Toe who failed conservative measures and elected for surgical management.    The risks benefits and alternatives were discussed with the patient preoperatively including but not limited to the risks of infection, bleeding, nerve injury, cardiopulmonary complications, the need for revision surgery, among others, and the patient was willing to proceed.  OPERATIVE IMPLANTS: None  @ENCIMAGES @  OPERATIVE FINDINGS: Good petechial bleeding  OPERATIVE PROCEDURE: Patient brought the operating room and underwent a general anesthetic.  After adequate levels of anesthesia were obtained patient's right lower extremity was prepped using DuraPrep draped in the sterile field a timeout was called.  A V incision was made around the fourth toe the toe was amputated through the MTP joint.  Electrocautery was used for hemostasis.  The wound was irrigated with normal saline.  The incision was closed using 2-0 nylon.  A sterile dressing was applied patient was extubated and taken the PACU in stable condition.   DISCHARGE PLANNING:  Antibiotic duration: Preoperative antibiotics  Weightbearing: Touchdown weightbearing on the right  Pain medication: Prescription for Percocet  Dressing care/ Wound VAC: Keep dressing dry until follow-up in 1 week  Ambulatory devices: Walker or crutches  Discharge to: Home.  Follow-up: In the office 1 week post operative.

## 2017-07-23 NOTE — Progress Notes (Signed)
Orthopedic Tech Progress Note Patient Details:  [name] 02/24/1965 [id number]  Ortho Devices Type of Ortho Device: Postop shoe/boot Ortho Device/Splint Interventions: Application   Post Interventions Patient Tolerated: Well Instructions Provided: Care of device   [name] 07/23/2017, 9:28 AM

## 2017-07-23 NOTE — Anesthesia Procedure Notes (Signed)
Procedure Name: LMA Insertion Date/Time: 07/23/2017 7:59 AM Performed by: Shirlyn Goltz, CRNA Pre-anesthesia Checklist: Patient identified, Emergency Drugs available, Suction available and Patient being monitored Patient Re-evaluated:Patient Re-evaluated prior to induction Oxygen Delivery Method: Circle system utilized Preoxygenation: Pre-oxygenation with 100% oxygen Induction Type: IV induction Ventilation: Mask ventilation without difficulty LMA: LMA flexible inserted LMA Size: 5.0 Number of attempts: 1 Placement Confirmation: positive ETCO2 and breath sounds checked- equal and bilateral Tube secured with: Tape Dental Injury: Teeth and Oropharynx as per pre-operative assessment

## 2017-07-23 NOTE — H&P (Signed)
[name] is an 52 y.o. male.   Chief Complaint: Cellulitis osteomyelitis right foot fourth toe. HPI: Patient is a 52 year old gentleman who was seen in follow-up for both feet.  He has a Waggoner grade 1 ulcer beneath the first metatarsal head of the left foot cellulitis swelling ulceration right foot fourth toe status post second toe amputation right foot.    Past Medical History:  Diagnosis Date  . Anxiety   . Arthritis    "everywhere" (04/21/2012)  . Cellulitis and abscess of foot 12/19/2013   LEFT FOOT  . Chronic pain   . DDD (degenerative disc disease)   . Depression   . Diabetes mellitus without complication (HCC)    borderline  . Diabetic foot ulcer ([place])   . ETOH abuse   . Full dentures   . GERD (gastroesophageal reflux disease)    tums  . History of blood transfusion    "related to left knee OR; probably right hip too" (04/21/2012)  . Mental disorder   . Neuromuscular disorder (HCC)    neuropathy  . Noncompliance   . Open wound    bottom of foot  . Osteomyelitis ([place])    right 4th toe  . Pneumonia ~ 2012  . Polysubstance abuse (HCC)    etoh, cocaine, heroin  . Stroke [facility]) 2008   "they said I might have had one during right hip replacement" (04/21/2012)  . Wears glasses     Past Surgical History:  Procedure Laterality Date  . AMPUTATION Right 06/09/2017   Procedure: RIGHT 2ND TOE AMPUTATION;  Surgeon: [name], MD;  Location: [facility];  Service: Orthopedics;  Laterality: Right;  . JOINT REPLACEMENT    . KNEE ARTHROSCOPY Bilateral 1980's/1990's  . LUNG LOBECTOMY Left ~ 2006  . LUNG LOBECTOMY    . METATARSAL OSTEOTOMY  10/29/2011   Procedure: METATARSAL OSTEOTOMY;  Surgeon: [name], MD;  Location: [place];  Service: Orthopedics;  Laterality: Left;  Left 1st Metatarsal Dorsal Closing Wedge   . METATARSAL OSTEOTOMY Right 04/28/2017   Procedure: RIGHT 1ST METATARSAL DORSAL CLOSING WEDGE OSTEOTOMY;  Surgeon: [name], MD;  Location: [place];  Service:  Orthopedics;  Laterality: Right;  . MULTIPLE TOOTH EXTRACTIONS    . REVISION TOTAL HIP ARTHROPLASTY Right 2008   "4-5 months after replacement" (04/21/2012)  . TOTAL HIP ARTHROPLASTY Right 2008  . TOTAL KNEE ARTHROPLASTY Left 2006  . TOTAL KNEE ARTHROPLASTY Right 04/20/2012  . TOTAL KNEE ARTHROPLASTY Right 04/20/2012   Procedure: TOTAL KNEE ARTHROPLASTY;  Surgeon: [name], MD;  Location: [place];  Service: Orthopedics;  Laterality: Right;  Right Total Knee Arthroplasty    Family History  Problem Relation Age of Onset  . Diabetes Father    Social History:  reports that he has been smoking cigarettes.  He has a 30.00 pack-year smoking history. He has never used smokeless tobacco. He reports that he has current or past drug history. Drug: Cocaine. He reports that he does not drink alcohol.  Allergies:  Allergies  Allergen Reactions  . Benadryl [Diphenhydramine Hcl] Other (See Comments)    Leg spasms   . Trazodone And Nefazodone Other (See Comments)    Leg spasms     Medications Prior to Admission  Medication Sig Dispense Refill  . acetaminophen (TYLENOL) 325 MG tablet Take 2 tablets (650 mg total) by mouth every 6 (six) hours as needed for mild pain (or Fever >/= 101).    . Cholecalciferol (VITAMIN D3) 5000 units  CAPS Take 10,000 Units by mouth daily.    Marland Kitchen doxycycline (VIBRA-TABS) 100 MG tablet Take 1 tablet (100 mg total) by mouth every 12 (twelve) hours. 14 tablet 0  . doxycycline (VIBRA-TABS) 100 MG tablet TAKE 1 TABLET BY MOUTH TWICE A DAY 60 tablet 0  . hydrOXYzine (ATARAX/VISTARIL) 10 MG tablet Take 1 tablet (10 mg total) by mouth every 6 (six) hours as needed for anxiety. 30 tablet 0  . metFORMIN (GLUCOPHAGE) 500 MG tablet Take 1 tablet (500 mg total) by mouth 2 (two) times daily with a meal. (Patient taking differently: Take 1,000 mg by mouth 2 (two) times daily with a meal. ) 180 tablet 0  . oxyCODONE-acetaminophen (PERCOCET/ROXICET) 5-325 MG tablet Take 1 tablet by mouth every  8 (eight) hours as needed. 20 tablet 0  . QUEtiapine (SEROQUEL) 100 MG tablet Take 100 mg by mouth at bedtime.    . vitamin C (ASCORBIC ACID) 500 MG tablet Take 500 mg by mouth daily.    . blood glucose meter kit and supplies KIT Dispense based on patient and insurance preference. Use up to four times daily as directed. (FOR ICD-9 250.00, 250.01). 1 each 0  . dapagliflozin propanediol (FARXIGA) 10 MG TABS tablet Take 10 mg by mouth daily.    Marland Kitchen diltiazem (CARDIZEM) 30 MG tablet Take 1 tablet (30 mg total) by mouth 2 (two) times daily. 180 tablet 3  . oxyCODONE-acetaminophen (PERCOCET/ROXICET) 5-325 MG tablet Take 1 tablet by mouth 2 (two) times daily as needed. (Patient not taking: Reported on 07/19/2017) 14 tablet 0  . rosuvastatin (CRESTOR) 20 MG tablet Take 20 mg by mouth daily.    Marland Kitchen saccharomyces boulardii (FLORASTOR) 250 MG capsule Take 1 capsule (250 mg total) by mouth 2 (two) times daily. (Patient not taking: Reported on 07/19/2017) 60 capsule 0  . sulfamethoxazole-trimethoprim (BACTRIM DS,SEPTRA DS) 800-160 MG tablet Take 1 tablet by mouth 2 (two) times daily. (Patient not taking: Reported on 07/19/2017) 20 tablet 0    Results for orders placed or performed during the hospital encounter of 07/23/17 (from the past 48 hour(s))  Basic metabolic panel     Status: Abnormal   Collection Time: 07/23/17  6:38 AM  Result Value Ref Range   Sodium 136 135 - 145 mmol/L   Potassium 3.8 3.5 - 5.1 mmol/L   Chloride 103 98 - 111 mmol/L    Comment: Please note change in reference range.   CO2 25 22 - 32 mmol/L   Glucose, Bld 143 (H) 70 - 99 mg/dL    Comment: Please note change in reference range.   BUN 10 6 - 20 mg/dL    Comment: Please note change in reference range.   Creatinine, Ser 0.93 0.61 - 1.24 mg/dL   Calcium 9.5 8.9 - 10.3 mg/dL   GFR calc non Af Amer >60 >60 mL/min   GFR calc Af Amer >60 >60 mL/min    Comment: (NOTE) The eGFR has been calculated using the CKD EPI equation. This calculation  has not been validated in all clinical situations. eGFR's persistently <60 mL/min signify possible Chronic Kidney Disease.    Anion gap 8 5 - 15    Comment: Performed at [facility] [address], [place] [address]  CBC     Status: None   Collection Time: 07/23/17  6:38 AM  Result Value Ref Range   WBC 9.8 4.0 - 10.5 K/uL   RBC 4.88 4.22 - 5.81 MIL/uL   Hemoglobin 13.4  13.0 - 17.0 g/dL   HCT 41.3 39.0 - 52.0 %   MCV 84.6 78.0 - 100.0 fL   MCH 27.5 26.0 - 34.0 pg   MCHC 32.4 30.0 - 36.0 g/dL   RDW 14.2 11.5 - 15.5 %   Platelets 258 150 - 400 K/uL    Comment: Performed at [facility], [place] [address], [place], [place] [address]   No results found.  Review of Systems  All other systems reviewed and are negative.   Blood pressure 138/86, pulse 70, temperature 97.7 F (36.5 C), resp. rate 20, height '6\' 3"'$  (1.905 m), weight 180 lb (81.6 kg), SpO2 100 %. Physical Exam   Patient is alert, oriented, no adenopathy, well-dressed, normal affect, normal respiratory effort. Examination patient has palpable pulses he has a Waggoner grade 1 ulcer beneath the first metatarsal head of the left foot.  After informed consent a 10 blade knife was used to debride the skin and soft tissue back to healthy viable granulation tissue this was touched with silver nitrate the ulcer is 2 cm in diameter 2 mm deep this does not probe to bone or tendon.  Semination the right foot the cellulitis and swelling is resolving and the fourth toe he will complete his course of Bactrim DS he is starting to get a GI upset from the oral antibiotics.  The right foot has black changes to the dorsum of the fourth toe with an ulcer that probes to bone consistent with osteomyelitis of the fourth toe there is some mild redness in the foot but no cellulitis no tenderness no signs of infection in the foot.  The skin wrinkles well.   Assessment/Plan 1. Ulcer of left foot, limited to breakdown of skin ([place])    2. Toe osteomyelitis, right ([place])     Plan: Patient will need to proceed with an amputation of the right foot fourth toe we will also need to get him custom orthotics for his shoes.     [name], MD 07/23/2017, 7:25 AM

## 2017-07-23 NOTE — Transfer of Care (Signed)
Immediate Anesthesia Transfer of Care Note  Patient: [name]  Procedure(s) Performed: RIGHT 4TH TOE AMPUTATION (Right )  Patient Location: PACU  Anesthesia Type:General  Level of Consciousness: drowsy  Airway & Oxygen Therapy: Patient Spontanous Breathing  Post-op Assessment: Report given to RN and Post -op Vital signs reviewed and stable  Post vital signs: Reviewed and stable  Last Vitals:  Vitals Value Taken Time  BP 109/75 07/23/2017  8:21 AM  Temp    Pulse 70 07/23/2017  8:23 AM  Resp 17 07/23/2017  8:23 AM  SpO2 100 % 07/23/2017  8:23 AM  Vitals shown include unvalidated device data.  Last Pain:  Vitals:   07/23/17 0631  PainSc: 8       Patients Stated Pain Goal: 5 (88/64/84 7207)  Complications: No apparent anesthesia complications

## 2017-07-26 ENCOUNTER — Encounter (HOSPITAL_COMMUNITY): Payer: Self-pay | Admitting: Orthopedic Surgery

## 2017-07-26 NOTE — Anesthesia Postprocedure Evaluation (Signed)
Anesthesia Post Note  Patient: [name]  Procedure(s) Performed: RIGHT 4TH TOE AMPUTATION (Right )     Patient location during evaluation: PACU Anesthesia Type: General Level of consciousness: sedated and patient cooperative Pain management: pain level controlled Vital Signs Assessment: post-procedure vital signs reviewed and stable Respiratory status: spontaneous breathing Cardiovascular status: stable Anesthetic complications: no    Last Vitals:  Vitals:   07/23/17 0905 07/23/17 0920  BP: 138/82 131/84  Pulse: 65 71  Resp: 14 13  Temp:  (!) 36.3 C  SpO2: 100% 100%    Last Pain:  Vitals:   07/23/17 0920  PainSc: [place]

## 2017-07-29 ENCOUNTER — Ambulatory Visit (INDEPENDENT_AMBULATORY_CARE_PROVIDER_SITE_OTHER): Payer: Medicare Other | Admitting: Family

## 2017-07-29 ENCOUNTER — Encounter (INDEPENDENT_AMBULATORY_CARE_PROVIDER_SITE_OTHER): Payer: Self-pay | Admitting: Family

## 2017-07-29 DIAGNOSIS — Z89421 Acquired absence of other right toe(s): Secondary | ICD-10-CM

## 2017-07-29 DIAGNOSIS — L97529 Non-pressure chronic ulcer of other part of left foot with unspecified severity: Secondary | ICD-10-CM

## 2017-07-29 DIAGNOSIS — E11621 Type 2 diabetes mellitus with foot ulcer: Secondary | ICD-10-CM

## 2017-07-29 HISTORY — DX: Acquired absence of other right toe(s): Z89.421

## 2017-07-29 MED ORDER — OXYCODONE-ACETAMINOPHEN 5-325 MG PO TABS: 1.0000 | ORAL_TABLET | Freq: Four times a day (QID) | ORAL | 0 refills | Status: DC | PRN

## 2017-07-29 NOTE — Progress Notes (Signed)
Office Visit Note   Patient: [name]           Date of Birth: Jul 09, 1965           MRN: [id number] Visit Date: 07/29/2017              Requested by: Lucia Gaskins, MD [address] [place], [place] [address] PCP: Lucia Gaskins, MD  Chief Complaint  Patient presents with  . Right Foot - Routine Post Op      HPI: Patient is a 52 year old gentleman who was seen in follow-up for both feet.  He has a Hydrographic surveyor grade 1 ulcer beneath the first metatarsal head of the left foot cellulitis swelling ulceration right foot fourth toe status post second toe amputation right foot.  Assessment & Plan: Visit Diagnoses:  1. Type 2 diabetes mellitus with left diabetic foot ulcer ([place])   2. H/O amputation of lesser toe, right (HCC)     Plan: begin daily dial soap cleansing. Apply dry dressing. Minimize weight bearing on right.  Prescription provided for Percocet.   Follow-Up Instructions: Return in about 1 week (around 08/05/2017).   Ortho Exam  Patient is alert, oriented, no adenopathy, well-dressed, normal affect, normal respiratory effort. Examination patient has palpable pulses he has a Wagner grade 1 ulcer beneath the first metatarsal head of the left foot.  After informed consent a 10 blade knife was used to debride the skin and soft tissue back to healthy viable granulation tissue this was touched with silver nitrate the ulcer is 5 cm in diameter 2 mm deep this does not probe to bone or tendon.  Right foot fourth toe incision is well approximated. No drainage, no erythema. No sign of infection.  Imaging: No results found. No images are attached to the encounter.  Labs: Lab Results  Component Value Date   HGBA1C 6.7 (H) 06/07/2017   HGBA1C 10.3 (H) 08/17/2016   HGBA1C 7.7 (H) 12/18/2013   ESRSEDRATE 5 06/04/2017   ESRSEDRATE 110 (H) 12/19/2013   ESRSEDRATE 104 (H) 12/18/2013   CRP <0.8 06/05/2017   CRP 12.2 (H) 12/19/2013   CRP 14.9 (H) 12/18/2013   REPTSTATUS  12/24/2013 FINAL 12/18/2013   REPTSTATUS 12/24/2013 FINAL 12/18/2013   GRAMSTAIN  04/19/2011    WBC PRESENT,BOTH PMN AND MONONUCLEAR NO ORGANISMS SEEN   CULT  12/18/2013    NO GROWTH 5 DAYS Performed at [facility]  12/18/2013    NO GROWTH 5 DAYS Performed at [facility]      Lab Results  Component Value Date   ALBUMIN 3.8 06/05/2017   ALBUMIN 4.3 12/14/2016   ALBUMIN 3.7 09/19/2016    There is no height or weight on file to calculate BMI.  Orders:  No orders of the defined types were placed in this encounter.  No orders of the defined types were placed in this encounter.    Procedures: No procedures performed  Clinical Data: No additional findings.  ROS:  All other systems negative, except as noted in the HPI. Review of Systems  Constitutional: Negative for chills and fever.  Skin: Positive for wound.    Objective: Vital Signs: There were no vitals taken for this visit.  Specialty Comments:  No specialty comments available.  PMFS History: Patient Active Problem List   Diagnosis Date Noted  . H/O amputation of lesser toe, right ([place]) 07/29/2017  . Diabetic foot infection ([place]) 06/22/2017  . Post op infection 06/22/2017  . Subacute osteomyelitis,  right ankle and foot ([place])   . Toe osteomyelitis, right ([place]) 06/05/2017  . SVT (supraventricular tachycardia) ([place]) 08/17/2016  . Tobacco use disorder 10/25/2014  . Diabetes mellitus type 2 in nonobese (HCC)   . Swelling   . Type 2 diabetes mellitus with left diabetic foot ulcer ([place]) 12/18/2013  . Diabetes mellitus due to underlying condition with foot ulcer ([place]) 12/18/2013  . Left leg cellulitis 12/18/2013  . Cocaine abuse ([place]) 03/29/2012  . Generalized anxiety disorder 03/29/2012  . Panic attacks 03/29/2012  . Alcohol dependence ([place]) 08/05/2011    Class: Chronic  . Substance abuse ([place]) 05/31/2011  . Ulcer of left foot, limited to breakdown of skin ([place]) 02/15/2011  .  Neuropathic ulcer of foot ([place]) 02/15/2011   Past Medical History:  Diagnosis Date  . Anxiety   . Arthritis    "everywhere" (04/21/2012)  . Cellulitis and abscess of foot 12/19/2013   LEFT FOOT  . Chronic pain   . DDD (degenerative disc disease)   . Depression   . Diabetes mellitus without complication (HCC)    borderline  . Diabetic foot ulcer ([place])   . ETOH abuse   . Full dentures   . GERD (gastroesophageal reflux disease)    tums  . History of blood transfusion    "related to left knee OR; probably right hip too" (04/21/2012)  . Mental disorder   . Neuromuscular disorder (HCC)    neuropathy  . Noncompliance   . Open wound    bottom of foot  . Osteomyelitis ([place])    right 4th toe  . Pneumonia ~ 2012  . Polysubstance abuse (HCC)    etoh, cocaine, heroin  . Stroke [facility]) 2008   "they said I might have had one during right hip replacement" (04/21/2012)  . Wears glasses     Family History  Problem Relation Age of Onset  . Diabetes Father     Past Surgical History:  Procedure Laterality Date  . AMPUTATION Right 06/09/2017   Procedure: RIGHT 2ND TOE AMPUTATION;  Surgeon: [name], MD;  Location: [facility];  Service: Orthopedics;  Laterality: Right;  . AMPUTATION Right 07/23/2017   Procedure: RIGHT 4TH TOE AMPUTATION;  Surgeon: [name], MD;  Location: [facility];  Service: Orthopedics;  Laterality: Right;  . JOINT REPLACEMENT    . KNEE ARTHROSCOPY Bilateral 1980's/1990's  . LUNG LOBECTOMY Left ~ 2006  . LUNG LOBECTOMY    . METATARSAL OSTEOTOMY  10/29/2011   Procedure: METATARSAL OSTEOTOMY;  Surgeon: [name], MD;  Location: [facility];  Service: Orthopedics;  Laterality: Left;  Left 1st Metatarsal Dorsal Closing Wedge   . METATARSAL OSTEOTOMY Right 04/28/2017   Procedure: RIGHT 1ST METATARSAL DORSAL CLOSING WEDGE OSTEOTOMY;  Surgeon: [name], MD;  Location: [facility];  Service: Orthopedics;  Laterality: Right;  . MULTIPLE TOOTH EXTRACTIONS    . REVISION TOTAL HIP  ARTHROPLASTY Right 2008   "4-5 months after replacement" (04/21/2012)  . TOTAL HIP ARTHROPLASTY Right 2008  . TOTAL KNEE ARTHROPLASTY Left 2006  . TOTAL KNEE ARTHROPLASTY Right 04/20/2012  . TOTAL KNEE ARTHROPLASTY Right 04/20/2012   Procedure: TOTAL KNEE ARTHROPLASTY;  Surgeon: [name], MD;  Location: [facility];  Service: Orthopedics;  Laterality: Right;  Right Total Knee Arthroplasty   Social History   Occupational History  . Not on file  Tobacco Use  . Smoking status: Current Every Day Smoker    Packs/day: 1.00    Years: 30.00  Pack years: 30.00    Types: Cigarettes  . Smokeless tobacco: Never Used  Substance and Sexual Activity  . Alcohol use: No    Alcohol/week: 0.0 oz    Frequency: Never  . Drug use: Not Currently    Types: Cocaine    Comment: Cocaine and heroin   . Sexual activity: Yes    Birth control/protection: None

## 2017-08-04 ENCOUNTER — Ambulatory Visit (INDEPENDENT_AMBULATORY_CARE_PROVIDER_SITE_OTHER): Payer: Medicare Other | Admitting: Family

## 2017-08-04 ENCOUNTER — Encounter (INDEPENDENT_AMBULATORY_CARE_PROVIDER_SITE_OTHER): Payer: Self-pay | Admitting: Family

## 2017-08-04 VITALS — Ht 75.0 in | Wt 180.0 lb

## 2017-08-04 DIAGNOSIS — Z89421 Acquired absence of other right toe(s): Secondary | ICD-10-CM

## 2017-08-04 DIAGNOSIS — L97529 Non-pressure chronic ulcer of other part of left foot with unspecified severity: Secondary | ICD-10-CM

## 2017-08-04 DIAGNOSIS — L97521 Non-pressure chronic ulcer of other part of left foot limited to breakdown of skin: Secondary | ICD-10-CM

## 2017-08-04 DIAGNOSIS — E11621 Type 2 diabetes mellitus with foot ulcer: Secondary | ICD-10-CM

## 2017-08-04 MED ORDER — OXYCODONE-ACETAMINOPHEN 5-325 MG PO TABS: 1.0000 | ORAL_TABLET | Freq: Three times a day (TID) | ORAL | 0 refills | Status: DC | PRN

## 2017-08-04 NOTE — Progress Notes (Signed)
Office Visit Note   Patient: [name]           Date of Birth: 11/14/1965           MRN: [id number] Visit Date: 08/04/2017              Requested by: Lucia Gaskins, MD [address] [place], [place] [address] PCP: Lucia Gaskins, MD  Chief Complaint  Patient presents with  . Right Foot - Routine Post Op    06/09/17 right 2nd toe amputation  07/23/17 right 4th toe amputation       HPI: Patient is a 52 year old gentleman who was seen in follow-up for both feet.  He has a Wagner grade 1 ulcer beneath the first metatarsal head of the left foot. is status post second toe amputation right foot.  Assessment & Plan: Visit Diagnoses:  1. Ulcer of left foot, limited to breakdown of skin ([place])   2. Type 2 diabetes mellitus with left diabetic foot ulcer ([place])   3. H/O amputation of lesser toe, right (HCC)     Plan: Continue daily dial soap cleansing. Apply dry dressing. Minimize weight bearing bilateral feet.  A last prescription provided for Percocet.   Follow-Up Instructions: Return in about 2 weeks (around 08/18/2017).   Ortho Exam  Patient is alert, oriented, no adenopathy, well-dressed, normal affect, normal respiratory effort. Examination patient has palpable pulses he has a Wagner grade 1 ulcer beneath the first metatarsal head of the left foot.   the ulcer is 3 cm in diameter. this does not probe to bone or tendon. No erythema, drainage or wamrth. Right foot fourth toe incision is well approximated, healing well. No drainage, no erythema. No sign of infection.  Imaging: No results found. No images are attached to the encounter.  Labs: Lab Results  Component Value Date   HGBA1C 6.7 (H) 06/07/2017   HGBA1C 10.3 (H) 08/17/2016   HGBA1C 7.7 (H) 12/18/2013   ESRSEDRATE 5 06/04/2017   ESRSEDRATE 110 (H) 12/19/2013   ESRSEDRATE 104 (H) 12/18/2013   CRP <0.8 06/05/2017   CRP 12.2 (H) 12/19/2013   CRP 14.9 (H) 12/18/2013   REPTSTATUS 12/24/2013 FINAL 12/18/2013   REPTSTATUS 12/24/2013 FINAL 12/18/2013   GRAMSTAIN  04/19/2011    WBC PRESENT,BOTH PMN AND MONONUCLEAR NO ORGANISMS SEEN   CULT  12/18/2013    NO GROWTH 5 DAYS Performed at [facility]  12/18/2013    NO GROWTH 5 DAYS Performed at [facility]      Lab Results  Component Value Date   ALBUMIN 3.8 06/05/2017   ALBUMIN 4.3 12/14/2016   ALBUMIN 3.7 09/19/2016    Body mass index is 22.5 kg/m.  Orders:  No orders of the defined types were placed in this encounter.  Meds ordered this encounter  Medications  . oxyCODONE-acetaminophen (PERCOCET/ROXICET) 5-325 MG tablet    Sig: Take 1 tablet by mouth every 8 (eight) hours as needed.    Dispense:  20 tablet    Refill:  0     Procedures: No procedures performed  Clinical Data: No additional findings.  ROS:  All other systems negative, except as noted in the HPI. Review of Systems  Constitutional: Negative for chills and fever.  Skin: Positive for wound.    Objective: Vital Signs: Ht 6\' 3"  (1.905 m)   Wt 180 lb (81.6 kg)   BMI 22.50 kg/m   Specialty Comments:  No specialty comments available.  PMFS History: Patient  Active Problem List   Diagnosis Date Noted  . H/O amputation of lesser toe, right ([place]) 07/29/2017  . Diabetic foot infection ([place]) 06/22/2017  . Post op infection 06/22/2017  . SVT (supraventricular tachycardia) ([place]) 08/17/2016  . Tobacco use disorder 10/25/2014  . Diabetes mellitus type 2 in nonobese (HCC)   . Swelling   . Type 2 diabetes mellitus with left diabetic foot ulcer ([place]) 12/18/2013  . Diabetes mellitus due to underlying condition with foot ulcer ([place]) 12/18/2013  . Left leg cellulitis 12/18/2013  . Cocaine abuse ([place]) 03/29/2012  . Generalized anxiety disorder 03/29/2012  . Panic attacks 03/29/2012  . Alcohol dependence ([place]) 08/05/2011    Class: Chronic  . Substance abuse ([place]) 05/31/2011  . Ulcer of left foot, limited to breakdown of skin ([place])  02/15/2011  . Neuropathic ulcer of foot ([place]) 02/15/2011   Past Medical History:  Diagnosis Date  . Anxiety   . Arthritis    "everywhere" (04/21/2012)  . Cellulitis and abscess of foot 12/19/2013   LEFT FOOT  . Chronic pain   . DDD (degenerative disc disease)   . Depression   . Diabetes mellitus without complication (HCC)    borderline  . Diabetic foot ulcer ([place])   . ETOH abuse   . Full dentures   . GERD (gastroesophageal reflux disease)    tums  . History of blood transfusion    "related to left knee OR; probably right hip too" (04/21/2012)  . Mental disorder   . Neuromuscular disorder (HCC)    neuropathy  . Noncompliance   . Open wound    bottom of foot  . Osteomyelitis ([place])    right 4th toe  . Pneumonia ~ 2012  . Polysubstance abuse (HCC)    etoh, cocaine, heroin  . Stroke [facility]) 2008   "they said I might have had one during right hip replacement" (04/21/2012)  . Wears glasses     Family History  Problem Relation Age of Onset  . Diabetes Father     Past Surgical History:  Procedure Laterality Date  . AMPUTATION Right 06/09/2017   Procedure: RIGHT 2ND TOE AMPUTATION;  Surgeon: [name], MD;  Location: [facility];  Service: Orthopedics;  Laterality: Right;  . AMPUTATION Right 07/23/2017   Procedure: RIGHT 4TH TOE AMPUTATION;  Surgeon: [name], MD;  Location: [facility];  Service: Orthopedics;  Laterality: Right;  . JOINT REPLACEMENT    . KNEE ARTHROSCOPY Bilateral 1980's/1990's  . LUNG LOBECTOMY Left ~ 2006  . LUNG LOBECTOMY    . METATARSAL OSTEOTOMY  10/29/2011   Procedure: METATARSAL OSTEOTOMY;  Surgeon: [name], MD;  Location: [facility];  Service: Orthopedics;  Laterality: Left;  Left 1st Metatarsal Dorsal Closing Wedge   . METATARSAL OSTEOTOMY Right 04/28/2017   Procedure: RIGHT 1ST METATARSAL DORSAL CLOSING WEDGE OSTEOTOMY;  Surgeon: [name], MD;  Location: [facility];  Service: Orthopedics;  Laterality: Right;  . MULTIPLE TOOTH EXTRACTIONS    . REVISION  TOTAL HIP ARTHROPLASTY Right 2008   "4-5 months after replacement" (04/21/2012)  . TOTAL HIP ARTHROPLASTY Right 2008  . TOTAL KNEE ARTHROPLASTY Left 2006  . TOTAL KNEE ARTHROPLASTY Right 04/20/2012  . TOTAL KNEE ARTHROPLASTY Right 04/20/2012   Procedure: TOTAL KNEE ARTHROPLASTY;  Surgeon: [name], MD;  Location: [facility];  Service: Orthopedics;  Laterality: Right;  Right Total Knee Arthroplasty   Social History   Occupational History  . Not on file  Tobacco Use  . Smoking  status: Current Every Day Smoker    Packs/day: 1.00    Years: 30.00    Pack years: 30.00    Types: Cigarettes  . Smokeless tobacco: Never Used  Substance and Sexual Activity  . Alcohol use: No    Alcohol/week: 0.0 oz    Frequency: Never  . Drug use: Not Currently    Types: Cocaine    Comment: Cocaine and heroin   . Sexual activity: Yes    Birth control/protection: None

## 2017-08-19 ENCOUNTER — Ambulatory Visit (INDEPENDENT_AMBULATORY_CARE_PROVIDER_SITE_OTHER): Payer: Medicare Other | Admitting: Orthopedic Surgery

## 2017-08-19 DIAGNOSIS — Z89421 Acquired absence of other right toe(s): Secondary | ICD-10-CM | POA: Diagnosis not present

## 2017-08-19 DIAGNOSIS — E11621 Type 2 diabetes mellitus with foot ulcer: Secondary | ICD-10-CM

## 2017-08-19 DIAGNOSIS — L97521 Non-pressure chronic ulcer of other part of left foot limited to breakdown of skin: Secondary | ICD-10-CM

## 2017-08-19 DIAGNOSIS — L97529 Non-pressure chronic ulcer of other part of left foot with unspecified severity: Secondary | ICD-10-CM | POA: Diagnosis not present

## 2017-08-19 MED ORDER — OXYCODONE-ACETAMINOPHEN 5-325 MG PO TABS: 1.0000 | ORAL_TABLET | Freq: Three times a day (TID) | ORAL | 0 refills | Status: DC | PRN

## 2017-08-22 ENCOUNTER — Encounter (INDEPENDENT_AMBULATORY_CARE_PROVIDER_SITE_OTHER): Payer: Self-pay | Admitting: Orthopedic Surgery

## 2017-08-22 NOTE — Progress Notes (Signed)
Office Visit Note   Patient: [name]           Date of Birth: 03/10/1965           MRN: [id number] Visit Date: 08/19/2017              Requested by: Lucia Gaskins, MD [address] [place], [place] [address] PCP: Lucia Gaskins, MD  Chief Complaint  Patient presents with  . Right Foot - Routine Post Op      HPI: Patient is a 52 year old gentleman who presents 1 month status post right foot fourth ray amputation.  Patient also has a ulcer beneath the left foot first metatarsal head.  Assessment & Plan: Visit Diagnoses:  1. Type 2 diabetes mellitus with left diabetic foot ulcer ([place])   2. H/O amputation of lesser toe, right ([place])   3. Ulcer of left foot, limited to breakdown of skin ([place])     Plan: Ulcer was debrided of skin and soft tissue on the left foot.  Recommended orthotics discussed with the patient his narcotic profile with a score of 580 and that he has had 170 oxycodone tablets prescribed over the past month.  Denied request for refill prescription for Percocet today.  Follow-Up Instructions: Return in about 3 weeks (around 09/09/2017).   Ortho Exam  Patient is alert, oriented, no adenopathy, well-dressed, normal affect, normal respiratory effort. On examination patient's fourth ray amputation right foot is healed well there is no redness no cellulitis no drainage no signs of infection.  Patient has a Waggoner grade 1 ulcer beneath the left foot first metatarsal head.  After informed consent a 10 blade knife was used to debride the skin and soft tissue back to healthy viable granulation tissue.  The ulcer measures 2 x 4 cm and 3 mm deep.  Imaging: No results found. No images are attached to the encounter.  Labs: Lab Results  Component Value Date   HGBA1C 6.7 (H) 06/07/2017   HGBA1C 10.3 (H) 08/17/2016   HGBA1C 7.7 (H) 12/18/2013   ESRSEDRATE 5 06/04/2017   ESRSEDRATE 110 (H) 12/19/2013   ESRSEDRATE 104 (H) 12/18/2013   CRP <0.8 06/05/2017   CRP  12.2 (H) 12/19/2013   CRP 14.9 (H) 12/18/2013   REPTSTATUS 12/24/2013 FINAL 12/18/2013   REPTSTATUS 12/24/2013 FINAL 12/18/2013   GRAMSTAIN  04/19/2011    WBC PRESENT,BOTH PMN AND MONONUCLEAR NO ORGANISMS SEEN   CULT  12/18/2013    NO GROWTH 5 DAYS Performed at [facility]  12/18/2013    NO GROWTH 5 DAYS Performed at [facility]      Lab Results  Component Value Date   ALBUMIN 3.8 06/05/2017   ALBUMIN 4.3 12/14/2016   ALBUMIN 3.7 09/19/2016    There is no height or weight on file to calculate BMI.  Orders:  No orders of the defined types were placed in this encounter.  Meds ordered this encounter  Medications  . oxyCODONE-acetaminophen (PERCOCET/ROXICET) 5-325 MG tablet    Sig: Take 1 tablet by mouth every 8 (eight) hours as needed.    Dispense:  20 tablet    Refill:  0     Procedures: No procedures performed  Clinical Data: No additional findings.  ROS:  All other systems negative, except as noted in the HPI. Review of Systems  Objective: Vital Signs: There were no vitals taken for this visit.  Specialty Comments:  No specialty comments available.  PMFS History: Patient Active Problem  List   Diagnosis Date Noted  . H/O amputation of lesser toe, right ([place]) 07/29/2017  . Diabetic foot infection ([place]) 06/22/2017  . Post op infection 06/22/2017  . SVT (supraventricular tachycardia) ([place]) 08/17/2016  . Tobacco use disorder 10/25/2014  . Diabetes mellitus type 2 in nonobese (HCC)   . Swelling   . Type 2 diabetes mellitus with left diabetic foot ulcer ([place]) 12/18/2013  . Diabetes mellitus due to underlying condition with foot ulcer ([place]) 12/18/2013  . Left leg cellulitis 12/18/2013  . Cocaine abuse ([place]) 03/29/2012  . Generalized anxiety disorder 03/29/2012  . Panic attacks 03/29/2012  . Alcohol dependence ([place]) 08/05/2011    Class: Chronic  . Substance abuse ([place]) 05/31/2011  . Ulcer of left foot, limited to breakdown of  skin ([place]) 02/15/2011  . Neuropathic ulcer of foot ([place]) 02/15/2011   Past Medical History:  Diagnosis Date  . Anxiety   . Arthritis    "everywhere" (04/21/2012)  . Cellulitis and abscess of foot 12/19/2013   LEFT FOOT  . Chronic pain   . DDD (degenerative disc disease)   . Depression   . Diabetes mellitus without complication (HCC)    borderline  . Diabetic foot ulcer ([place])   . ETOH abuse   . Full dentures   . GERD (gastroesophageal reflux disease)    tums  . History of blood transfusion    "related to left knee OR; probably right hip too" (04/21/2012)  . Mental disorder   . Neuromuscular disorder (HCC)    neuropathy  . Noncompliance   . Open wound    bottom of foot  . Osteomyelitis ([place])    right 4th toe  . Pneumonia ~ 2012  . Polysubstance abuse (HCC)    etoh, cocaine, heroin  . Stroke [facility]) 2008   "they said I might have had one during right hip replacement" (04/21/2012)  . Wears glasses     Family History  Problem Relation Age of Onset  . Diabetes Father     Past Surgical History:  Procedure Laterality Date  . AMPUTATION Right 06/09/2017   Procedure: RIGHT 2ND TOE AMPUTATION;  Surgeon: [name], MD;  Location: [facility];  Service: Orthopedics;  Laterality: Right;  . AMPUTATION Right 07/23/2017   Procedure: RIGHT 4TH TOE AMPUTATION;  Surgeon: [name], MD;  Location: [facility];  Service: Orthopedics;  Laterality: Right;  . JOINT REPLACEMENT    . KNEE ARTHROSCOPY Bilateral 1980's/1990's  . LUNG LOBECTOMY Left ~ 2006  . LUNG LOBECTOMY    . METATARSAL OSTEOTOMY  10/29/2011   Procedure: METATARSAL OSTEOTOMY;  Surgeon: [name], MD;  Location: [facility];  Service: Orthopedics;  Laterality: Left;  Left 1st Metatarsal Dorsal Closing Wedge   . METATARSAL OSTEOTOMY Right 04/28/2017   Procedure: RIGHT 1ST METATARSAL DORSAL CLOSING WEDGE OSTEOTOMY;  Surgeon: [name], MD;  Location: [facility];  Service: Orthopedics;  Laterality: Right;  . MULTIPLE TOOTH EXTRACTIONS    .  REVISION TOTAL HIP ARTHROPLASTY Right 2008   "4-5 months after replacement" (04/21/2012)  . TOTAL HIP ARTHROPLASTY Right 2008  . TOTAL KNEE ARTHROPLASTY Left 2006  . TOTAL KNEE ARTHROPLASTY Right 04/20/2012  . TOTAL KNEE ARTHROPLASTY Right 04/20/2012   Procedure: TOTAL KNEE ARTHROPLASTY;  Surgeon: [name], MD;  Location: [facility];  Service: Orthopedics;  Laterality: Right;  Right Total Knee Arthroplasty   Social History   Occupational History  . Not on file  Tobacco Use  . Smoking status: Current  Every Day Smoker    Packs/day: 1.00    Years: 30.00    Pack years: 30.00    Types: Cigarettes  . Smokeless tobacco: Never Used  Substance and Sexual Activity  . Alcohol use: No    Alcohol/week: 0.0 oz    Frequency: Never  . Drug use: Not Currently    Types: Cocaine    Comment: Cocaine and heroin   . Sexual activity: Yes    Birth control/protection: None

## 2017-09-08 ENCOUNTER — Ambulatory Visit (INDEPENDENT_AMBULATORY_CARE_PROVIDER_SITE_OTHER): Payer: Medicare Other | Admitting: Orthopedic Surgery

## 2017-09-08 ENCOUNTER — Encounter (INDEPENDENT_AMBULATORY_CARE_PROVIDER_SITE_OTHER): Payer: Self-pay | Admitting: Orthopedic Surgery

## 2017-09-08 VITALS — Ht 75.0 in | Wt 180.0 lb

## 2017-09-08 DIAGNOSIS — L97521 Non-pressure chronic ulcer of other part of left foot limited to breakdown of skin: Secondary | ICD-10-CM

## 2017-09-08 DIAGNOSIS — Z89421 Acquired absence of other right toe(s): Secondary | ICD-10-CM

## 2017-09-08 NOTE — Progress Notes (Signed)
Office Visit Note   Patient: [name]           Date of Birth: 02-21-1965           MRN: [id number] Visit Date: 09/08/2017              Requested by: Lucia Gaskins, MD [address] [place], [place] [address] PCP: Lucia Gaskins, MD  Chief Complaint  Patient presents with  . Right Foot - Routine Post Op    06/09/17 right 2nd toe amputation       HPI: Patient is a 51 year old gentleman who presents in follow-up for both lower extremities.  He is status post multiple toe amputations on the right foot and a Waggoner grade 1 ulcer beneath the left foot first metatarsal head.  Assessment & Plan: Visit Diagnoses:  1. H/O amputation of lesser toe, right ([place])   2. Ulcer of left foot, limited to breakdown of skin ([place])     Plan: Ulcer was debrided of skin and soft tissue Iodosorb 4 x 4 and a Band-Aid was applied.  Patient was given a prescription for [facility] for custom orthotics with posting proximal to the metatarsal heads to unload the metatarsal heads on the left foot.  Follow-Up Instructions: Return in about 4 weeks (around 10/06/2017).   Ortho Exam  Patient is alert, oriented, no adenopathy, well-dressed, normal affect, normal respiratory effort. Examination patient has an antalgic gait he has palpable pulses the right foot is well-healed with no redness no cellulitis no drainage.  Examination the left foot he has a Waggoner grade 1 ulcer beneath the first metatarsal head he has good pulses.  After informed consent a 10 blade knife was used to debride the skin and soft tissue back to healthy viable granulation tissue this was touched with silver nitrate the ulcer is 3 cm in diameter and 3 mm deep after debridement.  Iodosorb 4 x 4 and a Band-Aid was applied.  Imaging: No results found. No images are attached to the encounter.  Labs: Lab Results  Component Value Date   HGBA1C 6.7 (H) 06/07/2017   HGBA1C 10.3 (H) 08/17/2016   HGBA1C 7.7 (H) 12/18/2013   ESRSEDRATE  5 06/04/2017   ESRSEDRATE 110 (H) 12/19/2013   ESRSEDRATE 104 (H) 12/18/2013   CRP <0.8 06/05/2017   CRP 12.2 (H) 12/19/2013   CRP 14.9 (H) 12/18/2013   REPTSTATUS 12/24/2013 FINAL 12/18/2013   REPTSTATUS 12/24/2013 FINAL 12/18/2013   GRAMSTAIN  04/19/2011    WBC PRESENT,BOTH PMN AND MONONUCLEAR NO ORGANISMS SEEN   CULT  12/18/2013    NO GROWTH 5 DAYS Performed at [facility]  12/18/2013    NO GROWTH 5 DAYS Performed at [facility]      Lab Results  Component Value Date   ALBUMIN 3.8 06/05/2017   ALBUMIN 4.3 12/14/2016   ALBUMIN 3.7 09/19/2016    Body mass index is 22.5 kg/m.  Orders:  No orders of the defined types were placed in this encounter.  No orders of the defined types were placed in this encounter.    Procedures: No procedures performed  Clinical Data: No additional findings.  ROS:  All other systems negative, except as noted in the HPI. Review of Systems  Objective: Vital Signs: Ht 6\' 3"  (1.905 m)   Wt 180 lb (81.6 kg)   BMI 22.50 kg/m   Specialty Comments:  No specialty comments available.  PMFS History: Patient Active Problem List   Diagnosis  Date Noted  . H/O amputation of lesser toe, right ([place]) 07/29/2017  . Diabetic foot infection ([place]) 06/22/2017  . Post op infection 06/22/2017  . SVT (supraventricular tachycardia) ([place]) 08/17/2016  . Tobacco use disorder 10/25/2014  . Diabetes mellitus type 2 in nonobese (HCC)   . Swelling   . Type 2 diabetes mellitus with left diabetic foot ulcer ([place]) 12/18/2013  . Diabetes mellitus due to underlying condition with foot ulcer ([place]) 12/18/2013  . Left leg cellulitis 12/18/2013  . Cocaine abuse ([place]) 03/29/2012  . Generalized anxiety disorder 03/29/2012  . Panic attacks 03/29/2012  . Alcohol dependence ([place]) 08/05/2011    Class: Chronic  . Substance abuse ([place]) 05/31/2011  . Ulcer of left foot, limited to breakdown of skin ([place]) 02/15/2011  . Neuropathic ulcer  of foot ([place]) 02/15/2011   Past Medical History:  Diagnosis Date  . Anxiety   . Arthritis    "everywhere" (04/21/2012)  . Cellulitis and abscess of foot 12/19/2013   LEFT FOOT  . Chronic pain   . DDD (degenerative disc disease)   . Depression   . Diabetes mellitus without complication (HCC)    borderline  . Diabetic foot ulcer ([place])   . ETOH abuse   . Full dentures   . GERD (gastroesophageal reflux disease)    tums  . History of blood transfusion    "related to left knee OR; probably right hip too" (04/21/2012)  . Mental disorder   . Neuromuscular disorder (HCC)    neuropathy  . Noncompliance   . Open wound    bottom of foot  . Osteomyelitis ([place])    right 4th toe  . Pneumonia ~ 2012  . Polysubstance abuse (HCC)    etoh, cocaine, heroin  . Stroke [facility]) 2008   "they said I might have had one during right hip replacement" (04/21/2012)  . Wears glasses     Family History  Problem Relation Age of Onset  . Diabetes Father     Past Surgical History:  Procedure Laterality Date  . AMPUTATION Right 06/09/2017   Procedure: RIGHT 2ND TOE AMPUTATION;  Surgeon: [name], MD;  Location: [facility];  Service: Orthopedics;  Laterality: Right;  . AMPUTATION Right 07/23/2017   Procedure: RIGHT 4TH TOE AMPUTATION;  Surgeon: [name], MD;  Location: [facility];  Service: Orthopedics;  Laterality: Right;  . JOINT REPLACEMENT    . KNEE ARTHROSCOPY Bilateral 1980's/1990's  . LUNG LOBECTOMY Left ~ 2006  . LUNG LOBECTOMY    . METATARSAL OSTEOTOMY  10/29/2011   Procedure: METATARSAL OSTEOTOMY;  Surgeon: [name], MD;  Location: [facility];  Service: Orthopedics;  Laterality: Left;  Left 1st Metatarsal Dorsal Closing Wedge   . METATARSAL OSTEOTOMY Right 04/28/2017   Procedure: RIGHT 1ST METATARSAL DORSAL CLOSING WEDGE OSTEOTOMY;  Surgeon: [name], MD;  Location: [facility];  Service: Orthopedics;  Laterality: Right;  . MULTIPLE TOOTH EXTRACTIONS    . REVISION TOTAL HIP ARTHROPLASTY Right 2008     "4-5 months after replacement" (04/21/2012)  . TOTAL HIP ARTHROPLASTY Right 2008  . TOTAL KNEE ARTHROPLASTY Left 2006  . TOTAL KNEE ARTHROPLASTY Right 04/20/2012  . TOTAL KNEE ARTHROPLASTY Right 04/20/2012   Procedure: TOTAL KNEE ARTHROPLASTY;  Surgeon: [name], MD;  Location: [facility];  Service: Orthopedics;  Laterality: Right;  Right Total Knee Arthroplasty   Social History   Occupational History  . Not on file  Tobacco Use  . Smoking status: Current Every Day Smoker  Packs/day: 1.00    Years: 30.00    Pack years: 30.00    Types: Cigarettes  . Smokeless tobacco: Never Used  Substance and Sexual Activity  . Alcohol use: No    Alcohol/week: 0.0 standard drinks    Frequency: Never  . Drug use: Not Currently    Types: Cocaine    Comment: Cocaine and heroin   . Sexual activity: Yes    Birth control/protection: None

## 2017-09-30 ENCOUNTER — Ambulatory Visit (INDEPENDENT_AMBULATORY_CARE_PROVIDER_SITE_OTHER): Payer: Medicare Other | Admitting: Physician Assistant

## 2017-09-30 ENCOUNTER — Encounter (INDEPENDENT_AMBULATORY_CARE_PROVIDER_SITE_OTHER): Payer: Self-pay | Admitting: Orthopedic Surgery

## 2017-09-30 DIAGNOSIS — E11621 Type 2 diabetes mellitus with foot ulcer: Secondary | ICD-10-CM | POA: Diagnosis not present

## 2017-09-30 DIAGNOSIS — L97529 Non-pressure chronic ulcer of other part of left foot with unspecified severity: Secondary | ICD-10-CM

## 2017-09-30 DIAGNOSIS — L97521 Non-pressure chronic ulcer of other part of left foot limited to breakdown of skin: Secondary | ICD-10-CM

## 2017-09-30 DIAGNOSIS — Z89421 Acquired absence of other right toe(s): Secondary | ICD-10-CM

## 2017-09-30 NOTE — Progress Notes (Signed)
Office Visit Note   Patient: [name]           Date of Birth: 1965/05/24           MRN: [id number] Visit Date: 09/30/2017              Requested by: Lucia Gaskins, MD [address] [place], [place] [address] PCP: Lucia Gaskins, MD  Chief Complaint  Patient presents with  . Left Foot - Follow-up, Wound Check  . Right Foot - Follow-up      HPI: Patient is a 52 year old male who is seen in follow-up for a Waggoner grade 1 ulcer between the left foot plantar surface first metatarsal head and he is status post multiple toe amputations on the right foot.  He reports the right foot is doing well and he has had no difficulties with this.  He continues to have a ulcer over the plantar surface of the left foot is gradually getting better.  He was given a prescription for custom orthotics and is trying to have these made somewhere in the [place] area.  The patient did request more Percocet and we discussed that we cannot give any further prescriptions as he is nearly 3-1/2 months out from his last right great toe amputation.  If he is having chronic issues with pain management we can offer him a referral to a pain management program.  Assessment & Plan: Visit Diagnoses:  1. Ulcer of left foot, limited to breakdown of skin ([place])   2. Type 2 diabetes mellitus with left diabetic foot ulcer ([place])   3. H/O amputation of lesser toe, right (HCC)     Plan: The left plantar first metatarsal head ulcer was debrided sharply with a #10 blade after informed consent and the patient tolerated this well.  He is healing well following his previous amputations over the right foot and is now more than 3 months postop.  We will see him in follow-up in about 3 weeks or sooner should he have any difficulties in the interim. Counseled patient that if he did like a referral for chronic pain management that we will be happy to provide this for him and he declined at this point.  Follow-Up  Instructions: Return in about 3 weeks (around 10/21/2017).   Ortho Exam  Patient is alert, oriented, no adenopathy, well-dressed, normal affect, normal respiratory effort. Right foot shows well-healed toe amputations without signs of cellulitis.  There is no edema. Left plantar first metatarsal head ulcer with surrounding callus was debrided.  Following debridement the ulcer measures 5 x 3 x 3 mm.  There is no exposed tendon or bone. Imaging: No results found. No images are attached to the encounter.  Labs: Lab Results  Component Value Date   HGBA1C 6.7 (H) 06/07/2017   HGBA1C 10.3 (H) 08/17/2016   HGBA1C 7.7 (H) 12/18/2013   ESRSEDRATE 5 06/04/2017   ESRSEDRATE 110 (H) 12/19/2013   ESRSEDRATE 104 (H) 12/18/2013   CRP <0.8 06/05/2017   CRP 12.2 (H) 12/19/2013   CRP 14.9 (H) 12/18/2013   REPTSTATUS 12/24/2013 FINAL 12/18/2013   REPTSTATUS 12/24/2013 FINAL 12/18/2013   GRAMSTAIN  04/19/2011    WBC PRESENT,BOTH PMN AND MONONUCLEAR NO ORGANISMS SEEN   CULT  12/18/2013    NO GROWTH 5 DAYS Performed at [facility]  12/18/2013    NO GROWTH 5 DAYS Performed at [facility]      Lab Results  Component Value Date  ALBUMIN 3.8 06/05/2017   ALBUMIN 4.3 12/14/2016   ALBUMIN 3.7 09/19/2016    There is no height or weight on file to calculate BMI.  Orders:  No orders of the defined types were placed in this encounter.  No orders of the defined types were placed in this encounter.    Procedures: No procedures performed  Clinical Data: No additional findings.  ROS:  All other systems negative, except as noted in the HPI. Review of Systems  Objective: Vital Signs: There were no vitals taken for this visit.  Specialty Comments:  No specialty comments available.  PMFS History: Patient Active Problem List   Diagnosis Date Noted  . H/O amputation of lesser toe, right ([place]) 07/29/2017  . Diabetic foot infection ([place]) 06/22/2017  . Post op  infection 06/22/2017  . SVT (supraventricular tachycardia) ([place]) 08/17/2016  . Tobacco use disorder 10/25/2014  . Diabetes mellitus type 2 in nonobese (HCC)   . Swelling   . Type 2 diabetes mellitus with left diabetic foot ulcer ([place]) 12/18/2013  . Diabetes mellitus due to underlying condition with foot ulcer ([place]) 12/18/2013  . Left leg cellulitis 12/18/2013  . Cocaine abuse ([place]) 03/29/2012  . Generalized anxiety disorder 03/29/2012  . Panic attacks 03/29/2012  . Alcohol dependence ([place]) 08/05/2011    Class: Chronic  . Substance abuse ([place]) 05/31/2011  . Ulcer of left foot, limited to breakdown of skin ([place]) 02/15/2011  . Neuropathic ulcer of foot ([place]) 02/15/2011   Past Medical History:  Diagnosis Date  . Anxiety   . Arthritis    "everywhere" (04/21/2012)  . Cellulitis and abscess of foot 12/19/2013   LEFT FOOT  . Chronic pain   . DDD (degenerative disc disease)   . Depression   . Diabetes mellitus without complication (HCC)    borderline  . Diabetic foot ulcer ([place])   . ETOH abuse   . Full dentures   . GERD (gastroesophageal reflux disease)    tums  . History of blood transfusion    "related to left knee OR; probably right hip too" (04/21/2012)  . Mental disorder   . Neuromuscular disorder (HCC)    neuropathy  . Noncompliance   . Open wound    bottom of foot  . Osteomyelitis ([place])    right 4th toe  . Pneumonia ~ 2012  . Polysubstance abuse (HCC)    etoh, cocaine, heroin  . Stroke [facility]) 2008   "they said I might have had one during right hip replacement" (04/21/2012)  . Wears glasses     Family History  Problem Relation Age of Onset  . Diabetes Father     Past Surgical History:  Procedure Laterality Date  . AMPUTATION Right 06/09/2017   Procedure: RIGHT 2ND TOE AMPUTATION;  Surgeon: [name], MD;  Location: [facility];  Service: Orthopedics;  Laterality: Right;  . AMPUTATION Right 07/23/2017   Procedure: RIGHT 4TH TOE AMPUTATION;  Surgeon: [name], MD;   Location: [facility];  Service: Orthopedics;  Laterality: Right;  . JOINT REPLACEMENT    . KNEE ARTHROSCOPY Bilateral 1980's/1990's  . LUNG LOBECTOMY Left ~ 2006  . LUNG LOBECTOMY    . METATARSAL OSTEOTOMY  10/29/2011   Procedure: METATARSAL OSTEOTOMY;  Surgeon: [name], MD;  Location: [facility];  Service: Orthopedics;  Laterality: Left;  Left 1st Metatarsal Dorsal Closing Wedge   . METATARSAL OSTEOTOMY Right 04/28/2017   Procedure: RIGHT 1ST METATARSAL DORSAL CLOSING WEDGE OSTEOTOMY;  Surgeon: [name], MD;  Location: [facility];  Service: Orthopedics;  Laterality: Right;  . MULTIPLE TOOTH EXTRACTIONS    . REVISION TOTAL HIP ARTHROPLASTY Right 2008   "4-5 months after replacement" (04/21/2012)  . TOTAL HIP ARTHROPLASTY Right 2008  . TOTAL KNEE ARTHROPLASTY Left 2006  . TOTAL KNEE ARTHROPLASTY Right 04/20/2012  . TOTAL KNEE ARTHROPLASTY Right 04/20/2012   Procedure: TOTAL KNEE ARTHROPLASTY;  Surgeon: [name], MD;  Location: [facility];  Service: Orthopedics;  Laterality: Right;  Right Total Knee Arthroplasty   Social History   Occupational History  . Not on file  Tobacco Use  . Smoking status: Current Every Day Smoker    Packs/day: 1.00    Years: 30.00    Pack years: 30.00    Types: Cigarettes  . Smokeless tobacco: Never Used  Substance and Sexual Activity  . Alcohol use: No    Alcohol/week: 0.0 standard drinks    Frequency: Never  . Drug use: Not Currently    Types: Cocaine    Comment: Cocaine and heroin   . Sexual activity: Yes    Birth control/protection: None

## 2017-10-28 ENCOUNTER — Ambulatory Visit (INDEPENDENT_AMBULATORY_CARE_PROVIDER_SITE_OTHER): Payer: Medicare Other | Admitting: Physician Assistant

## 2017-10-28 ENCOUNTER — Encounter (INDEPENDENT_AMBULATORY_CARE_PROVIDER_SITE_OTHER): Payer: Self-pay | Admitting: Orthopedic Surgery

## 2017-10-28 VITALS — Ht 75.0 in | Wt 180.0 lb

## 2017-10-28 DIAGNOSIS — L97529 Non-pressure chronic ulcer of other part of left foot with unspecified severity: Secondary | ICD-10-CM

## 2017-10-28 DIAGNOSIS — Z89421 Acquired absence of other right toe(s): Secondary | ICD-10-CM | POA: Diagnosis not present

## 2017-10-28 DIAGNOSIS — L97521 Non-pressure chronic ulcer of other part of left foot limited to breakdown of skin: Secondary | ICD-10-CM

## 2017-10-28 DIAGNOSIS — E11621 Type 2 diabetes mellitus with foot ulcer: Secondary | ICD-10-CM | POA: Diagnosis not present

## 2017-10-28 NOTE — Progress Notes (Signed)
Office Visit Note   Patient: [name]           Date of Birth: [birth year]           MRN: [id number] Visit Date: 10/28/2017              Requested by: Lucia Gaskins, MD [address] [place], [place] [address] PCP: Lucia Gaskins, MD  Chief Complaint  Patient presents with  . Right Foot - Follow-up    4th toe amp  . Left Foot - Wound Check      HPI: The patient is a 52 year old male who presents for follow-up of his W[place] 1 left diabetic foot ulcer.  He he has been able to get custom orthotics ordered and reports that he should receive them over the next few weeks.  Assessment & Plan: Visit Diagnoses:  1. Ulcer of left foot, limited to breakdown of skin ([place])   2. Type 2 diabetes mellitus with left diabetic foot ulcer ([place])   3. H/O amputation of lesser toe, right (HCC)     Plan: After informed consent the left ulcers over the first metatarsal head was debrided with a #10 blade knife for skin and soft tissue to good viable tissue and the patient tolerated this well.  He will follow-up in 4 weeks.  As noted he is obtaining custom orthotics and hopefully should have them over the next several weeks.  Follow-Up Instructions: Return in about 4 weeks (around 11/25/2017).   Ortho Exam  Patient is alert, oriented, no adenopathy, well-dressed, normal affect, normal respiratory effort. The left foot ulcer over the first metatarsal head plantar surface was sharply debrided with a #10 blade knife after informed consent and the patient tolerated this well.  Following debridement the ulcer measures 5 x 3 x 3 mm.  There is no exposed bone or tendon.  There are no signs of cellulitis. The right foot shows well-healed toe amputations without signs of cellulitis.  There is no edema. Imaging: No results found. No images are attached to the encounter.  Labs: Lab Results  Component Value Date   HGBA1C 6.7 (H) 06/07/2017   HGBA1C 10.3 (H) 08/17/2016   HGBA1C 7.7 (H)  12/18/2013   ESRSEDRATE 5 06/04/2017   ESRSEDRATE 110 (H) 12/19/2013   ESRSEDRATE 104 (H) 12/18/2013   CRP <0.8 06/05/2017   CRP 12.2 (H) 12/19/2013   CRP 14.9 (H) 12/18/2013   REPTSTATUS 12/24/2013 FINAL 12/18/2013   REPTSTATUS 12/24/2013 FINAL 12/18/2013   GRAMSTAIN  04/19/2011    WBC PRESENT,BOTH PMN AND MONONUCLEAR NO ORGANISMS SEEN   CULT  12/18/2013    NO GROWTH 5 DAYS Performed at [facility]  12/18/2013    NO GROWTH 5 DAYS Performed at [facility]      Lab Results  Component Value Date   ALBUMIN 3.8 06/05/2017   ALBUMIN 4.3 12/14/2016   ALBUMIN 3.7 09/19/2016    Body mass index is 22.5 kg/m.  Orders:  No orders of the defined types were placed in this encounter.  No orders of the defined types were placed in this encounter.    Procedures: No procedures performed  Clinical Data: No additional findings.  ROS:  All other systems negative, except as noted in the HPI. Review of Systems  Objective: Vital Signs: Ht 6\' 3"  (1.905 m)   Wt 180 lb (81.6 kg)   BMI 22.50 kg/m   Specialty Comments:  No specialty comments available.  PMFS History:  Patient Active Problem List   Diagnosis Date Noted  . H/O amputation of lesser toe, right ([place]) 07/29/2017  . Diabetic foot infection ([place]) 06/22/2017  . Post op infection 06/22/2017  . SVT (supraventricular tachycardia) ([place]) 08/17/2016  . Tobacco use disorder 10/25/2014  . Diabetes mellitus type 2 in nonobese (HCC)   . Swelling   . Type 2 diabetes mellitus with left diabetic foot ulcer ([place]) 12/18/2013  . Diabetes mellitus due to underlying condition with foot ulcer ([place]) 12/18/2013  . Left leg cellulitis 12/18/2013  . Cocaine abuse ([place]) 03/29/2012  . Generalized anxiety disorder 03/29/2012  . Panic attacks 03/29/2012  . Alcohol dependence ([place]) 08/05/2011    Class: Chronic  . Substance abuse ([place]) 05/31/2011  . Ulcer of left foot, limited to breakdown of skin ([place])  02/15/2011  . Neuropathic ulcer of foot ([place]) 02/15/2011   Past Medical History:  Diagnosis Date  . Anxiety   . Arthritis    "everywhere" (04/21/2012)  . Cellulitis and abscess of foot 12/19/2013   LEFT FOOT  . Chronic pain   . DDD (degenerative disc disease)   . Depression   . Diabetes mellitus without complication (HCC)    borderline  . Diabetic foot ulcer ([place])   . ETOH abuse   . Full dentures   . GERD (gastroesophageal reflux disease)    tums  . History of blood transfusion    "related to left knee OR; probably right hip too" (04/21/2012)  . Mental disorder   . Neuromuscular disorder (HCC)    neuropathy  . Noncompliance   . Open wound    bottom of foot  . Osteomyelitis ([place])    right 4th toe  . Pneumonia ~ 2012  . Polysubstance abuse (HCC)    etoh, cocaine, heroin  . Stroke [facility]) 2008   "they said I might have had one during right hip replacement" (04/21/2012)  . Wears glasses     Family History  Problem Relation Age of Onset  . Diabetes Father     Past Surgical History:  Procedure Laterality Date  . AMPUTATION Right 06/09/2017   Procedure: RIGHT 2ND TOE AMPUTATION;  Surgeon: [name], MD;  Location: [place];  Service: Orthopedics;  Laterality: Right;  . AMPUTATION Right 07/23/2017   Procedure: RIGHT 4TH TOE AMPUTATION;  Surgeon: [name], MD;  Location: [place];  Service: Orthopedics;  Laterality: Right;  . JOINT REPLACEMENT    . KNEE ARTHROSCOPY Bilateral 1980's/1990's  . LUNG LOBECTOMY Left ~ 2006  . LUNG LOBECTOMY    . METATARSAL OSTEOTOMY  10/29/2011   Procedure: METATARSAL OSTEOTOMY;  Surgeon: [name], MD;  Location: [place];  Service: Orthopedics;  Laterality: Left;  Left 1st Metatarsal Dorsal Closing Wedge   . METATARSAL OSTEOTOMY Right 04/28/2017   Procedure: RIGHT 1ST METATARSAL DORSAL CLOSING WEDGE OSTEOTOMY;  Surgeon: [name], MD;  Location: [facility];  Service: Orthopedics;  Laterality: Right;  . MULTIPLE TOOTH EXTRACTIONS    . REVISION  TOTAL HIP ARTHROPLASTY Right 2008   "4-5 months after replacement" (04/21/2012)  . TOTAL HIP ARTHROPLASTY Right 2008  . TOTAL KNEE ARTHROPLASTY Left 2006  . TOTAL KNEE ARTHROPLASTY Right 04/20/2012  . TOTAL KNEE ARTHROPLASTY Right 04/20/2012   Procedure: TOTAL KNEE ARTHROPLASTY;  Surgeon: [name], MD;  Location: [facility];  Service: Orthopedics;  Laterality: Right;  Right Total Knee Arthroplasty   Social History   Occupational History  . Not on file  Tobacco Use  .  Smoking status: Current Every Day Smoker    Packs/day: 1.00    Years: 30.00    Pack years: 30.00    Types: Cigarettes  . Smokeless tobacco: Never Used  Substance and Sexual Activity  . Alcohol use: No    Alcohol/week: 0.0 standard drinks    Frequency: Never  . Drug use: Not Currently    Types: Cocaine    Comment: Cocaine and heroin   . Sexual activity: Yes    Birth control/protection: None

## 2017-10-30 IMAGING — CT CT RENAL STONE PROTOCOL
2 of 4 series · 16 of 46 positions shown, 18 images · non-contrast
Comparison: 11/07/2009

CLINICAL DATA: Acute onset right-sided flank pain today. Syncopal
episode.

EXAM:
CT ABDOMEN AND PELVIS WITHOUT CONTRAST
TECHNIQUE: Multidetector CT imaging of the abdomen and pelvis was performed
following the standard protocol without IV contrast.

[Series 2: standard/full over (age)lbs 5.0 · axial · 0.82mm/px · z∈[-731,-226]mm · 13 of 111 slices shown, 15 images]
[im 5/111  soft-tissue]
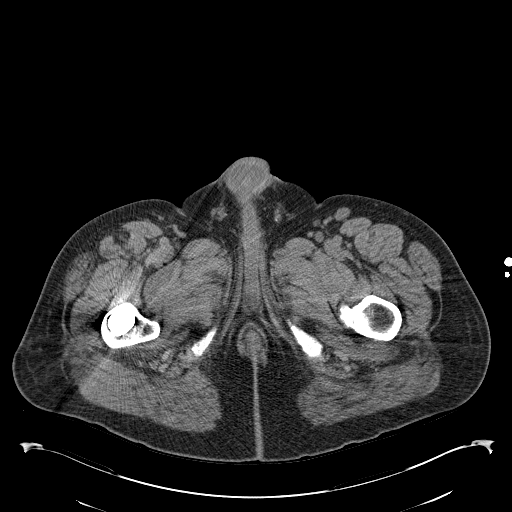
[im 5/111  bone]
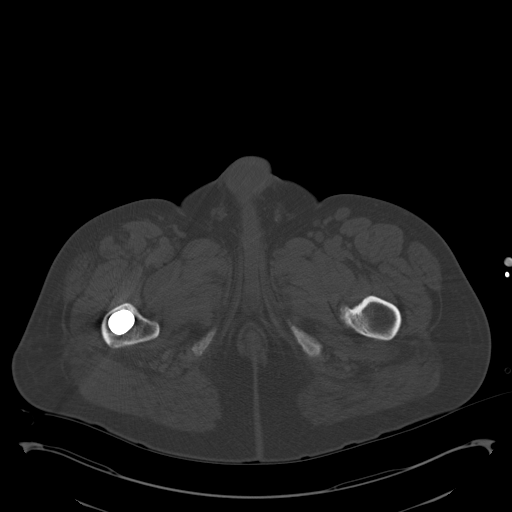
[im 15/111  soft-tissue]
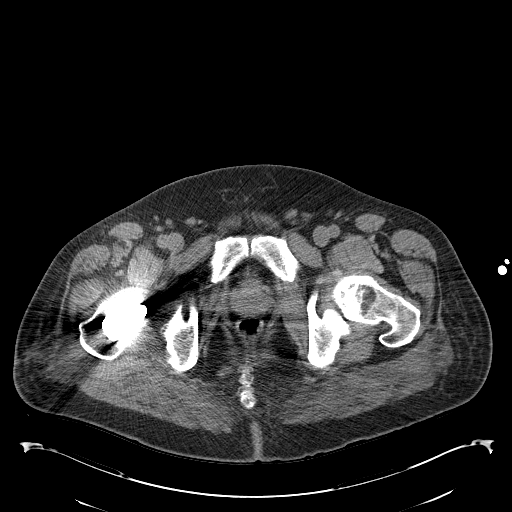
[im 24/111  soft-tissue]
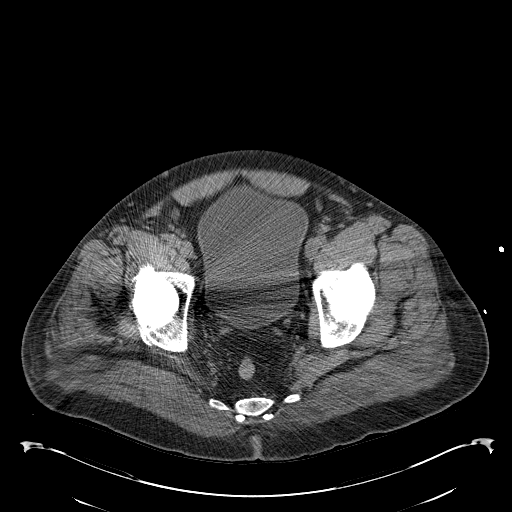
[im 29/111  soft-tissue]
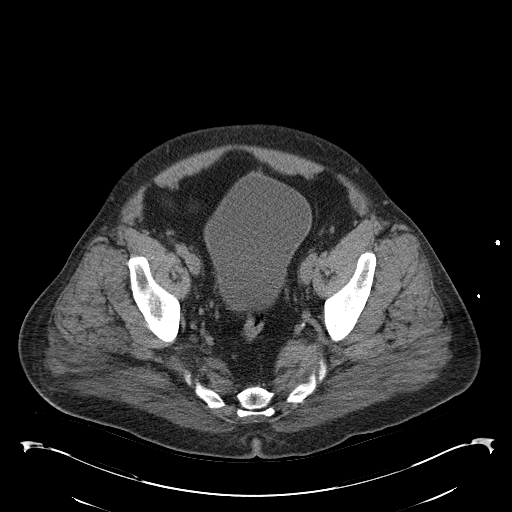
[im 39/111  soft-tissue]
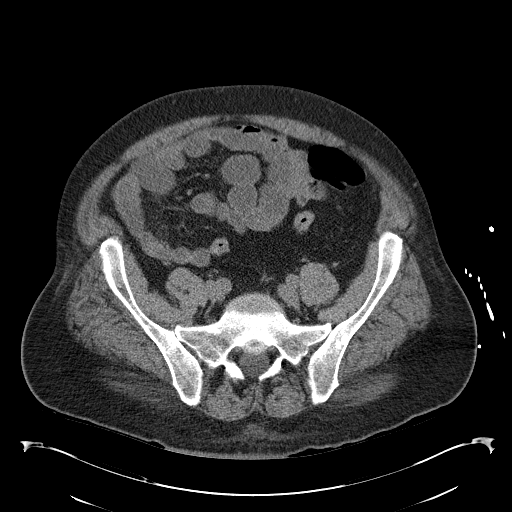
[im 48/111  soft-tissue]
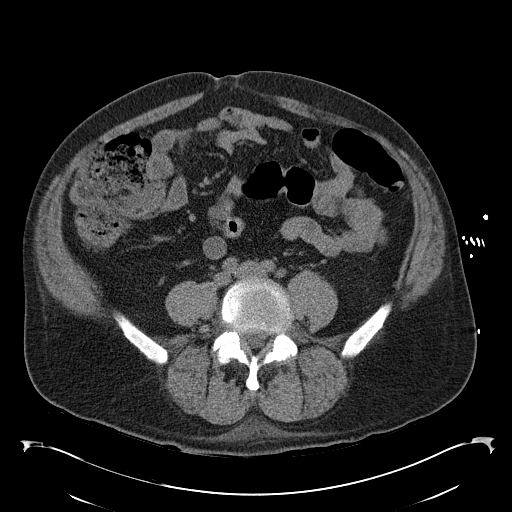
[im 58/111  soft-tissue]
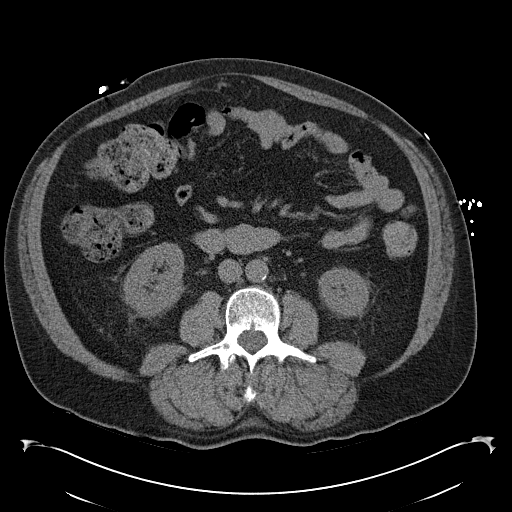
[im 63/111  soft-tissue]
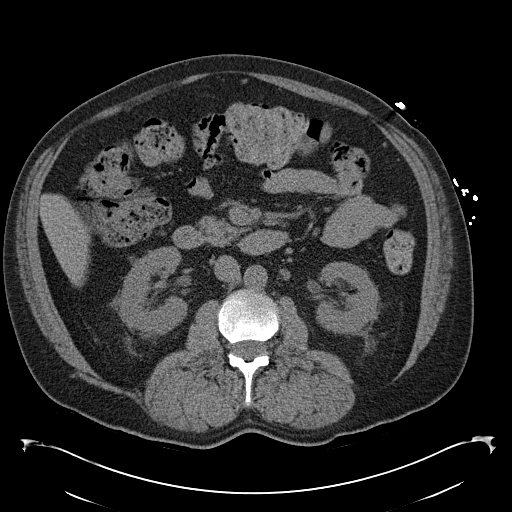
[im 72/111  soft-tissue]
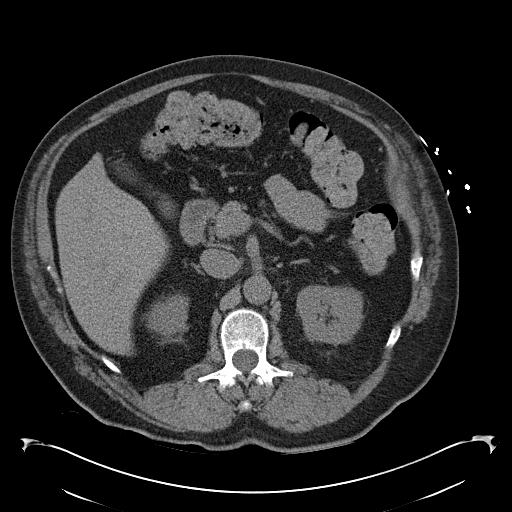
[im 72/111  bone]
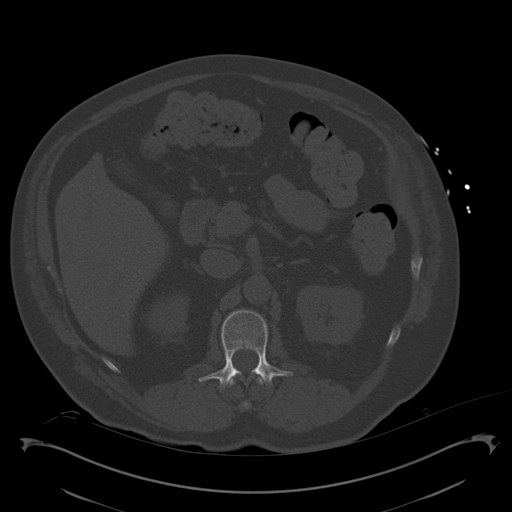
[im 82/111  soft-tissue]
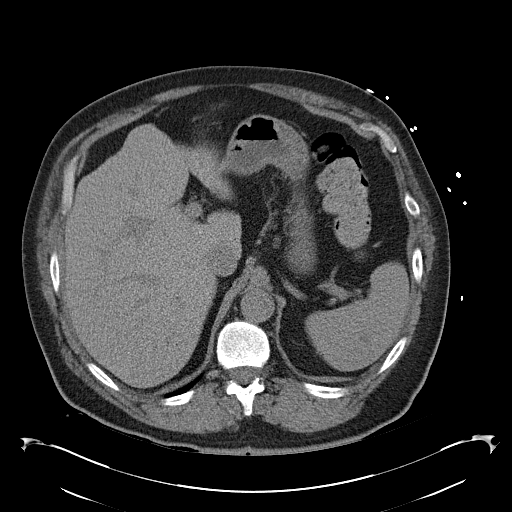
[im 87/111  soft-tissue]
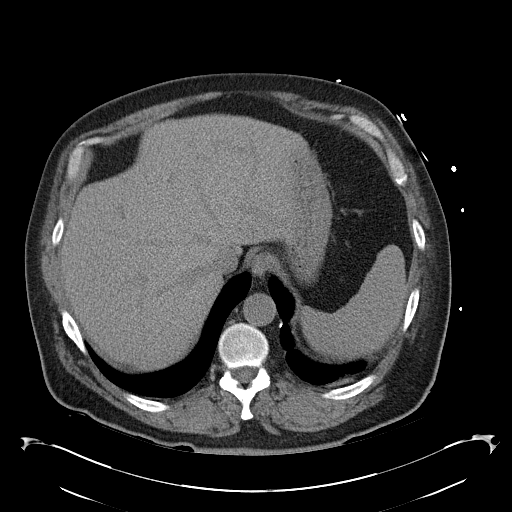
[im 96/111  soft-tissue]
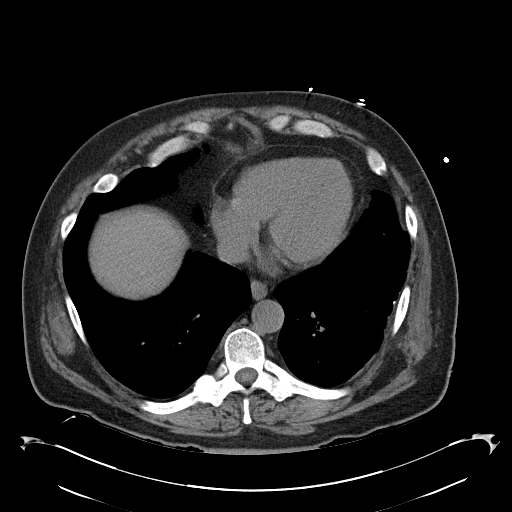
[im 106/111  soft-tissue]
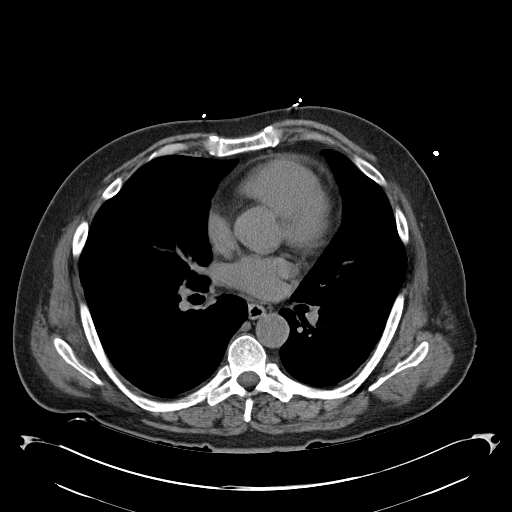

[Series 3: mpr coronal · coronal · 0.88mm/px · 3 of 119 slices shown]
[im 40/119  soft-tissue]
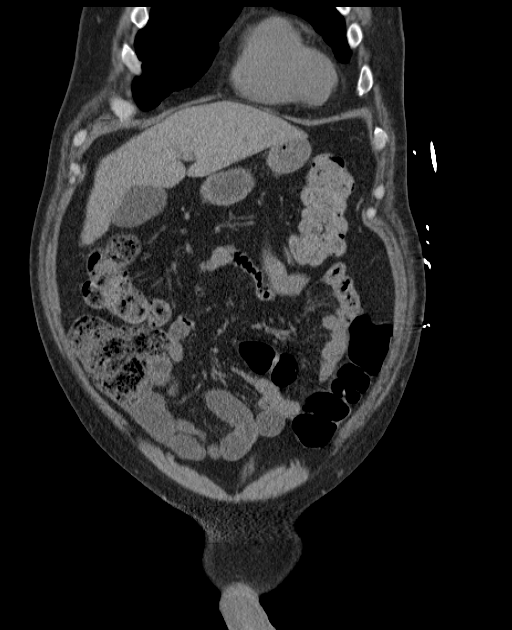
[im 53/119  soft-tissue]
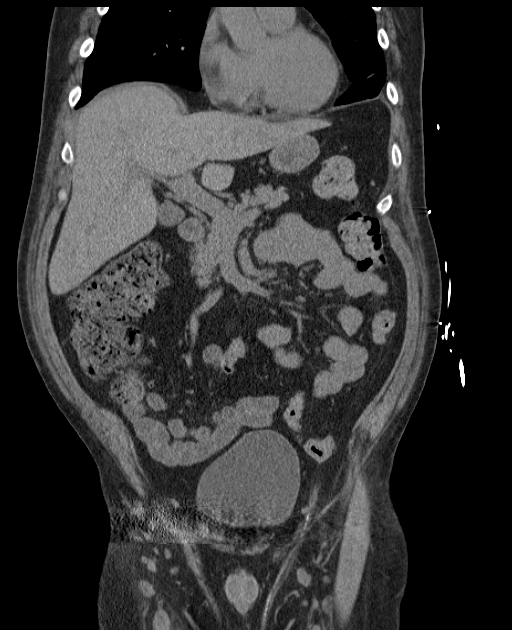
[im 66/119  soft-tissue]
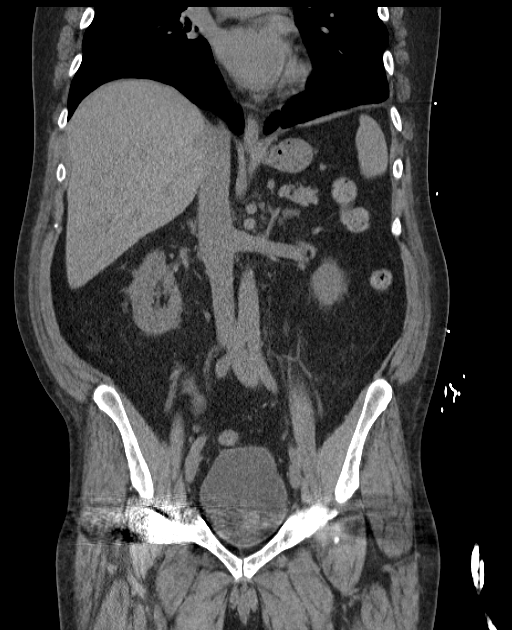

[16 of 46 positions shown; findings below may reference images not displayed]

FINDINGS: Lower chest:  No acute findings.

Hepatobiliary: No mass visualized on this un-enhanced exam.
Gallbladder is unremarkable.

Pancreas: No mass or inflammatory process identified on this
un-enhanced exam.

Spleen: Within normal limits in size.

Adrenals/Urinary Tract: No evidence of urolithiasis or
hydronephrosis. No definite mass visualized on this un-enhanced
exam.

Stomach/Bowel: No evidence of obstruction, inflammatory process, or
abnormal fluid collections. Normal appendix visualized.

Vascular/Lymphatic: No pathologically enlarged lymph nodes. No
evidence of abdominal aortic aneurysm.

Reproductive: No mass or other significant abnormality.

Other: Right hip prosthesis results in streak artifact through the
inferior pelvis.

Musculoskeletal:  No suspicious bone lesions identified.
IMPRESSION: No evidence of urolithiasis, hydronephrosis, or other acute
findings.

## 2017-10-30 IMAGING — CT CT HEAD W/O CM
1 of 2 series · 16 of 30 positions shown, 20 images · non-contrast
Comparison: 02/07/2011

CLINICAL DATA: Syncopal episode

EXAM:
CT HEAD WITHOUT CONTRAST
TECHNIQUE: Contiguous axial images were obtained from the base of the skull
through the vertex without intravenous contrast.

[Series 2: headseq 4.8 h37s · axial · 0.48mm/px · z∈[+68,+223]mm · 16 of 36 slices shown, 20 images]
[im 2/36  brain]
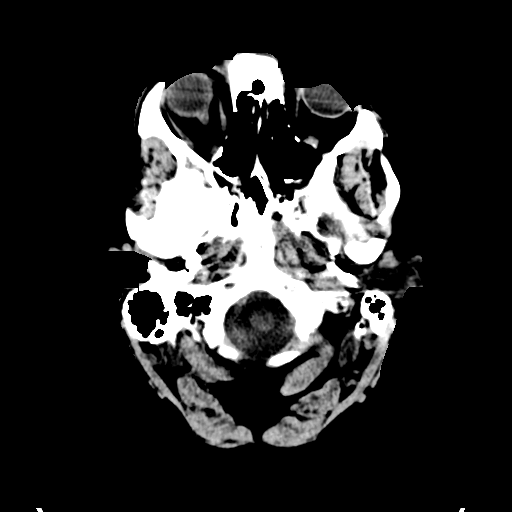
[im 2/36  bone]
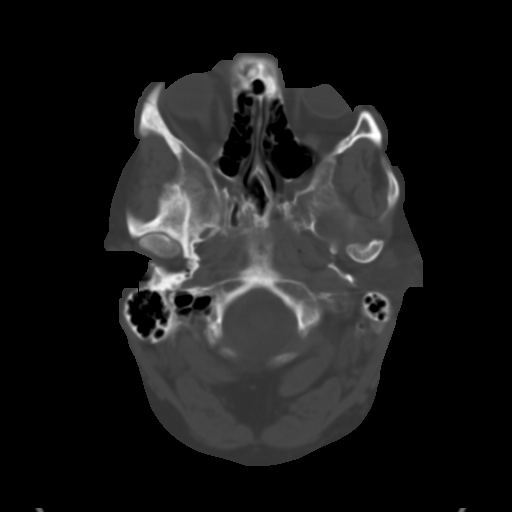
[im 4/36  brain]
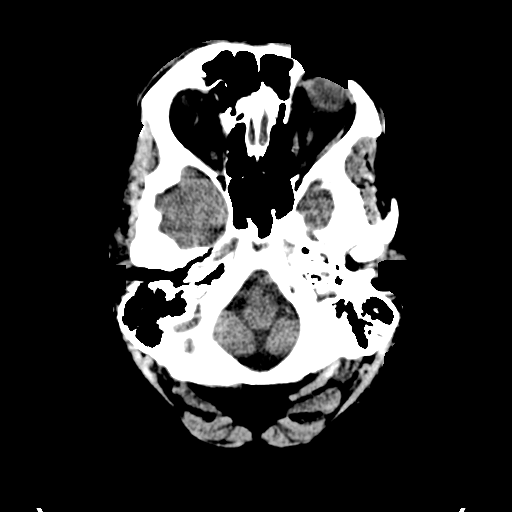
[im 6/36  brain]
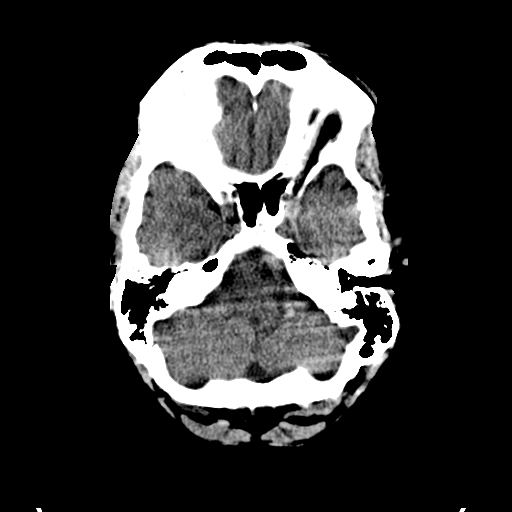
[im 8/36  brain]
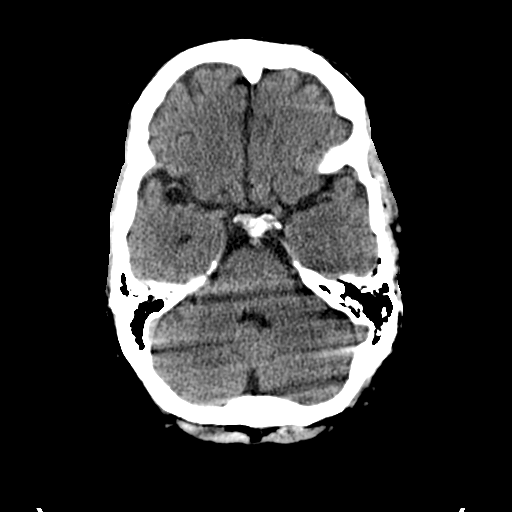
[im 12/36  brain]
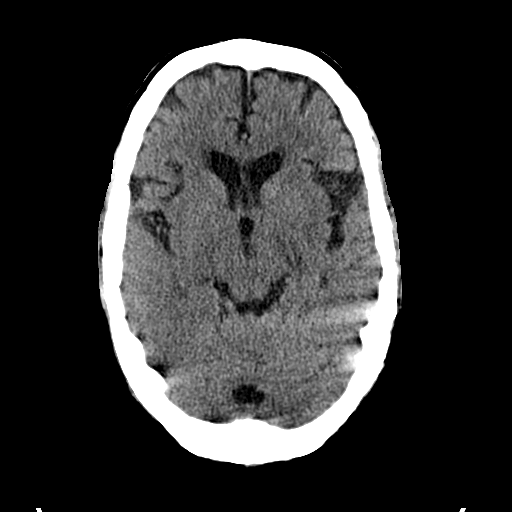
[im 12/36  bone]
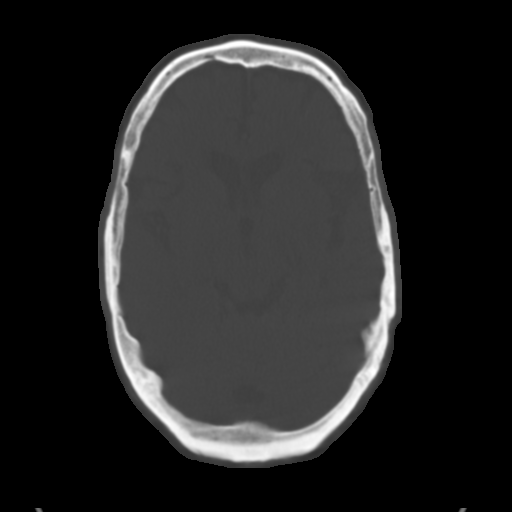
[im 13/36  brain]
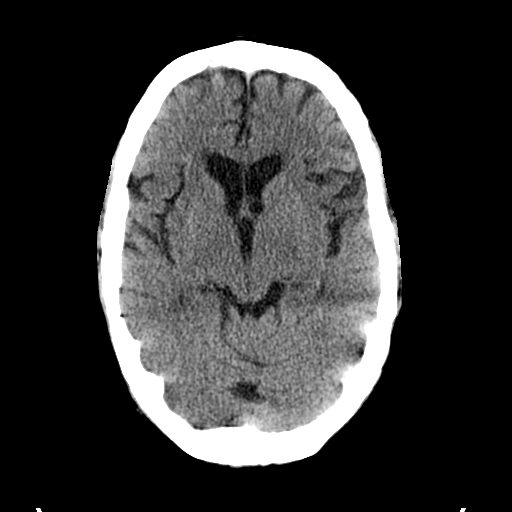
[im 15/36  brain]
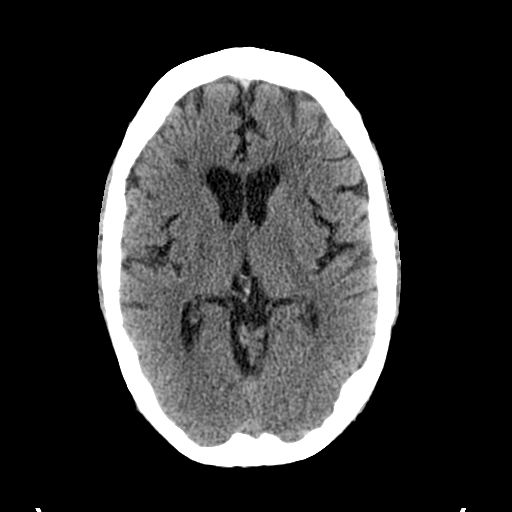
[im 17/36  brain]
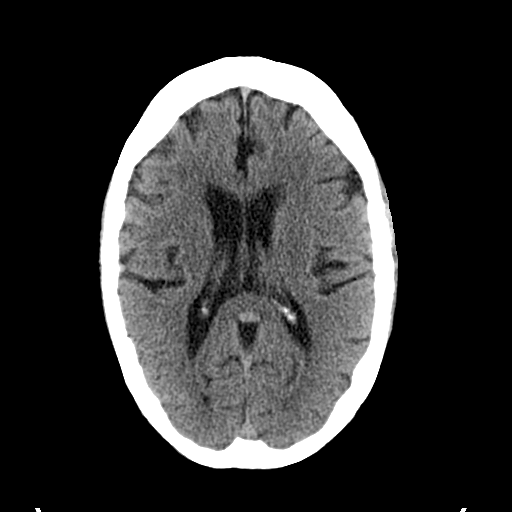
[im 19/36  brain]
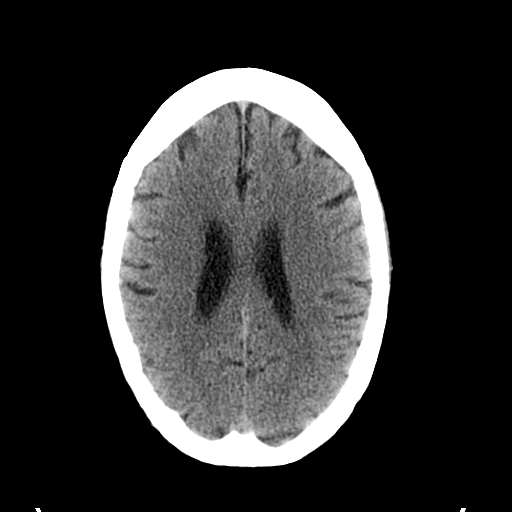
[im 19/36  bone]
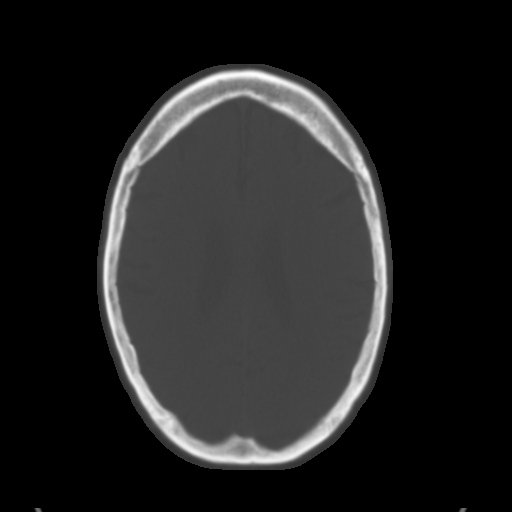
[im 21/36  brain]
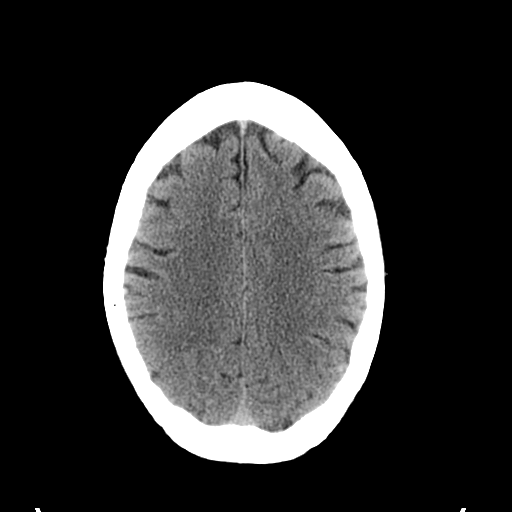
[im 23/36  brain]
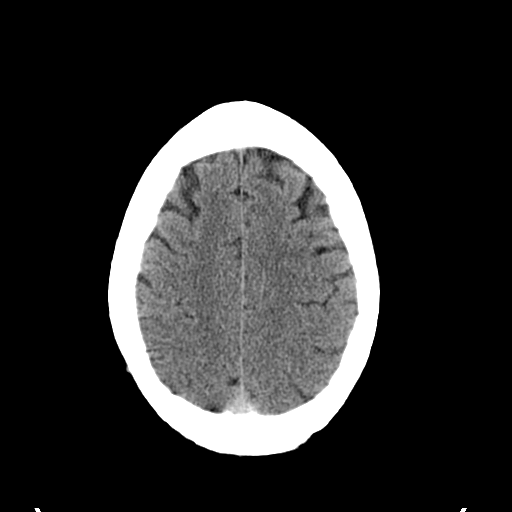
[im 24/36  brain]
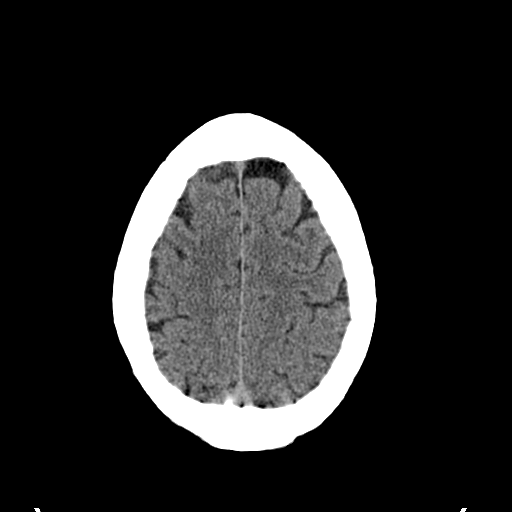
[im 28/36  brain]
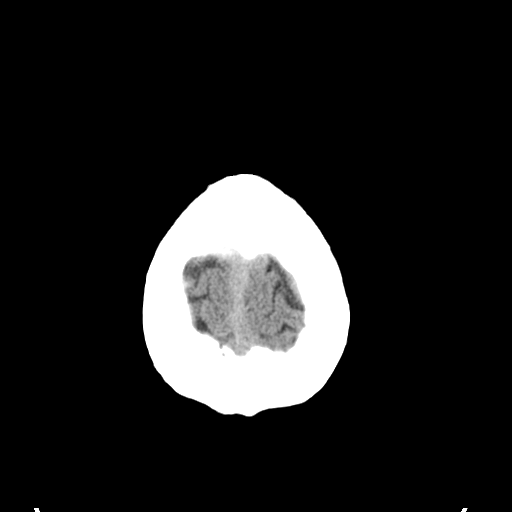
[im 28/36  bone]
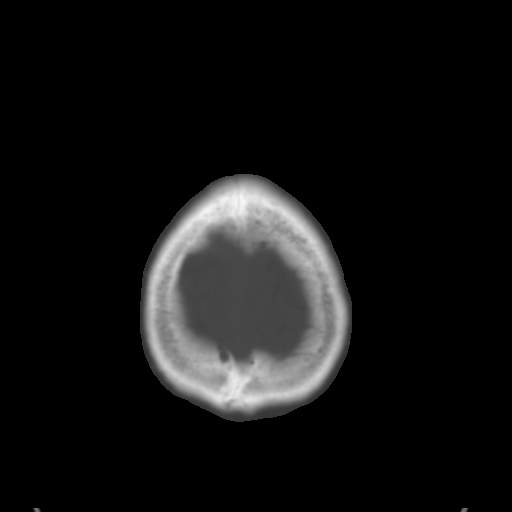
[im 30/36  brain]
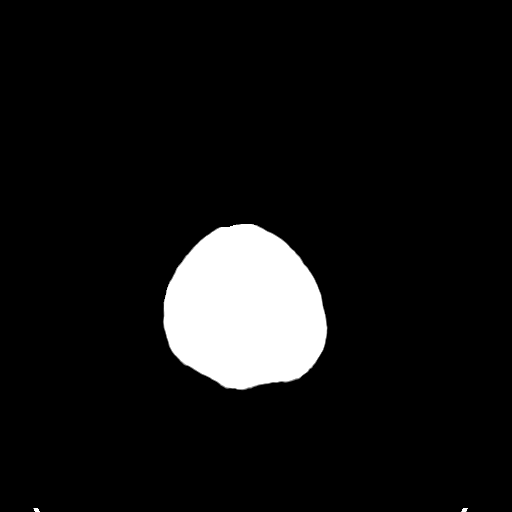
[im 32/36  brain]
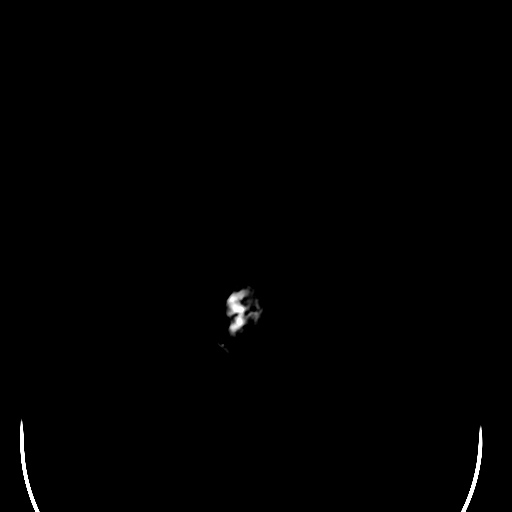
[im 34/36  brain]
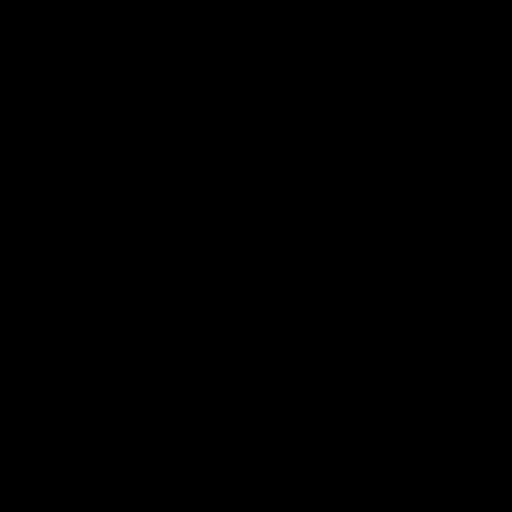

[16 of 30 positions shown; findings below may reference images not displayed]

FINDINGS: Atrophic changes are noted. The bony calvarium is intact. No
findings to suggest acute hemorrhage, acute infarction or
space-occupying mass lesion are seen.
IMPRESSION: Atrophic changes without acute abnormality.

## 2017-10-30 IMAGING — DX DG CHEST 2V
2 series · 2 of 2 positions shown · non-contrast
Comparison: 12/06/2013

CLINICAL DATA: Right-sided chest pain

EXAM:
CHEST  2 VIEW

[chest lat]
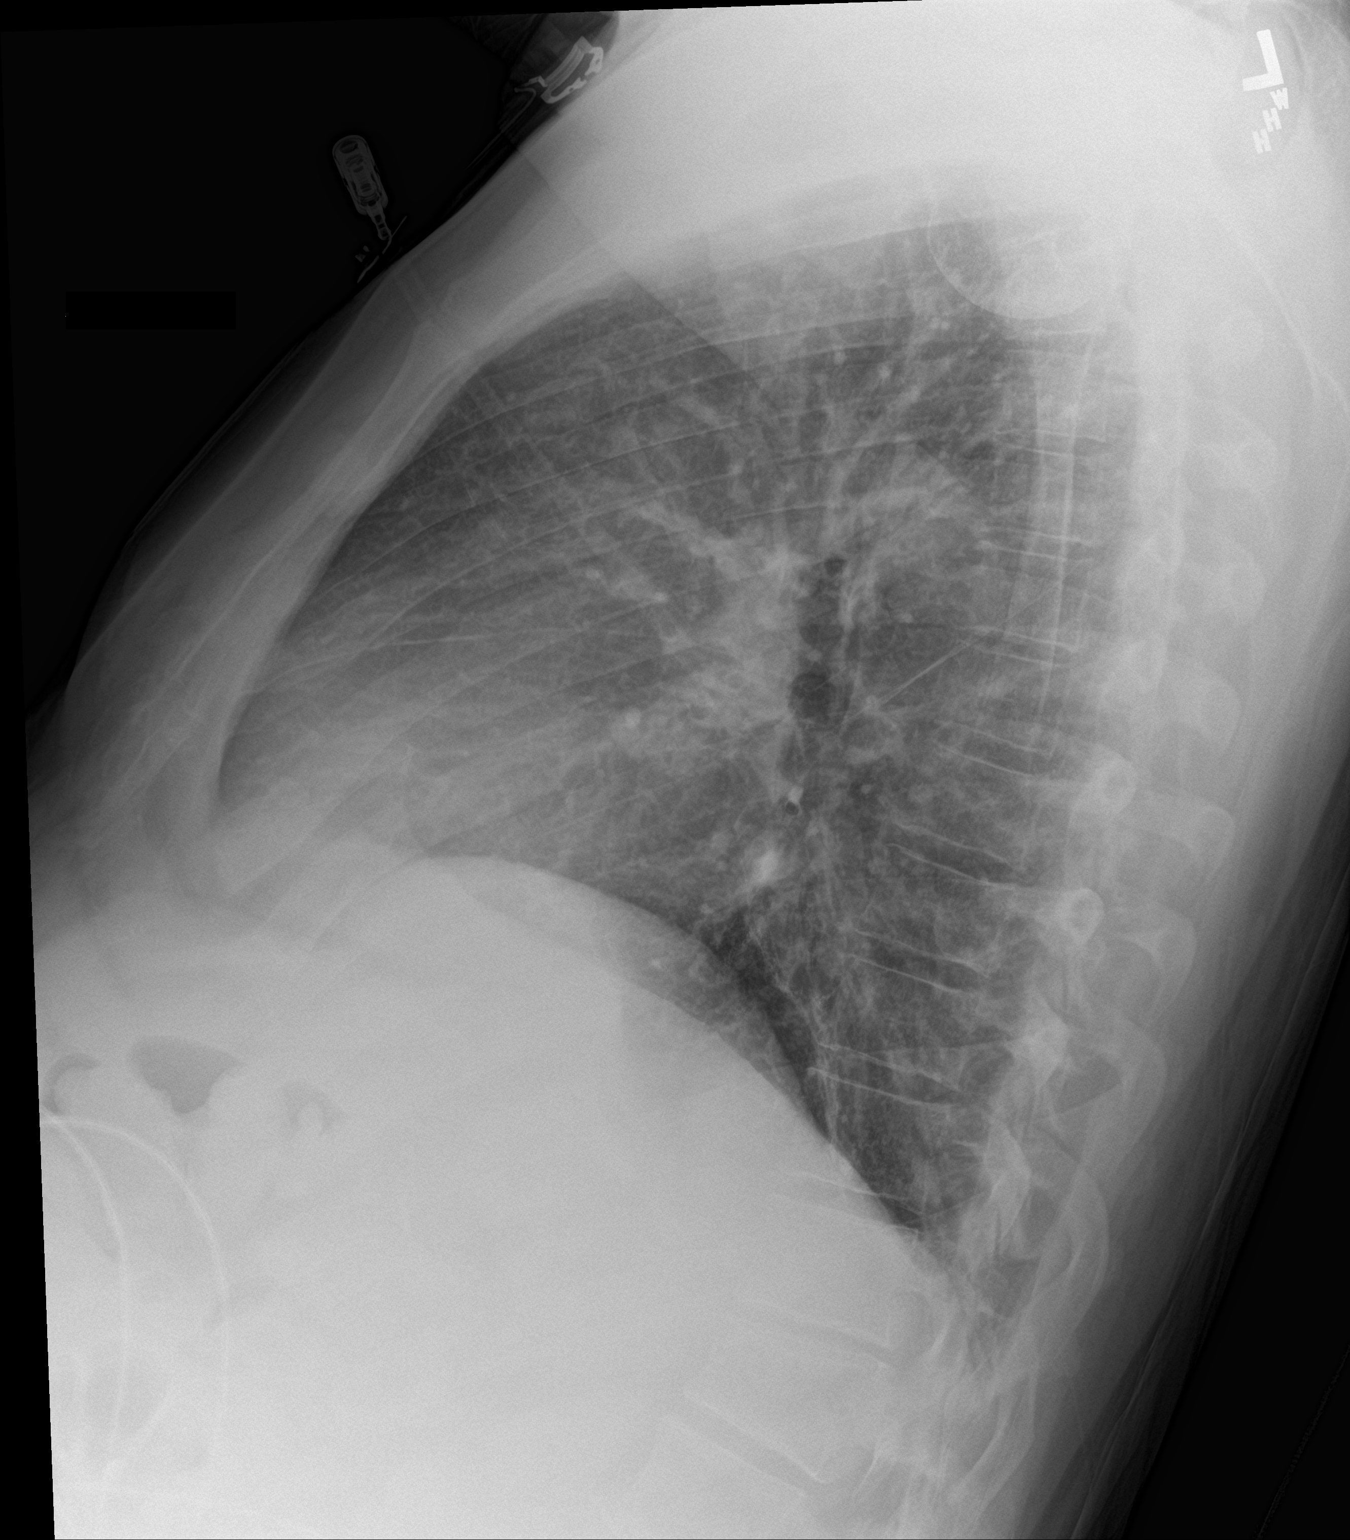

[chest ap strecther]
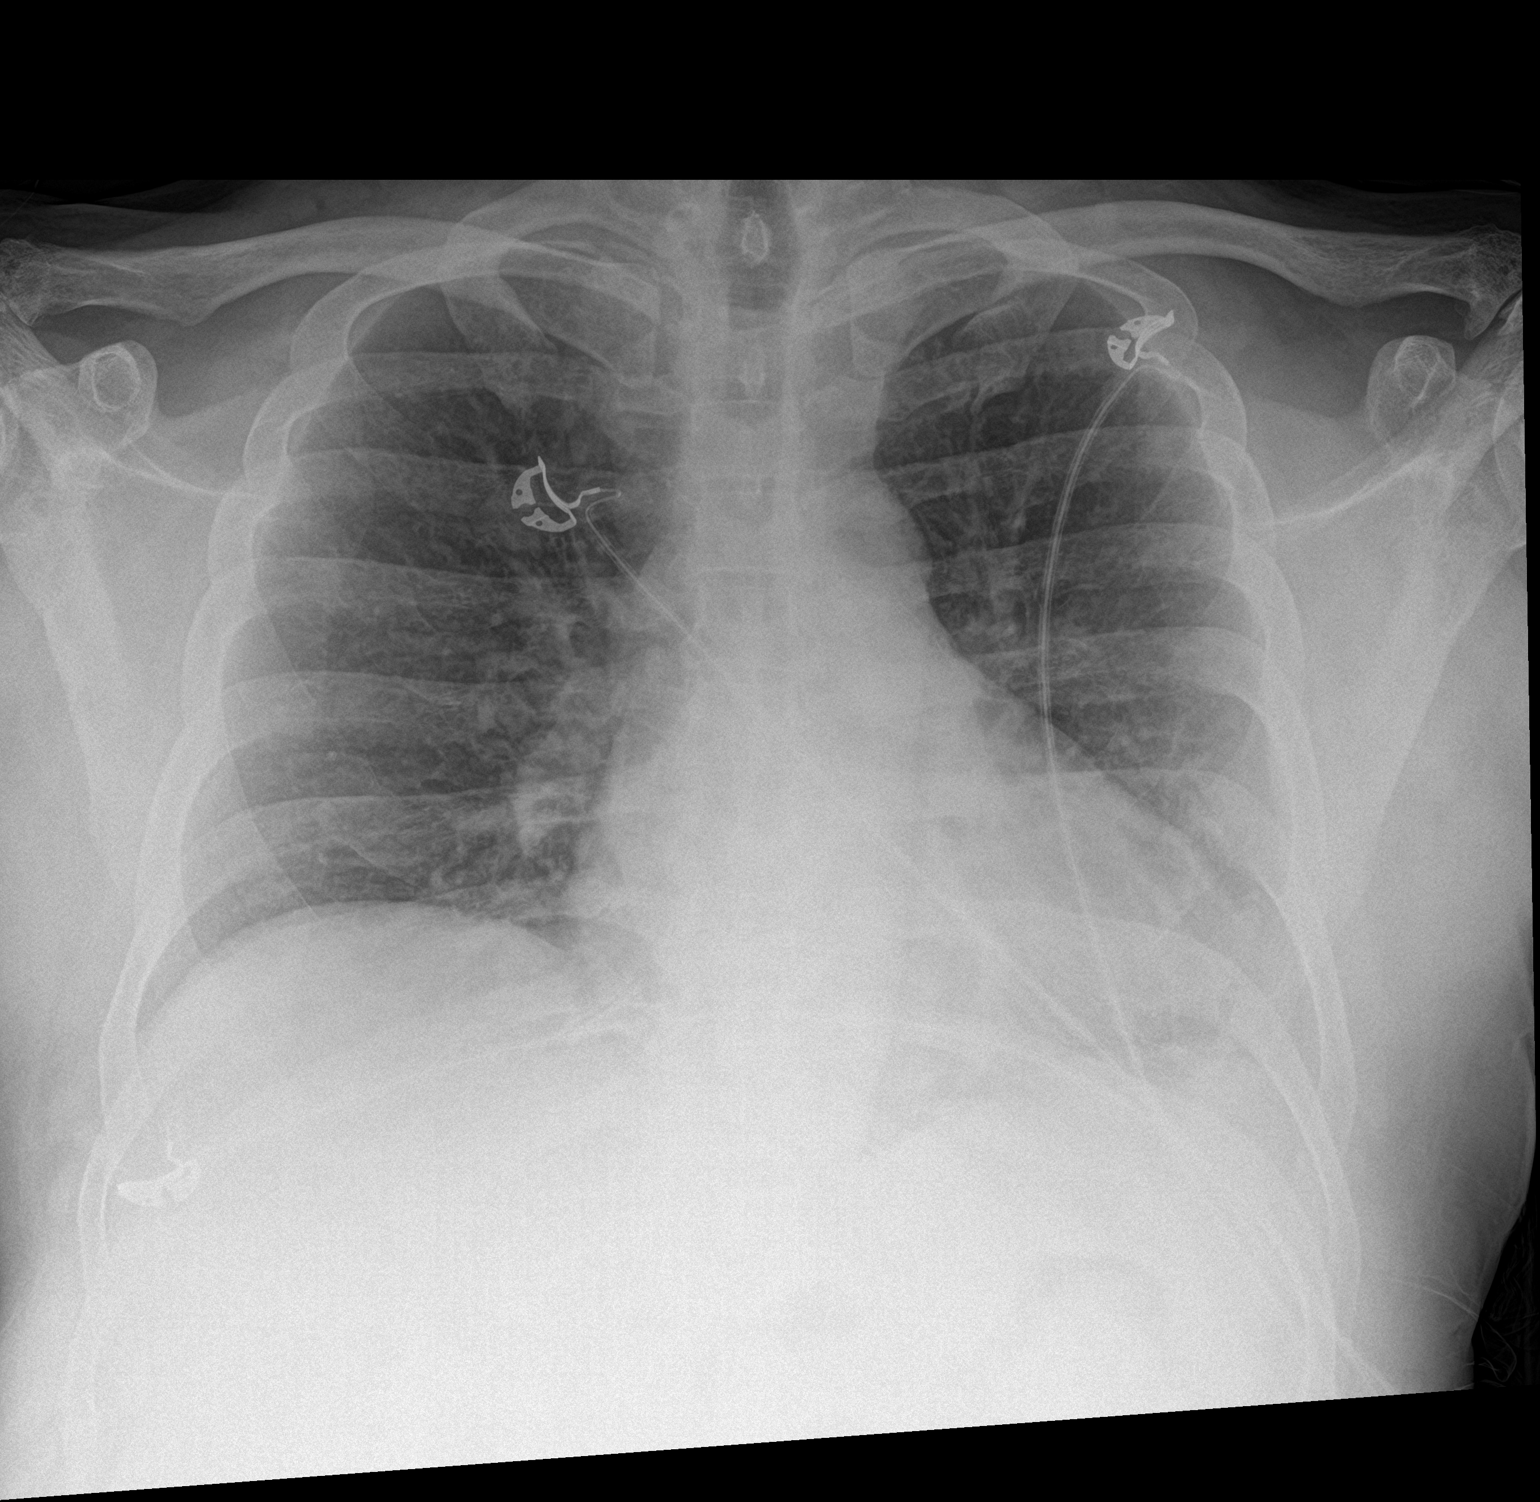

[2 of 2 positions shown; findings below may reference images not displayed]

FINDINGS: Cardiac shadow is within normal limits. The lungs are well aerated
bilaterally. No focal infiltrate or sizable effusion is seen. No
acute bony abnormality is noted.
IMPRESSION: No active cardiopulmonary disease.

## 2017-11-15 ENCOUNTER — Telehealth (INDEPENDENT_AMBULATORY_CARE_PROVIDER_SITE_OTHER): Payer: Self-pay | Admitting: Physician Assistant

## 2017-11-15 NOTE — Telephone Encounter (Signed)
Last 2 ov notes faxed to [facility] [phone]

## 2017-11-29 ENCOUNTER — Ambulatory Visit (INDEPENDENT_AMBULATORY_CARE_PROVIDER_SITE_OTHER): Payer: Medicare Other | Admitting: Physician Assistant

## 2017-11-29 ENCOUNTER — Encounter (INDEPENDENT_AMBULATORY_CARE_PROVIDER_SITE_OTHER): Payer: Self-pay | Admitting: Orthopedic Surgery

## 2017-11-29 VITALS — Ht 75.0 in | Wt 180.0 lb

## 2017-11-29 DIAGNOSIS — L97529 Non-pressure chronic ulcer of other part of left foot with unspecified severity: Secondary | ICD-10-CM

## 2017-11-29 DIAGNOSIS — L97521 Non-pressure chronic ulcer of other part of left foot limited to breakdown of skin: Secondary | ICD-10-CM | POA: Diagnosis not present

## 2017-11-29 DIAGNOSIS — E11621 Type 2 diabetes mellitus with foot ulcer: Secondary | ICD-10-CM | POA: Diagnosis not present

## 2017-11-30 ENCOUNTER — Encounter (INDEPENDENT_AMBULATORY_CARE_PROVIDER_SITE_OTHER): Payer: Self-pay | Admitting: Physician Assistant

## 2017-11-30 NOTE — Progress Notes (Signed)
Office Visit Note   Patient: [name]           Date of Birth: 04-27-1965           MRN: [id number] Visit Date: 11/29/2017              Requested by: Lucia Gaskins, MD [address] [place], [place] [address] PCP: Lucia Gaskins, MD  Chief Complaint  Patient presents with  . Left Foot - Wound Check, Follow-up      HPI: The patient is a 52 yo male who is seen for follow up of his left foot ulcer and right 4th toe amputation 07/23/2017. He has healed well following the amputation. He reports he is getting bilateral orthotics over the next couple of days.   Assessment & Plan: Visit Diagnoses:  1. Ulcer of left foot, limited to breakdown of skin ([place])   2. Type 2 diabetes mellitus with left diabetic foot ulcer ([place])     Plan: After informed consent the left foot ulcer was debrided with a #10 blade knife for skin , soft tissue and callus and the patient tolerated this well. He is obtaining orthotics to help off load the area. Counseled to minimize weight bearing as able. He will follow up in 4 weeks.   Follow-Up Instructions: Return in about 4 weeks (around 12/27/2017).   Ortho Exam  Patient is alert, oriented, no adenopathy, well-dressed, normal affect, normal respiratory effort. The left foot ulcer was debrided with a #10 blade knife after informed consent and the patient tolerated the procedure well. The ulcer is 1.5 cm diameter with a 2 mm central deeper ulcer to the fat layer. No bone or tendon exposed. No signs of cellulitis or infection.  The right foot 4th toe amputation site is healing well and without signs of infection.  Imaging: No results found. No images are attached to the encounter.  Labs: Lab Results  Component Value Date   HGBA1C 6.7 (H) 06/07/2017   HGBA1C 10.3 (H) 08/17/2016   HGBA1C 7.7 (H) 12/18/2013   ESRSEDRATE 5 06/04/2017   ESRSEDRATE 110 (H) 12/19/2013   ESRSEDRATE 104 (H) 12/18/2013   CRP <0.8 06/05/2017   CRP 12.2 (H) 12/19/2013   CRP 14.9 (H) 12/18/2013   REPTSTATUS 12/24/2013 FINAL 12/18/2013   REPTSTATUS 12/24/2013 FINAL 12/18/2013   GRAMSTAIN  04/19/2011    WBC PRESENT,BOTH PMN AND MONONUCLEAR NO ORGANISMS SEEN   CULT  12/18/2013    NO GROWTH 5 DAYS Performed at [facility]  12/18/2013    NO GROWTH 5 DAYS Performed at [facility]      Lab Results  Component Value Date   ALBUMIN 3.8 06/05/2017   ALBUMIN 4.3 12/14/2016   ALBUMIN 3.7 09/19/2016    Body mass index is 22.5 kg/m.  Orders:  No orders of the defined types were placed in this encounter.  No orders of the defined types were placed in this encounter.    Procedures: No procedures performed  Clinical Data: No additional findings.  ROS:  All other systems negative, except as noted in the HPI. Review of Systems  Objective: Vital Signs: Ht 6\' 3"  (1.905 m)   Wt 180 lb (81.6 kg)   BMI 22.50 kg/m   Specialty Comments:  No specialty comments available.  PMFS History: Patient Active Problem List   Diagnosis Date Noted  . H/O amputation of lesser toe, right ([place]) 07/29/2017  . Diabetic foot infection ([place]) 06/22/2017  . Post  op infection 06/22/2017  . SVT (supraventricular tachycardia) ([place]) 08/17/2016  . Tobacco use disorder 10/25/2014  . Diabetes mellitus type 2 in nonobese (HCC)   . Swelling   . Type 2 diabetes mellitus with left diabetic foot ulcer ([place]) 12/18/2013  . Diabetes mellitus due to underlying condition with foot ulcer ([place]) 12/18/2013  . Left leg cellulitis 12/18/2013  . Cocaine abuse ([place]) 03/29/2012  . Generalized anxiety disorder 03/29/2012  . Panic attacks 03/29/2012  . Alcohol dependence ([place]) 08/05/2011    Class: Chronic  . Substance abuse ([place]) 05/31/2011  . Ulcer of left foot, limited to breakdown of skin ([place]) 02/15/2011  . Neuropathic ulcer of foot ([place]) 02/15/2011   Past Medical History:  Diagnosis Date  . Anxiety   . Arthritis    "everywhere" (04/21/2012)  .  Cellulitis and abscess of foot 12/19/2013   LEFT FOOT  . Chronic pain   . DDD (degenerative disc disease)   . Depression   . Diabetes mellitus without complication (HCC)    borderline  . Diabetic foot ulcer ([place])   . ETOH abuse   . Full dentures   . GERD (gastroesophageal reflux disease)    tums  . History of blood transfusion    "related to left knee OR; probably right hip too" (04/21/2012)  . Mental disorder   . Neuromuscular disorder (HCC)    neuropathy  . Noncompliance   . Open wound    bottom of foot  . Osteomyelitis ([place])    right 4th toe  . Pneumonia ~ 2012  . Polysubstance abuse (HCC)    etoh, cocaine, heroin  . Stroke [facility]) 2008   "they said I might have had one during right hip replacement" (04/21/2012)  . Wears glasses     Family History  Problem Relation Age of Onset  . Diabetes Father     Past Surgical History:  Procedure Laterality Date  . AMPUTATION Right 06/09/2017   Procedure: RIGHT 2ND TOE AMPUTATION;  Surgeon: [name], MD;  Location: [facility];  Service: Orthopedics;  Laterality: Right;  . AMPUTATION Right 07/23/2017   Procedure: RIGHT 4TH TOE AMPUTATION;  Surgeon: [name], MD;  Location: [place];  Service: Orthopedics;  Laterality: Right;  . JOINT REPLACEMENT    . KNEE ARTHROSCOPY Bilateral 1980's/1990's  . LUNG LOBECTOMY Left ~ 2006  . LUNG LOBECTOMY    . METATARSAL OSTEOTOMY  10/29/2011   Procedure: METATARSAL OSTEOTOMY;  Surgeon: [name], MD;  Location: [place];  Service: Orthopedics;  Laterality: Left;  Left 1st Metatarsal Dorsal Closing Wedge   . METATARSAL OSTEOTOMY Right 04/28/2017   Procedure: RIGHT 1ST METATARSAL DORSAL CLOSING WEDGE OSTEOTOMY;  Surgeon: [name], MD;  Location: [place];  Service: Orthopedics;  Laterality: Right;  . MULTIPLE TOOTH EXTRACTIONS    . REVISION TOTAL HIP ARTHROPLASTY Right 2008   "4-5 months after replacement" (04/21/2012)  . TOTAL HIP ARTHROPLASTY Right 2008  . TOTAL KNEE ARTHROPLASTY Left 2006  .  TOTAL KNEE ARTHROPLASTY Right 04/20/2012  . TOTAL KNEE ARTHROPLASTY Right 04/20/2012   Procedure: TOTAL KNEE ARTHROPLASTY;  Surgeon: [name], MD;  Location: [place];  Service: Orthopedics;  Laterality: Right;  Right Total Knee Arthroplasty   Social History   Occupational History  . Not on file  Tobacco Use  . Smoking status: Current Every Day Smoker    Packs/day: 1.00    Years: 30.00    Pack years: 30.00    Types: Cigarettes  .  Smokeless tobacco: Never Used  Substance and Sexual Activity  . Alcohol use: No    Alcohol/week: 0.0 standard drinks    Frequency: Never  . Drug use: Not Currently    Types: Cocaine    Comment: Cocaine and heroin   . Sexual activity: Yes    Birth control/protection: None

## 2017-12-27 ENCOUNTER — Telehealth (INDEPENDENT_AMBULATORY_CARE_PROVIDER_SITE_OTHER): Payer: Self-pay

## 2017-12-27 NOTE — Telephone Encounter (Signed)
Patient called concerning paperwork that needs to be signed off by Dr. Sharol Given.  Advised patient that we have not received any paperwork from [facility] pertaining to custom Orthotics.  Advise patient to have [facility] to fax paperwork to [phone].  Patient understands.

## 2017-12-28 ENCOUNTER — Telehealth (INDEPENDENT_AMBULATORY_CARE_PROVIDER_SITE_OTHER): Payer: Self-pay | Admitting: Orthopedic Surgery

## 2017-12-28 NOTE — Telephone Encounter (Signed)
Patient called advised [facility] faxed over an order for orthotics for him. Patient said the order was faxed over today needing Dr. Jess Barters signature. Patient said it was previously signed by Raquel Sarna and Dr Sharol Given need to sign it. The number to contact patient is [phone]

## 2017-12-28 NOTE — Telephone Encounter (Signed)
Done, thanks

## 2017-12-30 ENCOUNTER — Ambulatory Visit (INDEPENDENT_AMBULATORY_CARE_PROVIDER_SITE_OTHER): Payer: Medicare Other | Admitting: Physician Assistant

## 2017-12-30 ENCOUNTER — Encounter (INDEPENDENT_AMBULATORY_CARE_PROVIDER_SITE_OTHER): Payer: Self-pay | Admitting: Orthopedic Surgery

## 2017-12-30 VITALS — Ht 75.0 in | Wt 180.0 lb

## 2017-12-30 DIAGNOSIS — Z89421 Acquired absence of other right toe(s): Secondary | ICD-10-CM | POA: Diagnosis not present

## 2017-12-30 DIAGNOSIS — L97521 Non-pressure chronic ulcer of other part of left foot limited to breakdown of skin: Secondary | ICD-10-CM | POA: Diagnosis not present

## 2017-12-30 DIAGNOSIS — L97529 Non-pressure chronic ulcer of other part of left foot with unspecified severity: Secondary | ICD-10-CM | POA: Diagnosis not present

## 2017-12-30 DIAGNOSIS — E11621 Type 2 diabetes mellitus with foot ulcer: Secondary | ICD-10-CM | POA: Diagnosis not present

## 2017-12-31 ENCOUNTER — Encounter (INDEPENDENT_AMBULATORY_CARE_PROVIDER_SITE_OTHER): Payer: Self-pay | Admitting: Physician Assistant

## 2017-12-31 NOTE — Progress Notes (Signed)
Office Visit Note   Patient: [name]           Date of Birth: 1965/06/06           MRN: [id number] Visit Date: 12/30/2017              Requested by: Lucia Gaskins, MD [address] [place], [place] [address] PCP: Lucia Gaskins, MD  Chief Complaint  Patient presents with  . Left Foot - Follow-up    Rx for Orthotics shoes needed      HPI: The patient is a 52 yo gentleman who is seen for post operative follow up following right 4th toe amputation 07/23/17. He has residual ulcer/callus over the left 1st metatarsal head and returns for debridement of this today. He reports he has not been able to obtain his orthotics yet.   Assessment & Plan: Visit Diagnoses:  1. Ulcer of left foot, limited to breakdown of skin ([place])   2. Type 2 diabetes mellitus with left diabetic foot ulcer ([place])   3. H/O amputation of lesser toe, right (HCC)     Plan: After informed consent the left foot 1st metatarsal head was debrided to healthy tissue and the patient tolerated this well.   Follow-Up Instructions: Return in about 4 weeks (around 01/27/2018).   Ortho Exam  Patient is alert, oriented, no adenopathy, well-dressed, normal affect, normal respiratory effort. The left foot 1st metatarsal head ulcer was debrided with a #10 blade knife to healthy appearing tissue after informed consent and the patient tolerated this well. His previous amputation site is well healed. There are no new ulcers. No signs of infection or cellulitis. Palpable pedal pulses. No edema.   Imaging: No results found. No images are attached to the encounter.  Labs: Lab Results  Component Value Date   HGBA1C 6.7 (H) 06/07/2017   HGBA1C 10.3 (H) 08/17/2016   HGBA1C 7.7 (H) 12/18/2013   ESRSEDRATE 5 06/04/2017   ESRSEDRATE 110 (H) 12/19/2013   ESRSEDRATE 104 (H) 12/18/2013   CRP <0.8 06/05/2017   CRP 12.2 (H) 12/19/2013   CRP 14.9 (H) 12/18/2013   REPTSTATUS 12/24/2013 FINAL 12/18/2013   REPTSTATUS 12/24/2013  FINAL 12/18/2013   GRAMSTAIN  04/19/2011    WBC PRESENT,BOTH PMN AND MONONUCLEAR NO ORGANISMS SEEN   CULT  12/18/2013    NO GROWTH 5 DAYS Performed at [facility]  12/18/2013    NO GROWTH 5 DAYS Performed at [facility]      Lab Results  Component Value Date   ALBUMIN 3.8 06/05/2017   ALBUMIN 4.3 12/14/2016   ALBUMIN 3.7 09/19/2016    Body mass index is 22.5 kg/m.  Orders:  No orders of the defined types were placed in this encounter.  No orders of the defined types were placed in this encounter.    Procedures: No procedures performed  Clinical Data: No additional findings.  ROS:  All other systems negative, except as noted in the HPI. Review of Systems  Objective: Vital Signs: Ht 6\' 3"  (1.905 m)   Wt 180 lb (81.6 kg)   BMI 22.50 kg/m   Specialty Comments:  No specialty comments available.  PMFS History: Patient Active Problem List   Diagnosis Date Noted  . H/O amputation of lesser toe, right ([place]) 07/29/2017  . Diabetic foot infection ([place]) 06/22/2017  . Post op infection 06/22/2017  . SVT (supraventricular tachycardia) ([place]) 08/17/2016  . Tobacco use disorder 10/25/2014  . Diabetes mellitus type 2  in nonobese ([place])   . Swelling   . Type 2 diabetes mellitus with left diabetic foot ulcer ([place]) 12/18/2013  . Diabetes mellitus due to underlying condition with foot ulcer ([place]) 12/18/2013  . Left leg cellulitis 12/18/2013  . Cocaine abuse ([place]) 03/29/2012  . Generalized anxiety disorder 03/29/2012  . Panic attacks 03/29/2012  . Alcohol dependence ([place]) 08/05/2011    Class: Chronic  . Substance abuse ([place]) 05/31/2011  . Ulcer of left foot, limited to breakdown of skin ([place]) 02/15/2011  . Neuropathic ulcer of foot ([place]) 02/15/2011   Past Medical History:  Diagnosis Date  . Anxiety   . Arthritis    "everywhere" (04/21/2012)  . Cellulitis and abscess of foot 12/19/2013   LEFT FOOT  . Chronic pain   . DDD (degenerative  disc disease)   . Depression   . Diabetes mellitus without complication (HCC)    borderline  . Diabetic foot ulcer ([place])   . ETOH abuse   . Full dentures   . GERD (gastroesophageal reflux disease)    tums  . History of blood transfusion    "related to left knee OR; probably right hip too" (04/21/2012)  . Mental disorder   . Neuromuscular disorder (HCC)    neuropathy  . Noncompliance   . Open wound    bottom of foot  . Osteomyelitis ([employer])    right 4th toe  . Pneumonia ~ 2012  . Polysubstance abuse (HCC)    etoh, cocaine, heroin  . Stroke [facility]) 2008   "they said I might have had one during right hip replacement" (04/21/2012)  . Wears glasses     Family History  Problem Relation Age of Onset  . Diabetes Father     Past Surgical History:  Procedure Laterality Date  . AMPUTATION Right 06/09/2017   Procedure: RIGHT 2ND TOE AMPUTATION;  Surgeon: [name], MD;  Location: [facility];  Service: Orthopedics;  Laterality: Right;  . AMPUTATION Right 07/23/2017   Procedure: RIGHT 4TH TOE AMPUTATION;  Surgeon: [name], MD;  Location: [facility];  Service: Orthopedics;  Laterality: Right;  . JOINT REPLACEMENT    . KNEE ARTHROSCOPY Bilateral 1980's/1990's  . LUNG LOBECTOMY Left ~ 2006  . LUNG LOBECTOMY    . METATARSAL OSTEOTOMY  10/29/2011   Procedure: METATARSAL OSTEOTOMY;  Surgeon: [name], MD;  Location: [facility];  Service: Orthopedics;  Laterality: Left;  Left 1st Metatarsal Dorsal Closing Wedge   . METATARSAL OSTEOTOMY Right 04/28/2017   Procedure: RIGHT 1ST METATARSAL DORSAL CLOSING WEDGE OSTEOTOMY;  Surgeon: [name], MD;  Location: [facility];  Service: Orthopedics;  Laterality: Right;  . MULTIPLE TOOTH EXTRACTIONS    . REVISION TOTAL HIP ARTHROPLASTY Right 2008   "4-5 months after replacement" (04/21/2012)  . TOTAL HIP ARTHROPLASTY Right 2008  . TOTAL KNEE ARTHROPLASTY Left 2006  . TOTAL KNEE ARTHROPLASTY Right 04/20/2012  . TOTAL KNEE ARTHROPLASTY Right 04/20/2012   Procedure:  TOTAL KNEE ARTHROPLASTY;  Surgeon: [name], MD;  Location: [facility];  Service: Orthopedics;  Laterality: Right;  Right Total Knee Arthroplasty   Social History   Occupational History  . Not on file  Tobacco Use  . Smoking status: Current Every Day Smoker    Packs/day: 1.00    Years: 30.00    Pack years: 30.00    Types: Cigarettes  . Smokeless tobacco: Never Used  Substance and Sexual Activity  . Alcohol use: No    Alcohol/week: 0.0 standard drinks  Frequency: Never  . Drug use: Not Currently    Types: Cocaine    Comment: Cocaine and heroin   . Sexual activity: Yes    Birth control/protection: None

## 2018-01-31 ENCOUNTER — Ambulatory Visit (INDEPENDENT_AMBULATORY_CARE_PROVIDER_SITE_OTHER): Payer: Medicare Other | Admitting: Orthopedic Surgery

## 2018-01-31 ENCOUNTER — Encounter (INDEPENDENT_AMBULATORY_CARE_PROVIDER_SITE_OTHER): Payer: Self-pay | Admitting: Orthopedic Surgery

## 2018-01-31 VITALS — Ht 75.0 in | Wt 180.0 lb

## 2018-01-31 DIAGNOSIS — L97529 Non-pressure chronic ulcer of other part of left foot with unspecified severity: Secondary | ICD-10-CM | POA: Diagnosis not present

## 2018-01-31 DIAGNOSIS — L97521 Non-pressure chronic ulcer of other part of left foot limited to breakdown of skin: Secondary | ICD-10-CM

## 2018-01-31 DIAGNOSIS — E11621 Type 2 diabetes mellitus with foot ulcer: Secondary | ICD-10-CM

## 2018-01-31 DIAGNOSIS — Z89421 Acquired absence of other right toe(s): Secondary | ICD-10-CM | POA: Diagnosis not present

## 2018-01-31 NOTE — Progress Notes (Signed)
Office Visit Note   Patient: [name]           Date of Birth: Sep 11, 1965           MRN: [id number] Visit Date: 01/31/2018              Requested by: Lucia Gaskins, MD [address] [place], [place] [address] PCP: Lucia Gaskins, MD  Chief Complaint  Patient presents with  . Left Foot - Follow-up      HPI: Patient is a 53 year old gentleman who presents in follow-up for Wagoner grade 1 ulcer first metatarsal head of the left foot.  Patient states he also developed a blister of the little toe of the right foot secondary to his new shoe wear he states he finally has his new orthotics.  Assessment & Plan: Visit Diagnoses:  1. Ulcer of left foot, limited to breakdown of skin ([place])   2. Type 2 diabetes mellitus with left diabetic foot ulcer ([place])   3. H/O amputation of lesser toe, right ([place])     Plan: Patient will start dressing changes to the left foot starting tomorrow.  Follow-Up Instructions: Return in about 4 weeks (around 02/28/2018).   Ortho Exam  Patient is alert, oriented, no adenopathy, well-dressed, normal affect, normal respiratory effort. Examination patient's feet are plantigrade bilaterally with good pulses.  There are no ulcers on the plantar aspect of the right foot he does have some fibromatosis of the plantar fascia on the right.  The ulcer over the little toe is about 3 mm in diameter 0.1 mm deep a Band-Aid was applied no signs of infection.  Examination left foot he has a large Wegner grade 1 ulcer beneath the first metatarsal head of the left foot.  After informed consent a 10 blade knife was used to debride the skin and soft tissue back to healthy viable bleeding granulation tissue.  The ulcer is 3 cm in diameter and 5 mm deep.  There is no exposed bone or tendon there is healthy granulation tissue silver nitrate was used for hemostasis Bactroban 4 x 4 and a Covan wrap was applied.  Imaging: No results found. No images are attached to the  encounter.  Labs: Lab Results  Component Value Date   HGBA1C 6.7 (H) 06/07/2017   HGBA1C 10.3 (H) 08/17/2016   HGBA1C 7.7 (H) 12/18/2013   ESRSEDRATE 5 06/04/2017   ESRSEDRATE 110 (H) 12/19/2013   ESRSEDRATE 104 (H) 12/18/2013   CRP <0.8 06/05/2017   CRP 12.2 (H) 12/19/2013   CRP 14.9 (H) 12/18/2013   REPTSTATUS 12/24/2013 FINAL 12/18/2013   REPTSTATUS 12/24/2013 FINAL 12/18/2013   GRAMSTAIN  04/19/2011    WBC PRESENT,BOTH PMN AND MONONUCLEAR NO ORGANISMS SEEN   CULT  12/18/2013    NO GROWTH 5 DAYS Performed at [facility]  12/18/2013    NO GROWTH 5 DAYS Performed at [facility]      Lab Results  Component Value Date   ALBUMIN 3.8 06/05/2017   ALBUMIN 4.3 12/14/2016   ALBUMIN 3.7 09/19/2016    Body mass index is 22.5 kg/m.  Orders:  No orders of the defined types were placed in this encounter.  No orders of the defined types were placed in this encounter.    Procedures: No procedures performed  Clinical Data: No additional findings.  ROS:  All other systems negative, except as noted in the HPI. Review of Systems  Objective: Vital Signs: Ht 6\' 3"  (1.905 m)  Wt 180 lb (81.6 kg)   BMI 22.50 kg/m   Specialty Comments:  No specialty comments available.  PMFS History: Patient Active Problem List   Diagnosis Date Noted  . H/O amputation of lesser toe, right ([place]) 07/29/2017  . Diabetic foot infection ([place]) 06/22/2017  . Post op infection 06/22/2017  . SVT (supraventricular tachycardia) ([place]) 08/17/2016  . Tobacco use disorder 10/25/2014  . Diabetes mellitus type 2 in nonobese (HCC)   . Swelling   . Type 2 diabetes mellitus with left diabetic foot ulcer ([place]) 12/18/2013  . Diabetes mellitus due to underlying condition with foot ulcer ([place]) 12/18/2013  . Left leg cellulitis 12/18/2013  . Cocaine abuse ([place]) 03/29/2012  . Generalized anxiety disorder 03/29/2012  . Panic attacks 03/29/2012  . Alcohol dependence ([place])  08/05/2011    Class: Chronic  . Substance abuse ([place]) 05/31/2011  . Ulcer of left foot, limited to breakdown of skin ([place]) 02/15/2011  . Neuropathic ulcer of foot ([place]) 02/15/2011   Past Medical History:  Diagnosis Date  . Anxiety   . Arthritis    "everywhere" (04/21/2012)  . Cellulitis and abscess of foot 12/19/2013   LEFT FOOT  . Chronic pain   . DDD (degenerative disc disease)   . Depression   . Diabetes mellitus without complication (HCC)    borderline  . Diabetic foot ulcer ([place])   . ETOH abuse   . Full dentures   . GERD (gastroesophageal reflux disease)    tums  . History of blood transfusion    "related to left knee OR; probably right hip too" (04/21/2012)  . Mental disorder   . Neuromuscular disorder (HCC)    neuropathy  . Noncompliance   . Open wound    bottom of foot  . Osteomyelitis ([place])    right 4th toe  . Pneumonia ~ 2012  . Polysubstance abuse (HCC)    etoh, cocaine, heroin  . Stroke [facility]) 2008   "they said I might have had one during right hip replacement" (04/21/2012)  . Wears glasses     Family History  Problem Relation Age of Onset  . Diabetes Father     Past Surgical History:  Procedure Laterality Date  . AMPUTATION Right 06/09/2017   Procedure: RIGHT 2ND TOE AMPUTATION;  Surgeon: [name], MD;  Location: [facility];  Service: Orthopedics;  Laterality: Right;  . AMPUTATION Right 07/23/2017   Procedure: RIGHT 4TH TOE AMPUTATION;  Surgeon: [name], MD;  Location: [facility];  Service: Orthopedics;  Laterality: Right;  . JOINT REPLACEMENT    . KNEE ARTHROSCOPY Bilateral 1980's/1990's  . LUNG LOBECTOMY Left ~ 2006  . LUNG LOBECTOMY    . METATARSAL OSTEOTOMY  10/29/2011   Procedure: METATARSAL OSTEOTOMY;  Surgeon: [name], MD;  Location: [facility] [place];  Service: Orthopedics;  Laterality: Left;  Left 1st Metatarsal Dorsal Closing Wedge   . METATARSAL OSTEOTOMY Right 04/28/2017   Procedure: RIGHT 1ST METATARSAL DORSAL CLOSING WEDGE OSTEOTOMY;  Surgeon:  [name], MD;  Location: [facility];  Service: Orthopedics;  Laterality: Right;  . MULTIPLE TOOTH EXTRACTIONS    . REVISION TOTAL HIP ARTHROPLASTY Right 2008   "4-5 months after replacement" (04/21/2012)  . TOTAL HIP ARTHROPLASTY Right 2008  . TOTAL KNEE ARTHROPLASTY Left 2006  . TOTAL KNEE ARTHROPLASTY Right 04/20/2012  . TOTAL KNEE ARTHROPLASTY Right 04/20/2012   Procedure: TOTAL KNEE ARTHROPLASTY;  Surgeon: [name], MD;  Location: [facility];  Service: Orthopedics;  Laterality: Right;  Right Total Knee Arthroplasty   Social History   Occupational History  . Not on file  Tobacco Use  . Smoking status: Current Every Day Smoker    Packs/day: 1.00    Years: 30.00    Pack years: 30.00    Types: Cigarettes  . Smokeless tobacco: Never Used  Substance and Sexual Activity  . Alcohol use: No    Alcohol/week: 0.0 standard drinks    Frequency: Never  . Drug use: Not Currently    Types: Cocaine    Comment: Cocaine and heroin   . Sexual activity: Yes    Birth control/protection: None

## 2018-02-17 ENCOUNTER — Encounter (INDEPENDENT_AMBULATORY_CARE_PROVIDER_SITE_OTHER): Payer: Self-pay | Admitting: Orthopedic Surgery

## 2018-02-17 ENCOUNTER — Ambulatory Visit (INDEPENDENT_AMBULATORY_CARE_PROVIDER_SITE_OTHER): Payer: Medicare Other | Admitting: Physician Assistant

## 2018-02-17 VITALS — Ht 75.0 in | Wt 180.0 lb

## 2018-02-17 DIAGNOSIS — L97521 Non-pressure chronic ulcer of other part of left foot limited to breakdown of skin: Secondary | ICD-10-CM

## 2018-02-17 DIAGNOSIS — Z89421 Acquired absence of other right toe(s): Secondary | ICD-10-CM | POA: Diagnosis not present

## 2018-02-17 DIAGNOSIS — E11621 Type 2 diabetes mellitus with foot ulcer: Secondary | ICD-10-CM

## 2018-02-18 NOTE — Progress Notes (Signed)
Office Visit Note   Patient: [name]           Date of Birth: November 21, 1965           MRN: [id number] Visit Date: 02/17/2018              Requested by: Lucia Gaskins, MD [address] [place], [place] [address] PCP: Lucia Gaskins, MD  Chief Complaint  Patient presents with  . Right Foot - Follow-up  . Left Foot - Follow-up      HPI: The patient is a 52 year old gentleman who is seen for postoperative follow-up following right fourth toe amputation on 07/23/2017.  On the left foot he continues to have a Waggoner grade 1 ulcer of the left first metatarsal head.  He has obtained his shoes and orthotics but may need further adjustment of these as he continues to get callus and pressure over the left first metatarsal head.  He reports the right foot is well-healed.  Assessment & Plan: Visit Diagnoses:  1. Ulcer of left foot, limited to breakdown of skin ([place])   2. H/O amputation of lesser toe, right (HCC)     Plan: After informed consent the left first metatarsal head plantar ulcer was debrided back to healthy tissue and the patient tolerated this well.  He will follow-up with [facility] to have his orthotics adjusted for the left foot.  He will follow-up in 4 weeks or sooner should he have difficulties in the interim.  Follow-Up Instructions: Return in about 4 weeks (around 03/17/2018).   Ortho Exam  Patient is alert, oriented, no adenopathy, well-dressed, normal affect, normal respiratory effort. Right foot is well-healed.  Left first metatarsal head ulcer was debrided with a #10 blade knife to healthy-appearing tissue.  Silver nitrate was used for hemostasis.  There are no signs of infection or cellulitis.  He does have some early callus over the left lateral foot over the fifth metatarsal head but no evidence for ulceration.  He has good palpable pedal pulses bilaterally.  There is no edema.  Imaging: No results found. No images are attached to the  encounter.  Labs: Lab Results  Component Value Date   HGBA1C 6.7 (H) 06/07/2017   HGBA1C 10.3 (H) 08/17/2016   HGBA1C 7.7 (H) 12/18/2013   ESRSEDRATE 5 06/04/2017   ESRSEDRATE 110 (H) 12/19/2013   ESRSEDRATE 104 (H) 12/18/2013   CRP <0.8 06/05/2017   CRP 12.2 (H) 12/19/2013   CRP 14.9 (H) 12/18/2013   REPTSTATUS 12/24/2013 FINAL 12/18/2013   REPTSTATUS 12/24/2013 FINAL 12/18/2013   GRAMSTAIN  04/19/2011    WBC PRESENT,BOTH PMN AND MONONUCLEAR NO ORGANISMS SEEN   CULT  12/18/2013    NO GROWTH 5 DAYS Performed at [facility]  12/18/2013    NO GROWTH 5 DAYS Performed at [facility]      Lab Results  Component Value Date   ALBUMIN 3.8 06/05/2017   ALBUMIN 4.3 12/14/2016   ALBUMIN 3.7 09/19/2016    Body mass index is 22.5 kg/m.  Orders:  No orders of the defined types were placed in this encounter.  No orders of the defined types were placed in this encounter.    Procedures: No procedures performed  Clinical Data: No additional findings.  ROS:  All other systems negative, except as noted in the HPI. Review of Systems  Objective: Vital Signs: Ht 6\' 3"  (1.905 m)   Wt 180 lb (81.6 kg)   BMI 22.50 kg/m  Specialty Comments:  No specialty comments available.  PMFS History: Patient Active Problem List   Diagnosis Date Noted  . H/O amputation of lesser toe, right ([place]) 07/29/2017  . Diabetic foot infection ([place]) 06/22/2017  . Post op infection 06/22/2017  . SVT (supraventricular tachycardia) ([place]) 08/17/2016  . Tobacco use disorder 10/25/2014  . Diabetes mellitus type 2 in nonobese (HCC)   . Swelling   . Type 2 diabetes mellitus with left diabetic foot ulcer ([place]) 12/18/2013  . Diabetes mellitus due to underlying condition with foot ulcer ([place]) 12/18/2013  . Left leg cellulitis 12/18/2013  . Cocaine abuse ([place]) 03/29/2012  . Generalized anxiety disorder 03/29/2012  . Panic attacks 03/29/2012  . Alcohol dependence ([place])  08/05/2011    Class: Chronic  . Substance abuse ([place]) 05/31/2011  . Ulcer of left foot, limited to breakdown of skin ([place]) 02/15/2011  . Neuropathic ulcer of foot ([place]) 02/15/2011   Past Medical History:  Diagnosis Date  . Anxiety   . Arthritis    "everywhere" (04/21/2012)  . Cellulitis and abscess of foot 12/19/2013   LEFT FOOT  . Chronic pain   . DDD (degenerative disc disease)   . Depression   . Diabetes mellitus without complication (HCC)    borderline  . Diabetic foot ulcer ([place])   . ETOH abuse   . Full dentures   . GERD (gastroesophageal reflux disease)    tums  . History of blood transfusion    "related to left knee OR; probably right hip too" (04/21/2012)  . Mental disorder   . Neuromuscular disorder (HCC)    neuropathy  . Noncompliance   . Open wound    bottom of foot  . Osteomyelitis ([place])    right 4th toe  . Pneumonia ~ 2012  . Polysubstance abuse (HCC)    etoh, cocaine, heroin  . Stroke [facility]) 2008   "they said I might have had one during right hip replacement" (04/21/2012)  . Wears glasses     Family History  Problem Relation Age of Onset  . Diabetes Father     Past Surgical History:  Procedure Laterality Date  . AMPUTATION Right 06/09/2017   Procedure: RIGHT 2ND TOE AMPUTATION;  Surgeon: [name], MD;  Location: [facility] [place];  Service: Orthopedics;  Laterality: Right;  . AMPUTATION Right 07/23/2017   Procedure: RIGHT 4TH TOE AMPUTATION;  Surgeon: [name], MD;  Location: [facility] [place];  Service: Orthopedics;  Laterality: Right;  . JOINT REPLACEMENT    . KNEE ARTHROSCOPY Bilateral 1980's/1990's  . LUNG LOBECTOMY Left ~ 2006  . LUNG LOBECTOMY    . METATARSAL OSTEOTOMY  10/29/2011   Procedure: METATARSAL OSTEOTOMY;  Surgeon: [name], MD;  Location: [facility];  Service: Orthopedics;  Laterality: Left;  Left 1st Metatarsal Dorsal Closing Wedge   . METATARSAL OSTEOTOMY Right 04/28/2017   Procedure: RIGHT 1ST METATARSAL DORSAL CLOSING WEDGE OSTEOTOMY;  Surgeon:  [name], MD;  Location: [facility] [place];  Service: Orthopedics;  Laterality: Right;  . MULTIPLE TOOTH EXTRACTIONS    . REVISION TOTAL HIP ARTHROPLASTY Right 2008   "4-5 months after replacement" (04/21/2012)  . TOTAL HIP ARTHROPLASTY Right 2008  . TOTAL KNEE ARTHROPLASTY Left 2006  . TOTAL KNEE ARTHROPLASTY Right 04/20/2012  . TOTAL KNEE ARTHROPLASTY Right 04/20/2012   Procedure: TOTAL KNEE ARTHROPLASTY;  Surgeon: [name], MD;  Location: [facility];  Service: Orthopedics;  Laterality: Right;  Right Total Knee Arthroplasty   Social History   Occupational History  .  Not on file  Tobacco Use  . Smoking status: Current Every Day Smoker    Packs/day: 1.00    Years: 30.00    Pack years: 30.00    Types: Cigarettes  . Smokeless tobacco: Never Used  Substance and Sexual Activity  . Alcohol use: No    Alcohol/week: 0.0 standard drinks    Frequency: Never  . Drug use: Not Currently    Types: Cocaine    Comment: Cocaine and heroin   . Sexual activity: Yes    Birth control/protection: None

## 2018-02-20 ENCOUNTER — Encounter (INDEPENDENT_AMBULATORY_CARE_PROVIDER_SITE_OTHER): Payer: Self-pay | Admitting: Physician Assistant

## 2018-02-21 ENCOUNTER — Ambulatory Visit (INDEPENDENT_AMBULATORY_CARE_PROVIDER_SITE_OTHER): Payer: Medicare Other | Admitting: Orthopedic Surgery

## 2018-03-21 ENCOUNTER — Encounter (INDEPENDENT_AMBULATORY_CARE_PROVIDER_SITE_OTHER): Payer: Self-pay | Admitting: Orthopedic Surgery

## 2018-03-21 ENCOUNTER — Ambulatory Visit (INDEPENDENT_AMBULATORY_CARE_PROVIDER_SITE_OTHER): Payer: Medicare Other | Admitting: Orthopedic Surgery

## 2018-03-21 VITALS — Ht 75.0 in | Wt 180.0 lb

## 2018-03-21 DIAGNOSIS — L97521 Non-pressure chronic ulcer of other part of left foot limited to breakdown of skin: Secondary | ICD-10-CM

## 2018-03-21 DIAGNOSIS — L97529 Non-pressure chronic ulcer of other part of left foot with unspecified severity: Secondary | ICD-10-CM

## 2018-03-21 DIAGNOSIS — E11621 Type 2 diabetes mellitus with foot ulcer: Secondary | ICD-10-CM | POA: Diagnosis not present

## 2018-03-21 DIAGNOSIS — Z89421 Acquired absence of other right toe(s): Secondary | ICD-10-CM | POA: Diagnosis not present

## 2018-03-21 NOTE — Progress Notes (Signed)
Office Visit Note   Patient: [name]           Date of Birth: 1965-02-13           MRN: [id number] Visit Date: 03/21/2018              Requested by: Lucia Gaskins, MD [address] [place], [place] [address] PCP: Lucia Gaskins, MD  Chief Complaint  Patient presents with  . Left Foot - Follow-up  . Right Foot - Follow-up      HPI: Patient is a 53 year old gentleman with diabetic insensate neuropathy status post fourth toe amputation the right foot who presents in follow-up for bilateral lower extremities.  Patient states the left foot ulcer is worse denies any swelling or pain denies any odor or drainage.  Assessment & Plan: Visit Diagnoses:  1. Ulcer of left foot, limited to breakdown of skin ([place])   2. Type 2 diabetes mellitus with left diabetic foot ulcer ([place])   3. H/O amputation of lesser toe, right (HCC)     Plan: Left first metatarsal head ulcer was debrided.  He has new orthotics these have been modified to unload pressure from the first metatarsal head.  Reevaluate in 4 weeks.  Follow-Up Instructions: Return in about 4 weeks (around 04/18/2018).   Ortho Exam  Patient is alert, oriented, no adenopathy, well-dressed, normal affect, normal respiratory effort. On examination patient has a good dorsalis pedis pulse he has dorsiflexion to neutral.  He does have a plantarflexed first ray bilaterally.  Left foot has increased ulceration and callus.  After informed consent a 10 blade knife was used to debride the skin and soft tissue back to healthy viable granulation tissue this was touched with silver nitrate.  The ulcer is 3 cm in diameter and 3 mm deep after debridement.  There is good granulation tissue there is no exposed bone or tendon.   Imaging: No results found. No images are attached to the encounter.  Labs: Lab Results  Component Value Date   HGBA1C 6.7 (H) 06/07/2017   HGBA1C 10.3 (H) 08/17/2016   HGBA1C 7.7 (H) 12/18/2013   ESRSEDRATE 5  06/04/2017   ESRSEDRATE 110 (H) 12/19/2013   ESRSEDRATE 104 (H) 12/18/2013   CRP <0.8 06/05/2017   CRP 12.2 (H) 12/19/2013   CRP 14.9 (H) 12/18/2013   REPTSTATUS 12/24/2013 FINAL 12/18/2013   REPTSTATUS 12/24/2013 FINAL 12/18/2013   GRAMSTAIN  04/19/2011    WBC PRESENT,BOTH PMN AND MONONUCLEAR NO ORGANISMS SEEN   CULT  12/18/2013    NO GROWTH 5 DAYS Performed at [facility]  12/18/2013    NO GROWTH 5 DAYS Performed at [facility]      Lab Results  Component Value Date   ALBUMIN 3.8 06/05/2017   ALBUMIN 4.3 12/14/2016   ALBUMIN 3.7 09/19/2016    Body mass index is 22.5 kg/m.  Orders:  No orders of the defined types were placed in this encounter.  No orders of the defined types were placed in this encounter.    Procedures: No procedures performed  Clinical Data: No additional findings.  ROS:  All other systems negative, except as noted in the HPI. Review of Systems  Objective: Vital Signs: Ht 6\' 3"  (1.905 m)   Wt 180 lb (81.6 kg)   BMI 22.50 kg/m   Specialty Comments:  No specialty comments available.  PMFS History: Patient Active Problem List   Diagnosis Date Noted  . H/O amputation of lesser  toe, right ([place]) 07/29/2017  . Diabetic foot infection ([place]) 06/22/2017  . Post op infection 06/22/2017  . SVT (supraventricular tachycardia) ([place]) 08/17/2016  . Tobacco use disorder 10/25/2014  . Diabetes mellitus type 2 in nonobese (HCC)   . Swelling   . Type 2 diabetes mellitus with left diabetic foot ulcer ([place]) 12/18/2013  . Diabetes mellitus due to underlying condition with foot ulcer ([place]) 12/18/2013  . Left leg cellulitis 12/18/2013  . Cocaine abuse ([place]) 03/29/2012  . Generalized anxiety disorder 03/29/2012  . Panic attacks 03/29/2012  . Alcohol dependence ([place]) 08/05/2011    Class: Chronic  . Substance abuse ([place]) 05/31/2011  . Ulcer of left foot, limited to breakdown of skin ([place]) 02/15/2011  . Neuropathic ulcer of  foot ([place]) 02/15/2011   Past Medical History:  Diagnosis Date  . Anxiety   . Arthritis    "everywhere" (04/21/2012)  . Cellulitis and abscess of foot 12/19/2013   LEFT FOOT  . Chronic pain   . DDD (degenerative disc disease)   . Depression   . Diabetes mellitus without complication (HCC)    borderline  . Diabetic foot ulcer ([place])   . ETOH abuse   . Full dentures   . GERD (gastroesophageal reflux disease)    tums  . History of blood transfusion    "related to left knee OR; probably right hip too" (04/21/2012)  . Mental disorder   . Neuromuscular disorder (HCC)    neuropathy  . Noncompliance   . Open wound    bottom of foot  . Osteomyelitis ([place])    right 4th toe  . Pneumonia ~ 2012  . Polysubstance abuse (HCC)    etoh, cocaine, heroin  . Stroke [facility]) 2008   "they said I might have had one during right hip replacement" (04/21/2012)  . Wears glasses     Family History  Problem Relation Age of Onset  . Diabetes Father     Past Surgical History:  Procedure Laterality Date  . AMPUTATION Right 06/09/2017   Procedure: RIGHT 2ND TOE AMPUTATION;  Surgeon: [name], MD;  Location: [facility] [place];  Service: Orthopedics;  Laterality: Right;  . AMPUTATION Right 07/23/2017   Procedure: RIGHT 4TH TOE AMPUTATION;  Surgeon: [name], MD;  Location: [place];  Service: Orthopedics;  Laterality: Right;  . JOINT REPLACEMENT    . KNEE ARTHROSCOPY Bilateral 1980's/1990's  . LUNG LOBECTOMY Left ~ 2006  . LUNG LOBECTOMY    . METATARSAL OSTEOTOMY  10/29/2011   Procedure: METATARSAL OSTEOTOMY;  Surgeon: [name], MD;  Location: [place];  Service: Orthopedics;  Laterality: Left;  Left 1st Metatarsal Dorsal Closing Wedge   . METATARSAL OSTEOTOMY Right 04/28/2017   Procedure: RIGHT 1ST METATARSAL DORSAL CLOSING WEDGE OSTEOTOMY;  Surgeon: [name], MD;  Location: [place];  Service: Orthopedics;  Laterality: Right;  . MULTIPLE TOOTH EXTRACTIONS    . REVISION TOTAL HIP ARTHROPLASTY Right 2008    "4-5 months after replacement" (04/21/2012)  . TOTAL HIP ARTHROPLASTY Right 2008  . TOTAL KNEE ARTHROPLASTY Left 2006  . TOTAL KNEE ARTHROPLASTY Right 04/20/2012  . TOTAL KNEE ARTHROPLASTY Right 04/20/2012   Procedure: TOTAL KNEE ARTHROPLASTY;  Surgeon: [name], MD;  Location: [place];  Service: Orthopedics;  Laterality: Right;  Right Total Knee Arthroplasty   Social History   Occupational History  . Not on file  Tobacco Use  . Smoking status: Current Every Day Smoker    Packs/day: 1.00    Years:  30.00    Pack years: 30.00    Types: Cigarettes  . Smokeless tobacco: Never Used  Substance and Sexual Activity  . Alcohol use: No    Alcohol/week: 0.0 standard drinks    Frequency: Never  . Drug use: Not Currently    Types: Cocaine    Comment: Cocaine and heroin   . Sexual activity: Yes    Birth control/protection: None

## 2018-04-19 ENCOUNTER — Telehealth (INDEPENDENT_AMBULATORY_CARE_PROVIDER_SITE_OTHER): Payer: Self-pay

## 2018-04-19 NOTE — Telephone Encounter (Signed)
I called pt and he answered NO to all COVID-19 questions. Pt has an appt tomorrow @ 11:15am

## 2018-04-20 ENCOUNTER — Encounter (INDEPENDENT_AMBULATORY_CARE_PROVIDER_SITE_OTHER): Payer: Self-pay | Admitting: Family

## 2018-04-20 ENCOUNTER — Other Ambulatory Visit: Payer: Self-pay

## 2018-04-20 ENCOUNTER — Ambulatory Visit (INDEPENDENT_AMBULATORY_CARE_PROVIDER_SITE_OTHER): Payer: Medicare Other | Admitting: Family

## 2018-04-20 VITALS — Ht 75.0 in | Wt 180.0 lb

## 2018-04-20 DIAGNOSIS — L97529 Non-pressure chronic ulcer of other part of left foot with unspecified severity: Secondary | ICD-10-CM

## 2018-04-20 DIAGNOSIS — E11621 Type 2 diabetes mellitus with foot ulcer: Secondary | ICD-10-CM

## 2018-04-20 NOTE — Progress Notes (Signed)
Office Visit Note   Patient: [name]           Date of Birth: Jan 02, 1966           MRN: [id number] Visit Date: 04/20/2018              Requested by: Lucia Gaskins, MD [address] [place], [place] [address] PCP: Lucia Gaskins, MD  Chief Complaint  Patient presents with  . 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 Denies any swelling or pain denies any odor or drainage. Has been unable to keep weight off the foot.   Assessment & Plan: Visit Diagnoses:  1. Type 2 diabetes mellitus with left diabetic foot ulcer ([place])     Plan: Left first metatarsal head ulcer was debrided.  He has new orthotics these have been modified to unload pressure from the first metatarsal head. He is pleased with these. Discussed return precautions.  Reevaluate in 4 weeks.  Follow-Up Instructions: Return in about 4 weeks (around 05/18/2018).   Ortho Exam  Patient is alert, oriented, no adenopathy, well-dressed, normal affect, normal respiratory effort. On examination patient has a good dorsalis pedis pulse he has dorsiflexion to neutral.  He does have a plantarflexed first ray bilaterally.  Left foot has increased ulceration and callus.  After informed consent a 10 blade knife was used to debride the skin and soft tissue back to healthy viable granulation tissue this was touched with silver nitrate.  The ulcer is 3 cm in diameter and 5 mm deep after debridement.  There is good granulation tissue there is no exposed bone or tendon.   Imaging: No results found. No images are attached to the encounter.  Labs: Lab Results  Component Value Date   HGBA1C 6.7 (H) 06/07/2017   HGBA1C 10.3 (H) 08/17/2016   HGBA1C 7.7 (H) 12/18/2013   ESRSEDRATE 5 06/04/2017   ESRSEDRATE 110 (H) 12/19/2013   ESRSEDRATE 104 (H) 12/18/2013   CRP <0.8  06/05/2017   CRP 12.2 (H) 12/19/2013   CRP 14.9 (H) 12/18/2013   REPTSTATUS 12/24/2013 FINAL 12/18/2013   REPTSTATUS 12/24/2013 FINAL 12/18/2013   GRAMSTAIN  04/19/2011    WBC PRESENT,BOTH PMN AND MONONUCLEAR NO ORGANISMS SEEN   CULT  12/18/2013    NO GROWTH 5 DAYS Performed at [facility]  12/18/2013    NO GROWTH 5 DAYS Performed at [facility]      Lab Results  Component Value Date   ALBUMIN 3.8 06/05/2017   ALBUMIN 4.3 12/14/2016   ALBUMIN 3.7 09/19/2016    Body mass index is 22.5 kg/m.  Orders:  No orders of the defined types were placed in this encounter.  No orders of the defined types were placed in this encounter.    Procedures: No procedures performed  Clinical Data: No additional findings.  ROS:  All other systems negative, except as noted in the HPI. Review of Systems  Constitutional: Negative for chills and fever.  Skin: Positive for wound. Negative for color change.    Objective: Vital Signs: Ht 6\' 3"  (1.905 m)   Wt 180 lb (81.6 kg)   BMI 22.50 kg/m   Specialty Comments:  No specialty comments available.  PMFS History: Patient Active Problem List   Diagnosis Date Noted  . H/O amputation of lesser toe,  right ([place]) 07/29/2017  . Diabetic foot infection ([place]) 06/22/2017  . Post op infection 06/22/2017  . SVT (supraventricular tachycardia) ([place]) 08/17/2016  . Tobacco use disorder 10/25/2014  . Diabetes mellitus type 2 in nonobese (HCC)   . Swelling   . Type 2 diabetes mellitus with left diabetic foot ulcer ([place]) 12/18/2013  . Diabetes mellitus due to underlying condition with foot ulcer ([place]) 12/18/2013  . Left leg cellulitis 12/18/2013  . Cocaine abuse ([place]) 03/29/2012  . Generalized anxiety disorder 03/29/2012  . Panic attacks 03/29/2012  . Alcohol dependence ([place]) 08/05/2011    Class: Chronic  . Substance abuse ([place]) 05/31/2011  . Ulcer of left foot, limited to breakdown of skin ([place]) 02/15/2011  .  Neuropathic ulcer of foot ([place]) 02/15/2011   Past Medical History:  Diagnosis Date  . Anxiety   . Arthritis    "everywhere" (04/21/2012)  . Cellulitis and abscess of foot 12/19/2013   LEFT FOOT  . Chronic pain   . DDD (degenerative disc disease)   . Depression   . Diabetes mellitus without complication (HCC)    borderline  . Diabetic foot ulcer ([place])   . ETOH abuse   . Full dentures   . GERD (gastroesophageal reflux disease)    tums  . History of blood transfusion    "related to left knee OR; probably right hip too" (04/21/2012)  . Mental disorder   . Neuromuscular disorder (HCC)    neuropathy  . Noncompliance   . Open wound    bottom of foot  . Osteomyelitis ([place])    right 4th toe  . Pneumonia ~ 2012  . Polysubstance abuse (HCC)    etoh, cocaine, heroin  . Stroke [facility]) 2008   "they said I might have had one during right hip replacement" (04/21/2012)  . Wears glasses     Family History  Problem Relation Age of Onset  . Diabetes Father     Past Surgical History:  Procedure Laterality Date  . AMPUTATION Right 06/09/2017   Procedure: RIGHT 2ND TOE AMPUTATION;  Surgeon: [name], MD;  Location: [facility];  Service: Orthopedics;  Laterality: Right;  . AMPUTATION Right 07/23/2017   Procedure: RIGHT 4TH TOE AMPUTATION;  Surgeon: [name], MD;  Location: [facility];  Service: Orthopedics;  Laterality: Right;  . JOINT REPLACEMENT    . KNEE ARTHROSCOPY Bilateral 1980's/1990's  . LUNG LOBECTOMY Left ~ 2006  . LUNG LOBECTOMY    . METATARSAL OSTEOTOMY  10/29/2011   Procedure: METATARSAL OSTEOTOMY;  Surgeon: [name], MD;  Location: [place];  Service: Orthopedics;  Laterality: Left;  Left 1st Metatarsal Dorsal Closing Wedge   . METATARSAL OSTEOTOMY Right 04/28/2017   Procedure: RIGHT 1ST METATARSAL DORSAL CLOSING WEDGE OSTEOTOMY;  Surgeon: [name], MD;  Location: [place];  Service: Orthopedics;  Laterality: Right;  . MULTIPLE TOOTH EXTRACTIONS    . REVISION TOTAL HIP  ARTHROPLASTY Right 2008   "4-5 months after replacement" (04/21/2012)  . TOTAL HIP ARTHROPLASTY Right 2008  . TOTAL KNEE ARTHROPLASTY Left 2006  . TOTAL KNEE ARTHROPLASTY Right 04/20/2012  . TOTAL KNEE ARTHROPLASTY Right 04/20/2012   Procedure: TOTAL KNEE ARTHROPLASTY;  Surgeon: [name], MD;  Location: [place];  Service: Orthopedics;  Laterality: Right;  Right Total Knee Arthroplasty   Social History   Occupational History  . Not on file  Tobacco Use  . Smoking status: Current Every Day Smoker    Packs/day: 1.00    Years: 30.00  Pack years: 30.00    Types: Cigarettes  . Smokeless tobacco: Never Used  Substance and Sexual Activity  . Alcohol use: No    Alcohol/week: 0.0 standard drinks    Frequency: Never  . Drug use: Not Currently    Types: Cocaine    Comment: Cocaine and heroin   . Sexual activity: Yes    Birth control/protection: None

## 2018-04-21 ENCOUNTER — Ambulatory Visit (INDEPENDENT_AMBULATORY_CARE_PROVIDER_SITE_OTHER): Payer: Medicare Other | Admitting: Orthopedic Surgery

## 2018-05-25 ENCOUNTER — Encounter: Payer: Self-pay | Admitting: Family

## 2018-05-25 ENCOUNTER — Ambulatory Visit (INDEPENDENT_AMBULATORY_CARE_PROVIDER_SITE_OTHER): Payer: Medicare Other | Admitting: Family

## 2018-05-25 ENCOUNTER — Other Ambulatory Visit: Payer: Self-pay

## 2018-05-25 VITALS — Ht 75.0 in | Wt 180.0 lb

## 2018-05-25 DIAGNOSIS — Z89421 Acquired absence of other right toe(s): Secondary | ICD-10-CM | POA: Diagnosis not present

## 2018-05-25 DIAGNOSIS — L97521 Non-pressure chronic ulcer of other part of left foot limited to breakdown of skin: Secondary | ICD-10-CM

## 2018-05-25 DIAGNOSIS — L97512 Non-pressure chronic ulcer of other part of right foot with fat layer exposed: Secondary | ICD-10-CM | POA: Diagnosis not present

## 2018-05-25 MED ORDER — DOXYCYCLINE HYCLATE 100 MG PO TABS: 100.0000 mg | ORAL_TABLET | Freq: Two times a day (BID) | ORAL | 0 refills | Status: DC

## 2018-05-25 MED ORDER — SILVER SULFADIAZINE 1 % EX CREA: 1.0000 "application " | TOPICAL_CREAM | Freq: Every day | CUTANEOUS | 0 refills | Status: DC

## 2018-05-25 NOTE — Progress Notes (Signed)
Office Visit Note   Patient: [name]           Date of Birth: 1965-02-15           MRN: [id number] Visit Date: 05/25/2018              Requested by: Lucia Gaskins, MD [address] [place], [place] [address] PCP: Lucia Gaskins, MD  Chief Complaint  Patient presents with  . Left Foot - Wound Check  . 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 Also complaining of new ulcer to right foot beneath the 3rd metatarsal head. Denies increase in his activities. Both foot ulcers are painful. Denies any swelling. denies any odor or drainage. No systemic symptoms. Has been unable to keep weight off the foot.   Has been doing neosporin dressings in regular shoe wear with custom orthotics.  Assessment & Plan: Visit Diagnoses:  1. Ulcer of left foot, limited to breakdown of skin ([place])   2. Neuropathic ulcer of right foot with fat layer exposed ([place])   3. H/O amputation of lesser toe, right (HCC)     Plan: right foot ulcer with concerning depth and probing. Discussed offloading and wound care. Will initiate silvadene to bilateral ulcers. Left first metatarsal head ulcer and right foot ulcer were debrided.  He has orthotics these have been modified to unload pressure from the first metatarsal head which he received in January. States was fitted for these in November, feels his foot and bony prominences have changed since fitting for orthotics. Will have him orthotics reevaluated. Order provided. Discussed return precautions.  Reevaluate in 2 weeks.  Follow-Up Instructions: Return in about 2 weeks (around 06/08/2018).   Ortho Exam  Patient is alert, oriented, no adenopathy, well-dressed, normal affect, normal respiratory effort. On examination patient has a good dorsalis pedis pulse he  has dorsiflexion to neutral.  He does have a plantarflexed first ray bilaterally.  Left foot has increased ulceration and callus. Proud granulation centrally. After informed consent a 10 blade knife was used to debride the skin and soft tissue back to healthy viable granulation tissue this was touched with silver nitrate.  The ulcer is 3 cm in diameter and 5 mm deep after debridement. Granulation a diameter of 5 mm. Fissure through center of ulcer with bleeding.  There is good granulation tissue there is no exposed bone or tendon. The right foot with ulcer beneath the 3rd MT head. This is 2 cm in diameter with a central proud granulation. This is 5 mm in diameter, probes 6 mm deep. Does not probe to bone. No purulence or surrounding erythema. Is tender. No sign of ascending cellulitis.  Imaging: No results found. No images are attached to the encounter.  Labs: Lab Results  Component Value Date   HGBA1C 6.7 (H) 06/07/2017   HGBA1C 10.3 (H) 08/17/2016   HGBA1C 7.7 (H) 12/18/2013   ESRSEDRATE 5 06/04/2017   ESRSEDRATE 110 (H) 12/19/2013   ESRSEDRATE 104 (H) 12/18/2013   CRP <0.8 06/05/2017   CRP 12.2 (H) 12/19/2013   CRP 14.9 (H) 12/18/2013   REPTSTATUS 12/24/2013 FINAL 12/18/2013   REPTSTATUS 12/24/2013 FINAL 12/18/2013   GRAMSTAIN  04/19/2011    WBC PRESENT,BOTH PMN AND MONONUCLEAR NO ORGANISMS SEEN   CULT  12/18/2013  NO GROWTH 5 DAYS Performed at [facility]  12/18/2013    NO GROWTH 5 DAYS Performed at [facility]      Lab Results  Component Value Date   ALBUMIN 3.8 06/05/2017   ALBUMIN 4.3 12/14/2016   ALBUMIN 3.7 09/19/2016    Body mass index is 22.5 kg/m.  Orders:  No orders of the defined types were placed in this encounter.  Meds ordered this encounter  Medications  . silver sulfADIAZINE (SILVADENE) 1 % cream    Sig: Apply 1 application topically daily.    Dispense:  50 g    Refill:  0  . doxycycline (VIBRA-TABS) 100 MG tablet     Sig: Take 1 tablet (100 mg total) by mouth 2 (two) times daily.    Dispense:  60 tablet    Refill:  0     Procedures: No procedures performed  Clinical Data: No additional findings.  ROS:  All other systems negative, except as noted in the HPI. Review of Systems  Constitutional: Negative for chills and fever.  Skin: Positive for wound. Negative for color change.    Objective: Vital Signs: Ht 6\' 3"  (1.905 m)   Wt 180 lb (81.6 kg)   BMI 22.50 kg/m   Specialty Comments:  No specialty comments available.  PMFS History: Patient Active Problem List   Diagnosis Date Noted  . H/O amputation of lesser toe, right ([place]) 07/29/2017  . Diabetic foot infection ([place]) 06/22/2017  . Post op infection 06/22/2017  . SVT (supraventricular tachycardia) ([place]) 08/17/2016  . Tobacco use disorder 10/25/2014  . Diabetes mellitus type 2 in nonobese (HCC)   . Swelling   . Type 2 diabetes mellitus with left diabetic foot ulcer ([place]) 12/18/2013  . Diabetes mellitus due to underlying condition with foot ulcer ([place]) 12/18/2013  . Left leg cellulitis 12/18/2013  . Cocaine abuse ([place]) 03/29/2012  . Generalized anxiety disorder 03/29/2012  . Panic attacks 03/29/2012  . Alcohol dependence ([place]) 08/05/2011    Class: Chronic  . Substance abuse ([place]) 05/31/2011  . Ulcer of left foot, limited to breakdown of skin ([place]) 02/15/2011  . Neuropathic ulcer of foot ([place]) 02/15/2011   Past Medical History:  Diagnosis Date  . Anxiety   . Arthritis    "everywhere" (04/21/2012)  . Cellulitis and abscess of foot 12/19/2013   LEFT FOOT  . Chronic pain   . DDD (degenerative disc disease)   . Depression   . Diabetes mellitus without complication (HCC)    borderline  . Diabetic foot ulcer ([place])   . ETOH abuse   . Full dentures   . GERD (gastroesophageal reflux disease)    tums  . History of blood transfusion    "related to left knee OR; probably right hip too" (04/21/2012)  . Mental disorder   .  Neuromuscular disorder (HCC)    neuropathy  . Noncompliance   . Open wound    bottom of foot  . Osteomyelitis ([place])    right 4th toe  . Pneumonia ~ 2012  . Polysubstance abuse (HCC)    etoh, cocaine, heroin  . Stroke [facility]) 2008   "they said I might have had one during right hip replacement" (04/21/2012)  . Wears glasses     Family History  Problem Relation Age of Onset  . Diabetes Father     Past Surgical History:  Procedure Laterality Date  . AMPUTATION Right 06/09/2017   Procedure: RIGHT 2ND TOE AMPUTATION;  Surgeon: [name], MD;  Location: [place];  Service: Orthopedics;  Laterality: Right;  . AMPUTATION Right 07/23/2017   Procedure: RIGHT 4TH TOE AMPUTATION;  Surgeon: [name], MD;  Location: [place];  Service: Orthopedics;  Laterality: Right;  . JOINT REPLACEMENT    . KNEE ARTHROSCOPY Bilateral 1980's/1990's  . LUNG LOBECTOMY Left ~ 2006  . LUNG LOBECTOMY    . METATARSAL OSTEOTOMY  10/29/2011   Procedure: METATARSAL OSTEOTOMY;  Surgeon: [name], MD;  Location: [place];  Service: Orthopedics;  Laterality: Left;  Left 1st Metatarsal Dorsal Closing Wedge   . METATARSAL OSTEOTOMY Right 04/28/2017   Procedure: RIGHT 1ST METATARSAL DORSAL CLOSING WEDGE OSTEOTOMY;  Surgeon: [name], MD;  Location: [place];  Service: Orthopedics;  Laterality: Right;  . MULTIPLE TOOTH EXTRACTIONS    . REVISION TOTAL HIP ARTHROPLASTY Right 2008   "4-5 months after replacement" (04/21/2012)  . TOTAL HIP ARTHROPLASTY Right 2008  . TOTAL KNEE ARTHROPLASTY Left 2006  . TOTAL KNEE ARTHROPLASTY Right 04/20/2012  . TOTAL KNEE ARTHROPLASTY Right 04/20/2012   Procedure: TOTAL KNEE ARTHROPLASTY;  Surgeon: [name], MD;  Location: [place];  Service: Orthopedics;  Laterality: Right;  Right Total Knee Arthroplasty   Social History   Occupational History  . Not on file  Tobacco Use  . Smoking status: Current Every Day Smoker    Packs/day: 1.00    Years: 30.00    Pack years: 30.00    Types:  Cigarettes  . Smokeless tobacco: Never Used  Substance and Sexual Activity  . Alcohol use: No    Alcohol/week: 0.0 standard drinks    Frequency: Never  . Drug use: Not Currently    Types: Cocaine    Comment: Cocaine and heroin   . Sexual activity: Yes    Birth control/protection: None

## 2018-06-08 ENCOUNTER — Ambulatory Visit: Payer: Medicare Other

## 2018-06-08 ENCOUNTER — Ambulatory Visit: Payer: Medicare Other | Admitting: Orthopedic Surgery

## 2018-06-08 ENCOUNTER — Ambulatory Visit (INDEPENDENT_AMBULATORY_CARE_PROVIDER_SITE_OTHER): Payer: Medicare Other | Admitting: Family

## 2018-06-08 ENCOUNTER — Other Ambulatory Visit: Payer: Self-pay

## 2018-06-08 ENCOUNTER — Ambulatory Visit (INDEPENDENT_AMBULATORY_CARE_PROVIDER_SITE_OTHER): Payer: Medicare Other

## 2018-06-08 ENCOUNTER — Encounter: Payer: Self-pay | Admitting: Family

## 2018-06-08 VITALS — Ht 75.0 in | Wt 180.0 lb

## 2018-06-08 DIAGNOSIS — L97521 Non-pressure chronic ulcer of other part of left foot limited to breakdown of skin: Secondary | ICD-10-CM

## 2018-06-08 DIAGNOSIS — M79671 Pain in right foot: Secondary | ICD-10-CM | POA: Diagnosis not present

## 2018-06-08 DIAGNOSIS — L97511 Non-pressure chronic ulcer of other part of right foot limited to breakdown of skin: Secondary | ICD-10-CM | POA: Diagnosis not present

## 2018-06-12 ENCOUNTER — Other Ambulatory Visit: Payer: Self-pay

## 2018-06-12 ENCOUNTER — Inpatient Hospital Stay (HOSPITAL_COMMUNITY)
Admission: EM | Admit: 2018-06-12 | Discharge: 2018-06-17 | DRG: 617 | Disposition: A | Discharge status: Home Health | Payer: Medicare Other | Attending: Internal Medicine | Admitting: Internal Medicine

## 2018-06-12 ENCOUNTER — Encounter (HOSPITAL_COMMUNITY): Payer: Self-pay

## 2018-06-12 ENCOUNTER — Emergency Department (HOSPITAL_COMMUNITY): Payer: Medicare Other

## 2018-06-12 DIAGNOSIS — M19071 Primary osteoarthritis, right ankle and foot: Secondary | ICD-10-CM | POA: Diagnosis present

## 2018-06-12 DIAGNOSIS — L03115 Cellulitis of right lower limb: Secondary | ICD-10-CM | POA: Diagnosis present

## 2018-06-12 DIAGNOSIS — Z8673 Personal history of transient ischemic attack (TIA), and cerebral infarction without residual deficits: Secondary | ICD-10-CM | POA: Diagnosis not present

## 2018-06-12 DIAGNOSIS — E11621 Type 2 diabetes mellitus with foot ulcer: Secondary | ICD-10-CM | POA: Diagnosis present

## 2018-06-12 DIAGNOSIS — L97519 Non-pressure chronic ulcer of other part of right foot with unspecified severity: Secondary | ICD-10-CM | POA: Diagnosis present

## 2018-06-12 DIAGNOSIS — E11610 Type 2 diabetes mellitus with diabetic neuropathic arthropathy: Secondary | ICD-10-CM | POA: Diagnosis present

## 2018-06-12 DIAGNOSIS — B353 Tinea pedis: Secondary | ICD-10-CM | POA: Diagnosis not present

## 2018-06-12 DIAGNOSIS — E1140 Type 2 diabetes mellitus with diabetic neuropathy, unspecified: Secondary | ICD-10-CM | POA: Diagnosis present

## 2018-06-12 DIAGNOSIS — K5903 Drug induced constipation: Secondary | ICD-10-CM | POA: Diagnosis not present

## 2018-06-12 DIAGNOSIS — Z79899 Other long term (current) drug therapy: Secondary | ICD-10-CM

## 2018-06-12 DIAGNOSIS — E785 Hyperlipidemia, unspecified: Secondary | ICD-10-CM | POA: Diagnosis present

## 2018-06-12 DIAGNOSIS — Z96653 Presence of artificial knee joint, bilateral: Secondary | ICD-10-CM | POA: Diagnosis present

## 2018-06-12 DIAGNOSIS — L97529 Non-pressure chronic ulcer of other part of left foot with unspecified severity: Secondary | ICD-10-CM | POA: Diagnosis present

## 2018-06-12 DIAGNOSIS — Z8701 Personal history of pneumonia (recurrent): Secondary | ICD-10-CM | POA: Diagnosis not present

## 2018-06-12 DIAGNOSIS — Z902 Acquired absence of lung [part of]: Secondary | ICD-10-CM

## 2018-06-12 DIAGNOSIS — F17210 Nicotine dependence, cigarettes, uncomplicated: Secondary | ICD-10-CM | POA: Diagnosis present

## 2018-06-12 DIAGNOSIS — Z7984 Long term (current) use of oral hypoglycemic drugs: Secondary | ICD-10-CM

## 2018-06-12 DIAGNOSIS — E1165 Type 2 diabetes mellitus with hyperglycemia: Secondary | ICD-10-CM | POA: Diagnosis present

## 2018-06-12 DIAGNOSIS — Z96641 Presence of right artificial hip joint: Secondary | ICD-10-CM | POA: Diagnosis present

## 2018-06-12 DIAGNOSIS — T402X5A Adverse effect of other opioids, initial encounter: Secondary | ICD-10-CM | POA: Diagnosis not present

## 2018-06-12 DIAGNOSIS — M869 Osteomyelitis, unspecified: Secondary | ICD-10-CM | POA: Diagnosis present

## 2018-06-12 DIAGNOSIS — E1169 Type 2 diabetes mellitus with other specified complication: Principal | ICD-10-CM | POA: Diagnosis present

## 2018-06-12 DIAGNOSIS — G8929 Other chronic pain: Secondary | ICD-10-CM | POA: Diagnosis not present

## 2018-06-12 DIAGNOSIS — Z833 Family history of diabetes mellitus: Secondary | ICD-10-CM

## 2018-06-12 DIAGNOSIS — M86271 Subacute osteomyelitis, right ankle and foot: Secondary | ICD-10-CM | POA: Diagnosis not present

## 2018-06-12 DIAGNOSIS — Z1159 Encounter for screening for other viral diseases: Secondary | ICD-10-CM | POA: Diagnosis not present

## 2018-06-12 DIAGNOSIS — K219 Gastro-esophageal reflux disease without esophagitis: Secondary | ICD-10-CM | POA: Diagnosis present

## 2018-06-12 DIAGNOSIS — L0390 Cellulitis, unspecified: Secondary | ICD-10-CM | POA: Diagnosis present

## 2018-06-12 LAB — COMPREHENSIVE METABOLIC PANEL
ALT: 17 U/L (ref 0–44)
AST: 19 U/L (ref 15–41)
Albumin: 3.9 g/dL (ref 3.5–5.0)
Alkaline Phosphatase: 95 U/L (ref 38–126)
Anion gap: 13 (ref 5–15)
BUN: 13 mg/dL (ref 6–20)
CO2: 25 mmol/L (ref 22–32)
Calcium: 9.1 mg/dL (ref 8.9–10.3)
Chloride: 96 mmol/L — ABNORMAL LOW (ref 98–111)
Creatinine, Ser: 0.9 mg/dL (ref 0.61–1.24)
GFR calc Af Amer: >60 mL/min (ref >60)
GFR calc non Af Amer: >60 mL/min (ref >60)
Glucose, Bld: 174 mg/dL — ABNORMAL HIGH (ref 70–99)
Potassium: 3.8 mmol/L (ref 3.5–5.1)
Sodium: 134 mmol/L — ABNORMAL LOW (ref 135–145)
Total Bilirubin: 0.6 mg/dL (ref 0.3–1.2)
Total Protein: 7.2 g/dL (ref 6.5–8.1)

## 2018-06-12 LAB — GLUCOSE, CAPILLARY
Glucose-Capillary: 129 mg/dL — ABNORMAL HIGH (ref 70–99)
Glucose-Capillary: 146 mg/dL — ABNORMAL HIGH (ref 70–99)

## 2018-06-12 LAB — CBC WITH DIFFERENTIAL/PLATELET
Abs Immature Granulocytes: 0.02 10*3/uL (ref 0.00–0.07)
Basophils Absolute: 0.1 10*3/uL (ref 0.0–0.1)
Basophils Relative: 1 %
Eosinophils Absolute: 0.2 10*3/uL (ref 0.0–0.5)
Eosinophils Relative: 2 %
HCT: 41.5 % (ref 39.0–52.0)
Hemoglobin: 13.5 g/dL (ref 13.0–17.0)
Immature Granulocytes: 0 %
Lymphocytes Relative: 25 %
Lymphs Abs: 2.2 10*3/uL (ref 0.7–4.0)
MCH: 27.4 pg (ref 26.0–34.0)
MCHC: 32.5 g/dL (ref 30.0–36.0)
MCV: 84.2 fL (ref 80.0–100.0)
Monocytes Absolute: 0.8 10*3/uL (ref 0.1–1.0)
Monocytes Relative: 9 %
Neutro Abs: 5.6 10*3/uL (ref 1.7–7.7)
Neutrophils Relative %: 63 %
Platelets: 236 10*3/uL (ref 150–400)
RBC: 4.93 MIL/uL (ref 4.22–5.81)
RDW: 13.8 % (ref 11.5–15.5)
WBC: 8.9 10*3/uL (ref 4.0–10.5)
nRBC: 0 % (ref 0.0–0.2)

## 2018-06-12 LAB — SARS CORONAVIRUS 2 BY RT PCR (HOSPITAL ORDER, PERFORMED IN ~~LOC~~ HOSPITAL LAB): SARS Coronavirus 2: NEGATIVE

## 2018-06-12 MED ORDER — QUETIAPINE FUMARATE 50 MG PO TABS
100.0000 mg | ORAL_TABLET | Freq: Every day | ORAL | Status: DC
  Administered 2018-06-12 – 2018-06-16 (×5): 100 mg via ORAL
  Filled 2018-06-12 (×5): qty 2

## 2018-06-12 MED ORDER — ACETAMINOPHEN 325 MG PO TABS: 650.0000 mg | ORAL_TABLET | Freq: Four times a day (QID) | ORAL | Status: DC | PRN

## 2018-06-12 MED ORDER — CEFAZOLIN SODIUM-DEXTROSE 2-4 GM/100ML-% IV SOLN
2.0000 g | Freq: Three times a day (TID) | INTRAVENOUS | Status: DC
  Administered 2018-06-12 – 2018-06-13 (×3): 2 g via INTRAVENOUS
  Filled 2018-06-12 (×6): qty 100

## 2018-06-12 MED ORDER — INSULIN ASPART 100 UNIT/ML ~~LOC~~ SOLN
0.0000 [IU] | Freq: Three times a day (TID) | SUBCUTANEOUS | Status: DC
  Administered 2018-06-12 – 2018-06-13 (×2): 1 [IU] via SUBCUTANEOUS
  Administered 2018-06-13: 2 [IU] via SUBCUTANEOUS
  Administered 2018-06-14 (×2): 1 [IU] via SUBCUTANEOUS
  Administered 2018-06-15 – 2018-06-16 (×4): 3 [IU] via SUBCUTANEOUS

## 2018-06-12 MED ORDER — VITAMIN C 500 MG PO TABS
500.0000 mg | ORAL_TABLET | Freq: Every day | ORAL | Status: DC
  Administered 2018-06-12 – 2018-06-17 (×6): 500 mg via ORAL
  Filled 2018-06-12 (×7): qty 1

## 2018-06-12 MED ORDER — HYDROMORPHONE HCL 1 MG/ML IJ SOLN
0.5000 mg | Freq: Once | INTRAMUSCULAR | Status: AC
  Administered 2018-06-12: 0.5 mg via INTRAVENOUS
  Filled 2018-06-12: qty 1

## 2018-06-12 MED ORDER — CEFAZOLIN SODIUM-DEXTROSE 1-4 GM/50ML-% IV SOLN
1.0000 g | Freq: Once | INTRAVENOUS | Status: AC
  Administered 2018-06-12: 1 g via INTRAVENOUS
  Filled 2018-06-12: qty 50

## 2018-06-12 MED ORDER — INSULIN ASPART 100 UNIT/ML ~~LOC~~ SOLN
0.0000 [IU] | Freq: Every day | SUBCUTANEOUS | Status: DC
  Administered 2018-06-15: 21:00:00 3 [IU] via SUBCUTANEOUS

## 2018-06-12 MED ORDER — ONDANSETRON HCL 4 MG/2ML IJ SOLN
4.0000 mg | Freq: Once | INTRAMUSCULAR | Status: AC
  Administered 2018-06-12: 4 mg via INTRAVENOUS
  Filled 2018-06-12: qty 2

## 2018-06-12 MED ORDER — NICOTINE 21 MG/24HR TD PT24
21.0000 mg | MEDICATED_PATCH | Freq: Once | TRANSDERMAL | Status: AC
  Administered 2018-06-12: 13:00:00 21 mg via TRANSDERMAL

## 2018-06-12 MED ORDER — ONDANSETRON HCL 4 MG/2ML IJ SOLN
4.0000 mg | Freq: Four times a day (QID) | INTRAMUSCULAR | Status: DC | PRN
  Administered 2018-06-15: 11:00:00 4 mg via INTRAVENOUS

## 2018-06-12 MED ORDER — NICOTINE 21 MG/24HR TD PT24
MEDICATED_PATCH | TRANSDERMAL | Status: AC
  Filled 2018-06-12: qty 1

## 2018-06-12 MED ORDER — ROSUVASTATIN CALCIUM 20 MG PO TABS
20.0000 mg | ORAL_TABLET | Freq: Every day | ORAL | Status: DC
  Administered 2018-06-14 – 2018-06-17 (×3): 20 mg via ORAL
  Filled 2018-06-12 (×5): qty 1

## 2018-06-12 MED ORDER — ONDANSETRON HCL 4 MG PO TABS: 4.0000 mg | ORAL_TABLET | Freq: Four times a day (QID) | ORAL | Status: DC | PRN

## 2018-06-12 MED ORDER — ACETAMINOPHEN 650 MG RE SUPP: 650.0000 mg | Freq: Four times a day (QID) | RECTAL | Status: DC | PRN

## 2018-06-12 MED ORDER — SODIUM CHLORIDE 0.9 % IV SOLN
INTRAVENOUS | Status: DC
  Administered 2018-06-12 – 2018-06-13 (×3): via INTRAVENOUS

## 2018-06-12 MED ORDER — ENOXAPARIN SODIUM 40 MG/0.4ML ~~LOC~~ SOLN
40.0000 mg | SUBCUTANEOUS | Status: DC
  Administered 2018-06-12 – 2018-06-16 (×5): 40 mg via SUBCUTANEOUS
  Filled 2018-06-12 (×5): qty 0.4

## 2018-06-12 MED ORDER — OXYCODONE-ACETAMINOPHEN 5-325 MG PO TABS
1.0000 | ORAL_TABLET | Freq: Four times a day (QID) | ORAL | Status: DC | PRN
  Administered 2018-06-12 – 2018-06-14 (×7): 1 via ORAL
  Filled 2018-06-12 (×7): qty 1

## 2018-06-12 MED ORDER — INSULIN ASPART 100 UNIT/ML ~~LOC~~ SOLN
3.0000 [IU] | Freq: Three times a day (TID) | SUBCUTANEOUS | Status: DC
  Administered 2018-06-12 – 2018-06-17 (×9): 3 [IU] via SUBCUTANEOUS

## 2018-06-12 MED ORDER — HYDROMORPHONE HCL 1 MG/ML IJ SOLN
1.0000 mg | INTRAMUSCULAR | Status: DC | PRN
  Administered 2018-06-12 – 2018-06-15 (×14): 1 mg via INTRAVENOUS
  Filled 2018-06-12 (×15): qty 1

## 2018-06-12 MED ORDER — VITAMIN D 25 MCG (1000 UNIT) PO TABS
10000.0000 [IU] | ORAL_TABLET | Freq: Every day | ORAL | Status: DC
  Administered 2018-06-12 – 2018-06-17 (×5): 10000 [IU] via ORAL
  Filled 2018-06-12 (×6): qty 10

## 2018-06-12 NOTE — Progress Notes (Signed)
Pt admitted from [facility] via [place] to [phone].  Pt has reddened area on the right lower extremity which extends up the right leg 1/2 way.  1+ dorsalis pedal pulse present on the right.  Area present on admission:  1.0x1.0 cm open area surrounded by 2.0x2.0 cm total circlular area which is whitish and hard.  No real drainage present.  Cleansed with saline and applied xeroform gauze and covered with foam, wrapped with kerlex and yellow socks applied.   Left foot:  Area of wound 1.5x1.5x0.5 cm with circular total area 3.5x3.5 cm whitish circular hardened area, cleansed with saline, applied xeroform gauze and covered with foam dressing and wrapped with kerlix and yellow sock.

## 2018-06-12 NOTE — ED Provider Notes (Addendum)
[facility] EMERGENCY DEPARTMENT Provider Note   CSN: [id number] Arrival date & time: 06/12/18  1050    History   Chief Complaint Chief Complaint  Patient presents with   Wound Infection    HPI Burleigh Brockmann is a 53 y.o. male.     Patient presenting with a longstanding ulcer to the bottom of both feet.  The right foot has gotten redness on the top.  And is extending into the lower part of his leg.  This redness has developed over the last couple days.  Patient states that Dr. Jess Barters office has had him on doxycycline now for the past 2 weeks.  Patient states there is been clear drainage no purulent drainage.  Patient denies any fever or upper respiratory symptoms.  Patient has a history of diabetes.  Chart review shows that patient's been on doxycycline since May 6.  Patient states that all the redness just started 2 days ago.  The ulcer is been present long-term.     Past Medical History:  Diagnosis Date   Anxiety    Arthritis    "everywhere" (04/21/2012)   Cellulitis and abscess of foot 12/19/2013   LEFT FOOT   Chronic pain    DDD (degenerative disc disease)    Depression    Diabetes mellitus without complication ([place])    borderline   Diabetic foot ulcer ([place])    ETOH abuse    Full dentures    GERD (gastroesophageal reflux disease)    tums   History of blood transfusion    "related to left knee OR; probably right hip too" (04/21/2012)   Mental disorder    Neuromuscular disorder ([place])    neuropathy   Noncompliance    Open wound    bottom of foot   Osteomyelitis ([place])    right 4th toe   Pneumonia ~ 2012   Polysubstance abuse ([place])    etoh, cocaine, heroin   Stroke [facility]) 2008   "they said I might have had one during right hip replacement" (04/21/2012)   Wears glasses     Patient Active Problem List   Diagnosis Date Noted   H/O amputation of lesser toe, right ([place]) 07/29/2017   Diabetic foot infection ([place]) 06/22/2017   Post op infection  06/22/2017   SVT (supraventricular tachycardia) ([place]) 08/17/2016   Tobacco use disorder 10/25/2014   Diabetes mellitus type 2 in nonobese ([place])    Swelling    Type 2 diabetes mellitus with left diabetic foot ulcer ([place]) 12/18/2013   Diabetes mellitus due to underlying condition with foot ulcer ([place]) 12/18/2013   Left leg cellulitis 12/18/2013   Cocaine abuse ([place]) 03/29/2012   Generalized anxiety disorder 03/29/2012   Panic attacks 03/29/2012   Alcohol dependence ([place]) 08/05/2011    Class: Chronic   Substance abuse ([place]) 05/31/2011   Ulcer of left foot, limited to breakdown of skin ([place]) 02/15/2011   Neuropathic ulcer of foot ([place]) 02/15/2011    Past Surgical History:  Procedure Laterality Date   AMPUTATION Right 06/09/2017   Procedure: RIGHT 2ND TOE AMPUTATION;  Surgeon: [name], MD;  Location: [place];  Service: Orthopedics;  Laterality: Right;   AMPUTATION Right 07/23/2017   Procedure: RIGHT 4TH TOE AMPUTATION;  Surgeon: [name], MD;  Location: [place];  Service: Orthopedics;  Laterality: Right;   JOINT REPLACEMENT     KNEE ARTHROSCOPY Bilateral 1980's/1990's   LUNG LOBECTOMY Left ~ 2006   LUNG LOBECTOMY     METATARSAL OSTEOTOMY  10/29/2011   Procedure: METATARSAL OSTEOTOMY;  Surgeon: [name], MD;  Location: [place];  Service: Orthopedics;  Laterality: Left;  Left 1st Metatarsal Dorsal Closing Wedge    METATARSAL OSTEOTOMY Right 04/28/2017   Procedure: RIGHT 1ST METATARSAL DORSAL CLOSING WEDGE OSTEOTOMY;  Surgeon: [name], MD;  Location: [place];  Service: Orthopedics;  Laterality: Right;   MULTIPLE TOOTH EXTRACTIONS     REVISION TOTAL HIP ARTHROPLASTY Right 2008   "4-5 months after replacement" (04/21/2012)   TOTAL HIP ARTHROPLASTY Right 2008   TOTAL KNEE ARTHROPLASTY Left 2006   TOTAL KNEE ARTHROPLASTY Right 04/20/2012   TOTAL KNEE ARTHROPLASTY Right 04/20/2012   Procedure: TOTAL KNEE ARTHROPLASTY;  Surgeon: [name], MD;   Location: [place];  Service: Orthopedics;  Laterality: Right;  Right Total Knee Arthroplasty        Home Medications    Prior to Admission medications   Medication Sig Start Date End Date Taking? Authorizing Provider  acetaminophen (TYLENOL) 325 MG tablet Take 2 tablets (650 mg total) by mouth every 6 (six) hours as needed for mild pain (or Fever >/= 101). 06/10/17   Georgette Shell, MD  blood glucose meter kit and supplies KIT Dispense based on patient and insurance preference. Use up to four times daily as directed. (FOR ICD-9 250.00, 250.01). 08/18/16   Orvan Falconer, MD  Cholecalciferol (VITAMIN D3) 5000 units CAPS Take 10,000 Units by mouth daily.    [provider]  dapagliflozin propanediol (FARXIGA) 10 MG TABS tablet Take 10 mg by mouth daily.    [provider]  diltiazem (CARDIZEM) 30 MG tablet Take 1 tablet (30 mg total) by mouth 2 (two) times daily. 09/10/16   Lendon Colonel, NP  doxycycline (VIBRA-TABS) 100 MG tablet Take 1 tablet (100 mg total) by mouth every 12 (twelve) hours. 06/28/17   Donne Hazel, MD  doxycycline (VIBRA-TABS) 100 MG tablet Take 1 tablet (100 mg total) by mouth 2 (two) times daily. 05/25/18   Suzan Slick, NP  hydrOXYzine (ATARAX/VISTARIL) 10 MG tablet Take 1 tablet (10 mg total) by mouth every 6 (six) hours as needed for anxiety. 06/28/17   Donne Hazel, MD  metFORMIN (GLUCOPHAGE) 500 MG tablet Take 1 tablet (500 mg total) by mouth 2 (two) times daily with a meal. Patient taking differently: Take 1,000 mg by mouth 2 (two) times daily with a meal.  01/15/14   Philemon Kingdom, MD  oxyCODONE-acetaminophen (PERCOCET/ROXICET) 5-325 MG tablet Take 1 tablet by mouth every 8 (eight) hours as needed. 08/04/17   Suzan Slick, NP  oxyCODONE-acetaminophen (PERCOCET/ROXICET) 5-325 MG tablet Take 1 tablet by mouth every 8 (eight) hours as needed. 08/19/17   [name], MD  QUEtiapine (SEROQUEL) 100 MG tablet Take 100 mg by mouth at bedtime.     [provider]  rosuvastatin (CRESTOR) 20 MG tablet Take 20 mg by mouth daily. 01/20/17   [provider]  saccharomyces boulardii (FLORASTOR) 250 MG capsule Take 1 capsule (250 mg total) by mouth 2 (two) times daily. 06/28/17   Donne Hazel, MD  silver sulfADIAZINE (SILVADENE) 1 % cream Apply 1 application topically daily. 05/25/18   Suzan Slick, NP  sulfamethoxazole-trimethoprim (BACTRIM DS,SEPTRA DS) 800-160 MG tablet Take 1 tablet by mouth 2 (two) times daily. 07/07/17   Suzan Slick, NP  vitamin C (ASCORBIC ACID) 500 MG tablet Take 500 mg by mouth daily.    [provider]    Family History  Family History  Problem Relation Age of Onset   Diabetes Father     Social History Social History   Tobacco Use   Smoking status: Current Every Day Smoker    Packs/day: 1.00    Years: 30.00    Pack years: 30.00    Types: Cigarettes   Smokeless tobacco: Never Used  Substance Use Topics   Alcohol use: Not Currently    Alcohol/week: 0.0 standard drinks    Frequency: Never   Drug use: Not Currently    Types: Cocaine    Comment: Cocaine and heroin - 10 years ago     Allergies   Benadryl [diphenhydramine hcl] and Trazodone and nefazodone   Review of Systems Review of Systems  Constitutional: Negative for chills and fever.  HENT: Negative for congestion, rhinorrhea and sore throat.   Eyes: Negative for redness and visual disturbance.  Respiratory: Negative for cough and shortness of breath.   Cardiovascular: Negative for chest pain and leg swelling.  Gastrointestinal: Negative for abdominal distention, abdominal pain, diarrhea, nausea and vomiting.  Genitourinary: Negative for dysuria.  Musculoskeletal: Negative for back pain and neck pain.  Skin: Positive for wound. Negative for rash.  Neurological: Negative for dizziness, light-headedness and headaches.  Hematological: Does not bruise/bleed easily.  Psychiatric/Behavioral: Negative for  confusion.     Physical Exam Updated Vital Signs BP 122/82 (BP Location: Right Arm)    Pulse (!) 117    Temp 99 F (37.2 C) (Oral)    Resp 20    Ht 1.905 m ('6\' 3"'$ )    Wt 83.9 kg    SpO2 98%    BMI 23.12 kg/m   Physical Exam Vitals signs and nursing note reviewed.  Constitutional:      Appearance: Normal appearance. He is well-developed.  HENT:     Head: Normocephalic and atraumatic.  Eyes:     Extraocular Movements: Extraocular movements intact.     Conjunctiva/sclera: Conjunctivae normal.     Pupils: Pupils are equal, round, and reactive to light.  Neck:     Musculoskeletal: Neck supple.  Cardiovascular:     Rate and Rhythm: Normal rate and regular rhythm.     Heart sounds: No murmur.  Pulmonary:     Effort: Pulmonary effort is normal. No respiratory distress.     Breath sounds: Normal breath sounds.  Abdominal:     General: Bowel sounds are normal.     Palpations: Abdomen is soft.     Tenderness: There is no abdominal tenderness.  Musculoskeletal: Normal range of motion.        General: Swelling and tenderness present.     Comments: Ulcers to the bilateral part of balls of both feet.  The right foot without any drainage.  But a lot of erythema on the dorsal aspect of the foot.  Extending up into the lower one third of the right leg.  Good cap refill to both big toes.  Patient missing from prior amputations toes on the right foot.  Tenderness to palpation of the top of the forefoot.  Skin:    General: Skin is warm and dry.     Capillary Refill: Capillary refill takes less than 2 seconds.  Neurological:     General: No focal deficit present.     Mental Status: He is alert and oriented to person, place, and time.      ED Treatments / Results  Labs (all labs ordered are listed, but only abnormal results are displayed) Labs Reviewed  COMPREHENSIVE METABOLIC PANEL - Abnormal; Notable for the following components:      Result Value   Sodium 134 (*)    Chloride 96 (*)      Glucose, Bld 174 (*)    All other components within normal limits  SARS CORONAVIRUS 2 (HOSPITAL ORDER, [place] [facility])  CBC WITH DIFFERENTIAL/PLATELET    EKG None  Radiology Dg Foot Complete Right  Result Date: 06/12/2018 CLINICAL DATA:  Diabetic with foot ulceration EXAM: RIGHT FOOT COMPLETE - 3+ VIEW COMPARISON:  [facility] June 22, 2017 and right foot MR June 22, 2017 FINDINGS: Frontal, oblique, and lateral views obtained. There is postoperative change at the right first tarsal-metatarsal joint with remodeling in the distal aspect of the medial cuneiform bone. This lucency in this area raises concern for bony destruction. There has been amputation of the second and fourth digits at the respective MTP joint levels. There is evidence of an old fracture of the distal aspect of the third metatarsal with remodeling. There is no acute fracture or dislocation. There is osteoarthritic change in the first MTP joint. No erosive changes noted. IMPRESSION: 1. Postoperative change in the first tarsal-metatarsal joint. There is apparent bony destruction involving the distal aspect of the medial cuneiform bone. There is ill-defined sequestrum between the proximal first and second metatarsals which may be reflective of the osteomyelitis in this area. 2. Previous amputations of the second and fourth digits at the MTP joint levels. Soft tissue in these areas appears normal. 3.  Prior fracture of the distal third metatarsal with remodeling. 4.  Moderate arthropathy in the first MTP joint. Electronically Signed   By: Lowella Grip III M.D.   On: 06/12/2018 12:23    Procedures Procedures (including critical care time)  Medications Ordered in ED Medications  0.9 %  sodium chloride infusion ( Intravenous New Bag/Given 06/12/18 1224)  nicotine (NICODERM CQ - dosed in mg/24 hours) patch 21 mg (has no administration in time range)  nicotine (NICODERM CQ - dosed in mg/24 hours) 21 mg/24hr patch  (has no administration in time range)  ceFAZolin (ANCEF) IVPB 1 g/50 mL premix (1 g Intravenous New Bag/Given 06/12/18 1228)  ondansetron (ZOFRAN) injection 4 mg (4 mg Intravenous Given 06/12/18 1226)  HYDROmorphone (DILAUDID) injection 0.5 mg (0.5 mg Intravenous Given 06/12/18 1225)     Initial Impression / Assessment and Plan / ED Course  I have reviewed the triage vital signs and the nursing notes.  Pertinent labs & imaging results that were available during my care of the patient were reviewed by me and considered in my medical decision making (see chart for details).       Hospitalist spoke to orthopedic doctor and stated that they would see him in the office.  Dr. Jess Barters office and they put him on doxycycline but patient has been on doxycycline by Dr. Jess Barters office [place] for 2 weeks now.  Does have extension of the cellulitis.  On oral antibiotics this would be consistent with an outpatient failure.  We will recontact the hospitalist regarding admission.  To make sure that they understand that part of his history.  Still feel patient would benefit for some IV antibiotics at least overnight.  X-rays are raising concerns for some osteomyelitis.  As well as the forefoot cellulitis with extension into the lower part of the leg.   Final Clinical Impressions(s) / ED Diagnoses   Final diagnoses:  Cellulitis of right foot  Osteomyelitis of right foot, unspecified type (  [facility])    ED Discharge Orders    None       Fredia Sorrow, MD 06/12/18 1431  Addendum spoke with Dr. Wynetta Fines the hospitalist.  Based on the fact that he has been on doxycycline for 2 weeks.  He recontacted Dr. Sharol Given from orthopedics.  Patient will be admitted to [facility] where orthopedics cannot consult on him.  And will he will receive IV antibiotics.  Patient is aware of the plan for transfer and admission to [facility] for the cellulitis and osteomyelitis and IV antibiotics.    Fredia Sorrow, MD 06/12/18 1452

## 2018-06-12 NOTE — H&P (Signed)
History and Physical    [name] HEN:[id number] DOB: December 05, 1965 DOA: 06/12/2018  PCP: Lucia Gaskins, MD   Patient coming from: Home  Chief Complaint: R lower extremity erythema/swelling  HPI: [name] is a 53 y.o. male with medical history significant fordiabetes and diabetic foot ulcers, diabetic neuropathy, chronic pain, and prior fourth toe amputation of right foot who presented with worsening erythema and warmth of his right foot and leg over the last 2 days.  He denies any fevers or chills or other symptomatology.  No drainage noted from foot ulcers.  He was noted to have recent treatment on oral doxycycline for 2 weeks that he has completed and appears to have failed outpatient treatment. He has minimal edema noted over the dorsum of his right foot as well.  He has last seen his orthopedist Dr. Sharol Given approximately 1 week ago he did not have any issues at that time.   ED Course: He has been started on some IV fluid as well as Ancef and given some Dilaudid for pain control.  Right foot x-ray does appear to demonstrate some osteomyelitis.  Laboratory data otherwise unremarkable with no signs of sepsis noted.  Discussed case with Dr. Sharol Given and initially felt that he could be discharged until information about his recent oral antibiotic use came to light.  He now recommends IV antibiotics and admission to [facility] where he will see patient in a.m. for further evaluation and management.  Review of Systems: All others negative aside from HPI.  Past Medical History:  Diagnosis Date   Anxiety    Arthritis    "everywhere" (04/21/2012)   Cellulitis and abscess of foot 12/19/2013   LEFT FOOT   Chronic pain    DDD (degenerative disc disease)    Depression    Diabetes mellitus without complication ([place])    borderline   Diabetic foot ulcer ([place])    ETOH abuse    Full dentures    GERD (gastroesophageal reflux disease)    tums   History of blood transfusion    "related to  left knee OR; probably right hip too" (04/21/2012)   Mental disorder    Neuromuscular disorder ([place])    neuropathy   Noncompliance    Open wound    bottom of foot   Osteomyelitis ([place])    right 4th toe   Pneumonia ~ 2012   Polysubstance abuse ([place])    etoh, cocaine, heroin   Stroke [facility] 2008   "they said I might have had one during right hip replacement" (04/21/2012)   Wears glasses     Past Surgical History:  Procedure Laterality Date   AMPUTATION Right 06/09/2017   Procedure: RIGHT 2ND TOE AMPUTATION;  Surgeon: [name], MD;  Location: [place];  Service: Orthopedics;  Laterality: Right;   AMPUTATION Right 07/23/2017   Procedure: RIGHT 4TH TOE AMPUTATION;  Surgeon: [name], MD;  Location: [place];  Service: Orthopedics;  Laterality: Right;   JOINT REPLACEMENT     KNEE ARTHROSCOPY Bilateral 1980's/1990's   LUNG LOBECTOMY Left ~ 2006   LUNG LOBECTOMY     METATARSAL OSTEOTOMY  10/29/2011   Procedure: METATARSAL OSTEOTOMY;  Surgeon: [name], MD;  Location: [place];  Service: Orthopedics;  Laterality: Left;  Left 1st Metatarsal Dorsal Closing Wedge    METATARSAL OSTEOTOMY Right 04/28/2017   Procedure: RIGHT 1ST METATARSAL DORSAL CLOSING WEDGE OSTEOTOMY;  Surgeon: [name], MD;  Location: [place];  Service: Orthopedics;  Laterality: Right;  MULTIPLE TOOTH EXTRACTIONS     REVISION TOTAL HIP ARTHROPLASTY Right 2008   "4-5 months after replacement" (04/21/2012)   TOTAL HIP ARTHROPLASTY Right 2008   TOTAL KNEE ARTHROPLASTY Left 2006   TOTAL KNEE ARTHROPLASTY Right 04/20/2012   TOTAL KNEE ARTHROPLASTY Right 04/20/2012   Procedure: TOTAL KNEE ARTHROPLASTY;  Surgeon: [name], MD;  Location: [place];  Service: Orthopedics;  Laterality: Right;  Right Total Knee Arthroplasty     reports that he has been smoking cigarettes. He has a 30.00 pack-year smoking history. He has never used smokeless tobacco. He reports previous alcohol use. He reports previous drug  use. Drug: Cocaine.  Allergies  Allergen Reactions   Benadryl [Diphenhydramine Hcl] Other (See Comments)    Leg spasms    Trazodone And Nefazodone Other (See Comments)    Leg spasms     Family History  Problem Relation Age of Onset   Diabetes Father     Prior to Admission medications   Medication Sig Start Date End Date Taking? Authorizing Provider  Cholecalciferol (VITAMIN D3) 5000 units CAPS Take 10,000 Units by mouth daily.   Yes [provider]  doxycycline (VIBRA-TABS) 100 MG tablet Take 1 tablet (100 mg total) by mouth every 12 (twelve) hours. 06/28/17  Yes Donne Hazel, MD  metFORMIN (GLUCOPHAGE) 500 MG tablet Take 1 tablet (500 mg total) by mouth 2 (two) times daily with a meal. Patient taking differently: Take 1,000 mg by mouth 2 (two) times daily with a meal.  01/15/14  Yes Philemon Kingdom, MD  silver sulfADIAZINE (SILVADENE) 1 % cream Apply 1 application topically daily. 05/25/18  Yes Dondra Prader R, NP  vitamin C (ASCORBIC ACID) 500 MG tablet Take 500 mg by mouth daily.   Yes [provider]  acetaminophen (TYLENOL) 325 MG tablet Take 2 tablets (650 mg total) by mouth every 6 (six) hours as needed for mild pain (or Fever >/= 101). Patient not taking: Reported on 06/12/2018 06/10/17   Georgette Shell, MD  blood glucose meter kit and supplies KIT Dispense based on patient and insurance preference. Use up to four times daily as directed. (FOR ICD-9 250.00, 250.01). Patient not taking: Reported on 06/12/2018 08/18/16   Orvan Falconer, MD  dapagliflozin propanediol (FARXIGA) 10 MG TABS tablet Take 10 mg by mouth daily.    [provider]  diltiazem (CARDIZEM) 30 MG tablet Take 1 tablet (30 mg total) by mouth 2 (two) times daily. Patient not taking: Reported on 06/12/2018 09/10/16   Lendon Colonel, NP  doxycycline (VIBRA-TABS) 100 MG tablet Take 1 tablet (100 mg total) by mouth 2 (two) times daily. Patient not taking: Reported on 06/12/2018 05/25/18    Suzan Slick, NP  hydrOXYzine (ATARAX/VISTARIL) 10 MG tablet Take 1 tablet (10 mg total) by mouth every 6 (six) hours as needed for anxiety. Patient not taking: Reported on 06/12/2018 06/28/17   Donne Hazel, MD  oxyCODONE-acetaminophen (PERCOCET/ROXICET) 5-325 MG tablet Take 1 tablet by mouth every 8 (eight) hours as needed. Patient not taking: Reported on 06/12/2018 08/04/17   Suzan Slick, NP  oxyCODONE-acetaminophen (PERCOCET/ROXICET) 5-325 MG tablet Take 1 tablet by mouth every 8 (eight) hours as needed. Patient not taking: Reported on 06/12/2018 08/19/17   [name], MD  QUEtiapine (SEROQUEL) 100 MG tablet Take 100 mg by mouth at bedtime.    [provider]  rosuvastatin (CRESTOR) 20 MG tablet Take 20 mg by mouth daily. 01/20/17   [provider]  saccharomyces boulardii (FLORASTOR) 250 MG capsule Take 1 capsule (250 mg total) by mouth 2 (two) times daily. Patient not taking: Reported on 06/12/2018 06/28/17   Donne Hazel, MD  sulfamethoxazole-trimethoprim (BACTRIM DS,SEPTRA DS) 800-160 MG tablet Take 1 tablet by mouth 2 (two) times daily. Patient not taking: Reported on 06/12/2018 07/07/17   Suzan Slick, NP    Physical Exam: Vitals:   06/12/18 1106 06/12/18 1109 06/12/18 1337  BP:  122/82 113/76  Pulse:  (!) 117 91  Resp:  20 18  Temp:  99 F (37.2 C)   TempSrc:  Oral   SpO2:  98% 100%  Weight: 83.9 kg    Height: _0  (1.905 m)      Constitutional: NAD, calm, comfortable Vitals:   06/12/18 1106 06/12/18 1109 06/12/18 1337  BP:  122/82 113/76  Pulse:  (!) 117 91  Resp:  20 18  Temp:  99 F (37.2 C)   TempSrc:  Oral   SpO2:  98% 100%  Weight: 83.9 kg    Height: _1  (1.905 m)     Eyes: lids and conjunctivae normal ENMT: Mucous membranes are moist.  Neck: normal, supple Respiratory: clear to auscultation bilaterally. Normal respiratory effort. No accessory muscle use.  Cardiovascular: Regular rate and rhythm, no murmurs. No extremity  edema. Abdomen: no tenderness, no distention. Bowel sounds positive.  Musculoskeletal:  No joint deformity upper and lower extremities.   Skin: no rashes, lesions, ulcers.  Right lower extremity with erythema and warmth noted.  Mild swelling over dorsum aspect of the right foot.  Amputation noted.  Ulcers noted to plantar aspects of both feet.  No active drainage. Psychiatric: Normal judgment and insight. Alert and oriented x 3. Normal mood.   Labs on Admission: I have personally reviewed following labs and imaging studies  CBC: Recent Labs  Lab 06/12/18 1220  WBC 8.9  NEUTROABS 5.6  HGB 13.5  HCT 41.5  MCV 84.2  PLT 680   Basic Metabolic Panel: Recent Labs  Lab 06/12/18 1220  NA 134*  K 3.8  CL 96*  CO2 25  GLUCOSE 174*  BUN 13  CREATININE 0.90  CALCIUM 9.1   GFR: Estimated Creatinine Clearance: 110.1 mL/min (by C-G formula based on SCr of 0.9 mg/dL). Liver Function Tests: Recent Labs  Lab 06/12/18 1220  AST 19  ALT 17  ALKPHOS 95  BILITOT 0.6  PROT 7.2  ALBUMIN 3.9   No results for input(s): LIPASE, AMYLASE in the last 168 hours. No results for input(s): AMMONIA in the last 168 hours. Coagulation Profile: No results for input(s): INR, PROTIME in the last 168 hours. Cardiac Enzymes: No results for input(s): CKTOTAL, CKMB, CKMBINDEX, TROPONINI in the last 168 hours. BNP (last 3 results) No results for input(s): PROBNP in the last 8760 hours. HbA1C: No results for input(s): HGBA1C in the last 72 hours. CBG: No results for input(s): GLUCAP in the last 168 hours. Lipid Profile: No results for input(s): CHOL, HDL, LDLCALC, TRIG, CHOLHDL, LDLDIRECT in the last 72 hours. Thyroid Function Tests: No results for input(s): TSH, T4TOTAL, FREET4, T3FREE, THYROIDAB in the last 72 hours. Anemia Panel: No results for input(s): VITAMINB12, FOLATE, FERRITIN, TIBC, IRON, RETICCTPCT in the last 72 hours. Urine analysis:    Component Value Date/Time   COLORURINE STRAW  (A) 09/04/2016 0741   APPEARANCEUR CLEAR 09/04/2016 0741   LABSPEC 1.001 (L) 09/04/2016 0741   PHURINE 6.0 09/04/2016 0741   GLUCOSEU >=500 (A) 09/04/2016 8811  HGBUR NEGATIVE 09/04/2016 0741   BILIRUBINUR NEGATIVE 09/04/2016 0741   KETONESUR 5 (A) 09/04/2016 0741   PROTEINUR NEGATIVE 09/04/2016 0741   UROBILINOGEN 0.2 12/18/2013 1402   NITRITE NEGATIVE 09/04/2016 0741   LEUKOCYTESUR NEGATIVE 09/04/2016 0741    Radiological Exams on Admission: Dg Foot Complete Right  Result Date: 06/12/2018 CLINICAL DATA:  Diabetic with foot ulceration EXAM: RIGHT FOOT COMPLETE - 3+ VIEW COMPARISON:  [facility] June 22, 2017 and right foot MR June 22, 2017 FINDINGS: Frontal, oblique, and lateral views obtained. There is postoperative change at the right first tarsal-metatarsal joint with remodeling in the distal aspect of the medial cuneiform bone. This lucency in this area raises concern for bony destruction. There has been amputation of the second and fourth digits at the respective MTP joint levels. There is evidence of an old fracture of the distal aspect of the third metatarsal with remodeling. There is no acute fracture or dislocation. There is osteoarthritic change in the first MTP joint. No erosive changes noted. IMPRESSION: 1. Postoperative change in the first tarsal-metatarsal joint. There is apparent bony destruction involving the distal aspect of the medial cuneiform bone. There is ill-defined sequestrum between the proximal first and second metatarsals which may be reflective of the osteomyelitis in this area. 2. Previous amputations of the second and fourth digits at the MTP joint levels. Soft tissue in these areas appears normal. 3.  Prior fracture of the distal third metatarsal with remodeling. 4.  Moderate arthropathy in the first MTP joint. Electronically Signed   By: Lowella Grip III M.D.   On: 06/12/2018 12:23    Assessment/Plan Active Problems:   Cellulitis   Right lower extremity  cellulitis with signs of osteomyelitis and diabetic patient -Discussed case with Dr. Sharol Given of orthopedics who is familiar with the patient and will follow in a.m. -Continue on IV Ancef and gentle IV fluid -MRI of right foot for further evaluation of osteomyelitis as recommended by orthopedic surgery -Follow a.m. labs  Type 2 diabetes -Maintain on SSI -Hold home medications  Chronic pain -Maintain on home medications with IV pain medication for breakthrough  Dyslipidemia -Maintain on statin   DVT prophylaxis: Lovenox Code Status: Full Family Communication: None at bedside Disposition Plan:Transfer to [facility] for Ortho evaluation by Dr. Sharol Given; IV abx Consults called:Dr. Sharol Given spoken to regarding consultation on 5/25 Admission status: Inpatient, MedSurg   Raynette Arras Darleen Crocker DO [facility] Pager [phone]  If 7PM-7AM, please contact night-coverage [email or web address] Password [email or web address]  06/12/2018, 2:47 PM

## 2018-06-12 NOTE — ED Notes (Signed)
[facility] called for transport to [facility]

## 2018-06-12 NOTE — Plan of Care (Signed)
  Problem: Education: Goal: Knowledge of General Education information will improve Description Including pain rating scale, medication(s)/side effects and non-pharmacologic comfort measures 06/12/2018 2051 by Heloise Purpura, RN Outcome: Progressing 06/12/2018 2051 by Heloise Purpura, RN Outcome: Progressing   Problem: Health Behavior/Discharge Planning: Goal: Ability to manage health-related needs will improve 06/12/2018 2051 by Heloise Purpura, RN Outcome: Progressing 06/12/2018 2051 by Heloise Purpura, RN Outcome: Progressing   Problem: Clinical Measurements: Goal: Ability to maintain clinical measurements within normal limits will improve Outcome: Progressing Goal: Will remain free from infection Outcome: Progressing Goal: Diagnostic test results will improve Outcome: Progressing Goal: Respiratory complications will improve Outcome: Progressing Goal: Cardiovascular complication will be avoided Outcome: Progressing   Problem: Activity: Goal: Risk for activity intolerance will decrease Outcome: Progressing   Problem: Nutrition: Goal: Adequate nutrition will be maintained Outcome: Progressing   Problem: Coping: Goal: Level of anxiety will decrease Outcome: Progressing   Problem: Elimination: Goal: Will not experience complications related to bowel motility Outcome: Progressing Goal: Will not experience complications related to urinary retention Outcome: Progressing   Problem: Pain Managment: Goal: General experience of comfort will improve Outcome: Progressing   Problem: Safety: Goal: Ability to remain free from injury will improve Outcome: Progressing   Problem: Skin Integrity: Goal: Risk for impaired skin integrity will decrease Outcome: Progressing

## 2018-06-12 NOTE — Consult Note (Signed)
Medical Consultation   [name]  LEX:[id number]  DOB: Mar 03, 1965  DOA: 06/12/2018  PCP: Javier Gaskins, MD   Outpatient Specialists: Dr. Sharol Given, orthopedics   Requesting physician: Dr. Rogene Houston  Reason for consultation: Admission for cellulitis   History of Present Illness: [name] is an 53 y.o. male with history of diabetes and diabetic foot ulcers, diabetic neuropathy, chronic pain, and prior fourth toe amputation of right foot who presented with worsening erythema and warmth of his right foot and leg over the last 2 days.  He denies any fevers or chills or other symptomatology.  No drainage noted from foot ulcers.  He has minimal edema noted over the dorsum of his right foot as well.  He has last seen his orthopedist Dr. Sharol Given approximately 1 week ago he did not have any issues at that time.  He is noted to have some findings of cellulitis and osteomyelitis and was started on Ancef as well as some IV fluid in the ED and was given some Dilaudid for pain control.  Review of Systems:  ROS All others reviewed and otherwise negative   Past Medical History: Past Medical History:  Diagnosis Date   Anxiety    Arthritis    "everywhere" (04/21/2012)   Cellulitis and abscess of foot 12/19/2013   LEFT FOOT   Chronic pain    DDD (degenerative disc disease)    Depression    Diabetes mellitus without complication ([place])    borderline   Diabetic foot ulcer ([place])    ETOH abuse    Full dentures    GERD (gastroesophageal reflux disease)    tums   History of blood transfusion    "related to left knee OR; probably right hip too" (04/21/2012)   Mental disorder    Neuromuscular disorder ([place])    neuropathy   Noncompliance    Open wound    bottom of foot   Osteomyelitis ([place])    right 4th toe   Pneumonia ~ 2012   Polysubstance abuse ([place])    etoh, cocaine, heroin   Stroke [facility]) 2008   "they said I might have had one during right hip  replacement" (04/21/2012)   Wears glasses     Past Surgical History: Past Surgical History:  Procedure Laterality Date   AMPUTATION Right 06/09/2017   Procedure: RIGHT 2ND TOE AMPUTATION;  Surgeon: [name], MD;  Location: [place];  Service: Orthopedics;  Laterality: Right;   AMPUTATION Right 07/23/2017   Procedure: RIGHT 4TH TOE AMPUTATION;  Surgeon: [name], MD;  Location: [place];  Service: Orthopedics;  Laterality: Right;   JOINT REPLACEMENT     KNEE ARTHROSCOPY Bilateral 1980's/1990's   LUNG LOBECTOMY Left ~ 2006   LUNG LOBECTOMY     METATARSAL OSTEOTOMY  10/29/2011   Procedure: METATARSAL OSTEOTOMY;  Surgeon: [name], MD;  Location: [place];  Service: Orthopedics;  Laterality: Left;  Left 1st Metatarsal Dorsal Closing Wedge    METATARSAL OSTEOTOMY Right 04/28/2017   Procedure: RIGHT 1ST METATARSAL DORSAL CLOSING WEDGE OSTEOTOMY;  Surgeon: [name], MD;  Location: [place];  Service: Orthopedics;  Laterality: Right;   MULTIPLE TOOTH EXTRACTIONS     REVISION TOTAL HIP ARTHROPLASTY Right 2008   "4-5 months after replacement" (04/21/2012)   TOTAL HIP ARTHROPLASTY Right 2008   TOTAL KNEE ARTHROPLASTY Left 2006   TOTAL KNEE ARTHROPLASTY Right 04/20/2012   TOTAL KNEE ARTHROPLASTY  Right 04/20/2012   Procedure: TOTAL KNEE ARTHROPLASTY;  Surgeon: [name], MD;  Location: [place];  Service: Orthopedics;  Laterality: Right;  Right Total Knee Arthroplasty     Allergies:   Allergies  Allergen Reactions   Benadryl [Diphenhydramine Hcl] Other (See Comments)    Leg spasms    Trazodone And Nefazodone Other (See Comments)    Leg spasms      Social History:  reports that he has been smoking cigarettes. He has a 30.00 pack-year smoking history. He has never used smokeless tobacco. He reports previous alcohol use. He reports previous drug use. Drug: Cocaine.   Family History: Family History  Problem Relation Age of Onset   Diabetes Father      Physical  Exam: Vitals:   06/12/18 1106 06/12/18 1109 06/12/18 1337  BP:  122/82 113/76  Pulse:  (!) 117 91  Resp:  20 18  Temp:  99 F (37.2 C)   TempSrc:  Oral   SpO2:  98% 100%  Weight: 83.9 kg    Height: 6\' 3"  (1.905 m)      Constitutional:  Alert and awake, oriented x3, not in any acute distress. Eyes: PERLA, EOMI, irises appear normal, anicteric sclera,  ENMT: external ears and nose appear normal            Lips appears normal, oropharynx mucosa, tongue, posterior pharynx appear normal  Neck: neck appears normal, no masses, normal ROM, no thyromegaly, no JVD  CVS: S1-S2 clear, no murmur rubs or gallops, no LE edema, normal pedal pulses  Respiratory:  clear to auscultation bilaterally, no wheezing, rales or rhonchi. Respiratory effort normal. No accessory muscle use.  Abdomen: soft nontender, nondistended, normal bowel sounds, no hepatosplenomegaly, no hernias  Musculoskeletal: : no cyanosis, clubbing or edema noted bilaterally, amputations and plantar ulcers noted on both feet Neuro: Cranial nerves II-XII intact, strength, sensation, reflexes Psych: judgement and insight appear normal, stable mood and affect, mental status Skin: no rashes or lesions or ulcers, no induration or nodules, right lower extremity with warmth, erythema, and tenderness noted  (Anything < 9 systems with 2 bullets each down codes to level 1) (If patient refuses exam cant bill higher level) (Make sure to document decubitus ulcers present on admission -- if possible -- and whether patient has chronic indwelling catheter at time of admission)  Data reviewed:  I have personally reviewed following labs and imaging studies Labs:  CBC: Recent Labs  Lab 06/12/18 1220  WBC 8.9  NEUTROABS 5.6  HGB 13.5  HCT 41.5  MCV 84.2  PLT 211    Basic Metabolic Panel: Recent Labs  Lab 06/12/18 1220  NA 134*  K 3.8  CL 96*  CO2 25  GLUCOSE 174*  BUN 13  CREATININE 0.90  CALCIUM 9.1   GFR Estimated Creatinine  Clearance: 110.1 mL/min (by C-G formula based on SCr of 0.9 mg/dL). Liver Function Tests: Recent Labs  Lab 06/12/18 1220  AST 19  ALT 17  ALKPHOS 95  BILITOT 0.6  PROT 7.2  ALBUMIN 3.9   No results for input(s): LIPASE, AMYLASE in the last 168 hours. No results for input(s): AMMONIA in the last 168 hours. Coagulation profile No results for input(s): INR, PROTIME in the last 168 hours.  Cardiac Enzymes: No results for input(s): CKTOTAL, CKMB, CKMBINDEX, TROPONINI in the last 168 hours. BNP: Invalid input(s): POCBNP CBG: No results for input(s): GLUCAP in the last 168 hours. D-Dimer No results for input(s): DDIMER in the last  72 hours. Hgb A1c No results for input(s): HGBA1C in the last 72 hours. Lipid Profile No results for input(s): CHOL, HDL, LDLCALC, TRIG, CHOLHDL, LDLDIRECT in the last 72 hours. Thyroid function studies No results for input(s): TSH, T4TOTAL, T3FREE, THYROIDAB in the last 72 hours.  Invalid input(s): FREET3 Anemia work up No results for input(s): VITAMINB12, FOLATE, FERRITIN, TIBC, IRON, RETICCTPCT in the last 72 hours. Urinalysis    Component Value Date/Time   COLORURINE STRAW (A) 09/04/2016 0741   APPEARANCEUR CLEAR 09/04/2016 0741   LABSPEC 1.001 (L) 09/04/2016 0741   PHURINE 6.0 09/04/2016 0741   GLUCOSEU >=500 (A) 09/04/2016 0741   HGBUR NEGATIVE 09/04/2016 0741   BILIRUBINUR NEGATIVE 09/04/2016 0741   KETONESUR 5 (A) 09/04/2016 0741   PROTEINUR NEGATIVE 09/04/2016 0741   UROBILINOGEN 0.2 12/18/2013 1402   NITRITE NEGATIVE 09/04/2016 0741   LEUKOCYTESUR NEGATIVE 09/04/2016 0741     Microbiology Recent Results (from the past 240 hour(s))  SARS Coronavirus 2 (CEPHEID - Performed in [place] hospital lab), Hosp Order     Status: None   Collection Time: 06/12/18 12:00 PM  Result Value Ref Range Status   SARS Coronavirus 2 NEGATIVE NEGATIVE Final    Comment: (NOTE) If result is NEGATIVE SARS-CoV-2 target nucleic acids are NOT  DETECTED. The SARS-CoV-2 RNA is generally detectable in upper and lower  respiratory specimens during the acute phase of infection. The lowest  concentration of SARS-CoV-2 viral copies this assay can detect is 250  copies / mL. A negative result does not preclude SARS-CoV-2 infection  and should not be used as the sole basis for treatment or other  patient management decisions.  A negative result may occur with  improper specimen collection / handling, submission of specimen other  than nasopharyngeal swab, presence of viral mutation(s) within the  areas targeted by this assay, and inadequate number of viral copies  (<250 copies / mL). A negative result must be combined with clinical  observations, patient history, and epidemiological information. If result is POSITIVE SARS-CoV-2 target nucleic acids are DETECTED. The SARS-CoV-2 RNA is generally detectable in upper and lower  respiratory specimens dur ing the acute phase of infection.  Positive  results are indicative of active infection with SARS-CoV-2.  Clinical  correlation with patient history and other diagnostic information is  necessary to determine patient infection status.  Positive results do  not rule out bacterial infection or co-infection with other viruses. If result is PRESUMPTIVE POSTIVE SARS-CoV-2 nucleic acids MAY BE PRESENT.   A presumptive positive result was obtained on the submitted specimen  and confirmed on repeat testing.  While 2019 novel coronavirus  (SARS-CoV-2) nucleic acids may be present in the submitted sample  additional confirmatory testing may be necessary for epidemiological  and / or clinical management purposes  to differentiate between  SARS-CoV-2 and other Sarbecovirus currently known to infect humans.  If clinically indicated additional testing with an alternate test  methodology [phone]) is advised. The SARS-CoV-2 RNA is generally  detectable in upper and lower respiratory sp ecimens during  the acute  phase of infection. The expected result is Negative. Fact Sheet for Patients:  StrictlyIdeas.no Fact Sheet for Healthcare Providers: BankingDealers.co.za This test is not yet approved or cleared by the [place] FDA and has been authorized for detection and/or diagnosis of SARS-CoV-2 by FDA under an Emergency Use Authorization (EUA).  This EUA will remain in effect (meaning this test can be used) for the duration of the COVID-19 declaration under  Section 564(b)(1) of the Act, 21 U.S.C. section 360bbb-3(b)(1), unless the authorization is terminated or revoked sooner. Performed at [facility], [address], [place], [place] [address]        Inpatient Medications:   Scheduled Meds:  nicotine  21 mg Transdermal Once   Continuous Infusions:  sodium chloride 75 mL/hr at 06/12/18 1224     Radiological Exams on Admission: Dg Foot Complete Right  Result Date: 06/12/2018 CLINICAL DATA:  Diabetic with foot ulceration EXAM: RIGHT FOOT COMPLETE - 3+ VIEW COMPARISON:  Mass June 22, 2017 and right foot MR June 22, 2017 FINDINGS: Frontal, oblique, and lateral views obtained. There is postoperative change at the right first tarsal-metatarsal joint with remodeling in the distal aspect of the medial cuneiform bone. This lucency in this area raises concern for bony destruction. There has been amputation of the second and fourth digits at the respective MTP joint levels. There is evidence of an old fracture of the distal aspect of the third metatarsal with remodeling. There is no acute fracture or dislocation. There is osteoarthritic change in the first MTP joint. No erosive changes noted. IMPRESSION: 1. Postoperative change in the first tarsal-metatarsal joint. There is apparent bony destruction involving the distal aspect of the medial cuneiform bone. There is ill-defined sequestrum between the proximal first and second metatarsals which may  be reflective of the osteomyelitis in this area. 2. Previous amputations of the second and fourth digits at the MTP joint levels. Soft tissue in these areas appears normal. 3.  Prior fracture of the distal third metatarsal with remodeling. 4.  Moderate arthropathy in the first MTP joint. Electronically Signed   By: Lowella Grip III M.D.   On: 06/12/2018 12:23    Impression/Recommendations Active Problems:   * No active hospital problems. *   Right lower extremity cellulitis with signs of osteomyelitis and diabetic patient -Discussed case with Dr. Sharol Given of orthopedics who is familiar with the patient and states that patient may be discharged on doxycycline 100 mg twice daily for treatment and he states he will follow-up with the patient on 5/26 for further evaluation.  The patient is currently not septic and is stable for discharge.  Would also recommend some oral pain medications as needed for pain management until he can get to his appointment.  He does not currently warrant inpatient admission.  Thank you for this consultation.  Our [facility] hospitalist team will follow the patient with you.   Time Spent: 35 minutes  Nayelli Inglis D Aftan Vint DO [facility] 06/12/2018, 1:56 PM

## 2018-06-12 NOTE — Progress Notes (Signed)
Pharmacy Antibiotic Note  [name] is a 53 y.o. male admitted on 06/12/2018 with RLE osteomyelitis. Pharmacy has been consulted for cefazolin dosing. Patient completed  a course of oral doxycycline that was prescribed on  05/25/18.   Patient received one dose of cefazolin 1g in the ED     Plan: Start cefazolin 2g IV q8h at 1800 tonight (Starting early since pt only got 1g in ED) Pharmacy to monitor labs, cultures, v/s and patient progress.  Height: 6\' 3"  (190.5 cm) Weight: 185 lb (83.9 kg) IBW/kg (Calculated) : 84.5  Temp (24hrs), Avg:99 F (37.2 C), Min:99 F (37.2 C), Max:99 F (37.2 C)  Recent Labs  Lab 06/12/18 1220  WBC 8.9  CREATININE 0.90    Estimated Creatinine Clearance: 110.1 mL/min (by C-G formula based on SCr of 0.9 mg/dL).    Allergies  Allergen Reactions  . Benadryl [Diphenhydramine Hcl] Other (See Comments)    Leg spasms   . Trazodone And Nefazodone Other (See Comments)    Leg spasms     Antimicrobials this admission: cefazolin 5/24 >>     Dose adjustments this admission: n/a   Microbiology results: no other cultures ordered at this time  5/24 SARS-CoV-2: negative    Thank you for allowing pharmacy to be a part of this patient's care.  Despina Pole, Pharm. D. Clinical Pharmacist 06/12/2018 4:31 PM

## 2018-06-12 NOTE — ED Triage Notes (Signed)
Pt reports diabetic ulcers on both feet x 6 months.  Reports ulcer to r foot has been getting worse for the past 2 days.

## 2018-06-12 NOTE — Progress Notes (Signed)
Pt to [id number] from [facility], oriented to room and dept.

## 2018-06-13 ENCOUNTER — Inpatient Hospital Stay (HOSPITAL_COMMUNITY): Payer: Medicare Other

## 2018-06-13 DIAGNOSIS — E11610 Type 2 diabetes mellitus with diabetic neuropathic arthropathy: Secondary | ICD-10-CM

## 2018-06-13 DIAGNOSIS — M869 Osteomyelitis, unspecified: Secondary | ICD-10-CM

## 2018-06-13 DIAGNOSIS — L03115 Cellulitis of right lower limb: Secondary | ICD-10-CM

## 2018-06-13 LAB — BASIC METABOLIC PANEL
Anion gap: 9 (ref 5–15)
BUN: 14 mg/dL (ref 6–20)
CO2: 26 mmol/L (ref 22–32)
Calcium: 8.6 mg/dL — ABNORMAL LOW (ref 8.9–10.3)
Chloride: 103 mmol/L (ref 98–111)
Creatinine, Ser: 0.94 mg/dL (ref 0.61–1.24)
GFR calc Af Amer: >60 mL/min (ref >60)
GFR calc non Af Amer: >60 mL/min (ref >60)
Glucose, Bld: 142 mg/dL — ABNORMAL HIGH (ref 70–99)
Potassium: 3.7 mmol/L (ref 3.5–5.1)
Sodium: 138 mmol/L (ref 135–145)

## 2018-06-13 LAB — HIV ANTIBODY (ROUTINE TESTING W REFLEX): HIV Screen 4th Generation wRfx: NONREACTIVE

## 2018-06-13 LAB — CBC
HCT: 36.3 % — ABNORMAL LOW (ref 39.0–52.0)
Hemoglobin: 11.7 g/dL — ABNORMAL LOW (ref 13.0–17.0)
MCH: 26.8 pg (ref 26.0–34.0)
MCHC: 32.2 g/dL (ref 30.0–36.0)
MCV: 83.1 fL (ref 80.0–100.0)
Platelets: 225 10*3/uL (ref 150–400)
RBC: 4.37 MIL/uL (ref 4.22–5.81)
RDW: 13.8 % (ref 11.5–15.5)
WBC: 6 10*3/uL (ref 4.0–10.5)
nRBC: 0 % (ref 0.0–0.2)

## 2018-06-13 LAB — C-REACTIVE PROTEIN: CRP: 5.7 mg/dL — ABNORMAL HIGH (ref <1.0)

## 2018-06-13 LAB — SEDIMENTATION RATE: Sed Rate: 30 mm/hr — ABNORMAL HIGH (ref 0–16)

## 2018-06-13 LAB — GLUCOSE, CAPILLARY
Glucose-Capillary: 120 mg/dL — ABNORMAL HIGH (ref 70–99)
Glucose-Capillary: 173 mg/dL — ABNORMAL HIGH (ref 70–99)
Glucose-Capillary: 139 mg/dL — ABNORMAL HIGH (ref 70–99)
Glucose-Capillary: 150 mg/dL — ABNORMAL HIGH (ref 70–99)

## 2018-06-13 MED ORDER — METRONIDAZOLE 500 MG PO TABS
500.0000 mg | ORAL_TABLET | Freq: Three times a day (TID) | ORAL | Status: DC
  Administered 2018-06-13 – 2018-06-16 (×9): 500 mg via ORAL
  Filled 2018-06-13 (×9): qty 1

## 2018-06-13 MED ORDER — NICOTINE 21 MG/24HR TD PT24
21.0000 mg | MEDICATED_PATCH | Freq: Every day | TRANSDERMAL | Status: DC
  Administered 2018-06-13 – 2018-06-17 (×5): 21 mg via TRANSDERMAL
  Filled 2018-06-13 (×5): qty 1

## 2018-06-13 MED ORDER — NICOTINE POLACRILEX 2 MG MT GUM
2.0000 mg | CHEWING_GUM | OROMUCOSAL | Status: DC | PRN
  Filled 2018-06-13: qty 1

## 2018-06-13 MED ORDER — LACTATED RINGERS IV SOLN
INTRAVENOUS | Status: AC
  Administered 2018-06-13: 17:00:00 via INTRAVENOUS

## 2018-06-13 MED ORDER — VANCOMYCIN HCL 10 G IV SOLR
1500.0000 mg | Freq: Two times a day (BID) | INTRAVENOUS | Status: DC
  Administered 2018-06-13 – 2018-06-16 (×6): 1500 mg via INTRAVENOUS
  Filled 2018-06-13 (×7): qty 1500

## 2018-06-13 MED ORDER — POLYETHYLENE GLYCOL 3350 17 G PO PACK
17.0000 g | PACK | Freq: Two times a day (BID) | ORAL | Status: DC
  Administered 2018-06-13 – 2018-06-17 (×7): 17 g via ORAL
  Filled 2018-06-13 (×7): qty 1

## 2018-06-13 MED ORDER — SODIUM CHLORIDE 0.9 % IV SOLN
2.0000 g | Freq: Three times a day (TID) | INTRAVENOUS | Status: DC
  Administered 2018-06-13 – 2018-06-16 (×8): 2 g via INTRAVENOUS
  Filled 2018-06-13 (×10): qty 2

## 2018-06-13 NOTE — Progress Notes (Addendum)
PROGRESS NOTE    [name]  IHW:[id number] DOB: 1965/08/11 DOA: 06/12/2018 PCP: Lucia Gaskins, MD  Brief Narrative:  [name] is [name] 53 y.o. male with medical history significant fordiabetes and diabetic foot ulcers, diabetic neuropathy, chronic pain, and prior fourth toe amputation of right foot who presented with worsening erythema and warmth of his right foot and leg over the last 2 days. He denies any fevers or chills or other symptomatology. No drainage noted from foot ulcers.  He was noted to have recent treatment on oral doxycycline for 2 weeks that he has completed and appears to have failed outpatient treatment.He has minimal edema noted over the dorsum of his right foot as well. He has last seen his orthopedist Dr. Sharol Given approximately 1 week ago he did not have any issues at that time.   ED Course: He has been started on some IV fluid as well as Ancef and given some Dilaudid for pain control.  Right foot x-ray does appear to demonstrate some osteomyelitis.  Laboratory data otherwise unremarkable with no signs of sepsis noted.  Discussed case with Dr. Sharol Given and initially felt that he could be discharged until information about his recent oral antibiotic use came to light.  He now recommends IV antibiotics and admission to [facility] where he will see patient in [place].m[place] for further evaluation and management.  Assessment & Plan:   Active Problems:   Cellulitis   Osteomyelitis of right foot (HCC)   Cellulitis of right foot   Charcot foot due to diabetes mellitus (HCC)   Right Diabetic Foot Ulcer   Right Lower Extremity Cellulitis Left Diabetic Foot Ulcer  - cefepime/vanc/flagyl - ABI's - Right lower extremity with evidence of cellulitis - MRI R foot with soft tissue ulcer along plantar aspect of second metatarsal head, severe osteomyelitis of 2nd metatarsal head.  Heterogenous T2 hyperintense signal around the first metatarsal head approximately 8 mm circumferentially concerning  for abscess versus phlegmon.  Prior lisfran fracture subluxation with prior ORIF.  Advanced OA of first tarsometatarsal joint secondary to instability with subchondral marrow edema -Left foot with diabetic wound as well, follow MRI of left foot as well - ABI's, he's got palpable pulses bilaterally to le's - Dr. Sharol Given planning for transmetatarsal amputation of R on Wed.  Pt will need protective shoe wear for L foot.  - ESR 30, CRP 5.7  Type 2 diabetes -Maintain on SSI -mealtime insulin ordered -Repeat A1c -Hold home medications  Chronic pain -Maintain on home medications with IV pain medication for breakthrough  Dyslipidemia -Maintain on statin  DVT prophylaxis: lovenox Code Status: full  Family Communication: none at bedside Disposition Plan: pending surgery   Consultants:   Orthopedics   Procedures: none  Antimicrobials:  Anti-infectives (From admission, onward)   Start     Dose/Rate Route Frequency Ordered Stop   06/13/18 2000  ceFEPIme (MAXIPIME) 2 g in sodium chloride 0.9 % 100 mL IVPB     2 g 200 mL/hr over 30 Minutes Intravenous Every 8 hours 06/13/18 1621     06/13/18 1800  vancomycin (VANCOCIN) 1,500 mg in sodium chloride 0.9 % 500 mL IVPB     1,500 mg 250 mL/hr over 120 Minutes Intravenous Every 12 hours 06/13/18 1625     06/13/18 1700  metroNIDAZOLE (FLAGYL) tablet 500 mg     500 mg Oral Every 8 hours 06/13/18 1607     06/12/18 1800  ceFAZolin (ANCEF) IVPB 2g/100 mL premix  Status:  Discontinued  2 g 200 mL/hr over 30 Minutes Intravenous Every 8 hours 06/12/18 1627 06/13/18 1618   06/12/18 1215  ceFAZolin (ANCEF) IVPB 1 g/50 mL premix     1 g 100 mL/hr over 30 Minutes Intravenous  Once 06/12/18 1200 06/12/18 1545     Subjective: Feels ok Came due to RLE discomfort  Objective: Vitals:   06/12/18 1109 06/12/18 1337 06/12/18 2039 06/13/18 0542  BP: 122/82 113/76 116/66 125/81  Pulse: (!) 117 91 88 88  Resp: '20 18 16 16  '$ Temp: 99 F (37.2 C)   98.1 F (36.7 C) 98.8 F (37.1 C)  TempSrc: Oral  Oral Oral  SpO2: 98% 100% 98% 96%  Weight:      Height:        Intake/Output Summary (Last 24 hours) at 06/13/2018 1605 Last data filed at 06/13/2018 1559 Gross per 24 hour  Intake 1247.78 ml  Output 900 ml  Net 347.78 ml   Filed Weights   06/12/18 1106  Weight: 83.9 kg    Examination:  General exam: Appears calm and comfortable  Respiratory system: Clear to auscultation. Respiratory effort normal. Cardiovascular system: S1 & S2 heard, RRR. Gastrointestinal system: Abdomen is nondistended, soft and nontender.  Central nervous system: Alert and oriented. No focal neurological deficits. Extremities: RLE cellulitis.  Bilateral plantar ulcers, left appears worse than R, but no redness to left Psychiatry: Judgement and insight appear normal. Mood & affect appropriate.     Data Reviewed: I have personally reviewed following labs and imaging studies  CBC: Recent Labs  Lab 06/12/18 1220 06/13/18 0239  WBC 8.9 6.0  NEUTROABS 5.6  --   HGB 13.5 11.7*  HCT 41.5 36.3*  MCV 84.2 83.1  PLT 236 297   Basic Metabolic Panel: Recent Labs  Lab 06/12/18 1220 06/13/18 0239  NA 134* 138  K 3.8 3.7  CL 96* 103  CO2 25 26  GLUCOSE 174* 142*  BUN 13 14  CREATININE 0.90 0.94  CALCIUM 9.1 8.6*   GFR: Estimated Creatinine Clearance: 105.4 mL/min (by C-G formula based on SCr of 0.94 mg/dL). Liver Function Tests: Recent Labs  Lab 06/12/18 1220  AST 19  ALT 17  ALKPHOS 95  BILITOT 0.6  PROT 7.2  ALBUMIN 3.9   No results for input(s): LIPASE, AMYLASE in the last 168 hours. No results for input(s): AMMONIA in the last 168 hours. Coagulation Profile: No results for input(s): INR, PROTIME in the last 168 hours. Cardiac Enzymes: No results for input(s): CKTOTAL, CKMB, CKMBINDEX, TROPONINI in the last 168 hours. BNP (last 3 results) No results for input(s): PROBNP in the last 8760 hours. HbA1C: No results for input(s):  HGBA1C in the last 72 hours. CBG: Recent Labs  Lab 06/12/18 1726 06/12/18 2036 06/13/18 0758 06/13/18 1202  GLUCAP 129* 146* 120* 173*   Lipid Profile: No results for input(s): CHOL, HDL, LDLCALC, TRIG, CHOLHDL, LDLDIRECT in the last 72 hours. Thyroid Function Tests: No results for input(s): TSH, T4TOTAL, FREET4, T3FREE, THYROIDAB in the last 72 hours. Anemia Panel: No results for input(s): VITAMINB12, FOLATE, FERRITIN, TIBC, IRON, RETICCTPCT in the last 72 hours. Sepsis Labs: No results for input(s): PROCALCITON, LATICACIDVEN in the last 168 hours.  Recent Results (from the past 240 hour(s))  SARS Coronavirus 2 (CEPHEID - Performed in [place] hospital lab), Hosp Order     Status: None   Collection Time: 06/12/18 12:00 PM  Result Value Ref Range Status   SARS Coronavirus 2 NEGATIVE NEGATIVE Final  Comment: (NOTE) If result is NEGATIVE SARS-CoV-2 target nucleic acids are NOT DETECTED. The SARS-CoV-2 RNA is generally detectable in upper and lower  respiratory specimens during the acute phase of infection. The lowest  concentration of SARS-CoV-2 viral copies this assay can detect is 250  copies / mL. Arben Packman negative result does not preclude SARS-CoV-2 infection  and should not be used as the sole basis for treatment or other  patient management decisions.  Debany Vantol negative result may occur with  improper specimen collection / handling, submission of specimen other  than nasopharyngeal swab, presence of viral mutation(s) within the  areas targeted by this assay, and inadequate number of viral copies  (<250 copies / mL). Lanyla Costello negative result must be combined with clinical  observations, patient history, and epidemiological information. If result is POSITIVE SARS-CoV-2 target nucleic acids are DETECTED. The SARS-CoV-2 RNA is generally detectable in upper and lower  respiratory specimens dur ing the acute phase of infection.  Positive  results are indicative of active infection with  SARS-CoV-2.  Clinical  correlation with patient history and other diagnostic information is  necessary to determine patient infection status.  Positive results do  not rule out bacterial infection or co-infection with other viruses. If result is PRESUMPTIVE POSTIVE SARS-CoV-2 nucleic acids MAY BE PRESENT.   Menachem Urbanek presumptive positive result was obtained on the submitted specimen  and confirmed on repeat testing.  While 2019 novel coronavirus  (SARS-CoV-2) nucleic acids may be present in the submitted sample  additional confirmatory testing may be necessary for epidemiological  and / or clinical management purposes  to differentiate between  SARS-CoV-2 and other Sarbecovirus currently known to infect humans.  If clinically indicated additional testing with an alternate test  methodology [id number]) is advised. The SARS-CoV-2 RNA is generally  detectable in upper and lower respiratory sp ecimens during the acute  phase of infection. The expected result is Negative. Fact Sheet for Patients:  StrictlyIdeas.no Fact Sheet for Healthcare Providers: BankingDealers.co.za This test is not yet approved or cleared by the [place] FDA and has been authorized for detection and/or diagnosis of SARS-CoV-2 by FDA under an Emergency Use Authorization (EUA).  This EUA will remain in effect (meaning this test can be used) for the duration of the COVID-19 declaration under Section 564(b)(1) of the Act, 21 U.S.C. section 360bbb-3(b)(1), unless the authorization is terminated or revoked sooner. Performed at [facility], [address] [place], [place] [address]          Radiology Studies: Mr Foot Right Wo Contrast  Result Date: 06/13/2018 CLINICAL DATA:  Diabetic foot ulcer, chronic pain, prior fourth toe amputation EXAM: MRI OF THE RIGHT FOREFOOT WITHOUT CONTRAST TECHNIQUE: Multiplanar, multisequence MR imaging of the right forefoot was performed. No  intravenous contrast was administered. COMPARISON:  None. FINDINGS: Bones/Joint/Cartilage Prior amputation of the second and fourth toes. Soft tissue ulcer along the plantar aspect of the second metatarsal head extending down to the cortical surface of the second metatarsal head. Cortical destruction of the plantar aspect of the second metatarsal head with severe marrow edema throughout the metatarsal head and shaft most consistent with osteomyelitis. Heterogeneous T2 hyperintense signal around the first metatarsal head measuring approximately 8 mm in thickness circumferentially concerning for abscess versus phlegmon. Prior Lisfranc fracture subluxation with prior ORIF. Advanced osteoarthritis of the first tarsometatarsal joint secondary to instability with subchondral marrow edema. Lateral subluxation of the second metatarsal base relative to the middle cuneiform. Mild osteoarthritis of the second and third TMT joints.  No acute fracture or dislocation. Mild osteoarthritis of the first MTP joint. Ligaments Collateral ligaments are intact. Muscles and Tendons Flexor, peroneal and extensor compartment tendons are intact. Muscles are normal. Soft tissue No fluid collection or hematoma. No soft tissue mass. Soft tissue edema along the dorsal aspect of the forefoot. IMPRESSION: 1. Soft tissue ulcer along the plantar aspect of the second metatarsal head. Severe osteomyelitis of the second metatarsal head. Heterogeneous T2 hyperintense signal around the first metatarsal head measuring approximately 8 mm in thickness circumferentially concerning for abscess versus phlegmon. 2. Prior Lisfranc fracture subluxation with prior ORIF. Advanced osteoarthritis of the first tarsometatarsal joint secondary to instability with subchondral marrow edema. Electronically Signed   By: Kathreen Devoid   On: 06/13/2018 11:36   Dg Foot Complete Right  Result Date: 06/12/2018 CLINICAL DATA:  Diabetic with foot ulceration EXAM: RIGHT FOOT  COMPLETE - 3+ VIEW COMPARISON:  Mass June 22, 2017 and right foot MR June 22, 2017 FINDINGS: Frontal, oblique, and lateral views obtained. There is postoperative change at the right first tarsal-metatarsal joint with remodeling in the distal aspect of the medial cuneiform bone. This lucency in this area raises concern for bony destruction. There has been amputation of the second and fourth digits at the respective MTP joint levels. There is evidence of an old fracture of the distal aspect of the third metatarsal with remodeling. There is no acute fracture or dislocation. There is osteoarthritic change in the first MTP joint. No erosive changes noted. IMPRESSION: 1. Postoperative change in the first tarsal-metatarsal joint. There is apparent bony destruction involving the distal aspect of the medial cuneiform bone. There is ill-defined sequestrum between the proximal first and second metatarsals which may be reflective of the osteomyelitis in this area. 2. Previous amputations of the second and fourth digits at the MTP joint levels. Soft tissue in these areas appears normal. 3.  Prior fracture of the distal third metatarsal with remodeling. 4.  Moderate arthropathy in the first MTP joint. Electronically Signed   By: Lowella Grip III M.D.   On: 06/12/2018 12:23        Scheduled Meds:  cholecalciferol  10,000 Units Oral Daily   enoxaparin (LOVENOX) injection  40 mg Subcutaneous Q24H   insulin aspart  0-5 Units Subcutaneous QHS   insulin aspart  0-9 Units Subcutaneous TID WC   insulin aspart  3 Units Subcutaneous TID WC   nicotine  21 mg Transdermal Daily   QUEtiapine  100 mg Oral QHS   rosuvastatin  20 mg Oral Daily   vitamin C  500 mg Oral Daily   Continuous Infusions:  sodium chloride 75 mL/hr at 06/13/18 0225    ceFAZolin (ANCEF) IV Stopped (06/13/18 1224)     LOS: 1 day    Time spent: over 30 min    Fayrene Helper, MD [facility] Pager AMION  If 7PM-7AM,  please contact night-coverage [email or web address] Password [email or web address] 06/13/2018, 4:05 PM

## 2018-06-13 NOTE — Progress Notes (Signed)
Pharmacy Antibiotic Note  [name] is a 53 y.o. male admitted on 06/12/2018 with RLE osteomyelitis, DM foot infection.  Pharmacy has been consulted for Vancomycin & Cefepime dosing. Flagyl per MD  Plan: Discontinue Ancef Cefepime 2gm q8 Vancomycin 1500mg  q12, for AUC 537, using SCr 0.94 Flagyl 500mg  po q8  Height: 6\' 3"  (190.5 cm) Weight: 185 lb (83.9 kg) IBW/kg (Calculated) : 84.5  Temp (24hrs), Avg:98.5 F (36.9 C), Min:98.1 F (36.7 C), Max:98.8 F (37.1 C)  Recent Labs  Lab 06/12/18 1220 06/13/18 0239  WBC 8.9 6.0  CREATININE 0.90 0.94    Estimated Creatinine Clearance: 105.4 mL/min (by C-G formula based on SCr of 0.94 mg/dL).    Allergies  Allergen Reactions  . Benadryl [Diphenhydramine Hcl] Other (See Comments)    Leg spasms   . Trazodone And Nefazodone Other (See Comments)    Leg spasms     Antimicrobials this admission: Cefazolin 5/24 >> 5/25 Vanc 5/25 >> Cefepime 5/25 >> Flagyl po 5/25 >>  Dose adjustments this admission:  Microbiology results:  5/24 SARS-CoV-2: negative  Thank you for allowing pharmacy to be a part of this patient's care.  Minda Ditto 06/13/2018 4:25 PM

## 2018-06-13 NOTE — Consult Note (Signed)
ORTHOPAEDIC CONSULTATION  REQUESTING PHYSICIAN: Elodia Florence., *  Chief Complaint: Cellulitis right foot.  HPI: [name] is a 53 y.o. male who presents with cellulitis right foot.  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  Patient has ulceration beneath the second metatarsal head on the right foot first metatarsal head of the left foot.  Past Medical History:  Diagnosis Date  . Anxiety   . Arthritis    "everywhere" (04/21/2012)  . Cellulitis and abscess of foot 12/19/2013   LEFT FOOT  . Chronic pain   . DDD (degenerative disc disease)   . Depression   . Diabetes mellitus without complication (HCC)    borderline  . Diabetic foot ulcer ([place])   . ETOH abuse   . Full dentures   . GERD (gastroesophageal reflux disease)    tums  . History of blood transfusion    "related to left knee OR; probably right hip too" (04/21/2012)  . Mental disorder   . Neuromuscular disorder (HCC)    neuropathy  . Noncompliance   . Open wound    bottom of foot  . Osteomyelitis ([place])    right 4th toe  . Pneumonia ~ 2012  . Polysubstance abuse (HCC)    etoh, cocaine, heroin  . Stroke [facility]) 2008   "they said I might have had one during right hip replacement" (04/21/2012)  . Wears glasses    Past Surgical History:  Procedure Laterality Date  . AMPUTATION Right 06/09/2017   Procedure: RIGHT 2ND TOE AMPUTATION;  Surgeon: [name], MD;  Location: [place];  Service: Orthopedics;  Laterality: Right;  . AMPUTATION Right 07/23/2017   Procedure: RIGHT 4TH TOE AMPUTATION;  Surgeon: [name], MD;  Location: [place];  Service: Orthopedics;  Laterality: Right;  . JOINT REPLACEMENT    . KNEE ARTHROSCOPY Bilateral 1980's/1990's  . LUNG LOBECTOMY Left ~ 2006  . LUNG LOBECTOMY    . METATARSAL OSTEOTOMY  10/29/2011   Procedure: METATARSAL OSTEOTOMY;   Surgeon: [name], MD;  Location: [place];  Service: Orthopedics;  Laterality: Left;  Left 1st Metatarsal Dorsal Closing Wedge   . METATARSAL OSTEOTOMY Right 04/28/2017   Procedure: RIGHT 1ST METATARSAL DORSAL CLOSING WEDGE OSTEOTOMY;  Surgeon: [name], MD;  Location: [place];  Service: Orthopedics;  Laterality: Right;  . MULTIPLE TOOTH EXTRACTIONS    . REVISION TOTAL HIP ARTHROPLASTY Right 2008   "4-5 months after replacement" (04/21/2012)  . TOTAL HIP ARTHROPLASTY Right 2008  . TOTAL KNEE ARTHROPLASTY Left 2006  . TOTAL KNEE ARTHROPLASTY Right 04/20/2012  . TOTAL KNEE ARTHROPLASTY Right 04/20/2012   Procedure: TOTAL KNEE ARTHROPLASTY;  Surgeon: [name], MD;  Location: [place];  Service: Orthopedics;  Laterality: Right;  Right Total Knee Arthroplasty   Social History   Socioeconomic History  . Marital status: Divorced    Spouse name: Not on file  . Number of children: Not on file  . Years of education: Not on file  . Highest education level: Not on file  Occupational History  . Not on file  Social Needs  . Financial resource strain: Not on file  . Food insecurity:    Worry: Not on file    Inability: Not on file  . Transportation needs:    Medical: Not on file    Non-medical: Not on file  Tobacco  Use  . Smoking status: Current Every Day Smoker    Packs/day: 1.00    Years: 30.00    Pack years: 30.00    Types: Cigarettes  . Smokeless tobacco: Never Used  Substance and Sexual Activity  . Alcohol use: Not Currently    Alcohol/week: 0.0 standard drinks    Frequency: Never  . Drug use: Not Currently    Types: Cocaine    Comment: Cocaine and heroin - 10 years ago  . Sexual activity: Yes    Birth control/protection: None  Lifestyle  . Physical activity:    Days per week: Not on file    Minutes per session: Not on file  . Stress: Not on file  Relationships  . Social connections:    Talks on phone: Not on file    Gets together: Not on file    Attends religious  service: Not on file    Active member of club or organization: Not on file    Attends meetings of clubs or organizations: Not on file    Relationship status: Not on file  Other Topics Concern  . Not on file  Social History Narrative  . Not on file   Family History  Problem Relation Age of Onset  . Diabetes Father    - negative except otherwise stated in the family history section Allergies  Allergen Reactions  . Benadryl [Diphenhydramine Hcl] Other (See Comments)    Leg spasms   . Trazodone And Nefazodone Other (See Comments)    Leg spasms    Prior to Admission medications   Medication Sig Start Date End Date Taking? Authorizing Provider  Cholecalciferol (VITAMIN D3) 5000 units CAPS Take 10,000 Units by mouth daily.   Yes [provider]  doxycycline (VIBRA-TABS) 100 MG tablet Take 1 tablet (100 mg total) by mouth every 12 (twelve) hours. 06/28/17  Yes Donne Hazel, MD  metFORMIN (GLUCOPHAGE) 500 MG tablet Take 1 tablet (500 mg total) by mouth 2 (two) times daily with a meal. Patient taking differently: Take 1,000 mg by mouth 2 (two) times daily with a meal.  01/15/14  Yes Philemon Kingdom, MD  silver sulfADIAZINE (SILVADENE) 1 % cream Apply 1 application topically daily. 05/25/18  Yes Dondra Prader R, NP  vitamin C (ASCORBIC ACID) 500 MG tablet Take 500 mg by mouth daily.   Yes [provider]  acetaminophen (TYLENOL) 325 MG tablet Take 2 tablets (650 mg total) by mouth every 6 (six) hours as needed for mild pain (or Fever >/= 101). Patient not taking: Reported on 06/12/2018 06/10/17   Georgette Shell, MD  blood glucose meter kit and supplies KIT Dispense based on patient and insurance preference. Use up to four times daily as directed. (FOR ICD-9 250.00, 250.01). Patient not taking: Reported on 06/12/2018 08/18/16   Orvan Falconer, MD  dapagliflozin propanediol (FARXIGA) 10 MG TABS tablet Take 10 mg by mouth daily.    [provider]  diltiazem (CARDIZEM) 30  MG tablet Take 1 tablet (30 mg total) by mouth 2 (two) times daily. Patient not taking: Reported on 06/12/2018 09/10/16   Lendon Colonel, NP  doxycycline (VIBRA-TABS) 100 MG tablet Take 1 tablet (100 mg total) by mouth 2 (two) times daily. Patient not taking: Reported on 06/12/2018 05/25/18   Suzan Slick, NP  hydrOXYzine (ATARAX/VISTARIL) 10 MG tablet Take 1 tablet (10 mg total) by mouth every 6 (six) hours as needed for anxiety. Patient not taking: Reported on 06/12/2018 06/28/17  Donne Hazel, MD  oxyCODONE-acetaminophen (PERCOCET/ROXICET) 5-325 MG tablet Take 1 tablet by mouth every 8 (eight) hours as needed. Patient not taking: Reported on 06/12/2018 08/04/17   Suzan Slick, NP  oxyCODONE-acetaminophen (PERCOCET/ROXICET) 5-325 MG tablet Take 1 tablet by mouth every 8 (eight) hours as needed. Patient not taking: Reported on 06/12/2018 08/19/17   [name], MD  QUEtiapine (SEROQUEL) 100 MG tablet Take 100 mg by mouth at bedtime.    [provider]  rosuvastatin (CRESTOR) 20 MG tablet Take 20 mg by mouth daily. 01/20/17   [provider]  saccharomyces boulardii (FLORASTOR) 250 MG capsule Take 1 capsule (250 mg total) by mouth 2 (two) times daily. Patient not taking: Reported on 06/12/2018 06/28/17   Donne Hazel, MD  sulfamethoxazole-trimethoprim (BACTRIM DS,SEPTRA DS) 800-160 MG tablet Take 1 tablet by mouth 2 (two) times daily. Patient not taking: Reported on 06/12/2018 07/07/17   Suzan Slick, NP   Dg Foot Complete Right  Result Date: 06/12/2018 CLINICAL DATA:  Diabetic with foot ulceration EXAM: RIGHT FOOT COMPLETE - 3+ VIEW COMPARISON:  Mass June 22, 2017 and right foot MR June 22, 2017 FINDINGS: Frontal, oblique, and lateral views obtained. There is postoperative change at the right first tarsal-metatarsal joint with remodeling in the distal aspect of the medial cuneiform bone. This lucency in this area raises concern for bony destruction. There has been amputation of  the second and fourth digits at the respective MTP joint levels. There is evidence of an old fracture of the distal aspect of the third metatarsal with remodeling. There is no acute fracture or dislocation. There is osteoarthritic change in the first MTP joint. No erosive changes noted. IMPRESSION: 1. Postoperative change in the first tarsal-metatarsal joint. There is apparent bony destruction involving the distal aspect of the medial cuneiform bone. There is ill-defined sequestrum between the proximal first and second metatarsals which may be reflective of the osteomyelitis in this area. 2. Previous amputations of the second and fourth digits at the MTP joint levels. Soft tissue in these areas appears normal. 3.  Prior fracture of the distal third metatarsal with remodeling. 4.  Moderate arthropathy in the first MTP joint. Electronically Signed   By: Lowella Grip III M.D.   On: 06/12/2018 12:23   - pertinent xrays, CT, MRI studies were reviewed and independently interpreted  Positive ROS: All other systems have been reviewed and were otherwise negative with the exception of those mentioned in the HPI and as above.  Physical Exam: General: Alert, no acute distress Psychiatric: Patient is competent for consent with normal mood and affect Lymphatic: No axillary or cervical lymphadenopathy Cardiovascular: No pedal edema Respiratory: No cyanosis, no use of accessory musculature GI: No organomegaly, abdomen is soft and non-tender    Images:  _0 @  Labs:  Lab Results  Component Value Date   HGBA1C 6.7 (H) 06/07/2017   HGBA1C 10.3 (H) 08/17/2016   HGBA1C 7.7 (H) 12/18/2013   ESRSEDRATE 5 06/04/2017   ESRSEDRATE 110 (H) 12/19/2013   ESRSEDRATE 104 (H) 12/18/2013   CRP <0.8 06/05/2017   CRP 12.2 (H) 12/19/2013   CRP 14.9 (H) 12/18/2013   REPTSTATUS 12/24/2013 FINAL 12/18/2013   REPTSTATUS 12/24/2013 FINAL 12/18/2013   GRAMSTAIN  04/19/2011    WBC PRESENT,BOTH PMN AND  MONONUCLEAR NO ORGANISMS SEEN   CULT  12/18/2013    NO GROWTH 5 DAYS Performed at [facility]  12/18/2013    NO GROWTH 5  DAYS Performed at [facility]     Lab Results  Component Value Date   ALBUMIN 3.9 06/12/2018   ALBUMIN 3.8 06/05/2017   ALBUMIN 4.3 12/14/2016    Neurologic: Patient does not have protective sensation bilateral lower extremities.   MUSCULOSKELETAL:   Skin: Examination patient has cellulitis of the right foot.  He has a Medical illustrator grade 3 ulcer beneath the second metatarsal head of the right foot which probes to bone.  He has a Medical illustrator grade 1 ulcer beneath the first metatarsal head of the left foot with healthy granulation tissue that does not probe to bone.  Patient has a strong dorsalis pedis pulse bilaterally there is some dermatitis in both legs.  Review of the radiographs shows destructive bony changes across the closing wedge osteotomy of the base of the first metatarsal and across the Lisfranc complex which is most consistent with Charcot arthropathy.  Patient has no cellulitis no swelling no tenderness to palpation at this site.   The radiographs do show lytic bony changes of the second metatarsal head which is consistent with the ulcer and osteomyelitis.  Review of the radiographs shows  Assessment: Assessment: Diabetic insensate neuropathy with Charcot collapse across the Lisfranc complex of the right foot status post closing wedge osteotomy of the base of the first metatarsal with lytic bone changes and osteomyelitis of the second metatarsal head with a Wegner grade 3 ulcer on the right foot.  A Wegner grade 1 ulcer beneath the first metatarsal head of the left foot.  Plan: Plan: Discussed with the patient with the abnormal alignment of the forefoot on the right osteomyelitis of the second metatarsal status post 2 toe amputations I feel the best option to provide him a plantigrade foot without risk of further breakdown would be  to proceed with a transmetatarsal amputation.  Patient states he understands wished to proceed.  Plan for transmetatarsal amputation of the right on Wednesday.  Patient will need protective shoe wear for the left foot.  Thank you for the consult and the opportunity to see Mr. Shelda Jakes, MD [facility] [phone] 9:17 AM

## 2018-06-13 NOTE — Progress Notes (Signed)
Pt to MRI

## 2018-06-14 LAB — CBC
HCT: 37.1 % — ABNORMAL LOW (ref 39.0–52.0)
Hemoglobin: 12 g/dL — ABNORMAL LOW (ref 13.0–17.0)
MCH: 26.9 pg (ref 26.0–34.0)
MCHC: 32.3 g/dL (ref 30.0–36.0)
MCV: 83.2 fL (ref 80.0–100.0)
Platelets: 241 10*3/uL (ref 150–400)
RBC: 4.46 MIL/uL (ref 4.22–5.81)
RDW: 13.7 % (ref 11.5–15.5)
WBC: 6.9 10*3/uL (ref 4.0–10.5)
nRBC: 0 % (ref 0.0–0.2)

## 2018-06-14 LAB — COMPREHENSIVE METABOLIC PANEL
ALT: 14 U/L (ref 0–44)
AST: 19 U/L (ref 15–41)
Albumin: 2.8 g/dL — ABNORMAL LOW (ref 3.5–5.0)
Alkaline Phosphatase: 75 U/L (ref 38–126)
Anion gap: 8 (ref 5–15)
BUN: 12 mg/dL (ref 6–20)
CO2: 26 mmol/L (ref 22–32)
Calcium: 8.9 mg/dL (ref 8.9–10.3)
Chloride: 105 mmol/L (ref 98–111)
Creatinine, Ser: 0.81 mg/dL (ref 0.61–1.24)
GFR calc Af Amer: >60 mL/min (ref >60)
GFR calc non Af Amer: >60 mL/min (ref >60)
Glucose, Bld: 162 mg/dL — ABNORMAL HIGH (ref 70–99)
Potassium: 3.8 mmol/L (ref 3.5–5.1)
Sodium: 139 mmol/L (ref 135–145)
Total Bilirubin: 0.2 mg/dL — ABNORMAL LOW (ref 0.3–1.2)
Total Protein: 5.4 g/dL — ABNORMAL LOW (ref 6.5–8.1)

## 2018-06-14 LAB — MAGNESIUM: Magnesium: 1.9 mg/dL (ref 1.7–2.4)

## 2018-06-14 LAB — GLUCOSE, CAPILLARY
Glucose-Capillary: 131 mg/dL — ABNORMAL HIGH (ref 70–99)
Glucose-Capillary: 93 mg/dL (ref 70–99)
Glucose-Capillary: 131 mg/dL — ABNORMAL HIGH (ref 70–99)
Glucose-Capillary: 132 mg/dL — ABNORMAL HIGH (ref 70–99)

## 2018-06-14 LAB — HEMOGLOBIN A1C
Hgb A1c MFr Bld: 6.2 % — ABNORMAL HIGH (ref 4.8–5.6)
Mean Plasma Glucose: 131 mg/dL

## 2018-06-14 LAB — SURGICAL PCR SCREEN
MRSA, PCR: NEGATIVE
Staphylococcus aureus: NEGATIVE

## 2018-06-14 MED ORDER — LACTULOSE 10 GM/15ML PO SOLN
30.0000 g | Freq: Once | ORAL | Status: AC
  Administered 2018-06-14: 30 g via ORAL
  Filled 2018-06-14: qty 45

## 2018-06-14 MED ORDER — CHLORHEXIDINE GLUCONATE 4 % EX LIQD
60.0000 mL | Freq: Once | CUTANEOUS | Status: AC
  Administered 2018-06-15: 4 via TOPICAL
  Filled 2018-06-14: qty 60

## 2018-06-14 MED ORDER — POVIDONE-IODINE 10 % EX SWAB
2.0000 "application " | Freq: Once | CUTANEOUS | Status: AC
  Administered 2018-06-15: 2 via TOPICAL

## 2018-06-14 MED ORDER — CLOTRIMAZOLE 1 % EX CREA
TOPICAL_CREAM | Freq: Two times a day (BID) | CUTANEOUS | Status: DC
  Administered 2018-06-14 – 2018-06-17 (×6): via TOPICAL
  Filled 2018-06-14: qty 15

## 2018-06-14 MED ORDER — CEFAZOLIN SODIUM-DEXTROSE 2-4 GM/100ML-% IV SOLN
2.0000 g | INTRAVENOUS | Status: AC
  Administered 2018-06-15: 2 g via INTRAVENOUS
  Filled 2018-06-14 (×2): qty 100

## 2018-06-14 MED ORDER — SENNOSIDES-DOCUSATE SODIUM 8.6-50 MG PO TABS
2.0000 | ORAL_TABLET | Freq: Every day | ORAL | Status: DC
  Administered 2018-06-14 – 2018-06-15 (×2): 2 via ORAL
  Filled 2018-06-14 (×2): qty 2

## 2018-06-14 MED ORDER — CHLORHEXIDINE GLUCONATE 4 % EX LIQD: 60.0000 mL | Freq: Once | CUTANEOUS | Status: DC

## 2018-06-14 NOTE — H&P (View-Only) (Signed)
Patient ID: [name], male   DOB: Mar 22, 1965, 53 y.o.   MRN: [id number] MRI scan right foot confirms osteomyelitis of the second metatarsal.  We will plan for a right transmetatarsal amputation on Wednesday.

## 2018-06-14 NOTE — Progress Notes (Signed)
PROGRESS NOTE    [name]  NOT:[id number] DOB: Jun 02, 1965 DOA: 06/12/2018 PCP: Lucia Gaskins, MD  Brief Narrative:  [name] is [name] 53 y.o. male with medical history significant fordiabetes and diabetic foot ulcers, diabetic neuropathy, chronic pain, and prior fourth toe amputation of right foot who presented with worsening erythema and warmth of his right foot and leg over the last 2 days. He denies any fevers or chills or other symptomatology. No drainage noted from foot ulcers.  He was noted to have recent treatment on oral doxycycline for 2 weeks that he has completed and appears to have failed outpatient treatment.He has minimal edema noted over the dorsum of his right foot as well. He has last seen his orthopedist Dr. Sharol Given approximately 1 week ago he did not have any issues at that time.   ED Course: He has been started on some IV fluid as well as Ancef and given some Dilaudid for pain control.  Right foot x-ray does appear to demonstrate some osteomyelitis.  Laboratory data otherwise unremarkable with no signs of sepsis noted.  Discussed case with Dr. Sharol Given and initially felt that he could be discharged until information about his recent oral antibiotic use came to light.  He now recommends IV antibiotics and admission to [facility] where he will see patient in [name].m. for further evaluation and management.  Imaging with severe osteomyelitis of second metatarsal head on R foot.  Ortho planning for transmetatarsal amputation on 5/27.  Assessment & Plan:   Active Problems:   Cellulitis   Osteomyelitis of right foot (HCC)   Cellulitis of right foot   Charcot foot due to diabetes mellitus (HCC)   Right Diabetic Foot Ulcer   Right Lower Extremity Cellulitis Left Diabetic Foot Ulcer  - cefepime/vanc/flagyl - ABI's - pending  - Right lower extremity with evidence of cellulitis - MRI R foot with soft tissue ulcer along plantar aspect of second metatarsal head, severe osteomyelitis  of 2nd metatarsal head.  Heterogenous T2 hyperintense signal around the first metatarsal head approximately 8 mm circumferentially concerning for abscess versus phlegmon.  Prior lisfran fracture subluxation with prior ORIF.  Advanced OA of first tarsometatarsal joint secondary to instability with subchondral marrow edema - MRI L foot without evidence of septic arthritis or osteo - Dr. Sharol Given planning for transmetatarsal amputation of R on Wed.  Pt will need protective shoe wear for L foot.  - ESR 30, CRP 5.7  Type 2 diabetes -Maintain on SSI -mealtime insulin ordered -Repeat A1c (6.2) -Hold home medications - BG appropriate  Chronic pain -Maintain on home medications with IV pain medication for breakthrough  Dyslipidemia -Maintain on statin  DVT prophylaxis: lovenox Code Status: full  Family Communication: none at bedside Disposition Plan: pending surgery   Consultants:   Orthopedics   Procedures: none  Antimicrobials:  Anti-infectives (From admission, onward)   Start     Dose/Rate Route Frequency Ordered Stop   06/13/18 2000  ceFEPIme (MAXIPIME) 2 g in sodium chloride 0.9 % 100 mL IVPB     2 g 200 mL/hr over 30 Minutes Intravenous Every 8 hours 06/13/18 1621     06/13/18 1800  vancomycin (VANCOCIN) 1,500 mg in sodium chloride 0.9 % 500 mL IVPB     1,500 mg 250 mL/hr over 120 Minutes Intravenous Every 12 hours 06/13/18 1625     06/13/18 1700  metroNIDAZOLE (FLAGYL) tablet 500 mg     500 mg Oral Every 8 hours 06/13/18 1607  06/12/18 1800  ceFAZolin (ANCEF) IVPB 2g/100 mL premix  Status:  Discontinued     2 g 200 mL/hr over 30 Minutes Intravenous Every 8 hours 06/12/18 1627 06/13/18 1618   06/12/18 1215  ceFAZolin (ANCEF) IVPB 1 g/50 mL premix     1 g 100 mL/hr over 30 Minutes Intravenous  Once 06/12/18 1200 06/12/18 1545     Subjective: Asking for something for athlete's foot Otherwise feels ok  Objective: Vitals:   06/13/18 0542 06/13/18 2013 06/14/18 0457  06/14/18 1328  BP: 125/81 (!) 144/83 125/83 (!) 155/81  Pulse: 88 84 87 88  Resp: _0 Temp: 98.8 F (37.1 C) 98.1 F (36.7 C) 98.1 F (36.7 C) 97.7 F (36.5 C)  TempSrc: Oral Oral Oral Oral  SpO2: 96% 98% 99% 100%  Weight:      Height:        Intake/Output Summary (Last 24 hours) at 06/14/2018 1743 Last data filed at 06/14/2018 1502 Gross per 24 hour  Intake 2944.62 ml  Output 1675 ml  Net 1269.62 ml   Filed Weights   06/12/18 1106  Weight: 83.9 kg    Examination:  General: No acute distress. Cardiovascular: Heart sounds show Brizza Nathanson regular rate, and rhythm Lungs: Clear to auscultation bilaterally Abdomen: Soft, nontender, nondistended  Neurological: Alert and oriented 3. Moves all extremities 4. Cranial nerves II through XII grossly intact. Skin: bilateral plantar ulcers, RLE with erythema and swelling Psychiatric: Mood and affect are normal. Insight and judgment are appropriate.   Data Reviewed: I have personally reviewed following labs and imaging studies  CBC: Recent Labs  Lab 06/12/18 1220 06/13/18 0239 06/14/18 0204  WBC 8.9 6.0 6.9  NEUTROABS 5.6  --   --   HGB 13.5 11.7* 12.0*  HCT 41.5 36.3* 37.1*  MCV 84.2 83.1 83.2  PLT 236 225 607   Basic Metabolic Panel: Recent Labs  Lab 06/12/18 1220 06/13/18 0239 06/14/18 0204  NA 134* 138 139  K 3.8 3.7 3.8  CL 96* 103 105  CO2 _1 GLUCOSE 174* 142* 162*  BUN _2 CREATININE 0.90 0.94 0.81  CALCIUM 9.1 8.6* 8.9  MG  --   --  1.9   GFR: Estimated Creatinine Clearance: 122.3 mL/min (by C-G formula based on SCr of 0.81 mg/dL). Liver Function Tests: Recent Labs  Lab 06/12/18 1220 06/14/18 0204  AST 19 19  ALT 17 14  ALKPHOS 95 75  BILITOT 0.6 0.2*  PROT 7.2 5.4*  ALBUMIN 3.9 2.8*   No results for input(s): LIPASE, AMYLASE in the last 168 hours. No results for input(s): AMMONIA in the last 168 hours. Coagulation Profile: No results for input(s): INR, PROTIME in the last  168 hours. Cardiac Enzymes: No results for input(s): CKTOTAL, CKMB, CKMBINDEX, TROPONINI in the last 168 hours. BNP (last 3 results) No results for input(s): PROBNP in the last 8760 hours. HbA1C: Recent Labs    06/13/18 1649  HGBA1C 6.2*   CBG: Recent Labs  Lab 06/13/18 1718 06/13/18 2015 06/14/18 0721 06/14/18 1204 06/14/18 1621  GLUCAP 139* 150* 131* 93 131*   Lipid Profile: No results for input(s): CHOL, HDL, LDLCALC, TRIG, CHOLHDL, LDLDIRECT in the last 72 hours. Thyroid Function Tests: No results for input(s): TSH, T4TOTAL, FREET4, T3FREE, THYROIDAB in the last 72 hours. Anemia Panel: No results for input(s): VITAMINB12, FOLATE, FERRITIN, TIBC, IRON, RETICCTPCT in the last 72 hours. Sepsis Labs: No results for input(s): PROCALCITON, LATICACIDVEN in the  last 168 hours.  Recent Results (from the past 240 hour(s))  SARS Coronavirus 2 (CEPHEID - Performed in [place] hospital lab), Hosp Order     Status: None   Collection Time: 06/12/18 12:00 PM  Result Value Ref Range Status   SARS Coronavirus 2 NEGATIVE NEGATIVE Final    Comment: (NOTE) If result is NEGATIVE SARS-CoV-2 target nucleic acids are NOT DETECTED. The SARS-CoV-2 RNA is generally detectable in upper and lower  respiratory specimens during the acute phase of infection. The lowest  concentration of SARS-CoV-2 viral copies this assay can detect is 250  copies / mL. [name] negative result does not preclude SARS-CoV-2 infection  and should not be used as the sole basis for treatment or other  patient management decisions.  [name] negative result may occur with  improper specimen collection / handling, submission of specimen other  than nasopharyngeal swab, presence of viral mutation(s) within the  areas targeted by this assay, and inadequate number of viral copies  (<250 copies / mL). [name] negative result must be combined with clinical  observations, patient history, and epidemiological information. If result is  POSITIVE SARS-CoV-2 target nucleic acids are DETECTED. The SARS-CoV-2 RNA is generally detectable in upper and lower  respiratory specimens dur ing the acute phase of infection.  Positive  results are indicative of active infection with SARS-CoV-2.  Clinical  correlation with patient history and other diagnostic information is  necessary to determine patient infection status.  Positive results do  not rule out bacterial infection or co-infection with other viruses. If result is PRESUMPTIVE POSTIVE SARS-CoV-2 nucleic acids MAY BE PRESENT.   [name] presumptive positive result was obtained on the submitted specimen  and confirmed on repeat testing.  While 2019 novel coronavirus  (SARS-CoV-2) nucleic acids may be present in the submitted sample  additional confirmatory testing may be necessary for epidemiological  and / or clinical management purposes  to differentiate between  SARS-CoV-2 and other Sarbecovirus currently known to infect humans.  If clinically indicated additional testing with an alternate test  methodology [phone]) is advised. The SARS-CoV-2 RNA is generally  detectable in upper and lower respiratory sp ecimens during the acute  phase of infection. The expected result is Negative. Fact Sheet for Patients:  StrictlyIdeas.no Fact Sheet for Healthcare Providers: BankingDealers.co.za This test is not yet approved or cleared by the [place] FDA and has been authorized for detection and/or diagnosis of SARS-CoV-2 by FDA under an Emergency Use Authorization (EUA).  This EUA will remain in effect (meaning this test can be used) for the duration of the COVID-19 declaration under Section 564(b)(1) of the Act, 21 U.S.C. section 360bbb-3(b)(1), unless the authorization is terminated or revoked sooner. Performed at [facility], [address] [place], [place] [address]          Radiology Studies: Mr Foot Right Wo  Contrast  Result Date: 06/13/2018 CLINICAL DATA:  Diabetic foot ulcer, chronic pain, prior fourth toe amputation EXAM: MRI OF THE RIGHT FOREFOOT WITHOUT CONTRAST TECHNIQUE: Multiplanar, multisequence MR imaging of the right forefoot was performed. No intravenous contrast was administered. COMPARISON:  None. FINDINGS: Bones/Joint/Cartilage Prior amputation of the second and fourth toes. Soft tissue ulcer along the plantar aspect of the second metatarsal head extending down to the cortical surface of the second metatarsal head. Cortical destruction of the plantar aspect of the second metatarsal head with severe marrow edema throughout the metatarsal head and shaft most consistent with osteomyelitis. Heterogeneous T2 hyperintense signal around the first metatarsal  head measuring approximately 8 mm in thickness circumferentially concerning for abscess versus phlegmon. Prior Lisfranc fracture subluxation with prior ORIF. Advanced osteoarthritis of the first tarsometatarsal joint secondary to instability with subchondral marrow edema. Lateral subluxation of the second metatarsal base relative to the middle cuneiform. Mild osteoarthritis of the second and third TMT joints. No acute fracture or dislocation. Mild osteoarthritis of the first MTP joint. Ligaments Collateral ligaments are intact. Muscles and Tendons Flexor, peroneal and extensor compartment tendons are intact. Muscles are normal. Soft tissue No fluid collection or hematoma. No soft tissue mass. Soft tissue edema along the dorsal aspect of the forefoot. IMPRESSION: 1. Soft tissue ulcer along the plantar aspect of the second metatarsal head. Severe osteomyelitis of the second metatarsal head. Heterogeneous T2 hyperintense signal around the first metatarsal head measuring approximately 8 mm in thickness circumferentially concerning for abscess versus phlegmon. 2. Prior Lisfranc fracture subluxation with prior ORIF. Advanced osteoarthritis of the first  tarsometatarsal joint secondary to instability with subchondral marrow edema. Electronically Signed   By: Kathreen Devoid   On: 06/13/2018 11:36   Mr Foot Left Wo Contrast  Result Date: 06/14/2018 CLINICAL DATA:  Left forefoot diabetic ulcer. EXAM: MRI OF THE LEFT FOOT WITHOUT CONTRAST TECHNIQUE: Multiplanar, multisequence MR imaging of the left forefoot was performed. No intravenous contrast was administered. COMPARISON:  Radiographs 06/08/2018. FINDINGS: Bones/Joint/Cartilage Additional sequences were obtained to minimize artifact from the screw traversing the 1st tarsometatarsal articulation. There is no evidence of acute fracture or dislocation. There is no suspicious bone marrow edema, cortical destruction or erosive change. [name] small effusion of the 1st metatarsophalangeal joint is present. As above, there are postsurgical changes at the 1st tarsal metatarsal articulation. There is no subluxation or significant arthropathy at the Lisfranc joint. The visualized tarsal bones appear normal. Ligaments The Lisfranc ligament is partially obscured by the artifact, although appears intact. The collateral ligaments of the metatarsophalangeal joints appear intact. Muscles and Tendons Severe diffuse muscular fatty atrophy throughout the forefoot. The forefoot tendons are intact. No significant tenosynovitis. Soft tissues There is [name] soft tissue ulcer plantar to the 1st metatarsophalangeal joint which is best seen on the coronal images, measuring up to 12 mm in diameter. Between this ulcer and the flexor hallucis longus tendon, there are signal changes within the subcutaneous fat, manifesting as decreased T1 signal and heterogeneously increased T2 signal, most consistent with phlegmon. There is no well-defined fluid collection to suggest drainable abscess. As above, there is [name] small effusion of the 1st metatarsophalangeal joint, but no evidence of osteomyelitis involving the 1st metatarsal head, it sesamoids or the great  toe. No foreign bodies are identified. IMPRESSION: 1. Soft tissue ulcer plantar to the 1st metatarsophalangeal joint with adjacent phlegmon. No drainable abscess. 2. Small effusion of the 1st metatarsophalangeal joint without specific evidence of septic arthritis or osteomyelitis. 3. Postsurgical changes at the 1st tarsometatarsal articulation. 4. Severe forefoot muscular fatty atrophy. Electronically Signed   By: Richardean Sale M.D.   On: 06/14/2018 09:10        Scheduled Meds:  cholecalciferol  10,000 Units Oral Daily   clotrimazole   Topical BID   enoxaparin (LOVENOX) injection  40 mg Subcutaneous Q24H   insulin aspart  0-5 Units Subcutaneous QHS   insulin aspart  0-9 Units Subcutaneous TID WC   insulin aspart  3 Units Subcutaneous TID WC   metroNIDAZOLE  500 mg Oral Q8H   nicotine  21 mg Transdermal Daily   polyethylene glycol  17 g Oral  BID   QUEtiapine  100 mg Oral QHS   rosuvastatin  20 mg Oral Daily   senna-docusate  2 tablet Oral QHS   vitamin C  500 mg Oral Daily   Continuous Infusions:  ceFEPime (MAXIPIME) IV 2 g (06/14/18 1141)   vancomycin 1,500 mg (06/14/18 1713)     LOS: 2 days    Time spent: over 30 min    Fayrene Helper, MD [facility] Pager AMION  If 7PM-7AM, please contact night-coverage [email or web address] Password [facility] 06/14/2018, 5:43 PM

## 2018-06-14 NOTE — Progress Notes (Signed)
Patient ID: [name], male   DOB: 11-09-1965, 53 y.o.   MRN: [id number] MRI scan right foot confirms osteomyelitis of the second metatarsal.  We will plan for a right transmetatarsal amputation on Wednesday.

## 2018-06-15 ENCOUNTER — Encounter: Payer: Self-pay | Admitting: Family

## 2018-06-15 ENCOUNTER — Encounter (HOSPITAL_COMMUNITY): Payer: Medicare Other

## 2018-06-15 ENCOUNTER — Encounter (HOSPITAL_COMMUNITY): Payer: Self-pay | Admitting: Anesthesiology

## 2018-06-15 ENCOUNTER — Encounter (HOSPITAL_COMMUNITY): Payer: Self-pay | Admitting: *Deleted

## 2018-06-15 ENCOUNTER — Inpatient Hospital Stay (HOSPITAL_COMMUNITY): Payer: Medicare Other | Admitting: Anesthesiology

## 2018-06-15 ENCOUNTER — Encounter (HOSPITAL_COMMUNITY)
Admission: EM | Disposition: A | Discharge status: Home Health | Payer: Self-pay | Source: Home / Self Care | Attending: Internal Medicine

## 2018-06-15 DIAGNOSIS — M86271 Subacute osteomyelitis, right ankle and foot: Secondary | ICD-10-CM

## 2018-06-15 HISTORY — PX: AMPUTATION: SHX166

## 2018-06-15 LAB — CBC
HCT: 36.9 % — ABNORMAL LOW (ref 39.0–52.0)
Hemoglobin: 12.2 g/dL — ABNORMAL LOW (ref 13.0–17.0)
MCH: 27.3 pg (ref 26.0–34.0)
MCHC: 33.1 g/dL (ref 30.0–36.0)
MCV: 82.6 fL (ref 80.0–100.0)
Platelets: 237 10*3/uL (ref 150–400)
RBC: 4.47 MIL/uL (ref 4.22–5.81)
RDW: 13.7 % (ref 11.5–15.5)
WBC: 5.6 10*3/uL (ref 4.0–10.5)
nRBC: 0 % (ref 0.0–0.2)

## 2018-06-15 LAB — COMPREHENSIVE METABOLIC PANEL
ALT: 19 U/L (ref 0–44)
AST: 27 U/L (ref 15–41)
Albumin: 2.9 g/dL — ABNORMAL LOW (ref 3.5–5.0)
Alkaline Phosphatase: 77 U/L (ref 38–126)
Anion gap: 7 (ref 5–15)
BUN: 9 mg/dL (ref 6–20)
CO2: 25 mmol/L (ref 22–32)
Calcium: 9 mg/dL (ref 8.9–10.3)
Chloride: 107 mmol/L (ref 98–111)
Creatinine, Ser: 0.81 mg/dL (ref 0.61–1.24)
GFR calc Af Amer: >60 mL/min (ref >60)
GFR calc non Af Amer: >60 mL/min (ref >60)
Glucose, Bld: 138 mg/dL — ABNORMAL HIGH (ref 70–99)
Potassium: 3.9 mmol/L (ref 3.5–5.1)
Sodium: 139 mmol/L (ref 135–145)
Total Bilirubin: 0.5 mg/dL (ref 0.3–1.2)
Total Protein: 5.6 g/dL — ABNORMAL LOW (ref 6.5–8.1)

## 2018-06-15 LAB — GLUCOSE, CAPILLARY
Glucose-Capillary: 127 mg/dL — ABNORMAL HIGH (ref 70–99)
Glucose-Capillary: 132 mg/dL — ABNORMAL HIGH (ref 70–99)
Glucose-Capillary: 239 mg/dL — ABNORMAL HIGH (ref 70–99)
Glucose-Capillary: 206 mg/dL — ABNORMAL HIGH (ref 70–99)
Glucose-Capillary: 279 mg/dL — ABNORMAL HIGH (ref 70–99)

## 2018-06-15 LAB — MAGNESIUM: Magnesium: 1.9 mg/dL (ref 1.7–2.4)

## 2018-06-15 SURGERY — AMPUTATION, FOOT, PARTIAL
Anesthesia: General | Site: Foot | Laterality: Right

## 2018-06-15 MED ORDER — SODIUM CHLORIDE 0.9 % IV SOLN: INTRAVENOUS | Status: DC

## 2018-06-15 MED ORDER — OXYCODONE HCL 5 MG PO TABS
5.0000 mg | ORAL_TABLET | ORAL | Status: DC | PRN
  Administered 2018-06-15 – 2018-06-16 (×4): 10 mg via ORAL
  Filled 2018-06-15 (×3): qty 2

## 2018-06-15 MED ORDER — LACTATED RINGERS IV SOLN
INTRAVENOUS | Status: DC
  Administered 2018-06-15: 11:00:00 via INTRAVENOUS

## 2018-06-15 MED ORDER — OXYCODONE HCL 5 MG PO TABS
ORAL_TABLET | ORAL | Status: AC
  Filled 2018-06-15: qty 1

## 2018-06-15 MED ORDER — FENTANYL CITRATE (PF) 250 MCG/5ML IJ SOLN
INTRAMUSCULAR | Status: AC
  Filled 2018-06-15: qty 5

## 2018-06-15 MED ORDER — LIDOCAINE HCL (CARDIAC) PF 100 MG/5ML IV SOSY
PREFILLED_SYRINGE | INTRAVENOUS | Status: DC | PRN
  Administered 2018-06-15: 60 mg via INTRAVENOUS

## 2018-06-15 MED ORDER — PHENYLEPHRINE HCL (PRESSORS) 10 MG/ML IV SOLN
INTRAVENOUS | Status: DC | PRN
  Administered 2018-06-15: 80 ug via INTRAVENOUS

## 2018-06-15 MED ORDER — METHOCARBAMOL 1000 MG/10ML IJ SOLN
500.0000 mg | Freq: Four times a day (QID) | INTRAVENOUS | Status: DC | PRN
  Filled 2018-06-15: qty 5

## 2018-06-15 MED ORDER — OXYCODONE HCL 5 MG PO TABS
5.0000 mg | ORAL_TABLET | Freq: Once | ORAL | Status: AC | PRN
  Administered 2018-06-15: 5 mg via ORAL

## 2018-06-15 MED ORDER — METOCLOPRAMIDE HCL 5 MG PO TABS: 5.0000 mg | ORAL_TABLET | Freq: Three times a day (TID) | ORAL | Status: DC | PRN

## 2018-06-15 MED ORDER — MAGNESIUM CITRATE PO SOLN
1.0000 | Freq: Once | ORAL | Status: AC | PRN
  Administered 2018-06-16: 1 via ORAL
  Filled 2018-06-15: qty 296

## 2018-06-15 MED ORDER — OXYCODONE HCL 5 MG/5ML PO SOLN: 5.0000 mg | Freq: Once | ORAL | Status: AC | PRN

## 2018-06-15 MED ORDER — OXYCODONE HCL 5 MG PO TABS
10.0000 mg | ORAL_TABLET | ORAL | Status: DC | PRN
  Administered 2018-06-16 – 2018-06-17 (×3): 15 mg via ORAL
  Filled 2018-06-15 (×2): qty 3
  Filled 2018-06-15: qty 2
  Filled 2018-06-15: qty 3

## 2018-06-15 MED ORDER — ACETAMINOPHEN 500 MG PO TABS: 1000.0000 mg | ORAL_TABLET | Freq: Once | ORAL | Status: DC | PRN

## 2018-06-15 MED ORDER — 0.9 % SODIUM CHLORIDE (POUR BTL) OPTIME
TOPICAL | Status: DC | PRN
  Administered 2018-06-15: 1000 mL

## 2018-06-15 MED ORDER — ACETAMINOPHEN 10 MG/ML IV SOLN: 1000.0000 mg | Freq: Once | INTRAVENOUS | Status: DC | PRN

## 2018-06-15 MED ORDER — ACETAMINOPHEN 325 MG PO TABS: 325.0000 mg | ORAL_TABLET | Freq: Four times a day (QID) | ORAL | Status: DC | PRN

## 2018-06-15 MED ORDER — HYDROMORPHONE HCL 1 MG/ML IJ SOLN
0.5000 mg | INTRAMUSCULAR | Status: DC | PRN
  Administered 2018-06-15 – 2018-06-16 (×9): 1 mg via INTRAVENOUS
  Administered 2018-06-17: 0.5 mg via INTRAVENOUS
  Administered 2018-06-17: 1 mg via INTRAVENOUS
  Filled 2018-06-15 (×10): qty 1

## 2018-06-15 MED ORDER — ACETAMINOPHEN 160 MG/5ML PO SOLN: 1000.0000 mg | Freq: Once | ORAL | Status: DC | PRN

## 2018-06-15 MED ORDER — DEXAMETHASONE SODIUM PHOSPHATE 4 MG/ML IJ SOLN
INTRAMUSCULAR | Status: DC | PRN
  Administered 2018-06-15: 5 mg via INTRAVENOUS

## 2018-06-15 MED ORDER — BISACODYL 10 MG RE SUPP
10.0000 mg | Freq: Every day | RECTAL | Status: DC | PRN
  Administered 2018-06-17: 09:00:00 10 mg via RECTAL
  Filled 2018-06-15: qty 1

## 2018-06-15 MED ORDER — FENTANYL CITRATE (PF) 100 MCG/2ML IJ SOLN: 25.0000 ug | INTRAMUSCULAR | Status: DC | PRN

## 2018-06-15 MED ORDER — METHOCARBAMOL 500 MG PO TABS: 500.0000 mg | ORAL_TABLET | Freq: Four times a day (QID) | ORAL | Status: DC | PRN

## 2018-06-15 MED ORDER — MIDAZOLAM HCL 2 MG/2ML IJ SOLN
INTRAMUSCULAR | Status: AC
  Filled 2018-06-15: qty 2

## 2018-06-15 MED ORDER — MIDAZOLAM HCL 2 MG/2ML IJ SOLN
INTRAMUSCULAR | Status: DC | PRN
  Administered 2018-06-15: 2 mg via INTRAVENOUS

## 2018-06-15 MED ORDER — METOCLOPRAMIDE HCL 5 MG/ML IJ SOLN: 5.0000 mg | Freq: Three times a day (TID) | INTRAMUSCULAR | Status: DC | PRN

## 2018-06-15 MED ORDER — ONDANSETRON HCL 4 MG PO TABS: 4.0000 mg | ORAL_TABLET | Freq: Four times a day (QID) | ORAL | Status: DC | PRN

## 2018-06-15 MED ORDER — DOCUSATE SODIUM 100 MG PO CAPS
100.0000 mg | ORAL_CAPSULE | Freq: Two times a day (BID) | ORAL | Status: DC
  Administered 2018-06-15 – 2018-06-16 (×3): 100 mg via ORAL
  Filled 2018-06-15 (×3): qty 1

## 2018-06-15 MED ORDER — POLYETHYLENE GLYCOL 3350 17 G PO PACK: 17.0000 g | PACK | Freq: Every day | ORAL | Status: DC | PRN

## 2018-06-15 MED ORDER — ONDANSETRON HCL 4 MG/2ML IJ SOLN: 4.0000 mg | Freq: Four times a day (QID) | INTRAMUSCULAR | Status: DC | PRN

## 2018-06-15 MED ORDER — PROPOFOL 10 MG/ML IV BOLUS
INTRAVENOUS | Status: AC
  Filled 2018-06-15: qty 20

## 2018-06-15 MED ORDER — PROPOFOL 10 MG/ML IV BOLUS
INTRAVENOUS | Status: DC | PRN
  Administered 2018-06-15: 140 mg via INTRAVENOUS
  Administered 2018-06-15: 20 mg via INTRAVENOUS

## 2018-06-15 SURGICAL SUPPLY — 37 items
APL SKNCLS STERI-STRIP NONHPOA (GAUZE/BANDAGES/DRESSINGS) ×1
BENZOIN TINCTURE PRP APPL 2/3 (GAUZE/BANDAGES/DRESSINGS) ×3 IMPLANT
BLADE SAW SGTL HD 18.5X60.5X1. (BLADE) ×1 IMPLANT
BLADE SURG 21 STRL SS (BLADE) ×3 IMPLANT
BNDG COHESIVE 4X5 TAN STRL (GAUZE/BANDAGES/DRESSINGS) ×2 IMPLANT
BNDG GAUZE ELAST 4 BULKY (GAUZE/BANDAGES/DRESSINGS) IMPLANT
CANISTER WOUNDNEG PRESSURE 500 (CANNISTER) ×2 IMPLANT
COVER SURGICAL LIGHT HANDLE (MISCELLANEOUS) ×1 IMPLANT
COVER WAND RF STERILE (DRAPES) ×1 IMPLANT
DRAPE INCISE IOBAN 66X45 STRL (DRAPES) ×1 IMPLANT
DRAPE U-SHAPE 47X51 STRL (DRAPES) ×3 IMPLANT
DRESSING PREVENA PLUS CUSTOM (GAUZE/BANDAGES/DRESSINGS) IMPLANT
DRSG ADAPTIC 3X8 NADH LF (GAUZE/BANDAGES/DRESSINGS) IMPLANT
DRSG PAD ABDOMINAL 8X10 ST (GAUZE/BANDAGES/DRESSINGS) IMPLANT
DRSG PREVENA PLUS CUSTOM (GAUZE/BANDAGES/DRESSINGS) ×6
DURAPREP 26ML APPLICATOR (WOUND CARE) ×3 IMPLANT
ELECT REM PT RETURN 9FT ADLT (ELECTROSURGICAL) ×3
ELECTRODE REM PT RTRN 9FT ADLT (ELECTROSURGICAL) ×1 IMPLANT
GAUZE SPONGE 4X4 12PLY STRL (GAUZE/BANDAGES/DRESSINGS) IMPLANT
GLOVE BIOGEL PI IND STRL 9 (GLOVE) ×1 IMPLANT
GLOVE BIOGEL PI INDICATOR 9 (GLOVE) ×2
GLOVE SURG ORTHO 9.0 STRL STRW (GLOVE) ×3 IMPLANT
GOWN STRL REUS W/ TWL XL LVL3 (GOWN DISPOSABLE) ×3 IMPLANT
GOWN STRL REUS W/TWL XL LVL3 (GOWN DISPOSABLE) ×3
KIT BASIN OR (CUSTOM PROCEDURE TRAY) ×3 IMPLANT
KIT TURNOVER KIT B (KITS) ×3 IMPLANT
NS IRRIG 1000ML POUR BTL (IV SOLUTION) ×3 IMPLANT
PACK ORTHO EXTREMITY (CUSTOM PROCEDURE TRAY) ×3 IMPLANT
PAD ARMBOARD 7.5X6 YLW CONV (MISCELLANEOUS) ×4 IMPLANT
SPONGE LAP 18X18 RF (DISPOSABLE) IMPLANT
SUT ETHILON 2 0 PSLX (SUTURE) ×6 IMPLANT
TOWEL OR 17X24 6PK STRL BLUE (TOWEL DISPOSABLE) ×3 IMPLANT
TOWEL OR 17X26 10 PK STRL BLUE (TOWEL DISPOSABLE) ×3 IMPLANT
TUBE CONNECTING 12'X1/4 (SUCTIONS) ×1
TUBE CONNECTING 12X1/4 (SUCTIONS) ×2 IMPLANT
WATER STERILE IRR 1000ML POUR (IV SOLUTION) ×3 IMPLANT
YANKAUER SUCT BULB TIP NO VENT (SUCTIONS) ×3 IMPLANT

## 2018-06-15 NOTE — Progress Notes (Signed)
Orthopedic Tech Progress Note Patient Details:  [name] 1965-04-11 [id number]  Ortho Devices Type of Ortho Device: Postop shoe/boot Ortho Device/Splint Location: right Ortho Device/Splint Interventions: Application   Post Interventions Patient Tolerated: Well Instructions Provided: Care of device   [name] 06/15/2018, 2:02 PM

## 2018-06-15 NOTE — Progress Notes (Signed)
Office Visit Note   Patient: [name]           Date of Birth: 09/23/1965           MRN: [id number] Visit Date: 06/08/2018              Requested by: Lucia Gaskins, MD [address] [place], [place] [address] PCP: Lucia Gaskins, MD  Chief Complaint  Patient presents with   Left Foot - Follow-up   Right Foot - Follow-up      HPI: Patient is a 53 year old gentleman with diabetic insensate neuropathy status post fourth toe amputation the right foot who presents in follow-up for left foot ulcer and ulcer to right foot beneath the 3rd metatarsal head. Denies increase in his activities. Both foot ulcers are painful. Denies any swelling. Reports reduction in swelling and drainage. No systemic symptoms. Has been unable to keep weight off the foot.   Has been doing silvadene dressings in regular shoe wear with custom orthotics.  Assessment & Plan: Visit Diagnoses:  1. Ulcer of left foot, limited to breakdown of skin ([place])   2. Non-pressure chronic ulcer of other part of right foot limited to breakdown of skin ([place])     Plan: Radiographs were reassuring. Mild improvement in wound status will continue with daily wound cleansing. Silvadene dressings. Encouraged to offload ulcers. Discussed return precautions.  Reevaluate in 2 weeks.  Follow-Up Instructions: No follow-ups on file.   Ortho Exam  Patient is alert, oriented, no adenopathy, well-dressed, normal affect, normal respiratory effort. On examination patient has a good dorsalis pedis pulse he has dorsiflexion to neutral.  He does have a plantarflexed first ray bilaterally. Left foot has ulceration and callus with 5 mm of diameter of granulation centrally. After informed consent a 10 blade knife was used to debride the skin and soft tissue back to healthy viable granulation tissue this was touched with silver nitrate.  The ulcer is 3 cm in diameter and 5 mm deep after debridement. Fissure through center of ulcer nearly  healed, dry with 1 mm of depth. There is no exposed bone or tendon. The right foot with ulcer beneath the 3rd MT head. This is 2 cm in diameter with a central proud granulation. This is 5 mm in diameter, probes 3 mm deep. Does not probe to bone. No purulence or surrounding erythema. Is tender. No sign of ascending cellulitis.  Imaging: No results found. No images are attached to the encounter.  Labs: Lab Results  Component Value Date   HGBA1C 6.2 (H) 06/13/2018   HGBA1C 6.7 (H) 06/07/2017   HGBA1C 10.3 (H) 08/17/2016   ESRSEDRATE 30 (H) 06/13/2018   ESRSEDRATE 5 06/04/2017   ESRSEDRATE 110 (H) 12/19/2013   CRP 5.7 (H) 06/13/2018   CRP <0.8 06/05/2017   CRP 12.2 (H) 12/19/2013   REPTSTATUS 12/24/2013 FINAL 12/18/2013   REPTSTATUS 12/24/2013 FINAL 12/18/2013   GRAMSTAIN  04/19/2011    WBC PRESENT,BOTH PMN AND MONONUCLEAR NO ORGANISMS SEEN   CULT  12/18/2013    NO GROWTH 5 DAYS Performed at [place]  12/18/2013    NO GROWTH 5 DAYS Performed at [facility]      Lab Results  Component Value Date   ALBUMIN 2.9 (L) 06/15/2018   ALBUMIN 2.8 (L) 06/14/2018   ALBUMIN 3.9 06/12/2018    Body mass index is 22.5 kg/m.  Orders:  Orders Placed This Encounter  Procedures   XR Foot 2 Views  Left   XR Foot 2 Views Right   No orders of the defined types were placed in this encounter.    Procedures: No procedures performed  Clinical Data: No additional findings.  ROS:  All other systems negative, except as noted in the HPI. Review of Systems  Constitutional: Negative for chills and fever.  Skin: Positive for wound. Negative for color change.    Objective: Vital Signs: Ht 6\' 3"  (1.905 m)    Wt 180 lb (81.6 kg)    BMI 22.50 kg/m   Specialty Comments:  No specialty comments available.  PMFS History: Patient Active Problem List   Diagnosis Date Noted   Osteomyelitis of right foot ([place])    Cellulitis of right foot    Charcot foot  due to diabetes mellitus ([place])    Cellulitis 06/12/2018   H/O amputation of lesser toe, right ([place]) 07/29/2017   Diabetic foot infection ([place]) 06/22/2017   Post op infection 06/22/2017   SVT (supraventricular tachycardia) ([place]) 08/17/2016   Tobacco use disorder 10/25/2014   Diabetes mellitus type 2 in nonobese [facility])    Swelling    Type 2 diabetes mellitus with left diabetic foot ulcer ([place]) 12/18/2013   Diabetes mellitus due to underlying condition with foot ulcer ([place]) 12/18/2013   Left leg cellulitis 12/18/2013   Cocaine abuse ([place]) 03/29/2012   Generalized anxiety disorder 03/29/2012   Panic attacks 03/29/2012   Alcohol dependence ([place]) 08/05/2011    Class: Chronic   Substance abuse ([place]) 05/31/2011   Ulcer of left foot, limited to breakdown of skin ([place]) 02/15/2011   Neuropathic ulcer of foot ([place]) 02/15/2011   Past Medical History:  Diagnosis Date   Anxiety    Arthritis    "everywhere" (04/21/2012)   Cellulitis and abscess of foot 12/19/2013   LEFT FOOT   Chronic pain    DDD (degenerative disc disease)    Depression    Diabetes mellitus without complication (HCC)    borderline   Diabetic foot ulcer (HCC)    ETOH abuse    Full dentures    GERD (gastroesophageal reflux disease)    tums   History of blood transfusion    "related to left knee OR; probably right hip too" (04/21/2012)   Mental disorder    Neuromuscular disorder ([place])    neuropathy   Noncompliance    Open wound    bottom of foot   Osteomyelitis ([place])    right 4th toe   Pneumonia ~ 2012   Polysubstance abuse ([place])    etoh, cocaine, heroin   Stroke [facility]) 2008   "they said I might have had one during right hip replacement" (04/21/2012)   Wears glasses     Family History  Problem Relation Age of Onset   Diabetes Father     Past Surgical History:  Procedure Laterality Date   AMPUTATION Right 06/09/2017   Procedure: RIGHT 2ND TOE AMPUTATION;  Surgeon: [name], MD;  Location: [place];  Service: Orthopedics;  Laterality: Right;   AMPUTATION Right 07/23/2017   Procedure: RIGHT 4TH TOE AMPUTATION;  Surgeon: [name], MD;  Location: [place];  Service: Orthopedics;  Laterality: Right;   JOINT REPLACEMENT     KNEE ARTHROSCOPY Bilateral 1980's/1990's   LUNG LOBECTOMY Left ~ 2006   LUNG LOBECTOMY     METATARSAL OSTEOTOMY  10/29/2011   Procedure: METATARSAL OSTEOTOMY;  Surgeon: [name], MD;  Location: [place];  Service: Orthopedics;  Laterality: Left;  Left 1st  Metatarsal Dorsal Closing Wedge    METATARSAL OSTEOTOMY Right 04/28/2017   Procedure: RIGHT 1ST METATARSAL DORSAL CLOSING WEDGE OSTEOTOMY;  Surgeon: [name], MD;  Location: [place];  Service: Orthopedics;  Laterality: Right;   MULTIPLE TOOTH EXTRACTIONS     REVISION TOTAL HIP ARTHROPLASTY Right 2008   "4-5 months after replacement" (04/21/2012)   TOTAL HIP ARTHROPLASTY Right 2008   TOTAL KNEE ARTHROPLASTY Left 2006   TOTAL KNEE ARTHROPLASTY Right 04/20/2012   TOTAL KNEE ARTHROPLASTY Right 04/20/2012   Procedure: TOTAL KNEE ARTHROPLASTY;  Surgeon: [name], MD;  Location: [place];  Service: Orthopedics;  Laterality: Right;  Right Total Knee Arthroplasty   Social History   Occupational History   Not on file  Tobacco Use   Smoking status: Current Every Day Smoker    Packs/day: 1.00    Years: 30.00    Pack years: 30.00    Types: Cigarettes   Smokeless tobacco: Never Used  Substance and Sexual Activity   Alcohol use: Not Currently    Alcohol/week: 0.0 standard drinks    Frequency: Never   Drug use: Not Currently    Types: Cocaine    Comment: Cocaine and heroin - 10 years ago   Sexual activity: Yes    Birth control/protection: None

## 2018-06-15 NOTE — Plan of Care (Signed)
  Problem: Pain Managment: Goal: General experience of comfort will improve Outcome: Progressing   Problem: Safety: Goal: Ability to remain free from injury will improve Outcome: Progressing   Problem: Skin Integrity: Goal: Risk for impaired skin integrity will decrease Outcome: Progressing   

## 2018-06-15 NOTE — Care Management Important Message (Signed)
Important Message  Patient Details  Name: [name] MRN: [id number] Date of Birth: Jan 07, 1966   Medicare Important Message Given:  Yes    Orbie Pyo 06/15/2018, 2:49 PM

## 2018-06-15 NOTE — Social Work (Signed)
CSW acknowledging consult for SNF placement. Will follow for therapy recommendations.   Avett Reineck, MSW, LCSWA [place] Clinical Social Work [phone]   

## 2018-06-15 NOTE — Evaluation (Signed)
Physical Therapy Evaluation Patient Details Name: [name] MRN: [id number] DOB: 1965-03-09 Today's Date: 06/15/2018   History of Present Illness  Pt is a 53 y.o. male admitted 06/12/18 with R foot osteomyelitis. S/p R foot transmet amputation 5/27. PMH includes DM, bilateral foot ulcers, neuropathy, chronic pain, previous R 4th toe amputation.    Clinical Impression  Pt presents with an overall decrease in functional mobility secondary to above. PTA, pt indep and lives with son. Educ on precautions, positioning, therex, and importance of mobility. Today, pt with difficulty maintaining RLE TDWB while standing; max education on importance of precautions. Pt declining gait training secondary to pain/fatigue. Pt would benefit from continued acute PT services to maximize functional mobility and independence prior to d/c with HHPT services.     Follow Up Recommendations Home health PT;Supervision - Intermittent    Equipment Recommendations  None recommended by PT    Recommendations for Other Services       Precautions / Restrictions Precautions Precautions: Fall Restrictions Weight Bearing Restrictions: Yes RLE Weight Bearing: Touchdown weight bearing      Mobility  Bed Mobility Overal bed mobility: Modified Independent             General bed mobility comments: HOB elevated  Transfers Overall transfer level: Needs assistance Equipment used: None Transfers: Sit to/from Stand Sit to Stand: Supervision         General transfer comment: Pt received standing at EOB using urinal, WB through RLE; max education on importance of RLE TDWB, pt able to offload better with use of RW. C/o dizziness when standing  Ambulation/Gait             General Gait Details: Declined secondary to pain and fatigue  Stairs            Wheelchair Mobility    Modified Rankin (Stroke Patients Only)       Balance Overall balance assessment: Needs assistance   Sitting  balance-Leahy Scale: Good       Standing balance-Leahy Scale: Fair Standing balance comment: Can static stand without UE support but WB through RLE; reliant on UE support to maintain precautions                             Pertinent Vitals/Pain Pain Assessment: Faces Faces Pain Scale: Hurts little more Pain Location: R foot Pain Descriptors / Indicators: Sore Pain Intervention(s): Limited activity within patient's tolerance;Premedicated before session;Repositioned    Home Living Family/patient expects to be discharged to:: Private residence Living Arrangements: Alone Available Help at Discharge: Family;Available PRN/intermittently Type of Home: Mobile home Home Access: Stairs to enter Entrance Stairs-Rails: Right;Left;Can reach both Entrance Stairs-Number of Steps: 4 Home Layout: One level Home Equipment: Walker - 2 wheels;Cane - single point;Bedside commode;Crutches;Wheelchair - manual      Prior Function Level of Independence: Independent         Comments: Used SPC for ambulation s/p toe amputation     Hand Dominance   Dominant Hand: Right    Extremity/Trunk Assessment   Upper Extremity Assessment Upper Extremity Assessment: Overall WFL for tasks assessed    Lower Extremity Assessment Lower Extremity Assessment: RLE deficits/detail RLE Deficits / Details: s/p R transmet amputation; hip/knee functionally at least 3/5 throughout RLE: Unable to fully assess due to immobilization RLE Coordination: decreased gross motor       Communication   Communication: No difficulties  Cognition Arousal/Alertness: Awake/alert Behavior During Therapy: WFL for tasks assessed/performed  Overall Cognitive Status: Within Functional Limits for tasks assessed                                        General Comments      Exercises     Assessment/Plan    PT Assessment Patient needs continued PT services  PT Problem List Decreased  strength;Decreased range of motion;Decreased activity tolerance;Decreased balance;Decreased mobility;Decreased knowledge of use of DME;Decreased knowledge of precautions;Pain       PT Treatment Interventions DME instruction;Gait training;Stair training;Functional mobility training;Therapeutic activities;Therapeutic exercise;Balance training;Patient/family education    PT Goals (Current goals can be found in the Care Plan section)  Acute Rehab PT Goals Patient Stated Goal: Return home with HHPT & HHRN PT Goal Formulation: With patient Time For Goal Achievement: 06/29/18 Potential to Achieve Goals: Good    Frequency Min 3X/week   Barriers to discharge Decreased caregiver support      Co-evaluation               AM-PAC PT "6 Clicks" Mobility  Outcome Measure Help needed turning from your back to your side while in a flat bed without using bedrails?: None Help needed moving from lying on your back to sitting on the side of a flat bed without using bedrails?: None Help needed moving to and from a bed to a chair (including a wheelchair)?: A Little Help needed standing up from a chair using your arms (e.g., wheelchair or bedside chair)?: A Little Help needed to walk in hospital room?: A Little Help needed climbing 3-5 steps with a railing? : A Little 6 Click Score: 20    End of Session   Activity Tolerance: Patient limited by fatigue;Patient limited by pain Patient left: in bed;with call bell/phone within reach Nurse Communication: Mobility status PT Visit Diagnosis: Other abnormalities of gait and mobility (R26.89);Pain Pain - Right/Left: Right Pain - part of body: Ankle and joints of foot    Time: 1451-1506 PT Time Calculation (min) (ACUTE ONLY): 15 min   Charges:   PT Evaluation $PT Eval Moderate Complexity: [place], PT, DPT Acute Rehabilitation Services  Pager [phone] Office [place] 06/15/2018, 4:05 PM

## 2018-06-15 NOTE — Anesthesia Preprocedure Evaluation (Signed)
Anesthesia Evaluation  Patient identified by MRN, date of birth, ID band Patient awake    Reviewed: Allergy & Precautions, NPO status , Patient's Chart, lab work & pertinent test results  Airway Mallampati: II  TM Distance: >3 FB Neck ROM: Full    Dental  (+) Edentulous Upper, Edentulous Lower   Pulmonary neg shortness of breath, neg recent URI, Current Smoker,    breath sounds clear to auscultation       Cardiovascular negative cardio ROS   Rhythm:Regular     Neuro/Psych Anxiety Depression  Neuromuscular disease CVA    GI/Hepatic Neg liver ROS, GERD  ,  Endo/Other  diabetes  Renal/GU      Musculoskeletal  (+) Arthritis ,   Abdominal   Peds  Hematology  (+) anemia ,   Anesthesia Other Findings   Reproductive/Obstetrics                             Anesthesia Physical Anesthesia Plan  ASA: III  Anesthesia Plan: General   Post-op Pain Management:    Induction: Intravenous  PONV Risk Score and Plan: 1 and Ondansetron  Airway Management Planned: LMA  Additional Equipment: None  Intra-op Plan:   Post-operative Plan: Extubation in OR  Informed Consent: I have reviewed the patients History and Physical, chart, labs and discussed the procedure including the risks, benefits and alternatives for the proposed anesthesia with the patient or authorized representative who has indicated his/her understanding and acceptance.     Dental advisory given  Plan Discussed with: CRNA and Surgeon  Anesthesia Plan Comments:         Anesthesia Quick Evaluation

## 2018-06-15 NOTE — Progress Notes (Addendum)
PROGRESS NOTE  [name] JGO:[id number] DOB: 1965/05/16 DOA: 06/12/2018 PCP: Lucia Gaskins, MD  HPI/Recap of past 24 hours: [name] a 53 y.o.malewith medical history significant fordiabetes and diabetic foot ulcers, diabetic neuropathy, chronic pain, and prior fourth toe amputation of right foot who presented with worsening erythema and warmth of his right foot and leg over the last 2 days. He denies any fevers or chills or other symptomatology. No drainage noted from foot ulcers.He was noted to have recent treatment on oral doxycycline for 2 weeks that he has completed and appears to have failed outpatient treatment.He has minimal edema noted over the dorsum of his right foot as well. He has last seen his orthopedist Dr. Sharol Given approximately 1 week ago he did not have any issues at that time.  ED Course:He has been started on some IV fluid as well as Ancef and given some Dilaudid for pain control. Right foot x-ray does appear to demonstrate some osteomyelitis. Laboratory data otherwise unremarkable with no signs of sepsis noted. Discussed case with Dr. Sharol Given and initially felt that he could be discharged until information about his recent oral antibiotic use came to light. He now recommends IV antibiotics and admission to [facility] where he will see patient in a.m. for further evaluation and management.  Imaging with severe osteomyelitis of second metatarsal head on R foot.  Ortho planning for transmetatarsal amputation on 5/27.  06/15/18: Patient seen and examined at his bedside.  No acute events overnight.  Reports he has more pain on his right foot and not so much on the left foot.  Plan for right foot transmetatarsal amputation per orthopedic surgery.  Has no other concerns.   Assessment/Plan: Active Problems:   Cellulitis   Osteomyelitis of right foot (HCC)   Cellulitis of right foot   Charcot foot due to diabetes mellitus (HCC)  Right Diabetic Foot Ulcer    Right Lower Extremity Cellulitis Left Diabetic Foot Ulcer  -Continue cefepime/IV vanc/po flagyl - ABI's - pending  - Right lower extremity with evidence of cellulitis - MRI R foot with soft tissue ulcer along plantar aspect of second metatarsal head, severe osteomyelitis of 2nd metatarsal head.  Heterogenous T2 hyperintense signal around the first metatarsal head approximately 8 mm circumferentially concerning for abscess versus phlegmon.  Prior lisfran fracture subluxation with prior ORIF.  Advanced OA of first tarsometatarsal joint secondary to instability with subchondral marrow edema - MRI L foot without evidence of septic arthritis or osteo - Dr. Sharol Given planning for transmetatarsal amputation of R today.  Pt will need protective shoe wear for L foot.  - ESR 30, CRP 5.7 -Pain management in place on IV Dilaudid as needed for severe pain and Percocet for moderate pain. -Continue GI regimen for constipation prophylaxis  Type 2 diabetes -Maintain on SSI -mealtime insulin ordered -Repeat A1c (6.2) -Hold home medications - BG appropriate  Chronic pain -Maintain on home medications with IV pain medication for breakthrough  Dyslipidemia -Maintain on statin  DVT prophylaxis: lovenox Code Status: full  Family Communication: none at bedside Disposition Plan: pending surgery   Consultants:   Orthopedics   Procedures: Planned right transmetatarsal amputation on 06/15/2018     Objective: Vitals:   06/14/18 2101 06/15/18 0345 06/15/18 1011 06/15/18 1040  BP: (!) 158/88 (!) 142/85 (!) 134/95   Pulse: 78 82 69   Resp: 16 20    Temp: 98.3 F (36.8 C) 97.8 F (36.6 C) 98.2 F (36.8 C)   TempSrc: Oral Oral  Oral   SpO2: 98% 98% 100%   Weight:    83.9 kg  Height:    '6\' 3"'$  (1.905 m)    Intake/Output Summary (Last 24 hours) at 06/15/2018 1058 Last data filed at 06/15/2018 0325 Gross per 24 hour  Intake 2317.62 ml  Output 1000 ml  Net 1317.62 ml   Filed Weights   06/12/18  1106 06/15/18 1040  Weight: 83.9 kg 83.9 kg    Exam:   General: 53 y.o. year-old male well developed well nourished in no acute distress.  Alert and oriented x3.  Cardiovascular: Regular rate and rhythm with no rubs or gallops.  No thyromegaly or JVD noted.    Respiratory: Clear to auscultation with no wheezes or rales. Good inspiratory effort.  Abdomen: Soft nontender nondistended with normal bowel sounds x4 quadrants.  Musculoskeletal: No lower extremity edema. 2/4 pulses in all 4 extremities.  Skin: Ulcerative lesions noted at dorsal portion of right foot and left foot.  Right lower extremity erythema and mild swelling.  Psychiatry: Mood is appropriate for condition and setting   Data Reviewed: CBC: Recent Labs  Lab 06/12/18 1220 06/13/18 0239 06/14/18 0204 06/15/18 0533  WBC 8.9 6.0 6.9 5.6  NEUTROABS 5.6  --   --   --   HGB 13.5 11.7* 12.0* 12.2*  HCT 41.5 36.3* 37.1* 36.9*  MCV 84.2 83.1 83.2 82.6  PLT 236 225 241 496   Basic Metabolic Panel: Recent Labs  Lab 06/12/18 1220 06/13/18 0239 06/14/18 0204 06/15/18 0533  NA 134* 138 139 139  K 3.8 3.7 3.8 3.9  CL 96* 103 105 107  CO2 '25 26 26 25  '$ GLUCOSE 174* 142* 162* 138*  BUN '13 14 12 9  '$ CREATININE 0.90 0.94 0.81 0.81  CALCIUM 9.1 8.6* 8.9 9.0  MG  --   --  1.9 1.9   GFR: Estimated Creatinine Clearance: 122.3 mL/min (by C-G formula based on SCr of 0.81 mg/dL). Liver Function Tests: Recent Labs  Lab 06/12/18 1220 06/14/18 0204 06/15/18 0533  AST '19 19 27  '$ ALT '17 14 19  '$ ALKPHOS 95 75 77  BILITOT 0.6 0.2* 0.5  PROT 7.2 5.4* 5.6*  ALBUMIN 3.9 2.8* 2.9*   No results for input(s): LIPASE, AMYLASE in the last 168 hours. No results for input(s): AMMONIA in the last 168 hours. Coagulation Profile: No results for input(s): INR, PROTIME in the last 168 hours. Cardiac Enzymes: No results for input(s): CKTOTAL, CKMB, CKMBINDEX, TROPONINI in the last 168 hours. BNP (last 3 results) No results for  input(s): PROBNP in the last 8760 hours. HbA1C: Recent Labs    06/13/18 1649  HGBA1C 6.2*   CBG: Recent Labs  Lab 06/14/18 1204 06/14/18 1621 06/14/18 2059 06/15/18 0811 06/15/18 1019  GLUCAP 93 131* 132* 127* 132*   Lipid Profile: No results for input(s): CHOL, HDL, LDLCALC, TRIG, CHOLHDL, LDLDIRECT in the last 72 hours. Thyroid Function Tests: No results for input(s): TSH, T4TOTAL, FREET4, T3FREE, THYROIDAB in the last 72 hours. Anemia Panel: No results for input(s): VITAMINB12, FOLATE, FERRITIN, TIBC, IRON, RETICCTPCT in the last 72 hours. Urine analysis:    Component Value Date/Time   COLORURINE STRAW (A) 09/04/2016 0741   APPEARANCEUR CLEAR 09/04/2016 0741   LABSPEC 1.001 (L) 09/04/2016 0741   PHURINE 6.0 09/04/2016 0741   GLUCOSEU >=500 (A) 09/04/2016 0741   HGBUR NEGATIVE 09/04/2016 0741   BILIRUBINUR NEGATIVE 09/04/2016 0741   KETONESUR 5 (A) 09/04/2016 0741   PROTEINUR NEGATIVE 09/04/2016 0741  UROBILINOGEN 0.2 12/18/2013 1402   NITRITE NEGATIVE 09/04/2016 0741   LEUKOCYTESUR NEGATIVE 09/04/2016 0741   Sepsis Labs: '@LABRCNTIP'$ (procalcitonin:4,lacticidven:4)  ) Recent Results (from the past 240 hour(s))  SARS Coronavirus 2 (CEPHEID - Performed in [place] hospital lab), Hosp Order     Status: None   Collection Time: 06/12/18 12:00 PM  Result Value Ref Range Status   SARS Coronavirus 2 NEGATIVE NEGATIVE Final    Comment: (NOTE) If result is NEGATIVE SARS-CoV-2 target nucleic acids are NOT DETECTED. The SARS-CoV-2 RNA is generally detectable in upper and lower  respiratory specimens during the acute phase of infection. The lowest  concentration of SARS-CoV-2 viral copies this assay can detect is 250  copies / mL. A negative result does not preclude SARS-CoV-2 infection  and should not be used as the sole basis for treatment or other  patient management decisions.  A negative result may occur with  improper specimen collection / handling, submission of  specimen other  than nasopharyngeal swab, presence of viral mutation(s) within the  areas targeted by this assay, and inadequate number of viral copies  (<250 copies / mL). A negative result must be combined with clinical  observations, patient history, and epidemiological information. If result is POSITIVE SARS-CoV-2 target nucleic acids are DETECTED. The SARS-CoV-2 RNA is generally detectable in upper and lower  respiratory specimens dur ing the acute phase of infection.  Positive  results are indicative of active infection with SARS-CoV-2.  Clinical  correlation with patient history and other diagnostic information is  necessary to determine patient infection status.  Positive results do  not rule out bacterial infection or co-infection with other viruses. If result is PRESUMPTIVE POSTIVE SARS-CoV-2 nucleic acids MAY BE PRESENT.   A presumptive positive result was obtained on the submitted specimen  and confirmed on repeat testing.  While 2019 novel coronavirus  (SARS-CoV-2) nucleic acids may be present in the submitted sample  additional confirmatory testing may be necessary for epidemiological  and / or clinical management purposes  to differentiate between  SARS-CoV-2 and other Sarbecovirus currently known to infect humans.  If clinically indicated additional testing with an alternate test  methodology [phone]) is advised. The SARS-CoV-2 RNA is generally  detectable in upper and lower respiratory sp ecimens during the acute  phase of infection. The expected result is Negative. Fact Sheet for Patients:  StrictlyIdeas.no Fact Sheet for Healthcare Providers: BankingDealers.co.za This test is not yet approved or cleared by the [place] FDA and has been authorized for detection and/or diagnosis of SARS-CoV-2 by FDA under an Emergency Use Authorization (EUA).  This EUA will remain in effect (meaning this test can be used) for the  duration of the COVID-19 declaration under Section 564(b)(1) of the Act, 21 U.S.C. section 360bbb-3(b)(1), unless the authorization is terminated or revoked sooner. Performed at [facility], [address] [place], [place] [address]   Surgical pcr screen     Status: None   Collection Time: 06/14/18  4:55 PM  Result Value Ref Range Status   MRSA, PCR NEGATIVE NEGATIVE Final   Staphylococcus aureus NEGATIVE NEGATIVE Final    Comment: (NOTE) The Xpert SA Assay (FDA approved for NASAL specimens in patients 21 years of age and older), is one component of a comprehensive surveillance program. It is not intended to diagnose infection nor to guide or monitor treatment. Performed at [facility], [place] [address] [place], [place] [address]       Studies: No results found.  Scheduled Meds:  [MAR Hold] cholecalciferol  10,000 Units Oral Daily   [MAR Hold] clotrimazole   Topical BID   [MAR Hold] enoxaparin (LOVENOX) injection  40 mg Subcutaneous Q24H   [MAR Hold] insulin aspart  0-5 Units Subcutaneous QHS   [MAR Hold] insulin aspart  0-9 Units Subcutaneous TID WC   [MAR Hold] insulin aspart  3 Units Subcutaneous TID WC   [MAR Hold] metroNIDAZOLE  500 mg Oral Q8H   [MAR Hold] nicotine  21 mg Transdermal Daily   [MAR Hold] polyethylene glycol  17 g Oral BID   [MAR Hold] QUEtiapine  100 mg Oral QHS   [MAR Hold] rosuvastatin  20 mg Oral Daily   [MAR Hold] senna-docusate  2 tablet Oral QHS   [MAR Hold] vitamin C  500 mg Oral Daily    Continuous Infusions:   ceFAZolin (ANCEF) IV     [MAR Hold] ceFEPime (MAXIPIME) IV 2 g (06/15/18 0325)   lactated ringers 10 mL/hr at 06/15/18 1047   [MAR Hold] vancomycin 1,500 mg (06/15/18 0602)     LOS: 3 days     Kayleen Memos, MD [facility] Pager [phone]  If 7PM-7AM, please contact night-coverage [email or web address] Password [facility] 06/15/2018, 10:58 AM

## 2018-06-15 NOTE — TOC Initial Note (Addendum)
Transition of Care [facility]) - Initial/Assessment Note    Patient Details  Name: [name] MRN: [id number] Date of Birth: [birth year]  Transition of Care [facility]) CM/SW Contact:    Marilu Favre, RN Phone Number: 06/15/2018, 4:33 PM  Clinical Narrative:                 Confirmed face sheet information with patient. Son lives with patient. Patient has walker and 3 in1 at home already.   Patient requesting [place] and wants same HHPT he had last time if possible. Referral and request for same HHPT given to [facility] with [facility]. Discussed [facility] customizable wound VAC.  Expected Discharge Plan: [place] Barriers to Discharge: Continued Medical Work up   Patient Goals and CMS Choice Patient states their goals for this hospitalization and ongoing recovery are:: to go home  CMS Medicare.gov Compare Post Acute Care list provided to:: Patient Choice offered to / list presented to : Patient  Expected Discharge Plan and Services Expected Discharge Plan: [place]   Discharge Planning Services: CM Consult Post Acute Care Choice: [place] arrangements for the past 2 months: Single Family Home                 DME Arranged: N/A DME Agency: NA       HH Arranged: [place] Agency: [place] Date HH Agency Contacted: 06/15/18 Time [place]: 1632 Representative spoke with at [place]: [place] Arrangements/Services Living arrangements for the past 2 months: [place] with:: Adult Children Patient language and need for interpreter reviewed:: Yes Do you feel safe going back to the place where you live?: Yes      Need for Family Participation in Patient Care: Yes (Comment) Care giver support system in place?: Yes (comment) Current home services: DME Criminal Activity/Legal Involvement Pertinent to Current Situation/Hospitalization: No - Comment as needed  Activities of Daily  Living Home Assistive Devices/Equipment: CBG Meter ADL Screening (condition at time of admission) Patient's cognitive ability adequate to safely complete daily activities?: Yes Is the patient deaf or have difficulty hearing?: No Does the patient have difficulty seeing, even when wearing glasses/contacts?: No Does the patient have difficulty concentrating, remembering, or making decisions?: No Patient able to express need for assistance with ADLs?: Yes Does the patient have difficulty dressing or bathing?: No Independently performs ADLs?: Yes (appropriate for developmental age) Does the patient have difficulty walking or climbing stairs?: No Weakness of Legs: None Weakness of Arms/Hands: None  Permission Sought/Granted Permission sought to share information with : Case Manager Permission granted to share information with : Yes, Verbal Permission Granted     Permission granted to share info w AGENCY: [facility]         Emotional Assessment Appearance:: Appears stated age Attitude/Demeanor/Rapport: Engaged Affect (typically observed): Accepting Orientation: : Oriented to Self, Oriented to Place, Oriented to  Time, Oriented to Situation   Psych Involvement: No (comment)  Admission diagnosis:  Cellulitis of right foot [L03.115] Osteomyelitis of right foot, unspecified type [facility] [place]) [M86.9] Patient Active Problem List   Diagnosis Date Noted  . Osteomyelitis of right foot ([place])   . Cellulitis of right foot   . Charcot foot due to diabetes mellitus ([place])   . Cellulitis 06/12/2018  . H/O amputation of lesser toe, right ([place]) 07/29/2017  . Diabetic foot infection ([place]) 06/22/2017  . Post op infection 06/22/2017  . SVT (  supraventricular tachycardia) ([place]) 08/17/2016  . Tobacco use disorder 10/25/2014  . Diabetes mellitus type 2 in nonobese (HCC)   . Swelling   . Type 2 diabetes mellitus with left diabetic foot ulcer ([place]) 12/18/2013  . Diabetes mellitus due to underlying  condition with foot ulcer ([place]) 12/18/2013  . Left leg cellulitis 12/18/2013  . Cocaine abuse ([place]) 03/29/2012  . Generalized anxiety disorder 03/29/2012  . Panic attacks 03/29/2012  . Alcohol dependence ([place]) 08/05/2011    Class: Chronic  . Substance abuse ([place]) 05/31/2011  . Ulcer of left foot, limited to breakdown of skin ([place]) 02/15/2011  . Neuropathic ulcer of foot ([place]) 02/15/2011   PCP:  Lucia Gaskins, MD Pharmacy:   [facility], [address] AT [place] [place] [address] Phone: [phone] Fax: [phone]     Social Determinants of Health (SDOH) Interventions    Readmission Risk Interventions No flowsheet data found.

## 2018-06-15 NOTE — Transfer of Care (Signed)
Immediate Anesthesia Transfer of Care Note  Patient: [name]  Procedure(s) Performed: RIGHT FOOT TRANSMETATARSAL AMPUTATION (Right Foot)  Patient Location: PACU  Anesthesia Type:General  Level of Consciousness: awake, alert , oriented and patient cooperative  Airway & Oxygen Therapy: Patient Spontanous Breathing and Patient connected to face mask oxygen  Post-op Assessment: Report given to RN and Post -op Vital signs reviewed and stable  Post vital signs: Reviewed and stable  Last Vitals:  Vitals Value Taken Time  BP 153/87 06/15/2018 12:55 PM  Temp 36.6 C 06/15/2018 12:55 PM  Pulse 80 06/15/2018 12:55 PM  Resp 18 06/15/2018 12:55 PM  SpO2 97 % 06/15/2018 12:55 PM    Last Pain:  Vitals:   06/15/18 1255  TempSrc: Oral  PainSc:       Patients Stated Pain Goal: 0 (95/09/32 6712)  Complications: No apparent anesthesia complications

## 2018-06-15 NOTE — Op Note (Signed)
06/15/2018  11:58 AM  PATIENT:  [name]    PRE-OPERATIVE DIAGNOSIS:  osteomyelitis right foot  POST-OPERATIVE DIAGNOSIS:  Same  PROCEDURE:  RIGHT FOOT TRANSMETATARSAL AMPUTATION  SURGEON:  [name], MD  PHYSICIAN ASSISTANT:None ANESTHESIA:   General  PREOPERATIVE INDICATIONS:  [name] is a  53 y.o. male with a diagnosis of osteomyelitis right foot who failed conservative measures and elected for surgical management.    The risks benefits and alternatives were discussed with the patient preoperatively including but not limited to the risks of infection, bleeding, nerve injury, cardiopulmonary complications, the need for revision surgery, among others, and the patient was willing to proceed.  OPERATIVE IMPLANTS: Praveena customizable wound VAC.  @ENCIMAGES @  OPERATIVE FINDINGS: Good petechial bleeding at the amputation site no abscess.  OPERATIVE PROCEDURE: Patient was brought the operating room underwent a general anesthetic.  After adequate levels anesthesia were obtained patient's right lower extremity was prepped using DuraPrep draped into a sterile field a timeout was called.  A fishmouth incision was just made distal to the metatarsal heads.  A transmetatarsal amputation was performed through the metatarsal necks this was beveled plantarly with a gentle cascade of an arch.  Electrocautery was used hemostasis the wound was irrigated with normal saline.  The incision was closed using 2-0 nylon.  A Praveena wound VAC was applied this had a good suction fit patient was extubated taken to PACU in stable condition.   DISCHARGE PLANNING:  Antibiotic duration: Continue antibiotics for 24 hours postoperatively  Weightbearing: Touchdown weightbearing on the right  Pain medication: Opioid pathway ordered  Dressing care/ Wound VAC: Continue wound VAC for 1 week  Ambulatory devices: Walker.  Discharge to: Anticipate discharge to home when safe with therapy.  Follow-up:  In the office 1 week post operative.

## 2018-06-15 NOTE — Interval H&P Note (Signed)
History and Physical Interval Note:  06/15/2018 7:12 AM  [name]  has presented today for surgery, with the diagnosis of osteomyelitis right foot.  The various methods of treatment have been discussed with the patient and family. After consideration of risks, benefits and other options for treatment, the patient has consented to  Procedure(s): RIGHT FOOT TRANSMETATARSAL AMPUTATION (Right) as a surgical intervention.  The patient's history has been reviewed, patient examined, no change in status, stable for surgery.  I have reviewed the patient's chart and labs.  Questions were answered to the patient's satisfaction.     [name]

## 2018-06-16 ENCOUNTER — Encounter (HOSPITAL_COMMUNITY): Payer: Medicare Other

## 2018-06-16 ENCOUNTER — Encounter (HOSPITAL_COMMUNITY): Payer: Self-pay | Admitting: Orthopedic Surgery

## 2018-06-16 LAB — GLUCOSE, CAPILLARY
Glucose-Capillary: 210 mg/dL — ABNORMAL HIGH (ref 70–99)
Glucose-Capillary: 247 mg/dL — ABNORMAL HIGH (ref 70–99)
Glucose-Capillary: 188 mg/dL — ABNORMAL HIGH (ref 70–99)
Glucose-Capillary: 113 mg/dL — ABNORMAL HIGH (ref 70–99)

## 2018-06-16 LAB — HEMOGLOBIN AND HEMATOCRIT, BLOOD
HCT: 36.4 % — ABNORMAL LOW (ref 39.0–52.0)
Hemoglobin: 12 g/dL — ABNORMAL LOW (ref 13.0–17.0)

## 2018-06-16 MED ORDER — INSULIN ASPART 100 UNIT/ML ~~LOC~~ SOLN: 0.0000 [IU] | Freq: Every day | SUBCUTANEOUS | Status: DC

## 2018-06-16 MED ORDER — SENNOSIDES-DOCUSATE SODIUM 8.6-50 MG PO TABS
2.0000 | ORAL_TABLET | Freq: Two times a day (BID) | ORAL | Status: DC
  Administered 2018-06-16 – 2018-06-17 (×3): 2 via ORAL
  Filled 2018-06-16 (×3): qty 2

## 2018-06-16 MED ORDER — INSULIN ASPART 100 UNIT/ML ~~LOC~~ SOLN
0.0000 [IU] | Freq: Three times a day (TID) | SUBCUTANEOUS | Status: DC
  Administered 2018-06-16: 3 [IU] via SUBCUTANEOUS

## 2018-06-16 MED ORDER — BISACODYL 10 MG RE SUPP: 10.0000 mg | Freq: Once | RECTAL | Status: DC

## 2018-06-16 NOTE — Progress Notes (Signed)
Pharmacy Antibiotic Note  [name] is a 53 y.o. male admitted on 06/12/2018 with RLE osteomyelitis, DM foot infection.  Pharmacy has been consulted for Vancomycin & Cefepime dosing. Flagyl per MD  Plan: Discontinue abx 24 hr after surgical procedure per Ortho Op note Cefepime 2gm q8 Vancomycin 1500mg  q12, for AUC 537, using SCr 0.94 Flagyl 500mg  po q8  Height: 6\' 3"  (190.5 cm) Weight: 185 lb (83.9 kg) IBW/kg (Calculated) : 84.5  Temp (24hrs), Avg:98.4 F (36.9 C), Min:98 F (36.7 C), Max:98.9 F (37.2 C)  Recent Labs  Lab 06/12/18 1220 06/13/18 0239 06/14/18 0204 06/15/18 0533  WBC 8.9 6.0 6.9 5.6  CREATININE 0.90 0.94 0.81 0.81    Estimated Creatinine Clearance: 122.3 mL/min (by C-G formula based on SCr of 0.81 mg/dL).    Allergies  Allergen Reactions  . Benadryl [Diphenhydramine Hcl] Other (See Comments)    Leg spasms   . Trazodone And Nefazodone Other (See Comments)    Leg spasms     Antimicrobials this admission: Cefazolin 5/24 >> 5/25 Vanc 5/25 >> 5/28 Cefepime 5/25 >> 5/28 Flagyl po 5/25 >> 5/28  Dose adjustments this admission:  Microbiology results:  5/24 SARS-CoV-2: negative  Thank you for allowing pharmacy to be a part of this patient's care.  Minda Ditto PharmD [phone] 06/16/2018 1:55 PM

## 2018-06-16 NOTE — Progress Notes (Signed)
PROGRESS NOTE  [name] OHF:[id number] DOB: [birth year] DOA: 06/12/2018 PCP: Lucia Gaskins, MD  HPI/Recap of past 24 hours: [name] a 53 y.o.malewith medical history significant fordiabetes and diabetic foot ulcers, diabetic neuropathy, chronic pain, and prior fourth toe amputation of right foot who presented with worsening erythema and warmth of his right foot and leg over the last 2 days. He denies any fevers or chills or other symptomatology. No drainage noted from foot ulcers.He was noted to have recent treatment on oral doxycycline for 2 weeks that he has completed and appears to have failed outpatient treatment.He has minimal edema noted over the dorsum of his right foot as well. He has last seen his orthopedist Dr. Sharol Given approximately 1 week ago he did not have any issues at that time.  ED Course:He has been started on some IV fluid as well as Ancef and given some Dilaudid for pain control. Right foot x-ray does appear to demonstrate some osteomyelitis. Laboratory data otherwise unremarkable with no signs of sepsis noted. Discussed case with Dr. Sharol Given and initially felt that he could be discharged until information about his recent oral antibiotic use came to light. He now recommends IV antibiotics and admission to [facility] where he will see patient in a.m. for further evaluation and management.  Imaging with severe osteomyelitis of second metatarsal head on R foot.  Ortho planning for transmetatarsal amputation on 5/27.  06/16/18: Patient seen and examined at his bedside.  No acute events overnight.  POD #1 post right foot transmetatarsal amputation by orthopedic surgery Dr. Sharol Given.  Reports pain is well controlled however feels constipated, no bowel movement since Saturday.  Increase bowel regimen.   Assessment/Plan: Active Problems:   Cellulitis   Osteomyelitis of right foot (HCC)   Cellulitis of right foot   Charcot foot due to diabetes mellitus ([place])  Right  Diabetic Foot Ulcer  Right Lower Extremity Cellulitis Left Diabetic Foot Ulcer/severe osteomyelitis of the second metatarsal head of right foot status post right foot transmetatarsal amputation on 06/15/2018 -Completed course of cefepime/IV vanc/po flagyl - ABI's - pending  - Right lower extremity with evidence of cellulitis - MRI R foot with soft tissue ulcer along plantar aspect of second metatarsal head, severe osteomyelitis of 2nd metatarsal head.  Heterogenous T2 hyperintense signal around the first metatarsal head approximately 8 mm circumferentially concerning for abscess versus phlegmon.  Prior lisfran fracture subluxation with prior ORIF.  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 -Continue GI regimen for constipation prophylaxis -H&H stable, hemoglobin 12.0 -Wound VAC in place -PT per Dr. Sharol Given recommendations  Constipation, likely opioid induced Increase bowel regimen On Senokot 2 tablet twice daily and MiraLAX twice daily Add Dulcolax suppository  Type 2 diabetes with hyperglycemia -Maintain on SSI -mealtime insulin ordered -Repeat A1c (6.2)  Chronic pain -Maintain on home medications with IV pain medication for breakthrough  Dyslipidemia -Maintain on statin  DVT prophylaxis: lovenox Code Status: full  Family Communication: none at bedside Disposition Plan: pending surgery   Consultants:   Orthopedics   Procedures: Planned right transmetatarsal amputation on 06/15/2018     Objective: Vitals:   06/16/18 0041 06/16/18 0500 06/16/18 0810 06/16/18 1209  BP: (!) 146/87 118/76 (!) 147/80 127/86  Pulse: 93 94 82 71  Resp: 18 18    Temp: 98.4 F (36.9 C) 98 F (36.7 C)  98.3 F (36.8 C) 98.6 F (37 C)  TempSrc: Oral Oral Oral Oral  SpO2: 98% 99% 100% (!) 10%  Weight:      Height:        Intake/Output Summary (Last 24 hours) at 06/16/2018 1528 Last data filed at 06/16/2018 1402 Gross per 24 hour  Intake 3040.04 ml  Output 5000 ml  Net -1959.96 ml   Filed Weights   06/12/18 1106 06/15/18 1040  Weight: 83.9 kg 83.9 kg    Exam:  . General: 53 y.o. year-old male well developed well nourished in no acute distress.  Alert and oriented x3. . Cardiovascular: Regular rate and rhythm with no rubs or gallops.  No thyromegaly or JVD noted.   Marland Kitchen Respiratory: Clear to auscultation with no wheezes or rales. Good inspiratory effort. . Abdomen: Soft nontender nondistended with normal bowel sounds x4 quadrants. . Musculoskeletal: Trace lower extremity edema.  Right foot post transmetatarsal amputation wound VAC in place.   Marland Kitchen Psychiatry: Mood is appropriate for condition and setting   Data Reviewed: CBC: Recent Labs  Lab 06/12/18 1220 06/13/18 0239 06/14/18 0204 06/15/18 0533 06/16/18 0758  WBC 8.9 6.0 6.9 5.6  --   NEUTROABS 5.6  --   --   --   --   HGB 13.5 11.7* 12.0* 12.2* 12.0*  HCT 41.5 36.3* 37.1* 36.9* 36.4*  MCV 84.2 83.1 83.2 82.6  --   PLT 236 225 241 237  --    Basic Metabolic Panel: Recent Labs  Lab 06/12/18 1220 06/13/18 0239 06/14/18 0204 06/15/18 0533  NA 134* 138 139 139  K 3.8 3.7 3.8 3.9  CL 96* 103 105 107  CO2 '25 26 26 25  '$ GLUCOSE 174* 142* 162* 138*  BUN '13 14 12 9  '$ CREATININE 0.90 0.94 0.81 0.81  CALCIUM 9.1 8.6* 8.9 9.0  MG  --   --  1.9 1.9   GFR: Estimated Creatinine Clearance: 122.3 mL/min (by C-G formula based on SCr of 0.81 mg/dL). Liver Function Tests: Recent Labs  Lab 06/12/18 1220 06/14/18 0204 06/15/18 0533  AST '19 19 27  '$ ALT '17 14 19  '$ ALKPHOS 95 75 77  BILITOT 0.6 0.2* 0.5  PROT 7.2 5.4* 5.6*  ALBUMIN 3.9 2.8* 2.9*   No results for input(s): LIPASE, AMYLASE in the last 168 hours. No results  for input(s): AMMONIA in the last 168 hours. Coagulation Profile: No results for input(s): INR, PROTIME in the last 168 hours. Cardiac Enzymes: No results for input(s): CKTOTAL, CKMB, CKMBINDEX, TROPONINI in the last 168 hours. BNP (last 3 results) No results for input(s): PROBNP in the last 8760 hours. HbA1C: Recent Labs    06/13/18 1649  HGBA1C 6.2*   CBG: Recent Labs  Lab 06/15/18 1342 06/15/18 1607 06/15/18 2107 06/16/18 0743 06/16/18 1159  GLUCAP 239* 206* 279* 210* 247*   Lipid Profile: No results for input(s): CHOL, HDL, LDLCALC, TRIG, CHOLHDL, LDLDIRECT in the last 72 hours. Thyroid Function Tests: No results for input(s): TSH, T4TOTAL, FREET4, T3FREE, THYROIDAB in the last 72 hours. Anemia Panel: No results for input(s): VITAMINB12, FOLATE, FERRITIN, TIBC, IRON, RETICCTPCT in the last 72 hours. Urine analysis:    Component Value Date/Time   COLORURINE STRAW (A) 09/04/2016 0741   APPEARANCEUR CLEAR 09/04/2016 0741   LABSPEC 1.001 (L) 09/04/2016 0741   PHURINE 6.0 09/04/2016 0741   GLUCOSEU >=500 (  A) 09/04/2016 0741   HGBUR NEGATIVE 09/04/2016 0741   BILIRUBINUR NEGATIVE 09/04/2016 0741   KETONESUR 5 (A) 09/04/2016 0741   PROTEINUR NEGATIVE 09/04/2016 0741   UROBILINOGEN 0.2 12/18/2013 1402   NITRITE NEGATIVE 09/04/2016 0741   LEUKOCYTESUR NEGATIVE 09/04/2016 0741   Sepsis Labs: '@LABRCNTIP'$ (procalcitonin:4,lacticidven:4)  ) Recent Results (from the past 240 hour(s))  SARS Coronavirus 2 (CEPHEID - Performed in [facility] hospital lab), Hosp Order     Status: None   Collection Time: 06/12/18 12:00 PM  Result Value Ref Range Status   SARS Coronavirus 2 NEGATIVE NEGATIVE Final    Comment: (NOTE) If result is NEGATIVE SARS-CoV-2 target nucleic acids are NOT DETECTED. The SARS-CoV-2 RNA is generally detectable in upper and lower  respiratory specimens during the acute phase of infection. The lowest  concentration of SARS-CoV-2 viral copies this assay can  detect is 250  copies / mL. A negative result does not preclude SARS-CoV-2 infection  and should not be used as the sole basis for treatment or other  patient management decisions.  A negative result may occur with  improper specimen collection / handling, submission of specimen other  than nasopharyngeal swab, presence of viral mutation(s) within the  areas targeted by this assay, and inadequate number of viral copies  (<250 copies / mL). A negative result must be combined with clinical  observations, patient history, and epidemiological information. If result is POSITIVE SARS-CoV-2 target nucleic acids are DETECTED. The SARS-CoV-2 RNA is generally detectable in upper and lower  respiratory specimens dur ing the acute phase of infection.  Positive  results are indicative of active infection with SARS-CoV-2.  Clinical  correlation with patient history and other diagnostic information is  necessary to determine patient infection status.  Positive results do  not rule out bacterial infection or co-infection with other viruses. If result is PRESUMPTIVE POSTIVE SARS-CoV-2 nucleic acids MAY BE PRESENT.   A presumptive positive result was obtained on the submitted specimen  and confirmed on repeat testing.  While 2019 novel coronavirus  (SARS-CoV-2) nucleic acids may be present in the submitted sample  additional confirmatory testing may be necessary for epidemiological  and / or clinical management purposes  to differentiate between  SARS-CoV-2 and other Sarbecovirus currently known to infect humans.  If clinically indicated additional testing with an alternate test  methodology [phone]) is advised. The SARS-CoV-2 RNA is generally  detectable in upper and lower respiratory sp ecimens during the acute  phase of infection. The expected result is Negative. Fact Sheet for Patients:  StrictlyIdeas.no Fact Sheet for Healthcare Providers:  BankingDealers.co.za This test is not yet approved or cleared by the [place] FDA and has been authorized for detection and/or diagnosis of SARS-CoV-2 by FDA under an Emergency Use Authorization (EUA).  This EUA will remain in effect (meaning this test can be used) for the duration of the COVID-19 declaration under Section 564(b)(1) of the Act, 21 U.S.C. section 360bbb-3(b)(1), unless the authorization is terminated or revoked sooner. Performed at [facility], [address], [place], [place] [address]   Surgical pcr screen     Status: None   Collection Time: 06/14/18  4:55 PM  Result Value Ref Range Status   MRSA, PCR NEGATIVE NEGATIVE Final   Staphylococcus aureus NEGATIVE NEGATIVE Final    Comment: (NOTE) The Xpert SA Assay (FDA approved for NASAL specimens in patients 72 years of age and older), is one component of a comprehensive surveillance program. It is not intended to diagnose infection  nor to guide or monitor treatment. Performed at [facility], [place] [address], [place], [place] [address]       Studies: No results found.  Scheduled Meds: . cholecalciferol  10,000 Units Oral Daily  . clotrimazole   Topical BID  . enoxaparin (LOVENOX) injection  40 mg Subcutaneous Q24H  . insulin aspart  0-15 Units Subcutaneous TID WC  . insulin aspart  0-5 Units Subcutaneous QHS  . insulin aspart  3 Units Subcutaneous TID WC  . nicotine  21 mg Transdermal Daily  . polyethylene glycol  17 g Oral BID  . QUEtiapine  100 mg Oral QHS  . rosuvastatin  20 mg Oral Daily  . senna-docusate  2 tablet Oral BID  . vitamin C  500 mg Oral Daily    Continuous Infusions: . sodium chloride Stopped (06/15/18 1237)  . lactated ringers 10 mL/hr at 06/15/18 1047  . methocarbamol (ROBAXIN) IV       LOS: 4 days     Kayleen Memos, MD [facility] Pager [phone]  If 7PM-7AM, please contact night-coverage [email or web address] Password [facility] 06/16/2018,  3:28 PM

## 2018-06-16 NOTE — Anesthesia Postprocedure Evaluation (Signed)
Anesthesia Post Note  Patient: [name]  Procedure(s) Performed: RIGHT FOOT TRANSMETATARSAL AMPUTATION (Right Foot)     Patient location during evaluation: PACU Anesthesia Type: General Level of consciousness: awake and alert Pain management: pain level controlled Vital Signs Assessment: post-procedure vital signs reviewed and stable Respiratory status: spontaneous breathing, nonlabored ventilation, respiratory function stable and patient connected to nasal cannula oxygen Cardiovascular status: blood pressure returned to baseline and stable Postop Assessment: no apparent nausea or vomiting Anesthetic complications: no    Last Vitals:  Vitals:   06/16/18 0810 06/16/18 1209  BP: (!) 147/80 127/86  Pulse: 82 71  Resp:    Temp: 36.8 C 37 C  SpO2: 100% (!) 10%    Last Pain:  Vitals:   06/16/18 1620  TempSrc:   PainSc: 3                  Cashtyn Pouliot

## 2018-06-16 NOTE — Progress Notes (Signed)
Physical Therapy Treatment Patient Details Name: [name] MRN: [id number] DOB: Jan 02, 1966 Today's Date: 06/16/2018    History of Present Illness Pt is a 53 y.o. male admitted 06/12/18 with R foot osteomyelitis. S/p R foot transmet amputation 5/27. PMH includes DM, bilateral foot ulcers, neuropathy, chronic pain, previous R 4th toe amputation.    PT Comments    Patient is progressing very well towards their physical therapy goals. Ambulating 80 feet with walker and supervision. Education re: weightbearing precautions, signs of infection, daily foot inspections, proper footwear, home exercise program. D/c plan remains appropriate.     Follow Up Recommendations  Home health PT;Supervision - Intermittent     Equipment Recommendations  None recommended by PT    Recommendations for Other Services       Precautions / Restrictions Precautions Precautions: Fall Restrictions Weight Bearing Restrictions: Yes RLE Weight Bearing: Touchdown weight bearing    Mobility  Bed Mobility Overal bed mobility: Modified Independent                Transfers Overall transfer level: Needs assistance Equipment used: None Transfers: Sit to/from Stand Sit to Stand: Supervision            Ambulation/Gait Ambulation/Gait assistance: Supervision Gait Distance (Feet): 80 Feet Assistive device: Rolling walker (2 wheeled) Gait Pattern/deviations: Step-to pattern;Antalgic;Decreased step length - left Gait velocity: decreased   General Gait Details: Cues for sequencing, weightbearing status, step to pattern   Stairs             Wheelchair Mobility    Modified Rankin (Stroke Patients Only)       Balance Overall balance assessment: Mild deficits observed, not formally tested                                          Cognition Arousal/Alertness: Awake/alert Behavior During Therapy: WFL for tasks assessed/performed Overall Cognitive Status: Within  Functional Limits for tasks assessed                                        Exercises Other Exercises Other Exercises: Reviewed seated HEP    General Comments        Pertinent Vitals/Pain Pain Assessment: No/denies pain    Home Living                      Prior Function            PT Goals (current goals can now be found in the care plan section) Acute Rehab PT Goals Patient Stated Goal: Return home with HHPT & HHRN Potential to Achieve Goals: Good Progress towards PT goals: Progressing toward goals    Frequency    Min 3X/week      PT Plan Current plan remains appropriate    Co-evaluation              AM-PAC PT "6 Clicks" Mobility   Outcome Measure  Help needed turning from your back to your side while in a flat bed without using bedrails?: None Help needed moving from lying on your back to sitting on the side of a flat bed without using bedrails?: None Help needed moving to and from a bed to a chair (including a wheelchair)?: None Help needed standing up from a chair using your  arms (e.g., wheelchair or bedside chair)?: None Help needed to walk in hospital room?: A Little Help needed climbing 3-5 steps with a railing? : A Little 6 Click Score: 22    End of Session   Activity Tolerance: Patient tolerated treatment well Patient left: in chair;with call bell/phone within reach Nurse Communication: Mobility status PT Visit Diagnosis: Other abnormalities of gait and mobility (R26.89);Pain Pain - Right/Left: Right Pain - part of body: Ankle and joints of foot     Time: 6945-0388 PT Time Calculation (min) (ACUTE ONLY): 22 min  Charges:  $Gait Training: 8-22 mins                     Ellamae Sia, [place], DPT Acute Rehabilitation Services Pager [phone] Office [phone]    Willy Eddy 06/16/2018, 5:38 PM

## 2018-06-16 NOTE — Progress Notes (Signed)
Patient ID: [name], male   DOB: [birth year], 53 y.o.   MRN: [id number] Postoperative day 1 transmetatarsal amputation of the right.  There is 50 cc in the wound VAC canister.  Patient may discharge when safe with therapy.  Discussed with the patient that ideally nonweightbearing on the right is the best but patient states he does not have the strength in his arms to be strict nonweightbearing on the right.  Discussed that it is okay for partial weightbearing on the right.  Patient also has an ulcer beneath the left first metatarsal head which needs to be protected.

## 2018-06-16 NOTE — Plan of Care (Signed)

## 2018-06-16 NOTE — Plan of Care (Signed)

## 2018-06-17 LAB — BASIC METABOLIC PANEL
Anion gap: 10 (ref 5–15)
BUN: 8 mg/dL (ref 6–20)
CO2: 25 mmol/L (ref 22–32)
Calcium: 8.8 mg/dL — ABNORMAL LOW (ref 8.9–10.3)
Chloride: 107 mmol/L (ref 98–111)
Creatinine, Ser: 0.79 mg/dL (ref 0.61–1.24)
GFR calc Af Amer: >60 mL/min (ref >60)
GFR calc non Af Amer: >60 mL/min (ref >60)
Glucose, Bld: 114 mg/dL — ABNORMAL HIGH (ref 70–99)
Potassium: 4 mmol/L (ref 3.5–5.1)
Sodium: 142 mmol/L (ref 135–145)

## 2018-06-17 LAB — CBC
HCT: 39 % (ref 39.0–52.0)
Hemoglobin: 13 g/dL (ref 13.0–17.0)
MCH: 27.7 pg (ref 26.0–34.0)
MCHC: 33.3 g/dL (ref 30.0–36.0)
MCV: 83.2 fL (ref 80.0–100.0)
Platelets: 232 10*3/uL (ref 150–400)
RBC: 4.69 MIL/uL (ref 4.22–5.81)
RDW: 14.3 % (ref 11.5–15.5)
WBC: 7.7 10*3/uL (ref 4.0–10.5)
nRBC: 0 % (ref 0.0–0.2)

## 2018-06-17 LAB — GLUCOSE, CAPILLARY: Glucose-Capillary: 107 mg/dL — ABNORMAL HIGH (ref 70–99)

## 2018-06-17 MED ORDER — OXYCODONE-ACETAMINOPHEN 5-325 MG PO TABS: 1.0000 | ORAL_TABLET | Freq: Three times a day (TID) | ORAL | 0 refills | Status: DC | PRN

## 2018-06-17 MED ORDER — BLOOD GLUCOSE MONITOR KIT: PACK | 0 refills | Status: DC

## 2018-06-17 MED ORDER — ROSUVASTATIN CALCIUM 20 MG PO TABS: 20.0000 mg | ORAL_TABLET | Freq: Every day | ORAL | 0 refills | Status: DC

## 2018-06-17 MED ORDER — SENNOSIDES-DOCUSATE SODIUM 8.6-50 MG PO TABS: 2.0000 | ORAL_TABLET | Freq: Every day | ORAL | 0 refills | Status: DC

## 2018-06-17 MED ORDER — METHOCARBAMOL 500 MG PO TABS: 500.0000 mg | ORAL_TABLET | Freq: Three times a day (TID) | ORAL | 0 refills | Status: DC | PRN

## 2018-06-17 MED ORDER — NICOTINE 21 MG/24HR TD PT24: 21.0000 mg | MEDICATED_PATCH | Freq: Every day | TRANSDERMAL | 0 refills | Status: DC

## 2018-06-17 MED ORDER — POLYETHYLENE GLYCOL 3350 17 G PO PACK: 17.0000 g | PACK | Freq: Every day | ORAL | 0 refills | Status: DC | PRN

## 2018-06-17 MED ORDER — METFORMIN HCL 500 MG PO TABS: 1000.0000 mg | ORAL_TABLET | Freq: Two times a day (BID) | ORAL | 0 refills | Status: DC

## 2018-06-17 NOTE — Progress Notes (Signed)
Discharged Pt to home with Prevena Vac connected to right lower leg. Alert, oriented and stable. Instructions given, explained and belongings were returned accordingly.

## 2018-06-17 NOTE — Discharge Instructions (Signed)
Osteomyelitis, Adult  Bone infections (osteomyelitis) occur when bacteria or other germs get inside a bone. This can happen if you have an infection in another part of your body that spreads through your blood. Germs from your skin or from outside of your body can also cause this type of infection if you have a wound or a broken bone (fracture) that breaks the skin. Bone infections need to be treated quickly to prevent bone damage and to prevent the infection from spreading to other areas of your body. What are the causes? Most bone infections are caused by bacteria. They can also be caused by other germs, such as viruses and funguses. What increases the risk? You are more likely to develop this condition if you:  Recently had surgery, especially bone or joint surgery.  Have a long-term (chronic) disease, such as: ? Diabetes. ? HIV (human immunodeficiency virus). ? Rheumatoid arthritis. ? Sickle cell anemia. ? Kidney disease that requires dialysis.  Are aged 55 years or older.  Have a condition or take medicines that block or weaken your body's defense system (immune system).  Have a condition that reduces your blood flow.  Have an artificial joint.  Have had a joint or bone repaired with plates or screws (surgical hardware).  Use IV drugs.  Have a central line for IV access.  Have had trauma, such as stepping on a nail or a broken bone that came through the skin. What are the signs or symptoms? Symptoms vary depending on the type and location of your infection. Common symptoms of bone infections include:  Fever and chills.  Skin redness and warmth.  Swelling.  Pain and stiffness.  Drainage of fluid or pus near the infection. How is this diagnosed? This condition may be diagnosed based on:  Your symptoms and medical history.  A physical exam.  Tests, such as: ? A sample of tissue, fluid, or blood taken to be examined under a microscope. ? Pus or discharge swabbed  from a wound for testing to identify germs and to determine what type of medicine will kill them (culture and sensitivity). ? Blood tests.  Imaging studies. These may include: ? X-rays. ? MRI. ? CT scan. ? Bone scan. ? Ultrasound. How is this treated? Treatment for this condition depends on the cause and type of infection. Antibiotic medicines are usually the first treatment for a bone infection. This may be done in a hospital at first. You may have to continue IV antibiotics at home or take antibiotics by mouth for several weeks after that. Other treatments may include surgery to remove:  Dead or dying tissue from a bone.  An infected artificial joint.  Infected plates or screws that were used to repair a broken bone. Follow these instructions at home: Medicines   Take over-the-counter and prescription medicines only as told by your health care provider.  Take your antibiotic medicine as told by your health care provider. Do not stop taking the antibiotic even if you start to feel better.  Follow instructions from your health care provider about how to take IV antibiotics at home. You may need to have a nurse come to your home to give you the IV antibiotics. General instructions   Ask your health care provider if you have any restrictions on your activities.  If directed, put ice on the affected area: ? Put ice in a plastic bag. ? Place a towel between your skin and the bag. ? Leave the ice on for 20  give you the IV antibiotics.  General instructions    · Ask your health care provider if you have any restrictions on your activities.  · If directed, put ice on the affected area:  ? Put ice in a plastic bag.  ? Place a towel between your skin and the bag.  ? Leave the ice on for 20 minutes, 2-3 times a day.  · Wash your hands often with soap and water. If soap and water are not available, use hand sanitizer.  · Do not use any products that contain nicotine or tobacco, such as cigarettes and e-cigarettes. These can delay bone healing. If you need help quitting, ask your health care provider.  · Keep all follow-up visits as told by your health care provider. This is important.  Contact a health care provider if:  · You develop a fever or chills.  · You have  redness, warmth, pain, or swelling that returns after treatment.  Get help right away if:  · You have rapid breathing or you have trouble breathing.  · You have chest pain.  · You cannot drink fluids or make urine.  · The affected area swells, changes color, or turns blue.  · You have numbness or severe pain in the affected area.  Summary  · Bone infections (osteomyelitis) occur when bacteria or other germs get inside a bone.  · You may be more likely to get this type of infection if you have a condition, such as diabetes, that lowers your ability to fight infection or increases your chances of getting an infection.  · Most bone infections are caused by bacteria. They can also be caused by other germs, such as viruses and funguses.  · Treatment for this condition usually starts with taking antibiotics. Further treatment depends on the cause and type of infection.  This information is not intended to replace advice given to you by your health care provider. Make sure you discuss any questions you have with your health care provider.  Document Released: 01/05/2005 Document Revised: 01/14/2017 Document Reviewed: 01/14/2017  [employer] Interactive Patient Education © 2019 [employer]

## 2018-06-17 NOTE — Progress Notes (Signed)
Subjective: 2 Days Post-Op Procedure(s) (LRB): RIGHT FOOT TRANSMETATARSAL AMPUTATION (Right) Patient reports pain as mild and moderate.   Complains of constipation and receiving medications to help with this.  Objective: Vital signs in last 24 hours: Temp:  [98.6 F (37 C)] 98.6 F (37 C) (05/29 0528) Pulse Rate:  [71-78] 77 (05/29 0528) Resp:  [18] 18 (05/29 0528) BP: (121-142)/(82-95) 121/82 (05/29 0528) SpO2:  [10 %-100 %] 99 % (05/29 0528)  Intake/Output from previous day: 05/28 0701 - 05/29 0700 In: 760 [P.O.:760] Out: 3025 [Urine:3000; Drains:25] Intake/Output this shift: No intake/output data recorded.  Recent Labs    06/15/18 0533 06/16/18 0758 06/17/18 0517  HGB 12.2* 12.0* 13.0   Recent Labs    06/15/18 0533 06/16/18 0758 06/17/18 0517  WBC 5.6  --  7.7  RBC 4.47  --  4.69  HCT 36.9* 36.4* 39.0  PLT 237  --  232   Recent Labs    06/15/18 0533 06/17/18 0517  NA 139 142  K 3.9 4.0  CL 107 107  CO2 25 25  BUN 9 8  CREATININE 0.81 0.79  GLUCOSE 138* 114*  CALCIUM 9.0 8.8*   No results for input(s): LABPT, INR in the last 72 hours.  VAC dressing intact over the right transmetatarsal amputation site and functioning well. ~ 75 cc in VAC canister.    Assessment/Plan: 2 Days Post-Op Procedure(s) (LRB): RIGHT FOOT TRANSMETATARSAL AMPUTATION (Right) Plan DC with Prevena VAC.  Has follow up in the office already scheduled for 06/23/2018 at 9:15 am. Okay for partial weight bearing if necessary on the right per Dr. Sharol Given and discussed limiting ambulation and elevating the right leg at home.    Erlinda Hong, PA-C 06/17/2018, 9:07 AM  [place]

## 2018-06-17 NOTE — Discharge Summary (Addendum)
Discharge Summary  [name] SXJ:[id number] DOB: 1965-07-27  PCP: Lucia Gaskins, MD  Admit date: 06/12/2018 Discharge date: 06/17/2018  Time spent: 35 minutes  Recommendations for Outpatient Follow-up:  1. Follow-up with orthopedic surgery Dr. Sharol Given 2. Follow-up with your primary care provider 3. Take your medications as prescribed 4. Continue physical therapy 5. Fall precautions  Recommendations per orthopedic surgery: RIGHT FOOT TRANSMETATARSAL AMPUTATION (Right) Plan DC with Prevena VAC.  Has follow up in the office already scheduled for 06/23/2018 at 9:15 am. Okay for partial weight bearing if necessary on the right per Dr. Sharol Given and discussed limiting ambulation and elevating the right leg at home.    Discharge Diagnoses:  Active Hospital Problems   Diagnosis Date Noted   Osteomyelitis of right foot ([facility])    Cellulitis of right foot    Charcot foot due to diabetes mellitus ([facility])    Cellulitis 06/12/2018    Resolved Hospital Problems  No resolved problems to display.    Discharge Condition: Stable  Diet recommendation: Resume previous diet  Vitals:   06/16/18 2134 06/17/18 0528  BP: (!) 142/95 121/82  Pulse: 78 77  Resp: 18 18  Temp: 98.6 F (37 C) 98.6 F (37 C)  SpO2: 100% 99%    History of present illness:   [name] a 53 y.o.malewith medical history significant fordiabetes and diabetic foot ulcers, diabetic neuropathy, chronic pain, and prior fourth toe amputation of right foot who presented with worsening erythema and warmth of his right foot and leg over the last 2 days. He denies any fevers or chills or other symptomatology. No drainage noted from foot ulcers.He was noted to have recent treatment on oral doxycycline for 2 weeks that he has completed and appears to have failed outpatient treatment.He has minimal edema noted over the dorsum of his right foot as well. He has last seen his orthopedist Dr. Sharol Given approximately 1 week ago he  did not have any issues at that time.  ED Course:He has been started on some IV fluid as well as Ancef and given some Dilaudid for pain control. Right foot x-ray does appear to demonstrate some osteomyelitis. Laboratory data otherwise unremarkable with no signs of sepsis noted. Discussed case with Dr. Sharol Given and initially felt that he could be discharged until information about his recent oral antibiotic use came to light. He now recommends IV antibiotics and admission to [facility] where he will see patient in a.m. for further evaluation and management.  Imaging with severe osteomyelitis of second metatarsal head on R foot. Ortho planning for transmetatarsal amputation on 5/27.  06/17/18: Patient seen and examined at bedside.  No acute events overnight.  Vital signs and labs reviewed and are stable.  Orthopedics signed off to follow-up outpatient with Dr. Sharol Given.  Recommendations as stated above.  On the day of discharge, the patient was hemodynamically stable.  He will need to follow-up with orthopedic surgery and his primary care provider posthospitalization.  He will need to continue physical therapy and follow recommendations as stated by orthopedic surgery above.  Home health services will be provided at discharge for physical therapy and for RN to monitor and manage left foot wound.   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  Continue with wound care VAC x1 week.  Follow-up with orthopedic surgery Dr. Sharol Given in 1  week. - Right lower extremity with evidence of cellulitis - MRI R foot with soft tissue ulcer along plantar aspect of second metatarsal head, severe osteomyelitis of 2nd metatarsal head. Heterogenous T2 hyperintense signal around the first metatarsal head approximately 8 mm circumferentially concerning for abscess versus phlegmon. Prior lisfran fracture subluxation with prior ORIF. 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 -Continue GI regimen for constipation prophylaxis -H&H stable, hemoglobin 12.0 -Wound VAC in place -PT per Dr. Sharol Given recommendations  Left diabetic foot ulcer -MRI L foot without evidence of septic arthritis or osteo -Home health services with wound care RN at discharge -Follow-up with Dr. Sharol Given outpatient on 06/23/2018 and 9:15 AM  Constipation, likely opioid induced Continue bowel regimen while on opiates Positive flatus, no nausea No issues with oral intake Bowel sounds on exam  Type 2 diabetes with hyperglycemia -Maintain on SSI -mealtime insulin ordered -Repeat A1c(6.2)  Chronic pain -Pain medications as needed for severe pain  Dyslipidemia -Continue Crestor   Code Status:full    Consultants:  Orthopedics   Procedures: Planned right transmetatarsal amputation on 06/15/2018    Discharge Exam: BP 121/82    Pulse 77    Temp 98.6 F (37 C)    Resp 18    Ht 6' 3"  (1.905 m)    Wt 83.9 kg    SpO2 99%    BMI 23.12 kg/m   General: 53 y.o. year-old male well developed well nourished in no acute distress.  Alert and oriented x3.  Cardiovascular: Regular rate and rhythm with no rubs or gallops.  No thyromegaly or JVD noted.    Respiratory: Clear to auscultation with  no wheezes or rales. Good inspiratory effort.  Abdomen: Obese nontender with normal bowel sounds x4 quadrants.  Musculoskeletal: Right foot post transmetatarsal amputation covered in surgical dressing with wound VAC in place.  Psychiatry: Mood is appropriate for condition and setting  Discharge Instructions You were cared for by a hospitalist during your hospital stay. If you have any questions about your discharge medications or the care you received while you were in the hospital after you are discharged, you can call the unit and asked to speak with the hospitalist on call if the hospitalist that took care of you is not available. Once you are discharged, your primary care physician will handle any further medical issues. Please note that NO REFILLS for any discharge medications will be authorized once you are discharged, as it is imperative that you return to your primary care physician (or establish a relationship with a primary care physician if you do not have one) for your aftercare needs so that they can reassess your need for medications and monitor your lab values.   Allergies as of 06/17/2018      Reactions   Benadryl [diphenhydramine Hcl] Other (See Comments)   Leg spasms    Trazodone And Nefazodone Other (See Comments)   Leg spasms       Medication List    STOP taking these  medications   acetaminophen 325 MG tablet Commonly known as:  TYLENOL   diltiazem 30 MG tablet Commonly known as:  CARDIZEM   doxycycline 100 MG tablet Commonly known as:  VIBRA-TABS   Farxiga 10 MG Tabs tablet Generic drug:  dapagliflozin propanediol   hydrOXYzine 10 MG tablet Commonly known as:  ATARAX/VISTARIL   saccharomyces boulardii 250 MG capsule Commonly known as:  Florastor   sulfamethoxazole-trimethoprim 800-160 MG tablet Commonly known as:  BACTRIM DS     TAKE these medications   blood glucose meter kit and supplies Kit Dispense based on patient and insurance preference. Use  up to four times daily as directed. (FOR ICD-9 250.00, 250.01).   metFORMIN 500 MG tablet Commonly known as:  Glucophage Take 2 tablets (1,000 mg total) by mouth 2 (two) times daily with a meal.   methocarbamol 500 MG tablet Commonly known as:  ROBAXIN Take 1 tablet (500 mg total) by mouth every 8 (eight) hours as needed for muscle spasms.   nicotine 21 mg/24hr patch Commonly known as:  NICODERM CQ - dosed in mg/24 hours Place 1 patch (21 mg total) onto the skin daily.   oxyCODONE-acetaminophen 5-325 MG tablet Commonly known as:  PERCOCET/ROXICET Take 1 tablet by mouth every 8 (eight) hours as needed for moderate pain or severe pain. What changed:    reasons to take this  Another medication with the same name was removed. Continue taking this medication, and follow the directions you see here.   polyethylene glycol 17 g packet Commonly known as:  MIRALAX / GLYCOLAX Take 17 g by mouth daily as needed for mild constipation.   QUEtiapine 100 MG tablet Commonly known as:  SEROQUEL Take 100 mg by mouth at bedtime.   rosuvastatin 20 MG tablet Commonly known as:  CRESTOR Take 1 tablet (20 mg total) by mouth daily.   senna-docusate 8.6-50 MG tablet Commonly known as:  Senokot-S Take 2 tablets by mouth at bedtime.   silver sulfADIAZINE 1 % cream Commonly known as:  Silvadene Apply 1 application topically daily.   vitamin C 500 MG tablet Commonly known as:  ASCORBIC ACID Take 500 mg by mouth daily.   Vitamin D3 125 MCG (5000 UT) Caps Take 10,000 Units by mouth daily.      Allergies  Allergen Reactions   Benadryl [Diphenhydramine Hcl] Other (See Comments)    Leg spasms    Trazodone And Nefazodone Other (See Comments)    Leg spasms    Follow-up Information    [name], MD Follow up in 1 week(s).   Specialty:  Orthopedic Surgery Why:  Appointment has been made for you: June 23, 2018 at 9:15 AM.  Please call to confirm. Contact information: [place] [address] [place] Follow up.   Why:  [place]        Dondiego, Richard, MD. Call in 1 day(s).   Specialty:  Internal Medicine Why:  Please call for a post hospital follow-up appointment. Contact information: [place] [address] [phone]            The results of significant diagnostics from this hospitalization (including imaging, microbiology, ancillary and laboratory) are listed below for reference.    Significant Diagnostic Studies: Mr Foot Right Wo Contrast  Addendum Date: 06/15/2018   ADDENDUM REPORT: 06/15/2018 18:01 ADDENDUM: Typographical error in the findings and impression sections. Correction is as follows Soft  tissue ulcer along the plantar aspect of the second metatarsal head. Severe osteomyelitis of the second metatarsal head. Heterogeneous T2 hyperintense signal around the second metatarsal head measuring approximately 8 mm in thickness circumferentially concerning for abscess versus phlegmon. Electronically Signed   By: Kathreen Devoid   On: 06/15/2018 18:01   Result Date: 06/15/2018 CLINICAL DATA:  Diabetic foot ulcer, chronic pain, prior fourth toe amputation EXAM: MRI OF THE RIGHT FOREFOOT WITHOUT CONTRAST TECHNIQUE: Multiplanar, multisequence MR imaging of the right forefoot was performed. No intravenous contrast was administered. COMPARISON:  None. FINDINGS: Bones/Joint/Cartilage Prior amputation of the second and fourth toes. Soft tissue ulcer along the plantar aspect of the second metatarsal head extending down to the cortical surface of the second metatarsal head. Cortical destruction of the plantar aspect of the second metatarsal head with severe marrow edema throughout the metatarsal head and shaft most consistent with osteomyelitis. Heterogeneous T2 hyperintense signal around the first metatarsal head measuring approximately 8 mm in thickness circumferentially concerning for abscess  versus phlegmon. Prior Lisfranc fracture subluxation with prior ORIF. Advanced osteoarthritis of the first tarsometatarsal joint secondary to instability with subchondral marrow edema. Lateral subluxation of the second metatarsal base relative to the middle cuneiform. Mild osteoarthritis of the second and third TMT joints. No acute fracture or dislocation. Mild osteoarthritis of the first MTP joint. Ligaments Collateral ligaments are intact. Muscles and Tendons Flexor, peroneal and extensor compartment tendons are intact. Muscles are normal. Soft tissue No fluid collection or hematoma. No soft tissue mass. Soft tissue edema along the dorsal aspect of the forefoot. IMPRESSION: 1. Soft tissue ulcer along the plantar aspect of the second metatarsal head. Severe osteomyelitis of the second metatarsal head. Heterogeneous T2 hyperintense signal around the first metatarsal head measuring approximately 8 mm in thickness circumferentially concerning for abscess versus phlegmon. 2. Prior Lisfranc fracture subluxation with prior ORIF. Advanced osteoarthritis of the first tarsometatarsal joint secondary to instability with subchondral marrow edema. Electronically Signed: By: Kathreen Devoid On: 06/13/2018 11:36   Mr Foot Left Wo Contrast  Result Date: 06/14/2018 CLINICAL DATA:  Left forefoot diabetic ulcer. EXAM: MRI OF THE LEFT FOOT WITHOUT CONTRAST TECHNIQUE: Multiplanar, multisequence MR imaging of the left forefoot was performed. No intravenous contrast was administered. COMPARISON:  Radiographs 06/08/2018. FINDINGS: Bones/Joint/Cartilage Additional sequences were obtained to minimize artifact from the screw traversing the 1st tarsometatarsal articulation. There is no evidence of acute fracture or dislocation. There is no suspicious bone marrow edema, cortical destruction or erosive change. A small effusion of the 1st metatarsophalangeal joint is present. As above, there are postsurgical changes at the 1st tarsal  metatarsal articulation. There is no subluxation or significant arthropathy at the Lisfranc joint. The visualized tarsal bones appear normal. Ligaments The Lisfranc ligament is partially obscured by the artifact, although appears intact. The collateral ligaments of the metatarsophalangeal joints appear intact. Muscles and Tendons Severe diffuse muscular fatty atrophy throughout the forefoot. The forefoot tendons are intact. No significant tenosynovitis. Soft tissues There is a soft tissue ulcer plantar to the 1st metatarsophalangeal joint which is best seen on the coronal images, measuring up to 12 mm in diameter. Between this ulcer and the flexor hallucis longus tendon, there are signal changes within the subcutaneous fat, manifesting as decreased T1 signal and heterogeneously increased T2 signal, most consistent with phlegmon. There is no well-defined fluid collection to suggest drainable abscess. As above, there is a small effusion of the 1st metatarsophalangeal joint, but no evidence of osteomyelitis involving the 1st metatarsal head,  it sesamoids or the great toe. No foreign bodies are identified. IMPRESSION: 1. Soft tissue ulcer plantar to the 1st metatarsophalangeal joint with adjacent phlegmon. No drainable abscess. 2. Small effusion of the 1st metatarsophalangeal joint without specific evidence of septic arthritis or osteomyelitis. 3. Postsurgical changes at the 1st tarsometatarsal articulation. 4. Severe forefoot muscular fatty atrophy. Electronically Signed   By: Richardean Sale M.D.   On: 06/14/2018 09:10   Dg Foot Complete Right  Result Date: 06/12/2018 CLINICAL DATA:  Diabetic with foot ulceration EXAM: RIGHT FOOT COMPLETE - 3+ VIEW COMPARISON:  Mass June 22, 2017 and right foot MR June 22, 2017 FINDINGS: Frontal, oblique, and lateral views obtained. There is postoperative change at the right first tarsal-metatarsal joint with remodeling in the distal aspect of the medial cuneiform bone. This lucency  in this area raises concern for bony destruction. There has been amputation of the second and fourth digits at the respective MTP joint levels. There is evidence of an old fracture of the distal aspect of the third metatarsal with remodeling. There is no acute fracture or dislocation. There is osteoarthritic change in the first MTP joint. No erosive changes noted. IMPRESSION: 1. Postoperative change in the first tarsal-metatarsal joint. There is apparent bony destruction involving the distal aspect of the medial cuneiform bone. There is ill-defined sequestrum between the proximal first and second metatarsals which may be reflective of the osteomyelitis in this area. 2. Previous amputations of the second and fourth digits at the MTP joint levels. Soft tissue in these areas appears normal. 3.  Prior fracture of the distal third metatarsal with remodeling. 4.  Moderate arthropathy in the first MTP joint. Electronically Signed   By: Lowella Grip III M.D.   On: 06/12/2018 12:23   Xr Foot 2 Views Left  Result Date: 06/15/2018 Radiographs of left foot show probing of wound with silver nitrate. Does not probe to bone. No bony changes concerning for osteomyelitis.  Xr Foot 2 Views Right  Result Date: 06/15/2018 Radiographs of the right foot show superficial probing of ulcer with silver nitrate. No sign of osteomyelitis adjacent to ulcer.   Microbiology: Recent Results (from the past 240 hour(s))  SARS Coronavirus 2 (CEPHEID - Performed in [facility] hospital lab), Hosp Order     Status: None   Collection Time: 06/12/18 12:00 PM  Result Value Ref Range Status   SARS Coronavirus 2 NEGATIVE NEGATIVE Final    Comment: (NOTE) If result is NEGATIVE SARS-CoV-2 target nucleic acids are NOT DETECTED. The SARS-CoV-2 RNA is generally detectable in upper and lower  respiratory specimens during the acute phase of infection. The lowest  concentration of SARS-CoV-2 viral copies this assay can detect is 250    copies / mL. A negative result does not preclude SARS-CoV-2 infection  and should not be used as the sole basis for treatment or other  patient management decisions.  A negative result may occur with  improper specimen collection / handling, submission of specimen other  than nasopharyngeal swab, presence of viral mutation(s) within the  areas targeted by this assay, and inadequate number of viral copies  (<250 copies / mL). A negative result must be combined with clinical  observations, patient history, and epidemiological information. If result is POSITIVE SARS-CoV-2 target nucleic acids are DETECTED. The SARS-CoV-2 RNA is generally detectable in upper and lower  respiratory specimens dur ing the acute phase of infection.  Positive  results are indicative of active infection with SARS-CoV-2.  Clinical  correlation  with patient history and other diagnostic information is  necessary to determine patient infection status.  Positive results do  not rule out bacterial infection or co-infection with other viruses. If result is PRESUMPTIVE POSTIVE SARS-CoV-2 nucleic acids MAY BE PRESENT.   A presumptive positive result was obtained on the submitted specimen  and confirmed on repeat testing.  While 2019 novel coronavirus  (SARS-CoV-2) nucleic acids may be present in the submitted sample  additional confirmatory testing may be necessary for epidemiological  and / or clinical management purposes  to differentiate between  SARS-CoV-2 and other Sarbecovirus currently known to infect humans.  If clinically indicated additional testing with an alternate test  methodology [phone]) is advised. The SARS-CoV-2 RNA is generally  detectable in upper and lower respiratory sp ecimens during the acute  phase of infection. The expected result is Negative. Fact Sheet for Patients:  StrictlyIdeas.no Fact Sheet for Healthcare  Providers: BankingDealers.co.za This test is not yet approved or cleared by the [place] FDA and has been authorized for detection and/or diagnosis of SARS-CoV-2 by FDA under an Emergency Use Authorization (EUA).  This EUA will remain in effect (meaning this test can be used) for the duration of the COVID-19 declaration under Section 564(b)(1) of the Act, 21 U.S.C. section 360bbb-3(b)(1), unless the authorization is terminated or revoked sooner. Performed at [facility], [address], [place], [place] [address]   Surgical pcr screen     Status: None   Collection Time: 06/14/18  4:55 PM  Result Value Ref Range Status   MRSA, PCR NEGATIVE NEGATIVE Final   Staphylococcus aureus NEGATIVE NEGATIVE Final    Comment: (NOTE) The Xpert SA Assay (FDA approved for NASAL specimens in patients 45 years of age and older), is one component of a comprehensive surveillance program. It is not intended to diagnose infection nor to guide or monitor treatment. Performed at [facility], [place] [address] [place], [place] [address]      Labs: Basic Metabolic Panel: Recent Labs  Lab 06/12/18 1220 06/13/18 0239 06/14/18 0204 06/15/18 0533 06/17/18 0517  NA 134* 138 139 139 142  K 3.8 3.7 3.8 3.9 4.0  CL 96* 103 105 107 107  CO2 25 26 26 25 25   GLUCOSE 174* 142* 162* 138* 114*  BUN 13 14 12 9 8   CREATININE 0.90 0.94 0.81 0.81 0.79  CALCIUM 9.1 8.6* 8.9 9.0 8.8*  MG  --   --  1.9 1.9  --    Liver Function Tests: Recent Labs  Lab 06/12/18 1220 06/14/18 0204 06/15/18 0533  AST 19 19 27   ALT 17 14 19   ALKPHOS 95 75 77  BILITOT 0.6 0.2* 0.5  PROT 7.2 5.4* 5.6*  ALBUMIN 3.9 2.8* 2.9*   No results for input(s): LIPASE, AMYLASE in the last 168 hours. No results for input(s): AMMONIA in the last 168 hours. CBC: Recent Labs  Lab 06/12/18 1220 06/13/18 0239 06/14/18 0204 06/15/18 0533 06/16/18 0758 06/17/18 0517  WBC 8.9 6.0 6.9 5.6  --  7.7   NEUTROABS 5.6  --   --   --   --   --   HGB 13.5 11.7* 12.0* 12.2* 12.0* 13.0  HCT 41.5 36.3* 37.1* 36.9* 36.4* 39.0  MCV 84.2 83.1 83.2 82.6  --  83.2  PLT 236 225 241 237  --  232   Cardiac Enzymes: No results for input(s): CKTOTAL, CKMB, CKMBINDEX, TROPONINI in the last 168 hours. BNP: BNP (last 3 results) No results  for input(s): BNP in the last 8760 hours.  ProBNP (last 3 results) No results for input(s): PROBNP in the last 8760 hours.  CBG: Recent Labs  Lab 06/16/18 0743 06/16/18 1159 06/16/18 1613 06/16/18 2131 06/17/18 0750  GLUCAP 210* 247* 188* 113* 107*       Signed:  Kayleen Memos, MD [facility] 06/17/2018, 11:14 AM

## 2018-06-19 ENCOUNTER — Telehealth: Payer: Self-pay | Admitting: Adult Health

## 2018-06-21 ENCOUNTER — Telehealth: Payer: Self-pay | Admitting: Radiology

## 2018-06-21 NOTE — Telephone Encounter (Signed)
[facility] called. [facility], they did evaluation today. Requesting order frequency 2x week for 3 weeks, 1x week for 2 weeks, for safety education, home exercises, safety fall prevention.  CB # [phone]

## 2018-06-22 NOTE — Telephone Encounter (Signed)
Ben was called and LVM to continue [facility] and PT for patient.

## 2018-06-23 ENCOUNTER — Encounter: Payer: Self-pay | Admitting: Orthopedic Surgery

## 2018-06-23 ENCOUNTER — Telehealth: Payer: Self-pay

## 2018-06-23 ENCOUNTER — Other Ambulatory Visit: Payer: Self-pay

## 2018-06-23 ENCOUNTER — Ambulatory Visit (INDEPENDENT_AMBULATORY_CARE_PROVIDER_SITE_OTHER): Payer: Medicare Other | Admitting: Physician Assistant

## 2018-06-23 VITALS — Ht 75.0 in | Wt 185.0 lb

## 2018-06-23 DIAGNOSIS — E440 Moderate protein-calorie malnutrition: Secondary | ICD-10-CM

## 2018-06-23 DIAGNOSIS — Z794 Long term (current) use of insulin: Secondary | ICD-10-CM

## 2018-06-23 DIAGNOSIS — E1142 Type 2 diabetes mellitus with diabetic polyneuropathy: Secondary | ICD-10-CM

## 2018-06-23 DIAGNOSIS — L97521 Non-pressure chronic ulcer of other part of left foot limited to breakdown of skin: Secondary | ICD-10-CM

## 2018-06-23 DIAGNOSIS — Z89431 Acquired absence of right foot: Secondary | ICD-10-CM

## 2018-06-23 MED ORDER — METHOCARBAMOL 500 MG PO TABS: 500.0000 mg | ORAL_TABLET | Freq: Three times a day (TID) | ORAL | 0 refills | Status: DC | PRN

## 2018-06-23 NOTE — Telephone Encounter (Signed)
Patient called concerning Rx for Robaxin.   Advised patient that Rx for Robaxin was sent to [facility] in [place].

## 2018-06-24 ENCOUNTER — Telehealth: Payer: Self-pay

## 2018-06-24 NOTE — Telephone Encounter (Signed)
[facility] called and states that she wen tout to see the pt today and that his dressing had come off of his foot. Advised that she had applied a dry dressing and wanted to know if there were wound care orders for the pt. Advised that he can just apply a dry dressing change daily and remain non weight bearing on this foot or weight bear through the heel and he has a follow up in the office on 06/30/18 will call with any changes.

## 2018-06-26 ENCOUNTER — Encounter: Payer: Self-pay | Admitting: Physician Assistant

## 2018-06-26 NOTE — Progress Notes (Signed)
Office Visit Note   Patient: [name]           Date of Birth: 01/21/1965           MRN: [id number] Visit Date: 06/23/2018              Requested by: Lucia Gaskins, MD [address] [place], [place] [address] PCP: Lucia Gaskins, MD  Chief Complaint  Patient presents with  . Right Foot - Routine Post Op    06/15/18 right foot transmet  Wound vac      HPI: The patient is a 53 year old gentleman who is seen for follow-up of a right transmetatarsal amputation on 06/15/2018.  He had the [facility] in place and this was removed today.  He reports that there were no problems with the VAC.  He has been weightbearing as tolerated in a postop shoe and ambulating with a cane.  He also has a ulcer over the first metatarsal of the left foot and has been utilizing a dry dressing there.  He is working with his primary care physician with management of his pain.  We discussed that we would not be refilling narcotic pain medication.  He did ask for a Robaxin refill and this was completed.  Assessment & Plan: Visit Diagnoses:  1. History of transmetatarsal amputation of right foot ([place])   2. Type 2 diabetes mellitus with diabetic polyneuropathy, with long-term current use of insulin ([place])   3. Ulcer of left foot, limited to breakdown of skin ([place])   4. Moderate protein malnutrition (HCC)     Plan: The Praveena VAC was removed.  He can begin washing the area with soap and water daily and apply a dry dressing and Ace wrap.  He should offload the area as much as possible and elevate as much as possible.  After informed consent the left foot first metatarsal head ulceration was debrided with a #10 blade knife and the patient tolerated this well.  Will begin doxycycline 100 mg p.o. twice daily.  Robaxin was refilled.  He will follow-up in 1 week.  Follow-Up Instructions: Return in about 1 week (around 06/30/2018).   Ortho Exam  Patient is alert, oriented, no adenopathy, well-dressed,  normal affect, normal respiratory effort. The right foot transmetatarsal amputation site, Praveena VAC was removed and he has moderate swelling and maceration over the inferior flap.  There is some minimal serosanguineous drainage.  The sutures are under tension.  He has palpable pedal pulses. The left foot first metatarsal head ulceration was debrided with a #10 blade knife after informed consent of the patient tolerated this well.  The ulcer is approximately 2-1/2 to 3 cm diameter and 5 mm of depth without exposed bone or tendon.  Imaging: No results found. No images are attached to the encounter.  Labs: Lab Results  Component Value Date   HGBA1C 6.2 (H) 06/13/2018   HGBA1C 6.7 (H) 06/07/2017   HGBA1C 10.3 (H) 08/17/2016   ESRSEDRATE 30 (H) 06/13/2018   ESRSEDRATE 5 06/04/2017   ESRSEDRATE 110 (H) 12/19/2013   CRP 5.7 (H) 06/13/2018   CRP <0.8 06/05/2017   CRP 12.2 (H) 12/19/2013   REPTSTATUS 12/24/2013 FINAL 12/18/2013   REPTSTATUS 12/24/2013 FINAL 12/18/2013   GRAMSTAIN  04/19/2011    WBC PRESENT,BOTH PMN AND MONONUCLEAR NO ORGANISMS SEEN   CULT  12/18/2013    NO GROWTH 5 DAYS Performed at [facility]  12/18/2013    NO GROWTH 5 DAYS  Performed at [facility] Results  Component Value Date   ALBUMIN 2.9 (L) 06/15/2018   ALBUMIN 2.8 (L) 06/14/2018   ALBUMIN 3.9 06/12/2018    Body mass index is 23.12 kg/m.  Orders:  No orders of the defined types were placed in this encounter.  Meds ordered this encounter  Medications  . methocarbamol (ROBAXIN) 500 MG tablet    Sig: Take 1 tablet (500 mg total) by mouth every 8 (eight) hours as needed for muscle spasms.    Dispense:  20 tablet    Refill:  0     Procedures: No procedures performed  Clinical Data: No additional findings.  ROS:  All other systems negative, except as noted in the HPI. Review of Systems  Objective: Vital Signs: Ht 6\' 3"  (1.905 m)   Wt 185 lb (83.9 kg)    BMI 23.12 kg/m   Specialty Comments:  No specialty comments available.  PMFS History: Patient Active Problem List   Diagnosis Date Noted  . Osteomyelitis of right foot ([place])   . Cellulitis of right foot   . Charcot foot due to diabetes mellitus ([place])   . Cellulitis 06/12/2018  . H/O amputation of lesser toe, right ([place]) 07/29/2017  . Diabetic foot infection ([place]) 06/22/2017  . Post op infection 06/22/2017  . SVT (supraventricular tachycardia) ([place]) 08/17/2016  . Tobacco use disorder 10/25/2014  . Diabetes mellitus type 2 in nonobese (HCC)   . Swelling   . Type 2 diabetes mellitus with left diabetic foot ulcer ([place]) 12/18/2013  . Diabetes mellitus due to underlying condition with foot ulcer ([place]) 12/18/2013  . Left leg cellulitis 12/18/2013  . Cocaine abuse ([place]) 03/29/2012  . Generalized anxiety disorder 03/29/2012  . Panic attacks 03/29/2012  . Alcohol dependence ([place]) 08/05/2011    Class: Chronic  . Substance abuse ([place]) 05/31/2011  . Ulcer of left foot, limited to breakdown of skin ([place]) 02/15/2011  . Neuropathic ulcer of foot ([place]) 02/15/2011   Past Medical History:  Diagnosis Date  . Anxiety   . Arthritis    "everywhere" (04/21/2012)  . Cellulitis and abscess of foot 12/19/2013   LEFT FOOT  . Chronic pain   . DDD (degenerative disc disease)   . Depression   . Diabetes mellitus without complication (HCC)    borderline  . Diabetic foot ulcer ([place])   . ETOH abuse   . Full dentures   . GERD (gastroesophageal reflux disease)    tums  . History of blood transfusion    "related to left knee OR; probably right hip too" (04/21/2012)  . Mental disorder   . Neuromuscular disorder (HCC)    neuropathy  . Noncompliance   . Open wound    bottom of foot  . Osteomyelitis ([place])    right 4th toe  . Pneumonia ~ 2012  . Polysubstance abuse (HCC)    etoh, cocaine, heroin  . Stroke [facility]) 2008   "they said I might have had one during right hip replacement" (04/21/2012)  .  Wears glasses     Family History  Problem Relation Age of Onset  . Diabetes Father     Past Surgical History:  Procedure Laterality Date  . AMPUTATION Right 06/09/2017   Procedure: RIGHT 2ND TOE AMPUTATION;  Surgeon: [name], MD;  Location: [facility] [place];  Service: Orthopedics;  Laterality: Right;  . AMPUTATION Right 07/23/2017   Procedure: RIGHT 4TH TOE AMPUTATION;  Surgeon: [name], MD;  Location: [place];  Service: Orthopedics;  Laterality: Right;  . AMPUTATION Right 06/15/2018   Procedure: RIGHT FOOT TRANSMETATARSAL AMPUTATION;  Surgeon: [name], MD;  Location: [facility];  Service: Orthopedics;  Laterality: Right;  right  . JOINT REPLACEMENT    . KNEE ARTHROSCOPY Bilateral 1980's/1990's  . LUNG LOBECTOMY Left ~ 2006  . LUNG LOBECTOMY    . METATARSAL OSTEOTOMY  10/29/2011   Procedure: METATARSAL OSTEOTOMY;  Surgeon: [name], MD;  Location: [facility] [place];  Service: Orthopedics;  Laterality: Left;  Left 1st Metatarsal Dorsal Closing Wedge   . METATARSAL OSTEOTOMY Right 04/28/2017   Procedure: RIGHT 1ST METATARSAL DORSAL CLOSING WEDGE OSTEOTOMY;  Surgeon: [name], MD;  Location: [facility];  Service: Orthopedics;  Laterality: Right;  . MULTIPLE TOOTH EXTRACTIONS    . REVISION TOTAL HIP ARTHROPLASTY Right 2008   "4-5 months after replacement" (04/21/2012)  . TOTAL HIP ARTHROPLASTY Right 2008  . TOTAL KNEE ARTHROPLASTY Left 2006  . TOTAL KNEE ARTHROPLASTY Right 04/20/2012  . TOTAL KNEE ARTHROPLASTY Right 04/20/2012   Procedure: TOTAL KNEE ARTHROPLASTY;  Surgeon: [name], MD;  Location: [facility];  Service: Orthopedics;  Laterality: Right;  Right Total Knee Arthroplasty   Social History   Occupational History  . Not on file  Tobacco Use  . Smoking status: Current Every Day Smoker    Packs/day: 1.00    Years: 30.00    Pack years: 30.00    Types: Cigarettes  . Smokeless tobacco: Never Used  Substance and Sexual Activity  . Alcohol use: Not Currently    Alcohol/week: 0.0 standard  drinks    Frequency: Never  . Drug use: Not Currently    Types: Cocaine    Comment: Cocaine and heroin - 10 years ago  . Sexual activity: Yes    Birth control/protection: None

## 2018-06-29 ENCOUNTER — Telehealth: Payer: Self-pay | Admitting: Orthopedic Surgery

## 2018-06-29 NOTE — Telephone Encounter (Signed)
Jessica/[facility]/ order for right foot wound care  Please call Janett Billow @ [phone]

## 2018-06-29 NOTE — Telephone Encounter (Signed)
Advised per the last office visit pt is to wash area with soap and water and apply a dry dressing daily. sw HHN Jessica to call with questions.

## 2018-06-30 ENCOUNTER — Encounter: Payer: Self-pay | Admitting: Orthopedic Surgery

## 2018-06-30 ENCOUNTER — Ambulatory Visit (INDEPENDENT_AMBULATORY_CARE_PROVIDER_SITE_OTHER): Payer: Medicare Other | Admitting: Physician Assistant

## 2018-06-30 ENCOUNTER — Other Ambulatory Visit: Payer: Self-pay

## 2018-06-30 VITALS — Ht 75.0 in | Wt 185.0 lb

## 2018-06-30 DIAGNOSIS — Z794 Long term (current) use of insulin: Secondary | ICD-10-CM

## 2018-06-30 DIAGNOSIS — E1142 Type 2 diabetes mellitus with diabetic polyneuropathy: Secondary | ICD-10-CM

## 2018-06-30 DIAGNOSIS — L97521 Non-pressure chronic ulcer of other part of left foot limited to breakdown of skin: Secondary | ICD-10-CM

## 2018-06-30 DIAGNOSIS — E440 Moderate protein-calorie malnutrition: Secondary | ICD-10-CM

## 2018-06-30 DIAGNOSIS — Z89431 Acquired absence of right foot: Secondary | ICD-10-CM

## 2018-06-30 MED ORDER — METHOCARBAMOL 500 MG PO TABS: 500.0000 mg | ORAL_TABLET | Freq: Three times a day (TID) | ORAL | 0 refills | Status: DC | PRN

## 2018-06-30 MED ORDER — SILVER SULFADIAZINE 1 % EX CREA: 1.0000 "application " | TOPICAL_CREAM | Freq: Every day | CUTANEOUS | 0 refills | Status: DC

## 2018-06-30 MED ORDER — SULFAMETHOXAZOLE-TRIMETHOPRIM 800-160 MG PO TABS: 1.0000 | ORAL_TABLET | Freq: Two times a day (BID) | ORAL | 0 refills | Status: DC

## 2018-06-30 MED ORDER — OXYCODONE-ACETAMINOPHEN 5-325 MG PO TABS: 1.0000 | ORAL_TABLET | Freq: Three times a day (TID) | ORAL | 0 refills | Status: DC | PRN

## 2018-06-30 NOTE — Progress Notes (Signed)
Office Visit Note   Patient: [name]           Date of Birth: May 27, 1965           MRN: [id number] Visit Date: 06/30/2018              Requested by: Lucia Gaskins, MD [address] [place],  [place] [address] PCP: Lucia Gaskins, MD  Chief Complaint  Patient presents with  . Right Foot - Routine Post Op    06/15/18 right foot transmet amputation       HPI: The patient is a 53 yo gentleman who is seen for post operative follow up following right foot transmetatarsal amputation on 06/15/2018. He reports some yellow drainage and redness and swelling of the residual foot. He is on Doxycycline. He is walking with a post op shoe. He also has a ulcer over the left plantar foot over the 1st metatarsal head.   Assessment & Plan: Visit Diagnoses:  1. History of transmetatarsal amputation of right foot ([place])   2. Type 2 diabetes mellitus with diabetic polyneuropathy, with long-term current use of insulin ([place])   3. Ulcer of left foot, limited to breakdown of skin ([place])   4. Moderate protein malnutrition (HCC)     Plan: Change antibiotic to Bactrim DS 1 po BID x 14 days.  We provided a vive shrinker stocking for him to use over the right transmetatarsal amputation around the clock except for daily cleaning of the residual foot with soap and water. Also begin silvadene cream to the left foot ulcer daily after cleaning.  Refilled Percocet and Robaxin. Follow up in 1 week.   Follow-Up Instructions: Return in about 1 week (around 07/07/2018).   Ortho Exam  Patient is alert, oriented, no adenopathy, well-dressed, normal affect, normal respiratory effort. The right transmetatarsal amputation site has increased swelling, erythema about the distal foot and serosanguineous appearing drainage. Palpable dorsalis pedis pulse. We provided a Vive shrinker and donned for the patient to the right TMA and instructed to wear around the clock except for showering. After informed consent, the left  foot 1st MT head plantar ulcer was debrided to healthy viable bleeding tissue and hemostasis was achieved with silver nitrate. There are no signs of infection or cellulitis of the left foot. Palpable dorsalis pedis pulse.   Imaging: No results found. No images are attached to the encounter.  Labs: Lab Results  Component Value Date   HGBA1C 6.2 (H) 06/13/2018   HGBA1C 6.7 (H) 06/07/2017   HGBA1C 10.3 (H) 08/17/2016   ESRSEDRATE 30 (H) 06/13/2018   ESRSEDRATE 5 06/04/2017   ESRSEDRATE 110 (H) 12/19/2013   CRP 5.7 (H) 06/13/2018   CRP <0.8 06/05/2017   CRP 12.2 (H) 12/19/2013   REPTSTATUS 12/24/2013 FINAL 12/18/2013   REPTSTATUS 12/24/2013 FINAL 12/18/2013   GRAMSTAIN  04/19/2011    WBC PRESENT,BOTH PMN AND MONONUCLEAR NO ORGANISMS SEEN   CULT  12/18/2013    NO GROWTH 5 DAYS Performed at [facility]  12/18/2013    NO GROWTH 5 DAYS Performed at [facility]      Lab Results  Component Value Date   ALBUMIN 2.9 (L) 06/15/2018   ALBUMIN 2.8 (L) 06/14/2018   ALBUMIN 3.9 06/12/2018    Body mass index is 23.12 kg/m.  Orders:  No orders of the defined types were placed in this encounter.  Meds ordered this encounter  Medications  . oxyCODONE-acetaminophen (PERCOCET/ROXICET) 5-325 MG tablet  Sig: Take 1 tablet by mouth every 8 (eight) hours as needed for moderate pain or severe pain.    Dispense:  21 tablet    Refill:  0  . silver sulfADIAZINE (SILVADENE) 1 % cream    Sig: Apply 1 application topically daily.    Dispense:  50 g    Refill:  0  . sulfamethoxazole-trimethoprim (BACTRIM DS) 800-160 MG tablet    Sig: Take 1 tablet by mouth 2 (two) times daily.    Dispense:  28 tablet    Refill:  0  . methocarbamol (ROBAXIN) 500 MG tablet    Sig: Take 1 tablet (500 mg total) by mouth every 8 (eight) hours as needed for muscle spasms.    Dispense:  20 tablet    Refill:  0     Procedures: No procedures performed  Clinical Data: No  additional findings.  ROS:  All other systems negative, except as noted in the HPI. Review of Systems  Objective: Vital Signs: Ht 6\' 3"  (1.905 m)   Wt 185 lb (83.9 kg)   BMI 23.12 kg/m   Specialty Comments:  No specialty comments available.  PMFS History: Patient Active Problem List   Diagnosis Date Noted  . Osteomyelitis of right foot ([place])   . Cellulitis of right foot   . Charcot foot due to diabetes mellitus ([place])   . Cellulitis 06/12/2018  . H/O amputation of lesser toe, right ([place]) 07/29/2017  . Diabetic foot infection ([place]) 06/22/2017  . Post op infection 06/22/2017  . SVT (supraventricular tachycardia) ([place]) 08/17/2016  . Tobacco use disorder 10/25/2014  . Diabetes mellitus type 2 in nonobese (HCC)   . Swelling   . Type 2 diabetes mellitus with left diabetic foot ulcer ([place]) 12/18/2013  . Diabetes mellitus due to underlying condition with foot ulcer ([place]) 12/18/2013  . Left leg cellulitis 12/18/2013  . Cocaine abuse ([place]) 03/29/2012  . Generalized anxiety disorder 03/29/2012  . Panic attacks 03/29/2012  . Alcohol dependence ([place]) 08/05/2011    Class: Chronic  . Substance abuse ([place]) 05/31/2011  . Ulcer of left foot, limited to breakdown of skin ([place]) 02/15/2011  . Neuropathic ulcer of foot ([place]) 02/15/2011   Past Medical History:  Diagnosis Date  . Anxiety   . Arthritis    "everywhere" (04/21/2012)  . Cellulitis and abscess of foot 12/19/2013   LEFT FOOT  . Chronic pain   . DDD (degenerative disc disease)   . Depression   . Diabetes mellitus without complication (HCC)    borderline  . Diabetic foot ulcer ([place])   . ETOH abuse   . Full dentures   . GERD (gastroesophageal reflux disease)    tums  . History of blood transfusion    "related to left knee OR; probably right hip too" (04/21/2012)  . Mental disorder   . Neuromuscular disorder (HCC)    neuropathy  . Noncompliance   . Open wound    bottom of foot  . Osteomyelitis ([place])    right 4th toe  .  Pneumonia ~ 2012  . Polysubstance abuse (HCC)    etoh, cocaine, heroin  . Stroke [facility]) 2008   "they said I might have had one during right hip replacement" (04/21/2012)  . Wears glasses     Family History  Problem Relation Age of Onset  . Diabetes Father     Past Surgical History:  Procedure Laterality Date  . AMPUTATION Right 06/09/2017   Procedure: RIGHT 2ND TOE AMPUTATION;  Surgeon: Sharol Given,  Illene Regulus, MD;  Location: [place];  Service: Orthopedics;  Laterality: Right;  . AMPUTATION Right 07/23/2017   Procedure: RIGHT 4TH TOE AMPUTATION;  Surgeon: [name], MD;  Location: [place];  Service: Orthopedics;  Laterality: Right;  . AMPUTATION Right 06/15/2018   Procedure: RIGHT FOOT TRANSMETATARSAL AMPUTATION;  Surgeon: [name], MD;  Location: [facility];  Service: Orthopedics;  Laterality: Right;  right  . JOINT REPLACEMENT    . KNEE ARTHROSCOPY Bilateral 1980's/1990's  . LUNG LOBECTOMY Left ~ 2006  . LUNG LOBECTOMY    . METATARSAL OSTEOTOMY  10/29/2011   Procedure: METATARSAL OSTEOTOMY;  Surgeon: [name], MD;  Location: [facility];  Service: Orthopedics;  Laterality: Left;  Left 1st Metatarsal Dorsal Closing Wedge   . METATARSAL OSTEOTOMY Right 04/28/2017   Procedure: RIGHT 1ST METATARSAL DORSAL CLOSING WEDGE OSTEOTOMY;  Surgeon: [name], MD;  Location: [facility];  Service: Orthopedics;  Laterality: Right;  . MULTIPLE TOOTH EXTRACTIONS    . REVISION TOTAL HIP ARTHROPLASTY Right 2008   "4-5 months after replacement" (04/21/2012)  . TOTAL HIP ARTHROPLASTY Right 2008  . TOTAL KNEE ARTHROPLASTY Left 2006  . TOTAL KNEE ARTHROPLASTY Right 04/20/2012  . TOTAL KNEE ARTHROPLASTY Right 04/20/2012   Procedure: TOTAL KNEE ARTHROPLASTY;  Surgeon: [name], MD;  Location: [facility];  Service: Orthopedics;  Laterality: Right;  Right Total Knee Arthroplasty   Social History   Occupational History  . Not on file  Tobacco Use  . Smoking status: Current Every Day Smoker    Packs/day: 1.00    Years:  30.00    Pack years: 30.00    Types: Cigarettes  . Smokeless tobacco: Never Used  Substance and Sexual Activity  . Alcohol use: Not Currently    Alcohol/week: 0.0 standard drinks    Frequency: Never  . Drug use: Not Currently    Types: Cocaine    Comment: Cocaine and heroin - 10 years ago  . Sexual activity: Yes    Birth control/protection: None

## 2018-07-01 ENCOUNTER — Encounter: Payer: Self-pay | Admitting: Physician Assistant

## 2018-07-07 ENCOUNTER — Other Ambulatory Visit: Payer: Self-pay

## 2018-07-07 ENCOUNTER — Ambulatory Visit (INDEPENDENT_AMBULATORY_CARE_PROVIDER_SITE_OTHER): Payer: Medicare Other | Admitting: Orthopedic Surgery

## 2018-07-07 ENCOUNTER — Encounter: Payer: Self-pay | Admitting: Orthopedic Surgery

## 2018-07-07 VITALS — Ht 75.0 in | Wt 185.0 lb

## 2018-07-07 DIAGNOSIS — L97521 Non-pressure chronic ulcer of other part of left foot limited to breakdown of skin: Secondary | ICD-10-CM

## 2018-07-07 DIAGNOSIS — E1142 Type 2 diabetes mellitus with diabetic polyneuropathy: Secondary | ICD-10-CM

## 2018-07-07 DIAGNOSIS — Z89431 Acquired absence of right foot: Secondary | ICD-10-CM

## 2018-07-07 DIAGNOSIS — F17200 Nicotine dependence, unspecified, uncomplicated: Secondary | ICD-10-CM

## 2018-07-07 DIAGNOSIS — Z794 Long term (current) use of insulin: Secondary | ICD-10-CM

## 2018-07-07 MED ORDER — OXYCODONE-ACETAMINOPHEN 5-325 MG PO TABS: 1.0000 | ORAL_TABLET | Freq: Three times a day (TID) | ORAL | 0 refills | Status: DC | PRN

## 2018-07-07 MED ORDER — METHOCARBAMOL 500 MG PO TABS: 500.0000 mg | ORAL_TABLET | Freq: Three times a day (TID) | ORAL | 0 refills | Status: DC | PRN

## 2018-07-07 NOTE — Progress Notes (Signed)
Office Visit Note   Patient: [name]           Date of Birth: September 07, 1965           MRN: [id number] Visit Date: 07/07/2018              Requested by: Lucia Gaskins, MD [address] [place],  [place] [address] PCP: Lucia Gaskins, MD  Chief Complaint  Patient presents with  . Right Foot - Routine Post Op    06/15/18 right foot TMA      HPI: Patient is a 53 year old gentleman who presents 3 weeks status post right transmetatarsal amputation.  Patient is currently on Bactrim DS and has 1 week remaining.  He is wearing a stump shrinker on the right foot full weightbearing with a cane has an ulceration beneath the first metatarsal left foot and is still smoking.  Assessment & Plan: Visit Diagnoses:  1. Type 2 diabetes mellitus with diabetic polyneuropathy, with long-term current use of insulin ([place])   2. History of transmetatarsal amputation of right foot ([place])   3. Ulcer of left foot, limited to breakdown of skin ([place])   4. Smoking     Plan: Recommended minimizing weightbearing elevation and compression for the right foot continue the antibiotics left foot continue wound care also minimize weightbearing.  Smoking cessation discussed.  Prescription called in for Percocet and Robaxin.  Follow-Up Instructions: Return in about 1 week (around 07/14/2018).   Ortho Exam  Patient is alert, oriented, no adenopathy, well-dressed, normal affect, normal respiratory effort. Examination patient has a Wegner grade 1 ulcer beneath the left great toe first metatarsal head.  This has healthy granulation tissue he does have a cavovarus foot with plantarflexed first ray.  Examination the right foot he has increased swelling and cellulitis 2 cm proximal to the incision.  The incision has healed sutures are harvested.  Imaging: No results found. No images are attached to the encounter.  Labs: Lab Results  Component Value Date   HGBA1C 6.2 (H) 06/13/2018   HGBA1C 6.7 (H) 06/07/2017   HGBA1C 10.3 (H) 08/17/2016   ESRSEDRATE 30 (H) 06/13/2018   ESRSEDRATE 5 06/04/2017   ESRSEDRATE 110 (H) 12/19/2013   CRP 5.7 (H) 06/13/2018   CRP <0.8 06/05/2017   CRP 12.2 (H) 12/19/2013   REPTSTATUS 12/24/2013 FINAL 12/18/2013   REPTSTATUS 12/24/2013 FINAL 12/18/2013   GRAMSTAIN  04/19/2011    WBC PRESENT,BOTH PMN AND MONONUCLEAR NO ORGANISMS SEEN   CULT  12/18/2013    NO GROWTH 5 DAYS Performed at   12/18/2013    NO GROWTH 5 DAYS Performed at [facility]      Lab Results  Component Value Date   ALBUMIN 2.9 (L) 06/15/2018   ALBUMIN 2.8 (L) 06/14/2018   ALBUMIN 3.9 06/12/2018    Body mass index is 23.12 kg/m.  Orders:  No orders of the defined types were placed in this encounter.  Meds ordered this encounter  Medications  . oxyCODONE-acetaminophen (PERCOCET/ROXICET) 5-325 MG tablet    Sig: Take 1 tablet by mouth every 8 (eight) hours as needed for moderate pain or severe pain.    Dispense:  21 tablet    Refill:  0  . methocarbamol (ROBAXIN) 500 MG tablet    Sig: Take 1 tablet (500 mg total) by mouth every 8 (eight) hours as needed for muscle spasms.    Dispense:  20 tablet    Refill:  0  Procedures: No procedures performed  Clinical Data: No additional findings.  ROS:  All other systems negative, except as noted in the HPI. Review of Systems  Objective: Vital Signs: Ht 6\' 3"  (1.905 m)   Wt 185 lb (83.9 kg)   BMI 23.12 kg/m   Specialty Comments:  No specialty comments available.  PMFS History: Patient Active Problem List   Diagnosis Date Noted  . Osteomyelitis of right foot ([place])   . Cellulitis of right foot   . Charcot foot due to diabetes mellitus ([place])   . Cellulitis 06/12/2018  . H/O amputation of lesser toe, right ([place]) 07/29/2017  . Diabetic foot infection ([place]) 06/22/2017  . Post op infection 06/22/2017  . SVT (supraventricular tachycardia) ([place]) 08/17/2016  . Tobacco use disorder 10/25/2014   . Diabetes mellitus type 2 in nonobese (HCC)   . Swelling   . Type 2 diabetes mellitus with left diabetic foot ulcer ([place]) 12/18/2013  . Diabetes mellitus due to underlying condition with foot ulcer ([place]) 12/18/2013  . Left leg cellulitis 12/18/2013  . Cocaine abuse ([place]) 03/29/2012  . Generalized anxiety disorder 03/29/2012  . Panic attacks 03/29/2012  . Alcohol dependence ([place]) 08/05/2011    Class: Chronic  . Substance abuse ([place]) 05/31/2011  . Ulcer of left foot, limited to breakdown of skin ([place]) 02/15/2011  . Neuropathic ulcer of foot ([place]) 02/15/2011   Past Medical History:  Diagnosis Date  . Anxiety   . Arthritis    "everywhere" (04/21/2012)  . Cellulitis and abscess of foot 12/19/2013   LEFT FOOT  . Chronic pain   . DDD (degenerative disc disease)   . Depression   . Diabetes mellitus without complication (HCC)    borderline  . Diabetic foot ulcer ([place])   . ETOH abuse   . Full dentures   . GERD (gastroesophageal reflux disease)    tums  . History of blood transfusion    "related to left knee OR; probably right hip too" (04/21/2012)  . Mental disorder   . Neuromuscular disorder (HCC)    neuropathy  . Noncompliance   . Open wound    bottom of foot  . Osteomyelitis ([place])    right 4th toe  . Pneumonia ~ 2012  . Polysubstance abuse (HCC)    etoh, cocaine, heroin  . Stroke [facility]) 2008   "they said I might have had one during right hip replacement" (04/21/2012)  . Wears glasses     Family History  Problem Relation Age of Onset  . Diabetes Father     Past Surgical History:  Procedure Laterality Date  . AMPUTATION Right 06/09/2017   Procedure: RIGHT 2ND TOE AMPUTATION;  Surgeon: [name], MD;  Location: [facility];  Service: Orthopedics;  Laterality: Right;  . AMPUTATION Right 07/23/2017   Procedure: RIGHT 4TH TOE AMPUTATION;  Surgeon: [name], MD;  Location: [facility];  Service: Orthopedics;  Laterality: Right;  . AMPUTATION Right 06/15/2018   Procedure: RIGHT  FOOT TRANSMETATARSAL AMPUTATION;  Surgeon: [name], MD;  Location: [facility];  Service: Orthopedics;  Laterality: Right;  right  . JOINT REPLACEMENT    . KNEE ARTHROSCOPY Bilateral 1980's/1990's  . LUNG LOBECTOMY Left ~ 2006  . LUNG LOBECTOMY    . METATARSAL OSTEOTOMY  10/29/2011   Procedure: METATARSAL OSTEOTOMY;  Surgeon: [name], MD;  Location: [facility];  Service: Orthopedics;  Laterality: Left;  Left 1st Metatarsal Dorsal Closing Wedge   . METATARSAL OSTEOTOMY Right 04/28/2017   Procedure:  RIGHT 1ST METATARSAL DORSAL CLOSING WEDGE OSTEOTOMY;  Surgeon: [name], MD;  Location: [facility];  Service: Orthopedics;  Laterality: Right;  . MULTIPLE TOOTH EXTRACTIONS    . REVISION TOTAL HIP ARTHROPLASTY Right 2008   "4-5 months after replacement" (04/21/2012)  . TOTAL HIP ARTHROPLASTY Right 2008  . TOTAL KNEE ARTHROPLASTY Left 2006  . TOTAL KNEE ARTHROPLASTY Right 04/20/2012  . TOTAL KNEE ARTHROPLASTY Right 04/20/2012   Procedure: TOTAL KNEE ARTHROPLASTY;  Surgeon: [name], MD;  Location: [facility];  Service: Orthopedics;  Laterality: Right;  Right Total Knee Arthroplasty   Social History   Occupational History  . Not on file  Tobacco Use  . Smoking status: Current Every Day Smoker    Packs/day: 1.00    Years: 30.00    Pack years: 30.00    Types: Cigarettes  . Smokeless tobacco: Never Used  Substance and Sexual Activity  . Alcohol use: Not Currently    Alcohol/week: 0.0 standard drinks    Frequency: Never  . Drug use: Not Currently    Types: Cocaine    Comment: Cocaine and heroin - 10 years ago  . Sexual activity: Yes    Birth control/protection: None

## 2018-07-08 ENCOUNTER — Telehealth: Payer: Self-pay | Admitting: Orthopedic Surgery

## 2018-07-08 NOTE — Telephone Encounter (Signed)
I called and sw Verdis Frederickson and advised that the pt should continue with his compression sock and maintain direct contact with the skin ( no dressing needed underneath) the sock is impregnated with a nano silver and copper and this is the dressing for the transmet amputation. To call with questions.

## 2018-07-08 NOTE — Telephone Encounter (Signed)
Received voicemail messagel from [facility] with [place] needing orders for dressing for patient's right foot. The number to contact Verdis Frederickson is [phone]

## 2018-07-14 ENCOUNTER — Encounter: Payer: Self-pay | Admitting: Orthopedic Surgery

## 2018-07-14 ENCOUNTER — Other Ambulatory Visit: Payer: Self-pay

## 2018-07-14 ENCOUNTER — Other Ambulatory Visit: Payer: Self-pay | Admitting: Physician Assistant

## 2018-07-14 ENCOUNTER — Ambulatory Visit (INDEPENDENT_AMBULATORY_CARE_PROVIDER_SITE_OTHER): Payer: Medicare Other | Admitting: Physician Assistant

## 2018-07-14 VITALS — Ht 75.0 in | Wt 185.0 lb

## 2018-07-14 DIAGNOSIS — E1142 Type 2 diabetes mellitus with diabetic polyneuropathy: Secondary | ICD-10-CM

## 2018-07-14 DIAGNOSIS — Z794 Long term (current) use of insulin: Secondary | ICD-10-CM

## 2018-07-14 DIAGNOSIS — F17200 Nicotine dependence, unspecified, uncomplicated: Secondary | ICD-10-CM

## 2018-07-14 DIAGNOSIS — L97521 Non-pressure chronic ulcer of other part of left foot limited to breakdown of skin: Secondary | ICD-10-CM

## 2018-07-14 DIAGNOSIS — Z89431 Acquired absence of right foot: Secondary | ICD-10-CM

## 2018-07-14 DIAGNOSIS — E440 Moderate protein-calorie malnutrition: Secondary | ICD-10-CM

## 2018-07-14 MED ORDER — PENTOXIFYLLINE ER 400 MG PO TBCR: 400.0000 mg | EXTENDED_RELEASE_TABLET | Freq: Three times a day (TID) | ORAL | 1 refills | Status: DC

## 2018-07-14 MED ORDER — METHOCARBAMOL 500 MG PO TABS: 500.0000 mg | ORAL_TABLET | Freq: Three times a day (TID) | ORAL | 0 refills | Status: DC | PRN

## 2018-07-14 MED ORDER — NITROGLYCERIN 0.2 MG/HR TD PT24: 0.2000 mg | MEDICATED_PATCH | Freq: Every day | TRANSDERMAL | 12 refills | Status: DC

## 2018-07-14 MED ORDER — OXYCODONE-ACETAMINOPHEN 5-325 MG PO TABS: 1.0000 | ORAL_TABLET | Freq: Three times a day (TID) | ORAL | 0 refills | Status: DC | PRN

## 2018-07-14 MED ORDER — SULFAMETHOXAZOLE-TRIMETHOPRIM 800-160 MG PO TABS: 1.0000 | ORAL_TABLET | Freq: Two times a day (BID) | ORAL | 0 refills | Status: DC

## 2018-07-14 NOTE — Progress Notes (Signed)
Office Visit Note   Patient: [name]           Date of Birth: Jul 02, 1965           MRN: [id number] Visit Date: 07/14/2018              Requested by: Lucia Gaskins, MD [address] [place],  [place] [address] PCP: Lucia Gaskins, MD  Chief Complaint  Patient presents with  . Right Foot - Routine Post Op    06/15/18 right foot transmet amp      HPI: The patient is a 53 yo gentleman who is seen for postoperative follow up following a right transmetatarsal amputation on 06/15/2018. He has been using a medical shrinker sock to the right transmetatarsal amputation site and off loading as much as possible.  He is also being followed for a plantar Wagoner grade 1 ulcer over the 1st metatarsal of the left foot. He has custom orthotics and is going for follow up of his orthotic.   Assessment & Plan: Visit Diagnoses:  1. History of transmetatarsal amputation of right foot ([place])   2. Type 2 diabetes mellitus with diabetic polyneuropathy, with long-term current use of insulin ([place])   3. Ulcer of left foot, limited to breakdown of skin (HCC)   4. Smoking   5. Moderate protein malnutrition (HCC)     Plan: After informed consent the left plantar first metatarsal head ulcer was debrided with a #10 blade knife to healthy viable bleeding tissue.  Hemostasis was achieved with silver nitrate.  Iodosorb was applied to the area. We will continue Bactrim DS x2 more weeks.  Refilled Percocet and Robaxin this visit.  Also added a nitroglycerin patch to the right foot daily alternating site as well as Trental 400 mg p.o. 3 times daily.  He will continue his silver shrinker stocking around-the-clock except for showering to the right foot and was given a prescription for a medical compression sock for the left foot to wear around the clock except for showering.  Follow-Up Instructions: Return in about 2 weeks (around 07/28/2018).   Ortho Exam  Patient is alert, oriented, no adenopathy,  well-dressed, normal affect, normal respiratory effort. The right transmetatarsal amputation site has some mild dehiscence about the incisional area with scant serous appearing drainage.  There is dried and crusting skin which was debrided from the peri-incisional area.  There is no signs of cellulitis or infection in the foot.  There weakly palpable pedal pulses and working to start some nitroglycerin patches and Trental. After informed consent the left plantar first metatarsal head ulcer was debrided with a #10 blade knife under sterile techniques to viable bleeding tissue.  Hemostasis was achieved with silver nitrate.  He has weakly palpable pedal pulse. The left calf is 32 cm but he wears a size 11 shoe. Imaging: No results found. No images are attached to the encounter.  Labs: Lab Results  Component Value Date   HGBA1C 6.2 (H) 06/13/2018   HGBA1C 6.7 (H) 06/07/2017   HGBA1C 10.3 (H) 08/17/2016   ESRSEDRATE 30 (H) 06/13/2018   ESRSEDRATE 5 06/04/2017   ESRSEDRATE 110 (H) 12/19/2013   CRP 5.7 (H) 06/13/2018   CRP <0.8 06/05/2017   CRP 12.2 (H) 12/19/2013   REPTSTATUS 12/24/2013 FINAL 12/18/2013   REPTSTATUS 12/24/2013 FINAL 12/18/2013   GRAMSTAIN  04/19/2011    WBC PRESENT,BOTH PMN AND MONONUCLEAR NO ORGANISMS SEEN   CULT  12/18/2013    NO GROWTH 5 DAYS Performed at [facility]    CULT  12/18/2013    NO GROWTH 5 DAYS Performed at [facility]      Lab Results  Component Value Date   ALBUMIN 2.9 (L) 06/15/2018   ALBUMIN 2.8 (L) 06/14/2018   ALBUMIN 3.9 06/12/2018    Body mass index is 23.12 kg/m.  Orders:  No orders of the defined types were placed in this encounter.  Meds ordered this encounter  Medications  . sulfamethoxazole-trimethoprim (BACTRIM DS) 800-160 MG tablet    Sig: Take 1 tablet by mouth 2 (two) times daily.    Dispense:  28 tablet    Refill:  0  . oxyCODONE-acetaminophen (PERCOCET/ROXICET) 5-325 MG tablet    Sig: Take 1 tablet  by mouth every 8 (eight) hours as needed for moderate pain or severe pain.    Dispense:  21 tablet    Refill:  0  . methocarbamol (ROBAXIN) 500 MG tablet    Sig: Take 1 tablet (500 mg total) by mouth every 8 (eight) hours as needed for muscle spasms.    Dispense:  20 tablet    Refill:  0  . nitroGLYCERIN (NITRODUR - DOSED IN MG/24 HR) 0.2 mg/hr patch    Sig: Place 1 patch (0.2 mg total) onto the skin daily.    Dispense:  30 patch    Refill:  12    Place on right foot alternating sites  . pentoxifylline (TRENTAL) 400 MG CR tablet    Sig: Take 1 tablet (400 mg total) by mouth 3 (three) times daily with meals.    Dispense:  90 tablet    Refill:  1     Procedures: No procedures performed  Clinical Data: No additional findings.  ROS:  All other systems negative, except as noted in the HPI. Review of Systems  Objective: Vital Signs: Ht 6\' 3"  (1.905 m)   Wt 185 lb (83.9 kg)   BMI 23.12 kg/m   Specialty Comments:  No specialty comments available.  PMFS History: Patient Active Problem List   Diagnosis Date Noted  . Osteomyelitis of right foot ([place])   . Cellulitis of right foot   . Charcot foot due to diabetes mellitus ([place])   . Cellulitis 06/12/2018  . H/O amputation of lesser toe, right ([place]) 07/29/2017  . Diabetic foot infection ([place]) 06/22/2017  . Post op infection 06/22/2017  . SVT (supraventricular tachycardia) ([place]) 08/17/2016  . Tobacco use disorder 10/25/2014  . Diabetes mellitus type 2 in nonobese (HCC)   . Swelling   . Type 2 diabetes mellitus with left diabetic foot ulcer ([place]) 12/18/2013  . Diabetes mellitus due to underlying condition with foot ulcer ([place]) 12/18/2013  . Left leg cellulitis 12/18/2013  . Cocaine abuse ([place]) 03/29/2012  . Generalized anxiety disorder 03/29/2012  . Panic attacks 03/29/2012  . Alcohol dependence ([place]) 08/05/2011    Class: Chronic  . Substance abuse ([place]) 05/31/2011  . Ulcer of left foot, limited to breakdown of skin ([place])  02/15/2011  . Neuropathic ulcer of foot ([place]) 02/15/2011   Past Medical History:  Diagnosis Date  . Anxiety   . Arthritis    "everywhere" (04/21/2012)  . Cellulitis and abscess of foot 12/19/2013   LEFT FOOT  . Chronic pain   . DDD (degenerative disc disease)   . Depression   . Diabetes mellitus without complication (HCC)    borderline  . Diabetic foot ulcer ([place])   . ETOH abuse   . Full dentures   . GERD (  gastroesophageal reflux disease)    tums  . History of blood transfusion    "related to left knee OR; probably right hip too" (04/21/2012)  . Mental disorder   . Neuromuscular disorder (HCC)    neuropathy  . Noncompliance   . Open wound    bottom of foot  . Osteomyelitis ([place])    right 4th toe  . Pneumonia ~ 2012  . Polysubstance abuse (HCC)    etoh, cocaine, heroin  . Stroke [facility]) 2008   "they said I might have had one during right hip replacement" (04/21/2012)  . Wears glasses     Family History  Problem Relation Age of Onset  . Diabetes Father     Past Surgical History:  Procedure Laterality Date  . AMPUTATION Right 06/09/2017   Procedure: RIGHT 2ND TOE AMPUTATION;  Surgeon: [name], MD;  Location: [facility];  Service: Orthopedics;  Laterality: Right;  . AMPUTATION Right 07/23/2017   Procedure: RIGHT 4TH TOE AMPUTATION;  Surgeon: [name], MD;  Location: [facility];  Service: Orthopedics;  Laterality: Right;  . AMPUTATION Right 06/15/2018   Procedure: RIGHT FOOT TRANSMETATARSAL AMPUTATION;  Surgeon: [name], MD;  Location: [facility];  Service: Orthopedics;  Laterality: Right;  right  . JOINT REPLACEMENT    . KNEE ARTHROSCOPY Bilateral 1980's/1990's  . LUNG LOBECTOMY Left ~ 2006  . LUNG LOBECTOMY    . METATARSAL OSTEOTOMY  10/29/2011   Procedure: METATARSAL OSTEOTOMY;  Surgeon: [name], MD;  Location: [facility];  Service: Orthopedics;  Laterality: Left;  Left 1st Metatarsal Dorsal Closing Wedge   . METATARSAL OSTEOTOMY Right 04/28/2017   Procedure: RIGHT 1ST  METATARSAL DORSAL CLOSING WEDGE OSTEOTOMY;  Surgeon: [name], MD;  Location: [facility] [place];  Service: Orthopedics;  Laterality: Right;  . MULTIPLE TOOTH EXTRACTIONS    . REVISION TOTAL HIP ARTHROPLASTY Right 2008   "4-5 months after replacement" (04/21/2012)  . TOTAL HIP ARTHROPLASTY Right 2008  . TOTAL KNEE ARTHROPLASTY Left 2006  . TOTAL KNEE ARTHROPLASTY Right 04/20/2012  . TOTAL KNEE ARTHROPLASTY Right 04/20/2012   Procedure: TOTAL KNEE ARTHROPLASTY;  Surgeon: [name], MD;  Location: [facility];  Service: Orthopedics;  Laterality: Right;  Right Total Knee Arthroplasty   Social History   Occupational History  . Not on file  Tobacco Use  . Smoking status: Current Every Day Smoker    Packs/day: 1.00    Years: 30.00    Pack years: 30.00    Types: Cigarettes  . Smokeless tobacco: Never Used  Substance and Sexual Activity  . Alcohol use: Not Currently    Alcohol/week: 0.0 standard drinks    Frequency: Never  . Drug use: Not Currently    Types: Cocaine    Comment: Cocaine and heroin - 10 years ago  . Sexual activity: Yes    Birth control/protection: None

## 2018-07-18 IMAGING — CT CT CHEST W/ CM
2 of 5 series · 13 of 36 positions shown, 16 images · IV contrast (APPLIED)
Comparison: CT abdomen pelvis 02/15/2015.

CLINICAL DATA: Fall 4 feet onto right side.  Pain

EXAM:
CT CHEST, ABDOMEN, AND PELVIS WITH CONTRAST
TECHNIQUE: Multidetector CT imaging of the chest, abdomen and pelvis was
performed following the standard protocol during bolus
administration of intravenous contrast.
CONTRAST:  100mL [id number] IOPAMIDOL ([id number]) INJECTION 61%

[Series 2: cap with (person_name) · axial · 0.85mm/px · z∈[+1037,+1602]mm · 10 of 139 slices shown, 13 images]
[im 13/139  mediastinal]
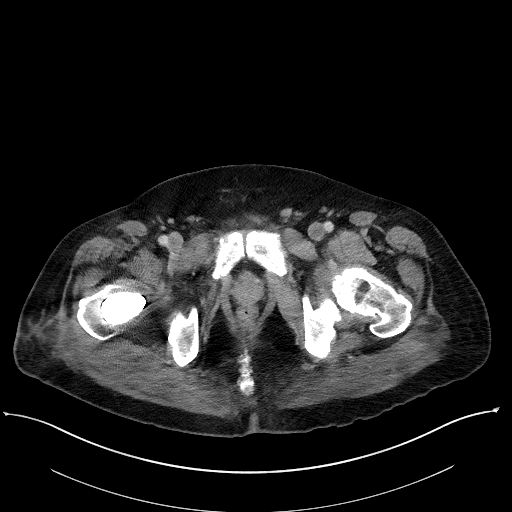
[im 13/139  lung]
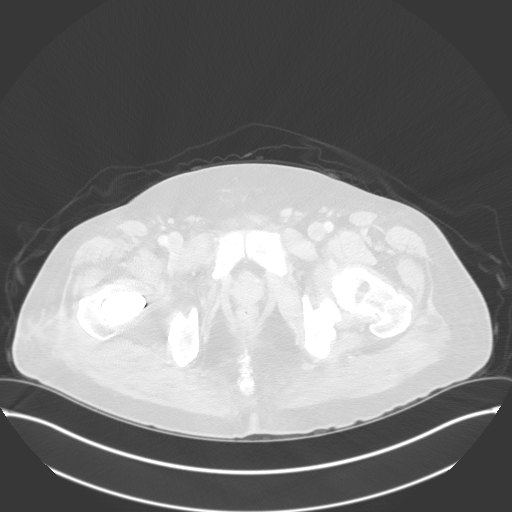
[im 26/139  lung]
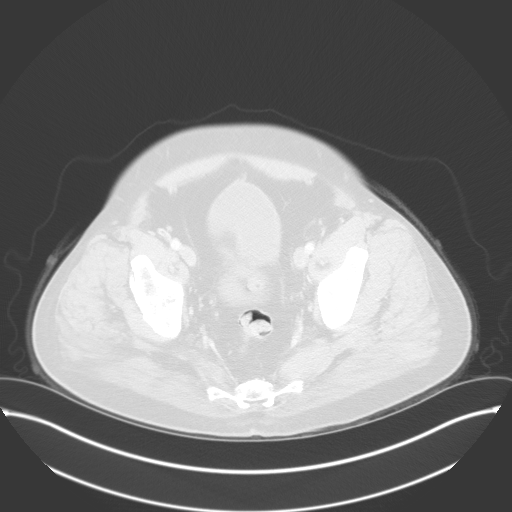
[im 38/139  lung]
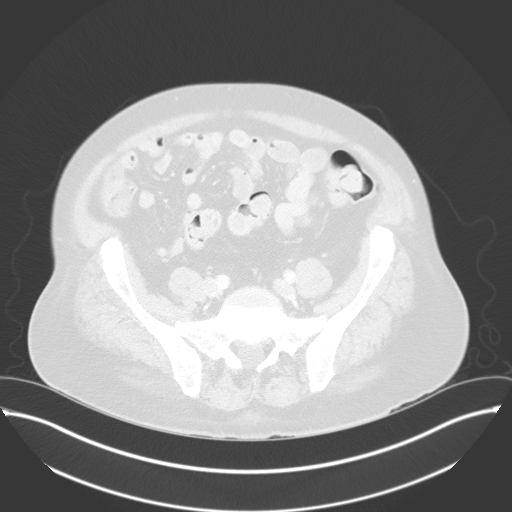
[im 51/139  lung]
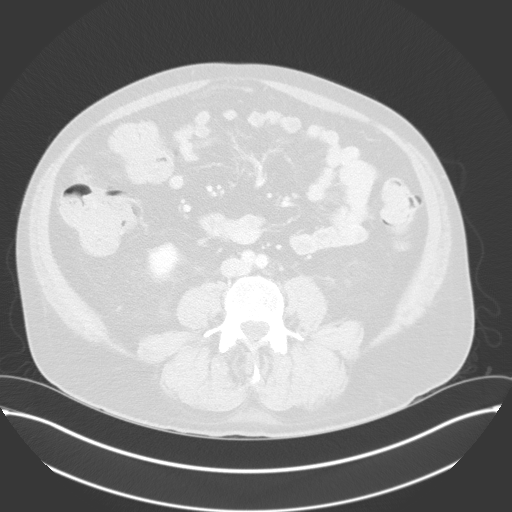
[im 63/139  mediastinal]
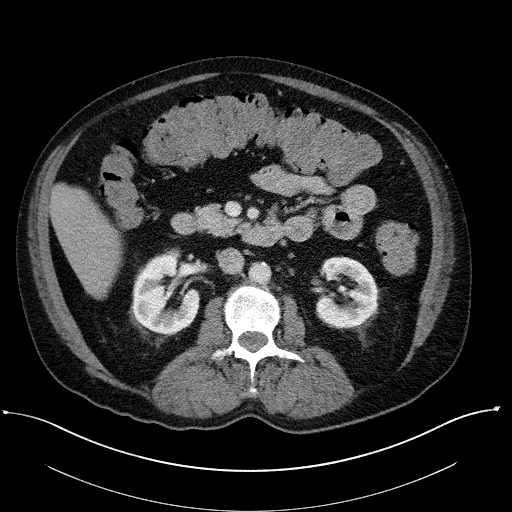
[im 63/139  lung]
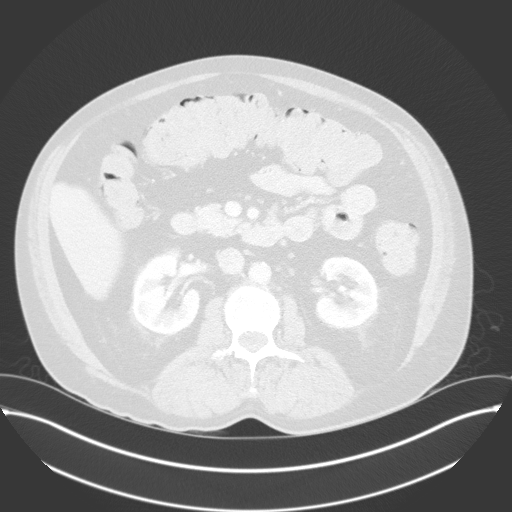
[im 76/139  lung]
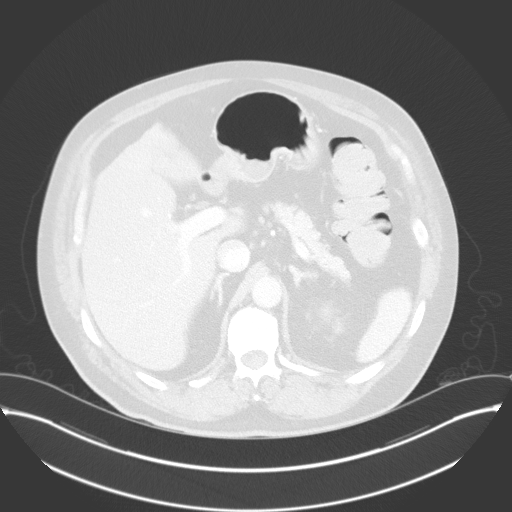
[im 88/139  lung]
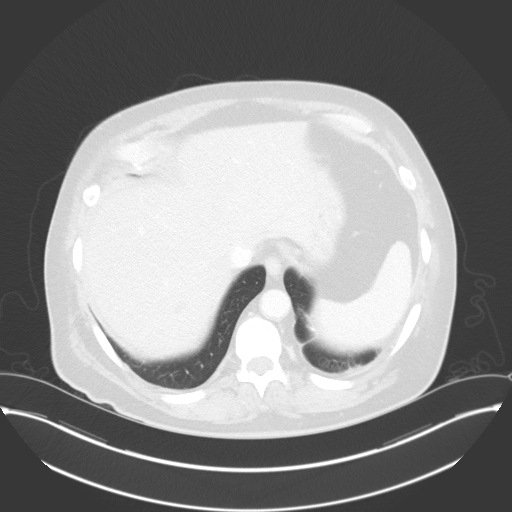
[im 101/139  lung]
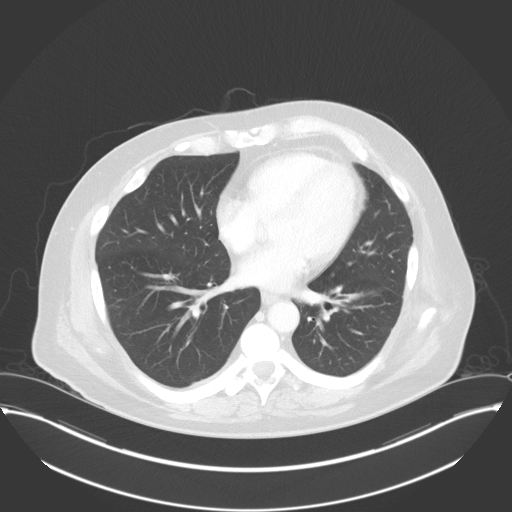
[im 113/139  mediastinal]
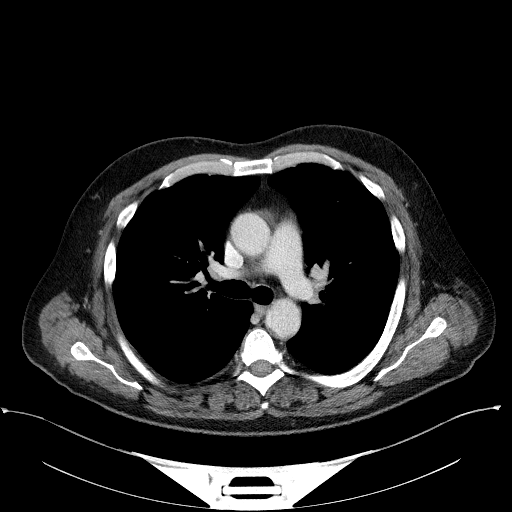
[im 113/139  lung]
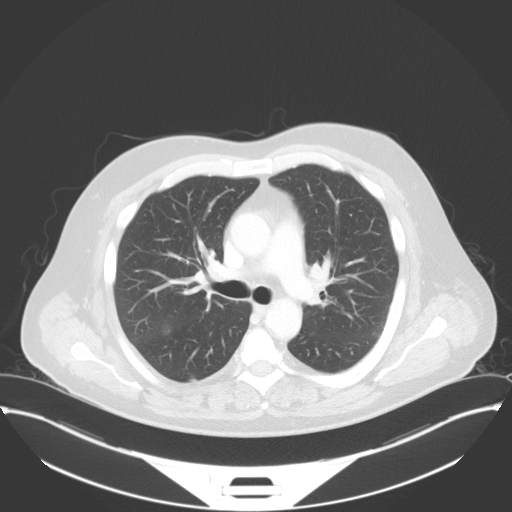
[im 126/139  lung]
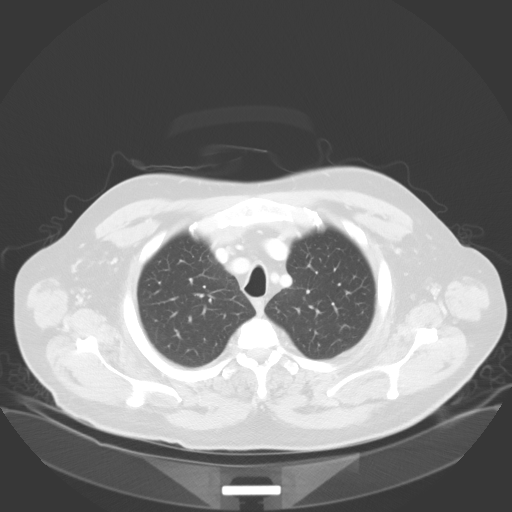

[Series 4: coronals · coronal · 0.77mm/px · 3 of 154 slices shown]
[im 31/154  lung]
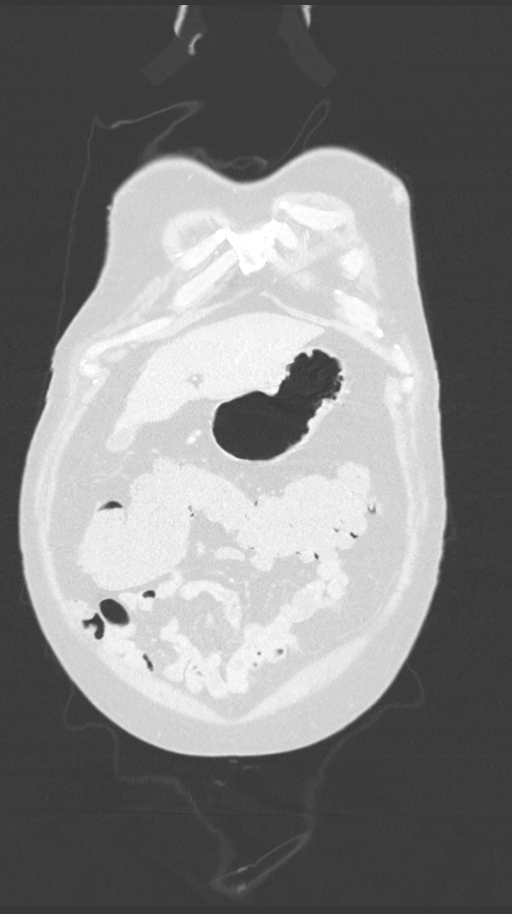
[im 62/154  lung]
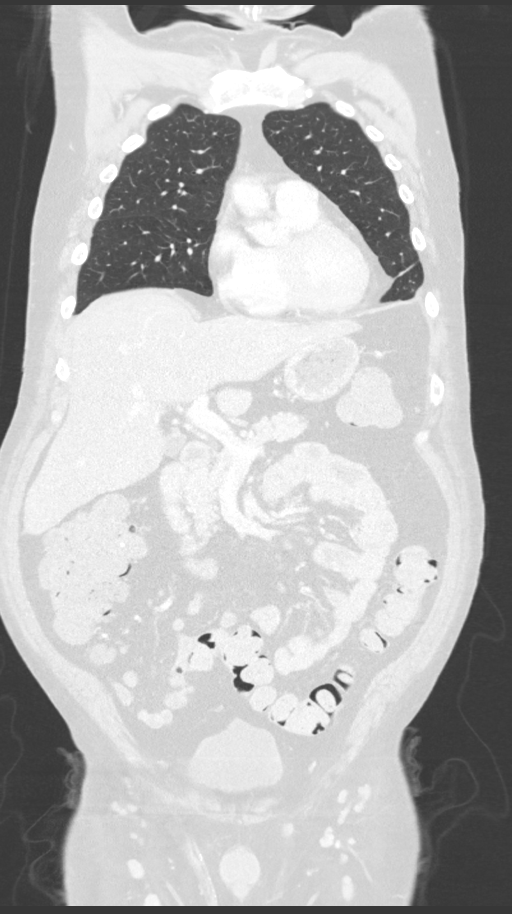
[im 92/154  lung]
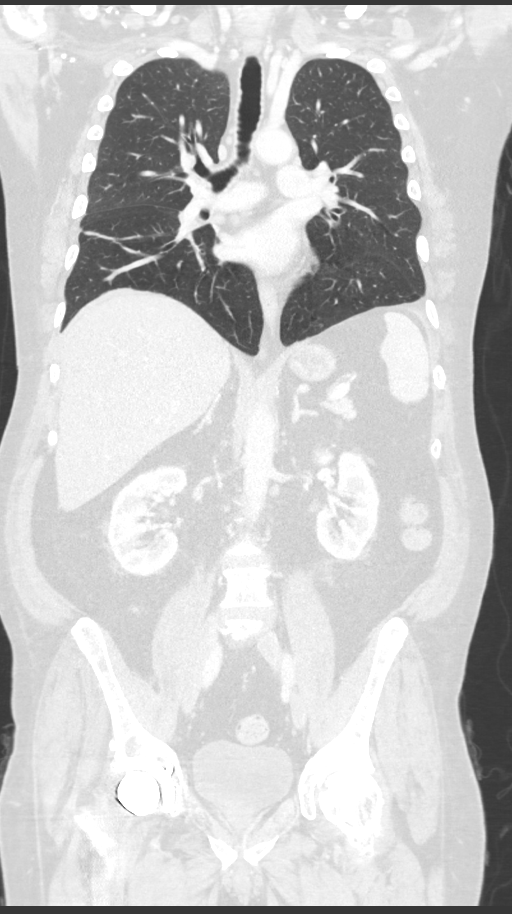

[13 of 36 positions shown; findings below may reference images not displayed]

FINDINGS: CT CHEST FINDINGS

Cardiovascular: Thoracic aorta normal. Heart size normal. Normal
enhancement of the pulmonary arteries. No pericardial effusion.

Mediastinum/Nodes: Negative for mediastinal hematoma. No mass or
adenopathy.

Lungs/Pleura: 3 mm right upper lobe nodule. 5 mm nodule along the
minor fissure most likely a lymph node. No lung mass. Linear
scarring in the left lung base. No infiltrate or effusion.

Musculoskeletal: Negative for thoracic spine fracture. No rib
fracture identified.

CT ABDOMEN PELVIS FINDINGS

Hepatobiliary: 10 x 15 mm hypodensity in the dome of the liver
posteriorly. Possible hemangioma or cyst. No other liver lesion.
Gallbladder and bile ducts normal. No evidence of liver injury.

Pancreas: Negative

Spleen: negative

Adrenals/Urinary Tract: Symmetric excretion of contrast by both
kidneys. No renal mass or injury. No hydronephrosis. No renal
calculi. Urinary bladder normal.

Stomach/Bowel: Stomach and duodenum normal. Negative for bowel
obstruction. Large amount of stool throughout the colon. Normal
appendix. No bowel edema.

Vascular/Lymphatic: Mild atherosclerotic disease in the aorta.
Negative for aneurysm. No adenopathy.

Reproductive: Normal prostate.

Other: No free fluid. No soft tissue contusion. Negative for hernia.

Musculoskeletal: Negative for fracture. Right hip replacement in
satisfactory alignment.
IMPRESSION: No acute injury in the chest abdomen or pelvis.

## 2018-07-18 IMAGING — DX DG SHOULDER 2+V*R*
3 series · 3 of 3 positions shown · non-contrast
Comparison: None.

CLINICAL DATA: Status post fall.  Right shoulder pain.

EXAM:
RIGHT SHOULDER - 2+ VIEW

[shoulder grashey]
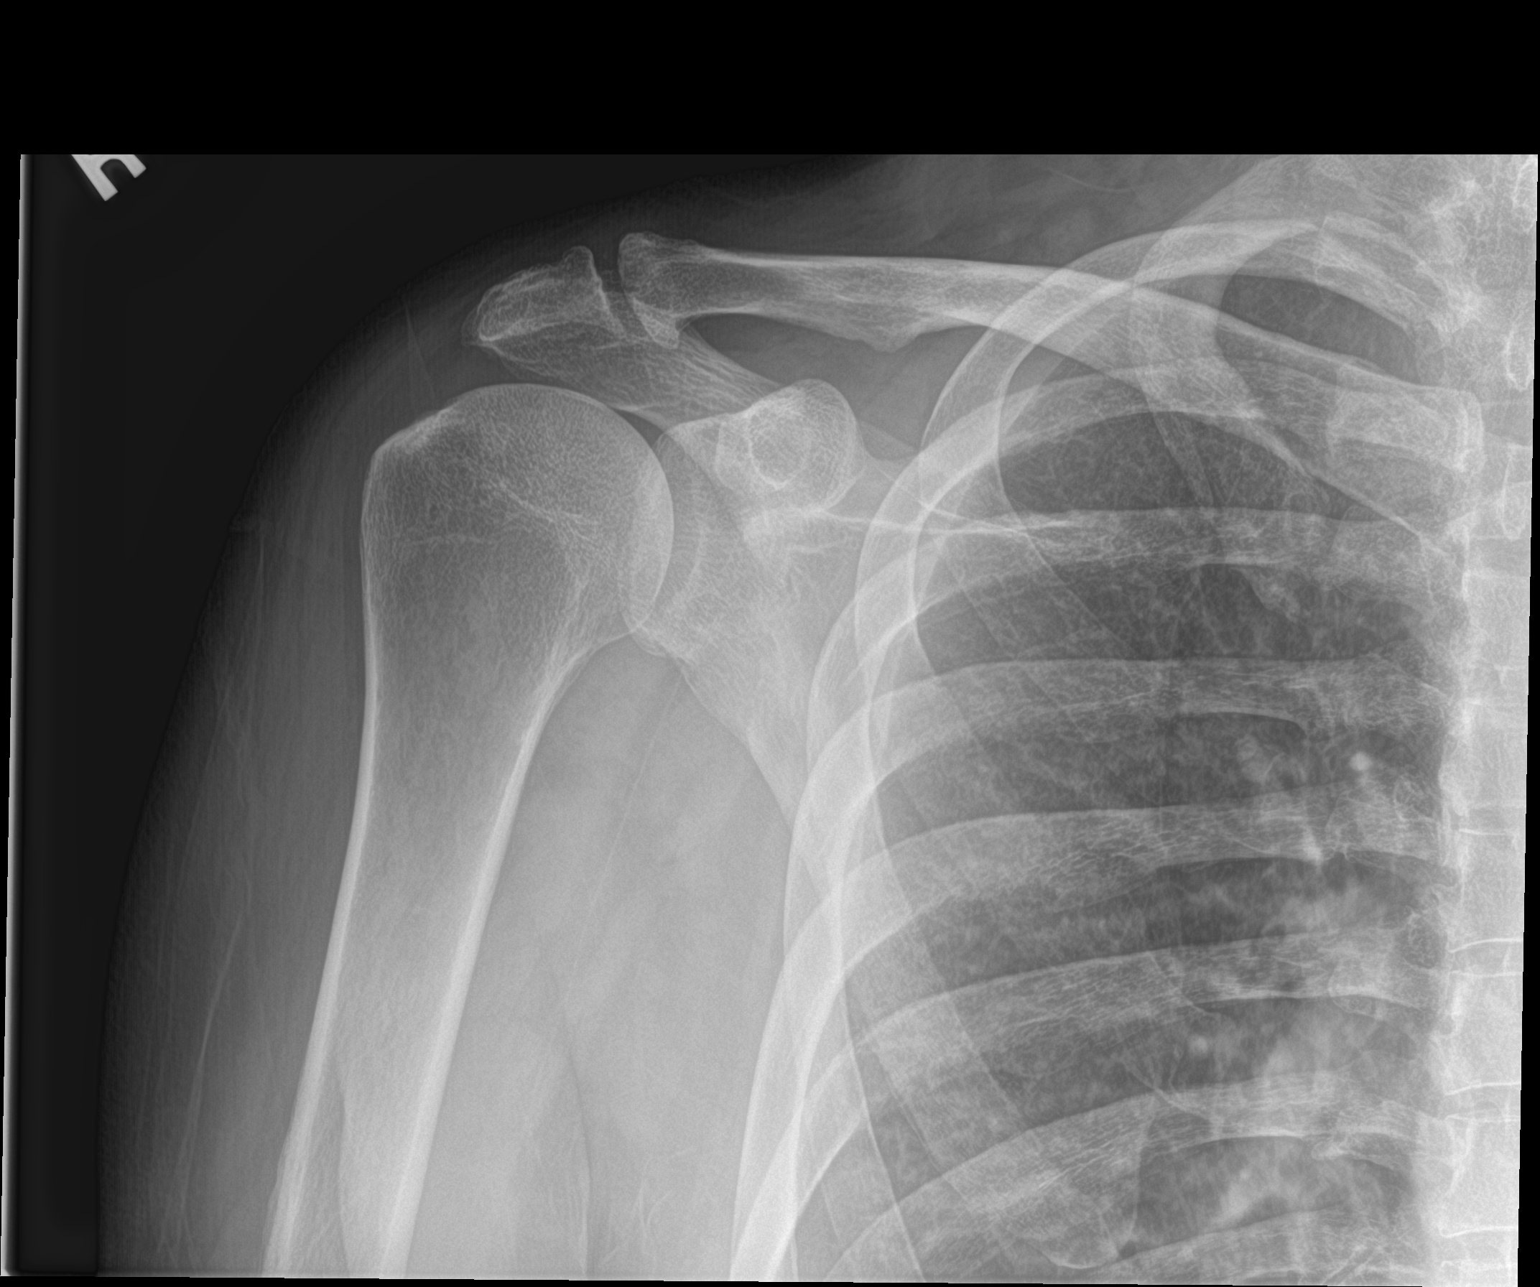

[shoulder y view]
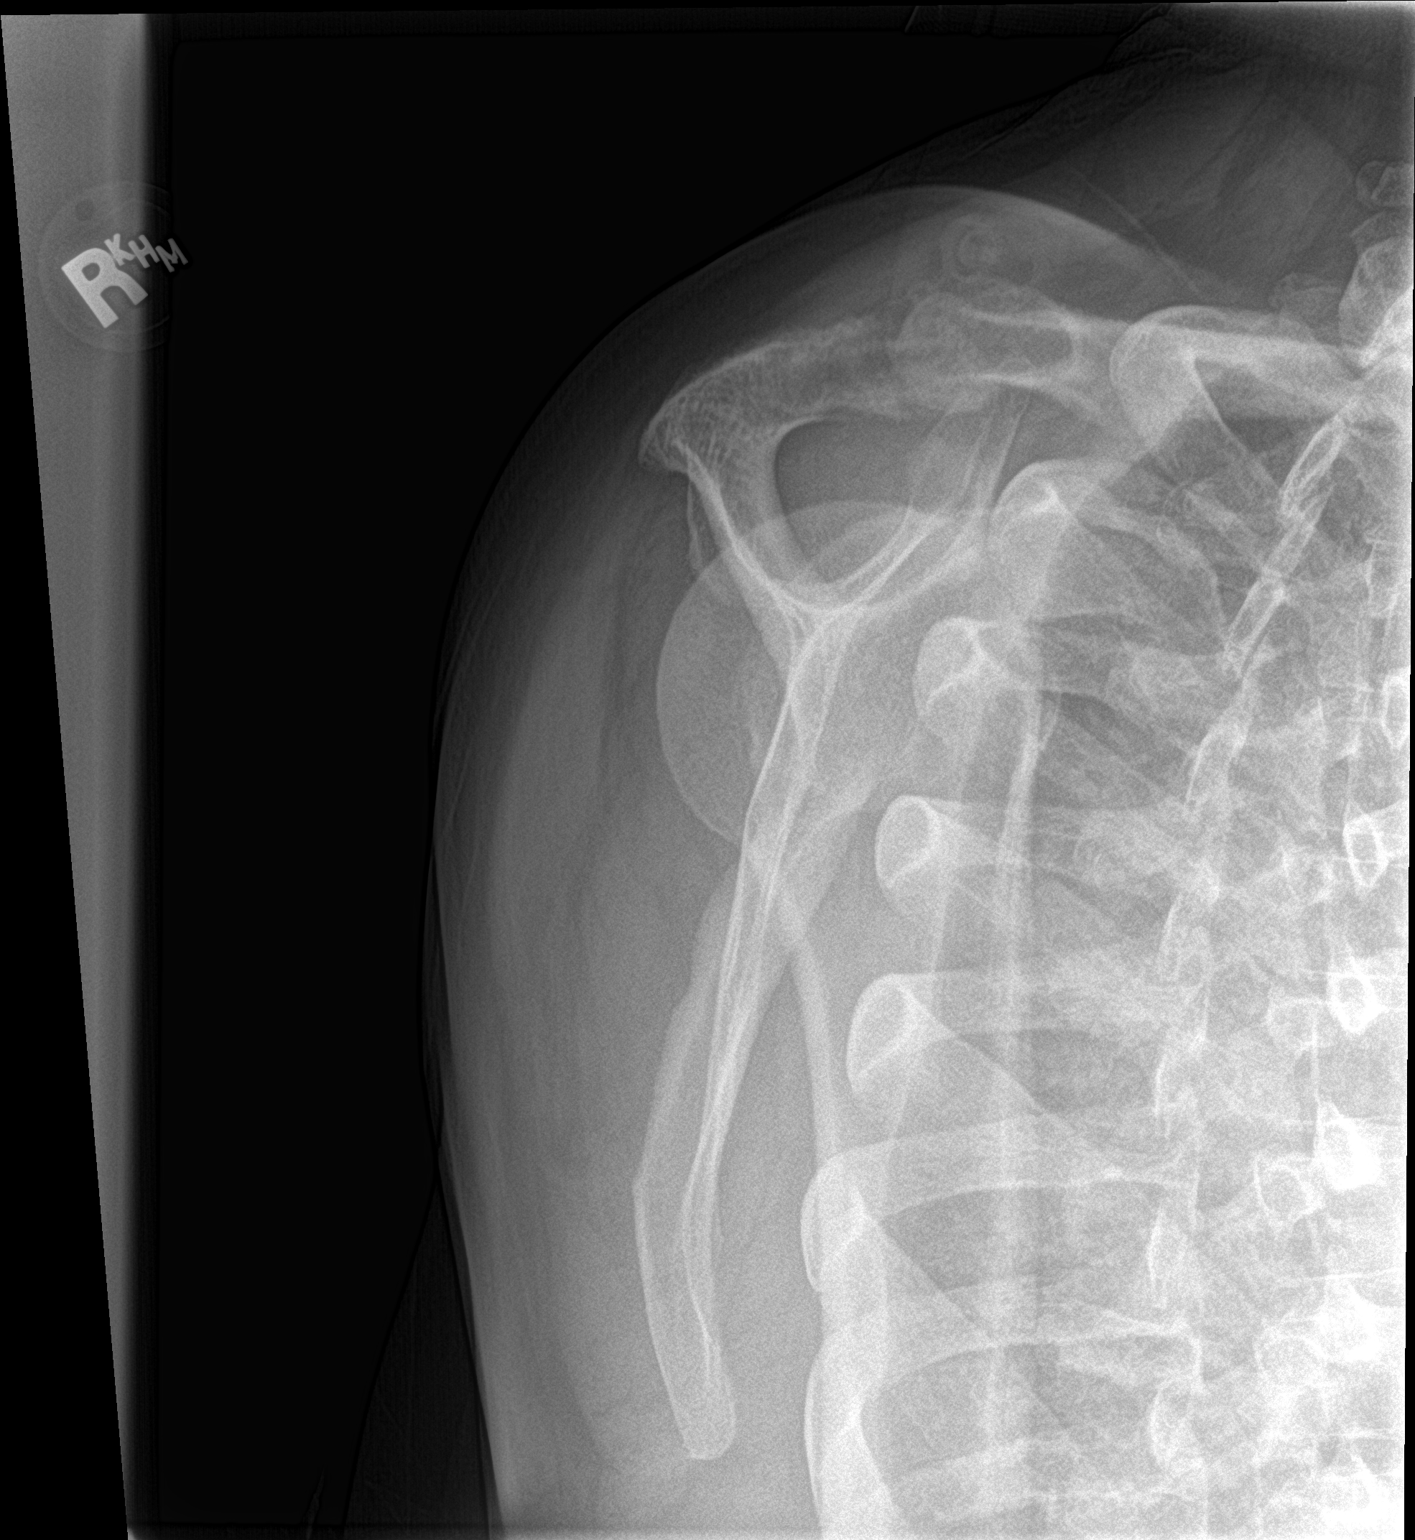

[shoulder axillary]
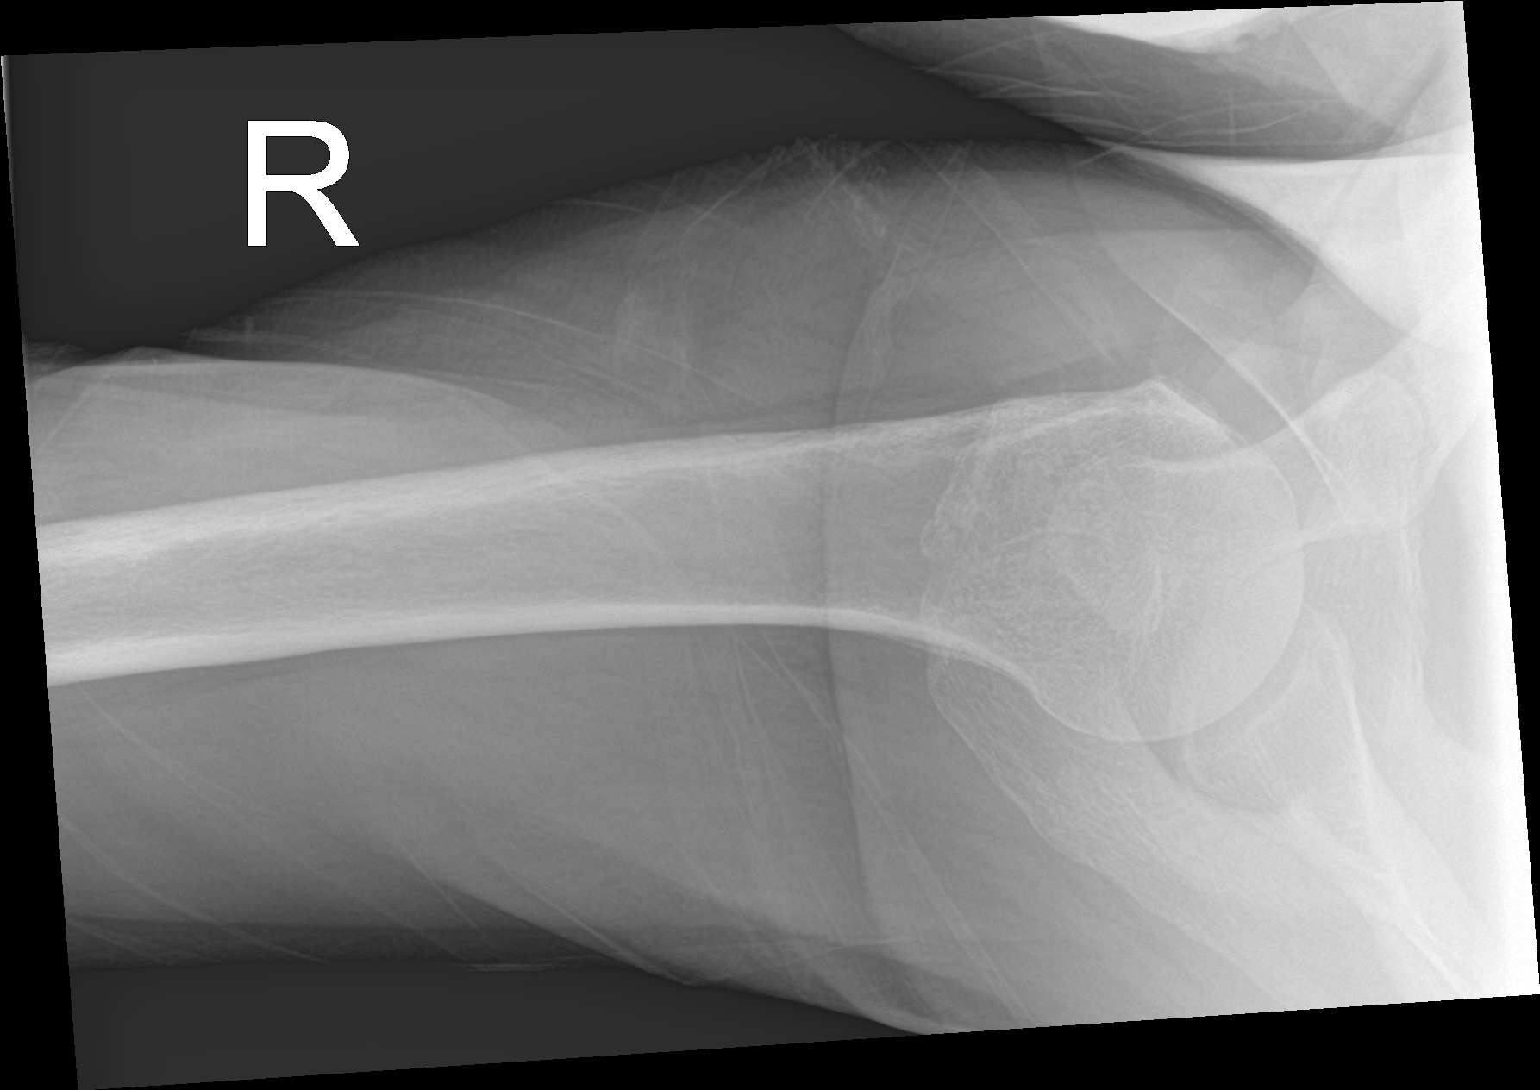

[3 of 3 positions shown; findings below may reference images not displayed]

FINDINGS: There is no fracture or dislocation. There are mild degenerative
changes of the acromioclavicular joint.
IMPRESSION: No acute osseous injury of the right shoulder.

## 2018-07-19 ENCOUNTER — Other Ambulatory Visit: Payer: Self-pay | Admitting: Physician Assistant

## 2018-07-19 DIAGNOSIS — Z89431 Acquired absence of right foot: Secondary | ICD-10-CM

## 2018-07-20 ENCOUNTER — Telehealth: Payer: Self-pay | Admitting: Orthopedic Surgery

## 2018-07-20 DIAGNOSIS — Z89431 Acquired absence of right foot: Secondary | ICD-10-CM

## 2018-07-20 MED ORDER — OXYCODONE-ACETAMINOPHEN 5-325 MG PO TABS: 1.0000 | ORAL_TABLET | Freq: Two times a day (BID) | ORAL | 0 refills | Status: DC | PRN

## 2018-07-20 NOTE — Telephone Encounter (Signed)
Pt has had 204 tab of pain medication in 4 weeks. 06/15/18  transmet amputation. Does receive #120 Oxycodone 10 mg tabs from another doctor 07/06/18

## 2018-07-20 NOTE — Telephone Encounter (Signed)
[name] called in to request a refill of Percocet.

## 2018-07-20 NOTE — Addendum Note (Signed)
Addended by: Dondra Prader R on: 07/20/2018 04:50 PM   Modules accepted: Orders

## 2018-07-28 ENCOUNTER — Encounter: Payer: Self-pay | Admitting: Physician Assistant

## 2018-07-28 ENCOUNTER — Ambulatory Visit (INDEPENDENT_AMBULATORY_CARE_PROVIDER_SITE_OTHER): Payer: Medicare Other | Admitting: Physician Assistant

## 2018-07-28 ENCOUNTER — Other Ambulatory Visit: Payer: Self-pay

## 2018-07-28 VITALS — Ht 75.0 in | Wt 185.0 lb

## 2018-07-28 DIAGNOSIS — Z89431 Acquired absence of right foot: Secondary | ICD-10-CM

## 2018-07-28 DIAGNOSIS — F17200 Nicotine dependence, unspecified, uncomplicated: Secondary | ICD-10-CM

## 2018-07-28 DIAGNOSIS — E1142 Type 2 diabetes mellitus with diabetic polyneuropathy: Secondary | ICD-10-CM

## 2018-07-28 DIAGNOSIS — L97521 Non-pressure chronic ulcer of other part of left foot limited to breakdown of skin: Secondary | ICD-10-CM

## 2018-07-28 DIAGNOSIS — Z794 Long term (current) use of insulin: Secondary | ICD-10-CM

## 2018-07-28 MED ORDER — METHOCARBAMOL 500 MG PO TABS: 500.0000 mg | ORAL_TABLET | Freq: Four times a day (QID) | ORAL | 0 refills | Status: DC | PRN

## 2018-07-28 MED ORDER — OXYCODONE-ACETAMINOPHEN 5-325 MG PO TABS: 1.0000 | ORAL_TABLET | Freq: Two times a day (BID) | ORAL | 0 refills | Status: DC | PRN

## 2018-07-28 NOTE — Progress Notes (Signed)
Office Visit Note   Patient: [name]           Date of Birth: 1965-03-13           MRN: [id number] Visit Date: 07/28/2018              Requested by: Lucia Gaskins, MD [address] [place],  [place] [address] PCP: Lucia Gaskins, MD  Chief Complaint  Patient presents with  . Right Foot - Routine Post Op    06/15/18 right foot transmet amputation       HPI: The patient is a 53 year old gentleman who is seen for follow-up of his right foot transmetatarsal amputation and left foot plantar first metatarsal head ulcer.  He reports he has been wearing his compression shrinker to the right foot.  He just finished a course of Bactrim.  He is continued to have moderate pain over the foot and requests a refill of Robaxin and Percocet.  Assessment & Plan: Visit Diagnoses:  1. Ulcer of left foot, limited to breakdown of skin ([place])   2. History of transmetatarsal amputation of right foot ([place])   3. Type 2 diabetes mellitus with diabetic polyneuropathy, with long-term current use of insulin (HCC)   4. Smoking     Plan: after informed consent, the right transmetatarsal amputation site incisional area was debrided of hard eschar and callus to healthy appearing intact tissue.  The left plantar 1st metatarsal head ulcer was debrided to healthy bleeding tissue and hemostasis was achieved with silver nitrate. Iodosorb ointment was applied to the left 1st MTH ulceration . Given prescription for [facility] [employer] to fabricate a new insert for the right shoe if required and then a spacer for the right shoe following his transmetatarsal amputation. [facility] , [place], [place] phone [phone] and [phone].   Follow-Up Instructions: Return in about 2 weeks (around 08/11/2018).   Ortho Exam  Patient is alert, oriented, no adenopathy, well-dressed, normal affect, normal respiratory effort. After informed consent the right transmetatarsal amputation site incisional area was debrided from hard  eschar and callus.  There is healthy appearing intact skin underneath.  There is some residual cracking about the incisional area but no evidence of overt dehiscence.  There is some mild erythema but palpable pedal pulse no sign of cellulitis of the foot. The left plantar first metatarsal head ulcer was also debrided of callus and soft tissue to healthy bleeding tissue.  Hemostasis was achieved with silver nitrate.  Residual wound is 1 x 1.5 x 0.2 cm.  There is no signs of cellulitis or infection of the foot.  There are intact pedal pulses.  Imaging: No results found. No images are attached to the encounter.  Labs: Lab Results  Component Value Date   HGBA1C 6.2 (H) 06/13/2018   HGBA1C 6.7 (H) 06/07/2017   HGBA1C 10.3 (H) 08/17/2016   ESRSEDRATE 30 (H) 06/13/2018   ESRSEDRATE 5 06/04/2017   ESRSEDRATE 110 (H) 12/19/2013   CRP 5.7 (H) 06/13/2018   CRP <0.8 06/05/2017   CRP 12.2 (H) 12/19/2013   REPTSTATUS 12/24/2013 FINAL 12/18/2013   REPTSTATUS 12/24/2013 FINAL 12/18/2013   GRAMSTAIN  04/19/2011    WBC PRESENT,BOTH PMN AND MONONUCLEAR NO ORGANISMS SEEN   CULT  12/18/2013    NO GROWTH 5 DAYS Performed at [facility]  12/18/2013    NO GROWTH 5 DAYS Performed at [facility]      Lab Results  Component Value Date   ALBUMIN  2.9 (L) 06/15/2018   ALBUMIN 2.8 (L) 06/14/2018   ALBUMIN 3.9 06/12/2018    Lab Results  Component Value Date   MG 1.9 06/15/2018   MG 1.9 06/14/2018   MG 2.0 08/16/2016   No results found for: VD25OH  No results found for: PREALBUMIN CBC EXTENDED Latest Ref Rng & Units 06/17/2018 06/16/2018 06/15/2018  WBC 4.0 - 10.5 K/uL 7.7 - 5.6  RBC 4.22 - 5.81 MIL/uL 4.69 - 4.47  HGB 13.0 - 17.0 g/dL 13.0 12.0(L) 12.2(L)  HCT 39.0 - 52.0 % 39.0 36.4(L) 36.9(L)  PLT 150 - 400 K/uL 232 - 237  NEUTROABS 1.7 - 7.7 K/uL - - -  LYMPHSABS 0.7 - 4.0 K/uL - - -     Body mass index is 23.12 kg/m.  Orders:  No orders of the defined  types were placed in this encounter.  Meds ordered this encounter  Medications  . methocarbamol (ROBAXIN) 500 MG tablet    Sig: Take 1 tablet (500 mg total) by mouth every 6 (six) hours as needed for muscle spasms.    Dispense:  60 tablet    Refill:  0  . oxyCODONE-acetaminophen (PERCOCET/ROXICET) 5-325 MG tablet    Sig: Take 1 tablet by mouth 2 (two) times daily as needed for moderate pain or severe pain.    Dispense:  14 tablet    Refill:  0     Procedures: No procedures performed  Clinical Data: No additional findings.  ROS:  All other systems negative, except as noted in the HPI. Review of Systems  Objective: Vital Signs: Ht 6\' 3"  (1.905 m)   Wt 185 lb (83.9 kg)   BMI 23.12 kg/m   Specialty Comments:  No specialty comments available.  PMFS History: Patient Active Problem List   Diagnosis Date Noted  . Osteomyelitis of right foot ([place])   . Cellulitis of right foot   . Charcot foot due to diabetes mellitus ([place])   . Cellulitis 06/12/2018  . H/O amputation of lesser toe, right ([place]) 07/29/2017  . Diabetic foot infection ([place]) 06/22/2017  . Post op infection 06/22/2017  . SVT (supraventricular tachycardia) ([place]) 08/17/2016  . Tobacco use disorder 10/25/2014  . Diabetes mellitus type 2 in nonobese (HCC)   . Swelling   . Type 2 diabetes mellitus with left diabetic foot ulcer ([place]) 12/18/2013  . Diabetes mellitus due to underlying condition with foot ulcer ([place]) 12/18/2013  . Left leg cellulitis 12/18/2013  . Cocaine abuse ([name]) 03/29/2012  . Generalized anxiety disorder 03/29/2012  . Panic attacks 03/29/2012  . Alcohol dependence ([place]) 08/05/2011    Class: Chronic  . Substance abuse ([place]) 05/31/2011  . Ulcer of left foot, limited to breakdown of skin ([place]) 02/15/2011  . Neuropathic ulcer of foot ([place]) 02/15/2011   Past Medical History:  Diagnosis Date  . Anxiety   . Arthritis    "everywhere" (04/21/2012)  . Cellulitis and abscess of foot 12/19/2013   LEFT  FOOT  . Chronic pain   . DDD (degenerative disc disease)   . Depression   . Diabetes mellitus without complication (HCC)    borderline  . Diabetic foot ulcer ([place])   . ETOH abuse   . Full dentures   . GERD (gastroesophageal reflux disease)    tums  . History of blood transfusion    "related to left knee OR; probably right hip too" (04/21/2012)  . Mental disorder   . Neuromuscular disorder (HCC)    neuropathy  . Noncompliance   .  Open wound    bottom of foot  . Osteomyelitis ([place])    right 4th toe  . Pneumonia ~ 2012  . Polysubstance abuse (HCC)    etoh, cocaine, heroin  . Stroke [facility]) 2008   "they said I might have had one during right hip replacement" (04/21/2012)  . Wears glasses     Family History  Problem Relation Age of Onset  . Diabetes Father     Past Surgical History:  Procedure Laterality Date  . AMPUTATION Right 06/09/2017   Procedure: RIGHT 2ND TOE AMPUTATION;  Surgeon: [name], MD;  Location: [facility];  Service: Orthopedics;  Laterality: Right;  . AMPUTATION Right 07/23/2017   Procedure: RIGHT 4TH TOE AMPUTATION;  Surgeon: [name], MD;  Location: [facility];  Service: Orthopedics;  Laterality: Right;  . AMPUTATION Right 06/15/2018   Procedure: RIGHT FOOT TRANSMETATARSAL AMPUTATION;  Surgeon: [name], MD;  Location: [facility];  Service: Orthopedics;  Laterality: Right;  right  . JOINT REPLACEMENT    . KNEE ARTHROSCOPY Bilateral 1980's/1990's  . LUNG LOBECTOMY Left ~ 2006  . LUNG LOBECTOMY    . METATARSAL OSTEOTOMY  10/29/2011   Procedure: METATARSAL OSTEOTOMY;  Surgeon: [name], MD;  Location: [facility];  Service: Orthopedics;  Laterality: Left;  Left 1st Metatarsal Dorsal Closing Wedge   . METATARSAL OSTEOTOMY Right 04/28/2017   Procedure: RIGHT 1ST METATARSAL DORSAL CLOSING WEDGE OSTEOTOMY;  Surgeon: [name], MD;  Location: [facility];  Service: Orthopedics;  Laterality: Right;  . MULTIPLE TOOTH EXTRACTIONS    . REVISION TOTAL HIP ARTHROPLASTY Right  2008   "4-5 months after replacement" (04/21/2012)  . TOTAL HIP ARTHROPLASTY Right 2008  . TOTAL KNEE ARTHROPLASTY Left 2006  . TOTAL KNEE ARTHROPLASTY Right 04/20/2012  . TOTAL KNEE ARTHROPLASTY Right 04/20/2012   Procedure: TOTAL KNEE ARTHROPLASTY;  Surgeon: [name], MD;  Location: [facility];  Service: Orthopedics;  Laterality: Right;  Right Total Knee Arthroplasty   Social History   Occupational History  . Not on file  Tobacco Use  . Smoking status: Current Every Day Smoker    Packs/day: 1.00    Years: 30.00    Pack years: 30.00    Types: Cigarettes  . Smokeless tobacco: Never Used  Substance and Sexual Activity  . Alcohol use: Not Currently    Alcohol/week: 0.0 standard drinks    Frequency: Never  . Drug use: Not Currently    Types: Cocaine    Comment: Cocaine and heroin - 10 years ago  . Sexual activity: Yes    Birth control/protection: None

## 2018-08-11 ENCOUNTER — Encounter: Payer: Self-pay | Admitting: Physician Assistant

## 2018-08-11 ENCOUNTER — Ambulatory Visit (INDEPENDENT_AMBULATORY_CARE_PROVIDER_SITE_OTHER): Payer: Medicare Other | Admitting: Physician Assistant

## 2018-08-11 VITALS — Ht 75.0 in | Wt 185.0 lb

## 2018-08-11 DIAGNOSIS — L97521 Non-pressure chronic ulcer of other part of left foot limited to breakdown of skin: Secondary | ICD-10-CM | POA: Diagnosis not present

## 2018-08-11 DIAGNOSIS — E1142 Type 2 diabetes mellitus with diabetic polyneuropathy: Secondary | ICD-10-CM | POA: Diagnosis not present

## 2018-08-11 DIAGNOSIS — Z89431 Acquired absence of right foot: Secondary | ICD-10-CM

## 2018-08-11 DIAGNOSIS — F17200 Nicotine dependence, unspecified, uncomplicated: Secondary | ICD-10-CM | POA: Diagnosis not present

## 2018-08-11 DIAGNOSIS — Z794 Long term (current) use of insulin: Secondary | ICD-10-CM

## 2018-08-11 MED ORDER — METHOCARBAMOL 500 MG PO TABS: 500.0000 mg | ORAL_TABLET | Freq: Four times a day (QID) | ORAL | 0 refills | Status: DC | PRN

## 2018-08-11 MED ORDER — SILVER SULFADIAZINE 1 % EX CREA: 1.0000 "application " | TOPICAL_CREAM | Freq: Every day | CUTANEOUS | 0 refills | Status: DC

## 2018-08-11 NOTE — Progress Notes (Signed)
Office Visit Note   Patient: [name]           Date of Birth: 06-08-1965           MRN: [id number] Visit Date: 08/11/2018              Requested by: Lucia Gaskins, MD [address] [place],  [place] [address] PCP: Lucia Gaskins, MD  Chief Complaint  Patient presents with  . Right Foot - Routine Post Op    06/15/18 right foot transmet amputation   . Left Foot - Wound Check      HPI: The patient is a 53 year old gentleman who is seen for follow-up of his right foot transmetatarsal amputation and left foot plantar first metatarsal head ulceration.  He is wearing a medical shrinker stocking over the right foot but reports that this is worn out.  He does feel like the right transmetatarsal amputation site is healing very well.  He reports his pain however is 6 out of 10 at times.  He does note some minimal drainage from the left metatarsal head ulcer.  Assessment & Plan: Visit Diagnoses:  1. Ulcer of left foot, limited to breakdown of skin ([place])   2. History of transmetatarsal amputation of right foot (HCC)   3. Smoking   4. Type 2 diabetes mellitus with diabetic polyneuropathy, with long-term current use of insulin (HCC)     Plan: After informed consent, the left foot first metatarsal head plantar ulceration was debrided with a #10 blade knife to healthy viable tissue and bleeding controlled with silver nitrate.  He will continue some Silvadene cream to the foot daily after washing with soap and water.  Continue to offload the foot whenever possible. He was given a another medical shrinker stocking to wear over the right transmetatarsal amputation and this is healing well.  His Robaxin was refilled and Silvadene was refilled this visit. Follow up in 2 weeks.   Follow-Up Instructions: Return in about 2 weeks (around 08/25/2018).   Ortho Exam  Patient is alert, oriented, no adenopathy, well-dressed, normal affect, normal respiratory effort. The right transmetatarsal  amputation site is healing well.  There are no signs of cellulitis or infection.  He has palpable pedal pulses.  There is less erythema and edema of the residual foot. After informed consent the left first metatarsal plantar ulcer was debrided with a #10 blade knife to healthy viable tissue.  Hemostasis was achieved was silver nitrate.  The ulcer is approximately 2 cm in diameter and 3 mm in depth.  It does not appear to track deeply and there is no visible bone or tendon.  There are no signs of cellulitis or infection. Imaging: No results found. No images are attached to the encounter.  Labs: Lab Results  Component Value Date   HGBA1C 6.2 (H) 06/13/2018   HGBA1C 6.7 (H) 06/07/2017   HGBA1C 10.3 (H) 08/17/2016   ESRSEDRATE 30 (H) 06/13/2018   ESRSEDRATE 5 06/04/2017   ESRSEDRATE 110 (H) 12/19/2013   CRP 5.7 (H) 06/13/2018   CRP <0.8 06/05/2017   CRP 12.2 (H) 12/19/2013   REPTSTATUS 12/24/2013 FINAL 12/18/2013   REPTSTATUS 12/24/2013 FINAL 12/18/2013   GRAMSTAIN  04/19/2011    WBC PRESENT,BOTH PMN AND MONONUCLEAR NO ORGANISMS SEEN   CULT  12/18/2013    NO GROWTH 5 DAYS Performed at [facility]  12/18/2013    NO GROWTH 5 DAYS Performed at [facility]  Lab Results  Component Value Date   ALBUMIN 2.9 (L) 06/15/2018   ALBUMIN 2.8 (L) 06/14/2018   ALBUMIN 3.9 06/12/2018    Lab Results  Component Value Date   MG 1.9 06/15/2018   MG 1.9 06/14/2018   MG 2.0 08/16/2016   No results found for: VD25OH  No results found for: PREALBUMIN CBC EXTENDED Latest Ref Rng & Units 06/17/2018 06/16/2018 06/15/2018  WBC 4.0 - 10.5 K/uL 7.7 - 5.6  RBC 4.22 - 5.81 MIL/uL 4.69 - 4.47  HGB 13.0 - 17.0 g/dL 13.0 12.0(L) 12.2(L)  HCT 39.0 - 52.0 % 39.0 36.4(L) 36.9(L)  PLT 150 - 400 K/uL 232 - 237  NEUTROABS 1.7 - 7.7 K/uL - - -  LYMPHSABS 0.7 - 4.0 K/uL - - -     Body mass index is 23.12 kg/m.  Orders:  No orders of the defined types were placed in this  encounter.  Meds ordered this encounter  Medications  . methocarbamol (ROBAXIN) 500 MG tablet    Sig: Take 1 tablet (500 mg total) by mouth every 6 (six) hours as needed for muscle spasms.    Dispense:  60 tablet    Refill:  0  . silver sulfADIAZINE (SILVADENE) 1 % cream    Sig: Apply 1 application topically daily.    Dispense:  50 g    Refill:  0     Procedures: No procedures performed  Clinical Data: No additional findings.  ROS:  All other systems negative, except as noted in the HPI. Review of Systems  Objective: Vital Signs: Ht 6\' 3"  (1.905 m)   Wt 185 lb (83.9 kg)   BMI 23.12 kg/m   Specialty Comments:  No specialty comments available.  PMFS History: Patient Active Problem List   Diagnosis Date Noted  . Osteomyelitis of right foot ([place])   . Cellulitis of right foot   . Charcot foot due to diabetes mellitus ([place])   . Cellulitis 06/12/2018  . H/O amputation of lesser toe, right ([place]) 07/29/2017  . Diabetic foot infection ([place]) 06/22/2017  . Post op infection 06/22/2017  . SVT (supraventricular tachycardia) ([place]) 08/17/2016  . Tobacco use disorder 10/25/2014  . Diabetes mellitus type 2 in nonobese (HCC)   . Swelling   . Type 2 diabetes mellitus with left diabetic foot ulcer ([place]) 12/18/2013  . Diabetes mellitus due to underlying condition with foot ulcer ([place]) 12/18/2013  . Left leg cellulitis 12/18/2013  . Cocaine abuse ([place]) 03/29/2012  . Generalized anxiety disorder 03/29/2012  . Panic attacks 03/29/2012  . Alcohol dependence ([place]) 08/05/2011    Class: Chronic  . Substance abuse ([place]) 05/31/2011  . Ulcer of left foot, limited to breakdown of skin ([place]) 02/15/2011  . Neuropathic ulcer of foot ([place]) 02/15/2011   Past Medical History:  Diagnosis Date  . Anxiety   . Arthritis    "everywhere" (04/21/2012)  . Cellulitis and abscess of foot 12/19/2013   LEFT FOOT  . Chronic pain   . DDD (degenerative disc disease)   . Depression   . Diabetes mellitus  without complication (HCC)    borderline  . Diabetic foot ulcer ([place])   . ETOH abuse   . Full dentures   . GERD (gastroesophageal reflux disease)    tums  . History of blood transfusion    "related to left knee OR; probably right hip too" (04/21/2012)  . Mental disorder   . Neuromuscular disorder (HCC)    neuropathy  . Noncompliance   .  Open wound    bottom of foot  . Osteomyelitis ([place])    right 4th toe  . Pneumonia ~ 2012  . Polysubstance abuse (HCC)    etoh, cocaine, heroin  . Stroke [facility]) 2008   "they said I might have had one during right hip replacement" (04/21/2012)  . Wears glasses     Family History  Problem Relation Age of Onset  . Diabetes Father     Past Surgical History:  Procedure Laterality Date  . AMPUTATION Right 06/09/2017   Procedure: RIGHT 2ND TOE AMPUTATION;  Surgeon: [name], MD;  Location: [facility];  Service: Orthopedics;  Laterality: Right;  . AMPUTATION Right 07/23/2017   Procedure: RIGHT 4TH TOE AMPUTATION;  Surgeon: [name], MD;  Location: [facility];  Service: Orthopedics;  Laterality: Right;  . AMPUTATION Right 06/15/2018   Procedure: RIGHT FOOT TRANSMETATARSAL AMPUTATION;  Surgeon: [name], MD;  Location: [facility];  Service: Orthopedics;  Laterality: Right;  right  . JOINT REPLACEMENT    . KNEE ARTHROSCOPY Bilateral 1980's/1990's  . LUNG LOBECTOMY Left ~ 2006  . LUNG LOBECTOMY    . METATARSAL OSTEOTOMY  10/29/2011   Procedure: METATARSAL OSTEOTOMY;  Surgeon: [name], MD;  Location: [facility];  Service: Orthopedics;  Laterality: Left;  Left 1st Metatarsal Dorsal Closing Wedge   . METATARSAL OSTEOTOMY Right 04/28/2017   Procedure: RIGHT 1ST METATARSAL DORSAL CLOSING WEDGE OSTEOTOMY;  Surgeon: [name], MD;  Location: [facility];  Service: Orthopedics;  Laterality: Right;  . MULTIPLE TOOTH EXTRACTIONS    . REVISION TOTAL HIP ARTHROPLASTY Right 2008   "4-5 months after replacement" (04/21/2012)  . TOTAL HIP ARTHROPLASTY Right 2008  . TOTAL  KNEE ARTHROPLASTY Left 2006  . TOTAL KNEE ARTHROPLASTY Right 04/20/2012  . TOTAL KNEE ARTHROPLASTY Right 04/20/2012   Procedure: TOTAL KNEE ARTHROPLASTY;  Surgeon: [name], MD;  Location: [facility];  Service: Orthopedics;  Laterality: Right;  Right Total Knee Arthroplasty   Social History   Occupational History  . Not on file  Tobacco Use  . Smoking status: Current Every Day Smoker    Packs/day: 1.00    Years: 30.00    Pack years: 30.00    Types: Cigarettes  . Smokeless tobacco: Never Used  Substance and Sexual Activity  . Alcohol use: Not Currently    Alcohol/week: 0.0 standard drinks    Frequency: Never  . Drug use: Not Currently    Types: Cocaine    Comment: Cocaine and heroin - 10 years ago  . Sexual activity: Yes    Birth control/protection: None

## 2018-08-17 ENCOUNTER — Telehealth: Payer: Self-pay | Admitting: Orthopedic Surgery

## 2018-08-17 NOTE — Telephone Encounter (Signed)
Received voicemail message from [place] (PT) with [facility] needing order to re-certify patient. Patient is receiving silvadene cream dressing to the left plantar ft diabetic ulcer. The number to contact Verdis Frederickson is [phone]

## 2018-08-17 NOTE — Telephone Encounter (Signed)
I called and sw HHn to advise ok for re-cert for services.

## 2018-08-25 ENCOUNTER — Ambulatory Visit: Payer: Medicare Other | Admitting: Orthopedic Surgery

## 2018-08-30 ENCOUNTER — Encounter: Payer: Self-pay | Admitting: Family

## 2018-08-30 ENCOUNTER — Other Ambulatory Visit: Payer: Self-pay

## 2018-08-30 ENCOUNTER — Ambulatory Visit (INDEPENDENT_AMBULATORY_CARE_PROVIDER_SITE_OTHER): Payer: Medicare Other | Admitting: Family

## 2018-08-30 VITALS — Ht 75.0 in | Wt 185.0 lb

## 2018-08-30 DIAGNOSIS — L97521 Non-pressure chronic ulcer of other part of left foot limited to breakdown of skin: Secondary | ICD-10-CM

## 2018-08-30 DIAGNOSIS — Z89431 Acquired absence of right foot: Secondary | ICD-10-CM

## 2018-08-30 DIAGNOSIS — E11621 Type 2 diabetes mellitus with foot ulcer: Secondary | ICD-10-CM

## 2018-08-30 DIAGNOSIS — L97529 Non-pressure chronic ulcer of other part of left foot with unspecified severity: Secondary | ICD-10-CM

## 2018-08-30 MED ORDER — METHOCARBAMOL 500 MG PO TABS: 500.0000 mg | ORAL_TABLET | Freq: Three times a day (TID) | ORAL | 0 refills | Status: DC | PRN

## 2018-08-30 NOTE — Progress Notes (Signed)
Office Visit Note   Patient: [name]           Date of Birth: May 23, 1965           MRN: [id number] Visit Date: 08/30/2018              Requested by: Lucia Gaskins, MD [address] [place],  [place] [address] PCP: Lucia Gaskins, MD  Chief Complaint  Patient presents with  . Left Foot - Wound Check  . Right Foot - Routine Post Op    06/15/18 right foot transmet amputation       HPI: The patient is a 53 year old gentleman seen today in routine follow-up for left foot ulceration.  Wagner grade 1 ulcer to the first metatarsal head on the left.  He is status post right transmetatarsal amputation.  He has been wearing a medical compression stocking over the right.  States he has been letting his left foot ulcer get more air feels this is helping improved.  Denies any fevers or chills or worsening of his symptoms.  Assessment & Plan: Visit Diagnoses:  1. Ulcer of left foot, limited to breakdown of skin ([place])   2. Type 2 diabetes mellitus with left diabetic foot ulcer ([place])   3. History of transmetatarsal amputation of right foot ([place])     Plan: After informed consent the left foot first metatarsal head ulceration was debrided with a 10 blade knife back to viable tissue.  Applied a Iodosorb dressing.  He will continue daily wound cleansing offloading the forefoot is much as he is able.  Continue the compression garments bilaterally.  Silvadene dressing changes to the left foot.  Follow-Up Instructions: Return in about 4 weeks (around 09/27/2018).   Ortho Exam  Patient is alert, oriented, no adenopathy, well-dressed, normal affect, normal respiratory effort. The right transmetatarsal amputation site is well-healed.  There are no signs of cellulitis or infection.  He has palpable pedal pulses.  There is less erythema and edema of the residual foot. On examination of the left foot he does have a plantar ulceration beneath the fifth first metatarsal head this is 2 cm in  diameter primarily callused.  This was debrided of skin and soft tissue.  There is an underlying 5 mm in diameter ulceration this is 3 mm deep this does not probe to bone or tendon.  There is no active drainage no surrounding erythema or maceration.  Imaging: No results found. No images are attached to the encounter.  Labs: Lab Results  Component Value Date   HGBA1C 6.2 (H) 06/13/2018   HGBA1C 6.7 (H) 06/07/2017   HGBA1C 10.3 (H) 08/17/2016   ESRSEDRATE 30 (H) 06/13/2018   ESRSEDRATE 5 06/04/2017   ESRSEDRATE 110 (H) 12/19/2013   CRP 5.7 (H) 06/13/2018   CRP <0.8 06/05/2017   CRP 12.2 (H) 12/19/2013   REPTSTATUS 12/24/2013 FINAL 12/18/2013   REPTSTATUS 12/24/2013 FINAL 12/18/2013   GRAMSTAIN  04/19/2011    WBC PRESENT,BOTH PMN AND MONONUCLEAR NO ORGANISMS SEEN   CULT  12/18/2013    NO GROWTH 5 DAYS Performed at [facility]  12/18/2013    NO GROWTH 5 DAYS Performed at [facility]      Lab Results  Component Value Date   ALBUMIN 2.9 (L) 06/15/2018   ALBUMIN 2.8 (L) 06/14/2018   ALBUMIN 3.9 06/12/2018    Lab Results  Component Value Date   MG 1.9 06/15/2018   MG 1.9 06/14/2018   MG  2.0 08/16/2016   No results found for: VD25OH  No results found for: PREALBUMIN CBC EXTENDED Latest Ref Rng & Units 06/17/2018 06/16/2018 06/15/2018  WBC 4.0 - 10.5 K/uL 7.7 - 5.6  RBC 4.22 - 5.81 MIL/uL 4.69 - 4.47  HGB 13.0 - 17.0 g/dL 13.0 12.0(L) 12.2(L)  HCT 39.0 - 52.0 % 39.0 36.4(L) 36.9(L)  PLT 150 - 400 K/uL 232 - 237  NEUTROABS 1.7 - 7.7 K/uL - - -  LYMPHSABS 0.7 - 4.0 K/uL - - -     Body mass index is 23.12 kg/m.  Orders:  No orders of the defined types were placed in this encounter.  Meds ordered this encounter  Medications  . methocarbamol (ROBAXIN) 500 MG tablet    Sig: Take 1 tablet (500 mg total) by mouth every 8 (eight) hours as needed for muscle spasms.    Dispense:  30 tablet    Refill:  0     Procedures: No procedures  performed  Clinical Data: No additional findings.  ROS:  All other systems negative, except as noted in the HPI. Review of Systems  Constitutional: Negative for chills and fever.  Skin: Positive for wound. Negative for color change.    Objective: Vital Signs: Ht 6\' 3"  (1.905 m)   Wt 185 lb (83.9 kg)   BMI 23.12 kg/m   Specialty Comments:  No specialty comments available.  PMFS History: Patient Active Problem List   Diagnosis Date Noted  . History of transmetatarsal amputation of right foot ([place]) 08/30/2018  . Charcot foot due to diabetes mellitus ([place])   . Cellulitis 06/12/2018  . Diabetic foot infection ([place]) 06/22/2017  . Post op infection 06/22/2017  . SVT (supraventricular tachycardia) ([place]) 08/17/2016  . Tobacco use disorder 10/25/2014  . Diabetes mellitus type 2 in nonobese (HCC)   . Swelling   . Type 2 diabetes mellitus with left diabetic foot ulcer ([place]) 12/18/2013  . Diabetes mellitus due to underlying condition with foot ulcer ([place]) 12/18/2013  . Left leg cellulitis 12/18/2013  . Cocaine abuse ([place]) 03/29/2012  . Generalized anxiety disorder 03/29/2012  . Panic attacks 03/29/2012  . Alcohol dependence ([place]) 08/05/2011    Class: Chronic  . Substance abuse ([place]) 05/31/2011  . Ulcer of left foot, limited to breakdown of skin ([place]) 02/15/2011   Past Medical History:  Diagnosis Date  . Anxiety   . Arthritis    "everywhere" (04/21/2012)  . Cellulitis and abscess of foot 12/19/2013   LEFT FOOT  . Cellulitis of right foot   . Chronic pain   . DDD (degenerative disc disease)   . Depression   . Diabetes mellitus without complication (HCC)    borderline  . Diabetic foot ulcer ([place])   . ETOH abuse   . Full dentures   . GERD (gastroesophageal reflux disease)    tums  . H/O amputation of lesser toe, right ([place]) 07/29/2017  . History of blood transfusion    "related to left knee OR; probably right hip too" (04/21/2012)  . Mental disorder   . Neuromuscular  disorder (HCC)    neuropathy  . Noncompliance   . Open wound    bottom of foot  . Osteomyelitis ([place])    right 4th toe  . Osteomyelitis of right foot ([place])   . Pneumonia ~ 2012  . Polysubstance abuse (HCC)    etoh, cocaine, heroin  . Stroke [facility]) 2008   "they said I might have had one during right hip replacement" (04/21/2012)  .  Wears glasses     Family History  Problem Relation Age of Onset  . Diabetes Father     Past Surgical History:  Procedure Laterality Date  . AMPUTATION Right 06/09/2017   Procedure: RIGHT 2ND TOE AMPUTATION;  Surgeon: [name], MD;  Location: [facility];  Service: Orthopedics;  Laterality: Right;  . AMPUTATION Right 07/23/2017   Procedure: RIGHT 4TH TOE AMPUTATION;  Surgeon: [name], MD;  Location: [facility];  Service: Orthopedics;  Laterality: Right;  . AMPUTATION Right 06/15/2018   Procedure: RIGHT FOOT TRANSMETATARSAL AMPUTATION;  Surgeon: [name], MD;  Location: [facility];  Service: Orthopedics;  Laterality: Right;  right  . JOINT REPLACEMENT    . KNEE ARTHROSCOPY Bilateral 1980's/1990's  . LUNG LOBECTOMY Left ~ 2006  . LUNG LOBECTOMY    . METATARSAL OSTEOTOMY  10/29/2011   Procedure: METATARSAL OSTEOTOMY;  Surgeon: [name], MD;  Location: [facility] [place];  Service: Orthopedics;  Laterality: Left;  Left 1st Metatarsal Dorsal Closing Wedge   . METATARSAL OSTEOTOMY Right 04/28/2017   Procedure: RIGHT 1ST METATARSAL DORSAL CLOSING WEDGE OSTEOTOMY;  Surgeon: [name], MD;  Location: [facility];  Service: Orthopedics;  Laterality: Right;  . MULTIPLE TOOTH EXTRACTIONS    . REVISION TOTAL HIP ARTHROPLASTY Right 2008   "4-5 months after replacement" (04/21/2012)  . TOTAL HIP ARTHROPLASTY Right 2008  . TOTAL KNEE ARTHROPLASTY Left 2006  . TOTAL KNEE ARTHROPLASTY Right 04/20/2012  . TOTAL KNEE ARTHROPLASTY Right 04/20/2012   Procedure: TOTAL KNEE ARTHROPLASTY;  Surgeon: [name], MD;  Location: [facility];  Service: Orthopedics;  Laterality: Right;  Right Total  Knee Arthroplasty   Social History   Occupational History  . Not on file  Tobacco Use  . Smoking status: Current Every Day Smoker    Packs/day: 1.00    Years: 30.00    Pack years: 30.00    Types: Cigarettes  . Smokeless tobacco: Never Used  Substance and Sexual Activity  . Alcohol use: Not Currently    Alcohol/week: 0.0 standard drinks    Frequency: Never  . Drug use: Not Currently    Types: Cocaine    Comment: Cocaine and heroin - 10 years ago  . Sexual activity: Yes    Birth control/protection: None

## 2018-09-08 ENCOUNTER — Telehealth: Payer: Self-pay

## 2018-09-08 NOTE — Telephone Encounter (Signed)
[name] with [facility] would like a Rx faxed for patient with ICD-10 codes.  Fax number is [phone].  Please advise.  Thank you

## 2018-09-08 NOTE — Telephone Encounter (Signed)
Order written for extra depth shoes and orthotics hx right transmet amputation and left foot ulcerations Z89.431, L97.521

## 2018-09-19 ENCOUNTER — Telehealth: Payer: Self-pay | Admitting: Orthopedic Surgery

## 2018-09-19 NOTE — Telephone Encounter (Signed)
Received voicemail message from Shirlean Mylar with [facility] left message needing a call back concerning medications from June and July. The number to contact patient is [phone]

## 2018-09-20 NOTE — Telephone Encounter (Signed)
I called and lm on vm to advise that I need her to call back with details specifically what it is that she needs and I will be happy to take care of that for her.

## 2018-09-21 ENCOUNTER — Telehealth: Payer: Self-pay | Admitting: Orthopedic Surgery

## 2018-09-21 NOTE — Telephone Encounter (Signed)
Robbin from [facility] called wanting to speak with you in regards to this patient getting Oxycodone 10mg  qty of 120 pills from another provider.  CB#[phone].  Thank you.

## 2018-09-21 NOTE — Telephone Encounter (Signed)
I tried to return call regarding this message, lvm for [name] to return callback.

## 2018-09-22 ENCOUNTER — Telehealth: Payer: Self-pay | Admitting: Orthopedic Surgery

## 2018-09-22 NOTE — Telephone Encounter (Signed)
Called and HHN states that she is using silvadene to the other open areas and advised that it is ok to use this on the new area as well. The pt has an appt on 09/27/18 any changes or updates in orders will give her a call.

## 2018-09-22 NOTE — Telephone Encounter (Signed)
Received call from [facility] with [facility] stating patient has a new diabetic ulcer on the bottom of his right foot and she need wound care orders. The number to contact Janett Billow is [phone]

## 2018-09-27 ENCOUNTER — Other Ambulatory Visit: Payer: Self-pay

## 2018-09-27 ENCOUNTER — Encounter: Payer: Self-pay | Admitting: Orthopedic Surgery

## 2018-09-27 ENCOUNTER — Ambulatory Visit (INDEPENDENT_AMBULATORY_CARE_PROVIDER_SITE_OTHER): Payer: Medicare Other | Admitting: Orthopedic Surgery

## 2018-09-27 VITALS — Ht 75.0 in | Wt 185.0 lb

## 2018-09-27 DIAGNOSIS — E11621 Type 2 diabetes mellitus with foot ulcer: Secondary | ICD-10-CM

## 2018-09-27 DIAGNOSIS — L97529 Non-pressure chronic ulcer of other part of left foot with unspecified severity: Secondary | ICD-10-CM | POA: Diagnosis not present

## 2018-09-27 DIAGNOSIS — L97521 Non-pressure chronic ulcer of other part of left foot limited to breakdown of skin: Secondary | ICD-10-CM | POA: Diagnosis not present

## 2018-09-27 DIAGNOSIS — L97511 Non-pressure chronic ulcer of other part of right foot limited to breakdown of skin: Secondary | ICD-10-CM

## 2018-09-27 DIAGNOSIS — Z89431 Acquired absence of right foot: Secondary | ICD-10-CM

## 2018-09-27 MED ORDER — SULFAMETHOXAZOLE-TRIMETHOPRIM 800-160 MG PO TABS: 1.0000 | ORAL_TABLET | Freq: Two times a day (BID) | ORAL | 0 refills | Status: DC

## 2018-09-27 NOTE — Progress Notes (Signed)
Office Visit Note   Patient: [name]           Date of Birth: April 22, 1965           MRN: [id number] Visit Date: 09/27/2018              Requested by: Lucia Gaskins, MD [address] [place],  [place] [address] PCP: Lucia Gaskins, MD  Chief Complaint  Patient presents with  . Right Foot - Routine Post Op  . Left Foot - Wound Check      HPI: Patient is a 53 year old gentleman who is status post transmetatarsal amputation on the right who has a new ulcer on the plantar aspect of the right foot.  Patient also has a persistent chronic ulcer beneath the first metatarsal head of the left foot.  Patient states that the ulcer on the right foot started about 4 days ago.  He states he does have some clear drainage.  Assessment & Plan: Visit Diagnoses:  1. Ulcer of left foot, limited to breakdown of skin ([place])   2. History of transmetatarsal amputation of right foot ([place])   3. Type 2 diabetes mellitus with left diabetic foot ulcer ([place])   4. Non-pressure chronic ulcer of other part of right foot limited to breakdown of skin ([place])     Plan: Prescription called in for sulfamethoxazole trimethoprim.  Minimize weightbearing on both feet dry dressing changes daily.  I am concerned with the new ulcer on the right foot.  Follow-Up Instructions: Return in about 1 week (around 10/04/2018).   Ortho Exam  Patient is alert, oriented, no adenopathy, well-dressed, normal affect, normal respiratory effort. Examination patient has some mild cellulitis and maceration of the plantar aspect of the right transmetatarsal amputation.  After informed consent a 10 blade knife was used to debride the skin and soft tissue back to healthy viable granulation tissue that was bleeding this was touched with silver nitrate the ulcer is 5 mm in depth 10 mm in diameter this does probe to fascia but does not probe to bone.  Examination of the left foot he has a Wegner grade 1 ulcer beneath the first metatarsal  head.  After informed consent a 10 blade knife was used to debride the skin and soft tissue back to healthy viable granulation tissue this was touched with silver nitrate the ulcer is 3 cm in diameter 1 mm deep no exposed bone or tendon no cellulitis.  Imaging: No results found.  Will start patient on sulfamethoxazole trimethoprim for the right foot ulcer minimize weightbearing dressing changes daily follow-up in 1 week. No images are attached to the encounter.  Labs: Lab Results  Component Value Date   HGBA1C 6.2 (H) 06/13/2018   HGBA1C 6.7 (H) 06/07/2017   HGBA1C 10.3 (H) 08/17/2016   ESRSEDRATE 30 (H) 06/13/2018   ESRSEDRATE 5 06/04/2017   ESRSEDRATE 110 (H) 12/19/2013   CRP 5.7 (H) 06/13/2018   CRP <0.8 06/05/2017   CRP 12.2 (H) 12/19/2013   REPTSTATUS 12/24/2013 FINAL 12/18/2013   REPTSTATUS 12/24/2013 FINAL 12/18/2013   GRAMSTAIN  04/19/2011    WBC PRESENT,BOTH PMN AND MONONUCLEAR NO ORGANISMS SEEN   CULT  12/18/2013    NO GROWTH 5 DAYS Performed at [facility]  12/18/2013    NO GROWTH 5 DAYS Performed at [facility]      Lab Results  Component Value Date   ALBUMIN 2.9 (L) 06/15/2018   ALBUMIN 2.8 (L) 06/14/2018  ALBUMIN 3.9 06/12/2018    Lab Results  Component Value Date   MG 1.9 06/15/2018   MG 1.9 06/14/2018   MG 2.0 08/16/2016   No results found for: VD25OH  No results found for: PREALBUMIN CBC EXTENDED Latest Ref Rng & Units 06/17/2018 06/16/2018 06/15/2018  WBC 4.0 - 10.5 K/uL 7.7 - 5.6  RBC 4.22 - 5.81 MIL/uL 4.69 - 4.47  HGB 13.0 - 17.0 g/dL 13.0 12.0(L) 12.2(L)  HCT 39.0 - 52.0 % 39.0 36.4(L) 36.9(L)  PLT 150 - 400 K/uL 232 - 237  NEUTROABS 1.7 - 7.7 K/uL - - -  LYMPHSABS 0.7 - 4.0 K/uL - - -     There is no height or weight on file to calculate BMI.  Orders:  No orders of the defined types were placed in this encounter.  Meds ordered this encounter  Medications  . sulfamethoxazole-trimethoprim (BACTRIM DS)  800-160 MG tablet    Sig: Take 1 tablet by mouth 2 (two) times daily.    Dispense:  60 tablet    Refill:  0     Procedures: No procedures performed  Clinical Data: No additional findings.  ROS:  All other systems negative, except as noted in the HPI. Review of Systems  Objective: Vital Signs: There were no vitals taken for this visit.  Specialty Comments:  No specialty comments available.  PMFS History: Patient Active Problem List   Diagnosis Date Noted  . History of transmetatarsal amputation of right foot ([place]) 08/30/2018  . Charcot foot due to diabetes mellitus ([place])   . Cellulitis 06/12/2018  . Diabetic foot infection ([place]) 06/22/2017  . Post op infection 06/22/2017  . SVT (supraventricular tachycardia) ([place]) 08/17/2016  . Tobacco use disorder 10/25/2014  . Diabetes mellitus type 2 in nonobese (HCC)   . Swelling   . Type 2 diabetes mellitus with left diabetic foot ulcer ([place]) 12/18/2013  . Diabetes mellitus due to underlying condition with foot ulcer ([place]) 12/18/2013  . Left leg cellulitis 12/18/2013  . Cocaine abuse ([place]) 03/29/2012  . Generalized anxiety disorder 03/29/2012  . Panic attacks 03/29/2012  . Alcohol dependence ([place]) 08/05/2011    Class: Chronic  . Substance abuse ([place]) 05/31/2011  . Ulcer of left foot, limited to breakdown of skin ([place]) 02/15/2011   Past Medical History:  Diagnosis Date  . Anxiety   . Arthritis    "everywhere" (04/21/2012)  . Cellulitis and abscess of foot 12/19/2013   LEFT FOOT  . Cellulitis of right foot   . Chronic pain   . DDD (degenerative disc disease)   . Depression   . Diabetes mellitus without complication (HCC)    borderline  . Diabetic foot ulcer ([place])   . ETOH abuse   . Full dentures   . GERD (gastroesophageal reflux disease)    tums  . H/O amputation of lesser toe, right ([place]) 07/29/2017  . History of blood transfusion    "related to left knee OR; probably right hip too" (04/21/2012)  . Mental disorder   .  Neuromuscular disorder (HCC)    neuropathy  . Noncompliance   . Open wound    bottom of foot  . Osteomyelitis ([place])    right 4th toe  . Osteomyelitis of right foot ([place])   . Pneumonia ~ 2012  . Polysubstance abuse (HCC)    etoh, cocaine, heroin  . Stroke [facility]) 2008   "they said I might have had one during right hip replacement" (04/21/2012)  . Wears glasses  Family History  Problem Relation Age of Onset  . Diabetes Father     Past Surgical History:  Procedure Laterality Date  . AMPUTATION Right 06/09/2017   Procedure: RIGHT 2ND TOE AMPUTATION;  Surgeon: [name], MD;  Location: [place];  Service: Orthopedics;  Laterality: Right;  . AMPUTATION Right 07/23/2017   Procedure: RIGHT 4TH TOE AMPUTATION;  Surgeon: [name], MD;  Location: [facility];  Service: Orthopedics;  Laterality: Right;  . AMPUTATION Right 06/15/2018   Procedure: RIGHT FOOT TRANSMETATARSAL AMPUTATION;  Surgeon: [name], MD;  Location: [facility];  Service: Orthopedics;  Laterality: Right;  right  . JOINT REPLACEMENT    . KNEE ARTHROSCOPY Bilateral 1980's/1990's  . LUNG LOBECTOMY Left ~ 2006  . LUNG LOBECTOMY    . METATARSAL OSTEOTOMY  10/29/2011   Procedure: METATARSAL OSTEOTOMY;  Surgeon: [name], MD;  Location: [facility];  Service: Orthopedics;  Laterality: Left;  Left 1st Metatarsal Dorsal Closing Wedge   . METATARSAL OSTEOTOMY Right 04/28/2017   Procedure: RIGHT 1ST METATARSAL DORSAL CLOSING WEDGE OSTEOTOMY;  Surgeon: [name], MD;  Location: [facility];  Service: Orthopedics;  Laterality: Right;  . MULTIPLE TOOTH EXTRACTIONS    . REVISION TOTAL HIP ARTHROPLASTY Right 2008   "4-5 months after replacement" (04/21/2012)  . TOTAL HIP ARTHROPLASTY Right 2008  . TOTAL KNEE ARTHROPLASTY Left 2006  . TOTAL KNEE ARTHROPLASTY Right 04/20/2012  . TOTAL KNEE ARTHROPLASTY Right 04/20/2012   Procedure: TOTAL KNEE ARTHROPLASTY;  Surgeon: [name], MD;  Location: [facility];  Service: Orthopedics;  Laterality: Right;   Right Total Knee Arthroplasty   Social History   Occupational History  . Not on file  Tobacco Use  . Smoking status: Current Every Day Smoker    Packs/day: 1.00    Years: 30.00    Pack years: 30.00    Types: Cigarettes  . Smokeless tobacco: Never Used  Substance and Sexual Activity  . Alcohol use: Not Currently    Alcohol/week: 0.0 standard drinks    Frequency: Never  . Drug use: Not Currently    Types: Cocaine    Comment: Cocaine and heroin - 10 years ago  . Sexual activity: Yes    Birth control/protection: None

## 2018-09-28 ENCOUNTER — Telehealth: Payer: Self-pay | Admitting: Orthopedic Surgery

## 2018-09-28 NOTE — Telephone Encounter (Signed)
Robin from [facility] called. She says patient has duplicate scripts for pain meds. She needs clarification. Please call her at [phone]. Thanks

## 2018-09-29 ENCOUNTER — Ambulatory Visit: Payer: Medicare Other | Admitting: Orthopedic Surgery

## 2018-09-29 ENCOUNTER — Telehealth: Payer: Self-pay

## 2018-09-29 NOTE — Telephone Encounter (Signed)
Patient HHN was called and informed that Dr Sharol Given is aware of patient's right foot ucler. [facility] was also called and noted that Dr Sharol Given does not prescribe any pain medications at this time.

## 2018-09-29 NOTE — Telephone Encounter (Signed)
Lamount Cohen With [facility] called stating that patient has a hole in the middle of his right foot. Stated that patient is taking his antibiotics and states that he will be okay until his appointment on Tuesday, 10/04/2018.  Wanted to make Dr. Sharol Given aware.  Cb# is [phone].  Thank you.

## 2018-09-30 NOTE — Telephone Encounter (Signed)
[name] was called and was informed that Dr Sharol Given did prescribe for patient to have pain medications during surgery in 05/2018 for a right foot TMA. [name] understands that this Rx was only given due to post-op.

## 2018-10-04 ENCOUNTER — Ambulatory Visit: Payer: Medicare Other

## 2018-10-04 ENCOUNTER — Ambulatory Visit (INDEPENDENT_AMBULATORY_CARE_PROVIDER_SITE_OTHER): Payer: Medicare Other | Admitting: Family

## 2018-10-04 ENCOUNTER — Encounter: Payer: Self-pay | Admitting: Family

## 2018-10-04 VITALS — Ht 75.0 in | Wt 185.0 lb

## 2018-10-04 DIAGNOSIS — R200 Anesthesia of skin: Secondary | ICD-10-CM

## 2018-10-04 MED ORDER — PREDNISONE 50 MG PO TABS: ORAL_TABLET | ORAL | 0 refills | Status: DC

## 2018-10-04 NOTE — Progress Notes (Signed)
Office Visit Note   Patient: [name]           Date of Birth: 1965/06/03           MRN: [id number] Visit Date: 10/04/2018              Requested by: Lucia Gaskins, MD [address] [place],  [place] [address] PCP: Lucia Gaskins, MD  Chief Complaint  Patient presents with  . Right Foot - Follow-up    06/15/18 right foot transmet new ulcer       HPI: The patient is a 53 year old gentleman seen today for evaluation of 3 separate issues.  He is status post transmetatarsal amputation of the right foot.  He has a chronic ulcer to the plantar aspect of his foot and is now having some blisters extension to his forefoot over the transmetatarsal amputation.  He is worried for infection.  Is currently completing a antibiotic course.  He also has a chronic wound to the plantar aspect of his left foot and is here in routine follow-up for this.  He is having a new issue with his left upper extremity.  He is having weakness of his hand.  Has been seen recently for evaluation in the emergency department states that he was evaluated for stroke and all of his scans were negative.  Did not have a stroke.  Was told that he had a pinched nerve causing his numbness tingling and weakness of the left upper extremity.  Has extension to the biceps.   Assessment & Plan: Visit Diagnoses:  1. Numbness of upper extremity     Plan: Radiographs of his neck performed here in office today were negative for acute finding.  We will trial a prednisone taper.  Continue current wound care of bilateral feet.  Follow-Up Instructions: Return in about 2 weeks (around 10/18/2018).   Back Exam   Tenderness  The patient is experiencing no tenderness.   Comments:  Negative spurlings   Left Hand Exam   Tenderness  The patient is experiencing no tenderness.   Muscle Strength  Grip:  2/5   Other  Erythema: absent   Left Shoulder Exam  Left shoulder exam is normal.      Patient is alert,  oriented, no adenopathy, well-dressed, normal affect, normal respiratory effort. Examination patient has callus and some mild maceration of the plantar aspect of the right transmetatarsal amputation.  After informed consent a 10 blade knife was used to debride the skin and soft tissue back to healthy viable granulation tissue that was bleeding this was touched with silver nitrate the ulcer is 5 mm in depth 10 mm in diameter this does not probe to bone.  Examination of the left foot he has a Wagner grade 1 ulcer beneath the first metatarsal head.  After informed consent a 10 blade knife was used to debride the skin and soft tissue back to healthy viable granulation tissue this was touched with silver nitrate the ulcer is 37mm  in diameter 1 mm deep no exposed bone or tendon no cellulitis.  Imaging: No results found. No images are attached to the encounter.  Labs: Lab Results  Component Value Date   HGBA1C 6.2 (H) 06/13/2018   HGBA1C 6.7 (H) 06/07/2017   HGBA1C 10.3 (H) 08/17/2016   ESRSEDRATE 30 (H) 06/13/2018   ESRSEDRATE 5 06/04/2017   ESRSEDRATE 110 (H) 12/19/2013   CRP 5.7 (H) 06/13/2018   CRP <0.8 06/05/2017   CRP 12.2 (H) 12/19/2013  REPTSTATUS 12/24/2013 FINAL 12/18/2013   REPTSTATUS 12/24/2013 FINAL 12/18/2013   GRAMSTAIN  04/19/2011    WBC PRESENT,BOTH PMN AND MONONUCLEAR NO ORGANISMS SEEN   CULT  12/18/2013    NO GROWTH 5 DAYS Performed at [facility]  12/18/2013    NO GROWTH 5 DAYS Performed at [facility]      Lab Results  Component Value Date   ALBUMIN 2.9 (L) 06/15/2018   ALBUMIN 2.8 (L) 06/14/2018   ALBUMIN 3.9 06/12/2018    Lab Results  Component Value Date   MG 1.9 06/15/2018   MG 1.9 06/14/2018   MG 2.0 08/16/2016   No results found for: VD25OH  No results found for: PREALBUMIN CBC EXTENDED Latest Ref Rng & Units 06/17/2018 06/16/2018 06/15/2018  WBC 4.0 - 10.5 K/uL 7.7 - 5.6  RBC 4.22 - 5.81 MIL/uL 4.69 - 4.47  HGB 13.0 -  17.0 g/dL 13.0 12.0(L) 12.2(L)  HCT 39.0 - 52.0 % 39.0 36.4(L) 36.9(L)  PLT 150 - 400 K/uL 232 - 237  NEUTROABS 1.7 - 7.7 K/uL - - -  LYMPHSABS 0.7 - 4.0 K/uL - - -     Body mass index is 23.12 kg/m.  Orders:  Orders Placed This Encounter  Procedures  . XR Cervical Spine 2 or 3 views   Meds ordered this encounter  Medications  . predniSONE (DELTASONE) 50 MG tablet    Sig: Take one tablet by mouth once daily for 5 days.    Dispense:  5 tablet    Refill:  0     Procedures: No procedures performed  Clinical Data: No additional findings.  ROS:  All other systems negative, except as noted in the HPI. Review of Systems  Constitutional: Negative for chills and fever.  Musculoskeletal: Negative for neck pain and neck stiffness.  Skin: Positive for color change and wound.  Neurological: Positive for weakness and numbness.    Objective: Vital Signs: Ht 6\' 3"  (1.905 m)   Wt 185 lb (83.9 kg)   BMI 23.12 kg/m   Specialty Comments:  No specialty comments available.  PMFS History: Patient Active Problem List   Diagnosis Date Noted  . History of transmetatarsal amputation of right foot ([place]) 08/30/2018  . Charcot foot due to diabetes mellitus ([place])   . Cellulitis 06/12/2018  . Diabetic foot infection ([place]) 06/22/2017  . Post op infection 06/22/2017  . SVT (supraventricular tachycardia) ([place]) 08/17/2016  . Tobacco use disorder 10/25/2014  . Diabetes mellitus type 2 in nonobese (HCC)   . Swelling   . Type 2 diabetes mellitus with left diabetic foot ulcer ([place]) 12/18/2013  . Diabetes mellitus due to underlying condition with foot ulcer ([place]) 12/18/2013  . Left leg cellulitis 12/18/2013  . Cocaine abuse ([place]) 03/29/2012  . Generalized anxiety disorder 03/29/2012  . Panic attacks 03/29/2012  . Alcohol dependence ([place]) 08/05/2011    Class: Chronic  . Substance abuse ([place]) 05/31/2011  . Ulcer of left foot, limited to breakdown of skin ([place]) 02/15/2011   Past Medical  History:  Diagnosis Date  . Anxiety   . Arthritis    "everywhere" (04/21/2012)  . Cellulitis and abscess of foot 12/19/2013   LEFT FOOT  . Cellulitis of right foot   . Chronic pain   . DDD (degenerative disc disease)   . Depression   . Diabetes mellitus without complication (HCC)    borderline  . Diabetic foot ulcer ([place])   . ETOH abuse   .  Full dentures   . GERD (gastroesophageal reflux disease)    tums  . H/O amputation of lesser toe, right ([place]) 07/29/2017  . History of blood transfusion    "related to left knee OR; probably right hip too" (04/21/2012)  . Mental disorder   . Neuromuscular disorder (HCC)    neuropathy  . Noncompliance   . Open wound    bottom of foot  . Osteomyelitis ([place])    right 4th toe  . Osteomyelitis of right foot ([place])   . Pneumonia ~ 2012  . Polysubstance abuse (HCC)    etoh, cocaine, heroin  . Stroke [facility]) 2008   "they said I might have had one during right hip replacement" (04/21/2012)  . Wears glasses     Family History  Problem Relation Age of Onset  . Diabetes Father     Past Surgical History:  Procedure Laterality Date  . AMPUTATION Right 06/09/2017   Procedure: RIGHT 2ND TOE AMPUTATION;  Surgeon: [name], MD;  Location: [place];  Service: Orthopedics;  Laterality: Right;  . AMPUTATION Right 07/23/2017   Procedure: RIGHT 4TH TOE AMPUTATION;  Surgeon: [name], MD;  Location: [facility];  Service: Orthopedics;  Laterality: Right;  . AMPUTATION Right 06/15/2018   Procedure: RIGHT FOOT TRANSMETATARSAL AMPUTATION;  Surgeon: [name], MD;  Location: [facility];  Service: Orthopedics;  Laterality: Right;  right  . JOINT REPLACEMENT    . KNEE ARTHROSCOPY Bilateral 1980's/1990's  . LUNG LOBECTOMY Left ~ 2006  . LUNG LOBECTOMY    . METATARSAL OSTEOTOMY  10/29/2011   Procedure: METATARSAL OSTEOTOMY;  Surgeon: [name], MD;  Location: [facility];  Service: Orthopedics;  Laterality: Left;  Left 1st Metatarsal Dorsal Closing Wedge   . METATARSAL  OSTEOTOMY Right 04/28/2017   Procedure: RIGHT 1ST METATARSAL DORSAL CLOSING WEDGE OSTEOTOMY;  Surgeon: [name], MD;  Location: [facility];  Service: Orthopedics;  Laterality: Right;  . MULTIPLE TOOTH EXTRACTIONS    . REVISION TOTAL HIP ARTHROPLASTY Right 2008   "4-5 months after replacement" (04/21/2012)  . TOTAL HIP ARTHROPLASTY Right 2008  . TOTAL KNEE ARTHROPLASTY Left 2006  . TOTAL KNEE ARTHROPLASTY Right 04/20/2012  . TOTAL KNEE ARTHROPLASTY Right 04/20/2012   Procedure: TOTAL KNEE ARTHROPLASTY;  Surgeon: [name], MD;  Location: [facility];  Service: Orthopedics;  Laterality: Right;  Right Total Knee Arthroplasty   Social History   Occupational History  . Not on file  Tobacco Use  . Smoking status: Current Every Day Smoker    Packs/day: 1.00    Years: 30.00    Pack years: 30.00    Types: Cigarettes  . Smokeless tobacco: Never Used  Substance and Sexual Activity  . Alcohol use: Not Currently    Alcohol/week: 0.0 standard drinks    Frequency: Never  . Drug use: Not Currently    Types: Cocaine    Comment: Cocaine and heroin - 10 years ago  . Sexual activity: Yes    Birth control/protection: None

## 2018-10-12 ENCOUNTER — Other Ambulatory Visit: Payer: Self-pay | Admitting: Physician Assistant

## 2018-10-12 ENCOUNTER — Telehealth: Payer: Self-pay

## 2018-10-12 DIAGNOSIS — Z89431 Acquired absence of right foot: Secondary | ICD-10-CM

## 2018-10-12 NOTE — Telephone Encounter (Signed)
[name] with [facility] called to recert pt for wound care orders - plantar foot wounds silvadene dressing changes three times a week. Verbal ok for this. To call with any questions.

## 2018-10-13 ENCOUNTER — Other Ambulatory Visit: Payer: Self-pay

## 2018-10-13 ENCOUNTER — Telehealth: Payer: Self-pay

## 2018-10-13 DIAGNOSIS — Z89431 Acquired absence of right foot: Secondary | ICD-10-CM

## 2018-10-13 MED ORDER — SILVER SULFADIAZINE 1 % EX CREA: 1.0000 "application " | TOPICAL_CREAM | Freq: Every day | CUTANEOUS | 0 refills | Status: DC

## 2018-10-13 NOTE — Telephone Encounter (Signed)
Patient called concerning a Rx for Silvadene.  Would like for Rx to be sent to [facility] on [address] [place].  Cb# is [phone].  Please advise.  Thank you.

## 2018-10-13 NOTE — Telephone Encounter (Signed)
Dr Sharol Given has already sent Rx for this.

## 2018-10-18 ENCOUNTER — Ambulatory Visit (INDEPENDENT_AMBULATORY_CARE_PROVIDER_SITE_OTHER): Payer: Medicare Other | Admitting: Family

## 2018-10-18 ENCOUNTER — Encounter: Payer: Self-pay | Admitting: Family

## 2018-10-18 VITALS — Ht 75.0 in | Wt 185.0 lb

## 2018-10-18 DIAGNOSIS — Z89431 Acquired absence of right foot: Secondary | ICD-10-CM | POA: Diagnosis not present

## 2018-10-18 DIAGNOSIS — R29898 Other symptoms and signs involving the musculoskeletal system: Secondary | ICD-10-CM

## 2018-10-18 DIAGNOSIS — L97512 Non-pressure chronic ulcer of other part of right foot with fat layer exposed: Secondary | ICD-10-CM | POA: Diagnosis not present

## 2018-10-18 DIAGNOSIS — L97521 Non-pressure chronic ulcer of other part of left foot limited to breakdown of skin: Secondary | ICD-10-CM | POA: Diagnosis not present

## 2018-10-18 NOTE — Progress Notes (Signed)
Office Visit Note   Patient: [name]           Date of Birth: 12-04-1965           MRN: [id number] Visit Date: 10/18/2018              Requested by: Lucia Gaskins, MD [address] [place],   [address] PCP: Lucia Gaskins, MD  Chief Complaint  Patient presents with  . Right Foot - Follow-up    06/15/18 right transmet amputation       HPI: The patient is a 53 year old gentleman seen today in follow-up for routine wound concerns of his bilateral feet as well as for weakness to his left upper extremity.  He has been having issues with his left hand weakness with grip strength he is feels he cannot extend his fingers however he can grip and has not been having difficulty dropping things he is in a wrist splint.  He was evaluated at the [facility] emergency department reports had imaging including an MRI of his brain and was told he had not had a stroke was told that he likely had issues from a pinched nerve in residual effects in his left upper extremity.  We have attempted to obtain records from [facility] at his last visit 2 weeks ago these have not been able to be obtained.  Patient has yet to undergo a C-spine MRI which was ordered  However he has been having mild improvement in his range of motion and numbness sensation in his left hand.  Did complete a prednisone taper.  Also seen in follow-up today for chronic ulcer to his right transmetatarsal amputation as well as a chronic ulcer beneath his first metatarsal head on the left.  Assessment & Plan: Visit Diagnoses: No diagnosis found.  Plan: For the fever will continue with current therapy is there is marked improvement in the right foot.  Discussed the importance of minimizing weightbearing especially of the left foot as this wound is deeper.  For his left upper extremity will refer to hand therapy as well as proceed with imaging.  Consider EMGs of the left upper extremity.  Follow-Up Instructions: No follow-ups  on file.   Back Exam   Tenderness  The patient is experiencing no tenderness.   Comments:  Negative Spurling   Right Hand Exam   Muscle Strength  Grip: 5/5    Left Hand Exam   Range of Motion  Wrist  Extension: abnormal  Hand  MP Index: 40  MP Middle: 40  MP Ring: 40  MP Little: 40   Muscle Strength  Grip:  5/5   Other  Erythema: absent Sensation: decreased   Left Elbow Exam   Range of Motion  The patient has normal left elbow ROM.   Left Shoulder Exam  Left shoulder exam is normal.      Patient is alert, oriented, no adenopathy, well-dressed, normal affect, normal respiratory effort. On examination of the bilateral feet the right transmetatarsal amputation is well-healed the callus still ulceration surrounding his wound was easily debrided today underlying healthy skin.  There was no need to do debridement with a 10 blade today.  There is a ulcer to the plantar aspect beneath the amputation site this is continues to be 10 mm in diameter.  Unfortunately this does probe 1 cm deep.  There is scant clear drainage.  There is no surrounding maceration no odor no fluctuance no sign of infection to the left foot however beneath  the first metatarsal head he has chronic ulceration this is surrounded by soft tissue there is no surrounding maceration today central ulceration is just 10 mm in diameter there is no depth no probing.  No surrounding erythema no sign of infection  Imaging: No results found. No images are attached to the encounter.  Labs: Lab Results  Component Value Date   HGBA1C 6.2 (H) 06/13/2018   HGBA1C 6.7 (H) 06/07/2017   HGBA1C 10.3 (H) 08/17/2016   ESRSEDRATE 30 (H) 06/13/2018   ESRSEDRATE 5 06/04/2017   ESRSEDRATE 110 (H) 12/19/2013   CRP 5.7 (H) 06/13/2018   CRP <0.8 06/05/2017   CRP 12.2 (H) 12/19/2013   REPTSTATUS 12/24/2013 FINAL 12/18/2013   REPTSTATUS 12/24/2013 FINAL 12/18/2013   GRAMSTAIN  04/19/2011    WBC PRESENT,BOTH PMN AND  MONONUCLEAR NO ORGANISMS SEEN   CULT  12/18/2013    NO GROWTH 5 DAYS Performed at [facility]  12/18/2013    NO GROWTH 5 DAYS Performed at [facility]      Lab Results  Component Value Date   ALBUMIN 2.9 (L) 06/15/2018   ALBUMIN 2.8 (L) 06/14/2018   ALBUMIN 3.9 06/12/2018    Lab Results  Component Value Date   MG 1.9 06/15/2018   MG 1.9 06/14/2018   MG 2.0 08/16/2016   No results found for: VD25OH  No results found for: PREALBUMIN CBC EXTENDED Latest Ref Rng & Units 06/17/2018 06/16/2018 06/15/2018  WBC 4.0 - 10.5 K/uL 7.7 - 5.6  RBC 4.22 - 5.81 MIL/uL 4.69 - 4.47  HGB 13.0 - 17.0 g/dL 13.0 12.0(L) 12.2(L)  HCT 39.0 - 52.0 % 39.0 36.4(L) 36.9(L)  PLT 150 - 400 K/uL 232 - 237  NEUTROABS 1.7 - 7.7 K/uL - - -  LYMPHSABS 0.7 - 4.0 K/uL - - -     Body mass index is 23.12 kg/m.  Orders:  No orders of the defined types were placed in this encounter.  No orders of the defined types were placed in this encounter.    Procedures: No procedures performed  Clinical Data: No additional findings.  ROS:  All other systems negative, except as noted in the HPI. Review of Systems  Constitutional: Negative for chills and fever.  Cardiovascular: Negative for leg swelling.  Musculoskeletal: Positive for myalgias.  Skin: Positive for color change and wound.  Neurological: Positive for weakness and numbness.    Objective: Vital Signs: Ht 6\' 3"  (1.905 m)   Wt 185 lb (83.9 kg)   BMI 23.12 kg/m   Specialty Comments:  No specialty comments available.  PMFS History: Patient Active Problem List   Diagnosis Date Noted  . History of transmetatarsal amputation of right foot ([place]) 08/30/2018  . Charcot foot due to diabetes mellitus ([place])   . Cellulitis 06/12/2018  . Diabetic foot infection ([place]) 06/22/2017  . Post op infection 06/22/2017  . SVT (supraventricular tachycardia) ([place]) 08/17/2016  . Tobacco use disorder 10/25/2014  . Diabetes  mellitus type 2 in nonobese (HCC)   . Swelling   . Type 2 diabetes mellitus with left diabetic foot ulcer ([place]) 12/18/2013  . Diabetes mellitus due to underlying condition with foot ulcer ([place]) 12/18/2013  . Left leg cellulitis 12/18/2013  . Cocaine abuse ([place]) 03/29/2012  . Generalized anxiety disorder 03/29/2012  . Panic attacks 03/29/2012  . Alcohol dependence ([place]) 08/05/2011    Class: Chronic  . Substance abuse ([place]) 05/31/2011  . Ulcer of left foot, limited to breakdown of  skin ([place]) 02/15/2011   Past Medical History:  Diagnosis Date  . Anxiety   . Arthritis    "everywhere" (04/21/2012)  . Cellulitis and abscess of foot 12/19/2013   LEFT FOOT  . Cellulitis of right foot   . Chronic pain   . DDD (degenerative disc disease)   . Depression   . Diabetes mellitus without complication (HCC)    borderline  . Diabetic foot ulcer ([place])   . ETOH abuse   . Full dentures   . GERD (gastroesophageal reflux disease)    tums  . H/O amputation of lesser toe, right ([place]) 07/29/2017  . History of blood transfusion    "related to left knee OR; probably right hip too" (04/21/2012)  . Mental disorder   . Neuromuscular disorder (HCC)    neuropathy  . Noncompliance   . Open wound    bottom of foot  . Osteomyelitis ([place])    right 4th toe  . Osteomyelitis of right foot ([place])   . Pneumonia ~ 2012  . Polysubstance abuse (HCC)    etoh, cocaine, heroin  . Stroke [facility]) 2008   "they said I might have had one during right hip replacement" (04/21/2012)  . Wears glasses     Family History  Problem Relation Age of Onset  . Diabetes Father     Past Surgical History:  Procedure Laterality Date  . AMPUTATION Right 06/09/2017   Procedure: RIGHT 2ND TOE AMPUTATION;  Surgeon: [name], MD;  Location: [place];  Service: Orthopedics;  Laterality: Right;  . AMPUTATION Right 07/23/2017   Procedure: RIGHT 4TH TOE AMPUTATION;  Surgeon: [name], MD;  Location: [place];  Service: Orthopedics;   Laterality: Right;  . AMPUTATION Right 06/15/2018   Procedure: RIGHT FOOT TRANSMETATARSAL AMPUTATION;  Surgeon: [name], MD;  Location: [place];  Service: Orthopedics;  Laterality: Right;  right  . JOINT REPLACEMENT    . KNEE ARTHROSCOPY Bilateral 1980's/1990's  . LUNG LOBECTOMY Left ~ 2006  . LUNG LOBECTOMY    . METATARSAL OSTEOTOMY  10/29/2011   Procedure: METATARSAL OSTEOTOMY;  Surgeon: [name], MD;  Location: [place];  Service: Orthopedics;  Laterality: Left;  Left 1st Metatarsal Dorsal Closing Wedge   . METATARSAL OSTEOTOMY Right 04/28/2017   Procedure: RIGHT 1ST METATARSAL DORSAL CLOSING WEDGE OSTEOTOMY;  Surgeon: [name], MD;  Location: [place];  Service: Orthopedics;  Laterality: Right;  . MULTIPLE TOOTH EXTRACTIONS    . REVISION TOTAL HIP ARTHROPLASTY Right 2008   "4-5 months after replacement" (04/21/2012)  . TOTAL HIP ARTHROPLASTY Right 2008  . TOTAL KNEE ARTHROPLASTY Left 2006  . TOTAL KNEE ARTHROPLASTY Right 04/20/2012  . TOTAL KNEE ARTHROPLASTY Right 04/20/2012   Procedure: TOTAL KNEE ARTHROPLASTY;  Surgeon: [name], MD;  Location: [place];  Service: Orthopedics;  Laterality: Right;  Right Total Knee Arthroplasty   Social History   Occupational History  . Not on file  Tobacco Use  . Smoking status: Current Every Day Smoker    Packs/day: 1.00    Years: 30.00    Pack years: 30.00    Types: Cigarettes  . Smokeless tobacco: Never Used  Substance and Sexual Activity  . Alcohol use: Not Currently    Alcohol/week: 0.0 standard drinks    Frequency: Never  . Drug use: Not Currently    Types: Cocaine    Comment: Cocaine and heroin - 10 years ago  . Sexual activity: Yes    Birth control/protection: None

## 2018-10-31 ENCOUNTER — Emergency Department (HOSPITAL_COMMUNITY): Payer: Medicare Other

## 2018-10-31 ENCOUNTER — Other Ambulatory Visit: Payer: Self-pay

## 2018-10-31 ENCOUNTER — Observation Stay (HOSPITAL_COMMUNITY)
Admission: EM | Admit: 2018-10-31 | Discharge: 2018-11-01 | Disposition: A | Discharge status: Home / Self Care | Payer: Medicare Other | Attending: Family Medicine | Admitting: Family Medicine

## 2018-10-31 ENCOUNTER — Encounter (HOSPITAL_COMMUNITY): Payer: Self-pay

## 2018-10-31 DIAGNOSIS — L97529 Non-pressure chronic ulcer of other part of left foot with unspecified severity: Secondary | ICD-10-CM

## 2018-10-31 DIAGNOSIS — Z20828 Contact with and (suspected) exposure to other viral communicable diseases: Secondary | ICD-10-CM | POA: Diagnosis not present

## 2018-10-31 DIAGNOSIS — M1990 Unspecified osteoarthritis, unspecified site: Secondary | ICD-10-CM | POA: Insufficient documentation

## 2018-10-31 DIAGNOSIS — E1151 Type 2 diabetes mellitus with diabetic peripheral angiopathy without gangrene: Secondary | ICD-10-CM | POA: Insufficient documentation

## 2018-10-31 DIAGNOSIS — E11621 Type 2 diabetes mellitus with foot ulcer: Secondary | ICD-10-CM | POA: Diagnosis not present

## 2018-10-31 DIAGNOSIS — X58XXXA Exposure to other specified factors, initial encounter: Secondary | ICD-10-CM | POA: Diagnosis not present

## 2018-10-31 DIAGNOSIS — Z89421 Acquired absence of other right toe(s): Secondary | ICD-10-CM | POA: Insufficient documentation

## 2018-10-31 DIAGNOSIS — T50901A Poisoning by unspecified drugs, medicaments and biological substances, accidental (unintentional), initial encounter: Secondary | ICD-10-CM

## 2018-10-31 DIAGNOSIS — R918 Other nonspecific abnormal finding of lung field: Secondary | ICD-10-CM | POA: Diagnosis not present

## 2018-10-31 DIAGNOSIS — F17210 Nicotine dependence, cigarettes, uncomplicated: Secondary | ICD-10-CM | POA: Diagnosis not present

## 2018-10-31 DIAGNOSIS — G9340 Encephalopathy, unspecified: Secondary | ICD-10-CM | POA: Diagnosis present

## 2018-10-31 DIAGNOSIS — R29898 Other symptoms and signs involving the musculoskeletal system: Secondary | ICD-10-CM | POA: Diagnosis not present

## 2018-10-31 DIAGNOSIS — F411 Generalized anxiety disorder: Secondary | ICD-10-CM | POA: Diagnosis not present

## 2018-10-31 DIAGNOSIS — F1910 Other psychoactive substance abuse, uncomplicated: Secondary | ICD-10-CM | POA: Diagnosis present

## 2018-10-31 DIAGNOSIS — Z79899 Other long term (current) drug therapy: Secondary | ICD-10-CM | POA: Diagnosis not present

## 2018-10-31 DIAGNOSIS — Z8673 Personal history of transient ischemic attack (TIA), and cerebral infarction without residual deficits: Secondary | ICD-10-CM | POA: Insufficient documentation

## 2018-10-31 DIAGNOSIS — K219 Gastro-esophageal reflux disease without esophagitis: Secondary | ICD-10-CM | POA: Insufficient documentation

## 2018-10-31 DIAGNOSIS — Z7952 Long term (current) use of systemic steroids: Secondary | ICD-10-CM | POA: Diagnosis not present

## 2018-10-31 DIAGNOSIS — Z7984 Long term (current) use of oral hypoglycemic drugs: Secondary | ICD-10-CM | POA: Diagnosis not present

## 2018-10-31 DIAGNOSIS — R531 Weakness: Secondary | ICD-10-CM | POA: Diagnosis not present

## 2018-10-31 DIAGNOSIS — T40601A Poisoning by unspecified narcotics, accidental (unintentional), initial encounter: Secondary | ICD-10-CM | POA: Diagnosis not present

## 2018-10-31 LAB — COMPREHENSIVE METABOLIC PANEL
ALT: 15 U/L (ref 0–44)
AST: 21 U/L (ref 15–41)
Albumin: 3.9 g/dL (ref 3.5–5.0)
Alkaline Phosphatase: 82 U/L (ref 38–126)
Anion gap: 13 (ref 5–15)
BUN: 12 mg/dL (ref 6–20)
CO2: 21 mmol/L — ABNORMAL LOW (ref 22–32)
Calcium: 9.7 mg/dL (ref 8.9–10.3)
Chloride: 101 mmol/L (ref 98–111)
Creatinine, Ser: 1.2 mg/dL (ref 0.61–1.24)
GFR calc Af Amer: >60 mL/min (ref >60)
GFR calc non Af Amer: >60 mL/min (ref >60)
Glucose, Bld: 173 mg/dL — ABNORMAL HIGH (ref 70–99)
Potassium: 3.8 mmol/L (ref 3.5–5.1)
Sodium: 135 mmol/L (ref 135–145)
Total Bilirubin: 0.4 mg/dL (ref 0.3–1.2)
Total Protein: 7.1 g/dL (ref 6.5–8.1)

## 2018-10-31 LAB — CBC WITH DIFFERENTIAL/PLATELET
Abs Immature Granulocytes: 0.02 10*3/uL (ref 0.00–0.07)
Basophils Absolute: 0 10*3/uL (ref 0.0–0.1)
Basophils Relative: 0 %
Eosinophils Absolute: 0.1 10*3/uL (ref 0.0–0.5)
Eosinophils Relative: 2 %
HCT: 38.8 % — ABNORMAL LOW (ref 39.0–52.0)
Hemoglobin: 12.4 g/dL — ABNORMAL LOW (ref 13.0–17.0)
Immature Granulocytes: 0 %
Lymphocytes Relative: 13 %
Lymphs Abs: 0.9 10*3/uL (ref 0.7–4.0)
MCH: 26.7 pg (ref 26.0–34.0)
MCHC: 32 g/dL (ref 30.0–36.0)
MCV: 83.6 fL (ref 80.0–100.0)
Monocytes Absolute: 0.6 10*3/uL (ref 0.1–1.0)
Monocytes Relative: 9 %
Neutro Abs: 5.2 10*3/uL (ref 1.7–7.7)
Neutrophils Relative %: 76 %
Platelets: 204 10*3/uL (ref 150–400)
RBC: 4.64 MIL/uL (ref 4.22–5.81)
RDW: 14.6 % (ref 11.5–15.5)
WBC: 6.9 10*3/uL (ref 4.0–10.5)
nRBC: 0 % (ref 0.0–0.2)

## 2018-10-31 LAB — URINALYSIS, ROUTINE W REFLEX MICROSCOPIC
Bilirubin Urine: NEGATIVE
Glucose, UA: 50 mg/dL — AB
Ketones, ur: 20 mg/dL — AB
Leukocytes,Ua: NEGATIVE
Nitrite: NEGATIVE
Protein, ur: NEGATIVE mg/dL
Specific Gravity, Urine: 1.006 (ref 1.005–1.030)
pH: 5 (ref 5.0–8.0)

## 2018-10-31 LAB — RAPID URINE DRUG SCREEN, HOSP PERFORMED
Amphetamines: NOT DETECTED
Barbiturates: NOT DETECTED
Benzodiazepines: POSITIVE — AB
Cocaine: POSITIVE — AB
Opiates: POSITIVE — AB
Tetrahydrocannabinol: NOT DETECTED

## 2018-10-31 LAB — AMMONIA: Ammonia: 18 umol/L (ref 9–35)

## 2018-10-31 LAB — ETHANOL: Alcohol, Ethyl (B): <10 mg/dL (ref <10)

## 2018-10-31 MED ORDER — SODIUM CHLORIDE 0.9% FLUSH: 3.0000 mL | Freq: Two times a day (BID) | INTRAVENOUS | Status: DC

## 2018-10-31 MED ORDER — INSULIN ASPART 100 UNIT/ML ~~LOC~~ SOLN
0.0000 [IU] | SUBCUTANEOUS | Status: DC
  Administered 2018-11-01: 2 [IU] via SUBCUTANEOUS

## 2018-10-31 MED ORDER — ACETAMINOPHEN 325 MG PO TABS: 650.0000 mg | ORAL_TABLET | Freq: Four times a day (QID) | ORAL | Status: DC | PRN

## 2018-10-31 MED ORDER — ONDANSETRON HCL 4 MG/2ML IJ SOLN: 4.0000 mg | Freq: Four times a day (QID) | INTRAMUSCULAR | Status: DC | PRN

## 2018-10-31 MED ORDER — NALOXONE HCL 2 MG/2ML IJ SOSY
1.0000 mg | PREFILLED_SYRINGE | Freq: Once | INTRAMUSCULAR | Status: AC
  Administered 2018-10-31: 20:00:00 1 mg via INTRAVENOUS
  Filled 2018-10-31: qty 2

## 2018-10-31 MED ORDER — ACETAMINOPHEN 650 MG RE SUPP: 650.0000 mg | Freq: Four times a day (QID) | RECTAL | Status: DC | PRN

## 2018-10-31 MED ORDER — NALOXONE HCL 0.4 MG/ML IJ SOLN: 0.4000 mg | INTRAMUSCULAR | Status: DC | PRN

## 2018-10-31 MED ORDER — SODIUM CHLORIDE 0.9 % IV BOLUS
1000.0000 mL | Freq: Once | INTRAVENOUS | Status: AC
  Administered 2018-10-31: 1000 mL via INTRAVENOUS

## 2018-10-31 MED ORDER — ENOXAPARIN SODIUM 40 MG/0.4ML ~~LOC~~ SOLN: 40.0000 mg | SUBCUTANEOUS | Status: DC

## 2018-10-31 MED ORDER — ONDANSETRON HCL 4 MG PO TABS: 4.0000 mg | ORAL_TABLET | Freq: Four times a day (QID) | ORAL | Status: DC | PRN

## 2018-10-31 MED ORDER — SODIUM CHLORIDE 0.9 % IV SOLN
INTRAVENOUS | Status: AC
  Administered 2018-11-01: 01:00:00 via INTRAVENOUS

## 2018-10-31 MED ORDER — NALOXONE HCL 2 MG/2ML IJ SOSY
1.0000 mg | PREFILLED_SYRINGE | Freq: Once | INTRAMUSCULAR | Status: AC
  Administered 2018-10-31 (×2): 1 mg via INTRAVENOUS

## 2018-10-31 MED ORDER — NALOXONE HCL 2 MG/2ML IJ SOSY
PREFILLED_SYRINGE | INTRAMUSCULAR | Status: AC
  Filled 2018-10-31: qty 2

## 2018-10-31 MED ORDER — NALOXONE HCL 2 MG/2ML IJ SOSY
PREFILLED_SYRINGE | INTRAMUSCULAR | Status: AC
  Administered 2018-10-31: 1 mg via INTRAVENOUS
  Filled 2018-10-31: qty 2

## 2018-10-31 NOTE — ED Triage Notes (Signed)
[facility] called out by family for unresponsive. Narcan 0.5 mg give IV by EMS. Oxycodone 10 mg bottle filled 10/27/18 with #120, only 16 left in bottle.

## 2018-10-31 NOTE — H&P (Signed)
History and Physical    [name] [id number] DOB: 1965/09/29 DOA: 10/31/2018  PCP: Lucia Gaskins, MD   Patient coming from: Home   Chief Complaint: Unresponsive   HPI: [name] is a 53 y.o. male with medical history significant for type 2 diabetes mellitus, bilateral foot ulcers status post right transmetatarsal amputation, polysubstance abuse, now presenting to the emergency department after he was found unresponsive.  Patient was reportedly found by his mother to be unresponsive, EMS was called, reports that the patient woke up with Narcan, and he was brought into the ED for evaluation.  Oxycodone 10 mg prescription bottle was found, 120 tablets were filled on 10/27/2018, and only 16 remain.  Patient acknowledges taking more of his pain medication then prescribed, denies any intention to harm himself.  Denies any chest pain, fevers, or cough.  Denies recent illness.  Reports that his left arm has been weak, particularly the hand, for over a month now, and states that he had an MRI of his brain that was negative for stroke.  ED Course: Upon arrival to the ED, patient is found to be afebrile, saturating mid 90s on room air, mildly tachypneic, tachycardic in the 818H, and with systolic blood pressure 631.  EKG features sinus tachycardia with rate 118.  Noncontrast head CT is negative for acute intracranial abnormality.  Chest x-ray is notable for possible trace left pleural effusion and patchy opacity in the left mid to lower lung that could reflect pneumonia.  CBC and chemistry panel are largely unremarkable.  Ethanol undetectable, ammonia level normal, urinalysis notable for ketonuria and glucosuria, and UDS positive for benzodiazepines, opiates, and cocaine.  The patient was given 2 L normal saline in the ED, required 2 doses of Narcan in the ED.  Review of Systems:  All other systems reviewed and apart from HPI, are negative.  Past Medical History:  Diagnosis Date  . Anxiety   .  Arthritis    "everywhere" (04/21/2012)  . Cellulitis and abscess of foot 12/19/2013   LEFT FOOT  . Cellulitis of right foot   . Chronic pain   . DDD (degenerative disc disease)   . Depression   . Diabetes mellitus without complication (HCC)    borderline  . Diabetic foot ulcer ([place])   . ETOH abuse   . Full dentures   . GERD (gastroesophageal reflux disease)    tums  . H/O amputation of lesser toe, right ([place]) 07/29/2017  . History of blood transfusion    "related to left knee OR; probably right hip too" (04/21/2012)  . Mental disorder   . Neuromuscular disorder (HCC)    neuropathy  . Noncompliance   . Open wound    bottom of foot  . Osteomyelitis ([place])    right 4th toe  . Osteomyelitis of right foot ([place])   . Pneumonia ~ 2012  . Polysubstance abuse (HCC)    etoh, cocaine, heroin  . Stroke [facility]) 2008   "they said I might have had one during right hip replacement" (04/21/2012)  . Wears glasses     Past Surgical History:  Procedure Laterality Date  . AMPUTATION Right 06/09/2017   Procedure: RIGHT 2ND TOE AMPUTATION;  Surgeon: [name], MD;  Location: [place];  Service: Orthopedics;  Laterality: Right;  . AMPUTATION Right 07/23/2017   Procedure: RIGHT 4TH TOE AMPUTATION;  Surgeon: [name], MD;  Location: [place];  Service: Orthopedics;  Laterality: Right;  . AMPUTATION Right 06/15/2018   Procedure: RIGHT  FOOT TRANSMETATARSAL AMPUTATION;  Surgeon: [name], MD;  Location: [place];  Service: Orthopedics;  Laterality: Right;  right  . JOINT REPLACEMENT    . KNEE ARTHROSCOPY Bilateral 1980's/1990's  . LUNG LOBECTOMY Left ~ 2006  . LUNG LOBECTOMY    . METATARSAL OSTEOTOMY  10/29/2011   Procedure: METATARSAL OSTEOTOMY;  Surgeon: [name], MD;  Location: [place];  Service: Orthopedics;  Laterality: Left;  Left 1st Metatarsal Dorsal Closing Wedge   . METATARSAL OSTEOTOMY Right 04/28/2017   Procedure: RIGHT 1ST METATARSAL DORSAL CLOSING WEDGE OSTEOTOMY;  Surgeon: [name], MD;  Location: [place];  Service: Orthopedics;  Laterality: Right;  . MULTIPLE TOOTH EXTRACTIONS    . REVISION TOTAL HIP ARTHROPLASTY Right 2008   "4-5 months after replacement" (04/21/2012)  . TOTAL HIP ARTHROPLASTY Right 2008  . TOTAL KNEE ARTHROPLASTY Left 2006  . TOTAL KNEE ARTHROPLASTY Right 04/20/2012  . TOTAL KNEE ARTHROPLASTY Right 04/20/2012   Procedure: TOTAL KNEE ARTHROPLASTY;  Surgeon: [name], MD;  Location: [place];  Service: Orthopedics;  Laterality: Right;  Right Total Knee Arthroplasty     reports that he has been smoking cigarettes. He has a 30.00 pack-year smoking history. He has never used smokeless tobacco. He reports previous alcohol use. He reports previous drug use. Drug: Cocaine.  Allergies  Allergen Reactions  . Benadryl [Diphenhydramine Hcl] Other (See Comments)    Leg spasms   . Trazodone And Nefazodone Other (See Comments)    Leg spasms     Family History  Problem Relation Age of Onset  . Diabetes Father      Prior to Admission medications   Medication Sig Start Date End Date Taking? Authorizing Provider  Oxycodone HCl 10 MG TABS Take 10 mg by mouth 4 (four) times daily as needed (for pain).   Yes [provider]  blood glucose meter kit and supplies KIT Dispense based on patient and insurance preference. Use up to four times daily as directed. (FOR ICD-9 250.00, 250.01). 06/17/18   Kayleen Memos, DO  Cholecalciferol (VITAMIN D3) 5000 units CAPS Take 10,000 Units by mouth daily.    [provider]  metFORMIN (GLUCOPHAGE) 500 MG tablet Take 2 tablets (1,000 mg total) by mouth 2 (two) times daily with a meal. 06/17/18   Kayleen Memos, DO  methocarbamol (ROBAXIN) 500 MG tablet Take 1 tablet (500 mg total) by mouth every 8 (eight) hours as needed for muscle spasms. 08/30/18   Suzan Slick, NP  nicotine (NICODERM CQ - DOSED IN MG/24 HOURS) 21 mg/24hr patch Place 1 patch (21 mg total) onto the skin daily. 06/17/18   Kayleen Memos, DO   nitroGLYCERIN (NITRODUR - DOSED IN MG/24 HR) 0.2 mg/hr patch Place 1 patch (0.2 mg total) onto the skin daily. 07/14/18   [place], Neta Mends, PA-C  pentoxifylline (TRENTAL) 400 MG CR tablet Take 1 tablet (400 mg total) by mouth 3 (three) times daily with meals. 07/14/18   Rayburn, Neta Mends, PA-C  polyethylene glycol (MIRALAX / GLYCOLAX) 17 g packet Take 17 g by mouth daily as needed for mild constipation. 06/17/18   Kayleen Memos, DO  predniSONE (DELTASONE) 50 MG tablet Take one tablet by mouth once daily for 5 days. 10/04/18   Suzan Slick, NP  QUEtiapine (SEROQUEL) 100 MG tablet Take 100 mg by mouth at bedtime.    [provider]  rosuvastatin (CRESTOR) 20 MG tablet Take 1 tablet (20 mg total) by mouth  daily. 06/17/18   Kayleen Memos, DO  senna-docusate (SENOKOT-S) 8.6-50 MG tablet Take 2 tablets by mouth at bedtime. 06/17/18   Kayleen Memos, DO  silver sulfADIAZINE (SILVADENE) 1 % cream Apply 1 application topically daily. 10/13/18   [name], MD  sulfamethoxazole-trimethoprim (BACTRIM DS) 800-160 MG tablet Take 1 tablet by mouth 2 (two) times daily. 09/27/18   [name], MD  vitamin C (ASCORBIC ACID) 500 MG tablet Take 500 mg by mouth daily.    [provider]    Physical Exam: Vitals:   10/31/18 2130 10/31/18 2200 10/31/18 2230 10/31/18 2300  BP: 108/73 (!) 93/58 104/60 (!) 99/59  Pulse: (!) 107  (!) 112 (!) 103  Resp: (!) _0 Temp:      TempSrc:      SpO2: 95% 98% 98% 98%  Weight:      Height:        Constitutional: NAD, lethargic  Eyes: PERTLA, lids and conjunctivae normal ENMT: Mucous membranes are moist. Posterior pharynx clear of any exudate or lesions.   Neck: normal, supple, no masses, no thyromegaly Respiratory:  no wheezing, no crackles. No accessory muscle use.  Cardiovascular: S1 & S2 heard, regular rate and rhythm. No extremity edema.  Abdomen: No distension, no tenderness, soft. Bowel sounds active.  Musculoskeletal:  no clubbing / cyanosis. Status-post right TMA.    Skin: Foot ulcer with scant clear drainage. Warm, dry, well-perfused. Neurologic: No gross facial asymmetry. Sensation intact. Distal LUE weakness.  Psychiatric: Lethargic. Wakes and answers questions, oriented to person and place only.    Labs on Admission: I have personally reviewed following labs and imaging studies  CBC: Recent Labs  Lab 10/31/18 1745  WBC 6.9  NEUTROABS 5.2  HGB 12.4*  HCT 38.8*  MCV 83.6  PLT 469   Basic Metabolic Panel: Recent Labs  Lab 10/31/18 1745  NA 135  K 3.8  CL 101  CO2 21*  GLUCOSE 173*  BUN 12  CREATININE 1.20  CALCIUM 9.7   GFR: Estimated Creatinine Clearance: 82.5 mL/min (by C-G formula based on SCr of 1.2 mg/dL). Liver Function Tests: Recent Labs  Lab 10/31/18 1745  AST 21  ALT 15  ALKPHOS 82  BILITOT 0.4  PROT 7.1  ALBUMIN 3.9   No results for input(s): LIPASE, AMYLASE in the last 168 hours. Recent Labs  Lab 10/31/18 1953  AMMONIA 18   Coagulation Profile: No results for input(s): INR, PROTIME in the last 168 hours. Cardiac Enzymes: No results for input(s): CKTOTAL, CKMB, CKMBINDEX, TROPONINI in the last 168 hours. BNP (last 3 results) No results for input(s): PROBNP in the last 8760 hours. HbA1C: No results for input(s): HGBA1C in the last 72 hours. CBG: No results for input(s): GLUCAP in the last 168 hours. Lipid Profile: No results for input(s): CHOL, HDL, LDLCALC, TRIG, CHOLHDL, LDLDIRECT in the last 72 hours. Thyroid Function Tests: No results for input(s): TSH, T4TOTAL, FREET4, T3FREE, THYROIDAB in the last 72 hours. Anemia Panel: No results for input(s): VITAMINB12, FOLATE, FERRITIN, TIBC, IRON, RETICCTPCT in the last 72 hours. Urine analysis:    Component Value Date/Time   COLORURINE STRAW (A) 10/31/2018 1928   APPEARANCEUR CLEAR 10/31/2018 1928   LABSPEC 1.006 10/31/2018 1928   PHURINE 5.0 10/31/2018 1928   GLUCOSEU 50 (A) 10/31/2018 1928   HGBUR  SMALL (A) 10/31/2018 1928   BILIRUBINUR NEGATIVE 10/31/2018 1928   KETONESUR 20 (A) 10/31/2018 [place] NEGATIVE 10/31/2018 1928  UROBILINOGEN 0.2 12/18/2013 1402   NITRITE NEGATIVE 10/31/2018 1928   LEUKOCYTESUR NEGATIVE 10/31/2018 1928   Sepsis Labs: _0 (procalcitonin:4,lacticidven:4) )No results found for this or any previous visit (from the past 240 hour(s)).   Radiological Exams on Admission: Ct Head Wo Contrast  Result Date: 10/31/2018 CLINICAL DATA:  Altered level of consciousness. EXAM: CT HEAD WITHOUT CONTRAST TECHNIQUE: Contiguous axial images were obtained from the base of the skull through the vertex without intravenous contrast. COMPARISON:  Or 17 2018 FINDINGS: Brain: No evidence of acute infarction, hemorrhage, hydrocephalus, extra-axial collection or mass lesion/mass effect. Age advanced brain parenchymal volume loss and deep white matter microangiopathy. Vascular: No hyperdense vessel or unexpected calcification. Skull: Normal. Negative for fracture or focal lesion. Sinuses/Orbits: No acute finding. Other: None. IMPRESSION: 1. No acute intracranial abnormality. 2. Age advanced brain parenchymal atrophy and chronic microvascular disease. Electronically Signed   By: Fidela Salisbury M.D.   On: 10/31/2018 20:36   Dg Chest Portable 1 View  Result Date: 10/31/2018 CLINICAL DATA:  Weakness EXAM: PORTABLE CHEST 1 VIEW COMPARISON:  09/04/2017 FINDINGS: Right lung is grossly clear. Patchy opacities in the left mid lung and lung base with possible small left effusion. Stable cardiomediastinal silhouette. No pneumothorax. IMPRESSION: 1. Possible trace left pleural effusion 2. Patchy opacities in the left mid to lower lung, possible pneumonia Electronically Signed   By: Donavan Foil M.D.   On: 10/31/2018 20:49    EKG: Independently reviewed. Sinus tachycardia, rate 118, non-specific ST abnormality, similar to prior.   Assessment/Plan   1. Unintentional overdose;  polysubstance abuse  - Found unresponsive at home, improved with Narcan given by EMS, pill count suggests he has taken 104 tabs of oxycodone 10 mg in the past 4 days and UDS is also positive for benzodiazepines and cocaine  - The patient required 2 more doses of Narcan in ED, denies any intent to harm himself  - There is patchy opacity on CXR that could reflect PNA but no fever or leukocytosis, ?aspiration, will check procalcitonin and sputum culture  - Continue close monitoring and supportive care, consult with social work for possible resources   2. Type II DM  - A1c was 6.1% May 2020  - Managed with metformin at home, held on admission  - Check CBG's and use a low-intensity SSI with Novolog while in hospital   3. Left arm weakness  - Present for >1 month, reports recent negative MRI brain, following with ortho with plan for MRI c-spine   4. Foot ulcers  - Not acutely infected, continue wound care     PPE: Mask, face shield  DVT prophylaxis: Lovenox  Code Status: Full  Family Communication: Discussed with patient  Consults called: None Admission status: Observation     Vianne Bulls, MD [facility] Pager [phone]  If 7PM-7AM, please contact night-coverage [email or web address] Password [email or web address]  10/31/2018, 11:09 PM

## 2018-10-31 NOTE — ED Provider Notes (Signed)
[facility] EMERGENCY DEPARTMENT Provider Note   CSN: [id number] Arrival date & time: 10/31/18  1734     History   Chief Complaint Chief Complaint  Patient presents with  . Drug Overdose    HPI [name] is a 53 y.o. male.     Patient brought into the emergency department because he was unresponsive.  He was found by his mother.  Paramedics gave him Narcan and he awoke.  The history is provided by the patient, medical records and the EMS personnel.  Drug Overdose This is a new problem. The current episode started more than 2 days ago. The problem occurs constantly. The problem has not changed since onset.Pertinent negatives include no chest pain. Nothing aggravates the symptoms. Nothing relieves the symptoms. He has tried nothing for the symptoms. The treatment provided no relief.    Past Medical History:  Diagnosis Date  . Anxiety   . Arthritis    "everywhere" (04/21/2012)  . Cellulitis and abscess of foot 12/19/2013   LEFT FOOT  . Cellulitis of right foot   . Chronic pain   . DDD (degenerative disc disease)   . Depression   . Diabetes mellitus without complication (HCC)    borderline  . Diabetic foot ulcer ([place])   . ETOH abuse   . Full dentures   . GERD (gastroesophageal reflux disease)    tums  . H/O amputation of lesser toe, right ([place]) 07/29/2017  . History of blood transfusion    "related to left knee OR; probably right hip too" (04/21/2012)  . Mental disorder   . Neuromuscular disorder (HCC)    neuropathy  . Noncompliance   . Open wound    bottom of foot  . Osteomyelitis ([place])    right 4th toe  . Osteomyelitis of right foot ([place])   . Pneumonia ~ 2012  . Polysubstance abuse (HCC)    etoh, cocaine, heroin  . Stroke [facility]) 2008   "they said I might have had one during right hip replacement" (04/21/2012)  . Wears glasses     Patient Active Problem List   Diagnosis Date Noted  . Acute encephalopathy 10/31/2018  . Overdose, accidental or unintentional,  initial encounter 10/31/2018  . Left arm weakness 10/31/2018  . History of transmetatarsal amputation of right foot ([place]) 08/30/2018  . Charcot foot due to diabetes mellitus ([place])   . Cellulitis 06/12/2018  . Diabetic foot infection ([place]) 06/22/2017  . Post op infection 06/22/2017  . SVT (supraventricular tachycardia) ([place]) 08/17/2016  . Tobacco use disorder 10/25/2014  . Diabetes mellitus type 2 in nonobese (HCC)   . Swelling   . Type 2 diabetes mellitus with left diabetic foot ulcer ([place]) 12/18/2013  . Diabetes mellitus due to underlying condition with foot ulcer ([place]) 12/18/2013  . Left leg cellulitis 12/18/2013  . Cocaine abuse ([place]) 03/29/2012  . Generalized anxiety disorder 03/29/2012  . Panic attacks 03/29/2012  . Alcohol dependence ([place]) 08/05/2011    Class: Chronic  . Substance abuse ([place]) 05/31/2011  . Ulcer of left foot, limited to breakdown of skin ([place]) 02/15/2011    Past Surgical History:  Procedure Laterality Date  . AMPUTATION Right 06/09/2017   Procedure: RIGHT 2ND TOE AMPUTATION;  Surgeon: [name], MD;  Location: [place];  Service: Orthopedics;  Laterality: Right;  . AMPUTATION Right 07/23/2017   Procedure: RIGHT 4TH TOE AMPUTATION;  Surgeon: [name], MD;  Location: [place];  Service: Orthopedics;  Laterality: Right;  . AMPUTATION Right 06/15/2018  Procedure: RIGHT FOOT TRANSMETATARSAL AMPUTATION;  Surgeon: [name], MD;  Location: [place];  Service: Orthopedics;  Laterality: Right;  right  . JOINT REPLACEMENT    . KNEE ARTHROSCOPY Bilateral 1980's/1990's  . LUNG LOBECTOMY Left ~ 2006  . LUNG LOBECTOMY    . METATARSAL OSTEOTOMY  10/29/2011   Procedure: METATARSAL OSTEOTOMY;  Surgeon: [name], MD;  Location: [place];  Service: Orthopedics;  Laterality: Left;  Left 1st Metatarsal Dorsal Closing Wedge   . METATARSAL OSTEOTOMY Right 04/28/2017   Procedure: RIGHT 1ST METATARSAL DORSAL CLOSING WEDGE OSTEOTOMY;  Surgeon: [name], MD;  Location: [place];  Service: Orthopedics;  Laterality: Right;  . MULTIPLE TOOTH EXTRACTIONS    . REVISION TOTAL HIP ARTHROPLASTY Right 2008   "4-5 months after replacement" (04/21/2012)  . TOTAL HIP ARTHROPLASTY Right 2008  . TOTAL KNEE ARTHROPLASTY Left 2006  . TOTAL KNEE ARTHROPLASTY Right 04/20/2012  . TOTAL KNEE ARTHROPLASTY Right 04/20/2012   Procedure: TOTAL KNEE ARTHROPLASTY;  Surgeon: [name], MD;  Location: [place];  Service: Orthopedics;  Laterality: Right;  Right Total Knee Arthroplasty        Home Medications    Prior to Admission medications   Medication Sig Start Date End Date Taking? Authorizing Provider  Oxycodone HCl 10 MG TABS Take 10 mg by mouth 4 (four) times daily as needed (for pain).   Yes [provider]  blood glucose meter kit and supplies KIT Dispense based on patient and insurance preference. Use up to four times daily as directed. (FOR ICD-9 250.00, 250.01). 06/17/18   Kayleen Memos, DO  Cholecalciferol (VITAMIN D3) 5000 units CAPS Take 10,000 Units by mouth daily.    [provider]  metFORMIN (GLUCOPHAGE) 500 MG tablet Take 2 tablets (1,000 mg total) by mouth 2 (two) times daily with a meal. 06/17/18   Kayleen Memos, DO  methocarbamol (ROBAXIN) 500 MG tablet Take 1 tablet (500 mg total) by mouth every 8 (eight) hours as needed for muscle spasms. 08/30/18   Suzan Slick, NP  nicotine (NICODERM CQ - DOSED IN MG/24 HOURS) 21 mg/24hr patch Place 1 patch (21 mg total) onto the skin daily. 06/17/18   Kayleen Memos, DO  nitroGLYCERIN (NITRODUR - DOSED IN MG/24 HR) 0.2 mg/hr patch Place 1 patch (0.2 mg total) onto the skin daily. 07/14/18   Rayburn, Neta Mends, PA-C  pentoxifylline (TRENTAL) 400 MG CR tablet Take 1 tablet (400 mg total) by mouth 3 (three) times daily with meals. 07/14/18   Rayburn, Neta Mends, PA-C  polyethylene glycol (MIRALAX / GLYCOLAX) 17 g packet Take 17 g by mouth daily as needed for mild constipation. 06/17/18   Kayleen Memos, DO   predniSONE (DELTASONE) 50 MG tablet Take one tablet by mouth once daily for 5 days. 10/04/18   Suzan Slick, NP  QUEtiapine (SEROQUEL) 100 MG tablet Take 100 mg by mouth at bedtime.    [provider]  rosuvastatin (CRESTOR) 20 MG tablet Take 1 tablet (20 mg total) by mouth daily. 06/17/18   Kayleen Memos, DO  senna-docusate (SENOKOT-S) 8.6-50 MG tablet Take 2 tablets by mouth at bedtime. 06/17/18   Kayleen Memos, DO  silver sulfADIAZINE (SILVADENE) 1 % cream Apply 1 application topically daily. 10/13/18   [name], MD  sulfamethoxazole-trimethoprim (BACTRIM DS) 800-160 MG tablet Take 1 tablet by mouth 2 (two) times daily. 09/27/18   [name], MD  vitamin C (ASCORBIC ACID)  500 MG tablet Take 500 mg by mouth daily.    [provider]    Family History Family History  Problem Relation Age of Onset  . Diabetes Father     Social History Social History   Tobacco Use  . Smoking status: Current Every Day Smoker    Packs/day: 1.00    Years: 30.00    Pack years: 30.00    Types: Cigarettes  . Smokeless tobacco: Never Used  Substance Use Topics  . Alcohol use: Not Currently    Alcohol/week: 0.0 standard drinks    Frequency: Never  . Drug use: Not Currently    Types: Cocaine    Comment: Cocaine and heroin - 10 years ago     Allergies   Benadryl [diphenhydramine hcl] and Trazodone and nefazodone   Review of Systems Review of Systems  Unable to perform ROS: Mental status change  Cardiovascular: Negative for chest pain.     Physical Exam Updated Vital Signs BP 104/60   Pulse (!) 112   Temp 98.7 F (37.1 C) (Oral)   Resp 18   Ht '6\' 3"'$  (1.905 m)   Wt 83.9 kg   SpO2 98%   BMI 23.12 kg/m   Physical Exam Vitals signs and nursing note reviewed.  Constitutional:      Appearance: He is well-developed.     Comments: Lethargic  HENT:     Head: Normocephalic.     Comments: Pinpoint pupils Eyes:     General: No scleral icterus.     Conjunctiva/sclera: Conjunctivae normal.  Neck:     Musculoskeletal: Neck supple.     Thyroid: No thyromegaly.  Cardiovascular:     Rate and Rhythm: Normal rate and regular rhythm.     Heart sounds: No murmur. No friction rub. No gallop.   Pulmonary:     Breath sounds: No stridor. No wheezing or rales.  Chest:     Chest wall: No tenderness.  Abdominal:     General: There is no distension.     Tenderness: There is no abdominal tenderness. There is no rebound.  Musculoskeletal: Normal range of motion.  Lymphadenopathy:     Cervical: No cervical adenopathy.  Skin:    Findings: No erythema or rash.  Neurological:     Motor: No abnormal muscle tone.     Coordination: Coordination normal.     Comments: Oriented to person only  Psychiatric:     Comments: Lethargic      ED Treatments / Results  Labs (all labs ordered are listed, but only abnormal results are displayed) Labs Reviewed  CBC WITH DIFFERENTIAL/PLATELET - Abnormal; Notable for the following components:      Result Value   Hemoglobin 12.4 (*)    HCT 38.8 (*)    All other components within normal limits  COMPREHENSIVE METABOLIC PANEL - Abnormal; Notable for the following components:   CO2 21 (*)    Glucose, Bld 173 (*)    All other components within normal limits  RAPID URINE DRUG SCREEN, HOSP PERFORMED - Abnormal; Notable for the following components:   Opiates POSITIVE (*)    Cocaine POSITIVE (*)    Benzodiazepines POSITIVE (*)    All other components within normal limits  URINALYSIS, ROUTINE W REFLEX MICROSCOPIC - Abnormal; Notable for the following components:   Color, Urine STRAW (*)    Glucose, UA 50 (*)    Hgb urine dipstick SMALL (*)    Ketones, ur 20 (*)    Bacteria, UA  RARE (*)    All other components within normal limits  ETHANOL  AMMONIA    EKG EKG Interpretation  Date/Time:  Monday October 31 2018 19:59:33 EDT Ventricular Rate:  109 PR Interval:    QRS Duration: 97 QT Interval:  329 QTC  Calculation: 443 R Axis:   86 Text Interpretation:  Sinus tachycardia Low voltage, extremity leads Borderline ST elevation, anterolateral leads Confirmed by Milton Ferguson [id number]) on 10/31/2018 10:28:34 PM   Radiology Ct Head Wo Contrast  Result Date: 10/31/2018 CLINICAL DATA:  Altered level of consciousness. EXAM: CT HEAD WITHOUT CONTRAST TECHNIQUE: Contiguous axial images were obtained from the base of the skull through the vertex without intravenous contrast. COMPARISON:  Or 17 2018 FINDINGS: Brain: No evidence of acute infarction, hemorrhage, hydrocephalus, extra-axial collection or mass lesion/mass effect. Age advanced brain parenchymal volume loss and deep white matter microangiopathy. Vascular: No hyperdense vessel or unexpected calcification. Skull: Normal. Negative for fracture or focal lesion. Sinuses/Orbits: No acute finding. Other: None. IMPRESSION: 1. No acute intracranial abnormality. 2. Age advanced brain parenchymal atrophy and chronic microvascular disease. Electronically Signed   By: Fidela Salisbury M.D.   On: 10/31/2018 20:36   Dg Chest Portable 1 View  Result Date: 10/31/2018 CLINICAL DATA:  Weakness EXAM: PORTABLE CHEST 1 VIEW COMPARISON:  09/04/2017 FINDINGS: Right lung is grossly clear. Patchy opacities in the left mid lung and lung base with possible small left effusion. Stable cardiomediastinal silhouette. No pneumothorax. IMPRESSION: 1. Possible trace left pleural effusion 2. Patchy opacities in the left mid to lower lung, possible pneumonia Electronically Signed   By: Donavan Foil M.D.   On: 10/31/2018 20:49    Procedures Procedures (including critical care time)  Medications Ordered in ED Medications  naloxone [facility]) injection 1 mg (1 mg Intravenous Given 10/31/18 2029)  sodium chloride 0.9 % bolus 1,000 mL (0 mLs Intravenous Stopped 10/31/18 1845)  naloxone (NARCAN) injection 1 mg (1 mg Intravenous Given 10/31/18 1954)  sodium chloride 0.9 % bolus 1,000 mL  (1,000 mLs Intravenous New Bag/Given 10/31/18 2237)     Initial Impression / Assessment and Plan / ED Course  I have reviewed the triage vital signs and the nursing notes.  Pertinent labs & imaging results that were available during my care of the patient were reviewed by me and considered in my medical decision making (see chart for details).        CRITICAL CARE Performed by: Milton Ferguson Total critical care time: 45 minutes Critical care time was exclusive of separately billable procedures and treating other patients. Critical care was necessary to treat or prevent imminent or life-threatening deterioration. Critical care was time spent personally by me on the following activities: development of treatment plan with patient and/or surrogate as well as nursing, discussions with consultants, evaluation of patient's response to treatment, examination of patient, obtaining history from patient or surrogate, ordering and performing treatments and interventions, ordering and review of laboratory studies, ordering and review of radiographic studies, pulse oximetry and re-evaluation of patient's condition. Patient was given Narcan 3 times and was still lethargic.  He has benzos in his urine test.  Patient will be admitted for narcotic overdose  Final Clinical Impressions(s) / ED Diagnoses   Final diagnoses:  Accidental drug overdose, initial encounter    ED Discharge Orders    None       Milton Ferguson, MD 10/31/18 2307

## 2018-11-01 ENCOUNTER — Encounter (HOSPITAL_COMMUNITY): Payer: Self-pay

## 2018-11-01 ENCOUNTER — Telehealth (HOSPITAL_COMMUNITY): Payer: Self-pay | Admitting: Occupational Therapy

## 2018-11-01 ENCOUNTER — Ambulatory Visit: Payer: Medicare Other | Admitting: Orthopedic Surgery

## 2018-11-01 DIAGNOSIS — T50901A Poisoning by unspecified drugs, medicaments and biological substances, accidental (unintentional), initial encounter: Secondary | ICD-10-CM

## 2018-11-01 LAB — CBC
HCT: 34.3 % — ABNORMAL LOW (ref 39.0–52.0)
Hemoglobin: 11 g/dL — ABNORMAL LOW (ref 13.0–17.0)
MCH: 27 pg (ref 26.0–34.0)
MCHC: 32.1 g/dL (ref 30.0–36.0)
MCV: 84.3 fL (ref 80.0–100.0)
Platelets: 176 10*3/uL (ref 150–400)
RBC: 4.07 MIL/uL — ABNORMAL LOW (ref 4.22–5.81)
RDW: 14.6 % (ref 11.5–15.5)
WBC: 5.2 10*3/uL (ref 4.0–10.5)
nRBC: 0 % (ref 0.0–0.2)

## 2018-11-01 LAB — PROCALCITONIN: Procalcitonin: <0.1 ng/mL

## 2018-11-01 LAB — MAGNESIUM: Magnesium: 1.8 mg/dL (ref 1.7–2.4)

## 2018-11-01 LAB — HEMOGLOBIN A1C
Hgb A1c MFr Bld: 6.4 % — ABNORMAL HIGH (ref 4.8–5.6)
Mean Plasma Glucose: 136.98 mg/dL

## 2018-11-01 LAB — BASIC METABOLIC PANEL
Anion gap: 9 (ref 5–15)
BUN: 10 mg/dL (ref 6–20)
CO2: 23 mmol/L (ref 22–32)
Calcium: 8.4 mg/dL — ABNORMAL LOW (ref 8.9–10.3)
Chloride: 109 mmol/L (ref 98–111)
Creatinine, Ser: 0.86 mg/dL (ref 0.61–1.24)
GFR calc Af Amer: >60 mL/min (ref >60)
GFR calc non Af Amer: >60 mL/min (ref >60)
Glucose, Bld: 87 mg/dL (ref 70–99)
Potassium: 3.6 mmol/L (ref 3.5–5.1)
Sodium: 141 mmol/L (ref 135–145)

## 2018-11-01 LAB — GLUCOSE, CAPILLARY
Glucose-Capillary: 147 mg/dL — ABNORMAL HIGH (ref 70–99)
Glucose-Capillary: 92 mg/dL (ref 70–99)
Glucose-Capillary: 92 mg/dL (ref 70–99)
Glucose-Capillary: 166 mg/dL — ABNORMAL HIGH (ref 70–99)

## 2018-11-01 LAB — SARS CORONAVIRUS 2 (TAT 6-24 HRS): SARS Coronavirus 2: NEGATIVE

## 2018-11-01 LAB — MRSA PCR SCREENING: MRSA by PCR: POSITIVE — AB

## 2018-11-01 MED ORDER — CHLORHEXIDINE GLUCONATE CLOTH 2 % EX PADS
6.0000 | MEDICATED_PAD | Freq: Every day | CUTANEOUS | Status: DC
  Administered 2018-11-01: 6 via TOPICAL

## 2018-11-01 NOTE — Discharge Instructions (Signed)
1) you have declined outpatient rehab for cocaine and narcotic abuse 2) abstinence from cocaine, illicit drugs strongly advised--- please use the information/resources given to you to get some help 3) take your narcotic prescriptions only as prescribed ,please avoid taking additional doses 4) please follow-up with your primary care physician for recheck and reevaluation within a week

## 2018-11-01 NOTE — Progress Notes (Signed)
Oxycodone found in patients clothes. Day RN Purcell Nails made aware- no pharmacy bags in medication room. Medication placed in pts drawer with Dodi, NT and Purcell Nails, RN as witness.

## 2018-11-01 NOTE — Discharge Summary (Signed)
[name], is a 53 y.o. male  DOB 1965/02/24  MRN [id number].  Admission date:  10/31/2018  Admitting Physician  Vianne Bulls, MD  Discharge Date:  11/01/2018   Primary MD  Lucia Gaskins, MD  Recommendations for primary care physician for things to follow:  - 1) you have declined outpatient rehab for cocaine and narcotic abuse 2) abstinence from cocaine, illicit drugs strongly advised--- please use the information/resources given to you to get some help 3) take your narcotic prescriptions only as prescribed ,please avoid taking additional doses 4) please follow-up with your primary care physician for recheck and reevaluation within a week  Admission Diagnosis  Accidental drug overdose, initial encounter [T50.901A]   Discharge Diagnosis  Accidental drug overdose, initial encounter [T50.901A]    Principal Problem:   Acute encephalopathy Active Problems:   Substance abuse ([place])   Type 2 diabetes mellitus with left diabetic foot ulcer ([place])   Accidental drug overdose   Left arm weakness      Past Medical History:  Diagnosis Date  . Anxiety   . Arthritis    "everywhere" (04/21/2012)  . Cellulitis and abscess of foot 12/19/2013   LEFT FOOT  . Cellulitis of right foot   . Chronic pain   . DDD (degenerative disc disease)   . Depression   . Diabetes mellitus without complication (HCC)    borderline  . Diabetic foot ulcer ([place])   . ETOH abuse   . Full dentures   . GERD (gastroesophageal reflux disease)    tums  . H/O amputation of lesser toe, right ([place]) 07/29/2017  . History of blood transfusion    "related to left knee OR; probably right hip too" (04/21/2012)  . Mental disorder   . Neuromuscular disorder (HCC)    neuropathy  . Noncompliance   . Open wound    bottom of foot  . Osteomyelitis ([place])    right 4th toe  . Osteomyelitis of right foot ([place])   . Pneumonia ~ 2012  .  Polysubstance abuse (HCC)    etoh, cocaine, heroin  . Stroke [facility]) 2008   "they said I might have had one during right hip replacement" (04/21/2012)  . Wears glasses     Past Surgical History:  Procedure Laterality Date  . AMPUTATION Right 06/09/2017   Procedure: RIGHT 2ND TOE AMPUTATION;  Surgeon: [name], MD;  Location: [place];  Service: Orthopedics;  Laterality: Right;  . AMPUTATION Right 07/23/2017   Procedure: RIGHT 4TH TOE AMPUTATION;  Surgeon: [name], MD;  Location: [place];  Service: Orthopedics;  Laterality: Right;  . AMPUTATION Right 06/15/2018   Procedure: RIGHT FOOT TRANSMETATARSAL AMPUTATION;  Surgeon: [name], MD;  Location: [place];  Service: Orthopedics;  Laterality: Right;  right  . JOINT REPLACEMENT    . KNEE ARTHROSCOPY Bilateral 1980's/1990's  . LUNG LOBECTOMY Left ~ 2006  . LUNG LOBECTOMY    . METATARSAL OSTEOTOMY  10/29/2011   Procedure: METATARSAL OSTEOTOMY;  Surgeon: [name], MD;  Location: [place];  Service: Orthopedics;  Laterality: Left;  Left 1st Metatarsal Dorsal Closing Wedge   . METATARSAL OSTEOTOMY Right 04/28/2017   Procedure: RIGHT 1ST METATARSAL DORSAL CLOSING WEDGE OSTEOTOMY;  Surgeon: [name], MD;  Location: [place];  Service: Orthopedics;  Laterality: Right;  . MULTIPLE TOOTH EXTRACTIONS    . REVISION TOTAL HIP ARTHROPLASTY Right 2008   "4-5 months after replacement" (04/21/2012)  . TOTAL HIP ARTHROPLASTY Right 2008  . TOTAL KNEE ARTHROPLASTY Left 2006  . TOTAL KNEE ARTHROPLASTY Right 04/20/2012  . TOTAL KNEE ARTHROPLASTY Right 04/20/2012   Procedure: TOTAL KNEE ARTHROPLASTY;  Surgeon: [name], MD;  Location: [place];  Service: Orthopedics;  Laterality: Right;  Right Total Knee Arthroplasty       HPI  from the history and physical done on the day of admission:    - Chief Complaint: Unresponsive   HPI: [name] is a 53 y.o. male with medical history significant for type 2 diabetes mellitus, bilateral foot ulcers status  post right transmetatarsal amputation, polysubstance abuse, now presenting to the emergency department after he was found unresponsive.  Patient was reportedly found by his mother to be unresponsive, EMS was called, reports that the patient woke up with Narcan, and he was brought into the ED for evaluation.  Oxycodone 10 mg prescription bottle was found, 120 tablets were filled on 10/27/2018, and only 16 remain.  Patient acknowledges taking more of his pain medication then prescribed, denies any intention to harm himself.  Denies any chest pain, fevers, or cough.  Denies recent illness.  Reports that his left arm has been weak, particularly the hand, for over a month now, and states that he had an MRI of his brain that was negative for stroke.  ED Course: Upon arrival to the ED, patient is found to be afebrile, saturating mid 90s on room air, mildly tachypneic, tachycardic in the 253G, and with systolic blood pressure 644.  EKG features sinus tachycardia with rate 118.  Noncontrast head CT is negative for acute intracranial abnormality.  Chest x-ray is notable for possible trace left pleural effusion and patchy opacity in the left mid to lower lung that could reflect pneumonia.  CBC and chemistry panel are largely unremarkable.  Ethanol undetectable, ammonia level normal, urinalysis notable for ketonuria and glucosuria, and UDS positive for benzodiazepines, opiates, and cocaine.  The patient was given 2 L normal saline in the ED, required 2 doses of Narcan in the ED.     Hospital Course:      - 1. Unintentional overdose; polysubstance abuse  - Found unresponsive at home, improved with Narcan given by EMS, pill count suggests he has taken 104 tabs of oxycodone 10 mg in the past 4 days and UDS is also positive for benzodiazepines and cocaine  - The patient required 2 more doses of Narcan in ED, denies any intent to harm himself   no fever or leukocytosis, procalcitonin is less than 0.10 -Patient  ambulated around the unit without dyspnea on exertion or hypoxia -Clinically doubt significant pneumonia -No clinical indications for antibiotics at this time -Patient denies intentional overdose -Denies suicidal or homicidal ideation or plan -Declines referral to drug and substance abuse programs -He wants to go home   2. Type II DM  - A1c was 6.1% May 2020  -Resume home regimen  3. Left arm weakness  - Present for >1 month, reports recent negative MRI brain, following with ortho with plan for MRI c-spine  -  Patient seen Dr. Sharol Given, EMG planned  4. Foot ulcers  - Not acutely infected, continue wound care    Discharge Condition: Stable,  Follow UP--- Dr. Sharol Given Diet and Activity recommendation:  As advised  Discharge Instructions    Discharge Instructions    Call MD for:  difficulty breathing, headache or visual disturbances   Complete by: As directed    Call MD for:  extreme fatigue   Complete by: As directed    Call MD for:  persistant dizziness or light-headedness   Complete by: As directed    Call MD for:  persistant nausea and vomiting   Complete by: As directed    Call MD for:  severe uncontrolled pain   Complete by: As directed    Call MD for:  temperature >100.4   Complete by: As directed    Diet - low sodium heart healthy   Complete by: As directed    Discharge instructions   Complete by: As directed    1) you have declined outpatient rehab for cocaine and narcotic abuse 2) abstinence from cocaine, illicit drugs strongly advised--- please use the information/resources given to you to get some help 3) take your narcotic prescriptions only as prescribed ,please avoid taking additional doses 4) please follow-up with your primary care physician for recheck and reevaluation within a week   Increase activity slowly   Complete by: As directed         Discharge Medications     Allergies as of 11/01/2018      Reactions   Benadryl [diphenhydramine Hcl] Other  (See Comments)   Leg spasms    Trazodone And Nefazodone Other (See Comments)   Leg spasms       Medication List    STOP taking these medications   blood glucose meter kit and supplies Kit   ibuprofen 200 MG tablet Commonly known as: ADVIL   methocarbamol 500 MG tablet Commonly known as: ROBAXIN   predniSONE 50 MG tablet Commonly known as: DELTASONE   silver sulfADIAZINE 1 % cream Commonly known as: Silvadene   sulfamethoxazole-trimethoprim 800-160 MG tablet Commonly known as: Bactrim DS     TAKE these medications   metFORMIN 500 MG tablet Commonly known as: Glucophage Take 2 tablets (1,000 mg total) by mouth 2 (two) times daily with a meal.   nicotine 21 mg/24hr patch Commonly known as: NICODERM CQ - dosed in mg/24 hours Place 1 patch (21 mg total) onto the skin daily.   nitroGLYCERIN 0.2 mg/hr patch Commonly known as: NITRODUR - Dosed in mg/24 hr Place 1 patch (0.2 mg total) onto the skin daily.   Oxycodone HCl 10 MG Tabs Take 10 mg by mouth 4 (four) times daily as needed (for pain).   pentoxifylline 400 MG CR tablet Commonly known as: TRENTAL Take 1 tablet (400 mg total) by mouth 3 (three) times daily with meals.   polyethylene glycol 17 g packet Commonly known as: MIRALAX / GLYCOLAX Take 17 g by mouth daily as needed for mild constipation.   rosuvastatin 20 MG tablet Commonly known as: CRESTOR Take 1 tablet (20 mg total) by mouth daily.   senna-docusate 8.6-50 MG tablet Commonly known as: Senokot-S Take 2 tablets by mouth at bedtime.       Major procedures and Radiology Reports - PLEASE review detailed and final reports for all details, in brief -   Ct Head Wo Contrast  Result Date: 10/31/2018 CLINICAL DATA:  Altered level of consciousness. EXAM: CT HEAD WITHOUT  CONTRAST TECHNIQUE: Contiguous axial images were obtained from the base of the skull through the vertex without intravenous contrast. COMPARISON:  Or 17 2018 FINDINGS: Brain: No evidence  of acute infarction, hemorrhage, hydrocephalus, extra-axial collection or mass lesion/mass effect. Age advanced brain parenchymal volume loss and deep white matter microangiopathy. Vascular: No hyperdense vessel or unexpected calcification. Skull: Normal. Negative for fracture or focal lesion. Sinuses/Orbits: No acute finding. Other: None. IMPRESSION: 1. No acute intracranial abnormality. 2. Age advanced brain parenchymal atrophy and chronic microvascular disease. Electronically Signed   By: Fidela Salisbury M.D.   On: 10/31/2018 20:36   Dg Chest Portable 1 View  Result Date: 10/31/2018 CLINICAL DATA:  Weakness EXAM: PORTABLE CHEST 1 VIEW COMPARISON:  09/04/2017 FINDINGS: Right lung is grossly clear. Patchy opacities in the left mid lung and lung base with possible small left effusion. Stable cardiomediastinal silhouette. No pneumothorax. IMPRESSION: 1. Possible trace left pleural effusion 2. Patchy opacities in the left mid to lower lung, possible pneumonia Electronically Signed   By: Donavan Foil M.D.   On: 10/31/2018 20:49    Micro Results    Recent Results (from the past 240 hour(s))  MRSA PCR Screening     Status: Abnormal   Collection Time: 11/01/18 12:39 AM   Specimen: Nasal Mucosa; Nasopharyngeal  Result Value Ref Range Status   MRSA by PCR POSITIVE (A) NEGATIVE Final    Comment:        The GeneXpert MRSA Assay (FDA approved for NASAL specimens only), is one component of a comprehensive MRSA colonization surveillance program. It is not intended to diagnose MRSA infection nor to guide or monitor treatment for MRSA infections. RESULT CALLED TO, READ BACK BY AND VERIFIED WITH: HYLTON L. AT 0738A ON 101320 BY THOMPSON S. Performed at [facility], [address], [place], [place] [address]        Today   Subjective    Billy Turvey today has no new complaints  -Eating and drinking well, requesting discharge home  -Ambulated around the unit without hypoxia or dyspnea  on exertion          Patient has been seen and examined prior to discharge   Objective   Blood pressure 133/76, pulse 81, temperature 98 F (36.7 C), temperature source Oral, resp. rate 17, height _0  (1.905 m), weight 77.4 kg, SpO2 100 %.   Intake/Output Summary (Last 24 hours) at 11/01/2018 1208 Last data filed at 11/01/2018 0522 Gross per 24 hour  Intake 2198.28 ml  Output 1250 ml  Net 948.28 ml    Exam Gen:- Awake Alert, no acute distress HEENT:- [place].AT, No sclera icterus Neck-Supple Neck,No JVD,.  Lungs-  CTAB , good air movement bilaterally \ CV- S1, S2 normal, regular Abd-  +ve B.Sounds, Abd Soft, No tenderness,    Extremity/Skin:- No  edema,   good pulses Psych-affect is appropriate, oriented x3 Neuro-no new focal deficits, no tremors  MSK-left upper extremity in a splint, limited range of motion of the hand and wrist   Data Review   CBC w Diff:  Lab Results  Component Value Date   WBC 5.2 11/01/2018   HGB 11.0 (L) 11/01/2018   HCT 34.3 (L) 11/01/2018   PLT 176 11/01/2018   LYMPHOPCT 13 10/31/2018   MONOPCT 9 10/31/2018   EOSPCT 2 10/31/2018   BASOPCT 0 10/31/2018    CMP:  Lab Results  Component Value Date   NA 141 11/01/2018   K 3.6 11/01/2018   CL 109 11/01/2018  CO2 23 11/01/2018   BUN 10 11/01/2018   CREATININE 0.86 11/01/2018   PROT 7.1 10/31/2018   ALBUMIN 3.9 10/31/2018   BILITOT 0.4 10/31/2018   ALKPHOS 82 10/31/2018   AST 21 10/31/2018   ALT 15 10/31/2018  .   Total Discharge time is about 33 minutes  Roxan Hockey M.D on 11/01/2018 at 12:08 PM  Go to [email or web address] -  for contact info  [facility] - Office  [phone]

## 2018-11-01 NOTE — Progress Notes (Signed)
Patient walked approximately 150 feet. Oxygen saturation remained 100%, heart rate 99, tolerated well

## 2018-11-01 NOTE — Telephone Encounter (Signed)
pt called to cancel the appt due to he states he is having trouble driving due to he had his toes amputated. Pt state he wants home health therapy. Will see about that and will call us back to reschedule.

## 2018-11-01 NOTE — ED Notes (Signed)
[phone] Patient's mother/contact.

## 2018-11-02 ENCOUNTER — Telehealth: Payer: Self-pay | Admitting: Orthopedic Surgery

## 2018-11-02 NOTE — Telephone Encounter (Signed)
Last 2 ov notes faxed to [facility] [phone]

## 2018-11-03 ENCOUNTER — Ambulatory Visit (INDEPENDENT_AMBULATORY_CARE_PROVIDER_SITE_OTHER): Payer: Medicare Other | Admitting: Orthopedic Surgery

## 2018-11-03 ENCOUNTER — Encounter: Payer: Self-pay | Admitting: Orthopedic Surgery

## 2018-11-03 ENCOUNTER — Ambulatory Visit (HOSPITAL_COMMUNITY): Payer: Medicare Other | Admitting: Occupational Therapy

## 2018-11-03 ENCOUNTER — Other Ambulatory Visit: Payer: Self-pay

## 2018-11-03 DIAGNOSIS — Z89431 Acquired absence of right foot: Secondary | ICD-10-CM | POA: Diagnosis not present

## 2018-11-03 DIAGNOSIS — L97521 Non-pressure chronic ulcer of other part of left foot limited to breakdown of skin: Secondary | ICD-10-CM | POA: Diagnosis not present

## 2018-11-03 DIAGNOSIS — R29898 Other symptoms and signs involving the musculoskeletal system: Secondary | ICD-10-CM | POA: Diagnosis not present

## 2018-11-03 MED ORDER — SILVER SULFADIAZINE 1 % EX CREA: 1.0000 "application " | TOPICAL_CREAM | Freq: Every day | CUTANEOUS | 3 refills | Status: DC

## 2018-11-03 NOTE — Progress Notes (Signed)
Office Visit Note   Patient: [name]           Date of Birth: [birth year]           MRN: [id number] Visit Date: 11/03/2018              Requested by: Lucia Gaskins, MD [address] [place],  [place] [address] PCP: Lucia Gaskins, MD  Chief Complaint  Patient presents with  . Right Foot - Follow-up  . Left Foot - Follow-up      HPI: Patient is a 53 year old gentleman who presents for 3 separate issues.  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 Zannie Kehr.  Assessment & Plan: Visit Diagnoses:  1. History of transmetatarsal amputation of right foot ([place])   2. Ulcer of left foot, limited to breakdown of skin (HCC)   3. Weakness of left upper extremity     Plan: We will follow-up with patient after his nerve conduction studies with Dr. Ernestina Patches.  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  Patient was given instructions to work on passive range of motion of the fingers hand and wrist.  Continue with protective shoe wear and discussed the possibility of setting up therapy once the nerve conduction studies have finalized.  Prescription for Silvadene.  Follow-Up Instructions: Return in about 3 weeks (around 11/24/2018).   Ortho Exam  Patient is alert, oriented, no adenopathy, well-dressed, normal affect, normal respiratory effort. Examination patient has a Wegner grade 1 ulcer beneath the left first metatarsal head he has good pulses.  After informed consent a 10 blade knife was used to debride the skin and soft tissue back to healthy viable granulation tissue the ulcer is 2 cm in diameter 3 mm deep silver nitrate was used for hemostasis a Band-Aid was applied.  Examination of the right foot he has a stable well-healed  transmetatarsal amputation with callus he has a Wagner grade 1 ulcer beneath the forefoot a 10 blade knife was used to debride the skin and soft tissue back to healthy viable granulation tissue this ulcer is 10 mm in diameter 2 mm deep.  Examination of patient's left upper extremity has an isolated radial nerve palsy.  He has full active flexion and extension at the elbow which is 5/5.  He has full grip strength of all fingers he has wrist drop cannot extend the wrist cannot extend the fingers cannot extend the thumb.  He has good sensation in all fingers with good radial and ulnar nerve sensation.  Patient cannot cross his fingers abduct or abduct his fingers but most likely secondary to the radial nerve palsy.  Imaging: No results found. No images are attached to the encounter.  Labs: Lab Results  Component Value Date   HGBA1C 6.4 (H) 10/31/2018   HGBA1C 6.2 (H) 06/13/2018   HGBA1C 6.7 (H) 06/07/2017   ESRSEDRATE 30 (H) 06/13/2018   ESRSEDRATE 5 06/04/2017   ESRSEDRATE 110 (H) 12/19/2013   CRP 5.7 (H) 06/13/2018   CRP <0.8 06/05/2017   CRP 12.2 (H) 12/19/2013   REPTSTATUS 12/24/2013 FINAL 12/18/2013   REPTSTATUS 12/24/2013 FINAL 12/18/2013   GRAMSTAIN  04/19/2011    WBC PRESENT,BOTH PMN AND MONONUCLEAR NO ORGANISMS SEEN   CULT  12/18/2013    NO GROWTH 5  DAYS Performed at [facility]  12/18/2013    NO GROWTH 5 DAYS Performed at [facility]      Lab Results  Component Value Date   ALBUMIN 3.9 10/31/2018   ALBUMIN 2.9 (L) 06/15/2018   ALBUMIN 2.8 (L) 06/14/2018    Lab Results  Component Value Date   MG 1.8 11/01/2018   MG 1.9 06/15/2018   MG 1.9 06/14/2018   No results found for: VD25OH  No results found for: PREALBUMIN CBC EXTENDED Latest Ref Rng & Units 11/01/2018 10/31/2018 06/17/2018  WBC 4.0 - 10.5 K/uL 5.2 6.9 7.7  RBC 4.22 - 5.81 MIL/uL 4.07(L) 4.64 4.69  HGB 13.0 - 17.0 g/dL 11.0(L) 12.4(L) 13.0  HCT 39.0 - 52.0 % 34.3(L) 38.8(L)  39.0  PLT 150 - 400 K/uL 176 204 232  NEUTROABS 1.7 - 7.7 K/uL - 5.2 -  LYMPHSABS 0.7 - 4.0 K/uL - 0.9 -     There is no height or weight on file to calculate BMI.  Orders:  No orders of the defined types were placed in this encounter.  No orders of the defined types were placed in this encounter.    Procedures: No procedures performed  Clinical Data: No additional findings.  ROS:  All other systems negative, except as noted in the HPI. Review of Systems  Objective: Vital Signs: There were no vitals taken for this visit.  Specialty Comments:  No specialty comments available.  PMFS History: Patient Active Problem List   Diagnosis Date Noted  . Acute encephalopathy 10/31/2018  . Accidental drug overdose 10/31/2018  . Left arm weakness 10/31/2018  . History of transmetatarsal amputation of right foot ([place]) 08/30/2018  . Charcot foot due to diabetes mellitus ([place])   . Cellulitis 06/12/2018  . Diabetic foot infection ([place]) 06/22/2017  . Post op infection 06/22/2017  . SVT (supraventricular tachycardia) ([place]) 08/17/2016  . Tobacco use disorder 10/25/2014  . Diabetes mellitus type 2 in nonobese (HCC)   . Swelling   . Type 2 diabetes mellitus with left diabetic foot ulcer ([place]) 12/18/2013  . Diabetes mellitus due to underlying condition with foot ulcer ([place]) 12/18/2013  . Left leg cellulitis 12/18/2013  . Cocaine abuse ([place]) 03/29/2012  . Generalized anxiety disorder 03/29/2012  . Panic attacks 03/29/2012  . Alcohol dependence ([place]) 08/05/2011    Class: Chronic  . Substance abuse ([place]) 05/31/2011  . Ulcer of left foot, limited to breakdown of skin ([place]) 02/15/2011   Past Medical History:  Diagnosis Date  . Anxiety   . Arthritis    "everywhere" (04/21/2012)  . Cellulitis and abscess of foot 12/19/2013   LEFT FOOT  . Cellulitis of right foot   . Chronic pain   . DDD (degenerative disc disease)   . Depression   . Diabetes mellitus without complication (HCC)     borderline  . Diabetic foot ulcer ([place])   . ETOH abuse   . Full dentures   . GERD (gastroesophageal reflux disease)    tums  . H/O amputation of lesser toe, right ([place]) 07/29/2017  . History of blood transfusion    "related to left knee OR; probably right hip too" (04/21/2012)  . Mental disorder   . Neuromuscular disorder (HCC)    neuropathy  . Noncompliance   . Open wound    bottom of foot  . Osteomyelitis ([place])    right 4th toe  . Osteomyelitis of right foot ([place])   . Pneumonia ~ 2012  .  Polysubstance abuse (HCC)    etoh, cocaine, heroin  . Stroke [facility]) 2008   "they said I might have had one during right hip replacement" (04/21/2012)  . Wears glasses     Family History  Problem Relation Age of Onset  . Diabetes Father     Past Surgical History:  Procedure Laterality Date  . AMPUTATION Right 06/09/2017   Procedure: RIGHT 2ND TOE AMPUTATION;  Surgeon: [name], MD;  Location: [place];  Service: Orthopedics;  Laterality: Right;  . AMPUTATION Right 07/23/2017   Procedure: RIGHT 4TH TOE AMPUTATION;  Surgeon: [name], MD;  Location: [place];  Service: Orthopedics;  Laterality: Right;  . AMPUTATION Right 06/15/2018   Procedure: RIGHT FOOT TRANSMETATARSAL AMPUTATION;  Surgeon: [name], MD;  Location: [place];  Service: Orthopedics;  Laterality: Right;  right  . JOINT REPLACEMENT    . KNEE ARTHROSCOPY Bilateral 1980's/1990's  . LUNG LOBECTOMY Left ~ 2006  . LUNG LOBECTOMY    . METATARSAL OSTEOTOMY  10/29/2011   Procedure: METATARSAL OSTEOTOMY;  Surgeon: [name], MD;  Location: [place];  Service: Orthopedics;  Laterality: Left;  Left 1st Metatarsal Dorsal Closing Wedge   . METATARSAL OSTEOTOMY Right 04/28/2017   Procedure: RIGHT 1ST METATARSAL DORSAL CLOSING WEDGE OSTEOTOMY;  Surgeon: [name], MD;  Location: [place];  Service: Orthopedics;  Laterality: Right;  . MULTIPLE TOOTH EXTRACTIONS    . REVISION TOTAL HIP ARTHROPLASTY Right 2008   "4-5 months after  replacement" (04/21/2012)  . TOTAL HIP ARTHROPLASTY Right 2008  . TOTAL KNEE ARTHROPLASTY Left 2006  . TOTAL KNEE ARTHROPLASTY Right 04/20/2012  . TOTAL KNEE ARTHROPLASTY Right 04/20/2012   Procedure: TOTAL KNEE ARTHROPLASTY;  Surgeon: [name], MD;  Location: [place];  Service: Orthopedics;  Laterality: Right;  Right Total Knee Arthroplasty   Social History   Occupational History  . Not on file  Tobacco Use  . Smoking status: Current Every Day Smoker    Packs/day: 1.00    Years: 30.00    Pack years: 30.00    Types: Cigarettes  . Smokeless tobacco: Never Used  Substance and Sexual Activity  . Alcohol use: Not Currently    Alcohol/week: 0.0 standard drinks    Frequency: Never  . Drug use: Not Currently    Types: Cocaine    Comment: Cocaine and heroin - 10 years ago  . Sexual activity: Yes    Birth control/protection: None

## 2018-11-03 NOTE — Addendum Note (Signed)
Addended by: Meridee Score on: 11/03/2018 04:36 PM   Modules accepted: Orders

## 2018-11-15 ENCOUNTER — Encounter: Payer: Medicare Other | Admitting: Physical Medicine and Rehabilitation

## 2018-11-16 ENCOUNTER — Telehealth: Payer: Self-pay

## 2018-11-16 ENCOUNTER — Emergency Department (HOSPITAL_COMMUNITY): Payer: Medicare Other

## 2018-11-16 ENCOUNTER — Encounter (HOSPITAL_COMMUNITY): Payer: Self-pay | Admitting: Emergency Medicine

## 2018-11-16 ENCOUNTER — Other Ambulatory Visit: Payer: Self-pay

## 2018-11-16 ENCOUNTER — Inpatient Hospital Stay (HOSPITAL_COMMUNITY)
Admission: EM | Admit: 2018-11-16 | Discharge: 2018-12-01 | DRG: 467 | Disposition: A | Discharge status: Home Health | Payer: Medicare Other | Attending: Internal Medicine | Admitting: Internal Medicine

## 2018-11-16 DIAGNOSIS — A419 Sepsis, unspecified organism: Secondary | ICD-10-CM | POA: Diagnosis present

## 2018-11-16 DIAGNOSIS — Y831 Surgical operation with implant of artificial internal device as the cause of abnormal reaction of the patient, or of later complication, without mention of misadventure at the time of the procedure: Secondary | ICD-10-CM | POA: Diagnosis present

## 2018-11-16 DIAGNOSIS — Z8631 Personal history of diabetic foot ulcer: Secondary | ICD-10-CM | POA: Diagnosis not present

## 2018-11-16 DIAGNOSIS — E1140 Type 2 diabetes mellitus with diabetic neuropathy, unspecified: Secondary | ICD-10-CM | POA: Diagnosis present

## 2018-11-16 DIAGNOSIS — Z7984 Long term (current) use of oral hypoglycemic drugs: Secondary | ICD-10-CM

## 2018-11-16 DIAGNOSIS — Z89431 Acquired absence of right foot: Secondary | ICD-10-CM | POA: Diagnosis not present

## 2018-11-16 DIAGNOSIS — F1010 Alcohol abuse, uncomplicated: Secondary | ICD-10-CM | POA: Diagnosis present

## 2018-11-16 DIAGNOSIS — E785 Hyperlipidemia, unspecified: Secondary | ICD-10-CM | POA: Diagnosis present

## 2018-11-16 DIAGNOSIS — Z20828 Contact with and (suspected) exposure to other viral communicable diseases: Secondary | ICD-10-CM | POA: Diagnosis present

## 2018-11-16 DIAGNOSIS — L97529 Non-pressure chronic ulcer of other part of left foot with unspecified severity: Secondary | ICD-10-CM | POA: Diagnosis present

## 2018-11-16 DIAGNOSIS — G4700 Insomnia, unspecified: Secondary | ICD-10-CM | POA: Diagnosis present

## 2018-11-16 DIAGNOSIS — B9562 Methicillin resistant Staphylococcus aureus infection as the cause of diseases classified elsewhere: Secondary | ICD-10-CM | POA: Diagnosis present

## 2018-11-16 DIAGNOSIS — D649 Anemia, unspecified: Secondary | ICD-10-CM | POA: Diagnosis present

## 2018-11-16 DIAGNOSIS — E861 Hypovolemia: Secondary | ICD-10-CM | POA: Diagnosis present

## 2018-11-16 DIAGNOSIS — F17210 Nicotine dependence, cigarettes, uncomplicated: Secondary | ICD-10-CM | POA: Diagnosis present

## 2018-11-16 DIAGNOSIS — R200 Anesthesia of skin: Secondary | ICD-10-CM | POA: Diagnosis present

## 2018-11-16 DIAGNOSIS — D509 Iron deficiency anemia, unspecified: Secondary | ICD-10-CM | POA: Diagnosis present

## 2018-11-16 DIAGNOSIS — R7881 Bacteremia: Secondary | ICD-10-CM

## 2018-11-16 DIAGNOSIS — M00061 Staphylococcal arthritis, right knee: Secondary | ICD-10-CM | POA: Diagnosis present

## 2018-11-16 DIAGNOSIS — M009 Pyogenic arthritis, unspecified: Secondary | ICD-10-CM | POA: Diagnosis not present

## 2018-11-16 DIAGNOSIS — E1151 Type 2 diabetes mellitus with diabetic peripheral angiopathy without gangrene: Secondary | ICD-10-CM | POA: Diagnosis present

## 2018-11-16 DIAGNOSIS — E119 Type 2 diabetes mellitus without complications: Secondary | ICD-10-CM | POA: Diagnosis not present

## 2018-11-16 DIAGNOSIS — Z22322 Carrier or suspected carrier of Methicillin resistant Staphylococcus aureus: Secondary | ICD-10-CM | POA: Diagnosis not present

## 2018-11-16 DIAGNOSIS — K5900 Constipation, unspecified: Secondary | ICD-10-CM | POA: Diagnosis present

## 2018-11-16 DIAGNOSIS — E11621 Type 2 diabetes mellitus with foot ulcer: Secondary | ICD-10-CM | POA: Diagnosis present

## 2018-11-16 DIAGNOSIS — E871 Hypo-osmolality and hyponatremia: Secondary | ICD-10-CM | POA: Diagnosis present

## 2018-11-16 DIAGNOSIS — Z96651 Presence of right artificial knee joint: Secondary | ICD-10-CM | POA: Diagnosis not present

## 2018-11-16 DIAGNOSIS — E876 Hypokalemia: Secondary | ICD-10-CM

## 2018-11-16 DIAGNOSIS — Z902 Acquired absence of lung [part of]: Secondary | ICD-10-CM

## 2018-11-16 DIAGNOSIS — Z96659 Presence of unspecified artificial knee joint: Principal | ICD-10-CM

## 2018-11-16 DIAGNOSIS — Z888 Allergy status to other drugs, medicaments and biological substances status: Secondary | ICD-10-CM | POA: Diagnosis not present

## 2018-11-16 DIAGNOSIS — Z833 Family history of diabetes mellitus: Secondary | ICD-10-CM | POA: Diagnosis not present

## 2018-11-16 DIAGNOSIS — L97519 Non-pressure chronic ulcer of other part of right foot with unspecified severity: Secondary | ICD-10-CM | POA: Diagnosis present

## 2018-11-16 DIAGNOSIS — Z96641 Presence of right artificial hip joint: Secondary | ICD-10-CM | POA: Diagnosis present

## 2018-11-16 DIAGNOSIS — Z96652 Presence of left artificial knee joint: Secondary | ICD-10-CM | POA: Diagnosis present

## 2018-11-16 DIAGNOSIS — R109 Unspecified abdominal pain: Secondary | ICD-10-CM

## 2018-11-16 DIAGNOSIS — T8450XA Infection and inflammatory reaction due to unspecified internal joint prosthesis, initial encounter: Secondary | ICD-10-CM | POA: Diagnosis not present

## 2018-11-16 DIAGNOSIS — T8459XA Infection and inflammatory reaction due to other internal joint prosthesis, initial encounter: Principal | ICD-10-CM

## 2018-11-16 DIAGNOSIS — I471 Supraventricular tachycardia: Secondary | ICD-10-CM | POA: Diagnosis not present

## 2018-11-16 DIAGNOSIS — T8453XA Infection and inflammatory reaction due to internal right knee prosthesis, initial encounter: Principal | ICD-10-CM | POA: Diagnosis present

## 2018-11-16 DIAGNOSIS — F1410 Cocaine abuse, uncomplicated: Secondary | ICD-10-CM | POA: Diagnosis present

## 2018-11-16 DIAGNOSIS — B9561 Methicillin susceptible Staphylococcus aureus infection as the cause of diseases classified elsewhere: Secondary | ICD-10-CM

## 2018-11-16 DIAGNOSIS — M0000 Staphylococcal arthritis, unspecified joint: Secondary | ICD-10-CM | POA: Diagnosis not present

## 2018-11-16 DIAGNOSIS — Z89422 Acquired absence of other left toe(s): Secondary | ICD-10-CM | POA: Diagnosis not present

## 2018-11-16 LAB — CBC WITH DIFFERENTIAL/PLATELET
Abs Immature Granulocytes: 0.18 10*3/uL — ABNORMAL HIGH (ref 0.00–0.07)
Basophils Absolute: 0.1 10*3/uL (ref 0.0–0.1)
Basophils Relative: 0 %
Eosinophils Absolute: 0.1 10*3/uL (ref 0.0–0.5)
Eosinophils Relative: 0 %
HCT: 36.4 % — ABNORMAL LOW (ref 39.0–52.0)
Hemoglobin: 11.5 g/dL — ABNORMAL LOW (ref 13.0–17.0)
Immature Granulocytes: 1 %
Lymphocytes Relative: 11 %
Lymphs Abs: 1.7 10*3/uL (ref 0.7–4.0)
MCH: 25.9 pg — ABNORMAL LOW (ref 26.0–34.0)
MCHC: 31.6 g/dL (ref 30.0–36.0)
MCV: 82 fL (ref 80.0–100.0)
Monocytes Absolute: 1.1 10*3/uL — ABNORMAL HIGH (ref 0.1–1.0)
Monocytes Relative: 7 %
Neutro Abs: 12.8 10*3/uL — ABNORMAL HIGH (ref 1.7–7.7)
Neutrophils Relative %: 81 %
Platelets: 429 10*3/uL — ABNORMAL HIGH (ref 150–400)
RBC: 4.44 MIL/uL (ref 4.22–5.81)
RDW: 15.3 % (ref 11.5–15.5)
WBC: 15.8 10*3/uL — ABNORMAL HIGH (ref 4.0–10.5)
nRBC: 0 % (ref 0.0–0.2)

## 2018-11-16 LAB — COMPREHENSIVE METABOLIC PANEL
ALT: 30 U/L (ref 0–44)
AST: 16 U/L (ref 15–41)
Albumin: 3.1 g/dL — ABNORMAL LOW (ref 3.5–5.0)
Alkaline Phosphatase: 227 U/L — ABNORMAL HIGH (ref 38–126)
Anion gap: 12 (ref 5–15)
BUN: 20 mg/dL (ref 6–20)
CO2: 22 mmol/L (ref 22–32)
Calcium: 9.6 mg/dL (ref 8.9–10.3)
Chloride: 104 mmol/L (ref 98–111)
Creatinine, Ser: 1 mg/dL (ref 0.61–1.24)
GFR calc Af Amer: >60 mL/min (ref >60)
GFR calc non Af Amer: >60 mL/min (ref >60)
Glucose, Bld: 206 mg/dL — ABNORMAL HIGH (ref 70–99)
Potassium: 3.2 mmol/L — ABNORMAL LOW (ref 3.5–5.1)
Sodium: 138 mmol/L (ref 135–145)
Total Bilirubin: 0.6 mg/dL (ref 0.3–1.2)
Total Protein: 8.2 g/dL — ABNORMAL HIGH (ref 6.5–8.1)

## 2018-11-16 LAB — SYNOVIAL CELL COUNT + DIFF, W/ CRYSTALS
Crystals, Fluid: NONE SEEN
Eosinophils-Synovial: 0 % (ref 0–1)
Lymphocytes-Synovial Fld: 7 % (ref 0–20)
Monocyte-Macrophage-Synovial Fluid: 0 % — ABNORMAL LOW (ref 50–90)
Neutrophil, Synovial: 93 % — ABNORMAL HIGH (ref 0–25)
Other Cells-SYN: 0
WBC, Synovial: 16050 /mm3 — ABNORMAL HIGH (ref 0–200)

## 2018-11-16 LAB — CBG MONITORING, ED
Glucose-Capillary: 169 mg/dL — ABNORMAL HIGH (ref 70–99)
Glucose-Capillary: 189 mg/dL — ABNORMAL HIGH (ref 70–99)

## 2018-11-16 LAB — C-REACTIVE PROTEIN: CRP: 36 mg/dL — ABNORMAL HIGH (ref <1.0)

## 2018-11-16 LAB — GRAM STAIN

## 2018-11-16 LAB — SEDIMENTATION RATE: Sed Rate: 120 mm/hr — ABNORMAL HIGH (ref 0–16)

## 2018-11-16 MED ORDER — ENOXAPARIN SODIUM 40 MG/0.4ML ~~LOC~~ SOLN
40.0000 mg | SUBCUTANEOUS | Status: DC
  Administered 2018-11-19 – 2018-12-01 (×12): 40 mg via SUBCUTANEOUS
  Filled 2018-11-16 (×13): qty 0.4

## 2018-11-16 MED ORDER — INSULIN ASPART 100 UNIT/ML ~~LOC~~ SOLN
0.0000 [IU] | SUBCUTANEOUS | Status: DC
  Administered 2018-11-16: 2 [IU] via SUBCUTANEOUS
  Administered 2018-11-17: 09:00:00 1 [IU] via SUBCUTANEOUS
  Administered 2018-11-17: 04:00:00 2 [IU] via SUBCUTANEOUS
  Filled 2018-11-16: qty 1

## 2018-11-16 MED ORDER — VANCOMYCIN HCL 10 G IV SOLR
2000.0000 mg | Freq: Once | INTRAVENOUS | Status: DC
  Filled 2018-11-16: qty 2000

## 2018-11-16 MED ORDER — LIDOCAINE HCL (PF) 1 % IJ SOLN
5.0000 mL | Freq: Once | INTRAMUSCULAR | Status: AC
  Administered 2018-11-16: 21:00:00 5 mL

## 2018-11-16 MED ORDER — ACETAMINOPHEN 650 MG RE SUPP
650.0000 mg | Freq: Four times a day (QID) | RECTAL | Status: DC | PRN
  Administered 2018-11-18: 06:00:00 650 mg via RECTAL
  Filled 2018-11-16: qty 1

## 2018-11-16 MED ORDER — SODIUM CHLORIDE 0.9 % IV SOLN
2.0000 g | Freq: Once | INTRAVENOUS | Status: AC
  Administered 2018-11-16: 2 g via INTRAVENOUS
  Filled 2018-11-16: qty 2

## 2018-11-16 MED ORDER — LIDOCAINE HCL (PF) 2 % IJ SOLN: 5.0000 mL | Freq: Once | INTRAMUSCULAR | Status: DC

## 2018-11-16 MED ORDER — POTASSIUM CHLORIDE IN NACL 20-0.9 MEQ/L-% IV SOLN
INTRAVENOUS | Status: AC
  Administered 2018-11-17: 04:00:00 via INTRAVENOUS
  Filled 2018-11-16 (×2): qty 1000

## 2018-11-16 MED ORDER — ACETAMINOPHEN 325 MG PO TABS
650.0000 mg | ORAL_TABLET | Freq: Four times a day (QID) | ORAL | Status: DC | PRN
  Administered 2018-11-17 – 2018-12-01 (×28): 650 mg via ORAL
  Filled 2018-11-16 (×29): qty 2

## 2018-11-16 MED ORDER — FENTANYL CITRATE (PF) 100 MCG/2ML IJ SOLN
100.0000 ug | Freq: Once | INTRAMUSCULAR | Status: AC
  Administered 2018-11-16: 21:00:00 100 ug via INTRAVENOUS
  Filled 2018-11-16: qty 2

## 2018-11-16 MED ORDER — LIDOCAINE HCL (PF) 1 % IJ SOLN
INTRAMUSCULAR | Status: AC
  Administered 2018-11-16: 21:00:00 5 mL
  Filled 2018-11-16: qty 30

## 2018-11-16 NOTE — H&P (Signed)
TRH H&P    Patient Demographics:    [name], is a 53 y.o. male  MRN: [id number]  DOB - Aug 07, 1965  Admit Date - 11/16/2018  Referring MD/NP/PA:  Alvino Chapel  Outpatient Primary MD for the patient is Lucia Gaskins, MD  Patient coming from:  home  Chief complaint-  Right knee swelling   HPI:    [name]  is a 53 y.o. male,  H/o polysubstance abuse (etoh, coccaine), Anxiety,  Dm2, CVA, h/o osteomyelitis right foot s/p transmetatarsal amputation 06/15/2018, L TKA, R THA w revision 2008, R TKA 04/20/2012, presents with c/o swelling of the right knee over the past 2 days.  Pt denies fever, chills.  Pt notes had taken a trip and the dash might have irritated his knee.   In ED,  T 98.1, P 101, R 18, Bp 166/76  Pox 99% on RA Wt 86.2kg  Na 138, K 3.2, Bun 20, Creatinine 1.00 Ast 16, Alt 30, Alk phos 227, T. Bili 0.6 Glucose 206  Wbc 15.8, Hgb 11.5, Plt 429  ESR 120  R knee cell count Wbc 16,050   Neutrophils 93%  ED spoke with Ninfa Linden who recommended per ED transfer to [facility] and then consultation with Dr. Sharol Given in AM  Pt will be admitted for suspicion of R knee septic joint.     Review of systems:    In addition to the HPI above,  No Fever-chills, No Headache, No changes with Vision or hearing, No problems swallowing food or Liquids, No Chest pain, Cough or Shortness of Breath, No Abdominal pain, No Nausea or Vomiting, bowel movements are regular, No Blood in stool or Urine, No dysuria,   No new weakness, tingling, numbness in any extremity, No recent weight gain or loss, No polyuria, polydypsia or polyphagia, No significant Mental Stressors.  All other systems reviewed and are negative.    Past History of the following :    Past Medical History:  Diagnosis Date   Anxiety    Arthritis    "everywhere" (04/21/2012)   Cellulitis and abscess of foot 12/19/2013   LEFT FOOT    Cellulitis of right foot    Chronic pain    DDD (degenerative disc disease)    Depression    Diabetes mellitus without complication ([place])    borderline   Diabetic foot ulcer ([place])    ETOH abuse    Full dentures    GERD (gastroesophageal reflux disease)    tums   H/O amputation of lesser toe, right ([place]) 07/29/2017   History of blood transfusion    "related to left knee OR; probably right hip too" (04/21/2012)   Mental disorder    Neuromuscular disorder ([place])    neuropathy   Noncompliance    Open wound    bottom of foot   Osteomyelitis ([place])    right 4th toe   Osteomyelitis of right foot ([place])    Pneumonia ~ 2012   Polysubstance abuse ([place])    etoh, cocaine, heroin   Stroke [facility]) 2008   "they  said I might have had one during right hip replacement" (04/21/2012)   Wears glasses       Past Surgical History:  Procedure Laterality Date   AMPUTATION Right 06/09/2017   Procedure: RIGHT 2ND TOE AMPUTATION;  Surgeon: [name], MD;  Location: [place];  Service: Orthopedics;  Laterality: Right;   AMPUTATION Right 07/23/2017   Procedure: RIGHT 4TH TOE AMPUTATION;  Surgeon: [name], MD;  Location: [place];  Service: Orthopedics;  Laterality: Right;   AMPUTATION Right 06/15/2018   Procedure: RIGHT FOOT TRANSMETATARSAL AMPUTATION;  Surgeon: [name], MD;  Location: [place];  Service: Orthopedics;  Laterality: Right;  right   JOINT REPLACEMENT     KNEE ARTHROSCOPY Bilateral 1980's/1990's   LUNG LOBECTOMY Left ~ 2006   LUNG LOBECTOMY     METATARSAL OSTEOTOMY  10/29/2011   Procedure: METATARSAL OSTEOTOMY;  Surgeon: [name], MD;  Location: [place];  Service: Orthopedics;  Laterality: Left;  Left 1st Metatarsal Dorsal Closing Wedge    METATARSAL OSTEOTOMY Right 04/28/2017   Procedure: RIGHT 1ST METATARSAL DORSAL CLOSING WEDGE OSTEOTOMY;  Surgeon: [name], MD;  Location: [place];  Service: Orthopedics;  Laterality: Right;   MULTIPLE TOOTH EXTRACTIONS      REVISION TOTAL HIP ARTHROPLASTY Right 2008   "4-5 months after replacement" (04/21/2012)   TOTAL HIP ARTHROPLASTY Right 2008   TOTAL KNEE ARTHROPLASTY Left 2006   TOTAL KNEE ARTHROPLASTY Right 04/20/2012   TOTAL KNEE ARTHROPLASTY Right 04/20/2012   Procedure: TOTAL KNEE ARTHROPLASTY;  Surgeon: [name], MD;  Location: [place];  Service: Orthopedics;  Laterality: Right;  Right Total Knee Arthroplasty      Social History:      Social History   Tobacco Use   Smoking status: Current Every Day Smoker    Packs/day: 1.00    Years: 30.00    Pack years: 30.00    Types: Cigarettes   Smokeless tobacco: Never Used  Substance Use Topics   Alcohol use: Not Currently    Alcohol/week: 0.0 standard drinks    Frequency: Never       Family History :     Family History  Problem Relation Age of Onset   Diabetes Father        Home Medications:   Prior to Admission medications   Medication Sig Start Date End Date Taking? Authorizing Provider  metFORMIN (GLUCOPHAGE) 500 MG tablet Take 2 tablets (1,000 mg total) by mouth 2 (two) times daily with a meal. 06/17/18   Hall, Lorenda Cahill, DO  nicotine (NICODERM CQ - DOSED IN MG/24 HOURS) 21 mg/24hr patch Place 1 patch (21 mg total) onto the skin daily. 06/17/18   Kayleen Memos, DO  nitroGLYCERIN (NITRODUR - DOSED IN MG/24 HR) 0.2 mg/hr patch Place 1 patch (0.2 mg total) onto the skin daily. 07/14/18   Rayburn, Neta Mends, PA-C  Oxycodone HCl 10 MG TABS Take 10 mg by mouth 4 (four) times daily as needed (for pain).    [provider]  pentoxifylline (TRENTAL) 400 MG CR tablet Take 1 tablet (400 mg total) by mouth 3 (three) times daily with meals. 07/14/18   Rayburn, Neta Mends, PA-C  polyethylene glycol (MIRALAX / GLYCOLAX) 17 g packet Take 17 g by mouth daily as needed for mild constipation. 06/17/18   Kayleen Memos, DO  rosuvastatin (CRESTOR) 20 MG tablet Take 1 tablet (20 mg total) by mouth daily. 06/17/18   Kayleen Memos, DO  senna-docusate (  SENOKOT-S) 8.6-50 MG tablet Take 2 tablets by mouth at bedtime. 06/17/18   Kayleen Memos, DO  silver sulfADIAZINE (SILVADENE) 1 % cream Apply 1 application topically daily. 11/03/18   [name], MD     Allergies:     Allergies  Allergen Reactions   Benadryl [Diphenhydramine Hcl] Other (See Comments)    Leg spasms    Trazodone And Nefazodone Other (See Comments)    Leg spasms      Physical Exam:   Vitals  Blood pressure (!) 146/71, pulse (!) 113, temperature 98.1 F (36.7 C), temperature source Oral, resp. rate 18, height 6' 3"  (1.905 m), weight 86.2 kg, SpO2 97 %.  1.  General: axoxo3  2. Psychiatric: euthymic  3. Neurologic: Cn 2-12 intact, reflexes 2+ symmetric, diffuse with no clonus, motor 5/5 in all 4 ext  4. HEENMT:  Anicteric, pupils 1.55m symmetric, direct, consensual, near intact Neck: no jvd  5. Respiratory : CTAB  6. Cardiovascular : rrr s1, s2, no m/g/r  7. Gastrointestinal:  Abd: soft, nt, nd, +bs  8. Skin:  No c/c/,  Right knee swelling and warmth and erythema, R TKA scar  9.Musculoskeletal:  Good ROM    Data Review:    CBC Recent Labs  Lab 11/16/18 2038  WBC 15.8*  HGB 11.5*  HCT 36.4*  PLT 429*  MCV 82.0  MCH 25.9*  MCHC 31.6  RDW 15.3  LYMPHSABS 1.7  MONOABS 1.1*  EOSABS 0.1  BASOSABS 0.1   ------------------------------------------------------------------------------------------------------------------  Results for orders placed or performed during the hospital encounter of 11/16/18 (from the past 48 hour(s))  Comprehensive metabolic panel     Status: Abnormal   Collection Time: 11/16/18  8:38 PM  Result Value Ref Range   Sodium 138 135 - 145 mmol/L   Potassium 3.2 (L) 3.5 - 5.1 mmol/L   Chloride 104 98 - 111 mmol/L   CO2 22 22 - 32 mmol/L   Glucose, Bld 206 (H) 70 - 99 mg/dL   BUN 20 6 - 20 mg/dL   Creatinine, Ser 1.00 0.61 - 1.24 mg/dL   Calcium 9.6 8.9 - 10.3 mg/dL   Total Protein  8.2 (H) 6.5 - 8.1 g/dL   Albumin 3.1 (L) 3.5 - 5.0 g/dL   AST 16 15 - 41 U/L   ALT 30 0 - 44 U/L   Alkaline Phosphatase 227 (H) 38 - 126 U/L   Total Bilirubin 0.6 0.3 - 1.2 mg/dL   GFR calc non Af Amer >60 >60 mL/min   GFR calc Af Amer >60 >60 mL/min   Anion gap 12 5 - 15    Comment: [facility] [id number] [place] [id number] CBC with Differential     Status: Abnormal   Collection Time: 11/16/18  8:38 PM  Result Value Ref Range   WBC 15.8 (H) 4.0 - 10.5 K/uL   RBC 4.44 4.22 - 5.81 MIL/uL   Hemoglobin 11.5 (L) 13.0 - 17.0 g/dL   HCT 36.4 (L) 39.0 - 52.0 %   MCV 82.0 80.0 - 100.0 fL   MCH 25.9 (L) 26.0 - 34.0 pg   MCHC 31.6 30.0 - 36.0 g/dL   RDW 15.3 11.5 - 15.5 %   Platelets 429 (H) 150 - 400 K/uL   nRBC 0.0 0.0 - 0.2 %   Neutrophils Relative % 81 %   Neutro Abs 12.8 (H) 1.7 - 7.7 K/uL   Lymphocytes Relative 11 %   Lymphs Abs  1.7 0.7 - 4.0 K/uL   Monocytes Relative 7 %   Monocytes Absolute 1.1 (H) 0.1 - 1.0 K/uL   Eosinophils Relative 0 %   Eosinophils Absolute 0.1 0.0 - 0.5 K/uL   Basophils Relative 0 %   Basophils Absolute 0.1 0.0 - 0.1 K/uL   Immature Granulocytes 1 %   Abs Immature Granulocytes 0.18 (H) 0.00 - 0.07 K/uL    Comment: Performed at [facility], [address] [place], [place] [address]  Sedimentation rate     Status: Abnormal   Collection Time: 11/16/18  8:38 PM  Result Value Ref Range   Sed Rate 120 (H) 0 - 16 mm/hr    Comment: Performed at [facility], [address] [place], [place] [address]  C-reactive protein     Status: Abnormal   Collection Time: 11/16/18  8:38 PM  Result Value Ref Range   CRP 36 (H) <1.0 mg/dL    Comment: RESULTS CONFIRMED BY MANUAL DILUTION Performed at [facility], [address], [place], [place] [address]   CBG monitoring, ED     Status: Abnormal   Collection Time: 11/16/18  8:42 PM  Result Value Ref Range   Glucose-Capillary 189 (H) 70 - 99 mg/dL  Gram stain     Status: None   Collection  Time: 11/16/18  9:00 PM   Specimen: Synovium; Body Fluid  Result Value Ref Range   Specimen Description SYNOVIAL    Special Requests KNEE,JOINT    Gram Stain      ABUNDANT WBC PRESENT,BOTH PMN AND MONONUCLEAR NO ORGANISMS SEEN Performed at [facility], [address] [place],  [address]    Report Status 11/16/2018 FINAL   Synovial cell count + diff, w/ crystals     Status: Abnormal   Collection Time: 11/16/18  9:05 PM  Result Value Ref Range   Color, Synovial YELLOW YELLOW   Appearance-Synovial CLOUDY (A) CLEAR   Crystals, Fluid NO CRYSTALS SEEN    WBC, Synovial 16,050 (H) 0 - 200 /cu mm   Neutrophil, Synovial 93 (H) 0 - 25 %   Lymphocytes-Synovial Fld 7 0 - 20 %   Monocyte-Macrophage-Synovial Fluid 0 (L) 50 - 90 %   Eosinophils-Synovial 0 0 - 1 %   Other Cells-SYN 0     Comment: Performed at [facility], [address] [place],  [address]    Chemistries  Recent Labs  Lab 11/16/18 2038  NA 138  K 3.2*  CL 104  CO2 22  GLUCOSE 206*  BUN 20  CREATININE 1.00  CALCIUM 9.6  AST 16  ALT 30  ALKPHOS 227*  BILITOT 0.6   ------------------------------------------------------------------------------------------------------------------  ------------------------------------------------------------------------------------------------------------------ GFR: Estimated Creatinine Clearance: 99.8 mL/min (by C-G formula based on SCr of 1 mg/dL). Liver Function Tests: Recent Labs  Lab 11/16/18 2038  AST 16  ALT 30  ALKPHOS 227*  BILITOT 0.6  PROT 8.2*  ALBUMIN 3.1*   No results for input(s): LIPASE, AMYLASE in the last 168 hours. No results for input(s): AMMONIA in the last 168 hours. Coagulation Profile: No results for input(s): INR, PROTIME in the last 168 hours. Cardiac Enzymes: No results for input(s): CKTOTAL, CKMB, CKMBINDEX, TROPONINI in the last 168 hours. BNP (last 3 results) No results for input(s): PROBNP in the last 8760 hours. HbA1C: No  results for input(s): HGBA1C in the last 72 hours. CBG: Recent Labs  Lab 11/16/18 2042  GLUCAP 189*   Lipid Profile: No results for input(s): CHOL, HDL, LDLCALC, TRIG, CHOLHDL,  LDLDIRECT in the last 72 hours. Thyroid Function Tests: No results for input(s): TSH, T4TOTAL, FREET4, T3FREE, THYROIDAB in the last 72 hours. Anemia Panel: No results for input(s): VITAMINB12, FOLATE, FERRITIN, TIBC, IRON, RETICCTPCT in the last 72 hours.  --------------------------------------------------------------------------------------------------------------- Urine analysis:    Component Value Date/Time   COLORURINE STRAW (A) 10/31/2018 1928   APPEARANCEUR CLEAR 10/31/2018 1928   LABSPEC 1.006 10/31/2018 1928   PHURINE 5.0 10/31/2018 1928   GLUCOSEU 50 (A) 10/31/2018 1928   HGBUR SMALL (A) 10/31/2018 1928   BILIRUBINUR NEGATIVE 10/31/2018 1928   KETONESUR 20 (A) 10/31/2018 1928   PROTEINUR NEGATIVE 10/31/2018 1928   UROBILINOGEN 0.2 12/18/2013 1402   NITRITE NEGATIVE 10/31/2018 1928   LEUKOCYTESUR NEGATIVE 10/31/2018 1928      Imaging Results:    Dg Knee Complete 4 Views Right  Result Date: 11/16/2018 CLINICAL DATA:  53 year old male with right knee pain. EXAM: RIGHT KNEE - COMPLETE 4+ VIEW COMPARISON:  None. FINDINGS: There is a total right knee arthroplasty. The arthroplasty components appear intact and in anatomic alignment. No evidence of loosening. The bones are mildly osteopenic. There is no acute fracture or dislocation. There is moderate suprapatellar and joint effusion. Correlation with clinical exam is recommended to evaluate for possible infection. The there is diffuse subcutaneous edema. No radiopaque foreign object or soft tissue gas. IMPRESSION: 1. No acute fracture or dislocation. 2. Total right knee arthroplasty appears intact and in anatomic alignment. 3. Moderate suprapatellar and joint effusion concerning for an infectious process. Clinical correlation is recommended.  Electronically Signed   By: Anner Crete M.D.   On: 11/16/2018 20:01       Assessment & Plan:    Principal Problem:   Septic joint of right knee joint (HCC) Active Problems:   Diabetes mellitus type 2 in nonobese (HCC)   Hypokalemia   Anemia  Septic joint  R knee Await synovial fluid culture Blood culture x2 Dilaudid 0.37m iv q4h prn  vanco iv, cefepime iv pharmacy to dose Please consult Dr. DSharol Givenin Am  Hypokalemia Replete Check cmp in am  Anemia Check cbc in am  Dm2 Hold Metformin  fsbs q4h, ISS  Hyperlipidemia,  PVD (ABI 0.73 in 2015) Cont Crestor 266mpo qhs Cont Pletal Consider Xarelto for PVD  Tachycardia Check cardiac echo  DVT Prophylaxis-   Lovenox - SCDs  AM Labs Ordered, also please review Full Orders  Family Communication: Admission, patients condition and plan of care including tests being ordered have been discussed with the patient  who indicate understanding and agree with the plan and Code Status.  Code Status:  FULL CODE per patient,   Admission status: Inpatient: Based on patients clinical presentation and evaluation of above clinical data, I have made determination that patient meets Inpatient criteria at this time. Pt may have septic joint, pt will require iv abx,  This will likely be protracted course.  Pt has high risk of clinical deterioration.  ESR 120,  Pt will require >2 nites stay.   Time spent in minutes : 70   JaJani Gravel.D on 11/16/2018 at 11:05 PM

## 2018-11-16 NOTE — ED Provider Notes (Signed)
[facility] EMERGENCY DEPARTMENT Provider Note   CSN: [id number] Arrival date & time: 11/16/18  1657     History   Chief Complaint Chief Complaint  Patient presents with  . Leg Pain    HPI Ryatt Carodine is a 53 y.o. male.     HPI Patient presents with right knee pain.  Has had for last couple days.  Has had previous and somewhat recent amputations of the toes on the right foot by Dr. Sharol Given.  Right knee replaced around 6 years ago by Dr. Sharol Given.  States that he has had some chills.  No chest pain or trouble breathing.  Had osteomyelitis of the right foot.  No trauma.  Had recently rode in a car to [place] 2 days ago also. Past Medical History:  Diagnosis Date  . Anxiety   . Arthritis    "everywhere" (04/21/2012)  . Cellulitis and abscess of foot 12/19/2013   LEFT FOOT  . Cellulitis of right foot   . Chronic pain   . DDD (degenerative disc disease)   . Depression   . Diabetes mellitus without complication (HCC)    borderline  . Diabetic foot ulcer ([place])   . ETOH abuse   . Full dentures   . GERD (gastroesophageal reflux disease)    tums  . H/O amputation of lesser toe, right ([place]) 07/29/2017  . History of blood transfusion    "related to left knee OR; probably right hip too" (04/21/2012)  . Mental disorder   . Neuromuscular disorder (HCC)    neuropathy  . Noncompliance   . Open wound    bottom of foot  . Osteomyelitis ([place])    right 4th toe  . Osteomyelitis of right foot ([place])   . Pneumonia ~ 2012  . Polysubstance abuse (HCC)    etoh, cocaine, heroin  . Stroke [facility]) 2008   "they said I might have had one during right hip replacement" (04/21/2012)  . Wears glasses     Patient Active Problem List   Diagnosis Date Noted  . Acute encephalopathy 10/31/2018  . Accidental drug overdose 10/31/2018  . Left arm weakness 10/31/2018  . History of transmetatarsal amputation of right foot ([place]) 08/30/2018  . Charcot foot due to diabetes mellitus ([place])   . Cellulitis  06/12/2018  . Diabetic foot infection ([place]) 06/22/2017  . Post op infection 06/22/2017  . SVT (supraventricular tachycardia) ([place]) 08/17/2016  . Tobacco use disorder 10/25/2014  . Diabetes mellitus type 2 in nonobese (HCC)   . Swelling   . Type 2 diabetes mellitus with left diabetic foot ulcer ([place]) 12/18/2013  . Diabetes mellitus due to underlying condition with foot ulcer ([place]) 12/18/2013  . Left leg cellulitis 12/18/2013  . Cocaine abuse ([place]) 03/29/2012  . Generalized anxiety disorder 03/29/2012  . Panic attacks 03/29/2012  . Alcohol dependence ([place]) 08/05/2011    Class: Chronic  . Substance abuse ([place]) 05/31/2011  . Ulcer of left foot, limited to breakdown of skin ([place]) 02/15/2011    Past Surgical History:  Procedure Laterality Date  . AMPUTATION Right 06/09/2017   Procedure: RIGHT 2ND TOE AMPUTATION;  Surgeon: [name], MD;  Location: [place];  Service: Orthopedics;  Laterality: Right;  . AMPUTATION Right 07/23/2017   Procedure: RIGHT 4TH TOE AMPUTATION;  Surgeon: [name], MD;  Location: [place];  Service: Orthopedics;  Laterality: Right;  . AMPUTATION Right 06/15/2018   Procedure: RIGHT FOOT TRANSMETATARSAL AMPUTATION;  Surgeon: [name], MD;  Location: [place];  Service: Orthopedics;  Laterality: Right;  right  . JOINT REPLACEMENT    . KNEE ARTHROSCOPY Bilateral 1980's/1990's  . LUNG LOBECTOMY Left ~ 2006  . LUNG LOBECTOMY    . METATARSAL OSTEOTOMY  10/29/2011   Procedure: METATARSAL OSTEOTOMY;  Surgeon: [name], MD;  Location: [place];  Service: Orthopedics;  Laterality: Left;  Left 1st Metatarsal Dorsal Closing Wedge   . METATARSAL OSTEOTOMY Right 04/28/2017   Procedure: RIGHT 1ST METATARSAL DORSAL CLOSING WEDGE OSTEOTOMY;  Surgeon: [name], MD;  Location: [place];  Service: Orthopedics;  Laterality: Right;  . MULTIPLE TOOTH EXTRACTIONS    . REVISION TOTAL HIP ARTHROPLASTY Right 2008   "4-5 months after replacement" (04/21/2012)  . TOTAL HIP ARTHROPLASTY  Right 2008  . TOTAL KNEE ARTHROPLASTY Left 2006  . TOTAL KNEE ARTHROPLASTY Right 04/20/2012  . TOTAL KNEE ARTHROPLASTY Right 04/20/2012   Procedure: TOTAL KNEE ARTHROPLASTY;  Surgeon: [name], MD;  Location: [place];  Service: Orthopedics;  Laterality: Right;  Right Total Knee Arthroplasty        Home Medications    Prior to Admission medications   Medication Sig Start Date End Date Taking? Authorizing Provider  metFORMIN (GLUCOPHAGE) 500 MG tablet Take 2 tablets (1,000 mg total) by mouth 2 (two) times daily with a meal. 06/17/18   Hall, Lorenda Cahill, DO  nicotine (NICODERM CQ - DOSED IN MG/24 HOURS) 21 mg/24hr patch Place 1 patch (21 mg total) onto the skin daily. 06/17/18   Kayleen Memos, DO  nitroGLYCERIN (NITRODUR - DOSED IN MG/24 HR) 0.2 mg/hr patch Place 1 patch (0.2 mg total) onto the skin daily. 07/14/18   Rayburn, Neta Mends, PA-C  Oxycodone HCl 10 MG TABS Take 10 mg by mouth 4 (four) times daily as needed (for pain).    [provider]  pentoxifylline (TRENTAL) 400 MG CR tablet Take 1 tablet (400 mg total) by mouth 3 (three) times daily with meals. 07/14/18   Rayburn, Neta Mends, PA-C  polyethylene glycol (MIRALAX / GLYCOLAX) 17 g packet Take 17 g by mouth daily as needed for mild constipation. 06/17/18   Kayleen Memos, DO  rosuvastatin (CRESTOR) 20 MG tablet Take 1 tablet (20 mg total) by mouth daily. 06/17/18   Kayleen Memos, DO  senna-docusate (SENOKOT-S) 8.6-50 MG tablet Take 2 tablets by mouth at bedtime. 06/17/18   Kayleen Memos, DO  silver sulfADIAZINE (SILVADENE) 1 % cream Apply 1 application topically daily. 11/03/18   [name], MD    Family History Family History  Problem Relation Age of Onset  . Diabetes Father     Social History Social History   Tobacco Use  . Smoking status: Current Every Day Smoker    Packs/day: 1.00    Years: 30.00    Pack years: 30.00    Types: Cigarettes  . Smokeless tobacco: Never Used  Substance Use Topics  .  Alcohol use: Not Currently    Alcohol/week: 0.0 standard drinks    Frequency: Never  . Drug use: Not Currently    Types: Cocaine    Comment: Cocaine and heroin - 10 years ago     Allergies   Benadryl [diphenhydramine hcl] and Trazodone and nefazodone   Review of Systems Review of Systems  Constitutional: Negative for appetite change.  Respiratory: Negative for shortness of breath.   Cardiovascular: Negative for chest pain.  Gastrointestinal: Negative for abdominal pain.  Musculoskeletal: Negative for back pain.       Right  knee pain and swelling.  Skin: Positive for wound.  Neurological: Negative for weakness.     Physical Exam Updated Vital Signs BP (!) 146/71   Pulse (!) 113   Temp 98.1 F (36.7 C) (Oral)   Resp 18   Ht 6\' 3"  (1.905 m)   Wt 86.2 kg   SpO2 97%   BMI 23.75 kg/m   Physical Exam Vitals signs and nursing note reviewed.  HENT:     Head: Atraumatic.  Eyes:     Extraocular Movements: Extraocular movements intact.  Neck:     Musculoskeletal: Neck supple.  Cardiovascular:     Rate and Rhythm: Regular rhythm.  Pulmonary:     Breath sounds: No wheezing or rhonchi.  Abdominal:     Tenderness: There is no abdominal tenderness.  Musculoskeletal:     Comments: Post amputation of toes on right foot.  Small ulcer on ball of foot but appears to be healing well.  Right knee swollen with some medial erythema.  Pain with movement of the knee.  Some inferior bruising also.  Skin:    Capillary Refill: Capillary refill takes less than 2 seconds.  Neurological:     Mental Status: He is alert and oriented to person, place, and time.        ED Treatments / Results  Labs (all labs ordered are listed, but only abnormal results are displayed) Labs Reviewed  COMPREHENSIVE METABOLIC PANEL - Abnormal; Notable for the following components:      Result Value   Potassium 3.2 (*)    Glucose, Bld 206 (*)    Total Protein 8.2 (*)    Albumin 3.1 (*)    Alkaline  Phosphatase 227 (*)    All other components within normal limits  CBC WITH DIFFERENTIAL/PLATELET - Abnormal; Notable for the following components:   WBC 15.8 (*)    Hemoglobin 11.5 (*)    HCT 36.4 (*)    MCH 25.9 (*)    Platelets 429 (*)    Neutro Abs 12.8 (*)    Monocytes Absolute 1.1 (*)    Abs Immature Granulocytes 0.18 (*)    All other components within normal limits  SEDIMENTATION RATE - Abnormal; Notable for the following components:   Sed Rate 120 (*)    All other components within normal limits  SYNOVIAL CELL COUNT + DIFF, W/ CRYSTALS - Abnormal; Notable for the following components:   Appearance-Synovial CLOUDY (*)    WBC, Synovial 16,050 (*)    Neutrophil, Synovial 93 (*)    Monocyte-Macrophage-Synovial Fluid 0 (*)    All other components within normal limits  CBG MONITORING, ED - Abnormal; Notable for the following components:   Glucose-Capillary 189 (*)    All other components within normal limits  GRAM STAIN  CULTURE, BODY FLUID-BOTTLE  SARS CORONAVIRUS 2 (TAT 6-24 HRS)  C-REACTIVE PROTEIN    EKG None  Radiology Dg Knee Complete 4 Views Right  Result Date: 11/16/2018 CLINICAL DATA:  53 year old male with right knee pain. EXAM: RIGHT KNEE - COMPLETE 4+ VIEW COMPARISON:  None. FINDINGS: There is a total right knee arthroplasty. The arthroplasty components appear intact and in anatomic alignment. No evidence of loosening. The bones are mildly osteopenic. There is no acute fracture or dislocation. There is moderate suprapatellar and joint effusion. Correlation with clinical exam is recommended to evaluate for possible infection. The there is diffuse subcutaneous edema. No radiopaque foreign object or soft tissue gas. IMPRESSION: 1. No acute fracture or dislocation. 2. Total  right knee arthroplasty appears intact and in anatomic alignment. 3. Moderate suprapatellar and joint effusion concerning for an infectious process. Clinical correlation is recommended. Electronically  Signed   By: Anner Crete M.D.   On: 11/16/2018 20:01    Procedures .Joint Aspiration/Arthrocentesis  Date/Time: 11/16/2018 9:14 PM Performed by: Davonna Belling, MD Authorized by: Davonna Belling, MD   Consent:    Consent obtained:  Verbal   Consent given by:  Parent   Risks discussed:  Bleeding, infection, pain, incomplete drainage and nerve damage   Alternatives discussed:  No treatment and alternative treatment Location:    Location:  Knee   Knee:  R knee Anesthesia (see MAR for exact dosages):    Anesthesia method:  Local infiltration   Local anesthetic:  Lidocaine 1% w/o epi Procedure details:    Preparation: Patient was prepped and draped in usual sterile fashion     Needle gauge:  18 G   Ultrasound guidance: yes     Approach:  Lateral   Aspirate amount:  85cc   Aspirate characteristics:  Cloudy   Steroid injected: no     Specimen collected: yes   Post-procedure details:    Dressing:  Adhesive bandage   Patient tolerance of procedure:  Tolerated well, no immediate complications   (including critical care time)  Medications Ordered in ED Medications  fentaNYL (SUBLIMAZE) injection 100 mcg (100 mcg Intravenous Given 11/16/18 2035)  lidocaine (PF) (XYLOCAINE) 1 % injection 5 mL (5 mLs Infiltration Given by Other 11/16/18 2111)     Initial Impression / Assessment and Plan / ED Course  I have reviewed the triage vital signs and the nursing notes.  Pertinent labs & imaging results that were available during my care of the patient were reviewed by me and considered in my medical decision making (see chart for details).        Patient with right knee pain and swelling.  Began after riding in the car but has had recent infection right foot.  Seen by Dr. Sharol Given previously and has knee replacement on that side.  Had recent amputation of right toes.  Had arthrocentesis done by myself and got 85 cc of cloudy yellow fluid.  Has some improvement in the pain. I had  discussed with Dr. Ninfa Linden from orthopedic surgery.  Suggested I do the arthrocentesis and if he requires admission transfer down to [facility] with a medicine admission for a consult to Dr. Sharol Given tomorrow.  Patient's synovial fluid analysis is back.  White count of 16,000 and over 90% neutrophils.  No organisms seen.  No crystals seen.  With a lower threshold of white counts for prosthetic knee I think this has to be treated as an infection.  Has systemic white count of 15 also an elevated sed rate.  Will admit to hospitalist for transfer to [facility].  Will start antibiotics.   Final Clinical Impressions(s) / ED Diagnoses   Final diagnoses:  Infected prosthetic knee joint, initial encounter [facility])    ED Discharge Orders    None       Davonna Belling, MD 11/16/18 2335

## 2018-11-16 NOTE — Telephone Encounter (Signed)
Per Dr. Sharol Given called pt and made an appt for Friday for painful swollen knee.

## 2018-11-16 NOTE — ED Triage Notes (Signed)
Patient reports pain in his R knee and down into his leg. Patient states his foot is also swollen. [facility] nurse concerned about possible blood clot. Patient rode in a car to [place] 2 days ago.

## 2018-11-17 ENCOUNTER — Other Ambulatory Visit: Payer: Self-pay | Admitting: Physician Assistant

## 2018-11-17 ENCOUNTER — Inpatient Hospital Stay (HOSPITAL_COMMUNITY): Payer: Medicare Other

## 2018-11-17 ENCOUNTER — Encounter (HOSPITAL_COMMUNITY): Payer: Self-pay | Admitting: Internal Medicine

## 2018-11-17 DIAGNOSIS — M009 Pyogenic arthritis, unspecified: Secondary | ICD-10-CM

## 2018-11-17 DIAGNOSIS — I471 Supraventricular tachycardia: Secondary | ICD-10-CM

## 2018-11-17 DIAGNOSIS — D649 Anemia, unspecified: Secondary | ICD-10-CM

## 2018-11-17 DIAGNOSIS — E119 Type 2 diabetes mellitus without complications: Secondary | ICD-10-CM

## 2018-11-17 DIAGNOSIS — Z96659 Presence of unspecified artificial knee joint: Secondary | ICD-10-CM

## 2018-11-17 DIAGNOSIS — T8459XA Infection and inflammatory reaction due to other internal joint prosthesis, initial encounter: Secondary | ICD-10-CM

## 2018-11-17 LAB — GLUCOSE, CAPILLARY
Glucose-Capillary: 180 mg/dL — ABNORMAL HIGH (ref 70–99)
Glucose-Capillary: 123 mg/dL — ABNORMAL HIGH (ref 70–99)
Glucose-Capillary: 113 mg/dL — ABNORMAL HIGH (ref 70–99)
Glucose-Capillary: 134 mg/dL — ABNORMAL HIGH (ref 70–99)
Glucose-Capillary: 167 mg/dL — ABNORMAL HIGH (ref 70–99)

## 2018-11-17 LAB — CBC
HCT: 29.9 % — ABNORMAL LOW (ref 39.0–52.0)
Hemoglobin: 10 g/dL — ABNORMAL LOW (ref 13.0–17.0)
MCH: 26.6 pg (ref 26.0–34.0)
MCHC: 33.4 g/dL (ref 30.0–36.0)
MCV: 79.5 fL — ABNORMAL LOW (ref 80.0–100.0)
Platelets: 325 10*3/uL (ref 150–400)
RBC: 3.76 MIL/uL — ABNORMAL LOW (ref 4.22–5.81)
RDW: 15.1 % (ref 11.5–15.5)
WBC: 13.8 10*3/uL — ABNORMAL HIGH (ref 4.0–10.5)
nRBC: 0 % (ref 0.0–0.2)

## 2018-11-17 LAB — SARS CORONAVIRUS 2 (TAT 6-24 HRS): SARS Coronavirus 2: NEGATIVE

## 2018-11-17 LAB — BASIC METABOLIC PANEL
Anion gap: 10 (ref 5–15)
BUN: 15 mg/dL (ref 6–20)
CO2: 21 mmol/L — ABNORMAL LOW (ref 22–32)
Calcium: 8.5 mg/dL — ABNORMAL LOW (ref 8.9–10.3)
Chloride: 105 mmol/L (ref 98–111)
Creatinine, Ser: 1.02 mg/dL (ref 0.61–1.24)
GFR calc Af Amer: >60 mL/min (ref >60)
GFR calc non Af Amer: >60 mL/min (ref >60)
Glucose, Bld: 118 mg/dL — ABNORMAL HIGH (ref 70–99)
Potassium: 3.1 mmol/L — ABNORMAL LOW (ref 3.5–5.1)
Sodium: 136 mmol/L (ref 135–145)

## 2018-11-17 LAB — ECHOCARDIOGRAM COMPLETE
Height: 75 in
Weight: 3040 oz

## 2018-11-17 LAB — SURGICAL PCR SCREEN
MRSA, PCR: NEGATIVE
Staphylococcus aureus: NEGATIVE

## 2018-11-17 MED ORDER — PENTOXIFYLLINE ER 400 MG PO TBCR
400.0000 mg | EXTENDED_RELEASE_TABLET | Freq: Three times a day (TID) | ORAL | Status: DC
  Administered 2018-11-17 – 2018-12-01 (×40): 400 mg via ORAL
  Filled 2018-11-17 (×46): qty 1

## 2018-11-17 MED ORDER — CHLORHEXIDINE GLUCONATE 4 % EX LIQD
60.0000 mL | Freq: Once | CUTANEOUS | Status: AC
  Administered 2018-11-18: 4 via TOPICAL

## 2018-11-17 MED ORDER — SODIUM CHLORIDE 0.9 % IV SOLN
2.0000 g | Freq: Three times a day (TID) | INTRAVENOUS | Status: DC
  Filled 2018-11-17 (×2): qty 2

## 2018-11-17 MED ORDER — VANCOMYCIN HCL IN DEXTROSE 1-5 GM/200ML-% IV SOLN
1000.0000 mg | Freq: Once | INTRAVENOUS | Status: AC
  Administered 2018-11-17: 01:00:00 1000 mg via INTRAVENOUS
  Filled 2018-11-17: qty 200

## 2018-11-17 MED ORDER — POVIDONE-IODINE 10 % EX SWAB: 2.0000 "application " | Freq: Once | CUTANEOUS | Status: DC

## 2018-11-17 MED ORDER — HYDROMORPHONE HCL 1 MG/ML IJ SOLN
0.5000 mg | INTRAMUSCULAR | Status: DC | PRN
  Administered 2018-11-17 – 2018-11-27 (×53): 0.5 mg via INTRAVENOUS
  Filled 2018-11-17 (×54): qty 1

## 2018-11-17 MED ORDER — VANCOMYCIN HCL IN DEXTROSE 1-5 GM/200ML-% IV SOLN
1000.0000 mg | Freq: Once | INTRAVENOUS | Status: AC
  Administered 2018-11-17: 02:00:00 1000 mg via INTRAVENOUS
  Filled 2018-11-17: qty 200

## 2018-11-17 MED ORDER — NICOTINE 21 MG/24HR TD PT24
21.0000 mg | MEDICATED_PATCH | Freq: Every day | TRANSDERMAL | Status: DC
  Administered 2018-11-17 – 2018-12-01 (×15): 21 mg via TRANSDERMAL
  Filled 2018-11-17 (×16): qty 1

## 2018-11-17 MED ORDER — SENNOSIDES-DOCUSATE SODIUM 8.6-50 MG PO TABS
2.0000 | ORAL_TABLET | Freq: Every day | ORAL | Status: DC
  Administered 2018-11-17 – 2018-11-30 (×11): 2 via ORAL
  Filled 2018-11-17 (×12): qty 2

## 2018-11-17 MED ORDER — VANCOMYCIN HCL 10 G IV SOLR
1250.0000 mg | Freq: Two times a day (BID) | INTRAVENOUS | Status: DC
  Filled 2018-11-17: qty 1250

## 2018-11-17 MED ORDER — POTASSIUM CHLORIDE CRYS ER 20 MEQ PO TBCR
40.0000 meq | EXTENDED_RELEASE_TABLET | ORAL | Status: AC
  Administered 2018-11-17 (×2): 40 meq via ORAL
  Filled 2018-11-17 (×2): qty 2

## 2018-11-17 MED ORDER — CEFAZOLIN SODIUM-DEXTROSE 2-4 GM/100ML-% IV SOLN
2.0000 g | Freq: Three times a day (TID) | INTRAVENOUS | Status: DC
  Administered 2018-11-17 – 2018-11-20 (×9): 2 g via INTRAVENOUS
  Filled 2018-11-17 (×11): qty 100

## 2018-11-17 MED ORDER — POLYETHYLENE GLYCOL 3350 17 G PO PACK
17.0000 g | PACK | Freq: Every day | ORAL | Status: DC | PRN
  Administered 2018-11-18: 17 g via ORAL
  Filled 2018-11-17: qty 1

## 2018-11-17 MED ORDER — OXYCODONE HCL 5 MG PO TABS: 5.0000 mg | ORAL_TABLET | Freq: Four times a day (QID) | ORAL | Status: DC | PRN

## 2018-11-17 MED ORDER — CEFAZOLIN SODIUM-DEXTROSE 2-4 GM/100ML-% IV SOLN
2.0000 g | INTRAVENOUS | Status: DC
  Filled 2018-11-17: qty 100

## 2018-11-17 MED ORDER — OXYCODONE HCL 5 MG PO TABS
5.0000 mg | ORAL_TABLET | ORAL | Status: DC | PRN
  Administered 2018-11-17 – 2018-11-19 (×9): 5 mg via ORAL
  Filled 2018-11-17 (×9): qty 1

## 2018-11-17 MED ORDER — INSULIN ASPART 100 UNIT/ML ~~LOC~~ SOLN
0.0000 [IU] | Freq: Three times a day (TID) | SUBCUTANEOUS | Status: DC
  Administered 2018-11-17 – 2018-11-18 (×3): 1 [IU] via SUBCUTANEOUS
  Administered 2018-11-18 – 2018-11-20 (×5): 2 [IU] via SUBCUTANEOUS
  Administered 2018-11-21 (×2): 1 [IU] via SUBCUTANEOUS
  Administered 2018-11-21 – 2018-11-22 (×2): 2 [IU] via SUBCUTANEOUS
  Administered 2018-11-22: 1 [IU] via SUBCUTANEOUS
  Administered 2018-11-22: 2 [IU] via SUBCUTANEOUS
  Administered 2018-11-23: 1 [IU] via SUBCUTANEOUS
  Administered 2018-11-23: 18:00:00 9 [IU] via SUBCUTANEOUS
  Administered 2018-11-24: 2 [IU] via SUBCUTANEOUS
  Administered 2018-11-24 (×2): 3 [IU] via SUBCUTANEOUS
  Administered 2018-11-25: 08:00:00 2 [IU] via SUBCUTANEOUS
  Administered 2018-11-25: 3 [IU] via SUBCUTANEOUS
  Administered 2018-11-26: 2 [IU] via SUBCUTANEOUS
  Administered 2018-11-26: 5 [IU] via SUBCUTANEOUS
  Administered 2018-11-26 – 2018-11-27 (×4): 3 [IU] via SUBCUTANEOUS
  Administered 2018-11-28: 2 [IU] via SUBCUTANEOUS
  Administered 2018-11-28: 3 [IU] via SUBCUTANEOUS
  Administered 2018-11-28: 2 [IU] via SUBCUTANEOUS
  Administered 2018-11-29: 3 [IU] via SUBCUTANEOUS
  Administered 2018-11-29 (×2): 2 [IU] via SUBCUTANEOUS
  Administered 2018-11-30: 3 [IU] via SUBCUTANEOUS
  Administered 2018-11-30: 2 [IU] via SUBCUTANEOUS
  Administered 2018-11-30 – 2018-12-01 (×2): 1 [IU] via SUBCUTANEOUS
  Administered 2018-12-01: 2 [IU] via SUBCUTANEOUS

## 2018-11-17 MED ORDER — HYDROMORPHONE HCL 1 MG/ML IJ SOLN
0.5000 mg | INTRAMUSCULAR | Status: DC | PRN
  Administered 2018-11-17 (×2): 0.5 mg via INTRAVENOUS
  Filled 2018-11-17 (×2): qty 1

## 2018-11-17 MED ORDER — ROSUVASTATIN CALCIUM 20 MG PO TABS
20.0000 mg | ORAL_TABLET | Freq: Every day | ORAL | Status: DC
  Administered 2018-11-19 – 2018-12-01 (×13): 20 mg via ORAL
  Filled 2018-11-17 (×15): qty 1

## 2018-11-17 MED ORDER — OXYCODONE HCL 5 MG PO TABS
10.0000 mg | ORAL_TABLET | Freq: Four times a day (QID) | ORAL | Status: DC | PRN
  Administered 2018-11-17: 07:00:00 10 mg via ORAL
  Filled 2018-11-17: qty 2

## 2018-11-17 NOTE — Progress Notes (Signed)
Pharmacy Antibiotic Note  [name] is a 53 y.o. male admitted on 11/16/2018 with prosthetic knee infection.  Pharmacy has been consulted for vancomycin and cefepime  dosing.  Plan: Cefepime 2 gr IV q8h   Vancomycin 2000 mg IV x1 in ED, then 1250 mg IV q12h. Monitor clinical course, renal function, cultures as available   Height: 6\' 3"  (190.5 cm) Weight: 190 lb (86.2 kg) IBW/kg (Calculated) : 84.5  Temp (24hrs), Avg:99 F (37.2 C), Min:98.1 F (36.7 C), Max:100.2 F (37.9 C)  Recent Labs  Lab 11/16/18 2038  WBC 15.8*  CREATININE 1.00    Estimated Creatinine Clearance: 99.8 mL/min (by C-G formula based on SCr of 1 mg/dL).    Allergies  Allergen Reactions  . Benadryl [Diphenhydramine Hcl] Other (See Comments)    Leg spasms   . Trazodone And Nefazodone Other (See Comments)    Leg spasms     Antimicrobials this admission: 10/28 vancomycin >>  10/28 cefepime >>   Dose adjustments this admission:    Microbiology results: 10/28 BCx:  10/28 knee aspirate culture:   Thank you for allowing pharmacy to be a part of this patient's care.   Royetta Asal, PharmD, BCPS 11/17/2018 2:06 AM

## 2018-11-17 NOTE — Consult Note (Signed)
ORTHOPAEDIC CONSULTATION  REQUESTING PHYSICIAN: Debbe Odea, MD  Chief Complaint: Acute pain and swelling right knee.  HPI: [name] is a 53 y.o. male who presents with acute pain and swelling right knee.  Patient states that he was driving for about an hour and states that he had acute onset of swelling in the knee while driving he has difficulty with weightbearing.  Patient went to [facility] he states that 85 cc of clear to slightly cloudy fluid was aspirated from his knee and patient was brought to [facility] for definitive treatment.  Patient is 6 years out from a right total knee arthroplasty with Zimmer components.  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  Past Medical History:  Diagnosis Date  . Anxiety   . Arthritis    "everywhere" (04/21/2012)  . Cellulitis and abscess of foot 12/19/2013   LEFT FOOT  . Cellulitis of right foot   . Chronic pain   . DDD (degenerative disc disease)   . Depression   . Diabetes mellitus without complication (HCC)    borderline  . Diabetic foot ulcer ([place])   . ETOH abuse   . Full dentures   . GERD (gastroesophageal reflux disease)    tums  . H/O amputation of lesser toe, right ([place]) 07/29/2017  . History of blood transfusion    "related to left knee OR; probably right hip too" (04/21/2012)  . Mental disorder   . Neuromuscular disorder (HCC)    neuropathy  . Noncompliance   . Open wound    bottom of foot  . Osteomyelitis ([place])    right 4th toe  . Osteomyelitis of right foot ([place])   . Pneumonia ~ 2012  . Polysubstance abuse (HCC)    etoh, cocaine, heroin  . Stroke [facility]) 2008   "they said I might have had one during right hip replacement" (04/21/2012)  . Wears glasses    Past Surgical History:  Procedure Laterality Date  . AMPUTATION Right 06/09/2017   Procedure: RIGHT 2ND TOE AMPUTATION;  Surgeon: [name], MD;  Location: [place];  Service: Orthopedics;  Laterality: Right;  .  AMPUTATION Right 07/23/2017   Procedure: RIGHT 4TH TOE AMPUTATION;  Surgeon: [name], MD;  Location: [place];  Service: Orthopedics;  Laterality: Right;  . AMPUTATION Right 06/15/2018   Procedure: RIGHT FOOT TRANSMETATARSAL AMPUTATION;  Surgeon: [name], MD;  Location: [place];  Service: Orthopedics;  Laterality: Right;  right  . JOINT REPLACEMENT    . KNEE ARTHROSCOPY Bilateral 1980's/1990's  . LUNG LOBECTOMY Left ~ 2006  . LUNG LOBECTOMY    . METATARSAL OSTEOTOMY  10/29/2011   Procedure: METATARSAL OSTEOTOMY;  Surgeon: [name], MD;  Location: [place];  Service: Orthopedics;  Laterality: Left;  Left 1st Metatarsal Dorsal Closing Wedge   . METATARSAL OSTEOTOMY Right 04/28/2017   Procedure: RIGHT 1ST METATARSAL DORSAL CLOSING WEDGE OSTEOTOMY;  Surgeon: [name], MD;  Location: [place];  Service: Orthopedics;  Laterality: Right;  . MULTIPLE TOOTH EXTRACTIONS    . REVISION TOTAL HIP ARTHROPLASTY Right 2008   "4-5 months after replacement" (04/21/2012)  . TOTAL HIP ARTHROPLASTY Right 2008  . TOTAL KNEE ARTHROPLASTY Left 2006  . TOTAL KNEE ARTHROPLASTY Right 04/20/2012  . TOTAL KNEE ARTHROPLASTY Right 04/20/2012   Procedure: TOTAL KNEE ARTHROPLASTY;  Surgeon: [name], MD;  Location: [place];  Service: Orthopedics;  Laterality: Right;  Right Total Knee Arthroplasty   Social  History   Socioeconomic History  . Marital status: Divorced    Spouse name: Not on file  . Number of children: Not on file  . Years of education: Not on file  . Highest education level: Not on file  Occupational History  . Not on file  Social Needs  . Financial resource strain: Not on file  . Food insecurity    Worry: Not on file    Inability: Not on file  . Transportation needs    Medical: Not on file    Non-medical: Not on file  Tobacco Use  . Smoking status: Current Every Day Smoker    Packs/day: 1.00    Years: 30.00    Pack years: 30.00    Types: Cigarettes  . Smokeless tobacco: Never Used   Substance and Sexual Activity  . Alcohol use: Not Currently    Alcohol/week: 0.0 standard drinks    Frequency: Never  . Drug use: Not Currently    Types: Cocaine    Comment: Cocaine and heroin - 10 years ago  . Sexual activity: Yes    Birth control/protection: None  Lifestyle  . Physical activity    Days per week: Not on file    Minutes per session: Not on file  . Stress: Not on file  Relationships  . Social Herbalist on phone: Not on file    Gets together: Not on file    Attends religious service: Not on file    Active member of club or organization: Not on file    Attends meetings of clubs or organizations: Not on file    Relationship status: Not on file  Other Topics Concern  . Not on file  Social History Narrative  . Not on file   Family History  Problem Relation Age of Onset  . Diabetes Father    - negative except otherwise stated in the family history section Allergies  Allergen Reactions  . Benadryl [Diphenhydramine Hcl] Other (See Comments)    Leg spasms   . Trazodone And Nefazodone Other (See Comments)    Leg spasms    Prior to Admission medications   Medication Sig Start Date End Date Taking? Authorizing Provider  metFORMIN (GLUCOPHAGE) 500 MG tablet Take 2 tablets (1,000 mg total) by mouth 2 (two) times daily with a meal. Patient taking differently: Take 500 mg by mouth 2 (two) times daily with a meal.  06/17/18  Yes Hall, Carole N, DO  nicotine (NICODERM CQ - DOSED IN MG/24 HOURS) 21 mg/24hr patch Place 1 patch (21 mg total) onto the skin daily. Patient not taking: Reported on 11/17/2018 06/17/18   Kayleen Memos, DO  nitroGLYCERIN (NITRODUR - DOSED IN MG/24 HR) 0.2 mg/hr patch Place 1 patch (0.2 mg total) onto the skin daily. Patient not taking: Reported on 11/17/2018 07/14/18   Rayburn, Neta Mends, PA-C  pentoxifylline (TRENTAL) 400 MG CR tablet Take 1 tablet (400 mg total) by mouth 3 (three) times daily with meals. Patient not taking:  Reported on 11/17/2018 07/14/18   Rayburn, Neta Mends, PA-C  polyethylene glycol (MIRALAX / GLYCOLAX) 17 g packet Take 17 g by mouth daily as needed for mild constipation. Patient not taking: Reported on 11/17/2018 06/17/18   Kayleen Memos, DO  rosuvastatin (CRESTOR) 20 MG tablet Take 1 tablet (20 mg total) by mouth daily. Patient not taking: Reported on 11/17/2018 06/17/18   Kayleen Memos, DO  senna-docusate (SENOKOT-S) 8.6-50 MG tablet Take 2 tablets by mouth  at bedtime. Patient not taking: Reported on 11/17/2018 06/17/18   Kayleen Memos, DO  silver sulfADIAZINE (SILVADENE) 1 % cream Apply 1 application topically daily. Patient not taking: Reported on 11/17/2018 11/03/18   [name], MD   Dg Knee Complete 4 Views Right  Result Date: 11/16/2018 CLINICAL DATA:  54 year old male with right knee pain. EXAM: RIGHT KNEE - COMPLETE 4+ VIEW COMPARISON:  None. FINDINGS: There is a total right knee arthroplasty. The arthroplasty components appear intact and in anatomic alignment. No evidence of loosening. The bones are mildly osteopenic. There is no acute fracture or dislocation. There is moderate suprapatellar and joint effusion. Correlation with clinical exam is recommended to evaluate for possible infection. The there is diffuse subcutaneous edema. No radiopaque foreign object or soft tissue gas. IMPRESSION: 1. No acute fracture or dislocation. 2. Total right knee arthroplasty appears intact and in anatomic alignment. 3. Moderate suprapatellar and joint effusion concerning for an infectious process. Clinical correlation is recommended. Electronically Signed   By: Anner Crete M.D.   On: 11/16/2018 20:01   - pertinent xrays, CT, MRI studies were reviewed and independently interpreted  Positive ROS: All other systems have been reviewed and were otherwise negative with the exception of those mentioned in the HPI and as above.  Physical Exam: General: Alert, no acute distress Psychiatric:  Patient is competent for consent with normal mood and affect Lymphatic: No axillary or cervical lymphadenopathy Cardiovascular: No pedal edema Respiratory: No cyanosis, no use of accessory musculature GI: No organomegaly, abdomen is soft and non-tender    Images:  @ENCIMAGES @  Labs:  Lab Results  Component Value Date   HGBA1C 6.4 (H) 10/31/2018   HGBA1C 6.2 (H) 06/13/2018   HGBA1C 6.7 (H) 06/07/2017   ESRSEDRATE 120 (H) 11/16/2018   ESRSEDRATE 30 (H) 06/13/2018   ESRSEDRATE 5 06/04/2017   CRP 36 (H) 11/16/2018   CRP 5.7 (H) 06/13/2018   CRP <0.8 06/05/2017   REPTSTATUS PENDING 11/16/2018   GRAMSTAIN  11/16/2018    ABUNDANT WBC PRESENT,BOTH PMN AND MONONUCLEAR NO ORGANISMS SEEN Performed at [facility], [address] [place], [place] [address]    Bosie Helper  11/16/2018    GRAM POSITIVE COCCI AEROBIC AND ANAEROBIC Gram Stain Report Called to,Read Back By and Verified With: HARVEY N. AT [id number] BY THOMPSON S. Performed at [facility], [address] [place], [place] [address]    CULT  11/16/2018    NO GROWTH < 12 HOURS Performed at [facility], [address], [place], [place] [address]     Lab Results  Component Value Date   ALBUMIN 3.1 (L) 11/16/2018   ALBUMIN 3.9 10/31/2018   ALBUMIN 2.9 (L) 06/15/2018    Neurologic: Patient does not have protective sensation bilateral lower extremities.   MUSCULOSKELETAL:   Skin: Examination patient does have swelling of the right knee with reaccumulation of the fluid despite aspiration of 85 cc.  There is no cellulitis his knee is tender to palpation.  Review of the radiographs shows a well-seated total knee arthroplasty with no evidence of any lytic changes around the implants no signs of osteomyelitis or instability.  Aspirate from [facility] yesterday in the emergency room is now showing gram-positive cocci on the cultures.  Patient denies any type of trauma or any acute injuries.   Assessment: Assessment: Septic right total knee arthroplasty with acute symptoms 6 years out from total knee arthroplasty with diabetic insensate neuropathy.  Plan: Plan: We will  plan for irrigation and debridement of the right knee.  Discussed that we may need to consider revision of the total knee arthroplasty in a two-stage process but will try the least invasive irrigation and debridement first since the implants appear well fixed.  Patient will need at least 6 weeks of IV antibiotics.  Will place antibiotic beads within the knee with both vancomycin and gentamicin.  Thank you for the consult and the opportunity to see Mr. Shelda Jakes, MD [facility] [phone] 6:49 PM

## 2018-11-17 NOTE — Progress Notes (Signed)
  Echocardiogram 2D Echocardiogram has been performed.  [name] 11/17/2018, 8:15 AM

## 2018-11-17 NOTE — Progress Notes (Signed)
Pharmacy Antibiotic Note  [name] is a 53 y.o. male admitted on 11/16/2018 with septic arthritis  Pharmacy has been consulted for cefazolin dosing.  Plan: Cefazolin 2gm IV q8h Monitor renal function, clinical course, and LOT   Height: 6\' 3"  (190.5 cm) Weight: 190 lb (86.2 kg) IBW/kg (Calculated) : 84.5  Temp (24hrs), Avg:98.8 F (37.1 C), Min:98.1 F (36.7 C), Max:100.2 F (37.9 C)  Recent Labs  Lab 11/16/18 2038 11/17/18 0745  WBC 15.8* 13.8*  CREATININE 1.00 1.02    Estimated Creatinine Clearance: 97.8 mL/min (by C-G formula based on SCr of 1.02 mg/dL).    Allergies  Allergen Reactions  . Benadryl [Diphenhydramine Hcl] Other (See Comments)    Leg spasms   . Trazodone And Nefazodone Other (See Comments)    Leg spasms     Antimicrobials this admission: 10/28 Vancomycin >> 10/29 10/28 Cefepime >> 10/29 10/29 Cefazolin >>   Microbiology results: 10/28 BCx: NGTD 10/28 Synovial fluid: GPC   Roselynne Lortz A. Levada Dy, PharmD, BCPS, FNKF Clinical Pharmacist [place] Please utilize Amion for appropriate phone number to reach the unit pharmacist ([place])   11/17/2018 10:04 AM

## 2018-11-17 NOTE — Progress Notes (Addendum)
PROGRESS NOTE    Gibson Lad   ZES:[id number]  DOB: 10/30/1965  DOA: 11/16/2018 PCP: Lucia Gaskins, MD   Brief Narrative:  Lautaro Koral is a 53 y/o male with DM2, CVA. 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 He drove to [place] last weekend and on the drive back he noted the swelling in his knee which extended down to his foot along with redness. He had pain as well. He did not not fevers or chills. He does not recall any injury to the knee. He made an appt with Dr Sharol Given on 10/28 for later in the week but then decided to come to the ED. In the ED, the knee was aspirated and he was stared on IV antibiotics for possible septic arthritis.   He was last admitted 10/12-10/13 when he was brought in unresponsive due to overdose of Oxycodone.    Subjective: Pain and swelling in knee are a little better but he is dependant on the IV Dilaudid as pain does become severe.    Assessment & Plan:   Principal Problem:   Suspected septic right knee joint - SIRS with tachycardia and leukocytosis - WBC 15.8, ESR 120 - 85 cc of fluid aspirated - cell count 16,050, 93% neutrophils no organisms on gram stain and no crystals - started on Vanc and Cefepime in ED - Dr Sharol Given recommends change to Ancef - Erythema appears improved - have adjusted pain medications for him> Oxycodone and Dilaudid Q 4 PRN  Active Problems:   Diabetes mellitus type 2 in nonobese  - A1c is 6.4 - Holding Metformin- cont SSI for now    Hypokalemia - replacing    Anemia, microcytic -check anemia panel  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  Ulcer on left foot plantar aspect and small healing ulcer on right foot - Dr Sharol Given following this as outpt-   Nicotine abuse - cont Nicotine patch-   Left arm numbness and tingling - has been going on since at least Sept- MRI  brain negative - received a trial of Prednisone of this at ortho office- He does not feel it is much better  Overdose on Oxycodone, Benzo and Cocaine a few wks ago He did have a prescription for Oxycodone- He had Benzodiazepine in his urine although his med rec did not show he was prescribed any  Time spent in minutes: 35 DVT prophylaxis: Lovenox Code Status: Full code Family Communication:  Disposition Plan: per orthos Consultants:   ortho Procedures:   none Antimicrobials:  Anti-infectives (From admission, onward)   Start     Dose/Rate Route Frequency Ordered Stop   11/17/18 1400  ceFAZolin (ANCEF) IVPB 2g/100 mL premix     2 g 200 mL/hr over 30 Minutes Intravenous Every 8 hours 11/17/18 1008     11/17/18 1200  vancomycin (VANCOCIN) 1,250 mg in sodium chloride 0.9 % 250 mL IVPB  Status:  Discontinued     1,250 mg 166.7 mL/hr over 90 Minutes Intravenous Every 12 hours 11/17/18 0204 11/17/18 0730   11/17/18 0800  ceFEPIme (MAXIPIME) 2 g in sodium chloride 0.9 % 100 mL IVPB  Status:  Discontinued     2 g 200 mL/hr over 30 Minutes Intravenous Every 8 hours 11/17/18 0204 11/17/18 0730   11/17/18 0115  vancomycin (VANCOCIN) IVPB 1000 mg/200 mL premix     1,000 mg 200 mL/hr over  60 Minutes Intravenous  Once 11/17/18 0000 11/17/18 0254   11/17/18 0015  vancomycin (VANCOCIN) IVPB 1000 mg/200 mL premix     1,000 mg 200 mL/hr over 60 Minutes Intravenous  Once 11/17/18 0000 11/17/18 0153   11/16/18 2330  vancomycin (VANCOCIN) 2,000 mg in sodium chloride 0.9 % 500 mL IVPB  Status:  Discontinued     2,000 mg 250 mL/hr over 120 Minutes Intravenous  Once 11/16/18 2319 11/17/18 0000   11/16/18 2330  ceFEPIme (MAXIPIME) 2 g in sodium chloride 0.9 % 100 mL IVPB     2 g 200 mL/hr over 30 Minutes Intravenous  Once 11/16/18 2319 11/17/18 0045       Objective: Vitals:   11/17/18 0155 11/17/18 0301 11/17/18 0449 11/17/18 0800  BP:  119/67 123/72 109/74  Pulse:  96 93 98  Resp:  18 17    Temp: 98.8 F (37.1 C) 98.8 F (37.1 C) 98.5 F (36.9 C) 98.1 F (36.7 C)  TempSrc:  Oral Oral Oral  SpO2:  96% 98% 98%  Weight:      Height:        Intake/Output Summary (Last 24 hours) at 11/17/2018 1411 Last data filed at 11/17/2018 0037 Gross per 24 hour  Intake -  Output 400 ml  Net -400 ml   Filed Weights   11/16/18 1730  Weight: 86.2 kg    Examination: General exam: Appears comfortable  HEENT: PERRLA, oral mucosa moist, no sclera icterus or thrush Respiratory system: Clear to auscultation. Respiratory effort normal. Cardiovascular system: S1 & S2 heard, RRR.   Gastrointestinal system: Abdomen soft, non-tender, nondistended. Normal bowel sounds. Central nervous system: Alert and oriented. No focal neurological deficits. Extremities: No cyanosis, clubbing - right leg below- no edema of left leg Skin: see below  Right leg    Left foot    Psychiatry:  Mood & affect appropriate.     Data Reviewed: I have personally reviewed following labs and imaging studies  CBC: Recent Labs  Lab 11/16/18 2038 11/17/18 0745  WBC 15.8* 13.8*  NEUTROABS 12.8*  --   HGB 11.5* 10.0*  HCT 36.4* 29.9*  MCV 82.0 79.5*  PLT 429* 295   Basic Metabolic Panel: Recent Labs  Lab 11/16/18 2038 11/17/18 0745  NA 138 136  K 3.2* 3.1*  CL 104 105  CO2 22 21*  GLUCOSE 206* 118*  BUN 20 15  CREATININE 1.00 1.02  CALCIUM 9.6 8.5*   GFR: Estimated Creatinine Clearance: 97.8 mL/min (by C-G formula based on SCr of 1.02 mg/dL). Liver Function Tests: Recent Labs  Lab 11/16/18 2038  AST 16  ALT 30  ALKPHOS 227*  BILITOT 0.6  PROT 8.2*  ALBUMIN 3.1*   No results for input(s): LIPASE, AMYLASE in the last 168 hours. No results for input(s): AMMONIA in the last 168 hours. Coagulation Profile: No results for input(s): INR, PROTIME in the last 168 hours. Cardiac Enzymes: No results for input(s): CKTOTAL, CKMB, CKMBINDEX, TROPONINI in the last 168 hours. BNP (last 3  results) No results for input(s): PROBNP in the last 8760 hours. HbA1C: No results for input(s): HGBA1C in the last 72 hours. CBG: Recent Labs  Lab 11/16/18 2042 11/16/18 2347 11/17/18 0320 11/17/18 0804 11/17/18 1155  GLUCAP 189* 169* 180* 123* 113*   Lipid Profile: No results for input(s): CHOL, HDL, LDLCALC, TRIG, CHOLHDL, LDLDIRECT in the last 72 hours. Thyroid Function Tests: No results for input(s): TSH, T4TOTAL, FREET4, T3FREE, THYROIDAB in the last 72 hours. Anemia  Panel: No results for input(s): VITAMINB12, FOLATE, FERRITIN, TIBC, IRON, RETICCTPCT in the last 72 hours. Urine analysis:    Component Value Date/Time   COLORURINE STRAW (A) 10/31/2018 1928   APPEARANCEUR CLEAR 10/31/2018 1928   LABSPEC 1.006 10/31/2018 1928   PHURINE 5.0 10/31/2018 1928   GLUCOSEU 50 (A) 10/31/2018 1928   HGBUR SMALL (A) 10/31/2018 1928   BILIRUBINUR NEGATIVE 10/31/2018 1928   KETONESUR 20 (A) 10/31/2018 1928   PROTEINUR NEGATIVE 10/31/2018 1928   UROBILINOGEN 0.2 12/18/2013 1402   NITRITE NEGATIVE 10/31/2018 1928   LEUKOCYTESUR NEGATIVE 10/31/2018 1928   Sepsis Labs: '@LABRCNTIP'$ (procalcitonin:4,lacticidven:4) ) Recent Results (from the past 240 hour(s))  Gram stain     Status: None   Collection Time: 11/16/18  9:00 PM   Specimen: Synovium; Body Fluid  Result Value Ref Range Status   Specimen Description SYNOVIAL  Final   Special Requests KNEE,JOINT  Final   Gram Stain   Final    ABUNDANT WBC PRESENT,BOTH PMN AND MONONUCLEAR NO ORGANISMS SEEN Performed at [facility], [address], [place], [place] [address]    Report Status 11/16/2018 FINAL  Final  Culture, body fluid-bottle     Status: None (Preliminary result)   Collection Time: 11/16/18  9:00 PM   Specimen: Synovium  Result Value Ref Range Status   Specimen Description SYNOVIAL  Final   Special Requests KNEE,JOINT  Final   Gram Stain   Final    GRAM POSITIVE COCCI AEROBIC AND ANAEROBIC Gram Stain Report Called  to,Read Back By and Verified With: HARVEY N. AT [place] [id number] BY THOMPSON S. Performed at [facility], [address], [place], [place] [address]    Culture PENDING  Incomplete   Report Status PENDING  Incomplete  Culture, blood (Routine X 2) w Reflex to ID Panel     Status: None (Preliminary result)   Collection Time: 11/16/18 11:16 PM   Specimen: BLOOD  Result Value Ref Range Status   Specimen Description BLOOD RIGHT ANTECUBITAL  Final   Special Requests   Final    BOTTLES DRAWN AEROBIC AND ANAEROBIC Blood Culture adequate volume   Culture   Final    NO GROWTH < 12 HOURS Performed at [facility], [address], [place], [place] [address]    Report Status PENDING  Incomplete  Culture, blood (Routine X 2) w Reflex to ID Panel     Status: None (Preliminary result)   Collection Time: 11/16/18 11:18 PM   Specimen: BLOOD RIGHT HAND  Result Value Ref Range Status   Specimen Description BLOOD RIGHT HAND  Final   Special Requests   Final    BOTTLES DRAWN AEROBIC AND ANAEROBIC Blood Culture adequate volume   Culture   Final    NO GROWTH < 12 HOURS Performed at [facility], [address], [place], [place] [address]    Report Status PENDING  Incomplete         Radiology Studies: Dg Knee Complete 4 Views Right  Result Date: 11/16/2018 CLINICAL DATA:  53 year old male with right knee pain. EXAM: RIGHT KNEE - COMPLETE 4+ VIEW COMPARISON:  None. FINDINGS: There is a total right knee arthroplasty. The arthroplasty components appear intact and in anatomic alignment. No evidence of loosening. The bones are mildly osteopenic. There is no acute fracture or dislocation. There is moderate suprapatellar and joint effusion. Correlation with clinical exam is recommended to evaluate for possible infection. The there is diffuse subcutaneous edema. No radiopaque foreign object or soft  tissue gas. IMPRESSION: 1. No acute fracture or dislocation. 2. Total right knee arthroplasty appears intact  and in anatomic alignment. 3. Moderate suprapatellar and joint effusion concerning for an infectious process. Clinical correlation is recommended. Electronically Signed   By: Anner Crete M.D.   On: 11/16/2018 20:01      Scheduled Meds: . chlorhexidine  60 mL Topical Once  . enoxaparin (LOVENOX) injection  40 mg Subcutaneous Q24H  . insulin aspart  0-9 Units Subcutaneous Q4H  . nicotine  21 mg Transdermal Daily  . pentoxifylline  400 mg Oral TID WC  . potassium chloride  40 mEq Oral Q4H  . povidone-iodine  2 application Topical Once  . rosuvastatin  20 mg Oral Daily  . senna-docusate  2 tablet Oral QHS   Continuous Infusions: .  ceFAZolin (ANCEF) IV       LOS: 1 day      Debbe Odea, MD [facility] Pager: [email or web address] Password [email or web address] 11/17/2018, 2:11 PM

## 2018-11-17 NOTE — Progress Notes (Signed)
Pt noted with temp 101.4 PRN Tylenol given at 2010 with rechecked temp 100.4. Text page on call doctor  M. Norrins with no new order.

## 2018-11-17 NOTE — Plan of Care (Signed)
  Problem: Elimination: Goal: Will not experience complications related to urinary retention Outcome: Progressing   Problem: Pain Managment: Goal: General experience of comfort will improve Outcome: Progressing   Problem: Safety: Goal: Ability to remain free from injury will improve Outcome: Progressing   

## 2018-11-17 NOTE — Plan of Care (Signed)
  Problem: Education: Goal: Knowledge of General Education information will improve Description: Including pain rating scale, medication(s)/side effects and non-pharmacologic comfort measures Outcome: Progressing   Problem: Clinical Measurements: Goal: Ability to maintain clinical measurements within normal limits will improve Outcome: Progressing   

## 2018-11-18 ENCOUNTER — Encounter (HOSPITAL_COMMUNITY)
Admission: EM | Disposition: A | Discharge status: Home Health | Payer: Self-pay | Source: Home / Self Care | Attending: Internal Medicine

## 2018-11-18 ENCOUNTER — Inpatient Hospital Stay (HOSPITAL_COMMUNITY): Payer: Medicare Other | Admitting: Certified Registered Nurse Anesthetist

## 2018-11-18 ENCOUNTER — Encounter (HOSPITAL_COMMUNITY): Payer: Self-pay | Admitting: Orthopedic Surgery

## 2018-11-18 ENCOUNTER — Ambulatory Visit: Payer: Medicare Other | Admitting: Family

## 2018-11-18 DIAGNOSIS — T8453XA Infection and inflammatory reaction due to internal right knee prosthesis, initial encounter: Secondary | ICD-10-CM | POA: Diagnosis not present

## 2018-11-18 DIAGNOSIS — R7881 Bacteremia: Secondary | ICD-10-CM | POA: Diagnosis not present

## 2018-11-18 DIAGNOSIS — E1140 Type 2 diabetes mellitus with diabetic neuropathy, unspecified: Secondary | ICD-10-CM | POA: Diagnosis not present

## 2018-11-18 DIAGNOSIS — F17210 Nicotine dependence, cigarettes, uncomplicated: Secondary | ICD-10-CM

## 2018-11-18 DIAGNOSIS — Z8631 Personal history of diabetic foot ulcer: Secondary | ICD-10-CM

## 2018-11-18 DIAGNOSIS — Z22322 Carrier or suspected carrier of Methicillin resistant Staphylococcus aureus: Secondary | ICD-10-CM

## 2018-11-18 DIAGNOSIS — Z888 Allergy status to other drugs, medicaments and biological substances status: Secondary | ICD-10-CM

## 2018-11-18 DIAGNOSIS — Z96651 Presence of right artificial knee joint: Secondary | ICD-10-CM

## 2018-11-18 DIAGNOSIS — M009 Pyogenic arthritis, unspecified: Secondary | ICD-10-CM | POA: Diagnosis not present

## 2018-11-18 DIAGNOSIS — Z89422 Acquired absence of other left toe(s): Secondary | ICD-10-CM

## 2018-11-18 HISTORY — PX: I & D EXTREMITY: SHX5045

## 2018-11-18 LAB — BASIC METABOLIC PANEL
Anion gap: 13 (ref 5–15)
BUN: 16 mg/dL (ref 6–20)
CO2: 20 mmol/L — ABNORMAL LOW (ref 22–32)
Calcium: 9.1 mg/dL (ref 8.9–10.3)
Chloride: 100 mmol/L (ref 98–111)
Creatinine, Ser: 1.06 mg/dL (ref 0.61–1.24)
GFR calc Af Amer: >60 mL/min (ref >60)
GFR calc non Af Amer: >60 mL/min (ref >60)
Glucose, Bld: 126 mg/dL — ABNORMAL HIGH (ref 70–99)
Potassium: 3.6 mmol/L (ref 3.5–5.1)
Sodium: 133 mmol/L — ABNORMAL LOW (ref 135–145)

## 2018-11-18 LAB — CBC
HCT: 30.7 % — ABNORMAL LOW (ref 39.0–52.0)
Hemoglobin: 10 g/dL — ABNORMAL LOW (ref 13.0–17.0)
MCH: 26.2 pg (ref 26.0–34.0)
MCHC: 32.6 g/dL (ref 30.0–36.0)
MCV: 80.4 fL (ref 80.0–100.0)
Platelets: 291 10*3/uL (ref 150–400)
RBC: 3.82 MIL/uL — ABNORMAL LOW (ref 4.22–5.81)
RDW: 15.1 % (ref 11.5–15.5)
WBC: 12.6 10*3/uL — ABNORMAL HIGH (ref 4.0–10.5)
nRBC: 0 % (ref 0.0–0.2)

## 2018-11-18 LAB — GLUCOSE, CAPILLARY
Glucose-Capillary: 125 mg/dL — ABNORMAL HIGH (ref 70–99)
Glucose-Capillary: 144 mg/dL — ABNORMAL HIGH (ref 70–99)
Glucose-Capillary: 147 mg/dL — ABNORMAL HIGH (ref 70–99)
Glucose-Capillary: 147 mg/dL — ABNORMAL HIGH (ref 70–99)
Glucose-Capillary: 149 mg/dL — ABNORMAL HIGH (ref 70–99)
Glucose-Capillary: 149 mg/dL — ABNORMAL HIGH (ref 70–99)
Glucose-Capillary: 166 mg/dL — ABNORMAL HIGH (ref 70–99)

## 2018-11-18 LAB — IRON AND TIBC
Iron: 12 ug/dL — ABNORMAL LOW (ref 45–182)
Saturation Ratios: 7 % — ABNORMAL LOW (ref 17.9–39.5)
TIBC: 174 ug/dL — ABNORMAL LOW (ref 250–450)
UIBC: 162 ug/dL

## 2018-11-18 LAB — FERRITIN: Ferritin: 248 ng/mL (ref 24–336)

## 2018-11-18 LAB — RETICULOCYTES
Immature Retic Fract: 12.9 % (ref 2.3–15.9)
RBC.: 3.82 MIL/uL — ABNORMAL LOW (ref 4.22–5.81)
Retic Count, Absolute: 21 10*3/uL (ref 19.0–186.0)
Retic Ct Pct: 0.6 % (ref 0.4–3.1)

## 2018-11-18 LAB — FOLATE: Folate: 9 ng/mL (ref >5.9)

## 2018-11-18 LAB — VITAMIN B12: Vitamin B-12: 225 pg/mL (ref 180–914)

## 2018-11-18 SURGERY — IRRIGATION AND DEBRIDEMENT EXTREMITY
Anesthesia: General | Site: Knee | Laterality: Right

## 2018-11-18 MED ORDER — LACTATED RINGERS IV SOLN
INTRAVENOUS | Status: DC | PRN
  Administered 2018-11-18: 09:00:00 via INTRAVENOUS

## 2018-11-18 MED ORDER — METOCLOPRAMIDE HCL 5 MG/ML IJ SOLN: 5.0000 mg | Freq: Three times a day (TID) | INTRAMUSCULAR | Status: DC | PRN

## 2018-11-18 MED ORDER — METOCLOPRAMIDE HCL 5 MG PO TABS
5.0000 mg | ORAL_TABLET | Freq: Three times a day (TID) | ORAL | Status: DC | PRN
  Administered 2018-11-27: 10 mg via ORAL
  Filled 2018-11-18: qty 2

## 2018-11-18 MED ORDER — ONDANSETRON HCL 4 MG PO TABS
4.0000 mg | ORAL_TABLET | Freq: Four times a day (QID) | ORAL | Status: DC | PRN
  Administered 2018-11-26 – 2018-11-27 (×2): 4 mg via ORAL
  Filled 2018-11-18 (×2): qty 1

## 2018-11-18 MED ORDER — FENTANYL CITRATE (PF) 250 MCG/5ML IJ SOLN
INTRAMUSCULAR | Status: AC
  Filled 2018-11-18: qty 5

## 2018-11-18 MED ORDER — ONDANSETRON HCL 4 MG/2ML IJ SOLN
INTRAMUSCULAR | Status: AC
  Filled 2018-11-18: qty 2

## 2018-11-18 MED ORDER — LIDOCAINE HCL (CARDIAC) PF 100 MG/5ML IV SOSY
PREFILLED_SYRINGE | INTRAVENOUS | Status: DC | PRN
  Administered 2018-11-18: 80 mg via INTRAVENOUS

## 2018-11-18 MED ORDER — ONDANSETRON HCL 4 MG/2ML IJ SOLN
INTRAMUSCULAR | Status: DC | PRN
  Administered 2018-11-18: 4 mg via INTRAVENOUS

## 2018-11-18 MED ORDER — LACTATED RINGERS IV SOLN
INTRAVENOUS | Status: DC
  Administered 2018-11-18: 09:00:00 via INTRAVENOUS

## 2018-11-18 MED ORDER — PROPOFOL 10 MG/ML IV BOLUS
INTRAVENOUS | Status: DC | PRN
  Administered 2018-11-18: 200 mg via INTRAVENOUS

## 2018-11-18 MED ORDER — ONDANSETRON HCL 4 MG/2ML IJ SOLN
4.0000 mg | Freq: Four times a day (QID) | INTRAMUSCULAR | Status: DC | PRN
  Administered 2018-11-27: 4 mg via INTRAVENOUS
  Filled 2018-11-18 (×2): qty 2

## 2018-11-18 MED ORDER — 0.9 % SODIUM CHLORIDE (POUR BTL) OPTIME
TOPICAL | Status: DC | PRN
  Administered 2018-11-18: 1000 mL

## 2018-11-18 MED ORDER — MIDAZOLAM HCL 2 MG/2ML IJ SOLN
INTRAMUSCULAR | Status: DC | PRN
  Administered 2018-11-18: 2 mg via INTRAVENOUS

## 2018-11-18 MED ORDER — MIDAZOLAM HCL 2 MG/2ML IJ SOLN
INTRAMUSCULAR | Status: AC
  Filled 2018-11-18: qty 2

## 2018-11-18 MED ORDER — DOCUSATE SODIUM 100 MG PO CAPS
100.0000 mg | ORAL_CAPSULE | Freq: Two times a day (BID) | ORAL | Status: DC
  Administered 2018-11-18 – 2018-12-01 (×16): 100 mg via ORAL
  Filled 2018-11-18 (×22): qty 1

## 2018-11-18 MED ORDER — PROMETHAZINE HCL 25 MG/ML IJ SOLN: 6.2500 mg | INTRAMUSCULAR | Status: DC | PRN

## 2018-11-18 MED ORDER — VITAMIN B-12 1000 MCG PO TABS
1000.0000 ug | ORAL_TABLET | Freq: Every day | ORAL | Status: DC
  Administered 2018-11-18 – 2018-12-01 (×14): 1000 ug via ORAL
  Filled 2018-11-18 (×14): qty 1

## 2018-11-18 MED ORDER — FERROUS SULFATE 325 (65 FE) MG PO TABS
325.0000 mg | ORAL_TABLET | Freq: Two times a day (BID) | ORAL | Status: DC
  Administered 2018-11-18 – 2018-12-01 (×24): 325 mg via ORAL
  Filled 2018-11-18 (×24): qty 1

## 2018-11-18 MED ORDER — FENTANYL CITRATE (PF) 100 MCG/2ML IJ SOLN
INTRAMUSCULAR | Status: DC | PRN
  Administered 2018-11-18: 50 ug via INTRAVENOUS

## 2018-11-18 MED ORDER — PHENYLEPHRINE 40 MCG/ML (10ML) SYRINGE FOR IV PUSH (FOR BLOOD PRESSURE SUPPORT)
PREFILLED_SYRINGE | INTRAVENOUS | Status: AC
  Filled 2018-11-18: qty 10

## 2018-11-18 MED ORDER — SODIUM CHLORIDE 0.9 % IR SOLN
Status: DC | PRN
  Administered 2018-11-18: 3000 mL

## 2018-11-18 MED ORDER — FENTANYL CITRATE (PF) 100 MCG/2ML IJ SOLN: 25.0000 ug | INTRAMUSCULAR | Status: DC | PRN

## 2018-11-18 SURGICAL SUPPLY — 37 items
BLADE SURG 11 STRL SS (BLADE) ×2 IMPLANT
BLADE SURG 21 STRL SS (BLADE) ×3 IMPLANT
BNDG COHESIVE 4X5 TAN STRL (GAUZE/BANDAGES/DRESSINGS) ×2 IMPLANT
BNDG COHESIVE 6X5 TAN STRL LF (GAUZE/BANDAGES/DRESSINGS) IMPLANT
BNDG GAUZE ELAST 4 BULKY (GAUZE/BANDAGES/DRESSINGS) ×4 IMPLANT
CANNULA SHOULDER 7CM (CANNULA) ×2 IMPLANT
COVER SURGICAL LIGHT HANDLE (MISCELLANEOUS) ×6 IMPLANT
COVER WAND RF STERILE (DRAPES) ×3 IMPLANT
DRAPE U-SHAPE 47X51 STRL (DRAPES) ×3 IMPLANT
DRSG ADAPTIC 3X8 NADH LF (GAUZE/BANDAGES/DRESSINGS) ×3 IMPLANT
DURAPREP 26ML APPLICATOR (WOUND CARE) ×3 IMPLANT
ELECT REM PT RETURN 9FT ADLT (ELECTROSURGICAL)
ELECTRODE REM PT RTRN 9FT ADLT (ELECTROSURGICAL) IMPLANT
EVACUATOR 1/8 PVC DRAIN (DRAIN) ×2 IMPLANT
GAUZE SPONGE 4X4 12PLY STRL (GAUZE/BANDAGES/DRESSINGS) ×3 IMPLANT
GLOVE BIOGEL PI IND STRL 9 (GLOVE) ×1 IMPLANT
GLOVE BIOGEL PI INDICATOR 9 (GLOVE) ×2
GLOVE SURG ORTHO 9.0 STRL STRW (GLOVE) ×3 IMPLANT
GOWN STRL REUS W/ TWL XL LVL3 (GOWN DISPOSABLE) ×2 IMPLANT
GOWN STRL REUS W/TWL XL LVL3 (GOWN DISPOSABLE) ×6
HANDPIECE INTERPULSE COAX TIP (DISPOSABLE)
KIT BASIN OR (CUSTOM PROCEDURE TRAY) ×3 IMPLANT
KIT TURNOVER KIT B (KITS) ×3 IMPLANT
MANIFOLD NEPTUNE II (INSTRUMENTS) ×3 IMPLANT
NS IRRIG 1000ML POUR BTL (IV SOLUTION) ×3 IMPLANT
PACK ORTHO EXTREMITY (CUSTOM PROCEDURE TRAY) ×3 IMPLANT
PAD ARMBOARD 7.5X6 YLW CONV (MISCELLANEOUS) ×6 IMPLANT
SET HNDPC FAN SPRY TIP SCT (DISPOSABLE) IMPLANT
SET IRRIG Y TYPE TUR BLADDER L (SET/KITS/TRAYS/PACK) ×2 IMPLANT
STOCKINETTE IMPERVIOUS 9X36 MD (GAUZE/BANDAGES/DRESSINGS) IMPLANT
SUT ETHILON 2 0 PSLX (SUTURE) ×2 IMPLANT
SWAB COLLECTION DEVICE MRSA (MISCELLANEOUS) ×3 IMPLANT
SWAB CULTURE ESWAB REG 1ML (MISCELLANEOUS) IMPLANT
TOWEL GREEN STERILE (TOWEL DISPOSABLE) ×3 IMPLANT
TUBE CONNECTING 12'X1/4 (SUCTIONS) ×1
TUBE CONNECTING 12X1/4 (SUCTIONS) ×2 IMPLANT
YANKAUER SUCT BULB TIP NO VENT (SUCTIONS) ×3 IMPLANT

## 2018-11-18 NOTE — Progress Notes (Signed)
PROGRESS NOTE    [name]   IPJ:[id number]  DOB: 09/02/1965  DOA: 11/16/2018 PCP: Lucia Gaskins, MD   Brief Narrative:  [name] is a 53 y/o male with DM2, CVA. 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 He drove to [place] last weekend and on the drive back he noted the swelling in his knee which extended down to his foot along with redness. He had pain as well. He did not not fevers or chills. He does not recall any injury to the knee. He made an appt with Dr Sharol Given on 10/28 for later in the week but then decided to come to the ED. In the ED, the knee was aspirated and he was stared on IV antibiotics for possible septic arthritis.   He was last admitted 10/12-10/13 when he was brought in unresponsive due to overdose of Oxycodone.    Subjective: Complains of pain and is asking to double the Oxycodone.     Assessment & Plan:   Principal Problem:   Suspected septic right knee joint - SIRS with tachycardia and leukocytosis - WBC 15.8, ESR 120 - 85 cc of fluid aspirated - cell count 16,050, 93% neutrophils no organisms on gram stain and no crystals - started on Vanc and Cefepime in ED - changed to Ancef on 10/29 - blood cultures + for GPC- ID has been consulted- will consult cardiology over the wknd for TEE on Monday - s/p removal of 100 cc of pus like material today- Dr Sharol Given plans to take back to [place] next week to remove hardware - have adjusted pain medications for him> Oxycodone Q4 hrs routine- cont Dilaudid Q 4 PRN- will need to be careful as he has overdosed in the past and he has a h/o substance abuse  Active Problems:   Diabetes mellitus type 2 in nonobese  - A1c is 6.4 - Holding Metformin- cont SSI for now    Hypokalemia - replacing    Anemia, microcytic -checked anemia panel- Iron level is low- Ferritin is normal which may be  elevated as an acute phase reactant- oral Iron started- no IV Iron due to current bacteremia- follow HB - low normal B12- 225- have started replacing  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  Ulcer on left foot plantar aspect and small healing ulcer on right foot - Dr Sharol Given following this as outpt and apparently they have been healing   Nicotine abuse - cont Nicotine patch-   Left arm numbness and tingling - has been going on since at least Sept- MRI brain negative - received a trial of Prednisone of this at ortho office- He does not feel it is much better  Overdose on Oxycodone, Benzo and Cocaine a few wks ago - UDS from 10/12 + for Benzo,Opiates, Cocaine - He did have a prescription for Oxycodone - He had Benzodiazepine in his urine although his med rec did not show he was prescribed any- He had cocaine on the UDS as well. - many UDSs have been positive for Cocaine going back to 2013  Time spent in minutes: 35 DVT prophylaxis: Lovenox Code Status: Full code Family Communication:  Disposition Plan: per ortho Consultants:   ortho Procedures:  2 D ECHO 1. Left ventricular ejection fraction, by visual estimation, is 70 to 75%. The left ventricle has hyperdynamic function. There is mildly increased left ventricular hypertrophy.  2. Indeterminate diastolic filling due to E-A fusion.  3. The left ventricle has no regional wall motion abnormalities.  4. Global right ventricle has normal systolic function.The right ventricular size is normal. No increase in right ventricular wall thickness.  5. Left atrial size was normal.  6. Right atrial size was normal.  7. Mild thickening of the anterior and posterior mitral valve leaflet(s).  8. The mitral valve is grossly normal. No evidence of mitral valve regurgitation. No evidence of mitral stenosis.  9. The tricuspid valve is grossly normal. Tricuspid valve regurgitation is not demonstrated. 10. The aortic valve is  tricuspid. Aortic valve regurgitation is not visualized. No evidence of aortic valve sclerosis or stenosis. 11. The pulmonic valve was grossly normal. Pulmonic valve regurgitation is not visualized. 12. TR signal is inadequate for assessing pulmonary artery systolic pressure. 13. The inferior vena cava is normal in size with greater than 50% respiratory variability, suggesting right atrial pressure of 3 mmHg.  Antimicrobials:  Anti-infectives (From admission, onward)   Start     Dose/Rate Route Frequency Ordered Stop   11/18/18 0600  ceFAZolin (ANCEF) IVPB 2g/100 mL premix     2 g 200 mL/hr over 30 Minutes Intravenous On call to O.R. 11/17/18 2043 11/19/18 0559   11/17/18 1400  ceFAZolin (ANCEF) IVPB 2g/100 mL premix     2 g 200 mL/hr over 30 Minutes Intravenous Every 8 hours 11/17/18 1008     11/17/18 1200  vancomycin (VANCOCIN) 1,250 mg in sodium chloride 0.9 % 250 mL IVPB  Status:  Discontinued     1,250 mg 166.7 mL/hr over 90 Minutes Intravenous Every 12 hours 11/17/18 0204 11/17/18 0730   11/17/18 0800  ceFEPIme (MAXIPIME) 2 g in sodium chloride 0.9 % 100 mL IVPB  Status:  Discontinued     2 g 200 mL/hr over 30 Minutes Intravenous Every 8 hours 11/17/18 0204 11/17/18 0730   11/17/18 0115  vancomycin (VANCOCIN) IVPB 1000 mg/200 mL premix     1,000 mg 200 mL/hr over 60 Minutes Intravenous  Once 11/17/18 0000 11/17/18 0254   11/17/18 0015  vancomycin (VANCOCIN) IVPB 1000 mg/200 mL premix     1,000 mg 200 mL/hr over 60 Minutes Intravenous  Once 11/17/18 0000 11/17/18 0153   11/16/18 2330  vancomycin (VANCOCIN) 2,000 mg in sodium chloride 0.9 % 500 mL IVPB  Status:  Discontinued     2,000 mg 250 mL/hr over 120 Minutes Intravenous  Once 11/16/18 2319 11/17/18 0000   11/16/18 2330  ceFEPIme (MAXIPIME) 2 g in sodium chloride 0.9 % 100 mL IVPB     2 g 200 mL/hr over 30 Minutes Intravenous  Once 11/16/18 2319 11/17/18 0045       Objective: Vitals:   11/17/18 2241 11/18/18 0256  11/18/18 0500 11/18/18 0526  BP:    132/76  Pulse:    98  Resp:    18  Temp: 98.6 F (37 C) 99.1 F (37.3 C)  (!) 101.2 F (38.4 C)  TempSrc: Oral Oral  Oral  SpO2:    99%  Weight:   82.4 kg   Height:        Intake/Output Summary (Last 24 hours) at 11/18/2018 0746 Last data filed at 11/18/2018 0542 Gross per 24 hour  Intake 1475 ml  Output 2650 ml  Net -1175 ml   Filed Weights   11/16/18 1730 11/18/18 0500  Weight: 86.2 kg 82.4 kg    Examination: General exam: Appears comfortable  HEENT: PERRLA, oral mucosa  moist, no sclera icterus or thrush Respiratory system: Clear to auscultation. Respiratory effort normal. Cardiovascular system: S1 & S2 heard,  No murmurs  Gastrointestinal system: Abdomen soft, non-tender, nondistended. Normal bowel sounds   Central nervous system: Alert and oriented. No focal neurological deficits. Extremities: No cyanosis, clubbing - left knee in dressing with drain present Skin: No rashes or ulcers Psychiatry:  Mood & affect appropriate.   Left foot    Psychiatry:  Mood & affect appropriate.     Data Reviewed: I have personally reviewed following labs and imaging studies  CBC: Recent Labs  Lab 11/16/18 2038 11/17/18 0745 11/18/18 0315  WBC 15.8* 13.8* 12.6*  NEUTROABS 12.8*  --   --   HGB 11.5* 10.0* 10.0*  HCT 36.4* 29.9* 30.7*  MCV 82.0 79.5* 80.4  PLT 429* 325 579   Basic Metabolic Panel: Recent Labs  Lab 11/16/18 2038 11/17/18 0745 11/18/18 0315  NA 138 136 133*  K 3.2* 3.1* 3.6  CL 104 105 100  CO2 22 21* 20*  GLUCOSE 206* 118* 126*  BUN _0 CREATININE 1.00 1.02 1.06  CALCIUM 9.6 8.5* PENDING   GFR: Estimated Creatinine Clearance: 91.8 mL/min (by C-G formula based on SCr of 1.06 mg/dL). Liver Function Tests: Recent Labs  Lab 11/16/18 2038  AST 16  ALT 30  ALKPHOS 227*  BILITOT 0.6  PROT 8.2*  ALBUMIN 3.1*   No results for input(s): LIPASE, AMYLASE in the last 168 hours. No results for input(s):  AMMONIA in the last 168 hours. Coagulation Profile: No results for input(s): INR, PROTIME in the last 168 hours. Cardiac Enzymes: No results for input(s): CKTOTAL, CKMB, CKMBINDEX, TROPONINI in the last 168 hours. BNP (last 3 results) No results for input(s): PROBNP in the last 8760 hours. HbA1C: No results for input(s): HGBA1C in the last 72 hours. CBG: Recent Labs  Lab 11/17/18 1155 11/17/18 1620 11/17/18 1941 11/17/18 2359 11/18/18 0521  GLUCAP 113* 134* 167* 149* 149*   Lipid Profile: No results for input(s): CHOL, HDL, LDLCALC, TRIG, CHOLHDL, LDLDIRECT in the last 72 hours. Thyroid Function Tests: No results for input(s): TSH, T4TOTAL, FREET4, T3FREE, THYROIDAB in the last 72 hours. Anemia Panel: Recent Labs    11/18/18 0315  VITAMINB12 225  FOLATE 9.0  FERRITIN 248  TIBC 174*  IRON 12*  RETICCTPCT 0.6   Urine analysis:    Component Value Date/Time   COLORURINE STRAW (A) 10/31/2018 1928   APPEARANCEUR CLEAR 10/31/2018 1928   LABSPEC 1.006 10/31/2018 1928   PHURINE 5.0 10/31/2018 1928   GLUCOSEU 50 (A) 10/31/2018 1928   HGBUR SMALL (A) 10/31/2018 1928   BILIRUBINUR NEGATIVE 10/31/2018 1928   KETONESUR 20 (A) 10/31/2018 1928   PROTEINUR NEGATIVE 10/31/2018 1928   UROBILINOGEN 0.2 12/18/2013 1402   NITRITE NEGATIVE 10/31/2018 1928   LEUKOCYTESUR NEGATIVE 10/31/2018 1928   Sepsis Labs: _1 (procalcitonin:4,lacticidven:4) ) Recent Results (from the past 240 hour(s))  Gram stain     Status: None   Collection Time: 11/16/18  9:00 PM   Specimen: Synovium; Body Fluid  Result Value Ref Range Status   Specimen Description SYNOVIAL  Final   Special Requests KNEE,JOINT  Final   Gram Stain   Final    ABUNDANT WBC PRESENT,BOTH PMN AND MONONUCLEAR NO ORGANISMS SEEN Performed at [facility], [address], [place], [place] [address]    Report Status 11/16/2018 FINAL  Final  Culture, body fluid-bottle     Status: None (Preliminary result)   Collection  Time: 11/16/18  9:00 PM   Specimen: Synovium  Result Value Ref Range Status   Specimen Description SYNOVIAL  Final   Special Requests KNEE,JOINT  Final   Gram Stain   Final    GRAM POSITIVE COCCI AEROBIC AND ANAEROBIC Gram Stain Report Called to,Read Back By and Verified With: HARVEY N. AT [place] [id number] BY THOMPSON S.    Culture   Final    NO GROWTH 2 DAYS Performed at [facility], [address], [place], [place] [address]    Report Status PENDING  Incomplete  SARS CORONAVIRUS 2 (TAT 6-24 HRS) Nasopharyngeal Nasopharyngeal Swab     Status: None   Collection Time: 11/16/18 10:10 PM   Specimen: Nasopharyngeal Swab  Result Value Ref Range Status   SARS Coronavirus 2 NEGATIVE NEGATIVE Final    Comment: (NOTE) SARS-CoV-2 target nucleic acids are NOT DETECTED. The SARS-CoV-2 RNA is generally detectable in upper and lower respiratory specimens during the acute phase of infection. Negative results do not preclude SARS-CoV-2 infection, do not rule out co-infections with other pathogens, and should not be used as the sole basis for treatment or other patient management decisions. Negative results must be combined with clinical observations, patient history, and epidemiological information. The expected result is Negative. Fact Sheet for Patients: SugarRoll.be Fact Sheet for Healthcare Providers: https://www.woods-mathews.com/ This test is not yet approved or cleared by the [place] FDA and  has been authorized for detection and/or diagnosis of SARS-CoV-2 by FDA under an Emergency Use Authorization (EUA). This EUA will remain  in effect (meaning this test can be used) for the duration of the COVID-19 declaration under Section 56 4(b)(1) of the Act, 21 U.S.C. section 360bbb-3(b)(1), unless the authorization is terminated or revoked sooner. Performed at [facility], [place] [address], [place], [place] [address]   Culture, blood  (Routine X 2) w Reflex to ID Panel     Status: None (Preliminary result)   Collection Time: 11/16/18 11:16 PM   Specimen: BLOOD  Result Value Ref Range Status   Specimen Description BLOOD RIGHT ANTECUBITAL  Final   Special Requests   Final    BOTTLES DRAWN AEROBIC AND ANAEROBIC Blood Culture adequate volume   Culture  Setup Time   Final    AEROBIC BOTTLE ONLY GRAM POSITIVE COCCI Gram Stain Report Called to,Read Back By and Verified With: K DULIL,RN _0  11/18/18 MKELLY    Culture   Final    NO GROWTH 2 DAYS Performed at [facility], [address] [place], [place] [address]    Report Status PENDING  Incomplete  Culture, blood (Routine X 2) w Reflex to ID Panel     Status: None (Preliminary result)   Collection Time: 11/16/18 11:18 PM   Specimen: BLOOD RIGHT HAND  Result Value Ref Range Status   Specimen Description BLOOD RIGHT HAND  Final   Special Requests   Final    BOTTLES DRAWN AEROBIC AND ANAEROBIC Blood Culture adequate volume   Culture   Final    NO GROWTH 2 DAYS Performed at [facility], [address] [place], [place] [address]    Report Status PENDING  Incomplete  Surgical pcr screen     Status: None   Collection Time: 11/17/18  2:59 PM   Specimen: Nasal Mucosa; Nasal Swab  Result Value Ref Range Status   MRSA, PCR NEGATIVE NEGATIVE Final   Staphylococcus aureus NEGATIVE NEGATIVE Final    Comment: (NOTE) The Xpert SA Assay (FDA  approved for NASAL specimens in patients 55 years of age and older), is one component of a comprehensive surveillance program. It is not intended to diagnose infection nor to guide or monitor treatment. Performed at [facility], [place] [address], [place], [place] [address]          Radiology Studies: Dg Knee Complete 4 Views Right  Result Date: 11/16/2018 CLINICAL DATA:  53 year old male with right knee pain. EXAM: RIGHT KNEE - COMPLETE 4+ VIEW COMPARISON:  None. FINDINGS: There is a total right knee arthroplasty. The  arthroplasty components appear intact and in anatomic alignment. No evidence of loosening. The bones are mildly osteopenic. There is no acute fracture or dislocation. There is moderate suprapatellar and joint effusion. Correlation with clinical exam is recommended to evaluate for possible infection. The there is diffuse subcutaneous edema. No radiopaque foreign object or soft tissue gas. IMPRESSION: 1. No acute fracture or dislocation. 2. Total right knee arthroplasty appears intact and in anatomic alignment. 3. Moderate suprapatellar and joint effusion concerning for an infectious process. Clinical correlation is recommended. Electronically Signed   By: Anner Crete M.D.   On: 11/16/2018 20:01      Scheduled Meds: . enoxaparin (LOVENOX) injection  40 mg Subcutaneous Q24H  . insulin aspart  0-9 Units Subcutaneous TID WC  . nicotine  21 mg Transdermal Daily  . pentoxifylline  400 mg Oral TID WC  . povidone-iodine  2 application Topical Once  . rosuvastatin  20 mg Oral Daily  . senna-docusate  2 tablet Oral QHS   Continuous Infusions: .  ceFAZolin (ANCEF) IV 2 g (11/18/18 0542)  .  ceFAZolin (ANCEF) IV       LOS: 2 days      Debbe Odea, MD [facility] Pager: [email or web address] Password [email or web address] 11/18/2018, 7:46 AM

## 2018-11-18 NOTE — Transfer of Care (Signed)
Immediate Anesthesia Transfer of Care Note  Patient: [name]  Procedure(s) Performed: RIGHT KNEE DEBRIDEMENT (Right Knee)  Patient Location: PACU  Anesthesia Type:General  Level of Consciousness: awake, alert  and oriented  Airway & Oxygen Therapy: Patient Spontanous Breathing and Patient connected to nasal cannula oxygen  Post-op Assessment: Report given to RN and Post -op Vital signs reviewed and stable  Post vital signs: Reviewed and stable  Last Vitals:  Vitals Value Taken Time  BP 124/59 11/18/18 1028  Temp    Pulse 94 11/18/18 1029  Resp 26 11/18/18 1029  SpO2 99 % 11/18/18 1029  Vitals shown include unvalidated device data.  Last Pain:  Vitals:   11/18/18 0608  TempSrc:   PainSc: 4       Patients Stated Pain Goal: 4 ([id number])  Complications: No apparent anesthesia complications

## 2018-11-18 NOTE — Progress Notes (Addendum)
PROGRESS NOTE    [name]   OQH:[id number]  DOB: [birth year]  DOA: 11/16/2018 PCP: Lucia Gaskins, MD   Brief Narrative:  [name] is a 53 y/o male with DM2, CVA. 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 He drove to [place] last weekend and on the drive back he noted the swelling in his knee which extended down to his foot along with redness. He had pain as well. He did not not fevers or chills. He does not recall any injury to the knee. He made an appt with Dr Sharol Given on 10/28 for later in the week but then decided to come to the ED. In the ED, the knee was aspirated and he was stared on IV antibiotics for possible septic arthritis.   He was last admitted 10/12-10/13 when he was brought in unresponsive due to overdose of Oxycodone.    Subjective: S/p OR. He has no complaints.     Assessment & Plan:   Principal Problem:   Suspected septic right knee joint - SIRS with tachycardia and leukocytosis - WBC 15.8, ESR 120 - 85 cc of fluid aspirated - cell count 16,050, 93% neutrophils no organisms on gram stain and no crystals - started on Vanc and Cefepime in ED - changed to Ancef on 10/29 - cont pain control> Oxycodone and Dilaudid Q 4 PRN - blood cultures + for GPC- ID has been consulted- will consult cardiology over the wknd for TEE on Monday - s/p removal of 100 cc of pus like material today- Dr Sharol Given plans to take back to OR next week to remove hardware  Active Problems:   Diabetes mellitus type 2 in nonobese  - A1c is 6.4 - Holding Metformin- cont SSI for now    Hypokalemia - replaced    Anemia, microcytic -check anemia panel- Iron saturation low- Ferritin normal but this may be an acute phase reactant- start oral Iron replacement B12 low normal- start B12 replacement as well  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   Ulcer on left foot plantar aspect and small healing ulcer on right foot - Dr Sharol Given following this as outpt-   Nicotine abuse - cont Nicotine patch-   Left arm numbness and tingling - has been going on since at least Sept- MRI brain negative - received a trial of Prednisone of this at ortho office- He does not feel it is much better- apparently there is a plan for an outpt EMG  Overdose on Oxycodone, Benzo and Cocaine a few wks ago He did have a prescription for Oxycodone- He had Benzodiazepine in his urine although his med rec did not show he was prescribed any  Time spent in minutes: 35 DVT prophylaxis: Lovenox Code Status: Full code Family Communication:  Disposition Plan: per ortho Consultants:   ortho Procedures:  2 D ECHO 1. Left ventricular ejection fraction, by visual estimation, is 70 to 75%. The left ventricle has hyperdynamic function. There is mildly increased left ventricular hypertrophy.  2. Indeterminate diastolic filling due to E-A fusion.  3. The left ventricle has no regional wall motion abnormalities.  4. Global right ventricle has normal systolic function.The right ventricular size is normal. No increase in right ventricular wall thickness.  5. Left atrial size was normal.  6. Right atrial size was normal.  7. Mild thickening of the anterior and posterior mitral valve leaflet(s).  8. The mitral valve is grossly normal. No evidence of mitral valve regurgitation. No evidence of mitral stenosis.  9. The tricuspid valve is grossly normal. Tricuspid valve regurgitation is not demonstrated. 10. The aortic valve is tricuspid. Aortic valve regurgitation is not visualized. No evidence of aortic valve sclerosis or stenosis. 11. The pulmonic valve was grossly normal. Pulmonic valve regurgitation is not visualized. 12. TR signal is inadequate for assessing pulmonary artery systolic pressure. 13. The inferior vena cava is normal in size with greater than 50% respiratory  variability, suggesting right atrial pressure of 3 mmHg.  Antimicrobials:  Anti-infectives (From admission, onward)   Start     Dose/Rate Route Frequency Ordered Stop   11/18/18 0600  ceFAZolin (ANCEF) IVPB 2g/100 mL premix  Status:  Discontinued     2 g 200 mL/hr over 30 Minutes Intravenous On call to O.R. 11/17/18 2043 11/18/18 1052   11/17/18 1400  ceFAZolin (ANCEF) IVPB 2g/100 mL premix     2 g 200 mL/hr over 30 Minutes Intravenous Every 8 hours 11/17/18 1008     11/17/18 1200  vancomycin (VANCOCIN) 1,250 mg in sodium chloride 0.9 % 250 mL IVPB  Status:  Discontinued     1,250 mg 166.7 mL/hr over 90 Minutes Intravenous Every 12 hours 11/17/18 0204 11/17/18 0730   11/17/18 0800  ceFEPIme (MAXIPIME) 2 g in sodium chloride 0.9 % 100 mL IVPB  Status:  Discontinued     2 g 200 mL/hr over 30 Minutes Intravenous Every 8 hours 11/17/18 0204 11/17/18 0730   11/17/18 0115  vancomycin (VANCOCIN) IVPB 1000 mg/200 mL premix     1,000 mg 200 mL/hr over 60 Minutes Intravenous  Once 11/17/18 0000 11/17/18 0254   11/17/18 0015  vancomycin (VANCOCIN) IVPB 1000 mg/200 mL premix     1,000 mg 200 mL/hr over 60 Minutes Intravenous  Once 11/17/18 0000 11/17/18 0153   11/16/18 2330  vancomycin (VANCOCIN) 2,000 mg in sodium chloride 0.9 % 500 mL IVPB  Status:  Discontinued     2,000 mg 250 mL/hr over 120 Minutes Intravenous  Once 11/16/18 2319 11/17/18 0000   11/16/18 2330  ceFEPIme (MAXIPIME) 2 g in sodium chloride 0.9 % 100 mL IVPB     2 g 200 mL/hr over 30 Minutes Intravenous  Once 11/16/18 2319 11/17/18 0045       Objective: Vitals:   11/18/18 0903 11/18/18 1029 11/18/18 1048 11/18/18 1456  BP:   131/76 114/73  Pulse:   91 88  Resp:   18 18  Temp:  97.6 F (36.4 C) 98.1 F (36.7 C) 98.3 F (36.8 C)  TempSrc:   Oral Oral  SpO2:   100% 100%  Weight: 82.4 kg     Height: 6' 3" (1.905 m)       Intake/Output Summary (Last 24 hours) at 11/18/2018 1547 Last data filed at 11/18/2018 1500  Gross per 24 hour  Intake 1775 ml  Output 2555 ml  Net -780 ml   Filed Weights   11/16/18 1730 11/18/18 0500 11/18/18 0903  Weight: 86.2 kg 82.4 kg 82.4 kg    Examination: General exam: Appears comfortable  HEENT: PERRLA, oral mucosa moist, no sclera icterus or thrush Respiratory system: Clear to auscultation. Respiratory effort normal. Cardiovascular system: S1 & S2 heard,  No murmurs  Gastrointestinal system: Abdomen soft, non-tender, nondistended. Normal bowel sounds   Central nervous system: Alert and oriented. No focal neurological deficits. Extremities: No cyanosis, clubbing - right foot edematous- dressing on right knee  with drain present Skin: No rashes or ulcers Psychiatry:  Mood & affect appropriate.   Left foot    Psychiatry:  Mood & affect appropriate.     Data Reviewed: I have personally reviewed following labs and imaging studies  CBC: Recent Labs  Lab 11/16/18 2038 11/17/18 0745 11/18/18 0315  WBC 15.8* 13.8* 12.6*  NEUTROABS 12.8*  --   --   HGB 11.5* 10.0* 10.0*  HCT 36.4* 29.9* 30.7*  MCV 82.0 79.5* 80.4  PLT 429* 325 034   Basic Metabolic Panel: Recent Labs  Lab 11/16/18 2038 11/17/18 0745 11/18/18 0315  NA 138 136 133*  K 3.2* 3.1* 3.6  CL 104 105 100  CO2 22 21* 20*  GLUCOSE 206* 118* 126*  BUN _0 CREATININE 1.00 1.02 1.06  CALCIUM 9.6 8.5* 9.1   GFR: Estimated Creatinine Clearance: 91.8 mL/min (by C-G formula based on SCr of 1.06 mg/dL). Liver Function Tests: Recent Labs  Lab 11/16/18 2038  AST 16  ALT 30  ALKPHOS 227*  BILITOT 0.6  PROT 8.2*  ALBUMIN 3.1*   No results for input(s): LIPASE, AMYLASE in the last 168 hours. No results for input(s): AMMONIA in the last 168 hours. Coagulation Profile: No results for input(s): INR, PROTIME in the last 168 hours. Cardiac Enzymes: No results for input(s): CKTOTAL, CKMB, CKMBINDEX, TROPONINI in the last 168 hours. BNP (last 3 results) No results for input(s): PROBNP  in the last 8760 hours. HbA1C: No results for input(s): HGBA1C in the last 72 hours. CBG: Recent Labs  Lab 11/17/18 1941 11/17/18 2359 11/18/18 0521 11/18/18 0802 11/18/18 1158  GLUCAP 167* 149* 149* 144* 125*   Lipid Profile: No results for input(s): CHOL, HDL, LDLCALC, TRIG, CHOLHDL, LDLDIRECT in the last 72 hours. Thyroid Function Tests: No results for input(s): TSH, T4TOTAL, FREET4, T3FREE, THYROIDAB in the last 72 hours. Anemia Panel: Recent Labs    11/18/18 0315  VITAMINB12 225  FOLATE 9.0  FERRITIN 248  TIBC 174*  IRON 12*  RETICCTPCT 0.6   Urine analysis:    Component Value Date/Time   COLORURINE STRAW (A) 10/31/2018 1928   APPEARANCEUR CLEAR 10/31/2018 1928   LABSPEC 1.006 10/31/2018 1928   PHURINE 5.0 10/31/2018 1928   GLUCOSEU 50 (A) 10/31/2018 1928   HGBUR SMALL (A) 10/31/2018 1928   BILIRUBINUR NEGATIVE 10/31/2018 1928   KETONESUR 20 (A) 10/31/2018 1928   PROTEINUR NEGATIVE 10/31/2018 1928   UROBILINOGEN 0.2 12/18/2013 1402   NITRITE NEGATIVE 10/31/2018 1928   LEUKOCYTESUR NEGATIVE 10/31/2018 1928   Sepsis Labs: _1 (procalcitonin:4,lacticidven:4) ) Recent Results (from the past 240 hour(s))  Gram stain     Status: None   Collection Time: 11/16/18  9:00 PM   Specimen: Synovium; Body Fluid  Result Value Ref Range Status   Specimen Description SYNOVIAL  Final   Special Requests KNEE,JOINT  Final   Gram Stain   Final    ABUNDANT WBC PRESENT,BOTH PMN AND MONONUCLEAR NO ORGANISMS SEEN Performed at [facility], [address], [place], [place] [address]    Report Status 11/16/2018 FINAL  Final  Culture, body fluid-bottle     Status: None (Preliminary result)   Collection Time: 11/16/18  9:00 PM   Specimen: Synovium  Result Value Ref Range Status   Specimen Description SYNOVIAL  Final   Special Requests KNEE,JOINT  Final   Gram Stain   Final    GRAM POSITIVE COCCI AEROBIC AND ANAEROBIC Gram Stain Report Called to,Read Back By and  Verified  With: HARVEY N. AT [place] [id number] BY THOMPSON S.    Culture   Final    NO GROWTH 2 DAYS Performed at [facility], [address], [place], [place] [address]    Report Status PENDING  Incomplete  SARS CORONAVIRUS 2 (TAT 6-24 HRS) Nasopharyngeal Nasopharyngeal Swab     Status: None   Collection Time: 11/16/18 10:10 PM   Specimen: Nasopharyngeal Swab  Result Value Ref Range Status   SARS Coronavirus 2 NEGATIVE NEGATIVE Final    Comment: (NOTE) SARS-CoV-2 target nucleic acids are NOT DETECTED. The SARS-CoV-2 RNA is generally detectable in upper and lower respiratory specimens during the acute phase of infection. Negative results do not preclude SARS-CoV-2 infection, do not rule out co-infections with other pathogens, and should not be used as the sole basis for treatment or other patient management decisions. Negative results must be combined with clinical observations, patient history, and epidemiological information. The expected result is Negative. Fact Sheet for Patients: SugarRoll.be Fact Sheet for Healthcare Providers: https://www.woods-mathews.com/ This test is not yet approved or cleared by the [place] FDA and  has been authorized for detection and/or diagnosis of SARS-CoV-2 by FDA under an Emergency Use Authorization (EUA). This EUA will remain  in effect (meaning this test can be used) for the duration of the COVID-19 declaration under Section 56 4(b)(1) of the Act, 21 U.S.C. section 360bbb-3(b)(1), unless the authorization is terminated or revoked sooner. Performed at [facility], [place] [address], [place], [place] [address]   Culture, blood (Routine X 2) w Reflex to ID Panel     Status: None (Preliminary result)   Collection Time: 11/16/18 11:16 PM   Specimen: BLOOD  Result Value Ref Range Status   Specimen Description   Final    BLOOD RIGHT ANTECUBITAL Performed at [facility], [address]  [place], [place] [address]    Special Requests   Final    BOTTLES DRAWN AEROBIC AND ANAEROBIC Blood Culture adequate volume Performed at [facility], [address], [place], [place] [address]    Culture  Setup Time   Final    BOTH AEROBIC AND ANAEROBIC GRAM POSITIVE COCCI Gram Stain Report Called to,Read Back By and Verified With: K DULIL,RN _0  11/18/18 [facility] Performed at [facility], [address], [place], [place] [address]    Culture [facility] POSITIVE COCCI  Final   Report Status PENDING  Incomplete  Culture, blood (Routine X 2) w Reflex to ID Panel     Status: None (Preliminary result)   Collection Time: 11/16/18 11:18 PM   Specimen: BLOOD RIGHT HAND  Result Value Ref Range Status   Specimen Description BLOOD RIGHT HAND  Final   Special Requests   Final    BOTTLES DRAWN AEROBIC AND ANAEROBIC Blood Culture adequate volume   Culture   Final    NO GROWTH 2 DAYS Performed at [facility], [address] [place], [place] [address]    Report Status PENDING  Incomplete  Surgical pcr screen     Status: None   Collection Time: 11/17/18  2:59 PM   Specimen: Nasal Mucosa; Nasal Swab  Result Value Ref Range Status   MRSA, PCR NEGATIVE NEGATIVE Final   Staphylococcus aureus NEGATIVE NEGATIVE Final    Comment: (NOTE) The Xpert SA Assay (FDA approved for NASAL specimens in patients 78 years of age and older), is one component of a comprehensive surveillance program. It is not intended to diagnose infection nor to guide or  monitor treatment. Performed at [facility], [place] [address], [place],  [address]          Radiology Studies: Dg Knee Complete 4 Views Right  Result Date: 11/16/2018 CLINICAL DATA:  53 year old male with right knee pain. EXAM: RIGHT KNEE - COMPLETE 4+ VIEW COMPARISON:  None. FINDINGS: There is a total right knee arthroplasty. The arthroplasty components appear intact and in anatomic alignment. No evidence of loosening. The bones are mildly osteopenic.  There is no acute fracture or dislocation. There is moderate suprapatellar and joint effusion. Correlation with clinical exam is recommended to evaluate for possible infection. The there is diffuse subcutaneous edema. No radiopaque foreign object or soft tissue gas. IMPRESSION: 1. No acute fracture or dislocation. 2. Total right knee arthroplasty appears intact and in anatomic alignment. 3. Moderate suprapatellar and joint effusion concerning for an infectious process. Clinical correlation is recommended. Electronically Signed   By: Anner Crete M.D.   On: 11/16/2018 20:01      Scheduled Meds: . docusate sodium  100 mg Oral BID  . enoxaparin (LOVENOX) injection  40 mg Subcutaneous Q24H  . insulin aspart  0-9 Units Subcutaneous TID WC  . nicotine  21 mg Transdermal Daily  . pentoxifylline  400 mg Oral TID WC  . rosuvastatin  20 mg Oral Daily  . senna-docusate  2 tablet Oral QHS   Continuous Infusions: .  ceFAZolin (ANCEF) IV 2 g (11/18/18 1340)     LOS: 2 days      Debbe Odea, MD [facility] Pager: [email or web address] Password [email or web address] 11/18/2018, 3:47 PM

## 2018-11-18 NOTE — Consult Note (Signed)
[facility]ious [facility]  Total days of antibiotics 2         Reason for Consult: pji    Referring Physician: duda  Principal Problem:   Septic joint of right knee joint ([place]) Active Problems:   Diabetes mellitus type 2 in nonobese ([place])   Hypokalemia   Anemia   Infection of total knee replacement (HCC)   Infected prosthetic knee joint, initial encounter [facility])    HPI: Benno Shangraw is a 53 y.o. 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 he initially went to [facility] for joint aspiration  On 10/28 which showed 16K cells with 93%N, the cx were no growth to date. Blood cx also ngtd. He stated it improved initially. He does report having fever, chills, rigors in the meantime. Over the next 48rhs, fluid continued to reaccumulate, thus he had failed conservative measures. He had redness and swelling to dorsum of TM area of RIGHT foot. No scrapes or trauma to RIGHT leg. SInce his TM amputation of right foot, it still had some residual swelling. It is warm to touch and swollen, slightly erythematous on exam today. He last took bactrim roughly 1 month ago. He does have small dfu to plantar aspect of left feet but does not appear infected. The patient was admitted for surgical management. Dr Sharol Given took him to OR on 10/30 for arthroscopic debridement - with purulence roughly 191mL sent for culture. Infectious work up also shows that he has [place] on blood cx. He is MRSA colonized. Dr duda's Plan is to take back to OR early next week for 2 staged revision.  He hasn't voted yet and wondered how to do that  Past Medical History:  Diagnosis Date  . Anxiety   . Arthritis    "everywhere" (04/21/2012)  . Cellulitis and abscess of foot 12/19/2013   LEFT FOOT  . Cellulitis of right foot   . Chronic pain   . DDD  (degenerative disc disease)   . Depression   . Diabetes mellitus without complication (HCC)    borderline  . Diabetic foot ulcer ([place])   . ETOH abuse   . Full dentures   . GERD (gastroesophageal reflux disease)    tums  . H/O amputation of lesser toe, right ([place]) 07/29/2017  . History of blood transfusion    "related to left knee OR; probably right hip too" (04/21/2012)  . Mental disorder   . Neuromuscular disorder (HCC)    neuropathy  . Noncompliance   . Open wound    bottom of foot  . Osteomyelitis ([place])    right 4th toe  . Osteomyelitis of right foot ([place])   . Pneumonia ~ 2012  . Polysubstance abuse (HCC)    etoh, cocaine, heroin  . Stroke [facility]) 2008   "they said I might have had one during right hip replacement" (04/21/2012)  . Wears glasses     Allergies:  Allergies  Allergen Reactions  . Benadryl [Diphenhydramine Hcl] Other (See Comments)    Leg spasms   . Trazodone And Nefazodone Other (See Comments)    Leg spasms     MEDICATIONS: . docusate sodium  100 mg Oral BID  . enoxaparin (LOVENOX) injection  40 mg Subcutaneous Q24H  . insulin aspart  0-9 Units  Subcutaneous TID WC  . nicotine  21 mg Transdermal Daily  . pentoxifylline  400 mg Oral TID WC  . rosuvastatin  20 mg Oral Daily  . senna-docusate  2 tablet Oral QHS    Social History   Tobacco Use  . Smoking status: Current Every Day Smoker    Packs/day: 1.00    Years: 30.00    Pack years: 30.00    Types: Cigarettes  . Smokeless tobacco: Never Used  Substance Use Topics  . Alcohol use: Not Currently    Alcohol/week: 0.0 standard drinks    Frequency: Never  . Drug use: Not Currently    Types: Cocaine    Comment: Cocaine and heroin - 10 years ago    Family History  Problem Relation Age of Onset  . Diabetes Father      Review of Systems  Constitutional: + for fever, chills, diaphoresis, activity change, appetite change, fatigue and unexpected weight change.  HENT: Negative for congestion, sore  throat, rhinorrhea, sneezing, trouble swallowing and sinus pressure.  Eyes: Negative for photophobia and visual disturbance.  Respiratory: Negative for cough, chest tightness, shortness of breath, wheezing and stridor.  Cardiovascular: Negative for chest pain, palpitations and leg swelling.  Gastrointestinal: Negative for nausea, vomiting, abdominal pain, diarrhea, constipation, blood in stool, abdominal distention and anal bleeding.  Genitourinary: Negative for dysuria, hematuria, flank pain and difficulty urinating.  Musculoskeletal: right knee + Skin: Negative for color change, pallor, rash and wound.  Neurological: Negative for dizziness, tremors, weakness and light-headedness.  Hematological: Negative for adenopathy. Does not bruise/bleed easily.  Psychiatric/Behavioral: Negative for behavioral problems, confusion, sleep disturbance, dysphoric mood, decreased concentration and agitation.     OBJECTIVE: Temp:  [97.6 F (36.4 C)-101.4 F (38.6 C)] 98.1 F (36.7 C) (10/30 1048) Pulse Rate:  [91-100] 91 (10/30 1048) Resp:  [18] 18 (10/30 1048) BP: (124-153)/(74-90) 131/76 (10/30 1048) SpO2:  [98 %-100 %] 100 % (10/30 1048) Weight:  [82.4 kg] 82.4 kg (10/30 0903) Physical Exam  Constitutional: He is oriented to person, place, and time. He appears well-developed and well-nourished. No distress.  HENT:  Mouth/Throat: Oropharynx is clear and moist. No oropharyngeal exudate.  Cardiovascular: Normal rate, regular rhythm and normal heart sounds. Exam reveals no gallop and no friction rub.  No murmur heard.  Pulmonary/Chest: Effort normal and breath sounds normal. No respiratory distress. He has no wheezes.  Abdominal: Soft. Bowel sounds are normal. He exhibits no distension. There is no tenderness.  Lymphadenopathy:  He has no cervical adenopathy.  Neurological: He is alert and oriented to person, place, and time. Decreased sensation to feet Ext: right knee is wrapped with accordian  drain/wound vac in place. Right TM is warm to touch small shallow ulcer with trace edema. Left foot has large plantar eschar no surrounding erythema Skin: Skin is warm and dry. No rash noted. No erythema.  Psychiatric: He has a normal mood and affect. His behavior is normal.     LABS: Results for orders placed or performed during the hospital encounter of 11/16/18 (from the past 48 hour(s))  Comprehensive metabolic panel     Status: Abnormal   Collection Time: 11/16/18  8:38 PM  Result Value Ref Range   Sodium 138 135 - 145 mmol/L   Potassium 3.2 (L) 3.5 - 5.1 mmol/L   Chloride 104 98 - 111 mmol/L   CO2 22 22 - 32 mmol/L   Glucose, Bld 206 (H) 70 - 99 mg/dL   BUN 20 6 - 20 mg/dL  Creatinine, Ser 1.00 0.61 - 1.24 mg/dL   Calcium 9.6 8.9 - 10.3 mg/dL   Total Protein 8.2 (H) 6.5 - 8.1 g/dL   Albumin 3.1 (L) 3.5 - 5.0 g/dL   AST 16 15 - 41 U/L   ALT 30 0 - 44 U/L   Alkaline Phosphatase 227 (H) 38 - 126 U/L   Total Bilirubin 0.6 0.3 - 1.2 mg/dL   GFR calc non Af Amer >60 >60 mL/min   GFR calc Af Amer >60 >60 mL/min   Anion gap 12 5 - 15    Comment: Performed at [facility], [address], [place], [place] [address]  CBC with Differential     Status: Abnormal   Collection Time: 11/16/18  8:38 PM  Result Value Ref Range   WBC 15.8 (H) 4.0 - 10.5 K/uL   RBC 4.44 4.22 - 5.81 MIL/uL   Hemoglobin 11.5 (L) 13.0 - 17.0 g/dL   HCT 36.4 (L) 39.0 - 52.0 %   MCV 82.0 80.0 - 100.0 fL   MCH 25.9 (L) 26.0 - 34.0 pg   MCHC 31.6 30.0 - 36.0 g/dL   RDW 15.3 11.5 - 15.5 %   Platelets 429 (H) 150 - 400 K/uL   nRBC 0.0 0.0 - 0.2 %   Neutrophils Relative % 81 %   Neutro Abs 12.8 (H) 1.7 - 7.7 K/uL   Lymphocytes Relative 11 %   Lymphs Abs 1.7 0.7 - 4.0 K/uL   Monocytes Relative 7 %   Monocytes Absolute 1.1 (H) 0.1 - 1.0 K/uL   Eosinophils Relative 0 %   Eosinophils Absolute 0.1 0.0 - 0.5 K/uL   Basophils Relative 0 %   Basophils Absolute 0.1 0.0 - 0.1 K/uL   Immature Granulocytes 1 %   Abs  Immature Granulocytes 0.18 (H) 0.00 - 0.07 K/uL    Comment: Performed at [facility], [address] [place], [place] [address]  Sedimentation rate     Status: Abnormal   Collection Time: 11/16/18  8:38 PM  Result Value Ref Range   Sed Rate 120 (H) 0 - 16 mm/hr    Comment: Performed at [facility], [address] [place], [place] [address]  C-reactive protein     Status: Abnormal   Collection Time: 11/16/18  8:38 PM  Result Value Ref Range   CRP 36 (H) <1.0 mg/dL    Comment: RESULTS CONFIRMED BY MANUAL DILUTION Performed at [facility], [address], [place], [place] [address]   CBG monitoring, ED     Status: Abnormal   Collection Time: 11/16/18  8:42 PM  Result Value Ref Range   Glucose-Capillary 189 (H) 70 - 99 mg/dL  Gram stain     Status: None   Collection Time: 11/16/18  9:00 PM   Specimen: Synovium; Body Fluid  Result Value Ref Range   Specimen Description SYNOVIAL    Special Requests KNEE,JOINT    Gram Stain      ABUNDANT WBC PRESENT,BOTH PMN AND MONONUCLEAR NO ORGANISMS SEEN Performed at [facility], [address], [place],  [address]    Report Status 11/16/2018 FINAL   Culture, body fluid-bottle     Status: None (Preliminary result)   Collection Time: 11/16/18  9:00 PM   Specimen: Synovium  Result Value Ref Range   Specimen Description SYNOVIAL    Special Requests KNEE,JOINT    Gram Stain      GRAM POSITIVE COCCI AEROBIC AND ANAEROBIC Gram Stain Report Called to,Read Back By and  Verified With: HARVEY N. AT [facility] [id number] BY THOMPSON S.    Culture      NO GROWTH 2 DAYS Performed at [facility], [address], [place], [place] [address]    Report Status PENDING   Synovial cell count + diff, w/ crystals     Status: Abnormal   Collection Time: 11/16/18  9:05 PM  Result Value Ref Range   Color, Synovial YELLOW YELLOW   Appearance-Synovial CLOUDY (A) CLEAR   Crystals, Fluid NO CRYSTALS SEEN    WBC, Synovial 16,050 (H) 0 - 200 /cu mm    Neutrophil, Synovial 93 (H) 0 - 25 %   Lymphocytes-Synovial Fld 7 0 - 20 %   Monocyte-Macrophage-Synovial Fluid 0 (L) 50 - 90 %   Eosinophils-Synovial 0 0 - 1 %   Other Cells-SYN 0     Comment: Performed at [facility], [address], [place], [place] [address]  SARS CORONAVIRUS 2 (TAT 6-24 HRS) Nasopharyngeal Nasopharyngeal Swab     Status: None   Collection Time: 11/16/18 10:10 PM   Specimen: Nasopharyngeal Swab  Result Value Ref Range   SARS Coronavirus 2 NEGATIVE NEGATIVE    Comment: (NOTE) SARS-CoV-2 target nucleic acids are NOT DETECTED. The SARS-CoV-2 RNA is generally detectable in upper and lower respiratory specimens during the acute phase of infection. Negative results do not preclude SARS-CoV-2 infection, do not rule out co-infections with other pathogens, and should not be used as the sole basis for treatment or other patient management decisions. Negative results must be combined with clinical observations, patient history, and epidemiological information. The expected result is Negative. Fact Sheet for Patients: SugarRoll.be Fact Sheet for Healthcare Providers: https://www.woods-mathews.com/ This test is not yet approved or cleared by the [place] FDA and  has been authorized for detection and/or diagnosis of SARS-CoV-2 by FDA under an Emergency Use Authorization (EUA). This EUA will remain  in effect (meaning this test can be used) for the duration of the COVID-19 declaration under Section 56 4(b)(1) of the Act, 21 U.S.C. section 360bbb-3(b)(1), unless the authorization is terminated or revoked sooner. Performed at [facility], [place] [address], [place], [place] [address]   Culture, blood (Routine X 2) w Reflex to ID Panel     Status: None (Preliminary result)   Collection Time: 11/16/18 11:16 PM   Specimen: BLOOD  Result Value Ref Range   Specimen Description      BLOOD RIGHT ANTECUBITAL Performed at [facility], [address] [place], [place] [address]    Special Requests      BOTTLES DRAWN AEROBIC AND ANAEROBIC Blood Culture adequate volume Performed at [facility], [address] [place], [place] [address]    Culture  Setup Time      BOTH AEROBIC AND ANAEROBIC GRAM POSITIVE COCCI Gram Stain Report Called to,Read Back By and Verified With: K DULIL,RN @0400  11/18/18 [facility] Performed at [facility], [address] [place],  [address]    Culture GRAM POSITIVE COCCI    Report Status PENDING   Culture, blood (Routine X 2) w Reflex to ID Panel     Status: None (Preliminary result)   Collection Time: 11/16/18 11:18 PM   Specimen: BLOOD RIGHT HAND  Result Value Ref Range   Specimen Description BLOOD RIGHT HAND    Special Requests      BOTTLES DRAWN AEROBIC AND ANAEROBIC Blood Culture adequate volume   Culture      NO GROWTH 2 DAYS Performed  at [facility], [address], [place], [place] [address]    Report Status PENDING   CBG monitoring, ED     Status: Abnormal   Collection Time: 11/16/18 11:47 PM  Result Value Ref Range   Glucose-Capillary 169 (H) 70 - 99 mg/dL  Glucose, capillary     Status: Abnormal   Collection Time: 11/17/18  3:20 AM  Result Value Ref Range   Glucose-Capillary 180 (H) 70 - 99 mg/dL  Basic metabolic panel     Status: Abnormal   Collection Time: 11/17/18  7:45 AM  Result Value Ref Range   Sodium 136 135 - 145 mmol/L   Potassium 3.1 (L) 3.5 - 5.1 mmol/L   Chloride 105 98 - 111 mmol/L   CO2 21 (L) 22 - 32 mmol/L   Glucose, Bld 118 (H) 70 - 99 mg/dL   BUN 15 6 - 20 mg/dL   Creatinine, Ser 1.02 0.61 - 1.24 mg/dL   Calcium 8.5 (L) 8.9 - 10.3 mg/dL   GFR calc non Af Amer >60 >60 mL/min   GFR calc Af Amer >60 >60 mL/min   Anion gap 10 5 - 15    Comment: Performed at [facility], [place] [address] [place], [place] [address]  CBC     Status: Abnormal   Collection Time: 11/17/18  7:45 AM  Result Value Ref Range   WBC 13.8 (H) 4.0 - 10.5 K/uL    RBC 3.76 (L) 4.22 - 5.81 MIL/uL   Hemoglobin 10.0 (L) 13.0 - 17.0 g/dL   HCT 29.9 (L) 39.0 - 52.0 %   MCV 79.5 (L) 80.0 - 100.0 fL   MCH 26.6 26.0 - 34.0 pg   MCHC 33.4 30.0 - 36.0 g/dL   RDW 15.1 11.5 - 15.5 %   Platelets 325 150 - 400 K/uL   nRBC 0.0 0.0 - 0.2 %    Comment: Performed at [facility], [place] [address] [place], [place] [address]  Glucose, capillary     Status: Abnormal   Collection Time: 11/17/18  8:04 AM  Result Value Ref Range   Glucose-Capillary 123 (H) 70 - 99 mg/dL  Glucose, capillary     Status: Abnormal   Collection Time: 11/17/18 11:55 AM  Result Value Ref Range   Glucose-Capillary 113 (H) 70 - 99 mg/dL  Surgical pcr screen     Status: None   Collection Time: 11/17/18  2:59 PM   Specimen: Nasal Mucosa; Nasal Swab  Result Value Ref Range   MRSA, PCR NEGATIVE NEGATIVE   Staphylococcus aureus NEGATIVE NEGATIVE    Comment: (NOTE) The Xpert SA Assay (FDA approved for NASAL specimens in patients 40 years of age and older), is one component of a comprehensive surveillance program. It is not intended to diagnose infection nor to guide or monitor treatment. Performed at [facility], [place] [address] [place], [place] [address]   Glucose, capillary     Status: Abnormal   Collection Time: 11/17/18  4:20 PM  Result Value Ref Range   Glucose-Capillary 134 (H) 70 - 99 mg/dL  Glucose, capillary     Status: Abnormal   Collection Time: 11/17/18  7:41 PM  Result Value Ref Range   Glucose-Capillary 167 (H) 70 - 99 mg/dL  Glucose, capillary     Status: Abnormal   Collection Time: 11/17/18 11:59 PM  Result Value Ref Range   Glucose-Capillary 149 (H) 70 - 99 mg/dL  Vitamin B12     Status:  None   Collection Time: 11/18/18  3:15 AM  Result Value Ref Range   Vitamin B-12 225 180 - 914 pg/mL    Comment: (NOTE) This assay is not validated for testing neonatal or myeloproliferative syndrome specimens for Vitamin B12 levels. Performed at [facility], [place] [address], [place], [place] [address]   Folate     Status: None   Collection Time: 11/18/18  3:15 AM  Result Value Ref Range   Folate 9.0 >5.9 ng/mL    Comment: Performed at [facility], [place] [address] [place], [place] [address]  Iron and TIBC     Status: Abnormal   Collection Time: 11/18/18  3:15 AM  Result Value Ref Range   Iron 12 (L) 45 - 182 ug/dL   TIBC 174 (L) 250 - 450 ug/dL   Saturation Ratios 7 (L) 17.9 - 39.5 %   UIBC 162 ug/dL    Comment: Performed at [facility], [place] [address], [place], [place] [address]  Ferritin     Status: None   Collection Time: 11/18/18  3:15 AM  Result Value Ref Range   Ferritin 248 24 - 336 ng/mL    Comment: Performed at [facility] [address], [place], [place] [address]  Reticulocytes     Status: Abnormal   Collection Time: 11/18/18  3:15 AM  Result Value Ref Range   Retic Ct Pct 0.6 0.4 - 3.1 %   RBC. 3.82 (L) 4.22 - 5.81 MIL/uL   Retic Count, Absolute 21.0 19.0 - 186.0 K/uL   Immature Retic Fract 12.9 2.3 - 15.9 %    Comment: Performed at [facility] [address], [place], [place] [address]  CBC     Status: Abnormal   Collection Time: 11/18/18  3:15 AM  Result Value Ref Range   WBC 12.6 (H) 4.0 - 10.5 K/uL   RBC 3.82 (L) 4.22 - 5.81 MIL/uL   Hemoglobin 10.0 (L) 13.0 - 17.0 g/dL   HCT 30.7 (L) 39.0 - 52.0 %   MCV 80.4 80.0 - 100.0 fL   MCH 26.2 26.0 - 34.0 pg   MCHC 32.6 30.0 - 36.0 g/dL   RDW 15.1 11.5 - 15.5 %   Platelets 291 150 - 400 K/uL   nRBC 0.0 0.0 - 0.2 %    Comment: Performed at [facility], [place] [address], [place], [place] [id number]  Basic metabolic panel     Status: Abnormal   Collection Time: 11/18/18  3:15 AM  Result Value Ref Range   Sodium 133 (L) 135 - 145 mmol/L   Potassium 3.6 3.5 - 5.1 mmol/L   Chloride 100 98 - 111 mmol/L   CO2 20 (L) 22 - 32 mmol/L   Glucose, Bld 126 (H) 70 - 99 mg/dL   BUN 16 6 - 20 mg/dL   Creatinine, Ser 1.06 0.61 - 1.24 mg/dL    Calcium 9.1 8.9 - 10.3 mg/dL   GFR calc non Af Amer >60 >60 mL/min   GFR calc Af Amer >60 >60 mL/min   Anion gap 13 5 - 15    Comment: Performed at [facility], [place] [address], [place], [place] [address]  Glucose, capillary     Status: Abnormal   Collection Time: 11/18/18  5:21 AM  Result Value Ref Range   Glucose-Capillary 149 (H) 70 - 99 mg/dL  Glucose, capillary     Status: Abnormal  Collection Time: 11/18/18  8:02 AM  Result Value Ref Range   Glucose-Capillary 144 (H) 70 - 99 mg/dL  Glucose, capillary     Status: Abnormal   Collection Time: 11/18/18 11:58 AM  Result Value Ref Range   Glucose-Capillary 125 (H) 70 - 99 mg/dL    MICRO: 10/28 blood cx showing GPC 10/28 or fluid- GPC IMAGING: Dg Knee Complete 4 Views Right  Result Date: 11/16/2018 CLINICAL DATA:  53 year old male with right knee pain. EXAM: RIGHT KNEE - COMPLETE 4+ VIEW COMPARISON:  None. FINDINGS: There is a total right knee arthroplasty. The arthroplasty components appear intact and in anatomic alignment. No evidence of loosening. The bones are mildly osteopenic. There is no acute fracture or dislocation. There is moderate suprapatellar and joint effusion. Correlation with clinical exam is recommended to evaluate for possible infection. The there is diffuse subcutaneous edema. No radiopaque foreign object or soft tissue gas. IMPRESSION: 1. No acute fracture or dislocation. 2. Total right knee arthroplasty appears intact and in anatomic alignment. 3. Moderate suprapatellar and joint effusion concerning for an infectious process. Clinical correlation is recommended. Electronically Signed   By: Anner Crete M.D.   On: 11/16/2018 20:01   Assessment/Plan:  53yo M with right knee PJI, source unclear but also has secondary gram positive bacteremia. On physical exam, concerned that his right foot TM site is warm to touch and possibly source of infection. Suspect it maybe MRSA though it is odd that it is taking long  to grow on culture  - continue on vancomycin plus cefazolin - await identification of blood cx and joint fluid - agree with 2 staged revision  For gram postive bacteremia - need to evaluated for endocarditis - repeat blood cx tomorrow - please get TTE   For source of his infection: - recommend xray of right TM and consider MRI to see if any source of abscess that needs to be drained  Dr Linus Salmons to see on Monday. Dr Megan Salon available for questions

## 2018-11-18 NOTE — Op Note (Signed)
11/18/2018  10:34 AM  PATIENT:  [name]    PRE-OPERATIVE DIAGNOSIS:  Septic right total knee arthroplasty  POST-OPERATIVE DIAGNOSIS:  Same  PROCEDURE: Arthroscopic RIGHT KNEE DEBRIDEMENT Cultures sent of cloudy synovial fluid.  SURGEON:  [name], MD  PHYSICIAN ASSISTANT:None ANESTHESIA:   General  PREOPERATIVE INDICATIONS:  [name] is a  53 y.o. male with a diagnosis of Effusion Right Knee who failed conservative measures and elected for surgical management.    The risks benefits and alternatives were discussed with the patient preoperatively including but not limited to the risks of infection, bleeding, nerve injury, cardiopulmonary complications, the need for revision surgery, among others, and the patient was willing to proceed.  OPERATIVE IMPLANTS: Hemovac drain  @ENCIMAGES @  OPERATIVE FINDINGS: Patient had purulent fluid in the right knee.  Cultures were sent of the fluid  OPERATIVE PROCEDURE: Patient was brought the operating room underwent a general anesthetic.  After adequate levels anesthesia were obtained patient's right lower extremity was prepped using DuraPrep draped into a sterile field a timeout was called.  A cannula was inserted through the superior lateral portal for inflow and a drainage portal was inserted anterior medially.  Patient had approximately 100 cc of effusion material was cloudy and purulent.  This was irrigated with 3 L normal saline a Hemovac drain was placed through the superior lateral portal.  Sterile dressing was applied patient was extubated taken the PACU in stable condition.   DISCHARGE PLANNING:  Antibiotic duration: Patient will need long-term antibiotics  Weightbearing: Weightbearing as tolerated  Pain medication: Opioid pathway  Dressing care/ Wound VAC: Hemovac drain charged for as needed  Ambulatory devices: Walker  Discharge to: Anticipate return to the operating room this next week for removal of the total knee  arthroplasty and placement of an anatomic antibiotic spacer.  Will consult infectious disease for long-term antibiotic therapy.  Patient's total knee replacement is 53 years old with radiographs showing no lucency around the implants.  Follow-up: In the office 1 week post operative.

## 2018-11-18 NOTE — Anesthesia Postprocedure Evaluation (Signed)
Anesthesia Post Note  Patient: [name]  Procedure(s) Performed: RIGHT KNEE DEBRIDEMENT (Right Knee)     Patient location during evaluation: PACU Anesthesia Type: General Level of consciousness: awake Pain management: pain level controlled Vital Signs Assessment: post-procedure vital signs reviewed and stable Respiratory status: spontaneous breathing Cardiovascular status: stable Postop Assessment: no apparent nausea or vomiting Anesthetic complications: no    Last Vitals:  Vitals:   11/18/18 1029 11/18/18 1048  BP:  131/76  Pulse:  91  Resp:  18  Temp: 36.4 C 36.7 C  SpO2:  100%    Last Pain:  Vitals:   11/18/18 1145  TempSrc:   PainSc: 6                  Anayelli Lai

## 2018-11-18 NOTE — Anesthesia Procedure Notes (Signed)
Procedure Name: LMA Insertion Date/Time: 11/18/2018 9:56 AM Performed by: Inda Coke, CRNA Pre-anesthesia Checklist: Patient identified, Emergency Drugs available, Suction available and Patient being monitored Patient Re-evaluated:Patient Re-evaluated prior to induction Oxygen Delivery Method: Circle System Utilized Preoxygenation: Pre-oxygenation with 100% oxygen Induction Type: IV induction Ventilation: Mask ventilation without difficulty LMA: LMA inserted LMA Size: 4.0 Number of attempts: 1 Airway Equipment and Method: Bite block Placement Confirmation: positive ETCO2 Tube secured with: Tape Dental Injury: Teeth and Oropharynx as per pre-operative assessment

## 2018-11-18 NOTE — Anesthesia Preprocedure Evaluation (Addendum)
Anesthesia Evaluation  Patient identified by MRN, date of birth, ID band Patient awake    Reviewed: Allergy & Precautions, NPO status , Patient's Chart, lab work & pertinent test results  History of Anesthesia Complications Negative for: history of anesthetic complications  Airway Mallampati: II  TM Distance: >3 FB Neck ROM: Full    Dental  (+) Edentulous Lower, Edentulous Upper   Pulmonary Current Smoker and Patient abstained from smoking.,    Pulmonary exam normal        Cardiovascular negative cardio ROS Normal cardiovascular exam     Neuro/Psych PSYCHIATRIC DISORDERS Anxiety Depression CVA, No Residual Symptoms    GI/Hepatic GERD  Controlled,(+)     substance abuse  alcohol use, cocaine use and IV drug use,   Endo/Other  diabetes, Type 2, Oral Hypoglycemic Agents  Renal/GU negative Renal ROS     Musculoskeletal  (+) Arthritis ,   Abdominal   Peds  Hematology  (+) anemia ,   Anesthesia Other Findings   Reproductive/Obstetrics                            Anesthesia Physical Anesthesia Plan  ASA: III  Anesthesia Plan: General   Post-op Pain Management:    Induction: Intravenous  PONV Risk Score and Plan: 2 and Treatment may vary due to age or medical condition, Ondansetron and Midazolam  Airway Management Planned: LMA  Additional Equipment: None  Intra-op Plan:   Post-operative Plan: Extubation in OR  Informed Consent: I have reviewed the patients History and Physical, chart, labs and discussed the procedure including the risks, benefits and alternatives for the proposed anesthesia with the patient or authorized representative who has indicated his/her understanding and acceptance.     Dental advisory given  Plan Discussed with: CRNA and Anesthesiologist  Anesthesia Plan Comments:        Anesthesia Quick Evaluation

## 2018-11-18 NOTE — Progress Notes (Signed)
Received a call from lab blood culture result--aerobic bottle gram positive cocci. Text paged on call MD M. Norrins.

## 2018-11-18 NOTE — TOC Initial Note (Signed)
Transition of Care [facility]) - Initial/Assessment Note    Patient Details  Name: [name] MRN: [id number] Date of Birth: August 03, 1965  Transition of [facility]) CM/SW Contact:    Marilu Favre, RN Phone Number: 11/18/2018, 1:37 PM  Clinical Narrative:                 Confirmed face sheet information. Patient from home with son and [place]. Patient has been at home before with IV ABX. Patient has walker and 3 in1 at home already. Requested orders. Per note patient staying for more surgery next week. [place] [facility] and Pam following.  Expected Discharge Plan: [place] Barriers to Discharge: Continued Medical Work up   Patient Goals and CMS Choice Patient states their goals for this hospitalization and ongoing recovery are:: to go home CMS Medicare.gov Compare Post Acute Care list provided to:: Patient Choice offered to / list presented to : Patient  Expected Discharge Plan and Services Expected Discharge Plan: [place]   Discharge Planning Services: CM Consult Post Acute Care Choice: [place] arrangements for the past 2 months: Single Family Home                 DME Arranged: N/A         HH Arranged: RN, PT [place] Agency: [place] ([place]) Date [place]: 11/18/18 Time HH Agency Contacted: [phone] Representative spoke with at [place]: [place] Arrangements/Services Living arrangements for the past 2 months: [place] Lives with:: Adult Children Patient language and need for interpreter reviewed:: Yes Do you feel safe going back to the place where you live?: Yes      Need for Family Participation in Patient Care: Yes (Comment) Care giver support system in place?: Yes (comment) Current home services: Home PT Criminal Activity/Legal Involvement Pertinent to Current Situation/Hospitalization: No - Comment as needed  Activities of Daily Living      Permission  Sought/Granted   Permission granted to share information with : Yes, Verbal Permission Granted     Permission granted to share info w AGENCY: [place]        Emotional Assessment Appearance:: Appears stated age Attitude/Demeanor/Rapport: Engaged Affect (typically observed): Accepting Orientation: : Oriented to Self, Oriented to Place, Oriented to  Time, Oriented to Situation Alcohol / Substance Use: Not Applicable Psych Involvement: No (comment)  Admission diagnosis:  Infected prosthetic knee joint, initial encounter ([place]) [T84.59XA, Z96.659] Patient Active Problem List   Diagnosis Date Noted  . Infection of total knee replacement ([place])   . Infected prosthetic knee joint, initial encounter ([place])   . Septic joint of right knee joint ([place]) 11/16/2018  . Hypokalemia 11/16/2018  . Anemia 11/16/2018  . Acute encephalopathy 10/31/2018  . Accidental drug overdose 10/31/2018  . Left arm weakness 10/31/2018  . History of transmetatarsal amputation of right foot ([place]) 08/30/2018  . Charcot foot due to diabetes mellitus ([place])   . Cellulitis 06/12/2018  . Diabetic foot infection ([place]) 06/22/2017  . Post op infection 06/22/2017  . SVT (supraventricular tachycardia) ([place]) 08/17/2016  . Tobacco use disorder 10/25/2014  . Diabetes mellitus type 2 in nonobese (HCC)   . Swelling   . Type 2 diabetes mellitus with left diabetic foot ulcer ([place]) 12/18/2013  . Diabetes mellitus due to underlying condition with foot ulcer ([place]) 12/18/2013  . Left leg cellulitis 12/18/2013  . Cocaine abuse ([place]) 03/29/2012  . Generalized anxiety  disorder 03/29/2012  . Panic attacks 03/29/2012  . Alcohol dependence ([place]) 08/05/2011    Class: Chronic  . Substance abuse ([place]) 05/31/2011  . Ulcer of left foot, limited to breakdown of skin ([place]) 02/15/2011   PCP:  Lucia Gaskins, MD Pharmacy:   [facility], [place] AT [address] [place] [address] Phone: [phone] Fax: [phone]     Social Determinants of Health (SDOH) Interventions    Readmission Risk Interventions No flowsheet data found.

## 2018-11-19 ENCOUNTER — Encounter (HOSPITAL_COMMUNITY): Payer: Self-pay | Admitting: Orthopedic Surgery

## 2018-11-19 DIAGNOSIS — D509 Iron deficiency anemia, unspecified: Secondary | ICD-10-CM

## 2018-11-19 DIAGNOSIS — R7881 Bacteremia: Secondary | ICD-10-CM | POA: Diagnosis present

## 2018-11-19 DIAGNOSIS — A419 Sepsis, unspecified organism: Secondary | ICD-10-CM

## 2018-11-19 LAB — BASIC METABOLIC PANEL
Anion gap: 13 (ref 5–15)
BUN: 16 mg/dL (ref 6–20)
CO2: 21 mmol/L — ABNORMAL LOW (ref 22–32)
Calcium: 8.9 mg/dL (ref 8.9–10.3)
Chloride: 99 mmol/L (ref 98–111)
Creatinine, Ser: 1 mg/dL (ref 0.61–1.24)
GFR calc Af Amer: >60 mL/min (ref >60)
GFR calc non Af Amer: >60 mL/min (ref >60)
Glucose, Bld: 153 mg/dL — ABNORMAL HIGH (ref 70–99)
Potassium: 3.9 mmol/L (ref 3.5–5.1)
Sodium: 133 mmol/L — ABNORMAL LOW (ref 135–145)

## 2018-11-19 LAB — ACID FAST SMEAR (AFB, MYCOBACTERIA): Acid Fast Smear: NEGATIVE

## 2018-11-19 LAB — CBC
HCT: 29.2 % — ABNORMAL LOW (ref 39.0–52.0)
Hemoglobin: 9.6 g/dL — ABNORMAL LOW (ref 13.0–17.0)
MCH: 26 pg (ref 26.0–34.0)
MCHC: 32.9 g/dL (ref 30.0–36.0)
MCV: 79.1 fL — ABNORMAL LOW (ref 80.0–100.0)
Platelets: 284 K/uL (ref 150–400)
RBC: 3.69 MIL/uL — ABNORMAL LOW (ref 4.22–5.81)
RDW: 14.9 % (ref 11.5–15.5)
WBC: 11.4 K/uL — ABNORMAL HIGH (ref 4.0–10.5)
nRBC: 0 % (ref 0.0–0.2)

## 2018-11-19 LAB — GLUCOSE, CAPILLARY
Glucose-Capillary: 110 mg/dL — ABNORMAL HIGH (ref 70–99)
Glucose-Capillary: 127 mg/dL — ABNORMAL HIGH (ref 70–99)
Glucose-Capillary: 127 mg/dL — ABNORMAL HIGH (ref 70–99)
Glucose-Capillary: 132 mg/dL — ABNORMAL HIGH (ref 70–99)
Glucose-Capillary: 186 mg/dL — ABNORMAL HIGH (ref 70–99)
Glucose-Capillary: 219 mg/dL — ABNORMAL HIGH (ref 70–99)

## 2018-11-19 LAB — RAPID URINE DRUG SCREEN, HOSP PERFORMED
Amphetamines: NOT DETECTED
Barbiturates: NOT DETECTED
Benzodiazepines: POSITIVE — AB
Cocaine: NOT DETECTED
Opiates: POSITIVE — AB
Tetrahydrocannabinol: NOT DETECTED

## 2018-11-19 MED ORDER — VANCOMYCIN HCL 10 G IV SOLR
2000.0000 mg | Freq: Once | INTRAVENOUS | Status: AC
  Administered 2018-11-19: 2000 mg via INTRAVENOUS
  Filled 2018-11-19: qty 2000

## 2018-11-19 MED ORDER — OXYCODONE HCL 5 MG PO TABS
5.0000 mg | ORAL_TABLET | ORAL | Status: DC
  Administered 2018-11-19 – 2018-11-27 (×36): 5 mg via ORAL
  Filled 2018-11-19 (×37): qty 1

## 2018-11-19 MED ORDER — VANCOMYCIN HCL 10 G IV SOLR
1250.0000 mg | Freq: Two times a day (BID) | INTRAVENOUS | Status: DC
  Administered 2018-11-20 – 2018-11-21 (×3): 1250 mg via INTRAVENOUS
  Filled 2018-11-19 (×4): qty 1250

## 2018-11-19 MED ORDER — POLYETHYLENE GLYCOL 3350 17 G PO PACK
17.0000 g | PACK | Freq: Two times a day (BID) | ORAL | Status: DC
  Administered 2018-11-19 – 2018-12-01 (×11): 17 g via ORAL
  Filled 2018-11-19 (×17): qty 1

## 2018-11-19 MED ORDER — BISACODYL 5 MG PO TBEC
5.0000 mg | DELAYED_RELEASE_TABLET | Freq: Every day | ORAL | Status: DC | PRN
  Administered 2018-11-27 – 2018-11-28 (×2): 5 mg via ORAL
  Filled 2018-11-19 (×2): qty 1

## 2018-11-19 NOTE — Progress Notes (Signed)
Patient ID: [name], male   DOB: July 30, 1965, 53 y.o.   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  Susceptibilities of the organism are not likely to be back until tomorrow.  His nasal PCR for MRSA is positive.  I will restart vancomycin and continue it along with cefazolin pending final cultures.  Michel Bickers, MD [facility] [place] [facility] [phone] pager   [phone] cell 11/19/2018, 2:52 PM

## 2018-11-19 NOTE — Plan of Care (Signed)

## 2018-11-19 NOTE — H&P (View-Only) (Signed)
Patient ID: [name], male   DOB: 11-Dec-1965, 53 y.o.   MRN: [id number] Postoperative day 1 irrigation and debridement right total knee arthroplasty arthroscopically.  There are gram-positive cocci in all cultures.  Discussed with the patient that we will proceed with surgery on Wednesday to remove his total knee and placement of a anatomic antibiotic spacer.  Discussed that he will have at least 2 surgeries 1 for the antibiotic spacer placement and the second for revision total knee arthroplasty.   Discussed the importance of minimizing his narcotic pain medicine at this time due to the fact that he has 2 upcoming surgeries that will require pain medication.

## 2018-11-19 NOTE — Progress Notes (Signed)
Pharmacy Antibiotic Note  [name] is a 53 y.o. male admitted on 11/16/2018 with septic arthritis/prosthetic knee infection and bacteremia. Treated empirically with vancomycin and cefepime and later de-escalted to cefazolin monotherapy and I&D done on 10/30. Patient fevered overnight with Tmax 101.1. Susceptibilities of synovial fluid culture (staph aureus) not back yet and nasal PCR was positive for MRSA earlier this month. Pharmacy has been consulted to restart vancomycin.  Today, WBC trended down but slightly elevated, currently afebrile.  Plan: Vancomycin 2000mg  IV x 1 then vanc 1250mg  IV q12h (estAUC 482 using SCr 1) Continue cefazolin 2gm IV q8h Monitor clinical picture, renal function, vanc levels if needed F/U C&S, abx de-escalation, LOT  Height: 6\' 3"  (190.5 cm) Weight: 184 lb 1.4 oz (83.5 kg) IBW/kg (Calculated) : 84.5  Temp (24hrs), Avg:99.2 F (37.3 C), Min:98.3 F (36.8 C), Max:101.1 F (38.4 C)  Recent Labs  Lab 11/16/18 2038 11/17/18 0745 11/18/18 0315 11/19/18 0201  WBC 15.8* 13.8* 12.6* 11.4*  CREATININE 1.00 1.02 1.06 1.00    Estimated Creatinine Clearance: 98.6 mL/min (by C-G formula based on SCr of 1 mg/dL).    Allergies  Allergen Reactions  . Benadryl [Diphenhydramine Hcl] Other (See Comments)    Leg spasms   . Trazodone And Nefazodone Other (See Comments)    Leg spasms     Antimicrobials this admission: 10/29 Vancomycin x1> restarted 10/31 10/28 Cefepime >> 10/29 10/29 Cefazolin >>    Microbiology results: 10/13 MRSA PCR positive 10/29 MRSA PCR neg 10/28 BCx: staph aureus  10/28 Synovial fluid: staph aureus (reinbubated) 10/30 AFB neg, Cx sent 10/30 wound cx pending (few staph aureus)  Brendolyn Patty, PharmD PGY2 Pharmacy Resident Phone [phone]  11/19/2018   3:12 PM

## 2018-11-19 NOTE — Evaluation (Signed)
Physical Therapy Evaluation Patient Details Name: [name] MRN: [id number] DOB: 1965-09-13 Today's Date: 11/19/2018   History of Present Illness  Pt is a 53 y/o male admitted secondary to R knee pain. Pt is s/p irrigation and debridement right total knee arthroplasty arthroscopically. Cultures were found to have gram-positive cocci in all. Plan for surgery on 11/4 to remove total knee with placement of an anatomic antibiotic spacer. PMH including but not limited to DM, ETOH abuse, R transmetatarsal amputation (05/2018)    Clinical Impression  Pt presented sitting upright in chair, awake and willing to participate in therapy session. Prior to admission, pt reported that he was independent with all functional mobility and ADLs. Pt lives with his son in a single level home with several steps to enter. He will have 24/7 supervision/assistance upon d/c. Pt overall requiring min A for transfers and min guard to min A for short distance ambulation with RW. Pt would continue to benefit from skilled physical therapy services at this time while admitted and after d/c to address the below listed limitations in order to improve overall safety and independence with functional mobility.     Follow Up Recommendations Home health PT;Supervision/Assistance - 24 hour (may need SNF pending how he does after his upcoming planned surgeries)    Equipment Recommendations  None recommended by PT    Recommendations for Other Services       Precautions / Restrictions Precautions Precautions: Fall Precaution Comments: R knee drain Restrictions Weight Bearing Restrictions: Yes RLE Weight Bearing: Weight bearing as tolerated      Mobility  Bed Mobility               General bed mobility comments: pt OOB in recliner chair upon arrival  Transfers Overall transfer level: Needs assistance Equipment used: Rolling walker (2 wheeled) Transfers: Sit to/from Stand Sit to Stand: Min assist          General transfer comment: increased time and effort, good technique, assistance for stability with transitional movement  Ambulation/Gait Ambulation/Gait assistance: Min assist;Min guard Gait Distance (Feet): 25 Feet Assistive device: Rolling walker (2 wheeled) Gait Pattern/deviations: Step-to pattern;Decreased step length - right;Decreased step length - left;Decreased stance time - right;Decreased stride length;Decreased weight shift to right Gait velocity: decreased   General Gait Details: pt with slow, guarded gait with heavy reliance on bilateral UEs on RW; pt with LOB x3 requiring min A to maintain upright standing position  Stairs            Wheelchair Mobility    Modified Rankin (Stroke Patients Only)       Balance Overall balance assessment: Needs assistance Sitting-balance support: Feet supported Sitting balance-Leahy Scale: Fair     Standing balance support: Bilateral upper extremity supported Standing balance-Leahy Scale: Poor                               Pertinent Vitals/Pain Pain Assessment: 0-10 Pain Score: 9  Pain Location: R knee Pain Descriptors / Indicators: Guarding;Grimacing Pain Intervention(s): Monitored during session;Repositioned    Home Living Family/patient expects to be discharged to:: Private residence Living Arrangements: Children Available Help at Discharge: Family;Available 24 hours/day Type of Home: House Home Access: Stairs to enter Entrance Stairs-Rails: Right;Left;Can reach both Entrance Stairs-Number of Steps: 5 Home Layout: One level Home Equipment: Walker - 2 wheels;Cane - single point;Crutches      Prior Function Level of Independence: Independent  Hand Dominance        Extremity/Trunk Assessment   Upper Extremity Assessment Upper Extremity Assessment: Overall WFL for tasks assessed    Lower Extremity Assessment Lower Extremity Assessment: RLE deficits/detail RLE Deficits /  Details: pt with decreased strength and ROM limitations secondary to increasing knee pain; drain and [place] wrapping donned to knee RLE: Unable to fully assess due to pain    Cervical / Trunk Assessment Cervical / Trunk Assessment: Normal  Communication   Communication: No difficulties  Cognition Arousal/Alertness: Awake/alert Behavior During Therapy: WFL for tasks assessed/performed Overall Cognitive Status: Within Functional Limits for tasks assessed                                        General Comments      Exercises     Assessment/Plan    PT Assessment Patient needs continued PT services  PT Problem List Decreased strength;Decreased range of motion;Decreased activity tolerance;Decreased balance;Decreased mobility;Decreased coordination;Decreased knowledge of use of DME;Decreased safety awareness;Decreased knowledge of precautions;Pain       PT Treatment Interventions DME instruction;Gait training;Stair training;Functional mobility training;Therapeutic activities;Therapeutic exercise;Balance training;Neuromuscular re-education;Patient/family education    PT Goals (Current goals can be found in the Care Plan section)  Acute Rehab PT Goals Patient Stated Goal: to get his infection cleared up PT Goal Formulation: With patient Time For Goal Achievement: 12/03/18 Potential to Achieve Goals: Good    Frequency Min 3X/week   Barriers to discharge        Co-evaluation               AM-PAC PT "6 Clicks" Mobility  Outcome Measure Help needed turning from your back to your side while in a flat bed without using bedrails?: A Little Help needed moving from lying on your back to sitting on the side of a flat bed without using bedrails?: A Little Help needed moving to and from a bed to a chair (including a wheelchair)?: A Little Help needed standing up from a chair using your arms (e.g., wheelchair or bedside chair)?: A Little Help needed to walk in  hospital room?: A Little Help needed climbing 3-5 steps with a railing? : A Lot 6 Click Score: 17    End of Session Equipment Utilized During Treatment: Gait belt Activity Tolerance: Patient limited by pain Patient left: in chair;with call bell/phone within reach Nurse Communication: Mobility status PT Visit Diagnosis: Other abnormalities of gait and mobility (R26.89);Pain Pain - Right/Left: Right Pain - part of body: Knee    Time: [id number] PT Time Calculation (min) (ACUTE ONLY): 24 min   Charges:   PT Evaluation $PT Eval Moderate Complexity: 1 Mod PT Treatments $Gait Training: 8-22 mins        Anastasio Champion, DPT  Acute Rehabilitation Services Pager [phone] Office [place] 11/19/2018, 5:32 PM

## 2018-11-19 NOTE — Progress Notes (Signed)
Patient ID: [name], male   DOB: 1965/10/14, 53 y.o.   MRN: [id number] Postoperative day 1 irrigation and debridement right total knee arthroplasty arthroscopically.  There are gram-positive cocci in all cultures.  Discussed with the patient that we will proceed with surgery on Wednesday to remove his total knee and placement of a anatomic antibiotic spacer.  Discussed that he will have at least 2 surgeries 1 for the antibiotic spacer placement and the second for revision total knee arthroplasty.   Discussed the importance of minimizing his narcotic pain medicine at this time due to the fact that he has 2 upcoming surgeries that will require pain medication.

## 2018-11-20 DIAGNOSIS — B9561 Methicillin susceptible Staphylococcus aureus infection as the cause of diseases classified elsewhere: Secondary | ICD-10-CM

## 2018-11-20 LAB — GLUCOSE, CAPILLARY
Glucose-Capillary: 100 mg/dL — ABNORMAL HIGH (ref 70–99)
Glucose-Capillary: 129 mg/dL — ABNORMAL HIGH (ref 70–99)
Glucose-Capillary: 144 mg/dL — ABNORMAL HIGH (ref 70–99)
Glucose-Capillary: 153 mg/dL — ABNORMAL HIGH (ref 70–99)
Glucose-Capillary: 161 mg/dL — ABNORMAL HIGH (ref 70–99)
Glucose-Capillary: 168 mg/dL — ABNORMAL HIGH (ref 70–99)
Glucose-Capillary: 171 mg/dL — ABNORMAL HIGH (ref 70–99)

## 2018-11-20 MED ORDER — ZOLPIDEM TARTRATE 5 MG PO TABS
5.0000 mg | ORAL_TABLET | Freq: Every day | ORAL | Status: DC
  Administered 2018-11-20 – 2018-11-26 (×7): 5 mg via ORAL
  Filled 2018-11-20 (×9): qty 1

## 2018-11-20 NOTE — Progress Notes (Addendum)
PROGRESS NOTE    [name]   QIO:[id number]  DOB: January 01, 1966  DOA: 11/16/2018 PCP: Lucia Gaskins, MD   Brief Narrative:  [name] is a 53 y/o male with DM2, CVA. 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 He drove to [place] last weekend and on the drive back he noted the swelling in his knee which extended down to his foot along with redness. He had pain as well. He did not not fevers or chills. He does not recall any injury to the knee. He made an appt with Dr Sharol Given on 10/28 for later in the week but then decided to come to the ED. In the ED, the knee was aspirated and he was stared on IV antibiotics for possible septic arthritis.   He was last admitted 10/12-10/13 when he was brought in unresponsive due to overdose of Oxycodone.    Subjective: Has not been sleeping well. Feels hot and has had sweats.     Assessment & Plan:   Principal Problem:   Septic right knee joint -  MRSA bacteremia SIRS with tachycardia and leukocytosis - ? If MRSA bacteremia due to drug use- he has healing ulcers on feet which are less likely to be point of entry - WBC 15.8, ESR 120 - in ED > 85 cc of fluid aspirated - cell count 16,050, 93% neutrophils no organisms on gram stain and no crystals - 10/30 > s/p removal of 100 cc of pus like material - also has grown out MRSA  - Dr Sharol Given plans to take back to [place] next week to remove hardware - cont pain control> Oxycodone and Dilaudid Q 4 PRN- Asking for Oxycodone to be doubled to 10 mg, I declined to do this as he was not taking it frequent enough- subsequently refusing Oxycodone and taking only Dilaudid- appears comfortable - blood cultures + for GPC- ID has been consulted-  Will need TEE After hardware removed-   - started on Vanc and Cefepime in ED- blood culture grew out G + cocci- changed to Vanc and Ancef- now on  solely Vanc  Active Problems:  Insomnia - add Ambien for tonight    Diabetes mellitus type 2 in nonobese  - A1c is 6.4 - Holding Metformin- cont SSI for now    Hypokalemia - replaced    Anemia, microcytic -check anemia panel- Iron saturation low- Ferritin normal but this may be an acute phase reactant- started oral Iron replacement B12 low normal- started B12 replacement as well  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  Ulcer on left foot plantar aspect and small healing ulcer on right foot - Dr Sharol Given following this as outpt-   Nicotine abuse - cont Nicotine patch-   Left arm numbness and tingling - has been going on since at least Sept- MRI brain negative - received a trial of Prednisone of this at ortho office- He does not feel it is much better- apparently there is a plan for an outpt EMG  Overdose on Oxycodone, Benzo and Cocaine a few wks ago He did have a prescription for Oxycodone- He has Benzodiazepine in his urine although his med rec did not show he was prescribed any - currently UDS on 10/31 > opiate and Benzo +- opiates being given in the hospital - his UDS has been found to be cocaine positive for many years but is  negative currently  Time spent in minutes: 35 DVT prophylaxis: Lovenox Code Status: Full code Family Communication:  Disposition Plan: per ortho Consultants:   ortho Procedures:  2 D ECHO 1. Left ventricular ejection fraction, by visual estimation, is 70 to 75%. The left ventricle has hyperdynamic function. There is mildly increased left ventricular hypertrophy.  2. Indeterminate diastolic filling due to E-A fusion.  3. The left ventricle has no regional wall motion abnormalities.  4. Global right ventricle has normal systolic function.The right ventricular size is normal. No increase in right ventricular wall thickness.  5. Left atrial size was normal.  6. Right atrial size was normal.  7. Mild thickening of the anterior and  posterior mitral valve leaflet(s).  8. The mitral valve is grossly normal. No evidence of mitral valve regurgitation. No evidence of mitral stenosis.  9. The tricuspid valve is grossly normal. Tricuspid valve regurgitation is not demonstrated. 10. The aortic valve is tricuspid. Aortic valve regurgitation is not visualized. No evidence of aortic valve sclerosis or stenosis. 11. The pulmonic valve was grossly normal. Pulmonic valve regurgitation is not visualized. 12. TR signal is inadequate for assessing pulmonary artery systolic pressure. 13. The inferior vena cava is normal in size with greater than 50% respiratory variability, suggesting right atrial pressure of 3 mmHg.  Antimicrobials:  Anti-infectives (From admission, onward)   Start     Dose/Rate Route Frequency Ordered Stop   11/20/18 0400  vancomycin (VANCOCIN) 1,250 mg in sodium chloride 0.9 % 250 mL IVPB     1,250 mg 166.7 mL/hr over 90 Minutes Intravenous Every 12 hours 11/19/18 1509     11/19/18 1600  vancomycin (VANCOCIN) 2,000 mg in sodium chloride 0.9 % 500 mL IVPB     2,000 mg 250 mL/hr over 120 Minutes Intravenous  Once 11/19/18 1509 11/19/18 1816   11/18/18 0600  ceFAZolin (ANCEF) IVPB 2g/100 mL premix  Status:  Discontinued     2 g 200 mL/hr over 30 Minutes Intravenous On call to O.R. 11/17/18 2043 11/18/18 1052   11/17/18 1400  ceFAZolin (ANCEF) IVPB 2g/100 mL premix  Status:  Discontinued     2 g 200 mL/hr over 30 Minutes Intravenous Every 8 hours 11/17/18 1008 11/20/18 1307   11/17/18 1200  vancomycin (VANCOCIN) 1,250 mg in sodium chloride 0.9 % 250 mL IVPB  Status:  Discontinued     1,250 mg 166.7 mL/hr over 90 Minutes Intravenous Every 12 hours 11/17/18 0204 11/17/18 0730   11/17/18 0800  ceFEPIme (MAXIPIME) 2 g in sodium chloride 0.9 % 100 mL IVPB  Status:  Discontinued     2 g 200 mL/hr over 30 Minutes Intravenous Every 8 hours 11/17/18 0204 11/17/18 0730   11/17/18 0115  vancomycin (VANCOCIN) IVPB 1000 mg/200  mL premix     1,000 mg 200 mL/hr over 60 Minutes Intravenous  Once 11/17/18 0000 11/17/18 0254   11/17/18 0015  vancomycin (VANCOCIN) IVPB 1000 mg/200 mL premix     1,000 mg 200 mL/hr over 60 Minutes Intravenous  Once 11/17/18 0000 11/17/18 0153   11/16/18 2330  vancomycin (VANCOCIN) 2,000 mg in sodium chloride 0.9 % 500 mL IVPB  Status:  Discontinued     2,000 mg 250 mL/hr over 120 Minutes Intravenous  Once 11/16/18 2319 11/17/18 0000   11/16/18 2330  ceFEPIme (MAXIPIME) 2 g in sodium chloride 0.9 % 100 mL IVPB     2 g 200 mL/hr over 30 Minutes Intravenous  Once 11/16/18 2319 11/17/18 0045  Objective: Vitals:   11/19/18 2108 11/20/18 0335 11/20/18 0500 11/20/18 1438  BP: (!) 148/82 129/80  (!) 147/79  Pulse: 92 79  88  Resp:    13  Temp: 99.8 F (37.7 C) 98.2 F (36.8 C)  98.3 F (36.8 C)  TempSrc: Oral Oral  Oral  SpO2: 100% 99%  99%  Weight:   84.3 kg   Height:        Intake/Output Summary (Last 24 hours) at 11/20/2018 1513 Last data filed at 11/20/2018 0950 Gross per 24 hour  Intake 2236.81 ml  Output 3725 ml  Net -1488.19 ml   Filed Weights   11/18/18 0903 11/19/18 0500 11/20/18 0500  Weight: 82.4 kg 83.5 kg 84.3 kg    Examination: General exam: Appears comfortable  HEENT: PERRLA, oral mucosa moist, no sclera icterus or thrush Respiratory system: Clear to auscultation. Respiratory effort normal. Cardiovascular system: S1 & S2 heard,  No murmurs  Gastrointestinal system: Abdomen soft, non-tender, nondistended. Normal bowel sounds   Central nervous system: Alert and oriented. No focal neurological deficits. Extremities: No cyanosis, clubbing- right knee in dressing, not opened- drain with serosanguinous drainage- right foot swollen Skin: No rashes or ulcers Psychiatry:  Mood & affect appropriate.  Skin: Left foot    Psychiatry:  Mood & affect appropriate.     Data Reviewed: I have personally reviewed following labs and imaging studies  CBC:  Recent Labs  Lab 11/16/18 2038 11/17/18 0745 11/18/18 0315 11/19/18 0201  WBC 15.8* 13.8* 12.6* 11.4*  NEUTROABS 12.8*  --   --   --   HGB 11.5* 10.0* 10.0* 9.6*  HCT 36.4* 29.9* 30.7* 29.2*  MCV 82.0 79.5* 80.4 79.1*  PLT 429* 325 291 081   Basic Metabolic Panel: Recent Labs  Lab 11/16/18 2038 11/17/18 0745 11/18/18 0315 11/19/18 0201  NA 138 136 133* 133*  K 3.2* 3.1* 3.6 3.9  CL 104 105 100 99  CO2 22 21* 20* 21*  GLUCOSE 206* 118* 126* 153*  BUN _0 CREATININE 1.00 1.02 1.06 1.00  CALCIUM 9.6 8.5* 9.1 8.9   GFR: Estimated Creatinine Clearance: 99.5 mL/min (by C-G formula based on SCr of 1 mg/dL). Liver Function Tests: Recent Labs  Lab 11/16/18 2038  AST 16  ALT 30  ALKPHOS 227*  BILITOT 0.6  PROT 8.2*  ALBUMIN 3.1*   No results for input(s): LIPASE, AMYLASE in the last 168 hours. No results for input(s): AMMONIA in the last 168 hours. Coagulation Profile: No results for input(s): INR, PROTIME in the last 168 hours. Cardiac Enzymes: No results for input(s): CKTOTAL, CKMB, CKMBINDEX, TROPONINI in the last 168 hours. BNP (last 3 results) No results for input(s): PROBNP in the last 8760 hours. HbA1C: No results for input(s): HGBA1C in the last 72 hours. CBG: Recent Labs  Lab 11/19/18 2106 11/19/18 2337 11/20/18 0333 11/20/18 0757 11/20/18 1137  GLUCAP 127* 219* 129* 161* 153*   Lipid Profile: No results for input(s): CHOL, HDL, LDLCALC, TRIG, CHOLHDL, LDLDIRECT in the last 72 hours. Thyroid Function Tests: No results for input(s): TSH, T4TOTAL, FREET4, T3FREE, THYROIDAB in the last 72 hours. Anemia Panel: Recent Labs    11/18/18 0315  VITAMINB12 225  FOLATE 9.0  FERRITIN 248  TIBC 174*  IRON 12*  RETICCTPCT 0.6   Urine analysis:    Component Value Date/Time   COLORURINE STRAW (A) 10/31/2018 1928   APPEARANCEUR CLEAR 10/31/2018 1928   LABSPEC 1.006 10/31/2018 1928   PHURINE 5.0 10/31/2018  1928   GLUCOSEU 50 (A) 10/31/2018  1928   HGBUR SMALL (A) 10/31/2018 [place] NEGATIVE 10/31/2018 1928   KETONESUR 20 (A) 10/31/2018 1928   PROTEINUR NEGATIVE 10/31/2018 1928   UROBILINOGEN 0.2 12/18/2013 1402   NITRITE NEGATIVE 10/31/2018 1928   LEUKOCYTESUR NEGATIVE 10/31/2018 1928   Sepsis Labs: _0 (procalcitonin:4,lacticidven:4) ) Recent Results (from the past 240 hour(s))  Gram stain     Status: None   Collection Time: 11/16/18  9:00 PM   Specimen: Synovium; Body Fluid  Result Value Ref Range Status   Specimen Description SYNOVIAL  Final   Special Requests KNEE,JOINT  Final   Gram Stain   Final    ABUNDANT WBC PRESENT,BOTH PMN AND MONONUCLEAR NO ORGANISMS SEEN Performed at [facility], [address] [place], [place] [address]    Report Status 11/16/2018 FINAL  Final  Culture, body fluid-bottle     Status: Abnormal (Preliminary result)   Collection Time: 11/16/18  9:00 PM   Specimen: Synovium  Result Value Ref Range Status   Specimen Description   Final    SYNOVIAL Performed at [facility], [address], [place], [place] [address]    Special Requests   Final    KNEE,JOINT Performed at [facility], [address], [place], [place] [address]    Gram Stain   Final    GRAM POSITIVE COCCI AEROBIC AND ANAEROBIC Gram Stain Report Called to,Read Back By and Verified With: HARVEY N. AT [place] [id number] BY THOMPSON S. Performed at [facility] [address] [place], [place] [address]    Culture (A)  Final    STAPHYLOCOCCUS AUREUS SUSCEPTIBILITIES TO FOLLOW Performed at [facility] [address], [place], [place] [address]    Report Status PENDING  Incomplete  SARS CORONAVIRUS 2 (TAT 6-24 HRS) Nasopharyngeal Nasopharyngeal Swab     Status: None   Collection Time: 11/16/18 10:10 PM   Specimen: Nasopharyngeal Swab  Result Value Ref Range Status   SARS Coronavirus 2 NEGATIVE NEGATIVE Final    Comment: (NOTE) SARS-CoV-2 target nucleic acids are NOT DETECTED. The  SARS-CoV-2 RNA is generally detectable in upper and lower respiratory specimens during the acute phase of infection. Negative results do not preclude SARS-CoV-2 infection, do not rule out co-infections with other pathogens, and should not be used as the sole basis for treatment or other patient management decisions. Negative results must be combined with clinical observations, patient history, and epidemiological information. The expected result is Negative. Fact Sheet for Patients: SugarRoll.be Fact Sheet for Healthcare Providers: https://www.woods-mathews.com/ This test is not yet approved or cleared by the [place] FDA and  has been authorized for detection and/or diagnosis of SARS-CoV-2 by FDA under an Emergency Use Authorization (EUA). This EUA will remain  in effect (meaning this test can be used) for the duration of the COVID-19 declaration under Section 56 4(b)(1) of the Act, 21 U.S.C. section 360bbb-3(b)(1), unless the authorization is terminated or revoked sooner. Performed at [facility], [place] [address], [place], [place] [address]   Culture, blood (Routine X 2) w Reflex to ID Panel     Status: Abnormal   Collection Time: 11/16/18 11:16 PM   Specimen: BLOOD  Result Value Ref Range Status   Specimen Description   Final    BLOOD RIGHT ANTECUBITAL Performed at [facility], [address], [place], [place] [address]    Special Requests   Final    BOTTLES DRAWN AEROBIC AND ANAEROBIC  Blood Culture adequate volume Performed at [facility], [address] [place], [place] [address]    Culture  Setup Time   Final    BOTH AEROBIC AND ANAEROBIC GRAM POSITIVE COCCI Gram Stain Report Called to,Read Back By and Verified With: K DULIL,RN _0  11/18/18 MKELLY Performed at [facility], [address] [place], [place] [address]    Culture METHICILLIN RESISTANT STAPHYLOCOCCUS AUREUS (A)  Final   Report Status 11/20/2018 FINAL   Final   Organism ID, Bacteria METHICILLIN RESISTANT STAPHYLOCOCCUS AUREUS  Final      Susceptibility   Methicillin resistant staphylococcus aureus - MIC*    CIPROFLOXACIN >=8 RESISTANT Resistant     ERYTHROMYCIN >=8 RESISTANT Resistant     GENTAMICIN <=0.5 SENSITIVE Sensitive     OXACILLIN >=4 RESISTANT Resistant     TETRACYCLINE >=16 RESISTANT Resistant     VANCOMYCIN 1 SENSITIVE Sensitive     TRIMETH/SULFA >=320 RESISTANT Resistant     CLINDAMYCIN RESISTANT Resistant     RIFAMPIN <=0.5 SENSITIVE Sensitive     Inducible Clindamycin POSITIVE Resistant     * METHICILLIN RESISTANT STAPHYLOCOCCUS AUREUS  Culture, blood (Routine X 2) w Reflex to ID Panel     Status: None (Preliminary result)   Collection Time: 11/16/18 11:18 PM   Specimen: BLOOD RIGHT HAND  Result Value Ref Range Status   Specimen Description BLOOD RIGHT HAND  Final   Special Requests   Final    BOTTLES DRAWN AEROBIC AND ANAEROBIC Blood Culture adequate volume   Culture   Final    NO GROWTH 4 DAYS Performed at [facility], [address], [place], [place] [address]    Report Status PENDING  Incomplete  Surgical pcr screen     Status: None   Collection Time: 11/17/18  2:59 PM   Specimen: Nasal Mucosa; Nasal Swab  Result Value Ref Range Status   MRSA, PCR NEGATIVE NEGATIVE Final   Staphylococcus aureus NEGATIVE NEGATIVE Final    Comment: (NOTE) The Xpert SA Assay (FDA approved for NASAL specimens in patients 65 years of age and older), is one component of a comprehensive surveillance program. It is not intended to diagnose infection nor to guide or monitor treatment. Performed at [facility], [place] [address], [place], [place] [address]   Aerobic/Anaerobic Culture (surgical/deep wound)     Status: None (Preliminary result)   Collection Time: 11/18/18 10:06 AM   Specimen: PATH Soft tissue  Result Value Ref Range Status   Specimen Description TISSUE  Final   Special Requests NONE  Final   Gram Stain    Final    ABUNDANT WBC PRESENT, PREDOMINANTLY PMN RARE GRAM POSITIVE COCCI Performed at [facility] [address], [place], [place] [address]    Culture   Final    FEW METHICILLIN RESISTANT STAPHYLOCOCCUS AUREUS NO ANAEROBES ISOLATED; CULTURE IN PROGRESS FOR 5 DAYS    Report Status PENDING  Incomplete   Organism ID, Bacteria METHICILLIN RESISTANT STAPHYLOCOCCUS AUREUS  Final      Susceptibility   Methicillin resistant staphylococcus aureus - MIC*    CIPROFLOXACIN >=8 RESISTANT Resistant     ERYTHROMYCIN >=8 RESISTANT Resistant     GENTAMICIN <=0.5 SENSITIVE Sensitive     OXACILLIN >=4 RESISTANT Resistant     TETRACYCLINE >=16 RESISTANT Resistant     VANCOMYCIN 1 SENSITIVE Sensitive     TRIMETH/SULFA >=320 RESISTANT Resistant     CLINDAMYCIN RESISTANT Resistant     RIFAMPIN <=0.5 SENSITIVE Sensitive  Inducible Clindamycin POSITIVE Resistant     * FEW METHICILLIN RESISTANT STAPHYLOCOCCUS AUREUS  Acid Fast Smear (AFB)     Status: None   Collection Time: 11/18/18 10:06 AM   Specimen: PATH Soft tissue  Result Value Ref Range Status   AFB Specimen Processing Concentration  Final   Acid Fast Smear Negative  Final    Comment: (NOTE) Performed At: [facility] [place], [place] [id number] Rush Farmer MD ML:[id number]    Source (AFB) TISSUE  Final    Comment: Performed at [facility], [place] [address], [place], [place] [address]         Radiology Studies: No results found.    Scheduled Meds: . docusate sodium  100 mg Oral BID  . enoxaparin (LOVENOX) injection  40 mg Subcutaneous Q24H  . ferrous sulfate  325 mg Oral BID WC  . insulin aspart  0-9 Units Subcutaneous TID WC  . nicotine  21 mg Transdermal Daily  . oxyCODONE  5 mg Oral Q4H  . pentoxifylline  400 mg Oral TID WC  . polyethylene glycol  17 g Oral BID  . rosuvastatin  20 mg Oral Daily  . senna-docusate  2 tablet Oral QHS  . vitamin B-12  1,000 mcg Oral Daily  . zolpidem   5 mg Oral QHS   Continuous Infusions: . vancomycin 1,250 mg (11/20/18 1506)     LOS: 4 days      Debbe Odea, MD [facility] Pager: [email or web address] Password [email or web address] 11/20/2018, 3:13 PM

## 2018-11-20 NOTE — Evaluation (Signed)
Occupational Therapy Evaluation Patient Details Name: [name] MRN: [id number] DOB: [birth year] Today's Date: 11/20/2018    History of Present Illness Pt is a 53 y/o male admitted secondary to R knee pain. Pt is s/p irrigation and debridement right total knee arthroplasty arthroscopically. Cultures were found to have gram-positive cocci in all. Plan for surgery on 11/4 to remove total knee with placement of an anatomic antibiotic spacer. 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 Admitted for above and limited by problem list below, including R knee pain, decreased activity tolerance, impaired balance, and limited functional use of L hand. Patient reports living with his son, who is available to assist as needed at dc.  He currently requires min assist for transfers, min assist for bed mobility, mod assist for LB ADLs, mod assist for toileting, and setup for UB ADLs. Patient will benefit from continued OT services while admitted and after dc at [facility] level (pending progress/surgery) in order to optimize independence with ADLs and mobility.     Follow Up Recommendations  Home health OT;Supervision - Intermittent(pending progress and upcoming surgeries)    Equipment Recommendations  3 in 1 bedside commode    Recommendations for Other Services       Precautions / Restrictions Precautions Precautions: Fall Precaution Comments: R knee drain Required Braces or Orthoses: Other Brace Other Brace: wrist splint to L  Restrictions Weight Bearing Restrictions: No RLE Weight Bearing: Weight bearing as tolerated      Mobility Bed Mobility Overal bed mobility: Needs Assistance Bed Mobility: Sit to Supine       Sit to supine: Min assist   General bed mobility comments: returned to bed after using BSC with min assist for mgmt of R LE   Transfers Overall transfer level: Needs  assistance Equipment used: Rolling walker (2 wheeled) Transfers: Sit to/from Stand Sit to Stand: Min assist         General transfer comment: min assist to power up and stabilize given increased time and effort    Balance Overall balance assessment: Needs assistance Sitting-balance support: No upper extremity supported;Feet supported Sitting balance-Leahy Scale: Fair     Standing balance support: Bilateral upper extremity supported;During functional activity Standing balance-Leahy Scale: Poor Standing balance comment: relaint on BUE and external support                           ADL either performed or assessed with clinical judgement   ADL Overall ADL's : Needs assistance/impaired     Grooming: Minimal assistance;Sitting Grooming Details (indicate cue type and reason): decreased functional use of L UE  Upper Body Bathing: Sitting;Set up   Lower Body Bathing: Moderate assistance;Sit to/from stand Lower Body Bathing Details (indicate cue type and reason): R knee pain, impaired balance and reliant on BUE support in standing  Upper Body Dressing : Set up;Sitting   Lower Body Dressing: Moderate assistance;Sit to/from stand Lower Body Dressing Details (indicate cue type and reason): R knee pain, impaired balance and reliant on BUE support in standing  Toilet Transfer: Minimal assistance;Stand-pivot;RW;BSC   Toileting- Clothing Manipulation and Hygiene: Moderate assistance;Sit to/from stand;Sitting/lateral lean Toileting - Clothing Manipulation Details (indicate cue type and reason): hygiene seated with supervision, assist for clothing mgmt     Functional mobility during ADLs: Minimal assistance;Rolling walker General ADL Comments: pt limited by decreased functional use of L  hand, R knee pain, impaired balance     Vision         Perception     Praxis      Pertinent Vitals/Pain Pain Assessment: Faces Faces Pain Scale: Hurts even more Pain Location: R  knee Pain Descriptors / Indicators: Guarding;Grimacing Pain Intervention(s): Monitored during session;Repositioned     Hand Dominance Right   Extremity/Trunk Assessment Upper Extremity Assessment Upper Extremity Assessment: LUE deficits/detail LUE Deficits / Details: pt reports "nerve impingment" appears to be radial nerve with inability to extend fingers, thumb or wrist more than 10% of normal movement with numbness/tingling on posterior forearm towards elbow (for approx 1 month) LUE Sensation: decreased light touch LUE Coordination: decreased fine motor   Lower Extremity Assessment Lower Extremity Assessment: Defer to PT evaluation   Cervical / Trunk Assessment Cervical / Trunk Assessment: Normal   Communication Communication Communication: No difficulties   Cognition Arousal/Alertness: Awake/alert Behavior During Therapy: WFL for tasks assessed/performed Overall Cognitive Status: Within Functional Limits for tasks assessed                                     General Comments  educated on positioning of LUE while resting, keeping wrist at neutral and fingers/thumb in extension    Exercises     Shoulder Instructions      Home Living Family/patient expects to be discharged to:: Private residence Living Arrangements: Children Available Help at Discharge: Family;Available 24 hours/day Type of Home: House Home Access: Stairs to enter [employer] of Steps: 5 Entrance Stairs-Rails: Right;Left;Can reach both Home Layout: One level     Bathroom Shower/Tub: Tub/shower unit;Walk-in shower   Bathroom Toilet: Standard     Home Equipment: Environmental consultant - 2 wheels;Cane - single point;Crutches          Prior Functioning/Environment Level of Independence: Independent                 OT Problem List: Decreased strength;Decreased range of motion;Decreased activity tolerance;Impaired balance (sitting and/or standing);Decreased coordination;Decreased  knowledge of use of DME or AE;Decreased knowledge of precautions;Impaired sensation;Pain;Impaired UE functional use      OT Treatment/Interventions: Self-care/ADL training;Therapeutic exercise;DME and/or AE instruction;Therapeutic activities;Balance training;Patient/family education;Splinting    OT Goals(Current goals can be found in the care plan section) Acute Rehab OT Goals Patient Stated Goal: to get his infection cleared up OT Goal Formulation: With patient Time For Goal Achievement: 12/04/18 Potential to Achieve Goals: Good  OT Frequency: Min 2X/week   Barriers to D/C:            Co-evaluation              AM-PAC OT "6 Clicks" Daily Activity     Outcome Measure Help from another person eating meals?: A Little Help from another person taking care of personal grooming?: A Little Help from another person toileting, which includes using toliet, bedpan, or urinal?: A Lot Help from another person bathing (including washing, rinsing, drying)?: A Lot Help from another person to put on and taking off regular upper body clothing?: A Little Help from another person to put on and taking off regular lower body clothing?: A Lot 6 Click Score: 15   End of Session Equipment Utilized During Treatment: Rolling walker Nurse Communication: Mobility status  Activity Tolerance: Patient tolerated treatment well Patient left: in bed;with call bell/phone within reach;with bed alarm set  OT Visit Diagnosis: Other abnormalities of gait  and mobility (R26.89);Muscle weakness (generalized) (M62.81);Pain Pain - Right/Left: Right Pain - part of body: Knee                Time: 1223-1242 OT Time Calculation (min): 19 min Charges:  OT General Charges $OT Visit: 1 Visit OT Evaluation $OT Eval Moderate Complexity: [place], OT Acute Rehabilitation Services Pager [phone] Office [phone]   Delight Stare 11/20/2018, 1:44 PM

## 2018-11-21 ENCOUNTER — Other Ambulatory Visit: Payer: Self-pay | Admitting: Physician Assistant

## 2018-11-21 LAB — GLUCOSE, CAPILLARY
Glucose-Capillary: 142 mg/dL — ABNORMAL HIGH (ref 70–99)
Glucose-Capillary: 142 mg/dL — ABNORMAL HIGH (ref 70–99)
Glucose-Capillary: 127 mg/dL — ABNORMAL HIGH (ref 70–99)
Glucose-Capillary: 167 mg/dL — ABNORMAL HIGH (ref 70–99)
Glucose-Capillary: 137 mg/dL — ABNORMAL HIGH (ref 70–99)

## 2018-11-21 LAB — CULTURE, BLOOD (ROUTINE X 2)
Culture: NO GROWTH
Special Requests: ADEQUATE

## 2018-11-21 LAB — CBC
HCT: 30.7 % — ABNORMAL LOW (ref 39.0–52.0)
Hemoglobin: 10.1 g/dL — ABNORMAL LOW (ref 13.0–17.0)
MCH: 25.9 pg — ABNORMAL LOW (ref 26.0–34.0)
MCHC: 32.9 g/dL (ref 30.0–36.0)
MCV: 78.7 fL — ABNORMAL LOW (ref 80.0–100.0)
Platelets: 328 10*3/uL (ref 150–400)
RBC: 3.9 MIL/uL — ABNORMAL LOW (ref 4.22–5.81)
RDW: 14.6 % (ref 11.5–15.5)
WBC: 10.6 10*3/uL — ABNORMAL HIGH (ref 4.0–10.5)
nRBC: 0 % (ref 0.0–0.2)

## 2018-11-21 LAB — CULTURE, BODY FLUID W GRAM STAIN -BOTTLE

## 2018-11-21 LAB — BASIC METABOLIC PANEL
Anion gap: 11 (ref 5–15)
BUN: 15 mg/dL (ref 6–20)
CO2: 23 mmol/L (ref 22–32)
Calcium: 8.6 mg/dL — ABNORMAL LOW (ref 8.9–10.3)
Chloride: 98 mmol/L (ref 98–111)
Creatinine, Ser: 0.84 mg/dL (ref 0.61–1.24)
GFR calc Af Amer: >60 mL/min (ref >60)
GFR calc non Af Amer: >60 mL/min (ref >60)
Glucose, Bld: 148 mg/dL — ABNORMAL HIGH (ref 70–99)
Potassium: 3.8 mmol/L (ref 3.5–5.1)
Sodium: 132 mmol/L — ABNORMAL LOW (ref 135–145)

## 2018-11-21 MED ORDER — VANCOMYCIN HCL 10 G IV SOLR
1500.0000 mg | Freq: Two times a day (BID) | INTRAVENOUS | Status: DC
  Administered 2018-11-21 – 2018-11-24 (×7): 1500 mg via INTRAVENOUS
  Filled 2018-11-21 (×8): qty 1500

## 2018-11-21 NOTE — Progress Notes (Signed)
PROGRESS NOTE    [name]   ONG:[id number]  DOB: [birth year]  DOA: 11/16/2018 PCP: Lucia Gaskins, MD   Brief Narrative:  [name] is a 53 y/o male with DM2, CVA. 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 He drove to [place] last weekend and on the drive back he noted the swelling in his knee which extended down to his foot along with redness. He had pain as well. He did not not fevers or chills. He does not recall any injury to the knee. He made an appt with Dr Sharol Given on 10/28 for later in the week but then decided to come to the ED. In the ED, the knee was aspirated and he was stared on IV antibiotics for possible septic arthritis.   He was last admitted 10/12-10/13 when he was brought in unresponsive due to overdose of Oxycodone.    Subjective: No complaints today.     Assessment & Plan:   Principal Problem:   Septic right knee joint -  MRSA bacteremia SIRS with tachycardia and leukocytosis - ? If MRSA bacteremia due to drug use- he has healing ulcers on feet which are less likely to be point of entry - WBC 15.8, ESR 120 - in ED > 85 cc of fluid aspirated - cell count 16,050, 93% neutrophils no organisms on gram stain and no crystals - 10/30 > s/p removal of 100 cc of pus like material - also has grown out MRSA  - Dr Sharol Given plans to take back to OR next week to remove hardware - cont pain control> Oxycodone and Dilaudid Q 4 PRN-  - blood cultures + for GPC- ID  autoconsulted-     - started on Vanc and Cefepime in ED- blood culture grew out G + cocci- changed to Vanc and Ancef- now on solely Vanc - awaiting surgery on Wed- he plans to go home with [facility] - he has done IV antibiotics at home in the past - discussed TEE with Dr Linus Salmons today- he feels the patient may not need it  Active Problems:  Insomnia - added Ambien      Diabetes mellitus  type 2 in nonobese  - A1c is 6.4 - Holding Metformin- cont SSI for now    Hypokalemia - replaced    Anemia, microcytic -check anemia panel- Iron saturation low- Ferritin normal but this may be an acute phase reactant- started oral Iron replacement B12 low normal- started B12 replacement as well  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  Ulcer on left foot plantar aspect and small healing ulcer on right foot - Dr Sharol Given following this as outpt-   Nicotine abuse - cont Nicotine patch-   Left arm numbness and tingling - has been going on since at least Sept- MRI brain negative - received a trial of Prednisone of this at ortho office- He does not feel it is much better- apparently there is a plan for an outpt EMG  Overdose on Oxycodone, Benzo and Cocaine a few wks ago He did have a prescription for Oxycodone- He has Benzodiazepine in his urine although his med rec did not show he was prescribed any - currently UDS on 10/31 > opiate and Benzo +- opiates being given in the hospital - his UDS has been found to be cocaine positive for many years but is negative currently  Time spent in  minutes: 35 DVT prophylaxis: Lovenox Code Status: Full code Family Communication:  Disposition Plan: per ortho Consultants:   ortho Procedures:  2 D ECHO 1. Left ventricular ejection fraction, by visual estimation, is 70 to 75%. The left ventricle has hyperdynamic function. There is mildly increased left ventricular hypertrophy.  2. Indeterminate diastolic filling due to E-A fusion.  3. The left ventricle has no regional wall motion abnormalities.  4. Global right ventricle has normal systolic function.The right ventricular size is normal. No increase in right ventricular wall thickness.  5. Left atrial size was normal.  6. Right atrial size was normal.  7. Mild thickening of the anterior and posterior mitral valve leaflet(s).  8. The mitral valve is grossly normal. No evidence of  mitral valve regurgitation. No evidence of mitral stenosis.  9. The tricuspid valve is grossly normal. Tricuspid valve regurgitation is not demonstrated. 10. The aortic valve is tricuspid. Aortic valve regurgitation is not visualized. No evidence of aortic valve sclerosis or stenosis. 11. The pulmonic valve was grossly normal. Pulmonic valve regurgitation is not visualized. 12. TR signal is inadequate for assessing pulmonary artery systolic pressure. 13. The inferior vena cava is normal in size with greater than 50% respiratory variability, suggesting right atrial pressure of 3 mmHg.  Antimicrobials:  Anti-infectives (From admission, onward)   Start     Dose/Rate Route Frequency Ordered Stop   11/21/18 1600  vancomycin (VANCOCIN) 1,500 mg in sodium chloride 0.9 % 500 mL IVPB     1,500 mg 250 mL/hr over 120 Minutes Intravenous Every 12 hours 11/21/18 1012     11/20/18 0400  vancomycin (VANCOCIN) 1,250 mg in sodium chloride 0.9 % 250 mL IVPB  Status:  Discontinued     1,250 mg 166.7 mL/hr over 90 Minutes Intravenous Every 12 hours 11/19/18 1509 11/21/18 1012   11/19/18 1600  vancomycin (VANCOCIN) 2,000 mg in sodium chloride 0.9 % 500 mL IVPB     2,000 mg 250 mL/hr over 120 Minutes Intravenous  Once 11/19/18 1509 11/19/18 1816   11/18/18 0600  ceFAZolin (ANCEF) IVPB 2g/100 mL premix  Status:  Discontinued     2 g 200 mL/hr over 30 Minutes Intravenous On call to O.R. 11/17/18 2043 11/18/18 1052   11/17/18 1400  ceFAZolin (ANCEF) IVPB 2g/100 mL premix  Status:  Discontinued     2 g 200 mL/hr over 30 Minutes Intravenous Every 8 hours 11/17/18 1008 11/20/18 1307   11/17/18 1200  vancomycin (VANCOCIN) 1,250 mg in sodium chloride 0.9 % 250 mL IVPB  Status:  Discontinued     1,250 mg 166.7 mL/hr over 90 Minutes Intravenous Every 12 hours 11/17/18 0204 11/17/18 0730   11/17/18 0800  ceFEPIme (MAXIPIME) 2 g in sodium chloride 0.9 % 100 mL IVPB  Status:  Discontinued     2 g 200 mL/hr over 30  Minutes Intravenous Every 8 hours 11/17/18 0204 11/17/18 0730   11/17/18 0115  vancomycin (VANCOCIN) IVPB 1000 mg/200 mL premix     1,000 mg 200 mL/hr over 60 Minutes Intravenous  Once 11/17/18 0000 11/17/18 0254   11/17/18 0015  vancomycin (VANCOCIN) IVPB 1000 mg/200 mL premix     1,000 mg 200 mL/hr over 60 Minutes Intravenous  Once 11/17/18 0000 11/17/18 0153   11/16/18 2330  vancomycin (VANCOCIN) 2,000 mg in sodium chloride 0.9 % 500 mL IVPB  Status:  Discontinued     2,000 mg 250 mL/hr over 120 Minutes Intravenous  Once 11/16/18 2319 11/17/18 0000   11/16/18 2330  ceFEPIme (MAXIPIME) 2 g in sodium chloride 0.9 % 100 mL IVPB     2 g 200 mL/hr over 30 Minutes Intravenous  Once 11/16/18 2319 11/17/18 0045       Objective: Vitals:   11/21/18 0348 11/21/18 0500 11/21/18 0521 11/21/18 1343  BP: (!) 142/75  (!) 144/78 121/71  Pulse: 91  88 80  Resp: 16   18  Temp: 99.5 F (37.5 C)   99 F (37.2 C)  TempSrc: Oral   Oral  SpO2: 98%   100%  Weight:  79.7 kg    Height:        Intake/Output Summary (Last 24 hours) at 11/21/2018 1502 Last data filed at 11/21/2018 1330 Gross per 24 hour  Intake 1774.49 ml  Output 2310 ml  Net -535.51 ml   Filed Weights   11/19/18 0500 11/20/18 0500 11/21/18 0500  Weight: 83.5 kg 84.3 kg 79.7 kg    Examination: General exam: Appears comfortable  HEENT: PERRLA, oral mucosa moist, no sclera icterus or thrush Respiratory system: Clear to auscultation. Respiratory effort normal. Cardiovascular system: S1 & S2 heard,  No murmurs  Gastrointestinal system: Abdomen soft, non-tender, nondistended. Normal bowel sounds   Central nervous system: Alert and oriented. No focal neurological deficits. Extremities: No cyanosis, clubbing - right knee in dressing with drain present- right foot swollen Skin: No rashes or ulcers Psychiatry:  Mood & affect appropriate.  Skin: Left foot    Psychiatry:  Mood & affect appropriate.     Data Reviewed: I have  personally reviewed following labs and imaging studies  CBC: Recent Labs  Lab 11/16/18 2038 11/17/18 0745 11/18/18 0315 11/19/18 0201 11/21/18 0348  WBC 15.8* 13.8* 12.6* 11.4* 10.6*  NEUTROABS 12.8*  --   --   --   --   HGB 11.5* 10.0* 10.0* 9.6* 10.1*  HCT 36.4* 29.9* 30.7* 29.2* 30.7*  MCV 82.0 79.5* 80.4 79.1* 78.7*  PLT 429* 325 291 284 741   Basic Metabolic Panel: Recent Labs  Lab 11/16/18 2038 11/17/18 0745 11/18/18 0315 11/19/18 0201 11/21/18 0348  NA 138 136 133* 133* 132*  K 3.2* 3.1* 3.6 3.9 3.8  CL 104 105 100 99 98  CO2 22 21* 20* 21* 23  GLUCOSE 206* 118* 126* 153* 148*  BUN _0 CREATININE 1.00 1.02 1.06 1.00 0.84  CALCIUM 9.6 8.5* 9.1 8.9 8.6*   GFR: Estimated Creatinine Clearance: 112 mL/min (by C-G formula based on SCr of 0.84 mg/dL). Liver Function Tests: Recent Labs  Lab 11/16/18 2038  AST 16  ALT 30  ALKPHOS 227*  BILITOT 0.6  PROT 8.2*  ALBUMIN 3.1*   No results for input(s): LIPASE, AMYLASE in the last 168 hours. No results for input(s): AMMONIA in the last 168 hours. Coagulation Profile: No results for input(s): INR, PROTIME in the last 168 hours. Cardiac Enzymes: No results for input(s): CKTOTAL, CKMB, CKMBINDEX, TROPONINI in the last 168 hours. BNP (last 3 results) No results for input(s): PROBNP in the last 8760 hours. HbA1C: No results for input(s): HGBA1C in the last 72 hours. CBG: Recent Labs  Lab 11/20/18 2001 11/20/18 2332 11/21/18 0345 11/21/18 0719 11/21/18 1234  GLUCAP 100* 144* 142* 142* 127*   Lipid Profile: No results for input(s): CHOL, HDL, LDLCALC, TRIG, CHOLHDL, LDLDIRECT in the last 72 hours. Thyroid Function Tests: No results for input(s): TSH, T4TOTAL, FREET4, T3FREE, THYROIDAB in the last 72 hours. Anemia Panel: No results for input(s): VITAMINB12, FOLATE, FERRITIN, TIBC,  IRON, RETICCTPCT in the last 72 hours. Urine analysis:    Component Value Date/Time   COLORURINE STRAW (A)  10/31/2018 1928   APPEARANCEUR CLEAR 10/31/2018 1928   LABSPEC 1.006 10/31/2018 1928   PHURINE 5.0 10/31/2018 1928   GLUCOSEU 50 (A) 10/31/2018 1928   HGBUR SMALL (A) 10/31/2018 1928   BILIRUBINUR NEGATIVE 10/31/2018 1928   KETONESUR 20 (A) 10/31/2018 1928   PROTEINUR NEGATIVE 10/31/2018 1928   UROBILINOGEN 0.2 12/18/2013 1402   NITRITE NEGATIVE 10/31/2018 1928   LEUKOCYTESUR NEGATIVE 10/31/2018 1928   Sepsis Labs: _0 (procalcitonin:4,lacticidven:4) ) Recent Results (from the past 240 hour(s))  Gram stain     Status: None   Collection Time: 11/16/18  9:00 PM   Specimen: Synovium; Body Fluid  Result Value Ref Range Status   Specimen Description SYNOVIAL  Final   Special Requests KNEE,JOINT  Final   Gram Stain   Final    ABUNDANT WBC PRESENT,BOTH PMN AND MONONUCLEAR NO ORGANISMS SEEN Performed at [facility], [address], [place], [place] [address]    Report Status 11/16/2018 FINAL  Final  Culture, body fluid-bottle     Status: Abnormal   Collection Time: 11/16/18  9:00 PM   Specimen: Synovium  Result Value Ref Range Status   Specimen Description   Final    SYNOVIAL Performed at [facility], [address] [place], [place] [address]    Special Requests   Final    KNEE,JOINT Performed at [facility], [address] [place], [place] [address]    Gram Stain   Final    GRAM POSITIVE COCCI AEROBIC AND ANAEROBIC Gram Stain Report Called to,Read Back By and Verified With: HARVEY N. AT [place] [id number] BY THOMPSON S. Performed at  Hospital, [address], [place], [place] [address]    Culture METHICILLIN RESISTANT STAPHYLOCOCCUS AUREUS (A)  Final   Report Status 11/21/2018 FINAL  Final   Organism ID, Bacteria METHICILLIN RESISTANT STAPHYLOCOCCUS AUREUS  Final      Susceptibility   Methicillin resistant staphylococcus aureus - MIC*    CIPROFLOXACIN >=8 RESISTANT Resistant     ERYTHROMYCIN >=8 RESISTANT Resistant     GENTAMICIN <=0.5 SENSITIVE Sensitive      OXACILLIN >=4 RESISTANT Resistant     TETRACYCLINE >=16 RESISTANT Resistant     VANCOMYCIN 1 SENSITIVE Sensitive     TRIMETH/SULFA >=320 RESISTANT Resistant     CLINDAMYCIN RESISTANT Resistant     RIFAMPIN <=0.5 SENSITIVE Sensitive     Inducible Clindamycin POSITIVE Resistant     * METHICILLIN RESISTANT STAPHYLOCOCCUS AUREUS  SARS CORONAVIRUS 2 (TAT 6-24 HRS) Nasopharyngeal Nasopharyngeal Swab     Status: None   Collection Time: 11/16/18 10:10 PM   Specimen: Nasopharyngeal Swab  Result Value Ref Range Status   SARS Coronavirus 2 NEGATIVE NEGATIVE Final    Comment: (NOTE) SARS-CoV-2 target nucleic acids are NOT DETECTED. The SARS-CoV-2 RNA is generally detectable in upper and lower respiratory specimens during the acute phase of infection. Negative results do not preclude SARS-CoV-2 infection, do not rule out co-infections with other pathogens, and should not be used as the sole basis for treatment or other patient management decisions. Negative results must be combined with clinical observations, patient history, and epidemiological information. The expected result is Negative. Fact Sheet for Patients: SugarRoll.be Fact Sheet for Healthcare Providers: https://www.woods-mathews.com/ This test is not yet approved or cleared by the [place] FDA and  has been authorized for detection and/or diagnosis of SARS-CoV-2 by FDA under  an Emergency Use Authorization (EUA). This EUA will remain  in effect (meaning this test can be used) for the duration of the COVID-19 declaration under Section 56 4(b)(1) of the Act, 21 U.S.C. section 360bbb-3(b)(1), unless the authorization is terminated or revoked sooner. Performed at [facility], [place] [address], [place], [place] [address]   Culture, blood (Routine X 2) w Reflex to ID Panel     Status: Abnormal (Preliminary result)   Collection Time: 11/16/18 11:16 PM   Specimen: BLOOD  Result Value  Ref Range Status   Specimen Description   Final    BLOOD RIGHT ANTECUBITAL Performed at [facility], [address], [place], [place] [address]    Special Requests   Final    BOTTLES DRAWN AEROBIC AND ANAEROBIC Blood Culture adequate volume Performed at [facility], [address], [place], [place] [address]    Culture  Setup Time   Final    BOTH AEROBIC AND ANAEROBIC GRAM POSITIVE COCCI Gram Stain Report Called to,Read Back By and Verified With: K DULIL,RN _0  11/18/18 MKELLY Performed at [facility], [address], [place], [place] [address]    Culture METHICILLIN RESISTANT STAPHYLOCOCCUS AUREUS (A)  Final   Report Status PENDING  Incomplete   Organism ID, Bacteria METHICILLIN RESISTANT STAPHYLOCOCCUS AUREUS  Final      Susceptibility   Methicillin resistant staphylococcus aureus - MIC*    CIPROFLOXACIN >=8 RESISTANT Resistant     ERYTHROMYCIN >=8 RESISTANT Resistant     GENTAMICIN <=0.5 SENSITIVE Sensitive     OXACILLIN >=4 RESISTANT Resistant     TETRACYCLINE >=16 RESISTANT Resistant     VANCOMYCIN 1 SENSITIVE Sensitive     TRIMETH/SULFA >=320 RESISTANT Resistant     CLINDAMYCIN RESISTANT Resistant     RIFAMPIN <=0.5 SENSITIVE Sensitive     Inducible Clindamycin POSITIVE Resistant     * METHICILLIN RESISTANT STAPHYLOCOCCUS AUREUS  Culture, blood (Routine X 2) w Reflex to ID Panel     Status: None   Collection Time: 11/16/18 11:18 PM   Specimen: BLOOD RIGHT HAND  Result Value Ref Range Status   Specimen Description BLOOD RIGHT HAND  Final   Special Requests   Final    BOTTLES DRAWN AEROBIC AND ANAEROBIC Blood Culture adequate volume   Culture   Final    NO GROWTH 5 DAYS Performed at [facility], [address] [place], [place] [address]    Report Status 11/21/2018 FINAL  Final  Surgical pcr screen     Status: None   Collection Time: 11/17/18  2:59 PM   Specimen: Nasal Mucosa; Nasal Swab  Result Value Ref Range Status   MRSA, PCR NEGATIVE NEGATIVE Final    Staphylococcus aureus NEGATIVE NEGATIVE Final    Comment: (NOTE) The Xpert SA Assay (FDA approved for NASAL specimens in patients 32 years of age and older), is one component of a comprehensive surveillance program. It is not intended to diagnose infection nor to guide or monitor treatment. Performed at [facility], [place] [address] [place], [place] [address]   Aerobic/Anaerobic Culture (surgical/deep wound)     Status: None (Preliminary result)   Collection Time: 11/18/18 10:06 AM   Specimen: PATH Soft tissue  Result Value Ref Range Status   Specimen Description TISSUE  Final   Special Requests NONE  Final   Gram Stain   Final    ABUNDANT WBC PRESENT, PREDOMINANTLY PMN RARE GRAM POSITIVE COCCI Performed at [facility]  [address], [place], [place] [address]    Culture   Final    FEW METHICILLIN RESISTANT STAPHYLOCOCCUS AUREUS NO ANAEROBES ISOLATED; CULTURE IN PROGRESS FOR 5 DAYS    Report Status PENDING  Incomplete   Organism ID, Bacteria METHICILLIN RESISTANT STAPHYLOCOCCUS AUREUS  Final      Susceptibility   Methicillin resistant staphylococcus aureus - MIC*    CIPROFLOXACIN >=8 RESISTANT Resistant     ERYTHROMYCIN >=8 RESISTANT Resistant     GENTAMICIN <=0.5 SENSITIVE Sensitive     OXACILLIN >=4 RESISTANT Resistant     TETRACYCLINE >=16 RESISTANT Resistant     VANCOMYCIN 1 SENSITIVE Sensitive     TRIMETH/SULFA >=320 RESISTANT Resistant     CLINDAMYCIN RESISTANT Resistant     RIFAMPIN <=0.5 SENSITIVE Sensitive     Inducible Clindamycin POSITIVE Resistant     * FEW METHICILLIN RESISTANT STAPHYLOCOCCUS AUREUS  Acid Fast Smear (AFB)     Status: None   Collection Time: 11/18/18 10:06 AM   Specimen: PATH Soft tissue  Result Value Ref Range Status   AFB Specimen Processing Concentration  Final   Acid Fast Smear Negative  Final    Comment: (NOTE) Performed At: [facility] [address] [place], [place] [id number] Rush Farmer MD [id number]     Source (AFB) TISSUE  Final    Comment: Performed at [facility], [place] [address], [place], [place] [address]         Radiology Studies: No results found.    Scheduled Meds: . docusate sodium  100 mg Oral BID  . enoxaparin (LOVENOX) injection  40 mg Subcutaneous Q24H  . ferrous sulfate  325 mg Oral BID WC  . insulin aspart  0-9 Units Subcutaneous TID WC  . nicotine  21 mg Transdermal Daily  . oxyCODONE  5 mg Oral Q4H  . pentoxifylline  400 mg Oral TID WC  . polyethylene glycol  17 g Oral BID  . rosuvastatin  20 mg Oral Daily  . senna-docusate  2 tablet Oral QHS  . vitamin B-12  1,000 mcg Oral Daily  . zolpidem  5 mg Oral QHS   Continuous Infusions: . vancomycin       LOS: 5 days      Debbe Odea, MD [facility] Pager: [email or web address] Password [email or web address] 11/21/2018, 3:02 PM

## 2018-11-21 NOTE — Progress Notes (Signed)
t max 99.2 afebrile now. Dressing CDI. Hemovac empty. Distal  Pule 2+. Pain controlled . Plan for OR Wed

## 2018-11-21 NOTE — Care Management Important Message (Signed)
Important Message  Patient Details  Name: [name] MRN: [id number] Date of Birth: 02-03-1965   Medicare Important Message Given:  Yes     Memory Argue 11/21/2018, 3:55 PM

## 2018-11-21 NOTE — Progress Notes (Signed)
Pharmacy Antibiotic Note  [name] is a 53 y.o. male admitted on 11/16/2018 with Knee infection.  Pharmacy has been consulted for Vancomycin dosing.   ID: septic R knee, MRSA bacteremia. Tmax 99.5. WBC 10.6 down. Scr 0.84 down. - Has some small healing foot ulcers  10/29 Vancomycin x1,  10/31>> 10/28 Cefepime >> 10/29 10/29 Cefazolin >> 11/1  10/13 MRSA PCR positive 10/29 MRSA PCR neg 10/28 BCx: MRSA 10/28 Synovial fluid: staph aureus (reinbubated) 10/30 AFB neg, Cx sent 10/30 wound cx MRSA  Vancomycin 1500 mg IV Q 12 hrs. Goal AUC 400-550. Expected AUC: 507.7 SCr used: 0.84  Plan: Increase Vancomycin to 1500mg  IV q 12h Dr Sharol Given plans to take back to OR to remove hardware  Will need TEE After hardware removed   Height: 6\' 3"  (190.5 cm) Weight: 175 lb 11.3 oz (79.7 kg) IBW/kg (Calculated) : 84.5  Temp (24hrs), Avg:98.7 F (37.1 C), Min:98.2 F (36.8 C), Max:99.5 F (37.5 C)  Recent Labs  Lab 11/16/18 2038 11/17/18 0745 11/18/18 0315 11/19/18 0201 11/21/18 0348  WBC 15.8* 13.8* 12.6* 11.4* 10.6*  CREATININE 1.00 1.02 1.06 1.00 0.84    Estimated Creatinine Clearance: 112 mL/min (by C-G formula based on SCr of 0.84 mg/dL).    Allergies  Allergen Reactions  . Benadryl [Diphenhydramine Hcl] Other (See Comments)    Leg spasms   . Trazodone And Nefazodone Other (See Comments)    Leg spasms     Nolberto Cheuvront S. Alford Highland, PharmD, BCPS Clinical Staff Pharmacist Eilene Ghazi Stillinger 11/21/2018 10:12 AM

## 2018-11-22 ENCOUNTER — Inpatient Hospital Stay: Payer: Self-pay

## 2018-11-22 DIAGNOSIS — Z9660 Presence of unspecified orthopedic joint implant: Secondary | ICD-10-CM

## 2018-11-22 LAB — GLUCOSE, CAPILLARY
Glucose-Capillary: 135 mg/dL — ABNORMAL HIGH (ref 70–99)
Glucose-Capillary: 142 mg/dL — ABNORMAL HIGH (ref 70–99)
Glucose-Capillary: 149 mg/dL — ABNORMAL HIGH (ref 70–99)
Glucose-Capillary: 151 mg/dL — ABNORMAL HIGH (ref 70–99)
Glucose-Capillary: 155 mg/dL — ABNORMAL HIGH (ref 70–99)
Glucose-Capillary: 191 mg/dL — ABNORMAL HIGH (ref 70–99)

## 2018-11-22 NOTE — Progress Notes (Signed)
Spoke with Silverio Lay, RN regarding PICC order.  No written approval from Infectious Disease to place line yet, therefore VAST will wait to place PICC.  Will go ahead and get consent so that once this approval is given VAST and patient will be ready for placement.

## 2018-11-22 NOTE — Progress Notes (Signed)
Physical Therapy Treatment Patient Details Name: [name] MRN: [id number] DOB: [birth year] Today's Date: 11/22/2018    History of Present Illness Pt is a 53 y/o male admitted secondary to R knee pain. Pt is s/p irrigation and debridement right total knee arthroplasty arthroscopically. Cultures were found to have gram-positive cocci in all. Plan for surgery on 11/4 to remove total knee with placement of an anatomic antibiotic spacer. 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 Pt is going for surgery tomorrow for TKA revision and antibiotic spacer. Pt agreeable to work on R LE strengthening and ROM this session as detailed below. Pt performed bed mobility from supine<>sitting with min guard assist for R LE management, pt seated EOB x 8 minutes while performing exercises. Pt completed the rest of his exercises while supine. Will continue to follow acutely in order to assess and progress mobility and gait following surgery, at this time continue to recommend home with home health therapy to maximize independence with functional mobility.   Follow Up Recommendations  Home health PT;Supervision/Assistance - 24 hour     Equipment Recommendations  None recommended by PT    Recommendations for Other Services       Precautions / Restrictions Precautions Precautions: Fall Precaution Comments: R knee drain Required Braces or Orthoses: Other Brace Other Brace: wrist splint to L  Restrictions Weight Bearing Restrictions: No RLE Weight Bearing: Weight bearing as tolerated    Mobility  Bed Mobility Overal bed mobility: Needs Assistance Bed Mobility: Sit to Supine;Supine to Sit     Supine to sit: Min guard Sit to supine: Min guard   General bed mobility comments: Pt performed bed mobilit this session with min guard assist, cues for  techniques and use of bedrails  Transfers Overall transfer level: (pt unable to stand this session secondary to R knee pain.)                  Ambulation/Gait                 Stairs             Wheelchair Mobility    Modified Rankin (Stroke Patients Only)       Balance Overall balance assessment: Needs assistance Sitting-balance support: No upper extremity supported;Feet supported Sitting balance-Leahy Scale: Fair Sitting balance - Comments: sitting balance EOB this session with supervision while performing seated therex                                    Cognition Arousal/Alertness: Awake/alert Behavior During Therapy: WFL for tasks assessed/performed Overall Cognitive Status: Within Functional Limits for tasks assessed                                        Exercises General Exercises - Lower Extremity Ankle Circles/Pumps: AROM;Both;Seated;10 reps Quad Sets: 10 reps;Right;Supine Long Arc Quad: AROM;10 reps;Right;Seated Heel Slides: AROM;10 reps;Right;Supine Hip ABduction/ADduction: AROM;10 reps;Right;Supine Straight Leg Raises: AROM;10 reps;Right;Supine Hip Flexion/Marching: AROM;Seated;Right;5 reps    General Comments        Pertinent Vitals/Pain Pain Assessment: Faces Faces Pain Scale: Hurts even more Pain Location: R knee Pain Descriptors /  Indicators: Guarding;Grimacing Pain Intervention(s): Limited activity within patient's tolerance;Monitored during session;Repositioned    Home Living                      Prior Function            PT Goals (current goals can now be found in the care plan section) Progress towards PT goals: Progressing toward goals    Frequency    Min 3X/week      PT Plan Current plan remains appropriate    Co-evaluation              AM-PAC PT "6 Clicks" Mobility   Outcome Measure  Help needed turning from your back to your side while in a flat bed  without using bedrails?: A Little Help needed moving from lying on your back to sitting on the side of a flat bed without using bedrails?: A Little Help needed moving to and from a bed to a chair (including a wheelchair)?: A Little Help needed standing up from a chair using your arms (e.g., wheelchair or bedside chair)?: A Little Help needed to walk in hospital room?: A Little Help needed climbing 3-5 steps with a railing? : A Lot 6 Click Score: 17    End of Session Equipment Utilized During Treatment: Gait belt Activity Tolerance: Patient limited by pain Patient left: with call bell/phone within reach;in bed;with bed alarm set Nurse Communication: Mobility status PT Visit Diagnosis: Other abnormalities of gait and mobility (R26.89);Pain Pain - Right/Left: Right Pain - part of body: Knee     Time: [id number] PT Time Calculation (min) (ACUTE ONLY): 14 min  Charges:  $Therapeutic Exercise: 8-22 mins                     Netta Corrigan, PT, DPT, CSRS Acute Rehab Office [place] 11/22/2018, 4:38 PM

## 2018-11-22 NOTE — Anesthesia Preprocedure Evaluation (Addendum)
Anesthesia Evaluation  Patient identified by MRN, date of birth, ID band Patient awake    Reviewed: Allergy & Precautions, NPO status , Patient's Chart, lab work & pertinent test results  History of Anesthesia Complications Negative for: history of anesthetic complications  Airway Mallampati: II  TM Distance: >3 FB Neck ROM: Full    Dental  (+) Upper Dentures, Lower Dentures   Pulmonary Current Smoker,    Pulmonary exam normal        Cardiovascular negative cardio ROS Normal cardiovascular exam  TTE 11/17/18: EF 70-75%    Neuro/Psych Anxiety Depression CVA, No Residual Symptoms negative psych ROS   GI/Hepatic GERD  ,(+)     substance abuse  alcohol use and cocaine use,   Endo/Other  diabetes, Type 2  Renal/GU negative Renal ROS  negative genitourinary   Musculoskeletal  (+) Arthritis , narcotic dependentRight knee PJI   Abdominal   Peds  Hematology  (+) anemia , Hgb 10.1   Anesthesia Other Findings Day of surgery medications reviewed with patient.  Reproductive/Obstetrics negative OB ROS                            Anesthesia Physical Anesthesia Plan  ASA: III  Anesthesia Plan: General   Post-op Pain Management:    Induction: Intravenous  PONV Risk Score and Plan: 2 and Treatment may vary due to age or medical condition, Ondansetron, Midazolam and Dexamethasone  Airway Management Planned: Oral ETT  Additional Equipment: None  Intra-op Plan:   Post-operative Plan: Extubation in OR  Informed Consent: I have reviewed the patients History and Physical, chart, labs and discussed the procedure including the risks, benefits and alternatives for the proposed anesthesia with the patient or authorized representative who has indicated his/her understanding and acceptance.     Dental advisory given  Plan Discussed with: CRNA  Anesthesia Plan Comments:        Anesthesia  Quick Evaluation

## 2018-11-22 NOTE — Progress Notes (Signed)
PROGRESS NOTE    [name]   PQZ:[id number]  DOB: [birth year]  DOA: 11/16/2018 PCP: Lucia Gaskins, MD   Brief Narrative:  [name] is a 53 y/o male with DM2, CVA. 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 He drove to [place] last weekend to get his son and on the drive back he noted the swelling in his knee which extended down to his foot along with redness. He had pain as well. He did not not fevers or chills. He does not recall any injury to the knee. He made an appt with Dr Sharol Given on 10/28 for later in the week but then decided to come to the ED. He was last admitted 10/12-10/13 when he was brought in unresponsive due to overdose of Oxycodone.    In the ED, the knee was aspirated and he was stared on IV antibiotics for possible septic arthritis. Knee aspirated again on 10/30 in OR. Found to be MRSA +. On Vancomycin. Will go to OR tomorrow to remove hardware and get antibiotic spacer. Will go home with IV Vanc for 6 wks.     Subjective: No complaints today.     Assessment & Plan:   Principal Problem:   Septic right knee joint -  MRSA bacteremia SIRS with tachycardia and leukocytosis - ? If MRSA bacteremia due to drug use- he has healing ulcers on feet which are less likely to be point of entry - WBC 15.8, ESR 120 - in ED > 85 cc of fluid aspirated - cell count 16,050, 93% neutrophils no organisms on gram stain and no crystals - 10/30 > s/p removal of 100 cc of pus like material - also has grown out MRSA  - Dr Sharol Given plans to take back to OR tomorrow to remove hardware - cont pain control> Oxycodone and Dilaudid Q 4 PRN-  - blood cultures + for GPC- ID  autoconsulted-     - started on Vanc and Cefepime in ED- blood culture grew out G + cocci- changed to Vanc and Ancef- now on solely Vanc - awaiting surgery on Wed- he plans to go [facility] with [facility] -  he has done IV antibiotics at home - no need for TEE per Dr Linus Salmons- OK to put in order for PICC  Active Problems:  Insomnia - added Ambien      Diabetes mellitus type 2 in nonobese  - A1c is 6.4 - Holding Metformin- cont SSI for now    Hypokalemia - replaced    Anemia, microcytic -check anemia panel- Iron saturation low- Ferritin normal but this may be an acute phase reactant- started oral Iron replacement B12 low normal- started B12 replacement as well  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  Ulcer on left foot plantar aspect and small healing ulcer on right foot - Dr Sharol Given following this as outpt-   Nicotine abuse - cont Nicotine patch-   Left arm numbness and tingling - has been going on since at least Sept- MRI brain negative - received a trial of Prednisone of this at ortho office- He does not feel it is much better- apparently there is a plan for an outpt EMG  Overdose on Oxycodone, Benzo and Cocaine a few wks ago He did have a prescription for Oxycodone- He has Benzodiazepine in his urine although his med rec did not show he was prescribed any -  currently UDS on 10/31 > opiate and Benzo +- opiates being given in the hospital - his UDS has been found to be cocaine positive for many years but is negative currently  Time spent in minutes: 35 DVT prophylaxis: Lovenox Code Status: Full code Family Communication:  Disposition Plan: per ortho Consultants:   ortho Procedures:  2 D ECHO 1. Left ventricular ejection fraction, by visual estimation, is 70 to 75%. The left ventricle has hyperdynamic function. There is mildly increased left ventricular hypertrophy.  2. Indeterminate diastolic filling due to E-A fusion.  3. The left ventricle has no regional wall motion abnormalities.  4. Global right ventricle has normal systolic function.The right ventricular size is normal. No increase in right ventricular wall thickness.  5. Left atrial size was normal.   6. Right atrial size was normal.  7. Mild thickening of the anterior and posterior mitral valve leaflet(s).  8. The mitral valve is grossly normal. No evidence of mitral valve regurgitation. No evidence of mitral stenosis.  9. The tricuspid valve is grossly normal. Tricuspid valve regurgitation is not demonstrated. 10. The aortic valve is tricuspid. Aortic valve regurgitation is not visualized. No evidence of aortic valve sclerosis or stenosis. 11. The pulmonic valve was grossly normal. Pulmonic valve regurgitation is not visualized. 12. TR signal is inadequate for assessing pulmonary artery systolic pressure. 13. The inferior vena cava is normal in size with greater than 50% respiratory variability, suggesting right atrial pressure of 3 mmHg.  Antimicrobials:  Anti-infectives (From admission, onward)   Start     Dose/Rate Route Frequency Ordered Stop   11/21/18 1600  vancomycin (VANCOCIN) 1,500 mg in sodium chloride 0.9 % 500 mL IVPB     1,500 mg 250 mL/hr over 120 Minutes Intravenous Every 12 hours 11/21/18 1012     11/20/18 0400  vancomycin (VANCOCIN) 1,250 mg in sodium chloride 0.9 % 250 mL IVPB  Status:  Discontinued     1,250 mg 166.7 mL/hr over 90 Minutes Intravenous Every 12 hours 11/19/18 1509 11/21/18 1012   11/19/18 1600  vancomycin (VANCOCIN) 2,000 mg in sodium chloride 0.9 % 500 mL IVPB     2,000 mg 250 mL/hr over 120 Minutes Intravenous  Once 11/19/18 1509 11/19/18 1816   11/18/18 0600  ceFAZolin (ANCEF) IVPB 2g/100 mL premix  Status:  Discontinued     2 g 200 mL/hr over 30 Minutes Intravenous On call to O.R. 11/17/18 2043 11/18/18 1052   11/17/18 1400  ceFAZolin (ANCEF) IVPB 2g/100 mL premix  Status:  Discontinued     2 g 200 mL/hr over 30 Minutes Intravenous Every 8 hours 11/17/18 1008 11/20/18 1307   11/17/18 1200  vancomycin (VANCOCIN) 1,250 mg in sodium chloride 0.9 % 250 mL IVPB  Status:  Discontinued     1,250 mg 166.7 mL/hr over 90 Minutes Intravenous Every 12  hours 11/17/18 0204 11/17/18 0730   11/17/18 0800  ceFEPIme (MAXIPIME) 2 g in sodium chloride 0.9 % 100 mL IVPB  Status:  Discontinued     2 g 200 mL/hr over 30 Minutes Intravenous Every 8 hours 11/17/18 0204 11/17/18 0730   11/17/18 0115  vancomycin (VANCOCIN) IVPB 1000 mg/200 mL premix     1,000 mg 200 mL/hr over 60 Minutes Intravenous  Once 11/17/18 0000 11/17/18 0254   11/17/18 0015  vancomycin (VANCOCIN) IVPB 1000 mg/200 mL premix     1,000 mg 200 mL/hr over 60 Minutes Intravenous  Once 11/17/18 0000 11/17/18 0153   11/16/18 2330  vancomycin (VANCOCIN)  2,000 mg in sodium chloride 0.9 % 500 mL IVPB  Status:  Discontinued     2,000 mg 250 mL/hr over 120 Minutes Intravenous  Once 11/16/18 2319 11/17/18 0000   11/16/18 2330  ceFEPIme (MAXIPIME) 2 g in sodium chloride 0.9 % 100 mL IVPB     2 g 200 mL/hr over 30 Minutes Intravenous  Once 11/16/18 2319 11/17/18 0045       Objective: Vitals:   11/21/18 0521 11/21/18 1343 11/21/18 2012 11/22/18 0406  BP: (!) 144/78 121/71 123/74 132/77  Pulse: 88 80 86 82  Resp:  '18 18 17  '$ Temp:  99 F (37.2 C) 98.4 F (36.9 C) 98.2 F (36.8 C)  TempSrc:  Oral Oral Oral  SpO2:  100% 99% 99%  Weight:      Height:        Intake/Output Summary (Last 24 hours) at 11/22/2018 1557 Last data filed at 11/22/2018 0404 Gross per 24 hour  Intake 120 ml  Output 2950 ml  Net -2830 ml   Filed Weights   11/19/18 0500 11/20/18 0500 11/21/18 0500  Weight: 83.5 kg 84.3 kg 79.7 kg    Examination: General exam: Appears comfortable  HEENT: PERRLA, oral mucosa moist, no sclera icterus or thrush Respiratory system: Clear to auscultation. Respiratory effort normal. Cardiovascular system: S1 & S2 heard,  No murmurs  Gastrointestinal system: Abdomen soft, non-tender, nondistended. Normal bowel sounds   Central nervous system: Alert and oriented. No focal neurological deficits. Extremities: No cyanosis, clubbing - right knee in dressing with drain- right foot  with transmetatarsal amputation is swollen Psychiatry:  Mood & affect appropriate.  Skin:    Left foot     Data Reviewed: I have personally reviewed following labs and imaging studies  CBC: Recent Labs  Lab 11/16/18 2038 11/17/18 0745 11/18/18 0315 11/19/18 0201 11/21/18 0348  WBC 15.8* 13.8* 12.6* 11.4* 10.6*  NEUTROABS 12.8*  --   --   --   --   HGB 11.5* 10.0* 10.0* 9.6* 10.1*  HCT 36.4* 29.9* 30.7* 29.2* 30.7*  MCV 82.0 79.5* 80.4 79.1* 78.7*  PLT 429* 325 291 284 003   Basic Metabolic Panel: Recent Labs  Lab 11/16/18 2038 11/17/18 0745 11/18/18 0315 11/19/18 0201 11/21/18 0348  NA 138 136 133* 133* 132*  K 3.2* 3.1* 3.6 3.9 3.8  CL 104 105 100 99 98  CO2 22 21* 20* 21* 23  GLUCOSE 206* 118* 126* 153* 148*  BUN '20 15 16 16 15  '$ CREATININE 1.00 1.02 1.06 1.00 0.84  CALCIUM 9.6 8.5* 9.1 8.9 8.6*   GFR: Estimated Creatinine Clearance: 112 mL/min (by C-G formula based on SCr of 0.84 mg/dL). Liver Function Tests: Recent Labs  Lab 11/16/18 2038  AST 16  ALT 30  ALKPHOS 227*  BILITOT 0.6  PROT 8.2*  ALBUMIN 3.1*   No results for input(s): LIPASE, AMYLASE in the last 168 hours. No results for input(s): AMMONIA in the last 168 hours. Coagulation Profile: No results for input(s): INR, PROTIME in the last 168 hours. Cardiac Enzymes: No results for input(s): CKTOTAL, CKMB, CKMBINDEX, TROPONINI in the last 168 hours. BNP (last 3 results) No results for input(s): PROBNP in the last 8760 hours. HbA1C: No results for input(s): HGBA1C in the last 72 hours. CBG: Recent Labs  Lab 11/21/18 2009 11/22/18 0005 11/22/18 0403 11/22/18 0803 11/22/18 1205  GLUCAP 137* 151* 149* 142* 191*   Lipid Profile: No results for input(s): CHOL, HDL, LDLCALC, TRIG, CHOLHDL, LDLDIRECT in the  last 72 hours. Thyroid Function Tests: No results for input(s): TSH, T4TOTAL, FREET4, T3FREE, THYROIDAB in the last 72 hours. Anemia Panel: No results for input(s): VITAMINB12,  FOLATE, FERRITIN, TIBC, IRON, RETICCTPCT in the last 72 hours. Urine analysis:    Component Value Date/Time   COLORURINE STRAW (A) 10/31/2018 1928   APPEARANCEUR CLEAR 10/31/2018 1928   LABSPEC 1.006 10/31/2018 1928   PHURINE 5.0 10/31/2018 1928   GLUCOSEU 50 (A) 10/31/2018 1928   HGBUR SMALL (A) 10/31/2018 1928   BILIRUBINUR NEGATIVE 10/31/2018 1928   KETONESUR 20 (A) 10/31/2018 1928   PROTEINUR NEGATIVE 10/31/2018 1928   UROBILINOGEN 0.2 12/18/2013 1402   NITRITE NEGATIVE 10/31/2018 1928   LEUKOCYTESUR NEGATIVE 10/31/2018 1928   Sepsis Labs: '@LABRCNTIP'$ (procalcitonin:4,lacticidven:4) ) Recent Results (from the past 240 hour(s))  Gram stain     Status: None   Collection Time: 11/16/18  9:00 PM   Specimen: Synovium; Body Fluid  Result Value Ref Range Status   Specimen Description SYNOVIAL  Final   Special Requests KNEE,JOINT  Final   Gram Stain   Final    ABUNDANT WBC PRESENT,BOTH PMN AND MONONUCLEAR NO ORGANISMS SEEN Performed at [facility], [address] [place], [place] [address]    Report Status 11/16/2018 FINAL  Final  Culture, body fluid-bottle     Status: Abnormal   Collection Time: 11/16/18  9:00 PM   Specimen: Synovium  Result Value Ref Range Status   Specimen Description   Final    SYNOVIAL Performed at [facility], [address] [place], [place] [address]    Special Requests   Final    KNEE,JOINT Performed at [facility], [address], [place], [place] [address]    Gram Stain   Final    GRAM POSITIVE COCCI AEROBIC AND ANAEROBIC Gram Stain Report Called to,Read Back By and Verified With: HARVEY N. AT [place] [id number] BY THOMPSON S. Performed at [facility], [address], [place], [place] [address]    Culture METHICILLIN RESISTANT STAPHYLOCOCCUS AUREUS (A)  Final   Report Status 11/21/2018 FINAL  Final   Organism ID, Bacteria METHICILLIN RESISTANT STAPHYLOCOCCUS AUREUS  Final      Susceptibility   Methicillin resistant staphylococcus  aureus - MIC*    CIPROFLOXACIN >=8 RESISTANT Resistant     ERYTHROMYCIN >=8 RESISTANT Resistant     GENTAMICIN <=0.5 SENSITIVE Sensitive     OXACILLIN >=4 RESISTANT Resistant     TETRACYCLINE >=16 RESISTANT Resistant     VANCOMYCIN 1 SENSITIVE Sensitive     TRIMETH/SULFA >=320 RESISTANT Resistant     CLINDAMYCIN RESISTANT Resistant     RIFAMPIN <=0.5 SENSITIVE Sensitive     Inducible Clindamycin POSITIVE Resistant     * METHICILLIN RESISTANT STAPHYLOCOCCUS AUREUS  SARS CORONAVIRUS 2 (TAT 6-24 HRS) Nasopharyngeal Nasopharyngeal Swab     Status: None   Collection Time: 11/16/18 10:10 PM   Specimen: Nasopharyngeal Swab  Result Value Ref Range Status   SARS Coronavirus 2 NEGATIVE NEGATIVE Final    Comment: (NOTE) SARS-CoV-2 target nucleic acids are NOT DETECTED. The SARS-CoV-2 RNA is generally detectable in upper and lower respiratory specimens during the acute phase of infection. Negative results do not preclude SARS-CoV-2 infection, do not rule out co-infections with other pathogens, and should not be used as the sole basis for treatment or other patient management decisions. Negative results must be combined with clinical observations, patient history, and epidemiological information. The expected result is Negative. Fact Sheet for Patients: SugarRoll.be Fact Sheet for  Healthcare Providers: https://www.woods-mathews.com/ This test is not yet approved or cleared by the [place] and  has been authorized for detection and/or diagnosis of SARS-CoV-2 by FDA under an Emergency Use Authorization (EUA). This EUA will remain  in effect (meaning this test can be used) for the duration of the COVID-19 declaration under Section 56 4(b)(1) of the Act, 21 U.S.C. section 360bbb-3(b)(1), unless the authorization is terminated or revoked sooner. Performed at [facility], [place] [address] [place], [place] [address]   Culture, blood  (Routine X 2) w Reflex to ID Panel     Status: Abnormal (Preliminary result)   Collection Time: 11/16/18 11:16 PM   Specimen: BLOOD  Result Value Ref Range Status   Specimen Description   Final    BLOOD RIGHT ANTECUBITAL Performed at [facility], [address] [place], [place] [address]    Special Requests   Final    BOTTLES DRAWN AEROBIC AND ANAEROBIC Blood Culture adequate volume Performed at [facility], [address], [place], [place] [address]    Culture  Setup Time   Final    BOTH AEROBIC AND ANAEROBIC GRAM POSITIVE COCCI Gram Stain Report Called to,Read Back By and Verified With: K DULIL,RN '@0400'$  11/18/18 MKELLY Performed at [facility], [address], [place], [place] [address]    Culture METHICILLIN RESISTANT STAPHYLOCOCCUS AUREUS (A)  Final   Report Status PENDING  Incomplete   Organism ID, Bacteria METHICILLIN RESISTANT STAPHYLOCOCCUS AUREUS  Final      Susceptibility   Methicillin resistant staphylococcus aureus - MIC*    CIPROFLOXACIN >=8 RESISTANT Resistant     ERYTHROMYCIN >=8 RESISTANT Resistant     GENTAMICIN <=0.5 SENSITIVE Sensitive     OXACILLIN >=4 RESISTANT Resistant     TETRACYCLINE >=16 RESISTANT Resistant     VANCOMYCIN 1 SENSITIVE Sensitive     TRIMETH/SULFA >=320 RESISTANT Resistant     CLINDAMYCIN RESISTANT Resistant     RIFAMPIN <=0.5 SENSITIVE Sensitive     Inducible Clindamycin POSITIVE Resistant     * METHICILLIN RESISTANT STAPHYLOCOCCUS AUREUS  Culture, blood (Routine X 2) w Reflex to ID Panel     Status: None   Collection Time: 11/16/18 11:18 PM   Specimen: BLOOD RIGHT HAND  Result Value Ref Range Status   Specimen Description BLOOD RIGHT HAND  Final   Special Requests   Final    BOTTLES DRAWN AEROBIC AND ANAEROBIC Blood Culture adequate volume   Culture   Final    NO GROWTH 5 DAYS Performed at [facility], [address] [place], [place] [address]    Report Status 11/21/2018 FINAL  Final  Surgical pcr screen     Status: None    Collection Time: 11/17/18  2:59 PM   Specimen: Nasal Mucosa; Nasal Swab  Result Value Ref Range Status   MRSA, PCR NEGATIVE NEGATIVE Final   Staphylococcus aureus NEGATIVE NEGATIVE Final    Comment: (NOTE) The Xpert SA Assay (FDA approved for NASAL specimens in patients 20 years of age and older), is one component of a comprehensive surveillance program. It is not intended to diagnose infection nor to guide or monitor treatment. Performed at [facility], [place] [address] [place], [place] [address]   Aerobic/Anaerobic Culture (surgical/deep wound)     Status: None (Preliminary result)   Collection Time: 11/18/18 10:06 AM   Specimen: PATH Soft tissue  Result Value Ref Range Status   Specimen Description TISSUE  Final   Special Requests NONE  Final   Gram Stain   Final    ABUNDANT WBC PRESENT, PREDOMINANTLY PMN RARE GRAM POSITIVE COCCI Performed at [facility], [place] [address] [place], [place] [address]    Culture   Final    FEW METHICILLIN RESISTANT STAPHYLOCOCCUS AUREUS NO ANAEROBES ISOLATED; CULTURE IN PROGRESS FOR 5 DAYS    Report Status PENDING  Incomplete   Organism ID, Bacteria METHICILLIN RESISTANT STAPHYLOCOCCUS AUREUS  Final      Susceptibility   Methicillin resistant staphylococcus aureus - MIC*    CIPROFLOXACIN >=8 RESISTANT Resistant     ERYTHROMYCIN >=8 RESISTANT Resistant     GENTAMICIN <=0.5 SENSITIVE Sensitive     OXACILLIN >=4 RESISTANT Resistant     TETRACYCLINE >=16 RESISTANT Resistant     VANCOMYCIN 1 SENSITIVE Sensitive     TRIMETH/SULFA >=320 RESISTANT Resistant     CLINDAMYCIN RESISTANT Resistant     RIFAMPIN <=0.5 SENSITIVE Sensitive     Inducible Clindamycin POSITIVE Resistant     * FEW METHICILLIN RESISTANT STAPHYLOCOCCUS AUREUS  Acid Fast Smear (AFB)     Status: None   Collection Time: 11/18/18 10:06 AM   Specimen: PATH Soft tissue  Result Value Ref Range Status   AFB Specimen Processing Concentration  Final   Acid Fast Smear  Negative  Final    Comment: (NOTE) Performed At: [facility] [address] [place], [place] [id number] Rush Farmer MD RC:[id number]    Source (AFB) TISSUE  Final    Comment: Performed at [facility], [place] [address], [place], [place] [address]         Radiology Studies: No results found.    Scheduled Meds: . docusate sodium  100 mg Oral BID  . enoxaparin (LOVENOX) injection  40 mg Subcutaneous Q24H  . ferrous sulfate  325 mg Oral BID WC  . insulin aspart  0-9 Units Subcutaneous TID WC  . nicotine  21 mg Transdermal Daily  . oxyCODONE  5 mg Oral Q4H  . pentoxifylline  400 mg Oral TID WC  . polyethylene glycol  17 g Oral BID  . rosuvastatin  20 mg Oral Daily  . senna-docusate  2 tablet Oral QHS  . vitamin B-12  1,000 mcg Oral Daily  . zolpidem  5 mg Oral QHS   Continuous Infusions: . vancomycin 1,500 mg (11/22/18 1539)     LOS: 6 days      Debbe Odea, MD [facility] Pager: [email or web address] [facility] 11/22/2018, 3:57 PM

## 2018-11-22 NOTE — Progress Notes (Signed)
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  He will need 6 weeks of IV vancomycin after the removal of the implant.   Will continue to follow Thayer Headings, MD

## 2018-11-22 NOTE — Progress Notes (Signed)
Consent obtained for PICC and placed on chart.

## 2018-11-23 ENCOUNTER — Encounter (HOSPITAL_COMMUNITY)
Admission: EM | Disposition: A | Discharge status: Home Health | Payer: Self-pay | Source: Home / Self Care | Attending: Internal Medicine

## 2018-11-23 ENCOUNTER — Inpatient Hospital Stay (HOSPITAL_COMMUNITY): Payer: Medicare Other | Admitting: Anesthesiology

## 2018-11-23 ENCOUNTER — Encounter (HOSPITAL_COMMUNITY): Payer: Self-pay | Admitting: *Deleted

## 2018-11-23 ENCOUNTER — Other Ambulatory Visit: Payer: Self-pay | Admitting: Physician Assistant

## 2018-11-23 ENCOUNTER — Inpatient Hospital Stay: Payer: Self-pay

## 2018-11-23 DIAGNOSIS — M00061 Staphylococcal arthritis, right knee: Secondary | ICD-10-CM

## 2018-11-23 DIAGNOSIS — G8929 Other chronic pain: Secondary | ICD-10-CM

## 2018-11-23 DIAGNOSIS — T8450XA Infection and inflammatory reaction due to unspecified internal joint prosthesis, initial encounter: Secondary | ICD-10-CM

## 2018-11-23 DIAGNOSIS — B9562 Methicillin resistant Staphylococcus aureus infection as the cause of diseases classified elsewhere: Secondary | ICD-10-CM

## 2018-11-23 DIAGNOSIS — M0000 Staphylococcal arthritis, unspecified joint: Secondary | ICD-10-CM

## 2018-11-23 HISTORY — PX: EXCISIONAL TOTAL KNEE ARTHROPLASTY WITH ANTIBIOTIC SPACERS: SHX5827

## 2018-11-23 LAB — GLUCOSE, CAPILLARY
Glucose-Capillary: 122 mg/dL — ABNORMAL HIGH (ref 70–99)
Glucose-Capillary: 144 mg/dL — ABNORMAL HIGH (ref 70–99)
Glucose-Capillary: 165 mg/dL — ABNORMAL HIGH (ref 70–99)
Glucose-Capillary: 386 mg/dL — ABNORMAL HIGH (ref 70–99)
Glucose-Capillary: 264 mg/dL — ABNORMAL HIGH (ref 70–99)

## 2018-11-23 LAB — CULTURE, BLOOD (ROUTINE X 2): Special Requests: ADEQUATE

## 2018-11-23 LAB — AEROBIC/ANAEROBIC CULTURE W GRAM STAIN (SURGICAL/DEEP WOUND)

## 2018-11-23 LAB — CREATININE, SERUM
Creatinine, Ser: 0.78 mg/dL (ref 0.61–1.24)
GFR calc Af Amer: >60 mL/min (ref >60)
GFR calc non Af Amer: >60 mL/min (ref >60)

## 2018-11-23 SURGERY — REMOVAL, TOTAL ARTHROPLASTY HARDWARE, KNEE, WITH ANTIBIOTIC SPACER INSERTION
Anesthesia: General | Site: Knee | Laterality: Right

## 2018-11-23 MED ORDER — FENTANYL CITRATE (PF) 100 MCG/2ML IJ SOLN
INTRAMUSCULAR | Status: DC | PRN
  Administered 2018-11-23: 100 ug via INTRAVENOUS
  Administered 2018-11-23 (×2): 50 ug via INTRAVENOUS
  Administered 2018-11-23: 100 ug via INTRAVENOUS
  Administered 2018-11-23 (×4): 50 ug via INTRAVENOUS

## 2018-11-23 MED ORDER — HYDROMORPHONE HCL 1 MG/ML IJ SOLN
INTRAMUSCULAR | Status: AC
  Filled 2018-11-23: qty 1

## 2018-11-23 MED ORDER — SODIUM CHLORIDE 0.9 % IV SOLN
INTRAVENOUS | Status: DC
  Administered 2018-11-23: 15:00:00 via INTRAVENOUS

## 2018-11-23 MED ORDER — PHENYLEPHRINE 40 MCG/ML (10ML) SYRINGE FOR IV PUSH (FOR BLOOD PRESSURE SUPPORT)
PREFILLED_SYRINGE | INTRAVENOUS | Status: AC
  Filled 2018-11-23: qty 10

## 2018-11-23 MED ORDER — SODIUM CHLORIDE 0.9% FLUSH
10.0000 mL | Freq: Two times a day (BID) | INTRAVENOUS | Status: DC
  Administered 2018-12-01: 10 mL

## 2018-11-23 MED ORDER — OXYCODONE HCL 5 MG PO TABS
5.0000 mg | ORAL_TABLET | Freq: Once | ORAL | Status: AC | PRN
  Administered 2018-11-23: 13:00:00 5 mg via ORAL

## 2018-11-23 MED ORDER — LACTATED RINGERS IV SOLN
INTRAVENOUS | Status: DC
  Administered 2018-11-23: 09:00:00 via INTRAVENOUS

## 2018-11-23 MED ORDER — OXYCODONE HCL 5 MG/5ML PO SOLN: 5.0000 mg | Freq: Once | ORAL | Status: AC | PRN

## 2018-11-23 MED ORDER — ACETAMINOPHEN 500 MG PO TABS
1000.0000 mg | ORAL_TABLET | Freq: Once | ORAL | Status: AC
  Administered 2018-11-23: 1000 mg via ORAL
  Filled 2018-11-23: qty 2

## 2018-11-23 MED ORDER — VANCOMYCIN HCL 1000 MG IV SOLR
INTRAVENOUS | Status: DC | PRN
  Administered 2018-11-23: 4 g

## 2018-11-23 MED ORDER — FENTANYL CITRATE (PF) 250 MCG/5ML IJ SOLN
INTRAMUSCULAR | Status: AC
  Filled 2018-11-23: qty 5

## 2018-11-23 MED ORDER — MIDAZOLAM HCL 5 MG/5ML IJ SOLN
INTRAMUSCULAR | Status: DC | PRN
  Administered 2018-11-23: 2 mg via INTRAVENOUS

## 2018-11-23 MED ORDER — DEXAMETHASONE SODIUM PHOSPHATE 10 MG/ML IJ SOLN
INTRAMUSCULAR | Status: DC | PRN
  Administered 2018-11-23: 10 mg via INTRAVENOUS

## 2018-11-23 MED ORDER — PHENYLEPHRINE 40 MCG/ML (10ML) SYRINGE FOR IV PUSH (FOR BLOOD PRESSURE SUPPORT)
PREFILLED_SYRINGE | INTRAVENOUS | Status: DC | PRN
  Administered 2018-11-23 (×2): 120 ug via INTRAVENOUS

## 2018-11-23 MED ORDER — SUGAMMADEX SODIUM 200 MG/2ML IV SOLN
INTRAVENOUS | Status: DC | PRN
  Administered 2018-11-23: 160 mg via INTRAVENOUS

## 2018-11-23 MED ORDER — ROCURONIUM BROMIDE 50 MG/5ML IV SOSY
PREFILLED_SYRINGE | INTRAVENOUS | Status: DC | PRN
  Administered 2018-11-23: 50 mg via INTRAVENOUS

## 2018-11-23 MED ORDER — GLYCOPYRROLATE PF 0.2 MG/ML IJ SOSY
PREFILLED_SYRINGE | INTRAMUSCULAR | Status: AC
  Filled 2018-11-23: qty 1

## 2018-11-23 MED ORDER — DEXAMETHASONE SODIUM PHOSPHATE 10 MG/ML IJ SOLN
INTRAMUSCULAR | Status: AC
  Filled 2018-11-23: qty 1

## 2018-11-23 MED ORDER — PROMETHAZINE HCL 25 MG/ML IJ SOLN
INTRAMUSCULAR | Status: AC
  Filled 2018-11-23: qty 1

## 2018-11-23 MED ORDER — BUPIVACAINE HCL (PF) 0.25 % IJ SOLN
INTRAMUSCULAR | Status: AC
  Filled 2018-11-23: qty 30

## 2018-11-23 MED ORDER — CHLORHEXIDINE GLUCONATE CLOTH 2 % EX PADS
6.0000 | MEDICATED_PAD | Freq: Every day | CUTANEOUS | Status: DC
  Administered 2018-11-23 – 2018-11-27 (×5): 6 via TOPICAL

## 2018-11-23 MED ORDER — OXYCODONE HCL 5 MG PO TABS
ORAL_TABLET | ORAL | Status: AC
  Filled 2018-11-23: qty 1

## 2018-11-23 MED ORDER — ONDANSETRON HCL 4 MG/2ML IJ SOLN
INTRAMUSCULAR | Status: DC | PRN
  Administered 2018-11-23: 4 mg via INTRAVENOUS

## 2018-11-23 MED ORDER — POVIDONE-IODINE 10 % EX SWAB: 2.0000 "application " | Freq: Once | CUTANEOUS | Status: DC

## 2018-11-23 MED ORDER — ONDANSETRON HCL 4 MG/2ML IJ SOLN
INTRAMUSCULAR | Status: AC
  Filled 2018-11-23: qty 2

## 2018-11-23 MED ORDER — PROMETHAZINE HCL 25 MG/ML IJ SOLN
6.2500 mg | INTRAMUSCULAR | Status: DC | PRN
  Administered 2018-11-23: 6.25 mg via INTRAVENOUS

## 2018-11-23 MED ORDER — CHLORHEXIDINE GLUCONATE 4 % EX LIQD
60.0000 mL | Freq: Once | CUTANEOUS | Status: AC
  Administered 2018-11-23: 4 via TOPICAL

## 2018-11-23 MED ORDER — MIDAZOLAM HCL 2 MG/2ML IJ SOLN
INTRAMUSCULAR | Status: AC
  Filled 2018-11-23: qty 2

## 2018-11-23 MED ORDER — CHLORHEXIDINE GLUCONATE CLOTH 2 % EX PADS
6.0000 | MEDICATED_PAD | Freq: Every day | CUTANEOUS | Status: DC
  Administered 2018-11-23 – 2018-12-01 (×7): 6 via TOPICAL

## 2018-11-23 MED ORDER — SODIUM CHLORIDE 0.9% FLUSH: 10.0000 mL | INTRAVENOUS | Status: DC | PRN

## 2018-11-23 MED ORDER — LIDOCAINE 2% (20 MG/ML) 5 ML SYRINGE
INTRAMUSCULAR | Status: DC | PRN
  Administered 2018-11-23: 40 mg via INTRAVENOUS
  Administered 2018-11-23: 60 mg via INTRAVENOUS

## 2018-11-23 MED ORDER — SODIUM CHLORIDE 0.9 % IR SOLN
Status: DC | PRN
  Administered 2018-11-23: 1

## 2018-11-23 MED ORDER — VANCOMYCIN HCL 1000 MG IV SOLR
INTRAVENOUS | Status: AC
  Filled 2018-11-23: qty 4000

## 2018-11-23 MED ORDER — PROPOFOL 10 MG/ML IV BOLUS
INTRAVENOUS | Status: DC | PRN
  Administered 2018-11-23: 50 mg via INTRAVENOUS
  Administered 2018-11-23: 150 mg via INTRAVENOUS

## 2018-11-23 MED ORDER — HYDROMORPHONE HCL 1 MG/ML IJ SOLN
0.2500 mg | INTRAMUSCULAR | Status: DC | PRN
  Administered 2018-11-23 (×3): 0.5 mg via INTRAVENOUS

## 2018-11-23 MED ORDER — CEFAZOLIN SODIUM-DEXTROSE 2-4 GM/100ML-% IV SOLN
2.0000 g | INTRAVENOUS | Status: AC
  Administered 2018-11-23: 2 g via INTRAVENOUS
  Filled 2018-11-23: qty 100

## 2018-11-23 SURGICAL SUPPLY — 58 items
AUGMENT TIBL 52 AP 81 KNEE LRG (Miscellaneous) IMPLANT
BLADE SAW SAG 90X13X1.27 (BLADE) ×2 IMPLANT
BLADE SAW SGTL 13.0X1.19X90.0M (BLADE) ×2 IMPLANT
BLADE SAW SGTL 81X20 HD (BLADE) ×1 IMPLANT
BNDG COHESIVE 6X5 TAN STRL LF (GAUZE/BANDAGES/DRESSINGS) ×3 IMPLANT
BOWL SMART MIX CTS (DISPOSABLE) ×2 IMPLANT
CEMENT BONE REFOBACIN R1X40 US (Cement) ×4 IMPLANT
COVER BACK TABLE 24X17X13 BIG (DRAPES) ×1 IMPLANT
COVER SURGICAL LIGHT HANDLE (MISCELLANEOUS) ×4 IMPLANT
COVER WAND RF STERILE (DRAPES) ×2 IMPLANT
CUFF TOURN SGL QUICK 34 (TOURNIQUET CUFF) ×2
CUFF TOURN SGL QUICK 42 (TOURNIQUET CUFF) IMPLANT
CUFF TRNQT CYL 34X4.125X (TOURNIQUET CUFF) IMPLANT
DRAPE HALF SHEET 40X57 (DRAPES) ×4 IMPLANT
DRAPE IMP U-DRAPE 54X76 (DRAPES) ×2 IMPLANT
DRAPE U-SHAPE 47X51 STRL (DRAPES) ×2 IMPLANT
DRSG ADAPTIC 3X8 NADH LF (GAUZE/BANDAGES/DRESSINGS) ×2 IMPLANT
DRSG PAD ABDOMINAL 8X10 ST (GAUZE/BANDAGES/DRESSINGS) ×2 IMPLANT
DURAPREP 26ML APPLICATOR (WOUND CARE) ×2 IMPLANT
ELECT REM PT RETURN 9FT ADLT (ELECTROSURGICAL) ×2
ELECTRODE REM PT RTRN 9FT ADLT (ELECTROSURGICAL) ×1 IMPLANT
FACESHIELD WRAPAROUND (MASK) ×4 IMPLANT
FACESHIELD WRAPAROUND OR TEAM (MASK) ×1 IMPLANT
FEMORAL 53 AP 75 KNEE LRG (Miscellaneous) ×1 IMPLANT
GAUZE SPONGE 4X4 12PLY STRL (GAUZE/BANDAGES/DRESSINGS) ×2 IMPLANT
GLOVE BIOGEL PI IND STRL 9 (GLOVE) ×1 IMPLANT
GLOVE BIOGEL PI INDICATOR 9 (GLOVE) ×1
GLOVE SURG ORTHO 9.0 STRL STRW (GLOVE) ×2 IMPLANT
GOWN STRL REUS W/ TWL XL LVL3 (GOWN DISPOSABLE) ×3 IMPLANT
GOWN STRL REUS W/TWL XL LVL3 (GOWN DISPOSABLE) ×6
HANDPIECE INTERPULSE COAX TIP (DISPOSABLE) ×2
KIT BASIN OR (CUSTOM PROCEDURE TRAY) ×2 IMPLANT
KIT TURNOVER KIT B (KITS) ×2 IMPLANT
MANIFOLD NEPTUNE II (INSTRUMENTS) ×2 IMPLANT
NDL SPNL 18GX3.5 QUINCKE PK (NEEDLE) ×1 IMPLANT
NEEDLE SPNL 18GX3.5 QUINCKE PK (NEEDLE) ×2 IMPLANT
NS IRRIG 1000ML POUR BTL (IV SOLUTION) ×2 IMPLANT
PACK TOTAL JOINT (CUSTOM PROCEDURE TRAY) ×2 IMPLANT
PACK UNIVERSAL I (CUSTOM PROCEDURE TRAY) ×2 IMPLANT
PAD ABD 8X10 STRL (GAUZE/BANDAGES/DRESSINGS) ×1 IMPLANT
PAD ARMBOARD 7.5X6 YLW CONV (MISCELLANEOUS) ×4 IMPLANT
PADDING CAST COTTON 6X4 STRL (CAST SUPPLIES) ×2 IMPLANT
SET HNDPC FAN SPRY TIP SCT (DISPOSABLE) IMPLANT
STAPLER VISISTAT 35W (STAPLE) ×2 IMPLANT
SUCTION FRAZIER HANDLE 10FR (MISCELLANEOUS) ×1
SUCTION TUBE FRAZIER 10FR DISP (MISCELLANEOUS) IMPLANT
SUT VIC AB 0 CT1 27 (SUTURE) ×2
SUT VIC AB 0 CT1 27XBRD ANBCTR (SUTURE) IMPLANT
SUT VIC AB 1 CTX 36 (SUTURE) ×2
SUT VIC AB 1 CTX36XBRD ANBCTR (SUTURE) IMPLANT
SUT VIC AB 2-0 CTB1 (SUTURE) ×1 IMPLANT
SWAB CULTURE ESWAB REG 1ML (MISCELLANEOUS) ×2 IMPLANT
TIBIAL 52 AP 81 KNEE LRG (Miscellaneous) ×2 IMPLANT
TOWEL GREEN STERILE (TOWEL DISPOSABLE) ×2 IMPLANT
TOWEL GREEN STERILE FF (TOWEL DISPOSABLE) ×2 IMPLANT
TRAY FOLEY MTR SLVR 16FR STAT (SET/KITS/TRAYS/PACK) IMPLANT
WATER STERILE IRR 1000ML POUR (IV SOLUTION) ×2 IMPLANT
WRAP KNEE MAXI GEL POST OP (GAUZE/BANDAGES/DRESSINGS) ×2 IMPLANT

## 2018-11-23 NOTE — Interval H&P Note (Signed)
History and Physical Interval Note:  11/23/2018 6:53 AM  [name]  has presented today for surgery, with the diagnosis of Septic Right Total Knee Arthroplasty.  The various methods of treatment have been discussed with the patient and family. After consideration of risks, benefits and other options for treatment, the patient has consented to  Procedure(s): REMOVAL OF RIGHT TOTAL KNEE ARTHROPLASTY AND PLACEMENT OF ANATOMIC ANTIBIOTIC SPACER (Right) as a surgical intervention.  The patient's history has been reviewed, patient examined, no change in status, stable for surgery.  I have reviewed the patient's chart and labs.  Questions were answered to the patient's satisfaction.     [name]

## 2018-11-23 NOTE — Transfer of Care (Signed)
Immediate Anesthesia Transfer of Care Note  Patient: [name]  Procedure(s) Performed: REMOVAL OF RIGHT TOTAL KNEE ARTHROPLASTY AND PLACEMENT OF ANATOMIC ANTIBIOTIC SPACER (Right Knee)  Patient Location: PACU  Anesthesia Type:General  Level of Consciousness: awake and patient cooperative  Airway & Oxygen Therapy: Patient Spontanous Breathing  Post-op Assessment: Report given to RN and Post -op Vital signs reviewed and stable  Post vital signs: Reviewed and stable  Last Vitals:  Vitals Value Taken Time  BP 155/84 11/23/18 1235  Temp    Pulse 92 11/23/18 1237  Resp 16 11/23/18 1237  SpO2 100 % 11/23/18 1237  Vitals shown include unvalidated device data.  Last Pain:  Vitals:   11/23/18 0741  TempSrc:   PainSc: 0-No pain      Patients Stated Pain Goal: 4 ([id number])  Complications: No apparent anesthesia complications

## 2018-11-23 NOTE — TOC Progression Note (Signed)
Transition of Care [facility]) - Progression Note    Patient Details  Name: Emerald Cunnane MRN: [id number] Date of Birth: 1965/12/13  Transition of Care [facility]) CM/SW [place], [place] Phone Number: 11/23/2018, 5:05 PM  Clinical Narrative:    Acknowledging consult for SNF- [facility] team has been following pt for planned dc home. Patient from home with son and Jenkins. Patient has been at home before with IV ABX. Patient has walker and 3 in1 at home already. Requested orders. Per note patient staying for more surgery next week. [place] Flow and Pam following.   Expected Discharge Plan: [place] Barriers to Discharge: Continued Medical Work up  Expected Discharge Plan and Services Expected Discharge Plan: [place] Discharge Planning Services: CM Consult Post Acute Care Choice: [place] arrangements for the past 2 months: Single Family Home         DME Arranged: N/A HH Arranged: RN, PT [place] Agency: [place] ([place]) Date [place]: 11/18/18 Time [place]: 62 Representative spoke with at [place]: [place] ([place]) Interventions    Readmission Risk Interventions No flowsheet data found.

## 2018-11-23 NOTE — Progress Notes (Signed)
Peripherally Inserted Central Catheter/Midline Placement  The IV Nurse has discussed with the patient and/or persons authorized to consent for the patient, the purpose of this procedure and the potential benefits and risks involved with this procedure.  The benefits include less needle sticks, lab draws from the catheter, and the patient may be discharged home with the catheter. Risks include, but not limited to, infection, bleeding, blood clot (thrombus formation), and puncture of an artery; nerve damage and irregular heartbeat and possibility to perform a PICC exchange if needed/ordered by physician.  Alternatives to this procedure were also discussed.  Bard Power PICC patient education guide, fact sheet on infection prevention and patient information card has been provided to patient /or left at bedside.    PICC/Midline Placement Documentation  PICC Single Lumen 11/23/18 PICC Right Brachial 45 cm 0 cm (Active)  Indication for Insertion or Continuance of Line Home intravenous therapies (PICC only) 11/23/18 2152  Exposed Catheter (cm) 0 cm 11/23/18 2152  Site Assessment Clean;Dry;Intact 11/23/18 2152  Line Status Flushed;Saline locked;Blood return noted 11/23/18 2152  Dressing Type Transparent 11/23/18 2152  Dressing Status Clean;Intact;Dry;Antimicrobial disc in place 11/23/18 2152  Dressing Change Due 11/30/18 11/23/18 2152       Gordan Payment 11/23/2018, 9:53 PM

## 2018-11-23 NOTE — Progress Notes (Signed)
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  Afebrile.  No acute events.  No complaints.     Physical Exam: Constitutional:  Vitals:   11/23/18 1415 11/23/18 1420  BP:  (!) 149/83  Pulse:  82  Resp:  18  Temp:  98.2 F (36.8 C)  SpO2: 100% 100%   patient appears in NAD Respiratory: Normal respiratory effort; CTA B Cardiovascular: RRR GI: soft, nt, nd MS: leg wrapped  Review of Systems: Constitutional: negative for fevers and chills Gastrointestinal: negative for nausea and diarrhea Integument/breast: negative for rash  Lab Results  Component Value Date   WBC 10.6 (H) 11/21/2018   HGB 10.1 (L) 11/21/2018   HCT 30.7 (L) 11/21/2018   MCV 78.7 (L) 11/21/2018   PLT 328 11/21/2018    Lab Results  Component Value Date   CREATININE 0.78 11/23/2018   BUN 15 11/21/2018   NA 132 (L) 11/21/2018   K 3.8 11/21/2018   CL 98 11/21/2018   CO2 23 11/21/2018    Lab Results  Component Value Date   ALT 30 11/16/2018   AST 16 11/16/2018   ALKPHOS 227 (H) 11/16/2018     Microbiology: Recent Results (from the past 240 hour(s))  Gram stain     Status: None   Collection Time: 11/16/18  9:00 PM   Specimen: Synovium; Body Fluid  Result Value Ref Range Status   Specimen Description SYNOVIAL  Final   Special Requests KNEE,JOINT  Final   Gram Stain   Final    ABUNDANT WBC PRESENT,BOTH PMN AND MONONUCLEAR NO ORGANISMS SEEN Performed at [facility], [address], [place], [place] [address]    Report Status 11/16/2018 FINAL  Final  Culture, body fluid-bottle     Status: Abnormal   Collection Time: 11/16/18  9:00 PM   Specimen: Synovium  Result Value Ref Range Status   Specimen Description   Final    SYNOVIAL Performed at [facility], [address] [place], [place] [address]    Special Requests   Final    KNEE,JOINT Performed at [facility], [address], [place], [place] [address]    Gram Stain   Final    GRAM POSITIVE COCCI AEROBIC AND ANAEROBIC Gram Stain Report Called to,Read Back By and Verified With: HARVEY N. AT [place] [id number] BY THOMPSON S. Performed at [facility], [address] [place], [place] [address]    Culture METHICILLIN RESISTANT STAPHYLOCOCCUS AUREUS (A)  Final   Report Status 11/21/2018 FINAL  Final   Organism ID, Bacteria METHICILLIN RESISTANT STAPHYLOCOCCUS AUREUS  Final      Susceptibility   Methicillin resistant staphylococcus aureus - MIC*    CIPROFLOXACIN >=8 RESISTANT Resistant     ERYTHROMYCIN >=8 RESISTANT Resistant     GENTAMICIN <=0.5 SENSITIVE Sensitive     OXACILLIN >=4 RESISTANT Resistant     TETRACYCLINE >=16 RESISTANT Resistant     VANCOMYCIN 1 SENSITIVE Sensitive     TRIMETH/SULFA >=320 RESISTANT Resistant     CLINDAMYCIN RESISTANT Resistant     RIFAMPIN <=0.5 SENSITIVE Sensitive     Inducible Clindamycin POSITIVE Resistant     * METHICILLIN RESISTANT STAPHYLOCOCCUS AUREUS  SARS CORONAVIRUS 2 (TAT 6-24 HRS) Nasopharyngeal Nasopharyngeal Swab     Status: None   Collection Time: 11/16/18 10:10 PM   Specimen: Nasopharyngeal Swab  Result Value Ref Range Status   SARS Coronavirus 2 NEGATIVE NEGATIVE  Final    Comment: (NOTE) SARS-CoV-2 target nucleic acids are NOT DETECTED. The SARS-CoV-2 RNA is generally detectable in upper and lower respiratory specimens during the acute phase of infection. Negative results do not preclude SARS-CoV-2 infection, do not rule out co-infections with other pathogens, and should not be used as the sole basis for treatment or other patient management decisions. Negative results must be combined with clinical observations, patient history, and epidemiological information. The expected result is Negative. Fact Sheet for Patients: SugarRoll.be Fact Sheet for Healthcare Providers: https://www.woods-mathews.com/ This test is not yet approved or cleared by  the [place] FDA and  has been authorized for detection and/or diagnosis of SARS-CoV-2 by FDA under an Emergency Use Authorization (EUA). This EUA will remain  in effect (meaning this test can be used) for the duration of the COVID-19 declaration under Section 56 4(b)(1) of the Act, 21 U.S.C. section 360bbb-3(b)(1), unless the authorization is terminated or revoked sooner. Performed at [facility], [place] [address] [place], [place] [address]   Culture, blood (Routine X 2) w Reflex to ID Panel     Status: Abnormal   Collection Time: 11/16/18 11:16 PM   Specimen: BLOOD  Result Value Ref Range Status   Specimen Description   Final    BLOOD RIGHT ANTECUBITAL Performed at [facility], [address] [place], [place] [address]    Special Requests   Final    BOTTLES DRAWN AEROBIC AND ANAEROBIC Blood Culture adequate volume Performed at [facility], [address], [place], [place] [address]    Culture  Setup Time   Final    BOTH AEROBIC AND ANAEROBIC GRAM POSITIVE COCCI Gram Stain Report Called to,Read Back By and Verified With: K DULIL,RN '@0400'$  11/18/18 MKELLY Performed at [facility], [address], [place], [place] [address]    Culture METHICILLIN RESISTANT STAPHYLOCOCCUS AUREUS (A)  Final   Report Status 11/23/2018 FINAL  Final   Organism ID, Bacteria METHICILLIN RESISTANT STAPHYLOCOCCUS AUREUS  Final      Susceptibility   Methicillin resistant staphylococcus aureus - MIC*    CIPROFLOXACIN >=8 RESISTANT Resistant     ERYTHROMYCIN >=8 RESISTANT Resistant     GENTAMICIN <=0.5 SENSITIVE Sensitive     OXACILLIN >=4 RESISTANT Resistant     TETRACYCLINE >=16 RESISTANT Resistant     VANCOMYCIN 1 SENSITIVE Sensitive     TRIMETH/SULFA >=320 RESISTANT Resistant     CLINDAMYCIN RESISTANT Resistant     RIFAMPIN <=0.5 SENSITIVE Sensitive     Inducible Clindamycin POSITIVE Resistant     * METHICILLIN RESISTANT STAPHYLOCOCCUS AUREUS  Culture, blood (Routine X 2) w Reflex to ID  Panel     Status: None   Collection Time: 11/16/18 11:18 PM   Specimen: BLOOD RIGHT HAND  Result Value Ref Range Status   Specimen Description BLOOD RIGHT HAND  Final   Special Requests   Final    BOTTLES DRAWN AEROBIC AND ANAEROBIC Blood Culture adequate volume   Culture   Final    NO GROWTH 5 DAYS Performed at [facility], [address], [place],  [address]    Report Status 11/21/2018 FINAL  Final  Surgical pcr screen     Status: None   Collection Time: 11/17/18  2:59 PM   Specimen: Nasal Mucosa; Nasal Swab  Result Value Ref Range Status   MRSA, PCR NEGATIVE NEGATIVE Final   Staphylococcus aureus NEGATIVE NEGATIVE Final    Comment: (NOTE) The Xpert SA Assay (FDA approved for NASAL specimens  in patients 86 years of age and older), is one component of a comprehensive surveillance program. It is not intended to diagnose infection nor to guide or monitor treatment. Performed at [facility], [place] [address] [place], [place] [address]   Aerobic/Anaerobic Culture (surgical/deep wound)     Status: None   Collection Time: 11/18/18 10:06 AM   Specimen: PATH Soft tissue  Result Value Ref Range Status   Specimen Description TISSUE  Final   Special Requests NONE  Final   Gram Stain   Final    ABUNDANT WBC PRESENT, PREDOMINANTLY PMN RARE GRAM POSITIVE COCCI    Culture   Final    FEW METHICILLIN RESISTANT STAPHYLOCOCCUS AUREUS NO ANAEROBES ISOLATED Performed at [facility], [address], [place], [place] [address]    Report Status 11/23/2018 FINAL  Final   Organism ID, Bacteria METHICILLIN RESISTANT STAPHYLOCOCCUS AUREUS  Final      Susceptibility   Methicillin resistant staphylococcus aureus - MIC*    CIPROFLOXACIN >=8 RESISTANT Resistant     ERYTHROMYCIN >=8 RESISTANT Resistant     GENTAMICIN <=0.5 SENSITIVE Sensitive     OXACILLIN >=4 RESISTANT Resistant     TETRACYCLINE >=16 RESISTANT Resistant     VANCOMYCIN 1 SENSITIVE Sensitive     TRIMETH/SULFA  >=320 RESISTANT Resistant     CLINDAMYCIN RESISTANT Resistant     RIFAMPIN <=0.5 SENSITIVE Sensitive     Inducible Clindamycin POSITIVE Resistant     * FEW METHICILLIN RESISTANT STAPHYLOCOCCUS AUREUS  Acid Fast Smear (AFB)     Status: None   Collection Time: 11/18/18 10:06 AM   Specimen: PATH Soft tissue  Result Value Ref Range Status   AFB Specimen Processing Concentration  Final   Acid Fast Smear Negative  Final    Comment: (NOTE) Performed At: [facility] [address] [place], [place] [id number] Rush Farmer MD XH:[id number]    Source (AFB) TISSUE  Final    Comment: Performed at [facility], [place] [address] [place], [place] [address]  Culture, blood (routine x 2)     Status: None (Preliminary result)   Collection Time: 11/22/18  4:34 PM   Specimen: BLOOD  Result Value Ref Range Status   Specimen Description BLOOD LEFT ANTECUBITAL  Final   Special Requests   Final    BOTTLES DRAWN AEROBIC AND ANAEROBIC Blood Culture adequate volume   Culture   Final    NO GROWTH < 24 HOURS Performed at [facility], [place] [address], [place], [place] [address]    Report Status PENDING  Incomplete  Culture, blood (routine x 2)     Status: None (Preliminary result)   Collection Time: 11/22/18  4:35 PM   Specimen: BLOOD LEFT HAND  Result Value Ref Range Status   Specimen Description BLOOD LEFT HAND  Final   Special Requests   Final    BOTTLES DRAWN AEROBIC AND ANAEROBIC Blood Culture adequate volume   Culture   Final    NO GROWTH < 24 HOURS Performed at [facility], [place] [address] [place], [place] [address]    Report Status PENDING  Incomplete  Aerobic/Anaerobic Culture (surgical/deep wound)     Status: None (Preliminary result)   Collection Time: 11/23/18 11:18 AM   Specimen: PATH Cytology Misc. fluid; Body Fluid  Result Value Ref Range Status   Specimen Description TISSUE SYNOVIAL LEFT KNEE  Final   Special Requests NONE  Final   Gram Stain  Final     FEW WBC PRESENT, PREDOMINANTLY PMN NO ORGANISMS SEEN Performed at [place] [address] [place], [place] [address]    Culture PENDING  Incomplete   Report Status PENDING  Incomplete    Impression/Plan:  1. PJI s/p resection arthroplasty - MRSA in blood culture (1 of 4 bottles), in aspiration culture and now with removal and antibiotic spacer by Dr. Sharol Given.  Will need 6 weeks of IV vancomycin through 12/15 Baseline CRP 36, ESR 120.   opat  2.  Access - will order picc line.    3. Medication monitoring - will need twice weekly labs with [facility]  He will follow up with me 11/23  I will sign off, call with any questions.

## 2018-11-23 NOTE — Anesthesia Postprocedure Evaluation (Signed)
Anesthesia Post Note  Patient: Ton Summersett  Procedure(s) Performed: REMOVAL OF RIGHT TOTAL KNEE ARTHROPLASTY AND PLACEMENT OF ANATOMIC ANTIBIOTIC SPACER (Right Knee)     Patient location during evaluation: PACU Anesthesia Type: General Level of consciousness: awake and alert and oriented Pain management: pain level controlled Vital Signs Assessment: post-procedure vital signs reviewed and stable Respiratory status: spontaneous breathing, nonlabored ventilation and respiratory function stable Cardiovascular status: blood pressure returned to baseline Postop Assessment: no apparent nausea or vomiting Anesthetic complications: no    Last Vitals:  Vitals:   11/23/18 1415 11/23/18 1420  BP:  (!) 149/83  Pulse:  82  Resp:  18  Temp:  36.8 C  SpO2: 100% 100%    Last Pain:  Vitals:   11/23/18 1420  TempSrc: Oral  PainSc:                  Brennan Bailey

## 2018-11-23 NOTE — Anesthesia Procedure Notes (Signed)
Procedure Name: Intubation Date/Time: 11/23/2018 10:57 AM Performed by: Lance Coon, CRNA Pre-anesthesia Checklist: Patient identified, Emergency Drugs available, Suction available, Patient being monitored and Timeout performed Patient Re-evaluated:Patient Re-evaluated prior to induction Oxygen Delivery Method: Circle system utilized Preoxygenation: Pre-oxygenation with 100% oxygen Induction Type: IV induction Ventilation: Mask ventilation without difficulty Laryngoscope Size: Miller and 3 Grade View: Grade I Tube type: Oral Tube size: 7.5 mm Number of attempts: 1 Airway Equipment and Method: Stylet Placement Confirmation: ETT inserted through vocal cords under direct vision,  positive ETCO2 and breath sounds checked- equal and bilateral Secured at: 23 cm Tube secured with: Tape Dental Injury: Teeth and Oropharynx as per pre-operative assessment

## 2018-11-23 NOTE — Op Note (Signed)
11/23/2018  12:45 PM  PATIENT:  [name]    PRE-OPERATIVE DIAGNOSIS:  Septic Right Total Knee Arthroplasty  POST-OPERATIVE DIAGNOSIS:  Same  PROCEDURE:  REMOVAL OF RIGHT TOTAL KNEE ARTHROPLASTY AND PLACEMENT OF ANATOMIC ANTIBIOTIC SPACER  SURGEON:  [name], MD  PHYSICIAN ASSISTANT: April Green ANESTHESIA:   General  PREOPERATIVE INDICATIONS:  [name] is a  53 y.o. male with a diagnosis of Septic Right Total Knee Arthroplasty who failed conservative measures and elected for surgical management.    The risks benefits and alternatives were discussed with the patient preoperatively including but not limited to the risks of infection, bleeding, nerve injury, cardiopulmonary complications, the need for revision surgery, among others, and the patient was willing to proceed.  OPERATIVE IMPLANTS: DePuy large anatomic antibiotic spacer with 18 mm thick on the tibia.  @ENCIMAGES @  OPERATIVE FINDINGS: Soft tissue sent for further cultures.  Patient is on vancomycin.  3 batches of antibiotic cement with 3 g of vancomycin with gentamicin within the antibiotic cement  OPERATIVE PROCEDURE: Patient was brought the operating room and underwent a general anesthetic.  After adequate levels anesthesia were obtained patient's right lower extremity was prepped using DuraPrep draped into a sterile field a timeout was called.  Charlie Pitter was used to cover all exposed skin.  The previous midline incision was used this was carried down to the medial parapatellar retinacular incision.  Using a small sawblade this was placed underneath the femoral component and this was used to dislodge the femoral component from the cement.  The femoral component was removed and the remainder of the Palacos cement was removed.  Attention was then focused on the tibia.  The polyethylene tray was removed the oscillating saw was used placed under the tibial tray and the tibial tray was removed without complication.  The  oscillating saw was also used to remove the patella and the patella buttons were also removed.  All the cement that was within the tibial canal on the patella and femur was removed.  The wound was irrigated with pulsatile lavage.  The size large antibiotic spacers were made with a total of 3 containers of Palacos cement each with gentamicin with a total of 3 g of vancomycin within the cement.  The antibiotic anatomic spacer was placed this was allowed to harden.  Total tourniquet time was approximately 40 minutes hemostasis was obtained.  The retinaculum was closed using 0 Vicryl subcu was closed using 2-0 Vicryl the skin was closed using staples a sterile dressing was applied.   DISCHARGE PLANNING:  Antibiotic duration: Patient will continue IV vancomycin.  Patient's implants have a total of 3 g of vancomycin.  Weightbearing: Weightbearing as tolerated minimize range of motion of the knee.  Pain medication: Continue opioid pathway  Dressing care/ Wound VAC: Dry dressing change as needed  Ambulatory devices: Walker or crutches  Discharge to: Patient may need discharge to skilled nursing he will need 6 weeks of IV vancomycin for his MRSA infection.  Follow-up: In the office 1 week post operative.

## 2018-11-23 NOTE — Progress Notes (Signed)
PHARMACY CONSULT NOTE FOR:  OUTPATIENT  PARENTERAL ANTIBIOTIC THERAPY (OPAT)  Indication:  Prosthetic joint infection Regimen:  Vancomycin 1500 mg IV Q 12 hrs End date:  01/03/19  IV antibiotic discharge orders are pended. To discharging provider:  please sign these orders via discharge navigator,  Select New Orders & click on the button choice - Manage This Unsigned Work.     Thank you for allowing pharmacy to be a part of this patient's care.  Gillermina Hu, PharmD, BCPS, [facility] Clinical Pharmacist 11/23/2018, 4:18 PM

## 2018-11-24 ENCOUNTER — Ambulatory Visit: Payer: Medicare Other | Admitting: Orthopedic Surgery

## 2018-11-24 LAB — BASIC METABOLIC PANEL
Anion gap: 11 (ref 5–15)
BUN: 14 mg/dL (ref 6–20)
CO2: 24 mmol/L (ref 22–32)
Calcium: 8.6 mg/dL — ABNORMAL LOW (ref 8.9–10.3)
Chloride: 98 mmol/L (ref 98–111)
Creatinine, Ser: 0.81 mg/dL (ref 0.61–1.24)
GFR calc Af Amer: >60 mL/min (ref >60)
GFR calc non Af Amer: >60 mL/min (ref >60)
Glucose, Bld: 227 mg/dL — ABNORMAL HIGH (ref 70–99)
Potassium: 4.1 mmol/L (ref 3.5–5.1)
Sodium: 133 mmol/L — ABNORMAL LOW (ref 135–145)

## 2018-11-24 LAB — MAGNESIUM: Magnesium: 2.1 mg/dL (ref 1.7–2.4)

## 2018-11-24 LAB — CBC
HCT: 22.7 % — ABNORMAL LOW (ref 39.0–52.0)
Hemoglobin: 7.3 g/dL — ABNORMAL LOW (ref 13.0–17.0)
MCH: 25.7 pg — ABNORMAL LOW (ref 26.0–34.0)
MCHC: 32.2 g/dL (ref 30.0–36.0)
MCV: 79.9 fL — ABNORMAL LOW (ref 80.0–100.0)
Platelets: 424 10*3/uL — ABNORMAL HIGH (ref 150–400)
RBC: 2.84 MIL/uL — ABNORMAL LOW (ref 4.22–5.81)
RDW: 14.6 % (ref 11.5–15.5)
WBC: 18.7 10*3/uL — ABNORMAL HIGH (ref 4.0–10.5)
nRBC: 0 % (ref 0.0–0.2)

## 2018-11-24 LAB — GLUCOSE, CAPILLARY
Glucose-Capillary: 167 mg/dL — ABNORMAL HIGH (ref 70–99)
Glucose-Capillary: 209 mg/dL — ABNORMAL HIGH (ref 70–99)
Glucose-Capillary: 218 mg/dL — ABNORMAL HIGH (ref 70–99)

## 2018-11-24 LAB — VANCOMYCIN, TROUGH: Vancomycin Tr: 22 ug/mL (ref 15–20)

## 2018-11-24 MED ORDER — VANCOMYCIN HCL 10 G IV SOLR
1250.0000 mg | Freq: Two times a day (BID) | INTRAVENOUS | Status: DC
  Administered 2018-11-25 – 2018-11-28 (×8): 1250 mg via INTRAVENOUS
  Filled 2018-11-24 (×9): qty 1250

## 2018-11-24 NOTE — Progress Notes (Signed)
Pharmacy Antibiotic Note  [name] is a 53 y.o. 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 Patient is s/p debridement and removal of hardware on 11/4.  Pharmacy has been consulted for Vancomycin dosing.  WBC count is 18.7 and patient is afebrile. SCr is stable at 0.81. Vancomycin trough was drawn today at 14:52.  ~ 30 minutes early for a true trough. Level was supratherapeutic at 22.   Vancomycin peak was unfortunately not drawn this morning so unable to calculate AUC at this time.     Plan: Decrease Vancomycin to 1250 mg every 12 hours Plan treatment for 6 weeks  Will update OPAT orders    Height: 6\' 3"  (190.5 cm) Weight: 175 lb 11.3 oz (79.7 kg) IBW/kg (Calculated) : 84.5  Temp (24hrs), Avg:98.3 F (36.8 C), Min:98.1 F (36.7 C), Max:98.4 F (36.9 C)  Recent Labs  Lab 11/18/18 0315 11/19/18 0201 11/21/18 0348 11/23/18 0537 11/24/18 0336 11/24/18 1452  WBC 12.6* 11.4* 10.6*  --  18.7*  --   CREATININE 1.06 1.00 0.84 0.78 0.81  --   VANCOTROUGH  --   --   --   --   --  22*    Estimated Creatinine Clearance: 116.2 mL/min (by C-G formula based on SCr of 0.81 mg/dL).    Allergies  Allergen Reactions  . Benadryl [Diphenhydramine Hcl] Other (See Comments)    Leg spasms   . Trazodone And Nefazodone Other (See Comments)    Leg spasms     Jimmy Footman, PharmD, BCPS, BCIDP Infectious Diseases Clinical Pharmacist Phone: [phone] 11/24/2018 3:48 PM

## 2018-11-24 NOTE — Progress Notes (Signed)
OT Cancellation Note  Patient Details Name: [name] MRN: [id number] DOB: 1965/07/13   Cancelled Treatment:    Reason Eval/Treat Not Completed: Pain limiting ability to participate. OT will try back next available time  Britt Bottom 11/24/2018, 3:16 PM

## 2018-11-24 NOTE — Progress Notes (Addendum)
PROGRESS NOTE    [name]   [id number]  DOB: October 01, 1965  DOA: 11/16/2018 PCP: Lucia Gaskins, MD   Brief Narrative:  [name] is a 53 y/o male with DM2, CVA. 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 He drove to [place] last weekend to get his son and on the drive back he noted the swelling in his knee which extended down to his foot along with redness. He had pain as well. He did not not fevers or chills. He does not recall any injury to the knee. He made an appt with Dr Sharol Given on 10/28 for later in the week but then decided to come to the ED. He was last admitted 10/12-10/13 when he was brought in unresponsive due to overdose of Oxycodone.  In the ED, the knee was aspirated and he was stared on IV antibiotics for possible septic arthritis. Knee aspirated again on 10/30 in OR. Found to be MRSA +. On Vancomycin.  He is status post removal of hardware and placement of antibiotic spacer by Ortho.  Ambulating with physical therapy.  He will need 6 weeks of IV vancomycin.     Subjective: He is sitting out in chair, alert and oriented x3.  Not in acute distress. Requesting for pain medication.  Wound drainage and plan is for change in dressing today.  Assessment & Plan:   Principal Problem:   Septic right knee joint -  MRSA bacteremia SIRS with tachycardia and leukocytosis MRSA bacteremia. Possible innoculation from foot ulcer.  Fluid aspirated- cell count 16,050, 93% neutrophils no organisms on gram stain and no crystals - 10/30 > s/p removal of 100 cc of pus like material - also has grown  MRSA He is status post hardware removal and placement of antibiotic spacer right knee. Plan is for PICC line placement for continued IV antibiotics at discharge. .  Active Problems:  Insomnia Continue with Ambien      Diabetes mellitus type 2 in  nonobese  - A1c is 6.4 Continue with sliding scale insulin Fingersticks before meals and at bedtime Hypoglycemic protocol.     Hypokalemia Monitor and replete    Anemia, microcytic Continue with iron supplementation and vitamin B12.   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   Left arm numbness and tingling - MRI brain negative Follow-up with outpatient EMG  Polysubstance abuse/prescription drug overdose. Judicious use of opiates for pain control   DVT prophylaxis: Lovenox Code Status: Full code Family Communication: Discussed with patient Disposition Plan: per ortho Consultants:   ortho Procedures: None  Antimicrobials: Vancomycin Anti-infectives (From admission, onward)   Start     Dose/Rate Route Frequency Ordered Stop   11/25/18 0400  vancomycin (VANCOCIN) 1,250 mg in sodium chloride 0.9 % 250 mL IVPB     1,250 mg 166.7 mL/hr over 90 Minutes Intravenous Every 12 hours 11/24/18 1556     11/23/18 1137  vancomycin (VANCOCIN) powder  Status:  Discontinued       As needed 11/23/18 1137 11/23/18 1230   11/23/18 1000  ceFAZolin (ANCEF) IVPB 2g/100 mL premix     2 g 200 mL/hr over 30 Minutes Intravenous On call to O.R. 11/23/18 0707 11/23/18 1102   11/21/18 1600  vancomycin (VANCOCIN)  1,500 mg in sodium chloride 0.9 % 500 mL IVPB  Status:  Discontinued     1,500 mg 250 mL/hr over 120 Minutes Intravenous Every 12 hours 11/21/18 1012 11/24/18 1556   11/20/18 0400  vancomycin (VANCOCIN) 1,250 mg in sodium chloride 0.9 % 250 mL IVPB  Status:  Discontinued     1,250 mg 166.7 mL/hr over 90 Minutes Intravenous Every 12 hours 11/19/18 1509 11/21/18 1012   11/19/18 1600  vancomycin (VANCOCIN) 2,000 mg in sodium chloride 0.9 % 500 mL IVPB     2,000 mg 250 mL/hr over 120 Minutes Intravenous  Once 11/19/18 1509  11/19/18 1816   11/18/18 0600  ceFAZolin (ANCEF) IVPB 2g/100 mL premix  Status:  Discontinued     2 g 200 mL/hr over 30 Minutes Intravenous On call to O.R. 11/17/18 2043 11/18/18 1052   11/17/18 1400  ceFAZolin (ANCEF) IVPB 2g/100 mL premix  Status:  Discontinued     2 g 200 mL/hr over 30 Minutes Intravenous Every 8 hours 11/17/18 1008 11/20/18 1307   11/17/18 1200  vancomycin (VANCOCIN) 1,250 mg in sodium chloride 0.9 % 250 mL IVPB  Status:  Discontinued     1,250 mg 166.7 mL/hr over 90 Minutes Intravenous Every 12 hours 11/17/18 0204 11/17/18 0730   11/17/18 0800  ceFEPIme (MAXIPIME) 2 g in sodium chloride 0.9 % 100 mL IVPB  Status:  Discontinued     2 g 200 mL/hr over 30 Minutes Intravenous Every 8 hours 11/17/18 0204 11/17/18 0730   11/17/18 0115  vancomycin (VANCOCIN) IVPB 1000 mg/200 mL premix     1,000 mg 200 mL/hr over 60 Minutes Intravenous  Once 11/17/18 0000 11/17/18 0254   11/17/18 0015  vancomycin (VANCOCIN) IVPB 1000 mg/200 mL premix     1,000 mg 200 mL/hr over 60 Minutes Intravenous  Once 11/17/18 0000 11/17/18 0153   11/16/18 2330  vancomycin (VANCOCIN) 2,000 mg in sodium chloride 0.9 % 500 mL IVPB  Status:  Discontinued     2,000 mg 250 mL/hr over 120 Minutes Intravenous  Once 11/16/18 2319 11/17/18 0000   11/16/18 2330  ceFEPIme (MAXIPIME) 2 g in sodium chloride 0.9 % 100 mL IVPB     2 g 200 mL/hr over 30 Minutes Intravenous  Once 11/16/18 2319 11/17/18 0045       Objective: Vitals:   11/23/18 2251 11/24/18 0200 11/24/18 0430 11/24/18 1459  BP: 127/67 130/73 125/79 131/64  Pulse: (!) 101 84 84 87  Resp: 18 18 17 18   Temp: 98.2 F (36.8 C) 98.2 F (36.8 C) 98.4 F (36.9 C) 98.1 F (36.7 C)  TempSrc: Oral Oral Oral Oral  SpO2: 100% 99% 99% 100%  Weight:      Height:        Intake/Output Summary (Last 24 hours) at 11/24/2018 1916 Last data filed at 11/24/2018 1737 Gross per 24 hour  Intake 830 ml  Output 2750 ml  Net -1920 ml   Filed Weights    11/20/18 0500 11/21/18 0500 11/23/18 0923  Weight: 84.3 kg 79.7 kg 79.7 kg    Examination: General exam: Appears comfortable  HEENT: PERRLA, oral mucosa moist, no sclera icterus or thrush Respiratory system: Clear to auscultation. Respiratory effort normal. Cardiovascular system: S1 & S2 heard,  No murmurs  Gastrointestinal system: Abdomen soft, non-tender, nondistended. Normal bowel sounds   Central nervous system: Alert and oriented. No focal neurological deficits. Extremities: No cyanosis, clubbing - right knee in dressing with drain- right foot with transmetatarsal amputation is  swollen Psychiatry:  Mood & affect appropriate.  Skin:    Left foot     Data Reviewed: I have personally reviewed following labs and imaging studies  CBC: Recent Labs  Lab 11/18/18 0315 11/19/18 0201 11/21/18 0348 11/24/18 0336  WBC 12.6* 11.4* 10.6* 18.7*  HGB 10.0* 9.6* 10.1* 7.3*  HCT 30.7* 29.2* 30.7* 22.7*  MCV 80.4 79.1* 78.7* 79.9*  PLT 291 284 328 [id number]*   Basic Metabolic Panel: Recent Labs  Lab 11/18/18 0315 11/19/18 0201 11/21/18 0348 11/23/18 0537 11/24/18 0336  NA 133* 133* 132*  --  133*  K 3.6 3.9 3.8  --  4.1  CL 100 99 98  --  98  CO2 20* 21* 23  --  24  GLUCOSE 126* 153* 148*  --  227*  BUN 16 16 15   --  14  CREATININE 1.06 1.00 0.84 0.78 0.81  CALCIUM 9.1 8.9 8.6*  --  8.6*  MG  --   --   --   --  2.1   GFR: Estimated Creatinine Clearance: 116.2 mL/min (by C-G formula based on SCr of 0.81 mg/dL). Liver Function Tests: No results for input(s): AST, ALT, ALKPHOS, BILITOT, PROT, ALBUMIN in the last 168 hours. No results for input(s): LIPASE, AMYLASE in the last 168 hours. No results for input(s): AMMONIA in the last 168 hours. Coagulation Profile: No results for input(s): INR, PROTIME in the last 168 hours. Cardiac Enzymes: No results for input(s): CKTOTAL, CKMB, CKMBINDEX, TROPONINI in the last 168 hours. BNP (last 3 results) No results for input(s): PROBNP in  the last 8760 hours. HbA1C: No results for input(s): HGBA1C in the last 72 hours. CBG: Recent Labs  Lab 11/23/18 1658 11/23/18 2243 11/24/18 0804 11/24/18 1201 11/24/18 1732  GLUCAP 386* 264* 167* 209* 218*   Lipid Profile: No results for input(s): CHOL, HDL, LDLCALC, TRIG, CHOLHDL, LDLDIRECT in the last 72 hours. Thyroid Function Tests: No results for input(s): TSH, T4TOTAL, FREET4, T3FREE, THYROIDAB in the last 72 hours. Anemia Panel: No results for input(s): VITAMINB12, FOLATE, FERRITIN, TIBC, IRON, RETICCTPCT in the last 72 hours. Urine analysis:    Component Value Date/Time   COLORURINE STRAW (A) 10/31/2018 1928   APPEARANCEUR CLEAR 10/31/2018 1928   LABSPEC 1.006 10/31/2018 1928   PHURINE 5.0 10/31/2018 1928   GLUCOSEU 50 (A) 10/31/2018 1928   HGBUR SMALL (A) 10/31/2018 1928   BILIRUBINUR NEGATIVE 10/31/2018 1928   KETONESUR 20 (A) 10/31/2018 1928   PROTEINUR NEGATIVE 10/31/2018 1928   UROBILINOGEN 0.2 12/18/2013 1402   NITRITE NEGATIVE 10/31/2018 1928   LEUKOCYTESUR NEGATIVE 10/31/2018 1928   Sepsis Labs: @LABRCNTIP (procalcitonin:4,lacticidven:4) ) Recent Results (from the past 240 hour(s))  Gram stain     Status: None   Collection Time: 11/16/18  9:00 PM   Specimen: Synovium; Body Fluid  Result Value Ref Range Status   Specimen Description SYNOVIAL  Final   Special Requests KNEE,JOINT  Final   Gram Stain   Final    ABUNDANT WBC PRESENT,BOTH PMN AND MONONUCLEAR NO ORGANISMS SEEN Performed at [facility], [address], [place], [place] [address]    Report Status 11/16/2018 FINAL  Final  Culture, body fluid-bottle     Status: Abnormal   Collection Time: 11/16/18  9:00 PM   Specimen: Synovium  Result Value Ref Range Status   Specimen Description   Final    SYNOVIAL Performed at [facility], [address] [place], [place] [address]    Special Requests   Final  KNEE,JOINT Performed at [facility], [address], [place], [place] [address]     Gram Stain   Final    GRAM POSITIVE COCCI AEROBIC AND ANAEROBIC Gram Stain Report Called to,Read Back By and Verified With: HARVEY N. AT [place] [id number] BY THOMPSON S. Performed at [facility], [address], [place], [place] [address]    Culture METHICILLIN RESISTANT STAPHYLOCOCCUS AUREUS (A)  Final   Report Status 11/21/2018 FINAL  Final   Organism ID, Bacteria METHICILLIN RESISTANT STAPHYLOCOCCUS AUREUS  Final      Susceptibility   Methicillin resistant staphylococcus aureus - MIC*    CIPROFLOXACIN >=8 RESISTANT Resistant     ERYTHROMYCIN >=8 RESISTANT Resistant     GENTAMICIN <=0.5 SENSITIVE Sensitive     OXACILLIN >=4 RESISTANT Resistant     TETRACYCLINE >=16 RESISTANT Resistant     VANCOMYCIN 1 SENSITIVE Sensitive     TRIMETH/SULFA >=320 RESISTANT Resistant     CLINDAMYCIN RESISTANT Resistant     RIFAMPIN <=0.5 SENSITIVE Sensitive     Inducible Clindamycin POSITIVE Resistant     * METHICILLIN RESISTANT STAPHYLOCOCCUS AUREUS  SARS CORONAVIRUS 2 (TAT 6-24 HRS) Nasopharyngeal Nasopharyngeal Swab     Status: None   Collection Time: 11/16/18 10:10 PM   Specimen: Nasopharyngeal Swab  Result Value Ref Range Status   SARS Coronavirus 2 NEGATIVE NEGATIVE Final    Comment: (NOTE) SARS-CoV-2 target nucleic acids are NOT DETECTED. The SARS-CoV-2 RNA is generally detectable in upper and lower respiratory specimens during the acute phase of infection. Negative results do not preclude SARS-CoV-2 infection, do not rule out co-infections with other pathogens, and should not be used as the sole basis for treatment or other patient management decisions. Negative results must be combined with clinical observations, patient history, and epidemiological information. The expected result is Negative. Fact Sheet for Patients: SugarRoll.be Fact Sheet for Healthcare Providers: https://www.woods-mathews.com/ This test is not yet approved or cleared by  the [place] FDA and  has been authorized for detection and/or diagnosis of SARS-CoV-2 by FDA under an Emergency Use Authorization (EUA). This EUA will remain  in effect (meaning this test can be used) for the duration of the COVID-19 declaration under Section 56 4(b)(1) of the Act, 21 U.S.C. section 360bbb-3(b)(1), unless the authorization is terminated or revoked sooner. Performed at [facility], [place] [address], [place], [place] [address]   Culture, blood (Routine X 2) w Reflex to ID Panel     Status: Abnormal   Collection Time: 11/16/18 11:16 PM   Specimen: BLOOD  Result Value Ref Range Status   Specimen Description   Final    BLOOD RIGHT ANTECUBITAL Performed at [facility], [address], [place], [place] [address]    Special Requests   Final    BOTTLES DRAWN AEROBIC AND ANAEROBIC Blood Culture adequate volume Performed at [facility], [address], [place], [place] [address]    Culture  Setup Time   Final    BOTH AEROBIC AND ANAEROBIC GRAM POSITIVE COCCI Gram Stain Report Called to,Read Back By and Verified With: K DULIL,RN @0400  11/18/18 [facility] Performed at [facility], [address] [place], [place] [address]    Culture METHICILLIN RESISTANT STAPHYLOCOCCUS AUREUS (A)  Final   Report Status 11/23/2018 FINAL  Final   Organism ID, Bacteria METHICILLIN RESISTANT STAPHYLOCOCCUS AUREUS  Final      Susceptibility   Methicillin resistant staphylococcus aureus - MIC*    CIPROFLOXACIN >=8 RESISTANT Resistant  ERYTHROMYCIN >=8 RESISTANT Resistant     GENTAMICIN <=0.5 SENSITIVE Sensitive     OXACILLIN >=4 RESISTANT Resistant     TETRACYCLINE >=16 RESISTANT Resistant     VANCOMYCIN 1 SENSITIVE Sensitive     TRIMETH/SULFA >=320 RESISTANT Resistant     CLINDAMYCIN RESISTANT Resistant     RIFAMPIN <=0.5 SENSITIVE Sensitive     Inducible Clindamycin POSITIVE Resistant     * METHICILLIN RESISTANT STAPHYLOCOCCUS AUREUS  Culture, blood (Routine X 2) w Reflex to ID  Panel     Status: None   Collection Time: 11/16/18 11:18 PM   Specimen: BLOOD RIGHT HAND  Result Value Ref Range Status   Specimen Description BLOOD RIGHT HAND  Final   Special Requests   Final    BOTTLES DRAWN AEROBIC AND ANAEROBIC Blood Culture adequate volume   Culture   Final    NO GROWTH 5 DAYS Performed at [facility], [address] [place], [place] [address]    Report Status 11/21/2018 FINAL  Final  Surgical pcr screen     Status: None   Collection Time: 11/17/18  2:59 PM   Specimen: Nasal Mucosa; Nasal Swab  Result Value Ref Range Status   MRSA, PCR NEGATIVE NEGATIVE Final   Staphylococcus aureus NEGATIVE NEGATIVE Final    Comment: (NOTE) The Xpert SA Assay (FDA approved for NASAL specimens in patients 54 years of age and older), is one component of a comprehensive surveillance program. It is not intended to diagnose infection nor to guide or monitor treatment. Performed at [facility], [place] [address], [place], [place] [address]   Aerobic/Anaerobic Culture (surgical/deep wound)     Status: None   Collection Time: 11/18/18 10:06 AM   Specimen: PATH Soft tissue  Result Value Ref Range Status   Specimen Description TISSUE  Final   Special Requests NONE  Final   Gram Stain   Final    ABUNDANT WBC PRESENT, PREDOMINANTLY PMN RARE GRAM POSITIVE COCCI    Culture   Final    FEW METHICILLIN RESISTANT STAPHYLOCOCCUS AUREUS NO ANAEROBES ISOLATED Performed at [facility], [address], [place], [place] [address]    Report Status 11/23/2018 FINAL  Final   Organism ID, Bacteria METHICILLIN RESISTANT STAPHYLOCOCCUS AUREUS  Final      Susceptibility   Methicillin resistant staphylococcus aureus - MIC*    CIPROFLOXACIN >=8 RESISTANT Resistant     ERYTHROMYCIN >=8 RESISTANT Resistant     GENTAMICIN <=0.5 SENSITIVE Sensitive     OXACILLIN >=4 RESISTANT Resistant     TETRACYCLINE >=16 RESISTANT Resistant     VANCOMYCIN 1 SENSITIVE Sensitive     TRIMETH/SULFA  >=320 RESISTANT Resistant     CLINDAMYCIN RESISTANT Resistant     RIFAMPIN <=0.5 SENSITIVE Sensitive     Inducible Clindamycin POSITIVE Resistant     * FEW METHICILLIN RESISTANT STAPHYLOCOCCUS AUREUS  Acid Fast Smear (AFB)     Status: None   Collection Time: 11/18/18 10:06 AM   Specimen: PATH Soft tissue  Result Value Ref Range Status   AFB Specimen Processing Concentration  Final   Acid Fast Smear Negative  Final    Comment: (NOTE) Performed At: [facility] [address] [place], [place] [id number] Rush Farmer MD [id number]    Source (AFB) TISSUE  Final    Comment: Performed at [facility], [place] [address], [place], [place] [address]  Culture, blood (routine x 2)     Status: None (Preliminary result)  Collection Time: 11/22/18  4:34 PM   Specimen: BLOOD  Result Value Ref Range Status   Specimen Description BLOOD LEFT ANTECUBITAL  Final   Special Requests   Final    BOTTLES DRAWN AEROBIC AND ANAEROBIC Blood Culture adequate volume   Culture   Final    NO GROWTH 2 DAYS Performed at [facility], [address] [place], [place] [address]    Report Status PENDING  Incomplete  Culture, blood (routine x 2)     Status: None (Preliminary result)   Collection Time: 11/22/18  4:35 PM   Specimen: BLOOD LEFT HAND  Result Value Ref Range Status   Specimen Description BLOOD LEFT HAND  Final   Special Requests   Final    BOTTLES DRAWN AEROBIC AND ANAEROBIC Blood Culture adequate volume   Culture   Final    NO GROWTH 2 DAYS Performed at [facility], [place] [address], [place], [place] [address]    Report Status PENDING  Incomplete  Aerobic/Anaerobic Culture (surgical/deep wound)     Status: None (Preliminary result)   Collection Time: 11/23/18 11:18 AM   Specimen: PATH Cytology Misc. fluid; Body Fluid  Result Value Ref Range Status   Specimen Description TISSUE SYNOVIAL LEFT KNEE  Final   Special Requests NONE  Final   Gram Stain   Final    FEW WBC  PRESENT, PREDOMINANTLY PMN NO ORGANISMS SEEN    Culture   Final    CULTURE REINCUBATED FOR BETTER GROWTH Performed at [facility], [place] [address], [place], [place] [address]    Report Status PENDING  Incomplete         Radiology Studies: [place] Ekg Site Rite  Result Date: 11/23/2018 If Site Rite image not attached, placement could not be confirmed due to current cardiac rhythm.     Scheduled Meds: . Chlorhexidine Gluconate Cloth  6 each Topical Daily  . Chlorhexidine Gluconate Cloth  6 each Topical Daily  . docusate sodium  100 mg Oral BID  . enoxaparin (LOVENOX) injection  40 mg Subcutaneous Q24H  . ferrous sulfate  325 mg Oral BID WC  . insulin aspart  0-9 Units Subcutaneous TID WC  . nicotine  21 mg Transdermal Daily  . oxyCODONE  5 mg Oral Q4H  . pentoxifylline  400 mg Oral TID WC  . polyethylene glycol  17 g Oral BID  . rosuvastatin  20 mg Oral Daily  . senna-docusate  2 tablet Oral QHS  . sodium chloride flush  10-40 mL Intracatheter Q12H  . vitamin B-12  1,000 mcg Oral Daily  . zolpidem  5 mg Oral QHS   Continuous Infusions: . sodium chloride 50 mL/hr at 11/23/18 1435  . lactated ringers Stopped (11/23/18 1231)  . [START ON 11/25/2018] vancomycin       LOS: 8 days      Elie Confer, MD [facility] Pager: [phone] [email or web address] Password [facility] 11/24/2018, 7:16 PM

## 2018-11-24 NOTE — Progress Notes (Signed)
PHARMACY CONSULT NOTE FOR:  OUTPATIENT  PARENTERAL ANTIBIOTIC THERAPY (OPAT)  Indication:  Prosthetic joint infection Regimen:  Vancomycin 1250 mg IV Q 12 hrs End date:  01/03/19  IV antibiotic discharge orders are pended. To discharging provider:  please sign these orders via discharge navigator,  Select New Orders & click on the button choice - Manage This Unsigned Work.     Thank you for allowing pharmacy to be a part of this patient's care.  Jimmy Footman, PharmD, BCPS, BCIDP Infectious Diseases Clinical Pharmacist Phone: [phone] 11/24/2018, 3:57 PM

## 2018-11-24 NOTE — TOC Progression Note (Addendum)
Transition of Care [facility]) - Progression Note    Patient Details  Name: Kristoper Boshears MRN: [id number] Date of Birth: Feb 08, 1965  Transition of [facility]) CM/SW Contact  Jacalyn Lefevre Edson Snowball, RN Phone Number: 11/24/2018, 11:21 AM  Clinical Narrative:     Patient wanting to return home with home health and IV ABX . Mateo Flow with [facility] and Pam with [facility] aware. Patient unsure if he will discharge by family car or PTAR. Confirmed face sheet address.   Patient has 3 in 1 and walker at home already  Messaged MD for [facility] and [facility] orders  Expected Discharge Plan: [place] Barriers to Discharge: Continued Medical Work up  Expected Discharge Plan and Services Expected Discharge Plan: [place]   Discharge Planning Services: CM Consult Post Acute Care Choice: [place] arrangements for the past 2 months: Single Family Home                 DME Arranged: N/A         HH Arranged: RN, PT [place] Agency: [place] ([facility]) Date HH Agency Contacted: 11/24/18 Time HH Agency Contacted: 1120 Representative spoke with at [place]: Mateo Flow with [facility] and Pam with [facility]   Social Determinants of Health ([place]) Interventions    Readmission Risk Interventions No flowsheet data found.

## 2018-11-24 NOTE — Progress Notes (Signed)
Physical Therapy Treatment Patient Details Name: [name] MRN: [id number] DOB: Aug 05, 1965 Today's Date: 11/24/2018    History of Present Illness Pt is a 53 y/o male admitted secondary to R knee pain. Pt is s/p irrigation and debridement right total knee arthroplasty arthroscopically. Cultures were found to have gram-positive cocci in all. S/p removal total knee with placement of an anatomic antibiotic spacer on 11/4. 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 Pt encouraged to keep RLE in extension and limit knee flexion during mobility, per MD orders "minimize ROM of knee". Pt may benefit from R knee immobilizer to limit knee ROM. Pt requires increased time for positioning, as pt is very particular with RLE elevation and heel support due to severe knee pain. PT to continue to follow acutely.   Follow Up Recommendations  Home health PT;Supervision/Assistance - 24 hour     Equipment Recommendations  None recommended by PT    Recommendations for Other Services       Precautions / Restrictions Precautions Precautions: Fall Other Brace: wrist splint to L (pt not using due to IV placement) Restrictions Weight Bearing Restrictions: No RLE Weight Bearing: Weight bearing as tolerated Other Position/Activity Restrictions: minimize ROM of knee per MD order    Mobility  Bed Mobility Overal bed mobility: Needs Assistance Bed Mobility: Supine to Sit     Supine to sit: Min assist;HOB elevated     General bed mobility comments: Min assist for RLE lifting and translation to EOB, PT holding at ankle and distal thigh to keep RLE as close to full extension as possible. Pt with increased time to scoot to EOB, lowering RLE to ground with R knee extension  Transfers Overall transfer level: Needs assistance Equipment used: Rolling walker  (2 wheeled) Transfers: Sit to/from Stand Sit to Stand: Min assist;From elevated surface         General transfer comment: Min assist for power up, steadying. Pt standing from very elevated bed position.  Ambulation/Gait Ambulation/Gait assistance: Min guard;Min assist Gait Distance (Feet): 12 Feet Assistive device: Rolling walker (2 wheeled) Gait Pattern/deviations: Step-to pattern;Decreased weight shift to right;Decreased stance time - right;Antalgic;Trunk flexed Gait velocity: decr   General Gait Details: Min guard for safety, occasional min assist for steadying and guiding pt to recliner. Pt with very decreased WB through RLE, keeping RLE relatively straight. Limited by fatigue.   Stairs             Wheelchair Mobility    Modified Rankin (Stroke Patients Only)       Balance Overall balance assessment: Needs assistance Sitting-balance support: No upper extremity supported;Feet supported Sitting balance-Leahy Scale: Fair     Standing balance support: Bilateral upper extremity supported;During functional activity Standing balance-Leahy Scale: Poor Standing balance comment: relaint on BUE and external support                            Cognition Arousal/Alertness: Awake/alert Behavior During Therapy: Anxious Overall Cognitive Status: Within Functional Limits for tasks assessed                                        Exercises Total Joint Exercises Ankle Circles/Pumps: AROM;Both;20 reps;Seated Quad Sets: Right;5  reps;Seated;AROM    General Comments        Pertinent Vitals/Pain Pain Assessment: 0-10 Pain Score: 8  Pain Location: R knee Pain Descriptors / Indicators: Sore;Cramping Pain Intervention(s): Limited activity within patient's tolerance;Monitored during session;Repositioned;Premedicated before session    Home Living                      Prior Function            PT Goals (current goals can now be found  in the care plan section) Acute Rehab PT Goals Patient Stated Goal: to get his infection cleared up PT Goal Formulation: With patient Time For Goal Achievement: 12/03/18 Potential to Achieve Goals: Good Progress towards PT goals: Progressing toward goals    Frequency    Min 3X/week      PT Plan Current plan remains appropriate    Co-evaluation              AM-PAC PT "6 Clicks" Mobility   Outcome Measure  Help needed turning from your back to your side while in a flat bed without using bedrails?: A Little Help needed moving from lying on your back to sitting on the side of a flat bed without using bedrails?: A Little Help needed moving to and from a bed to a chair (including a wheelchair)?: A Little Help needed standing up from a chair using your arms (e.g., wheelchair or bedside chair)?: A Little Help needed to walk in hospital room?: A Little Help needed climbing 3-5 steps with a railing? : A Lot 6 Click Score: 17    End of Session Equipment Utilized During Treatment: Gait belt Activity Tolerance: Patient limited by pain Patient left: with call bell/phone within reach;in chair(pt verbally agrees to press call button and wait for assist prior to mobilizing back to bed) Nurse Communication: Mobility status PT Visit Diagnosis: Other abnormalities of gait and mobility (R26.89);Pain Pain - Right/Left: Right Pain - part of body: Knee     Time: [id number] PT Time Calculation (min) (ACUTE ONLY): 32 min  Charges:  $Gait Training: 8-22 mins $Therapeutic Activity: 8-22 mins                     Laurine Kuyper E, PT Acute Rehabilitation Services Pager [phone]  Office [phone]   Fairy Ashlock D Seferina Brokaw 11/24/2018, 11:08 AM

## 2018-11-24 NOTE — Progress Notes (Signed)
Patient ID: [name], male   DOB: [birth year], 52 y.o.   MRN: [id number] Postoperative day 1 removal of total knee arthroplasty right knee and placement of an anatomic antibiotic spacer with 3 g of vancomycin and gentamicin in the antibiotic powder.  Soft tissue was sent for further cultures.  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  Patient states that he would like to be discharged with home [facility] nursing [facility].

## 2018-11-25 ENCOUNTER — Encounter: Payer: Medicare Other | Admitting: Physical Medicine and Rehabilitation

## 2018-11-25 LAB — GLUCOSE, CAPILLARY
Glucose-Capillary: 158 mg/dL — ABNORMAL HIGH (ref 70–99)
Glucose-Capillary: 227 mg/dL — ABNORMAL HIGH (ref 70–99)
Glucose-Capillary: 111 mg/dL — ABNORMAL HIGH (ref 70–99)
Glucose-Capillary: 166 mg/dL — ABNORMAL HIGH (ref 70–99)

## 2018-11-25 LAB — BASIC METABOLIC PANEL
Anion gap: 8 (ref 5–15)
BUN: 6 mg/dL (ref 6–20)
CO2: 28 mmol/L (ref 22–32)
Calcium: 8.4 mg/dL — ABNORMAL LOW (ref 8.9–10.3)
Chloride: 106 mmol/L (ref 98–111)
Creatinine, Ser: 0.88 mg/dL (ref 0.61–1.24)
GFR calc Af Amer: >60 mL/min (ref >60)
GFR calc non Af Amer: >60 mL/min (ref >60)
Glucose, Bld: 105 mg/dL — ABNORMAL HIGH (ref 70–99)
Potassium: 3.7 mmol/L (ref 3.5–5.1)
Sodium: 142 mmol/L (ref 135–145)

## 2018-11-25 LAB — MAGNESIUM: Magnesium: 1.8 mg/dL (ref 1.7–2.4)

## 2018-11-25 MED ORDER — VANCOMYCIN IV (FOR PTA / DISCHARGE USE ONLY): 1250.0000 mg | Freq: Two times a day (BID) | INTRAVENOUS | 0 refills | Status: DC

## 2018-11-25 NOTE — Plan of Care (Signed)
  Problem: Education: Goal: Knowledge of General Education information will improve Description: Including pain rating scale, medication(s)/side effects and non-pharmacologic comfort measures Outcome: Progressing   Problem: Health Behavior/Discharge Planning: Goal: Ability to manage health-related needs will improve Outcome: Progressing   Problem: Clinical Measurements: Goal: Ability to maintain clinical measurements within normal limits will improve Outcome: Progressing Goal: Will remain free from infection Outcome: Progressing Goal: Diagnostic test results will improve Outcome: Progressing Goal: Cardiovascular complication will be avoided Outcome: Progressing   Problem: Activity: Goal: Risk for activity intolerance will decrease Outcome: Progressing   Problem: Coping: Goal: Level of anxiety will decrease Outcome: Progressing   Problem: Elimination: Goal: Will not experience complications related to bowel motility Outcome: Progressing Goal: Will not experience complications related to urinary retention Outcome: Progressing   Problem: Pain Managment: Goal: General experience of comfort will improve Outcome: Progressing   Problem: Safety: Goal: Ability to remain free from injury will improve Outcome: Progressing   Problem: Skin Integrity: Goal: Risk for impaired skin integrity will decrease Outcome: Progressing   

## 2018-11-25 NOTE — Progress Notes (Signed)
Physical Therapy Treatment Patient Details Name: [name] MRN: [id number] DOB: December 14, 1965 Today's Date: 11/25/2018    History of Present Illness Pt is a 53 y/o male admitted secondary to R knee pain. Pt is s/p irrigation and debridement right total knee arthroplasty arthroscopically. Cultures were found to have gram-positive cocci in all. S/p removal total knee with placement of an anatomic antibiotic spacer on 11/4. 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 He was able to ambulate further than last session, demonstrating improved activity tolerance. He demonstrated improved safety awareness by giving himself cues to keep RW at safe distance during gait. He notes that his R knee is sore throughout the session. He states that although he has 5 stairs to entry at home, he doesn't feel as though he would have any difficulty with stair navigation at this time. Overall, he is progressing well at this time due to improvement in gait distance.   Follow Up Recommendations  Home health PT;Supervision/Assistance - 24 hour     Equipment Recommendations  None recommended by PT    Recommendations for Other Services       Precautions / Restrictions Precautions Precautions: Fall Other Brace: wrist splint to L (pt not using due to IV placement) Restrictions Weight Bearing Restrictions: No RLE Weight Bearing: Weight bearing as tolerated Other Position/Activity Restrictions: minimize ROM of knee per MD order    Mobility  Bed Mobility Overal bed mobility: Needs Assistance Bed Mobility: Supine to Sit     Supine to sit: Min assist;HOB elevated Sit to supine: Min guard   General bed mobility comments: Min assist for RLE lifting and translation to EOB, PT holding at ankle and distal thigh to keep RLE as close to full extension as possible. Pt with increased time to scoot to EOB, lowering RLE to  ground with R knee extension  Transfers Overall transfer level: Needs assistance Equipment used: Rolling walker (2 wheeled) Transfers: Sit to/from Stand Sit to Stand: Min assist;From elevated surface         General transfer comment: Min A for standing and controlled sitting; pt returned to sitting on elevated bed  Ambulation/Gait Ambulation/Gait assistance: Min guard;Min assist Gait Distance (Feet): 30 Feet Assistive device: Rolling walker (2 wheeled) Gait Pattern/deviations: Step-to pattern;Decreased weight shift to right;Decreased stance time - right;Antalgic;Trunk flexed;Decreased step length - left;Decreased step length - right Gait velocity: decr   General Gait Details: min guard for safety throughout; pt self-cued himself to remain in walker when ambulating; pt demonstrates minimal WB on R   Stairs             Wheelchair Mobility    Modified Rankin (Stroke Patients Only)       Balance Overall balance assessment: Needs assistance Sitting-balance support: No upper extremity supported;Feet supported Sitting balance-Leahy Scale: Fair Sitting balance - Comments: sat on EOB for ~15 seconds with B UE supported on bed before moving to supine   Standing balance support: Bilateral upper extremity supported;During functional activity Standing balance-Leahy Scale: Poor Standing balance comment: unable to maintain standing balance without B UE support                             Cognition Arousal/Alertness: Awake/alert Behavior During Therapy: WFL for tasks assessed/performed Overall Cognitive Status: Within Functional Limits for tasks assessed  Exercises Total Joint Exercises Ankle Circles/Pumps: AROM;Both;15 reps;Seated Quad Sets: Strengthening;Both;10 reps;Other (comment)(long sitting in chair)    General Comments        Pertinent Vitals/Pain Pain Assessment: Faces Pain Score: 7  Faces  Pain Scale: Hurts even more Pain Location: R knee Pain Descriptors / Indicators: Sore;Cramping Pain Intervention(s): Limited activity within patient's tolerance;Monitored during session;Repositioned    Home Living Family/patient expects to be discharged to:: Private residence Living Arrangements: Children Available Help at Discharge: Family;Available 24 hours/day Type of Home: House Home Access: Stairs to enter Entrance Stairs-Rails: Right;Left;Can reach both Home Layout: One level Home Equipment: Environmental consultant - 2 wheels;Cane - single point;Crutches;Shower seat      Prior Function Level of Independence: Independent          PT Goals (current goals can now be found in the care plan section) Acute Rehab PT Goals Patient Stated Goal: to get his infection cleared up Progress towards PT goals: Progressing toward goals    Frequency    Min 3X/week      PT Plan Current plan remains appropriate    Co-evaluation              AM-PAC PT "6 Clicks" Mobility   Outcome Measure  Help needed turning from your back to your side while in a flat bed without using bedrails?: A Little Help needed moving from lying on your back to sitting on the side of a flat bed without using bedrails?: A Little Help needed moving to and from a bed to a chair (including a wheelchair)?: A Little Help needed standing up from a chair using your arms (e.g., wheelchair or bedside chair)?: A Little Help needed to walk in hospital room?: A Little Help needed climbing 3-5 steps with a railing? : A Little 6 Click Score: 18    End of Session Equipment Utilized During Treatment: Gait belt Activity Tolerance: Patient limited by fatigue;Patient limited by pain Patient left: in bed;with call bell/phone within reach Nurse Communication: Mobility status PT Visit Diagnosis: Other abnormalities of gait and mobility (R26.89);Pain Pain - Right/Left: Right Pain - part of body: Knee     Time: 1421-1447 PT Time  Calculation (min) (ACUTE ONLY): 26 min  Charges:  $Gait Training: 8-22 mins $Therapeutic Exercise: 8-22 mins                    Silvana Newness, SPT  [place] Corine Solorio 11/25/2018, 3:15 PM

## 2018-11-25 NOTE — TOC Progression Note (Signed)
Transition of Care [facility]) - Progression Note    Patient Details  Name: [name] MRN: [id number] Date of Birth: Mar 03, 1965  [facility] of [facility]) CM/SW Contact  Jacalyn Lefevre Edson Snowball, RN Phone Number: 11/25/2018, 2:29 PM  Clinical Narrative:     Per MD discharge is planned for 11/26/18. Pam with [facility] and Mateo Flow with [facility] aware.  Expected Discharge Plan: [place] Barriers to Discharge: Continued Medical Work up  Expected Discharge Plan and Services Expected Discharge Plan: [place]   Discharge Planning Services: CM Consult Post Acute Care Choice: [place] arrangements for the past 2 months: Single Family Home                 DME Arranged: N/A         HH Arranged: RN, PT [place] Agency: [place] ([facility]) Date [place]: 11/24/18 Time HH Agency Contacted: 1120 Representative spoke with at [place]: Mateo Flow with [facility] and Pam with [employer]   Social Determinants of Health ([place]) Interventions    Readmission Risk Interventions No flowsheet data found.

## 2018-11-25 NOTE — Progress Notes (Addendum)
PROGRESS NOTE    [name]   [id number]  DOB: 1965-01-29  DOA: 11/16/2018 PCP: Lucia Gaskins, MD   Brief Narrative:  [name] is a 53 y/o male with DM2, CVA. 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 He drove to [place] last weekend to get his son and on the drive back he noted the swelling in his knee which extended down to his foot along with redness. He had pain as well. He did not not fevers or chills. He does not recall any injury to the knee. He made an appt with Dr Sharol Given on 10/28 for later in the week but then decided to come to the ED. He was last admitted 10/12-10/13 when he was brought in unresponsive due to overdose of Oxycodone.  In the ED, the knee was aspirated and he was stared on IV antibiotics for possible septic arthritis. Knee aspirated again on 10/30 in OR. Found to be MRSA +. On Vancomycin.  He is status post removal of hardware and placement of antibiotic spacer by Ortho.  Ambulating with physical therapy.  He will need 6 weeks of IV vancomycin. Ortho is okay for discharge.  Social worker is setting up home health services..     Subjective: He is sitting out in chair, alert and oriented x3.  Not in acute distress. Wound dressing done by primary nurse today.  He will need home health services prior to discharge.  Assessment & Plan:  Principal Problem:   Septic right knee joint -  MRSA bacteremia SIRS with tachycardia and leukocytosis MRSA bacteremia. Possible innoculation from foot ulcer.  Fluid aspirated- cell count 16,050, 93% neutrophils no organisms on gram stain and no crystals - 10/30 > s/p removal of 100 cc of pus like material - also has grown  MRSA He is status post hardware removal and placement of antibiotic spacer right knee.  PICC line was placed and patient will need 6 weeks of IV antibiotics.  He will also  need home wound care prior to discharge.   Active Problems: Insomnia Continue with Ambien      Diabetes mellitus type 2 in nonobese  His hemoglobin A1c is 6.4 Continue with sliding scale insulin Fingersticks before meals and at bedtime Hypoglycemic protocol.     Hypokalemia Monitor and replete    Anemia, microcytic Continue with iron supplementation and vitamin B12.   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   Left arm numbness and tingling - MRI brain negative Follow-up with outpatient EMG  Polysubstance abuse/prescription drug overdose. Judicious use of opiates for pain control   DVT prophylaxis: Lovenox Code Status: Full code Family Communication: Discussed with patient Disposition Plan: Possible discharge home tomorrow  Consultants:   ortho Procedures: None  Antimicrobials: Vancomycin Anti-infectives (From admission, onward)   Start     Dose/Rate Route Frequency Ordered Stop   11/25/18 0400  vancomycin (VANCOCIN) 1,250 mg in sodium chloride 0.9 % 250 mL IVPB     1,250 mg 166.7 mL/hr over 90 Minutes Intravenous Every 12 hours 11/24/18 1556     11/23/18 1137  vancomycin (VANCOCIN) powder  Status:  Discontinued       As needed 11/23/18 1137 11/23/18 1230   11/23/18 1000  ceFAZolin (ANCEF)  IVPB 2g/100 mL premix     2 g 200 mL/hr over 30 Minutes Intravenous On call to O.R. 11/23/18 0707 11/23/18 1102   11/21/18 1600  vancomycin (VANCOCIN) 1,500 mg in sodium chloride 0.9 % 500 mL IVPB  Status:  Discontinued     1,500 mg 250 mL/hr over 120 Minutes Intravenous Every 12 hours 11/21/18 1012 11/24/18 1556   11/20/18 0400  vancomycin (VANCOCIN) 1,250 mg in sodium chloride 0.9 % 250 mL IVPB  Status:  Discontinued     1,250 mg 166.7 mL/hr over 90 Minutes Intravenous Every 12 hours 11/19/18 1509  11/21/18 1012   11/19/18 1600  vancomycin (VANCOCIN) 2,000 mg in sodium chloride 0.9 % 500 mL IVPB     2,000 mg 250 mL/hr over 120 Minutes Intravenous  Once 11/19/18 1509 11/19/18 1816   11/18/18 0600  ceFAZolin (ANCEF) IVPB 2g/100 mL premix  Status:  Discontinued     2 g 200 mL/hr over 30 Minutes Intravenous On call to O.R. 11/17/18 2043 11/18/18 1052   11/17/18 1400  ceFAZolin (ANCEF) IVPB 2g/100 mL premix  Status:  Discontinued     2 g 200 mL/hr over 30 Minutes Intravenous Every 8 hours 11/17/18 1008 11/20/18 1307   11/17/18 1200  vancomycin (VANCOCIN) 1,250 mg in sodium chloride 0.9 % 250 mL IVPB  Status:  Discontinued     1,250 mg 166.7 mL/hr over 90 Minutes Intravenous Every 12 hours 11/17/18 0204 11/17/18 0730   11/17/18 0800  ceFEPIme (MAXIPIME) 2 g in sodium chloride 0.9 % 100 mL IVPB  Status:  Discontinued     2 g 200 mL/hr over 30 Minutes Intravenous Every 8 hours 11/17/18 0204 11/17/18 0730   11/17/18 0115  vancomycin (VANCOCIN) IVPB 1000 mg/200 mL premix     1,000 mg 200 mL/hr over 60 Minutes Intravenous  Once 11/17/18 0000 11/17/18 0254   11/17/18 0015  vancomycin (VANCOCIN) IVPB 1000 mg/200 mL premix     1,000 mg 200 mL/hr over 60 Minutes Intravenous  Once 11/17/18 0000 11/17/18 0153   11/16/18 2330  vancomycin (VANCOCIN) 2,000 mg in sodium chloride 0.9 % 500 mL IVPB  Status:  Discontinued     2,000 mg 250 mL/hr over 120 Minutes Intravenous  Once 11/16/18 2319 11/17/18 0000   11/16/18 2330  ceFEPIme (MAXIPIME) 2 g in sodium chloride 0.9 % 100 mL IVPB     2 g 200 mL/hr over 30 Minutes Intravenous  Once 11/16/18 2319 11/17/18 0045       Objective: Vitals:   11/24/18 1459 11/24/18 2118 11/24/18 2323 11/25/18 0540  BP: 131/64 131/69  135/72  Pulse: 87 85  82  Resp: 18   18  Temp: 98.1 F (36.7 C) 98.2 F (36.8 C) 98.2 F (36.8 C) 98.6 F (37 C)  TempSrc: Oral Oral Oral Oral  SpO2: 100% 100%  97%  Weight:      Height:        Intake/Output Summary (Last 24  hours) at 11/25/2018 1429 Last data filed at 11/25/2018 0910 Gross per 24 hour  Intake 101.85 ml  Output 4000 ml  Net -3898.15 ml   Filed Weights   11/20/18 0500 11/21/18 0500 11/23/18 0923  Weight: 84.3 kg 79.7 kg 79.7 kg    Examination: General exam: Appears comfortable  HEENT: PERRLA, oral mucosa moist, no sclera icterus or thrush Respiratory system: Clear to auscultation. Respiratory effort normal. Cardiovascular system: S1 & S2 heard,  No murmurs  Gastrointestinal system: Abdomen soft, non-tender, nondistended. Normal bowel  sounds   Central nervous system: Alert and oriented. No focal neurological deficits. Extremities: No cyanosis, clubbing - right knee in dressing with drain- right foot with transmetatarsal amputation is swollen Psychiatry:  Mood & affect appropriate.  Skin:    Left foot     Data Reviewed: I have personally reviewed following labs and imaging studies  CBC: Recent Labs  Lab 11/19/18 0201 11/21/18 0348 11/24/18 0336 11/25/18 0400  WBC 11.4* 10.6* 18.7* 4.7  HGB 9.6* 10.1* 7.3* 9.7*  HCT 29.2* 30.7* 22.7* 30.9*  MCV 79.1* 78.7* 79.9* 84.9  PLT 284 328 424* 0000000   Basic Metabolic Panel: Recent Labs  Lab 11/19/18 0201 11/21/18 0348 11/23/18 0537 11/24/18 0336 11/25/18 0400  NA 133* 132*  --  133* 142  K 3.9 3.8  --  4.1 3.7  CL 99 98  --  98 106  CO2 21* 23  --  24 28  GLUCOSE 153* 148*  --  227* 105*  BUN 16 15  --  14 6  CREATININE 1.00 0.84 0.78 0.81 0.88  CALCIUM 8.9 8.6*  --  8.6* 8.4*  MG  --   --   --  2.1 1.8   GFR: Estimated Creatinine Clearance: 106.9 mL/min (by C-G formula based on SCr of 0.88 mg/dL). Liver Function Tests: No results for input(s): AST, ALT, ALKPHOS, BILITOT, PROT, ALBUMIN in the last 168 hours. No results for input(s): LIPASE, AMYLASE in the last 168 hours. No results for input(s): AMMONIA in the last 168 hours. Coagulation Profile: No results for input(s): INR, PROTIME in the last 168 hours. Cardiac  Enzymes: No results for input(s): CKTOTAL, CKMB, CKMBINDEX, TROPONINI in the last 168 hours. BNP (last 3 results) No results for input(s): PROBNP in the last 8760 hours. HbA1C: No results for input(s): HGBA1C in the last 72 hours. CBG: Recent Labs  Lab 11/24/18 0804 11/24/18 1201 11/24/18 1732 11/25/18 0720 11/25/18 1214  GLUCAP 167* 209* 218* 158* 227*   Lipid Profile: No results for input(s): CHOL, HDL, LDLCALC, TRIG, CHOLHDL, LDLDIRECT in the last 72 hours. Thyroid Function Tests: No results for input(s): TSH, T4TOTAL, FREET4, T3FREE, THYROIDAB in the last 72 hours. Anemia Panel: No results for input(s): VITAMINB12, FOLATE, FERRITIN, TIBC, IRON, RETICCTPCT in the last 72 hours. Urine analysis:    Component Value Date/Time   COLORURINE STRAW (A) 10/31/2018 1928   APPEARANCEUR CLEAR 10/31/2018 1928   LABSPEC 1.006 10/31/2018 1928   PHURINE 5.0 10/31/2018 1928   GLUCOSEU 50 (A) 10/31/2018 1928   HGBUR SMALL (A) 10/31/2018 1928   BILIRUBINUR NEGATIVE 10/31/2018 1928   KETONESUR 20 (A) 10/31/2018 1928   PROTEINUR NEGATIVE 10/31/2018 1928   UROBILINOGEN 0.2 12/18/2013 1402   NITRITE NEGATIVE 10/31/2018 1928   LEUKOCYTESUR NEGATIVE 10/31/2018 1928   Sepsis Labs: @LABRCNTIP (procalcitonin:4,lacticidven:4) ) Recent Results (from the past 240 hour(s))  Gram stain     Status: None   Collection Time: 11/16/18  9:00 PM   Specimen: Synovium; Body Fluid  Result Value Ref Range Status   Specimen Description SYNOVIAL  Final   Special Requests KNEE,JOINT  Final   Gram Stain   Final    ABUNDANT WBC PRESENT,BOTH PMN AND MONONUCLEAR NO ORGANISMS SEEN Performed at [facility], [address] [place], [place] [address]    Report Status 11/16/2018 FINAL  Final  Culture, body fluid-bottle     Status: Abnormal   Collection Time: 11/16/18  9:00 PM   Specimen: Synovium  Result Value Ref Range Status   Specimen  Description   Final    SYNOVIAL Performed at [facility], [address], [place], [place] [address]    Special Requests   Final    KNEE,JOINT Performed at [facility], [address], [place], [place] [address]    Gram Stain   Final    GRAM POSITIVE COCCI AEROBIC AND ANAEROBIC Gram Stain Report Called to,Read Back By and Verified With: HARVEY N. AT [place] AT [place] [id number] BY THOMPSON S. Performed at [facility], [address] [place], [place] [address]    Culture METHICILLIN RESISTANT STAPHYLOCOCCUS AUREUS (A)  Final   Report Status 11/21/2018 FINAL  Final   Organism ID, Bacteria METHICILLIN RESISTANT STAPHYLOCOCCUS AUREUS  Final      Susceptibility   Methicillin resistant staphylococcus aureus - MIC*    CIPROFLOXACIN >=8 RESISTANT Resistant     ERYTHROMYCIN >=8 RESISTANT Resistant     GENTAMICIN <=0.5 SENSITIVE Sensitive     OXACILLIN >=4 RESISTANT Resistant     TETRACYCLINE >=16 RESISTANT Resistant     VANCOMYCIN 1 SENSITIVE Sensitive     TRIMETH/SULFA >=320 RESISTANT Resistant     CLINDAMYCIN RESISTANT Resistant     RIFAMPIN <=0.5 SENSITIVE Sensitive     Inducible Clindamycin POSITIVE Resistant     * METHICILLIN RESISTANT STAPHYLOCOCCUS AUREUS  SARS CORONAVIRUS 2 (TAT 6-24 HRS) Nasopharyngeal Nasopharyngeal Swab     Status: None   Collection Time: 11/16/18 10:10 PM   Specimen: Nasopharyngeal Swab  Result Value Ref Range Status   SARS Coronavirus 2 NEGATIVE NEGATIVE Final    Comment: (NOTE) SARS-CoV-2 target nucleic acids are NOT DETECTED. The SARS-CoV-2 RNA is generally detectable in upper and lower respiratory specimens during the acute phase of infection. Negative results do not preclude SARS-CoV-2 infection, do not rule out co-infections with other pathogens, and should not be used as the sole basis for treatment or other patient management decisions. Negative results must be combined with clinical observations, patient history, and epidemiological information. The expected result is Negative. Fact Sheet for  Patients: SugarRoll.be Fact Sheet for Healthcare Providers: https://www.woods-mathews.com/ This test is not yet approved or cleared by the [place] FDA and  has been authorized for detection and/or diagnosis of SARS-CoV-2 by FDA under an Emergency Use Authorization (EUA). This EUA will remain  in effect (meaning this test can be used) for the duration of the COVID-19 declaration under Section 56 4(b)(1) of the Act, 21 U.S.C. section 360bbb-3(b)(1), unless the authorization is terminated or revoked sooner. Performed at [facility], [place] [address] [place], [place] [address]   Culture, blood (Routine X 2) w Reflex to ID Panel     Status: Abnormal   Collection Time: 11/16/18 11:16 PM   Specimen: BLOOD  Result Value Ref Range Status   Specimen Description   Final    BLOOD RIGHT ANTECUBITAL Performed at [facility], [address] [place], [place] [address]    Special Requests   Final    BOTTLES DRAWN AEROBIC AND ANAEROBIC Blood Culture adequate volume Performed at [facility], [address], [place], [place] [address]    Culture  Setup Time   Final    BOTH AEROBIC AND ANAEROBIC GRAM POSITIVE COCCI Gram Stain Report Called to,Read Back By and Verified With: K DULIL,RN @0400  11/18/18 [facility] Performed at [facility], [address], [place],  [address]    Culture METHICILLIN RESISTANT STAPHYLOCOCCUS AUREUS (A)  Final   Report Status 11/23/2018 FINAL  Final   Organism ID, Bacteria  METHICILLIN RESISTANT STAPHYLOCOCCUS AUREUS  Final      Susceptibility   Methicillin resistant staphylococcus aureus - MIC*    CIPROFLOXACIN >=8 RESISTANT Resistant     ERYTHROMYCIN >=8 RESISTANT Resistant     GENTAMICIN <=0.5 SENSITIVE Sensitive     OXACILLIN >=4 RESISTANT Resistant     TETRACYCLINE >=16 RESISTANT Resistant     VANCOMYCIN 1 SENSITIVE Sensitive     TRIMETH/SULFA >=320 RESISTANT Resistant     CLINDAMYCIN RESISTANT Resistant      RIFAMPIN <=0.5 SENSITIVE Sensitive     Inducible Clindamycin POSITIVE Resistant     * METHICILLIN RESISTANT STAPHYLOCOCCUS AUREUS  Culture, blood (Routine X 2) w Reflex to ID Panel     Status: None   Collection Time: 11/16/18 11:18 PM   Specimen: BLOOD RIGHT HAND  Result Value Ref Range Status   Specimen Description BLOOD RIGHT HAND  Final   Special Requests   Final    BOTTLES DRAWN AEROBIC AND ANAEROBIC Blood Culture adequate volume   Culture   Final    NO GROWTH 5 DAYS Performed at [facility], [address] [place], [place] [address]    Report Status 11/21/2018 FINAL  Final  Surgical pcr screen     Status: None   Collection Time: 11/17/18  2:59 PM   Specimen: Nasal Mucosa; Nasal Swab  Result Value Ref Range Status   MRSA, PCR NEGATIVE NEGATIVE Final   Staphylococcus aureus NEGATIVE NEGATIVE Final    Comment: (NOTE) The Xpert SA Assay (FDA approved for NASAL specimens in patients 35 years of age and older), is one component of a comprehensive surveillance program. It is not intended to diagnose infection nor to guide or monitor treatment. Performed at [facility], [place] [address] [place], [place] [address]   Aerobic/Anaerobic Culture (surgical/deep wound)     Status: None   Collection Time: 11/18/18 10:06 AM   Specimen: PATH Soft tissue  Result Value Ref Range Status   Specimen Description TISSUE  Final   Special Requests NONE  Final   Gram Stain   Final    ABUNDANT WBC PRESENT, PREDOMINANTLY PMN RARE GRAM POSITIVE COCCI    Culture   Final    FEW METHICILLIN RESISTANT STAPHYLOCOCCUS AUREUS NO ANAEROBES ISOLATED Performed at [facility], [address], [place], [place] [address]    Report Status 11/23/2018 FINAL  Final   Organism ID, Bacteria METHICILLIN RESISTANT STAPHYLOCOCCUS AUREUS  Final      Susceptibility   Methicillin resistant staphylococcus aureus - MIC*    CIPROFLOXACIN >=8 RESISTANT Resistant     ERYTHROMYCIN >=8 RESISTANT Resistant      GENTAMICIN <=0.5 SENSITIVE Sensitive     OXACILLIN >=4 RESISTANT Resistant     TETRACYCLINE >=16 RESISTANT Resistant     VANCOMYCIN 1 SENSITIVE Sensitive     TRIMETH/SULFA >=320 RESISTANT Resistant     CLINDAMYCIN RESISTANT Resistant     RIFAMPIN <=0.5 SENSITIVE Sensitive     Inducible Clindamycin POSITIVE Resistant     * FEW METHICILLIN RESISTANT STAPHYLOCOCCUS AUREUS  Acid Fast Smear (AFB)     Status: None   Collection Time: 11/18/18 10:06 AM   Specimen: PATH Soft tissue  Result Value Ref Range Status   AFB Specimen Processing Concentration  Final   Acid Fast Smear Negative  Final    Comment: (NOTE) Performed At: [facility] [address] [place], [place] [id number] Rush Farmer MD [id number]    Source (AFB) TISSUE  Final  Comment: Performed at [facility], [place] [address], [place], [place] [address]  Culture, blood (routine x 2)     Status: None (Preliminary result)   Collection Time: 11/22/18  4:34 PM   Specimen: BLOOD  Result Value Ref Range Status   Specimen Description BLOOD LEFT ANTECUBITAL  Final   Special Requests   Final    BOTTLES DRAWN AEROBIC AND ANAEROBIC Blood Culture adequate volume   Culture   Final    NO GROWTH 3 DAYS Performed at [facility], [place] [address], [place], [place] [address]    Report Status PENDING  Incomplete  Culture, blood (routine x 2)     Status: None (Preliminary result)   Collection Time: 11/22/18  4:35 PM   Specimen: BLOOD LEFT HAND  Result Value Ref Range Status   Specimen Description BLOOD LEFT HAND  Final   Special Requests   Final    BOTTLES DRAWN AEROBIC AND ANAEROBIC Blood Culture adequate volume   Culture   Final    NO GROWTH 3 DAYS Performed at [facility], [place] [address] [place], [place] [address]    Report Status PENDING  Incomplete  Aerobic/Anaerobic Culture (surgical/deep wound)     Status: None (Preliminary result)   Collection Time: 11/23/18 11:18 AM   Specimen: PATH Cytology  Misc. fluid; Body Fluid  Result Value Ref Range Status   Specimen Description TISSUE SYNOVIAL LEFT KNEE  Final   Special Requests NONE  Final   Gram Stain   Final    FEW WBC PRESENT, PREDOMINANTLY PMN NO ORGANISMS SEEN Performed at [facility], [address], [place], [place] [address]    Culture   Final    RARE STAPHYLOCOCCUS AUREUS CRITICAL RESULT CALLED TO, READ BACK BY AND VERIFIED WITH: N. THOMPSON RN, AT 1053 11/25/18 BY D. VANHOOK REGARDING CULTURE GROWTH NO ANAEROBES ISOLATED; CULTURE IN PROGRESS FOR 5 DAYS    Report Status PENDING  Incomplete         Radiology Studies: [place] Ekg Site Rite  Result Date: 11/23/2018 If Site Rite image not attached, placement could not be confirmed due to current cardiac rhythm.     Scheduled Meds:  Chlorhexidine Gluconate Cloth  6 each Topical Daily   Chlorhexidine Gluconate Cloth  6 each Topical Daily   docusate sodium  100 mg Oral BID   enoxaparin (LOVENOX) injection  40 mg Subcutaneous Q24H   ferrous sulfate  325 mg Oral BID WC   insulin aspart  0-9 Units Subcutaneous TID WC   nicotine  21 mg Transdermal Daily   oxyCODONE  5 mg Oral Q4H   pentoxifylline  400 mg Oral TID WC   polyethylene glycol  17 g Oral BID   rosuvastatin  20 mg Oral Daily   senna-docusate  2 tablet Oral QHS   sodium chloride flush  10-40 mL Intracatheter Q12H   vitamin B-12  1,000 mcg Oral Daily   zolpidem  5 mg Oral QHS   Continuous Infusions:  sodium chloride Stopped (11/23/18 1702)   lactated ringers Stopped (11/23/18 1231)   vancomycin 1,250 mg (11/25/18 0425)     LOS: 9 days   Total time spent on this encounter is 35 minutes.  Elie Confer, MD [facility] Pager: [phone] [email or web address] Password [facility] 11/25/2018, 2:29 PM

## 2018-11-25 NOTE — Consult Note (Signed)
WOC consult requested for knee post-op wound.  Pt had surgery on 11/4. Dr Sharol Given of the ortho service is following for assessment and plan of care for this location; please refer to his team for further questions regarding topical treatment and further plan of care. Please re-consult if further assistance is needed.  Thank-you,  Julien Girt MSN, [place], [place], [place], [place]

## 2018-11-25 NOTE — TOC Progression Note (Signed)
Transition of Care [facility]) - Progression Note    Patient Details  Name: [name] MRN: [id number] Date of Birth: 07/03/1965  [facility]) CM/SW Contact  Jacalyn Lefevre Edson Snowball, RN Phone Number: 11/25/2018, 1:03 PM  Clinical Narrative:     Paged Attending Dr Junious Dresser regarding timing of  Discharge. He will discuss with DR Sharol Given.   [facility] and [facility] needing to know to prepare home care and antibiotics.   Expected Discharge Plan: [place] Barriers to Discharge: Continued Medical Work up  Expected Discharge Plan and Services Expected Discharge Plan: [place]   Discharge Planning Services: CM Consult Post Acute Care Choice: [place] arrangements for the past 2 months: Single Family Home                 DME Arranged: N/A         HH Arranged: RN, PT [place] Agency: [place] ([facility]) Date [place]: 11/24/18 Time HH Agency Contacted: 1120 Representative spoke with at [place]: Mateo Flow with [facility] and Pam with [employer]   Social Determinants of Health ([place]) Interventions    Readmission Risk Interventions No flowsheet data found.

## 2018-11-25 NOTE — Progress Notes (Signed)
POD#2 s/p  Removel of Right Total knee and placement of an anatomic antibiotic knee spacer. Some difficulty with pain control with PT. Dressing  Will be changed by nursing today.  Dressing dry. Re enforced . Distal CMS intact. Compartments soft.   A/P Dressing change by nursing as needed. WBAT follow up 1 week with Dr. Sharol Given

## 2018-11-25 NOTE — Progress Notes (Signed)
Occupational Therapy Treatment Patient Details Name: [name] MRN: [id number] DOB: 04/24/1965 Today's Date: 11/25/2018    History of present illness Pt is a 53 y/o male admitted secondary to R knee pain. Pt is s/p irrigation and debridement right total knee arthroplasty arthroscopically. Cultures were found to have gram-positive cocci in all. S/p removal total knee with placement of an anatomic antibiotic spacer on 11/4. 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 Simulated LB ADL tasks min - mod A sitting EOB. Pt stood from EOB to RW with min A for SPTs to [facility] and recliner min A. Pt requires increased time to complete tasks due to being very particular about technique and reminding therapist of multiple things that need to be done next before completing current tasks. OT will continue to follow acutely  Follow Up Recommendations  Home health OT;Supervision - Intermittent    Equipment Recommendations  3 in 1 bedside commode;Other (comment)(reacher, LH bath sponge)    Recommendations for Other Services      Precautions / Restrictions Precautions Precautions: Fall Restrictions Weight Bearing Restrictions: No RLE Weight Bearing: Weight bearing as tolerated Other Position/Activity Restrictions: minimize ROM of knee per MD order       Mobility Bed Mobility Overal bed mobility: Needs Assistance Bed Mobility: Supine to Sit     Supine to sit: Min assist;HOB elevated     General bed mobility comments: Min assist for RLE lifting and translation to EOB, PT holding at ankle and distal thigh to keep RLE as close to full extension as possible. Pt with increased time to scoot to EOB, lowering RLE to ground with R knee extension  Transfers Overall transfer level: Needs assistance Equipment used:  Rolling walker (2 wheeled) Transfers: Sit to/from Stand Sit to Stand: Min assist;From elevated surface         General transfer comment: Min assist for power up, steadying. Pt standing from  elevated bed position.    Balance Overall balance assessment: Needs assistance Sitting-balance support: No upper extremity supported;Feet supported Sitting balance-Leahy Scale: Fair Sitting balance - Comments: sitting balance EOB this session with supervision while performing seated therex   Standing balance support: Bilateral upper extremity supported;During functional activity Standing balance-Leahy Scale: Poor                             ADL either performed or assessed with clinical judgement   ADL Overall ADL's : Independent     Grooming: Wash/dry hands;Wash/dry face;Oral care;Sitting;Set up;Supervision/safety       Lower Body Bathing: Minimal assistance;Sitting/lateral leans;Sit to/from stand Lower Body Bathing Details (indicate cue type and reason): R knee pain, impaired balance and reliant on BUE support in standing      Lower Body Dressing: Moderate assistance;Sit to/from stand Lower Body Dressing Details (indicate cue type and reason): R knee pain, impaired balance and reliant on BUE support in standing  Toilet Transfer: Minimal assistance;Stand-pivot;RW;BSC;Cueing for safety;Cueing for sequencing   Toileting- Clothing Manipulation and Hygiene: Minimal assistance;Sit to/from stand;Sitting/lateral lean       Functional mobility during ADLs: Minimal assistance;Rolling walker General ADL Comments: pt limited by decreased functional use of L hand, R knee pain, impaired balance     Vision Baseline Vision/History: Wears glasses Patient Visual Report: No  change from baseline     Perception     Praxis      Cognition Arousal/Alertness: Awake/alert Behavior During Therapy: Anxious Overall Cognitive Status: Within Functional Limits for tasks assessed                                           Exercises     Shoulder Instructions       General Comments      Pertinent Vitals/ Pain       Pain Assessment: 0-10 Pain Score: 7  Pain Location: R knee Pain Descriptors / Indicators: Sore;Cramping Pain Intervention(s): Limited activity within patient's tolerance;Monitored during session;Repositioned;Patient requesting pain meds-RN notified  Home Living Family/patient expects to be discharged to:: Private residence Living Arrangements: Children Available Help at Discharge: Family;Available 24 hours/day Type of Home: House Home Access: Stairs to enter [employer] of Steps: 5 Entrance Stairs-Rails: Right;Left;Can reach both Home Layout: One level     Bathroom Shower/Tub: Tub/shower unit;Walk-in shower   Bathroom Toilet: Standard Bathroom Accessibility: Yes   Home Equipment: Walker - 2 wheels;Cane - single point;Crutches;Shower seat          Prior Functioning/Environment Level of Independence: Independent            Frequency  Min 2X/week        Progress Toward Goals  OT Goals(current goals can now be found in the care plan section)  Progress towards OT goals: Progressing toward goals     Plan Discharge plan remains appropriate    Co-evaluation                 AM-PAC OT "6 Clicks" Daily Activity     Outcome Measure   Help from another person eating meals?: None Help from another person taking care of personal grooming?: None Help from another person toileting, which includes using toliet, bedpan, or urinal?: A Lot Help from another person bathing (including washing, rinsing, drying)?: A Lot Help from another person to put on and taking off regular upper body clothing?: None Help from another person to put on and taking off regular lower body clothing?: A Lot 6 Click Score: 18    End of Session Equipment Utilized During Treatment: Gait belt;Rolling walker;Other (comment)(BSC)  OT  Visit Diagnosis: Other abnormalities of gait and mobility (R26.89);Muscle weakness (generalized) (M62.81);Pain Pain - Right/Left: Right Pain - part of body: Knee   Activity Tolerance Patient tolerated treatment well   Patient Left in chair;with call bell/phone within reach   Nurse Communication          Time: [id number] OT Time Calculation (min): 31 min  Charges: OT General Charges $OT Visit: 1 Visit OT Treatments $Self Care/Home Management : 8-22 mins $Therapeutic Activity: 8-22 mins     Britt Bottom 11/25/2018, 1:21 PM

## 2018-11-26 LAB — COMPREHENSIVE METABOLIC PANEL
ALT: 33 U/L (ref 0–44)
AST: 30 U/L (ref 15–41)
Albumin: 2.1 g/dL — ABNORMAL LOW (ref 3.5–5.0)
Alkaline Phosphatase: 160 U/L — ABNORMAL HIGH (ref 38–126)
Anion gap: 8 (ref 5–15)
BUN: 12 mg/dL (ref 6–20)
CO2: 26 mmol/L (ref 22–32)
Calcium: 8.5 mg/dL — ABNORMAL LOW (ref 8.9–10.3)
Chloride: 99 mmol/L (ref 98–111)
Creatinine, Ser: 0.97 mg/dL (ref 0.61–1.24)
GFR calc Af Amer: >60 mL/min (ref >60)
GFR calc non Af Amer: >60 mL/min (ref >60)
Glucose, Bld: 157 mg/dL — ABNORMAL HIGH (ref 70–99)
Potassium: 4.2 mmol/L (ref 3.5–5.1)
Sodium: 133 mmol/L — ABNORMAL LOW (ref 135–145)
Total Bilirubin: 0.5 mg/dL (ref 0.3–1.2)
Total Protein: 6.2 g/dL — ABNORMAL LOW (ref 6.5–8.1)

## 2018-11-26 LAB — CBC
HCT: 30.9 % — ABNORMAL LOW (ref 39.0–52.0)
HCT: 20.2 % — ABNORMAL LOW (ref 39.0–52.0)
Hemoglobin: 9.7 g/dL — ABNORMAL LOW (ref 13.0–17.0)
Hemoglobin: 6.6 g/dL — CL (ref 13.0–17.0)
MCH: 26.6 pg (ref 26.0–34.0)
MCH: 26 pg (ref 26.0–34.0)
MCHC: 31.4 g/dL (ref 30.0–36.0)
MCHC: 32.7 g/dL (ref 30.0–36.0)
MCV: 84.9 fL (ref 80.0–100.0)
MCV: 79.5 fL — ABNORMAL LOW (ref 80.0–100.0)
Platelets: 160 10*3/uL (ref 150–400)
Platelets: 575 10*3/uL — ABNORMAL HIGH (ref 150–400)
RBC: 3.64 MIL/uL — ABNORMAL LOW (ref 4.22–5.81)
RBC: 2.54 MIL/uL — ABNORMAL LOW (ref 4.22–5.81)
RDW: 15.1 % (ref 11.5–15.5)
RDW: 14.5 % (ref 11.5–15.5)
WBC: 4.7 10*3/uL (ref 4.0–10.5)
WBC: 17.7 10*3/uL — ABNORMAL HIGH (ref 4.0–10.5)
nRBC: 0 % (ref 0.0–0.2)
nRBC: 0 % (ref 0.0–0.2)

## 2018-11-26 LAB — GLUCOSE, CAPILLARY
Glucose-Capillary: 154 mg/dL — ABNORMAL HIGH (ref 70–99)
Glucose-Capillary: 253 mg/dL — ABNORMAL HIGH (ref 70–99)
Glucose-Capillary: 242 mg/dL — ABNORMAL HIGH (ref 70–99)
Glucose-Capillary: 272 mg/dL — ABNORMAL HIGH (ref 70–99)

## 2018-11-26 LAB — PHOSPHORUS: Phosphorus: 4.1 mg/dL (ref 2.5–4.6)

## 2018-11-26 LAB — PREPARE RBC (CROSSMATCH)

## 2018-11-26 LAB — MAGNESIUM: Magnesium: 2 mg/dL (ref 1.7–2.4)

## 2018-11-26 LAB — VANCOMYCIN, TROUGH: Vancomycin Tr: 18 ug/mL (ref 15–20)

## 2018-11-26 MED ORDER — CLONAZEPAM 0.5 MG PO TABS
0.5000 mg | ORAL_TABLET | Freq: Once | ORAL | Status: AC
  Administered 2018-11-26: 23:00:00 0.5 mg via ORAL
  Filled 2018-11-26: qty 1

## 2018-11-26 MED ORDER — SODIUM CHLORIDE 0.9% IV SOLUTION: Freq: Once | INTRAVENOUS | Status: DC

## 2018-11-26 NOTE — Progress Notes (Addendum)
PROGRESS NOTE    [name]   [id number]  DOB: [birth year]  DOA: 11/16/2018 PCP: Lucia Gaskins, MD   Brief Narrative:  [name] is a 53 y/o male with DM2, CVA. 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 He drove to [place] last weekend to get his son and on the drive back he noted the swelling in his knee which extended down to his foot along with redness. He had pain as well. He did not not fevers or chills. He does not recall any injury to the knee. He made an appt with Dr Sharol Given on 10/28 for later in the week but then decided to come to the ED. He was last admitted 10/12-10/13 when he was brought in unresponsive due to overdose of Oxycodone.  In the ED, the knee was aspirated and he was stared on IV antibiotics for possible septic arthritis. Knee aspirated again on 10/30 in OR. Found to be MRSA +. On Vancomycin.  He is status post removal of hardware and placement of antibiotic spacer by Ortho.  Ambulating with physical therapy.  He will need 6 weeks of IV vancomycin. Ortho is okay for discharge.  Social worker is setting up home health services. Patient claimed that he was not told that he is going to be discharged today, he has no ride home and can pick up his medications from his pharmacy today. I emphasized the patient about his discharge home tomorrow.  He agreed and verbalized understanding.  Plan is to discharge home tomorrow.     Subjective: He is laying in bed quietly and not in acute distress.  Is alert and oriented x3.  Stated that he is not aware about his discharge today.  I emphasized to him about discharge tomorrow and he verbalized understanding.  Arrangements in place for home health services on discharge.  Assessment & Plan:  Principal Problem:   Septic right knee joint -  MRSA bacteremia SIRS with tachycardia and leukocytosis  MRSA bacteremia. Possible innoculation from foot ulcer.  Fluid aspirated- cell count 16,050, 93% neutrophils no organisms on gram stain and no crystals - 10/30 > s/p removal of 100 cc of pus like material - also has grown  MRSA He is status post hardware removal and placement of antibiotic spacer right knee.  PICC line was placed and patient will need 6 weeks of IV antibiotics.  He will also need home wound care prior to discharge.   Active Problems: Insomnia Continue with Ambien      Diabetes mellitus type 2 in nonobese  His hemoglobin A1c is 6.4 Continue with sliding scale insulin Fingersticks before meals and at bedtime Hypoglycemic protocol.     Hypokalemia Monitor and replete    Anemia, microcytic Continue with iron supplementation and vitamin B12.   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   Left arm numbness and tingling - MRI brain negative Follow-up with outpatient EMG  Polysubstance abuse/prescription drug overdose. Judicious use of opiates for pain control   DVT prophylaxis: Lovenox Code Status: Full code Family Communication: Discussed with patient Disposition Plan: Possible discharge home tomorrow  Consultants:   ortho Procedures: None  Antimicrobials: Vancomycin Anti-infectives (From admission, onward)   Start     Dose/Rate Route  Frequency Ordered Stop   11/25/18 0400  vancomycin (VANCOCIN) 1,250 mg in sodium chloride 0.9 % 250 mL IVPB     1,250 mg 166.7 mL/hr over 90 Minutes Intravenous Every 12 hours 11/24/18 1556     11/25/18 0000  vancomycin IVPB     1,250 mg Intravenous Every 12 hours 11/25/18 1558 01/04/19 2359   11/23/18 1137  vancomycin (VANCOCIN) powder  Status:  Discontinued       As needed 11/23/18 1137 11/23/18 1230   11/23/18 1000  ceFAZolin (ANCEF) IVPB 2g/100 mL  premix     2 g 200 mL/hr over 30 Minutes Intravenous On call to O.R. 11/23/18 0707 11/23/18 1102   11/21/18 1600  vancomycin (VANCOCIN) 1,500 mg in sodium chloride 0.9 % 500 mL IVPB  Status:  Discontinued     1,500 mg 250 mL/hr over 120 Minutes Intravenous Every 12 hours 11/21/18 1012 11/24/18 1556   11/20/18 0400  vancomycin (VANCOCIN) 1,250 mg in sodium chloride 0.9 % 250 mL IVPB  Status:  Discontinued     1,250 mg 166.7 mL/hr over 90 Minutes Intravenous Every 12 hours 11/19/18 1509 11/21/18 1012   11/19/18 1600  vancomycin (VANCOCIN) 2,000 mg in sodium chloride 0.9 % 500 mL IVPB     2,000 mg 250 mL/hr over 120 Minutes Intravenous  Once 11/19/18 1509 11/19/18 1816   11/18/18 0600  ceFAZolin (ANCEF) IVPB 2g/100 mL premix  Status:  Discontinued     2 g 200 mL/hr over 30 Minutes Intravenous On call to O.R. 11/17/18 2043 11/18/18 1052   11/17/18 1400  ceFAZolin (ANCEF) IVPB 2g/100 mL premix  Status:  Discontinued     2 g 200 mL/hr over 30 Minutes Intravenous Every 8 hours 11/17/18 1008 11/20/18 1307   11/17/18 1200  vancomycin (VANCOCIN) 1,250 mg in sodium chloride 0.9 % 250 mL IVPB  Status:  Discontinued     1,250 mg 166.7 mL/hr over 90 Minutes Intravenous Every 12 hours 11/17/18 0204 11/17/18 0730   11/17/18 0800  ceFEPIme (MAXIPIME) 2 g in sodium chloride 0.9 % 100 mL IVPB  Status:  Discontinued     2 g 200 mL/hr over 30 Minutes Intravenous Every 8 hours 11/17/18 0204 11/17/18 0730   11/17/18 0115  vancomycin (VANCOCIN) IVPB 1000 mg/200 mL premix     1,000 mg 200 mL/hr over 60 Minutes Intravenous  Once 11/17/18 0000 11/17/18 0254   11/17/18 0015  vancomycin (VANCOCIN) IVPB 1000 mg/200 mL premix     1,000 mg 200 mL/hr over 60 Minutes Intravenous  Once 11/17/18 0000 11/17/18 0153   11/16/18 2330  vancomycin (VANCOCIN) 2,000 mg in sodium chloride 0.9 % 500 mL IVPB  Status:  Discontinued     2,000 mg 250 mL/hr over 120 Minutes Intravenous  Once 11/16/18 2319 11/17/18 0000   11/16/18  2330  ceFEPIme (MAXIPIME) 2 g in sodium chloride 0.9 % 100 mL IVPB     2 g 200 mL/hr over 30 Minutes Intravenous  Once 11/16/18 2319 11/17/18 0045       Objective: Vitals:   11/25/18 0540 11/25/18 1519 11/25/18 2115 11/26/18 0538  BP: 135/72 135/75 134/70 135/70  Pulse: 82 84 80 79  Resp: 18 18 18 18   Temp: 98.6 F (37 C) 98.1 F (36.7 C) 98.6 F (37 C) 98.4 F (36.9 C)  TempSrc: Oral Axillary Oral Oral  SpO2: 97% 100% 100% 99%  Weight:      Height:        Intake/Output Summary (Last 24  hours) at 11/26/2018 1303 Last data filed at 11/26/2018 1135 Gross per 24 hour  Intake 630 ml  Output 4000 ml  Net -3370 ml   Filed Weights   11/20/18 0500 11/21/18 0500 11/23/18 0923  Weight: 84.3 kg 79.7 kg 79.7 kg    Examination: General exam: Appears comfortable  HEENT: PERRLA, oral mucosa moist, no sclera icterus or thrush Respiratory system: Clear to auscultation. Respiratory effort normal. Cardiovascular system: S1 & S2 heard,  No murmurs  Gastrointestinal system: Abdomen soft, non-tender, nondistended. Normal bowel sounds   Central nervous system: Alert and oriented. No focal neurological deficits. Extremities: No cyanosis, clubbing - right knee in dressing with drain- right foot with transmetatarsal amputation is swollen Psychiatry:  Mood & affect appropriate.  Skin:    Left foot     Data Reviewed: I have personally reviewed following labs and imaging studies  CBC: Recent Labs  Lab 11/21/18 0348 11/24/18 0336 11/25/18 0400  WBC 10.6* 18.7* 4.7  HGB 10.1* 7.3* 9.7*  HCT 30.7* 22.7* 30.9*  MCV 78.7* 79.9* 84.9  PLT 328 424* 0000000   Basic Metabolic Panel: Recent Labs  Lab 11/21/18 0348 11/23/18 0537 11/24/18 0336 11/25/18 0400 11/26/18 0411  NA 132*  --  133* 142 133*  K 3.8  --  4.1 3.7 4.2  CL 98  --  98 106 99  CO2 23  --  24 28 26   GLUCOSE 148*  --  227* 105* 157*  BUN 15  --  14 6 12   CREATININE 0.84 0.78 0.81 0.88 0.97  CALCIUM 8.6*  --  8.6*  8.4* 8.5*  MG  --   --  2.1 1.8 2.0  PHOS  --   --   --   --  4.1   GFR: Estimated Creatinine Clearance: 97 mL/min (by C-G formula based on SCr of 0.97 mg/dL). Liver Function Tests: Recent Labs  Lab 11/26/18 0411  AST 30  ALT 33  ALKPHOS 160*  BILITOT 0.5  PROT 6.2*  ALBUMIN 2.1*   No results for input(s): LIPASE, AMYLASE in the last 168 hours. No results for input(s): AMMONIA in the last 168 hours. Coagulation Profile: No results for input(s): INR, PROTIME in the last 168 hours. Cardiac Enzymes: No results for input(s): CKTOTAL, CKMB, CKMBINDEX, TROPONINI in the last 168 hours. BNP (last 3 results) No results for input(s): PROBNP in the last 8760 hours. HbA1C: No results for input(s): HGBA1C in the last 72 hours. CBG: Recent Labs  Lab 11/25/18 1214 11/25/18 1713 11/25/18 2057 11/26/18 0748 11/26/18 1133  GLUCAP 227* 111* 166* 154* 253*   Lipid Profile: No results for input(s): CHOL, HDL, LDLCALC, TRIG, CHOLHDL, LDLDIRECT in the last 72 hours. Thyroid Function Tests: No results for input(s): TSH, T4TOTAL, FREET4, T3FREE, THYROIDAB in the last 72 hours. Anemia Panel: No results for input(s): VITAMINB12, FOLATE, FERRITIN, TIBC, IRON, RETICCTPCT in the last 72 hours. Urine analysis:    Component Value Date/Time   COLORURINE STRAW (A) 10/31/2018 1928   APPEARANCEUR CLEAR 10/31/2018 1928   LABSPEC 1.006 10/31/2018 1928   PHURINE 5.0 10/31/2018 1928   GLUCOSEU 50 (A) 10/31/2018 1928   HGBUR SMALL (A) 10/31/2018 1928   BILIRUBINUR NEGATIVE 10/31/2018 1928   KETONESUR 20 (A) 10/31/2018 1928   PROTEINUR NEGATIVE 10/31/2018 1928   UROBILINOGEN 0.2 12/18/2013 1402   NITRITE NEGATIVE 10/31/2018 1928   LEUKOCYTESUR NEGATIVE 10/31/2018 1928   Sepsis Labs: @LABRCNTIP (procalcitonin:4,lacticidven:4) ) Recent Results (from the past 240 hour(s))  Gram stain  Status: None   Collection Time: 11/16/18  9:00 PM   Specimen: Synovium; Body Fluid  Result Value Ref Range  Status   Specimen Description SYNOVIAL  Final   Special Requests KNEE,JOINT  Final   Gram Stain   Final    ABUNDANT WBC PRESENT,BOTH PMN AND MONONUCLEAR NO ORGANISMS SEEN Performed at [facility], [address] [place], [place] [address]    Report Status 11/16/2018 FINAL  Final  Culture, body fluid-bottle     Status: Abnormal   Collection Time: 11/16/18  9:00 PM   Specimen: Synovium  Result Value Ref Range Status   Specimen Description   Final    SYNOVIAL Performed at [facility], [address] [place], [place] [address]    Special Requests   Final    KNEE,JOINT Performed at [facility], [address] [place], [place] [address]    Gram Stain   Final    GRAM POSITIVE COCCI AEROBIC AND ANAEROBIC Gram Stain Report Called to,Read Back By and Verified With: HARVEY N. AT [place] [id number] BY THOMPSON S. Performed at [facility], [address], [place], [place] [address]    Culture METHICILLIN RESISTANT STAPHYLOCOCCUS AUREUS (A)  Final   Report Status 11/21/2018 FINAL  Final   Organism ID, Bacteria METHICILLIN RESISTANT STAPHYLOCOCCUS AUREUS  Final      Susceptibility   Methicillin resistant staphylococcus aureus - MIC*    CIPROFLOXACIN >=8 RESISTANT Resistant     ERYTHROMYCIN >=8 RESISTANT Resistant     GENTAMICIN <=0.5 SENSITIVE Sensitive     OXACILLIN >=4 RESISTANT Resistant     TETRACYCLINE >=16 RESISTANT Resistant     VANCOMYCIN 1 SENSITIVE Sensitive     TRIMETH/SULFA >=320 RESISTANT Resistant     CLINDAMYCIN RESISTANT Resistant     RIFAMPIN <=0.5 SENSITIVE Sensitive     Inducible Clindamycin POSITIVE Resistant     * METHICILLIN RESISTANT STAPHYLOCOCCUS AUREUS  SARS CORONAVIRUS 2 (TAT 6-24 HRS) Nasopharyngeal Nasopharyngeal Swab     Status: None   Collection Time: 11/16/18 10:10 PM   Specimen: Nasopharyngeal Swab  Result Value Ref Range Status   SARS Coronavirus 2 NEGATIVE NEGATIVE Final    Comment: (NOTE) SARS-CoV-2 target nucleic acids are NOT DETECTED.  The SARS-CoV-2 RNA is generally detectable in upper and lower respiratory specimens during the acute phase of infection. Negative results do not preclude SARS-CoV-2 infection, do not rule out co-infections with other pathogens, and should not be used as the sole basis for treatment or other patient management decisions. Negative results must be combined with clinical observations, patient history, and epidemiological information. The expected result is Negative. Fact Sheet for Patients: SugarRoll.be Fact Sheet for Healthcare Providers: https://www.woods-mathews.com/ This test is not yet approved or cleared by the [place] FDA and  has been authorized for detection and/or diagnosis of SARS-CoV-2 by FDA under an Emergency Use Authorization (EUA). This EUA will remain  in effect (meaning this test can be used) for the duration of the COVID-19 declaration under Section 56 4(b)(1) of the Act, 21 U.S.C. section 360bbb-3(b)(1), unless the authorization is terminated or revoked sooner. Performed at [facility], [place] [address], [place], [place] [address]   Culture, blood (Routine X 2) w Reflex to ID Panel     Status: Abnormal   Collection Time: 11/16/18 11:16 PM   Specimen: BLOOD  Result Value Ref Range Status   Specimen Description   Final    BLOOD RIGHT ANTECUBITAL Performed at [facility], [address], [place], [place] [address]    Special Requests   Final    BOTTLES DRAWN AEROBIC AND ANAEROBIC Blood Culture adequate volume Performed at [facility], [address] [place], [place] [address]    Culture  Setup Time   Final    BOTH AEROBIC AND ANAEROBIC GRAM POSITIVE COCCI Gram Stain Report Called to,Read Back By and Verified With: K DULIL,RN @0400  11/18/18 MKELLY Performed at [facility], [address], [place], [place] [address]    Culture METHICILLIN RESISTANT STAPHYLOCOCCUS AUREUS (A)  Final   Report Status 11/23/2018  FINAL  Final   Organism ID, Bacteria METHICILLIN RESISTANT STAPHYLOCOCCUS AUREUS  Final      Susceptibility   Methicillin resistant staphylococcus aureus - MIC*    CIPROFLOXACIN >=8 RESISTANT Resistant     ERYTHROMYCIN >=8 RESISTANT Resistant     GENTAMICIN <=0.5 SENSITIVE Sensitive     OXACILLIN >=4 RESISTANT Resistant     TETRACYCLINE >=16 RESISTANT Resistant     VANCOMYCIN 1 SENSITIVE Sensitive     TRIMETH/SULFA >=320 RESISTANT Resistant     CLINDAMYCIN RESISTANT Resistant     RIFAMPIN <=0.5 SENSITIVE Sensitive     Inducible Clindamycin POSITIVE Resistant     * METHICILLIN RESISTANT STAPHYLOCOCCUS AUREUS  Culture, blood (Routine X 2) w Reflex to ID Panel     Status: None   Collection Time: 11/16/18 11:18 PM   Specimen: BLOOD RIGHT HAND  Result Value Ref Range Status   Specimen Description BLOOD RIGHT HAND  Final   Special Requests   Final    BOTTLES DRAWN AEROBIC AND ANAEROBIC Blood Culture adequate volume   Culture   Final    NO GROWTH 5 DAYS Performed at [facility], [address], [place], [place] [address]    Report Status 11/21/2018 FINAL  Final  Surgical pcr screen     Status: None   Collection Time: 11/17/18  2:59 PM   Specimen: Nasal Mucosa; Nasal Swab  Result Value Ref Range Status   MRSA, PCR NEGATIVE NEGATIVE Final   Staphylococcus aureus NEGATIVE NEGATIVE Final    Comment: (NOTE) The Xpert SA Assay (FDA approved for NASAL specimens in patients 63 years of age and older), is one component of a comprehensive surveillance program. It is not intended to diagnose infection nor to guide or monitor treatment. Performed at [facility], [place] [address] [place], [place] [address]   Aerobic/Anaerobic Culture (surgical/deep wound)     Status: None   Collection Time: 11/18/18 10:06 AM   Specimen: PATH Soft tissue  Result Value Ref Range Status   Specimen Description TISSUE  Final   Special Requests NONE  Final   Gram Stain   Final    ABUNDANT WBC PRESENT,  PREDOMINANTLY PMN RARE GRAM POSITIVE COCCI    Culture   Final    FEW METHICILLIN RESISTANT STAPHYLOCOCCUS AUREUS NO ANAEROBES ISOLATED Performed at [facility], [address] [place], [place] [address]    Report Status 11/23/2018 FINAL  Final   Organism ID, Bacteria METHICILLIN RESISTANT STAPHYLOCOCCUS AUREUS  Final      Susceptibility   Methicillin resistant staphylococcus aureus - MIC*    CIPROFLOXACIN >=8 RESISTANT Resistant     ERYTHROMYCIN >=8 RESISTANT Resistant     GENTAMICIN <=0.5 SENSITIVE Sensitive     OXACILLIN >=4 RESISTANT Resistant     TETRACYCLINE >=16 RESISTANT Resistant     VANCOMYCIN 1 SENSITIVE Sensitive     TRIMETH/SULFA >=320 RESISTANT Resistant  CLINDAMYCIN RESISTANT Resistant     RIFAMPIN <=0.5 SENSITIVE Sensitive     Inducible Clindamycin POSITIVE Resistant     * FEW METHICILLIN RESISTANT STAPHYLOCOCCUS AUREUS  Acid Fast Smear (AFB)     Status: None   Collection Time: 11/18/18 10:06 AM   Specimen: PATH Soft tissue  Result Value Ref Range Status   AFB Specimen Processing Concentration  Final   Acid Fast Smear Negative  Final    Comment: (NOTE) Performed At: [facility] [place], [place] [id number] Rush Farmer MD [id number]    Source (AFB) TISSUE  Final    Comment: Performed at [facility], [place] [address] [place], [place] [address]  Culture, blood (routine x 2)     Status: None (Preliminary result)   Collection Time: 11/22/18  4:34 PM   Specimen: BLOOD  Result Value Ref Range Status   Specimen Description BLOOD LEFT ANTECUBITAL  Final   Special Requests   Final    BOTTLES DRAWN AEROBIC AND ANAEROBIC Blood Culture adequate volume   Culture   Final    NO GROWTH 4 DAYS Performed at [facility], [place] [address] [place], [place] [address]    Report Status PENDING  Incomplete  Culture, blood (routine x 2)     Status: None (Preliminary result)   Collection Time: 11/22/18  4:35 PM   Specimen: BLOOD  LEFT HAND  Result Value Ref Range Status   Specimen Description BLOOD LEFT HAND  Final   Special Requests   Final    BOTTLES DRAWN AEROBIC AND ANAEROBIC Blood Culture adequate volume   Culture   Final    NO GROWTH 4 DAYS Performed at [facility], [place] [address], [place], [place] [address]    Report Status PENDING  Incomplete  Aerobic/Anaerobic Culture (surgical/deep wound)     Status: None (Preliminary result)   Collection Time: 11/23/18 11:18 AM   Specimen: PATH Cytology Misc. fluid; Body Fluid  Result Value Ref Range Status   Specimen Description TISSUE SYNOVIAL LEFT KNEE  Final   Special Requests NONE  Final   Gram Stain   Final    FEW WBC PRESENT, PREDOMINANTLY PMN NO ORGANISMS SEEN    Culture   Final    RARE STAPHYLOCOCCUS AUREUS CRITICAL RESULT CALLED TO, READ BACK BY AND VERIFIED WITH: N. THOMPSON RN, AT 1053 11/25/18 BY D. VANHOOK REGARDING CULTURE GROWTH NO ANAEROBES ISOLATED; CULTURE IN PROGRESS FOR 5 DAYS    Report Status PENDING  Incomplete   Organism ID, Bacteria STAPHYLOCOCCUS AUREUS  Final      Susceptibility   Staphylococcus aureus - MIC*    CIPROFLOXACIN >=8 RESISTANT Resistant     ERYTHROMYCIN >=8 RESISTANT Resistant     GENTAMICIN <=0.5 SENSITIVE Sensitive     OXACILLIN >=4 RESISTANT Resistant     TETRACYCLINE >=16 RESISTANT Resistant     VANCOMYCIN <=0.5 SENSITIVE Sensitive     TRIMETH/SULFA >=320 RESISTANT Resistant     CLINDAMYCIN RESISTANT Resistant     RIFAMPIN <=0.5 SENSITIVE Sensitive     Inducible Clindamycin Value in next row Resistant      POSITIVEPerformed at [facility] [address], [place], [place] [address]    * RARE STAPHYLOCOCCUS AUREUS         Radiology Studies: No results found.    Scheduled Meds: . Chlorhexidine Gluconate Cloth  6 each Topical Daily  . Chlorhexidine Gluconate Cloth  6 each Topical Daily  . docusate  sodium  100 mg Oral BID  . enoxaparin (LOVENOX) injection  40 mg Subcutaneous Q24H  .  ferrous sulfate  325 mg Oral BID WC  . insulin aspart  0-9 Units Subcutaneous TID WC  . nicotine  21 mg Transdermal Daily  . oxyCODONE  5 mg Oral Q4H  . pentoxifylline  400 mg Oral TID WC  . polyethylene glycol  17 g Oral BID  . rosuvastatin  20 mg Oral Daily  . senna-docusate  2 tablet Oral QHS  . sodium chloride flush  10-40 mL Intracatheter Q12H  . vitamin B-12  1,000 mcg Oral Daily  . zolpidem  5 mg Oral QHS   Continuous Infusions: . sodium chloride Stopped (11/23/18 1702)  . lactated ringers Stopped (11/23/18 1231)  . vancomycin 1,250 mg (11/26/18 0455)     LOS: 10 days   Total time spent on this encounter is 35 minutes.  Elie Confer, MD [facility] Pager: [phone] [email or web address] Password [email or web address] 11/26/2018, 1:03 PM

## 2018-11-26 NOTE — TOC Progression Note (Signed)
Transition of Care [facility]) - Progression Note    Patient Details  Name: [name] MRN: [id number] Date of Birth: 10/24/1965  Transition of Care [facility]) CM/SW [place], [place] Phone Number: 11/26/2018, 9:56 AM  Clinical Narrative:    Pt HH has been arranged and home infusion coordinated. Pam with [facility] and Mateo Flow with [facility] aware. Per MD pt does not have a ride home today. CSW has offered to provide cab voucher or PTAR ride home. This arrangement has been discussed with MD, bedside RN Arville Go and charge RN [phone].   Expected Discharge Plan: [place] Barriers to Discharge: Continued Medical Work up  Expected Discharge Plan and Services Expected Discharge Plan: [place] Discharge Planning Services: CM Consult Post Acute Care Choice: [place] arrangements for the past 2 months: Single Family Home             DME Arranged: N/A HH Arranged: RN, PT [place] Agency: [place] ([facility]) Date [place]: 11/24/18 Time HH Agency Contacted: 1120 Representative spoke with at [place]: Mateo Flow with [facility] and Pam with [employer]   Social Determinants of Health ([place]) Interventions    Readmission Risk Interventions No flowsheet data found.

## 2018-11-26 NOTE — Progress Notes (Signed)
Occupational Therapy Treatment Patient Details Name: [name] MRN: [id number] DOB: 09/19/1965 Today's Date: 11/26/2018    History of present illness Pt is a 53 y/o male admitted secondary to R knee pain. Pt is s/p irrigation and debridement right total knee arthroplasty arthroscopically. Cultures were found to have gram-positive cocci in all. S/p removal total knee with placement of an anatomic antibiotic spacer on 11/4. PMH including but not limited to DM, ETOH abuse, R transmetatarsal amputation (05/2018)   OT comments  Pt admitted with removal total knee with placement of an anatomic antibiotic spacer on 11/4.Marland Kitchen Pt currently with functional limitations due to the deficits listed below (see OT Problem List). Pt completed transfers with mod I to standing and min assist to sitting for RLE placement. Pt requires min assist with dressing RLE. Pt will benefit from skilled OT to increase their safety and independence with ADL and functional mobility for ADL to facilitate discharge to venue listed below.     Follow Up Recommendations  Home health OT;Supervision - Intermittent    Equipment Recommendations  3 in 1 bedside commode;Other (comment)    Recommendations for Other Services      Precautions / Restrictions Precautions Precautions: Fall Precaution Comments: R knee drain Required Braces or Orthoses: Other Brace Other Brace: wrist splint to L (pt not using due to IV placement) Restrictions Weight Bearing Restrictions: No RLE Weight Bearing: Weight bearing as tolerated Other Position/Activity Restrictions: minimize ROM of knee per MD order       Mobility Bed Mobility Overal bed mobility: Needs Assistance Bed Mobility: Supine to Sit     Supine to sit: Min assist        Transfers Overall transfer level: Needs assistance Equipment used: Rolling walker (2 wheeled) Transfers: Sit to/from Stand Sit to Stand: Modified independent (Device/Increase time);From elevated surface          General transfer comment: to returnt o sitting need assist with positioning LE    Balance Overall balance assessment: Needs assistance Sitting-balance support: No upper extremity supported;Feet supported Sitting balance-Leahy Scale: Good                                     ADL either performed or assessed with clinical judgement   ADL Overall ADL's : Independent     Grooming: Wash/dry hands;Wash/dry face;Oral care;Sitting;Set up;Supervision/safety Grooming Details (indicate cue type and reason): decreased functional use of L UE  Upper Body Bathing: Sitting;Set up   Lower Body Bathing: Minimal assistance;Sitting/lateral leans;Sit to/from stand Lower Body Bathing Details (indicate cue type and reason): R knee pain, impaired balance and reliant on BUE support in standing  Upper Body Dressing : Set up;Sitting   Lower Body Dressing: Moderate assistance;Sit to/from stand Lower Body Dressing Details (indicate cue type and reason): R knee pain, impaired balance and reliant on BUE support in standing  Toilet Transfer: Minimal assistance;Stand-pivot;RW;BSC;Cueing for safety;Cueing for sequencing   Toileting- Clothing Manipulation and Hygiene: Minimal assistance;Sit to/from stand;Sitting/lateral lean Toileting - Clothing Manipulation Details (indicate cue type and reason): hygiene seated with supervision, assist for clothing mgmt     Functional mobility during ADLs: Minimal assistance;Rolling walker General ADL Comments: pt limited by decreased functional use of L hand, R knee pain, impaired balance     Vision       Perception     Praxis      Cognition Arousal/Alertness: Awake/alert Behavior During Therapy: WFL for tasks  assessed/performed Overall Cognitive Status: Within Functional Limits for tasks assessed                                          Exercises Exercises: Total Joint   Shoulder Instructions       General Comments       Pertinent Vitals/ Pain       Pain Assessment: 0-10 Pain Score: 9  Pain Location: R knee Pain Descriptors / Indicators: Sore;Cramping Pain Intervention(s): Limited activity within patient's tolerance;Monitored during session;Repositioned(nurse gave medication)  Home Living                                          Prior Functioning/Environment              Frequency  Min 2X/week        Progress Toward Goals  OT Goals(current goals can now be found in the care plan section)  Progress towards OT goals: Progressing toward goals  Acute Rehab OT Goals Patient Stated Goal: to get his infection cleared up OT Goal Formulation: With patient Time For Goal Achievement: 12/04/18 Potential to Achieve Goals: Good ADL Goals Pt Will Perform Grooming: with modified independence;standing;sitting Pt Will Perform Lower Body Dressing: sit to/from stand;with adaptive equipment;with supervision Pt Will Transfer to Toilet: ambulating;bedside commode;with supervision Pt Will Perform Toileting - Clothing Manipulation and hygiene: with modified independence;sit to/from stand;sitting/lateral leans Pt/caregiver will Perform Home Exercise Program: Increased ROM;With written HEP provided;Left upper extremity;Independently  Plan Discharge plan remains appropriate    Co-evaluation                 AM-PAC OT "6 Clicks" Daily Activity     Outcome Measure   Help from another person eating meals?: None Help from another person taking care of personal grooming?: None Help from another person toileting, which includes using toliet, bedpan, or urinal?: A Lot Help from another person bathing (including washing, rinsing, drying)?: A Lot Help from another person to put on and taking off regular upper body clothing?: None Help from another person to put on and taking off regular lower body clothing?: A Lot 6 Click Score: 18    End of Session Equipment Utilized During Treatment:  Gait belt;Rolling walker;Other (comment)  OT Visit Diagnosis: Other abnormalities of gait and mobility (R26.89);Muscle weakness (generalized) (M62.81);Pain Pain - Right/Left: Right Pain - part of body: Knee   Activity Tolerance Patient tolerated treatment well   Patient Left in chair;with call bell/phone within reach   Nurse Communication Mobility status        Time: [id number] OT Time Calculation (min): 23 min  Charges: OT General Charges $OT Visit: 1 Visit OT Treatments $Self Care/Home Management : 23-37 mins  Joeseph Amor OTR/L  Acute Rehab Services  [phone] office number [phone] pager number    Joeseph Amor 11/26/2018, 1:38 PM

## 2018-11-27 ENCOUNTER — Inpatient Hospital Stay (HOSPITAL_COMMUNITY): Payer: Medicare Other

## 2018-11-27 LAB — CBC
HCT: 23.4 % — ABNORMAL LOW (ref 39.0–52.0)
HCT: 28.8 % — ABNORMAL LOW (ref 39.0–52.0)
Hemoglobin: 7.6 g/dL — ABNORMAL LOW (ref 13.0–17.0)
Hemoglobin: 9.6 g/dL — ABNORMAL LOW (ref 13.0–17.0)
MCH: 26.9 pg (ref 26.0–34.0)
MCH: 27 pg (ref 26.0–34.0)
MCHC: 32.5 g/dL (ref 30.0–36.0)
MCHC: 33.3 g/dL (ref 30.0–36.0)
MCV: 82.7 fL (ref 80.0–100.0)
MCV: 81.1 fL (ref 80.0–100.0)
Platelets: 557 10*3/uL — ABNORMAL HIGH (ref 150–400)
Platelets: 600 10*3/uL — ABNORMAL HIGH (ref 150–400)
RBC: 2.83 MIL/uL — ABNORMAL LOW (ref 4.22–5.81)
RBC: 3.55 MIL/uL — ABNORMAL LOW (ref 4.22–5.81)
RDW: 15.3 % (ref 11.5–15.5)
RDW: 14.5 % (ref 11.5–15.5)
WBC: 19.2 10*3/uL — ABNORMAL HIGH (ref 4.0–10.5)
WBC: 21 10*3/uL — ABNORMAL HIGH (ref 4.0–10.5)
nRBC: 0.1 % (ref 0.0–0.2)
nRBC: 0.1 % (ref 0.0–0.2)

## 2018-11-27 LAB — CULTURE, BLOOD (ROUTINE X 2)
Culture: NO GROWTH
Culture: NO GROWTH
Special Requests: ADEQUATE
Special Requests: ADEQUATE

## 2018-11-27 LAB — BASIC METABOLIC PANEL
Anion gap: 10 (ref 5–15)
BUN: 11 mg/dL (ref 6–20)
CO2: 24 mmol/L (ref 22–32)
Calcium: 8.5 mg/dL — ABNORMAL LOW (ref 8.9–10.3)
Chloride: 94 mmol/L — ABNORMAL LOW (ref 98–111)
Creatinine, Ser: 0.7 mg/dL (ref 0.61–1.24)
GFR calc Af Amer: >60 mL/min (ref >60)
GFR calc non Af Amer: >60 mL/min (ref >60)
Glucose, Bld: 239 mg/dL — ABNORMAL HIGH (ref 70–99)
Potassium: 3.7 mmol/L (ref 3.5–5.1)
Sodium: 128 mmol/L — ABNORMAL LOW (ref 135–145)

## 2018-11-27 LAB — GLUCOSE, CAPILLARY
Glucose-Capillary: 246 mg/dL — ABNORMAL HIGH (ref 70–99)
Glucose-Capillary: 227 mg/dL — ABNORMAL HIGH (ref 70–99)
Glucose-Capillary: 230 mg/dL — ABNORMAL HIGH (ref 70–99)
Glucose-Capillary: 227 mg/dL — ABNORMAL HIGH (ref 70–99)

## 2018-11-27 LAB — MAGNESIUM: Magnesium: 1.7 mg/dL (ref 1.7–2.4)

## 2018-11-27 LAB — TROPONIN I (HIGH SENSITIVITY): Troponin I (High Sensitivity): 8 ng/L (ref <18)

## 2018-11-27 MED ORDER — CARVEDILOL 25 MG PO TABS
25.0000 mg | ORAL_TABLET | Freq: Two times a day (BID) | ORAL | Status: DC
  Administered 2018-11-27 – 2018-12-01 (×8): 25 mg via ORAL
  Filled 2018-11-27 (×9): qty 1

## 2018-11-27 MED ORDER — BISACODYL 5 MG PO TBEC
10.0000 mg | DELAYED_RELEASE_TABLET | Freq: Once | ORAL | Status: AC
  Administered 2018-11-27: 10 mg via ORAL
  Filled 2018-11-27: qty 2

## 2018-11-27 MED ORDER — OXYCODONE HCL 5 MG PO TABS
10.0000 mg | ORAL_TABLET | Freq: Four times a day (QID) | ORAL | Status: DC | PRN
  Administered 2018-11-27 – 2018-12-01 (×16): 10 mg via ORAL
  Filled 2018-11-27 (×16): qty 2

## 2018-11-27 MED ORDER — CLONAZEPAM 0.5 MG PO TABS
0.5000 mg | ORAL_TABLET | Freq: Once | ORAL | Status: AC
  Administered 2018-11-28: 0.5 mg via ORAL
  Filled 2018-11-27: qty 1

## 2018-11-27 MED ORDER — LACTULOSE 10 GM/15ML PO SOLN
20.0000 g | Freq: Every day | ORAL | Status: DC | PRN
  Administered 2018-11-28: 20 g via ORAL
  Filled 2018-11-27: qty 30

## 2018-11-27 MED ORDER — LABETALOL HCL 5 MG/ML IV SOLN
20.0000 mg | Freq: Once | INTRAVENOUS | Status: AC
  Administered 2018-11-27: 20 mg via INTRAVENOUS
  Filled 2018-11-27: qty 4

## 2018-11-27 MED ORDER — MORPHINE SULFATE (PF) 4 MG/ML IV SOLN
4.0000 mg | Freq: Once | INTRAVENOUS | Status: AC
  Administered 2018-11-27: 4 mg via INTRAVENOUS
  Filled 2018-11-27: qty 1

## 2018-11-27 MED ORDER — DICYCLOMINE HCL 20 MG PO TABS
20.0000 mg | ORAL_TABLET | Freq: Two times a day (BID) | ORAL | Status: DC
  Administered 2018-11-27: 20 mg via ORAL
  Filled 2018-11-27 (×3): qty 1

## 2018-11-27 MED ORDER — AMLODIPINE BESYLATE 5 MG PO TABS
5.0000 mg | ORAL_TABLET | Freq: Every day | ORAL | Status: DC
  Administered 2018-11-27 – 2018-12-01 (×5): 5 mg via ORAL
  Filled 2018-11-27 (×5): qty 1

## 2018-11-27 MED ORDER — ALUM & MAG HYDROXIDE-SIMETH 200-200-20 MG/5ML PO SUSP
30.0000 mL | Freq: Four times a day (QID) | ORAL | Status: DC | PRN
  Administered 2018-11-27 – 2018-11-29 (×6): 30 mL via ORAL
  Filled 2018-11-27 (×6): qty 30

## 2018-11-27 MED ORDER — LABETALOL HCL 5 MG/ML IV SOLN
5.0000 mg | Freq: Once | INTRAVENOUS | Status: AC
  Administered 2018-11-27: 5 mg via INTRAVENOUS
  Filled 2018-11-27: qty 4

## 2018-11-27 MED ORDER — SIMETHICONE 80 MG PO CHEW
80.0000 mg | CHEWABLE_TABLET | Freq: Four times a day (QID) | ORAL | Status: DC | PRN
  Administered 2018-11-27: 80 mg via ORAL
  Filled 2018-11-27: qty 1

## 2018-11-27 MED ORDER — LABETALOL HCL 5 MG/ML IV SOLN
10.0000 mg | Freq: Once | INTRAVENOUS | Status: AC
  Administered 2018-11-27: 10 mg via INTRAVENOUS
  Filled 2018-11-27: qty 4

## 2018-11-27 NOTE — Progress Notes (Signed)
Late entry-Approximately 2330 last night,pt complaining of chest pain which he said started yesterday afternoon. He pointed to his lower sternum and to the right and left. He described the pain as sharp and said he felt pressure. . BP elevated . Other VS WNL. Notified Arby Barrette NP. EKG obtained,Labetolol given and Troponin ordered. This am pt is complaining of abdominal pain;no chest pain. He received another dose of Labetolol. Arby Barrette NP aware of results of EKG,Troponin, blood pressure,and pt's new complaint of abdominal pain. Pt medicated with Oxycodone and Dilaudid per order.

## 2018-11-27 NOTE — Progress Notes (Signed)
Pt's BP is 192/91, all other VS stable.  MD notified.  Will continue to monitor.  Eliezer Bottom [place]

## 2018-11-27 NOTE — Progress Notes (Signed)
Notified Arby Barrette NP of pt's latest BP,Troponin results and EKG results. OK to give second unit of blood after vancomycin infuses. Additional dose of labetolol ordered. Pt denies chest pain now.

## 2018-11-27 NOTE — Progress Notes (Signed)
Pt has been complaining of severe abdominal pain, stating that "it has never been this bad before". Meds given and MD notified.  Will continue to monitor.  Eliezer Bottom [place]

## 2018-11-27 NOTE — Progress Notes (Addendum)
PROGRESS NOTE    [name]   [id number]  DOB: June 21, 1965  DOA: 11/16/2018 PCP: Lucia Gaskins, MD   Brief Narrative:  Bertran Girard is a 53 y/o male with DM2, CVA. 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 He drove to [place] last weekend to get his son and on the drive back he noted the swelling in his knee which extended down to his foot along with redness. He had pain as well. He did not not fevers or chills. He does not recall any injury to the knee. He made an appt with Dr Sharol Given on 10/28 for later in the week but then decided to come to the ED. He was last admitted 10/12-10/13 when he was brought in unresponsive due to overdose of Oxycodone.  In the ED, the knee was aspirated and he was stared on IV antibiotics for possible septic arthritis. Knee aspirated again on 10/30 in OR. Found to be MRSA +. On Vancomycin.  He is status post removal of hardware and placement of antibiotic spacer by Ortho.  Ambulating with physical therapy.  He will need 6 weeks of IV vancomycin. Ortho is okay for discharge.  Social worker is setting up home health services. IPatient was planned for discharge today 11/8, but late reported CBC on 11/7, showed hemoglobin of 6.6.  He is status post transfusion of 2 units of packed red blood cell overnight.  His blood pressure has been elevated, coupled with hyponatremia, leukocytosis and a feeling of unwellness, and also with gaseous distention of abdomen. I ordered plain abdominal x-ray and started on antihypertensive medication.  Leukocytosis has been ongoing without fever but with a bump status post blood transfusion. Monitor for any fever and obtain blood culture with initiation of antibiotics.  I will keep patient today for further evaluation tomorrow and if stable could be discharged home.    Subjective: He is laying in bed  quietly and not in acute distress.  Is alert and oriented x3.  Stated that he is not aware about his discharge today.  I emphasized to him about discharge tomorrow and he verbalized understanding.  Arrangements in place for home health services on discharge.  Assessment & Plan:  Principal Problem:   Septic right knee joint -  MRSA bacteremia SIRS with tachycardia and leukocytosis MRSA bacteremia. Possible innoculation from foot ulcer.  Fluid aspirated- cell count 16,050, 93% neutrophils no organisms on gram stain and no crystals - 10/30 > s/p removal of 100 cc of pus like material - also has grown  MRSA He is status post hardware removal and placement of antibiotic spacer right knee.  PICC line was placed and patient will need 6 weeks of IV antibiotics.  He will also need home wound care prior to discharge.   Active Problems: Insomnia Continue with Ambien      Diabetes mellitus type 2 in nonobese  His hemoglobin A1c is 6.4 Continue with sliding scale insulin Fingersticks before meals and at bedtime Hypoglycemic protocol.     Hypokalemia Monitor and replete    Anemia, microcytic Continue with iron supplementation and vitamin B12.   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   Left arm numbness and tingling - MRI brain negative Follow-up with outpatient  EMG  Polysubstance abuse/prescription drug overdose. Judicious use of opiates for pain control   Abdominal pain: Patient complaining of significant abdominal pain.  He has been on consistent opiate and accident for more. Plain abdominal x-ray showed large amount of stool within the cecum and ascending colon compatible with constipation. Give Dulcolax 10 mg p.o. Continue with bowel regimen  DVT prophylaxis: Lovenox Code Status: Full code Family  Communication: Discussed with patient Disposition Plan: Possible discharge home tomorrow  Consultants:   ortho Procedures: None  Antimicrobials: Vancomycin Anti-infectives (From admission, onward)   Start     Dose/Rate Route Frequency Ordered Stop   11/25/18 0400  vancomycin (VANCOCIN) 1,250 mg in sodium chloride 0.9 % 250 mL IVPB     1,250 mg 166.7 mL/hr over 90 Minutes Intravenous Every 12 hours 11/24/18 1556     11/25/18 0000  vancomycin IVPB     1,250 mg Intravenous Every 12 hours 11/25/18 1558 01/04/19 2359   11/23/18 1137  vancomycin (VANCOCIN) powder  Status:  Discontinued       As needed 11/23/18 1137 11/23/18 1230   11/23/18 1000  ceFAZolin (ANCEF) IVPB 2g/100 mL premix     2 g 200 mL/hr over 30 Minutes Intravenous On call to O.R. 11/23/18 0707 11/23/18 1102   11/21/18 1600  vancomycin (VANCOCIN) 1,500 mg in sodium chloride 0.9 % 500 mL IVPB  Status:  Discontinued     1,500 mg 250 mL/hr over 120 Minutes Intravenous Every 12 hours 11/21/18 1012 11/24/18 1556   11/20/18 0400  vancomycin (VANCOCIN) 1,250 mg in sodium chloride 0.9 % 250 mL IVPB  Status:  Discontinued     1,250 mg 166.7 mL/hr over 90 Minutes Intravenous Every 12 hours 11/19/18 1509 11/21/18 1012   11/19/18 1600  vancomycin (VANCOCIN) 2,000 mg in sodium chloride 0.9 % 500 mL IVPB     2,000 mg 250 mL/hr over 120 Minutes Intravenous  Once 11/19/18 1509 11/19/18 1816   11/18/18 0600  ceFAZolin (ANCEF) IVPB 2g/100 mL premix  Status:  Discontinued     2 g 200 mL/hr over 30 Minutes Intravenous On call to O.R. 11/17/18 2043 11/18/18 1052   11/17/18 1400  ceFAZolin (ANCEF) IVPB 2g/100 mL premix  Status:  Discontinued     2 g 200 mL/hr over 30 Minutes Intravenous Every 8 hours 11/17/18 1008 11/20/18 1307   11/17/18 1200  vancomycin (VANCOCIN) 1,250 mg in sodium chloride 0.9 % 250 mL IVPB  Status:  Discontinued     1,250 mg 166.7 mL/hr over 90 Minutes Intravenous Every 12 hours 11/17/18 0204 11/17/18 0730   11/17/18  0800  ceFEPIme (MAXIPIME) 2 g in sodium chloride 0.9 % 100 mL IVPB  Status:  Discontinued     2 g 200 mL/hr over 30 Minutes Intravenous Every 8 hours 11/17/18 0204 11/17/18 0730   11/17/18 0115  vancomycin (VANCOCIN) IVPB 1000 mg/200 mL premix     1,000 mg 200 mL/hr over 60 Minutes Intravenous  Once 11/17/18 0000 11/17/18 0254   11/17/18 0015  vancomycin (VANCOCIN) IVPB 1000 mg/200 mL premix     1,000 mg 200 mL/hr over 60 Minutes Intravenous  Once 11/17/18 0000 11/17/18 0153   11/16/18 2330  vancomycin (VANCOCIN) 2,000 mg in sodium chloride 0.9 % 500 mL IVPB  Status:  Discontinued     2,000 mg 250 mL/hr over 120 Minutes Intravenous  Once 11/16/18 2319 11/17/18 0000   11/16/18 2330  ceFEPIme (MAXIPIME) 2 g in sodium chloride 0.9 % 100 mL IVPB  2 g 200 mL/hr over 30 Minutes Intravenous  Once 11/16/18 2319 11/17/18 0045       Objective: Vitals:   11/27/18 0606 11/27/18 0640 11/27/18 0920 11/27/18 1219  BP: (!) 163/89 (!) 175/92 (!) 182/86 (!) 192/91  Pulse: 79 81 91 88  Resp: 18 20 20 18   Temp: 99 F (37.2 C) 98.4 F (36.9 C) 98.2 F (36.8 C) 98.2 F (36.8 C)  TempSrc: Oral Oral Oral Oral  SpO2: 100% 100% 100% 100%  Weight:      Height:        Intake/Output Summary (Last 24 hours) at 11/27/2018 1627 Last data filed at 11/27/2018 1257 Gross per 24 hour  Intake 722 ml  Output 2475 ml  Net -1753 ml   Filed Weights   11/20/18 0500 11/21/18 0500 11/23/18 0923  Weight: 84.3 kg 79.7 kg 79.7 kg    Examination: General exam: Appears comfortable  HEENT: PERRLA, oral mucosa moist, no sclera icterus or thrush Respiratory system: Clear to auscultation. Respiratory effort normal. Cardiovascular system: S1 & S2 heard,  No murmurs  Gastrointestinal system: Abdomen soft, non-tender, nondistended. Normal bowel sounds   Central nervous system: Alert and oriented. No focal neurological deficits. Extremities: No cyanosis, clubbing - right knee in dressing with drain- right foot with  transmetatarsal amputation is swollen Psychiatry:  Mood & affect appropriate.  Skin:    Left foot     Data Reviewed: I have personally reviewed following labs and imaging studies  CBC: Recent Labs  Lab 11/24/18 0336 11/25/18 0400 11/26/18 1717 11/27/18 0212 11/27/18 1127  WBC 18.7* 4.7 17.7* 19.2* 21.0*  HGB 7.3* 9.7* 6.6* 7.6* 9.6*  HCT 22.7* 30.9* 20.2* 23.4* 28.8*  MCV 79.9* 84.9 79.5* 82.7 81.1  PLT 424* 160 575* 557* 0000000*   Basic Metabolic Panel: Recent Labs  Lab 11/21/18 0348 11/23/18 0537 11/24/18 0336 11/25/18 0400 11/26/18 0411 11/27/18 0212  NA 132*  --  133* 142 133* 128*  K 3.8  --  4.1 3.7 4.2 3.7  CL 98  --  98 106 99 94*  CO2 23  --  24 28 26 24   GLUCOSE 148*  --  227* 105* 157* 239*  BUN 15  --  14 6 12 11   CREATININE 0.84 0.78 0.81 0.88 0.97 0.70  CALCIUM 8.6*  --  8.6* 8.4* 8.5* 8.5*  MG  --   --  2.1 1.8 2.0 1.7  PHOS  --   --   --   --  4.1  --    GFR: Estimated Creatinine Clearance: 117.6 mL/min (by C-G formula based on SCr of 0.7 mg/dL). Liver Function Tests: Recent Labs  Lab 11/26/18 0411  AST 30  ALT 33  ALKPHOS 160*  BILITOT 0.5  PROT 6.2*  ALBUMIN 2.1*   No results for input(s): LIPASE, AMYLASE in the last 168 hours. No results for input(s): AMMONIA in the last 168 hours. Coagulation Profile: No results for input(s): INR, PROTIME in the last 168 hours. Cardiac Enzymes: No results for input(s): CKTOTAL, CKMB, CKMBINDEX, TROPONINI in the last 168 hours. BNP (last 3 results) No results for input(s): PROBNP in the last 8760 hours. HbA1C: No results for input(s): HGBA1C in the last 72 hours. CBG: Recent Labs  Lab 11/26/18 1133 11/26/18 1620 11/26/18 2124 11/27/18 0818 11/27/18 1218  GLUCAP 253* 242* 272* 246* 227*   Lipid Profile: No results for input(s): CHOL, HDL, LDLCALC, TRIG, CHOLHDL, LDLDIRECT in the last 72 hours. Thyroid Function Tests: No results  for input(s): TSH, T4TOTAL, FREET4, T3FREE, THYROIDAB in the  last 72 hours. Anemia Panel: No results for input(s): VITAMINB12, FOLATE, FERRITIN, TIBC, IRON, RETICCTPCT in the last 72 hours. Urine analysis:    Component Value Date/Time   COLORURINE STRAW (A) 10/31/2018 1928   APPEARANCEUR CLEAR 10/31/2018 1928   LABSPEC 1.006 10/31/2018 1928   PHURINE 5.0 10/31/2018 1928   GLUCOSEU 50 (A) 10/31/2018 1928   HGBUR SMALL (A) 10/31/2018 1928   BILIRUBINUR NEGATIVE 10/31/2018 1928   KETONESUR 20 (A) 10/31/2018 1928   PROTEINUR NEGATIVE 10/31/2018 1928   UROBILINOGEN 0.2 12/18/2013 1402   NITRITE NEGATIVE 10/31/2018 1928   LEUKOCYTESUR NEGATIVE 10/31/2018 1928   Sepsis Labs: @LABRCNTIP (procalcitonin:4,lacticidven:4) ) Recent Results (from the past 240 hour(s))  Aerobic/Anaerobic Culture (surgical/deep wound)     Status: None   Collection Time: 11/18/18 10:06 AM   Specimen: PATH Soft tissue  Result Value Ref Range Status   Specimen Description TISSUE  Final   Special Requests NONE  Final   Gram Stain   Final    ABUNDANT WBC PRESENT, PREDOMINANTLY PMN RARE GRAM POSITIVE COCCI    Culture   Final    FEW METHICILLIN RESISTANT STAPHYLOCOCCUS AUREUS NO ANAEROBES ISOLATED Performed at [facility], [place] [address], [place], [place] [address]    Report Status 11/23/2018 FINAL  Final   Organism ID, Bacteria METHICILLIN RESISTANT STAPHYLOCOCCUS AUREUS  Final      Susceptibility   Methicillin resistant staphylococcus aureus - MIC*    CIPROFLOXACIN >=8 RESISTANT Resistant     ERYTHROMYCIN >=8 RESISTANT Resistant     GENTAMICIN <=0.5 SENSITIVE Sensitive     OXACILLIN >=4 RESISTANT Resistant     TETRACYCLINE >=16 RESISTANT Resistant     VANCOMYCIN 1 SENSITIVE Sensitive     TRIMETH/SULFA >=320 RESISTANT Resistant     CLINDAMYCIN RESISTANT Resistant     RIFAMPIN <=0.5 SENSITIVE Sensitive     Inducible Clindamycin POSITIVE Resistant     * FEW METHICILLIN RESISTANT STAPHYLOCOCCUS AUREUS  Acid Fast Smear (AFB)     Status: None   Collection  Time: 11/18/18 10:06 AM   Specimen: PATH Soft tissue  Result Value Ref Range Status   AFB Specimen Processing Concentration  Final   Acid Fast Smear Negative  Final    Comment: (NOTE) Performed At: [facility] [address] [place], [place] [id number] Rush Farmer MD [id number]    Source (AFB) TISSUE  Final    Comment: Performed at [facility], [place] [address] [place], [place] [address]  Culture, blood (routine x 2)     Status: None   Collection Time: 11/22/18  4:34 PM   Specimen: BLOOD  Result Value Ref Range Status   Specimen Description BLOOD LEFT ANTECUBITAL  Final   Special Requests   Final    BOTTLES DRAWN AEROBIC AND ANAEROBIC Blood Culture adequate volume   Culture   Final    NO GROWTH 5 DAYS Performed at [facility], [place] [address] [place], [place] [address]    Report Status 11/27/2018 FINAL  Final  Culture, blood (routine x 2)     Status: None   Collection Time: 11/22/18  4:35 PM   Specimen: BLOOD LEFT HAND  Result Value Ref Range Status   Specimen Description BLOOD LEFT HAND  Final   Special Requests   Final    BOTTLES DRAWN AEROBIC AND ANAEROBIC Blood Culture adequate volume   Culture   Final    NO GROWTH 5  DAYS Performed at [facility], [place] [address], [place], [place] [address]    Report Status 11/27/2018 FINAL  Final  Aerobic/Anaerobic Culture (surgical/deep wound)     Status: None (Preliminary result)   Collection Time: 11/23/18 11:18 AM   Specimen: PATH Cytology Misc. fluid; Body Fluid  Result Value Ref Range Status   Specimen Description TISSUE SYNOVIAL LEFT KNEE  Final   Special Requests NONE  Final   Gram Stain   Final    FEW WBC PRESENT, PREDOMINANTLY PMN NO ORGANISMS SEEN    Culture   Final    RARE STAPHYLOCOCCUS AUREUS CRITICAL RESULT CALLED TO, READ BACK BY AND VERIFIED WITH: N. THOMPSON RN, AT 1053 11/25/18 BY D. VANHOOK REGARDING CULTURE GROWTH NO ANAEROBES ISOLATED; CULTURE IN PROGRESS FOR 5 DAYS     Report Status PENDING  Incomplete   Organism ID, Bacteria STAPHYLOCOCCUS AUREUS  Final      Susceptibility   Staphylococcus aureus - MIC*    CIPROFLOXACIN >=8 RESISTANT Resistant     ERYTHROMYCIN >=8 RESISTANT Resistant     GENTAMICIN <=0.5 SENSITIVE Sensitive     OXACILLIN >=4 RESISTANT Resistant     TETRACYCLINE >=16 RESISTANT Resistant     VANCOMYCIN <=0.5 SENSITIVE Sensitive     TRIMETH/SULFA >=320 RESISTANT Resistant     CLINDAMYCIN RESISTANT Resistant     RIFAMPIN <=0.5 SENSITIVE Sensitive     Inducible Clindamycin Value in next row Resistant      POSITIVEPerformed at [place] [address], [place], [place] [address]    * RARE STAPHYLOCOCCUS AUREUS         Radiology Studies: No results found.    Scheduled Meds:  sodium chloride   Intravenous Once   amLODipine  5 mg Oral Daily   carvedilol  25 mg Oral BID WC   Chlorhexidine Gluconate Cloth  6 each Topical Daily   dicyclomine  20 mg Oral BID   docusate sodium  100 mg Oral BID   enoxaparin (LOVENOX) injection  40 mg Subcutaneous Q24H   ferrous sulfate  325 mg Oral BID WC   insulin aspart  0-9 Units Subcutaneous TID WC   nicotine  21 mg Transdermal Daily   pentoxifylline  400 mg Oral TID WC   polyethylene glycol  17 g Oral BID   rosuvastatin  20 mg Oral Daily   senna-docusate  2 tablet Oral QHS   sodium chloride flush  10-40 mL Intracatheter Q12H   vitamin B-12  1,000 mcg Oral Daily   Continuous Infusions:  sodium chloride Stopped (11/23/18 1702)   lactated ringers Stopped (11/23/18 1231)   vancomycin 1,250 mg (11/27/18 1549)     LOS: 11 days   Total time spent on this encounter is 35 minutes.  Elie Confer, MD [facility] Pager: [phone] [email or web address] Password [facility] 11/27/2018, 4:27 PM

## 2018-11-28 ENCOUNTER — Encounter (HOSPITAL_COMMUNITY): Payer: Self-pay | Admitting: Orthopedic Surgery

## 2018-11-28 LAB — CBC
HCT: 28.8 % — ABNORMAL LOW (ref 39.0–52.0)
Hemoglobin: 9.7 g/dL — ABNORMAL LOW (ref 13.0–17.0)
MCH: 27.1 pg (ref 26.0–34.0)
MCHC: 33.7 g/dL (ref 30.0–36.0)
MCV: 80.4 fL (ref 80.0–100.0)
Platelets: 498 10*3/uL — ABNORMAL HIGH (ref 150–400)
RBC: 3.58 MIL/uL — ABNORMAL LOW (ref 4.22–5.81)
RDW: 14.6 % (ref 11.5–15.5)
WBC: 20.3 10*3/uL — ABNORMAL HIGH (ref 4.0–10.5)
nRBC: 0 % (ref 0.0–0.2)

## 2018-11-28 LAB — BPAM RBC
Blood Product Expiration Date: 202011142359
Blood Product Expiration Date: 202011162359
ISSUE DATE / TIME: 202011072041
ISSUE DATE / TIME: 202011080612
Unit Type and Rh: 9500
Unit Type and Rh: 9500

## 2018-11-28 LAB — BASIC METABOLIC PANEL
Anion gap: 12 (ref 5–15)
BUN: 8 mg/dL (ref 6–20)
CO2: 22 mmol/L (ref 22–32)
Calcium: 8.4 mg/dL — ABNORMAL LOW (ref 8.9–10.3)
Chloride: 82 mmol/L — ABNORMAL LOW (ref 98–111)
Creatinine, Ser: 0.63 mg/dL (ref 0.61–1.24)
GFR calc Af Amer: >60 mL/min (ref >60)
GFR calc non Af Amer: >60 mL/min (ref >60)
Glucose, Bld: 202 mg/dL — ABNORMAL HIGH (ref 70–99)
Potassium: 3.6 mmol/L (ref 3.5–5.1)
Sodium: 116 mmol/L — CL (ref 135–145)

## 2018-11-28 LAB — VANCOMYCIN, TROUGH: Vancomycin Tr: 14 ug/mL — ABNORMAL LOW (ref 15–20)

## 2018-11-28 LAB — LIPID PANEL
Cholesterol: 102 mg/dL (ref 0–200)
HDL: 37 mg/dL — ABNORMAL LOW
LDL Cholesterol: 42 mg/dL (ref 0–99)
Total CHOL/HDL Ratio: 2.8 ratio
Triglycerides: 113 mg/dL
VLDL: 23 mg/dL (ref 0–40)

## 2018-11-28 LAB — TSH: TSH: 1.34 u[IU]/mL (ref 0.350–4.500)

## 2018-11-28 LAB — GLUCOSE, CAPILLARY
Glucose-Capillary: 208 mg/dL — ABNORMAL HIGH (ref 70–99)
Glucose-Capillary: 195 mg/dL — ABNORMAL HIGH (ref 70–99)
Glucose-Capillary: 155 mg/dL — ABNORMAL HIGH (ref 70–99)
Glucose-Capillary: 197 mg/dL — ABNORMAL HIGH (ref 70–99)

## 2018-11-28 LAB — SODIUM: Sodium: 116 mmol/L — CL (ref 135–145)

## 2018-11-28 LAB — TYPE AND SCREEN
ABO/RH(D): O NEG
Antibody Screen: NEGATIVE
Unit division: 0
Unit division: 0

## 2018-11-28 LAB — AEROBIC/ANAEROBIC CULTURE W GRAM STAIN (SURGICAL/DEEP WOUND)

## 2018-11-28 LAB — VANCOMYCIN, PEAK
Vancomycin Pk: 14 ug/mL — ABNORMAL LOW (ref 30–40)
Vancomycin Pk: 30 ug/mL (ref 30–40)

## 2018-11-28 LAB — PHOSPHORUS: Phosphorus: 2.3 mg/dL — ABNORMAL LOW (ref 2.5–4.6)

## 2018-11-28 LAB — SODIUM, URINE, RANDOM: Sodium, Ur: 66 mmol/L

## 2018-11-28 LAB — OSMOLALITY, URINE: Osmolality, Ur: 405 mosm/kg (ref 300–900)

## 2018-11-28 LAB — MAGNESIUM: Magnesium: 1.8 mg/dL (ref 1.7–2.4)

## 2018-11-28 MED ORDER — VANCOMYCIN HCL IN DEXTROSE 1-5 GM/200ML-% IV SOLN
1000.0000 mg | Freq: Two times a day (BID) | INTRAVENOUS | Status: DC
  Administered 2018-11-29 – 2018-12-01 (×6): 1000 mg via INTRAVENOUS
  Filled 2018-11-28 (×9): qty 200

## 2018-11-28 MED ORDER — BISACODYL 10 MG RE SUPP
10.0000 mg | Freq: Once | RECTAL | Status: AC
  Administered 2018-11-28: 10 mg via RECTAL
  Filled 2018-11-28: qty 1

## 2018-11-28 MED ORDER — ZOLPIDEM TARTRATE 5 MG PO TABS
5.0000 mg | ORAL_TABLET | Freq: Once | ORAL | Status: AC
  Administered 2018-11-28: 5 mg via ORAL
  Filled 2018-11-28: qty 1

## 2018-11-28 MED ORDER — SODIUM PHOSPHATES 45 MMOLE/15ML IV SOLN
20.0000 mmol | Freq: Once | INTRAVENOUS | Status: AC
  Administered 2018-11-28: 20 mmol via INTRAVENOUS
  Filled 2018-11-28: qty 6.67

## 2018-11-28 MED ORDER — VANCOMYCIN HCL IN DEXTROSE 1-5 GM/200ML-% IV SOLN
1000.0000 mg | Freq: Two times a day (BID) | INTRAVENOUS | Status: DC
  Filled 2018-11-28: qty 200

## 2018-11-28 NOTE — Progress Notes (Addendum)
Pharmacy Antibiotic Note  [name] is a 53 y.o. male admitted on 11/16/2018 with  septic R knee, MRSA bacteremia.  Pharmacy has been consulted for Vancomycin dosing.  10/29 Vancomycin X 1,  10/31>>(12/15) 10/28 Cefepime >> 10/29 10/29 Cefazolin >> 11/1  10/13 MRSA PCR positive 10/29 MRSA PCR neg 10/28 BCx: MRSA 10/28, 11/4 Synovial fluid: MRSA 10/30 AFB neg, Cx sent 10/30 wound cx MRSA 11/3 BCx X 2: NG/final  Steady-state vancomycin levels drawn around most recent dose of vancomycin 1250 mg IV Q 12 hr regimen (drawn at 16:16 PM today) were as follows: Vancomycin trough (drawn at 15:54 PM, just prior to dose): 14 mg/L Vancomycin peak (drawn at 20:11 PM): 30 mg/L Vancomycin AUC calculated using these vancomycin levels is 576.1, which is above the goal vancomycin AUC range of 400-550 (Note: vancomycin level drawn at 15:54 PM was erroneously labeled in Epic as vancomycin peak level; lab has entered result for vancomycin trough at 15:54 PM, but cannot remove other level at same time)  WBC 20.3, afebrile, Scr 0.63, CrCl 117.6 ml/min (renal function stable); Na 116 (pt rec'd sodium phosphate infusion today)  Plan: Decrease vancomycin regimen to 1 gm IV Q 12 hrs (estimated vancomycin AUC on this regimen, using vancomycin levels above, is 460.6; goal vancomycin AUC is 400-550) Monitor renal function, WBC, temp, clinical improvement, cultures Update OPAT orders  Height: 6\' 3"  (190.5 cm) Weight: 175 lb 11.3 oz (79.7 kg) IBW/kg (Calculated) : 84.5  Temp (24hrs), Avg:98.2 F (36.8 C), Min:97.9 F (36.6 C), Max:98.5 F (36.9 C)  Recent Labs  Lab 11/24/18 0336  11/25/18 0400 11/26/18 0411 11/26/18 1717 11/27/18 0212 11/27/18 1127 11/28/18 0338 11/28/18 1554 11/28/18 2011  WBC 18.7*  --  4.7  --  17.7* 19.2* 21.0* 20.3*  --   --   CREATININE 0.81  --  0.88 0.97  --  0.70  --  0.63  --   --   VANCOTROUGH  --    < >  --   --  18  --   --   --  14*  --   VANCOPEAK  --   --   --    --   --   --   --   --  14* 30   < > = values in this interval not displayed.    Estimated Creatinine Clearance: 117.6 mL/min (by C-G formula based on SCr of 0.63 mg/dL).    Allergies  Allergen Reactions  . Benadryl [Diphenhydramine Hcl] Other (See Comments)    Leg spasms   . Trazodone And Nefazodone Other (See Comments)    Leg spasms     Gillermina Hu, PharmD, BCPS, [facility] Clinical Pharmacist 11/28/2018 9:50 PM

## 2018-11-28 NOTE — Progress Notes (Addendum)
Pharmacy Antibiotic Note  [name] is a 53 y.o. male admitted on 11/16/2018 with  septic R knee, MRSA bacteremia.  Pharmacy has been consulted for Vancomycin dosing.  ID: septic R knee, MRSA bacteremia. Afebrile. WBC 20.3. Scr 0.63 +PICC (10/31 negative for cocaine) still surprised at home discharge. - Has some small healing foot ulcers - 11/4 s/p removal of hardware and placement of antibiotic spacer by Ortho.   10/29 Vancomycin x1,  10/31>>(12/15) 10/28 Cefepime >> 10/29 10/29 Cefazolin >> 11/1  10/13 MRSA PCR positive 10/29 MRSA PCR neg 10/28 BCx: MRSA 10/28 Synovial fluid: MRSA 10/30 AFB neg, Cx sent 10/30 wound cx MRSA  Vancomycin 2000mg  IV x 1 then vanc 1250mg  IV q12h (estAUC 482 using SCr 1)  Vancomycin 1500 mg IV Q 12 hrs. Goal AUC 400-550. Expected AUC: 507.7 SCr used: 0.84  11/5: Trough 22 (peak did not get drawn)   Plan: Vancomycin decreased to 1250 mg every 12 hours  OPAT updated 11/5 with VT at 22.  Attempt levels again today/tomorrow Recommend supplementation with NaPhos replacement today. F/u Na levels  Height: 6\' 3"  (190.5 cm) Weight: 175 lb 11.3 oz (79.7 kg) IBW/kg (Calculated) : 84.5  Temp (24hrs), Avg:98.3 F (36.8 C), Min:98.2 F (36.8 C), Max:98.5 F (36.9 C)  Recent Labs  Lab 11/24/18 0336 11/24/18 1452 11/25/18 0400 11/26/18 0411 11/26/18 1717 11/27/18 0212 11/27/18 1127 11/28/18 0338  WBC 18.7*  --  4.7  --  17.7* 19.2* 21.0* 20.3*  CREATININE 0.81  --  0.88 0.97  --  0.70  --  0.63  VANCOTROUGH  --  22*  --   --  18  --   --   --     Estimated Creatinine Clearance: 117.6 mL/min (by C-G formula based on SCr of 0.63 mg/dL).    Allergies  Allergen Reactions  . Benadryl [Diphenhydramine Hcl] Other (See Comments)    Leg spasms   . Trazodone And Nefazodone Other (See Comments)    Leg spasms      [name] S. [name], PharmD, BCPS Clinical Staff Pharmacist [name] 11/28/2018 8:30 AM

## 2018-11-28 NOTE — Progress Notes (Signed)
CRITICAL VALUE ALERT  Critical Value:  Na+116  Date & Time Notied:  11/28/2018  Provider Notified: Arby Barrette NP  Orders Received/Actions taken: no orders

## 2018-11-28 NOTE — Progress Notes (Signed)
Occupational Therapy Treatment Patient Details Name: [name] MRN: [id number] DOB: [birth year] Today's Date: 11/28/2018    History of present illness Pt is a 53 y/o male admitted secondary to R knee pain. Pt is s/p irrigation and debridement right total knee arthroplasty arthroscopically. Cultures were found to have gram-positive cocci in all. S/p removal total knee with placement of an anatomic antibiotic spacer on 11/4. PMH including but not limited to DM, ETOH abuse, R transmetatarsal amputation (05/2018)   OT comments  Pt making minimal progress towards OT goals this session. Pt c/o constipation at start of session. Pt only agreeable to x1 sit >stand from EOB to get repositioned in bed. Pt required MIN- MOD A for sit>stand from EOB with RW. Total A for LB ADL, pt declined further ADL participation. Will continue to follow acutely per POC. DC plan remains appropriate, contingent on increased progress with therapies.    Follow Up Recommendations  Home health OT;Supervision - Intermittent    Equipment Recommendations  3 in 1 bedside commode;Other (comment)    Recommendations for Other Services      Precautions / Restrictions Precautions Precautions: Fall Required Braces or Orthoses: Other Brace Other Brace: wrist splint to L (pt not using due to IV placement) Restrictions Weight Bearing Restrictions: No RLE Weight Bearing: Weight bearing as tolerated Other Position/Activity Restrictions: minimize ROM of knee per MD order       Mobility Bed Mobility Overal bed mobility: Needs Assistance Bed Mobility: Supine to Sit;Sit to Supine     Supine to sit: Min assist Sit to supine: Min assist   General bed mobility comments: pt requires MIN A for bed mobility to maneuver RLE in/ out of bed  Transfers Overall transfer level: Needs assistance Equipment used: Rolling walker (2 wheeled) Transfers: Sit to/from Stand Sit to Stand: Min assist;Mod assist;From elevated surface          General transfer comment: MIN - MOD A for sit>stand from EOB with RW and elevated surface, increased assist to boost into standing, pt not wanting to put full weight on RLE    Balance Overall balance assessment: Needs assistance Sitting-balance support: No upper extremity supported;Feet supported Sitting balance-Leahy Scale: Good Sitting balance - Comments: sat EOB with close min guard for safety   Standing balance support: Bilateral upper extremity supported Standing balance-Leahy Scale: Poor Standing balance comment: unable to maintain standing balance without B UE support                            ADL either performed or assessed with clinical judgement   ADL Overall ADL's : Needs assistance/impaired       Grooming Details (indicate cue type and reason): decreased functional use of L UE              Lower Body Dressing: Total assistance;Bed level Lower Body Dressing Details (indicate cue type and reason): declined attempting to put on socks; total A from supine in bed   Toilet Transfer Details (indicate cue type and reason): unable to complete transfer d/t 10/10 pain in standing         Functional mobility during ADLs: Minimal assistance;Rolling walker;Moderate assistance General ADL Comments: pt limited by constipation and self- limited behaviors only agreeable to sit>stand from EOB and wait for pain meds, education provided on importance of mobility and the risk for increased constipation with pain meds     Vision Baseline Vision/History: Wears glasses Patient Visual Report: No  change from baseline     Perception     Praxis      Cognition Arousal/Alertness: Awake/alert Behavior During Therapy: WFL for tasks assessed/performed Overall Cognitive Status: Within Functional Limits for tasks assessed                                 General Comments: self- limiting behaviors        Exercises     Shoulder Instructions        General Comments      Pertinent Vitals/ Pain       Pain Assessment: 0-10 Pain Score: 10-Worst pain ever Pain Location: stomach; constipated Pain Descriptors / Indicators: Cramping;Discomfort;Grimacing;Constant Pain Intervention(s): Monitored during session  Home Living                                          Prior Functioning/Environment              Frequency  Min 2X/week        Progress Toward Goals  OT Goals(current goals can now be found in the care plan section)  Progress towards OT goals: Progressing toward goals  Acute Rehab OT Goals Patient Stated Goal: to get his infection cleared up OT Goal Formulation: With patient Time For Goal Achievement: 12/04/18 Potential to Achieve Goals: Good  Plan Discharge plan remains appropriate    Co-evaluation                 AM-PAC OT "6 Clicks" Daily Activity     Outcome Measure   Help from another person eating meals?: None Help from another person taking care of personal grooming?: None Help from another person toileting, which includes using toliet, bedpan, or urinal?: A Lot Help from another person bathing (including washing, rinsing, drying)?: A Lot Help from another person to put on and taking off regular upper body clothing?: None Help from another person to put on and taking off regular lower body clothing?: A Lot 6 Click Score: 18    End of Session Equipment Utilized During Treatment: Gait belt;Rolling walker  OT Visit Diagnosis: Other abnormalities of gait and mobility (R26.89);Muscle weakness (generalized) (M62.81);Pain Pain - Right/Left: Right Pain - part of body: Knee   Activity Tolerance Patient limited by pain;Other (comment)(constipated)   Patient Left in bed;with call bell/phone within reach   Nurse Communication Mobility status        Time: [id number] OT Time Calculation (min): 13 min  Charges: OT General Charges $OT Visit: 1 Visit OT Treatments $Therapeutic  Activity: 8-22 mins  Lanier Clam., COTA/L Acute Rehabilitation Services [phone]    Ihor Gully 11/28/2018, 1:48 PM

## 2018-11-28 NOTE — Progress Notes (Signed)
CRITICAL VALUE ALERT  Critical Value:  Na 116   Date & Time Notied:  11/28/18, 1425  Provider Notified: Dr. Theresa Duty  Orders Received/Actions taken: Awaiting orders

## 2018-11-28 NOTE — Progress Notes (Signed)
PROGRESS NOTE    [name]   [id number]  DOB: August 14, 1965  DOA: 11/16/2018 PCP: Lucia Gaskins, MD   Brief Narrative:  [name] is a 53 y/o male with DM2, CVA. 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 He drove to [place] last weekend to get his son and on the drive back he noted the swelling in his knee which extended down to his foot along with redness. He had pain as well. He did not not fevers or chills. He does not recall any injury to the knee. He made an appt with Dr Sharol Given on 10/28 for later in the week but then decided to come to the ED. He was last admitted 10/12-10/13 when he was brought in unresponsive due to overdose of Oxycodone.  In the ED, the knee was aspirated and he was stared on IV antibiotics for possible septic arthritis. Knee aspirated again on 10/30 in OR. Found to be MRSA +. On Vancomycin.  He is status post removal of hardware and placement of antibiotic spacer by Ortho.  Ambulating with physical therapy.  He will need 6 weeks of IV vancomycin. Ortho is okay for discharge.  Social worker is setting up home health services. IPatient was planned for discharge on 11/8, but late reported CBC on 11/7, showed hemoglobin of 6.6.  He is status post transfusion of 2 units of packed red blood cell overnight.  His blood pressure has been elevated, coupled with hyponatremia, leukocytosis and a feeling of unwellness, and also with gaseous distention of abdomen. Leukocytosis has been ongoing without fever but with a bump status post blood transfusion. Monitor for any fever and obtain blood culture with initiation of antibiotics.     Subjective: Patient complains of significant constipation and states he has not had a bowel movement in 3 days.  He states he is able to pass gas however.  Arrangements in place for home health services on  discharge.  Assessment & Plan:  Principal Problem:   Septic right knee joint -  MRSA bacteremia SIRS with tachycardia and leukocytosis MRSA bacteremia. Possible innoculation from foot ulcer.  Fluid aspirated- cell count 16,050, 93% neutrophils no organisms on gram stain and no crystals - 10/30 > s/p removal of 100 cc of pus like material - also has grown  MRSA He is status post hardware removal and placement of antibiotic spacer right knee.  PICC line was placed and patient will need 6 weeks of IV antibiotics.  He will also need home wound care prior to discharge.  Hyponatremia -We will send off hyponatremia labs as a repeat sodium confirmed the previous value of 116 to be seemingly accurate -Avoid rapid correction of sodium: We will avoid correcting sodium by more than 8 to 10 mEq per 24-hour.  Active Problems: Insomnia Continue with Ambien      Diabetes mellitus type 2 in nonobese  His hemoglobin A1c is 6.4 Continue with sliding scale insulin Fingersticks before meals and at bedtime Hypoglycemic protocol.     Hypokalemia Monitor and replete    Anemia, microcytic Continue with iron supplementation and vitamin B12.   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   Left arm numbness and tingling - MRI brain negative Follow-up with outpatient  EMG  Polysubstance abuse/prescription drug overdose. Judicious use of opiates for pain control   Abdominal pain: Patient complaining of significant abdominal pain.  He has been on consistent opiate and was asking for more. Plain abdominal x-ray showed large amount of stool within the cecum and ascending colon compatible with constipation. Give Dulcolax 10 mg p.o. Continue with bowel regimen  DVT prophylaxis: Lovenox Code Status: Full code Family Communication:  Discussed with patient Disposition Plan: Possible discharge home tomorrow  Consultants:   ortho Procedures: None  Antimicrobials: Vancomycin Anti-infectives (From admission, onward)   Start     Dose/Rate Route Frequency Ordered Stop   11/25/18 0400  vancomycin (VANCOCIN) 1,250 mg in sodium chloride 0.9 % 250 mL IVPB     1,250 mg 166.7 mL/hr over 90 Minutes Intravenous Every 12 hours 11/24/18 1556     11/25/18 0000  vancomycin IVPB     1,250 mg Intravenous Every 12 hours 11/25/18 1558 01/04/19 2359   11/23/18 1137  vancomycin (VANCOCIN) powder  Status:  Discontinued       As needed 11/23/18 1137 11/23/18 1230   11/23/18 1000  ceFAZolin (ANCEF) IVPB 2g/100 mL premix     2 g 200 mL/hr over 30 Minutes Intravenous On call to O.R. 11/23/18 0707 11/23/18 1102   11/21/18 1600  vancomycin (VANCOCIN) 1,500 mg in sodium chloride 0.9 % 500 mL IVPB  Status:  Discontinued     1,500 mg 250 mL/hr over 120 Minutes Intravenous Every 12 hours 11/21/18 1012 11/24/18 1556   11/20/18 0400  vancomycin (VANCOCIN) 1,250 mg in sodium chloride 0.9 % 250 mL IVPB  Status:  Discontinued     1,250 mg 166.7 mL/hr over 90 Minutes Intravenous Every 12 hours 11/19/18 1509 11/21/18 1012   11/19/18 1600  vancomycin (VANCOCIN) 2,000 mg in sodium chloride 0.9 % 500 mL IVPB     2,000 mg 250 mL/hr over 120 Minutes Intravenous  Once 11/19/18 1509 11/19/18 1816   11/18/18 0600  ceFAZolin (ANCEF) IVPB 2g/100 mL premix  Status:  Discontinued     2 g 200 mL/hr over 30 Minutes Intravenous On call to O.R. 11/17/18 2043 11/18/18 1052   11/17/18 1400  ceFAZolin (ANCEF) IVPB 2g/100 mL premix  Status:  Discontinued     2 g 200 mL/hr over 30 Minutes Intravenous Every 8 hours 11/17/18 1008 11/20/18 1307   11/17/18 1200  vancomycin (VANCOCIN) 1,250 mg in sodium chloride 0.9 % 250 mL IVPB  Status:  Discontinued     1,250 mg 166.7 mL/hr over 90 Minutes Intravenous Every 12 hours 11/17/18 0204 11/17/18 0730   11/17/18 0800  ceFEPIme  (MAXIPIME) 2 g in sodium chloride 0.9 % 100 mL IVPB  Status:  Discontinued     2 g 200 mL/hr over 30 Minutes Intravenous Every 8 hours 11/17/18 0204 11/17/18 0730   11/17/18 0115  vancomycin (VANCOCIN) IVPB 1000 mg/200 mL premix     1,000 mg 200 mL/hr over 60 Minutes Intravenous  Once 11/17/18 0000 11/17/18 0254   11/17/18 0015  vancomycin (VANCOCIN) IVPB 1000 mg/200 mL premix     1,000 mg 200 mL/hr over 60 Minutes Intravenous  Once 11/17/18 0000 11/17/18 0153   11/16/18 2330  vancomycin (VANCOCIN) 2,000 mg in sodium chloride 0.9 % 500 mL IVPB  Status:  Discontinued     2,000 mg 250 mL/hr over 120 Minutes Intravenous  Once 11/16/18 2319 11/17/18 0000   11/16/18 2330  ceFEPIme (MAXIPIME) 2 g in sodium chloride 0.9 % 100 mL IVPB  2 g 200 mL/hr over 30 Minutes Intravenous  Once 11/16/18 2319 11/17/18 0045       Objective: Vitals:   11/27/18 1645 11/27/18 1935 11/28/18 0602 11/28/18 1203  BP: (!) 184/94 (!) 170/93 (!) 165/90 138/87  Pulse: 88 87 79 77  Resp:  20  18  Temp:  98.2 F (36.8 C) 98.5 F (36.9 C) 98.2 F (36.8 C)  TempSrc:  Oral Oral Oral  SpO2: 100% 100% 97% 97%  Weight:      Height:        Intake/Output Summary (Last 24 hours) at 11/28/2018 1520 Last data filed at 11/28/2018 1516 Gross per 24 hour  Intake 1155.8 ml  Output 3275 ml  Net -2119.2 ml   Filed Weights   11/20/18 0500 11/21/18 0500 11/23/18 0923  Weight: 84.3 kg 79.7 kg 79.7 kg    Examination: General exam: Appears comfortable  HEENT: PERRLA, oral mucosa moist, no sclera icterus or thrush Respiratory system: Clear to auscultation. Respiratory effort normal. Cardiovascular system: S1 & S2 heard,  No murmurs  Gastrointestinal system: Abdomen soft, non-tender, nondistended. Normal bowel sounds   Central nervous system: Alert and oriented. No focal neurological deficits. Extremities: No cyanosis, clubbing - right knee in dressing with drain- right foot with transmetatarsal amputation is swollen  Psychiatry:  Mood & affect appropriate.  Skin:    Left foot    LOS: 12 days   Total time spent on this encounter is 35 minutes.  Charolotte Capuchin, MD [facility] [email or web address] Password [email or web address] 11/28/2018, 3:20 PM

## 2018-11-28 NOTE — Progress Notes (Signed)
Physical Therapy Treatment Patient Details Name: [name] MRN: [id number] DOB: 01/15/1966 Today's Date: 11/28/2018    History of Present Illness Pt is a 53 y/o male admitted secondary to R knee pain. Pt is s/p irrigation and debridement right total knee arthroplasty arthroscopically. Cultures were found to have gram-positive cocci in all. S/p removal total knee with placement of an anatomic antibiotic spacer on 11/4. PMH including but not limited to DM, ETOH abuse, R transmetatarsal amputation (05/2018)    PT Comments    Pt complaining of significant abdominal pain due to constipation, but pt agreeable to ambulation. Pt ambulated short room distance with min guard assist and min verbal cuing for pt safety with RW. Pt limited by constipation and R knee pain. PT to continue to follow acutely, pt to possibly d/c tomorrow.     Follow Up Recommendations  Home health PT;Supervision/Assistance - 24 hour     Equipment Recommendations  None recommended by PT    Recommendations for Other Services       Precautions / Restrictions Precautions Precautions: Fall Required Braces or Orthoses: Other Brace Other Brace: wrist splint to L (pt not using due to IV placement, use of RW) Restrictions Weight Bearing Restrictions: No RLE Weight Bearing: Weight bearing as tolerated Other Position/Activity Restrictions: minimize ROM of knee per MD order    Mobility  Bed Mobility Overal bed mobility: Needs Assistance Bed Mobility: Supine to Sit;Sit to Supine     Supine to sit: Min assist;HOB elevated Sit to supine: Min assist;HOB elevated   General bed mobility comments: min assist for RLE lifting, increased time with use of bedrails and HOB elevation.  Transfers Overall transfer level: Needs assistance Equipment used: Rolling walker (2 wheeled) Transfers: Sit to/from Stand Sit to Stand: Min assist;From elevated surface         General transfer comment: min assist for power up, steadying.  Pt initially unsteady upon standing with RW going too far out in front of him when standing, requesting PT bring it closer.  Ambulation/Gait Ambulation/Gait assistance: Min guard Gait Distance (Feet): 25 Feet Assistive device: Rolling walker (2 wheeled) Gait Pattern/deviations: Step-to pattern;Decreased weight shift to right;Decreased stance time - right;Antalgic;Trunk flexed;Decreased step length - left;Decreased step length - right Gait velocity: decr   General Gait Details: min guard for safety, verbal cuing for placement in RW.   Stairs             Wheelchair Mobility    Modified Rankin (Stroke Patients Only)       Balance Overall balance assessment: Needs assistance Sitting-balance support: No upper extremity supported;Feet supported Sitting balance-Leahy Scale: Good Sitting balance - Comments: sat EOB with close min guard for safety   Standing balance support: Bilateral upper extremity supported Standing balance-Leahy Scale: Poor Standing balance comment: reliant on external support in standing                            Cognition Arousal/Alertness: Awake/alert Behavior During Therapy: WFL for tasks assessed/performed Overall Cognitive Status: Within Functional Limits for tasks assessed                                 General Comments: self- limiting behaviors      Exercises Total Joint Exercises Quad Sets: AROM;Right;10 reps;Seated    General Comments        Pertinent Vitals/Pain Pain Assessment: Faces Pain Score: 10-Worst  pain ever Faces Pain Scale: Hurts whole lot Pain Location: stomach; constipated Pain Descriptors / Indicators: Cramping;Discomfort;Grimacing Pain Intervention(s): Limited activity within patient's tolerance;Monitored during session;Other (comment)(pt has had multiple medications for constipation)    Home Living                      Prior Function            PT Goals (current goals can now  be found in the care plan section) Acute Rehab PT Goals Patient Stated Goal: to get his infection cleared up PT Goal Formulation: With patient Time For Goal Achievement: 12/03/18 Potential to Achieve Goals: Good Progress towards PT goals: Progressing toward goals    Frequency    Min 3X/week      PT Plan Current plan remains appropriate    Co-evaluation              AM-PAC PT "6 Clicks" Mobility   Outcome Measure  Help needed turning from your back to your side while in a flat bed without using bedrails?: A Little Help needed moving from lying on your back to sitting on the side of a flat bed without using bedrails?: A Little Help needed moving to and from a bed to a chair (including a wheelchair)?: A Little Help needed standing up from a chair using your arms (e.g., wheelchair or bedside chair)?: A Little Help needed to walk in hospital room?: A Little Help needed climbing 3-5 steps with a railing? : A Little 6 Click Score: 18    End of Session Equipment Utilized During Treatment: Gait belt Activity Tolerance: Patient limited by fatigue;Patient limited by pain Patient left: in bed;with call bell/phone within reach Nurse Communication: Mobility status PT Visit Diagnosis: Other abnormalities of gait and mobility (R26.89);Pain Pain - Right/Left: Right Pain - part of body: Knee     Time: [id number] PT Time Calculation (min) (ACUTE ONLY): 21 min  Charges:  $Gait Training: 8-22 mins                     Idaliz Tinkle E, PT Acute Rehabilitation Services Pager [phone]  Office [phone]   Shantrell Placzek D Elonda Husky 11/28/2018, 4:43 PM

## 2018-11-29 ENCOUNTER — Ambulatory Visit: Payer: Medicare Other | Admitting: Orthopedic Surgery

## 2018-11-29 LAB — BASIC METABOLIC PANEL
Anion gap: 10 (ref 5–15)
BUN: 12 mg/dL (ref 6–20)
CO2: 22 mmol/L (ref 22–32)
Calcium: 8.1 mg/dL — ABNORMAL LOW (ref 8.9–10.3)
Chloride: 84 mmol/L — ABNORMAL LOW (ref 98–111)
Creatinine, Ser: 0.61 mg/dL (ref 0.61–1.24)
GFR calc Af Amer: >60 mL/min (ref >60)
GFR calc non Af Amer: >60 mL/min (ref >60)
Glucose, Bld: 98 mg/dL (ref 70–99)
Potassium: 3.5 mmol/L (ref 3.5–5.1)
Sodium: 116 mmol/L — CL (ref 135–145)

## 2018-11-29 LAB — GLUCOSE, CAPILLARY
Glucose-Capillary: 162 mg/dL — ABNORMAL HIGH (ref 70–99)
Glucose-Capillary: 212 mg/dL — ABNORMAL HIGH (ref 70–99)
Glucose-Capillary: 151 mg/dL — ABNORMAL HIGH (ref 70–99)
Glucose-Capillary: 231 mg/dL — ABNORMAL HIGH (ref 70–99)

## 2018-11-29 MED ORDER — SODIUM CHLORIDE 0.9 % IV SOLN
INTRAVENOUS | Status: DC
  Administered 2018-11-29 – 2018-12-01 (×4): via INTRAVENOUS

## 2018-11-29 MED ORDER — SORBITOL 70 % SOLN
960.0000 mL | TOPICAL_OIL | Freq: Once | ORAL | Status: AC
  Administered 2018-11-29: 960 mL via RECTAL
  Filled 2018-11-29 (×2): qty 473

## 2018-11-29 NOTE — Progress Notes (Signed)
CRITICAL VALUE ALERT  Critical Value:  Na 116  Date & Time Notied:  11/30/2018 @ [id number]  Provider Notified: Charolotte Capuchin MD via secure chat  Orders Received/Actions taken: Awaiting for further orders. Will continue to monitor

## 2018-11-29 NOTE — Progress Notes (Signed)
PHARMACY CONSULT NOTE FOR:  OUTPATIENT  PARENTERAL ANTIBIOTIC THERAPY (OPAT)  Indication: prosthetic joint infection Regimen: Vancomycin 1 gm IV q12h End date: 01/03/19  IV antibiotic discharge orders have been updated and are pended. (changed from prior regimen of 1250 mg IV q12h, based on vanc levels 11/28/18) To discharging provider:  please sign these orders via discharge navigator,  Select New Orders & click on the button choice - Manage This Unsigned Work.     Thank you for allowing pharmacy to be a part of this patient's care.  Arty Baumgartner, [place] Pager: [phone] or phone: [phone] 11/29/2018, 9:24 AM

## 2018-11-29 NOTE — Progress Notes (Signed)
PROGRESS NOTE    [name]   [id number]  DOB: 08-08-1965  DOA: 11/16/2018 PCP: Lucia Gaskins, MD   Brief Narrative:  [name] is a 53 y/o male with DM2, CVA. 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 He drove to [place] last weekend to get his son and on the drive back he noted the swelling in his knee which extended down to his foot along with redness. He had pain as well. He did not not fevers or chills. He does not recall any injury to the knee. He made an appt with Dr Sharol Given on 10/28 for later in the week but then decided to come to the ED. He was last admitted 10/12-10/13 when he was brought in unresponsive due to overdose of Oxycodone.  In the ED, the knee was aspirated and he was stared on IV antibiotics for possible septic arthritis. Knee aspirated again on 10/30 in OR. Found to be MRSA +. On Vancomycin.  He is status post removal of hardware and placement of antibiotic spacer by Ortho.  Ambulating with physical therapy.  He will need 6 weeks of IV vancomycin. Ortho is okay for discharge.  Social worker is setting up home health services. IPatient was planned for discharge on 11/8, but late reported CBC on 11/7, showed hemoglobin of 6.6.  He is status post transfusion of 2 units of packed red blood cell overnight.  His blood pressure has been elevated, coupled with hyponatremia, leukocytosis and a feeling of unwellness, and also with gaseous distention of abdomen. Leukocytosis has been ongoing without fever but with a bump status post blood transfusion. Monitor for any fever and obtain blood culture with initiation of antibiotics.     Subjective: Patient continues to complain of constipation despite aggressive bowel regimen.  He states his right knee pain is tolerable and denies fevers or chills.  Arrangements in place for home health services  on discharge.  Assessment & Plan:  Principal Problem: Septic right knee joint -  MRSA bacteremia SIRS with tachycardia and leukocytosis MRSA bacteremia. Possible innoculation from foot ulcer.  Fluid aspirated- cell count 16,050, 93% neutrophils no organisms on gram stain and no crystals - 10/30 > s/p removal of 100 cc of pus like material - also has grown  MRSA He is status post hardware removal and placement of antibiotic spacer right knee.  PICC line was placed and patient will need 6 weeks of IV antibiotics.  He will also need home wound care prior to discharge.  Hyponatremia -We will send off hyponatremia labs as a repeat sodium confirmed the previous value of 116 to be seemingly accurate -Avoid rapid correction of sodium: We will avoid correcting sodium by more than 8 to 10 mEq per 24-hour.  Active Problems: Insomnia Continue with Ambien      Diabetes mellitus type 2 in nonobese  His hemoglobin A1c is 6.4 Continue with sliding scale insulin Fingersticks before meals and at bedtime Hypoglycemic protocol.    Hypokalemia Monitor and replete    Anemia, microcytic Continue with iron supplementation and vitamin B12.  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   Left arm numbness and tingling - MRI brain negative Follow-up with outpatient EMG  Polysubstance abuse/prescription drug overdose. Judicious  use of opiates for pain control   Constipation -Continue aggressive bowel regimen and avoid narcotic medications as much as possible -Smog enema trial today Continue PT OT  DVT prophylaxis: Lovenox Code Status: Full code Family Communication: Discussed with patient Disposition Plan: Possible discharge home tomorrow  Consultants:   ortho Procedures: None  Antimicrobials: Vancomycin Anti-infectives  (From admission, onward)   Start     Dose/Rate Route Frequency Ordered Stop   11/29/18 0400  vancomycin (VANCOCIN) IVPB 1000 mg/200 mL premix     1,000 mg 200 mL/hr over 60 Minutes Intravenous Every 12 hours 11/28/18 2207     11/28/18 2215  vancomycin (VANCOCIN) IVPB 1000 mg/200 mL premix  Status:  Discontinued     1,000 mg 200 mL/hr over 60 Minutes Intravenous Every 12 hours 11/28/18 2206 11/28/18 2207   11/25/18 0400  vancomycin (VANCOCIN) 1,250 mg in sodium chloride 0.9 % 250 mL IVPB  Status:  Discontinued     1,250 mg 166.7 mL/hr over 90 Minutes Intravenous Every 12 hours 11/24/18 1556 11/28/18 2206   11/25/18 0000  vancomycin IVPB  Status:  Discontinued     1,250 mg Intravenous Every 12 hours 11/25/18 1558 11/29/18    11/23/18 1137  vancomycin (VANCOCIN) powder  Status:  Discontinued       As needed 11/23/18 1137 11/23/18 1230   11/23/18 1000  ceFAZolin (ANCEF) IVPB 2g/100 mL premix     2 g 200 mL/hr over 30 Minutes Intravenous On call to O.R. 11/23/18 0707 11/23/18 1102   11/21/18 1600  vancomycin (VANCOCIN) 1,500 mg in sodium chloride 0.9 % 500 mL IVPB  Status:  Discontinued     1,500 mg 250 mL/hr over 120 Minutes Intravenous Every 12 hours 11/21/18 1012 11/24/18 1556   11/20/18 0400  vancomycin (VANCOCIN) 1,250 mg in sodium chloride 0.9 % 250 mL IVPB  Status:  Discontinued     1,250 mg 166.7 mL/hr over 90 Minutes Intravenous Every 12 hours 11/19/18 1509 11/21/18 1012   11/19/18 1600  vancomycin (VANCOCIN) 2,000 mg in sodium chloride 0.9 % 500 mL IVPB     2,000 mg 250 mL/hr over 120 Minutes Intravenous  Once 11/19/18 1509 11/19/18 1816   11/18/18 0600  ceFAZolin (ANCEF) IVPB 2g/100 mL premix  Status:  Discontinued     2 g 200 mL/hr over 30 Minutes Intravenous On call to O.R. 11/17/18 2043 11/18/18 1052   11/17/18 1400  ceFAZolin (ANCEF) IVPB 2g/100 mL premix  Status:  Discontinued     2 g 200 mL/hr over 30 Minutes Intravenous Every 8 hours 11/17/18 1008 11/20/18 1307    11/17/18 1200  vancomycin (VANCOCIN) 1,250 mg in sodium chloride 0.9 % 250 mL IVPB  Status:  Discontinued     1,250 mg 166.7 mL/hr over 90 Minutes Intravenous Every 12 hours 11/17/18 0204 11/17/18 0730   11/17/18 0800  ceFEPIme (MAXIPIME) 2 g in sodium chloride 0.9 % 100 mL IVPB  Status:  Discontinued     2 g 200 mL/hr over 30 Minutes Intravenous Every 8 hours 11/17/18 0204 11/17/18 0730   11/17/18 0115  vancomycin (VANCOCIN) IVPB 1000 mg/200 mL premix     1,000 mg 200 mL/hr over 60 Minutes Intravenous  Once 11/17/18 0000 11/17/18 0254   11/17/18 0015  vancomycin (VANCOCIN) IVPB 1000 mg/200 mL premix     1,000 mg 200 mL/hr over 60 Minutes Intravenous  Once 11/17/18 0000 11/17/18 0153   11/16/18 2330  vancomycin (VANCOCIN) 2,000 mg in sodium chloride 0.9 % 500  mL IVPB  Status:  Discontinued     2,000 mg 250 mL/hr over 120 Minutes Intravenous  Once 11/16/18 2319 11/17/18 0000   11/16/18 2330  ceFEPIme (MAXIPIME) 2 g in sodium chloride 0.9 % 100 mL IVPB     2 g 200 mL/hr over 30 Minutes Intravenous  Once 11/16/18 2319 11/17/18 0045       Objective: Vitals:   11/28/18 0602 11/28/18 1203 11/28/18 2000 11/29/18 0506  BP: (!) 165/90 138/87 137/86 122/69  Pulse: 79 77 76 86  Resp:  18 18 16   Temp: 98.5 F (36.9 C) 98.2 F (36.8 C) 97.9 F (36.6 C) 98.4 F (36.9 C)  TempSrc: Oral Oral Oral Oral  SpO2: 97% 97% 100% 98%  Weight:      Height:        Intake/Output Summary (Last 24 hours) at 11/29/2018 1501 Last data filed at 11/29/2018 0815 Gross per 24 hour  Intake 195.8 ml  Output 2125 ml  Net -1929.2 ml   Filed Weights   11/20/18 0500 11/21/18 0500 11/23/18 0923  Weight: 84.3 kg 79.7 kg 79.7 kg    Examination: General exam: Appears comfortable  HEENT: PERRLA, oral mucosa moist, no sclera icterus or thrush Respiratory system: Clear to auscultation. Respiratory effort normal. Cardiovascular system: S1 & S2 heard,  No murmurs  Gastrointestinal system: Abdomen soft,  non-tender, nondistended. Normal bowel sounds   Central nervous system: Alert and oriented. No focal neurological deficits. Extremities: No cyanosis, clubbing - right knee in dressing with drain- right foot with transmetatarsal amputation is swollen Psychiatry:  Mood & affect appropriate.  Skin:    Left foot    LOS: 13 days   Total time spent on this encounter is 25 minutes.  Charolotte Capuchin, MD [facility] [email or web address] Password [email or web address] 11/29/2018, 3:01 PM

## 2018-11-30 LAB — BASIC METABOLIC PANEL
Anion gap: 10 (ref 5–15)
BUN: 13 mg/dL (ref 6–20)
CO2: 22 mmol/L (ref 22–32)
Calcium: 8.2 mg/dL — ABNORMAL LOW (ref 8.9–10.3)
Chloride: 93 mmol/L — ABNORMAL LOW (ref 98–111)
Creatinine, Ser: 0.74 mg/dL (ref 0.61–1.24)
GFR calc Af Amer: >60 mL/min (ref >60)
GFR calc non Af Amer: >60 mL/min (ref >60)
Glucose, Bld: 136 mg/dL — ABNORMAL HIGH (ref 70–99)
Potassium: 3.6 mmol/L (ref 3.5–5.1)
Sodium: 125 mmol/L — ABNORMAL LOW (ref 135–145)

## 2018-11-30 LAB — GLUCOSE, CAPILLARY
Glucose-Capillary: 175 mg/dL — ABNORMAL HIGH (ref 70–99)
Glucose-Capillary: 243 mg/dL — ABNORMAL HIGH (ref 70–99)
Glucose-Capillary: 122 mg/dL — ABNORMAL HIGH (ref 70–99)
Glucose-Capillary: 198 mg/dL — ABNORMAL HIGH (ref 70–99)

## 2018-11-30 MED ORDER — ZOLPIDEM TARTRATE 5 MG PO TABS
5.0000 mg | ORAL_TABLET | Freq: Every evening | ORAL | Status: DC | PRN
  Administered 2018-11-30 (×2): 5 mg via ORAL
  Filled 2018-11-30 (×2): qty 1

## 2018-11-30 NOTE — Progress Notes (Signed)
PROGRESS NOTE    Shunsuke Masciarelli   [id number]  DOB: [birth year]  DOA: 11/16/2018 PCP: Lucia Gaskins, MD   Brief Narrative:  [name] is a 53 y/o male with DM2, CVA. 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 He drove to [place] last weekend to get his son and on the drive back he noted the swelling in his knee which extended down to his foot along with redness. He had pain as well. He did not not fevers or chills. He does not recall any injury to the knee. He made an appt with Dr Sharol Given on 10/28 for later in the week but then decided to come to the ED. He was last admitted 10/12-10/13 when he was brought in unresponsive due to overdose of Oxycodone.  In the ED, the knee was aspirated and he was stared on IV antibiotics for possible septic arthritis. Knee aspirated again on 10/30 in OR. Found to be MRSA +. On Vancomycin.  He is status post removal of hardware and placement of antibiotic spacer by Ortho.  Ambulating with physical therapy.  He will need 6 weeks of IV vancomycin. Ortho is okay for discharge.  Social worker is setting up home health services. IPatient was planned for discharge on 11/8, but late reported CBC on 11/7, showed hemoglobin of 6.6.  He is status post transfusion of 2 units of packed red blood cell overnight.  His blood pressure has been elevated, coupled with hyponatremia, leukocytosis and a feeling of unwellness, and also with gaseous distention of abdomen.    Subjective: Patient states the smog enema worked wonders yesterday and he has had multiple bowel movements since then with much relief.  He denies any confusion, fevers, chills, nausea, vomiting.  He is ready to be discharged tomorrow morning if his sodium continues to trend in the right direction.  Assessment & Plan:  Principal Problem: Septic right knee joint -  MRSA  bacteremia SIRS with tachycardia and leukocytosis MRSA bacteremia. Possible innoculation from foot ulcer.  Fluid aspirated- cell count 16,050, 93% neutrophils no organisms on gram stain and no crystals - 10/30 > s/p removal of 100 cc of pus like material - also has grown  MRSA He is status post hardware removal and placement of antibiotic spacer right knee.  PICC line was placed and patient will need 6 weeks of IV antibiotics.  He will also need home wound care prior to discharge.  Hyponatremia -Likely hypovolemic hypotonic hyponatremia -Improved with IV fluids, we will continue to encourage p.o. intake and repeat sodium in the morning  Active Problems: Insomnia Continue with Ambien      Diabetes mellitus type 2 in nonobese  His hemoglobin A1c is 6.4 Continue with sliding scale insulin Fingersticks before meals and at bedtime Hypoglycemic protocol.    Hypokalemia Monitor and replete    Anemia, microcytic Continue with iron supplementation and vitamin B12.  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   Left arm numbness and tingling - MRI brain negative Follow-up with outpatient EMG  Polysubstance abuse/prescription drug overdose. Judicious use of opiates for pain control   Constipation -Continue aggressive bowel regimen and avoid narcotic medications as much as possible -Smog enema trial was successful Continue PT OT  DVT  prophylaxis: Lovenox Code Status: Full code Family Communication: Discussed with patient Disposition Plan: Possible discharge home tomorrow morning if sodium continues to trend in the right direction Consultants:   ortho Procedures: None  Antimicrobials: Vancomycin Anti-infectives (From admission, onward)   Start     Dose/Rate Route Frequency Ordered Stop   11/29/18 0400   vancomycin (VANCOCIN) IVPB 1000 mg/200 mL premix     1,000 mg 200 mL/hr over 60 Minutes Intravenous Every 12 hours 11/28/18 2207     11/28/18 2215  vancomycin (VANCOCIN) IVPB 1000 mg/200 mL premix  Status:  Discontinued     1,000 mg 200 mL/hr over 60 Minutes Intravenous Every 12 hours 11/28/18 2206 11/28/18 2207   11/25/18 0400  vancomycin (VANCOCIN) 1,250 mg in sodium chloride 0.9 % 250 mL IVPB  Status:  Discontinued     1,250 mg 166.7 mL/hr over 90 Minutes Intravenous Every 12 hours 11/24/18 1556 11/28/18 2206   11/25/18 0000  vancomycin IVPB  Status:  Discontinued     1,250 mg Intravenous Every 12 hours 11/25/18 1558 11/29/18    11/23/18 1137  vancomycin (VANCOCIN) powder  Status:  Discontinued       As needed 11/23/18 1137 11/23/18 1230   11/23/18 1000  ceFAZolin (ANCEF) IVPB 2g/100 mL premix     2 g 200 mL/hr over 30 Minutes Intravenous On call to O.R. 11/23/18 0707 11/23/18 1102   11/21/18 1600  vancomycin (VANCOCIN) 1,500 mg in sodium chloride 0.9 % 500 mL IVPB  Status:  Discontinued     1,500 mg 250 mL/hr over 120 Minutes Intravenous Every 12 hours 11/21/18 1012 11/24/18 1556   11/20/18 0400  vancomycin (VANCOCIN) 1,250 mg in sodium chloride 0.9 % 250 mL IVPB  Status:  Discontinued     1,250 mg 166.7 mL/hr over 90 Minutes Intravenous Every 12 hours 11/19/18 1509 11/21/18 1012   11/19/18 1600  vancomycin (VANCOCIN) 2,000 mg in sodium chloride 0.9 % 500 mL IVPB     2,000 mg 250 mL/hr over 120 Minutes Intravenous  Once 11/19/18 1509 11/19/18 1816   11/18/18 0600  ceFAZolin (ANCEF) IVPB 2g/100 mL premix  Status:  Discontinued     2 g 200 mL/hr over 30 Minutes Intravenous On call to O.R. 11/17/18 2043 11/18/18 1052   11/17/18 1400  ceFAZolin (ANCEF) IVPB 2g/100 mL premix  Status:  Discontinued     2 g 200 mL/hr over 30 Minutes Intravenous Every 8 hours 11/17/18 1008 11/20/18 1307   11/17/18 1200  vancomycin (VANCOCIN) 1,250 mg in sodium chloride 0.9 % 250 mL IVPB  Status:   Discontinued     1,250 mg 166.7 mL/hr over 90 Minutes Intravenous Every 12 hours 11/17/18 0204 11/17/18 0730   11/17/18 0800  ceFEPIme (MAXIPIME) 2 g in sodium chloride 0.9 % 100 mL IVPB  Status:  Discontinued     2 g 200 mL/hr over 30 Minutes Intravenous Every 8 hours 11/17/18 0204 11/17/18 0730   11/17/18 0115  vancomycin (VANCOCIN) IVPB 1000 mg/200 mL premix     1,000 mg 200 mL/hr over 60 Minutes Intravenous  Once 11/17/18 0000 11/17/18 0254   11/17/18 0015  vancomycin (VANCOCIN) IVPB 1000 mg/200 mL premix     1,000 mg 200 mL/hr over 60 Minutes Intravenous  Once 11/17/18 0000 11/17/18 0153   11/16/18 2330  vancomycin (VANCOCIN) 2,000 mg in sodium chloride 0.9 % 500 mL IVPB  Status:  Discontinued     2,000 mg 250 mL/hr over 120 Minutes Intravenous  Once 11/16/18  2319 11/17/18 0000   11/16/18 2330  ceFEPIme (MAXIPIME) 2 g in sodium chloride 0.9 % 100 mL IVPB     2 g 200 mL/hr over 30 Minutes Intravenous  Once 11/16/18 2319 11/17/18 0045       Objective: Vitals:   11/29/18 0506 11/29/18 1500 11/29/18 2213 11/30/18 0515  BP: 122/69 128/79 102/65 121/71  Pulse: 86 72 72 83  Resp: 16 18 17 17   Temp: 98.4 F (36.9 C) 97.7 F (36.5 C) 98.1 F (36.7 C) 98.8 F (37.1 C)  TempSrc: Oral Oral Oral Oral  SpO2: 98% 100% 99% 99%  Weight:      Height:        Intake/Output Summary (Last 24 hours) at 11/30/2018 1533 Last data filed at 11/30/2018 1500 Gross per 24 hour  Intake 4243.4 ml  Output 4600 ml  Net -356.6 ml   Filed Weights   11/20/18 0500 11/21/18 0500 11/23/18 0923  Weight: 84.3 kg 79.7 kg 79.7 kg    Examination: General exam: Appears comfortable  HEENT: PERRLA, oral mucosa moist, no sclera icterus or thrush Respiratory system: Clear to auscultation. Respiratory effort normal. Cardiovascular system: S1 & S2 heard,  No murmurs  Gastrointestinal system: Abdomen soft, non-tender, nondistended. Normal bowel sounds   Central nervous system: Alert and oriented. No focal  neurological deficits. Extremities: No cyanosis, clubbing - right knee in dressing with drain- right foot with transmetatarsal amputation is swollen Psychiatry:  Mood & affect appropriate.  Skin:    Left foot    LOS: 14 days   Total time spent on this encounter is 25 minutes.  Charolotte Capuchin, MD [facility] [email or web address] Password [email or web address] 11/30/2018, 3:33 PM

## 2018-11-30 NOTE — Progress Notes (Signed)
Occupational Therapy Treatment Patient Details Name: [name] MRN: [id number] DOB: 1965-03-26 Today's Date: 11/30/2018    History of present illness Pt is a 53 y/o male admitted secondary to R knee pain. Pt is s/p irrigation and debridement right total knee arthroplasty arthroscopically. Cultures were found to have gram-positive cocci in all. S/p removal total knee with placement of an anatomic antibiotic spacer on 11/4. PMH including but not limited to DM, ETOH abuse, R transmetatarsal amputation (05/2018)   OT comments  Pt making steady progress towards OT goals this session. Session focus on LUE HEP and functional mobility. Pt complete functional mobility in room with RW with min guard assist. Pt more motivated to move this session with pt walking to 2 laps in room. Pt complete LUE therapeutic exercises as indicated below. Pt would benefit from LUE HEP to carryover at home. Pt completes LB dressing from EOB with supervision. DC plan remains appropriate, will continue to follow acutely per POC.    Follow Up Recommendations  Home health OT;Supervision - Intermittent    Equipment Recommendations  3 in 1 bedside commode;Other (comment)    Recommendations for Other Services      Precautions / Restrictions Precautions Precautions: Fall Other Brace: wrist splint to L Restrictions Weight Bearing Restrictions: Yes RLE Weight Bearing: Weight bearing as tolerated Other Position/Activity Restrictions: minimize ROM of knee per MD order       Mobility Bed Mobility Overal bed mobility: Needs Assistance Bed Mobility: Supine to Sit;Sit to Supine     Supine to sit: Min guard Sit to supine: Min assist   General bed mobility comments: HOB elevated, use of bed rails, min A needed to return R LE to bed  Transfers Overall transfer level: Needs assistance Equipment used: Rolling walker (2 wheeled) Transfers: Sit to/from Stand Sit to Stand: Min guard;From elevated surface          General transfer comment: min guard for safety, pt able to extend R leg out when descending onto surfaces w/o cues    Balance Overall balance assessment: Needs assistance Sitting-balance support: No upper extremity supported;Feet supported Sitting balance-Leahy Scale: Good Sitting balance - Comments: able to reach feet from EOB with no LOB   Standing balance support: Bilateral upper extremity supported Standing balance-Leahy Scale: Poor Standing balance comment: reliant on BUE in standing                           ADL either performed or assessed with clinical judgement   ADL Overall ADL's : Needs assistance/impaired                     Lower Body Dressing: Supervision/safety;Sitting/lateral leans Lower Body Dressing Details (indicate cue type and reason): able to access feet from EOB Toilet Transfer: Min guard;Ambulation;RW Toilet Transfer Details (indicate cue type and reason): simulated via funcitonal mobility with RW; min guard mostly to assist with keeping gown closed       Tub/Shower Transfer Details (indicate cue type and reason): pt reports he needs a seat for his shower, however he lives in mobile home and his shower is very narrow Functional mobility during ADLs: Min guard;Rolling walker General ADL Comments: session focus on LUE HEP, and funcitonal mobility, pt complete x2 laps in room with min guard using RW     Vision Baseline Vision/History: Wears glasses Patient Visual Report: No change from baseline     Perception     Praxis  Cognition Arousal/Alertness: Awake/alert Behavior During Therapy: WFL for tasks assessed/performed Overall Cognitive Status: Within Functional Limits for tasks assessed                                          Exercises Hand Exercises Forearm Supination: AROM;Left;Seated;5 reps Forearm Pronation: AROM;Left;5 reps;Seated Wrist Flexion: AROM;Left;5 reps;Seated Wrist Extension: Left;5  reps;AROM;Seated Digit Composite Flexion: AROM;Left;5 reps;Seated Composite Extension: AROM;Left;5 reps;Seated   Shoulder Instructions       General Comments education provided on positioning of LUE while resting, keeping wrist in neutral and fingers/ thumb in extension, trialed hand exerciser but uneffective d/t poor positioning, pt reports he needs new wrist orthosis, educated pt on process of obtainng new orthosis, pt reports difficulty hold RW on L side, did not notice any difficult, pt much more functional with wrist orthosis on    Pertinent Vitals/ Pain       Pain Assessment: 0-10 Pain Score: 7  Pain Location: R knee Pain Descriptors / Indicators: Sore Pain Intervention(s): Monitored during session;Repositioned  Home Living                                          Prior Functioning/Environment              Frequency  Min 2X/week        Progress Toward Goals  OT Goals(current goals can now be found in the care plan section)  Progress towards OT goals: Progressing toward goals  Acute Rehab OT Goals Patient Stated Goal: to get his infection cleared up OT Goal Formulation: With patient Time For Goal Achievement: 12/04/18 Potential to Achieve Goals: Good  Plan Discharge plan remains appropriate    Co-evaluation                 AM-PAC OT "6 Clicks" Daily Activity     Outcome Measure   Help from another person eating meals?: None Help from another person taking care of personal grooming?: None Help from another person toileting, which includes using toliet, bedpan, or urinal?: A Little Help from another person bathing (including washing, rinsing, drying)?: A Little Help from another person to put on and taking off regular upper body clothing?: None Help from another person to put on and taking off regular lower body clothing?: A Little 6 Click Score: 21    End of Session Equipment Utilized During Treatment: Rolling walker  OT Visit  Diagnosis: Other abnormalities of gait and mobility (R26.89);Muscle weakness (generalized) (M62.81);Pain Pain - Right/Left: Right Pain - part of body: Knee   Activity Tolerance Patient tolerated treatment well   Patient Left in bed;with call bell/phone within reach   Nurse Communication          Time: [id number] OT Time Calculation (min): 41 min  Charges: OT General Charges $OT Visit: 1 Visit OT Treatments $Therapeutic Activity: 23-37 mins $Therapeutic Exercise: 8-22 mins    Lanier Clam., COTA/L Acute Rehabilitation Services [phone]    Ihor Gully 11/30/2018, 3:37 PM

## 2018-11-30 NOTE — Progress Notes (Signed)
Physical Therapy Treatment Patient Details Name: [name] MRN: [id number] DOB: [birth year] Today's Date: 11/30/2018    History of Present Illness Pt is a 53 y/o male admitted secondary to R knee pain. Pt is s/p irrigation and debridement right total knee arthroplasty arthroscopically. Cultures were found to have gram-positive cocci in all. S/p removal total knee with placement of an anatomic antibiotic spacer on 11/4. PMH including but not limited to DM, ETOH abuse, R transmetatarsal amputation (05/2018)    PT Comments    Pt making slow progress with mobility. Remains very limited secondary to pain. However, able to increase his distance ambulated this session and required less physical assistance overall. Pt would continue to benefit from skilled physical therapy services at this time while admitted and after d/c to address the below listed limitations in order to improve overall safety and independence with functional mobility.   Follow Up Recommendations  Home health PT;Supervision/Assistance - 24 hour     Equipment Recommendations  None recommended by PT    Recommendations for Other Services       Precautions / Restrictions Precautions Precautions: Fall Restrictions Weight Bearing Restrictions: Yes RLE Weight Bearing: Weight bearing as tolerated    Mobility  Bed Mobility Overal bed mobility: Needs Assistance Bed Mobility: Supine to Sit;Sit to Supine     Supine to sit: Min guard Sit to supine: Min assist   General bed mobility comments: HOB elevated, use of bed rails, min A needed to return R LE to bed  Transfers Overall transfer level: Needs assistance Equipment used: Rolling walker (2 wheeled) Transfers: Sit to/from Stand Sit to Stand: Min guard;From elevated surface         General transfer comment: min guard for safety, cueing for safe hand placement  Ambulation/Gait Ambulation/Gait assistance: Min guard Gait Distance (Feet): 40 Feet Assistive device:  Rolling walker (2 wheeled) Gait Pattern/deviations: Step-to pattern;Decreased weight shift to right;Decreased stance time - right;Antalgic;Trunk flexed;Decreased step length - left;Decreased step length - right Gait velocity: decreased   General Gait Details: min guard for safety, no overt LOB or need for physical assistance   Stairs             Wheelchair Mobility    Modified Rankin (Stroke Patients Only)       Balance Overall balance assessment: Needs assistance Sitting-balance support: No upper extremity supported;Feet supported Sitting balance-Leahy Scale: Good     Standing balance support: Bilateral upper extremity supported Standing balance-Leahy Scale: Poor Standing balance comment: reliant on external support in standing                            Cognition Arousal/Alertness: Awake/alert Behavior During Therapy: WFL for tasks assessed/performed Overall Cognitive Status: Within Functional Limits for tasks assessed                                        Exercises      General Comments        Pertinent Vitals/Pain Pain Assessment: Faces Faces Pain Scale: Hurts whole lot Pain Location: R knee Pain Descriptors / Indicators: Sore Pain Intervention(s): Monitored during session;Repositioned    Home Living                      Prior Function            PT Goals (current  goals can now be found in the care plan section) Acute Rehab PT Goals PT Goal Formulation: With patient Time For Goal Achievement: 12/03/18 Potential to Achieve Goals: Good Progress towards PT goals: Progressing toward goals    Frequency    Min 3X/week      PT Plan Current plan remains appropriate    Co-evaluation              AM-PAC PT "6 Clicks" Mobility   Outcome Measure  Help needed turning from your back to your side while in a flat bed without using bedrails?: A Little Help needed moving from lying on your back to sitting  on the side of a flat bed without using bedrails?: A Little Help needed moving to and from a bed to a chair (including a wheelchair)?: A Little Help needed standing up from a chair using your arms (e.g., wheelchair or bedside chair)?: A Little Help needed to walk in hospital room?: A Little Help needed climbing 3-5 steps with a railing? : A Little 6 Click Score: 18    End of Session   Activity Tolerance: Patient limited by fatigue;Patient limited by pain Patient left: in bed;with call bell/phone within reach Nurse Communication: Mobility status PT Visit Diagnosis: Other abnormalities of gait and mobility (R26.89);Pain Pain - Right/Left: Right Pain - part of body: Knee     Time: [id number] PT Time Calculation (min) (ACUTE ONLY): 25 min  Charges:  $Gait Training: 8-22 mins $Therapeutic Activity: 8-22 mins                     Anastasio Champion, DPT  Acute Rehabilitation Services Pager [phone] Office [place] 11/30/2018, 9:55 AM

## 2018-11-30 NOTE — Plan of Care (Signed)
  Problem: Education: Goal: Knowledge of General Education information will improve Description: Including pain rating scale, medication(s)/side effects and non-pharmacologic comfort measures Outcome: Progressing   Problem: Health Behavior/Discharge Planning: Goal: Ability to manage health-related needs will improve Outcome: Progressing   Problem: Clinical Measurements: Goal: Ability to maintain clinical measurements within normal limits will improve Outcome: Progressing Goal: Will remain free from infection Outcome: Progressing Goal: Diagnostic test results will improve Outcome: Progressing Goal: Cardiovascular complication will be avoided Outcome: Progressing   Problem: Activity: Goal: Risk for activity intolerance will decrease Outcome: Progressing   Problem: Coping: Goal: Level of anxiety will decrease Outcome: Progressing   Problem: Elimination: Goal: Will not experience complications related to bowel motility Outcome: Progressing Goal: Will not experience complications related to urinary retention Outcome: Progressing   Problem: Pain Managment: Goal: General experience of comfort will improve Outcome: Progressing   Problem: Safety: Goal: Ability to remain free from injury will improve Outcome: Progressing   Problem: Skin Integrity: Goal: Risk for impaired skin integrity will decrease Outcome: Progressing   

## 2018-12-01 LAB — BASIC METABOLIC PANEL WITH GFR
Anion gap: 8 (ref 5–15)
BUN: 9 mg/dL (ref 6–20)
CO2: 22 mmol/L (ref 22–32)
Calcium: 8.2 mg/dL — ABNORMAL LOW (ref 8.9–10.3)
Chloride: 102 mmol/L (ref 98–111)
Creatinine, Ser: 0.77 mg/dL (ref 0.61–1.24)
GFR calc Af Amer: >60 mL/min
GFR calc non Af Amer: >60 mL/min
Glucose, Bld: 123 mg/dL — ABNORMAL HIGH (ref 70–99)
Potassium: 3.8 mmol/L (ref 3.5–5.1)
Sodium: 132 mmol/L — ABNORMAL LOW (ref 135–145)

## 2018-12-01 LAB — GLUCOSE, CAPILLARY
Glucose-Capillary: 191 mg/dL — ABNORMAL HIGH (ref 70–99)
Glucose-Capillary: 138 mg/dL — ABNORMAL HIGH (ref 70–99)

## 2018-12-01 MED ORDER — CARVEDILOL 25 MG PO TABS: 25.0000 mg | ORAL_TABLET | Freq: Two times a day (BID) | ORAL | 0 refills | Status: DC

## 2018-12-01 MED ORDER — VANCOMYCIN IV (FOR PTA / DISCHARGE USE ONLY): 1000.0000 mg | Freq: Two times a day (BID) | INTRAVENOUS | 34 refills | Status: DC

## 2018-12-01 MED ORDER — AMLODIPINE BESYLATE 5 MG PO TABS: 5.0000 mg | ORAL_TABLET | Freq: Every day | ORAL | 0 refills | Status: DC

## 2018-12-01 MED ORDER — HEPARIN SOD (PORK) LOCK FLUSH 100 UNIT/ML IV SOLN
250.0000 [IU] | INTRAVENOUS | Status: AC | PRN
  Administered 2018-12-01: 250 [IU]
  Filled 2018-12-01: qty 2.5

## 2018-12-01 MED ORDER — OXYCODONE HCL 10 MG PO TABS: 10.0000 mg | ORAL_TABLET | Freq: Four times a day (QID) | ORAL | 0 refills | Status: DC | PRN

## 2018-12-01 NOTE — Progress Notes (Signed)
Occupational Therapy Treatment Patient Details Name: [name] MRN: [id number] DOB: 1965/05/31 Today's Date: 12/01/2018    History of present illness Pt is a 53 y/o male admitted secondary to R knee pain. Pt is s/p irrigation and debridement right total knee arthroplasty arthroscopically. Cultures were found to have gram-positive cocci in all. S/p removal total knee with placement of an anatomic antibiotic spacer on 11/4. PMH including but not limited to DM, ETOH abuse, R transmetatarsal amputation (05/2018)   OT comments  Pt making steady progress towards OT goals this session. Pt complete UB ADL sitting with set- up/ supervision assist. MIN guard for LB bathing in standing with RW. Education provided on available shower seats to fit into pts narrow shower. Pt reports he wants to wait to find out exact dimensions before he orders a seat. Issued pt LUE HEP to carryover at home, Demo'ed all therapeutic exercises with pt return demonstrating with good carryover ( see exercise section for reps and specifics). Updated DME recs and let OTR know about change in POC. Will continue to follow acutely per POC.    Follow Up Recommendations  Home health OT;Supervision - Intermittent    Equipment Recommendations  Other (comment)(DME needs are met)    Recommendations for Other Services      Precautions / Restrictions Precautions Precautions: Fall Required Braces or Orthoses: Other Brace Other Brace: wrist splint to L Restrictions Weight Bearing Restrictions: Yes RLE Weight Bearing: Weight bearing as tolerated Other Position/Activity Restrictions: minimize ROM of knee per MD order       Mobility Bed Mobility Overal bed mobility: Needs Assistance Bed Mobility: Sit to Supine       Sit to supine: Min assist   General bed mobility comments: HOB elevated, use of bed rails, min A needed to return R LE to bed  Transfers Overall transfer level: Needs assistance Equipment used: Rolling walker  (2 wheeled) Transfers: Sit to/from Stand Sit to Stand: Supervision         General transfer comment: supervision for safety    Balance Overall balance assessment: Needs assistance Sitting-balance support: No upper extremity supported;Feet supported Sitting balance-Leahy Scale: Good     Standing balance support: Single extremity supported;During functional activity Standing balance-Leahy Scale: Fair Standing balance comment: close min guard when standing for LB dressing                           ADL either performed or assessed with clinical judgement   ADL Overall ADL's : Needs assistance/impaired     Grooming: Wash/dry face;Brushing hair;Sitting;Set up   Upper Body Bathing: Sitting;Set up   Lower Body Bathing: Sit to/from stand;Set up;Min guard Lower Body Bathing Details (indicate cue type and reason): close min gaurd in standing for L B bathing Upper Body Dressing : Set up;Sitting       Toilet Transfer: Ambulation;RW;Supervision/safety         Tub/Shower Transfer Details (indicate cue type and reason): provided pt with information about where to get shower chair that will fit into narrow shower chair at home Functional mobility during ADLs: Supervision/safety;Rolling walker General ADL Comments: pt supervision for UB ADLs/ MIN guard for LB dressing; Pt complete functional mobility with supervision with RW, pt completes seated grooming with set- up assist     Vision Baseline Vision/History: Wears glasses Patient Visual Report: No change from baseline     Perception     Praxis      Cognition Arousal/Alertness: Awake/alert  Behavior During Therapy: WFL for tasks assessed/performed Overall Cognitive Status: Within Functional Limits for tasks assessed                                 General Comments: reports he is feeling depressed after being in the hospital        Exercises Hand Exercises Forearm Supination: AROM;Left;Seated;5  reps Forearm Pronation: AROM;Left;5 reps;Seated Wrist Flexion: AROM;Left;5 reps;Seated Wrist Extension: Left;5 reps;AROM;Seated Wrist Ulnar Deviation: AROM;Left;5 reps;Seated Wrist Radial Deviation: AROM;Left;5 reps;Seated Digit Composite Flexion: AROM;Left;5 reps;Seated Composite Extension: AROM;Left;5 reps;Seated Digit Lifts: AROM;Left;5 reps;Seated Opposition: AROM;Left;5 reps;Seated   Shoulder Instructions       General Comments issued pt LUE HEP to carryover at home; VSS    Pertinent Vitals/ Pain       Pain Assessment: Faces Faces Pain Scale: Hurts a little bit Pain Location: R knee Pain Descriptors / Indicators: Sore Pain Intervention(s): Monitored during session;Repositioned  Home Living                                          Prior Functioning/Environment              Frequency  Min 2X/week        Progress Toward Goals  OT Goals(current goals can now be found in the care plan section)  Progress towards OT goals: Progressing toward goals  Acute Rehab OT Goals Patient Stated Goal: to get his infection cleared up OT Goal Formulation: With patient Time For Goal Achievement: 12/04/18 Potential to Achieve Goals: Good  Plan Discharge plan remains appropriate    Co-evaluation                 AM-PAC OT "6 Clicks" Daily Activity     Outcome Measure   Help from another person eating meals?: None Help from another person taking care of personal grooming?: None Help from another person toileting, which includes using toliet, bedpan, or urinal?: A Little Help from another person bathing (including washing, rinsing, drying)?: A Little Help from another person to put on and taking off regular upper body clothing?: None Help from another person to put on and taking off regular lower body clothing?: A Little 6 Click Score: 21    End of Session Equipment Utilized During Treatment: Rolling walker  OT Visit Diagnosis: Other  abnormalities of gait and mobility (R26.89);Muscle weakness (generalized) (M62.81);Pain Pain - Right/Left: Right Pain - part of body: Knee   Activity Tolerance Patient tolerated treatment well   Patient Left in bed;with call bell/phone within reach   Nurse Communication          Time: 2035-5974 OT Time Calculation (min): 49 min  Charges: OT General Charges $OT Visit: 1 Visit OT Treatments $Self Care/Home Management : 23-37 mins $Therapeutic Exercise: 8-22 mins  Lanier Clam., COTA/L Acute Rehabilitation Services [phone]    Ihor Gully 12/01/2018, 1:52 PM

## 2018-12-01 NOTE — Discharge Planning (Signed)
Patient discharged home in stable condition. Verbalizes understanding of all discharge instructions, including home medications and follow up appointments. 

## 2018-12-01 NOTE — TOC Progression Note (Addendum)
Transition of Care [facility]) - Progression Note    Patient Details  Name: [name] MRN: [id number] Date of Birth: Jun 01, 1965  [facility] CM/SW Contact  Jacalyn Lefevre Edson Snowball, RN Phone Number: 12/01/2018, 12:32 PM  Clinical Narrative:     Continuing to follow for discharge needs. Patient arranged for home IV ABX , pending discharge date.  OT requesting shower chair for home same ordered. [name] decided to cancel shower chair. Called Zack with [employer]  Pam with [facility] and Mateo Flow with [facility] aware discharge today. Patient will receive 1600 dose prior to discharging from hospital    Expected Discharge Plan: [place] Barriers to Discharge: Continued Medical Work up  Expected Discharge Plan and Services Expected Discharge Plan: [place]   Discharge Planning Services: CM Consult Post Acute Care Choice: [place] arrangements for the past 2 months: Single Family Home                 DME Arranged: N/A         HH Arranged: RN, PT [place] Agency: [place] ([place]) Date HH Agency Contacted: 11/24/18 Time HH Agency Contacted: 1120 Representative spoke with at [place]: Mateo Flow with [facility] and Pam with [facility]   Social Determinants of Health ([place]) Interventions    Readmission Risk Interventions No flowsheet data found.

## 2018-12-01 NOTE — Progress Notes (Signed)
PT Cancellation Note  Patient Details Name: [name] MRN: [id number] DOB: [birth year]   Cancelled Treatment:    Reason Eval/Treat Not Completed: Patient declined, no reason specified. Pt refusing as he is planning to d/c home today.    [place] 12/01/2018, 4:06 PM

## 2018-12-01 NOTE — Discharge Summary (Signed)
Physician Discharge Summary  [name] YKD:[id number] DOB: October 13, 1965 DOA: 11/16/2018  PCP: Lucia Gaskins, MD  Admit date: 11/16/2018 Discharge date: 12/01/2018  Admitted From: Home  Disposition: Home with home health  Recommendations for Outpatient Follow-up:  1. Follow up with PCP in 1-2 weeks 2. Please obtain BMP/CBC in one week 3. Patient is to continue IV vancomycin therapy for 6 weeks with an end date of approximately 01/03/2019  Home Health: Yes Equipment/Devices: None  Discharge Condition: Stable CODE STATUS: Full Diet recommendation: Heart Healthy / Carb Modified / Regular / Dysphagia   Brief/Interim Summary: [name] is a 53 y/o male with DM2, CVA. 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 He drove to [place] the weekend before admission to get his son and on the drive back he noted the swelling in his knee which extended down to his foot along with redness. He had pain as well. He did not not fevers or chills. He does not recall any injury to the knee. He made an appt with Dr Sharol Given on 10/28 for later in the week but then decided to come to the ED. He was last admitted 10/12-10/13 when he was brought in unresponsive due to overdose of Oxycodone.  In the ED, the knee was aspirated and he was stared on IV antibiotics for possible septic arthritis. Knee aspirated again on 10/30 in OR. Found to be MRSA +. On Vancomycin.  He is status post removal of hardware and placement of antibiotic spacer by Ortho.  Ambulating with physical therapy.  He will need 6 weeks of IV vancomycin with end date approximately 01/03/2019.    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   Complete by: As directed    Instructions: May follow [place] Dosing Protocol   Instructions: May administer Cathflo as needed to maintain patency of vascular access device.   Instructions: Flushing of vascular access device: per [facility] Protocol: 0.9% NaCl pre/post medication administration and prn patency; Heparin 100 u/ml, 52m for implanted ports and Heparin 10u/ml, 515mfor all other central venous catheters.   Instructions: May follow [facility] Anaphylaxis Protocol for First Dose Administration in the home: 0.9% NaCl at 25-50 ml/hr to maintain IV access for protocol meds. Epinephrine 0.3 ml IV/IM PRN and Benadryl 25-50 IV/IM PRN s/s of anaphylaxis.   Instructions: AdGrahamnfusion Coordinator (RN) to assist per patient IV care needs in the home PRN.   Home infusion instructions [facility] May follow [place] Protocol; May administer Cathflo as needed to maintain patency of vascular access device.; Flushing of vascular access device: per [facility]: 0.9% NaCl pre/post medica...   Complete by: As directed    Instructions: May follow [place]  Protocol   Instructions: May administer Cathflo as needed to maintain patency of vascular access device.   Instructions: Flushing of vascular access device: per [facility] Protocol: 0.9% NaCl pre/post medication administration and prn patency; Heparin 100 u/ml, 33m for implanted ports and Heparin 10u/ml, 530mfor all other central venous catheters.   Instructions:  May follow [facility] Anaphylaxis Protocol for First Dose Administration in the home: 0.9% NaCl at 25-50 ml/hr to maintain IV access for protocol meds. Epinephrine 0.3 ml IV/IM PRN and Benadryl 25-50 IV/IM PRN s/s of anaphylaxis.   Instructions: AdCedar Hill Lakesnfusion Coordinator (RN) to assist per patient IV care needs in the home PRN.   Increase activity slowly   Complete by: As directed    Schedule Transitional Care Management Visit   Complete by: As directed    With PCP within 1-2 weeks for septic arthritis and MRSA bacteremia     Allergies as of 12/01/2018      Reactions   Benadryl [diphenhydramine Hcl] Other (See Comments)   Leg spasms    Trazodone And Nefazodone Other (See Comments)   Leg spasms       Medication List    TAKE these medications   amLODipine 5 MG tablet Commonly known as: NORVASC Take 1 tablet (5 mg total) by mouth daily. Start taking on: December 02, 2018   carvedilol 25 MG tablet Commonly known as: COREG Take 1 tablet (25 mg total) by mouth 2 (two) times daily with a meal.   metFORMIN 500 MG tablet Commonly known as: Glucophage Take 2 tablets (1,000 mg total) by mouth 2 (two) times daily with a meal. What changed: how much to take   nicotine 21 mg/24hr patch Commonly known as: NICODERM CQ - dosed in mg/24 hours Place 1 patch (21 mg total) onto the skin daily.   nitroGLYCERIN 0.2 mg/hr patch Commonly known as: NITRODUR - Dosed in mg/24 hr Place 1 patch (0.2 mg total) onto the skin daily.   Oxycodone HCl 10 MG Tabs Take 1 tablet (10 mg total) by mouth every 6 (six) hours as needed for severe pain. What changed:   when to take this  reasons to take this   pentoxifylline 400 MG CR tablet Commonly known as: TRENTAL Take 1 tablet (400 mg total) by mouth 3 (three) times daily with meals.   polyethylene glycol 17 g packet Commonly known as: MIRALAX / GLYCOLAX Take 17 g by mouth daily as needed for mild constipation.   rosuvastatin 20 MG  tablet Commonly known as: CRESTOR Take 1 tablet (20 mg total) by mouth daily.   senna-docusate 8.6-50 MG tablet Commonly known as: Senokot-S Take 2 tablets by mouth at bedtime.   silver sulfADIAZINE 1 % cream Commonly known as: SILVADENE Apply 1 application topically daily.   vancomycin  IVPB Inject 1,000 mg into the vein every 12 (twelve) hours. Indication:  Prosthetic joint infection Last Day of Therapy:  01/03/19 Labs - Sunday/Monday:  CBC/D, BMP, and vancomycin trough. Labs - Thursday:  BMP and vancomycin trough Labs - Every other week:  ESR and CRP            Home Infusion Instuctions  (From admission, onward)         Start     Ordered   12/01/18 0000  Home infusion instructions [facility] May follow [place] Protocol; May administer Cathflo as needed to maintain patency of vascular access device.; Flushing of vascular access device: per [facility]: 0.9% NaCl pre/post  medica...    Question Answer Comment  Instructions May follow [place] Dosing Protocol   Instructions May administer Cathflo as needed to maintain patency of vascular access device.   Instructions Flushing of vascular access device: per [facility] Protocol: 0.9% NaCl pre/post medication administration and prn patency; Heparin 100 u/ml, 50m for implanted ports and Heparin 10u/ml, 543mfor all other central venous catheters.   Instructions May follow [facility] Anaphylaxis Protocol for First Dose Administration in the home: 0.9% NaCl at 25-50 ml/hr to maintain IV access for protocol meds. Epinephrine 0.3 ml IV/IM PRN and Benadryl 25-50 IV/IM PRN s/s of anaphylaxis.   Instructions [facility] Infusion Coordinator (RN) to assist per patient IV care needs in the home PRN.      12/01/18 1438   11/25/18 0000  Home infusion instructions [facility] May follow [place]osing Protocol; May administer Cathflo as needed to maintain patency of vascular access device.; Flushing of vascular  access device: per [facility]ocol: 0.9% NaCl pre/post medica...    Question Answer Comment  Instructions May follow ACTammsosing Protocol   Instructions May administer Cathflo as needed to maintain patency of vascular access device.   Instructions Flushing of vascular access device: per [facility]: 0.9% NaCl pre/post medication administration and prn patency; Heparin 100 u/ml, 65m59mor implanted ports and Heparin 10u/ml, 65ml32mr all other central venous catheters.   Instructions May follow [facility] Anaphylaxis Protocol for First Dose Administration in the home: 0.9% NaCl at 25-50 ml/hr to maintain IV access for protocol meds. Epinephrine 0.3 ml IV/IM PRN and Benadryl 25-50 IV/IM PRN s/s of anaphylaxis.   Instructions [facility] Infusion Coordinator (RN) to assist per patient IV care needs in the home PRN.      11/25/18 1558         Follow-up Information    DudaNewt Minion In 1 week.   Specialty: Orthopedic Surgery Contact information: [address] [place] [phone]      Allergies  Allergen Reactions  . Benadryl [Diphenhydramine Hcl] Other (See Comments)    Leg spasms   . Trazodone And Nefazodone Other (See Comments)    Leg spasms     Consultations:  Orthopedic surgery  Infectious disease   Procedures/Studies: Dg Abd 1 View  Result Date: 11/27/2018 CLINICAL DATA:  Constipation. EXAM: ABDOMEN - 1 VIEW COMPARISON:  None. FINDINGS: Large amount of stool throughout the colon, particularly within the cecum and ascending colon. No gaseous distended loops of small bowel are identified. Right hip arthroplasty. Osseous structures otherwise unremarkable. IMPRESSION: Large amount of stool within the cecum and ascending colon compatible with constipation. Electronically Signed   By: DrewLovey Newcomer.   On: 11/27/2018 16:26   Dg Knee Complete 4 Views Right  Result Date: 11/16/2018 CLINICAL DATA:  49 y64r old male with right knee pain. EXAM: RIGHT  KNEE - COMPLETE 4+ VIEW COMPARISON:  None. FINDINGS: There is a total right knee arthroplasty. The arthroplasty components appear intact and in anatomic alignment. No evidence of loosening. The bones are mildly osteopenic. There is no acute fracture or dislocation. There is moderate suprapatellar and joint effusion. Correlation with clinical exam is recommended to evaluate for possible infection. The there is diffuse subcutaneous edema. No radiopaque foreign object or soft tissue gas. IMPRESSION: 1. No acute fracture or dislocation. 2. Total right knee arthroplasty appears intact and in anatomic alignment. 3. Moderate suprapatellar and joint effusion concerning for an infectious process.  Clinical correlation is recommended. Electronically Signed   By: Anner Crete M.D.   On: 11/16/2018 20:01   [place] Ekg Site Rite  Result Date: 11/23/2018 If Site Rite image not attached, placement could not be confirmed due to current cardiac rhythm.  [place] Ekg Site Rite  Result Date: 11/22/2018 If Site Rite image not attached, placement could not be confirmed due to current cardiac rhythm.   (Echo, Carotid, EGD, Colonoscopy, ERCP)    Subjective: Patient feels well today and his constipation is resolved.  He is excited about being discharged home.  Discharge Exam: Vitals:   11/30/18 2126 12/01/18 0425  BP: 106/65 120/68  Pulse: 75 85  Resp: 18 18  Temp: 98.1 F (36.7 C) 98.4 F (36.9 C)  SpO2: 100% 97%   Vitals:   11/30/18 0515 11/30/18 1500 11/30/18 2126 12/01/18 0425  BP: 121/71 118/69 106/65 120/68  Pulse: 83 79 75 85  Resp: 17 18 18 18   Temp: 98.8 F (37.1 C) 98.6 F (37 C) 98.1 F (36.7 C) 98.4 F (36.9 C)  TempSrc: Oral Oral Oral Oral  SpO2: 99% 100% 100% 97%  Weight:      Height:        General exam:Appears comfortable  HEENT:PERRLA, oral mucosa moist, no sclera icterus or thrush Respiratory system: Clear to auscultation. Respiratory effort normal. Cardiovascular system:S1 &S2  heard, No murmurs  Gastrointestinal system:Abdomen soft, non-tender, nondistended. Normal bowel sounds   Central nervous system:Alert and oriented. No focal neurological deficits. Extremities: No cyanosis, clubbing - right knee in dressing Psychiatry:  Mood & affect appropriate.     The results of significant diagnostics from this hospitalization (including imaging, microbiology, ancillary and laboratory) are listed below for reference.     Microbiology: Recent Results (from the past 240 hour(s))  Culture, blood (routine x 2)     Status: None   Collection Time: 11/22/18  4:34 PM   Specimen: BLOOD  Result Value Ref Range Status   Specimen Description BLOOD LEFT ANTECUBITAL  Final   Special Requests   Final    BOTTLES DRAWN AEROBIC AND ANAEROBIC Blood Culture adequate volume   Culture   Final    NO GROWTH 5 DAYS Performed at [facility], [address], [place], [place] [address]    Report Status 11/27/2018 FINAL  Final  Culture, blood (routine x 2)     Status: None   Collection Time: 11/22/18  4:35 PM   Specimen: BLOOD LEFT HAND  Result Value Ref Range Status   Specimen Description BLOOD LEFT HAND  Final   Special Requests   Final    BOTTLES DRAWN AEROBIC AND ANAEROBIC Blood Culture adequate volume   Culture   Final    NO GROWTH 5 DAYS Performed at [facility], [place] [address] [place], [place] [address]    Report Status 11/27/2018 FINAL  Final  Aerobic/Anaerobic Culture (surgical/deep wound)     Status: None   Collection Time: 11/23/18 11:18 AM   Specimen: PATH Cytology Misc. fluid; Body Fluid  Result Value Ref Range Status   Specimen Description TISSUE SYNOVIAL LEFT KNEE  Final   Special Requests NONE  Final   Gram Stain   Final    FEW WBC PRESENT, PREDOMINANTLY PMN NO ORGANISMS SEEN    Culture   Final    RARE STAPHYLOCOCCUS AUREUS CRITICAL RESULT CALLED TO, READ BACK BY AND VERIFIED WITH: N. THOMPSON RN, AT 1053 11/25/18 BY D. VANHOOK REGARDING  CULTURE GROWTH NO  ANAEROBES ISOLATED Performed at [facility], [place] [address] [place], [place] [address]    Report Status 11/28/2018 FINAL  Final   Organism ID, Bacteria STAPHYLOCOCCUS AUREUS  Final      Susceptibility   Staphylococcus aureus - MIC*    CIPROFLOXACIN >=8 RESISTANT Resistant     ERYTHROMYCIN >=8 RESISTANT Resistant     GENTAMICIN <=0.5 SENSITIVE Sensitive     OXACILLIN >=4 RESISTANT Resistant     TETRACYCLINE >=16 RESISTANT Resistant     VANCOMYCIN <=0.5 SENSITIVE Sensitive     TRIMETH/SULFA >=320 RESISTANT Resistant     CLINDAMYCIN RESISTANT Resistant     RIFAMPIN <=0.5 SENSITIVE Sensitive     Inducible Clindamycin POSITIVE Resistant     * RARE STAPHYLOCOCCUS AUREUS     Labs: BNP (last 3 results) No results for input(s): BNP in the last 8760 hours. Basic Metabolic Panel: Recent Labs  Lab 11/25/18 0400 11/26/18 0411 11/27/18 0212 11/28/18 0338 11/28/18 1318 11/29/18 1449 11/30/18 0424 12/01/18 0430  NA 142 133* 128* 116* 116* 116* 125* 132*  K 3.7 4.2 3.7 3.6  --  3.5 3.6 3.8  CL 106 99 94* 82*  --  84* 93* 102  CO2 28 26 24 22   --  22 22 22   GLUCOSE 105* 157* 239* 202*  --  98 136* 123*  BUN 6 12 11 8   --  12 13 9   CREATININE 0.88 0.97 0.70 0.63  --  0.61 0.74 0.77  CALCIUM 8.4* 8.5* 8.5* 8.4*  --  8.1* 8.2* 8.2*  MG 1.8 2.0 1.7 1.8  --   --   --   --   PHOS  --  4.1  --  2.3*  --   --   --   --    Liver Function Tests: Recent Labs  Lab 11/26/18 0411  AST 30  ALT 33  ALKPHOS 160*  BILITOT 0.5  PROT 6.2*  ALBUMIN 2.1*   No results for input(s): LIPASE, AMYLASE in the last 168 hours. No results for input(s): AMMONIA in the last 168 hours. CBC: Recent Labs  Lab 11/25/18 0400 11/26/18 1717 11/27/18 0212 11/27/18 1127 11/28/18 0338  WBC 4.7 17.7* 19.2* 21.0* 20.3*  HGB 9.7* 6.6* 7.6* 9.6* 9.7*  HCT 30.9* 20.2* 23.4* 28.8* 28.8*  MCV 84.9 79.5* 82.7 81.1 80.4  PLT 160 575* 557* 600* 498*   Cardiac Enzymes: No results for  input(s): CKTOTAL, CKMB, CKMBINDEX, TROPONINI in the last 168 hours. BNP: Invalid input(s): POCBNP CBG: Recent Labs  Lab 11/30/18 1135 11/30/18 1728 11/30/18 2123 12/01/18 0827 12/01/18 1222  GLUCAP 243* 122* 198* 191* 138*   D-Dimer No results for input(s): DDIMER in the last 72 hours. Hgb A1c No results for input(s): HGBA1C in the last 72 hours. Lipid Profile No results for input(s): CHOL, HDL, LDLCALC, TRIG, CHOLHDL, LDLDIRECT in the last 72 hours. Thyroid function studies No results for input(s): TSH, T4TOTAL, T3FREE, THYROIDAB in the last 72 hours.  Invalid input(s): FREET3 Anemia work up No results for input(s): VITAMINB12, FOLATE, FERRITIN, TIBC, IRON, RETICCTPCT in the last 72 hours. Urinalysis    Component Value Date/Time   COLORURINE STRAW (A) 10/31/2018 1928   APPEARANCEUR CLEAR 10/31/2018 1928   LABSPEC 1.006 10/31/2018 1928   PHURINE 5.0 10/31/2018 1928   GLUCOSEU 50 (A) 10/31/2018 1928   HGBUR SMALL (A) 10/31/2018 1928   BILIRUBINUR NEGATIVE 10/31/2018 1928   KETONESUR 20 (A) 10/31/2018 [place] NEGATIVE 10/31/2018 1928  UROBILINOGEN 0.2 12/18/2013 1402   NITRITE NEGATIVE 10/31/2018 1928   LEUKOCYTESUR NEGATIVE 10/31/2018 1928   Sepsis Labs Invalid input(s): PROCALCITONIN,  WBC,  LACTICIDVEN Microbiology Recent Results (from the past 240 hour(s))  Culture, blood (routine x 2)     Status: None   Collection Time: 11/22/18  4:34 PM   Specimen: BLOOD  Result Value Ref Range Status   Specimen Description BLOOD LEFT ANTECUBITAL  Final   Special Requests   Final    BOTTLES DRAWN AEROBIC AND ANAEROBIC Blood Culture adequate volume   Culture   Final    NO GROWTH 5 DAYS Performed at [facility], [address] [place], [place] [address]    Report Status 11/27/2018 FINAL  Final  Culture, blood (routine x 2)     Status: None   Collection Time: 11/22/18  4:35 PM   Specimen: BLOOD LEFT HAND  Result Value Ref Range Status   Specimen  Description BLOOD LEFT HAND  Final   Special Requests   Final    BOTTLES DRAWN AEROBIC AND ANAEROBIC Blood Culture adequate volume   Culture   Final    NO GROWTH 5 DAYS Performed at [facility], [place] [address], [place], [place] [address]    Report Status 11/27/2018 FINAL  Final  Aerobic/Anaerobic Culture (surgical/deep wound)     Status: None   Collection Time: 11/23/18 11:18 AM   Specimen: PATH Cytology Misc. fluid; Body Fluid  Result Value Ref Range Status   Specimen Description TISSUE SYNOVIAL LEFT KNEE  Final   Special Requests NONE  Final   Gram Stain   Final    FEW WBC PRESENT, PREDOMINANTLY PMN NO ORGANISMS SEEN    Culture   Final    RARE STAPHYLOCOCCUS AUREUS CRITICAL RESULT CALLED TO, READ BACK BY AND VERIFIED WITH: Deno Lunger RN, AT 1053 11/25/18 BY D. VANHOOK REGARDING CULTURE GROWTH NO ANAEROBES ISOLATED Performed at [facility], [place] [address], [place],  [address]    Report Status 11/28/2018 FINAL  Final   Organism ID, Bacteria STAPHYLOCOCCUS AUREUS  Final      Susceptibility   Staphylococcus aureus - MIC*    CIPROFLOXACIN >=8 RESISTANT Resistant     ERYTHROMYCIN >=8 RESISTANT Resistant     GENTAMICIN <=0.5 SENSITIVE Sensitive     OXACILLIN >=4 RESISTANT Resistant     TETRACYCLINE >=16 RESISTANT Resistant     VANCOMYCIN <=0.5 SENSITIVE Sensitive     TRIMETH/SULFA >=320 RESISTANT Resistant     CLINDAMYCIN RESISTANT Resistant     RIFAMPIN <=0.5 SENSITIVE Sensitive     Inducible Clindamycin POSITIVE Resistant     * RARE STAPHYLOCOCCUS AUREUS     Time coordinating discharge: Over 30 minutes  SIGNED:   Charolotte Capuchin, MD  [facility] 12/01/2018, 4:29 PM   If 7PM-7AM, please contact night-coverage [email or web address] Password [email or web address]

## 2018-12-01 NOTE — Progress Notes (Signed)
Pharmacy Antibiotic Note  [name] is a 53 y.o. male admitted on 11/16/2018 with septic R knee, MRSA bacteremia.  Pharmacy has been consulted for Vancomcyin dosing.  ID: septic R knee, MRSA bacteremia +PICC (10/31 negative for cocaine)  - Has some small healing foot ulcers - 11/4 s/p removal of hardware and placement of antibiotic spacer by Ortho.   Afb, WBC up 20.3 on 11/9  10/29 Vancomycin x1,  10/31>>(12/15) 10/28 Cefepime >> 10/29 10/29 Cefazolin >> 11/1  10/13 MRSA PCR positive 10/29 MRSA PCR neg 10/28 BCx: MRSA 10/28 Synovial fluid: MRSA 10/30 AFB neg, Cx sent 10/30 wound cx MRSA  Vancomycin 1500 mg IV Q 12 hrs. Goal AUC 400-550. Expected AUC: 507.7 SCr used: 0.84  11/5: Trough 22 (peak did not get drawn) 11/9 pm:  VT: 14 mg/L; VP : 30 mg/L; vanc AUC calculated 576.1, decrease vancomycin regimen to 1 gm IV Q 12 hrs, New estimated vanc AUC on this regimen is 460.6    Plan: Continue Vanc 1gm every 12 hours, OPAT updated 11/10 Vanc x 6 wks, thru 01/03/19 Weekly levels due by 11/16 Possibly home 11/12.    Height: 6\' 3"  (190.5 cm) Weight: 175 lb 11.3 oz (79.7 kg) IBW/kg (Calculated) : 84.5  Temp (24hrs), Avg:98.4 F (36.9 C), Min:98.1 F (36.7 C), Max:98.6 F (37 C)  Recent Labs  Lab 11/25/18 0400  11/26/18 1717 11/27/18 0212 11/27/18 1127 11/28/18 0338 11/28/18 1554 11/28/18 2011 11/29/18 1449 11/30/18 0424 12/01/18 0430  WBC 4.7  --  17.7* 19.2* 21.0* 20.3*  --   --   --   --   --   CREATININE 0.88   < >  --  0.70  --  0.63  --   --  0.61 0.74 0.77  VANCOTROUGH  --   --  18  --   --   --  14*  --   --   --   --   VANCOPEAK  --   --   --   --   --   --  14* 30  --   --   --    < > = values in this interval not displayed.    Estimated Creatinine Clearance: 117.6 mL/min (by C-G formula based on SCr of 0.77 mg/dL).    Allergies  Allergen Reactions  . Benadryl [Diphenhydramine Hcl] Other (See Comments)    Leg spasms   . Trazodone And Nefazodone  Other (See Comments)    Leg spasms      Kacy Hegna S. Alford Highland, PharmD, BCPS Clinical Staff Pharmacist [facility] Gh[facility] 12/01/2018 10:53 AM

## 2018-12-02 ENCOUNTER — Telehealth: Payer: Self-pay | Admitting: Orthopedic Surgery

## 2018-12-02 NOTE — Telephone Encounter (Signed)
[name] for [facility] called requesting VO for [facility] SN for the following:  2x a week for PICC line and Labs.    The dressing over his incision came off, but incision is closed.  Okay to leave open to the air  Okay if patient uses ice and compression for the swelling.  CB#[phone].  Thank you.

## 2018-12-03 IMAGING — DX DG HIP (WITH OR WITHOUT PELVIS) 2-3V*R*
3 series · 3 of 3 positions shown · non-contrast
Comparison: None.

CLINICAL DATA: Right hip pain after fall.

EXAM:
DG HIP (WITH OR WITHOUT PELVIS) 2-3V RIGHT

[pelvis ap]
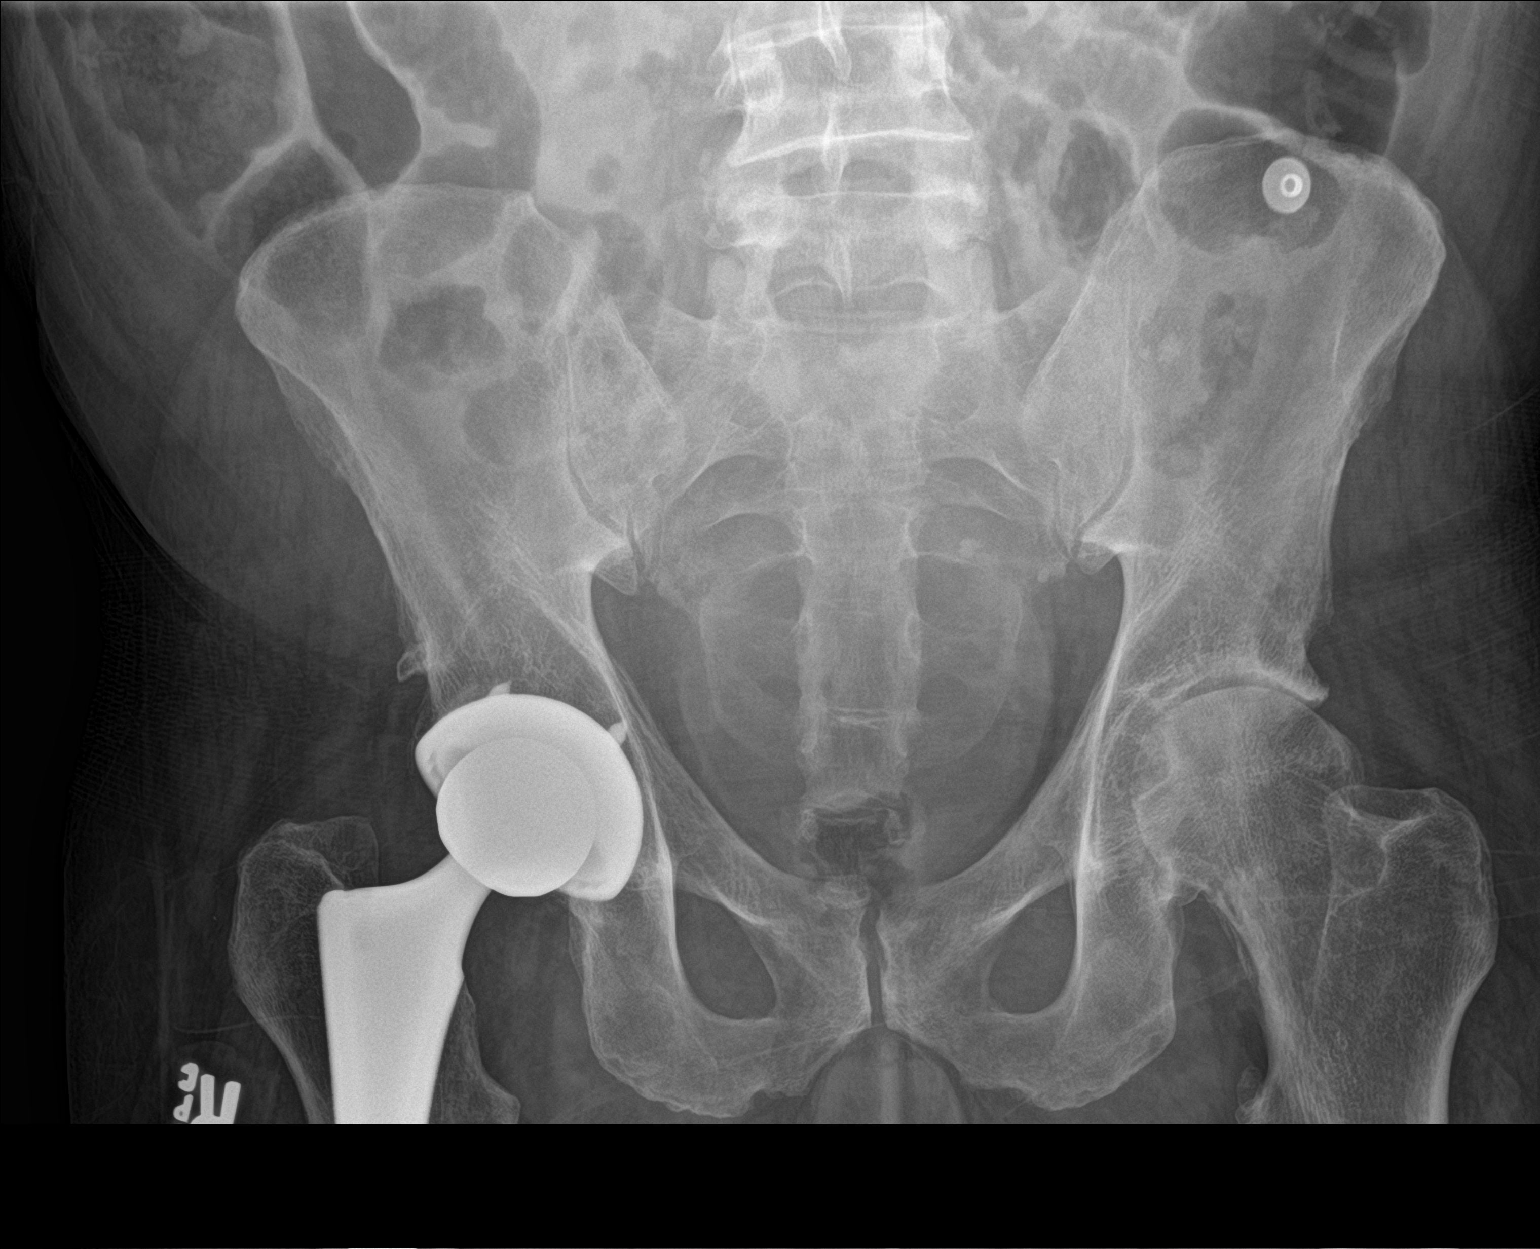

[hip ap]
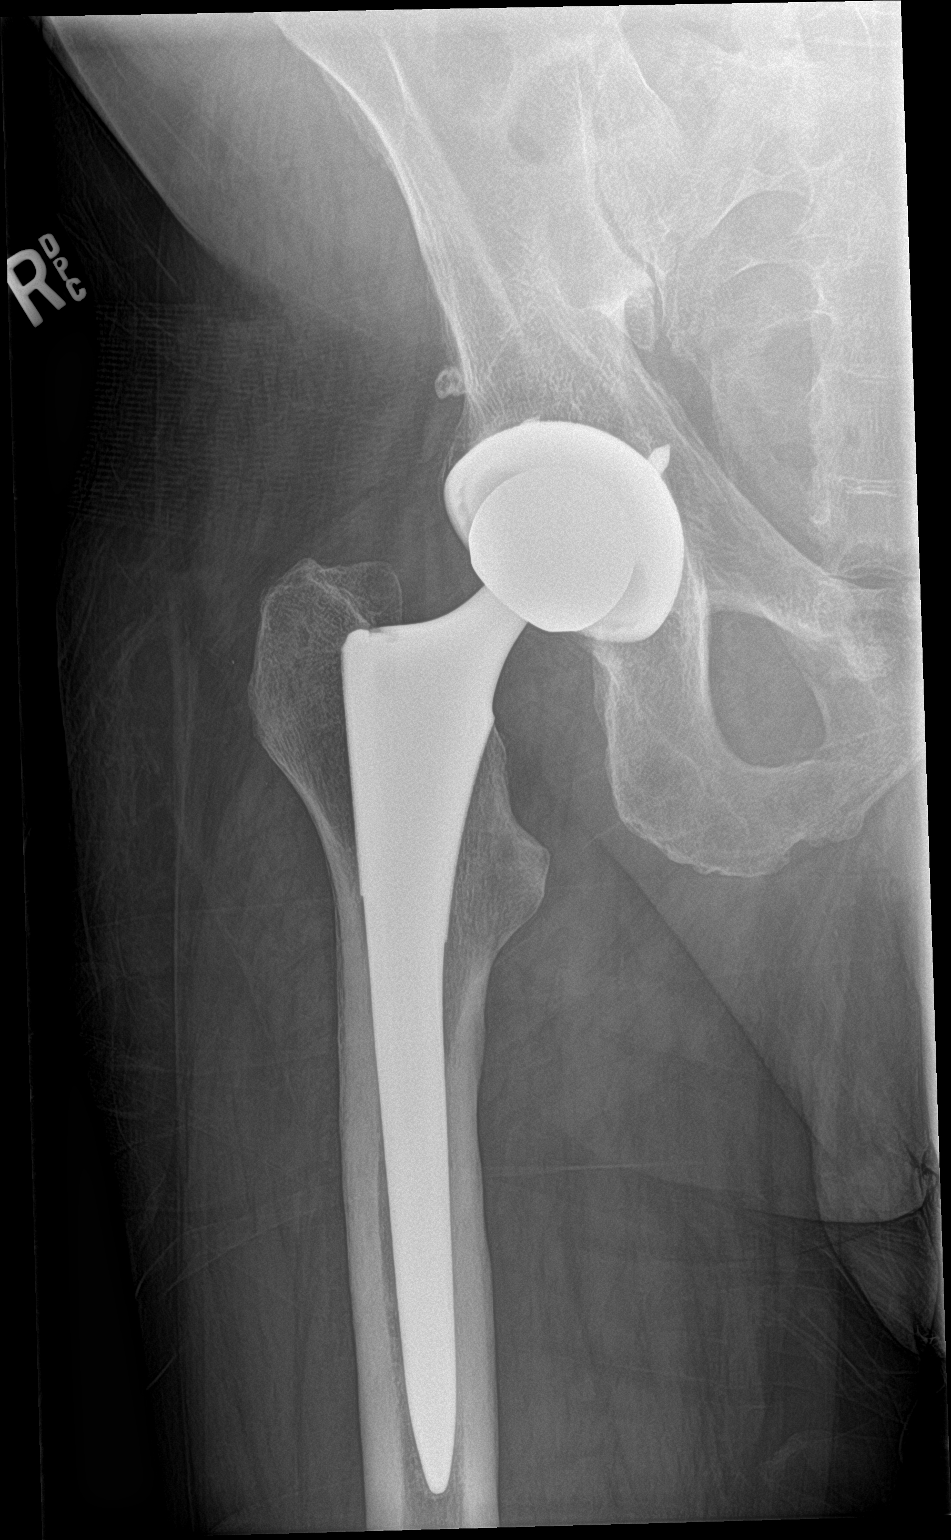

[hip lat]
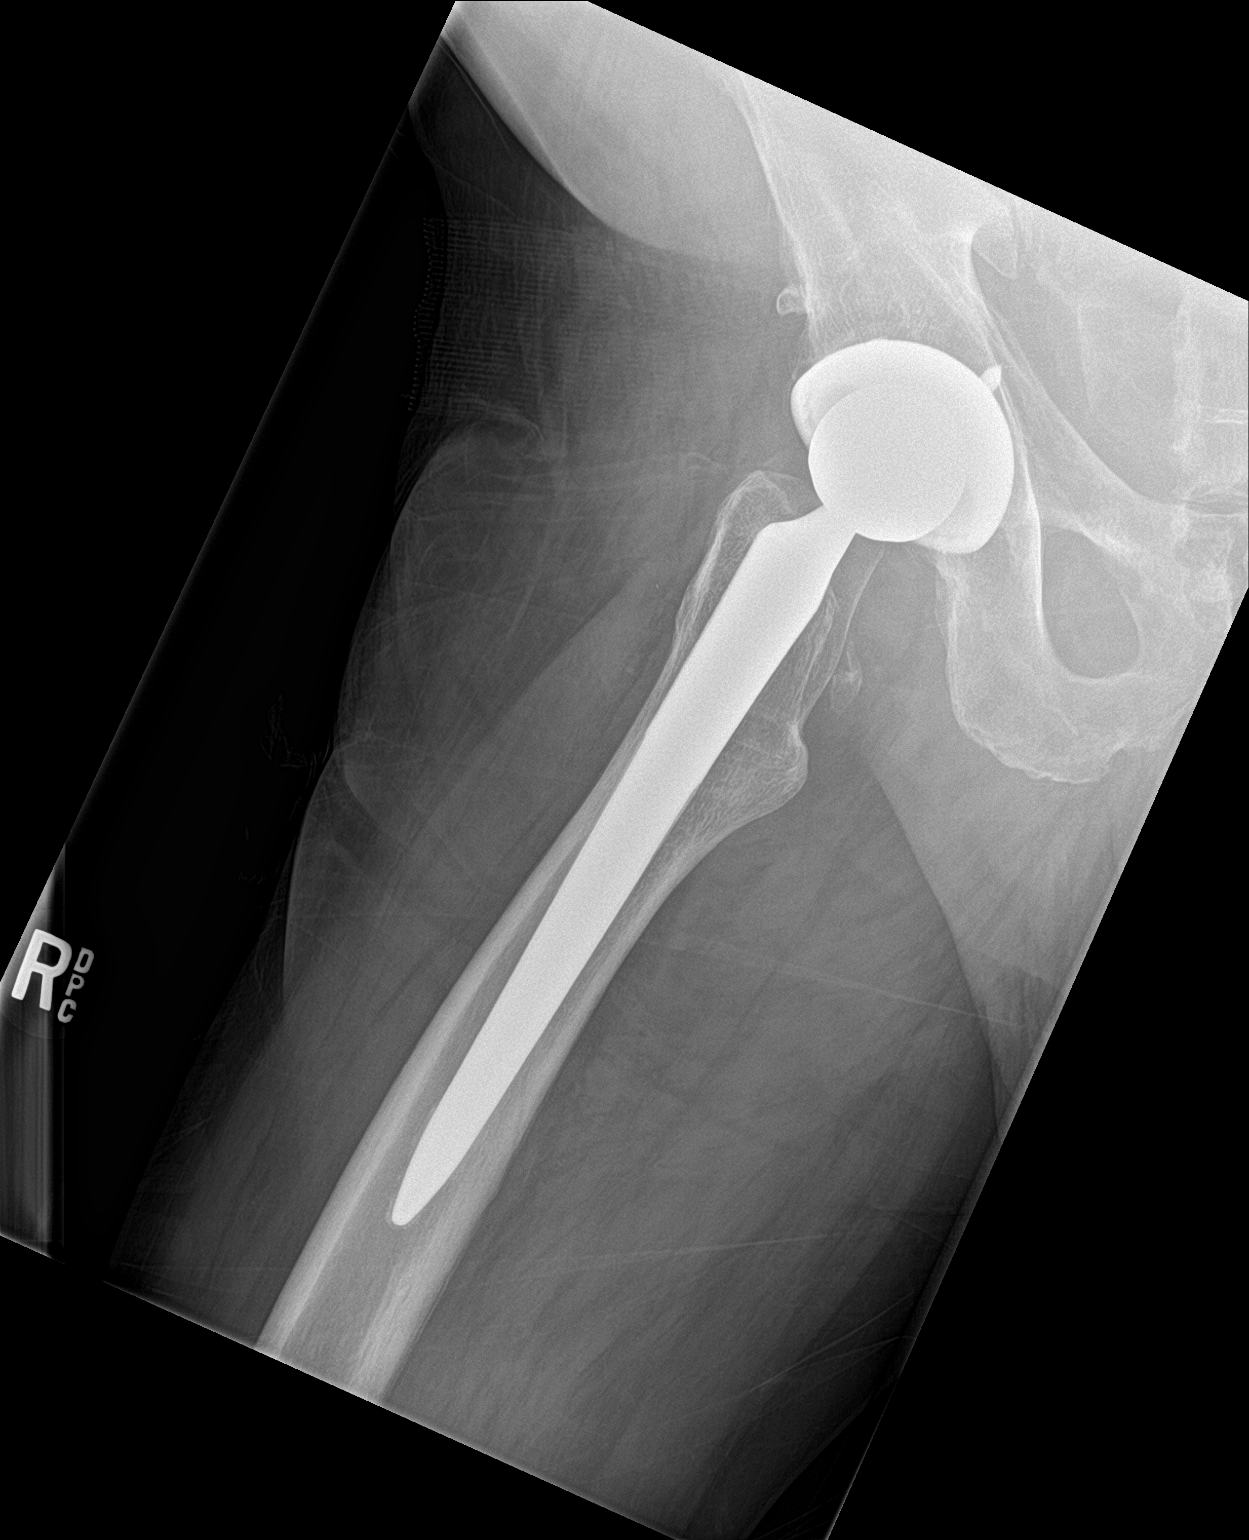

[3 of 3 positions shown; findings below may reference images not displayed]

FINDINGS: Right hip total arthroplasty in expected alignment. No
periprosthetic lucency or fracture. The remainder the bony pelvis is
intact. Pubic rami are intact. Pubic symphysis and sacroiliac joints
are congruent. There is osteoarthritis of the left hip.
IMPRESSION: Right hip arthroplasty without complication or periprosthetic
fracture.

## 2018-12-03 IMAGING — DX DG SHOULDER 2+V*R*
3 series · 3 of 3 positions shown · non-contrast
Comparison: None.

CLINICAL DATA: Right shoulder pain after fall.

EXAM:
RIGHT SHOULDER - 2+ VIEW

[shoulder grashey]
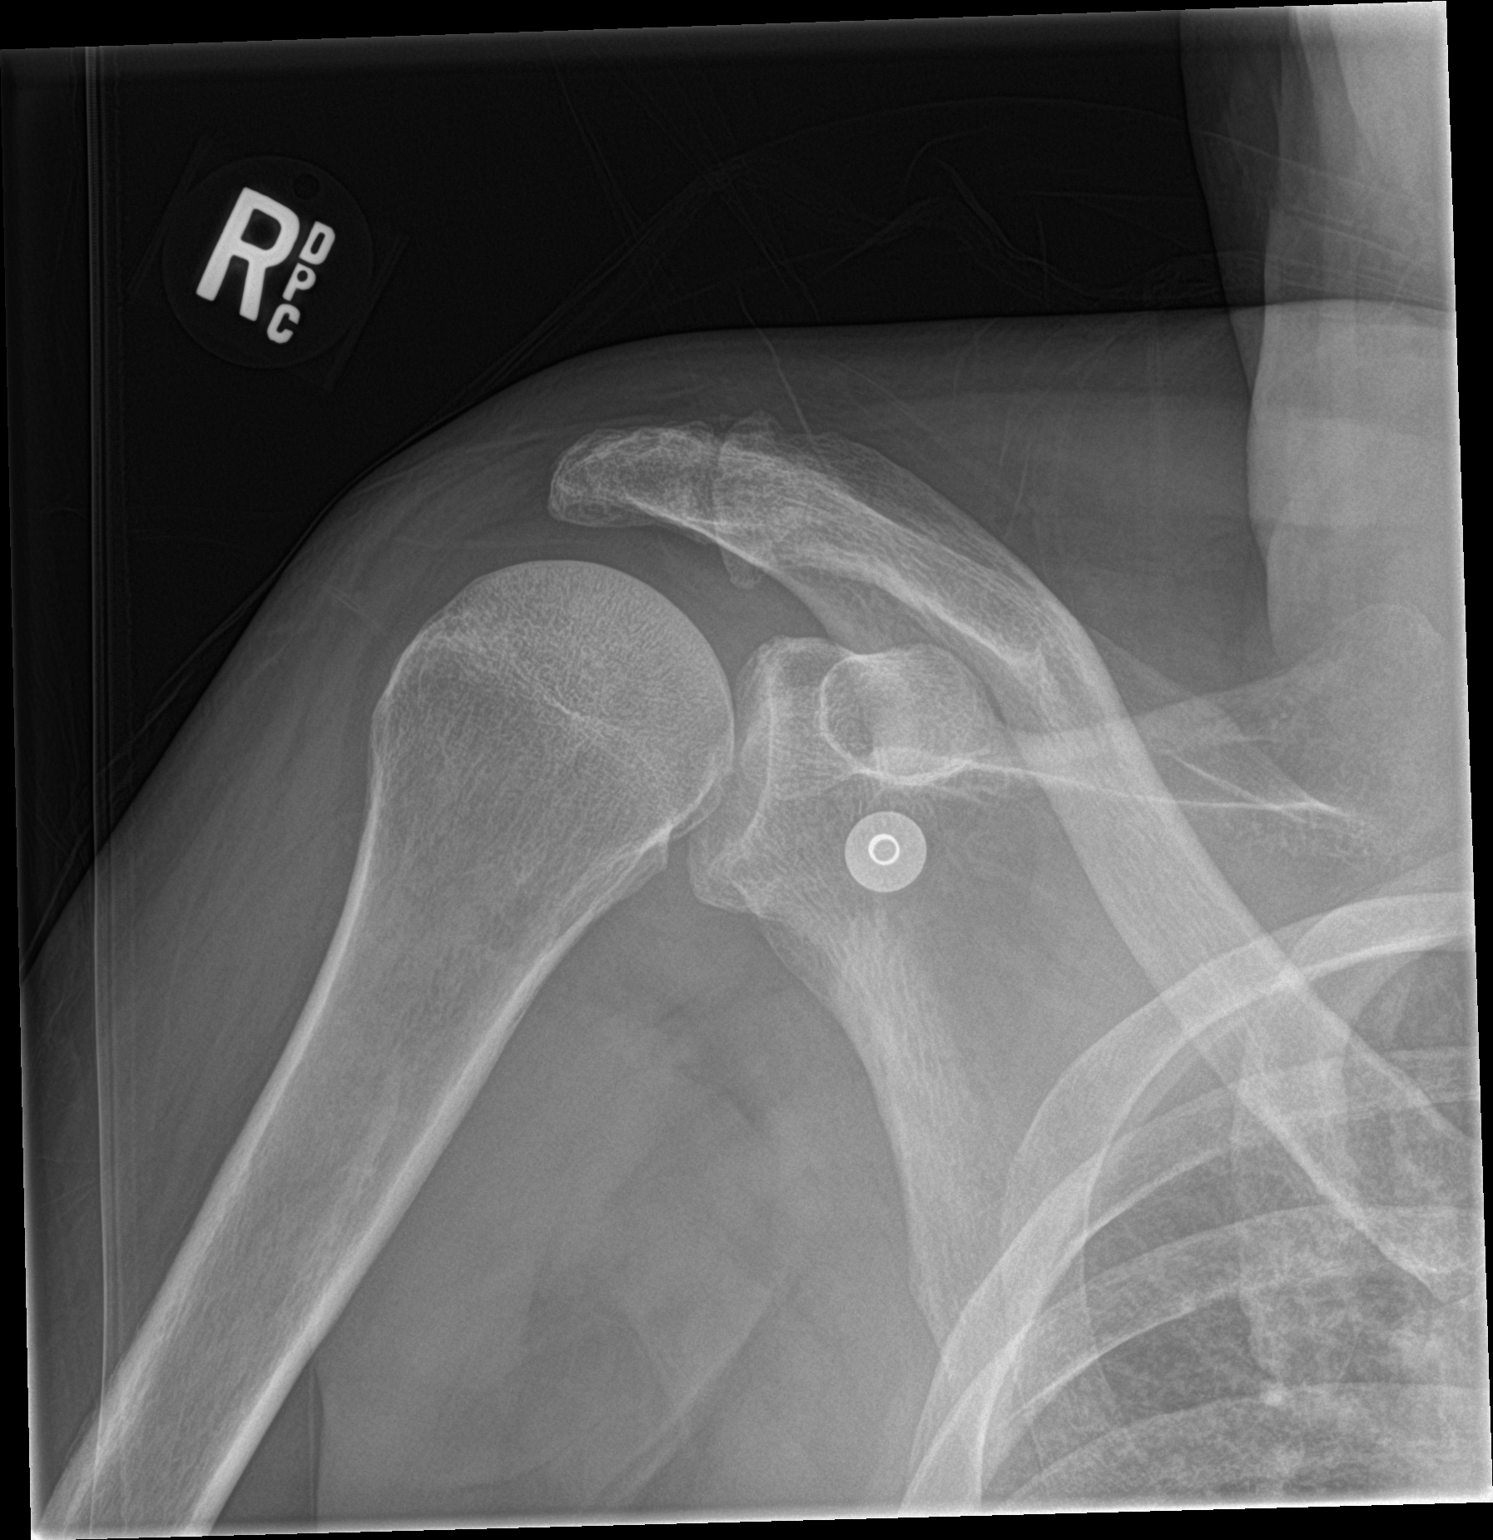

[shoulder y view]
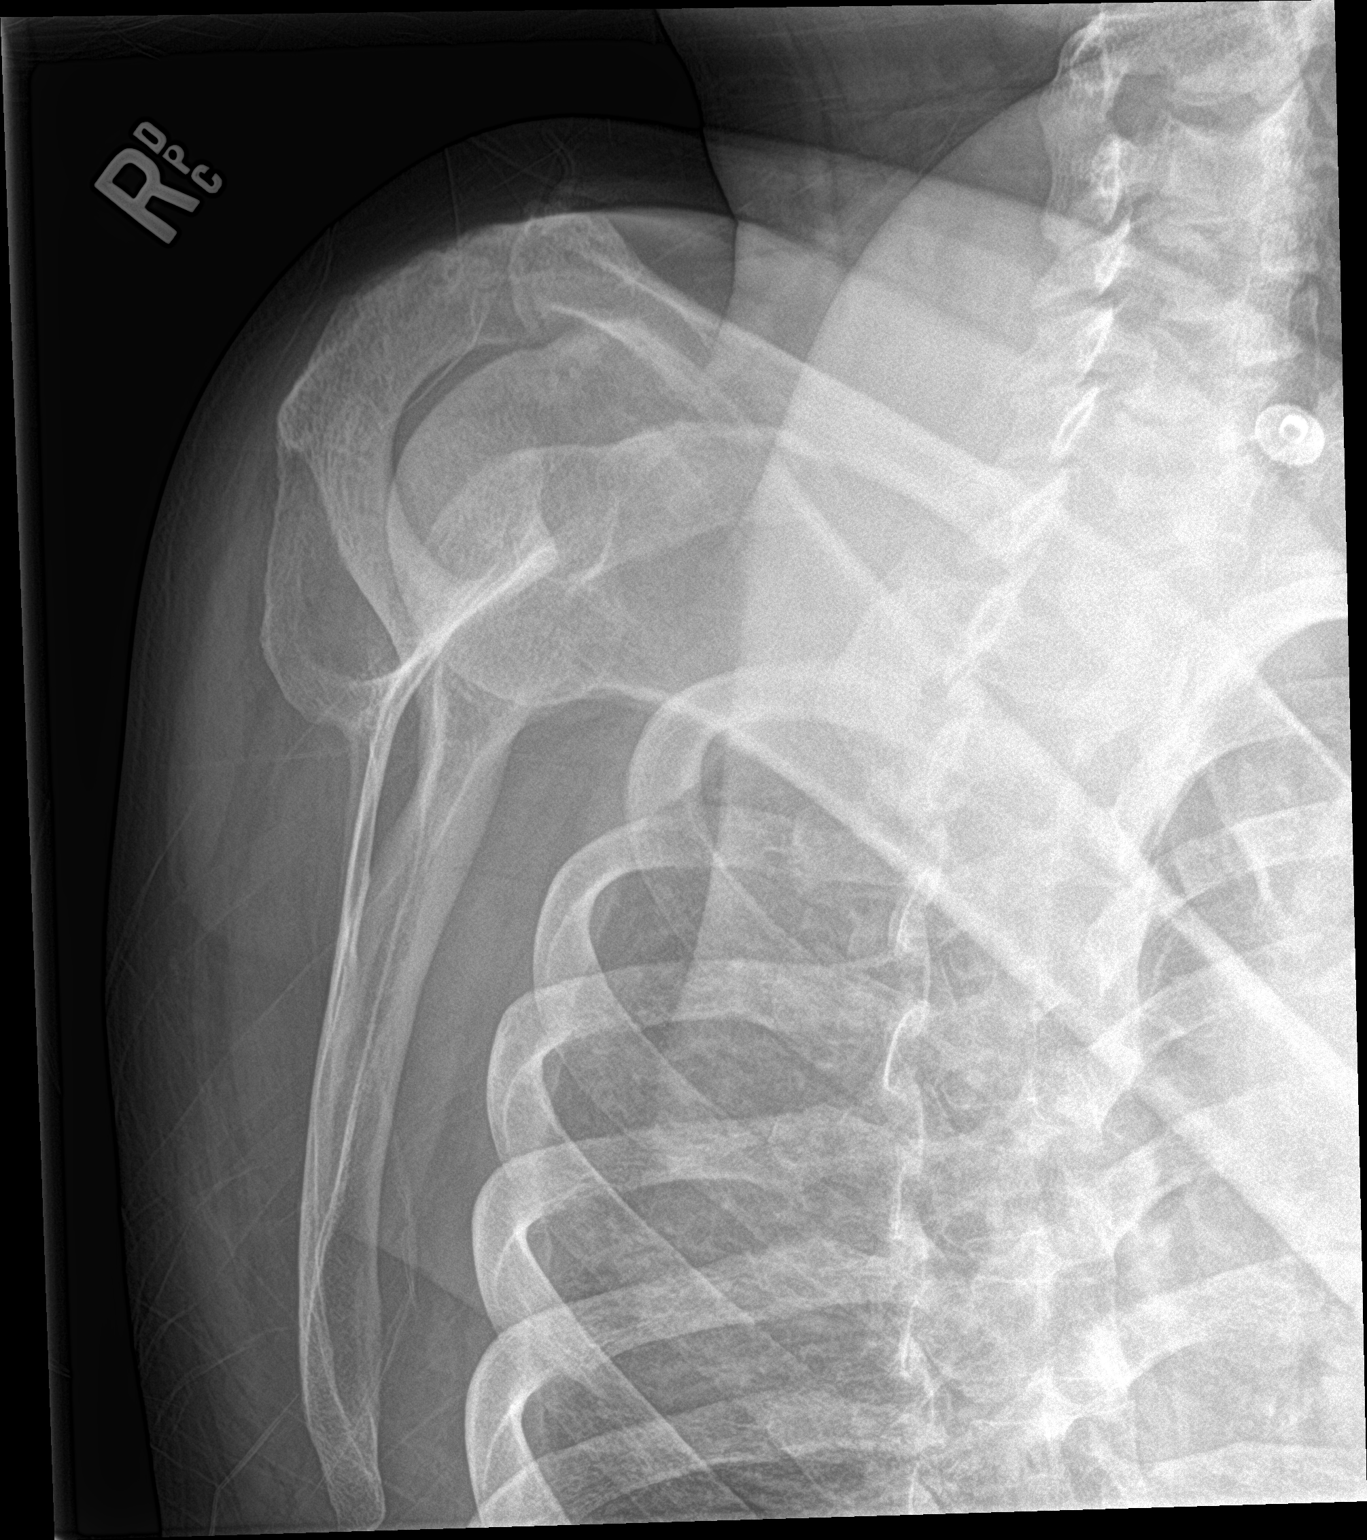

[shoulder axillary]
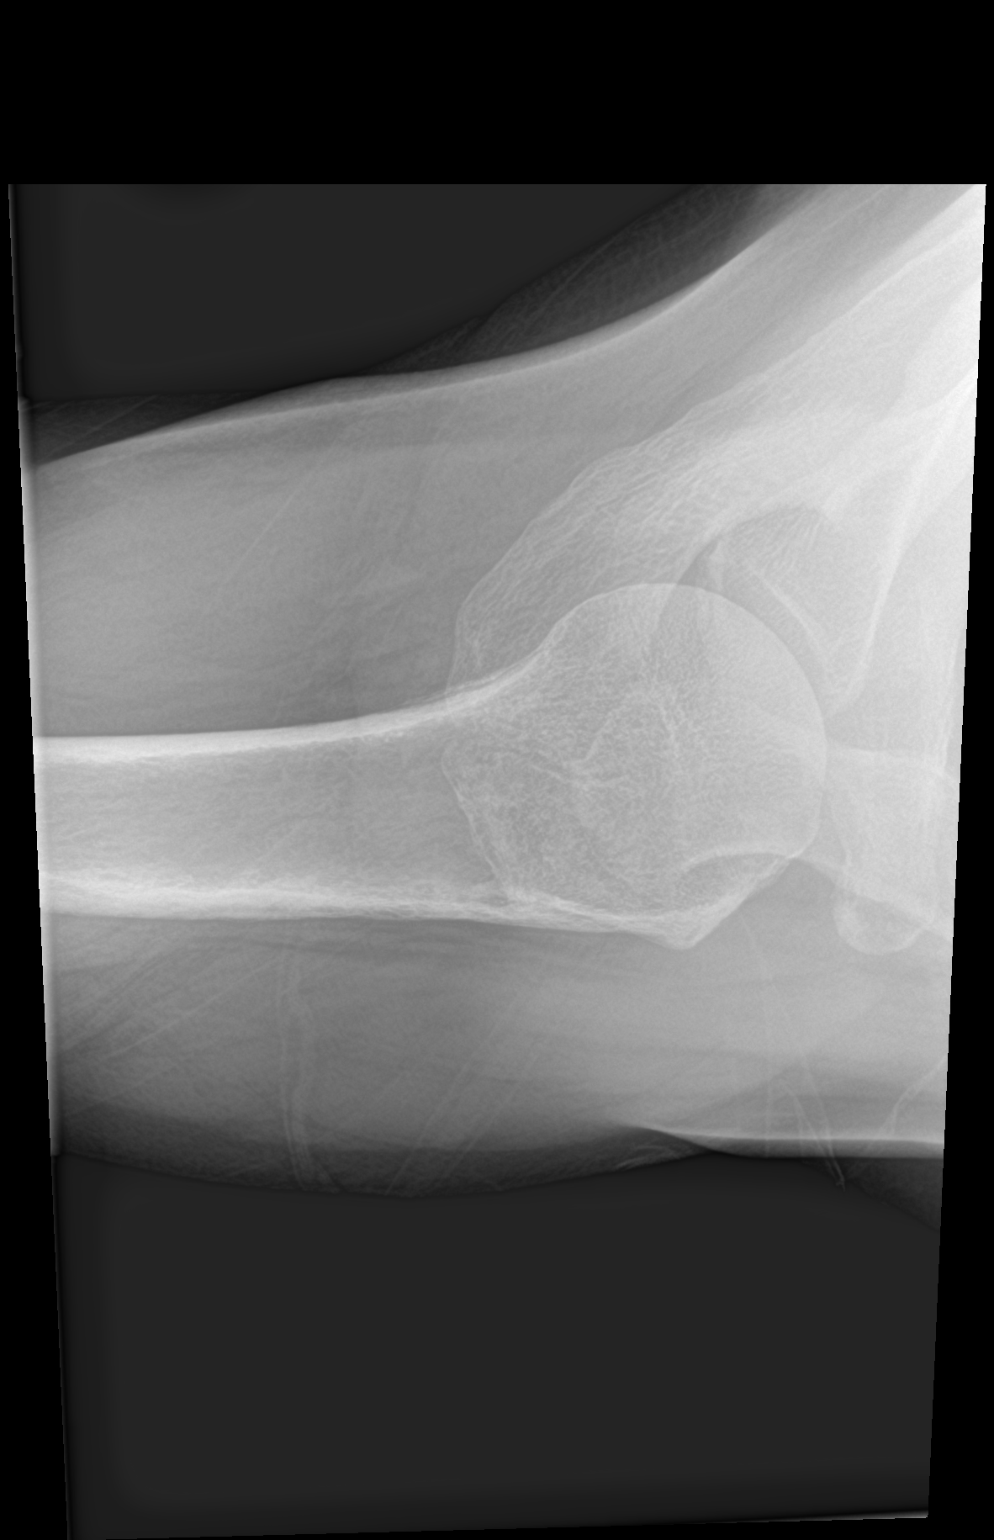

[3 of 3 positions shown; findings below may reference images not displayed]

FINDINGS: There is no evidence of fracture or dislocation. Mild degenerative
change of the acromioclavicular joint. Soft tissues are
unremarkable.
IMPRESSION: No fracture or dislocation of the right shoulder.

## 2018-12-05 ENCOUNTER — Telehealth: Payer: Self-pay | Admitting: Orthopedic Surgery

## 2018-12-05 NOTE — Telephone Encounter (Signed)
Tressia Danas called with [place]. He went for evaluation of patient and states that he is limited on what he can do due to recent infection and spacer. He would like orders to do 2X/week for 2 weeks and then 1X/week for 1 week. He will be working on safety and positioning.  Call back [phone]

## 2018-12-05 NOTE — Telephone Encounter (Signed)
[name] was called and lvm for okay for [facility] SN orders as follows below.

## 2018-12-05 NOTE — Telephone Encounter (Signed)
[name] was called and given verbal okay for PT orders and he understood.

## 2018-12-06 ENCOUNTER — Encounter: Payer: Self-pay | Admitting: Internal Medicine

## 2018-12-08 ENCOUNTER — Encounter: Payer: Self-pay | Admitting: Physician Assistant

## 2018-12-08 ENCOUNTER — Ambulatory Visit (INDEPENDENT_AMBULATORY_CARE_PROVIDER_SITE_OTHER): Payer: Medicare Other | Admitting: Physician Assistant

## 2018-12-08 ENCOUNTER — Other Ambulatory Visit: Payer: Self-pay

## 2018-12-08 VITALS — Ht 75.0 in | Wt 175.0 lb

## 2018-12-08 DIAGNOSIS — G8929 Other chronic pain: Secondary | ICD-10-CM | POA: Diagnosis not present

## 2018-12-08 DIAGNOSIS — M25561 Pain in right knee: Secondary | ICD-10-CM | POA: Diagnosis not present

## 2018-12-08 MED ORDER — OXYCODONE-ACETAMINOPHEN 5-325 MG PO TABS: 1.0000 | ORAL_TABLET | Freq: Four times a day (QID) | ORAL | 0 refills | Status: DC | PRN

## 2018-12-08 NOTE — Progress Notes (Signed)
Office Visit Note   Patient: [name]           Date of Birth: 12-Jul-1965           MRN: [id number] Visit Date: 12/08/2018              Requested by: Lucia Gaskins, MD [address] [place],  [place] [address] PCP: Lucia Gaskins, MD  Chief Complaint  Patient presents with  . Right Knee - Routine Post Op    11/23/18 removal TKA and placement of ABX spacer.       HPI: Patient is 2 weeks s/p removal of R TKA and placement of antibiotic spacer. He is doing well and receiving IV antibiotics. He denies fever chills or calf pain. Also requesting to have feet examined. He is s/p TMA on right and has a history of an ulcer on the plantar surface of the 1st metatarsal head.   Assessment & Plan: Visit Diagnoses: No diagnosis found.  Plan: Follow up 1 week for removal of remaining staples. Also will obtain XL Vive socks  Follow-Up Instructions: No follow-ups on file.   Ortho Exam  Patient is alert, oriented, no adenopathy, well-dressed, normal affect, normal respiratory effort. L Knee: Well approximated surgical incision. Distal 1/3 of incision is still healing . Mild erythema in this area but no swelling, drainage or cellulitis. Compartments soft non tender. Distal CMS is intact Left foot: Healing ulcer beneath 1st metatarsal head. No foul odor or drainage with thick callus. The callus was trimmed . Ulcer does not probe deep Right foot: s/p TMA with eschar and callus which was debrided to healthy skin.   Imaging: No results found. No images are attached to the encounter.  Labs: Lab Results  Component Value Date   HGBA1C 6.4 (H) 10/31/2018   HGBA1C 6.2 (H) 06/13/2018   HGBA1C 6.7 (H) 06/07/2017   ESRSEDRATE 120 (H) 11/16/2018   ESRSEDRATE 30 (H) 06/13/2018   ESRSEDRATE 5 06/04/2017   CRP 36 (H) 11/16/2018   CRP 5.7 (H) 06/13/2018   CRP <0.8 06/05/2017   REPTSTATUS 11/28/2018 FINAL 11/23/2018   GRAMSTAIN  11/23/2018    FEW WBC PRESENT, PREDOMINANTLY PMN NO  ORGANISMS SEEN    CULT  11/23/2018    RARE STAPHYLOCOCCUS AUREUS CRITICAL RESULT CALLED TO, READ BACK BY AND VERIFIED WITH: Deno Lunger RN, AT 1053 11/25/18 BY D. VANHOOK REGARDING CULTURE GROWTH NO ANAEROBES ISOLATED Performed at [facility], [place] [address] [place], [place] [address]    LABORGA STAPHYLOCOCCUS AUREUS 11/23/2018     Lab Results  Component Value Date   ALBUMIN 2.1 (L) 11/26/2018   ALBUMIN 3.1 (L) 11/16/2018   ALBUMIN 3.9 10/31/2018    Lab Results  Component Value Date   MG 1.8 11/28/2018   MG 1.7 11/27/2018   MG 2.0 11/26/2018   No results found for: VD25OH  No results found for: PREALBUMIN CBC EXTENDED Latest Ref Rng & Units 11/28/2018 11/27/2018 11/27/2018  WBC 4.0 - 10.5 K/uL 20.3(H) 21.0(H) 19.2(H)  RBC 4.22 - 5.81 MIL/uL 3.58(L) 3.55(L) 2.83(L)  HGB 13.0 - 17.0 g/dL 9.7(L) 9.6(L) 7.6(L)  HCT 39.0 - 52.0 % 28.8(L) 28.8(L) 23.4(L)  PLT 150 - 400 K/uL 498(H) 600(H) 557(H)  NEUTROABS 1.7 - 7.7 K/uL - - -  LYMPHSABS 0.7 - 4.0 K/uL - - -     Body mass index is 21.87 kg/m.  Orders:  No orders of the defined types were placed in this encounter.  Meds ordered this  encounter  Medications  . oxyCODONE-acetaminophen (PERCOCET/ROXICET) 5-325 MG tablet    Sig: Take 1 tablet by mouth every 6 (six) hours as needed for severe pain.    Dispense:  30 tablet    Refill:  0     Procedures: No procedures performed  Clinical Data: No additional findings.  ROS:  All other systems negative, except as noted in the HPI. Review of Systems  Objective: Vital Signs: Ht 6\' 3"  (1.905 m)   Wt 175 lb (79.4 kg)   BMI 21.87 kg/m   Specialty Comments:  No specialty comments available.  PMFS History: Patient Active Problem List   Diagnosis Date Noted  . Sepsis ([place])   . Staphylococcus aureus bacteremia   . 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 Infected prosthetic knee joint, initial encounter ([place])   . Septic joint of right knee joint ([place])  11/16/2018  . Hypokalemia 11/16/2018  . Anemia 11/16/2018  . Acute encephalopathy 10/31/2018  . Accidental drug overdose 10/31/2018  . Left arm weakness 10/31/2018  . History of transmetatarsal amputation of right foot ([place]) 08/30/2018  . Charcot foot due to diabetes mellitus ([place])   . Cellulitis 06/12/2018  . Diabetic foot infection ([place]) 06/22/2017  . Post op infection 06/22/2017  . SVT (supraventricular tachycardia) ([place]) 08/17/2016  . Tobacco use disorder 10/25/2014  . Diabetes mellitus type 2 in nonobese (HCC)   . Swelling   . Type 2 diabetes mellitus with left diabetic foot ulcer ([place]) 12/18/2013  . Diabetes mellitus due to underlying condition with foot ulcer ([place]) 12/18/2013  . Left leg cellulitis 12/18/2013  . Cocaine abuse ([place]) 03/29/2012  . Generalized anxiety disorder 03/29/2012  . Panic attacks 03/29/2012  . Alcohol dependence ([place]) 08/05/2011    Class: Chronic  . Substance abuse ([place]) 05/31/2011  . Ulcer of left foot, limited to breakdown of skin ([place]) 02/15/2011   Past Medical History:  Diagnosis Date  . Anxiety   . Arthritis    "everywhere" (04/21/2012)  . Cellulitis and abscess of foot 12/19/2013   LEFT FOOT  . Cellulitis of right foot   . Chronic pain   . DDD (degenerative disc disease)   . Depression   . Diabetes mellitus without complication (HCC)    borderline  . Diabetic foot ulcer ([place])   . ETOH abuse   . Full dentures   . GERD (gastroesophageal reflux disease)    tums  . H/O amputation of lesser toe, right ([place]) 07/29/2017  . History of blood transfusion    "related to left knee OR; probably right hip too" (04/21/2012)  . Mental disorder   . Neuromuscular disorder (HCC)    neuropathy  . Noncompliance   . Open wound    bottom of foot  . Osteomyelitis ([place])    right 4th toe  . Osteomyelitis of right foot ([place])   . Pneumonia ~ 2012  . Polysubstance abuse (HCC)    etoh, cocaine, heroin  . Stroke [facility]) 2008   "they said I might have had one  during right hip replacement" (04/21/2012)  . Wears glasses     Family History  Problem Relation Age of Onset  . Diabetes Father     Past Surgical History:  Procedure Laterality Date  . AMPUTATION Right 06/09/2017   Procedure: RIGHT 2ND TOE AMPUTATION;  Surgeon: [name], MD;  Location: [place];  Service: Orthopedics;  Laterality: Right;  . AMPUTATION Right 07/23/2017   Procedure: RIGHT 4TH TOE AMPUTATION;  Surgeon:  [name], MD;  Location: [place];  Service: Orthopedics;  Laterality: Right;  . AMPUTATION Right 06/15/2018   Procedure: RIGHT FOOT TRANSMETATARSAL AMPUTATION;  Surgeon: [name], MD;  Location: [place];  Service: Orthopedics;  Laterality: Right;  right  . EXCISIONAL TOTAL KNEE ARTHROPLASTY WITH ANTIBIOTIC SPACERS Right 11/23/2018   Procedure: REMOVAL OF RIGHT TOTAL KNEE ARTHROPLASTY AND PLACEMENT OF ANATOMIC ANTIBIOTIC SPACER;  Surgeon: [name], MD;  Location: [place];  Service: Orthopedics;  Laterality: Right;  . I&D EXTREMITY Right 11/18/2018   Procedure: RIGHT KNEE DEBRIDEMENT;  Surgeon: [name], MD;  Location: [place];  Service: Orthopedics;  Laterality: Right;  . JOINT REPLACEMENT    . KNEE ARTHROSCOPY Bilateral 1980's/1990's  . LUNG LOBECTOMY Left ~ 2006  . LUNG LOBECTOMY    . METATARSAL OSTEOTOMY  10/29/2011   Procedure: METATARSAL OSTEOTOMY;  Surgeon: [name], MD;  Location: [facility];  Service: Orthopedics;  Laterality: Left;  Left 1st Metatarsal Dorsal Closing Wedge   . METATARSAL OSTEOTOMY Right 04/28/2017   Procedure: RIGHT 1ST METATARSAL DORSAL CLOSING WEDGE OSTEOTOMY;  Surgeon: [name], MD;  Location: [facility] [facility];  Service: Orthopedics;  Laterality: Right;  . MULTIPLE TOOTH EXTRACTIONS    . REVISION TOTAL HIP ARTHROPLASTY Right 2008   "4-5 months after replacement" (04/21/2012)  . TOTAL HIP ARTHROPLASTY Right 2008  . TOTAL KNEE ARTHROPLASTY Left 2006  . TOTAL KNEE ARTHROPLASTY Right 04/20/2012  . TOTAL KNEE ARTHROPLASTY Right 04/20/2012    Procedure: TOTAL KNEE ARTHROPLASTY;  Surgeon: [name], MD;  Location: [place];  Service: Orthopedics;  Laterality: Right;  Right Total Knee Arthroplasty   Social History   Occupational History  . Not on file  Tobacco Use  . Smoking status: Current Every Day Smoker    Packs/day: 1.00    Years: 30.00    Pack years: 30.00    Types: Cigarettes  . Smokeless tobacco: Never Used  Substance and Sexual Activity  . Alcohol use: Not Currently    Alcohol/week: 0.0 standard drinks    Frequency: Never  . Drug use: Not Currently    Types: Cocaine    Comment: Cocaine and heroin - 10 years ago  . Sexual activity: Yes    Birth control/protection: None

## 2018-12-12 ENCOUNTER — Inpatient Hospital Stay: Payer: Medicare Other | Admitting: Internal Medicine

## 2018-12-14 ENCOUNTER — Other Ambulatory Visit: Payer: Self-pay

## 2018-12-14 ENCOUNTER — Ambulatory Visit (INDEPENDENT_AMBULATORY_CARE_PROVIDER_SITE_OTHER): Payer: Medicare Other | Admitting: Family

## 2018-12-14 ENCOUNTER — Encounter: Payer: Self-pay | Admitting: Family

## 2018-12-14 ENCOUNTER — Telehealth: Payer: Self-pay | Admitting: Family

## 2018-12-14 VITALS — Ht 75.0 in | Wt 175.0 lb

## 2018-12-14 DIAGNOSIS — T8453XD Infection and inflammatory reaction due to internal right knee prosthesis, subsequent encounter: Secondary | ICD-10-CM

## 2018-12-14 DIAGNOSIS — L97521 Non-pressure chronic ulcer of other part of left foot limited to breakdown of skin: Secondary | ICD-10-CM

## 2018-12-14 DIAGNOSIS — Z89431 Acquired absence of right foot: Secondary | ICD-10-CM

## 2018-12-14 MED ORDER — OXYCODONE-ACETAMINOPHEN 5-325 MG PO TABS: 1.0000 | ORAL_TABLET | Freq: Four times a day (QID) | ORAL | 0 refills | Status: DC | PRN

## 2018-12-14 NOTE — Telephone Encounter (Signed)
I guess he'll have to either wait for the system to be up tomorrow or come back here for a paper rx

## 2018-12-14 NOTE — Addendum Note (Signed)
Addended by: Dondra Prader R on: 12/14/2018 12:44 PM   Modules accepted: Orders

## 2018-12-14 NOTE — Addendum Note (Signed)
Addended by: Dondra Prader R on: 12/14/2018 10:57 AM   Modules accepted: Orders

## 2018-12-14 NOTE — Telephone Encounter (Signed)
-----   Message from Suzan Slick, NP sent at 12/14/2018  9:52 AM EST ----- To get him in for emg of LUE, do I need to reorder  He said he was in the hospital and missed his appointment

## 2018-12-14 NOTE — Progress Notes (Signed)
Post-Op Visit Note   Patient: [name]           Date of Birth: 08/09/1965           MRN: [id number] Visit Date: 12/14/2018 PCP: Lucia Gaskins, MD  Chief Complaint:  Chief Complaint  Patient presents with  . Right Knee - Routine Post Op    11/23/18 right removal total knee replacement and placement of ABX spacer.     HPI:  HPI The patient is a 53 year old gentleman seen today status post removal of right total knee replacement and placement of an antibiotic spacer on November 4.  He has been full weightbearing with a walker minimizing his weightbearing at home.  The incision is healing well and his feet have improved markedly since he has been limiting his activities for the knee. Ortho Exam On examination of the right knee the incision is well-healed there are few staples remaining in place there is moderate swelling no erythema no warmth no sign of infection the Wagner grade 1 ulcer to the third metatarsal head on the right is well-healed there is some callus buildup this was parted with a 10 blade knife back to viable bleeding tissue hemostasis with silver nitrate stick.  There is no erythema no surrounding sign of infection to the left foot to the first metatarsal head he does also have a callused ulceration, Wagner grade 1 ulcer this is 15 mm in diameter there is central granulation tissue this is debrided with a 10 blade knife as well Band-Aid was applied no sign of infection to the left foot  Visit Diagnoses: No diagnosis found.  Plan: The patient will continue with current wound care for his feet minimizing his weightbearing he will follow-up in the office in 4 weeks we will evaluate his feet and discuss surgery for his right knee at that time  Follow-Up Instructions: No follow-ups on file.   Imaging: No results found.  Orders:  No orders of the defined types were placed in this encounter.  No orders of the defined types were placed in this encounter.    PMFS  History: Patient Active Problem List   Diagnosis Date Noted  . Sepsis ([place])   . Staphylococcus aureus bacteremia   . 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 Infected prosthetic knee joint, initial encounter ([place])   . Septic joint of right knee joint ([place]) 11/16/2018  . Hypokalemia 11/16/2018  . Anemia 11/16/2018  . Acute encephalopathy 10/31/2018  . Accidental drug overdose 10/31/2018  . Left arm weakness 10/31/2018  . History of transmetatarsal amputation of right foot ([place]) 08/30/2018  . Charcot foot due to diabetes mellitus ([place])   . Cellulitis 06/12/2018  . Diabetic foot infection ([place]) 06/22/2017  . Post op infection 06/22/2017  . SVT (supraventricular tachycardia) ([place]) 08/17/2016  . Tobacco use disorder 10/25/2014  . Diabetes mellitus type 2 in nonobese (HCC)   . Swelling   . Type 2 diabetes mellitus with left diabetic foot ulcer ([place]) 12/18/2013  . Diabetes mellitus due to underlying condition with foot ulcer ([place]) 12/18/2013  . Left leg cellulitis 12/18/2013  . Cocaine abuse ([place]) 03/29/2012  . Generalized anxiety disorder 03/29/2012  . Panic attacks 03/29/2012  . Alcohol dependence ([place]) 08/05/2011    Class: Chronic  . Substance abuse ([place]) 05/31/2011  . Ulcer of left foot, limited to breakdown of skin ([place]) 02/15/2011   Past Medical History:  Diagnosis Date  . Anxiety   . Arthritis    "  everywhere" (04/21/2012)  . Cellulitis and abscess of foot 12/19/2013   LEFT FOOT  . Cellulitis of right foot   . Chronic pain   . DDD (degenerative disc disease)   . Depression   . Diabetes mellitus without complication (HCC)    borderline  . Diabetic foot ulcer ([place])   . ETOH abuse   . Full dentures   . GERD (gastroesophageal reflux disease)    tums  . H/O amputation of lesser toe, right ([place]) 07/29/2017  . History of blood transfusion    "related to left knee OR; probably right hip too" (04/21/2012)  . Mental disorder   . Neuromuscular disorder (HCC)    neuropathy   . Noncompliance   . Open wound    bottom of foot  . Osteomyelitis ([place])    right 4th toe  . Osteomyelitis of right foot ([place])   . Pneumonia ~ 2012  . Polysubstance abuse (HCC)    etoh, cocaine, heroin  . Stroke [facility]) 2008   "they said I might have had one during right hip replacement" (04/21/2012)  . Wears glasses     Family History  Problem Relation Age of Onset  . Diabetes Father     Past Surgical History:  Procedure Laterality Date  . AMPUTATION Right 06/09/2017   Procedure: RIGHT 2ND TOE AMPUTATION;  Surgeon: [name], MD;  Location: [facility];  Service: Orthopedics;  Laterality: Right;  . AMPUTATION Right 07/23/2017   Procedure: RIGHT 4TH TOE AMPUTATION;  Surgeon: [name], MD;  Location: [place];  Service: Orthopedics;  Laterality: Right;  . AMPUTATION Right 06/15/2018   Procedure: RIGHT FOOT TRANSMETATARSAL AMPUTATION;  Surgeon: [name], MD;  Location: [place];  Service: Orthopedics;  Laterality: Right;  right  . EXCISIONAL TOTAL KNEE ARTHROPLASTY WITH ANTIBIOTIC SPACERS Right 11/23/2018   Procedure: REMOVAL OF RIGHT TOTAL KNEE ARTHROPLASTY AND PLACEMENT OF ANATOMIC ANTIBIOTIC SPACER;  Surgeon: [name], MD;  Location: [place];  Service: Orthopedics;  Laterality: Right;  . I&D EXTREMITY Right 11/18/2018   Procedure: RIGHT KNEE DEBRIDEMENT;  Surgeon: [name], MD;  Location: [place];  Service: Orthopedics;  Laterality: Right;  . JOINT REPLACEMENT    . KNEE ARTHROSCOPY Bilateral 1980's/1990's  . LUNG LOBECTOMY Left ~ 2006  . LUNG LOBECTOMY    . METATARSAL OSTEOTOMY  10/29/2011   Procedure: METATARSAL OSTEOTOMY;  Surgeon: [name], MD;  Location: [facility];  Service: Orthopedics;  Laterality: Left;  Left 1st Metatarsal Dorsal Closing Wedge   . METATARSAL OSTEOTOMY Right 04/28/2017   Procedure: RIGHT 1ST METATARSAL DORSAL CLOSING WEDGE OSTEOTOMY;  Surgeon: [name], MD;  Location: [facility];  Service: Orthopedics;  Laterality: Right;  . MULTIPLE TOOTH  EXTRACTIONS    . REVISION TOTAL HIP ARTHROPLASTY Right 2008   "4-5 months after replacement" (04/21/2012)  . TOTAL HIP ARTHROPLASTY Right 2008  . TOTAL KNEE ARTHROPLASTY Left 2006  . TOTAL KNEE ARTHROPLASTY Right 04/20/2012  . TOTAL KNEE ARTHROPLASTY Right 04/20/2012   Procedure: TOTAL KNEE ARTHROPLASTY;  Surgeon: [name], MD;  Location: [place];  Service: Orthopedics;  Laterality: Right;  Right Total Knee Arthroplasty   Social History   Occupational History  . Not on file  Tobacco Use  . Smoking status: Current Every Day Smoker    Packs/day: 1.00    Years: 30.00    Pack years: 30.00    Types: Cigarettes  . Smokeless tobacco: Never Used  Substance and Sexual Activity  .  Alcohol use: Not Currently    Alcohol/week: 0.0 standard drinks    Frequency: Never  . Drug use: Not Currently    Types: Cocaine    Comment: Cocaine and heroin - 10 years ago  . Sexual activity: Yes    Birth control/protection: None

## 2018-12-14 NOTE — Telephone Encounter (Signed)
Called patient to advise  °

## 2018-12-15 ENCOUNTER — Other Ambulatory Visit (HOSPITAL_COMMUNITY)
Admission: RE | Admit: 2018-12-15 | Discharge: 2018-12-15 | Disposition: A | Discharge status: Home / Self Care | Payer: Medicare Other | Source: Other Acute Inpatient Hospital | Attending: Orthopedic Surgery | Admitting: Orthopedic Surgery

## 2018-12-15 DIAGNOSIS — Z5181 Encounter for therapeutic drug level monitoring: Secondary | ICD-10-CM | POA: Insufficient documentation

## 2018-12-15 LAB — BASIC METABOLIC PANEL
Anion gap: 7 (ref 5–15)
BUN: 14 mg/dL (ref 6–20)
CO2: 23 mmol/L (ref 22–32)
Calcium: 8.5 mg/dL — ABNORMAL LOW (ref 8.9–10.3)
Chloride: 103 mmol/L (ref 98–111)
Creatinine, Ser: 0.89 mg/dL (ref 0.61–1.24)
GFR calc Af Amer: >60 mL/min (ref >60)
GFR calc non Af Amer: >60 mL/min (ref >60)
Glucose, Bld: 98 mg/dL (ref 70–99)
Potassium: 4.1 mmol/L (ref 3.5–5.1)
Sodium: 133 mmol/L — ABNORMAL LOW (ref 135–145)

## 2018-12-15 LAB — VANCOMYCIN, TROUGH: Vancomycin Tr: 24 ug/mL (ref 15–20)

## 2018-12-21 ENCOUNTER — Telehealth: Payer: Self-pay | Admitting: *Deleted

## 2018-12-21 ENCOUNTER — Telehealth: Payer: Self-pay | Admitting: Radiology

## 2018-12-21 ENCOUNTER — Ambulatory Visit (INDEPENDENT_AMBULATORY_CARE_PROVIDER_SITE_OTHER): Payer: Medicare Other | Admitting: Internal Medicine

## 2018-12-21 ENCOUNTER — Encounter: Payer: Self-pay | Admitting: Internal Medicine

## 2018-12-21 ENCOUNTER — Other Ambulatory Visit: Payer: Self-pay

## 2018-12-21 VITALS — BP 143/86 | HR 101 | Temp 97.9°F | Ht 75.0 in | Wt 159.0 lb

## 2018-12-21 DIAGNOSIS — Z5181 Encounter for therapeutic drug level monitoring: Secondary | ICD-10-CM | POA: Diagnosis not present

## 2018-12-21 DIAGNOSIS — Z96659 Presence of unspecified artificial knee joint: Secondary | ICD-10-CM | POA: Diagnosis not present

## 2018-12-21 DIAGNOSIS — T8459XD Infection and inflammatory reaction due to other internal joint prosthesis, subsequent encounter: Secondary | ICD-10-CM

## 2018-12-21 DIAGNOSIS — Z452 Encounter for adjustment and management of vascular access device: Secondary | ICD-10-CM

## 2018-12-21 MED ORDER — OXYCODONE-ACETAMINOPHEN 5-325 MG PO TABS: 1.0000 | ORAL_TABLET | Freq: Four times a day (QID) | ORAL | 0 refills | Status: DC | PRN

## 2018-12-21 NOTE — Telephone Encounter (Signed)
Patient called requesting refill on Oxycodone 5/325. His pharmacy is [facility] in [place] on [address].   CB for patient is [phone]

## 2018-12-21 NOTE — Progress Notes (Signed)
   Subjective:    Patient ID: Dominic Mahaney, male    DOB: 04-29-1965, 53 y.o.   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  Underwent resection arthroplasty by Dr. Sharol Given on 11/4.  On IV vanomcycin through 12/15.  No associated rash or diarrhea.  Baseline CRP 26, ESR 120 prior to surgery   Review of Systems  Constitutional: Negative for chills, fever and unexpected weight change.  Gastrointestinal: Negative for diarrhea.  Skin: Negative for rash.       Objective:   Physical Exam Constitutional:      Appearance: Normal appearance.  Eyes:     General: No scleral icterus. Cardiovascular:     Rate and Rhythm: Normal rate and regular rhythm.     Heart sounds: No murmur.  Pulmonary:     Effort: Pulmonary effort is normal.  Neurological:     Mental Status: He is alert.  Psychiatric:        Mood and Affect: Mood normal.   SH: + tobacco        Assessment & Plan:

## 2018-12-21 NOTE — Assessment & Plan Note (Signed)
Unfortunately blood not coming out of picc line but still working otherwise.  THis can be removed at the end of treatment

## 2018-12-21 NOTE — Telephone Encounter (Signed)
Per Dr Linus Salmons, RN relayed verbal orders to Jackelyn Poling and Lenna Sciara at [facility] for ESR and CRP to be drawn 12/3, assessment of PICC (may need cathflow again) and to pull PICC at end of treatment 12/15. Orders repeated and verified. Patient aware. Landis Gandy, RN

## 2018-12-21 NOTE — Assessment & Plan Note (Signed)
Creat has remained wnl 

## 2018-12-21 NOTE — Assessment & Plan Note (Signed)
Doing well  Knee looks good with no significant warmth.  Staples out.  No drainage. From an ID standpoint, he is on track to finish IV vancomycin on 12/15.  I will recheck the ESR and CRP and if ok,  Ok from ID standpoint to replacement at some point after 12/30 or later if no new issues arise.

## 2018-12-21 NOTE — Telephone Encounter (Signed)
Pt is requesting a refill on pain medication. He had Oxycodone 5/325 #30 11/25 and 11/19 Oxycodone 10 mg # 10 12/01/18 and #120 on 10/27/18 please advise.

## 2018-12-21 NOTE — Addendum Note (Signed)
Addended by: Dondra Prader R on: 12/21/2018 01:51 PM   Modules accepted: Orders

## 2018-12-27 ENCOUNTER — Other Ambulatory Visit: Payer: Self-pay | Admitting: Orthopedic Surgery

## 2018-12-27 MED ORDER — OXYCODONE-ACETAMINOPHEN 5-325 MG PO TABS: 1.0000 | ORAL_TABLET | Freq: Three times a day (TID) | ORAL | 0 refills | Status: DC | PRN

## 2018-12-27 NOTE — Telephone Encounter (Signed)
Erin please advise, thank you.  

## 2018-12-27 NOTE — Telephone Encounter (Signed)
Patient called in requesting a refill for percocet's at [facility] in [place] on [address]. Patient phone number is [phone]

## 2018-12-29 IMAGING — DX DG TIBIA/FIBULA 2V*L*
4 series · 4 of 4 positions shown · non-contrast
Comparison: 05/21/2014 left knee radiographs.

CLINICAL DATA: Injury to anterior left mid tibia and fibula.

EXAM:
LEFT TIBIA AND FIBULA - 2 VIEW

[tibia ap (1 of 2)]
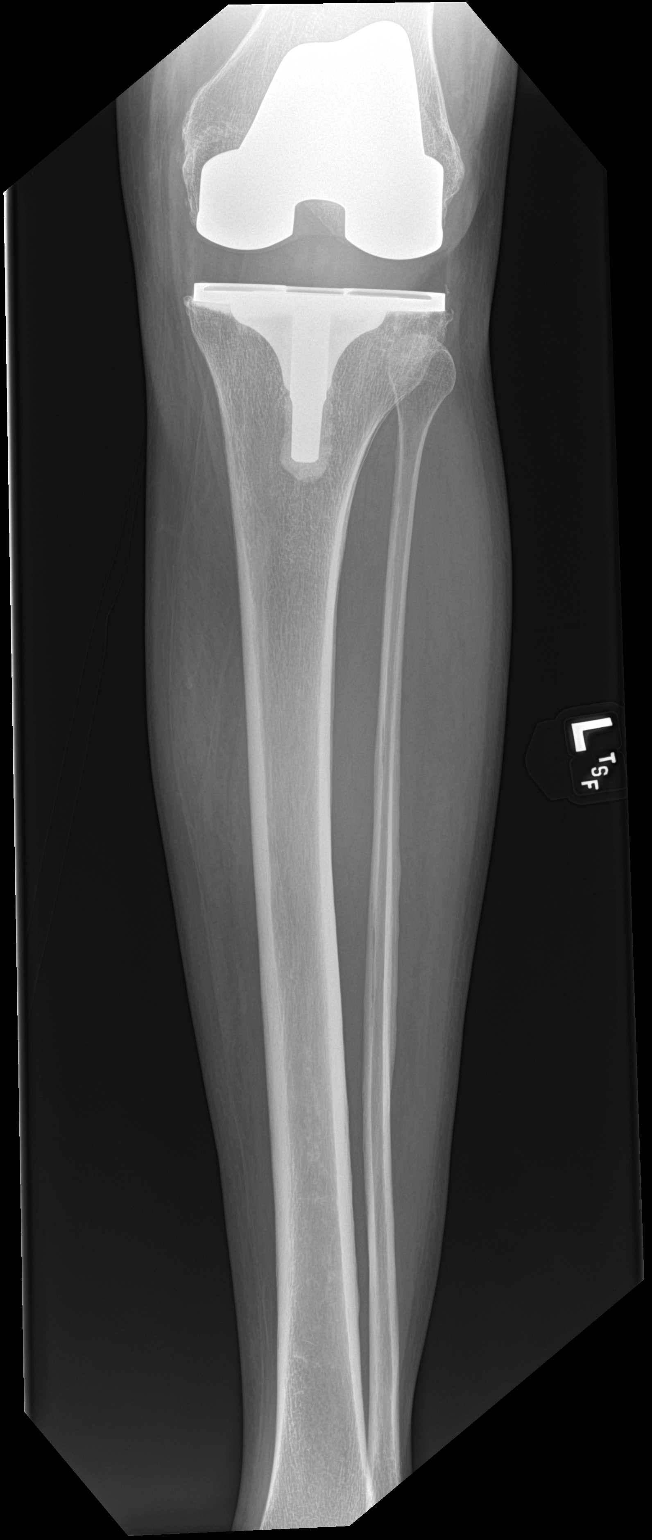

[tibia ap (2 of 2)]
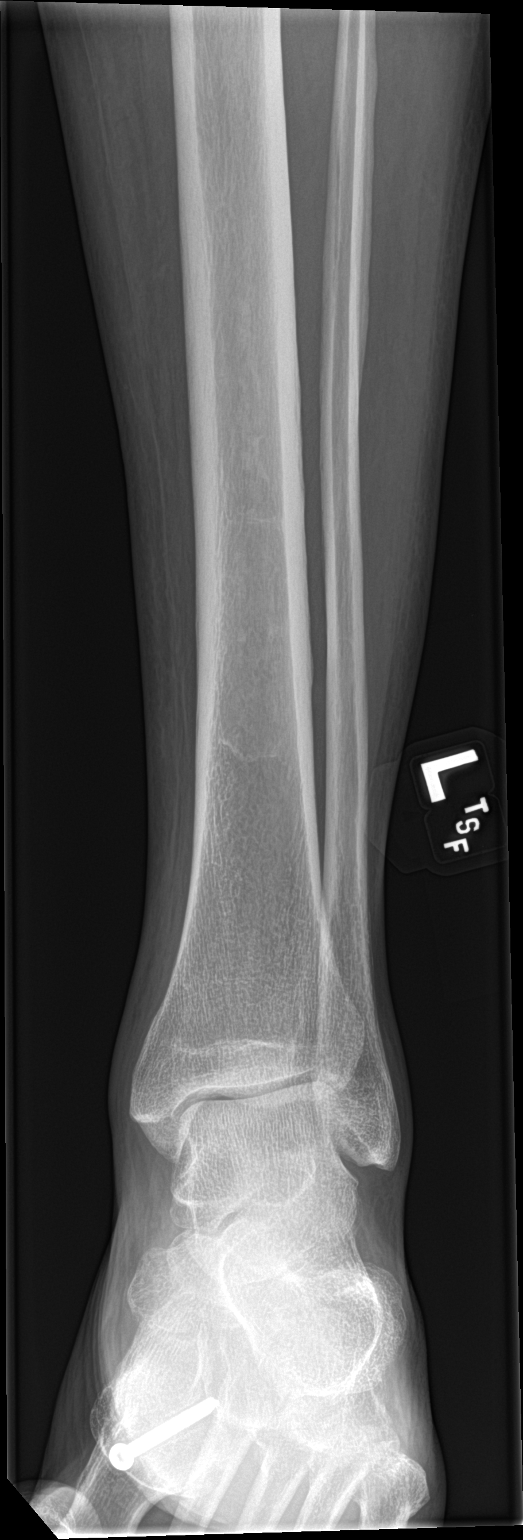

[tibia lat (1 of 2)]
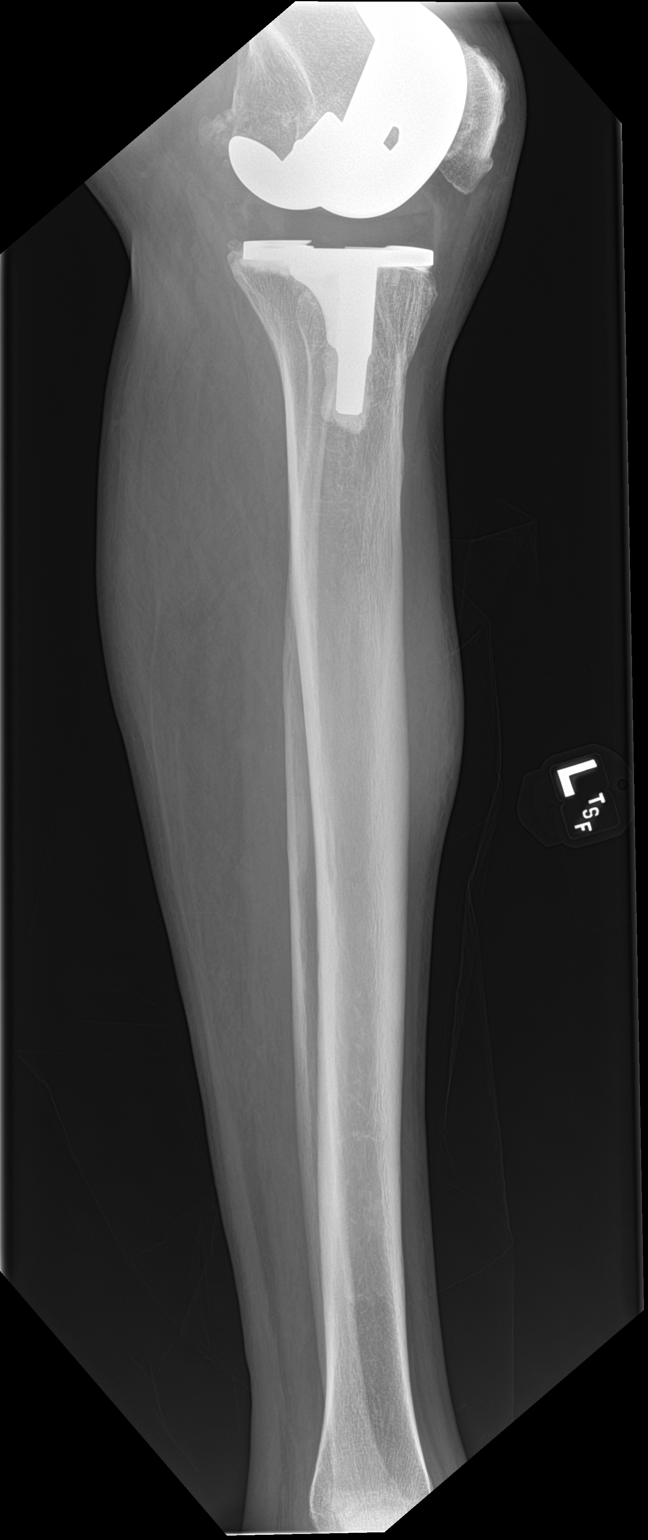

[tibia lat (2 of 2)]
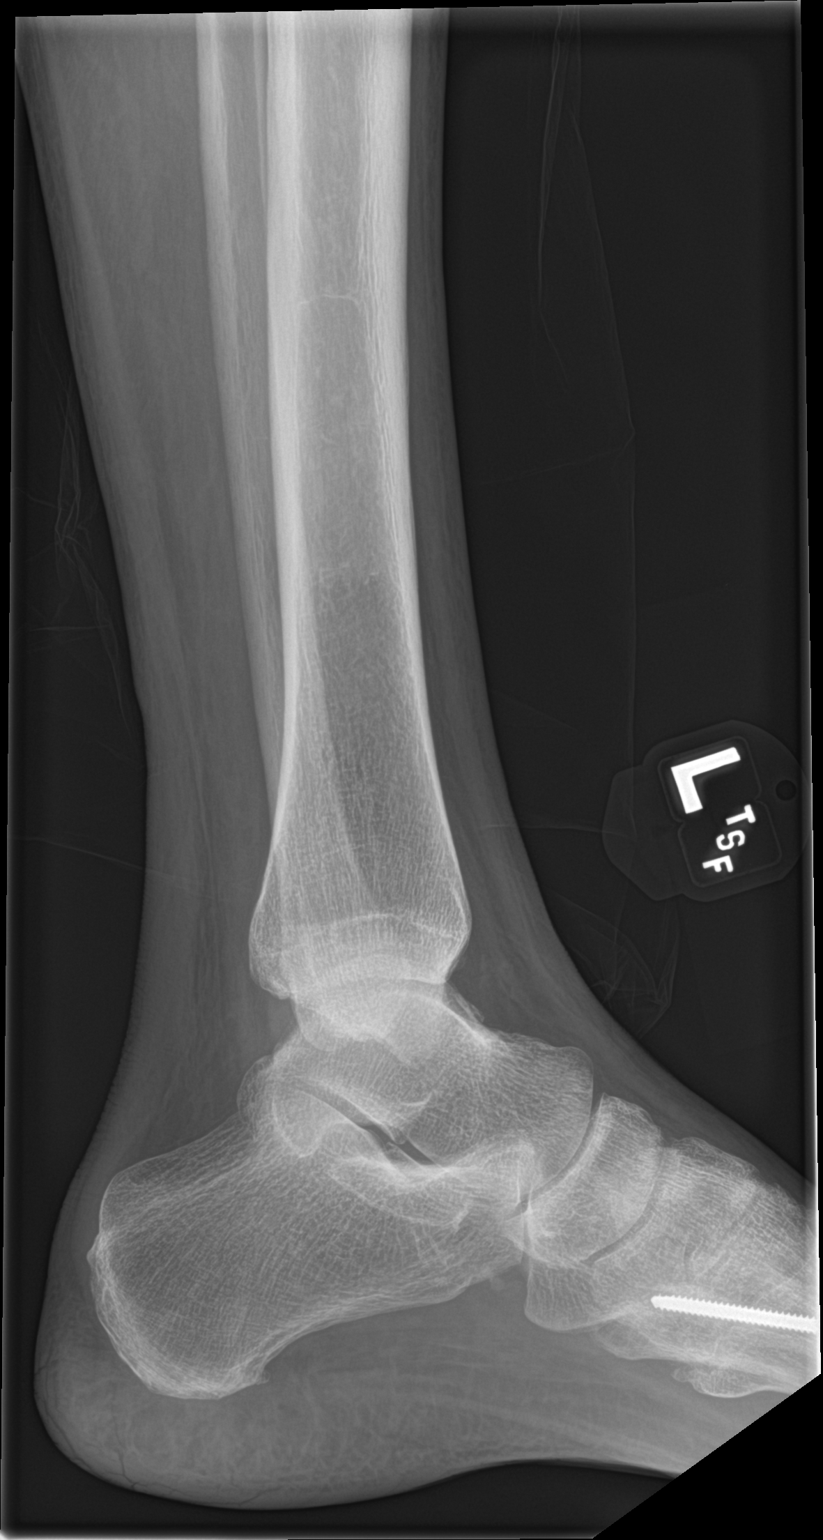

[4 of 4 positions shown; findings below may reference images not displayed]

FINDINGS: No acute fracture or dislocation identified. Soft tissue swelling
inferior to the mid tibia. Total knee arthroplasty without apparent
hardware related complication or periprosthetic lucency. Ankle joint
is well maintained. Tiny plantar calcaneal enthesophyte. Right first
metatarsal screw partially visualize.
IMPRESSION: No acute fracture or dislocation identified. Soft tissue swelling
inferior to the mid tibia.

By: Farisse Maniar M.D.

## 2018-12-31 ENCOUNTER — Other Ambulatory Visit: Payer: Self-pay

## 2018-12-31 ENCOUNTER — Emergency Department (HOSPITAL_COMMUNITY)
Admission: EM | Admit: 2018-12-31 | Discharge: 2019-01-01 | Disposition: A | Discharge status: Home / Self Care | Payer: Medicare Other | Attending: Emergency Medicine | Admitting: Emergency Medicine

## 2018-12-31 ENCOUNTER — Emergency Department (HOSPITAL_COMMUNITY): Payer: Medicare Other

## 2018-12-31 ENCOUNTER — Encounter (HOSPITAL_COMMUNITY): Payer: Self-pay | Admitting: Emergency Medicine

## 2018-12-31 DIAGNOSIS — Z96651 Presence of right artificial knee joint: Secondary | ICD-10-CM | POA: Diagnosis not present

## 2018-12-31 DIAGNOSIS — Z96652 Presence of left artificial knee joint: Secondary | ICD-10-CM | POA: Insufficient documentation

## 2018-12-31 DIAGNOSIS — Y69 Unspecified misadventure during surgical and medical care: Secondary | ICD-10-CM | POA: Insufficient documentation

## 2018-12-31 DIAGNOSIS — T82898A Other specified complication of vascular prosthetic devices, implants and grafts, initial encounter: Secondary | ICD-10-CM | POA: Diagnosis not present

## 2018-12-31 DIAGNOSIS — F17210 Nicotine dependence, cigarettes, uncomplicated: Secondary | ICD-10-CM | POA: Diagnosis not present

## 2018-12-31 DIAGNOSIS — E1140 Type 2 diabetes mellitus with diabetic neuropathy, unspecified: Secondary | ICD-10-CM | POA: Insufficient documentation

## 2018-12-31 DIAGNOSIS — Z96641 Presence of right artificial hip joint: Secondary | ICD-10-CM | POA: Insufficient documentation

## 2018-12-31 DIAGNOSIS — Z8673 Personal history of transient ischemic attack (TIA), and cerebral infarction without residual deficits: Secondary | ICD-10-CM | POA: Diagnosis not present

## 2018-12-31 DIAGNOSIS — Z452 Encounter for adjustment and management of vascular access device: Secondary | ICD-10-CM | POA: Diagnosis present

## 2018-12-31 HISTORY — DX: Carrier or suspected carrier of methicillin resistant Staphylococcus aureus: Z22.322

## 2018-12-31 LAB — ACID FAST CULTURE WITH REFLEXED SENSITIVITIES (MYCOBACTERIA): Acid Fast Culture: NEGATIVE

## 2018-12-31 MED ORDER — ALTEPLASE 2 MG IJ SOLR
2.0000 mg | Freq: Once | INTRAMUSCULAR | Status: AC
  Administered 2018-12-31: 22:00:00 2 mg
  Filled 2018-12-31: qty 2

## 2018-12-31 MED ORDER — LINEZOLID 600 MG PO TABS: 600.0000 mg | ORAL_TABLET | Freq: Two times a day (BID) | ORAL | 0 refills | Status: AC

## 2018-12-31 MED ORDER — OXYCODONE-ACETAMINOPHEN 5-325 MG PO TABS
1.0000 | ORAL_TABLET | Freq: Once | ORAL | Status: AC
  Administered 2018-12-31: 1 via ORAL
  Filled 2018-12-31: qty 1

## 2018-12-31 MED ORDER — ALTEPLASE 100 MG IV SOLR
2.0000 mg | Freq: Once | INTRAVENOUS | Status: AC
  Administered 2019-01-01: 2 mg via INTRAVENOUS

## 2018-12-31 MED ORDER — LINEZOLID 600 MG PO TABS
600.0000 mg | ORAL_TABLET | Freq: Two times a day (BID) | ORAL | Status: DC
  Administered 2019-01-01: 600 mg via ORAL
  Filled 2018-12-31 (×5): qty 1

## 2018-12-31 MED ORDER — ALTEPLASE 2 MG IJ SOLR
INTRAMUSCULAR | Status: AC
  Filled 2018-12-31: qty 2

## 2018-12-31 NOTE — Discharge Instructions (Addendum)
You were seen in the emergency department today with a complication in your PICC line.  We were unable to get the line flowing again but I spoke with our infectious disease doctor who would like to transition you over to a different antibiotic for the next 3 days to complete your antibiotic course.  The PICC line will remain in place.  Please call your team managing the PICC line to arrange removal.  Return to the emergency department with any fevers, redness around the line, pain, or other symptoms.

## 2018-12-31 NOTE — ED Provider Notes (Signed)
  Provider Note MRN:  [id number]  Arrival date & time: 12/31/18    ED Course and Medical Decision Making  Assumed care from Dr. Laverta Baltimore at shift change.  We will attempt to reestablish PICC line patency, but if unsuccessful will discharge on linezolid.  2 AM update: Despite great efforts, PICC line unable to be revived.  Sent home with prescription for linezolid.  Procedures  Final Clinical Impressions(s) / ED Diagnoses     ICD-10-CM   1. Occlusion of peripherally inserted central catheter (PICC) line, initial encounter [facility])  [id number]     ED Discharge Orders         Ordered    linezolid (ZYVOX) 600 MG tablet  2 times daily     12/31/18 2302            Discharge Instructions     You were seen in the emergency department today with a complication in your PICC line.  We were unable to get the line flowing again but I spoke with our infectious disease doctor who would like to transition you over to a different antibiotic for the next 3 days to complete your antibiotic course.  The PICC line will remain in place.  Please call your team managing the PICC line to arrange removal.  Return to the emergency department with any fevers, redness around the line, pain, or other symptoms.     Barth Kirks. [place]onia Small, [place] [email or web address]    Maudie Flakes, MD 01/01/19 [phone]

## 2018-12-31 NOTE — ED Provider Notes (Signed)
Emergency Department Provider Note   I have reviewed the triage vital signs and the nursing notes.   HISTORY  Chief Complaint Vascular Access Problem   HPI [name] is a 53 y.o. male with history of osteomyelitis currently on home vancomycin via PICC line presents to the emergency department with slowly flushing PICC.  Patient states that his home health nurse tried to flush the line with success and he was advised to present to the emergency department.  He denies any chest pain or shortness of breath.  He is not having pain or irritation around the line site.  He has not noticed any redness. No radiation of symptoms or modifying factors.   Past Medical History:  Diagnosis Date  . Anxiety   . Arthritis    "everywhere" (04/21/2012)  . Cellulitis and abscess of foot 12/19/2013   LEFT FOOT  . Cellulitis of right foot   . Chronic pain   . DDD (degenerative disc disease)   . Depression   . Diabetes mellitus without complication (HCC)    borderline  . Diabetic foot ulcer ([place])   . ETOH abuse   . Full dentures   . GERD (gastroesophageal reflux disease)    tums  . H/O amputation of lesser toe, right ([place]) 07/29/2017  . History of blood transfusion    "related to left knee OR; probably right hip too" (04/21/2012)  . Mental disorder   . MRSA (methicillin resistant staph aureus) culture positive   . Neuromuscular disorder (HCC)    neuropathy  . Noncompliance   . Open wound    bottom of foot  . Osteomyelitis ([place])    right 4th toe  . Osteomyelitis of right foot ([place])   . Pneumonia ~ 2012  . Polysubstance abuse (HCC)    etoh, cocaine, heroin  . Stroke [facility]) 2008   "they said I might have had one during right hip replacement" (04/21/2012)  . Wears glasses     Patient Active Problem List   Diagnosis Date Noted  . PICC (peripherally inserted central catheter) in place 12/21/2018  . Medication monitoring encounter 12/21/2018  . Staphylococcus aureus bacteremia   . 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 Infected prosthetic knee joint, initial encounter ([place])   . Septic joint of right knee joint ([place]) 11/16/2018  . Hypokalemia 11/16/2018  . Anemia 11/16/2018  . Acute encephalopathy 10/31/2018  . Accidental drug overdose 10/31/2018  . Left arm weakness 10/31/2018  . History of transmetatarsal amputation of right foot ([place]) 08/30/2018  . Charcot foot due to diabetes mellitus ([place])   . Cellulitis 06/12/2018  . Diabetic foot infection ([place]) 06/22/2017  . Post op infection 06/22/2017  . SVT (supraventricular tachycardia) ( [place]) 08/17/2016  . Tobacco use disorder 10/25/2014  . Diabetes mellitus type 2 in nonobese (HCC)   . Swelling   . Type 2 diabetes mellitus with left diabetic foot ulcer ([place]) 12/18/2013  . Diabetes mellitus due to underlying condition with foot ulcer ([place]) 12/18/2013  . Left leg cellulitis 12/18/2013  . Cocaine abuse ([place]) 03/29/2012  . Generalized anxiety disorder 03/29/2012  . Panic attacks 03/29/2012  . Alcohol dependence ([place]) 08/05/2011    Class: Chronic  . Substance abuse ([place]) 05/31/2011  . Ulcer of left foot, limited to breakdown of skin ([place]) 02/15/2011    Past Surgical History:  Procedure Laterality Date  . AMPUTATION Right 06/09/2017   Procedure: RIGHT 2ND TOE AMPUTATION;  Surgeon: [name], MD;  Location: [facility]  OR;  Service: Orthopedics;  Laterality: Right;  . AMPUTATION Right 07/23/2017   Procedure: RIGHT 4TH TOE AMPUTATION;  Surgeon: [name], MD;  Location: [place];  Service: Orthopedics;  Laterality: Right;  . AMPUTATION Right 06/15/2018   Procedure: RIGHT FOOT TRANSMETATARSAL AMPUTATION;  Surgeon: [name], MD;  Location: [place];  Service: Orthopedics;  Laterality: Right;  right  . EXCISIONAL TOTAL KNEE ARTHROPLASTY WITH ANTIBIOTIC SPACERS Right 11/23/2018   Procedure: REMOVAL OF RIGHT TOTAL KNEE ARTHROPLASTY AND PLACEMENT OF ANATOMIC ANTIBIOTIC SPACER;  Surgeon: [name], MD;  Location: [place];   Service: Orthopedics;  Laterality: Right;  . I&D EXTREMITY Right 11/18/2018   Procedure: RIGHT KNEE DEBRIDEMENT;  Surgeon: [name], MD;  Location: [place];  Service: Orthopedics;  Laterality: Right;  . JOINT REPLACEMENT    . KNEE ARTHROSCOPY Bilateral 1980's/1990's  . LUNG LOBECTOMY Left ~ 2006  . LUNG LOBECTOMY    . METATARSAL OSTEOTOMY  10/29/2011   Procedure: METATARSAL OSTEOTOMY;  Surgeon: [name], MD;  Location: [place];  Service: Orthopedics;  Laterality: Left;  Left 1st Metatarsal Dorsal Closing Wedge   . METATARSAL OSTEOTOMY Right 04/28/2017   Procedure: RIGHT 1ST METATARSAL DORSAL CLOSING WEDGE OSTEOTOMY;  Surgeon: [name], MD;  Location: [place];  Service: Orthopedics;  Laterality: Right;  . MULTIPLE TOOTH EXTRACTIONS    . REVISION TOTAL HIP ARTHROPLASTY Right 2008   "4-5 months after replacement" (04/21/2012)  . TOTAL HIP ARTHROPLASTY Right 2008  . TOTAL KNEE ARTHROPLASTY Left 2006  . TOTAL KNEE ARTHROPLASTY Right 04/20/2012  . TOTAL KNEE ARTHROPLASTY Right 04/20/2012   Procedure: TOTAL KNEE ARTHROPLASTY;  Surgeon: [name], MD;  Location: [place];  Service: Orthopedics;  Laterality: Right;  Right Total Knee Arthroplasty    Allergies Benadryl [diphenhydramine hcl] and Trazodone and nefazodone  Family History  Problem Relation Age of Onset  . Diabetes Father     Social History Social History   Tobacco Use  . Smoking status: Current Every Day Smoker    Packs/day: 0.50    Years: 30.00    Pack years: 15.00    Types: Cigarettes  . Smokeless tobacco: Never Used  . Tobacco comment: thinking about quitting, talking with PCP  Substance Use Topics  . Alcohol use: Not Currently    Alcohol/week: 0.0 standard drinks  . Drug use: Not Currently    Types: Cocaine    Comment: Cocaine and heroin - 10 years ago    Review of Systems  Constitutional: No fever/chills Cardiovascular: Denies chest pain. PICC line not flushing.   10-point ROS otherwise negative.   ____________________________________________   PHYSICAL EXAM:  VITAL SIGNS: ED Triage Vitals  Enc Vitals Group     BP 12/31/18 1600 137/78     Pulse Rate 12/31/18 1600 86     Resp 12/31/18 1600 20     Temp 12/31/18 1600 98.4 F (36.9 C)     Temp Source 12/31/18 1600 Oral     SpO2 12/31/18 1600 100 %     Weight 12/31/18 1603 160 lb (72.6 kg)     Height 12/31/18 1603 6\' 3"  (1.905 m)   Constitutional: Alert and oriented. Well appearing and in no acute distress. Eyes: Conjunctivae are normal.  Mouth/Throat: Mucous membranes are moist.   Neck: No stridor.   Cardiovascular: Normal rate, regular rhythm. Good peripheral circulation. Grossly normal heart sounds.   Respiratory: Normal respiratory effort.  No retractions. Lungs CTAB. Gastrointestinal: No distention.  Musculoskeletal:  No gross deformities of extremities. Neurologic:  Normal speech and language. Skin:  Skin is warm, dry and intact. No rash noted. Normal appearing PICC line in the RUE.   ____________________________________________   LABS (all labs ordered are listed, but only abnormal results are displayed)  Labs Reviewed  CBC WITH DIFFERENTIAL/PLATELET - Abnormal; Notable for the following components:      Result Value   RBC 3.87 (*)    Hemoglobin 10.2 (*)    HCT 33.3 (*)    RDW 17.8 (*)    All other components within normal limits   ____________________________________________  RADIOLOGY  DG Chest Portable 1 View  Result Date: 12/31/2018 CLINICAL DATA:  Difficulty flushing PICC line EXAM: PORTABLE CHEST 1 VIEW COMPARISON:  10/31/2018 FINDINGS: Cardiac shadow is stable. The lungs are well aerated bilaterally. Chronic blunting of the left costophrenic angle is seen. No focal infiltrate or effusion is noted. PICC line tip is noted just above the cavoatrial junction in satisfactory position. By history the PICC line cannot be flushed. No bony abnormality is noted. IMPRESSION: PICC line in satisfactory position. No  other focal abnormality is noted. Chronic blunting of the left costophrenic angle is seen. Electronically Signed   By: Inez Catalina M.D.   On: 12/31/2018 22:18    ____________________________________________   PROCEDURES  Procedure(s) performed:   Procedures  None ____________________________________________   INITIAL IMPRESSION / ASSESSMENT AND PLAN / ED COURSE  Pertinent labs & imaging results that were available during my care of the patient were reviewed by me and considered in my medical decision making (see chart for details).   Patient presents to the emergency department for evaluation of poorly flushing PICC line.  There is no evidence of infection externally.  Vital signs are normal.  Lungs are clear with no chest pain.  Plan for plain film x-ray to assess the position of the line and will try alteplase.   11:02 PM  Spoke with Dr. Tommy Medal with infectious disease.  Patient is 3 days from completing his vancomycin course.  Plan for CBC.  If normal, can do linezolid 600 mg BID for 3 days to complete course. Will give PO dose here and attempt to flush line again.   Care transferred to Dr. Sedonia Small pending PICC line flush attempt.  ____________________________________________  FINAL CLINICAL IMPRESSION(S) / ED DIAGNOSES  Final diagnoses:  Occlusion of peripherally inserted central catheter (PICC) line, initial encounter [facility])     MEDICATIONS GIVEN DURING THIS VISIT:  Medications  alteplase (CATHFLO ACTIVASE) injection 2 mg (2 mg Intracatheter Given 12/31/18 2158)  oxyCODONE-acetaminophen (PERCOCET/ROXICET) 5-325 MG per tablet 1 tablet (1 tablet Oral Given 12/31/18 2201)  alteplase (ACTIVASE) injection 2 mg (2 mg Intravenous Given 01/01/19 0021)     NEW OUTPATIENT MEDICATIONS STARTED DURING THIS VISIT:  Discharge Medication List as of 01/01/2019  2:01 AM    START taking these medications   Details  linezolid (ZYVOX) 600 MG tablet Take 1 tablet (600 mg total) by mouth  2 (two) times daily for 3 days., Starting Sat 12/31/2018, Until Tue 01/03/2019, Print        Note:  This document was prepared using Dragon voice recognition software and may include unintentional dictation errors.  Nanda Quinton, MD, [facility] Emergency Medicine    Rheba Diamond, Wonda Old[facility], MD 01/01/19 [phone]

## 2018-12-31 NOTE — ED Triage Notes (Signed)
Patient c/o PICC line problem. Per patient PICC line was placed 1 month ago and was flushed by [place] 2 weeks ago. Patient receiving Vancomycin through PICC daily due to MRSA in knee. [name] nurse came this morning to flush cath and was unable. Per patient lost dose of antibiotics is scheduled for Tuesday. Patient denies any redness or pain around PICC line.

## 2019-01-01 ENCOUNTER — Other Ambulatory Visit (HOSPITAL_COMMUNITY)
Admission: RE | Admit: 2019-01-01 | Discharge: 2019-01-01 | Disposition: A | Discharge status: Home / Self Care | Payer: Medicare Other | Attending: Internal Medicine | Admitting: Internal Medicine

## 2019-01-01 LAB — CBC WITH DIFFERENTIAL/PLATELET
Abs Immature Granulocytes: 0.03 10*3/uL (ref 0.00–0.07)
Abs Immature Granulocytes: 0.03 10*3/uL (ref 0.00–0.07)
Basophils Absolute: 0.1 10*3/uL (ref 0.0–0.1)
Basophils Absolute: 0.1 10*3/uL (ref 0.0–0.1)
Basophils Relative: 1 %
Basophils Relative: 1 %
Eosinophils Absolute: 0.2 10*3/uL (ref 0.0–0.5)
Eosinophils Absolute: 0.2 10*3/uL (ref 0.0–0.5)
Eosinophils Relative: 2 %
Eosinophils Relative: 3 %
HCT: 33.3 % — ABNORMAL LOW (ref 39.0–52.0)
HCT: 34.6 % — ABNORMAL LOW (ref 39.0–52.0)
Hemoglobin: 10.2 g/dL — ABNORMAL LOW (ref 13.0–17.0)
Hemoglobin: 10.5 g/dL — ABNORMAL LOW (ref 13.0–17.0)
Immature Granulocytes: 0 %
Immature Granulocytes: 0 %
Lymphocytes Relative: 21 %
Lymphocytes Relative: 29 %
Lymphs Abs: 1.5 10*3/uL (ref 0.7–4.0)
Lymphs Abs: 2.4 10*3/uL (ref 0.7–4.0)
MCH: 26.1 pg (ref 26.0–34.0)
MCH: 26.4 pg (ref 26.0–34.0)
MCHC: 30.3 g/dL (ref 30.0–36.0)
MCHC: 30.6 g/dL (ref 30.0–36.0)
MCV: 86 fL (ref 80.0–100.0)
MCV: 86.1 fL (ref 80.0–100.0)
Monocytes Absolute: 0.4 10*3/uL (ref 0.1–1.0)
Monocytes Absolute: 0.7 10*3/uL (ref 0.1–1.0)
Monocytes Relative: 6 %
Monocytes Relative: 8 %
Neutro Abs: 4.9 10*3/uL (ref 1.7–7.7)
Neutro Abs: 5.1 10*3/uL (ref 1.7–7.7)
Neutrophils Relative %: 59 %
Neutrophils Relative %: 70 %
Platelets: 239 10*3/uL (ref 150–400)
Platelets: 242 10*3/uL (ref 150–400)
RBC: 3.87 MIL/uL — ABNORMAL LOW (ref 4.22–5.81)
RBC: 4.02 MIL/uL — ABNORMAL LOW (ref 4.22–5.81)
RDW: 17.7 % — ABNORMAL HIGH (ref 11.5–15.5)
RDW: 17.8 % — ABNORMAL HIGH (ref 11.5–15.5)
WBC: 7.1 10*3/uL (ref 4.0–10.5)
WBC: 8.5 10*3/uL (ref 4.0–10.5)
nRBC: 0 % (ref 0.0–0.2)
nRBC: 0 % (ref 0.0–0.2)

## 2019-01-01 LAB — BASIC METABOLIC PANEL
Anion gap: 10 (ref 5–15)
BUN: 16 mg/dL (ref 6–20)
CO2: 20 mmol/L — ABNORMAL LOW (ref 22–32)
Calcium: 8.6 mg/dL — ABNORMAL LOW (ref 8.9–10.3)
Chloride: 107 mmol/L (ref 98–111)
Creatinine, Ser: 0.94 mg/dL (ref 0.61–1.24)
GFR calc Af Amer: >60 mL/min (ref >60)
GFR calc non Af Amer: >60 mL/min (ref >60)
Glucose, Bld: 250 mg/dL — ABNORMAL HIGH (ref 70–99)
Potassium: 3.6 mmol/L (ref 3.5–5.1)
Sodium: 137 mmol/L (ref 135–145)

## 2019-01-01 LAB — C-REACTIVE PROTEIN: CRP: 1 mg/dL — ABNORMAL HIGH (ref <1.0)

## 2019-01-01 LAB — VANCOMYCIN, TROUGH: Vancomycin Tr: 5 ug/mL — ABNORMAL LOW (ref 15–20)

## 2019-01-01 LAB — SEDIMENTATION RATE: Sed Rate: 20 mm/hr — ABNORMAL HIGH (ref 0–16)

## 2019-01-06 ENCOUNTER — Encounter: Payer: Medicare Other | Admitting: Physical Medicine and Rehabilitation

## 2019-01-09 ENCOUNTER — Encounter: Payer: Self-pay | Admitting: Orthopedic Surgery

## 2019-01-09 ENCOUNTER — Ambulatory Visit (INDEPENDENT_AMBULATORY_CARE_PROVIDER_SITE_OTHER): Payer: Medicare Other | Admitting: Orthopedic Surgery

## 2019-01-09 ENCOUNTER — Other Ambulatory Visit: Payer: Self-pay

## 2019-01-09 VITALS — Ht 75.0 in | Wt 160.0 lb

## 2019-01-09 DIAGNOSIS — L97521 Non-pressure chronic ulcer of other part of left foot limited to breakdown of skin: Secondary | ICD-10-CM

## 2019-01-09 DIAGNOSIS — T8453XD Infection and inflammatory reaction due to internal right knee prosthesis, subsequent encounter: Secondary | ICD-10-CM

## 2019-01-09 NOTE — Progress Notes (Signed)
Office Visit Note   Patient: [name]           Date of Birth: Apr 01, 1965           MRN: [id number] Visit Date: 01/09/2019              Requested by: Lucia Gaskins, MD [address] [place],  [place] [address] PCP: Lucia Gaskins, MD  Chief Complaint  Patient presents with  . Right Knee - Routine Post Op    11/23/2018 Removal R-TKA & Placement Anatomic ABX Spacer      HPI: Patient is a 53 year old gentleman who presents for 2 separate issues #1 status post removal of right total knee arthroplasty knee with placement of a total knee arthroplasty made out of polymethylmethacrylate cement.  Patient has completed a 6 weeks course of IV antibiotics his PICC line has been discontinued.  Patient also presents complaining of worsening ulceration beneath the first metatarsal left foot that he states has gotten worse since he is weightbearing mostly on the left side with a walker.  Assessment & Plan: Visit Diagnoses:  1. Ulcer of left foot, limited to breakdown of skin ([place])   2. Infection of total right knee replacement, subsequent encounter     Plan: Ulcer was debrided left foot of skin and soft tissue.  Plan for surgery in January for revision of the right total knee arthroplasty with removal of the anatomic antibiotic total knee replacement.  Risk and benefits were discussed including recurrent infection need for additional surgery.  Patient states he understands wished to proceed at this time.  Follow-Up Instructions: Return for Follow-up after total knee arthroplasty revision right.   Ortho Exam  Patient is alert, oriented, no adenopathy, well-dressed, normal affect, normal respiratory effort. Examination the right knee is swollen but there is no effusion no redness no cellulitis no pain with weightbearing.  Examination left foot he has a recurrent Wagner grade 1 ulcer beneath the first metatarsal head left foot.  After informed consent a 10 blade knife was used to debride  the skin and soft tissue back to bleeding viable granulation tissue this was touched with silver nitrate the ulcer is 3 cm in diameter 3 mm deep there is no exposed bone or tendon.  A Band-Aid was applied.  Imaging: No results found. No images are attached to the encounter.  Labs: Lab Results  Component Value Date   HGBA1C 6.4 (H) 10/31/2018   HGBA1C 6.2 (H) 06/13/2018   HGBA1C 6.7 (H) 06/07/2017   ESRSEDRATE 20 (H) 01/01/2019   ESRSEDRATE 120 (H) 11/16/2018   ESRSEDRATE 30 (H) 06/13/2018   CRP 1.0 (H) 01/01/2019   CRP 36 (H) 11/16/2018   CRP 5.7 (H) 06/13/2018   REPTSTATUS 11/28/2018 FINAL 11/23/2018   GRAMSTAIN  11/23/2018    FEW WBC PRESENT, PREDOMINANTLY PMN NO ORGANISMS SEEN    CULT  11/23/2018    RARE STAPHYLOCOCCUS AUREUS CRITICAL RESULT CALLED TO, READ BACK BY AND VERIFIED WITH: Deno Lunger RN, AT 1053 11/25/18 BY D. VANHOOK REGARDING CULTURE GROWTH NO ANAEROBES ISOLATED Performed at [facility], [place] [address] [place], [place] [address]    LABORGA STAPHYLOCOCCUS AUREUS 11/23/2018     Lab Results  Component Value Date   ALBUMIN 2.1 (L) 11/26/2018   ALBUMIN 3.1 (L) 11/16/2018   ALBUMIN 3.9 10/31/2018    Lab Results  Component Value Date   MG 1.8 11/28/2018   MG 1.7 11/27/2018   MG 2.0 11/26/2018   No results  found for: VD25OH  No results found for: PREALBUMIN CBC EXTENDED Latest Ref Rng & Units 01/01/2019 01/01/2019 11/28/2018  WBC 4.0 - 10.5 K/uL 7.1 8.5 20.3(H)  RBC 4.22 - 5.81 MIL/uL 4.02(L) 3.87(L) 3.58(L)  HGB 13.0 - 17.0 g/dL 10.5(L) 10.2(L) 9.7(L)  HCT 39.0 - 52.0 % 34.6(L) 33.3(L) 28.8(L)  PLT 150 - 400 K/uL 242 239 498(H)  NEUTROABS 1.7 - 7.7 K/uL 4.9 5.1 -  LYMPHSABS 0.7 - 4.0 K/uL 1.5 2.4 -     Body mass index is 20 kg/m.  Orders:  No orders of the defined types were placed in this encounter.  No orders of the defined types were placed in this encounter.    Procedures: No procedures performed  Clinical Data: No  additional findings.  ROS:  All other systems negative, except as noted in the HPI. Review of Systems  Objective: Vital Signs: Ht 6\' 3"  (1.905 m)   Wt 160 lb (72.6 kg)   BMI 20.00 kg/m   Specialty Comments:  No specialty comments available.  PMFS History: Patient Active Problem List   Diagnosis Date Noted  . PICC (peripherally inserted central catheter) in place 12/21/2018  . Medication monitoring encounter 12/21/2018  . Staphylococcus aureus bacteremia   . 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 Infected prosthetic knee joint, initial encounter ([place])   . Septic joint of right knee joint ([place]) 11/16/2018  . Hypokalemia 11/16/2018  . Anemia 11/16/2018  . Acute encephalopathy 10/31/2018  . Accidental drug overdose 10/31/2018  . Left arm weakness 10/31/2018  . History of transmetatarsal amputation of right foot ([place]) 08/30/2018  . Charcot foot due to diabetes mellitus ([place])   . Cellulitis 06/12/2018  . Diabetic foot infection ([place]) 06/22/2017  . Post op infection 06/22/2017  . SVT (supraventricular tachycardia) ([place]) 08/17/2016  . Tobacco use disorder 10/25/2014  . Diabetes mellitus type 2 in nonobese (HCC)   . Swelling   . Type 2 diabetes mellitus with left diabetic foot ulcer ([place]) 12/18/2013  . Diabetes mellitus due to underlying condition with foot ulcer ([place]) 12/18/2013  . Left leg cellulitis 12/18/2013  . Cocaine abuse ([place]) 03/29/2012  . Generalized anxiety disorder 03/29/2012  . Panic attacks 03/29/2012  . Alcohol dependence ([place]) 08/05/2011    Class: Chronic  . Substance abuse ([place]) 05/31/2011  . Ulcer of left foot, limited to breakdown of skin ([place]) 02/15/2011   Past Medical History:  Diagnosis Date  . Anxiety   . Arthritis    "everywhere" (04/21/2012)  . Cellulitis and abscess of foot 12/19/2013   LEFT FOOT  . Cellulitis of right foot   . Chronic pain   . DDD (degenerative disc disease)   . Depression   . Diabetes mellitus without complication  (HCC)    borderline  . Diabetic foot ulcer ([place])   . ETOH abuse   . Full dentures   . GERD (gastroesophageal reflux disease)    tums  . H/O amputation of lesser toe, right ([place]) 07/29/2017  . History of blood transfusion    "related to left knee OR; probably right hip too" (04/21/2012)  . Mental disorder   . MRSA (methicillin resistant staph aureus) culture positive   . Neuromuscular disorder (HCC)    neuropathy  . Noncompliance   . Open wound    bottom of foot  . Osteomyelitis ([place])    right 4th toe  . Osteomyelitis of right foot ([place])   . Pneumonia ~ 2012  . Polysubstance abuse ([place])  etoh, cocaine, heroin  . Stroke [facility]) 2008   "they said I might have had one during right hip replacement" (04/21/2012)  . Wears glasses     Family History  Problem Relation Age of Onset  . Diabetes Father     Past Surgical History:  Procedure Laterality Date  . AMPUTATION Right 06/09/2017   Procedure: RIGHT 2ND TOE AMPUTATION;  Surgeon: [name], MD;  Location: [place];  Service: Orthopedics;  Laterality: Right;  . AMPUTATION Right 07/23/2017   Procedure: RIGHT 4TH TOE AMPUTATION;  Surgeon: [name], MD;  Location: [place];  Service: Orthopedics;  Laterality: Right;  . AMPUTATION Right 06/15/2018   Procedure: RIGHT FOOT TRANSMETATARSAL AMPUTATION;  Surgeon: [name], MD;  Location: [place];  Service: Orthopedics;  Laterality: Right;  right  . EXCISIONAL TOTAL KNEE ARTHROPLASTY WITH ANTIBIOTIC SPACERS Right 11/23/2018   Procedure: REMOVAL OF RIGHT TOTAL KNEE ARTHROPLASTY AND PLACEMENT OF ANATOMIC ANTIBIOTIC SPACER;  Surgeon: [name], MD;  Location: [place];  Service: Orthopedics;  Laterality: Right;  . I & D EXTREMITY Right 11/18/2018   Procedure: RIGHT KNEE DEBRIDEMENT;  Surgeon: [name], MD;  Location: [place];  Service: Orthopedics;  Laterality: Right;  . JOINT REPLACEMENT    . KNEE ARTHROSCOPY Bilateral 1980's/1990's  . LUNG LOBECTOMY Left ~ 2006  . LUNG LOBECTOMY    .  METATARSAL OSTEOTOMY  10/29/2011   Procedure: METATARSAL OSTEOTOMY;  Surgeon: [name], MD;  Location: [place];  Service: Orthopedics;  Laterality: Left;  Left 1st Metatarsal Dorsal Closing Wedge   . METATARSAL OSTEOTOMY Right 04/28/2017   Procedure: RIGHT 1ST METATARSAL DORSAL CLOSING WEDGE OSTEOTOMY;  Surgeon: [name], MD;  Location: [place];  Service: Orthopedics;  Laterality: Right;  . MULTIPLE TOOTH EXTRACTIONS    . REVISION TOTAL HIP ARTHROPLASTY Right 2008   "4-5 months after replacement" (04/21/2012)  . TOTAL HIP ARTHROPLASTY Right 2008  . TOTAL KNEE ARTHROPLASTY Left 2006  . TOTAL KNEE ARTHROPLASTY Right 04/20/2012  . TOTAL KNEE ARTHROPLASTY Right 04/20/2012   Procedure: TOTAL KNEE ARTHROPLASTY;  Surgeon: [name], MD;  Location: [place];  Service: Orthopedics;  Laterality: Right;  Right Total Knee Arthroplasty   Social History   Occupational History  . Not on file  Tobacco Use  . Smoking status: Current Every Day Smoker    Packs/day: 0.50    Years: 30.00    Pack years: 15.00    Types: Cigarettes  . Smokeless tobacco: Never Used  . Tobacco comment: thinking about quitting, talking with PCP  Substance and Sexual Activity  . Alcohol use: Not Currently    Alcohol/week: 0.0 standard drinks  . Drug use: Not Currently    Types: Cocaine    Comment: Cocaine and heroin - 10 years ago  . Sexual activity: Yes    Birth control/protection: None

## 2019-01-17 ENCOUNTER — Telehealth: Payer: Self-pay | Admitting: Orthopedic Surgery

## 2019-01-17 NOTE — Telephone Encounter (Signed)
Pt had a refill of Oxycodone 10 mg #120 01/02/19, Oxycodone 5/325 # 63 12/28/18 and Oxycodone 5/325 # 30 12/21/18 please advise.

## 2019-01-17 NOTE — Telephone Encounter (Signed)
Pt called in requesting a refill on Oxycodone, please have that sent to [facility] in [place] on [address].   [phone]

## 2019-01-18 MED ORDER — OXYCODONE-ACETAMINOPHEN 5-325 MG PO TABS: 1.0000 | ORAL_TABLET | Freq: Two times a day (BID) | ORAL | 0 refills | Status: DC | PRN
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
## 2019-01-30 ENCOUNTER — Telehealth: Payer: Self-pay | Admitting: Orthopedic Surgery

## 2019-01-30 NOTE — Telephone Encounter (Signed)
I called and sw pt to advise of message below. He voiced understanding.

## 2019-01-30 NOTE — Telephone Encounter (Signed)
Pt is requesting a refill on his Oxycodone 5/325 last filled # 20 on 01/18/19, Oxycodone 10 mg #120 filled 01/02/19

## 2019-01-30 NOTE — Telephone Encounter (Signed)
Patient called.  He is requesting a refill on his oxycodone.  Call back number: [phone]

## 2019-01-30 NOTE — Telephone Encounter (Signed)
We cannot refill Percocet prescription since he is getting pain medication from a pain clinic.
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
## 2019-02-23 ENCOUNTER — Ambulatory Visit (INDEPENDENT_AMBULATORY_CARE_PROVIDER_SITE_OTHER): Payer: Medicare Other | Admitting: Orthopedic Surgery

## 2019-02-23 ENCOUNTER — Encounter: Payer: Self-pay | Admitting: Orthopedic Surgery

## 2019-02-23 ENCOUNTER — Ambulatory Visit (INDEPENDENT_AMBULATORY_CARE_PROVIDER_SITE_OTHER): Payer: Medicare Other

## 2019-02-23 ENCOUNTER — Other Ambulatory Visit: Payer: Self-pay

## 2019-02-23 VITALS — Ht 75.0 in | Wt 160.0 lb

## 2019-02-23 DIAGNOSIS — T8453XD Infection and inflammatory reaction due to internal right knee prosthesis, subsequent encounter: Secondary | ICD-10-CM

## 2019-02-23 DIAGNOSIS — L97521 Non-pressure chronic ulcer of other part of left foot limited to breakdown of skin: Secondary | ICD-10-CM | POA: Diagnosis not present

## 2019-02-23 NOTE — Progress Notes (Signed)
Office Visit Note   Patient: [name]           Date of Birth: 1965-12-07           MRN: [id number] Visit Date: 02/23/2019              Requested by: Lucia Gaskins, MD [address] [place],   [address] PCP: Lucia Gaskins, MD  Chief Complaint  Patient presents with   Right Knee - Routine Post Op    11/23/18 right removal total knee ABX spacer      HPI: Patient is a 54 year old gentleman who presents 3 months status post removal of infected total knee components placement of an antibiotic spacer IV antibiotics which he has completed patient currently ambulates with a walker is wearing a regular sneaker.  Patient also complains of a new problem with ulcers on both feet.  Patient complains of a worsening ulcer on the left foot and a healing ulcer on the right transmetatarsal amputation.  Assessment & Plan: Visit Diagnoses:  1. Infection of total right knee replacement, subsequent encounter   2. Ulcer of left foot, limited to breakdown of skin ([place])     Plan: Ulcer was debrided of skin and soft tissue on the left foot there is healthy granulation tissue the ulcer on the right foot has superficial epithelialization and the right total knee spacer is intact and functional.  Will plan for surgical intervention for the right knee for a revision total knee arthroplasty risk and benefits were discussed including recurrent infection nonhealing of the wound need for additional surgery.  Patient states he understands wished to proceed at this time he will continue with wound care for both feet.  Follow-Up Instructions: Return in about 3 weeks (around 03/16/2019).   Ortho Exam  Patient is alert, oriented, no adenopathy, well-dressed, normal affect, normal respiratory effort. Examination his knee is straight there is no redness no cellulitis there is a minimal effusion no signs of infection radiographs show stable alignment.  Patient has a new blister on the right  transmetatarsal amputation this is 10 mm in diameter is healthy superficial epithelialization with no cellulitis no odor no drainage.  Examination of the left foot patient has a cavovarus foot with a Wagner grade 1 ulcer beneath the first metatarsal head which is larger.  After informed consent a 10 blade knife was used to debride the skin and soft tissue back to healthy viable granulation tissue.  This left a wound that was 3 cm in diameter 1 mm deep with healthy granulation tissue no exposed bone or tendon no signs of infection.  Imaging: XR Knee 1-2 Views Right  Result Date: 02/23/2019 2 view radiographs of the right knee shows stable anatomic antibiotic spacer no subsidence or fractures.  No images are attached to the encounter.  Labs: Lab Results  Component Value Date   HGBA1C 6.4 (H) 10/31/2018   HGBA1C 6.2 (H) 06/13/2018   HGBA1C 6.7 (H) 06/07/2017   ESRSEDRATE 20 (H) 01/01/2019   ESRSEDRATE 120 (H) 11/16/2018   ESRSEDRATE 30 (H) 06/13/2018   CRP 1.0 (H) 01/01/2019   CRP 36 (H) 11/16/2018   CRP 5.7 (H) 06/13/2018   REPTSTATUS 11/28/2018 FINAL 11/23/2018   GRAMSTAIN  11/23/2018    FEW WBC PRESENT, PREDOMINANTLY PMN NO ORGANISMS SEEN    CULT  11/23/2018    RARE STAPHYLOCOCCUS AUREUS CRITICAL RESULT CALLED TO, READ BACK BY AND VERIFIED WITH: Deno Lunger RN, AT 1053 11/25/18 BY D. VANHOOK REGARDING CULTURE  GROWTH NO ANAEROBES ISOLATED Performed at [facility], [place] [address], [place], [place] [address]    LABORGA STAPHYLOCOCCUS AUREUS 11/23/2018     Lab Results  Component Value Date   ALBUMIN 2.1 (L) 11/26/2018   ALBUMIN 3.1 (L) 11/16/2018   ALBUMIN 3.9 10/31/2018    Lab Results  Component Value Date   MG 1.8 11/28/2018   MG 1.7 11/27/2018   MG 2.0 11/26/2018   No results found for: VD25OH  No results found for: PREALBUMIN CBC EXTENDED Latest Ref Rng & Units 01/01/2019 01/01/2019 11/28/2018  WBC 4.0 - 10.5 K/uL 7.1 8.5 20.3(H)  RBC 4.22 - 5.81 MIL/uL  4.02(L) 3.87(L) 3.58(L)  HGB 13.0 - 17.0 g/dL 10.5(L) 10.2(L) 9.7(L)  HCT 39.0 - 52.0 % 34.6(L) 33.3(L) 28.8(L)  PLT 150 - 400 K/uL 242 239 498(H)  NEUTROABS 1.7 - 7.7 K/uL 4.9 5.1 -  LYMPHSABS 0.7 - 4.0 K/uL 1.5 2.4 -     Body mass index is 20 kg/m.  Orders:  Orders Placed This Encounter  Procedures   XR Knee 1-2 Views Right   No orders of the defined types were placed in this encounter.    Procedures: No procedures performed  Clinical Data: No additional findings.  ROS:  All other systems negative, except as noted in the HPI. Review of Systems  Objective: Vital Signs: Ht 6\' 3"  (1.905 m)    Wt 160 lb (72.6 kg)    BMI 20.00 kg/m   Specialty Comments:  No specialty comments available.  PMFS History: Patient Active Problem List   Diagnosis Date Noted   PICC (peripherally inserted central catheter) in place 12/21/2018   Medication monitoring encounter 12/21/2018   Staphylococcus aureus bacteremia    Infection of total knee replacement ([place])    Infected prosthetic knee joint, initial encounter ([place])    Septic joint of right knee joint ([place]) 11/16/2018   Hypokalemia 11/16/2018   Anemia 11/16/2018   Acute encephalopathy 10/31/2018   Accidental drug overdose 10/31/2018   Left arm weakness 10/31/2018   History of transmetatarsal amputation of right foot ([place]) 08/30/2018   Charcot foot due to diabetes mellitus ([place])    Cellulitis 06/12/2018   Diabetic foot infection ([place]) 06/22/2017   Post op infection 06/22/2017   SVT (supraventricular tachycardia) ([place]) 08/17/2016   Tobacco use disorder 10/25/2014   Diabetes mellitus type 2 in nonobese ([place])    Swelling    Type 2 diabetes mellitus with left diabetic foot ulcer ([place]) 12/18/2013   Diabetes mellitus due to underlying condition with foot ulcer ([place]) 12/18/2013   Left leg cellulitis 12/18/2013   Cocaine abuse ([place]) 03/29/2012   Generalized anxiety disorder 03/29/2012   Panic attacks  03/29/2012   Alcohol dependence ([place]) 08/05/2011    Class: Chronic   Substance abuse ([place]) 05/31/2011   Ulcer of left foot, limited to breakdown of skin ([place]) 02/15/2011   Past Medical History:  Diagnosis Date   Anxiety    Arthritis    "everywhere" (04/21/2012)   Cellulitis and abscess of foot 12/19/2013   LEFT FOOT   Cellulitis of right foot    Chronic pain    DDD (degenerative disc disease)    Depression    Diabetes mellitus without complication (HCC)    borderline   Diabetic foot ulcer (HCC)    ETOH abuse    Full dentures    GERD (gastroesophageal reflux disease)    tums   H/O amputation of lesser toe, right ([place]) 07/29/2017  History of blood transfusion    "related to left knee OR; probably right hip too" (04/21/2012)   Mental disorder    MRSA (methicillin resistant staph aureus) culture positive    Neuromuscular disorder ([place])    neuropathy   Noncompliance    Open wound    bottom of foot   Osteomyelitis ([place])    right 4th toe   Osteomyelitis of right foot ([place])    Pneumonia ~ 2012   Polysubstance abuse ([place])    etoh, cocaine, heroin   Stroke [facility]) 2008   "they said I might have had one during right hip replacement" (04/21/2012)   Wears glasses     Family History  Problem Relation Age of Onset   Diabetes Father     Past Surgical History:  Procedure Laterality Date   AMPUTATION Right 06/09/2017   Procedure: RIGHT 2ND TOE AMPUTATION;  Surgeon: [name], MD;  Location: [place];  Service: Orthopedics;  Laterality: Right;   AMPUTATION Right 07/23/2017   Procedure: RIGHT 4TH TOE AMPUTATION;  Surgeon: [name], MD;  Location: [place];  Service: Orthopedics;  Laterality: Right;   AMPUTATION Right 06/15/2018   Procedure: RIGHT FOOT TRANSMETATARSAL AMPUTATION;  Surgeon: [name], MD;  Location: [place];  Service: Orthopedics;  Laterality: Right;  right   EXCISIONAL TOTAL KNEE ARTHROPLASTY WITH ANTIBIOTIC SPACERS Right 11/23/2018    Procedure: REMOVAL OF RIGHT TOTAL KNEE ARTHROPLASTY AND PLACEMENT OF ANATOMIC ANTIBIOTIC SPACER;  Surgeon: [name], MD;  Location: [place];  Service: Orthopedics;  Laterality: Right;   I & D EXTREMITY Right 11/18/2018   Procedure: RIGHT KNEE DEBRIDEMENT;  Surgeon: [name], MD;  Location: [place];  Service: Orthopedics;  Laterality: Right;   JOINT REPLACEMENT     KNEE ARTHROSCOPY Bilateral 1980's/1990's   LUNG LOBECTOMY Left ~ 2006   LUNG LOBECTOMY     METATARSAL OSTEOTOMY  10/29/2011   Procedure: METATARSAL OSTEOTOMY;  Surgeon: [name], MD;  Location: [place];  Service: Orthopedics;  Laterality: Left;  Left 1st Metatarsal Dorsal Closing Wedge    METATARSAL OSTEOTOMY Right 04/28/2017   Procedure: RIGHT 1ST METATARSAL DORSAL CLOSING WEDGE OSTEOTOMY;  Surgeon: [name], MD;  Location: [place];  Service: Orthopedics;  Laterality: Right;   MULTIPLE TOOTH EXTRACTIONS     REVISION TOTAL HIP ARTHROPLASTY Right 2008   "4-5 months after replacement" (04/21/2012)   TOTAL HIP ARTHROPLASTY Right 2008   TOTAL KNEE ARTHROPLASTY Left 2006   TOTAL KNEE ARTHROPLASTY Right 04/20/2012   TOTAL KNEE ARTHROPLASTY Right 04/20/2012   Procedure: TOTAL KNEE ARTHROPLASTY;  Surgeon: [name], MD;  Location: [place];  Service: Orthopedics;  Laterality: Right;  Right Total Knee Arthroplasty   Social History   Occupational History   Not on file  Tobacco Use   Smoking status: Current Every Day Smoker    Packs/day: 0.50    Years: 30.00    Pack years: 15.00    Types: Cigarettes   Smokeless tobacco: Never Used   Tobacco comment: thinking about quitting, talking with PCP  Substance and Sexual Activity   Alcohol use: Not Currently    Alcohol/week: 0.0 standard drinks   Drug use: Not Currently    Types: Cocaine    Comment: Cocaine and heroin - 10 years ago   Sexual activity: Yes    Birth control/protection: None

## 2019-03-16 ENCOUNTER — Other Ambulatory Visit: Payer: Self-pay | Admitting: Physician Assistant

## 2019-03-17 NOTE — Pre-Procedure Instructions (Signed)
Neyland Brunning  03/17/2019    Your procedure is scheduled on Wednesday, March 22, 2019 at 8:30 AM.   Report to [facility] Entrance "A" Admitting Office at 6:30 AM.   Call this number if you have problems the morning of surgery: [phone]   Questions prior to day of surgery, please call [phone] between 8 & 4 PM.   Remember:  Do not eat food after midnight Tuesday, 03/21/19.  You may drink clear liquids until 5:30 AM.  Clear liquids allowed are: Water, Juice (non-citric and without pulp), Carbonated beverages, Clear Tea, Black Coffee only, Plain Jell-O only, Gatorade and Plain Popsicles only   Drink the 10 ounce bottle of water by 5:30 AM day of surgery.    Take these medicines the morning of surgery with A SIP OF WATER: NONE  Do not use NSAIDS (Ibuprofen, Aleve, etc), Aspirin products (BC Powders, Goody's, etc), Multivitamins, Fish oil or Herbal medications prior to surgery.  Do not take Metformin the day of surgery.   How to Manage Your Diabetes Before Surgery   Why is it important to control my blood sugar before and after surgery?   Improving blood sugar levels before and after surgery helps healing and can limit problems.  A way of improving blood sugar control is eating a healthy diet by:  - Eating less sugar and carbohydrates  - Increasing activity/exercise  - Talk with your doctor about reaching your blood sugar goals  High blood sugars (greater than 180 mg/dL) can raise your risk of infections and slow down your recovery so you will need to focus on controlling your diabetes during the weeks before surgery.  Make sure that the doctor who takes care of your diabetes knows about your planned surgery including the date and location.  How do I manage my blood sugars before surgery?   Check your blood sugar at least 4 times a day, 2 days before surgery to make sure that they are not too high or low.  Check your blood sugar the morning of your surgery when  you wake up and every 2 hours until you get to the Short-Stay unit.  Treat a low blood sugar (less than 70 mg/dL) with 1/2 cup of clear juice (cranberry or apple), 4 glucose tablets, OR glucose gel.  Recheck blood sugar in 15 minutes after treatment (to make sure it is greater than 70 mg/dL).  If blood sugar is not greater than 70 mg/dL on re-check, call [phone] for further instructions.   Report your blood sugar to the Short-Stay nurse when you get to Short-Stay.  References:  [facility], 2007 "How to Manage your Diabetes Before and After Surgery".    Do not wear jewelry.  Do not wear lotions, powders, cologne or deodorant.  Men may shave face and neck.  Do not bring valuables to the hospital.  [facility] is not responsible for any belongings or valuables.  Contacts, dentures or bridgework may not be worn into surgery.  Leave your suitcase in the car.  After surgery it may be brought to your room.  For patients admitted to the hospital, discharge time will be determined by your treatment team.  [facility] - Preparing for Surgery  Before surgery, you can play an important role.  Because skin is not sterile, your skin needs to be as free of germs as possible.  You can reduce the number of germs on you skin by washing with CHG (chlorahexidine gluconate) soap  before surgery.  CHG is an antiseptic cleaner which kills germs and bonds with the skin to continue killing germs even after washing.  Oral Hygiene is also important in reducing the risk of infection.  Remember to brush your teeth with your regular toothpaste the morning of surgery.  Please DO NOT use if you have an allergy to CHG or antibacterial soaps.  If your skin becomes reddened/irritated stop using the CHG and inform your nurse when you arrive at Short Stay.  Do not shave (including legs and underarms) for at least 48 hours prior to the first CHG shower.  You may shave your face.  Please follow  these instructions carefully:   1.  Shower with CHG Soap the night before surgery and the morning of Surgery.  2.  If you choose to wash your hair, wash your hair first as usual with your normal shampoo.  3.  After you shampoo, rinse your hair and body thoroughly to remove the shampoo. 4.  Use CHG as you would any other liquid soap.  You can apply chg directly to the skin and wash gently with a      scrungie or washcloth.           5.  Apply the CHG Soap to your body ONLY FROM THE NECK DOWN.   Do not use on open wounds or open sores. Avoid contact with your eyes, ears, mouth and genitals (private parts).  Wash genitals (private parts) with your normal soap - do this prior to using the CHG soap.  6.  Wash thoroughly, paying special attention to the area where your surgery will be performed.  7.  Thoroughly rinse your body with warm water from the neck down.  8.  DO NOT shower/wash with your normal soap after using and rinsing off the CHG Soap.  9.  Pat yourself dry with a clean towel.            10.  Wear clean pajamas.            11.  Place clean sheets on your bed the night of your first shower and do not sleep with pets.  Day of Surgery  Shower as above. Do not apply any lotions/deoderants the morning of surgery.   Please wear clean clothes to the hospital/surgery center. Remember to brush your teeth with toothpaste.  Please read over the fact sheets that you were given.

## 2019-03-20 ENCOUNTER — Encounter (HOSPITAL_COMMUNITY)
Admission: RE | Admit: 2019-03-20 | Discharge: 2019-03-20 | Disposition: A | Discharge status: Home / Self Care | Payer: Medicare Other | Source: Ambulatory Visit | Attending: Orthopedic Surgery | Admitting: Orthopedic Surgery

## 2019-03-20 ENCOUNTER — Other Ambulatory Visit (HOSPITAL_COMMUNITY)
Admission: RE | Admit: 2019-03-20 | Discharge: 2019-03-20 | Disposition: A | Discharge status: Home / Self Care | Payer: Medicare Other | Source: Ambulatory Visit | Attending: Orthopedic Surgery | Admitting: Orthopedic Surgery

## 2019-03-20 ENCOUNTER — Encounter (HOSPITAL_COMMUNITY): Payer: Self-pay

## 2019-03-20 ENCOUNTER — Other Ambulatory Visit: Payer: Self-pay

## 2019-03-20 HISTORY — DX: Constipation, unspecified: K59.00

## 2019-03-20 HISTORY — DX: Anemia, unspecified: D64.9

## 2019-03-20 HISTORY — DX: Personal history of urinary calculi: Z87.442

## 2019-03-20 LAB — TYPE AND SCREEN
ABO/RH(D): O NEG
Antibody Screen: NEGATIVE

## 2019-03-20 LAB — HEMOGLOBIN A1C
Hgb A1c MFr Bld: 6.4 % — ABNORMAL HIGH (ref 4.8–5.6)
Mean Plasma Glucose: 136.98 mg/dL

## 2019-03-20 LAB — CBC
HCT: 45.9 % (ref 39.0–52.0)
Hemoglobin: 14.3 g/dL (ref 13.0–17.0)
MCH: 26.1 pg (ref 26.0–34.0)
MCHC: 31.2 g/dL (ref 30.0–36.0)
MCV: 83.8 fL (ref 80.0–100.0)
Platelets: 279 10*3/uL (ref 150–400)
RBC: 5.48 MIL/uL (ref 4.22–5.81)
RDW: 15.9 % — ABNORMAL HIGH (ref 11.5–15.5)
WBC: 11.1 10*3/uL — ABNORMAL HIGH (ref 4.0–10.5)
nRBC: 0 % (ref 0.0–0.2)

## 2019-03-20 LAB — COMPREHENSIVE METABOLIC PANEL
ALT: 13 U/L (ref 0–44)
AST: 18 U/L (ref 15–41)
Albumin: 4 g/dL (ref 3.5–5.0)
Alkaline Phosphatase: 68 U/L (ref 38–126)
Anion gap: 13 (ref 5–15)
BUN: 20 mg/dL (ref 6–20)
CO2: 20 mmol/L — ABNORMAL LOW (ref 22–32)
Calcium: 9.5 mg/dL (ref 8.9–10.3)
Chloride: 105 mmol/L (ref 98–111)
Creatinine, Ser: 1.02 mg/dL (ref 0.61–1.24)
GFR calc Af Amer: >60 mL/min (ref >60)
GFR calc non Af Amer: >60 mL/min (ref >60)
Glucose, Bld: 126 mg/dL — ABNORMAL HIGH (ref 70–99)
Potassium: 4.4 mmol/L (ref 3.5–5.1)
Sodium: 138 mmol/L (ref 135–145)
Total Bilirubin: 0.2 mg/dL — ABNORMAL LOW (ref 0.3–1.2)
Total Protein: 6.9 g/dL (ref 6.5–8.1)

## 2019-03-20 LAB — GLUCOSE, CAPILLARY: Glucose-Capillary: 127 mg/dL — ABNORMAL HIGH (ref 70–99)

## 2019-03-20 LAB — SURGICAL PCR SCREEN
MRSA, PCR: POSITIVE — AB
Staphylococcus aureus: POSITIVE — AB

## 2019-03-20 NOTE — Progress Notes (Addendum)
Anesthesia PAT Evaluation:  Case: [id number] Date/Time: 03/22/19 0815   Procedure: RIGHT TOTAL KNEE ARTHROPLASTY REVISION OF ALL COMPONENTS (Right Knee)   Anesthesia type: Choice   Pre-op diagnosis: Antibiotic Spacer Right Knee   Location: [facility] OR ROOM 03 / [facility]   Surgeons: [name], MD      DISCUSSION: Patient is a 54 year old male scheduled for the above procedure.  History includes smoking, DM2 (with neuropathy, diabetic foot ulcerations, osteomyelitis s/p right 2nd toe amputation 06/09/17; right 4th toe amputation 07/23/17; right TMA 06/15/18), GERD, CVA (possible 2008 during right THA hospitalization; he says he was drinking heavily at that time and doesn't actually remember if he did or did not have a CVA), chronic pain, left empyema (s/p left VATS, drainage, resection of LLL abscesses and decortication 07/09/05), right THA ( ~ 04/2006 at [facility]; with dislocation s/p closed reduction 06/11/06 & 08/31/06), right TKA (04/20/12; developed right knee PJI with MRSA, s/p debridement 11/18/18, s/p removal of right TKA and placement of anatomic antibiotic spacer 11/23/18), polysubstance abuse (including tobacco, ETOH, cocaine, heroin; last reported use of cocaine and heroin ~ 10 years ago but + cocaine on UDS 10/31/18, denied current ETOH).    - ED visit 12/31/18 for vascular access problem (slow flushing PICC). He was discharged on linezolid to finish last few days of antibiotic therapy. RUE PICC ultimately  discontinued  - Admission 11/16/18-12/01/18 with infected right prosthetic knee joint, sepsis, Staphylococcus aures bacteremia. Developed right knee swelling after a long drive to [place]. S/p right knee debridement 11/18/18 followed by removal of right TKA with placement of antibiotic spacer 11/23/18. Discharged home on IV Vancomycin via PICC. Discharge medications also included amlodipine 5 mg daily, carvedilol 25 mg BID, rosuvastatin 20 mg daily, pentoxifylline 400 mg daily which he is no longer taking.  -  Admission 10/31/18-11/01/18 for unresponsiveness due to OD oxycodone. Found unresponsive at home, improved with Narcan given by EMS, pill count suggested he had taken 104 tabs of oxycodone 10 mg in the previous 4 days, and UDS is also positive for benzodiazepines and cocaine. He required 2 more doses of Narcan in ED. He denied intentional intent to harm himself.   Patient seen at PAT due to reports of atypical left chest pain that he described as sharp and only lasting a second. He first noticed about 3 weeks ago when he developed some paresthesia in his LUE. He reported that historically has been very active--able to do work in the yard and on the roof; however, since he developed right knee infection following treatment for foot infection and long ride to [place] and required surgeries, he has had to use a walker. Shortly after his discharge home in November, he hit his left elbow on a chair and immediately felt pain down his arm.  He felt his left hand was numb and with decreased ability to use for about the next two months.  He had no other associated symptoms such as dysarthria, facial droop, leg weakness, or dysphagia.  Over the past several weeks he has noticed increased function and sensation in the left upper extremity and now notices some paresthesias in this left hand and soreness or tightness in his biceps. His left biceps feels persistently tight. As his sensation has improved, he also began to notice occasional 1 second sharp pains in his left chest without asscoiated symptoms such as diaphoresis, nausea, shortness of breath, palpitations, syncope.  Denies orthopnea or edema.  They can occur 3-4 times per  day, but only last about 1 second each. They are not associated with activity and nothing seems to make them worse or better.  Reportedly he talked with Dr. Sharol Given about his LUE symptoms and has been referred to Physical Medicine & Rehabilitation for LUE weakness. It also looks like Dondra Prader, NP  ordered a MRI c-spine in 09/2018, but this has not been done yet. He feels like his LUE is at 80% now. He is right handed.    A1c 6.4%. Home fasting CBGs approximately 115-1 20s.  BP 141/76. He is unsure why he was started on Coreg and amlodipine during his recent hospitalization, but says he has not had any recent issues with hypertension. Home BP readings ~ 110's/65.  Reportedly he was just seen by his PCP Dr. Cindie Laroche last month.  Preoperative EKG showed NSR. Echo 10/2018 showed LVEF > 65%. He described chest symptoms that only lasted for about a second, not associated with other symptoms and were non-exertional. His arm symptoms started after he hit his left elbow on a chair, and otherwise did not report any stroke-like symptoms. He apparently has a referral to PM&R, but has not been evaluated yet. (In review of orthopedic notes, it looks like LUE symptoms may have been longer than he initially told me--perhaps exacerbated after he hit his elbow--as there are notes from 09/2018 that mention ED visit at [facility] for left hand weakness with reported negative brain MRI. MRI c-spine was ordered then, but not done yet.) Chest symptoms described to me sounded atypical for cardiac etiology. Reviewed symptoms that might warrant immediate medical evaluation and recommended future follow-up with PCP or specialist for left arm paresthesias and weakness  Also discussed with anesthesiologist Hoy Morn, MD. Based on described symptoms, no additional preoperative recommendations given. Also discussed with patient refraining from illicit drugs (reiterates he has been clean) and avoid smoking for at least 24 hours prior to surgery.  03/20/19 presurgical COVID-19 test is in process.    VS: BP (!) 141/76   Pulse 82   Temp 36.8 C (Oral)   Resp 18   Ht 6\' 3"  (1.905 m)   Wt 78.3 kg   SpO2 98%   BMI 21.57 kg/m Patient is a pleasant Caucasian male in no acute distress.  He is alert and oriented x4. Speech clear.   He still feels like overall left upper extremity strength is about at 80% although grips seem strong and equal. Negative pronator drift.  Heart RRR, no murmur noted.  Lungs clear. No left chest wall tenderness. No significant ankle edema noted.  No carotid bruits noted.  He was using hospital wheelchair.   PROVIDERS: Lucia Gaskins, MD is PCP  Scharlene Gloss, MD is ID. On IV antibiotics (Vancomycin) through 01/03/19 for right PJI MRSA. Advised TKA after 01/18/19 should no new issues arise.   LABS: Labs reviewed: Acceptable for surgery. (all labs ordered are listed, but only abnormal results are displayed)  Labs Reviewed  SURGICAL PCR SCREEN - Abnormal; Notable for the following components:      Result Value   MRSA, PCR POSITIVE (*)    Staphylococcus aureus POSITIVE (*)    All other components within normal limits  GLUCOSE, CAPILLARY - Abnormal; Notable for the following components:   Glucose-Capillary 127 (*)    All other components within normal limits  HEMOGLOBIN A1C - Abnormal; Notable for the following components:   Hgb A1c MFr Bld 6.4 (*)    All other components within normal limits  COMPREHENSIVE  METABOLIC PANEL - Abnormal; Notable for the following components:   CO2 20 (*)    Glucose, Bld 126 (*)    Total Bilirubin 0.2 (*)    All other components within normal limits  CBC - Abnormal; Notable for the following components:   WBC 11.1 (*)    RDW 15.9 (*)    All other components within normal limits  TYPE AND SCREEN     IMAGES: 1V CXR 12/31/18: FINDINGS: Cardiac shadow is stable. The lungs are well aerated bilaterally. Chronic blunting of the left costophrenic angle is seen. No focal infiltrate or effusion is noted. PICC line tip is noted just above the cavoatrial junction in satisfactory position. By history the PICC line cannot be flushed. No bony abnormality is noted. IMPRESSION: - PICC line in satisfactory position. - No other focal abnormality is noted.  Chronic blunting of the left costophrenic angle is seen.  CT C-spine 11/03/15: IMPRESSION: - Minimal chronic small vessel ischemic disease of periventricular white matter. No acute intracranial abnormality. - No acute cervical spine abnormality. Degenerative disc disease C3-4 and C5-6 with bilateral neural foraminal encroachment from osteophytes. Chronic 3 mm retrolisthesis of C3 on C4 also reported in 2012 as 3 mm. 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 Left ventricular ejection fraction, by visual estimation, is 70 to  75%. The left ventricle has hyperdynamic function. There is mildly  increased left ventricular hypertrophy.  2. Indeterminate diastolic filling due to E-A fusion.  3. The left ventricle has no regional wall motion abnormalities.  4. Global right ventricle has normal systolic function.The right  ventricular size is normal. No increase in right ventricular wall  thickness.  5. Left atrial size was normal.  6. Right atrial size was normal.  7. Mild thickening of the anterior and posterior mitral valve leaflet(s).  8. The mitral valve is grossly normal. No evidence of mitral valve  regurgitation. No evidence of mitral stenosis.  9. The tricuspid valve is grossly normal. Tricuspid valve regurgitation  is not demonstrated.  10. The aortic valve is tricuspid. Aortic valve regurgitation is not  visualized. No evidence of aortic valve sclerosis or stenosis.  11. The pulmonic valve was grossly normal. Pulmonic valve regurgitation is  not visualized.  12. TR signal is inadequate for assessing pulmonary artery systolic  pressure.  13. The inferior vena cava is normal in size with greater than 50%  respiratory variability, suggesting right atrial pressure of 3 mmHg.  - In comparison to the previous echocardiogram(s): Prior examinations were  reviewed in a side by side comparison of  images. No change from prior.    He denied prior cardiac cath or or stress test.   Past Medical History:  Diagnosis Date  . Anemia    as a child  . Anxiety   . Arthritis    "everywhere" (04/21/2012)  . Cellulitis and abscess of foot 12/19/2013   LEFT FOOT  . Cellulitis of right foot   . Chronic pain   . Constipation   . DDD (degenerative disc disease)   . Depression    pt denies  . Diabetes mellitus without complication (HCC)    borderline  . Diabetic foot ulcer ([place])   . ETOH abuse   . Full dentures   . GERD (gastroesophageal reflux disease)    tums  . H/O amputation of lesser toe, right ([place]) 07/29/2017  . History of blood transfusion    "  related to left knee OR; probably right hip too" (04/21/2012)  . History of kidney stones   . Mental disorder   . MRSA (methicillin resistant staph aureus) culture positive   . Neuromuscular disorder (HCC)    neuropathy  . Noncompliance   . Open wound    bottom of foot  . Osteomyelitis ([place])    right 4th toe  . Osteomyelitis of right foot ([place])   . Pneumonia ~ 2012  . Polysubstance abuse (HCC)    etoh, cocaine, heroin  . Stroke [facility]) 2008   "they said I might have had one during right hip replacement" (04/21/2012)  . Wears glasses     Past Surgical History:  Procedure Laterality Date  . AMPUTATION Right 06/09/2017   Procedure: RIGHT 2ND TOE AMPUTATION;  Surgeon: [name], MD;  Location: [place];  Service: Orthopedics;  Laterality: Right;  . AMPUTATION Right 07/23/2017   Procedure: RIGHT 4TH TOE AMPUTATION;  Surgeon: [name], MD;  Location: [place];  Service: Orthopedics;  Laterality: Right;  . AMPUTATION Right 06/15/2018   Procedure: RIGHT FOOT TRANSMETATARSAL AMPUTATION;  Surgeon: [name], MD;  Location: [place];  Service: Orthopedics;  Laterality: Right;  right  . EXCISIONAL TOTAL KNEE ARTHROPLASTY WITH ANTIBIOTIC SPACERS Right 11/23/2018   Procedure: REMOVAL OF RIGHT TOTAL KNEE ARTHROPLASTY AND PLACEMENT OF ANATOMIC  ANTIBIOTIC SPACER;  Surgeon: [name], MD;  Location: [place];  Service: Orthopedics;  Laterality: Right;  . I & D EXTREMITY Right 11/18/2018   Procedure: RIGHT KNEE DEBRIDEMENT;  Surgeon: [name], MD;  Location: [place];  Service: Orthopedics;  Laterality: Right;  . JOINT REPLACEMENT    . KNEE ARTHROSCOPY Bilateral 1980's/1990's  . LUNG LOBECTOMY Left ~ 2006  . LUNG LOBECTOMY    . METATARSAL OSTEOTOMY  10/29/2011   Procedure: METATARSAL OSTEOTOMY;  Surgeon: [name], MD;  Location: [place];  Service: Orthopedics;  Laterality: Left;  Left 1st Metatarsal Dorsal Closing Wedge   . METATARSAL OSTEOTOMY Right 04/28/2017   Procedure: RIGHT 1ST METATARSAL DORSAL CLOSING WEDGE OSTEOTOMY;  Surgeon: [name], MD;  Location: [place];  Service: Orthopedics;  Laterality: Right;  . MULTIPLE TOOTH EXTRACTIONS    . REVISION TOTAL HIP ARTHROPLASTY Right 2008   "4-5 months after replacement" (04/21/2012)  . TOTAL HIP ARTHROPLASTY Right 2008  . TOTAL KNEE ARTHROPLASTY Left 2006  . TOTAL KNEE ARTHROPLASTY Right 04/20/2012  . TOTAL KNEE ARTHROPLASTY Right 04/20/2012   Procedure: TOTAL KNEE ARTHROPLASTY;  Surgeon: [name], MD;  Location: [place];  Service: Orthopedics;  Laterality: Right;  Right Total Knee Arthroplasty    MEDICATIONS: . ibuprofen (ADVIL) 200 MG tablet  . Oxycodone HCl 10 MG TABS  . amLODipine (NORVASC) 5 MG tablet  . carvedilol (COREG) 25 MG tablet  . metFORMIN (GLUCOPHAGE) 500 MG tablet  . nicotine (NICODERM CQ - DOSED IN MG/24 HOURS) 21 mg/24hr patch  . nitroGLYCERIN (NITRODUR - DOSED IN MG/24 HR) 0.2 mg/hr patch  . oxyCODONE-acetaminophen (PERCOCET/ROXICET) 5-325 MG tablet  . pentoxifylline (TRENTAL) 400 MG CR tablet  . polyethylene glycol (MIRALAX / GLYCOLAX) 17 g packet  . rosuvastatin (CRESTOR) 20 MG tablet  . senna-docusate (SENOKOT-S) 8.6-50 MG tablet  . silver sulfADIAZINE (SILVADENE) 1 % cream  . vancomycin IVPB   No current facility-administered medications for  this encounter.  He is not currently taking amlodipine, Coreg, NitroDur 0.2 mg, Percocet, Trental, Crestor, Senokot, vancomycin. (Nitro was prescribed by orthopedics to promote vasodilation/improve RLE  circulation for wound healing.)    Myra Gianotti, PA-C Surgical Short Stay/Anesthesiology [facility] Phone [phone] [facility] Phone [phone] 03/20/2019 10:10 PM

## 2019-03-20 NOTE — Progress Notes (Signed)
PCP - Dr. Dia Sitter Cardiologist - denies  Chest x-ray - 12/31/18 - 1 view EKG - today Stress Test - denies ECHO - 11/17/18 Cardiac Cath - denies  Fasting Blood Sugar - around 120  Checks Blood Sugar __1___ times a day CBG - 127 Last A1C was 6.4 on 10/31/18  ERAS Protcol - yes PRE-SURGERY Ensure or G2- 10 ounce bottle of water  COVID TEST- will be done today   Anesthesia review: yes, pt having chest pain and left arm pain intermittently for past 3 weeks. Had some during PAT appt. Describes it as tightness in his chest and numbness in his left arm. He states it only lasts a few seconds, denies nausea but states it sometimes makes him feel light headed. Denies diaphoresis. States he's usually at rest when it occurs.   Pt states he saw Dr. Dia Sitter the first of February. Pt had been hospitalized in November 2020 and when discharged was given prescriptions for several medications. He states he took them until they were gone and hasn't had them since. Only medications he's taking are his Metformin, Oxycodone and Miralax. When asked if Dr. Dia Sitter said anything about his not taking his medications, pt states "he probably doesn't know anything about them, I didn't tell him about them." EKG done due to chest pain complaint.    All instructions explained to the patient, with a verbal understanding of the material. Patient agrees to go over the instructions while at home for a better understanding. Patient also instructed to self quarantine after being tested for COVID-19. The opportunity to ask questions was provided.

## 2019-03-21 LAB — SARS CORONAVIRUS 2 (TAT 6-24 HRS): SARS Coronavirus 2: NEGATIVE

## 2019-03-21 NOTE — Anesthesia Preprocedure Evaluation (Addendum)
Anesthesia Evaluation  Patient identified by MRN, date of birth, ID band Patient awake    Reviewed: Allergy & Precautions, H&P , NPO status , Patient's Chart, lab work & pertinent test results  Airway Mallampati: II  TM Distance: >3 FB Neck ROM: Full    Dental no notable dental hx. (+) Edentulous Upper, Edentulous Lower, Dental Advisory Given   Pulmonary neg pulmonary ROS, Current Smoker and Patient abstained from smoking.,    Pulmonary exam normal breath sounds clear to auscultation       Cardiovascular Exercise Tolerance: Good negative cardio ROS   Rhythm:Regular Rate:Normal     Neuro/Psych Anxiety Depression CVA, No Residual Symptoms    GI/Hepatic Neg liver ROS, GERD  ,(+)     substance abuse  ,   Endo/Other  diabetes, Type 2, Oral Hypoglycemic Agents  Renal/GU negative Renal ROS  negative genitourinary   Musculoskeletal  (+) Arthritis , Osteoarthritis,    Abdominal   Peds  Hematology  (+) Blood dyscrasia, anemia ,   Anesthesia Other Findings   Reproductive/Obstetrics negative OB ROS                            Anesthesia Physical Anesthesia Plan  ASA: III  Anesthesia Plan: Spinal and MAC   Post-op Pain Management:  Regional for Post-op pain   Induction: Intravenous  PONV Risk Score and Plan: 1 and Ondansetron, Midazolam and Propofol infusion  Airway Management Planned: Simple Face Mask  Additional Equipment:   Intra-op Plan:   Post-operative Plan: Extubation in OR  Informed Consent: I have reviewed the patients History and Physical, chart, labs and discussed the procedure including the risks, benefits and alternatives for the proposed anesthesia with the patient or authorized representative who has indicated his/her understanding and acceptance.     Dental advisory given  Plan Discussed with: CRNA  Anesthesia Plan Comments: (PAT note written by Myra Gianotti,  PA-C. )       Anesthesia Quick Evaluation

## 2019-03-22 ENCOUNTER — Inpatient Hospital Stay (HOSPITAL_COMMUNITY): Payer: Medicare Other | Admitting: Anesthesiology

## 2019-03-22 ENCOUNTER — Encounter (HOSPITAL_COMMUNITY): Payer: Self-pay | Admitting: Orthopedic Surgery

## 2019-03-22 ENCOUNTER — Inpatient Hospital Stay (HOSPITAL_COMMUNITY)
Admission: RE | Admit: 2019-03-22 | Discharge: 2019-03-25 | DRG: 467 | Disposition: A | Discharge status: Home / Self Care | Payer: Medicare Other | Attending: Orthopedic Surgery | Admitting: Orthopedic Surgery

## 2019-03-22 ENCOUNTER — Other Ambulatory Visit: Payer: Self-pay

## 2019-03-22 ENCOUNTER — Encounter (HOSPITAL_COMMUNITY)
Admission: RE | Disposition: A | Discharge status: Home / Self Care | Payer: Self-pay | Source: Ambulatory Visit | Attending: Orthopedic Surgery

## 2019-03-22 ENCOUNTER — Inpatient Hospital Stay (HOSPITAL_COMMUNITY): Payer: Medicare Other | Admitting: Vascular Surgery

## 2019-03-22 DIAGNOSIS — Y792 Prosthetic and other implants, materials and accessory orthopedic devices associated with adverse incidents: Secondary | ICD-10-CM | POA: Diagnosis present

## 2019-03-22 DIAGNOSIS — Y831 Surgical operation with implant of artificial internal device as the cause of abnormal reaction of the patient, or of later complication, without mention of misadventure at the time of the procedure: Secondary | ICD-10-CM | POA: Diagnosis present

## 2019-03-22 DIAGNOSIS — E1142 Type 2 diabetes mellitus with diabetic polyneuropathy: Secondary | ICD-10-CM | POA: Diagnosis present

## 2019-03-22 DIAGNOSIS — K5900 Constipation, unspecified: Secondary | ICD-10-CM | POA: Diagnosis present

## 2019-03-22 DIAGNOSIS — Z833 Family history of diabetes mellitus: Secondary | ICD-10-CM | POA: Diagnosis not present

## 2019-03-22 DIAGNOSIS — T84092A Other mechanical complication of internal right knee prosthesis, initial encounter: Secondary | ICD-10-CM | POA: Diagnosis present

## 2019-03-22 DIAGNOSIS — Z96651 Presence of right artificial knee joint: Secondary | ICD-10-CM | POA: Diagnosis not present

## 2019-03-22 DIAGNOSIS — L97819 Non-pressure chronic ulcer of other part of right lower leg with unspecified severity: Secondary | ICD-10-CM | POA: Diagnosis present

## 2019-03-22 DIAGNOSIS — Z96652 Presence of left artificial knee joint: Secondary | ICD-10-CM | POA: Diagnosis present

## 2019-03-22 DIAGNOSIS — D649 Anemia, unspecified: Secondary | ICD-10-CM | POA: Diagnosis present

## 2019-03-22 DIAGNOSIS — E440 Moderate protein-calorie malnutrition: Secondary | ICD-10-CM | POA: Insufficient documentation

## 2019-03-22 DIAGNOSIS — F419 Anxiety disorder, unspecified: Secondary | ICD-10-CM | POA: Diagnosis present

## 2019-03-22 DIAGNOSIS — T84012S Broken internal right knee prosthesis, sequela: Secondary | ICD-10-CM

## 2019-03-22 DIAGNOSIS — E11621 Type 2 diabetes mellitus with foot ulcer: Secondary | ICD-10-CM | POA: Diagnosis present

## 2019-03-22 DIAGNOSIS — F17210 Nicotine dependence, cigarettes, uncomplicated: Secondary | ICD-10-CM | POA: Diagnosis present

## 2019-03-22 DIAGNOSIS — F1010 Alcohol abuse, uncomplicated: Secondary | ICD-10-CM | POA: Diagnosis present

## 2019-03-22 DIAGNOSIS — L97529 Non-pressure chronic ulcer of other part of left foot with unspecified severity: Secondary | ICD-10-CM | POA: Diagnosis present

## 2019-03-22 DIAGNOSIS — Z8614 Personal history of Methicillin resistant Staphylococcus aureus infection: Secondary | ICD-10-CM

## 2019-03-22 DIAGNOSIS — Z7984 Long term (current) use of oral hypoglycemic drugs: Secondary | ICD-10-CM | POA: Diagnosis not present

## 2019-03-22 DIAGNOSIS — T8453XA Infection and inflammatory reaction due to internal right knee prosthesis, initial encounter: Secondary | ICD-10-CM | POA: Diagnosis not present

## 2019-03-22 DIAGNOSIS — Z8673 Personal history of transient ischemic attack (TIA), and cerebral infarction without residual deficits: Secondary | ICD-10-CM

## 2019-03-22 DIAGNOSIS — Z96641 Presence of right artificial hip joint: Secondary | ICD-10-CM | POA: Diagnosis present

## 2019-03-22 DIAGNOSIS — Z20822 Contact with and (suspected) exposure to covid-19: Secondary | ICD-10-CM | POA: Diagnosis present

## 2019-03-22 DIAGNOSIS — Z6821 Body mass index (BMI) 21.0-21.9, adult: Secondary | ICD-10-CM

## 2019-03-22 DIAGNOSIS — F329 Major depressive disorder, single episode, unspecified: Secondary | ICD-10-CM | POA: Diagnosis present

## 2019-03-22 DIAGNOSIS — M1990 Unspecified osteoarthritis, unspecified site: Secondary | ICD-10-CM | POA: Diagnosis present

## 2019-03-22 DIAGNOSIS — Z9119 Patient's noncompliance with other medical treatment and regimen: Secondary | ICD-10-CM

## 2019-03-22 DIAGNOSIS — Z79899 Other long term (current) drug therapy: Secondary | ICD-10-CM | POA: Diagnosis not present

## 2019-03-22 DIAGNOSIS — Z89421 Acquired absence of other right toe(s): Secondary | ICD-10-CM | POA: Diagnosis not present

## 2019-03-22 DIAGNOSIS — K219 Gastro-esophageal reflux disease without esophagitis: Secondary | ICD-10-CM | POA: Diagnosis present

## 2019-03-22 DIAGNOSIS — Z888 Allergy status to other drugs, medicaments and biological substances status: Secondary | ICD-10-CM

## 2019-03-22 DIAGNOSIS — T84012A Broken internal right knee prosthesis, initial encounter: Secondary | ICD-10-CM

## 2019-03-22 HISTORY — PX: TOTAL KNEE REVISION: SHX996

## 2019-03-22 LAB — GLUCOSE, CAPILLARY
Glucose-Capillary: 118 mg/dL — ABNORMAL HIGH (ref 70–99)
Glucose-Capillary: 122 mg/dL — ABNORMAL HIGH (ref 70–99)
Glucose-Capillary: 144 mg/dL — ABNORMAL HIGH (ref 70–99)
Glucose-Capillary: 175 mg/dL — ABNORMAL HIGH (ref 70–99)

## 2019-03-22 LAB — HEMOGLOBIN AND HEMATOCRIT, BLOOD
HCT: 30 % — ABNORMAL LOW (ref 39.0–52.0)
Hemoglobin: 9.3 g/dL — ABNORMAL LOW (ref 13.0–17.0)

## 2019-03-22 SURGERY — TOTAL KNEE REVISION
Anesthesia: Monitor Anesthesia Care | Site: Knee | Laterality: Right

## 2019-03-22 MED ORDER — METHOCARBAMOL 1000 MG/10ML IJ SOLN
500.0000 mg | Freq: Four times a day (QID) | INTRAVENOUS | Status: DC | PRN
  Filled 2019-03-22: qty 5

## 2019-03-22 MED ORDER — ALBUMIN HUMAN 5 % IV SOLN: 12.5000 g | Freq: Once | INTRAVENOUS | Status: AC

## 2019-03-22 MED ORDER — BUPIVACAINE HCL (PF) 0.5 % IJ SOLN
INTRAMUSCULAR | Status: DC | PRN
  Administered 2019-03-22: 20 mL via PERINEURAL

## 2019-03-22 MED ORDER — SILVER SULFADIAZINE 1 % EX CREA
1.0000 "application " | TOPICAL_CREAM | Freq: Every day | CUTANEOUS | Status: DC
  Administered 2019-03-24 – 2019-03-25 (×2): 1 via TOPICAL
  Filled 2019-03-22: qty 85

## 2019-03-22 MED ORDER — HYDROMORPHONE HCL 1 MG/ML IJ SOLN
INTRAMUSCULAR | Status: AC
  Administered 2019-03-22: 0.5 mg via INTRAVENOUS
  Filled 2019-03-22: qty 1

## 2019-03-22 MED ORDER — OXYCODONE HCL 5 MG PO TABS
5.0000 mg | ORAL_TABLET | ORAL | Status: DC | PRN
  Administered 2019-03-22 – 2019-03-23 (×4): 10 mg via ORAL
  Filled 2019-03-22 (×6): qty 2

## 2019-03-22 MED ORDER — PHENYLEPHRINE 40 MCG/ML (10ML) SYRINGE FOR IV PUSH (FOR BLOOD PRESSURE SUPPORT)
PREFILLED_SYRINGE | INTRAVENOUS | Status: DC | PRN
  Administered 2019-03-22: 80 ug via INTRAVENOUS
  Administered 2019-03-22 (×2): 120 ug via INTRAVENOUS
  Administered 2019-03-22: 80 ug via INTRAVENOUS

## 2019-03-22 MED ORDER — MIDAZOLAM HCL 2 MG/2ML IJ SOLN
INTRAMUSCULAR | Status: AC
  Filled 2019-03-22: qty 2

## 2019-03-22 MED ORDER — ONDANSETRON HCL 4 MG/2ML IJ SOLN
INTRAMUSCULAR | Status: DC | PRN
  Administered 2019-03-22: 4 mg via INTRAVENOUS

## 2019-03-22 MED ORDER — PHENOL 1.4 % MT LIQD: 1.0000 | OROMUCOSAL | Status: DC | PRN

## 2019-03-22 MED ORDER — GABAPENTIN 300 MG PO CAPS
300.0000 mg | ORAL_CAPSULE | Freq: Three times a day (TID) | ORAL | Status: DC
  Administered 2019-03-22 – 2019-03-25 (×9): 300 mg via ORAL
  Filled 2019-03-22 (×9): qty 1

## 2019-03-22 MED ORDER — VANCOMYCIN HCL IN DEXTROSE 1-5 GM/200ML-% IV SOLN
1000.0000 mg | Freq: Once | INTRAVENOUS | Status: AC
  Administered 2019-03-22: 1000 mg via INTRAVENOUS
  Filled 2019-03-22: qty 200

## 2019-03-22 MED ORDER — MENTHOL 3 MG MT LOZG: 1.0000 | LOZENGE | OROMUCOSAL | Status: DC | PRN

## 2019-03-22 MED ORDER — ASPIRIN EC 325 MG PO TBEC
325.0000 mg | DELAYED_RELEASE_TABLET | Freq: Every day | ORAL | Status: DC
  Administered 2019-03-23 – 2019-03-25 (×3): 325 mg via ORAL
  Filled 2019-03-22 (×3): qty 1

## 2019-03-22 MED ORDER — PROPOFOL 10 MG/ML IV BOLUS
INTRAVENOUS | Status: AC
  Filled 2019-03-22: qty 20

## 2019-03-22 MED ORDER — METHOCARBAMOL 500 MG PO TABS
ORAL_TABLET | ORAL | Status: AC
  Administered 2019-03-22: 500 mg via ORAL
  Filled 2019-03-22: qty 1

## 2019-03-22 MED ORDER — TRANEXAMIC ACID-NACL 1000-0.7 MG/100ML-% IV SOLN
INTRAVENOUS | Status: AC
  Filled 2019-03-22: qty 100

## 2019-03-22 MED ORDER — TRANEXAMIC ACID-NACL 1000-0.7 MG/100ML-% IV SOLN
INTRAVENOUS | Status: DC | PRN
  Administered 2019-03-22: 1000 mg via INTRAVENOUS

## 2019-03-22 MED ORDER — TRANEXAMIC ACID 1000 MG/10ML IV SOLN
INTRAVENOUS | Status: DC | PRN
  Administered 2019-03-22: 2000 mg via TOPICAL

## 2019-03-22 MED ORDER — SODIUM CHLORIDE 0.9 % IR SOLN
Status: DC | PRN
  Administered 2019-03-22: 3000 mL

## 2019-03-22 MED ORDER — ALBUMIN HUMAN 5 % IV SOLN: INTRAVENOUS | Status: DC | PRN

## 2019-03-22 MED ORDER — PROPOFOL 10 MG/ML IV BOLUS
INTRAVENOUS | Status: DC | PRN
  Administered 2019-03-22: 20 mg via INTRAVENOUS

## 2019-03-22 MED ORDER — METHOCARBAMOL 500 MG PO TABS
500.0000 mg | ORAL_TABLET | Freq: Four times a day (QID) | ORAL | Status: DC | PRN
  Administered 2019-03-24: 500 mg via ORAL
  Filled 2019-03-22: qty 1

## 2019-03-22 MED ORDER — METOCLOPRAMIDE HCL 5 MG/ML IJ SOLN: 5.0000 mg | Freq: Three times a day (TID) | INTRAMUSCULAR | Status: DC | PRN

## 2019-03-22 MED ORDER — ONDANSETRON HCL 4 MG/2ML IJ SOLN: 4.0000 mg | Freq: Four times a day (QID) | INTRAMUSCULAR | Status: DC | PRN

## 2019-03-22 MED ORDER — PHENYLEPHRINE HCL-NACL 10-0.9 MG/250ML-% IV SOLN
INTRAVENOUS | Status: DC | PRN
  Administered 2019-03-22: 50 ug/min via INTRAVENOUS

## 2019-03-22 MED ORDER — METOCLOPRAMIDE HCL 5 MG PO TABS: 5.0000 mg | ORAL_TABLET | Freq: Three times a day (TID) | ORAL | Status: DC | PRN

## 2019-03-22 MED ORDER — MIDAZOLAM HCL 2 MG/2ML IJ SOLN
INTRAMUSCULAR | Status: DC | PRN
  Administered 2019-03-22: 2 mg via INTRAVENOUS

## 2019-03-22 MED ORDER — ONDANSETRON HCL 4 MG/2ML IJ SOLN
INTRAMUSCULAR | Status: AC
  Filled 2019-03-22: qty 2

## 2019-03-22 MED ORDER — PROPOFOL 500 MG/50ML IV EMUL
INTRAVENOUS | Status: DC | PRN
  Administered 2019-03-22: 80 ug/kg/min via INTRAVENOUS
  Administered 2019-03-22: 125 ug/kg/min via INTRAVENOUS

## 2019-03-22 MED ORDER — EPHEDRINE 5 MG/ML INJ
INTRAVENOUS | Status: AC
  Filled 2019-03-22: qty 10

## 2019-03-22 MED ORDER — ACETAMINOPHEN 500 MG PO TABS
1000.0000 mg | ORAL_TABLET | Freq: Once | ORAL | Status: AC
  Administered 2019-03-22: 1000 mg via ORAL
  Filled 2019-03-22: qty 2

## 2019-03-22 MED ORDER — CEFAZOLIN SODIUM-DEXTROSE 2-4 GM/100ML-% IV SOLN
2.0000 g | INTRAVENOUS | Status: AC
  Administered 2019-03-22: 09:00:00 2 g via INTRAVENOUS
  Filled 2019-03-22: qty 100

## 2019-03-22 MED ORDER — OXYCODONE HCL 5 MG PO TABS
ORAL_TABLET | ORAL | Status: AC
  Filled 2019-03-22: qty 1

## 2019-03-22 MED ORDER — ONDANSETRON HCL 4 MG PO TABS: 4.0000 mg | ORAL_TABLET | Freq: Four times a day (QID) | ORAL | Status: DC | PRN

## 2019-03-22 MED ORDER — ALBUMIN HUMAN 5 % IV SOLN
INTRAVENOUS | Status: AC
  Administered 2019-03-22: 12.5 g via INTRAVENOUS
  Filled 2019-03-22: qty 250

## 2019-03-22 MED ORDER — CEFAZOLIN SODIUM-DEXTROSE 1-4 GM/50ML-% IV SOLN
1.0000 g | Freq: Four times a day (QID) | INTRAVENOUS | Status: AC
  Administered 2019-03-22 (×2): 1 g via INTRAVENOUS
  Filled 2019-03-22 (×3): qty 50

## 2019-03-22 MED ORDER — ACETAMINOPHEN 325 MG PO TABS: 325.0000 mg | ORAL_TABLET | Freq: Four times a day (QID) | ORAL | Status: DC | PRN

## 2019-03-22 MED ORDER — INSULIN ASPART 100 UNIT/ML ~~LOC~~ SOLN
0.0000 [IU] | Freq: Three times a day (TID) | SUBCUTANEOUS | Status: DC
  Administered 2019-03-22: 1 [IU] via SUBCUTANEOUS
  Administered 2019-03-23: 3 [IU] via SUBCUTANEOUS
  Administered 2019-03-23 – 2019-03-24 (×2): 2 [IU] via SUBCUTANEOUS
  Administered 2019-03-24: 3 [IU] via SUBCUTANEOUS
  Administered 2019-03-24 – 2019-03-25 (×2): 2 [IU] via SUBCUTANEOUS

## 2019-03-22 MED ORDER — CHLORHEXIDINE GLUCONATE 4 % EX LIQD: 60.0000 mL | Freq: Once | CUTANEOUS | Status: DC

## 2019-03-22 MED ORDER — FENTANYL CITRATE (PF) 250 MCG/5ML IJ SOLN
INTRAMUSCULAR | Status: DC | PRN
  Administered 2019-03-22: 100 ug via INTRAVENOUS
  Administered 2019-03-22: 50 ug via INTRAVENOUS

## 2019-03-22 MED ORDER — OXYCODONE HCL 5 MG PO TABS
10.0000 mg | ORAL_TABLET | ORAL | Status: DC | PRN
  Administered 2019-03-22: 5 mg via ORAL
  Administered 2019-03-22: 10 mg via ORAL
  Administered 2019-03-23: 15 mg via ORAL
  Administered 2019-03-23: 10 mg via ORAL
  Administered 2019-03-24 – 2019-03-25 (×7): 15 mg via ORAL
  Filled 2019-03-22 (×8): qty 3

## 2019-03-22 MED ORDER — EPHEDRINE SULFATE 50 MG/ML IJ SOLN
INTRAMUSCULAR | Status: DC | PRN
  Administered 2019-03-22: 10 mg via INTRAVENOUS
  Administered 2019-03-22: 5 mg via INTRAVENOUS

## 2019-03-22 MED ORDER — OXYCODONE HCL 5 MG PO TABS
ORAL_TABLET | ORAL | Status: AC
  Administered 2019-03-22: 10 mg via ORAL
  Filled 2019-03-22: qty 2

## 2019-03-22 MED ORDER — HYDROMORPHONE HCL 1 MG/ML IJ SOLN
0.2500 mg | INTRAMUSCULAR | Status: DC | PRN
  Administered 2019-03-22 (×2): 0.5 mg via INTRAVENOUS

## 2019-03-22 MED ORDER — TRANEXAMIC ACID 1000 MG/10ML IV SOLN
2000.0000 mg | Freq: Once | INTRAVENOUS | Status: DC
  Filled 2019-03-22: qty 20

## 2019-03-22 MED ORDER — FENTANYL CITRATE (PF) 250 MCG/5ML IJ SOLN
INTRAMUSCULAR | Status: AC
  Filled 2019-03-22: qty 5

## 2019-03-22 MED ORDER — LACTATED RINGERS IV SOLN: INTRAVENOUS | Status: DC

## 2019-03-22 MED ORDER — HYDROMORPHONE HCL 1 MG/ML IJ SOLN
INTRAMUSCULAR | Status: AC
  Filled 2019-03-22: qty 1

## 2019-03-22 MED ORDER — DOCUSATE SODIUM 100 MG PO CAPS
100.0000 mg | ORAL_CAPSULE | Freq: Two times a day (BID) | ORAL | Status: DC
  Administered 2019-03-22 – 2019-03-25 (×6): 100 mg via ORAL
  Filled 2019-03-22 (×6): qty 1

## 2019-03-22 MED ORDER — BUPIVACAINE HCL (PF) 0.25 % IJ SOLN
INTRAMUSCULAR | Status: AC
  Filled 2019-03-22: qty 30

## 2019-03-22 MED ORDER — HYDROMORPHONE HCL 1 MG/ML IJ SOLN
0.5000 mg | INTRAMUSCULAR | Status: DC | PRN
  Administered 2019-03-22 – 2019-03-23 (×2): 1 mg via INTRAVENOUS
  Administered 2019-03-23: 0.5 mg via INTRAVENOUS
  Administered 2019-03-24 (×2): 1 mg via INTRAVENOUS
  Administered 2019-03-24: 0.5 mg via INTRAVENOUS
  Administered 2019-03-24 – 2019-03-25 (×3): 1 mg via INTRAVENOUS
  Administered 2019-03-25: 0.5 mg via INTRAVENOUS
  Filled 2019-03-22 (×10): qty 1

## 2019-03-22 MED ORDER — SODIUM CHLORIDE 0.9 % IV SOLN: INTRAVENOUS | Status: DC

## 2019-03-22 MED ORDER — METFORMIN HCL 500 MG PO TABS
500.0000 mg | ORAL_TABLET | Freq: Two times a day (BID) | ORAL | Status: DC
  Administered 2019-03-22 – 2019-03-25 (×6): 500 mg via ORAL
  Filled 2019-03-22 (×6): qty 1

## 2019-03-22 MED ORDER — 0.9 % SODIUM CHLORIDE (POUR BTL) OPTIME
TOPICAL | Status: DC | PRN
  Administered 2019-03-22: 10:00:00 1000 mL

## 2019-03-22 SURGICAL SUPPLY — 75 items
AUG FEM SZ8 4 REV DIST STRL LF (Miscellaneous) ×2 IMPLANT
AUG FEM SZ8 8 REV POST STRL LF (Miscellaneous) ×2 IMPLANT
AUG POST FEM KNEE SZ8 8 (Miscellaneous) ×6 IMPLANT
AUG TIB 5/6 10 REV CMNT LM/RL (Orthopedic Implant) ×1 IMPLANT
AUG TIB 5/6 10 REV CMNT RM/LL (Orthopedic Implant) ×1 IMPLANT
AUG TIB REV LM/RL SZ 5/6 10 (Orthopedic Implant) ×3 IMPLANT
AUG TIB REV RM/LL SZ 5/6 10 (Orthopedic Implant) ×3 IMPLANT
AUGMENT DISTAL FEM SZ8 4  KNEE (Miscellaneous) ×6 IMPLANT
AUGMENT DISTAL FEM SZ8 4 KNEE (Miscellaneous) IMPLANT
AUGMENT POST FEM KNEE SZ8 8 (Miscellaneous) IMPLANT
AUGMENT TIB REV LM/RL SZ5/6 10 (Orthopedic Implant) IMPLANT
AUGMENT TIB REV RM/LL SZ /6 10 (Orthopedic Implant) IMPLANT
BASE TIB KNEE REV RP ATUNE SZ6 (Knees) ×2 IMPLANT
BLADE SAW SAG 90X13X1.27 (BLADE) ×3 IMPLANT
BLADE SAW SGTL 13.0X1.19X90.0M (BLADE) ×3 IMPLANT
BNDG COHESIVE 6X5 TAN STRL LF (GAUZE/BANDAGES/DRESSINGS) ×6 IMPLANT
BOWL SMART MIX CTS (DISPOSABLE) IMPLANT
BSPLAT TIB 6 CMNT REV ROT PLAT (Knees) ×1 IMPLANT
CEMENT BONE REFOBACIN R1X40 US (Cement) ×4 IMPLANT
COMP FEM ATTUNE CRS SZ8 RT (Femur) ×3 IMPLANT
COMPONENT FEM ATN CRS SZ8 RT (Femur) IMPLANT
COVER BACK TABLE 24X17X13 BIG (DRAPES) IMPLANT
COVER SURGICAL LIGHT HANDLE (MISCELLANEOUS) ×6 IMPLANT
COVER WAND RF STERILE (DRAPES) ×3 IMPLANT
CUFF TOURN SGL QUICK 34 (TOURNIQUET CUFF)
CUFF TOURN SGL QUICK 42 (TOURNIQUET CUFF) IMPLANT
CUFF TRNQT CYL 34X4.125X (TOURNIQUET CUFF) IMPLANT
DRAPE HALF SHEET 40X57 (DRAPES) ×6 IMPLANT
DRAPE IMP U-DRAPE 54X76 (DRAPES) ×3 IMPLANT
DRAPE U-SHAPE 47X51 STRL (DRAPES) ×3 IMPLANT
DRSG ADAPTIC 3X8 NADH LF (GAUZE/BANDAGES/DRESSINGS) ×3 IMPLANT
DRSG PAD ABDOMINAL 8X10 ST (GAUZE/BANDAGES/DRESSINGS) ×3 IMPLANT
DURAPREP 26ML APPLICATOR (WOUND CARE) ×3 IMPLANT
ELECT REM PT RETURN 9FT ADLT (ELECTROSURGICAL) ×3
ELECTRODE REM PT RTRN 9FT ADLT (ELECTROSURGICAL) ×1 IMPLANT
FACESHIELD WRAPAROUND (MASK) ×3 IMPLANT
FACESHIELD WRAPAROUND OR TEAM (MASK) ×1 IMPLANT
GAUZE SPONGE 4X4 12PLY STRL (GAUZE/BANDAGES/DRESSINGS) ×3 IMPLANT
GLOVE BIOGEL PI IND STRL 9 (GLOVE) ×1 IMPLANT
GLOVE BIOGEL PI INDICATOR 9 (GLOVE) ×2
GLOVE SURG ORTHO 9.0 STRL STRW (GLOVE) ×3 IMPLANT
GOWN STRL REUS W/ TWL XL LVL3 (GOWN DISPOSABLE) ×3 IMPLANT
GOWN STRL REUS W/TWL XL LVL3 (GOWN DISPOSABLE) ×9
HANDPIECE INTERPULSE COAX TIP (DISPOSABLE)
INSERT TIB CRS ATTUNE SZ8 18 (Insert) ×2 IMPLANT
KIT BASIN OR (CUSTOM PROCEDURE TRAY) ×3 IMPLANT
KIT TURNOVER KIT B (KITS) ×3 IMPLANT
MANIFOLD NEPTUNE II (INSTRUMENTS) ×3 IMPLANT
NDL SPNL 18GX3.5 QUINCKE PK (NEEDLE) ×1 IMPLANT
NEEDLE SPNL 18GX3.5 QUINCKE PK (NEEDLE) ×3 IMPLANT
NS IRRIG 1000ML POUR BTL (IV SOLUTION) ×3 IMPLANT
PACK TOTAL JOINT (CUSTOM PROCEDURE TRAY) ×3 IMPLANT
PACK UNIVERSAL I (CUSTOM PROCEDURE TRAY) ×3 IMPLANT
PAD ARMBOARD 7.5X6 YLW CONV (MISCELLANEOUS) ×6 IMPLANT
PADDING CAST COTTON 6X4 STRL (CAST SUPPLIES) ×3 IMPLANT
PIN STEINMAN FIXATION KNEE (PIN) ×2 IMPLANT
PREVENA RESTOR AXIOFORM 29X28 (GAUZE/BANDAGES/DRESSINGS) ×2 IMPLANT
SET HNDPC FAN SPRY TIP SCT (DISPOSABLE) IMPLANT
STAPLER VISISTAT 35W (STAPLE) ×3 IMPLANT
STEM STR ATTUNE PF 20X60 (Knees) ×2 IMPLANT
STEM STR ATTUNE PF 22X110 (Knees) ×2 IMPLANT
SUCTION FRAZIER HANDLE 10FR (MISCELLANEOUS)
SUCTION TUBE FRAZIER 10FR DISP (MISCELLANEOUS) IMPLANT
SUT VIC AB 0 CT1 27 (SUTURE)
SUT VIC AB 0 CT1 27XBRD ANBCTR (SUTURE) IMPLANT
SUT VIC AB 1 CTX 36 (SUTURE)
SUT VIC AB 1 CTX36XBRD ANBCTR (SUTURE) IMPLANT
SUT VIC AB 2-0 CTB1 (SUTURE) IMPLANT
SWAB CULTURE ESWAB REG 1ML (MISCELLANEOUS) ×3 IMPLANT
TOWEL GREEN STERILE (TOWEL DISPOSABLE) ×3 IMPLANT
TOWEL GREEN STERILE FF (TOWEL DISPOSABLE) ×3 IMPLANT
TRAY FOLEY MTR SLVR 16FR STAT (SET/KITS/TRAYS/PACK) IMPLANT
WATER STERILE IRR 1000ML POUR (IV SOLUTION) ×3 IMPLANT
WRAP KNEE MAXI GEL POST OP (GAUZE/BANDAGES/DRESSINGS) ×3 IMPLANT
YANKAUER SUCT BULB TIP NO VENT (SUCTIONS) ×2 IMPLANT

## 2019-03-22 NOTE — Anesthesia Procedure Notes (Signed)
Spinal  Patient location during procedure: OR Start time: 03/22/2019 8:35 AM End time: 03/22/2019 8:40 AM Staffing Performed: anesthesiologist  Anesthesiologist: Roberts Gaudy, MD Preanesthetic Checklist Completed: patient identified, IV checked, site marked, risks and benefits discussed, surgical consent, monitors and equipment checked, pre-op evaluation and timeout performed Spinal Block Patient position: sitting Prep: DuraPrep Patient monitoring: heart rate, cardiac monitor, continuous pulse ox and blood pressure Approach: midline Location: L3-4 Injection technique: single-shot Needle Needle type: Pencan  Needle gauge: 24 G Needle length: 9 cm Additional Notes 1.6 mg 0.75% Bupivacaine injected easily

## 2019-03-22 NOTE — H&P (Signed)
[name] is an 54 y.o. male.   Chief Complaint: Right Knee pain HPI: Patient is a 54 year old gentleman who presents 3 months status post removal of infected total knee components placement of an antibiotic spacer IV antibiotics which he has completed patient currently ambulates with a walker is wearing a regular sneaker.  Patient also complains of a new problem with ulcers on both feet.  Patient complains of a worsening ulcer on the left foot and a healing ulcer on the right transmetatarsal amputation  Past Medical History:  Diagnosis Date  . Anemia    as a child  . Anxiety   . Arthritis    "everywhere" (04/21/2012)  . Cellulitis and abscess of foot 12/19/2013   LEFT FOOT  . Cellulitis of right foot   . Chronic pain   . Constipation   . DDD (degenerative disc disease)   . Depression    pt denies  . Diabetes mellitus without complication (HCC)    borderline  . Diabetic foot ulcer ([place])   . ETOH abuse   . Full dentures   . GERD (gastroesophageal reflux disease)    tums  . H/O amputation of lesser toe, right ([place]) 07/29/2017  . History of blood transfusion    "related to left knee OR; probably right hip too" (04/21/2012)  . History of kidney stones   . Mental disorder   . MRSA (methicillin resistant staph aureus) culture positive   . Neuromuscular disorder (HCC)    neuropathy  . Noncompliance   . Open wound    bottom of foot  . Osteomyelitis ([place])    right 4th toe  . Osteomyelitis of right foot ([place])   . Pneumonia ~ 2012  . Polysubstance abuse (HCC)    etoh, cocaine, heroin  . Stroke [facility]) 2008   "they said I might have had one during right hip replacement" (04/21/2012)  . Wears glasses     Past Surgical History:  Procedure Laterality Date  . AMPUTATION Right 06/09/2017   Procedure: RIGHT 2ND TOE AMPUTATION;  Surgeon: [name], MD;  Location: [place];  Service: Orthopedics;  Laterality: Right;  . AMPUTATION Right 07/23/2017   Procedure: RIGHT 4TH TOE AMPUTATION;   Surgeon: [name], MD;  Location: [place];  Service: Orthopedics;  Laterality: Right;  . AMPUTATION Right 06/15/2018   Procedure: RIGHT FOOT TRANSMETATARSAL AMPUTATION;  Surgeon: [name], MD;  Location: [place];  Service: Orthopedics;  Laterality: Right;  right  . EXCISIONAL TOTAL KNEE ARTHROPLASTY WITH ANTIBIOTIC SPACERS Right 11/23/2018   Procedure: REMOVAL OF RIGHT TOTAL KNEE ARTHROPLASTY AND PLACEMENT OF ANATOMIC ANTIBIOTIC SPACER;  Surgeon: [name], MD;  Location: [place];  Service: Orthopedics;  Laterality: Right;  . I & D EXTREMITY Right 11/18/2018   Procedure: RIGHT KNEE DEBRIDEMENT;  Surgeon: [name], MD;  Location: [place];  Service: Orthopedics;  Laterality: Right;  . JOINT REPLACEMENT    . KNEE ARTHROSCOPY Bilateral 1980's/1990's  . LUNG LOBECTOMY Left ~ 2006  . LUNG LOBECTOMY    . METATARSAL OSTEOTOMY  10/29/2011   Procedure: METATARSAL OSTEOTOMY;  Surgeon: [name], MD;  Location: [place];  Service: Orthopedics;  Laterality: Left;  Left 1st Metatarsal Dorsal Closing Wedge   . METATARSAL OSTEOTOMY Right 04/28/2017   Procedure: RIGHT 1ST METATARSAL DORSAL CLOSING WEDGE OSTEOTOMY;  Surgeon: [name], MD;  Location: [place];  Service: Orthopedics;  Laterality: Right;  . MULTIPLE TOOTH EXTRACTIONS    . REVISION TOTAL  HIP ARTHROPLASTY Right 2008   "4-5 months after replacement" (04/21/2012)  . TOTAL HIP ARTHROPLASTY Right 2008  . TOTAL KNEE ARTHROPLASTY Left 2006  . TOTAL KNEE ARTHROPLASTY Right 04/20/2012  . TOTAL KNEE ARTHROPLASTY Right 04/20/2012   Procedure: TOTAL KNEE ARTHROPLASTY;  Surgeon: [name], MD;  Location: [place];  Service: Orthopedics;  Laterality: Right;  Right Total Knee Arthroplasty    Family History  Problem Relation Age of Onset  . Diabetes Father    Social History:  reports that he has been smoking cigarettes. He has a 15.00 pack-year smoking history. He has never used smokeless tobacco. He reports previous alcohol use. He reports  previous drug use. Drug: Cocaine.  Allergies:  Allergies  Allergen Reactions  . Benadryl [Diphenhydramine Hcl] Other (See Comments)    Leg spasms   . Trazodone And Nefazodone Other (See Comments)    Leg spasms     Medications Prior to Admission  Medication Sig Dispense Refill  . metFORMIN (GLUCOPHAGE) 500 MG tablet Take 2 tablets (1,000 mg total) by mouth 2 (two) times daily with a meal. (Patient taking differently: Take 500 mg by mouth 2 (two) times daily with a meal. ) 60 tablet 0  . nicotine (NICODERM CQ - DOSED IN MG/24 HOURS) 21 mg/24hr patch Place 1 patch (21 mg total) onto the skin daily. 28 patch 0  . silver sulfADIAZINE (SILVADENE) 1 % cream Apply 1 application topically daily. 400 g 3  . amLODipine (NORVASC) 5 MG tablet Take 1 tablet (5 mg total) by mouth daily. (Patient not taking: Reported on 03/14/2019) 30 tablet 0  . carvedilol (COREG) 25 MG tablet Take 1 tablet (25 mg total) by mouth 2 (two) times daily with a meal. (Patient not taking: Reported on 03/14/2019) 60 tablet 0  . ibuprofen (ADVIL) 200 MG tablet Take 200 mg by mouth every 6 (six) hours as needed.    . nitroGLYCERIN (NITRODUR - DOSED IN MG/24 HR) 0.2 mg/hr patch Place 1 patch (0.2 mg total) onto the skin daily. (Patient not taking: Reported on 03/14/2019) 30 patch 12  . Oxycodone HCl 10 MG TABS Take 10 mg by mouth in the morning, at noon, in the evening, and at bedtime.    Marland Kitchen oxyCODONE-acetaminophen (PERCOCET/ROXICET) 5-325 MG tablet Take 1 tablet by mouth 2 (two) times daily as needed for severe pain. (Patient not taking: Reported on 03/14/2019) 20 tablet 0  . pentoxifylline (TRENTAL) 400 MG CR tablet Take 1 tablet (400 mg total) by mouth 3 (three) times daily with meals. (Patient not taking: Reported on 03/14/2019) 90 tablet 1  . polyethylene glycol (MIRALAX / GLYCOLAX) 17 g packet Take 17 g by mouth daily as needed for mild constipation. (Patient not taking: Reported on 03/14/2019) 14 each 0  . rosuvastatin (CRESTOR) 20  MG tablet Take 1 tablet (20 mg total) by mouth daily. (Patient not taking: Reported on 03/14/2019) 30 tablet 0  . senna-docusate (SENOKOT-S) 8.6-50 MG tablet Take 2 tablets by mouth at bedtime. (Patient not taking: Reported on 03/14/2019) 14 tablet 0  . vancomycin IVPB Inject 1,000 mg into the vein every 12 (twelve) hours. Indication:  Prosthetic joint infection Last Day of Therapy:  01/03/19 Labs - Sunday/Monday:  CBC/D, BMP, and vancomycin trough. Labs - Thursday:  BMP and vancomycin trough Labs - Every other week:  ESR and CRP (Patient not taking: Reported on 03/14/2019) 2000 Units 34    Results for orders placed or performed during the hospital encounter of 03/20/19 (from the past  48 hour(s))  SARS CORONAVIRUS 2 (TAT 6-24 HRS) Nasopharyngeal Nasopharyngeal Swab     Status: None   Collection Time: 03/20/19  1:09 PM   Specimen: Nasopharyngeal Swab  Result Value Ref Range   SARS Coronavirus 2 NEGATIVE NEGATIVE    Comment: (NOTE) SARS-CoV-2 target nucleic acids are NOT DETECTED. The SARS-CoV-2 RNA is generally detectable in upper and lower respiratory specimens during the acute phase of infection. Negative results do not preclude SARS-CoV-2 infection, do not rule out co-infections with other pathogens, and should not be used as the sole basis for treatment or other patient management decisions. Negative results must be combined with clinical observations, patient history, and epidemiological information. The expected result is Negative. Fact Sheet for Patients: SugarRoll.be Fact Sheet for Healthcare Providers: https://www.woods-mathews.com/ This test is not yet approved or cleared by the [place] FDA and  has been authorized for detection and/or diagnosis of SARS-CoV-2 by FDA under an Emergency Use Authorization (EUA). This EUA will remain  in effect (meaning this test can be used) for the duration of the COVID-19 declaration under Section 56  4(b)(1) of the Act, 21 U.S.C. section 360bbb-3(b)(1), unless the authorization is terminated or revoked sooner. Performed at [facility], [place] [address], [place], [place] [address]    No results found.  Review of Systems  All other systems reviewed and are negative.   There were no vitals taken for this visit. Physical Exam  Patient is alert, oriented, no adenopathy, well-dressed, normal affect, normal respiratory effort. Examination his knee is straight there is no redness no cellulitis there is a minimal effusion no signs of infection radiographs show stable alignment.  Patient has a new blister on the right transmetatarsal amputation this is 10 mm in diameter is healthy superficial epithelialization with no cellulitis no odor no drainage.  Examination of the left foot patient has a cavovarus foot with a Wagner grade 1 ulcer beneath the first metatarsal head which is larger.  After informed consent a 10 blade knife was used to debride the skin and soft tissue back to healthy viable granulation tissue.  This left a wound that was 3 cm in diameter 1 mm deep with healthy granulation tissue no exposed bone or tendon no signs of infection. Lungs Clear Heart Regular Rate and rthym Assessment/Plan 1. Infection of total right knee replacement, subsequent encounter   2. Ulcer of left foot, limited to breakdown of skin ([place])     Plan: Ulcer was debrided of skin and soft tissue on the left foot there is healthy granulation tissue the ulcer on the right foot has superficial epithelialization and the right total knee spacer is intact and functional.  Will plan for surgical intervention for the right knee for a revision total knee arthroplasty risk and benefits were discussed including recurrent infection nonhealing of the wound need for additional surgery.  Patient states he understands wished to proceed at this time he will continue with wound care for both feet.   Bevely Palmer Joaquina Nissen,  PA 03/22/2019, 6:37 AM

## 2019-03-22 NOTE — Op Note (Signed)
03/22/2019  10:43 AM  PATIENT:  [name]    PRE-OPERATIVE DIAGNOSIS: Anatomic antibiotic total knee  POST-OPERATIVE DIAGNOSIS:  Same  PROCEDURE:  RIGHT TOTAL KNEE ARTHROPLASTY REVISION OF ALL COMPONENTS Removal of the anatomic total knee antibiotic implants. Cement with antibiotic cement. Arthroform wound VAC.  SURGEON:  [name], MD  PHYSICIAN ASSISTANT:None ANESTHESIA:   General  PREOPERATIVE INDICATIONS:  [name] is a  54 y.o. male with a diagnosis of Antibiotic Spacer Right Knee who failed conservative measures and elected for surgical management.    The risks benefits and alternatives were discussed with the patient preoperatively including but not limited to the risks of infection, bleeding, nerve injury, cardiopulmonary complications, the need for revision surgery, among others, and the patient was willing to proceed.  OPERATIVE IMPLANTS: DePuy attune revision total knee with size 8 right revision femur 8 mm posterior augments medially and laterally 4 mm distal augments medially and laterally a press-fit stem 22 x 110 mm.  Revision tibia with size 6 revision tibia 10 mm tibial augments medially and laterally with a 20 x 60 mm press-fit stem.  Revision rotating platform size 818 mm.      @ENCIMAGES @  OPERATIVE FINDINGS: No signs of infection there was a clear effusion  OPERATIVE PROCEDURE: Patient was brought the operating room after undergoing a regional block then underwent a spinal anesthetic.  After adequate levels anesthesia were obtained patient's right lower extremity was prepped using DuraPrep draped into a sterile field Ioban was used to cover all exposed skin.  The medial incision was made this carried down to the medial parapatellar retinacular incision.  Using osteotomes the anatomic antibiotic total knee was removed without complications.  First reaming down the canals was first performed the femur was reamed to a size 22 mm in diameter up to the the  sixth line and the tibia was reamed to a size 20 mm up to the fourth morning.  The tibial guide was left in place the tibia was freshened broached for the size 6 revision tibia.  This was left in place and this had a good fit.  10 mm augments were placed medially and laterally.  The femoral size 22 mm reamer was left in place the distal femur resection was freshened and the anterior and posterior chamfers were freshened.  The femur implant was then placed with 8 mm posterior augments 4 mm distal augments and this had good alignment.  Trial tibial trays were tried in the 18 mm had full extension and was stable with varus and valgus.  The trial components were removed the knee was irrigated with normal saline it was soaked with 2 g of TXA.  The prosthetic augment services were then covered with the cement with antibiotics.  There is no cement on the stems.  Both stents were placed the tibial tray was placed and the knee was left in extension until the cement hardened.  The knee had good range of motion from 0 to 90 degrees.  The retinaculum was closed using #1 Vicryl subcu was closed using 0 Vicryl the Praveena sponge was applied this had a good suction fit this was overwrapped with Covan patient was taken to PACU in stable condition.   DISCHARGE PLANNING:  Antibiotic duration: 24 hours IV antibiotics  Weightbearing: Weightbearing as tolerated  Pain medication: Opioid pathway  Dressing care/ Wound VAC: Continue wound VAC for 1 week with patient's diabetes and multiple surgical interventions he is a high risk for wound dehiscence.  Ambulatory devices: Walker or crutches  Discharge to: Anticipate discharge to home  Follow-up: In the office 1 week post operative.

## 2019-03-22 NOTE — Anesthesia Procedure Notes (Signed)
Procedure Name: MAC Date/Time: 03/22/2019 8:40 AM Performed by: Barrington Ellison, CRNA Pre-anesthesia Checklist: Patient identified, Emergency Drugs available, Suction available, Patient being monitored and Timeout performed Patient Re-evaluated:Patient Re-evaluated prior to induction Oxygen Delivery Method: Simple face mask

## 2019-03-22 NOTE — Transfer of Care (Signed)
Immediate Anesthesia Transfer of Care Note  Patient: [name]  Procedure(s) Performed: RIGHT TOTAL KNEE ARTHROPLASTY REVISION OF ALL COMPONENTS (Right Knee)  Patient Location: PACU  Anesthesia Type:MAC and Spinal  Level of Consciousness: awake, alert  and oriented  Airway & Oxygen Therapy: Patient Spontanous Breathing  Post-op Assessment: Report given to RN  Post vital signs: Reviewed and stable  Last Vitals:  Vitals Value Taken Time  BP 154/127 03/22/19 1047  Temp    Pulse 89 03/22/19 1047  Resp 12 03/22/19 1047  SpO2 100 % 03/22/19 1047  Vitals shown include unvalidated device data.  Last Pain:  Vitals:   03/22/19 0702  TempSrc:   PainSc: 8       Patients Stated Pain Goal: 3 (34/03/70 9643)  Complications: No apparent anesthesia complications

## 2019-03-22 NOTE — Anesthesia Postprocedure Evaluation (Signed)
Anesthesia Post Note  Patient: [name]  Procedure(s) Performed: RIGHT TOTAL KNEE ARTHROPLASTY REVISION OF ALL COMPONENTS (Right Knee)     Patient location during evaluation: PACU Anesthesia Type: MAC, Spinal and Regional Level of consciousness: oriented and awake and alert Pain management: pain level controlled Vital Signs Assessment: post-procedure vital signs reviewed and stable Respiratory status: spontaneous breathing, respiratory function stable and patient connected to nasal cannula oxygen Cardiovascular status: blood pressure returned to baseline and stable Postop Assessment: no headache, no backache, no apparent nausea or vomiting, spinal receding and patient able to bend at knees Anesthetic complications: no    Last Vitals:  Vitals:   03/22/19 1135 03/22/19 1230  BP: (!) 89/66   Pulse: 78 100  Resp: 13 (!) 24  Temp:    SpO2: 100% 100%    Last Pain:  Vitals:   03/22/19 1130  TempSrc:   PainSc: 9                  Trease Bremner,W. EDMOND

## 2019-03-22 NOTE — Evaluation (Signed)
Physical Therapy Evaluation Patient Details Name: [name] MRN: [id number] DOB: [birth year] Today's Date: 03/22/2019   History of Present Illness  Pt is a 54 y/o male s/p R total knee revision. PMH includes DM, chronic pain, CVA, polysubstance abuse, s/p R transmet amputation, s/p R THA, s/p R TKA, and s/p L TKA.   Clinical Impression  Pt is s/p surgery above with deficits below. Pt with 10/10 pain, so mobility limited to bed mobility. Educated pt to perform ankle pumps (X20) every hour, and educated about knee precautions. Will continue to follow acutely to maximize functional mobility independence and safety.     Follow Up Recommendations Follow surgeon's recommendation for DC plan and follow-up therapies    Equipment Recommendations  None recommended by PT    Recommendations for Other Services       Precautions / Restrictions Precautions Precautions: Knee Precaution Booklet Issued: No Precaution Comments: Reviewed knee precautions with pt.  Restrictions Weight Bearing Restrictions: Yes RLE Weight Bearing: Weight bearing as tolerated      Mobility  Bed Mobility Overal bed mobility: Needs Assistance Bed Mobility: Supine to Sit;Sit to Supine     Supine to sit: Min assist Sit to supine: Min assist   General bed mobility comments: Min A for RLE assist. Pt with 10/10 pain upon sitting and requesting to defer further mobility.   Transfers                    Ambulation/Gait                Stairs            Wheelchair Mobility    Modified Rankin (Stroke Patients Only)       Balance Overall balance assessment: Needs assistance Sitting-balance support: No upper extremity supported;Feet supported Sitting balance-Leahy Scale: Fair     Standing balance support: Bilateral upper extremity supported;During functional activity Standing balance-Leahy Scale: Poor Standing balance comment: Reliant on BUE support                               Pertinent Vitals/Pain Pain Assessment: 0-10 Pain Score: 10-Worst pain ever Pain Location: R knee Pain Descriptors / Indicators: Aching;Operative site guarding;Grimacing Pain Intervention(s): Monitored during session;Limited activity within patient's tolerance;Repositioned    Home Living Family/patient expects to be discharged to:: Private residence Living Arrangements: Children Available Help at Discharge: Family;Available 24 hours/day Type of Home: House Home Access: Stairs to enter Entrance Stairs-Rails: Right;Left;Can reach both Entrance Stairs-Number of Steps: 5 Home Layout: One level Home Equipment: Walker - 2 wheels;Cane - single point;Crutches;Shower seat;Bedside commode      Prior Function Level of Independence: Independent with assistive device(s)         Comments: Had been using RW since previous surgery      Hand Dominance        Extremity/Trunk Assessment   Upper Extremity Assessment Upper Extremity Assessment: Defer to OT evaluation    Lower Extremity Assessment Lower Extremity Assessment: LLE deficits/detail LLE Deficits / Details: Deficits consistent with post op pain and weakness.     Cervical / Trunk Assessment Cervical / Trunk Assessment: Normal  Communication   Communication: No difficulties  Cognition Arousal/Alertness: Awake/alert Behavior During Therapy: WFL for tasks assessed/performed Overall Cognitive Status: Within Functional Limits for tasks assessed  General Comments      Exercises Total Joint Exercises Ankle Circles/Pumps: AROM;Both;Supine;15 reps   Assessment/Plan    PT Assessment Patient needs continued PT services  PT Problem List Decreased strength;Decreased range of motion;Decreased activity tolerance;Decreased balance;Decreased mobility;Decreased knowledge of use of DME;Decreased knowledge of precautions;Pain       PT Treatment Interventions DME  instruction;Gait training;Stair training;Functional mobility training;Therapeutic activities;Therapeutic exercise;Balance training;Patient/family education    PT Goals (Current goals can be found in the Care Plan section)  Acute Rehab PT Goals Patient Stated Goal: to decrease pain  PT Goal Formulation: With patient Time For Goal Achievement: 04/05/19 Potential to Achieve Goals: Good    Frequency 7X/week   Barriers to discharge        Co-evaluation               AM-PAC PT "6 Clicks" Mobility  Outcome Measure Help needed turning from your back to your side while in a flat bed without using bedrails?: A Little Help needed moving from lying on your back to sitting on the side of a flat bed without using bedrails?: A Little Help needed moving to and from a bed to a chair (including a wheelchair)?: A Lot Help needed standing up from a chair using your arms (e.g., wheelchair or bedside chair)?: A Lot Help needed to walk in hospital room?: A Lot Help needed climbing 3-5 steps with a railing? : A Lot 6 Click Score: 14    End of Session   Activity Tolerance: Patient limited by pain Patient left: in bed;with call bell/phone within reach Nurse Communication: Mobility status PT Visit Diagnosis: Difficulty in walking, not elsewhere classified (R26.2);Muscle weakness (generalized) (M62.81);Pain Pain - Right/Left: Right Pain - part of body: Knee    Time: 1654-1710 PT Time Calculation (min) (ACUTE ONLY): 16 min   Charges:   PT Evaluation $PT Eval Low Complexity: 1 Low          Lou Miner, DPT  Acute Rehabilitation Services  Pager: [phone] Office: [phone]   Rudean Hitt 03/22/2019, 6:18 PM

## 2019-03-22 NOTE — Progress Notes (Signed)
Left message for Dr. Sharol Given regarding wound care for bilateral diabetic ulcers on bottom of feet.

## 2019-03-22 NOTE — Anesthesia Procedure Notes (Signed)
Anesthesia Regional Block: Adductor canal block   Pre-Anesthetic Checklist: ,, timeout performed, Correct Patient, Correct Site, Correct Laterality, Correct Procedure, Correct Position, site marked, Risks and benefits discussed, pre-op evaluation,  At surgeon's request and post-op pain management  Laterality: Right  Prep: Maximum Sterile Barrier Precautions used, chloraprep       Needles:  Injection technique: Single-shot  Needle Type: Echogenic Stimulator Needle     Needle Length: 9cm  Needle Gauge: 21     Additional Needles:   Procedures:,,,, ultrasound used (permanent image in chart),,,,  Narrative:  Start time: 03/22/2019 7:40 AM End time: 03/22/2019 7:50 AM Injection made incrementally with aspirations every 5 mL.  Performed by: Personally  Anesthesiologist: Roderic Palau, MD  Additional Notes: 2% Lidocaine skin wheel.

## 2019-03-23 ENCOUNTER — Encounter: Payer: Self-pay | Admitting: *Deleted

## 2019-03-23 LAB — GLUCOSE, CAPILLARY
Glucose-Capillary: 178 mg/dL — ABNORMAL HIGH (ref 70–99)
Glucose-Capillary: 109 mg/dL — ABNORMAL HIGH (ref 70–99)
Glucose-Capillary: 210 mg/dL — ABNORMAL HIGH (ref 70–99)
Glucose-Capillary: 152 mg/dL — ABNORMAL HIGH (ref 70–99)

## 2019-03-23 MED ORDER — NICOTINE 21 MG/24HR TD PT24
21.0000 mg | MEDICATED_PATCH | Freq: Every day | TRANSDERMAL | Status: DC
  Administered 2019-03-23 – 2019-03-25 (×3): 21 mg via TRANSDERMAL
  Filled 2019-03-23 (×3): qty 1

## 2019-03-23 MED ORDER — CHLORHEXIDINE GLUCONATE CLOTH 2 % EX PADS
6.0000 | MEDICATED_PAD | Freq: Every day | CUTANEOUS | Status: DC
  Administered 2019-03-23 – 2019-03-25 (×3): 6 via TOPICAL

## 2019-03-23 MED ORDER — MUPIROCIN 2 % EX OINT
TOPICAL_OINTMENT | Freq: Two times a day (BID) | CUTANEOUS | Status: DC
  Filled 2019-03-23 (×2): qty 22

## 2019-03-23 MED ORDER — ADULT MULTIVITAMIN W/MINERALS CH
1.0000 | ORAL_TABLET | Freq: Every day | ORAL | Status: DC
  Administered 2019-03-23 – 2019-03-25 (×3): 1 via ORAL
  Filled 2019-03-23 (×3): qty 1

## 2019-03-23 MED ORDER — GLUCERNA SHAKE PO LIQD
237.0000 mL | Freq: Three times a day (TID) | ORAL | Status: DC
  Administered 2019-03-23 – 2019-03-24 (×4): 237 mL via ORAL

## 2019-03-23 NOTE — Progress Notes (Signed)
Physical Therapy Treatment Patient Details Name: [name] MRN: [id number] DOB: May 25, 1965 Today's Date: 03/23/2019    History of Present Illness Pt is a 54 y/o male s/p R total knee revision. PMH includes DM, chronic pain, CVA, polysubstance abuse, s/p R transmet amputation, s/p R THA, s/p R TKA, and s/p L TKA.     PT Comments    Pt progressing with acute PT and increased tolerance to ambulate today. He continues to require assistance for Rt LE mobility to perform bed mobility and requires elevated surface to perform sit<>stand transfer with RW. Patient was able to un-weight Rt LE effectively with RW support to prevent knee buckling in stance phase of gait. He required min assist during gait and cues for safe management of RW throughout. Pt declined to sit up in chair at EOS and ankle pumps reviewed for exercises in bed. Acute PT will continue to follow to progress mobility as able.   Follow Up Recommendations  Follow surgeon's recommendation for DC plan and follow-up therapies     Equipment Recommendations  None recommended by PT    Recommendations for Other Services       Precautions / Restrictions Precautions Precautions: Knee Precaution Booklet Issued: No Precaution Comments: Reviewed knee precautions with pt.  Restrictions Weight Bearing Restrictions: No RLE Weight Bearing: Weight bearing as tolerated    Mobility  Bed Mobility Overal bed mobility: Needs Assistance Bed Mobility: Supine to Sit;Sit to Supine     Supine to sit: Min assist Sit to supine: Min assist   General bed mobility comments: Min A for RLE  Transfers Overall transfer level: Needs assistance Equipment used: Rolling walker (2 wheeled) Transfers: Sit to/from Stand Sit to Stand: Min assist;From elevated surface         General transfer comment: cues for safety as pt eager to mobilize, bed elevated and cues required for safe hand placement to initiate power up, light assist needed to  rise.  Ambulation/Gait Ambulation/Gait assistance: Min assist Gait Distance (Feet): 80 Feet Assistive device: Rolling walker (2 wheeled) Gait Pattern/deviations: Decreased weight shift to right;Decreased stance time - right;Step-through pattern Gait velocity: fair   General Gait Details: verbal cues required for safety with step pattern and proximity to RW for gait. min assist required  2x to correct slight LOB. HR resting at 100 at EOB priro to gait and increased to ~124 at end of gait. Pt able to maintain WBAT status and decrease weight in Rt LE to prevent buckling in gait.   Stairs             Wheelchair Mobility    Modified Rankin (Stroke Patients Only)       Balance Overall balance assessment: Needs assistance Sitting-balance support: No upper extremity supported;Feet supported Sitting balance-Leahy Scale: Good     Standing balance support: Bilateral upper extremity supported;During functional activity Standing balance-Leahy Scale: Poor Standing balance comment: Reliant on BUE support          Cognition Arousal/Alertness: Awake/alert Behavior During Therapy: WFL for tasks assessed/performed Overall Cognitive Status: Within Functional Limits for tasks assessed           Exercises Total Joint Exercises Ankle Circles/Pumps: AROM;Both;Supine;15 reps    General Comments        Pertinent Vitals/Pain Pain Assessment: 0-10 Pain Score: 7  Pain Location: R knee Pain Descriptors / Indicators: Aching;Operative site guarding;Grimacing Pain Intervention(s): Limited activity within patient's tolerance;Monitored during session;Repositioned;Premedicated before session    Home Living Family/patient expects to be discharged to::  Private residence Living Arrangements: Children(Son)           Home Equipment: Gilford Rile - 2 wheels;Cane - single point;Crutches;Shower seat;Bedside commode;Shower seat - built in      Prior Function Level of Independence: Independent  with assistive device(s)          PT Goals (current goals can now be found in the care plan section) Acute Rehab PT Goals Patient Stated Goal: to decrease pain  PT Goal Formulation: With patient Time For Goal Achievement: 04/05/19 Potential to Achieve Goals: Good Progress towards PT goals: Progressing toward goals    Frequency    7X/week      PT Plan Current plan remains appropriate       AM-PAC PT "6 Clicks" Mobility   Outcome Measure  Help needed turning from your back to your side while in a flat bed without using bedrails?: A Little Help needed moving from lying on your back to sitting on the side of a flat bed without using bedrails?: A Little Help needed moving to and from a bed to a chair (including a wheelchair)?: A Lot Help needed standing up from a chair using your arms (e.g., wheelchair or bedside chair)?: A Lot Help needed to walk in hospital room?: A Little Help needed climbing 3-5 steps with a railing? : A Lot 6 Click Score: 15    End of Session Equipment Utilized During Treatment: Gait belt Activity Tolerance: Patient limited by pain Patient left: in bed;with call bell/phone within reach Nurse Communication: Mobility status PT Visit Diagnosis: Difficulty in walking, not elsewhere classified (R26.2);Muscle weakness (generalized) (M62.81);Pain Pain - Right/Left: Right Pain - part of body: Knee     Time: [id number] PT Time Calculation (min) (ACUTE ONLY): 25 min  Charges:  $Gait Training: 8-22 mins $Therapeutic Activity: 8-22 mins                    Verner Mould, DPT Physical Therapist with [facility] [phone]  03/23/2019 12:25 PM

## 2019-03-23 NOTE — Evaluation (Signed)
Occupational Therapy Evaluation Patient Details Name: [name] MRN: [id number] DOB: November 12, 1965 Today's Date: 03/23/2019    History of Present Illness Pt is a 54 y/o male s/p R total knee revision. PMH includes DM, chronic pain, CVA, polysubstance abuse, s/p R transmet amputation, s/p R THA, s/p R TKA, and s/p L TKA.    Clinical Impression   Pt was seen for initial evaluation. He has had several knee surgeries in the past.  He was mod independent with adls prior admission, and needs mostly min A at this time. His son is available to assist at home. Pain limits him at this time.  Will follow in acute setting to review bathroom transfers at supervision level    Follow Up Recommendations  Supervision/Assistance - 24 hour    Equipment Recommendations  None recommended by OT    Recommendations for Other Services       Precautions / Restrictions Precautions Precautions: Knee Precaution Comments: Reviewed knee precautions with pt.  Restrictions RLE Weight Bearing: Weight bearing as tolerated      Mobility Bed Mobility         Supine to sit: Min assist Sit to supine: Min assist   General bed mobility comments: Min A for RLE  Transfers Overall transfer level: Needs assistance Equipment used: Rolling walker (2 wheeled) Transfers: Sit to/from Stand Sit to Stand: Min assist         General transfer comment: from very elevated bed; light assistance to stand and stabilize    Balance                                           ADL either performed or assessed with clinical judgement   ADL Overall ADL's : Needs assistance/impaired Eating/Feeding: Independent   Grooming: Supervision/safety   Upper Body Bathing: Set up   Lower Body Bathing: Minimal assistance   Upper Body Dressing : Set up   Lower Body Dressing: Minimal assistance;Moderate assistance   Toilet Transfer: Minimal assistance;Ambulation   Toileting- Clothing Manipulation and Hygiene:  Min guard         General ADL Comments: Pt able to reach to foot, but needs min A for LB adls. Needs slightly more assistance for dressing due to vac line.  Pt requested to walk in room; did not feel he needed to use toilet at this time. Stood from very elevated bed     Vision         Perception     Praxis      Pertinent Vitals/Pain Pain Assessment: 0-10 Pain Score: 8  Pain Location: R knee Pain Descriptors / Indicators: Aching;Operative site guarding;Grimacing Pain Intervention(s): Limited activity within patient's tolerance;Monitored during session;Premedicated before session;Repositioned     Hand Dominance Right   Extremity/Trunk Assessment Upper Extremity Assessment Upper Extremity Assessment: Overall WFL for tasks assessed           Communication Communication Communication: No difficulties   Cognition Arousal/Alertness: Awake/alert Behavior During Therapy: WFL for tasks assessed/performed Overall Cognitive Status: Within Functional Limits for tasks assessed                                     General Comments       Exercises     Shoulder Instructions      Home Living Family/patient expects  to be discharged to:: Private residence Living Arrangements: Children(Son)                 Bathroom Shower/Tub: Walk-in Psychologist, prison and probation services: [place]: Environmental consultant - 2 wheels;Cane - single point;Crutches;Shower seat;Bedside commode;Shower seat - built in          Prior Functioning/Environment Level of Independence: Independent with assistive device(s)                 OT Problem List: Decreased activity tolerance;Pain;Decreased strength      OT Treatment/Interventions: Self-care/ADL training;DME and/or AE instruction;Patient/family education    OT Goals(Current goals can be found in the care plan section) Acute Rehab OT Goals Patient Stated Goal: to decrease pain  OT Goal Formulation: With patient Time For  Goal Achievement: 03/30/19 Potential to Achieve Goals: Good ADL Goals Pt Will Transfer to Toilet: with supervision;ambulating;bedside commode Pt Will Perform Tub/Shower Transfer: with supervision;Shower transfer;ambulating;shower seat  OT Frequency: Min 2X/week   Barriers to D/C:            Co-evaluation              AM-PAC OT "6 Clicks" Daily Activity     Outcome Measure Help from another person eating meals?: None Help from another person taking care of personal grooming?: A Little Help from another person toileting, which includes using toliet, bedpan, or urinal?: A Little Help from another person bathing (including washing, rinsing, drying)?: A Little Help from another person to put on and taking off regular upper body clothing?: A Little Help from another person to put on and taking off regular lower body clothing?: A Lot 6 Click Score: 18   End of Session    Activity Tolerance: Patient tolerated treatment well Patient left: in bed;with call bell/phone within reach  OT Visit Diagnosis: Pain;Muscle weakness (generalized) (M62.81) Pain - Right/Left: Right Pain - part of body: Knee                Time: [id number] OT Time Calculation (min): 25 min Charges:  OT General Charges $OT Visit: 1 Visit OT Evaluation $OT Eval Low Complexity: 1 Low OT Treatments $Therapeutic Activity: 8-22 mins  Cassey Bacigalupo S, OTR/L Acute Rehabilitation Services 03/23/2019  Ahleah Simko 03/23/2019, 11:20 AM

## 2019-03-23 NOTE — Progress Notes (Signed)
Initial Nutrition Assessment  DOCUMENTATION CODES:   Non-severe (moderate) malnutrition in context of chronic illness  INTERVENTION:   -Glucerna Shake po TID, each supplement provides 220 kcal and 10 grams of protein -MVI with minerals daily -Double protein portions with meals  NUTRITION DIAGNOSIS:   Moderate Malnutrition related to chronic illness(DM) as evidenced by mild fat depletion, moderate fat depletion, mild muscle depletion, moderate muscle depletion.  GOAL:   Patient will meet greater than or equal to 90% of their needs  MONITOR:   PO intake, Supplement acceptance, Labs, Weight trends, Skin, I & O's  REASON FOR ASSESSMENT:   Malnutrition Screening Tool    ASSESSMENT:   Patient is a 54 year old gentleman who presents 3 months status post removal of infected total knee components placement of an antibiotic spacer IV antibiotics which he has completed patient currently ambulates with a walker is wearing a regular sneaker.  Patient also complains of a new problem with ulcers on both feet.  Patient complains of a worsening ulcer on the left foot and a healing ulcer on the right transmetatarsal amputation  Pt admitted with infection of rt total knee replacement.   3/3- s/p PROCEDURE:  RIGHT TOTAL KNEE ARTHROPLASTY REVISION OF ALL COMPONENTS Removal of the anatomic total knee antibiotic implants. Cement with antibiotic cement. Arthroform wound VAC.  Reviewed I/O's: -2.4 L x 24 hours  UOP: 4.4 L x 24 hours  Per orthopedics notes, pt also with ulcer to lt foot, which was debrided at bedside on admission.   Case discussed with OT, who reports pt did well during walk evaluation today.   Spoke with pt at bedside, who was pleasant and good spirits today. He reports that he is concerned that he is not getting enough to eat since being admitted to the hospital. Pt shares that he underwent knee replacement last November and lost 30# in the two week hospitalization and unable  to regain back lost weight. Pt shares he has a very good appetite, but PTA has been relying on frozen dinners and convenience foods due to limited mobility. Pt shares that he eats 4-5 times per day, usually [place] [employer] biscuit in the morning and a variety of frozen dinner during the day.   Reviewed wt hx; pt has experienced a 6.7% wt loss over the past 6 months, which is not significant for time frame. Per pt UBW is 180#, which was had been able to maintain prior to knee surgeries by optimizing diabetes self-management.   Medications reviewed and include colace  Lab Results  Component Value Date   HGBA1C 6.4 (H) 03/20/2019   PTA DM medications are 500 mg metformin BID. Spoke with pt regarding DM control at home; he reports his CBGS usually range in the 140's and has been maintaining Hgb A1c around 6. He confirms taking only metformin at home, but reports concern of hyperglycemia in the hospital. RD discussed stress response related hyperglycemia and use of insulin and frequent CBGS checks in the hospital to optimize glycemic control to promote post-operative healing. Pt concerned about carb modified diet order; discussed importance of good glycemic control, especially if eating well. Pt amenable to Glucerna supplements and double protein portions on meal trays to help meet increased nutritional needs.   Labs reviewed: CBGS: 122-175 (inpatient orders for glycemic control are 0-9 units insulin aspart TID with meals and 500 mg metformin BID ).   NUTRITION - FOCUSED PHYSICAL EXAM:    Most Recent Value  Orbital Region  Mild depletion  Upper Arm Region  Moderate depletion  Thoracic and Lumbar Region  Mild depletion  Buccal Region  Mild depletion  Temple Region  Moderate depletion  Clavicle Bone Region  Mild depletion  Clavicle and Acromion Bone Region  Mild depletion  Scapular Bone Region  Mild depletion  Dorsal Hand  Moderate depletion  Patellar Region  Moderate depletion  Anterior Thigh  Region  Moderate depletion  Posterior Calf Region  Moderate depletion  Edema (RD Assessment)  None  Hair  Reviewed  Eyes  Reviewed  Mouth  Reviewed  Skin  Reviewed  Nails  Reviewed       Diet Order:   Diet Order            Diet Carb Modified Fluid consistency: Thin; Room service appropriate? Yes  Diet effective now              EDUCATION NEEDS:   Education needs have been addressed  Skin:  Skin Assessment: Skin Integrity Issues: Skin Integrity Issues:: Wound VAC, Diabetic Ulcer Wound Vac: closed rt knee incision Diabetic Ulcer: lt foot  Last BM:  03/22/19  Height:   Ht Readings from Last 1 Encounters:  03/22/19 6\' 3"  (1.905 m)    Weight:   Wt Readings from Last 1 Encounters:  03/22/19 78.3 kg    Ideal Body Weight:  89.1 kg  BMI:  Body mass index is 21.58 kg/m.  Estimated Nutritional Needs:   Kcal:  [id number]  Protein:  140-155 grams  Fluid:  > 2.3 L    Loistine Chance, RD, LDN, [place] Registered Dietitian II Certified Diabetes Care and Education Specialist Please refer to [facility] for RD and/or RD on-call/weekend/after hours pager

## 2019-03-23 NOTE — Progress Notes (Signed)
Physical Therapy Treatment Patient Details Name: [name] MRN: [id number] DOB: 1965-06-15 Today's Date: 03/23/2019    History of Present Illness Pt is a 54 y/o male s/p R total knee revision. PMH includes DM, chronic pain, CVA, polysubstance abuse, s/p R transmet amputation, s/p R THA, s/p R TKA, and s/p L TKA.     PT Comments    Patient making steady progress with PT. Patient able to tolerate increased distance for walking this session and continues to require cues for safe proximity to walker. Patient's Rt quad remains weak and pt with 1 episode of buckling slight during gait requiring min assist to steady. Pt instructed on exercises for ROM to address knee flexion and quad strength. Acute PT will continue to follow and progress as able in preparation for safe discharge.   Follow Up Recommendations  Follow surgeon's recommendation for DC plan and follow-up therapies     Equipment Recommendations  None recommended by PT    Recommendations for Other Services       Precautions / Restrictions Precautions Precautions: Knee Precaution Booklet Issued: No Precaution Comments: Reviewed knee precautions with pt.  Restrictions Weight Bearing Restrictions: No RLE Weight Bearing: Weight bearing as tolerated    Mobility  Bed Mobility Overal bed mobility: Needs Assistance Bed Mobility: Supine to Sit;Sit to Supine     Supine to sit: Min guard Sit to supine: Min guard   General bed mobility comments: cues for use of gait belt to assist with Rt LE mobility to raise leg on/off of EOB. no assist required but pt requires extra time.  Transfers Overall transfer level: Needs assistance Equipment used: Rolling walker (2 wheeled) Transfers: Sit to/from Stand Sit to Stand: Min assist;From elevated surface         General transfer comment: pt continues to require cues for safety regarding set up of lines with wound vac and IV. Pt required min assist from elevated EOB and Min assist from  recliner and use of bil UE for power up. pt continues to require cues for safe hand placement to initiate power up.  Ambulation/Gait Ambulation/Gait assistance: Min assist Gait Distance (Feet): 190 Feet(180, 10) Assistive device: Rolling walker (2 wheeled) Gait Pattern/deviations: Decreased weight shift to right;Decreased stance time - right;Step-through pattern Gait velocity: fair   General Gait Details: pt conitnues to require verbal cues for safe step pattern and proximity to RW during gait. He was able to prevent Rt LE buckling and noted to have 1 episode of slight buckling requiring min assist for safety. Pt required 1 standing rest break during gait and reported fatigue of Rt quad at end of ambulation.   Stairs      Wheelchair Mobility    Modified Rankin (Stroke Patients Only)       Balance Overall balance assessment: Needs assistance Sitting-balance support: No upper extremity supported;Feet supported Sitting balance-Leahy Scale: Good     Standing balance support: Bilateral upper extremity supported;During functional activity   Standing balance comment: Reliant on BUE support            Cognition Arousal/Alertness: Awake/alert Behavior During Therapy: WFL for tasks assessed/performed Overall Cognitive Status: Within Functional Limits for tasks assessed           Exercises Total Joint Exercises Ankle Circles/Pumps: AROM;Both;Supine;15 reps Quad Sets: AROM;10 reps;Supine;Right(towel under knee for tactile cue, pt quad weak and unable to perform SAQ) Heel Slides: AAROM;10 reps;Supine;Right    General Comments        Pertinent Vitals/Pain Pain Assessment:  0-10 Pain Score: 7  Pain Location: R knee Pain Descriptors / Indicators: Aching;Sore Pain Intervention(s): Limited activity within patient's tolerance;Monitored during session;Repositioned           PT Goals (current goals can now be found in the care plan section) Acute Rehab PT Goals Patient  Stated Goal: to decrease pain  PT Goal Formulation: With patient Time For Goal Achievement: 04/05/19 Potential to Achieve Goals: Good Progress towards PT goals: Progressing toward goals    Frequency    7X/week      PT Plan Current plan remains appropriate       AM-PAC PT "6 Clicks" Mobility   Outcome Measure  Help needed turning from your back to your side while in a flat bed without using bedrails?: A Little Help needed moving from lying on your back to sitting on the side of a flat bed without using bedrails?: A Little Help needed moving to and from a bed to a chair (including a wheelchair)?: A Little Help needed standing up from a chair using your arms (e.g., wheelchair or bedside chair)?: A Little Help needed to walk in hospital room?: A Little Help needed climbing 3-5 steps with a railing? : A Lot 6 Click Score: 17    End of Session Equipment Utilized During Treatment: Gait belt Activity Tolerance: Patient limited by pain Patient left: in bed;with call bell/phone within reach Nurse Communication: Mobility status PT Visit Diagnosis: Difficulty in walking, not elsewhere classified (R26.2);Muscle weakness (generalized) (M62.81);Pain Pain - Right/Left: Right Pain - part of body: Knee     Time: [id number] PT Time Calculation (min) (ACUTE ONLY): 35 min  Charges:  $Gait Training: 8-22 mins $Therapeutic Exercise: 8-22 mins                    Verner Mould, DPT Physical Therapist with [facility] [phone]  03/23/2019 5:15 PM

## 2019-03-23 NOTE — Progress Notes (Signed)
POD1  Right Knee replacement Revision. Awake states he does have some pain but tolerable.  VSS afebrile. Dressing Clean dry and intact. VAC functioning well with 0 cc in canister  Stable. Will work with PT. Plan for home with home health next 1-2 days

## 2019-03-23 NOTE — Plan of Care (Signed)

## 2019-03-24 LAB — GLUCOSE, CAPILLARY
Glucose-Capillary: 154 mg/dL — ABNORMAL HIGH (ref 70–99)
Glucose-Capillary: 204 mg/dL — ABNORMAL HIGH (ref 70–99)
Glucose-Capillary: 181 mg/dL — ABNORMAL HIGH (ref 70–99)
Glucose-Capillary: 102 mg/dL — ABNORMAL HIGH (ref 70–99)

## 2019-03-24 IMAGING — DX DG SHOULDER 2+V*L*
3 series · 3 of 3 positions shown · non-contrast
Comparison: None.

CLINICAL DATA: Left shoulder pain after fall.

EXAM:
LEFT SHOULDER - 2+ VIEW

[shoulder grashey]
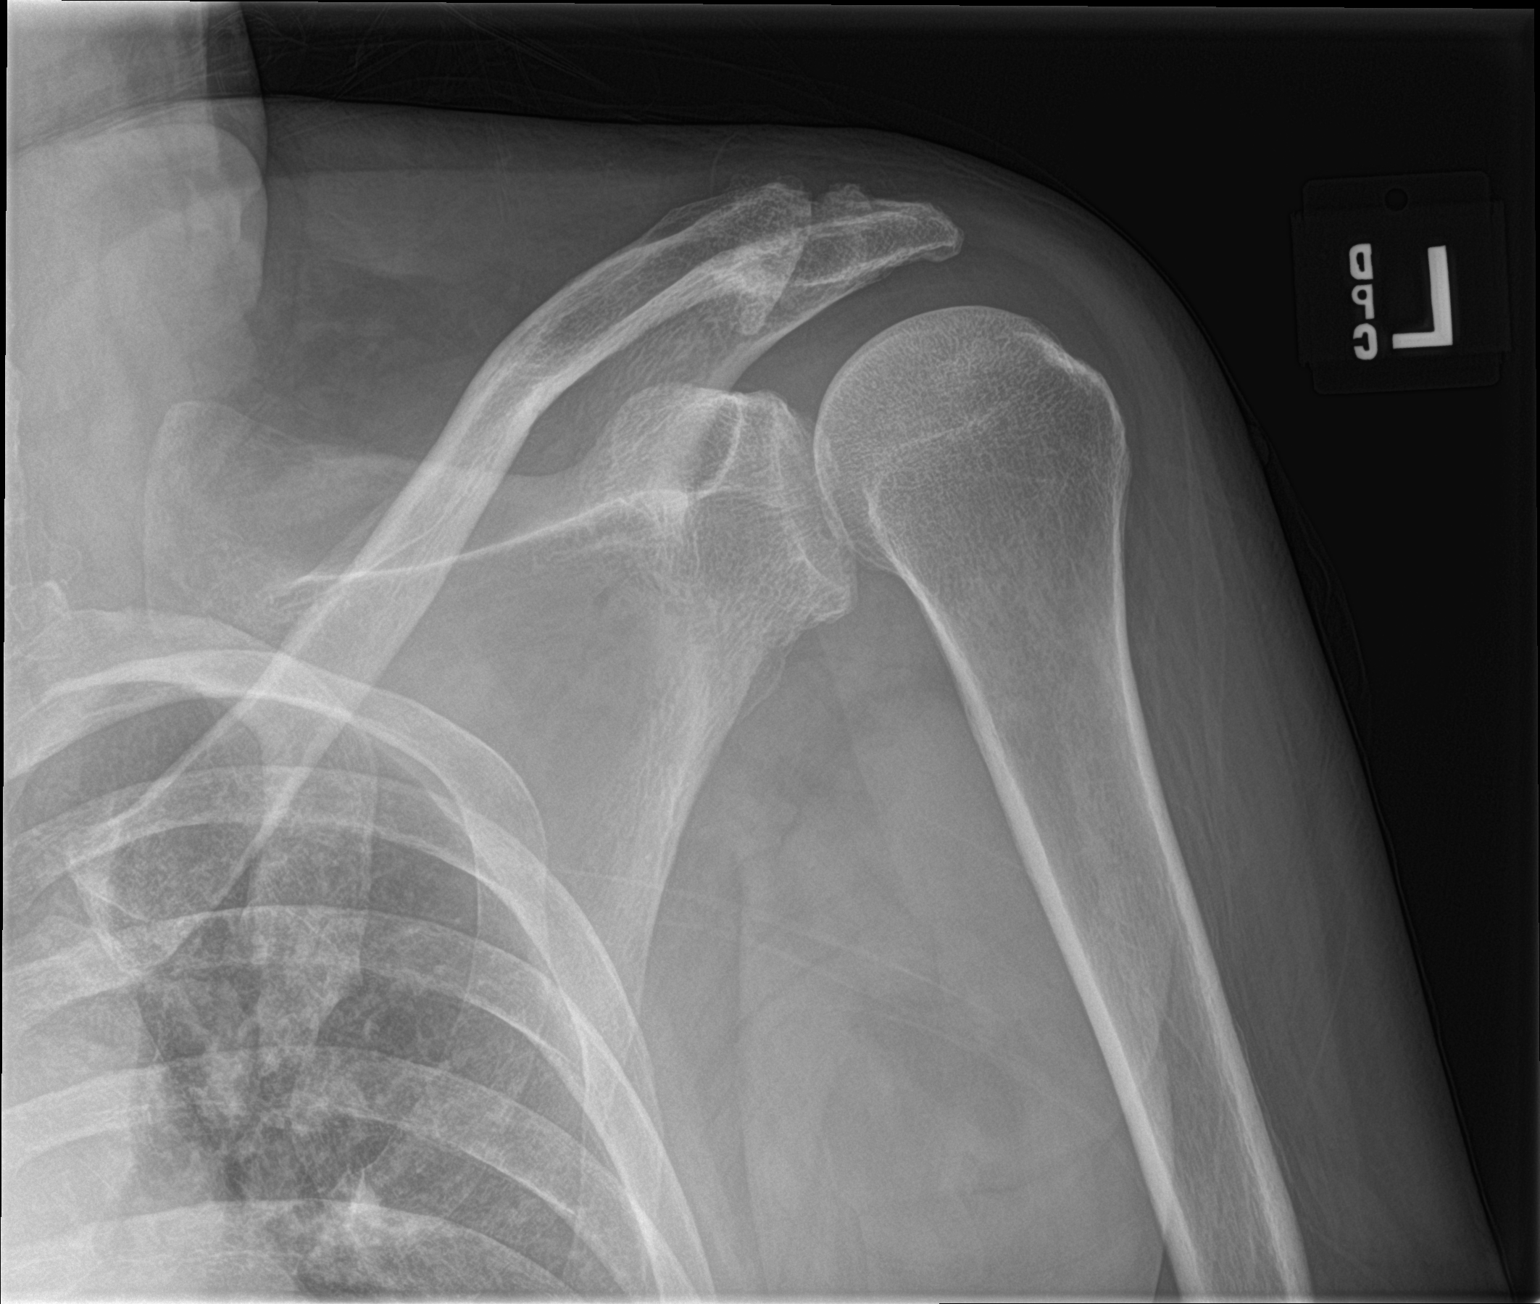

[shoulder y view]
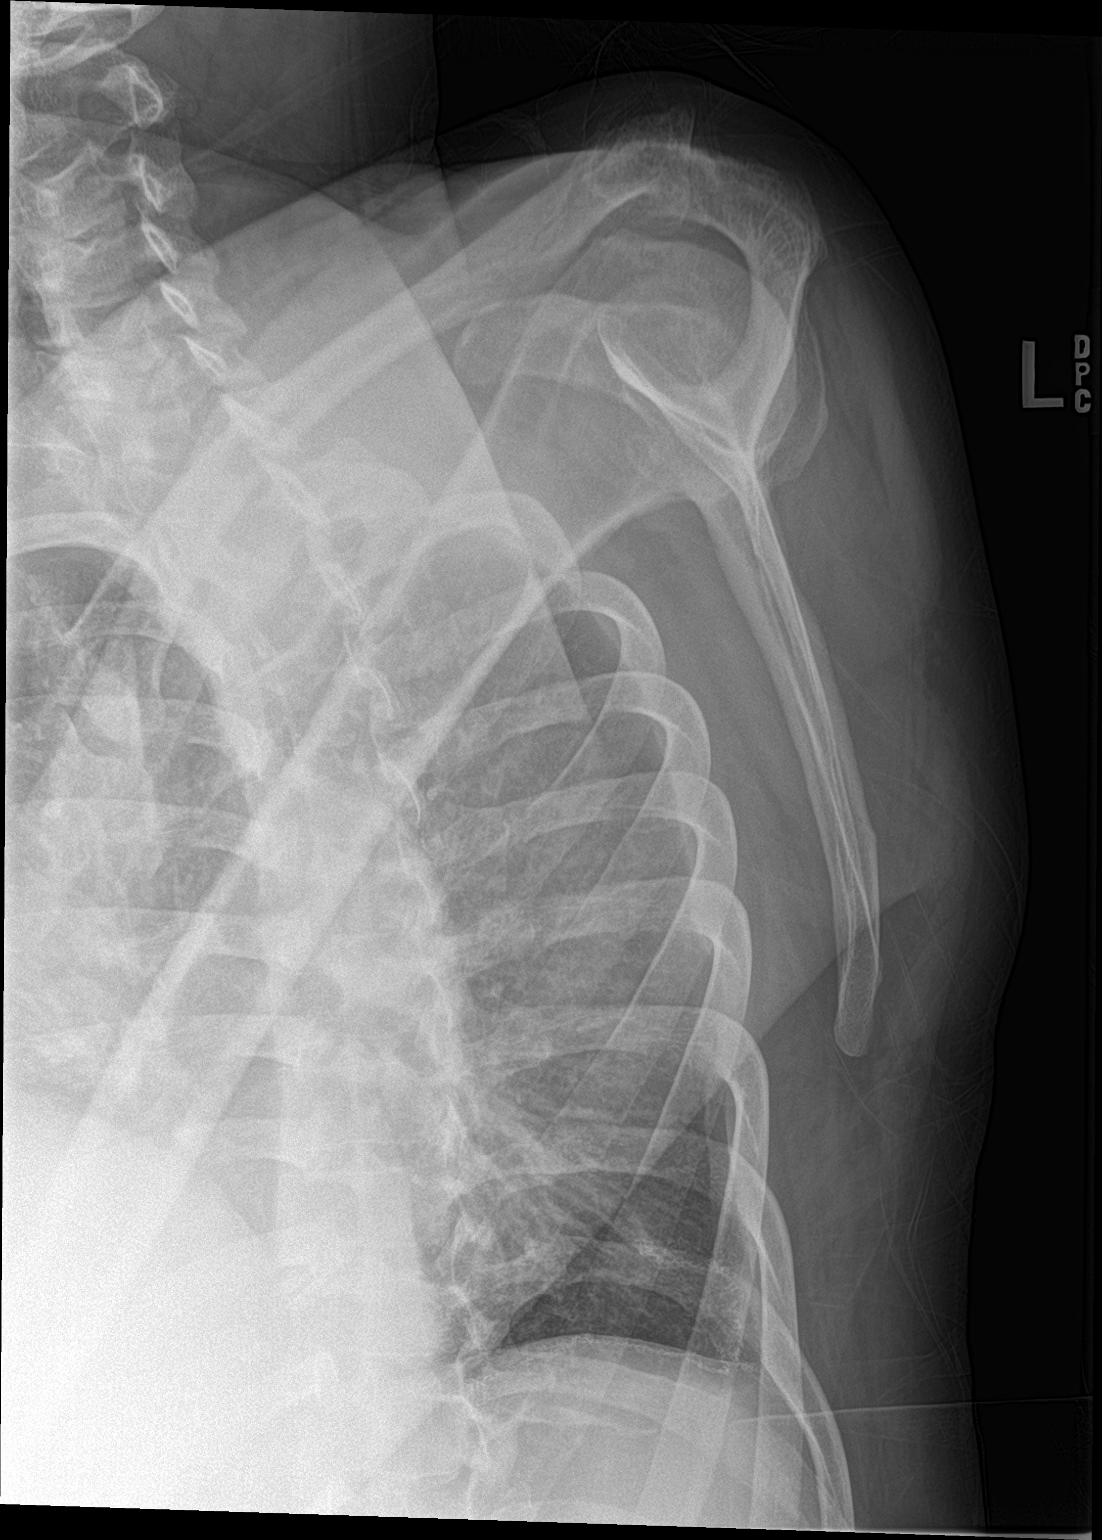

[shoulder axillary]
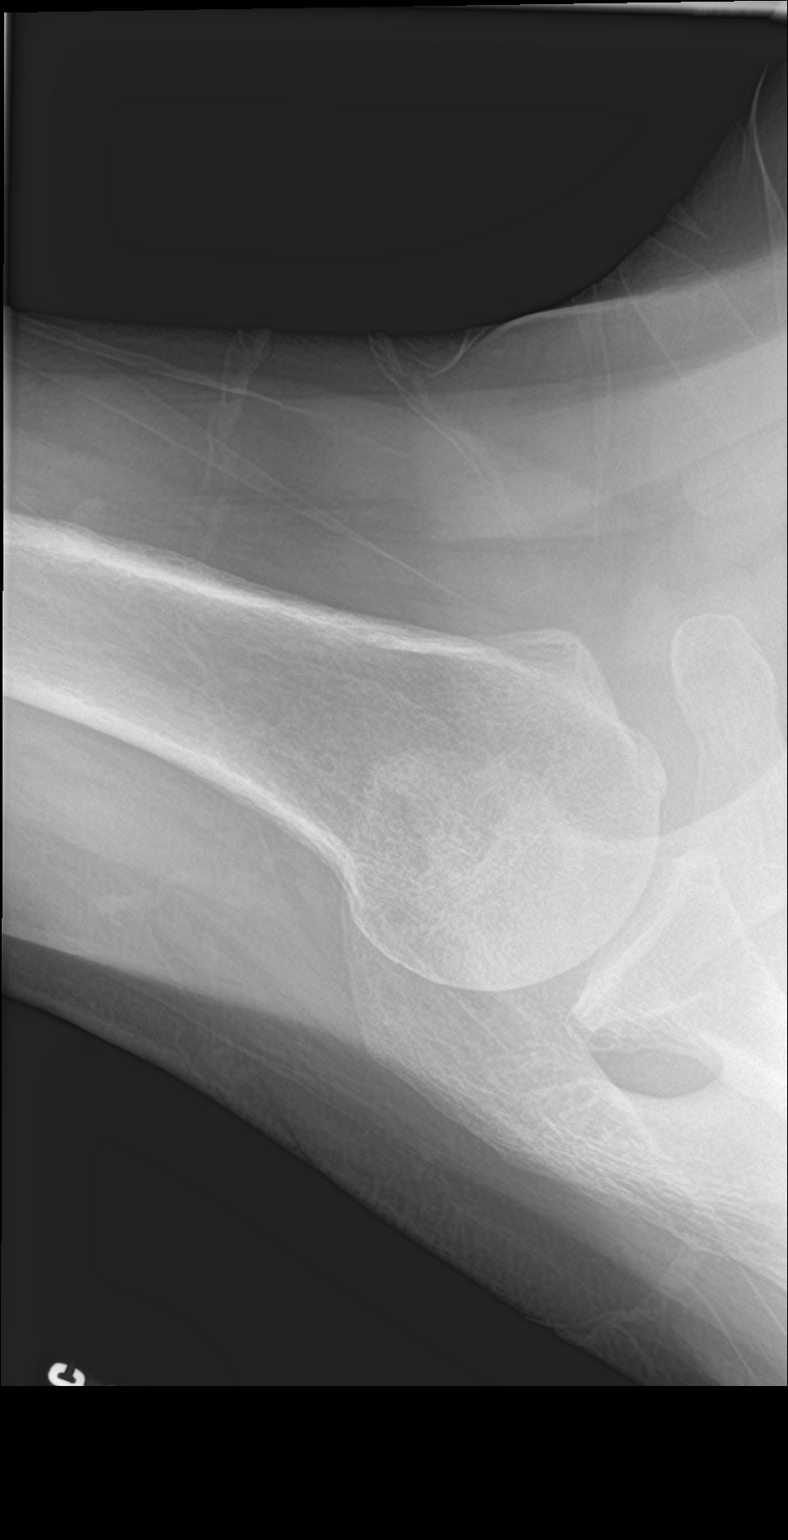

[3 of 3 positions shown; findings below may reference images not displayed]

FINDINGS: There is no evidence of fracture or dislocation. Moderate
osteoarthritis of the acromioclavicular joint. Soft tissues are
unremarkable.
IMPRESSION: No fracture or dislocation of the left shoulder.

## 2019-03-24 IMAGING — DX DG CERVICAL SPINE COMPLETE 4+V
6 series · 6 of 6 positions shown · non-contrast
Comparison: CT cervical spine 11/03/2015

CLINICAL DATA: Cervical neck pain after fall.

EXAM:
CERVICAL SPINE - COMPLETE 4+ VIEW

[c-spine lat]
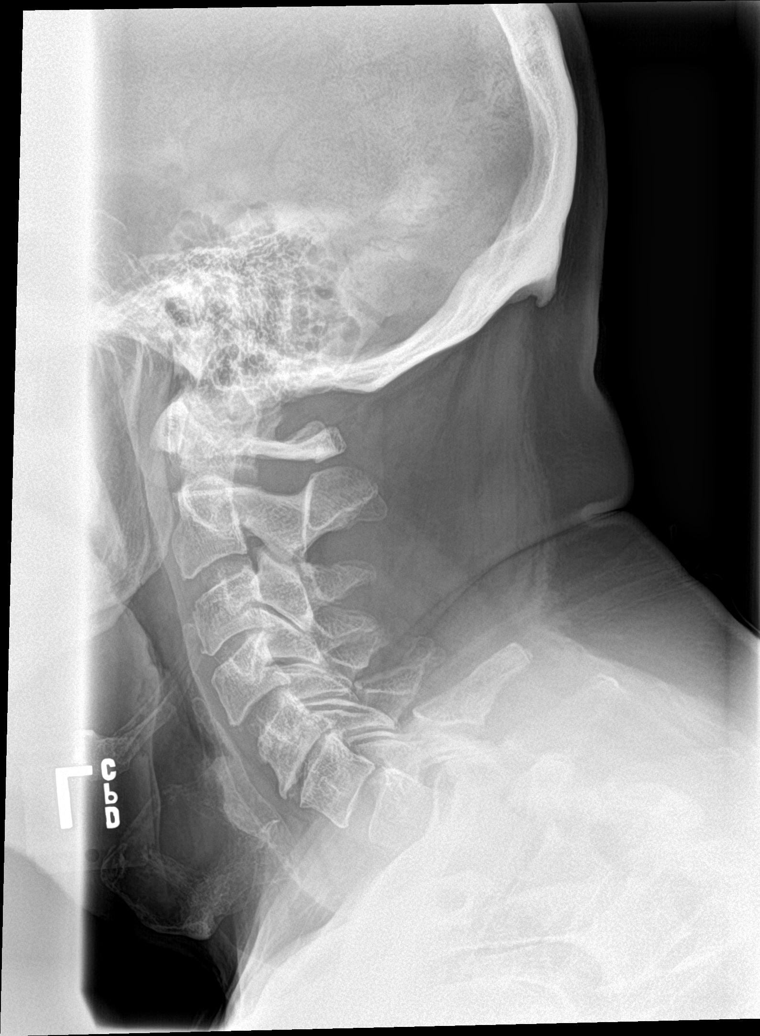

[c-spine obl (1 of 2)]
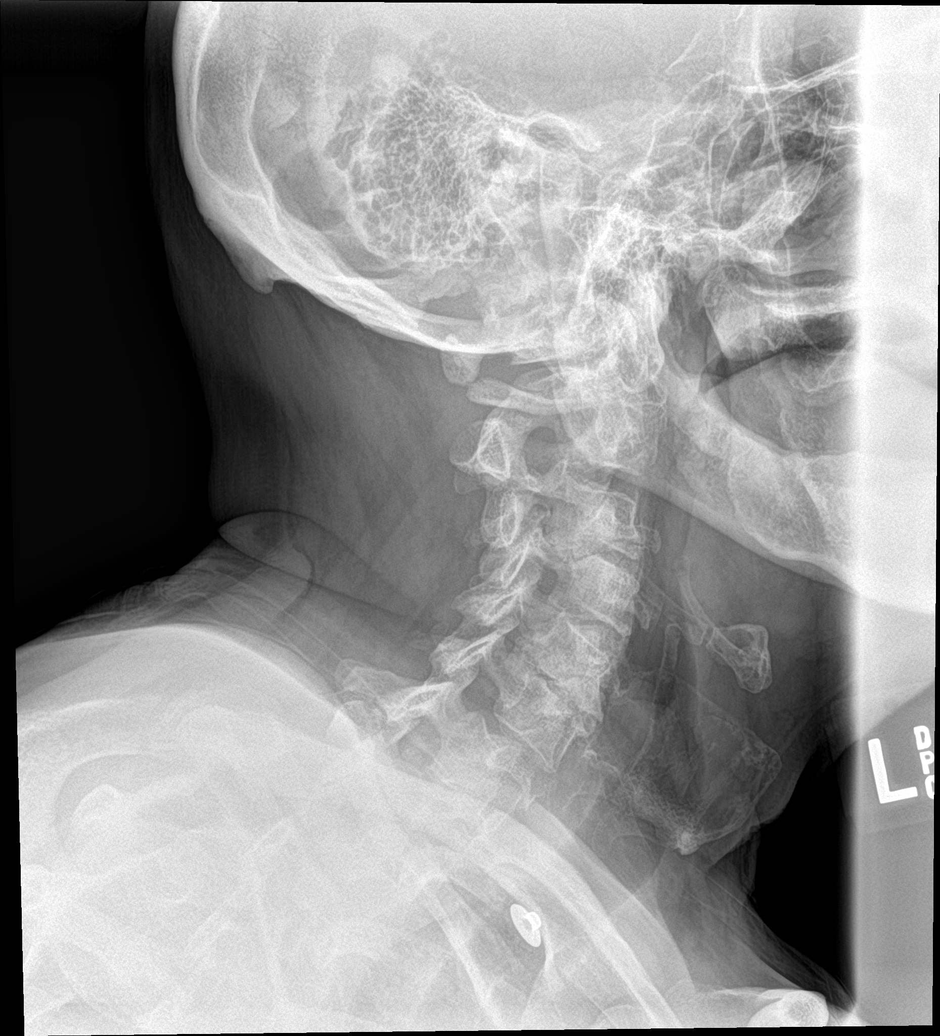

[c-spine obl (2 of 2)]
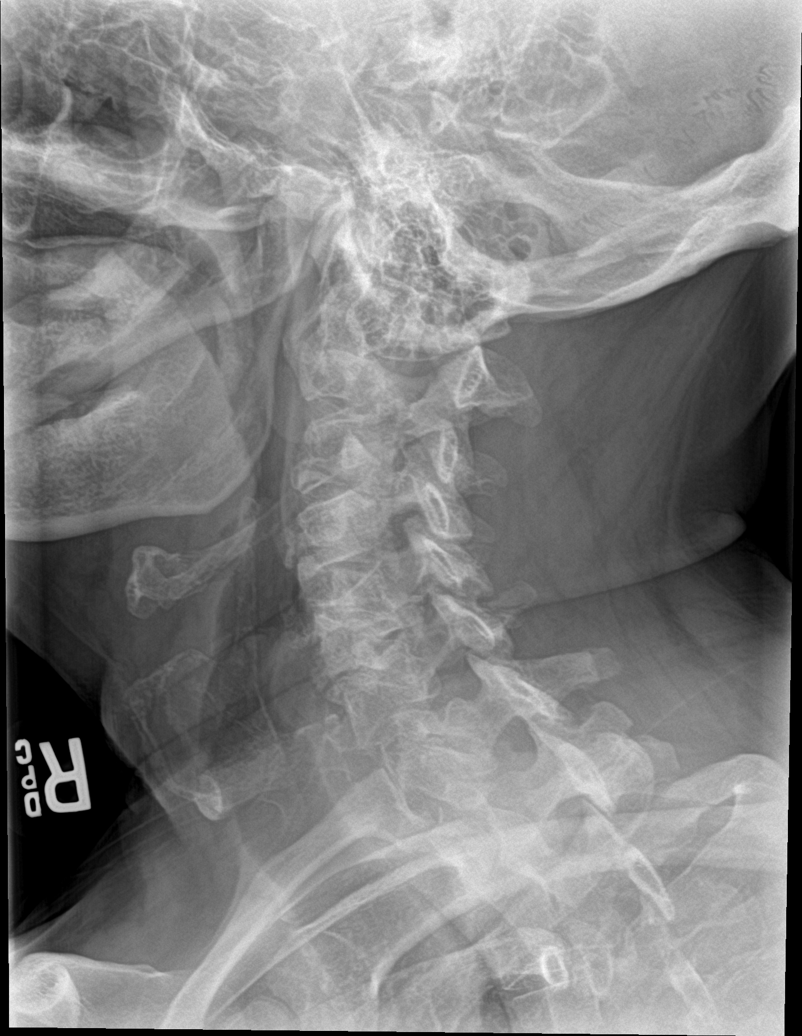

[c-spine ap]
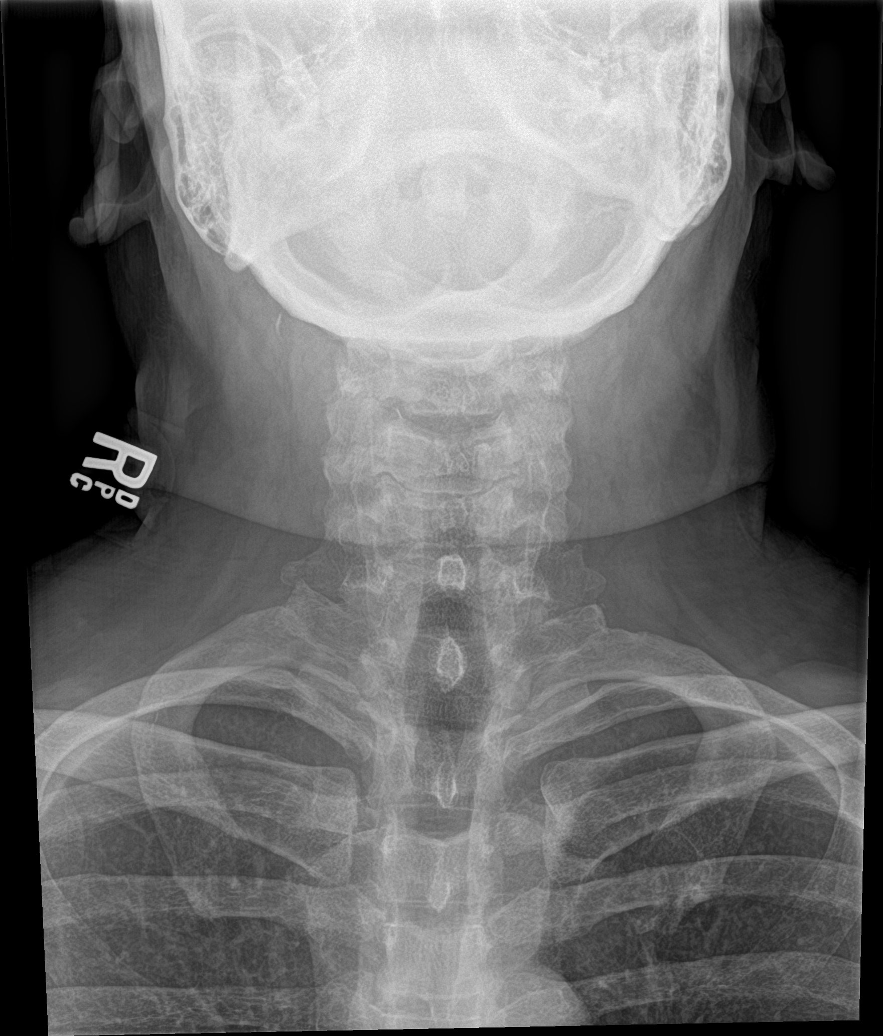

[c-spine open mouth]
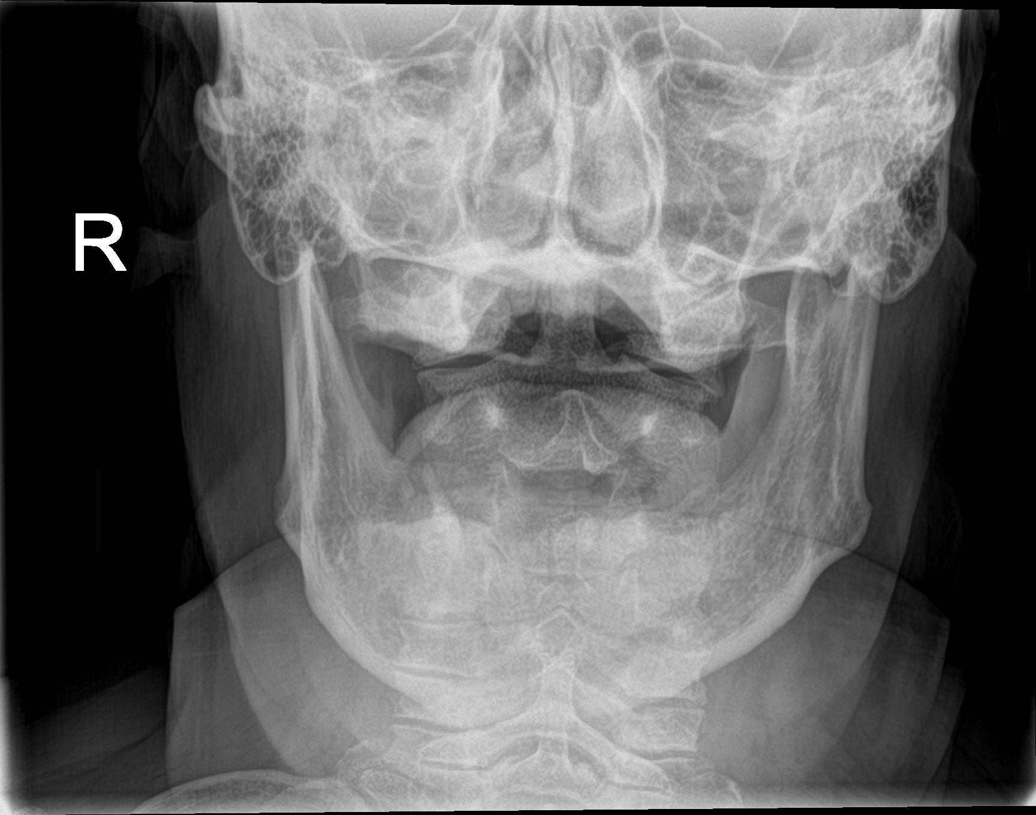

[c-spine swimmers trauma]
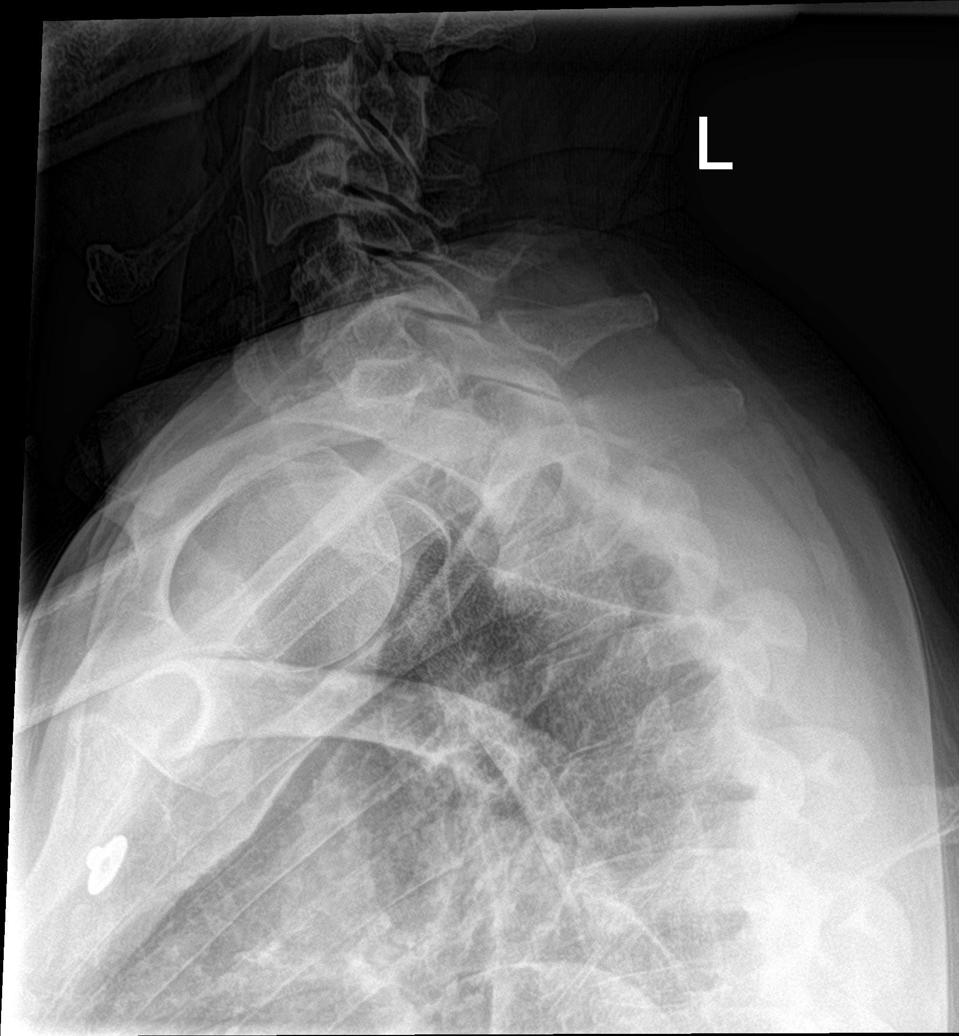

[6 of 6 positions shown; findings below may reference images not displayed]

FINDINGS: Chronic retrolisthesis of C3 on C4. Alignment is otherwise
maintained. No evidence of acute fracture. Vertebral body heights
are preserved. Disc space narrowing and endplate spurring is most
prominent at C5-C6. There is bony neural foraminal stenosis at C3-C4
and C4-C5 bilaterally. Lateral masses of C1 are well aligned on C2.
No prevertebral soft tissue edema.
IMPRESSION: Degenerative change in the cervical spine without radiographic
findings of acute fracture.

## 2019-03-24 IMAGING — DX DG KNEE COMPLETE 4+V*L*
4 series · 4 of 4 positions shown · non-contrast
Comparison: None.

CLINICAL DATA: Left knee pain after fall.

EXAM:
LEFT KNEE - COMPLETE 4+ VIEW

[knee ap (1 of 3)]
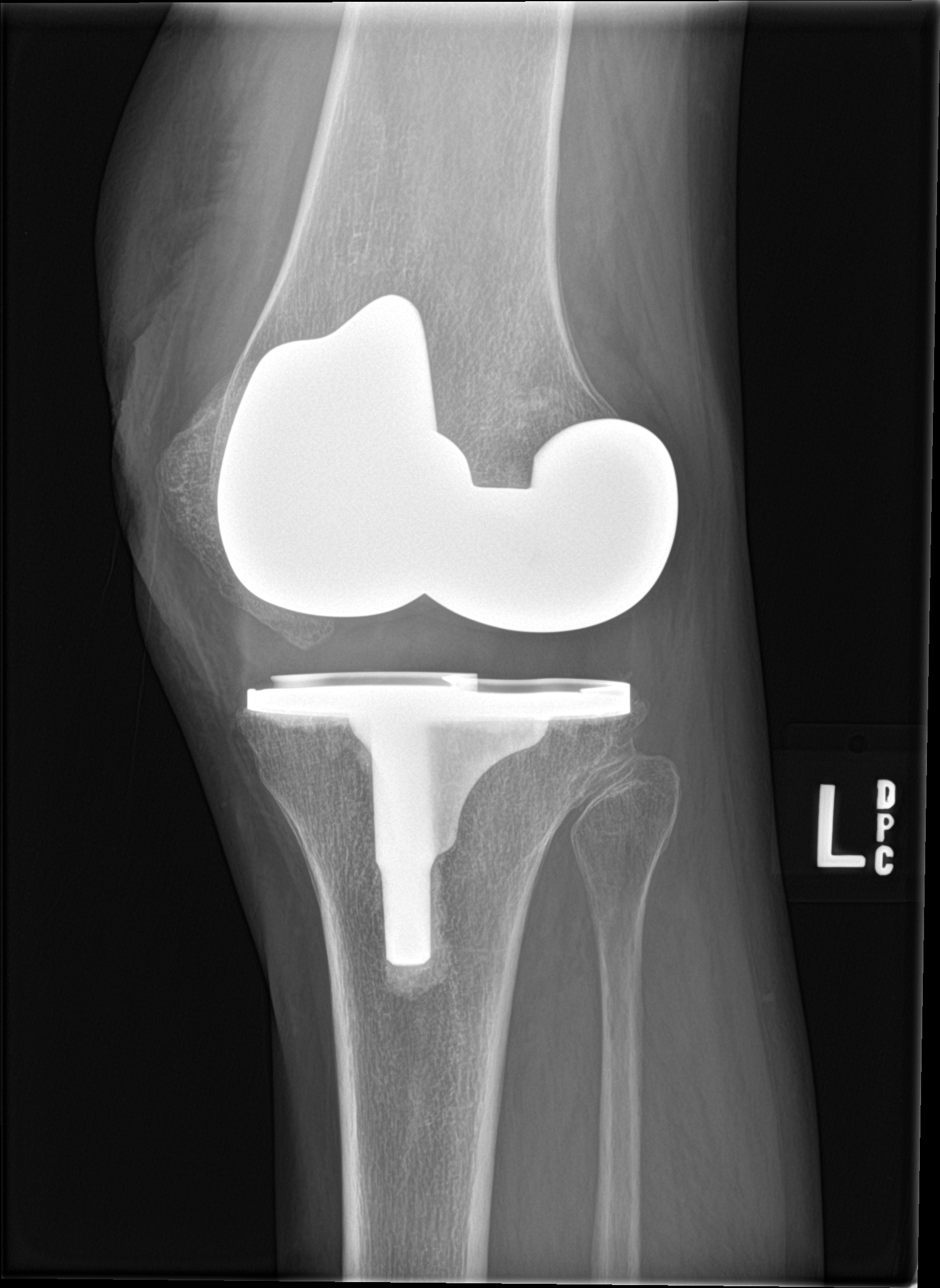

[knee lat]
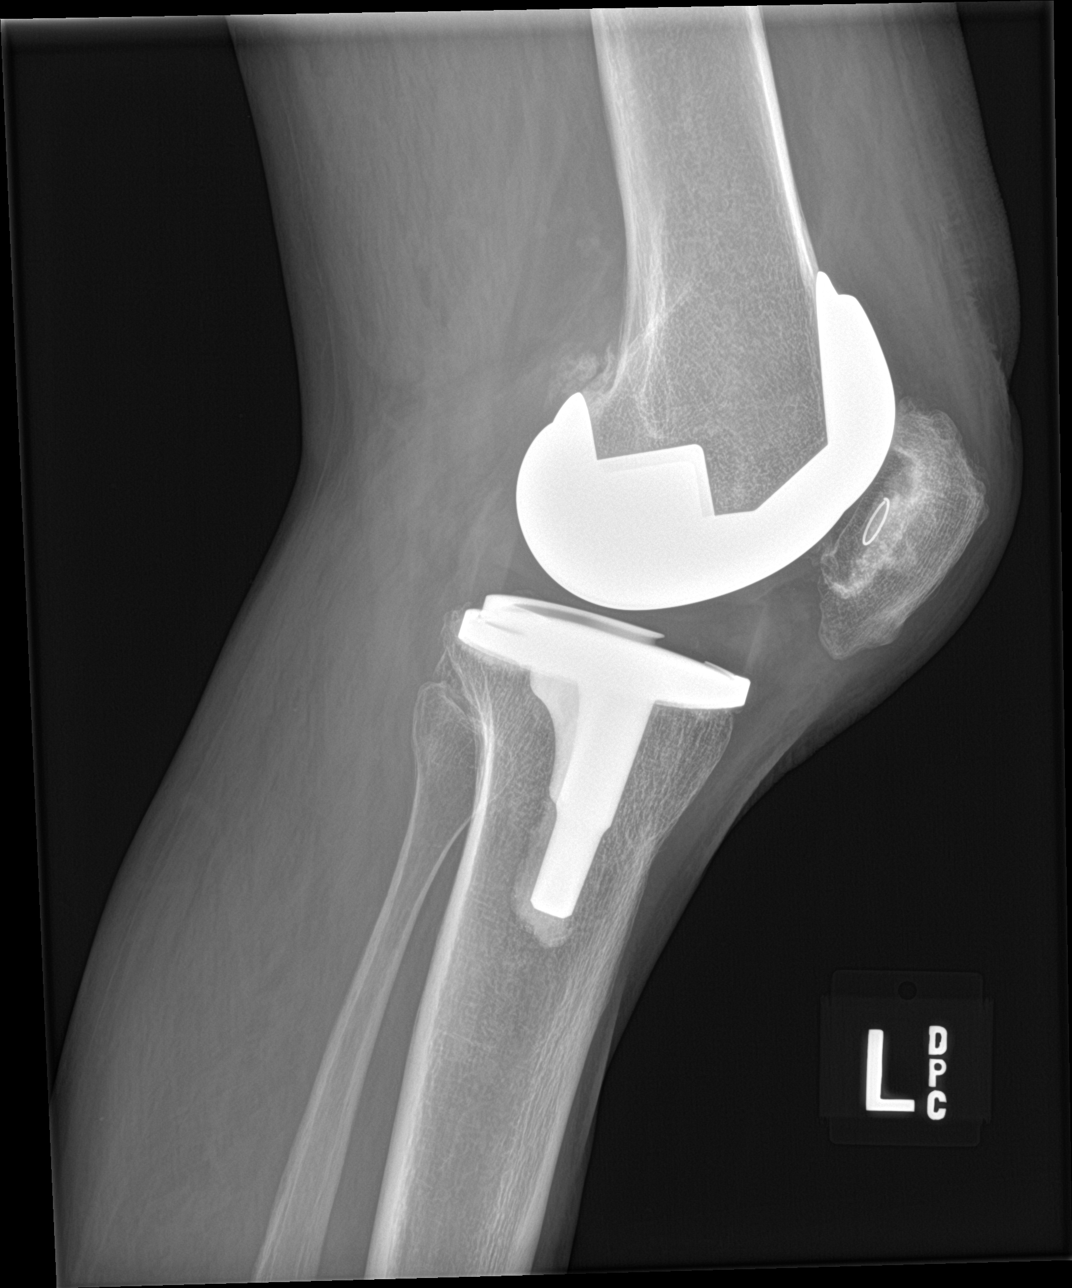

[knee ap (2 of 3)]
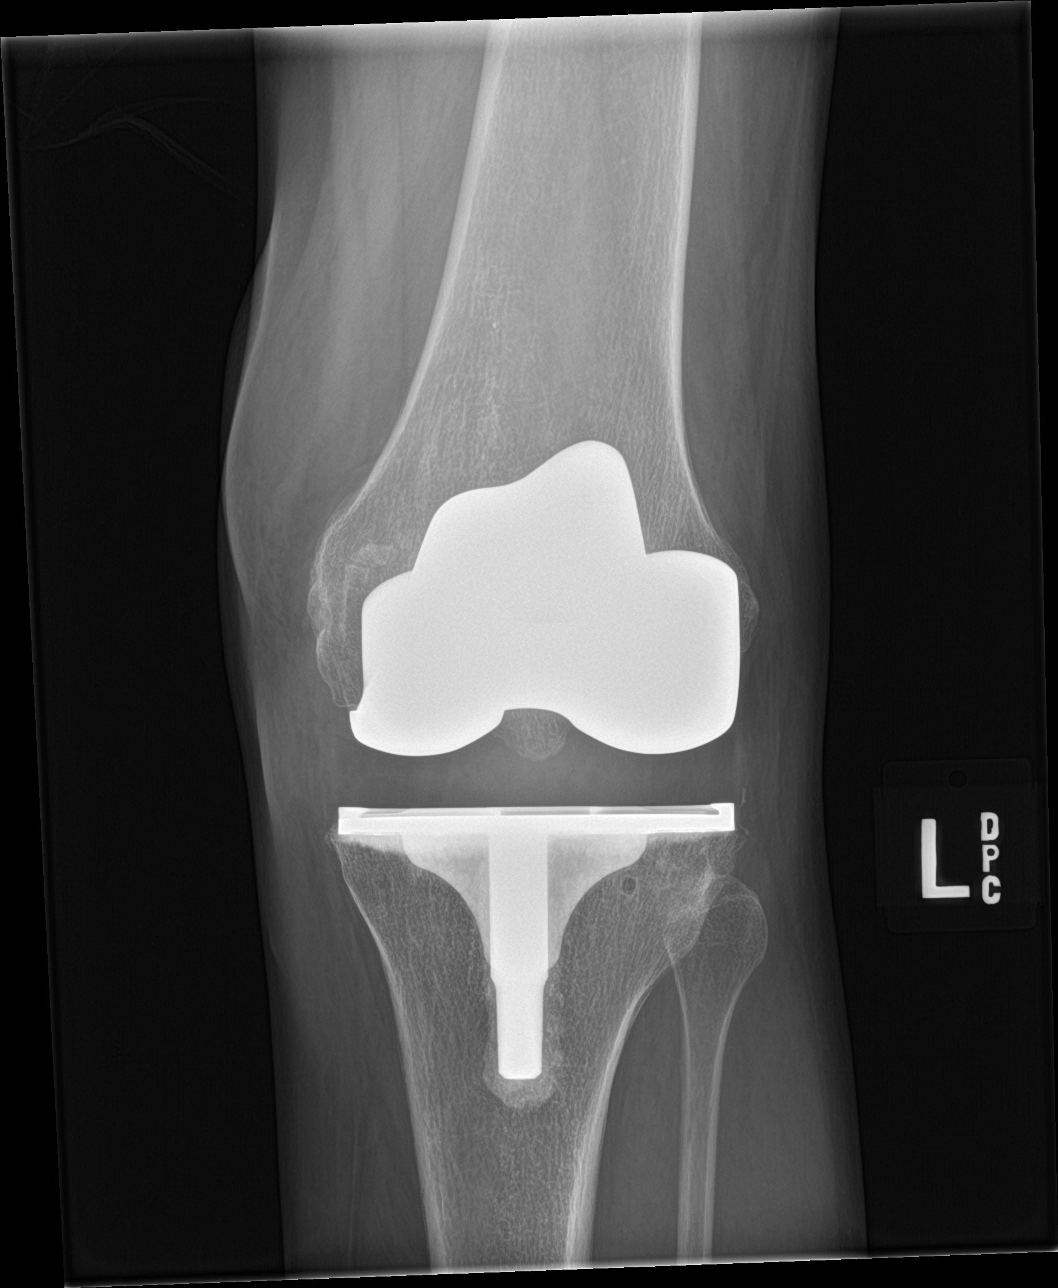

[knee ap (3 of 3)]
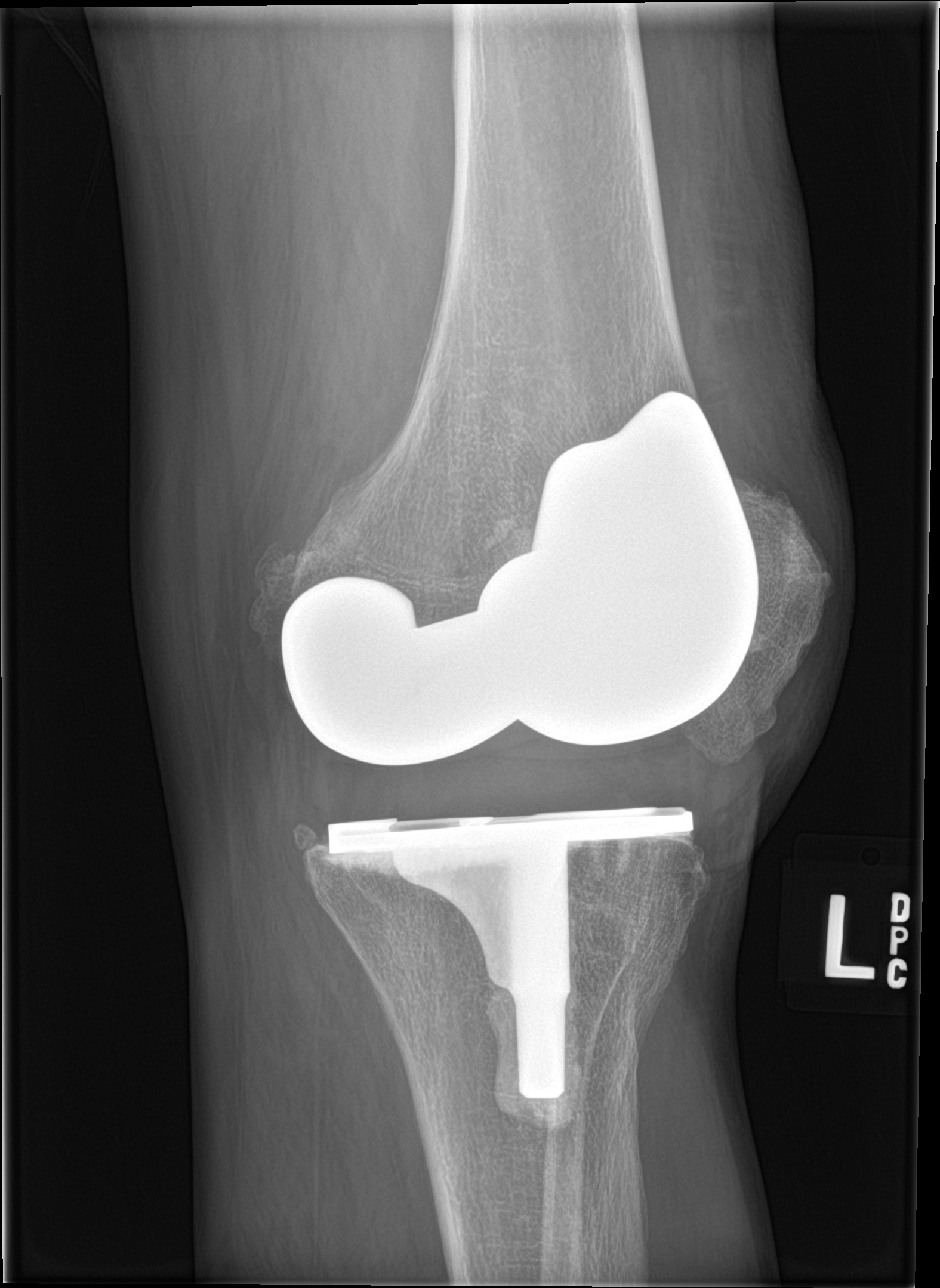

[4 of 4 positions shown; findings below may reference images not displayed]

FINDINGS: Left knee arthroplasty in expected alignment. No periprosthetic
lucency. There is been patellar resurfacing. Small joint effusion.
Mild anterior soft tissue edema.
IMPRESSION: Intact total knee arthroplasty without acute fracture. Mild soft
tissue edema.

## 2019-03-24 IMAGING — DX DG ELBOW COMPLETE 3+V*L*
4 series · 4 of 4 positions shown · non-contrast
Comparison: None.

CLINICAL DATA: Left elbow pain after fall.

EXAM:
LEFT ELBOW - COMPLETE 3+ VIEW

[elbow ap]
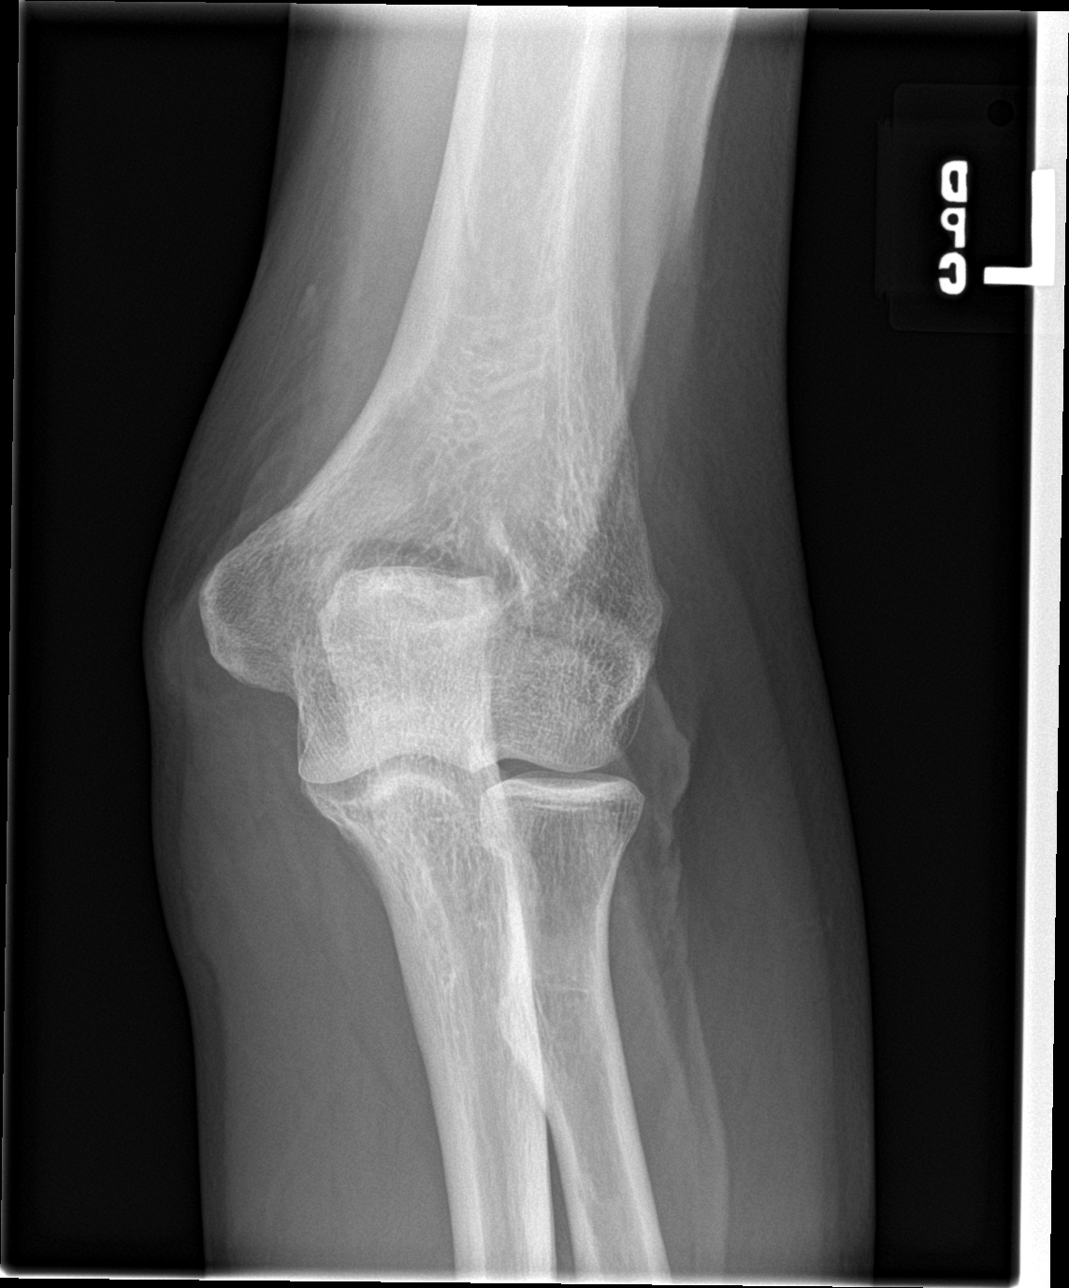

[elbow obl (1 of 2)]
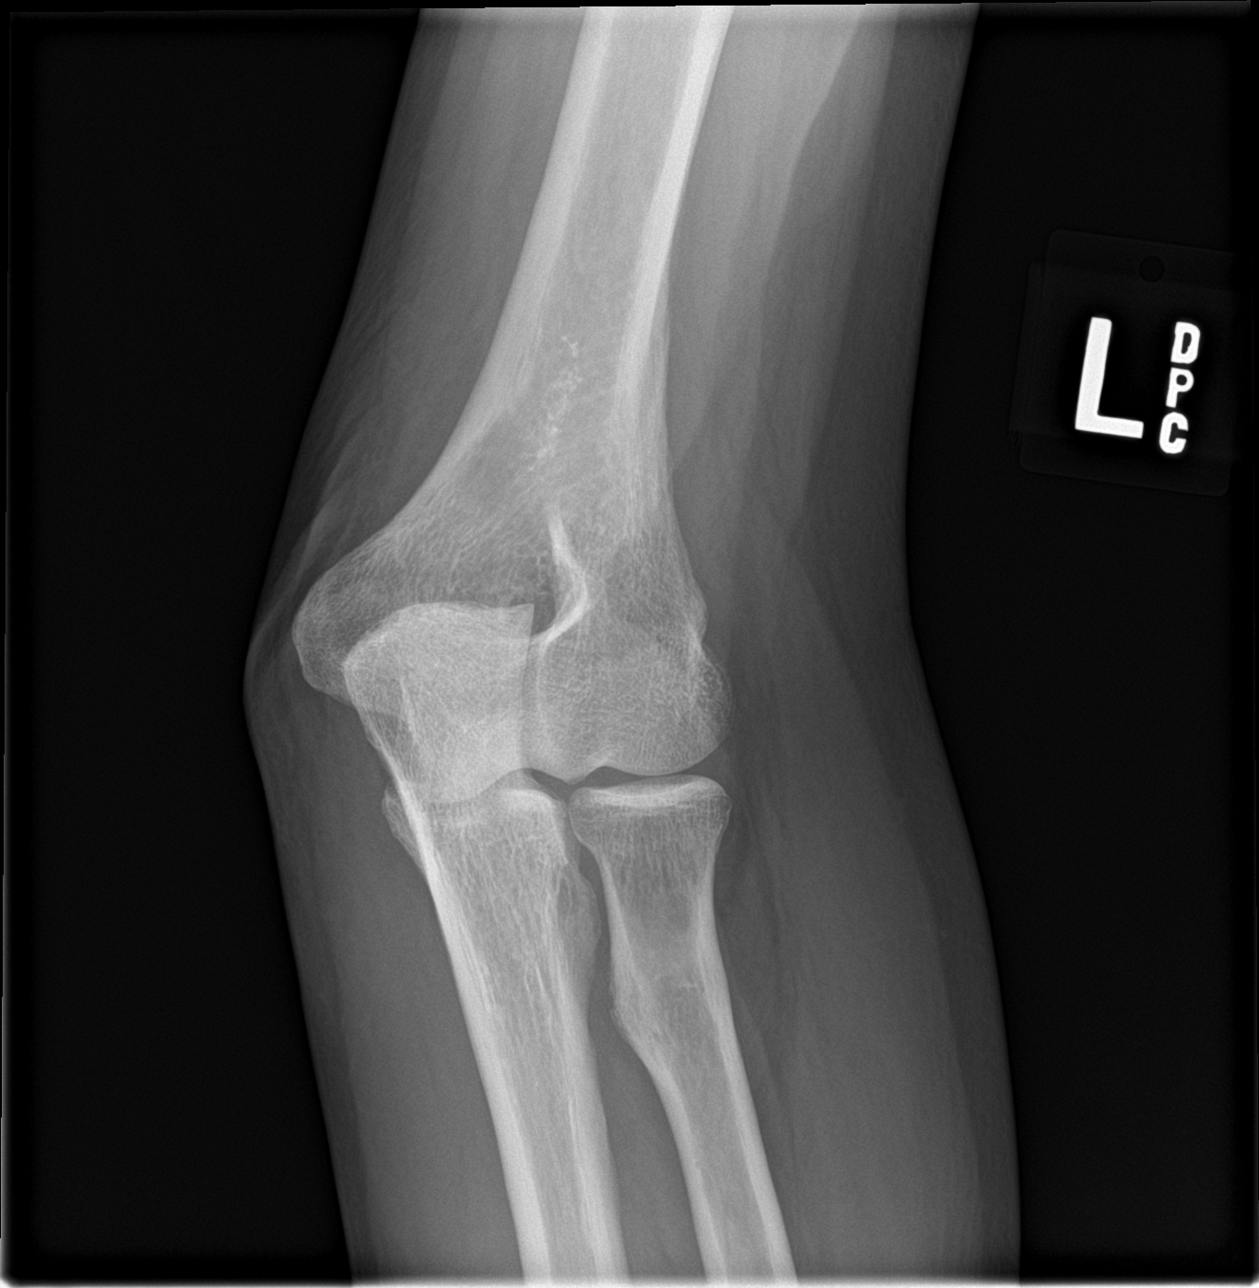

[elbow lat]
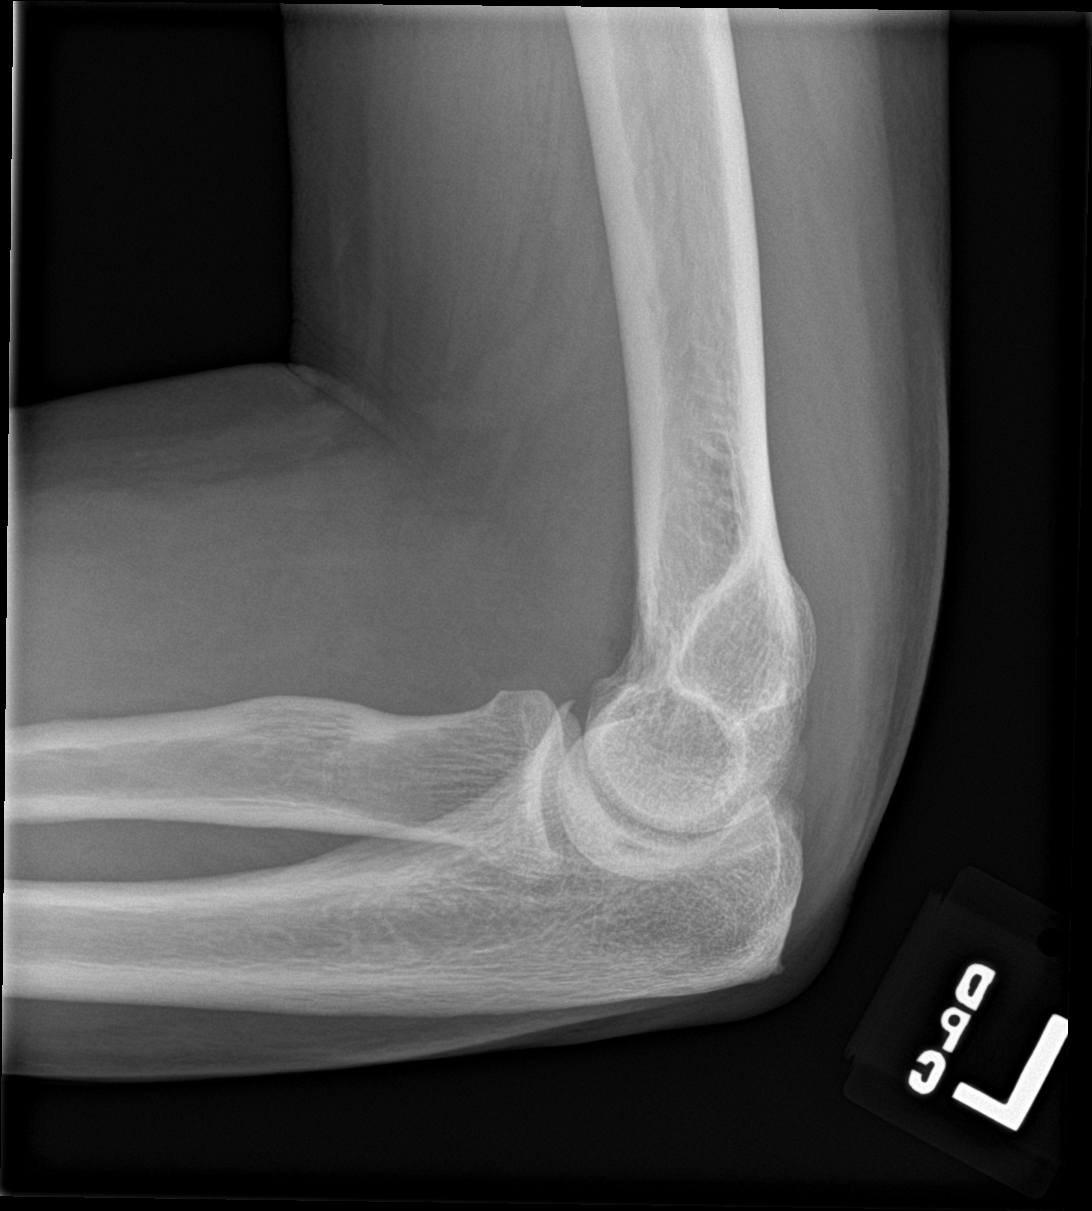

[elbow obl (2 of 2)]
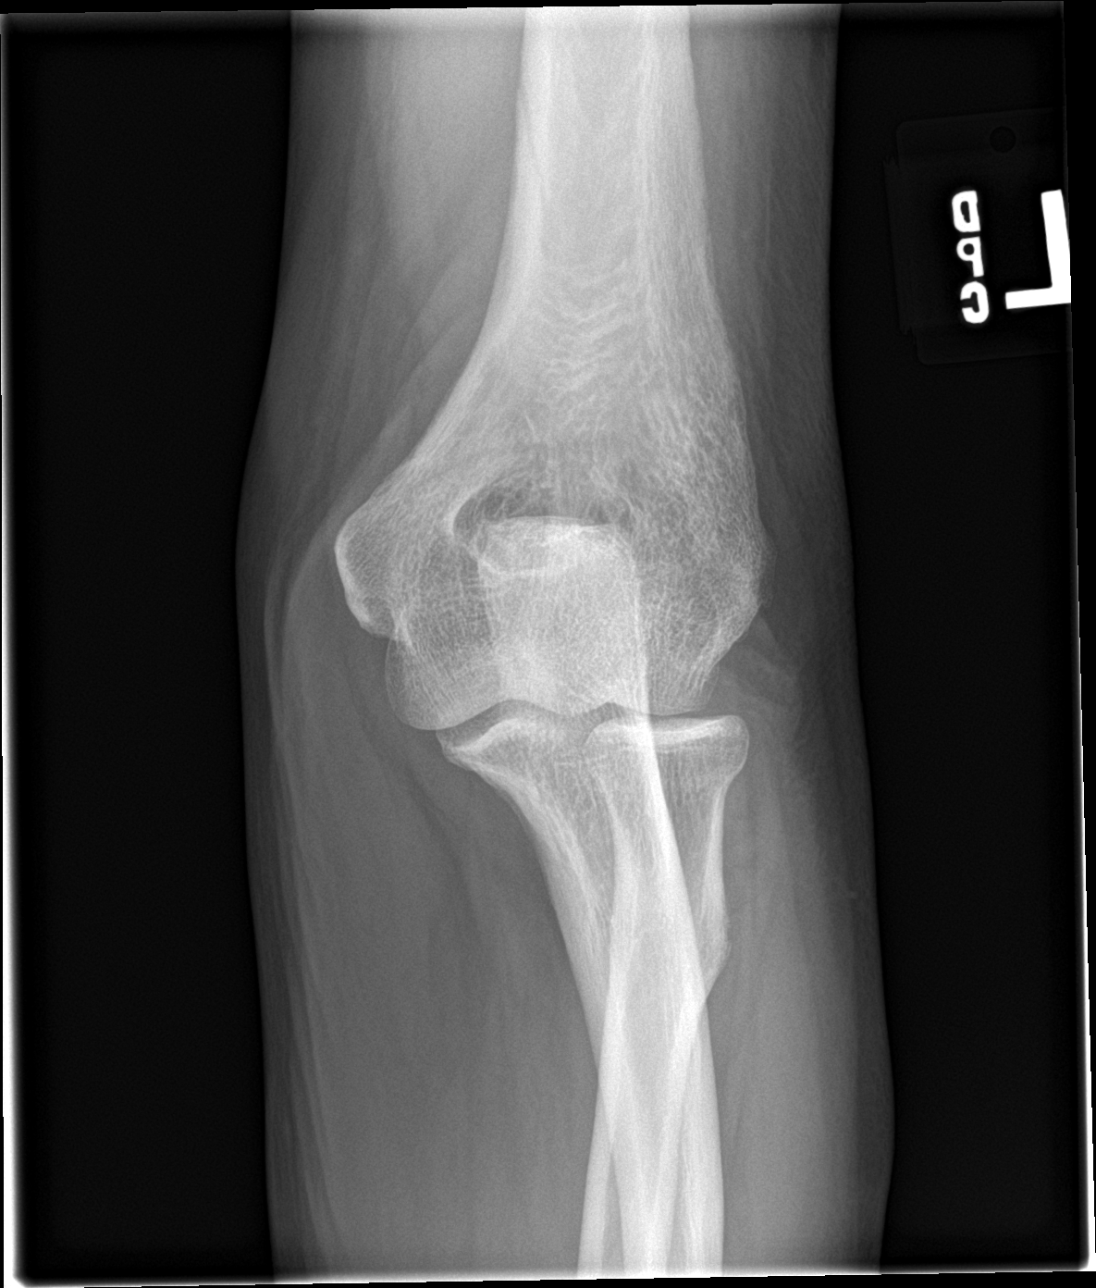

[4 of 4 positions shown; findings below may reference images not displayed]

FINDINGS: There is no evidence of fracture, dislocation, or joint effusion.
There is no evidence of arthropathy or other focal bone abnormality.
Soft tissues are unremarkable.
IMPRESSION: Negative radiographs of the left elbow.

## 2019-03-24 IMAGING — DX DG KNEE COMPLETE 4+V*R*
4 series · 4 of 4 positions shown · non-contrast
Comparison: None.

CLINICAL DATA: Right knee pain after fall.

EXAM:
RIGHT KNEE - COMPLETE 4+ VIEW

[knee ap (1 of 3)]
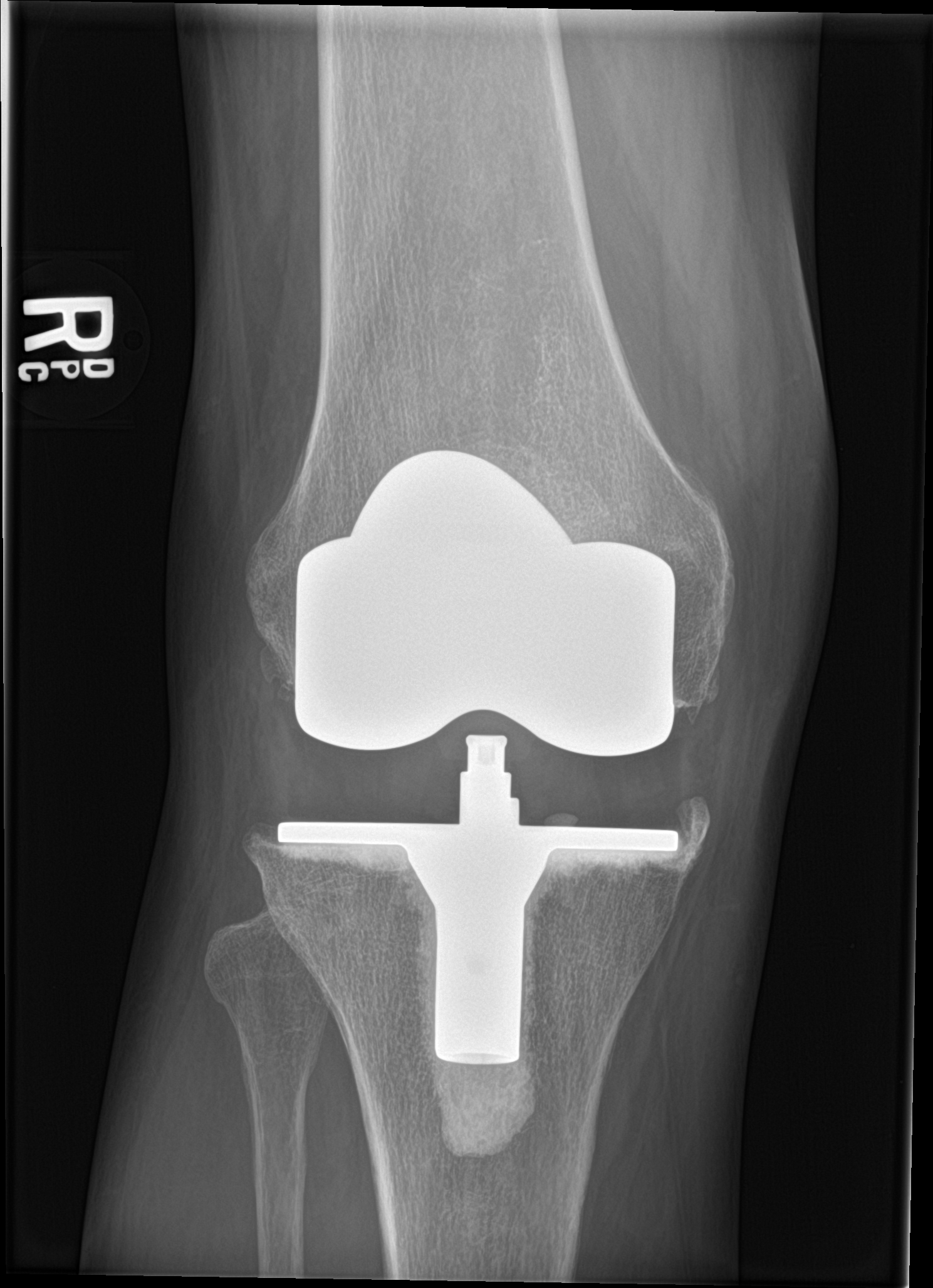

[knee lat]
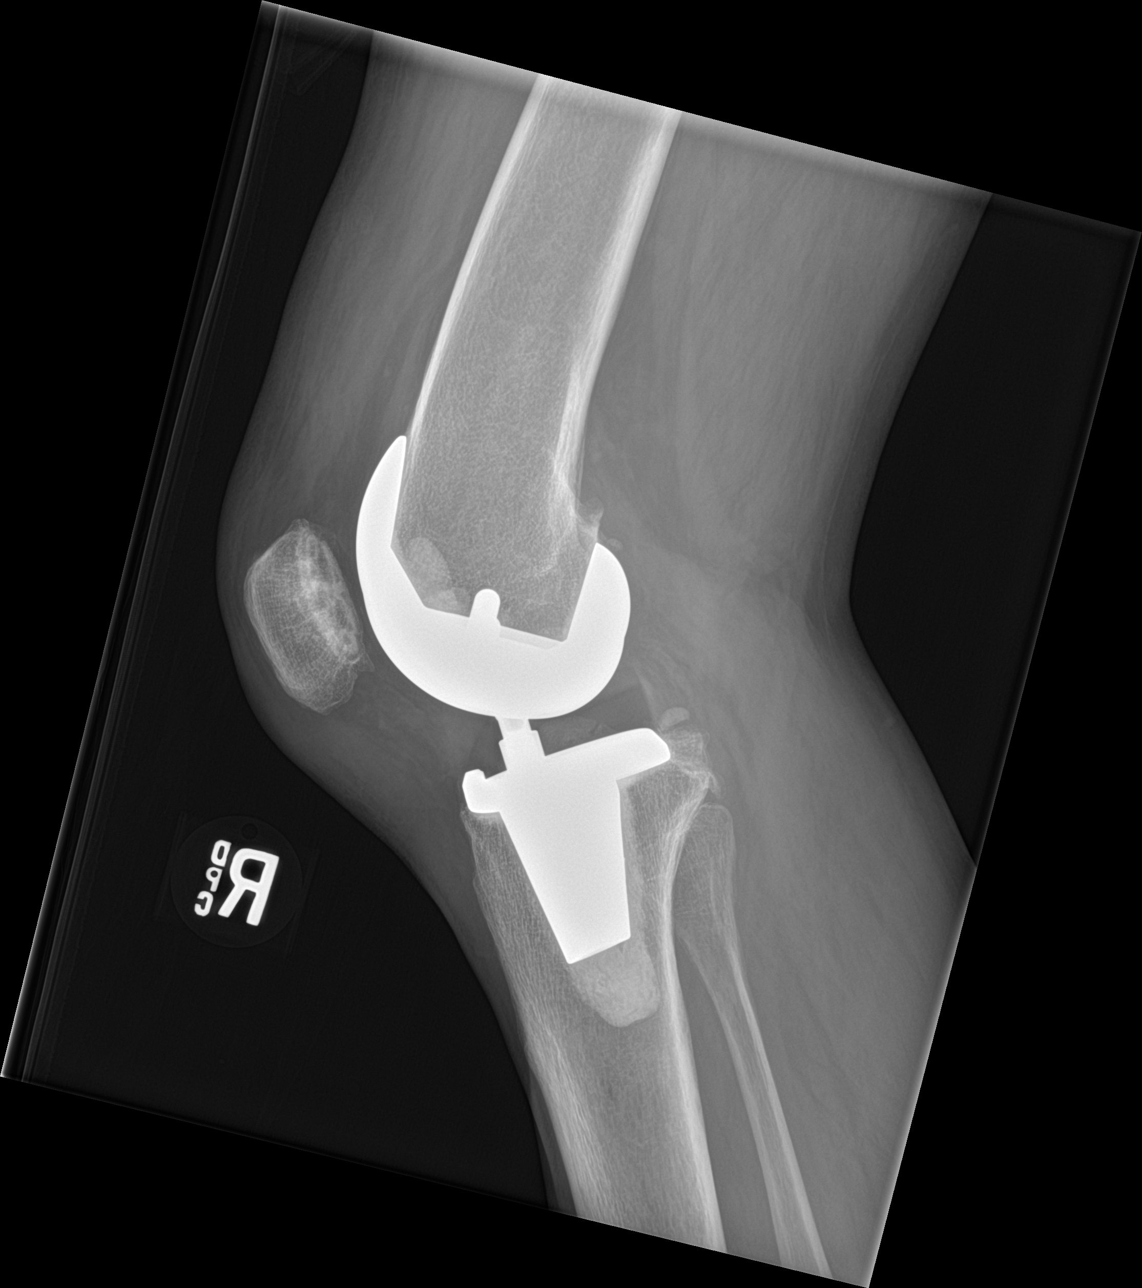

[knee ap (2 of 3)]
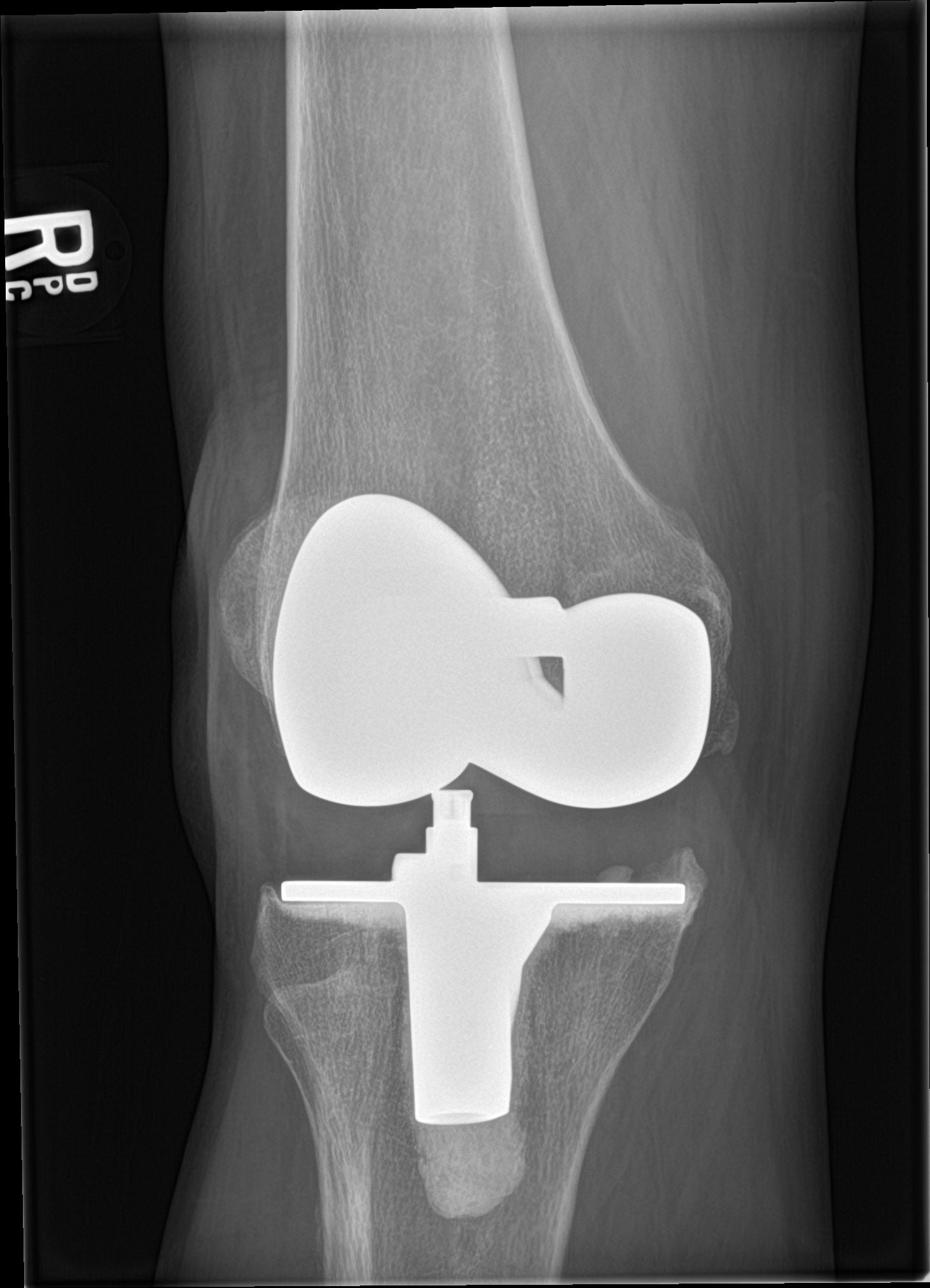

[knee ap (3 of 3)]
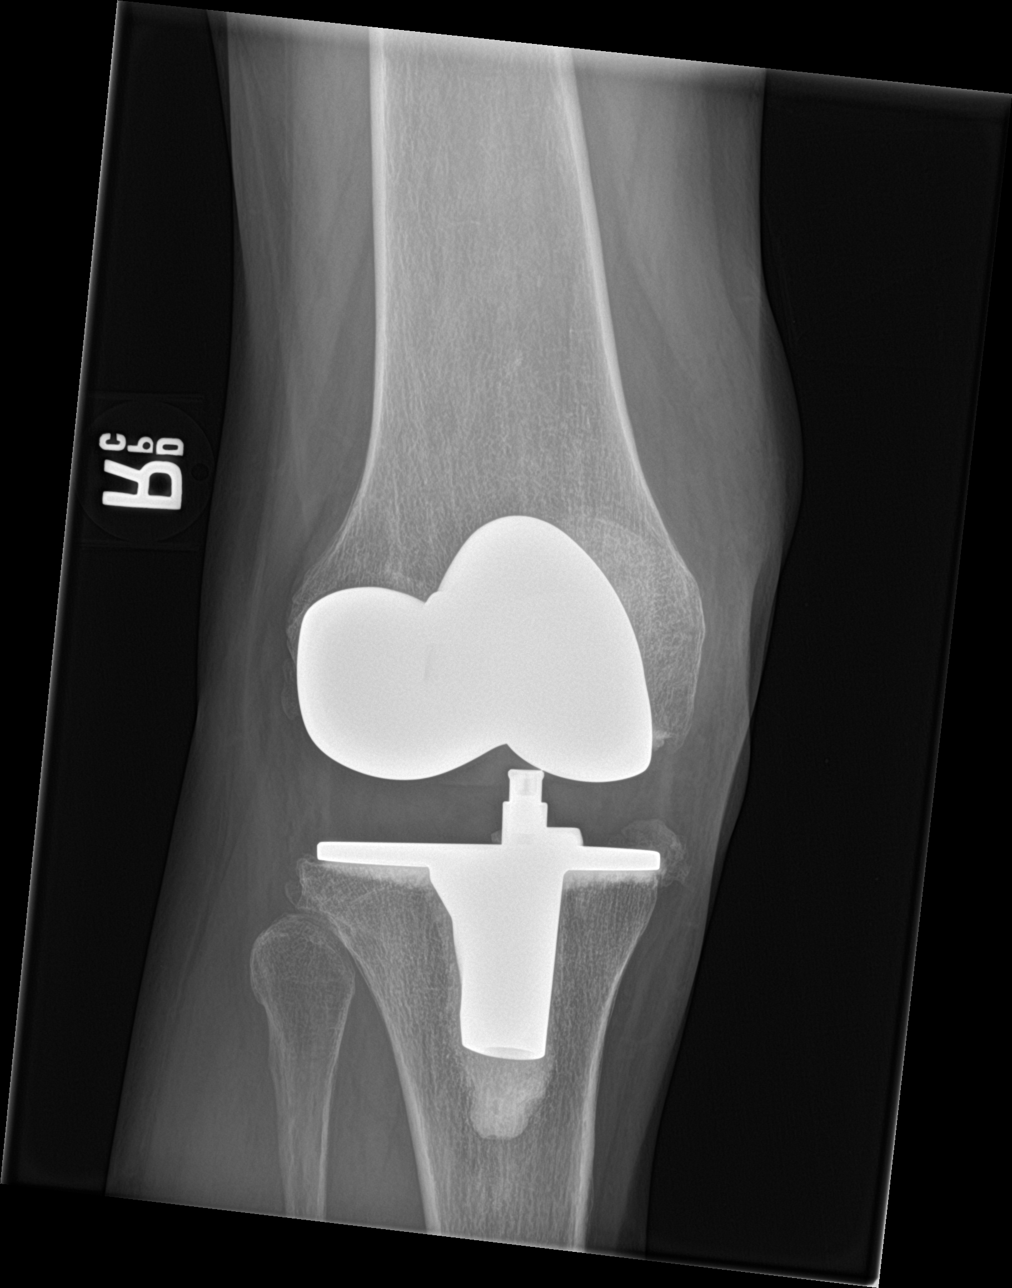

[4 of 4 positions shown; findings below may reference images not displayed]

FINDINGS: Right knee arthroplasty in expected alignment. No periprosthetic
lucency or fracture. There is been patellar resurfacing. Trace joint
effusion. There is anterior soft tissue edema.
IMPRESSION: Right knee arthroplasty without complication or acute fracture. Mild
soft tissue edema.

## 2019-03-24 MED ORDER — GABAPENTIN 300 MG PO CAPS: 300.0000 mg | ORAL_CAPSULE | Freq: Three times a day (TID) | ORAL | 1 refills | Status: DC

## 2019-03-24 MED ORDER — ZOLPIDEM TARTRATE 5 MG PO TABS
5.0000 mg | ORAL_TABLET | Freq: Every evening | ORAL | Status: DC | PRN
  Administered 2019-03-24: 5 mg via ORAL
  Filled 2019-03-24: qty 1

## 2019-03-24 MED ORDER — ACETAMINOPHEN 325 MG PO TABS: 325.0000 mg | ORAL_TABLET | Freq: Four times a day (QID) | ORAL | Status: DC | PRN

## 2019-03-24 MED ORDER — ASPIRIN 325 MG PO TBEC: 325.0000 mg | DELAYED_RELEASE_TABLET | Freq: Every day | ORAL | 0 refills | Status: DC

## 2019-03-24 MED ORDER — GLUCERNA SHAKE PO LIQD: 237.0000 mL | Freq: Three times a day (TID) | ORAL | 0 refills | Status: AC

## 2019-03-24 MED ORDER — OXYCODONE HCL 10 MG PO TABS: 10.0000 mg | ORAL_TABLET | ORAL | 0 refills | Status: DC | PRN

## 2019-03-24 NOTE — Progress Notes (Signed)
POD 2 Right Total Knee Revision. Doing well. PT progressing Still requiring IV pain medication. C/o difficulty sleeping Has not slept last 2 nights  VSS afebrile. Dressing Clean dry and intact. 0cc in canister    Discussed weaning to oral pain meds. Dc tomorrow. Will add Ambien for sleep

## 2019-03-24 NOTE — Care Management Important Message (Addendum)
Important Message  Patient Details  Name: [name] MRN: [id number] Date of Birth: [birth year]   Medicare Important Message Given:  Yes     Memory Argue 03/24/2019, 11:25 AM    IM GIVEN TO PATIENT

## 2019-03-24 NOTE — Progress Notes (Signed)
Physical Therapy Treatment Patient Details Name: [name] MRN: [id number] DOB: Jan 12, 1966 Today's Date: 03/24/2019    History of Present Illness Pt is a 54 y/o male s/p R total knee revision. PMH includes DM, chronic pain, CVA, polysubstance abuse, s/p R transmet amputation, s/p R THA, s/p R TKA, and s/p L TKA.     PT Comments    Pt with good ambulation distance this session and did not require physical assist for any aspect of mobility. Pt proficiently navigated stairs, showing readiness to enter home upon d/c from acute setting. PT offered pt exercise handout, pt defers due to having handouts from previous visits and due to "having physical therapy frequently at home. PT to continue to follow while acute.    Follow Up Recommendations  Follow surgeon's recommendation for DC plan and follow-up therapies;Supervision for mobility/OOB     Equipment Recommendations  None recommended by PT    Recommendations for Other Services       Precautions / Restrictions Precautions Precautions: Knee Precaution Booklet Issued: No Restrictions Weight Bearing Restrictions: Yes RLE Weight Bearing: Weight bearing as tolerated    Mobility  Bed Mobility Overal bed mobility: Needs Assistance Bed Mobility: Supine to Sit;Sit to Supine     Supine to sit: Min guard;HOB elevated Sit to supine: Min guard;HOB elevated   General bed mobility comments: Min guard for safety, HOB elevation and use of UEs to lift RLE during bed mobility.  Transfers Overall transfer level: Needs assistance Equipment used: Rolling walker (2 wheeled) Transfers: Sit to/from Stand Sit to Stand: Min guard;From elevated surface         General transfer comment: Min guard for safety, proper and placement when rising/sitting. Sit to stand x2, from EOB and from recliner.  Ambulation/Gait Ambulation/Gait assistance: Supervision Gait Distance (Feet): 220 Feet Assistive device: Rolling walker (2 wheeled) Gait  Pattern/deviations: Decreased weight shift to right;Step-through pattern;Decreased stride length;Antalgic Gait velocity: decr   General Gait Details: Supervision for safety, verbal cuing for R foot clearance and placement in RW.   Stairs Stairs: Yes Stairs assistance: Min guard Stair Management: Two rails;Step to pattern;Forwards Number of Stairs: 3 General stair comments: 2 x 3 steps, verbal cuing for sequencing, step-to pattern. Pt with proficient stair navigation at this time.   Wheelchair Mobility    Modified Rankin (Stroke Patients Only)       Balance Overall balance assessment: Needs assistance Sitting-balance support: No upper extremity supported;Feet supported Sitting balance-Leahy Scale: Good Sitting balance - Comments: able to sit EOB without PT assist   Standing balance support: Bilateral upper extremity supported;During functional activity Standing balance-Leahy Scale: Poor Standing balance comment: Reliant on BUE support                             Cognition Arousal/Alertness: Awake/alert Behavior During Therapy: WFL for tasks assessed/performed Overall Cognitive Status: Within Functional Limits for tasks assessed                                        Exercises      General Comments        Pertinent Vitals/Pain Pain Assessment: Faces Faces Pain Scale: Hurts little more Pain Location: R knee Pain Descriptors / Indicators: Aching;Sore Pain Intervention(s): Limited activity within patient's tolerance;Monitored during session;Repositioned;Premedicated before session    Home Living  Prior Function            PT Goals (current goals can now be found in the care plan section) Acute Rehab PT Goals Patient Stated Goal: to decrease pain  PT Goal Formulation: With patient Time For Goal Achievement: 04/05/19 Potential to Achieve Goals: Good Progress towards PT goals: Progressing toward goals     Frequency    7X/week      PT Plan Current plan remains appropriate    Co-evaluation              AM-PAC PT "6 Clicks" Mobility   Outcome Measure  Help needed turning from your back to your side while in a flat bed without using bedrails?: A Little Help needed moving from lying on your back to sitting on the side of a flat bed without using bedrails?: A Little Help needed moving to and from a bed to a chair (including a wheelchair)?: A Little Help needed standing up from a chair using your arms (e.g., wheelchair or bedside chair)?: A Little Help needed to walk in hospital room?: A Little Help needed climbing 3-5 steps with a railing? : A Little 6 Click Score: 18    End of Session Equipment Utilized During Treatment: Gait belt Activity Tolerance: Patient limited by pain Patient left: in bed;with call bell/phone within reach Nurse Communication: Mobility status PT Visit Diagnosis: Difficulty in walking, not elsewhere classified (R26.2);Muscle weakness (generalized) (M62.81);Pain Pain - Right/Left: Right Pain - part of body: Knee     Time: [id number] PT Time Calculation (min) (ACUTE ONLY): 28 min  Charges:  $Gait Training: 23-37 mins                     [name], PT Acute Rehabilitation Services Pager [phone]  Office [phone]   Roxine Caddy D Elonda Husky 03/24/2019, 5:27 PM

## 2019-03-24 NOTE — Progress Notes (Signed)
Occupational Therapy Treatment Patient Details Name: [name] MRN: [id number] DOB: Jun 07, 1965 Today's Date: 03/24/2019    History of present illness Pt is a 54 y/o male s/p R total knee revision. PMH includes DM, chronic pain, CVA, polysubstance abuse, s/p R transmet amputation, s/p R THA, s/p R TKA, and s/p L TKA.    OT comments  Pt performed toilet transfer at min guard level and verbalizes understanding of shower transfer.  He normally performs shower transfer differently; recommended the way I educated him on for increased safety and comfort.     Follow Up Recommendations  Supervision/Assistance - 24 hour    Equipment Recommendations  None recommended by OT    Recommendations for Other Services      Precautions / Restrictions Precautions Precautions: Knee vac Precaution Booklet Issued: No Precaution Comments: Pt verbalizes knee precautions Restrictions RLE Weight Bearing: Weight bearing as tolerated       Mobility Bed Mobility         Supine to sit: Min guard Sit to supine: Min assist   General bed mobility comments: assist for RLE back into bed.  Pt attempted to use arms to self assist, but he couldn't get his leg completely onto bed  Transfers   Equipment used: Rolling walker (2 wheeled)   Sit to Stand: Min guard         General transfer comment: for safety    Balance                                           ADL either performed or assessed with clinical judgement   ADL       Grooming: Supervision/safety;Standing                   Toilet Transfer: Min guard;Ambulation;BSC;RW             General ADL Comments: ambulated to bathroom and performed toilet transfer with 3:1 over toilet.  Pt usually steps into shower holding to shower frame above his head. Educated on backing in, using RW for leverage and support. Pt verbalizes understanding, but he did not want to practice this     Vision       Perception      Praxis      Cognition Arousal/Alertness: Awake/alert Behavior During Therapy: WFL for tasks assessed/performed Overall Cognitive Status: Within Functional Limits for tasks assessed                                          Exercises     Shoulder Instructions       General Comments      Pertinent Vitals/ Pain       Pain Assessment: Faces Faces Pain Scale: Hurts little more Pain Location: R knee Pain Descriptors / Indicators: Aching;Sore Pain Intervention(s): Limited activity within patient's tolerance;Monitored during session;Premedicated before session;Repositioned  Home Living                                          Prior Functioning/Environment              Frequency           Progress Toward  Goals  OT Goals(current goals can now be found in the care plan section)  Progress towards OT goals: Progressing toward goals(no further OT is needed)     Plan      Co-evaluation                 AM-PAC OT "6 Clicks" Daily Activity     Outcome Measure   Help from another person eating meals?: None Help from another person taking care of personal grooming?: A Little Help from another person toileting, which includes using toliet, bedpan, or urinal?: A Little Help from another person bathing (including washing, rinsing, drying)?: A Little Help from another person to put on and taking off regular upper body clothing?: A Little Help from another person to put on and taking off regular lower body clothing?: A Little 6 Click Score: 19    End of Session    OT Visit Diagnosis: Pain;Muscle weakness (generalized) (M62.81) Pain - Right/Left: Right Pain - part of body: Knee   Activity Tolerance Patient tolerated treatment well   Patient Left in bed;with call bell/phone within reach;with bed alarm set   Nurse Communication          [id number] OT Time Calculation (min): 18 min  Charges: OT General Charges $OT  Visit: 1 Visit OT Treatments $Self Care/Home Management : 8-22 mins  Zaniel Marineau S, OTR/L Acute Rehabilitation Services 03/24/2019   [place] 03/24/2019, 8:28 AM

## 2019-03-24 NOTE — Plan of Care (Signed)
  Problem: Pain Managment: Goal: General experience of comfort will improve Outcome: Progressing   

## 2019-03-24 NOTE — Progress Notes (Signed)
Physical Therapy Treatment Patient Details Name: [name] MRN: [id number] DOB: 1965-10-31 Today's Date: 03/24/2019    History of Present Illness Pt is a 54 y/o male s/p R total knee revision. PMH includes DM, chronic pain, CVA, polysubstance abuse, s/p R transmet amputation, s/p R THA, s/p R TKA, and s/p L TKA.     PT Comments    Pt with good gait distance this session, PT emphasizing form and normal flexion/extension during swing and stance phase. Pt tends to keep RLE stiff in stance phase for stability, but cues self to correct this. Pt also tolerated exercises well, with light physical assist from PT to perform. PT to continue to follow acutely, will see for second session today to practice stair navigation.     Follow Up Recommendations  Follow surgeon's recommendation for DC plan and follow-up therapies;Supervision for mobility/OOB     Equipment Recommendations  None recommended by PT    Recommendations for Other Services       Precautions / Restrictions Precautions Precautions: Knee Precaution Booklet Issued: No Precaution Comments: Pt verbalizes knee precautions Restrictions Weight Bearing Restrictions: No RLE Weight Bearing: Weight bearing as tolerated    Mobility  Bed Mobility Overal bed mobility: Needs Assistance Bed Mobility: Supine to Sit;Sit to Supine     Supine to sit: Min assist;HOB elevated Sit to supine: Min assist;HOB elevated   General bed mobility comments: min assist for RLE lifting and translation in and out of bed, increased time to come to EOB with use of bedrails and HOB elevation to assist.  Transfers Overall transfer level: Needs assistance Equipment used: Rolling walker (2 wheeled) Transfers: Sit to/from Stand Sit to Stand: Min guard;From elevated surface         General transfer comment: Min guard for safety, slow to rise and steady.  Ambulation/Gait Ambulation/Gait assistance: Min guard Gait Distance (Feet): 150 Feet Assistive  device: Rolling walker (2 wheeled) Gait Pattern/deviations: Decreased weight shift to right;Step-through pattern;Decreased stride length;Antalgic Gait velocity: decr   General Gait Details: Min guard for safety, verbal cuing for relaxing shoulders, placement in RW, upright posture, and knee flexion/extension during progression of stance phase. Pt tends to keep RLE stiff for stability, pt good at cuing self to correct.   Stairs             Wheelchair Mobility    Modified Rankin (Stroke Patients Only)       Balance Overall balance assessment: Needs assistance Sitting-balance support: No upper extremity supported;Feet supported Sitting balance-Leahy Scale: Good Sitting balance - Comments: able to sit EOB without PT assist   Standing balance support: Bilateral upper extremity supported;During functional activity Standing balance-Leahy Scale: Poor Standing balance comment: Reliant on BUE support                             Cognition Arousal/Alertness: Awake/alert Behavior During Therapy: WFL for tasks assessed/performed Overall Cognitive Status: Within Functional Limits for tasks assessed                                        Exercises Total Joint Exercises Ankle Circles/Pumps: AROM;Both;Supine;15 reps Quad Sets: AROM;10 reps;Supine;Right Heel Slides: AAROM;10 reps;Supine;Right Hip ABduction/ADduction: AAROM;Right;10 reps;Supine Straight Leg Raises: AAROM;Right;5 reps;Supine Goniometric ROM: knee aarom 10-45*, limited by pain and stiffness    General Comments General comments (skin integrity, edema, etc.): wound vac unplugged  during gait, cleaned with alcohol swabs for reconnection. Utilized pt orthotic shoes due to bilateral foot sores      Pertinent Vitals/Pain Pain Assessment: 0-10 Pain Score: 7  Faces Pain Scale: Hurts little more Pain Location: R knee Pain Descriptors / Indicators: Aching;Sore Pain Intervention(s): Limited  activity within patient's tolerance;Monitored during session;Premedicated before session;Repositioned    Home Living                      Prior Function            PT Goals (current goals can now be found in the care plan section) Acute Rehab PT Goals Patient Stated Goal: to decrease pain  PT Goal Formulation: With patient Time For Goal Achievement: 04/05/19 Potential to Achieve Goals: Good Progress towards PT goals: Progressing toward goals    Frequency    7X/week      PT Plan Current plan remains appropriate    Co-evaluation              AM-PAC PT "6 Clicks" Mobility   Outcome Measure  Help needed turning from your back to your side while in a flat bed without using bedrails?: A Little Help needed moving from lying on your back to sitting on the side of a flat bed without using bedrails?: A Little Help needed moving to and from a bed to a chair (including a wheelchair)?: A Little Help needed standing up from a chair using your arms (e.g., wheelchair or bedside chair)?: A Little Help needed to walk in hospital room?: A Little Help needed climbing 3-5 steps with a railing? : A Lot 6 Click Score: 17    End of Session Equipment Utilized During Treatment: Gait belt Activity Tolerance: Patient limited by pain Patient left: in bed;with call bell/phone within reach Nurse Communication: Mobility status PT Visit Diagnosis: Difficulty in walking, not elsewhere classified (R26.2);Muscle weakness (generalized) (M62.81);Pain Pain - Right/Left: Right Pain - part of body: Knee     Time: 0915-0956 PT Time Calculation (min) (ACUTE ONLY): 41 min  Charges:  $Gait Training: 23-37 mins $Therapeutic Exercise: 8-22 mins                    [name] E, PT Acute Rehabilitation Services Pager [phone]  Office [phone]  [name] 03/24/2019, 10:29 AM

## 2019-03-24 NOTE — Plan of Care (Signed)
  Problem: Education: Goal: Knowledge of General Education information will improve Description: Including pain rating scale, medication(s)/side effects and non-pharmacologic comfort measures Outcome: Progressing   Problem: Health Behavior/Discharge Planning: Goal: Ability to manage health-related needs will improve Outcome: Progressing   Problem: Clinical Measurements: Goal: Ability to maintain clinical measurements within normal limits will improve Outcome: Progressing Goal: Diagnostic test results will improve Outcome: Progressing   Problem: Activity: Goal: Risk for activity intolerance will decrease Outcome: Progressing   Problem: Nutrition: Goal: Adequate nutrition will be maintained Outcome: Progressing   Problem: Elimination: Goal: Will not experience complications related to bowel motility Outcome: Progressing   Problem: Pain Managment: Goal: General experience of comfort will improve Outcome: Progressing   Problem: Safety: Goal: Ability to remain free from injury will improve Outcome: Progressing

## 2019-03-25 DIAGNOSIS — E440 Moderate protein-calorie malnutrition: Secondary | ICD-10-CM | POA: Insufficient documentation

## 2019-03-25 LAB — GLUCOSE, CAPILLARY: Glucose-Capillary: 164 mg/dL — ABNORMAL HIGH (ref 70–99)

## 2019-03-25 NOTE — Progress Notes (Signed)
Physical Therapy Treatment Patient Details Name: [name] MRN: [id number] DOB: 04-03-1965 Today's Date: 03/25/2019    History of Present Illness Pt is a 54 y/o male s/p R total knee revision. PMH includes DM, chronic pain, CVA, polysubstance abuse, s/p R transmet amputation, s/p R THA, s/p R TKA, and s/p L TKA.     PT Comments    Patient received standing at sink-side with required UE support for balance. Patient agreeable to PT session. Requires general min guard to supervision level assist in session for balance precautions, however no overt loss of balance. Patient reporting general fatigue limiting further mobility this session. Making good progress - will continue to follow acutely - likely to be discharged today with patient appropriate to discharge home from a mobility standpoint.     Follow Up Recommendations  Follow surgeon's recommendation for DC plan and follow-up therapies;Supervision for mobility/OOB     Equipment Recommendations  None recommended by PT    Recommendations for Other Services       Precautions / Restrictions Precautions Precautions: Knee Precaution Booklet Issued: No Precaution Comments: Pt verbalizes knee precautions Restrictions Weight Bearing Restrictions: Yes RLE Weight Bearing: Weight bearing as tolerated    Mobility  Bed Mobility Overal bed mobility: Needs Assistance Bed Mobility: Sit to Supine       Sit to supine: HOB elevated;Supervision      Transfers Overall transfer level: Needs assistance Equipment used: Rolling walker (2 wheeled) Transfers: Sit to/from Stand Sit to Stand: Min guard;From elevated surface         General transfer comment: min guard for safety and initial standing balance  Ambulation/Gait Ambulation/Gait assistance: Min guard;Supervision Gait Distance (Feet): 150 Feet Assistive device: Rolling walker (2 wheeled) Gait Pattern/deviations: Decreased weight shift to right;Step-through pattern;Decreased  stride length;Antalgic Gait velocity: decr   General Gait Details: cueing for obstavcle navigation and general safety   Stairs             Wheelchair Mobility    Modified Rankin (Stroke Patients Only)       Balance Overall balance assessment: Needs assistance Sitting-balance support: No upper extremity supported;Feet supported Sitting balance-Leahy Scale: Good Sitting balance - Comments: able to sit EOB without PT assist   Standing balance support: Bilateral upper extremity supported;During functional activity Standing balance-Leahy Scale: Poor Standing balance comment: Reliant on BUE support                             Cognition Arousal/Alertness: Awake/alert Behavior During Therapy: WFL for tasks assessed/performed Overall Cognitive Status: Within Functional Limits for tasks assessed                                        Exercises      General Comments        Pertinent Vitals/Pain Pain Assessment: 0-10 Pain Score: 2  Pain Location: R knee Pain Descriptors / Indicators: Aching;Sore Pain Intervention(s): Limited activity within patient's tolerance;Monitored during session;Repositioned    Home Living                      Prior Function            PT Goals (current goals can now be found in the care plan section) Acute Rehab PT Goals Patient Stated Goal: to decrease pain  PT Goal Formulation: With patient Time  For Goal Achievement: 04/05/19 Potential to Achieve Goals: Good Progress towards PT goals: Progressing toward goals    Frequency    7X/week      PT Plan Current plan remains appropriate    Co-evaluation              AM-PAC PT "6 Clicks" Mobility   Outcome Measure  Help needed turning from your back to your side while in a flat bed without using bedrails?: A Little Help needed moving from lying on your back to sitting on the side of a flat bed without using bedrails?: A Little Help needed  moving to and from a bed to a chair (including a wheelchair)?: A Little Help needed standing up from a chair using your arms (e.g., wheelchair or bedside chair)?: A Little Help needed to walk in hospital room?: A Little Help needed climbing 3-5 steps with a railing? : A Little 6 Click Score: 18    End of Session Equipment Utilized During Treatment: Gait belt Activity Tolerance: Patient limited by fatigue Patient left: in bed;with call bell/phone within reach Nurse Communication: Mobility status PT Visit Diagnosis: Difficulty in walking, not elsewhere classified (R26.2);Muscle weakness (generalized) (M62.81);Pain Pain - Right/Left: Right Pain - part of body: Knee     Time: [id number] PT Time Calculation (min) (ACUTE ONLY): 23 min  Charges:  $Gait Training: 8-22 mins $Therapeutic Activity: 8-22 mins                     Lanney Gins, PT, DPT Supplemental Physical Therapist 03/25/19 11:58 AM Pager: [phone] Office: [phone]

## 2019-03-25 NOTE — Progress Notes (Signed)
Pt dressed and taken downstairs to family car. Pt disconnected self drom preveena once more and when RN tried to reconnect him, pt said "just put it in the bag I will connect it when I get home" this RN stressed the importance of keeping the vac on at all times even when at home and reconnected the pt. Pt was d/c'ed with vac still connected.

## 2019-03-25 NOTE — Progress Notes (Signed)
Pt continues to unplug himself from wound vac  Even after education from [employer]. Pt continues to get up in room regardless of bed alarm and education from RN. Will continue to promote safety and re-connect pt to wound vac.

## 2019-03-25 NOTE — Plan of Care (Signed)

## 2019-03-25 NOTE — Progress Notes (Signed)
RN reviewed AVS with pt, all questions answered to satisfaction. Pt iv removed, preevena attached and belongings gathered. Pt dressed and ride has been called; awaiting their arrival. Will continue to monitor.

## 2019-03-25 NOTE — Discharge Summary (Signed)
Discharge Diagnoses:  Active Problems:   Failed total right knee replacement (HCC)   Malnutrition of moderate degree   Surgeries: Procedure(s): RIGHT TOTAL KNEE ARTHROPLASTY REVISION OF ALL COMPONENTS on 03/22/2019    Consultants:   Discharged Condition: Improved  Hospital Course: [name] is an 54 y.o. male who was admitted 03/22/2019 with a chief complaint of previously infected total knee arthroplasty with revision to an anatomic antibiotic total knee., with a final diagnosis of Antibiotic Spacer Right Knee.  Patient was brought to the operating room on 03/22/2019 and underwent Procedure(s): RIGHT TOTAL KNEE ARTHROPLASTY REVISION OF ALL COMPONENTS.    Patient was given perioperative antibiotics:  Anti-infectives (From admission, onward)   Start     Dose/Rate Route Frequency Ordered Stop   03/22/19 1645  ceFAZolin (ANCEF) IVPB 1 g/50 mL premix     1 g 100 mL/hr over 30 Minutes Intravenous Every 6 hours 03/22/19 1631 03/23/19 0100   03/22/19 0700  vancomycin (VANCOCIN) IVPB 1000 mg/200 mL premix     1,000 mg 200 mL/hr over 60 Minutes Intravenous  Once 03/22/19 0646 03/22/19 0900   03/22/19 0700  ceFAZolin (ANCEF) IVPB 2g/100 mL premix     2 g 200 mL/hr over 30 Minutes Intravenous On call to O.R. 03/22/19 [id number] 03/22/19 [id number]    .  Patient was given sequential compression devices, early ambulation, and aspirin for DVT prophylaxis.  Recent vital signs:  Patient Vitals for the past 24 hrs:  BP Temp Temp src Pulse Resp SpO2  03/25/19 0800 106/71 98.1 F (36.7 C) Oral 88 18 100 %  03/25/19 0456 129/67 98.1 F (36.7 C) Oral (!) 102 16 97 %  03/24/19 2017 130/70 97.7 F (36.5 C) Oral 95 18 100 %  .  Recent laboratory studies: No results found.  Discharge Medications:   Allergies as of 03/25/2019      Reactions   Benadryl [diphenhydramine Hcl] Other (See Comments)   Leg spasms    Trazodone And Nefazodone Other (See Comments)   Leg spasms       Medication List    STOP  taking these medications   amLODipine 5 MG tablet Commonly known as: NORVASC   nicotine 21 mg/24hr patch Commonly known as: NICODERM CQ - dosed in mg/24 hours   oxyCODONE-acetaminophen 5-325 MG tablet Commonly known as: PERCOCET/ROXICET   pentoxifylline 400 MG CR tablet Commonly known as: TRENTAL   senna-docusate 8.6-50 MG tablet Commonly known as: Senokot-S   vancomycin  IVPB     TAKE these medications   acetaminophen 325 MG tablet Commonly known as: TYLENOL Take 1-2 tablets (325-650 mg total) by mouth every 6 (six) hours as needed for mild pain (pain score 1-3 or temp > 100.5).   aspirin 325 MG EC tablet Take 1 tablet (325 mg total) by mouth daily with breakfast.   carvedilol 25 MG tablet Commonly known as: COREG Take 1 tablet (25 mg total) by mouth 2 (two) times daily with a meal.   feeding supplement (GLUCERNA SHAKE) Liqd Take 237 mLs by mouth 3 (three) times daily between meals.   gabapentin 300 MG capsule Commonly known as: NEURONTIN Take 1 capsule (300 mg total) by mouth 3 (three) times daily.   ibuprofen 200 MG tablet Commonly known as: ADVIL Take 200 mg by mouth every 6 (six) hours as needed.   metFORMIN 500 MG tablet Commonly known as: Glucophage Take 2 tablets (1,000 mg total) by mouth 2 (two) times daily with a meal. What changed: how much  to take   nitroGLYCERIN 0.2 mg/hr patch Commonly known as: NITRODUR - Dosed in mg/24 hr Place 1 patch (0.2 mg total) onto the skin daily.   Oxycodone HCl 10 MG Tabs Take 1-1.5 tablets (10-15 mg total) by mouth every 4 (four) hours as needed for severe pain (pain score 7-10). What changed:   how much to take  when to take this  reasons to take this   polyethylene glycol 17 g packet Commonly known as: MIRALAX / GLYCOLAX Take 17 g by mouth daily as needed for mild constipation.   rosuvastatin 20 MG tablet Commonly known as: CRESTOR Take 1 tablet (20 mg total) by mouth daily.   silver sulfADIAZINE 1 %  cream Commonly known as: SILVADENE Apply 1 application topically daily.       Diagnostic Studies: XR Knee 1-2 Views Right  Result Date: 02/23/2019 2 view radiographs of the right knee shows stable anatomic antibiotic spacer no subsidence or fractures.   Patient benefited maximally from their hospital stay and there were no complications.     Disposition: Discharge disposition: 01-Home or Self Care      Discharge Instructions    Call MD / Call 911   Complete by: As directed    If you experience chest pain or shortness of breath, CALL 911 and be transported to the hospital emergency room.  If you develope a fever above 101 F, pus (white drainage) or increased drainage or redness at the wound, or calf pain, call your surgeon's office.   Call MD / Call 911   Complete by: As directed    If you experience chest pain or shortness of breath, CALL 911 and be transported to the hospital emergency room.  If you develope a fever above 101 F, pus (white drainage) or increased drainage or redness at the wound, or calf pain, call your surgeon's office.   Constipation Prevention   Complete by: As directed    Drink plenty of fluids.  Prune juice may be helpful.  You may use a stool softener, such as Colace (over the counter) 100 mg twice a day.  Use MiraLax (over the counter) for constipation as needed.   Constipation Prevention   Complete by: As directed    Drink plenty of fluids.  Prune juice may be helpful.  You may use a stool softener, such as Colace (over the counter) 100 mg twice a day.  Use MiraLax (over the counter) for constipation as needed.   Diet - low sodium heart healthy   Complete by: As directed    Diet - low sodium heart healthy   Complete by: As directed    Discharge instructions   Complete by: As directed    Elevate leg. WBAT   Increase activity slowly as tolerated   Complete by: As directed    Increase activity slowly as tolerated   Complete by: As directed    Negative  Pressure Wound Therapy - Incisional   Complete by: As directed    Show Patient how to attach and charge prevena vac     Follow-up Information    [name], MD In 1 week.   Specialty: Orthopedic Surgery Contact information: [address] [place] [address] [phone]            Signed: [name] 03/25/2019, 9:06 AM

## 2019-03-27 ENCOUNTER — Telehealth: Payer: Self-pay | Admitting: Orthopedic Surgery

## 2019-03-27 NOTE — Telephone Encounter (Signed)
[facility] called to get verbal orders resumption on care 2x week for1 1x week for 2

## 2019-03-27 NOTE — Telephone Encounter (Signed)
[facility]/PT & wound care called and stated needs orders for wound care and PT.  [facility] OR CALL [phone] urgent patient is continuously calling.

## 2019-03-27 NOTE — Telephone Encounter (Signed)
I called and sw HHN advised that the wound vac will stay in place until first post op appt on 03/30/19. They will go out to see the pt after that following instructions from visit frequency listed below.

## 2019-03-27 NOTE — Telephone Encounter (Signed)
Called and advised that the pt is s/p a total knee revision and verbal ok for PT with total knee precautions, will resume OT and no need for [facility] care orders at the moment the pt is wearing a wound vac and this will remain in place until his appointment on 03/30/19.

## 2019-03-28 ENCOUNTER — Telehealth: Payer: Self-pay

## 2019-03-28 NOTE — Telephone Encounter (Signed)
Dr. Duda is aware.  

## 2019-03-28 NOTE — Telephone Encounter (Signed)
Kat the OT from [facility] stated pt felt he doesn't need to continue care with her. She agreed the pt was doing well as far as OT is concerned.She did state they would continue the regular therapy for the pt. If you have any question her CB  # [phone]

## 2019-03-30 ENCOUNTER — Ambulatory Visit (INDEPENDENT_AMBULATORY_CARE_PROVIDER_SITE_OTHER): Payer: Medicare Other | Admitting: Orthopedic Surgery

## 2019-03-30 ENCOUNTER — Other Ambulatory Visit: Payer: Self-pay

## 2019-03-30 ENCOUNTER — Encounter: Payer: Self-pay | Admitting: Orthopedic Surgery

## 2019-03-30 VITALS — Ht 75.0 in | Wt 172.0 lb

## 2019-03-30 DIAGNOSIS — Z96651 Presence of right artificial knee joint: Secondary | ICD-10-CM

## 2019-03-30 NOTE — Progress Notes (Signed)
Office Visit Note   Patient: [name]           Date of Birth: Oct 11, 1965           MRN: [id number] Visit Date: 03/30/2019              Requested by: Lucia Gaskins, MD [address] [place],  [place] [address] PCP: Lucia Gaskins, MD  Chief Complaint  Patient presents with  . Right Knee - Routine Post Op    03/22/19 right total knee revision       HPI: Patient is a 54 year old gentleman who presents 1 week status post revision right total knee arthroplasty.  Patient is currently ambulating with a walker working with physical therapy he states he is extremely pleased with his progress and states that the day after surgery he felt better than he did before surgery.  Assessment & Plan: Visit Diagnoses:  1. S/P revision of total knee, right     Plan: Patient will continue with his strengthening exercise and range of motion.  Follow-up in 2 weeks with 2 view radiographs of the right knee.  Recommend that he follow-up with his orthotist to go ahead and proceed with new orthotics.  Follow-Up Instructions: Return in about 2 weeks (around 04/13/2019).   Ortho Exam  Patient is alert, oriented, no adenopathy, well-dressed, normal affect, normal respiratory effort. Patient removed the wound VAC at home the incision is healing nicely there is no redness no cellulitis no drainage no signs of infection.  Patient has active range of motion from 0 to 100 degrees.  Patient has Wagner grade 1 ulcers on the plantar aspect of both feet.  After informed consent the callus was pared from both ulcers the right foot ulcer has good epithelization with no open wound this is 3 cm in diameter 1 mm deep.  The left foot ulcer has an area of 5 mm of healthy granulation tissue the callused area was 4 cm in diameter both wounds have improved from the previous surgical condition.  Imaging: No results found.   Labs: Lab Results  Component Value Date   HGBA1C 6.4 (H) 03/20/2019   HGBA1C 6.4 (H)  10/31/2018   HGBA1C 6.2 (H) 06/13/2018   ESRSEDRATE 20 (H) 01/01/2019   ESRSEDRATE 120 (H) 11/16/2018   ESRSEDRATE 30 (H) 06/13/2018   CRP 1.0 (H) 01/01/2019   CRP 36 (H) 11/16/2018   CRP 5.7 (H) 06/13/2018   REPTSTATUS 11/28/2018 FINAL 11/23/2018   GRAMSTAIN  11/23/2018    FEW WBC PRESENT, PREDOMINANTLY PMN NO ORGANISMS SEEN    CULT  11/23/2018    RARE STAPHYLOCOCCUS AUREUS CRITICAL RESULT CALLED TO, READ BACK BY AND VERIFIED WITH: Deno Lunger RN, AT 1053 11/25/18 BY D. VANHOOK REGARDING CULTURE GROWTH NO ANAEROBES ISOLATED Performed at [facility], [place] [address], [place], [place] [address]    LABORGA STAPHYLOCOCCUS AUREUS 11/23/2018     Lab Results  Component Value Date   ALBUMIN 4.0 03/20/2019   ALBUMIN 2.1 (L) 11/26/2018   ALBUMIN 3.1 (L) 11/16/2018    Lab Results  Component Value Date   MG 1.8 11/28/2018   MG 1.7 11/27/2018   MG 2.0 11/26/2018   No results found for: VD25OH  No results found for: PREALBUMIN CBC EXTENDED Latest Ref Rng & Units 03/22/2019 03/20/2019 01/01/2019  WBC 4.0 - 10.5 K/uL - 11.1(H) 7.1  RBC 4.22 - 5.81 MIL/uL - 5.48 4.02(L)  HGB 13.0 - 17.0 g/dL 9.3(L) 14.3 10.5(L)  HCT  39.0 - 52.0 % 30.0(L) 45.9 34.6(L)  PLT 150 - 400 K/uL - 279 242  NEUTROABS 1.7 - 7.7 K/uL - - 4.9  LYMPHSABS 0.7 - 4.0 K/uL - - 1.5     Body mass index is 21.5 kg/m.  Orders:  No orders of the defined types were placed in this encounter.  No orders of the defined types were placed in this encounter.    Procedures: No procedures performed  Clinical Data: No additional findings.  ROS:  All other systems negative, except as noted in the HPI. Review of Systems  Objective: Vital Signs: Ht 6\' 3"  (1.905 m)   Wt 172 lb (78 kg)   BMI 21.50 kg/m   Specialty Comments:  No specialty comments available.  PMFS History: Patient Active Problem List   Diagnosis Date Noted  . Malnutrition of moderate degree 03/25/2019  . Failed total right knee  replacement ([place]) 03/22/2019  . PICC (peripherally inserted central catheter) in place 12/21/2018  . Medication monitoring encounter 12/21/2018  . Staphylococcus aureus bacteremia   . 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 Infected prosthetic knee joint, initial encounter ([place])   . Septic joint of right knee joint ([place]) 11/16/2018  . Hypokalemia 11/16/2018  . Anemia 11/16/2018  . Acute encephalopathy 10/31/2018  . Accidental drug overdose 10/31/2018  . Left arm weakness 10/31/2018  . History of transmetatarsal amputation of right foot ([place]) 08/30/2018  . Charcot foot due to diabetes mellitus ([place])   . Cellulitis 06/12/2018  . Diabetic foot infection ([place]) 06/22/2017  . Post op infection 06/22/2017  . SVT (supraventricular tachycardia) ([place]) 08/17/2016  . Tobacco use disorder 10/25/2014  . Diabetes mellitus type 2 in nonobese (HCC)   . Swelling   . Type 2 diabetes mellitus with left diabetic foot ulcer ([place]) 12/18/2013  . Diabetes mellitus due to underlying condition with foot ulcer ([place]) 12/18/2013  . Left leg cellulitis 12/18/2013  . Cocaine abuse ([place]) 03/29/2012  . Generalized anxiety disorder 03/29/2012  . Panic attacks 03/29/2012  . Alcohol dependence ([place]) 08/05/2011    Class: Chronic  . Substance abuse ([place]) 05/31/2011  . Ulcer of left foot, limited to breakdown of skin ([place]) 02/15/2011   Past Medical History:  Diagnosis Date  . Anemia    as a child  . Anxiety   . Arthritis    "everywhere" (04/21/2012)  . Cellulitis and abscess of foot 12/19/2013   LEFT FOOT  . Cellulitis of right foot   . Chronic pain   . Constipation   . DDD (degenerative disc disease)   . Depression    pt denies  . Diabetes mellitus without complication (HCC)    borderline  . Diabetic foot ulcer ([place])   . ETOH abuse   . Full dentures   . GERD (gastroesophageal reflux disease)    tums  . H/O amputation of lesser toe, right ([place]) 07/29/2017  . History of blood transfusion     "related to left knee OR; probably right hip too" (04/21/2012)  . History of kidney stones   . Mental disorder   . MRSA (methicillin resistant staph aureus) culture positive   . Neuromuscular disorder (HCC)    neuropathy  . Noncompliance   . Open wound    bottom of foot  . Osteomyelitis ([place])    right 4th toe  . Osteomyelitis of right foot ([place])   . Pneumonia ~ 2012  . Polysubstance abuse (HCC)    etoh, cocaine, heroin  . Stroke (  [place]) 2008   "they said I might have had one during right hip replacement" (04/21/2012)  . Wears glasses     Family History  Problem Relation Age of Onset  . Diabetes Father     Past Surgical History:  Procedure Laterality Date  . AMPUTATION Right 06/09/2017   Procedure: RIGHT 2ND TOE AMPUTATION;  Surgeon: [name], MD;  Location: [facility];  Service: Orthopedics;  Laterality: Right;  . AMPUTATION Right 07/23/2017   Procedure: RIGHT 4TH TOE AMPUTATION;  Surgeon: [name], MD;  Location: [facility];  Service: Orthopedics;  Laterality: Right;  . AMPUTATION Right 06/15/2018   Procedure: RIGHT FOOT TRANSMETATARSAL AMPUTATION;  Surgeon: [name], MD;  Location: [facility];  Service: Orthopedics;  Laterality: Right;  right  . EXCISIONAL TOTAL KNEE ARTHROPLASTY WITH ANTIBIOTIC SPACERS Right 11/23/2018   Procedure: REMOVAL OF RIGHT TOTAL KNEE ARTHROPLASTY AND PLACEMENT OF ANATOMIC ANTIBIOTIC SPACER;  Surgeon: [name], MD;  Location: [facility];  Service: Orthopedics;  Laterality: Right;  . I & D EXTREMITY Right 11/18/2018   Procedure: RIGHT KNEE DEBRIDEMENT;  Surgeon: [name], MD;  Location: [facility];  Service: Orthopedics;  Laterality: Right;  . JOINT REPLACEMENT    . KNEE ARTHROSCOPY Bilateral 1980's/1990's  . LUNG LOBECTOMY Left ~ 2006  . LUNG LOBECTOMY    . METATARSAL OSTEOTOMY  10/29/2011   Procedure: METATARSAL OSTEOTOMY;  Surgeon: [name], MD;  Location: [facility];  Service: Orthopedics;  Laterality: Left;  Left 1st Metatarsal Dorsal Closing Wedge     . METATARSAL OSTEOTOMY Right 04/28/2017   Procedure: RIGHT 1ST METATARSAL DORSAL CLOSING WEDGE OSTEOTOMY;  Surgeon: [name], MD;  Location: [facility];  Service: Orthopedics;  Laterality: Right;  . MULTIPLE TOOTH EXTRACTIONS    . REVISION TOTAL HIP ARTHROPLASTY Right 2008   "4-5 months after replacement" (04/21/2012)  . TOTAL HIP ARTHROPLASTY Right 2008  . TOTAL KNEE ARTHROPLASTY Left 2006  . TOTAL KNEE ARTHROPLASTY Right 04/20/2012  . TOTAL KNEE ARTHROPLASTY Right 04/20/2012   Procedure: TOTAL KNEE ARTHROPLASTY;  Surgeon: [name], MD;  Location: [facility];  Service: Orthopedics;  Laterality: Right;  Right Total Knee Arthroplasty  . TOTAL KNEE REVISION Right 03/22/2019   Procedure: RIGHT TOTAL KNEE ARTHROPLASTY REVISION OF ALL COMPONENTS;  Surgeon: [name], MD;  Location: [facility];  Service: Orthopedics;  Laterality: Right;   Social History   Occupational History  . Not on file  Tobacco Use  . Smoking status: Current Every Day Smoker    Packs/day: 0.50    Years: 30.00    Pack years: 15.00    Types: Cigarettes  . Smokeless tobacco: Never Used  . Tobacco comment: thinking about quitting, talking with PCP  Substance and Sexual Activity  . Alcohol use: Not Currently    Alcohol/week: 0.0 standard drinks  . Drug use: Not Currently    Types: Cocaine    Comment: Cocaine and heroin - 10 years ago  . Sexual activity: Yes    Birth control/protection: None

## 2019-04-03 ENCOUNTER — Telehealth: Payer: Self-pay | Admitting: Orthopedic Surgery

## 2019-04-03 NOTE — Telephone Encounter (Signed)
Received call from [facility] with [facility] needing new orders for patient's right knee. The number to contact Janett Billow is [phone]

## 2019-04-03 NOTE — Telephone Encounter (Signed)
I called and sw HHN to advise that the pt's total knee revision looked well and that he had been advised to keep the area clean and covered no specific nursing instructions. Will monitor and call with questions.

## 2019-04-13 ENCOUNTER — Ambulatory Visit: Payer: Medicare Other | Admitting: Physician Assistant

## 2019-04-18 ENCOUNTER — Ambulatory Visit (INDEPENDENT_AMBULATORY_CARE_PROVIDER_SITE_OTHER): Payer: Medicare Other | Admitting: Physician Assistant

## 2019-04-18 ENCOUNTER — Other Ambulatory Visit: Payer: Self-pay

## 2019-04-18 ENCOUNTER — Encounter: Payer: Self-pay | Admitting: Physician Assistant

## 2019-04-18 ENCOUNTER — Ambulatory Visit (INDEPENDENT_AMBULATORY_CARE_PROVIDER_SITE_OTHER): Payer: Medicare Other

## 2019-04-18 VITALS — Ht 75.0 in | Wt 172.0 lb

## 2019-04-18 DIAGNOSIS — Z96651 Presence of right artificial knee joint: Secondary | ICD-10-CM

## 2019-04-18 NOTE — Progress Notes (Signed)
Office Visit Note   Patient: [name]           Date of Birth: 08-Mar-1965           MRN: [id number] Visit Date: 04/18/2019              Requested by: Lucia Gaskins, MD [address] [place],  [place] [address] PCP: Lucia Gaskins, MD  Chief Complaint  Patient presents with  . Right Knee - Routine Post Op    03/22/19 right total knee replacement       HPI: The patient is a 54 year old gentleman seen today postoperatively.  He is status post right total knee arthroplasty revision.  This was on March 3.  He feels well has no concerns he has been discharged from physical therapy he is very pleased with his range of motion.  He is also seen in follow-up for bilateral foot ulcerations.  These are chronic and have been ongoing for quite some.  They have fortunately improved as he has been a little bit laid up from the knee.  Assessment & Plan: Visit Diagnoses:  1. S/P revision of total knee, right     Plan: Pleased with his progression in therapy and healing of the right knee.  We will continue with current wound care for his feet.  He will follow-up in the office in 4 weeks.  Follow-Up Instructions: No follow-ups on file.   Ortho Exam  Patient is alert, oriented, no adenopathy, well-dressed, normal affect, normal respiratory effort. Examination of the right knee has full extension there is no redness no cellulitis incision is well-healed there is no drainage no gaping.  To the right transmetatarsal amputation he has a plantar ulceration that is 2 cm in diameter.  There is surrounding nonviable callus tissue this was debrided with a 10 blade knife back to viable tissue.  There is central ulceration that is 4 mm deep there is no active drainage no odor no surrounding cellulitis.  Examination of the left foot he has a cavovarus foot with a Wagner grade 1 ulcer beneath his first metatarsal head this is 3 cm in diameter it was debrided with a 10 blade knife of the surrounding  nonviable tissue.  There is 1 mm of depth with granulation in the center.  There is no exposed bone no tendon no sign of infection  Imaging: XR Knee 1-2 Views Right  Result Date: 04/18/2019 Radiographs of right knee show stable alignment of total knee arthroplasty hardware. No complicating features.   No images are attached to the encounter.  Labs: Lab Results  Component Value Date   HGBA1C 6.4 (H) 03/20/2019   HGBA1C 6.4 (H) 10/31/2018   HGBA1C 6.2 (H) 06/13/2018   ESRSEDRATE 20 (H) 01/01/2019   ESRSEDRATE 120 (H) 11/16/2018   ESRSEDRATE 30 (H) 06/13/2018   CRP 1.0 (H) 01/01/2019   CRP 36 (H) 11/16/2018   CRP 5.7 (H) 06/13/2018   REPTSTATUS 11/28/2018 FINAL 11/23/2018   GRAMSTAIN  11/23/2018    FEW WBC PRESENT, PREDOMINANTLY PMN NO ORGANISMS SEEN    CULT  11/23/2018    RARE STAPHYLOCOCCUS AUREUS CRITICAL RESULT CALLED TO, READ BACK BY AND VERIFIED WITH: Deno Lunger RN, AT 1053 11/25/18 BY D. VANHOOK REGARDING CULTURE GROWTH NO ANAEROBES ISOLATED Performed at [facility], [place] [address], [place], [place] [address]    Frannie 11/23/2018     Lab Results  Component Value Date   ALBUMIN 4.0 03/20/2019  ALBUMIN 2.1 (L) 11/26/2018   ALBUMIN 3.1 (L) 11/16/2018    Lab Results  Component Value Date   MG 1.8 11/28/2018   MG 1.7 11/27/2018   MG 2.0 11/26/2018   No results found for: VD25OH  No results found for: PREALBUMIN CBC EXTENDED Latest Ref Rng & Units 03/22/2019 03/20/2019 01/01/2019  WBC 4.0 - 10.5 K/uL - 11.1(H) 7.1  RBC 4.22 - 5.81 MIL/uL - 5.48 4.02(L)  HGB 13.0 - 17.0 g/dL 9.3(L) 14.3 10.5(L)  HCT 39.0 - 52.0 % 30.0(L) 45.9 34.6(L)  PLT 150 - 400 K/uL - 279 242  NEUTROABS 1.7 - 7.7 K/uL - - 4.9  LYMPHSABS 0.7 - 4.0 K/uL - - 1.5     Body mass index is 21.5 kg/m.  Orders:  Orders Placed This Encounter  Procedures  . XR Knee 1-2 Views Right   No orders of the defined types were placed in this encounter.     Procedures: No procedures performed  Clinical Data: No additional findings.  ROS:  All other systems negative, except as noted in the HPI. Review of Systems  Constitutional: Negative for chills and fever.  Musculoskeletal: Negative for arthralgias and myalgias.  Skin: Positive for wound. Negative for color change.    Objective: Vital Signs: Ht 6\' 3"  (1.905 m)   Wt 172 lb (78 kg)   BMI 21.50 kg/m   Specialty Comments:  No specialty comments available.  PMFS History: Patient Active Problem List   Diagnosis Date Noted  . Malnutrition of moderate degree 03/25/2019  . 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 PICC (peripherally inserted central catheter) in place 12/21/2018  . Medication monitoring encounter 12/21/2018  . Staphylococcus aureus bacteremia   . Infection of total knee replacement ([place])   . Infected prosthetic knee joint, initial encounter ([place])   . Septic joint of right knee joint ([place]) 11/16/2018  . Hypokalemia 11/16/2018  . Anemia 11/16/2018  . Acute encephalopathy 10/31/2018  . Accidental drug overdose 10/31/2018  . Left arm weakness 10/31/2018  . History of transmetatarsal amputation of right foot ([place]) 08/30/2018  . Charcot foot due to diabetes mellitus ([place])   . Cellulitis 06/12/2018  . Diabetic foot infection ([place]) 06/22/2017  . Post op infection 06/22/2017  . SVT (supraventricular tachycardia) ([place]) 08/17/2016  . Tobacco use disorder 10/25/2014  . Diabetes mellitus type 2 in nonobese (HCC)   . Swelling   . Type 2 diabetes mellitus with left diabetic foot ulcer ([place]) 12/18/2013  . Diabetes mellitus due to underlying condition with foot ulcer ([place]) 12/18/2013  . Left leg cellulitis 12/18/2013  . Cocaine abuse ([place]) 03/29/2012  . Generalized anxiety disorder 03/29/2012  . Panic attacks 03/29/2012  . Alcohol dependence ([place]) 08/05/2011    Class: Chronic  . Substance abuse ([place]) 05/31/2011  . Ulcer of left foot, limited to breakdown  of skin ([place]) 02/15/2011   Past Medical History:  Diagnosis Date  . Anemia    as a child  . Anxiety   . Arthritis    "everywhere" (04/21/2012)  . Cellulitis and abscess of foot 12/19/2013   LEFT FOOT  . Cellulitis of right foot   . Chronic pain   . Constipation   . DDD (degenerative disc disease)   . Depression    pt denies  . Diabetes mellitus without complication (HCC)    borderline  . Diabetic foot ulcer ([place])   . ETOH abuse   . Full dentures   . GERD (gastroesophageal reflux disease)  tums  . H/O amputation of lesser toe, right ([place]) 07/29/2017  . History of blood transfusion    "related to left knee OR; probably right hip too" (04/21/2012)  . History of kidney stones   . Mental disorder   . MRSA (methicillin resistant staph aureus) culture positive   . Neuromuscular disorder (HCC)    neuropathy  . Noncompliance   . Open wound    bottom of foot  . Osteomyelitis ([place])    right 4th toe  . Osteomyelitis of right foot ([place])   . Pneumonia ~ 2012  . Polysubstance abuse (HCC)    etoh, cocaine, heroin  . Stroke [facility]) 2008   "they said I might have had one during right hip replacement" (04/21/2012)  . Wears glasses     Family History  Problem Relation Age of Onset  . Diabetes Father     Past Surgical History:  Procedure Laterality Date  . AMPUTATION Right 06/09/2017   Procedure: RIGHT 2ND TOE AMPUTATION;  Surgeon: [name], MD;  Location: [facility];  Service: Orthopedics;  Laterality: Right;  . AMPUTATION Right 07/23/2017   Procedure: RIGHT 4TH TOE AMPUTATION;  Surgeon: [name], MD;  Location: [facility];  Service: Orthopedics;  Laterality: Right;  . AMPUTATION Right 06/15/2018   Procedure: RIGHT FOOT TRANSMETATARSAL AMPUTATION;  Surgeon: [name], MD;  Location: [facility];  Service: Orthopedics;  Laterality: Right;  right  . EXCISIONAL TOTAL KNEE ARTHROPLASTY WITH ANTIBIOTIC SPACERS Right 11/23/2018   Procedure: REMOVAL OF RIGHT TOTAL KNEE ARTHROPLASTY AND PLACEMENT  OF ANATOMIC ANTIBIOTIC SPACER;  Surgeon: [name], MD;  Location: [facility];  Service: Orthopedics;  Laterality: Right;  . I & D EXTREMITY Right 11/18/2018   Procedure: RIGHT KNEE DEBRIDEMENT;  Surgeon: [name], MD;  Location: [facility];  Service: Orthopedics;  Laterality: Right;  . JOINT REPLACEMENT    . KNEE ARTHROSCOPY Bilateral 1980's/1990's  . LUNG LOBECTOMY Left ~ 2006  . LUNG LOBECTOMY    . METATARSAL OSTEOTOMY  10/29/2011   Procedure: METATARSAL OSTEOTOMY;  Surgeon: [name], MD;  Location: [facility];  Service: Orthopedics;  Laterality: Left;  Left 1st Metatarsal Dorsal Closing Wedge   . METATARSAL OSTEOTOMY Right 04/28/2017   Procedure: RIGHT 1ST METATARSAL DORSAL CLOSING WEDGE OSTEOTOMY;  Surgeon: [name], MD;  Location: [facility];  Service: Orthopedics;  Laterality: Right;  . MULTIPLE TOOTH EXTRACTIONS    . REVISION TOTAL HIP ARTHROPLASTY Right 2008   "4-5 months after replacement" (04/21/2012)  . TOTAL HIP ARTHROPLASTY Right 2008  . TOTAL KNEE ARTHROPLASTY Left 2006  . TOTAL KNEE ARTHROPLASTY Right 04/20/2012  . TOTAL KNEE ARTHROPLASTY Right 04/20/2012   Procedure: TOTAL KNEE ARTHROPLASTY;  Surgeon: [name], MD;  Location: [facility];  Service: Orthopedics;  Laterality: Right;  Right Total Knee Arthroplasty  . TOTAL KNEE REVISION Right 03/22/2019   Procedure: RIGHT TOTAL KNEE ARTHROPLASTY REVISION OF ALL COMPONENTS;  Surgeon: [name], MD;  Location: [facility];  Service: Orthopedics;  Laterality: Right;   Social History   Occupational History  . Not on file  Tobacco Use  . Smoking status: Current Every Day Smoker    Packs/day: 0.50    Years: 30.00    Pack years: 15.00    Types: Cigarettes  . Smokeless tobacco: Never Used  . Tobacco comment: thinking about quitting, talking with PCP  Substance and Sexual Activity  . Alcohol use: Not Currently    Alcohol/week: 0.0 standard drinks  .  Drug use: Not Currently    Types: Cocaine    Comment: Cocaine and heroin - 10 years  ago  . Sexual activity: Yes    Birth control/protection: None

## 2019-05-01 IMAGING — DX DG CHEST 1V
2 series · 2 of 2 positions shown · non-contrast
Comparison: 02/15/2015

CLINICAL DATA: Syncopal episode.

EXAM:
CHEST 1 VIEW

[chest pa (1 of 2)]
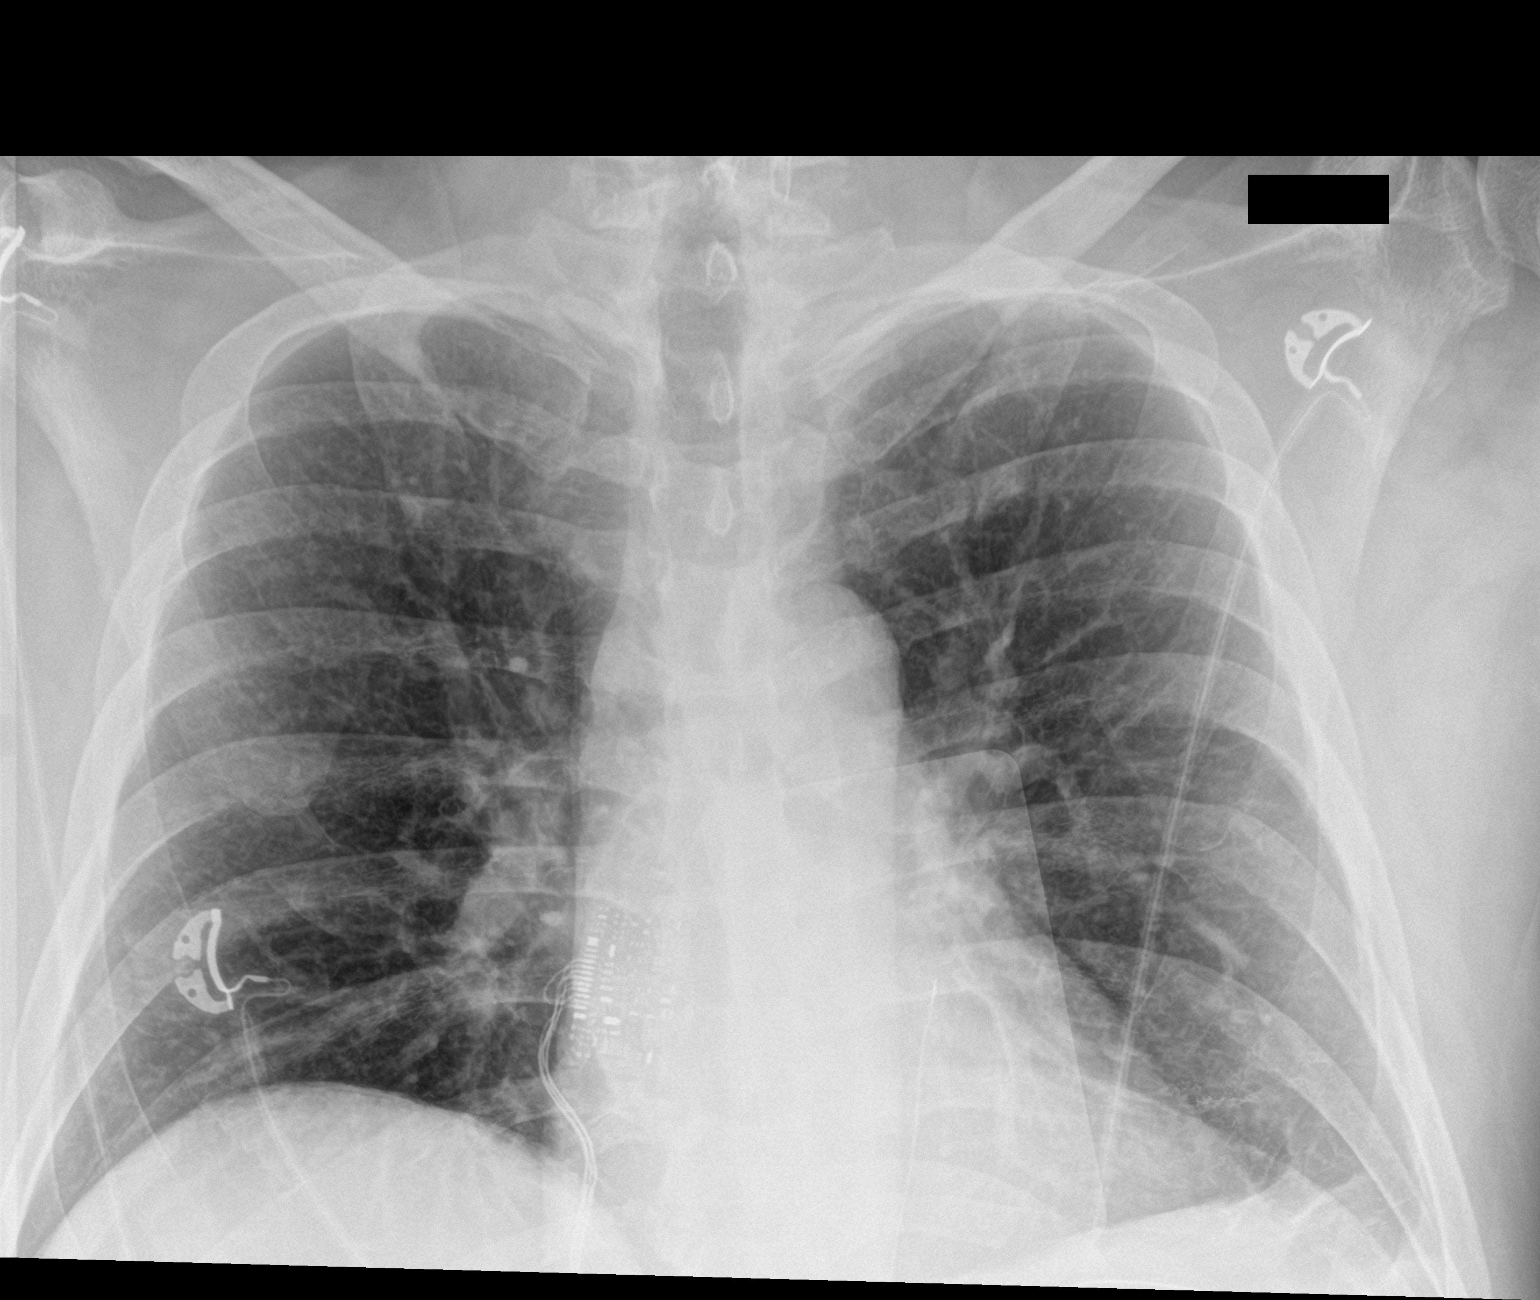

[chest pa (2 of 2)]
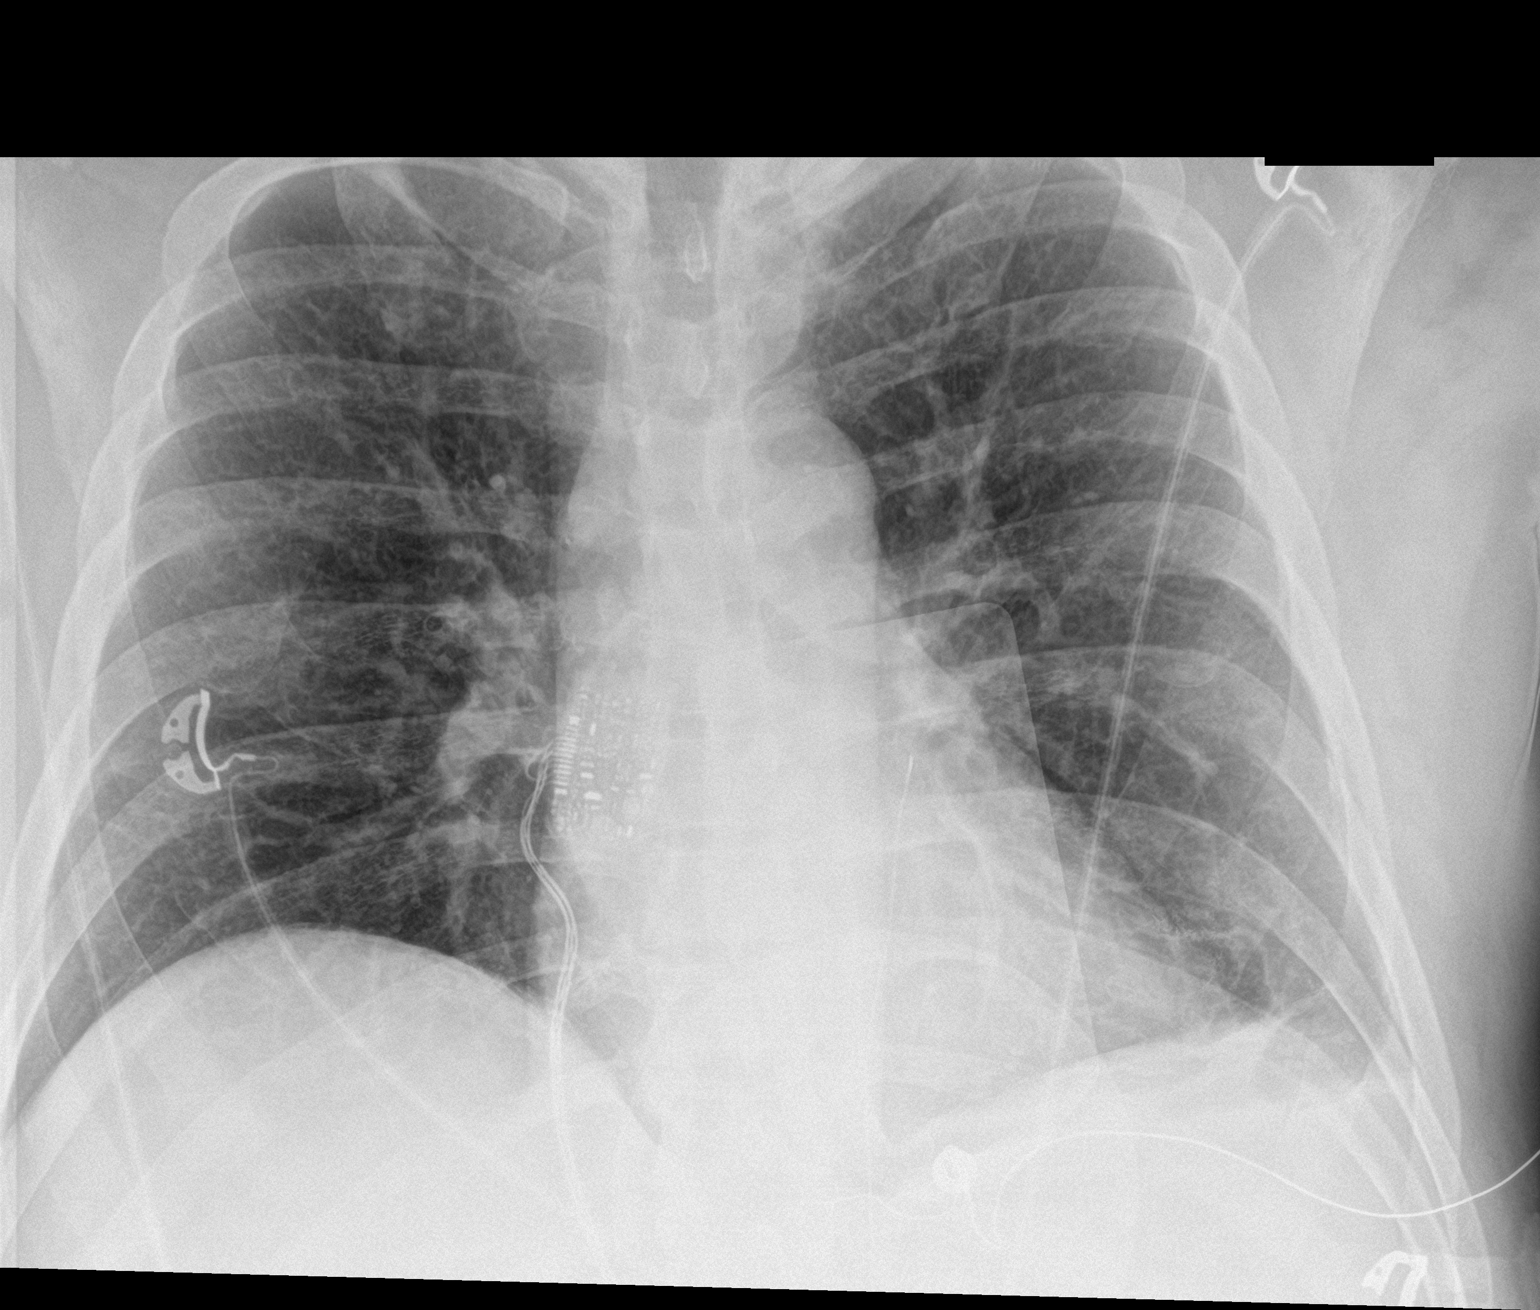

[2 of 2 positions shown; findings below may reference images not displayed]

FINDINGS: Electronic apparatus and shock pads overlie the thorax.

Cardiomediastinal silhouette is normal. Mediastinal contours appear
intact.

There is no evidence of focal airspace consolidation, pleural
effusion or pneumothorax. Low lung volumes. Chronic lingular
scarring.

Osseous structures are without acute abnormality. Soft tissues are
grossly normal.
IMPRESSION: No active disease.

## 2019-05-01 IMAGING — CT CT HEAD W/O CM
3 series · 16 of 47 positions shown, 19 images · non-contrast
Comparison: 11/03/2015

CLINICAL DATA: Status post fall.  Dizziness, nausea palpitations.

EXAM:
CT HEAD WITHOUT CONTRAST
TECHNIQUE: Contiguous axial images were obtained from the base of the skull
through the vertex without intravenous contrast.

[Series 2: head trauma wo · axial · 0.49mm/px · z∈[+108,+253]mm · 10 of 35 slices shown, 13 images]
[im 3/35  brain]
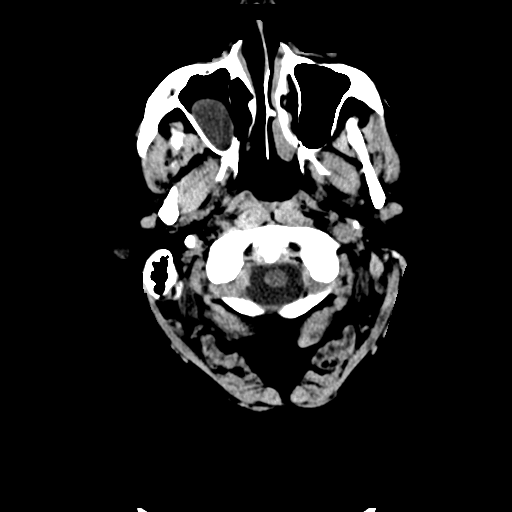
[im 3/35  bone]
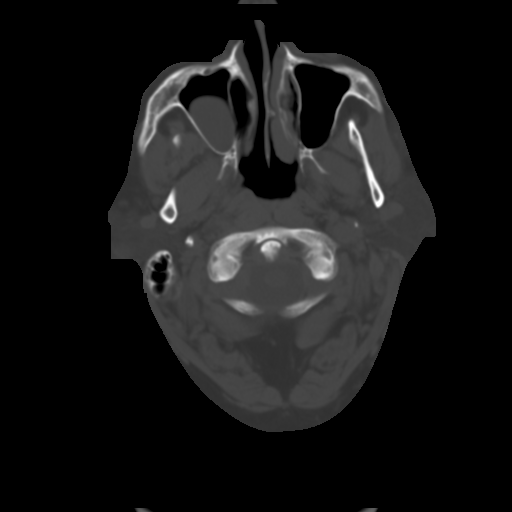
[im 6/35  brain]
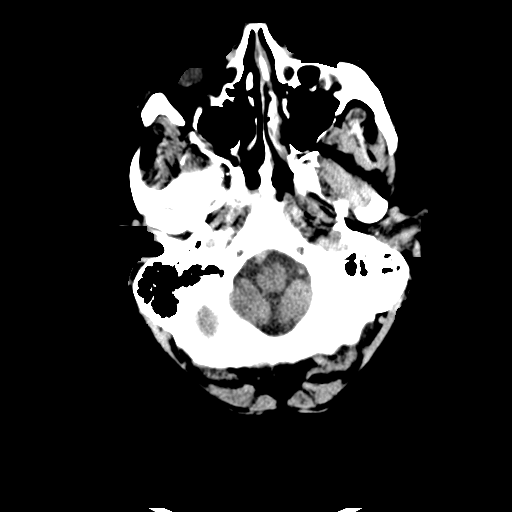
[im 10/35  brain]
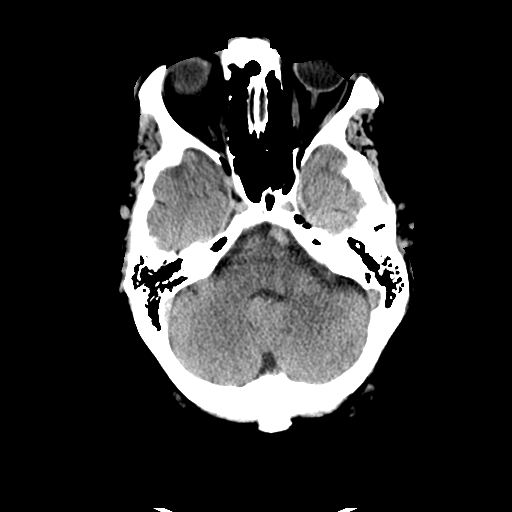
[im 12/35  brain]
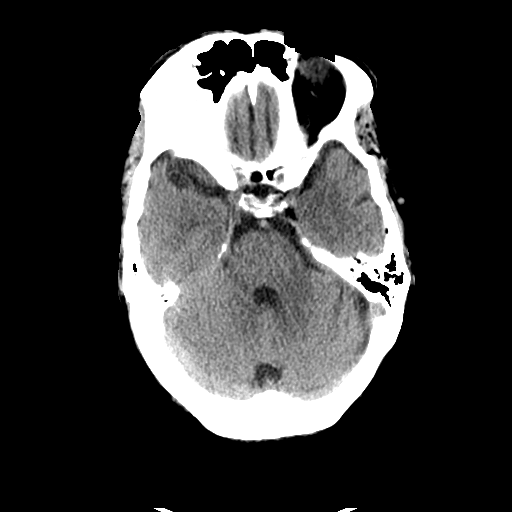
[im 16/35  brain]
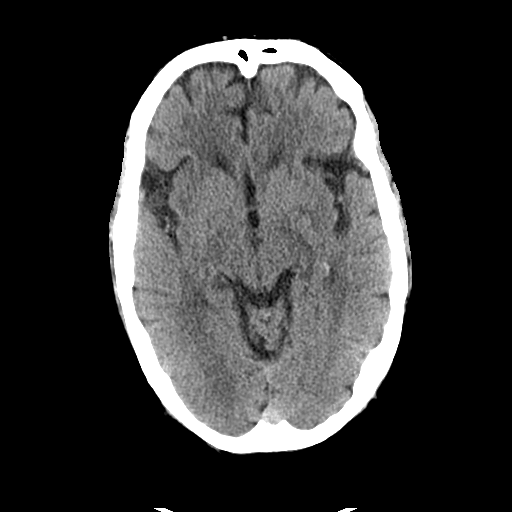
[im 16/35  bone]
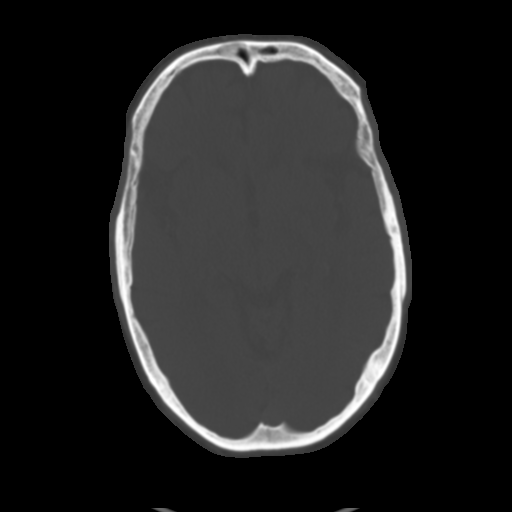
[im 19/35  brain]
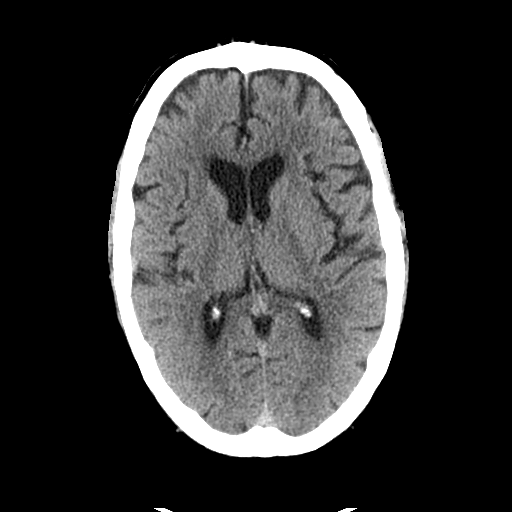
[im 23/35  brain]
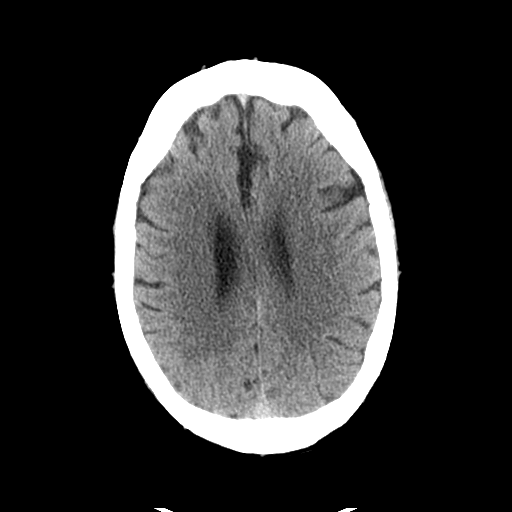
[im 26/35  brain]
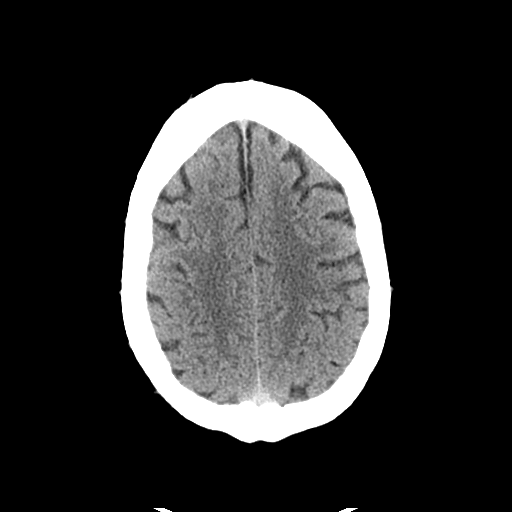
[im 29/35  brain]
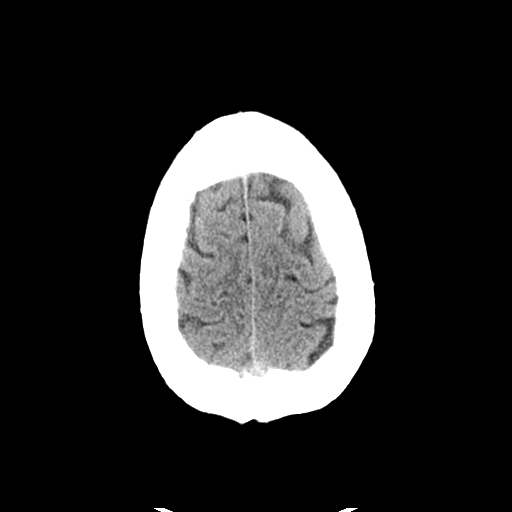
[im 29/35  bone]
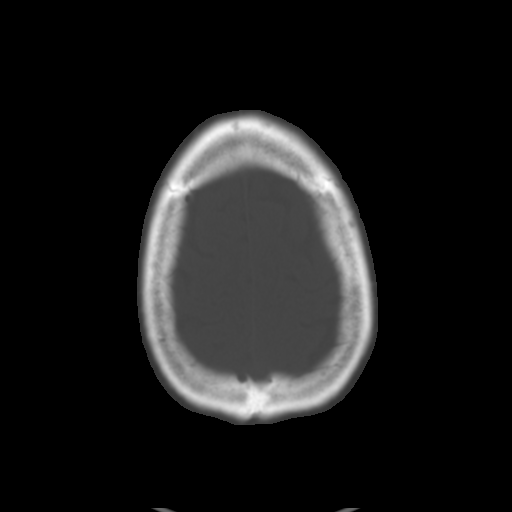
[im 32/35  brain]
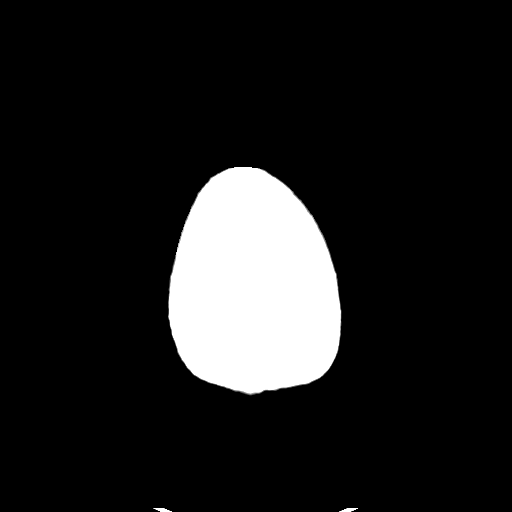

[Series 4: coronal soft tissue · coronal · 0.43mm/px · 3 of 81 slices shown]
[im 27/81  brain]
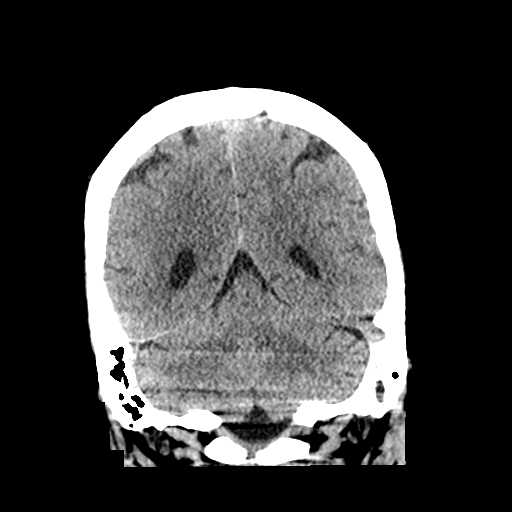
[im 36/81  brain]
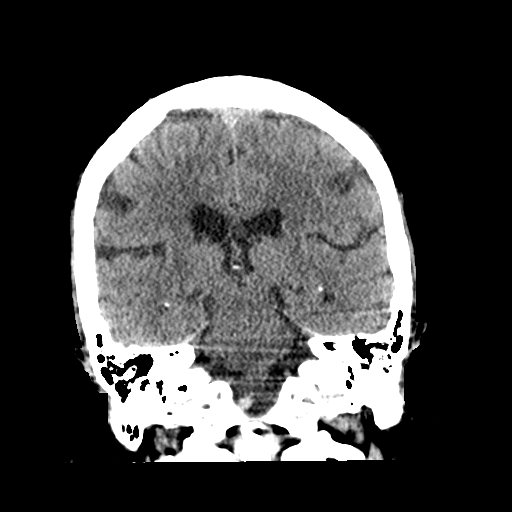
[im 45/81  brain]
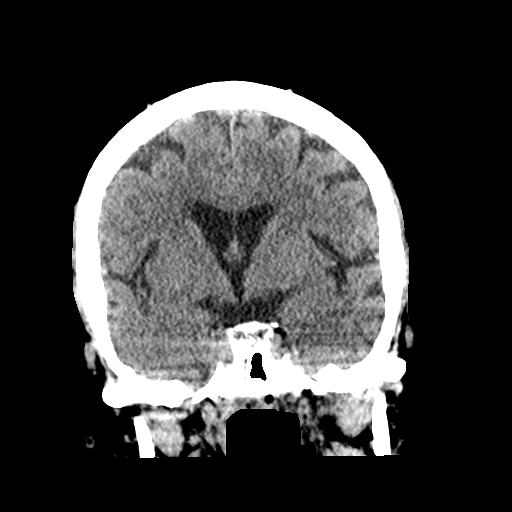

[Series 5: sagittal soft tissue · sagittal · 0.40mm/px · 3 of 84 slices shown]
[im 28/84  brain]
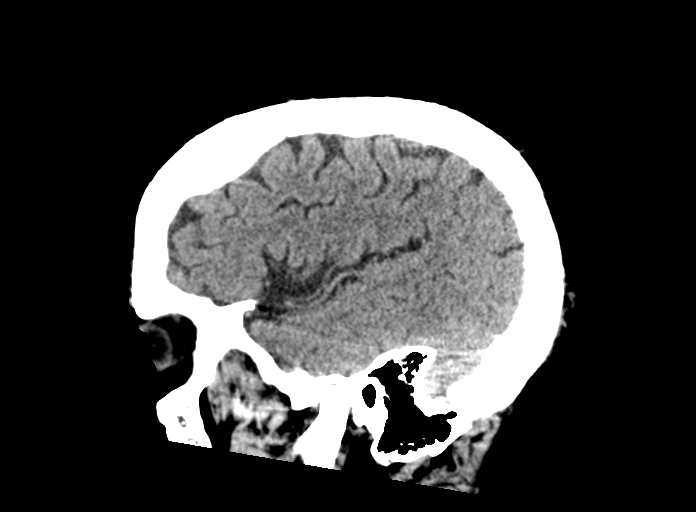
[im 42/84  brain]
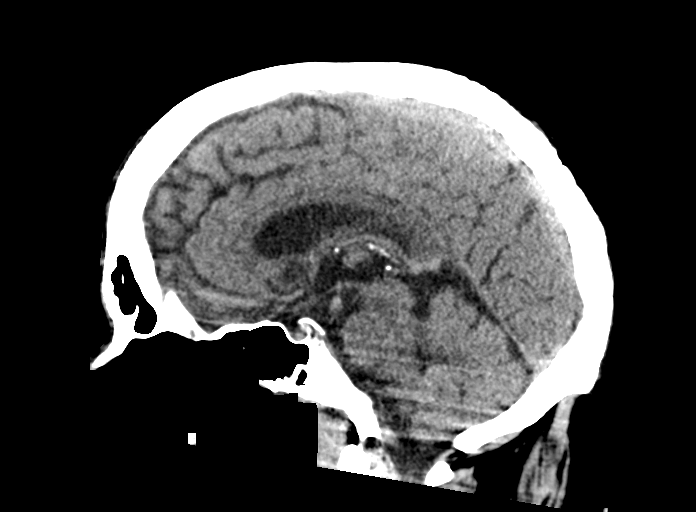
[im 56/84  brain]
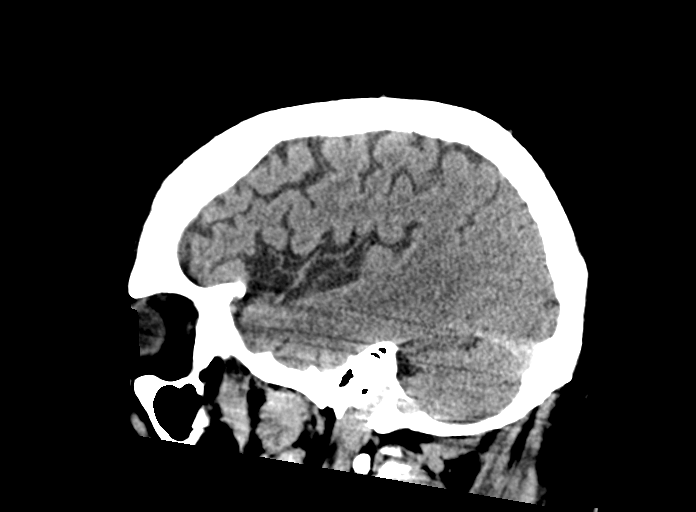

[16 of 47 positions shown; findings below may reference images not displayed]

FINDINGS: Brain: No evidence of acute infarction, hemorrhage, hydrocephalus,
extra-axial collection or mass lesion/mass effect. Moderate brain
parenchymal volume loss and microangiopathy.

Vascular: No hyperdense vessel or unexpected calcification.

Skull: Normal. Negative for fracture or focal lesion.

Sinuses/Orbits: Mucous retention cyst in the right maxillary sinus.
There remainder of the paranasal sinuses and mastoid air cells are
normally aerated.

Other: None.
IMPRESSION: No acute intracranial abnormality.

Moderate brain parenchymal atrophy and chronic microvascular
disease.

## 2019-05-01 IMAGING — DX DG KNEE COMPLETE 4+V*L*
4 series · 4 of 4 positions shown · non-contrast
Comparison: Radiographs July 09, 2016.

CLINICAL DATA: Left knee pain after fall.

EXAM:
LEFT KNEE - COMPLETE 4+ VIEW

[knee ap (1 of 3)]
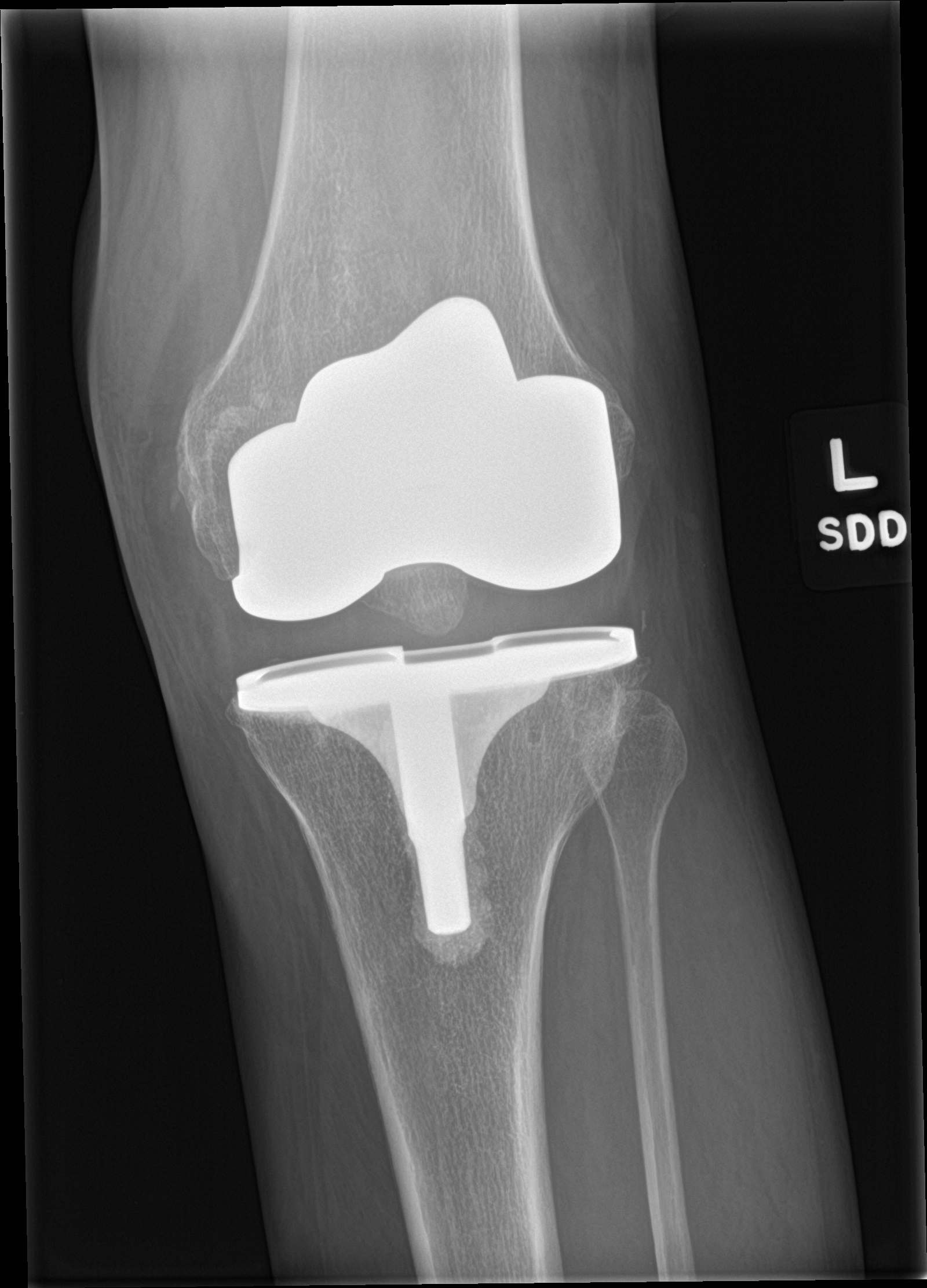

[knee lat]
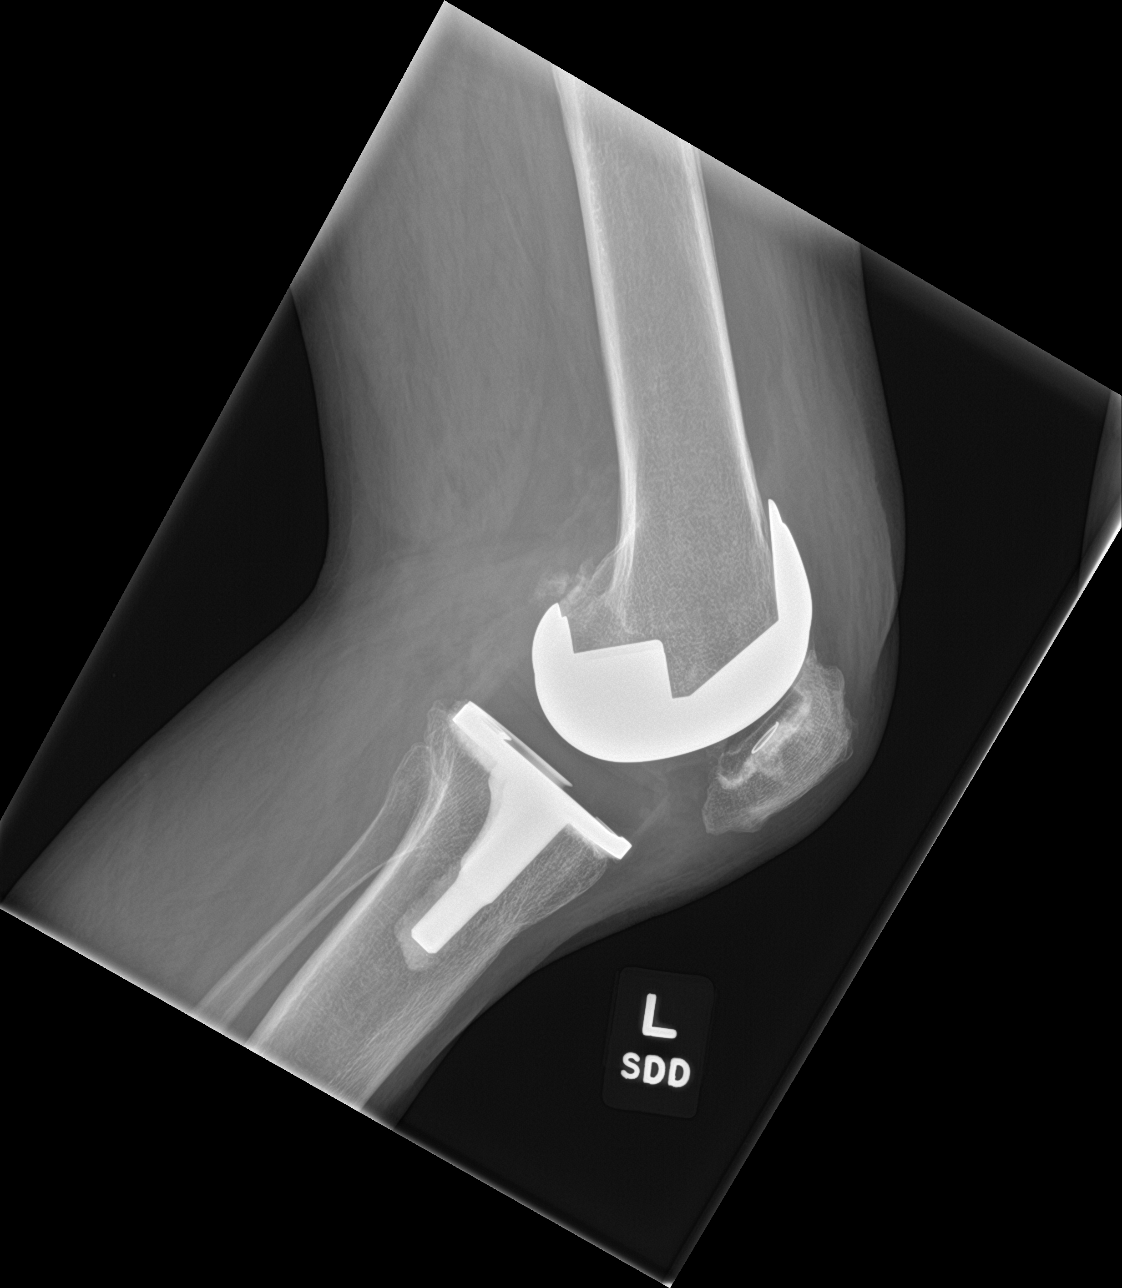

[knee ap (2 of 3)]
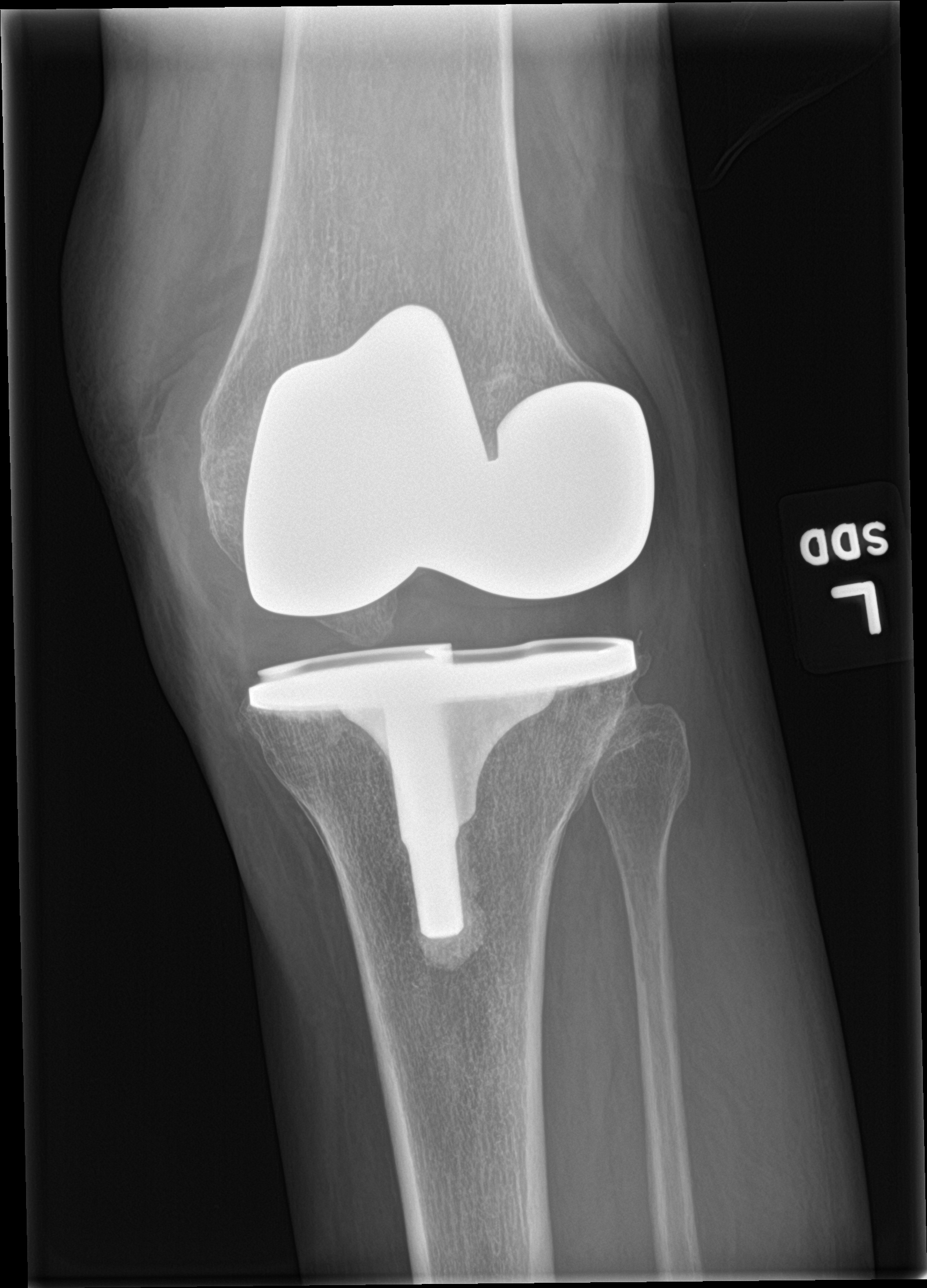

[knee ap (3 of 3)]
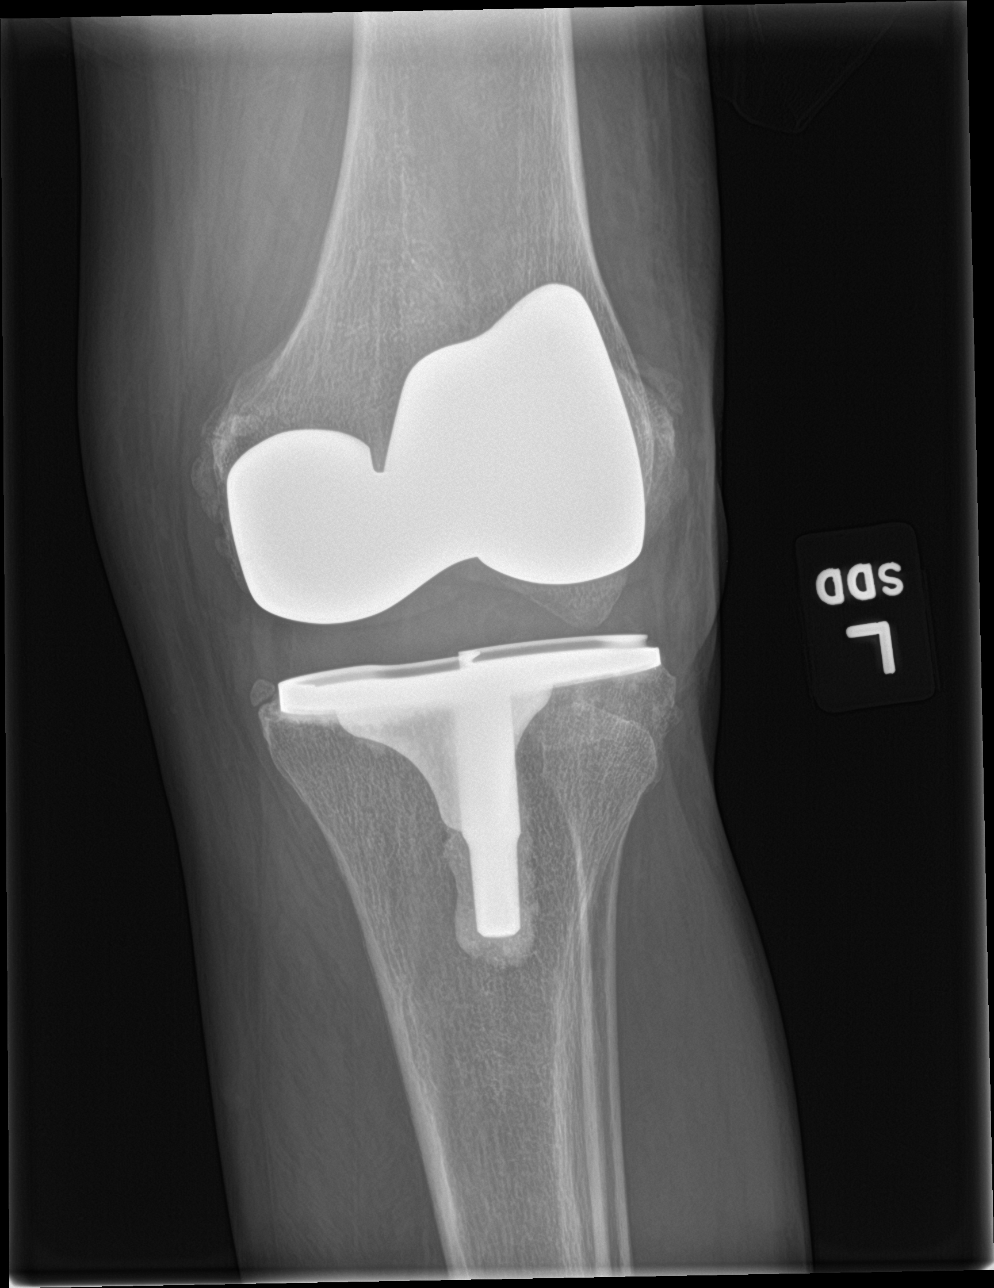

[4 of 4 positions shown; findings below may reference images not displayed]

FINDINGS: Status post left knee arthroplasty. The femoral and tibial
components appear to be well situated. No fracture or dislocation is
noted. No joint effusion is noted.
IMPRESSION: Status post left knee arthroplasty.  No acute abnormality is seen.

## 2019-05-01 IMAGING — DX DG HIP (WITH OR WITHOUT PELVIS) 2-3V*R*
3 series · 3 of 3 positions shown · non-contrast
Comparison: Radiographs July 09, 2016.

CLINICAL DATA: Right hip pain after fall today.

EXAM:
DG HIP (WITH OR WITHOUT PELVIS) 2-3V RIGHT

[hip ap]
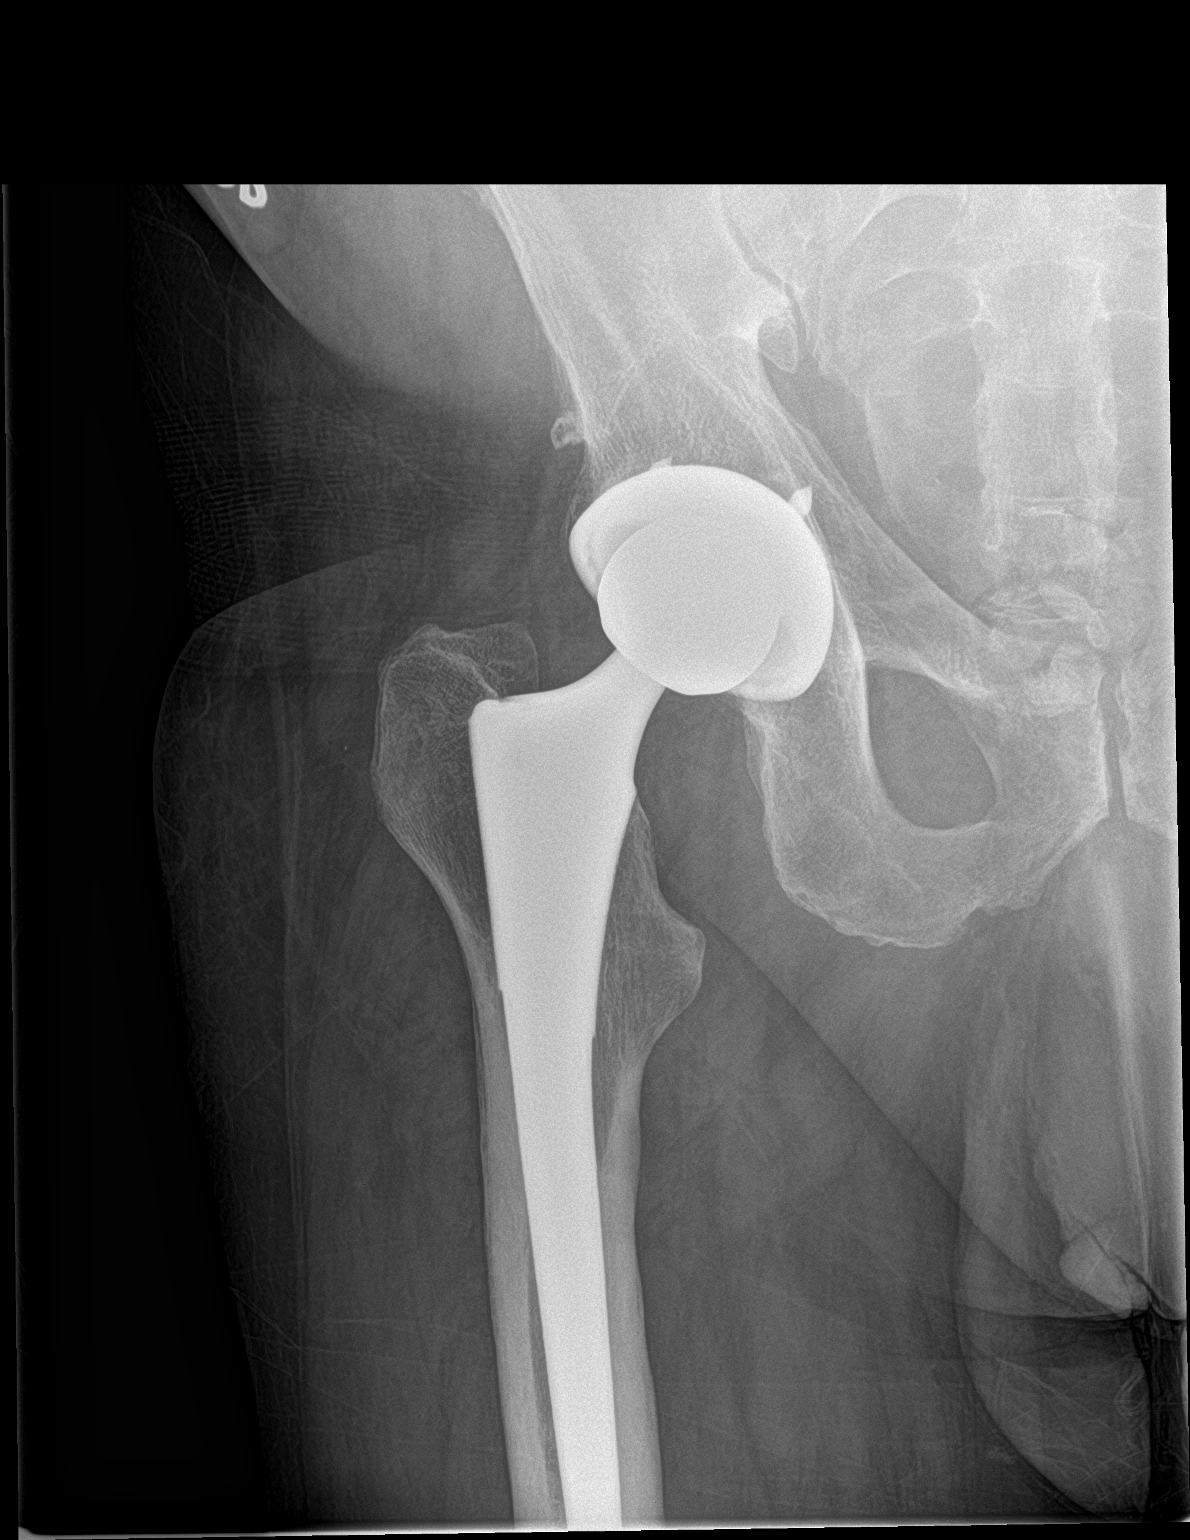

[hip lat]
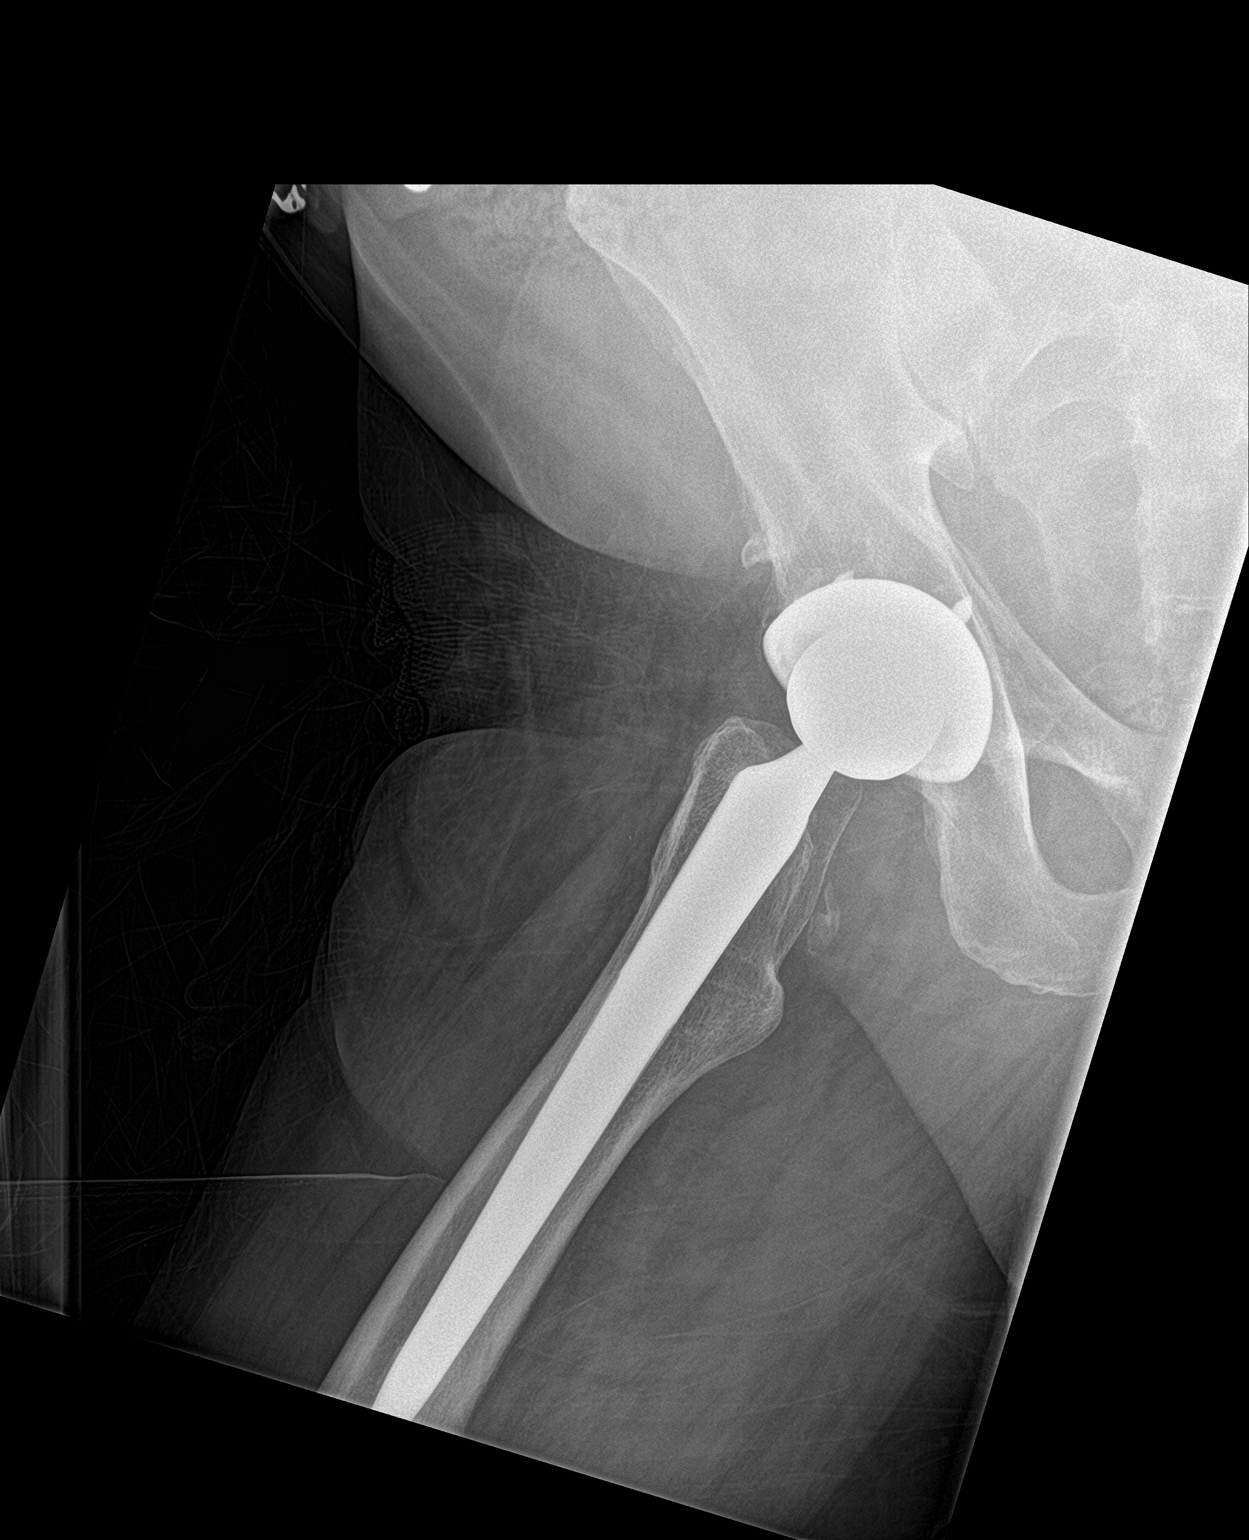

[pelvis ap]
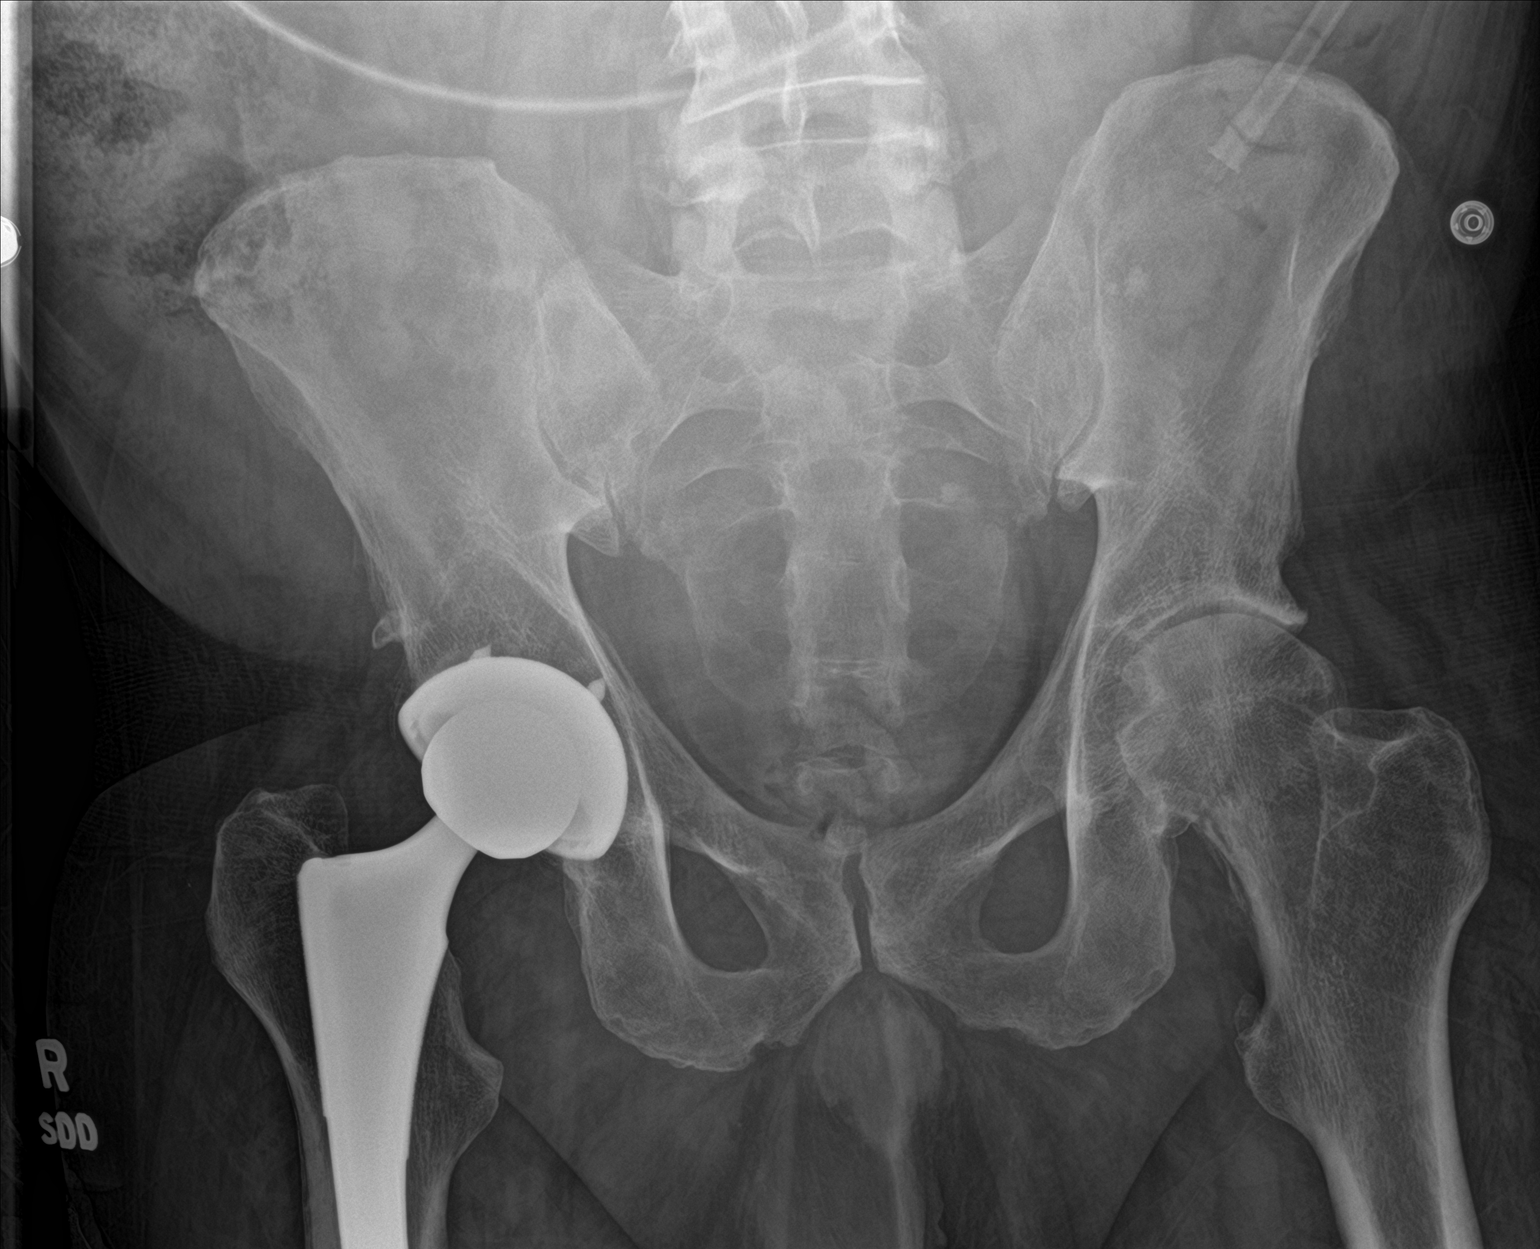

[3 of 3 positions shown; findings below may reference images not displayed]

FINDINGS: Status post right hip arthroplasty. The femoral and acetabular
components appear to be well situated. No fracture or dislocation is
noted.
IMPRESSION: Status post right hip arthroplasty. No acute abnormality seen in the
right hip.

## 2019-05-16 ENCOUNTER — Ambulatory Visit: Payer: Medicare Other | Admitting: Physician Assistant

## 2019-05-20 IMAGING — CT CT HEAD W/O CM
5 of 7 series · 16 of 47 positions shown, 18 images · non-contrast
Comparison: CT head dated August 16, 2016.

CLINICAL DATA: Altered mental status.

EXAM:
CT HEAD WITHOUT CONTRAST
TECHNIQUE: Contiguous axial images were obtained from the base of the skull
through the vertex without intravenous contrast.

[Series 2: head trauma wo · axial · 0.47mm/px · z∈[+9,+129]mm · 6 of 34 slices shown, 8 images (1 of 2)]
[im 5/34  brain]
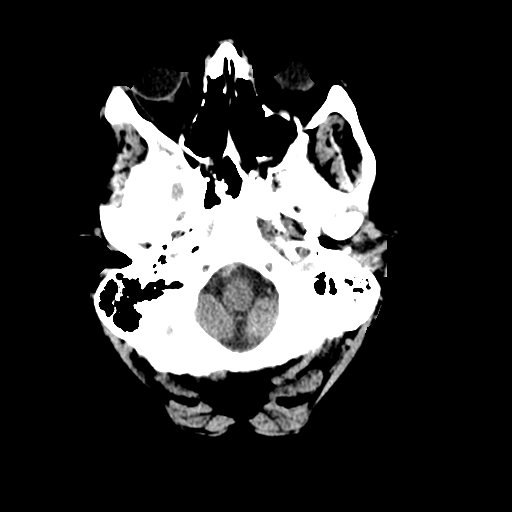
[im 5/34  bone]
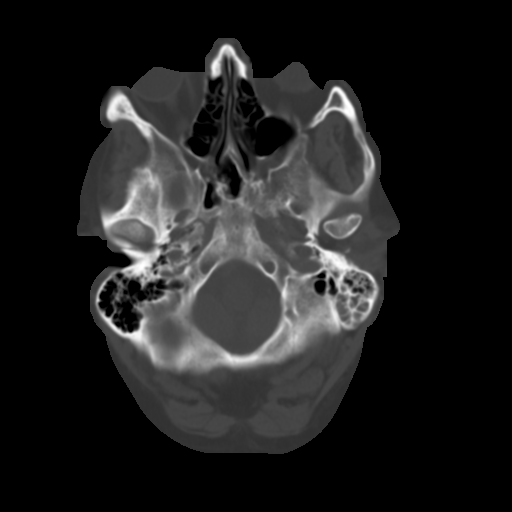
[im 10/34  brain]
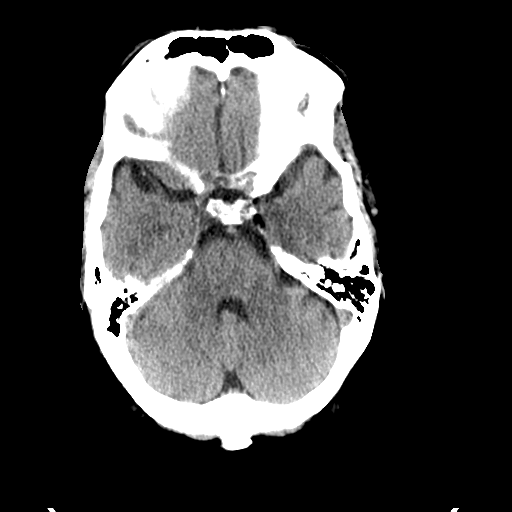
[im 15/34  brain]
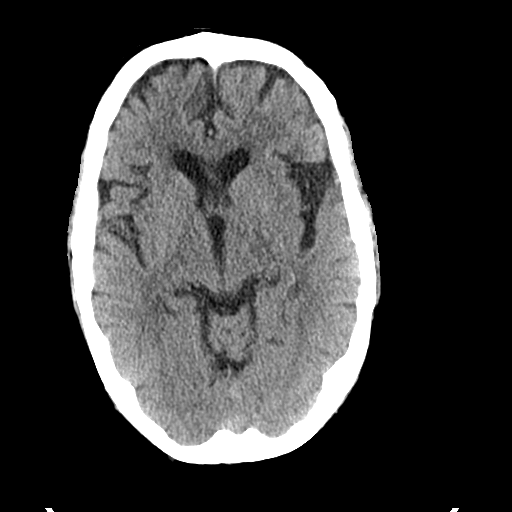
[im 19/34  brain]
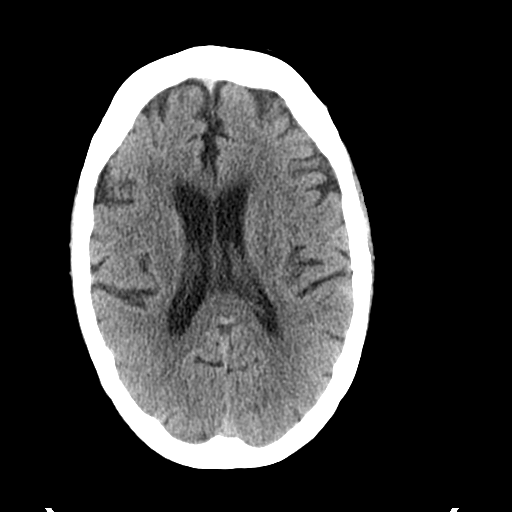
[im 24/34  brain]
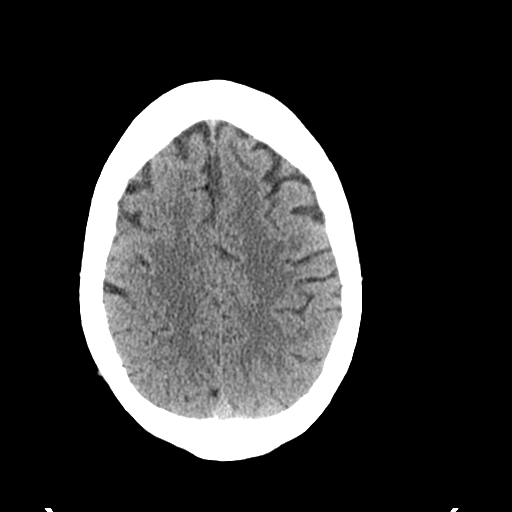
[im 24/34  bone]
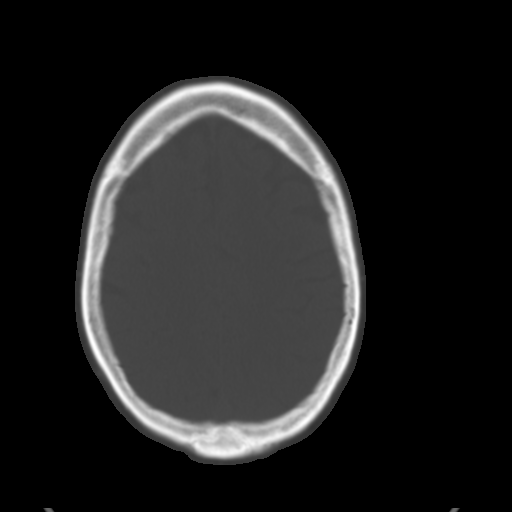
[im 29/34  brain]
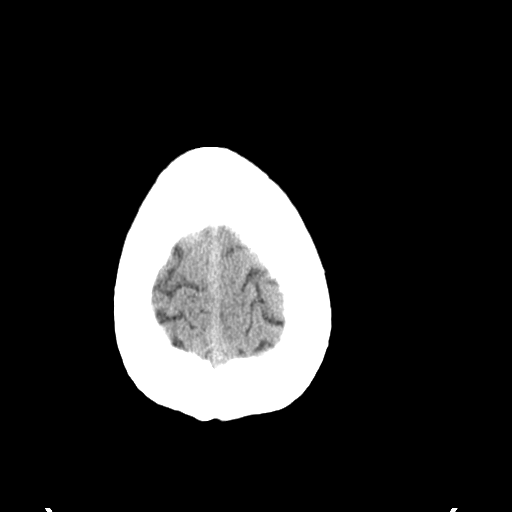

[Series 3: head bone · axial · 0.47mm/px · z∈[+7,+47]mm · 3 of 85 slices shown]
[im 10/85  bone]
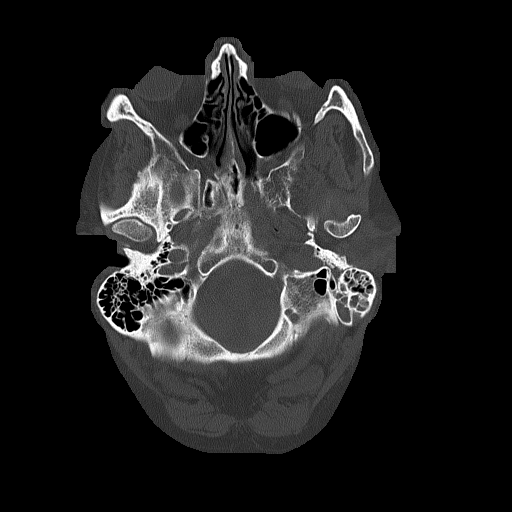
[im 20/85  bone]
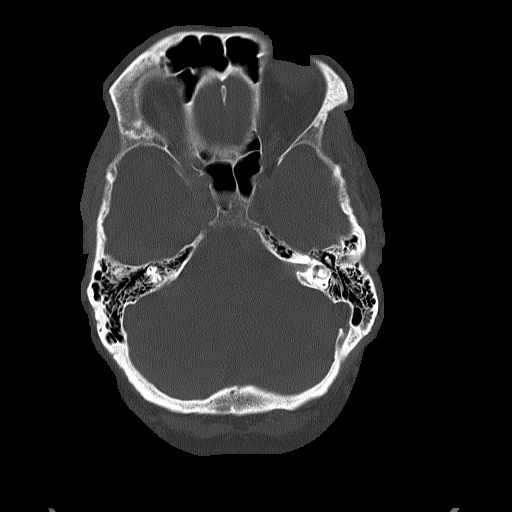
[im 30/85  bone]
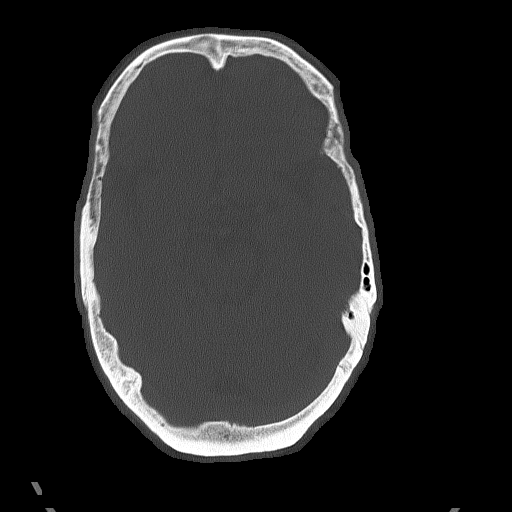

[Series 4: coronal soft tissue · coronal · 0.34mm/px · 3 of 78 slices shown]
[im 20/78  brain]
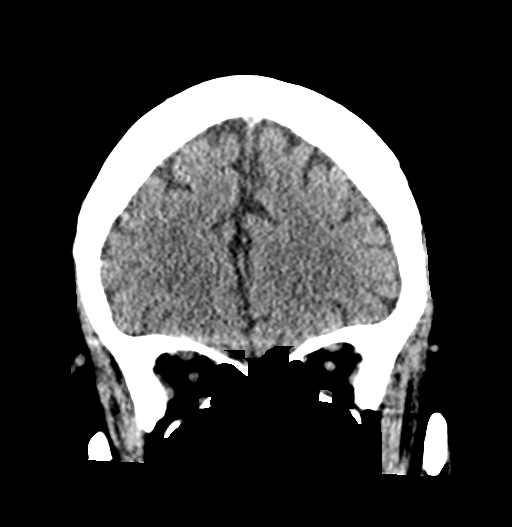
[im 39/78  brain]
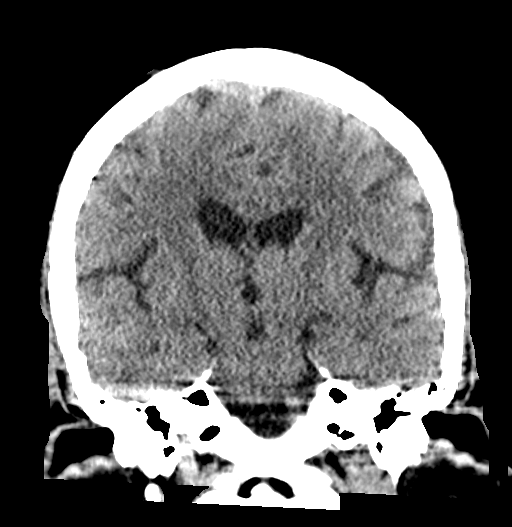
[im 58/78  brain]
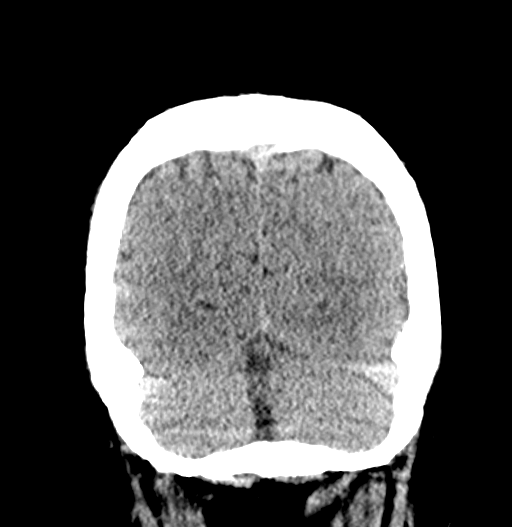

[Series 6: head trauma wo · axial · 0.47mm/px · z∈[+90,+120]mm · 2 of 19 slices shown (2 of 2)]
[im 7/19  brain]
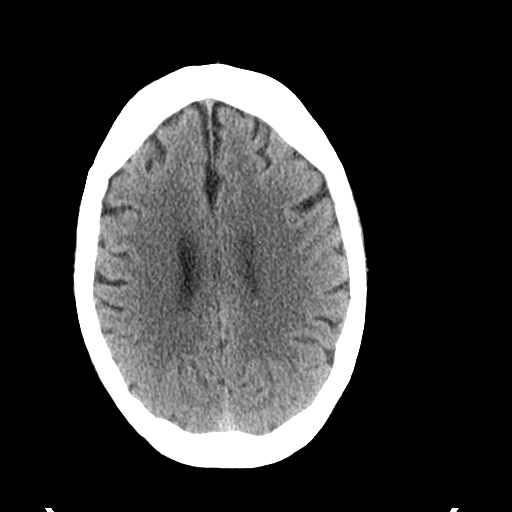
[im 13/19  brain]
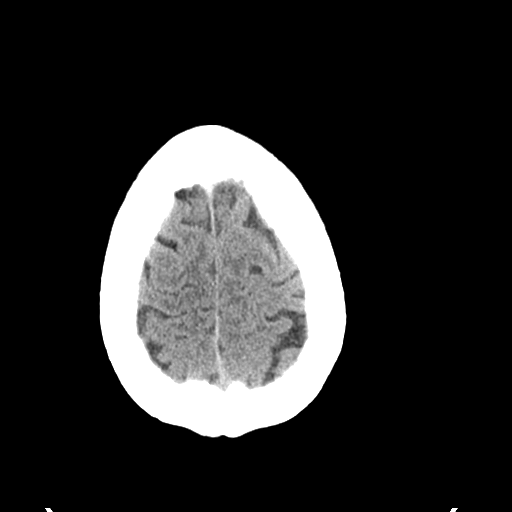

[Series 9: sagittal soft tissue · sagittal · 0.27mm/px · 2 of 59 slices shown]
[im 20/59  brain]
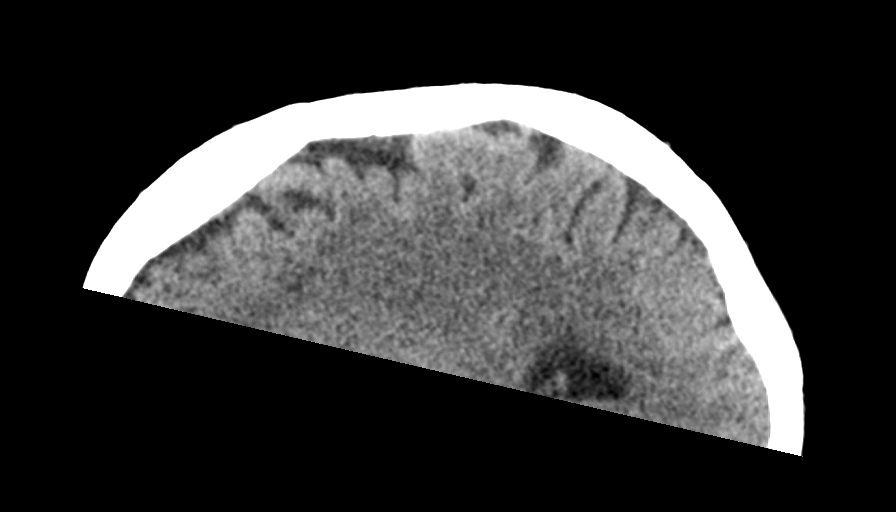
[im 39/59  brain]
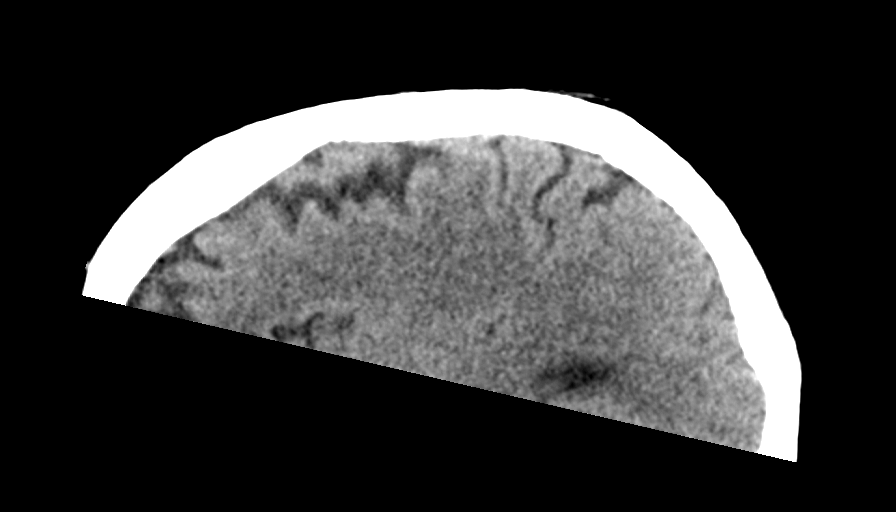

[16 of 47 positions shown; findings below may reference images not displayed]

FINDINGS: Brain: No evidence of acute infarction, hemorrhage, hydrocephalus,
extra-axial collection or mass lesion/mass effect. Mild age-related
cerebral atrophy with compensatory dilatation of the ventricles.

Vascular: No hyperdense vessel or unexpected calcification.

Skull: Normal. Negative for fracture or focal lesion.

Sinuses/Orbits: Large mucous retention cysts in the right maxillary
sinus, unchanged. The remaining paranasal sinuses and mastoid air
cells are clear. The orbits are unremarkable.

Other: None.
IMPRESSION: No acute intracranial abnormality.

## 2019-05-25 ENCOUNTER — Ambulatory Visit (INDEPENDENT_AMBULATORY_CARE_PROVIDER_SITE_OTHER): Payer: Medicare Other | Admitting: Physician Assistant

## 2019-05-25 ENCOUNTER — Other Ambulatory Visit: Payer: Self-pay

## 2019-05-25 ENCOUNTER — Encounter: Payer: Self-pay | Admitting: Orthopedic Surgery

## 2019-05-25 VITALS — Ht 75.0 in | Wt 172.0 lb

## 2019-05-25 DIAGNOSIS — Z89431 Acquired absence of right foot: Secondary | ICD-10-CM

## 2019-05-25 DIAGNOSIS — L97512 Non-pressure chronic ulcer of other part of right foot with fat layer exposed: Secondary | ICD-10-CM

## 2019-05-25 NOTE — Progress Notes (Signed)
Office Visit Note   Patient: [name]           Date of Birth: 07-11-1965           MRN: [id number] Visit Date: 05/25/2019              Requested by: Lucia Gaskins, MD [address] [place],  [place] [address] PCP: Lucia Gaskins, MD  Chief Complaint  Patient presents with  . Right Knee - Routine Post Op    03/22/19 right total knee replacement   . Right Foot - Wound Check  . Left Foot - Wound Check      HPI: Patient presents today 6 weeks status post right total knee revision. He states he feels great on his working well he continues to do his therapy. He has no complaints he is also here to follow-up on his bilateral plantar foot ulcers. He is in the process of getting new shoes with metatarsal relief and getting an appropriate shoe for his transmetatarsal amputation he feels he is slowly improving  Assessment & Plan: Visit Diagnoses: No diagnosis found.  Plan: Donuts were fashioned for his old orthotics today to provide some relief in the areas of the ulcers. He will follow-up in 4 weeks. He is in the process of obtaining new orthotics  Follow-Up Instructions: No follow-ups on file.   Ortho Exam  Patient is alert, oriented, no adenopathy, well-dressed, normal affect, normal respiratory effort. Right knee full range of motion no effusion well-healed surgical scar no cellulitis right foot plantar ulcer on metatarsal head this was debrided back to a healthy soft surface. This does not probe deeply there is no foul odor and minimal drainage  On the left side he has another plantar ulcer over the transmetatarsal amputation stump this also was debrided to a healthy wound edge no surrounding cellulitis no drainage does not probe deeply  Imaging: No results found. No images are attached to the encounter.  Labs: Lab Results  Component Value Date   HGBA1C 6.4 (H) 03/20/2019   HGBA1C 6.4 (H) 10/31/2018   HGBA1C 6.2 (H) 06/13/2018   ESRSEDRATE 20 (H) 01/01/2019   ESRSEDRATE 120 (H) 11/16/2018   ESRSEDRATE 30 (H) 06/13/2018   CRP 1.0 (H) 01/01/2019   CRP 36 (H) 11/16/2018   CRP 5.7 (H) 06/13/2018   REPTSTATUS 11/28/2018 FINAL 11/23/2018   GRAMSTAIN  11/23/2018    FEW WBC PRESENT, PREDOMINANTLY PMN NO ORGANISMS SEEN    CULT  11/23/2018    RARE STAPHYLOCOCCUS AUREUS CRITICAL RESULT CALLED TO, READ BACK BY AND VERIFIED WITH: Deno Lunger RN, AT 1053 11/25/18 BY D. VANHOOK REGARDING CULTURE GROWTH NO ANAEROBES ISOLATED Performed at [facility], [place] [address] [place], [place] [address]    LABORGA STAPHYLOCOCCUS AUREUS 11/23/2018     Lab Results  Component Value Date   ALBUMIN 4.0 03/20/2019   ALBUMIN 2.1 (L) 11/26/2018   ALBUMIN 3.1 (L) 11/16/2018    Lab Results  Component Value Date   MG 1.8 11/28/2018   MG 1.7 11/27/2018   MG 2.0 11/26/2018   No results found for: VD25OH  No results found for: PREALBUMIN CBC EXTENDED Latest Ref Rng & Units 03/22/2019 03/20/2019 01/01/2019  WBC 4.0 - 10.5 K/uL - 11.1(H) 7.1  RBC 4.22 - 5.81 MIL/uL - 5.48 4.02(L)  HGB 13.0 - 17.0 g/dL 9.3(L) 14.3 10.5(L)  HCT 39.0 - 52.0 % 30.0(L) 45.9 34.6(L)  PLT 150 - 400 K/uL - 279 242  NEUTROABS 1.7 -  7.7 K/uL - - 4.9  LYMPHSABS 0.7 - 4.0 K/uL - - 1.5     Body mass index is 21.5 kg/m.  Orders:  No orders of the defined types were placed in this encounter.  No orders of the defined types were placed in this encounter.    Procedures: No procedures performed  Clinical Data: No additional findings.  ROS:  All other systems negative, except as noted in the HPI. Review of Systems  Objective: Vital Signs: Ht 6\' 3"  (1.905 m)   Wt 172 lb (78 kg)   BMI 21.50 kg/m   Specialty Comments:  No specialty comments available.  PMFS History: Patient Active Problem List   Diagnosis Date Noted  . Malnutrition of moderate degree 03/25/2019  . 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 PICC (peripherally inserted central catheter) in  place 12/21/2018  . Medication monitoring encounter 12/21/2018  . Staphylococcus aureus bacteremia   . Infection of total knee replacement ([place])   . Infected prosthetic knee joint, initial encounter ([place])   . Septic joint of right knee joint ([place]) 11/16/2018  . Hypokalemia 11/16/2018  . Anemia 11/16/2018  . Acute encephalopathy 10/31/2018  . Accidental drug overdose 10/31/2018  . Left arm weakness 10/31/2018  . History of transmetatarsal amputation of right foot ([place]) 08/30/2018  . Charcot foot due to diabetes mellitus ([place])   . Cellulitis 06/12/2018  . Diabetic foot infection ([place]) 06/22/2017  . Post op infection 06/22/2017  . SVT (supraventricular tachycardia) ([place]) 08/17/2016  . Tobacco use disorder 10/25/2014  . Diabetes mellitus type 2 in nonobese (HCC)   . Swelling   . Type 2 diabetes mellitus with left diabetic foot ulcer ([place]) 12/18/2013  . Diabetes mellitus due to underlying condition with foot ulcer ([place]) 12/18/2013  . Left leg cellulitis 12/18/2013  . Cocaine abuse ([place]) 03/29/2012  . Generalized anxiety disorder 03/29/2012  . Panic attacks 03/29/2012  . Alcohol dependence ([place]) 08/05/2011    Class: Chronic  . Substance abuse ([place]) 05/31/2011  . Ulcer of left foot, limited to breakdown of skin ([place]) 02/15/2011   Past Medical History:  Diagnosis Date  . Anemia    as a child  . Anxiety   . Arthritis    "everywhere" (04/21/2012)  . Cellulitis and abscess of foot 12/19/2013   LEFT FOOT  . Cellulitis of right foot   . Chronic pain   . Constipation   . DDD (degenerative disc disease)   . Depression    pt denies  . Diabetes mellitus without complication (HCC)    borderline  . Diabetic foot ulcer ([place])   . ETOH abuse   . Full dentures   . GERD (gastroesophageal reflux disease)    tums  . H/O amputation of lesser toe, right ([place]) 07/29/2017  . History of blood transfusion    "related to left knee OR; probably right hip too" (04/21/2012)  . History of kidney stones    . Mental disorder   . MRSA (methicillin resistant staph aureus) culture positive   . Neuromuscular disorder (HCC)    neuropathy  . Noncompliance   . Open wound    bottom of foot  . Osteomyelitis ([place])    right 4th toe  . Osteomyelitis of right foot ([place])   . Pneumonia ~ 2012  . Polysubstance abuse (HCC)    etoh, cocaine, heroin  . Stroke [facility]) 2008   "they said I might have had one during right hip replacement" (04/21/2012)  . Wears glasses  Family History  Problem Relation Age of Onset  . Diabetes Father     Past Surgical History:  Procedure Laterality Date  . AMPUTATION Right 06/09/2017   Procedure: RIGHT 2ND TOE AMPUTATION;  Surgeon: [name], MD;  Location: [facility];  Service: Orthopedics;  Laterality: Right;  . AMPUTATION Right 07/23/2017   Procedure: RIGHT 4TH TOE AMPUTATION;  Surgeon: [name], MD;  Location: [facility];  Service: Orthopedics;  Laterality: Right;  . AMPUTATION Right 06/15/2018   Procedure: RIGHT FOOT TRANSMETATARSAL AMPUTATION;  Surgeon: [name], MD;  Location: [facility];  Service: Orthopedics;  Laterality: Right;  right  . EXCISIONAL TOTAL KNEE ARTHROPLASTY WITH ANTIBIOTIC SPACERS Right 11/23/2018   Procedure: REMOVAL OF RIGHT TOTAL KNEE ARTHROPLASTY AND PLACEMENT OF ANATOMIC ANTIBIOTIC SPACER;  Surgeon: [name], MD;  Location: [facility];  Service: Orthopedics;  Laterality: Right;  . I & D EXTREMITY Right 11/18/2018   Procedure: RIGHT KNEE DEBRIDEMENT;  Surgeon: [name], MD;  Location: [facility];  Service: Orthopedics;  Laterality: Right;  . JOINT REPLACEMENT    . KNEE ARTHROSCOPY Bilateral 1980's/1990's  . LUNG LOBECTOMY Left ~ 2006  . LUNG LOBECTOMY    . METATARSAL OSTEOTOMY  10/29/2011   Procedure: METATARSAL OSTEOTOMY;  Surgeon: [name], MD;  Location: [facility];  Service: Orthopedics;  Laterality: Left;  Left 1st Metatarsal Dorsal Closing Wedge   . METATARSAL OSTEOTOMY Right 04/28/2017   Procedure: RIGHT 1ST METATARSAL DORSAL CLOSING  WEDGE OSTEOTOMY;  Surgeon: [name], MD;  Location: [facility];  Service: Orthopedics;  Laterality: Right;  . MULTIPLE TOOTH EXTRACTIONS    . REVISION TOTAL HIP ARTHROPLASTY Right 2008   "4-5 months after replacement" (04/21/2012)  . TOTAL HIP ARTHROPLASTY Right 2008  . TOTAL KNEE ARTHROPLASTY Left 2006  . TOTAL KNEE ARTHROPLASTY Right 04/20/2012  . TOTAL KNEE ARTHROPLASTY Right 04/20/2012   Procedure: TOTAL KNEE ARTHROPLASTY;  Surgeon: [name], MD;  Location: [facility];  Service: Orthopedics;  Laterality: Right;  Right Total Knee Arthroplasty  . TOTAL KNEE REVISION Right 03/22/2019   Procedure: RIGHT TOTAL KNEE ARTHROPLASTY REVISION OF ALL COMPONENTS;  Surgeon: [name], MD;  Location: [facility];  Service: Orthopedics;  Laterality: Right;   Social History   Occupational History  . Not on file  Tobacco Use  . Smoking status: Current Every Day Smoker    Packs/day: 0.50    Years: 30.00    Pack years: 15.00    Types: Cigarettes  . Smokeless tobacco: Never Used  . Tobacco comment: thinking about quitting, talking with PCP  Substance and Sexual Activity  . Alcohol use: Not Currently    Alcohol/week: 0.0 standard drinks  . Drug use: Not Currently    Types: Cocaine    Comment: Cocaine and heroin - 10 years ago  . Sexual activity: Yes    Birth control/protection: None

## 2019-06-22 ENCOUNTER — Encounter: Payer: Self-pay | Admitting: Orthopedic Surgery

## 2019-06-22 ENCOUNTER — Other Ambulatory Visit: Payer: Self-pay

## 2019-06-22 ENCOUNTER — Ambulatory Visit (INDEPENDENT_AMBULATORY_CARE_PROVIDER_SITE_OTHER): Payer: Medicare Other | Admitting: Orthopedic Surgery

## 2019-06-22 VITALS — Ht 75.0 in | Wt 172.0 lb

## 2019-06-22 DIAGNOSIS — G8929 Other chronic pain: Secondary | ICD-10-CM | POA: Diagnosis not present

## 2019-06-22 DIAGNOSIS — M25561 Pain in right knee: Secondary | ICD-10-CM | POA: Diagnosis not present

## 2019-06-22 DIAGNOSIS — L97521 Non-pressure chronic ulcer of other part of left foot limited to breakdown of skin: Secondary | ICD-10-CM | POA: Diagnosis not present

## 2019-06-22 DIAGNOSIS — Z89431 Acquired absence of right foot: Secondary | ICD-10-CM | POA: Diagnosis not present

## 2019-06-26 ENCOUNTER — Encounter: Payer: Self-pay | Admitting: Orthopedic Surgery

## 2019-06-26 NOTE — Progress Notes (Signed)
Office Visit Note   Patient: [name]           Date of Birth: Sep 15, 1965           MRN: [id number] Visit Date: 06/22/2019              Requested by: Lucia Gaskins, MD [address] [place],  [place] [address] PCP: Lucia Gaskins, MD  Chief Complaint  Patient presents with   Right Knee - Routine Post Op    03/22/19 Right TKA      HPI: Patient is a 54 year old gentleman who presents with ulceration on both feet secondary to his neuropathy status post a right transmetatarsal amputation.  Patient also is status post a right total knee arthroplasty and states he has been having some swelling.  Patient is concerned he may have developed a new infection in the right knee.  Assessment & Plan: Visit Diagnoses:  1. History of transmetatarsal amputation of right foot ([place])   2. Ulcer of left foot, limited to breakdown of skin ([place])   3. Chronic pain of right knee     Plan: Ulcers were debrided x2 both feet importance of quad strengthening was discussed for the right knee no clinical signs of infection.  Follow-Up Instructions: Return in about 3 weeks (around 07/13/2019).   Ortho Exam  Patient is alert, oriented, no adenopathy, well-dressed, normal affect, normal respiratory effort. Examination patient's right knee has some chronic swelling but no significant effusion no tenderness to palpation he has significant quad atrophy.  The patella is midline no extensor lag with elevation varus and valgus stress is stable no tenderness to palpation no signs of infection.  There is no redness no warmth.  Semination of both feet he has a ulcer beneath the first metatarsal head of the left foot.  After informed consent a 10 blade knife was used to debride the skin and soft tissue initially the ulcer was 1 cm diameter post debridement it was 3 cm in diameter Iodosorb and a Band-Aid was applied.  Examination the right foot he has an ulcer beneath the third metatarsal with a right  transmetatarsal amputation.  After informed consent the ulcer was debrided back to bleeding viable healthy tissue Iodosorb and a Band-Aid was applied before debridement the ulcer is 3 cm in diameter after debridement the ulcer was 4 cm in diameter 1 mm deep  Imaging: No results found. No images are attached to the encounter.  Labs: Lab Results  Component Value Date   HGBA1C 6.4 (H) 03/20/2019   HGBA1C 6.4 (H) 10/31/2018   HGBA1C 6.2 (H) 06/13/2018   ESRSEDRATE 20 (H) 01/01/2019   ESRSEDRATE 120 (H) 11/16/2018   ESRSEDRATE 30 (H) 06/13/2018   CRP 1.0 (H) 01/01/2019   CRP 36 (H) 11/16/2018   CRP 5.7 (H) 06/13/2018   REPTSTATUS 11/28/2018 FINAL 11/23/2018   GRAMSTAIN  11/23/2018    FEW WBC PRESENT, PREDOMINANTLY PMN NO ORGANISMS SEEN    CULT  11/23/2018    RARE STAPHYLOCOCCUS AUREUS CRITICAL RESULT CALLED TO, READ BACK BY AND VERIFIED WITH: Deno Lunger RN, AT 1053 11/25/18 BY D. VANHOOK REGARDING CULTURE GROWTH NO ANAEROBES ISOLATED Performed at [facility], [place] [address] [place], [place] [address]    LABORGA STAPHYLOCOCCUS AUREUS 11/23/2018     Lab Results  Component Value Date   ALBUMIN 4.0 03/20/2019   ALBUMIN 2.1 (L) 11/26/2018   ALBUMIN 3.1 (L) 11/16/2018    Lab Results  Component Value Date  MG 1.8 11/28/2018   MG 1.7 11/27/2018   MG 2.0 11/26/2018   No results found for: VD25OH  No results found for: PREALBUMIN CBC EXTENDED Latest Ref Rng & Units 03/22/2019 03/20/2019 01/01/2019  WBC 4.0 - 10.5 K/uL - 11.1(H) 7.1  RBC 4.22 - 5.81 MIL/uL - 5.48 4.02(L)  HGB 13.0 - 17.0 g/dL 9.3(L) 14.3 10.5(L)  HCT 39.0 - 52.0 % 30.0(L) 45.9 34.6(L)  PLT 150 - 400 K/uL - 279 242  NEUTROABS 1.7 - 7.7 K/uL - - 4.9  LYMPHSABS 0.7 - 4.0 K/uL - - 1.5     Body mass index is 21.5 kg/m.  Orders:  No orders of the defined types were placed in this encounter.  No orders of the defined types were placed in this encounter.    Procedures: No procedures  performed  Clinical Data: No additional findings.  ROS:  All other systems negative, except as noted in the HPI. Review of Systems  Objective: Vital Signs: Ht 6\' 3"  (1.905 m)    Wt 172 lb (78 kg)    BMI 21.50 kg/m   Specialty Comments:  No specialty comments available.  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[place])    Infected prosthetic knee joint, initial encounter ([place])    Septic joint of right knee joint ([place]) 11/16/2018   Hypokalemia 11/16/2018   Anemia 11/16/2018   Acute encephalopathy 10/31/2018   Accidental drug overdose 10/31/2018   Left arm weakness 10/31/2018   History of transmetatarsal amputation of right foot ([place]) 08/30/2018   Charcot foot due to diabetes mellitus ([place])    Cellulitis 06/12/2018   Diabetic foot infection ([place]) 06/22/2017   Post op infection 06/22/2017   SVT (supraventricular tachycardia) ([place]) 08/17/2016   Tobacco use disorder 10/25/2014   Diabetes mellitus type 2 in nonobese ([place])    Swelling    Type 2 diabetes mellitus with left diabetic foot ulcer ([place]) 12/18/2013   Diabetes mellitus due to underlying condition with foot ulcer ([place]) 12/18/2013   Left leg cellulitis 12/18/2013   Cocaine abuse ([place]) 03/29/2012   Generalized anxiety disorder 03/29/2012   Panic attacks 03/29/2012   Alcohol dependence ([place]) 08/05/2011    Class: Chronic   Substance abuse ([place]) 05/31/2011   Ulcer of left foot, limited to breakdown of skin ([place]) 02/15/2011   Past Medical History:  Diagnosis Date   Anemia    as a child   Anxiety    Arthritis    "everywhere" (04/21/2012)   Cellulitis and abscess of foot  12/19/2013   LEFT FOOT   Cellulitis of right foot    Chronic pain    Constipation    DDD (degenerative disc disease)    Depression    pt denies   Diabetes mellitus without complication (HCC)    borderline   Diabetic foot ulcer (HCC)    ETOH abuse    Full dentures    GERD (gastroesophageal reflux disease)    tums   H/O amputation of lesser toe, right ([place]) 07/29/2017   History of blood transfusion    "related to left knee OR; probably right hip too" (04/21/2012)   History of kidney stones    Mental disorder    MRSA (  methicillin resistant staph aureus) culture positive    Neuromuscular disorder ([place])    neuropathy   Noncompliance    Open wound    bottom of foot   Osteomyelitis ([place])    right 4th toe   Osteomyelitis of right foot ([place])    Pneumonia ~ 2012   Polysubstance abuse ([place])    etoh, cocaine, heroin   Stroke [facility]) 2008   "they said I might have had one during right hip replacement" (04/21/2012)   Wears glasses     Family History  Problem Relation Age of Onset   Diabetes Father     Past Surgical History:  Procedure Laterality Date   AMPUTATION Right 06/09/2017   Procedure: RIGHT 2ND TOE AMPUTATION;  Surgeon: [name], MD;  Location: [facility];  Service: Orthopedics;  Laterality: Right;   AMPUTATION Right 07/23/2017   Procedure: RIGHT 4TH TOE AMPUTATION;  Surgeon: [name], MD;  Location: [facility];  Service: Orthopedics;  Laterality: Right;   AMPUTATION Right 06/15/2018   Procedure: RIGHT FOOT TRANSMETATARSAL AMPUTATION;  Surgeon: [name], MD;  Location: [facility];  Service: Orthopedics;  Laterality: Right;  right   EXCISIONAL TOTAL KNEE ARTHROPLASTY WITH ANTIBIOTIC SPACERS Right 11/23/2018   Procedure: REMOVAL OF RIGHT TOTAL KNEE ARTHROPLASTY AND PLACEMENT OF ANATOMIC ANTIBIOTIC SPACER;  Surgeon: [name], MD;  Location: [facility];  Service: Orthopedics;  Laterality: Right;   I & D EXTREMITY Right 11/18/2018   Procedure: RIGHT KNEE  DEBRIDEMENT;  Surgeon: [name], MD;  Location: [facility];  Service: Orthopedics;  Laterality: Right;   JOINT REPLACEMENT     KNEE ARTHROSCOPY Bilateral 1980's/1990's   LUNG LOBECTOMY Left ~ 2006   LUNG LOBECTOMY     METATARSAL OSTEOTOMY  10/29/2011   Procedure: METATARSAL OSTEOTOMY;  Surgeon: [name], MD;  Location: [facility];  Service: Orthopedics;  Laterality: Left;  Left 1st Metatarsal Dorsal Closing Wedge    METATARSAL OSTEOTOMY Right 04/28/2017   Procedure: RIGHT 1ST METATARSAL DORSAL CLOSING WEDGE OSTEOTOMY;  Surgeon: [name], MD;  Location: [facility];  Service: Orthopedics;  Laterality: Right;   MULTIPLE TOOTH EXTRACTIONS     REVISION TOTAL HIP ARTHROPLASTY Right 2008   "4-5 months after replacement" (04/21/2012)   TOTAL HIP ARTHROPLASTY Right 2008   TOTAL KNEE ARTHROPLASTY Left 2006   TOTAL KNEE ARTHROPLASTY Right 04/20/2012   TOTAL KNEE ARTHROPLASTY Right 04/20/2012   Procedure: TOTAL KNEE ARTHROPLASTY;  Surgeon: [name], MD;  Location: [facility];  Service: Orthopedics;  Laterality: Right;  Right Total Knee Arthroplasty   TOTAL KNEE REVISION Right 03/22/2019   Procedure: RIGHT TOTAL KNEE ARTHROPLASTY REVISION OF ALL COMPONENTS;  Surgeon: [name], MD;  Location: [facility];  Service: Orthopedics;  Laterality: Right;   Social History   Occupational History   Not on file  Tobacco Use   Smoking status: Current Every Day Smoker    Packs/day: 0.50    Years: 30.00    Pack years: 15.00    Types: Cigarettes   Smokeless tobacco: Never Used   Tobacco comment: thinking about quitting, talking with PCP  Substance and Sexual Activity   Alcohol use: Not Currently    Alcohol/week: 0.0 standard drinks   Drug use: Not Currently    Types: Cocaine    Comment: Cocaine and heroin - 10 years ago   Sexual activity: Yes    Birth control/protection: None

## 2019-07-13 ENCOUNTER — Ambulatory Visit: Payer: Medicare Other | Admitting: Physician Assistant

## 2019-07-15 ENCOUNTER — Emergency Department (HOSPITAL_COMMUNITY): Payer: Medicare Other

## 2019-07-15 ENCOUNTER — Encounter (HOSPITAL_COMMUNITY): Payer: Self-pay | Admitting: Emergency Medicine

## 2019-07-15 ENCOUNTER — Other Ambulatory Visit: Payer: Self-pay

## 2019-07-15 ENCOUNTER — Emergency Department (HOSPITAL_COMMUNITY)
Admission: EM | Admit: 2019-07-15 | Discharge: 2019-07-15 | Disposition: A | Discharge status: Home / Self Care | Payer: Medicare Other | Attending: Emergency Medicine | Admitting: Emergency Medicine

## 2019-07-15 DIAGNOSIS — S0990XA Unspecified injury of head, initial encounter: Secondary | ICD-10-CM | POA: Diagnosis not present

## 2019-07-15 DIAGNOSIS — F1410 Cocaine abuse, uncomplicated: Secondary | ICD-10-CM | POA: Diagnosis not present

## 2019-07-15 DIAGNOSIS — Z79899 Other long term (current) drug therapy: Secondary | ICD-10-CM | POA: Insufficient documentation

## 2019-07-15 DIAGNOSIS — E871 Hypo-osmolality and hyponatremia: Secondary | ICD-10-CM | POA: Diagnosis not present

## 2019-07-15 DIAGNOSIS — F17210 Nicotine dependence, cigarettes, uncomplicated: Secondary | ICD-10-CM | POA: Diagnosis not present

## 2019-07-15 DIAGNOSIS — Z7982 Long term (current) use of aspirin: Secondary | ICD-10-CM | POA: Diagnosis not present

## 2019-07-15 DIAGNOSIS — W19XXXA Unspecified fall, initial encounter: Secondary | ICD-10-CM | POA: Insufficient documentation

## 2019-07-15 DIAGNOSIS — F10920 Alcohol use, unspecified with intoxication, uncomplicated: Secondary | ICD-10-CM | POA: Diagnosis not present

## 2019-07-15 DIAGNOSIS — R52 Pain, unspecified: Secondary | ICD-10-CM

## 2019-07-15 LAB — BASIC METABOLIC PANEL
Anion gap: 15 (ref 5–15)
BUN: 12 mg/dL (ref 6–20)
CO2: 18 mmol/L — ABNORMAL LOW (ref 22–32)
Calcium: 8.4 mg/dL — ABNORMAL LOW (ref 8.9–10.3)
Chloride: 97 mmol/L — ABNORMAL LOW (ref 98–111)
Creatinine, Ser: 0.65 mg/dL (ref 0.61–1.24)
GFR calc Af Amer: >60 mL/min (ref >60)
GFR calc non Af Amer: >60 mL/min (ref >60)
Glucose, Bld: 126 mg/dL — ABNORMAL HIGH (ref 70–99)
Potassium: 3.5 mmol/L (ref 3.5–5.1)
Sodium: 130 mmol/L — ABNORMAL LOW (ref 135–145)

## 2019-07-15 LAB — RAPID URINE DRUG SCREEN, HOSP PERFORMED
Amphetamines: NOT DETECTED
Barbiturates: NOT DETECTED
Benzodiazepines: NOT DETECTED
Cocaine: POSITIVE — AB
Opiates: NOT DETECTED
Tetrahydrocannabinol: NOT DETECTED

## 2019-07-15 LAB — CBC WITH DIFFERENTIAL/PLATELET
Abs Immature Granulocytes: 0.04 10*3/uL (ref 0.00–0.07)
Basophils Absolute: 0.1 10*3/uL (ref 0.0–0.1)
Basophils Relative: 1 %
Eosinophils Absolute: 0.1 10*3/uL (ref 0.0–0.5)
Eosinophils Relative: 1 %
HCT: 33.5 % — ABNORMAL LOW (ref 39.0–52.0)
Hemoglobin: 10.4 g/dL — ABNORMAL LOW (ref 13.0–17.0)
Immature Granulocytes: 1 %
Lymphocytes Relative: 29 %
Lymphs Abs: 2.5 10*3/uL (ref 0.7–4.0)
MCH: 21.9 pg — ABNORMAL LOW (ref 26.0–34.0)
MCHC: 31 g/dL (ref 30.0–36.0)
MCV: 70.7 fL — ABNORMAL LOW (ref 80.0–100.0)
Monocytes Absolute: 0.6 10*3/uL (ref 0.1–1.0)
Monocytes Relative: 7 %
Neutro Abs: 5.4 10*3/uL (ref 1.7–7.7)
Neutrophils Relative %: 61 %
Platelets: 280 10*3/uL (ref 150–400)
RBC: 4.74 MIL/uL (ref 4.22–5.81)
RDW: 19.3 % — ABNORMAL HIGH (ref 11.5–15.5)
WBC: 8.7 10*3/uL (ref 4.0–10.5)
nRBC: 0 % (ref 0.0–0.2)

## 2019-07-15 LAB — COMPREHENSIVE METABOLIC PANEL
ALT: 18 U/L (ref 0–44)
AST: 34 U/L (ref 15–41)
Albumin: 3.7 g/dL (ref 3.5–5.0)
Alkaline Phosphatase: 81 U/L (ref 38–126)
Anion gap: 13 (ref 5–15)
BUN: 12 mg/dL (ref 6–20)
CO2: 18 mmol/L — ABNORMAL LOW (ref 22–32)
Calcium: 8.1 mg/dL — ABNORMAL LOW (ref 8.9–10.3)
Chloride: 94 mmol/L — ABNORMAL LOW (ref 98–111)
Creatinine, Ser: 0.62 mg/dL (ref 0.61–1.24)
GFR calc Af Amer: >60 mL/min (ref >60)
GFR calc non Af Amer: >60 mL/min (ref >60)
Glucose, Bld: 96 mg/dL (ref 70–99)
Potassium: 3.7 mmol/L (ref 3.5–5.1)
Sodium: 125 mmol/L — ABNORMAL LOW (ref 135–145)
Total Bilirubin: 0.4 mg/dL (ref 0.3–1.2)
Total Protein: 6.5 g/dL (ref 6.5–8.1)

## 2019-07-15 LAB — ETHANOL: Alcohol, Ethyl (B): 199 mg/dL — ABNORMAL HIGH (ref <10)

## 2019-07-15 MED ORDER — IBUPROFEN 400 MG PO TABS
600.0000 mg | ORAL_TABLET | Freq: Once | ORAL | Status: AC
  Administered 2019-07-15: 600 mg via ORAL
  Filled 2019-07-15: qty 2

## 2019-07-15 MED ORDER — SODIUM CHLORIDE 0.9 % IV BOLUS
1000.0000 mL | Freq: Once | INTRAVENOUS | Status: AC
  Administered 2019-07-15: 1000 mL via INTRAVENOUS

## 2019-07-15 MED ORDER — ACETAMINOPHEN 325 MG PO TABS
650.0000 mg | ORAL_TABLET | Freq: Once | ORAL | Status: AC
  Administered 2019-07-15: 650 mg via ORAL
  Filled 2019-07-15: qty 2

## 2019-07-15 NOTE — ED Notes (Signed)
Pt given warm blanket.

## 2019-07-15 NOTE — ED Triage Notes (Signed)
Pt here after a fall tonight, pt has approx 20+ beers tonight. Pt c/o lower back pain, hip pain and knee pain.

## 2019-07-15 NOTE — ED Notes (Signed)
Fluids given.

## 2019-07-15 NOTE — ED Notes (Signed)
Patient continues to constantly move constantly in bed. Iv reinforced.

## 2019-07-15 NOTE — Discharge Instructions (Addendum)
You need to stop drinking and using cocaine again!!! Look at the resource guide to get help. Use ice packs on the bruising on your left hip.  The bruising is going to slowly drift down your leg and can end up down around your foot.  I would expect that and do not be alarmed by that.  You can take ibuprofen 600 mg plus acetaminophen 1000 mg every 6 hours as needed for pain.  You should still have pain pills left from your monthly supply. If you need more, contact Dr Cindie Laroche.

## 2019-07-15 NOTE — ED Notes (Signed)
Patient refuses ice pack

## 2019-07-15 NOTE — ED Provider Notes (Signed)
[facility] EMERGENCY DEPARTMENT Provider Note   CSN: [id number] Arrival date & time: 07/15/19  0201   Time seen 2:56 AM  History Chief Complaint  Patient presents with  . Fall  . Alcohol Intoxication    [name] is a 54 y.o. male.  HPI   Patient states he has been sober for about 3 to 4 years, however he was talking to a friend who drinks a pint a day and he was walking by the liquor store and he went in and started drinking about 5 days ago.  He states he is on a binge episode.  He states he has had 20 beers today.  He states today he was in his living room when he fell backwards and landed on his left hip.  He states he has a lot of bruising and pain in his hip.  He also states his right hip hurts.  He states he hit the left side of his head but he denies neck pain.  He denies any facial pain or rib pain.  He states he has been unable to walk since he fell.  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 Sharol Given.  PCP Lucia Gaskins, MD Orthopedics Dr Sharol Given  Past Medical History:  Diagnosis Date  . Anemia    as a child  . Anxiety   . Arthritis    "everywhere" (04/21/2012)  . Cellulitis and abscess of foot 12/19/2013   LEFT FOOT  . Cellulitis of right foot   . Chronic pain   . Constipation   . DDD (degenerative disc disease)   . Depression    pt denies  . Diabetes mellitus without complication (HCC)    borderline  . Diabetic foot ulcer ([place])   . ETOH abuse   . Full dentures   . GERD (gastroesophageal reflux disease)    tums  . H/O amputation of lesser toe, right ([place]) 07/29/2017  . History of blood transfusion    "related to left knee OR; probably right hip too" (04/21/2012)  . History of kidney stones   . Mental disorder   . MRSA (methicillin resistant staph aureus) culture positive   . Neuromuscular disorder (HCC)    neuropathy  . Noncompliance     . Open wound    bottom of foot  . Osteomyelitis ([place])    right 4th toe  . Osteomyelitis of right foot ([place])   . Pneumonia ~ 2012  . Polysubstance abuse (HCC)    etoh, cocaine, heroin  . Stroke [facility]) 2008   "they said I might have had one during right hip replacement" (04/21/2012)  . Wears glasses     Patient Active Problem List   Diagnosis Date Noted  . Malnutrition of moderate degree 03/25/2019  . 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 PICC (peripherally inserted central catheter) in place 12/21/2018  . Medication monitoring encounter 12/21/2018  . Staphylococcus aureus bacteremia   . Infection of total knee replacement ([place])   . Infected prosthetic knee joint, initial encounter ([place])   . Septic joint of right knee joint ([place]) 11/16/2018  . Hypokalemia 11/16/2018  . Anemia 11/16/2018  . Acute encephalopathy 10/31/2018  . Accidental drug overdose 10/31/2018  . Left arm weakness 10/31/2018  . History of transmetatarsal amputation of right foot ([place]) 08/30/2018  . Charcot foot due to diabetes mellitus ([place])   .  Cellulitis 06/12/2018  . Diabetic foot infection ([place]) 06/22/2017  . Post op infection 06/22/2017  . SVT (supraventricular tachycardia) ([place]) 08/17/2016  . Tobacco use disorder 10/25/2014  . Diabetes mellitus type 2 in nonobese (HCC)   . Swelling   . Type 2 diabetes mellitus with left diabetic foot ulcer ([place]) 12/18/2013  . Diabetes mellitus due to underlying condition with foot ulcer ([place]) 12/18/2013  . Left leg cellulitis 12/18/2013  . Cocaine abuse ([place]) 03/29/2012  . Generalized anxiety disorder 03/29/2012  . Panic attacks 03/29/2012  . Alcohol dependence ([place]) 08/05/2011    Class: Chronic  . Substance abuse ([place]) 05/31/2011  . Ulcer of left foot, limited to breakdown of skin ([place]) 02/15/2011    Past Surgical History:  Procedure Laterality Date  . AMPUTATION Right 06/09/2017   Procedure: RIGHT 2ND TOE AMPUTATION;  Surgeon: [name],  MD;  Location: [place];  Service: Orthopedics;  Laterality: Right;  . AMPUTATION Right 07/23/2017   Procedure: RIGHT 4TH TOE AMPUTATION;  Surgeon: [name], MD;  Location: [place];  Service: Orthopedics;  Laterality: Right;  . AMPUTATION Right 06/15/2018   Procedure: RIGHT FOOT TRANSMETATARSAL AMPUTATION;  Surgeon: [name], MD;  Location: [place];  Service: Orthopedics;  Laterality: Right;  right  . EXCISIONAL TOTAL KNEE ARTHROPLASTY WITH ANTIBIOTIC SPACERS Right 11/23/2018   Procedure: REMOVAL OF RIGHT TOTAL KNEE ARTHROPLASTY AND PLACEMENT OF ANATOMIC ANTIBIOTIC SPACER;  Surgeon: [name], MD;  Location: [place];  Service: Orthopedics;  Laterality: Right;  . I & D EXTREMITY Right 11/18/2018   Procedure: RIGHT KNEE DEBRIDEMENT;  Surgeon: [name], MD;  Location: [place];  Service: Orthopedics;  Laterality: Right;  . JOINT REPLACEMENT    . KNEE ARTHROSCOPY Bilateral 1980's/1990's  . LUNG LOBECTOMY Left ~ 2006  . LUNG LOBECTOMY    . METATARSAL OSTEOTOMY  10/29/2011   Procedure: METATARSAL OSTEOTOMY;  Surgeon: [name], MD;  Location: [place];  Service: Orthopedics;  Laterality: Left;  Left 1st Metatarsal Dorsal Closing Wedge   . METATARSAL OSTEOTOMY Right 04/28/2017   Procedure: RIGHT 1ST METATARSAL DORSAL CLOSING WEDGE OSTEOTOMY;  Surgeon: [name], MD;  Location: [place];  Service: Orthopedics;  Laterality: Right;  . MULTIPLE TOOTH EXTRACTIONS    . REVISION TOTAL HIP ARTHROPLASTY Right 2008   "4-5 months after replacement" (04/21/2012)  . TOTAL HIP ARTHROPLASTY Right 2008  . TOTAL KNEE ARTHROPLASTY Left 2006  . TOTAL KNEE ARTHROPLASTY Right 04/20/2012  . TOTAL KNEE ARTHROPLASTY Right 04/20/2012   Procedure: TOTAL KNEE ARTHROPLASTY;  Surgeon: [name], MD;  Location: [place];  Service: Orthopedics;  Laterality: Right;  Right Total Knee Arthroplasty  . TOTAL KNEE REVISION Right 03/22/2019   Procedure: RIGHT TOTAL KNEE ARTHROPLASTY REVISION OF ALL COMPONENTS;  Surgeon: [name], MD;  Location: [place];  Service: Orthopedics;  Laterality: Right;       Family History  Problem Relation Age of Onset  . Diabetes Father     Social History   Tobacco Use  . Smoking status: Current Every Day Smoker    Packs/day: 0.50    Years: 30.00    Pack years: 15.00    Types: Cigarettes  . Smokeless tobacco: Never Used  . Tobacco comment: thinking about quitting, talking with PCP  Vaping Use  . Vaping Use: Former  Substance Use Topics  . Alcohol use: Not Currently    Alcohol/week: 0.0 standard drinks  . Drug use: Not Currently  Types: Cocaine    Comment: Cocaine and heroin - 10 years ago    Home Medications Prior to Admission medications   Medication Sig Start Date End Date Taking? Authorizing Provider  acetaminophen (TYLENOL) 325 MG tablet Take 1-2 tablets (325-650 mg total) by mouth every 6 (six) hours as needed for mild pain (pain score 1-3 or temp > 100.5). 03/24/19   Persons, Bevely Palmer, [place]  aspirin EC 325 MG EC tablet Take 1 tablet (325 mg total) by mouth daily with breakfast. 03/24/19   Persons, Bevely Palmer, PA  carvedilol (COREG) 25 MG tablet Take 1 tablet (25 mg total) by mouth 2 (two) times daily with a meal. 12/01/18   Charolotte Capuchin, MD  feeding supplement, GLUCERNA SHAKE, (GLUCERNA SHAKE) LIQD Take 237 mLs by mouth 3 (three) times daily between meals. 03/24/19   Persons, Bevely Palmer, PA  gabapentin (NEURONTIN) 300 MG capsule Take 1 capsule (300 mg total) by mouth 3 (three) times daily. 03/24/19   Persons, Bevely Palmer, PA  ibuprofen (ADVIL) 200 MG tablet Take 200 mg by mouth every 6 (six) hours as needed.    [provider]  metFORMIN (GLUCOPHAGE) 500 MG tablet Take 2 tablets (1,000 mg total) by mouth 2 (two) times daily with a meal. Patient taking differently: Take 500 mg by mouth 2 (two) times daily with a meal.  06/17/18   Kayleen Memos, DO  nitroGLYCERIN (NITRODUR - DOSED IN MG/24 HR) 0.2 mg/hr patch Place 1 patch (0.2 mg total) onto the skin daily. 07/14/18    Rayburn, Neta Mends, PA-C  oxyCODONE 10 MG TABS Take 1-1.5 tablets (10-15 mg total) by mouth every 4 (four) hours as needed for severe pain (pain score 7-10). 03/24/19   Persons, Bevely Palmer, PA  polyethylene glycol (MIRALAX / GLYCOLAX) 17 g packet Take 17 g by mouth daily as needed for mild constipation. 06/17/18   Kayleen Memos, DO  rosuvastatin (CRESTOR) 20 MG tablet Take 1 tablet (20 mg total) by mouth daily. 06/17/18   Kayleen Memos, DO  silver sulfADIAZINE (SILVADENE) 1 % cream Apply 1 application topically daily. 11/03/18   [name], MD    Allergies    Benadryl [diphenhydramine hcl] and Trazodone and nefazodone  Review of Systems   Review of Systems  All other systems reviewed and are negative.   Physical Exam Updated Vital Signs BP 140/88 (BP Location: Right Arm)   Pulse 92   Temp 97.6 F (36.4 C) (Oral)   Resp (!) 27   Ht 6\' 3"  (1.905 m)   Wt 81.6 kg   SpO2 100%   BMI 22.50 kg/m   Physical Exam Vitals and nursing note reviewed.  Constitutional:      Comments: Appears intoxicated, he is unable to lie still, he keeps moving his legs.  HENT:     Head: Normocephalic.     Comments: He states he has some pain and swelling behind his left ear I am not able to appreciate it.    Right Ear: External ear normal.     Left Ear: External ear normal.     Nose: Nose normal.     Mouth/Throat:     Mouth: Mucous membranes are moist.     Pharynx: No oropharyngeal exudate or posterior oropharyngeal erythema.  Eyes:     Extraocular Movements: Extraocular movements intact.     Conjunctiva/sclera: Conjunctivae normal.     Pupils: Pupils are equal, round, and reactive to light.  Cardiovascular:  Rate and Rhythm: Normal rate and regular rhythm.     Pulses: Normal pulses.     Heart sounds: No murmur heard.   Pulmonary:     Effort: Pulmonary effort is normal. No respiratory distress.     Breath sounds: Normal breath sounds.  Chest:     Chest wall: No tenderness.    Abdominal:     General: Abdomen is flat. Bowel sounds are normal.     Palpations: Abdomen is soft.  Musculoskeletal:        General: Normal range of motion.     Cervical back: Normal range of motion.     Comments: Patient is noted to move his legs freely, he flexes his knees and hips on both sides without difficulty.  Skin:    General: Skin is warm and dry.     Capillary Refill: Capillary refill takes less than 2 seconds.     Findings: Bruising present.     Comments: Patient has a very large bruise over his left hip area.  I do not see any bruising on his right hip.  Neurological:     General: No focal deficit present.     Mental Status: He is alert and oriented to person, place, and time.     Cranial Nerves: No cranial nerve deficit.  Psychiatric:        Mood and Affect: Mood normal. Affect is labile.        Speech: Speech is slurred.      ED Results / Procedures / Treatments   Labs (all labs ordered are listed, but only abnormal results are displayed) Results for orders placed or performed during the hospital encounter of 07/15/19  Comprehensive metabolic panel  Result Value Ref Range   Sodium 125 (L) 135 - 145 mmol/L   Potassium 3.7 3.5 - 5.1 mmol/L   Chloride 94 (L) 98 - 111 mmol/L   CO2 18 (L) 22 - 32 mmol/L   Glucose, Bld 96 70 - 99 mg/dL   BUN 12 6 - 20 mg/dL   Creatinine, Ser 0.62 0.61 - 1.24 mg/dL   Calcium 8.1 (L) 8.9 - 10.3 mg/dL   Total Protein 6.5 6.5 - 8.1 g/dL   Albumin 3.7 3.5 - 5.0 g/dL   AST 34 15 - 41 U/L   ALT 18 0 - 44 U/L   Alkaline Phosphatase 81 38 - 126 U/L   Total Bilirubin 0.4 0.3 - 1.2 mg/dL   GFR calc non Af Amer >60 >60 mL/min   GFR calc Af Amer >60 >60 mL/min   Anion gap 13 5 - 15  Ethanol  Result Value Ref Range   Alcohol, Ethyl (B) 199 (H) <10 mg/dL  CBC with Differential  Result Value Ref Range   WBC 8.7 4.0 - 10.5 K/uL   RBC 4.74 4.22 - 5.81 MIL/uL   Hemoglobin 10.4 (L) 13.0 - 17.0 g/dL   HCT 33.5 (L) 39 - 52 %   MCV 70.7 (L)  80.0 - 100.0 fL   MCH 21.9 (L) 26.0 - 34.0 pg   MCHC 31.0 30.0 - 36.0 g/dL   RDW 19.3 (H) 11.5 - 15.5 %   Platelets 280 150 - 400 K/uL   nRBC 0.0 0.0 - 0.2 %   Neutrophils Relative % 61 %   Neutro Abs 5.4 1.7 - 7.7 K/uL   Lymphocytes Relative 29 %   Lymphs Abs 2.5 0.7 - 4.0 K/uL   Monocytes Relative 7 %   Monocytes Absolute 0.6  0 - 1 K/uL   Eosinophils Relative 1 %   Eosinophils Absolute 0.1 0 - 0 K/uL   Basophils Relative 1 %   Basophils Absolute 0.1 0 - 0 K/uL   Immature Granulocytes 1 %   Abs Immature Granulocytes 0.04 0.00 - 0.07 K/uL  Urine rapid drug screen (hosp performed)  Result Value Ref Range   Opiates NONE DETECTED NONE DETECTED   Cocaine POSITIVE (A) NONE DETECTED   Benzodiazepines NONE DETECTED NONE DETECTED   Amphetamines NONE DETECTED NONE DETECTED   Tetrahydrocannabinol NONE DETECTED NONE DETECTED   Barbiturates NONE DETECTED NONE DETECTED  Basic metabolic panel  Result Value Ref Range   Sodium 130 (L) 135 - 145 mmol/L   Potassium 3.5 3.5 - 5.1 mmol/L   Chloride 97 (L) 98 - 111 mmol/L   CO2 18 (L) 22 - 32 mmol/L   Glucose, Bld 126 (H) 70 - 99 mg/dL   BUN 12 6 - 20 mg/dL   Creatinine, Ser 0.65 0.61 - 1.24 mg/dL   Calcium 8.4 (L) 8.9 - 10.3 mg/dL   GFR calc non Af Amer >60 >60 mL/min   GFR calc Af Amer >60 >60 mL/min   Anion gap 15 5 - 15   Laboratory interpretation all normal except positive urine drug screen for cocaine, hyponatremia, low chloride, borderline low bicarb with normal anion gap, alcohol intoxication    EKG EKG Interpretation  Date/Time:  Saturday July 15 2019 02:06:43 EDT Ventricular Rate:  89 PR Interval:    QRS Duration: 101 QT Interval:  384 QTC Calculation: 468 R Axis:   64 Text Interpretation: Sinus rhythm Low voltage, extremity leads Baseline wander in lead(s) V6 Confirmed by Rolland Porter [phone]) on 07/15/2019 2:14:19 AM   EKG Interpretation  Date/Time:  Saturday July 15 2019 06:06:45 EDT Ventricular Rate:  99 PR  Interval:    QRS Duration: 99 QT Interval:  379 QTC Calculation: 487 R Axis:   63 Text Interpretation: Sinus rhythm Low voltage, extremity leads Borderline prolonged QT interval No significant change since last tracing EARLIER SAME DATE Confirmed by Rolland Porter [phone]) on 07/15/2019 6:57:20 AM       Radiology CT Head Wo Contrast  CT Cervical Spine Wo Contrast  Result Date: 07/15/2019 CLINICAL DATA:  Fall EXAM: CT HEAD WITHOUT CONTRAST CT CERVICAL SPINE WITHOUT CONTRAST TECHNIQUE: Multidetector CT imaging of the head and cervical spine was performed following the standard protocol without intravenous contrast. Multiplanar CT image reconstructions of the cervical spine were also generated. COMPARISON:  None. FINDINGS: CT HEAD FINDINGS Brain: There is no mass, hemorrhage or extra-axial collection. The size and configuration of the ventricles and extra-axial CSF spaces are normal. The brain parenchyma is normal, without evidence of acute or chronic infarction. Vascular: No abnormal hyperdensity of the major intracranial arteries or dural venous sinuses. No intracranial atherosclerosis. Skull: The visualized skull base, calvarium and extracranial soft tissues are normal. Sinuses/Orbits: No fluid levels or advanced mucosal thickening of the visualized paranasal sinuses. No mastoid or middle ear effusion. The orbits are normal. CT CERVICAL SPINE FINDINGS Alignment: No static subluxation. Facets are aligned. Occipital condyles are normally positioned. Skull base and vertebrae: No acute fracture. Soft tissues and spinal canal: No prevertebral fluid or swelling. No visible canal hematoma. Disc levels: Moderate spinal canal stenosis at C3-4 and C5-6. Upper chest: No pneumothorax, pulmonary nodule or pleural effusion. Other: Normal visualized paraspinal cervical soft tissues. IMPRESSION: 1. No acute intracranial abnormality. 2. No acute fracture or static subluxation of the  cervical spine. 3. Moderate spinal canal  stenosis at C3-4 and C5-6. Electronically Signed   By: Ulyses Jarred M.D.   On: 07/15/2019 05:04   DG HIP UNILAT WITH PELVIS 2-3 VIEWS LEFT  Result Date: 07/15/2019 CLINICAL DATA:  Pain after fall. EXAM: DG HIP (WITH OR WITHOUT PELVIS) 2-3V LEFT COMPARISON:  None. FINDINGS: The patient is status post right hip replacement. Visualized hardware is in good position. Moderate degenerative changes are seen in the left hip. There is some loss of joint space and osteophytosis. No acute fractures are seen. IMPRESSION: No acute fractures identified. Moderate degenerative changes in the left hip. Electronically Signed   By: Dorise Bullion III M.D   On: 07/15/2019 04:42    Procedures Procedures (including critical care time)  Medications Ordered in ED Medications  ibuprofen (ADVIL) tablet 600 mg (has no administration in time range)  acetaminophen (TYLENOL) tablet 650 mg (has no administration in time range)  sodium chloride 0.9 % bolus 1,000 mL (0 mLs Intravenous Stopped 07/15/19 0514)  sodium chloride 0.9 % bolus 1,000 mL (0 mLs Intravenous Stopped 07/15/19 7989)    ED Course  I have reviewed the triage vital signs and the nursing notes.  Pertinent labs & imaging results that were available during my care of the patient were reviewed by me and considered in my medical decision making (see chart for details).    MDM Rules/Calculators/A&P                          Patient is demanding something to drink and pain medicine for "pain all over".  I told him I could not give him for anything for pain at this moment.  Patient is extremely intoxicated.  At which point he told me he was going to call 911 and have EMS come get him.  He was given IV fluids for his complaints of being thirsty.  Recheck 5:35 AM I gave patient the results of his x-ray studies.  He is again requesting pain medication.  He does admit to doing the cocaine.  He states he had been sober for several years but he relapsed with that also  when he started drinking.  I explained with him that I could not give him pain medication.  I cannot trust him, he has been drinking, using cocaine, and is actually prescribed oxycodone which I assume he is probably ran out of early.  Review of the [place] shows he has an overdose score of 500.  He got #120 oxycodone 10 mg tablets on June 3.  These were prescribed by his primary care doctor.  He gets them monthly.  In addition he gets small amounts of oxycodone from other providers.  He has had 41 prescriptions for narcotics in the past 2 years from 7 prescribers and 5 pharmacies. I explained to the patient his sodium was little bit low, he is getting his second liter of normal saline after which I will recheck his sodium again and once it is improved he can be discharged home.  He was given an ice pack for the contusion on his hip.  6:00 AM nurse states patient was complaining of his heart racing.  She states his heart rate was around now 100, repeat EKG was done.   Patient's repeat bmet after his IV fluids shows his hyponatremia has improved.  He was discharged home.  Final Clinical Impression(s) / ED Diagnoses Final diagnoses:  Alcoholic intoxication without  complication ([place])  Cocaine abuse ([place])  Hyponatremia    Rx / DC Orders ED Discharge Orders    None     Plan discharge  Rolland Porter, MD, Barbette Or, MD 07/15/19 [phone]

## 2019-07-15 NOTE — ED Notes (Signed)
Pt removed heart monitor leads and attempting to get out of bed. Pt spoken to multiple times about his safety and staying in bed. Pt very unsteady on his feet and continues to get out of bed despite encouragement not to. Pt stating he is going to call [facility] to come get him or put his shoes on and leave.

## 2019-07-15 NOTE — ED Notes (Signed)
Patient complained of heart racing , vitals reassessed within normal limits ekg obtained and given to Dr. Tomi Bamberger.

## 2019-07-15 NOTE — ED Notes (Signed)
Patient pulled pants down to expose left hip. Patient has a large bruise to area.

## 2019-08-09 ENCOUNTER — Ambulatory Visit (INDEPENDENT_AMBULATORY_CARE_PROVIDER_SITE_OTHER): Payer: Medicare Other | Admitting: Physician Assistant

## 2019-08-09 ENCOUNTER — Encounter: Payer: Self-pay | Admitting: Physician Assistant

## 2019-08-09 ENCOUNTER — Other Ambulatory Visit: Payer: Self-pay

## 2019-08-09 VITALS — Ht 75.0 in | Wt 180.0 lb

## 2019-08-09 DIAGNOSIS — L97511 Non-pressure chronic ulcer of other part of right foot limited to breakdown of skin: Secondary | ICD-10-CM | POA: Diagnosis not present

## 2019-08-09 NOTE — Progress Notes (Signed)
Office Visit Note   Patient: [name]           Date of Birth: February 14, 1965           MRN: [id number] Visit Date: 08/09/2019              Requested by: Lucia Gaskins, MD [address] [place],  [place] [address] PCP: Lucia Gaskins, MD  Chief Complaint  Patient presents with  . Right Foot - Follow-up    Ulcer check   . Left Foot - Follow-up      HPI: This is a pleasant gentleman who is status post right total knee arthroplasty revision after he had an infection.  He is overall doing really well.  He is not complaining of pain.  He does have a little more warmth in the right knee than the left knee.  He gets a little more swelling when he is bent on his leg for a long period of time.  He also comes in for trimming of calluses on the stump of his right transmetatarsal amputation and on the plantar surface of his first MTP joint on the left he feels these 2 are getting better  Assessment & Plan: Visit Diagnoses: No diagnosis found.  Plan: Follow-up in 1 month sooner if he has any problems should continue wearing doughnuts and appropriate shoes I emphasized the importance of Achilles stretching  Follow-Up Instructions: No follow-ups on file.   Ortho Exam  Patient is alert, oriented, no adenopathy, well-dressed, normal affect, normal respiratory effort. Right knee well-healed surgical incision full range of motion for knee replacement.  No effusion no cellulitis no tenderness to palpation slightly warmer when palpating right and left knees right transmetatarsal amputation stump callus is almost completely healed this was trimmed down to a soft surface.  Callus and ulcer on left plantar surface of first MTP.  Healthy viable tissue is now at skin level debrided some of the macerated tissue surrounding this.  Band-Aids and mupirocin were applied to both.  No signs of cellulitis no erythema no  Imaging: No results found. No images are attached to the encounter.  Labs: Lab  Results  Component Value Date   HGBA1C 6.4 (H) 03/20/2019   HGBA1C 6.4 (H) 10/31/2018   HGBA1C 6.2 (H) 06/13/2018   ESRSEDRATE 20 (H) 01/01/2019   ESRSEDRATE 120 (H) 11/16/2018   ESRSEDRATE 30 (H) 06/13/2018   CRP 1.0 (H) 01/01/2019   CRP 36 (H) 11/16/2018   CRP 5.7 (H) 06/13/2018   REPTSTATUS 11/28/2018 FINAL 11/23/2018   GRAMSTAIN  11/23/2018    FEW WBC PRESENT, PREDOMINANTLY PMN NO ORGANISMS SEEN    CULT  11/23/2018    RARE STAPHYLOCOCCUS AUREUS CRITICAL RESULT CALLED TO, READ BACK BY AND VERIFIED WITH: Deno Lunger RN, AT 1053 11/25/18 BY D. VANHOOK REGARDING CULTURE GROWTH NO ANAEROBES ISOLATED Performed at [facility], [place] [address], [place], [place] [address]    LABORGA STAPHYLOCOCCUS AUREUS 11/23/2018     Lab Results  Component Value Date   ALBUMIN 3.7 07/15/2019   ALBUMIN 4.0 03/20/2019   ALBUMIN 2.1 (L) 11/26/2018    Lab Results  Component Value Date   MG 1.8 11/28/2018   MG 1.7 11/27/2018   MG 2.0 11/26/2018   No results found for: VD25OH  No results found for: PREALBUMIN CBC EXTENDED Latest Ref Rng & Units 07/15/2019 03/22/2019 03/20/2019  WBC 4.0 - 10.5 K/uL 8.7 - 11.1(H)  RBC 4.22 - 5.81 MIL/uL 4.74 - 5.48  HGB 13.0 - 17.0 g/dL 10.4(L) 9.3(L) 14.3  HCT 39 - 52 % 33.5(L) 30.0(L) 45.9  PLT 150 - 400 K/uL 280 - 279  NEUTROABS 1.7 - 7.7 K/uL 5.4 - -  LYMPHSABS 0.7 - 4.0 K/uL 2.5 - -     Body mass index is 22.5 kg/m.  Orders:  No orders of the defined types were placed in this encounter.  No orders of the defined types were placed in this encounter.    Procedures: No procedures performed  Clinical Data: No additional findings.  ROS:  All other systems negative, except as noted in the HPI. Review of Systems  Objective: Vital Signs: Ht 6\' 3"  (1.905 m)   Wt 180 lb (81.6 kg)   BMI 22.50 kg/m   Specialty Comments:  No specialty comments available.  PMFS History: Patient Active Problem List   Diagnosis Date Noted  . Malnutrition  of moderate degree 03/25/2019  . 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 PICC (peripherally inserted central catheter) in place 12/21/2018  . Medication monitoring encounter 12/21/2018  . Staphylococcus aureus bacteremia   . 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 Infected prosthetic knee joint, initial encounter ([place])   . Septic joint of right knee joint ([place]) 11/16/2018  . Hypokalemia 11/16/2018  . Anemia 11/16/2018  . Acute encephalopathy 10/31/2018  . Accidental drug overdose 10/31/2018  . Left arm weakness 10/31/2018  . History of transmetatarsal amputation of right foot ([place]) 08/30/2018  . Charcot foot due to diabetes mellitus ([place])   . Cellulitis 06/12/2018  . Diabetic foot infection ([place]) 06/22/2017  . Post op infection 06/22/2017  . SVT (supraventricular tachycardia) ([place]) 08/17/2016  . Tobacco use disorder 10/25/2014  . Diabetes mellitus type 2 in nonobese (HCC)   . Swelling   . Type 2 diabetes mellitus with left diabetic foot ulcer ([place]) 12/18/2013  . Diabetes mellitus due to underlying condition with foot ulcer ([place]) 12/18/2013  . Left leg cellulitis 12/18/2013  . Cocaine abuse ([place]) 03/29/2012  . Generalized anxiety disorder 03/29/2012  . Panic attacks 03/29/2012  . Alcohol dependence ([place]) 08/05/2011    Class: Chronic  . Substance abuse ([place]) 05/31/2011  . Ulcer of left foot, limited to breakdown of skin ([place]) 02/15/2011   Past Medical History:  Diagnosis Date  . Anemia    as a child  . Anxiety   . Arthritis    "everywhere" (04/21/2012)  . Cellulitis and abscess of foot 12/19/2013   LEFT FOOT  . Cellulitis of right foot   . Chronic pain   . Constipation   . DDD (degenerative disc disease)   . Depression    pt denies  . Diabetes mellitus without complication (HCC)    borderline  . Diabetic foot ulcer ([place])   . ETOH abuse   . Full dentures   . GERD (gastroesophageal reflux disease)    tums  . H/O amputation of lesser toe, right  ([place]) 07/29/2017  . History of blood transfusion    "related to left knee OR; probably right hip too" (04/21/2012)  . History of kidney stones   . Mental disorder   . MRSA (methicillin resistant staph aureus) culture positive   . Neuromuscular disorder (HCC)    neuropathy  . Noncompliance   . Open wound    bottom of foot  . Osteomyelitis ([place])    right 4th toe  . Osteomyelitis of right foot ([place])   . Pneumonia ~ 2012  . Polysubstance abuse (  [place])    etoh, cocaine, heroin  . Stroke [facility]) 2008   "they said I might have had one during right hip replacement" (04/21/2012)  . Wears glasses     Family History  Problem Relation Age of Onset  . Diabetes Father     Past Surgical History:  Procedure Laterality Date  . AMPUTATION Right 06/09/2017   Procedure: RIGHT 2ND TOE AMPUTATION;  Surgeon: [name], MD;  Location: [facility];  Service: Orthopedics;  Laterality: Right;  . AMPUTATION Right 07/23/2017   Procedure: RIGHT 4TH TOE AMPUTATION;  Surgeon: [name], MD;  Location: [facility];  Service: Orthopedics;  Laterality: Right;  . AMPUTATION Right 06/15/2018   Procedure: RIGHT FOOT TRANSMETATARSAL AMPUTATION;  Surgeon: [name], MD;  Location: [facility];  Service: Orthopedics;  Laterality: Right;  right  . EXCISIONAL TOTAL KNEE ARTHROPLASTY WITH ANTIBIOTIC SPACERS Right 11/23/2018   Procedure: REMOVAL OF RIGHT TOTAL KNEE ARTHROPLASTY AND PLACEMENT OF ANATOMIC ANTIBIOTIC SPACER;  Surgeon: [name], MD;  Location: [facility];  Service: Orthopedics;  Laterality: Right;  . I & D EXTREMITY Right 11/18/2018   Procedure: RIGHT KNEE DEBRIDEMENT;  Surgeon: [name], MD;  Location: [facility];  Service: Orthopedics;  Laterality: Right;  . JOINT REPLACEMENT    . KNEE ARTHROSCOPY Bilateral 1980's/1990's  . LUNG LOBECTOMY Left ~ 2006  . LUNG LOBECTOMY    . METATARSAL OSTEOTOMY  10/29/2011   Procedure: METATARSAL OSTEOTOMY;  Surgeon: [name], MD;  Location: [facility];  Service: Orthopedics;   Laterality: Left;  Left 1st Metatarsal Dorsal Closing Wedge   . METATARSAL OSTEOTOMY Right 04/28/2017   Procedure: RIGHT 1ST METATARSAL DORSAL CLOSING WEDGE OSTEOTOMY;  Surgeon: [name], MD;  Location: [facility];  Service: Orthopedics;  Laterality: Right;  . MULTIPLE TOOTH EXTRACTIONS    . REVISION TOTAL HIP ARTHROPLASTY Right 2008   "4-5 months after replacement" (04/21/2012)  . TOTAL HIP ARTHROPLASTY Right 2008  . TOTAL KNEE ARTHROPLASTY Left 2006  . TOTAL KNEE ARTHROPLASTY Right 04/20/2012  . TOTAL KNEE ARTHROPLASTY Right 04/20/2012   Procedure: TOTAL KNEE ARTHROPLASTY;  Surgeon: [name], MD;  Location: [facility];  Service: Orthopedics;  Laterality: Right;  Right Total Knee Arthroplasty  . TOTAL KNEE REVISION Right 03/22/2019   Procedure: RIGHT TOTAL KNEE ARTHROPLASTY REVISION OF ALL COMPONENTS;  Surgeon: [name], MD;  Location: [facility];  Service: Orthopedics;  Laterality: Right;   Social History   Occupational History  . Not on file  Tobacco Use  . Smoking status: Current Every Day Smoker    Packs/day: 0.50    Years: 30.00    Pack years: 15.00    Types: Cigarettes  . Smokeless tobacco: Never Used  . Tobacco comment: thinking about quitting, talking with PCP  Vaping Use  . Vaping Use: Former  Substance and Sexual Activity  . Alcohol use: Not Currently    Alcohol/week: 0.0 standard drinks  . Drug use: Not Currently    Types: Cocaine    Comment: Cocaine and heroin - 10 years ago  . Sexual activity: Yes    Birth control/protection: None

## 2019-09-07 ENCOUNTER — Encounter: Payer: Self-pay | Admitting: Orthopedic Surgery

## 2019-09-07 ENCOUNTER — Ambulatory Visit (INDEPENDENT_AMBULATORY_CARE_PROVIDER_SITE_OTHER): Payer: Medicare Other | Admitting: Orthopedic Surgery

## 2019-09-07 ENCOUNTER — Ambulatory Visit (INDEPENDENT_AMBULATORY_CARE_PROVIDER_SITE_OTHER): Payer: Medicare Other

## 2019-09-07 VITALS — Ht 75.0 in | Wt 180.0 lb

## 2019-09-07 DIAGNOSIS — Z89431 Acquired absence of right foot: Secondary | ICD-10-CM

## 2019-09-07 DIAGNOSIS — L97521 Non-pressure chronic ulcer of other part of left foot limited to breakdown of skin: Secondary | ICD-10-CM

## 2019-09-07 DIAGNOSIS — L97511 Non-pressure chronic ulcer of other part of right foot limited to breakdown of skin: Secondary | ICD-10-CM

## 2019-09-07 NOTE — Progress Notes (Signed)
Office Visit Note   Patient: [name]           Date of Birth: 12/16/1965           MRN: [id number] Visit Date: 09/07/2019              Requested by: Lucia Gaskins, MD [address] [place],  [place] [address] PCP: Lucia Gaskins, MD  Chief Complaint  Patient presents with  . Right Knee - Follow-up    03/22/19 total knee arthroplasty revision       HPI: Patient is a 54 year old gentleman who presents complaining of a Wagner grade 1 ulcer beneath the left and right foot status post a right foot transmetatarsal amputation.  Patient is also status post a right total knee arthroplasty revision in March he states he had a hyperextension injury at home and has increased warmth in the right knee.  Assessment & Plan: Visit Diagnoses:  1. History of transmetatarsal amputation of right foot ([place])   2. Non-pressure chronic ulcer of other part of right foot limited to breakdown of skin ([place])   3. Ulcer of left foot, limited to breakdown of skin ([place])     Plan: Patient's right knee is functioning well he will continue with strengthening.  The 2 Wagner grade 1 ulcers were debrided back to healthy viable granulation tissue continue with Band-Aid and pressure unloading.  Follow-Up Instructions: Return in about 4 weeks (around 10/05/2019).   Ortho Exam  Patient is alert, oriented, no adenopathy, well-dressed, normal affect, normal respiratory effort. Examination patient's right knee after an acute hyperextension sprain he has full active extension and full flexion there is no effusion no ligamentous instability no redness no cellulitis.  Examination of both feet he has a Wagner grade 1 ulcer beneath the first metatarsal head of the left foot and midfoot on the right foot.  There is no redness no cellulitis no odor no drainage.  After informed consent and sterile prepping the left great toe ulcer was debrided of skin and soft tissue and touch with silver nitrate for hemostasis.  Ulcer was  10 mm in diameter before debridement 3 cm in diameter after debridement and 3 mm deep.  Right foot ulcer is 1 cm in diameter prior to debridement after debridement the right foot ulcer is 3 cm in diameter and 2 mm deep.  Both ulcers were touched with silver nitrate no exposed bone or tendon.  Imaging: XR Foot 2 Views Right  Result Date: 09/07/2019 2 view radiographs of the right foot shows stable internal fixation of the base of the first metatarsal and no lytic changes beneath the transmetatarsal amputation  No images are attached to the encounter.  Labs: Lab Results  Component Value Date   HGBA1C 6.4 (H) 03/20/2019   HGBA1C 6.4 (H) 10/31/2018   HGBA1C 6.2 (H) 06/13/2018   ESRSEDRATE 20 (H) 01/01/2019   ESRSEDRATE 120 (H) 11/16/2018   ESRSEDRATE 30 (H) 06/13/2018   CRP 1.0 (H) 01/01/2019   CRP 36 (H) 11/16/2018   CRP 5.7 (H) 06/13/2018   REPTSTATUS 11/28/2018 FINAL 11/23/2018   GRAMSTAIN  11/23/2018    FEW WBC PRESENT, PREDOMINANTLY PMN NO ORGANISMS SEEN    CULT  11/23/2018    RARE STAPHYLOCOCCUS AUREUS CRITICAL RESULT CALLED TO, READ BACK BY AND VERIFIED WITH: Deno Lunger RN, AT 1053 11/25/18 BY D. VANHOOK REGARDING CULTURE GROWTH NO ANAEROBES ISOLATED Performed at [facility], [place] [address] [place], [place] [address]    Doy Hutching  STAPHYLOCOCCUS AUREUS 11/23/2018     Lab Results  Component Value Date   ALBUMIN 3.7 07/15/2019   ALBUMIN 4.0 03/20/2019   ALBUMIN 2.1 (L) 11/26/2018    Lab Results  Component Value Date   MG 1.8 11/28/2018   MG 1.7 11/27/2018   MG 2.0 11/26/2018   No results found for: VD25OH  No results found for: PREALBUMIN CBC EXTENDED Latest Ref Rng & Units 07/15/2019 03/22/2019 03/20/2019  WBC 4.0 - 10.5 K/uL 8.7 - 11.1(H)  RBC 4.22 - 5.81 MIL/uL 4.74 - 5.48  HGB 13.0 - 17.0 g/dL 10.4(L) 9.3(L) 14.3  HCT 39 - 52 % 33.5(L) 30.0(L) 45.9  PLT 150 - 400 K/uL 280 - 279  NEUTROABS 1.7 - 7.7 K/uL 5.4 - -  LYMPHSABS 0.7 - 4.0 K/uL 2.5 - -      Body mass index is 22.5 kg/m.  Orders:  Orders Placed This Encounter  Procedures  . XR Foot 2 Views Right   No orders of the defined types were placed in this encounter.    Procedures: No procedures performed  Clinical Data: No additional findings.  ROS:  All other systems negative, except as noted in the HPI. Review of Systems  Objective: Vital Signs: Ht 6\' 3"  (1.905 m)   Wt 180 lb (81.6 kg)   BMI 22.50 kg/m   Specialty Comments:  No specialty comments available.  PMFS History: Patient Active Problem List   Diagnosis Date Noted  . Malnutrition of moderate degree 03/25/2019  . 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 PICC (peripherally inserted central catheter) in place 12/21/2018  . Medication monitoring encounter 12/21/2018  . Staphylococcus aureus bacteremia   . 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 Infected prosthetic knee joint, initial encounter ([place])   . Septic joint of right knee joint ([place]) 11/16/2018  . Hypokalemia 11/16/2018  . Anemia 11/16/2018  . Acute encephalopathy 10/31/2018  . Accidental drug overdose 10/31/2018  . Left arm weakness 10/31/2018  . History of transmetatarsal amputation of right foot ([place]) 08/30/2018  . Charcot foot due to diabetes mellitus ([place])   . Cellulitis 06/12/2018  . Diabetic foot infection ([place]) 06/22/2017  . Post op infection 06/22/2017  . SVT (supraventricular tachycardia) ([place]) 08/17/2016  . Tobacco use disorder 10/25/2014  . Diabetes mellitus type 2 in nonobese (HCC)   . Swelling   . Type 2 diabetes mellitus with left diabetic foot ulcer ([place]) 12/18/2013  . Diabetes mellitus due to underlying condition with foot ulcer ([place]) 12/18/2013  . Left leg cellulitis 12/18/2013  . Cocaine abuse ([place]) 03/29/2012  . Generalized anxiety disorder 03/29/2012  . Panic attacks 03/29/2012  . Alcohol dependence ([place]) 08/05/2011    Class: Chronic  . Substance abuse ([place]) 05/31/2011  . Ulcer of  left foot, limited to breakdown of skin ([place]) 02/15/2011   Past Medical History:  Diagnosis Date  . Anemia    as a child  . Anxiety   . Arthritis    "everywhere" (04/21/2012)  . Cellulitis and abscess of foot 12/19/2013   LEFT FOOT  . Cellulitis of right foot   . Chronic pain   . Constipation   . DDD (degenerative disc disease)   . Depression    pt denies  . Diabetes mellitus without complication (HCC)    borderline  . Diabetic foot ulcer ([place])   . ETOH abuse   . Full dentures   . GERD (gastroesophageal reflux disease)    tums  . H/O  amputation of lesser toe, right ([place]) 07/29/2017  . History of blood transfusion    "related to left knee OR; probably right hip too" (04/21/2012)  . History of kidney stones   . Mental disorder   . MRSA (methicillin resistant staph aureus) culture positive   . Neuromuscular disorder (HCC)    neuropathy  . Noncompliance   . Open wound    bottom of foot  . Osteomyelitis ([place])    right 4th toe  . Osteomyelitis of right foot ([place])   . Pneumonia ~ 2012  . Polysubstance abuse (HCC)    etoh, cocaine, heroin  . Stroke [facility]) 2008   "they said I might have had one during right hip replacement" (04/21/2012)  . Wears glasses     Family History  Problem Relation Age of Onset  . Diabetes Father     Past Surgical History:  Procedure Laterality Date  . AMPUTATION Right 06/09/2017   Procedure: RIGHT 2ND TOE AMPUTATION;  Surgeon: [name], MD;  Location: [facility];  Service: Orthopedics;  Laterality: Right;  . AMPUTATION Right 07/23/2017   Procedure: RIGHT 4TH TOE AMPUTATION;  Surgeon: [name], MD;  Location: [facility];  Service: Orthopedics;  Laterality: Right;  . AMPUTATION Right 06/15/2018   Procedure: RIGHT FOOT TRANSMETATARSAL AMPUTATION;  Surgeon: [name], MD;  Location: [place];  Service: Orthopedics;  Laterality: Right;  right  . EXCISIONAL TOTAL KNEE ARTHROPLASTY WITH ANTIBIOTIC SPACERS Right 11/23/2018   Procedure: REMOVAL OF RIGHT TOTAL  KNEE ARTHROPLASTY AND PLACEMENT OF ANATOMIC ANTIBIOTIC SPACER;  Surgeon: [name], MD;  Location: [place];  Service: Orthopedics;  Laterality: Right;  . I & D EXTREMITY Right 11/18/2018   Procedure: RIGHT KNEE DEBRIDEMENT;  Surgeon: [name], MD;  Location: [facility];  Service: Orthopedics;  Laterality: Right;  . JOINT REPLACEMENT    . KNEE ARTHROSCOPY Bilateral 1980's/1990's  . LUNG LOBECTOMY Left ~ 2006  . LUNG LOBECTOMY    . METATARSAL OSTEOTOMY  10/29/2011   Procedure: METATARSAL OSTEOTOMY;  Surgeon: [name], MD;  Location: [facility];  Service: Orthopedics;  Laterality: Left;  Left 1st Metatarsal Dorsal Closing Wedge   . METATARSAL OSTEOTOMY Right 04/28/2017   Procedure: RIGHT 1ST METATARSAL DORSAL CLOSING WEDGE OSTEOTOMY;  Surgeon: [name], MD;  Location: [facility];  Service: Orthopedics;  Laterality: Right;  . MULTIPLE TOOTH EXTRACTIONS    . REVISION TOTAL HIP ARTHROPLASTY Right 2008   "4-5 months after replacement" (04/21/2012)  . TOTAL HIP ARTHROPLASTY Right 2008  . TOTAL KNEE ARTHROPLASTY Left 2006  . TOTAL KNEE ARTHROPLASTY Right 04/20/2012  . TOTAL KNEE ARTHROPLASTY Right 04/20/2012   Procedure: TOTAL KNEE ARTHROPLASTY;  Surgeon: [name], MD;  Location: [facility];  Service: Orthopedics;  Laterality: Right;  Right Total Knee Arthroplasty  . TOTAL KNEE REVISION Right 03/22/2019   Procedure: RIGHT TOTAL KNEE ARTHROPLASTY REVISION OF ALL COMPONENTS;  Surgeon: [name], MD;  Location: [place];  Service: Orthopedics;  Laterality: Right;   Social History   Occupational History  . Not on file  Tobacco Use  . Smoking status: Current Every Day Smoker    Packs/day: 0.50    Years: 30.00    Pack years: 15.00    Types: Cigarettes  . Smokeless tobacco: Never Used  . Tobacco comment: thinking about quitting, talking with PCP  Vaping Use  . Vaping Use: Former  Substance and Sexual Activity  . Alcohol use: Not Currently  Alcohol/week: 0.0 standard drinks  . Drug use: Not  Currently    Types: Cocaine    Comment: Cocaine and heroin - 10 years ago  . Sexual activity: Yes    Birth control/protection: None

## 2019-09-21 ENCOUNTER — Encounter: Payer: Self-pay | Admitting: Internal Medicine

## 2019-09-21 IMAGING — DX DG KNEE COMPLETE 4+V*R*
4 series · 4 of 4 positions shown · non-contrast
Comparison: 08/16/2016

CLINICAL DATA: Right knee pain after fall.

EXAM:
RIGHT KNEE - COMPLETE 4+ VIEW

[knee ap (1 of 3)]
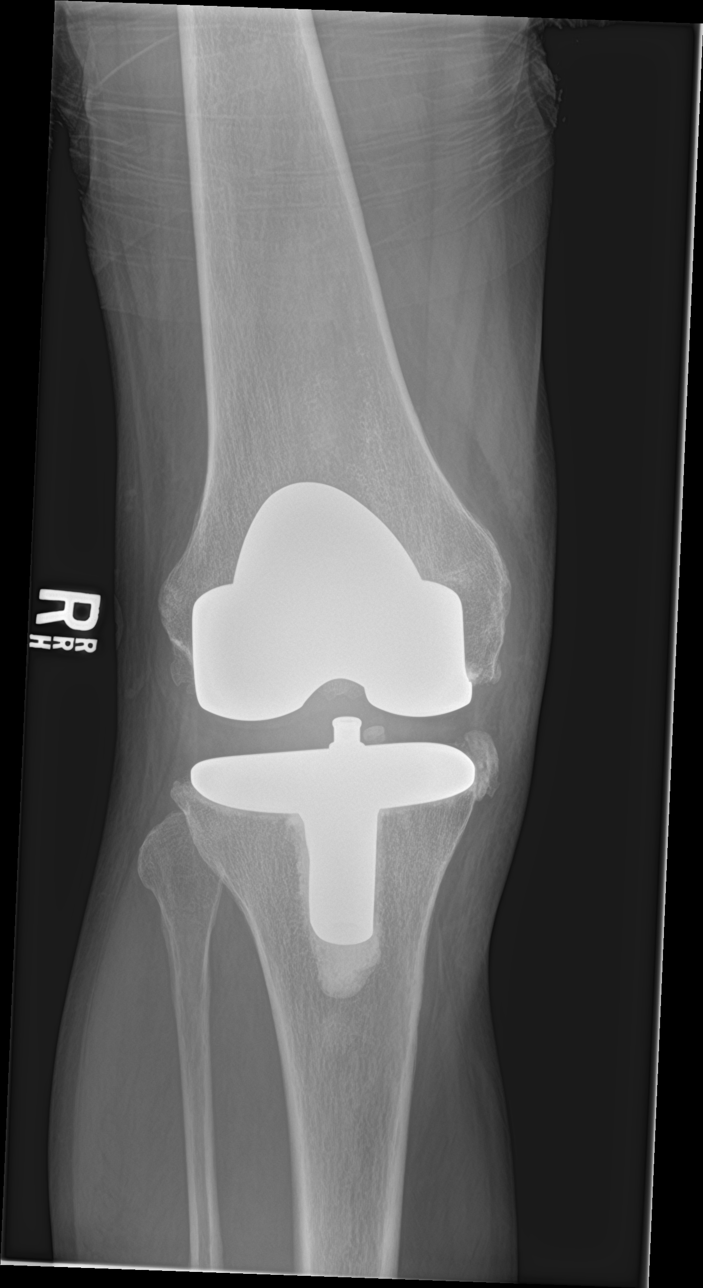

[knee ap (2 of 3)]
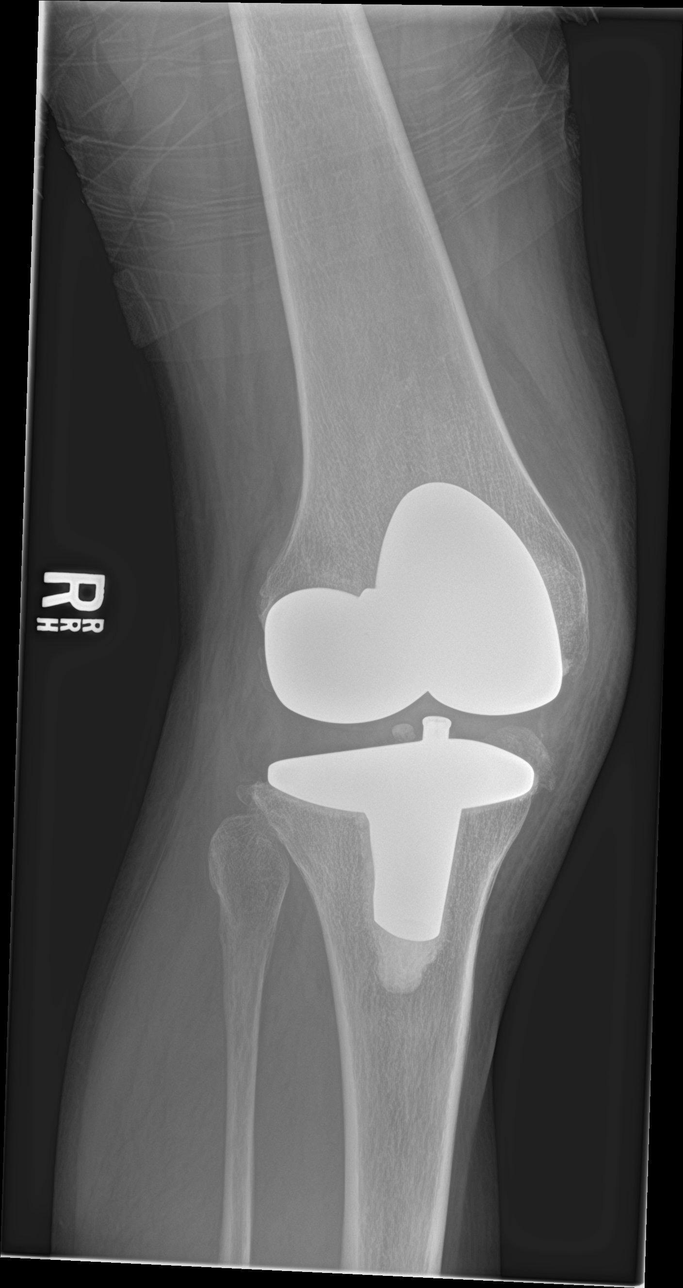

[knee ap (3 of 3)]
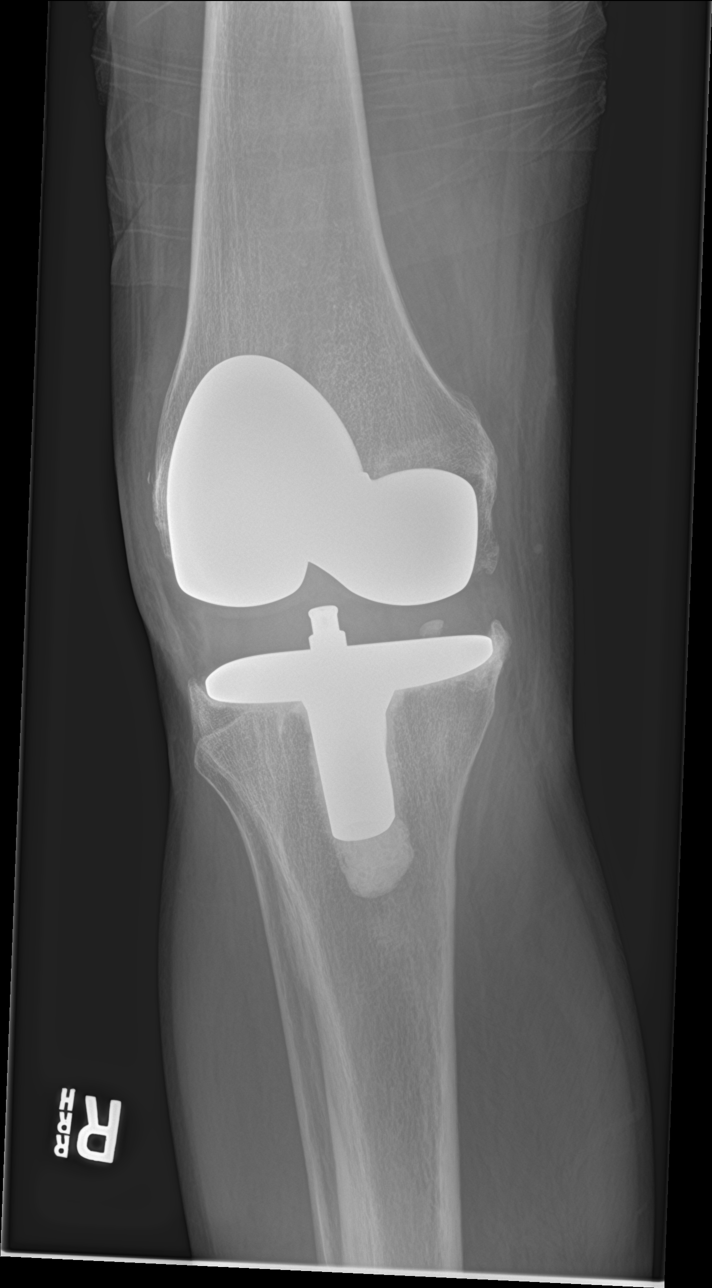

[knee lat]
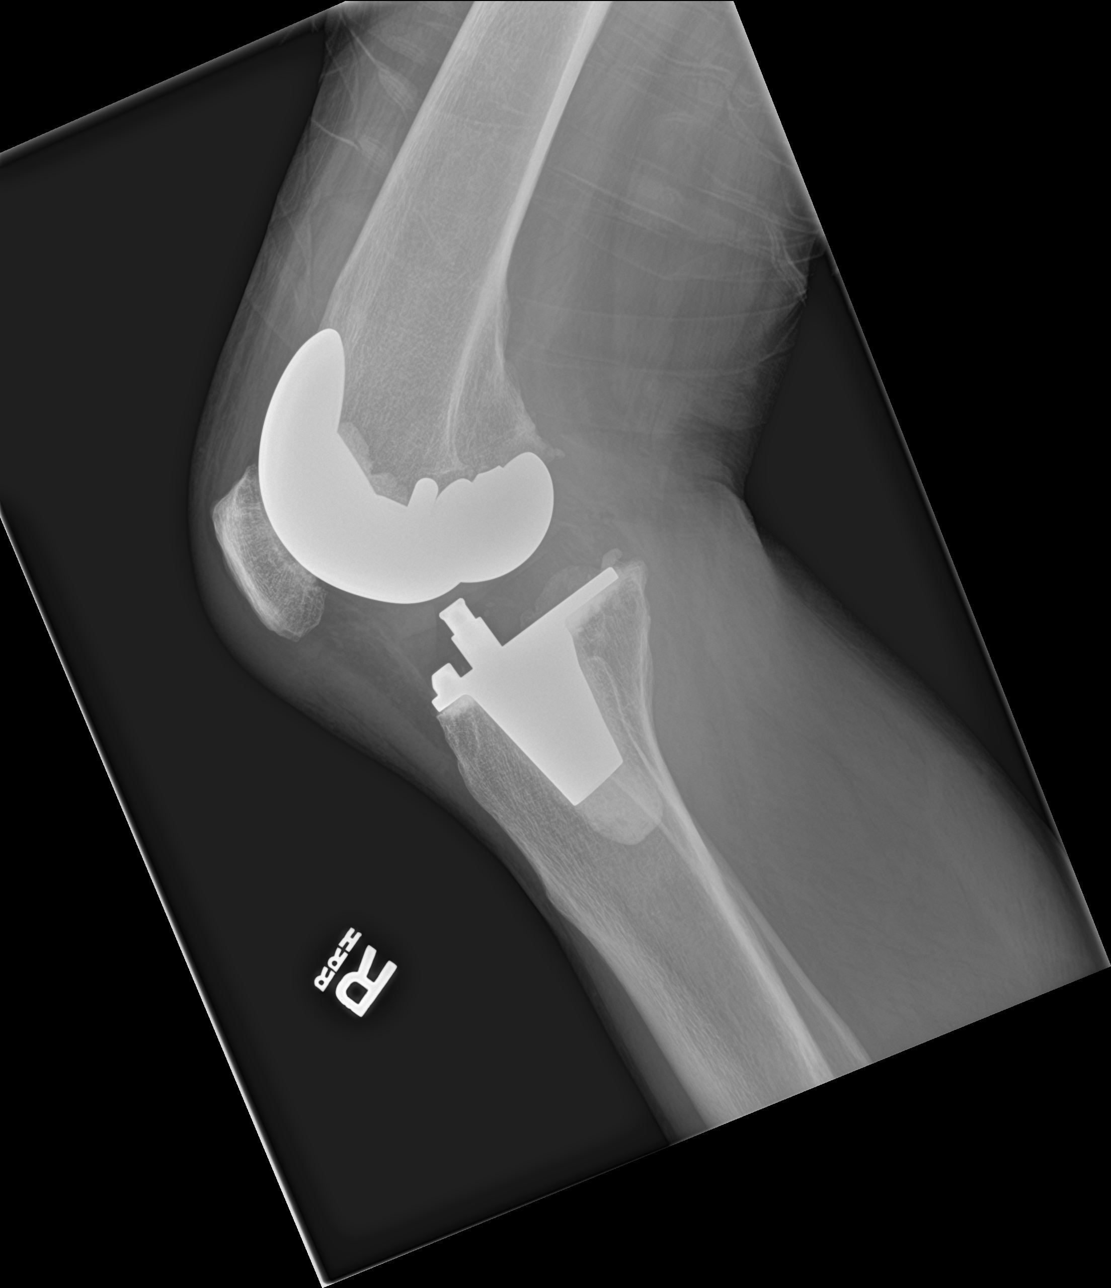

[4 of 4 positions shown; findings below may reference images not displayed]

FINDINGS: Status post tricompartmental knee replacement. No evidence for an
acute fracture. No dislocation. No evidence for joint effusion.
IMPRESSION: Status post total knee replacement without acute abnormality.

## 2019-10-05 ENCOUNTER — Ambulatory Visit (INDEPENDENT_AMBULATORY_CARE_PROVIDER_SITE_OTHER): Payer: Medicare Other | Admitting: Physician Assistant

## 2019-10-05 ENCOUNTER — Encounter: Payer: Self-pay | Admitting: Orthopedic Surgery

## 2019-10-05 ENCOUNTER — Other Ambulatory Visit: Payer: Self-pay

## 2019-10-05 VITALS — Ht 75.0 in | Wt 180.0 lb

## 2019-10-05 DIAGNOSIS — E11610 Type 2 diabetes mellitus with diabetic neuropathic arthropathy: Secondary | ICD-10-CM

## 2019-10-05 NOTE — Progress Notes (Signed)
Office Visit Note   Patient: [name]           Date of Birth: 10-09-1965           MRN: [id number] Visit Date: 10/05/2019              Requested by: Lucia Gaskins, MD [address] [place],  [place] [address] PCP: Lucia Gaskins, MD  Chief Complaint  Patient presents with  . Right Foot - Follow-up  . Left Foot - Follow-up      HPI: Is a pleasant gentleman who follows up for bilateral foot check.  On the right side he is status post transmetatarsal amputation.  He has no complaints and he feels this is completely healed.  On the left he has a ulcer beneath his first MTP joint which he is treating with daily dressing changes and Silvadene he does feel it is improving  Assessment & Plan: Visit Diagnoses: No diagnosis found.  Plan: Patient will continue on the left with an offloading shoe with a doughnut.  We will follow up in 1 month or sooner if he notices any changes.  On the right he should use cocoa butter or other moisturizing lotion  Follow-Up Instructions: No follow-ups on file.   Ortho Exam  Patient is alert, oriented, no adenopathy, well-dressed, normal affect, normal respiratory effort. Right foot: Transmetatarsal amputation stump is completely healed no erythema dry skin but no real callusing  On the left side he has a 3 cm Wagner ulcer with some surrounding callus and skin maceration.  No cellulitis no foul drainage does not probe deeply.  After verbal consent this was trimmed to healthy bleeding surfaces hemostasis was achieved Band-Aid with mupirocin was applied  Imaging: No results found. No images are attached to the encounter.  Labs: Lab Results  Component Value Date   HGBA1C 6.4 (H) 03/20/2019   HGBA1C 6.4 (H) 10/31/2018   HGBA1C 6.2 (H) 06/13/2018   ESRSEDRATE 20 (H) 01/01/2019   ESRSEDRATE 120 (H) 11/16/2018   ESRSEDRATE 30 (H) 06/13/2018   CRP 1.0 (H) 01/01/2019   CRP 36 (H) 11/16/2018   CRP 5.7 (H) 06/13/2018   REPTSTATUS 11/28/2018  FINAL 11/23/2018   GRAMSTAIN  11/23/2018    FEW WBC PRESENT, PREDOMINANTLY PMN NO ORGANISMS SEEN    CULT  11/23/2018    RARE STAPHYLOCOCCUS AUREUS CRITICAL RESULT CALLED TO, READ BACK BY AND VERIFIED WITH: Deno Lunger RN, AT 1053 11/25/18 BY D. VANHOOK REGARDING CULTURE GROWTH NO ANAEROBES ISOLATED Performed at [facility], [place] [address], [place], [place] [address]    LABORGA STAPHYLOCOCCUS AUREUS 11/23/2018     Lab Results  Component Value Date   ALBUMIN 3.7 07/15/2019   ALBUMIN 4.0 03/20/2019   ALBUMIN 2.1 (L) 11/26/2018    Lab Results  Component Value Date   MG 1.8 11/28/2018   MG 1.7 11/27/2018   MG 2.0 11/26/2018   No results found for: VD25OH  No results found for: PREALBUMIN CBC EXTENDED Latest Ref Rng & Units 07/15/2019 03/22/2019 03/20/2019  WBC 4.0 - 10.5 K/uL 8.7 - 11.1(H)  RBC 4.22 - 5.81 MIL/uL 4.74 - 5.48  HGB 13.0 - 17.0 g/dL 10.4(L) 9.3(L) 14.3  HCT 39 - 52 % 33.5(L) 30.0(L) 45.9  PLT 150 - 400 K/uL 280 - 279  NEUTROABS 1.7 - 7.7 K/uL 5.4 - -  LYMPHSABS 0.7 - 4.0 K/uL 2.5 - -     Body mass index is 22.5 kg/m.  Orders:  No  orders of the defined types were placed in this encounter.  No orders of the defined types were placed in this encounter.    Procedures: No procedures performed  Clinical Data: No additional findings.  ROS:  All other systems negative, except as noted in the HPI. Review of Systems  Objective: Vital Signs: Ht 6\' 3"  (1.905 m)   Wt 180 lb (81.6 kg)   BMI 22.50 kg/m   Specialty Comments:  No specialty comments available.  PMFS History: Patient Active Problem List   Diagnosis Date Noted  . Malnutrition of moderate degree 03/25/2019  . 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 PICC (peripherally inserted central catheter) in place 12/21/2018  . Medication monitoring encounter 12/21/2018  . Staphylococcus aureus bacteremia   . 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 Infected prosthetic  knee joint, initial encounter ([place])   . Septic joint of right knee joint ([place]) 11/16/2018  . Hypokalemia 11/16/2018  . Anemia 11/16/2018  . Acute encephalopathy 10/31/2018  . Accidental drug overdose 10/31/2018  . Left arm weakness 10/31/2018  . History of transmetatarsal amputation of right foot ([place]) 08/30/2018  . Charcot foot due to diabetes mellitus ([place])   . Cellulitis 06/12/2018  . Diabetic foot infection ([place]) 06/22/2017  . Post op infection 06/22/2017  . SVT (supraventricular tachycardia) ([place]) 08/17/2016  . Tobacco use disorder 10/25/2014  . Diabetes mellitus type 2 in nonobese (HCC)   . Swelling   . Type 2 diabetes mellitus with left diabetic foot ulcer ([place]) 12/18/2013  . Diabetes mellitus due to underlying condition with foot ulcer ([place]) 12/18/2013  . Left leg cellulitis 12/18/2013  . Cocaine abuse ([place]) 03/29/2012  . Generalized anxiety disorder 03/29/2012  . Panic attacks 03/29/2012  . Alcohol dependence ([place]) 08/05/2011    Class: Chronic  . Substance abuse ([place]) 05/31/2011  . Ulcer of left foot, limited to breakdown of skin ([place]) 02/15/2011   Past Medical History:  Diagnosis Date  . Anemia    as a child  . Anxiety   . Arthritis    "everywhere" (04/21/2012)  . Cellulitis and abscess of foot 12/19/2013   LEFT FOOT  . Cellulitis of right foot   . Chronic pain   . Constipation   . DDD (degenerative disc disease)   . Depression    pt denies  . Diabetes mellitus without complication (HCC)    borderline  . Diabetic foot ulcer ([place])   . ETOH abuse   . Full dentures   . GERD (gastroesophageal reflux disease)    tums  . H/O amputation of lesser toe, right ([place]) 07/29/2017  . History of blood transfusion    "related to left knee OR; probably right hip too" (04/21/2012)  . History of kidney stones   . Mental disorder   . MRSA (methicillin resistant staph aureus) culture positive   . Neuromuscular disorder (HCC)    neuropathy  . Noncompliance   . Open wound     bottom of foot  . Osteomyelitis ([place])    right 4th toe  . Osteomyelitis of right foot ([place])   . Pneumonia ~ 2012  . Polysubstance abuse (HCC)    etoh, cocaine, heroin  . Stroke [facility]) 2008   "they said I might have had one during right hip replacement" (04/21/2012)  . Wears glasses     Family History  Problem Relation Age of Onset  . Diabetes Father     Past Surgical History:  Procedure Laterality Date  .  AMPUTATION Right 06/09/2017   Procedure: RIGHT 2ND TOE AMPUTATION;  Surgeon: [name], MD;  Location: [place];  Service: Orthopedics;  Laterality: Right;  . AMPUTATION Right 07/23/2017   Procedure: RIGHT 4TH TOE AMPUTATION;  Surgeon: [name], MD;  Location: [place];  Service: Orthopedics;  Laterality: Right;  . AMPUTATION Right 06/15/2018   Procedure: RIGHT FOOT TRANSMETATARSAL AMPUTATION;  Surgeon: [name], MD;  Location: [place];  Service: Orthopedics;  Laterality: Right;  right  . EXCISIONAL TOTAL KNEE ARTHROPLASTY WITH ANTIBIOTIC SPACERS Right 11/23/2018   Procedure: REMOVAL OF RIGHT TOTAL KNEE ARTHROPLASTY AND PLACEMENT OF ANATOMIC ANTIBIOTIC SPACER;  Surgeon: [name], MD;  Location: [place];  Service: Orthopedics;  Laterality: Right;  . I & D EXTREMITY Right 11/18/2018   Procedure: RIGHT KNEE DEBRIDEMENT;  Surgeon: [name], MD;  Location: [place];  Service: Orthopedics;  Laterality: Right;  . JOINT REPLACEMENT    . KNEE ARTHROSCOPY Bilateral 1980's/1990's  . LUNG LOBECTOMY Left ~ 2006  . LUNG LOBECTOMY    . METATARSAL OSTEOTOMY  10/29/2011   Procedure: METATARSAL OSTEOTOMY;  Surgeon: [name], MD;  Location: [place];  Service: Orthopedics;  Laterality: Left;  Left 1st Metatarsal Dorsal Closing Wedge   . METATARSAL OSTEOTOMY Right 04/28/2017   Procedure: RIGHT 1ST METATARSAL DORSAL CLOSING WEDGE OSTEOTOMY;  Surgeon: [name], MD;  Location: [place];  Service: Orthopedics;  Laterality: Right;  . MULTIPLE TOOTH EXTRACTIONS    . REVISION TOTAL HIP  ARTHROPLASTY Right 2008   "4-5 months after replacement" (04/21/2012)  . TOTAL HIP ARTHROPLASTY Right 2008  . TOTAL KNEE ARTHROPLASTY Left 2006  . TOTAL KNEE ARTHROPLASTY Right 04/20/2012  . TOTAL KNEE ARTHROPLASTY Right 04/20/2012   Procedure: TOTAL KNEE ARTHROPLASTY;  Surgeon: [name], MD;  Location: [place];  Service: Orthopedics;  Laterality: Right;  Right Total Knee Arthroplasty  . TOTAL KNEE REVISION Right 03/22/2019   Procedure: RIGHT TOTAL KNEE ARTHROPLASTY REVISION OF ALL COMPONENTS;  Surgeon: [name], MD;  Location: [place];  Service: Orthopedics;  Laterality: Right;   Social History   Occupational History  . Not on file  Tobacco Use  . Smoking status: Current Every Day Smoker    Packs/day: 0.50    Years: 30.00    Pack years: 15.00    Types: Cigarettes  . Smokeless tobacco: Never Used  . Tobacco comment: thinking about quitting, talking with PCP  Vaping Use  . Vaping Use: Former  Substance and Sexual Activity  . Alcohol use: Not Currently    Alcohol/week: 0.0 standard drinks  . Drug use: Not Currently    Types: Cocaine    Comment: Cocaine and heroin - 10 years ago  . Sexual activity: Yes    Birth control/protection: None

## 2019-11-06 ENCOUNTER — Encounter: Payer: Self-pay | Admitting: Orthopedic Surgery

## 2019-11-06 ENCOUNTER — Ambulatory Visit (INDEPENDENT_AMBULATORY_CARE_PROVIDER_SITE_OTHER): Payer: Medicare Other | Admitting: Physician Assistant

## 2019-11-06 ENCOUNTER — Other Ambulatory Visit: Payer: Self-pay

## 2019-11-06 VITALS — Ht 75.0 in | Wt 180.0 lb

## 2019-11-06 DIAGNOSIS — E11610 Type 2 diabetes mellitus with diabetic neuropathic arthropathy: Secondary | ICD-10-CM | POA: Diagnosis not present

## 2019-11-06 NOTE — Progress Notes (Signed)
Office Visit Note   Patient: [name]           Date of Birth: 1965-06-03           MRN: [id number] Visit Date: 11/06/2019              Requested by: Lucia Gaskins, MD [address] [place],  [place] [address] PCP: Lucia Gaskins, MD  Chief Complaint  Patient presents with  . Right Foot - Follow-up  . Left Foot - Follow-up      HPI: Patient presents today bilateral feet.  He feels he is doing better.  He is status post transmetatarsal amputation on the right and being followed for a stable ulcer beneath the first MTP joint on the left foot.  He is wearing shoes with orthotics and uses a doughnut on the left  Assessment & Plan: Visit Diagnoses: No diagnosis found.  Plan: Continue to offload and follow-up monthly  Follow-Up Instructions: No follow-ups on file.   Ortho Exam  Patient is alert, oriented, no adenopathy, well-dressed, normal affect, normal respiratory effort. Right amputation stump is healed without any ulcers there is some flaking skin but no erythema no cellulitis no drainage On the right he has a ulcer beneath the first MTP.  It is now at skin level.  It measures about 2 x 3 cm.  The base has good vascular tissue.  He has surrounding callus which was trimmed to healthy soft surfaces no cellulitis no evidence of infection  Imaging: No results found. No images are attached to the encounter.  Labs: Lab Results  Component Value Date   HGBA1C 6.4 (H) 03/20/2019   HGBA1C 6.4 (H) 10/31/2018   HGBA1C 6.2 (H) 06/13/2018   ESRSEDRATE 20 (H) 01/01/2019   ESRSEDRATE 120 (H) 11/16/2018   ESRSEDRATE 30 (H) 06/13/2018   CRP 1.0 (H) 01/01/2019   CRP 36 (H) 11/16/2018   CRP 5.7 (H) 06/13/2018   REPTSTATUS 11/28/2018 FINAL 11/23/2018   GRAMSTAIN  11/23/2018    FEW WBC PRESENT, PREDOMINANTLY PMN NO ORGANISMS SEEN    CULT  11/23/2018    RARE STAPHYLOCOCCUS AUREUS CRITICAL RESULT CALLED TO, READ BACK BY AND VERIFIED WITH: Deno Lunger RN, AT 1053  11/25/18 BY D. VANHOOK REGARDING CULTURE GROWTH NO ANAEROBES ISOLATED Performed at [facility], [place] [address] [place], [place] [address]    LABORGA STAPHYLOCOCCUS AUREUS 11/23/2018     Lab Results  Component Value Date   ALBUMIN 3.7 07/15/2019   ALBUMIN 4.0 03/20/2019   ALBUMIN 2.1 (L) 11/26/2018    Lab Results  Component Value Date   MG 1.8 11/28/2018   MG 1.7 11/27/2018   MG 2.0 11/26/2018   No results found for: VD25OH  No results found for: PREALBUMIN CBC EXTENDED Latest Ref Rng & Units 07/15/2019 03/22/2019 03/20/2019  WBC 4.0 - 10.5 K/uL 8.7 - 11.1(H)  RBC 4.22 - 5.81 MIL/uL 4.74 - 5.48  HGB 13.0 - 17.0 g/dL 10.4(L) 9.3(L) 14.3  HCT 39 - 52 % 33.5(L) 30.0(L) 45.9  PLT 150 - 400 K/uL 280 - 279  NEUTROABS 1.7 - 7.7 K/uL 5.4 - -  LYMPHSABS 0.7 - 4.0 K/uL 2.5 - -     Body mass index is 22.5 kg/m.  Orders:  No orders of the defined types were placed in this encounter.  No orders of the defined types were placed in this encounter.    Procedures: No procedures performed  Clinical Data: No additional findings.  ROS:  All other systems negative, except as noted in the HPI. Review of Systems  Objective: Vital Signs: Ht 6\' 3"  (1.905 m)   Wt 180 lb (81.6 kg)   BMI 22.50 kg/m   Specialty Comments:  No specialty comments available.  PMFS History: Patient Active Problem List   Diagnosis Date Noted  . Malnutrition of moderate degree 03/25/2019  . 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 PICC (peripherally inserted central catheter) in place 12/21/2018  . Medication monitoring encounter 12/21/2018  . Staphylococcus aureus bacteremia   . 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 Infected prosthetic knee joint, initial encounter ([place])   . Septic joint of right knee joint ([place]) 11/16/2018  . Hypokalemia 11/16/2018  . Anemia 11/16/2018  . Acute encephalopathy 10/31/2018  . Accidental drug overdose 10/31/2018  . Left arm weakness  10/31/2018  . History of transmetatarsal amputation of right foot ([place]) 08/30/2018  . Charcot foot due to diabetes mellitus ([place])   . Cellulitis 06/12/2018  . Diabetic foot infection ([place]) 06/22/2017  . Post op infection 06/22/2017  . SVT (supraventricular tachycardia) ([place]) 08/17/2016  . Tobacco use disorder 10/25/2014  . Diabetes mellitus type 2 in nonobese (HCC)   . Swelling   . Type 2 diabetes mellitus with left diabetic foot ulcer ([place]) 12/18/2013  . Diabetes mellitus due to underlying condition with foot ulcer ([place]) 12/18/2013  . Left leg cellulitis 12/18/2013  . Cocaine abuse ([place]) 03/29/2012  . Generalized anxiety disorder 03/29/2012  . Panic attacks 03/29/2012  . Alcohol dependence ([place]) 08/05/2011    Class: Chronic  . Substance abuse ([place]) 05/31/2011  . Ulcer of left foot, limited to breakdown of skin ([place]) 02/15/2011   Past Medical History:  Diagnosis Date  . Anemia    as a child  . Anxiety   . Arthritis    "everywhere" (04/21/2012)  . Cellulitis and abscess of foot 12/19/2013   LEFT FOOT  . Cellulitis of right foot   . Chronic pain   . Constipation   . DDD (degenerative disc disease)   . Depression    pt denies  . Diabetes mellitus without complication (HCC)    borderline  . Diabetic foot ulcer ([place])   . ETOH abuse   . Full dentures   . GERD (gastroesophageal reflux disease)    tums  . H/O amputation of lesser toe, right ([place]) 07/29/2017  . History of blood transfusion    "related to left knee OR; probably right hip too" (04/21/2012)  . History of kidney stones   . Mental disorder   . MRSA (methicillin resistant staph aureus) culture positive   . Neuromuscular disorder (HCC)    neuropathy  . Noncompliance   . Open wound    bottom of foot  . Osteomyelitis ([place])    right 4th toe  . Osteomyelitis of right foot ([place])   . Pneumonia ~ 2012  . Polysubstance abuse (HCC)    etoh, cocaine, heroin  . Stroke [facility]) 2008   "they said I might have had one during  right hip replacement" (04/21/2012)  . Wears glasses     Family History  Problem Relation Age of Onset  . Diabetes Father     Past Surgical History:  Procedure Laterality Date  . AMPUTATION Right 06/09/2017   Procedure: RIGHT 2ND TOE AMPUTATION;  Surgeon: [name], MD;  Location: [place];  Service: Orthopedics;  Laterality: Right;  . AMPUTATION Right 07/23/2017   Procedure: RIGHT 4TH TOE  AMPUTATION;  Surgeon: [name], MD;  Location: [place];  Service: Orthopedics;  Laterality: Right;  . AMPUTATION Right 06/15/2018   Procedure: RIGHT FOOT TRANSMETATARSAL AMPUTATION;  Surgeon: [name], MD;  Location: [place];  Service: Orthopedics;  Laterality: Right;  right  . EXCISIONAL TOTAL KNEE ARTHROPLASTY WITH ANTIBIOTIC SPACERS Right 11/23/2018   Procedure: REMOVAL OF RIGHT TOTAL KNEE ARTHROPLASTY AND PLACEMENT OF ANATOMIC ANTIBIOTIC SPACER;  Surgeon: [name], MD;  Location: [place];  Service: Orthopedics;  Laterality: Right;  . I & D EXTREMITY Right 11/18/2018   Procedure: RIGHT KNEE DEBRIDEMENT;  Surgeon: [name], MD;  Location: [place];  Service: Orthopedics;  Laterality: Right;  . JOINT REPLACEMENT    . KNEE ARTHROSCOPY Bilateral 1980's/1990's  . LUNG LOBECTOMY Left ~ 2006  . LUNG LOBECTOMY    . METATARSAL OSTEOTOMY  10/29/2011   Procedure: METATARSAL OSTEOTOMY;  Surgeon: [name], MD;  Location: [place];  Service: Orthopedics;  Laterality: Left;  Left 1st Metatarsal Dorsal Closing Wedge   . METATARSAL OSTEOTOMY Right 04/28/2017   Procedure: RIGHT 1ST METATARSAL DORSAL CLOSING WEDGE OSTEOTOMY;  Surgeon: [name], MD;  Location: [place];  Service: Orthopedics;  Laterality: Right;  . MULTIPLE TOOTH EXTRACTIONS    . REVISION TOTAL HIP ARTHROPLASTY Right 2008   "4-5 months after replacement" (04/21/2012)  . TOTAL HIP ARTHROPLASTY Right 2008  . TOTAL KNEE ARTHROPLASTY Left 2006  . TOTAL KNEE ARTHROPLASTY Right 04/20/2012  . TOTAL KNEE ARTHROPLASTY Right 04/20/2012   Procedure:  TOTAL KNEE ARTHROPLASTY;  Surgeon: [name], MD;  Location: [place];  Service: Orthopedics;  Laterality: Right;  Right Total Knee Arthroplasty  . TOTAL KNEE REVISION Right 03/22/2019   Procedure: RIGHT TOTAL KNEE ARTHROPLASTY REVISION OF ALL COMPONENTS;  Surgeon: [name], MD;  Location: [place];  Service: Orthopedics;  Laterality: Right;   Social History   Occupational History  . Not on file  Tobacco Use  . Smoking status: Current Every Day Smoker    Packs/day: 0.50    Years: 30.00    Pack years: 15.00    Types: Cigarettes  . Smokeless tobacco: Never Used  . Tobacco comment: thinking about quitting, talking with PCP  Vaping Use  . Vaping Use: Former  Substance and Sexual Activity  . Alcohol use: Not Currently    Alcohol/week: 0.0 standard drinks  . Drug use: Not Currently    Types: Cocaine    Comment: Cocaine and heroin - 10 years ago  . Sexual activity: Yes    Birth control/protection: None

## 2019-11-09 NOTE — Progress Notes (Deleted)
Referring Provider:Dondiego, Delfino Lovett, MD Primary Care Physician:  Lucia Gaskins, MD Primary Gastroenterologist:  Dr. Abbey Chatters  No chief complaint on file.   HPI:   [name] is a 54 y.o. male presenting today at the request of Lucia Gaskins, MD for consult colonoscopy.  Recommended office visit due to meds and alcohol.  Recently in the ER for alcohol intoxication in June 2021.  Reported being sober for 3 to 4 years but had been on a 5-day binge episode.  He fell and landed on his left hip which is likely what caused him to come to the emergency room.  Also reported hitting his head.  CT head and CT cervical spine with no acute findings.  DG left hip without fractures.  Labs with sodium 125, hemoglobin 10.4 with microcytic indices, alcohol level 199, UDS positive for cocaine.  He was given IV fluids and sodium improved to 130.  Today:    Anemia:    Past Medical History:  Diagnosis Date  . Anemia    as a child  . Anxiety   . Arthritis    "everywhere" (04/21/2012)  . Cellulitis and abscess of foot 12/19/2013   LEFT FOOT  . Cellulitis of right foot   . Chronic pain   . Constipation   . DDD (degenerative disc disease)   . Depression    pt denies  . Diabetes mellitus without complication (HCC)    borderline  . Diabetic foot ulcer ([place])   . ETOH abuse   . Full dentures   . GERD (gastroesophageal reflux disease)    tums  . H/O amputation of lesser toe, right ([place]) 07/29/2017  . History of blood transfusion    "related to left knee OR; probably right hip too" (04/21/2012)  . History of kidney stones   . Mental disorder   . MRSA (methicillin resistant staph aureus) culture positive   . Neuromuscular disorder (HCC)    neuropathy  . Noncompliance   . Open wound    bottom of foot  . Osteomyelitis ([place])    right 4th toe  . Osteomyelitis of right foot ([place])   . Pneumonia ~ 2012  . Polysubstance abuse (HCC)    etoh, cocaine, heroin  . Stroke [facility]) 2008   "they said  I might have had one during right hip replacement" (04/21/2012)  . Wears glasses     Past Surgical History:  Procedure Laterality Date  . AMPUTATION Right 06/09/2017   Procedure: RIGHT 2ND TOE AMPUTATION;  Surgeon: [name], MD;  Location: [place];  Service: Orthopedics;  Laterality: Right;  . AMPUTATION Right 07/23/2017   Procedure: RIGHT 4TH TOE AMPUTATION;  Surgeon: [name], MD;  Location: [place];  Service: Orthopedics;  Laterality: Right;  . AMPUTATION Right 06/15/2018   Procedure: RIGHT FOOT TRANSMETATARSAL AMPUTATION;  Surgeon: [name], MD;  Location: [place];  Service: Orthopedics;  Laterality: Right;  right  . EXCISIONAL TOTAL KNEE ARTHROPLASTY WITH ANTIBIOTIC SPACERS Right 11/23/2018   Procedure: REMOVAL OF RIGHT TOTAL KNEE ARTHROPLASTY AND PLACEMENT OF ANATOMIC ANTIBIOTIC SPACER;  Surgeon: [name], MD;  Location: [place];  Service: Orthopedics;  Laterality: Right;  . I & D EXTREMITY Right 11/18/2018   Procedure: RIGHT KNEE DEBRIDEMENT;  Surgeon: [name], MD;  Location: [place];  Service: Orthopedics;  Laterality: Right;  . JOINT REPLACEMENT    . KNEE ARTHROSCOPY Bilateral 1980's/1990's  . LUNG LOBECTOMY Left ~ 2006  . LUNG LOBECTOMY    .  METATARSAL OSTEOTOMY  10/29/2011   Procedure: METATARSAL OSTEOTOMY;  Surgeon: [name], MD;  Location: [place];  Service: Orthopedics;  Laterality: Left;  Left 1st Metatarsal Dorsal Closing Wedge   . METATARSAL OSTEOTOMY Right 04/28/2017   Procedure: RIGHT 1ST METATARSAL DORSAL CLOSING WEDGE OSTEOTOMY;  Surgeon: [name], MD;  Location: [place];  Service: Orthopedics;  Laterality: Right;  . MULTIPLE TOOTH EXTRACTIONS    . REVISION TOTAL HIP ARTHROPLASTY Right 2008   "4-5 months after replacement" (04/21/2012)  . TOTAL HIP ARTHROPLASTY Right 2008  . TOTAL KNEE ARTHROPLASTY Left 2006  . TOTAL KNEE ARTHROPLASTY Right 04/20/2012  . TOTAL KNEE ARTHROPLASTY Right 04/20/2012   Procedure: TOTAL KNEE ARTHROPLASTY;  Surgeon: [name], MD;  Location: [place];  Service: Orthopedics;  Laterality: Right;  Right Total Knee Arthroplasty  . TOTAL KNEE REVISION Right 03/22/2019   Procedure: RIGHT TOTAL KNEE ARTHROPLASTY REVISION OF ALL COMPONENTS;  Surgeon: [name], MD;  Location: [place];  Service: Orthopedics;  Laterality: Right;    Current Outpatient Medications  Medication Sig Dispense Refill  . acetaminophen (TYLENOL) 325 MG tablet Take 1-2 tablets (325-650 mg total) by mouth every 6 (six) hours as needed for mild pain (pain score 1-3 or temp > 100.5).    Marland Kitchen aspirin EC 325 MG EC tablet Take 1 tablet (325 mg total) by mouth daily with breakfast. 30 tablet 0  . carvedilol (COREG) 25 MG tablet Take 1 tablet (25 mg total) by mouth 2 (two) times daily with a meal. 60 tablet 0  . feeding supplement, GLUCERNA SHAKE, (GLUCERNA SHAKE) LIQD Take 237 mLs by mouth 3 (three) times daily between meals.  0  . gabapentin (NEURONTIN) 300 MG capsule Take 1 capsule (300 mg total) by mouth 3 (three) times daily. 60 capsule 1  . ibuprofen (ADVIL) 200 MG tablet Take 200 mg by mouth every 6 (six) hours as needed.    . metFORMIN (GLUCOPHAGE) 500 MG tablet Take 2 tablets (1,000 mg total) by mouth 2 (two) times daily with a meal. (Patient taking differently: Take 500 mg by mouth 2 (two) times daily with a meal. ) 60 tablet 0  . nitroGLYCERIN (NITRODUR - DOSED IN MG/24 HR) 0.2 mg/hr patch Place 1 patch (0.2 mg total) onto the skin daily. 30 patch 12  . oxyCODONE 10 MG TABS Take 1-1.5 tablets (10-15 mg total) by mouth every 4 (four) hours as needed for severe pain (pain score 7-10). 30 tablet 0  . polyethylene glycol (MIRALAX / GLYCOLAX) 17 g packet Take 17 g by mouth daily as needed for mild constipation. 14 each 0  . rosuvastatin (CRESTOR) 20 MG tablet Take 1 tablet (20 mg total) by mouth daily. 30 tablet 0  . silver sulfADIAZINE (SILVADENE) 1 % cream Apply 1 application topically daily. 400 g 3   No current facility-administered medications for  this visit.    Allergies as of 11/10/2019 - Review Complete 11/06/2019  Allergen Reaction Noted  . Benadryl [diphenhydramine hcl] Other (See Comments) 02/14/2011  . Trazodone and nefazodone Other (See Comments) 03/02/2012    Family History  Problem Relation Age of Onset  . Diabetes Father     Social History   Socioeconomic History  . Marital status: Divorced    Spouse name: Not on file  . Number of children: Not on file  . Years of education: Not on file  . Highest education level: Not on file  Occupational History  . Not on file  Tobacco Use  . Smoking status: Current Every Day Smoker    Packs/day: 0.50    Years: 30.00    Pack years: 15.00    Types: Cigarettes  . Smokeless tobacco: Never Used  . Tobacco comment: thinking about quitting, talking with PCP  Vaping Use  . Vaping Use: Former  Substance and Sexual Activity  . Alcohol use: Not Currently    Alcohol/week: 0.0 standard drinks  . Drug use: Not Currently    Types: Cocaine    Comment: Cocaine and heroin - 10 years ago  . Sexual activity: Yes    Birth control/protection: None  Other Topics Concern  . Not on file  Social History Narrative  . Not on file   Social Determinants of Health   Financial Resource Strain:   . Difficulty of Paying Living Expenses: Not on file  Food Insecurity:   . Worried About Charity fundraiser in the Last Year: Not on file  . Ran Out of Food in the Last Year: Not on file  Transportation Needs:   . Lack of Transportation (Medical): Not on file  . Lack of Transportation (Non-Medical): Not on file  Physical Activity:   . Days of Exercise per Week: Not on file  . Minutes of Exercise per Session: Not on file  Stress:   . Feeling of Stress : Not on file  Social Connections:   . Frequency of Communication with Friends and Family: Not on file  . Frequency of Social Gatherings with Friends and Family: Not on file  . Attends Religious Services: Not on file  . Active Member of  Clubs or Organizations: Not on file  . Attends Archivist Meetings: Not on file  . Marital Status: Not on file  Intimate Partner Violence:   . Fear of Current or Ex-Partner: Not on file  . Emotionally Abused: Not on file  . Physically Abused: Not on file  . Sexually Abused: Not on file    Review of Systems: Gen: Denies any fever, chills, fatigue, weight loss, lack of appetite.  CV: Denies chest pain, heart palpitations, peripheral edema, syncope.  Resp: Denies shortness of breath at rest or with exertion. Denies wheezing or cough.  GI: Denies dysphagia or odynophagia. Denies jaundice, hematemesis, fecal incontinence. GU : Denies urinary burning, urinary frequency, urinary hesitancy MS: Denies joint pain, muscle weakness, cramps, or limitation of movement.  Derm: Denies rash, itching, dry skin Psych: Denies depression, anxiety, memory loss, and confusion Heme: Denies bruising, bleeding, and enlarged lymph nodes.  Physical Exam: There were no vitals taken for this visit. General:   Alert and oriented. Pleasant and cooperative. Well-nourished and well-developed.  Head:  Normocephalic and atraumatic. Eyes:  Without icterus, sclera clear and conjunctiva pink.  Ears:  Normal auditory acuity. Nose:  No deformity, discharge,  or lesions. Mouth:  No deformity or lesions, oral mucosa pink.  Neck:  Supple, without mass or thyromegaly. Lungs:  Clear to auscultation bilaterally. No wheezes, rales, or rhonchi. No distress.  Heart:  S1, S2 present without murmurs appreciated.  Abdomen:  +BS, soft, non-tender and non-distended. No HSM noted. No guarding or rebound. No masses appreciated.  Rectal:  Deferred  Msk:  Symmetrical without gross deformities. Normal posture. Pulses:  Normal pulses noted. Extremities:  Without clubbing or edema. Neurologic:  Alert and  oriented x4;  grossly normal neurologically. Skin:  Intact without significant lesions or rashes. Cervical Nodes:  No  significant cervical adenopathy. Psych:  Alert and cooperative. Normal  mood and affect.

## 2019-11-10 ENCOUNTER — Ambulatory Visit: Payer: Medicare Other | Admitting: Gastroenterology

## 2019-11-29 ENCOUNTER — Other Ambulatory Visit: Payer: Self-pay | Admitting: Family

## 2019-11-29 DIAGNOSIS — R200 Anesthesia of skin: Secondary | ICD-10-CM

## 2019-12-04 ENCOUNTER — Ambulatory Visit (INDEPENDENT_AMBULATORY_CARE_PROVIDER_SITE_OTHER): Payer: Medicare Other | Admitting: Orthopedic Surgery

## 2019-12-04 ENCOUNTER — Encounter: Payer: Self-pay | Admitting: Orthopedic Surgery

## 2019-12-04 VITALS — Ht 75.0 in | Wt 180.0 lb

## 2019-12-04 DIAGNOSIS — L97511 Non-pressure chronic ulcer of other part of right foot limited to breakdown of skin: Secondary | ICD-10-CM | POA: Diagnosis not present

## 2019-12-04 DIAGNOSIS — L97521 Non-pressure chronic ulcer of other part of left foot limited to breakdown of skin: Secondary | ICD-10-CM

## 2019-12-04 DIAGNOSIS — Z89431 Acquired absence of right foot: Secondary | ICD-10-CM

## 2019-12-04 NOTE — Progress Notes (Signed)
Office Visit Note   Patient: [name]           Date of Birth: 07/23/1965           MRN: [id number] Visit Date: 12/04/2019              Requested by: Lucia Gaskins, MD [address] [place],  [place] [address] PCP: Lucia Gaskins, MD  Chief Complaint  Patient presents with  . Right Foot - Follow-up  . Left Foot - Follow-up      HPI: Patient is a 54 year old gentleman who presents complaining of recurrent ulcers beneath the right transmetatarsal amputation and the left great toe first metatarsal head he is currently wearing old worn out custom orthotics and extra-depth shoes.  Assessment & Plan: Visit Diagnoses:  1. Non-pressure chronic ulcer of other part of right foot limited to breakdown of skin ([place])   2. History of transmetatarsal amputation of right foot ([place])   3. Ulcer of left foot, limited to breakdown of skin ([place])     Plan: Patient is given a new prescription for his orthotist in [place] for custom orthotic on the right with a spacer and posting around the fourth metatarsal head as well as an orthotic on the left to posterior around the first metatarsal head to unload the ulcers.  Follow-Up Instructions: Return in about 4 weeks (around 01/01/2020).   Ortho Exam  Patient is alert, oriented, no adenopathy, well-dressed, normal affect, normal respiratory effort. Examination patient has good pulses good dorsiflexion of the ankle right transmetatarsal amputation is healed well he has a Wagner grade 1 ulcer beneath the fourth metatarsal head.  After informed consent a 10 blade knife was used to debride the skin and soft tissue back to healthy viable granulation tissue after debridement the ulcer is 3 cm in diameter 1 mm deep silver nitrate was used hemostasis.  No exposed bone or tendon.  Examination of the left foot he has a Wagner grade 1 ulcer beneath the first metatarsal head.  After informed consent a 10 blade knife was used to breed the skin and soft tissue  back to healthy viable granulation tissue the ulcer was 1 cm diameter prior to debridement 2 cm in diameter after debridement the wound is 1 mm deep with no exposed bone or tendon no signs of infection.  Imaging: No results found. No images are attached to the encounter.  Labs: Lab Results  Component Value Date   HGBA1C 6.4 (H) 03/20/2019   HGBA1C 6.4 (H) 10/31/2018   HGBA1C 6.2 (H) 06/13/2018   ESRSEDRATE 20 (H) 01/01/2019   ESRSEDRATE 120 (H) 11/16/2018   ESRSEDRATE 30 (H) 06/13/2018   CRP 1.0 (H) 01/01/2019   CRP 36 (H) 11/16/2018   CRP 5.7 (H) 06/13/2018   REPTSTATUS 11/28/2018 FINAL 11/23/2018   GRAMSTAIN  11/23/2018    FEW WBC PRESENT, PREDOMINANTLY PMN NO ORGANISMS SEEN    CULT  11/23/2018    RARE STAPHYLOCOCCUS AUREUS CRITICAL RESULT CALLED TO, READ BACK BY AND VERIFIED WITH: Deno Lunger RN, AT 1053 11/25/18 BY D. VANHOOK REGARDING CULTURE GROWTH NO ANAEROBES ISOLATED Performed at [facility], [place] [address], [place], [place] [address]    LABORGA STAPHYLOCOCCUS AUREUS 11/23/2018     Lab Results  Component Value Date   ALBUMIN 3.7 07/15/2019   ALBUMIN 4.0 03/20/2019   ALBUMIN 2.1 (L) 11/26/2018    Lab Results  Component Value Date   MG 1.8 11/28/2018   MG 1.7 11/27/2018  MG 2.0 11/26/2018   No results found for: VD25OH  No results found for: PREALBUMIN CBC EXTENDED Latest Ref Rng & Units 07/15/2019 03/22/2019 03/20/2019  WBC 4.0 - 10.5 K/uL 8.7 - 11.1(H)  RBC 4.22 - 5.81 MIL/uL 4.74 - 5.48  HGB 13.0 - 17.0 g/dL 10.4(L) 9.3(L) 14.3  HCT 39 - 52 % 33.5(L) 30.0(L) 45.9  PLT 150 - 400 K/uL 280 - 279  NEUTROABS 1.7 - 7.7 K/uL 5.4 - -  LYMPHSABS 0.7 - 4.0 K/uL 2.5 - -     Body mass index is 22.5 kg/m.  Orders:  No orders of the defined types were placed in this encounter.  No orders of the defined types were placed in this encounter.    Procedures: No procedures performed  Clinical Data: No additional findings.  ROS:  All other systems  negative, except as noted in the HPI. Review of Systems  Objective: Vital Signs: Ht 6\' 3"  (1.905 m)   Wt 180 lb (81.6 kg)   BMI 22.50 kg/m   Specialty Comments:  No specialty comments available.  PMFS History: Patient Active Problem List   Diagnosis Date Noted  . Malnutrition of moderate degree 03/25/2019  . 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 PICC (peripherally inserted central catheter) in place 12/21/2018  . Medication monitoring encounter 12/21/2018  . Staphylococcus aureus bacteremia   . 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 Infected prosthetic knee joint, initial encounter ([place])   . Septic joint of right knee joint ([place]) 11/16/2018  . Hypokalemia 11/16/2018  . Anemia 11/16/2018  . Acute encephalopathy 10/31/2018  . Accidental drug overdose 10/31/2018  . Left arm weakness 10/31/2018  . History of transmetatarsal amputation of right foot ([place]) 08/30/2018  . Charcot foot due to diabetes mellitus ([place])   . Cellulitis 06/12/2018  . Diabetic foot infection ([place]) 06/22/2017  . Post op infection 06/22/2017  . SVT (supraventricular tachycardia) ([place]) 08/17/2016  . Tobacco use disorder 10/25/2014  . Diabetes mellitus type 2 in nonobese (HCC)   . Swelling   . Type 2 diabetes mellitus with left diabetic foot ulcer ([place]) 12/18/2013  . Diabetes mellitus due to underlying condition with foot ulcer ([place]) 12/18/2013  . Left leg cellulitis 12/18/2013  . Cocaine abuse ([place]) 03/29/2012  . Generalized anxiety disorder 03/29/2012  . Panic attacks 03/29/2012  . Alcohol dependence ([place]) 08/05/2011    Class: Chronic  . Substance abuse ([place]) 05/31/2011  . Ulcer of left foot, limited to breakdown of skin ([place]) 02/15/2011   Past Medical History:  Diagnosis Date  . Anemia    as a child  . Anxiety   . Arthritis    "everywhere" (04/21/2012)  . Cellulitis and abscess of foot 12/19/2013   LEFT FOOT  . Cellulitis of right foot   . Chronic pain   . Constipation    . DDD (degenerative disc disease)   . Depression    pt denies  . Diabetes mellitus without complication (HCC)    borderline  . Diabetic foot ulcer ([place])   . ETOH abuse   . Full dentures   . GERD (gastroesophageal reflux disease)    tums  . H/O amputation of lesser toe, right ([place]) 07/29/2017  . History of blood transfusion    "related to left knee OR; probably right hip too" (04/21/2012)  . History of kidney stones   . Mental disorder   . MRSA (methicillin resistant staph aureus) culture positive   . Neuromuscular disorder ([place])  neuropathy  . Noncompliance   . Open wound    bottom of foot  . Osteomyelitis ([place])    right 4th toe  . Osteomyelitis of right foot ([place])   . Pneumonia ~ 2012  . Polysubstance abuse (HCC)    etoh, cocaine, heroin  . Stroke [facility]) 2008   "they said I might have had one during right hip replacement" (04/21/2012)  . Wears glasses     Family History  Problem Relation Age of Onset  . Diabetes Father     Past Surgical History:  Procedure Laterality Date  . AMPUTATION Right 06/09/2017   Procedure: RIGHT 2ND TOE AMPUTATION;  Surgeon: [name], MD;  Location: [place];  Service: Orthopedics;  Laterality: Right;  . AMPUTATION Right 07/23/2017   Procedure: RIGHT 4TH TOE AMPUTATION;  Surgeon: [name], MD;  Location: [place];  Service: Orthopedics;  Laterality: Right;  . AMPUTATION Right 06/15/2018   Procedure: RIGHT FOOT TRANSMETATARSAL AMPUTATION;  Surgeon: [name], MD;  Location: [place];  Service: Orthopedics;  Laterality: Right;  right  . EXCISIONAL TOTAL KNEE ARTHROPLASTY WITH ANTIBIOTIC SPACERS Right 11/23/2018   Procedure: REMOVAL OF RIGHT TOTAL KNEE ARTHROPLASTY AND PLACEMENT OF ANATOMIC ANTIBIOTIC SPACER;  Surgeon: [name], MD;  Location: [place];  Service: Orthopedics;  Laterality: Right;  . I & D EXTREMITY Right 11/18/2018   Procedure: RIGHT KNEE DEBRIDEMENT;  Surgeon: [name], MD;  Location: [place];  Service: Orthopedics;   Laterality: Right;  . JOINT REPLACEMENT    . KNEE ARTHROSCOPY Bilateral 1980's/1990's  . LUNG LOBECTOMY Left ~ 2006  . LUNG LOBECTOMY    . METATARSAL OSTEOTOMY  10/29/2011   Procedure: METATARSAL OSTEOTOMY;  Surgeon: [name], MD;  Location: [place];  Service: Orthopedics;  Laterality: Left;  Left 1st Metatarsal Dorsal Closing Wedge   . METATARSAL OSTEOTOMY Right 04/28/2017   Procedure: RIGHT 1ST METATARSAL DORSAL CLOSING WEDGE OSTEOTOMY;  Surgeon: [name], MD;  Location: [place];  Service: Orthopedics;  Laterality: Right;  . MULTIPLE TOOTH EXTRACTIONS    . REVISION TOTAL HIP ARTHROPLASTY Right 2008   "4-5 months after replacement" (04/21/2012)  . TOTAL HIP ARTHROPLASTY Right 2008  . TOTAL KNEE ARTHROPLASTY Left 2006  . TOTAL KNEE ARTHROPLASTY Right 04/20/2012  . TOTAL KNEE ARTHROPLASTY Right 04/20/2012   Procedure: TOTAL KNEE ARTHROPLASTY;  Surgeon: [name], MD;  Location: [place];  Service: Orthopedics;  Laterality: Right;  Right Total Knee Arthroplasty  . TOTAL KNEE REVISION Right 03/22/2019   Procedure: RIGHT TOTAL KNEE ARTHROPLASTY REVISION OF ALL COMPONENTS;  Surgeon: [name], MD;  Location: [place];  Service: Orthopedics;  Laterality: Right;   Social History   Occupational History  . Not on file  Tobacco Use  . Smoking status: Current Every Day Smoker    Packs/day: 0.50    Years: 30.00    Pack years: 15.00    Types: Cigarettes  . Smokeless tobacco: Never Used  . Tobacco comment: thinking about quitting, talking with PCP  Vaping Use  . Vaping Use: Former  Substance and Sexual Activity  . Alcohol use: Not Currently    Alcohol/week: 0.0 standard drinks  . Drug use: Not Currently    Types: Cocaine    Comment: Cocaine and heroin - 10 years ago  . Sexual activity: Yes    Birth control/protection: None

## 2019-12-19 IMAGING — DX DG KNEE COMPLETE 4+V*L*
4 series · 4 of 4 positions shown · non-contrast
Comparison: 08/16/2016

ADDENDUM:
There may be a degree of patella baja in this case which could be
significant.
CLINICAL DATA: Chronic bilateral knee pain.

EXAM:
LEFT KNEE - COMPLETE 4+ VIEW

[knee ap (1 of 3)]
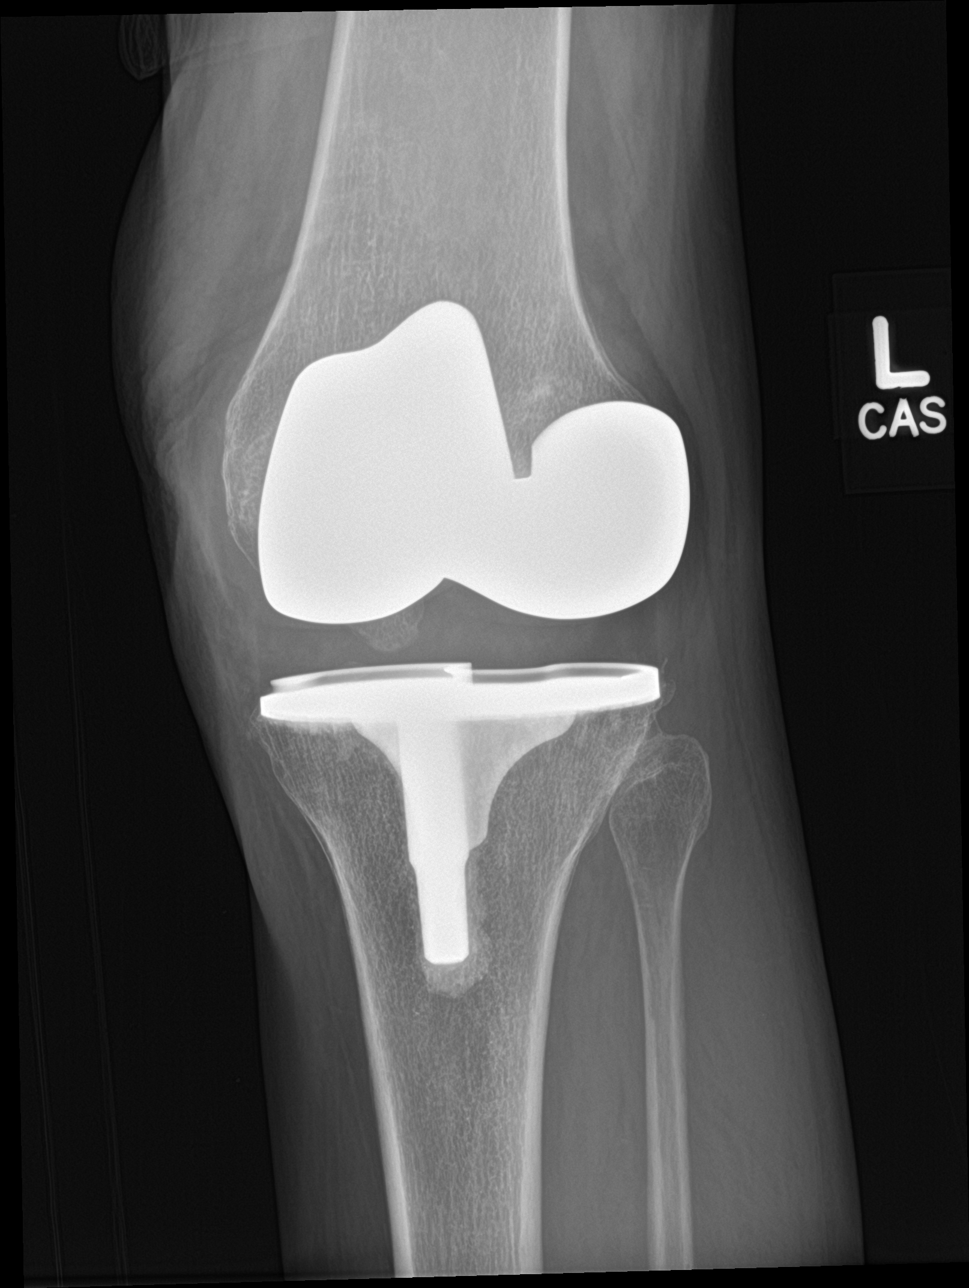

[knee lat]
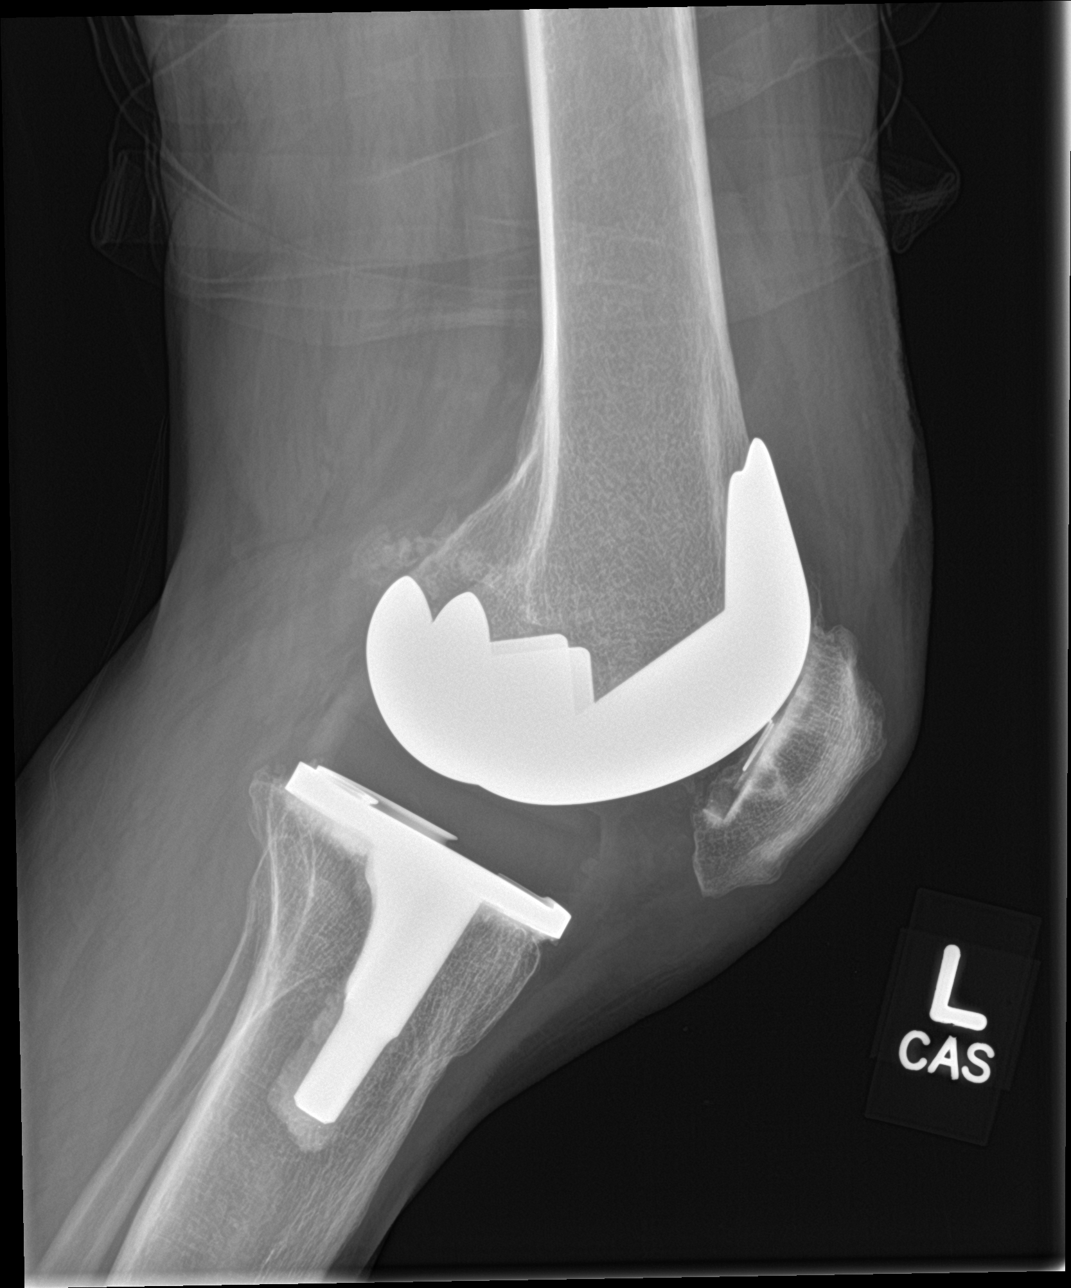

[knee ap (2 of 3)]
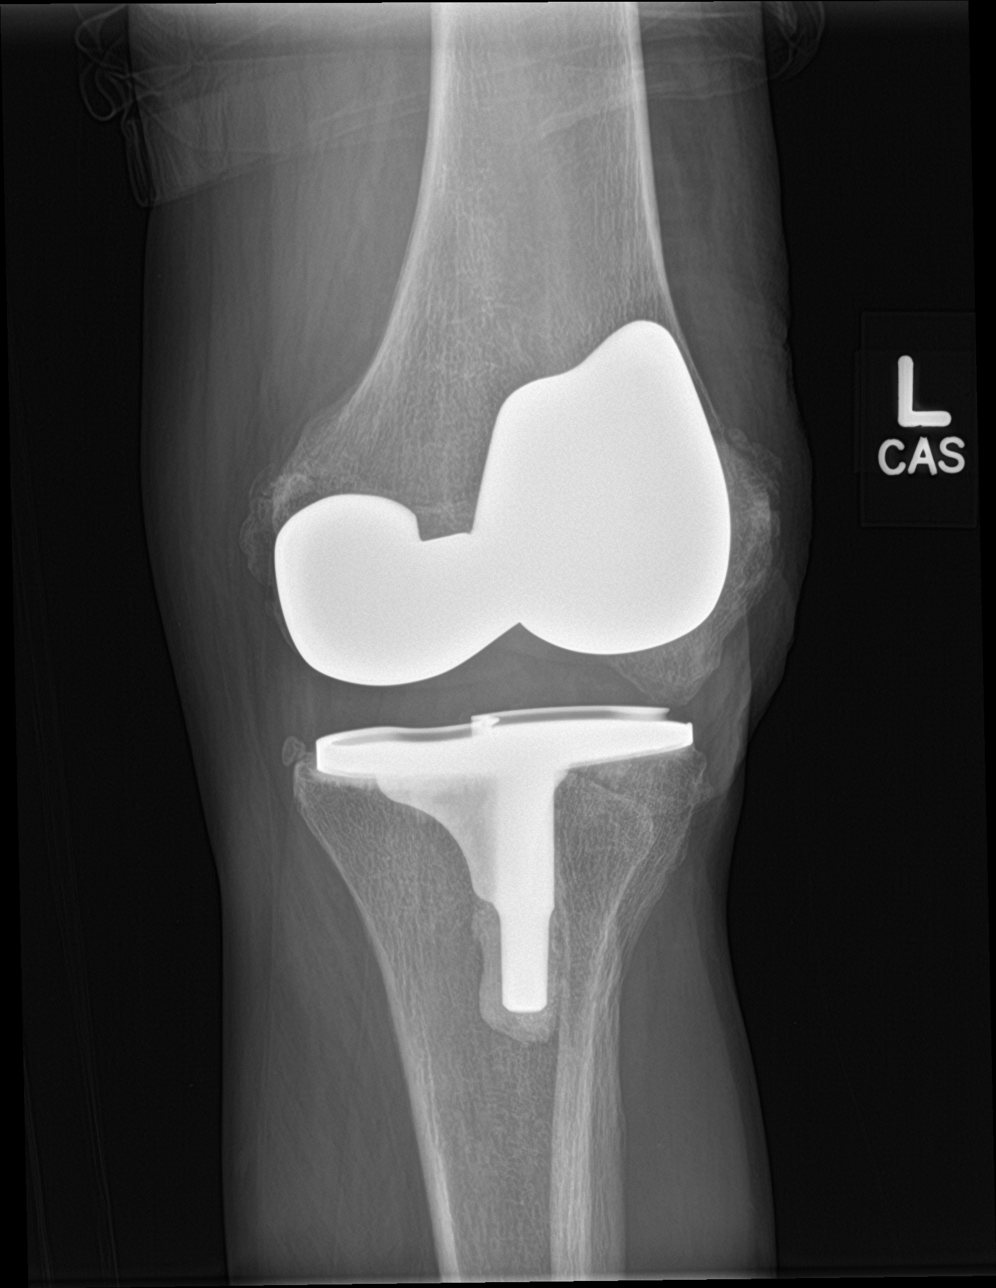

[knee ap (3 of 3)]
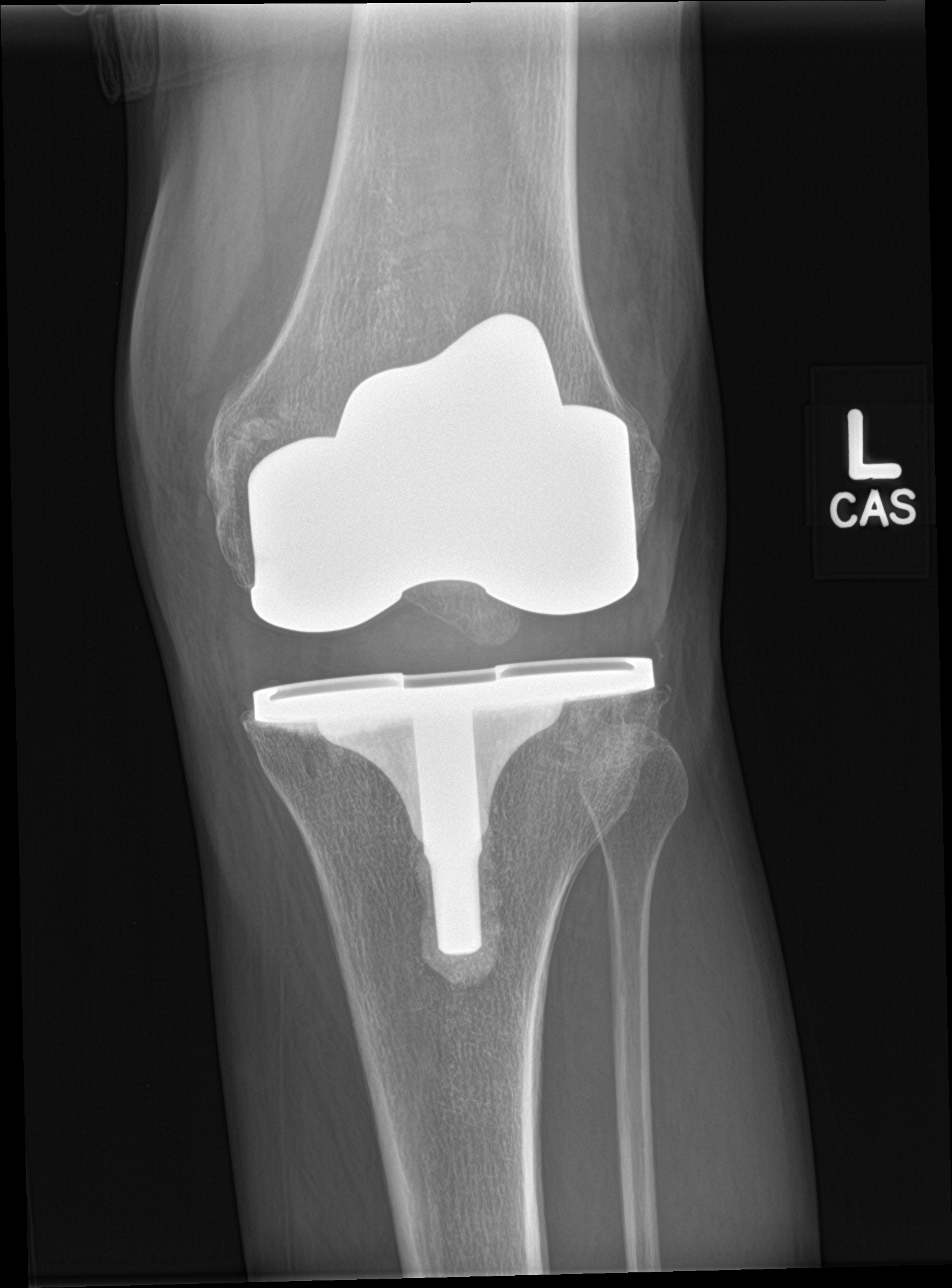

[4 of 4 positions shown; findings below may reference images not displayed]

FINDINGS: Previous total knee replacement. Components appear well positioned
and unchanged. No visible effusion.
IMPRESSION: No complicating finding. Good appearance following total knee
replacement.

## 2019-12-19 IMAGING — DX DG KNEE COMPLETE 4+V*R*
4 series · 4 of 4 positions shown · non-contrast
Comparison: 01/06/2017

CLINICAL DATA: Pain following knee replacement.

EXAM:
RIGHT KNEE - COMPLETE 4+ VIEW

[knee ap (1 of 3)]
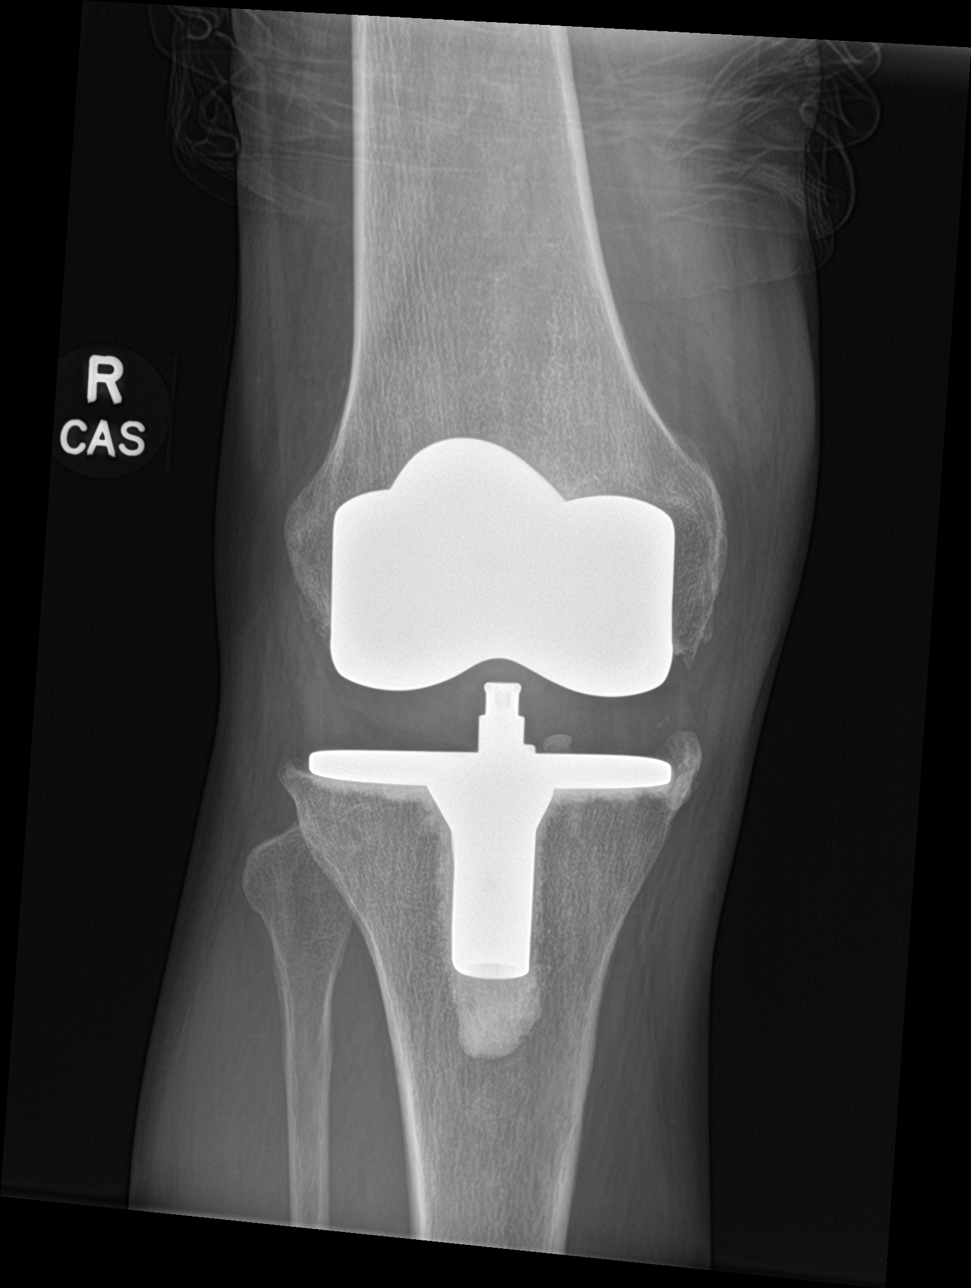

[knee lat]
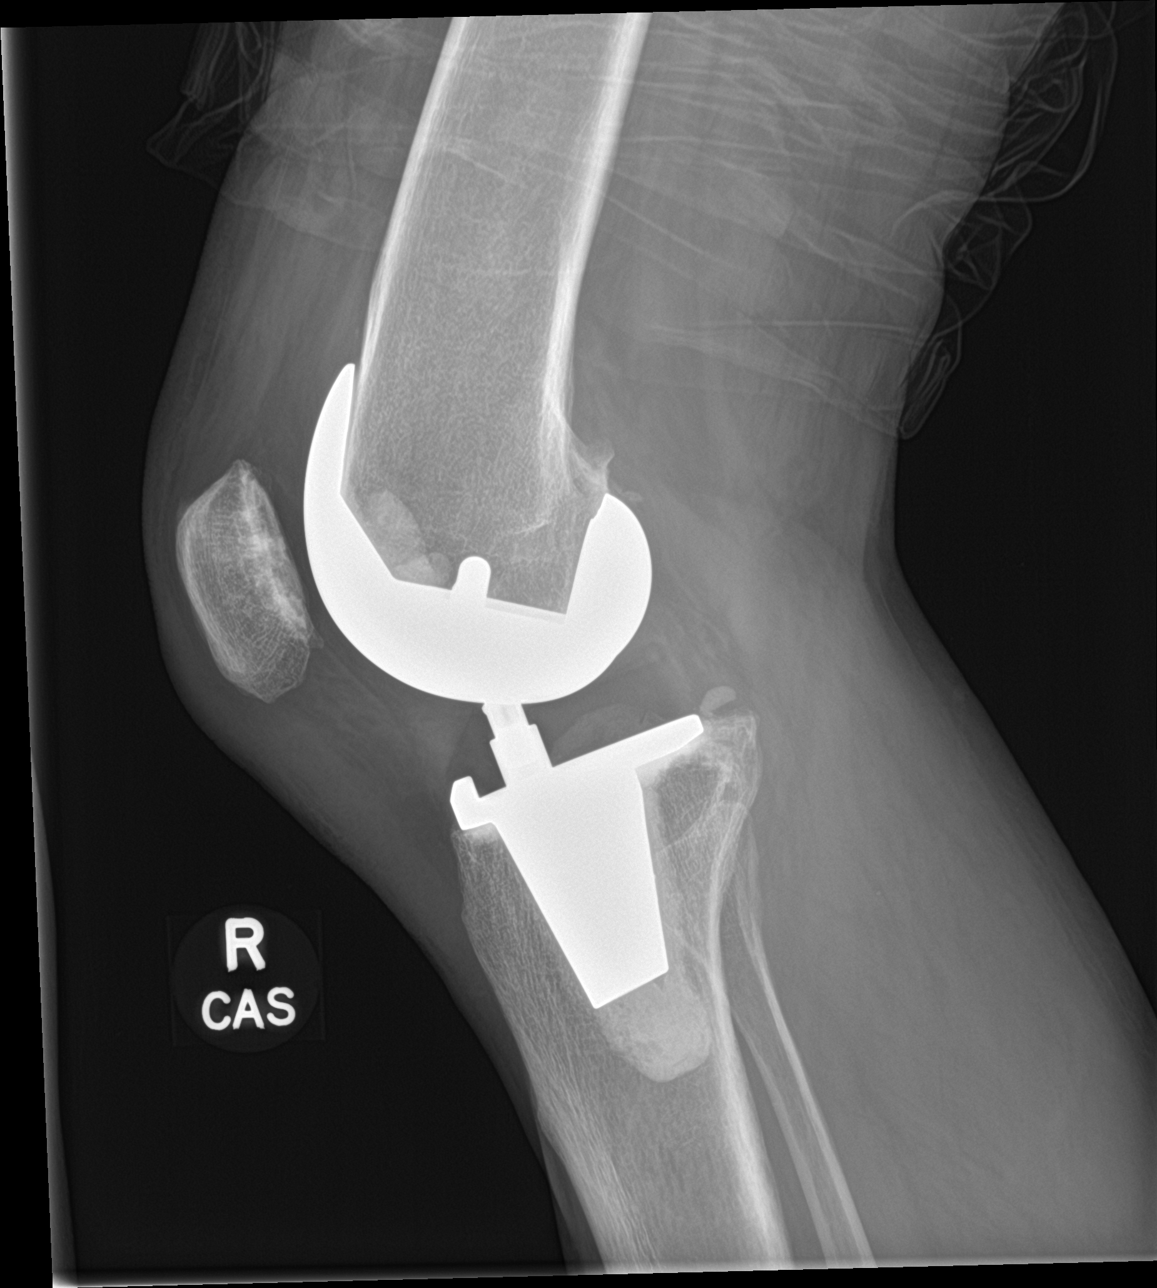

[knee ap (2 of 3)]
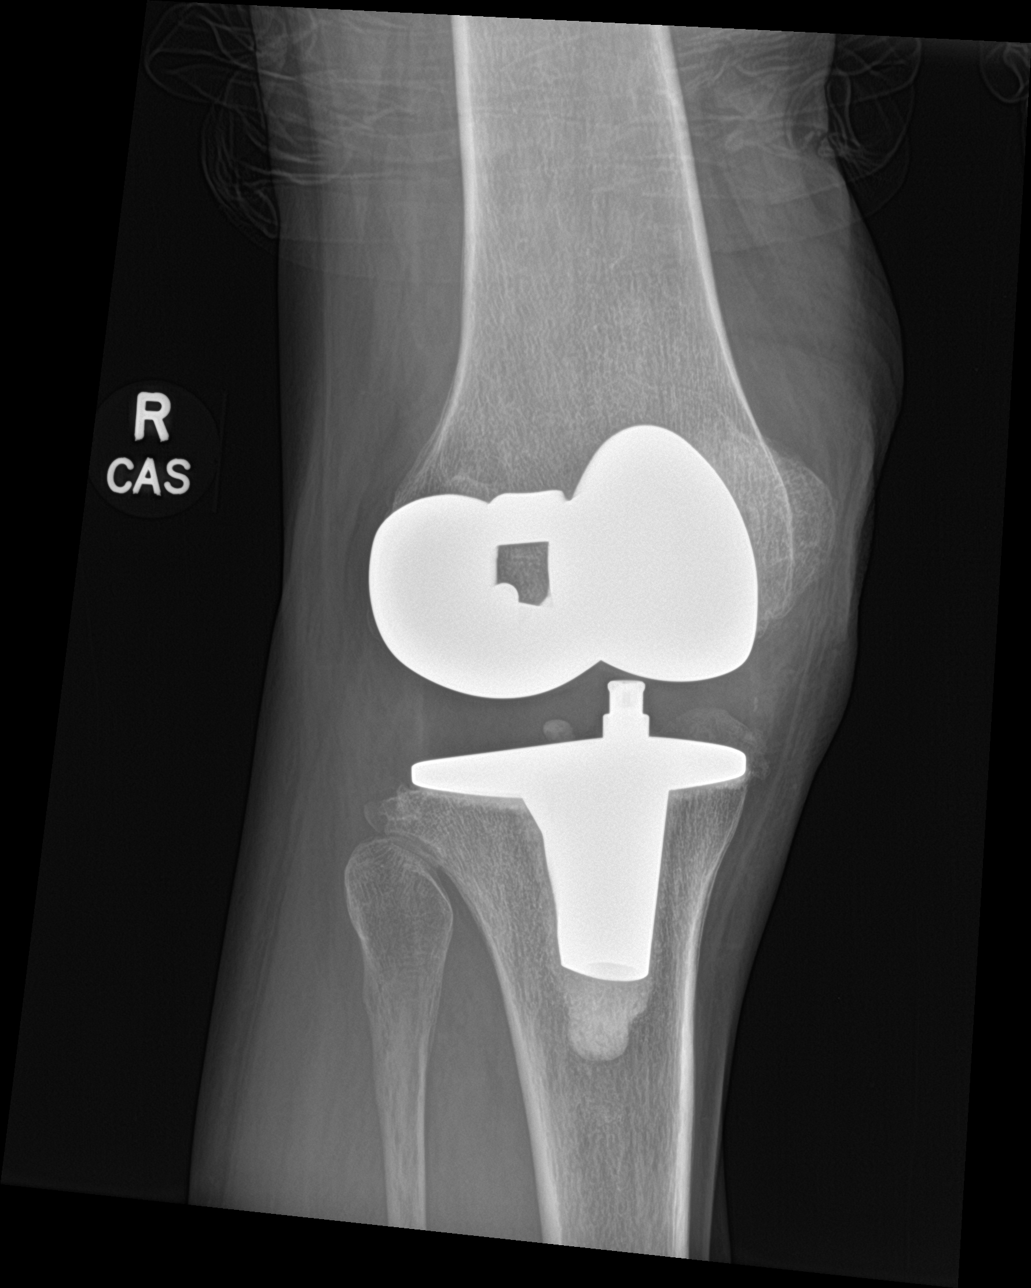

[knee ap (3 of 3)]
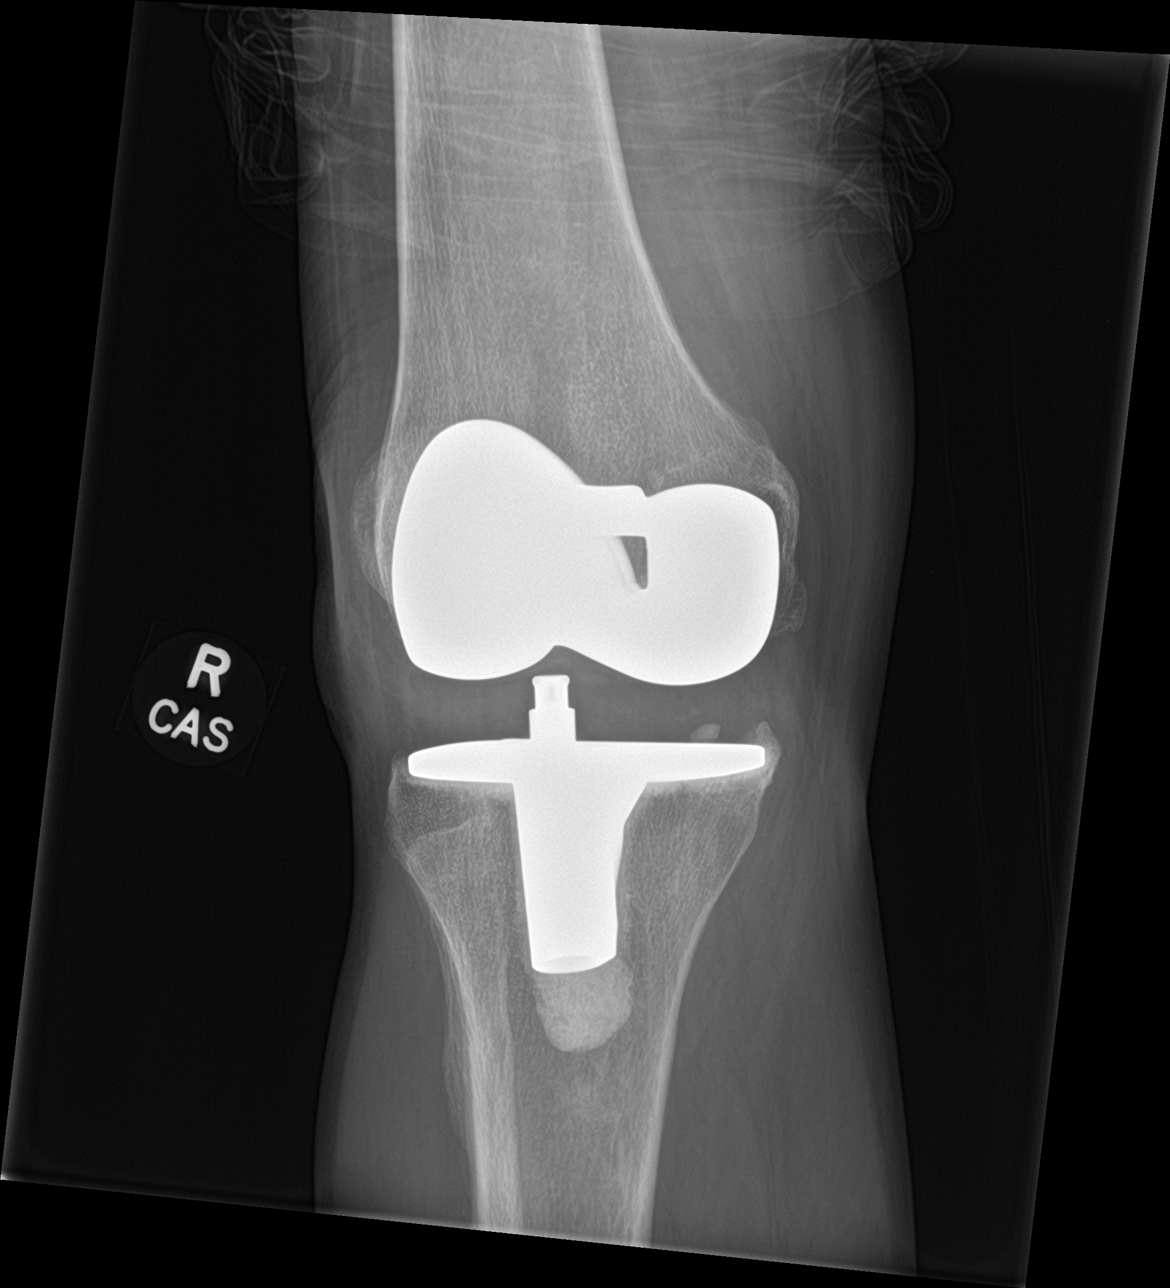

[4 of 4 positions shown; findings below may reference images not displayed]

FINDINGS: No change. Components appear the same, well positioned. No
radiographically detectable complication. No visible effusion.
IMPRESSION: No change. No complicating feature. Good appearance following total
knee replacement.

## 2020-01-01 ENCOUNTER — Ambulatory Visit (INDEPENDENT_AMBULATORY_CARE_PROVIDER_SITE_OTHER): Payer: Medicare Other | Admitting: Orthopedic Surgery

## 2020-01-01 ENCOUNTER — Encounter: Payer: Self-pay | Admitting: Orthopedic Surgery

## 2020-01-01 VITALS — Ht 75.0 in | Wt 180.0 lb

## 2020-01-01 DIAGNOSIS — E11610 Type 2 diabetes mellitus with diabetic neuropathic arthropathy: Secondary | ICD-10-CM | POA: Diagnosis not present

## 2020-01-01 NOTE — Progress Notes (Signed)
Office Visit Note   Patient: [name]           Date of Birth: [birth year]           MRN: [id number] Visit Date: 01/01/2020              Requested by: Lucia Gaskins, MD [address] [place],  [place] [address] PCP: Lucia Gaskins, MD  Chief Complaint  Patient presents with  . Right Foot - Follow-up  . Left Foot - Follow-up      HPI: Patient presents today to follow-up on his calluses and ulcers.  On the right side he has a thickened callus beneath his transmetatarsal amputation stump.  On the left side he has a plantar ulcer beneath the metatarsal head.  He thinks both are doing well.  He has not had a chance to get together with an orthotist yet but is intending to do this after the holidays  Assessment & Plan: Visit Diagnoses: No diagnosis found.  Plan: Patient will follow up in a month sooner if he has any difficulties  Follow-Up Instructions: No follow-ups on file.   Ortho Exam  Patient is alert, oriented, no adenopathy, well-dressed, normal affect, normal respiratory effort.  Bilateral lower extremities.  Neither his foot or his amputation stump have any cellulitis.  No foul odor.  On the right side there is just a thickened callus which was slightly debrided to some softer skin beneath the amputation stump no evidence of any infection no fluctuance no swelling on the left side thickened callus was trimmed again to a healthy surface on this ulcer.  Does not probe deeply is without drainage areas were touched with silver nitrate with things were bleeding  Imaging: No results found. No images are attached to the encounter.  Labs: Lab Results  Component Value Date   HGBA1C 6.4 (H) 03/20/2019   HGBA1C 6.4 (H) 10/31/2018   HGBA1C 6.2 (H) 06/13/2018   ESRSEDRATE 20 (H) 01/01/2019   ESRSEDRATE 120 (H) 11/16/2018   ESRSEDRATE 30 (H) 06/13/2018   CRP 1.0 (H) 01/01/2019   CRP 36 (H) 11/16/2018   CRP 5.7 (H) 06/13/2018   REPTSTATUS 11/28/2018 FINAL 11/23/2018    GRAMSTAIN  11/23/2018    FEW WBC PRESENT, PREDOMINANTLY PMN NO ORGANISMS SEEN    CULT  11/23/2018    RARE STAPHYLOCOCCUS AUREUS CRITICAL RESULT CALLED TO, READ BACK BY AND VERIFIED WITH: Deno Lunger RN, AT 1053 11/25/18 BY D. VANHOOK REGARDING CULTURE GROWTH NO ANAEROBES ISOLATED Performed at [facility], [place] [address] [place], [place] [address]    LABORGA STAPHYLOCOCCUS AUREUS 11/23/2018     Lab Results  Component Value Date   ALBUMIN 3.7 07/15/2019   ALBUMIN 4.0 03/20/2019   ALBUMIN 2.1 (L) 11/26/2018    Lab Results  Component Value Date   MG 1.8 11/28/2018   MG 1.7 11/27/2018   MG 2.0 11/26/2018   No results found for: VD25OH  No results found for: PREALBUMIN CBC EXTENDED Latest Ref Rng & Units 07/15/2019 03/22/2019 03/20/2019  WBC 4.0 - 10.5 K/uL 8.7 - 11.1(H)  RBC 4.22 - 5.81 MIL/uL 4.74 - 5.48  HGB 13.0 - 17.0 g/dL 10.4(L) 9.3(L) 14.3  HCT 39.0 - 52.0 % 33.5(L) 30.0(L) 45.9  PLT 150 - 400 K/uL 280 - 279  NEUTROABS 1.7 - 7.7 K/uL 5.4 - -  LYMPHSABS 0.7 - 4.0 K/uL 2.5 - -     Body mass index is 22.5 kg/m.  Orders:  No orders  of the defined types were placed in this encounter.  No orders of the defined types were placed in this encounter.    Procedures: No procedures performed  Clinical Data: No additional findings.  ROS:  All other systems negative, except as noted in the HPI. Review of Systems  Objective: Vital Signs: Ht 6\' 3"  (1.905 m)   Wt 180 lb (81.6 kg)   BMI 22.50 kg/m   Specialty Comments:  No specialty comments available.  PMFS History: Patient Active Problem List   Diagnosis Date Noted  . Malnutrition of moderate degree 03/25/2019  . 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 PICC (peripherally inserted central catheter) in place 12/21/2018  . Medication monitoring encounter 12/21/2018  . Staphylococcus aureus bacteremia   . 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 Infected prosthetic knee joint,  initial encounter ([place])   . Septic joint of right knee joint ([place]) 11/16/2018  . Hypokalemia 11/16/2018  . Anemia 11/16/2018  . Acute encephalopathy 10/31/2018  . Accidental drug overdose 10/31/2018  . Left arm weakness 10/31/2018  . History of transmetatarsal amputation of right foot ([place]) 08/30/2018  . Charcot foot due to diabetes mellitus ([place])   . Cellulitis 06/12/2018  . Diabetic foot infection ([place]) 06/22/2017  . Post op infection 06/22/2017  . SVT (supraventricular tachycardia) ([place]) 08/17/2016  . Tobacco use disorder 10/25/2014  . Diabetes mellitus type 2 in nonobese (HCC)   . Swelling   . Type 2 diabetes mellitus with left diabetic foot ulcer ([place]) 12/18/2013  . Diabetes mellitus due to underlying condition with foot ulcer ([place]) 12/18/2013  . Left leg cellulitis 12/18/2013  . Cocaine abuse ([place]) 03/29/2012  . Generalized anxiety disorder 03/29/2012  . Panic attacks 03/29/2012  . Alcohol dependence ([employer]) 08/05/2011    Class: Chronic  . Substance abuse ([place]) 05/31/2011  . Ulcer of left foot, limited to breakdown of skin ([place]) 02/15/2011   Past Medical History:  Diagnosis Date  . Anemia    as a child  . Anxiety   . Arthritis    "everywhere" (04/21/2012)  . Cellulitis and abscess of foot 12/19/2013   LEFT FOOT  . Cellulitis of right foot   . Chronic pain   . Constipation   . DDD (degenerative disc disease)   . Depression    pt denies  . Diabetes mellitus without complication (HCC)    borderline  . Diabetic foot ulcer ([place])   . ETOH abuse   . Full dentures   . GERD (gastroesophageal reflux disease)    tums  . H/O amputation of lesser toe, right ([place]) 07/29/2017  . History of blood transfusion    "related to left knee OR; probably right hip too" (04/21/2012)  . History of kidney stones   . Mental disorder   . MRSA (methicillin resistant staph aureus) culture positive   . Neuromuscular disorder (HCC)    neuropathy  . Noncompliance   . Open wound    bottom of  foot  . Osteomyelitis ([place])    right 4th toe  . Osteomyelitis of right foot ([place])   . Pneumonia ~ 2012  . Polysubstance abuse (HCC)    etoh, cocaine, heroin  . Stroke [facility]) 2008   "they said I might have had one during right hip replacement" (04/21/2012)  . Wears glasses     Family History  Problem Relation Age of Onset  . Diabetes Father     Past Surgical History:  Procedure Laterality Date  .  AMPUTATION Right 06/09/2017   Procedure: RIGHT 2ND TOE AMPUTATION;  Surgeon: [name], MD;  Location: [place];  Service: Orthopedics;  Laterality: Right;  . AMPUTATION Right 07/23/2017   Procedure: RIGHT 4TH TOE AMPUTATION;  Surgeon: [name], MD;  Location: [place];  Service: Orthopedics;  Laterality: Right;  . AMPUTATION Right 06/15/2018   Procedure: RIGHT FOOT TRANSMETATARSAL AMPUTATION;  Surgeon: [name], MD;  Location: [place];  Service: Orthopedics;  Laterality: Right;  right  . EXCISIONAL TOTAL KNEE ARTHROPLASTY WITH ANTIBIOTIC SPACERS Right 11/23/2018   Procedure: REMOVAL OF RIGHT TOTAL KNEE ARTHROPLASTY AND PLACEMENT OF ANATOMIC ANTIBIOTIC SPACER;  Surgeon: [name], MD;  Location: [place];  Service: Orthopedics;  Laterality: Right;  . I & D EXTREMITY Right 11/18/2018   Procedure: RIGHT KNEE DEBRIDEMENT;  Surgeon: [name], MD;  Location: [place];  Service: Orthopedics;  Laterality: Right;  . JOINT REPLACEMENT    . KNEE ARTHROSCOPY Bilateral 1980's/1990's  . LUNG LOBECTOMY Left ~ 2006  . LUNG LOBECTOMY    . METATARSAL OSTEOTOMY  10/29/2011   Procedure: METATARSAL OSTEOTOMY;  Surgeon: [name], MD;  Location: [place];  Service: Orthopedics;  Laterality: Left;  Left 1st Metatarsal Dorsal Closing Wedge   . METATARSAL OSTEOTOMY Right 04/28/2017   Procedure: RIGHT 1ST METATARSAL DORSAL CLOSING WEDGE OSTEOTOMY;  Surgeon: [name], MD;  Location: [place];  Service: Orthopedics;  Laterality: Right;  . MULTIPLE TOOTH EXTRACTIONS    . REVISION TOTAL HIP ARTHROPLASTY Right  2008   "4-5 months after replacement" (04/21/2012)  . TOTAL HIP ARTHROPLASTY Right 2008  . TOTAL KNEE ARTHROPLASTY Left 2006  . TOTAL KNEE ARTHROPLASTY Right 04/20/2012  . TOTAL KNEE ARTHROPLASTY Right 04/20/2012   Procedure: TOTAL KNEE ARTHROPLASTY;  Surgeon: [name], MD;  Location: [place];  Service: Orthopedics;  Laterality: Right;  Right Total Knee Arthroplasty  . TOTAL KNEE REVISION Right 03/22/2019   Procedure: RIGHT TOTAL KNEE ARTHROPLASTY REVISION OF ALL COMPONENTS;  Surgeon: [name], MD;  Location: [place];  Service: Orthopedics;  Laterality: Right;   Social History   Occupational History  . Not on file  Tobacco Use  . Smoking status: Current Every Day Smoker    Packs/day: 0.50    Years: 30.00    Pack years: 15.00    Types: Cigarettes  . Smokeless tobacco: Never Used  . Tobacco comment: thinking about quitting, talking with PCP  Vaping Use  . Vaping Use: Former  Substance and Sexual Activity  . Alcohol use: Not Currently    Alcohol/week: 0.0 standard drinks  . Drug use: Not Currently    Types: Cocaine    Comment: Cocaine and heroin - 10 years ago  . Sexual activity: Yes    Birth control/protection: None

## 2020-01-29 ENCOUNTER — Encounter: Payer: Self-pay | Admitting: Orthopedic Surgery

## 2020-01-29 ENCOUNTER — Ambulatory Visit (INDEPENDENT_AMBULATORY_CARE_PROVIDER_SITE_OTHER): Payer: Medicare Other

## 2020-01-29 ENCOUNTER — Ambulatory Visit (INDEPENDENT_AMBULATORY_CARE_PROVIDER_SITE_OTHER): Payer: Medicare Other | Admitting: Orthopedic Surgery

## 2020-01-29 VITALS — Ht 75.0 in | Wt 180.0 lb

## 2020-01-29 DIAGNOSIS — M79671 Pain in right foot: Secondary | ICD-10-CM | POA: Diagnosis not present

## 2020-01-29 NOTE — Progress Notes (Signed)
Office Visit Note   Patient: [name]           Date of Birth: 03/10/1965           MRN: [id number] Visit Date: 01/29/2020              Requested by: Lucia Gaskins, MD [address] [place],  [place] [address] PCP: Lucia Gaskins, MD  Chief Complaint  Patient presents with  . Right Foot - Follow-up  . Left Foot - Follow-up      HPI: Patient is a pleasant 55 year old gentleman who comes in once a month to have calluses trimmed on both of his feet.  He is supposed to obtain new inserts and shoes but unfortunately he says he does not have the money this month.  He is currently changing things up a bit so he is hopeful he can order them soon.  He did notice a bit more drainage and a blister on the bottom of his right foot.  Assessment & Plan: Visit Diagnoses:  1. Pain in right foot     Plan: Overall he is doing well.  We will call in a short course of Bactrim for him as he says doxycycline never helps him.  He will follow-up in a month or sooner if any issues  Follow-Up Instructions: No follow-ups on file.   Ortho Exam  Patient is alert, oriented, no adenopathy, well-dressed, normal affect, normal respiratory effort. Right foot thickened callus beneath transmetatarsal amputation.  There is no surrounding cellulitis or foul odor.  Just a scant amount of drainage.  After obtaining verbal consent this was trimmed to healthy surfaces there is no purulence callus. Right foot he has a thickened callus beneath his first MTP P joint.  There is no surrounding cellulitis minimal drainage.  This was debrided to healthy soft surfaces.  There was no evidence of acute infection  Imaging: No results found. No images are attached to the encounter.  Labs: Lab Results  Component Value Date   HGBA1C 6.4 (H) 03/20/2019   HGBA1C 6.4 (H) 10/31/2018   HGBA1C 6.2 (H) 06/13/2018   ESRSEDRATE 20 (H) 01/01/2019   ESRSEDRATE 120 (H) 11/16/2018   ESRSEDRATE 30 (H) 06/13/2018   CRP 1.0  (H) 01/01/2019   CRP 36 (H) 11/16/2018   CRP 5.7 (H) 06/13/2018   REPTSTATUS 11/28/2018 FINAL 11/23/2018   GRAMSTAIN  11/23/2018    FEW WBC PRESENT, PREDOMINANTLY PMN NO ORGANISMS SEEN    CULT  11/23/2018    RARE STAPHYLOCOCCUS AUREUS CRITICAL RESULT CALLED TO, READ BACK BY AND VERIFIED WITH: Deno Lunger RN, AT 1053 11/25/18 BY D. VANHOOK REGARDING CULTURE GROWTH NO ANAEROBES ISOLATED Performed at [facility], [place] [address] [place], [place] [address]    LABORGA STAPHYLOCOCCUS AUREUS 11/23/2018     Lab Results  Component Value Date   ALBUMIN 3.7 07/15/2019   ALBUMIN 4.0 03/20/2019   ALBUMIN 2.1 (L) 11/26/2018    Lab Results  Component Value Date   MG 1.8 11/28/2018   MG 1.7 11/27/2018   MG 2.0 11/26/2018   No results found for: VD25OH  No results found for: PREALBUMIN CBC EXTENDED Latest Ref Rng & Units 07/15/2019 03/22/2019 03/20/2019  WBC 4.0 - 10.5 K/uL 8.7 - 11.1(H)  RBC 4.22 - 5.81 MIL/uL 4.74 - 5.48  HGB 13.0 - 17.0 g/dL 10.4(L) 9.3(L) 14.3  HCT 39.0 - 52.0 % 33.5(L) 30.0(L) 45.9  PLT 150 - 400 K/uL 280 - 279  NEUTROABS 1.7 -  7.7 K/uL 5.4 - -  LYMPHSABS 0.7 - 4.0 K/uL 2.5 - -     Body mass index is 22.5 kg/m.  Orders:  Orders Placed This Encounter  Procedures  . XR Foot 2 Views Right   No orders of the defined types were placed in this encounter.    Procedures: No procedures performed  Clinical Data: No additional findings.  ROS:  All other systems negative, except as noted in the HPI. Review of Systems  Objective: Vital Signs: Ht 6\' 3"  (1.905 m)   Wt 180 lb (81.6 kg)   BMI 22.50 kg/m   Specialty Comments:  No specialty comments available.  PMFS History: Patient Active Problem List   Diagnosis Date Noted  . Malnutrition of moderate degree 03/25/2019  . 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 PICC (peripherally inserted central catheter) in place 12/21/2018  . Medication monitoring encounter 12/21/2018  .  Staphylococcus aureus bacteremia   . 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 Infected prosthetic knee joint, initial encounter ([place])   . Septic joint of right knee joint ([place]) 11/16/2018  . Hypokalemia 11/16/2018  . Anemia 11/16/2018  . Acute encephalopathy 10/31/2018  . Accidental drug overdose 10/31/2018  . Left arm weakness 10/31/2018  . History of transmetatarsal amputation of right foot ([place]) 08/30/2018  . Charcot foot due to diabetes mellitus ([place])   . Cellulitis 06/12/2018  . Diabetic foot infection ([place]) 06/22/2017  . Post op infection 06/22/2017  . SVT (supraventricular tachycardia) ([place]) 08/17/2016  . Tobacco use disorder 10/25/2014  . Diabetes mellitus type 2 in nonobese (HCC)   . Swelling   . Type 2 diabetes mellitus with left diabetic foot ulcer ([place]) 12/18/2013  . Diabetes mellitus due to underlying condition with foot ulcer ([place]) 12/18/2013  . Left leg cellulitis 12/18/2013  . Cocaine abuse ([place]) 03/29/2012  . Generalized anxiety disorder 03/29/2012  . Panic attacks 03/29/2012  . Alcohol dependence ([place]) 08/05/2011    Class: Chronic  . Substance abuse ([place]) 05/31/2011  . Ulcer of left foot, limited to breakdown of skin ([place]) 02/15/2011   Past Medical History:  Diagnosis Date  . Anemia    as a child  . Anxiety   . Arthritis    "everywhere" (04/21/2012)  . Cellulitis and abscess of foot 12/19/2013   LEFT FOOT  . Cellulitis of right foot   . Chronic pain   . Constipation   . DDD (degenerative disc disease)   . Depression    pt denies  . Diabetes mellitus without complication (HCC)    borderline  . Diabetic foot ulcer ([place])   . ETOH abuse   . Full dentures   . GERD (gastroesophageal reflux disease)    tums  . H/O amputation of lesser toe, right ([place]) 07/29/2017  . History of blood transfusion    "related to left knee OR; probably right hip too" (04/21/2012)  . History of kidney stones   . Mental disorder   . MRSA (methicillin resistant staph aureus)  culture positive   . Neuromuscular disorder (HCC)    neuropathy  . Noncompliance   . Open wound    bottom of foot  . Osteomyelitis ([place])    right 4th toe  . Osteomyelitis of right foot ([place])   . Pneumonia ~ 2012  . Polysubstance abuse (HCC)    etoh, cocaine, heroin  . Stroke [facility]) 2008   "they said I might have had one during right hip replacement" (04/21/2012)  .  Wears glasses     Family History  Problem Relation Age of Onset  . Diabetes Father     Past Surgical History:  Procedure Laterality Date  . AMPUTATION Right 06/09/2017   Procedure: RIGHT 2ND TOE AMPUTATION;  Surgeon: [name], MD;  Location: [place];  Service: Orthopedics;  Laterality: Right;  . AMPUTATION Right 07/23/2017   Procedure: RIGHT 4TH TOE AMPUTATION;  Surgeon: [name], MD;  Location: [place];  Service: Orthopedics;  Laterality: Right;  . AMPUTATION Right 06/15/2018   Procedure: RIGHT FOOT TRANSMETATARSAL AMPUTATION;  Surgeon: [name], MD;  Location: [place];  Service: Orthopedics;  Laterality: Right;  right  . EXCISIONAL TOTAL KNEE ARTHROPLASTY WITH ANTIBIOTIC SPACERS Right 11/23/2018   Procedure: REMOVAL OF RIGHT TOTAL KNEE ARTHROPLASTY AND PLACEMENT OF ANATOMIC ANTIBIOTIC SPACER;  Surgeon: [name], MD;  Location: [place];  Service: Orthopedics;  Laterality: Right;  . I & D EXTREMITY Right 11/18/2018   Procedure: RIGHT KNEE DEBRIDEMENT;  Surgeon: [name], MD;  Location: [place];  Service: Orthopedics;  Laterality: Right;  . JOINT REPLACEMENT    . KNEE ARTHROSCOPY Bilateral 1980's/1990's  . LUNG LOBECTOMY Left ~ 2006  . LUNG LOBECTOMY    . METATARSAL OSTEOTOMY  10/29/2011   Procedure: METATARSAL OSTEOTOMY;  Surgeon: [name], MD;  Location: [place];  Service: Orthopedics;  Laterality: Left;  Left 1st Metatarsal Dorsal Closing Wedge   . METATARSAL OSTEOTOMY Right 04/28/2017   Procedure: RIGHT 1ST METATARSAL DORSAL CLOSING WEDGE OSTEOTOMY;  Surgeon: [name], MD;  Location: [place];   Service: Orthopedics;  Laterality: Right;  . MULTIPLE TOOTH EXTRACTIONS    . REVISION TOTAL HIP ARTHROPLASTY Right 2008   "4-5 months after replacement" (04/21/2012)  . TOTAL HIP ARTHROPLASTY Right 2008  . TOTAL KNEE ARTHROPLASTY Left 2006  . TOTAL KNEE ARTHROPLASTY Right 04/20/2012  . TOTAL KNEE ARTHROPLASTY Right 04/20/2012   Procedure: TOTAL KNEE ARTHROPLASTY;  Surgeon: [name], MD;  Location: [place];  Service: Orthopedics;  Laterality: Right;  Right Total Knee Arthroplasty  . TOTAL KNEE REVISION Right 03/22/2019   Procedure: RIGHT TOTAL KNEE ARTHROPLASTY REVISION OF ALL COMPONENTS;  Surgeon: [name], MD;  Location: [place];  Service: Orthopedics;  Laterality: Right;   Social History   Occupational History  . Not on file  Tobacco Use  . Smoking status: Current Every Day Smoker    Packs/day: 0.50    Years: 30.00    Pack years: 15.00    Types: Cigarettes  . Smokeless tobacco: Never Used  . Tobacco comment: thinking about quitting, talking with PCP  Vaping Use  . Vaping Use: Former  Substance and Sexual Activity  . Alcohol use: Not Currently    Alcohol/week: 0.0 standard drinks  . Drug use: Not Currently    Types: Cocaine    Comment: Cocaine and heroin - 10 years ago  . Sexual activity: Yes    Birth control/protection: None

## 2020-01-30 ENCOUNTER — Other Ambulatory Visit: Payer: Self-pay | Admitting: Physician Assistant

## 2020-01-30 ENCOUNTER — Telehealth: Payer: Self-pay | Admitting: Physician Assistant

## 2020-01-30 MED ORDER — SULFAMETHOXAZOLE-TRIMETHOPRIM 800-160 MG PO TABS: 1.0000 | ORAL_TABLET | Freq: Two times a day (BID) | ORAL | 0 refills | Status: DC

## 2020-01-30 NOTE — Telephone Encounter (Signed)
Pt uses [facility] in [place] needs rx for bactrim sent in from [place] visit.

## 2020-01-30 NOTE — Telephone Encounter (Signed)
sent 

## 2020-01-30 NOTE — Telephone Encounter (Signed)
Patient called advised the Rx for Bactrim is not showing received at the pharmacy. Patient uses [facility] in [place]. The number to contact patient is [phone]

## 2020-02-17 IMAGING — CR DG FOOT COMPLETE 3+V*R*
3 series · 3 of 3 positions shown · non-contrast
Comparison: 01/08/2017

CLINICAL DATA: Right second toe wound, rule out osteomyelitis.

EXAM:
RIGHT FOOT COMPLETE - 3+ VIEW

[foot ap]
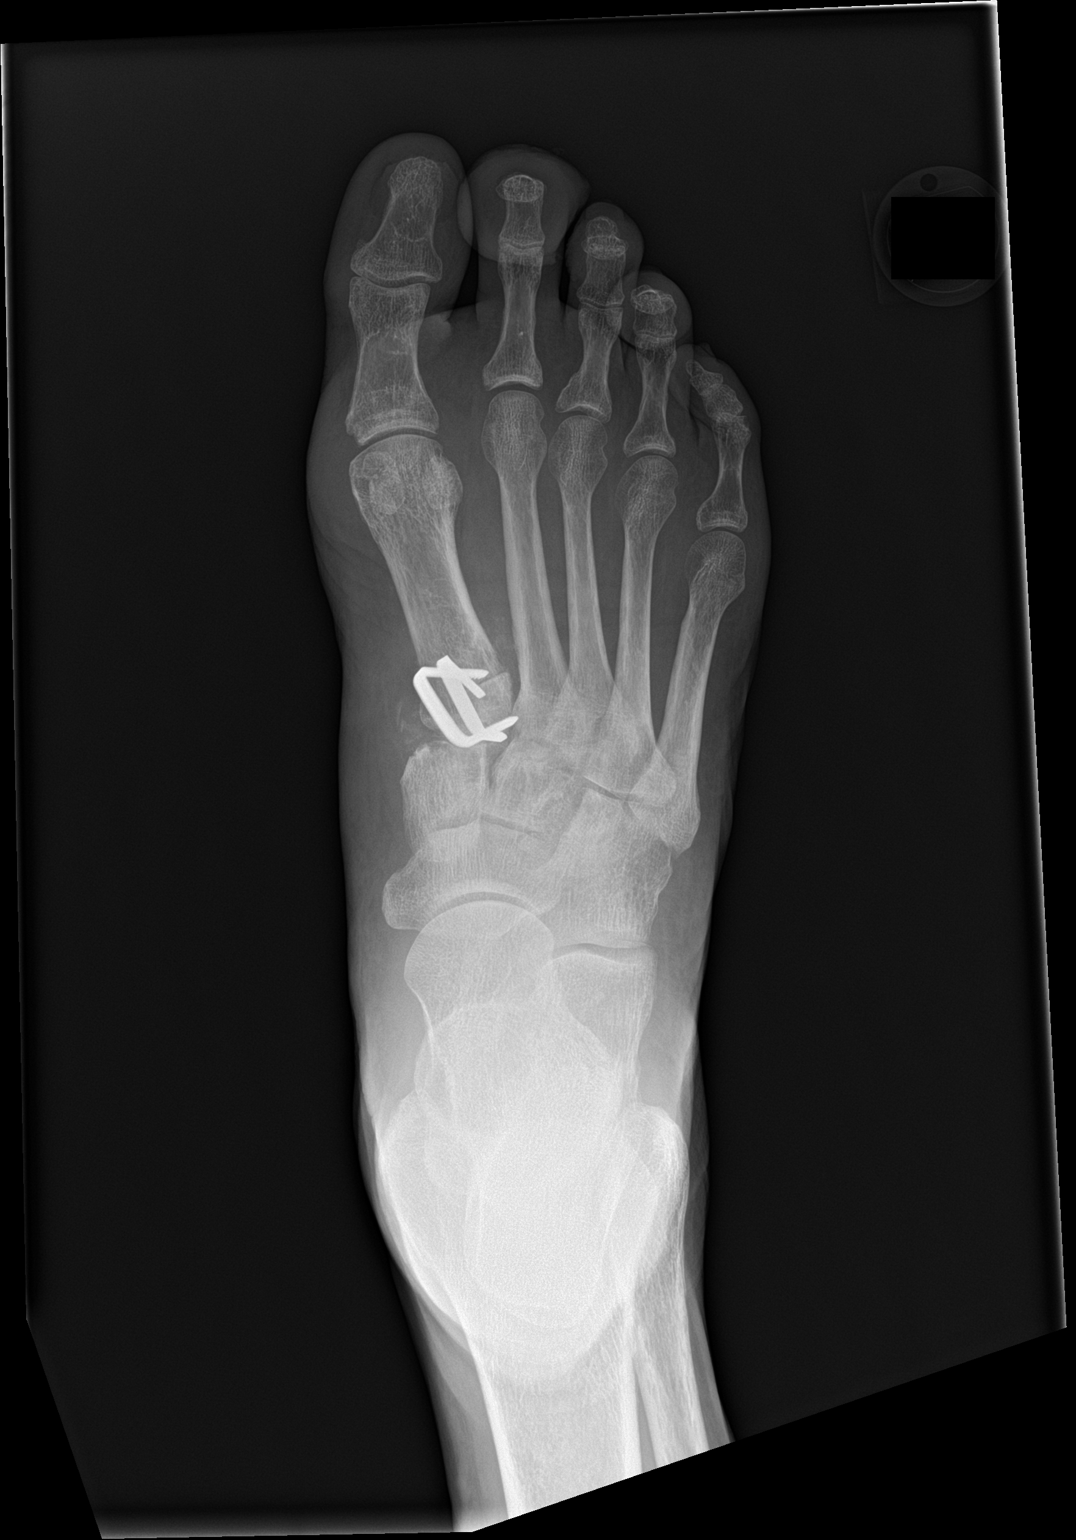

[foot obl]
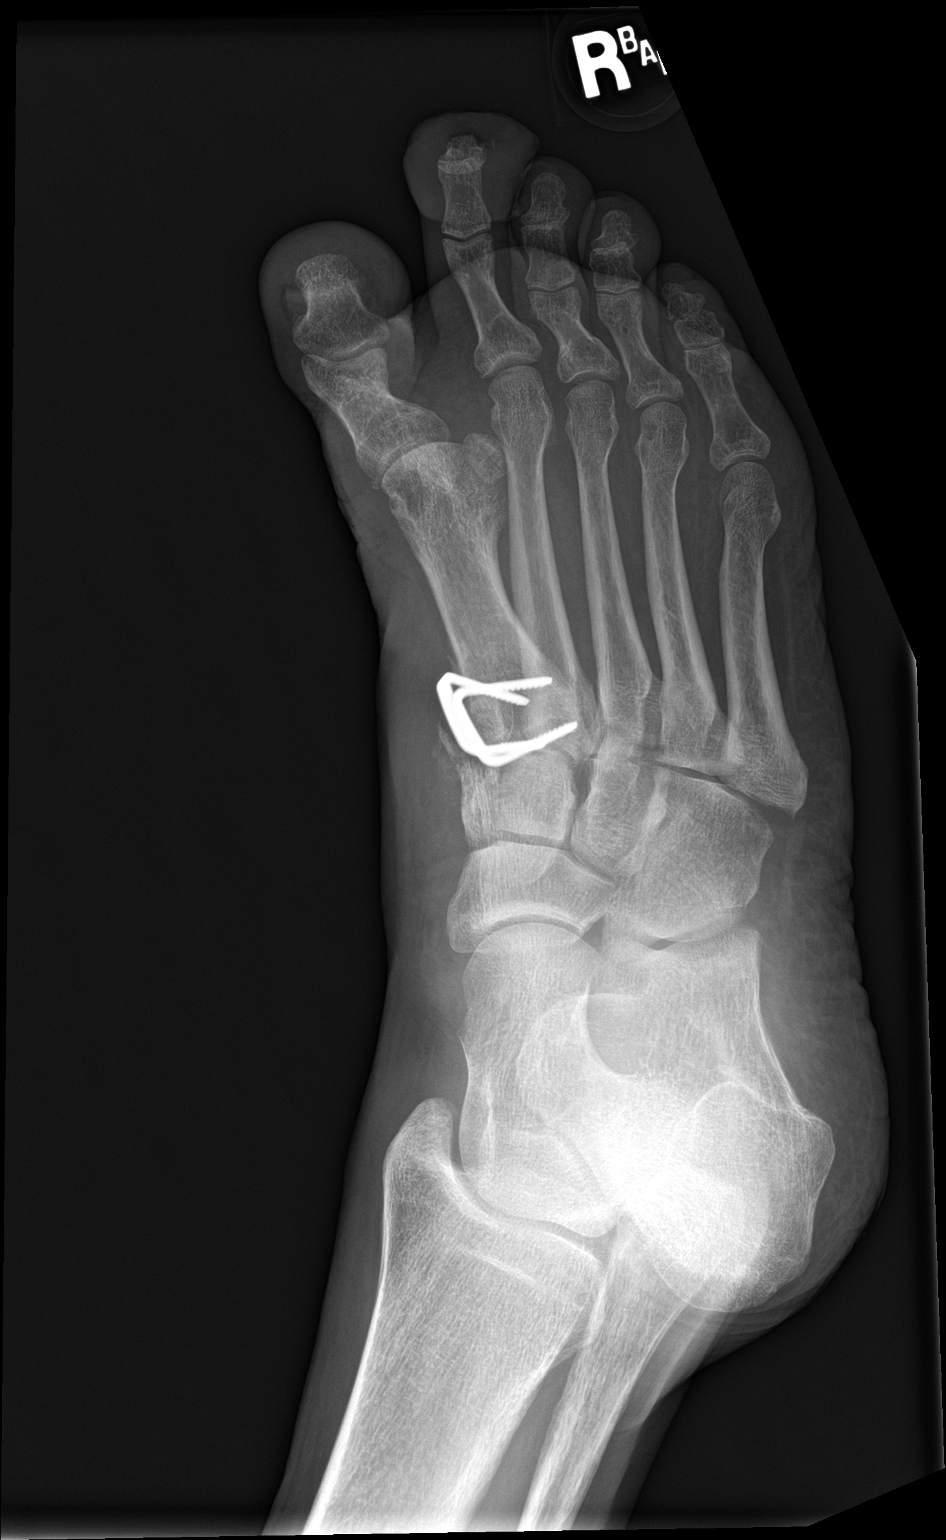

[foot lat]
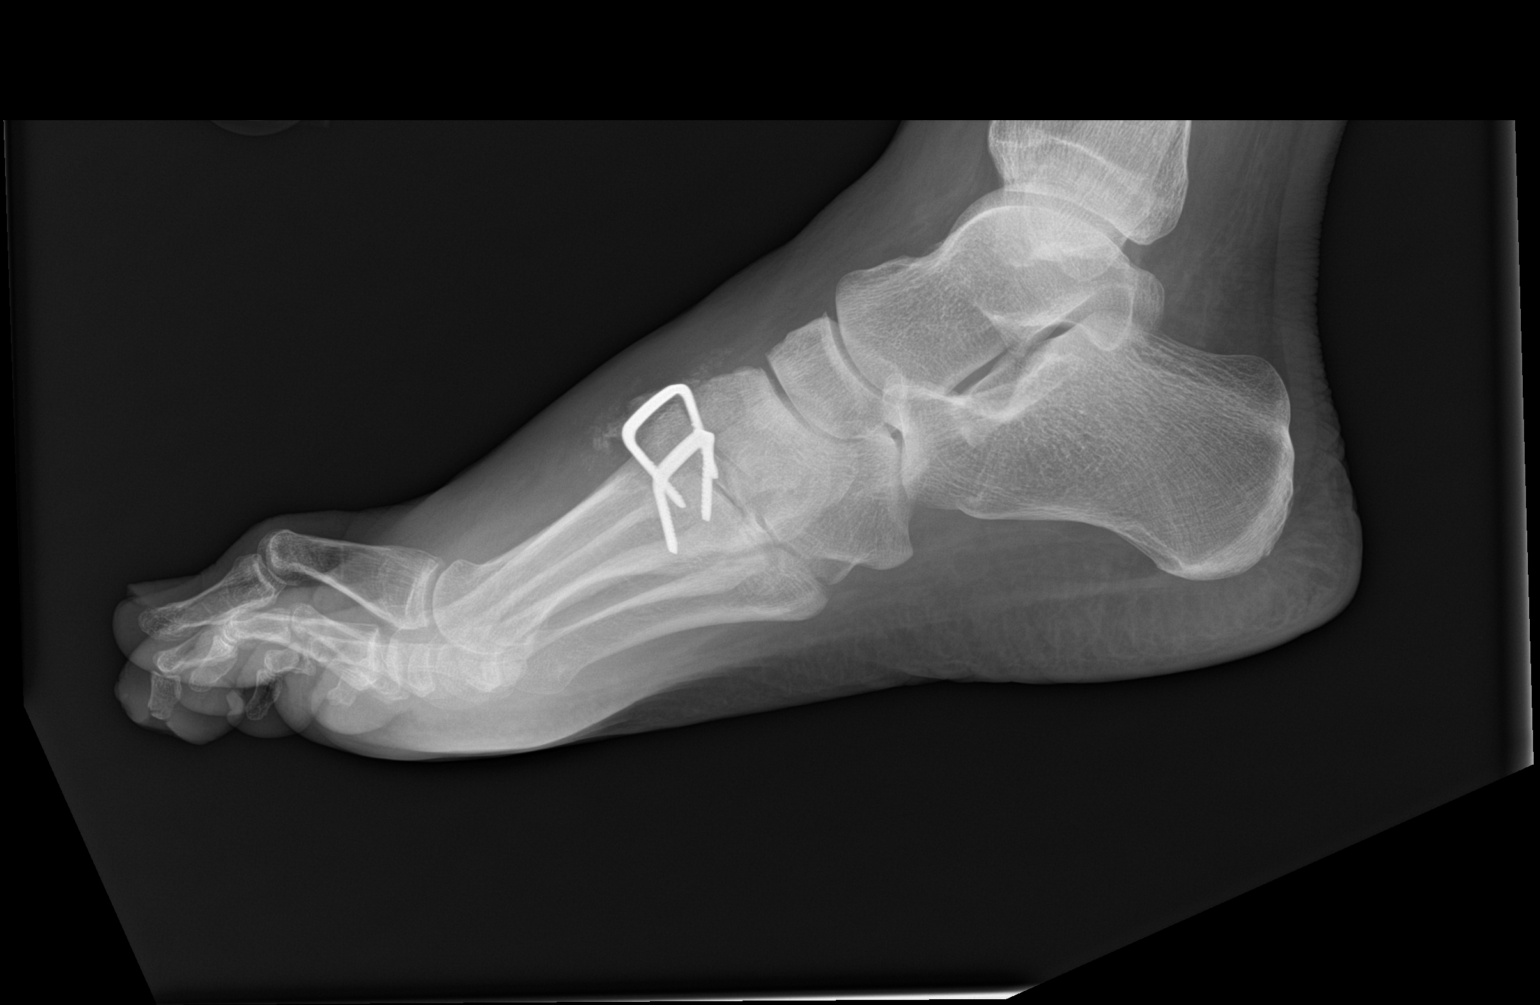

[3 of 3 positions shown; findings below may reference images not displayed]

FINDINGS: Interval osteotomy and staple fixation at the base of the first
metatarsal.

Extensive soft tissue swelling of the second toe. Deep ulcer
extending down to the distal phalanx. No evidence of osteomyelitis.
IMPRESSION: Soft tissue swelling of the second toe without osteomyelitis.

## 2020-02-26 ENCOUNTER — Ambulatory Visit: Payer: Medicare Other | Admitting: Orthopedic Surgery

## 2020-02-27 ENCOUNTER — Ambulatory Visit (INDEPENDENT_AMBULATORY_CARE_PROVIDER_SITE_OTHER): Payer: Medicare Other | Admitting: Physician Assistant

## 2020-02-27 ENCOUNTER — Encounter: Payer: Self-pay | Admitting: Orthopedic Surgery

## 2020-02-27 DIAGNOSIS — Z89431 Acquired absence of right foot: Secondary | ICD-10-CM

## 2020-02-27 NOTE — Progress Notes (Signed)
Office Visit Note   Patient: [name]           Date of Birth: May 10, 1965           MRN: [id number] Visit Date: 02/27/2020              Requested by: Lucia Gaskins, MD [address] [place],  [place] [address] PCP: Lucia Gaskins, MD  Chief Complaint  Patient presents with  . Right Foot - Follow-up  . Left Foot - Follow-up      HPI: Patient presents today for trimming of his bilateral foot ulcers.  Calluses.  He is doing very well and has no complaints he thinks things are looking slightly better  Assessment & Plan: Visit Diagnoses: No diagnosis found.  Plan: He will be able to get his new inserts and shoes hopefully next month.  Follow-up in a month or sooner if he notices any changes  Follow-Up Instructions: No follow-ups on file.   Ortho Exam  Patient is alert, oriented, no adenopathy, well-dressed, normal affect, normal respiratory effort. Right foot he does not have any open ulcers just some thickened callusing.  After obtaining verbal consent this was trimmed back to a soft surface.  He does not have any cellulitis or drainage.  On the left foot he has a 3 cm ulcer does not probe deep surrounded by some macerated skin and callus.  After obtaining verbal consent this was trimmed to a healthier surface.  He does have improved dorsiflexion at previous visits.  No sign of infection  Imaging: No results found. No images are attached to the encounter.  Labs: Lab Results  Component Value Date   HGBA1C 6.4 (H) 03/20/2019   HGBA1C 6.4 (H) 10/31/2018   HGBA1C 6.2 (H) 06/13/2018   ESRSEDRATE 20 (H) 01/01/2019   ESRSEDRATE 120 (H) 11/16/2018   ESRSEDRATE 30 (H) 06/13/2018   CRP 1.0 (H) 01/01/2019   CRP 36 (H) 11/16/2018   CRP 5.7 (H) 06/13/2018   REPTSTATUS 11/28/2018 FINAL 11/23/2018   GRAMSTAIN  11/23/2018    FEW WBC PRESENT, PREDOMINANTLY PMN NO ORGANISMS SEEN    CULT  11/23/2018    RARE STAPHYLOCOCCUS AUREUS CRITICAL RESULT CALLED TO, READ BACK BY  AND VERIFIED WITH: Deno Lunger RN, AT 1053 11/25/18 BY D. VANHOOK REGARDING CULTURE GROWTH NO ANAEROBES ISOLATED Performed at [facility], [place] [address] [place], [place] [address]    LABORGA STAPHYLOCOCCUS AUREUS 11/23/2018     Lab Results  Component Value Date   ALBUMIN 3.7 07/15/2019   ALBUMIN 4.0 03/20/2019   ALBUMIN 2.1 (L) 11/26/2018    Lab Results  Component Value Date   MG 1.8 11/28/2018   MG 1.7 11/27/2018   MG 2.0 11/26/2018   No results found for: VD25OH  No results found for: PREALBUMIN CBC EXTENDED Latest Ref Rng & Units 07/15/2019 03/22/2019 03/20/2019  WBC 4.0 - 10.5 K/uL 8.7 - 11.1(H)  RBC 4.22 - 5.81 MIL/uL 4.74 - 5.48  HGB 13.0 - 17.0 g/dL 10.4(L) 9.3(L) 14.3  HCT 39.0 - 52.0 % 33.5(L) 30.0(L) 45.9  PLT 150 - 400 K/uL 280 - 279  NEUTROABS 1.7 - 7.7 K/uL 5.4 - -  LYMPHSABS 0.7 - 4.0 K/uL 2.5 - -     There is no height or weight on file to calculate BMI.  Orders:  No orders of the defined types were placed in this encounter.  No orders of the defined types were placed in this encounter.  Procedures: No procedures performed  Clinical Data: No additional findings.  ROS:  All other systems negative, except as noted in the HPI. Review of Systems  Objective: Vital Signs: There were no vitals taken for this visit.  Specialty Comments:  No specialty comments available.  PMFS History: Patient Active Problem List   Diagnosis Date Noted  . Malnutrition of moderate degree 03/25/2019  . 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 PICC (peripherally inserted central catheter) in place 12/21/2018  . Medication monitoring encounter 12/21/2018  . Staphylococcus aureus bacteremia   . 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 Infected prosthetic knee joint, initial encounter ([place])   . Septic joint of right knee joint ([place]) 11/16/2018  . Hypokalemia 11/16/2018  . Anemia 11/16/2018  . Acute encephalopathy 10/31/2018  .  Accidental drug overdose 10/31/2018  . Left arm weakness 10/31/2018  . History of transmetatarsal amputation of right foot ([place]) 08/30/2018  . Charcot foot due to diabetes mellitus ([place])   . Cellulitis 06/12/2018  . Diabetic foot infection ([place]) 06/22/2017  . Post op infection 06/22/2017  . SVT (supraventricular tachycardia) ([place]) 08/17/2016  . Tobacco use disorder 10/25/2014  . Diabetes mellitus type 2 in nonobese (HCC)   . Swelling   . Type 2 diabetes mellitus with left diabetic foot ulcer ([place]) 12/18/2013  . Diabetes mellitus due to underlying condition with foot ulcer ([place]) 12/18/2013  . Left leg cellulitis 12/18/2013  . Cocaine abuse ([place]) 03/29/2012  . Generalized anxiety disorder 03/29/2012  . Panic attacks 03/29/2012  . Alcohol dependence ([place]) 08/05/2011    Class: Chronic  . Substance abuse ([place]) 05/31/2011  . Ulcer of left foot, limited to breakdown of skin ([place]) 02/15/2011   Past Medical History:  Diagnosis Date  . Anemia    as a child  . Anxiety   . Arthritis    "everywhere" (04/21/2012)  . Cellulitis and abscess of foot 12/19/2013   LEFT FOOT  . Cellulitis of right foot   . Chronic pain   . Constipation   . DDD (degenerative disc disease)   . Depression    pt denies  . Diabetes mellitus without complication (HCC)    borderline  . Diabetic foot ulcer ([place])   . ETOH abuse   . Full dentures   . GERD (gastroesophageal reflux disease)    tums  . H/O amputation of lesser toe, right ([place]) 07/29/2017  . History of blood transfusion    "related to left knee OR; probably right hip too" (04/21/2012)  . History of kidney stones   . Mental disorder   . MRSA (methicillin resistant staph aureus) culture positive   . Neuromuscular disorder (HCC)    neuropathy  . Noncompliance   . Open wound    bottom of foot  . Osteomyelitis ([place])    right 4th toe  . Osteomyelitis of right foot ([place])   . Pneumonia ~ 2012  . Polysubstance abuse (HCC)    etoh, cocaine, heroin  .  Stroke [facility]) 2008   "they said I might have had one during right hip replacement" (04/21/2012)  . Wears glasses     Family History  Problem Relation Age of Onset  . Diabetes Father     Past Surgical History:  Procedure Laterality Date  . AMPUTATION Right 06/09/2017   Procedure: RIGHT 2ND TOE AMPUTATION;  Surgeon: [name], MD;  Location: [facility];  Service: Orthopedics;  Laterality: Right;  . AMPUTATION Right 07/23/2017   Procedure:  RIGHT 4TH TOE AMPUTATION;  Surgeon: [name], MD;  Location: [facility];  Service: Orthopedics;  Laterality: Right;  . AMPUTATION Right 06/15/2018   Procedure: RIGHT FOOT TRANSMETATARSAL AMPUTATION;  Surgeon: [name], MD;  Location: [facility];  Service: Orthopedics;  Laterality: Right;  right  . EXCISIONAL TOTAL KNEE ARTHROPLASTY WITH ANTIBIOTIC SPACERS Right 11/23/2018   Procedure: REMOVAL OF RIGHT TOTAL KNEE ARTHROPLASTY AND PLACEMENT OF ANATOMIC ANTIBIOTIC SPACER;  Surgeon: [name], MD;  Location: [facility];  Service: Orthopedics;  Laterality: Right;  . I & D EXTREMITY Right 11/18/2018   Procedure: RIGHT KNEE DEBRIDEMENT;  Surgeon: [name], MD;  Location: [facility];  Service: Orthopedics;  Laterality: Right;  . JOINT REPLACEMENT    . KNEE ARTHROSCOPY Bilateral 1980's/1990's  . LUNG LOBECTOMY Left ~ 2006  . LUNG LOBECTOMY    . METATARSAL OSTEOTOMY  10/29/2011   Procedure: METATARSAL OSTEOTOMY;  Surgeon: [name], MD;  Location: [facility];  Service: Orthopedics;  Laterality: Left;  Left 1st Metatarsal Dorsal Closing Wedge   . METATARSAL OSTEOTOMY Right 04/28/2017   Procedure: RIGHT 1ST METATARSAL DORSAL CLOSING WEDGE OSTEOTOMY;  Surgeon: [name], MD;  Location: [facility];  Service: Orthopedics;  Laterality: Right;  . MULTIPLE TOOTH EXTRACTIONS    . REVISION TOTAL HIP ARTHROPLASTY Right 2008   "4-5 months after replacement" (04/21/2012)  . TOTAL HIP ARTHROPLASTY Right 2008  . TOTAL KNEE ARTHROPLASTY Left 2006  . TOTAL KNEE ARTHROPLASTY Right  04/20/2012  . TOTAL KNEE ARTHROPLASTY Right 04/20/2012   Procedure: TOTAL KNEE ARTHROPLASTY;  Surgeon: [name], MD;  Location: [facility];  Service: Orthopedics;  Laterality: Right;  Right Total Knee Arthroplasty  . TOTAL KNEE REVISION Right 03/22/2019   Procedure: RIGHT TOTAL KNEE ARTHROPLASTY REVISION OF ALL COMPONENTS;  Surgeon: [name], MD;  Location: [facility];  Service: Orthopedics;  Laterality: Right;   Social History   Occupational History  . Not on file  Tobacco Use  . Smoking status: Current Every Day Smoker    Packs/day: 0.50    Years: 30.00    Pack years: 15.00    Types: Cigarettes  . Smokeless tobacco: Never Used  . Tobacco comment: thinking about quitting, talking with PCP  Vaping Use  . Vaping Use: Former  Substance and Sexual Activity  . Alcohol use: Not Currently    Alcohol/week: 0.0 standard drinks  . Drug use: Not Currently    Types: Cocaine    Comment: Cocaine and heroin - 10 years ago  . Sexual activity: Yes    Birth control/protection: None

## 2020-03-06 IMAGING — MR MR FOOT*R* W/O CM
4 of 5 series · 15 of 40 positions shown · non-contrast
Comparison: Right foot x-rays from same day.

CLINICAL DATA: Diabetic foot ulcer.  Evaluate for osteomyelitis.

EXAM:
MRI OF THE RIGHT FOREFOOT WITHOUT CONTRAST
TECHNIQUE: Multiplanar, multisequence MR imaging of the right forefoot was
performed. No intravenous contrast was administered.

[Series 4: T1 · coronal · 3.0mm · 0.19mm/px · 6 of 44 slices shown (1 of 2)]
[im 1/44]
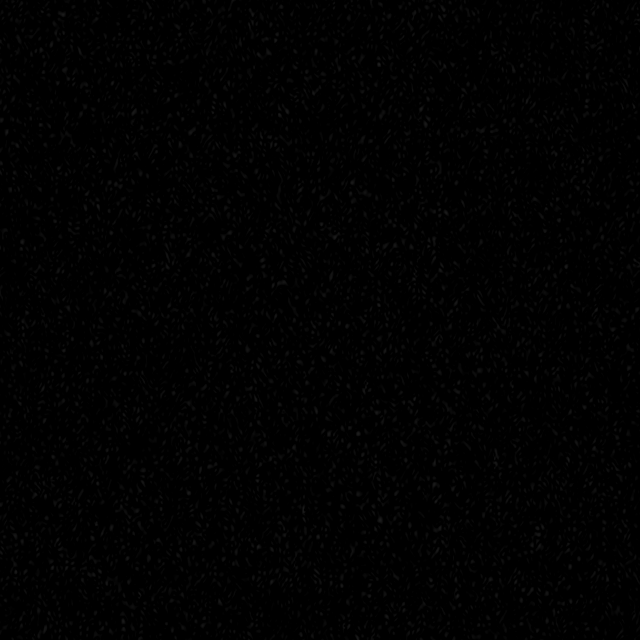
[im 8/44]
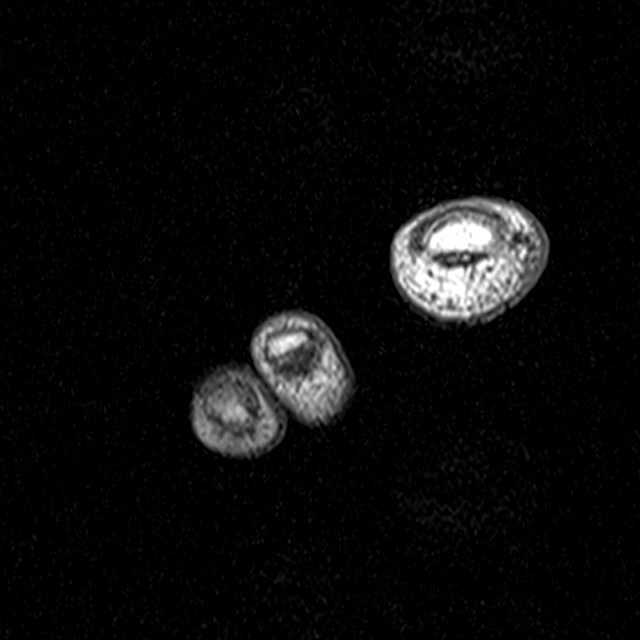
[im 15/44]
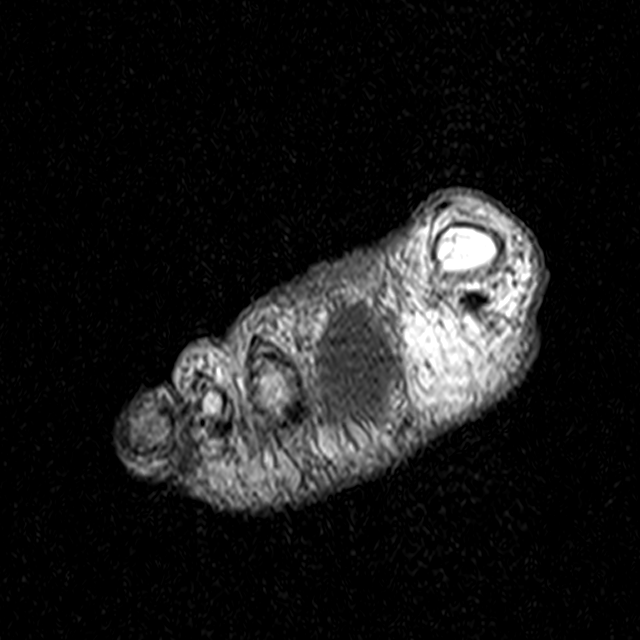
[im 18/44]
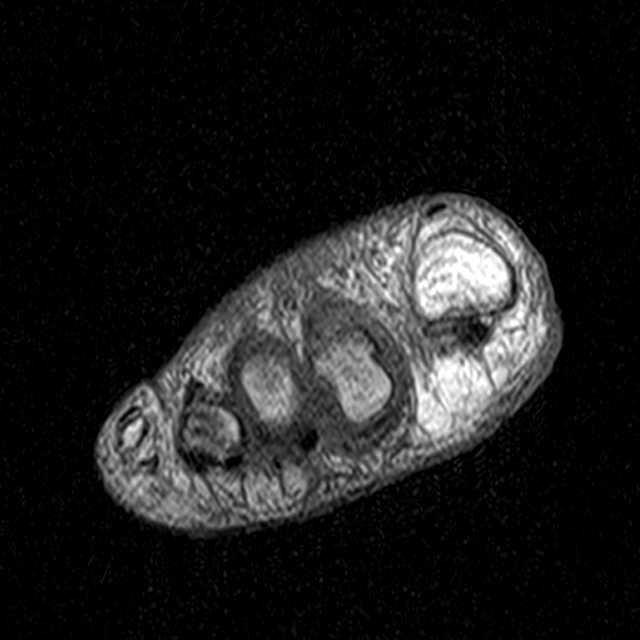
[im 22/44]
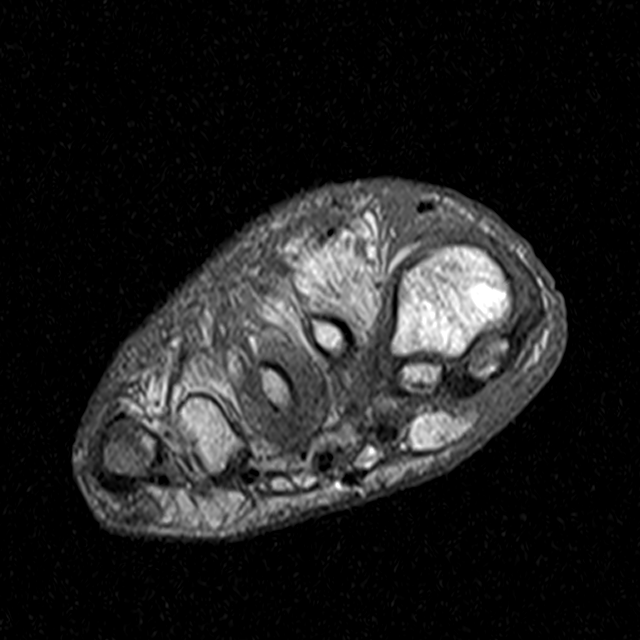
[im 36/44]
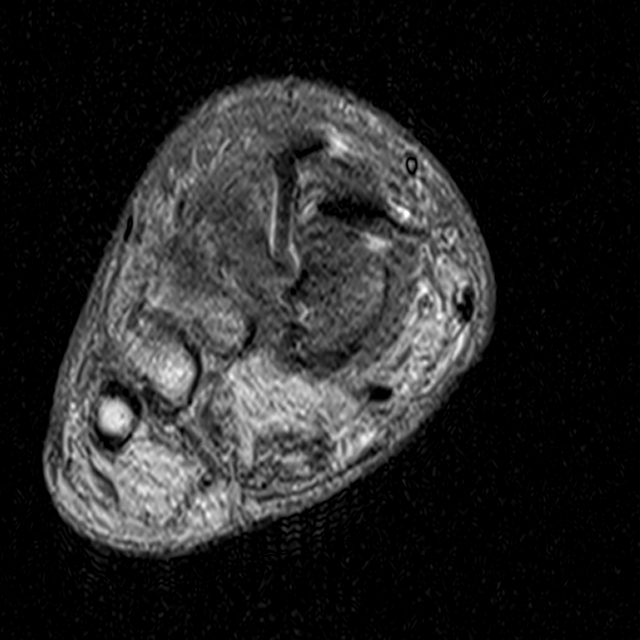

[Series 7: t2fs axial · coronal · 4.0mm · 0.25mm/px · 3 of 31 slices shown]
[im 4/31]
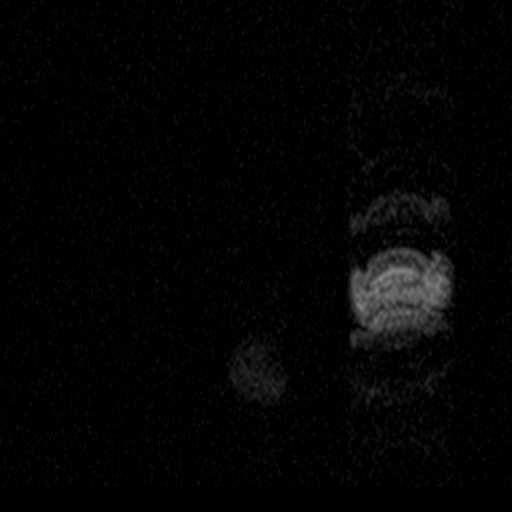
[im 16/31]
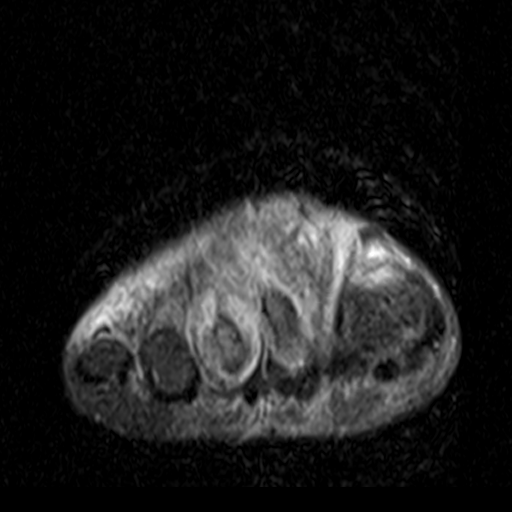
[im 27/31]
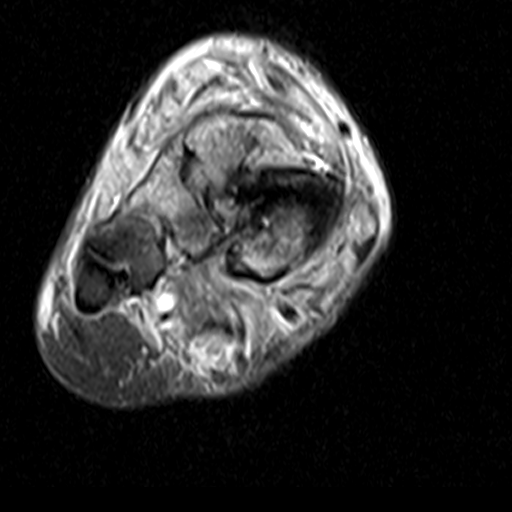

[Series 8: ir sagital · sagittal · 4.0mm · 0.39mm/px · 3 of 21 slices shown]
[im 5/21]
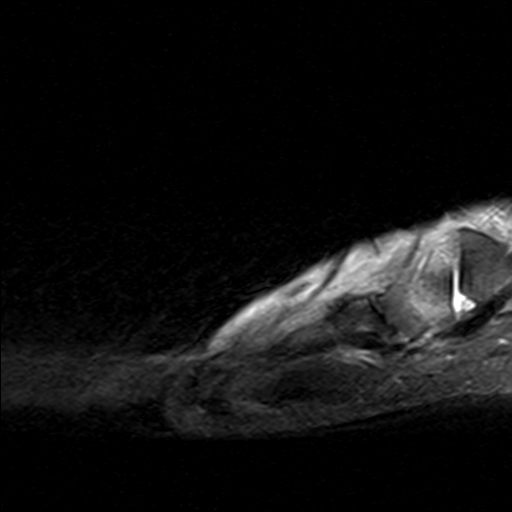
[im 13/21]
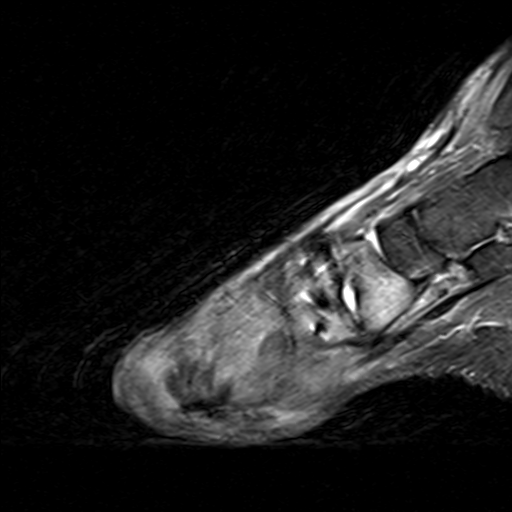
[im 21/21]
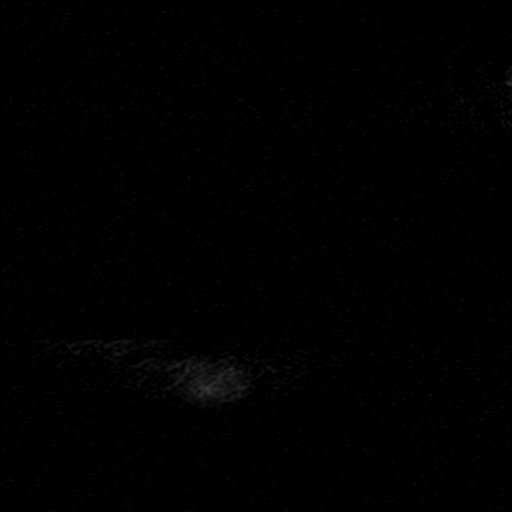

[Series 11: T1 · axial · 4.0mm · 0.30mm/px · z∈[-131,-64]mm · 3 of 22 slices shown (2 of 2)]
[im 5/22]
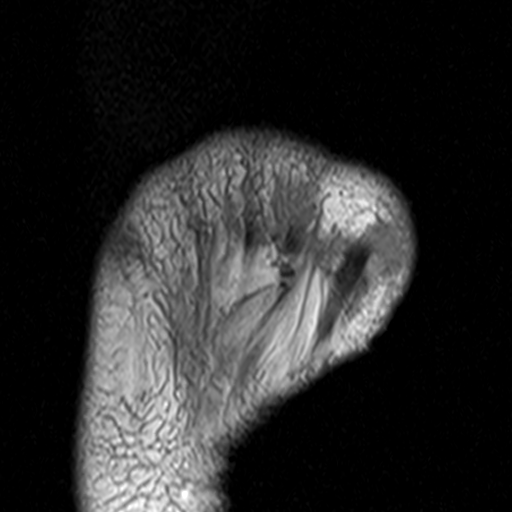
[im 13/22]
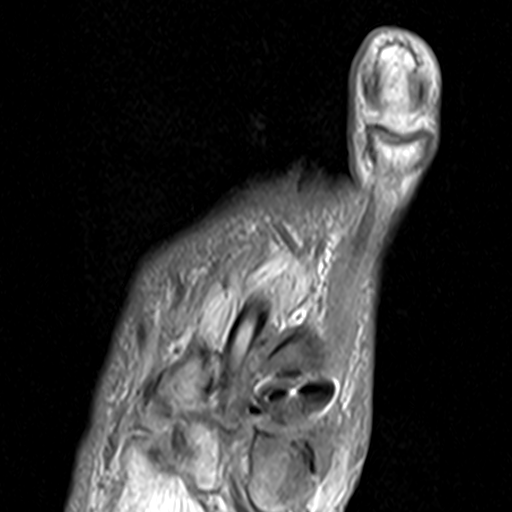
[im 22/22]
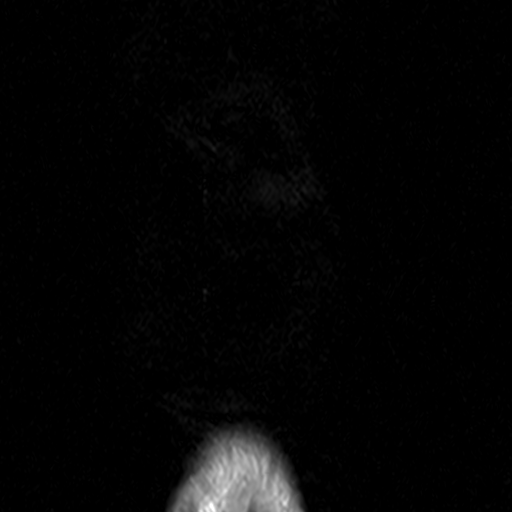

[15 of 40 positions shown; findings below may reference images not displayed]

FINDINGS: Bones/Joint/Cartilage

Postsurgical changes related to prior second toe amputation at the
MTP joint. Again seen is a mildly displaced subacute fracture of the
third metacarpal neck.

Postsurgical changes related to prior first metatarsal base
osteotomy. There is marrow edema involving the base and shaft of the
first metatarsal, base and proximal shaft of the second metatarsal,
base of the third metatarsal, and all 3 cuneiforms. There is loss of
the normal T1 marrow signal at the base of the first metatarsal, and
in the medial cuneiform. There is cortical loss along the distal
articulating aspect of the medial cuneiform.

Mild osteoarthritis of the first MTP joint.  No joint effusion.

Ligaments

Collateral ligaments are intact.

Muscles and Tendons
Flexor, peroneal and extensor compartment tendons are intact.
Diffuse fatty atrophy of the intrinsic muscles of the forefoot.

Soft tissue
Small amount of fluid at the tip of the second metatarsal head
without discrete fluid collection. No hematoma. No soft tissue mass.
Mild soft tissue edema over the dorsal forefoot. Small amount of
fluid in the third intermetatarsal bursa.
IMPRESSION: 1. Prior second toe amputation at the MTP joint with small amount of
fluid at the tip of the second metatarsal head. No discrete fluid
collection or second metatarsal osteomyelitis.
2. Postsurgical changes at the base of the first metatarsal and
medial cuneiform with prominent marrow edema and loss of the normal
T1 signal involving the base of the first metatarsal and medial
cuneiform. Given recent surgery at this site and loss of the normal
medial cuneiform cortex, findings are concerning for osteomyelitis.
Neuropathic arthropathy could have a similar appearance.
3. Additional marrow edema with preserved T1 marrow signal involving
the first metatarsal shaft, bases of the second and third
metatarsals, and middle and lateral cuneiforms could reflect
neuropathic arthropathy and/or stress related changes.
4. Subacute, mildly displaced fracture of the third metatarsal neck.

## 2020-03-25 ENCOUNTER — Ambulatory Visit: Payer: Medicare Other | Admitting: Orthopedic Surgery

## 2020-04-01 ENCOUNTER — Ambulatory Visit: Payer: Medicare Other | Admitting: Orthopedic Surgery

## 2020-07-10 ENCOUNTER — Encounter: Payer: Self-pay | Admitting: Internal Medicine

## 2020-09-05 ENCOUNTER — Encounter: Payer: Self-pay | Admitting: Internal Medicine

## 2020-09-05 ENCOUNTER — Ambulatory Visit: Payer: Medicare Other | Admitting: Internal Medicine

## 2021-02-25 IMAGING — MR MRI OF THE LEFT FOOT WITHOUT CONTRAST
7 series · 40 of 40 positions shown · non-contrast
Comparison: Radiographs 06/08/2018.

CLINICAL DATA: Left forefoot diabetic ulcer.

EXAM:
MRI OF THE LEFT FOOT WITHOUT CONTRAST
TECHNIQUE: Multiplanar, multisequence MR imaging of the left forefoot was
performed. No intravenous contrast was administered.

[Series 4: T1 · coronal · left · 3.0mm · 0.47mm/px · 6 of 56 slices shown (1 of 2)]
[im 1/56]
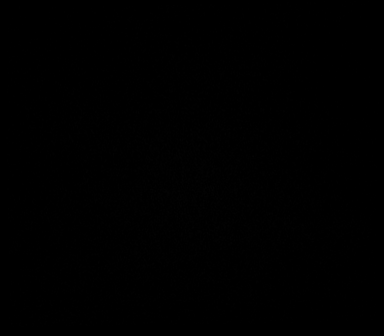
[im 12/56]
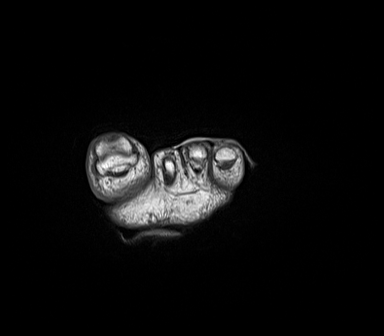
[im 23/56]
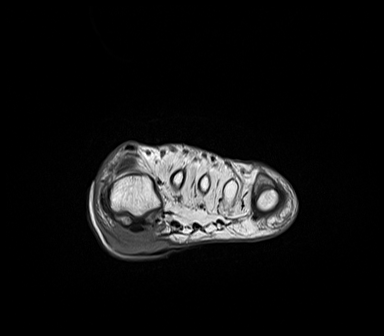
[im 34/56]
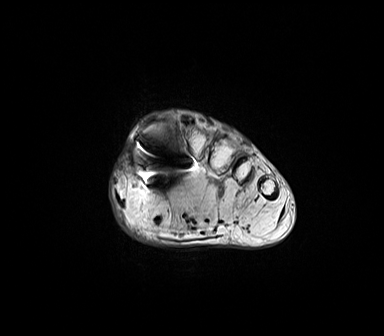
[im 45/56]
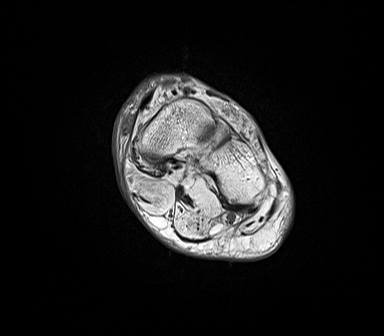
[im 56/56]
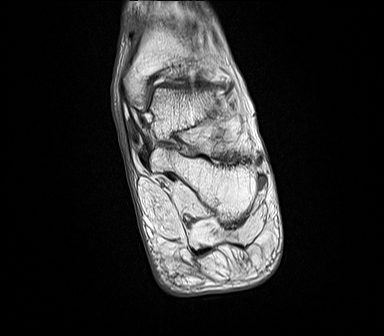

[Series 5: T2 fat-sat · coronal · left · 3.0mm · 0.49mm/px · 6 of 56 slices shown (1 of 2)]
[im 1/56]
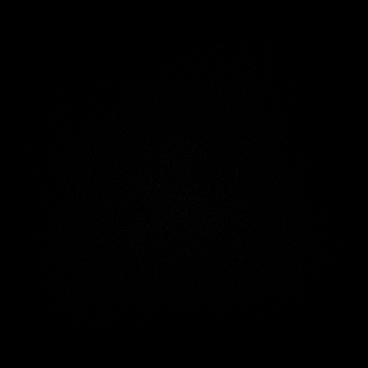
[im 12/56]
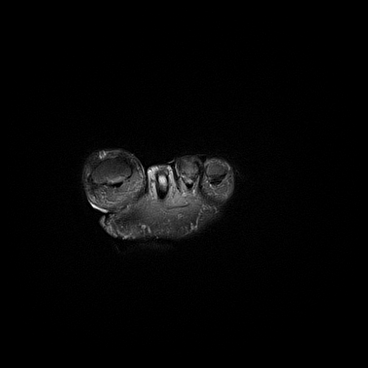
[im 23/56]
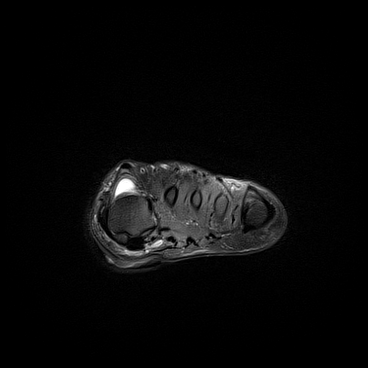
[im 34/56]
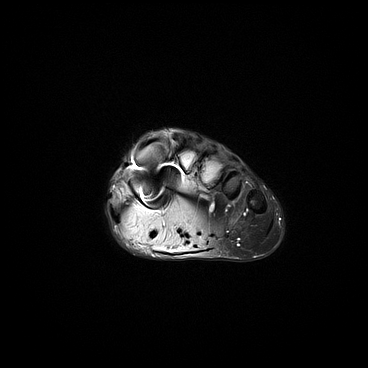
[im 45/56]
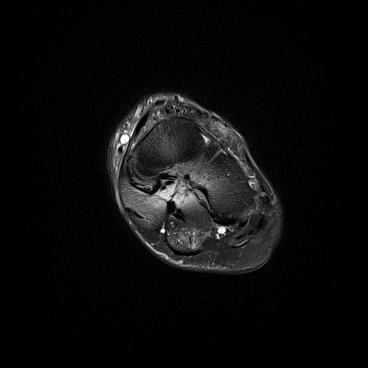
[im 56/56]
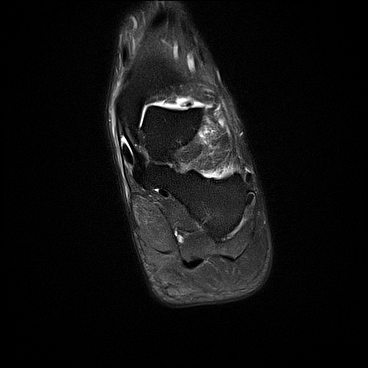

[Series 6: T1 · axial · left · 3.0mm · 0.52mm/px · z∈[-107,+73]mm · 6 of 48 slices shown (2 of 2)]
[im 1/48]
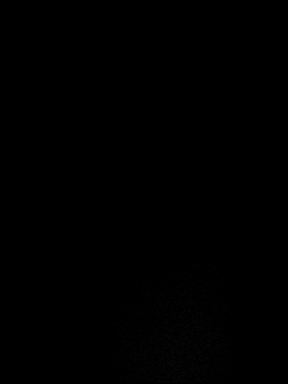
[im 10/48]
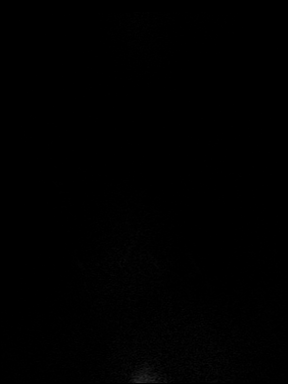
[im 19/48]
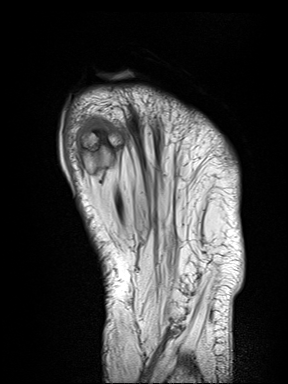
[im 29/48]
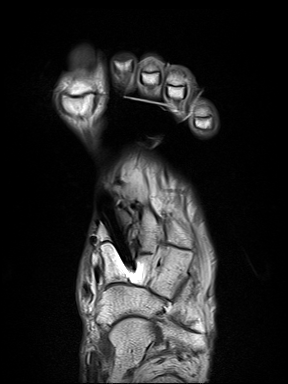
[im 38/48]
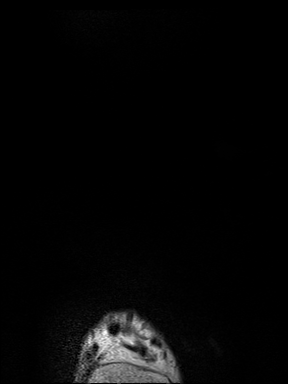
[im 48/48]
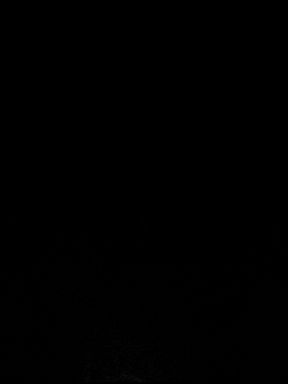

[Series 7: T2 fat-sat · axial · left · 3.0mm · 0.60mm/px · z∈[-107,+73]mm · 6 of 48 slices shown (2 of 2)]
[im 1/48]
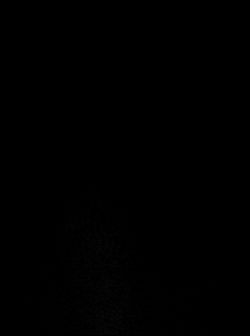
[im 10/48]
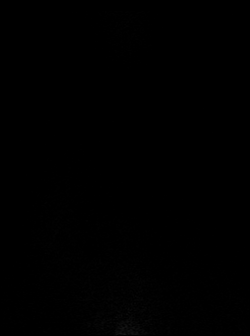
[im 19/48]
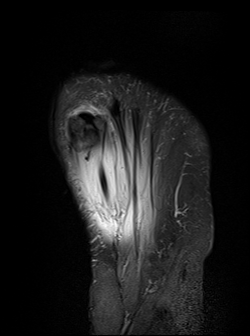
[im 29/48]
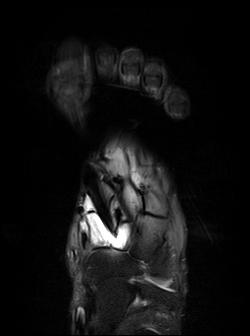
[im 38/48]
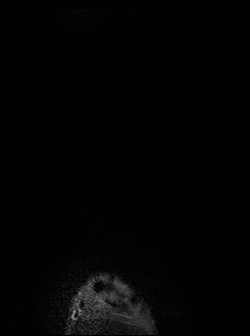
[im 48/48]
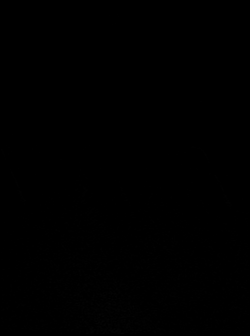

[Series 8: STIR · sagittal · left · 3.0mm · 0.70mm/px · 4 of 33 slices shown (1 of 3)]
[im 1/33]
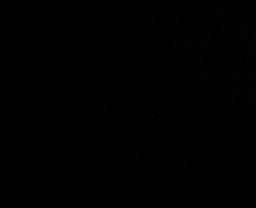
[im 11/33]
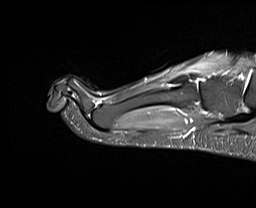
[im 22/33]
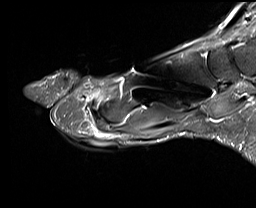
[im 33/33]
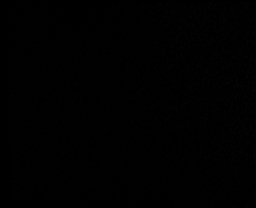

[Series 9: STIR · axial · left · 3.0mm · 0.78mm/px · z∈[-96,+73]mm · 5 of 44 slices shown (2 of 3)]
[im 1/44]
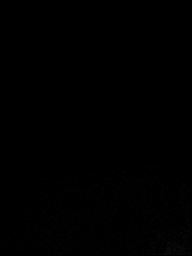
[im 11/44]
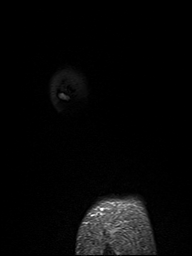
[im 22/44]
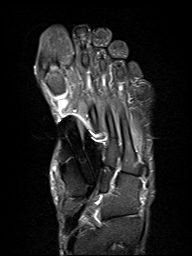
[im 33/44]
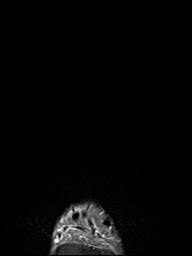
[im 44/44]
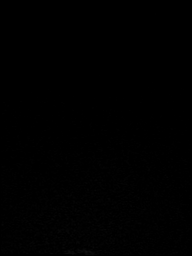

[Series 10: STIR · coronal · left · 3.0mm · 0.70mm/px · 7 of 56 slices shown (3 of 3)]
[im 1/56]
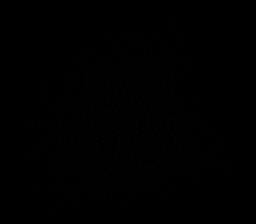
[im 10/56]
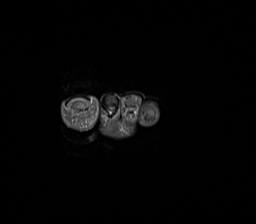
[im 19/56]
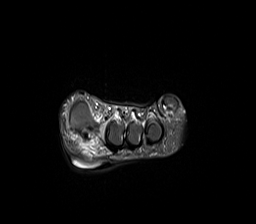
[im 28/56]
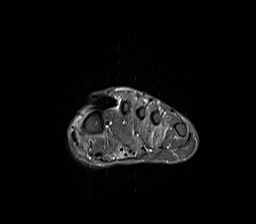
[im 37/56]
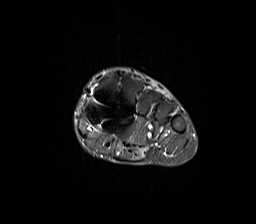
[im 46/56]
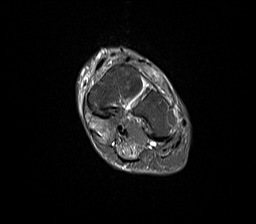
[im 56/56]
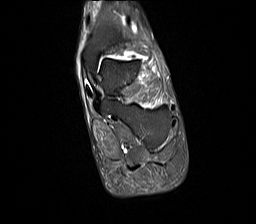

[40 of 40 positions shown; findings below may reference images not displayed]

FINDINGS: Bones/Joint/Cartilage

Additional sequences were obtained to minimize artifact from the
screw traversing the 1st tarsometatarsal articulation. There is no
evidence of acute fracture or dislocation. There is no suspicious
bone marrow edema, cortical destruction or erosive change. A small
effusion of the 1st metatarsophalangeal joint is present. As above,
there are postsurgical changes at the 1st tarsal metatarsal
articulation. There is no subluxation or significant arthropathy at
the Lisfranc joint. The visualized tarsal bones appear normal.

Ligaments

The Lisfranc ligament is partially obscured by the artifact,
although appears intact. The collateral ligaments of the
metatarsophalangeal joints appear intact.

Muscles and Tendons

Severe diffuse muscular fatty atrophy throughout the forefoot. The
forefoot tendons are intact. No significant tenosynovitis.

Soft tissues

There is a soft tissue ulcer plantar to the 1st metatarsophalangeal
joint which is best seen on the coronal images, measuring up to 12
mm in diameter. Between this ulcer and the flexor hallucis longus
tendon, there are signal changes within the subcutaneous fat,
manifesting as decreased T1 signal and heterogeneously increased T2
signal, most consistent with phlegmon. There is no well-defined
fluid collection to suggest drainable abscess. As above, there is a
small effusion of the 1st metatarsophalangeal joint, but no evidence
of osteomyelitis involving the 1st metatarsal head, it sesamoids or
the great toe. No foreign bodies are identified.
IMPRESSION: 1. Soft tissue ulcer plantar to the 1st metatarsophalangeal joint
with adjacent phlegmon. No drainable abscess.
2. Small effusion of the 1st metatarsophalangeal joint without
specific evidence of septic arthritis or osteomyelitis.
3. Postsurgical changes at the 1st tarsometatarsal articulation.
4. Severe forefoot muscular fatty atrophy.

## 2021-02-25 IMAGING — MR MRI OF THE RIGHT FOREFOOT WITHOUT CONTRAST
5 series · 39 of 40 positions shown · non-contrast
Comparison: None.
COMPARISON: None.

Addendum:
CLINICAL DATA: Diabetic foot ulcer, chronic pain, prior fourth toe
amputation

EXAM:
MRI OF THE RIGHT FOREFOOT WITHOUT CONTRAST
TECHNIQUE: Multiplanar, multisequence MR imaging of the right forefoot was
performed. No intravenous contrast was administered.

[Series 4: T1 · coronal · right · 3.0mm · 0.47mm/px · 9 of 54 slices shown (1 of 2)]
[im 1/54]
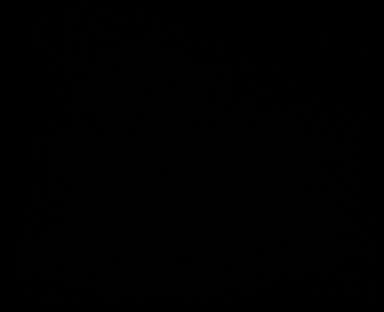
[im 7/54]
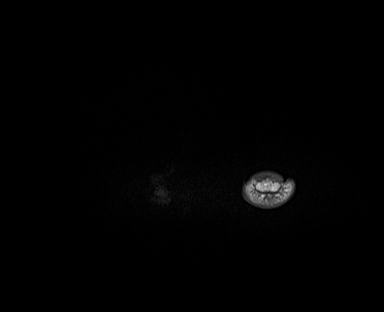
[im 14/54]
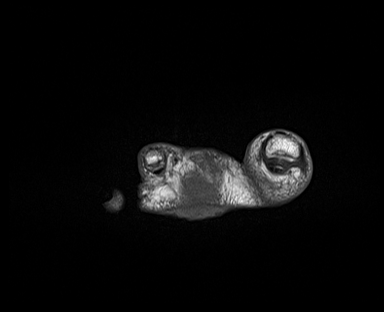
[im 20/54]
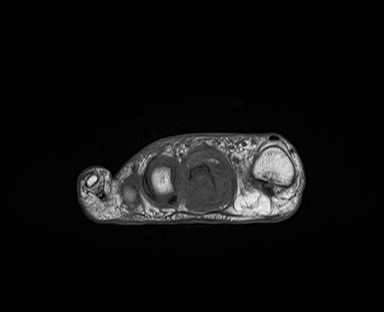
[im 27/54]
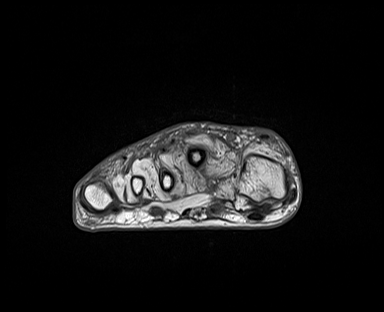
[im 34/54]
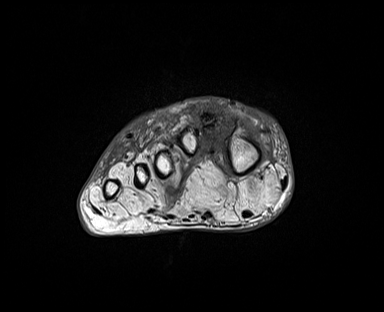
[im 40/54]
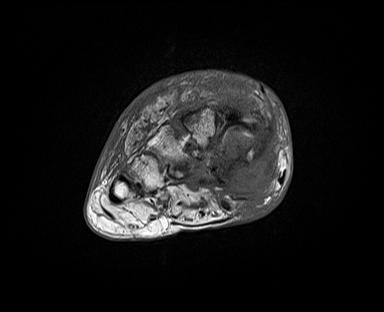
[im 47/54]
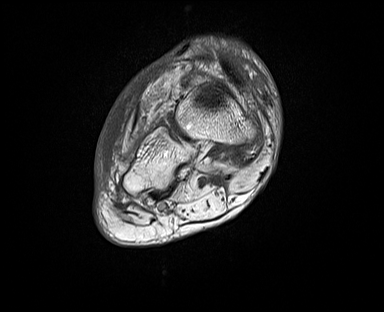
[im 54/54]
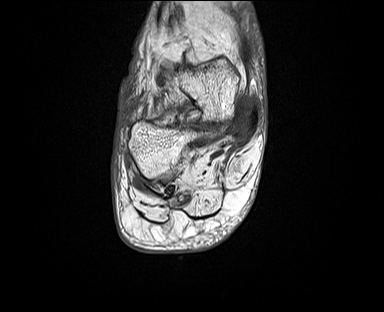

[Series 5: T2 fat-sat · coronal · right · 3.0mm · 0.49mm/px · 9 of 54 slices shown (1 of 2)]
[im 1/54]
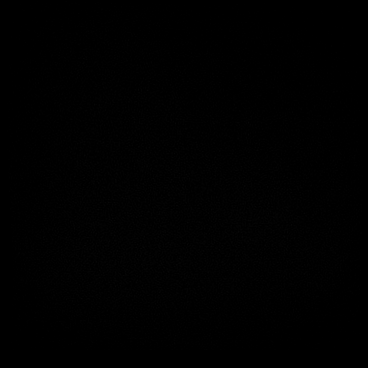
[im 7/54]
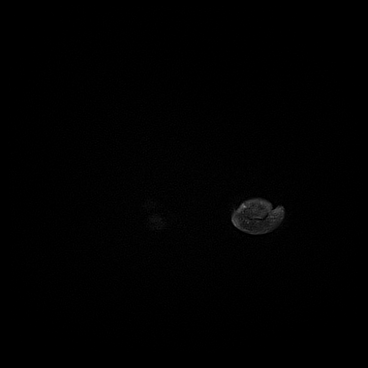
[im 14/54]
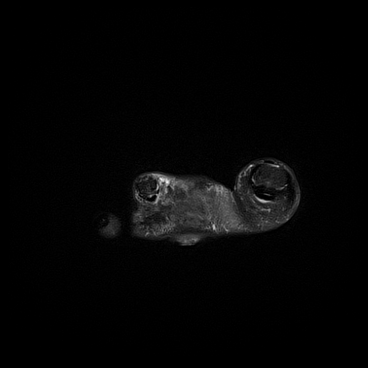
[im 20/54]
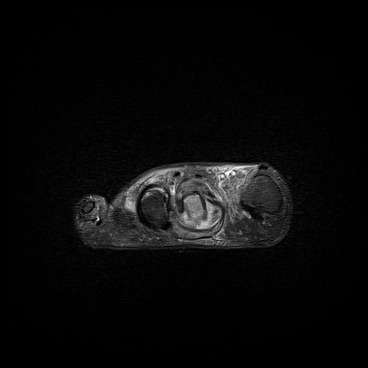
[im 27/54]
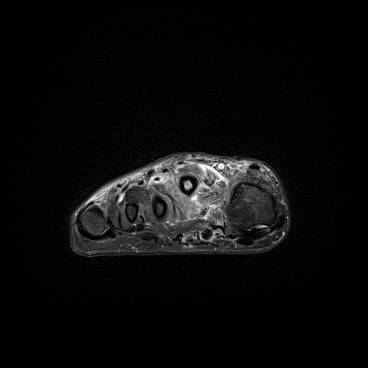
[im 34/54]
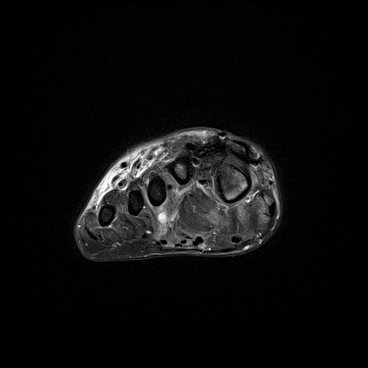
[im 40/54]
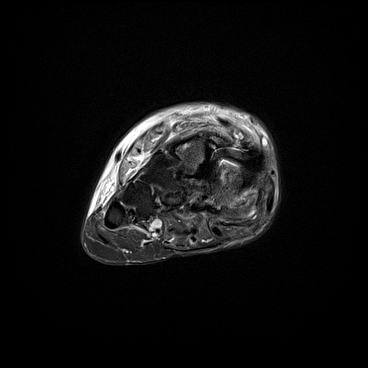
[im 47/54]
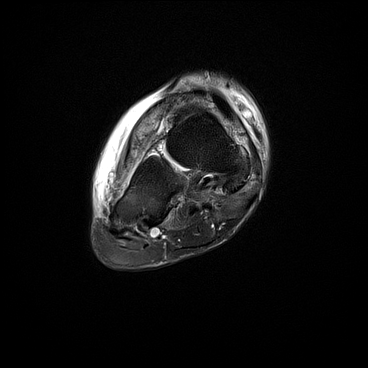
[im 54/54]
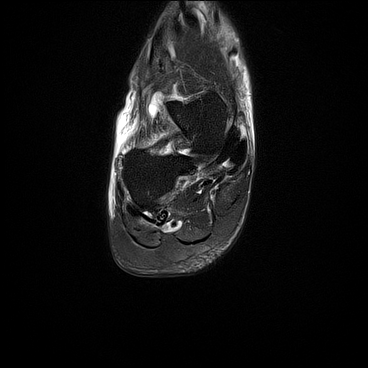

[Series 6: T1 · axial · right · 3.0mm · 0.52mm/px · z∈[-139,+46]mm · 8 of 48 slices shown (2 of 2)]
[im 1/48]
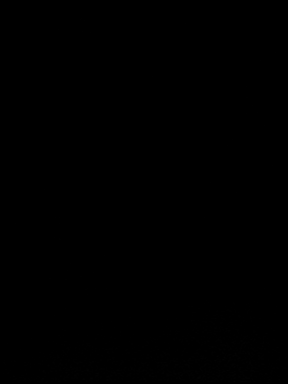
[im 7/48]
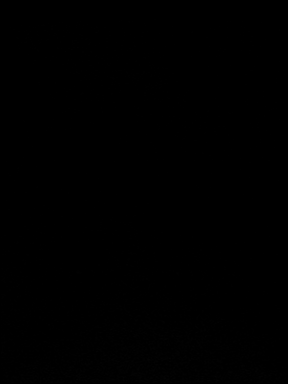
[im 14/48]
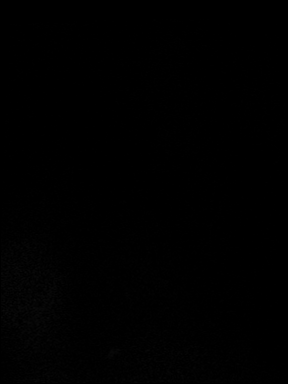
[im 21/48]
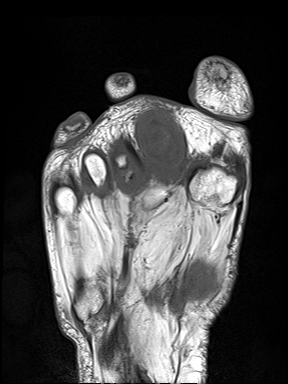
[im 27/48]
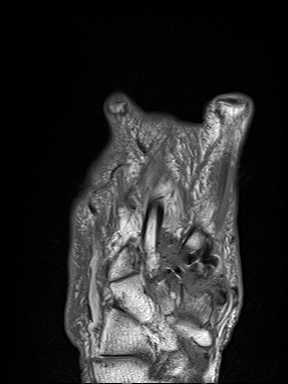
[im 34/48]
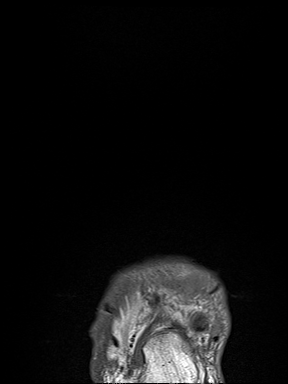
[im 41/48]
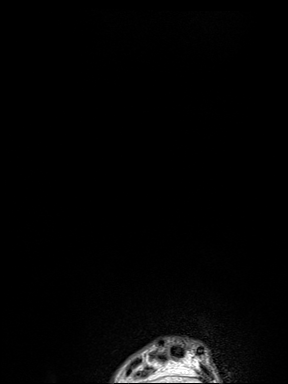
[im 48/48]
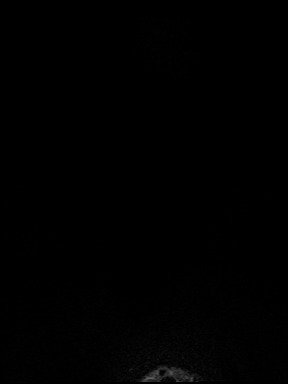

[Series 7: T2 fat-sat · axial · right · 3.0mm · 0.60mm/px · z∈[-139,+46]mm · 8 of 47 slices shown (2 of 2)]
[im 1/47]
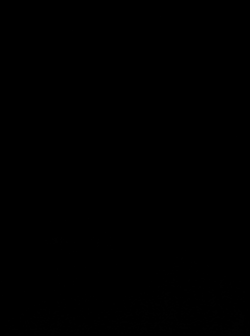
[im 7/47]
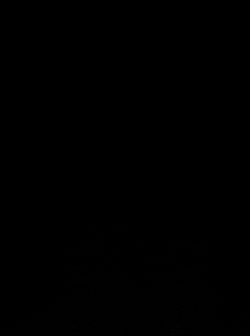
[im 14/47]
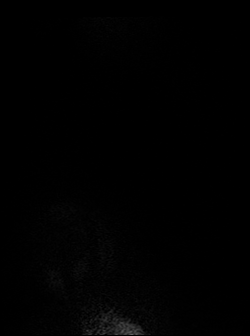
[im 20/47]
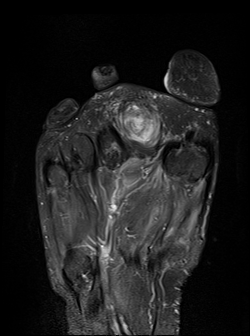
[im 27/47]
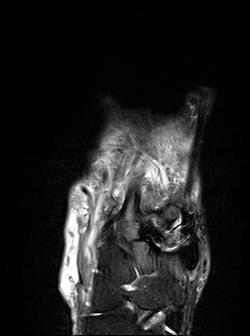
[im 33/47]
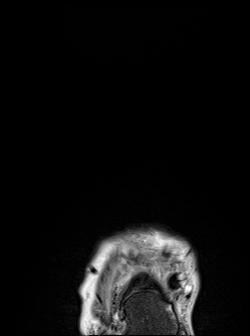
[im 40/47]
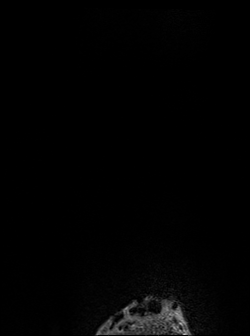
[im 47/47]
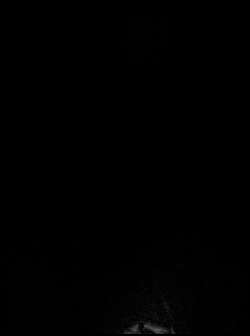

[Series 8: STIR · sagittal · right · 3.0mm · 0.70mm/px · 5 of 39 slices shown]
[im 1/39]
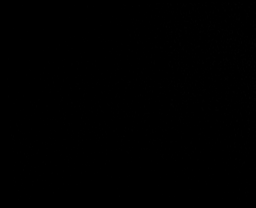
[im 8/39]
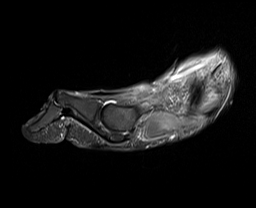
[im 16/39]
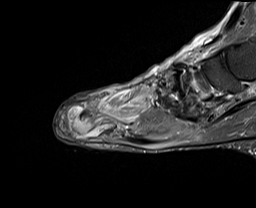
[im 23/39]
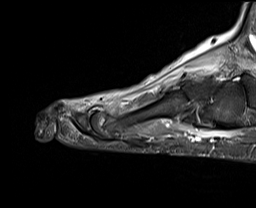
[im 31/39]
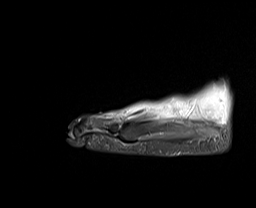

[39 of 40 positions shown; findings below may reference images not displayed]

FINDINGS: Bones/Joint/Cartilage

Prior amputation of the second and fourth toes.

Soft tissue ulcer along the plantar aspect of the second metatarsal
head extending down to the cortical surface of the second metatarsal
head. Cortical destruction of the plantar aspect of the second
metatarsal head with severe marrow edema throughout the metatarsal
head and shaft most consistent with osteomyelitis. Heterogeneous T2
hyperintense signal around the first metatarsal head measuring
approximately 8 mm in thickness circumferentially concerning for
abscess versus phlegmon.

Prior Lisfranc fracture subluxation with prior ORIF. Advanced
osteoarthritis of the first tarsometatarsal joint secondary to
instability with subchondral marrow edema. Lateral subluxation of
the second metatarsal base relative to the middle cuneiform. Mild
osteoarthritis of the second and third TMT joints. No acute fracture
or dislocation. Mild osteoarthritis of the first MTP joint.

Ligaments

Collateral ligaments are intact.

Muscles and Tendons
Flexor, peroneal and extensor compartment tendons are intact.
Muscles are normal.

Soft tissue
No fluid collection or hematoma. No soft tissue mass. Soft tissue
edema along the dorsal aspect of the forefoot.
IMPRESSION: 1. Soft tissue ulcer along the plantar aspect of the second
metatarsal head. Severe osteomyelitis of the second metatarsal head.
Heterogeneous T2 hyperintense signal around the first metatarsal
head measuring approximately 8 mm in thickness circumferentially
concerning for abscess versus phlegmon.
2. Prior Lisfranc fracture subluxation with prior ORIF. Advanced
osteoarthritis of the first tarsometatarsal joint secondary to
instability with subchondral marrow edema.

ADDENDUM:
Typographical error in the findings and impression sections.
Correction is as follows

Soft tissue ulcer along the plantar aspect of the second metatarsal
head. Severe osteomyelitis of the second metatarsal head.
Heterogeneous T2 hyperintense signal around the second metatarsal

head measuring approximately 8 mm in thickness circumferentially

concerning for abscess versus phlegmon.

*** End of Addendum ***
FINDINGS: Bones/Joint/Cartilage

Prior amputation of the second and fourth toes.

Soft tissue ulcer along the plantar aspect of the second metatarsal
head extending down to the cortical surface of the second metatarsal
head. Cortical destruction of the plantar aspect of the second
metatarsal head with severe marrow edema throughout the metatarsal
head and shaft most consistent with osteomyelitis. Heterogeneous T2
hyperintense signal around the first metatarsal head measuring
approximately 8 mm in thickness circumferentially concerning for
abscess versus phlegmon.

Prior Lisfranc fracture subluxation with prior ORIF. Advanced
osteoarthritis of the first tarsometatarsal joint secondary to
instability with subchondral marrow edema. Lateral subluxation of
the second metatarsal base relative to the middle cuneiform. Mild
osteoarthritis of the second and third TMT joints. No acute fracture
or dislocation. Mild osteoarthritis of the first MTP joint.

Ligaments

Collateral ligaments are intact.

Muscles and Tendons
Flexor, peroneal and extensor compartment tendons are intact.
Muscles are normal.

Soft tissue
No fluid collection or hematoma. No soft tissue mass. Soft tissue
edema along the dorsal aspect of the forefoot.
IMPRESSION: 1. Soft tissue ulcer along the plantar aspect of the second
metatarsal head. Severe osteomyelitis of the second metatarsal head.
Heterogeneous T2 hyperintense signal around the first metatarsal
head measuring approximately 8 mm in thickness circumferentially
concerning for abscess versus phlegmon.
2. Prior Lisfranc fracture subluxation with prior ORIF. Advanced
osteoarthritis of the first tarsometatarsal joint secondary to
instability with subchondral marrow edema.

## 2021-04-11 IMAGING — DX DG KNEE COMPLETE 4+V*R*
4 series · 4 of 4 positions shown · non-contrast
Comparison: None.

CLINICAL DATA: 49-year-old male with right knee pain.

EXAM:
RIGHT KNEE - COMPLETE 4+ VIEW

[knee ap]
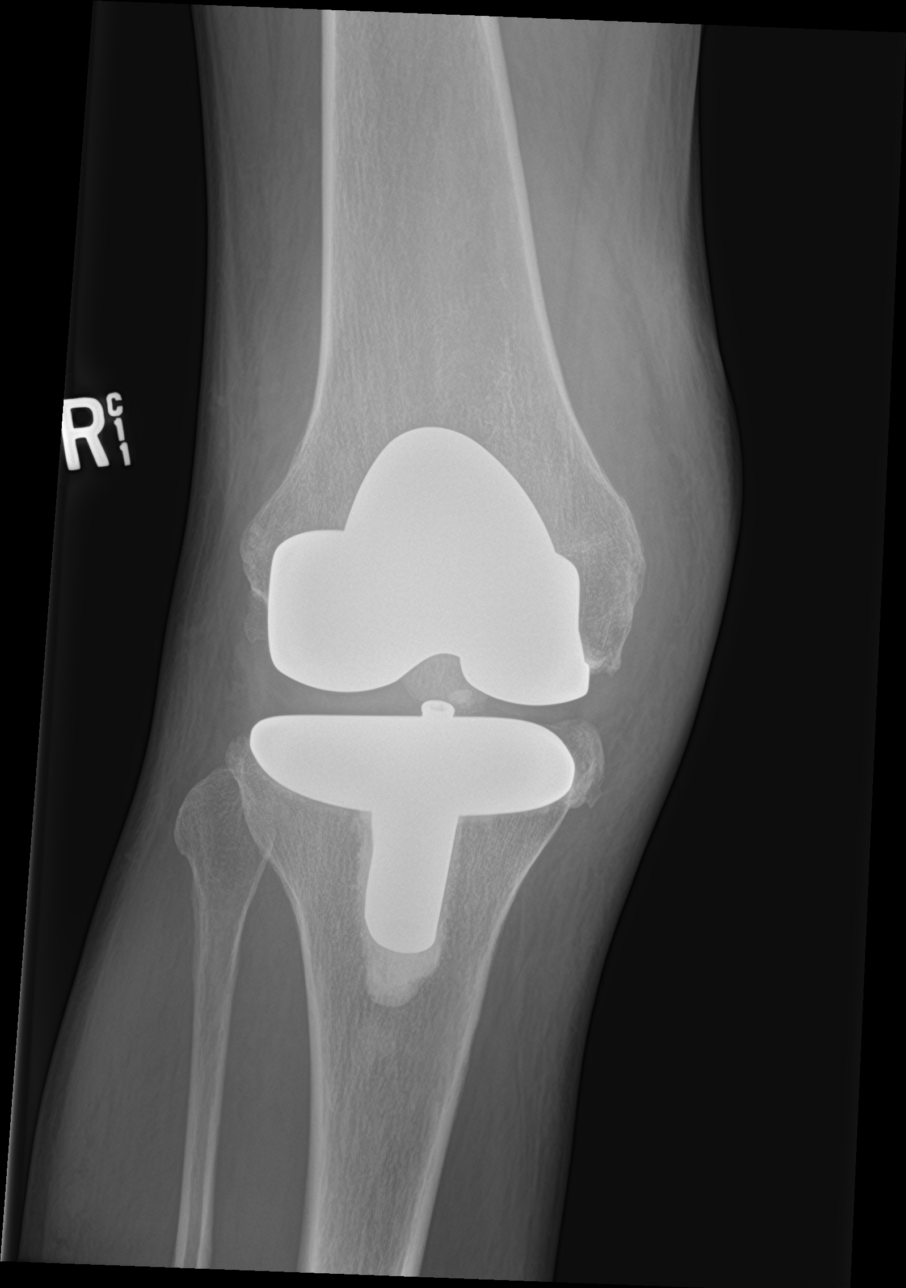

[knee obl (1 of 2)]
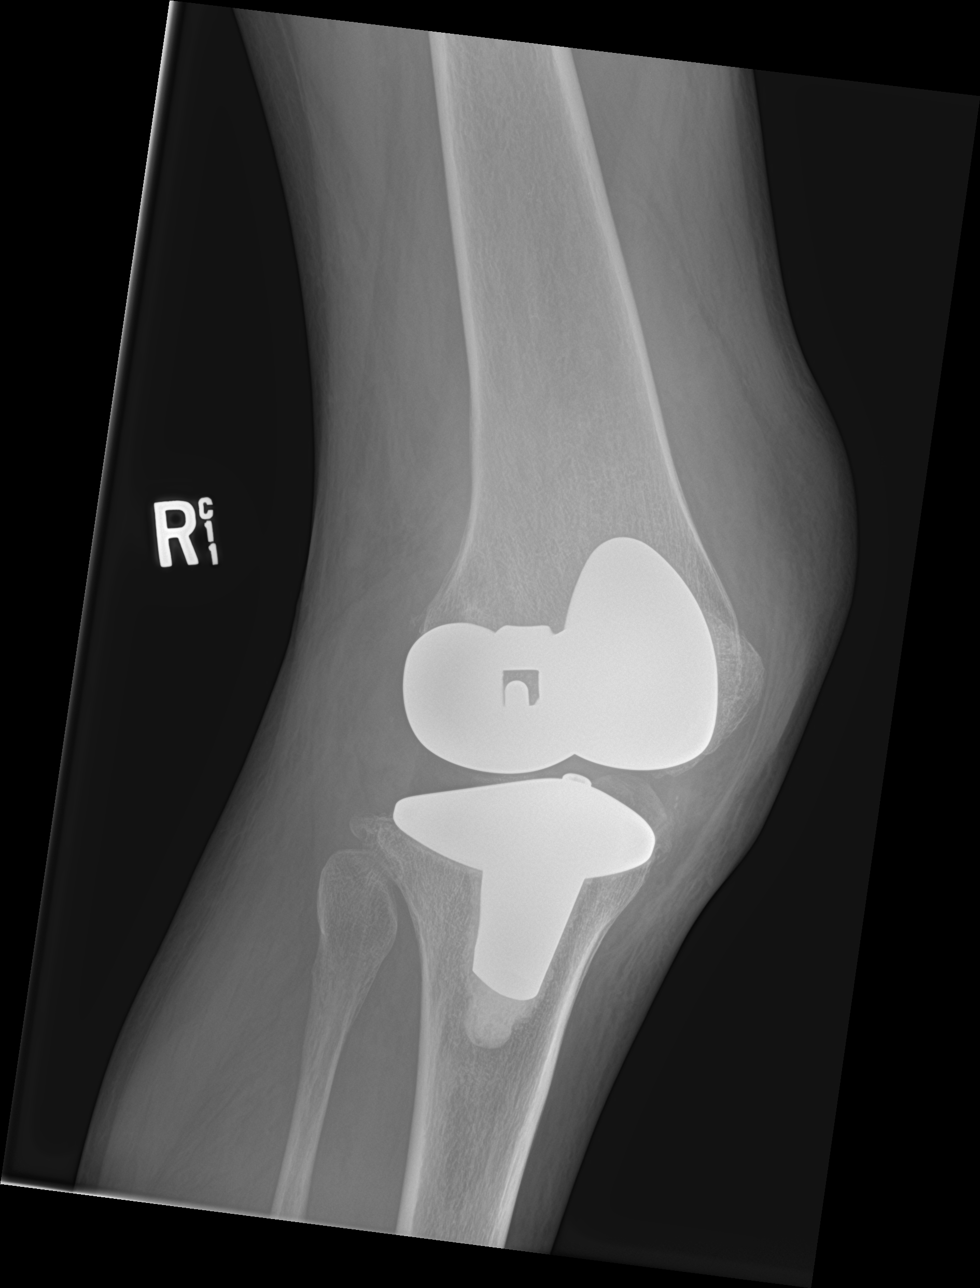

[knee obl (2 of 2)]
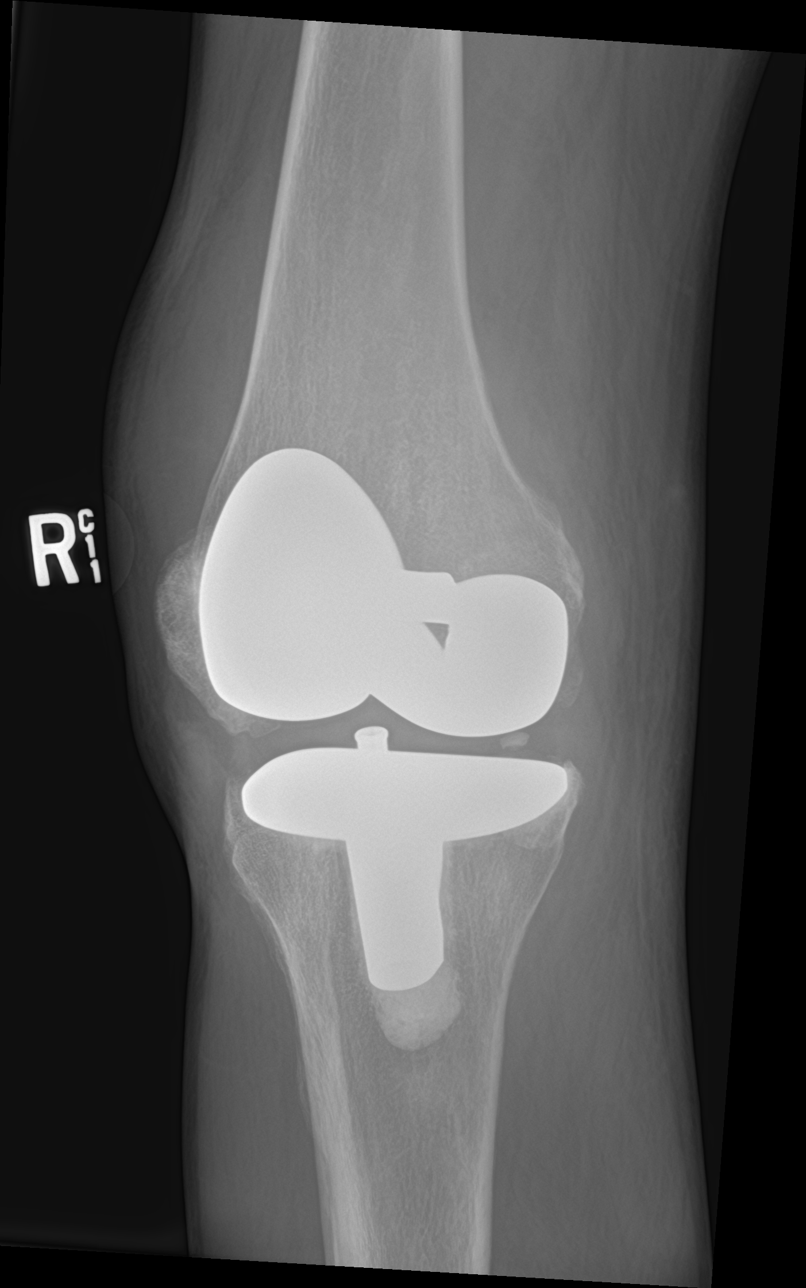

[knee lat]
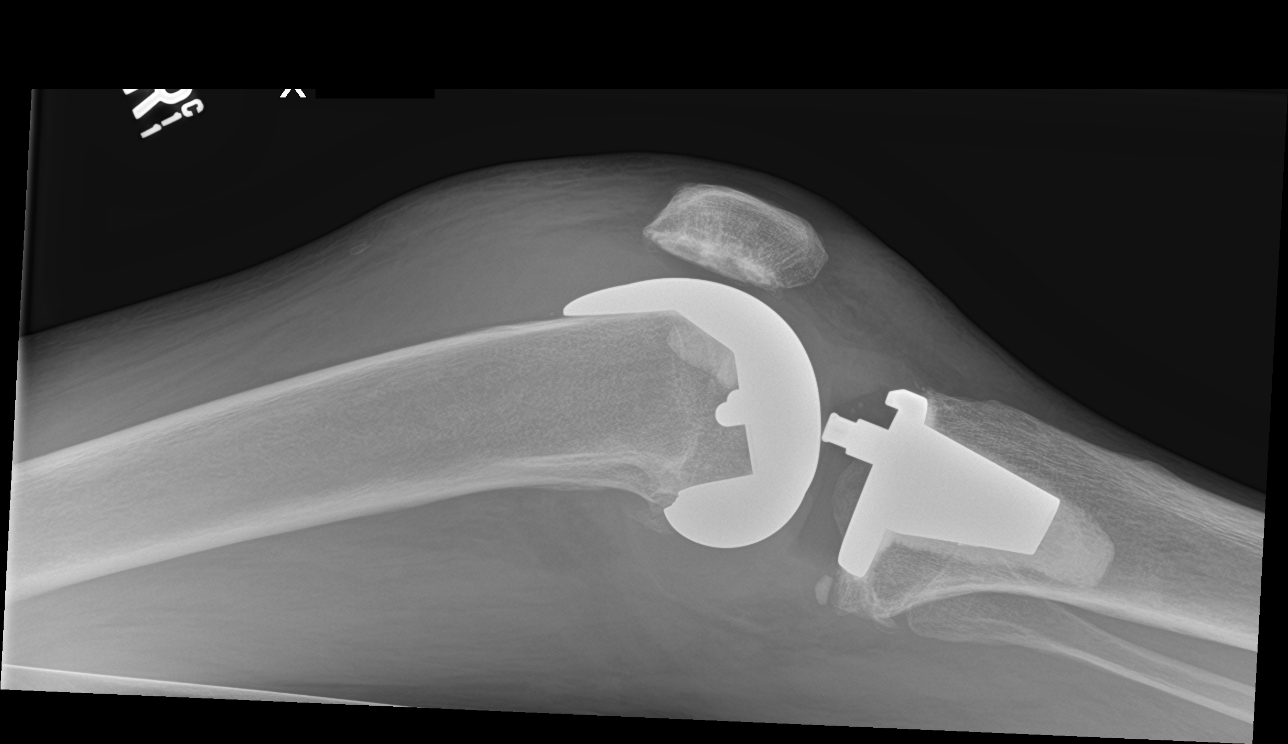

[4 of 4 positions shown; findings below may reference images not displayed]

FINDINGS: There is a total right knee arthroplasty. The arthroplasty
components appear intact and in anatomic alignment. No evidence of
loosening. The bones are mildly osteopenic. There is no acute
fracture or dislocation. There is moderate suprapatellar and joint
effusion. Correlation with clinical exam is recommended to evaluate
for possible infection. The there is diffuse subcutaneous edema. No
radiopaque foreign object or soft tissue gas.
IMPRESSION: 1. No acute fracture or dislocation.
2. Total right knee arthroplasty appears intact and in anatomic
alignment.
3. Moderate suprapatellar and joint effusion concerning for an
infectious process. Clinical correlation is recommended.

## 2021-04-21 NOTE — Progress Notes (Signed)
H&P ? ?Chief Complaint: Urinary retention ? ?History of Present Illness: 56 year old male presents today for further management of urinary retention. ? 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  Since then he has been treated with chemo/radiotherapy.  Chemo has temporarily been suspended due to significant weight loss and recent hospitalization for severe anemia.  During hospitalization, he went into urinary retention and Foley catheter was placed.  This has been in place since that time.  He has been out of the hospital for 2 weeks.  He was appropriately placed on tamsulosin.  He has no longstanding lower urinary tract issues. ? ?Past Medical History:  ?Diagnosis Date  ? Anemia   ? as a child  ? Anxiety   ? Arthritis   ? "everywhere" (04/21/2012)  ? Cellulitis and abscess of foot 12/19/2013  ? LEFT FOOT  ? Cellulitis of right foot   ? Chronic pain   ? Constipation   ? DDD (degenerative disc disease)   ? Depression   ? pt denies  ? Diabetes mellitus without complication ([place])   ? borderline  ? Diabetic foot ulcer ([place])   ? ETOH abuse   ? Full dentures   ? GERD (gastroesophageal reflux disease)   ? tums  ? H/O amputation of lesser toe, right ([place]) 07/29/2017  ? History of blood transfusion   ? "related to left knee OR; probably right hip too" (04/21/2012)  ? History of kidney stones   ? Mental disorder   ? MRSA (methicillin resistant staph aureus) culture positive   ? Neuromuscular disorder ([place])   ? neuropathy  ? Noncompliance   ? Open wound   ? bottom of foot  ? Osteomyelitis ([place])   ? right 4th toe  ? Osteomyelitis of right foot ([place])   ? Pneumonia ~ 2012  ? Polysubstance abuse ([place])   ? etoh, cocaine, heroin  ? Stroke [facility]) 2008  ? "they said I might have had one during right hip replacement" (04/21/2012)  ? Wears glasses   ? ? ?Past Surgical History:  ?Procedure Laterality Date  ? AMPUTATION Right 06/09/2017  ? Procedure: RIGHT 2ND TOE AMPUTATION;  Surgeon: [name], MD;  Location: [place];  Service: Orthopedics;  Laterality: Right;  ? AMPUTATION Right 07/23/2017  ? Procedure: RIGHT 4TH TOE AMPUTATION;  Surgeon: [name], MD;  Location: [place];  Service: Orthopedics;  Laterality: Right;  ? AMPUTATION Right 06/15/2018  ? Procedure: RIGHT FOOT TRANSMETATARSAL AMPUTATION;  Surgeon: [name], MD;  Location: [place];  Service: Orthopedics;  Laterality: Right;  right  ? EXCISIONAL TOTAL KNEE ARTHROPLASTY WITH ANTIBIOTIC SPACERS Right 11/23/2018  ? Procedure: REMOVAL OF RIGHT TOTAL KNEE ARTHROPLASTY AND PLACEMENT OF ANATOMIC ANTIBIOTIC SPACER;  Surgeon: [name], MD;  Location: [place];  Service: Orthopedics;  Laterality: Right;  ? I & D EXTREMITY Right 11/18/2018  ? Procedure: RIGHT KNEE DEBRIDEMENT;  Surgeon: [name], MD;  Location: [place];  Service: Orthopedics;  Laterality: Right;  ? JOINT REPLACEMENT    ? KNEE ARTHROSCOPY Bilateral 1980's/1990's  ? LUNG LOBECTOMY Left ~ 2006  ? LUNG LOBECTOMY    ? METATARSAL OSTEOTOMY  10/29/2011  ? Procedure: METATARSAL OSTEOTOMY;  Surgeon: [name], MD;  Location: [place];  Service: Orthopedics;  Laterality: Left;  Left 1st Metatarsal Dorsal Closing Wedge   ? METATARSAL OSTEOTOMY Right 04/28/2017  ? Procedure: RIGHT 1ST METATARSAL DORSAL CLOSING WEDGE OSTEOTOMY;  Surgeon: [name], MD;  Location: [place];  Service: Orthopedics;  Laterality: Right;  ? MULTIPLE TOOTH EXTRACTIONS    ? REVISION TOTAL HIP ARTHROPLASTY Right 2008  ? "4-5 months after replacement" (04/21/2012)  ? TOTAL HIP ARTHROPLASTY Right 2008  ? TOTAL KNEE ARTHROPLASTY Left 2006  ? TOTAL KNEE ARTHROPLASTY Right 04/20/2012  ? TOTAL KNEE ARTHROPLASTY Right 04/20/2012  ? Procedure: TOTAL KNEE ARTHROPLASTY;  Surgeon: [name], MD;  Location: [place];  Service: Orthopedics;  Laterality: Right;  Right Total Knee Arthroplasty  ? TOTAL KNEE REVISION Right 03/22/2019  ? Procedure: RIGHT TOTAL KNEE ARTHROPLASTY REVISION OF ALL COMPONENTS;  Surgeon: [name], MD;   Location: [place];  Service: Orthopedics;  Laterality: Right;  ? ? ?Home Medications:  ?Allergies as of 04/22/2021   ? ?   Reactions  ? Benadryl [diphenhydramine Hcl] Other (See Comments)  ? Leg spasms   ? Trazodone And Nefazodone Other (See Comments)  ? Leg spasms   ? ?  ? ?  ?Medication List  ?  ? ?  ? Accurate as of April 21, 2021  8:13 PM. If you have any questions, ask your nurse or doctor.  ?  ?  ? ?  ? ?acetaminophen 325 MG tablet ?Commonly known as: TYLENOL ?Take 1-2 tablets (325-650 mg total) by mouth every 6 (six) hours as needed for mild pain (pain score 1-3 or temp > 100.5). ?  ?aspirin 325 MG EC tablet ?Take 1 tablet (325 mg total) by mouth daily with breakfast. ?  ?carvedilol 25 MG tablet ?Commonly known as: COREG ?Take 1 tablet (25 mg total) by mouth 2 (two) times daily with a meal. ?  ?feeding supplement (GLUCERNA SHAKE) Liqd ?Take 237 mLs by mouth 3 (three) times daily between meals. ?  ?gabapentin 300 MG capsule ?Commonly known as: NEURONTIN ?Take 1 capsule (300 mg total) by mouth 3 (three) times daily. ?  ?ibuprofen 200 MG tablet ?Commonly known as: ADVIL ?Take 200 mg by mouth every 6 (six) hours as needed. ?  ?metFORMIN 500 MG tablet ?Commonly known as: Glucophage ?Take 2 tablets (1,000 mg total) by mouth 2 (two) times daily with a meal. ?What changed: how much to take ?  ?nitroGLYCERIN 0.2 mg/hr patch ?Commonly known as: NITRODUR - Dosed in mg/24 hr ?Place 1 patch (0.2 mg total) onto the skin daily. ?  ?Oxycodone HCl 10 MG Tabs ?Take 1-1.5 tablets (10-15 mg total) by mouth every 4 (four) hours as needed for severe pain (pain score 7-10). ?  ?polyethylene glycol 17 g packet ?Commonly known as: MIRALAX / GLYCOLAX ?Take 17 g by mouth daily as needed for mild constipation. ?  ?rosuvastatin 20 MG tablet ?Commonly known as: CRESTOR ?Take 1 tablet (20 mg total) by mouth daily. ?  ?silver sulfADIAZINE 1 % cream ?Commonly known as: SILVADENE ?Apply 1 application topically daily. ?   ?sulfamethoxazole-trimethoprim 800-160 MG tablet ?Commonly known as: Bactrim DS ?Take 1 tablet by mouth 2 (two) times daily. ?  ? ?  ? ? ?Allergies:  ?Allergies  ?Allergen Reactions  ? Benadryl [Diphenhydramine Hcl] Other (See Comments)  ?  Leg spasms   ? Trazodone And Nefazodone Other (See Comments)  ?  Leg spasms   ? ? ?Family History  ?Problem Relation Age of Onset  ? Diabetes Father   ? ? ?Social History:  reports that he has been smoking cigarettes. He has a 15.00 pack-year smoking history. He has never used smokeless tobacco. He reports that he  does not currently use alcohol. He reports that he does not currently use drugs after having used the following drugs: Cocaine. ? ?ROS: ?A complete review of systems was performed.  All systems are negative except for pertinent findings as noted. ? ?Physical Exam:  ?Vital signs in last 24 hours: ?There were no vitals taken for this visit. ?Constitutional:  Alert and oriented, No acute distress.  He is cachectic.  He is using a walker. ?Cardiovascular: Regular rate  ?Respiratory: Normal respiratory effort ?GI: Abdomen is soft, nontender, nondistended, no abdominal masses. No CVAT.  There are no inguinal hernias. ?Genitourinary: Normal male phallus, testes are descended bilaterally and non-tender and without masses, scrotum is normal in appearance without lesions or masses, perineum is normal on inspection.  Prostate almost nonpalpable, no nodularity.  Normal anal sphincter tone. ?Lymphatic: No lymphadenopathy ?Neurologic: Grossly intact, no focal deficits ?Psychiatric: Normal mood and affect ? ?I have reviewed prior pt notes ? ?I have reviewed notes from referring/previous physicians ? ?I have reviewed urinalysis results ? ?I have independently reviewed prior imaging ? ? ? ?Impression/Assessment:  ?Urinary retention-resolved.  This is most likely related to the patient's physical condition when he went into the hospital.  He passed a trial of voiding today. ? ? ?Plan:   ?1.  Continue tamsulosin for 1 more week, and then wean off ? ?2.  I sent in 5 days of Macrobid to cover his catheter placement ? ?3.  I will have him come back on as-needed basis ? ?

## 2021-04-22 ENCOUNTER — Ambulatory Visit (INDEPENDENT_AMBULATORY_CARE_PROVIDER_SITE_OTHER): Payer: Medicare Other | Admitting: Urology

## 2021-04-22 VITALS — BP 116/69 | HR 80 | Ht 75.0 in | Wt 160.0 lb

## 2021-04-22 DIAGNOSIS — R339 Retention of urine, unspecified: Secondary | ICD-10-CM | POA: Diagnosis not present

## 2021-04-22 DIAGNOSIS — N138 Other obstructive and reflux uropathy: Secondary | ICD-10-CM | POA: Diagnosis not present

## 2021-04-22 DIAGNOSIS — N401 Enlarged prostate with lower urinary tract symptoms: Secondary | ICD-10-CM | POA: Diagnosis not present

## 2021-04-22 MED ORDER — NITROFURANTOIN MONOHYD MACRO 100 MG PO CAPS
100.0000 mg | ORAL_CAPSULE | Freq: Two times a day (BID) | ORAL | 0 refills | Status: AC
Start: 2021-04-22 — End: 2021-04-27

## 2021-04-22 NOTE — Progress Notes (Signed)
Fill and Pull Catheter Removal ? ?Patient is present today for a catheter removal.  Patient was cleaned and prepped in a sterile fashion 385m of sterile water/ saline was instilled into the bladder when the patient felt the urge to urinate. 190mof water was then drained from the balloon.  A 16FR foley cath was removed from the bladder no complications were noted .  Patient as then given some time to void on their own.  Patient can void  35053mn their own after some time.  Patient tolerated well. ? ?Performed by: Leronda Lewers LPN ? ?Follow up/ Additional notes: Per MD note ?

## 2021-04-23 ENCOUNTER — Encounter (INDEPENDENT_AMBULATORY_CARE_PROVIDER_SITE_OTHER): Payer: Self-pay | Admitting: *Deleted

## 2021-04-23 ENCOUNTER — Encounter: Payer: Self-pay | Admitting: *Deleted

## 2021-06-10 ENCOUNTER — Encounter (INDEPENDENT_AMBULATORY_CARE_PROVIDER_SITE_OTHER): Payer: Self-pay | Admitting: Gastroenterology

## 2021-06-10 ENCOUNTER — Ambulatory Visit (INDEPENDENT_AMBULATORY_CARE_PROVIDER_SITE_OTHER): Payer: Medicare Other | Admitting: Gastroenterology

## 2021-06-10 VITALS — BP 129/82 | HR 78 | Temp 97.7°F | Ht 75.0 in | Wt 152.5 lb

## 2021-06-10 DIAGNOSIS — K269 Duodenal ulcer, unspecified as acute or chronic, without hemorrhage or perforation: Secondary | ICD-10-CM | POA: Insufficient documentation

## 2021-06-10 DIAGNOSIS — D500 Iron deficiency anemia secondary to blood loss (chronic): Secondary | ICD-10-CM

## 2021-06-10 MED ORDER — PANTOPRAZOLE SODIUM 40 MG PO TBEC: 40.0000 mg | DELAYED_RELEASE_TABLET | Freq: Two times a day (BID) | ORAL | 1 refills | Status: DC

## 2021-06-10 NOTE — Progress Notes (Signed)
Referring Provider: Wilburt Finlay, MD Primary Care Physician:  Wilburt Finlay, MD Primary GI Physician:   Chief Complaint  Patient presents with   duodenal ulcer    Patient here today due to a duodenal ulcer. He says he also has a feeding tube also. Due to a tumor on the back of his tongue. He is done with chemo and radiation treatments.    HPI:   [name] is a 56 y.o. male with past medical history of anemia, anxiety, arthritis, chronic pain, DDD, depression, ETOH abuse, GERD, polysubstance abuse, stroke.   Patient presenting today as a new patient for duodenal ulcer.  Patient states that he was diagnosed with a tumor on the base of his tongue in oct 2022, he completed chemo and radiation, completed these recently. Has feeding tube in place currently. States is seeing oncologist at [facility] in [place] on Thursday to follow up on if he is in remission or not. He states that in March he was in for his radiation treatment and began vomiting coffee ground emesis, he was sent to the ER for further evaluation. Was then transferred to [facility] where he had EGD with findings as below. He denies any rectal bleeding or melena prior to that episode. He denies any abdominal pain prior to findings of the ulcer. Denies any current rectal bleeding or melena. No nausea or vomiting. He is currently only doing tube feedings for the most part. Is eating some light foods such as bone broth. He does note some nausea with the tube feedings at times. No GERD symptoms. He is not taking his PPI as he ran out. Denies any previous colonoscopy. States that he does have fatigue but feels this is related to his recent treatment. Currently maintained on iron pills daily. Having labs done again on Thursday.   CT A/P with contrast on 3/21 with 1cm low density lesion within liver, PCP has addressed and recommended follow up with oncologist regarding this.  Hgb lowest 5.9 on 04/06/21, Iron 17.3, iron sat 12%, TIBC  150, ferritin 836 EGD done 04/06/21 with findings as below, last hgb 9 in April  Last FOBT done 05/06/21 positive  NSAID use: none currently  Social LE:[id number] drank atleast one 6 pack of beer per day, occasional liquor, previous smoker 1PPD x30 years Fam hx: no crc or liver disease.   Last Colonoscopy:never Last Endoscopy:04/06/21 Esophagus:  LA class B reflux esophagitis present in the distal esophagus.   I did not appreciate any significant radiation changes in the proximal  esophagus.  Stomach: The stomach was normal throughout.  Intact PEG bumper in the  distal stomach.  Views under the PEG bumper reveal no associated  ulceration.  Biopsies were obtained from the antrum and body for H. pylori  assessment.  Duodenum:In the distal duodenal bulb there was a large 1.5 cm deep   cratered ulcer.  There was no old or fresh blood present.  There was a  flat pigmented spot at the ulcer base ([place] 2C).   Recommendations:    Past Medical History:  Diagnosis Date   Anemia    as a child   Anxiety    Arthritis    "everywhere" (04/21/2012)   Cellulitis and abscess of foot 12/19/2013   LEFT FOOT   Cellulitis of right foot    Chronic pain    Constipation    DDD (degenerative disc disease)    Depression    pt denies   Diabetes mellitus  without complication ([place])    borderline   Diabetic foot ulcer ([place])    ETOH abuse    Full dentures    GERD (gastroesophageal reflux disease)    tums   H/O amputation of lesser toe, right ([place]) 07/29/2017   History of blood transfusion    "related to left knee OR; probably right hip too" (04/21/2012)   History of kidney stones    Mental disorder    MRSA (methicillin resistant staph aureus) culture positive    Neuromuscular disorder ([place])    neuropathy   Noncompliance    Open wound    bottom of foot   Osteomyelitis ([place])    right 4th toe   Osteomyelitis of right foot ([place])    Pneumonia ~ 2012   Polysubstance abuse ([place])    etoh, cocaine,  heroin   Stroke [facility]) 2008   "they said I might have had one during right hip replacement" (04/21/2012)   Wears glasses     Past Surgical History:  Procedure Laterality Date   AMPUTATION Right 06/09/2017   Procedure: RIGHT 2ND TOE AMPUTATION;  Surgeon: [name], MD;  Location: [place];  Service: Orthopedics;  Laterality: Right;   AMPUTATION Right 07/23/2017   Procedure: RIGHT 4TH TOE AMPUTATION;  Surgeon: [name], MD;  Location: [place];  Service: Orthopedics;  Laterality: Right;   AMPUTATION Right 06/15/2018   Procedure: RIGHT FOOT TRANSMETATARSAL AMPUTATION;  Surgeon: [name], MD;  Location: [place];  Service: Orthopedics;  Laterality: Right;  right   EXCISIONAL TOTAL KNEE ARTHROPLASTY WITH ANTIBIOTIC SPACERS Right 11/23/2018   Procedure: REMOVAL OF RIGHT TOTAL KNEE ARTHROPLASTY AND PLACEMENT OF ANATOMIC ANTIBIOTIC SPACER;  Surgeon: [name], MD;  Location: [place];  Service: Orthopedics;  Laterality: Right;   I & D EXTREMITY Right 11/18/2018   Procedure: RIGHT KNEE DEBRIDEMENT;  Surgeon: [name], MD;  Location: [place];  Service: Orthopedics;  Laterality: Right;   JOINT REPLACEMENT     KNEE ARTHROSCOPY Bilateral 1980's/1990's   LUNG LOBECTOMY Left ~ 2006   LUNG LOBECTOMY     METATARSAL OSTEOTOMY  10/29/2011   Procedure: METATARSAL OSTEOTOMY;  Surgeon: [name], MD;  Location: [place];  Service: Orthopedics;  Laterality: Left;  Left 1st Metatarsal Dorsal Closing Wedge    METATARSAL OSTEOTOMY Right 04/28/2017   Procedure: RIGHT 1ST METATARSAL DORSAL CLOSING WEDGE OSTEOTOMY;  Surgeon: [name], MD;  Location: [place];  Service: Orthopedics;  Laterality: Right;   MULTIPLE TOOTH EXTRACTIONS     REVISION TOTAL HIP ARTHROPLASTY Right 2008   "4-5 months after replacement" (04/21/2012)   TOTAL HIP ARTHROPLASTY Right 2008   TOTAL KNEE ARTHROPLASTY Left 2006   TOTAL KNEE ARTHROPLASTY Right 04/20/2012   TOTAL KNEE ARTHROPLASTY Right 04/20/2012   Procedure: TOTAL KNEE  ARTHROPLASTY;  Surgeon: [name], MD;  Location: [place];  Service: Orthopedics;  Laterality: Right;  Right Total Knee Arthroplasty   TOTAL KNEE REVISION Right 03/22/2019   Procedure: RIGHT TOTAL KNEE ARTHROPLASTY REVISION OF ALL COMPONENTS;  Surgeon: [name], MD;  Location: [place];  Service: Orthopedics;  Laterality: Right;    Current Outpatient Medications  Medication Sig Dispense Refill   acetaminophen (TYLENOL) 325 MG tablet Take 1-2 tablets (325-650 mg total) by mouth every 6 (six) hours as needed for mild pain (pain score 1-3 or temp > 100.5).     feeding supplement, GLUCERNA SHAKE, (GLUCERNA SHAKE) LIQD Take 237 mLs by mouth 3 (three) times  daily between meals.  0   fluconazole (DIFLUCAN) 200 MG tablet Take 200 mg by mouth daily.     gabapentin (NEURONTIN) 300 MG capsule Take 1 capsule (300 mg total) by mouth 3 (three) times daily. 60 capsule 1   hydrOXYzine (VISTARIL) 25 MG capsule Take 25 mg by mouth 3 (three) times daily.     ibuprofen (ADVIL) 200 MG tablet Take 200 mg by mouth every 6 (six) hours as needed.     lidocaine (XYLOCAINE) 2 % solution Use as directed in the mouth or throat daily at 6 (six) AM.     magnesium oxide (MAG-OX) 400 MG tablet Take 1 tablet by mouth 2 (two) times daily.     metFORMIN (GLUCOPHAGE) 500 MG tablet Take 2 tablets (1,000 mg total) by mouth 2 (two) times daily with a meal. (Patient taking differently: Take 500 mg by mouth 2 (two) times daily with a meal.) 60 tablet 0   metoprolol tartrate (LOPRESSOR) 25 MG tablet Take 12.5 mg by mouth 2 (two) times daily.     mirtazapine (REMERON) 30 MG tablet Take 30 mg by mouth at bedtime.     nystatin (MYCOSTATIN) 100000 UNIT/ML suspension Take 5 mLs by mouth 4 (four) times daily.     potassium chloride SA (KLOR-CON M) 20 MEQ tablet Take 20 mEq by mouth daily.     prochlorperazine (COMPAZINE) 10 MG tablet Take 10 mg by mouth daily at 6 (six) AM.     QUEtiapine (SEROQUEL) 300 MG tablet Take 300 mg by mouth at  bedtime.     sennosides-docusate sodium (SENOKOT-S) 8.6-50 MG tablet Take 1 tablet by mouth at bedtime.     sucralfate (CARAFATE) 1 GM/10ML suspension Take by mouth 4 (four) times daily.     aspirin EC 325 MG EC tablet Take 1 tablet (325 mg total) by mouth daily with breakfast. (Patient not taking: Reported on 04/22/2021) 30 tablet 0   oxyCODONE 10 MG TABS Take 1-1.5 tablets (10-15 mg total) by mouth every 4 (four) hours as needed for severe pain (pain score 7-10). (Patient not taking: Reported on 06/10/2021) 30 tablet 0   No current facility-administered medications for this visit.    Allergies as of 06/10/2021 - Review Complete 06/10/2021  Allergen Reaction Noted   Benadryl [diphenhydramine hcl] Other (See Comments) 02/14/2011   Trazodone and nefazodone Other (See Comments) 03/02/2012    Family History  Problem Relation Age of Onset   Diabetes Father     Social History   Socioeconomic History   Marital status: Divorced    Spouse name: Not on file   Number of children: Not on file   Years of education: Not on file   Highest education level: Not on file  Occupational History   Not on file  Tobacco Use   Smoking status: Former    Packs/day: 0.50    Years: 30.00    Pack years: 15.00    Types: Cigarettes    Quit date: 10/2020    Years since quitting: 0.6   Smokeless tobacco: Never   Tobacco comments:    thinking about quitting, talking with PCP  Vaping Use   Vaping Use: Former  Substance and Sexual Activity   Alcohol use: Not Currently    Alcohol/week: 0.0 standard drinks   Drug use: Not Currently    Types: Cocaine    Comment: Cocaine and heroin - 10 years ago   Sexual activity: Yes    Birth control/protection: None  Other Topics Concern  Not on file  Social History Narrative   Not on file   Social Determinants of Health   Financial Resource Strain: Not on file  Food Insecurity: Not on file  Transportation Needs: Not on file  Physical Activity: Not on file   Stress: Not on file  Social Connections: Not on file   Review of systems General: negative for malaise, night sweats, fever, chills, weight loss Neck: Negative for lumps, goiter, pain and significant neck swelling Resp: Negative for cough, wheezing, dyspnea at rest CV: Negative for chest pain, leg swelling, palpitations, orthopnea GI: denies melena, hematochezia, nausea, vomiting, diarrhea, constipation, dysphagia, odyonophagia, early satiety or unintentional weight loss.  MSK: Negative for joint pain or swelling, back pain, and muscle pain. Derm: Negative for itching or rash Psych: Denies depression, anxiety, memory loss, confusion. No homicidal or suicidal ideation.  Heme: Negative for prolonged bleeding, bruising easily, and swollen nodes. Endocrine: Negative for cold or heat intolerance, polyuria, polydipsia and goiter. Neuro: negative for tremor, gait imbalance, syncope and seizures. The remainder of the review of systems is noncontributory.  Physical Exam: BP 129/82 (BP Location: Left Arm, Patient Position: Sitting, Cuff Size: Small)   Pulse 78   Temp 97.7 F (36.5 C) (Oral)   Ht '6\' 3"'$  (1.905 m)   Wt 152 lb 8 oz (69.2 kg)   BMI 19.06 kg/m  General:   Alert and oriented. No distress noted. Pleasant and cooperative.  Head:  Normocephalic and atraumatic. Eyes:  Conjuctiva clear without scleral icterus. Mouth:  Oral mucosa pink and moist. Good dentition. No lesions. Heart: Normal rate and rhythm, s1 and s2 heart sounds present.  Lungs: Clear lung sounds in all lobes. Respirations equal and unlabored. Abdomen:  +BS, soft, non-tender and non-distended. No rebound or guarding. No HSM or masses noted. Derm: No palmar erythema or jaundice Msk:  Symmetrical without gross deformities. Normal posture. Extremities:  Without edema. Neurologic:  Alert and  oriented x4 Psych:  Alert and cooperative. Normal mood and affect.  Invalid input(s): 6 MONTHS   ASSESSMENT: [name] is a  56 y.o. male presenting today as a new patient for iron deficiency anemia and duodenal ulcer.   Duodenal ulcer discovered on EGD in March, pt started on PPI BID at that time, though ran out and is not currently on PPI therapy, continues to have anemia with last hgb 9 and positive FOBT in April. Patient has never had colonoscopy. Discussed implications and recommendations of proceeding with colonoscopy for further evaluation of anemia secondary to GI bleeding. I will refill PPI as he needs to be on this twice daily, we discussed the importance of this, would also like for him to continue carafate 1g QID. He is hesitant about pursuing colonoscopy at this time and would like to think further on proceeding. He should continue iron pills day and make me aware of  dizziness, sob worsening fatigue or syncope, is aware that heavy rectal bleeding, hematemesis or coffee ground emesis are indications of need to proceed to the ER.   PLAN:  Rx protonix '40mg'$  BID 2.  Colonoscopy once patient is amenable-ENDO 3( May need to discuss with anesthesia given presence of trach collar and previous rad therapy to neck) 3. Continue to Avoid NSAIDs, ETOH and smoking 4.  Continue iron pills daily  5. Continue carafate 1g QID 6. Pt to make me aware of dizziness, sob, worse fatigue, syncope 7. ER precautions to include hematemesis/coffee ground emesis or rectal bleeding  All questions were answered, patient  verbalized understanding and is in agreement with plan as outlined above.   Follow Up: 3 months   Sama Arauz L. Alver Sorrow, MSN, APRN, AGNP-C Adult-Gerontology Nurse Practitioner [facility]

## 2021-06-10 NOTE — Patient Instructions (Addendum)
As we discussed, I am concerned that your blood counts continue to be very low and there was presence of blood in your stools in mid April. I am recommending that we proceed with colonoscopy for further evaluation of the lower GI tract as you had an upper endoscopy in March revealing a non bleeding duodenal ulcer. **please let me know as soon as you are interested in scheduling this  Please avoid NSAIDs (advil, aleve, naproxen, goody powder, ibuprofen, mobic meloxicam) as these can be very hard on your GI tract, causing inflammation, ulcers and bleeding of the GI tract.  I have sent a refill of protonix '40mg'$  to your pharmacy, please make sure you are taking this once daily. Please continue your daily iron pills and carafate 1g 4 times per day.  If you notice worsening of fatigue, dizziness, shortness of breath, lightheadedness or episodes where you pass out, these are signs that blood counts may have become much lower, please make me aware of this. If you have any obvious rectal bleeding or any vomiting with presence of blood or coffee ground substance, these are indications you need to proceed to the ED.  Please have your oncologist send a copy of your labs from Thursday to me once they are back so that I can see how your blood counts are looking.   Follow up 3 months

## 2021-07-15 IMAGING — CT CT HEAD W/O CM
3 of 6 series · 16 of 47 positions shown, 19 images · non-contrast
Comparison: Or 17 4191

CLINICAL DATA: Altered level of consciousness.

EXAM:
CT HEAD WITHOUT CONTRAST
TECHNIQUE: Contiguous axial images were obtained from the base of the skull
through the vertex without intravenous contrast.

[Series 2: head w o · axial · 0.44mm/px · z∈[+85,+225]mm · 12 of 32 slices shown, 15 images]
[im 2/32  brain]
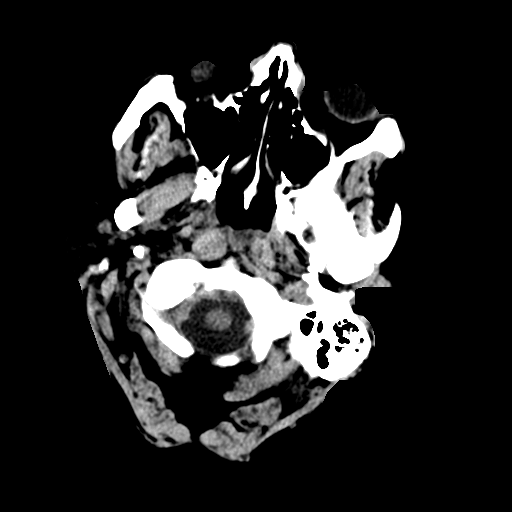
[im 2/32  bone]
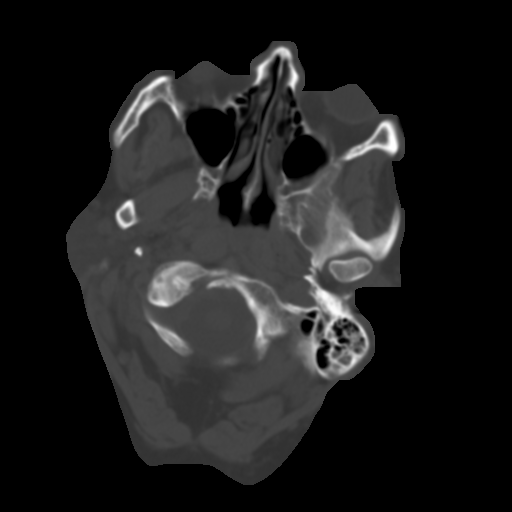
[im 5/32  brain]
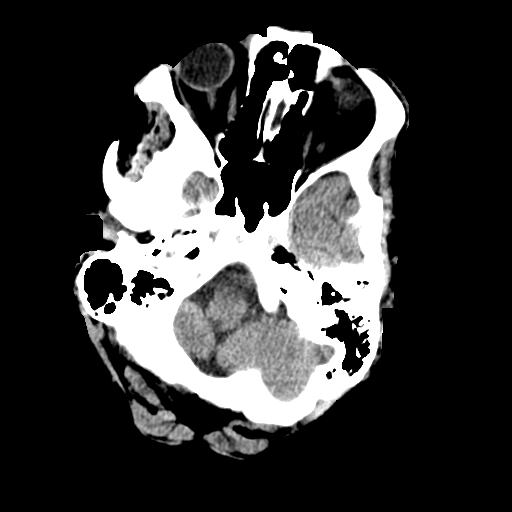
[im 8/32  brain]
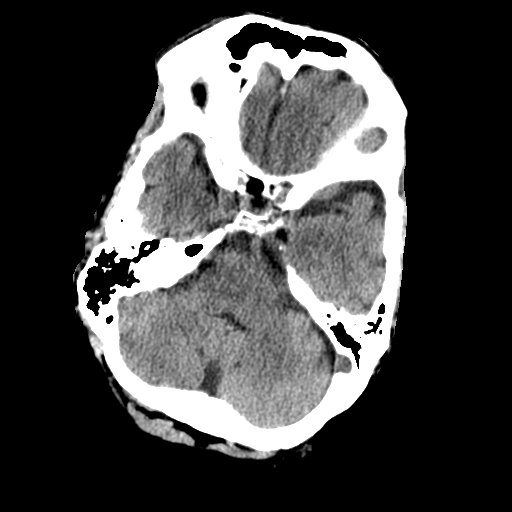
[im 9/32  brain]
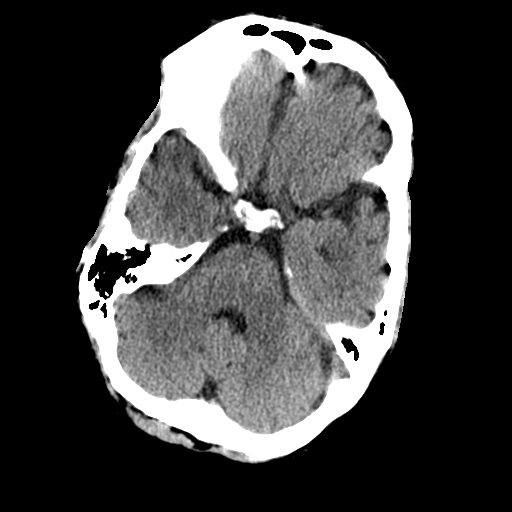
[im 12/32  brain]
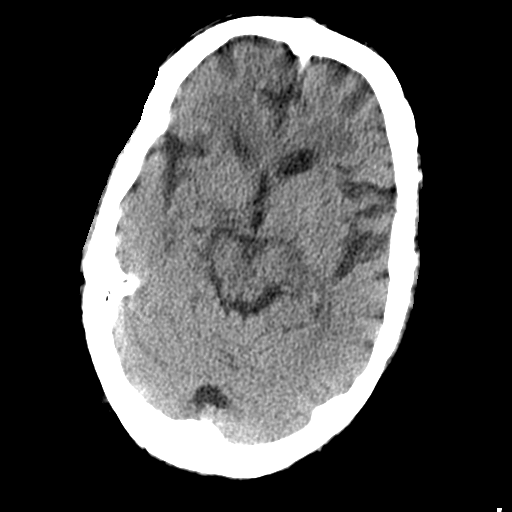
[im 12/32  bone]
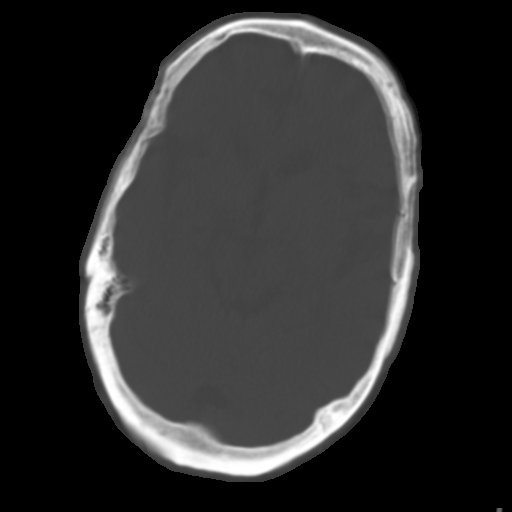
[im 15/32  brain]
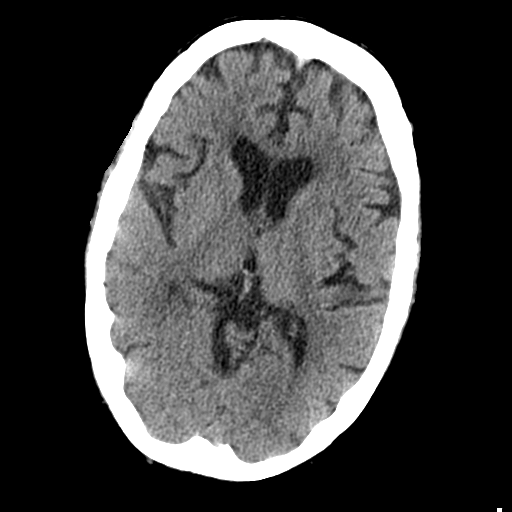
[im 17/32  brain]
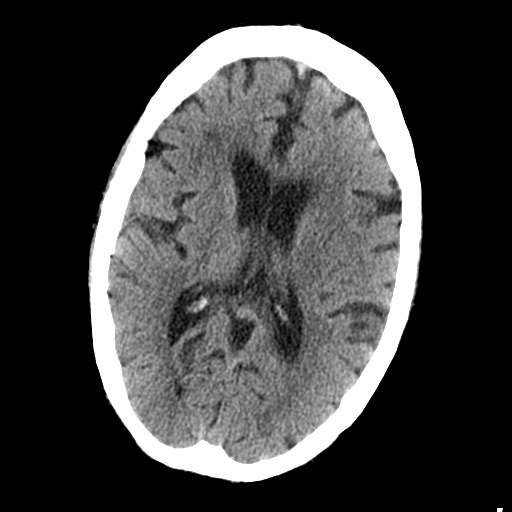
[im 20/32  brain]
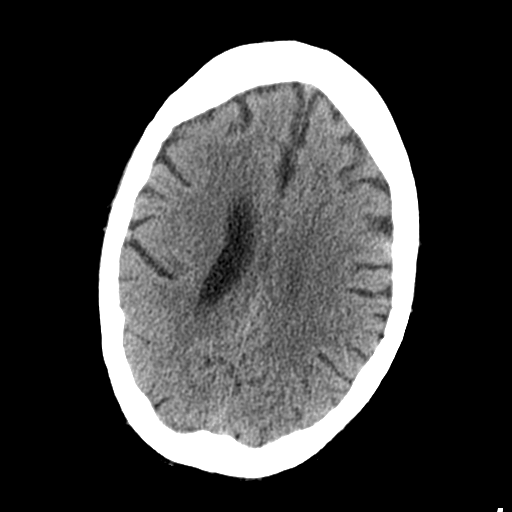
[im 23/32  brain]
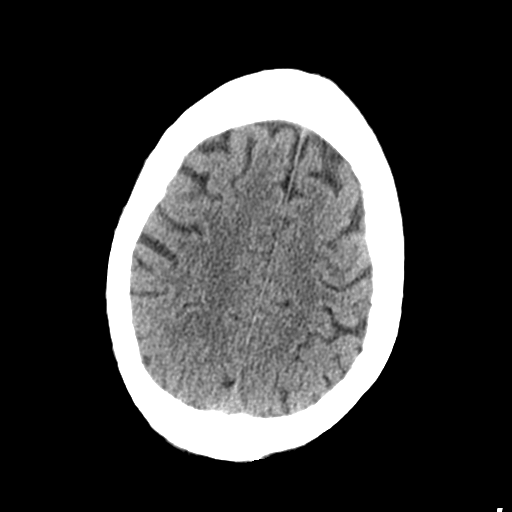
[im 23/32  bone]
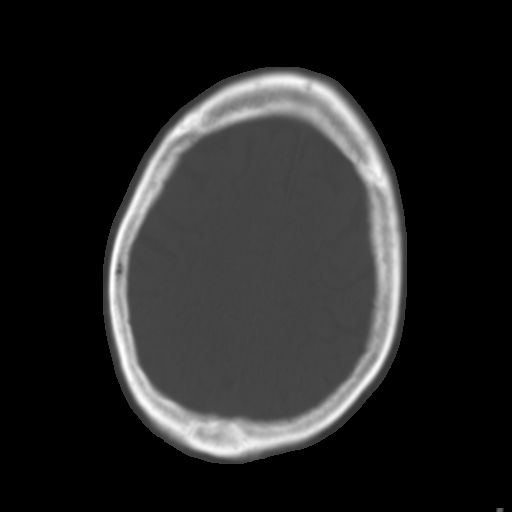
[im 24/32  brain]
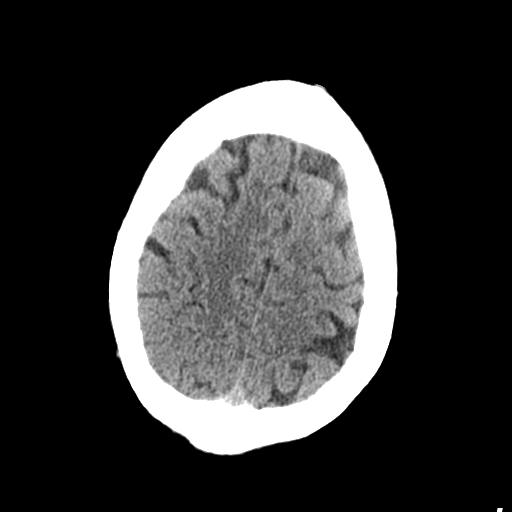
[im 27/32  brain]
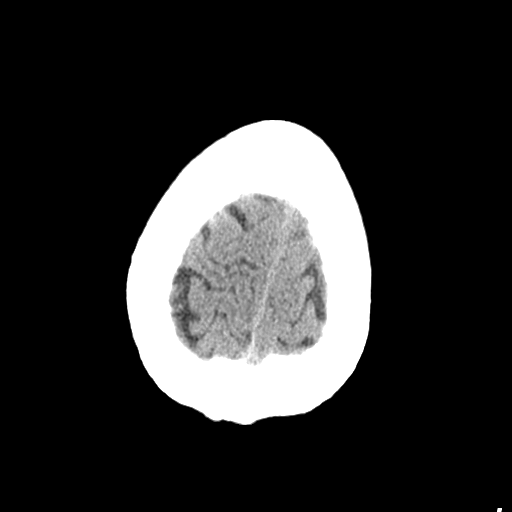
[im 30/32  brain]
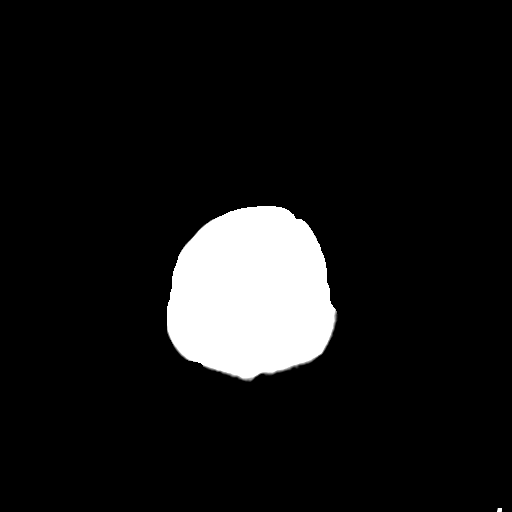

[Series 4: coronal soft · coronal · 0.34mm/px · 3 of 73 slices shown]
[im 21/73  brain]
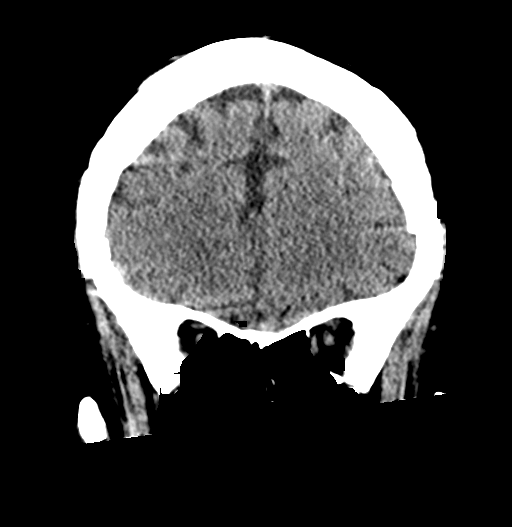
[im 31/73  brain]
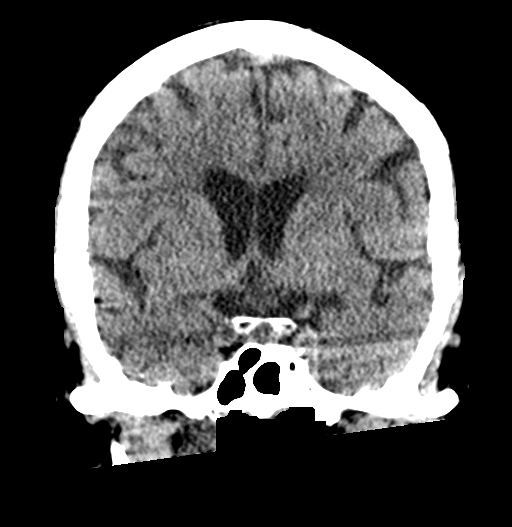
[im 41/73  brain]
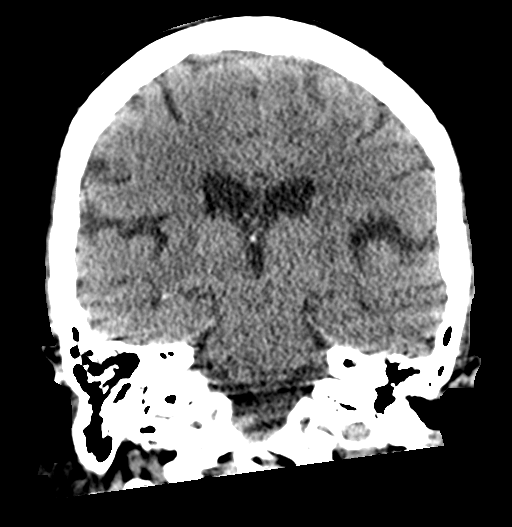

[Series 5: sagittal soft · sagittal · 0.33mm/px · 1 of 56 slices shown]
[im 28/56  brain]
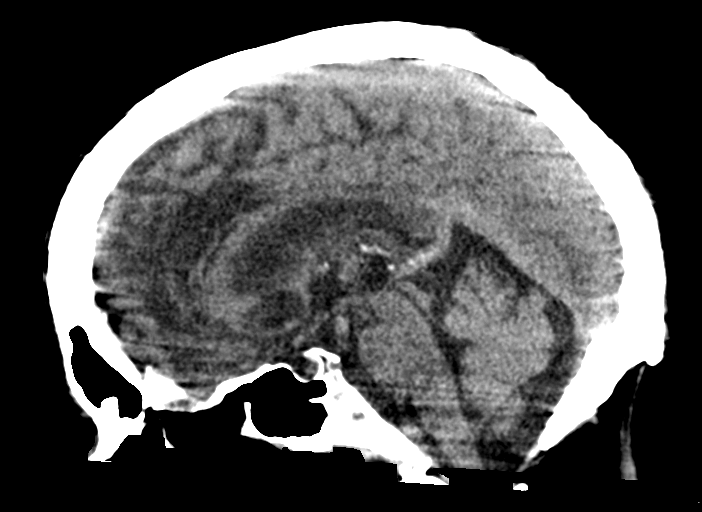

[16 of 47 positions shown; findings below may reference images not displayed]

FINDINGS: Brain: No evidence of acute infarction, hemorrhage, hydrocephalus,
extra-axial collection or mass lesion/mass effect. Age advanced
brain parenchymal volume loss and deep white matter microangiopathy.

Vascular: No hyperdense vessel or unexpected calcification.

Skull: Normal. Negative for fracture or focal lesion.

Sinuses/Orbits: No acute finding.

Other: None.
IMPRESSION: 1. No acute intracranial abnormality.
2. Age advanced brain parenchymal atrophy and chronic microvascular
disease.

## 2021-08-11 IMAGING — DX DG ABDOMEN 1V
2 series · 2 of 2 positions shown · non-contrast
Comparison: None.

CLINICAL DATA: Constipation.

EXAM:
ABDOMEN - 1 VIEW

[abdomen kub (1 of 2)]
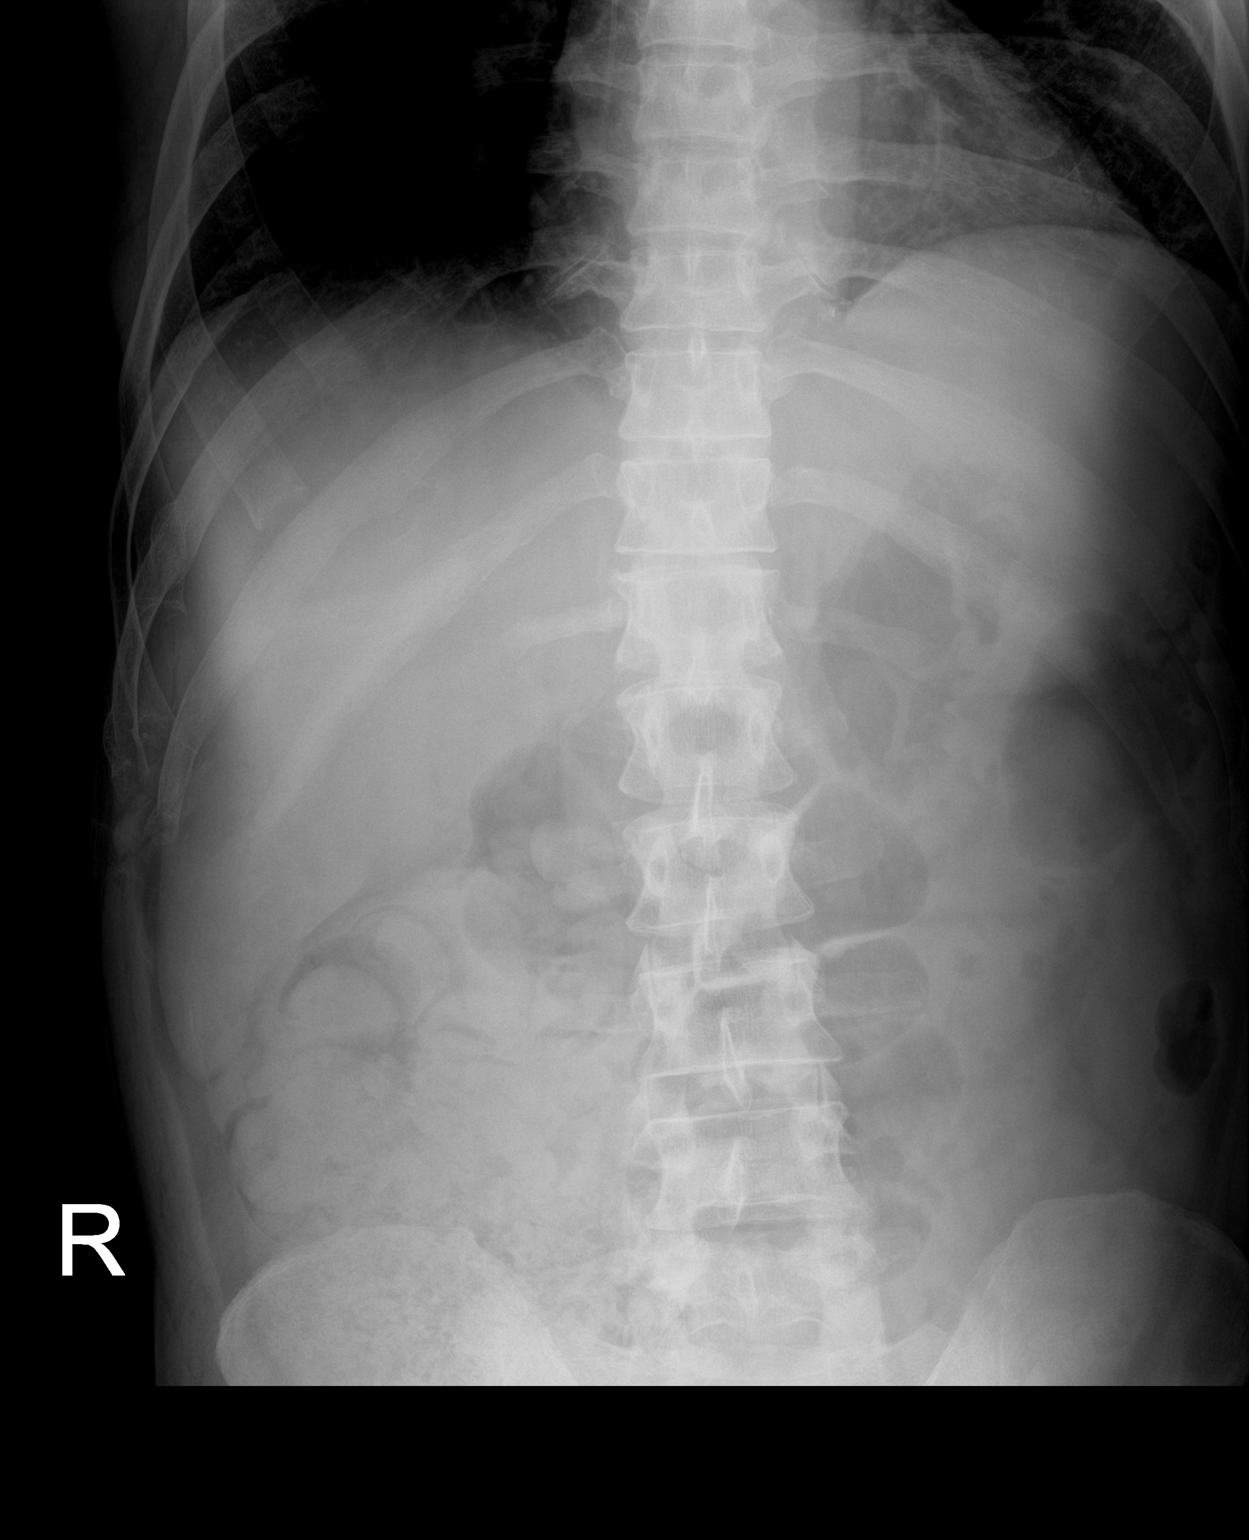

[abdomen kub (2 of 2)]
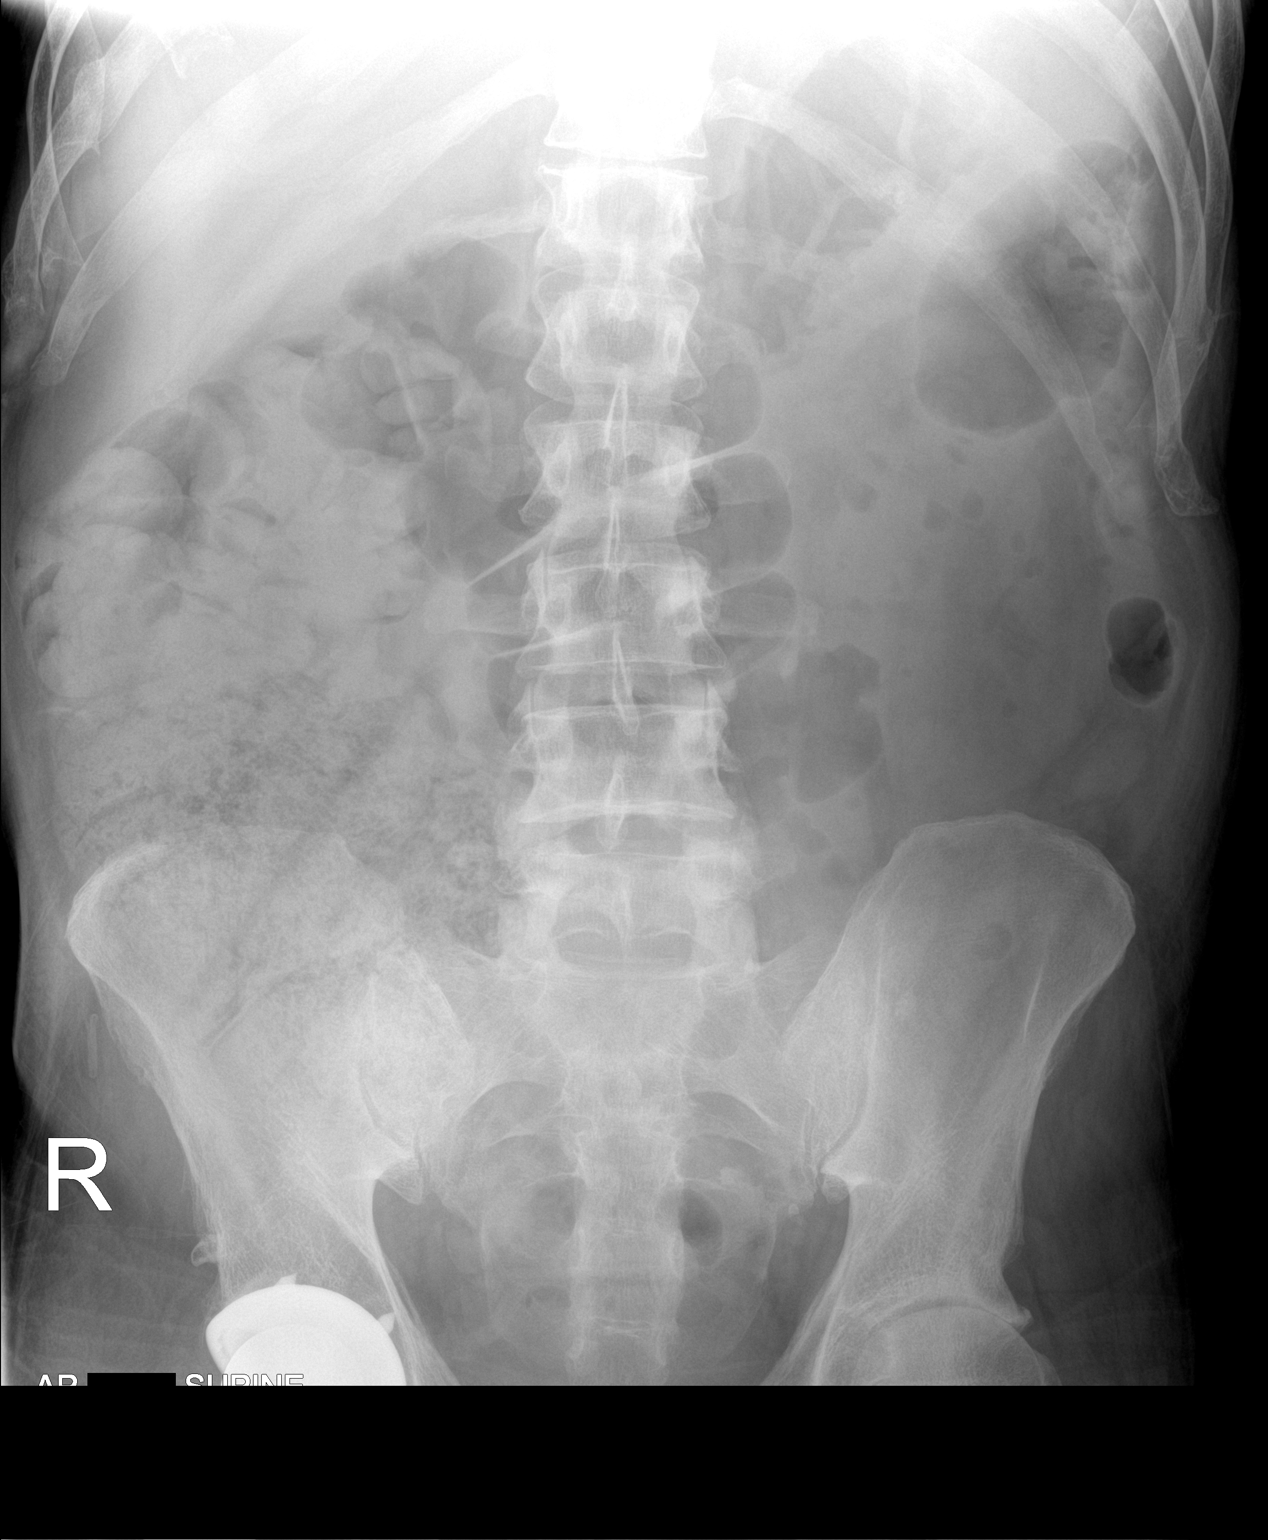

[2 of 2 positions shown; findings below may reference images not displayed]

FINDINGS: Large amount of stool throughout the colon, particularly within the
cecum and ascending colon. No gaseous distended loops of small bowel
are identified. Right hip arthroplasty. Osseous structures otherwise
unremarkable.
IMPRESSION: Large amount of stool within the cecum and ascending colon
compatible with constipation.

## 2021-09-11 ENCOUNTER — Ambulatory Visit (INDEPENDENT_AMBULATORY_CARE_PROVIDER_SITE_OTHER): Payer: Medicare Other | Admitting: Gastroenterology

## 2021-09-11 ENCOUNTER — Encounter (INDEPENDENT_AMBULATORY_CARE_PROVIDER_SITE_OTHER): Payer: Self-pay | Admitting: Gastroenterology

## 2021-09-14 IMAGING — DX DG CHEST 1V PORT
1 series · 1 of 1 positions shown · non-contrast
Comparison: 10/31/2018

CLINICAL DATA: Difficulty flushing PICC line

EXAM:
PORTABLE CHEST 1 VIEW

[chest ap]
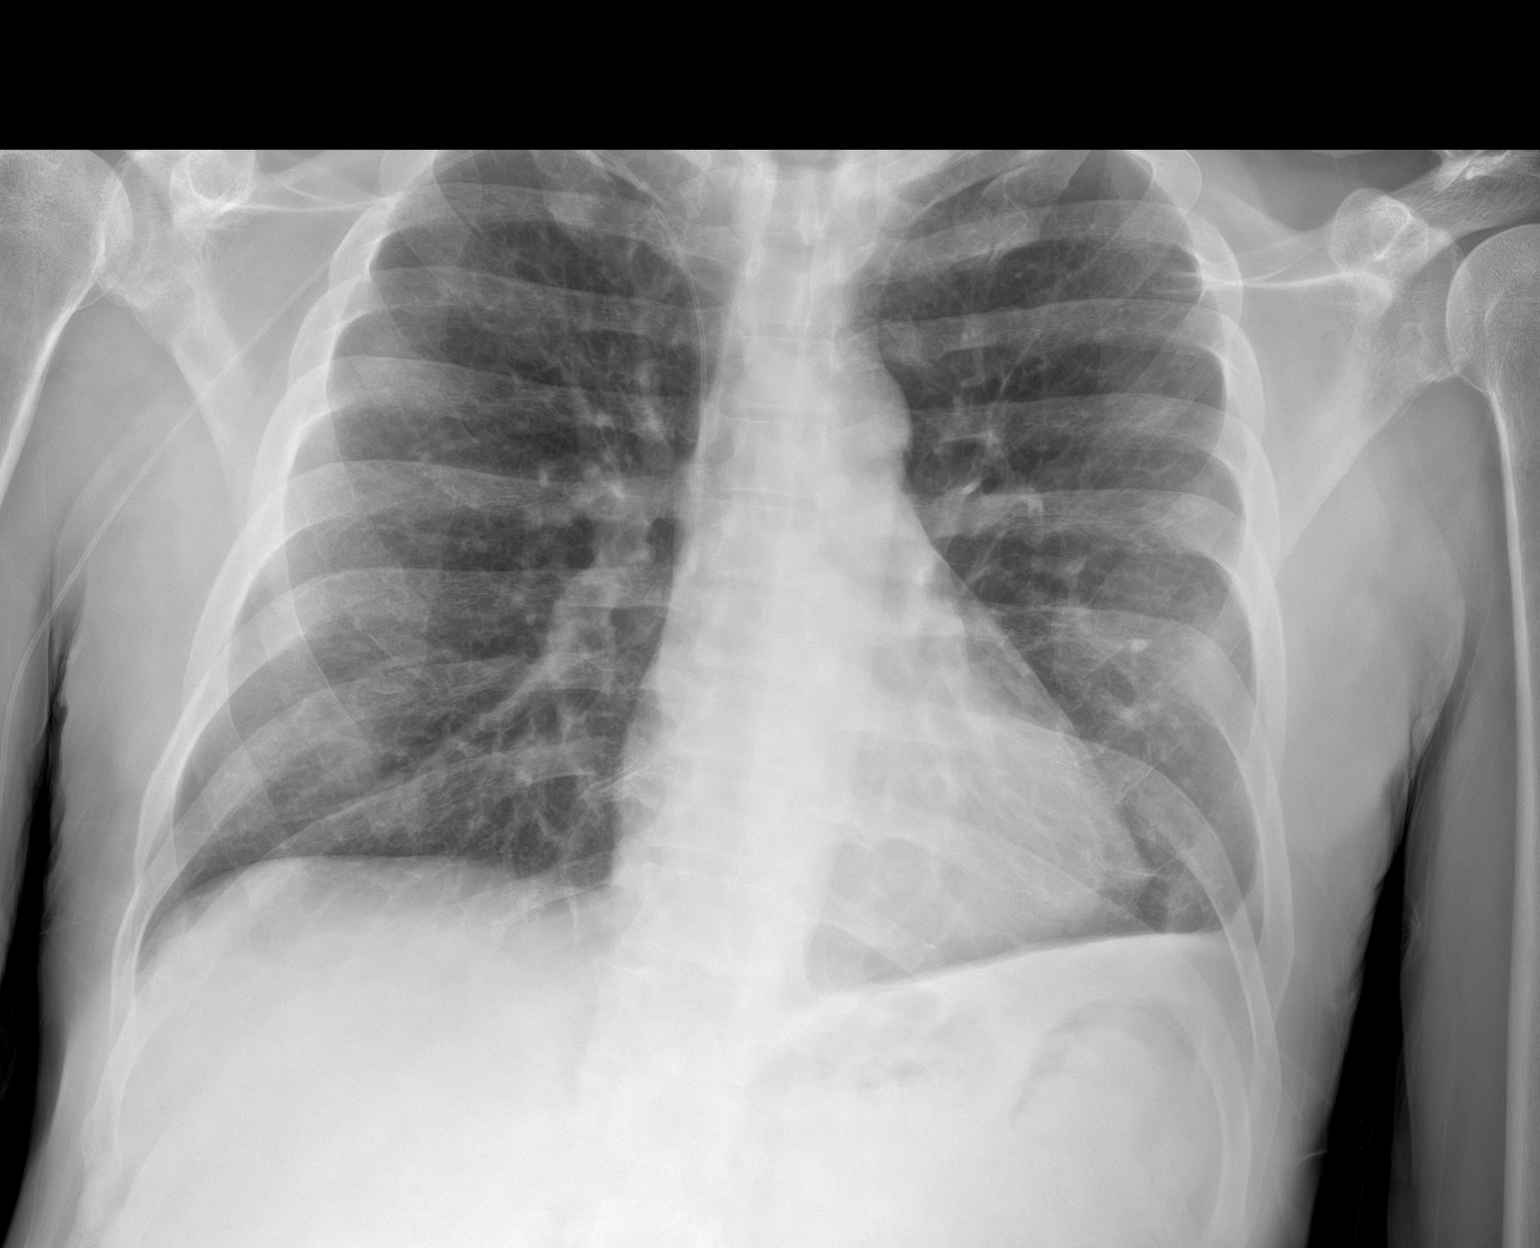

[1 of 1 positions shown; findings below may reference images not displayed]

FINDINGS: Cardiac shadow is stable. The lungs are well aerated bilaterally.
Chronic blunting of the left costophrenic angle is seen. No focal
infiltrate or effusion is noted. PICC line tip is noted just above
the cavoatrial junction in satisfactory position. By history the
PICC line cannot be flushed. No bony abnormality is noted.
IMPRESSION: PICC line in satisfactory position.

No other focal abnormality is noted. Chronic blunting of the left
costophrenic angle is seen.

## 2021-12-09 ENCOUNTER — Encounter (HOSPITAL_COMMUNITY): Payer: Self-pay | Admitting: *Deleted

## 2021-12-09 ENCOUNTER — Other Ambulatory Visit: Payer: Self-pay

## 2021-12-09 ENCOUNTER — Emergency Department (HOSPITAL_COMMUNITY)
Admission: EM | Admit: 2021-12-09 | Discharge: 2021-12-09 | Disposition: A | Discharge status: Home / Self Care | Payer: Medicare Other | Attending: Emergency Medicine | Admitting: Emergency Medicine

## 2021-12-09 DIAGNOSIS — Z79899 Other long term (current) drug therapy: Secondary | ICD-10-CM | POA: Diagnosis not present

## 2021-12-09 DIAGNOSIS — Z8501 Personal history of malignant neoplasm of esophagus: Secondary | ICD-10-CM | POA: Diagnosis not present

## 2021-12-09 DIAGNOSIS — Z7984 Long term (current) use of oral hypoglycemic drugs: Secondary | ICD-10-CM | POA: Insufficient documentation

## 2021-12-09 DIAGNOSIS — Z794 Long term (current) use of insulin: Secondary | ICD-10-CM | POA: Insufficient documentation

## 2021-12-09 DIAGNOSIS — G8918 Other acute postprocedural pain: Secondary | ICD-10-CM | POA: Diagnosis present

## 2021-12-09 HISTORY — DX: Malignant (primary) neoplasm, unspecified: C80.1

## 2021-12-09 MED ORDER — METHOCARBAMOL 500 MG PO TABS
500.0000 mg | ORAL_TABLET | Freq: Once | ORAL | Status: AC
  Administered 2021-12-09: 500 mg via ORAL
  Filled 2021-12-09: qty 1

## 2021-12-09 MED ORDER — METHOCARBAMOL 500 MG PO TABS: 500.0000 mg | ORAL_TABLET | Freq: Two times a day (BID) | ORAL | 0 refills | Status: DC

## 2021-12-09 NOTE — ED Notes (Signed)
Pt teaching provided on medications that may cause drowsiness. Pt instructed not to drive or operate heavy machinery while taking the prescribed medication. Pt verbalized understanding.   Pt provided discharge instructions and prescription information. Pt was given the opportunity to ask questions and questions were answered.   

## 2021-12-09 NOTE — Discharge Instructions (Signed)
You have been seen today for your complaint of postop pain. Your discharge medications include Robaxin.  This is a muscle relaxer.  You should take this as needed for pain in addition to Tylenol and ibuprofen. Follow up with: A primary care provider of your choice as soon as possible Please seek immediate medical care if you develop any of the following symptoms: Significant increase in your pain, fevers, chills, chest pain, shortness of breath At this time there does not appear to be the presence of an emergent medical condition, however there is always the potential for conditions to change. Please read and follow the below instructions.  Do not take your medicine if  develop an itchy rash, swelling in your mouth or lips, or difficulty breathing; call 911 and seek immediate emergency medical attention if this occurs.  You may review your lab tests and imaging results in their entirety on your MyChart account.  Please discuss all results of fully with your primary care provider and other specialist at your follow-up visit.  Note: Portions of this text may have been transcribed using voice recognition software. Every effort was made to ensure accuracy; however, inadvertent computerized transcription errors may still be present.

## 2021-12-09 NOTE — ED Triage Notes (Signed)
Pt had his throat and tongue removed x one month ago and continues to have pain  Pt states he has ran out of pain meds Sunday and needs more

## 2021-12-09 NOTE — ED Provider Notes (Signed)
[facility] EMERGENCY DEPARTMENT Provider Note   CSN: [id number] Arrival date & time: 12/09/21  1232     History  Chief Complaint  Patient presents with   Pain    [name] is a 56 y.o. male.  With a history of esophageal cancer requiring surgical removal of the tongue and a portion of the larynx, diabetes, GERD, chronic pain depression, previous stroke who presents to the ED for evaluation of postoperative throat pain.  He states he was getting his pain medications from his otolaryngologist, but cannot get them prescribed past 1 month postop.  Continues to have pain in the mouth and throat that he describes as severe.  Is taking Tylenol and ibuprofen every 4 hours with no relief.  Last took his oxycodone on Sunday.  Has seen [facility], but they stated that he did not have a diagnosis of pain and they would not treat him.  His primary care provider has retired and he has not found a new one recently.  Denies pain anywhere else including chest pain or abdominal pain, shortness of breath, fevers, chills.  Surgery was on 10/24/2021.  HPI     Home Medications Prior to Admission medications   Medication Sig Start Date End Date Taking? Authorizing Provider  acetaminophen (TYLENOL) 325 MG tablet Take 1-2 tablets (325-650 mg total) by mouth every 6 (six) hours as needed for mild pain (pain score 1-3 or temp > 100.5). 03/24/19   Persons, Bevely Palmer, PA  feeding supplement, GLUCERNA SHAKE, (GLUCERNA SHAKE) LIQD Take 237 mLs by mouth 3 (three) times daily between meals. 03/24/19   Persons, Bevely Palmer, PA  fluconazole (DIFLUCAN) 200 MG tablet Take 200 mg by mouth daily. 04/18/21   [provider]  gabapentin (NEURONTIN) 300 MG capsule Take 1 capsule (300 mg total) by mouth 3 (three) times daily. 03/24/19   Persons, Bevely Palmer, PA  hydrOXYzine (VISTARIL) 25 MG capsule Take 25 mg by mouth 3 (three) times daily. 04/17/21   [provider]  ibuprofen (ADVIL) 200 MG tablet Take 200 mg  by mouth every 6 (six) hours as needed.    [provider]  lidocaine (XYLOCAINE) 2 % solution Use as directed in the mouth or throat daily at 6 (six) AM. 03/06/21   [provider]  magnesium oxide (MAG-OX) 400 MG tablet Take 1 tablet by mouth 2 (two) times daily. 03/17/21   [provider]  metFORMIN (GLUCOPHAGE) 500 MG tablet Take 2 tablets (1,000 mg total) by mouth 2 (two) times daily with a meal. Patient taking differently: Take 500 mg by mouth 2 (two) times daily with a meal. 06/17/18   Kayleen Memos, DO  metoprolol tartrate (LOPRESSOR) 25 MG tablet Take 12.5 mg by mouth 2 (two) times daily. 04/17/21   [provider]  mirtazapine (REMERON) 30 MG tablet Take 30 mg by mouth at bedtime. 04/17/21   [provider]  nystatin (MYCOSTATIN) 100000 UNIT/ML suspension Take 5 mLs by mouth 4 (four) times daily. 04/17/21   [provider]  oxyCODONE 10 MG TABS Take 1-1.5 tablets (10-15 mg total) by mouth every 4 (four) hours as needed for severe pain (pain score 7-10). Patient not taking: Reported on 06/10/2021 03/24/19   Persons, Bevely Palmer, [place]  pantoprazole (PROTONIX) 40 MG tablet Take 1 tablet (40 mg total) by mouth 2 (two) times daily. 06/10/21   Carlan, Chelsea L, NP  potassium chloride SA (KLOR-CON M) 20 MEQ tablet Take 20 mEq by mouth daily. 03/13/21  [provider]  prochlorperazine (COMPAZINE) 10 MG tablet Take 10 mg by mouth daily at 6 (six) AM. 03/21/21   [provider]  QUEtiapine (SEROQUEL) 300 MG tablet Take 300 mg by mouth at bedtime. 02/21/21   [provider]  sennosides-docusate sodium (SENOKOT-S) 8.6-50 MG tablet Take 1 tablet by mouth at bedtime.    [provider]  sucralfate (CARAFATE) 1 GM/10ML suspension Take by mouth 4 (four) times daily. 04/18/21   [provider]      Allergies    Benadryl [diphenhydramine hcl] and Trazodone and nefazodone    Review of Systems   Review of Systems   Musculoskeletal:  Positive for neck pain.  All other systems reviewed and are negative.   Physical Exam Updated Vital Signs BP (!) 114/38   Pulse 87   Temp 98 F (36.7 C) (Oral)   Resp 18   Ht '6\' 3"'$  (1.905 m)   Wt 68 kg   SpO2 98%   BMI 18.75 kg/m  Physical Exam Vitals and nursing note reviewed.  Constitutional:      General: He is not in acute distress.    Appearance: He is well-developed.  HENT:     Head: Normocephalic and atraumatic.     Mouth/Throat:     Comments: Tracheostomy in place Eyes:     Conjunctiva/sclera: Conjunctivae normal.  Cardiovascular:     Rate and Rhythm: Normal rate and regular rhythm.     Heart sounds: No murmur heard. Pulmonary:     Effort: Pulmonary effort is normal. No respiratory distress.     Breath sounds: Normal breath sounds.  Abdominal:     Palpations: Abdomen is soft.     Tenderness: There is no abdominal tenderness.     Comments: PEG tube in place, no surrounding erythema or purulent drainage  Musculoskeletal:        General: No swelling. Normal range of motion.     Cervical back: Normal range of motion and neck supple.  Skin:    General: Skin is warm and dry.     Capillary Refill: Capillary refill takes less than 2 seconds.  Neurological:     General: No focal deficit present.     Mental Status: He is alert and oriented to person, place, and time.  Psychiatric:        Mood and Affect: Mood normal.        Behavior: Behavior normal.     ED Results / Procedures / Treatments   Labs (all labs ordered are listed, but only abnormal results are displayed) Labs Reviewed - No data to display  EKG None  Radiology No results found.  Procedures Procedures    Medications Ordered in ED Medications  methocarbamol (ROBAXIN) tablet 500 mg (has no administration in time range)    ED Course/ Medical Decision Making/ A&P                           Medical Decision Making Risk Prescription drug management.  This patient  presents to the ED for concern of post op pain, this involves an extensive number of treatment options, and is a complaint that carries with it a high risk of complications and morbidity.  The differential diagnosis includes postop pain, infection   Co morbidities that complicate the patient evaluation  esophageal cancer requiring surgical removal of the tongue and a portion of the larynx, diabetes, GERD, chronic pain depression, previous stroke  My initial  workup includes robaxin  Additional history obtained from: Nursing notes from this visit. Previous records within [facility] emergency department visit today for same.  He was advised to continue taking Tylenol and ibuprofen and to follow-up with a primary care provider  Afebrile, hemodynamically stable.  56 year old male presenting to the ED for evaluation of what is now chronic post op Pain.  Pain is localized to the mouth and throat where he had his tongue and larynx removed.  Has been prescribed numerous large quantities of oxycodone recently with the most recent prescription being 6 days ago.  He reached out to his otolaryngology team for a refill, however was told that they cannot refill narcotics after 1 month post op.  Has tried to see the pain clinic but was told that he does not have a diagnosis of chronic pain.  Patient has a high narcotic score with numerous recent prescriptions.  We will not send him a prescription for narcotics today.  We will give him Robaxin in the ED and send him a prescription for this and advised him to continue taking Tylenol and ibuprofen for pain.  Strongly encourage patient to obtain a new primary care provider and get a referral for pain management clinic.  Patient has no other complaints and I have low suspicion for acute emergencies.  Physical exam is remarkable for aphasia due to tongue removal.  Patient communicates by writing on a white board. He was given return precautions. Stable at  discharge.  At this time there does not appear to be any evidence of an acute emergency medical condition and the patient appears stable for discharge with appropriate outpatient follow up. Diagnosis was discussed with patient who verbalizes understanding of care plan and is agreeable to discharge. I have discussed return precautions with patient who verbalizes understanding. Patient encouraged to follow-up with their PCP within 1 week. All questions answered.   Note: Portions of this report may have been transcribed using voice recognition software. Every effort was made to ensure accuracy; however, inadvertent computerized transcription errors may still be present.          Final Clinical Impression(s) / ED Diagnoses Final diagnoses:  None    Rx / DC Orders ED Discharge Orders     None         Roylene Reason, Hershal Coria 12/09/21 1446    Godfrey Pick, MD 12/09/21 [phone]

## 2022-01-15 ENCOUNTER — Emergency Department (HOSPITAL_COMMUNITY)
Admission: EM | Admit: 2022-01-15 | Discharge: 2022-01-15 | Disposition: A | Discharge status: Home / Self Care | Payer: Medicare Other | Attending: Emergency Medicine | Admitting: Emergency Medicine

## 2022-01-15 ENCOUNTER — Other Ambulatory Visit: Payer: Self-pay

## 2022-01-15 ENCOUNTER — Encounter (HOSPITAL_COMMUNITY): Payer: Self-pay

## 2022-01-15 ENCOUNTER — Emergency Department (HOSPITAL_COMMUNITY): Payer: Medicare Other

## 2022-01-15 DIAGNOSIS — W01198A Fall on same level from slipping, tripping and stumbling with subsequent striking against other object, initial encounter: Secondary | ICD-10-CM | POA: Insufficient documentation

## 2022-01-15 DIAGNOSIS — E119 Type 2 diabetes mellitus without complications: Secondary | ICD-10-CM | POA: Insufficient documentation

## 2022-01-15 DIAGNOSIS — S20212A Contusion of left front wall of thorax, initial encounter: Secondary | ICD-10-CM | POA: Insufficient documentation

## 2022-01-15 DIAGNOSIS — Z7984 Long term (current) use of oral hypoglycemic drugs: Secondary | ICD-10-CM | POA: Insufficient documentation

## 2022-01-15 DIAGNOSIS — Z8673 Personal history of transient ischemic attack (TIA), and cerebral infarction without residual deficits: Secondary | ICD-10-CM | POA: Diagnosis not present

## 2022-01-15 DIAGNOSIS — Z8501 Personal history of malignant neoplasm of esophagus: Secondary | ICD-10-CM | POA: Diagnosis not present

## 2022-01-15 DIAGNOSIS — R0781 Pleurodynia: Secondary | ICD-10-CM | POA: Diagnosis present

## 2022-01-15 MED ORDER — OXYCODONE-ACETAMINOPHEN 5-325 MG PO TABS: 1.0000 | ORAL_TABLET | ORAL | 0 refills | Status: DC | PRN

## 2022-01-15 NOTE — ED Triage Notes (Signed)
Pt reports tripped over his walker and fell last night landing on chest.  C/O left rib pain.

## 2022-01-15 NOTE — Discharge Instructions (Signed)
Use your incentive spirometer 3-4 times a day.  Use a pillow or small folded towel held to your side to cough or deep breaths.  Follow-up with your primary care provider for recheck, return to the emergency department for any new or worsening symptoms.

## 2022-01-15 NOTE — ED Provider Notes (Signed)
[facility] Provider Note   CSN: [id number] Arrival date & time: 01/15/22  1408     History  Chief Complaint  Patient presents with   [name] is a 56 y.o. male.   Fall Associated symptoms include chest pain (Left rib pain). Pertinent negatives include no abdominal pain, no headaches and no shortness of breath.       [name] is a 56 y.o. male with past medical history of type 2 diabetes, prior stroke, chronic pain, anemia, esophageal cancer who had a laryngectomy and a portion of his tongue removed who currently has feeding tube, who presents to the Emergency Department complaining of left anterior rib pain secondary to mechanical fall.  He states that he tripped over his walker last evening and fell on a table.  States the edge of the table struck his left lower ribs.  He has pain associated with movement, cough, and deep breathing.  Denies any shortness of breath, head injury, LOC, dizziness, abdominal pain nausea or vomiting.  Does not currently take blood thinners.  He is nonverbal due to his throat cancer, communicates by writing notes nodding and shaking his head.   Home Medications Prior to Admission medications   Medication Sig Start Date End Date Taking? Authorizing Provider  acetaminophen (TYLENOL) 325 MG tablet Take 1-2 tablets (325-650 mg total) by mouth every 6 (six) hours as needed for mild pain (pain score 1-3 or temp > 100.5). 03/24/19   Persons, Bevely Palmer, PA  feeding supplement, GLUCERNA SHAKE, (GLUCERNA SHAKE) LIQD Take 237 mLs by mouth 3 (three) times daily between meals. 03/24/19   Persons, Bevely Palmer, PA  fluconazole (DIFLUCAN) 200 MG tablet Take 200 mg by mouth daily. 04/18/21   [provider]  gabapentin (NEURONTIN) 300 MG capsule Take 1 capsule (300 mg total) by mouth 3 (three) times daily. 03/24/19   Persons, Bevely Palmer, PA  hydrOXYzine (VISTARIL) 25 MG capsule Take 25 mg by mouth 3 (three) times daily. 04/17/21    [provider]  ibuprofen (ADVIL) 200 MG tablet Take 200 mg by mouth every 6 (six) hours as needed.    [provider]  lidocaine (XYLOCAINE) 2 % solution Use as directed in the mouth or throat daily at 6 (six) AM. 03/06/21   [provider]  magnesium oxide (MAG-OX) 400 MG tablet Take 1 tablet by mouth 2 (two) times daily. 03/17/21   [provider]  metFORMIN (GLUCOPHAGE) 500 MG tablet Take 2 tablets (1,000 mg total) by mouth 2 (two) times daily with a meal. Patient taking differently: Take 500 mg by mouth 2 (two) times daily with a meal. 06/17/18   Kayleen Memos, DO  methocarbamol (ROBAXIN) 500 MG tablet Take 1 tablet (500 mg total) by mouth 2 (two) times daily. 12/09/21   Schutt, Grafton Folk, PA-C  metoprolol tartrate (LOPRESSOR) 25 MG tablet Take 12.5 mg by mouth 2 (two) times daily. 04/17/21   [provider]  mirtazapine (REMERON) 30 MG tablet Take 30 mg by mouth at bedtime. 04/17/21   [provider]  nystatin (MYCOSTATIN) 100000 UNIT/ML suspension Take 5 mLs by mouth 4 (four) times daily. 04/17/21   [provider]  oxyCODONE 10 MG TABS Take 1-1.5 tablets (10-15 mg total) by mouth every 4 (four) hours as needed for severe pain (pain score 7-10). Patient not taking: Reported on 06/10/2021 03/24/19   Persons, Bevely Palmer, [place]  pantoprazole (PROTONIX) 40 MG tablet Take 1 tablet (40  mg total) by mouth 2 (two) times daily. 06/10/21   Carlan, Chelsea L, NP  potassium chloride SA (KLOR-CON M) 20 MEQ tablet Take 20 mEq by mouth daily. 03/13/21   [provider]  prochlorperazine (COMPAZINE) 10 MG tablet Take 10 mg by mouth daily at 6 (six) AM. 03/21/21   [provider]  QUEtiapine (SEROQUEL) 300 MG tablet Take 300 mg by mouth at bedtime. 02/21/21   [provider]  sennosides-docusate sodium (SENOKOT-S) 8.6-50 MG tablet Take 1 tablet by mouth at bedtime.    [provider]  sucralfate (CARAFATE) 1 GM/10ML suspension  Take by mouth 4 (four) times daily. 04/18/21   [provider]      Allergies    Benadryl [diphenhydramine hcl] and Trazodone and nefazodone    Review of Systems   Review of Systems  Constitutional:  Negative for appetite change and fever.  HENT:  Negative for congestion.   Eyes:  Negative for visual disturbance.  Respiratory:  Negative for chest tightness and shortness of breath.   Cardiovascular:  Positive for chest pain (Left rib pain). Negative for leg swelling.  Gastrointestinal:  Negative for abdominal pain, diarrhea, nausea and vomiting.  Musculoskeletal:  Negative for arthralgias, back pain and neck pain.  Skin:  Negative for wound.  Neurological:  Negative for dizziness, syncope, weakness, numbness and headaches.  Psychiatric/Behavioral:  Negative for confusion.     Physical Exam Updated Vital Signs BP 114/87   Pulse (!) 101   Temp 97.8 F (36.6 C) (Oral)   Resp 18   Ht '6\' 3"'$  (1.905 m)   Wt 68 kg   SpO2 100%   BMI 18.75 kg/m  Physical Exam Vitals and nursing note reviewed.  Constitutional:      General: He is not in acute distress.    Appearance: Normal appearance. He is not ill-appearing or toxic-appearing.  Cardiovascular:     Rate and Rhythm: Normal rate and regular rhythm.     Pulses: Normal pulses.  Pulmonary:     Effort: Pulmonary effort is normal.  Chest:     Chest wall: Tenderness (Focal tenderness to palpation of the anterior lateral left chest wall.  No ecchymosis or abrasion.  No bony deformity or crepitus.) present.  Abdominal:     General: There is no distension.     Palpations: Abdomen is soft.     Tenderness: There is no abdominal tenderness. There is no guarding.     Comments: G tube in place without edema or surrounding erythema.  Skin:    General: Skin is warm.  Neurological:     General: No focal deficit present.     Sensory: No sensory deficit.     Motor: No weakness.     ED Results / Procedures / Treatments   Labs (all  labs ordered are listed, but only abnormal results are displayed) Labs Reviewed - No data to display  EKG EKG Interpretation  Date/Time:  Thursday January 15 2022 14:32:22 EST Ventricular Rate:  100 PR Interval:  158 QRS Duration: 84 QT Interval:  362 QTC Calculation: 466 R Axis:   67 Text Interpretation: Normal sinus rhythm Possible Left atrial enlargement ST changes similar to 2021 Confirmed by Sherwood Gambler [phone]) on 01/15/2022 2:46:54 PM  Radiology DG Ribs Unilateral W/Chest Left  Result Date: 01/15/2022 CLINICAL DATA:  Fall last night landing on chest. Anterior left rib pain. EXAM: LEFT RIBS AND CHEST - 3+ VIEW COMPARISON:  AP chest 05/04/2021, 03/17/2021 FINDINGS: Right chest wall  porta catheter tip overlies the superior aspect of the superior vena cava, similar to prior. Previously there was a tracheostomy tube overlies the midline trachea. There now appears to be a port at the superior aspect of the trachea without the tracheal tube inserted. Cardiac silhouette and mediastinal contours are within normal limits. There is blunting of left costophrenic angle with linear scarring that is unchanged from numerous prior radiographs including 03/17/2021 and 12/31/2018. Improved aeration of the left lower lung compared to 05/04/2021 most recent chest radiograph. Surgical suture is seen within the left lower lung. No definitive left pleural effusion. No pneumothorax. Surgical clips overlie the anterior left neck base. No acute displaced left-sided rib fracture is seen. Moderate left clavicular joint space narrowing and peripheral osteophytosis. IMPRESSION: 1. No acute displaced left-sided rib fracture is seen. No pneumothorax. 2. Left lower lung postsurgical changes with chronic left costophrenic angle scarring. Improved aeration of the left lower lung compared to 05/04/2021 most recent chest radiograph. Electronically Signed   By: Yvonne Kendall M.D.   On: 01/15/2022 15:23     Procedures Procedures    Medications Ordered in ED Medications - No data to display  ED Course/ Medical Decision Making/ A&P                           Medical Decision Making Patient here for evaluation of left rib pain secondary to a mechanical fall.  Tripped over his walker and fell into the edge of a table yesterday.  Has focal pain to the right anterior lower chest wall.  Pain associated with movement and deep breathing.  Denies any shortness of breath abdominal pain head injury or LOC.  On exam patient has focal tenderness left lower anterior chest wall.  No tissue crepitus ecchymosis or abrasion.  Abdomen is soft and nontender.  No increased work of breathing on exam.  Torrential would include pneumothorax, rib fracture, rib contusion.      Amount and/or Complexity of Data Reviewed Radiology: ordered.    Details: X-ray of the left ribs with chest view negative for pneumothorax or acute fracture or displaced rib. ECG/medicine tests: ordered.    Details: EKG shows normal sinus rhythm Discussion of management or test interpretation with external provider(s): Patient here with rib contusion, discussed possible occult fracture as well.  He has incentive spirometer at home.  Has been taking Tylenol without relief.  There is no increased work of breathing on exam.  I feel he is appropriate for discharge home, will provide short course of pain medication and recommendations for incentive spirometer use.  He will follow-up next week with PCP.           Final Clinical Impression(s) / ED Diagnoses Final diagnoses:  Rib contusion, left, initial encounter    Rx / DC Orders ED Discharge Orders     None         Kem Parkinson, PA-C 01/15/22 1630    Wyvonnia Dusky, MD 01/15/22 [phone]

## 2022-01-27 DIAGNOSIS — C01 Malignant neoplasm of base of tongue: Secondary | ICD-10-CM | POA: Diagnosis not present

## 2022-01-29 DIAGNOSIS — K5909 Other constipation: Secondary | ICD-10-CM | POA: Diagnosis not present

## 2022-01-29 DIAGNOSIS — D509 Iron deficiency anemia, unspecified: Secondary | ICD-10-CM | POA: Diagnosis not present

## 2022-01-29 DIAGNOSIS — E1142 Type 2 diabetes mellitus with diabetic polyneuropathy: Secondary | ICD-10-CM | POA: Diagnosis not present

## 2022-01-29 DIAGNOSIS — F5104 Psychophysiologic insomnia: Secondary | ICD-10-CM | POA: Diagnosis not present

## 2022-01-29 DIAGNOSIS — C140 Malignant neoplasm of pharynx, unspecified: Secondary | ICD-10-CM | POA: Diagnosis not present

## 2022-01-29 DIAGNOSIS — M25561 Pain in right knee: Secondary | ICD-10-CM | POA: Diagnosis not present

## 2022-01-29 DIAGNOSIS — Z79899 Other long term (current) drug therapy: Secondary | ICD-10-CM | POA: Diagnosis not present

## 2022-01-29 DIAGNOSIS — M25562 Pain in left knee: Secondary | ICD-10-CM | POA: Diagnosis not present

## 2022-01-29 DIAGNOSIS — E039 Hypothyroidism, unspecified: Secondary | ICD-10-CM | POA: Diagnosis not present

## 2022-01-29 DIAGNOSIS — Z7984 Long term (current) use of oral hypoglycemic drugs: Secondary | ICD-10-CM | POA: Diagnosis not present

## 2022-01-29 DIAGNOSIS — I10 Essential (primary) hypertension: Secondary | ICD-10-CM | POA: Diagnosis not present

## 2022-01-29 DIAGNOSIS — F418 Other specified anxiety disorders: Secondary | ICD-10-CM | POA: Diagnosis not present

## 2022-02-04 DIAGNOSIS — C01 Malignant neoplasm of base of tongue: Secondary | ICD-10-CM | POA: Diagnosis not present

## 2022-02-25 DIAGNOSIS — Z430 Encounter for attention to tracheostomy: Secondary | ICD-10-CM | POA: Diagnosis not present

## 2022-03-16 DIAGNOSIS — Z8589 Personal history of malignant neoplasm of other organs and systems: Secondary | ICD-10-CM | POA: Diagnosis not present

## 2022-03-16 DIAGNOSIS — Z888 Allergy status to other drugs, medicaments and biological substances status: Secondary | ICD-10-CM | POA: Diagnosis not present

## 2022-03-16 DIAGNOSIS — C01 Malignant neoplasm of base of tongue: Secondary | ICD-10-CM | POA: Diagnosis not present

## 2022-03-16 DIAGNOSIS — Z85810 Personal history of malignant neoplasm of tongue: Secondary | ICD-10-CM | POA: Diagnosis not present

## 2022-03-16 DIAGNOSIS — Z79890 Hormone replacement therapy (postmenopausal): Secondary | ICD-10-CM | POA: Diagnosis not present

## 2022-03-16 DIAGNOSIS — Z9002 Acquired absence of larynx: Secondary | ICD-10-CM | POA: Diagnosis not present

## 2022-03-16 DIAGNOSIS — Z7984 Long term (current) use of oral hypoglycemic drugs: Secondary | ICD-10-CM | POA: Diagnosis not present

## 2022-03-16 DIAGNOSIS — E039 Hypothyroidism, unspecified: Secondary | ICD-10-CM | POA: Diagnosis not present

## 2022-03-16 DIAGNOSIS — Z08 Encounter for follow-up examination after completed treatment for malignant neoplasm: Secondary | ICD-10-CM | POA: Diagnosis not present

## 2022-03-16 DIAGNOSIS — Z886 Allergy status to analgesic agent status: Secondary | ICD-10-CM | POA: Diagnosis not present

## 2022-03-16 DIAGNOSIS — Z79899 Other long term (current) drug therapy: Secondary | ICD-10-CM | POA: Diagnosis not present

## 2022-03-16 DIAGNOSIS — Z923 Personal history of irradiation: Secondary | ICD-10-CM | POA: Diagnosis not present

## 2022-03-16 DIAGNOSIS — Z9221 Personal history of antineoplastic chemotherapy: Secondary | ICD-10-CM | POA: Diagnosis not present

## 2022-03-16 DIAGNOSIS — Z87891 Personal history of nicotine dependence: Secondary | ICD-10-CM | POA: Diagnosis not present

## 2022-03-16 DIAGNOSIS — Z885 Allergy status to narcotic agent status: Secondary | ICD-10-CM | POA: Diagnosis not present

## 2022-03-27 DIAGNOSIS — Z430 Encounter for attention to tracheostomy: Secondary | ICD-10-CM | POA: Diagnosis not present

## 2022-03-29 IMAGING — CT CT CERVICAL SPINE W/O CM
3 of 4 series · 12 of 33 positions shown, 14 images · non-contrast
Comparison: None.

CLINICAL DATA: Fall

EXAM:
CT HEAD WITHOUT CONTRAST
CT CERVICAL SPINE WITHOUT CONTRAST
TECHNIQUE: Multidetector CT imaging of the head and cervical spine was
performed following the standard protocol without intravenous
contrast. Multiplanar CT image reconstructions of the cervical spine
were also generated.

[Series 5: sagittal bone · sagittal · 0.26mm/px · 5 of 61 slices shown, 6 images]
[im 21/61  bone]
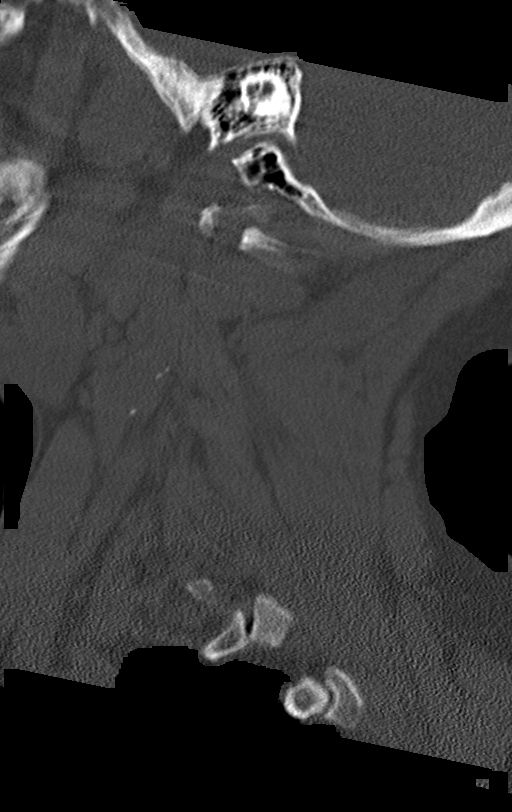
[im 26/61  bone]
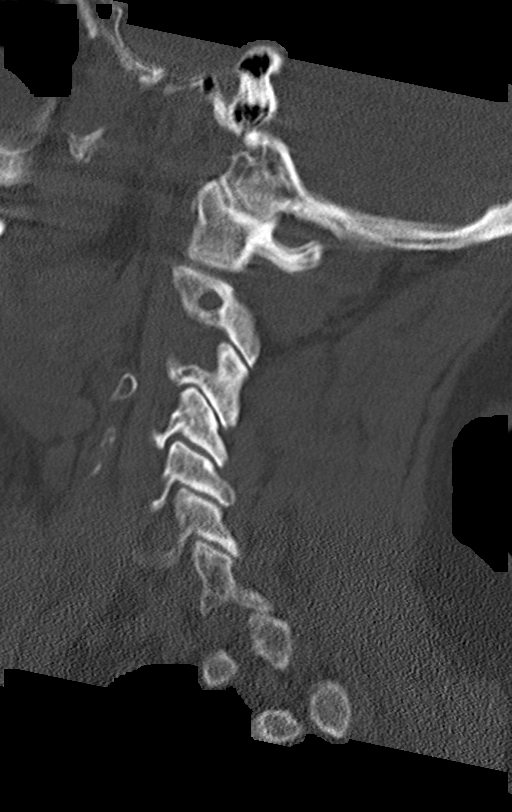
[im 31/61  soft-tissue]
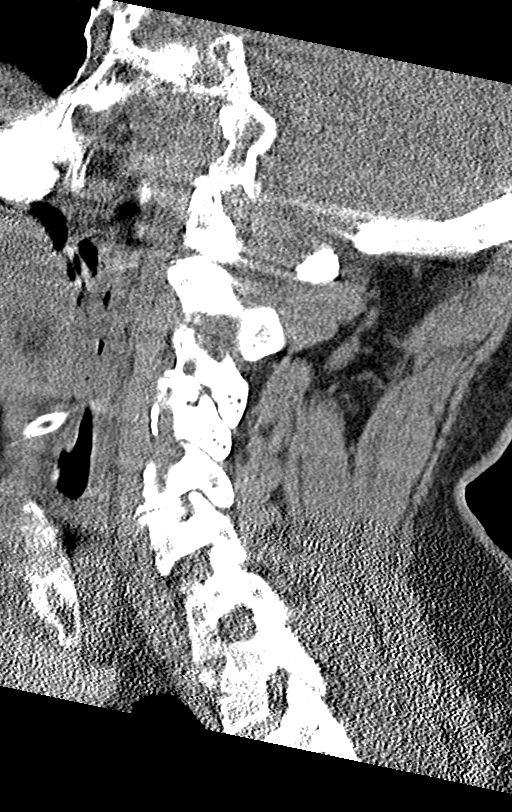
[im 31/61  bone]
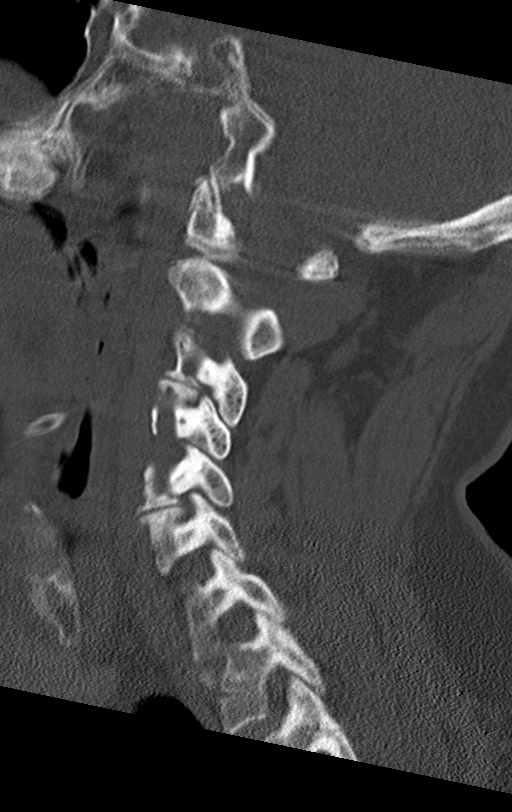
[im 36/61  bone]
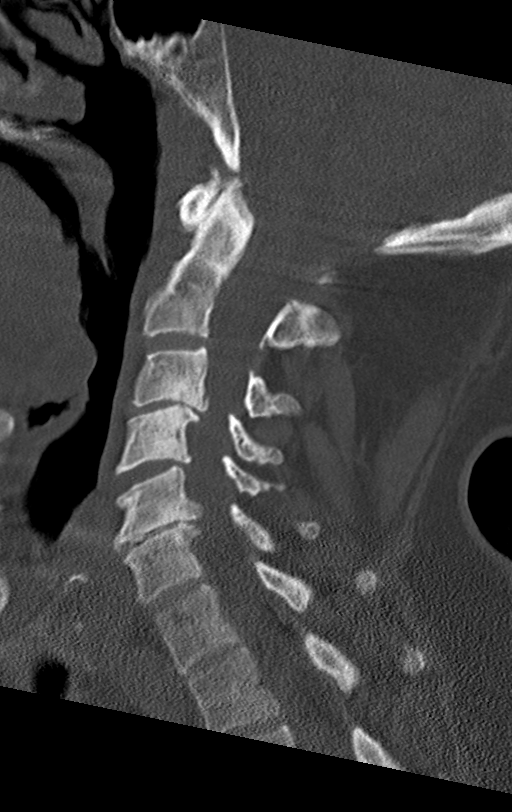
[im 41/61  bone]
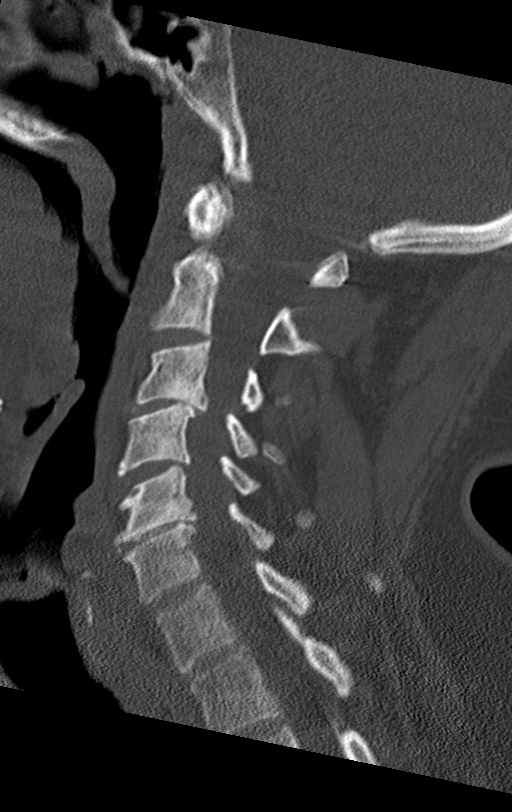

[Series 6: coronal bone · coronal · 0.26mm/px · 3 of 61 slices shown]
[im 13/61  bone]
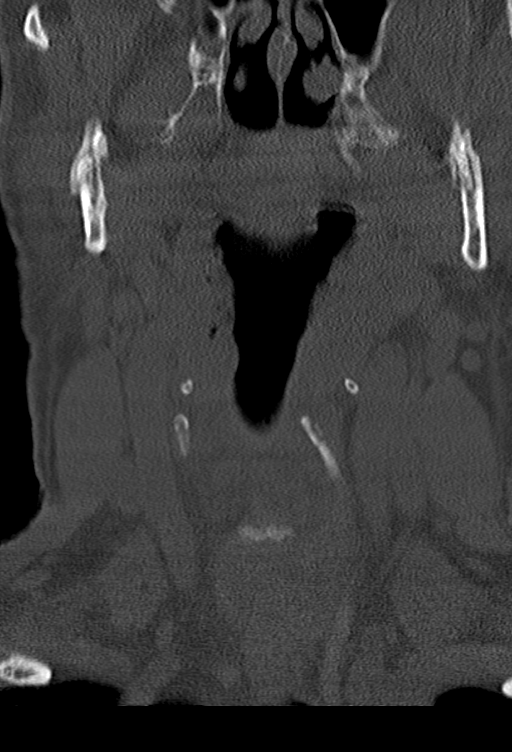
[im 25/61  bone]
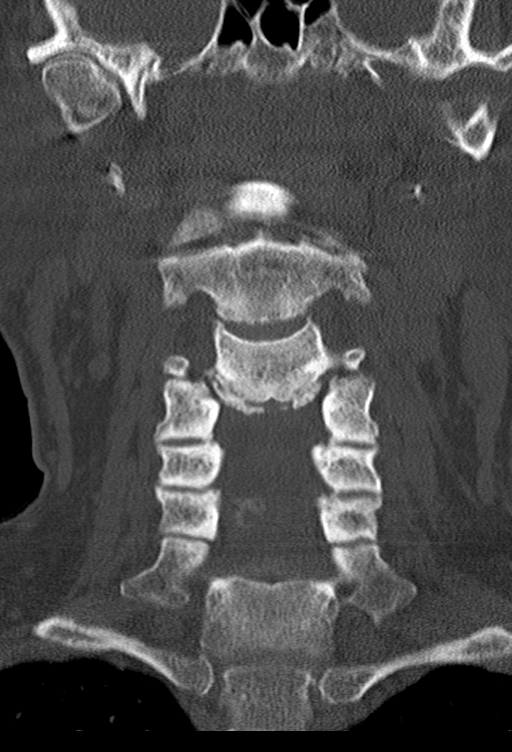
[im 37/61  bone]
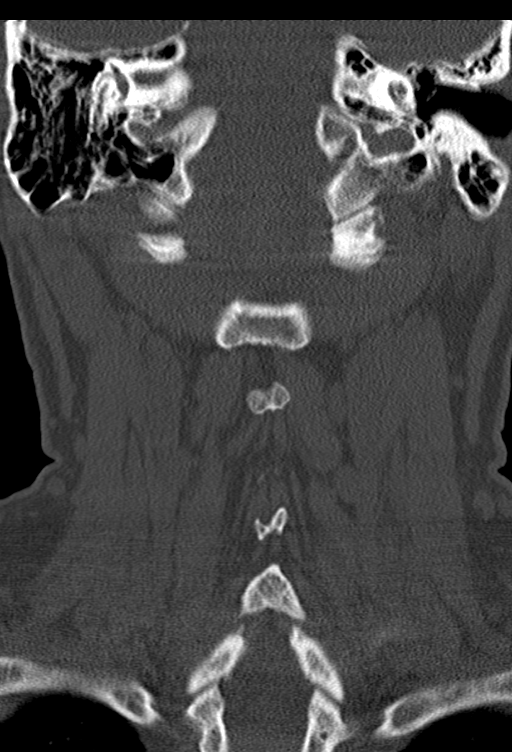

[Series 7: orthogonal axials · axial · 0.21mm/px · z∈[-309,-194]mm · 4 of 85 slices shown, 5 images]
[im 15/85  soft-tissue]
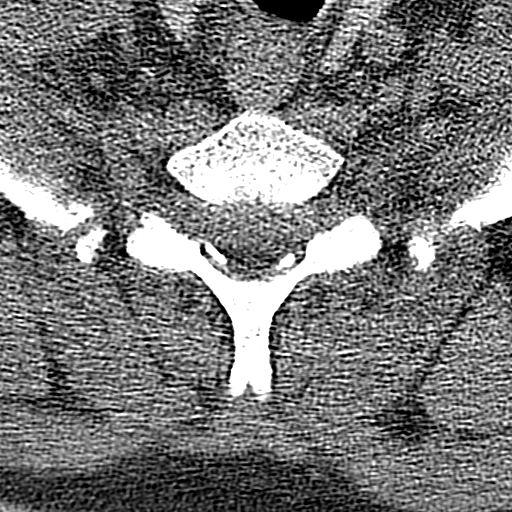
[im 15/85  bone]
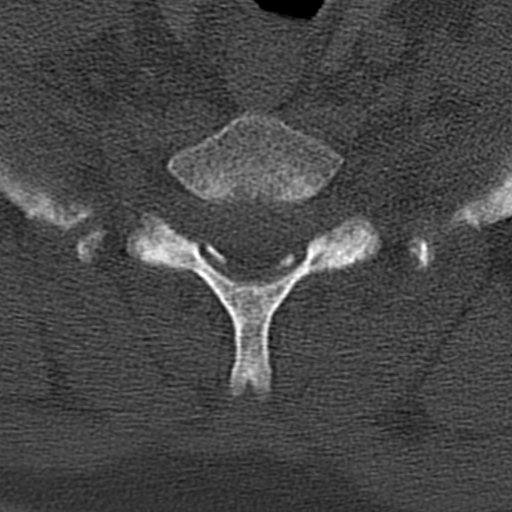
[im 29/85  bone]
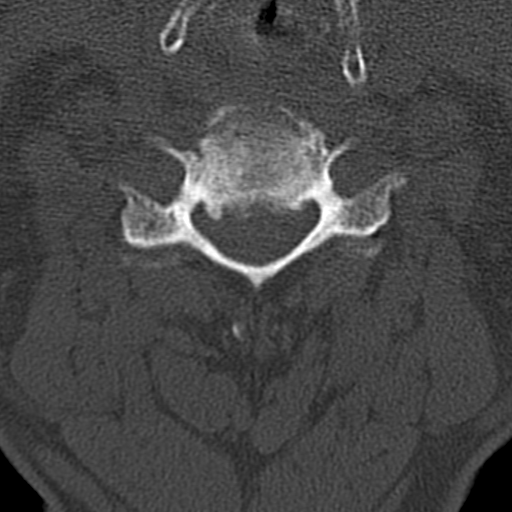
[im 57/85  bone]
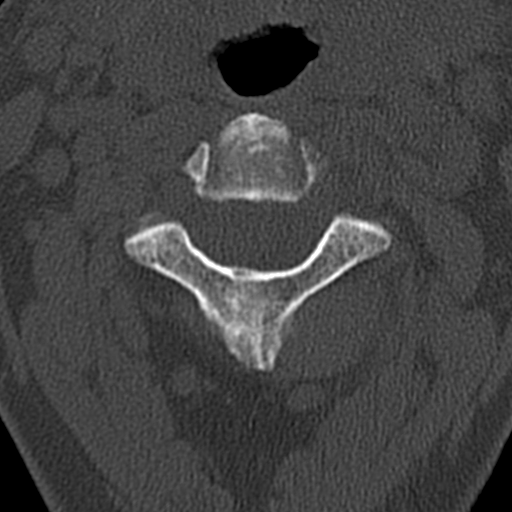
[im 71/85  bone]
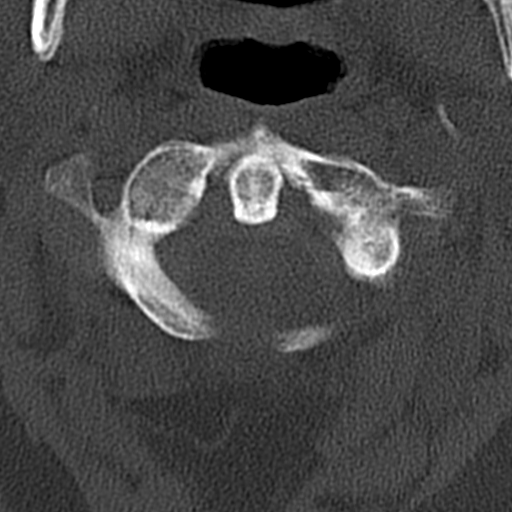

[12 of 33 positions shown; findings below may reference images not displayed]

FINDINGS: CT HEAD FINDINGS

Brain: There is no mass, hemorrhage or extra-axial collection. The
size and configuration of the ventricles and extra-axial CSF spaces
are normal. The brain parenchyma is normal, without evidence of
acute or chronic infarction.

Vascular: No abnormal hyperdensity of the major intracranial
arteries or dural venous sinuses. No intracranial atherosclerosis.

Skull: The visualized skull base, calvarium and extracranial soft
tissues are normal.

Sinuses/Orbits: No fluid levels or advanced mucosal thickening of
the visualized paranasal sinuses. No mastoid or middle ear effusion.
The orbits are normal.

CT CERVICAL SPINE FINDINGS

Alignment: No static subluxation. Facets are aligned. Occipital
condyles are normally positioned.

Skull base and vertebrae: No acute fracture.

Soft tissues and spinal canal: No prevertebral fluid or swelling. No
visible canal hematoma.

Disc levels: Moderate spinal canal stenosis at C3-4 and C5-6.

Upper chest: No pneumothorax, pulmonary nodule or pleural effusion.

Other: Normal visualized paraspinal cervical soft tissues.
IMPRESSION: 1. No acute intracranial abnormality.
2. No acute fracture or static subluxation of the cervical spine.
3. Moderate spinal canal stenosis at C3-4 and C5-6.

## 2022-03-29 IMAGING — DX DG HIP (WITH OR WITHOUT PELVIS) 2-3V*L*
3 series · 3 of 3 positions shown · non-contrast
Comparison: None.

CLINICAL DATA: Pain after fall.

EXAM:
DG HIP (WITH OR WITHOUT PELVIS) 2-3V LEFT

[pelvis ap]
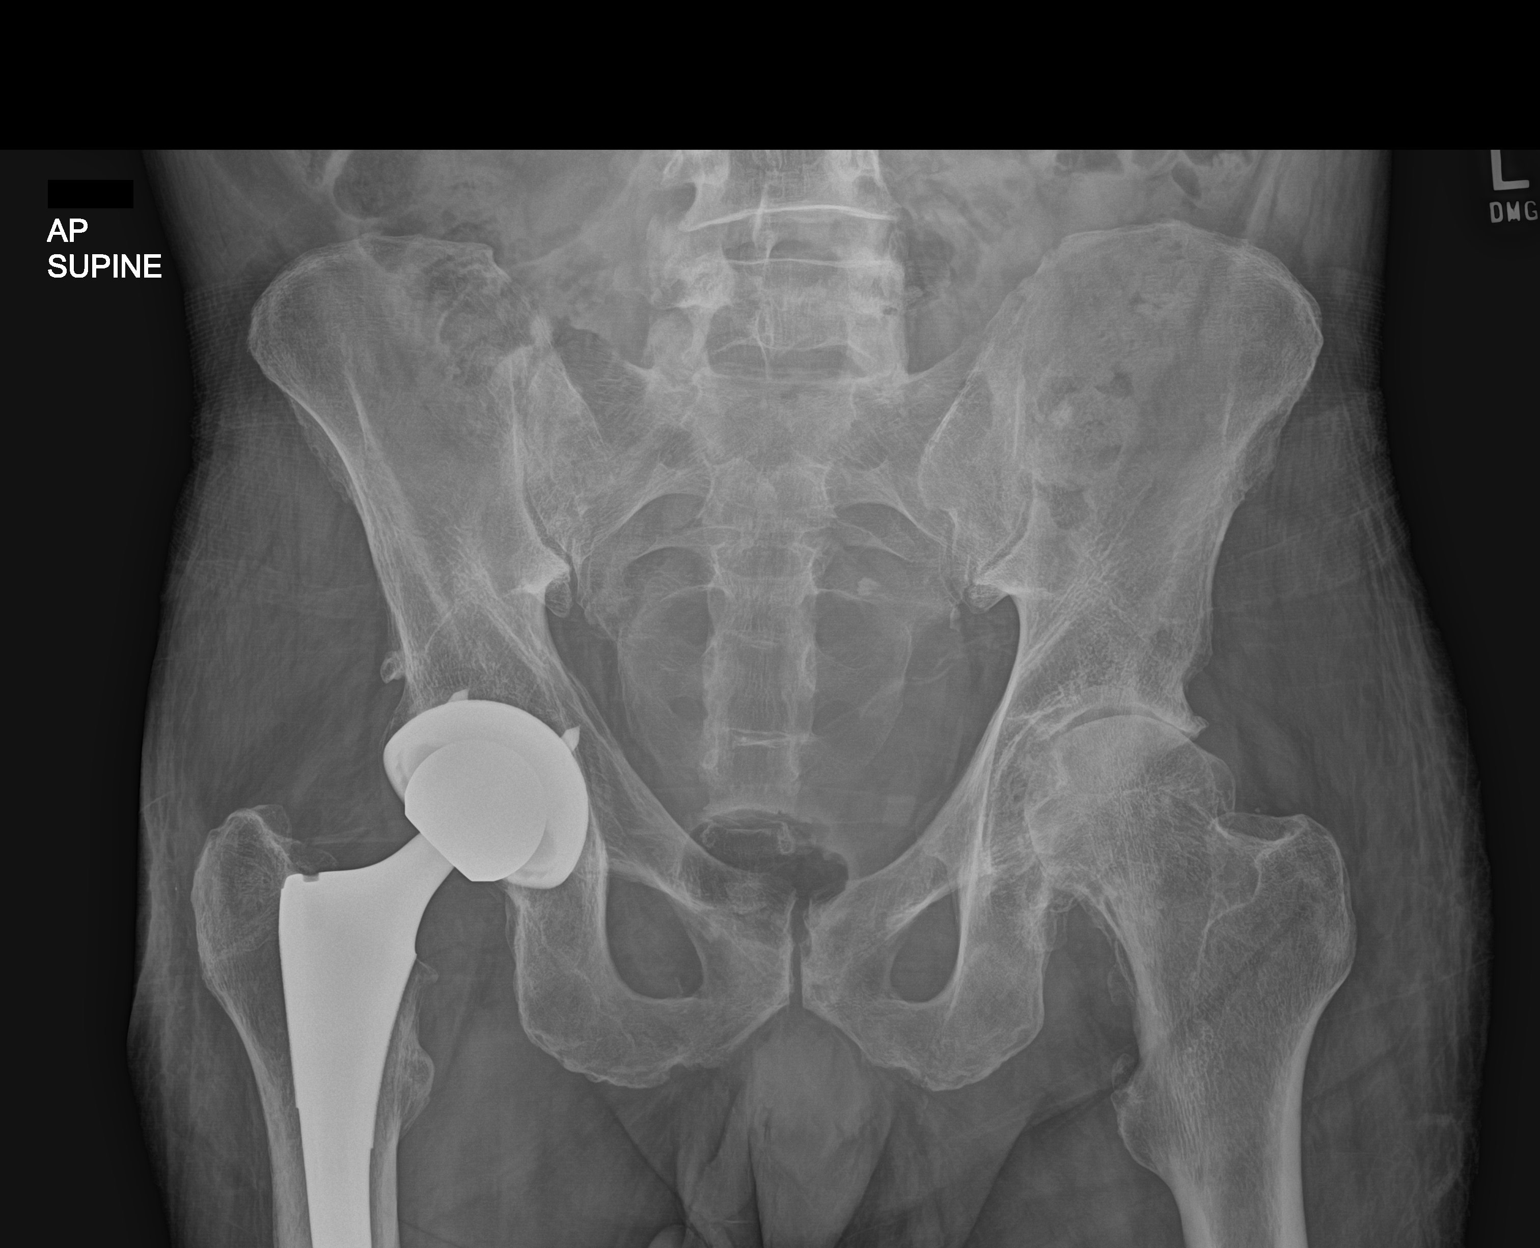

[hip ap]
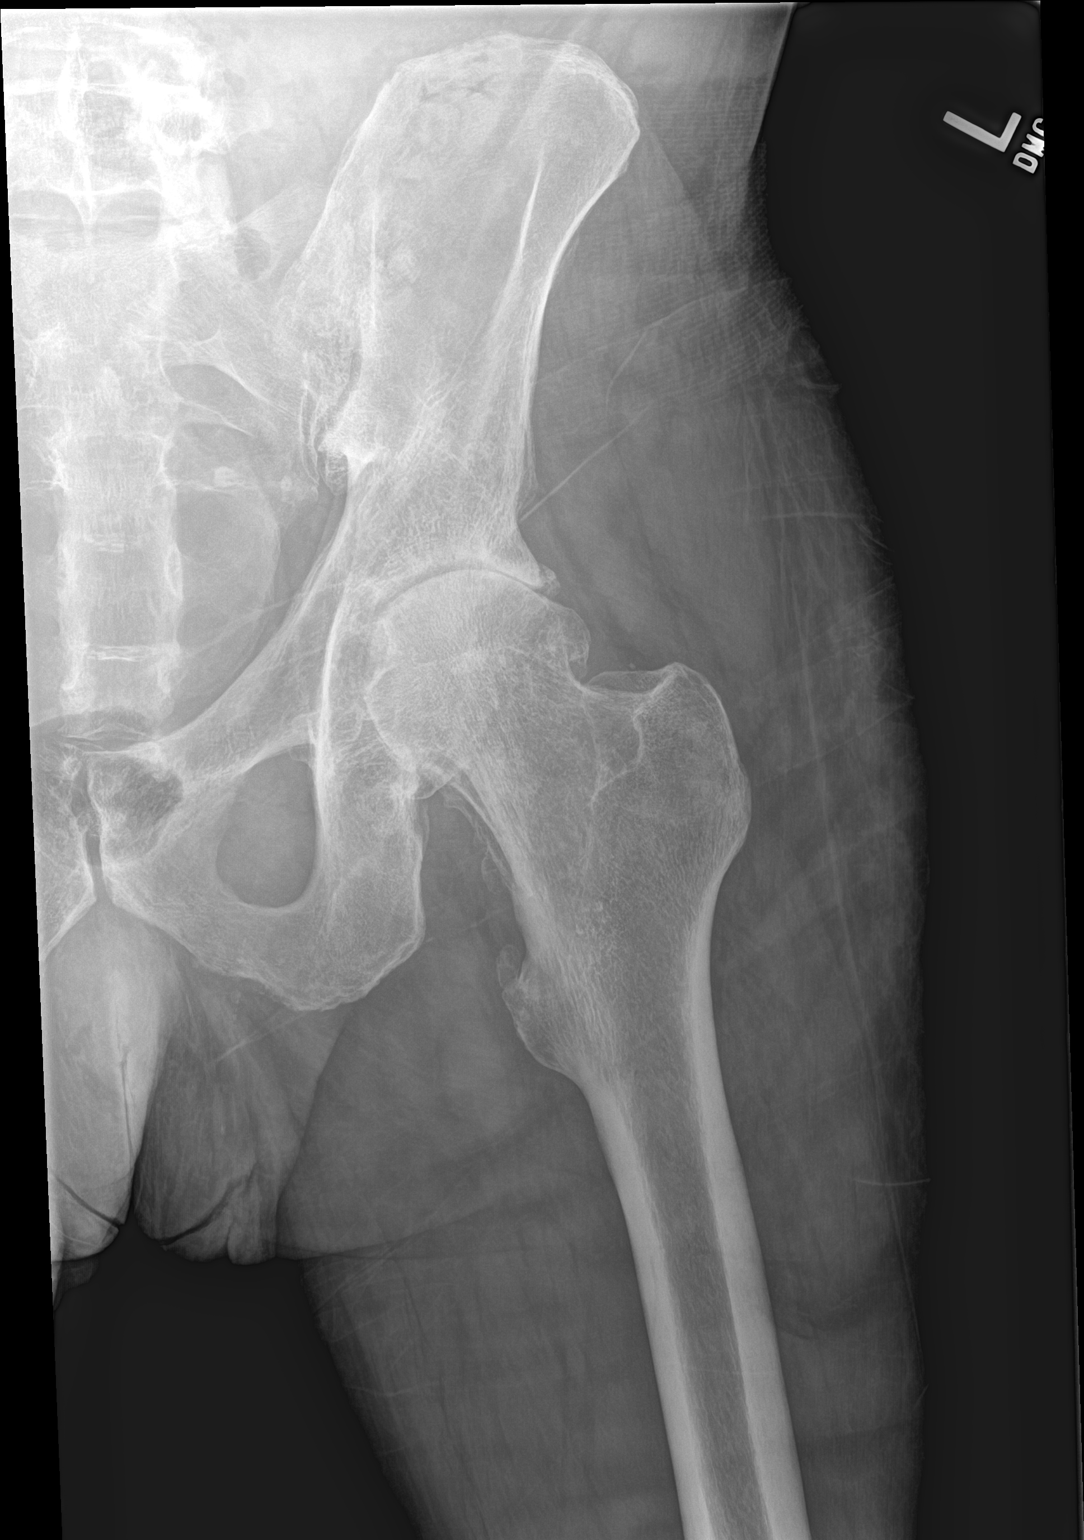

[hip lat]
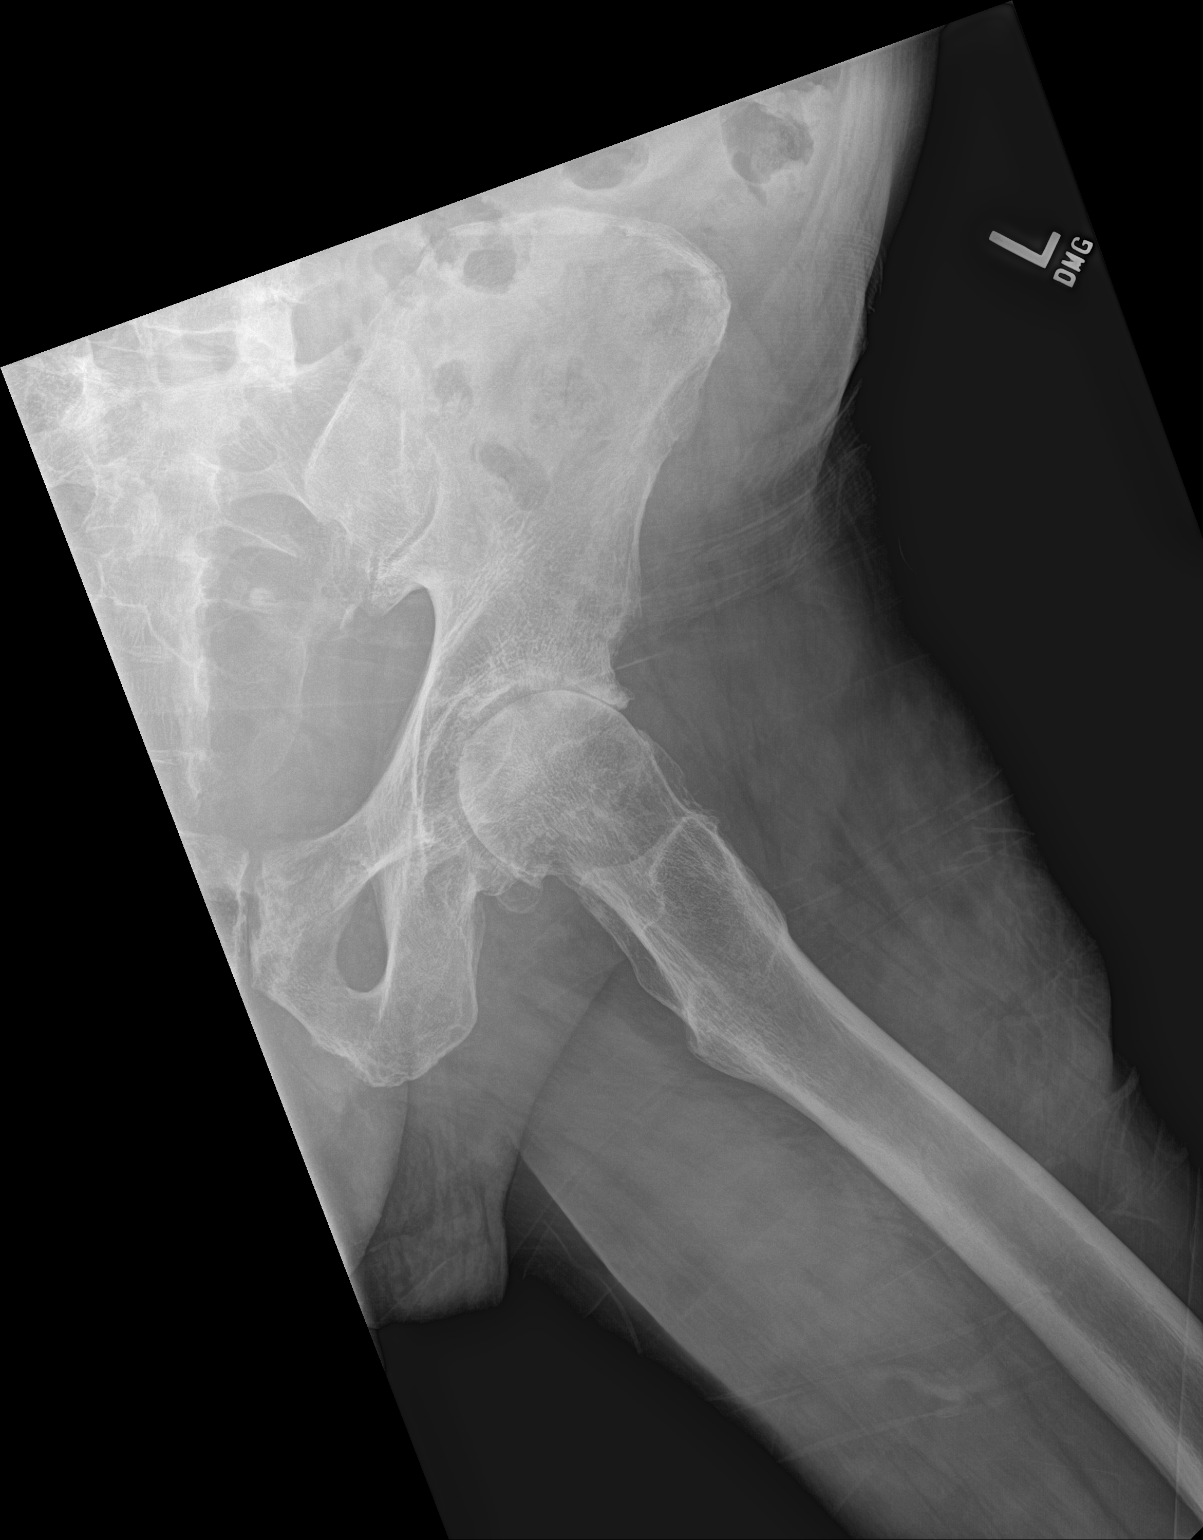

[3 of 3 positions shown; findings below may reference images not displayed]

FINDINGS: The patient is status post right hip replacement. Visualized
hardware is in good position. Moderate degenerative changes are seen
in the left hip. There is some loss of joint space and
osteophytosis. No acute fractures are seen.
IMPRESSION: No acute fractures identified. Moderate degenerative changes in the
left hip.

## 2022-03-29 IMAGING — CT CT HEAD W/O CM
3 series · 15 of 47 positions shown, 18 images · non-contrast
Comparison: None.

CLINICAL DATA: Fall

EXAM:
CT HEAD WITHOUT CONTRAST
CT CERVICAL SPINE WITHOUT CONTRAST
TECHNIQUE: Multidetector CT imaging of the head and cervical spine was
performed following the standard protocol without intravenous
contrast. Multiplanar CT image reconstructions of the cervical spine
were also generated.

[Series 2: head w o · axial · 0.49mm/px · z∈[-155,-5]mm · 9 of 36 slices shown, 12 images]
[im 3/36  brain]
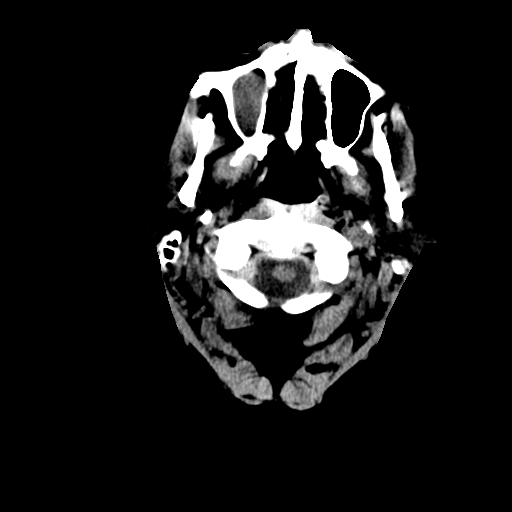
[im 3/36  bone]
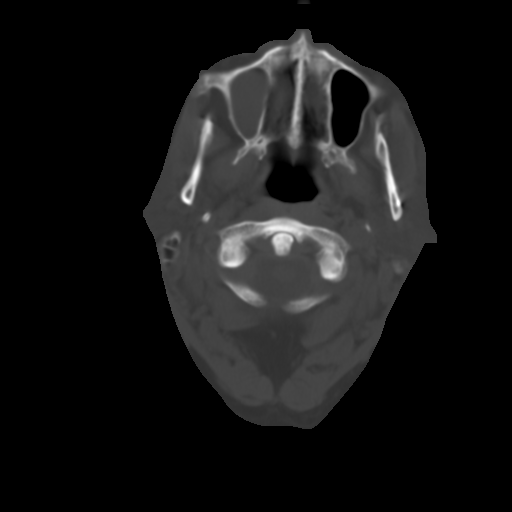
[im 7/36  brain]
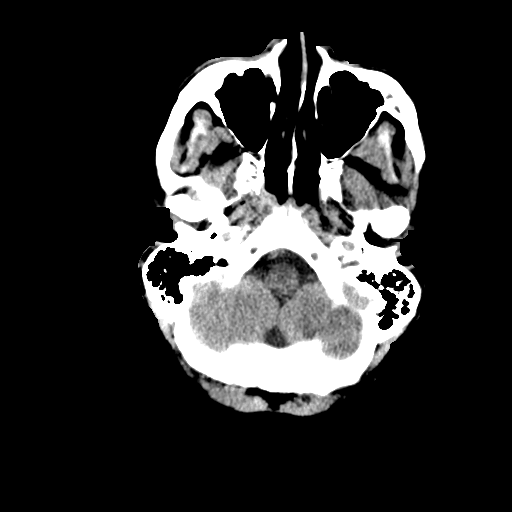
[im 10/36  brain]
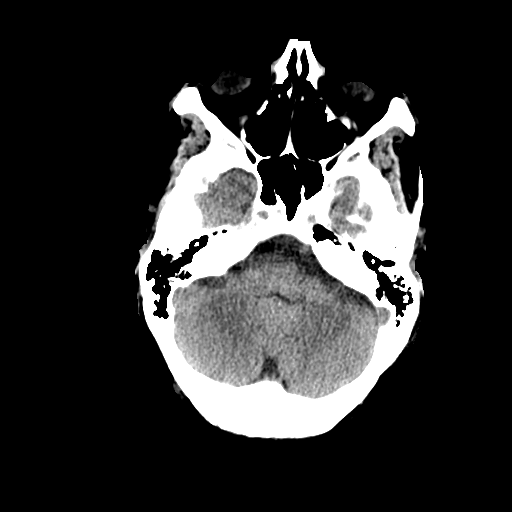
[im 14/36  brain]
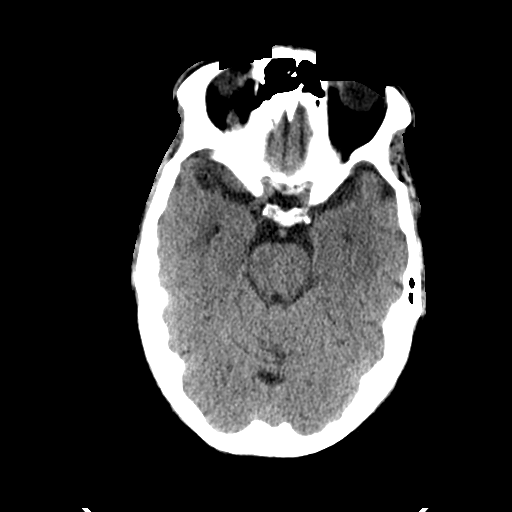
[im 19/36  brain]
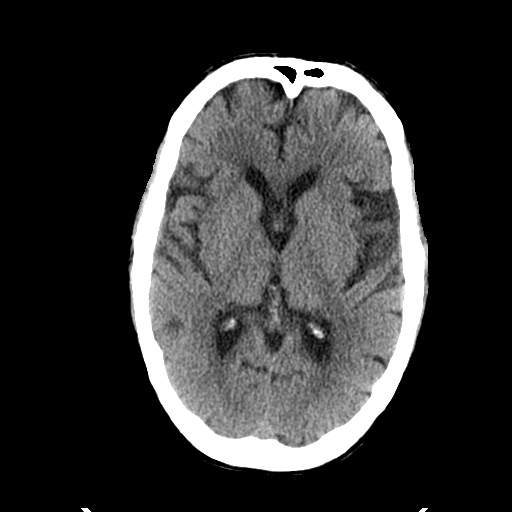
[im 19/36  bone]
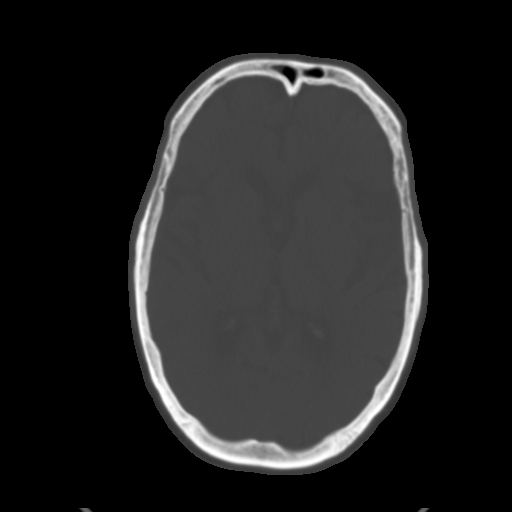
[im 22/36  brain]
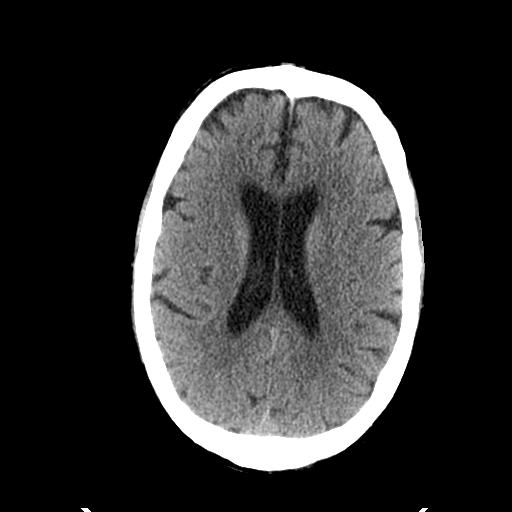
[im 26/36  brain]
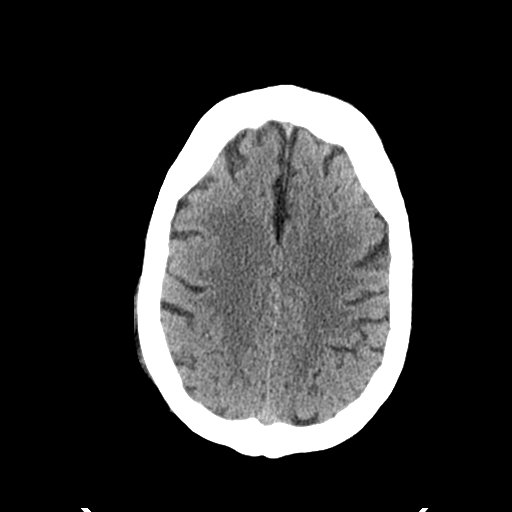
[im 29/36  brain]
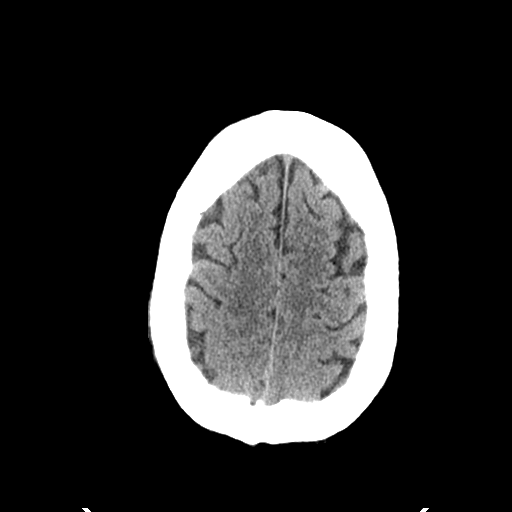
[im 33/36  brain]
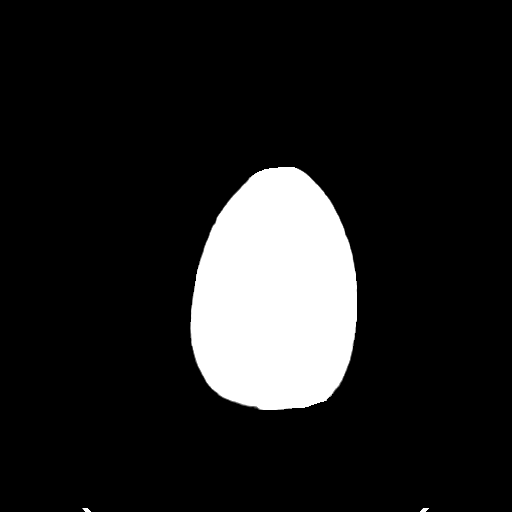
[im 33/36  bone]
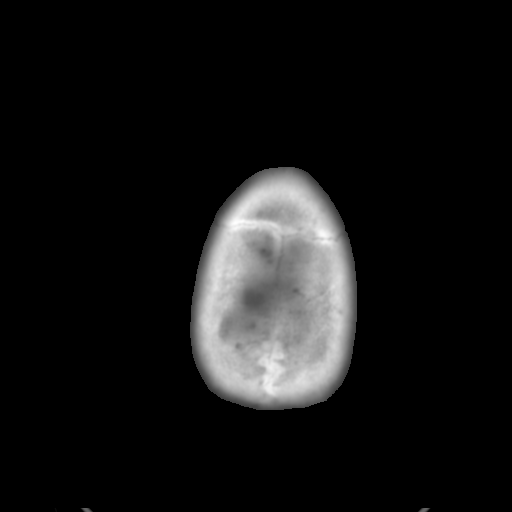

[Series 4: coronal soft · coronal · 0.38mm/px · 3 of 83 slices shown]
[im 28/83  brain]
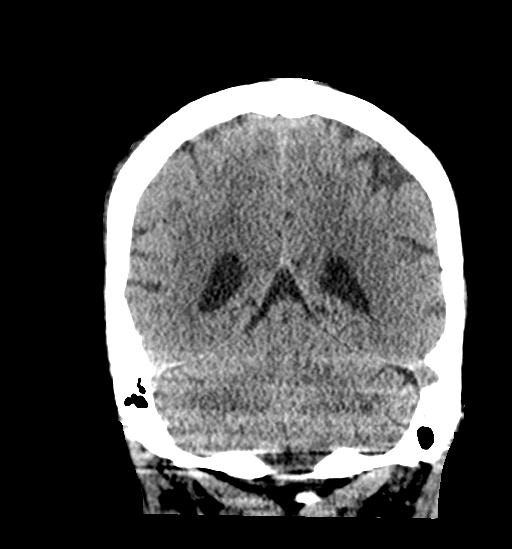
[im 37/83  brain]
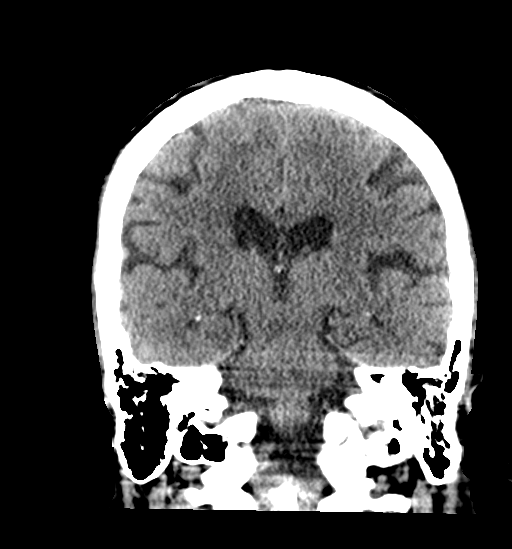
[im 46/83  brain]
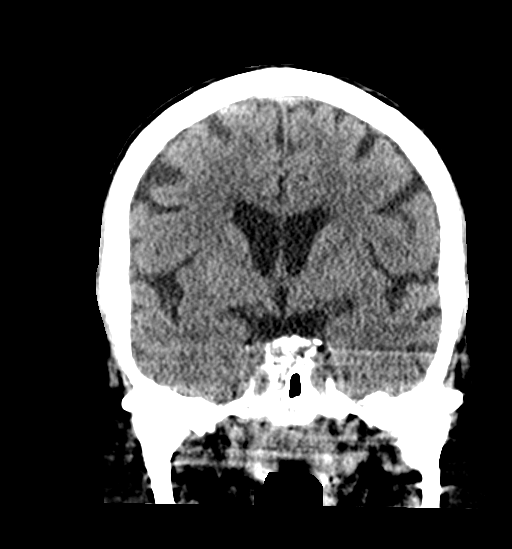

[Series 5: sagittal soft · sagittal · 0.40mm/px · 3 of 65 slices shown]
[im 22/65  brain]
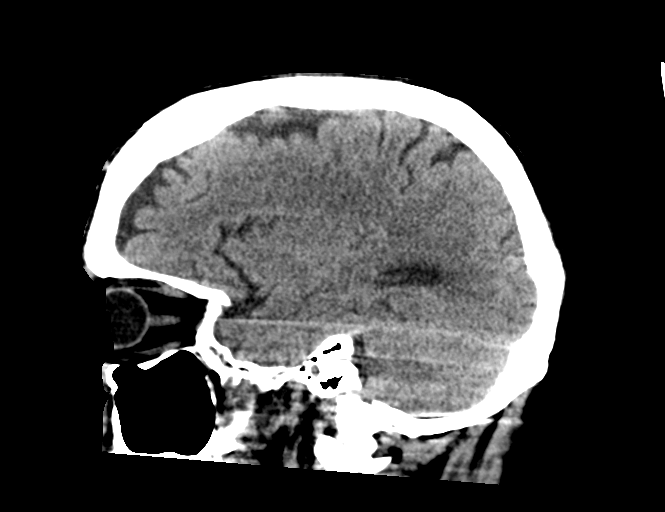
[im 33/65  brain]
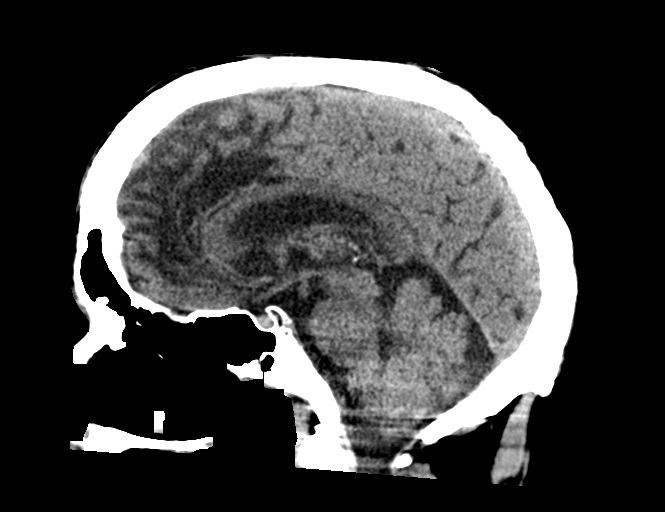
[im 43/65  brain]
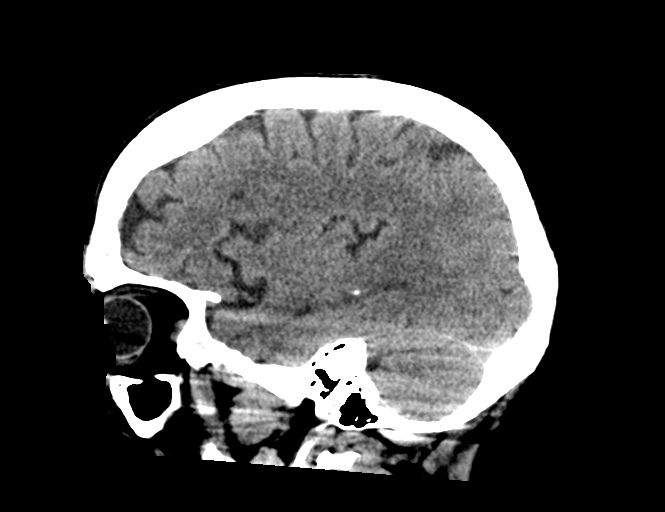

[15 of 47 positions shown; findings below may reference images not displayed]

FINDINGS: CT HEAD FINDINGS

Brain: There is no mass, hemorrhage or extra-axial collection. The
size and configuration of the ventricles and extra-axial CSF spaces
are normal. The brain parenchyma is normal, without evidence of
acute or chronic infarction.

Vascular: No abnormal hyperdensity of the major intracranial
arteries or dural venous sinuses. No intracranial atherosclerosis.

Skull: The visualized skull base, calvarium and extracranial soft
tissues are normal.

Sinuses/Orbits: No fluid levels or advanced mucosal thickening of
the visualized paranasal sinuses. No mastoid or middle ear effusion.
The orbits are normal.

CT CERVICAL SPINE FINDINGS

Alignment: No static subluxation. Facets are aligned. Occipital
condyles are normally positioned.

Skull base and vertebrae: No acute fracture.

Soft tissues and spinal canal: No prevertebral fluid or swelling. No
visible canal hematoma.

Disc levels: Moderate spinal canal stenosis at C3-4 and C5-6.

Upper chest: No pneumothorax, pulmonary nodule or pleural effusion.

Other: Normal visualized paraspinal cervical soft tissues.
IMPRESSION: 1. No acute intracranial abnormality.
2. No acute fracture or static subluxation of the cervical spine.
3. Moderate spinal canal stenosis at C3-4 and C5-6.

## 2022-04-01 DIAGNOSIS — Z452 Encounter for adjustment and management of vascular access device: Secondary | ICD-10-CM | POA: Diagnosis not present

## 2022-04-27 DIAGNOSIS — Z430 Encounter for attention to tracheostomy: Secondary | ICD-10-CM | POA: Diagnosis not present

## 2022-05-11 ENCOUNTER — Emergency Department (HOSPITAL_COMMUNITY): Payer: Medicare (Managed Care)

## 2022-05-11 ENCOUNTER — Other Ambulatory Visit: Payer: Self-pay

## 2022-05-11 ENCOUNTER — Emergency Department (HOSPITAL_COMMUNITY)
Admission: EM | Admit: 2022-05-11 | Discharge: 2022-05-11 | Disposition: A | Discharge status: Home / Self Care | Payer: Medicare (Managed Care) | Attending: Emergency Medicine | Admitting: Emergency Medicine

## 2022-05-11 ENCOUNTER — Encounter (HOSPITAL_COMMUNITY): Payer: Self-pay

## 2022-05-11 DIAGNOSIS — R079 Chest pain, unspecified: Secondary | ICD-10-CM | POA: Diagnosis not present

## 2022-05-11 DIAGNOSIS — M1611 Unilateral primary osteoarthritis, right hip: Secondary | ICD-10-CM | POA: Diagnosis not present

## 2022-05-11 DIAGNOSIS — E860 Dehydration: Secondary | ICD-10-CM | POA: Diagnosis not present

## 2022-05-11 DIAGNOSIS — R4182 Altered mental status, unspecified: Secondary | ICD-10-CM | POA: Insufficient documentation

## 2022-05-11 DIAGNOSIS — Z8521 Personal history of malignant neoplasm of larynx: Secondary | ICD-10-CM | POA: Insufficient documentation

## 2022-05-11 DIAGNOSIS — Z8501 Personal history of malignant neoplasm of esophagus: Secondary | ICD-10-CM | POA: Diagnosis not present

## 2022-05-11 DIAGNOSIS — S7001XA Contusion of right hip, initial encounter: Secondary | ICD-10-CM

## 2022-05-11 DIAGNOSIS — X58XXXA Exposure to other specified factors, initial encounter: Secondary | ICD-10-CM | POA: Insufficient documentation

## 2022-05-11 DIAGNOSIS — J9811 Atelectasis: Secondary | ICD-10-CM | POA: Diagnosis not present

## 2022-05-11 DIAGNOSIS — I6789 Other cerebrovascular disease: Secondary | ICD-10-CM | POA: Diagnosis not present

## 2022-05-11 DIAGNOSIS — R070 Pain in throat: Secondary | ICD-10-CM | POA: Diagnosis not present

## 2022-05-11 DIAGNOSIS — I639 Cerebral infarction, unspecified: Secondary | ICD-10-CM

## 2022-05-11 DIAGNOSIS — R000 Tachycardia, unspecified: Secondary | ICD-10-CM | POA: Diagnosis not present

## 2022-05-11 DIAGNOSIS — R0902 Hypoxemia: Secondary | ICD-10-CM | POA: Diagnosis not present

## 2022-05-11 DIAGNOSIS — R531 Weakness: Secondary | ICD-10-CM | POA: Diagnosis not present

## 2022-05-11 DIAGNOSIS — M25551 Pain in right hip: Secondary | ICD-10-CM | POA: Diagnosis present

## 2022-05-11 LAB — COMPREHENSIVE METABOLIC PANEL
ALT: 57 U/L — ABNORMAL HIGH (ref 0–44)
AST: 52 U/L — ABNORMAL HIGH (ref 15–41)
Albumin: 4.1 g/dL (ref 3.5–5.0)
Alkaline Phosphatase: 81 U/L (ref 38–126)
Anion gap: 15 (ref 5–15)
BUN: 24 mg/dL — ABNORMAL HIGH (ref 6–20)
CO2: 22 mmol/L (ref 22–32)
Calcium: 9.2 mg/dL (ref 8.9–10.3)
Chloride: 89 mmol/L — ABNORMAL LOW (ref 98–111)
Creatinine, Ser: 1.03 mg/dL (ref 0.61–1.24)
GFR, Estimated: >60 mL/min (ref >60)
Glucose, Bld: 89 mg/dL (ref 70–99)
Potassium: 3.5 mmol/L (ref 3.5–5.1)
Sodium: 126 mmol/L — ABNORMAL LOW (ref 135–145)
Total Bilirubin: 1.2 mg/dL (ref 0.3–1.2)
Total Protein: 7.3 g/dL (ref 6.5–8.1)

## 2022-05-11 LAB — CBG MONITORING, ED: Glucose-Capillary: 95 mg/dL (ref 70–99)

## 2022-05-11 LAB — CBC
HCT: 34.1 % — ABNORMAL LOW (ref 39.0–52.0)
Hemoglobin: 11.4 g/dL — ABNORMAL LOW (ref 13.0–17.0)
MCH: 28.9 pg (ref 26.0–34.0)
MCHC: 33.4 g/dL (ref 30.0–36.0)
MCV: 86.3 fL (ref 80.0–100.0)
Platelets: 201 10*3/uL (ref 150–400)
RBC: 3.95 MIL/uL — ABNORMAL LOW (ref 4.22–5.81)
RDW: 14.1 % (ref 11.5–15.5)
WBC: 4.9 10*3/uL (ref 4.0–10.5)
nRBC: 0 % (ref 0.0–0.2)

## 2022-05-11 MED ORDER — SODIUM CHLORIDE 0.9 % IV BOLUS
2000.0000 mL | Freq: Once | INTRAVENOUS | Status: AC
  Administered 2022-05-11 (×2): 1000 mL via INTRAVENOUS

## 2022-05-11 NOTE — ED Triage Notes (Signed)
EMS reports pt son says pt has not been acting like himself and not taking his tube feeds like he should.  Pt reports pain around his trach stoma and right hip.

## 2022-05-11 NOTE — ED Provider Notes (Signed)
[place] EMERGENCY DEPARTMENT AT [facility] Provider Note   CSN: [id number] Arrival date & time: 05/11/22  1733     History {Add pertinent medical, surgical, social history, OB history to HPI:1} Chief Complaint  Patient presents with   Altered Mental Status    [name] is a 57 y.o. male.  Patient has a history of esophageal cancer he has a stoma.  Patient has right hip pain and has not been taking as much through his PEG tube for his feedings.   Weakness      Home Medications Prior to Admission medications   Medication Sig Start Date End Date Taking? Authorizing Provider  acetaminophen (TYLENOL) 325 MG tablet Take 1-2 tablets (325-650 mg total) by mouth every 6 (six) hours as needed for mild pain (pain score 1-3 or temp > 100.5). 03/24/19   Persons, [place], PA  feeding supplement, GLUCERNA SHAKE, (GLUCERNA SHAKE) LIQD Take 237 mLs by mouth 3 (three) times daily between meals. 03/24/19   Persons, [place], PA  fluconazole (DIFLUCAN) 200 MG tablet Take 200 mg by mouth daily. 04/18/21   [provider]  gabapentin (NEURONTIN) 300 MG capsule Take 1 capsule (300 mg total) by mouth 3 (three) times daily. 03/24/19   Persons, [place], PA  hydrOXYzine (VISTARIL) 25 MG capsule Take 25 mg by mouth 3 (three) times daily. 04/17/21   [provider]  ibuprofen (ADVIL) 200 MG tablet Take 200 mg by mouth every 6 (six) hours as needed.    [provider]  lidocaine (XYLOCAINE) 2 % solution Use as directed in the mouth or throat daily at 6 (six) AM. 03/06/21   [provider]  magnesium oxide (MAG-OX) 400 MG tablet Take 1 tablet by mouth 2 (two) times daily. 03/17/21   [provider]  metFORMIN (GLUCOPHAGE) 500 MG tablet Take 2 tablets (1,000 mg total) by mouth 2 (two) times daily with a meal. Patient taking differently: Take 500 mg by mouth 2 (two) times daily with a meal. 06/17/18   Darlin Drop, DO  methocarbamol (ROBAXIN) 500 MG  tablet Take 1 tablet (500 mg total) by mouth 2 (two) times daily. 12/09/21   Schutt, Edsel Petrin, PA-C  metoprolol tartrate (LOPRESSOR) 25 MG tablet Take 12.5 mg by mouth 2 (two) times daily. 04/17/21   [provider]  mirtazapine (REMERON) 30 MG tablet Take 30 mg by mouth at bedtime. 04/17/21   [provider]  nystatin (MYCOSTATIN) 100000 UNIT/ML suspension Take 5 mLs by mouth 4 (four) times daily. 04/17/21   [provider]  oxyCODONE-acetaminophen (PERCOCET/ROXICET) 5-325 MG tablet Take 1 tablet by mouth every 4 (four) hours as needed. 01/15/22   Triplett, Tammy, PA-C  pantoprazole (PROTONIX) 40 MG tablet Take 1 tablet (40 mg total) by mouth 2 (two) times daily. 06/10/21   Carlan, Chelsea L, NP  potassium chloride SA (KLOR-CON M) 20 MEQ tablet Take 20 mEq by mouth daily. 03/13/21   [provider]  prochlorperazine (COMPAZINE) 10 MG tablet Take 10 mg by mouth daily at 6 (six) AM. 03/21/21   [provider]  QUEtiapine (SEROQUEL) 300 MG tablet Take 300 mg by mouth at bedtime. 02/21/21   [provider]  sennosides-docusate sodium (SENOKOT-S) 8.6-50 MG tablet Take 1 tablet by mouth at bedtime.    [provider]  sucralfate (CARAFATE) 1 GM/10ML suspension Take by mouth 4 (four) times daily. 04/18/21   [provider]      Allergies  Benadryl [diphenhydramine hcl] and Trazodone and nefazodone    Review of Systems   Review of Systems  Neurological:  Positive for weakness.    Physical Exam Updated Vital Signs BP (!) 141/84   Pulse 88   Temp 97.6 F (36.4 C) (Oral)   Resp 17   Wt 68 kg   SpO2 100%   BMI 18.74 kg/m  Physical Exam  ED Results / Procedures / Treatments   Labs (all labs ordered are listed, but only abnormal results are displayed) Labs Reviewed  COMPREHENSIVE METABOLIC PANEL - Abnormal; Notable for the following components:      Result Value   Sodium 126 (*)    Chloride 89 (*)    BUN 24 (*)    AST 52  (*)    ALT 57 (*)    All other components within normal limits  CBC - Abnormal; Notable for the following components:   RBC 3.95 (*)    Hemoglobin 11.4 (*)    HCT 34.1 (*)    All other components within normal limits  CBG MONITORING, ED    EKG None  Radiology CT Head Wo Contrast  Result Date: 05/11/2022 CLINICAL DATA:  Altered mental status. EXAM: CT HEAD WITHOUT CONTRAST TECHNIQUE: Contiguous axial images were obtained from the base of the skull through the vertex without intravenous contrast. RADIATION DOSE REDUCTION: This exam was performed according to the departmental dose-optimization program which includes automated exposure control, adjustment of the mA and/or kV according to patient size and/or use of iterative reconstruction technique. COMPARISON:  July 15, 2019 FINDINGS: Brain: There is mild cerebral atrophy with widening of the extra-axial spaces and ventricular dilatation. There are areas of decreased attenuation within the white matter tracts of the supratentorial brain, consistent with microvascular disease changes. Vascular: No hyperdense vessel or unexpected calcification. Skull: Normal. Negative for fracture or focal lesion. Sinuses/Orbits: A stable, 2.8 cm x 1.8 cm right maxillary sinus polyp versus mucous retention cyst is seen. Other: None. IMPRESSION: 1. No acute intracranial abnormality. 2. Generalized cerebral atrophy and microvascular disease changes of the supratentorial brain. Electronically Signed   By: Aram Candela M.D.   On: 05/11/2022 22:10   DG Chest Port 1 View  Result Date: 05/11/2022 CLINICAL DATA:  Pain EXAM: PORTABLE CHEST 1 VIEW COMPARISON:  01/15/2022 FINDINGS: Linear left base atelectasis. Right lung clear. Heart mediastinal contours within normal limits. No effusions or acute bony abnormality. Right Port-A-Cath remains in place, unchanged. IMPRESSION: Left base atelectasis. Electronically Signed   By: Charlett Nose M.D.   On: 05/11/2022 21:19   DG  Hip Unilat W or Wo Pelvis 2-3 Views Right  Result Date: 05/11/2022 CLINICAL DATA:  Pain EXAM: DG HIP (WITH OR WITHOUT PELVIS) 2-3V RIGHT COMPARISON:  None Available. FINDINGS: Prior right hip replacement. Moderate to advanced degenerative changes in the left hip with joint space loss and spurring. No acute bony abnormality. Specifically, no fracture, subluxation, or dislocation. SI joints symmetric and unremarkable. IMPRESSION: Right hip replacement. Moderate to advanced degenerative changes in the left hip. No acute bony abnormality. Electronically Signed   By: Charlett Nose M.D.   On: 05/11/2022 21:19    Procedures Procedures  {Document cardiac monitor, telemetry assessment procedure when appropriate:1}  Medications Ordered in ED Medications  sodium chloride 0.9 % bolus 2,000 mL (1,000 mLs Intravenous New Bag/Given 05/11/22 2147)    ED Course/ Medical Decision Making/ A&P   {   Click here for ABCD2, HEART and other calculatorsREFRESH Note before signing :  1}                          Medical Decision Making Amount and/or Complexity of Data Reviewed Labs: ordered. Radiology: ordered.   Patient with dehydration and contusion right hip.  He has been hydrated in the emergency department and will follow-up with his PCP  {Document critical care time when appropriate:1} {Document review of labs and clinical decision tools ie heart score, Chads2Vasc2 etc:1}  {Document your independent review of radiology images, and any outside records:1} {Document your discussion with family members, caretakers, and with consultants:1} {Document social determinants of health affecting pt's care:1} {Document your decision making why or why not admission, treatments were needed:1} Final Clinical Impression(s) / ED Diagnoses Final diagnoses:  Dehydration  Contusion of right hip, initial encounter    Rx / DC Orders ED Discharge Orders     None

## 2022-05-11 NOTE — ED Notes (Addendum)
PC from pt's son to check on pt's status. Confirmed contact info on son. Advised pt was stable and currently in x-ray. Pt returned from x-ray.

## 2022-05-11 NOTE — Discharge Instructions (Signed)
Drink plenty of fluids.  Follow-up with your family doctor either the end of this week or beginning of next week for recheck on your hydration

## 2022-05-11 NOTE — ED Notes (Signed)
Patient transported to CT via stretcher.

## 2022-05-11 NOTE — ED Notes (Signed)
Patient transported to X-ray via stretcher 

## 2022-05-14 DIAGNOSIS — C01 Malignant neoplasm of base of tongue: Secondary | ICD-10-CM | POA: Diagnosis not present

## 2022-05-14 DIAGNOSIS — E440 Moderate protein-calorie malnutrition: Secondary | ICD-10-CM | POA: Diagnosis not present

## 2022-05-25 DIAGNOSIS — C07 Malignant neoplasm of parotid gland: Secondary | ICD-10-CM | POA: Diagnosis not present

## 2022-05-27 DIAGNOSIS — Z930 Tracheostomy status: Secondary | ICD-10-CM | POA: Diagnosis not present

## 2022-05-27 DIAGNOSIS — Z990 Dependence on aspirator: Secondary | ICD-10-CM | POA: Diagnosis not present

## 2022-05-27 DIAGNOSIS — Z9002 Acquired absence of larynx: Secondary | ICD-10-CM | POA: Diagnosis not present

## 2022-05-27 DIAGNOSIS — Z430 Encounter for attention to tracheostomy: Secondary | ICD-10-CM | POA: Diagnosis not present

## 2022-05-29 DIAGNOSIS — Z931 Gastrostomy status: Secondary | ICD-10-CM | POA: Diagnosis not present

## 2022-05-29 DIAGNOSIS — E878 Other disorders of electrolyte and fluid balance, not elsewhere classified: Secondary | ICD-10-CM | POA: Diagnosis not present

## 2022-05-29 DIAGNOSIS — E119 Type 2 diabetes mellitus without complications: Secondary | ICD-10-CM | POA: Diagnosis not present

## 2022-05-29 DIAGNOSIS — E43 Unspecified severe protein-calorie malnutrition: Secondary | ICD-10-CM | POA: Diagnosis not present

## 2022-05-29 DIAGNOSIS — C01 Malignant neoplasm of base of tongue: Secondary | ICD-10-CM | POA: Diagnosis not present

## 2022-06-03 DIAGNOSIS — E039 Hypothyroidism, unspecified: Secondary | ICD-10-CM | POA: Diagnosis not present

## 2022-06-03 DIAGNOSIS — I10 Essential (primary) hypertension: Secondary | ICD-10-CM | POA: Diagnosis not present

## 2022-06-03 DIAGNOSIS — D509 Iron deficiency anemia, unspecified: Secondary | ICD-10-CM | POA: Diagnosis not present

## 2022-06-03 DIAGNOSIS — E1142 Type 2 diabetes mellitus with diabetic polyneuropathy: Secondary | ICD-10-CM | POA: Diagnosis not present

## 2022-06-06 DIAGNOSIS — E875 Hyperkalemia: Secondary | ICD-10-CM | POA: Diagnosis not present

## 2022-06-06 DIAGNOSIS — R5381 Other malaise: Secondary | ICD-10-CM | POA: Diagnosis not present

## 2022-06-06 DIAGNOSIS — E039 Hypothyroidism, unspecified: Secondary | ICD-10-CM | POA: Diagnosis not present

## 2022-06-06 DIAGNOSIS — R638 Other symptoms and signs concerning food and fluid intake: Secondary | ICD-10-CM | POA: Diagnosis not present

## 2022-06-06 DIAGNOSIS — R339 Retention of urine, unspecified: Secondary | ICD-10-CM | POA: Diagnosis not present

## 2022-06-06 DIAGNOSIS — B958 Unspecified staphylococcus as the cause of diseases classified elsewhere: Secondary | ICD-10-CM | POA: Diagnosis not present

## 2022-06-06 DIAGNOSIS — G4700 Insomnia, unspecified: Secondary | ICD-10-CM | POA: Diagnosis not present

## 2022-06-06 DIAGNOSIS — E119 Type 2 diabetes mellitus without complications: Secondary | ICD-10-CM | POA: Diagnosis not present

## 2022-06-06 DIAGNOSIS — E876 Hypokalemia: Secondary | ICD-10-CM | POA: Diagnosis not present

## 2022-06-06 DIAGNOSIS — Z7901 Long term (current) use of anticoagulants: Secondary | ICD-10-CM | POA: Diagnosis not present

## 2022-06-06 DIAGNOSIS — N390 Urinary tract infection, site not specified: Secondary | ICD-10-CM | POA: Diagnosis not present

## 2022-06-06 DIAGNOSIS — G9341 Metabolic encephalopathy: Secondary | ICD-10-CM | POA: Diagnosis not present

## 2022-06-06 DIAGNOSIS — E860 Dehydration: Secondary | ICD-10-CM | POA: Diagnosis not present

## 2022-06-06 DIAGNOSIS — D631 Anemia in chronic kidney disease: Secondary | ICD-10-CM | POA: Diagnosis not present

## 2022-06-06 DIAGNOSIS — R7989 Other specified abnormal findings of blood chemistry: Secondary | ICD-10-CM | POA: Diagnosis not present

## 2022-06-06 DIAGNOSIS — A411 Sepsis due to other specified staphylococcus: Secondary | ICD-10-CM | POA: Diagnosis not present

## 2022-06-06 DIAGNOSIS — R8271 Bacteriuria: Secondary | ICD-10-CM | POA: Diagnosis not present

## 2022-06-06 DIAGNOSIS — M549 Dorsalgia, unspecified: Secondary | ICD-10-CM | POA: Diagnosis not present

## 2022-06-06 DIAGNOSIS — R1311 Dysphagia, oral phase: Secondary | ICD-10-CM | POA: Diagnosis not present

## 2022-06-06 DIAGNOSIS — I959 Hypotension, unspecified: Secondary | ICD-10-CM | POA: Diagnosis not present

## 2022-06-06 DIAGNOSIS — I10 Essential (primary) hypertension: Secondary | ICD-10-CM | POA: Diagnosis not present

## 2022-06-06 DIAGNOSIS — G9340 Encephalopathy, unspecified: Secondary | ICD-10-CM | POA: Diagnosis not present

## 2022-06-06 DIAGNOSIS — R531 Weakness: Secondary | ICD-10-CM | POA: Diagnosis not present

## 2022-06-06 DIAGNOSIS — R9431 Abnormal electrocardiogram [ECG] [EKG]: Secondary | ICD-10-CM | POA: Diagnosis not present

## 2022-06-06 DIAGNOSIS — R2681 Unsteadiness on feet: Secondary | ICD-10-CM | POA: Diagnosis not present

## 2022-06-06 DIAGNOSIS — I129 Hypertensive chronic kidney disease with stage 1 through stage 4 chronic kidney disease, or unspecified chronic kidney disease: Secondary | ICD-10-CM | POA: Diagnosis not present

## 2022-06-06 DIAGNOSIS — R000 Tachycardia, unspecified: Secondary | ICD-10-CM | POA: Diagnosis not present

## 2022-06-06 DIAGNOSIS — Z931 Gastrostomy status: Secondary | ICD-10-CM | POA: Diagnosis not present

## 2022-06-06 DIAGNOSIS — E871 Hypo-osmolality and hyponatremia: Secondary | ICD-10-CM | POA: Diagnosis not present

## 2022-06-06 DIAGNOSIS — J9811 Atelectasis: Secondary | ICD-10-CM | POA: Diagnosis not present

## 2022-06-06 DIAGNOSIS — I517 Cardiomegaly: Secondary | ICD-10-CM | POA: Diagnosis not present

## 2022-06-06 DIAGNOSIS — Z79899 Other long term (current) drug therapy: Secondary | ICD-10-CM | POA: Diagnosis not present

## 2022-06-06 DIAGNOSIS — R197 Diarrhea, unspecified: Secondary | ICD-10-CM | POA: Diagnosis not present

## 2022-06-06 DIAGNOSIS — E1122 Type 2 diabetes mellitus with diabetic chronic kidney disease: Secondary | ICD-10-CM | POA: Diagnosis not present

## 2022-06-06 DIAGNOSIS — F1010 Alcohol abuse, uncomplicated: Secondary | ICD-10-CM | POA: Diagnosis not present

## 2022-06-06 DIAGNOSIS — Z89421 Acquired absence of other right toe(s): Secondary | ICD-10-CM | POA: Diagnosis not present

## 2022-06-06 DIAGNOSIS — E870 Hyperosmolality and hypernatremia: Secondary | ICD-10-CM | POA: Diagnosis not present

## 2022-06-06 DIAGNOSIS — Z7401 Bed confinement status: Secondary | ICD-10-CM | POA: Diagnosis not present

## 2022-06-06 DIAGNOSIS — F419 Anxiety disorder, unspecified: Secondary | ICD-10-CM | POA: Diagnosis not present

## 2022-06-06 DIAGNOSIS — R7881 Bacteremia: Secondary | ICD-10-CM | POA: Diagnosis not present

## 2022-06-06 DIAGNOSIS — B957 Other staphylococcus as the cause of diseases classified elsewhere: Secondary | ICD-10-CM | POA: Diagnosis not present

## 2022-06-06 DIAGNOSIS — E46 Unspecified protein-calorie malnutrition: Secondary | ICD-10-CM | POA: Diagnosis not present

## 2022-06-06 DIAGNOSIS — N182 Chronic kidney disease, stage 2 (mild): Secondary | ICD-10-CM | POA: Diagnosis not present

## 2022-06-06 DIAGNOSIS — R627 Adult failure to thrive: Secondary | ICD-10-CM | POA: Diagnosis not present

## 2022-06-06 DIAGNOSIS — R4182 Altered mental status, unspecified: Secondary | ICD-10-CM | POA: Diagnosis not present

## 2022-06-06 DIAGNOSIS — N179 Acute kidney failure, unspecified: Secondary | ICD-10-CM | POA: Diagnosis not present

## 2022-06-06 DIAGNOSIS — A412 Sepsis due to unspecified staphylococcus: Secondary | ICD-10-CM | POA: Diagnosis not present

## 2022-06-06 DIAGNOSIS — Z85818 Personal history of malignant neoplasm of other sites of lip, oral cavity, and pharynx: Secondary | ICD-10-CM | POA: Diagnosis not present

## 2022-06-06 DIAGNOSIS — E43 Unspecified severe protein-calorie malnutrition: Secondary | ICD-10-CM | POA: Diagnosis not present

## 2022-06-06 DIAGNOSIS — M6281 Muscle weakness (generalized): Secondary | ICD-10-CM | POA: Diagnosis not present

## 2022-06-06 DIAGNOSIS — Z794 Long term (current) use of insulin: Secondary | ICD-10-CM | POA: Diagnosis not present

## 2022-06-06 DIAGNOSIS — R0902 Hypoxemia: Secondary | ICD-10-CM | POA: Diagnosis not present

## 2022-06-06 DIAGNOSIS — Z792 Long term (current) use of antibiotics: Secondary | ICD-10-CM | POA: Diagnosis not present

## 2022-06-06 DIAGNOSIS — R404 Transient alteration of awareness: Secondary | ICD-10-CM | POA: Diagnosis not present

## 2022-06-06 DIAGNOSIS — Z681 Body mass index (BMI) 19 or less, adult: Secondary | ICD-10-CM | POA: Diagnosis not present

## 2022-06-06 DIAGNOSIS — F1020 Alcohol dependence, uncomplicated: Secondary | ICD-10-CM | POA: Diagnosis not present

## 2022-06-06 DIAGNOSIS — Z960 Presence of urogenital implants: Secondary | ICD-10-CM | POA: Diagnosis not present

## 2022-06-22 DIAGNOSIS — F1010 Alcohol abuse, uncomplicated: Secondary | ICD-10-CM | POA: Diagnosis not present

## 2022-06-22 DIAGNOSIS — Z736 Limitation of activities due to disability: Secondary | ICD-10-CM | POA: Diagnosis not present

## 2022-06-22 DIAGNOSIS — R239 Unspecified skin changes: Secondary | ICD-10-CM | POA: Diagnosis not present

## 2022-06-22 DIAGNOSIS — E441 Mild protein-calorie malnutrition: Secondary | ICD-10-CM | POA: Diagnosis not present

## 2022-06-22 DIAGNOSIS — Z789 Other specified health status: Secondary | ICD-10-CM | POA: Diagnosis not present

## 2022-06-22 DIAGNOSIS — Z430 Encounter for attention to tracheostomy: Secondary | ICD-10-CM | POA: Diagnosis not present

## 2022-06-22 DIAGNOSIS — G4700 Insomnia, unspecified: Secondary | ICD-10-CM | POA: Diagnosis not present

## 2022-06-22 DIAGNOSIS — G894 Chronic pain syndrome: Secondary | ICD-10-CM | POA: Diagnosis not present

## 2022-06-22 DIAGNOSIS — Z931 Gastrostomy status: Secondary | ICD-10-CM | POA: Diagnosis not present

## 2022-06-22 DIAGNOSIS — B370 Candidal stomatitis: Secondary | ICD-10-CM | POA: Diagnosis not present

## 2022-06-22 DIAGNOSIS — M6281 Muscle weakness (generalized): Secondary | ICD-10-CM | POA: Diagnosis not present

## 2022-06-22 DIAGNOSIS — E860 Dehydration: Secondary | ICD-10-CM | POA: Diagnosis not present

## 2022-06-22 DIAGNOSIS — Z930 Tracheostomy status: Secondary | ICD-10-CM | POA: Diagnosis not present

## 2022-06-22 DIAGNOSIS — B957 Other staphylococcus as the cause of diseases classified elsewhere: Secondary | ICD-10-CM | POA: Diagnosis not present

## 2022-06-22 DIAGNOSIS — R1311 Dysphagia, oral phase: Secondary | ICD-10-CM | POA: Diagnosis not present

## 2022-06-22 DIAGNOSIS — Z7401 Bed confinement status: Secondary | ICD-10-CM | POA: Diagnosis not present

## 2022-06-22 DIAGNOSIS — E440 Moderate protein-calorie malnutrition: Secondary | ICD-10-CM | POA: Diagnosis not present

## 2022-06-22 DIAGNOSIS — E039 Hypothyroidism, unspecified: Secondary | ICD-10-CM | POA: Diagnosis not present

## 2022-06-22 DIAGNOSIS — R0981 Nasal congestion: Secondary | ICD-10-CM | POA: Diagnosis not present

## 2022-06-22 DIAGNOSIS — C01 Malignant neoplasm of base of tongue: Secondary | ICD-10-CM | POA: Diagnosis not present

## 2022-06-22 DIAGNOSIS — Z9889 Other specified postprocedural states: Secondary | ICD-10-CM | POA: Diagnosis not present

## 2022-06-22 DIAGNOSIS — F419 Anxiety disorder, unspecified: Secondary | ICD-10-CM | POA: Diagnosis not present

## 2022-06-22 DIAGNOSIS — E119 Type 2 diabetes mellitus without complications: Secondary | ICD-10-CM | POA: Diagnosis not present

## 2022-06-22 DIAGNOSIS — R7989 Other specified abnormal findings of blood chemistry: Secondary | ICD-10-CM | POA: Diagnosis not present

## 2022-06-22 DIAGNOSIS — I10 Essential (primary) hypertension: Secondary | ICD-10-CM | POA: Diagnosis not present

## 2022-06-22 DIAGNOSIS — E43 Unspecified severe protein-calorie malnutrition: Secondary | ICD-10-CM | POA: Diagnosis not present

## 2022-06-22 DIAGNOSIS — R531 Weakness: Secondary | ICD-10-CM | POA: Diagnosis not present

## 2022-06-22 DIAGNOSIS — R1310 Dysphagia, unspecified: Secondary | ICD-10-CM | POA: Diagnosis not present

## 2022-06-22 DIAGNOSIS — F1090 Alcohol use, unspecified, uncomplicated: Secondary | ICD-10-CM | POA: Diagnosis not present

## 2022-06-22 DIAGNOSIS — R2681 Unsteadiness on feet: Secondary | ICD-10-CM | POA: Diagnosis not present

## 2022-06-22 DIAGNOSIS — Z515 Encounter for palliative care: Secondary | ICD-10-CM | POA: Diagnosis not present

## 2022-06-22 DIAGNOSIS — F5101 Primary insomnia: Secondary | ICD-10-CM | POA: Diagnosis not present

## 2022-06-22 DIAGNOSIS — R52 Pain, unspecified: Secondary | ICD-10-CM | POA: Diagnosis not present

## 2022-06-22 DIAGNOSIS — G9340 Encephalopathy, unspecified: Secondary | ICD-10-CM | POA: Diagnosis not present

## 2022-06-22 DIAGNOSIS — F1020 Alcohol dependence, uncomplicated: Secondary | ICD-10-CM | POA: Diagnosis not present

## 2022-06-22 DIAGNOSIS — R7881 Bacteremia: Secondary | ICD-10-CM | POA: Diagnosis not present

## 2022-06-22 DIAGNOSIS — R5381 Other malaise: Secondary | ICD-10-CM | POA: Diagnosis not present

## 2022-06-22 DIAGNOSIS — E46 Unspecified protein-calorie malnutrition: Secondary | ICD-10-CM | POA: Diagnosis not present

## 2022-06-22 DIAGNOSIS — G9341 Metabolic encephalopathy: Secondary | ICD-10-CM | POA: Diagnosis not present

## 2022-06-22 DIAGNOSIS — E1122 Type 2 diabetes mellitus with diabetic chronic kidney disease: Secondary | ICD-10-CM | POA: Diagnosis not present

## 2022-06-22 DIAGNOSIS — L89153 Pressure ulcer of sacral region, stage 3: Secondary | ICD-10-CM | POA: Diagnosis not present

## 2022-06-22 DIAGNOSIS — N179 Acute kidney failure, unspecified: Secondary | ICD-10-CM | POA: Diagnosis not present

## 2022-06-22 DIAGNOSIS — R627 Adult failure to thrive: Secondary | ICD-10-CM | POA: Diagnosis not present

## 2022-06-22 DIAGNOSIS — E876 Hypokalemia: Secondary | ICD-10-CM | POA: Diagnosis not present

## 2022-06-23 DIAGNOSIS — Z931 Gastrostomy status: Secondary | ICD-10-CM | POA: Diagnosis not present

## 2022-06-23 DIAGNOSIS — R52 Pain, unspecified: Secondary | ICD-10-CM | POA: Diagnosis not present

## 2022-06-23 DIAGNOSIS — R627 Adult failure to thrive: Secondary | ICD-10-CM | POA: Diagnosis not present

## 2022-06-23 DIAGNOSIS — E46 Unspecified protein-calorie malnutrition: Secondary | ICD-10-CM | POA: Diagnosis not present

## 2022-06-23 DIAGNOSIS — Z9889 Other specified postprocedural states: Secondary | ICD-10-CM | POA: Diagnosis not present

## 2022-06-23 DIAGNOSIS — Z789 Other specified health status: Secondary | ICD-10-CM | POA: Diagnosis not present

## 2022-06-23 DIAGNOSIS — R5381 Other malaise: Secondary | ICD-10-CM | POA: Diagnosis not present

## 2022-06-23 DIAGNOSIS — E039 Hypothyroidism, unspecified: Secondary | ICD-10-CM | POA: Diagnosis not present

## 2022-06-24 DIAGNOSIS — Z736 Limitation of activities due to disability: Secondary | ICD-10-CM | POA: Diagnosis not present

## 2022-06-24 DIAGNOSIS — R239 Unspecified skin changes: Secondary | ICD-10-CM | POA: Diagnosis not present

## 2022-06-24 DIAGNOSIS — R627 Adult failure to thrive: Secondary | ICD-10-CM | POA: Diagnosis not present

## 2022-06-24 DIAGNOSIS — Z930 Tracheostomy status: Secondary | ICD-10-CM | POA: Diagnosis not present

## 2022-06-24 DIAGNOSIS — M6281 Muscle weakness (generalized): Secondary | ICD-10-CM | POA: Diagnosis not present

## 2022-06-24 DIAGNOSIS — L89153 Pressure ulcer of sacral region, stage 3: Secondary | ICD-10-CM | POA: Diagnosis not present

## 2022-06-24 DIAGNOSIS — E441 Mild protein-calorie malnutrition: Secondary | ICD-10-CM | POA: Diagnosis not present

## 2022-06-26 DIAGNOSIS — R627 Adult failure to thrive: Secondary | ICD-10-CM | POA: Diagnosis not present

## 2022-06-26 DIAGNOSIS — F1090 Alcohol use, unspecified, uncomplicated: Secondary | ICD-10-CM | POA: Diagnosis not present

## 2022-06-26 DIAGNOSIS — G894 Chronic pain syndrome: Secondary | ICD-10-CM | POA: Diagnosis not present

## 2022-06-26 DIAGNOSIS — F5101 Primary insomnia: Secondary | ICD-10-CM | POA: Diagnosis not present

## 2022-06-26 DIAGNOSIS — E43 Unspecified severe protein-calorie malnutrition: Secondary | ICD-10-CM | POA: Diagnosis not present

## 2022-06-26 DIAGNOSIS — E119 Type 2 diabetes mellitus without complications: Secondary | ICD-10-CM | POA: Diagnosis not present

## 2022-06-26 DIAGNOSIS — M6281 Muscle weakness (generalized): Secondary | ICD-10-CM | POA: Diagnosis not present

## 2022-06-26 DIAGNOSIS — R7881 Bacteremia: Secondary | ICD-10-CM | POA: Diagnosis not present

## 2022-06-29 DIAGNOSIS — M6281 Muscle weakness (generalized): Secondary | ICD-10-CM | POA: Diagnosis not present

## 2022-06-29 DIAGNOSIS — R7881 Bacteremia: Secondary | ICD-10-CM | POA: Diagnosis not present

## 2022-06-29 DIAGNOSIS — E119 Type 2 diabetes mellitus without complications: Secondary | ICD-10-CM | POA: Diagnosis not present

## 2022-06-29 DIAGNOSIS — G894 Chronic pain syndrome: Secondary | ICD-10-CM | POA: Diagnosis not present

## 2022-06-29 DIAGNOSIS — E43 Unspecified severe protein-calorie malnutrition: Secondary | ICD-10-CM | POA: Diagnosis not present

## 2022-06-29 DIAGNOSIS — F5101 Primary insomnia: Secondary | ICD-10-CM | POA: Diagnosis not present

## 2022-06-29 DIAGNOSIS — R627 Adult failure to thrive: Secondary | ICD-10-CM | POA: Diagnosis not present

## 2022-06-29 DIAGNOSIS — F1090 Alcohol use, unspecified, uncomplicated: Secondary | ICD-10-CM | POA: Diagnosis not present

## 2022-07-03 DIAGNOSIS — R239 Unspecified skin changes: Secondary | ICD-10-CM | POA: Diagnosis not present

## 2022-07-03 DIAGNOSIS — Z930 Tracheostomy status: Secondary | ICD-10-CM | POA: Diagnosis not present

## 2022-07-03 DIAGNOSIS — L89153 Pressure ulcer of sacral region, stage 3: Secondary | ICD-10-CM | POA: Diagnosis not present

## 2022-07-03 DIAGNOSIS — M6281 Muscle weakness (generalized): Secondary | ICD-10-CM | POA: Diagnosis not present

## 2022-07-03 DIAGNOSIS — Z736 Limitation of activities due to disability: Secondary | ICD-10-CM | POA: Diagnosis not present

## 2022-07-03 DIAGNOSIS — E441 Mild protein-calorie malnutrition: Secondary | ICD-10-CM | POA: Diagnosis not present

## 2022-07-03 DIAGNOSIS — R627 Adult failure to thrive: Secondary | ICD-10-CM | POA: Diagnosis not present

## 2022-07-06 DIAGNOSIS — B370 Candidal stomatitis: Secondary | ICD-10-CM | POA: Diagnosis not present

## 2022-07-06 DIAGNOSIS — C01 Malignant neoplasm of base of tongue: Secondary | ICD-10-CM | POA: Diagnosis not present

## 2022-07-06 DIAGNOSIS — R0981 Nasal congestion: Secondary | ICD-10-CM | POA: Diagnosis not present

## 2022-07-06 DIAGNOSIS — Z515 Encounter for palliative care: Secondary | ICD-10-CM | POA: Diagnosis not present

## 2022-07-06 DIAGNOSIS — E440 Moderate protein-calorie malnutrition: Secondary | ICD-10-CM | POA: Diagnosis not present

## 2022-07-06 DIAGNOSIS — R1310 Dysphagia, unspecified: Secondary | ICD-10-CM | POA: Diagnosis not present

## 2022-07-08 DIAGNOSIS — E441 Mild protein-calorie malnutrition: Secondary | ICD-10-CM | POA: Diagnosis not present

## 2022-07-08 DIAGNOSIS — M6281 Muscle weakness (generalized): Secondary | ICD-10-CM | POA: Diagnosis not present

## 2022-07-08 DIAGNOSIS — R239 Unspecified skin changes: Secondary | ICD-10-CM | POA: Diagnosis not present

## 2022-07-08 DIAGNOSIS — Z930 Tracheostomy status: Secondary | ICD-10-CM | POA: Diagnosis not present

## 2022-07-08 DIAGNOSIS — Z736 Limitation of activities due to disability: Secondary | ICD-10-CM | POA: Diagnosis not present

## 2022-07-08 DIAGNOSIS — R627 Adult failure to thrive: Secondary | ICD-10-CM | POA: Diagnosis not present

## 2022-07-08 DIAGNOSIS — L89153 Pressure ulcer of sacral region, stage 3: Secondary | ICD-10-CM | POA: Diagnosis not present

## 2022-07-20 DIAGNOSIS — R627 Adult failure to thrive: Secondary | ICD-10-CM | POA: Diagnosis not present

## 2022-07-20 DIAGNOSIS — F5104 Psychophysiologic insomnia: Secondary | ICD-10-CM | POA: Diagnosis not present

## 2022-07-20 DIAGNOSIS — E119 Type 2 diabetes mellitus without complications: Secondary | ICD-10-CM | POA: Diagnosis not present

## 2022-07-20 DIAGNOSIS — Z8619 Personal history of other infectious and parasitic diseases: Secondary | ICD-10-CM | POA: Diagnosis not present

## 2022-07-20 DIAGNOSIS — F1010 Alcohol abuse, uncomplicated: Secondary | ICD-10-CM | POA: Diagnosis not present

## 2022-07-20 DIAGNOSIS — I10 Essential (primary) hypertension: Secondary | ICD-10-CM | POA: Diagnosis not present

## 2022-07-20 DIAGNOSIS — M25561 Pain in right knee: Secondary | ICD-10-CM | POA: Diagnosis not present

## 2022-07-20 DIAGNOSIS — Z7984 Long term (current) use of oral hypoglycemic drugs: Secondary | ICD-10-CM | POA: Diagnosis not present

## 2022-07-20 DIAGNOSIS — E1142 Type 2 diabetes mellitus with diabetic polyneuropathy: Secondary | ICD-10-CM | POA: Diagnosis not present

## 2022-07-20 DIAGNOSIS — E039 Hypothyroidism, unspecified: Secondary | ICD-10-CM | POA: Diagnosis not present

## 2022-07-20 DIAGNOSIS — D509 Iron deficiency anemia, unspecified: Secondary | ICD-10-CM | POA: Diagnosis not present

## 2022-07-20 DIAGNOSIS — M25562 Pain in left knee: Secondary | ICD-10-CM | POA: Diagnosis not present

## 2022-07-20 DIAGNOSIS — F418 Other specified anxiety disorders: Secondary | ICD-10-CM | POA: Diagnosis not present

## 2022-07-20 DIAGNOSIS — K5909 Other constipation: Secondary | ICD-10-CM | POA: Diagnosis not present

## 2022-07-22 ENCOUNTER — Inpatient Hospital Stay (HOSPITAL_COMMUNITY): Payer: Medicare (Managed Care)

## 2022-07-22 ENCOUNTER — Emergency Department (HOSPITAL_COMMUNITY): Payer: Medicare (Managed Care)

## 2022-07-22 ENCOUNTER — Encounter (HOSPITAL_COMMUNITY)
Admission: EM | Disposition: A | Discharge status: Home / Self Care | Payer: Self-pay | Source: Home / Self Care | Attending: Emergency Medicine

## 2022-07-22 ENCOUNTER — Other Ambulatory Visit: Payer: Self-pay

## 2022-07-22 ENCOUNTER — Inpatient Hospital Stay (HOSPITAL_BASED_OUTPATIENT_CLINIC_OR_DEPARTMENT_OTHER): Payer: Medicare (Managed Care) | Admitting: Registered Nurse

## 2022-07-22 ENCOUNTER — Encounter (HOSPITAL_COMMUNITY): Payer: Self-pay | Admitting: Anesthesiology

## 2022-07-22 ENCOUNTER — Inpatient Hospital Stay (HOSPITAL_COMMUNITY): Payer: Medicare (Managed Care) | Admitting: Registered Nurse

## 2022-07-22 ENCOUNTER — Encounter (HOSPITAL_COMMUNITY): Payer: Self-pay

## 2022-07-22 ENCOUNTER — Observation Stay (HOSPITAL_COMMUNITY)
Admission: EM | Admit: 2022-07-22 | Discharge: 2022-07-24 | Disposition: A | Discharge status: Home / Self Care | Payer: Medicare (Managed Care) | Attending: Internal Medicine | Admitting: Internal Medicine

## 2022-07-22 DIAGNOSIS — Y828 Other medical devices associated with adverse incidents: Secondary | ICD-10-CM | POA: Diagnosis not present

## 2022-07-22 DIAGNOSIS — S0990XA Unspecified injury of head, initial encounter: Secondary | ICD-10-CM | POA: Diagnosis not present

## 2022-07-22 DIAGNOSIS — Z471 Aftercare following joint replacement surgery: Secondary | ICD-10-CM | POA: Diagnosis not present

## 2022-07-22 DIAGNOSIS — F1020 Alcohol dependence, uncomplicated: Secondary | ICD-10-CM | POA: Diagnosis present

## 2022-07-22 DIAGNOSIS — R9389 Abnormal findings on diagnostic imaging of other specified body structures: Secondary | ICD-10-CM | POA: Diagnosis not present

## 2022-07-22 DIAGNOSIS — T84020A Dislocation of internal right hip prosthesis, initial encounter: Secondary | ICD-10-CM

## 2022-07-22 DIAGNOSIS — I679 Cerebrovascular disease, unspecified: Secondary | ICD-10-CM

## 2022-07-22 DIAGNOSIS — Z96653 Presence of artificial knee joint, bilateral: Secondary | ICD-10-CM | POA: Insufficient documentation

## 2022-07-22 DIAGNOSIS — Z85828 Personal history of other malignant neoplasm of skin: Secondary | ICD-10-CM | POA: Diagnosis not present

## 2022-07-22 DIAGNOSIS — Z7984 Long term (current) use of oral hypoglycemic drugs: Secondary | ICD-10-CM | POA: Diagnosis not present

## 2022-07-22 DIAGNOSIS — Z8673 Personal history of transient ischemic attack (TIA), and cerebral infarction without residual deficits: Secondary | ICD-10-CM | POA: Diagnosis not present

## 2022-07-22 DIAGNOSIS — R42 Dizziness and giddiness: Secondary | ICD-10-CM | POA: Diagnosis not present

## 2022-07-22 DIAGNOSIS — F411 Generalized anxiety disorder: Secondary | ICD-10-CM | POA: Diagnosis present

## 2022-07-22 DIAGNOSIS — Z96641 Presence of right artificial hip joint: Secondary | ICD-10-CM | POA: Diagnosis not present

## 2022-07-22 DIAGNOSIS — I10 Essential (primary) hypertension: Secondary | ICD-10-CM

## 2022-07-22 DIAGNOSIS — Z85818 Personal history of malignant neoplasm of other sites of lip, oral cavity, and pharynx: Secondary | ICD-10-CM | POA: Insufficient documentation

## 2022-07-22 DIAGNOSIS — S73004A Unspecified dislocation of right hip, initial encounter: Secondary | ICD-10-CM | POA: Diagnosis present

## 2022-07-22 DIAGNOSIS — Z79899 Other long term (current) drug therapy: Secondary | ICD-10-CM | POA: Insufficient documentation

## 2022-07-22 DIAGNOSIS — S73004D Unspecified dislocation of right hip, subsequent encounter: Secondary | ICD-10-CM

## 2022-07-22 DIAGNOSIS — Z85810 Personal history of malignant neoplasm of tongue: Secondary | ICD-10-CM | POA: Diagnosis not present

## 2022-07-22 DIAGNOSIS — I959 Hypotension, unspecified: Secondary | ICD-10-CM | POA: Diagnosis not present

## 2022-07-22 DIAGNOSIS — E119 Type 2 diabetes mellitus without complications: Secondary | ICD-10-CM

## 2022-07-22 DIAGNOSIS — Z87891 Personal history of nicotine dependence: Secondary | ICD-10-CM

## 2022-07-22 DIAGNOSIS — C109 Malignant neoplasm of oropharynx, unspecified: Secondary | ICD-10-CM

## 2022-07-22 DIAGNOSIS — R519 Headache, unspecified: Secondary | ICD-10-CM | POA: Diagnosis not present

## 2022-07-22 DIAGNOSIS — R918 Other nonspecific abnormal finding of lung field: Secondary | ICD-10-CM | POA: Diagnosis not present

## 2022-07-22 DIAGNOSIS — R000 Tachycardia, unspecified: Secondary | ICD-10-CM | POA: Diagnosis not present

## 2022-07-22 DIAGNOSIS — I6782 Cerebral ischemia: Secondary | ICD-10-CM | POA: Diagnosis not present

## 2022-07-22 DIAGNOSIS — C760 Malignant neoplasm of head, face and neck: Secondary | ICD-10-CM | POA: Diagnosis not present

## 2022-07-22 DIAGNOSIS — D649 Anemia, unspecified: Secondary | ICD-10-CM | POA: Diagnosis present

## 2022-07-22 DIAGNOSIS — S73006A Unspecified dislocation of unspecified hip, initial encounter: Secondary | ICD-10-CM | POA: Diagnosis present

## 2022-07-22 DIAGNOSIS — T84020D Dislocation of internal right hip prosthesis, subsequent encounter: Secondary | ICD-10-CM | POA: Diagnosis not present

## 2022-07-22 HISTORY — PX: HIP CLOSED REDUCTION: SHX983

## 2022-07-22 LAB — CBC WITH DIFFERENTIAL/PLATELET
Abs Immature Granulocytes: 0.05 10*3/uL (ref 0.00–0.07)
Basophils Absolute: 0.1 10*3/uL (ref 0.0–0.1)
Basophils Relative: 1 %
Eosinophils Absolute: 0 10*3/uL (ref 0.0–0.5)
Eosinophils Relative: 0 %
HCT: 33.7 % — ABNORMAL LOW (ref 39.0–52.0)
Hemoglobin: 10.8 g/dL — ABNORMAL LOW (ref 13.0–17.0)
Immature Granulocytes: 1 %
Lymphocytes Relative: 4 %
Lymphs Abs: 0.4 10*3/uL — ABNORMAL LOW (ref 0.7–4.0)
MCH: 28.1 pg (ref 26.0–34.0)
MCHC: 32 g/dL (ref 30.0–36.0)
MCV: 87.8 fL (ref 80.0–100.0)
Monocytes Absolute: 0.6 10*3/uL (ref 0.1–1.0)
Monocytes Relative: 6 %
Neutro Abs: 9.2 10*3/uL — ABNORMAL HIGH (ref 1.7–7.7)
Neutrophils Relative %: 88 %
Platelets: 229 10*3/uL (ref 150–400)
RBC: 3.84 MIL/uL — ABNORMAL LOW (ref 4.22–5.81)
RDW: 13.5 % (ref 11.5–15.5)
WBC: 10.3 10*3/uL (ref 4.0–10.5)
nRBC: 0 % (ref 0.0–0.2)

## 2022-07-22 LAB — COMPREHENSIVE METABOLIC PANEL
ALT: 22 U/L (ref 0–44)
AST: 36 U/L (ref 15–41)
Albumin: 3.7 g/dL (ref 3.5–5.0)
Alkaline Phosphatase: 75 U/L (ref 38–126)
Anion gap: 11 (ref 5–15)
BUN: 22 mg/dL — ABNORMAL HIGH (ref 6–20)
CO2: 23 mmol/L (ref 22–32)
Calcium: 8.7 mg/dL — ABNORMAL LOW (ref 8.9–10.3)
Chloride: 99 mmol/L (ref 98–111)
Creatinine, Ser: 1.15 mg/dL (ref 0.61–1.24)
GFR, Estimated: >60 mL/min (ref >60)
Glucose, Bld: 97 mg/dL (ref 70–99)
Potassium: 4.1 mmol/L (ref 3.5–5.1)
Sodium: 133 mmol/L — ABNORMAL LOW (ref 135–145)
Total Bilirubin: 1 mg/dL (ref 0.3–1.2)
Total Protein: 7 g/dL (ref 6.5–8.1)

## 2022-07-22 LAB — URINALYSIS, ROUTINE W REFLEX MICROSCOPIC
Bilirubin Urine: NEGATIVE
Glucose, UA: NEGATIVE mg/dL
Hgb urine dipstick: NEGATIVE
Ketones, ur: 5 mg/dL — AB
Leukocytes,Ua: NEGATIVE
Nitrite: NEGATIVE
Protein, ur: NEGATIVE mg/dL
Specific Gravity, Urine: 1.012 (ref 1.005–1.030)
pH: 5 (ref 5.0–8.0)

## 2022-07-22 LAB — GLUCOSE, CAPILLARY
Glucose-Capillary: 68 mg/dL — ABNORMAL LOW (ref 70–99)
Glucose-Capillary: 131 mg/dL — ABNORMAL HIGH (ref 70–99)
Glucose-Capillary: 132 mg/dL — ABNORMAL HIGH (ref 70–99)

## 2022-07-22 LAB — HEMOGLOBIN A1C
Hgb A1c MFr Bld: 5 % (ref 4.8–5.6)
Mean Plasma Glucose: 96.8 mg/dL

## 2022-07-22 LAB — SURGICAL PCR SCREEN
MRSA, PCR: POSITIVE — AB
Staphylococcus aureus: POSITIVE — AB

## 2022-07-22 SURGERY — CLOSED REDUCTION, HIP
Anesthesia: Choice | Site: Hip | Laterality: Right

## 2022-07-22 SURGERY — CLOSED REDUCTION, HIP
Anesthesia: General | Site: Hip | Laterality: Right

## 2022-07-22 MED ORDER — SODIUM CHLORIDE 0.9 % IV SOLN: INTRAVENOUS | Status: DC

## 2022-07-22 MED ORDER — PROPOFOL 10 MG/ML IV BOLUS
INTRAVENOUS | Status: AC
  Filled 2022-07-22: qty 20

## 2022-07-22 MED ORDER — LACTATED RINGERS IV SOLN: INTRAVENOUS | Status: DC | PRN

## 2022-07-22 MED ORDER — PANTOPRAZOLE SODIUM 40 MG PO TBEC
40.0000 mg | DELAYED_RELEASE_TABLET | Freq: Two times a day (BID) | ORAL | Status: DC
  Administered 2022-07-23 – 2022-07-24 (×3): 40 mg via ORAL
  Filled 2022-07-22 (×3): qty 1

## 2022-07-22 MED ORDER — GLUCERNA SHAKE PO LIQD
237.0000 mL | Freq: Three times a day (TID) | ORAL | Status: DC
  Administered 2022-07-23 – 2022-07-24 (×4): 237 mL via ORAL
  Filled 2022-07-22 (×3): qty 237

## 2022-07-22 MED ORDER — IBUPROFEN 200 MG PO TABS: 200.0000 mg | ORAL_TABLET | Freq: Four times a day (QID) | ORAL | Status: DC | PRN

## 2022-07-22 MED ORDER — MIRTAZAPINE 15 MG PO TABS: 30.0000 mg | ORAL_TABLET | Freq: Every day | ORAL | Status: DC

## 2022-07-22 MED ORDER — METHOCARBAMOL 500 MG PO TABS
500.0000 mg | ORAL_TABLET | Freq: Two times a day (BID) | ORAL | Status: DC
  Administered 2022-07-23 – 2022-07-24 (×3): 500 mg via ORAL
  Filled 2022-07-22 (×3): qty 1

## 2022-07-22 MED ORDER — INSULIN ASPART 100 UNIT/ML IJ SOLN
0.0000 [IU] | Freq: Three times a day (TID) | INTRAMUSCULAR | Status: DC
  Administered 2022-07-23: 2 [IU] via SUBCUTANEOUS
  Administered 2022-07-24: 1 [IU] via SUBCUTANEOUS

## 2022-07-22 MED ORDER — HEPARIN SODIUM (PORCINE) 5000 UNIT/ML IJ SOLN
5000.0000 [IU] | Freq: Three times a day (TID) | INTRAMUSCULAR | Status: DC
  Administered 2022-07-23 – 2022-07-24 (×4): 5000 [IU] via SUBCUTANEOUS
  Filled 2022-07-22 (×4): qty 1

## 2022-07-22 MED ORDER — PROPOFOL 10 MG/ML IV BOLUS
INTRAVENOUS | Status: DC | PRN
  Administered 2022-07-22: 30 mg via INTRAVENOUS

## 2022-07-22 MED ORDER — ROCURONIUM BROMIDE 10 MG/ML (PF) SYRINGE
PREFILLED_SYRINGE | INTRAVENOUS | Status: DC | PRN
  Administered 2022-07-22: 20 mg via INTRAVENOUS

## 2022-07-22 MED ORDER — PROPOFOL 10 MG/ML IV BOLUS
0.5000 mg/kg | Freq: Once | INTRAVENOUS | Status: AC
  Administered 2022-07-22: 33.5 mg via INTRAVENOUS
  Filled 2022-07-22: qty 20

## 2022-07-22 MED ORDER — GABAPENTIN 300 MG PO CAPS
300.0000 mg | ORAL_CAPSULE | Freq: Three times a day (TID) | ORAL | Status: DC
  Administered 2022-07-23 (×2): 300 mg via ORAL
  Filled 2022-07-22 (×2): qty 1

## 2022-07-22 MED ORDER — FENTANYL CITRATE (PF) 250 MCG/5ML IJ SOLN
INTRAMUSCULAR | Status: DC | PRN
  Administered 2022-07-22: 50 ug via INTRAVENOUS

## 2022-07-22 MED ORDER — FENTANYL CITRATE (PF) 100 MCG/2ML IJ SOLN
25.0000 ug | INTRAMUSCULAR | Status: DC | PRN
  Administered 2022-07-22: 50 ug via INTRAVENOUS

## 2022-07-22 MED ORDER — SUCCINYLCHOLINE CHLORIDE 200 MG/10ML IV SOSY
PREFILLED_SYRINGE | INTRAVENOUS | Status: DC | PRN
  Administered 2022-07-22: 100 mg via INTRAVENOUS

## 2022-07-22 MED ORDER — FENTANYL CITRATE PF 50 MCG/ML IJ SOSY
50.0000 ug | PREFILLED_SYRINGE | Freq: Once | INTRAMUSCULAR | Status: AC
  Administered 2022-07-22: 50 ug via INTRAVENOUS
  Filled 2022-07-22: qty 1

## 2022-07-22 MED ORDER — ONDANSETRON HCL 4 MG/2ML IJ SOLN: 4.0000 mg | Freq: Once | INTRAMUSCULAR | Status: DC | PRN

## 2022-07-22 MED ORDER — MAGNESIUM OXIDE -MG SUPPLEMENT 400 (240 MG) MG PO TABS
400.0000 mg | ORAL_TABLET | Freq: Two times a day (BID) | ORAL | Status: DC
  Administered 2022-07-23 – 2022-07-24 (×3): 400 mg via ORAL
  Filled 2022-07-22 (×8): qty 1

## 2022-07-22 MED ORDER — HYDROXYZINE PAMOATE 25 MG PO CAPS
25.0000 mg | ORAL_CAPSULE | Freq: Three times a day (TID) | ORAL | Status: DC
  Filled 2022-07-22 (×6): qty 1

## 2022-07-22 MED ORDER — SODIUM CHLORIDE 0.9 % IV SOLN: 250.0000 mL | INTRAVENOUS | Status: DC | PRN

## 2022-07-22 MED ORDER — QUETIAPINE FUMARATE 100 MG PO TABS
300.0000 mg | ORAL_TABLET | Freq: Every day | ORAL | Status: DC
  Administered 2022-07-23: 300 mg via ORAL
  Filled 2022-07-22: qty 3

## 2022-07-22 MED ORDER — ONDANSETRON HCL 4 MG/2ML IJ SOLN
4.0000 mg | Freq: Once | INTRAMUSCULAR | Status: AC
  Administered 2022-07-22: 4 mg via INTRAVENOUS
  Filled 2022-07-22: qty 2

## 2022-07-22 MED ORDER — SODIUM CHLORIDE 0.9% FLUSH
3.0000 mL | Freq: Two times a day (BID) | INTRAVENOUS | Status: DC
  Administered 2022-07-23: 3 mL via INTRAVENOUS

## 2022-07-22 MED ORDER — DEXTROSE 50 % IV SOLN
INTRAVENOUS | Status: AC
  Filled 2022-07-22: qty 50

## 2022-07-22 MED ORDER — SENNOSIDES-DOCUSATE SODIUM 8.6-50 MG PO TABS
1.0000 | ORAL_TABLET | Freq: Every day | ORAL | Status: DC
  Administered 2022-07-23 – 2022-07-24 (×2): 1 via ORAL
  Filled 2022-07-22 (×5): qty 1

## 2022-07-22 MED ORDER — OXYCODONE-ACETAMINOPHEN 5-325 MG PO TABS
1.0000 | ORAL_TABLET | ORAL | Status: DC | PRN
  Administered 2022-07-23 – 2022-07-24 (×6): 1 via ORAL
  Filled 2022-07-22 (×6): qty 1

## 2022-07-22 MED ORDER — FENTANYL CITRATE (PF) 250 MCG/5ML IJ SOLN
INTRAMUSCULAR | Status: AC
  Filled 2022-07-22: qty 5

## 2022-07-22 MED ORDER — SUCRALFATE 1 GM/10ML PO SUSP
1.0000 g | Freq: Three times a day (TID) | ORAL | Status: DC
  Administered 2022-07-23 – 2022-07-24 (×6): 1 g
  Filled 2022-07-22 (×9): qty 10

## 2022-07-22 MED ORDER — CHLORHEXIDINE GLUCONATE CLOTH 2 % EX PADS
6.0000 | MEDICATED_PAD | Freq: Every day | CUTANEOUS | Status: DC
  Administered 2022-07-23 – 2022-07-24 (×2): 6 via TOPICAL

## 2022-07-22 MED ORDER — LIDOCAINE VISCOUS HCL 2 % MT SOLN
15.0000 mL | Freq: Every day | OROMUCOSAL | Status: DC
  Administered 2022-07-24: 15 mL via OROMUCOSAL
  Filled 2022-07-22 (×2): qty 15

## 2022-07-22 MED ORDER — PHENYLEPHRINE 80 MCG/ML (10ML) SYRINGE FOR IV PUSH (FOR BLOOD PRESSURE SUPPORT)
PREFILLED_SYRINGE | INTRAVENOUS | Status: DC | PRN
  Administered 2022-07-22 (×2): 80 ug via INTRAVENOUS

## 2022-07-22 MED ORDER — POTASSIUM CHLORIDE CRYS ER 20 MEQ PO TBCR
20.0000 meq | EXTENDED_RELEASE_TABLET | Freq: Every day | ORAL | Status: DC
  Administered 2022-07-23 – 2022-07-24 (×2): 20 meq via ORAL
  Filled 2022-07-22 (×2): qty 1

## 2022-07-22 MED ORDER — NYSTATIN 100000 UNIT/ML MT SUSP
5.0000 mL | Freq: Four times a day (QID) | OROMUCOSAL | Status: DC
  Administered 2022-07-23 – 2022-07-24 (×5): 500000 [IU] via ORAL
  Filled 2022-07-22 (×5): qty 5

## 2022-07-22 MED ORDER — MORPHINE SULFATE (PF) 4 MG/ML IV SOLN
4.0000 mg | Freq: Once | INTRAVENOUS | Status: AC
  Administered 2022-07-22: 4 mg via INTRAVENOUS
  Filled 2022-07-22: qty 1

## 2022-07-22 MED ORDER — DEXTROSE 50 % IV SOLN
12.5000 g | Freq: Once | INTRAVENOUS | Status: AC
  Administered 2022-07-22: 12.5 g via INTRAVENOUS

## 2022-07-22 MED ORDER — CHLORHEXIDINE GLUCONATE CLOTH 2 % EX PADS: 6.0000 | MEDICATED_PAD | Freq: Every day | CUTANEOUS | Status: DC

## 2022-07-22 MED ORDER — SODIUM CHLORIDE 0.9% FLUSH: 3.0000 mL | INTRAVENOUS | Status: DC | PRN

## 2022-07-22 MED ORDER — PROCHLORPERAZINE MALEATE 10 MG PO TABS
10.0000 mg | ORAL_TABLET | Freq: Every day | ORAL | Status: DC
  Filled 2022-07-22 (×2): qty 1

## 2022-07-22 MED ORDER — FENTANYL CITRATE (PF) 100 MCG/2ML IJ SOLN
INTRAMUSCULAR | Status: AC
  Filled 2022-07-22: qty 2

## 2022-07-22 MED ORDER — MUPIROCIN 2 % EX OINT
1.0000 | TOPICAL_OINTMENT | Freq: Two times a day (BID) | CUTANEOUS | Status: DC
  Administered 2022-07-22 – 2022-07-24 (×4): 1 via NASAL
  Filled 2022-07-22 (×2): qty 22

## 2022-07-22 MED ORDER — PROPOFOL 10 MG/ML IV BOLUS
INTRAVENOUS | Status: AC | PRN
  Administered 2022-07-22: 30 mg via INTRAVENOUS
  Administered 2022-07-22: 20 mg via INTRAVENOUS

## 2022-07-22 MED ORDER — MIDAZOLAM HCL 2 MG/2ML IJ SOLN
INTRAMUSCULAR | Status: AC
  Filled 2022-07-22: qty 2

## 2022-07-22 MED ORDER — METOPROLOL TARTRATE 12.5 MG HALF TABLET
12.5000 mg | ORAL_TABLET | Freq: Two times a day (BID) | ORAL | Status: DC
  Administered 2022-07-23: 12.5 mg via ORAL
  Filled 2022-07-22: qty 1

## 2022-07-22 MED ORDER — ACETAMINOPHEN 500 MG PO TABS: 1000.0000 mg | ORAL_TABLET | Freq: Once | ORAL | Status: DC

## 2022-07-22 MED ORDER — HYDROXYZINE HCL 25 MG PO TABS
25.0000 mg | ORAL_TABLET | Freq: Three times a day (TID) | ORAL | Status: DC
  Administered 2022-07-23 (×2): 25 mg via ORAL
  Filled 2022-07-22 (×2): qty 1

## 2022-07-22 MED ORDER — SUGAMMADEX SODIUM 200 MG/2ML IV SOLN
INTRAVENOUS | Status: DC | PRN
  Administered 2022-07-22 (×2): 100 mg via INTRAVENOUS

## 2022-07-22 MED ORDER — LIDOCAINE 2% (20 MG/ML) 5 ML SYRINGE
INTRAMUSCULAR | Status: DC | PRN
  Administered 2022-07-22: 60 mg via INTRAVENOUS

## 2022-07-22 SURGICAL SUPPLY — 1 items: IMMOBILIZER KNEE 22 UNIV (SOFTGOODS) IMPLANT

## 2022-07-22 NOTE — Transfer of Care (Signed)
Immediate Anesthesia Transfer of Care Note  Patient: [name]  Procedure(s) Performed: CLOSED REDUCTION HIP (Right: Hip)  Patient Location: PACU  Anesthesia Type:General  Level of Consciousness: awake, alert , and oriented  Airway & Oxygen Therapy: Patient Spontanous Breathing  Post-op Assessment: Report given to RN and Post -op Vital signs reviewed and stable  Post vital signs: Reviewed and stable  Last Vitals:  Vitals Value Taken Time  BP 130/106 07/22/22 2158  Temp    Pulse 102 07/22/22 2200  Resp 10 07/22/22 2200  SpO2 96 % 07/22/22 2200  Vitals shown include unvalidated device data.  Last Pain:  Vitals:   07/22/22 1956  TempSrc: Oral  PainSc:          Complications: No notable events documented.

## 2022-07-22 NOTE — Anesthesia Procedure Notes (Signed)
Procedure Name: Intubation Date/Time: 07/22/2022 9:22 PM  Performed by: Laruth Bouchard., CRNAPre-anesthesia Checklist: Patient identified, Emergency Drugs available, Suction available, Patient being monitored and Timeout performed Patient Re-evaluated:Patient Re-evaluated prior to induction Oxygen Delivery Method: Circle system utilized Preoxygenation: Pre-oxygenation with 100% oxygen Induction Type: IV induction and Rapid sequence Tube type: Reinforced Tube size: 7.0 mm Number of attempts: 1 Airway Equipment and Method: Tracheostomy Placement Confirmation: positive ETCO2 and breath sounds checked- equal and bilateral Tube secured with: Tape Dental Injury: Teeth and Oropharynx as per pre-operative assessment  Comments: Intubated patient through pre-existing tracheostomy with 7.0 reinforced ETT

## 2022-07-22 NOTE — Assessment & Plan Note (Signed)
Patient reports he stopped drinking several years ago and denies any alcohol intake.

## 2022-07-22 NOTE — Assessment & Plan Note (Signed)
He has chronic anemia 2/2 multiple medical and nutritional issues with his current Hgb at his baseline

## 2022-07-22 NOTE — Op Note (Signed)
07/22/2022  9:54 PM  PATIENT:  [name]    PRE-OPERATIVE DIAGNOSIS: Right prosthetic hip dislocation  POST-OPERATIVE DIAGNOSIS:  Same  PROCEDURE: Closed reduction under anesthesia right prosthetic hip dislocation  SURGEON:  Eulas Post, MD  PHYSICIAN ASSISTANT: Janine Ores, PA-C, present and scrubbed throughout the case, critical for completion in a timely fashion, and for retraction, instrumentation, and closure.  ANESTHESIA:   General  PREOPERATIVE INDICATIONS:  [name] is a  57 y.o. male with a diagnosis of right hip dislocation who failed conservative measures and elected for surgical management.  He apparently had his hip done maybe 20 years ago at [facility].  He has had at least 1 prior dislocation.  The risks benefits and alternatives were discussed with the patient preoperatively including but not limited to the risks of infection, bleeding, nerve injury, recurrent dislocation, fracture, cardiopulmonary complications, the need for revision surgery, among others, and the patient was willing to proceed.  ESTIMATED BLOOD LOSS: None  OPERATIVE IMPLANTS: None  OPERATIVE FINDINGS: Posterior superior prosthetic hip dislocation  OPERATIVE PROCEDURE: Patient was brought to the operating room and placed in supine position.  General anesthesia was administered.  Timeout performed.  The right upper lower extremity was manipulated.  First I tried it supine, I could not get it in, then I tried it lateral, still could not get it in.  And then I went supine again, and after multiple exhaustive attempts that it was finally reduced.  This was quite challenging.  Ultimately C-arm was used then confirmed the appropriate position of the reduction.  The patient tolerated the procedure well, was placed in a knee immobilizer, and then returned to the PACU in stable and satisfactory condition.  There were no complications and he tolerated the procedure well.  Will get some postop x-rays to make  sure no additional damage was done.  He can be weightbearing as tolerated, work with therapy, can be discharged once stable safe discharge plan is in place.  DVT prophylaxis per primary team.

## 2022-07-22 NOTE — Assessment & Plan Note (Signed)
Patient appears to be calm and without agitation.  Plan Continue home medications

## 2022-07-22 NOTE — Progress Notes (Signed)
I was asked to evaluate this patient who presented this morning with a dislocated hip.  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  He is currently nonverbal, and communicates by writing on a dry erase board.  He has a tracheostomy and does not have any appliances installed in the trach.  He is fed enterally.  We are asked to provide general anesthesia with muscle relaxation.  I am concerned about the safety of performing this anesthetic at [facility] for the following reasons:  -We do not have a cuffed trach device, which would be ideal for positive pressure ventilation. -In an emergency situation, we would insert an endotracheal tube through the stoma, but on evaluation, the trachea is narrow with a very sharp turn.  I would want to use a flexible reinforced ETT, also which we do not have here. -Finally, we do not have any ENT available, or any intensivist available.  The patient would be admitted, and I'd be concerned about keeping him here without the capability to effectively mechanically ventilate him.  While we would very likely be able to get him through the procedure here, our ability to manage any airway complications is severely limited.  Therefore, as minor as this surgery is, I would recommend transfer to a higher acuity OR.

## 2022-07-22 NOTE — ED Triage Notes (Signed)
Pt BIB [facility] from home C/O fall resulting in R leg pain and pain behind R ear. Pt reports fall was d/t dizziness. Hx throat cancer, communicates with white board.

## 2022-07-22 NOTE — ED Provider Notes (Signed)
[place] EMERGENCY DEPARTMENT AT [facility] Provider Note   CSN: [id number] Arrival date & time: 07/22/22  0831     History  Chief Complaint  Patient presents with   [name] is a 57 y.o. male.He has PMH of throat cancer, and communicates using a white board. He was BIB EMS today after a fall at home. Fall was due to tripping, states sometimes his right knee gives out and he falls.  He was feeling slightly dizzy but he states this is something that is normal for him.  No change in today from his baseline symptoms, denies syncope or LOC, c/o R hip pain and right sided head pain. He is not on blood thinner, denies any other pain, no N/V. No numbness or tingling to the right leg.  Fall       Home Medications Prior to Admission medications   Medication Sig Start Date End Date Taking? Authorizing Provider  acetaminophen (TYLENOL) 325 MG tablet Take 1-2 tablets (325-650 mg total) by mouth every 6 (six) hours as needed for mild pain (pain score 1-3 or temp > 100.5). 03/24/19   Persons, [place], PA  feeding supplement, GLUCERNA SHAKE, (GLUCERNA SHAKE) LIQD Take 237 mLs by mouth 3 (three) times daily between meals. 03/24/19   Persons, [place], PA  fluconazole (DIFLUCAN) 200 MG tablet Take 200 mg by mouth daily. 04/18/21   [provider]  gabapentin (NEURONTIN) 300 MG capsule Take 1 capsule (300 mg total) by mouth 3 (three) times daily. 03/24/19   Persons, [place], PA  hydrOXYzine (VISTARIL) 25 MG capsule Take 25 mg by mouth 3 (three) times daily. 04/17/21   [provider]  ibuprofen (ADVIL) 200 MG tablet Take 200 mg by mouth every 6 (six) hours as needed.    [provider]  lidocaine (XYLOCAINE) 2 % solution Use as directed in the mouth or throat daily at 6 (six) AM. 03/06/21   [provider]  magnesium oxide (MAG-OX) 400 MG tablet Take 1 tablet by mouth 2 (two) times daily. 03/17/21   [provider]  metFORMIN  (GLUCOPHAGE) 500 MG tablet Take 2 tablets (1,000 mg total) by mouth 2 (two) times daily with a meal. Patient taking differently: Take 500 mg by mouth 2 (two) times daily with a meal. 06/17/18   Darlin Drop, DO  methocarbamol (ROBAXIN) 500 MG tablet Take 1 tablet (500 mg total) by mouth 2 (two) times daily. 12/09/21   Schutt, Edsel Petrin, PA-C  metoprolol tartrate (LOPRESSOR) 25 MG tablet Take 12.5 mg by mouth 2 (two) times daily. 04/17/21   [provider]  mirtazapine (REMERON) 30 MG tablet Take 30 mg by mouth at bedtime. 04/17/21   [provider]  nystatin (MYCOSTATIN) 100000 UNIT/ML suspension Take 5 mLs by mouth 4 (four) times daily. 04/17/21   [provider]  oxyCODONE-acetaminophen (PERCOCET/ROXICET) 5-325 MG tablet Take 1 tablet by mouth every 4 (four) hours as needed. 01/15/22   Triplett, Tammy, PA-C  pantoprazole (PROTONIX) 40 MG tablet Take 1 tablet (40 mg total) by mouth 2 (two) times daily. 06/10/21   Carlan, Chelsea L, NP  potassium chloride SA (KLOR-CON M) 20 MEQ tablet Take 20 mEq by mouth daily. 03/13/21   [provider]  prochlorperazine (COMPAZINE) 10 MG tablet Take 10 mg by mouth daily at 6 (six) AM. 03/21/21   [provider]  QUEtiapine (SEROQUEL) 300 MG tablet Take 300 mg by mouth at bedtime. 02/21/21  [provider]  sennosides-docusate sodium (SENOKOT-S) 8.6-50 MG tablet Take 1 tablet by mouth at bedtime.    [provider]  sucralfate (CARAFATE) 1 GM/10ML suspension Take by mouth 4 (four) times daily. 04/18/21   [provider]      Allergies    Benadryl [diphenhydramine hcl] and Trazodone and nefazodone    Review of Systems   Review of Systems  Physical Exam Updated Vital Signs BP (!) 146/85   Pulse 92   Temp 98.7 F (37.1 C) (Oral)   Resp 16   Wt 66.9 kg   SpO2 98%   BMI 18.44 kg/m  Physical Exam Vitals and nursing note reviewed.  Constitutional:      General: He is not in acute  distress.    Appearance: He is well-developed.  HENT:     Head: Normocephalic and atraumatic.     Mouth/Throat:     Mouth: Mucous membranes are moist.  Eyes:     Conjunctiva/sclera: Conjunctivae normal.  Cardiovascular:     Rate and Rhythm: Normal rate and regular rhythm.     Heart sounds: No murmur heard. Pulmonary:     Effort: Pulmonary effort is normal. No respiratory distress.     Breath sounds: Normal breath sounds.  Abdominal:     Palpations: Abdomen is soft.     Tenderness: There is no abdominal tenderness.  Musculoskeletal:        General: No swelling.     Cervical back: Neck supple.  Skin:    General: Skin is warm and dry.     Capillary Refill: Capillary refill takes less than 2 seconds.  Neurological:     General: No focal deficit present.     Mental Status: He is alert and oriented to person, place, and time.  Psychiatric:        Mood and Affect: Mood normal.     ED Results / Procedures / Treatments   Labs (all labs ordered are listed, but only abnormal results are displayed) Labs Reviewed  COMPREHENSIVE METABOLIC PANEL - Abnormal; Notable for the following components:      Result Value   Sodium 133 (*)    BUN 22 (*)    Calcium 8.7 (*)    All other components within normal limits  CBC WITH DIFFERENTIAL/PLATELET - Abnormal; Notable for the following components:   RBC 3.84 (*)    Hemoglobin 10.8 (*)    HCT 33.7 (*)    Neutro Abs 9.2 (*)    Lymphs Abs 0.4 (*)    All other components within normal limits  URINALYSIS, ROUTINE W REFLEX MICROSCOPIC - Abnormal; Notable for the following components:   Ketones, ur 5 (*)    All other components within normal limits  HEMOGLOBIN A1C    EKG EKG Interpretation Date/Time:  Wednesday July 22 2022 10:33:39 EDT Ventricular Rate:  109 PR Interval:  165 QRS Duration:  94 QT Interval:  344 QTC Calculation: 464 R Axis:   134  Text Interpretation: Sinus tachycardia Right axis deviation Low voltage, extremity leads  Abnormal R-wave progression, late transition No significant change since prior 12/23 Confirmed by Meridee Score [phone]) on 07/22/2022 10:37:09 AM  Radiology DG Pelvis Portable  Result Date: 07/22/2022 CLINICAL DATA:  Post reduction EXAM: PORTABLE PELVIS 1-2 VIEWS COMPARISON:  07/22/2022 FINDINGS: Unchanged superior dislocation of the prosthetic femoral head relative to the prosthetic acetabulum. No evidence of regional fracture. Severe degenerative changes in the left hip, unchanged. IMPRESSION: Unchanged superior dislocation of the prosthetic  femoral head relative to the prosthetic acetabulum. Electronically Signed   By: Wiliam Ke M.D.   On: 07/22/2022 12:26   CT Head Wo Contrast  Result Date: 07/22/2022 CLINICAL DATA:  Larey Seat with trauma to the head EXAM: CT HEAD WITHOUT CONTRAST TECHNIQUE: Contiguous axial images were obtained from the base of the skull through the vertex without intravenous contrast. RADIATION DOSE REDUCTION: This exam was performed according to the departmental dose-optimization program which includes automated exposure control, adjustment of the mA and/or kV according to patient size and/or use of iterative reconstruction technique. COMPARISON:  06/07/2022 FINDINGS: Brain: Chronic brain volume loss. Chronic small-vessel ischemic changes of the white matter. 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 No sign of acute infarction, mass lesion, hemorrhage, hydrocephalus or extra-axial collection. Vascular: There is atherosclerotic calcification of the major vessels at the base of the brain. Skull: No skull fracture. Sinuses/Orbits: No acute sinus finding. Retention cyst right maxillary sinus. Orbits negative. Other: None IMPRESSION: No acute or traumatic finding. Chronic brain volume loss and chronic small-vessel ischemic changes of the white matter. 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 Electronically Signed   By: Paulina Fusi M.D.    On: 07/22/2022 09:44   DG Chest Portable 1 View  Result Date: 07/22/2022 CLINICAL DATA:  Dizziness.  Head and neck cancer. EXAM: PORTABLE CHEST 1 VIEW COMPARISON:  05/11/2022 FINDINGS: Power injectable right IJ Port-A-Cath tip: Upper SVC. Clips noted in the neck bilaterally. Borderline elevated right hemidiaphragm. Minimal blunting of the left lateral costophrenic angle. The patient is rotated to the left on today's radiograph, reducing diagnostic sensitivity and specificity. Glenoid spurring bilaterally. Heart size within normal limits. IMPRESSION: 1. Minimal blunting of the left lateral costophrenic angle, potentially from pleural thickening or trace pleural fluid. 2. Borderline elevated right hemidiaphragm. 3. Glenoid spurring bilaterally. Electronically Signed   By: Gaylyn Rong M.D.   On: 07/22/2022 09:43   DG Pelvis 1-2 Views  Result Date: 07/22/2022 CLINICAL DATA:  Fall with pain EXAM: PELVIS - 1-2 VIEW COMPARISON:  01/12/2022 FINDINGS: Superior dislocation of the prosthetic femoral head relative to the prosthetic acetabulum. No evidence of regional fracture. Chronic osteoarthritis of the left hip as seen previously. IMPRESSION: Superior dislocation of the prosthetic femoral head relative to the prosthetic acetabulum. Electronically Signed   By: Paulina Fusi M.D.   On: 07/22/2022 09:36    Procedures .Ortho Injury Treatment  Date/Time: 07/22/2022 11:58 AM  Performed by: Ma Rings, PA-C Authorized by: Ma Rings, PA-C   Consent:    Consent obtained:  Written   Consent given by:  Patient   Risks discussed:  Fracture, nerve damage, restricted joint movement, vascular damage, stiffness, recurrent dislocation and irreducible dislocation   Alternatives discussed:  ReferralPre-procedure distal perfusion: normal Pre-procedure neurological function: normal Pre-procedure range of motion: reduced  Anesthesia: Local anesthesia used: no  Patient sedated: Yes. Refer to sedation  procedure documentation for details of sedation. Post-procedure neurovascular assessment: post-procedure neurovascularly intact Comments: Not able to reduce       Medications Ordered in ED Medications  ibuprofen (ADVIL) tablet 200 mg (has no administration in time range)  oxyCODONE-acetaminophen (PERCOCET/ROXICET) 5-325 MG per tablet 1 tablet (has no administration in time range)  metoprolol tartrate (LOPRESSOR) tablet 12.5 mg (has no administration in time range)  mirtazapine (REMERON) tablet 30 mg (has no administration in time range)  QUEtiapine (SEROQUEL) tablet 300 mg (has no administration in time range)  prochlorperazine (COMPAZINE) tablet 10  mg (has no administration in time range)  magnesium oxide (MAG-OX) tablet 400 mg (has no administration in time range)  pantoprazole (PROTONIX) EC tablet 40 mg (has no administration in time range)  senna-docusate (Senokot-S) tablet 1 tablet (has no administration in time range)  sucralfate (CARAFATE) 1 GM/10ML suspension 1 g (has no administration in time range)  gabapentin (NEURONTIN) capsule 300 mg (has no administration in time range)  methocarbamol (ROBAXIN) tablet 500 mg (has no administration in time range)  feeding supplement (GLUCERNA SHAKE) (GLUCERNA SHAKE) liquid 237 mL (has no administration in time range)  potassium chloride SA (KLOR-CON M) CR tablet 20 mEq (has no administration in time range)  lidocaine (XYLOCAINE) 2 % viscous mouth solution 15 mL (has no administration in time range)  nystatin (MYCOSTATIN) 100000 UNIT/ML suspension 500,000 Units (has no administration in time range)  hydrOXYzine (ATARAX) tablet 25 mg (has no administration in time range)  heparin injection 5,000 Units (has no administration in time range)  sodium chloride flush (NS) 0.9 % injection 3 mL (has no administration in time range)  sodium chloride flush (NS) 0.9 % injection 3 mL (has no administration in time range)  0.9 %  sodium chloride infusion  (has no administration in time range)  insulin aspart (novoLOG) injection 0-9 Units (has no administration in time range)  0.9 %  sodium chloride infusion (has no administration in time range)  morphine (PF) 4 MG/ML injection 4 mg (4 mg Intravenous Given 07/22/22 1030)  ondansetron (ZOFRAN) injection 4 mg (4 mg Intravenous Given 07/22/22 1029)  propofol (DIPRIVAN) 10 mg/mL bolus/IV push 33.5 mg (33.5 mg Intravenous Given 07/22/22 1145)  propofol (DIPRIVAN) 10 mg/mL bolus/IV push (20 mg Intravenous Given 07/22/22 1150)  fentaNYL (SUBLIMAZE) injection 50 mcg (50 mcg Intravenous Given 07/22/22 1225)  fentaNYL (SUBLIMAZE) injection 50 mcg (50 mcg Intravenous Given 07/22/22 1622)    ED Course/ Medical Decision Making/ A&P Clinical Course as of 07/22/22 1943  Wed Jul 22, 2022  1610 Patient brought in by ambulance after unwitnessed fall.  He has a history of some type of throat cancer which is left him not able to verbalize but does communicate via whiteboard.  Complaining of right hip pain.  X-ray showing total hip replacement with dislocation.  Will get rest of workup and likely will need reduction. [MB]  1557 With dislocated hip after fall, has been having increasing difficulty walking at home.  CT head was negative, unable to reduce his hip.  Discussed with orthopedics here who is going to take him to the OR but unfortunately anesthesia does not feel comfortable due to his laryngectomy and lack of ENT backup.  I discussed with Dr. Lajoyce Corners who is on-call.  Unfortunately Dr. Due to his not truly on[place]call [place] so we are trying to establish she was on-call for his group as patient follows with this group as an outpatient.  [CB]  1703 No from orthopedic surgery is willing to take care of the patient, will either reduce his hip tonight or tomorrow morning depending on when patient arrives.  I spoke with hospitalist is willing to admit patient medically.  He has been having worsening problems walking at  home and also told the nurse he has been putting beer through his PEG tube to cope with his problems at home. [CB]    Clinical Course User Index [CB] Ma Rings, PA-C [MB] Terrilee Files, MD  Medical Decision Making This patient presents to the ED for concern of right hip pain after fall, right-sided hip pain, this involves an extensive number of treatment options, and is a complaint that carries with it a high risk of complications and morbidity.  The differential diagnosis includes fracture, dislocation, sprain, strain, intracranial hemorrhage, concussion, other   Co morbidities that complicate the patient evaluation :   Laryngectomy, alcohol abuse   Additional history obtained:  Additional history obtained from EMR External records from outside source obtained and reviewed including notes   Lab Tests:  I Ordered, and personally interpreted labs.  The pertinent results include: CBC CMP and urinalysis, hemoglobin is stable at 10.8   Imaging Studies ordered:  I ordered imaging studies including x-ray pelvis I independently visualized and interpreted imaging which showed dislocation right prosthetic hip superiorly the head shows no intracranial hemorrhage I agree with the radiologist interpretation   Cardiac Monitoring: / EKG:  The patient was maintained on a cardiac monitor.  I personally viewed and interpreted the cardiac monitored which showed an underlying rhythm of: Sinus rhythm   Consultations Obtained:  Please see ED course, discussed Dr. Dallas Schimke who was going to reduce the hip after we were unable to reduce it but unfortunately anesthesia not comfortable doing this here at [facility].  Discussed with Dr. Dion Saucier on-call for [facility].  He is agreeable with taking care of patient from orthopedic standpoint hospitalist is willing to admit.   Problem List / ED Course / Critical interventions / Medication management  Patient has  right hip dislocation, not able to reduce it in the ED with propofol sedation.  Patient not able to be reduced in the OR here by orthopedics due to his laryngectomy as anesthesia was not comfortable sedating him here without ENT backup.  He has got good pulses and sensation of the right lower extremity, pain was controlled with IV narcotics, vitals are stable.  Discussed with hospitalist for admission I ordered medication including fentanyl  for pain, propofol for sedation  Reevaluation of the patient after these medicines showed that the patient improved I have reviewed the patients home medicines and have made adjustments as needed   Social Determinants of Health:  Lives alone      Amount and/or Complexity of Data Reviewed Labs: ordered. Radiology: ordered.  Risk Prescription drug management. Decision regarding hospitalization.           Final Clinical Impression(s) / ED Diagnoses Final diagnoses:  Hip dislocation, right, initial encounter [facility])    Rx / DC Orders ED Discharge Orders     None         Josem Kaufmann 07/22/22 1943    Terrilee Files, MD 07/23/22 [phone]

## 2022-07-22 NOTE — ED Provider Notes (Signed)
.  Sedation  Date/Time: 07/22/2022 11:57 AM  Performed by: Terrilee Files, MD Authorized by: Terrilee Files, MD   Consent:    Consent obtained:  Written   Consent given by:  Patient   Risks discussed:  Allergic reaction, dysrhythmia, inadequate sedation, nausea, vomiting, respiratory compromise necessitating ventilatory assistance and intubation, prolonged sedation necessitating reversal and prolonged hypoxia resulting in organ damage   Alternatives discussed:  Analgesia without sedation and anxiolysis Universal protocol:    Procedure explained and questions answered to patient or proxy's satisfaction: yes     Relevant documents present and verified: yes     Immediately prior to procedure, a time out was called: yes     Patient identity confirmed:  Arm band, hospital-assigned identification number and verbally with patient Indications:    Procedure performed:  Dislocation reduction   Procedure necessitating sedation performed by:  Different physician Pre-sedation assessment:    Time since last food or drink:  4   ASA classification: class 3 - patient with severe systemic disease     Mouth opening:  3 or more finger widths   Thyromental distance:  4 finger widths   Mallampati score:  II - soft palate, uvula, fauces visible   Neck mobility: normal     Pre-sedation assessments completed and reviewed: airway patency, cardiovascular function, hydration status, mental status, nausea/vomiting, pain level, respiratory function and temperature     Pre-sedation assessment completed:  07/22/2022 11:27 AM Immediate pre-procedure details:    Reassessment: Patient reassessed immediately prior to procedure     Reviewed: vital signs, relevant labs/tests and NPO status     Verified: bag valve mask available, emergency equipment available, intubation equipment available, IV patency confirmed, oxygen available and suction available   Procedure details (see MAR for exact dosages):    Preoxygenation:   Nasal cannula   Sedation:  Propofol   Intra-procedure monitoring:  Blood pressure monitoring, cardiac monitor, continuous pulse oximetry, continuous capnometry, frequent LOC assessments and frequent vital sign checks   Intra-procedure events: none     Total Provider sedation time (minutes):  15 Post-procedure details:    Post-sedation assessment completed:  07/22/2022 11:58 AM   Attendance: Constant attendance by certified staff until patient recovered     Recovery: Patient returned to pre-procedure baseline     Post-sedation assessments completed and reviewed: airway patency, cardiovascular function, hydration status, mental status, nausea/vomiting, pain level, respiratory function and temperature     Patient is stable for discharge or admission: yes     Procedure completion:  Tolerated well, no immediate complications Comments:     Unsuccessful reduction     Terrilee Files, MD 07/22/22 1730

## 2022-07-22 NOTE — Anesthesia Preprocedure Evaluation (Addendum)
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 on home beta blockers Normal cardiovascular exam Rhythm:Regular Rate:Normal     Neuro/Psych  PSYCHIATRIC DISORDERS Anxiety Depression     Neuromuscular disease CVA    GI/Hepatic PUD,GERD  ,,(+)     substance abuse  alcohol use, cocaine use and IV drug use  Endo/Other  diabetes, Type 2, Oral Hypoglycemic Agents    Renal/GU negative Renal ROS     Musculoskeletal  (+) Arthritis ,  right hip dislocation   Abdominal   Peds  Hematology  (+) Blood dyscrasia, anemia   Anesthesia Other Findings Day of surgery medications reviewed with the patient.  Reproductive/Obstetrics                             Anesthesia Physical Anesthesia Plan  ASA: 4 and emergent  Anesthesia Plan: General   Post-op Pain Management: Minimal or no pain anticipated   Induction: Intravenous and Rapid sequence  PONV Risk Score and Plan: 2  Airway Management Planned: Tracheostomy  Additional Equipment:   Intra-op Plan:   Post-operative Plan: Extubation in OR  Informed Consent: I have reviewed the patients History and Physical, chart, labs and discussed the procedure including the risks, benefits and alternatives for the proposed anesthesia with the patient or authorized representative  who has indicated his/her understanding and acceptance.     Dental advisory given  Plan Discussed with: CRNA  Anesthesia Plan Comments:         Anesthesia Quick Evaluation

## 2022-07-22 NOTE — ED Notes (Signed)
Portable Xray at bedside.

## 2022-07-22 NOTE — ED Notes (Addendum)
[name] phone left message    calling office for  on call dr .

## 2022-07-22 NOTE — Assessment & Plan Note (Signed)
No recent A1C in EPIC. He reports taking glucophage and using glucerna. Admitting lab with serum glucose of 97.  Plan Hold glucophage  SS coverage

## 2022-07-22 NOTE — Subjective & Objective (Signed)
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 H reports that he had a fall yesterday and had been unable to ambulate since the fall. He presents to EMS to [facility] for evaluation.

## 2022-07-22 NOTE — Assessment & Plan Note (Addendum)
Patient s/p chemo September '23 followed by total glossectomy,laryngectomy and bilateral neck dissection. Cannot talk. Has PEG tube for feeding. He reports he can take oral medications and a mechanical soft diet. Recent followup with oncology, including PET scan indicates no active disease. He will continue scheduled follow up as directed.

## 2022-07-22 NOTE — Sedation Documentation (Signed)
Portable Xray at bedside.

## 2022-07-22 NOTE — ED Notes (Signed)
DR on call :no dr on call for  Dr Lajoyce Corners

## 2022-07-22 NOTE — Assessment & Plan Note (Signed)
Patient with bilateral hip replacements, bilateral knee replacements. Xray reveals superior dislocation of the prosthetic fermoral head, above the acetabulum.  Plan For Ortho reduction, probably in OR. Due to altered anatomy after head and neck surgery general anesthesia or intubation cannot be done safely at [facility]. Patient will be transferred to [facility] for care.

## 2022-07-22 NOTE — H&P (Signed)
History and Physical    Shriyaan Mcaree ZOX:[id number] DOB: [birth year] DOA: 07/22/2022  DOS: the patient was seen and examined on 07/22/2022  PCP: Benita Stabile, MD   Patient coming from: Home  I have personally briefly reviewed patient's old medical records in [facility]  Mr. 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 H reports that he had a fall yesterday and had been unable to ambulate since the fall. He presents to EMS to [facility] for evaluation.    ED Course: 97.8  115/69  HR 99 RR 16 Cmet with Na 133, Glucose 97, Cr 1.15, Hgb 10.3 (chronic) WBC 10.8 Diff 88/4/6. CXR with pleural thickening right based vs pleural fluid. Dg Pelvis - right hip with superior dislocation of prosthetic femroal head above acetabulum w/o fracture. Dr. Dallas Schimke was consults but anesthesiology felt intubation would be unsafe at [facility] due to lack of appropriate equipment or ENT availability. ED-PA contacted Dr. Dion Saucier, on call for ortho at [facility], who will accept patient as consult. [facility] called to admit patient to [facility] and for management of medical problems.   Review of Systems:  Review of Systems  Constitutional:  Positive for weight loss. Negative for chills and fever.  Eyes: Negative.   Respiratory: Negative.    Cardiovascular: Negative.   Gastrointestinal: Negative.   Genitourinary: Negative.        H/o urinary retention. Was unable to micturate leading to placement of foley catheter with released of 1400 cc urine.   Musculoskeletal:  Positive for falls and joint pain.  Skin: Negative.   Neurological: Negative.   Endo/Heme/Allergies: Negative.   Psychiatric/Behavioral: Negative.      Past Medical History:  Diagnosis Date   Anemia    as a child   Anxiety    Arthritis    "everywhere" (04/21/2012)   Cancer (HCC)    throat and tongue  cancer   Cellulitis and abscess of foot 12/19/2013   LEFT FOOT   Cellulitis of right foot    Chronic pain    Constipation    DDD (degenerative disc disease)    Depression    pt denies   Diabetes mellitus without complication (HCC)    borderline   Diabetic foot ulcer (HCC)    ETOH abuse    Full dentures    GERD (gastroesophageal reflux disease)    tums   H/O amputation of lesser toe, right (HCC) 07/29/2017   History of blood transfusion    "related to left knee OR; probably right hip too" (04/21/2012)   History of kidney stones    Mental disorder    MRSA (methicillin resistant staph aureus) culture positive    Neuromuscular disorder (HCC)    neuropathy   Noncompliance    Open wound    bottom of foot   Osteomyelitis (HCC)    right 4th toe   Osteomyelitis of right foot (HCC)    Pneumonia ~ 2012   Polysubstance abuse (HCC)    etoh, cocaine, heroin   Stroke [facility]) 2008   "they said I might have had one during right hip replacement" (04/21/2012)   Wears glasses     Past Surgical History:  Procedure Laterality Date   AMPUTATION Right 06/09/2017   Procedure: RIGHT 2ND TOE AMPUTATION;  Surgeon: Nadara Mustard, MD;  Location: [facility] OR;  Service: Orthopedics;  Laterality: Right;   AMPUTATION Right 07/23/2017   Procedure: RIGHT 4TH TOE AMPUTATION;  Surgeon: Nadara Mustard, MD;  Location: [facility] OR;  Service: Orthopedics;  Laterality: Right;   AMPUTATION Right 06/15/2018   Procedure: RIGHT FOOT TRANSMETATARSAL AMPUTATION;  Surgeon: Nadara Mustard, MD;  Location: [facility] OR;  Service: Orthopedics;  Laterality: Right;  right   EXCISIONAL TOTAL KNEE ARTHROPLASTY WITH ANTIBIOTIC SPACERS Right 11/23/2018   Procedure: REMOVAL OF RIGHT TOTAL KNEE ARTHROPLASTY AND PLACEMENT OF ANATOMIC ANTIBIOTIC SPACER;  Surgeon: Nadara Mustard, MD;  Location: [facility] OR;  Service: Orthopedics;  Laterality: Right;   I & D EXTREMITY Right 11/18/2018   Procedure: RIGHT KNEE DEBRIDEMENT;  Surgeon: Nadara Mustard, MD;  Location:  [facility] OR;  Service: Orthopedics;  Laterality: Right;   JOINT REPLACEMENT     KNEE ARTHROSCOPY Bilateral 1980's/1990's   LUNG LOBECTOMY Left ~ 2006   LUNG LOBECTOMY     METATARSAL OSTEOTOMY  10/29/2011   Procedure: METATARSAL OSTEOTOMY;  Surgeon: Nadara Mustard, MD;  Location: [facility] OR;  Service: Orthopedics;  Laterality: Left;  Left 1st Metatarsal Dorsal Closing Wedge    METATARSAL OSTEOTOMY Right 04/28/2017   Procedure: RIGHT 1ST METATARSAL DORSAL CLOSING WEDGE OSTEOTOMY;  Surgeon: Nadara Mustard, MD;  Location: [facility] OR;  Service: Orthopedics;  Laterality: Right;   MULTIPLE TOOTH EXTRACTIONS     REVISION TOTAL HIP ARTHROPLASTY Right 2008   "4-5 months after replacement" (04/21/2012)   THROAT SURGERY     TONGUE SURGERY     TOTAL HIP ARTHROPLASTY Right 2008   TOTAL KNEE ARTHROPLASTY Left 2006   TOTAL KNEE ARTHROPLASTY Right 04/20/2012   TOTAL KNEE ARTHROPLASTY Right 04/20/2012   Procedure: TOTAL KNEE ARTHROPLASTY;  Surgeon: Nadara Mustard, MD;  Location: [facility] OR;  Service: Orthopedics;  Laterality: Right;  Right Total Knee Arthroplasty   TOTAL KNEE REVISION Right 03/22/2019   Procedure: RIGHT TOTAL KNEE ARTHROPLASTY REVISION OF ALL COMPONENTS;  Surgeon: Nadara Mustard, MD;  Location: [facility] OR;  Service: Orthopedics;  Laterality: Right;    Soc Hx - patient after release from [facility] has been living alone. Son reports concerns about ability to manage ADLs and management of complex condition. He has a mother and a sister also involved in his care.    reports that he quit smoking about 21 months ago. His smoking use included cigarettes. He has a 15.00 pack-year smoking history. He has never used smokeless tobacco. He reports that he does not currently use alcohol. He reports that he does not currently use drugs after having used the following drugs: Cocaine.  Allergies  Allergen Reactions   Benadryl [Diphenhydramine Hcl] Other (See Comments)    Leg spasms    Trazodone And Nefazodone Other (See  Comments)    Leg spasms     Family History  Problem Relation Age of Onset   Diabetes Father     Prior to Admission medications   Medication Sig Start Date End Date Taking? Authorizing Provider  acetaminophen (TYLENOL) 325 MG tablet Take 1-2 tablets (325-650 mg total) by mouth every 6 (six) hours as needed for mild pain (pain score 1-3 or temp > 100.5). 03/24/19   Persons, [place], PA  feeding supplement, GLUCERNA SHAKE, (GLUCERNA SHAKE) LIQD Take 237 mLs by mouth 3 (three) times daily between meals. 03/24/19   Persons, [place], PA  fluconazole (DIFLUCAN) 200 MG tablet Take 200 mg by mouth daily. 04/18/21   [provider]  gabapentin (NEURONTIN) 300 MG capsule  Take 1 capsule (300 mg total) by mouth 3 (three) times daily. 03/24/19   Persons, [place], PA  hydrOXYzine (VISTARIL) 25 MG capsule Take 25 mg by mouth 3 (three) times daily. 04/17/21   [provider]  ibuprofen (ADVIL) 200 MG tablet Take 200 mg by mouth every 6 (six) hours as needed.    [provider]  lidocaine (XYLOCAINE) 2 % solution Use as directed in the mouth or throat daily at 6 (six) AM. 03/06/21   [provider]  magnesium oxide (MAG-OX) 400 MG tablet Take 1 tablet by mouth 2 (two) times daily. 03/17/21   [provider]  metFORMIN (GLUCOPHAGE) 500 MG tablet Take 2 tablets (1,000 mg total) by mouth 2 (two) times daily with a meal. Patient taking differently: Take 500 mg by mouth 2 (two) times daily with a meal. 06/17/18   Darlin Drop, DO  methocarbamol (ROBAXIN) 500 MG tablet Take 1 tablet (500 mg total) by mouth 2 (two) times daily. 12/09/21   Schutt, Edsel Petrin, PA-C  metoprolol tartrate (LOPRESSOR) 25 MG tablet Take 12.5 mg by mouth 2 (two) times daily. 04/17/21   [provider]  mirtazapine (REMERON) 30 MG tablet Take 30 mg by mouth at bedtime. 04/17/21   [provider]  nystatin (MYCOSTATIN) 100000 UNIT/ML suspension Take 5 mLs by mouth 4 (four) times daily.  04/17/21   [provider]  oxyCODONE-acetaminophen (PERCOCET/ROXICET) 5-325 MG tablet Take 1 tablet by mouth every 4 (four) hours as needed. 01/15/22   Triplett, Tammy, PA-C  pantoprazole (PROTONIX) 40 MG tablet Take 1 tablet (40 mg total) by mouth 2 (two) times daily. 06/10/21   Carlan, Chelsea L, NP  potassium chloride SA (KLOR-CON M) 20 MEQ tablet Take 20 mEq by mouth daily. 03/13/21   [provider]  prochlorperazine (COMPAZINE) 10 MG tablet Take 10 mg by mouth daily at 6 (six) AM. 03/21/21   [provider]  QUEtiapine (SEROQUEL) 300 MG tablet Take 300 mg by mouth at bedtime. 02/21/21   [provider]  sennosides-docusate sodium (SENOKOT-S) 8.6-50 MG tablet Take 1 tablet by mouth at bedtime.    [provider]  sucralfate (CARAFATE) 1 GM/10ML suspension Take by mouth 4 (four) times daily. 04/18/21   [provider]    Physical Exam: Vitals:   07/22/22 1215 07/22/22 1230 07/22/22 1406 07/22/22 1645  BP: 103/70 115/69  (!) 124/90  Pulse: 99 99  98  Resp: 13 16  15   Temp:   97.8 F (36.6 C)   TempSrc:   Oral   SpO2: 96% 99%  97%  Weight:        Physical Exam Vitals and nursing note reviewed.  Constitutional:      General: He is not in acute distress.    Appearance: He is not ill-appearing.     Comments: Emaciated. Patient cannot speak after total laryngectomy and uses a dry board to write responses, questions, etc.   HENT:     Head: Normocephalic and atraumatic.     Mouth/Throat:     Mouth: Mucous membranes are dry.     Pharynx: Oropharynx is clear. No oropharyngeal exudate.     Comments: Post-surgical changes to mouth and neck. Very full cheeks Eyes:     Extraocular Movements: Extraocular movements intact.     Conjunctiva/sclera: Conjunctivae normal.     Pupils: Pupils are equal, round, and reactive to light.  Neck:     Comments: Open tracheostomy center of neck. Cardiovascular:  Rate and Rhythm: Regular rhythm.  Tachycardia present.     Pulses: Normal pulses.     Heart sounds: Normal heart sounds.  Pulmonary:     Effort: Pulmonary effort is normal. No respiratory distress.     Breath sounds: Normal breath sounds. No wheezing, rhonchi or rales.  Abdominal:     General: Abdomen is flat. Bowel sounds are normal.     Palpations: Abdomen is soft.     Tenderness: There is no abdominal tenderness. There is no guarding.     Comments: PEG tube upper abdomen. Site is clean and clear.   Genitourinary:    Penis: Normal.      Comments: Foley catheter in place Musculoskeletal:     Cervical back: No rigidity.     Comments: Pain with passive movement right leg. No other deformity noted.  Lymphadenopathy:     Cervical: No cervical adenopathy.  Skin:    General: Skin is warm and dry.  Neurological:     General: No focal deficit present.     Mental Status: He is alert and oriented to person, place, and time. Mental status is at baseline.     Cranial Nerves: No cranial nerve deficit.  Psychiatric:        Mood and Affect: Mood normal.        Behavior: Behavior normal.      Labs on Admission: I have personally reviewed following labs and imaging studies  CBC: Recent Labs  Lab 07/22/22 0920  WBC 10.3  NEUTROABS 9.2*  HGB 10.8*  HCT 33.7*  MCV 87.8  PLT 229   Basic Metabolic Panel: Recent Labs  Lab 07/22/22 0920  NA 133*  K 4.1  CL 99  CO2 23  GLUCOSE 97  BUN 22*  CREATININE 1.15  CALCIUM 8.7*   GFR: CrCl cannot be calculated (Unknown ideal weight.). Liver Function Tests: Recent Labs  Lab 07/22/22 0920  AST 36  ALT 22  ALKPHOS 75  BILITOT 1.0  PROT 7.0  ALBUMIN 3.7   No results for input(s): "LIPASE", "AMYLASE" in the last 168 hours. No results for input(s): "AMMONIA" in the last 168 hours. Coagulation Profile: No results for input(s): "INR", "PROTIME" in the last 168 hours. Cardiac Enzymes: No results for input(s): "CKTOTAL", "CKMB", "CKMBINDEX", "TROPONINI" in the last  168 hours. BNP (last 3 results) No results for input(s): "PROBNP" in the last 8760 hours. HbA1C: No results for input(s): "HGBA1C" in the last 72 hours. CBG: No results for input(s): "GLUCAP" in the last 168 hours. Lipid Profile: No results for input(s): "CHOL", "HDL", "LDLCALC", "TRIG", "CHOLHDL", "LDLDIRECT" in the last 72 hours. Thyroid Function Tests: No results for input(s): "TSH", "T4TOTAL", "FREET4", "T3FREE", "THYROIDAB" in the last 72 hours. Anemia Panel: No results for input(s): "VITAMINB12", "FOLATE", "FERRITIN", "TIBC", "IRON", "RETICCTPCT" in the last 72 hours. Urine analysis:    Component Value Date/Time   COLORURINE YELLOW 07/22/2022 1629   APPEARANCEUR CLEAR 07/22/2022 1629   LABSPEC 1.012 07/22/2022 1629   PHURINE 5.0 07/22/2022 1629   GLUCOSEU NEGATIVE 07/22/2022 1629   HGBUR NEGATIVE 07/22/2022 1629   BILIRUBINUR NEGATIVE 07/22/2022 1629   KETONESUR 5 (A) 07/22/2022 1629   PROTEINUR NEGATIVE 07/22/2022 1629   UROBILINOGEN 0.2 12/18/2013 1402   NITRITE NEGATIVE 07/22/2022 1629   LEUKOCYTESUR NEGATIVE 07/22/2022 1629    Radiological Exams on Admission: I have personally reviewed images DG Pelvis Portable  Result Date: 07/22/2022 CLINICAL DATA:  Post reduction EXAM: PORTABLE PELVIS 1-2 VIEWS COMPARISON:  07/22/2022 FINDINGS: Unchanged  superior dislocation of the prosthetic femoral head relative to the prosthetic acetabulum. No evidence of regional fracture. Severe degenerative changes in the left hip, unchanged. IMPRESSION: Unchanged superior dislocation of the prosthetic femoral head relative to the prosthetic acetabulum. Electronically Signed   By: Wiliam Ke M.D.   On: 07/22/2022 12:26   CT Head Wo Contrast  Result Date: 07/22/2022 CLINICAL DATA:  Larey Seat with trauma to the head EXAM: CT HEAD WITHOUT CONTRAST TECHNIQUE: Contiguous axial images were obtained from the base of the skull through the vertex without intravenous contrast. RADIATION DOSE REDUCTION: This  exam was performed according to the departmental dose-optimization program which includes automated exposure control, adjustment of the mA and/or kV according to patient size and/or use of iterative reconstruction technique. COMPARISON:  06/07/2022 FINDINGS: Brain: Chronic brain volume loss. Chronic small-vessel ischemic changes of the white matter. 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 No sign of acute infarction, mass lesion, hemorrhage, hydrocephalus or extra-axial collection. Vascular: There is atherosclerotic calcification of the major vessels at the base of the brain. Skull: No skull fracture. Sinuses/Orbits: No acute sinus finding. Retention cyst right maxillary sinus. Orbits negative. Other: None IMPRESSION: No acute or traumatic finding. Chronic brain volume loss and chronic small-vessel ischemic changes of the white matter. 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 Electronically Signed   By: Paulina Fusi M.D.   On: 07/22/2022 09:44   DG Chest Portable 1 View  Result Date: 07/22/2022 CLINICAL DATA:  Dizziness.  Head and neck cancer. EXAM: PORTABLE CHEST 1 VIEW COMPARISON:  05/11/2022 FINDINGS: Power injectable right IJ Port-A-Cath tip: Upper SVC. Clips noted in the neck bilaterally. Borderline elevated right hemidiaphragm. Minimal blunting of the left lateral costophrenic angle. The patient is rotated to the left on today's radiograph, reducing diagnostic sensitivity and specificity. Glenoid spurring bilaterally. Heart size within normal limits. IMPRESSION: 1. Minimal blunting of the left lateral costophrenic angle, potentially from pleural thickening or trace pleural fluid. 2. Borderline elevated right hemidiaphragm. 3. Glenoid spurring bilaterally. Electronically Signed   By: Gaylyn Rong M.D.   On: 07/22/2022 09:43   DG Pelvis 1-2 Views  Result Date: 07/22/2022 CLINICAL DATA:  Fall with pain EXAM: PELVIS - 1-2 VIEW COMPARISON:   01/12/2022 FINDINGS: Superior dislocation of the prosthetic femoral head relative to the prosthetic acetabulum. No evidence of regional fracture. Chronic osteoarthritis of the left hip as seen previously. IMPRESSION: Superior dislocation of the prosthetic femoral head relative to the prosthetic acetabulum. Electronically Signed   By: Paulina Fusi M.D.   On: 07/22/2022 09:36    EKG: I have personally reviewed EKG: sinus tachycardia, RAD, low voltage, no acute changes, no change to prior in 12/23  Assessment/Plan Principal Problem:   Closed traumatic dislocation of right hip joint (HCC) Active Problems:   Alcohol dependence (HCC)   Dislocation, hip, right, subsequent encounter   Generalized anxiety disorder   Diabetes mellitus type 2 in nonobese (HCC)   Anemia    Assessment and Plan: Dislocation, hip, right, subsequent encounter Patient with bilateral hip replacements, bilateral knee replacements. Xray reveals superior dislocation of the prosthetic fermoral head, above the acetabulum.  Plan For Ortho reduction, probably in OR. Due to altered anatomy after head and neck surgery general anesthesia or intubation cannot be done safely at [facility]. Patient will be transferred to [facility] for care.  Alcohol dependence (HCC) Patient reports he stopped drinking several years ago and denies any alcohol intake.   Oropharyngeal cancer (HCC)-resolved as  of 07/22/2022 Patient s/p chemo September '23 followed by total glossectomy,laryngectomy and bilateral neck dissection. Cannot talk. Has PEG tube for feeding. He reports he can take oral medications and a mechanical soft diet. Recent followup with oncology, including PET scan indicates no active disease. He will continue scheduled follow up as directed.  Anemia He has chronic anemia 2/2 multiple medical and nutritional issues with his current Hgb at his baseline  Diabetes mellitus type 2 in nonobese (HCC) No recent A1C in EPIC. He reports taking glucophage and  using glucerna. Admitting lab with serum glucose of 97.  Plan Hold glucophage  SS coverage  Generalized anxiety disorder Patient appears to be calm and without agitation.  Plan Continue home medications   Disposition - spoke with son Ejay Farland who has been his primary care taker and helps him with meds, ADLs, etc. He reports that Mr. Fels has been doing OK and his son feels he can manage him at home. Will need HH PT and nursing.  TOC order placed.     DVT prophylaxis: SQ Heparin Code Status: Full Code-discussed with patient and he wishes full resuscitastive effort Family Communication: spoke son Juniel Bernie - his primary caregiver  Disposition Plan: TBD  Consults called: Ortho - Dr. Dallas Schimke -[facility], Dr. Dion Saucier -[facility]  Admission status: Inpatient, Med-Surg   Illene Regulus, MD [facility] 07/22/2022, 5:52 PM

## 2022-07-22 NOTE — Consult Note (Addendum)
ORTHOPAEDIC CONSULTATION  REQUESTING PHYSICIAN: Norins, Rosalyn Gess, MD  Chief Complaint: Right hip pain  HPI: [name] is a 57 y.o. male who complains of right hip pain unable to walk since last night.  He had a total hip replacement done about 20 years ago, and was transferred from [facility] for definitive management of a prosthetic hip dislocation because he has had previous laryngectomy, and a tracheostomy, and anesthesia was not comfortable managing his airway.  Pain worse with movement, better with rest.  He is alert and oriented, although obtaining history is challenging because he cannot speak, and is very limited in his ability to write.  He says this is the first time it has dislocated.  Past Medical History:  Diagnosis Date   Anemia    as a child   Anxiety    Arthritis    "everywhere" (04/21/2012)   Cancer (HCC)    throat and tongue cancer   Cellulitis and abscess of foot 12/19/2013   LEFT FOOT   Cellulitis of right foot    Chronic pain    Constipation    DDD (degenerative disc disease)    Depression    pt denies   Diabetes mellitus without complication (HCC)    borderline   Diabetic foot ulcer (HCC)    ETOH abuse    Full dentures    GERD (gastroesophageal reflux disease)    tums   H/O amputation of lesser toe, right (HCC) 07/29/2017   History of blood transfusion    "related to left knee OR; probably right hip too" (04/21/2012)   History of kidney stones    Mental disorder    MRSA (methicillin resistant staph aureus) culture positive    Neuromuscular disorder (HCC)    neuropathy   Noncompliance    Open wound    bottom of foot   Osteomyelitis (HCC)    right 4th toe   Osteomyelitis of right foot (HCC)    Pneumonia ~ 2012   Polysubstance abuse (HCC)    etoh, cocaine, heroin   Stroke [facility]) 2008   "they said I might have had one during right hip replacement" (04/21/2012)   Wears glasses    Past Surgical History:  Procedure Laterality Date   AMPUTATION  Right 06/09/2017   Procedure: RIGHT 2ND TOE AMPUTATION;  Surgeon: Nadara Mustard, MD;  Location: [facility] OR;  Service: Orthopedics;  Laterality: Right;   AMPUTATION Right 07/23/2017   Procedure: RIGHT 4TH TOE AMPUTATION;  Surgeon: Nadara Mustard, MD;  Location: [facility] OR;  Service: Orthopedics;  Laterality: Right;   AMPUTATION Right 06/15/2018   Procedure: RIGHT FOOT TRANSMETATARSAL AMPUTATION;  Surgeon: Nadara Mustard, MD;  Location: [facility] OR;  Service: Orthopedics;  Laterality: Right;  right   EXCISIONAL TOTAL KNEE ARTHROPLASTY WITH ANTIBIOTIC SPACERS Right 11/23/2018   Procedure: REMOVAL OF RIGHT TOTAL KNEE ARTHROPLASTY AND PLACEMENT OF ANATOMIC ANTIBIOTIC SPACER;  Surgeon: Nadara Mustard, MD;  Location: [facility] OR;  Service: Orthopedics;  Laterality: Right;   I & D EXTREMITY Right 11/18/2018   Procedure: RIGHT KNEE DEBRIDEMENT;  Surgeon: Nadara Mustard, MD;  Location: [facility] OR;  Service: Orthopedics;  Laterality: Right;   JOINT REPLACEMENT     KNEE ARTHROSCOPY Bilateral 1980's/1990's   LUNG LOBECTOMY Left ~ 2006   LUNG LOBECTOMY     METATARSAL OSTEOTOMY  10/29/2011   Procedure: METATARSAL OSTEOTOMY;  Surgeon: Nadara Mustard, MD;  Location: [facility] OR;  Service: Orthopedics;  Laterality: Left;  Left 1st Metatarsal Dorsal Closing  Wedge    METATARSAL OSTEOTOMY Right 04/28/2017   Procedure: RIGHT 1ST METATARSAL DORSAL CLOSING WEDGE OSTEOTOMY;  Surgeon: Nadara Mustard, MD;  Location: [facility] OR;  Service: Orthopedics;  Laterality: Right;   MULTIPLE TOOTH EXTRACTIONS     REVISION TOTAL HIP ARTHROPLASTY Right 2008   "4-5 months after replacement" (04/21/2012)   THROAT SURGERY     TONGUE SURGERY     TOTAL HIP ARTHROPLASTY Right 2008   TOTAL KNEE ARTHROPLASTY Left 2006   TOTAL KNEE ARTHROPLASTY Right 04/20/2012   TOTAL KNEE ARTHROPLASTY Right 04/20/2012   Procedure: TOTAL KNEE ARTHROPLASTY;  Surgeon: Nadara Mustard, MD;  Location: [facility] OR;  Service: Orthopedics;  Laterality: Right;  Right Total Knee Arthroplasty   TOTAL KNEE  REVISION Right 03/22/2019   Procedure: RIGHT TOTAL KNEE ARTHROPLASTY REVISION OF ALL COMPONENTS;  Surgeon: Nadara Mustard, MD;  Location: [facility] OR;  Service: Orthopedics;  Laterality: Right;   Social History   Socioeconomic History   Marital status: Divorced    Spouse name: Not on file   Number of children: Not on file   Years of education: Not on file   Highest education level: Not on file  Occupational History   Not on file  Tobacco Use   Smoking status: Former    Packs/day: 0.50    Years: 30.00    Additional pack years: 0.00    Total pack years: 15.00    Types: Cigarettes    Quit date: 10/2020    Years since quitting: 1.7   Smokeless tobacco: Never   Tobacco comments:    thinking about quitting, talking with PCP  Vaping Use   Vaping Use: Former  Substance and Sexual Activity   Alcohol use: Not Currently    Alcohol/week: 0.0 standard drinks of alcohol   Drug use: Not Currently    Types: Cocaine    Comment: Cocaine and heroin - 10 years ago   Sexual activity: Yes    Birth control/protection: None  Other Topics Concern   Not on file  Social History Narrative   Not on file   Social Determinants of Health   Financial Resource Strain: Not on file  Food Insecurity: Not on file  Transportation Needs: Not on file  Physical Activity: Not on file  Stress: Not on file  Social Connections: Not on file   Family History  Problem Relation Age of Onset   Diabetes Father    Allergies  Allergen Reactions   Benadryl [Diphenhydramine Hcl] Other (See Comments)    Leg spasms    Trazodone And Nefazodone Other (See Comments)    Leg spasms      Positive ROS: All other systems have been reviewed and were otherwise negative with the exception of those mentioned in the HPI and as above.  Full review of systems is challenging to obtain because of his limited ability to speak.  Physical Exam:  BP 135/87 (BP Location: Left Arm)   Pulse 92   Temp 98.5 F (36.9 C) (Oral)   Resp 18    Wt 66.9 kg   SpO2 100%   BMI 18.44 kg/m   General: Alert, no acute distress Cardiovascular: No pedal edema Respiratory: No cyanosis, no use of accessory musculature GI: No organomegaly, abdomen is soft and non-tender Skin: No lesions in the area of chief complaint, his surgical wounds are healed well Neurologic: Sensation intact distally Psychiatric: Patient is competent for consent with normal mood and affect Lymphatic: No axillary or cervical lymphadenopathy  MUSCULOSKELETAL:  Left hip is held in a fixed internally rotated position.  EHL and FHL is intact.  Assessment: Principal Problem:   Closed traumatic dislocation of right hip joint (HCC) Active Problems:   Alcohol dependence (HCC)   Generalized anxiety disorder   Diabetes mellitus type 2 in nonobese (HCC)   Anemia   Dislocation, hip, right, subsequent encounter   Plan: Closed reduction, right prosthetic hip dislocation.  This is an acute severe injury, and carries risk for recurrence.  I discussed the risks benefits and alternatives to closed reduction, he also has risk for periprosthetic fracture.  All questions been answered and plan to proceed accordingly.  Plan for close reduction this evening.    Eulas Post, MD Cell [phone]   07/22/2022 9:07 PM

## 2022-07-23 ENCOUNTER — Encounter (HOSPITAL_COMMUNITY): Payer: Self-pay | Admitting: Orthopedic Surgery

## 2022-07-23 DIAGNOSIS — S73004A Unspecified dislocation of right hip, initial encounter: Secondary | ICD-10-CM | POA: Diagnosis not present

## 2022-07-23 DIAGNOSIS — S73006A Unspecified dislocation of unspecified hip, initial encounter: Secondary | ICD-10-CM | POA: Diagnosis present

## 2022-07-23 LAB — GLUCOSE, CAPILLARY
Glucose-Capillary: 82 mg/dL (ref 70–99)
Glucose-Capillary: 175 mg/dL — ABNORMAL HIGH (ref 70–99)
Glucose-Capillary: 50 mg/dL — ABNORMAL LOW (ref 70–99)
Glucose-Capillary: 79 mg/dL (ref 70–99)
Glucose-Capillary: 141 mg/dL — ABNORMAL HIGH (ref 70–99)

## 2022-07-23 MED ORDER — TAMSULOSIN HCL 0.4 MG PO CAPS
0.4000 mg | ORAL_CAPSULE | Freq: Every day | ORAL | Status: DC
  Administered 2022-07-23 – 2022-07-24 (×2): 0.4 mg via ORAL
  Filled 2022-07-23 (×2): qty 1

## 2022-07-23 MED ORDER — ALTEPLASE 2 MG IJ SOLR
2.0000 mg | Freq: Once | INTRAMUSCULAR | Status: DC
  Filled 2022-07-23: qty 2

## 2022-07-23 MED ORDER — LEVOTHYROXINE SODIUM 75 MCG PO TABS
75.0000 ug | ORAL_TABLET | Freq: Every day | ORAL | Status: DC
  Administered 2022-07-23 – 2022-07-24 (×2): 75 ug via ORAL
  Filled 2022-07-23 (×2): qty 1

## 2022-07-23 MED ORDER — LORAZEPAM 0.5 MG PO TABS
0.5000 mg | ORAL_TABLET | Freq: Three times a day (TID) | ORAL | Status: DC | PRN
  Administered 2022-07-24: 0.5 mg via ORAL
  Filled 2022-07-23: qty 1

## 2022-07-23 NOTE — Progress Notes (Addendum)
Patient arrived from [facility] ~2000 this shift in stable condition. Call from surgeon [phone] informing that patient will be having surgery tonight, number provided to call report.  ~2035 report called to pre op nurse [phone] Report received from PACU, and patient transported back to 5N29. 2257 transported back to unit in stable condition. Respiratory called, and attended bedside to assess patient.  Provider notified for monitoring order Laryngectomy emergency procedure placard placed over head of patients bed.

## 2022-07-23 NOTE — Care Management CC44 (Signed)
Condition Code 44 Documentation Completed  Patient Details  Name: Karan Bosque MRN: [id number] Date of Birth: [birth year]   Condition Code 44 given:  Yes Patient signature on Condition Code 44 notice:  Yes Documentation of 2 MD's agreement:  Yes Code 44 added to claim:  Yes    Epifanio Lesches, RN 07/23/2022, 12:30 PM

## 2022-07-23 NOTE — Progress Notes (Signed)
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  Patient sustained a fall at home and had been unable to ambulate since the fall.  He was brought into the emergency department and was found to have dislocation of the prosthetic femoral head above the acetabulum.  Orthopedics was consulted.  Patient was transferred over to [facility].  He underwent closed reduction in the OR.    Consultants: Orthopedics  Procedures: Closed reduction in the OR    Subjective/Interval History: Denies any pain this morning.  He communicates by writing on a white board.  He mentioned that he does not have anybody at home to take care of him.  According to H&P he lives with his son.  Patient mentioned that he lives with his parents.  Need to sort this out.    Assessment/Plan:  Dislocation of the right prosthetic hip Seen by orthopedics and underwent closed reduction in the OR last night.  PT and OT consulted.  Was not able to ambulate with the physical therapist today.  Continue to monitor for now.  History of oropharyngeal cancer status post glossectomy laryngectomy He has a tracheostomy. He has a PEG tube for nutrition but is able to take a mechanical soft diet by mouth.  Normocytic anemia No evidence of overt bleeding.  Recheck labs tomorrow.  Diabetes mellitus type 2 in nonobese Holding Glucophage.  Monitor CBGs.  Previous history of alcohol dependence No recent consumption.  Generalized anxiety disorder. Stable.   DVT Prophylaxis: Subcutaneous heparin Code Status: Full code Family Communication: No family at bedside Disposition Plan: Hopefully return home when improved.  Foley  catheter was placed in the ER.  Can be discontinued.  Status is: Observation The patient remains OBS appropriate and will d/c before 2 midnights.      Medications: Scheduled:  acetaminophen  1,000 mg Oral Once   alteplase  2 mg Intracatheter Once   Chlorhexidine Gluconate Cloth  6 each Topical Q0600   feeding supplement (GLUCERNA SHAKE)  237 mL Oral TID BM   gabapentin  300 mg Oral TID   heparin  5,000 Units Subcutaneous Q8H   hydrOXYzine  25 mg Oral TID   insulin aspart  0-9 Units Subcutaneous TID WC   lidocaine  15 mL Mouth/Throat Q0600   magnesium oxide  400 mg Oral BID   methocarbamol  500 mg Oral BID   metoprolol tartrate  12.5 mg Oral BID   mirtazapine  30 mg Oral QHS   mupirocin ointment  1 Application Nasal BID   nystatin  5 mL Oral QID   pantoprazole  40 mg Oral BID   potassium chloride SA  20 mEq Oral Daily   prochlorperazine  10 mg Oral Q0600   QUEtiapine  300 mg Oral QHS   senna-docusate  1 tablet Oral Daily   sodium chloride flush  3 mL Intravenous Q12H   sucralfate  1 g Per Tube TID WC & HS   Continuous:  sodium chloride 75 mL/hr at 07/22/22 2300   NFA:[id number], oxyCODONE-acetaminophen, sodium chloride flush  Antibiotics: Anti-infectives (From admission, onward)    None       Objective:  Vital Signs  Vitals:   07/23/22 0007 07/23/22 0446 07/23/22 0447  07/23/22 0821  BP:   (!) 102/52 102/62  Pulse: 86 92 (!) 48 91  Resp: 15  14 17   Temp:   98.6 F (37 C) 98.5 F (36.9 C)  TempSrc:   Oral Oral  SpO2:   91% 100%  Weight:        Intake/Output Summary (Last 24 hours) at 07/23/2022 1155 Last data filed at 07/23/2022 0645 Gross per 24 hour  Intake 1100 ml  Output 3325 ml  Net -2225 ml   Filed Weights   07/22/22 1100 07/22/22 1120  Weight: 66.9 kg 66.9 kg    General appearance: Awake alert.  In no distress Tracheostomy noted. Resp: Clear to auscultation bilaterally.  Normal effort Cardio: S1-S2 is normal regular.  No S3-S4.  No rubs  murmurs or bruit GI: Abdomen is soft.  Nontender nondistended.  Bowel sounds are present normal.  No masses organomegaly.  PEG tube noted. Neurologic: Alert and oriented x3.  No focal neurological deficits.    Lab Results:  Data Reviewed: I have personally reviewed following labs and reports of the imaging studies  CBC: Recent Labs  Lab 07/22/22 0920  WBC 10.3  NEUTROABS 9.2*  HGB 10.8*  HCT 33.7*  MCV 87.8  PLT 229    Basic Metabolic Panel: Recent Labs  Lab 07/22/22 0920  NA 133*  K 4.1  CL 99  CO2 23  GLUCOSE 97  BUN 22*  CREATININE 1.15  CALCIUM 8.7*    GFR: CrCl cannot be calculated (Unknown ideal weight.).  Liver Function Tests: Recent Labs  Lab 07/22/22 0920  AST 36  ALT 22  ALKPHOS 75  BILITOT 1.0  PROT 7.0  ALBUMIN 3.7     HbA1C: Recent Labs    07/22/22 0920  HGBA1C 5.0    CBG: Recent Labs  Lab 07/22/22 2203 07/22/22 2232 07/22/22 2255 07/23/22 0834  GLUCAP 68* 132* 131* 82    Recent Results (from the past 240 hour(s))  Surgical pcr screen     Status: Abnormal   Collection Time: 07/22/22  8:27 PM   Specimen: Nasal Mucosa; Nasal Swab  Result Value Ref Range Status   MRSA, PCR POSITIVE (A) NEGATIVE Final    Comment: RESULT CALLED TO, READ BACK BY AND VERIFIED WITH: RN KIM [facility] ON 07/22/22 @ 2225 BY DRT    Staphylococcus aureus POSITIVE (A) NEGATIVE Final    Comment: (NOTE) The Xpert SA Assay (FDA approved for NASAL specimens in patients 81 years of age and older), is one component of a comprehensive surveillance program. It is not intended to diagnose infection nor to guide or monitor treatment. Performed at [facility], [address] [place], [place] [address]       Radiology Studies: DG HIP UNILAT WITH PELVIS 2-3 VIEWS RIGHT  Result Date: 07/22/2022 CLINICAL DATA:  Status post right hip reduction EXAM: DG HIP (WITH OR WITHOUT PELVIS) 2-3V RIGHT COMPARISON:  Films from earlier in the same day. FINDINGS: Femoral  component has been reduced into the acetabular component. No acute fracture is seen. IMPRESSION: Status post close reduction of right hip prosthesis. Electronically Signed   By: Alcide Clever M.D.   On: 07/22/2022 22:31   DG HIP UNILAT WITH PELVIS 1V RIGHT  Result Date: 07/22/2022 CLINICAL DATA:  Right prosthetic hip dislocation reduction EXAM: DG HIP (WITH OR WITHOUT PELVIS) 1V RIGHT FLUOROSCOPY TIME:  Fluoroscopy Time:  7.5 seconds Radiation Exposure Index (if provided by the fluoroscopic device): 0.73 mGy Number of Acquired Spot  Images: 2 COMPARISON:  07/22/2022 at 12:05 p.m. FINDINGS: Multiple intraoperative fluoroscopic spot images are provided without a radiologist present. Successful reduction of the superior dislocation of the prosthetic femoral head. IMPRESSION: Successful reduction of the superior dislocation of the prosthetic femoral head. Electronically Signed   By: Minerva Fester M.D.   On: 07/22/2022 22:09   DG C-Arm 1-60 Min-No Report  Result Date: 07/22/2022 Fluoroscopy was utilized by the requesting physician.  No radiographic interpretation.   DG Pelvis Portable  Result Date: 07/22/2022 CLINICAL DATA:  Post reduction EXAM: PORTABLE PELVIS 1-2 VIEWS COMPARISON:  07/22/2022 FINDINGS: Unchanged superior dislocation of the prosthetic femoral head relative to the prosthetic acetabulum. No evidence of regional fracture. Severe degenerative changes in the left hip, unchanged. IMPRESSION: Unchanged superior dislocation of the prosthetic femoral head relative to the prosthetic acetabulum. Electronically Signed   By: Wiliam Ke M.D.   On: 07/22/2022 12:26   CT Head Wo Contrast  Result Date: 07/22/2022 CLINICAL DATA:  Larey Seat with trauma to the head EXAM: CT HEAD WITHOUT CONTRAST TECHNIQUE: Contiguous axial images were obtained from the base of the skull through the vertex without intravenous contrast. RADIATION DOSE REDUCTION: This exam was performed according to the departmental  dose-optimization program which includes automated exposure control, adjustment of the mA and/or kV according to patient size and/or use of iterative reconstruction technique. COMPARISON:  06/07/2022 FINDINGS: Brain: Chronic brain volume loss. Chronic small-vessel ischemic changes of the white matter. 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 No sign of acute infarction, mass lesion, hemorrhage, hydrocephalus or extra-axial collection. Vascular: There is atherosclerotic calcification of the major vessels at the base of the brain. Skull: No skull fracture. Sinuses/Orbits: No acute sinus finding. Retention cyst right maxillary sinus. Orbits negative. Other: None IMPRESSION: No acute or traumatic finding. Chronic brain volume loss and chronic small-vessel ischemic changes of the white matter. 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 Electronically Signed   By: Paulina Fusi M.D.   On: 07/22/2022 09:44   DG Chest Portable 1 View  Result Date: 07/22/2022 CLINICAL DATA:  Dizziness.  Head and neck cancer. EXAM: PORTABLE CHEST 1 VIEW COMPARISON:  05/11/2022 FINDINGS: Power injectable right IJ Port-A-Cath tip: Upper SVC. Clips noted in the neck bilaterally. Borderline elevated right hemidiaphragm. Minimal blunting of the left lateral costophrenic angle. The patient is rotated to the left on today's radiograph, reducing diagnostic sensitivity and specificity. Glenoid spurring bilaterally. Heart size within normal limits. IMPRESSION: 1. Minimal blunting of the left lateral costophrenic angle, potentially from pleural thickening or trace pleural fluid. 2. Borderline elevated right hemidiaphragm. 3. Glenoid spurring bilaterally. Electronically Signed   By: Gaylyn Rong M.D.   On: 07/22/2022 09:43   DG Pelvis 1-2 Views  Result Date: 07/22/2022 CLINICAL DATA:  Fall with pain EXAM: PELVIS - 1-2 VIEW COMPARISON:  01/12/2022 FINDINGS: Superior dislocation of the  prosthetic femoral head relative to the prosthetic acetabulum. No evidence of regional fracture. Chronic osteoarthritis of the left hip as seen previously. IMPRESSION: Superior dislocation of the prosthetic femoral head relative to the prosthetic acetabulum. Electronically Signed   By: Paulina Fusi M.D.   On: 07/22/2022 09:36       LOS: 1 day   [name]  [facility] Pager on [email or web address]  07/23/2022, 11:55 AM

## 2022-07-23 NOTE — Evaluation (Signed)
Physical Therapy Evaluation Patient Details Name: [name] MRN: [id number] DOB: Jun 18, 1965 Today's Date: 07/23/2022  History of Present Illness  The pt is a 57 yo male presenting 7/3 after a fall at home with pain in R hip. Pt found to have R hip dislocation, s/p closed reduction 7/3. PMH includes: throat cancer, DM II, GAD, anemia, polysubstance abuse, R transmetatarsal amputation, bilateral TKA, and R THA.   Clinical Impression  Pt in bed upon arrival of PT, agreeable to evaluation at this time. Prior to admission the pt was mobilizing with use of RW in his home, reports he was still active in community, driving and running errands for himself. The pt reports he will be able to d/c with his son's assist at home when medically ready, and has various DME available. Pt with significant limitations in LE strength and power as well as needing assist to steady in standing and with management of RW for pivot transfers. The pt maintained TDWB with RLE despite being WBAT, will continue to progress wt acceptance and independence with transfers prior to return home with assist from son.      Assistance Recommended at Discharge Frequent or constant Supervision/Assistance  If plan is discharge home, recommend the following:  Can travel by private vehicle  A lot of help with walking and/or transfers;A lot of help with bathing/dressing/bathroom;Assistance with cooking/housework;Assist for transportation;Help with stairs or ramp for entrance        Equipment Recommendations None recommended by PT  Recommendations for Other Services       Functional Status Assessment Patient has had a recent decline in their functional status and demonstrates the ability to make significant improvements in function in a reasonable and predictable amount of time.     Precautions / Restrictions Precautions Precautions: Fall Restrictions Weight Bearing Restrictions: Yes RLE Weight Bearing: Weight bearing as tolerated       Mobility  Bed Mobility Overal bed mobility: Needs Assistance Bed Mobility: Supine to Sit     Supine to sit: Min assist     General bed mobility comments: minA to manage RLE, increased time, pt using bed rail    Transfers Overall transfer level: Needs assistance Equipment used: Rolling walker (2 wheels) Transfers: Sit to/from Stand, Bed to chair/wheelchair/BSC Sit to Stand: Mod assist, From elevated surface   Step pivot transfers: Mod assist       General transfer comment: modA to power up from elevated bed, pt with minimal wt on RLE. modA for balance and management of RW for pivot    Ambulation/Gait               General Gait Details: limited to pivotal steps at this time. minimal wt on RLE, heavy dependence on RW but needing assist to manage and maintain in close proximity    Balance Overall balance assessment: Needs assistance Sitting-balance support: Bilateral upper extremity supported, Feet supported Sitting balance-Leahy Scale: Fair     Standing balance support: Bilateral upper extremity supported, During functional activity, Reliant on assistive device for balance Standing balance-Leahy Scale: Poor Standing balance comment: BUE support + modA external support                             Pertinent Vitals/Pain Pain Assessment Pain Assessment: 0-10 Pain Score: 10-Worst pain ever Pain Location: R hip Pain Descriptors / Indicators: Discomfort Pain Intervention(s): Limited activity within patient's tolerance, Monitored during session, RN gave pain meds during session  Home Living Family/patient expects to be discharged to:: Private residence Living Arrangements: Children (son) Available Help at Discharge: Family Type of Home: House Home Access: Ramped entrance       Home Layout: One level Home Equipment: Grab bars - toilet;Grab bars - tub/shower;Rolling Walker (2 wheels);Cane - single point;Shower seat;BSC/3in1;Wheelchair -  manual      Prior Function Prior Level of Function : Independent/Modified Independent;Driving             Mobility Comments: uses RW, reports no other falls ADLs Comments: reports independent with driving, errands, and IADLs, uses shower seat     Hand Dominance   Dominant Hand: Right    Extremity/Trunk Assessment   Upper Extremity Assessment Upper Extremity Assessment: Generalized weakness    Lower Extremity Assessment Lower Extremity Assessment: RLE deficits/detail;LLE deficits/detail;Generalized weakness RLE Deficits / Details: limited AROM due to pain. pt reports sensation intact. chronic transmet amputation. RLE: Unable to fully assess due to pain;Unable to fully assess due to immobilization RLE Sensation: WNL LLE Deficits / Details: limited power and muscle bulk. ptabl eto complete grossly 4/5 to MMT, limited ability to move his own weight LLE Sensation: WNL LLE Coordination: WNL    Cervical / Trunk Assessment Cervical / Trunk Assessment: Normal  Communication   Communication: Expressive difficulties (uses white board due to hx of throat cancer)  Cognition Arousal/Alertness: Awake/alert Behavior During Therapy: WFL for tasks assessed/performed Overall Cognitive Status: Within Functional Limits for tasks assessed                                 General Comments: pt able to answer simple questions, perseverating on hunger        General Comments General comments (skin integrity, edema, etc.): VSS on RA, pt using white board for communication        Assessment/Plan    PT Assessment Patient needs continued PT services  PT Problem List Decreased range of motion;Decreased strength;Decreased activity tolerance;Decreased balance;Decreased mobility;Decreased safety awareness;Pain       PT Treatment Interventions DME instruction;Functional mobility training;Stair training;Gait training;Therapeutic activities;Therapeutic exercise;Balance  training;Neuromuscular re-education;Patient/family education    PT Goals (Current goals can be found in the Care Plan section)  Acute Rehab PT Goals Patient Stated Goal: reduce pain, return home PT Goal Formulation: With patient Time For Goal Achievement: 08/06/22 Potential to Achieve Goals: Good    Frequency Min 3X/week        AM-PAC PT "6 Clicks" Mobility  Outcome Measure Help needed turning from your back to your side while in a flat bed without using bedrails?: A Little Help needed moving from lying on your back to sitting on the side of a flat bed without using bedrails?: A Little Help needed moving to and from a bed to a chair (including a wheelchair)?: A Lot Help needed standing up from a chair using your arms (e.g., wheelchair or bedside chair)?: A Lot Help needed to walk in hospital room?: A Lot Help needed climbing 3-5 steps with a railing? : A Lot 6 Click Score: 14    End of Session Equipment Utilized During Treatment: Gait belt Activity Tolerance: Patient tolerated treatment well;Patient limited by fatigue Patient left: in chair;with call bell/phone within reach;with chair alarm set Nurse Communication: Mobility status PT Visit Diagnosis: Other abnormalities of gait and mobility (R26.89);Muscle weakness (generalized) (M62.81);Unsteadiness on feet (R26.81);Pain Pain - Right/Left: Right Pain - part of body: Hip    Time: [id number]  PT Time Calculation (min) (ACUTE ONLY): 38 min   Charges:   PT Evaluation $PT Eval Low Complexity: 1 Low PT Treatments $Therapeutic Exercise: 8-22 mins $Therapeutic Activity: 8-22 mins PT General Charges $$ ACUTE PT VISIT: 1 Visit         Vickki Muff, PT, DPT   Acute Rehabilitation Department Office [phone] Secure Chat Communication Preferred  [name] 07/23/2022, 10:58 AM

## 2022-07-23 NOTE — Anesthesia Postprocedure Evaluation (Signed)
Anesthesia Post Note  Patient: Phat Rebar  Procedure(s) Performed: CLOSED REDUCTION HIP (Right: Hip)     Patient location during evaluation: PACU Anesthesia Type: General Level of consciousness: awake and alert Pain management: pain level controlled Vital Signs Assessment: post-procedure vital signs reviewed and stable Respiratory status: spontaneous breathing, nonlabored ventilation and respiratory function stable Cardiovascular status: blood pressure returned to baseline and stable Postop Assessment: no apparent nausea or vomiting Anesthetic complications: no   No notable events documented.  Last Vitals:  Vitals:   07/23/22 1509 07/23/22 1950  BP:  102/68  Pulse: 76 80  Resp: 18 18  Temp:  36.6 C  SpO2: 100% 95%    Last Pain:  Vitals:   07/23/22 1950  TempSrc: Oral  PainSc:                  Collene Schlichter

## 2022-07-23 NOTE — TOC CAGE-AID Note (Signed)
Transition of Care [facility]) - CAGE-AID Screening   Patient Details  Name: [name] MRN: [id number] Date of Birth: 1965/07/28  Transition of Care [facility]) CM/SW Contact:    Katha Hamming, RN Phone Number: 07/23/2022, 8:23 PM   CAGE-AID Screening:    Have You Ever Felt You Ought to Cut Down on Your Drinking or Drug Use?: No Have People Annoyed You By Critizing Your Drinking Or Drug Use?: No Have You Felt Bad Or Guilty About Your Drinking Or Drug Use?: No Have You Ever Had a Drink or Used Drugs First Thing In The Morning to Steady Your Nerves or to Get Rid of a Hangover?: No CAGE-AID Score: 0  Substance Abuse Education Offered: No    Denies drug/alcohol use

## 2022-07-23 NOTE — Care Management Obs Status (Signed)
MEDICARE OBSERVATION STATUS NOTIFICATION   Patient Details  Name: [name] MRN: [id number] Date of Birth: 06/02/1965   Medicare Observation Status Notification Given:  Yes    Epifanio Lesches, RN 07/23/2022, 12:30 PM

## 2022-07-23 NOTE — Progress Notes (Signed)
Orthopaedic Trauma Service Progress Note Holiday cross coverage    Patient ID: [name] MRN: [id number] DOB/AGE: September 10, 1965 57 y.o.  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 As to who can help him at dc. Conflicting reports about who he lives with   Uses walker at baseline around the house. Does not ambulate far  ROS As above  Objective:   VITALS:   Vitals:   07/23/22 0446 07/23/22 0447 07/23/22 0821 07/23/22 1206  BP:  (!) 102/52 102/62   Pulse: 92 (!) 48 91 89  Resp:  14 17 (!) 21  Temp:  98.6 F (37 C) 98.5 F (36.9 C)   TempSrc:  Oral Oral   SpO2:  91% 100% 98%  Weight:        Estimated body mass index is 18.44 kg/m as calculated from the following:   Height as of 01/15/22: 6\' 3"  (1.905 m).   Weight as of this encounter: 66.9 kg.   Intake/Output      07/03 0701 07/04 0700 07/04 0701 07/05 0700   I.V. (mL/kg) 1100 (16.4)    Other 0    Total Intake(mL/kg) 1100 (16.4)    Urine (mL/kg/hr) 3325    Blood 0    Total Output 3325    Net -2225           LABS  Results for orders placed or performed during the hospital encounter of 07/22/22 (from the past 24 hour(s))  Urinalysis, Routine w reflex microscopic -Urine, Clean Catch     Status: Abnormal   Collection Time: 07/22/22  4:29 PM  Result Value Ref Range   Color, Urine YELLOW YELLOW   APPearance CLEAR CLEAR   Specific Gravity, Urine 1.012 1.005 - 1.030   pH 5.0 5.0 - 8.0   Glucose, UA NEGATIVE NEGATIVE mg/dL   Hgb urine dipstick NEGATIVE NEGATIVE   Bilirubin Urine NEGATIVE NEGATIVE   Ketones, ur 5 (A) NEGATIVE mg/dL   Protein, ur NEGATIVE NEGATIVE mg/dL   Nitrite NEGATIVE NEGATIVE   Leukocytes,Ua NEGATIVE NEGATIVE  Surgical pcr screen     Status: Abnormal   Collection Time: 07/22/22  8:27 PM   Specimen: Nasal Mucosa;  Nasal Swab  Result Value Ref Range   MRSA, PCR POSITIVE (A) NEGATIVE   Staphylococcus aureus POSITIVE (A) NEGATIVE  Glucose, capillary     Status: Abnormal   Collection Time: 07/22/22 10:03 PM  Result Value Ref Range   Glucose-Capillary 68 (L) 70 - 99 mg/dL  Glucose, capillary     Status: Abnormal   Collection Time: 07/22/22 10:32 PM  Result Value Ref Range   Glucose-Capillary 132 (H) 70 - 99 mg/dL  Glucose, capillary     Status: Abnormal   Collection Time: 07/22/22 10:55 PM  Result Value Ref Range   Glucose-Capillary 131 (H) 70 - 99 mg/dL  Glucose, capillary     Status: None   Collection Time: 07/23/22  8:34 AM  Result Value Ref Range   Glucose-Capillary 82 70 - 99 mg/dL  Glucose, capillary     Status: Abnormal   Collection Time: 07/23/22 11:59 AM  Result Value Ref Range   Glucose-Capillary 175 (H) 70 - 99 mg/dL  PHYSICAL EXAM:   Gen: resting comfortably in bed, frail appearing male Ext:       Right Lower extremity   Knee immobilizer in place  Ext warm   Moderate chronic muscle wasting   Rotation and leg length appear appropriate    Assessment/Plan: 1 Day Post-Op     Anti-infectives (From admission, onward)    None     .  POD/HD#: 1  57 y/o male with R prosthetic hip dislocation s/p closed reduction in OR    Weightbearing: WBAT R leg in knee immobilizer  Insicional and dressing care: skin checks q shift of R leg, open immobilizer and eval soft tissue Pain management: multimodal   Orthopedic device(s):  walker and knee immobilizer   VTE prophylaxis: per medicine. Does not require at dc from ortho standpoint    Follow - up plan:total joint surgeon at [facility] in next 7-10 days    Mearl Latin, PA-C [phone] (C) 07/23/2022, 1:59 PM  [facility] [address] [place] [address] [phone] Collier Bullock (F)    After 5pm and on the weekends please log on to Amion, go to orthopaedics and the look  under the [facility] Call for the provider(s) on call. You can also call our office at [phone] and then follow the prompts to be connected to the call team.  Patient ID: [name], male   DOB: 1965-12-16, 57 y.o.   MRN: [id number]

## 2022-07-23 NOTE — Progress Notes (Addendum)
Hypoglycemic Event  CBG: 50  Treatment: 8 oz juice/soda  Symptoms: None  Follow-up CBG: 79 Time: 1629  Possible Reasons for Event: Unknown    Melane Windholz

## 2022-07-24 DIAGNOSIS — S73004A Unspecified dislocation of right hip, initial encounter: Secondary | ICD-10-CM | POA: Diagnosis not present

## 2022-07-24 DIAGNOSIS — Z7401 Bed confinement status: Secondary | ICD-10-CM | POA: Diagnosis not present

## 2022-07-24 DIAGNOSIS — R531 Weakness: Secondary | ICD-10-CM | POA: Diagnosis not present

## 2022-07-24 LAB — CBC
HCT: 27.7 % — ABNORMAL LOW (ref 39.0–52.0)
Hemoglobin: 8.8 g/dL — ABNORMAL LOW (ref 13.0–17.0)
MCH: 27.8 pg (ref 26.0–34.0)
MCHC: 31.8 g/dL (ref 30.0–36.0)
MCV: 87.4 fL (ref 80.0–100.0)
Platelets: 196 10*3/uL (ref 150–400)
RBC: 3.17 MIL/uL — ABNORMAL LOW (ref 4.22–5.81)
RDW: 13.2 % (ref 11.5–15.5)
WBC: 3.9 10*3/uL — ABNORMAL LOW (ref 4.0–10.5)
nRBC: 0 % (ref 0.0–0.2)

## 2022-07-24 LAB — GLUCOSE, CAPILLARY
Glucose-Capillary: 103 mg/dL — ABNORMAL HIGH (ref 70–99)
Glucose-Capillary: 126 mg/dL — ABNORMAL HIGH (ref 70–99)

## 2022-07-24 LAB — BASIC METABOLIC PANEL
Anion gap: 8 (ref 5–15)
BUN: 15 mg/dL (ref 6–20)
CO2: 25 mmol/L (ref 22–32)
Calcium: 8.6 mg/dL — ABNORMAL LOW (ref 8.9–10.3)
Chloride: 102 mmol/L (ref 98–111)
Creatinine, Ser: 0.92 mg/dL (ref 0.61–1.24)
GFR, Estimated: >60 mL/min (ref >60)
Glucose, Bld: 125 mg/dL — ABNORMAL HIGH (ref 70–99)
Potassium: 3.5 mmol/L (ref 3.5–5.1)
Sodium: 135 mmol/L (ref 135–145)

## 2022-07-24 MED ORDER — SENNOSIDES-DOCUSATE SODIUM 8.6-50 MG PO TABS: 2.0000 | ORAL_TABLET | Freq: Every day | ORAL | 1 refills | Status: AC

## 2022-07-24 MED ORDER — HEPARIN SOD (PORK) LOCK FLUSH 100 UNIT/ML IV SOLN
500.0000 [IU] | INTRAVENOUS | Status: AC | PRN
  Administered 2022-07-24: 500 [IU]
  Filled 2022-07-24: qty 5

## 2022-07-24 NOTE — Progress Notes (Signed)
Physical Therapy Treatment Patient Details Name: [name] MRN: [id number] DOB: Jan 14, 1966 Today's Date: 07/24/2022   History of Present Illness The pt is a 57 yo male presenting 7/3 after a fall at home with pain in R hip. Pt found to have R hip dislocation, s/p closed reduction 7/3. PMH includes: throat cancer, DM II, GAD, anemia, polysubstance abuse, R transmetatarsal amputation, bilateral TKA, and R THA.    PT Comments  The pt was agreeable to session, able to demo slight improvement in ability to complete sit-stand transfers and progress to taking short steps with assist. The pt was the most independent with lateral scoot transfers, completing transfer from chair-bed and bed-chair with minG. Therefore, pt can be safe to d/c home at a [facility] level with his son's assist and prior DME, but will benefit from continued skilled PT to progress functional strength in BLE for transfers and stability in stance. Pt needing assist today to manage L knee as it frequently goes into hyperextension with stance and is dependent on BUE and external assist to rise to standing with LLE. Pt also placing minimal wt on RLE at this time, will continue to benefit from skilled PT to progress gait pattern and independence.      Assistance Recommended at Discharge Frequent or constant Supervision/Assistance  If plan is discharge home, recommend the following:  Can travel by private vehicle    A lot of help with walking and/or transfers;A lot of help with bathing/dressing/bathroom;Assistance with cooking/housework;Assist for transportation;Help with stairs or ramp for entrance      Equipment Recommendations  None recommended by PT    Recommendations for Other Services       Precautions / Restrictions Precautions Precautions: Fall Restrictions Weight Bearing Restrictions: Yes RLE Weight Bearing: Weight bearing as tolerated     Mobility  Bed Mobility Overal bed mobility: Needs Assistance Bed Mobility: Supine  to Sit     Supine to sit: Min guard     General bed mobility comments: pt using bed rail, but no assist needed for LE or scooting to EOB    Transfers Overall transfer level: Needs assistance Equipment used: Rolling walker (2 wheels) Transfers: Sit to/from Stand, Bed to chair/wheelchair/BSC Sit to Stand: Mod assist, From elevated surface   Step pivot transfers: Mod assist      Lateral/Scoot Transfers: Min guard General transfer comment: modA to power up from elevated surface due to poor power and strength in LLE. able to complete short bout of walking to reach chair with modA to stabilize LLE and advance RLE. pt able to scoot laterally from drop arm chair to bed and back with minG.    Ambulation/Gait Ambulation/Gait assistance: Mod assist Gait Distance (Feet): 4 Feet Assistive device: Rolling walker (2 wheels) Gait Pattern/deviations: Knee hyperextension - left, Decreased stance time - right, Decreased weight shift to right, Decreased stride length, Step-to pattern Gait velocity: decreased Gait velocity interpretation: <1.31 ft/sec, indicative of household ambulator   General Gait Details: small steps with assist to stabilize LLE, pt tends towards hyperextension, and assist to advance and position RLE due to decreased coordination.      Balance Overall balance assessment: Needs assistance Sitting-balance support: Bilateral upper extremity supported, Feet supported Sitting balance-Leahy Scale: Fair     Standing balance support: Bilateral upper extremity supported, During functional activity, Reliant on assistive device for balance Standing balance-Leahy Scale: Poor Standing balance comment: BUE support + modA external support  Cognition Arousal/Alertness: Awake/alert Behavior During Therapy: WFL for tasks assessed/performed Overall Cognitive Status: Within Functional Limits for tasks assessed                                  General Comments: pt able to answer simple questions, lethargic but improved with mobility. suspect due to pain medicine        Exercises      General Comments General comments (skin integrity, edema, etc.): VSS on RA despite changes in position and pt's lethargy      Pertinent Vitals/Pain Pain Assessment Pain Assessment: Faces Faces Pain Scale: Hurts even more Pain Location: R hip Pain Descriptors / Indicators: Discomfort Pain Intervention(s): Limited activity within patient's tolerance, Premedicated before session, Monitored during session, Repositioned     PT Goals (current goals can now be found in the care plan section) Acute Rehab PT Goals Patient Stated Goal: reduce pain, return home PT Goal Formulation: With patient Time For Goal Achievement: 08/06/22 Potential to Achieve Goals: Good Progress towards PT goals: Progressing toward goals    Frequency    Min 3X/week      PT Plan Current plan remains appropriate       AM-PAC PT "6 Clicks" Mobility   Outcome Measure  Help needed turning from your back to your side while in a flat bed without using bedrails?: A Little Help needed moving from lying on your back to sitting on the side of a flat bed without using bedrails?: A Little Help needed moving to and from a bed to a chair (including a wheelchair)?: A Lot Help needed standing up from a chair using your arms (e.g., wheelchair or bedside chair)?: A Lot Help needed to walk in hospital room?: Total (<20 ft) Help needed climbing 3-5 steps with a railing? : Total 6 Click Score: 12    End of Session Equipment Utilized During Treatment: Gait belt Activity Tolerance: Patient tolerated treatment well;Patient limited by fatigue Patient left: in chair;with call bell/phone within reach;with chair alarm set Nurse Communication: Mobility status PT Visit Diagnosis: Other abnormalities of gait and mobility (R26.89);Muscle weakness (generalized)  (M62.81);Unsteadiness on feet (R26.81);Pain Pain - Right/Left: Right Pain - part of body: Hip     Time: 6045-4098 PT Time Calculation (min) (ACUTE ONLY): 27 min  Charges:    $Gait Training: 8-22 mins $Therapeutic Exercise: 8-22 mins PT General Charges $$ ACUTE PT VISIT: 1 Visit                     Vickki Muff, PT, DPT   Acute Rehabilitation Department Office [phone] Secure Chat Communication Preferred   Ronnie Derby 07/24/2022, 9:19 AM

## 2022-07-24 NOTE — TOC Transition Note (Signed)
Transition of Care [facility]) - CM/SW Discharge Note   Patient Details  Name: [name] MRN: [id number] Date of Birth: August 16, 1965  [facility]) CM/SW Contact:  Epifanio Lesches, RN Phone Number: 07/24/2022, 11:33 AM   Clinical Narrative:    Patient will DC to: home Anticipated DC date: 07/24/2022 Family notified: yes Transport by: Sharin Mons       - R prosthetic hip dislocation s/p closed reduction  Per MD patient ready for DC today. RN, patient, and patient's son notified of DC. Orders noted for home health services. Pt/son agreeable to home health services and no preference provider. Referral made with [facility] and accepted. RX meds, pt /son with concerns. Post hospital follow up noted on AVS. Pt without transportation home.  PTAR/ambulance transport requested for patient Son to received pt @ home. Address confirmed with pt son. Alquan Terrill(Son)[phone]  RNCM will sign off for now as intervention is no longer needed. Please consult [place] again if new needs arise.      RNCM will sign off for now as intervention is no longer needed. Please consult [place] again if new needs arise.    Final next level of care: Home w Home Health Services Barriers to Discharge: No Barriers Identified   Patient Goals and CMS Choice   Choice offered to / list presented to : Adult Children  Discharge Placement                         Discharge Plan and Services Additional resources added to the After Visit Summary for                            [facility] Arranged: PT HH Agency: [facility] Date [facility] Agency Contacted: 07/24/22 Time HH Agency Contacted: 1133 Representative spoke with at [facility] Agency: Haywood Lasso  Social Determinants of Health (SDOH) Interventions SDOH Screenings   Tobacco Use: Medium Risk (07/23/2022)     Readmission Risk Interventions     No data to display

## 2022-07-24 NOTE — Discharge Summary (Signed)
[facility]  Physician Discharge Summary   Patient ID: [name] MRN: [id number] DOB/AGE: [birth year] 59 y.o.  Admit date: 07/22/2022 Discharge date:   ***  PCP: Benita Stabile, MD  DISCHARGE DIAGNOSES:  Principal Problem:   Closed traumatic dislocation of right hip joint (HCC) Active Problems:   Alcohol dependence (HCC)   Dislocation, hip, right, subsequent encounter   Generalized anxiety disorder   Diabetes mellitus type 2 in nonobese (HCC)   Anemia   Dislocated hip (HCC)   RECOMMENDATIONS FOR OUTPATIENT FOLLOW UP: *** (include homehealth, outpatient follow-up instructions, specific recommendations for PCP to follow-up on, etc.)   Home Health:***  Equipment/Devices:***   CODE STATUS:***   DISCHARGE CONDITION: {condition:18240}  Diet recommendation: ***  INITIAL HISTORY: ***  Consultations: ***  Procedures: *** (i.e. Studies not automatically included, echos, thoracentesis, etc; not x-rays)  HOSPITAL COURSE: ***         Estimated body mass index is 18.44 kg/m as calculated from the following:   Height as of 01/15/22: 6\' 3"  (1.905 m).   Weight as of this encounter: 66.9 kg.              PERTINENT LABS:  The results of significant diagnostics from this hospitalization (including imaging, microbiology, ancillary and laboratory) are listed below for reference.    Microbiology: Recent Results (from the past 240 hour(s))  Surgical pcr screen     Status: Abnormal   Collection Time: 07/22/22  8:27 PM   Specimen: Nasal Mucosa; Nasal Swab  Result Value Ref Range Status   MRSA, PCR POSITIVE (A) NEGATIVE Final    Comment: RESULT CALLED TO, READ BACK BY AND VERIFIED WITH: RN KIM [facility] ON 07/22/22 @ 2225 BY DRT    Staphylococcus aureus POSITIVE (A) NEGATIVE Final    Comment: (NOTE) The Xpert SA Assay (FDA approved for NASAL specimens in patients 91 years of age and older), is one component of a comprehensive surveillance program. It is not  intended to diagnose infection nor to guide or monitor treatment. Performed at [facility], [address], [place], [place] [address]      Labs:   Basic Metabolic Panel: Recent Labs  Lab 07/22/22 0920 07/24/22 0345  NA 133* 135  K 4.1 3.5  CL 99 102  CO2 23 25  GLUCOSE 97 125*  BUN 22* 15  CREATININE 1.15 0.92  CALCIUM 8.7* 8.6*   Liver Function Tests: Recent Labs  Lab 07/22/22 0920  AST 36  ALT 22  ALKPHOS 75  BILITOT 1.0  PROT 7.0  ALBUMIN 3.7   No results for input(s): "LIPASE", "AMYLASE" in the last 168 hours. No results for input(s): "AMMONIA" in the last 168 hours. CBC: Recent Labs  Lab 07/22/22 0920 07/24/22 0345  WBC 10.3 3.9*  NEUTROABS 9.2*  --   HGB 10.8* 8.8*  HCT 33.7* 27.7*  MCV 87.8 87.4  PLT 229 196   Cardiac Enzymes: No results for input(s): "CKTOTAL", "CKMB", "CKMBINDEX", "TROPONINI" in the last 168 hours. BNP: BNP (last 3 results) No results for input(s): "BNP" in the last 8760 hours.  ProBNP (last 3 results) No results for input(s): "PROBNP" in the last 8760 hours.  CBG: Recent Labs  Lab 07/23/22 1159 07/23/22 1602 07/23/22 1629 07/23/22 2040 07/24/22 0738  GLUCAP 175* 50* 79 141* 103*     IMAGING STUDIES DG HIP UNILAT WITH PELVIS 2-3 VIEWS RIGHT  Result Date: 07/22/2022 CLINICAL DATA:  Status post right hip reduction EXAM: DG HIP (WITH OR  WITHOUT PELVIS) 2-3V RIGHT COMPARISON:  Films from earlier in the same day. FINDINGS: Femoral component has been reduced into the acetabular component. No acute fracture is seen. IMPRESSION: Status post close reduction of right hip prosthesis. Electronically Signed   By: Alcide Clever M.D.   On: 07/22/2022 22:31   DG HIP UNILAT WITH PELVIS 1V RIGHT  Result Date: 07/22/2022 CLINICAL DATA:  Right prosthetic hip dislocation reduction EXAM: DG HIP (WITH OR WITHOUT PELVIS) 1V RIGHT FLUOROSCOPY TIME:  Fluoroscopy Time:  7.5 seconds Radiation Exposure Index (if provided by the  fluoroscopic device): 0.73 mGy Number of Acquired Spot Images: 2 COMPARISON:  07/22/2022 at 12:05 p.m. FINDINGS: Multiple intraoperative fluoroscopic spot images are provided without a radiologist present. Successful reduction of the superior dislocation of the prosthetic femoral head. IMPRESSION: Successful reduction of the superior dislocation of the prosthetic femoral head. Electronically Signed   By: Minerva Fester M.D.   On: 07/22/2022 22:09   DG C-Arm 1-60 Min-No Report  Result Date: 07/22/2022 Fluoroscopy was utilized by the requesting physician.  No radiographic interpretation.   DG Pelvis Portable  Result Date: 07/22/2022 CLINICAL DATA:  Post reduction EXAM: PORTABLE PELVIS 1-2 VIEWS COMPARISON:  07/22/2022 FINDINGS: Unchanged superior dislocation of the prosthetic femoral head relative to the prosthetic acetabulum. No evidence of regional fracture. Severe degenerative changes in the left hip, unchanged. IMPRESSION: Unchanged superior dislocation of the prosthetic femoral head relative to the prosthetic acetabulum. Electronically Signed   By: Wiliam Ke M.D.   On: 07/22/2022 12:26   CT Head Wo Contrast  Result Date: 07/22/2022 CLINICAL DATA:  Larey Seat with trauma to the head EXAM: CT HEAD WITHOUT CONTRAST TECHNIQUE: Contiguous axial images were obtained from the base of the skull through the vertex without intravenous contrast. RADIATION DOSE REDUCTION: This exam was performed according to the departmental dose-optimization program which includes automated exposure control, adjustment of the mA and/or kV according to patient size and/or use of iterative reconstruction technique. COMPARISON:  06/07/2022 FINDINGS: Brain: Chronic brain volume loss. Chronic small-vessel ischemic changes of the white matter. 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 No sign of acute infarction, mass lesion, hemorrhage, hydrocephalus or extra-axial collection. Vascular: There is atherosclerotic  calcification of the major vessels at the base of the brain. Skull: No skull fracture. Sinuses/Orbits: No acute sinus finding. Retention cyst right maxillary sinus. Orbits negative. Other: None IMPRESSION: No acute or traumatic finding. Chronic brain volume loss and chronic small-vessel ischemic changes of the white matter. 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 Electronically Signed   By: Paulina Fusi M.D.   On: 07/22/2022 09:44   DG Chest Portable 1 View  Result Date: 07/22/2022 CLINICAL DATA:  Dizziness.  Head and neck cancer. EXAM: PORTABLE CHEST 1 VIEW COMPARISON:  05/11/2022 FINDINGS: Power injectable right IJ Port-A-Cath tip: Upper SVC. Clips noted in the neck bilaterally. Borderline elevated right hemidiaphragm. Minimal blunting of the left lateral costophrenic angle. The patient is rotated to the left on today's radiograph, reducing diagnostic sensitivity and specificity. Glenoid spurring bilaterally. Heart size within normal limits. IMPRESSION: 1. Minimal blunting of the left lateral costophrenic angle, potentially from pleural thickening or trace pleural fluid. 2. Borderline elevated right hemidiaphragm. 3. Glenoid spurring bilaterally. Electronically Signed   By: Gaylyn Rong M.D.   On: 07/22/2022 09:43   DG Pelvis 1-2 Views  Result Date: 07/22/2022 CLINICAL DATA:  Fall with pain EXAM: PELVIS - 1-2 VIEW COMPARISON:  01/12/2022 FINDINGS: Superior dislocation of  the prosthetic femoral head relative to the prosthetic acetabulum. No evidence of regional fracture. Chronic osteoarthritis of the left hip as seen previously. IMPRESSION: Superior dislocation of the prosthetic femoral head relative to the prosthetic acetabulum. Electronically Signed   By: Paulina Fusi M.D.   On: 07/22/2022 09:36    DISCHARGE EXAMINATION: Vitals:   07/24/22 0024 07/24/22 0256 07/24/22 0534 07/24/22 0839  BP:      Pulse: 78  89 88  Resp: 16 15 18 18   Temp:   97.8 F (36.6 C)    TempSrc:      SpO2: 96%  100% 100%  Weight:       {physical exam:3041130}  DISPOSITION: ***  Discharge Instructions     Call MD for:  difficulty breathing, headache or visual disturbances   Complete by: As directed    Call MD for:  extreme fatigue   Complete by: As directed    Call MD for:  persistant dizziness or light-headedness   Complete by: As directed    Call MD for:  persistant nausea and vomiting   Complete by: As directed    Call MD for:  severe uncontrolled pain   Complete by: As directed    Call MD for:  temperature >100.4   Complete by: As directed    Discharge instructions   Complete by: As directed    Please be sure to follow-up with your orthopedic surgeon at [facility] in 7 to 10 days as instructed by orthopedics here.  Take your medications as prescribed.  You were cared for by a hospitalist during your hospital stay. If you have any questions about your discharge medications or the care you received while you were in the hospital after you are discharged, you can call the unit and asked to speak with the hospitalist on call if the hospitalist that took care of you is not available. Once you are discharged, your primary care physician will handle any further medical issues. Please note that NO REFILLS for any discharge medications will be authorized once you are discharged, as it is imperative that you return to your primary care physician (or establish a relationship with a primary care physician if you do not have one) for your aftercare needs so that they can reassess your need for medications and monitor your lab values. If you do not have a primary care physician, you can call [phone] for a physician referral.   Increase activity slowly   Complete by: As directed         Current Inpatient Medications: {medication reviewed/display:3041432}  Allergies as of 07/24/2022       Reactions   Benadryl [diphenhydramine Hcl] Other (See Comments)   Leg spasms     Desyrel [trazodone] Other (See Comments)   Leg spasms        Medication List     TAKE these medications    feeding supplement (GLUCERNA SHAKE) Liqd Take 237 mLs by mouth 3 (three) times daily between meals.   levothyroxine 75 MCG tablet Commonly known as: SYNTHROID Take 75 mcg by mouth daily.   LORazepam 1 MG tablet Commonly known as: ATIVAN Take 1 mg by mouth 3 (three) times daily.   metFORMIN 500 MG tablet Commonly known as: GLUCOPHAGE Take 500 mg by mouth 2 (two) times daily.   oxyCODONE 15 MG immediate release tablet Commonly known as: ROXICODONE Take 15 mg by mouth every 4 (four) hours as needed for pain.   QUEtiapine 300 MG tablet Commonly known as:  SEROQUEL Take 300 mg by mouth at bedtime.   senna-docusate 8.6-50 MG tablet Commonly known as: Senokot-S Take 2 tablets by mouth at bedtime. What changed:  how much to take when to take this   tamsulosin 0.4 MG Caps capsule Commonly known as: FLOMAX Take 0.4 mg by mouth daily.          Follow-up Information     Orthopedic Surgeon at [facility]. Schedule an appointment as soon as possible for a visit.                  TOTAL DISCHARGE TIME: ***  Osvaldo Shipper  [facility] Pager on [email or web address]  07/24/2022, 8:51 AM

## 2022-07-24 NOTE — Progress Notes (Signed)
Pt d/c per MD order. IV's removed, went over AVS, answered all questions. Pt left via [facility].

## 2022-07-27 ENCOUNTER — Encounter (HOSPITAL_COMMUNITY): Payer: Self-pay | Admitting: Internal Medicine

## 2022-07-27 DIAGNOSIS — Z990 Dependence on aspirator: Secondary | ICD-10-CM | POA: Diagnosis not present

## 2022-07-27 DIAGNOSIS — Z930 Tracheostomy status: Secondary | ICD-10-CM | POA: Diagnosis not present

## 2022-07-27 DIAGNOSIS — Z9002 Acquired absence of larynx: Secondary | ICD-10-CM | POA: Diagnosis not present

## 2022-08-04 DIAGNOSIS — Z452 Encounter for adjustment and management of vascular access device: Secondary | ICD-10-CM | POA: Diagnosis not present

## 2022-08-05 MED FILL — Fentanyl Citrate Preservative Free (PF) Inj 100 MCG/2ML: INTRAMUSCULAR | Qty: 2 | Status: AC

## 2022-08-18 DIAGNOSIS — E119 Type 2 diabetes mellitus without complications: Secondary | ICD-10-CM | POA: Diagnosis not present

## 2022-08-18 DIAGNOSIS — D509 Iron deficiency anemia, unspecified: Secondary | ICD-10-CM | POA: Diagnosis not present

## 2022-08-18 DIAGNOSIS — I10 Essential (primary) hypertension: Secondary | ICD-10-CM | POA: Diagnosis not present

## 2022-08-18 DIAGNOSIS — E039 Hypothyroidism, unspecified: Secondary | ICD-10-CM | POA: Diagnosis not present

## 2022-08-19 DIAGNOSIS — Z96641 Presence of right artificial hip joint: Secondary | ICD-10-CM | POA: Diagnosis not present

## 2022-08-19 DIAGNOSIS — E119 Type 2 diabetes mellitus without complications: Secondary | ICD-10-CM | POA: Diagnosis not present

## 2022-08-19 DIAGNOSIS — T85528A Displacement of other gastrointestinal prosthetic devices, implants and grafts, initial encounter: Secondary | ICD-10-CM | POA: Diagnosis not present

## 2022-08-19 DIAGNOSIS — Z87891 Personal history of nicotine dependence: Secondary | ICD-10-CM | POA: Diagnosis not present

## 2022-08-19 DIAGNOSIS — Z931 Gastrostomy status: Secondary | ICD-10-CM | POA: Diagnosis not present

## 2022-08-19 DIAGNOSIS — K9423 Gastrostomy malfunction: Secondary | ICD-10-CM | POA: Diagnosis not present

## 2022-08-19 DIAGNOSIS — R1084 Generalized abdominal pain: Secondary | ICD-10-CM | POA: Diagnosis not present

## 2022-08-27 DIAGNOSIS — Z930 Tracheostomy status: Secondary | ICD-10-CM | POA: Diagnosis not present

## 2022-08-27 DIAGNOSIS — Z990 Dependence on aspirator: Secondary | ICD-10-CM | POA: Diagnosis not present

## 2022-08-27 DIAGNOSIS — Z9002 Acquired absence of larynx: Secondary | ICD-10-CM | POA: Diagnosis not present

## 2022-09-20 DEATH — deceased

## 2022-10-01 ENCOUNTER — Telehealth: Payer: Self-pay | Admitting: *Deleted

## 2022-10-01 NOTE — Progress Notes (Signed)
Care Coordination  Outreach Note  10/01/2022 Name: [name]. MRN: [id number] DOB: [birth year]   Care Coordination Outreach Attempts: An unsuccessful telephone outreach was attempted today to offer the patient information about available care coordination services.  Follow Up Plan:  Additional outreach attempts will be made to offer the patient care coordination information and services.   Encounter Outcome:  No Answer   Gwenevere Ghazi  Care Coordination Care Guide  Direct Dial: [phone]

## 2022-10-06 NOTE — Progress Notes (Signed)
Care Coordination  Outreach Note  10/06/2022 Name: Krystle Maheshwari. MRN: [id number] DOB: Jun 02, 1965   Care Coordination Outreach Attempts: A second unsuccessful outreach was attempted today to offer the patient with information about available care coordination services.  Follow Up Plan:  Additional outreach attempts will be made to offer the patient care coordination information and services.   Encounter Outcome:  No Answer  Gwenevere Ghazi  Care Coordination Care Guide  Direct Dial: [phone]

## 2022-10-12 NOTE — Progress Notes (Signed)
Care Coordination  Outreach Note  10/12/2022 Name: [name]. MRN: [id number] DOB: 1965-11-06   Care Coordination Outreach Attempts: A third unsuccessful outreach was attempted today to offer the patient with information about available care coordination services.  Follow Up Plan:  No further outreach attempts will be made at this time. We have been unable to contact the patient to offer or enroll patient in care coordination services  Encounter Outcome:  No Answer  Gwenevere Ghazi  Care Coordination Care Guide  Direct Dial: [phone]
